# Patient Record
Sex: Female | Born: 1935
Health system: Southern US, Community
[De-identification: ages and names within clinical notes are randomized; demographics above are authoritative.]

## PROBLEM LIST (undated history)

## (undated) DIAGNOSIS — I10 Essential (primary) hypertension: Secondary | ICD-10-CM

## (undated) DIAGNOSIS — J45909 Unspecified asthma, uncomplicated: Secondary | ICD-10-CM

## (undated) DIAGNOSIS — E669 Obesity, unspecified: Secondary | ICD-10-CM

## (undated) DIAGNOSIS — E039 Hypothyroidism, unspecified: Secondary | ICD-10-CM

## (undated) DIAGNOSIS — I2699 Other pulmonary embolism without acute cor pulmonale: Secondary | ICD-10-CM

## (undated) DIAGNOSIS — M419 Scoliosis, unspecified: Secondary | ICD-10-CM

## (undated) DIAGNOSIS — G4733 Obstructive sleep apnea (adult) (pediatric): Secondary | ICD-10-CM

## (undated) DIAGNOSIS — F411 Generalized anxiety disorder: Secondary | ICD-10-CM

## (undated) DIAGNOSIS — E119 Type 2 diabetes mellitus without complications: Secondary | ICD-10-CM

## (undated) DIAGNOSIS — I509 Heart failure, unspecified: Secondary | ICD-10-CM

## (undated) DIAGNOSIS — Z9989 Dependence on other enabling machines and devices: Secondary | ICD-10-CM

## (undated) DIAGNOSIS — F3289 Other specified depressive episodes: Secondary | ICD-10-CM

## (undated) DIAGNOSIS — J9691 Respiratory failure, unspecified with hypoxia: Secondary | ICD-10-CM

## (undated) DIAGNOSIS — E7800 Pure hypercholesterolemia, unspecified: Secondary | ICD-10-CM

## (undated) DIAGNOSIS — J189 Pneumonia, unspecified organism: Secondary | ICD-10-CM

## (undated) DIAGNOSIS — F329 Major depressive disorder, single episode, unspecified: Secondary | ICD-10-CM

## (undated) DIAGNOSIS — R55 Syncope and collapse: Secondary | ICD-10-CM

## (undated) HISTORY — DX: Respiratory failure, unspecified with hypoxia: J96.91

## (undated) HISTORY — DX: Unspecified asthma, uncomplicated: J45.909

## (undated) HISTORY — PX: KIDNEY STONE SURGERY: SHX686

## (undated) HISTORY — DX: Other specified depressive episodes: F32.89

## (undated) HISTORY — DX: Syncope and collapse: R55

## (undated) HISTORY — DX: Hypothyroidism, unspecified: E03.9

## (undated) HISTORY — DX: Dependence on other enabling machines and devices: Z99.89

## (undated) HISTORY — DX: Generalized anxiety disorder: F41.1

## (undated) HISTORY — DX: Pure hypercholesterolemia, unspecified: E78.00

## (undated) HISTORY — PX: JOINT REPLACEMENT: SHX530

## (undated) HISTORY — DX: Scoliosis, unspecified: M41.9

## (undated) HISTORY — PX: THYROID SURGERY: SHX805

## (undated) HISTORY — DX: Obesity, unspecified: E66.9

## (undated) HISTORY — DX: Major depressive disorder, single episode, unspecified: F32.9

## (undated) HISTORY — DX: Obstructive sleep apnea (adult) (pediatric): G47.33

## (undated) HISTORY — DX: Type 2 diabetes mellitus without complications: E11.9

## (undated) HISTORY — PX: EYE SURGERY: SHX253

## (undated) HISTORY — DX: Essential (primary) hypertension: I10

## (undated) HISTORY — PX: OTHER SURGICAL HISTORY: SHX169

## (undated) HISTORY — DX: Pneumonia, unspecified organism: J18.9

---

## 1964-09-07 HISTORY — PX: THYROID SURGERY: SHX805

## 1999-06-24 ENCOUNTER — Encounter: Admission: RE | Admit: 1999-06-24 | Discharge: 1999-09-22 | Payer: Self-pay | Admitting: Endocrinology

## 2003-09-08 HISTORY — PX: EYE SURGERY: SHX253

## 2004-01-16 ENCOUNTER — Encounter: Admission: RE | Admit: 2004-01-16 | Discharge: 2004-01-16 | Payer: Self-pay | Admitting: *Deleted

## 2005-04-08 ENCOUNTER — Encounter: Admission: RE | Admit: 2005-04-08 | Discharge: 2005-07-07 | Payer: Self-pay | Admitting: Emergency Medicine

## 2005-07-22 ENCOUNTER — Encounter: Admission: RE | Admit: 2005-07-22 | Discharge: 2005-10-20 | Payer: Self-pay | Admitting: Physician Assistant

## 2005-11-10 ENCOUNTER — Encounter: Admission: RE | Admit: 2005-11-10 | Discharge: 2006-02-08 | Payer: Self-pay | Admitting: Physician Assistant

## 2006-05-20 ENCOUNTER — Ambulatory Visit: Payer: Self-pay | Admitting: Family Medicine

## 2006-09-06 ENCOUNTER — Ambulatory Visit: Payer: Self-pay | Admitting: Gastroenterology

## 2006-09-15 ENCOUNTER — Encounter: Admission: RE | Admit: 2006-09-15 | Discharge: 2006-09-15 | Payer: Self-pay | Admitting: Emergency Medicine

## 2006-10-08 ENCOUNTER — Ambulatory Visit: Payer: Self-pay | Admitting: Gastroenterology

## 2006-11-05 LAB — CONVERTED CEMR LAB: Pap Smear: NORMAL

## 2007-03-28 ENCOUNTER — Encounter: Admission: RE | Admit: 2007-03-28 | Discharge: 2007-03-28 | Payer: Self-pay | Admitting: Emergency Medicine

## 2007-06-30 ENCOUNTER — Ambulatory Visit: Payer: Self-pay | Admitting: Family Medicine

## 2007-07-12 ENCOUNTER — Ambulatory Visit: Payer: Self-pay | Admitting: Family Medicine

## 2007-07-15 ENCOUNTER — Encounter: Payer: Self-pay | Admitting: Family Medicine

## 2007-07-19 ENCOUNTER — Ambulatory Visit: Payer: Self-pay | Admitting: Family Medicine

## 2007-07-26 ENCOUNTER — Ambulatory Visit: Payer: Self-pay | Admitting: Family Medicine

## 2007-08-02 ENCOUNTER — Ambulatory Visit: Payer: Self-pay | Admitting: Family Medicine

## 2007-08-23 ENCOUNTER — Ambulatory Visit: Payer: Self-pay | Admitting: Family Medicine

## 2007-09-06 ENCOUNTER — Ambulatory Visit: Payer: Self-pay | Admitting: Family Medicine

## 2007-09-20 ENCOUNTER — Ambulatory Visit: Payer: Self-pay | Admitting: Family Medicine

## 2007-10-07 LAB — HM COLONOSCOPY: HM Colonoscopy: NORMAL

## 2007-10-13 ENCOUNTER — Ambulatory Visit: Payer: Self-pay | Admitting: Family Medicine

## 2007-10-18 ENCOUNTER — Ambulatory Visit: Payer: Self-pay | Admitting: Family Medicine

## 2007-11-01 ENCOUNTER — Ambulatory Visit: Payer: Self-pay | Admitting: Family Medicine

## 2008-04-10 ENCOUNTER — Ambulatory Visit: Payer: Self-pay | Admitting: Family Medicine

## 2008-04-10 ENCOUNTER — Encounter: Payer: Self-pay | Admitting: Pulmonary Disease

## 2008-04-10 ENCOUNTER — Encounter (INDEPENDENT_AMBULATORY_CARE_PROVIDER_SITE_OTHER): Payer: Self-pay | Admitting: *Deleted

## 2008-04-11 ENCOUNTER — Encounter (INDEPENDENT_AMBULATORY_CARE_PROVIDER_SITE_OTHER): Payer: Self-pay | Admitting: *Deleted

## 2008-04-20 ENCOUNTER — Encounter (INDEPENDENT_AMBULATORY_CARE_PROVIDER_SITE_OTHER): Payer: Self-pay | Admitting: *Deleted

## 2008-04-22 ENCOUNTER — Encounter (INDEPENDENT_AMBULATORY_CARE_PROVIDER_SITE_OTHER): Payer: Self-pay | Admitting: *Deleted

## 2008-09-07 DIAGNOSIS — J9691 Respiratory failure, unspecified with hypoxia: Secondary | ICD-10-CM

## 2008-09-07 HISTORY — DX: Respiratory failure, unspecified with hypoxia: J96.91

## 2008-09-12 ENCOUNTER — Encounter (INDEPENDENT_AMBULATORY_CARE_PROVIDER_SITE_OTHER): Payer: Self-pay | Admitting: *Deleted

## 2008-09-27 ENCOUNTER — Ambulatory Visit: Payer: Self-pay | Admitting: Internal Medicine

## 2008-09-27 ENCOUNTER — Ambulatory Visit: Payer: Self-pay | Admitting: *Deleted

## 2008-09-27 ENCOUNTER — Ambulatory Visit: Payer: Self-pay | Admitting: Diagnostic Radiology

## 2008-09-27 ENCOUNTER — Encounter: Payer: Self-pay | Admitting: Emergency Medicine

## 2008-09-27 ENCOUNTER — Inpatient Hospital Stay (HOSPITAL_COMMUNITY): Admission: AD | Admit: 2008-09-27 | Discharge: 2008-10-05 | Payer: Self-pay | Admitting: Internal Medicine

## 2008-09-27 ENCOUNTER — Ambulatory Visit: Payer: Self-pay | Admitting: Cardiology

## 2008-09-27 DIAGNOSIS — R55 Syncope and collapse: Secondary | ICD-10-CM | POA: Insufficient documentation

## 2008-09-27 DIAGNOSIS — E039 Hypothyroidism, unspecified: Secondary | ICD-10-CM | POA: Insufficient documentation

## 2008-09-27 DIAGNOSIS — J309 Allergic rhinitis, unspecified: Secondary | ICD-10-CM | POA: Insufficient documentation

## 2008-09-27 DIAGNOSIS — F329 Major depressive disorder, single episode, unspecified: Secondary | ICD-10-CM

## 2008-09-27 DIAGNOSIS — R0989 Other specified symptoms and signs involving the circulatory and respiratory systems: Secondary | ICD-10-CM | POA: Insufficient documentation

## 2008-09-27 DIAGNOSIS — I10 Essential (primary) hypertension: Secondary | ICD-10-CM | POA: Insufficient documentation

## 2008-09-27 DIAGNOSIS — E7800 Pure hypercholesterolemia, unspecified: Secondary | ICD-10-CM | POA: Insufficient documentation

## 2008-09-27 DIAGNOSIS — J45909 Unspecified asthma, uncomplicated: Secondary | ICD-10-CM | POA: Insufficient documentation

## 2008-09-27 DIAGNOSIS — F339 Major depressive disorder, recurrent, unspecified: Secondary | ICD-10-CM | POA: Insufficient documentation

## 2008-09-27 DIAGNOSIS — R0609 Other forms of dyspnea: Secondary | ICD-10-CM

## 2008-09-27 DIAGNOSIS — F410 Panic disorder [episodic paroxysmal anxiety] without agoraphobia: Secondary | ICD-10-CM | POA: Insufficient documentation

## 2008-09-27 DIAGNOSIS — R0602 Shortness of breath: Secondary | ICD-10-CM | POA: Insufficient documentation

## 2008-09-27 DIAGNOSIS — E669 Obesity, unspecified: Secondary | ICD-10-CM | POA: Insufficient documentation

## 2008-09-28 ENCOUNTER — Ambulatory Visit: Payer: Self-pay | Admitting: Vascular Surgery

## 2008-09-28 ENCOUNTER — Encounter: Payer: Self-pay | Admitting: Internal Medicine

## 2008-10-05 ENCOUNTER — Encounter: Payer: Self-pay | Admitting: Internal Medicine

## 2008-10-11 ENCOUNTER — Ambulatory Visit: Payer: Self-pay | Admitting: Internal Medicine

## 2008-10-11 DIAGNOSIS — I2699 Other pulmonary embolism without acute cor pulmonale: Secondary | ICD-10-CM | POA: Insufficient documentation

## 2008-10-12 ENCOUNTER — Telehealth: Payer: Self-pay | Admitting: Internal Medicine

## 2008-10-15 ENCOUNTER — Encounter (INDEPENDENT_AMBULATORY_CARE_PROVIDER_SITE_OTHER): Payer: Self-pay | Admitting: *Deleted

## 2008-10-17 ENCOUNTER — Telehealth (INDEPENDENT_AMBULATORY_CARE_PROVIDER_SITE_OTHER): Payer: Self-pay | Admitting: *Deleted

## 2008-10-24 ENCOUNTER — Encounter (INDEPENDENT_AMBULATORY_CARE_PROVIDER_SITE_OTHER): Payer: Self-pay | Admitting: *Deleted

## 2008-10-26 ENCOUNTER — Ambulatory Visit: Payer: Self-pay | Admitting: *Deleted

## 2008-10-26 DIAGNOSIS — G4730 Sleep apnea, unspecified: Secondary | ICD-10-CM | POA: Insufficient documentation

## 2008-10-30 ENCOUNTER — Telehealth (INDEPENDENT_AMBULATORY_CARE_PROVIDER_SITE_OTHER): Payer: Self-pay | Admitting: *Deleted

## 2008-10-30 ENCOUNTER — Ambulatory Visit (HOSPITAL_BASED_OUTPATIENT_CLINIC_OR_DEPARTMENT_OTHER): Admission: RE | Admit: 2008-10-30 | Discharge: 2008-10-30 | Payer: Self-pay | Admitting: Internal Medicine

## 2008-10-30 ENCOUNTER — Ambulatory Visit: Payer: Self-pay | Admitting: Diagnostic Radiology

## 2008-11-01 ENCOUNTER — Telehealth (INDEPENDENT_AMBULATORY_CARE_PROVIDER_SITE_OTHER): Payer: Self-pay | Admitting: *Deleted

## 2008-11-08 ENCOUNTER — Ambulatory Visit: Payer: Self-pay | Admitting: *Deleted

## 2008-11-08 DIAGNOSIS — R609 Edema, unspecified: Secondary | ICD-10-CM | POA: Insufficient documentation

## 2008-11-08 LAB — CONVERTED CEMR LAB
BUN: 21 mg/dL (ref 6–23)
CO2: 32 meq/L (ref 19–32)
Calcium: 9.3 mg/dL (ref 8.4–10.5)
Chloride: 104 meq/L (ref 96–112)
Creatinine, Ser: 0.9 mg/dL (ref 0.4–1.2)
GFR calc Af Amer: 79 mL/min
GFR calc non Af Amer: 66 mL/min
Glucose, Bld: 99 mg/dL (ref 70–99)
Potassium: 4.1 meq/L (ref 3.5–5.1)
Sodium: 141 meq/L (ref 135–145)
TSH: 3.84 microintl units/mL (ref 0.35–5.50)

## 2008-11-15 ENCOUNTER — Telehealth (INDEPENDENT_AMBULATORY_CARE_PROVIDER_SITE_OTHER): Payer: Self-pay | Admitting: *Deleted

## 2008-11-15 ENCOUNTER — Ambulatory Visit: Payer: Self-pay | Admitting: Critical Care Medicine

## 2008-11-21 ENCOUNTER — Encounter (INDEPENDENT_AMBULATORY_CARE_PROVIDER_SITE_OTHER): Payer: Self-pay | Admitting: *Deleted

## 2008-11-23 ENCOUNTER — Telehealth (INDEPENDENT_AMBULATORY_CARE_PROVIDER_SITE_OTHER): Payer: Self-pay | Admitting: *Deleted

## 2008-11-23 DIAGNOSIS — R0902 Hypoxemia: Secondary | ICD-10-CM | POA: Insufficient documentation

## 2008-12-02 ENCOUNTER — Encounter: Payer: Self-pay | Admitting: Critical Care Medicine

## 2008-12-13 ENCOUNTER — Ambulatory Visit: Payer: Self-pay | Admitting: *Deleted

## 2008-12-13 DIAGNOSIS — F411 Generalized anxiety disorder: Secondary | ICD-10-CM | POA: Insufficient documentation

## 2008-12-17 ENCOUNTER — Ambulatory Visit: Payer: Self-pay | Admitting: Pulmonary Disease

## 2008-12-21 ENCOUNTER — Encounter: Payer: Self-pay | Admitting: Pulmonary Disease

## 2009-01-01 ENCOUNTER — Telehealth: Payer: Self-pay | Admitting: Critical Care Medicine

## 2009-01-07 ENCOUNTER — Ambulatory Visit: Payer: Self-pay | Admitting: Internal Medicine

## 2009-01-07 LAB — CONVERTED CEMR LAB
ALT: 25 units/L (ref 0–35)
AST: 31 units/L (ref 0–37)
Albumin: 3.9 g/dL (ref 3.5–5.2)
Alkaline Phosphatase: 57 units/L (ref 39–117)
BUN: 19 mg/dL (ref 6–23)
Bilirubin, Direct: 0 mg/dL (ref 0.0–0.3)
CO2: 29 meq/L (ref 19–32)
Calcium: 9.2 mg/dL (ref 8.4–10.5)
Chloride: 106 meq/L (ref 96–112)
Cholesterol: 351 mg/dL — ABNORMAL HIGH (ref 0–200)
Creatinine, Ser: 1 mg/dL (ref 0.4–1.2)
Creatinine,U: 66.8 mg/dL
Direct LDL: 201.1 mg/dL
GFR calc non Af Amer: 58.02 mL/min (ref >60)
Glucose, Bld: 158 mg/dL — ABNORMAL HIGH (ref 70–99)
HDL: 41.4 mg/dL (ref >39.00)
Hgb A1c MFr Bld: 7 % — ABNORMAL HIGH (ref 4.6–6.5)
Microalb Creat Ratio: 16.5 mg/g (ref 0.0–30.0)
Microalb, Ur: 1.1 mg/dL (ref 0.0–1.9)
Potassium: 4.3 meq/L (ref 3.5–5.1)
Sodium: 141 meq/L (ref 135–145)
TSH: 2.46 microintl units/mL (ref 0.35–5.50)
Total Bilirubin: 0.9 mg/dL (ref 0.3–1.2)
Total CHOL/HDL Ratio: 8
Total Protein: 7.8 g/dL (ref 6.0–8.3)
Triglycerides: 487 mg/dL — ABNORMAL HIGH (ref 0.0–149.0)
VLDL: 97.4 mg/dL — ABNORMAL HIGH (ref 0.0–40.0)
Vit D, 1,25-Dihydroxy: 20 — ABNORMAL LOW (ref 30–89)

## 2009-01-10 ENCOUNTER — Telehealth: Payer: Self-pay | Admitting: Internal Medicine

## 2009-01-17 ENCOUNTER — Telehealth: Payer: Self-pay | Admitting: Internal Medicine

## 2009-01-22 ENCOUNTER — Telehealth: Payer: Self-pay | Admitting: Internal Medicine

## 2009-01-28 ENCOUNTER — Encounter: Payer: Self-pay | Admitting: Internal Medicine

## 2009-01-30 ENCOUNTER — Encounter: Payer: Self-pay | Admitting: Pulmonary Disease

## 2009-02-05 ENCOUNTER — Ambulatory Visit: Payer: Self-pay | Admitting: Critical Care Medicine

## 2009-02-12 ENCOUNTER — Encounter: Payer: Self-pay | Admitting: Internal Medicine

## 2009-02-14 ENCOUNTER — Ambulatory Visit: Payer: Self-pay | Admitting: Internal Medicine

## 2009-02-14 LAB — CONVERTED CEMR LAB
BUN: 31 mg/dL — ABNORMAL HIGH (ref 6–23)
CO2: 30 meq/L (ref 19–32)
Calcium: 9 mg/dL (ref 8.4–10.5)
Chloride: 102 meq/L (ref 96–112)
Creatinine, Ser: 1.2 mg/dL (ref 0.4–1.2)
GFR calc non Af Amer: 47 mL/min (ref >60)
Glucose, Bld: 127 mg/dL — ABNORMAL HIGH (ref 70–99)
Potassium: 4 meq/L (ref 3.5–5.1)
Sodium: 140 meq/L (ref 135–145)

## 2009-02-18 ENCOUNTER — Ambulatory Visit: Payer: Self-pay | Admitting: Internal Medicine

## 2009-02-19 ENCOUNTER — Encounter: Payer: Self-pay | Admitting: Internal Medicine

## 2009-03-04 ENCOUNTER — Telehealth: Payer: Self-pay | Admitting: Internal Medicine

## 2009-03-05 ENCOUNTER — Ambulatory Visit: Payer: Self-pay | Admitting: Pulmonary Disease

## 2009-03-06 ENCOUNTER — Ambulatory Visit: Payer: Self-pay | Admitting: Hematology & Oncology

## 2009-03-08 ENCOUNTER — Encounter: Payer: Self-pay | Admitting: Critical Care Medicine

## 2009-03-12 ENCOUNTER — Encounter: Payer: Self-pay | Admitting: Critical Care Medicine

## 2009-03-18 ENCOUNTER — Telehealth (INDEPENDENT_AMBULATORY_CARE_PROVIDER_SITE_OTHER): Payer: Self-pay | Admitting: *Deleted

## 2009-03-22 ENCOUNTER — Ambulatory Visit (HOSPITAL_BASED_OUTPATIENT_CLINIC_OR_DEPARTMENT_OTHER): Admission: RE | Admit: 2009-03-22 | Discharge: 2009-03-22 | Payer: Self-pay | Admitting: Hematology & Oncology

## 2009-03-22 ENCOUNTER — Ambulatory Visit: Payer: Self-pay | Admitting: Family Medicine

## 2009-03-22 ENCOUNTER — Ambulatory Visit: Payer: Self-pay | Admitting: Diagnostic Radiology

## 2009-03-23 ENCOUNTER — Ambulatory Visit: Payer: Self-pay | Admitting: Hematology

## 2009-03-29 LAB — PROTIME-INR
INR: 2.9 (ref 2.00–3.50)
Protime: 34.8 Seconds — ABNORMAL HIGH (ref 10.6–13.4)

## 2009-04-05 LAB — PROTIME-INR

## 2009-04-05 LAB — PROTHROMBIN TIME
INR: 4.7 — ABNORMAL HIGH (ref 0.0–1.5)
Prothrombin Time: 48.3 seconds — ABNORMAL HIGH (ref 11.6–15.2)

## 2009-04-08 LAB — PROTIME-INR
INR: 2.5 (ref 2.00–3.50)
Protime: 30 Seconds — ABNORMAL HIGH (ref 10.6–13.4)

## 2009-04-09 ENCOUNTER — Ambulatory Visit: Payer: Self-pay | Admitting: Critical Care Medicine

## 2009-04-10 ENCOUNTER — Telehealth: Payer: Self-pay | Admitting: Internal Medicine

## 2009-04-15 LAB — PROTIME-INR
INR: 2.7 (ref 2.00–3.50)
Protime: 32.4 Seconds — ABNORMAL HIGH (ref 10.6–13.4)

## 2009-04-19 ENCOUNTER — Ambulatory Visit: Payer: Self-pay | Admitting: Hematology & Oncology

## 2009-04-22 LAB — PROTIME-INR (CHCC SATELLITE)
INR: 3.2 (ref 2.0–3.5)
Protime: 38.4 Seconds — ABNORMAL HIGH (ref 10.6–13.4)

## 2009-04-29 ENCOUNTER — Ambulatory Visit: Payer: Self-pay | Admitting: Hematology

## 2009-04-29 LAB — PROTIME-INR
INR: 3.7 — ABNORMAL HIGH (ref 2.00–3.50)
Protime: 44.4 Seconds — ABNORMAL HIGH (ref 10.6–13.4)

## 2009-05-06 ENCOUNTER — Encounter: Payer: Self-pay | Admitting: Critical Care Medicine

## 2009-05-06 LAB — CBC WITH DIFFERENTIAL (CANCER CENTER ONLY)
BASO#: 0 10*3/uL (ref 0.0–0.2)
BASO%: 0.5 % (ref 0.0–2.0)
EOS%: 4.4 % (ref 0.0–7.0)
Eosinophils Absolute: 0.2 10*3/uL (ref 0.0–0.5)
HCT: 33.3 % — ABNORMAL LOW (ref 34.8–46.6)
HGB: 11.3 g/dL — ABNORMAL LOW (ref 11.6–15.9)
LYMPH#: 1.3 10*3/uL (ref 0.9–3.3)
LYMPH%: 23.6 % (ref 14.0–48.0)
MCH: 30.7 pg (ref 26.0–34.0)
MCHC: 34 g/dL (ref 32.0–36.0)
MCV: 90 fL (ref 81–101)
MONO#: 0.4 10*3/uL (ref 0.1–0.9)
MONO%: 6.6 % (ref 0.0–13.0)
NEUT#: 3.5 10*3/uL (ref 1.5–6.5)
NEUT%: 64.9 % (ref 39.6–80.0)
Platelets: 186 10*3/uL (ref 145–400)
RBC: 3.69 10*6/uL — ABNORMAL LOW (ref 3.70–5.32)
RDW: 15.3 % — ABNORMAL HIGH (ref 10.5–14.6)
WBC: 5.4 10*3/uL (ref 3.9–10.0)

## 2009-05-06 LAB — PROTIME-INR (CHCC SATELLITE)
INR: 2.9 (ref 2.0–3.5)
Protime: 34.8 Seconds — ABNORMAL HIGH (ref 10.6–13.4)

## 2009-05-07 ENCOUNTER — Telehealth: Payer: Self-pay | Admitting: Internal Medicine

## 2009-05-20 ENCOUNTER — Ambulatory Visit: Payer: Self-pay | Admitting: Hematology & Oncology

## 2009-05-20 ENCOUNTER — Ambulatory Visit: Payer: Self-pay | Admitting: Internal Medicine

## 2009-05-20 LAB — PROTIME-INR (CHCC SATELLITE)
INR: 2.8 (ref 2.0–3.5)
Protime: 33.6 Seconds — ABNORMAL HIGH (ref 10.6–13.4)

## 2009-05-21 ENCOUNTER — Ambulatory Visit: Payer: Self-pay | Admitting: Internal Medicine

## 2009-05-21 LAB — CONVERTED CEMR LAB

## 2009-05-24 ENCOUNTER — Encounter: Payer: Self-pay | Admitting: Internal Medicine

## 2009-05-27 ENCOUNTER — Encounter: Payer: Self-pay | Admitting: Internal Medicine

## 2009-05-28 ENCOUNTER — Encounter: Payer: Self-pay | Admitting: Internal Medicine

## 2009-05-31 ENCOUNTER — Ambulatory Visit: Payer: Self-pay | Admitting: Pulmonary Disease

## 2009-06-03 LAB — PROTIME-INR (CHCC SATELLITE)
INR: 3.2 (ref 2.0–3.5)
Protime: 38.4 Seconds — ABNORMAL HIGH (ref 10.6–13.4)

## 2009-06-04 ENCOUNTER — Ambulatory Visit: Payer: Self-pay | Admitting: Critical Care Medicine

## 2009-06-11 ENCOUNTER — Telehealth: Payer: Self-pay | Admitting: Internal Medicine

## 2009-06-13 ENCOUNTER — Ambulatory Visit: Payer: Self-pay | Admitting: Internal Medicine

## 2009-06-13 DIAGNOSIS — IMO0001 Reserved for inherently not codable concepts without codable children: Secondary | ICD-10-CM | POA: Insufficient documentation

## 2009-06-13 DIAGNOSIS — M549 Dorsalgia, unspecified: Secondary | ICD-10-CM | POA: Insufficient documentation

## 2009-06-13 LAB — CONVERTED CEMR LAB: Total CK: 75 units/L (ref 7–177)

## 2009-06-14 ENCOUNTER — Telehealth: Payer: Self-pay | Admitting: Internal Medicine

## 2009-06-17 ENCOUNTER — Encounter: Payer: Self-pay | Admitting: Critical Care Medicine

## 2009-06-17 LAB — PROTIME-INR (CHCC SATELLITE)
INR: 3.2 (ref 2.0–3.5)
Protime: 38.4 Seconds — ABNORMAL HIGH (ref 10.6–13.4)

## 2009-06-17 LAB — CBC WITH DIFFERENTIAL (CANCER CENTER ONLY)
BASO#: 0 10*3/uL (ref 0.0–0.2)
BASO%: 0.6 % (ref 0.0–2.0)
EOS%: 4.4 % (ref 0.0–7.0)
Eosinophils Absolute: 0.2 10*3/uL (ref 0.0–0.5)
HCT: 35.9 % (ref 34.8–46.6)
HGB: 12.1 g/dL (ref 11.6–15.9)
LYMPH#: 1.4 10*3/uL (ref 0.9–3.3)
LYMPH%: 25.1 % (ref 14.0–48.0)
MCH: 30.4 pg (ref 26.0–34.0)
MCHC: 33.6 g/dL (ref 32.0–36.0)
MCV: 91 fL (ref 81–101)
MONO#: 0.5 10*3/uL (ref 0.1–0.9)
MONO%: 8.4 % (ref 0.0–13.0)
NEUT#: 3.3 10*3/uL (ref 1.5–6.5)
NEUT%: 61.5 % (ref 39.6–80.0)
Platelets: 181 10*3/uL (ref 145–400)
RBC: 3.96 10*6/uL (ref 3.70–5.32)
RDW: 15 % — ABNORMAL HIGH (ref 10.5–14.6)
WBC: 5.4 10*3/uL (ref 3.9–10.0)

## 2009-07-03 ENCOUNTER — Ambulatory Visit: Payer: Self-pay | Admitting: Hematology & Oncology

## 2009-07-03 ENCOUNTER — Encounter: Payer: Self-pay | Admitting: Internal Medicine

## 2009-07-15 ENCOUNTER — Encounter: Payer: Self-pay | Admitting: Internal Medicine

## 2009-07-15 LAB — PROTIME-INR (CHCC SATELLITE)
INR: 2.8 (ref 2.0–3.5)
Protime: 33.6 Seconds — ABNORMAL HIGH (ref 10.6–13.4)

## 2009-08-07 LAB — HM DIABETES EYE EXAM: HM Diabetic Eye Exam: NORMAL

## 2009-08-09 ENCOUNTER — Ambulatory Visit: Payer: Self-pay | Admitting: Hematology & Oncology

## 2009-08-12 ENCOUNTER — Encounter: Payer: Self-pay | Admitting: Critical Care Medicine

## 2009-08-12 LAB — PROTIME-INR (CHCC SATELLITE)
INR: 3.2 (ref 2.0–3.5)
Protime: 38.4 Seconds — ABNORMAL HIGH (ref 10.6–13.4)

## 2009-08-16 ENCOUNTER — Ambulatory Visit: Payer: Self-pay | Admitting: Internal Medicine

## 2009-08-16 LAB — CONVERTED CEMR LAB
ALT: 25 units/L (ref 0–35)
AST: 32 units/L (ref 0–37)
Albumin: 3.9 g/dL (ref 3.5–5.2)
Alkaline Phosphatase: 55 units/L (ref 39–117)
BUN: 22 mg/dL (ref 6–23)
Bilirubin, Direct: 0.1 mg/dL (ref 0.0–0.3)
CO2: 28 meq/L (ref 19–32)
Calcium: 9 mg/dL (ref 8.4–10.5)
Chloride: 104 meq/L (ref 96–112)
Cholesterol: 148 mg/dL (ref 0–200)
Creatinine, Ser: 1 mg/dL (ref 0.4–1.2)
GFR calc non Af Amer: 57.92 mL/min (ref >60)
Glucose, Bld: 148 mg/dL — ABNORMAL HIGH (ref 70–99)
HDL: 44.8 mg/dL (ref >39.00)
Hgb A1c MFr Bld: 6.8 % — ABNORMAL HIGH (ref 4.6–6.5)
LDL Cholesterol: 65 mg/dL (ref 0–99)
Potassium: 4.1 meq/L (ref 3.5–5.1)
Sodium: 140 meq/L (ref 135–145)
Total Bilirubin: 0.8 mg/dL (ref 0.3–1.2)
Total CHOL/HDL Ratio: 3
Total Protein: 7.3 g/dL (ref 6.0–8.3)
Triglycerides: 190 mg/dL — ABNORMAL HIGH (ref 0.0–149.0)
VLDL: 38 mg/dL (ref 0.0–40.0)

## 2009-08-19 ENCOUNTER — Ambulatory Visit: Payer: Self-pay | Admitting: Internal Medicine

## 2009-08-19 LAB — HM DIABETES FOOT EXAM

## 2009-08-20 ENCOUNTER — Encounter: Admission: RE | Admit: 2009-08-20 | Discharge: 2009-08-20 | Payer: Self-pay | Admitting: Hematology & Oncology

## 2009-09-13 ENCOUNTER — Ambulatory Visit: Payer: Self-pay | Admitting: Hematology & Oncology

## 2009-09-20 ENCOUNTER — Encounter: Payer: Self-pay | Admitting: Internal Medicine

## 2009-09-20 LAB — CBC WITH DIFFERENTIAL (CANCER CENTER ONLY)
BASO#: 0 10e3/uL (ref 0.0–0.2)
BASO%: 0.6 % (ref 0.0–2.0)
EOS%: 4 % (ref 0.0–7.0)
Eosinophils Absolute: 0.2 10e3/uL (ref 0.0–0.5)
HCT: 36 % (ref 34.8–46.6)
HGB: 12.1 g/dL (ref 11.6–15.9)
LYMPH#: 1.3 10e3/uL (ref 0.9–3.3)
LYMPH%: 24.1 % (ref 14.0–48.0)
MCH: 30 pg (ref 26.0–34.0)
MCHC: 33.7 g/dL (ref 32.0–36.0)
MCV: 89 fL (ref 81–101)
MONO#: 0.4 10e3/uL (ref 0.1–0.9)
MONO%: 7.3 % (ref 0.0–13.0)
NEUT#: 3.4 10e3/uL (ref 1.5–6.5)
NEUT%: 64 % (ref 39.6–80.0)
Platelets: 190 10e3/uL (ref 145–400)
RBC: 4.03 10e6/uL (ref 3.70–5.32)
RDW: 14.9 % — ABNORMAL HIGH (ref 10.5–14.6)
WBC: 5.3 10e3/uL (ref 3.9–10.0)

## 2009-09-20 LAB — PROTIME-INR (CHCC SATELLITE)
INR: 1.8 — ABNORMAL LOW (ref 2.0–3.5)
Protime: 21.6 s — ABNORMAL HIGH (ref 10.6–13.4)

## 2009-09-23 ENCOUNTER — Ambulatory Visit: Payer: Self-pay | Admitting: Internal Medicine

## 2009-09-23 DIAGNOSIS — J069 Acute upper respiratory infection, unspecified: Secondary | ICD-10-CM | POA: Insufficient documentation

## 2009-09-27 ENCOUNTER — Telehealth: Payer: Self-pay | Admitting: Internal Medicine

## 2009-09-30 ENCOUNTER — Ambulatory Visit (HOSPITAL_BASED_OUTPATIENT_CLINIC_OR_DEPARTMENT_OTHER): Admission: RE | Admit: 2009-09-30 | Discharge: 2009-09-30 | Payer: Self-pay | Admitting: Internal Medicine

## 2009-09-30 ENCOUNTER — Ambulatory Visit: Payer: Self-pay | Admitting: Internal Medicine

## 2009-09-30 ENCOUNTER — Ambulatory Visit: Payer: Self-pay | Admitting: Radiology

## 2009-09-30 DIAGNOSIS — R05 Cough: Secondary | ICD-10-CM

## 2009-09-30 DIAGNOSIS — R059 Cough, unspecified: Secondary | ICD-10-CM | POA: Insufficient documentation

## 2009-09-30 LAB — CBC WITH DIFFERENTIAL (CANCER CENTER ONLY)
BASO#: 0 10*3/uL (ref 0.0–0.2)
BASO%: 0.5 % (ref 0.0–2.0)
EOS%: 4.3 % (ref 0.0–7.0)
Eosinophils Absolute: 0.3 10*3/uL (ref 0.0–0.5)
HCT: 34.6 % — ABNORMAL LOW (ref 34.8–46.6)
HGB: 11.6 g/dL (ref 11.6–15.9)
LYMPH#: 1.7 10*3/uL (ref 0.9–3.3)
LYMPH%: 21.6 % (ref 14.0–48.0)
MCH: 30.2 pg (ref 26.0–34.0)
MCHC: 33.6 g/dL (ref 32.0–36.0)
MCV: 90 fL (ref 81–101)
MONO#: 0.5 10*3/uL (ref 0.1–0.9)
MONO%: 7 % (ref 0.0–13.0)
NEUT#: 5.1 10*3/uL (ref 1.5–6.5)
NEUT%: 66.6 % (ref 39.6–80.0)
Platelets: 222 10*3/uL (ref 145–400)
RBC: 3.85 10*6/uL (ref 3.70–5.32)
RDW: 15.1 % — ABNORMAL HIGH (ref 10.5–14.6)
WBC: 7.7 10*3/uL (ref 3.9–10.0)

## 2009-09-30 LAB — PROTIME-INR (CHCC SATELLITE)
INR: 1.9 — ABNORMAL LOW (ref 2.0–3.5)
Protime: 22.8 Seconds — ABNORMAL HIGH (ref 10.6–13.4)

## 2009-10-03 ENCOUNTER — Telehealth: Payer: Self-pay | Admitting: Internal Medicine

## 2009-10-08 LAB — PROTIME-INR (CHCC SATELLITE)
INR: 1.7 — ABNORMAL LOW (ref 2.0–3.5)
Protime: 20.4 Seconds — ABNORMAL HIGH (ref 10.6–13.4)

## 2009-10-21 ENCOUNTER — Ambulatory Visit: Payer: Self-pay | Admitting: Hematology & Oncology

## 2009-10-21 LAB — PROTIME-INR (CHCC SATELLITE)
INR: 2.3 (ref 2.0–3.5)
Protime: 27.6 Seconds — ABNORMAL HIGH (ref 10.6–13.4)

## 2009-10-22 LAB — HM MAMMOGRAPHY: HM Mammogram: NORMAL

## 2009-11-11 ENCOUNTER — Encounter: Payer: Self-pay | Admitting: Internal Medicine

## 2009-11-14 LAB — PROTIME-INR (CHCC SATELLITE)
INR: 2.7 (ref 2.0–3.5)
Protime: 32.4 Seconds — ABNORMAL HIGH (ref 10.6–13.4)

## 2010-01-01 ENCOUNTER — Ambulatory Visit: Payer: Self-pay | Admitting: Hematology & Oncology

## 2010-01-02 ENCOUNTER — Ambulatory Visit: Payer: Self-pay | Admitting: Internal Medicine

## 2010-01-02 LAB — CBC WITH DIFFERENTIAL (CANCER CENTER ONLY)
BASO#: 0 10*3/uL (ref 0.0–0.2)
BASO%: 0.5 % (ref 0.0–2.0)
EOS%: 4.6 % (ref 0.0–7.0)
Eosinophils Absolute: 0.3 10*3/uL (ref 0.0–0.5)
HCT: 33.8 % — ABNORMAL LOW (ref 34.8–46.6)
HGB: 11.6 g/dL (ref 11.6–15.9)
LYMPH#: 1.4 10*3/uL (ref 0.9–3.3)
LYMPH%: 25 % (ref 14.0–48.0)
MCH: 31.1 pg (ref 26.0–34.0)
MCHC: 34.2 g/dL (ref 32.0–36.0)
MCV: 91 fL (ref 81–101)
MONO#: 0.4 10*3/uL (ref 0.1–0.9)
MONO%: 6.9 % (ref 0.0–13.0)
NEUT#: 3.4 10*3/uL (ref 1.5–6.5)
NEUT%: 63 % (ref 39.6–80.0)
Platelets: 197 10*3/uL (ref 145–400)
RBC: 3.72 10*6/uL (ref 3.70–5.32)
RDW: 14.2 % (ref 10.5–14.6)
WBC: 5.4 10*3/uL (ref 3.9–10.0)

## 2010-01-02 LAB — PROTIME-INR (CHCC SATELLITE)
INR: 1.8 — ABNORMAL LOW (ref 2.0–3.5)
Protime: 21.6 Seconds — ABNORMAL HIGH (ref 10.6–13.4)

## 2010-01-03 LAB — VITAMIN D 25 HYDROXY (VIT D DEFICIENCY, FRACTURES): Vit D, 25-Hydroxy: 34 ng/mL (ref 30–89)

## 2010-01-03 LAB — FERRITIN: Ferritin: 53 ng/mL (ref 10–291)

## 2010-01-16 ENCOUNTER — Ambulatory Visit: Payer: Self-pay | Admitting: Internal Medicine

## 2010-01-16 DIAGNOSIS — L259 Unspecified contact dermatitis, unspecified cause: Secondary | ICD-10-CM | POA: Insufficient documentation

## 2010-01-16 LAB — PROTIME-INR (CHCC SATELLITE)
INR: 2.5 (ref 2.0–3.5)
Protime: 30 Seconds — ABNORMAL HIGH (ref 10.6–13.4)

## 2010-02-11 ENCOUNTER — Ambulatory Visit: Payer: Self-pay | Admitting: Hematology & Oncology

## 2010-03-12 ENCOUNTER — Ambulatory Visit: Payer: Self-pay | Admitting: Hematology & Oncology

## 2010-03-13 LAB — PROTIME-INR (CHCC SATELLITE)
INR: 4.1 — ABNORMAL HIGH (ref 2.0–3.5)
Protime: 49.2 Seconds — ABNORMAL HIGH (ref 10.6–13.4)

## 2010-03-24 LAB — PROTIME-INR (CHCC SATELLITE)
INR: 1.3 — ABNORMAL LOW (ref 2.0–3.5)
Protime: 15.6 Seconds — ABNORMAL HIGH (ref 10.6–13.4)

## 2010-03-31 ENCOUNTER — Ambulatory Visit: Payer: Self-pay | Admitting: Internal Medicine

## 2010-03-31 LAB — PROTIME-INR (CHCC SATELLITE)
INR: 1.7 — ABNORMAL LOW (ref 2.0–3.5)
Protime: 20.4 Seconds — ABNORMAL HIGH (ref 10.6–13.4)

## 2010-04-01 ENCOUNTER — Telehealth: Payer: Self-pay | Admitting: Internal Medicine

## 2010-04-18 ENCOUNTER — Ambulatory Visit: Payer: Self-pay | Admitting: Hematology & Oncology

## 2010-04-21 ENCOUNTER — Encounter: Payer: Self-pay | Admitting: Internal Medicine

## 2010-04-21 LAB — PROTIME-INR (CHCC SATELLITE)
INR: 2.1 (ref 2.0–3.5)
Protime: 25.2 Seconds — ABNORMAL HIGH (ref 10.6–13.4)

## 2010-04-21 LAB — CBC WITH DIFFERENTIAL (CANCER CENTER ONLY)
BASO#: 0 10*3/uL (ref 0.0–0.2)
BASO%: 0.4 % (ref 0.0–2.0)
EOS%: 5 % (ref 0.0–7.0)
Eosinophils Absolute: 0.3 10*3/uL (ref 0.0–0.5)
HCT: 35.1 % (ref 34.8–46.6)
HGB: 12.1 g/dL (ref 11.6–15.9)
LYMPH#: 1.4 10*3/uL (ref 0.9–3.3)
LYMPH%: 21.9 % (ref 14.0–48.0)
MCH: 31.1 pg (ref 26.0–34.0)
MCHC: 34.5 g/dL (ref 32.0–36.0)
MCV: 90 fL (ref 81–101)
MONO#: 0.4 10*3/uL (ref 0.1–0.9)
MONO%: 6.8 % (ref 0.0–13.0)
NEUT#: 4.1 10*3/uL (ref 1.5–6.5)
NEUT%: 65.9 % (ref 39.6–80.0)
Platelets: 203 10*3/uL (ref 145–400)
RBC: 3.89 10*6/uL (ref 3.70–5.32)
RDW: 14.2 % (ref 10.5–14.6)
WBC: 6.3 10*3/uL (ref 3.9–10.0)

## 2010-04-21 LAB — COMPREHENSIVE METABOLIC PANEL
ALT: 22 U/L (ref 0–35)
AST: 24 U/L (ref 0–37)
Albumin: 4.4 g/dL (ref 3.5–5.2)
Alkaline Phosphatase: 60 U/L (ref 39–117)
BUN: 38 mg/dL — ABNORMAL HIGH (ref 6–23)
CO2: 26 mEq/L (ref 19–32)
Calcium: 9.8 mg/dL (ref 8.4–10.5)
Chloride: 97 mEq/L (ref 96–112)
Creatinine, Ser: 1.38 mg/dL — ABNORMAL HIGH (ref 0.40–1.20)
Glucose, Bld: 272 mg/dL — ABNORMAL HIGH (ref 70–99)
Potassium: 3.9 mEq/L (ref 3.5–5.3)
Sodium: 139 mEq/L (ref 135–145)
Total Bilirubin: 0.6 mg/dL (ref 0.3–1.2)
Total Protein: 7.1 g/dL (ref 6.0–8.3)

## 2010-04-21 LAB — FERRITIN: Ferritin: 78 ng/mL (ref 10–291)

## 2010-04-23 ENCOUNTER — Encounter: Payer: Self-pay | Admitting: Pulmonary Disease

## 2010-04-24 ENCOUNTER — Encounter: Payer: Self-pay | Admitting: Internal Medicine

## 2010-05-02 ENCOUNTER — Ambulatory Visit: Payer: Self-pay | Admitting: Internal Medicine

## 2010-05-02 LAB — CONVERTED CEMR LAB
BUN: 30 mg/dL — ABNORMAL HIGH (ref 6–23)
CO2: 29 meq/L (ref 19–32)
Calcium: 8.6 mg/dL (ref 8.4–10.5)
Chloride: 99 meq/L (ref 96–112)
Creatinine, Ser: 1.2 mg/dL (ref 0.4–1.2)
Creatinine,U: 118 mg/dL
GFR calc non Af Amer: 45.52 mL/min (ref >60)
Glucose, Bld: 273 mg/dL — ABNORMAL HIGH (ref 70–99)
Hgb A1c MFr Bld: 10 % — ABNORMAL HIGH (ref 4.6–6.5)
Microalb Creat Ratio: 5.8 mg/g (ref 0.0–30.0)
Microalb, Ur: 6.9 mg/dL — ABNORMAL HIGH (ref 0.0–1.9)
Potassium: 4.2 meq/L (ref 3.5–5.1)
Sodium: 138 meq/L (ref 135–145)
TSH: 2.58 microintl units/mL (ref 0.35–5.50)

## 2010-05-09 ENCOUNTER — Ambulatory Visit: Payer: Self-pay | Admitting: Internal Medicine

## 2010-05-20 ENCOUNTER — Telehealth: Payer: Self-pay | Admitting: Internal Medicine

## 2010-05-23 ENCOUNTER — Ambulatory Visit: Payer: Self-pay | Admitting: Hematology & Oncology

## 2010-06-02 LAB — PROTIME-INR (CHCC SATELLITE)
INR: 2.2 (ref 2.0–3.5)
Protime: 26.4 Seconds — ABNORMAL HIGH (ref 10.6–13.4)

## 2010-06-08 ENCOUNTER — Ambulatory Visit: Payer: Self-pay | Admitting: Vascular Surgery

## 2010-06-08 ENCOUNTER — Encounter (INDEPENDENT_AMBULATORY_CARE_PROVIDER_SITE_OTHER): Payer: Self-pay | Admitting: Emergency Medicine

## 2010-06-08 ENCOUNTER — Inpatient Hospital Stay (HOSPITAL_COMMUNITY): Admission: EM | Admit: 2010-06-08 | Discharge: 2010-06-18 | Payer: Self-pay | Admitting: Emergency Medicine

## 2010-06-08 ENCOUNTER — Ambulatory Visit: Payer: Self-pay | Admitting: Internal Medicine

## 2010-06-09 ENCOUNTER — Telehealth: Payer: Self-pay | Admitting: Internal Medicine

## 2010-06-20 ENCOUNTER — Telehealth: Payer: Self-pay | Admitting: Internal Medicine

## 2010-06-23 ENCOUNTER — Ambulatory Visit: Payer: Self-pay | Admitting: Internal Medicine

## 2010-06-23 ENCOUNTER — Ambulatory Visit: Payer: Self-pay | Admitting: Diagnostic Radiology

## 2010-06-23 ENCOUNTER — Ambulatory Visit (HOSPITAL_BASED_OUTPATIENT_CLINIC_OR_DEPARTMENT_OTHER): Admission: RE | Admit: 2010-06-23 | Discharge: 2010-06-23 | Payer: Self-pay | Admitting: Internal Medicine

## 2010-06-23 DIAGNOSIS — M109 Gout, unspecified: Secondary | ICD-10-CM | POA: Insufficient documentation

## 2010-06-23 DIAGNOSIS — J189 Pneumonia, unspecified organism: Secondary | ICD-10-CM | POA: Insufficient documentation

## 2010-06-23 DIAGNOSIS — R5381 Other malaise: Secondary | ICD-10-CM | POA: Insufficient documentation

## 2010-06-23 LAB — CONVERTED CEMR LAB
BUN: 33 mg/dL — ABNORMAL HIGH (ref 6–23)
CO2: 30 meq/L (ref 19–32)
Calcium: 9.5 mg/dL (ref 8.4–10.5)
Chloride: 98 meq/L (ref 96–112)
Creatinine, Ser: 1.22 mg/dL — ABNORMAL HIGH (ref 0.40–1.20)
Glucose, Bld: 213 mg/dL — ABNORMAL HIGH (ref 70–99)
INR: 2.31 — ABNORMAL HIGH (ref <1.50)
Potassium: 4 meq/L (ref 3.5–5.3)
Pro B Natriuretic peptide (BNP): 62.1 pg/mL (ref 0.0–100.0)
Prothrombin Time: 25.5 s — ABNORMAL HIGH (ref 11.6–15.2)
Sodium: 141 meq/L (ref 135–145)
TSH: 2.03 microintl units/mL (ref 0.350–4.500)
Uric Acid, Serum: 9 mg/dL — ABNORMAL HIGH (ref 2.4–7.0)

## 2010-06-24 ENCOUNTER — Telehealth: Payer: Self-pay | Admitting: Internal Medicine

## 2010-06-24 ENCOUNTER — Telehealth (INDEPENDENT_AMBULATORY_CARE_PROVIDER_SITE_OTHER): Payer: Self-pay | Admitting: *Deleted

## 2010-06-25 ENCOUNTER — Encounter: Payer: Self-pay | Admitting: Internal Medicine

## 2010-06-25 ENCOUNTER — Ambulatory Visit: Payer: Self-pay | Admitting: Hematology & Oncology

## 2010-06-26 ENCOUNTER — Encounter: Payer: Self-pay | Admitting: Internal Medicine

## 2010-06-26 LAB — PROTIME-INR (CHCC SATELLITE)
INR: 2.5 (ref 2.0–3.5)
Protime: 30 Seconds — ABNORMAL HIGH (ref 10.6–13.4)

## 2010-06-30 ENCOUNTER — Telehealth: Payer: Self-pay | Admitting: Internal Medicine

## 2010-07-01 ENCOUNTER — Encounter: Payer: Self-pay | Admitting: Pulmonary Disease

## 2010-07-02 ENCOUNTER — Telehealth: Payer: Self-pay | Admitting: Pulmonary Disease

## 2010-07-04 ENCOUNTER — Ambulatory Visit: Payer: Self-pay | Admitting: Pulmonary Disease

## 2010-07-09 ENCOUNTER — Encounter: Payer: Self-pay | Admitting: Pulmonary Disease

## 2010-07-09 ENCOUNTER — Telehealth: Payer: Self-pay | Admitting: Pulmonary Disease

## 2010-07-09 ENCOUNTER — Ambulatory Visit: Payer: Self-pay | Admitting: Internal Medicine

## 2010-07-10 ENCOUNTER — Encounter: Payer: Self-pay | Admitting: Internal Medicine

## 2010-07-16 ENCOUNTER — Encounter: Payer: Self-pay | Admitting: Internal Medicine

## 2010-07-21 LAB — PROTIME-INR (CHCC SATELLITE)
INR: 2.6 (ref 2.0–3.5)
Protime: 31.2 Seconds — ABNORMAL HIGH (ref 10.6–13.4)

## 2010-07-22 ENCOUNTER — Encounter: Payer: Self-pay | Admitting: Pulmonary Disease

## 2010-07-25 ENCOUNTER — Encounter: Payer: Self-pay | Admitting: Internal Medicine

## 2010-07-28 ENCOUNTER — Ambulatory Visit: Payer: Self-pay | Admitting: Pulmonary Disease

## 2010-07-29 ENCOUNTER — Encounter: Payer: Self-pay | Admitting: Internal Medicine

## 2010-08-05 ENCOUNTER — Ambulatory Visit: Payer: Self-pay | Admitting: Internal Medicine

## 2010-08-05 DIAGNOSIS — N184 Chronic kidney disease, stage 4 (severe): Secondary | ICD-10-CM

## 2010-08-05 DIAGNOSIS — E1122 Type 2 diabetes mellitus with diabetic chronic kidney disease: Secondary | ICD-10-CM | POA: Insufficient documentation

## 2010-08-05 DIAGNOSIS — E119 Type 2 diabetes mellitus without complications: Secondary | ICD-10-CM | POA: Insufficient documentation

## 2010-08-05 DIAGNOSIS — Z794 Long term (current) use of insulin: Secondary | ICD-10-CM

## 2010-08-05 LAB — CONVERTED CEMR LAB
BUN: 27 mg/dL — ABNORMAL HIGH (ref 6–23)
Bilirubin Urine: NEGATIVE
Blood in Urine, dipstick: NEGATIVE
CO2: 25 meq/L (ref 19–32)
Calcium: 9.4 mg/dL (ref 8.4–10.5)
Chloride: 93 meq/L — ABNORMAL LOW (ref 96–112)
Creatinine, Ser: 1.22 mg/dL — ABNORMAL HIGH (ref 0.40–1.20)
Glucose, Bld: 388 mg/dL — ABNORMAL HIGH (ref 70–99)
Glucose, Urine, Semiquant: >=1000
Ketones, urine, test strip: NEGATIVE
Nitrite: NEGATIVE
Potassium: 4.1 meq/L (ref 3.5–5.3)
Protein, U semiquant: NEGATIVE
Sodium: 135 meq/L (ref 135–145)
Specific Gravity, Urine: 1.01
Urobilinogen, UA: 0.2
pH: 5

## 2010-08-06 ENCOUNTER — Encounter: Payer: Self-pay | Admitting: Internal Medicine

## 2010-08-14 ENCOUNTER — Telehealth: Payer: Self-pay | Admitting: Pulmonary Disease

## 2010-08-15 ENCOUNTER — Ambulatory Visit: Payer: Self-pay | Admitting: Hematology & Oncology

## 2010-08-18 ENCOUNTER — Encounter: Payer: Self-pay | Admitting: Internal Medicine

## 2010-08-18 LAB — PROTIME-INR (CHCC SATELLITE)
INR: 2.2 (ref 2.0–3.5)
Protime: 26.4 Seconds — ABNORMAL HIGH (ref 10.6–13.4)

## 2010-08-18 LAB — CBC WITH DIFFERENTIAL (CANCER CENTER ONLY)
BASO#: 0 10*3/uL (ref 0.0–0.2)
BASO%: 0.5 % (ref 0.0–2.0)
EOS%: 4.5 % (ref 0.0–7.0)
Eosinophils Absolute: 0.3 10*3/uL (ref 0.0–0.5)
HCT: 34.6 % — ABNORMAL LOW (ref 34.8–46.6)
HGB: 11.4 g/dL — ABNORMAL LOW (ref 11.6–15.9)
LYMPH#: 1.5 10*3/uL (ref 0.9–3.3)
LYMPH%: 25.1 % (ref 14.0–48.0)
MCH: 30.1 pg (ref 26.0–34.0)
MCHC: 33 g/dL (ref 32.0–36.0)
MCV: 91 fL (ref 81–101)
MONO#: 0.4 10*3/uL (ref 0.1–0.9)
MONO%: 7.1 % (ref 0.0–13.0)
NEUT#: 3.8 10*3/uL (ref 1.5–6.5)
NEUT%: 62.8 % (ref 39.6–80.0)
Platelets: 189 10*3/uL (ref 145–400)
RBC: 3.79 10*6/uL (ref 3.70–5.32)
RDW: 14.9 % — ABNORMAL HIGH (ref 10.5–14.6)
WBC: 6.1 10*3/uL (ref 3.9–10.0)

## 2010-08-18 LAB — HEMOGLOBIN A1C
Hgb A1c MFr Bld: 9.4 % — ABNORMAL HIGH (ref <5.7)
Mean Plasma Glucose: 223 mg/dL — ABNORMAL HIGH (ref <117)

## 2010-08-19 LAB — D-DIMER, QUANTITATIVE: D-Dimer, Quant: 0.25 ug/mL-FEU (ref 0.00–0.48)

## 2010-09-16 ENCOUNTER — Other Ambulatory Visit: Payer: Self-pay | Admitting: Internal Medicine

## 2010-09-16 ENCOUNTER — Ambulatory Visit
Admission: RE | Admit: 2010-09-16 | Discharge: 2010-09-16 | Payer: Self-pay | Source: Home / Self Care | Attending: Internal Medicine | Admitting: Internal Medicine

## 2010-09-16 LAB — HEMOGLOBIN A1C: Hgb A1c MFr Bld: 9.9 % — ABNORMAL HIGH (ref 4.6–6.5)

## 2010-09-16 LAB — BASIC METABOLIC PANEL
BUN: 26 mg/dL — ABNORMAL HIGH (ref 6–23)
CO2: 31 mEq/L (ref 19–32)
Calcium: 9.6 mg/dL (ref 8.4–10.5)
Chloride: 101 mEq/L (ref 96–112)
Creatinine, Ser: 1.1 mg/dL (ref 0.4–1.2)
GFR: 53.99 mL/min — ABNORMAL LOW (ref >60.00)
Glucose, Bld: 107 mg/dL — ABNORMAL HIGH (ref 70–99)
Potassium: 4 mEq/L (ref 3.5–5.1)
Sodium: 141 mEq/L (ref 135–145)

## 2010-09-16 LAB — URIC ACID: Uric Acid, Serum: 5.8 mg/dL (ref 2.4–7.0)

## 2010-09-17 ENCOUNTER — Ambulatory Visit
Admission: RE | Admit: 2010-09-17 | Discharge: 2010-09-17 | Payer: Self-pay | Source: Home / Self Care | Attending: Internal Medicine | Admitting: Internal Medicine

## 2010-09-22 ENCOUNTER — Ambulatory Visit
Admission: RE | Admit: 2010-09-22 | Discharge: 2010-09-22 | Payer: Self-pay | Source: Home / Self Care | Attending: Critical Care Medicine | Admitting: Critical Care Medicine

## 2010-09-22 ENCOUNTER — Ambulatory Visit
Admission: RE | Admit: 2010-09-22 | Discharge: 2010-09-22 | Payer: Self-pay | Source: Home / Self Care | Attending: Pulmonary Disease | Admitting: Pulmonary Disease

## 2010-09-22 DIAGNOSIS — J209 Acute bronchitis, unspecified: Secondary | ICD-10-CM | POA: Insufficient documentation

## 2010-09-28 ENCOUNTER — Encounter: Payer: Self-pay | Admitting: Internal Medicine

## 2010-10-05 LAB — CONVERTED CEMR LAB
ALT: 32 units/L
AST: 35 units/L
Albumin: 4.3 g/dL
Alkaline Phosphatase: 70 units/L
BUN: 19 mg/dL
Bilirubin, Direct: 0.1 mg/dL
CO2: 29 meq/L
Calcium: 9.1 mg/dL
Chloride: 102 meq/L
Cholesterol: 173 mg/dL
Creatinine, Ser: 1.06 mg/dL
Free T4: 0.83 ng/dL
GFR calc non Af Amer: 65.57 mL/min
Glucose, Bld: 144 mg/dL
HDL: 49 mg/dL
Hgb A1c MFr Bld: 7.7 %
LDL Cholesterol: 73 mg/dL
Potassium: 4.2 meq/L
Sodium: 144 meq/L
TSH: 2.539 microintl units/mL
Total Bilirubin: 0.4 mg/dL
Total Protein: 7 g/dL
Triglycerides: 253 mg/dL
VLDL: 51 mg/dL

## 2010-10-09 NOTE — Miscellaneous (Signed)
Summary: Care Plan/[facility]  Care Plan/[facility]   Imported By: Edmonia James 07/19/2010 10:01:59  _____________________________________________________________________  External Attachment:    Type:   Image     Comment:   External Document

## 2010-10-09 NOTE — Assessment & Plan Note (Signed)
Summary: 3 month rov-ch   Vital Signs:  Patient profile:   76 year old female Weight:      260.50 pounds BMI:     47.82 Temp:     98.4 degrees F oral Pulse rate:   86 / minute Pulse rhythm:   regular Resp:     20 per minute BP sitting:   152 / 70  (right arm) Cuff size:   large  Vitals Entered By: Jiles Garter CMA (May 21, 2009 2:21 PM)  Primary Care Provider:  Jennings Books DO  CC:  Follow up Disease Management and Type 2 diabetes mellitus follow-up.  History of Present Illness: Follow up disease management  Type 2 Diabetes Mellitus Follow-Up      This is a 76 year old woman who presents for Type 2 diabetes mellitus follow-up.  The patient denies self managed hypoglycemia and hypoglycemia requiring help.  The patient denies the following symptoms: chest pain.  Since the last visit the patient reports fair dietary compliance, not exercising regularly, and monitoring blood glucose.  She was prev seen by Dr. Valetta Close for hyperglycemia.  She could not tolerated glipizide secondary to abd pain.   Higher dose of metformin also caused GI upset.  Htn - stable.  She reports BP at home [id number] to [id number] systolic.  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  Hx of PE - going for weekly coumadin checks with Dr. Marin Olp.  Hyperlipidemia - she is not tolerating lipitor.  Increases musculoskeletal complaints.  She did not have these symptoms when she was on crestor.  Allergies: 1)  ! Sulfa 2)  ! Tetracycline 3)  ! Aspirin 4)  ! * Hormones 5)  ! Advair Diskus (Fluticasone-Salmeterol) 6)  ! Lisinopril 7)  ! * Latex 8)  Glucophage  Past History:  Past Medical History: Current Problems:  HYPOTHYROIDISM (ICD-244.9) HYPERCHOLESTEROLEMIA (ICD-272.0)  HYPERTENSION (ICD-401.9) SYNCOPE AND COLLAPSE (ICD-780.2) DIABETES MELLITUS (ICD-250.00) DEPRESSION (ICD-311)  ASTHMA, CHILDHOOD (ICD-493.00)  - no problem since adulthood Allergic rhinitis obesity  panic  attacks / anxiety Acute hypoxic respiratory failure secondary to multiple bilateral pulmonary embolism 09/2008 Negative hypercoagulable workup 09/2008 hospital stay Severe obstructive sleep apnea   Past Surgical History: thyroid removed   Family History: Family History of Arthritis Family History Diabetes 1st degree relative Family History High cholesterol Family History Hypertension Family History of  CAD Family History of  stroke Family History of  emotional/mental illness daughters-arthritis, allergies mother-asthma and allergies, clotting disorder, arthritis mat grandmother- clotting dx, rheumatism mat uncle-clotting dx  daughter-clotting dx, allergies    Social History: 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 Former Smoker Quit smoking 1991.  Smoked off and on x 20 yrs. Alcohol use-no Drug use-no      Physical Exam  General:  alert and overweight-appearing.   Neck:  no masses.  thick neck circumferenceno carotid bruits.   Lungs:  normal respiratory effort and normal breath sounds.   Heart:  normal rate, regular rhythm, and no gallop.   Abdomen:  soft, non-tender, and normal bowel sounds.   Extremities:  trace left pedal edema and trace right pedal edema.    Diabetes Management Exam:    Foot Exam (with socks and/or shoes not present):       Inspection:          Left foot: normal          Right foot: normal   Impression & Recommendations:  Problem # 1:  DIABETES MELLITUS (ICD-250.00) Glipizide DC'ed secondary to abd discomfort.  High dose of metformin caused GI side effects.  I would like to titrate metformin to 1500 mg / day.    Pt advised to take addt'l metformin before noon meal.  A1c pending.    The following medications were removed from the medication list:    Glipizide Xl 5 Mg Xr24h-tab (Glipizide) .Marland Kitchen... 1 tab by mouth qam with breakfast Her updated medication list for this problem includes:    Actos 45 Mg Tabs  (Pioglitazone hcl) .Marland Kitchen... Take 1/2 tablet by mouth once a day    Janumet 50-500 Mg Tabs (Sitagliptin-metformin hcl) .Marland Kitchen... Take 1 tablet by mouth two times a day    Micardis Hct 40-12.5 Mg Tabs (Telmisartan-hctz) ..... One by mouth once daily    Metformin Hcl 500 Mg Tabs (Metformin hcl) ..... One by mouth with afternoon meal  Problem # 2:  PULMONARY EMBOLISM (ICD-415.19) Followed by Dr. Marin Olp and now on coumadin.   He recommends anticoagulation x 2 yrs.  Her updated medication list for this problem includes:    Coumadin 5 Mg Tabs (Warfarin sodium) .Marland Kitchen... As directed  Problem # 3:  HYPERTENSION (ICD-401.9) Pt reports BP 130's at home.    Monitor for now.  Consider increase Hct portion of micardis.  Her updated medication list for this problem includes:    Micardis Hct 40-12.5 Mg Tabs (Telmisartan-hctz) ..... One by mouth once daily  BP today: 152/70 Prior BP: 124/48 (04/09/2009)  Labs Reviewed: K+: 4.0 (02/14/2009) Creat: : 1.2 (02/14/2009)   Chol: 351 (01/07/2009)   HDL: 41.40 (01/07/2009)   TG: 487.0 (01/07/2009)  Complete Medication List: 1)  Synthroid 100 Mcg Tabs (Levothyroxine sodium) .... Take 1 tablet by mouth once a day 2)  Fexofenadine Hcl 180 Mg Tabs (Fexofenadine hcl) .... Take 1 tablet by mouth once a day 3)  Sertraline Hcl 100 Mg Tabs (Sertraline hcl) .... One by mouth once daily 4)  Actos 45 Mg Tabs (Pioglitazone hcl) .... Take 1/2 tablet by mouth once a day 5)  Janumet 50-500 Mg Tabs (Sitagliptin-metformin hcl) .... Take 1 tablet by mouth two times a day 6)  Micardis Hct 40-12.5 Mg Tabs (Telmisartan-hctz) .... One by mouth once daily 7)  Zantac 150 Maximum Strength 150 Mg Tabs (Ranitidine hcl) .... One tab by mouth once daily as needed 8)  Multivitamins Tabs (Multiple vitamin) .... One by mouth once daily 9)  Vitamin D 2000 Unit Caps (Cholecalciferol) .... Take 1 capsule by mouth once a day 10)  Fish Oil 1000 Mg Caps (Omega-3 fatty acids) .... Take 2  tablet by mouth  two times a day 11)  Crestor 20 Mg Tabs (Rosuvastatin calcium) .... One by mouth qd 12)  Cpap  .Marland Kitchen.. 13 13)  Coumadin 5 Mg Tabs (Warfarin sodium) .... As directed 14)  Metformin Hcl 500 Mg Tabs (Metformin hcl) .... One by mouth with afternoon meal  Patient Instructions: 1)  Please schedule a follow-up appointment in 3 months. 2)  BMP prior to visit, ICD-9: 401.9 3)  Hepatic Panel prior to visit, ICD-9: 272.4 4)  Lipid Panel prior to visit, ICD-9: 272.4 5)  HbgA1C prior to visit, ICD-9:  250.00 6)  Please return for fasting  lab work one (1) week before your next appointment. (Schedule at [facility] location ) Prescriptions: CRESTOR 20 MG TABS (ROSUVASTATIN CALCIUM) one by mouth qd  #30 x 5   Entered and Authorized by:   D. Drema Pry DO  Signed by:   D. Drema Pry DO on 05/21/2009   Method used:   Electronically to        [facility] # [phone]* (retail)       [address]       [place], [place]  [address]       Ph: [phone]       Fax: [phone]   RxID:   [id number] METFORMIN HCL 500 MG TABS (METFORMIN HCL) one by mouth with afternoon meal  #30 x 5   Entered and Authorized by:   D. Drema Pry DO   Signed by:   D. Drema Pry DO on 05/21/2009   Method used:   Electronically to        [facility] [address] # [phone]* (retail)       [address]       [place], [place]  [address]       Ph: [phone]       Fax: [phone]   RxID:   [id number]    Preventive Care Screening  Pap Smear:    Date:  05/21/2009    Results:  Declined to have  Mammogram:    Date:  10/30/2008    Results:  normal   Bone Density:    Date:  08/21/2008    Results:  normal std dev

## 2010-10-09 NOTE — Discharge Summary (Signed)
Summary: Hospital Discharge Summary  NAME:  [name], GERGELY NO.:  [id number]      MEDICAL RECORD NO.:  [id number]          PATIENT TYPE:  INP      LOCATION:  5022                         FACILITY:  [facility]      PHYSICIAN:  Darrick Penna. Hopper, MD,FACP,FCCPDATE OF BIRTH:  1937-06-[birth year]      DATE OF ADMISSION:  09/27/2008   DATE OF DISCHARGE:  10/05/2008                                  DISCHARGE SUMMARY      DISCHARGE DIAGNOSES:   1. Acute hypoxic respiratory failure in setting of bilateral pulmonary       emboli.   2. History of hypertension with low blood pressure throughout       admission.   3. Anxiety and depression.   4. Dyslipidemia.      HISTORY OF PRESENT ILLNESS:  [name] is a 76 year old white female   with multiple medical problems who was at [facility] on day of   admission to establish primary care with Dr. Jadene Pierini.  The patient   complained of shortness of breath beginning on day prior to office visit   associated with some dizziness and worse on exertion.  The patient   denied any chest pain, syncope, or cough.  No fever or chills.  Upon   evaluation in the office, the patient had difficulty completing   sentences secondary to shortness of breath and found to have oxygen   saturation of 68% on room air at which time the patient was sent to the   hospital for further evaluation and treatment.  CT angio of the chest   performed at time of admission revealed multiple bilateral pulmonary   emboli with moderate-to-large clot burdens.  The patient was admitted at   that time for further evaluation and treatment.  Of note, daughter with   history of DVTs, on oral contraceptives and question whether or not the   patient's pulmonary emboli secondary to hypercoagulopathy, also on   herbal progesterone at time of admission.      PAST MEDICAL HISTORY:   1. Type 2 diabetes.   2. Hypertension.   3. Dyslipidemia.   4. Depression.   5.  Hypothyroidism status post thyroidectomy and radiation therapy.   6. Obstructive sleep apnea, on home CPAP.   7. Negative colon cancer screening.   8. Normal mammogram, 2008.      CONSULTATIONS DURING THIS ADMISSION:   1. [facility], Dr. Asencion Noble.   2. Psychiatry, Dr. Felizardo Hoffmann.      COURSE OF HOSPITALIZATION:   1. Acute hypoxic respiratory failure in setting of bilateral PEs.       Again, the patient noted to be hypoxic at time of admission.       Initially, the patient was admitted to Step-Down Unit and placed on       supplemental oxygen as well as IV heparin and Coumadin per pharmacy       protocol.  Again, the patient on herbal progesterone treatment at       time of admission which  she has been instructed to discontinue as       it may have contributed to hypercoagulable state.  In addition,       hypercoagulable panel revealed protein C level slightly elevated at       179, otherwise within normal limits.  At this time, Coumadin and       heparin have been discontinued as the patient has opted for       treatment with Lowella Curb study drug, rivaroxaban managed by [facility].  The patient was maintaining adequate oxygen saturation       on 2 L of nasal cannula at 98-100% while at rest.  However, upon       ambulation on room air, the patient does drop below 90%.       Therefore, we will discharge home with continued oxygen therapy to       be weaned off by primary care physician.   2. History of hypertension with low blood pressure throughout       admission.  The patient with no clinical signs of intravascular       depletion or dehydration throughout admission.  However, blood       pressure has remained quite soft off all antihypertensive       medications.  We will hold spironolactone and Avapro at time of       discharge with close outpatient monitoring of the patient's blood       pressure to determine need for resuming medications.  However,  it       appears that the patient has been overtreated prior to this       admission for her blood pressure.   3. Anxiety and depression.  The patient with history of anxiety and       depression prior to this admission being treated with low-dose       SSRI, however, the patient has expressed great anxiety about going       home as well as about the diagnoses of PEs.  Dr. Rhona Raider was       asked to see the patient in consultation who has recommended       increasing the patient's Zoloft to 75 mg x4 days with increase to       100 mg on October 08, 2008.  Dr. Rhona Raider has also recommended       outpatient psychiatry followup.  The patient at this time plans on       following up with her counselor as well as a psychiatrist       appointment at [facility].  Please see Dr.       Daylene Katayama complete dictation for further details.   4. Dyslipidemia.  The patient on Lovaza and rice yeast prior to       admission.  However, fasting lipid profile obtained during this       admission revealed a total cholesterol of 256, triglycerides of       302, LDL of 163, and HDL of 33.  The patient was started on statin       therapy during this admission to be followed outpatient by primary       care physician.  Note, liver function tests performed during this       admission are within normal limits.      DISCHARGE MEDICATIONS:   1. Synthroid 100 mcg p.o. daily.   2. Lovaza  1 gram p.o. daily.   3. Actos 45 mg p.o. daily.   4. Fish oil 1000 mg p.o. daily.   5. Advair 250/50 one inhalation twice daily.   6. Crestor 10 mg p.o. daily.  Note, this is a new medication.   7. Einstein study drug, rivaroxaban taken as directed.   8. Zoloft 75 mg until October 07, 2008, then increase to 100 mg p.o.       daily.   9. The patient instructed to discontinue Avapro and spironolactone.      PERTINENT DISCHARGE LABORATORY DATA:  White cell count 9.1, platelet   count 217, hemoglobin 12.1, and  hematocrit 36.1.  Sodium 134, potassium   4.5, and creatinine 1.20.  Hemoglobin A1c 7.1.  TSH 2.756.  Total   cholesterol 256, triglycerides 302, HDL 33, and LDL 163.   Hypercoagulable panel reveals functional protein C level of 179,   otherwise within normal limits.      DISPOSITION:  The patient felt medically stable for discharge home at   this time.  The patient has been arranged for a followup with Dr. Drema Pry as her primary care physician is out of the office next week on   Thursday, October 11, 2008 at 9:15 a.m. at which time it can be   determined if the patient's oxygen can be weaned.      TIME SPENT:  Greater than 30 minutes were spent on discharge planning.               Patrici Ranks, NP               Darrick Penna. Linna Darner, MD,FACP,FCCP            LE/MEDQ  D:  10/05/2008  T:  10/06/2008  Job:  [id number]      cc:   Sandy Salaam. Shawna Orleans, DO   Orinda Kenner, MD

## 2010-10-09 NOTE — Assessment & Plan Note (Signed)
Summary: hfu..ok per JR//jrc   Visit Type:  Hospital Follow-up Copy to:  Dr. Asencion Noble Primary Provider/Referring Provider:  D. Drema Pry DO  CC:  c/o decreased energy and SOB with exertion. Pt states finished  PT yesterday. Request to have CPAP and O2 through same supplier.  History of Present Illness: [name] with BL idiopathic PE 1/10 & severe obstructive sleep apnea presents for post hospital FU. On nocturnal O2 & CPAP, now on O2 daytime too. PE occurred while on Pro-gon B (hormone supplement, sub lingual) DIagnostic PSG in 8/09 showed severe obstructive sleep apnea with AHI 85/h, TST 121 mins - no REM sleep. Subsequent titration study showed CPAP requirement of 15 cm with small comfort gel mask Arville Go). However she felt that this pressure was too high & was not very compliant with CPAP. After PE, oximetry showed desaturation to 77% & oxygen 2 L [place] was started  [facility]). Desaturation (without CPAP) could be on the basis of PE or obstructive sleep apnea .  3/10>> autoCPAP 5-15 with 3 L O2 blended in If 90% pressure is indeed as high as 15 then we may have to leave her on automode.  6/29 >> . CPAP is with [facility] ([facility]) & O2 with [facility]. No desaturation with exertion today.  Reviewed ONO >> desatn less than 88% for 35 mins, sustained during ? REM sleep, ct O2  with CPAP during sleep  July 04, 2010 9:35 AM  Last OV 9/10,Download shows  poor compliance on CPAP until aug'11. She wants to switch to [facility] from [facility]  - they supply O2, loaner from [facility] , pr down to 10 cm Hospitalised 10/2-13/11 for pneumnia, hypoxia CXR - improved BL ASD Desatn onexertion  Pt denies any significant sore throat, nasal congestion or excess secretions, fever, chills, sweats, unintended weight loss, pleurtic or exertional chest pain, orthopnea PND, or leg swelling      Preventive Screening-Counseling & Management  Alcohol-Tobacco     Smoking Status: quit     Year Quit: 1991     Pack years: 46yrs x  3/4ppd  Caffeine-Diet-Exercise     Caffeine use/day: 2  Current Medications (verified): 1)  Synthroid 100 Mcg Tabs (Levothyroxine Sodium) .... Take 1 Tablet By Mouth Once A Day 2)  Fexofenadine Hcl 180 Mg Tabs (Fexofenadine Hcl) .... Take 1 Tablet By Mouth Once A Day 3)  Sertraline Hcl 50 Mg Tabs (Sertraline Hcl) .... Take 1 Tablet By Mouth Once A Day 4)  Benicar 20 Mg Tabs (Olmesartan Medoxomil) .... One By Mouth Once Daily 5)  Zantac 150 Maximum Strength 150 Mg Tabs (Ranitidine Hcl) .... One Tab By Mouth Once Daily As Needed 6)  Multivitamins  Tabs (Multiple Vitamin) .... One By Mouth Once Daily 7)  Vitamin D 2000 Unit Caps (Cholecalciferol) .... Take 1 Capsule By Mouth Once A Day 8)  Fish Oil 1000 Mg Caps (Omega-3 Fatty Acids) .... Take 2  Tablet By Mouth Two Times A Day 9)  Crestor 20 Mg Tabs (Rosuvastatin Calcium) .... Take 1 Tablet By Mouth Once A Day 10)  Cpap .Marland Kitchen.. [employer] 11)  Coumadin 5 Mg Tabs (Warfarin Sodium) .... As Directed 12)  Co Q-10 200 Mg Caps (Coenzyme Q10) .... Take 1 Tablet By Mouth Once A Day 13)  Nateglinide 60 Mg Tabs (Nateglinide) .... One Tab At [facility] Before Meals As Needed 14)  Furosemide 20 Mg Tabs (Furosemide) .... 3 Tabs By Mouth Once Daily 15)  Pen Needles 1/2" 29g X 9mm Misc (Insulin  Pen Needle) .... Use Once Daily As Directed 16)  Prednisone 10 Mg Tabs (Prednisone) .... One By Mouth Once Daily 17)  Novolog Mix 70/30 Flexpen 70-30 % Susp (Insulin Aspart Prot & Aspart) .... 30 Units Two Times A Day  Allergies (verified): 1)  ! Sulfa 2)  ! Tetracycline 3)  ! Aspirin 4)  ! * Hormones 5)  ! Advair Diskus (Fluticasone-Salmeterol) 6)  ! Lisinopril 7)  ! * Latex 8)  Glucophage 9)  * Ivp Dye  Past History:  Past Medical History: Last updated: 05/09/2010 Current Problems:  HYPOTHYROIDISM (ICD-244.9)  HYPERCHOLESTEROLEMIA (ICD-272.0)   HYPERTENSION (ICD-401.9)   SYNCOPE AND COLLAPSE (ICD-780.2)     DIABETES MELLITUS  (ICD-250.00) DEPRESSION (ICD-311)  ASTHMA, CHILDHOOD (ICD-493.00)  - no problem since adulthood Allergic rhinitis obesity  panic attacks / anxiety Acute hypoxic respiratory failure secondary to multiple bilateral pulmonary embolism 09/2008 Negative hypercoagulable workup 09/2008 hospital stay Severe obstructive sleep apnea    Social History: Last updated: 05/09/2010 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 Former Smoker Quit smoking 1991.  Smoked off and on x 20 yrs. Alcohol use-no   Drug use-no             Review of Systems       The patient complains of dyspnea on exertion.  The patient denies anorexia, fever, weight loss, weight gain, vision loss, decreased hearing, hoarseness, chest pain, syncope, peripheral edema, prolonged cough, headaches, hemoptysis, abdominal pain, melena, hematochezia, severe indigestion/heartburn, hematuria, muscle weakness, suspicious skin lesions, difficulty walking, depression, unusual weight change, abnormal bleeding, enlarged lymph nodes, and angioedema.    Vital Signs:  Patient profile:   76 year old female Height:      62 inches Weight:      251.6 pounds BMI:     46.18 O2 Sat:      95 % on 2.5 L/min Temp:     98.8 degrees F oral Pulse rate:   70 / minute BP sitting:   118 / 80  (left arm) Cuff size:   regular  Vitals Entered By: Iran Planas CMA (July 04, 2010 9:26 AM)  O2 Flow:  2.5 L/min  Serial Vital Signs/Assessments:  Comments: Ambulatory Pulse Oximetry  Resting; HR__64___    02 Sat_93%RA____  Lap1 (185 feet)   HR__94___   02 Sat__87%RA___ Lap2 (185 feet)   HR_____   02 Sat_____    Lap3 (185 feet)   HR_____   02 Sat_____  ___Test Completed without Difficulty _x__Test Stopped due to: pt desat to 87%RA Iran Planas CMA  July 04, 2010 3:57 PM    By: Iran Planas CMA   CC: c/o decreased energy, SOB with exertion. Pt states finished  PT yesterday. Request to have CPAP and  O2 through same supplier Comments Medications reviewed with patient Verified contact number and pharmacy with patient Iran Planas [facility]  July 04, 2010 9:29 AM    Physical Exam  Additional Exam:  wt 252 July 04, 2010  Gen. Pleasant, well-nourished, in no distress, normal affect ENT - no lesions, no post nasal drip Neck: No JVD, no thyromegaly, no carotid bruits Lungs: no use of accessory muscles, no dullness to percussion, clear without rales or rhonchi  Cardiovascular: Rhythm regular, heart sounds  normal, no murmurs or gallops, 2+ peripheral edema Musculoskeletal: No deformities, no cyanosis or clubbing      CXR  Procedure date:  07/04/2010  Findings:      Comparison: 06/23/2010   Findings:  Patchy airspace disease in the lung bases bilaterally shows mild improvement.  There is residual infiltrate present which may represent pneumonia.  Negative for effusion.  Heart size is enlarged.  No definite heart failure.   IMPRESSION: Partial clearing of bilateral infiltrates.  Impression & Recommendations:  Problem # 1:  PNEUMONIA (ICD-486) Partial clearing of infiltrates - no more abx needed FU CXR in 3-4 wks for resolution ct O2 during exertion & sleep Orders: Est. Patient Level IV (99214) T-2 View CXR (71020TC)  Problem # 2:  SLEEP APNEA (ICD-780.57) decrease cpap to 10 cm hope to achieve better compliance Orders: Est. Patient Level IV [id number]) DME Referral (DME)  Medications Added to Medication List This Visit: 1)  Cpap  .Marland Kitchen.. [employer] 2)  Nateglinide 60 Mg Tabs (Nateglinide) .... One tab at noon before meals as needed 3)  Novolog Mix 70/30 Flexpen 70-30 % Susp (Insulin aspart prot & aspart) .... 30 units two times a day  Patient Instructions: 1)  Copy sent to: dr Shawna Orleans 2)  Please schedule a follow-up appointment in 3 weeks. 3)  A chest x-ray has been recommended.  Your imaging study may require preauthorization.  4)  We will check download  on your laoner machine & make pressure adjustments 5)  Ambulatory satn

## 2010-10-09 NOTE — Letter (Signed)
Summary: [name]   Imported By: Edmonia James 07/31/2009 10:54:02  _____________________________________________________________________  External Attachment:    Type:   Image     Comment:   External Document

## 2010-10-09 NOTE — Progress Notes (Signed)
Summary: Rx refill  Phone Note Refill Request Message from:  Fax from Pharmacy  Refills Requested: Medication #1:  CRESTOR 10 MG TABS Take 1 tablet by mouth once a day Initial call taken by: Marcelino Freestone RMA,  November 15, 2008 1:14 PM  Follow-up for Phone Call        Rx sent electronically to pharamcy. Follow-up by: Marcelino Freestone RMA,  November 15, 2008 1:15 PM      Prescriptions: CRESTOR 10 MG TABS (ROSUVASTATIN CALCIUM) Take 1 tablet by mouth once a day  #30 x 2   Entered by:   Marcelino Freestone RMA   Authorized by:   Orinda Kenner MD   Signed by:   Marcelino Freestone RMA on 11/15/2008   Method used:   Electronically to        [facility] # [phone]* (retail)       [address]       [place],   [address]       Ph: [phone]       Fax: [phone]   RxID:   [id number]

## 2010-10-09 NOTE — Letter (Signed)
Summary: NP/[facility]  NP/[facility]   Imported By: Phillis Knack 04/23/2009 09:15:18  _____________________________________________________________________  External Attachment:    Type:   Image     Comment:   External Document

## 2010-10-09 NOTE — Progress Notes (Signed)
Summary: [place]  Phone Note From Other Clinic Call back at [phone] ext [phone]   Caller: [place] Call For: Dr Shawna Orleans  Summary of Call: patient was hospitalized frm  10/2/-10/12  and was recently seen in the home by [facility]. After in home assessment, Alyse Low would like to know if they could get a verbal order from Dr Shawna Orleans for a bedside Comode Initial call taken by: Jiles Garter CMA,  June 20, 2010 1:58 PM  Follow-up for Phone Call        ok for verbal order for bedside commode Follow-up by: D. Drema Pry DO,  June 20, 2010 3:33 PM  Additional Follow-up for Phone Call Additional follow up Details #1::        call placed to [facility], spoke with Estill Bamberg she was provided verbal order for bedside commode per Dr Shawna Orleans instructions Additional Follow-up by: Jiles Garter CMA,  June 20, 2010 4:38 PM

## 2010-10-09 NOTE — Letter (Signed)
Summary: [facility]  [facility]   Imported By: Bubba Hales 02/05/2009 10:22:55  _____________________________________________________________________  External Attachment:    Type:   Image     Comment:   External Document

## 2010-10-09 NOTE — Letter (Signed)
Summary: [facility]  [facility]   Imported By: Edmonia James 10/18/2008 09:10:57  _____________________________________________________________________  External Attachment:    Type:   Image     Comment:   External Document

## 2010-10-09 NOTE — Assessment & Plan Note (Signed)
Summary: Pulmonary OV   Copy to:  Dr. Asencion Noble Primary Provider/Referring Provider:  Orinda Kenner MD  CC:  Follow-up pulmonary embolism.   Pt states she is having no problems..  History of Present Illness: Pulmonary OV       This is a 76 year old woman who presents for follow-up Pulmonary visit.  The patient presents with shortness of breath, but has no history of fever, fatigue, failure to thrive, weight loss, night sweats, nosebleeds, easy bruising, heartburn, nausea/vomiting, unable to take oral fluids/nourishment, chest pain, cough, shortness of breath with exertion, hemoptysis, joint swelling, leg swelling, peripheral edema, and URI symptoms.  The patient comes in today for follow-up of pulmonary embolism.  Since the last visit, the patient feels their symptoms are improving and do not impair daily activities.  The patient notes good compliance with treatment plan.  Evaluation since the last visit (see reports for full details) include no further diagnostic test.  The patient reports (see A&P and reports for details) no new problems or concerns.    Pt was admitted 1/21- 1/29 for Acute PE.  Pt enrolled into [place] trial and randomized to rivaroxaban. Pt is doing much better.  No pain.  No cough.  No wheeze.  Pt without any bleeding issues. Pt is scheduled for 75month rx program.  Had a rash on advair that went away when advair discontinued.  Notes some edema in feet.   February 05, 2009 4:24 PM last ov 3/10: The pt is doing well The pt had PE 1/10.   The pt was enrolled in [place] trial and enrolled to Rivaroxaban.  The pt is due to come off med soon  end of july 10.   There is no cough or dyspnea. There is no chest pain. Pt denies any significant sore throat, nasal congestion or excess secretions, fever, chills, sweats, unintended weight loss, pleurtic or exertional chest pain, orthopnea PND, or leg swelling Pt denies any increase in rescue therapy over baseline, denies waking  up needing it or having any early am or nocturnal exacerbations of coughing/wheezing/or dyspnea.   Current Medications (verified): 1)  Synthroid 100 Mcg Tabs (Levothyroxine Sodium) .... Take 1 Tablet By Mouth Once A Day 2)  Fexofenadine Hcl 180 Mg Tabs (Fexofenadine Hcl) .... Take 1 Tablet By Mouth Once A Day 3)  Sertraline Hcl 100 Mg Tabs (Sertraline Hcl) .... One By Mouth Once Daily 4)  Actos 45 Mg Tabs (Pioglitazone Hcl) .... Take 1/2 Tablet By Mouth Once A Day 5)  Januvia 100 Mg Tabs (Sitagliptin Phosphate) .... One By Mouth Qd 6)  Rivaroxaban - For Pulmonary Embolism Tx .... Patient Is in A Phase Iii Study With Dr. Asencion Noble 20mg  By Mouth Daily 7)  Micardis Hct 40-12.5 Mg Tabs (Telmisartan-Hctz) .... One By Mouth Once Daily 8)  Zantac 150 Maximum Strength 150 Mg Tabs (Ranitidine Hcl) .... One Tab By Mouth Once Daily As Needed 9)  Multivitamins  Tabs (Multiple Vitamin) .... One By Mouth Once Daily 10)  Vitamin D 50000 Unit Caps (Ergocalciferol) .... One By Mouth Q Wkly 11)  Fish Oil 1000 Mg Caps (Omega-3 Fatty Acids) .Marland Kitchen.. 1 By Mouth Daily  Allergies (verified): 1)  ! Sulfa 2)  ! Tetracycline 3)  ! Aspirin 4)  ! * Hormones 5)  ! Advair Diskus (Fluticasone-Salmeterol) 6)  ! Lisinopril 7)  ! Crestor (Rosuvastatin Calcium) 8)  ! * Latex 9)  Glucophage  Past History:  Past Medical History: Reviewed history from 01/07/2009 and  no changes required. Current Problems:  HYPOTHYROIDISM (ICD-244.9) HYPERCHOLESTEROLEMIA (ICD-272.0) HYPERTENSION (ICD-401.9) SYNCOPE AND COLLAPSE (ICD-780.2) DIABETES MELLITUS (ICD-250.00) DEPRESSION (ICD-311) ASTHMA, CHILDHOOD (ICD-493.00)  - no problem since adulthood Allergic rhinitis obesity panic attacks / anxiety Acute hypoxic respiratory failure secondary to multiple bilateral pulmonary embolism 09/2008 Negative hypercoagulable workup 09/2008 hospital stay sleep apnea   Review of Systems  The patient denies shortness of breath with  activity, shortness of breath at rest, productive cough, non-productive cough, coughing up blood, chest pain, irregular heartbeats, acid heartburn, indigestion, loss of appetite, weight change, abdominal pain, difficulty swallowing, sore throat, tooth/dental problems, headaches, nasal congestion/difficulty breathing through nose, sneezing, itching, ear ache, anxiety, depression, hand/feet swelling, joint stiffness or pain, rash, change in color of mucus, and fever.    Vital Signs:  Patient profile:   76 year old female Height:      63 inches (160.02 cm) Weight:      264.13 pounds (120.06 kg) BMI:     46.96 O2 Sat:      92 % on Room air Temp:     98.6 degrees F (37.00 degrees C) oral Pulse rate:   80 / minute BP sitting:   120 / 70  (left arm) Cuff size:   large  Vitals Entered By: Francesca Jewett CMA (February 05, 2009 4:19 PM)  O2 Flow:  Room air  Physical Exam  Additional Exam:  Gen. Pleasant, well-nourished, in no distress, normal affect ENT - no lesions, no post nasal drip Neck: No JVD, no thyromegaly, no carotid bruits Lungs: no use of accessory muscles, no dullness to percussion, clear without rales or rhonchi  Cardiovascular: Rhythm regular, heart sounds  normal, no murmurs or gallops, no peripheral edema Abdomen: soft and non-tender, no hepatosplenomegaly, BS normal. Musculoskeletal: No deformities, no cyanosis or clubbing Neuro:  alert, non focal     Impression & Recommendations:  Problem # 1:  PULMONARY EMBOLISM (ICD-415.19) Assessment Improved Resolved  pulmonary embolism plan: cont rivaroxaban until 7/10 no other changes  Orders: Est. Patient Level III [id number]  Medications Added to Medication List This Visit: 1)  Fish Oil 1000 Mg Caps (Omega-3 fatty acids) .Marland Kitchen.. 1 by mouth daily  Patient Instructions: 1)  No change in medications 2)  Return two months 3)  Consider water aerobics  Appended Document: Pulmonary OV fax [facility]

## 2010-10-09 NOTE — Progress Notes (Signed)
Summary: HFU/ cpap---ov with RA and also order for new cpap machine  Phone Note Call from Patient Call back at Home Phone [phone]   Caller: Patient Call For: [name]  Summary of Call: pt was just d/c'd from [facility]. wants to know when she is to return for a HFU. also wants order for cpap service (water in cpap). pt is on O2.  Initial call taken by: Cooper Render, CNA,  June 24, 2010 2:27 PM  Follow-up for Phone Call        1) pt needs a HFU with Dr. Elsworth Soho, she states they discussed this while she was in the hospital. Pt has seen PW for pulmonary and RA for sleep so I was not sure who she needed HFU with. Dr. Elsworth Soho was inthe offie at the time of call so I asked him and he said for pt to follow-up with him. Pt scheduled to see him on 07-01-10 at 3:30pm.   2) Pt states that her CPAP stopped working over the weekend and she is requesting an order be sent to [facility] for either a new cpap or for HCS to come out and work on the machine she has. Pt is currently just wearing her oxygen at night and this is causing her to have dryness in her nose and nose bleeds. Please advise if ok to send order for cpap. Thanks!  Orwell Bing CMA  June 24, 2010 4:38 PM   Additional Follow-up for Phone Call Additional follow up Details #1::        order sent Additional Follow-up by: Leanna Sato. Elsworth Soho MD,  June 24, 2010 5:18 PM    Additional Follow-up for Phone Call Additional follow up Details #2::    called and spoke with pt.  pt aware order sent to [facility] to evaluate cpap machine.  Matthew Folks LPN  October 18, 624THL 5:26 PM

## 2010-10-09 NOTE — Progress Notes (Signed)
Summary: Medication Coverage  Phone Note From Pharmacy   Caller: [facility] [phone] Reason for Call: Medication not on formulary Summary of Call: Pharmacist states patient will have to try a trial of Micardis before they will cover the Diovan. Patient will have to pay out of pocket if she is to stay on Diovan Initial call taken by: Jiles Garter CMA,  Jan 17, 2009 12:10 PM  Follow-up for Phone Call        attempted to contact patient at [phone] no answer detailed voice message left infroming patient of medication change Follow-up by: Jiles Garter CMA,  Jan 17, 2009 2:19 PM    New/Updated Medications: MICARDIS HCT 40-12.5 MG TABS (TELMISARTAN-HCTZ) one by mouth once daily   Prescriptions: MICARDIS HCT 40-12.5 MG TABS (TELMISARTAN-HCTZ) one by mouth once daily  #30 x 2   Entered and Authorized by:   D. Drema Pry DO   Signed by:   D. Drema Pry DO on 01/17/2009   Method used:   Electronically to        [facility] [address] # [phone]* (retail)       [address]       [place], [place]  [address]       Ph: [phone]       Fax: [phone]   RxID:   [id number]

## 2010-10-09 NOTE — Assessment & Plan Note (Signed)
Summary: 1 MONTH ROV-CH   Vital Signs:  Patient profile:   76 year old female Height:      63 inches Weight:      257 pounds BMI:     45.69 Temp:     98.1 degrees F oral Pulse rate:   78 / minute Pulse rhythm:   regular Resp:     20 per minute BP sitting:   140 / 80  (left arm) Cuff size:   large  Vitals Entered By: Marcelino Freestone RMA (December 13, 2008 2:02 PM) CC: follow-up visit   History of Present Illness: The patient admitted on September 27, 2008 secondary acute hypoxic respiratory failure.  CT angio of the chest revealed multiple bilateral pulmonary emboli with large clot burden.  Patient was anticoagulated  with heparin and coumadin.  She elected to take part in [place] study.  She is taking rivaroxaban.   She sees Dr. Joya Gaskins for management and is UTD on labs and follow up.  Patient denies abnormal bleeding.    During her hospitalization patient also had exacerbation of anxiety.  She was seen by Dr. Rhona Raider and he recommended increasing the patient's sertraline to 100 mg.  He also recommended outpatient psychiatry follow-up.  She has an initial appointment with psychiatry in 2weeks  BGM in the 100-150 range consistently.    patient was on crestor for hyperlipidemia, but she had chronic, mild myalgia / arthralgia and when she skipped some pills - the symptoms resolved.  she is off the med now.  patient is on allegra for allergies with good control  Patient with a history of  hypothyroidism - was changed from armour thyroid to synthroid in hospital and follow up TSH at last appointment was within normal limits.  Hypertension - patient was hypotensive during her hospitalization. Aldactone was discontinued and avapro was put on hold with the idea that it would be added back in if she required something for BP control. At her last appointment, she had slightly elevated BP and still with her persistent, chronic lower extremity edema.  We tried her on HCTZ - but there was no relief  from the edema.  Will try her back on her aldactone  Patient has an appointment with sleep study specialist on 12/17/08 to adjust c-pap and oxygen use  Current Medications (verified): 1)  Synthroid 100 Mcg Tabs (Levothyroxine Sodium) .... Take 1 Tablet By Mouth Once A Day 2)  Fexofenadine Hcl 180 Mg Tabs (Fexofenadine Hcl) .... Take 1 Tablet By Mouth Once A Day 3)  Sertraline Hcl 100 Mg Tabs (Sertraline Hcl) .... One By Mouth Once Daily 4)  Actos 45 Mg Tabs (Pioglitazone Hcl) .... Take 1/2 Tablet By Mouth Once A Day 5)  Crestor 10 Mg Tabs (Rosuvastatin Calcium) .... Take 1 Tablet By Mouth Once A Day 6)  Januvia 100 Mg Tabs (Sitagliptin Phosphate) .... One By Mouth Once Daily 7)  Rivaroxaban - For Pulmonary Embolism Tx .... Patient Is in A Phase Iii Study With Dr. Asencion Noble 20mg  By Mouth Daily 8)  Hydrochlorothiazide 12.5 Mg Caps (Hydrochlorothiazide) .... One Tab By Mouth Once Daily 9)  Zantac 150 Maximum Strength 150 Mg Tabs (Ranitidine Hcl) .... One Tab By Mouth Once Daily As Needed 10)  Multivitamins  Tabs (Multiple Vitamin) .... One By Mouth Once Daily  Allergies: 1)  ! Glucophage 2)  ! Sulfa 3)  ! Tetracycline 4)  ! Aspirin 5)  ! * Hormones 6)  ! Advair Diskus (Fluticasone-Salmeterol) 7)  !  Lisinopril 8)  ! Crestor (Rosuvastatin Calcium) 9)  ! * Latex  Past History:  Past Medical History:    Reviewed history from 10/26/2008 and no changes required:    Current Problems:     HYPOTHYROIDISM (ICD-244.9)    HYPERCHOLESTEROLEMIA (ICD-272.0)    HYPERTENSION (ICD-401.9)    SYNCOPE AND COLLAPSE (ICD-780.2)    DIABETES MELLITUS (ICD-250.00)    DEPRESSION (ICD-311)    ASTHMA, CHILDHOOD (ICD-493.00)  - no problem since adulthood    Allergic rhinitis    obesity    panic attacks / anxiety    Acute hypoxic respiratory failure secondary to multiple bilateral pulmonary embolism 09/2008    Negative hypercoagulable workup 09/2008 hospital stay    sleep apnea  Past Surgical History:     Reviewed history from 09/27/2008 and no changes required:    thyroid removed  Review of Systems       no nausea / vomiting / diarrhea / fever / chills / chest pain / SOB   Physical Exam  Additional Exam:  General:     Well-developed, well nourished, well hydrated obese female in no acute distress   Head:     Normocephalic and atraumatic without obvious abnormalities.  Eyes:     No corneal or conjunctival inflammation. EOMI. PERRLA. Vision grossly normal. Ears:     External ear shows no significant lesions or deformities.   Hearing is grossly normal. Nose:     External nasal examination shows no deformity or inflammation.  Neck:     supple, full ROM  Lungs:     Normal respiratory effort, chest expands symmetrically with adequate air flow noted. Lungs clear to auscultation with no crackles, rales or wheezes.   Heart:     Normal rate and  rhythm. S1 and S2 normal, without gallop, murmur, click, rub  Extremities:     No cyanosis, or deformity noted, grossly normal full range of motion with all joints.  1+ edema bilaterally Neurologic:     alert & oriented X3,  gait normal.  no deficit in strength noted, no balance problems noted, neuro is grossly intact Skin:     Intact without suspicious lesions or rashes Psych:     Cognition and judgment appear intact. Alert and cooperative with normal attention span and concentration. No apparent delusions, illusions, hallucinations, normally interactive, good eye contact, not anxious or depressed appearing, and not agitated.      Impression & Recommendations:  Problem # 1:  HYPERTENSION (ICD-401.9) D/c HCTZ and start spironolactone.  patient had been on in the past with good control of BP and of her chronic edema.  follow up in one month.  will need full labs at that time. Her updated medication list for this problem includes:    Spironolactone 25 Mg Tabs (Spironolactone) ..... One tab by mouth once daily  Problem # 2:   HYPERCHOLESTEROLEMIA (ICD-272.0) patient has had persistent myalgia / arthralgia.  patient is off the meds.  at follow up appointment next month with Dr. Shawna Orleans - they will determine a new med to try. The following medications were removed from the medication list:    Crestor 10 Mg Tabs (Rosuvastatin calcium) .Marland Kitchen... Take 1 tablet by mouth once a day  Problem # 3:  DIABETES MELLITUS (ICD-250.00) continue current meds.  patient will be due for labs at follow up next month Her updated medication list for this problem includes:    Actos 45 Mg Tabs (Pioglitazone hcl) .Marland Kitchen... Take 1/2 tablet by mouth once  a day    Januvia 100 Mg Tabs (Sitagliptin phosphate) ..... One by mouth once daily  Problem # 4:  PULMONARY EMBOLISM (ICD-415.19) continue with treatment plan per Dr. Joya Gaskins.  Problem # 5:  LEG EDEMA, CHRONIC (ICD-782.3) will restart her spironolactone and follow up in one month.  will need labs at that time. Her updated medication list for this problem includes:    Spironolactone 25 Mg Tabs (Spironolactone) ..... One tab by mouth once daily  Problem # 6:  ANXIETY (ICD-300.00) She has an initial appointment with psychiatry in 2 weeks.  doing well for now.  continue meds Her updated medication list for this problem includes:    Sertraline Hcl 100 Mg Tabs (Sertraline hcl) ..... One by mouth once daily  Problem # 7:  HYPOTHYROIDISM (ICD-244.9) continue meds.   Her updated medication list for this problem includes:    Synthroid 100 Mcg Tabs (Levothyroxine sodium) .Marland Kitchen... Take 1 tablet by mouth once a day  Problem # 8:  ALLERGIC RHINITIS (ICD-477.9) continue meds Her updated medication list for this problem includes:    Fexofenadine Hcl 180 Mg Tabs (Fexofenadine hcl) .Marland Kitchen... Take 1 tablet by mouth once a day  Complete Medication List: 1)  Synthroid 100 Mcg Tabs (Levothyroxine sodium) .... Take 1 tablet by mouth once a day 2)  Fexofenadine Hcl 180 Mg Tabs (Fexofenadine hcl) .... Take 1 tablet by mouth  once a day 3)  Sertraline Hcl 100 Mg Tabs (Sertraline hcl) .... One by mouth once daily 4)  Actos 45 Mg Tabs (Pioglitazone hcl) .... Take 1/2 tablet by mouth once a day 5)  Januvia 100 Mg Tabs (Sitagliptin phosphate) .... One by mouth once daily 6)  Rivaroxaban - For Pulmonary Embolism Tx  .... Patient is in a phase iii study with dr. Asencion Noble 20mg  by mouth daily 7)  Spironolactone 25 Mg Tabs (Spironolactone) .... One tab by mouth once daily 8)  Zantac 150 Maximum Strength 150 Mg Tabs (Ranitidine hcl) .... One tab by mouth once daily as needed 9)  Multivitamins Tabs (Multiple vitamin) .... One by mouth once daily  Patient Instructions: 1)  Please schedule a follow-up appointment in 1 month with MD in the am / fasting.  NOT just a lab appointment - with Dr. Shawna Orleans Prescriptions: SPIRONOLACTONE 25 MG TABS (SPIRONOLACTONE) one tab by mouth once daily  #30 x 1   Entered and Authorized by:   Orinda Kenner MD   Signed by:   Orinda Kenner MD on 12/13/2008   Method used:   Electronically to        [facility] [address] # [phone]* (retail)       [address]       [place], [place]  [address]       Ph: [phone]       Fax: [phone]   [place]:   [phone]

## 2010-10-09 NOTE — Assessment & Plan Note (Signed)
Summary: Pulmonary OV   Copy to:  Dr. Asencion Noble Primary Provider/Referring Provider:  D. Drema Pry DO  CC:  2 mo follow up.  Pt states breathing is doing well overall.Marland Kitchen  History of Present Illness: Pulmonary OV  Pt was admitted 1/21- 1/29 for Acute PE.  Pt enrolled into [place] trial and randomized to rivaroxaban. Pt is doing much better.  No pain.  No cough.  No wheeze.  Pt without any bleeding issues. Pt is scheduled for 72month rx program.  Had a rash on advair that went away when advair discontinued.  Notes some edema in feet.   February 05, 2009 4:24 PM last ov 3/10: The pt is doing well The pt had PE 1/10.   The pt was enrolled in [place] trial and enrolled to Rivaroxaban.  The pt is due to come off med soon  end of july 10.   There is no cough or dyspnea. There is no chest pain. Pt denies any significant sore throat, nasal congestion or excess secretions, fever, chills, sweats, unintended weight loss, pleurtic or exertional chest pain, orthopnea PND, or leg swelling Pt denies any increase in rescue therapy over baseline, denies waking up needing it or having any early am or nocturnal exacerbations of coughing/wheezing/or dyspnea.  April 09, 2009 3:03 PM Pt is now off rivaroxaban.  Received 6 months of therapy.  Was then bridged with arixta and coumadin.  Saw hematology who recommended 37months total anticoagulation.  (rx 6 months more.) Pt has no chest pain or dyspnea.  Had a CT chest recently obtained and was negative for embolii. Dr Marin Olp manages the coumadin.   Pt denies any significant sore throat, nasal congestion or excess secretions, fever, chills, sweats, unintended weight loss, pleurtic or exertional chest pain, orthopnea PND, or leg swelling June 04, 2009 10:57 AM Pt now on comadin for one year from 6/10.  Pt had CT chest two months ago without PE seen.  INR 3.2  but bruises easily. Pt denies any significant sore throat, nasal congestion or excess  secretions, fever, chills, sweats, unintended weight loss, pleurtic or exertional chest pain, orthopnea PND, or leg swelling No dyspnea or cough.  No chest pain.    Current Medications (verified): 1)  Synthroid 100 Mcg Tabs (Levothyroxine Sodium) .... Take 1 Tablet By Mouth Once A Day 2)  Fexofenadine Hcl 180 Mg Tabs (Fexofenadine Hcl) .... Take 1 Tablet By Mouth Once A Day 3)  Sertraline Hcl 100 Mg Tabs (Sertraline Hcl) .... One By Mouth Once Daily 4)  Actos 30 Mg Tabs (Pioglitazone Hcl) .... Take 1 Tablet By Mouth Once A Day 5)  Micardis Hct 40-12.5 Mg Tabs (Telmisartan-Hctz) .... One By Mouth Once Daily 6)  Zantac 150 Maximum Strength 150 Mg Tabs (Ranitidine Hcl) .... One Tab By Mouth Once Daily As Needed 7)  Multivitamins  Tabs (Multiple Vitamin) .... One By Mouth Once Daily 8)  Vitamin D 2000 Unit Caps (Cholecalciferol) .... Take 1 Capsule By Mouth Once A Day 9)  Fish Oil 1000 Mg Caps (Omega-3 Fatty Acids) .... Take 2  Tablet By Mouth Two Times A Day 10)  Lipitor 20 Mg Tabs (Atorvastatin Calcium) .... Take 1 Tablet By Mouth Once A Day 11)  Auto Cpap .... 5-15 12)  Coumadin 5 Mg Tabs (Warfarin Sodium) .... As Directed 13)  Januvia 100 Mg Tabs (Sitagliptin Phosphate) .... Take 1 Tablet By Mouth Once A Day 14)  Co Q-10 200 Mg Caps (Coenzyme Q10) .... Take 1  Tablet By Mouth Once A Day  Allergies (verified): 1)  ! Sulfa 2)  ! Tetracycline 3)  ! Aspirin 4)  ! * Hormones 5)  ! Advair Diskus (Fluticasone-Salmeterol) 6)  ! Lisinopril 7)  ! * Latex 8)  Glucophage 9)  * Ivp Dye  Past History:  Past medical, surgical, family and social histories (including risk factors) reviewed, and no changes noted (except as noted below).  Past Medical History: Reviewed history from 05/21/2009 and no changes required. Current Problems:  HYPOTHYROIDISM (ICD-244.9) HYPERCHOLESTEROLEMIA (ICD-272.0)  HYPERTENSION (ICD-401.9) SYNCOPE AND COLLAPSE (ICD-780.2) DIABETES MELLITUS (ICD-250.00)  DEPRESSION (ICD-311)  ASTHMA, CHILDHOOD (ICD-493.00)  - no problem since adulthood Allergic rhinitis obesity  panic attacks / anxiety Acute hypoxic respiratory failure secondary to multiple bilateral pulmonary embolism 09/2008 Negative hypercoagulable workup 09/2008 hospital stay Severe obstructive sleep apnea   Past Surgical History: Reviewed history from 05/21/2009 and no changes required. thyroid removed    Family History: Reviewed history from 05/21/2009 and no changes required. Family History of Arthritis Family History Diabetes 1st degree relative Family History High cholesterol Family History Hypertension Family History of  CAD Family History of  stroke Family History of  emotional/mental illness daughters-arthritis, allergies mother-asthma and allergies, clotting disorder, arthritis mat grandmother- clotting dx, rheumatism mat uncle-clotting dx  daughter-clotting dx, allergies    Social History: Reviewed history from 05/21/2009 and no changes required. 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 Former Smoker Quit smoking 1991.  Smoked off and on x 20 yrs. Alcohol use-no Drug use-no      Review of Systems  The patient denies shortness of breath with activity, shortness of breath at rest, productive cough, non-productive cough, coughing up blood, chest pain, irregular heartbeats, acid heartburn, indigestion, loss of appetite, weight change, abdominal pain, difficulty swallowing, sore throat, tooth/dental problems, headaches, nasal congestion/difficulty breathing through nose, sneezing, itching, ear ache, anxiety, depression, hand/feet swelling, joint stiffness or pain, rash, change in color of mucus, and fever.    Vital Signs:  Patient profile:   76 year old female Height:      62 inches Weight:      261.13 pounds BMI:     47.93 O2 Sat:      93 % on Room air Temp:     98.1 degrees F oral Pulse rate:   83 / minute BP sitting:    130 / 60  (left arm) Cuff size:   large  Vitals Entered By: Raymondo Band RN (June 04, 2009 10:53 AM)  O2 Flow:  Room air CC: 2 mo follow up.  Pt states breathing is doing well overall. Comments Medications reviewed with patient Raymondo Band RN  June 04, 2009 10:54 AM    Physical Exam  Additional Exam:  Gen. Pleasant, well-nourished, in no distress, normal affect ENT - no lesions, no post nasal drip Neck: No JVD, no thyromegaly, no carotid bruits Lungs: no use of accessory muscles, no dullness to percussion, clear without rales or rhonchi  Cardiovascular: Rhythm regular, heart sounds  normal, no murmurs or gallops, 2+ peripheral edema Musculoskeletal: No deformities, no cyanosis or clubbing      Impression & Recommendations:  Problem # 1:  PULMONARY EMBOLISM (ICD-415.19) Assessment Improved Pulmonary emboii  resolved plan coumadin rx per hematology rov pulmonary as needed  Her updated medication list for this problem includes:    Coumadin 5 Mg Tabs (Warfarin sodium) .Marland Kitchen... As directed  Orders: Est. Patient Level III [id number])  Complete Medication List: 1)  Synthroid 100 Mcg Tabs (Levothyroxine sodium) .... Take 1 tablet by mouth once a day 2)  Fexofenadine Hcl 180 Mg Tabs (Fexofenadine hcl) .... Take 1 tablet by mouth once a day 3)  Sertraline Hcl 100 Mg Tabs (Sertraline hcl) .... One by mouth once daily 4)  Actos 30 Mg Tabs (Pioglitazone hcl) .... Take 1 tablet by mouth once a day 5)  Micardis Hct 40-12.5 Mg Tabs (Telmisartan-hctz) .... One by mouth once daily 6)  Zantac 150 Maximum Strength 150 Mg Tabs (Ranitidine hcl) .... One tab by mouth once daily as needed 7)  Multivitamins Tabs (Multiple vitamin) .... One by mouth once daily 8)  Vitamin D 2000 Unit Caps (Cholecalciferol) .... Take 1 capsule by mouth once a day 9)  Fish Oil 1000 Mg Caps (Omega-3 fatty acids) .... Take 2  tablet by mouth two times a day 10)  Lipitor 20 Mg Tabs (Atorvastatin calcium) ....  Take 1 tablet by mouth once a day 11)  Auto Cpap  .... 5-15 12)  Coumadin 5 Mg Tabs (Warfarin sodium) .... As directed 13)  Januvia 100 Mg Tabs (Sitagliptin phosphate) .... Take 1 tablet by mouth once a day 14)  Co Q-10 200 Mg Caps (Coenzyme q10) .... Take 1 tablet by mouth once a day  Patient Instructions: 1)  No change in medications 2)  Return  as needed 3)  Follow up with Dr Marin Olp

## 2010-10-09 NOTE — Progress Notes (Signed)
Summary: Coumadin management  Phone Note Outgoing Call   Summary of Call: call pt - did she get call from Dr. Marin Olp re:  coumadin instructions Initial call taken by: D. Drema Pry DO,  June 24, 2010 3:09 PM  Follow-up for Phone Call        Spoke to pt, she has not received call from Dr. Antonieta Pert office.  Gilmore Laroche Fergerson CMA Deborra Medina)  June 24, 2010 3:28 PM   Additional Follow-up for Phone Call Additional follow up Details #1::        Pt advised to continue 5 m of coumadin. she has f/u appt this thurs with Dr. Marin Olp Additional Follow-up by: D. Drema Pry DO,  June 24, 2010 5:45 PM

## 2010-10-09 NOTE — Miscellaneous (Signed)
Summary: Lab Order dor August appt  Clinical Lists Changes  Orders: Added new Test order of T-Basic Metabolic Panel ([id number]) - Signed Added new Test order of T- Hemoglobin A1C [id number]) - Signed Added new Test order of T-Urine Microalbumin w/creat. ratio [id number]) - Signed Added new Test order of T-TSH [id number]) - Signed

## 2010-10-09 NOTE — Assessment & Plan Note (Signed)
Summary: 2 WEEK FOLLOW UP/MHFQ   Vital Signs:  Patient profile:   76 year old female Height:      62 inches Weight:      251.13 pounds BMI:     46.10 O2 Sat:      92 % on Room air Temp:     98.3 degrees F oral Pulse rate:   63 / minute Pulse rhythm:   regular Resp:     20 per minute BP sitting:   130 / 54  (right arm) Cuff size:   large  Vitals Entered By: Jiles Garter CMA (July 09, 2010 11:12 AM)  O2 Flow:  Room air CC: 2 Week Follow up  Is Patient Diabetic? Yes Did you bring your meter with you today? No Pain Assessment Patient in pain? no        Primary Care Provider:  Jennings Books DO  CC:  2 Week Follow up .  History of Present Illness:  76 y/o white female for f/u resp status improving.  gout - improved  daughter concerned pt having more anxiety since decline in health  Htn - fatigue and dizziness  Allergies: 1)  ! Sulfa 2)  ! Tetracycline 3)  ! Aspirin 4)  ! * Hormones 5)  ! Advair Diskus (Fluticasone-Salmeterol) 6)  ! Lisinopril 7)  ! 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 Former Smoker Quit smoking 1991.  Smoked off and on x 20 yrs. Alcohol use-no   Drug use-no               Physical Exam  General:  alert, well-developed, and well-nourished.   Lungs:  normal respiratory effort, normal breath sounds, no  crackles, and no wheezes.   Heart:  normal rate, regular rhythm, and no gallop.   Extremities:  1+ left pedal edema and 1+ right pedal edema.     Impression & Recommendations:  Problem # 1:  PNEUMONIA (ICD-486) Assessment Improved continue follow up with pulm  Problem # 2:  GOUT (ICD-274.9) Assessment: Improved  Problem # 3:  ANXIETY (ICD-300.00) Assessment: Deteriorated continue SSRI  Her updated medication list for this problem includes:    Sertraline Hcl 50 Mg Tabs (Sertraline hcl) .Marland Kitchen... Take 1 tablet by mouth once a day  Problem # 4:  HYPERTENSION (ICD-401.9) change benicar to losartan.  The following medications were removed from the medication list:    Benicar 20 Mg Tabs (Olmesartan medoxomil) ..... One by mouth once daily Her updated medication list for this problem includes:    Furosemide 20 Mg Tabs (Furosemide) .Marland Kitchen... 2  tabs by mouth once daily    Losartan Potassium 50 Mg Tabs (Losartan potassium) ..... One by mouth once daily  BP today: 130/54 Prior BP: 118/80 (07/04/2010)  Labs Reviewed: K+: 4.0 (06/23/2010) Creat: : 1.22 (06/23/2010)   Chol: 148 (08/16/2009)   HDL: 44.80 (08/16/2009)   LDL: 65 (08/16/2009)   TG: 190.0 (08/16/2009)  Complete  Medication List: 1)  Synthroid 100 Mcg Tabs (Levothyroxine sodium) .... Take 1 tablet by mouth once a day 2)  Fexofenadine Hcl 180 Mg Tabs (Fexofenadine hcl) .... Take 1 tablet by mouth once a day 3)  Sertraline Hcl 50 Mg Tabs (Sertraline hcl) .... Take 1 tablet by mouth once a day 4)  Zantac 150 Maximum Strength 150 Mg Tabs (Ranitidine hcl) .... One tab by mouth once daily as needed 5)  Multivitamins Tabs (Multiple vitamin) .... One by mouth once daily 6)  Vitamin D 2000 Unit Caps (Cholecalciferol) .... Take 1 capsule by mouth once a day 7)  Fish Oil 1000 Mg Caps (Omega-3 fatty acids) .... Take 2  tablet by mouth two times a day 8)  Crestor 20 Mg Tabs (Rosuvastatin calcium) .... Take 1 tablet by mouth once a day 9)  Cpap   .Marland Kitchen.. [employer] 10)  Coumadin 5 Mg Tabs (Warfarin sodium) .... As directed 11)  Co Q-10 200 Mg Caps (Coenzyme q10) .... Take 1 tablet by mouth once a day 12)  Nateglinide 60 Mg Tabs (Nateglinide) .... One tab at noon before meals as needed 13)  Furosemide 20 Mg Tabs (Furosemide) .... 2  tabs by mouth once daily 14)  Pen Needles 1/2" 29g X 56mm Misc (Insulin pen needle) .... Use once daily as directed 15)  Novolog Mix 70/30 Flexpen 70-30 % Susp (Insulin aspart prot & aspart) .... 30 units two times a day 16)  Losartan Potassium 50 Mg Tabs (Losartan potassium) .... One by mouth once daily  Patient Instructions: 1)  Please schedule a follow-up appointment in 3 months. 2)  BMP prior to visit, ICD-9: 401.9 3)  HbgA1C prior to visit, ICD-9:   250.02 4)  Uric acid level - 274.9 5)  Please return for lab work one (1) week before your next appointment.  Prescriptions: FUROSEMIDE 20 MG TABS (FUROSEMIDE) 2  tabs by mouth once daily  #60 x 3   Entered and Authorized by:   D. Drema Pry DO   Signed by:   D. Drema Pry DO on 07/09/2010   Method used:   Electronically to        [facility] # [phone]* (retail)       [address]       [place], [place]  [address]       Ph: [phone]       Fax: [phone]   RxID:   [id number] LOSARTAN POTASSIUM 50 MG TABS (LOSARTAN POTASSIUM) one by mouth once daily  #30 x 3   Entered and Authorized by:   D. Drema Pry DO   Signed by:   D. Drema Pry DO on 07/09/2010   Method used:   Electronically to        [facility] [address] # [phone]* (retail)       [address]       [place], [place]  [address]       Ph: [phone]       Fax: [phone]   RxID:   [id number]    Orders Added: 1)  Est. Patient Level III [id number]     Current Allergies (reviewed today): ! SULFA ! TETRACYCLINE ! ASPIRIN ! * HORMONES ! ADVAIR DISKUS (FLUTICASONE-SALMETEROL) ! LISINOPRIL ! * LATEX GLUCOPHAGE * IVP DYE

## 2010-10-09 NOTE — Letter (Signed)
Summary: [facility]   Imported By: Edmonia James 12/10/2009 12:53:12  _____________________________________________________________________  External Attachment:    Type:   Image     Comment:   External Document

## 2010-10-09 NOTE — Assessment & Plan Note (Signed)
Summary: rash/mhf   Vital Signs:  Patient profile:   76 year old female Height:      62 inches Weight:      267 pounds BMI:     49.01 O2 Sat:      97 % on Room air Temp:     97.8 degrees F oral Pulse rate:   76 / minute Pulse rhythm:   regular Resp:     22 per minute BP sitting:   132 / 60  (right arm) Cuff size:   large  Vitals Entered By: Jiles Garter CMA (Jan 16, 2010 1:33 PM)  O2 Flow:  Room air CC: Rm 2- Rash Is Patient Diabetic? Yes Comments Rash in upper chest for the past 2 days, topical oinmtent used for severe irritation, some improvement   Primary Care Provider:  Jennings Books DO  CC:  Rm 2- Rash.  History of Present Illness:  Rash      This is a 76 year old woman who presents with Rash.  The patient reports macules, but denies hives, welts, pustules, and blisters.  The rash is located on the chest.  The rash is worse with scratching.  The patient denies the following symptoms: fever, headache, facial swelling, and tongue swelling.  The patient reports a history of recent antibiotic use.  The patient denies history of new clothing and new topical exposure.    Allergies: 1)  ! Sulfa 2)  ! Tetracycline 3)  ! Aspirin 4)  ! * Hormones 5)  ! Advair Diskus (Fluticasone-Salmeterol) 6)  ! Lisinopril 7)  ! * Latex 8)  Glucophage 9)  * Ivp Dye  Past History:  Past Medical History: Current Problems:  HYPOTHYROIDISM (ICD-244.9) HYPERCHOLESTEROLEMIA (ICD-272.0)   HYPERTENSION (ICD-401.9)   SYNCOPE AND COLLAPSE (ICD-780.2)    DIABETES MELLITUS (ICD-250.00) DEPRESSION (ICD-311)  ASTHMA, CHILDHOOD (ICD-493.00)  - no problem since adulthood Allergic rhinitis obesity  panic attacks / anxiety Acute hypoxic respiratory failure secondary to multiple bilateral pulmonary embolism 09/2008 Negative hypercoagulable workup 09/2008 hospital stay Severe obstructive sleep apnea    Past Surgical History: thyroid removed          Family History: Family History of  Arthritis Family History Diabetes 1st degree relative Family History High cholesterol Family History Hypertension Family History of  CAD Family History of  stroke Family History of  emotional/mental illness daughters-arthritis, allergies mother-asthma and allergies, clotting disorder, arthritis mat grandmother- clotting dx, rheumatism mat uncle-clotting dx  daughter-clotting dx, allergies           Social History: 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 Former Smoker Quit smoking 1991.  Smoked off and on x 20 yrs. Alcohol use-no   Drug use-no            Physical Exam  General:  alert and overweight-appearing.   Lungs:  normal respiratory effort and normal breath sounds.   Heart:  normal rate, regular rhythm, and no gallop.   Extremities:  trace left pedal edema and trace right pedal edema.   Skin:  macular rash upper chest,  no rash on back, or arms  minimal redness of lower ext   Impression & Recommendations:  Problem # 1:  DERMATITIS (ICD-692.9)  Pt with rash on chest.  I  doubt rxn from cephalexin.  She is very sensitive to meds that contain sulfa.  DC glimepiride use topical steroid as directed.  Patient advised to call office if symptoms persist or worsen.  Her updated medication list for this problem includes:    Fexofenadine Hcl 180 Mg Tabs (Fexofenadine hcl) .Marland Kitchen... Take 1 tablet by mouth once a day    Betamethasone Valerate 0.1 % Crea (Betamethasone valerate) .Marland Kitchen... Apply two times a day x  1 week  Orders: Prescription Created Electronically [id number])  Complete Medication List: 1)  Synthroid 100 Mcg Tabs (Levothyroxine sodium) .... Take 1 tablet by mouth once a day 2)  Fexofenadine Hcl 180 Mg Tabs (Fexofenadine hcl) .... Take 1 tablet by mouth once a day 3)  Sertraline Hcl 50 Mg Tabs (Sertraline hcl) .... Take 1 tablet by mouth once a day 4)  Actos 15 Mg Tabs (Pioglitazone hcl) .... One by mouth once daily 5)  Micardis Hct  40-12.5 Mg Tabs (Telmisartan-hctz) .... One by mouth once daily 6)  Zantac 150 Maximum Strength 150 Mg Tabs (Ranitidine hcl) .... One tab by mouth once daily as needed 7)  Multivitamins Tabs (Multiple vitamin) .... One by mouth once daily 8)  Vitamin D 2000 Unit Caps (Cholecalciferol) .... Take 1 capsule by mouth once a day 9)  Fish Oil 1000 Mg Caps (Omega-3 fatty acids) .... Take 2  tablet by mouth two times a day 10)  Crestor 20 Mg Tabs (Rosuvastatin calcium) .... Take 1 tablet by mouth once a day 11)  Auto Cpap  .... 5-15 12)  Coumadin 5 Mg Tabs (Warfarin sodium) .... As directed 13)  Januvia 100 Mg Tabs (Sitagliptin phosphate) .... Take 1 tablet by mouth once a day 14)  Co Q-10 200 Mg Caps (Coenzyme q10) .... Take 1 tablet by mouth once a day 15)  Betamethasone Valerate 0.1 % Crea (Betamethasone valerate) .... Apply two times a day x  1 week  Patient Instructions: 1)  Stop glimepiride 2)  Keep your next appointment 3)  Please call our office if your rash gets worse or does not improve Prescriptions: BETAMETHASONE VALERATE 0.1 % CREA (BETAMETHASONE VALERATE) apply two times a day x  1 week  #30 grams x 0   Entered and Authorized by:   D. Drema Pry DO   Signed by:   D. Drema Pry DO on 01/16/2010   Method used:   Electronically to        [facility] # [phone]* (retail)       [address]       [place], [place]  [address]       Ph: [phone]       Fax: [phone]   RxID:   [id number]   Current Allergies (reviewed today): ! SULFA ! TETRACYCLINE ! ASPIRIN ! * HORMONES ! ADVAIR DISKUS (FLUTICASONE-SALMETEROL) ! LISINOPRIL ! * LATEX GLUCOPHAGE * IVP DYE

## 2010-10-09 NOTE — Assessment & Plan Note (Signed)
Summary: hospital follow up/mhf   Vital Signs:  Patient profile:   76 year old female Height:      62 inches Weight:      250.50 pounds BMI:     45.98 O2 Sat:      94 % on 2.5 L/min Temp:     98.1 degrees F oral Pulse rate:   68 / minute Pulse rhythm:   regular Resp:     20 per minute BP sitting:   134 / 60  (right arm) Cuff size:   large  Vitals Entered By: Jiles Garter CMA (June 23, 2010 2:39 PM)  O2 Flow:  2.5 L/min CC: Hosptial follow up  Is Patient Diabetic? Yes Did you bring your meter with you today? No Pain Assessment Patient in pain? no        Primary Care Provider:  Jennings Books DO  CC:  Hosptial follow up .  History of Present Illness: 76 y/o white female for hospital f/u Pt admitted for hypoxemic respiratory failure secondary to community-acquired  pneumonia.  she has history of pulmonary embolism and deep venous thrombosis CT of chest performed during hospitalization - negative for PE  pt having gout flare in right foot causing difficulty ambulating  Allergies: 1)  ! Sulfa 2)  ! Tetracycline 3)  ! Aspirin 4)  ! * Hormones 5)  ! Advair Diskus (Fluticasone-Salmeterol) 6)  ! Lisinopril 7)  ! * Latex 8)  Glucophage 9)  * Ivp Dye  Past History:  Past Medical History: Current Problems:  HYPOTHYROIDISM (ICD-244.9)  HYPERCHOLESTEROLEMIA (ICD-272.0)   HYPERTENSION (ICD-401.9)   SYNCOPE AND COLLAPSE (ICD-780.2)     DIABETES MELLITUS (ICD-250.00) DEPRESSION (ICD-311)   ASTHMA, CHILDHOOD (ICD-493.00)  - no problem since adulthood Allergic rhinitis obesity  panic attacks / anxiety Acute hypoxic respiratory failure secondary to multiple bilateral pulmonary embolism 09/2008 Negative hypercoagulable workup 09/2008 hospital stay Severe obstructive sleep apnea    Past Surgical History: thyroid surgery    Family History: Family History of Arthritis Family History Diabetes 1st degree relative Family History High cholesterol Family History  Hypertension Family History of  CAD Family History of  stroke Family History of  emotional/mental illness daughters-arthritis, allergies mother-asthma and allergies, clotting disorder, arthritis mat grandmother- clotting dx, rheumatism mat uncle-clotting dx  daughter-clotting dx, allergies             Social History: 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 Former Smoker Quit smoking 1991.  Smoked off and on x 20 yrs. Alcohol use-no   Drug use-no              Review of Systems  The patient denies fever, chest pain, and prolonged cough.    Physical Exam  General:  alert and overweight-appearing.   Eyes:  pupils equal, pupils round, and pupils reactive to light.   Ears:  R ear normal and L ear normal.   Mouth:  pharynx pink and moist.   Lungs:  normal respiratory effort, normal breath sounds, no crackles, and no wheezes.   Heart:  normal rate, regular rhythm, and no gallop.   Abdomen:  soft, non-tender, and normal bowel sounds.   Extremities:  1+ left pedal edema and 1+ right pedal edema.   Neurologic:  cranial nerves II-XII intact and gait normal.   Psych:  normally interactive and slightly anxious.     Impression & Recommendations:  Problem # 1:  PNEUMONIA (ICD-486) Assessment Improved arrange f/u CXR.  clinically improved.  I encouraged PT.  Orders: T-2 View CXR, Same Day ([id number].[place]) Misc. Referral (Misc. Ref) Misc. Referral (Misc. Ref)  Problem # 2:  GOUT (ICD-274.9) Assessment: Improved tx minor flare with prednisone  Orders: T-Uric Acid (Blood) [id number])  Problem # 3:  HYPOXEMIA (ICD-799.02) I suspect hypoxia from atx. rule out CHF  Orders: T-BNP  (B Natriuretic Peptide) [id number]) Misc. Referral (Misc. Ref)  Problem # 4:  HYPERTENSION (ICD-401.9) Assessment: Unchanged  Her updated medication list for this problem includes:    Benicar 20 Mg Tabs (Olmesartan medoxomil) ..... One by mouth once daily     Furosemide 20 Mg Tabs (Furosemide) .Marland KitchenMarland KitchenMarland KitchenMarland Kitchen 3 tabs by mouth once daily  Orders: T-Basic Metabolic Panel ([id number])  BP today: 134/60 Prior BP: 140/60 (05/09/2010)  Labs Reviewed: K+: 4.2 (05/02/2010) Creat: : 1.2 (05/02/2010)   Chol: 148 (08/16/2009)   HDL: 44.80 (08/16/2009)   LDL: 65 (08/16/2009)   TG: 190.0 (08/16/2009)  Problem # 5:  PULMONARY EMBOLISM (ICD-415.19)  Her updated medication list for this problem includes:    Coumadin 5 Mg Tabs (Warfarin sodium) .Marland Kitchen... As directed  Orders: T-Protime, Auto [id number])  Problem # 6:  DIABETES MELLITUS (ICD-250.00) pt did not want to use  victoza.  oral agents DC ed during hospitalization.  cont 70/30  The following medications were removed from the medication list:    Actos 15 Mg Tabs (Pioglitazone hcl) .Marland Kitchen... Take 1 tablet by mouth once a day    Januvia 100 Mg Tabs (Sitagliptin phosphate) .Marland Kitchen... Take 1 tablet by mouth once a day Her updated medication list for this problem includes:    Benicar 20 Mg Tabs (Olmesartan medoxomil) ..... One by mouth once daily    Nateglinide 60 Mg Tabs (Nateglinide) ..... One tab at noon before meals as needed    Novolog Mix 70/30 Flexpen 70-30 % Susp (Insulin aspart prot & aspart) .Marland KitchenMarland KitchenMarland KitchenMarland Kitchen 30 units two times a day  Labs Reviewed: Creat: 1.2 (05/02/2010)     Last Eye Exam: normal (08/07/2009) Reviewed HgBA1c results: 10.0 (05/02/2010)  6.8 (08/16/2009)  Complete Medication List: 1)  Synthroid 100 Mcg Tabs (Levothyroxine sodium) .... Take 1 tablet by mouth once a day 2)  Fexofenadine Hcl 180 Mg Tabs (Fexofenadine hcl) .... Take 1 tablet by mouth once a day 3)  Sertraline Hcl 50 Mg Tabs (Sertraline hcl) .... Take 1 tablet by mouth once a day 4)  Benicar 20 Mg Tabs (Olmesartan medoxomil) .... One by mouth once daily 5)  Zantac 150 Maximum Strength 150 Mg Tabs (Ranitidine hcl) .... One tab by mouth once daily as needed 6)  Multivitamins Tabs (Multiple vitamin) .... One by mouth once daily 7)  Vitamin  D 2000 Unit Caps (Cholecalciferol) .... Take 1 capsule by mouth once a day 8)  Fish Oil 1000 Mg Caps (Omega-3 fatty acids) .... Take 2  tablet by mouth two times a day 9)  Crestor 20 Mg Tabs (Rosuvastatin calcium) .... Take 1 tablet by mouth once a day 10)  Cpap  .Marland Kitchen.. 10 [employer] 11)  Coumadin 5 Mg Tabs (Warfarin sodium) .... As directed 12)  Co Q-10 200 Mg Caps (Coenzyme q10) .... Take 1 tablet by mouth once a day 13)  Nateglinide 60 Mg Tabs (Nateglinide) .... One tab at noon before meals as needed 14)  Furosemide 20 Mg Tabs (Furosemide) .... 3 tabs by mouth once daily 15)  Pen Needles 1/2" 29g X 38mm Misc (Insulin pen needle) .... Use once daily as directed 16)  Prednisone  10 Mg Tabs (Prednisone) .... One by mouth once daily 17)  Novolog Mix 70/30 Flexpen 70-30 % Susp (Insulin aspart prot & aspart) .... 30 units two times a day  Other Orders: T-TSH [id number])  Patient Instructions: 1)  Please schedule a follow-up appointment in 2 weeks. Prescriptions: PREDNISONE 10 MG TABS (PREDNISONE) one by mouth once daily  #7 x 0   Entered and Authorized by:   D. Drema Pry DO   Signed by:   D. Drema Pry DO on 06/23/2010   Method used:   Electronically to        [facility] [address] # [phone]* (retail)       [address]       [place], [place]  [address]       Ph: [phone]       Fax: [phone]   RxID:   [id number]    Orders Added: 1)  T-Basic Metabolic Panel 0000000 2)  T-Uric Acid (Blood) [84550-23180] 3)  T-Protime, Auto [id number] 4)  T-TSH [id number] 5)  T-BNP  (B Natriuretic Peptide) [83880-55185] 6)  T-2 View CXR, Same Day [71020.5TC] 7)  Misc. Referral [Misc. Ref] 8)  Misc. Referral [Misc. Ref] 9)  Est. Patient Level IV [id number]

## 2010-10-09 NOTE — Assessment & Plan Note (Signed)
Summary: 3 month follow up /mhf   Vital Signs:  Patient profile:   76 year old female Height:      62 inches Weight:      255.25 pounds BMI:     46.85 O2 Sat:      95 % on Room air Temp:     97.8 degrees F oral Pulse rate:   69 / minute Resp:     20 per minute BP sitting:   130 / 70  (right arm) Cuff size:   large  Vitals Entered By: Jiles Garter CMA (September 17, 2010 11:21 AM)  O2 Flow:  Room air CC: 3 month follow up  Is Patient Diabetic? Yes Pain Assessment Patient in pain? no      Comments c/o sinus problem,low blood sugar  125 high 301, average  125-239, patients states today is the last day for her coumadin, increase in anxiety due to fear of infection and getting sick   Primary Care Provider:  D. Drema Pry DO  CC:  3 month follow up .  History of Present Illness: sinus congestion x 2 days no sore throat no discolored mucus no fever  pt having anxiety about getting sick/pna    Preventive Screening-Counseling & Management  Alcohol-Tobacco     Smoking Status: quit  Allergies: 1)  ! Sulfa 2)  ! Tetracycline 3)  ! Aspirin 4)  ! * Hormones 5)  ! Advair Diskus (Fluticasone-Salmeterol) 6)  ! Lisinopril 7)  ! * Latex 8)  Glucophage 9)  * Ivp Dye  Past History:  Past Medical History: Current Problems:  HYPOTHYROIDISM (ICD-244.9)  HYPERCHOLESTEROLEMIA (ICD-272.0)   HYPERTENSION (ICD-401.9)   SYNCOPE AND COLLAPSE (ICD-780.2)       DIABETES MELLITUS (ICD-250.00)  DEPRESSION (ICD-311)   ASTHMA, CHILDHOOD (ICD-493.00)  - no problem since adulthood Allergic rhinitis obesity  panic attacks / anxiety Acute hypoxic respiratory failure secondary to multiple bilateral pulmonary embolism 09/2008 Negative hypercoagulable workup 09/2008 hospital stay Severe obstructive sleep apnea    Past Surgical History: thyroid surgery     Family History: Family History of Arthritis Family History Diabetes 1st degree relative Family History High  cholesterol Family History Hypertension Family History of  CAD Family History of  stroke Family History of  emotional/mental illness daughters-arthritis, allergies mother-asthma and allergies, clotting disorder, arthritis mat grandmother- clotting dx, rheumatism mat uncle-clotting dx  daughter-clotting dx, allergies               Social History: 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 Former Smoker Quit smoking 1991.  Smoked off and on x 20 yrs. Alcohol use-no   Drug use-no                 Physical Exam  General:  alert and overweight-appearing.   Ears:  R ear normal and L ear normal.   Nose:  mucosal erythema and mucosal edema.   Mouth:  pharyngeal erythema.   Lungs:  normal respiratory effort, normal breath sounds, no crackles, and no wheezes.   Heart:  normal rate, regular rhythm, and no gallop.   Extremities:  trace left pedal edema and trace right pedal edema.     Impression & Recommendations:  Problem # 1:  URI (ICD-465.9)  Her updated medication list for this problem includes:    Fexofenadine Hcl 180 Mg Tabs (Fexofenadine hcl) .Marland Kitchen... Take 1 tablet by mouth once a day  Instructed on symptomatic treatment. Call if symptoms persist or worsen.  pt very anxious about getting pna again symptomatic tx for now we discussed red flag symptoms pt to start ceftin if symptoms progress as well as return OV  Problem # 2:  ANXIETY (ICD-300.00) Assessment: Deteriorated increase sertraline to 100 mg  Her updated medication list for this problem includes:    Sertraline Hcl 100 Mg Tabs (Sertraline hcl) ..... One by mouth once daily  Complete Medication List: 1)  Synthroid 100 Mcg Tabs (Levothyroxine sodium) .... Take 1 tablet by mouth once a day 2)  Fexofenadine Hcl 180 Mg Tabs (Fexofenadine hcl) .... Take 1 tablet by mouth once a day 3)  Sertraline Hcl 100 Mg Tabs (Sertraline hcl) .... One by mouth once daily 4)  Zantac 150 Maximum  Strength 150 Mg Tabs (Ranitidine hcl) .... One tab by mouth once daily as needed 5)  Multivitamins Tabs (Multiple vitamin) .... One by mouth once daily 6)  Vitamin D 2000 Unit Caps (Cholecalciferol) .... Take 1 capsule by mouth once a day 7)  Fish Oil 1000 Mg Caps (Omega-3 fatty acids) .... Take 2  tablet by mouth two times a day 8)  Crestor 20 Mg Tabs (Rosuvastatin calcium) .... Take 1 tablet by mouth once a day 9)  Cpap  .Marland Kitchen.. 10 ahc 10)  Coumadin 5 Mg Tabs (Warfarin sodium) .... As directed 11)  Co Q-10 200 Mg Caps (Coenzyme q10) .... Take 1 tablet by mouth once a day 12)  Nateglinide 60 Mg Tabs (Nateglinide) .... One tab at noon before meals as needed 13)  Furosemide 20 Mg Tabs (Furosemide) .... 2  tabs by mouth once daily 14)  Pen Needles 1/2" 29g X 38mm Misc (Insulin pen needle) .... Use once daily as directed 15)  Losartan Potassium 50 Mg Tabs (Losartan potassium) .... One by mouth once daily 16)  Humalog Mix 75/25 75-25 % Susp (Insulin lispro prot & lispro) .... Inject 60 units subcutaneously two times a day with meals as directed 17)  Ipratropium Bromide 0.03 % Soln (Ipratropium bromide) .... 2 sprays each nostril three times a day as needed 18)  Cefuroxime Axetil 500 Mg Tabs (Cefuroxime axetil) .... One by mouth bid  Patient Instructions: 1)  Call our office if your symptoms do not  improve or gets worse. Prescriptions: SERTRALINE HCL 100 MG TABS (SERTRALINE HCL) one by mouth once daily  #90 x 1   Entered and Authorized by:   D. Drema Pry DO   Signed by:   D. Drema Pry DO on 09/17/2010   Method used:   Electronically to        [facility] [address] # [phone]* (retail)       [address]       [place], [place]  [address]       Ph: [phone]       Fax: [phone]   RxID:   [id number] CEFUROXIME AXETIL 500 MG TABS (CEFUROXIME AXETIL) one by mouth bid  #14 x 0   Entered and Authorized by:   D. Drema Pry DO   Signed by:   D. Drema Pry DO on 09/17/2010   Method used:    Print then Give to Patient   RxID:   [id number] IPRATROPIUM BROMIDE 0.03 % SOLN (IPRATROPIUM BROMIDE) 2 sprays each nostril three times a day as needed  #1 bottle x 1   Entered and Authorized by:   D. Drema Pry DO   Signed by:   D. Drema Pry DO on 09/17/2010   Method used:   Electronically  to        [facility] [address] # [phone]* (retail)       [address]       [place], [place]  [address]       Ph: [phone]       Fax: [phone]   RxID:   [id number]    Orders Added: 1)  Est. Patient Level III [id number]    Current Allergies (reviewed today): ! SULFA ! TETRACYCLINE ! ASPIRIN ! * HORMONES ! ADVAIR DISKUS (FLUTICASONE-SALMETEROL) ! LISINOPRIL ! * LATEX GLUCOPHAGE * IVP DYE

## 2010-10-09 NOTE — Medication Information (Signed)
Summary: Order for Stationary Commode/[facility]  Order for Stationary Commode/[facility]   Imported By: Laural Benes 08/05/2010 10:28:15  _____________________________________________________________________  External Attachment:    Type:   Image     Comment:   External Document

## 2010-10-09 NOTE — Progress Notes (Signed)
Summary: CONTINUE TREATMENT  Phone Note From Other Clinic   Caller: DR Asencion Noble ASSISTANT Summary of Call: PT HAS 30 DAYS LEFT OF THE ANTICOAGULATION STUDY AND THEY WANTED TO KNOW IF YOU WANTED PT TO ROLL INTO COUMADIN. CALL BACK [phone] PAGER#[phone] Initial call taken by: D. Drema Pry DO,  March 04, 2009 1:11 PM  Follow-up for Phone Call        yes, I would like pt to start taking coumadin.   Please contact pt to arrange referral to coumadin clinic.   I also suggest consultation with Dr Marin Olp Follow-up by: D. Drema Pry DO,  March 04, 2009 1:11 PM  Additional Follow-up for Phone Call Additional follow up Details #1::        Referral to Dr Marin Olp Additional Follow-up by: Jiles Garter,  March 08, 2009 10:04 AM

## 2010-10-09 NOTE — Assessment & Plan Note (Signed)
Summary: 2 WEEK ROV-CH   Vital Signs:  Patient Profile:   76 Years Old Female Height:     63 inches Weight:      256 pounds BMI:     45.51 O2 treatment:    Room Air Temp:     98.2 degrees F oral Pulse rate:   62 / minute Pulse rhythm:   regular Resp:     20 per minute BP sitting:   140 / 68  (left arm) Cuff size:   large  Vitals Entered By: Marcelino Freestone RMA (October 26, 2008 1:08 PM)                 Visit Type:  follow up PCP:  Orinda Kenner MD  Chief Complaint:  follow up.  History of Present Illness: Patient is feeling much better.  Patient is here with her daughter today.   The patient admitted on September 27, 2008 secondary acute hypoxic respiratory failure.  CT angio of the chest revealed multiple bilateral pulmonary emboli with large clot burden.  Patient was anticoagulated  with heparin and coumadin.  She elected to take part in [place] study.  She is taking rivaroxaban.   Patient was discharged with oxygen due to hypoxia with ambulation, but is only using prn at this point.  Patient denies shortness of breath today.  Patient denies abnormal bleeding.  She has routine labs with Dr. Joya Gaskins.  During her hospitalization patient also had exacerbation of anxiety.  She was seen by Dr. Rhona Raider and he recommended increasing the patient's sertraline to 100 mg.  He also recommended outpatient psychiatry follow-up.  However, that has not happened yet because they lost the name / phone # of the psychiatrist that was recommended for them to call.  They are asking for a recommendation today.  Diabetes mellitus-blood sugars were stable. Dr. Shawna Orleans changed her meds at her last appt on 10/11/08 due to lower extremity edema.  He decreased her actos to 1/2 of 45mg  tab and added Januvia 100.   Her A1c noted to be 7.1 during hospital stay and home readings in the 100-150 range consistently since medication changed.    Dr. Shawna Orleans also ordered CT scan of the abdomen/pelvis at the 10/11/08  appointment to rule out malignancy as a cause for the multiple pulmonary emboli - that is still pending.  patient is on crestor for hyperlipidemia.  patient is on allegra for allergies with good control  Patient with a history of  hypothyroidism - was changed from armour thyroid to synthroid in hospital  Hypertension - patient was hypotensive during her hospitalization. Aldactone were discontinued and avapro was put on hold with the idea that it would be added back in if she required something for BP control - at this point she has not required it.      Updated Prior Medication List: SYNTHROID 100 MCG TABS (LEVOTHYROXINE SODIUM) Take 1 tablet by mouth once a day FEXOFENADINE HCL 180 MG TABS (FEXOFENADINE HCL) Take 1 tablet by mouth once a day SERTRALINE HCL 100 MG TABS (SERTRALINE HCL) one by mouth once daily ACTOS 45 MG TABS (PIOGLITAZONE HCL) Take 1/2 tablet by mouth once a day CRESTOR 10 MG TABS (ROSUVASTATIN CALCIUM) Take 1 tablet by mouth once a day JANUVIA 100 MG TABS (SITAGLIPTIN PHOSPHATE) one by mouth once daily * RIVAROXABAN - FOR PULMONARY EMBOLISM [place] patient is in a Phase III study with Dr. Asencion Noble AVAPRO 150 MG TABS (IRBESARTAN) one by mouth once daily (on hold)  Current Allergies: ! GLUCOPHAGE ! SULFA ! TETRACYCLINE ! ASPIRIN ! * HORMONES ! ADVAIR DISKUS (FLUTICASONE-SALMETEROL) ! * LATEX  Past Medical History:    Current Problems:     HYPOTHYROIDISM (ICD-244.9)    HYPERCHOLESTEROLEMIA (ICD-272.0)    HYPERTENSION (ICD-401.9)    SYNCOPE AND COLLAPSE (ICD-780.2)    DIABETES MELLITUS (ICD-250.00)    DEPRESSION (ICD-311)    ASTHMA, CHILDHOOD (ICD-493.00)  - no problem since adulthood    Allergic rhinitis    obesity    panic attacks / anxiety    Acute hypoxic respiratory failure secondary to multiple bilateral pulmonary embolism 09/2008    Negative hypercoagulable workup 09/2008 hospital stay    sleep apnea  Past Surgical History:    Reviewed history from  09/27/2008 and no changes required:       thyroid removed     Review of Systems       no nausea / vomiting / diarrhea / fever / chills / chest pain / SOB    Physical Exam  General:     Well-developed, well nourished, well hydrated obese female in no acute distress   Head:     Normocephalic and atraumatic without obvious abnormalities.  Eyes:     No corneal or conjunctival inflammation. EOMI. PERRLA. Vision grossly normal. Ears:     External ear shows no significant lesions or deformities.   Hearing is grossly normal. Nose:     External nasal examination shows no deformity or inflammation.  Neck:     supple, full ROM  Lungs:     Normal respiratory effort, chest expands symmetrically with adequate air flow noted. Lungs clear to auscultation with no crackles, rales or wheezes.   Heart:     Normal rate and  rhythm. S1 and S2 normal, without gallop, murmur, click, rub  Extremities:     No cyanosis, or deformity noted, grossly normal full range of motion with all joints.  1+ edema bilaterally - improved per patient Neurologic:     alert & oriented X3,  gait normal.  no deficit in strength noted, no balance problems noted, neuro is grossly intact Skin:     Intact without suspicious lesions or rashes Psych:     Cognition and judgment appear intact. Alert and cooperative with normal attention span and concentration. No apparent delusions, illusions, hallucinations, normally interactive, good eye contact, not anxious or depressed appearing, and not agitated.       Impression & Recommendations:  Problem # 1:  PULMONARY EMBOLISM (ICD-415.19) new patient with a recent hospital stay for multiple pulmonary embolism, also with history of sleep apnea and pulmonary dysfunction.  Was seen by pulmonary in the hospital and needs outpatient follow up.  patient said appointment was never made.  patient is on a trial medication being managed by Dr. Joya Gaskins.  will arrange follow up with him.   Currently doing very well. Orders: Pulmonary Referral (Pulmonary)   Problem # 2:  DIABETES MELLITUS (ICD-250.00) current management working well.  Her updated medication list for this problem includes:    Actos 45 Mg Tabs (Pioglitazone hcl) .Marland Kitchen... Take 1/2 tablet by mouth once a day    Januvia 100 Mg Tabs (Sitagliptin phosphate) ..... One by mouth once daily    Avapro 150 Mg Tabs (Irbesartan) ..... One by mouth once daily (on hold)   Problem # 3:  HYPERTENSION (ICD-401.9) Patient is not on meds at this time due to hypotensive episode while in the hospital.  however, the patient's BP today  is borderline.  she is follow up with me in 2 weeks.  check BP at home - call if outside parameters.  Her updated medication list for this problem includes:    Avapro 150 Mg Tabs (Irbesartan) ..... One by mouth once daily - ON HOLD FOR NOW, BUT MAY NEED TO ADD BACK   Problem # 4:  PANIC ATTACK (ICD-300.01) patient was seen by inpatient psychiatrist for depression and panic - she was supposed to follow up with a outpatient psychiatrist, but the patient could not find the phone number after discharge.  will give referral Her updated medication list for this problem includes:    Sertraline Hcl 100 Mg Tabs (Sertraline hcl) ..... One by mouth once daily  Orders: Psychiatric Referral (Psych)   Problem # 5:  HYPERCHOLESTEROLEMIA (ICD-272.0) continue current medications. Her updated medication list for this problem includes:    Crestor 10 Mg Tabs (Rosuvastatin calcium) .Marland Kitchen... Take 1 tablet by mouth once a day   Problem # 6:  HYPOTHYROIDISM (ICD-244.9) Patient was changed from armour thyroid to synthroid in hospital.  will be due for labs at follow up in 2 weeks  Her updated medication list for this problem includes:    Synthroid 100 Mcg Tabs (Levothyroxine sodium) .Marland Kitchen... Take 1 tablet by mouth once a day   Problem # 7:  ALLERGIC RHINITIS (ICD-477.9) Continue with current meds Her updated medication  list for this problem includes:    Fexofenadine Hcl 180 Mg Tabs (Fexofenadine hcl) .Marland Kitchen... Take 1 tablet by mouth once a day   Complete Medication List: 1)  Synthroid 100 Mcg Tabs (Levothyroxine sodium) .... Take 1 tablet by mouth once a day 2)  Fexofenadine Hcl 180 Mg Tabs (Fexofenadine hcl) .... Take 1 tablet by mouth once a day 3)  Sertraline Hcl 100 Mg Tabs (Sertraline hcl) .... One by mouth once daily 4)  Actos 45 Mg Tabs (Pioglitazone hcl) .... Take 1/2 tablet by mouth once a day 5)  Crestor 10 Mg Tabs (Rosuvastatin calcium) .... Take 1 tablet by mouth once a day 6)  Januvia 100 Mg Tabs (Sitagliptin phosphate) .... One by mouth once daily 7)  Rivaroxaban - For Pulmonary Embolism Tx  .... Patient is in a phase iii study with dr. Asencion Noble 8)  Avapro 150 Mg Tabs (Irbesartan) .... One by mouth once daily (on hold)   Patient Instructions: 1)  Please schedule a follow-up appointment in 2 weeks - please give me a 40 minute appointment with this patient

## 2010-10-09 NOTE — Letter (Signed)
Summary: [facility]   Imported By: Edmonia James 10/11/2009 12:22:19  _____________________________________________________________________  External Attachment:    Type:   Image     Comment:   External Document

## 2010-10-09 NOTE — Letter (Signed)
Summary: [place]   Imported By: Edmonia James 10/31/2008 12:26:40  _____________________________________________________________________  External Attachment:    Type:   Image     Comment:   External Document

## 2010-10-09 NOTE — Assessment & Plan Note (Signed)
Summary: muscle spasm in legs/mhf   Vital Signs:  Patient profile:   76 year old female Weight:      254.50 pounds BMI:     46.72 Temp:     98.1 degrees F oral Pulse rate:   80 / minute Pulse rhythm:   regular Resp:     18 per minute BP sitting:   122 / 60  (left arm) Cuff size:   large  Vitals Entered By: Jiles Garter CMA (June 13, 2009 11:03 AM)  Primary Care Provider:  Jennings Books DO  CC:  Muslce Cramps.  History of Present Illness:  76 y/o c/o right sided back pain. She reports hitting her right side against her steering wheel several weeks ago resulting in significant pain.  She was seen at urgent care.   Since that time, pt reports intermittent spasms along right upper back and right lower back.  She also co cramps down right leg.  She wondered if symptoms were related to cholesterol medication.  She has not take any statin x 3-4 days.  No change in symptoms.   No exertional leg pain.  Allergies: 1)  ! Sulfa 2)  ! Tetracycline 3)  ! Aspirin 4)  ! * Hormones 5)  ! Advair Diskus (Fluticasone-Salmeterol) 6)  ! Lisinopril 7)  ! * Latex 8)  Glucophage 9)  * Ivp Dye  Past History:  Past Medical History: Current Problems:  HYPOTHYROIDISM (ICD-244.9) HYPERCHOLESTEROLEMIA (ICD-272.0)  HYPERTENSION (ICD-401.9) SYNCOPE AND COLLAPSE (ICD-780.2)  DIABETES MELLITUS (ICD-250.00) DEPRESSION (ICD-311)  ASTHMA, CHILDHOOD (ICD-493.00)  - no problem since adulthood Allergic rhinitis obesity  panic attacks / anxiety Acute hypoxic respiratory failure secondary to multiple bilateral pulmonary embolism 09/2008 Negative hypercoagulable workup 09/2008 hospital stay Severe obstructive sleep apnea   Past Surgical History: thyroid removed    Family History: Family History of Arthritis Family History Diabetes 1st degree relative Family History High cholesterol Family History Hypertension Family History of  CAD Family History of  stroke Family History of  emotional/mental  illness daughters-arthritis, allergies mother-asthma and allergies, clotting disorder, arthritis mat grandmother- clotting dx, rheumatism mat uncle-clotting dx  daughter-clotting dx, allergies     Social History: 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 Former Smoker Quit smoking 1991.  Smoked off and on x 20 yrs. Alcohol use-no Drug use-no        Physical Exam  General:  alert and overweight-appearing.   Head:  normocephalic and atraumatic.   Chest Wall:  small resolving bruise - right flank/ribs.  no rib tenderness. Lungs:  normal respiratory effort and normal breath sounds.   Heart:  normal rate, regular rhythm, and no gallop.   Abdomen:  soft, non-tender, and normal bowel sounds.     Impression & Recommendations:  Problem # 1:  BACK PAIN (ICD-724.5) Pt with right sided back / flank pain after hitting herself against steering wheel getting out of her car.   I suspect muscle bruise/strain.   Use muscle relaxer as needed.  Patient advised to call office if symptoms persist or worsen.  Her updated medication list for this problem includes:    Cyclobenzaprine Hcl 5 Mg Tabs (Cyclobenzaprine hcl) ..... One by mouth at bedtime as needed for muscle spasms  Problem # 2:  MYALGIA (ICD-729.1) I doubt back pain related to statin.  Check CPK.  Her updated medication list for this problem includes:    Cyclobenzaprine Hcl 5 Mg Tabs (Cyclobenzaprine hcl) ..... One by mouth  at bedtime as needed for muscle spasms  Orders: T-CK Total [id number])  Complete Medication List: 1)  Synthroid 100 Mcg Tabs (Levothyroxine sodium) .... Take 1 tablet by mouth once a day 2)  Fexofenadine Hcl 180 Mg Tabs (Fexofenadine hcl) .... Take 1 tablet by mouth once a day 3)  Sertraline Hcl 100 Mg Tabs (Sertraline hcl) .... One by mouth once daily 4)  Actos 30 Mg Tabs (Pioglitazone hcl) .... Take 1 tablet by mouth once a day 5)  Micardis Hct 40-12.5 Mg Tabs  (Telmisartan-hctz) .... One by mouth once daily 6)  Zantac 150 Maximum Strength 150 Mg Tabs (Ranitidine hcl) .... One tab by mouth once daily as needed 7)  Multivitamins Tabs (Multiple vitamin) .... One by mouth once daily 8)  Vitamin D 2000 Unit Caps (Cholecalciferol) .... Take 1 capsule by mouth once a day 9)  Fish Oil 1000 Mg Caps (Omega-3 fatty acids) .... Take 2  tablet by mouth two times a day 10)  Lipitor 20 Mg Tabs (Atorvastatin calcium) .... Take 1 tablet by mouth once a day 11)  Auto Cpap  .... 5-15 12)  Coumadin 5 Mg Tabs (Warfarin sodium) .... As directed 13)  Januvia 100 Mg Tabs (Sitagliptin phosphate) .... Take 1 tablet by mouth once a day 14)  Co Q-10 200 Mg Caps (Coenzyme q10) .... Take 1 tablet by mouth once a day 15)  Cyclobenzaprine Hcl 5 Mg Tabs (Cyclobenzaprine hcl) .... One by mouth at bedtime as needed for muscle spasms  Other Orders: Influenza Vaccine MCR [id number]) Admin 1st Vaccine [id number])  Patient Instructions: 1)  Work with physical therapy for back pain.   2)  Call our office if your symptoms do not  improve or gets worse. Prescriptions: CYCLOBENZAPRINE HCL 5 MG TABS (CYCLOBENZAPRINE HCL) one by mouth at bedtime as needed for muscle spasms  #15 x 0   Entered and Authorized by:   D. Drema Pry DO   Signed by:   D. Drema Pry DO on 06/13/2009   Method used:   Electronically to        [facility] # [phone]* (retail)       [address]       [place], [place]  [address]       Ph: [phone]       Fax: [phone]   RxID:   [id number]   Current Allergies (reviewed today): ! SULFA ! TETRACYCLINE ! ASPIRIN ! * HORMONES ! ADVAIR DISKUS (FLUTICASONE-SALMETEROL) ! LISINOPRIL ! * LATEX GLUCOPHAGE * IVP DYE   Immunizations Administered:  Influenza Vaccine # 1:    Vaccine Type: Fluvax MCR    Site: left deltoid    Mfr: GlaxoSmithKline    Dose: 0.5 ml    Route: IM    Given by: Jiles Garter CMA    Exp. Date: 03/06/2010    Lot #: [id number]     VIS given: 04/16/2009  Flu Vaccine Consent Questions:    Do you have a history of severe allergic reactions to this vaccine? no    Any prior history of allergic reactions to egg and/or gelatin? no    Do you have a sensitivity to the preservative Thimersol? no    Do you have a past history of Guillan-Barre Syndrome? no    Do you currently have an acute febrile illness? no    Have you ever had a severe reaction to latex? no    Vaccine information given and explained to patient? yes    Are  you currently pregnant? no

## 2010-10-09 NOTE — Letter (Signed)
Summary: Statement of Medical Necessity/ [place] [facility] Necess[facility]/ [place] By: Rise Patience 07/07/2010 09:04:20  _____________________________________________________________________  External Attachment:    Type:   Image     Comment:   External Document

## 2010-10-09 NOTE — Assessment & Plan Note (Signed)
Summary: 3 month follow up/mhf   Vital Signs:  Patient profile:   76 year old female Height:      62 inches Weight:      259.50 pounds BMI:     47.63 O2 Sat:      96 % on Room air Temp:     97.9 degrees F Pulse rate:   76 / minute Pulse rhythm:   regular Resp:     16 per minute BP sitting:   140 / 70  (right arm) Cuff size:   large  Vitals Entered By: Jiles Garter CMA (March 31, 2010 10:18 AM)  O2 Flow:  Room air CC: Rm 3- 3 Month Follow up disease manaagement, Type 2 diabetes mellitus follow-up Is Patient Diabetic? Yes Did you bring your meter with you today? No Pain Assessment Patient in pain? no      Comments low blood sugar 188 high 250 +, unresolved cellulitis in lower legs, discuss alternatives for Coumadin   Primary Care Provider:  DDrema Pry DO  CC:  Rm 3- 3 Month Follow up disease manaagement and Type 2 diabetes mellitus follow-up.  History of Present Illness:  Hypertension Follow-Up      This is a 76 year old woman who presents for Hypertension follow-up.  The patient reports edema, but denies lightheadedness.  The patient denies the following associated symptoms: chest pain.  Compliance with medications (by patient report) has been near 100%.    Type 2 Diabetes Mellitus Follow-Up      The patient is also here for Type 2 diabetes mellitus follow-up.  The patient denies blurred vision.  The patient denies the following symptoms: chest pain.  Since the last visit the patient reports good dietary compliance, compliance with medications, not exercising regularly, and monitoring blood glucose.    Allergies: 1)  ! Sulfa 2)  ! Tetracycline 3)  ! Aspirin 4)  ! * Hormones 5)  ! Advair Diskus (Fluticasone-Salmeterol) 6)  ! Lisinopril 7)  ! * Latex 8)  Glucophage 9)  * Ivp Dye  Past History:  Past Medical History: Current Problems:  HYPOTHYROIDISM (ICD-244.9)  HYPERCHOLESTEROLEMIA (ICD-272.0)   HYPERTENSION (ICD-401.9)   SYNCOPE AND COLLAPSE (ICD-780.2)     DIABETES MELLITUS (ICD-250.00) DEPRESSION (ICD-311)  ASTHMA, CHILDHOOD (ICD-493.00)  - no problem since adulthood Allergic rhinitis obesity  panic attacks / anxiety Acute hypoxic respiratory failure secondary to multiple bilateral pulmonary embolism 09/2008 Negative hypercoagulable workup 09/2008 hospital stay Severe obstructive sleep apnea    Past Surgical History: thyroid surgery  Family History: Family History of Arthritis Family History Diabetes 1st degree relative Family History High cholesterol Family History Hypertension Family History of  CAD Family History of  stroke Family History of  emotional/mental illness daughters-arthritis, allergies mother-asthma and allergies, clotting disorder, arthritis mat grandmother- clotting dx, rheumatism mat uncle-clotting dx  daughter-clotting dx, allergies            Physical Exam  General:  alert, well-developed, and well-nourished.   Lungs:  normal respiratory effort and normal breath sounds.   Heart:  normal rate, regular rhythm, and no gallop.   Extremities:  1+ left pedal edema and 1+ right pedal edema.   Neurologic:  cranial nerves II-XII intact and gait normal.   Skin:  mild  redness of lower ext Psych:  normally interactive and good eye contact.     Impression & Recommendations:  Problem # 1:  LEG EDEMA, CHRONIC (ICD-782.3) pt has chronic LE venous stasis dermatitis.  add lasix  Her updated medication list for this problem includes:    Micardis Hct 40-12.5 Mg Tabs (Telmisartan-hctz) ..... One by mouth once daily    Furosemide 20 Mg Tabs (Furosemide) ..... One by mouth qam  Problem # 2:  DIABETES MELLITUS (ICD-250.00) she is followed by Dr. Suzette Battiest but reports elevated blood sugars.  consider DC actos due to edema.  add nateglinide before meals.  whe discussed risk of hypoglycemia  Her updated medication list for this problem includes:    Actos 15 Mg Tabs (Pioglitazone hcl) ..... One by mouth once daily     Micardis Hct 40-12.5 Mg Tabs (Telmisartan-hctz) ..... One by mouth once daily    Januvia 100 Mg Tabs (Sitagliptin phosphate) .Marland Kitchen... Take 1 tablet by mouth once a day    Nateglinide 60 Mg Tabs (Nateglinide) ..... One by mouth three times a day before meals  Labs Reviewed: Creat: 1.0 (08/16/2009)     Last Eye Exam: normal (08/07/2009) Reviewed HgBA1c results: 6.8 (08/16/2009)  7.7 (05/20/2009)  Complete Medication List: 1)  Synthroid 100 Mcg Tabs (Levothyroxine sodium) .... Take 1 tablet by mouth once a day 2)  Fexofenadine Hcl 180 Mg Tabs (Fexofenadine hcl) .... Take 1 tablet by mouth once a day 3)  Sertraline Hcl 50 Mg Tabs (Sertraline hcl) .... Take 1 tablet by mouth once a day 4)  Actos 15 Mg Tabs (Pioglitazone hcl) .... One by mouth once daily 5)  Micardis Hct 40-12.5 Mg Tabs (Telmisartan-hctz) .... One by mouth once daily 6)  Zantac 150 Maximum Strength 150 Mg Tabs (Ranitidine hcl) .... One tab by mouth once daily as needed 7)  Multivitamins Tabs (Multiple vitamin) .... One by mouth once daily 8)  Vitamin D 2000 Unit Caps (Cholecalciferol) .... Take 1 capsule by mouth once a day 9)  Fish Oil 1000 Mg Caps (Omega-3 fatty acids) .... Take 2  tablet by mouth two times a day 10)  Crestor 20 Mg Tabs (Rosuvastatin calcium) .... Take 1 tablet by mouth once a day 11)  Auto Cpap  .... 5-15 12)  Coumadin 5 Mg Tabs (Warfarin sodium) .... As directed 13)  Januvia 100 Mg Tabs (Sitagliptin phosphate) .... Take 1 tablet by mouth once a day 14)  Co Q-10 200 Mg Caps (Coenzyme q10) .... Take 1 tablet by mouth once a day 15)  Nateglinide 60 Mg Tabs (Nateglinide) .... One by mouth three times a day before meals 16)  Furosemide 20 Mg Tabs (Furosemide) .... One by mouth qam  Patient Instructions: 1)  Please schedule a follow-up appointment in 1 month. 2)  BMP prior to visit, ICD-9: 401.9 3)  HbgA1C prior to visit, ICD-9: 250.00 4)  Urine Microalbumin prior to visit, ICD-9: 250.00 5)  TSH:  272.4 6)   Please return for lab work one (1) week before your next appointment.  7)  The highlighted prescriptions were electronically sent to your pharmacy Prescriptions: FUROSEMIDE 20 MG TABS (FUROSEMIDE) one by mouth qam  #30 x 1   Entered and Authorized by:   D. Drema Pry DO   Signed by:   D. Drema Pry DO on 03/31/2010   Method used:   Electronically to        [facility] # [phone]* (retail)       [address]       [place], [place]  [address]       Ph: [phone]       Fax: [phone]   RxID:   [id number] NATEGLINIDE 60 MG TABS (  NATEGLINIDE) one by mouth three times a day before meals  #90 x 1   Entered and Authorized by:   D. Drema Pry DO   Signed by:   D. Drema Pry DO on 03/31/2010   Method used:   Electronically to        [facility] # [phone]* (retail)       [address]       [place], [place]  [address]       Ph: [phone]       Fax: [phone]   [place]:   [phone]   Current Allergies (reviewed today): ! SULFA ! TETRACYCLINE ! ASPIRIN ! * HORMONES ! ADVAIR DISKUS (FLUTICASONE-SALMETEROL) ! LISINOPRIL ! * LATEX GLUCOPHAGE * IVP DYE   Preventive Care Screening  Mammogram:    Date:  10/22/2009    Results:  normal

## 2010-10-09 NOTE — Letter (Signed)
Summary: Office Progress Note / [facility]  Office Progress Note / [facility]   Imported By: Rise Patience 07/22/2009 14:16:12  _____________________________________________________________________  External Attachment:    Type:   Image     Comment:   External Document

## 2010-10-09 NOTE — Assessment & Plan Note (Signed)
Summary: 3 week return/mhh   Visit Type:  Follow-up Copy to:  Dr. Asencion Noble Primary Provider/Referring Provider:  D. Drema Pry DO  CC:  3 week follow up with CXR. No new complaints. Pt wants to discuss Fish oil therapy.  History of Present Illness: 74/F with BL idiopathic PE 1/10 & severe obstructive sleep apnea presents for post hospital FU. On nocturnal O2 & CPAP, now on O2 daytime too. PE occurred while on Pro-gon B (hormone supplement, sub lingual) DIagnostic PSG in 8/09 showed severe obstructive sleep apnea with AHI 85/h, TST 121 mins - no REM sleep. Subsequent titration study showed CPAP requirement of 15 cm with small comfort gel mask Arville Go). However she felt that this pressure was too high & was not very compliant with CPAP. After PE, oximetry showed desaturation to 77% & oxygen 2 L [place] was started  [facility]). Desaturation (without CPAP) could be on the basis of PE or obstructive sleep apnea .  3/10>> autoCPAP 5-15 with 3 L O2 blended in 03/05/09 >> Reviewed ONO >> desatn less than 88% for 35 mins, sustained during ? REM sleep, ct O2  with CPAP during sleep  July 04, 2010 9:35 AM  Last OV 9/10,Download shows  poor compliance on CPAP until aug'11. She wants to switch to [facility] from [facility]  - they supply O2, loaner from [facility] , pr down to 10 cm Hospitalised 10/2-13/11 for pneumnia, hypoxia, CXR - improved BL ASD Desatn on exertion  July 28, 2010 12:23 PM  Continues to desatn on exertion, feeling back to baseline, no cough/ wheeze, questions need for fish oil Pt denies any significant sore throat, nasal congestion or excess secretions, fever, chills, sweats, unintended weight loss, pleurtic or exertional chest pain, orthopnea PND, or leg swelling   Preventive Screening-Counseling & Management  Alcohol-Tobacco     Smoking Status: quit     Year Quit: 1991     Pack years: 76yrs x 3/4ppd     Passive Smoke Exposure: yes  Current Medications (verified): 1)  Synthroid 100 Mcg  Tabs (Levothyroxine Sodium) .... Take 1 Tablet By Mouth Once A Day 2)  Fexofenadine Hcl 180 Mg Tabs (Fexofenadine Hcl) .... Take 1 Tablet By Mouth Once A Day 3)  Sertraline Hcl 50 Mg Tabs (Sertraline Hcl) .... Take 1 Tablet By Mouth Once A Day 4)  Zantac 150 Maximum Strength 150 Mg Tabs (Ranitidine Hcl) .... One Tab By Mouth Once Daily As Needed 5)  Multivitamins  Tabs (Multiple Vitamin) .... One By Mouth Once Daily 6)  Vitamin D 2000 Unit Caps (Cholecalciferol) .... Take 1 Capsule By Mouth Once A Day 7)  Fish Oil 1000 Mg Caps (Omega-3 Fatty Acids) .... Take 2  Tablet By Mouth Two Times A Day 8)  Crestor 20 Mg Tabs (Rosuvastatin Calcium) .... Take 1 Tablet By Mouth Once A Day 9)  Cpap .Marland Kitchen.. 10 Ahc 10)  Coumadin 5 Mg Tabs (Warfarin Sodium) .... As Directed 11)  Co Q-10 200 Mg Caps (Coenzyme Q10) .... Take 1 Tablet By Mouth Once A Day 12)  Nateglinide 60 Mg Tabs (Nateglinide) .... One Tab At [facility] Before Meals As Needed 13)  Furosemide 20 Mg Tabs (Furosemide) .... 2  Tabs By Mouth Once Daily 14)  Pen Needles 1/2" 29g X 56mm Misc (Insulin Pen Needle) .... Use Once Daily As Directed 15)  Novolog Mix 70/30 Flexpen 70-30 % Susp (Insulin Aspart Prot & Aspart) .... 30 Units Two Times A Day 16)  Losartan Potassium 50 Mg Tabs (  Losartan Potassium) .... One By Mouth Once Daily  Allergies (verified): 1)  ! Sulfa 2)  ! Tetracycline 3)  ! Aspirin 4)  ! * Hormones 5)  ! Advair Diskus (Fluticasone-Salmeterol) 6)  ! Lisinopril 7)  ! * Latex 8)  Glucophage 9)  * Ivp Dye  Past History:  Past Medical History: Last updated: 07/09/2010 Current Problems:  HYPOTHYROIDISM (ICD-244.9)  HYPERCHOLESTEROLEMIA (ICD-272.0)   HYPERTENSION (ICD-401.9)   SYNCOPE AND COLLAPSE (ICD-780.2)      DIABETES MELLITUS (ICD-250.00) DEPRESSION (ICD-311)   ASTHMA, CHILDHOOD (ICD-493.00)  - no problem since adulthood Allergic rhinitis obesity  panic attacks / anxiety Acute hypoxic respiratory failure secondary to multiple  bilateral pulmonary embolism 09/2008 Negative hypercoagulable workup 09/2008 hospital stay Severe obstructive sleep apnea    Social History: Last updated: 07/09/2010 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 Former Smoker Quit smoking 1991.  Smoked off and on x 20 yrs. Alcohol use-no   Drug use-no               Review of Systems       The patient complains of dyspnea on exertion.  The patient denies anorexia, fever, weight loss, weight gain, vision loss, decreased hearing, hoarseness, chest pain, syncope, peripheral edema, prolonged cough, headaches, hemoptysis, abdominal pain, melena, hematochezia, severe indigestion/heartburn, hematuria, muscle weakness, suspicious skin lesions, transient blindness, difficulty walking, depression, unusual weight change, abnormal bleeding, enlarged lymph nodes, and angioedema.    Vital Signs:  Patient profile:   76 year old female Height:      62 inches Weight:      255 pounds BMI:     46.81 O2 Sat:      95 % on Room air Temp:     98.1 degrees F oral Pulse rate:   60 / minute BP sitting:   126 / 72  (left arm) Cuff size:   large  Vitals Entered By: Iran Planas CMA (July 28, 2010 11:38 AM)  O2 Flow:  Room air  Serial Vital Signs/Assessments:  Comments: Ambulatory Pulse Oximetry  Resting; HR__57___    02 Sat__93%RA___  Lap1 (185 feet)   HR_____   02 Sat_____ Lap2 (185 feet)   HR_____   02 Sat_____    Lap3 (185 feet)   HR_____   02 Sat_____  ___Test Completed without Difficulty _x__Test Stopped due to: dizziness and desat to 86%RA  P-90 after walking approx 5ft. Pt recovered to 95% P-70 at end of walk. Iran Planas CMA  July 28, 2010 12:37 PM    By: Iran Planas CMA   CC: 3 week follow up with CXR. No new complaints. Pt wants to discuss Fish oil therapy Comments Medications reviewed with patient Verified contact number and pharmacy with patient Iran Planas [facility]  July 28, 2010 11:38 AM    Physical Exam  Additional Exam:  wt 252 July 04, 2010  Gen. Pleasant, well-nourished, in no distress, normal affect ENT - no lesions, no post nasal drip Neck: No JVD, no thyromegaly, no carotid bruits Lungs: no use of accessory muscles, no dullness to percussion, clear without rales or rhonchi  Cardiovascular: Rhythm regular, heart sounds  normal, no murmurs or gallops, 2+ peripheral edema Musculoskeletal: No deformities, no cyanosis or clubbing      CXR  Procedure date:  07/28/2010  Findings:      IMPRESSION:   1.  Persistent bibasilar airspace disease without marked change since the most recent plain films. 2.  Cardiomegaly.  Impression & Recommendations:  Problem # 1:  PNEUMONIA (ICD-486) Assessment Improved  Nearl resolved FU  CXR in 3-4 monohs  Orders: Est. Patient Level III [id number])  Problem # 2:  SLEEP APNEA (ICD-780.57)  review download Compliance encouraged, wt loss emphasized, asked to avoid meds with sedative side effects, cautioned against driving when sleepy.    Orders: Est. Patient Level III [id number])  Problem # 3:  HYPOXEMIA (ICD-799.02)  Continues to require O2 on exerion Reassess in  3 months Maybe related to h/o PE & acute worsening due to pneumonia  Orders: Est. Patient Level III [id number])  Medications Added to Medication List This Visit: 1)  Cpap  .Marland Kitchen.. 10 ahc  Patient Instructions: 1)  Copy sent to: Dr Shawna Orleans 2)  Odette Horns shows resolved pneumonia 3)  Stay on CPAP - we will check download to make sure settings ok 4)  OK to come off oxygen if satn above 88% on walking -STAY on O2  5)  Please schedule a follow-up appointment in 4 months.    Appended Document: 3 week return/mhh download 11/7-15/11 > 10 cm adequate, leak +, I am glad she is using her CPAP more, have her check mask fit to make sure no leak  Appended Document: 3 week return/mhh ATC was advised pt will return around 1pm. Will call back later.   Appended  Document: 3 week return/mhh lmomtcb x 1.  Appended Document: 3 week return/mhh pt aware.

## 2010-10-09 NOTE — Progress Notes (Signed)
Summary: Unable to tolerate medication  Phone Note Call from Patient Call back at Home Phone [phone]   Caller: Patient Summary of Call: patient called stating she is having trouble tolerating medication and would like to know if she could change the medication  Follow-up for Phone Call        attempted to return call to patient at [phone] to clarify message, no answer voice message left to call. Follow-up by: Jiles Garter CMA,  Jan 22, 2009 4:32 PM  Additional Follow-up for Phone Call Additional follow up Details #1::        Patient states she can not tolerate the Jaumet  she has had an increase in nausea and diarrhea. She states she has taken Zantac and has gotten some relief from the nausea. She  went back to the reguar Januvia once a day.  Additional Follow-up by: Jiles Garter CMA,  Jan 24, 2009 1:22 PM    Additional Follow-up for Phone Call Additional follow up Details #2::    rx changed to januvia 100.   Follow-up by: D. Drema Pry DO,  Jan 24, 2009 4:53 PM  Additional Follow-up for Phone Call Additional follow up Details #3:: Details for Additional Follow-up Action Taken: patient advised per Dr Shawna Orleans instructions Additional Follow-up by: Jiles Garter CMA,  Jan 24, 2009 5:02 PM  New/Updated Medications: JANUVIA 100 MG TABS (SITAGLIPTIN PHOSPHATE) one by mouth qd   Prescriptions: JANUVIA 100 MG TABS (SITAGLIPTIN PHOSPHATE) one by mouth qd  #30 x 3   Entered and Authorized by:   D. Drema Pry DO   Signed by:   D. Drema Pry DO on 01/24/2009   Method used:   Electronically to        [facility] # [phone]* (retail)       [address]       [place], [place]  [address]       Ph: [phone]       Fax: [phone]   RxID:   [id number]

## 2010-10-09 NOTE — Letter (Signed)
Summary: [name]   Imported By: Phillis Knack 06/05/2009 08:39:20  _____________________________________________________________________  External Attachment:    Type:   Image     Comment:   External Document

## 2010-10-09 NOTE — Progress Notes (Signed)
Summary: [facility]  Phone Note Call from Patient Call back at Home Phone [phone]   Caller: Patient Call For: alva Summary of Call: Pt no-showed on her appt yesterday, states she just got out of the hospital, wants an appt asap, pls advise. Initial call taken by: Netta Neat,  July 02, 2010 9:45 AM  Follow-up for Phone Call        pt scheduled to see RA on 07-04-10 at 9:15. appt ok per JR. Pt awareJennifer Harvest Dark CMA  July 02, 2010 10:11 AM

## 2010-10-09 NOTE — Letter (Signed)
Summary: [facility]   Imported By: Edmonia James 05/16/2010 10:48:44  _____________________________________________________________________  External Attachment:    Type:   Image     Comment:   External Document

## 2010-10-09 NOTE — Progress Notes (Signed)
Summary: Medication Status  Phone Note Call from Patient Call back at Home Phone [phone]   Caller: Patient Call For: D. Drema Pry DO Summary of Call: patient called and left voice message stating she was given a new rx for Lasix at her office visit, whe she went to pick up the medication, the pharmacist informed her that she was already taking Micardis/HCT and she should check with the physician to make sure she it to take the current rx provided. Her message states that she will not take the Micardis/HCT until she hears back from Dr Shawna Orleans. Initial call taken by: Jiles Garter CMA,  April 01, 2010 10:36 AM  Follow-up for Phone Call        it is ok to take both micardis/hct and lasix.    Follow-up by: D. Drema Pry DO,  April 01, 2010 12:20 PM  Additional Follow-up for Phone Call Additional follow up Details #1::        call returned to patient at [phone], no answer. A detailed voice message was left informing patient per Dr Shawna Orleans instructions. Message was left for patient to call if any questions Additional Follow-up by: Jiles Garter CMA,  April 01, 2010 1:25 PM

## 2010-10-09 NOTE — Assessment & Plan Note (Signed)
Summary: F/u for after in hospital, Dr. Redmond Pulling pt.   Vital Signs:  Patient Profile:   76 Years Old Female Height:     63 inches Weight:      254 pounds BMI:     45.16 O2 treatment:    Room Air Temp:     97.9 degrees F oral Pulse rate:   64 / minute Pulse rhythm:   regular Resp:     18 per minute BP sitting:   140 / 64  (left arm) Cuff size:   large  Vitals Entered By: Marcelino Freestone RMA (October 11, 2008 2:22 PM)                 PCP:  Orinda Kenner MD  Chief Complaint:  follow up from hospital.  History of Present Illness: 76 year old white female for hospital follow-up.  The patient admitted on September 27, 2008 secondary acute hypoxic respiratory failure.  CT angio of the chest revealed multiple bilateral pulmonary emboli with large clot burden.  Patient was anticoagulated  with heparin and coumadin.  She elected to take part in [place] study.  She is taking rivaroxaban.  Patient's daughter has history of DVT and there was concern for hypercoagulable disorder.  Hypercoagulable panel was essentially normal with the exception of slightly elevated protein C level.  Patient was discharged with oxygen due to hypoxia with ambulation.  Patient denies shortness of breath.  Patient denies abnormal bleeding.  She has routine labs with Dr. Joya Gaskins.  During her hospitalization patient also had exacerbation of anxiety.  She was seen by Dr. Rhona Raider and he recommended increasing the patient's sertraline to 100 mg.  He also recommended outpatient psychiatry follow-up.  Diabetes mellitus-blood sugars were stable.  Her A1c noted to be 7.1.  Lipid panel showed cholesterol 256, triglycerides 302, HDL 33, LDL of 163.  Hypertension - patient was hypotensive during her hospitalization. Avapro and aldactone were discontinued.  She denies dizziness.  She denies chest pain.    Current Allergies (reviewed today): ! GLUCOPHAGE ! SULFA ! TETRACYCLINE ! ASPIRIN ! * HORMONES ! 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  The patient denies fever, chest pain, abdominal pain, severe indigestion/heartburn, difficulty walking, and abnormal bleeding.         All other systems negative.    Physical Exam  General:     alert and overweight-appearing.   Head:     normocephalic and atraumatic.   Eyes:     pupils equal, pupils round, and pupils reactive to light.   Mouth:     pharynx pink and moist.   Neck:     supple and no masses.   Lungs:  normal respiratory effort and normal breath sounds.   Heart:     normal rate, regular rhythm, and no gallop.   Abdomen:     soft and non-tender.   Extremities:     1+ left pedal edema and 1+ right pedal edema.   Neurologic:     cranial nerves II-XII intact and gait normal.   Skin:     diffuse maculopapular rash over back chest abdomen and upper legs. Psych:     normally interactive, good eye contact, and slightly anxious.      Impression & Recommendations:  Problem # 1:  PULMONARY EMBOLISM (ICD-64.107) 76 year old recently admitted for acute hypoxic respiratory failure secondary to bilateral pulmonary emboli.  Patient is anticoagulated with direct thrombin inhibitor.  Maintain anticoagulation x 1 year.  Obtain CT of abd and pelvis to rule out malignancy.  Patient with  mild hypoxia with exertion.  I advised continuing oxygen as needed for 2 additional weeks.  At next visit with Dr. Redmond Pulling we can reassess need for ongoing oxygen therapy.  Orders: Radiology Referral (Radiology)   Problem # 2:  PANIC ATTACK (ICD-300.01) Sertraline increased to 100 mg  during hospitalization by Dr. Rhona Raider.  Patient advised to follow-up with psychiatry. Her updated medication list for this problem includes:    Sertraline Hcl 100 Mg Tabs (Sertraline hcl) ..... One by mouth once daily   Problem # 3:  DERMATITIS (ICD-692.9) Patient developed diffuse rash shortly after discharge from hospital.  I suspect drug rash.  It is unclear whether dermatitis was triggered by advair versus rivaroxaban.   Her rash is fading.   If rash worsens, we may need to discontinue rivaroxaban.  Patient advised to contact our office if rash worsens.  Her updated medication list for this problem includes:    Fexofenadine Hcl 180 Mg Tabs (Fexofenadine hcl) .Marland Kitchen... Take 1 tablet by mouth once a day   Problem # 4:  DIABETES MELLITUS (ICD-250.00) A1c during hospitalization was 7.1.  Patient has lower extremity edema which may be partly attributable to  Actos.  I suggested follow up with Dr. Redmond Pulling.  Consider decreasing Actos dose and adding Januvia.  Patient has not tolerated metformin in the past. Her updated medication list for this problem includes:    Avapro 150 Mg Tabs (Irbesartan) .Marland Kitchen... 1/2 tablet at night    Actos 45 Mg Tabs (Pioglitazone hcl) .Marland Kitchen... Take 1 tablet by mouth once a day]   Problem # 5:  HYPERCHOLESTEROLEMIA (ICD-272.0) Lipid panel from hospital reviewed.   LDL is suboptimal.   Unclear if pt has been taking her crestor.   Consider increase Crestor dose to 20 mg.   Crestor may also have some effect in reducing risk of DVT.  Her updated medication list for this problem includes:    Crestor 10 Mg Tabs (Rosuvastatin calcium) .Marland Kitchen... Take 1 tablet by mouth once a day   Problem # 6:   HYPERTENSION (ICD-401.9) BP with manual cuff 130/60.  Continue to observe off BP meds.  Pt hypotensive during hopitalization.   Restart ARB if BP rises. Her updated medication list for this problem includes:    Avapro 150 Mg Tabs (Irbesartan) .Marland Kitchen... 1/2 tablet at night  BP today: 140/64 Prior BP: 122/60 (09/27/2008)   Complete Medication List: 1)  Synthroid 100 Mcg Tabs (Levothyroxine sodium) .... Take 1 tablet by mouth once a day 2)  Fexofenadine Hcl 180 Mg Tabs (Fexofenadine hcl) .... Take 1 tablet by mouth once a day 3)  Sertraline Hcl 100  Mg Tabs (Sertraline hcl) .... One by mouth once daily 4)  Avapro 150 Mg Tabs (Irbesartan) .... 1/2 tablet at night 5)  Actos 45 Mg Tabs (Pioglitazone hcl) .... Take 1 tablet by mouth once a day] 6)  Armour Thyroid 60 Mg Tabs (Thyroid) .... One tab by mouth once daily 7)  Crestor 10 Mg Tabs (Rosuvastatin calcium) .... Take 1 tablet by mouth once a day   Patient Instructions: 1)  Please schedule a follow-up appointment in 2 weeks Dr. Redmond Pulling.  Appended Document: F/u for after in hospital, Dr. Redmond Pulling pt. Called pt - actos decreased to 1/2 45 mg considering lower ext edema (fluid retention).   Pt higher risk of developing CHF given recent issues with bilateral pulm embolism.   Start Januvia 100 mg by mouth q am.   Also arrange CT of abd and pelvis 2 wks from now.

## 2010-10-09 NOTE — Assessment & Plan Note (Signed)
Summary: CHECK FOR INFECTION AND THYROID PER  DR Chalmers Cater /[facility]   Vital Signs:  Patient profile:   76 year old female Height:      62 inches Weight:      251.75 pounds BMI:     46.21 O2 Sat:      94 % on Room air Temp:     97.8 degrees F oral Pulse rate:   88 / minute Resp:     22 per minute BP sitting:   132 / 60  (right arm) Cuff size:   large  Vitals Entered By: Jiles Garter CMA (August 05, 2010 3:28 PM)  O2 Flow:  Room air CC: follow-up visit per Dr Chalmers Cater Is Patient Diabetic? Yes Pain Assessment Patient in pain? no      Comments discuss Insulin usage, elevated blood sugar- per Dr Chalmers Cater check for underlying infection due to insulin resistance and elevated blood sugar, request blood work and thyroid level- blood sugar 495 fasting, and after insulin blood 401   Primary Care Provider:  Jennings Books DO  CC:  follow-up visit per Dr Chalmers Cater.  History of Present Illness: 76 y/o white female with complex med hx for f/u pt has been struggling with worsening hyperglycemia her endocrinologist concerned pt may have occult infection triggering hyperglycemia  pt occ drinking cranberry juice she denies pulmonary symptoms no fever or chills  Preventive Screening-Counseling & Management  Alcohol-Tobacco     Smoking Status: quit  Allergies: 1)  ! Sulfa 2)  ! Tetracycline 3)  ! Aspirin 4)  ! * Hormones 5)  ! Advair Diskus (Fluticasone-Salmeterol) 6)  ! Lisinopril 7)  ! * Latex 8)  Glucophage 9)  * Ivp Dye  Past History:  Past Medical History: Current Problems:  HYPOTHYROIDISM (ICD-244.9)  HYPERCHOLESTEROLEMIA (ICD-272.0)   HYPERTENSION (ICD-401.9)   SYNCOPE AND COLLAPSE (ICD-780.2)      DIABETES MELLITUS (ICD-250.00)  DEPRESSION (ICD-311)   ASTHMA, CHILDHOOD (ICD-493.00)  - no problem since adulthood Allergic rhinitis obesity  panic attacks / anxiety Acute hypoxic respiratory failure secondary to multiple bilateral pulmonary embolism 09/2008 Negative  hypercoagulable workup 09/2008 hospital stay Severe obstructive sleep apnea    Family History: Family History of Arthritis Family History Diabetes 1st degree relative Family History High cholesterol Family History Hypertension Family History of  CAD Family History of  stroke Family History of  emotional/mental illness daughters-arthritis, allergies mother-asthma and allergies, clotting disorder, arthritis mat grandmother- clotting dx, rheumatism mat uncle-clotting dx  daughter-clotting dx, allergies              Social History: 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 Former Smoker Quit smoking 1991.  Smoked off and on x 20 yrs. Alcohol use-no   Drug use-no                Physical Exam  General:  alert and overweight-appearing.   Eyes:  pupils equal, pupils round, and pupils reactive to light.   Mouth:  good dentition and pharynx pink and moist.   Neck:  No deformities, masses, or tenderness noted. Lungs:  normal respiratory effort, normal breath sounds, and no wheezes.   Heart:  normal rate, regular rhythm, and no gallop.   Abdomen:  soft, non-tender, and normal bowel sounds.   Extremities:  trace left pedal edema and trace right pedal edema.     Impression & Recommendations:  Problem # 1:  DIABETES MELLITUS, TYPE II, UNCONTROLLED (ICD-250.02)  Endocrinologist worried pt may have  underlying infection triggering hyperglycemia normal physical exam check labs, u/a avoid fruit juices question poor absorption of insulin.   change injection site  The following medications were removed from the medication list:    Novolog Mix 70/30 Flexpen 70-30 % Susp (Insulin aspart prot & aspart) .Marland KitchenMarland KitchenMarland KitchenMarland Kitchen 30 units two times a day Her updated medication list for this problem includes:    Nateglinide 60 Mg Tabs (Nateglinide) ..... One tab at noon before meals as needed    Losartan Potassium 50 Mg Tabs (Losartan potassium) ..... One by mouth once daily     Humalog Mix 75/25 75-25 % Susp (Insulin lispro prot & lispro) ..... Inject 60 units subcutaneously two times a day with meals as directed  Orders: T-Basic Metabolic Panel ([id number])  Problem # 2:  SHORTNESS OF BREATH (ICD-786.05) Assessment: Unchanged  Complete Medication List: 1)  Synthroid 100 Mcg Tabs (Levothyroxine sodium) .... Take 1 tablet by mouth once a day 2)  Fexofenadine Hcl 180 Mg Tabs (Fexofenadine hcl) .... Take 1 tablet by mouth once a day 3)  Sertraline Hcl 50 Mg Tabs (Sertraline hcl) .... Take 1 tablet by mouth once a day 4)  Zantac 150 Maximum Strength 150 Mg Tabs (Ranitidine hcl) .... One tab by mouth once daily as needed 5)  Multivitamins Tabs (Multiple vitamin) .... One by mouth once daily 6)  Vitamin D 2000 Unit Caps (Cholecalciferol) .... Take 1 capsule by mouth once a day 7)  Fish Oil 1000 Mg Caps (Omega-3 fatty acids) .... Take 2  tablet by mouth two times a day 8)  Crestor 20 Mg Tabs (Rosuvastatin calcium) .... Take 1 tablet by mouth once a day 9)  Cpap  .Marland Kitchen.. 10 ahc 10)  Coumadin 5 Mg Tabs (Warfarin sodium) .... As directed 11)  Co Q-10 200 Mg Caps (Coenzyme q10) .... Take 1 tablet by mouth once a day 12)  Nateglinide 60 Mg Tabs (Nateglinide) .... One tab at noon before meals as needed 13)  Furosemide 20 Mg Tabs (Furosemide) .... 2  tabs by mouth once daily 14)  Pen Needles 1/2" 29g X 2mm Misc (Insulin pen needle) .... Use once daily as directed 15)  Losartan Potassium 50 Mg Tabs (Losartan potassium) .... One by mouth once daily 16)  Humalog Mix 75/25 75-25 % Susp (Insulin lispro prot & lispro) .... Inject 60 units subcutaneously two times a day with meals as directed  Other Orders: T-CBC w/Diff [id number]) T-Sed Rate (Automated) [phone]) T-T4, Free [phone]) T-TSH [phone]) T-Culture, Urine [id number]) Specimen Handling (99000) UA Dipstick w/o Micro (manual) [id number])  Patient Instructions: 1)  Please follow up with Dr. Chalmers Cater re:    elevated blood sugars 2)  You can try administering insulin in your arms or legs  3)  Use longer insulin pen needles   Orders Added: 1)  T-Basic Metabolic Panel 0000000 2)  T-CBC w/Diff [id number] 3)  T-Sed Rate (Automated) [id number] 4)  T-T4, Free [id number] 5)  T-TSH [id number] 6)  T-Culture, Urine [id number] 7)  Specimen Handling [99000] 8)  UA Dipstick w/o Micro (manual) [81002] 9)  Est. Patient Level III [id number]     Laboratory Results   Urine Tests    Routine Urinalysis   Color: yellow Appearance: Cloudy Glucose: >=1000   (Normal Range: Negative) Bilirubin: negative   (Normal Range: Negative) Ketone: negative   (Normal Range: Negative) Spec. Gravity: 1.010   (Normal Range: 1.003-1.035) Blood: negative   (Normal Range: Negative) pH: 5.0   (Normal Range: 5.0-8.0) Protein: negative   (Normal  Range: Negative) Urobilinogen: 0.2   (Normal Range: 0-1) Nitrite: negative   (Normal Range: Negative) Leukocyte Esterace: moderate   (Normal Range: Negative)

## 2010-10-09 NOTE — Assessment & Plan Note (Signed)
Summary: Acute NP office visit - URI   Copy to:  Dr. Asencion Noble Primary Provider/Referring Provider:  Jennings Books DO  CC:  DOE, prod cough with green/brown mucus, sinus pressure/congestion, and PND x5days - was given abx by PCP at onset of symptoms.  History of Present Illness: 74/F with BL idiopathic PE 1/10 & severe obstructive sleep apnea presents for post hospital FU. On nocturnal O2 & CPAP, now on O2 daytime too. PE occurred while on Pro-gon B (hormone supplement, sub lingual) DIagnostic PSG in 8/09 showed severe obstructive sleep apnea with AHI 85/h, TST 121 mins - no REM sleep. Subsequent titration study showed CPAP requirement of 15 cm with small comfort gel mask Arville Go). However she felt that this pressure was too high & was not very compliant with CPAP. After PE, oximetry showed desaturation to 77% & oxygen 2 L [place] was started  [facility]). Desaturation (without CPAP) could be on the basis of PE or obstructive sleep apnea .  3/10>> autoCPAP 5-15 with 3 L O2 blended in 03/05/09 >> Reviewed ONO >> desatn less than 88% for 35 mins, sustained during ? REM sleep, ct O2  with CPAP during sleep  July 04, 2010 9:35 AM  Last OV 9/10,Download shows  poor compliance on CPAP until aug'11. She wants to switch to [facility] from [facility]  - they supply O2, loaner from [facility] , pr down to 10 cm Hospitalised 10/2-13/11 for pneumnia, hypoxia, CXR - improved BL ASD Desatn on exertion  July 28, 2010 12:23 PM  Continues to desatn on exertion, feeling back to baseline, no cough/ wheeze, questions need for fish oil Pt denies any significant sore throat, nasal congestion or excess secretions, fever, chills, sweats, unintended weight loss, pleurtic or exertional chest pain, orthopnea PND, or leg swelling  September 22, 2010 --Presents for an acute office visit. Complains of DOE, prod cough with green/brown mucus, sinus pressure/congestion, PND x5days - was given abx by PCP at onset of symptoms-started on ceftin  has 2 days left. She is some better but not improved as she would like and still has coughwith green mucus. Denies chest pain, orthopnea, hemoptysis, fever, n/v/d, edema, headache     Medications Prior to Update: 1)  Synthroid 100 Mcg Tabs (Levothyroxine Sodium) .... Take 1 Tablet By Mouth Once A Day 2)  Fexofenadine Hcl 180 Mg Tabs (Fexofenadine Hcl) .... Take 1 Tablet By Mouth Once A Day 3)  Sertraline Hcl 100 Mg Tabs (Sertraline Hcl) .... One By Mouth Once Daily 4)  Zantac 150 Maximum Strength 150 Mg Tabs (Ranitidine Hcl) .... One Tab By Mouth Once Daily As Needed 5)  Multivitamins  Tabs (Multiple Vitamin) .... One By Mouth Once Daily 6)  Vitamin D 2000 Unit Caps (Cholecalciferol) .... Take 1 Capsule By Mouth Once A Day 7)  Fish Oil 1000 Mg Caps (Omega-3 Fatty Acids) .... Take 2  Tablet By Mouth Two Times A Day 8)  Crestor 20 Mg Tabs (Rosuvastatin Calcium) .... Take 1 Tablet By Mouth Once A Day 9)  Cpap .Marland Kitchen.. 10 Ahc 10)  Coumadin 5 Mg Tabs (Warfarin Sodium) .... As Directed 11)  Co Q-10 200 Mg Caps (Coenzyme Q10) .... Take 1 Tablet By Mouth Once A Day 12)  Nateglinide 60 Mg Tabs (Nateglinide) .... One Tab At [facility] Before Meals As Needed 13)  Furosemide 20 Mg Tabs (Furosemide) .... 2  Tabs By Mouth Once Daily 14)  Pen Needles 1/2" 29g X 11mm Misc (Insulin Pen Needle) .... Use Once Daily  As Directed 15)  Losartan Potassium 50 Mg Tabs (Losartan Potassium) .... One By Mouth Once Daily 16)  Humalog Mix 75/25 75-25 % Susp (Insulin Lispro Prot & Lispro) .... Inject 60 Units Subcutaneously Two Times A Day With Meals As Directed 17)  Ipratropium Bromide 0.03 % Soln (Ipratropium Bromide) .... 2 Sprays Each Nostril Three Times A Day As Needed 18)  Cefuroxime Axetil 500 Mg Tabs (Cefuroxime Axetil) .... One By Mouth Bid  Allergies (verified): 1)  ! Sulfa 2)  ! Tetracycline 3)  ! Aspirin 4)  ! * Hormones 5)  ! Advair Diskus (Fluticasone-Salmeterol) 6)  ! Lisinopril 7)  ! * Latex 8)  Glucophage 9)   * Ivp Dye  Past History:  Past Medical History: Last updated: 09/17/2010 Current Problems:  HYPOTHYROIDISM (ICD-244.9)  HYPERCHOLESTEROLEMIA (ICD-272.0)   HYPERTENSION (ICD-401.9)   SYNCOPE AND COLLAPSE (ICD-780.2)       DIABETES MELLITUS (ICD-250.00)  DEPRESSION (ICD-311)   ASTHMA, CHILDHOOD (ICD-493.00)  - no problem since adulthood Allergic rhinitis obesity  panic attacks / anxiety Acute hypoxic respiratory failure secondary to multiple bilateral pulmonary embolism 09/2008 Negative hypercoagulable workup 09/2008 hospital stay Severe obstructive sleep apnea    Past Surgical History: Last updated: 09/17/2010 thyroid surgery     Family History: Last updated: 09/17/2010 Family History of Arthritis Family History Diabetes 1st degree relative Family History High cholesterol Family History Hypertension Family History of  CAD Family History of  stroke Family History of  emotional/mental illness daughters-arthritis, allergies mother-asthma and allergies, clotting disorder, arthritis mat grandmother- clotting dx, rheumatism mat uncle-clotting dx  daughter-clotting dx, allergies               Social History: Last updated: 09/17/2010 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 Former Smoker Quit smoking 1991.  Smoked off and on x 20 yrs. Alcohol use-no   Drug use-no                 Risk Factors: Smoking Status: quit (09/17/2010) Passive Smoke Exposure: yes (07/28/2010)  Review of Systems      See HPI  Vital Signs:  Patient profile:   76 year old female Height:      62 inches Weight:      253.50 pounds BMI:     46.53 O2 Sat:      93 % on Room air Temp:     97.6 degrees F oral Pulse rate:   68 / minute BP sitting:   144 / 46  (right arm) Cuff size:   large  Vitals Entered By: Parke Poisson CNA/MA (September 22, 2010 3:55 PM)  O2 Flow:  Room air CC: DOE, prod cough with green/brown mucus, sinus pressure/congestion, PND x5days -  was given abx by PCP at onset of symptoms Is Patient Diabetic? Yes Comments Medications reviewed with patient Daytime contact number verified with patient. Parke Poisson CNA/MA  September 22, 2010 3:54 PM    Physical Exam  Additional Exam:  wt 252 July 04, 2010 >>253 09/22/10  Gen. Pleasant, well-nourished, in no distress, normal affect ENT - max tenderness, red drainage.  Neck: No JVD, no thyromegaly, no carotid bruits Lungs: coarse BS w/ no wheezing  Cardiovascular: Rhythm regular, heart sounds  normal, no murmurs or gallops, 1+ peripheral edema Musculoskeletal: No deformities, no cyanosis or clubbing      Impression & Recommendations:  Problem # 1:  BRONCHITIS, ACUTE (ICD-466.0)  slow to resolve flare w/ sinusitis  xray pending.  Plan:  albuterol neb  Extend Ceftin 500mg   two times a day for additional 5 days Mucinex DM two times a day as needed cough/congestion  Increase fluids and rest.  Saline nasal rinses two times a day as needed  Afrin 2 puffs at bedtime for 5 days Hold fish oil x 2 weeks then restart- but place in freezer  Please contact office for sooner follow up if symptoms do not improve or worsen  follow up Dr. Elsworth Soho in2-3 weeks and as needed  Her updated medication list for this problem includes:    Cefuroxime Axetil 500 Mg Tabs (Cefuroxime axetil) ..... One by mouth bid    Ceftin 500 Mg Tabs (Cefuroxime axetil) .Marland Kitchen... 1 by mouth two times a day  Orders: T-2 View CXR (71020TC) Albuterol Sulfate Sol 1mg  unit dose [id number]) Nebulizer Tx [id number]) Est. Patient Level IV [id number])  Medications Added to Medication List This Visit: 1)  Ceftin 500 Mg Tabs (Cefuroxime axetil) .Marland Kitchen.. 1 by mouth two times a day  Patient Instructions: 1)  Extend Ceftin 500mg   two times a day for additional 5 days 2)  Mucinex DM two times a day as needed cough/congestion  3)  Increase fluids and rest.  4)  Saline nasal rinses two times a day as needed  5)  Afrin 2 puffs at bedtime for 5  days 6)  Hold fish oil x 2 weeks then restart- but place in freezer  7)  Please contact office for sooner follow up if symptoms do not improve or worsen  8)  follow up Dr. Elsworth Soho in2-3 weeks and as needed  Prescriptions: CEFTIN 500 MG TABS (CEFUROXIME AXETIL) 1 by mouth two times a day  #10 x 0   Entered and Authorized by:   Rexene Edison NP   Signed by:   Abriel Hattery NP on 09/22/2010   Method used:   Electronically to        [facility] # [phone]* (retail)       [address]       [place], [place]  [address]       Ph: [phone]       Fax: [phone]   RxID:   [id number]    Medication Administration  Medication # 1:    Medication: Albuterol Sulfate Sol 1mg  unit dose    Diagnosis: BRONCHITIS, ACUTE (ICD-466.0)    Dose: 1 VIAL    Route: inhaled    Exp Date: 09/2011    Lot #: [id number]    Mfr: Nephron    Patient tolerated medication without complications    Given by: Raymondo Band RN (September 22, 2010 4:34 PM)  Orders Added: 1)  T-2 View CXR [71020TC] 2)  Albuterol Sulfate Sol 1mg  unit dose [J7613] 3)  Nebulizer Tx [94640] 4)  Est. Patient Level IV [id number]

## 2010-10-09 NOTE — Assessment & Plan Note (Signed)
Summary: Bronchitis  [name]   Vital Signs:  Patient profile:   76 year old female Weight:      257.50 pounds BMI:     47.27 O2 Sat:      98 % on Room air Temp:     98.0 degrees F oral Pulse rate:   77 / minute Pulse rhythm:   regular Resp:     20 per minute BP sitting:   136 / 80  (right arm) Cuff size:   large  Vitals Entered By: Jiles Garter CMA (September 23, 2009 1:43 PM)  O2 Flow:  Room air  Primary Care Provider:  D. Drema Pry DO  CC:  URI symptoms.  History of Present Illness:  URI Symptoms      This is a 76 year old woman who presents with URI symptoms.  The patient reports clear nasal discharge and productive cough.  her symptoms started with irritated throat.  cough seems to be settling in her chest.  no fever but feels feverish.  no sob.  onset of symptoms - 2 days she denies sinus pain or pressure     Allergies: 1)  ! Sulfa 2)  ! Tetracycline 3)  ! Aspirin 4)  ! * Hormones 5)  ! Advair Diskus (Fluticasone-Salmeterol) 6)  ! Lisinopril 7)  ! * Latex 8)  Glucophage 9)  * Ivp Dye  Past History:  Past Medical History: Current Problems:  HYPOTHYROIDISM (ICD-244.9) HYPERCHOLESTEROLEMIA (ICD-272.0)  HYPERTENSION (ICD-401.9)   SYNCOPE AND COLLAPSE (ICD-780.2)   DIABETES MELLITUS (ICD-250.00) DEPRESSION (ICD-311)  ASTHMA, CHILDHOOD (ICD-493.00)  - no problem since adulthood Allergic rhinitis obesity  panic attacks / anxiety Acute hypoxic respiratory failure secondary to multiple bilateral pulmonary embolism 09/2008 Negative hypercoagulable workup 09/2008 hospital stay Severe obstructive sleep apnea   Past Surgical History: thyroid removed       Family History: Family History of Arthritis Family History Diabetes 1st degree relative Family History High cholesterol Family History Hypertension Family History of  CAD Family History of  stroke Family History of  emotional/mental illness daughters-arthritis, allergies mother-asthma and allergies,  clotting disorder, arthritis mat grandmother- clotting dx, rheumatism mat uncle-clotting dx  daughter-clotting dx, allergies        Social History: 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 Former Smoker Quit smoking 1991.  Smoked off and on x 20 yrs. Alcohol use-no   Drug use-no         Physical Exam  General:  alert and overweight-appearing.   Mouth:  pharyngeal erythema and postnasal drip.   Lungs:  normal respiratory effort, normal breath sounds, and no wheezes.   Heart:  normal rate, regular rhythm, and no gallop.   Extremities:  No lower extremity edema    Impression & Recommendations:  Problem # 1:  URI (ICD-465.9) I suspect symptoms from viral URI.  conservative tx for now .  gargle with warm salt water.  red flag symptoms reviewed.  rx for ceftin to have on hand provided.  pt advised to notify Dr. Marin Olp if she starts abx while on coumadin.  Patient advised to call office if symptoms persist or worsen.  Her updated medication list for this problem includes:    Fexofenadine Hcl 180 Mg Tabs (Fexofenadine hcl) .Marland Kitchen... Take 1 tablet by mouth once a day  Instructed on symptomatic treatment. Call if symptoms persist or worsen.   Complete Medication List: 1)  Synthroid 100 Mcg Tabs (Levothyroxine sodium) .... Take 1 tablet by mouth  once a day 2)  Fexofenadine Hcl 180 Mg Tabs (Fexofenadine hcl) .... Take 1 tablet by mouth once a day 3)  Sertraline Hcl 100 Mg Tabs (Sertraline hcl) .... One by mouth once daily 4)  Actos 30 Mg Tabs (Pioglitazone hcl) .... Take 1 tablet by mouth once a day 5)  Micardis Hct 40-12.5 Mg Tabs (Telmisartan-hctz) .... One by mouth once daily 6)  Zantac 150 Maximum Strength 150 Mg Tabs (Ranitidine hcl) .... One tab by mouth once daily as needed 7)  Multivitamins Tabs (Multiple vitamin) .... One by mouth once daily 8)  Vitamin D 2000 Unit Caps (Cholecalciferol) .... Take 1 capsule by mouth once a day 9)  Fish Oil 1000  Mg Caps (Omega-3 fatty acids) .... Take 2  tablet by mouth two times a day 10)  Crestor 20 Mg Tabs (Rosuvastatin calcium) .... Take 1 tablet by mouth once a day 11)  Auto Cpap  .... 5-15 12)  Coumadin 5 Mg Tabs (Warfarin sodium) .... As directed 13)  Januvia 100 Mg Tabs (Sitagliptin phosphate) .... Take 1 tablet by mouth once a day 14)  Co Q-10 200 Mg Caps (Coenzyme q10) .... Take 1 tablet by mouth once a day 15)  Glimepiride 2 Mg Tabs (Glimepiride) .... Take 1/2  tablet by mouth once a day 16)  Cefuroxime Axetil 500 Mg Tabs (Cefuroxime axetil) .... One by mouth two times a day  Patient Instructions: 1)  If you use antibiotic, please notify Dr. Marin Olp to have your INR checked within 1 week.  2)  Call our office if your symptoms do not  improve or gets worse. Prescriptions: CEFUROXIME AXETIL 500 MG TABS (CEFUROXIME AXETIL) one by mouth two times a day  #14 x 0   Entered and Authorized by:   D. Drema Pry DO   Signed by:   D. Drema Pry DO on 09/23/2009   Method used:   Print then Give to Patient   RxID:   [id number]   Current Allergies (reviewed today): ! SULFA ! TETRACYCLINE ! ASPIRIN ! * HORMONES ! ADVAIR DISKUS (FLUTICASONE-SALMETEROL) ! LISINOPRIL ! * LATEX GLUCOPHAGE * IVP DYE

## 2010-10-09 NOTE — Progress Notes (Signed)
Summary: Sertraline  Phone Note Refill Request Message from:  Fax from Pharmacy on May 07, 2009 1:09 PM  Refills Requested: Medication #1:  SERTRALINE HCL 100 MG TABS one by mouth once daily   Dosage confirmed as above?Dosage Confirmed   Brand Name Necessary? No   Supply Requested: 1 month   Last Refilled: 03/31/2009  Method Requested: Electronic Next Appointment Scheduled: 05/21/2009 Initial call taken by: Jiles Garter CMA,  May 07, 2009 1:09 PM    Prescriptions: SERTRALINE HCL 100 MG TABS (SERTRALINE HCL) one by mouth once daily  #30 x 0   Entered by:   Jiles Garter CMA   Authorized by:   D. Drema Pry DO   Signed by:   Jiles Garter CMA on 05/07/2009   Method used:   Electronically to        [facility] [address] # [phone]* (retail)       [address]       [place], [place]  [address]       Ph: [phone]       Fax: [phone]   [place]:   [phone]

## 2010-10-09 NOTE — Assessment & Plan Note (Signed)
Summary: slc per PW/LC   Visit Type:  Initial Consult Copy to:  Dr. Asencion Noble Primary Provider/Referring Provider:  Orinda Kenner MD  CC:  OSA.  History of Present Illness: [name] with BL PE 1/10 referred for evaluation of obstructive sleep apnea. Epworth Sleepiness Score 8/24. DIagnostic PSG in 8/09 showed severe obstructive sleep apnea with AHI 85/h, TST 121 mins - no REM sleep. Subsequent titration study showed CPAP requirement of 15 cm with small comfort gel mask Arville Go). However she felt that this pressure was too high & was not very compliant with CPAP. After PE, oximetry showed desaturation to 77% & oxygen 2 L [place] was started  [facility]).  Bedtime is 10-12MN, sleep latency variable, 2-3 awakenings, wakes up at 8A feeling tired, no headaches. She has gained 30 lbs in the last 2 yrs.     What time do you typically go to bed?(between what hours): 10 to 12 pm  How long does it take you to fall asleep? varies from 15 min to 2 hours  How many times during the night do you wake up? 1 to 2  What time do you get out of bed to start your day? 8 to 9 am  Do you drive or operate heavy machinery in your occupation? no  How much has your weight changed (up or down) over the past two years? (in pounds): gained 30 to 40 lbs  Have you ever had a sleep study before?  If yes,when and where: yes at [facility]. on 04-10-08  Do you currently use CPAP ? If so , at what pressure? 15 "feels to strong"  Do you wear oxygen at any time? If yes, how many liters per minute? 2 liters, at night only Current Medications (verified): 1)  Synthroid 100 Mcg Tabs (Levothyroxine Sodium) .... Take 1 Tablet By Mouth Once A Day 2)  Fexofenadine Hcl 180 Mg Tabs (Fexofenadine Hcl) .... Take 1 Tablet By Mouth Once A Day 3)  Sertraline Hcl 100 Mg Tabs (Sertraline Hcl) .... One By Mouth Once Daily 4)  Actos 45 Mg Tabs (Pioglitazone Hcl) .... Take 1/2 Tablet By Mouth Once A Day 5)  Januvia 100 Mg Tabs  (Sitagliptin Phosphate) .... One By Mouth Once Daily 6)  Rivaroxaban - For Pulmonary Embolism Tx .... Patient Is in A Phase Iii Study With Dr. Asencion Noble 20mg  By Mouth Daily 7)  Spironolactone 25 Mg Tabs (Spironolactone) .... One Tab By Mouth Once Daily 8)  Zantac 150 Maximum Strength 150 Mg Tabs (Ranitidine Hcl) .... One Tab By Mouth Once Daily As Needed 9)  Multivitamins  Tabs (Multiple Vitamin) .... One By Mouth Once Daily  Allergies (verified): 1)  ! Glucophage 2)  ! Sulfa 3)  ! Tetracycline 4)  ! Aspirin 5)  ! * Hormones 6)  ! Advair Diskus (Fluticasone-Salmeterol) 7)  ! Lisinopril 8)  ! Crestor (Rosuvastatin Calcium) 9)  ! * Latex  Past History:  Past Medical History:    Current Problems:     HYPOTHYROIDISM (ICD-244.9)    HYPERCHOLESTEROLEMIA (ICD-272.0)    HYPERTENSION (ICD-401.9)    SYNCOPE AND COLLAPSE (ICD-780.2)    DIABETES MELLITUS (ICD-250.00)    DEPRESSION (ICD-311)    ASTHMA, CHILDHOOD (ICD-493.00)  - no problem since adulthood    Allergic rhinitis    obesity    panic attacks / anxiety    Acute hypoxic respiratory failure secondary to multiple bilateral pulmonary embolism 09/2008    Negative hypercoagulable workup 09/2008 hospital stay  sleep apnea     (10/26/2008)  Family History:    Family History of Arthritis    Family History Diabetes 1st degree relative    Family History High cholesterol    Family History Hypertension    Family History of  CAD    Family History of  stroke    Family History of  emotional/mental illness    daughters-arthritis, allergies    mother-asthma and allergies, clotting disorder, arthritis    mat grandmother- clotting dx, rheumatism    mat uncle-clotting dx    daughter-clotting dx, allergies (11/15/2008)  Social History:    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    Former Smoker Quit smoking 1991.  Smoked off and on x 20 yrs.    Alcohol use-no    Drug use-no      (11/15/2008)  Risk  Factors:    Smoking Status: quit > 6 months (11/15/2008)    Packs/Day: N/A    Cigars/wk: N/A    Pipe Use/wk: N/A    Cans of tobacco/wk: N/A    Passive Smoke Exposure: yes (11/08/2008)  Review of Systems       The patient complains of shortness of breath with activity, chest pain, headaches, nasal congestion/difficulty breathing through nose, anxiety, depression, hand/feet swelling, and joint stiffness or pain.  The patient denies shortness of breath at rest, productive cough, non-productive cough, coughing up blood, irregular heartbeats, acid heartburn, indigestion, loss of appetite, weight change, abdominal pain, difficulty swallowing, sore throat, tooth/dental problems, sneezing, itching, ear ache, rash, change in color of mucus, and fever.    Vital Signs:  Patient profile:   76 year old female Weight:      262 pounds BMI:     46.58 Temp:     98.5 degrees F oral Pulse rate:   73 / minute BP sitting:   110 / 58  (left arm)  Vitals Entered By: Tilden Dome (December 17, 2008 2:18 PM)  O2 Sat on room air at rest %:  96  Physical Exam  Additional Exam:  Gen. Pleasant, well-nourished, in no distress, normal affect ENT - no lesions, no post nasal drip Neck: No JVD, no thyromegaly, no carotid bruits Lungs: no use of accessory muscles, no dullness to percussion, clear without rales or rhonchi  Cardiovascular: Rhythm regular, heart sounds  normal, no murmurs or gallops, no peripheral edema Abdomen: soft and non-tender, no hepatosplenomegaly, BS normal. Musculoskeletal: No deformities, no cyanosis or clubbing Neuro:  alert, non focal     Impression & Recommendations:  Problem # 1:  SLEEP APNEA (ICD-780.57) Assessment New -severe based on PSG in 8/09. She is unable to tolerate higher pressure. autoCPAP 5-15 with 3 L O2 blended in,check download & oximetry data in 4 weeks  If 90% pressure is inded as high as 15 then we may have to leave her on automode.  Problem # 2:  HYPOXEMIA  (ICD-799.02) Desaturation (without CPAP) could be on the basis of PE or obstructive sleep apnea . Suggest we repeat oximetry on CPAP/ RA - if desaturations persist, this can be attributed to PE & she will need to continue O2.  Other Orders: Est. Patient Level IV [id number]) DME Referral (DME) DME Referral (DME)  Patient Instructions: 1)  Please schedule a follow-up appointment in 2 months. 2)  we will get you an autoCPAP machine & blend oxygen into this

## 2010-10-09 NOTE — Procedures (Signed)
Summary: Oximetry Report/[facility]  Oximetry Report/[facility]   Imported By: Edmonia James 12/03/2008 11:02:02  _____________________________________________________________________  External Attachment:    Type:   Image     Comment:   External Document

## 2010-10-09 NOTE — Letter (Signed)
Summary: [place]   Imported By: Edmonia James 03/13/2010 12:13:49  _____________________________________________________________________  External Attachment:    Type:   Image     Comment:   External Document

## 2010-10-09 NOTE — Procedures (Signed)
Summary: O2 Sats/[facility]  O2 Sats/[facility]   Imported By: Edmonia James 07/14/2010 12:22:09  _____________________________________________________________________  External Attachment:    Type:   Image     Comment:   External Document

## 2010-10-09 NOTE — Assessment & Plan Note (Signed)
Summary: 1 month follow up/mhf rsch per pt/dt HELEN LEFT MESSAGE LEFT ...   Vital Signs:  Patient profile:   76 year old female Height:      62 inches Weight:      254.75 pounds BMI:     46.76 O2 Sat:      95 % on Room air Temp:     98.3 degrees F oral Pulse rate:   76 / minute Pulse rhythm:   regular Resp:     18 per minute BP sitting:   140 / 60  (left arm) Cuff size:   large  Vitals Entered By: Jiles Garter CMA (May 09, 2010 3:38 PM)  O2 Flow:  Room air CC: 1 Month Follow up  Is Patient Diabetic? Yes Did you bring your meter with you today? No Pain Assessment Patient in pain? no      Comments stop checking blood sugars, this morning 354, increase in appetite   Primary Care Provider:  Jennings Books DO  CC:  1 Month Follow up .  History of Present Illness: 76 y/o white female for DM II f/u dietary compliance is sporadic ate Lasagna 2 nights ago - sugars were >300 she has difficulty controlling her appetite   Preventive Screening-Counseling & Management  Alcohol-Tobacco     Smoking Status: quit  Allergies: 1)  ! Sulfa 2)  ! Tetracycline 3)  ! Aspirin 4)  ! * Hormones 5)  ! Advair Diskus (Fluticasone-Salmeterol) 6)  ! Lisinopril 7)  ! * Latex 8)  Glucophage 9)  * Ivp Dye  Past History:  Past Medical History: Current Problems:  HYPOTHYROIDISM (ICD-244.9)  HYPERCHOLESTEROLEMIA (ICD-272.0)   HYPERTENSION (ICD-401.9)   SYNCOPE AND COLLAPSE (ICD-780.2)     DIABETES MELLITUS (ICD-250.00) DEPRESSION (ICD-311)  ASTHMA, CHILDHOOD (ICD-493.00)  - no problem since adulthood Allergic rhinitis obesity  panic attacks / anxiety Acute hypoxic respiratory failure secondary to multiple bilateral pulmonary embolism 09/2008 Negative hypercoagulable workup 09/2008 hospital stay Severe obstructive sleep apnea    Past Surgical History: thyroid surgery   Social History: 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 Former  Smoker Quit smoking 1991.  Smoked off and on x 20 yrs. Alcohol use-no   Drug use-no             Physical Exam  General:  alert and overweight-appearing.   Lungs:  normal respiratory effort and normal breath sounds.   Heart:  normal rate, regular rhythm, and no gallop.   Extremities:  trace left pedal edema and trace right pedal edema.     Impression & Recommendations:  Problem # 1:  DIABETES MELLITUS (ICD-250.00) Assessment Deteriorated poor control.  she has difficulty controlling her appetite.  trial of victoza  The following medications were removed from the medication list:    Actos 15 Mg Tabs (Pioglitazone hcl) ..... One by mouth once daily    Januvia 100 Mg Tabs (Sitagliptin phosphate) .Marland Kitchen... Take 1 tablet by mouth once a day Her updated medication list for this problem includes:    Micardis Hct 40-12.5 Mg Tabs (Telmisartan-hctz) ..... One by mouth once daily    Nateglinide 60 Mg Tabs (Nateglinide) ..... One by mouth three times a day before meals    Actos 15 Mg Tabs (Pioglitazone hcl) .Marland Kitchen... Take 1 tablet by mouth once a day    Januvia 100 Mg Tabs (Sitagliptin phosphate) .Marland Kitchen... Take 1 tablet by mouth once a day  Complete Medication List: 1)  Synthroid  100 Mcg Tabs (Levothyroxine sodium) .... Take 1 tablet by mouth once a day 2)  Fexofenadine Hcl 180 Mg Tabs (Fexofenadine hcl) .... Take 1 tablet by mouth once a day 3)  Sertraline Hcl 50 Mg Tabs (Sertraline hcl) .... Take 1 tablet by mouth once a day 4)  Micardis Hct 40-12.5 Mg Tabs (Telmisartan-hctz) .... One by mouth once daily 5)  Zantac 150 Maximum Strength 150 Mg Tabs (Ranitidine hcl) .... One tab by mouth once daily as needed 6)  Multivitamins Tabs (Multiple vitamin) .... One by mouth once daily 7)  Vitamin D 2000 Unit Caps (Cholecalciferol) .... Take 1 capsule by mouth once a day 8)  Fish Oil 1000 Mg Caps (Omega-3 fatty acids) .... Take 2  tablet by mouth two times a day 9)  Crestor 20 Mg Tabs (Rosuvastatin calcium) ....  Take 1 tablet by mouth once a day 10)  Auto Cpap  .... 5-15 11)  Coumadin 5 Mg Tabs (Warfarin sodium) .... As directed 12)  Co Q-10 200 Mg Caps (Coenzyme q10) .... Take 1 tablet by mouth once a day 13)  Nateglinide 60 Mg Tabs (Nateglinide) .... One by mouth three times a day before meals 14)  Furosemide 20 Mg Tabs (Furosemide) .... One by mouth qam 15)  Pen Needles 1/2" 29g X 64mm Misc (Insulin pen needle) .... Use once daily as directed 16)  Actos 15 Mg Tabs (Pioglitazone hcl) .... Take 1 tablet by mouth once a day 17)  Januvia 100 Mg Tabs (Sitagliptin phosphate) .... Take 1 tablet by mouth once a day  Patient Instructions: 1)  Please schedule a follow-up appointment in 1 month. 2)  Stop Actos 3)  Stop Januvia Prescriptions: PEN NEEDLES 1/2" 29G X 12MM MISC (INSULIN PEN NEEDLE) use once daily as directed  #100 x 1   Entered and Authorized by:   D. Drema Pry DO   Signed by:   D. Drema Pry DO on 05/09/2010   Method used:   Electronically to        [facility] # [phone]* (retail)       [address]       [place], [place]  [address]       Ph: [phone]       Fax: [phone]   RxID:   [id number] 18 MG/3ML SOLN (LIRAGLUTIDE) use 1.2 mg Arbutus once daily  #1 x 3   Entered and Authorized by:   D. Drema Pry DO   Signed by:   D. Drema Pry DO on 05/09/2010   Method used:   Electronically to        [facility] # [phone]* (retail)       [address]       [place], [place]  [address]       Ph: [phone]       Fax: [phone]   RxID:   [id number]   Current Allergies (reviewed today): ! SULFA ! TETRACYCLINE ! ASPIRIN ! * HORMONES ! ADVAIR DISKUS (FLUTICASONE-SALMETEROL) ! LISINOPRIL ! * LATEX GLUCOPHAGE * IVP DYE

## 2010-10-09 NOTE — Progress Notes (Signed)
Summary: cpap mask  Phone Note Call from Patient Call back at Home Phone [phone]   Caller: Patient Call For: Shreya Lacasse Reason for Call: Talk to Nurse Summary of Call: Returning [place] R.'s call. Initial call taken by: Mateo Flow,  August 14, 2010 11:42 AM  Follow-up for Phone Call        Pt aware of RA recs for download and she states that the mask is old and she has a hard time adjusting it to prevent the leaks. She is going to call [facility] to see if she can get another full face mask and if she has any problems will call us back. Iran Planas CMA  August 15, 2010 9:02 AM

## 2010-10-09 NOTE — Assessment & Plan Note (Signed)
Summary: 2 months/ mbw   Visit Type:  Follow-up Copy to:  Dr. Asencion Noble Primary Provider/Referring Provider:  D. Drema Pry DO  CC:  Pt here for 3 month follow up. Pt states uses CPAP machine between 4 to 7 hours when she can.  History of Present Illness: [name] with BL PE 1/10 presents for FU of severe obstructive sleep apnea.  DIagnostic PSG in 8/09 showed severe obstructive sleep apnea with AHI 85/h, TST 121 mins - no REM sleep. Subsequent titration study showed CPAP requirement of 15 cm with small comfort gel mask Arville Go). However she felt that this pressure was too high & was not very compliant with CPAP. After PE, oximetry showed desaturation to 77% & oxygen 2 L [place] was started  [facility]).  Bedtime is 10-12MN, sleep latency variable, 2-3 awakenings, wakes up at 8A feeling tired, no headaches. She has gained 30 lbs in the last 2 yrs.  3/10>> autoCPAP 5-15 with 3 L O2 blended in,check download & oximetry data in 4 weeks  If 90% pressure is indeed as high as 15 then we may have to leave her on automode. Desaturation (without CPAP) could be on the basis of PE or obstructive sleep apnea .  6/29 >> Good results with CPAP, feels refreshed in am, no excessive daytime somnolence, mask OK, pressure ok , loves auto- mode. Have been unable to obtain download or oximetry. CPAP is with Gentiva (HCS) & O2 with [facility]. No desaturation with exertion today. PE occurred while on Pro-gon B (hormone supplement, sub lingual)   Current Medications (verified): 1)  Synthroid 100 Mcg Tabs (Levothyroxine Sodium) .... Take 1 Tablet By Mouth Once A Day 2)  Fexofenadine Hcl 180 Mg Tabs (Fexofenadine Hcl) .... Take 1 Tablet By Mouth Once A Day 3)  Sertraline Hcl 100 Mg Tabs (Sertraline Hcl) .... One By Mouth Once Daily 4)  Actos 45 Mg Tabs (Pioglitazone Hcl) .... Take 1/2 Tablet By Mouth Once A Day 5)  Janumet 50-500 Mg Tabs (Sitagliptin-Metformin Hcl) .... Take 1 Tablet By Mouth Two Times A Day 6)  Rivaroxaban -  For Pulmonary Embolism Tx .... Patient Is in A Phase Iii Study With Dr. Asencion Noble 20mg  By Mouth Daily 7)  Micardis Hct 40-12.5 Mg Tabs (Telmisartan-Hctz) .... One By Mouth Once Daily 8)  Zantac 150 Maximum Strength 150 Mg Tabs (Ranitidine Hcl) .... One Tab By Mouth Once Daily As Needed 9)  Multivitamins  Tabs (Multiple Vitamin) .... One By Mouth Once Daily 10)  Vitamin D 50000 Unit Caps (Ergocalciferol) .... One By Mouth Q Wkly 11)  Fish Oil 1000 Mg Caps (Omega-3 Fatty Acids) .... Take 2  Tablet By Mouth Two Times A Day 12)  Lipitor 40 Mg Tabs (Atorvastatin Calcium) .... One By Mouth Qd 13)  Cpap .Marland Kitchen.. 13  Allergies (verified): 1)  ! Sulfa 2)  ! Tetracycline 3)  ! Aspirin 4)  ! * Hormones 5)  ! Advair Diskus (Fluticasone-Salmeterol) 6)  ! Lisinopril 7)  ! Crestor (Rosuvastatin Calcium) 8)  ! 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 Former Smoker Quit smoking 1991.  Smoked off and on x 20 yrs. Alcohol use-no Drug use-no     Review of Systems       The patient complains of dyspnea on exertion.  The patient denies anorexia, fever, weight loss, weight gain, vision loss, decreased hearing, hoarseness, chest pain, syncope, peripheral edema, prolonged cough, headaches, hemoptysis, abdominal pain, melena, hematochezia, severe indigestion/heartburn, hematuria,  incontinence, genital sores, muscle weakness, suspicious skin lesions, transient blindness, difficulty walking, depression, unusual weight change, abnormal bleeding, enlarged lymph nodes, and angioedema.    Vital Signs:  Patient profile:   76 year old female Height:      63 inches Weight:      248.38 pounds O2 Sat:      96 % on Room air Temp:     98.2 degrees F oral Pulse rate:   66 / minute BP sitting:   128 / 62  (right arm) Cuff size:   thigh  Vitals Entered By: Iran Planas CMA (March 05, 2009 10:40 AM)  O2 Flow:  Room air  Serial Vital Signs/Assessments:  Comments: Ambulatory Pulse Oximetry  Resting; HR__65___    02 Sat__95___  Lap1 (185 feet)   HR__91___   02 Sat__94___ Lap2 (185 feet)   HR__97___   02 Sat__92___    Lap3 (185 feet)   HR__97___   02 Sat__94___  _x__Test Completed without Difficulty ___Test Stopped due to:  Iran Planas CMA  March 05, 2009 11:39 AM    By: Iran Planas CMA   CC: Pt here for 3 month follow up. Pt states uses CPAP machine between 4 to 7 hours when she can Comments Medications reviewed with patient Iran Planas CMA  March 05, 2009 10:47 AM    Physical Exam  Additional Exam:  Gen. Pleasant, well-nourished, in no distress, normal affect ENT - no lesions, no post nasal drip Neck: No JVD, no thyromegaly, no carotid bruits Lungs: no use of accessory muscles, no dullness to percussion, clear without rales or rhonchi  Cardiovascular: Rhythm regular, heart sounds  normal, no murmurs or gallops, 2+ peripheral edema Abdomen: soft and non-tender, no hepatosplenomegaly, BS normal. Musculoskeletal: No deformities, no cyanosis or clubbing Neuro:  alert, non focal     Impression & Recommendations:  Problem # 1:  SLEEP APNEA (ICD-780.57) ct auto CPAP 5-15 cm. Check download, for residual events - if higher pressure reqd , will leave on auto mode - otherwise change to fixed level CPAP. Orders: Est. Patient Level III [id number])  DME Referral (DME)  Problem # 2:  PULMONARY EMBOLISM (ICD-415.19) Check noc oximetry. If no desaturations, dc O2. Suggest we repeat oximetry on CPAP/ RA - if desaturations persist, this can be attributed to PE  Orders: Est. Patient Level III [id number]) DME Referral (DME)  Medications Added to Medication List This Visit: 1)  Fish Oil 1000 Mg Caps (Omega-3 fatty acids) .... Take 2  tablet by mouth two times a day 2)  Cpap  .Marland Kitchen.. 13  Patient Instructions: 1)  Please schedule a follow-up appointment in 3 months. 2)  Ambulatory saturation 3)  I will make changes t your CPAP & oxygen after checking download 4)  Please meet our co-ordinator before you leave today 5)  Dr Joya Gaskins to decide about duration of blood thinner  Appended Document: 2 months/ mbw Reviewed ONO >> desatn less than 88% for 35 mins, sustained during ? REM sleep, ct O2  with CPAP during sleep  Appended  Document: 2 months/ mbw Called pt's home # and same was busy, called back and was fax machine. JWR  Appended Document: 2 months/ mbw pt informed. pt stated a red light is on O2 machine and not working properly. I advised pt to call company to either get fixed or replaced. Pt stated she has done same and was advised to wait and see it she will still need to use same, pt will call company again. JWR

## 2010-10-09 NOTE — Progress Notes (Signed)
Summary: Phone note  Phone Note Call from Patient   Caller: Patient Summary of Call: Patient says she is broke out all over her body with whelps after having a CT. Dr. Joya Gaskins wrote her a prescription for hydroxyzine HCL 25mg  but it makes her sleepy.  Patient wants to know if there is something else she can do or take for the whelps? Patient can be reached at [phone]. Initial call taken by: Marcelino Freestone RMA,  November 01, 2008 1:42 PM  Follow-up for Phone Call        spoke to patient.  Unclear if reaction was from IV contrast - or patient says she drank barium and has had trouble in the past with perservaitives / additives?    no shortness of breath, no lip/tongue/throat swelling.  she can try benadryl - may make her just as sleepy - cannot use both.  can try over the counter Aveeno Oatmeal bath.  reviewed other natural treatment.    Reviewed "red flag" symptoms that would require immediate medical attention.  Patient to call if symptoms increase / change / persist or any concerns.   Follow-up by: Orinda Kenner MD,  November 01, 2008 4:58 PM  Additional Follow-up for Phone Call Additional follow up Details #1::        Rash is nearly gone.  Doing much better.   Additional Follow-up by: Orinda Kenner MD,  November 02, 2008 4:56 PM

## 2010-10-09 NOTE — Progress Notes (Signed)
Summary: Physician statement form   Patient requesting physician to answer questions from Insurance claim. Instructed the patient to fax form to [employer]'s attention at [phone] for completion. Please allow 7-10 business after request has been received. Miss Vanness will be faxing sometime day after she goes to her office. Rollene Fare Flowers  March 18, 2009 12:50 PM

## 2010-10-09 NOTE — Miscellaneous (Signed)
Summary: Overnight Sleep Oximetry  Clinical Lists Changes  Observations: Added new observation of SLEEP STUDY: Low Oxygen Sat:  Oximetry overnight on           RA       =    77%  (11/20/2008 2:50)      Sleep Study  Procedure date:  11/20/2008  Findings:      Low Oxygen Sat:  Oximetry overnight on           RA       =    77%

## 2010-10-09 NOTE — Letter (Signed)
Summary: CMN for Oxygen/[facility]  CMN for Oxygen/[facility]   Imported By: Edmonia James 01/22/2010 09:09:25  _____________________________________________________________________  External Attachment:    Type:   Image     Comment:   External Document

## 2010-10-09 NOTE — Letter (Signed)
Summary: [place]   Imported By: Laural Benes 08/05/2010 11:00:34  _____________________________________________________________________  External Attachment:    Type:   Image     Comment:   External Document

## 2010-10-09 NOTE — Letter (Signed)
Summary: CMN for [facility]  CMN for [facility]   Imported By: Edmonia James 07/25/2010 11:29:07  _____________________________________________________________________  External Attachment:    Type:   Image     Comment:   External Document

## 2010-10-09 NOTE — Letter (Signed)
Summary: [facility]  [facility]   Imported By: Phillis Knack 09/13/2009 09:12:17  _____________________________________________________________________  External Attachment:    Type:   Image     Comment:   External Document

## 2010-10-09 NOTE — Assessment & Plan Note (Signed)
Summary: new to be est., S.O.B., thyroid   Vital Signs:  Patient Profile:   76 Years Old Female Height:     63 inches Weight:      240 pounds BMI:     42.67 O2 Sat:      69 % O2 treatment:    Oxygen Temp:     97.6 degrees F oral Pulse rate:   94 / minute Pulse rhythm:   regular BP sitting:   122 / 60  (right arm) Cuff size:   large  Vitals Entered By: Marcelino Freestone RMA (September 27, 2008 11:06 AM)                 Serial Vital Signs/Assessments:                                PEF    PreRx  PostRx Time      O2 Sat  O2 Type     L/min  L/min  L/min   By           78  %   4 L/min                           Orinda Kenner MD           86  %   6 L/min                           Orinda Kenner MD   Visit Type:  New patient - acute PCP:  Orinda Kenner MD  Chief Complaint:  Shortness of breath.  History of Present Illness: New patient with acute onset of shortness of breath and cough which started yesterday and has been progressive with associated weakness.  She denies chest pain.  Symptoms are worse with exertion.  Patient arrived and appeared in respiratory distress.  Pulse Ox was obtained which showed 69% on room air.  no history of oxygen use at home or current diagnosis of lung disease per patient and family - daughter and grandson present.  She has a past history of Asthma as a child - but no problems as an adult.    Updated Prior Medication List: SYNTHROID 100 MCG TABS (LEVOTHYROXINE SODIUM) Take 1 tablet by mouth once a day FEXOFENADINE HCL 180 MG TABS (FEXOFENADINE HCL) Take 1 tablet by mouth once a day SERTRALINE HCL 50 MG TABS (SERTRALINE HCL) Take 1 tablet by mouth once a day AVAPRO 150 MG TABS (IRBESARTAN) 1/2 tablet at night ACTOS 45 MG TABS (PIOGLITAZONE HCL) Take 1 tablet by mouth once a day] ARMOUR THYROID 60 MG TABS (THYROID) one tab by mouth once daily  Current Allergies: ! GLUCOPHAGE ! SULFA ! TETRACYCLINE ! ASPIRIN ! * LATEX  Past Medical  History:    Current Problems:     HYPOTHYROIDISM (ICD-244.9)    HYPERCHOLESTEROLEMIA (ICD-272.0)    HYPERTENSION (ICD-401.9)    SYNCOPE AND COLLAPSE (ICD-780.2)    DIABETES MELLITUS (ICD-250.00)    DEPRESSION (ICD-311)    ASTHMA, CHILDHOOD (ICD-493.00)  - no problem since adulthood    Allergic rhinitis    obesity    panic attacks  Past Surgical History:    thyroid removed   Family History:    Family History of Arthritis    Family History Diabetes 1st degree relative    Family History High cholesterol  Family History Hypertension    Family History of  CAD    Family History of  stroke    Family History of  emotional/mental illness  Social History:    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 unable to do full ROS because patient was in significant respiratory distress and was transported downstairs to the ER ASAP   Physical Exam  General:     Patient is in obvious respiratory distress, able to talk - but O2 desats significantly when she does, she is pale and weak appearing.  patient in a wheelchair, but daughter states she is not usually in a wheelchair - just feeling very weak Head:     normocephalic, atraumatic, and no abnormalities observed.   Eyes:     conjunctiva without injection or icterus Ears:     no external deformity noted Nose:     no external deformity noted Neck:     supple and full ROM.   Lungs:     decreased air movement bilaterally, but no wheezes / rhonchi or rales noted Heart:     Normal rate and regular rhythm. S1 and S2 normal without gallop, murmur, click, rub or  other extra sounds. Extremities:     no edema Skin:     pale, no suspicious lesions or rashes noted Psych:     alert, oriented and interactive, good eye contact    Impression & Recommendations:  Problem # 1:  RESPIRATORY DISTRESS (ICD-786.09) Patient was transported in a wheelchair to the ER accompanied by Dr. Redmond Pulling and Marcelino Freestone, RMA.  The ER team met [place] and took over her care.  I gave report to the MD on duty  Update:  ER MD called to let me know she had multiple pulmonary emboli and elevated troponin.  She is currently stable and being admitted.  Problem # 2:  SHORTNESS OF BREATH (ICD-786.05) Patient was transported in a wheelchair to the ER accompanied by Dr. Redmond Pulling and Marcelino Freestone, RMA.  The ER team met [place] and took over her care.  I gave report to the MD on duty  Problem # 3:  HYPERTENSION (ICD-401.9) BP today in good control Her updated medication list for this problem includes:    Avapro 150 Mg Tabs (Irbesartan) .Marland Kitchen... 1/2 tablet at night   Problem # 4:  DIABETES MELLITUS (ICD-250.00) Unknown status at this point. Her updated medication list for this problem includes:    Avapro 150 Mg Tabs (Irbesartan) .Marland Kitchen... 1/2 tablet at night    Actos 45 Mg Tabs (Pioglitazone hcl) .Marland Kitchen... Take 1 tablet by mouth once a day]   Problem # 5:  HYPERCHOLESTEROLEMIA (ICD-272.0) patient not on medication.  will need to review with patient at next appointment.  Problem # 6:  HYPOTHYROIDISM (ICD-244.9) patient appears to be on 2 different medications, will need to clarify at next appointment. Her updated medication list for this problem includes:  Synthroid 100 Mcg Tabs (Levothyroxine sodium) .Marland Kitchen... Take 1 tablet by mouth once a day    Armour Thyroid 60 Mg Tabs (Thyroid) ..... One tab by mouth once daily   Problem # 7:  DEPRESSION (ICD-311) patient on medication.  will need to review at follow up appointment. Her updated medication list for this problem includes:    Sertraline Hcl 50  Mg Tabs (Sertraline hcl) .Marland Kitchen... Take 1 tablet by mouth once a day   Problem # 8:  ALLERGIC RHINITIS (ICD-477.9) patient on medication.  will need to review at follow up appointment. Her updated medication list for this problem includes:    Fexofenadine Hcl 180 Mg Tabs (Fexofenadine hcl) .Marland Kitchen... Take 1 tablet by mouth once a day   Complete Medication List: 1)  Synthroid 100 Mcg Tabs (Levothyroxine sodium) .... Take 1 tablet by mouth once a day 2)  Fexofenadine Hcl 180 Mg Tabs (Fexofenadine hcl) .... Take 1 tablet by mouth once a day 3)  Sertraline Hcl 50 Mg Tabs (Sertraline hcl) .... Take 1 tablet by mouth once a day 4)  Avapro 150 Mg Tabs (Irbesartan) .... 1/2 tablet at night 5)  Actos 45 Mg Tabs (Pioglitazone hcl) .... Take 1 tablet by mouth once a day] 6)  Armour Thyroid 60 Mg Tabs (Thyroid) .... One tab by mouth once daily

## 2010-10-09 NOTE — Progress Notes (Signed)
  Phone Note Other Incoming   Summary of Call: Pt requests referral to endo for DM II mgt.   I suggest Dr. Suzette Battiest.  See orders Initial call taken by: D. Drema Pry DO,  April 10, 2009 4:29 PM  Follow-up for Phone Call        Appt  Dr Chalmers Cater   Sept 20th  Pt notified   Follow-up by: Jiles Garter,  April 23, 2009 9:45 AM

## 2010-10-09 NOTE — Progress Notes (Signed)
Summary: Status Update  Phone Note Call from Patient Call back at Home Phone [phone]   Caller: Patient Summary of Call: patient called and left voice message stating Dr Shawna Orleans asked her to give him a call when she started the antibiotics. Her message states that she is not any better, her symptoms have worsened since her office visit,so she has started the antibiotics today. Initial call taken by: Jiles Garter CMA,  September 27, 2009 2:38 PM  Follow-up for Phone Call        noted Follow-up by: D. Drema Pry DO,  September 27, 2009 4:38 PM

## 2010-10-09 NOTE — Progress Notes (Signed)
Summary: Victoza & Nausea  Phone Note Call from Patient Call back at Home Phone [phone]   Caller: Patient Call For: D. Drema Pry DO Summary of Call: patient called and left voice message stating she believes the Victoza is making her sick. She states for the past 3 days she has had nausea, and after reading the  insert she believes that her thyroid maybe compromised. Her message states that she does not not feel comfortable to continue with the medication, but she will take the medication if Dr Shawna Orleans feels that she should. Please advise Initial call taken by: Jiles Garter CMA,  May 20, 2010 9:05 AM  Follow-up for Phone Call        [facility] to stop medication she should then resume prev rx for Januvia and Actos.  please call in [place] and actos (prev dose) Follow-up by: D. Drema Pry DO,  May 20, 2010 9:20 AM  Additional Follow-up for Phone Call Additional follow up Details #1::        call placed to patient at [phone], patient has been advised per Dr Shawna Orleans instructions. She states she still has a 90 day supply for the Januvia and the Actos, and did not need a rx at present Additional Follow-up by: Jiles Garter CMA,  May 21, 2010 8:58 AM    New/Updated Medications: ACTOS 15 MG TABS (PIOGLITAZONE HCL) Take 1 tablet by mouth once a day JANUVIA 100 MG TABS (SITAGLIPTIN PHOSPHATE) Take 1 tablet by mouth once a day

## 2010-10-09 NOTE — Miscellaneous (Signed)
Summary: Care Plan/[facility]  Care Plan/[facility]   Imported By: Edmonia James 08/14/2010 16:17:25  _____________________________________________________________________  External Attachment:    Type:   Image     Comment:   External Document

## 2010-10-09 NOTE — Miscellaneous (Signed)
Summary: ORDER Oximetry/IDS  ORDER Oximetry/IDS   Imported By: Bubba Hales 12/28/2008 09:47:23  _____________________________________________________________________  External Attachment:    Type:   Image     Comment:   External Document

## 2010-10-09 NOTE — Progress Notes (Signed)
Summary: massage  Phone Note Call from Patient Call back at Home Phone [phone]   Caller: Patient Call For: Jyquan Kenley Reason for Call: Talk to Nurse Summary of Call: Has had Multiple bloodclots in her lungs, was told no massages(back in January)  Has it been long enough that she could have a massage now.  She has been having muscle spasms from rear to knee.  She wants to know if she can have a massage.  pt is also in one of [employer]'s study programs. Initial call taken by: Zigmund Gottron,  January 01, 2009 10:16 AM  Follow-up for Phone Call        Please advise if OK for pt to have a massage for muscle spasms. Follow-up by: Freddrick March RN,  January 01, 2009 10:21 AM  Additional Follow-up for Phone Call Additional follow up Details #1::        ok for massage now  Additional Follow-up by: Elsie Stain MD,  January 01, 2009 1:48 PM    Additional Follow-up for Phone Call Additional follow up Details #2::    Called spoke with pt advised per PW OK now for massage.  Pt needs written note authorizing massage.  Massage therapist is also pt's physical therapist.  Durward Fortes will fax copy of phone note stating OK per PW to [phone].  Phone note faxed. Follow-up by: Freddrick March RN,  January 01, 2009 1:52 PM

## 2010-10-09 NOTE — Progress Notes (Signed)
  Phone Note Outgoing Call   Summary of Call: called pt - decrease Actos to 1/2 of 45 mg.  Start Januvia 100 mg .  Wait until week of 10/22/08 for CT of abd and Pelvis Initial call taken by: D. Drema Pry DO,  October 12, 2008 8:43 AM    New/Updated Medications: ACTOS 45 MG TABS (PIOGLITAZONE HCL) Take 1/2 tablet by mouth once a day JANUVIA 100 MG TABS (SITAGLIPTIN PHOSPHATE) one by mouth once daily   Prescriptions: JANUVIA 100 MG TABS (SITAGLIPTIN PHOSPHATE) one by mouth once daily  #30 x 2   Entered and Authorized by:   D. Drema Pry DO   Signed by:   D. Drema Pry DO on 10/12/2008   Method used:   Electronically to        [facility] # [phone]* (retail)       [address]       [place], [place]  [address]       Ph: [phone]       Fax: [phone]   RxID:   [id number]

## 2010-10-09 NOTE — Assessment & Plan Note (Signed)
Summary: Pulmonary OV   Copy to:  Dr. Asencion Noble Primary Provider/Referring Provider:  D. Drema Pry DO  CC:  2 mo. f/u.  Pt states breathing is doing well overall. Pt states she took last dose of research med July 16 and currently on coumadin.[name]  History of Present Illness: History of Present Illness: Pulmonary OV       This is a 76 year old woman who presents for follow-up Pulmonary visit.  The patient presents with shortness of breath, but has no history of fever, fatigue, failure to thrive, weight loss, night sweats, nosebleeds, easy bruising, heartburn, nausea/vomiting, unable to take oral fluids/nourishment, chest pain, cough, shortness of breath with exertion, hemoptysis, joint swelling, leg swelling, peripheral edema, and URI symptoms.  The patient comes in today for follow-up of pulmonary embolism.  Since the last visit, the patient feels their symptoms are improving and do not impair daily activities.  The patient notes good compliance with treatment plan.  Evaluation since the last visit (see reports for full details) include no further diagnostic test.  The patient reports (see A&P and reports for details) no new problems or concerns.    Pt was admitted 1/21- 1/29 for Acute PE.  Pt enrolled into [place] trial and randomized to rivaroxaban. Pt is doing much better.  No pain.  No cough.  No wheeze.  Pt without any bleeding issues. Pt is scheduled for 49month rx program.  Had a rash on advair that went away when advair discontinued.  Notes some edema in feet.   February 05, 2009 4:24 PM last ov 3/10: The pt is doing well The pt had PE 1/10.   The pt was enrolled in [place] trial and enrolled to Rivaroxaban.  The pt is due to come off med soon  end of july 10.   There is no cough or dyspnea. There is no chest pain. Pt denies any significant sore throat, nasal congestion or excess secretions, fever, chills, sweats, unintended weight loss, pleurtic or exertional chest pain, orthopnea  PND, or leg swelling Pt denies any increase in rescue therapy over baseline, denies waking up needing it or having any early am or nocturnal exacerbations of coughing/wheezing/or dyspnea.  April 09, 2009 3:03 PM Pt is now off rivaroxaban.  Received 6 months of therapy.  Was then bridged with arixta and coumadin.  Saw hematology who recommended 20months total anticoagulation.  (rx 6 months more.) Pt has no chest pain or dyspnea.  Had a CT chest recently obtained and was negative for embolii. Dr Marin Olp manages the coumadin.   Pt denies any significant sore throat, nasal congestion or excess secretions, fever, chills, sweats, unintended weight loss, pleurtic or exertional chest pain, orthopnea PND, or leg swelling   Preventive Screening-Counseling & Management  Alcohol-Tobacco     Smoking Status: quit > 6 months  Current Medications (verified): 1)  Synthroid 100 Mcg Tabs (Levothyroxine Sodium) .... Take 1 Tablet By Mouth Once A Day 2)  Fexofenadine Hcl 180 Mg Tabs (Fexofenadine Hcl) .... Take 1 Tablet By Mouth Once A Day 3)  Sertraline Hcl 100 Mg Tabs (Sertraline Hcl) .... One By Mouth Once Daily 4)  Actos 45 Mg Tabs (Pioglitazone Hcl) .... Take 1/2 Tablet By Mouth Once A Day 5)  Metformin Hcl 850 Mg Tabs (Metformin Hcl) .[name].. 1 Tab By Mouth Two Times A Day 6)  Micardis Hct 40-12.5 Mg Tabs (Telmisartan-Hctz) .... One By Mouth Once Daily 7)  Zantac 150 Maximum Strength 150 Mg Tabs (  Ranitidine Hcl) .... One Tab By Mouth Once Daily As Needed 8)  Multivitamins  Tabs (Multiple Vitamin) .... One By Mouth Once Daily 9)  Vitamin D 50000 Unit Caps (Ergocalciferol) .... One By Mouth Q Wkly 10)  Fish Oil 1000 Mg Caps (Omega-3 Fatty Acids) .... Take 2  Tablet By Mouth Two Times A Day 11)  Lipitor 40 Mg Tabs (Atorvastatin Calcium) .... One By Mouth Qd 12)  Cpap .[name].. 13 13)  Glipizide Xl 5 Mg Xr24h-Tab (Glipizide) .[name].. 1 Tab By Mouth Qam With Breakfast 14)  Coumadin 5 Mg Tabs (Warfarin Sodium) .... As  Directed  Allergies (verified): 1)  ! Sulfa 2)  ! Tetracycline 3)  ! Aspirin 4)  ! * Hormones 5)  ! Advair Diskus (Fluticasone-Salmeterol) 6)  ! Lisinopril 7)  ! Crestor (Rosuvastatin Calcium) 8)  ! * Latex 9)  Glucophage  Past History:  Past medical, surgical, family and social histories (including risk factors) reviewed, and no changes noted (except as noted below).  Past Medical History: Reviewed history from 02/18/2009 and no changes required. Current Problems:  HYPOTHYROIDISM (ICD-244.9) HYPERCHOLESTEROLEMIA (ICD-272.0) HYPERTENSION (ICD-401.9) SYNCOPE AND COLLAPSE (ICD-780.2) DIABETES MELLITUS (ICD-250.00) DEPRESSION (ICD-311)  ASTHMA, CHILDHOOD (ICD-493.00)  - no problem since adulthood Allergic rhinitis obesity  panic attacks / anxiety Acute hypoxic respiratory failure secondary to multiple bilateral pulmonary embolism 09/2008 Negative hypercoagulable workup 09/2008 hospital stay Severe obstructive sleep apnea   Past Surgical History: Reviewed history from 09/27/2008 and no changes required. thyroid removed  Family History: Reviewed history from 02/18/2009 and no changes required. Family History of Arthritis Family History Diabetes 1st degree relative Family History High cholesterol Family History Hypertension Family History of  CAD Family History of  stroke Family History of  emotional/mental illness daughters-arthritis, allergies mother-asthma and allergies, clotting disorder, arthritis mat grandmother- clotting dx, rheumatism mat uncle-clotting dx  daughter-clotting dx, allergies   Social History: Reviewed history from 03/05/2009 and no changes required. 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 Former Smoker Quit smoking 1991.  Smoked off and on x 20 yrs. Alcohol use-no Drug use-no     Review of Systems  The patient denies shortness of breath with activity, shortness of breath at rest, productive cough,  non-productive cough, coughing up blood, chest pain, irregular heartbeats, acid heartburn, indigestion, loss of appetite, weight change, abdominal pain, difficulty swallowing, sore throat, tooth/dental problems, headaches, nasal congestion/difficulty breathing through nose, sneezing, itching, ear ache, anxiety, depression, hand/feet swelling, joint stiffness or pain, rash, change in color of mucus, and fever.    Vital Signs:  Patient profile:   76 year old female Height:      62 inches Weight:      264.25 pounds BMI:     48.51 O2 Sat:      94 % on Room air Temp:     98.4 degrees F oral Pulse rate:   66 / minute BP sitting:   124 / 48  (right arm) Cuff size:   large  Vitals Entered By: Raymondo Band RN (April 09, 2009 2:41 PM)  O2 Flow:  Room air  Physical Exam  Additional Exam:  Gen. Pleasant, well-nourished, in no distress, normal affect ENT - no lesions, no post nasal drip Neck: No JVD, no thyromegaly, no carotid bruits Lungs: no use of accessory muscles, no dullness to percussion, clear without rales or rhonchi  Cardiovascular: Rhythm regular, heart sounds  normal, no murmurs or gallops, 2+ peripheral edema Abdomen: soft and non-tender, no  hepatosplenomegaly, BS normal. Musculoskeletal: No deformities, no cyanosis or clubbing Neuro:  alert, non focal     Impression & Recommendations:  Problem # 1:  PULMONARY EMBOLISM (ICD-415.19) Assessment Improved Resolved pulmonary embollii,  successfully completed 6 months of Rivaroxaban in the [place] trial.  Now on coumadin for 6 more months per hematology plan per dr Marin Olp pulm f/u prn Her updated medication list for this problem includes:    Coumadin 5 Mg Tabs (Warfarin sodium) .[name]... As directed  Orders: Est. Patient Level III [id number])  Medications Added to Medication List This Visit: 1)  Coumadin 5 Mg Tabs (Warfarin sodium) .... As directed  Complete Medication List: 1)  Synthroid 100 Mcg Tabs (Levothyroxine sodium)  .... Take 1 tablet by mouth once a day 2)  Fexofenadine Hcl 180 Mg Tabs (Fexofenadine hcl) .... Take 1 tablet by mouth once a day 3)  Sertraline Hcl 100 Mg Tabs (Sertraline hcl) .... One by mouth once daily 4)  Actos 45 Mg Tabs (Pioglitazone hcl) .... Take 1/2 tablet by mouth once a day 5)  Metformin Hcl 850 Mg Tabs (Metformin hcl) .[name].. 1 tab by mouth two times a day 6)  Micardis Hct 40-12.5 Mg Tabs (Telmisartan-hctz) .... One by mouth once daily 7)  Zantac 150 Maximum Strength 150 Mg Tabs (Ranitidine hcl) .... One tab by mouth once daily as needed 8)  Multivitamins Tabs (Multiple vitamin) .... One by mouth once daily 9)  Vitamin D 50000 Unit Caps (Ergocalciferol) .... One by mouth q wkly 10)  Fish Oil 1000 Mg Caps (Omega-3 fatty acids) .... Take 2  tablet by mouth two times a day 11)  Lipitor 40 Mg Tabs (Atorvastatin calcium) .... One by mouth qd 12)  Cpap  .[name].. 13 13)  Glipizide Xl 5 Mg Xr24h-tab (Glipizide) .[name].. 1 tab by mouth qam with breakfast 14)  Coumadin 5 Mg Tabs (Warfarin sodium) .... As directed  Patient Instructions: 1)  No change in medications 2)  I will look into an endocrine referral 3)  Return 4 months

## 2010-10-09 NOTE — Assessment & Plan Note (Signed)
Summary: 3 months/apc   Copy to:  Dr. Asencion Noble Primary Provider/Referring Provider:  D. Drema Pry DO  CC:  Pt here for follow up. Pt states she is using CPAP every night with oxygen. Pt c/o some mask discomfort.  History of Present Illness: [name] with BL idiopathic PE 1/10 presents for FU of severe obstructive sleep apnea. PE occurred while on Pro-gon B (hormone supplement, sub lingual) DIagnostic PSG in 8/09 showed severe obstructive sleep apnea with AHI 85/h, TST 121 mins - no REM sleep. Subsequent titration study showed CPAP requirement of 15 cm with small comfort gel mask Arville Go). However she felt that this pressure was too high & was not very compliant with CPAP. After PE, oximetry showed desaturation to 77% & oxygen 2 L [place] was started  [facility]). Desaturation (without CPAP) could be on the basis of PE or obstructive sleep apnea .  3/10>> autoCPAP 5-15 with 3 L O2 blended in If 90% pressure is indeed as high as 15 then we may have to leave her on automode.  6/29 >> . CPAP is with Gentiva (HCS) & O2 with [facility]. No desaturation with exertion today.  Reviewed ONO >> desatn less than 88% for 35 mins, sustained during ? REM sleep, ct O2  with CPAP during sleepSeptember 24, 2010 11:19 AM  Switched to coumadin after Heme input.. Bruising on her back. Tolerating autoCPAP well.  feels refreshed in am, no excessive daytime somnolence, mask OK, pressure ok , loves auto- mode. Continues to desaturate with exertion.  Current Medications (verified): 1)  Synthroid 100 Mcg Tabs (Levothyroxine Sodium) .... Take 1 Tablet By Mouth Once A Day 2)  Fexofenadine Hcl 180 Mg Tabs (Fexofenadine Hcl) .... Take 1 Tablet By Mouth Once A Day 3)  Sertraline Hcl 100 Mg Tabs (Sertraline Hcl) .... One By Mouth Once Daily 4)  Actos 30 Mg Tabs (Pioglitazone Hcl) .... Take 1 Tablet By Mouth Once A Day 5)  Micardis Hct 40-12.5 Mg Tabs (Telmisartan-Hctz) .... One By Mouth Once Daily 6)  Zantac 150 Maximum Strength 150 Mg  Tabs (Ranitidine Hcl) .... One Tab By Mouth Once Daily As Needed 7)  Multivitamins  Tabs (Multiple Vitamin) .... One By Mouth Once Daily 8)  Vitamin D 2000 Unit Caps (Cholecalciferol) .... Take 1 Capsule By Mouth Once A Day 9)  Fish Oil 1000 Mg Caps (Omega-3 Fatty Acids) .... Take 2  Tablet By Mouth Two Times A Day 10)  Lipitor 20 Mg Tabs (Atorvastatin Calcium) .... Take 1 Tablet By Mouth Once A Day 11)  Cpap .Marland Kitchen.. 13 12)  Coumadin 5 Mg Tabs (Warfarin Sodium) .... As Directed 13)  Januvia 100 Mg Tabs (Sitagliptin Phosphate) .... Take 1 Tablet By Mouth Once A Day 14)  Co Q-10 200 Mg Caps (Coenzyme Q10) .... Take 1 Tablet By Mouth Once A Day  Allergies (verified): 1)  ! Sulfa 2)  ! Tetracycline 3)  ! Aspirin 4)  ! * Hormones 5)  ! Advair Diskus (Fluticasone-Salmeterol) 6)  ! Lisinopril 7)  ! * Latex 8)  Glucophage  Past History:  Past Medical History: Last updated: 05/21/2009 Current Problems:  HYPOTHYROIDISM (ICD-244.9) HYPERCHOLESTEROLEMIA (ICD-272.0)  HYPERTENSION (ICD-401.9) SYNCOPE AND COLLAPSE (ICD-780.2) DIABETES MELLITUS (ICD-250.00) DEPRESSION (ICD-311)  ASTHMA, CHILDHOOD (ICD-493.00)  - no problem since adulthood Allergic rhinitis obesity  panic attacks / anxiety Acute hypoxic respiratory failure secondary to multiple bilateral pulmonary embolism 09/2008 Negative hypercoagulable workup 09/2008 hospital stay Severe obstructive sleep apnea   Social History: Last updated: 05/21/2009 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 Former Smoker Quit smoking 1991.  Smoked off and on x 20 yrs. Alcohol use-no Drug use-no      Review of Systems       The patient complains of dyspnea on exertion.  The patient denies anorexia, fever, weight loss, weight gain, vision loss, decreased hearing, hoarseness, chest pain, syncope, peripheral edema, prolonged cough, headaches, hemoptysis, abdominal pain, melena, hematochezia, severe  indigestion/heartburn, hematuria, muscle weakness, suspicious skin lesions, difficulty walking, depression, unusual weight change, abnormal bleeding, and enlarged lymph nodes.    Vital Signs:  Patient profile:   76 year old female Height:      62 inches Weight:      259.25 pounds O2 Sat:      94 % on Room air Temp:     98.4 degrees F oral Pulse rate:   58 / minute BP sitting:   108 / 58  (left arm) Cuff size:   large  Vitals Entered By: Iran Planas CMA (May 31, 2009 11:06 AM)  O2 Flow:  Room air CC: Pt here for follow up. Pt states she is using CPAP every night with oxygen. Pt c/o some mask discomfort Comments Medications reviewed with patient Iran Planas CMA  May 31, 2009 11:07 AM   Ambulatory Pulse Oximetry  Resting; HR_60____    02 Sat__96%ra___  Lap1 (185 feet)   HR__90___   02 Sat__88%ra___ Lap2 (185 feet)   HR_____   02 Sat_____    Lap3 (185 feet)   HR_____   02 Sat_____  ___Test Completed without Difficulty _x__Test Stopped due to:   pt o2 sat dec to 88%ra at end of 1st lap.   Raymondo Band RN  May 31, 2009 11:39 AM    Physical Exam  Additional Exam:  Gen. Pleasant, well-nourished, in no distress, normal affect ENT - no lesions, no post nasal drip Neck: No JVD, no thyromegaly, no carotid bruits Lungs: no use of accessory muscles, no dullness to percussion, clear without rales or rhonchi  Cardiovascular: Rhythm regular, heart sounds  normal, no murmurs or gallops, 2+ peripheral edema Musculoskeletal: No deformities, no cyanosis or clubbing      Impression & Recommendations:  Problem # 1:  SLEEP APNEA (ICD-780.57) Ct autoCPAP 5-15 with 3 L O2 blended. Recheck downlaod & oximetry, but feel it maybe best to leave her on auto mode. Orders: Est. Patient Level III [id number]) DME Referral (DME)  Problem # 2:  PULMONARY EMBOLISM (ICD-415.19) coumadin x ? another year. Hypercoagulable w/u was neg. Her updated medication list for this  problem includes:    Coumadin 5 Mg Tabs (Warfarin sodium) .Marland Kitchen... As directed  Medications Added to Medication List This Visit: 1)  Actos 30 Mg Tabs (Pioglitazone hcl) .... Take 1 tablet by mouth once a day 2)  Lipitor 20 Mg Tabs (Atorvastatin calcium) .... Take 1 tablet by mouth once a day 3)  Auto Cpap  .... 5-15 4)  Januvia 100 Mg Tabs (Sitagliptin phosphate) .... Take 1 tablet by mouth once a day 5)  Co Q-10 200 Mg Caps (Coenzyme q10) .... Take 1 tablet by mouth once a day  Patient Instructions: 1)  Stay on auto CPAP & oxygen. 2)  Please schedule a follow-up appointment in 6 months. 3)  Call us a month beofre & we will check your oxygen levels at noght again (OFF OXYGEN, ON CPAP)

## 2010-10-09 NOTE — Miscellaneous (Signed)
Summary: Orders Update//cxr charge for 07/28/10  Clinical Lists Changes  Orders: Added new Test order of T-2 View CXR ([id number]) - Signed

## 2010-10-09 NOTE — Progress Notes (Signed)
Summary: Need new machine  Phone Note Outgoing Call Call back at [facility] Phone [phone]   Summary of Call: Pt c/o having a loaner cpap machine with [facility] while her machine is being sent off for repair, however, for what ever reason her machine is not going to be fixed and they are going to return her broken machine to her a retrieve their machine from her. Pt was told by [facility] that they are waiting for RA to place new machine in her home. Will forward to RA for review. Please advise. Thanks. Iran Planas CMA  July 09, 2010 9:19 AM   Follow-up for Phone Call        She wants to switch to [facility]. (has O2 through them already, cpap was through [facility]) Can you have [facility] look intot he matter . If she really needs a new machine , OK with me .Have ordered Follow-up by: Leanna Sato. Elsworth Soho MD,  July 09, 2010 10:45 AM  Additional Follow-up for Phone Call Additional follow up Details #1::        LMOAM for [place] with [facility] to return my call. Rhonda Cobb  July 09, 2010 11:34 AM [facility] has been provided Sleep Studies and order to provide pt with a cpap auto  Range 10-15 and download to Dr. Elsworth Soho in 4 wks. Rhonda Cobb  July 10, 2010 2:10 PM  Additional Follow-up by: Leanna Sato. Elsworth Soho MD,  July 10, 2010 3:09 PM

## 2010-10-09 NOTE — Letter (Signed)
Summary: [facility]   Imported By: Edmonia James 07/23/2010 09:27:22  _____________________________________________________________________  External Attachment:    Type:   Image     Comment:   External Document

## 2010-10-09 NOTE — Consult Note (Signed)
Summary: [facility]   Imported By: Edmonia James 06/21/2009 08:39:45  _____________________________________________________________________  External Attachment:    Type:   Image     Comment:   External Document

## 2010-10-09 NOTE — Assessment & Plan Note (Signed)
Summary: FASTING ROV-DR WILSON//CH   Vital Signs:  Patient profile:   76 year old female Weight:      255.50 pounds BMI:     45.42 Temp:     98.0 degrees F oral Pulse rate:   80 / minute Pulse rhythm:   regular Resp:     18 per minute BP sitting:   140 / 70  (right arm) Cuff size:   large  Vitals Entered By: Jiles Garter CMA (Jan 07, 2009 9:13 AM)  Primary Care Provider:  Orinda Kenner MD  CC:  Follow up disease management and Type 2 diabetes mellitus follow-up.  History of Present Illness:  Type 2 Diabetes Mellitus Follow-Up      This is a 76 year old woman who presents for Type 2 diabetes mellitus follow-up.  The patient denies self managed hypoglycemia and weight gain.  The patient denies the following symptoms: chest pain.  Since the last visit the patient reports compliance with medications and monitoring blood glucose.  Her AM blood sugars have been trending higher. She reports less physical activity since diagnosis of pulmonary embolism.  Htn - stable  Hyperlipidemia - Crestor prev discontinued secondary to muscle aches.  Allergies: 1)  ! Sulfa 2)  ! Tetracycline 3)  ! Aspirin 4)  ! * Hormones 5)  ! Advair Diskus (Fluticasone-Salmeterol) 6)  ! Lisinopril 7)  ! Crestor (Rosuvastatin Calcium) 8)  ! * Latex 9)  Glucophage  Past History:  Past Medical History:    Current Problems:     HYPOTHYROIDISM (ICD-244.9)    HYPERCHOLESTEROLEMIA (ICD-272.0)    HYPERTENSION (ICD-401.9)    SYNCOPE AND COLLAPSE (ICD-780.2)    DIABETES MELLITUS (ICD-250.00)    DEPRESSION (ICD-311)    ASTHMA, CHILDHOOD (ICD-493.00)  - no problem since adulthood    Allergic rhinitis    obesity    panic attacks / anxiety    Acute hypoxic respiratory failure secondary to multiple bilateral pulmonary embolism 09/2008    Negative hypercoagulable workup 09/2008 hospital stay    sleep apnea   Family History:    Family History of Arthritis    Family History Diabetes 1st degree relative  Family History High cholesterol    Family History Hypertension    Family History of  CAD    Family History of  stroke    Family History of  emotional/mental illness    daughters-arthritis, allergies    mother-asthma and allergies, clotting disorder, arthritis    mat grandmother- clotting dx, rheumatism    mat uncle-clotting dx     daughter-clotting dx, allergies  Social History:    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    Former Smoker Quit smoking 1991.  Smoked off and on x 20 yrs.    Alcohol use-no    Drug use-no    Physical Exam  General:  alert and overweight-appearing.   Neck:  supple and no masses.  no carotid bruits.   Lungs:  normal respiratory effort and normal breath sounds.   Heart:  normal rate, regular rhythm, and no gallop.   Abdomen:  soft and non-tender.   Extremities:  trace left pedal edema and trace right pedal edema.    Diabetes Management Exam:    Foot Exam (with socks and/or shoes not present):       Inspection:          Left foot: normal          Right foot: normal  Impression & Recommendations:  Problem # 1:  DIABETES MELLITUS (ICD-250.00) Add Januvia.  Samples of Janumet provided.  I urged dietary compliance.    Her updated medication list for this problem includes:    Actos 45 Mg Tabs (Pioglitazone hcl) .Marland Kitchen... Take 1/2 tablet by mouth once a day    Janumet 50-500 Mg Tabs (Sitagliptin-metformin hcl) ..... One by mouth bid    Diovan Hct 160-12.5 Mg Tabs (Valsartan-hydrochlorothiazide) ..... One by mouth qd  Orders: TLB-BMP (Basic Metabolic Panel-BMET) (99991111) TLB-A1C / Hgb A1C (Glycohemoglobin) (83036-A1C) TLB-Microalbumin/Creat Ratio, Urine (82043-MALB)  Labs Reviewed: Creat: 0.9 (11/08/2008)     Problem # 2:  PULMONARY EMBOLISM (ICD-415.19) Pt has been on anticoagulation since Feb, 2010.   She is still afraid to exert herself but her BP and resp status has returned to baseline.   Pt advised to resume  her normal activities.    Problem # 3:  HYPERCHOLESTEROLEMIA (ICD-272.0) Pt previously on Crestor but stopped due to musle aches.    We discussed importance of lowering lipids given multiple CV risk factors.    Repeat FLP.   Check vit d  level.  Consider re try pt on Crestor or Lipitor  Orders: TLB-Lipid Panel (80061-LIPID) TLB-Hepatic/Liver Function Pnl (80076-HEPATIC) TLB-TSH (Thyroid Stimulating Hormone) (84443-TSH)  Complete Medication List: 1)  Synthroid 100 Mcg Tabs (Levothyroxine sodium) .... Take 1 tablet by mouth once a day 2)  Fexofenadine Hcl 180 Mg Tabs (Fexofenadine hcl) .... Take 1 tablet by mouth once a day 3)  Sertraline Hcl 100 Mg Tabs (Sertraline hcl) .... One by mouth once daily 4)  Actos 45 Mg Tabs (Pioglitazone hcl) .... Take 1/2 tablet by mouth once a day 5)  Janumet 50-500 Mg Tabs (Sitagliptin-metformin hcl) .... One by mouth bid 6)  Rivaroxaban - For Pulmonary Embolism Tx  .... Patient is in a phase iii study with dr. Asencion Noble 20mg  by mouth daily 7)  Diovan Hct 160-12.5 Mg Tabs (Valsartan-hydrochlorothiazide) .... One by mouth qd 8)  Zantac 150 Maximum Strength 150 Mg Tabs (Ranitidine hcl) .... One tab by mouth once daily as needed 9)  Multivitamins Tabs (Multiple vitamin) .... One by mouth once daily 10)  Vitamin D 50000 Unit Caps (Ergocalciferol) .... One by mouth q wkly  Other Orders: T-Vitamin D (25-Hydroxy) [id number])  Patient Instructions: 1)  Please schedule a follow-up appointment in 6 weeks. 2)  BMP prior to visit, ICD-9: 401.9 3)  Please return for lab work one (1) week before your next appointment.  4)  Call our office if you experience dizziness. Prescriptions: JANUMET 50-500 MG TABS (SITAGLIPTIN-METFORMIN HCL) one by mouth bid  #60 x 3   Entered and Authorized by:   D. Drema Pry DO   Signed by:   D. Drema Pry DO on 01/07/2009   Method used:   Electronically to        [facility] # [phone]* (retail)       [address]        [place], [place]  [address]       Ph: [phone]       Fax: [phone]   RxID:   [id number] DIOVAN HCT 160-12.5 MG TABS (VALSARTAN-HYDROCHLOROTHIAZIDE) one by mouth qd  #30 x 2   Entered and Authorized by:   D. Drema Pry DO   Signed by:   D. Drema Pry DO on 01/07/2009   Method used:   Electronically to        [facility] [address] # [phone]* (retail)  [place], [place]  [address]       Ph: [phone]       Fax: [phone]   RxID:   [id number]     Preventive Care Screening  Last Flu Shot:    Date:  07/23/2008    Results:  given   Last Pneumovax:    Date:  01/30/2008    Results:  given   Last Tetanus Booster:    Date:  01/25/2006    Results:  Tdap

## 2010-10-09 NOTE — Progress Notes (Signed)
Summary: Medication Status  Phone Note Call from Patient Call back at Home Phone [phone]   Caller: Patient Call For: D. Drema Pry DO Summary of Call: patient called and left voice message stating she was out of the mucinex and did not have a rx for the nasal spray. She wanted to know if Dr Shawna Orleans wanted her to continue with taking the medications.  call was returned to patient at [phone], for clarification of voice message. Female individual stated patient was in therapy. Message was left for patient to return call regarding phone message Initial call taken by: Jiles Garter CMA,  June 30, 2010 11:54 AM  Follow-up for Phone Call        patient states that she would like to know if she is to continue with the flonase nasal spray. She states she was given the rx while she was in the hospital and the Mucinex she is finished with. She states she is still having some nasal congestion Follow-up by: Jiles Garter CMA,  June 30, 2010 4:52 PM  Additional Follow-up for Phone Call Additional follow up Details #1::        no, I do not recommend she cont flonase.  if she is still having nasal congestion, I suggest she use OTC saline nose spray Additional Follow-up by: D. Drema Pry DO,  June 30, 2010 5:11 PM    Additional Follow-up for Phone Call Additional follow up Details #2::    call returned to patient, she has been advised per Dr Shawna Orleans instructions Follow-up by: Jiles Garter CMA,  July 01, 2010 8:11 AM

## 2010-10-09 NOTE — Letter (Signed)
Summary: [place]   Imported By: Edmonia James 09/18/2010 11:45:16  _____________________________________________________________________  External Attachment:    Type:   Image     Comment:   External Document

## 2010-10-09 NOTE — Letter (Signed)
Summary: [facility]  [facility]   Imported By: Edmonia James 06/03/2009 09:07:56  _____________________________________________________________________  External Attachment:    Type:   Image     Comment:   External Document

## 2010-10-09 NOTE — Letter (Signed)
   [facility] [place], [place]  [address]  [place] Phone: [phone]      May 24, 2009   TRANAE EICKMEYER [address] [place], [place] [address]  RE:  LAB RESULTS  Dear  Ms. Lacombe,  The following is an interpretation of your most recent lab tests.  Please take note of any instructions provided or changes to medications that have resulted from your lab work.  ELECTROLYTES:  Good - no changes needed  KIDNEY FUNCTION TESTS:  Good - no changes needed  LIVER FUNCTION TESTS:  Good - no changes needed  LIPID PANEL:  Stable - no changes needed, Fair - review at your next visit Triglyceride: 253   Cholesterol: 173   LDL: 73   HDL: 49   Chol/HDL%:  8  THYROID STUDIES:  Thyroid studies normal TSH: 2.539     DIABETIC STUDIES:  Fair - schedule a follow-up appointment Blood Glucose: 144   HgbA1C: 7.7   Microalbumin/Creatinine Ratio: 16.5      I will further discuss your lab results at your next follow up appointment.     Sincerely Yours,    Dr. Drema Pry

## 2010-10-09 NOTE — Progress Notes (Signed)
Summary: Phone note  Phone Note From Pharmacy   Caller: Lowanda [address] Dr. # [phone]* Details for Reason: needs prior authorization Summary of Call: Please call pharmacy and let them know when you sent her authorization in for pt. to get a refill on Januvia. Pharmacy said they had faxed 3 request last week. Thank you, Anderson Malta Initial call taken by: Otho Ket,  October 17, 2008 10:33 AM  Follow-up for Phone Call        Patient has been approved for Januvia x 3 years per [facility]. Informed pharmacy and pharmacy will contact the patient to let her know. Follow-up by: Marcelino Freestone RMA,  October 18, 2008 11:33 AM

## 2010-10-09 NOTE — Assessment & Plan Note (Signed)
Summary: was seen last week, but feels worse- jr   Vital Signs:  Patient profile:   76 year old female Weight:      259 pounds BMI:     47.54 O2 Sat:      95 % on Room air Temp:     98.0 degrees F oral Pulse rate:   58 / minute Pulse rhythm:   regular Resp:     18 per minute BP sitting:   140 / 66  (right arm) Cuff size:   large  Vitals Entered By: Jiles Garter CMA (September 30, 2009 1:24 PM)  O2 Flow:  Room air  Primary Care Provider:  D. Drema Pry DO  CC:  One Week Follow up.  History of Present Illness: 76 y/o white female c/o unresolved chest congestion, productive cough-greenish brown, tempertaure 99.1 about 2 hours ago . She had taken tylenol earlier in the day with some relief.  no resting SOB but dyspnea with exertion  Allergies: 1)  ! Sulfa 2)  ! Tetracycline 3)  ! Aspirin 4)  ! * Hormones 5)  ! Advair Diskus (Fluticasone-Salmeterol) 6)  ! Lisinopril 7)  ! * Latex 8)  Glucophage 9)  * Ivp Dye  Past History:  Past Medical History: Current Problems:  HYPOTHYROIDISM (ICD-244.9) HYPERCHOLESTEROLEMIA (ICD-272.0)   HYPERTENSION (ICD-401.9)   SYNCOPE AND COLLAPSE (ICD-780.2)   DIABETES MELLITUS (ICD-250.00) DEPRESSION (ICD-311)  ASTHMA, CHILDHOOD (ICD-493.00)  - no problem since adulthood Allergic rhinitis obesity  panic attacks / anxiety Acute hypoxic respiratory failure secondary to multiple bilateral pulmonary embolism 09/2008 Negative hypercoagulable workup 09/2008 hospital stay Severe obstructive sleep apnea   Past Surgical History: thyroid removed        Family History: Family History of Arthritis Family History Diabetes 1st degree relative Family History High cholesterol Family History Hypertension Family History of  CAD Family History of  stroke Family History of  emotional/mental illness daughters-arthritis, allergies mother-asthma and allergies, clotting disorder, arthritis mat grandmother- clotting dx, rheumatism mat uncle-clotting  dx  daughter-clotting dx, allergies         Social History: 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 Former Smoker Quit smoking 1991.  Smoked off and on x 20 yrs. Alcohol use-no   Drug use-no          Physical Exam  General:  alert and overweight-appearing.   Lungs:  normal respiratory effort.  slightly coarse breath sounds bilaterally Heart:  normal rate, regular rhythm, and no gallop.     Impression & Recommendations:  Problem # 1:  COUGH (Z2878448.2) Pt with persistent cough - bronchitis.  use azithro to cover atypicals .  Patient advised to call office if symptoms persist or worsen.  Orders: T-2 View CXR, Same Day ([id number])  Complete Medication List: 1)  Synthroid 100 Mcg Tabs (Levothyroxine sodium) .... Take 1 tablet by mouth once a day 2)  Fexofenadine Hcl 180 Mg Tabs (Fexofenadine hcl) .... Take 1 tablet by mouth once a day 3)  Sertraline Hcl 100 Mg Tabs (Sertraline hcl) .... One by mouth once daily 4)  Actos 30 Mg Tabs (Pioglitazone hcl) .... Take 1 tablet by mouth once a day 5)  Micardis Hct 40-12.5 Mg Tabs (Telmisartan-hctz) .... One by mouth once daily 6)  Zantac 150 Maximum Strength 150 Mg Tabs (Ranitidine hcl) .... One tab by mouth once daily as needed 7)  Multivitamins Tabs (Multiple vitamin) .... One by mouth once daily 8)  Vitamin D 2000 Unit Caps (  Cholecalciferol) .... Take 1 capsule by mouth once a day 9)  Fish Oil 1000 Mg Caps (Omega-3 fatty acids) .... Take 2  tablet by mouth two times a day 10)  Crestor 20 Mg Tabs (Rosuvastatin calcium) .... Take 1 tablet by mouth once a day 11)  Auto Cpap  .... 5-15 12)  Coumadin 5 Mg Tabs (Warfarin sodium) .... As directed 13)  Januvia 100 Mg Tabs (Sitagliptin phosphate) .... Take 1 tablet by mouth once a day 14)  Co Q-10 200 Mg Caps (Coenzyme q10) .... Take 1 tablet by mouth once a day 15)  Glimepiride 2 Mg Tabs (Glimepiride) .... Take 1/2  tablet by mouth once a day 16)   Cefuroxime Axetil 500 Mg Tabs (Cefuroxime axetil) .... One by mouth two times a day 17)  Azithromycin 250 Mg Tabs (Azithromycin) .... 2 tabs on day one, then one by mouth once daily x 4 days 18)  Dulera 100-5 Mcg/act Aero (Mometasone furo-formoterol fum) .... 2 puffs two times a day 19)  Benzonatate 100 Mg Caps (Benzonatate) .... One by mouth three times a day as needed  Patient Instructions: 1)  Call our office if your symptoms do not  improve or gets worse. Prescriptions: AZITHROMYCIN 250 MG TABS (AZITHROMYCIN) 2 tabs on day one, then one by mouth once daily x 4 days  #6 x 0   Entered and Authorized by:   D. Drema Pry DO   Signed by:   D. Drema Pry DO on 09/30/2009   Method used:   Electronically to        [facility] [address] # [phone]* (retail)       [address]       [place], [place]  [address]       Ph: [phone]       Fax: [phone]   RxID:   [id number]   Current Allergies (reviewed today): ! SULFA ! TETRACYCLINE ! ASPIRIN ! * HORMONES ! ADVAIR DISKUS (FLUTICASONE-SALMETEROL) ! LISINOPRIL ! * LATEX GLUCOPHAGE * IVP DYE

## 2010-10-09 NOTE — Assessment & Plan Note (Signed)
Summary: cellulitist/dt   Vital Signs:  Patient profile:   76 year old female Height:      62 inches Weight:      264 pounds BMI:     48.46 Temp:     98.4 degrees F oral Pulse rate:   62 / minute BP sitting:   154 / 70  (left arm) Cuff size:   large  Vitals Entered By: Sherlean Foot CMA (January 02, 2010 10:34 AM) CC: Cellulitis B lower legs. Finishing ABX tonight but legs are no better.   Primary Care Provider:  Jennings Books DO  CC:  Cellulitis B lower legs. Finishing ABX tonight but legs are no better.Marland Kitchen  History of Present Illness: 76 y/o white female with hx of morbid obesity, PE c/o LE redness. her symptoms started while on vacation Dr Marin Olp called in keflex redness better but not completely resolved. no fever or chills  Allergies: 1)  ! Sulfa 2)  ! Tetracycline 3)  ! Aspirin 4)  ! * Hormones 5)  ! Advair Diskus (Fluticasone-Salmeterol) 6)  ! Lisinopril 7)  ! * Latex 8)  Glucophage 9)  * Ivp Dye  Past History:  Past Medical History: Current Problems:  HYPOTHYROIDISM (ICD-244.9) HYPERCHOLESTEROLEMIA (ICD-272.0)   HYPERTENSION (ICD-401.9)   SYNCOPE AND COLLAPSE (ICD-780.2)    DIABETES MELLITUS (ICD-250.00) DEPRESSION (ICD-311)  ASTHMA, CHILDHOOD (ICD-493.00)  - no problem since adulthood Allergic rhinitis obesity  panic attacks / anxiety Acute hypoxic respiratory failure secondary to multiple bilateral pulmonary embolism 09/2008 Negative hypercoagulable workup 09/2008 hospital stay Severe obstructive sleep apnea   Past Surgical History: thyroid removed         Family History: Family History of Arthritis Family History Diabetes 1st degree relative Family History High cholesterol Family History Hypertension Family History of  CAD Family History of  stroke Family History of  emotional/mental illness daughters-arthritis, allergies mother-asthma and allergies, clotting disorder, arthritis mat grandmother- clotting dx, rheumatism mat uncle-clotting  dx  daughter-clotting dx, allergies          Social History: 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 Former Smoker Quit smoking 1991.  Smoked off and on x 20 yrs. Alcohol use-no   Drug use-no           Physical Exam  General:  morbidly obese, pleasant, NAD Lungs:  normal respiratory effort and normal breath sounds.   Heart:  normal rate, regular rhythm, and no gallop.   Extremities:  trace left pedal edema and trace right pedal edema.   Skin:  mild redness of both lower ext,  no streaking   Impression & Recommendations:  Problem # 1:  CELLULITIS, LEGS (ICD-682.6) Assessment Improved pt with mild LE cellulitis vs chronic venous stasis.  complete 10 days course of abx.  Patient advised to call office if symptoms persist or worsen.  The following medications were removed from the medication list:    Cefuroxime Axetil 500 Mg Tabs (Cefuroxime axetil) ..... One by mouth two times a day    Azithromycin 250 Mg Tabs (Azithromycin) .Marland Kitchen... 2 tabs on day one, then one by mouth once daily x 4 days Her updated medication list for this problem includes:    Cephalexin 500 Mg Caps (Cephalexin) .Marland Kitchen... 2 caps two times a day  Complete Medication List: 1)  Synthroid 100 Mcg Tabs (Levothyroxine sodium) .... Take 1 tablet by mouth once a day 2)  Fexofenadine Hcl 180 Mg Tabs (Fexofenadine hcl) .... Take 1 tablet by mouth  once a day 3)  Sertraline Hcl 100 Mg Tabs (Sertraline hcl) .... One by mouth once daily 4)  Actos 15 Mg Tabs (Pioglitazone hcl) .... One by mouth once daily 5)  Micardis Hct 40-12.5 Mg Tabs (Telmisartan-hctz) .... One by mouth once daily 6)  Zantac 150 Maximum Strength 150 Mg Tabs (Ranitidine hcl) .... One tab by mouth once daily as needed 7)  Multivitamins Tabs (Multiple vitamin) .... One by mouth once daily 8)  Vitamin D 2000 Unit Caps (Cholecalciferol) .... Take 1 capsule by mouth once a day 9)  Fish Oil 1000 Mg Caps (Omega-3 fatty acids) ....  Take 2  tablet by mouth two times a day 10)  Crestor 20 Mg Tabs (Rosuvastatin calcium) .... Take 1 tablet by mouth once a day 11)  Auto Cpap  .... 5-15 12)  Coumadin 5 Mg Tabs (Warfarin sodium) .... As directed 13)  Januvia 100 Mg Tabs (Sitagliptin phosphate) .... Take 1 tablet by mouth once a day 14)  Co Q-10 200 Mg Caps (Coenzyme q10) .... Take 1 tablet by mouth once a day 15)  Glimepiride 2 Mg Tabs (Glimepiride) .... Take 1/2  tablet by mouth once a day 16)  Dulera 100-5 Mcg/act Aero (Mometasone furo-formoterol fum) .... 2 puffs two times a day 17)  Benzonatate 100 Mg Caps (Benzonatate) .... One by mouth three times a day as needed 18)  Cephalexin 500 Mg Caps (Cephalexin) .... 2 caps two times a day  Patient Instructions: 1)  Use lamisil cream OTC twice daily x 1 week as directed (start two weeks from now). 2)  Call our office if your cellulitis does not  improve or gets worse. 3)  Follow up with Dr. Suzette Battiest re:  diabetes and wt gain  4)  discuss use of Victoza with Dr. Suzette Battiest Prescriptions: CEPHALEXIN 500 MG CAPS (CEPHALEXIN) 2 caps two times a day  #20 x 0   Entered and Authorized by:   D. Drema Pry DO   Signed by:   D. Drema Pry DO on 01/02/2010   Method used:   Electronically to        [facility] # [phone]* (retail)       [address]       [place], [place]  [address]       Ph: [phone]       Fax: [phone]   RxID:   [id number]

## 2010-10-09 NOTE — Progress Notes (Signed)
Summary: patient in hospital   Phone Note Call from Patient   Caller: Patient Call For: yoo  Complaint: Urinary/GYN Problems Summary of Call: Patient called to let you know she is in the hospital ([facility]) with pneumonia  Initial call taken by: Shanon Payor,  June 09, 2010 8:01 AM  Follow-up for Phone Call        noted Follow-up by: D. Drema Pry DO,  June 09, 2010 12:02 PM

## 2010-10-09 NOTE — Progress Notes (Signed)
Summary: Cough  Phone Note Call from Patient Call back at Home Phone [phone]   Caller: Daughter-Lisa Job Summary of Call: patients daughter [name] called and left voice message stating paitient still has cough. The delsym and mucinex does not seem to be working. The couhg is keeping the patient awake. [name] would like to know if there is a stronger cough medication that could be sent into the pharmacy for the patient. Initial call taken by: Jiles Garter CMA,  October 03, 2009 12:07 PM  Follow-up for Phone Call        try tessalon perles -  see rx Follow-up by: D. Drema Pry DO,  October 03, 2009 1:10 PM  Additional Follow-up for Phone Call Additional follow up Details #1::        patient advised per Dr Shawna Orleans instructions, informed rx sent to pharmacy Additional Follow-up by: Jiles Garter CMA,  October 03, 2009 2:12 PM    New/Updated Medications: BENZONATATE 100 MG CAPS (BENZONATATE) one by mouth three times a day as needed Prescriptions: BENZONATATE 100 MG CAPS (BENZONATATE) one by mouth three times a day as needed  #21 x 0   Entered and Authorized by:   D. Drema Pry DO   Signed by:   D. Drema Pry DO on 10/03/2009   Method used:   Electronically to        [facility] # [phone]* (retail)       [address] [place] [address]       [place], [place]  [address]       Ph: [phone]       Fax: [phone]   [place]:   [phone]

## 2010-10-09 NOTE — Progress Notes (Signed)
Summary: Muscle Spasms  Phone Note Call from Patient Call back at Home Phone [phone]   Caller: Patient Summary of Call: patient called and left voice message stating she is have bad muscle spasms going down th back of her legs and she is not sure of the cause. She would like to know if she could get a rx for this, or could this be a side effect from one her medications that she is currently taking Initial call taken by: Jiles Garter CMA,  June 11, 2009 2:45 PM  Follow-up for Phone Call        I suggest office visit Follow-up by: D. Drema Pry DO,  June 11, 2009 2:50 PM  Additional Follow-up for Phone Call Additional follow up Details #1::        appt made Shanon Payor  June 11, 2009 3:24 PM

## 2010-10-09 NOTE — Miscellaneous (Signed)
Summary: Lab Results  Clinical Lists Changes  Observations: Added new observation of TSH: 2.539 microintl units/mL (05/20/2009 9:47) Added new observation of T4, FREE: 0.83 ng/dL (05/20/2009 9:47) Added new observation of HGBA1C: 7.7 % (05/20/2009 9:47) Added new observation of CALCIUM: 9.1 mg/dL (05/20/2009 9:47) Added new observation of ALBUMIN: 4.3 g/dL (05/20/2009 9:47) Added new observation of PROTEIN, TOT: 7.0 g/dL (05/20/2009 9:47) Added new observation of SGPT (ALT): 32 units/L (05/20/2009 9:47) Added new observation of SGOT (AST): 35 units/L (05/20/2009 9:47) Added new observation of ALK PHOS: 70 units/L (05/20/2009 9:47) Added new observation of BILI DIRECT: 0.10 mg/dL (05/20/2009 9:47) Added new observation of BILI TOTAL: 0.4 mg/dL (05/20/2009 9:47) Added new observation of GFR: 65.57 mL/min (05/20/2009 9:47) Added new observation of CREATININE: 1.06 mg/dL (05/20/2009 9:47) Added new observation of BUN: 19 mg/dL (05/20/2009 9:47) Added new observation of BG RANDOM: 144 mg/dL (05/20/2009 9:47) Added new observation of CO2 PLSM/SER: 29 meq/L (05/20/2009 9:47) Added new observation of CL SERUM: 102 meq/L (05/20/2009 9:47) Added new observation of K SERUM: 4.2 meq/L (05/20/2009 9:47) Added new observation of NA: 144 meq/L (05/20/2009 9:47) Added new observation of VLDL: 51 mg/dL (05/20/2009 9:47) Added new observation of LDL: 73 mg/dL (05/20/2009 9:47) Added new observation of HDL: 49 mg/dL (05/20/2009 9:47) Added new observation of TRIGLYC TOT: 253 mg/dL (05/20/2009 9:47) Added new observation of CHOLESTEROL: 173 mg/dL (05/20/2009 9:47)

## 2010-10-09 NOTE — Letter (Signed)
Summary: [facility]   Imported By: Edmonia James 07/31/2009 10:54:59  _____________________________________________________________________  External Attachment:    Type:   Image     Comment:   External Document

## 2010-10-09 NOTE — Assessment & Plan Note (Signed)
Summary: 3 MONTH FOLLOW UP/MHF   Vital Signs:  Patient profile:   76 year old female Weight:      254.25 pounds BMI:     46.67 O2 Sat:      97 % on Room air Temp:     97.9 degrees F oral Pulse rate:   93 / minute Pulse rhythm:   regular Resp:     16 per minute BP sitting:   128 / 66  (right arm) Cuff size:   large  Vitals Entered By: Jiles Garter CMA (August 19, 2009 2:38 PM)  O2 Flow:  Room air  Primary Care Provider:  D. Drema Pry DO  CC:  3 Month Follow up and Type 2 diabetes mellitus follow-up.  History of Present Illness: 3 Month Follow up disease management  Type 2 Diabetes Mellitus Follow-Up      This is a 76 year old woman who presents for Type 2 diabetes mellitus follow-up.  The patient reports self managed hypoglycemia, but denies hypoglycemia requiring help, weight loss, and weight gain.  The patient denies the following symptoms: chest pain.  Since the last visit the patient reports good dietary compliance, compliance with medications, and monitoring blood glucose.  Pt started on glimepiride since last OV.  low blood sugar 86 high 178 avg 135  interval hx:  seen by Dr. Marin Olp.  She has lower ext cellulitis - treated with keflex  Preventive Screening-Counseling & Management  Alcohol-Tobacco     Smoking Status: quit  Allergies: 1)  ! Sulfa 2)  ! Tetracycline 3)  ! Aspirin 4)  ! * Hormones 5)  ! Advair Diskus (Fluticasone-Salmeterol) 6)  ! Lisinopril 7)  ! * Latex 8)  Glucophage 9)  * Ivp Dye  Past History:  Past Medical History: Current Problems:  HYPOTHYROIDISM (ICD-244.9) HYPERCHOLESTEROLEMIA (ICD-272.0)  HYPERTENSION (ICD-401.9)  SYNCOPE AND COLLAPSE (ICD-780.2)  DIABETES MELLITUS (ICD-250.00) DEPRESSION (ICD-311)  ASTHMA, CHILDHOOD (ICD-493.00)  - no problem since adulthood Allergic rhinitis obesity  panic attacks / anxiety Acute hypoxic respiratory failure secondary to multiple bilateral pulmonary embolism 09/2008 Negative  hypercoagulable workup 09/2008 hospital stay Severe obstructive sleep apnea   Past Surgical History: thyroid removed     Family History: Family History of Arthritis Family History Diabetes 1st degree relative Family History High cholesterol Family History Hypertension Family History of  CAD Family History of  stroke Family History of  emotional/mental illness daughters-arthritis, allergies mother-asthma and allergies, clotting disorder, arthritis mat grandmother- clotting dx, rheumatism mat uncle-clotting dx  daughter-clotting dx, allergies      Social History: 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 Former Smoker Quit smoking 1991.  Smoked off and on x 20 yrs. Alcohol use-no Drug use-no       Smoking Status:  quit  Physical Exam  General:  alert and overweight-appearing.   Mouth:  pharynx pink and moist.   Lungs:  normal respiratory effort and normal breath sounds.   Heart:  normal rate, regular rhythm, and no gallop.   Extremities:  trace left pedal edema and trace right pedal edema.    Diabetes Management Exam:    Foot Exam (with socks and/or shoes not present):       Inspection:          Left foot: normal          Right foot: normal    Eye Exam:       Eye Exam done elsewhere  Date: 08/07/2009          Results: normal          Done by: Dr Juanito Doom   Impression & Recommendations:  Problem # 1:  DIABETES MELLITUS (ICD-250.00) Assessment Improved Pt having mild hypoglycemia.   Pt advised to decrease glimepiride dose.  If persistent hypoglycemia - DC glimepiride.  Her updated medication list for this problem includes:    Actos 30 Mg Tabs (Pioglitazone hcl) .Marland Kitchen... Take 1 tablet by mouth once a day    Micardis Hct 40-12.5 Mg Tabs (Telmisartan-hctz) ..... One by mouth once daily    Januvia 100 Mg Tabs (Sitagliptin phosphate) .Marland Kitchen... Take 1 tablet by mouth once a day    Glimepiride 2 Mg Tabs (Glimepiride) .Marland Kitchen...  Take 1/2  tablet by mouth once a day  Labs Reviewed: Creat: 1.0 (08/16/2009)     Last Eye Exam: normal (08/07/2009) Reviewed HgBA1c results: 6.8 (08/16/2009)  7.7 (05/20/2009)  Problem # 2:  HYPERTENSION (ICD-401.9) stable.  Maintain current medication regimen.  Her updated medication list for this problem includes:    Micardis Hct 40-12.5 Mg Tabs (Telmisartan-hctz) ..... One by mouth once daily  BP today: 128/66 Prior BP: 122/60 (06/13/2009)  Labs Reviewed: K+: 4.1 (08/16/2009) Creat: : 1.0 (08/16/2009)   Chol: 148 (08/16/2009)   HDL: 44.80 (08/16/2009)   LDL: 65 (08/16/2009)   TG: 190.0 (08/16/2009)  Complete Medication List: 1)  Synthroid 100 Mcg Tabs (Levothyroxine sodium) .... Take 1 tablet by mouth once a day 2)  Fexofenadine Hcl 180 Mg Tabs (Fexofenadine hcl) .... Take 1 tablet by mouth once a day 3)  Sertraline Hcl 100 Mg Tabs (Sertraline hcl) .... One by mouth once daily 4)  Actos 30 Mg Tabs (Pioglitazone hcl) .... Take 1 tablet by mouth once a day 5)  Micardis Hct 40-12.5 Mg Tabs (Telmisartan-hctz) .... One by mouth once daily 6)  Zantac 150 Maximum Strength 150 Mg Tabs (Ranitidine hcl) .... One tab by mouth once daily as needed 7)  Multivitamins Tabs (Multiple vitamin) .... One by mouth once daily 8)  Vitamin D 2000 Unit Caps (Cholecalciferol) .... Take 1 capsule by mouth once a day 9)  Fish Oil 1000 Mg Caps (Omega-3 fatty acids) .... Take 2  tablet by mouth two times a day 10)  Crestor 20 Mg Tabs (Rosuvastatin calcium) .... Take 1 tablet by mouth once a day 11)  Auto Cpap  .... 5-15 12)  Coumadin 5 Mg Tabs (Warfarin sodium) .... As directed 13)  Januvia 100 Mg Tabs (Sitagliptin phosphate) .... Take 1 tablet by mouth once a day 14)  Co Q-10 200 Mg Caps (Coenzyme q10) .... Take 1 tablet by mouth once a day 15)  Glimepiride 2 Mg Tabs (Glimepiride) .... Take 1/2  tablet by mouth once a day  Patient Instructions: 1)  Please schedule a follow-up appointment in 3 months.   Current Allergies (reviewed today): ! SULFA ! TETRACYCLINE ! ASPIRIN ! * HORMONES ! ADVAIR DISKUS (FLUTICASONE-SALMETEROL) ! LISINOPRIL ! * LATEX GLUCOPHAGE * IVP DYE

## 2010-10-09 NOTE — Assessment & Plan Note (Signed)
Summary: hyperglycemia   Vital Signs:  Patient profile:   76 year old female Weight:      259 pounds BMI:     46.05 Temp:     98.0 degrees F oral Pulse rate:   100 / minute Pulse rhythm:   regular Resp:     22 per minute BP sitting:   128 / 80  (right arm) Cuff size:   large  Vitals Entered By: Jiles Garter CMA (March 22, 2009 3:16 PM)  Primary Care Provider:  Jennings Books DO  CC:  Elevated Blood sugar.  History of Present Illness: 76 yo obese WF with T2DM presents for 'high sugars'.  She was on Prednisone pre- CT angio the last 2 days and her AM fastings were 250 and 300 the last 2 days.  Prior to this, she noticed her sugars to be 175 to 200 AM fasting while adhering to her diabetic diet, walking and taking Janumet BID and 1/2 Actos at bedtime.  She is taking an injection for PE (research medication).  She denies blurry vision, paresthesias, CP or recent infection.  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  her last A1C was 7 < 3 mos ago.  Allergies: 1)  ! Sulfa 2)  ! Tetracycline 3)  ! Aspirin 4)  ! * Hormones 5)  ! Advair Diskus (Fluticasone-Salmeterol) 6)  ! Lisinopril 7)  ! Crestor (Rosuvastatin Calcium) 8)  ! * Latex 9)  Glucophage  Past History:  Past Medical History: Reviewed history from 02/18/2009 and no changes required. Current Problems:  HYPOTHYROIDISM (ICD-244.9) HYPERCHOLESTEROLEMIA (ICD-272.0) HYPERTENSION (ICD-401.9) SYNCOPE AND COLLAPSE (ICD-780.2) DIABETES MELLITUS (ICD-250.00) DEPRESSION (ICD-311)  ASTHMA, CHILDHOOD (ICD-493.00)  - no problem since adulthood Allergic rhinitis obesity  panic attacks / anxiety Acute hypoxic respiratory failure secondary to multiple bilateral pulmonary embolism 09/2008 Negative hypercoagulable workup 09/2008 hospital stay Severe obstructive sleep apnea   Social History: Reviewed history from 03/05/2009 and no changes required. 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 Former Smoker Quit smoking 1991.  Smoked off and on x 20 yrs. Alcohol use-no Drug use-no     Review of Systems      See HPI  Physical Exam  General:  obese WF in NAD.  here with her daughter Eyes:  conjunctiva clear Nose:  no nasal discharge.   Mouth:  pharynx pink and moist.   Neck:  no masses.  thick neck circumference Lungs:  normal respiratory effort and normal breath sounds.   Heart:  normal rate, regular rhythm, and no gallop.   Extremities:  trace pedal edema bilat Skin:  color normal.   Psych:  good eye contact, not anxious appearing, and not depressed appearing.     Impression & Recommendations:  Problem # 1:  DIABETES MELLITUS (ICD-250.00) With new onset hyperglycemia, acutely due to use of Prednisone.  Off of her Janumet the next 2 days due to use of IV contrast.  Start on Novolog SSI - given to pt w/ instructions on how to self inject.  She is going to continue to check sugars AM fasting and add 2 hr PPs 2 x a day.  Continue Actos 1/2 tab at night.  Stop Janumet and replace with just metformin at 850 mg two times a day instead of 500 mg two times a day.  Add Glipizide 5 mg with breakfast.  Call if any lows < 70.  Continue diabetic diet, regular exercise.  Work on  stress reduction given added 'work stress' she has placed upon herself.  RTC in 3 wks to review sugars with Dr Shawna Orleans.  Her SSI should only be for short term use.  She will be due for an A1C next  visit.   Her updated medication list for this problem includes:    Actos 45 Mg Tabs (Pioglitazone hcl) .Marland Kitchen... Take 1/2 tablet by mouth once a day    Metformin Hcl 850 Mg Tabs (Metformin hcl) .Marland Kitchen... 1 tab by mouth two times a day    Micardis Hct 40-12.5 Mg Tabs (Telmisartan-hctz) ..... One by mouth once daily    Glipizide Xl 5 Mg Xr24h-tab (Glipizide) .Marland Kitchen... 1 tab by mouth qam with breakfast    Novolog Flexpen 100 Unit/ml Soln (Insulin aspart) ..... Inject as directed  Complete Medication List: 1)  Synthroid 100 Mcg  Tabs (Levothyroxine sodium) .... Take 1 tablet by mouth once a day 2)  Fexofenadine Hcl 180 Mg Tabs (Fexofenadine hcl) .... Take 1 tablet by mouth once a day 3)  Sertraline Hcl 100 Mg Tabs (Sertraline hcl) .... One by mouth once daily 4)  Actos 45 Mg Tabs (Pioglitazone hcl) .... Take 1/2 tablet by mouth once a day 5)  Metformin Hcl 850 Mg Tabs (Metformin hcl) .Marland Kitchen.. 1 tab by mouth two times a day 6)  Micardis Hct 40-12.5 Mg Tabs (Telmisartan-hctz) .... One by mouth once daily 7)  Zantac 150 Maximum Strength 150 Mg Tabs (Ranitidine hcl) .... One tab by mouth once daily as needed 8)  Multivitamins Tabs (Multiple vitamin) .... One by mouth once daily 9)  Vitamin D 50000 Unit Caps (Ergocalciferol) .... One by mouth q wkly 10)  Fish Oil 1000 Mg Caps (Omega-3 fatty acids) .... Take 2  tablet by mouth two times a day 11)  Lipitor 40 Mg Tabs (Atorvastatin calcium) .... One by mouth qd 12)  Cpap  .Marland Kitchen.. 13 13)  Glipizide Xl 5 Mg Xr24h-tab (Glipizide) .Marland Kitchen.. 1 tab by mouth qam with breakfast 14)  Novolog Flexpen 100 Unit/ml Soln (Insulin aspart) .... Inject as directed 15)  Bd Pen Needle Ultrafine 29g X 12.49mm Misc (Insulin pen needle) .... Use as directed  Patient Instructions: 1)  SLIDING SCALE INSULIN WITH NOVOLOG FLEXPEN 2)  BG 150-199: 1 unit  3)  BG 200-249: 2 units  4)  BG 250-299: 3 units  5)  BG 300-349: 4 units  6)  BG Over 350: 5 units  7)  >400 CALL MD 8)  New Regimen: Metformin 850 mg two times a day (start on Sunday), Glipizide XL 5 mg with Breakfast everyday and 1/2 tab of Actos every night.   9)  Return in 3 wks for A1C/ OV. Prescriptions: BD PEN NEEDLE ULTRAFINE 29G X 12.7MM MISC (INSULIN PEN NEEDLE) use as directed  #100 x 1   Entered and Authorized by:   Darean Rote DO   Signed by:   Zina Pitzer DO on 03/22/2009   Method used:   Electronically to        [facility] [address] # [id number]* (retail)       [address]       [place], [place]  [address]       Ph: [phone]       Fax:  [phone]   RxID:   [id number] NOVOLOG FLEXPEN 100 UNIT/ML SOLN (INSULIN ASPART) inject as directed  #1 box x 1   Entered and Authorized by:   Loyal Gambler DO   Signed by:   Santiago Glad  Mccabe Gloria DO on 03/22/2009   Method used:   Electronically to        [facility] # [phone]* (retail)       [address]       [place], [place]  [address]       Ph: [phone]       Fax: [phone]   RxID:   [id number] GLIPIZIDE XL 5 MG XR24H-TAB (GLIPIZIDE) 1 tab by mouth qAM with breakfast  #30 x 2   Entered and Authorized by:   Loyal Gambler DO   Signed by:   Loyal Gambler DO on 03/22/2009   Method used:   Electronically to        [facility] # [phone]* (retail)       [address]       [place], [place]  [address]       Ph: [phone]       Fax: [phone]   RxID:   [id number] METFORMIN HCL 850 MG TABS (METFORMIN HCL) 1 tab by mouth two times a day  #60 x 2   Entered and Authorized by:   Loyal Gambler DO   Signed by:   Loyal Gambler DO on 03/22/2009   Method used:   Electronically to        [facility] # [phone]* (retail)       [address]       [place], [place]  [address]       Ph: [phone]       Fax: [phone]   [place]:   [phone]    Patient Consent for Use and Disclosure of Protected Health Information Patient: [name] [facility]   Completed by: Jiles Garter CMA Date Completed: 03/22/2009   Current Allergies (reviewed today): ! SULFA ! TETRACYCLINE ! ASPIRIN ! * HORMONES ! ADVAIR DISKUS (FLUTICASONE-SALMETEROL) ! LISINOPRIL ! CRESTOR (ROSUVASTATIN CALCIUM) ! * LATEX GLUCOPHAGE

## 2010-10-10 NOTE — Medication Information (Signed)
Summary: [facility]  [facility]   Imported By: Edmonia James 02/26/2009 09:02:49  _____________________________________________________________________  External Attachment:    Type:   Image     Comment:   External Document

## 2010-10-14 ENCOUNTER — Encounter: Payer: Self-pay | Admitting: Pulmonary Disease

## 2010-10-14 ENCOUNTER — Ambulatory Visit (INDEPENDENT_AMBULATORY_CARE_PROVIDER_SITE_OTHER): Payer: Medicare Other | Admitting: Pulmonary Disease

## 2010-10-14 DIAGNOSIS — G4730 Sleep apnea, unspecified: Secondary | ICD-10-CM

## 2010-10-22 ENCOUNTER — Other Ambulatory Visit: Payer: Self-pay | Admitting: Hematology & Oncology

## 2010-10-22 ENCOUNTER — Encounter (HOSPITAL_BASED_OUTPATIENT_CLINIC_OR_DEPARTMENT_OTHER): Payer: Medicare Other | Admitting: Hematology & Oncology

## 2010-10-22 ENCOUNTER — Encounter: Payer: Self-pay | Admitting: Pulmonary Disease

## 2010-10-22 ENCOUNTER — Encounter: Payer: Medicare Other | Admitting: Hematology & Oncology

## 2010-10-22 ENCOUNTER — Encounter: Payer: Self-pay | Admitting: Internal Medicine

## 2010-10-22 DIAGNOSIS — I2699 Other pulmonary embolism without acute cor pulmonale: Secondary | ICD-10-CM

## 2010-10-22 DIAGNOSIS — Z7901 Long term (current) use of anticoagulants: Secondary | ICD-10-CM

## 2010-10-22 DIAGNOSIS — L989 Disorder of the skin and subcutaneous tissue, unspecified: Secondary | ICD-10-CM

## 2010-10-22 LAB — CBC WITH DIFFERENTIAL (CANCER CENTER ONLY)
BASO#: 0 10*3/uL (ref 0.0–0.2)
BASO%: 0.4 % (ref 0.0–2.0)
EOS%: 5.3 % (ref 0.0–7.0)
Eosinophils Absolute: 0.3 10*3/uL (ref 0.0–0.5)
HCT: 36.1 % (ref 34.8–46.6)
HGB: 12.2 g/dL (ref 11.6–15.9)
LYMPH#: 1.7 10*3/uL (ref 0.9–3.3)
LYMPH%: 28.1 % (ref 14.0–48.0)
MCH: 30 pg (ref 26.0–34.0)
MCHC: 33.7 g/dL (ref 32.0–36.0)
MCV: 89 fL (ref 81–101)
MONO#: 0.5 10*3/uL (ref 0.1–0.9)
MONO%: 8.3 % (ref 0.0–13.0)
NEUT#: 3.5 10*3/uL (ref 1.5–6.5)
NEUT%: 57.9 % (ref 39.6–80.0)
Platelets: 186 10*3/uL (ref 145–400)
RBC: 4.05 10*6/uL (ref 3.70–5.32)
RDW: 15.2 % — ABNORMAL HIGH (ref 10.5–14.6)
WBC: 6.1 10*3/uL (ref 3.9–10.0)

## 2010-10-23 LAB — D-DIMER, QUANTITATIVE: D-Dimer, Quant: 0.37 ug/mL-FEU (ref 0.00–0.48)

## 2010-10-23 NOTE — Assessment & Plan Note (Addendum)
Summary: 2 TO 3 WEEK RETURN   Copy to:  Dr. Asencion Noble Primary Provider/Referring Provider:  Jennings Books DO  CC:  3 week follow up, seen by T. Parrett for URI , finished antibiotics and nasal spray, no c/o denies sob, and cough .  History of Present Illness: 74/F with BL idiopathic PE 1/10  for FU of severe obstructive sleep apnea & hypoxia On nocturnal O2 & CPAP 10 cm, now on O2 daytime too. PE occurred while on Pro-gon B (hormone supplement, sub lingual) DIagnostic PSG in 8/09 showed severe obstructive sleep apnea with AHI 85/h, TST 121 mins - no REM sleep. Subsequent titration study showed CPAP requirement of 15 cm with small comfort gel mask Arville Go). However she felt that this pressure was too high & was not very compliant with CPAP. After PE, oximetry showed desaturation to 77% & oxygen 2 L [place] was started  [facility]). Desaturation (without CPAP) could be on the basis of PE or obstructive sleep apnea .  3/10>> autoCPAP 5-15 with 3 L O2 blended in 03/05/09 >> Reviewed ONO >> desatn less than 88% for 35 mins, sustained during ? REM sleep, ct O2  with CPAP during sleep Hospitalised 10/2-13/11 for pneumnia, hypoxia, CXR - improved BL ASD nov '11 >>Desatn on exertion download 11/7-15/11 > 10 cm adequate, leak +  October 14, 2010 12:10 PM  Off coumadin, Antibiotics x 2 weeks in Jan for sinus congestion - resolved Planning trip to [facility] in [place] for high schol reunion , ? need to take O2, Desatn to 91% on walking, good compliance with CPAP. uses o2 on recumbent bike & during sleep.  Denies chest pain, orthopnea, hemoptysis, fever, n/v/d, edema, headache        Preventive Screening-Counseling & Management  Alcohol-Tobacco     Smoking Status: quit     Year Quit: 1991     Pack years: 50yrs x 3/4ppd     Passive Smoke Exposure: yes  Caffeine-Diet-Exercise     Caffeine use/day: 2     Does Patient Exercise: yes     Type of exercise: walking     Times/week: 3  Current Medications  (verified): 1)  Synthroid 100 Mcg Tabs (Levothyroxine Sodium) .... Take 1 Tablet By Mouth Once A Day 2)  Fexofenadine Hcl 180 Mg Tabs (Fexofenadine Hcl) .... Take 1 Tablet By Mouth Once A Day 3)  Sertraline Hcl 100 Mg Tabs (Sertraline Hcl) .... One By Mouth Once Daily 4)  Zantac 150 Maximum Strength 150 Mg Tabs (Ranitidine Hcl) .... One Tab By Mouth Once Daily As Needed 5)  Multivitamins  Tabs (Multiple Vitamin) .... One By Mouth Once Daily 6)  Vitamin D 2000 Unit Caps (Cholecalciferol) .... Take 1 Capsule By Mouth Once A Day 7)  Crestor 20 Mg Tabs (Rosuvastatin Calcium) .... Take 1 Tablet By Mouth Once A Day 8)  Cpap .Marland Kitchen.. 10 Ahc 9)  Co Q-10 200 Mg Caps (Coenzyme Q10) .... Take 1 Tablet By Mouth Once A Day 10)  Nateglinide 60 Mg Tabs (Nateglinide) .... One Tab At [facility] Before Meals As Needed 11)  Furosemide 20 Mg Tabs (Furosemide) .... 2  Tabs By Mouth Once Daily 12)  Pen Needles 1/2" 29g X 77mm Misc (Insulin Pen Needle) .... Use Once Daily As Directed 13)  Losartan Potassium 50 Mg Tabs (Losartan Potassium) .... One By Mouth Once Daily 14)  Humalog Mix 75/25 75-25 % Susp (Insulin Lispro Prot & Lispro) .... Inject 60 Units Subcutaneously Two Times A Day  With Meals As Directed  Allergies (verified): 1)  ! Sulfa 2)  ! Tetracycline 3)  ! Aspirin 4)  ! * Hormones 5)  ! Advair Diskus (Fluticasone-Salmeterol) 6)  ! Lisinopril 7)  ! * Latex 8)  Glucophage 9)  * Ivp Dye  Past History:  Past Medical History: Last updated: 09/17/2010 Current Problems:  HYPOTHYROIDISM (ICD-244.9)  HYPERCHOLESTEROLEMIA (ICD-272.0)   HYPERTENSION (ICD-401.9)   SYNCOPE AND COLLAPSE (ICD-780.2)       DIABETES MELLITUS (ICD-250.00)  DEPRESSION (ICD-311)   ASTHMA, CHILDHOOD (ICD-493.00)  - no problem since adulthood Allergic rhinitis obesity  panic attacks / anxiety Acute hypoxic respiratory failure secondary to multiple bilateral pulmonary embolism 09/2008 Negative hypercoagulable workup 09/2008 hospital  stay Severe obstructive sleep apnea    Social History: Last updated: 09/17/2010 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 Former Smoker Quit smoking 1991.  Smoked off and on x 20 yrs. Alcohol use-no   Drug use-no                 Review of Systems       The patient complains of dyspnea on exertion.  The patient denies anorexia, fever, weight loss, weight gain, vision loss, decreased hearing, hoarseness, chest pain, syncope, peripheral edema, prolonged cough, headaches, hemoptysis, abdominal pain, melena, hematochezia, severe indigestion/heartburn, hematuria, muscle weakness, suspicious skin lesions, difficulty walking, depression, unusual weight change, abnormal bleeding, enlarged lymph nodes, and angioedema.    Vital Signs:  Patient profile:   76 year old female Height:      62 inches Weight:      259 pounds BMI:     47.54 O2 Sat:      93 % on Room air Temp:     99 degrees F oral Pulse rate:   66 / minute BP sitting:   120 / 64  (left arm) Cuff size:   large  Vitals Entered By: Stefani Dama RCP, LPN (February  7, [id number] 11:42 AM)  O2 Flow:  Room air  Serial Vital Signs/Assessments:  Comments: Ambulatory Pulse Oximetry  Resting; HR__67___    02 Sat__94%RA___  Lap1 (185 feet)   HR__91___   02 Sat__90%RA___ Lap2 (185 feet)   HR__90___   02 Sat__89%RA___    Lap3 (185 feet)   HR_____   02 Sat_____  ___Test Completed without Difficulty _x__Test Stopped due to: pt's legs became tired pt O2-92%RA, P-81 at end of test. Iran Planas CMA  October 14, 2010 12:40 PM    By: Iran Planas CMA   CC: 3 week follow up, seen by T. Parrett for URI , finished antibiotics and nasal spray, no c/o denies sob, cough  Is Patient Diabetic? Yes Comments Medications reviewed with patient Stefani Dama RCP, LPN  February  7, [id number] 11:58 AM    Physical Exam  Additional Exam:  wt 252 July 04, 2010 >>253 09/22/10  Gen. Pleasant, well-nourished,  in no distress, normal affect ENT - max tenderness, red drainage.  Neck: No JVD, no thyromegaly, no carotid bruits Lungs: coarse BS w/ no wheezing  Cardiovascular: Rhythm regular, heart sounds  normal, no murmurs or gallops, 1+ peripheral edema Musculoskeletal: No deformities, no cyanosis or clubbing      Impression & Recommendations:  Problem # 1:  HYPOXEMIA (ICD-799.02) Likely on the basis of old PE OK to come off during daytime Chk nocturnal O2 on CPAP, to assess need.  Problem # 2:  SLEEP APNEA (ICD-780.57) Compliance encouraged, wt loss emphasized, asked to  avoid meds with sedative side effects, cautioned against driving when sleepy.  Orders: Est. Patient Level III (99213) Pulse Oximetry, Ambulatory [id number]) DME Referral (DME)  Problem # 3:  PULMONARY EMBOLISM (ICD-415.19) Observe off coumadin The following medications were removed from the medication list:    Coumadin 5 Mg Tabs (Warfarin sodium) .Marland Kitchen... As directed  Patient Instructions: 1)  Copy sent to: Dr Shawna Orleans 2)  Please schedule a follow-up appointment in 4 months. 3)  OK to stay off Oxygen daytime. 4)  Continue to use it at night with CPAP - we will check your oxygen level on CPAP 5)  Ask [facility] for options when you go to [place]: 2 TO 3 WEEK RETURN reviewed ONO 10/22/10 >> OK to come off O2 at night, ct to use CPAP - she is improving  Appended Document: 2 TO 3 WEEK RETURN Called, spoke with pt. She was informed of above statement per RA and she verbalized understanding of this.

## 2010-11-04 NOTE — Procedures (Signed)
Summary: Oxim[facility]  [facility]   Imported By: Bubba Hales 10/30/2010 07:47:12  _____________________________________________________________________  External Attachment:    Type:   Image     Comment:   External Document

## 2010-11-06 ENCOUNTER — Ambulatory Visit (INDEPENDENT_AMBULATORY_CARE_PROVIDER_SITE_OTHER): Payer: Medicare Other | Admitting: Internal Medicine

## 2010-11-06 ENCOUNTER — Encounter: Payer: Self-pay | Admitting: Internal Medicine

## 2010-11-06 DIAGNOSIS — J069 Acute upper respiratory infection, unspecified: Secondary | ICD-10-CM

## 2010-11-06 LAB — CONVERTED CEMR LAB

## 2010-11-10 ENCOUNTER — Telehealth: Payer: Self-pay | Admitting: Internal Medicine

## 2010-11-12 ENCOUNTER — Telehealth: Payer: Self-pay | Admitting: Internal Medicine

## 2010-11-18 NOTE — Progress Notes (Signed)
Summary: Blood Presssure Medicaton  Phone Note Call from Patient Call back at Home Phone [phone]   Caller: Patient Call For: D. Drema Pry DO Summary of Call: patient called and left voice message stating she spoke with the pharmacist regarding her cough , and was advised by the pharmacist the Losartan could be the cause of her symptoms, because it has potassium in it. She states that pharmacist advised for her to change the class of medication and see if that would help with patient symptoms. Patient states she in the past remembers having problems with a blood pressure medication that had potassium in it, but did not remember the name. Initial call taken by: Jiles Garter CMA,  November 12, 2010 10:07 AM  Follow-up for Phone Call        call placed to patient at [phone], she was informed per Dr Shawna Orleans to continue with medication as prescribed, and her symptoms may not have anything to do with blood pressure medication. Patient has verbalized understanding.  Follow-up by: Jiles Garter CMA,  November 12, 2010 3:39 PM

## 2010-11-18 NOTE — Progress Notes (Signed)
Summary: Productive Cough  Phone Note Call from Patient Call back at Home Phone [phone]   Caller: Patient Call For: D. Drema Pry DO Summary of Call: patient called and left voice message stating she was seen Friday and was not sure of the color of the Sputum  that was coming. She states that over the weekend she has green sputum  and would like to know if Dr Shawna Orleans would prescribe a antibiotic for her. Initial call taken by: Jiles Garter CMA,  November 10, 2010 10:20 AM  Follow-up for Phone Call        see rx for cefuroxime if symptoms gets worse,  pt needs OV Follow-up by: D. Drema Pry DO,  November 10, 2010 1:29 PM  Additional Follow-up for Phone Call Additional follow up Details #1::        call placed to patient at [phone], no answer. A detailed voice message was left informing patient per Dr Shawna Orleans instructions Additional Follow-up by: Jiles Garter CMA,  November 10, 2010 1:35 PM    New/Updated Medications: CEFUROXIME AXETIL 500 MG TABS (CEFUROXIME AXETIL) one by mouth two times a day Prescriptions: CEFUROXIME AXETIL 500 MG TABS (CEFUROXIME AXETIL) one by mouth two times a day  #20 x 0   Entered and Authorized by:   D. Drema Pry DO   Signed by:   D. Drema Pry DO on 11/10/2010   Method used:   Electronically to        [facility] # [phone]* (retail)       [address]       [place], [place]  [address]       Ph: [phone]       Fax: [phone]   [place]:   [phone]

## 2010-11-19 ENCOUNTER — Ambulatory Visit: Payer: Self-pay | Admitting: Internal Medicine

## 2010-11-20 LAB — BASIC METABOLIC PANEL
BUN: 13 mg/dL (ref 6–23)
BUN: 14 mg/dL (ref 6–23)
BUN: 14 mg/dL (ref 6–23)
BUN: 15 mg/dL (ref 6–23)
BUN: 19 mg/dL (ref 6–23)
BUN: 20 mg/dL (ref 6–23)
BUN: 22 mg/dL (ref 6–23)
BUN: 25 mg/dL — ABNORMAL HIGH (ref 6–23)
BUN: 26 mg/dL — ABNORMAL HIGH (ref 6–23)
BUN: 31 mg/dL — ABNORMAL HIGH (ref 6–23)
CO2: 25 mEq/L (ref 19–32)
CO2: 28 mEq/L (ref 19–32)
CO2: 28 mEq/L (ref 19–32)
CO2: 30 mEq/L (ref 19–32)
CO2: 32 mEq/L (ref 19–32)
CO2: 34 mEq/L — ABNORMAL HIGH (ref 19–32)
CO2: 34 mEq/L — ABNORMAL HIGH (ref 19–32)
CO2: 34 mEq/L — ABNORMAL HIGH (ref 19–32)
CO2: 36 mEq/L — ABNORMAL HIGH (ref 19–32)
CO2: 37 mEq/L — ABNORMAL HIGH (ref 19–32)
Calcium: 7.8 mg/dL — ABNORMAL LOW (ref 8.4–10.5)
Calcium: 7.9 mg/dL — ABNORMAL LOW (ref 8.4–10.5)
Calcium: 8.2 mg/dL — ABNORMAL LOW (ref 8.4–10.5)
Calcium: 8.3 mg/dL — ABNORMAL LOW (ref 8.4–10.5)
Calcium: 8.5 mg/dL (ref 8.4–10.5)
Calcium: 8.6 mg/dL (ref 8.4–10.5)
Calcium: 8.7 mg/dL (ref 8.4–10.5)
Calcium: 8.8 mg/dL (ref 8.4–10.5)
Calcium: 8.8 mg/dL (ref 8.4–10.5)
Calcium: 9.1 mg/dL (ref 8.4–10.5)
Chloride: 103 mEq/L (ref 96–112)
Chloride: 103 mEq/L (ref 96–112)
Chloride: 105 mEq/L (ref 96–112)
Chloride: 91 mEq/L — ABNORMAL LOW (ref 96–112)
Chloride: 93 mEq/L — ABNORMAL LOW (ref 96–112)
Chloride: 94 mEq/L — ABNORMAL LOW (ref 96–112)
Chloride: 94 mEq/L — ABNORMAL LOW (ref 96–112)
Chloride: 95 mEq/L — ABNORMAL LOW (ref 96–112)
Chloride: 99 mEq/L (ref 96–112)
Chloride: 99 mEq/L (ref 96–112)
Creatinine, Ser: 0.96 mg/dL (ref 0.4–1.2)
Creatinine, Ser: 1.02 mg/dL (ref 0.4–1.2)
Creatinine, Ser: 1.09 mg/dL (ref 0.4–1.2)
Creatinine, Ser: 1.1 mg/dL (ref 0.4–1.2)
Creatinine, Ser: 1.11 mg/dL (ref 0.4–1.2)
Creatinine, Ser: 1.15 mg/dL (ref 0.4–1.2)
Creatinine, Ser: 1.18 mg/dL (ref 0.4–1.2)
Creatinine, Ser: 1.23 mg/dL — ABNORMAL HIGH (ref 0.4–1.2)
Creatinine, Ser: 1.25 mg/dL — ABNORMAL HIGH (ref 0.4–1.2)
Creatinine, Ser: 1.38 mg/dL — ABNORMAL HIGH (ref 0.4–1.2)
GFR calc Af Amer: 45 mL/min — ABNORMAL LOW (ref >60)
GFR calc Af Amer: 51 mL/min — ABNORMAL LOW (ref >60)
GFR calc Af Amer: 52 mL/min — ABNORMAL LOW (ref >60)
GFR calc Af Amer: 54 mL/min — ABNORMAL LOW (ref >60)
GFR calc Af Amer: 56 mL/min — ABNORMAL LOW (ref >60)
GFR calc Af Amer: 58 mL/min — ABNORMAL LOW (ref >60)
GFR calc Af Amer: 59 mL/min — ABNORMAL LOW (ref >60)
GFR calc Af Amer: 60 mL/min — ABNORMAL LOW (ref >60)
GFR calc Af Amer: >60 mL/min (ref >60)
GFR calc Af Amer: >60 mL/min (ref >60)
GFR calc non Af Amer: 38 mL/min — ABNORMAL LOW (ref >60)
GFR calc non Af Amer: 42 mL/min — ABNORMAL LOW (ref >60)
GFR calc non Af Amer: 43 mL/min — ABNORMAL LOW (ref >60)
GFR calc non Af Amer: 45 mL/min — ABNORMAL LOW (ref >60)
GFR calc non Af Amer: 46 mL/min — ABNORMAL LOW (ref >60)
GFR calc non Af Amer: 48 mL/min — ABNORMAL LOW (ref >60)
GFR calc non Af Amer: 49 mL/min — ABNORMAL LOW (ref >60)
GFR calc non Af Amer: 49 mL/min — ABNORMAL LOW (ref >60)
GFR calc non Af Amer: 53 mL/min — ABNORMAL LOW (ref >60)
GFR calc non Af Amer: 57 mL/min — ABNORMAL LOW (ref >60)
Glucose, Bld: 115 mg/dL — ABNORMAL HIGH (ref 70–99)
Glucose, Bld: 135 mg/dL — ABNORMAL HIGH (ref 70–99)
Glucose, Bld: 157 mg/dL — ABNORMAL HIGH (ref 70–99)
Glucose, Bld: 169 mg/dL — ABNORMAL HIGH (ref 70–99)
Glucose, Bld: 169 mg/dL — ABNORMAL HIGH (ref 70–99)
Glucose, Bld: 177 mg/dL — ABNORMAL HIGH (ref 70–99)
Glucose, Bld: 178 mg/dL — ABNORMAL HIGH (ref 70–99)
Glucose, Bld: 186 mg/dL — ABNORMAL HIGH (ref 70–99)
Glucose, Bld: 192 mg/dL — ABNORMAL HIGH (ref 70–99)
Glucose, Bld: 301 mg/dL — ABNORMAL HIGH (ref 70–99)
Potassium: 3.3 mEq/L — ABNORMAL LOW (ref 3.5–5.1)
Potassium: 3.3 mEq/L — ABNORMAL LOW (ref 3.5–5.1)
Potassium: 3.3 mEq/L — ABNORMAL LOW (ref 3.5–5.1)
Potassium: 3.5 mEq/L (ref 3.5–5.1)
Potassium: 3.5 mEq/L (ref 3.5–5.1)
Potassium: 3.5 mEq/L (ref 3.5–5.1)
Potassium: 3.5 mEq/L (ref 3.5–5.1)
Potassium: 3.6 mEq/L (ref 3.5–5.1)
Potassium: 3.8 mEq/L (ref 3.5–5.1)
Potassium: 3.8 mEq/L (ref 3.5–5.1)
Sodium: 130 mEq/L — ABNORMAL LOW (ref 135–145)
Sodium: 138 mEq/L (ref 135–145)
Sodium: 138 mEq/L (ref 135–145)
Sodium: 139 mEq/L (ref 135–145)
Sodium: 139 mEq/L (ref 135–145)
Sodium: 139 mEq/L (ref 135–145)
Sodium: 139 mEq/L (ref 135–145)
Sodium: 140 mEq/L (ref 135–145)
Sodium: 140 mEq/L (ref 135–145)
Sodium: 140 mEq/L (ref 135–145)

## 2010-11-20 LAB — TSH: TSH: 1.703 u[IU]/mL (ref 0.350–4.500)

## 2010-11-20 LAB — GLUCOSE, CAPILLARY
Glucose-Capillary: 115 mg/dL — ABNORMAL HIGH (ref 70–99)
Glucose-Capillary: 116 mg/dL — ABNORMAL HIGH (ref 70–99)
Glucose-Capillary: 117 mg/dL — ABNORMAL HIGH (ref 70–99)
Glucose-Capillary: 127 mg/dL — ABNORMAL HIGH (ref 70–99)
Glucose-Capillary: 127 mg/dL — ABNORMAL HIGH (ref 70–99)
Glucose-Capillary: 138 mg/dL — ABNORMAL HIGH (ref 70–99)
Glucose-Capillary: 143 mg/dL — ABNORMAL HIGH (ref 70–99)
Glucose-Capillary: 146 mg/dL — ABNORMAL HIGH (ref 70–99)
Glucose-Capillary: 146 mg/dL — ABNORMAL HIGH (ref 70–99)
Glucose-Capillary: 148 mg/dL — ABNORMAL HIGH (ref 70–99)
Glucose-Capillary: 148 mg/dL — ABNORMAL HIGH (ref 70–99)
Glucose-Capillary: 158 mg/dL — ABNORMAL HIGH (ref 70–99)
Glucose-Capillary: 163 mg/dL — ABNORMAL HIGH (ref 70–99)
Glucose-Capillary: 166 mg/dL — ABNORMAL HIGH (ref 70–99)
Glucose-Capillary: 173 mg/dL — ABNORMAL HIGH (ref 70–99)
Glucose-Capillary: 174 mg/dL — ABNORMAL HIGH (ref 70–99)
Glucose-Capillary: 175 mg/dL — ABNORMAL HIGH (ref 70–99)
Glucose-Capillary: 178 mg/dL — ABNORMAL HIGH (ref 70–99)
Glucose-Capillary: 178 mg/dL — ABNORMAL HIGH (ref 70–99)
Glucose-Capillary: 182 mg/dL — ABNORMAL HIGH (ref 70–99)
Glucose-Capillary: 184 mg/dL — ABNORMAL HIGH (ref 70–99)
Glucose-Capillary: 184 mg/dL — ABNORMAL HIGH (ref 70–99)
Glucose-Capillary: 184 mg/dL — ABNORMAL HIGH (ref 70–99)
Glucose-Capillary: 184 mg/dL — ABNORMAL HIGH (ref 70–99)
Glucose-Capillary: 188 mg/dL — ABNORMAL HIGH (ref 70–99)
Glucose-Capillary: 191 mg/dL — ABNORMAL HIGH (ref 70–99)
Glucose-Capillary: 192 mg/dL — ABNORMAL HIGH (ref 70–99)
Glucose-Capillary: 199 mg/dL — ABNORMAL HIGH (ref 70–99)
Glucose-Capillary: 200 mg/dL — ABNORMAL HIGH (ref 70–99)
Glucose-Capillary: 205 mg/dL — ABNORMAL HIGH (ref 70–99)
Glucose-Capillary: 208 mg/dL — ABNORMAL HIGH (ref 70–99)
Glucose-Capillary: 211 mg/dL — ABNORMAL HIGH (ref 70–99)
Glucose-Capillary: 214 mg/dL — ABNORMAL HIGH (ref 70–99)
Glucose-Capillary: 228 mg/dL — ABNORMAL HIGH (ref 70–99)
Glucose-Capillary: 230 mg/dL — ABNORMAL HIGH (ref 70–99)
Glucose-Capillary: 232 mg/dL — ABNORMAL HIGH (ref 70–99)
Glucose-Capillary: 232 mg/dL — ABNORMAL HIGH (ref 70–99)
Glucose-Capillary: 242 mg/dL — ABNORMAL HIGH (ref 70–99)
Glucose-Capillary: 306 mg/dL — ABNORMAL HIGH (ref 70–99)
Glucose-Capillary: 327 mg/dL — ABNORMAL HIGH (ref 70–99)
Glucose-Capillary: 336 mg/dL — ABNORMAL HIGH (ref 70–99)
Glucose-Capillary: 399 mg/dL — ABNORMAL HIGH (ref 70–99)

## 2010-11-20 LAB — LEGIONELLA ANTIGEN, URINE: Legionella Antigen, Urine: NEGATIVE

## 2010-11-20 LAB — CBC
HCT: 26.6 % — ABNORMAL LOW (ref 36.0–46.0)
HCT: 26.9 % — ABNORMAL LOW (ref 36.0–46.0)
HCT: 27.9 % — ABNORMAL LOW (ref 36.0–46.0)
HCT: 28 % — ABNORMAL LOW (ref 36.0–46.0)
HCT: 28 % — ABNORMAL LOW (ref 36.0–46.0)
HCT: 28.3 % — ABNORMAL LOW (ref 36.0–46.0)
HCT: 28.5 % — ABNORMAL LOW (ref 36.0–46.0)
HCT: 28.8 % — ABNORMAL LOW (ref 36.0–46.0)
HCT: 29.3 % — ABNORMAL LOW (ref 36.0–46.0)
HCT: 29.3 % — ABNORMAL LOW (ref 36.0–46.0)
HCT: 29.8 % — ABNORMAL LOW (ref 36.0–46.0)
HCT: 31.3 % — ABNORMAL LOW (ref 36.0–46.0)
HCT: 31.7 % — ABNORMAL LOW (ref 36.0–46.0)
HCT: 31.8 % — ABNORMAL LOW (ref 36.0–46.0)
Hemoglobin: 10.2 g/dL — ABNORMAL LOW (ref 12.0–15.0)
Hemoglobin: 10.3 g/dL — ABNORMAL LOW (ref 12.0–15.0)
Hemoglobin: 10.5 g/dL — ABNORMAL LOW (ref 12.0–15.0)
Hemoglobin: 8.6 g/dL — ABNORMAL LOW (ref 12.0–15.0)
Hemoglobin: 8.6 g/dL — ABNORMAL LOW (ref 12.0–15.0)
Hemoglobin: 8.9 g/dL — ABNORMAL LOW (ref 12.0–15.0)
Hemoglobin: 9 g/dL — ABNORMAL LOW (ref 12.0–15.0)
Hemoglobin: 9 g/dL — ABNORMAL LOW (ref 12.0–15.0)
Hemoglobin: 9.1 g/dL — ABNORMAL LOW (ref 12.0–15.0)
Hemoglobin: 9.3 g/dL — ABNORMAL LOW (ref 12.0–15.0)
Hemoglobin: 9.3 g/dL — ABNORMAL LOW (ref 12.0–15.0)
Hemoglobin: 9.4 g/dL — ABNORMAL LOW (ref 12.0–15.0)
Hemoglobin: 9.6 g/dL — ABNORMAL LOW (ref 12.0–15.0)
Hemoglobin: 9.7 g/dL — ABNORMAL LOW (ref 12.0–15.0)
MCH: 29.2 pg (ref 26.0–34.0)
MCH: 29.4 pg (ref 26.0–34.0)
MCH: 29.5 pg (ref 26.0–34.0)
MCH: 29.7 pg (ref 26.0–34.0)
MCH: 29.7 pg (ref 26.0–34.0)
MCH: 29.8 pg (ref 26.0–34.0)
MCH: 29.8 pg (ref 26.0–34.0)
MCH: 30 pg (ref 26.0–34.0)
MCH: 30 pg (ref 26.0–34.0)
MCH: 30 pg (ref 26.0–34.0)
MCH: 30.2 pg (ref 26.0–34.0)
MCH: 30.2 pg (ref 26.0–34.0)
MCH: 30.3 pg (ref 26.0–34.0)
MCH: 30.3 pg (ref 26.0–34.0)
MCHC: 31.8 g/dL (ref 30.0–36.0)
MCHC: 31.8 g/dL (ref 30.0–36.0)
MCHC: 32 g/dL (ref 30.0–36.0)
MCHC: 32.1 g/dL (ref 30.0–36.0)
MCHC: 32.1 g/dL (ref 30.0–36.0)
MCHC: 32.2 g/dL (ref 30.0–36.0)
MCHC: 32.3 g/dL (ref 30.0–36.0)
MCHC: 32.3 g/dL (ref 30.0–36.0)
MCHC: 32.5 g/dL (ref 30.0–36.0)
MCHC: 32.6 g/dL (ref 30.0–36.0)
MCHC: 32.6 g/dL (ref 30.0–36.0)
MCHC: 32.6 g/dL (ref 30.0–36.0)
MCHC: 33 g/dL (ref 30.0–36.0)
MCHC: 33.1 g/dL (ref 30.0–36.0)
MCV: 91.1 fL (ref 78.0–100.0)
MCV: 91.1 fL (ref 78.0–100.0)
MCV: 91.2 fL (ref 78.0–100.0)
MCV: 91.4 fL (ref 78.0–100.0)
MCV: 91.6 fL (ref 78.0–100.0)
MCV: 92.1 fL (ref 78.0–100.0)
MCV: 92.3 fL (ref 78.0–100.0)
MCV: 92.4 fL (ref 78.0–100.0)
MCV: 92.4 fL (ref 78.0–100.0)
MCV: 92.9 fL (ref 78.0–100.0)
MCV: 93 fL (ref 78.0–100.0)
MCV: 93.3 fL (ref 78.0–100.0)
MCV: 93.5 fL (ref 78.0–100.0)
MCV: 93.7 fL (ref 78.0–100.0)
Platelets: 176 10*3/uL (ref 150–400)
Platelets: 192 10*3/uL (ref 150–400)
Platelets: 209 10*3/uL (ref 150–400)
Platelets: 210 10*3/uL (ref 150–400)
Platelets: 214 10*3/uL (ref 150–400)
Platelets: 216 10*3/uL (ref 150–400)
Platelets: 220 10*3/uL (ref 150–400)
Platelets: 222 10*3/uL (ref 150–400)
Platelets: 242 10*3/uL (ref 150–400)
Platelets: 243 10*3/uL (ref 150–400)
Platelets: 251 10*3/uL (ref 150–400)
Platelets: 252 10*3/uL (ref 150–400)
Platelets: 256 10*3/uL (ref 150–400)
Platelets: 263 10*3/uL (ref 150–400)
RBC: 2.92 MIL/uL — ABNORMAL LOW (ref 3.87–5.11)
RBC: 2.95 MIL/uL — ABNORMAL LOW (ref 3.87–5.11)
RBC: 3 MIL/uL — ABNORMAL LOW (ref 3.87–5.11)
RBC: 3.02 MIL/uL — ABNORMAL LOW (ref 3.87–5.11)
RBC: 3.03 MIL/uL — ABNORMAL LOW (ref 3.87–5.11)
RBC: 3.03 MIL/uL — ABNORMAL LOW (ref 3.87–5.11)
RBC: 3.08 MIL/uL — ABNORMAL LOW (ref 3.87–5.11)
RBC: 3.13 MIL/uL — ABNORMAL LOW (ref 3.87–5.11)
RBC: 3.15 MIL/uL — ABNORMAL LOW (ref 3.87–5.11)
RBC: 3.2 MIL/uL — ABNORMAL LOW (ref 3.87–5.11)
RBC: 3.23 MIL/uL — ABNORMAL LOW (ref 3.87–5.11)
RBC: 3.37 MIL/uL — ABNORMAL LOW (ref 3.87–5.11)
RBC: 3.43 MIL/uL — ABNORMAL LOW (ref 3.87–5.11)
RBC: 3.48 MIL/uL — ABNORMAL LOW (ref 3.87–5.11)
RDW: 17.1 % — ABNORMAL HIGH (ref 11.5–15.5)
RDW: 17.2 % — ABNORMAL HIGH (ref 11.5–15.5)
RDW: 17.3 % — ABNORMAL HIGH (ref 11.5–15.5)
RDW: 17.4 % — ABNORMAL HIGH (ref 11.5–15.5)
RDW: 17.4 % — ABNORMAL HIGH (ref 11.5–15.5)
RDW: 17.4 % — ABNORMAL HIGH (ref 11.5–15.5)
RDW: 17.4 % — ABNORMAL HIGH (ref 11.5–15.5)
RDW: 17.5 % — ABNORMAL HIGH (ref 11.5–15.5)
RDW: 17.5 % — ABNORMAL HIGH (ref 11.5–15.5)
RDW: 17.5 % — ABNORMAL HIGH (ref 11.5–15.5)
RDW: 17.6 % — ABNORMAL HIGH (ref 11.5–15.5)
RDW: 17.6 % — ABNORMAL HIGH (ref 11.5–15.5)
RDW: 17.7 % — ABNORMAL HIGH (ref 11.5–15.5)
RDW: 17.7 % — ABNORMAL HIGH (ref 11.5–15.5)
WBC: 10.1 10*3/uL (ref 4.0–10.5)
WBC: 7.2 10*3/uL (ref 4.0–10.5)
WBC: 7.2 10*3/uL (ref 4.0–10.5)
WBC: 7.3 10*3/uL (ref 4.0–10.5)
WBC: 7.6 10*3/uL (ref 4.0–10.5)
WBC: 7.7 10*3/uL (ref 4.0–10.5)
WBC: 7.9 10*3/uL (ref 4.0–10.5)
WBC: 7.9 10*3/uL (ref 4.0–10.5)
WBC: 8 10*3/uL (ref 4.0–10.5)
WBC: 8.8 10*3/uL (ref 4.0–10.5)
WBC: 9.1 10*3/uL (ref 4.0–10.5)
WBC: 9.1 10*3/uL (ref 4.0–10.5)
WBC: 9.3 10*3/uL (ref 4.0–10.5)
WBC: 9.5 10*3/uL (ref 4.0–10.5)

## 2010-11-20 LAB — TYPE AND SCREEN
ABO/RH(D): A NEG
Antibody Screen: NEGATIVE

## 2010-11-20 LAB — URINALYSIS, ROUTINE W REFLEX MICROSCOPIC
Bilirubin Urine: NEGATIVE
Glucose, UA: 500 mg/dL — AB
Ketones, ur: NEGATIVE mg/dL
Nitrite: POSITIVE — AB
Protein, ur: 100 mg/dL — AB
Specific Gravity, Urine: 1.024 (ref 1.005–1.030)
Urobilinogen, UA: 1 mg/dL (ref 0.0–1.0)
pH: 5.5 (ref 5.0–8.0)

## 2010-11-20 LAB — BRAIN NATRIURETIC PEPTIDE
Pro B Natriuretic peptide (BNP): 182 pg/mL — ABNORMAL HIGH (ref 0.0–100.0)
Pro B Natriuretic peptide (BNP): 82 pg/mL (ref 0.0–100.0)

## 2010-11-20 LAB — CULTURE, BLOOD (ROUTINE X 2)
Culture  Setup Time: 201110022052
Culture  Setup Time: 201110022052
Culture: NO GROWTH
Culture: NO GROWTH

## 2010-11-20 LAB — COMPREHENSIVE METABOLIC PANEL
ALT: 19 U/L (ref 0–35)
AST: 18 U/L (ref 0–37)
Albumin: 2.5 g/dL — ABNORMAL LOW (ref 3.5–5.2)
Alkaline Phosphatase: 57 U/L (ref 39–117)
BUN: 23 mg/dL (ref 6–23)
CO2: 27 mEq/L (ref 19–32)
Calcium: 7.8 mg/dL — ABNORMAL LOW (ref 8.4–10.5)
Chloride: 100 mEq/L (ref 96–112)
Creatinine, Ser: 1.27 mg/dL — ABNORMAL HIGH (ref 0.4–1.2)
GFR calc Af Amer: 50 mL/min — ABNORMAL LOW (ref >60)
GFR calc non Af Amer: 41 mL/min — ABNORMAL LOW (ref >60)
Glucose, Bld: 455 mg/dL — ABNORMAL HIGH (ref 70–99)
Potassium: 4.1 mEq/L (ref 3.5–5.1)
Sodium: 136 mEq/L (ref 135–145)
Total Bilirubin: 0.5 mg/dL (ref 0.3–1.2)
Total Protein: 6.7 g/dL (ref 6.0–8.3)

## 2010-11-20 LAB — PROTIME-INR
INR: 1.66 — ABNORMAL HIGH (ref 0.00–1.49)
INR: 1.69 — ABNORMAL HIGH (ref 0.00–1.49)
INR: 1.77 — ABNORMAL HIGH (ref 0.00–1.49)
INR: 1.83 — ABNORMAL HIGH (ref 0.00–1.49)
INR: 1.84 — ABNORMAL HIGH (ref 0.00–1.49)
INR: 2.06 — ABNORMAL HIGH (ref 0.00–1.49)
INR: 2.2 — ABNORMAL HIGH (ref 0.00–1.49)
INR: 2.49 — ABNORMAL HIGH (ref 0.00–1.49)
INR: 2.86 — ABNORMAL HIGH (ref 0.00–1.49)
INR: 2.94 — ABNORMAL HIGH (ref 0.00–1.49)
INR: 3.46 — ABNORMAL HIGH (ref 0.00–1.49)
Prothrombin Time: 19.8 seconds — ABNORMAL HIGH (ref 11.6–15.2)
Prothrombin Time: 20.1 seconds — ABNORMAL HIGH (ref 11.6–15.2)
Prothrombin Time: 20.8 seconds — ABNORMAL HIGH (ref 11.6–15.2)
Prothrombin Time: 21.3 seconds — ABNORMAL HIGH (ref 11.6–15.2)
Prothrombin Time: 21.4 seconds — ABNORMAL HIGH (ref 11.6–15.2)
Prothrombin Time: 23.4 seconds — ABNORMAL HIGH (ref 11.6–15.2)
Prothrombin Time: 24.6 seconds — ABNORMAL HIGH (ref 11.6–15.2)
Prothrombin Time: 27 seconds — ABNORMAL HIGH (ref 11.6–15.2)
Prothrombin Time: 30.1 seconds — ABNORMAL HIGH (ref 11.6–15.2)
Prothrombin Time: 30.7 seconds — ABNORMAL HIGH (ref 11.6–15.2)
Prothrombin Time: 34.8 seconds — ABNORMAL HIGH (ref 11.6–15.2)

## 2010-11-20 LAB — DIFFERENTIAL
Basophils Absolute: 0 10*3/uL (ref 0.0–0.1)
Basophils Relative: 0 % (ref 0–1)
Eosinophils Absolute: 0 10*3/uL (ref 0.0–0.7)
Eosinophils Relative: 0 % (ref 0–5)
Lymphocytes Relative: 7 % — ABNORMAL LOW (ref 12–46)
Lymphs Abs: 0.6 10*3/uL — ABNORMAL LOW (ref 0.7–4.0)
Monocytes Absolute: 0.5 10*3/uL (ref 0.1–1.0)
Monocytes Relative: 6 % (ref 3–12)
Neutro Abs: 8 10*3/uL — ABNORMAL HIGH (ref 1.7–7.7)
Neutrophils Relative %: 88 % — ABNORMAL HIGH (ref 43–77)

## 2010-11-20 LAB — HEPATIC FUNCTION PANEL
ALT: 18 U/L (ref 0–35)
AST: 21 U/L (ref 0–37)
Albumin: 2.9 g/dL — ABNORMAL LOW (ref 3.5–5.2)
Alkaline Phosphatase: 54 U/L (ref 39–117)
Bilirubin, Direct: 0.3 mg/dL (ref 0.0–0.3)
Indirect Bilirubin: 0.6 mg/dL (ref 0.3–0.9)
Total Bilirubin: 0.9 mg/dL (ref 0.3–1.2)
Total Protein: 7 g/dL (ref 6.0–8.3)

## 2010-11-20 LAB — URINE CULTURE
Colony Count: >=100000
Culture  Setup Time: 201110022055

## 2010-11-20 LAB — LACTIC ACID, PLASMA
Lactic Acid, Venous: 0.9 mmol/L (ref 0.5–2.2)
Lactic Acid, Venous: 2.5 mmol/L — ABNORMAL HIGH (ref 0.5–2.2)

## 2010-11-20 LAB — STREP PNEUMONIAE URINARY ANTIGEN: Strep Pneumo Urinary Antigen: NEGATIVE

## 2010-11-20 LAB — HEMOGLOBIN A1C
Hgb A1c MFr Bld: 9.7 % — ABNORMAL HIGH (ref <5.7)
Mean Plasma Glucose: 232 mg/dL — ABNORMAL HIGH (ref <117)

## 2010-11-20 LAB — URINE MICROSCOPIC-ADD ON

## 2010-11-20 LAB — INFLUENZA PANEL BY PCR (TYPE A & B)
H1N1 flu by pcr: NOT DETECTED
Influenza A By PCR: NEGATIVE
Influenza B By PCR: NEGATIVE

## 2010-11-20 LAB — SEDIMENTATION RATE
Sed Rate: 138 mm/hr — ABNORMAL HIGH (ref 0–22)
Sed Rate: 138 mm/hr — ABNORMAL HIGH (ref 0–22)

## 2010-11-20 LAB — ABO/RH: ABO/RH(D): A NEG

## 2010-11-20 LAB — MRSA PCR SCREENING: MRSA by PCR: NEGATIVE

## 2010-11-20 LAB — PROCALCITONIN
Procalcitonin: 0.46 ng/mL
Procalcitonin: 0.53 ng/mL

## 2010-11-20 LAB — APTT: aPTT: 52 seconds — ABNORMAL HIGH (ref 24–37)

## 2010-11-20 LAB — IRON AND TIBC
Iron: 33 ug/dL — ABNORMAL LOW (ref 42–135)
Saturation Ratios: 13 % — ABNORMAL LOW (ref 20–55)
TIBC: 253 ug/dL (ref 250–470)
UIBC: 220 ug/dL

## 2010-11-20 LAB — HEMOCCULT GUIAC POC 1CARD (OFFICE): Fecal Occult Bld: POSITIVE

## 2010-11-25 NOTE — Assessment & Plan Note (Signed)
Summary: sick/ss   Vital Signs:  Patient profile:   76 year old female Height:      62 inches Weight:      252.25 pounds BMI:     46.30 O2 Sat:      97 % on Room air Temp:     97.8 degrees F oral Pulse rate:   78 / minute Resp:     22 per minute BP sitting:   152 / 70  (right arm) Cuff size:   large  Vitals Entered By: Jiles Garter CMA (November 06, 2010 2:22 PM)  O2 Flow:  Room air CC: Throat irritation Is Patient Diabetic? Yes Pain Assessment Patient in pain? no      Comments c/o throat irritation, congestion, onset Tuesday, productive cough unknown color, onset this am, mucinex dm with some help, sinus headache     Last PAP Result Declined    Primary Care Provider:  Jennings Books DO  CC:  Throat irritation.  History of Present Illness:  76 y/o female c/o scratchy throat tried some mucinex DM non productive cough does not feel SOB onset 2 days  Dr. Elsworth Soho took her off oxygen   Preventive Screening-Counseling & Management  Alcohol-Tobacco     Smoking Status: quit  Allergies: 1)  ! Sulfa 2)  ! Tetracycline 3)  ! Aspirin 4)  ! * Hormones 5)  ! Advair Diskus (Fluticasone-Salmeterol) 6)  ! Lisinopril 7)  ! * Latex 8)  Glucophage 9)  * Ivp Dye  Physical Exam  General:  alert and overweight-appearing.   Ears:  R ear normal and L ear normal.   Mouth:  pharyngeal erythema.   Neck:  No deformities, masses, or tenderness noted. Lungs:  normal respiratory effort, normal breath sounds, no crackles, and no wheezes.   Heart:  normal rate, regular rhythm, and no gallop.     Impression & Recommendations:  Problem # 1:  URI (ICD-465.9)  Her updated medication list for this problem includes:    Fexofenadine Hcl 180 Mg Tabs (Fexofenadine hcl) .Marland Kitchen... Take 1 tablet by mouth once a day  Instructed on symptomatic treatment. Call if symptoms persist or worsen.   Complete Medication List: 1)  Synthroid 100 Mcg Tabs (Levothyroxine sodium) .... Take 1 tablet by  mouth once a day 2)  Fexofenadine Hcl 180 Mg Tabs (Fexofenadine hcl) .... Take 1 tablet by mouth once a day 3)  Sertraline Hcl 100 Mg Tabs (Sertraline hcl) .... One by mouth once daily 4)  Zantac 150 Maximum Strength 150 Mg Tabs (Ranitidine hcl) .... One tab by mouth once daily as needed 5)  Multivitamins Tabs (Multiple vitamin) .... One by mouth once daily 6)  Vitamin D 2000 Unit Caps (Cholecalciferol) .... Take 1 capsule by mouth once a day 7)  Crestor 20 Mg Tabs (Rosuvastatin calcium) .... Take 1 tablet by mouth once a day 8)  Cpap  .Marland Kitchen.. 10 ahc 9)  Co Q-10 200 Mg Caps (Coenzyme q10) .... Take 1 tablet by mouth once a day 10)  Nateglinide 60 Mg Tabs (Nateglinide) .... One tab at noon before meals as needed 11)  Furosemide 20 Mg Tabs (Furosemide) .... 2  tabs by mouth once daily 12)  Pen Needles 1/2" 29g X 54mm Misc (Insulin pen needle) .... Use once daily as directed 13)  Losartan Potassium 50 Mg Tabs (Losartan potassium) .... One by mouth once daily 14)  Humalog Mix 75/25 75-25 % Susp (Insulin lispro prot & lispro) .... Inject 60  units subcutaneously  every morning and 70 untis every evening 15)  Fluticasone Propionate 50 Mcg/act Susp (Fluticasone propionate) .... 2 sprays each nostril qd 16)  Cefuroxime Axetil 500 Mg Tabs (Cefuroxime axetil) .... One by mouth two times a day  Patient Instructions: 1)  Call our office if your symptoms do not  improve or gets worse. Prescriptions: FLUTICASONE PROPIONATE 50 MCG/ACT SUSP (FLUTICASONE PROPIONATE) 2 sprays each nostril qd  #1 x 3   Entered and Authorized by:   D. Drema Pry DO   Signed by:   D. Drema Pry DO on 11/06/2010   Method used:   Electronically to        [facility] [address] # [phone]* (retail)       [address]       [place], [place]  [address]       Ph: [phone]       Fax: [phone]   RxID:   [id number]    Orders Added: 1)  Est. Patient Level III [id number]     Preventive Care Screening  Pap Smear:    Date:   11/06/2010    Results:  Declined    Current Allergies (reviewed today): ! SULFA ! TETRACYCLINE ! ASPIRIN ! * HORMONES ! ADVAIR DISKUS (FLUTICASONE-SALMETEROL) ! LISINOPRIL ! * LATEX GLUCOPHAGE * IVP DYE

## 2010-11-25 NOTE — Letter (Signed)
Summary: [facility]   Imported By: Laural Benes 11/17/2010 11:25:49  _____________________________________________________________________  External Attachment:    Type:   Image     Comment:   External Document

## 2010-12-22 LAB — GLUCOSE, CAPILLARY
Glucose-Capillary: 128 mg/dL — ABNORMAL HIGH (ref 70–99)
Glucose-Capillary: 133 mg/dL — ABNORMAL HIGH (ref 70–99)
Glucose-Capillary: 135 mg/dL — ABNORMAL HIGH (ref 70–99)
Glucose-Capillary: 138 mg/dL — ABNORMAL HIGH (ref 70–99)
Glucose-Capillary: 146 mg/dL — ABNORMAL HIGH (ref 70–99)
Glucose-Capillary: 146 mg/dL — ABNORMAL HIGH (ref 70–99)
Glucose-Capillary: 148 mg/dL — ABNORMAL HIGH (ref 70–99)
Glucose-Capillary: 148 mg/dL — ABNORMAL HIGH (ref 70–99)
Glucose-Capillary: 149 mg/dL — ABNORMAL HIGH (ref 70–99)
Glucose-Capillary: 151 mg/dL — ABNORMAL HIGH (ref 70–99)
Glucose-Capillary: 154 mg/dL — ABNORMAL HIGH (ref 70–99)
Glucose-Capillary: 157 mg/dL — ABNORMAL HIGH (ref 70–99)
Glucose-Capillary: 160 mg/dL — ABNORMAL HIGH (ref 70–99)
Glucose-Capillary: 162 mg/dL — ABNORMAL HIGH (ref 70–99)
Glucose-Capillary: 162 mg/dL — ABNORMAL HIGH (ref 70–99)
Glucose-Capillary: 162 mg/dL — ABNORMAL HIGH (ref 70–99)
Glucose-Capillary: 163 mg/dL — ABNORMAL HIGH (ref 70–99)
Glucose-Capillary: 163 mg/dL — ABNORMAL HIGH (ref 70–99)
Glucose-Capillary: 163 mg/dL — ABNORMAL HIGH (ref 70–99)
Glucose-Capillary: 165 mg/dL — ABNORMAL HIGH (ref 70–99)
Glucose-Capillary: 165 mg/dL — ABNORMAL HIGH (ref 70–99)
Glucose-Capillary: 166 mg/dL — ABNORMAL HIGH (ref 70–99)
Glucose-Capillary: 168 mg/dL — ABNORMAL HIGH (ref 70–99)
Glucose-Capillary: 168 mg/dL — ABNORMAL HIGH (ref 70–99)
Glucose-Capillary: 169 mg/dL — ABNORMAL HIGH (ref 70–99)
Glucose-Capillary: 170 mg/dL — ABNORMAL HIGH (ref 70–99)
Glucose-Capillary: 174 mg/dL — ABNORMAL HIGH (ref 70–99)
Glucose-Capillary: 179 mg/dL — ABNORMAL HIGH (ref 70–99)
Glucose-Capillary: 180 mg/dL — ABNORMAL HIGH (ref 70–99)
Glucose-Capillary: 186 mg/dL — ABNORMAL HIGH (ref 70–99)
Glucose-Capillary: 188 mg/dL — ABNORMAL HIGH (ref 70–99)
Glucose-Capillary: 198 mg/dL — ABNORMAL HIGH (ref 70–99)
Glucose-Capillary: 200 mg/dL — ABNORMAL HIGH (ref 70–99)

## 2010-12-22 LAB — POCT CARDIAC MARKERS
CKMB, poc: 5.4 ng/mL (ref 1.0–8.0)
Myoglobin, poc: 89.7 ng/mL (ref 12–200)
Troponin i, poc: 0.31 ng/mL (ref 0.00–0.09)

## 2010-12-22 LAB — LUPUS ANTICOAGULANT PANEL
DRVVT: 50.5 secs — ABNORMAL HIGH (ref 36.1–47.0)
Lupus Anticoagulant: NOT DETECTED
PTT Lupus Anticoagulant: 161.7 secs — ABNORMAL HIGH (ref 36.3–48.8)
PTTLA 4:1 Mix: 122 secs — ABNORMAL HIGH (ref 36.3–48.8)
PTTLA Confirmation: 7 secs (ref <8.0)
dRVVT Incubated 1:1 Mix: 42.2 secs (ref 36.1–47.0)

## 2010-12-22 LAB — CBC
HCT: 36.1 % (ref 36.0–46.0)
HCT: 43.1 % (ref 36.0–46.0)
Hemoglobin: 12.1 g/dL (ref 12.0–15.0)
Hemoglobin: 14.1 g/dL (ref 12.0–15.0)
MCHC: 32.8 g/dL (ref 30.0–36.0)
MCHC: 33.5 g/dL (ref 30.0–36.0)
MCV: 94.7 fL (ref 78.0–100.0)
MCV: 95.1 fL (ref 78.0–100.0)
Platelets: 217 10*3/uL (ref 150–400)
Platelets: 271 10*3/uL (ref 150–400)
RBC: 3.81 MIL/uL — ABNORMAL LOW (ref 3.87–5.11)
RBC: 4.53 MIL/uL (ref 3.87–5.11)
RDW: 15.4 % (ref 11.5–15.5)
RDW: 16.4 % — ABNORMAL HIGH (ref 11.5–15.5)
WBC: 11.7 10*3/uL — ABNORMAL HIGH (ref 4.0–10.5)
WBC: 9.1 10*3/uL (ref 4.0–10.5)

## 2010-12-22 LAB — HEPATIC FUNCTION PANEL
ALT: 18 U/L (ref 0–35)
AST: 18 U/L (ref 0–37)
Albumin: 3.1 g/dL — ABNORMAL LOW (ref 3.5–5.2)
Alkaline Phosphatase: 62 U/L (ref 39–117)
Bilirubin, Direct: 0.2 mg/dL (ref 0.0–0.3)
Indirect Bilirubin: 0.4 mg/dL (ref 0.3–0.9)
Total Bilirubin: 0.6 mg/dL (ref 0.3–1.2)
Total Protein: 7.1 g/dL (ref 6.0–8.3)

## 2010-12-22 LAB — BASIC METABOLIC PANEL
BUN: 24 mg/dL — ABNORMAL HIGH (ref 6–23)
BUN: 28 mg/dL — ABNORMAL HIGH (ref 6–23)
CO2: 24 mEq/L (ref 19–32)
CO2: 27 mEq/L (ref 19–32)
Calcium: 8.7 mg/dL (ref 8.4–10.5)
Calcium: 9.8 mg/dL (ref 8.4–10.5)
Chloride: 98 mEq/L (ref 96–112)
Chloride: 99 mEq/L (ref 96–112)
Creatinine, Ser: 1.2 mg/dL (ref 0.4–1.2)
Creatinine, Ser: 1.2 mg/dL (ref 0.4–1.2)
GFR calc Af Amer: 54 mL/min — ABNORMAL LOW (ref >60)
GFR calc Af Amer: 54 mL/min — ABNORMAL LOW (ref >60)
GFR calc non Af Amer: 44 mL/min — ABNORMAL LOW (ref >60)
GFR calc non Af Amer: 44 mL/min — ABNORMAL LOW (ref >60)
Glucose, Bld: 182 mg/dL — ABNORMAL HIGH (ref 70–99)
Glucose, Bld: 260 mg/dL — ABNORMAL HIGH (ref 70–99)
Potassium: 4.5 mEq/L (ref 3.5–5.1)
Potassium: 4.9 mEq/L (ref 3.5–5.1)
Sodium: 134 mEq/L — ABNORMAL LOW (ref 135–145)
Sodium: 137 mEq/L (ref 135–145)

## 2010-12-22 LAB — CARDIOLIPIN ANTIBODIES, IGG, IGM, IGA
Anticardiolipin IgA: 8 [APL'U] — ABNORMAL LOW (ref <13)
Anticardiolipin IgG: <7 [GPL'U] — ABNORMAL LOW (ref <11)
Anticardiolipin IgM: 7 [MPL'U] — ABNORMAL LOW (ref <10)

## 2010-12-22 LAB — CARDIAC PANEL(CRET KIN+CKTOT+MB+TROPI)
CK, MB: 5.1 ng/mL — ABNORMAL HIGH (ref 0.3–4.0)
CK, MB: 5.7 ng/mL — ABNORMAL HIGH (ref 0.3–4.0)
Relative Index: INVALID (ref 0.0–2.5)
Relative Index: INVALID (ref 0.0–2.5)
Total CK: 81 U/L (ref 7–177)
Total CK: 86 U/L (ref 7–177)
Troponin I: 0.46 ng/mL — ABNORMAL HIGH (ref 0.00–0.06)
Troponin I: 0.68 ng/mL (ref 0.00–0.06)

## 2010-12-22 LAB — FACTOR 5 LEIDEN

## 2010-12-22 LAB — LIPID PANEL
Cholesterol: 256 mg/dL — ABNORMAL HIGH (ref 0–200)
HDL: 33 mg/dL — ABNORMAL LOW (ref >39)
LDL Cholesterol: 163 mg/dL — ABNORMAL HIGH (ref 0–99)
Total CHOL/HDL Ratio: 7.8 RATIO
Triglycerides: 302 mg/dL — ABNORMAL HIGH (ref <150)
VLDL: 60 mg/dL — ABNORMAL HIGH (ref 0–40)

## 2010-12-22 LAB — POCT B-TYPE NATRIURETIC PEPTIDE (BNP): B Natriuretic Peptide, POC: 934 pg/mL — ABNORMAL HIGH (ref 0–100)

## 2010-12-22 LAB — BETA-2-GLYCOPROTEIN I ABS, IGG/M/A
Beta-2 Glyco I IgG: <4 U/mL (ref <20)
Beta-2-Glycoprotein I IgA: <4 U/mL (ref <10)
Beta-2-Glycoprotein I IgM: <4 U/mL (ref <10)

## 2010-12-22 LAB — DIFFERENTIAL
Basophils Absolute: 0.1 10*3/uL (ref 0.0–0.1)
Basophils Relative: 1 % (ref 0–1)
Eosinophils Absolute: 0 10*3/uL (ref 0.0–0.7)
Eosinophils Relative: 0 % (ref 0–5)
Lymphocytes Relative: 16 % (ref 12–46)
Lymphs Abs: 1.9 10*3/uL (ref 0.7–4.0)
Monocytes Absolute: 0.6 10*3/uL (ref 0.1–1.0)
Monocytes Relative: 6 % (ref 3–12)
Neutro Abs: 9.1 10*3/uL — ABNORMAL HIGH (ref 1.7–7.7)
Neutrophils Relative %: 77 % (ref 43–77)

## 2010-12-22 LAB — PROTHROMBIN GENE MUTATION

## 2010-12-22 LAB — HEPARIN LEVEL (UNFRACTIONATED)
Heparin Unfractionated: 0.28 IU/mL — ABNORMAL LOW (ref 0.30–0.70)
Heparin Unfractionated: 0.94 IU/mL — ABNORMAL HIGH (ref 0.30–0.70)

## 2010-12-22 LAB — PROTIME-INR
INR: 1.1 (ref 0.00–1.49)
INR: 1.3 (ref 0.00–1.49)
INR: 1.9 — ABNORMAL HIGH (ref 0.00–1.49)
INR: 3.1 — ABNORMAL HIGH (ref 0.00–1.49)
Prothrombin Time: 14.9 seconds (ref 11.6–15.2)
Prothrombin Time: 16.4 seconds — ABNORMAL HIGH (ref 11.6–15.2)
Prothrombin Time: 22.7 seconds — ABNORMAL HIGH (ref 11.6–15.2)
Prothrombin Time: 34.4 seconds — ABNORMAL HIGH (ref 11.6–15.2)

## 2010-12-22 LAB — APTT: aPTT: >200 seconds (ref 24–37)

## 2010-12-22 LAB — PROTEIN S, TOTAL: Protein S Ag, Total: 136 % (ref 70–140)

## 2010-12-22 LAB — ANTITHROMBIN III: AntiThromb III Func: 108 % (ref 76–126)

## 2010-12-22 LAB — PROTEIN C ACTIVITY: Protein C Activity: 179 % — ABNORMAL HIGH (ref 75–133)

## 2010-12-22 LAB — PROTEIN S ACTIVITY: Protein S Activity: 109 % (ref 69–129)

## 2010-12-22 LAB — HEMOGLOBIN A1C
Hgb A1c MFr Bld: 7.1 % — ABNORMAL HIGH (ref 4.6–6.1)
Mean Plasma Glucose: 157 mg/dL

## 2010-12-22 LAB — D-DIMER, QUANTITATIVE: D-Dimer, Quant: 6 ug/mL-FEU — ABNORMAL HIGH (ref 0.00–0.48)

## 2010-12-22 LAB — PROTEIN C, TOTAL: Protein C, Total: 108 % (ref 70–140)

## 2010-12-22 LAB — HOMOCYSTEINE: Homocysteine: 8.4 umol/L (ref 4.0–15.4)

## 2010-12-22 LAB — ALT: ALT: 24 U/L (ref 0–35)

## 2010-12-22 LAB — TSH: TSH: 2.756 u[IU]/mL (ref 0.350–4.500)

## 2010-12-24 ENCOUNTER — Telehealth (INDEPENDENT_AMBULATORY_CARE_PROVIDER_SITE_OTHER): Payer: Medicare Other | Admitting: Internal Medicine

## 2010-12-24 DIAGNOSIS — R609 Edema, unspecified: Secondary | ICD-10-CM

## 2010-12-24 MED ORDER — FUROSEMIDE 20 MG PO TABS: 20.0000 mg | ORAL_TABLET | Freq: Two times a day (BID) | ORAL | Status: DC

## 2010-12-24 NOTE — Telephone Encounter (Signed)
Refill- furosemide 20mg  tablets. Take 2 tablets by mouth every day. Qty 60. Last fill 3.3.12

## 2010-12-24 NOTE — Telephone Encounter (Signed)
Rx refill sent to pharmacy, patient advised

## 2011-01-05 ENCOUNTER — Telehealth (INDEPENDENT_AMBULATORY_CARE_PROVIDER_SITE_OTHER): Payer: Medicare Other | Admitting: Internal Medicine

## 2011-01-05 DIAGNOSIS — IMO0001 Reserved for inherently not codable concepts without codable children: Secondary | ICD-10-CM

## 2011-01-05 DIAGNOSIS — E7800 Pure hypercholesterolemia, unspecified: Secondary | ICD-10-CM

## 2011-01-05 MED ORDER — ROSUVASTATIN CALCIUM 20 MG PO TABS: 20.0000 mg | ORAL_TABLET | Freq: Every day | ORAL | Status: DC

## 2011-01-05 MED ORDER — INSULIN PEN NEEDLE 31G X 5 MM MISC: Status: DC

## 2011-01-05 NOTE — Telephone Encounter (Signed)
Refills sent to pharmacy. 

## 2011-01-05 NOTE — Telephone Encounter (Signed)
Refill- crestor 20mg  tablets. Take 1 tablet by mouth daily. Qty 30. Last fill 4.26.12  Refill- b-d u/fn pen ndl mini 31g(purple). Use once daily as directed. Qty 100. Last fill 4.26.12

## 2011-01-13 ENCOUNTER — Encounter: Payer: Self-pay | Admitting: Internal Medicine

## 2011-01-20 NOTE — Discharge Summary (Signed)
[name], [name] NO.:  [id number]   MEDICAL RECORD NO.:  [id number]          PATIENT TYPE:  INP   LOCATION:  5022                         FACILITY:  [facility]   PHYSICIAN:  Darrick Penna. Hopper, MD,FACP,FCCPDATE OF BIRTH:  09-22[birth year]1936   DATE OF ADMISSION:  09/27/2008  DATE OF DISCHARGE:  10/05/2008                               DISCHARGE SUMMARY   DISCHARGE DIAGNOSES:  1. Acute hypoxic respiratory failure in setting of bilateral pulmonary      emboli.  2. History of hypertension with low blood pressure throughout      admission.  3. Anxiety and depression.  4. Dyslipidemia.   HISTORY OF PRESENT ILLNESS:  [name] is a 76 year old white female  with multiple medical problems who was at [facility] on day of  admission to establish primary care with Dr. Jadene Pierini.  The patient  complained of shortness of breath beginning on day prior to office visit  associated with some dizziness and worsening  on exertion.  The patient  denied any chest pain, syncope, or cough.  No fever or chills.  Upon  evaluation in the office, the patient had difficulty completing  sentences secondary to shortness of breath and found to have oxygen  saturation of 68% on room air at which time the patient was sent to the  hospital for further evaluation and treatment.  CT angio of the chest  performed at time of admission revealed multiple bilateral pulmonary  emboli with moderate-to-large clot burdens.  The patient was admitted  for definitive treatment.  Of note, daughter with history of DVTs, on  oral contraceptives. The  question has been raised whether the patient's  pulmonary emboli  may be secondary to hypercoagulopathy. She is on  herbal progesterone at time of admission.   PAST MEDICAL HISTORY:  1. Type 2 diabetes.  2. Hypertension.  3. Dyslipidemia.  4. Depression.  5. Hypothyroidism status post thyroidectomy and radiation therapy.  6. Obstructive sleep apnea, on  home CPAP.  7. Negative colon cancer screening.  8. Normal mammogram, 2008.   CONSULTATIONS DURING THIS ADMISSION:  1. [facility], Dr. Asencion Noble.  2. Psychiatry, Dr. Felizardo Hoffmann.   COURSE OF HOSPITALIZATION:  1. Acute hypoxic respiratory failure in setting of bilateral PEs.      Again, the patient noted to be hypoxic at time of admission.      Initially, the patient was admitted to Step-Down Unit and placed on      supplemental oxygen as well as IV heparin and Coumadin per pharmacy      protocol.  Again, the patient on herbal progesterone treatment at      time of admission which she has been instructed to discontinue as      it may have contributed to hypercoagulable state.  In addition,      hypercoagulable panel revealed protein C level slightly elevated at      179, otherwise within normal limits.  At this time, Coumadin and      heparin have been discontinued as the patient has opted for  treatment with Lowella Curb study drug, rivaroxaban managed by [facility].  The patient was maintaining adequate oxygen saturation      on 2 L of nasal cannula at 98-100% while at rest.  However, upon      ambulation on room air, the patient does drop below 90%.      Therefore, we will discharge home with continued oxygen therapy to      be weaned  by primary care physician.  2. History of hypertension with low blood pressure throughout      admission.  The patient with no clinical signs of intravascular      depletion or dehydration throughout admission.  However, blood      pressure has remained well controlled off all antihypertensive      medications.  We will hold spironolactone and Avapro at time of      discharge with close outpatient monitoring of the patient's blood      pressure to determine need for resuming medications.  3. Anxiety and depression.  The patient with history of anxiety and      depression prior to this admission being treated with  low-dose      SSRI, however, the patient has expressed great anxiety about going      home as well as about the diagnoses of PEs.  Dr. Rhona Raider was      asked to see the patient in consultation who has recommended      increasing the patient's Zoloft to 75 mg x4 days with increase to      100 mg on October 08, 2008.  Dr. Rhona Raider has also recommended      outpatient psychiatry followup.  The patient at this time plans on      following up with her counselor as well as a psychiatrist      appointment at [facility].  Please see Dr.      Daylene Katayama complete dictation for further details.  4. Dyslipidemia.  The patient on Lovaza and rice yeast prior to      admission.  However, fasting lipid profile obtained during this      admission revealed a total cholesterol of 256, triglycerides of      302, LDL of 163, and HDL of 33.  The patient was started on statin      therapy during this admission to be followed outpatient by primary      care physician.  Note, liver function tests performed during this      admission are within normal limits.   DISCHARGE MEDICATIONS:  1. Synthroid 100 mcg p.o. daily.  2. Lovaza 1 gram p.o. daily.  3. Actos 45 mg p.o. daily.  4. Fish oil 1000 mg p.o. daily.  5. Advair 250/50 one inhalation twice daily.  6. Crestor 10 mg p.o. daily.  Note, this is a new medication.  7. Einstein study drug, rivaroxaban taken as directed.  8. Zoloft 75 mg until October 07, 2008, then increase to 100 mg p.o.      daily.  9. The patient instructed to discontinue Avapro and spironolactone.   PERTINENT DISCHARGE LABORATORY DATA:  White cell count 9.1, platelet  count 217, hemoglobin 12.1, and hematocrit 36.1.  Sodium 134, potassium  4.5, and creatinine 1.20.  Hemoglobin A1c 7.1.  TSH 2.756.  Total  cholesterol 256, triglycerides 302, HDL 33, and LDL 163.  Hypercoagulable panel reveals functional protein C level of 179,  otherwise within  normal limits.    DISPOSITION:  The patient felt medically stable for discharge home at  this time.  The patient is to see  Dr. Drema Pry  Thursday, October 11, 2008 at 9:15 a.m. at which time it can be determined if the patient's  oxygen can be weaned.   TIME SPENT:  Greater than 30 minutes were spent on discharge planning.      Patrici Ranks, NP      Darrick Penna. Linna Darner, MD,FACP,FCCP  Electronically Signed    LE/MEDQ  D:  10/05/2008  T:  10/06/2008  Job:  [id number]   cc:   Sandy Salaam. Shawna Orleans, DO  Orinda Kenner, MD

## 2011-01-20 NOTE — Consult Note (Signed)
NAMESOSHA, EMSLEY NO.:  [id number]   MEDICAL RECORD NO.:  [id number]          PATIENT TYPE:  INP   LOCATION:  5022                         FACILITY:  [facility]   PHYSICIAN:  Felizardo Hoffmann, M.D.  DATE OF BIRTH:  12/18/1934   DATE OF CONSULTATION:  10/04/2008  DATE OF DISCHARGE:                                 CONSULTATION   INPATIENT CONSULTATION/FOLLOWUP:   HISTORY OF PRESENT ILLNESS:  Ms. Fei does have improved energy today.  She does continue with periods of sudden feeling on edge and thoughts of  dread.  However, she does mention that last night she was able to  curtail the progression of those symptoms through reassuring herself  that she was cardiovascularly stable.   She has intact orientation, memory function.  She is not having any  hallucinations or delusions.  She remained socially appropriate and  cooperative.   She has constructive future goals and interests.   REVIEW OF SYSTEMS:  GASTROINTESTINAL:  No loose stools or nausea  associated with increase Zoloft which is now at 75 mg daily.   PHYSICAL EXAMINATION:  VITAL SIGNS:  Temperature 97.6, pulse 67,  respiratory rate 18, blood pressure 113/45, oxygen saturation 97% on 2  liters.   MENTAL STATUS EXAM:  Ms. Townshend is alert.  Her eye contact is good.  Her  affect is slightly anxious.  Mood is slightly anxious.  She is oriented  to all spheres.  Her memory function is intact to immediate recent and  remote.  Her speech involves normal rate and prosody without dysarthria.  Thought process is logical, coherent, goal-directed.  No looseness of  associations.  Thought content:  No thoughts of harming herself or  others.  No delusions or hallucinations.  Her insight is intact.  Her  judgment is intact.   ASSESSMENT:  AXIS I:  (293.84)  Anxiety disorder not otherwise specified  (idiopathic and general medical factors).  Panic disorder without agoraphobia.   Ms. Cossairt is not at risk to harm  herself or others.  She agrees to call  emergency services immediately for any thoughts of harming herself or  other psychiatric emergency symptoms.   The undersigned provided education on deep breathing and progressive  muscle relaxation and recommended that she have three sessions a day.   The undersigned provided additional education.   RECOMMENDATIONS:  In 4 days would increase the Zoloft as tolerated to  100 mg p.o. q.a.m. for antianxiety.   PRELIMINARY DISCHARGE PLANNING:  Would have the social worker set Ms.  Piker up with an outpatient psychiatric follow-up during the first week  of discharge as well as outpatient psychotherapy involving cognitive  behavioral therapy, deep breathing and progressive muscle relaxation.   Outpatient psychiatric clinics can be found at one of the hospitals:  [facility] or [facility].   While increasing the Zoloft would be cautious about gastrointestinal  side effects such as nausea or loose stools .      Felizardo Hoffmann, M.D.  Electronically Signed     JW/MEDQ  D:  10/04/2008  T:  10/04/2008  Job:  [id number]

## 2011-01-20 NOTE — Consult Note (Signed)
[name], [name] NO.:  [id number]   MEDICAL RECORD NO.:  [id number]          PATIENT TYPE:  INP   LOCATION:  [id number]                         FACILITY:  [facility]   PHYSICIAN:  Felizardo Hoffmann, M.D.  DATE OF BIRTH:  1934/12/23   DATE OF CONSULTATION:  10/02/2008  DATE OF DISCHARGE:                                 CONSULTATION   REQUESTING PHYSICIAN:  Valerie A. Asa Lente, M.D.   REASON FOR CONSULTATION:  Anxiety.   HISTORY OF PRESENT ILLNESS:  [name] is a 76 year old female admitted  to the [facility] on the [facility] 27, 2008, due to a pulmonary  embolism.   [name] describes experiencing 5-10 minutes episodes of extreme  feeling on edge, muscle tension and a sense that she is going to past  along with the fear, dread and doom.  She does have ongoing excessive  worry.   She describes constructive future interests and hope.  She is not having  any thoughts of harming herself or others.  She has no hallucinations or  delusions.  Her orientation and memory function are intact.   She is cooperative and socially appropriate with the staff.   Her anxiety symptoms have continued without relief with her current  Zoloft dosage of 50 mg daily   [name] cannot identify any environments that are associated with the  onset of a panic attack.   PAST PSYCHIATRIC HISTORY:  [name] has had some symptoms of grief in  the past.  She experienced the death of her husband approximately 10  months ago after he experienced a chronic, debilitating illness.   She was crying at that time and was having anxiety, and her primary care  physician started her on Zoloft 50 mg per day.  The dosage has not  changed in over 8 months.   [name] has no history of other psych __________ She has undergone  regular counseling with a psychologist in town.  However, the panic has  not been brought up with the counselor yet.  Without revealing any  information to the patient's  daughter, though the patient's daughter did  share a history of panic attacks going back to when the daughter was  very young, confirming the patient's history.   FAMILY PSYCHIATRIC HISTORY:  [name] has an aunt who suffered from  depression and was treated with ECT.   SOCIAL HISTORY:  [name] is widowed.  She has been living by herself.  She has 2 daughters and a number of grandchildren.  Both daughters have  contact with [name] frequently. 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  One franchise is in  [place]; the other is in [place].  [name] is contemplating  selling those and retiring.  She is college educated and majored in  voice.  She also plays piano.  She does not use alcohol or illegal  drugs.  She has active religious faith.   PAST MEDICAL HISTORY:  1. History of hyperthyroidism with thyroid ablation and Synthroid      maintenance.  2. Pulmonary embolism.  3. Hypertension.  4. Diabetes.   SURGICAL HISTORY:  Thyroidectomy.   MEDICATIONS:  MAR is reviewed.  [name] is on Synthroid 100 mcg per  day along with Zoloft 50 mg daily.   In review of the past medical record, the emergency physician report  does not have Zoloft listed.  However, [name] confirms that has been  in place for approximately 10 months.   She has allergies to SULFA, ASPIRIN, TETRACYCLINE, GLUCOPHAGE and LATEX.   LABORATORY DATA:  Hepatic function panel:  SGOT 18, SGPT 18, albumin  3.1.  PT is at 3.1 post pulmonary embolism.  Hemoglobin A1c 7.1.  Sodium  134, BUN 24, creatinine 1.2, glucose 182.  TSH was normal at 2.756.   REVIEW OF SYSTEMS:  Constitutional, head, eyes, ears, nose, throat,  mouth neurologic, psychiatric, cardiovascular, respiratory,  gastrointestinal, genitourinary, skin, musculoskeletal, hematologic,  lymphatic, endocrine, metabolic all unremarkable.   EXAMINATION:  VITAL SIGNS:  Temperature 99.3, pulse 72,  respiratory rate  16, blood pressure 112/64, O2 saturation on 3 liters 96%.  GENERAL APPEARANCE:  [name] is an elderly female appearing  approximately  __________younger than her chronologic age sitting up in  her hospital chair with her nasal cannula in place.  She has no abnormal  involuntary movements.   MENTAL STATUS EXAMINATION:  [name] is alert.  Her eye contact is  good.  Her affect is mildly anxious.  Mood is mildly anxious.  Her  attention span is normal.  Concentration is within normal limits.  She  is oriented completely to all spheres.  Her memory is intact to  immediate, recent and remote.  Her fund of knowledge and intelligence  are within normal limits.  Her speech involves normal rate and prosody  without dysarthria.  Thought process is logical, coherent, goal-  directed.  No looseness of associations.  Thought content:  No thoughts  of harming herself or others.  No delusions or hallucinations.   Her insight is intact.  Judgment is intact.   ASSESSMENT:  AXIS I:  293.84 anxiety disorder not otherwise specified  (idiopathic history and some acute general medical factors).  Panic disorder without agoraphobia.  AXIS II:  Deferred.  AXIS III:  See past medical history.  AXIS IV:  Grief, general medical, occupational.  AXIS V:  82.   [name] is not at risk to harm herself or others.  She agrees to call  emergency services immediately for any psychiatric emergency symptoms.   The undersigned provided ego-supportive psychotherapy and education.   The indications, alternatives and adverse effects of Zoloft were  discussed for anti-anxiety including anti-panic.  [name] understands  the above information and would like to proceed with a Zoloft increase.   The undersigned also discussed proceeding with an outpatient course of  cognitive behavioral therapy combined with deep breathing and  progressive muscle relaxation.   RECOMMENDATIONS:  1. Would  increase Zoloft 75 mg p.o. daily with caution about side      effects of nausea or loose stools.  2. Would then increase to 100 mg daily and 1 week as tolerated.  3. The undersigned has asked the social worker to set up a psychiatric      outpatient follow-up along with a counseling follow-up.  The      undersigned also advised the patient to sign a release of      information between her mental practitioners to allow them to  coordinate treatment of her anxiety.   Outpatient psychiatric clinics are available at [facility],  [facility] and [facility].   DISCUSSION:  When utilizing Zoloft to treat anxiety disorders, the  duration to efficacy is usually as much as 16 weeks and may require as  much as 75-100% of the maximum daily dosage (decreased in the elderly).   Cognitive behavioral therapy combined with deep breathing and  progressive muscle relaxation can be extremely effective for anxiety.      Felizardo Hoffmann, M.D.  Electronically Signed     JW/MEDQ  D:  10/02/2008  T:  10/02/2008  Job:  [id number]

## 2011-01-23 NOTE — Assessment & Plan Note (Signed)
[facility]                         GASTROENTEROLOGY OFFICE NOTE   MALIKIA, BERGS                      MRN:          [id number]  DATE:09/06/2006                            DOB:          01-29-1935    REFERRING PHYSICIAN:  Lina Sayre. Everlene Farrier, M.D.   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 Everlene Farrier for asymptomatic rectal bleeding.   HISTORY OF PRESENT ILLNESS:  [name] had an episode of bright red  blood per rectum several weeks ago that lasted one day. This was fresh  heme and she did notice it with wiping. She had a similar episode  approximately 2 years ago. She saw Dr. Everlene Farrier, apparently had a negative  examination and had Hemoccults checked which apparently were guaiac  negative. Laboratory data was unremarkable and she was treated with  Anusol-HC suppositories and remains asymptomatic. Hemoglobin was 13.0  with an MCV of 93.2.   The patient had some mild constipation and has noticed rectal bleeding  with straining. She has never had barium studies or colonoscopy exam.  She denies abdominal pain and any upper GI or hepatobiliary complaints.  Recent liver function tests were normal. She follows a regular diet and  denies any unwanted weight loss.   PAST MEDICAL HISTORY:  The patient has well controlled essential  hypertension, adult onset diabetes, hypercholesterolemia, and chronic  anxiety and depression. She also suffers from sleep apnea. She has had  cataract surgery in both eyes.   MEDICATIONS:  1. Actos 45 mg a day.  2. Fexofenadine 180 mg a day.  3. Avapro 75 mg a day.  4. Synthroid 0.1 mg a day.  5. Sertraline 50 mg a day.  6. Crestor 10 mg a day.  7. Echinacea 125 mg a day.  8. CoQ-10 100 mg a day.  9. Multivitamins daily.  10.She uses p.r.n. Advil.   She in the past has had reactions to ASPIRIN, TETRACYCLINE, SULFA and  PENICILLIN.   FAMILY HISTORY:  Noncontributory.   SOCIAL HISTORY:  She is married and lives with her husband. She has a  grad school education. She does not smoke or abuse ethanol.   REVIEW OF SYSTEMS:  Noncontributory except for some nonspecific  arthralgias. She does give a vague history of some unknown cardiac  arrhythmia problems. She does suffer from rather marked hyperlipidemia  on recent laboratory data with her cholesterol 324 mg percent. Review of  systems otherwise negative.   PHYSICAL EXAMINATION:  GENERAL:  She is a very pleasant, white female  appearing her stated age in no acute distress. I cannot appreciate  stigmata of chronic liver disease.  VITAL SIGNS:  She is 5 feet 3 inches tall and weighs 247 pounds. Blood  pressure is 138/78, pulse was 80 and regular.  NECK:  Could not appreciate thyromegaly or lymphadenopathy.  CHEST:  Clear to percussion and auscultation.  HEART:  There was no murmur, gallop or rub noted. She appeared to be in  a regular rhythm.  ABDOMEN:  Showed exogenous obesity but there was no definite  organomegaly,  masses or tenderness.  EXTREMITIES:  Peripheral extremities were unremarkable without edema or  phlebitis.  NEUROLOGIC:  Mental status was clear.  RECTAL:  Deferred.   ASSESSMENT:  1. [name] has had some asymptomatic rectal bleeding with      associated constipation and probably has internal hemorrhoids. We      need to exclude colonic polyposis and she certainly needs a      colonoscopy exam because of her age and her symptomatology.  2. Well controlled adult onset diabetes with obesity.  3. Marked hypercholesterolemia.  4. Chronic thyroid dysfunction.  5. Chronic anxiety and depression.  6. History of sleep apnea.  7. History of cataract surgery.   RECOMMENDATIONS:  Outpatient colonoscopy at her convenience. The risks  and benefits of this procedure have been explained in detail and will  proceed as planned. She is to continue her other medications as per Dr.  Everlene Farrier.      Loralee Pacas. Sharlett Iles, MD, Quentin Ore, [place]  Electronically Signed    DRP/MedQ  DD: 09/06/2006  DT: 09/06/2006  Job #: [id number]   cc:   Richardson Landry A. Everlene Farrier, M.D.

## 2011-01-28 ENCOUNTER — Other Ambulatory Visit: Payer: Self-pay | Admitting: Hematology & Oncology

## 2011-01-28 ENCOUNTER — Encounter (HOSPITAL_BASED_OUTPATIENT_CLINIC_OR_DEPARTMENT_OTHER): Payer: Medicare Other | Admitting: Hematology & Oncology

## 2011-01-28 DIAGNOSIS — D649 Anemia, unspecified: Secondary | ICD-10-CM

## 2011-01-28 DIAGNOSIS — I2699 Other pulmonary embolism without acute cor pulmonale: Secondary | ICD-10-CM

## 2011-01-28 LAB — CBC WITH DIFFERENTIAL (CANCER CENTER ONLY)
BASO#: 0 10*3/uL (ref 0.0–0.2)
BASO%: 0.1 % (ref 0.0–2.0)
EOS%: 4.2 % (ref 0.0–7.0)
Eosinophils Absolute: 0.3 10*3/uL (ref 0.0–0.5)
HCT: 33.9 % — ABNORMAL LOW (ref 34.8–46.6)
HGB: 11.3 g/dL — ABNORMAL LOW (ref 11.6–15.9)
LYMPH#: 1.7 10*3/uL (ref 0.9–3.3)
LYMPH%: 24.3 % (ref 14.0–48.0)
MCH: 30.5 pg (ref 26.0–34.0)
MCHC: 33.3 g/dL (ref 32.0–36.0)
MCV: 91 fL (ref 81–101)
MONO#: 0.5 10*3/uL (ref 0.1–0.9)
MONO%: 7.2 % (ref 0.0–13.0)
NEUT#: 4.5 10*3/uL (ref 1.5–6.5)
NEUT%: 64.2 % (ref 39.6–80.0)
Platelets: 144 10*3/uL — ABNORMAL LOW (ref 145–400)
RBC: 3.71 10*6/uL (ref 3.70–5.32)
RDW: 16.9 % — ABNORMAL HIGH (ref 11.1–15.7)
WBC: 7 10*3/uL (ref 3.9–10.0)

## 2011-01-28 LAB — CHCC SATELLITE - SMEAR

## 2011-01-29 LAB — IRON AND TIBC
%SAT: 19 % — ABNORMAL LOW (ref 20–55)
Iron: 68 ug/dL (ref 42–145)
TIBC: 363 ug/dL (ref 250–470)
UIBC: 295 ug/dL

## 2011-01-29 LAB — D-DIMER, QUANTITATIVE: D-Dimer, Quant: 0.52 ug/mL-FEU — ABNORMAL HIGH (ref 0.00–0.48)

## 2011-01-29 LAB — RETICULOCYTES (CHCC)
ABS Retic: 73.3 10*3/uL (ref 19.0–186.0)
RBC.: 3.86 MIL/uL — ABNORMAL LOW (ref 3.87–5.11)
Retic Ct Pct: 1.9 % (ref 0.4–3.1)

## 2011-01-29 LAB — FERRITIN: Ferritin: 93 ng/mL (ref 10–291)

## 2011-01-30 ENCOUNTER — Telehealth (INDEPENDENT_AMBULATORY_CARE_PROVIDER_SITE_OTHER): Payer: Medicare Other | Admitting: Internal Medicine

## 2011-01-30 DIAGNOSIS — R609 Edema, unspecified: Secondary | ICD-10-CM

## 2011-01-30 MED ORDER — FUROSEMIDE 20 MG PO TABS: 20.0000 mg | ORAL_TABLET | Freq: Two times a day (BID) | ORAL | Status: DC

## 2011-01-30 NOTE — Telephone Encounter (Signed)
Rx refill sent to pharmacy. 

## 2011-01-30 NOTE — Telephone Encounter (Signed)
Pt states that she is out of furosemide. She states that she did call pharmacy but they told her to call us.

## 2011-03-06 ENCOUNTER — Telehealth: Payer: Self-pay | Admitting: *Deleted

## 2011-03-06 MED ORDER — SCOPOLAMINE 1 MG/3DAYS TD PT72: MEDICATED_PATCH | TRANSDERMAL | Status: DC

## 2011-03-06 NOTE — Telephone Encounter (Signed)
She can have Scopolamine patch 1.5mg /72hour place behind ear, Disp #5, warn her that it could cause a drop in BP. So if she feels light headed she should stop it.

## 2011-03-06 NOTE — Telephone Encounter (Signed)
Call placed to patient at [phone], she was informed per Dr Charlett Blake instruction and has verbalized understanding

## 2011-03-06 NOTE — Telephone Encounter (Signed)
Patient called and left voice message [place] be leaving on July 5th for an [place] cruise. She would like to know if she could get a rx for nausea patches that she could use behind her ears while she is on her cruise. If approved she is requesting the rx called to the [facility] at [phone].

## 2011-04-07 ENCOUNTER — Telehealth: Payer: Self-pay | Admitting: Internal Medicine

## 2011-04-07 MED ORDER — SERTRALINE HCL 100 MG PO TABS: 100.0000 mg | ORAL_TABLET | Freq: Every day | ORAL | Status: DC

## 2011-04-07 NOTE — Telephone Encounter (Signed)
Refill on zoloft 100 mg she has been without meds for 4 days,.  Please call in today to [facility]  [address]

## 2011-04-07 NOTE — Telephone Encounter (Signed)
Rx refill sent to pharmacy. 

## 2011-05-05 ENCOUNTER — Other Ambulatory Visit: Payer: Self-pay | Admitting: *Deleted

## 2011-05-05 NOTE — Telephone Encounter (Signed)
Voice message left from [place] requesting refill on patients behalf for Diovan.

## 2011-05-06 MED ORDER — LOSARTAN POTASSIUM 50 MG PO TABS: 50.0000 mg | ORAL_TABLET | Freq: Every day | ORAL | Status: DC

## 2011-05-06 NOTE — Telephone Encounter (Signed)
Call placed to [facility]  [phone], spoke with Ezelin, she stated the request was for Losartan and not Diovan. She was informed Rx would be sent electonically.

## 2011-05-18 ENCOUNTER — Ambulatory Visit: Payer: Medicare Other | Admitting: Internal Medicine

## 2011-06-01 ENCOUNTER — Ambulatory Visit: Payer: Medicare Other | Admitting: Internal Medicine

## 2011-06-05 ENCOUNTER — Ambulatory Visit: Payer: Medicare Other | Admitting: Internal Medicine

## 2011-06-09 ENCOUNTER — Encounter: Payer: Self-pay | Admitting: Internal Medicine

## 2011-06-09 ENCOUNTER — Ambulatory Visit: Payer: Medicare Other | Admitting: Internal Medicine

## 2011-06-09 ENCOUNTER — Ambulatory Visit (INDEPENDENT_AMBULATORY_CARE_PROVIDER_SITE_OTHER): Payer: Medicare Other | Admitting: Internal Medicine

## 2011-06-09 VITALS — BP 136/64 | HR 64 | Temp 98.6°F | Ht 63.0 in | Wt 257.0 lb

## 2011-06-09 DIAGNOSIS — Z23 Encounter for immunization: Secondary | ICD-10-CM

## 2011-06-09 DIAGNOSIS — L259 Unspecified contact dermatitis, unspecified cause: Secondary | ICD-10-CM

## 2011-06-09 DIAGNOSIS — R609 Edema, unspecified: Secondary | ICD-10-CM

## 2011-06-09 DIAGNOSIS — I10 Essential (primary) hypertension: Secondary | ICD-10-CM

## 2011-06-09 DIAGNOSIS — IMO0001 Reserved for inherently not codable concepts without codable children: Secondary | ICD-10-CM

## 2011-06-09 DIAGNOSIS — E7800 Pure hypercholesterolemia, unspecified: Secondary | ICD-10-CM

## 2011-06-09 DIAGNOSIS — Z2911 Encounter for prophylactic immunotherapy for respiratory syncytial virus (RSV): Secondary | ICD-10-CM

## 2011-06-09 MED ORDER — FUROSEMIDE 20 MG PO TABS: 20.0000 mg | ORAL_TABLET | Freq: Two times a day (BID) | ORAL | Status: DC

## 2011-06-09 NOTE — Assessment & Plan Note (Addendum)
She stopped taking Crestor several months ago due to low back pain. The patient attributed her symptoms to statin. I suggest she restart at 10 mg.   Monitor for muscle symptoms Goal LDL less than 70 Repeat FLP

## 2011-06-09 NOTE — Patient Instructions (Signed)
Please complete the following lab tests within the next 1-2 days: BMET, A1c, microalb /cr ratio - 250.02 LFTs, lipid panel - 272.4 TSH - 244.9

## 2011-06-09 NOTE — Assessment & Plan Note (Signed)
Stable.  Continue current medication regimen.  Losartan 50 mg daily. Furosemide 20 mg twice a day Lab Results  Component Value Date   CREATININE 1.1 09/16/2010

## 2011-06-09 NOTE — Assessment & Plan Note (Signed)
Blood sugars are suboptimally controlled.  She is followed by endo (Dr. Suzette Battiest)  She take 60 units of 70/30 in the morning and 75 units in the evening.  Patient reports her blood sugars were much higher than normal 2 months ago after receiving steroid injection to left knee. Monitor A1c, check microalbumin / cr  ratio eye exam is up to date  foot exam is normal  Patient up-to-date with influenza vaccine

## 2011-06-09 NOTE — Progress Notes (Signed)
Subjective:    Patient ID: [name], female    DOB: 19-Jun-1935, 76 y.o.   MRN: [id number]  HPI  76 year old white female with history of type 2 diabetes, hypertension and history of PE for routine followup. Overall-patient has been doing fairly well. It has been several months since she has seen her endocrinologist. She reports her blood sugars were exacerbated by steroid injection to left knee 2 months ago. Most recently her morning blood sugar has been 141.  She also reports having significant dental work within the last one month. Anxiety regarding dental work resulted in episodic elevated blood pressure reading of 190/80. Her blood pressure has since returned to normal.  Stasis dermatitis-patient was seen by dermatologist. Some improvement with use of compression stockings  Review of Systems Negative for chest pain or shortness of breath,  intermittent dizziness after prolonged sitting (especially when she takes furosemide twice a day)    Past Medical History  Diagnosis Date  . Unspecified hypothyroidism   . Pure hypercholesterolemia   . Unspecified essential hypertension   . Syncope and collapse   . Type II or unspecified type diabetes mellitus without mention of complication, not stated as uncontrolled   . Depressive disorder, not elsewhere classified   . Extrinsic asthma, unspecified     no problem since adulthood  . Obesity   . Anxiety state, unspecified     panic attacks  . OSA on CPAP     severe  . Allergic rhinitis   . Respiratory failure with hypoxia 09/2008    acute, secondary to multiple bilateral pulmonary embolism , negative hypercoagulable workup 09/2008 hospital stay    History   Social History  . Marital Status: Widowed    Spouse Name: N/A    Number of Children: 2  . Years of Education: N/A   Occupational History  . 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 Smoking status: Former Smoker -- 20 years   Quit date: 09/07/1989  . Smokeless tobacco: Not on file  . Alcohol Use: No  . Drug Use: No  . Sexually Active:    Other Topics Concern  . Not on file   Social History Narrative  . No narrative on file    Past Surgical History  Procedure Date  . Thyroid surgery     Family History  Problem Relation Age of Onset  . Arthritis Other   . Diabetes Other   . Hyperlipidemia Other   . Hypertension Other   . Coronary artery disease Other   . Stroke Other   . Allergies Mother   . Clotting disorder Mother   . Osteoarthritis Mother   . Asthma Mother   . Osteoarthritis Daughter   . Rheum arthritis Maternal Grandmother   . Clotting disorder Maternal Grandmother   . Clotting disorder Maternal Uncle   . Clotting disorder Daughter   . Allergies Daughter     Allergies  Allergen Reactions  . Advair Hfa   . Aspirin   . Latex   . Lisinopril     REACTION: cough  . Metformin     REACTION: nausea  . Sulfonamide Derivatives   . Tetracycline     Current Outpatient Prescriptions on File Prior to Visit  Medication Sig Dispense Refill  . Cholecalciferol (VITAMIN D) 2000 UNITS CAPS Take 1 capsule by mouth daily.        . Coenzyme Q10 200 MG capsule Take 200  mg by mouth daily.        . fexofenadine (ALLEGRA) 180 MG tablet Take 180 mg by mouth daily.        . fluticasone (FLONASE) 50 MCG/ACT nasal spray 2 sprays each nostril once daily       . Insulin Lispro Prot & Lispro (HUMALOG MIX 75/25 Anna) Inject 60 units subcutaneously every morning and 70 units every evening       . Insulin Pen Needle (B-D UF III MINI PEN NEEDLES) 31G X 5 MM MISC Use daily for insulin injection  100 each  2  . levothyroxine (SYNTHROID, LEVOTHROID) 100 MCG tablet Take 100 mcg by mouth daily.        Marland Kitchen losartan (COZAAR) 50 MG tablet Take 1 tablet (50 mg total) by mouth daily.  30 tablet  3  . Multiple Vitamin (MULTIVITAMIN) tablet Take 1 tablet by mouth daily.        . nateglinide (STARLIX) 60 MG tablet One tab at  noon before meals as needed       . ranitidine (ZANTAC) 150 MG capsule Take 150 mg by mouth 2 (two) times daily as needed.        . sertraline (ZOLOFT) 100 MG tablet Take 1 tablet (100 mg total) by mouth daily.  30 tablet  6  . rosuvastatin (CRESTOR) 20 MG tablet Take 1 tablet (20 mg total) by mouth at bedtime.  30 tablet  5    BP 136/64  Pulse 64  Temp(Src) 98.6 F (37 C) (Oral)  Ht 5\' 3"  (1.6 m)  Wt 257 lb (116.574 kg)  BMI 45.53 kg/m2     Objective:   Physical Exam   Constitutional: pleasant, obese Head: Normocephalic and atraumatic.  Right Ear: External ear normal.  Left Ear: External ear normal.  Mouth/Throat: Oropharynx is clear and moist.  Eyes: Conjunctivae are normal. Pupils are equal, round, and reactive to light.  Neck: Normal range of motion. Neck supple. No thyromegaly present. No carotid bruit Cardiovascular: Normal rate, regular rhythm and normal heart sounds.  Exam reveals no gallop and no friction rub.   No murmur heard. Pulmonary/Chest: Effort normal and breath sounds normal.  No wheezes. No rales.  Abdominal: Soft. Bowel sounds are normal. No mass. There is no tenderness.  Neurological: Alert. No cranial nerve deficit.  Skin: Skin is warm and dry.  Psychiatric: Normal mood and affect. Behavior is normal.         Assessment & Plan:

## 2011-06-09 NOTE — Progress Notes (Signed)
Addended by: Townsend Roger D on: 06/09/2011 05:36 PM   Modules accepted: Orders

## 2011-06-09 NOTE — Assessment & Plan Note (Signed)
The patient has chronic stasis dermatitis of her lower extremities. Some improvement with the use of impression stockings.

## 2011-06-10 ENCOUNTER — Other Ambulatory Visit (INDEPENDENT_AMBULATORY_CARE_PROVIDER_SITE_OTHER): Payer: Medicare Other

## 2011-06-10 ENCOUNTER — Other Ambulatory Visit: Payer: Medicare Other

## 2011-06-10 DIAGNOSIS — IMO0001 Reserved for inherently not codable concepts without codable children: Secondary | ICD-10-CM

## 2011-06-10 DIAGNOSIS — E039 Hypothyroidism, unspecified: Secondary | ICD-10-CM

## 2011-06-10 DIAGNOSIS — E785 Hyperlipidemia, unspecified: Secondary | ICD-10-CM

## 2011-06-10 LAB — HEPATIC FUNCTION PANEL
ALT: 37 U/L — ABNORMAL HIGH (ref 0–35)
AST: 38 U/L — ABNORMAL HIGH (ref 0–37)
Albumin: 4 g/dL (ref 3.5–5.2)
Alkaline Phosphatase: 55 U/L (ref 39–117)
Bilirubin, Direct: 0 mg/dL (ref 0.0–0.3)
Total Bilirubin: 0.8 mg/dL (ref 0.3–1.2)
Total Protein: 7.2 g/dL (ref 6.0–8.3)

## 2011-06-10 LAB — BASIC METABOLIC PANEL
BUN: 29 mg/dL — ABNORMAL HIGH (ref 6–23)
CO2: 29 mEq/L (ref 19–32)
Calcium: 9.2 mg/dL (ref 8.4–10.5)
Chloride: 100 mEq/L (ref 96–112)
Creatinine, Ser: 1.2 mg/dL (ref 0.4–1.2)
GFR: 46.25 mL/min — ABNORMAL LOW (ref >60.00)
Glucose, Bld: 189 mg/dL — ABNORMAL HIGH (ref 70–99)
Potassium: 3.7 mEq/L (ref 3.5–5.1)
Sodium: 140 mEq/L (ref 135–145)

## 2011-06-10 LAB — MICROALBUMIN / CREATININE URINE RATIO
Creatinine,U: 198.9 mg/dL
Microalb Creat Ratio: 1 mg/g (ref 0.0–30.0)
Microalb, Ur: 1.9 mg/dL (ref 0.0–1.9)

## 2011-06-10 LAB — LIPID PANEL
Cholesterol: 353 mg/dL — ABNORMAL HIGH (ref 0–200)
HDL: 47.2 mg/dL (ref >39.00)
Total CHOL/HDL Ratio: 7
Triglycerides: 858 mg/dL — ABNORMAL HIGH (ref 0.0–149.0)
VLDL: 171.6 mg/dL — ABNORMAL HIGH (ref 0.0–40.0)

## 2011-06-10 LAB — LDL CHOLESTEROL, DIRECT: Direct LDL: 135.3 mg/dL

## 2011-06-10 LAB — TSH: TSH: 1.82 u[IU]/mL (ref 0.35–5.50)

## 2011-06-12 LAB — HEMOGLOBIN A1C: Hgb A1c MFr Bld: 8.7 % — ABNORMAL HIGH (ref 4.6–6.5)

## 2011-06-15 ENCOUNTER — Other Ambulatory Visit: Payer: Self-pay | Admitting: Internal Medicine

## 2011-09-03 ENCOUNTER — Telehealth: Payer: Self-pay | Admitting: Internal Medicine

## 2011-09-03 NOTE — Telephone Encounter (Signed)
Pt called and said that she has scratchy sore throat. Pt is afraid she will get an inf in lungs if she can't get an abx or ov with pcp. Pt is req call from nurse.

## 2011-09-03 NOTE — Telephone Encounter (Signed)
No appts available.  Pt wants to know if you will call her in something

## 2011-09-04 ENCOUNTER — Ambulatory Visit (INDEPENDENT_AMBULATORY_CARE_PROVIDER_SITE_OTHER): Payer: Medicare Other | Admitting: Internal Medicine

## 2011-09-04 ENCOUNTER — Encounter: Payer: Self-pay | Admitting: Internal Medicine

## 2011-09-04 VITALS — BP 134/66 | Temp 98.5°F | Wt 253.0 lb

## 2011-09-04 DIAGNOSIS — Z01810 Encounter for preprocedural cardiovascular examination: Secondary | ICD-10-CM | POA: Insufficient documentation

## 2011-09-04 DIAGNOSIS — J069 Acute upper respiratory infection, unspecified: Secondary | ICD-10-CM

## 2011-09-04 DIAGNOSIS — I10 Essential (primary) hypertension: Secondary | ICD-10-CM

## 2011-09-04 NOTE — Assessment & Plan Note (Signed)
Patient scheduled to have left total knee replacement.  Refer to cardiology for preop testing.

## 2011-09-04 NOTE — Patient Instructions (Signed)
Use salt water gargles for sore throat and continue using mucinex DM Please call our office if your symptoms do not improve or gets worse.

## 2011-09-04 NOTE — Progress Notes (Signed)
Subjective:    Patient ID: [name], female    DOB: 10-Mar-1935, 76 y.o.   MRN: [id number]  URI  This is a new problem. The current episode started in the past 7 days. The problem has been unchanged. There has been no fever. Associated symptoms include congestion, coughing and a sore throat. She has tried nothing for the symptoms.   Patient also reports she'll be having left knee replacement in late January of 2013. This surgery is to be completed at a teaching facility in [place].  After her surgery, she plans to reside at rehab facility short term.  She request form be completed.   Review of Systems  HENT: Positive for congestion and sore throat.   Respiratory: Positive for cough.    Past Medical History  Diagnosis Date  . Unspecified hypothyroidism   . Pure hypercholesterolemia   . Unspecified essential hypertension   . Syncope and collapse   . Type II or unspecified type diabetes mellitus without mention of complication, not stated as uncontrolled   . Depressive disorder, not elsewhere classified   . Extrinsic asthma, unspecified     no problem since adulthood  . Obesity   . Anxiety state, unspecified     panic attacks  . OSA on CPAP     severe  . Allergic rhinitis   . Respiratory failure with hypoxia 09/2008    acute, secondary to multiple bilateral pulmonary embolism , negative hypercoagulable workup 09/2008 hospital stay    History   Social History  . Marital Status: Widowed    Spouse Name: N/A    Number of Children: 2  . Years of Education: N/A   Occupational History  . 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 Smoking status: Former Smoker -- 20 years    Quit date: 09/07/1989  . Smokeless tobacco: Not on file  . Alcohol Use: No  . Drug Use: No  . Sexually Active:    Other Topics Concern  . Not on file   Social History Narrative  . No narrative on file    Past Surgical History  Procedure Date  .  Thyroid surgery     Family History  Problem Relation Age of Onset  . Arthritis Other   . Diabetes Other   . Hyperlipidemia Other   . Hypertension Other   . Coronary artery disease Other   . Stroke Other   . Allergies Mother   . Clotting disorder Mother   . Osteoarthritis Mother   . Asthma Mother   . Osteoarthritis Daughter   . Rheum arthritis Maternal Grandmother   . Clotting disorder Maternal Grandmother   . Clotting disorder Maternal Uncle   . Clotting disorder Daughter   . Allergies Daughter     Allergies  Allergen Reactions  . Advair Hfa   . Aspirin   . Latex   . Lisinopril     REACTION: cough  . Metformin     REACTION: nausea  . Sulfonamide Derivatives   . Tetracycline     Current Outpatient Prescriptions on File Prior to Visit  Medication Sig Dispense Refill  . Cholecalciferol (VITAMIN D) 2000 UNITS CAPS Take 1 capsule by mouth daily.        . Coenzyme Q10 200 MG capsule Take 200 mg by mouth daily.        . fexofenadine (ALLEGRA) 180 MG tablet Take 180 mg by mouth daily.        Marland Kitchen  fluticasone (FLONASE) 50 MCG/ACT nasal spray 2 sprays each nostril once daily       . furosemide (LASIX) 20 MG tablet Take 1 tablet (20 mg total) by mouth 2 (two) times daily.  60 tablet  5  . HUMULIN 70/30 (70-30) 100 UNIT/ML injection 70 units in the morning and 70 units in the evening      . Insulin Pen Needle (B-D UF III MINI PEN NEEDLES) 31G X 5 MM MISC Use daily for insulin injection  100 each  2  . levothyroxine (SYNTHROID, LEVOTHROID) 100 MCG tablet Take 100 mcg by mouth daily.        Marland Kitchen losartan (COZAAR) 50 MG tablet Take 1 tablet (50 mg total) by mouth daily.  30 tablet  3  . Multiple Vitamin (MULTIVITAMIN) tablet Take 1 tablet by mouth daily.        . ranitidine (ZANTAC) 150 MG capsule Take 150 mg by mouth 2 (two) times daily as needed.        . rosuvastatin (CRESTOR) 20 MG tablet Take 0.5 tablets (10 mg total) by mouth at bedtime.  30 tablet  5  . sertraline (ZOLOFT) 100 MG  tablet Take 1 tablet (100 mg total) by mouth daily.  30 tablet  6    BP 134/66  Temp(Src) 98.5 F (36.9 C) (Oral)  Wt 253 lb (114.76 kg)     Objective:   Physical Exam  Constitutional: She appears well-developed and well-nourished.  HENT:  Head: Normocephalic and atraumatic.  Right Ear: External ear normal.  Left Ear: External ear normal.  Cardiovascular: Normal rate, regular rhythm and normal heart sounds.   Pulmonary/Chest: Breath sounds normal. No respiratory distress. She has no wheezes. She has no rales.  Skin: Skin is warm and dry.  Psychiatric: She has a normal mood and affect. Her behavior is normal.          Assessment & Plan:

## 2011-09-04 NOTE — Telephone Encounter (Signed)
Ok to add as 4:15 pt today

## 2011-09-04 NOTE — Telephone Encounter (Signed)
Pt scheduled  

## 2011-09-04 NOTE — Assessment & Plan Note (Signed)
76 year old female with symptoms of viral URI. Discussed symptomatic treatment.  Patient advised to call office if symptoms persist or worsen.

## 2011-09-08 HISTORY — PX: OTHER SURGICAL HISTORY: SHX169

## 2011-09-19 ENCOUNTER — Other Ambulatory Visit: Payer: Self-pay | Admitting: Internal Medicine

## 2011-09-21 NOTE — Telephone Encounter (Signed)
One refill sent to pharmacy. Office visit is needed for future refills.

## 2011-09-25 ENCOUNTER — Ambulatory Visit (INDEPENDENT_AMBULATORY_CARE_PROVIDER_SITE_OTHER): Payer: Medicare Other | Admitting: Internal Medicine

## 2011-09-25 ENCOUNTER — Encounter: Payer: Self-pay | Admitting: Internal Medicine

## 2011-09-25 DIAGNOSIS — E039 Hypothyroidism, unspecified: Secondary | ICD-10-CM

## 2011-09-25 DIAGNOSIS — R609 Edema, unspecified: Secondary | ICD-10-CM

## 2011-09-25 DIAGNOSIS — Z01810 Encounter for preprocedural cardiovascular examination: Secondary | ICD-10-CM

## 2011-09-25 DIAGNOSIS — IMO0001 Reserved for inherently not codable concepts without codable children: Secondary | ICD-10-CM

## 2011-09-25 DIAGNOSIS — E7800 Pure hypercholesterolemia, unspecified: Secondary | ICD-10-CM

## 2011-09-25 DIAGNOSIS — E785 Hyperlipidemia, unspecified: Secondary | ICD-10-CM

## 2011-09-25 DIAGNOSIS — I10 Essential (primary) hypertension: Secondary | ICD-10-CM

## 2011-09-25 LAB — HEPATIC FUNCTION PANEL
ALT: 25 U/L (ref 0–35)
AST: 29 U/L (ref 0–37)
Albumin: 4.1 g/dL (ref 3.5–5.2)
Alkaline Phosphatase: 50 U/L (ref 39–117)
Bilirubin, Direct: 0 mg/dL (ref 0.0–0.3)
Total Bilirubin: 0.2 mg/dL — ABNORMAL LOW (ref 0.3–1.2)
Total Protein: 7.2 g/dL (ref 6.0–8.3)

## 2011-09-25 LAB — BASIC METABOLIC PANEL
BUN: 22 mg/dL (ref 6–23)
CO2: 26 mEq/L (ref 19–32)
Calcium: 8.7 mg/dL (ref 8.4–10.5)
Chloride: 100 mEq/L (ref 96–112)
Creatinine, Ser: 1 mg/dL (ref 0.4–1.2)
GFR: 55.65 mL/min — ABNORMAL LOW (ref >60.00)
Glucose, Bld: 136 mg/dL — ABNORMAL HIGH (ref 70–99)
Potassium: 3.8 mEq/L (ref 3.5–5.1)
Sodium: 140 mEq/L (ref 135–145)

## 2011-09-25 LAB — LDL CHOLESTEROL, DIRECT: Direct LDL: 108.2 mg/dL

## 2011-09-25 LAB — CBC WITH DIFFERENTIAL/PLATELET
Basophils Absolute: 0 10*3/uL (ref 0.0–0.1)
Basophils Relative: 0.5 % (ref 0.0–3.0)
Eosinophils Absolute: 0.1 10*3/uL (ref 0.0–0.7)
Eosinophils Relative: 2.7 % (ref 0.0–5.0)
HCT: 33.9 % — ABNORMAL LOW (ref 36.0–46.0)
Hemoglobin: 11.6 g/dL — ABNORMAL LOW (ref 12.0–15.0)
Lymphocytes Relative: 24.6 % (ref 12.0–46.0)
Lymphs Abs: 1.4 10*3/uL (ref 0.7–4.0)
MCHC: 34.1 g/dL (ref 30.0–36.0)
MCV: 92.1 fl (ref 78.0–100.0)
Monocytes Absolute: 0.4 10*3/uL (ref 0.1–1.0)
Monocytes Relative: 7.9 % (ref 3.0–12.0)
Neutro Abs: 3.5 10*3/uL (ref 1.4–7.7)
Neutrophils Relative %: 64.3 % (ref 43.0–77.0)
Platelets: 179 10*3/uL (ref 150.0–400.0)
RBC: 3.68 Mil/uL — ABNORMAL LOW (ref 3.87–5.11)
RDW: 17.2 % — ABNORMAL HIGH (ref 11.5–14.6)
WBC: 5.5 10*3/uL (ref 4.5–10.5)

## 2011-09-25 LAB — LIPID PANEL
Cholesterol: 290 mg/dL — ABNORMAL HIGH (ref 0–200)
HDL: 43.4 mg/dL (ref >39.00)
Total CHOL/HDL Ratio: 7
Triglycerides: 591 mg/dL — ABNORMAL HIGH (ref 0.0–149.0)
VLDL: 118.2 mg/dL — ABNORMAL HIGH (ref 0.0–40.0)

## 2011-09-25 LAB — MICROALBUMIN / CREATININE URINE RATIO
Creatinine,U: 191.9 mg/dL
Microalb Creat Ratio: 3.3 mg/g (ref 0.0–30.0)
Microalb, Ur: 6.3 mg/dL — ABNORMAL HIGH (ref 0.0–1.9)

## 2011-09-25 LAB — HEMOGLOBIN A1C: Hgb A1c MFr Bld: 7.3 % — ABNORMAL HIGH (ref 4.6–6.5)

## 2011-09-25 LAB — TSH: TSH: 1.46 u[IU]/mL (ref 0.35–5.50)

## 2011-09-25 NOTE — Assessment & Plan Note (Signed)
The patient did not have enough time to schedule cardiology preop testing including stroke. Patient reports she will arrange cardiology evaluation and she arrives in [place].  We discussed the importance of DVT prophylaxis perioperatively and after knee replacement especially considering her history of bilateral pulmonary embolism.

## 2011-09-25 NOTE — Progress Notes (Signed)
Subjective:    Patient ID: [name], female    DOB: 19-Oct-1934, 77 y.o.   MRN: [id number]  HPI  77 year old white female with history of type 2 diabetes, hypertension and history of pulmonary embolism for followup. Patient is planning to leave tomorrow for [place] to have her knee surgery. She did not have enough time to arrange preop cardiac evaluation. She plans to have cardiology consult when she arrives in [place].                  Diabetes mellitus type 2-her blood sugars have been better controlled recently. Her usual readings are between 110 and 120 in the morning. Occasionally she has a readings in the 150s.  Htn -stable   Review of Systems  Constitutional: Negative for activity change, appetite change and unexpected weight change.  Eyes: Negative for visual disturbance.  Respiratory: Negative for cough Cardiovascular: Negative for chest pain.   Past Medical History  Diagnosis Date  . Unspecified hypothyroidism   . Pure hypercholesterolemia   . Unspecified essential hypertension   . Syncope and collapse   . Type II or unspecified type diabetes mellitus without mention of complication, not stated as uncontrolled   . Depressive disorder, not elsewhere classified   . Extrinsic asthma, unspecified     no problem since adulthood  . Obesity   . Anxiety state, unspecified     panic attacks  . OSA on CPAP     severe  . Allergic rhinitis   . Respiratory failure with hypoxia 09/2008    acute, secondary to multiple bilateral pulmonary embolism , negative hypercoagulable workup 09/2008 hospital stay    History   Social History  . Marital Status: Widowed    Spouse Name: N/A    Number of Children: 2  . Years of Education: N/A   Occupational History  . 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 Smoking status: Former Smoker -- 20 years    Quit date: 09/07/1989  . Smokeless tobacco: Not on file  . Alcohol Use: No  . Drug  Use: No  . Sexually Active:    Other Topics Concern  . Not on file   Social History Narrative  . No narrative on file    Past Surgical History  Procedure Date  . Thyroid surgery     Family History  Problem Relation Age of Onset  . Arthritis Other   . Diabetes Other   . Hyperlipidemia Other   . Hypertension Other   . Coronary artery disease Other   . Stroke Other   . Allergies Mother   . Clotting disorder Mother   . Osteoarthritis Mother   . Asthma Mother   . Osteoarthritis Daughter   . Rheum arthritis Maternal Grandmother   . Clotting disorder Maternal Grandmother   . Clotting disorder Maternal Uncle   . Clotting disorder Daughter   . Allergies Daughter     Allergies  Allergen Reactions  . Advair Hfa   . Aspirin   . Latex   . Lisinopril     REACTION: cough  . Metformin     REACTION: nausea  . Sulfonamide Derivatives   . Tetracycline     Current Outpatient Prescriptions on File Prior to Visit  Medication Sig Dispense Refill  . Cholecalciferol (VITAMIN D) 2000 UNITS CAPS Take 1 capsule by mouth daily.        . Coenzyme Q10 200 MG  capsule Take 200 mg by mouth daily.        . fexofenadine (ALLEGRA) 180 MG tablet Take 180 mg by mouth daily.        . fluticasone (FLONASE) 50 MCG/ACT nasal spray 2 sprays each nostril once daily       . furosemide (LASIX) 20 MG tablet Take 1 tablet (20 mg total) by mouth 2 (two) times daily.  60 tablet  5  . HUMULIN 70/30 (70-30) 100 UNIT/ML injection 70 units in the morning and 70 units in the evening      . Insulin Pen Needle (B-D UF III MINI PEN NEEDLES) 31G X 5 MM MISC Use daily for insulin injection  100 each  2  . levothyroxine (SYNTHROID, LEVOTHROID) 100 MCG tablet Take 100 mcg by mouth daily.        Marland Kitchen losartan (COZAAR) 50 MG tablet Take 1 tablet (50 mg total) by mouth daily. Office visit is needed for future refills  30 tablet  0  . Multiple Vitamin (MULTIVITAMIN) tablet Take 1 tablet by mouth daily.        . ranitidine  (ZANTAC) 150 MG capsule Take 150 mg by mouth 2 (two) times daily as needed.        . sertraline (ZOLOFT) 100 MG tablet Take 1 tablet (100 mg total) by mouth daily.  30 tablet  6    BP 140/70  Pulse 63  Temp(Src) 98.1 F (36.7 C) (Oral)  Wt 254 lb (115.214 kg)       Objective:   Physical Exam  Constitutional: She appears well-developed and well-nourished.  Cardiovascular: Normal rate, regular rhythm and normal heart sounds.   No murmur heard. Pulmonary/Chest: Effort normal and breath sounds normal. She has no wheezes. She has no rales.  Musculoskeletal:       Bilateral +1 lower extremity edema  Skin: Skin is warm and dry.  Psychiatric: She has a normal mood and affect. Her behavior is normal.          Assessment & Plan:

## 2011-09-25 NOTE — Assessment & Plan Note (Signed)
Blood sugar control improve.  Monitor A1c.  Insulin dose adjustments as per endocrinologist - Dr. Suzette Battiest

## 2011-09-25 NOTE — Assessment & Plan Note (Signed)
Stable.  Patient has bilateral +1 lower ext edema.  Patient advised to increase her lasix to 40 mg in AM.  She was advised to have her electrolytes and serum Cr checked within 1 week.

## 2011-09-28 ENCOUNTER — Encounter: Payer: Self-pay | Admitting: Internal Medicine

## 2011-09-29 ENCOUNTER — Ambulatory Visit: Payer: Medicare Other | Admitting: Cardiology

## 2011-10-01 ENCOUNTER — Telehealth: Payer: Self-pay | Admitting: Family Medicine

## 2011-10-01 NOTE — Telephone Encounter (Signed)
Patient wants a copy of her latest bloodwork faxed to her. Please call pt back. Thanks. Fax # [phone]

## 2011-10-05 NOTE — Telephone Encounter (Signed)
Labs faxed

## 2011-10-07 ENCOUNTER — Encounter: Payer: Self-pay | Admitting: Internal Medicine

## 2011-11-03 ENCOUNTER — Telehealth: Payer: Self-pay | Admitting: Internal Medicine

## 2011-11-03 MED ORDER — LOSARTAN POTASSIUM 50 MG PO TABS: 50.0000 mg | ORAL_TABLET | Freq: Every day | ORAL | Status: DC

## 2011-11-03 NOTE — Telephone Encounter (Signed)
Pt is in [place] and is req refill of losartan (COZAAR) 50 MG tablet to [facility] in [place], [place] Phone # [phone]. Pt has taken her last pill. Pls call in today.

## 2011-11-03 NOTE — Telephone Encounter (Signed)
rx sent in electronically 

## 2011-11-17 ENCOUNTER — Encounter: Payer: Self-pay | Admitting: Cardiology

## 2011-12-03 ENCOUNTER — Other Ambulatory Visit: Payer: Self-pay | Admitting: Internal Medicine

## 2011-12-07 ENCOUNTER — Other Ambulatory Visit: Payer: Self-pay | Admitting: *Deleted

## 2011-12-07 MED ORDER — LOSARTAN POTASSIUM 50 MG PO TABS: 50.0000 mg | ORAL_TABLET | Freq: Every day | ORAL | Status: DC

## 2012-03-14 ENCOUNTER — Telehealth: Payer: Self-pay | Admitting: *Deleted

## 2012-03-14 NOTE — Telephone Encounter (Signed)
Patient called to talk to Dr. Niel Hummer about Coumadin that orthopedist put her on after her Total knee replacement on 4/13.  She wants to come here to be monitored for this.  Patient hasnt seen Dr. Marin Olp in a year.  Talked with Dr. Arelia Sneddon about this.  From his standpoint he doesn't need to see patient anymore and stated that if orthopedist put her on coumadin they are the ones who should be monitoring the Coumadin and INR.  Relayed this information to patient She will contact orthopedist office

## 2012-03-24 ENCOUNTER — Telehealth: Payer: Self-pay | Admitting: Hematology & Oncology

## 2012-03-24 NOTE — Telephone Encounter (Signed)
Patient called and spoke with nurse.  Per nurse to schedule pt for lab and md appt.  Pt scheduled appt for 03/25/12

## 2012-03-25 ENCOUNTER — Other Ambulatory Visit: Payer: Medicare Other | Admitting: Lab

## 2012-03-25 ENCOUNTER — Ambulatory Visit: Payer: Medicare Other | Admitting: Hematology & Oncology

## 2012-03-28 ENCOUNTER — Other Ambulatory Visit: Payer: Medicare Other | Admitting: Lab

## 2012-03-28 ENCOUNTER — Other Ambulatory Visit: Payer: Self-pay | Admitting: Hematology & Oncology

## 2012-03-28 ENCOUNTER — Ambulatory Visit (HOSPITAL_BASED_OUTPATIENT_CLINIC_OR_DEPARTMENT_OTHER)
Admission: RE | Admit: 2012-03-28 | Discharge: 2012-03-28 | Disposition: A | Discharge status: Home / Self Care | Payer: Medicare Other | Source: Ambulatory Visit | Attending: Hematology & Oncology | Admitting: Hematology & Oncology

## 2012-03-28 ENCOUNTER — Ambulatory Visit (HOSPITAL_BASED_OUTPATIENT_CLINIC_OR_DEPARTMENT_OTHER): Payer: Medicare Other | Admitting: Hematology & Oncology

## 2012-03-28 VITALS — BP 127/64 | HR 84 | Temp 97.5°F | Ht 63.0 in | Wt 245.0 lb

## 2012-03-28 DIAGNOSIS — Z9889 Other specified postprocedural states: Secondary | ICD-10-CM

## 2012-03-28 DIAGNOSIS — I2699 Other pulmonary embolism without acute cor pulmonale: Secondary | ICD-10-CM

## 2012-03-28 DIAGNOSIS — Z86718 Personal history of other venous thrombosis and embolism: Secondary | ICD-10-CM

## 2012-03-28 DIAGNOSIS — M7989 Other specified soft tissue disorders: Secondary | ICD-10-CM | POA: Insufficient documentation

## 2012-03-28 NOTE — Progress Notes (Signed)
This office note has been dictated.

## 2012-03-28 NOTE — Progress Notes (Signed)
CC:   Jacelyn Pi, M.D. Sandy Salaam. Shawna Orleans, DO  DIAGNOSES: 1. History of idiopathic pulmonary embolism. 2. Recent right knee surgery.  CURRENT THERAPY:  The patient on therapeutic Coumadin postop via orthopedic surgery at [facility] [place].  INTERIM HISTORY:  [name] comes in for a visit.  We last saw her back in May of 2012.  At that point in time, I let her go from the clinic as she had completed 2 years of Coumadin and was doing well.  She now comes back worried about being on Coumadin again.  She had a left knee surgery done at the [facility] back in April.  She says that the orthopedic surgeon wanted her on Coumadin for 3 months.  I was not sure exactly what was going on with her.  As such, I just wanted to make sure she got in so I could see her.  She actually looks quite good.  She is not hurting outside of that associated with the knee surgery itself.  She is doing some physical therapy.  She is doing Coumadin 4.5 mg a day.  She has had no bleeding.  There has been some sinus tenderness.  PHYSICAL EXAMINATION:  General:  This is a well-developed, well- nourished white female in no obvious distress.  Vital signs:  Show a temperature of 97.5, pulse 84, respiratory rate 20, blood pressure 127/64.  Weight is 245.  Head and neck:  Shows a normocephalic, atraumatic skull.  There are no ocular or oral lesions.  There are no palpable cervical or supraclavicular lymph nodes.  Lungs:  Clear bilaterally.  Cardiac:  Regular rate and rhythm with a normal S1, S2. There are no murmurs, rubs or bruits.  Abdomen:  Soft with good bowel sounds.  There is no palpable abdominal mass.  No palpable hepatosplenomegaly.  Extremities:  Shows swelling of the left leg.  She has a well-healed surgical scar on her left knee.  There are some stasis dermatitis changes in the left lower leg.  I cannot palpate any obvious venous cord.  LABS:  Were not done this visit.  Doppler of  her left leg is being done.  IMPRESSION:  [name] is a 77 year old white female with a history of an idiopathic pulmonary embolism.  She completed 2 years of Coumadin in January 2012.  I do want to get a Doppler of her left leg.  The leg seems a little bit more swollen than it should this far out from surgery.  I want to make sure there is not a DVT.  I think this is reasonable given Ms. Eunice history.  We did the Doppler today.  I do not think I need to see Ms. Nash back unless she has a DVT.    ______________________________ Volanda Napoleon, M.D. PRE/MEDQ  D:  03/28/2012  T:  03/28/2012  Job:  [id number]

## 2012-06-07 DIAGNOSIS — R000 Tachycardia, unspecified: Secondary | ICD-10-CM | POA: Insufficient documentation

## 2012-07-19 ENCOUNTER — Other Ambulatory Visit: Payer: Self-pay | Admitting: *Deleted

## 2012-07-19 DIAGNOSIS — R609 Edema, unspecified: Secondary | ICD-10-CM

## 2012-07-19 MED ORDER — FUROSEMIDE 40 MG PO TABS: 40.0000 mg | ORAL_TABLET | Freq: Every day | ORAL | Status: DC

## 2012-07-20 ENCOUNTER — Other Ambulatory Visit: Payer: Self-pay | Admitting: *Deleted

## 2012-11-28 ENCOUNTER — Telehealth: Payer: Self-pay | Admitting: Pulmonary Disease

## 2012-11-28 NOTE — Telephone Encounter (Signed)
I have form from [facility], pt has been seen by Dr Elsworth Soho since 10/14/10.  [facility] has been informed of this and pt will be contacted will need office visit. [name]

## 2012-12-29 ENCOUNTER — Encounter: Payer: Self-pay | Admitting: Pulmonary Disease

## 2012-12-29 ENCOUNTER — Ambulatory Visit (INDEPENDENT_AMBULATORY_CARE_PROVIDER_SITE_OTHER): Payer: Medicare Other | Admitting: Pulmonary Disease

## 2012-12-29 VITALS — BP 142/58 | HR 64 | Temp 97.5°F | Ht 62.0 in | Wt 258.6 lb

## 2012-12-29 DIAGNOSIS — G4730 Sleep apnea, unspecified: Secondary | ICD-10-CM

## 2012-12-29 NOTE — Assessment & Plan Note (Addendum)
CPAP supplies will be renewed There does not appear to be need for nocturnal or daytime O2  Weight loss encouraged, compliance with goal of at least 4-6 hrs every night is the expectation. Advised against medications with sedative side effects Cautioned against driving when sleepy - understanding that sleepiness will vary on a day to day basis

## 2012-12-29 NOTE — Patient Instructions (Addendum)
CPAP supplies will be renewed  

## 2012-12-29 NOTE — Progress Notes (Signed)
  Subjective:    Patient ID: [name], female    DOB: 06/20/1935, 78 y.o.   MRN: [id number]  HPI 77/F with BL idiopathic PE 1/10 for FU of severe obstructive sleep apnea & hypoxia  On CPAP 10 cm PE occurred while on Pro-gon B (hormone supplement, sub lingual)  DIagnostic PSG in 8/09 showed severe obstructive sleep apnea with AHI 85/h, TST 121 mins - no REM sleep. Subsequent titration study showed CPAP requirement of 15 cm with small comfort gel mask Arville Go). However she felt that this pressure was too high & was not very compliant with CPAP.  After PE, oximetry showed desaturation to 77% & oxygen 2 L [place] was started [facility]). Desaturation (without CPAP) could be on the basis of PE or obstructive sleep apnea .  3/10>> autoCPAP 5-15 with 3 L O2 blended in  03/05/09 >> Reviewed ONO >> desatn less than 88% for 35 mins, sustained during ? REM sleep, ct O2 with CPAP during sleep  Hospitalised 10/2-13/11 for pneumonia, hypoxia, CXR - improved BL ASD  download 11/7-15/11 > 10 cm adequate, leak +   reviewed ONO 10/22/10 >> OK to come off O2 at night, ct to use CPAP   12/29/2012  last seen 2012.She has moved to [place], underwent lt knee replacement with good results  Pt states she  needs a new mask for her CPAP. Pt has not used CPAP x 4 month d/t needing supplies. She remains off O2 & stan have stayed good on her own checks   Review of Systems neg for any significant sore throat, dysphagia, itching, sneezing, nasal congestion or excess/ purulent secretions, fever, chills, sweats, unintended wt loss, pleuritic or exertional cp, hempoptysis, orthopnea pnd or change in chronic leg swelling. Also denies presyncope, palpitations, heartburn, abdominal pain, nausea, vomiting, diarrhea or change in bowel or urinary habits, dysuria,hematuria, rash, arthralgias, visual complaints, headache, numbness weakness or ataxia.     Objective:   Physical Exam  Gen. Pleasant, obese, in no distress ENT - no  lesions, no post nasal drip Neck: No JVD, no thyromegaly, no carotid bruits Lungs: no use of accessory muscles, no dullness to percussion, decreased without rales or rhonchi  Cardiovascular: Rhythm regular, heart sounds  normal, no murmurs or gallops, no peripheral edema Musculoskeletal: No deformities, no cyanosis or clubbing , no tremors        Assessment & Plan:

## 2013-03-16 ENCOUNTER — Other Ambulatory Visit: Payer: Self-pay

## 2013-09-22 DIAGNOSIS — E039 Hypothyroidism, unspecified: Secondary | ICD-10-CM | POA: Diagnosis not present

## 2013-09-22 DIAGNOSIS — E119 Type 2 diabetes mellitus without complications: Secondary | ICD-10-CM | POA: Diagnosis not present

## 2013-09-22 DIAGNOSIS — E785 Hyperlipidemia, unspecified: Secondary | ICD-10-CM | POA: Diagnosis not present

## 2013-09-22 DIAGNOSIS — I10 Essential (primary) hypertension: Secondary | ICD-10-CM | POA: Diagnosis not present

## 2013-10-22 DIAGNOSIS — R059 Cough, unspecified: Secondary | ICD-10-CM | POA: Diagnosis not present

## 2013-10-22 DIAGNOSIS — R0602 Shortness of breath: Secondary | ICD-10-CM | POA: Diagnosis not present

## 2013-10-22 DIAGNOSIS — R05 Cough: Secondary | ICD-10-CM | POA: Diagnosis not present

## 2013-10-22 DIAGNOSIS — I10 Essential (primary) hypertension: Secondary | ICD-10-CM | POA: Diagnosis not present

## 2013-10-22 DIAGNOSIS — E119 Type 2 diabetes mellitus without complications: Secondary | ICD-10-CM | POA: Diagnosis not present

## 2013-10-22 DIAGNOSIS — Z87891 Personal history of nicotine dependence: Secondary | ICD-10-CM | POA: Diagnosis not present

## 2013-10-22 DIAGNOSIS — J069 Acute upper respiratory infection, unspecified: Secondary | ICD-10-CM | POA: Diagnosis not present

## 2013-10-22 DIAGNOSIS — E785 Hyperlipidemia, unspecified: Secondary | ICD-10-CM | POA: Diagnosis not present

## 2013-10-25 DIAGNOSIS — J019 Acute sinusitis, unspecified: Secondary | ICD-10-CM | POA: Diagnosis not present

## 2013-10-25 DIAGNOSIS — R059 Cough, unspecified: Secondary | ICD-10-CM | POA: Diagnosis not present

## 2013-10-25 DIAGNOSIS — R05 Cough: Secondary | ICD-10-CM | POA: Diagnosis not present

## 2014-05-08 DIAGNOSIS — M5430 Sciatica, unspecified side: Secondary | ICD-10-CM | POA: Diagnosis not present

## 2014-05-10 DIAGNOSIS — IMO0002 Reserved for concepts with insufficient information to code with codable children: Secondary | ICD-10-CM | POA: Diagnosis not present

## 2014-05-10 DIAGNOSIS — M999 Biomechanical lesion, unspecified: Secondary | ICD-10-CM | POA: Diagnosis not present

## 2014-05-17 DIAGNOSIS — IMO0002 Reserved for concepts with insufficient information to code with codable children: Secondary | ICD-10-CM | POA: Diagnosis not present

## 2014-05-17 DIAGNOSIS — M999 Biomechanical lesion, unspecified: Secondary | ICD-10-CM | POA: Diagnosis not present

## 2014-05-18 DIAGNOSIS — IMO0002 Reserved for concepts with insufficient information to code with codable children: Secondary | ICD-10-CM | POA: Diagnosis not present

## 2014-05-18 DIAGNOSIS — M999 Biomechanical lesion, unspecified: Secondary | ICD-10-CM | POA: Diagnosis not present

## 2014-05-22 DIAGNOSIS — M545 Low back pain, unspecified: Secondary | ICD-10-CM | POA: Diagnosis not present

## 2014-05-22 DIAGNOSIS — M542 Cervicalgia: Secondary | ICD-10-CM | POA: Diagnosis not present

## 2014-05-22 DIAGNOSIS — M999 Biomechanical lesion, unspecified: Secondary | ICD-10-CM | POA: Diagnosis not present

## 2014-05-22 DIAGNOSIS — M9981 Other biomechanical lesions of cervical region: Secondary | ICD-10-CM | POA: Diagnosis not present

## 2014-05-23 DIAGNOSIS — M9981 Other biomechanical lesions of cervical region: Secondary | ICD-10-CM | POA: Diagnosis not present

## 2014-05-23 DIAGNOSIS — M999 Biomechanical lesion, unspecified: Secondary | ICD-10-CM | POA: Diagnosis not present

## 2014-05-23 DIAGNOSIS — M545 Low back pain, unspecified: Secondary | ICD-10-CM | POA: Diagnosis not present

## 2014-05-23 DIAGNOSIS — M542 Cervicalgia: Secondary | ICD-10-CM | POA: Diagnosis not present

## 2014-05-24 DIAGNOSIS — M542 Cervicalgia: Secondary | ICD-10-CM | POA: Diagnosis not present

## 2014-05-24 DIAGNOSIS — I10 Essential (primary) hypertension: Secondary | ICD-10-CM | POA: Diagnosis not present

## 2014-05-24 DIAGNOSIS — M545 Low back pain, unspecified: Secondary | ICD-10-CM | POA: Diagnosis not present

## 2014-05-24 DIAGNOSIS — E039 Hypothyroidism, unspecified: Secondary | ICD-10-CM | POA: Diagnosis not present

## 2014-05-24 DIAGNOSIS — IMO0001 Reserved for inherently not codable concepts without codable children: Secondary | ICD-10-CM | POA: Diagnosis not present

## 2014-05-24 DIAGNOSIS — N390 Urinary tract infection, site not specified: Secondary | ICD-10-CM | POA: Diagnosis not present

## 2014-05-24 DIAGNOSIS — M999 Biomechanical lesion, unspecified: Secondary | ICD-10-CM | POA: Diagnosis not present

## 2014-05-24 DIAGNOSIS — E7800 Pure hypercholesterolemia, unspecified: Secondary | ICD-10-CM | POA: Diagnosis not present

## 2014-05-24 DIAGNOSIS — M9981 Other biomechanical lesions of cervical region: Secondary | ICD-10-CM | POA: Diagnosis not present

## 2014-05-25 DIAGNOSIS — M542 Cervicalgia: Secondary | ICD-10-CM | POA: Diagnosis not present

## 2014-05-25 DIAGNOSIS — M999 Biomechanical lesion, unspecified: Secondary | ICD-10-CM | POA: Diagnosis not present

## 2014-05-25 DIAGNOSIS — M545 Low back pain, unspecified: Secondary | ICD-10-CM | POA: Diagnosis not present

## 2014-05-25 DIAGNOSIS — M9981 Other biomechanical lesions of cervical region: Secondary | ICD-10-CM | POA: Diagnosis not present

## 2014-05-26 DIAGNOSIS — M9981 Other biomechanical lesions of cervical region: Secondary | ICD-10-CM | POA: Diagnosis not present

## 2014-05-26 DIAGNOSIS — M999 Biomechanical lesion, unspecified: Secondary | ICD-10-CM | POA: Diagnosis not present

## 2014-05-26 DIAGNOSIS — M545 Low back pain, unspecified: Secondary | ICD-10-CM | POA: Diagnosis not present

## 2014-05-26 DIAGNOSIS — M542 Cervicalgia: Secondary | ICD-10-CM | POA: Diagnosis not present

## 2014-05-29 DIAGNOSIS — IMO0002 Reserved for concepts with insufficient information to code with codable children: Secondary | ICD-10-CM | POA: Diagnosis not present

## 2014-05-29 DIAGNOSIS — M999 Biomechanical lesion, unspecified: Secondary | ICD-10-CM | POA: Diagnosis not present

## 2014-05-30 DIAGNOSIS — IMO0002 Reserved for concepts with insufficient information to code with codable children: Secondary | ICD-10-CM | POA: Diagnosis not present

## 2014-05-30 DIAGNOSIS — M999 Biomechanical lesion, unspecified: Secondary | ICD-10-CM | POA: Diagnosis not present

## 2014-06-07 DIAGNOSIS — Z221 Carrier of other intestinal infectious diseases: Secondary | ICD-10-CM | POA: Insufficient documentation

## 2014-06-26 DIAGNOSIS — Z6841 Body Mass Index (BMI) 40.0 and over, adult: Secondary | ICD-10-CM | POA: Diagnosis not present

## 2014-06-26 DIAGNOSIS — E1165 Type 2 diabetes mellitus with hyperglycemia: Secondary | ICD-10-CM | POA: Diagnosis present

## 2014-06-26 DIAGNOSIS — Z96652 Presence of left artificial knee joint: Secondary | ICD-10-CM | POA: Diagnosis present

## 2014-06-26 DIAGNOSIS — Z79899 Other long term (current) drug therapy: Secondary | ICD-10-CM | POA: Diagnosis not present

## 2014-06-26 DIAGNOSIS — N136 Pyonephrosis: Secondary | ICD-10-CM | POA: Diagnosis not present

## 2014-06-26 DIAGNOSIS — N179 Acute kidney failure, unspecified: Secondary | ICD-10-CM | POA: Diagnosis present

## 2014-06-26 DIAGNOSIS — Z87891 Personal history of nicotine dependence: Secondary | ICD-10-CM | POA: Diagnosis not present

## 2014-06-26 DIAGNOSIS — R509 Fever, unspecified: Secondary | ICD-10-CM | POA: Diagnosis not present

## 2014-06-26 DIAGNOSIS — M25561 Pain in right knee: Secondary | ICD-10-CM | POA: Diagnosis not present

## 2014-06-26 DIAGNOSIS — E785 Hyperlipidemia, unspecified: Secondary | ICD-10-CM | POA: Diagnosis present

## 2014-06-26 DIAGNOSIS — I214 Non-ST elevation (NSTEMI) myocardial infarction: Secondary | ICD-10-CM | POA: Diagnosis not present

## 2014-06-26 DIAGNOSIS — Z794 Long term (current) use of insulin: Secondary | ICD-10-CM | POA: Diagnosis not present

## 2014-06-26 DIAGNOSIS — Z86711 Personal history of pulmonary embolism: Secondary | ICD-10-CM | POA: Diagnosis not present

## 2014-06-26 DIAGNOSIS — R079 Chest pain, unspecified: Secondary | ICD-10-CM | POA: Diagnosis not present

## 2014-06-26 DIAGNOSIS — Z825 Family history of asthma and other chronic lower respiratory diseases: Secondary | ICD-10-CM | POA: Diagnosis not present

## 2014-06-26 DIAGNOSIS — J438 Other emphysema: Secondary | ICD-10-CM | POA: Diagnosis not present

## 2014-06-26 DIAGNOSIS — R110 Nausea: Secondary | ICD-10-CM | POA: Diagnosis not present

## 2014-06-26 DIAGNOSIS — N390 Urinary tract infection, site not specified: Secondary | ICD-10-CM | POA: Diagnosis not present

## 2014-06-26 DIAGNOSIS — R6520 Severe sepsis without septic shock: Secondary | ICD-10-CM | POA: Diagnosis not present

## 2014-06-26 DIAGNOSIS — N1330 Unspecified hydronephrosis: Secondary | ICD-10-CM | POA: Diagnosis not present

## 2014-06-26 DIAGNOSIS — R109 Unspecified abdominal pain: Secondary | ICD-10-CM | POA: Diagnosis not present

## 2014-06-26 DIAGNOSIS — E039 Hypothyroidism, unspecified: Secondary | ICD-10-CM | POA: Diagnosis present

## 2014-06-26 DIAGNOSIS — Z823 Family history of stroke: Secondary | ICD-10-CM | POA: Diagnosis not present

## 2014-06-26 DIAGNOSIS — Z888 Allergy status to other drugs, medicaments and biological substances status: Secondary | ICD-10-CM | POA: Diagnosis not present

## 2014-06-26 DIAGNOSIS — N201 Calculus of ureter: Secondary | ICD-10-CM | POA: Diagnosis not present

## 2014-06-26 DIAGNOSIS — E118 Type 2 diabetes mellitus with unspecified complications: Secondary | ICD-10-CM | POA: Diagnosis not present

## 2014-06-26 DIAGNOSIS — N200 Calculus of kidney: Secondary | ICD-10-CM | POA: Diagnosis not present

## 2014-06-26 DIAGNOSIS — A419 Sepsis, unspecified organism: Secondary | ICD-10-CM | POA: Diagnosis not present

## 2014-06-26 DIAGNOSIS — Z886 Allergy status to analgesic agent status: Secondary | ICD-10-CM | POA: Diagnosis not present

## 2014-06-26 DIAGNOSIS — R197 Diarrhea, unspecified: Secondary | ICD-10-CM | POA: Diagnosis not present

## 2014-06-26 DIAGNOSIS — R9431 Abnormal electrocardiogram [ECG] [EKG]: Secondary | ICD-10-CM | POA: Diagnosis not present

## 2014-06-26 DIAGNOSIS — A047 Enterocolitis due to Clostridium difficile: Secondary | ICD-10-CM | POA: Diagnosis not present

## 2014-06-26 DIAGNOSIS — I10 Essential (primary) hypertension: Secondary | ICD-10-CM | POA: Diagnosis not present

## 2014-06-26 DIAGNOSIS — R7989 Other specified abnormal findings of blood chemistry: Secondary | ICD-10-CM | POA: Diagnosis not present

## 2014-06-26 DIAGNOSIS — K219 Gastro-esophageal reflux disease without esophagitis: Secondary | ICD-10-CM | POA: Diagnosis present

## 2014-06-26 DIAGNOSIS — G4733 Obstructive sleep apnea (adult) (pediatric): Secondary | ICD-10-CM | POA: Diagnosis present

## 2014-06-26 DIAGNOSIS — N134 Hydroureter: Secondary | ICD-10-CM | POA: Diagnosis not present

## 2014-06-26 DIAGNOSIS — N111 Chronic obstructive pyelonephritis: Secondary | ICD-10-CM | POA: Diagnosis present

## 2014-06-26 DIAGNOSIS — N10 Acute tubulo-interstitial nephritis: Secondary | ICD-10-CM | POA: Diagnosis not present

## 2014-07-05 DIAGNOSIS — R278 Other lack of coordination: Secondary | ICD-10-CM | POA: Diagnosis not present

## 2014-07-05 DIAGNOSIS — R5381 Other malaise: Secondary | ICD-10-CM | POA: Diagnosis not present

## 2014-07-05 DIAGNOSIS — A419 Sepsis, unspecified organism: Secondary | ICD-10-CM | POA: Diagnosis not present

## 2014-07-05 DIAGNOSIS — M6281 Muscle weakness (generalized): Secondary | ICD-10-CM | POA: Diagnosis not present

## 2014-07-06 DIAGNOSIS — I10 Essential (primary) hypertension: Secondary | ICD-10-CM | POA: Diagnosis not present

## 2014-07-06 DIAGNOSIS — L304 Erythema intertrigo: Secondary | ICD-10-CM | POA: Diagnosis not present

## 2014-07-06 DIAGNOSIS — M6281 Muscle weakness (generalized): Secondary | ICD-10-CM | POA: Diagnosis not present

## 2014-07-06 DIAGNOSIS — R609 Edema, unspecified: Secondary | ICD-10-CM | POA: Diagnosis not present

## 2014-07-06 DIAGNOSIS — E119 Type 2 diabetes mellitus without complications: Secondary | ICD-10-CM | POA: Diagnosis not present

## 2014-07-06 DIAGNOSIS — R262 Difficulty in walking, not elsewhere classified: Secondary | ICD-10-CM | POA: Diagnosis not present

## 2014-07-06 DIAGNOSIS — E1121 Type 2 diabetes mellitus with diabetic nephropathy: Secondary | ICD-10-CM | POA: Diagnosis not present

## 2014-07-06 DIAGNOSIS — E1140 Type 2 diabetes mellitus with diabetic neuropathy, unspecified: Secondary | ICD-10-CM | POA: Diagnosis not present

## 2014-07-06 DIAGNOSIS — A047 Enterocolitis due to Clostridium difficile: Secondary | ICD-10-CM | POA: Diagnosis not present

## 2014-07-06 DIAGNOSIS — G4730 Sleep apnea, unspecified: Secondary | ICD-10-CM | POA: Diagnosis not present

## 2014-07-06 DIAGNOSIS — R001 Bradycardia, unspecified: Secondary | ICD-10-CM | POA: Diagnosis not present

## 2014-07-06 DIAGNOSIS — N12 Tubulo-interstitial nephritis, not specified as acute or chronic: Secondary | ICD-10-CM | POA: Diagnosis not present

## 2014-07-06 DIAGNOSIS — N200 Calculus of kidney: Secondary | ICD-10-CM | POA: Diagnosis not present

## 2014-07-06 DIAGNOSIS — R278 Other lack of coordination: Secondary | ICD-10-CM | POA: Diagnosis not present

## 2014-07-06 DIAGNOSIS — A419 Sepsis, unspecified organism: Secondary | ICD-10-CM | POA: Diagnosis not present

## 2014-07-06 DIAGNOSIS — E785 Hyperlipidemia, unspecified: Secondary | ICD-10-CM | POA: Diagnosis not present

## 2014-07-06 DIAGNOSIS — N10 Acute tubulo-interstitial nephritis: Secondary | ICD-10-CM | POA: Diagnosis not present

## 2014-07-13 DIAGNOSIS — R609 Edema, unspecified: Secondary | ICD-10-CM | POA: Diagnosis not present

## 2014-07-13 DIAGNOSIS — R001 Bradycardia, unspecified: Secondary | ICD-10-CM | POA: Diagnosis not present

## 2014-07-13 DIAGNOSIS — E119 Type 2 diabetes mellitus without complications: Secondary | ICD-10-CM | POA: Diagnosis not present

## 2014-07-13 DIAGNOSIS — I10 Essential (primary) hypertension: Secondary | ICD-10-CM | POA: Diagnosis not present

## 2014-07-23 DIAGNOSIS — N200 Calculus of kidney: Secondary | ICD-10-CM | POA: Diagnosis not present

## 2014-07-26 DIAGNOSIS — L304 Erythema intertrigo: Secondary | ICD-10-CM | POA: Diagnosis not present

## 2014-07-26 DIAGNOSIS — E1121 Type 2 diabetes mellitus with diabetic nephropathy: Secondary | ICD-10-CM | POA: Diagnosis not present

## 2014-07-26 DIAGNOSIS — R001 Bradycardia, unspecified: Secondary | ICD-10-CM | POA: Diagnosis not present

## 2014-07-26 DIAGNOSIS — E785 Hyperlipidemia, unspecified: Secondary | ICD-10-CM | POA: Diagnosis not present

## 2014-08-07 DIAGNOSIS — N200 Calculus of kidney: Secondary | ICD-10-CM | POA: Diagnosis not present

## 2014-08-07 DIAGNOSIS — N201 Calculus of ureter: Secondary | ICD-10-CM | POA: Diagnosis not present

## 2014-08-07 DIAGNOSIS — N134 Hydroureter: Secondary | ICD-10-CM | POA: Diagnosis not present

## 2014-08-07 DIAGNOSIS — N390 Urinary tract infection, site not specified: Secondary | ICD-10-CM | POA: Diagnosis not present

## 2014-08-07 DIAGNOSIS — N12 Tubulo-interstitial nephritis, not specified as acute or chronic: Secondary | ICD-10-CM | POA: Diagnosis not present

## 2014-08-07 DIAGNOSIS — N1330 Unspecified hydronephrosis: Secondary | ICD-10-CM | POA: Diagnosis not present

## 2014-08-15 DIAGNOSIS — Y929 Unspecified place or not applicable: Secondary | ICD-10-CM | POA: Diagnosis not present

## 2014-08-15 DIAGNOSIS — W1830XA Fall on same level, unspecified, initial encounter: Secondary | ICD-10-CM | POA: Diagnosis not present

## 2014-08-15 DIAGNOSIS — S0990XA Unspecified injury of head, initial encounter: Secondary | ICD-10-CM | POA: Diagnosis not present

## 2014-08-15 DIAGNOSIS — S0003XA Contusion of scalp, initial encounter: Secondary | ICD-10-CM | POA: Diagnosis not present

## 2014-08-27 DIAGNOSIS — N200 Calculus of kidney: Secondary | ICD-10-CM | POA: Diagnosis not present

## 2014-08-29 DIAGNOSIS — M25561 Pain in right knee: Secondary | ICD-10-CM | POA: Diagnosis not present

## 2014-08-29 DIAGNOSIS — R262 Difficulty in walking, not elsewhere classified: Secondary | ICD-10-CM | POA: Diagnosis not present

## 2014-08-29 DIAGNOSIS — N200 Calculus of kidney: Secondary | ICD-10-CM | POA: Diagnosis not present

## 2014-08-29 DIAGNOSIS — E1165 Type 2 diabetes mellitus with hyperglycemia: Secondary | ICD-10-CM | POA: Diagnosis not present

## 2014-08-29 DIAGNOSIS — I509 Heart failure, unspecified: Secondary | ICD-10-CM | POA: Diagnosis not present

## 2014-08-29 DIAGNOSIS — I222 Subsequent non-ST elevation (NSTEMI) myocardial infarction: Secondary | ICD-10-CM | POA: Diagnosis not present

## 2014-08-29 DIAGNOSIS — E119 Type 2 diabetes mellitus without complications: Secondary | ICD-10-CM | POA: Diagnosis not present

## 2014-08-29 DIAGNOSIS — M6281 Muscle weakness (generalized): Secondary | ICD-10-CM | POA: Diagnosis not present

## 2014-09-03 DIAGNOSIS — I509 Heart failure, unspecified: Secondary | ICD-10-CM | POA: Diagnosis not present

## 2014-09-03 DIAGNOSIS — M6281 Muscle weakness (generalized): Secondary | ICD-10-CM | POA: Diagnosis not present

## 2014-09-03 DIAGNOSIS — E119 Type 2 diabetes mellitus without complications: Secondary | ICD-10-CM | POA: Diagnosis not present

## 2014-09-03 DIAGNOSIS — M25561 Pain in right knee: Secondary | ICD-10-CM | POA: Diagnosis not present

## 2014-09-03 DIAGNOSIS — R262 Difficulty in walking, not elsewhere classified: Secondary | ICD-10-CM | POA: Diagnosis not present

## 2014-09-05 DIAGNOSIS — M6281 Muscle weakness (generalized): Secondary | ICD-10-CM | POA: Diagnosis not present

## 2014-09-05 DIAGNOSIS — E119 Type 2 diabetes mellitus without complications: Secondary | ICD-10-CM | POA: Diagnosis not present

## 2014-09-05 DIAGNOSIS — R262 Difficulty in walking, not elsewhere classified: Secondary | ICD-10-CM | POA: Diagnosis not present

## 2014-09-05 DIAGNOSIS — M25561 Pain in right knee: Secondary | ICD-10-CM | POA: Diagnosis not present

## 2014-09-05 DIAGNOSIS — I509 Heart failure, unspecified: Secondary | ICD-10-CM | POA: Diagnosis not present

## 2014-09-10 DIAGNOSIS — E119 Type 2 diabetes mellitus without complications: Secondary | ICD-10-CM | POA: Diagnosis not present

## 2014-09-10 DIAGNOSIS — M6281 Muscle weakness (generalized): Secondary | ICD-10-CM | POA: Diagnosis not present

## 2014-09-10 DIAGNOSIS — I509 Heart failure, unspecified: Secondary | ICD-10-CM | POA: Diagnosis not present

## 2014-09-10 DIAGNOSIS — R262 Difficulty in walking, not elsewhere classified: Secondary | ICD-10-CM | POA: Diagnosis not present

## 2014-09-10 DIAGNOSIS — M25561 Pain in right knee: Secondary | ICD-10-CM | POA: Diagnosis not present

## 2014-09-11 DIAGNOSIS — E1165 Type 2 diabetes mellitus with hyperglycemia: Secondary | ICD-10-CM | POA: Diagnosis not present

## 2014-09-11 DIAGNOSIS — I10 Essential (primary) hypertension: Secondary | ICD-10-CM | POA: Diagnosis not present

## 2014-09-11 DIAGNOSIS — E785 Hyperlipidemia, unspecified: Secondary | ICD-10-CM | POA: Diagnosis not present

## 2014-09-11 DIAGNOSIS — E038 Other specified hypothyroidism: Secondary | ICD-10-CM | POA: Diagnosis not present

## 2014-09-14 DIAGNOSIS — E119 Type 2 diabetes mellitus without complications: Secondary | ICD-10-CM | POA: Diagnosis not present

## 2014-09-14 DIAGNOSIS — M25561 Pain in right knee: Secondary | ICD-10-CM | POA: Diagnosis not present

## 2014-09-14 DIAGNOSIS — I509 Heart failure, unspecified: Secondary | ICD-10-CM | POA: Diagnosis not present

## 2014-09-14 DIAGNOSIS — M6281 Muscle weakness (generalized): Secondary | ICD-10-CM | POA: Diagnosis not present

## 2014-09-14 DIAGNOSIS — R262 Difficulty in walking, not elsewhere classified: Secondary | ICD-10-CM | POA: Diagnosis not present

## 2014-09-17 DIAGNOSIS — M1712 Unilateral primary osteoarthritis, left knee: Secondary | ICD-10-CM | POA: Insufficient documentation

## 2014-09-18 DIAGNOSIS — M6281 Muscle weakness (generalized): Secondary | ICD-10-CM | POA: Diagnosis not present

## 2014-09-18 DIAGNOSIS — I509 Heart failure, unspecified: Secondary | ICD-10-CM | POA: Diagnosis not present

## 2014-09-18 DIAGNOSIS — R262 Difficulty in walking, not elsewhere classified: Secondary | ICD-10-CM | POA: Diagnosis not present

## 2014-09-18 DIAGNOSIS — E119 Type 2 diabetes mellitus without complications: Secondary | ICD-10-CM | POA: Diagnosis not present

## 2014-09-18 DIAGNOSIS — M25561 Pain in right knee: Secondary | ICD-10-CM | POA: Diagnosis not present

## 2014-09-19 DIAGNOSIS — N159 Renal tubulo-interstitial disease, unspecified: Secondary | ICD-10-CM | POA: Diagnosis not present

## 2014-09-19 DIAGNOSIS — I509 Heart failure, unspecified: Secondary | ICD-10-CM | POA: Diagnosis not present

## 2014-09-19 DIAGNOSIS — Z96659 Presence of unspecified artificial knee joint: Secondary | ICD-10-CM | POA: Diagnosis not present

## 2014-09-19 DIAGNOSIS — Z466 Encounter for fitting and adjustment of urinary device: Secondary | ICD-10-CM | POA: Diagnosis not present

## 2014-09-19 DIAGNOSIS — M25511 Pain in right shoulder: Secondary | ICD-10-CM | POA: Diagnosis not present

## 2014-09-19 DIAGNOSIS — Z794 Long term (current) use of insulin: Secondary | ICD-10-CM | POA: Diagnosis not present

## 2014-09-19 DIAGNOSIS — E785 Hyperlipidemia, unspecified: Secondary | ICD-10-CM | POA: Diagnosis not present

## 2014-09-19 DIAGNOSIS — N179 Acute kidney failure, unspecified: Secondary | ICD-10-CM | POA: Diagnosis not present

## 2014-09-19 DIAGNOSIS — I10 Essential (primary) hypertension: Secondary | ICD-10-CM | POA: Diagnosis not present

## 2014-09-19 DIAGNOSIS — E1165 Type 2 diabetes mellitus with hyperglycemia: Secondary | ICD-10-CM | POA: Diagnosis not present

## 2014-09-19 DIAGNOSIS — Z886 Allergy status to analgesic agent status: Secondary | ICD-10-CM | POA: Diagnosis not present

## 2014-09-19 DIAGNOSIS — N200 Calculus of kidney: Secondary | ICD-10-CM | POA: Diagnosis not present

## 2014-09-19 DIAGNOSIS — G4730 Sleep apnea, unspecified: Secondary | ICD-10-CM | POA: Diagnosis not present

## 2014-09-19 DIAGNOSIS — F419 Anxiety disorder, unspecified: Secondary | ICD-10-CM | POA: Diagnosis not present

## 2014-09-19 DIAGNOSIS — E039 Hypothyroidism, unspecified: Secondary | ICD-10-CM | POA: Diagnosis not present

## 2014-09-19 DIAGNOSIS — N136 Pyonephrosis: Secondary | ICD-10-CM | POA: Diagnosis not present

## 2014-09-19 DIAGNOSIS — N2889 Other specified disorders of kidney and ureter: Secondary | ICD-10-CM | POA: Diagnosis not present

## 2014-09-19 DIAGNOSIS — N390 Urinary tract infection, site not specified: Secondary | ICD-10-CM | POA: Diagnosis not present

## 2014-09-19 DIAGNOSIS — I313 Pericardial effusion (noninflammatory): Secondary | ICD-10-CM | POA: Diagnosis not present

## 2014-09-19 DIAGNOSIS — I252 Old myocardial infarction: Secondary | ICD-10-CM | POA: Diagnosis not present

## 2014-09-19 DIAGNOSIS — Z87891 Personal history of nicotine dependence: Secondary | ICD-10-CM | POA: Diagnosis not present

## 2014-09-19 DIAGNOSIS — Z6841 Body Mass Index (BMI) 40.0 and over, adult: Secondary | ICD-10-CM | POA: Diagnosis not present

## 2014-09-19 DIAGNOSIS — K219 Gastro-esophageal reflux disease without esophagitis: Secondary | ICD-10-CM | POA: Diagnosis not present

## 2014-09-19 DIAGNOSIS — R9431 Abnormal electrocardiogram [ECG] [EKG]: Secondary | ICD-10-CM | POA: Diagnosis not present

## 2014-09-20 DIAGNOSIS — N200 Calculus of kidney: Secondary | ICD-10-CM | POA: Diagnosis not present

## 2014-09-20 DIAGNOSIS — E785 Hyperlipidemia, unspecified: Secondary | ICD-10-CM | POA: Diagnosis not present

## 2014-09-20 DIAGNOSIS — N136 Pyonephrosis: Secondary | ICD-10-CM | POA: Diagnosis not present

## 2014-09-20 DIAGNOSIS — N390 Urinary tract infection, site not specified: Secondary | ICD-10-CM | POA: Diagnosis not present

## 2014-09-20 DIAGNOSIS — Z466 Encounter for fitting and adjustment of urinary device: Secondary | ICD-10-CM | POA: Diagnosis not present

## 2014-09-20 DIAGNOSIS — I509 Heart failure, unspecified: Secondary | ICD-10-CM | POA: Diagnosis not present

## 2014-09-20 DIAGNOSIS — I10 Essential (primary) hypertension: Secondary | ICD-10-CM | POA: Diagnosis not present

## 2014-09-21 DIAGNOSIS — I10 Essential (primary) hypertension: Secondary | ICD-10-CM | POA: Diagnosis not present

## 2014-09-21 DIAGNOSIS — E785 Hyperlipidemia, unspecified: Secondary | ICD-10-CM | POA: Diagnosis not present

## 2014-09-21 DIAGNOSIS — N390 Urinary tract infection, site not specified: Secondary | ICD-10-CM | POA: Diagnosis not present

## 2014-09-21 DIAGNOSIS — N136 Pyonephrosis: Secondary | ICD-10-CM | POA: Diagnosis not present

## 2014-09-21 DIAGNOSIS — N1330 Unspecified hydronephrosis: Secondary | ICD-10-CM | POA: Diagnosis not present

## 2014-09-21 DIAGNOSIS — Z466 Encounter for fitting and adjustment of urinary device: Secondary | ICD-10-CM | POA: Diagnosis not present

## 2014-09-21 DIAGNOSIS — I509 Heart failure, unspecified: Secondary | ICD-10-CM | POA: Diagnosis not present

## 2014-09-21 DIAGNOSIS — N2889 Other specified disorders of kidney and ureter: Secondary | ICD-10-CM | POA: Diagnosis not present

## 2014-09-21 DIAGNOSIS — N200 Calculus of kidney: Secondary | ICD-10-CM | POA: Diagnosis not present

## 2014-09-22 DIAGNOSIS — N136 Pyonephrosis: Secondary | ICD-10-CM | POA: Diagnosis not present

## 2014-09-22 DIAGNOSIS — I10 Essential (primary) hypertension: Secondary | ICD-10-CM | POA: Diagnosis not present

## 2014-09-22 DIAGNOSIS — N2889 Other specified disorders of kidney and ureter: Secondary | ICD-10-CM | POA: Diagnosis not present

## 2014-09-22 DIAGNOSIS — Z466 Encounter for fitting and adjustment of urinary device: Secondary | ICD-10-CM | POA: Diagnosis not present

## 2014-09-22 DIAGNOSIS — N1330 Unspecified hydronephrosis: Secondary | ICD-10-CM | POA: Diagnosis not present

## 2014-09-22 DIAGNOSIS — N200 Calculus of kidney: Secondary | ICD-10-CM | POA: Diagnosis not present

## 2014-09-22 DIAGNOSIS — I509 Heart failure, unspecified: Secondary | ICD-10-CM | POA: Diagnosis not present

## 2014-09-22 DIAGNOSIS — E785 Hyperlipidemia, unspecified: Secondary | ICD-10-CM | POA: Diagnosis not present

## 2014-09-22 DIAGNOSIS — N390 Urinary tract infection, site not specified: Secondary | ICD-10-CM | POA: Diagnosis not present

## 2014-09-23 DIAGNOSIS — I10 Essential (primary) hypertension: Secondary | ICD-10-CM | POA: Diagnosis not present

## 2014-09-23 DIAGNOSIS — N200 Calculus of kidney: Secondary | ICD-10-CM | POA: Diagnosis not present

## 2014-09-23 DIAGNOSIS — N390 Urinary tract infection, site not specified: Secondary | ICD-10-CM | POA: Diagnosis not present

## 2014-09-23 DIAGNOSIS — E785 Hyperlipidemia, unspecified: Secondary | ICD-10-CM | POA: Diagnosis not present

## 2014-09-23 DIAGNOSIS — I509 Heart failure, unspecified: Secondary | ICD-10-CM | POA: Diagnosis not present

## 2014-09-23 DIAGNOSIS — B379 Candidiasis, unspecified: Secondary | ICD-10-CM | POA: Diagnosis not present

## 2014-09-23 DIAGNOSIS — Z466 Encounter for fitting and adjustment of urinary device: Secondary | ICD-10-CM | POA: Diagnosis not present

## 2014-09-23 DIAGNOSIS — N136 Pyonephrosis: Secondary | ICD-10-CM | POA: Diagnosis not present

## 2014-09-26 ENCOUNTER — Encounter: Payer: Self-pay | Admitting: Gastroenterology

## 2014-09-28 DIAGNOSIS — N200 Calculus of kidney: Secondary | ICD-10-CM | POA: Diagnosis not present

## 2014-09-28 DIAGNOSIS — E1165 Type 2 diabetes mellitus with hyperglycemia: Secondary | ICD-10-CM | POA: Diagnosis not present

## 2014-10-03 DIAGNOSIS — Z96652 Presence of left artificial knee joint: Secondary | ICD-10-CM | POA: Diagnosis not present

## 2014-10-03 DIAGNOSIS — M1712 Unilateral primary osteoarthritis, left knee: Secondary | ICD-10-CM | POA: Diagnosis not present

## 2014-10-09 DIAGNOSIS — L253 Unspecified contact dermatitis due to other chemical products: Secondary | ICD-10-CM | POA: Insufficient documentation

## 2014-10-09 DIAGNOSIS — L989 Disorder of the skin and subcutaneous tissue, unspecified: Secondary | ICD-10-CM | POA: Diagnosis not present

## 2014-10-09 DIAGNOSIS — J3489 Other specified disorders of nose and nasal sinuses: Secondary | ICD-10-CM | POA: Diagnosis not present

## 2014-10-09 DIAGNOSIS — R040 Epistaxis: Secondary | ICD-10-CM | POA: Diagnosis not present

## 2014-10-11 DIAGNOSIS — R5381 Other malaise: Secondary | ICD-10-CM | POA: Insufficient documentation

## 2014-10-12 DIAGNOSIS — I509 Heart failure, unspecified: Secondary | ICD-10-CM | POA: Diagnosis not present

## 2014-10-12 DIAGNOSIS — I10 Essential (primary) hypertension: Secondary | ICD-10-CM | POA: Diagnosis not present

## 2014-10-12 DIAGNOSIS — I214 Non-ST elevation (NSTEMI) myocardial infarction: Secondary | ICD-10-CM | POA: Diagnosis not present

## 2014-10-12 DIAGNOSIS — G4733 Obstructive sleep apnea (adult) (pediatric): Secondary | ICD-10-CM | POA: Diagnosis not present

## 2014-10-12 DIAGNOSIS — Z794 Long term (current) use of insulin: Secondary | ICD-10-CM | POA: Diagnosis not present

## 2014-10-12 DIAGNOSIS — F419 Anxiety disorder, unspecified: Secondary | ICD-10-CM | POA: Diagnosis not present

## 2014-10-12 DIAGNOSIS — Z6841 Body Mass Index (BMI) 40.0 and over, adult: Secondary | ICD-10-CM | POA: Diagnosis not present

## 2014-10-12 DIAGNOSIS — I4891 Unspecified atrial fibrillation: Secondary | ICD-10-CM | POA: Diagnosis not present

## 2014-10-12 DIAGNOSIS — E669 Obesity, unspecified: Secondary | ICD-10-CM | POA: Diagnosis not present

## 2014-10-12 DIAGNOSIS — K219 Gastro-esophageal reflux disease without esophagitis: Secondary | ICD-10-CM | POA: Diagnosis not present

## 2014-10-12 DIAGNOSIS — N189 Chronic kidney disease, unspecified: Secondary | ICD-10-CM | POA: Diagnosis not present

## 2014-10-12 DIAGNOSIS — N139 Obstructive and reflux uropathy, unspecified: Secondary | ICD-10-CM | POA: Diagnosis not present

## 2014-10-12 DIAGNOSIS — E1165 Type 2 diabetes mellitus with hyperglycemia: Secondary | ICD-10-CM | POA: Diagnosis not present

## 2014-10-12 DIAGNOSIS — E039 Hypothyroidism, unspecified: Secondary | ICD-10-CM | POA: Diagnosis not present

## 2014-10-12 DIAGNOSIS — N200 Calculus of kidney: Secondary | ICD-10-CM | POA: Diagnosis not present

## 2014-10-12 DIAGNOSIS — N209 Urinary calculus, unspecified: Secondary | ICD-10-CM | POA: Diagnosis not present

## 2014-10-12 DIAGNOSIS — E785 Hyperlipidemia, unspecified: Secondary | ICD-10-CM | POA: Diagnosis not present

## 2014-10-12 DIAGNOSIS — Z87891 Personal history of nicotine dependence: Secondary | ICD-10-CM | POA: Diagnosis not present

## 2014-10-12 DIAGNOSIS — E119 Type 2 diabetes mellitus without complications: Secondary | ICD-10-CM | POA: Diagnosis not present

## 2014-10-13 DIAGNOSIS — I10 Essential (primary) hypertension: Secondary | ICD-10-CM | POA: Diagnosis not present

## 2014-10-13 DIAGNOSIS — N200 Calculus of kidney: Secondary | ICD-10-CM | POA: Diagnosis not present

## 2014-10-13 DIAGNOSIS — E785 Hyperlipidemia, unspecified: Secondary | ICD-10-CM | POA: Diagnosis not present

## 2014-10-13 DIAGNOSIS — G4733 Obstructive sleep apnea (adult) (pediatric): Secondary | ICD-10-CM | POA: Diagnosis not present

## 2014-10-13 DIAGNOSIS — F419 Anxiety disorder, unspecified: Secondary | ICD-10-CM | POA: Diagnosis not present

## 2014-10-13 DIAGNOSIS — N139 Obstructive and reflux uropathy, unspecified: Secondary | ICD-10-CM | POA: Diagnosis not present

## 2014-10-13 DIAGNOSIS — E119 Type 2 diabetes mellitus without complications: Secondary | ICD-10-CM | POA: Diagnosis not present

## 2014-10-13 DIAGNOSIS — N132 Hydronephrosis with renal and ureteral calculous obstruction: Secondary | ICD-10-CM | POA: Diagnosis not present

## 2014-10-14 DIAGNOSIS — N390 Urinary tract infection, site not specified: Secondary | ICD-10-CM | POA: Diagnosis not present

## 2014-10-14 DIAGNOSIS — E785 Hyperlipidemia, unspecified: Secondary | ICD-10-CM | POA: Diagnosis not present

## 2014-10-14 DIAGNOSIS — N200 Calculus of kidney: Secondary | ICD-10-CM | POA: Diagnosis not present

## 2014-10-14 DIAGNOSIS — F419 Anxiety disorder, unspecified: Secondary | ICD-10-CM | POA: Diagnosis not present

## 2014-10-14 DIAGNOSIS — I10 Essential (primary) hypertension: Secondary | ICD-10-CM | POA: Diagnosis not present

## 2014-10-14 DIAGNOSIS — G4733 Obstructive sleep apnea (adult) (pediatric): Secondary | ICD-10-CM | POA: Diagnosis not present

## 2014-10-14 DIAGNOSIS — R600 Localized edema: Secondary | ICD-10-CM | POA: Diagnosis not present

## 2014-10-14 DIAGNOSIS — E119 Type 2 diabetes mellitus without complications: Secondary | ICD-10-CM | POA: Diagnosis not present

## 2014-10-14 DIAGNOSIS — N139 Obstructive and reflux uropathy, unspecified: Secondary | ICD-10-CM | POA: Diagnosis not present

## 2014-10-14 DIAGNOSIS — N132 Hydronephrosis with renal and ureteral calculous obstruction: Secondary | ICD-10-CM | POA: Diagnosis not present

## 2014-10-15 DIAGNOSIS — N200 Calculus of kidney: Secondary | ICD-10-CM | POA: Diagnosis not present

## 2014-10-15 DIAGNOSIS — R489 Unspecified symbolic dysfunctions: Secondary | ICD-10-CM | POA: Diagnosis not present

## 2014-10-15 DIAGNOSIS — F419 Anxiety disorder, unspecified: Secondary | ICD-10-CM | POA: Diagnosis not present

## 2014-10-15 DIAGNOSIS — R600 Localized edema: Secondary | ICD-10-CM | POA: Diagnosis not present

## 2014-10-15 DIAGNOSIS — E119 Type 2 diabetes mellitus without complications: Secondary | ICD-10-CM | POA: Diagnosis not present

## 2014-10-15 DIAGNOSIS — G4733 Obstructive sleep apnea (adult) (pediatric): Secondary | ICD-10-CM | POA: Diagnosis not present

## 2014-10-15 DIAGNOSIS — R278 Other lack of coordination: Secondary | ICD-10-CM | POA: Diagnosis not present

## 2014-10-15 DIAGNOSIS — N132 Hydronephrosis with renal and ureteral calculous obstruction: Secondary | ICD-10-CM | POA: Diagnosis not present

## 2014-10-15 DIAGNOSIS — N139 Obstructive and reflux uropathy, unspecified: Secondary | ICD-10-CM | POA: Diagnosis not present

## 2014-10-15 DIAGNOSIS — I10 Essential (primary) hypertension: Secondary | ICD-10-CM | POA: Diagnosis not present

## 2014-10-15 DIAGNOSIS — M6281 Muscle weakness (generalized): Secondary | ICD-10-CM | POA: Diagnosis not present

## 2014-10-15 DIAGNOSIS — N390 Urinary tract infection, site not specified: Secondary | ICD-10-CM | POA: Diagnosis not present

## 2014-10-15 DIAGNOSIS — R2681 Unsteadiness on feet: Secondary | ICD-10-CM | POA: Diagnosis not present

## 2014-10-15 DIAGNOSIS — Z960 Presence of urogenital implants: Secondary | ICD-10-CM | POA: Diagnosis not present

## 2014-10-15 DIAGNOSIS — E785 Hyperlipidemia, unspecified: Secondary | ICD-10-CM | POA: Diagnosis not present

## 2014-10-15 DIAGNOSIS — A419 Sepsis, unspecified organism: Secondary | ICD-10-CM | POA: Diagnosis not present

## 2014-10-16 DIAGNOSIS — R278 Other lack of coordination: Secondary | ICD-10-CM | POA: Diagnosis not present

## 2014-10-16 DIAGNOSIS — Z960 Presence of urogenital implants: Secondary | ICD-10-CM | POA: Diagnosis not present

## 2014-10-16 DIAGNOSIS — N200 Calculus of kidney: Secondary | ICD-10-CM | POA: Diagnosis not present

## 2014-10-16 DIAGNOSIS — M6281 Muscle weakness (generalized): Secondary | ICD-10-CM | POA: Diagnosis not present

## 2014-10-16 DIAGNOSIS — N390 Urinary tract infection, site not specified: Secondary | ICD-10-CM | POA: Diagnosis not present

## 2014-10-16 DIAGNOSIS — I10 Essential (primary) hypertension: Secondary | ICD-10-CM | POA: Diagnosis not present

## 2014-10-16 DIAGNOSIS — R2681 Unsteadiness on feet: Secondary | ICD-10-CM | POA: Diagnosis not present

## 2014-10-16 DIAGNOSIS — R489 Unspecified symbolic dysfunctions: Secondary | ICD-10-CM | POA: Diagnosis not present

## 2014-10-16 DIAGNOSIS — A419 Sepsis, unspecified organism: Secondary | ICD-10-CM | POA: Diagnosis not present

## 2014-10-17 DIAGNOSIS — R2681 Unsteadiness on feet: Secondary | ICD-10-CM | POA: Diagnosis not present

## 2014-10-17 DIAGNOSIS — R278 Other lack of coordination: Secondary | ICD-10-CM | POA: Diagnosis not present

## 2014-10-17 DIAGNOSIS — R489 Unspecified symbolic dysfunctions: Secondary | ICD-10-CM | POA: Diagnosis not present

## 2014-10-17 DIAGNOSIS — Z960 Presence of urogenital implants: Secondary | ICD-10-CM | POA: Diagnosis not present

## 2014-10-17 DIAGNOSIS — M6281 Muscle weakness (generalized): Secondary | ICD-10-CM | POA: Diagnosis not present

## 2014-10-17 DIAGNOSIS — A419 Sepsis, unspecified organism: Secondary | ICD-10-CM | POA: Diagnosis not present

## 2014-10-19 DIAGNOSIS — M6281 Muscle weakness (generalized): Secondary | ICD-10-CM | POA: Diagnosis not present

## 2014-10-19 DIAGNOSIS — Z960 Presence of urogenital implants: Secondary | ICD-10-CM | POA: Diagnosis not present

## 2014-10-19 DIAGNOSIS — A419 Sepsis, unspecified organism: Secondary | ICD-10-CM | POA: Diagnosis not present

## 2014-10-19 DIAGNOSIS — R278 Other lack of coordination: Secondary | ICD-10-CM | POA: Diagnosis not present

## 2014-10-19 DIAGNOSIS — R489 Unspecified symbolic dysfunctions: Secondary | ICD-10-CM | POA: Diagnosis not present

## 2014-10-19 DIAGNOSIS — R2681 Unsteadiness on feet: Secondary | ICD-10-CM | POA: Diagnosis not present

## 2014-10-19 DIAGNOSIS — N3289 Other specified disorders of bladder: Secondary | ICD-10-CM | POA: Diagnosis not present

## 2014-10-22 DIAGNOSIS — R2681 Unsteadiness on feet: Secondary | ICD-10-CM | POA: Diagnosis not present

## 2014-10-22 DIAGNOSIS — A419 Sepsis, unspecified organism: Secondary | ICD-10-CM | POA: Diagnosis not present

## 2014-10-22 DIAGNOSIS — M6281 Muscle weakness (generalized): Secondary | ICD-10-CM | POA: Diagnosis not present

## 2014-10-22 DIAGNOSIS — R278 Other lack of coordination: Secondary | ICD-10-CM | POA: Diagnosis not present

## 2014-10-22 DIAGNOSIS — Z960 Presence of urogenital implants: Secondary | ICD-10-CM | POA: Diagnosis not present

## 2014-10-22 DIAGNOSIS — R489 Unspecified symbolic dysfunctions: Secondary | ICD-10-CM | POA: Diagnosis not present

## 2014-10-23 DIAGNOSIS — R2681 Unsteadiness on feet: Secondary | ICD-10-CM | POA: Diagnosis not present

## 2014-10-23 DIAGNOSIS — Z960 Presence of urogenital implants: Secondary | ICD-10-CM | POA: Diagnosis not present

## 2014-10-23 DIAGNOSIS — R278 Other lack of coordination: Secondary | ICD-10-CM | POA: Diagnosis not present

## 2014-10-23 DIAGNOSIS — A419 Sepsis, unspecified organism: Secondary | ICD-10-CM | POA: Diagnosis not present

## 2014-10-23 DIAGNOSIS — R489 Unspecified symbolic dysfunctions: Secondary | ICD-10-CM | POA: Diagnosis not present

## 2014-10-23 DIAGNOSIS — M6281 Muscle weakness (generalized): Secondary | ICD-10-CM | POA: Diagnosis not present

## 2014-10-24 DIAGNOSIS — R2681 Unsteadiness on feet: Secondary | ICD-10-CM | POA: Diagnosis not present

## 2014-10-24 DIAGNOSIS — M6281 Muscle weakness (generalized): Secondary | ICD-10-CM | POA: Diagnosis not present

## 2014-10-24 DIAGNOSIS — R489 Unspecified symbolic dysfunctions: Secondary | ICD-10-CM | POA: Diagnosis not present

## 2014-10-24 DIAGNOSIS — R278 Other lack of coordination: Secondary | ICD-10-CM | POA: Diagnosis not present

## 2014-10-24 DIAGNOSIS — Z960 Presence of urogenital implants: Secondary | ICD-10-CM | POA: Diagnosis not present

## 2014-10-24 DIAGNOSIS — A419 Sepsis, unspecified organism: Secondary | ICD-10-CM | POA: Diagnosis not present

## 2014-10-25 DIAGNOSIS — Z960 Presence of urogenital implants: Secondary | ICD-10-CM | POA: Diagnosis not present

## 2014-10-25 DIAGNOSIS — R278 Other lack of coordination: Secondary | ICD-10-CM | POA: Diagnosis not present

## 2014-10-25 DIAGNOSIS — M6281 Muscle weakness (generalized): Secondary | ICD-10-CM | POA: Diagnosis not present

## 2014-10-25 DIAGNOSIS — R2681 Unsteadiness on feet: Secondary | ICD-10-CM | POA: Diagnosis not present

## 2014-10-25 DIAGNOSIS — R489 Unspecified symbolic dysfunctions: Secondary | ICD-10-CM | POA: Diagnosis not present

## 2014-10-25 DIAGNOSIS — A419 Sepsis, unspecified organism: Secondary | ICD-10-CM | POA: Diagnosis not present

## 2014-10-26 DIAGNOSIS — R278 Other lack of coordination: Secondary | ICD-10-CM | POA: Diagnosis not present

## 2014-10-26 DIAGNOSIS — Z960 Presence of urogenital implants: Secondary | ICD-10-CM | POA: Diagnosis not present

## 2014-10-26 DIAGNOSIS — A419 Sepsis, unspecified organism: Secondary | ICD-10-CM | POA: Diagnosis not present

## 2014-10-26 DIAGNOSIS — R489 Unspecified symbolic dysfunctions: Secondary | ICD-10-CM | POA: Diagnosis not present

## 2014-10-26 DIAGNOSIS — M6281 Muscle weakness (generalized): Secondary | ICD-10-CM | POA: Diagnosis not present

## 2014-10-26 DIAGNOSIS — R2681 Unsteadiness on feet: Secondary | ICD-10-CM | POA: Diagnosis not present

## 2014-10-29 DIAGNOSIS — A419 Sepsis, unspecified organism: Secondary | ICD-10-CM | POA: Diagnosis not present

## 2014-10-29 DIAGNOSIS — Z960 Presence of urogenital implants: Secondary | ICD-10-CM | POA: Diagnosis not present

## 2014-10-29 DIAGNOSIS — R2681 Unsteadiness on feet: Secondary | ICD-10-CM | POA: Diagnosis not present

## 2014-10-29 DIAGNOSIS — N200 Calculus of kidney: Secondary | ICD-10-CM | POA: Diagnosis not present

## 2014-10-29 DIAGNOSIS — R278 Other lack of coordination: Secondary | ICD-10-CM | POA: Diagnosis not present

## 2014-10-29 DIAGNOSIS — R489 Unspecified symbolic dysfunctions: Secondary | ICD-10-CM | POA: Diagnosis not present

## 2014-10-29 DIAGNOSIS — M6281 Muscle weakness (generalized): Secondary | ICD-10-CM | POA: Diagnosis not present

## 2014-10-30 DIAGNOSIS — R489 Unspecified symbolic dysfunctions: Secondary | ICD-10-CM | POA: Diagnosis not present

## 2014-10-30 DIAGNOSIS — A419 Sepsis, unspecified organism: Secondary | ICD-10-CM | POA: Diagnosis not present

## 2014-10-30 DIAGNOSIS — R278 Other lack of coordination: Secondary | ICD-10-CM | POA: Diagnosis not present

## 2014-10-30 DIAGNOSIS — Z960 Presence of urogenital implants: Secondary | ICD-10-CM | POA: Diagnosis not present

## 2014-10-30 DIAGNOSIS — R2681 Unsteadiness on feet: Secondary | ICD-10-CM | POA: Diagnosis not present

## 2014-10-30 DIAGNOSIS — M6281 Muscle weakness (generalized): Secondary | ICD-10-CM | POA: Diagnosis not present

## 2014-10-31 DIAGNOSIS — R278 Other lack of coordination: Secondary | ICD-10-CM | POA: Diagnosis not present

## 2014-10-31 DIAGNOSIS — A419 Sepsis, unspecified organism: Secondary | ICD-10-CM | POA: Diagnosis not present

## 2014-10-31 DIAGNOSIS — R2681 Unsteadiness on feet: Secondary | ICD-10-CM | POA: Diagnosis not present

## 2014-10-31 DIAGNOSIS — R489 Unspecified symbolic dysfunctions: Secondary | ICD-10-CM | POA: Diagnosis not present

## 2014-10-31 DIAGNOSIS — M6281 Muscle weakness (generalized): Secondary | ICD-10-CM | POA: Diagnosis not present

## 2014-10-31 DIAGNOSIS — Z960 Presence of urogenital implants: Secondary | ICD-10-CM | POA: Diagnosis not present

## 2014-11-01 DIAGNOSIS — M6281 Muscle weakness (generalized): Secondary | ICD-10-CM | POA: Diagnosis not present

## 2014-11-01 DIAGNOSIS — Z960 Presence of urogenital implants: Secondary | ICD-10-CM | POA: Diagnosis not present

## 2014-11-01 DIAGNOSIS — R2681 Unsteadiness on feet: Secondary | ICD-10-CM | POA: Diagnosis not present

## 2014-11-01 DIAGNOSIS — R489 Unspecified symbolic dysfunctions: Secondary | ICD-10-CM | POA: Diagnosis not present

## 2014-11-01 DIAGNOSIS — R278 Other lack of coordination: Secondary | ICD-10-CM | POA: Diagnosis not present

## 2014-11-01 DIAGNOSIS — A419 Sepsis, unspecified organism: Secondary | ICD-10-CM | POA: Diagnosis not present

## 2014-11-02 DIAGNOSIS — M6281 Muscle weakness (generalized): Secondary | ICD-10-CM | POA: Diagnosis not present

## 2014-11-02 DIAGNOSIS — A419 Sepsis, unspecified organism: Secondary | ICD-10-CM | POA: Diagnosis not present

## 2014-11-02 DIAGNOSIS — R2681 Unsteadiness on feet: Secondary | ICD-10-CM | POA: Diagnosis not present

## 2014-11-02 DIAGNOSIS — R278 Other lack of coordination: Secondary | ICD-10-CM | POA: Diagnosis not present

## 2014-11-02 DIAGNOSIS — R489 Unspecified symbolic dysfunctions: Secondary | ICD-10-CM | POA: Diagnosis not present

## 2014-11-02 DIAGNOSIS — Z960 Presence of urogenital implants: Secondary | ICD-10-CM | POA: Diagnosis not present

## 2014-11-06 DIAGNOSIS — A419 Sepsis, unspecified organism: Secondary | ICD-10-CM | POA: Diagnosis not present

## 2014-11-06 DIAGNOSIS — R489 Unspecified symbolic dysfunctions: Secondary | ICD-10-CM | POA: Diagnosis not present

## 2014-11-06 DIAGNOSIS — M6281 Muscle weakness (generalized): Secondary | ICD-10-CM | POA: Diagnosis not present

## 2014-11-06 DIAGNOSIS — Z960 Presence of urogenital implants: Secondary | ICD-10-CM | POA: Diagnosis not present

## 2014-11-06 DIAGNOSIS — R278 Other lack of coordination: Secondary | ICD-10-CM | POA: Diagnosis not present

## 2014-11-07 DIAGNOSIS — Z960 Presence of urogenital implants: Secondary | ICD-10-CM | POA: Diagnosis not present

## 2014-11-07 DIAGNOSIS — M6281 Muscle weakness (generalized): Secondary | ICD-10-CM | POA: Diagnosis not present

## 2014-11-07 DIAGNOSIS — R489 Unspecified symbolic dysfunctions: Secondary | ICD-10-CM | POA: Diagnosis not present

## 2014-11-07 DIAGNOSIS — A419 Sepsis, unspecified organism: Secondary | ICD-10-CM | POA: Diagnosis not present

## 2014-11-07 DIAGNOSIS — R278 Other lack of coordination: Secondary | ICD-10-CM | POA: Diagnosis not present

## 2014-11-09 DIAGNOSIS — M6281 Muscle weakness (generalized): Secondary | ICD-10-CM | POA: Diagnosis not present

## 2014-11-09 DIAGNOSIS — A419 Sepsis, unspecified organism: Secondary | ICD-10-CM | POA: Diagnosis not present

## 2014-11-09 DIAGNOSIS — R489 Unspecified symbolic dysfunctions: Secondary | ICD-10-CM | POA: Diagnosis not present

## 2014-11-09 DIAGNOSIS — R278 Other lack of coordination: Secondary | ICD-10-CM | POA: Diagnosis not present

## 2014-11-09 DIAGNOSIS — Z960 Presence of urogenital implants: Secondary | ICD-10-CM | POA: Diagnosis not present

## 2014-11-12 DIAGNOSIS — R278 Other lack of coordination: Secondary | ICD-10-CM | POA: Diagnosis not present

## 2014-11-12 DIAGNOSIS — R489 Unspecified symbolic dysfunctions: Secondary | ICD-10-CM | POA: Diagnosis not present

## 2014-11-12 DIAGNOSIS — M6281 Muscle weakness (generalized): Secondary | ICD-10-CM | POA: Diagnosis not present

## 2014-11-12 DIAGNOSIS — A419 Sepsis, unspecified organism: Secondary | ICD-10-CM | POA: Diagnosis not present

## 2014-11-12 DIAGNOSIS — Z960 Presence of urogenital implants: Secondary | ICD-10-CM | POA: Diagnosis not present

## 2014-11-13 DIAGNOSIS — A419 Sepsis, unspecified organism: Secondary | ICD-10-CM | POA: Diagnosis not present

## 2014-11-13 DIAGNOSIS — Z960 Presence of urogenital implants: Secondary | ICD-10-CM | POA: Diagnosis not present

## 2014-11-13 DIAGNOSIS — M6281 Muscle weakness (generalized): Secondary | ICD-10-CM | POA: Diagnosis not present

## 2014-11-13 DIAGNOSIS — R278 Other lack of coordination: Secondary | ICD-10-CM | POA: Diagnosis not present

## 2014-11-13 DIAGNOSIS — R489 Unspecified symbolic dysfunctions: Secondary | ICD-10-CM | POA: Diagnosis not present

## 2014-11-14 DIAGNOSIS — R489 Unspecified symbolic dysfunctions: Secondary | ICD-10-CM | POA: Diagnosis not present

## 2014-11-14 DIAGNOSIS — M6281 Muscle weakness (generalized): Secondary | ICD-10-CM | POA: Diagnosis not present

## 2014-11-14 DIAGNOSIS — A419 Sepsis, unspecified organism: Secondary | ICD-10-CM | POA: Diagnosis not present

## 2014-11-14 DIAGNOSIS — Z960 Presence of urogenital implants: Secondary | ICD-10-CM | POA: Diagnosis not present

## 2014-11-14 DIAGNOSIS — R278 Other lack of coordination: Secondary | ICD-10-CM | POA: Diagnosis not present

## 2014-11-15 DIAGNOSIS — M6281 Muscle weakness (generalized): Secondary | ICD-10-CM | POA: Diagnosis not present

## 2014-11-15 DIAGNOSIS — Z960 Presence of urogenital implants: Secondary | ICD-10-CM | POA: Diagnosis not present

## 2014-11-15 DIAGNOSIS — A419 Sepsis, unspecified organism: Secondary | ICD-10-CM | POA: Diagnosis not present

## 2014-11-15 DIAGNOSIS — R278 Other lack of coordination: Secondary | ICD-10-CM | POA: Diagnosis not present

## 2014-11-15 DIAGNOSIS — R489 Unspecified symbolic dysfunctions: Secondary | ICD-10-CM | POA: Diagnosis not present

## 2014-11-20 DIAGNOSIS — A419 Sepsis, unspecified organism: Secondary | ICD-10-CM | POA: Diagnosis not present

## 2014-11-20 DIAGNOSIS — R489 Unspecified symbolic dysfunctions: Secondary | ICD-10-CM | POA: Diagnosis not present

## 2014-11-20 DIAGNOSIS — M6281 Muscle weakness (generalized): Secondary | ICD-10-CM | POA: Diagnosis not present

## 2014-11-20 DIAGNOSIS — R278 Other lack of coordination: Secondary | ICD-10-CM | POA: Diagnosis not present

## 2014-11-20 DIAGNOSIS — Z960 Presence of urogenital implants: Secondary | ICD-10-CM | POA: Diagnosis not present

## 2014-11-21 DIAGNOSIS — R489 Unspecified symbolic dysfunctions: Secondary | ICD-10-CM | POA: Diagnosis not present

## 2014-11-21 DIAGNOSIS — A419 Sepsis, unspecified organism: Secondary | ICD-10-CM | POA: Diagnosis not present

## 2014-11-21 DIAGNOSIS — M6281 Muscle weakness (generalized): Secondary | ICD-10-CM | POA: Diagnosis not present

## 2014-11-21 DIAGNOSIS — Z960 Presence of urogenital implants: Secondary | ICD-10-CM | POA: Diagnosis not present

## 2014-11-21 DIAGNOSIS — R278 Other lack of coordination: Secondary | ICD-10-CM | POA: Diagnosis not present

## 2014-11-22 DIAGNOSIS — Z960 Presence of urogenital implants: Secondary | ICD-10-CM | POA: Diagnosis not present

## 2014-11-22 DIAGNOSIS — A419 Sepsis, unspecified organism: Secondary | ICD-10-CM | POA: Diagnosis not present

## 2014-11-22 DIAGNOSIS — M6281 Muscle weakness (generalized): Secondary | ICD-10-CM | POA: Diagnosis not present

## 2014-11-22 DIAGNOSIS — R278 Other lack of coordination: Secondary | ICD-10-CM | POA: Diagnosis not present

## 2014-11-22 DIAGNOSIS — R489 Unspecified symbolic dysfunctions: Secondary | ICD-10-CM | POA: Diagnosis not present

## 2014-11-23 DIAGNOSIS — A419 Sepsis, unspecified organism: Secondary | ICD-10-CM | POA: Diagnosis not present

## 2014-11-23 DIAGNOSIS — Z960 Presence of urogenital implants: Secondary | ICD-10-CM | POA: Diagnosis not present

## 2014-11-23 DIAGNOSIS — R489 Unspecified symbolic dysfunctions: Secondary | ICD-10-CM | POA: Diagnosis not present

## 2014-11-23 DIAGNOSIS — R278 Other lack of coordination: Secondary | ICD-10-CM | POA: Diagnosis not present

## 2014-11-23 DIAGNOSIS — M6281 Muscle weakness (generalized): Secondary | ICD-10-CM | POA: Diagnosis not present

## 2014-11-26 DIAGNOSIS — L89311 Pressure ulcer of right buttock, stage 1: Secondary | ICD-10-CM | POA: Diagnosis not present

## 2014-11-26 DIAGNOSIS — M6281 Muscle weakness (generalized): Secondary | ICD-10-CM | POA: Diagnosis not present

## 2014-11-26 DIAGNOSIS — R278 Other lack of coordination: Secondary | ICD-10-CM | POA: Diagnosis not present

## 2014-11-26 DIAGNOSIS — R489 Unspecified symbolic dysfunctions: Secondary | ICD-10-CM | POA: Diagnosis not present

## 2014-11-26 DIAGNOSIS — A419 Sepsis, unspecified organism: Secondary | ICD-10-CM | POA: Diagnosis not present

## 2014-11-26 DIAGNOSIS — L89321 Pressure ulcer of left buttock, stage 1: Secondary | ICD-10-CM | POA: Diagnosis not present

## 2014-11-26 DIAGNOSIS — Z960 Presence of urogenital implants: Secondary | ICD-10-CM | POA: Diagnosis not present

## 2014-11-27 DIAGNOSIS — R489 Unspecified symbolic dysfunctions: Secondary | ICD-10-CM | POA: Diagnosis not present

## 2014-11-27 DIAGNOSIS — A419 Sepsis, unspecified organism: Secondary | ICD-10-CM | POA: Diagnosis not present

## 2014-11-27 DIAGNOSIS — R278 Other lack of coordination: Secondary | ICD-10-CM | POA: Diagnosis not present

## 2014-11-27 DIAGNOSIS — M6281 Muscle weakness (generalized): Secondary | ICD-10-CM | POA: Diagnosis not present

## 2014-11-27 DIAGNOSIS — Z960 Presence of urogenital implants: Secondary | ICD-10-CM | POA: Diagnosis not present

## 2014-11-29 DIAGNOSIS — Z960 Presence of urogenital implants: Secondary | ICD-10-CM | POA: Diagnosis not present

## 2014-11-29 DIAGNOSIS — R489 Unspecified symbolic dysfunctions: Secondary | ICD-10-CM | POA: Diagnosis not present

## 2014-11-29 DIAGNOSIS — A419 Sepsis, unspecified organism: Secondary | ICD-10-CM | POA: Diagnosis not present

## 2014-11-29 DIAGNOSIS — M6281 Muscle weakness (generalized): Secondary | ICD-10-CM | POA: Diagnosis not present

## 2014-11-29 DIAGNOSIS — R278 Other lack of coordination: Secondary | ICD-10-CM | POA: Diagnosis not present

## 2014-11-30 DIAGNOSIS — R489 Unspecified symbolic dysfunctions: Secondary | ICD-10-CM | POA: Diagnosis not present

## 2014-11-30 DIAGNOSIS — R278 Other lack of coordination: Secondary | ICD-10-CM | POA: Diagnosis not present

## 2014-11-30 DIAGNOSIS — Z960 Presence of urogenital implants: Secondary | ICD-10-CM | POA: Diagnosis not present

## 2014-11-30 DIAGNOSIS — A419 Sepsis, unspecified organism: Secondary | ICD-10-CM | POA: Diagnosis not present

## 2014-11-30 DIAGNOSIS — M6281 Muscle weakness (generalized): Secondary | ICD-10-CM | POA: Diagnosis not present

## 2014-12-02 DIAGNOSIS — N200 Calculus of kidney: Secondary | ICD-10-CM | POA: Diagnosis not present

## 2014-12-04 DIAGNOSIS — R489 Unspecified symbolic dysfunctions: Secondary | ICD-10-CM | POA: Diagnosis not present

## 2014-12-04 DIAGNOSIS — R278 Other lack of coordination: Secondary | ICD-10-CM | POA: Diagnosis not present

## 2014-12-04 DIAGNOSIS — M6281 Muscle weakness (generalized): Secondary | ICD-10-CM | POA: Diagnosis not present

## 2014-12-04 DIAGNOSIS — A419 Sepsis, unspecified organism: Secondary | ICD-10-CM | POA: Diagnosis not present

## 2014-12-04 DIAGNOSIS — Z960 Presence of urogenital implants: Secondary | ICD-10-CM | POA: Diagnosis not present

## 2014-12-05 DIAGNOSIS — M6281 Muscle weakness (generalized): Secondary | ICD-10-CM | POA: Diagnosis not present

## 2014-12-05 DIAGNOSIS — R278 Other lack of coordination: Secondary | ICD-10-CM | POA: Diagnosis not present

## 2014-12-05 DIAGNOSIS — Z960 Presence of urogenital implants: Secondary | ICD-10-CM | POA: Diagnosis not present

## 2014-12-05 DIAGNOSIS — R489 Unspecified symbolic dysfunctions: Secondary | ICD-10-CM | POA: Diagnosis not present

## 2014-12-05 DIAGNOSIS — A419 Sepsis, unspecified organism: Secondary | ICD-10-CM | POA: Diagnosis not present

## 2014-12-10 DIAGNOSIS — M6281 Muscle weakness (generalized): Secondary | ICD-10-CM | POA: Diagnosis not present

## 2014-12-10 DIAGNOSIS — R2681 Unsteadiness on feet: Secondary | ICD-10-CM | POA: Diagnosis not present

## 2014-12-10 DIAGNOSIS — Z960 Presence of urogenital implants: Secondary | ICD-10-CM | POA: Diagnosis not present

## 2014-12-11 DIAGNOSIS — N135 Crossing vessel and stricture of ureter without hydronephrosis: Secondary | ICD-10-CM | POA: Diagnosis not present

## 2014-12-11 DIAGNOSIS — N281 Cyst of kidney, acquired: Secondary | ICD-10-CM | POA: Diagnosis not present

## 2014-12-13 DIAGNOSIS — R2681 Unsteadiness on feet: Secondary | ICD-10-CM | POA: Diagnosis not present

## 2014-12-13 DIAGNOSIS — Z960 Presence of urogenital implants: Secondary | ICD-10-CM | POA: Diagnosis not present

## 2014-12-13 DIAGNOSIS — M6281 Muscle weakness (generalized): Secondary | ICD-10-CM | POA: Diagnosis not present

## 2014-12-20 DIAGNOSIS — M6281 Muscle weakness (generalized): Secondary | ICD-10-CM | POA: Diagnosis not present

## 2014-12-20 DIAGNOSIS — R2681 Unsteadiness on feet: Secondary | ICD-10-CM | POA: Diagnosis not present

## 2014-12-20 DIAGNOSIS — Z960 Presence of urogenital implants: Secondary | ICD-10-CM | POA: Diagnosis not present

## 2014-12-25 DIAGNOSIS — N200 Calculus of kidney: Secondary | ICD-10-CM | POA: Diagnosis not present

## 2014-12-25 DIAGNOSIS — R399 Unspecified symptoms and signs involving the genitourinary system: Secondary | ICD-10-CM | POA: Diagnosis not present

## 2014-12-25 DIAGNOSIS — Z87440 Personal history of urinary (tract) infections: Secondary | ICD-10-CM | POA: Diagnosis not present

## 2014-12-25 DIAGNOSIS — Z1635 Resistance to multiple antimicrobial drugs: Secondary | ICD-10-CM | POA: Diagnosis not present

## 2015-01-22 DIAGNOSIS — R399 Unspecified symptoms and signs involving the genitourinary system: Secondary | ICD-10-CM | POA: Diagnosis not present

## 2015-01-22 DIAGNOSIS — N200 Calculus of kidney: Secondary | ICD-10-CM | POA: Diagnosis not present

## 2015-01-22 DIAGNOSIS — Z87440 Personal history of urinary (tract) infections: Secondary | ICD-10-CM | POA: Diagnosis not present

## 2015-01-22 DIAGNOSIS — Z1635 Resistance to multiple antimicrobial drugs: Secondary | ICD-10-CM | POA: Diagnosis not present

## 2015-03-29 DIAGNOSIS — E8770 Fluid overload, unspecified: Secondary | ICD-10-CM | POA: Diagnosis present

## 2015-03-29 DIAGNOSIS — N132 Hydronephrosis with renal and ureteral calculous obstruction: Secondary | ICD-10-CM | POA: Diagnosis not present

## 2015-03-29 DIAGNOSIS — Z833 Family history of diabetes mellitus: Secondary | ICD-10-CM | POA: Diagnosis not present

## 2015-03-29 DIAGNOSIS — R112 Nausea with vomiting, unspecified: Secondary | ICD-10-CM | POA: Diagnosis not present

## 2015-03-29 DIAGNOSIS — N390 Urinary tract infection, site not specified: Secondary | ICD-10-CM | POA: Diagnosis not present

## 2015-03-29 DIAGNOSIS — N202 Calculus of kidney with calculus of ureter: Secondary | ICD-10-CM | POA: Diagnosis not present

## 2015-03-29 DIAGNOSIS — E1165 Type 2 diabetes mellitus with hyperglycemia: Secondary | ICD-10-CM | POA: Diagnosis not present

## 2015-03-29 DIAGNOSIS — N201 Calculus of ureter: Secondary | ICD-10-CM | POA: Diagnosis not present

## 2015-03-29 DIAGNOSIS — N200 Calculus of kidney: Secondary | ICD-10-CM | POA: Diagnosis not present

## 2015-03-29 DIAGNOSIS — Z955 Presence of coronary angioplasty implant and graft: Secondary | ICD-10-CM | POA: Diagnosis not present

## 2015-03-29 DIAGNOSIS — Z6841 Body Mass Index (BMI) 40.0 and over, adult: Secondary | ICD-10-CM | POA: Diagnosis not present

## 2015-03-29 DIAGNOSIS — E039 Hypothyroidism, unspecified: Secondary | ICD-10-CM | POA: Diagnosis present

## 2015-03-29 DIAGNOSIS — I878 Other specified disorders of veins: Secondary | ICD-10-CM | POA: Diagnosis present

## 2015-03-29 DIAGNOSIS — G92 Toxic encephalopathy: Secondary | ICD-10-CM | POA: Diagnosis not present

## 2015-03-29 DIAGNOSIS — R109 Unspecified abdominal pain: Secondary | ICD-10-CM | POA: Diagnosis not present

## 2015-03-29 DIAGNOSIS — E875 Hyperkalemia: Secondary | ICD-10-CM | POA: Diagnosis present

## 2015-03-29 DIAGNOSIS — D649 Anemia, unspecified: Secondary | ICD-10-CM | POA: Diagnosis present

## 2015-03-29 DIAGNOSIS — G629 Polyneuropathy, unspecified: Secondary | ICD-10-CM | POA: Diagnosis present

## 2015-03-29 DIAGNOSIS — N209 Urinary calculus, unspecified: Secondary | ICD-10-CM | POA: Diagnosis not present

## 2015-03-29 DIAGNOSIS — Z87891 Personal history of nicotine dependence: Secondary | ICD-10-CM | POA: Diagnosis not present

## 2015-03-29 DIAGNOSIS — R1084 Generalized abdominal pain: Secondary | ICD-10-CM | POA: Diagnosis not present

## 2015-03-29 DIAGNOSIS — Z8249 Family history of ischemic heart disease and other diseases of the circulatory system: Secondary | ICD-10-CM | POA: Diagnosis not present

## 2015-03-29 DIAGNOSIS — I129 Hypertensive chronic kidney disease with stage 1 through stage 4 chronic kidney disease, or unspecified chronic kidney disease: Secondary | ICD-10-CM | POA: Diagnosis present

## 2015-03-29 DIAGNOSIS — N179 Acute kidney failure, unspecified: Secondary | ICD-10-CM | POA: Diagnosis not present

## 2015-03-30 DIAGNOSIS — N200 Calculus of kidney: Secondary | ICD-10-CM | POA: Diagnosis not present

## 2015-04-03 ENCOUNTER — Encounter: Payer: Self-pay | Admitting: Adult Health

## 2015-04-03 ENCOUNTER — Ambulatory Visit (INDEPENDENT_AMBULATORY_CARE_PROVIDER_SITE_OTHER): Payer: Medicare Other | Admitting: Adult Health

## 2015-04-03 VITALS — BP 148/68 | Temp 98.0°F | Ht 62.0 in | Wt 248.8 lb

## 2015-04-03 DIAGNOSIS — Z09 Encounter for follow-up examination after completed treatment for conditions other than malignant neoplasm: Secondary | ICD-10-CM | POA: Diagnosis not present

## 2015-04-03 DIAGNOSIS — N201 Calculus of ureter: Secondary | ICD-10-CM | POA: Diagnosis not present

## 2015-04-03 DIAGNOSIS — N179 Acute kidney failure, unspecified: Secondary | ICD-10-CM | POA: Diagnosis not present

## 2015-04-03 LAB — CBC WITH DIFFERENTIAL/PLATELET
Basophils Absolute: 0 10*3/uL (ref 0.0–0.1)
Basophils Relative: 0.3 % (ref 0.0–3.0)
Eosinophils Absolute: 0.1 10*3/uL (ref 0.0–0.7)
Eosinophils Relative: 2.4 % (ref 0.0–5.0)
HCT: 29.1 % — ABNORMAL LOW (ref 36.0–46.0)
Hemoglobin: 9.7 g/dL — ABNORMAL LOW (ref 12.0–15.0)
Lymphocytes Relative: 18.9 % (ref 12.0–46.0)
Lymphs Abs: 1.1 10*3/uL (ref 0.7–4.0)
MCHC: 33.4 g/dL (ref 30.0–36.0)
MCV: 90.6 fl (ref 78.0–100.0)
Monocytes Absolute: 0.6 10*3/uL (ref 0.1–1.0)
Monocytes Relative: 10.6 % (ref 3.0–12.0)
Neutro Abs: 4 10*3/uL (ref 1.4–7.7)
Neutrophils Relative %: 67.8 % (ref 43.0–77.0)
Platelets: 186 10*3/uL (ref 150.0–400.0)
RBC: 3.21 Mil/uL — ABNORMAL LOW (ref 3.87–5.11)
RDW: 17.9 % — ABNORMAL HIGH (ref 11.5–15.5)
WBC: 5.8 10*3/uL (ref 4.0–10.5)

## 2015-04-03 LAB — COMPREHENSIVE METABOLIC PANEL
ALT: 11 U/L (ref 0–35)
AST: 9 U/L (ref 0–37)
Albumin: 3.4 g/dL — ABNORMAL LOW (ref 3.5–5.2)
Alkaline Phosphatase: 76 U/L (ref 39–117)
BUN: 38 mg/dL — ABNORMAL HIGH (ref 6–23)
CO2: 29 mEq/L (ref 19–32)
Calcium: 9.7 mg/dL (ref 8.4–10.5)
Chloride: 104 mEq/L (ref 96–112)
Creatinine, Ser: 1.55 mg/dL — ABNORMAL HIGH (ref 0.40–1.20)
GFR: 34.4 mL/min — ABNORMAL LOW (ref >60.00)
Glucose, Bld: 209 mg/dL — ABNORMAL HIGH (ref 70–99)
Potassium: 4.5 mEq/L (ref 3.5–5.1)
Sodium: 139 mEq/L (ref 135–145)
Total Bilirubin: 0.5 mg/dL (ref 0.2–1.2)
Total Protein: 7.2 g/dL (ref 6.0–8.3)

## 2015-04-03 NOTE — Patient Instructions (Signed)
It was great meeting you today!   I will follow up with you regarding your labs.   Please finish the antibiotics   Make sure you are drinking a lot of water!  Follow up with me in a month to establish care and please feel free to contact me if you need anything.

## 2015-04-03 NOTE — Progress Notes (Addendum)
Subjective:    Patient ID: [name], female    DOB: Jun 13, 1935, 80 y.o.   MRN: [id number]  HPI  80 year old female who is new to me, presents to the office today for hospital follow-up. She was seen at [facility] [place], [place]) on 03/29/2015 for extreme left flank pain with associated nausea and vomiting.   She was admitted with left proximal ureteral calculus (31mm), with extravasation of urine from the left kidney UTI, and acute renal failure. CT also showed multiple other stones in left kidney between 3 and 5 mm Her hospital stay was three days. She had cystoscope and  stent placed in left ureter on 03/30/2015 and was sent home with prescription for Cirpo 250mg  BID for 7 days. Per her family labs at outside hospital showed a initial creatinine of 3 after fluid hydration her creatinine was 1.8 upon discharge from hospital. She does have a prior history of kidney stones.   Continues to fatigued and "worn down", but endorses feeling better since leaving the hospital.  Denies any fevers,nausea, vomiting or UTI type symptoms.  Her appetite is improving but she is not drinking a lot of fluids.  She is going to [facility] for evaluation on Monday August 1     Review of Systems  Constitutional: Positive for activity change and fatigue. Negative for fever, diaphoresis and appetite change.  Respiratory: Positive for shortness of breath. Negative for apnea, cough, choking, chest tightness, wheezing and stridor.   Cardiovascular: Positive for leg swelling. Negative for chest pain and palpitations.  Genitourinary: Positive for urgency, frequency and difficulty urinating.  Musculoskeletal: Positive for myalgias, arthralgias and gait problem.  Skin: Negative.   Neurological: Negative.   All other systems reviewed and are negative.  Past Medical History  Diagnosis Date  . Unspecified hypothyroidism   . Pure hypercholesterolemia   . Unspecified essential hypertension   .  Syncope and collapse   . Type II or unspecified type diabetes mellitus without mention of complication, not stated as uncontrolled   . Depressive disorder, not elsewhere classified   . Extrinsic asthma, unspecified     no problem since adulthood  . Obesity   . Anxiety state, unspecified     panic attacks  . OSA on CPAP     severe  . Allergic rhinitis   . Respiratory failure with hypoxia 09/2008    acute, secondary to multiple bilateral pulmonary embolism , negative hypercoagulable workup 09/2008 hospital stay    History   Social History  . Marital Status: Widowed    Spouse Name: N/A  . Number of Children: 2  . Years of Education: N/A   Occupational History  . 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 Smoking status: Former Smoker -- 20 years    Quit date: 09/07/1989  . Smokeless tobacco: Not on file  . Alcohol Use: No  . Drug Use: No  . Sexual Activity: Not on file   Other Topics Concern  . Not on file   Social History Narrative    Past Surgical History  Procedure Laterality Date  . Thyroid surgery    . Knee replaced      Family History  Problem Relation Age of Onset  . Arthritis Other   . Diabetes Other   . Hyperlipidemia Other   . Hypertension Other   . Coronary artery disease Other   . Stroke Other   .  Allergies Mother   . Clotting disorder Mother   . Osteoarthritis Mother   . Asthma Mother   . Osteoarthritis Daughter   . Rheum arthritis Maternal Grandmother   . Clotting disorder Maternal Grandmother   . Clotting disorder Maternal Uncle   . Clotting disorder Daughter   . Allergies Daughter     Allergies  Allergen Reactions  . Aspirin   . Contrast Media [Iodinated Diagnostic Agents]   . Flagyl [Metronidazole]   . Fluticasone-Salmeterol   . Latex   . Lisinopril     REACTION: cough  . Metformin     REACTION: nausea  . Sulfonamide Derivatives   . Tetracycline     Current Outpatient Prescriptions on  File Prior to Visit  Medication Sig Dispense Refill  . Coenzyme Q10 200 MG capsule Take 200 mg by mouth daily.      . fexofenadine (ALLEGRA) 180 MG tablet Take 180 mg by mouth daily.      Marland Kitchen HUMULIN 70/30 (70-30) 100 UNIT/ML injection 65 units in the morning and 30 units in the evening    . Insulin Pen Needle (B-D UF III MINI PEN NEEDLES) 31G X 5 MM MISC Use daily for insulin injection 100 each 2  . losartan (COZAAR) 50 MG tablet Take 1 tablet (50 mg total) by mouth daily. Office visit is needed for future refills 30 tablet 3  . Multiple Vitamin (MULTIVITAMIN) tablet Take 1 tablet by mouth daily.      . ranitidine (ZANTAC) 150 MG capsule Take 150 mg by mouth 2 (two) times daily as needed.      . Cholecalciferol (VITAMIN D) 2000 UNITS CAPS Take 1 capsule by mouth daily.      . traMADol (ULTRAM) 50 MG tablet PRN     No current facility-administered medications on file prior to visit.    BP 148/68 mmHg  Temp(Src) 98 F (36.7 C) (Oral)  Ht 5\' 2"  (1.575 m)  Wt 248 lb 12.8 oz (112.855 kg)  BMI 45.49 kg/m2       Objective:   Physical Exam  Constitutional: She is oriented to person, place, and time. She appears well-developed and well-nourished. No distress.  Eyes: Right eye exhibits no discharge. Left eye exhibits no discharge.  Cardiovascular: Normal rate, regular rhythm, normal heart sounds and intact distal pulses.  Exam reveals no gallop and no friction rub.   No murmur heard. Pulmonary/Chest: Breath sounds normal. No respiratory distress. She has no wheezes. She has no rales. She exhibits no tenderness.  Musculoskeletal: She exhibits edema (+2 pitting edema to bilateral lower extremities). She exhibits no tenderness.  Has slow steady gait with walker.   Neurological: She is alert and oriented to person, place, and time.  Skin: Skin is warm and dry. No rash noted. She is not diaphoretic. No erythema. No pallor.  Psychiatric: She has a normal mood and affect. Her behavior is normal.  Judgment and thought content normal.  Nursing note and vitals reviewed.      Assessment & Plan:  1. Hospital discharge follow-up - CBC with Differential/Platelet - CMP - Finish course of abx for UTI - Follow up with labs - She is to follow up in one month for establish care visit - Stay well hydrated. - Continue to increase activity level 2. Acute renal failure, unspecified acute renal failure type - CBC with Differential/Platelet - CMP - Force fluids to stay well hydrated - Will follow up on labs.

## 2015-04-03 NOTE — Progress Notes (Signed)
Pre visit review using our clinic review tool, if applicable. No additional management support is needed unless otherwise documented below in the visit note. 

## 2015-04-08 DIAGNOSIS — N201 Calculus of ureter: Secondary | ICD-10-CM | POA: Diagnosis not present

## 2015-04-08 DIAGNOSIS — E119 Type 2 diabetes mellitus without complications: Secondary | ICD-10-CM | POA: Diagnosis not present

## 2015-04-08 DIAGNOSIS — N200 Calculus of kidney: Secondary | ICD-10-CM | POA: Diagnosis not present

## 2015-04-08 DIAGNOSIS — E039 Hypothyroidism, unspecified: Secondary | ICD-10-CM | POA: Diagnosis not present

## 2015-04-15 DIAGNOSIS — I872 Venous insufficiency (chronic) (peripheral): Secondary | ICD-10-CM | POA: Diagnosis not present

## 2015-04-15 DIAGNOSIS — I10 Essential (primary) hypertension: Secondary | ICD-10-CM | POA: Diagnosis not present

## 2015-04-15 DIAGNOSIS — G4733 Obstructive sleep apnea (adult) (pediatric): Secondary | ICD-10-CM | POA: Diagnosis not present

## 2015-04-15 DIAGNOSIS — E119 Type 2 diabetes mellitus without complications: Secondary | ICD-10-CM | POA: Diagnosis not present

## 2015-04-15 DIAGNOSIS — Z794 Long term (current) use of insulin: Secondary | ICD-10-CM | POA: Diagnosis not present

## 2015-04-15 DIAGNOSIS — N201 Calculus of ureter: Secondary | ICD-10-CM | POA: Diagnosis not present

## 2015-04-15 DIAGNOSIS — N200 Calculus of kidney: Secondary | ICD-10-CM | POA: Diagnosis not present

## 2015-04-15 DIAGNOSIS — E890 Postprocedural hypothyroidism: Secondary | ICD-10-CM | POA: Diagnosis not present

## 2015-04-19 ENCOUNTER — Telehealth: Payer: Self-pay | Admitting: Family Medicine

## 2015-04-19 MED ORDER — INSULIN NPH ISOPHANE & REGULAR (70-30) 100 UNIT/ML ~~LOC~~ SUSP: SUBCUTANEOUS | Status: DC

## 2015-04-19 NOTE — Telephone Encounter (Signed)
Changed pt's appt form 4 week follow up to est care visit.  Called and spoke with pt and pt confirmed that she does take Humulin 65 units every morning and 30 units every evening. Rx sent to pharmacy.

## 2015-04-19 NOTE — Telephone Encounter (Signed)
Refill request for Humulin 70/30 and send to [facility].  ( inject 65 units every morning & 30 units every evening )

## 2015-04-22 ENCOUNTER — Other Ambulatory Visit: Payer: Self-pay | Admitting: Adult Health

## 2015-04-23 NOTE — Telephone Encounter (Signed)
Called and spoke with pt and pt states she has continously been taking the Losartan 25 mg qd.  Pt is requesting a 90 day supply.  Rx sent to the pharmacy.

## 2015-04-26 ENCOUNTER — Telehealth: Payer: Self-pay

## 2015-04-26 NOTE — Telephone Encounter (Signed)
Free from [facility] called to state he went out to see pt and pt stated she will have surgery on her kidney on Tuesday.  Per Free if he evaluates pt now he will have to re-evaluate her after surgery.  Free would like to wait until pt has had surgery and returned home before starting PT.  Ok per [place] to wait to start PT after surgery.  Free is aware.

## 2015-04-29 DIAGNOSIS — L299 Pruritus, unspecified: Secondary | ICD-10-CM | POA: Diagnosis present

## 2015-04-29 DIAGNOSIS — E1165 Type 2 diabetes mellitus with hyperglycemia: Secondary | ICD-10-CM | POA: Diagnosis present

## 2015-04-29 DIAGNOSIS — N200 Calculus of kidney: Secondary | ICD-10-CM | POA: Diagnosis not present

## 2015-04-29 DIAGNOSIS — Z794 Long term (current) use of insulin: Secondary | ICD-10-CM | POA: Diagnosis not present

## 2015-04-29 DIAGNOSIS — Z87891 Personal history of nicotine dependence: Secondary | ICD-10-CM | POA: Diagnosis not present

## 2015-04-29 DIAGNOSIS — G4733 Obstructive sleep apnea (adult) (pediatric): Secondary | ICD-10-CM | POA: Diagnosis present

## 2015-04-29 DIAGNOSIS — E890 Postprocedural hypothyroidism: Secondary | ICD-10-CM | POA: Diagnosis not present

## 2015-04-29 DIAGNOSIS — N390 Urinary tract infection, site not specified: Secondary | ICD-10-CM | POA: Diagnosis present

## 2015-04-29 DIAGNOSIS — I872 Venous insufficiency (chronic) (peripheral): Secondary | ICD-10-CM | POA: Diagnosis not present

## 2015-04-29 DIAGNOSIS — K219 Gastro-esophageal reflux disease without esophagitis: Secondary | ICD-10-CM | POA: Diagnosis not present

## 2015-04-29 DIAGNOSIS — N189 Chronic kidney disease, unspecified: Secondary | ICD-10-CM | POA: Diagnosis present

## 2015-04-29 DIAGNOSIS — N201 Calculus of ureter: Secondary | ICD-10-CM | POA: Diagnosis not present

## 2015-04-29 DIAGNOSIS — E1122 Type 2 diabetes mellitus with diabetic chronic kidney disease: Secondary | ICD-10-CM | POA: Diagnosis present

## 2015-04-29 DIAGNOSIS — N202 Calculus of kidney with calculus of ureter: Secondary | ICD-10-CM | POA: Diagnosis not present

## 2015-04-29 DIAGNOSIS — I129 Hypertensive chronic kidney disease with stage 1 through stage 4 chronic kidney disease, or unspecified chronic kidney disease: Secondary | ICD-10-CM | POA: Diagnosis present

## 2015-04-30 DIAGNOSIS — G4733 Obstructive sleep apnea (adult) (pediatric): Secondary | ICD-10-CM | POA: Diagnosis not present

## 2015-04-30 DIAGNOSIS — N390 Urinary tract infection, site not specified: Secondary | ICD-10-CM | POA: Diagnosis not present

## 2015-04-30 DIAGNOSIS — I129 Hypertensive chronic kidney disease with stage 1 through stage 4 chronic kidney disease, or unspecified chronic kidney disease: Secondary | ICD-10-CM | POA: Diagnosis not present

## 2015-04-30 DIAGNOSIS — N202 Calculus of kidney with calculus of ureter: Secondary | ICD-10-CM | POA: Diagnosis not present

## 2015-04-30 DIAGNOSIS — N200 Calculus of kidney: Secondary | ICD-10-CM | POA: Diagnosis not present

## 2015-04-30 DIAGNOSIS — E1165 Type 2 diabetes mellitus with hyperglycemia: Secondary | ICD-10-CM | POA: Diagnosis not present

## 2015-04-30 DIAGNOSIS — N201 Calculus of ureter: Secondary | ICD-10-CM | POA: Diagnosis not present

## 2015-04-30 DIAGNOSIS — E1122 Type 2 diabetes mellitus with diabetic chronic kidney disease: Secondary | ICD-10-CM | POA: Diagnosis not present

## 2015-05-02 DIAGNOSIS — Z87442 Personal history of urinary calculi: Secondary | ICD-10-CM | POA: Diagnosis not present

## 2015-05-02 DIAGNOSIS — Z48816 Encounter for surgical aftercare following surgery on the genitourinary system: Secondary | ICD-10-CM | POA: Diagnosis not present

## 2015-05-03 ENCOUNTER — Ambulatory Visit: Payer: Medicare Other | Admitting: Adult Health

## 2015-05-08 ENCOUNTER — Ambulatory Visit: Payer: Medicare Other | Admitting: Adult Health

## 2015-05-08 ENCOUNTER — Encounter: Payer: Self-pay | Admitting: Adult Health

## 2015-05-08 ENCOUNTER — Ambulatory Visit (INDEPENDENT_AMBULATORY_CARE_PROVIDER_SITE_OTHER): Payer: Medicare Other | Admitting: Adult Health

## 2015-05-08 ENCOUNTER — Telehealth: Payer: Self-pay

## 2015-05-08 VITALS — BP 142/50 | HR 54 | Temp 98.7°F | Resp 18 | Ht 62.0 in | Wt 247.8 lb

## 2015-05-08 DIAGNOSIS — Z7189 Other specified counseling: Secondary | ICD-10-CM

## 2015-05-08 DIAGNOSIS — E1165 Type 2 diabetes mellitus with hyperglycemia: Secondary | ICD-10-CM

## 2015-05-08 DIAGNOSIS — IMO0002 Reserved for concepts with insufficient information to code with codable children: Secondary | ICD-10-CM

## 2015-05-08 DIAGNOSIS — I10 Essential (primary) hypertension: Secondary | ICD-10-CM

## 2015-05-08 DIAGNOSIS — Z23 Encounter for immunization: Secondary | ICD-10-CM | POA: Diagnosis not present

## 2015-05-08 DIAGNOSIS — Z7689 Persons encountering health services in other specified circumstances: Secondary | ICD-10-CM

## 2015-05-08 MED ORDER — TIZANIDINE HCL 4 MG PO TABS: 4.0000 mg | ORAL_TABLET | Freq: Four times a day (QID) | ORAL | Status: DC | PRN

## 2015-05-08 NOTE — Progress Notes (Signed)
HPI:  [name] is here to establish care. She is a pleasant 80 year old female with significant PMH. She has a past medical history of Unspecified hypothyroidism; Pure hypercholesterolemia; Unspecified essential hypertension; Syncope and collapse; Type II or unspecified type diabetes mellitus without mention of complication, not stated as uncontrolled; Depressive disorder, not elsewhere classified; Extrinsic asthma, unspecified; Obesity; Anxiety state, unspecified; OSA on CPAP; Allergic rhinitis; and Respiratory failure with hypoxia (09/2008). she is with her daughter during this visit.   Last PCP and physical: January 2013 Immunizations:UTD Diet:Tries to eat healthy.  Exercise: Does not exercise Colonoscopy: 2009 Dexa: Has had one, does not remember when Mammogram:2011 Eye: Diabetic Eye exam 2012 - has an appointment for exam.    Has the following chronic problems that require follow up and concerns today:   HTN She takes losartan 25mg  and feels like this is working to control her blood pressure.    Diabetes - She uses NPH 70/30, 65 units in the morning and 30 units at night. Her fasting blood sugars have been 155. She endorses that she feels hypoglycemic when she gets below 120.   Kidney Stones - She has had multiple hospitalizations due to her kidney stones. She was seen at [facility] on 04/30/2015 for Lithotripsy of left kidney. She endorses that she pulled her stent last night and is doing well. She continues to feel tired from this procedure.   ROS negative for unless reported above: fevers, chills,feeling poorly, unintentional weight loss, hearing or vision loss, chest pain, palpitations, leg claudication, struggling to breath,Not feeling congested in the chest, no orthopenia, no cough,no wheezing, normal appetite, no soft tissue swelling, no hemoptysis, melena, hematochezia, hematuria, falls, loc, si, or thoughts of self harm.    Past Medical History  Diagnosis Date  .  Unspecified hypothyroidism   . Pure hypercholesterolemia   . Unspecified essential hypertension   . Syncope and collapse   . Type II or unspecified type diabetes mellitus without mention of complication, not stated as uncontrolled   . Depressive disorder, not elsewhere classified   . Extrinsic asthma, unspecified     no problem since adulthood  . Obesity   . Anxiety state, unspecified     panic attacks  . OSA on CPAP     severe  . Allergic rhinitis   . Respiratory failure with hypoxia 09/2008    acute, secondary to multiple bilateral pulmonary embolism , negative hypercoagulable workup 09/2008 hospital stay    Past Surgical History  Procedure Laterality Date  . Thyroid surgery    . Knee replaced      Family History  Problem Relation Age of Onset  . Arthritis Other   . Diabetes Other   . Hyperlipidemia Other   . Hypertension Other   . Coronary artery disease Other   . Stroke Other   . Allergies Mother   . Clotting disorder Mother   . Osteoarthritis Mother   . Asthma Mother   . Osteoarthritis Daughter   . Rheum arthritis Maternal Grandmother   . Clotting disorder Maternal Grandmother   . Clotting disorder Maternal Uncle   . Clotting disorder Daughter   . Allergies Daughter     Social History   Social History  . Marital Status: Widowed    Spouse Name: N/A  . Number of Children: 2  . Years of Education: N/A   Occupational History  . OWNER     Self employed- runs Advertising copywriter   Social History Main  Topics  . Smoking status: Former Smoker -- 20 years    Quit date: 09/07/1989  . Smokeless tobacco: Not on file  . Alcohol Use: No  . Drug Use: No  . Sexual Activity: Not on file   Other Topics Concern  . Not on file   Social History Narrative     Current outpatient prescriptions:  .  Cholecalciferol (VITAMIN D) 2000 UNITS CAPS, Take 1 capsule by mouth daily.  , Disp: , Rfl:  .  Coenzyme Q10 200 MG capsule, Take 200 mg by mouth daily.  , Disp: , Rfl:   .  fexofenadine (ALLEGRA) 180 MG tablet, Take 180 mg by mouth daily.  , Disp: , Rfl:  .  insulin NPH-regular Human (HUMULIN 70/30) (70-30) 100 UNIT/ML injection, 65 units in the morning and 30 units in the evening, Disp: 10 mL, Rfl: 5 .  Insulin Pen Needle (B-D UF III MINI PEN NEEDLES) 31G X 5 MM MISC, Use daily for insulin injection, Disp: 100 each, Rfl: 2 .  levothyroxine (SYNTHROID, LEVOTHROID) 112 MCG tablet, TK 1 T PO D, Disp: , Rfl: 2 .  losartan (COZAAR) 25 MG tablet, TAKE 1 TABLET BY MOUTH EVERY DAY, Disp: 90 tablet, Rfl: 1 .  Multiple Vitamin (MULTIVITAMIN) tablet, Take 1 tablet by mouth daily.  , Disp: , Rfl:  .  oxybutynin (DITROPAN) 5 MG tablet, Take 5 mg by mouth 3 (three) times daily., Disp: , Rfl:  .  oxyCODONE (OXY IR/ROXICODONE) 5 MG immediate release tablet, Take 5 mg by mouth every 4 (four) hours as needed for severe pain., Disp: , Rfl:  .  potassium citrate (UROCIT-K) 10 MEQ (1080 MG) SR tablet, TK 1 T PO TID WITH MEALS, Disp: , Rfl: 3 .  ranitidine (ZANTAC) 150 MG capsule, Take 150 mg by mouth 2 (two) times daily as needed.  , Disp: , Rfl:  .  sertraline (ZOLOFT) 50 MG tablet, Take 50 mg by mouth daily., Disp: , Rfl:  .  tamsulosin (FLOMAX) 0.4 MG CAPS capsule, Take 0.4 mg by mouth daily., Disp: , Rfl: 3 .  traMADol (ULTRAM) 50 MG tablet, PRN, Disp: , Rfl:   EXAM:  There were no vitals filed for this visit.  There is no weight on file to calculate BMI.  GENERAL: vitals reviewed and listed above, alert, oriented, appears well hydrated and in no acute distress. She looks tired and wornout but is in good spirits.  HEENT: atraumatic, conjunttiva clear, no obvious abnormalities on inspection of external nose and ears.   NECK: Neck is soft and supple without masses, no adenopathy or thyromegaly, trachea midline, no JVD. Normal range of motion.   LUNGS: clear to auscultation bilaterally, no wheezes, rales or rhonchi, good air movement  CV: Regular rate and rhythm, normal  S1/S2, no audible murmurs, gallops, or rubs. No carotid bruit and trace peripheral edema  MS: moves all extremities without noticeable abnormality. No edema noted. She walks with a walker  Abd: soft/nontender/nondistended/normal bowel sounds   Skin: warm and dry, no rash   Extremities: No clubbing, cyanosis, or edema. Capillary refill is WNL. Pulses intact bilaterally in upper and lower extremities.   Neuro: CN II-XII intact, sensation and reflexes normal throughout, 5/5 muscle strength in bilateral upper and lower extremities. Normal finger to nose. Normal rapid alternating movements.   PSYCH: pleasant and cooperative, no obvious depression or anxiety  ASSESSMENT AND PLAN:  1. Encounter to establish care - Follow up in one month for CPE - Follow  up sooner if needed - Start exercising and eating healthy   2. Essential hypertension - Appears controlled. Monitor at home and bring log  3. Diabetes mellitus type 2, uncontrolled - Will check A1c next visit. Keep log of blood sugars and bring to next visit.   4. Need for prophylactic vaccination and inoculation against influenza - Flu vaccine HIGH DOSE PF (Fluzone High dose)    -We reviewed the PMH, PSH, FH, SH, Meds and Allergies. -We provided refills for any medications we will prescribe as needed. -We addressed current concerns per orders and patient instructions. -We have asked for records for pertinent exams, studies, vaccines and notes from previous providers. -We have advised patient to follow up per instructions below.   -Patient advised to return or notify a provider immediately if symptoms worsen or persist or new concerns arise.  There are no Patient Instructions on file for this visit.   [employer]

## 2015-05-08 NOTE — Patient Instructions (Signed)
It was great seeing you again!  I have sent in a prescription to the pharmacy for Zanaflex, this is a muslce relaxer. Take one tonight to see how it makes you feel.   Continue to ice and use heat.   Follow up in one month for complete physical exam.

## 2015-05-08 NOTE — Telephone Encounter (Signed)
Patient had an appointment 05/08/15 at 2:00pm with [facility] to establish..  When she did not show, I called her on both phone numbers and left a detailed message on her cell.

## 2015-05-10 DIAGNOSIS — Z87442 Personal history of urinary calculi: Secondary | ICD-10-CM | POA: Diagnosis not present

## 2015-05-10 DIAGNOSIS — Z48816 Encounter for surgical aftercare following surgery on the genitourinary system: Secondary | ICD-10-CM | POA: Diagnosis not present

## 2015-05-13 DIAGNOSIS — Z87442 Personal history of urinary calculi: Secondary | ICD-10-CM | POA: Diagnosis not present

## 2015-05-13 DIAGNOSIS — Z48816 Encounter for surgical aftercare following surgery on the genitourinary system: Secondary | ICD-10-CM | POA: Diagnosis not present

## 2015-05-28 DIAGNOSIS — Z48816 Encounter for surgical aftercare following surgery on the genitourinary system: Secondary | ICD-10-CM | POA: Diagnosis not present

## 2015-05-28 DIAGNOSIS — Z87442 Personal history of urinary calculi: Secondary | ICD-10-CM | POA: Diagnosis not present

## 2015-05-31 DIAGNOSIS — Z87442 Personal history of urinary calculi: Secondary | ICD-10-CM | POA: Diagnosis not present

## 2015-05-31 DIAGNOSIS — Z48816 Encounter for surgical aftercare following surgery on the genitourinary system: Secondary | ICD-10-CM | POA: Diagnosis not present

## 2015-06-06 ENCOUNTER — Telehealth: Payer: Self-pay | Admitting: Adult Health

## 2015-06-06 NOTE — Telephone Encounter (Signed)
Ok with me 

## 2015-06-06 NOTE — Telephone Encounter (Signed)
Would like to extend physical therapy for once a week for the next 2 wks For gait training

## 2015-06-07 NOTE — Telephone Encounter (Signed)
Called and spoke with Teddi and he is aware.

## 2015-06-11 ENCOUNTER — Encounter: Payer: Self-pay | Admitting: Adult Health

## 2015-06-11 ENCOUNTER — Ambulatory Visit (INDEPENDENT_AMBULATORY_CARE_PROVIDER_SITE_OTHER): Payer: Medicare Other | Admitting: Adult Health

## 2015-06-11 ENCOUNTER — Ambulatory Visit: Payer: Medicare Other | Admitting: Adult Health

## 2015-06-11 VITALS — BP 120/68 | Temp 98.1°F | Ht 62.0 in | Wt 245.4 lb

## 2015-06-11 DIAGNOSIS — R269 Unspecified abnormalities of gait and mobility: Secondary | ICD-10-CM | POA: Diagnosis not present

## 2015-06-11 DIAGNOSIS — I10 Essential (primary) hypertension: Secondary | ICD-10-CM | POA: Diagnosis not present

## 2015-06-11 DIAGNOSIS — E1165 Type 2 diabetes mellitus with hyperglycemia: Secondary | ICD-10-CM

## 2015-06-11 DIAGNOSIS — E7800 Pure hypercholesterolemia, unspecified: Secondary | ICD-10-CM

## 2015-06-11 DIAGNOSIS — Z0000 Encounter for general adult medical examination without abnormal findings: Secondary | ICD-10-CM

## 2015-06-11 DIAGNOSIS — H9190 Unspecified hearing loss, unspecified ear: Secondary | ICD-10-CM

## 2015-06-11 DIAGNOSIS — IMO0001 Reserved for inherently not codable concepts without codable children: Secondary | ICD-10-CM

## 2015-06-11 LAB — POCT URINALYSIS DIPSTICK
Bilirubin, UA: NEGATIVE
Glucose, UA: NEGATIVE
Ketones, UA: NEGATIVE
Nitrite, UA: POSITIVE
Spec Grav, UA: 1.025
Urobilinogen, UA: 0.2
pH, UA: 5.5

## 2015-06-11 LAB — CBC WITH DIFFERENTIAL/PLATELET
Basophils Absolute: 0 10*3/uL (ref 0.0–0.1)
Basophils Relative: 0.6 % (ref 0.0–3.0)
Eosinophils Absolute: 0.2 10*3/uL (ref 0.0–0.7)
Eosinophils Relative: 3.8 % (ref 0.0–5.0)
HCT: 32.6 % — ABNORMAL LOW (ref 36.0–46.0)
Hemoglobin: 11 g/dL — ABNORMAL LOW (ref 12.0–15.0)
Lymphocytes Relative: 21.3 % (ref 12.0–46.0)
Lymphs Abs: 1.1 10*3/uL (ref 0.7–4.0)
MCHC: 33.7 g/dL (ref 30.0–36.0)
MCV: 87.9 fl (ref 78.0–100.0)
Monocytes Absolute: 0.3 10*3/uL (ref 0.1–1.0)
Monocytes Relative: 5.8 % (ref 3.0–12.0)
Neutro Abs: 3.6 10*3/uL (ref 1.4–7.7)
Neutrophils Relative %: 68.5 % (ref 43.0–77.0)
Platelets: 100 10*3/uL — ABNORMAL LOW (ref 150.0–400.0)
RBC: 3.71 Mil/uL — ABNORMAL LOW (ref 3.87–5.11)
RDW: 18.4 % — ABNORMAL HIGH (ref 11.5–15.5)
WBC: 5.2 10*3/uL (ref 4.0–10.5)

## 2015-06-11 LAB — BASIC METABOLIC PANEL
BUN: 26 mg/dL — ABNORMAL HIGH (ref 6–23)
CO2: 29 mEq/L (ref 19–32)
Calcium: 9.8 mg/dL (ref 8.4–10.5)
Chloride: 98 mEq/L (ref 96–112)
Creatinine, Ser: 1.41 mg/dL — ABNORMAL HIGH (ref 0.40–1.20)
GFR: 38.35 mL/min — ABNORMAL LOW (ref >60.00)
Glucose, Bld: 204 mg/dL — ABNORMAL HIGH (ref 70–99)
Potassium: 4.2 mEq/L (ref 3.5–5.1)
Sodium: 139 mEq/L (ref 135–145)

## 2015-06-11 LAB — HEPATIC FUNCTION PANEL
ALT: 14 U/L (ref 0–35)
AST: 18 U/L (ref 0–37)
Albumin: 4 g/dL (ref 3.5–5.2)
Alkaline Phosphatase: 59 U/L (ref 39–117)
Bilirubin, Direct: 0.1 mg/dL (ref 0.0–0.3)
Total Bilirubin: 0.6 mg/dL (ref 0.2–1.2)
Total Protein: 7.4 g/dL (ref 6.0–8.3)

## 2015-06-11 LAB — LIPID PANEL
Cholesterol: 310 mg/dL — ABNORMAL HIGH (ref 0–200)
HDL: 37.9 mg/dL — ABNORMAL LOW (ref >39.00)
Total CHOL/HDL Ratio: 8
Triglycerides: 1026 mg/dL — ABNORMAL HIGH (ref 0.0–149.0)

## 2015-06-11 LAB — TSH: TSH: 2.19 u[IU]/mL (ref 0.35–4.50)

## 2015-06-11 LAB — LDL CHOLESTEROL, DIRECT: Direct LDL: 79 mg/dL

## 2015-06-11 LAB — HEMOGLOBIN A1C: Hgb A1c MFr Bld: 6.9 % — ABNORMAL HIGH (ref 4.6–6.5)

## 2015-06-11 NOTE — Progress Notes (Signed)
Subjective:  Patient presents today for their annual wellness visit.    Preventive Screening-Counseling & Management  Smoking Status: Former Smoker  Second Engineer, manufacturing Smoking status: No smokers in home  Risk Factors Regular exercise: Yoga and stationary bike two times a week.  Diet: Eats healthy Fall Risk: Yes, walks with walker  Cardiac risk factors:  advanced age (older than 71 for men, 77 for women)  Hyperlipidemia Diabetes.  Family History: Hyperlipidemia, hypertension  Depression Screen None. PHQ2 0   Activities of Daily Living Independent ADLs and IADLs  Hearing Difficulties: Patient endorses that she thinks that she is becoming hard of hearing.   Cognitive Testing No reported trouble.   Normal 3 word recall  List the Names of Other Physician/Practitioners you currently use: 1.Endocrynologist - Dr. Chalmers Cater 2. Optho - Dr. Kathrin Penner  She is scheduling her eye appointment, bone density and mammogram.  Has been in the ER three times throughout the last year for kidney stones.   She does have advanced directives in place.    Immunization History  Administered Date(s) Administered  . Influenza Split 06/09/2011  . Influenza Whole 07/23/2008, 06/13/2009, 06/07/2012  . Influenza, High Dose Seasonal PF 05/08/2015  . Pneumococcal Polysaccharide-23 01/30/2008  . Pneumococcal-Unspecified 09/07/2013  . Td 01/25/2006  . Zoster 06/09/2011   Required Immunizations needed today:  UTD  Screening tests- up to date Health Maintenance Due  Topic Date Due  . DEXA SCAN  08/04/2002  . PNA vac Low Risk Adult (2 of 2 - PCV13) 01/29/2009  . OPHTHALMOLOGY EXAM  10/22/2011  . HEMOGLOBIN A1C  03/24/2012  . FOOT EXAM  06/08/2012  . URINE MICROALBUMIN  09/24/2012    ROS- No pertinent positives discovered in course of AWV  The following were reviewed and entered/updated in epic: Past Medical History  Diagnosis Date  . Unspecified hypothyroidism   . Pure  hypercholesterolemia   . Unspecified essential hypertension   . Syncope and collapse   . Type II or unspecified type diabetes mellitus without mention of complication, not stated as uncontrolled   . Depressive disorder, not elsewhere classified   . Extrinsic asthma, unspecified     no problem since adulthood  . Obesity   . Anxiety state, unspecified     panic attacks  . OSA on CPAP     severe  . Allergic rhinitis   . Respiratory failure with hypoxia ([place]) 09/2008    acute, secondary to multiple bilateral pulmonary embolism , negative hypercoagulable workup 09/2008 hospital stay   Patient Active Problem List   Diagnosis Date Noted  . Diabetes mellitus type 2, uncontrolled ([place]) 08/05/2010  . GOUT 06/23/2010  . DERMATITIS 01/16/2010  . BACK PAIN 06/13/2009  . MYALGIA 06/13/2009  . ANXIETY 12/13/2008  . LEG EDEMA, CHRONIC 11/08/2008  . SLEEP APNEA 10/26/2008  . HYPOTHYROIDISM 09/27/2008  . HYPERCHOLESTEROLEMIA 09/27/2008  . OBESITY 09/27/2008  . DEPRESSION 09/27/2008  . HYPERTENSION 09/27/2008  . ALLERGIC RHINITIS 09/27/2008  . ASTHMA, CHILDHOOD 09/27/2008   Past Surgical History  Procedure Laterality Date  . Thyroid surgery    . Knee replaced    . Eye surgery    . Joint replacement      Family History  Problem Relation Age of Onset  . Arthritis Other   . Diabetes Other   . Hyperlipidemia Other   . Hypertension Other   . Coronary artery disease Other   . Stroke Other   . Allergies Mother   . Clotting disorder Mother   .  Osteoarthritis Mother   . Asthma Mother   . Osteoarthritis Daughter   . Rheum arthritis Maternal Grandmother   . Clotting disorder Maternal Grandmother   . Clotting disorder Maternal Uncle   . Clotting disorder Daughter   . Allergies Daughter     Medications- reviewed and updated Current Outpatient Prescriptions  Medication Sig Dispense Refill  . Coenzyme Q10 200 MG capsule Take 200 mg by mouth daily.      . fexofenadine (ALLEGRA) 180 MG  tablet Take 180 mg by mouth daily.      . insulin NPH-regular Human (HUMULIN 70/30) (70-30) 100 UNIT/ML injection 65 units in the morning and 30 units in the evening 10 mL 5  . Insulin Pen Needle (B-D UF III MINI PEN NEEDLES) 31G X 5 MM MISC Use daily for insulin injection 100 each 2  . levothyroxine (SYNTHROID, LEVOTHROID) 112 MCG tablet TK 1 T PO D  2  . losartan (COZAAR) 25 MG tablet TAKE 1 TABLET BY MOUTH EVERY DAY 90 tablet 1  . Multiple Vitamin (MULTIVITAMIN) tablet Take 1 tablet by mouth daily.      Marland Kitchen oxyCODONE (OXY IR/ROXICODONE) 5 MG immediate release tablet Take 5 mg by mouth every 4 (four) hours as needed for severe pain.    . potassium citrate (UROCIT-K) 10 MEQ (1080 MG) SR tablet TK 1 T PO TID WITH MEALS  3  . ranitidine (ZANTAC) 150 MG capsule Take 150 mg by mouth 2 (two) times daily as needed.      . sertraline (ZOLOFT) 50 MG tablet Take 50 mg by mouth daily.    . tamsulosin (FLOMAX) 0.4 MG CAPS capsule Take 0.4 mg by mouth daily.  3  . oxybutynin (DITROPAN) 5 MG tablet Take 5 mg by mouth 3 (three) times daily.    Marland Kitchen tiZANidine (ZANAFLEX) 4 MG tablet Take 1 tablet (4 mg total) by mouth every 6 (six) hours as needed for muscle spasms. (Patient not taking: Reported on 06/11/2015) 30 tablet 0   No current facility-administered medications for this visit.    Allergies-reviewed and updated Allergies  Allergen Reactions  . Aspirin   . Contrast Media [Iodinated Diagnostic Agents]   . Flagyl [Metronidazole]   . Fluticasone-Salmeterol   . Lactose Intolerance (Gi)   . Latex   . Lisinopril     REACTION: cough  . Metformin     REACTION: nausea  . Sulfonamide Derivatives   . Tetracycline     Social History   Social History  . Marital Status: Widowed    Spouse Name: N/A  . Number of Children: 2  . Years of Education: N/A   Occupational History  . 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 Smoking status: Former Smoker -- 20 years      Quit date: 09/07/1989  . Smokeless tobacco: None  . Alcohol Use: No  . Drug Use: No  . Sexual Activity: Not Asked   Other Topics Concern  . None   Social History Narrative    Objective: BP 120/68 mmHg  Temp(Src) 98.1 F (36.7 C) (Oral)  Ht 5\' 2"  (1.575 m)  Wt 245 lb 6.4 oz (111.313 kg)  BMI 44.87 kg/m2 GENERAL: vitals reviewed and listed above, alert, oriented, appears well hydrated and in no acute distress. She appears more upbeat than in previous visits.   HEENT: atraumatic, conjunttiva clear, no obvious abnormalities on inspection of external nose and ears  NECK: Neck  is soft and supple without masses, no adenopathy or thyromegaly, trachea midline, no JVD. Normal range of motion.   LUNGS: clear to auscultation bilaterally, no wheezes, rales or rhonchi, good air movement  CV: Regular rate and rhythm, normal S1/S2, no audible murmurs, gallops, or rubs. No carotid bruit and no peripheral edema.   MS: moves all extremities without noticeable abnormality. No edema noted  Skin: warm and dry, no rash .   Extremities: No clubbing, cyanosis, or edema. Capillary refill is WNL. Pulses intact bilaterally in upper and lower extremities.   Neuro: CN II-XII intact, sensation and reflexes normal throughout, 5/5 muscle strength in bilateral upper and lower extremities. Normal finger to nose. Normal rapid alternating movements. Normal romberg. No pronator drift.   PSYCH: pleasant and cooperative, no obvious depression or anxiety   Assessment/Plan:  1. Essential hypertension - No change in medication - Needs to lose weight - Basic metabolic panel - Hemoglobin A1c - Hepatic function panel - Lipid panel - TSH - POCT urinalysis dipstick - EKG 12-Lead  2. HYPERCHOLESTEROLEMIA - Basic metabolic panel - CBC with Differential/Platelet - Lipid panel - Consider adding a statin medication  3. Uncontrolled type 2 diabetes mellitus without complication, without long-term current  use of insulin (HCC) - Basic metabolic panel - Hemoglobin A1c - Hepatic function panel - Lipid panel - TSH - POCT urinalysis dipstick - Consider sending back to Endorcrinology 4. Abnormality of gait - Ambulatory referral to Physical Therapy - Continue to use walker 5. Hearing decreased, unspecified laterality - Ambulatory referral to Audiology  6. Medicare annual wellness visit, subsequent - Continue to exercise and increase the amount of days she exercises - Eat a healthy diet.   Return precautions advised.   These are the goals we discussed: Goals    . Decrease the likelihood of falling    . Exercise 150 minutes per week (moderate activity)       This is a list of the screening recommended for you and due dates:  Health Maintenance  Topic Date Due  . DEXA scan (bone density measurement)  08/04/2002  . Eye exam for diabetics  10/22/2011  . Complete foot exam   06/08/2012  . Urine Protein Check  09/24/2012  . Pneumonia vaccines (2 of 2 - PCV13) 09/07/2014  . Hemoglobin A1C  12/10/2015  . Tetanus Vaccine  01/26/2016  . Flu Shot  04/07/2016  . Shingles Vaccine  Completed

## 2015-06-11 NOTE — Patient Instructions (Signed)
It was great seeing you again today!  I will follow up with you regarding labs and at that time we can decide about Endocrinology.   Follow up with me in 6 months or sooner if needed.   Someone will call you about PT and the hearing exam.

## 2015-06-12 ENCOUNTER — Telehealth: Payer: Self-pay | Admitting: Adult Health

## 2015-06-12 ENCOUNTER — Other Ambulatory Visit: Payer: Self-pay | Admitting: Adult Health

## 2015-06-12 MED ORDER — FENOFIBRATE 54 MG PO TABS: 54.0000 mg | ORAL_TABLET | Freq: Every day | ORAL | Status: DC

## 2015-06-12 MED ORDER — ATORVASTATIN CALCIUM 20 MG PO TABS: 20.0000 mg | ORAL_TABLET | Freq: Every day | ORAL | Status: DC

## 2015-06-12 NOTE — Telephone Encounter (Signed)
Spoke to patient and notified her of her labs. We will start Lipitor 20 mg and Fenofibrate 54 mg, recheck lipids. Stressed importance of diet and exercise.

## 2015-06-15 DIAGNOSIS — Z87442 Personal history of urinary calculi: Secondary | ICD-10-CM | POA: Diagnosis not present

## 2015-06-15 DIAGNOSIS — Z48816 Encounter for surgical aftercare following surgery on the genitourinary system: Secondary | ICD-10-CM | POA: Diagnosis not present

## 2015-06-25 DIAGNOSIS — N200 Calculus of kidney: Secondary | ICD-10-CM | POA: Diagnosis not present

## 2015-07-01 ENCOUNTER — Other Ambulatory Visit: Payer: Self-pay | Admitting: Adult Health

## 2015-07-10 DIAGNOSIS — N200 Calculus of kidney: Secondary | ICD-10-CM | POA: Diagnosis not present

## 2015-07-10 DIAGNOSIS — Z87891 Personal history of nicotine dependence: Secondary | ICD-10-CM | POA: Diagnosis not present

## 2015-07-10 DIAGNOSIS — Z09 Encounter for follow-up examination after completed treatment for conditions other than malignant neoplasm: Secondary | ICD-10-CM | POA: Diagnosis not present

## 2015-07-10 DIAGNOSIS — R82991 Hypocitraturia: Secondary | ICD-10-CM | POA: Insufficient documentation

## 2015-07-10 DIAGNOSIS — M109 Gout, unspecified: Secondary | ICD-10-CM | POA: Diagnosis not present

## 2015-07-10 DIAGNOSIS — Z87442 Personal history of urinary calculi: Secondary | ICD-10-CM | POA: Diagnosis not present

## 2015-07-10 DIAGNOSIS — N281 Cyst of kidney, acquired: Secondary | ICD-10-CM | POA: Diagnosis not present

## 2015-07-10 DIAGNOSIS — R34 Anuria and oliguria: Secondary | ICD-10-CM | POA: Insufficient documentation

## 2015-07-10 DIAGNOSIS — Z79899 Other long term (current) drug therapy: Secondary | ICD-10-CM | POA: Diagnosis not present

## 2015-08-07 ENCOUNTER — Ambulatory Visit: Payer: Medicare Other | Attending: Adult Health

## 2015-08-07 DIAGNOSIS — R531 Weakness: Secondary | ICD-10-CM | POA: Diagnosis present

## 2015-08-07 DIAGNOSIS — R269 Unspecified abnormalities of gait and mobility: Secondary | ICD-10-CM | POA: Insufficient documentation

## 2015-08-07 DIAGNOSIS — Z7409 Other reduced mobility: Secondary | ICD-10-CM | POA: Diagnosis not present

## 2015-08-07 DIAGNOSIS — R6889 Other general symptoms and signs: Secondary | ICD-10-CM

## 2015-08-07 NOTE — Patient Instructions (Signed)
KNEE: Extension, Long Arc Quad (Weight)  Place weight around leg. Raise leg until knee is straight. Hold _5__ seconds. Use ___ lb weight. _10__ reps per set (each leg), 4-5__ sets per day, __7_ days per week  Copyright  VHI. All rights reserved.    Reverse Fly / Shoulder Retraction   Extend both arms in front of body at shoulder height, palms down, holding band. Move arms out to sides, squeeze shoulder blades together. Repeat _10__ times. Do _4-5__ sessions per day.   Copyright  VHI. All rights reserved.     Knee Raise   Lift knee and then lower it. Repeat with other knee. Repeat _10__ times each leg. Do _4-5___ sessions per day.  http://gt2.exer.us/445   Copyright  VHI. All rights reserved.  Toe Up   Gently rise up on toes and back on heels. Repeat _20___ times. Do 4-5____ sessions per day.  [facility] [address], [address] [place], [place] [address] Phone # [phone] Fax [phone]  

## 2015-08-07 NOTE — Therapy (Signed)
[facility] [address], [place], [place], [address] Phone: [phone]   Fax:  [phone]  Physical Therapy Evaluation  Patient Details  Name: [name] MRN: [id number] Date of Birth: 09-13-1934 Referring Provider: Nani Skillern, NP  Encounter Date: 08/07/2015      PT End of Session - 08/07/15 1624    Visit Number 1   Number of Visits 10   Date for PT Re-Evaluation 10/02/15   PT Start Time [id number]   PT Stop Time 1620   PT Time Calculation (min) 45 min   Activity Tolerance Patient tolerated treatment well   Behavior During Therapy [facility] for tasks assessed/performed      Past Medical History  Diagnosis Date  . Unspecified hypothyroidism   . Pure hypercholesterolemia   . Unspecified essential hypertension   . Syncope and collapse   . Type II or unspecified type diabetes mellitus without mention of complication, not stated as uncontrolled   . Depressive disorder, not elsewhere classified   . Extrinsic asthma, unspecified     no problem since adulthood  . Obesity   . Anxiety state, unspecified     panic attacks  . OSA on CPAP     severe  . Allergic rhinitis   . Respiratory failure with hypoxia ([place]) 09/2008    acute, secondary to multiple bilateral pulmonary embolism , negative hypercoagulable workup 09/2008 hospital stay  . Scoliosis     Past Surgical History  Procedure Laterality Date  . Thyroid surgery    . Knee replaced    . Eye surgery    . Joint replacement      There were no vitals filed for this visit.  Visit Diagnosis:  Abnormality of gait - Plan: PT plan of care cert/re-cert  Decreased strength, endurance, and mobility - Plan: PT plan of care cert/re-cert      Subjective Assessment - 08/07/15 1546    Subjective Pt presents to PT with gait abnormality.  Pt was using a wheelchair due to weakness from surgeries.  Pt had 6 surgeries on her kidneys 06/2014-04/2015 due to kidney stones.   Pt has weaned from wheelchair to a walker for most distances.  Pt is using a cane today.     Pertinent History 6 surgeries on kidneys 06/2014-04/2015.  Significant weakness as result.     Limitations Standing;Walking   How long can you stand comfortably? limited by shortness of breath   How long can you walk comfortably? limited by shortness of breath   Patient Stated Goals improve balance, wean from device, improve safety in the community   Currently in Pain? No/denies            [facility] PT Assessment - 08/07/15 0001    Assessment   Medical Diagnosis abnormality of gait   Referring Provider Nani Skillern, NP   Onset Date/Surgical Date 07/06/14   Next MD Visit 12/2014   Prior Therapy no outpatient   Precautions   Precautions Fall   Restrictions   Weight Bearing Restrictions No   Balance Screen   Has the patient fallen in the past 6 months No   Has the patient had a decrease in activity level because of a fear of falling?  No   Is the patient reluctant to leave their home because of a fear of falling?  No   Home Environment   Living Environment Private residence   Living Arrangements Alone   Type of [place]  Access Ramped entrance   Home Layout One level   [place] - 2 wheels;Cane - single point;Shower seat;Wheelchair - manual   Prior Function   Level of Independence Independent   Vocation Retired   Charity fundraiser Status Within Functional Limits for tasks assessed   Posture/Postural Control   Posture/Postural Control Postural limitations   Postural Limitations Rounded Shoulders;Forward head   ROM / Strength   AROM / PROM / Strength AROM;Strength   AROM   Overall AROM  Within functional limits for tasks performed   Strength   Overall Strength Within functional limits for tasks performed   Overall Strength Comments Bil. LE strength is 4+/5 to 5/5 throughout.  UE strength 4+/5   Transfers   Transfers Sit to Stand;Stand to Sit   Sit to  Stand With upper extremity assist;With armrests;6: Modified independent (Device/Increase time)   Ambulation/Gait   Ambulation/Gait Yes   Ambulation/Gait Assistance 6: Modified independent (Device/Increase time)   Ambulation Distance (Feet) 75 Feet   Assistive device Straight cane   Gait Pattern Step-through pattern;Decreased dorsiflexion - right;Decreased dorsiflexion - left;Right circumduction;Left circumduction   Balance   Balance Assessed Yes   Standardized Balance Assessment   Standardized Balance Assessment Timed Up and Go Test;Five Times Sit to Stand   Five times sit to stand comments  13 seconds   Timed Up and Go Test   TUG Normal TUG   Normal TUG (seconds) 15   TUG Comments use of cane                           PT Education - 08/07/15 1612    Education provided Yes   Education Details HEP: seated strength   Person(s) Educated Patient   Methods Explanation;Demonstration;Handout   Comprehension Verbalized understanding;Returned demonstration          PT Short Term Goals - 08/07/15 1630    PT SHORT TERM GOAL #1   Title be independent in initial HEP   Time 4   Period Weeks   Status New   PT SHORT TERM GOAL #2   Title walk for 5-8 minutes without fatigue   Time 4   Period Weeks   Status New           PT Long Term Goals - 08/07/15 1631    PT LONG TERM GOAL #1   Title be independent in advanced HEP   Time 8   Period Weeks   Status New   PT LONG TERM GOAL #2   Title perform  TUG in < or = to 12 seconds to reduce falls risk   Time 8   Period Weeks   Status New   PT LONG TERM GOAL #3   Title walk for 10-15 minutes without fatigue   Time 8   Period Weeks   Status New   PT LONG TERM GOAL #4   Title perform 5x sit to stand in < or = to 12 seconds   Time 8   Period Weeks   Status New   PT LONG TERM GOAL #5   Title improve gait and safety to wean from cane for home distances   Time 8   Period Weeks               Plan -  08/07/15 1626    Clinical Impression Statement Pt presents to PT with complaints of LE weakness, endurance deficits and balance deficits that  began 06/2014.  Pt has  6 surgeries in a 10 month period and lost a significant amount of endurance and strength.  Pt was using a wheelchair until recently and now uses a walker and cane for ambulation.  Pt is limited in the community to < 5 minutes of walking.  TUG is 15 seconds and 5x sit to stand is 13 seconds .  Pt will benefit from skilled PT for strength, endurance and balance training to improve stability/safety and independence in the community   Pt will benefit from skilled therapeutic intervention in order to improve on the following deficits Abnormal gait;Decreased strength;Decreased mobility;Decreased balance;Decreased activity tolerance;Decreased endurance   Rehab Potential Good   PT Frequency 2x / week   PT Duration 8 weeks   PT Treatment/Interventions ADLs/Self Care Home Management;Cryotherapy;Moist Heat;Therapeutic exercise;Therapeutic activities;Functional mobility training;Manual techniques;Patient/family education;Neuromuscular re-education;Passive range of motion;Stair training;Gait training   PT Next Visit Plan LE strength, endurance, balance training   Consulted and Agree with Plan of Care Patient          G-Codes - August 26, 2015 1625    Functional Assessment Tool Used TUG: 15 seconds, 5x sit to stand 13 seconds   Functional Limitation Mobility: Walking and moving around   Mobility: Walking and Moving Around Current Status [phone]) At least 40 percent but less than 60 percent impaired, limited or restricted   Mobility: Walking and Moving Around Goal Status [phone]) At least 40 percent but less than 60 percent impaired, limited or restricted       Problem List Patient Active Problem List   Diagnosis Date Noted  . Diabetes type 2, uncontrolled ([place]) 08/05/2010  . GOUT 06/23/2010  . DERMATITIS 01/16/2010  . BACK PAIN 06/13/2009  .  MYALGIA 06/13/2009  . ANXIETY 12/13/2008  . LEG EDEMA, CHRONIC 11/08/2008  . SLEEP APNEA 10/26/2008  . HYPOTHYROIDISM 09/27/2008  . HYPERCHOLESTEROLEMIA 09/27/2008  . OBESITY 09/27/2008  . DEPRESSION 09/27/2008  . Essential hypertension 09/27/2008  . ALLERGIC RHINITIS 09/27/2008  . ASTHMA, CHILDHOOD 09/27/2008    TAKACS,KELLY, PT 08/26/2015, 4:35 PM  [facility] [address] [place], [place], [address] Phone: [phone]   Fax:  [phone]  Name: [name] MRN: [id number] Date of Birth: 1935/03/24

## 2015-08-09 ENCOUNTER — Ambulatory Visit: Payer: Medicare Other | Attending: Adult Health | Admitting: Physical Therapy

## 2015-08-09 ENCOUNTER — Encounter: Payer: Self-pay | Admitting: Physical Therapy

## 2015-08-09 DIAGNOSIS — R6889 Other general symptoms and signs: Secondary | ICD-10-CM

## 2015-08-09 DIAGNOSIS — R269 Unspecified abnormalities of gait and mobility: Secondary | ICD-10-CM | POA: Diagnosis not present

## 2015-08-09 DIAGNOSIS — R531 Weakness: Secondary | ICD-10-CM

## 2015-08-09 DIAGNOSIS — Z7409 Other reduced mobility: Secondary | ICD-10-CM | POA: Insufficient documentation

## 2015-08-09 NOTE — Therapy (Signed)
[facility] [address] [place], [place], [address] Phone: [phone]   Fax:  [phone]  Physical Therapy Treatment  Patient Details  Name: [name] MRN: [id number] Date of Birth: [birth year] Referring Provider: Nani Skillern, NP  Encounter Date: 08/09/2015      PT End of Session - 08/09/15 1028    Visit Number 2   Number of Visits 10   Date for PT Re-Evaluation 10/02/15   PT Start Time 1025   PT Stop Time 1100   PT Time Calculation (min) 35 min   Activity Tolerance Patient tolerated treatment well   Behavior During Therapy [facility] for tasks assessed/performed      Past Medical History  Diagnosis Date  . Unspecified hypothyroidism   . Pure hypercholesterolemia   . Unspecified essential hypertension   . Syncope and collapse   . Type II or unspecified type diabetes mellitus without mention of complication, not stated as uncontrolled   . Depressive disorder, not elsewhere classified   . Extrinsic asthma, unspecified     no problem since adulthood  . Obesity   . Anxiety state, unspecified     panic attacks  . OSA on CPAP     severe  . Allergic rhinitis   . Respiratory failure with hypoxia ([place]) 09/2008    acute, secondary to multiple bilateral pulmonary embolism , negative hypercoagulable workup 09/2008 hospital stay  . Scoliosis     Past Surgical History  Procedure Laterality Date  . Thyroid surgery    . Knee replaced    . Eye surgery    . Joint replacement      There were no vitals filed for this visit.  Visit Diagnosis:  Abnormality of gait  Decreased strength, endurance, and mobility      Subjective Assessment - 08/09/15 1028    Subjective I'm just a little SOB this morning. No pain, reports the muscles around her diaphram spasm when she gets up.    Multiple Pain Sites No                         [facility] Adult PT Treatment/Exercise - 08/09/15 0001    Ambulation/Gait    Ambulation Distance (Feet) --  2 min without rest break using cane   Knee/Hip Exercises: Aerobic   Nustep L1 x 6 min   Knee/Hip Exercises: Standing   Rebounder wt shifting 1 min 3 ways   Used 4 inch box to assit getting on, HHA needed   Knee/Hip Exercises: Seated   Long Arc Quad Strengthening;Both;2 sets;10 reps   Ball Squeeze 10x 3 sec hold with VC to sit erect.   Sit to Sand 10 reps;with UE support                  PT Short Term Goals - 08/07/15 1630    PT SHORT TERM GOAL #1   Title be independent in initial HEP   Time 4   Period Weeks   Status New   PT SHORT TERM GOAL #2   Title walk for 5-8 minutes without fatigue   Time 4   Period Weeks   Status New           PT Long Term Goals - 08/07/15 1631    PT LONG TERM GOAL #1   Title be independent in advanced HEP   Time 8   Period Weeks   Status New   PT LONG TERM  GOAL #2   Title perform  TUG in < or = to 12 seconds to reduce falls risk   Time 8   Period Weeks   Status New   PT LONG TERM GOAL #3   Title walk for 10-15 minutes without fatigue   Time 8   Period Weeks   Status New   PT LONG TERM GOAL #4   Title perform 5x sit to stand in < or = to 12 seconds   Time 8   Period Weeks   Status New   PT LONG TERM GOAL #5   Title improve gait and safety to wean from cane for home distances   Time 8   Period Weeks               Plan - 08/09/15 1038    Clinical Impression Statement pt here for first full treatment. She was slightly late, 10 min. She did have some SOB especially with walking  2 min in th eclinic. She is compliant with her HEP.    Pt will benefit from skilled therapeutic intervention in order to improve on the following deficits Abnormal gait;Decreased strength;Decreased mobility;Decreased balance;Decreased activity tolerance;Decreased endurance   Rehab Potential Good   PT Frequency 2x / week   PT Duration 8 weeks   PT Treatment/Interventions ADLs/Self Care Home  Management;Cryotherapy;Moist Heat;Therapeutic exercise;Therapeutic activities;Functional mobility training;Manual techniques;Patient/family education;Neuromuscular re-education;Passive range of motion;Stair training;Gait training   PT Next Visit Plan LE strength, endurance, balance training   Consulted and Agree with Plan of Care Patient        Problem List Patient Active Problem List   Diagnosis Date Noted  . Diabetes type 2, uncontrolled ([place]) 08/05/2010  . GOUT 06/23/2010  . DERMATITIS 01/16/2010  . BACK PAIN 06/13/2009  . MYALGIA 06/13/2009  . ANXIETY 12/13/2008  . LEG EDEMA, CHRONIC 11/08/2008  . SLEEP APNEA 10/26/2008  . HYPOTHYROIDISM 09/27/2008  . HYPERCHOLESTEROLEMIA 09/27/2008  . OBESITY 09/27/2008  . DEPRESSION 09/27/2008  . Essential hypertension 09/27/2008  . ALLERGIC RHINITIS 09/27/2008  . ASTHMA, CHILDHOOD 09/27/2008    [name], PTA 08/09/2015, 11:04 AM  [facility] [address] [place], [place], [address] Phone: [phone]   Fax:  [phone]  Name: [name] MRN: [id number] Date of Birth: November 27, 1934

## 2015-08-12 ENCOUNTER — Ambulatory Visit: Payer: Medicare Other | Admitting: Physical Therapy

## 2015-08-12 ENCOUNTER — Encounter: Payer: Self-pay | Admitting: Physical Therapy

## 2015-08-12 DIAGNOSIS — R269 Unspecified abnormalities of gait and mobility: Secondary | ICD-10-CM

## 2015-08-12 DIAGNOSIS — Z7409 Other reduced mobility: Secondary | ICD-10-CM

## 2015-08-12 DIAGNOSIS — R6889 Other general symptoms and signs: Secondary | ICD-10-CM

## 2015-08-12 DIAGNOSIS — R531 Weakness: Secondary | ICD-10-CM

## 2015-08-12 NOTE — Therapy (Signed)
[facility] [address] [place], [place], [address] Phone: [phone]   Fax:  [phone]  Physical Therapy Treatment  Patient Details  Name: [name] MRN: [id number] Date of Birth: 1935/04/10 Referring Provider: Nani Skillern, NP  Encounter Date: 08/12/2015      PT End of Session - 08/12/15 1115    Visit Number 3   Number of Visits 10   Date for PT Re-Evaluation 10/02/15   PT Start Time 1113  13 min late   PT Stop Time 1145   PT Time Calculation (min) 32 min   Activity Tolerance Patient tolerated treatment well   Behavior During Therapy [facility] for tasks assessed/performed      Past Medical History  Diagnosis Date  . Unspecified hypothyroidism   . Pure hypercholesterolemia   . Unspecified essential hypertension   . Syncope and collapse   . Type II or unspecified type diabetes mellitus without mention of complication, not stated as uncontrolled   . Depressive disorder, not elsewhere classified   . Extrinsic asthma, unspecified     no problem since adulthood  . Obesity   . Anxiety state, unspecified     panic attacks  . OSA on CPAP     severe  . Allergic rhinitis   . Respiratory failure with hypoxia ([place]) 09/2008    acute, secondary to multiple bilateral pulmonary embolism , negative hypercoagulable workup 09/2008 hospital stay  . Scoliosis     Past Surgical History  Procedure Laterality Date  . Thyroid surgery    . Knee replaced    . Eye surgery    . Joint replacement      There were no vitals filed for this visit.  Visit Diagnosis:  Abnormality of gait  Decreased strength, endurance, and mobility      Subjective Assessment - 08/12/15 1116    Subjective Felt grewat after last treatment. Running late today so pt presents SOB.   Currently in Pain? No/denies   Multiple Pain Sites No                         [facility] Adult PT Treatment/Exercise - 08/12/15 0001    Knee/Hip Exercises: Aerobic   Nustep L1 x 7 min   Knee/Hip Exercises: Standing   Hip ADduction --  5x bil    Knee/Hip Exercises: Seated   Long Arc Quad Strengthening;Both;2 sets;10 reps   Long Arc Quad Weight 2 lbs.   Ball Squeeze 2 x10 3 sec hold   Abduction/Adduction  --  Red band hip abd 2x 10    Sit to Sand 10 reps;without UE support                  PT Short Term Goals - 08/12/15 1124    PT SHORT TERM GOAL #1   Title be independent in initial HEP   Time 4   Period Weeks   Status Achieved   PT SHORT TERM GOAL #2   Title walk for 5-8 minutes without fatigue   Time 4   Period Weeks   Status On-going  2-3 min           PT Long Term Goals - 08/07/15 1631    PT LONG TERM GOAL #1   Title be independent in advanced HEP   Time 8   Period Weeks   Status New   PT LONG TERM GOAL #2   Title perform  TUG in <  or = to 12 seconds to reduce falls risk   Time 8   Period Weeks   Status New   PT LONG TERM GOAL #3   Title walk for 10-15 minutes without fatigue   Time 8   Period Weeks   Status New   PT LONG TERM GOAL #4   Title perform 5x sit to stand in < or = to 12 seconds   Time 8   Period Weeks   Status New   PT LONG TERM GOAL #5   Title improve gait and safety to wean from cane for home distances   Time 8   Period Weeks               Plan - 08/12/15 1125    Clinical Impression Statement Pt reports no extra fatigue from her first workout. She struggles with SOB when wqalking long distances. very compliant with her exercises. Diaphram spasms have abolished since doing her posture exercises and sidebend stretching.    Pt will benefit from skilled therapeutic intervention in order to improve on the following deficits Abnormal gait;Decreased strength;Decreased mobility;Decreased balance;Decreased activity tolerance;Decreased endurance   Rehab Potential Good   PT Frequency 2x / week   PT Duration 8 weeks   PT Next Visit Plan LE strength, endurance,  balance training   Consulted and Agree with Plan of Care Patient        Problem List Patient Active Problem List   Diagnosis Date Noted  . Diabetes type 2, uncontrolled ([place]) 08/05/2010  . GOUT 06/23/2010  . DERMATITIS 01/16/2010  . BACK PAIN 06/13/2009  . MYALGIA 06/13/2009  . ANXIETY 12/13/2008  . LEG EDEMA, CHRONIC 11/08/2008  . SLEEP APNEA 10/26/2008  . HYPOTHYROIDISM 09/27/2008  . HYPERCHOLESTEROLEMIA 09/27/2008  . OBESITY 09/27/2008  . DEPRESSION 09/27/2008  . Essential hypertension 09/27/2008  . ALLERGIC RHINITIS 09/27/2008  . ASTHMA, CHILDHOOD 09/27/2008    COCHRAN,JENNIFER, PTA 08/12/2015, 11:38 AM  [facility] [address] [place], [place], [address] Phone: [phone]   Fax:  [phone]  Name: TOYA DALRYMPLE MRN: [id number] Date of Birth: Feb 15, 1935

## 2015-08-14 ENCOUNTER — Encounter: Payer: Self-pay | Admitting: Physical Therapy

## 2015-08-14 ENCOUNTER — Ambulatory Visit: Payer: Medicare Other | Admitting: Physical Therapy

## 2015-08-14 DIAGNOSIS — R6889 Other general symptoms and signs: Secondary | ICD-10-CM

## 2015-08-14 DIAGNOSIS — R269 Unspecified abnormalities of gait and mobility: Secondary | ICD-10-CM | POA: Diagnosis not present

## 2015-08-14 DIAGNOSIS — R531 Weakness: Secondary | ICD-10-CM

## 2015-08-14 DIAGNOSIS — Z7409 Other reduced mobility: Secondary | ICD-10-CM

## 2015-08-14 NOTE — Patient Instructions (Signed)
ABDUCTION: Standing (Active)    Stand, feet flat. Lift right leg out to side. No weights yet.  Complete _1__ sets of _10__ repetitions. Perform __2_ sessions per day.   EXTENSION: Standing (Active)    Stand, both feet flat. Tap one toe behind you keeping the knee straight.  Complete _1__ sets of _10__ repetitions. Perform _2__ sessions per day.  http://gtsc.exer.us/77   Copyright  VHI. All rights reserved.    http://gtsc.exer.us/111   Copyright  VHI. All rights reserved.

## 2015-08-14 NOTE — Therapy (Addendum)
[facility] [address], [place], [place], [address] Phone: [phone]   Fax:  [phone]  Physical Therapy Treatment  Patient Details  Name: [name] MRN: [id number] Date of Birth: 05-15-1935 Referring Provider: Nani Skillern, NP  Encounter Date: 08/14/2015      PT End of Session - 08/14/15 1530    Visit Number 4   Number of Visits 10   Date for PT Re-Evaluation 10/02/15   PT Start Time 0045   PT Stop Time 1610   PT Time Calculation (min) 40 min   Activity Tolerance Patient tolerated treatment well   Behavior During Therapy [facility] for tasks assessed/performed      Past Medical History  Diagnosis Date  . Unspecified hypothyroidism   . Pure hypercholesterolemia   . Unspecified essential hypertension   . Syncope and collapse   . Type II or unspecified type diabetes mellitus without mention of complication, not stated as uncontrolled   . Depressive disorder, not elsewhere classified   . Extrinsic asthma, unspecified     no problem since adulthood  . Obesity   . Anxiety state, unspecified     panic attacks  . OSA on CPAP     severe  . Allergic rhinitis   . Respiratory failure with hypoxia ([place]) 09/2008    acute, secondary to multiple bilateral pulmonary embolism , negative hypercoagulable workup 09/2008 hospital stay  . Scoliosis     Past Surgical History  Procedure Laterality Date  . Thyroid surgery    . Knee replaced    . Eye surgery    . Joint replacement      There were no vitals filed for this visit.  Visit Diagnosis:  Abnormality of gait  Decreased strength, endurance, and mobility      Subjective Assessment - 08/14/15 1532    Subjective Still feeling good after treatment. Feels her walking is better: steadier, faster, and more endurance.   Currently in Pain? No/denies                         [facility] Adult PT Treatment/Exercise - 08/14/15 0001    Ambulation/Gait    Ambulation Distance (Feet) --  5 min with cane, SBA, Knee gave slightly right at the end   Knee/Hip Exercises: Standing   Hip Abduction Stengthening;Both;10 reps   Hip Extension Stengthening;Both;10 reps   Forward Step Up Left;1 set;5 reps;Hand Hold: 2  at stairs, could not do RT   Rebounder weight shifting 1 min 3 ways   Knee/Hip Exercises: Seated   Long Arc Quad Strengthening;Both;1 set;10 reps   Long Arc Quad Weight 3 lbs.   Ball Squeeze 2 x10    Sit to Sand 10 reps;without UE support  VC on RTLE alignment.                PT Education - 08/14/15 1533    Education provided Yes   Education Details HEP advancement: Standing hip abduction and extension   Person(s) Educated Patient   Methods Explanation;Demonstration;Tactile cues;Verbal cues;Handout   Comprehension Returned demonstration;Verbalized understanding          PT Short Term Goals - 08/14/15 1609    PT SHORT TERM GOAL #2   Title walk for 5-8 minutes without fatigue   Time 4   Period Weeks   Status Partially Met  Right at 5 min but this is challenging  PT Long Term Goals - 08/07/15 1631    PT LONG TERM GOAL #1   Title be independent in advanced HEP   Time 8   Period Weeks   Status New   PT LONG TERM GOAL #2   Title perform  TUG in < or = to 12 seconds to reduce falls risk   Time 8   Period Weeks   Status New   PT LONG TERM GOAL #3   Title walk for 10-15 minutes without fatigue   Time 8   Period Weeks   Status New   PT LONG TERM GOAL #4   Title perform 5x sit to stand in < or = to 12 seconds   Time 8   Period Weeks   Status New   PT LONG TERM GOAL #5   Title improve gait and safety to wean from cane for home distances   Time 8   Period Weeks               Plan - 09/13/2015 1600    Clinical Impression Statement Added to HEP today, standing exercises for hip strength. pt able to go up step with LT leg only. Pt was limited by strength and could not do on the right. She  feels she is walking faster and has more endurance. pt cna walk 5 min wihtout sitting at this time.    Pt will benefit from skilled therapeutic intervention in order to improve on the following deficits Abnormal gait;Decreased strength;Decreased mobility;Decreased balance;Decreased activity tolerance;Decreased endurance   Rehab Potential Good   PT Frequency 2x / week   PT Duration 8 weeks   PT Treatment/Interventions ADLs/Self Care Home Management;Cryotherapy;Moist Heat;Therapeutic exercise;Therapeutic activities;Functional mobility training;Manual techniques;Patient/family education;Neuromuscular re-education;Passive range of motion;Stair training;Gait training   PT Next Visit Plan LE strength, endurance, balance training   Consulted and Agree with Plan of Care Patient        Problem List Patient Active Problem List   Diagnosis Date Noted  . Diabetes type 2, uncontrolled ([place]) 08/05/2010  . GOUT 06/23/2010  . DERMATITIS 01/16/2010  . BACK PAIN 06/13/2009  . MYALGIA 06/13/2009  . ANXIETY 12/13/2008  . LEG EDEMA, CHRONIC 11/08/2008  . SLEEP APNEA 10/26/2008  . HYPOTHYROIDISM 09/27/2008  . HYPERCHOLESTEROLEMIA 09/27/2008  . OBESITY 09/27/2008  . DEPRESSION 09/27/2008  . Essential hypertension 09/27/2008  . ALLERGIC RHINITIS 09/27/2008  . ASTHMA, CHILDHOOD 09/27/2008    Jiro Kiester, PTA 09-13-15, 4:11 PM  G-codes:  Mobility Category CK D/C  status CK Goal Status PHYSICAL THERAPY DISCHARGE SUMMARY  Visits from Start of Care: 4  Current functional level related to goals / functional outcomes: Pt didn't return to PT after her 4th visit.  Current status isn't known.     Remaining deficits: Unknown   Education / Equipment: HEP Plan: Patient agrees to discharge.  Patient goals were not met. Patient is being discharged due to not returning since the last visit.  ?????   Sigurd Sos, PT 11/04/2015 3:06 PM   [facility] [address] [place], [place], [address] Phone: [phone]   Fax:  [phone]  Name: [name] MRN: [id number] Date of Birth: 1935/07/05

## 2015-08-17 DIAGNOSIS — J069 Acute upper respiratory infection, unspecified: Secondary | ICD-10-CM | POA: Diagnosis not present

## 2015-08-19 ENCOUNTER — Encounter: Payer: Medicare Other | Admitting: Physical Therapy

## 2015-08-24 ENCOUNTER — Emergency Department (HOSPITAL_COMMUNITY): Payer: Medicare Other

## 2015-08-24 ENCOUNTER — Encounter (HOSPITAL_COMMUNITY): Payer: Self-pay | Admitting: *Deleted

## 2015-08-24 ENCOUNTER — Inpatient Hospital Stay (HOSPITAL_COMMUNITY)
Admission: EM | Admit: 2015-08-24 | Discharge: 2015-08-29 | DRG: 291 | Disposition: A | Discharge status: Home Health | Payer: Medicare Other | Attending: Family Medicine | Admitting: Family Medicine

## 2015-08-24 DIAGNOSIS — Z79899 Other long term (current) drug therapy: Secondary | ICD-10-CM | POA: Diagnosis not present

## 2015-08-24 DIAGNOSIS — Z794 Long term (current) use of insulin: Secondary | ICD-10-CM | POA: Diagnosis not present

## 2015-08-24 DIAGNOSIS — Z833 Family history of diabetes mellitus: Secondary | ICD-10-CM

## 2015-08-24 DIAGNOSIS — E1165 Type 2 diabetes mellitus with hyperglycemia: Secondary | ICD-10-CM | POA: Diagnosis present

## 2015-08-24 DIAGNOSIS — I5031 Acute diastolic (congestive) heart failure: Secondary | ICD-10-CM | POA: Diagnosis present

## 2015-08-24 DIAGNOSIS — J440 Chronic obstructive pulmonary disease with acute lower respiratory infection: Secondary | ICD-10-CM | POA: Diagnosis present

## 2015-08-24 DIAGNOSIS — R0902 Hypoxemia: Secondary | ICD-10-CM | POA: Diagnosis present

## 2015-08-24 DIAGNOSIS — E7800 Pure hypercholesterolemia, unspecified: Secondary | ICD-10-CM | POA: Diagnosis present

## 2015-08-24 DIAGNOSIS — Z8249 Family history of ischemic heart disease and other diseases of the circulatory system: Secondary | ICD-10-CM | POA: Diagnosis not present

## 2015-08-24 DIAGNOSIS — E1122 Type 2 diabetes mellitus with diabetic chronic kidney disease: Secondary | ICD-10-CM | POA: Diagnosis not present

## 2015-08-24 DIAGNOSIS — Z86711 Personal history of pulmonary embolism: Secondary | ICD-10-CM | POA: Diagnosis not present

## 2015-08-24 DIAGNOSIS — G4730 Sleep apnea, unspecified: Secondary | ICD-10-CM | POA: Diagnosis present

## 2015-08-24 DIAGNOSIS — F419 Anxiety disorder, unspecified: Secondary | ICD-10-CM | POA: Diagnosis present

## 2015-08-24 DIAGNOSIS — J9621 Acute and chronic respiratory failure with hypoxia: Secondary | ICD-10-CM | POA: Diagnosis present

## 2015-08-24 DIAGNOSIS — R0600 Dyspnea, unspecified: Secondary | ICD-10-CM | POA: Diagnosis not present

## 2015-08-24 DIAGNOSIS — F411 Generalized anxiety disorder: Secondary | ICD-10-CM | POA: Diagnosis present

## 2015-08-24 DIAGNOSIS — I130 Hypertensive heart and chronic kidney disease with heart failure and stage 1 through stage 4 chronic kidney disease, or unspecified chronic kidney disease: Principal | ICD-10-CM | POA: Diagnosis present

## 2015-08-24 DIAGNOSIS — Z825 Family history of asthma and other chronic lower respiratory diseases: Secondary | ICD-10-CM | POA: Diagnosis not present

## 2015-08-24 DIAGNOSIS — Z87442 Personal history of urinary calculi: Secondary | ICD-10-CM

## 2015-08-24 DIAGNOSIS — J9601 Acute respiratory failure with hypoxia: Secondary | ICD-10-CM | POA: Diagnosis not present

## 2015-08-24 DIAGNOSIS — G4733 Obstructive sleep apnea (adult) (pediatric): Secondary | ICD-10-CM | POA: Diagnosis present

## 2015-08-24 DIAGNOSIS — N184 Chronic kidney disease, stage 4 (severe): Secondary | ICD-10-CM

## 2015-08-24 DIAGNOSIS — R0602 Shortness of breath: Secondary | ICD-10-CM | POA: Diagnosis not present

## 2015-08-24 DIAGNOSIS — J189 Pneumonia, unspecified organism: Secondary | ICD-10-CM | POA: Diagnosis present

## 2015-08-24 DIAGNOSIS — J9690 Respiratory failure, unspecified, unspecified whether with hypoxia or hypercapnia: Secondary | ICD-10-CM | POA: Diagnosis present

## 2015-08-24 DIAGNOSIS — I10 Essential (primary) hypertension: Secondary | ICD-10-CM | POA: Diagnosis not present

## 2015-08-24 DIAGNOSIS — I2699 Other pulmonary embolism without acute cor pulmonale: Secondary | ICD-10-CM | POA: Diagnosis not present

## 2015-08-24 DIAGNOSIS — J40 Bronchitis, not specified as acute or chronic: Secondary | ICD-10-CM | POA: Diagnosis present

## 2015-08-24 DIAGNOSIS — N183 Chronic kidney disease, stage 3 (moderate): Secondary | ICD-10-CM | POA: Diagnosis present

## 2015-08-24 DIAGNOSIS — T380X5A Adverse effect of glucocorticoids and synthetic analogues, initial encounter: Secondary | ICD-10-CM | POA: Diagnosis present

## 2015-08-24 DIAGNOSIS — Z6841 Body Mass Index (BMI) 40.0 and over, adult: Secondary | ICD-10-CM | POA: Diagnosis not present

## 2015-08-24 DIAGNOSIS — N179 Acute kidney failure, unspecified: Secondary | ICD-10-CM

## 2015-08-24 DIAGNOSIS — E039 Hypothyroidism, unspecified: Secondary | ICD-10-CM | POA: Diagnosis present

## 2015-08-24 DIAGNOSIS — E119 Type 2 diabetes mellitus without complications: Secondary | ICD-10-CM | POA: Diagnosis present

## 2015-08-24 DIAGNOSIS — Z87891 Personal history of nicotine dependence: Secondary | ICD-10-CM | POA: Diagnosis not present

## 2015-08-24 DIAGNOSIS — R05 Cough: Secondary | ICD-10-CM | POA: Diagnosis not present

## 2015-08-24 HISTORY — DX: Respiratory failure, unspecified, unspecified whether with hypoxia or hypercapnia: J96.90

## 2015-08-24 LAB — I-STAT CHEM 8, ED
BUN: 35 mg/dL — ABNORMAL HIGH (ref 6–20)
Calcium, Ion: 1.14 mmol/L (ref 1.13–1.30)
Chloride: 101 mmol/L (ref 101–111)
Creatinine, Ser: 1.7 mg/dL — ABNORMAL HIGH (ref 0.44–1.00)
Glucose, Bld: 231 mg/dL — ABNORMAL HIGH (ref 65–99)
HCT: 33 % — ABNORMAL LOW (ref 36.0–46.0)
Hemoglobin: 11.2 g/dL — ABNORMAL LOW (ref 12.0–15.0)
Potassium: 3.9 mmol/L (ref 3.5–5.1)
Sodium: 139 mmol/L (ref 135–145)
TCO2: 28 mmol/L (ref 0–100)

## 2015-08-24 LAB — CBC
HCT: 32.3 % — ABNORMAL LOW (ref 36.0–46.0)
Hemoglobin: 10.3 g/dL — ABNORMAL LOW (ref 12.0–15.0)
MCH: 29.9 pg (ref 26.0–34.0)
MCHC: 31.9 g/dL (ref 30.0–36.0)
MCV: 93.9 fL (ref 78.0–100.0)
Platelets: 116 10*3/uL — ABNORMAL LOW (ref 150–400)
RBC: 3.44 MIL/uL — ABNORMAL LOW (ref 3.87–5.11)
RDW: 17.6 % — ABNORMAL HIGH (ref 11.5–15.5)
WBC: 5.8 10*3/uL (ref 4.0–10.5)

## 2015-08-24 LAB — CBC WITH DIFFERENTIAL/PLATELET
Basophils Absolute: 0 10*3/uL (ref 0.0–0.1)
Basophils Relative: 0 %
Eosinophils Absolute: 0.2 10*3/uL (ref 0.0–0.7)
Eosinophils Relative: 4 %
HCT: 33.4 % — ABNORMAL LOW (ref 36.0–46.0)
Hemoglobin: 10.6 g/dL — ABNORMAL LOW (ref 12.0–15.0)
Lymphocytes Relative: 19 %
Lymphs Abs: 1 10*3/uL (ref 0.7–4.0)
MCH: 29.2 pg (ref 26.0–34.0)
MCHC: 31.7 g/dL (ref 30.0–36.0)
MCV: 92 fL (ref 78.0–100.0)
Monocytes Absolute: 0.3 10*3/uL (ref 0.1–1.0)
Monocytes Relative: 5 %
Neutro Abs: 3.9 10*3/uL (ref 1.7–7.7)
Neutrophils Relative %: 72 %
Platelets: 105 10*3/uL — ABNORMAL LOW (ref 150–400)
RBC: 3.63 MIL/uL — ABNORMAL LOW (ref 3.87–5.11)
RDW: 17.5 % — ABNORMAL HIGH (ref 11.5–15.5)
WBC: 5.3 10*3/uL (ref 4.0–10.5)

## 2015-08-24 LAB — HEPATIC FUNCTION PANEL
ALT: 18 U/L (ref 14–54)
AST: 28 U/L (ref 15–41)
Albumin: 4 g/dL (ref 3.5–5.0)
Alkaline Phosphatase: 39 U/L (ref 38–126)
Bilirubin, Direct: <0.1 mg/dL — ABNORMAL LOW (ref 0.1–0.5)
Total Bilirubin: 0.6 mg/dL (ref 0.3–1.2)
Total Protein: 7.9 g/dL (ref 6.5–8.1)

## 2015-08-24 LAB — I-STAT TROPONIN, ED: Troponin i, poc: 0.02 ng/mL (ref 0.00–0.08)

## 2015-08-24 LAB — BRAIN NATRIURETIC PEPTIDE: B Natriuretic Peptide: 60.9 pg/mL (ref 0.0–100.0)

## 2015-08-24 LAB — PROCALCITONIN: Procalcitonin: <0.1 ng/mL

## 2015-08-24 LAB — I-STAT CG4 LACTIC ACID, ED
Lactic Acid, Venous: 1.58 mmol/L (ref 0.5–2.0)
Lactic Acid, Venous: 1.42 mmol/L (ref 0.5–2.0)

## 2015-08-24 LAB — GLUCOSE, CAPILLARY: Glucose-Capillary: 435 mg/dL — ABNORMAL HIGH (ref 65–99)

## 2015-08-24 MED ORDER — INSULIN ASPART PROT & ASPART (70-30 MIX) 100 UNIT/ML ~~LOC~~ SUSP
60.0000 [IU] | Freq: Two times a day (BID) | SUBCUTANEOUS | Status: DC
  Administered 2015-08-25: 60 [IU] via SUBCUTANEOUS
  Filled 2015-08-24: qty 10

## 2015-08-24 MED ORDER — DEXTROSE 5 % IV SOLN
1.0000 g | INTRAVENOUS | Status: DC
  Administered 2015-08-25 – 2015-08-26 (×2): 1 g via INTRAVENOUS
  Filled 2015-08-24 (×4): qty 10

## 2015-08-24 MED ORDER — ATORVASTATIN CALCIUM 10 MG PO TABS
20.0000 mg | ORAL_TABLET | Freq: Every day | ORAL | Status: DC
  Administered 2015-08-24 – 2015-08-29 (×6): 20 mg via ORAL
  Filled 2015-08-24 (×7): qty 2

## 2015-08-24 MED ORDER — IPRATROPIUM-ALBUTEROL 0.5-2.5 (3) MG/3ML IN SOLN
3.0000 mL | Freq: Once | RESPIRATORY_TRACT | Status: AC
  Administered 2015-08-24: 3 mL via RESPIRATORY_TRACT
  Filled 2015-08-24: qty 3

## 2015-08-24 MED ORDER — ALBUTEROL SULFATE (2.5 MG/3ML) 0.083% IN NEBU
2.5000 mg | INHALATION_SOLUTION | RESPIRATORY_TRACT | Status: DC | PRN
  Administered 2015-08-24: 2.5 mg via RESPIRATORY_TRACT
  Filled 2015-08-24: qty 3

## 2015-08-24 MED ORDER — METHYLPREDNISOLONE SODIUM SUCC 125 MG IJ SOLR
60.0000 mg | Freq: Two times a day (BID) | INTRAMUSCULAR | Status: DC
  Administered 2015-08-25: 60 mg via INTRAVENOUS
  Filled 2015-08-24: qty 2

## 2015-08-24 MED ORDER — ALBUTEROL SULFATE (2.5 MG/3ML) 0.083% IN NEBU
2.5000 mg | INHALATION_SOLUTION | Freq: Once | RESPIRATORY_TRACT | Status: AC
  Administered 2015-08-24: 2.5 mg via RESPIRATORY_TRACT
  Filled 2015-08-24: qty 3

## 2015-08-24 MED ORDER — SERTRALINE HCL 50 MG PO TABS
50.0000 mg | ORAL_TABLET | Freq: Every day | ORAL | Status: DC
  Administered 2015-08-24 – 2015-08-29 (×6): 50 mg via ORAL
  Filled 2015-08-24 (×6): qty 1

## 2015-08-24 MED ORDER — OXYBUTYNIN CHLORIDE 5 MG PO TABS
5.0000 mg | ORAL_TABLET | Freq: Every day | ORAL | Status: DC
  Administered 2015-08-24 – 2015-08-29 (×6): 5 mg via ORAL
  Filled 2015-08-24 (×6): qty 1

## 2015-08-24 MED ORDER — ENOXAPARIN SODIUM 30 MG/0.3ML ~~LOC~~ SOLN
30.0000 mg | SUBCUTANEOUS | Status: DC
  Administered 2015-08-24 – 2015-08-28 (×5): 30 mg via SUBCUTANEOUS
  Filled 2015-08-24 (×5): qty 0.3

## 2015-08-24 MED ORDER — TIZANIDINE HCL 4 MG PO TABS
4.0000 mg | ORAL_TABLET | Freq: Four times a day (QID) | ORAL | Status: DC | PRN
  Filled 2015-08-24: qty 1

## 2015-08-24 MED ORDER — IOHEXOL 350 MG/ML SOLN
100.0000 mL | Freq: Once | INTRAVENOUS | Status: AC | PRN
  Administered 2015-08-24: 80 mL via INTRAVENOUS

## 2015-08-24 MED ORDER — INSULIN ASPART 100 UNIT/ML ~~LOC~~ SOLN
0.0000 [IU] | Freq: Every day | SUBCUTANEOUS | Status: DC
  Administered 2015-08-24 – 2015-08-26 (×3): 5 [IU] via SUBCUTANEOUS
  Administered 2015-08-27: 3 [IU] via SUBCUTANEOUS
  Administered 2015-08-28: 2 [IU] via SUBCUTANEOUS

## 2015-08-24 MED ORDER — METHYLPREDNISOLONE SODIUM SUCC 125 MG IJ SOLR
125.0000 mg | Freq: Once | INTRAMUSCULAR | Status: AC
  Administered 2015-08-24: 125 mg via INTRAVENOUS
  Filled 2015-08-24: qty 2

## 2015-08-24 MED ORDER — DEXTROSE 5 % IV SOLN
500.0000 mg | INTRAVENOUS | Status: DC
  Administered 2015-08-25 – 2015-08-26 (×2): 500 mg via INTRAVENOUS
  Filled 2015-08-24 (×3): qty 500

## 2015-08-24 MED ORDER — HYDROCORTISONE NA SUCCINATE PF 100 MG IJ SOLR
200.0000 mg | Freq: Once | INTRAMUSCULAR | Status: AC
  Administered 2015-08-24: 200 mg via INTRAVENOUS
  Filled 2015-08-24: qty 4

## 2015-08-24 MED ORDER — DIPHENHYDRAMINE HCL 50 MG/ML IJ SOLN
50.0000 mg | Freq: Once | INTRAMUSCULAR | Status: AC
  Administered 2015-08-24: 50 mg via INTRAVENOUS
  Filled 2015-08-24: qty 1

## 2015-08-24 MED ORDER — TAMSULOSIN HCL 0.4 MG PO CAPS
0.4000 mg | ORAL_CAPSULE | Freq: Every day | ORAL | Status: DC
  Administered 2015-08-24 – 2015-08-29 (×6): 0.4 mg via ORAL
  Filled 2015-08-24 (×6): qty 1

## 2015-08-24 MED ORDER — LEVOTHYROXINE SODIUM 112 MCG PO TABS
112.0000 ug | ORAL_TABLET | Freq: Every day | ORAL | Status: DC
  Administered 2015-08-25 – 2015-08-29 (×5): 112 ug via ORAL
  Filled 2015-08-24 (×5): qty 1

## 2015-08-24 MED ORDER — FENOFIBRATE 54 MG PO TABS
54.0000 mg | ORAL_TABLET | Freq: Every day | ORAL | Status: DC
  Administered 2015-08-24 – 2015-08-29 (×6): 54 mg via ORAL
  Filled 2015-08-24 (×6): qty 1

## 2015-08-24 MED ORDER — DEXTROMETHORPHAN POLISTIREX ER 30 MG/5ML PO SUER
30.0000 mg | Freq: Two times a day (BID) | ORAL | Status: DC | PRN
  Administered 2015-08-25 – 2015-08-27 (×3): 30 mg via ORAL
  Filled 2015-08-24 (×4): qty 5

## 2015-08-24 MED ORDER — OXYCODONE HCL 5 MG PO TABS: 5.0000 mg | ORAL_TABLET | ORAL | Status: DC | PRN

## 2015-08-24 MED ORDER — INSULIN ASPART 100 UNIT/ML ~~LOC~~ SOLN
0.0000 [IU] | Freq: Three times a day (TID) | SUBCUTANEOUS | Status: DC
  Administered 2015-08-25 (×3): 15 [IU] via SUBCUTANEOUS
  Administered 2015-08-26: 8 [IU] via SUBCUTANEOUS
  Administered 2015-08-26: 11 [IU] via SUBCUTANEOUS
  Administered 2015-08-26: 8 [IU] via SUBCUTANEOUS
  Administered 2015-08-27: 5 [IU] via SUBCUTANEOUS
  Administered 2015-08-27: 15 [IU] via SUBCUTANEOUS
  Administered 2015-08-27: 11 [IU] via SUBCUTANEOUS
  Administered 2015-08-28: 5 [IU] via SUBCUTANEOUS

## 2015-08-24 MED ORDER — IPRATROPIUM BROMIDE 0.02 % IN SOLN: 0.5000 mg | Freq: Four times a day (QID) | RESPIRATORY_TRACT | Status: DC

## 2015-08-24 MED ORDER — LEVOFLOXACIN IN D5W 500 MG/100ML IV SOLN
500.0000 mg | Freq: Once | INTRAVENOUS | Status: AC
  Administered 2015-08-24: 500 mg via INTRAVENOUS
  Filled 2015-08-24: qty 100

## 2015-08-24 MED ORDER — FAMOTIDINE 20 MG PO TABS
10.0000 mg | ORAL_TABLET | Freq: Every day | ORAL | Status: DC
  Administered 2015-08-24 – 2015-08-29 (×6): 10 mg via ORAL
  Filled 2015-08-24 (×6): qty 1

## 2015-08-24 MED ORDER — FUROSEMIDE 10 MG/ML IJ SOLN
40.0000 mg | Freq: Once | INTRAMUSCULAR | Status: AC
  Administered 2015-08-24: 40 mg via INTRAVENOUS
  Filled 2015-08-24: qty 4

## 2015-08-24 MED ORDER — DM-GUAIFENESIN ER 30-600 MG PO TB12
1.0000 | ORAL_TABLET | Freq: Two times a day (BID) | ORAL | Status: DC | PRN
  Administered 2015-08-26 – 2015-08-29 (×4): 1 via ORAL
  Filled 2015-08-24 (×5): qty 1

## 2015-08-24 NOTE — ED Notes (Signed)
Schlossman at bedside. 

## 2015-08-24 NOTE — ED Notes (Signed)
Pt drowsy at present time related to benadryl administration to prevent reaction to contrast; pt resting; alert and oriented x4; respiratory assessment unchanged.

## 2015-08-24 NOTE — ED Provider Notes (Signed)
CSN: [id number]     Arrival date & time 08/24/15  1249 History   First MD Initiated Contact with Patient 08/24/15 1252     Chief Complaint  Patient presents with  . Shortness of Breath     (Consider location/radiation/quality/duration/timing/severity/associated sxs/prior Treatment) Patient is a 80 y.o. female presenting with shortness of breath. The history is provided by the patient (Patient complains of cough shortness of breath mild wheezing. Patient states that she's been on antibiotics per day not getting better. She states that she had bilateral PE of years ago and she felt similar to this).  Shortness of Breath Severity:  Moderate Onset quality:  Sudden Timing:  Constant Progression:  Waxing and waning Chronicity:  New Context: not activity   Relieved by:  Nothing Associated symptoms: wheezing   Associated symptoms: no abdominal pain, no chest pain, no cough, no headaches and no rash     Past Medical History  Diagnosis Date  . Unspecified hypothyroidism   . Pure hypercholesterolemia   . Unspecified essential hypertension   . Syncope and collapse   . Type II or unspecified type diabetes mellitus without mention of complication, not stated as uncontrolled   . Depressive disorder, not elsewhere classified   . Extrinsic asthma, unspecified     no problem since adulthood  . Obesity   . Anxiety state, unspecified     panic attacks  . OSA on CPAP     severe  . Allergic rhinitis   . Respiratory failure with hypoxia ([place]) 09/2008    acute, secondary to multiple bilateral pulmonary embolism , negative hypercoagulable workup 09/2008 hospital stay  . Scoliosis    Past Surgical History  Procedure Laterality Date  . Thyroid surgery    . Knee replaced    . Eye surgery    . Joint replacement     Family History  Problem Relation Age of Onset  . Arthritis Other   . Diabetes Other   . Hyperlipidemia Other   . Hypertension Other   . Coronary artery disease Other   . Stroke  Other   . Allergies Mother   . Clotting disorder Mother   . Osteoarthritis Mother   . Asthma Mother   . Osteoarthritis Daughter   . Rheum arthritis Maternal Grandmother   . Clotting disorder Maternal Grandmother   . Clotting disorder Maternal Uncle   . Clotting disorder Daughter   . Allergies Daughter    Social History  Substance Use Topics  . Smoking status: Former Smoker -- 20 years    Quit date: 09/07/1989  . Smokeless tobacco: None  . Alcohol Use: No   OB History    No data available     Review of Systems  Constitutional: Negative for appetite change and fatigue.  HENT: Negative for congestion, ear discharge and sinus pressure.   Eyes: Negative for discharge.  Respiratory: Positive for shortness of breath and wheezing. Negative for cough.   Cardiovascular: Negative for chest pain.  Gastrointestinal: Negative for abdominal pain and diarrhea.  Genitourinary: Negative for frequency and hematuria.  Musculoskeletal: Negative for back pain.  Skin: Negative for rash.  Neurological: Negative for seizures and headaches.  Psychiatric/Behavioral: Negative for hallucinations.      Allergies  Aspirin; Contrast media; Flagyl; Fluticasone-salmeterol; Lactose intolerance (gi); Latex; Lisinopril; Metformin; Sulfonamide derivatives; and Tetracycline  Home Medications   Prior to Admission medications   Medication Sig Start Date End Date Taking? Authorizing Provider  atorvastatin (LIPITOR) 20 MG tablet Take 1 tablet (20  mg total) by mouth daily. 06/12/15  Yes Dorothyann Peng, NP  Coenzyme Q10 200 MG capsule Take 200 mg by mouth daily.     Yes Historical Provider, MD  dextromethorphan (DELSYM) 30 MG/5ML liquid Take 30 mg by mouth 2 (two) times daily as needed for cough.   Yes Historical Provider, MD  dextromethorphan-guaiFENesin (MUCINEX DM) 30-600 MG 12hr tablet Take 1 tablet by mouth 2 (two) times daily as needed for cough.   Yes Historical Provider, MD  fenofibrate 54 MG tablet Take  1 tablet (54 mg total) by mouth daily. 06/12/15  Yes Dorothyann Peng, NP  fexofenadine (ALLEGRA) 180 MG tablet Take 180 mg by mouth daily.     Yes Historical Provider, MD  HUMULIN 70/30 (70-30) 100 UNIT/ML injection INJECT 65 UNITS UNDER THE SKIN AS DIRECTED EVERY MORNING AND 30 UNITS EVERY EVENING Patient taking differently: INJECT 70 UNITS UNDER THE SKIN AS DIRECTED EVERY MORNING AND 60 UNITS EVERY EVENING 07/01/15  Yes Dorothyann Peng, NP  levofloxacin (LEVAQUIN) 500 MG tablet Take 500 mg by mouth daily. 08/17/15  Yes Historical Provider, MD  levothyroxine (SYNTHROID, LEVOTHROID) 112 MCG tablet TK 1 T PO D 03/21/15  Yes Historical Provider, MD  losartan (COZAAR) 25 MG tablet TAKE 1 TABLET BY MOUTH EVERY DAY 04/23/15  Yes Dorothyann Peng, NP  Multiple Vitamin (MULTIVITAMIN) tablet Take 1 tablet by mouth daily.     Yes Historical Provider, MD  oxybutynin (DITROPAN) 5 MG tablet Take 5 mg by mouth daily.    Yes Historical Provider, MD  oxyCODONE (OXY IR/ROXICODONE) 5 MG immediate release tablet Take 5 mg by mouth every 4 (four) hours as needed for severe pain.   Yes Historical Provider, MD  potassium citrate (UROCIT-K) 10 MEQ (1080 MG) SR tablet 10 meq by mouth twice daily 12/26/14  Yes Historical Provider, MD  Probiotic Product (PROBIOTIC PO) Take 1 capsule by mouth daily.   Yes Historical Provider, MD  ranitidine (ZANTAC) 150 MG capsule Take 150 mg by mouth 2 (two) times daily as needed for heartburn.    Yes Historical Provider, MD  sertraline (ZOLOFT) 50 MG tablet Take 50 mg by mouth daily.   Yes Historical Provider, MD  tamsulosin (FLOMAX) 0.4 MG CAPS capsule Take 0.4 mg by mouth daily. 01/22/15  Yes Historical Provider, MD  tiZANidine (ZANAFLEX) 4 MG tablet Take 1 tablet (4 mg total) by mouth every 6 (six) hours as needed for muscle spasms. 05/08/15  Yes Dorothyann Peng, NP  Insulin Pen Needle (B-D UF III MINI PEN NEEDLES) 31G X 5 MM MISC Use daily for insulin injection 01/05/11   Doe-Hyun R Yoo, DO   BP  154/66 mmHg  Pulse 68  Temp(Src) 98.5 F (36.9 C) (Oral)  Resp 20  SpO2 93% Physical Exam  Constitutional: She is oriented to person, place, and time. She appears well-developed.  HENT:  Head: Normocephalic.  Eyes: Conjunctivae and EOM are normal. No scleral icterus.  Neck: Neck supple. No thyromegaly present.  Cardiovascular: Normal rate and regular rhythm.  Exam reveals no gallop and no friction rub.   No murmur heard. Pulmonary/Chest: No stridor. She has wheezes. She has no rales. She exhibits no tenderness.  Abdominal: She exhibits no distension. There is no tenderness. There is no rebound.  Musculoskeletal: Normal range of motion. She exhibits no edema.  Lymphadenopathy:    She has no cervical adenopathy.  Neurological: She is oriented to person, place, and time. She exhibits normal muscle tone. Coordination normal.  Skin: No rash noted.  No erythema.  Psychiatric: She has a normal mood and affect. Her behavior is normal.    ED Course  Procedures (including critical care time) Labs Review Labs Reviewed  CBC WITH DIFFERENTIAL/PLATELET - Abnormal; Notable for the following:    RBC 3.63 (*)    Hemoglobin 10.6 (*)    HCT 33.4 (*)    RDW 17.5 (*)    Platelets 105 (*)    All other components within normal limits  HEPATIC FUNCTION PANEL - Abnormal; Notable for the following:    Bilirubin, Direct <0.1 (*)    All other components within normal limits  I-STAT CHEM 8, ED - Abnormal; Notable for the following:    BUN 35 (*)    Creatinine, Ser 1.70 (*)    Glucose, Bld 231 (*)    Hemoglobin 11.2 (*)    HCT 33.0 (*)    All other components within normal limits  BRAIN NATRIURETIC PEPTIDE  I-STAT TROPOININ, ED  I-STAT CG4 LACTIC ACID, ED  I-STAT CG4 LACTIC ACID, ED    Imaging Review Dg Chest Port 1 View  08/24/2015  CLINICAL DATA:  Patient with shortness of breath. Possible pulmonary embolism. EXAM: PORTABLE CHEST 1 VIEW COMPARISON:  Chest radiograph 09/22/2010. FINDINGS:  Patient is rotated. Monitoring leads overlie the patient. Stable cardiomegaly. Pulmonary vascular redistribution and bilateral interstitial pulmonary opacities. No aggressive or acute appearing osseous lesions. IMPRESSION: Cardiomegaly. Pulmonary redistribution and interstitial opacities may represent pulmonary edema. Atypical infectious process is not excluded. Electronically Signed   By: Lovey Newcomer M.D.   On: 08/24/2015 13:57   I have personally reviewed and evaluated these images and lab results as part of my medical decision-making.   EKG Interpretation   Date/Time:  Saturday August 24 2015 13:05:33 EST Ventricular Rate:  70 PR Interval:  147 QRS Duration: 102 QT Interval:  432 QTC Calculation: 466 R Axis:   -19 Text Interpretation:  Sinus rhythm Borderline left axis deviation Low  voltage, precordial leads Abnormal R-wave progression, late transition  Baseline wander in lead(s) II Confirmed by Mylia Pondexter  MD, [place] [id number]) on  08/24/2015 2:53:12 PM      MDM   Final diagnoses:  None    Chest x-ray shows possible pneumonia. Patient will get CT scan to rule out PE she will be admitted for hypoxia    Milton Ferguson, MD 08/24/15 [id number]

## 2015-08-24 NOTE — ED Notes (Signed)
Pt reports feeling sick flo about a week, was told she got a bacterial infection from unclean CPAP, she sts tonight she woke up feeling very short of breath, went to urgent care and was sent here for possible PE

## 2015-08-24 NOTE — ED Notes (Signed)
DG at bedside; will administer medications with DG completion.

## 2015-08-24 NOTE — H&P (Signed)
PCP:  Dorothyann Peng, NP  The chronology Dr. Soyla Murphy  Referring provider  [place]   Chief Complaint:  Cough, shortness of breath  HPI: [name] is a 80 y.o. female   has a past medical history of Unspecified hypothyroidism; Pure hypercholesterolemia; Unspecified essential hypertension; Syncope and collapse; Type II or unspecified type diabetes mellitus without mention of complication, not stated as uncontrolled; Depressive disorder, not elsewhere classified; Extrinsic asthma, unspecified; Obesity; Anxiety state, unspecified; OSA on CPAP; Allergic rhinitis; Respiratory failure with hypoxia ([place]) (09/2008); and Scoliosis.   Presented with URI type of symptoms for past 1 week, patient apparently has been seen for this in the past and was told that she may have an infection from contaminated C Pap machine. He has been treated for this with Levaquin to 1 week but has not been getting better. She continued to have cough. Today this morning she became very short of breath, wheezing and was concerned called her family and presented to urgent care from where was sent to emergency department for evaluation of possible PE given prior history prior history. In emergency department CT angiogram showed no pulmonary embolism there is some groundglass and nodular opacity. Bronchovascular distribution could be possible chronic bronchitis no lobar pneumonia.  Patient required up to 3 L of oxygen. Hypoxic on room air down to 84% His x-ray was worrisome for cardiomegaly could not ruled out some pulmonary edema congestion Patient has no history of CHF denies any lower extremity swelling but does have abdominal swelling. Patient is been sleeping up sitting in a chair for this 3 years but describes anxiety  when laying flat   Hospitalist was called for admission for bronchitis versus reactive airway disease with acute respiratory failure with hypoxia   Review of Systems:    Pertinent positives include:  shortness of breath at rest, dyspnea on exertion,  non-productive cough,  Wheezing, excess mucus productive cough Constitutional:  No weight loss, night sweats, Fevers, chills, fatigue, weight loss  HEENT:  No headaches, Difficulty swallowing,Tooth/dental problems,Sore throat,  No sneezing, itching, ear ache, nasal congestion, post nasal drip,  Cardio-vascular:  No chest pain, Orthopnea, PND, anasarca, dizziness, palpitations.no Bilateral lower extremity swelling  GI:  No heartburn, indigestion, abdominal pain, nausea, vomiting, diarrhea, change in bowel habits, loss of appetite, melena, blood in stool, hematemesis Resp:  No coughing up of blood.No change in color of mucus.No  Skin:  no rash or lesions. No jaundice GU:  no dysuria, change in color of urine, no urgency or frequency. No straining to urinate.  No flank pain.  Musculoskeletal:  No joint pain or no joint swelling. No decreased range of motion. No back pain.  Psych:  No change in mood or affect. No depression or anxiety. No memory loss.  Neuro: no localizing neurological complaints, no tingling, no weakness, no double vision, no gait abnormality, no slurred speech, no confusion  Otherwise ROS are negative except for above, 10 systems were reviewed  Past Medical History: Past Medical History  Diagnosis Date  . Unspecified hypothyroidism   . Pure hypercholesterolemia   . Unspecified essential hypertension   . Syncope and collapse   . Type II or unspecified type diabetes mellitus without mention of complication, not stated as uncontrolled   . Depressive disorder, not elsewhere classified   . Extrinsic asthma, unspecified     no problem since adulthood  . Obesity   . Anxiety state, unspecified     panic attacks  . OSA on CPAP  severe  . Allergic rhinitis   . Respiratory failure with hypoxia ([place]) 09/2008    acute, secondary to multiple bilateral pulmonary embolism , negative hypercoagulable workup 09/2008 hospital  stay  . Scoliosis    Past Surgical History  Procedure Laterality Date  . Thyroid surgery    . Knee replaced    . Eye surgery    . Joint replacement       Medications: Prior to Admission medications   Medication Sig Start Date End Date Taking? Authorizing Provider  atorvastatin (LIPITOR) 20 MG tablet Take 1 tablet (20 mg total) by mouth daily. 06/12/15  Yes Dorothyann Peng, NP  Coenzyme Q10 200 MG capsule Take 200 mg by mouth daily.     Yes Historical Provider, MD  dextromethorphan (DELSYM) 30 MG/5ML liquid Take 30 mg by mouth 2 (two) times daily as needed for cough.   Yes Historical Provider, MD  dextromethorphan-guaiFENesin (MUCINEX DM) 30-600 MG 12hr tablet Take 1 tablet by mouth 2 (two) times daily as needed for cough.   Yes Historical Provider, MD  fenofibrate 54 MG tablet Take 1 tablet (54 mg total) by mouth daily. 06/12/15  Yes Dorothyann Peng, NP  fexofenadine (ALLEGRA) 180 MG tablet Take 180 mg by mouth daily.     Yes Historical Provider, MD  HUMULIN 70/30 (70-30) 100 UNIT/ML injection INJECT 65 UNITS UNDER THE SKIN AS DIRECTED EVERY MORNING AND 30 UNITS EVERY EVENING Patient taking differently: INJECT 70 UNITS UNDER THE SKIN AS DIRECTED EVERY MORNING AND 60 UNITS EVERY EVENING 07/01/15  Yes Dorothyann Peng, NP  levofloxacin (LEVAQUIN) 500 MG tablet Take 500 mg by mouth daily. 08/17/15  Yes Historical Provider, MD  levothyroxine (SYNTHROID, LEVOTHROID) 112 MCG tablet TK 1 T PO D 03/21/15  Yes Historical Provider, MD  losartan (COZAAR) 25 MG tablet TAKE 1 TABLET BY MOUTH EVERY DAY 04/23/15  Yes Dorothyann Peng, NP  Multiple Vitamin (MULTIVITAMIN) tablet Take 1 tablet by mouth daily.     Yes Historical Provider, MD  oxybutynin (DITROPAN) 5 MG tablet Take 5 mg by mouth daily.    Yes Historical Provider, MD  oxyCODONE (OXY IR/ROXICODONE) 5 MG immediate release tablet Take 5 mg by mouth every 4 (four) hours as needed for severe pain.   Yes Historical Provider, MD  potassium citrate (UROCIT-K) 10  MEQ (1080 MG) SR tablet 10 meq by mouth twice daily 12/26/14  Yes Historical Provider, MD  Probiotic Product (PROBIOTIC PO) Take 1 capsule by mouth daily.   Yes Historical Provider, MD  ranitidine (ZANTAC) 150 MG capsule Take 150 mg by mouth 2 (two) times daily as needed for heartburn.    Yes Historical Provider, MD  sertraline (ZOLOFT) 50 MG tablet Take 50 mg by mouth daily.   Yes Historical Provider, MD  tamsulosin (FLOMAX) 0.4 MG CAPS capsule Take 0.4 mg by mouth daily. 01/22/15  Yes Historical Provider, MD  tiZANidine (ZANAFLEX) 4 MG tablet Take 1 tablet (4 mg total) by mouth every 6 (six) hours as needed for muscle spasms. 05/08/15  Yes Dorothyann Peng, NP  Insulin Pen Needle (B-D UF III MINI PEN NEEDLES) 31G X 5 MM MISC Use daily for insulin injection 01/05/11   Doe-Hyun Kyra Searles, DO    Allergies:   Allergies  Allergen Reactions  . Aspirin   . Contrast Media [Iodinated Diagnostic Agents]   . Flagyl [Metronidazole]   . Fluticasone-Salmeterol   . Lactose Intolerance (Gi)   . Latex   . Lisinopril     REACTION: cough  .  Metformin     REACTION: nausea  . Sulfonamide Derivatives   . Tetracycline     Social History:  Ambulatory  walker or cane Lives at home alone,     reports that she quit smoking about 25 years ago. She does not have any smokeless tobacco history on file. She reports that she does not drink alcohol or use illicit drugs.    Family History: family history includes Allergies in her daughter and mother; Arthritis in her other; Asthma in her mother; Clotting disorder in her daughter, maternal grandmother, maternal uncle, and mother; Coronary artery disease in her other; Diabetes in her other; Hyperlipidemia in her other; Hypertension in her other; Osteoarthritis in her daughter and mother; Rheum arthritis in her maternal grandmother; Stroke in her other.    Physical Exam: Patient Vitals for the past 24 hrs:  BP Temp Temp src Pulse Resp SpO2  08/24/15 1900 175/69 mmHg - -  66 20 96 %  08/24/15 1855 - - - - - 96 %  08/24/15 1845 (!) 148/48 mmHg - - 63 22 93 %  08/24/15 1830 183/61 mmHg - - 60 20 94 %  08/24/15 1815 180/74 mmHg - - 60 20 93 %  08/24/15 1803 196/74 mmHg - - 63 19 94 %  08/24/15 1745 166/66 mmHg - - 68 21 96 %  08/24/15 1731 - 97.8 F (36.6 C) Oral - - -  08/24/15 1730 188/58 mmHg - - 61 22 93 %  08/24/15 1645 - - - (!) 56 21 95 %  08/24/15 1615 148/96 mmHg - - 64 21 93 %  08/24/15 1601 172/59 mmHg 98.9 F (37.2 C) Oral (!) 56 20 94 %  08/24/15 1600 172/59 mmHg - - (!) 55 19 93 %  08/24/15 1545 174/64 mmHg - - - 23 -  08/24/15 1500 (!) 169/51 mmHg - - 64 19 93 %  08/24/15 1445 167/56 mmHg - - 65 20 94 %  08/24/15 1430 154/66 mmHg - - 68 20 93 %  08/24/15 1415 (!) 128/41 mmHg - - 70 19 96 %  08/24/15 1400 (!) 138/41 mmHg - - 67 21 95 %  08/24/15 1345 (!) 128/49 mmHg - - 64 12 100 %  08/24/15 1337 - - - - - 98 %  08/24/15 1308 - - - - - 96 %  08/24/15 1307 172/100 mmHg 98.5 F (36.9 C) Oral 68 17 (!) 84 %    1. General: Actively wheezing increased work of breathing 2. Psychological: Alert and Oriented 3. Head/ENT:   Moist  Mucous Membranes                          Head Non traumatic, neck supple                          Normal  Dentition 4. SKIN: normal   Skin turgor,  Skin clean Dry and intact no rash 5. Heart: Regular rate and rhythm no Murmur, Rub or gallop 6. Lungs: some occasional wheezes and crackles   7. Abdomen: Soft, non-tender, slightly distended 8. Lower extremities: no clubbing, cyanosis, or edema 9. Neurologically Grossly intact, moving all 4 extremities equally 10. MSK: Normal range of motion  body mass index is unknown because there is no weight on file.   Labs on Admission:   Results for orders placed or performed during the hospital encounter of 08/24/15 (from the past 24  hour(s))  CBC with Differential/Platelet     Status: Abnormal   Collection Time: 08/24/15  1:16 PM  Result Value Ref Range   WBC 5.3 4.0 -  10.5 K/uL   RBC 3.63 (L) 3.87 - 5.11 MIL/uL   Hemoglobin 10.6 (L) 12.0 - 15.0 g/dL   HCT 33.4 (L) 36.0 - 46.0 %   MCV 92.0 78.0 - 100.0 fL   MCH 29.2 26.0 - 34.0 pg   MCHC 31.7 30.0 - 36.0 g/dL   RDW 17.5 (H) 11.5 - 15.5 %   Platelets 105 (L) 150 - 400 K/uL   Neutrophils Relative % 72 %   Neutro Abs 3.9 1.7 - 7.7 K/uL   Lymphocytes Relative 19 %   Lymphs Abs 1.0 0.7 - 4.0 K/uL   Monocytes Relative 5 %   Monocytes Absolute 0.3 0.1 - 1.0 K/uL   Eosinophils Relative 4 %   Eosinophils Absolute 0.2 0.0 - 0.7 K/uL   Basophils Relative 0 %   Basophils Absolute 0.0 0.0 - 0.1 K/uL  Hepatic function panel     Status: Abnormal   Collection Time: 08/24/15  1:16 PM  Result Value Ref Range   Total Protein 7.9 6.5 - 8.1 g/dL   Albumin 4.0 3.5 - 5.0 g/dL   AST 28 15 - 41 U/L   ALT 18 14 - 54 U/L   Alkaline Phosphatase 39 38 - 126 U/L   Total Bilirubin 0.6 0.3 - 1.2 mg/dL   Bilirubin, Direct <0.1 (L) 0.1 - 0.5 mg/dL   Indirect Bilirubin NOT CALCULATED 0.3 - 0.9 mg/dL  Brain natriuretic peptide     Status: None   Collection Time: 08/24/15  1:16 PM  Result Value Ref Range   B Natriuretic Peptide 60.9 0.0 - 100.0 pg/mL  I-stat troponin, ED     Status: None   Collection Time: 08/24/15  1:24 PM  Result Value Ref Range   Troponin i, poc 0.02 0.00 - 0.08 ng/mL   Comment 3          I-stat chem 8, ed     Status: Abnormal   Collection Time: 08/24/15  1:26 PM  Result Value Ref Range   Sodium 139 135 - 145 mmol/L   Potassium 3.9 3.5 - 5.1 mmol/L   Chloride 101 101 - 111 mmol/L   BUN 35 (H) 6 - 20 mg/dL   Creatinine, Ser 1.70 (H) 0.44 - 1.00 mg/dL   Glucose, Bld 231 (H) 65 - 99 mg/dL   Calcium, Ion 1.14 1.13 - 1.30 mmol/L   TCO2 28 0 - 100 mmol/L   Hemoglobin 11.2 (L) 12.0 - 15.0 g/dL   HCT 33.0 (L) 36.0 - 46.0 %  I-Stat CG4 Lactic Acid, ED     Status: None   Collection Time: 08/24/15  1:27 PM  Result Value Ref Range   Lactic Acid, Venous 1.58 0.5 - 2.0 mmol/L  I-Stat CG4 Lactic Acid, ED      Status: None   Collection Time: 08/24/15  4:07 PM  Result Value Ref Range   Lactic Acid, Venous 1.42 0.5 - 2.0 mmol/L    UA not obtained  Lab Results  Component Value Date   HGBA1C 6.9* 06/11/2015    CrCl cannot be calculated (Unknown ideal weight.).  BNP (last 3 results) No results for input(s): PROBNP in the last 8760 hours.  Other results:  I have pearsonaly reviewed this: ECG REPORT  Rate:70  Rhythm: NSR ST&T Change: no ischemic changes QTC 466  There were no vitals filed for this visit.   Cultures:    Component Value Date/Time   SDES URINE, RANDOM 06/11/2010 1141   SPECREQUEST NONE 06/11/2010 1141   CULT NO GROWTH 5 DAYS 06/08/2010 1540   REPTSTATUS 06/12/2010 FINAL 06/11/2010 1141     Radiological Exams on Admission: Ct Angio Chest Pe W/cm &/or Wo Cm  08/24/2015  CLINICAL DATA:  80 year old female with a history of potential pulmonary embolism. Shortness of breath. EXAM: CT ANGIOGRAPHY CHEST WITH CONTRAST TECHNIQUE: Multidetector CT imaging of the chest was performed using the standard protocol during bolus administration of intravenous contrast. Multiplanar CT image reconstructions and MIPs were obtained to evaluate the vascular anatomy. CONTRAST:  69mL OMNIPAQUE IOHEXOL 350 MG/ML SOLN COMPARISON:  Multiple prior chest x-ray, comparison CT chest 06/08/2010, 03/22/2009 FINDINGS: Chest: Unremarkable appearance of the chest superficial soft tissues. No axillary or supraclavicular adenopathy. Heterogeneous soft tissue nodule with well-defined borders within the right tracheoesophageal groove is unchanged in size over the course of 2 prior CT studies. Greatest diameter measures 2.5 cm. Several mediastinal lymph nodes, none of which are enlarged by CT size criteria or have suspicious features. Unremarkable appearance of the esophagus. Unremarkable course caliber and contour of the thoracic aorta without dissection flap, aneurysm, periaortic fluid. Minimal calcifications  of the aortic arch and of the proximal branch vessels. Branch vessels remain patent. No central, lobar, or segmental filling defects. Evaluation of the subsegmental branches limited by timing of the contrast bolus and motion artifact. Heart size within normal limits without pericardial fluid/ thickening. No confluent airspace disease. Bronchi wall thickening, particularly of the lower lobes. Pattern of mixed ground-glass and nodular opacities in a peribronchovascular distribution, predominantly of the lower lobes. Nodular opacity in the dependent aspects of the lower lobes. No pleural effusion. Upper abdomen: Unremarkable appearance of the visualized upper abdomen. Musculoskeletal: No displaced fracture. Degenerative changes of the visualized thoracic spine vacuum disc phenomenon at the T9-T10 level. Review of the MIP images confirms the above findings. IMPRESSION: Study is negative for pulmonary emboli. Mild bronchial wall thickening with associated pattern of ground-glass and nodular opacity in a peribronchovascular distribution of the lower lobes, potentially representing acute or chronic bronchitis. Correlation with lab values and patient history may be useful. No evidence of lobar pneumonia. Atherosclerosis. Unchanged size of right-sided tracheal esophageal soft tissue nodule, similar dating to the CT of 03/22/2009. Signed, Dulcy Fanny. Earleen Newport, DO [facility] Electronically Signed   By: Corrie Mckusick D.O.   On: 08/24/2015 17:16   Dg Chest Port 1 View  08/24/2015  CLINICAL DATA:  Patient with shortness of breath. Possible pulmonary embolism. EXAM: PORTABLE CHEST 1 VIEW COMPARISON:  Chest radiograph 09/22/2010. FINDINGS: Patient is rotated. Monitoring leads overlie the patient. Stable cardiomegaly. Pulmonary vascular redistribution and bilateral interstitial pulmonary opacities. No aggressive or acute appearing osseous lesions. IMPRESSION: Cardiomegaly.  Pulmonary redistribution and interstitial opacities may represent pulmonary edema. Atypical infectious process is not excluded. Electronically Signed   By: Lovey Newcomer M.D.   On: 08/24/2015 13:57    Chart has been reviewed  Family   at  Bedside  plan of care was discussed with  Daughter [name] [phone]  Assessment/Plan 80 yo W hx of sleep apnea, Hypertension presents with bronchitis-type of symptoms for the past 2 weeks with sudden onset of worsening respiratory symptoms resulting in acute respiratory failure with hypoxia  Present on Admission:  . Acute respiratory failure with hypoxia ([facility]CC) - patient currently requiring 3 L  etiology not quite clear no clear-cut findings of lobar pneumonia noted. Atypical pneumonia is a possibility. Versus pulmonary edema. Versus reactive airway disease given patient's history of tobacco abuse in the past. Patient has extensive wheezing which was helped by nebulizer treatment in the emergency department. Suggestive of reactive airway component. Will administer steroids and when necessary no butyl is scheduled Atrovent. We'll cover for atypical pneumonia and obtain Legionella antibodies. Given possibility of fluid overload we'll give 1 dose of Lasix and see for his diuresis with improvement. Will obtain echogram . Hypoxia treated oxygen workup for possible causes  . Sleep apnea - ordered CPap  . Essential hypertension for tonight hold losartan given some renal insufficiency  . Diabetes type 2, uncontrolled ([place]) order sliding scale monitor blood sugars while on steroids  . Bronchitis treated for reactive airway disease with steroids and antibiotics.   chronic kidney disease- creatinine at baseline continue to monitor  Prophylaxis:  Lovenox   CODE STATUS:  FULL CODE   as per patient    Disposition: To home once workup is complete and patient is stable  Other plan as per orders.  I have spent a total of 55 min on this  admission  Jasmon Mattice 08/24/2015, 7:11 PM  [facility]  Pager [phone]   after 2 AM please page floor coverage PA If 7AM-7PM, please contact the day team taking care of the patient  [email or web address]  Password [email or web address]

## 2015-08-24 NOTE — ED Notes (Signed)
Pt transported to CT; this RN at bedside now; restarting levaquin.

## 2015-08-24 NOTE — ED Notes (Signed)
Pt request meal tray; Schlossman made aware and verbalizes okay to order pt meal tray; Bevelyn Buckles orders/ordered meal tray at present time.

## 2015-08-24 NOTE — ED Notes (Signed)
Bed: [id number] Expected date:  Expected time:  Means of arrival:  Comments: Hold for [facility]

## 2015-08-25 ENCOUNTER — Inpatient Hospital Stay (HOSPITAL_COMMUNITY): Payer: Medicare Other

## 2015-08-25 DIAGNOSIS — R0600 Dyspnea, unspecified: Secondary | ICD-10-CM

## 2015-08-25 LAB — COMPREHENSIVE METABOLIC PANEL
ALT: 19 U/L (ref 14–54)
AST: 27 U/L (ref 15–41)
Albumin: 4.2 g/dL (ref 3.5–5.0)
Alkaline Phosphatase: 42 U/L (ref 38–126)
Anion gap: 12 (ref 5–15)
BUN: 43 mg/dL — ABNORMAL HIGH (ref 6–20)
CO2: 28 mmol/L (ref 22–32)
Calcium: 9.4 mg/dL (ref 8.9–10.3)
Chloride: 98 mmol/L — ABNORMAL LOW (ref 101–111)
Creatinine, Ser: 2.15 mg/dL — ABNORMAL HIGH (ref 0.44–1.00)
GFR calc Af Amer: 24 mL/min — ABNORMAL LOW (ref >60)
GFR calc non Af Amer: 21 mL/min — ABNORMAL LOW (ref >60)
Glucose, Bld: 505 mg/dL — ABNORMAL HIGH (ref 65–99)
Potassium: 4.5 mmol/L (ref 3.5–5.1)
Sodium: 138 mmol/L (ref 135–145)
Total Bilirubin: 0.6 mg/dL (ref 0.3–1.2)
Total Protein: 8.2 g/dL — ABNORMAL HIGH (ref 6.5–8.1)

## 2015-08-25 LAB — CBC WITH DIFFERENTIAL/PLATELET
Basophils Absolute: 0 10*3/uL (ref 0.0–0.1)
Basophils Relative: 0 %
Eosinophils Absolute: 0 10*3/uL (ref 0.0–0.7)
Eosinophils Relative: 0 %
HCT: 34.8 % — ABNORMAL LOW (ref 36.0–46.0)
Hemoglobin: 11 g/dL — ABNORMAL LOW (ref 12.0–15.0)
Lymphocytes Relative: 9 %
Lymphs Abs: 0.7 10*3/uL (ref 0.7–4.0)
MCH: 29.6 pg (ref 26.0–34.0)
MCHC: 31.6 g/dL (ref 30.0–36.0)
MCV: 93.8 fL (ref 78.0–100.0)
Monocytes Absolute: 0.1 10*3/uL (ref 0.1–1.0)
Monocytes Relative: 2 %
Neutro Abs: 7.1 10*3/uL (ref 1.7–7.7)
Neutrophils Relative %: 89 %
Platelets: 143 10*3/uL — ABNORMAL LOW (ref 150–400)
RBC: 3.71 MIL/uL — ABNORMAL LOW (ref 3.87–5.11)
RDW: 17.7 % — ABNORMAL HIGH (ref 11.5–15.5)
WBC: 7.9 10*3/uL (ref 4.0–10.5)

## 2015-08-25 LAB — URINALYSIS, ROUTINE W REFLEX MICROSCOPIC
Bilirubin Urine: NEGATIVE
Glucose, UA: >1000 mg/dL — AB
Ketones, ur: NEGATIVE mg/dL
Leukocytes, UA: NEGATIVE
Nitrite: NEGATIVE
Protein, ur: 100 mg/dL — AB
Specific Gravity, Urine: 1.029 (ref 1.005–1.030)
pH: 5 (ref 5.0–8.0)

## 2015-08-25 LAB — GLUCOSE, CAPILLARY
Glucose-Capillary: 485 mg/dL — ABNORMAL HIGH (ref 65–99)
Glucose-Capillary: 475 mg/dL — ABNORMAL HIGH (ref 65–99)
Glucose-Capillary: 373 mg/dL — ABNORMAL HIGH (ref 65–99)
Glucose-Capillary: 416 mg/dL — ABNORMAL HIGH (ref 65–99)

## 2015-08-25 LAB — CREATININE, SERUM
Creatinine, Ser: 1.93 mg/dL — ABNORMAL HIGH (ref 0.44–1.00)
GFR calc Af Amer: 28 mL/min — ABNORMAL LOW (ref >60)
GFR calc non Af Amer: 24 mL/min — ABNORMAL LOW (ref >60)

## 2015-08-25 LAB — STREP PNEUMONIAE URINARY ANTIGEN: Strep Pneumo Urinary Antigen: NEGATIVE

## 2015-08-25 LAB — INFLUENZA PANEL BY PCR (TYPE A & B)
H1N1 flu by pcr: NOT DETECTED
Influenza A By PCR: NEGATIVE
Influenza B By PCR: NEGATIVE

## 2015-08-25 LAB — TROPONIN I
Troponin I: 0.03 ng/mL (ref <0.031)
Troponin I: 0.03 ng/mL (ref <0.031)
Troponin I: 0.03 ng/mL (ref <0.031)

## 2015-08-25 LAB — URINE MICROSCOPIC-ADD ON

## 2015-08-25 LAB — MAGNESIUM: Magnesium: 2.2 mg/dL (ref 1.7–2.4)

## 2015-08-25 LAB — PHOSPHORUS: Phosphorus: 4.5 mg/dL (ref 2.5–4.6)

## 2015-08-25 LAB — TSH: TSH: 0.715 u[IU]/mL (ref 0.350–4.500)

## 2015-08-25 MED ORDER — INSULIN ASPART PROT & ASPART (70-30 MIX) 100 UNIT/ML ~~LOC~~ SUSP
80.0000 [IU] | Freq: Two times a day (BID) | SUBCUTANEOUS | Status: DC
  Administered 2015-08-25 – 2015-08-27 (×4): 80 [IU] via SUBCUTANEOUS
  Filled 2015-08-25: qty 10

## 2015-08-25 MED ORDER — INSULIN ASPART PROT & ASPART (70-30 MIX) 100 UNIT/ML ~~LOC~~ SUSP
70.0000 [IU] | Freq: Two times a day (BID) | SUBCUTANEOUS | Status: DC
  Filled 2015-08-25: qty 10

## 2015-08-25 MED ORDER — METHYLPREDNISOLONE SODIUM SUCC 40 MG IJ SOLR
40.0000 mg | Freq: Two times a day (BID) | INTRAMUSCULAR | Status: DC
  Administered 2015-08-25 – 2015-08-27 (×4): 40 mg via INTRAVENOUS
  Filled 2015-08-25 (×4): qty 1

## 2015-08-25 MED ORDER — IPRATROPIUM-ALBUTEROL 0.5-2.5 (3) MG/3ML IN SOLN
3.0000 mL | Freq: Three times a day (TID) | RESPIRATORY_TRACT | Status: DC
  Administered 2015-08-25 – 2015-08-29 (×12): 3 mL via RESPIRATORY_TRACT
  Filled 2015-08-25 (×14): qty 3

## 2015-08-25 MED ORDER — INSULIN ASPART 100 UNIT/ML ~~LOC~~ SOLN
6.0000 [IU] | Freq: Once | SUBCUTANEOUS | Status: AC
  Administered 2015-08-25: 6 [IU] via SUBCUTANEOUS

## 2015-08-25 NOTE — Progress Notes (Signed)
Pt ambulated in hall, SPO2 sats between 92% without oxygen. Will continue to monitor.

## 2015-08-25 NOTE — Progress Notes (Signed)
0800 CBG 485, MD on unit and made aware. Ordered to give 15 units per SSI orders and 60 units of 70/30 per Doug Sou Callie Fielding RN

## 2015-08-25 NOTE — Progress Notes (Signed)
Pt unsure if she wants to use CPAP tonight.  Pt had RT setup machine and stated she will call if she decides to wear.  RT to monitor and assess as needed.

## 2015-08-25 NOTE — Progress Notes (Signed)
Echocardiogram 2D Echocardiogram has been performed.  [name] 08/25/2015, 1:01 PM

## 2015-08-25 NOTE — Progress Notes (Signed)
[facility] PROGRESS NOTE  KERRILYNN CURATOLA [id number] DOB: [birth year] DOA: 08/24/2015 PCP: Dorothyann Peng, NP  Assessment/Plan:  . Acute respiratory failure with hypoxia (HCC) -clinically suspect pneumonia with reactive airway disease, unable to r/o CHF component -continue IV levaquin, duonebs , IV solumedrol -hold lasix -FU ECHO -repeat CXR tomorrow -FLu PCR negative  .OSA -CPap QHS  . Essential hypertension  -hold Losartan  . Diabetes type 2, uncontrolled  -due to steroids -increase Insulin 70/30 to 80unit BID, SSI  . AKI on CKD 3 -baseline craetinine around 1.5 -hold ARB, lasix held -monitor closely for now, Bmet in am    Morbid Obesity  Prophylaxis: Lovenox   Code Status: Full Code Family Communication:none at bedside Disposition Plan: home in ?2days   HPI/Subjective: Breathing better  Objective: Filed Vitals:   08/24/15 2020 08/25/15 0500  BP: 174/57 147/38  Pulse: 63 83  Temp: 97.3 F (36.3 C) 98 F (36.7 C)  Resp: 20 20    Intake/Output Summary (Last 24 hours) at 08/25/15 1018 Last data filed at 08/25/15 [id number]  Gross per 24 hour  Intake      0 ml  Output   1700 ml  Net  -1700 ml   Filed Weights   08/24/15 2020  Weight: 107.4 kg (236 lb 12.4 oz)    Exam:   General:  AAOx3, no distress, obese  Cardiovascular: S1S2/RRR  Respiratory: diffuse B/L ronchi  Abdomen: soft, NT, BS present  Musculoskeletal:no edema c/c   Data Reviewed: Basic Metabolic Panel:  Recent Labs Lab 08/24/15 1326 08/24/15 2302 08/25/15 0505  NA 139  --  138  K 3.9  --  4.5  CL 101  --  98*  CO2  --   --  28  GLUCOSE 231*  --  505*  BUN 35*  --  43*  CREATININE 1.70* 1.93* 2.15*  CALCIUM  --   --  9.4  MG  --   --  2.2  PHOS  --   --  4.5   Liver Function Tests:  Recent Labs Lab 08/24/15 1316 08/25/15 0505  AST 28 27  ALT 18 19  ALKPHOS 39 42  BILITOT 0.6 0.6  PROT 7.9 8.2*  ALBUMIN 4.0 4.2   No results for input(s): LIPASE,  AMYLASE in the last 168 hours. No results for input(s): AMMONIA in the last 168 hours. CBC:  Recent Labs Lab 08/24/15 1316 08/24/15 1326 08/24/15 2302 08/25/15 0505  WBC 5.3  --  5.8 7.9  NEUTROABS 3.9  --   --  7.1  HGB 10.6* 11.2* 10.3* 11.0*  HCT 33.4* 33.0* 32.3* 34.8*  MCV 92.0  --  93.9 93.8  PLT 105*  --  116* 143*   Cardiac Enzymes:  Recent Labs Lab 08/24/15 2302 08/25/15 0505  TROPONINI 0.03 0.03   BNP (last 3 results)  Recent Labs  08/24/15 1316  BNP 60.9    ProBNP (last 3 results) No results for input(s): PROBNP in the last 8760 hours.  CBG:  Recent Labs Lab 08/24/15 2028 08/25/15 0744  GLUCAP 435* 485*    No results found for this or any previous visit (from the past 240 hour(s)).   Studies: Ct Angio Chest Pe W/cm &/or Wo Cm  08/24/2015  CLINICAL DATA:  80 year old female with a history of potential pulmonary embolism. Shortness of breath. EXAM: CT ANGIOGRAPHY CHEST WITH CONTRAST TECHNIQUE: Multidetector CT imaging of the chest was performed using the standard protocol during bolus administration of intravenous contrast. Multiplanar  CT image reconstructions and MIPs were obtained to evaluate the vascular anatomy. CONTRAST:  87mL OMNIPAQUE IOHEXOL 350 MG/ML SOLN COMPARISON:  Multiple prior chest x-ray, comparison CT chest 06/08/2010, 03/22/2009 FINDINGS: Chest: Unremarkable appearance of the chest superficial soft tissues. No axillary or supraclavicular adenopathy. Heterogeneous soft tissue nodule with well-defined borders within the right tracheoesophageal groove is unchanged in size over the course of 2 prior CT studies. Greatest diameter measures 2.5 cm. Several mediastinal lymph nodes, none of which are enlarged by CT size criteria or have suspicious features. Unremarkable appearance of the esophagus. Unremarkable course caliber and contour of the thoracic aorta without dissection flap, aneurysm, periaortic fluid. Minimal calcifications of the aortic  arch and of the proximal branch vessels. Branch vessels remain patent. No central, lobar, or segmental filling defects. Evaluation of the subsegmental branches limited by timing of the contrast bolus and motion artifact. Heart size within normal limits without pericardial fluid/ thickening. No confluent airspace disease. Bronchi wall thickening, particularly of the lower lobes. Pattern of mixed ground-glass and nodular opacities in a peribronchovascular distribution, predominantly of the lower lobes. Nodular opacity in the dependent aspects of the lower lobes. No pleural effusion. Upper abdomen: Unremarkable appearance of the visualized upper abdomen. Musculoskeletal: No displaced fracture. Degenerative changes of the visualized thoracic spine vacuum disc phenomenon at the T9-T10 level. Review of the MIP images confirms the above findings. IMPRESSION: Study is negative for pulmonary emboli. Mild bronchial wall thickening with associated pattern of ground-glass and nodular opacity in a peribronchovascular distribution of the lower lobes, potentially representing acute or chronic bronchitis. Correlation with lab values and patient history may be useful. No evidence of lobar pneumonia. Atherosclerosis. Unchanged size of right-sided tracheal esophageal soft tissue nodule, similar dating to the CT of 03/22/2009. Signed, Dulcy Fanny. Earleen Newport, DO [facility] Rad[facility] Electronically Signed   By: Corrie Mckusick D.O.   On: 08/24/2015 17:16   Dg Chest Port 1 View  08/24/2015  CLINICAL DATA:  Patient with shortness of breath. Possible pulmonary embolism. EXAM: PORTABLE CHEST 1 VIEW COMPARISON:  Chest radiograph 09/22/2010. FINDINGS: Patient is rotated. Monitoring leads overlie the patient. Stable cardiomegaly. Pulmonary vascular redistribution and bilateral interstitial pulmonary opacities. No aggressive or acute appearing osseous lesions. IMPRESSION: Cardiomegaly. Pulmonary  redistribution and interstitial opacities may represent pulmonary edema. Atypical infectious process is not excluded. Electronically Signed   By: Lovey Newcomer M.D.   On: 08/24/2015 13:57    Scheduled Meds: . atorvastatin  20 mg Oral Daily  . azithromycin  500 mg Intravenous Q24H  . cefTRIAXone (ROCEPHIN)  IV  1 g Intravenous Q24H  . enoxaparin (LOVENOX) injection  30 mg Subcutaneous Q24H  . famotidine  10 mg Oral Daily  . fenofibrate  54 mg Oral Daily  . insulin aspart  0-15 Units Subcutaneous TID WC  . insulin aspart  0-5 Units Subcutaneous QHS  . insulin aspart protamine- aspart  70 Units Subcutaneous BID WC  . ipratropium-albuterol  3 mL Nebulization TID  . levothyroxine  112 mcg Oral QAC breakfast  . methylPREDNISolone (SOLU-MEDROL) injection  40 mg Intravenous Q12H  . oxybutynin  5 mg Oral Daily  . sertraline  50 mg Oral Daily  . tamsulosin  0.4 mg Oral Daily   Continuous Infusions:  Antibiotics Given (last 72 hours)    None      Active Problems:   Diabetes type 2, uncontrolled (HCC)   Essential hypertension   Sleep apnea   Hypoxia   Bronchitis  Acute respiratory failure with hypoxia ([place])   Chronic kidney disease    Time spent: 22min    Olia Hinderliter  [facility] Pager [phone]. If 7PM-7AM, please contact night-coverage at [email or web address], password [facility] 08/25/2015, 10:18 AM  LOS: 1 day

## 2015-08-25 NOTE — Progress Notes (Signed)
CPAP is by patient bedside and ready for use.  Patient said she would ask RN to call Respiratory if and when she is ready to wear.

## 2015-08-26 ENCOUNTER — Inpatient Hospital Stay (HOSPITAL_COMMUNITY): Payer: Medicare Other

## 2015-08-26 ENCOUNTER — Ambulatory Visit: Payer: Medicare Other | Admitting: Physical Therapy

## 2015-08-26 ENCOUNTER — Other Ambulatory Visit: Payer: Self-pay | Admitting: Adult Health

## 2015-08-26 LAB — GLUCOSE, CAPILLARY
Glucose-Capillary: 345 mg/dL — ABNORMAL HIGH (ref 65–99)
Glucose-Capillary: 285 mg/dL — ABNORMAL HIGH (ref 65–99)
Glucose-Capillary: 258 mg/dL — ABNORMAL HIGH (ref 65–99)
Glucose-Capillary: 367 mg/dL — ABNORMAL HIGH (ref 65–99)

## 2015-08-26 LAB — CBC
HCT: 31.6 % — ABNORMAL LOW (ref 36.0–46.0)
Hemoglobin: 10.1 g/dL — ABNORMAL LOW (ref 12.0–15.0)
MCH: 29.4 pg (ref 26.0–34.0)
MCHC: 32 g/dL (ref 30.0–36.0)
MCV: 92.1 fL (ref 78.0–100.0)
Platelets: 135 10*3/uL — ABNORMAL LOW (ref 150–400)
RBC: 3.43 MIL/uL — ABNORMAL LOW (ref 3.87–5.11)
RDW: 17.4 % — ABNORMAL HIGH (ref 11.5–15.5)
WBC: 8.5 10*3/uL (ref 4.0–10.5)

## 2015-08-26 LAB — BASIC METABOLIC PANEL
Anion gap: 11 (ref 5–15)
BUN: 57 mg/dL — ABNORMAL HIGH (ref 6–20)
CO2: 28 mmol/L (ref 22–32)
Calcium: 9.3 mg/dL (ref 8.9–10.3)
Chloride: 96 mmol/L — ABNORMAL LOW (ref 101–111)
Creatinine, Ser: 2.23 mg/dL — ABNORMAL HIGH (ref 0.44–1.00)
GFR calc Af Amer: 23 mL/min — ABNORMAL LOW (ref >60)
GFR calc non Af Amer: 20 mL/min — ABNORMAL LOW (ref >60)
Glucose, Bld: 338 mg/dL — ABNORMAL HIGH (ref 65–99)
Potassium: 4.6 mmol/L (ref 3.5–5.1)
Sodium: 135 mmol/L (ref 135–145)

## 2015-08-26 LAB — LEGIONELLA ANTIGEN, URINE

## 2015-08-26 LAB — HEMOGLOBIN A1C
Hgb A1c MFr Bld: 8.4 % — ABNORMAL HIGH (ref 4.8–5.6)
Mean Plasma Glucose: 194 mg/dL

## 2015-08-26 LAB — PROCALCITONIN: Procalcitonin: <0.1 ng/mL

## 2015-08-26 LAB — LEGIONELLA PNEUMOPHILA TOTAL AB: Legionella Pneumo Total Ab: <0.91 OD ratio (ref 0.00–0.90)

## 2015-08-26 LAB — BRAIN NATRIURETIC PEPTIDE: B Natriuretic Peptide: 151.4 pg/mL — ABNORMAL HIGH (ref 0.0–100.0)

## 2015-08-26 MED ORDER — FUROSEMIDE 10 MG/ML IJ SOLN
40.0000 mg | Freq: Two times a day (BID) | INTRAMUSCULAR | Status: DC
  Administered 2015-08-26 – 2015-08-28 (×4): 40 mg via INTRAVENOUS
  Filled 2015-08-26 (×4): qty 4

## 2015-08-26 MED ORDER — FUROSEMIDE 10 MG/ML IJ SOLN
40.0000 mg | Freq: Once | INTRAMUSCULAR | Status: AC
  Administered 2015-08-26: 40 mg via INTRAVENOUS
  Filled 2015-08-26: qty 4

## 2015-08-26 NOTE — Progress Notes (Signed)
Spoke with patient regarding CPAP. Patient will have nurse call when ready to go on.

## 2015-08-26 NOTE — Progress Notes (Addendum)
[facility] PROGRESS NOTE  [name] [id number] DOB: Jun 01, 1935 DOA: 08/24/2015 PCP: Dorothyann Peng, NP  Assessment/Plan:  . Acute respiratory failure with hypoxia (HCC) -clinically suspect pneumonia with reactive airway disease, also CHF component possible -continue IV levaquin, duonebs , IV solumedrol -CXr with interstitial edema too,  BNP only 151 but obesity could affect this too, IV lasix x1 today -ECHO with normal EFwall motion and grade 2DD -FLu PCR negative  .OSA -CPap QHS  . Essential hypertension  -hold Losartan  . Diabetes type 2, uncontrolled  -due to steroids -increased Insulin 70/30 to 80unit BID, SSI -improving  . AKI on CKD 3 -baseline craetinine around 1.5-1.9 in last year -hold ARB, lasix x1 today -creatinine bumped, will check Renal US, h/o multiple calculi and stents, last at [facility] 8/16    Morbid Obesity  Prophylaxis: Lovenox   Code Status: Full Code Family Communication:none at bedside Disposition Plan: home in ?1-2days   HPI/Subjective: Breathing better  Objective: Filed Vitals:   08/26/15 0501 08/26/15 1412  BP: 133/62 147/55  Pulse: 68 66  Temp: 97.7 F (36.5 C) 98 F (36.7 C)  Resp: 18 20    Intake/Output Summary (Last 24 hours) at 08/26/15 1417 Last data filed at 08/26/15 1300  Gross per 24 hour  Intake   1260 ml  Output   1850 ml  Net   -590 ml   Filed Weights   08/24/15 2020  Weight: 107.4 kg (236 lb 12.4 oz)    Exam:   General:  AAOx3, no distress, obese  Cardiovascular: S1S2/RRR  Respiratory: diffuse B/L ronchi  Abdomen: soft, NT, BS present  Musculoskeletal 1plus  edema c/c   Data Reviewed: Basic Metabolic Panel:  Recent Labs Lab 08/24/15 1326 08/24/15 2302 08/25/15 0505 08/26/15 0444  NA 139  --  138 135  K 3.9  --  4.5 4.6  CL 101  --  98* 96*  CO2  --   --  28 28  GLUCOSE 231*  --  505* 338*  BUN 35*  --  43* 57*  CREATININE 1.70* 1.93* 2.15* 2.23*  CALCIUM  --   --  9.4 9.3   MG  --   --  2.2  --   PHOS  --   --  4.5  --    Liver Function Tests:  Recent Labs Lab 08/24/15 1316 08/25/15 0505  AST 28 27  ALT 18 19  ALKPHOS 39 42  BILITOT 0.6 0.6  PROT 7.9 8.2*  ALBUMIN 4.0 4.2   No results for input(s): LIPASE, AMYLASE in the last 168 hours. No results for input(s): AMMONIA in the last 168 hours. CBC:  Recent Labs Lab 08/24/15 1316 08/24/15 1326 08/24/15 2302 08/25/15 0505 08/26/15 0444  WBC 5.3  --  5.8 7.9 8.5  NEUTROABS 3.9  --   --  7.1  --   HGB 10.6* 11.2* 10.3* 11.0* 10.1*  HCT 33.4* 33.0* 32.3* 34.8* 31.6*  MCV 92.0  --  93.9 93.8 92.1  PLT 105*  --  116* 143* 135*   Cardiac Enzymes:  Recent Labs Lab 08/24/15 2302 08/25/15 0505 08/25/15 1011  TROPONINI 0.03 0.03 0.03   BNP (last 3 results)  Recent Labs  08/24/15 1316 08/26/15 0444  BNP 60.9 151.4*    ProBNP (last 3 results) No results for input(s): PROBNP in the last 8760 hours.  CBG:  Recent Labs Lab 08/25/15 1121 08/25/15 1643 08/25/15 2053 08/26/15 0735 08/26/15 1200  GLUCAP 475* 373* 416* 345*  285*    Recent Results (from the past 240 hour(s))  Urine culture     Status: None (Preliminary result)   Collection Time: 08/24/15 11:53 PM  Result Value Ref Range Status   Specimen Description URINE, RANDOM  Final   Special Requests NONE  Final   Culture   Final    80,000 COLONIES/ml STAPHYLOCOCCUS AUREUS Performed at [facility]    Report Status PENDING  Incomplete     Studies: Dg Chest 2 View  08/26/2015  CLINICAL DATA:  Cough. EXAM: CHEST  2 VIEW COMPARISON:  08/24/2015 . FINDINGS: Cardiomegaly with pulmonary vascular prominence and bilateral interstitial prominence noted consistent with congestive heart failure. Interim improvement from prior exam. No pleural effusion or pneumothorax. No acute bony abnormality. IMPRESSION: Congestive heart failure with bilateral pulmonary interstitial prominence consistent with congestive heart failure.  Interim improvement from 08/24/2015 . Electronically Signed   By: Marcello Moores  Register   On: 08/26/2015 08:17   Ct Angio Chest Pe W/cm &/or Wo Cm  08/24/2015  CLINICAL DATA:  80 year old female with a history of potential pulmonary embolism. Shortness of breath. EXAM: CT ANGIOGRAPHY CHEST WITH CONTRAST TECHNIQUE: Multidetector CT imaging of the chest was performed using the standard protocol during bolus administration of intravenous contrast. Multiplanar CT image reconstructions and MIPs were obtained to evaluate the vascular anatomy. CONTRAST:  73mL OMNIPAQUE IOHEXOL 350 MG/ML SOLN COMPARISON:  Multiple prior chest x-ray, comparison CT chest 06/08/2010, 03/22/2009 FINDINGS: Chest: Unremarkable appearance of the chest superficial soft tissues. No axillary or supraclavicular adenopathy. Heterogeneous soft tissue nodule with well-defined borders within the right tracheoesophageal groove is unchanged in size over the course of 2 prior CT studies. Greatest diameter measures 2.5 cm. Several mediastinal lymph nodes, none of which are enlarged by CT size criteria or have suspicious features. Unremarkable appearance of the esophagus. Unremarkable course caliber and contour of the thoracic aorta without dissection flap, aneurysm, periaortic fluid. Minimal calcifications of the aortic arch and of the proximal branch vessels. Branch vessels remain patent. No central, lobar, or segmental filling defects. Evaluation of the subsegmental branches limited by timing of the contrast bolus and motion artifact. Heart size within normal limits without pericardial fluid/ thickening. No confluent airspace disease. Bronchi wall thickening, particularly of the lower lobes. Pattern of mixed ground-glass and nodular opacities in a peribronchovascular distribution, predominantly of the lower lobes. Nodular opacity in the dependent aspects of the lower lobes. No pleural effusion. Upper abdomen: Unremarkable appearance of the visualized upper  abdomen. Musculoskeletal: No displaced fracture. Degenerative changes of the visualized thoracic spine vacuum disc phenomenon at the T9-T10 level. Review of the MIP images confirms the above findings. IMPRESSION: Study is negative for pulmonary emboli. Mild bronchial wall thickening with associated pattern of ground-glass and nodular opacity in a peribronchovascular distribution of the lower lobes, potentially representing acute or chronic bronchitis. Correlation with lab values and patient history may be useful. No evidence of lobar pneumonia. Atherosclerosis. Unchanged size of right-sided tracheal esophageal soft tissue nodule, similar dating to the CT of 03/22/2009. Signed, Dulcy Fanny. Earleen Newport, DO [facility] Radiology Electronically Signed   By: Corrie Mckusick D.O.   On: 08/24/2015 17:16    Scheduled Meds: . atorvastatin  20 mg Oral Daily  . azithromycin  500 mg Intravenous Q24H  . cefTRIAXone (ROCEPHIN)  IV  1 g Intravenous Q24H  . enoxaparin (LOVENOX) injection  30 mg Subcutaneous Q24H  . famotidine  10 mg Oral Daily  . fenofibrate  54  mg Oral Daily  . insulin aspart  0-15 Units Subcutaneous TID WC  . insulin aspart  0-5 Units Subcutaneous QHS  . insulin aspart protamine- aspart  80 Units Subcutaneous BID WC  . ipratropium-albuterol  3 mL Nebulization TID  . levothyroxine  112 mcg Oral QAC breakfast  . methylPREDNISolone (SOLU-MEDROL) injection  40 mg Intravenous Q12H  . oxybutynin  5 mg Oral Daily  . sertraline  50 mg Oral Daily  . tamsulosin  0.4 mg Oral Daily   Continuous Infusions:  Antibiotics Given (last 72 hours)    Date/Time Action Medication Dose Rate   08/25/15 1540 Given   azithromycin (ZITHROMAX) 500 mg in dextrose 5 % 250 mL IVPB 500 mg 250 mL/hr   08/25/15 1657 Given   cefTRIAXone (ROCEPHIN) 1 g in dextrose 5 % 50 mL IVPB 1 g 100 mL/hr      Active Problems:   Diabetes type 2, uncontrolled (HCC)   Essential hypertension    Sleep apnea   Hypoxia   Bronchitis   Acute respiratory failure with hypoxia ([place])   Chronic kidney disease    Time spent: 71min    Garvey Westcott  [facility] Pager [phone]. If 7PM-7AM, please contact night-coverage at [email or web address], password [facility] 08/26/2015, 2:17 PM  LOS: 2 days

## 2015-08-27 LAB — CBC
HCT: 32 % — ABNORMAL LOW (ref 36.0–46.0)
Hemoglobin: 10.6 g/dL — ABNORMAL LOW (ref 12.0–15.0)
MCH: 29.8 pg (ref 26.0–34.0)
MCHC: 33.1 g/dL (ref 30.0–36.0)
MCV: 89.9 fL (ref 78.0–100.0)
Platelets: 155 10*3/uL (ref 150–400)
RBC: 3.56 MIL/uL — ABNORMAL LOW (ref 3.87–5.11)
RDW: 17.1 % — ABNORMAL HIGH (ref 11.5–15.5)
WBC: 9.1 10*3/uL (ref 4.0–10.5)

## 2015-08-27 LAB — GLUCOSE, CAPILLARY
Glucose-Capillary: 352 mg/dL — ABNORMAL HIGH (ref 65–99)
Glucose-Capillary: 344 mg/dL — ABNORMAL HIGH (ref 65–99)
Glucose-Capillary: 239 mg/dL — ABNORMAL HIGH (ref 65–99)
Glucose-Capillary: 284 mg/dL — ABNORMAL HIGH (ref 65–99)

## 2015-08-27 LAB — BASIC METABOLIC PANEL
Anion gap: 13 (ref 5–15)
BUN: 60 mg/dL — ABNORMAL HIGH (ref 6–20)
CO2: 30 mmol/L (ref 22–32)
Calcium: 9.8 mg/dL (ref 8.9–10.3)
Chloride: 93 mmol/L — ABNORMAL LOW (ref 101–111)
Creatinine, Ser: 2.21 mg/dL — ABNORMAL HIGH (ref 0.44–1.00)
GFR calc Af Amer: 23 mL/min — ABNORMAL LOW (ref >60)
GFR calc non Af Amer: 20 mL/min — ABNORMAL LOW (ref >60)
Glucose, Bld: 304 mg/dL — ABNORMAL HIGH (ref 65–99)
Potassium: 4.3 mmol/L (ref 3.5–5.1)
Sodium: 136 mmol/L (ref 135–145)

## 2015-08-27 MED ORDER — PREDNISONE 20 MG PO TABS
40.0000 mg | ORAL_TABLET | Freq: Every day | ORAL | Status: AC
  Administered 2015-08-27: 40 mg via ORAL
  Filled 2015-08-27: qty 2

## 2015-08-27 MED ORDER — INSULIN ASPART PROT & ASPART (70-30 MIX) 100 UNIT/ML ~~LOC~~ SUSP
85.0000 [IU] | Freq: Two times a day (BID) | SUBCUTANEOUS | Status: DC
  Administered 2015-08-27 – 2015-08-29 (×4): 85 [IU] via SUBCUTANEOUS

## 2015-08-27 MED ORDER — LEVOFLOXACIN 250 MG PO TABS
250.0000 mg | ORAL_TABLET | Freq: Every day | ORAL | Status: AC
  Administered 2015-08-27 – 2015-08-28 (×2): 250 mg via ORAL
  Filled 2015-08-27 (×2): qty 1

## 2015-08-27 MED ORDER — PREDNISONE 20 MG PO TABS
20.0000 mg | ORAL_TABLET | Freq: Every day | ORAL | Status: AC
  Administered 2015-08-28: 20 mg via ORAL
  Filled 2015-08-27: qty 1

## 2015-08-27 NOTE — Care Management Important Message (Signed)
Important Message  Patient Details  Name: LATEEFAH NAPOLETANO MRN: [id number] Date of Birth: 27-Feb-1935   Medicare Important Message Given:  Yes    Camillo Flaming 08/27/2015, 11:17 AMImportant Message  Patient Details  Name: JULEE TUY MRN: [id number] Date of Birth: Jul 30, 1935   Medicare Important Message Given:  Yes    Camillo Flaming 08/27/2015, 11:17 AM

## 2015-08-27 NOTE — Progress Notes (Signed)
[facility] PROGRESS NOTE  [name] [id number] DOB: [birth year] DOA: 08/24/2015 PCP: Dorothyann Peng, NP   [name] is a 80 y.o. female history of Obesity, DM2, CKD3, Nephrolithiasis, h/o PE, OSA on CPAP Presented with URI type of symptoms for past 1 week, she was Levaquin to 1 week prior to admission., presented to ER due to worsening, cough, congestion, shortness of breath, wheezing. In emergency department CT angiogram showed no pulmonary embolism there is some groundglass and nodular opacity. Initially concern for Bronchitis/pneumonia with reactive airway disease, now clinical picture more c/w CHF, improving with diuresis.  Assessment/Plan:  . Acute respiratory failure with hypoxia (HCC) -Initially clinically suspected bronchitis/ pneumonia with reactive airway disease, now i think this is primarily CHF. -change to PO levaquin for 31more days, cut down solumedrol and quick taper off steroids -FLu PCR negative -improving, see below, wean O2  Acute Diastolic CHF -CXr with interstitial edema on repeat CXR 12/19 -BNP only 151 but could be falsely low in Obesity, continue IV lasix today, could change to PO tomorrow -ECHO with normal EF and wall motion and grade 2DD -negative 2.8L  .OSA -CPap QHS  . Essential hypertension  -held Losartan due to renal insufficiency  . Diabetes type 2, uncontrolled  -due to steroids -increased Insulin 70/30 to 80unit BID, SSI -Hbaic was 8.4  . AKI on CKD 3 -baseline craetinine around 1.5-1.9 in last year -hold ARB, lasix x1 today -creatinine slightly higher than baseline in setting of CHF/diuresis -Renal US, without hydronephrosis, h/o multiple calculi and stents, last at [facility] 8/16    Morbid Obesity  Ambulate, Pt/OT  Prophylaxis: Lovenox   Code Status: Full Code Family Communication:none at bedside Disposition Plan: home in ?1-2days   HPI/Subjective: Breathing better  Objective: Filed Vitals:   08/26/15 2054  08/27/15 0444  BP:  146/59  Pulse: 64 71  Temp:  97.4 F (36.3 C)  Resp: 16 16    Intake/Output Summary (Last 24 hours) at 08/27/15 0951 Last data filed at 08/26/15 2300  Gross per 24 hour  Intake    540 ml  Output   1500 ml  Net   -960 ml   Filed Weights   08/24/15 2020  Weight: 107.4 kg (236 lb 12.4 oz)    Exam:   General:  AAOx3, no distress, obese  Cardiovascular: S1S2/RRR  Respiratory: diffuse B/L ronchi  Abdomen: soft, NT, BS present  Musculoskeletal 1plus  edema c/c   Data Reviewed: Basic Metabolic Panel:  Recent Labs Lab 08/24/15 1326 08/24/15 2302 08/25/15 0505 08/26/15 0444 08/27/15 0444  NA 139  --  138 135 136  K 3.9  --  4.5 4.6 4.3  CL 101  --  98* 96* 93*  CO2  --   --  28 28 30   GLUCOSE 231*  --  505* 338* 304*  BUN 35*  --  43* 57* 60*  CREATININE 1.70* 1.93* 2.15* 2.23* 2.21*  CALCIUM  --   --  9.4 9.3 9.8  MG  --   --  2.2  --   --   PHOS  --   --  4.5  --   --    Liver Function Tests:  Recent Labs Lab 08/24/15 1316 08/25/15 0505  AST 28 27  ALT 18 19  ALKPHOS 39 42  BILITOT 0.6 0.6  PROT 7.9 8.2*  ALBUMIN 4.0 4.2   No results for input(s): LIPASE, AMYLASE in the last 168 hours. No results for input(s): AMMONIA in  the last 168 hours. CBC:  Recent Labs Lab 08/24/15 1316 08/24/15 1326 08/24/15 2302 08/25/15 0505 08/26/15 0444 08/27/15 0444  WBC 5.3  --  5.8 7.9 8.5 9.1  NEUTROABS 3.9  --   --  7.1  --   --   HGB 10.6* 11.2* 10.3* 11.0* 10.1* 10.6*  HCT 33.4* 33.0* 32.3* 34.8* 31.6* 32.0*  MCV 92.0  --  93.9 93.8 92.1 89.9  PLT 105*  --  116* 143* 135* 155   Cardiac Enzymes:  Recent Labs Lab 08/24/15 2302 08/25/15 0505 08/25/15 1011  TROPONINI 0.03 0.03 0.03   BNP (last 3 results)  Recent Labs  08/24/15 1316 08/26/15 0444  BNP 60.9 151.4*    ProBNP (last 3 results) No results for input(s): PROBNP in the last 8760 hours.  CBG:  Recent Labs Lab 08/26/15 0735 08/26/15 1200 08/26/15 1622  08/26/15 2035 08/27/15 0724  GLUCAP 345* 285* 258* 367* 352*    Recent Results (from the past 240 hour(s))  Urine culture     Status: None (Preliminary result)   Collection Time: 08/24/15 11:53 PM  Result Value Ref Range Status   Specimen Description URINE, RANDOM  Final   Special Requests NONE  Final   Culture   Final    80,000 COLONIES/ml METHICILLIN RESISTANT STAPHYLOCOCCUS AUREUS Performed at [facility]    Report Status PENDING  Incomplete   Organism ID, Bacteria METHICILLIN RESISTANT STAPHYLOCOCCUS AUREUS  Final      Susceptibility   Methicillin resistant staphylococcus aureus - MIC*    CIPROFLOXACIN >=8 RESISTANT Resistant     GENTAMICIN <=0.5 SENSITIVE Sensitive     NITROFURANTOIN 32 SENSITIVE Sensitive     OXACILLIN >=4 RESISTANT Resistant     TETRACYCLINE <=1 SENSITIVE Sensitive     VANCOMYCIN <=0.5 SENSITIVE Sensitive     TRIMETH/SULFA <=10 SENSITIVE Sensitive     CLINDAMYCIN <=0.25 RESISTANT Resistant     RIFAMPIN <=0.5 SENSITIVE Sensitive     Inducible Clindamycin POSITIVE Resistant     * 80,000 COLONIES/ml METHICILLIN RESISTANT STAPHYLOCOCCUS AUREUS     Studies: Dg Chest 2 View  08/26/2015  CLINICAL DATA:  Cough. EXAM: CHEST  2 VIEW COMPARISON:  08/24/2015 . FINDINGS: Cardiomegaly with pulmonary vascular prominence and bilateral interstitial prominence noted consistent with congestive heart failure. Interim improvement from prior exam. No pleural effusion or pneumothorax. No acute bony abnormality. IMPRESSION: Congestive heart failure with bilateral pulmonary interstitial prominence consistent with congestive heart failure. Interim improvement from 08/24/2015 . Electronically Signed   By: Marcello Moores  Register   On: 08/26/2015 08:17   US Renal  08/26/2015  CLINICAL DATA:  80 year old female with acute renal injury. Initial encounter. EXAM: RENAL / URINARY TRACT ULTRASOUND COMPLETE COMPARISON:  CT Abdomen and Pelvis 10/30/2008 FINDINGS: Right Kidney: Length:  10.8 cm. Renal cortical volume loss appears congruent with age. Echogenicity within normal limits. No mass or hydronephrosis visualized. Left Kidney: Length: 11.4 cm. Cortical volume loss appears greater than that on the right side (image 15). Questionable nonobstructing midpole nephrolithiasis on image 19. Small simple appearing exophytic lower pole cyst (image 21). Cortical echogenicity within normal limits. No hydronephrosis or left renal mass. Bladder: Appears normal for degree of bladder distention. Other findings: Hepatic steatosis (image 3). IMPRESSION: 1. No hydronephrosis or acute renal findings. Negative urinary bladder. 2. Left greater than right renal cortical volume loss. Questionable nonobstructing left renal midpole calculus. 3. Hepatic steatosis. Electronically Signed   By: Genevie Ann M.D.   On: 08/26/2015 15:23  Scheduled Meds: . atorvastatin  20 mg Oral Daily  . azithromycin  500 mg Intravenous Q24H  . cefTRIAXone (ROCEPHIN)  IV  1 g Intravenous Q24H  . enoxaparin (LOVENOX) injection  30 mg Subcutaneous Q24H  . famotidine  10 mg Oral Daily  . fenofibrate  54 mg Oral Daily  . furosemide  40 mg Intravenous Q12H  . insulin aspart  0-15 Units Subcutaneous TID WC  . insulin aspart  0-5 Units Subcutaneous QHS  . insulin aspart protamine- aspart  80 Units Subcutaneous BID WC  . ipratropium-albuterol  3 mL Nebulization TID  . levothyroxine  112 mcg Oral QAC breakfast  . oxybutynin  5 mg Oral Daily  . [START ON 08/28/2015] predniSONE  20 mg Oral Q breakfast  . predniSONE  40 mg Oral Q breakfast  . sertraline  50 mg Oral Daily  . tamsulosin  0.4 mg Oral Daily   Continuous Infusions:  Antibiotics Given (last 72 hours)    Date/Time Action Medication Dose Rate   08/25/15 1540 Given   azithromycin (ZITHROMAX) 500 mg in dextrose 5 % 250 mL IVPB 500 mg 250 mL/hr   08/25/15 1657 Given   cefTRIAXone (ROCEPHIN) 1 g in dextrose 5 % 50 mL IVPB 1 g 100 mL/hr   08/26/15 1625 Given    cefTRIAXone (ROCEPHIN) 1 g in dextrose 5 % 50 mL IVPB 1 g 100 mL/hr   08/26/15 1716 Given   azithromycin (ZITHROMAX) 500 mg in dextrose 5 % 250 mL IVPB 500 mg 250 mL/hr      Active Problems:   Diabetes type 2, uncontrolled ([place])   Essential hypertension   Sleep apnea   Hypoxia   Bronchitis   Acute respiratory failure with hypoxia ([place])   Chronic kidney disease    Time spent: 109min    Baltasar Twilley  [facility] Pager [phone]. If 7PM-7AM, please contact night-coverage at [email or web address], password [facility] 08/27/2015, 9:51 AM  LOS: 3 days

## 2015-08-27 NOTE — Progress Notes (Addendum)
Pharmacy - Renal abx adjust  Assessment: 80 yoF on IV Rocephin for possible PNA/bronchitis.  500 mg PO daily ordered today.  CrCl 24 ml/min  Plan: Adjust Levaquin to 250 mg PO daily for CrCl 20-50 ml/min  Reuel Boom, PharmD, BCPS Pager: [phone] 08/27/2015, 10:13 AM

## 2015-08-28 ENCOUNTER — Ambulatory Visit: Payer: Medicare Other | Admitting: Physical Therapy

## 2015-08-28 LAB — CBC
HCT: 34.1 % — ABNORMAL LOW (ref 36.0–46.0)
Hemoglobin: 11.2 g/dL — ABNORMAL LOW (ref 12.0–15.0)
MCH: 29.6 pg (ref 26.0–34.0)
MCHC: 32.8 g/dL (ref 30.0–36.0)
MCV: 90.2 fL (ref 78.0–100.0)
Platelets: 178 10*3/uL (ref 150–400)
RBC: 3.78 MIL/uL — ABNORMAL LOW (ref 3.87–5.11)
RDW: 17.1 % — ABNORMAL HIGH (ref 11.5–15.5)
WBC: 9.2 10*3/uL (ref 4.0–10.5)

## 2015-08-28 LAB — GLUCOSE, CAPILLARY
Glucose-Capillary: 117 mg/dL — ABNORMAL HIGH (ref 65–99)
Glucose-Capillary: 95 mg/dL (ref 65–99)
Glucose-Capillary: 203 mg/dL — ABNORMAL HIGH (ref 65–99)
Glucose-Capillary: 169 mg/dL — ABNORMAL HIGH (ref 65–99)

## 2015-08-28 LAB — BASIC METABOLIC PANEL
Anion gap: 14 (ref 5–15)
BUN: 74 mg/dL — ABNORMAL HIGH (ref 6–20)
CO2: 34 mmol/L — ABNORMAL HIGH (ref 22–32)
Calcium: 9.8 mg/dL (ref 8.9–10.3)
Chloride: 91 mmol/L — ABNORMAL LOW (ref 101–111)
Creatinine, Ser: 2.29 mg/dL — ABNORMAL HIGH (ref 0.44–1.00)
GFR calc Af Amer: 22 mL/min — ABNORMAL LOW (ref >60)
GFR calc non Af Amer: 19 mL/min — ABNORMAL LOW (ref >60)
Glucose, Bld: 134 mg/dL — ABNORMAL HIGH (ref 65–99)
Potassium: 3.6 mmol/L (ref 3.5–5.1)
Sodium: 139 mmol/L (ref 135–145)

## 2015-08-28 MED ORDER — FUROSEMIDE 40 MG PO TABS
60.0000 mg | ORAL_TABLET | Freq: Every day | ORAL | Status: DC
  Administered 2015-08-28 – 2015-08-29 (×2): 60 mg via ORAL
  Filled 2015-08-28 (×2): qty 1

## 2015-08-28 MED ORDER — ACETAMINOPHEN 500 MG PO TABS
500.0000 mg | ORAL_TABLET | Freq: Four times a day (QID) | ORAL | Status: DC | PRN
  Administered 2015-08-28 – 2015-08-29 (×2): 500 mg via ORAL
  Filled 2015-08-28 (×2): qty 1

## 2015-08-28 NOTE — Progress Notes (Signed)
[facility] PROGRESS NOTE  [name] [id number] DOB: 1935-01-23 DOA: 08/24/2015 PCP: Dorothyann Peng, NP   80 y.o ?  Obesity,  DM2,  CKD3,  Nephrolithiasis,  h/o PE,  OSA on CPAP Presented with URI type of symptoms for past 1 week, she was Levaquin to 1 week prior to admission., presented to ER due to worsening, cough, congestion, shortness of breath, wheezing. In emergency department CT angiogram showed no pulmonary embolism there is some groundglass and nodular opacity. Initially concern for Bronchitis/pneumonia with reactive airway disease, now clinical picture more c/w CHF, improving with diuresis.  Assessment/Plan:  . Acute respiratory failure with hypoxia (HCC) -Initially ? bronchitis/ pneumonia with reactive airway disease,-->CHF. -change to PO levaquin for 32more days, cut down solumedrol and quick taper off steroids -FLu PCR negative -improving, see below, wean O2  Acute Diastolic CHF -CXr with interstitial edema on repeat CXR 12/19 -BNP only 151 but could be falsely low in Obesity, changed IV lasix to PO lasix 60 daily -negative 6.09 -ECHO with normal EF and wall motion and grade 2DD  .OSA -CPap QHS  . Essential hypertension  -held Losartan due to renal insufficiency  . Diabetes type 2, uncontrolled  -due to steroids -increased Insulin 70/30 to 80unit BID, SSI CBG's 95-134 -Hbaic was 8.4  . AKI on CKD 3 -baseline craetinine around 1.5-1.9 in last year -hold ARB, lasix x1 today -creatinine slightly higher than baseline in setting of CHF/diuresis -Renal US, without hydronephrosis, h/o multiple calculi and stents, last at [facility] 8/16    Morbid Obesity  Ambulate, Pt/OT  Prophylaxis: Lovenox   Code Status: Full Code Family Communication:called and LM with daughter 08/28/15 Disposition Plan: home in 24 hr   HPI/Subjective:  Breathing better Feels much better primarily with decrease in fluid in legs. Wants to walk around Is ambulatory at  home No cp  Objective: Filed Vitals:   08/28/15 0211 08/28/15 0411  BP: 146/73 148/55  Pulse: 68 53  Temp: 97.8 F (36.6 C) 97.7 F (36.5 C)  Resp: 18 18    Intake/Output Summary (Last 24 hours) at 08/28/15 1336 Last data filed at 08/28/15 1045  Gross per 24 hour  Intake      0 ml  Output   2400 ml  Net  -2400 ml   Filed Weights   08/24/15 2020  Weight: 107.4 kg (236 lb 12.4 oz)    Exam:   General:  AAOx3, no distress, obese  Cardiovascular: S1S2/RRR  Respiratory: clinically clear no added sound  Abdomen: soft, NT, BS present  Musculoskeletal trace edema c/c   Data Reviewed: Basic Metabolic Panel:  Recent Labs Lab 08/24/15 1326 08/24/15 2302 08/25/15 0505 08/26/15 0444 08/27/15 0444 08/28/15 0443  NA 139  --  138 135 136 139  K 3.9  --  4.5 4.6 4.3 3.6  CL 101  --  98* 96* 93* 91*  CO2  --   --  28 28 30  34*  GLUCOSE 231*  --  505* 338* 304* 134*  BUN 35*  --  43* 57* 60* 74*  CREATININE 1.70* 1.93* 2.15* 2.23* 2.21* 2.29*  CALCIUM  --   --  9.4 9.3 9.8 9.8  MG  --   --  2.2  --   --   --   PHOS  --   --  4.5  --   --   --    Liver Function Tests:  Recent Labs Lab 08/24/15 1316 08/25/15 0505  AST 28 27  ALT 18 19  ALKPHOS 39 42  BILITOT 0.6 0.6  PROT 7.9 8.2*  ALBUMIN 4.0 4.2   No results for input(s): LIPASE, AMYLASE in the last 168 hours. No results for input(s): AMMONIA in the last 168 hours. CBC:  Recent Labs Lab 08/24/15 1316  08/24/15 2302 08/25/15 0505 08/26/15 0444 08/27/15 0444 08/28/15 0443  WBC 5.3  --  5.8 7.9 8.5 9.1 9.2  NEUTROABS 3.9  --   --  7.1  --   --   --   HGB 10.6*  < > 10.3* 11.0* 10.1* 10.6* 11.2*  HCT 33.4*  < > 32.3* 34.8* 31.6* 32.0* 34.1*  MCV 92.0  --  93.9 93.8 92.1 89.9 90.2  PLT 105*  --  116* 143* 135* 155 178  < > = values in this interval not displayed. Cardiac Enzymes:  Recent Labs Lab 08/24/15 2302 08/25/15 0505 08/25/15 1011  TROPONINI 0.03 0.03 0.03   BNP (last 3  results)  Recent Labs  08/24/15 1316 08/26/15 0444  BNP 60.9 151.4*    ProBNP (last 3 results) No results for input(s): PROBNP in the last 8760 hours.  CBG:  Recent Labs Lab 08/27/15 1148 08/27/15 1635 08/27/15 2115 08/28/15 0732 08/28/15 1146  GLUCAP 344* 239* 284* 117* 95    Recent Results (from the past 240 hour(s))  Urine culture     Status: None (Preliminary result)   Collection Time: 08/24/15 11:53 PM  Result Value Ref Range Status   Specimen Description URINE, RANDOM  Final   Special Requests NONE  Final   Culture   Final    80,000 COLONIES/ml METHICILLIN RESISTANT STAPHYLOCOCCUS AUREUS 40,000 COLONIES/ml GRAM POSITIVE COCCI Performed at [facility]    Report Status PENDING  Incomplete   Organism ID, Bacteria METHICILLIN RESISTANT STAPHYLOCOCCUS AUREUS  Final      Susceptibility   Methicillin resistant staphylococcus aureus - MIC*    CIPROFLOXACIN >=8 RESISTANT Resistant     GENTAMICIN <=0.5 SENSITIVE Sensitive     NITROFURANTOIN 32 SENSITIVE Sensitive     OXACILLIN >=4 RESISTANT Resistant     TETRACYCLINE <=1 SENSITIVE Sensitive     VANCOMYCIN <=0.5 SENSITIVE Sensitive     TRIMETH/SULFA <=10 SENSITIVE Sensitive     CLINDAMYCIN <=0.25 RESISTANT Resistant     RIFAMPIN <=0.5 SENSITIVE Sensitive     Inducible Clindamycin POSITIVE Resistant     * 80,000 COLONIES/ml METHICILLIN RESISTANT STAPHYLOCOCCUS AUREUS     Studies: US Renal  08/26/2015  CLINICAL DATA:  80 year old female with acute renal injury. Initial encounter. EXAM: RENAL / URINARY TRACT ULTRASOUND COMPLETE COMPARISON:  CT Abdomen and Pelvis 10/30/2008 FINDINGS: Right Kidney: Length: 10.8 cm. Renal cortical volume loss appears congruent with age. Echogenicity within normal limits. No mass or hydronephrosis visualized. Left Kidney: Length: 11.4 cm. Cortical volume loss appears greater than that on the right side (image 15). Questionable nonobstructing midpole nephrolithiasis on image 19.  Small simple appearing exophytic lower pole cyst (image 21). Cortical echogenicity within normal limits. No hydronephrosis or left renal mass. Bladder: Appears normal for degree of bladder distention. Other findings: Hepatic steatosis (image 3). IMPRESSION: 1. No hydronephrosis or acute renal findings. Negative urinary bladder. 2. Left greater than right renal cortical volume loss. Questionable nonobstructing left renal midpole calculus. 3. Hepatic steatosis. Electronically Signed   By: Genevie Ann M.D.   On: 08/26/2015 15:23    Scheduled Meds: . atorvastatin  20 mg Oral Daily  . enoxaparin (LOVENOX) injection  30 mg Subcutaneous Q24H  .  famotidine  10 mg Oral Daily  . fenofibrate  54 mg Oral Daily  . furosemide  60 mg Oral Daily  . insulin aspart  0-15 Units Subcutaneous TID WC  . insulin aspart  0-5 Units Subcutaneous QHS  . insulin aspart protamine- aspart  85 Units Subcutaneous BID WC  . ipratropium-albuterol  3 mL Nebulization TID  . levothyroxine  112 mcg Oral QAC breakfast  . oxybutynin  5 mg Oral Daily  . sertraline  50 mg Oral Daily  . tamsulosin  0.4 mg Oral Daily   Continuous Infusions:  Antibiotics Given (last 72 hours)    Date/Time Action Medication Dose Rate   08/25/15 1540 Given   azithromycin (ZITHROMAX) 500 mg in dextrose 5 % 250 mL IVPB 500 mg 250 mL/hr   08/25/15 1657 Given   cefTRIAXone (ROCEPHIN) 1 g in dextrose 5 % 50 mL IVPB 1 g 100 mL/hr   08/26/15 1625 Given   cefTRIAXone (ROCEPHIN) 1 g in dextrose 5 % 50 mL IVPB 1 g 100 mL/hr   08/26/15 1716 Given   azithromycin (ZITHROMAX) 500 mg in dextrose 5 % 250 mL IVPB 500 mg 250 mL/hr   08/27/15 1219 Given   levofloxacin (LEVAQUIN) tablet 250 mg 250 mg    08/28/15 1034 Given   levofloxacin (LEVAQUIN) tablet 250 mg 250 mg       Active Problems:   Diabetes type 2, uncontrolled ([place])   Essential hypertension   Sleep apnea   Hypoxia   Bronchitis   Acute respiratory failure with hypoxia (HCC)   Chronic kidney  disease    Time spent: 23min  Verneita Griffes, MD [facility] (P) [phone]

## 2015-08-28 NOTE — Evaluation (Signed)
Occupational Therapy Evaluation Patient Details Name: DARRYN BROMBERG MRN: [id number] DOB: 08-31-1935 Today's Date: 08/28/2015    History of Present Illness 80 y.o. female with history of Obesity, DM2, CKD3, Nephrolithiasis, h/o PE, OSA on CPAP and admitted for acute respiratory failure with hypoxia and CHF   Clinical Impression   Pt admitted with acute respiratory failure. Pt currently with functional limitations due to the deficits listed below (see OT Problem List).  Pt will benefit from skilled OT to increase their safety and independence with ADL and functional mobility for ADL to facilitate discharge to venue listed below.     Follow Up Recommendations  Home health OT    Equipment Recommendations  None recommended by OT;3 in 1 bedside comode       Precautions / Restrictions Precautions Precautions: None Precaution Comments: monitor sats      Mobility Bed Mobility               General bed mobility comments: pt up in recliner  Transfers Overall transfer level: Needs assistance Equipment used: Rolling walker (2 wheeled) Transfers: Sit to/from Stand Sit to Stand: Min guard;Supervision                   ADL Overall ADL's : Needs assistance/impaired     Grooming: Set up;Sitting   Upper Body Bathing: Set up;Sitting   Lower Body Bathing: Moderate assistance;Sit to/from stand   Upper Body Dressing : Set up;Sitting   Lower Body Dressing: Moderate assistance;Sit to/from stand   Toilet Transfer: Minimal assistance;Cueing for sequencing;Cueing for safety;RW   Toileting- Clothing Manipulation and Hygiene: Minimal assistance;Sit to/from stand         General ADL Comments: pt will benefit from further energy conservation strategies as well as safety with ADL activity               Pertinent Vitals/Pain Pain Assessment: No/denies pain        Extremity/Trunk Assessment Upper Extremity Assessment Upper Extremity Assessment: Generalized  weakness   Lower Extremity Assessment Lower Extremity Assessment: Generalized weakness       Communication Communication Communication: No difficulties   Cognition Arousal/Alertness: Awake/alert Behavior During Therapy: WFL for tasks assessed/performed Overall Cognitive Status: Within Functional Limits for tasks assessed                                Home Living Family/patient expects to be discharged to:: Private residence Living Arrangements: Alone Available Help at Discharge: Personal care attendant (at night) Type of Home: House       Home Layout: One level     Bathroom Shower/Tub: Occupational psychologist: Standard     Home Equipment: Environmental consultant - 2 wheels;Cane - single point          Prior Functioning/Environment Level of Independence: Independent with assistive device(s)             OT Diagnosis: Generalized weakness   OT Problem List: Decreased strength;Decreased activity tolerance   OT Treatment/Interventions: Self-care/ADL training;DME and/or AE instruction;Patient/family education;Energy conservation    OT Goals(Current goals can be found in the care plan section) Acute Rehab OT Goals Patient Stated Goal: get back to OP Time For Goal Achievement: 09/11/15  OT Frequency: Min 2X/week              End of Session Equipment Utilized During Treatment: Surveyor, mining Communication: Mobility status  Activity Tolerance: Patient tolerated treatment  well Patient left: in chair;with call bell/phone within reach   Time: [id number] OT Time Calculation (min): 14 min Charges:  OT General Charges $OT Visit: 1 Procedure OT Evaluation $Initial OT Evaluation Tier I: 1 Procedure G-Codes:    Betsy Pries Aug 30, 2015, 1:43 PM

## 2015-08-28 NOTE — Evaluation (Signed)
Physical Therapy Evaluation Patient Details Name: [name] MRN: [id number] DOB: 05-16-1935 Today's Date: 08/28/2015   History of Present Illness  80 y.o. female with history of Obesity, DM2, CKD3, Nephrolithiasis, h/o PE, OSA on CPAP and admitted for acute respiratory failure with hypoxia and CHF  Clinical Impression  Pt admitted with above diagnosis. Pt currently with functional limitations due to the deficits listed below (see PT Problem List).  Pt will benefit from skilled PT to increase their independence and safety with mobility to allow discharge to the venue listed below.   Pt reports she always uses RW or SPC since her TKA due to feeling unsteady.  Pt states she has been going to Outpatient PT and feels as though she has been improving.  Recommend pt return outpatient PT.  Pt did require supplemental oxygen during gait.  SATURATION QUALIFICATIONS: (This note is used to comply with regulatory documentation for home oxygen)  Patient Saturations on Room Air at Rest = 89%  Patient Saturations on Room Air while Ambulating = 85%  Patient Saturations on 2 Liters of oxygen while Ambulating = 97%   Pt back on 1L O2 upon returning to room after ambulation = 93%  Please briefly explain why patient needs home oxygen: to maintain saturations above 88% during physical activity such as ambulation.    Follow Up Recommendations Outpatient PT (has been going to outpatient PT at [facility])    Equipment Recommendations  None recommended by PT    Recommendations for Other Services       Precautions / Restrictions Precautions Precautions: None Precaution Comments: monitor sats      Mobility  Bed Mobility               General bed mobility comments: pt up in recliner  Transfers Overall transfer level: Needs assistance Equipment used: Rolling walker (2 wheeled) Transfers: Sit to/from Stand Sit to Stand: Min guard;Supervision             Ambulation/Gait Ambulation/Gait assistance: Min guard Ambulation Distance (Feet): 200 Feet Assistive device: Rolling walker (2 wheeled) Gait Pattern/deviations: Step-through pattern;Decreased stride length     General Gait Details: pt required supplemental oxygen during gait due to SPO2 85% on room air, encouraged 2 standing rest breaks due to fatigue  Stairs            Wheelchair Mobility    Modified Rankin (Stroke Patients Only)       Balance                                             Pertinent Vitals/Pain Pain Assessment: No/denies pain    Home Living Family/patient expects to be discharged to:: Private residence Living Arrangements: Alone Available Help at Discharge: Personal care attendant (at night) Type of Home: House       Home Layout: One level Home Equipment: Environmental consultant - 2 wheels;Cane - single point      Prior Function Level of Independence: Independent with assistive device(s)               Hand Dominance        Extremity/Trunk Assessment               Lower Extremity Assessment: Generalized weakness         Communication   Communication: No difficulties  Cognition Arousal/Alertness: Awake/alert Behavior During Therapy: WFL for tasks  assessed/performed Overall Cognitive Status: Within Functional Limits for tasks assessed                      General Comments      Exercises        Assessment/Plan    PT Assessment Patient needs continued PT services  PT Diagnosis Difficulty walking   PT Problem List Decreased strength;Decreased activity tolerance;Decreased mobility;Cardiopulmonary status limiting activity  PT Treatment Interventions DME instruction;Gait training;Patient/family education;Functional mobility training;Therapeutic activities;Therapeutic exercise   PT Goals (Current goals can be found in the Care Plan section) Acute Rehab PT Goals PT Goal Formulation: With patient Time For  Goal Achievement: 09/04/15 Potential to Achieve Goals: Good    Frequency Min 3X/week   Barriers to discharge        Co-evaluation               End of Session Equipment Utilized During Treatment: Oxygen Activity Tolerance: Patient tolerated treatment well Patient left: in chair;with call bell/phone within reach Nurse Communication: Mobility status         Time: 1035-1056 PT Time Calculation (min) (ACUTE ONLY): 21 min   Charges:   PT Evaluation $Initial PT Evaluation Tier I: 1 Procedure     PT G Codes:        Mehmet Scally,KATHrine E 08/28/2015, 12:08 PM Carmelia Bake, PT, DPT 08/28/2015 Pager: [phone]

## 2015-08-28 NOTE — Progress Notes (Signed)
Weaned patient from oxygen.  O2 sat on RA 97%.

## 2015-08-28 NOTE — Progress Notes (Signed)
Nutrition Education Note  RD consulted for nutrition education regarding new onset CHF.  RD provided "Low Sodium Nutrition Therapy" handout from the [employer]. Reviewed patient's dietary recall. Provided examples on ways to decrease sodium intake in diet. Discouraged intake of processed foods and use of salt shaker. Encouraged fresh fruits and vegetables as well as whole grain sources of carbohydrates to maximize fiber intake.   RD discussed why it is important for patient to adhere to diet recommendations, and emphasized the role of fluids, foods to avoid, and importance of weighing self daily. Teach back method used.  Pt states that she has had ongoing issues with kidney stones due to uric acid and has a dietitian who visits her at home to assist with meals related to this issue. She states she also has an individual who cooks meals for her from scratch as she has been advised in the past not to consume processed foods. She eats poultry but the majority of her meals consist of fruits and vegetables. Pt's daughter provides her with Mrs. Dash but the pt does sometimes use sea salt to season her foods. Talked with pt about the need to limit salt intake when possible and to be sparing when seasoning.   Expect good compliance.  Body mass index is 43.3 kg/(m^2). Pt meets criteria for morbid obesity based on current BMI.  Current diet order is Carb Modified, Low Sodium, patient is consuming approximately 50-100% of meals at this time. Labs and medications reviewed. No further nutrition interventions warranted at this time. RD contact information provided. If additional nutrition issues arise, please re-consult RD.      Jarome Matin, RD, LDN Inpatient Clinical Dietitian Pager # [phone] After hours/weekend pager # [phone]

## 2015-08-28 NOTE — Progress Notes (Signed)
While working with PT, patient oxygen saturation on RA dropped to 85% while ambulating in the hall.  Oxygen was applied at 1 L/M [place] and O2 sat increased to 93% per PT.  Will continue to monitor oxygen saturation.

## 2015-08-29 LAB — COMPREHENSIVE METABOLIC PANEL
ALT: 25 U/L (ref 14–54)
AST: 37 U/L (ref 15–41)
Albumin: 4.2 g/dL (ref 3.5–5.0)
Alkaline Phosphatase: 36 U/L — ABNORMAL LOW (ref 38–126)
Anion gap: 14 (ref 5–15)
BUN: 87 mg/dL — ABNORMAL HIGH (ref 6–20)
CO2: 33 mmol/L — ABNORMAL HIGH (ref 22–32)
Calcium: 9.4 mg/dL (ref 8.9–10.3)
Chloride: 94 mmol/L — ABNORMAL LOW (ref 101–111)
Creatinine, Ser: 2.87 mg/dL — ABNORMAL HIGH (ref 0.44–1.00)
GFR calc Af Amer: 17 mL/min — ABNORMAL LOW (ref >60)
GFR calc non Af Amer: 15 mL/min — ABNORMAL LOW (ref >60)
Glucose, Bld: 101 mg/dL — ABNORMAL HIGH (ref 65–99)
Potassium: 3.1 mmol/L — ABNORMAL LOW (ref 3.5–5.1)
Sodium: 141 mmol/L (ref 135–145)
Total Bilirubin: 0.6 mg/dL (ref 0.3–1.2)
Total Protein: 7.7 g/dL (ref 6.5–8.1)

## 2015-08-29 LAB — URINE CULTURE: Culture: 80000

## 2015-08-29 LAB — GLUCOSE, CAPILLARY
Glucose-Capillary: 80 mg/dL (ref 65–99)
Glucose-Capillary: 89 mg/dL (ref 65–99)

## 2015-08-29 MED ORDER — POTASSIUM CHLORIDE CRYS ER 20 MEQ PO TBCR
40.0000 meq | EXTENDED_RELEASE_TABLET | Freq: Every day | ORAL | Status: DC
  Administered 2015-08-29: 40 meq via ORAL
  Filled 2015-08-29: qty 2

## 2015-08-29 MED ORDER — FUROSEMIDE 20 MG PO TABS: 40.0000 mg | ORAL_TABLET | Freq: Every day | ORAL | Status: DC

## 2015-08-29 MED ORDER — POTASSIUM CHLORIDE CRYS ER 20 MEQ PO TBCR: 40.0000 meq | EXTENDED_RELEASE_TABLET | Freq: Every day | ORAL | Status: DC

## 2015-08-29 NOTE — Progress Notes (Signed)
Occupational Therapy Treatment Patient Details Name: [name] MRN: [id number] DOB: 11-25-1934 Today's Date: 08/29/2015    History of present illness 80 y.o. female with history of Obesity, DM2, CKD3, Nephrolithiasis, h/o PE, OSA on CPAP and admitted for acute respiratory failure with hypoxia and CHF   OT comments  Pt states family will A over holidays and help her establish A via her long term care policy she intends on calling  Follow Up Recommendations  Home health OT    Equipment Recommendations  None recommended by OT;3 in 1 bedside comode    Recommendations for Other Services      Precautions / Restrictions Precautions Precautions: None Precaution Comments: monitor sats Restrictions Weight Bearing Restrictions: No       Mobility Bed Mobility           pt in chair         Transfers    s- min guard with VC for hand placement                       ADL Overall ADL's : Needs assistance/impaired                         Toilet Transfer: Supervision/safety;Ambulation   Toileting- Clothing Manipulation and Hygiene: Supervision/safety;Sit to/from stand       Functional mobility during ADLs: Min guard General ADL Comments: OT provided education regarding energy conservation strategie. Hand out provided. Pts plans to call her long term care policy to start helping her.  Pt overall S- min A with ADL activity and will benefit from [facility] to ensure safety and I at home                Cognition   Behavior During Therapy: [facility] for tasks assessed/performed Overall Cognitive Status: Within Functional Limits for tasks assessed                               General Comments  sats 97 with sitting and 96 with standing and sit to stand. RN notified    Pertinent Vitals/ Pain       Pain Assessment: No/denies pain     Prior Functioning/Environment              Frequency Min 2X/week     Progress Toward Goals  OT  Goals(current goals can now be found in the care plan section)  Progress towards OT goals: Progressing toward goals     Plan         End of Session Equipment Utilized During Treatment: Rolling walker   Activity Tolerance Patient tolerated treatment well   Patient Left in chair;with call bell/phone within reach   Nurse Communication Mobility status        Time: [id number] OT Time Calculation (min): 17 min  Charges: OT General Charges $OT Visit: 1 Procedure OT Treatments $Self Care/Home Management : 8-22 mins  [name] D 08/29/2015, 11:40 AM

## 2015-08-29 NOTE — Progress Notes (Signed)
Reviewed discharge information with patient and caregiver. Answered all questions. Patient/caregiver able to teach back medications and reasons to contact MD/911. Patient verbalizes importance of PCP follow up appointment. Patient able to teach back heart failure info and verbalizes to call physician for appropriate weight gain  Barbee Shropshire. Brigitte Pulse, RN

## 2015-08-29 NOTE — Discharge Summary (Addendum)
Physician Discharge Summary  [name] [id number] DOB: 1934/11/27 DOA: 08/24/2015  PCP: Dorothyann Peng, NP  Admit date: 08/24/2015 Discharge date: 08/29/2015  Time spent: 40 minutes  Recommendations for Outpatient Follow-up:  1. Need Bmet in 1 week 2. Lasix 40 mg daily and potassium rx on d/c-please monitor 3. Will d/c with Home health care as well as oxygen-oxygen needs and qualifications to be reconsidered by PCP 4. Will get RN to come out and assess how patient is managing  Discharge Diagnoses:  Active Problems:   Diabetes type 2, uncontrolled ([place])   Essential hypertension   Sleep apnea   Hypoxia   Bronchitis   Acute respiratory failure with hypoxia ([facility])   Chronic kidney disease   Discharge Condition: fair  Diet recommendation: heart healthy low salt fluid restrict to 1500 cc  Filed Weights   08/24/15 2020  Weight: 107.4 kg (236 lb 12.4 oz)    History of present illness:  80 y.o ? Obesity,  DM2,  CKD3,  Nephrolithiasis, h/o PE,  OSA on CPAP Presented with URI type of symptoms for past 1 week, she was Levaquin to 1 week prior to admission., presented to ER due to worsening, cough, congestion, shortness of breath, wheezing. In emergency department CT angiogram showed no pulmonary embolism there is some groundglass and nodular opacity. Initially concern for Bronchitis/pneumonia with reactive airway disease, now clinical picture more c/w CHF, improving with diuresis.  Hospital Course:   Acute respiratory failure with hypoxia ([place]) -Initially ? bronchitis/ pneumonia with reactive airway disease,-->CHF. -change to PO levaquin and completed 5 days empiric therapy cut down solumedrol and quick taper off steroids -FLu PCR negative -improving, see below, wean O2  Acute Diastolic CHF -CXr with interstitial edema on repeat CXR 12/19 -BNP only 151 but could be falsely low in Obesity, changed IV lasix to PO lasix 60 daily-->40 mg on d/c home -negative 8  liters by time of discharge -d/c weight was not recorded but admission weight was 23 6 -ECHO with normal EF and wall motion and grade 2DD  .OSA -CPap QHS  . Essential hypertension  -held Losartan due to renal insufficiency  . Diabetes type 2, uncontrolled  -due to steroids -increased Insulin 70/30 to 80unit BID, SSI CBG's 95-134 -Hbaic was 8.4  . AKI on CKD 3 -baseline craetinine around 1.5-1.9 in last year -hold ARB, lasix x1 today -creatinine slightly higher than baseline in setting of CHF/diuresis -Renal US, without hydronephrosis, h/o multiple calculi and stents, last at [facility] 8/16   Morbid Obesity  Asymptomatic bacteriuria Multiple different colonies grew on UC Should be covered by Levaquin Rx Asymptomatic and wouldn't consider further Rx unless symptomatic  Procedures:  Echo EF 60-65% with grade 2 DD  Consultations:  none  Discharge Exam: Filed Vitals:   08/28/15 2109 08/29/15 0517  BP: 136/47 128/57  Pulse: 72 68  Temp: 97.7 F (36.5 C) 97.9 F (36.6 C)  Resp: 18 18    General: alert pleasant oriented in nad Cardiovascular: s1 s 2 no m/r/g Respiratory: clear no added sound  Discharge Instructions   Discharge Instructions    Diet - low sodium heart healthy    Complete by:  As directed      Discharge instructions    Complete by:  As directed   Your shortness of breath was probably due to previously undiagnosed Heart failure-Your echocardiogram confirmed this.  The treatment of heart failure is fluid restriction and lasix a medicine to decrease your fluid levels This unfortunately needs  close monitoring and your kidney function has declined and so we will have to watch your kidney function closely as an outpatient Please get labs on follow up in 1 week YOu have completed antibiotics for a presumed Pneumonia and do not require any further Rx You will notice we have stppped some of your blood prssure medicaitons like [place]-Please do not take this  as this could affect your kidneys     Increase activity slowly    Complete by:  As directed           Current Discharge Medication List    START taking these medications   Details  furosemide (LASIX) 20 MG tablet Take 2 tablets (40 mg total) by mouth daily. Qty: 90 tablet, Refills: 0    potassium chloride SA (K-DUR,KLOR-CON) 20 MEQ tablet Take 2 tablets (40 mEq total) by mouth daily. Qty: 60 tablet, Refills: 0      CONTINUE these medications which have NOT CHANGED   Details  atorvastatin (LIPITOR) 20 MG tablet Take 1 tablet (20 mg total) by mouth daily. Qty: 90 tablet, Refills: 3    Coenzyme Q10 200 MG capsule Take 200 mg by mouth daily.      dextromethorphan (DELSYM) 30 MG/5ML liquid Take 30 mg by mouth 2 (two) times daily as needed for cough.    fenofibrate 54 MG tablet Take 1 tablet (54 mg total) by mouth daily. Qty: 30 tablet, Refills: 6    fexofenadine (ALLEGRA) 180 MG tablet Take 180 mg by mouth daily.      levofloxacin (LEVAQUIN) 500 MG tablet Take 500 mg by mouth daily. Refills: 0    levothyroxine (SYNTHROID, LEVOTHROID) 112 MCG tablet TK 1 T PO D Refills: 2    Multiple Vitamin (MULTIVITAMIN) tablet Take 1 tablet by mouth daily.      oxybutynin (DITROPAN) 5 MG tablet Take 5 mg by mouth daily.     oxyCODONE (OXY IR/ROXICODONE) 5 MG immediate release tablet Take 5 mg by mouth every 4 (four) hours as needed for severe pain.    Probiotic Product (PROBIOTIC PO) Take 1 capsule by mouth daily.    ranitidine (ZANTAC) 150 MG capsule Take 150 mg by mouth 2 (two) times daily as needed for heartburn.     sertraline (ZOLOFT) 50 MG tablet Take 50 mg by mouth daily.    tamsulosin (FLOMAX) 0.4 MG CAPS capsule Take 0.4 mg by mouth daily. Refills: 3    tiZANidine (ZANAFLEX) 4 MG tablet Take 1 tablet (4 mg total) by mouth every 6 (six) hours as needed for muscle spasms. Qty: 30 tablet, Refills: 0    HUMULIN 70/30 (70-30) 100 UNIT/ML injection INJECT 65 UNITS UNDER THE  SKIN AS DIRECTED EVERY MORNING AND 30 UNITS EVERY EVENING Qty: 30 mL, Refills: 3    Insulin Pen Needle (B-D UF III MINI PEN NEEDLES) 31G X 5 MM MISC Use daily for insulin injection Qty: 100 each, Refills: 2   Associated Diagnoses: Type II or unspecified type diabetes mellitus without mention of complication, uncontrolled      STOP taking these medications     dextromethorphan-guaiFENesin (MUCINEX DM) 30-600 MG 12hr tablet      losartan (COZAAR) 25 MG tablet      potassium citrate (UROCIT-K) 10 MEQ (1080 MG) SR tablet        Allergies  Allergen Reactions  . Aspirin   . Contrast Media [Iodinated Diagnostic Agents]   . Flagyl [Metronidazole]   . Fluticasone-Salmeterol   . Lactose Intolerance (Gi)   .  Latex   . Lisinopril     REACTION: cough  . Metformin     REACTION: nausea  . Pineapple Swelling    Throat swells and blisters on tongue and roof of mouth per patient  . Sulfonamide Derivatives   . Tetracycline       The results of significant diagnostics from this hospitalization (including imaging, microbiology, ancillary and laboratory) are listed below for reference.    Significant Diagnostic Studies: Dg Chest 2 View  08/26/2015  CLINICAL DATA:  Cough. EXAM: CHEST  2 VIEW COMPARISON:  08/24/2015 . FINDINGS: Cardiomegaly with pulmonary vascular prominence and bilateral interstitial prominence noted consistent with congestive heart failure. Interim improvement from prior exam. No pleural effusion or pneumothorax. No acute bony abnormality. IMPRESSION: Congestive heart failure with bilateral pulmonary interstitial prominence consistent with congestive heart failure. Interim improvement from 08/24/2015 . Electronically Signed   By: Marcello Moores  Register   On: 08/26/2015 08:17   Ct Angio Chest Pe W/cm &/or Wo Cm  08/24/2015  CLINICAL DATA:  80 year old female with a history of potential pulmonary embolism. Shortness of breath. EXAM: CT ANGIOGRAPHY CHEST WITH CONTRAST TECHNIQUE:  Multidetector CT imaging of the chest was performed using the standard protocol during bolus administration of intravenous contrast. Multiplanar CT image reconstructions and MIPs were obtained to evaluate the vascular anatomy. CONTRAST:  8mL OMNIPAQUE IOHEXOL 350 MG/ML SOLN COMPARISON:  Multiple prior chest x-ray, comparison CT chest 06/08/2010, 03/22/2009 FINDINGS: Chest: Unremarkable appearance of the chest superficial soft tissues. No axillary or supraclavicular adenopathy. Heterogeneous soft tissue nodule with well-defined borders within the right tracheoesophageal groove is unchanged in size over the course of 2 prior CT studies. Greatest diameter measures 2.5 cm. Several mediastinal lymph nodes, none of which are enlarged by CT size criteria or have suspicious features. Unremarkable appearance of the esophagus. Unremarkable course caliber and contour of the thoracic aorta without dissection flap, aneurysm, periaortic fluid. Minimal calcifications of the aortic arch and of the proximal branch vessels. Branch vessels remain patent. No central, lobar, or segmental filling defects. Evaluation of the subsegmental branches limited by timing of the contrast bolus and motion artifact. Heart size within normal limits without pericardial fluid/ thickening. No confluent airspace disease. Bronchi wall thickening, particularly of the lower lobes. Pattern of mixed ground-glass and nodular opacities in a peribronchovascular distribution, predominantly of the lower lobes. Nodular opacity in the dependent aspects of the lower lobes. No pleural effusion. Upper abdomen: Unremarkable appearance of the visualized upper abdomen. Musculoskeletal: No displaced fracture. Degenerative changes of the visualized thoracic spine vacuum disc phenomenon at the T9-T10 level. Review of the MIP images confirms the above findings. IMPRESSION: Study is negative for pulmonary emboli. Mild bronchial wall thickening with associated pattern of  ground-glass and nodular opacity in a peribronchovascular distribution of the lower lobes, potentially representing acute or chronic bronchitis. Correlation with lab values and patient history may be useful. No evidence of lobar pneumonia. Atherosclerosis. Unchanged size of right-sided tracheal esophageal soft tissue nodule, similar dating to the CT of 03/22/2009. Signed, Dulcy Fanny. Earleen Newport, DO [facility] Electronically Signed   By: Corrie Mckusick D.O.   On: 08/24/2015 17:16   US Renal  08/26/2015  CLINICAL DATA:  80 year old female with acute renal injury. Initial encounter. EXAM: RENAL / URINARY TRACT ULTRASOUND COMPLETE COMPARISON:  CT Abdomen and Pelvis 10/30/2008 FINDINGS: Right Kidney: Length: 10.8 cm. Renal cortical volume loss appears congruent with age. Echogenicity within normal limits. No mass or hydronephrosis visualized. Left Kidney:  Length: 11.4 cm. Cortical volume loss appears greater than that on the right side (image 15). Questionable nonobstructing midpole nephrolithiasis on image 19. Small simple appearing exophytic lower pole cyst (image 21). Cortical echogenicity within normal limits. No hydronephrosis or left renal mass. Bladder: Appears normal for degree of bladder distention. Other findings: Hepatic steatosis (image 3). IMPRESSION: 1. No hydronephrosis or acute renal findings. Negative urinary bladder. 2. Left greater than right renal cortical volume loss. Questionable nonobstructing left renal midpole calculus. 3. Hepatic steatosis. Electronically Signed   By: Genevie Ann M.D.   On: 08/26/2015 15:23   Dg Chest Port 1 View  08/24/2015  CLINICAL DATA:  Patient with shortness of breath. Possible pulmonary embolism. EXAM: PORTABLE CHEST 1 VIEW COMPARISON:  Chest radiograph 09/22/2010. FINDINGS: Patient is rotated. Monitoring leads overlie the patient. Stable cardiomegaly. Pulmonary vascular redistribution and bilateral interstitial  pulmonary opacities. No aggressive or acute appearing osseous lesions. IMPRESSION: Cardiomegaly. Pulmonary redistribution and interstitial opacities may represent pulmonary edema. Atypical infectious process is not excluded. Electronically Signed   By: Lovey Newcomer M.D.   On: 08/24/2015 13:57    Microbiology: Recent Results (from the past 240 hour(s))  Urine culture     Status: None (Preliminary result)   Collection Time: 08/24/15 11:53 PM  Result Value Ref Range Status   Specimen Description URINE, RANDOM  Final   Special Requests NONE  Final   Culture   Final    80,000 COLONIES/ml METHICILLIN RESISTANT STAPHYLOCOCCUS AUREUS 40,000 COLONIES/ml ENTEROCOCCUS SPECIES Performed at [facility]    Report Status PENDING  Incomplete   Organism ID, Bacteria METHICILLIN RESISTANT STAPHYLOCOCCUS AUREUS  Final      Susceptibility   Methicillin resistant staphylococcus aureus - MIC*    CIPROFLOXACIN >=8 RESISTANT Resistant     GENTAMICIN <=0.5 SENSITIVE Sensitive     NITROFURANTOIN 32 SENSITIVE Sensitive     OXACILLIN >=4 RESISTANT Resistant     TETRACYCLINE <=1 SENSITIVE Sensitive     VANCOMYCIN <=0.5 SENSITIVE Sensitive     TRIMETH/SULFA <=10 SENSITIVE Sensitive     CLINDAMYCIN <=0.25 RESISTANT Resistant     RIFAMPIN <=0.5 SENSITIVE Sensitive     Inducible Clindamycin POSITIVE Resistant     * 80,000 COLONIES/ml METHICILLIN RESISTANT STAPHYLOCOCCUS AUREUS     Labs: Basic Metabolic Panel:  Recent Labs Lab 08/25/15 0505 08/26/15 0444 08/27/15 0444 08/28/15 0443 08/29/15 0430  NA 138 135 136 139 141  K 4.5 4.6 4.3 3.6 3.1*  CL 98* 96* 93* 91* 94*  CO2 28 28 30  34* 33*  GLUCOSE 505* 338* 304* 134* 101*  BUN 43* 57* 60* 74* 87*  CREATININE 2.15* 2.23* 2.21* 2.29* 2.87*  CALCIUM 9.4 9.3 9.8 9.8 9.4  MG 2.2  --   --   --   --   PHOS 4.5  --   --   --   --    Liver Function Tests:  Recent Labs Lab 08/24/15 1316 08/25/15 0505 08/29/15 0430  AST 28 27 37  ALT 18 19 25    ALKPHOS 39 42 36*  BILITOT 0.6 0.6 0.6  PROT 7.9 8.2* 7.7  ALBUMIN 4.0 4.2 4.2   No results for input(s): LIPASE, AMYLASE in the last 168 hours. No results for input(s): AMMONIA in the last 168 hours. CBC:  Recent Labs Lab 08/24/15 1316  08/24/15 2302 08/25/15 0505 08/26/15 0444 08/27/15 0444 08/28/15 0443  WBC 5.3  --  5.8 7.9 8.5 9.1 9.2  NEUTROABS 3.9  --   --  7.1  --   --   --   HGB 10.6*  < > 10.3* 11.0* 10.1* 10.6* 11.2*  HCT 33.4*  < > 32.3* 34.8* 31.6* 32.0* 34.1*  MCV 92.0  --  93.9 93.8 92.1 89.9 90.2  PLT 105*  --  116* 143* 135* 155 178  < > = values in this interval not displayed. Cardiac Enzymes:  Recent Labs Lab 08/24/15 2302 08/25/15 0505 08/25/15 1011  TROPONINI 0.03 0.03 0.03   BNP: BNP (last 3 results)  Recent Labs  08/24/15 1316 08/26/15 0444  BNP 60.9 151.4*    ProBNP (last 3 results) No results for input(s): PROBNP in the last 8760 hours.  CBG:  Recent Labs Lab 08/27/15 2115 08/28/15 0732 08/28/15 1146 08/28/15 1637 08/28/15 2102  GLUCAP 284* 117* 95 203* 169*       Signed:  Nita Sells MD  FACP  [facility] 08/29/2015, 8:12 AM

## 2015-08-29 NOTE — Progress Notes (Signed)
Spoke with pt concerning HH. Pt selected [facility], referral given to in house rep.  

## 2015-08-29 NOTE — Plan of Care (Signed)
Problem: Activity: Goal: Risk for activity intolerance will decrease Outcome: Progressing Encouraged patient to take resting periods during ambulation to prevent SOB exacerbation and risks of falls or injuries.

## 2015-09-03 DIAGNOSIS — M419 Scoliosis, unspecified: Secondary | ICD-10-CM | POA: Diagnosis not present

## 2015-09-03 DIAGNOSIS — Z86711 Personal history of pulmonary embolism: Secondary | ICD-10-CM | POA: Diagnosis not present

## 2015-09-03 DIAGNOSIS — I5030 Unspecified diastolic (congestive) heart failure: Secondary | ICD-10-CM | POA: Diagnosis not present

## 2015-09-03 DIAGNOSIS — F411 Generalized anxiety disorder: Secondary | ICD-10-CM | POA: Diagnosis not present

## 2015-09-03 DIAGNOSIS — E039 Hypothyroidism, unspecified: Secondary | ICD-10-CM | POA: Diagnosis not present

## 2015-09-03 DIAGNOSIS — G4733 Obstructive sleep apnea (adult) (pediatric): Secondary | ICD-10-CM | POA: Diagnosis not present

## 2015-09-03 DIAGNOSIS — E7800 Pure hypercholesterolemia, unspecified: Secondary | ICD-10-CM | POA: Diagnosis not present

## 2015-09-03 DIAGNOSIS — J45909 Unspecified asthma, uncomplicated: Secondary | ICD-10-CM | POA: Diagnosis not present

## 2015-09-03 DIAGNOSIS — F329 Major depressive disorder, single episode, unspecified: Secondary | ICD-10-CM | POA: Diagnosis not present

## 2015-09-03 DIAGNOSIS — Z9981 Dependence on supplemental oxygen: Secondary | ICD-10-CM | POA: Diagnosis not present

## 2015-09-03 DIAGNOSIS — N183 Chronic kidney disease, stage 3 (moderate): Secondary | ICD-10-CM | POA: Diagnosis not present

## 2015-09-03 DIAGNOSIS — I130 Hypertensive heart and chronic kidney disease with heart failure and stage 1 through stage 4 chronic kidney disease, or unspecified chronic kidney disease: Secondary | ICD-10-CM | POA: Diagnosis not present

## 2015-09-03 DIAGNOSIS — E1122 Type 2 diabetes mellitus with diabetic chronic kidney disease: Secondary | ICD-10-CM | POA: Diagnosis not present

## 2015-09-04 ENCOUNTER — Telehealth: Payer: Self-pay | Admitting: Adult Health

## 2015-09-04 ENCOUNTER — Encounter: Payer: Self-pay | Admitting: Adult Health

## 2015-09-04 ENCOUNTER — Ambulatory Visit (INDEPENDENT_AMBULATORY_CARE_PROVIDER_SITE_OTHER): Payer: Medicare Other | Admitting: Adult Health

## 2015-09-04 ENCOUNTER — Ambulatory Visit: Payer: Medicare Other | Admitting: Adult Health

## 2015-09-04 VITALS — BP 124/74 | HR 84 | Temp 98.4°F | Ht 62.0 in | Wt 233.6 lb

## 2015-09-04 DIAGNOSIS — I5030 Unspecified diastolic (congestive) heart failure: Secondary | ICD-10-CM

## 2015-09-04 DIAGNOSIS — N183 Chronic kidney disease, stage 3 (moderate): Secondary | ICD-10-CM | POA: Diagnosis not present

## 2015-09-04 DIAGNOSIS — N184 Chronic kidney disease, stage 4 (severe): Secondary | ICD-10-CM | POA: Insufficient documentation

## 2015-09-04 DIAGNOSIS — B370 Candidal stomatitis: Secondary | ICD-10-CM | POA: Diagnosis not present

## 2015-09-04 DIAGNOSIS — E1122 Type 2 diabetes mellitus with diabetic chronic kidney disease: Secondary | ICD-10-CM | POA: Diagnosis not present

## 2015-09-04 DIAGNOSIS — G4733 Obstructive sleep apnea (adult) (pediatric): Secondary | ICD-10-CM | POA: Diagnosis not present

## 2015-09-04 DIAGNOSIS — I130 Hypertensive heart and chronic kidney disease with heart failure and stage 1 through stage 4 chronic kidney disease, or unspecified chronic kidney disease: Secondary | ICD-10-CM | POA: Diagnosis not present

## 2015-09-04 DIAGNOSIS — Z09 Encounter for follow-up examination after completed treatment for conditions other than malignant neoplasm: Secondary | ICD-10-CM

## 2015-09-04 DIAGNOSIS — J45909 Unspecified asthma, uncomplicated: Secondary | ICD-10-CM | POA: Diagnosis not present

## 2015-09-04 LAB — CBC WITH DIFFERENTIAL/PLATELET
Basophils Absolute: 0 10*3/uL (ref 0.0–0.1)
Basophils Relative: 0.2 % (ref 0.0–3.0)
Eosinophils Absolute: 0.2 10*3/uL (ref 0.0–0.7)
Eosinophils Relative: 3.4 % (ref 0.0–5.0)
HCT: 36.4 % (ref 36.0–46.0)
Hemoglobin: 12.1 g/dL (ref 12.0–15.0)
Lymphocytes Relative: 15.6 % (ref 12.0–46.0)
Lymphs Abs: 1.1 10*3/uL (ref 0.7–4.0)
MCHC: 33.2 g/dL (ref 30.0–36.0)
MCV: 89 fl (ref 78.0–100.0)
Monocytes Absolute: 0.5 10*3/uL (ref 0.1–1.0)
Monocytes Relative: 6.4 % (ref 3.0–12.0)
Neutro Abs: 5.3 10*3/uL (ref 1.4–7.7)
Neutrophils Relative %: 74.4 % (ref 43.0–77.0)
Platelets: 176 10*3/uL (ref 150.0–400.0)
RBC: 4.09 Mil/uL (ref 3.87–5.11)
RDW: 17.3 % — ABNORMAL HIGH (ref 11.5–15.5)
WBC: 7.1 10*3/uL (ref 4.0–10.5)

## 2015-09-04 LAB — COMPREHENSIVE METABOLIC PANEL
ALT: 14 U/L (ref 0–35)
AST: 16 U/L (ref 0–37)
Albumin: 4.2 g/dL (ref 3.5–5.2)
Alkaline Phosphatase: 35 U/L — ABNORMAL LOW (ref 39–117)
BUN: 52 mg/dL — ABNORMAL HIGH (ref 6–23)
CO2: 30 mEq/L (ref 19–32)
Calcium: 9.6 mg/dL (ref 8.4–10.5)
Chloride: 95 mEq/L — ABNORMAL LOW (ref 96–112)
Creatinine, Ser: 2.13 mg/dL — ABNORMAL HIGH (ref 0.40–1.20)
GFR: 23.81 mL/min — ABNORMAL LOW (ref >60.00)
Glucose, Bld: 263 mg/dL — ABNORMAL HIGH (ref 70–99)
Potassium: 3.9 mEq/L (ref 3.5–5.1)
Sodium: 136 mEq/L (ref 135–145)
Total Bilirubin: 0.5 mg/dL (ref 0.2–1.2)
Total Protein: 7.6 g/dL (ref 6.0–8.3)

## 2015-09-04 MED ORDER — NYSTATIN 100000 UNIT/ML MT SUSP: 5.0000 mL | Freq: Four times a day (QID) | OROMUCOSAL | Status: DC

## 2015-09-04 NOTE — Telephone Encounter (Signed)
Informed patient of her labs. Her GFR has increased from 17 to 24. I advised to shoot for 63 grams of protein per day until she is seen by Nephrology. She has understanding of these directions

## 2015-09-04 NOTE — Patient Instructions (Signed)
It was great seeing you again and I am happy you are feeling better!  Someone will call you to schedule your appointment with cardiology as well as Nephrology.   I will follow up with you regarding your blood work.   I have sent in a prescription for Nystatin for the thrush in your mouth. Use this 3-4 times a day until resolved.

## 2015-09-04 NOTE — Progress Notes (Signed)
Pre visit review using our clinic review tool, if applicable. No additional management support is needed unless otherwise documented below in the visit note. 

## 2015-09-04 NOTE — Progress Notes (Signed)
Subjective:    Patient ID: [name], female    DOB: 30-Jul-1935, 80 y.o.   MRN: [id number]  HPI  80 year old female who presents to the office for hospital follow up. She was seen in the ER on 08/24/2015 and discharged from the hospital on 08/29/2015. She originally presented with shortness of breath and mild wheezing while on abx therapy. Chest xray in the ER showed :  IMPRESSION: Cardiomegaly. Pulmonary redistribution and interstitial opacities may represent pulmonary edema. Atypical infectious process is not Excluded.  CT showed: IMPRESSION: Study is negative for pulmonary emboli.  Mild bronchial wall thickening with associated pattern of ground-glass and nodular opacity in a peribronchovascular distribution of the lower lobes, potentially representing acute or chronic bronchitis. Correlation with lab values and patient history may be useful. No evidence of lobar pneumonia.  Atherosclerosis.  Unchanged size of right-sided tracheal esophageal soft tissue nodule, similar dating to the CT of 03/22/2009.  Upon admission to the hospital her clinical picture changed more to CHF then bronchitis/pnemonia. She was discharged home on 40 mg PO lasix. Echo with normal EF and wall motion. Grade 2 diastolic dysfunction. She was placed on 2 Liters oxygen in the hospital and was sent home with oxygen. She has not had to use it yet.   Also concern for AKI on CKD 3 Ren[place] US showed no hydronephrosis. She does have a history of multiple calculi and stents. She was last evaluated at [facility] on 8/16.  Her baseline creatine was around 1.5-1.9 during the last year.  - Losartan was stopped due to CKD   Today in the office she reports feeling " much better". Her family who are with her at this appointment confirm this. She is no longer feeling short of breath and has only trace swelling in her bilateral lower extremities ( is wearing compression socks). [name] endorses getting up  throughout the day and walking with her walker. Family endorse that they do not think she is getting up as much as she should. She is going to participate in home PT/OT.   She needs a referral to Cardiology and would like to establish with a local nephrologist so that she does not have to travel to [place].  She is currently on a reduced salt diet and fluid restriction of 3 liters. Her current weight is  Wt Readings from Last 3 Encounters:  09/04/15 233 lb 9.6 oz (105.96 kg)  08/29/15 231 lb 11.2 oz (105.098 kg)  06/11/15 245 lb 6.4 oz (111.313 kg)   She is complaining of " white spots on my tongue", likely thrush from abx use and prednisone.    Review of Systems  HENT: Negative.   Respiratory: Negative.   Cardiovascular: Positive for leg swelling.  Gastrointestinal: Negative.   Genitourinary: Negative.   Musculoskeletal: Positive for gait problem.  Skin: Negative.   Neurological: Negative.   Hematological: Negative.   All other systems reviewed and are negative.  Past Medical History  Diagnosis Date  . Unspecified hypothyroidism   . Pure hypercholesterolemia   . Unspecified essential hypertension   . Syncope and collapse   . Type II or unspecified type diabetes mellitus without mention of complication, not stated as uncontrolled   . Depressive disorder, not elsewhere classified   . Extrinsic asthma, unspecified     no problem since adulthood  . Obesity   . Anxiety state, unspecified     panic attacks  . OSA on CPAP     severe  .  Allergic rhinitis   . Respiratory failure with hypoxia ([place]) 09/2008    acute, secondary to multiple bilateral pulmonary embolism , negative hypercoagulable workup 09/2008 hospital stay  . Scoliosis     Social History   Social History  . Marital Status: Widowed    Spouse Name: N/A  . Number of Children: 2  . Years of Education: N/A   Occupational History  . OWNER     Self employed- runs Advertising copywriter   Social History Main Topics   . Smoking status: Former Smoker -- 20 years    Quit date: 09/07/1989  . Smokeless tobacco: Not on file  . Alcohol Use: No  . Drug Use: No  . Sexual Activity: Not on file   Other Topics Concern  . Not on file   Social History Narrative    Past Surgical History  Procedure Laterality Date  . Thyroid surgery    . Knee replaced    . Eye surgery    . Joint replacement      Family History  Problem Relation Age of Onset  . Arthritis Other   . Diabetes Other   . Hyperlipidemia Other   . Hypertension Other   . Coronary artery disease Other   . Stroke Other   . Allergies Mother   . Clotting disorder Mother   . Osteoarthritis Mother   . Asthma Mother   . Osteoarthritis Daughter   . Rheum arthritis Maternal Grandmother   . Clotting disorder Maternal Grandmother   . Clotting disorder Maternal Uncle   . Clotting disorder Daughter   . Allergies Daughter     Allergies  Allergen Reactions  . Aspirin   . Contrast Media [Iodinated Diagnostic Agents]   . Flagyl [Metronidazole]   . Fluticasone-Salmeterol   . Lactose Intolerance (Gi)   . Latex   . Lisinopril     REACTION: cough  . Metformin     REACTION: nausea  . Pineapple Swelling    Throat swells and blisters on tongue and roof of mouth per patient  . Sulfonamide Derivatives   . Tetracycline     Current Outpatient Prescriptions on File Prior to Visit  Medication Sig Dispense Refill  . atorvastatin (LIPITOR) 20 MG tablet Take 1 tablet (20 mg total) by mouth daily. 90 tablet 3  . Coenzyme Q10 200 MG capsule Take 200 mg by mouth daily.      Marland Kitchen dextromethorphan (DELSYM) 30 MG/5ML liquid Take 30 mg by mouth 2 (two) times daily as needed for cough.    . fenofibrate 54 MG tablet Take 1 tablet (54 mg total) by mouth daily. 30 tablet 6  . fexofenadine (ALLEGRA) 180 MG tablet Take 180 mg by mouth daily.      . furosemide (LASIX) 20 MG tablet Take 2 tablets (40 mg total) by mouth daily. 90 tablet 0  . HUMULIN 70/30 (70-30) 100  UNIT/ML injection INJECT 65 UNITS UNDER THE SKIN AS DIRECTED EVERY MORNING AND 30 UNITS EVERY EVENING 30 mL 3  . Insulin Pen Needle (B-D UF III MINI PEN NEEDLES) 31G X 5 MM MISC Use daily for insulin injection 100 each 2  . levothyroxine (SYNTHROID, LEVOTHROID) 112 MCG tablet TK 1 T PO D  2  . Multiple Vitamin (MULTIVITAMIN) tablet Take 1 tablet by mouth daily.      Marland Kitchen oxybutynin (DITROPAN) 5 MG tablet Take 5 mg by mouth daily.     Marland Kitchen oxyCODONE (OXY IR/ROXICODONE) 5 MG immediate release tablet Take 5 mg  by mouth every 4 (four) hours as needed for severe pain.    . potassium chloride SA (K-DUR,KLOR-CON) 20 MEQ tablet Take 2 tablets (40 mEq total) by mouth daily. 60 tablet 0  . Probiotic Product (PROBIOTIC PO) Take 1 capsule by mouth daily.    . ranitidine (ZANTAC) 150 MG capsule Take 150 mg by mouth 2 (two) times daily as needed for heartburn.     . sertraline (ZOLOFT) 50 MG tablet Take 50 mg by mouth daily.    . tamsulosin (FLOMAX) 0.4 MG CAPS capsule Take 0.4 mg by mouth daily.  3  . tiZANidine (ZANAFLEX) 4 MG tablet Take 1 tablet (4 mg total) by mouth every 6 (six) hours as needed for muscle spasms. 30 tablet 0   No current facility-administered medications on file prior to visit.    BP 124/74 mmHg  Pulse 84  Temp(Src) 98.4 F (36.9 C) (Oral)  Ht 5\' 2"  (1.575 m)  Wt 233 lb 9.6 oz (105.96 kg)  BMI 42.72 kg/m2  SpO2 94%       Objective:   Physical Exam  Constitutional: She is oriented to person, place, and time. She appears well-developed and well-nourished. No distress.  obese  Neck: Normal range of motion. Neck supple.  Cardiovascular: Normal rate, regular rhythm, normal heart sounds and intact distal pulses.  Exam reveals no gallop and no friction rub.   No murmur heard. Pulmonary/Chest: Effort normal and breath sounds normal. No respiratory distress. She has no rales. She exhibits no tenderness.  Musculoskeletal: Normal range of motion. She exhibits edema (trace in lower  extremities). She exhibits no tenderness.  Lymphadenopathy:    She has no cervical adenopathy.  Neurological: She is alert and oriented to person, place, and time.  Skin: Skin is warm and dry. No rash noted. She is not diaphoretic. No erythema. No pallor.  Psychiatric: She has a normal mood and affect. Her behavior is normal. Thought content normal.  Nursing note and vitals reviewed.     Assessment & Plan:  1. Hospital discharge follow-up - Ambulatory referral to Cardiology - Ambulatory referral to Nephrology - CMP - CBC with Differential/Platelet - 2533ml fluid restriction until seen by Cardiology - Stressed the importance of her moving as much as she can.  - Monitor weight and inform us if she gains more than 5 pounds.   2. Diastolic congestive heart failure, unspecified congestive heart failure chronicity ([place]) - Ambulatory referral to Cardiology - CMP - CBC with Differential/Platelet  3. Thrush - nystatin (MYCOSTATIN) 100000 UNIT/ML suspension; Take 5 mLs (500,000 Units total) by mouth 4 (four) times daily.  Dispense: 60 mL; Refill: 0 - Biotene for dry mouth  4. Chronic kidney disease (CKD), stage IV (severe) ([place]) - Ambulatory referral to Nephrology - CMP - low protein diet. Ideally 0.6 grams per kg

## 2015-09-05 DIAGNOSIS — J45909 Unspecified asthma, uncomplicated: Secondary | ICD-10-CM | POA: Diagnosis not present

## 2015-09-05 DIAGNOSIS — G4733 Obstructive sleep apnea (adult) (pediatric): Secondary | ICD-10-CM | POA: Diagnosis not present

## 2015-09-05 DIAGNOSIS — N183 Chronic kidney disease, stage 3 (moderate): Secondary | ICD-10-CM | POA: Diagnosis not present

## 2015-09-05 DIAGNOSIS — E1122 Type 2 diabetes mellitus with diabetic chronic kidney disease: Secondary | ICD-10-CM | POA: Diagnosis not present

## 2015-09-05 DIAGNOSIS — I130 Hypertensive heart and chronic kidney disease with heart failure and stage 1 through stage 4 chronic kidney disease, or unspecified chronic kidney disease: Secondary | ICD-10-CM | POA: Diagnosis not present

## 2015-09-05 DIAGNOSIS — I5030 Unspecified diastolic (congestive) heart failure: Secondary | ICD-10-CM | POA: Diagnosis not present

## 2015-09-06 DIAGNOSIS — I130 Hypertensive heart and chronic kidney disease with heart failure and stage 1 through stage 4 chronic kidney disease, or unspecified chronic kidney disease: Secondary | ICD-10-CM | POA: Diagnosis not present

## 2015-09-06 DIAGNOSIS — I5030 Unspecified diastolic (congestive) heart failure: Secondary | ICD-10-CM | POA: Diagnosis not present

## 2015-09-06 DIAGNOSIS — G4733 Obstructive sleep apnea (adult) (pediatric): Secondary | ICD-10-CM | POA: Diagnosis not present

## 2015-09-06 DIAGNOSIS — N183 Chronic kidney disease, stage 3 (moderate): Secondary | ICD-10-CM | POA: Diagnosis not present

## 2015-09-06 DIAGNOSIS — E1122 Type 2 diabetes mellitus with diabetic chronic kidney disease: Secondary | ICD-10-CM | POA: Diagnosis not present

## 2015-09-06 DIAGNOSIS — J45909 Unspecified asthma, uncomplicated: Secondary | ICD-10-CM | POA: Diagnosis not present

## 2015-09-10 DIAGNOSIS — I5030 Unspecified diastolic (congestive) heart failure: Secondary | ICD-10-CM | POA: Diagnosis not present

## 2015-09-10 DIAGNOSIS — G4733 Obstructive sleep apnea (adult) (pediatric): Secondary | ICD-10-CM | POA: Diagnosis not present

## 2015-09-10 DIAGNOSIS — J45909 Unspecified asthma, uncomplicated: Secondary | ICD-10-CM | POA: Diagnosis not present

## 2015-09-10 DIAGNOSIS — N183 Chronic kidney disease, stage 3 (moderate): Secondary | ICD-10-CM | POA: Diagnosis not present

## 2015-09-10 DIAGNOSIS — I130 Hypertensive heart and chronic kidney disease with heart failure and stage 1 through stage 4 chronic kidney disease, or unspecified chronic kidney disease: Secondary | ICD-10-CM | POA: Diagnosis not present

## 2015-09-10 DIAGNOSIS — E1122 Type 2 diabetes mellitus with diabetic chronic kidney disease: Secondary | ICD-10-CM | POA: Diagnosis not present

## 2015-09-11 ENCOUNTER — Encounter: Payer: Medicare Other | Admitting: Physical Therapy

## 2015-09-11 ENCOUNTER — Other Ambulatory Visit (INDEPENDENT_AMBULATORY_CARE_PROVIDER_SITE_OTHER): Payer: Medicare Other

## 2015-09-11 DIAGNOSIS — E119 Type 2 diabetes mellitus without complications: Secondary | ICD-10-CM

## 2015-09-11 DIAGNOSIS — I519 Heart disease, unspecified: Secondary | ICD-10-CM

## 2015-09-11 DIAGNOSIS — E039 Hypothyroidism, unspecified: Secondary | ICD-10-CM | POA: Diagnosis not present

## 2015-09-11 DIAGNOSIS — E785 Hyperlipidemia, unspecified: Secondary | ICD-10-CM | POA: Diagnosis not present

## 2015-09-11 DIAGNOSIS — D649 Anemia, unspecified: Secondary | ICD-10-CM | POA: Diagnosis not present

## 2015-09-11 DIAGNOSIS — Z0000 Encounter for general adult medical examination without abnormal findings: Secondary | ICD-10-CM

## 2015-09-11 LAB — LDL CHOLESTEROL, DIRECT: Direct LDL: 90 mg/dL

## 2015-09-11 LAB — HEPATIC FUNCTION PANEL
ALT: 14 U/L (ref 0–35)
AST: 15 U/L (ref 0–37)
Albumin: 4.3 g/dL (ref 3.5–5.2)
Alkaline Phosphatase: 36 U/L — ABNORMAL LOW (ref 39–117)
Bilirubin, Direct: 0.1 mg/dL (ref 0.0–0.3)
Total Bilirubin: 0.6 mg/dL (ref 0.2–1.2)
Total Protein: 7.1 g/dL (ref 6.0–8.3)

## 2015-09-11 LAB — POCT URINALYSIS DIPSTICK
Bilirubin, UA: NEGATIVE
Blood, UA: NEGATIVE
Glucose, UA: NEGATIVE
Ketones, UA: NEGATIVE
Nitrite, UA: NEGATIVE
Spec Grav, UA: 1.02
Urobilinogen, UA: 0.2
pH, UA: 5

## 2015-09-11 LAB — CBC WITH DIFFERENTIAL/PLATELET
Basophils Absolute: 0 10*3/uL (ref 0.0–0.1)
Basophils Relative: 0.6 % (ref 0.0–3.0)
Eosinophils Absolute: 0.2 10*3/uL (ref 0.0–0.7)
Eosinophils Relative: 3.2 % (ref 0.0–5.0)
HCT: 34.3 % — ABNORMAL LOW (ref 36.0–46.0)
Hemoglobin: 11.3 g/dL — ABNORMAL LOW (ref 12.0–15.0)
Lymphocytes Relative: 19.3 % (ref 12.0–46.0)
Lymphs Abs: 0.9 10*3/uL (ref 0.7–4.0)
MCHC: 33 g/dL (ref 30.0–36.0)
MCV: 89.6 fl (ref 78.0–100.0)
Monocytes Absolute: 0.3 10*3/uL (ref 0.1–1.0)
Monocytes Relative: 6.8 % (ref 3.0–12.0)
Neutro Abs: 3.3 10*3/uL (ref 1.4–7.7)
Neutrophils Relative %: 70.1 % (ref 43.0–77.0)
Platelets: 140 10*3/uL — ABNORMAL LOW (ref 150.0–400.0)
RBC: 3.83 Mil/uL — ABNORMAL LOW (ref 3.87–5.11)
RDW: 18.2 % — ABNORMAL HIGH (ref 11.5–15.5)
WBC: 4.8 10*3/uL (ref 4.0–10.5)

## 2015-09-11 LAB — BASIC METABOLIC PANEL
BUN: 57 mg/dL — ABNORMAL HIGH (ref 6–23)
CO2: 28 mEq/L (ref 19–32)
Calcium: 10.4 mg/dL (ref 8.4–10.5)
Chloride: 98 mEq/L (ref 96–112)
Creatinine, Ser: 2.15 mg/dL — ABNORMAL HIGH (ref 0.40–1.20)
GFR: 23.55 mL/min — ABNORMAL LOW (ref >60.00)
Glucose, Bld: 284 mg/dL — ABNORMAL HIGH (ref 70–99)
Potassium: 5 mEq/L (ref 3.5–5.1)
Sodium: 137 mEq/L (ref 135–145)

## 2015-09-11 LAB — MICROALBUMIN / CREATININE URINE RATIO
Creatinine,U: 128.3 mg/dL
Microalb Creat Ratio: 13.5 mg/g (ref 0.0–30.0)
Microalb, Ur: 17.3 mg/dL — ABNORMAL HIGH (ref 0.0–1.9)

## 2015-09-11 LAB — TSH: TSH: 0.87 u[IU]/mL (ref 0.35–4.50)

## 2015-09-11 LAB — HEMOGLOBIN A1C: Hgb A1c MFr Bld: 8.3 % — ABNORMAL HIGH (ref 4.6–6.5)

## 2015-09-11 LAB — LIPID PANEL
Cholesterol: 223 mg/dL — ABNORMAL HIGH (ref 0–200)
HDL: 33.8 mg/dL — ABNORMAL LOW (ref >39.00)
Total CHOL/HDL Ratio: 7
Triglycerides: 516 mg/dL — ABNORMAL HIGH (ref 0.0–149.0)

## 2015-09-12 DIAGNOSIS — N183 Chronic kidney disease, stage 3 (moderate): Secondary | ICD-10-CM | POA: Diagnosis not present

## 2015-09-12 DIAGNOSIS — G4733 Obstructive sleep apnea (adult) (pediatric): Secondary | ICD-10-CM | POA: Diagnosis not present

## 2015-09-12 DIAGNOSIS — E1122 Type 2 diabetes mellitus with diabetic chronic kidney disease: Secondary | ICD-10-CM | POA: Diagnosis not present

## 2015-09-12 DIAGNOSIS — I5030 Unspecified diastolic (congestive) heart failure: Secondary | ICD-10-CM | POA: Diagnosis not present

## 2015-09-12 DIAGNOSIS — I130 Hypertensive heart and chronic kidney disease with heart failure and stage 1 through stage 4 chronic kidney disease, or unspecified chronic kidney disease: Secondary | ICD-10-CM | POA: Diagnosis not present

## 2015-09-12 DIAGNOSIS — J45909 Unspecified asthma, uncomplicated: Secondary | ICD-10-CM | POA: Diagnosis not present

## 2015-09-13 ENCOUNTER — Encounter: Payer: Medicare Other | Admitting: Physical Therapy

## 2015-09-14 DIAGNOSIS — E1122 Type 2 diabetes mellitus with diabetic chronic kidney disease: Secondary | ICD-10-CM | POA: Diagnosis not present

## 2015-09-14 DIAGNOSIS — I130 Hypertensive heart and chronic kidney disease with heart failure and stage 1 through stage 4 chronic kidney disease, or unspecified chronic kidney disease: Secondary | ICD-10-CM | POA: Diagnosis not present

## 2015-09-14 DIAGNOSIS — N183 Chronic kidney disease, stage 3 (moderate): Secondary | ICD-10-CM | POA: Diagnosis not present

## 2015-09-14 DIAGNOSIS — G4733 Obstructive sleep apnea (adult) (pediatric): Secondary | ICD-10-CM | POA: Diagnosis not present

## 2015-09-14 DIAGNOSIS — I5030 Unspecified diastolic (congestive) heart failure: Secondary | ICD-10-CM | POA: Diagnosis not present

## 2015-09-14 DIAGNOSIS — J45909 Unspecified asthma, uncomplicated: Secondary | ICD-10-CM | POA: Diagnosis not present

## 2015-09-16 ENCOUNTER — Encounter: Payer: Medicare Other | Admitting: Physical Therapy

## 2015-09-16 DIAGNOSIS — N183 Chronic kidney disease, stage 3 (moderate): Secondary | ICD-10-CM | POA: Diagnosis not present

## 2015-09-16 DIAGNOSIS — N179 Acute kidney failure, unspecified: Secondary | ICD-10-CM | POA: Diagnosis not present

## 2015-09-16 DIAGNOSIS — N200 Calculus of kidney: Secondary | ICD-10-CM | POA: Diagnosis not present

## 2015-09-16 DIAGNOSIS — I10 Essential (primary) hypertension: Secondary | ICD-10-CM | POA: Diagnosis not present

## 2015-09-17 ENCOUNTER — Telehealth: Payer: Self-pay | Admitting: Adult Health

## 2015-09-17 ENCOUNTER — Other Ambulatory Visit: Payer: Self-pay | Admitting: Adult Health

## 2015-09-17 DIAGNOSIS — E1122 Type 2 diabetes mellitus with diabetic chronic kidney disease: Secondary | ICD-10-CM | POA: Diagnosis not present

## 2015-09-17 DIAGNOSIS — I5030 Unspecified diastolic (congestive) heart failure: Secondary | ICD-10-CM | POA: Diagnosis not present

## 2015-09-17 DIAGNOSIS — J45909 Unspecified asthma, uncomplicated: Secondary | ICD-10-CM | POA: Diagnosis not present

## 2015-09-17 DIAGNOSIS — G4733 Obstructive sleep apnea (adult) (pediatric): Secondary | ICD-10-CM | POA: Diagnosis not present

## 2015-09-17 DIAGNOSIS — N183 Chronic kidney disease, stage 3 (moderate): Secondary | ICD-10-CM | POA: Diagnosis not present

## 2015-09-17 DIAGNOSIS — I130 Hypertensive heart and chronic kidney disease with heart failure and stage 1 through stage 4 chronic kidney disease, or unspecified chronic kidney disease: Secondary | ICD-10-CM | POA: Diagnosis not present

## 2015-09-17 MED ORDER — ATORVASTATIN CALCIUM 40 MG PO TABS: 40.0000 mg | ORAL_TABLET | Freq: Every day | ORAL | Status: DC

## 2015-09-17 NOTE — Telephone Encounter (Signed)
Spoke to patient on the phone and informed her of her labs ( I am unsure why she came in and had a full set of labs done when her next appointment is not until April 2017). Her A1c continues to be elevated and 8.4 and we will go up on her night time insulin from 30 - 35 units. She is going to let me know how she responds to this in one week, at which point we will consider switching to lantus at night.   Her cholesterol and triglycerides continues to be elevated (although they have reduced significnatly). She has no side effects of Lipitor, will go up on Lipitor from 20 mg to 40 mg.   On a positive note, she endorses that she is feeling " much better" and continues to work with PT at home, which she is enjoying.

## 2015-09-18 ENCOUNTER — Encounter: Payer: Medicare Other | Admitting: Physical Therapy

## 2015-09-18 DIAGNOSIS — N183 Chronic kidney disease, stage 3 (moderate): Secondary | ICD-10-CM | POA: Diagnosis not present

## 2015-09-18 DIAGNOSIS — E1122 Type 2 diabetes mellitus with diabetic chronic kidney disease: Secondary | ICD-10-CM | POA: Diagnosis not present

## 2015-09-18 DIAGNOSIS — J45909 Unspecified asthma, uncomplicated: Secondary | ICD-10-CM | POA: Diagnosis not present

## 2015-09-18 DIAGNOSIS — G4733 Obstructive sleep apnea (adult) (pediatric): Secondary | ICD-10-CM | POA: Diagnosis not present

## 2015-09-18 DIAGNOSIS — I5030 Unspecified diastolic (congestive) heart failure: Secondary | ICD-10-CM | POA: Diagnosis not present

## 2015-09-18 DIAGNOSIS — I130 Hypertensive heart and chronic kidney disease with heart failure and stage 1 through stage 4 chronic kidney disease, or unspecified chronic kidney disease: Secondary | ICD-10-CM | POA: Diagnosis not present

## 2015-09-19 DIAGNOSIS — I130 Hypertensive heart and chronic kidney disease with heart failure and stage 1 through stage 4 chronic kidney disease, or unspecified chronic kidney disease: Secondary | ICD-10-CM | POA: Diagnosis not present

## 2015-09-19 DIAGNOSIS — I5030 Unspecified diastolic (congestive) heart failure: Secondary | ICD-10-CM | POA: Diagnosis not present

## 2015-09-19 DIAGNOSIS — N183 Chronic kidney disease, stage 3 (moderate): Secondary | ICD-10-CM | POA: Diagnosis not present

## 2015-09-19 DIAGNOSIS — E1122 Type 2 diabetes mellitus with diabetic chronic kidney disease: Secondary | ICD-10-CM | POA: Diagnosis not present

## 2015-09-19 DIAGNOSIS — G4733 Obstructive sleep apnea (adult) (pediatric): Secondary | ICD-10-CM | POA: Diagnosis not present

## 2015-09-19 DIAGNOSIS — J45909 Unspecified asthma, uncomplicated: Secondary | ICD-10-CM | POA: Diagnosis not present

## 2015-09-20 DIAGNOSIS — N183 Chronic kidney disease, stage 3 (moderate): Secondary | ICD-10-CM | POA: Diagnosis not present

## 2015-09-20 DIAGNOSIS — I130 Hypertensive heart and chronic kidney disease with heart failure and stage 1 through stage 4 chronic kidney disease, or unspecified chronic kidney disease: Secondary | ICD-10-CM | POA: Diagnosis not present

## 2015-09-20 DIAGNOSIS — I5030 Unspecified diastolic (congestive) heart failure: Secondary | ICD-10-CM | POA: Diagnosis not present

## 2015-09-20 DIAGNOSIS — G4733 Obstructive sleep apnea (adult) (pediatric): Secondary | ICD-10-CM | POA: Diagnosis not present

## 2015-09-20 DIAGNOSIS — J45909 Unspecified asthma, uncomplicated: Secondary | ICD-10-CM | POA: Diagnosis not present

## 2015-09-20 DIAGNOSIS — E1122 Type 2 diabetes mellitus with diabetic chronic kidney disease: Secondary | ICD-10-CM | POA: Diagnosis not present

## 2015-09-21 DIAGNOSIS — I5030 Unspecified diastolic (congestive) heart failure: Secondary | ICD-10-CM | POA: Diagnosis not present

## 2015-09-21 DIAGNOSIS — E1122 Type 2 diabetes mellitus with diabetic chronic kidney disease: Secondary | ICD-10-CM | POA: Diagnosis not present

## 2015-09-21 DIAGNOSIS — J45909 Unspecified asthma, uncomplicated: Secondary | ICD-10-CM | POA: Diagnosis not present

## 2015-09-21 DIAGNOSIS — I130 Hypertensive heart and chronic kidney disease with heart failure and stage 1 through stage 4 chronic kidney disease, or unspecified chronic kidney disease: Secondary | ICD-10-CM | POA: Diagnosis not present

## 2015-09-21 DIAGNOSIS — G4733 Obstructive sleep apnea (adult) (pediatric): Secondary | ICD-10-CM | POA: Diagnosis not present

## 2015-09-21 DIAGNOSIS — N183 Chronic kidney disease, stage 3 (moderate): Secondary | ICD-10-CM | POA: Diagnosis not present

## 2015-09-23 ENCOUNTER — Other Ambulatory Visit: Payer: Self-pay | Admitting: Adult Health

## 2015-09-23 ENCOUNTER — Encounter: Payer: Medicare Other | Admitting: Physical Therapy

## 2015-09-23 DIAGNOSIS — N200 Calculus of kidney: Secondary | ICD-10-CM | POA: Diagnosis not present

## 2015-09-23 DIAGNOSIS — N183 Chronic kidney disease, stage 3 (moderate): Secondary | ICD-10-CM | POA: Diagnosis not present

## 2015-09-24 ENCOUNTER — Other Ambulatory Visit: Payer: Self-pay | Admitting: *Deleted

## 2015-09-24 DIAGNOSIS — J45909 Unspecified asthma, uncomplicated: Secondary | ICD-10-CM | POA: Diagnosis not present

## 2015-09-24 DIAGNOSIS — G4733 Obstructive sleep apnea (adult) (pediatric): Secondary | ICD-10-CM | POA: Diagnosis not present

## 2015-09-24 DIAGNOSIS — E1122 Type 2 diabetes mellitus with diabetic chronic kidney disease: Secondary | ICD-10-CM | POA: Diagnosis not present

## 2015-09-24 DIAGNOSIS — I5030 Unspecified diastolic (congestive) heart failure: Secondary | ICD-10-CM | POA: Diagnosis not present

## 2015-09-24 DIAGNOSIS — I130 Hypertensive heart and chronic kidney disease with heart failure and stage 1 through stage 4 chronic kidney disease, or unspecified chronic kidney disease: Secondary | ICD-10-CM | POA: Diagnosis not present

## 2015-09-24 DIAGNOSIS — N183 Chronic kidney disease, stage 3 (moderate): Secondary | ICD-10-CM | POA: Diagnosis not present

## 2015-09-25 ENCOUNTER — Encounter: Payer: Medicare Other | Admitting: Physical Therapy

## 2015-09-25 DIAGNOSIS — I130 Hypertensive heart and chronic kidney disease with heart failure and stage 1 through stage 4 chronic kidney disease, or unspecified chronic kidney disease: Secondary | ICD-10-CM | POA: Diagnosis not present

## 2015-09-25 DIAGNOSIS — N183 Chronic kidney disease, stage 3 (moderate): Secondary | ICD-10-CM | POA: Diagnosis not present

## 2015-09-25 DIAGNOSIS — I5030 Unspecified diastolic (congestive) heart failure: Secondary | ICD-10-CM | POA: Diagnosis not present

## 2015-09-25 DIAGNOSIS — J45909 Unspecified asthma, uncomplicated: Secondary | ICD-10-CM | POA: Diagnosis not present

## 2015-09-25 DIAGNOSIS — E1122 Type 2 diabetes mellitus with diabetic chronic kidney disease: Secondary | ICD-10-CM | POA: Diagnosis not present

## 2015-09-25 DIAGNOSIS — G4733 Obstructive sleep apnea (adult) (pediatric): Secondary | ICD-10-CM | POA: Diagnosis not present

## 2015-09-26 ENCOUNTER — Ambulatory Visit (INDEPENDENT_AMBULATORY_CARE_PROVIDER_SITE_OTHER): Payer: Medicare Other | Admitting: Cardiology

## 2015-09-26 ENCOUNTER — Encounter: Payer: Self-pay | Admitting: Cardiology

## 2015-09-26 VITALS — BP 130/78 | HR 78 | Ht 62.0 in | Wt 236.8 lb

## 2015-09-26 DIAGNOSIS — E785 Hyperlipidemia, unspecified: Secondary | ICD-10-CM

## 2015-09-26 DIAGNOSIS — I5032 Chronic diastolic (congestive) heart failure: Secondary | ICD-10-CM | POA: Diagnosis not present

## 2015-09-26 DIAGNOSIS — N184 Chronic kidney disease, stage 4 (severe): Secondary | ICD-10-CM | POA: Diagnosis not present

## 2015-09-26 DIAGNOSIS — I2584 Coronary atherosclerosis due to calcified coronary lesion: Secondary | ICD-10-CM

## 2015-09-26 DIAGNOSIS — I2510 Atherosclerotic heart disease of native coronary artery without angina pectoris: Secondary | ICD-10-CM | POA: Diagnosis not present

## 2015-09-26 DIAGNOSIS — I700 Atherosclerosis of aorta: Secondary | ICD-10-CM

## 2015-09-26 DIAGNOSIS — E669 Obesity, unspecified: Secondary | ICD-10-CM

## 2015-09-26 NOTE — Progress Notes (Signed)
Cardiology Office Note    Date:  09/26/2015   ID:  STEPHANE CRISTEA, DOB [birth year], MRN [id number]  PCP:  Dorothyann Peng, NP  Cardiologist:   [name], MD   No chief complaint on file.   History of Present Illness:  [name] is a 81 y.o. female here for evaluation of diastolic heart failure. She was admitted with dyspnea in December 2016 and concern for PE, CT scan was negative. Creatinine increased from 1.72 2.9. Last October creatinine was 1.4. She was successfully diuresed.  Initially, her symptoms were thought to be questionable bronchitis/pneumonia with reactive airways disease and she completed a course of Levaquin antibiotics and Solu-Medrol taper. Flu was negative. Her BNP was only 151 and she was given empiric Lasix IV 60 mg a day and was -8 L by the time of discharge. She ended up being released with 40 mg of Lasix at home. Her echocardiogram was normal ejection fraction with grade 2 diastolic dysfunction.  Physical history of multiple renal calculi and stents.  Thankfully she is no longer feeling short of breath, minimal swelling bilaterally. Uses walker for ambulation. She is watching her fluids and salt intake.  She told me that in October 2015 she had a "septic heart attack ". This was likely demand ischemia.Nuclear stress test was performed at that time and was unremarkable. Sepsis was in the setting of renal stones.    Past Medical History  Diagnosis Date  . Unspecified hypothyroidism   . Pure hypercholesterolemia   . Unspecified essential hypertension   . Syncope and collapse   . Type II or unspecified type diabetes mellitus without mention of complication, not stated as uncontrolled   . Depressive disorder, not elsewhere classified   . Extrinsic asthma, unspecified     no problem since adulthood  . Obesity   . Anxiety state, unspecified     panic attacks  . OSA on CPAP     severe  . Allergic rhinitis   . Respiratory failure with hypoxia ([place])  09/2008    acute, secondary to multiple bilateral pulmonary embolism , negative hypercoagulable workup 09/2008 hospital stay  . Scoliosis     Past Surgical History  Procedure Laterality Date  . Thyroid surgery    . Knee replaced    . Eye surgery    . Joint replacement      Outpatient Prescriptions Prior to Visit  Medication Sig Dispense Refill  . atorvastatin (LIPITOR) 40 MG tablet Take 1 tablet (40 mg total) by mouth daily. 90 tablet 3  . Coenzyme Q10 200 MG capsule Take 200 mg by mouth daily.      Marland Kitchen dextromethorphan (DELSYM) 30 MG/5ML liquid Take 30 mg by mouth 2 (two) times daily as needed for cough.    . fenofibrate 54 MG tablet Take 1 tablet (54 mg total) by mouth daily. 30 tablet 6  . fexofenadine (ALLEGRA) 180 MG tablet Take 180 mg by mouth daily.      . furosemide (LASIX) 20 MG tablet Take 2 tablets (40 mg total) by mouth daily. 90 tablet 0  . HUMULIN 70/30 (70-30) 100 UNIT/ML injection INJECT 65 UNITS UNDER THE SKIN AS DIRECTED EVERY MORNING AND 30 UNITS EVERY EVENING 30 mL 3  . Insulin Pen Needle (B-D UF III MINI PEN NEEDLES) 31G X 5 MM MISC Use daily for insulin injection 100 each 2  . levothyroxine (SYNTHROID, LEVOTHROID) 112 MCG tablet TK 1 T PO D  2  . Multiple Vitamin (MULTIVITAMIN)  tablet Take 1 tablet by mouth daily.      Marland Kitchen nystatin (MYCOSTATIN) 100000 UNIT/ML suspension Take 5 mLs (500,000 Units total) by mouth 4 (four) times daily. 60 mL 0  . oxybutynin (DITROPAN) 5 MG tablet Take 5 mg by mouth daily.     Marland Kitchen oxyCODONE (OXY IR/ROXICODONE) 5 MG immediate release tablet Take 5 mg by mouth every 4 (four) hours as needed for severe pain.    . potassium chloride SA (K-DUR,KLOR-CON) 20 MEQ tablet TK 2 TS PO DAILY  0  . Probiotic Product (PROBIOTIC PO) Take 1 capsule by mouth daily.    . ranitidine (ZANTAC) 150 MG capsule Take 150 mg by mouth 2 (two) times daily as needed for heartburn.     . sertraline (ZOLOFT) 50 MG tablet Take 50 mg by mouth daily.    . tamsulosin (FLOMAX)  0.4 MG CAPS capsule Take 0.4 mg by mouth daily.  3  . tiZANidine (ZANAFLEX) 4 MG tablet Take 1 tablet (4 mg total) by mouth every 6 (six) hours as needed for muscle spasms. 30 tablet 0   No facility-administered medications prior to visit.     Allergies:   Aspirin; Contrast media; Flagyl; Fluticasone-salmeterol; Lactose intolerance (gi); Latex; Lisinopril; Metformin; Pineapple; Sulfonamide derivatives; and Tetracycline   Social History   Social History  . Marital Status: Widowed    Spouse Name: N/A  . Number of Children: 2  . Years of Education: N/A   Occupational History  . 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 Smoking status: Former Smoker -- 20 years    Quit date: 09/07/1989  . Smokeless tobacco: None  . Alcohol Use: No  . Drug Use: No  . Sexual Activity: Not Asked   Other Topics Concern  . None   Social History Narrative     Family History:  The patient's family history includes Allergies in her daughter and mother; Arthritis in her other; Asthma in her mother; Clotting disorder in her daughter, maternal grandmother, maternal uncle, and mother; Coronary artery disease in her other; Diabetes in her other; Hyperlipidemia in her other; Hypertension in her other; Osteoarthritis in her daughter and mother; Rheum arthritis in her maternal grandmother; Stroke in her other.   ROS:   Please see the history of present illness.    ROS  No sicca be, no bleeding, no orthopnea, no PND All other systems reviewed and are negative.   PHYSICAL EXAM:   VS:  BP 130/78 mmHg  Pulse 78  Ht 5\' 2"  (1.575 m)  Wt 236 lb 12.8 oz (107.412 kg)  BMI 43.30 kg/m2  SpO2 98%   GEN: Well nourished, well developed, in no acute distressambulatory walker HEENT: normal Neck: no JVD, carotid bruits, or masses Cardiac: RRR; no murmurs, rubs, or gallops,no edema  Respiratory:  clear to auscultation bilaterally, normal work of breathing GI: soft, nontender,  nondistended, + BSObese MS: no deformity or atrophy Skin: warm and dry, no rash Neuro:  Alert and Oriented x 3, Strength and sensation are intact Psych: euthymic mood, full affect  Wt Readings from Last 3 Encounters:  09/26/15 236 lb 12.8 oz (107.412 kg)  09/04/15 233 lb 9.6 oz (105.96 kg)  08/29/15 231 lb 11.2 oz (105.098 kg)      Studies/Labs Reviewed:   EKG:  08/24/15-sinus rhythm, 70, left axis deviation, poor R-wave progression personally viewed  Recent Labs: 08/25/2015: Magnesium 2.2 08/26/2015: B Natriuretic Peptide 151.4* 09/11/2015:  ALT 14; BUN 57*; Creatinine, Ser 2.15*; Hemoglobin 11.3*; Platelets 140.0*; Potassium 5.0; Sodium 137; TSH 0.87   Lipid Panel    Component Value Date/Time   CHOL 223* 09/11/2015 1012   TRIG * 09/11/2015 1012    516.0 Triglyceride is over 400; calculations on Lipids are invalid.   HDL 33.80* 09/11/2015 1012   CHOLHDL 7 09/11/2015 1012   VLDL 118.2* 09/25/2011 1511   LDLCALC 65 08/16/2009 0902   LDLDIRECT 90.0 09/11/2015 1012    Additional studies/ records that were reviewed today include:   08/24/15-CT Angio chest PE-minimal coronary artery calcification of the ostial LAD. Aortic atherosclerosis noted.  Nuclear stress test was performed in October 2016 in [place]. Low risk    ASSESSMENT:    1. Chronic diastolic heart failure ([place])   2. Chronic kidney disease (CKD), stage IV (severe) ([facility])   3. Obesity   4. Hyperlipidemia      PLAN:  In order of problems listed above:  1. Currently well compensated. Doing well. Continue with mobilization, fluid restriction Encourage 2 L, salt restriction encourage 2 g as well as antihypertensives and Lasix therapy. 2. We discussed fluid restriction how it is important for her to maintain her current body weight and perhaps even strive for a lower body weight, 231 post Hospital. 3. Decreasing carbohydrates, weight loss is very important. This may help her overall with her diabetes,  conditioning, diastolic heart failure. 4. History of PE-stable, CT scan normal. 5. History of nephrolithiasis-it will be a given take between fluid restriction, Lasix etc. 6. Aortic atherosclerosis-continue with statin, secondary prevention 7. Nephrology per [facility].     Medication Adjustments/Labs and Tests Ordered: Current medicines are reviewed at length with the patient today.  Concerns regarding medicines are outlined above.  Medication changes, Labs and Tests ordered today are listed in the Patient Instructions below. There are no Patient Instructions on file for this visit.     Bobby Rumpf, MD  09/26/2015 2:07 PM    [facility] [address], [place], [place]  [address] Phone: [phone]; Fax: [phone]

## 2015-09-26 NOTE — Patient Instructions (Signed)
Medication Instructions:  The current medical regimen is effective;  continue present plan and medications.  Please weigh daily and keep a diary of weighs.  If elevated above 2-3 lbs over night pls call and let us know. Keep feet and legs elevated as much as possible during the day. Limit sodium intake to less than 2,000 mg a day.  Follow-Up: Follow up in 4 months with Dr Marlou Porch.  If you need a refill on your cardiac medications before your next appointment, please call your pharmacy.  Thank you for choosing [facility]!!

## 2015-09-27 DIAGNOSIS — E1122 Type 2 diabetes mellitus with diabetic chronic kidney disease: Secondary | ICD-10-CM | POA: Diagnosis not present

## 2015-09-27 DIAGNOSIS — I130 Hypertensive heart and chronic kidney disease with heart failure and stage 1 through stage 4 chronic kidney disease, or unspecified chronic kidney disease: Secondary | ICD-10-CM | POA: Diagnosis not present

## 2015-09-27 DIAGNOSIS — I5030 Unspecified diastolic (congestive) heart failure: Secondary | ICD-10-CM | POA: Diagnosis not present

## 2015-09-27 DIAGNOSIS — N183 Chronic kidney disease, stage 3 (moderate): Secondary | ICD-10-CM | POA: Diagnosis not present

## 2015-09-27 DIAGNOSIS — J45909 Unspecified asthma, uncomplicated: Secondary | ICD-10-CM | POA: Diagnosis not present

## 2015-09-27 DIAGNOSIS — G4733 Obstructive sleep apnea (adult) (pediatric): Secondary | ICD-10-CM | POA: Diagnosis not present

## 2015-09-30 ENCOUNTER — Encounter: Payer: Medicare Other | Admitting: Physical Therapy

## 2015-10-01 ENCOUNTER — Telehealth: Payer: Self-pay | Admitting: Adult Health

## 2015-10-01 DIAGNOSIS — N183 Chronic kidney disease, stage 3 unspecified: Secondary | ICD-10-CM

## 2015-10-01 DIAGNOSIS — G4733 Obstructive sleep apnea (adult) (pediatric): Secondary | ICD-10-CM | POA: Diagnosis not present

## 2015-10-01 DIAGNOSIS — Z794 Long term (current) use of insulin: Principal | ICD-10-CM

## 2015-10-01 DIAGNOSIS — I130 Hypertensive heart and chronic kidney disease with heart failure and stage 1 through stage 4 chronic kidney disease, or unspecified chronic kidney disease: Secondary | ICD-10-CM | POA: Diagnosis not present

## 2015-10-01 DIAGNOSIS — J45909 Unspecified asthma, uncomplicated: Secondary | ICD-10-CM | POA: Diagnosis not present

## 2015-10-01 DIAGNOSIS — E1165 Type 2 diabetes mellitus with hyperglycemia: Principal | ICD-10-CM

## 2015-10-01 DIAGNOSIS — E1122 Type 2 diabetes mellitus with diabetic chronic kidney disease: Secondary | ICD-10-CM

## 2015-10-01 DIAGNOSIS — IMO0002 Reserved for concepts with insufficient information to code with codable children: Secondary | ICD-10-CM

## 2015-10-01 DIAGNOSIS — I5030 Unspecified diastolic (congestive) heart failure: Secondary | ICD-10-CM | POA: Diagnosis not present

## 2015-10-01 NOTE — Telephone Encounter (Signed)
Pt recently seen on 09/04/2015 for hospital f/u. Pending appt on 12/10/2015 Okay to refer for her uncontrolled diabetes?

## 2015-10-01 NOTE — Telephone Encounter (Signed)
Pt said her diabetes has been all over the place and is asking for a referral to see a endocrinology. She said she has seen Dr Chalmers Cater at [facility]

## 2015-10-02 ENCOUNTER — Encounter: Payer: Medicare Other | Admitting: Physical Therapy

## 2015-10-02 NOTE — Telephone Encounter (Signed)
Refer entered to be scheduled.

## 2015-10-02 NOTE — Telephone Encounter (Signed)
That is fine 

## 2015-10-03 DIAGNOSIS — I5030 Unspecified diastolic (congestive) heart failure: Secondary | ICD-10-CM | POA: Diagnosis not present

## 2015-10-03 DIAGNOSIS — E1122 Type 2 diabetes mellitus with diabetic chronic kidney disease: Secondary | ICD-10-CM | POA: Diagnosis not present

## 2015-10-03 DIAGNOSIS — G4733 Obstructive sleep apnea (adult) (pediatric): Secondary | ICD-10-CM | POA: Diagnosis not present

## 2015-10-03 DIAGNOSIS — J45909 Unspecified asthma, uncomplicated: Secondary | ICD-10-CM | POA: Diagnosis not present

## 2015-10-03 DIAGNOSIS — I130 Hypertensive heart and chronic kidney disease with heart failure and stage 1 through stage 4 chronic kidney disease, or unspecified chronic kidney disease: Secondary | ICD-10-CM | POA: Diagnosis not present

## 2015-10-03 DIAGNOSIS — N183 Chronic kidney disease, stage 3 (moderate): Secondary | ICD-10-CM | POA: Diagnosis not present

## 2015-10-04 ENCOUNTER — Telehealth: Payer: Self-pay | Admitting: Adult Health

## 2015-10-04 NOTE — Telephone Encounter (Signed)
pls advise

## 2015-10-04 NOTE — Telephone Encounter (Signed)
Pt's sugar has been running high, (in the 200-250) . In the hospital she was on a sliding scale and it ws great then, But now that she is home  Nurse Owens Shark with [facility] states pt is not eating right, and not managing very well. Pt would like to switch to [facility] .  She sees dr Chalmers Cater in March,  but wants to stay in the [facility] network. Ms Owens Shark states pt speaks very highly of [facility] Rumps and has assured her [facility] Rumps will take care of her!  If you needs to call the nurse Gerald Stabs brown, her number is above, or She  States pt is otherwise doing great, no O2, getting around good. But will not listen when it comes to advise on food. But she would like if we would call pt and hopefully get her into [facility].

## 2015-10-07 ENCOUNTER — Telehealth: Payer: Self-pay | Admitting: Adult Health

## 2015-10-07 DIAGNOSIS — J45909 Unspecified asthma, uncomplicated: Secondary | ICD-10-CM | POA: Diagnosis not present

## 2015-10-07 DIAGNOSIS — E1122 Type 2 diabetes mellitus with diabetic chronic kidney disease: Secondary | ICD-10-CM | POA: Diagnosis not present

## 2015-10-07 DIAGNOSIS — G4733 Obstructive sleep apnea (adult) (pediatric): Secondary | ICD-10-CM | POA: Diagnosis not present

## 2015-10-07 DIAGNOSIS — I130 Hypertensive heart and chronic kidney disease with heart failure and stage 1 through stage 4 chronic kidney disease, or unspecified chronic kidney disease: Secondary | ICD-10-CM | POA: Diagnosis not present

## 2015-10-07 DIAGNOSIS — I5030 Unspecified diastolic (congestive) heart failure: Secondary | ICD-10-CM | POA: Diagnosis not present

## 2015-10-07 DIAGNOSIS — N183 Chronic kidney disease, stage 3 (moderate): Secondary | ICD-10-CM | POA: Diagnosis not present

## 2015-10-07 NOTE — Telephone Encounter (Signed)
Ok for PT 

## 2015-10-07 NOTE — Telephone Encounter (Signed)
[name] needs verbal order for PT for twice a wk for 4 wks. to work on patient  balance

## 2015-10-07 NOTE — Telephone Encounter (Signed)
Left a detailed message for Leroy Kennedy PT.

## 2015-10-08 NOTE — Telephone Encounter (Signed)
Spoke to [place] on the phone about her blood sugars and her wanting to see Endocrinology. Her last A1c was 8.3. She reports that she is eating a healthy diet... Her home health nurse does not share the same sentiment. Saily reports blood sugars in the 200-250 range. She is currently taking Humalog 70/30 - 65 units in the morning and 35 at night.   I advised her to go up 5 units in the morning and 5 units at night and I will check in with her later in the week.

## 2015-10-09 DIAGNOSIS — J45909 Unspecified asthma, uncomplicated: Secondary | ICD-10-CM | POA: Diagnosis not present

## 2015-10-09 DIAGNOSIS — N183 Chronic kidney disease, stage 3 (moderate): Secondary | ICD-10-CM | POA: Diagnosis not present

## 2015-10-09 DIAGNOSIS — G4733 Obstructive sleep apnea (adult) (pediatric): Secondary | ICD-10-CM | POA: Diagnosis not present

## 2015-10-09 DIAGNOSIS — E1122 Type 2 diabetes mellitus with diabetic chronic kidney disease: Secondary | ICD-10-CM | POA: Diagnosis not present

## 2015-10-09 DIAGNOSIS — I5030 Unspecified diastolic (congestive) heart failure: Secondary | ICD-10-CM | POA: Diagnosis not present

## 2015-10-09 DIAGNOSIS — I130 Hypertensive heart and chronic kidney disease with heart failure and stage 1 through stage 4 chronic kidney disease, or unspecified chronic kidney disease: Secondary | ICD-10-CM | POA: Diagnosis not present

## 2015-10-10 ENCOUNTER — Other Ambulatory Visit: Payer: Self-pay | Admitting: Adult Health

## 2015-10-11 ENCOUNTER — Telehealth: Payer: Self-pay | Admitting: Adult Health

## 2015-10-11 NOTE — Telephone Encounter (Signed)
Spoke to [name] on the phone and she informed me that her blood sugars have dropped from 280's to 200 range. She has gone up to 65 units on her day insulin but has not gone up on her night time insulin.

## 2015-10-14 ENCOUNTER — Encounter (HOSPITAL_COMMUNITY): Payer: Self-pay | Admitting: *Deleted

## 2015-10-14 ENCOUNTER — Emergency Department (HOSPITAL_COMMUNITY): Payer: Medicare Other

## 2015-10-14 ENCOUNTER — Emergency Department (HOSPITAL_COMMUNITY)
Admission: EM | Admit: 2015-10-14 | Discharge: 2015-10-15 | Disposition: A | Discharge status: Home / Self Care | Payer: Medicare Other | Attending: Emergency Medicine | Admitting: Emergency Medicine

## 2015-10-14 DIAGNOSIS — I509 Heart failure, unspecified: Secondary | ICD-10-CM | POA: Insufficient documentation

## 2015-10-14 DIAGNOSIS — Y9389 Activity, other specified: Secondary | ICD-10-CM | POA: Diagnosis not present

## 2015-10-14 DIAGNOSIS — N3001 Acute cystitis with hematuria: Secondary | ICD-10-CM | POA: Diagnosis not present

## 2015-10-14 DIAGNOSIS — E669 Obesity, unspecified: Secondary | ICD-10-CM | POA: Insufficient documentation

## 2015-10-14 DIAGNOSIS — S40021A Contusion of right upper arm, initial encounter: Secondary | ICD-10-CM | POA: Insufficient documentation

## 2015-10-14 DIAGNOSIS — E1122 Type 2 diabetes mellitus with diabetic chronic kidney disease: Secondary | ICD-10-CM | POA: Diagnosis not present

## 2015-10-14 DIAGNOSIS — S299XXA Unspecified injury of thorax, initial encounter: Secondary | ICD-10-CM | POA: Diagnosis not present

## 2015-10-14 DIAGNOSIS — E119 Type 2 diabetes mellitus without complications: Secondary | ICD-10-CM | POA: Diagnosis not present

## 2015-10-14 DIAGNOSIS — R404 Transient alteration of awareness: Secondary | ICD-10-CM | POA: Diagnosis not present

## 2015-10-14 DIAGNOSIS — F419 Anxiety disorder, unspecified: Secondary | ICD-10-CM | POA: Diagnosis not present

## 2015-10-14 DIAGNOSIS — I10 Essential (primary) hypertension: Secondary | ICD-10-CM | POA: Insufficient documentation

## 2015-10-14 DIAGNOSIS — Z9981 Dependence on supplemental oxygen: Secondary | ICD-10-CM | POA: Diagnosis not present

## 2015-10-14 DIAGNOSIS — Z794 Long term (current) use of insulin: Secondary | ICD-10-CM | POA: Insufficient documentation

## 2015-10-14 DIAGNOSIS — Z91040 Latex allergy status: Secondary | ICD-10-CM | POA: Insufficient documentation

## 2015-10-14 DIAGNOSIS — R42 Dizziness and giddiness: Secondary | ICD-10-CM | POA: Diagnosis not present

## 2015-10-14 DIAGNOSIS — J45909 Unspecified asthma, uncomplicated: Secondary | ICD-10-CM | POA: Diagnosis not present

## 2015-10-14 DIAGNOSIS — Z87891 Personal history of nicotine dependence: Secondary | ICD-10-CM | POA: Insufficient documentation

## 2015-10-14 DIAGNOSIS — Y9289 Other specified places as the place of occurrence of the external cause: Secondary | ICD-10-CM | POA: Insufficient documentation

## 2015-10-14 DIAGNOSIS — M419 Scoliosis, unspecified: Secondary | ICD-10-CM | POA: Insufficient documentation

## 2015-10-14 DIAGNOSIS — E7800 Pure hypercholesterolemia, unspecified: Secondary | ICD-10-CM | POA: Diagnosis not present

## 2015-10-14 DIAGNOSIS — N183 Chronic kidney disease, stage 3 (moderate): Secondary | ICD-10-CM | POA: Diagnosis not present

## 2015-10-14 DIAGNOSIS — Z79899 Other long term (current) drug therapy: Secondary | ICD-10-CM | POA: Diagnosis not present

## 2015-10-14 DIAGNOSIS — Y998 Other external cause status: Secondary | ICD-10-CM | POA: Diagnosis not present

## 2015-10-14 DIAGNOSIS — I5030 Unspecified diastolic (congestive) heart failure: Secondary | ICD-10-CM | POA: Diagnosis not present

## 2015-10-14 DIAGNOSIS — W1839XA Other fall on same level, initial encounter: Secondary | ICD-10-CM | POA: Diagnosis not present

## 2015-10-14 DIAGNOSIS — R55 Syncope and collapse: Secondary | ICD-10-CM | POA: Diagnosis present

## 2015-10-14 DIAGNOSIS — G4733 Obstructive sleep apnea (adult) (pediatric): Secondary | ICD-10-CM | POA: Insufficient documentation

## 2015-10-14 DIAGNOSIS — I130 Hypertensive heart and chronic kidney disease with heart failure and stage 1 through stage 4 chronic kidney disease, or unspecified chronic kidney disease: Secondary | ICD-10-CM | POA: Diagnosis not present

## 2015-10-14 DIAGNOSIS — M79641 Pain in right hand: Secondary | ICD-10-CM | POA: Diagnosis not present

## 2015-10-14 HISTORY — DX: Heart failure, unspecified: I50.9

## 2015-10-14 LAB — URINE MICROSCOPIC-ADD ON: RBC / HPF: NONE SEEN RBC/hpf (ref 0–5)

## 2015-10-14 LAB — URINALYSIS, ROUTINE W REFLEX MICROSCOPIC
Bilirubin Urine: NEGATIVE
Bilirubin Urine: NEGATIVE
Glucose, UA: NEGATIVE mg/dL
Glucose, UA: NEGATIVE mg/dL
Hgb urine dipstick: NEGATIVE
Ketones, ur: NEGATIVE mg/dL
Ketones, ur: NEGATIVE mg/dL
Nitrite: NEGATIVE
Nitrite: POSITIVE — AB
Protein, ur: NEGATIVE mg/dL
Protein, ur: NEGATIVE mg/dL
Specific Gravity, Urine: 1.007 (ref 1.005–1.030)
Specific Gravity, Urine: 1.01 (ref 1.005–1.030)
pH: 6.5 (ref 5.0–8.0)
pH: 5.5 (ref 5.0–8.0)

## 2015-10-14 LAB — BASIC METABOLIC PANEL
Anion gap: 16 — ABNORMAL HIGH (ref 5–15)
BUN: 52 mg/dL — ABNORMAL HIGH (ref 6–20)
CO2: 27 mmol/L (ref 22–32)
Calcium: 9.9 mg/dL (ref 8.9–10.3)
Chloride: 92 mmol/L — ABNORMAL LOW (ref 101–111)
Creatinine, Ser: 2.34 mg/dL — ABNORMAL HIGH (ref 0.44–1.00)
GFR calc Af Amer: 22 mL/min — ABNORMAL LOW (ref >60)
GFR calc non Af Amer: 19 mL/min — ABNORMAL LOW (ref >60)
Glucose, Bld: 174 mg/dL — ABNORMAL HIGH (ref 65–99)
Potassium: 4.3 mmol/L (ref 3.5–5.1)
Sodium: 135 mmol/L (ref 135–145)

## 2015-10-14 LAB — CBC
HCT: 33.9 % — ABNORMAL LOW (ref 36.0–46.0)
Hemoglobin: 11.2 g/dL — ABNORMAL LOW (ref 12.0–15.0)
MCH: 29.6 pg (ref 26.0–34.0)
MCHC: 33 g/dL (ref 30.0–36.0)
MCV: 89.4 fL (ref 78.0–100.0)
Platelets: 144 10*3/uL — ABNORMAL LOW (ref 150–400)
RBC: 3.79 MIL/uL — ABNORMAL LOW (ref 3.87–5.11)
RDW: 17.2 % — ABNORMAL HIGH (ref 11.5–15.5)
WBC: 5.6 10*3/uL (ref 4.0–10.5)

## 2015-10-14 LAB — I-STAT TROPONIN, ED: Troponin i, poc: 0.02 ng/mL (ref 0.00–0.08)

## 2015-10-14 MED ORDER — FOSFOMYCIN TROMETHAMINE 3 G PO PACK: PACK | ORAL | Status: DC

## 2015-10-14 MED ORDER — FOSFOMYCIN TROMETHAMINE 3 G PO PACK
3.0000 g | PACK | Freq: Once | ORAL | Status: AC
  Administered 2015-10-15: 3 g via ORAL
  Filled 2015-10-14: qty 3

## 2015-10-14 NOTE — Discharge Instructions (Signed)
Follow up with your urologist.   Urinary Tract Infection Urinary tract infections (UTIs) can develop anywhere along your urinary tract. Your urinary tract is your body's drainage system for removing wastes and extra water. Your urinary tract includes two kidneys, two ureters, a bladder, and a urethra. Your kidneys are a pair of bean-shaped organs. Each kidney is about the size of your fist. They are located below your ribs, one on each side of your spine. CAUSES Infections are caused by microbes, which are microscopic organisms, including fungi, viruses, and bacteria. These organisms are so small that they can only be seen through a microscope. Bacteria are the microbes that most commonly cause UTIs. SYMPTOMS  Symptoms of UTIs may vary by age and gender of the patient and by the location of the infection. Symptoms in young women typically include a frequent and intense urge to urinate and a painful, burning feeling in the bladder or urethra during urination. Older women and men are more likely to be tired, shaky, and weak and have muscle aches and abdominal pain. A fever may mean the infection is in your kidneys. Other symptoms of a kidney infection include pain in your back or sides below the ribs, nausea, and vomiting. DIAGNOSIS To diagnose a UTI, your caregiver will ask you about your symptoms. Your caregiver will also ask you to provide a urine sample. The urine sample will be tested for bacteria and white blood cells. White blood cells are made by your body to help fight infection. TREATMENT  Typically, UTIs can be treated with medication. Because most UTIs are caused by a bacterial infection, they usually can be treated with the use of antibiotics. The choice of antibiotic and length of treatment depend on your symptoms and the type of bacteria causing your infection. HOME CARE INSTRUCTIONS  If you were prescribed antibiotics, take them exactly as your caregiver instructs you. Finish the  medication even if you feel better after you have only taken some of the medication.  Drink enough water and fluids to keep your urine clear or pale yellow.  Avoid caffeine, tea, and carbonated beverages. They tend to irritate your bladder.  Empty your bladder often. Avoid holding urine for long periods of time.  Empty your bladder before and after sexual intercourse.  After a bowel movement, women should cleanse from front to back. Use each tissue only once. SEEK MEDICAL CARE IF:   You have back pain.  You develop a fever.  Your symptoms do not begin to resolve within 3 days. SEEK IMMEDIATE MEDICAL CARE IF:   You have severe back pain or lower abdominal pain.  You develop chills.  You have nausea or vomiting.  You have continued burning or discomfort with urination. MAKE SURE YOU:   Understand these instructions.  Will watch your condition.  Will get help right away if you are not doing well or get worse.   This information is not intended to replace advice given to you by your health care provider. Make sure you discuss any questions you have with your health care provider.   Document Released: 06/03/2005 Document Revised: 05/15/2015 Document Reviewed: 10/02/2011 Elsevier Interactive Patient Education [employer].

## 2015-10-14 NOTE — ED Notes (Signed)
Pt was walking with her walker and became dizzy while opening the fridge door.  She tried to sit on her walker the breaks weren't off, so she fell on R arm.  Denies loc.  Pt states that last Tues she experienced a similar episode of weakness.  EMS EKG irregular.  CBG 176.  BP 0000000 systolic.

## 2015-10-14 NOTE — ED Provider Notes (Signed)
CSN: [id number]     Arrival date & time 10/14/15  1755 History   First MD Initiated Contact with Patient 10/14/15 1800     Chief Complaint  Patient presents with  . Near Syncope   HPI   [name] is a 81 y.o. female PMH significant for hyperlipidemia, hypertension, diabetes type 2, extensive kidney surgeries (7) CHF presenting with a few month history of lightheadedness. She became lightheaded today when she was opening the fridge door. She had a mechanical fall onto her right arm after trying to sit down on her walker but the brakes were off. She states her dizzy episodes are worsened by position changes (sitting to standing), non-vertiginous, last a few minutes, and occurred after she was told to stop taking Losartan. She denies fevers, chills, hematochezia, CP, abdominal pain, SOB, LOC, cough, head injury, pain elsewhere, palpitations, changes in bowel/bladder habits or urinary symptoms.   Past Medical History  Diagnosis Date  . Pure hypercholesterolemia   . Unspecified essential hypertension   . Syncope and collapse   . Type II or unspecified type diabetes mellitus without mention of complication, not stated as uncontrolled   . Depressive disorder, not elsewhere classified   . Extrinsic asthma, unspecified     no problem since adulthood  . Obesity   . Anxiety state, unspecified     panic attacks  . OSA on CPAP     severe  . Allergic rhinitis   . Respiratory failure with hypoxia ([place]) 09/2008    acute, secondary to multiple bilateral pulmonary embolism , negative hypercoagulable workup 09/2008 hospital stay  . Scoliosis   . CHF (congestive heart failure) ([place])   . Unspecified hypothyroidism     hypo   Past Surgical History  Procedure Laterality Date  . Thyroid surgery    . Knee replaced    . Eye surgery    . Joint replacement     Family History  Problem Relation Age of Onset  . Arthritis Other   . Diabetes Other   . Hyperlipidemia Other   . Hypertension Other   .  Coronary artery disease Other   . Stroke Other   . Allergies Mother   . Clotting disorder Mother   . Osteoarthritis Mother   . Asthma Mother   . Osteoarthritis Daughter   . Rheum arthritis Maternal Grandmother   . Clotting disorder Maternal Grandmother   . Clotting disorder Maternal Uncle   . Clotting disorder Daughter   . Allergies Daughter    Social History  Substance Use Topics  . Smoking status: Former Smoker -- 20 years    Quit date: 09/07/1989  . Smokeless tobacco: None  . Alcohol Use: No   OB History    No data available     Review of Systems  Ten systems are reviewed and are negative for acute change except as noted in the HPI  Allergies  Aspirin; Contrast media; Flagyl; Fluticasone-salmeterol; Lactose intolerance (gi); Latex; Lisinopril; Metformin; Pineapple; Sulfonamide derivatives; and Tetracycline  Home Medications   Prior to Admission medications   Medication Sig Start Date End Date Taking? Authorizing Provider  atorvastatin (LIPITOR) 40 MG tablet Take 1 tablet (40 mg total) by mouth daily. 09/17/15   Dorothyann Peng, NP  Coenzyme Q10 200 MG capsule Take 200 mg by mouth daily.      Historical Provider, MD  dextromethorphan (DELSYM) 30 MG/5ML liquid Take 30 mg by mouth 2 (two) times daily as needed for cough.    Historical  Provider, MD  fenofibrate 54 MG tablet Take 1 tablet (54 mg total) by mouth daily. 06/12/15   Dorothyann Peng, NP  fexofenadine (ALLEGRA) 180 MG tablet Take 180 mg by mouth daily.      Historical Provider, MD  furosemide (LASIX) 20 MG tablet Take 2 tablets (40 mg total) by mouth daily. 08/29/15   Nita Sells, MD  HUMULIN 70/30 (70-30) 100 UNIT/ML injection INJECT 65 UNITS UNDER THE SKIN AS DIRECTED EVERY MORNING AND 30 UNITS EVERY EVENING 08/26/15   Dorothyann Peng, NP  Insulin Pen Needle (B-D UF III MINI PEN NEEDLES) 31G X 5 MM MISC Use daily for insulin injection 01/05/11   Doe-Hyun R Shawna Orleans, DO  levothyroxine (SYNTHROID, LEVOTHROID) 112 MCG  tablet take one by mouth daily 03/21/15   Historical Provider, MD  losartan (COZAAR) 25 MG tablet TAKE 1 TABLET BY MOUTH EVERY DAY 10/10/15   Dorothyann Peng, NP  Multiple Vitamin (MULTIVITAMIN) tablet Take 1 tablet by mouth daily.      Historical Provider, MD  nystatin (MYCOSTATIN) 100000 UNIT/ML suspension Take 5 mLs (500,000 Units total) by mouth 4 (four) times daily. 09/04/15   Dorothyann Peng, NP  oxybutynin (DITROPAN) 5 MG tablet Take 5 mg by mouth daily.     Historical Provider, MD  oxyCODONE (OXY IR/ROXICODONE) 5 MG immediate release tablet Take 5 mg by mouth every 4 (four) hours as needed for severe pain.    Historical Provider, MD  potassium chloride SA (K-DUR,KLOR-CON) 20 MEQ tablet Takes 3 tablets by mouth daily 08/29/15   Historical Provider, MD  Probiotic Product (PROBIOTIC PO) Take 1 capsule by mouth daily.    Historical Provider, MD  ranitidine (ZANTAC) 150 MG capsule Take 150 mg by mouth 2 (two) times daily as needed for heartburn.     Historical Provider, MD  sertraline (ZOLOFT) 50 MG tablet Take 50 mg by mouth daily.    Historical Provider, MD  tamsulosin (FLOMAX) 0.4 MG CAPS capsule Take 0.4 mg by mouth daily. 01/22/15   Historical Provider, MD  tiZANidine (ZANAFLEX) 4 MG tablet Take 1 tablet (4 mg total) by mouth every 6 (six) hours as needed for muscle spasms. 05/08/15   Dorothyann Peng, NP   Ht 5\' 2"  (1.575 m)  Wt 106.595 kg  BMI 42.97 kg/m2 Physical Exam  Constitutional: She is oriented to person, place, and time. She appears well-developed and well-nourished. No distress.  Obese, chronically ill-appearing.   HENT:  Head: Normocephalic and atraumatic.  Right Ear: External ear normal.  Left Ear: External ear normal.  Nose: Nose normal.  Mouth/Throat: Oropharynx is clear and moist. No oropharyngeal exudate.  Eyes: Conjunctivae are normal. Pupils are equal, round, and reactive to light. Right eye exhibits no discharge. Left eye exhibits no discharge. No scleral icterus.  Neck:  Normal range of motion. Neck supple. No tracheal deviation present.  Cardiovascular: Normal rate, regular rhythm, normal heart sounds and intact distal pulses.  Exam reveals no gallop and no friction rub.   No murmur heard. Pulmonary/Chest: Effort normal and breath sounds normal. No respiratory distress. She has no wheezes. She has no rales. She exhibits no tenderness.  Abdominal: Soft. Bowel sounds are normal. She exhibits no distension and no mass. There is no tenderness. There is no rebound and no guarding.  No CVA tenderness  Musculoskeletal: Normal range of motion. She exhibits edema. She exhibits no tenderness.  1+ edema BL LE. Neurovascularly intact.  Right arm without tenderness, deformity, ecchymosis.   Lymphadenopathy:    She has  no cervical adenopathy.  Neurological: She is alert and oriented to person, place, and time. No cranial nerve deficit. Coordination normal.  Skin: Skin is warm and dry. No rash noted. She is not diaphoretic. No erythema.  Psychiatric: She has a normal mood and affect. Her behavior is normal.  Nursing note and vitals reviewed.   ED Course  Procedures  Labs Review Labs Reviewed  CBC - Abnormal; Notable for the following:    RBC 3.79 (*)    Hemoglobin 11.2 (*)    HCT 33.9 (*)    RDW 17.2 (*)    Platelets 144 (*)    All other components within normal limits  BASIC METABOLIC PANEL  URINALYSIS, ROUTINE W REFLEX MICROSCOPIC (NOT AT [facility])  Randolm Idol, ED  CBG MONITORING, ED   Imaging Review Dg Chest 2 View  10/14/2015  CLINICAL DATA:  Lightheadedness.  Fall. EXAM: CHEST  2 VIEW COMPARISON:  08/26/2015 FINDINGS: Mild cardiomegaly. No confluent airspace opacities, effusions or edema. No acute bony abnormality. IMPRESSION: Cardiomegaly.  No active disease. Electronically Signed   By: Rolm Baptise M.D.   On: 10/14/2015 19:38   I have personally reviewed and evaluated these images and lab results as part of my medical decision-making.   EKG  Interpretation   Date/Time:  Monday October 14 2015 18:05:49 EST Ventricular Rate:  75 PR Interval:  185 QRS Duration: 116 QT Interval:  436 QTC Calculation: 487 R Axis:   74 Text Interpretation:  Sinus rhythm Nonspecific intraventricular conduction  delay Minimal ST elevation, anterior leads No significant change since  last tracing Confirmed by LITTLE MD, RACHEL [id number]) on 10/14/2015 8:47:02 PM      MDM   Final diagnoses:  Acute cystitis with hematuria   Patient non-toxic appearing and VSS. Neuro exam unremarkable. Will perform broad workup for dizziness for cardiac vs infectious. No imaging indicated for right arm.   BMP for hyperglycemia of 174, SCr 2.34 (baseline).  CBC, CXR, EKG, troponin unremarkable for acute change.  Performed first UA with possible contamination, and as patient has had extensive kidney surgeries and UTIs, will cath for another UA and to send for culture. Nitrite positive UTI on second UA.    Medications  fosfomycin (MONUROL) packet 3 g (3 g Oral Given 10/15/15 0002)   Discussed with pharmacy how to treat her UTI, as she is allergic to sulfa, and last urine culture was only susceptible to bactrim.  Patient requesting to leave and stated she was ready for discharge at 4 pm. She states "I was told by my family doctor not to come to [facility] and I did anyway. I am ready to go home."  She has the capacity to make these decisions, so will treat for UTI.  Patient may be safely discharged home with  Discharge Medication List as of 10/14/2015 11:57 PM    START taking these medications   Details  fosfomycin (MONUROL) 3 g PACK Take 1 packet on 10/17/15 then second packet on 10/20/15, Print       Discussed reasons for return. Patient to follow-up with primary care provider within one week. Patient in understanding and agreement with the plan.   [place], PA-C 10/16/15 [place], MD 10/19/15 [phone]

## 2015-10-15 ENCOUNTER — Telehealth: Payer: Self-pay | Admitting: Adult Health

## 2015-10-15 DIAGNOSIS — N3001 Acute cystitis with hematuria: Secondary | ICD-10-CM | POA: Diagnosis not present

## 2015-10-15 NOTE — Telephone Encounter (Signed)
Martyn Ehrich w/[facility] would like for the PCP to know that the patient was dizzy and fell yesterday evening.  EMS took her to [facility] and she was treated for an UTI.

## 2015-10-17 LAB — URINE CULTURE: Culture: >=100000

## 2015-10-18 ENCOUNTER — Telehealth: Payer: Self-pay | Admitting: Adult Health

## 2015-10-18 DIAGNOSIS — I5030 Unspecified diastolic (congestive) heart failure: Secondary | ICD-10-CM | POA: Diagnosis not present

## 2015-10-18 DIAGNOSIS — E1122 Type 2 diabetes mellitus with diabetic chronic kidney disease: Secondary | ICD-10-CM | POA: Diagnosis not present

## 2015-10-18 DIAGNOSIS — I130 Hypertensive heart and chronic kidney disease with heart failure and stage 1 through stage 4 chronic kidney disease, or unspecified chronic kidney disease: Secondary | ICD-10-CM | POA: Diagnosis not present

## 2015-10-18 DIAGNOSIS — N183 Chronic kidney disease, stage 3 (moderate): Secondary | ICD-10-CM | POA: Diagnosis not present

## 2015-10-18 DIAGNOSIS — J45909 Unspecified asthma, uncomplicated: Secondary | ICD-10-CM | POA: Diagnosis not present

## 2015-10-18 DIAGNOSIS — G4733 Obstructive sleep apnea (adult) (pediatric): Secondary | ICD-10-CM | POA: Diagnosis not present

## 2015-10-18 NOTE — Telephone Encounter (Signed)
That is fine 

## 2015-10-18 NOTE — Telephone Encounter (Signed)
Pt has been scheduled.  °

## 2015-10-18 NOTE — Telephone Encounter (Signed)
lmom for pt to call back

## 2015-10-18 NOTE — Telephone Encounter (Signed)
Post ED Visit - Positive Culture Follow-up  Culture report reviewed by antimicrobial stewardship pharmacist:  [x]  Elenor Quinones, Pharm.D. []  Heide Guile, Pharm.D., BCPS []  Parks Neptune, Pharm.D. []  Alycia Rossetti, Pharm.D., BCPS []  John Day, [place].D., BCPS, AAHIVP []  Legrand Como, Pharm.D., BCPS, AAHIVP []  Milus Glazier, Pharm.D. []  Stephens November, [place].D.  Positive urine culture E. coli Treated with fosfomycin, organism sensitive to the same and no further patient follow-up is required at this time.  [name] 10/18/2015, 9:04 AM

## 2015-10-18 NOTE — Telephone Encounter (Signed)
Please schedule pt for Hospital f/u for UTI/Fall

## 2015-10-18 NOTE — Telephone Encounter (Signed)
Pt has a pending appt on 12/10/2014 for chronic follow-up. I am going to have [place] reschedule this pt sooner for ER follow-up discuss fall/uti. This is an FYI to you if you can sent it back once you have read. Thanks.

## 2015-10-18 NOTE — Telephone Encounter (Signed)
FYI Pt had fall on Monday and went to er dx with uti. Pt had another fall yesterday. Pt has a little abrasion on her knee

## 2015-10-21 DIAGNOSIS — J45909 Unspecified asthma, uncomplicated: Secondary | ICD-10-CM | POA: Diagnosis not present

## 2015-10-21 DIAGNOSIS — I5030 Unspecified diastolic (congestive) heart failure: Secondary | ICD-10-CM | POA: Diagnosis not present

## 2015-10-21 DIAGNOSIS — G4733 Obstructive sleep apnea (adult) (pediatric): Secondary | ICD-10-CM | POA: Diagnosis not present

## 2015-10-21 DIAGNOSIS — E1122 Type 2 diabetes mellitus with diabetic chronic kidney disease: Secondary | ICD-10-CM | POA: Diagnosis not present

## 2015-10-21 DIAGNOSIS — N183 Chronic kidney disease, stage 3 (moderate): Secondary | ICD-10-CM | POA: Diagnosis not present

## 2015-10-21 DIAGNOSIS — I130 Hypertensive heart and chronic kidney disease with heart failure and stage 1 through stage 4 chronic kidney disease, or unspecified chronic kidney disease: Secondary | ICD-10-CM | POA: Diagnosis not present

## 2015-10-21 NOTE — Telephone Encounter (Signed)
Pending 10/25/2015.

## 2015-10-22 ENCOUNTER — Telehealth: Payer: Self-pay | Admitting: Adult Health

## 2015-10-22 MED ORDER — OXYBUTYNIN CHLORIDE 5 MG PO TABS: 5.0000 mg | ORAL_TABLET | Freq: Three times a day (TID) | ORAL | Status: DC

## 2015-10-22 MED ORDER — FUROSEMIDE 20 MG PO TABS: 40.0000 mg | ORAL_TABLET | Freq: Every day | ORAL | Status: DC

## 2015-10-22 NOTE — Telephone Encounter (Signed)
Rx sent to pharmacy   

## 2015-10-22 NOTE — Telephone Encounter (Signed)
Needs a verbal authorization on meds due to being a new patient at the pharmacy.  [place]  Furosemide   [employer]

## 2015-10-23 ENCOUNTER — Telehealth: Payer: Self-pay | Admitting: Adult Health

## 2015-10-23 DIAGNOSIS — I130 Hypertensive heart and chronic kidney disease with heart failure and stage 1 through stage 4 chronic kidney disease, or unspecified chronic kidney disease: Secondary | ICD-10-CM | POA: Diagnosis not present

## 2015-10-23 DIAGNOSIS — I5030 Unspecified diastolic (congestive) heart failure: Secondary | ICD-10-CM | POA: Diagnosis not present

## 2015-10-23 DIAGNOSIS — G4733 Obstructive sleep apnea (adult) (pediatric): Secondary | ICD-10-CM | POA: Diagnosis not present

## 2015-10-23 DIAGNOSIS — J45909 Unspecified asthma, uncomplicated: Secondary | ICD-10-CM | POA: Diagnosis not present

## 2015-10-23 DIAGNOSIS — N183 Chronic kidney disease, stage 3 (moderate): Secondary | ICD-10-CM | POA: Diagnosis not present

## 2015-10-23 DIAGNOSIS — E1122 Type 2 diabetes mellitus with diabetic chronic kidney disease: Secondary | ICD-10-CM | POA: Diagnosis not present

## 2015-10-23 NOTE — Telephone Encounter (Signed)
Chris brown from [facility] would like to get pt recertified for 60 days due to falls. Verbal order is ok

## 2015-10-23 NOTE — Telephone Encounter (Signed)
Left a message for return call.  

## 2015-10-23 NOTE — Telephone Encounter (Signed)
Called and spoke with Gerald Stabs and she is aware.  She is concerned about pt's heart rate; at times it drops to 45 and would like [place] know.

## 2015-10-23 NOTE — Telephone Encounter (Signed)
That is fine 

## 2015-10-25 ENCOUNTER — Ambulatory Visit (INDEPENDENT_AMBULATORY_CARE_PROVIDER_SITE_OTHER): Payer: Medicare Other | Admitting: Adult Health

## 2015-10-25 ENCOUNTER — Encounter: Payer: Self-pay | Admitting: Adult Health

## 2015-10-25 VITALS — BP 110/74 | HR 65 | Temp 98.1°F

## 2015-10-25 DIAGNOSIS — I2510 Atherosclerotic heart disease of native coronary artery without angina pectoris: Secondary | ICD-10-CM | POA: Diagnosis not present

## 2015-10-25 DIAGNOSIS — IMO0002 Reserved for concepts with insufficient information to code with codable children: Secondary | ICD-10-CM

## 2015-10-25 DIAGNOSIS — N183 Chronic kidney disease, stage 3 unspecified: Secondary | ICD-10-CM

## 2015-10-25 DIAGNOSIS — Z794 Long term (current) use of insulin: Secondary | ICD-10-CM

## 2015-10-25 DIAGNOSIS — Z09 Encounter for follow-up examination after completed treatment for conditions other than malignant neoplasm: Secondary | ICD-10-CM | POA: Diagnosis not present

## 2015-10-25 DIAGNOSIS — R42 Dizziness and giddiness: Secondary | ICD-10-CM

## 2015-10-25 DIAGNOSIS — E1122 Type 2 diabetes mellitus with diabetic chronic kidney disease: Secondary | ICD-10-CM

## 2015-10-25 DIAGNOSIS — E1165 Type 2 diabetes mellitus with hyperglycemia: Secondary | ICD-10-CM | POA: Diagnosis not present

## 2015-10-25 DIAGNOSIS — I2584 Coronary atherosclerosis due to calcified coronary lesion: Secondary | ICD-10-CM | POA: Diagnosis not present

## 2015-10-25 DIAGNOSIS — R8299 Other abnormal findings in urine: Secondary | ICD-10-CM | POA: Diagnosis not present

## 2015-10-25 DIAGNOSIS — R8290 Unspecified abnormal findings in urine: Secondary | ICD-10-CM | POA: Diagnosis not present

## 2015-10-25 DIAGNOSIS — N3281 Overactive bladder: Secondary | ICD-10-CM

## 2015-10-25 LAB — CBC WITH DIFFERENTIAL/PLATELET
Basophils Absolute: 0 10*3/uL (ref 0.0–0.1)
Basophils Relative: 0.4 % (ref 0.0–3.0)
Eosinophils Absolute: 0.1 10*3/uL (ref 0.0–0.7)
Eosinophils Relative: 2.7 % (ref 0.0–5.0)
HCT: 33.2 % — ABNORMAL LOW (ref 36.0–46.0)
Hemoglobin: 11.3 g/dL — ABNORMAL LOW (ref 12.0–15.0)
Lymphocytes Relative: 18.7 % (ref 12.0–46.0)
Lymphs Abs: 1 10*3/uL (ref 0.7–4.0)
MCHC: 34.1 g/dL (ref 30.0–36.0)
MCV: 89.2 fl (ref 78.0–100.0)
Monocytes Absolute: 0.3 10*3/uL (ref 0.1–1.0)
Monocytes Relative: 6.4 % (ref 3.0–12.0)
Neutro Abs: 3.7 10*3/uL (ref 1.4–7.7)
Neutrophils Relative %: 71.8 % (ref 43.0–77.0)
Platelets: 160 10*3/uL (ref 150.0–400.0)
RBC: 3.72 Mil/uL — ABNORMAL LOW (ref 3.87–5.11)
RDW: 18 % — ABNORMAL HIGH (ref 11.5–15.5)
WBC: 5.1 10*3/uL (ref 4.0–10.5)

## 2015-10-25 LAB — POCT URINALYSIS DIPSTICK
Bilirubin, UA: NEGATIVE
Blood, UA: NEGATIVE
Glucose, UA: NEGATIVE
Ketones, UA: NEGATIVE
Nitrite, UA: NEGATIVE
Protein, UA: NEGATIVE
Spec Grav, UA: 1.01
Urobilinogen, UA: 0.2
pH, UA: 5.5

## 2015-10-25 LAB — BASIC METABOLIC PANEL
BUN: 56 mg/dL — ABNORMAL HIGH (ref 6–23)
CO2: 31 mEq/L (ref 19–32)
Calcium: 10.4 mg/dL (ref 8.4–10.5)
Chloride: 98 mEq/L (ref 96–112)
Creatinine, Ser: 2.09 mg/dL — ABNORMAL HIGH (ref 0.40–1.20)
GFR: 24.33 mL/min — ABNORMAL LOW (ref >60.00)
Glucose, Bld: 227 mg/dL — ABNORMAL HIGH (ref 70–99)
Potassium: 4.2 mEq/L (ref 3.5–5.1)
Sodium: 138 mEq/L (ref 135–145)

## 2015-10-25 MED ORDER — MIRABEGRON ER 50 MG PO TB24: 50.0000 mg | ORAL_TABLET | Freq: Every day | ORAL | Status: DC

## 2015-10-25 NOTE — Patient Instructions (Addendum)
It was great seeing you again.   As discussed  1. Discontinue Oxybutyin and start Mybetriq  2. Get up slowly  3. Have PT call us on Monday so we can give them verbal orders for more PT 4. Add a little more salt to your diet.  5. Stay well hydrated.    We will talk this week and see how everything is going.

## 2015-10-25 NOTE — Progress Notes (Signed)
Subjective:    Patient ID: [name], female    DOB: 07-24-1935, 81 y.o.   MRN: [id number]  HPI  81 year old female with pmh including kidney infections requiring surgery, CHF, type 2 diabetes,  who presents to the office today for ER follow up. She was seen in the ER on 10/14/2015 S/p fall while in the ER she was found to have a urinary tract infection and was started on fosfomycin, malleolus medication was too hard on her stomach and her nephrologist started her on Cipro, which she has since finished. Maysie endorses fallen 3 times in the last month each time due to what she perceives as dizziness. She endorses that she is using her walker whenever she is up moving around and all of a sudden she'll become dizzy she places herself on the ground but is unable to get up without assistance. She denies any type of syncopal episode and has not experienced any LOC. Further she denies any injuries from her falls  Denies taking prescribed Cozaar 25 mg. She has upped her Humulin 70/30 to 50 units in the evening and 65 units in the morning. He reports that her blood sugars are starting to become more well controlled currently they fluctuate in the 140s to 180 range.   Today in the office she had a dizzy spell which required her to be wheeled back into the exam room in a wheelchair. A few minutes she was no longer dizzy. At this time her blood pressure is 90/58  Review of Systems  Constitutional: Negative.   Respiratory: Negative.   Cardiovascular: Negative.   Gastrointestinal: Negative.   Skin: Negative.   Neurological: Positive for dizziness. Negative for weakness, light-headedness and headaches.  All other systems reviewed and are negative.  Past Medical History  Diagnosis Date  . Pure hypercholesterolemia   . Unspecified essential hypertension   . Syncope and collapse   . Type II or unspecified type diabetes mellitus without mention of complication, not stated as uncontrolled   .  Depressive disorder, not elsewhere classified   . Extrinsic asthma, unspecified     no problem since adulthood  . Obesity   . Anxiety state, unspecified     panic attacks  . OSA on CPAP     severe  . Allergic rhinitis   . Respiratory failure with hypoxia ([place]) 09/2008    acute, secondary to multiple bilateral pulmonary embolism , negative hypercoagulable workup 09/2008 hospital stay  . Scoliosis   . CHF (congestive heart failure) ([place])   . Unspecified hypothyroidism     hypo    Social History   Social History  . Marital Status: Widowed    Spouse Name: N/A  . Number of Children: 2  . Years of Education: N/A   Occupational History  . 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 Smoking status: Former Smoker -- 20 years    Quit date: 09/07/1989  . Smokeless tobacco: Not on file  . Alcohol Use: No  . Drug Use: No  . Sexual Activity: Not on file   Other Topics Concern  . Not on file   Social History Narrative    Past Surgical History  Procedure Laterality Date  . Thyroid surgery    . Knee replaced    . Eye surgery    . Joint replacement      Family History  Problem Relation Age of Onset  .  Arthritis Other   . Diabetes Other   . Hyperlipidemia Other   . Hypertension Other   . Coronary artery disease Other   . Stroke Other   . Allergies Mother   . Clotting disorder Mother   . Osteoarthritis Mother   . Asthma Mother   . Osteoarthritis Daughter   . Rheum arthritis Maternal Grandmother   . Clotting disorder Maternal Grandmother   . Clotting disorder Maternal Uncle   . Clotting disorder Daughter   . Allergies Daughter     Allergies  Allergen Reactions  . Metformin Diarrhea  . Pineapple Swelling    Throat swells and blisters on tongue and roof of mouth per patient  . Tetracycline Hives  . Aspirin Itching and Rash  . Contrast Media [Iodinated Diagnostic Agents] Itching       . Flagyl [Metronidazole] Other (See  Comments)    Unknown   . Fluticasone-Salmeterol Itching and Rash  . Lactose Intolerance (Gi) Other (See Comments)    gas  . Latex Itching and Rash  . Lisinopril Cough  . Sulfonamide Derivatives Itching and Rash    Current Outpatient Prescriptions on File Prior to Visit  Medication Sig Dispense Refill  . atorvastatin (LIPITOR) 40 MG tablet Take 1 tablet (40 mg total) by mouth daily. 90 tablet 3  . Coenzyme Q10 200 MG capsule Take 200 mg by mouth daily.      . fenofibrate 54 MG tablet Take 1 tablet (54 mg total) by mouth daily. 30 tablet 6  . fexofenadine (ALLEGRA) 180 MG tablet Take 180 mg by mouth daily.      . furosemide (LASIX) 20 MG tablet Take 2 tablets (40 mg total) by mouth daily. 90 tablet 0  . HUMULIN 70/30 (70-30) 100 UNIT/ML injection INJECT 65 UNITS UNDER THE SKIN AS DIRECTED EVERY MORNING AND 30 UNITS EVERY EVENING (Patient taking differently: INJECT 70 UNITS UNDER THE SKIN AS DIRECTED EVERY MORNING AND 65 UNITS EVERY EVENING) 30 mL 3  . Insulin Pen Needle (B-D UF III MINI PEN NEEDLES) 31G X 5 MM MISC Use daily for insulin injection 100 each 2  . levothyroxine (SYNTHROID, LEVOTHROID) 112 MCG tablet take one tablet by mouth daily  2  . Multiple Vitamin (MULTIVITAMIN) tablet Take 1 tablet by mouth daily.      . potassium citrate (UROCIT-K) 10 MEQ (1080 MG) SR tablet Take 10 mEq by mouth 3 (three) times daily with meals.    . Probiotic Product (PROBIOTIC PO) Take 1 capsule by mouth daily.    . sertraline (ZOLOFT) 50 MG tablet Take 50 mg by mouth daily.    . tamsulosin (FLOMAX) 0.4 MG CAPS capsule Take 0.4 mg by mouth daily.  3  . tiZANidine (ZANAFLEX) 4 MG tablet Take 1 tablet (4 mg total) by mouth every 6 (six) hours as needed for muscle spasms. (Patient not taking: Reported on 10/25/2015) 30 tablet 0   No current facility-administered medications on file prior to visit.    BP 90/58 mmHg  Pulse 65  Temp(Src) 98.1 F (36.7 C) (Oral)  SpO2 95%       Objective:    Physical Exam  Constitutional: She is oriented to person, place, and time. She appears well-developed and well-nourished. No distress.  HENT:  Head: Normocephalic and atraumatic.  Right Ear: External ear normal.  Left Ear: External ear normal.  Nose: Nose normal.  Mouth/Throat: Oropharynx is clear and moist. No oropharyngeal exudate.  Eyes: Conjunctivae and EOM are normal. Pupils are equal, round,  and reactive to light. Right eye exhibits no discharge. Left eye exhibits no discharge. No scleral icterus.  Cardiovascular: Normal rate, regular rhythm, normal heart sounds and intact distal pulses.  Exam reveals no gallop and no friction rub.   No murmur heard. Pulmonary/Chest: Effort normal and breath sounds normal. No respiratory distress. She has no wheezes. She has no rales. She exhibits no tenderness.  Neurological: She is alert and oriented to person, place, and time.  She was able to stand from a seated position in wheelchair upon standing she did not become dizzy  Skin: Skin is warm and dry. No rash noted. She is not diaphoretic. No erythema. No pallor.  Psychiatric: She has a normal mood and affect. Her behavior is normal. Judgment and thought content normal.  Nursing note and vitals reviewed.      Assessment & Plan:  1. Dizziness - She is no longer taking any antihypertensive medications besides Lasix. She was continuing to take oxybutynin 3 times a day. I will just continue this in favor of myrbetriq milligrams daily, due to cholinergic effect of oxybutynin. -May be due to hypovolemic state, endorsed 2 L fluid intake daily - Basic metabolic panel - CBC with Differential/Platelet - POCT urinalysis dipstick -Follow-up with in 6 days to see how she is doing -He is currently getting physical therapy at home, this comes to an and in one week. I'm going to recommend that she continue with physical therapy for gait training  2. Abnormal urine - POCT urinalysis dipstick - Urine  culture  3. Hospital discharge follow-up - Basic metabolic panel  4. Uncontrolled type 2 diabetes mellitus with stage 3 chronic kidney disease, with long-term current use of insulin (HCC) -Continue with current insulin dosing. I would be happy with blood sugars in the 140 on a consistent basis. Afraid that dropping her too much currently will increase her dizziness subsequently making her more prone to falls. - Basic metabolic panel  5. Overactive bladder -Discontinue oxybutynin - mirabegron ER (MYRBETRIQ) 50 MG TB24 tablet; Take 1 tablet (50 mg total) by mouth daily.  Dispense: 30 tablet; Refill: 3

## 2015-10-27 LAB — URINE CULTURE: Colony Count: 70000

## 2015-10-28 DIAGNOSIS — I5030 Unspecified diastolic (congestive) heart failure: Secondary | ICD-10-CM | POA: Diagnosis not present

## 2015-10-28 DIAGNOSIS — G4733 Obstructive sleep apnea (adult) (pediatric): Secondary | ICD-10-CM | POA: Diagnosis not present

## 2015-10-28 DIAGNOSIS — E1122 Type 2 diabetes mellitus with diabetic chronic kidney disease: Secondary | ICD-10-CM | POA: Diagnosis not present

## 2015-10-28 DIAGNOSIS — N183 Chronic kidney disease, stage 3 (moderate): Secondary | ICD-10-CM | POA: Diagnosis not present

## 2015-10-28 DIAGNOSIS — I130 Hypertensive heart and chronic kidney disease with heart failure and stage 1 through stage 4 chronic kidney disease, or unspecified chronic kidney disease: Secondary | ICD-10-CM | POA: Diagnosis not present

## 2015-10-28 DIAGNOSIS — J45909 Unspecified asthma, uncomplicated: Secondary | ICD-10-CM | POA: Diagnosis not present

## 2015-10-30 DIAGNOSIS — I130 Hypertensive heart and chronic kidney disease with heart failure and stage 1 through stage 4 chronic kidney disease, or unspecified chronic kidney disease: Secondary | ICD-10-CM | POA: Diagnosis not present

## 2015-10-30 DIAGNOSIS — N183 Chronic kidney disease, stage 3 (moderate): Secondary | ICD-10-CM | POA: Diagnosis not present

## 2015-10-30 DIAGNOSIS — I5030 Unspecified diastolic (congestive) heart failure: Secondary | ICD-10-CM | POA: Diagnosis not present

## 2015-10-30 DIAGNOSIS — E1122 Type 2 diabetes mellitus with diabetic chronic kidney disease: Secondary | ICD-10-CM | POA: Diagnosis not present

## 2015-10-30 DIAGNOSIS — J45909 Unspecified asthma, uncomplicated: Secondary | ICD-10-CM | POA: Diagnosis not present

## 2015-10-30 DIAGNOSIS — G4733 Obstructive sleep apnea (adult) (pediatric): Secondary | ICD-10-CM | POA: Diagnosis not present

## 2015-11-02 DIAGNOSIS — N183 Chronic kidney disease, stage 3 (moderate): Secondary | ICD-10-CM | POA: Diagnosis not present

## 2015-11-02 DIAGNOSIS — Z86711 Personal history of pulmonary embolism: Secondary | ICD-10-CM | POA: Diagnosis not present

## 2015-11-02 DIAGNOSIS — E7800 Pure hypercholesterolemia, unspecified: Secondary | ICD-10-CM | POA: Diagnosis not present

## 2015-11-02 DIAGNOSIS — Z9981 Dependence on supplemental oxygen: Secondary | ICD-10-CM | POA: Diagnosis not present

## 2015-11-02 DIAGNOSIS — E1122 Type 2 diabetes mellitus with diabetic chronic kidney disease: Secondary | ICD-10-CM | POA: Diagnosis not present

## 2015-11-02 DIAGNOSIS — J45909 Unspecified asthma, uncomplicated: Secondary | ICD-10-CM | POA: Diagnosis not present

## 2015-11-02 DIAGNOSIS — F411 Generalized anxiety disorder: Secondary | ICD-10-CM | POA: Diagnosis not present

## 2015-11-02 DIAGNOSIS — M419 Scoliosis, unspecified: Secondary | ICD-10-CM | POA: Diagnosis not present

## 2015-11-02 DIAGNOSIS — E039 Hypothyroidism, unspecified: Secondary | ICD-10-CM | POA: Diagnosis not present

## 2015-11-02 DIAGNOSIS — I130 Hypertensive heart and chronic kidney disease with heart failure and stage 1 through stage 4 chronic kidney disease, or unspecified chronic kidney disease: Secondary | ICD-10-CM | POA: Diagnosis not present

## 2015-11-02 DIAGNOSIS — N390 Urinary tract infection, site not specified: Secondary | ICD-10-CM | POA: Diagnosis not present

## 2015-11-02 DIAGNOSIS — G4733 Obstructive sleep apnea (adult) (pediatric): Secondary | ICD-10-CM | POA: Diagnosis not present

## 2015-11-02 DIAGNOSIS — I5030 Unspecified diastolic (congestive) heart failure: Secondary | ICD-10-CM | POA: Diagnosis not present

## 2015-11-02 DIAGNOSIS — F329 Major depressive disorder, single episode, unspecified: Secondary | ICD-10-CM | POA: Diagnosis not present

## 2015-11-04 DIAGNOSIS — N183 Chronic kidney disease, stage 3 (moderate): Secondary | ICD-10-CM | POA: Diagnosis not present

## 2015-11-04 DIAGNOSIS — I5030 Unspecified diastolic (congestive) heart failure: Secondary | ICD-10-CM | POA: Diagnosis not present

## 2015-11-04 DIAGNOSIS — J45909 Unspecified asthma, uncomplicated: Secondary | ICD-10-CM | POA: Diagnosis not present

## 2015-11-04 DIAGNOSIS — N390 Urinary tract infection, site not specified: Secondary | ICD-10-CM | POA: Diagnosis not present

## 2015-11-04 DIAGNOSIS — E1122 Type 2 diabetes mellitus with diabetic chronic kidney disease: Secondary | ICD-10-CM | POA: Diagnosis not present

## 2015-11-04 DIAGNOSIS — I130 Hypertensive heart and chronic kidney disease with heart failure and stage 1 through stage 4 chronic kidney disease, or unspecified chronic kidney disease: Secondary | ICD-10-CM | POA: Diagnosis not present

## 2015-11-05 ENCOUNTER — Other Ambulatory Visit: Payer: Self-pay

## 2015-11-05 MED ORDER — INSULIN NPH ISOPHANE & REGULAR (70-30) 100 UNIT/ML ~~LOC~~ SUSP: SUBCUTANEOUS | Status: DC

## 2015-11-06 DIAGNOSIS — B961 Klebsiella pneumoniae [K. pneumoniae] as the cause of diseases classified elsewhere: Secondary | ICD-10-CM | POA: Diagnosis not present

## 2015-11-06 DIAGNOSIS — Z0000 Encounter for general adult medical examination without abnormal findings: Secondary | ICD-10-CM | POA: Diagnosis not present

## 2015-11-06 DIAGNOSIS — J45909 Unspecified asthma, uncomplicated: Secondary | ICD-10-CM | POA: Diagnosis not present

## 2015-11-06 DIAGNOSIS — E1122 Type 2 diabetes mellitus with diabetic chronic kidney disease: Secondary | ICD-10-CM | POA: Diagnosis not present

## 2015-11-06 DIAGNOSIS — I5030 Unspecified diastolic (congestive) heart failure: Secondary | ICD-10-CM | POA: Diagnosis not present

## 2015-11-06 DIAGNOSIS — I130 Hypertensive heart and chronic kidney disease with heart failure and stage 1 through stage 4 chronic kidney disease, or unspecified chronic kidney disease: Secondary | ICD-10-CM | POA: Diagnosis not present

## 2015-11-06 DIAGNOSIS — R350 Frequency of micturition: Secondary | ICD-10-CM | POA: Diagnosis not present

## 2015-11-06 DIAGNOSIS — N183 Chronic kidney disease, stage 3 (moderate): Secondary | ICD-10-CM | POA: Diagnosis not present

## 2015-11-06 DIAGNOSIS — N209 Urinary calculus, unspecified: Secondary | ICD-10-CM | POA: Diagnosis not present

## 2015-11-06 DIAGNOSIS — R8271 Bacteriuria: Secondary | ICD-10-CM | POA: Diagnosis not present

## 2015-11-06 DIAGNOSIS — N390 Urinary tract infection, site not specified: Secondary | ICD-10-CM | POA: Diagnosis not present

## 2015-11-07 ENCOUNTER — Telehealth: Payer: Self-pay | Admitting: Adult Health

## 2015-11-07 NOTE — Telephone Encounter (Signed)
Ok for therapy

## 2015-11-07 NOTE — Telephone Encounter (Signed)
Ok for PT 

## 2015-11-07 NOTE — Telephone Encounter (Signed)
[name] needs verbal order for twice a wk for 8 wks for physical therapy for this patient

## 2015-11-07 NOTE — Telephone Encounter (Signed)
Called and left a detailed message for Frankie Lucuas on vm for PT.

## 2015-11-11 DIAGNOSIS — Z961 Presence of intraocular lens: Secondary | ICD-10-CM | POA: Diagnosis not present

## 2015-11-11 DIAGNOSIS — E119 Type 2 diabetes mellitus without complications: Secondary | ICD-10-CM | POA: Diagnosis not present

## 2015-11-11 DIAGNOSIS — N390 Urinary tract infection, site not specified: Secondary | ICD-10-CM | POA: Diagnosis not present

## 2015-11-11 DIAGNOSIS — Z0100 Encounter for examination of eyes and vision without abnormal findings: Secondary | ICD-10-CM | POA: Diagnosis not present

## 2015-11-11 DIAGNOSIS — I5030 Unspecified diastolic (congestive) heart failure: Secondary | ICD-10-CM | POA: Diagnosis not present

## 2015-11-11 DIAGNOSIS — E1122 Type 2 diabetes mellitus with diabetic chronic kidney disease: Secondary | ICD-10-CM | POA: Diagnosis not present

## 2015-11-11 DIAGNOSIS — I130 Hypertensive heart and chronic kidney disease with heart failure and stage 1 through stage 4 chronic kidney disease, or unspecified chronic kidney disease: Secondary | ICD-10-CM | POA: Diagnosis not present

## 2015-11-11 DIAGNOSIS — J45909 Unspecified asthma, uncomplicated: Secondary | ICD-10-CM | POA: Diagnosis not present

## 2015-11-11 DIAGNOSIS — H33102 Unspecified retinoschisis, left eye: Secondary | ICD-10-CM | POA: Diagnosis not present

## 2015-11-11 DIAGNOSIS — N183 Chronic kidney disease, stage 3 (moderate): Secondary | ICD-10-CM | POA: Diagnosis not present

## 2015-11-11 LAB — HM DIABETES EYE EXAM

## 2015-11-13 DIAGNOSIS — E1122 Type 2 diabetes mellitus with diabetic chronic kidney disease: Secondary | ICD-10-CM | POA: Diagnosis not present

## 2015-11-13 DIAGNOSIS — I130 Hypertensive heart and chronic kidney disease with heart failure and stage 1 through stage 4 chronic kidney disease, or unspecified chronic kidney disease: Secondary | ICD-10-CM | POA: Diagnosis not present

## 2015-11-13 DIAGNOSIS — N390 Urinary tract infection, site not specified: Secondary | ICD-10-CM | POA: Diagnosis not present

## 2015-11-13 DIAGNOSIS — N183 Chronic kidney disease, stage 3 (moderate): Secondary | ICD-10-CM | POA: Diagnosis not present

## 2015-11-13 DIAGNOSIS — J45909 Unspecified asthma, uncomplicated: Secondary | ICD-10-CM | POA: Diagnosis not present

## 2015-11-13 DIAGNOSIS — I5030 Unspecified diastolic (congestive) heart failure: Secondary | ICD-10-CM | POA: Diagnosis not present

## 2015-11-18 ENCOUNTER — Other Ambulatory Visit: Payer: Self-pay | Admitting: Adult Health

## 2015-11-19 DIAGNOSIS — N200 Calculus of kidney: Secondary | ICD-10-CM | POA: Diagnosis not present

## 2015-11-22 DIAGNOSIS — I5030 Unspecified diastolic (congestive) heart failure: Secondary | ICD-10-CM | POA: Diagnosis not present

## 2015-11-22 DIAGNOSIS — E1122 Type 2 diabetes mellitus with diabetic chronic kidney disease: Secondary | ICD-10-CM | POA: Diagnosis not present

## 2015-11-22 DIAGNOSIS — N390 Urinary tract infection, site not specified: Secondary | ICD-10-CM | POA: Diagnosis not present

## 2015-11-22 DIAGNOSIS — N183 Chronic kidney disease, stage 3 (moderate): Secondary | ICD-10-CM | POA: Diagnosis not present

## 2015-11-22 DIAGNOSIS — J45909 Unspecified asthma, uncomplicated: Secondary | ICD-10-CM | POA: Diagnosis not present

## 2015-11-22 DIAGNOSIS — I130 Hypertensive heart and chronic kidney disease with heart failure and stage 1 through stage 4 chronic kidney disease, or unspecified chronic kidney disease: Secondary | ICD-10-CM | POA: Diagnosis not present

## 2015-11-25 DIAGNOSIS — I5030 Unspecified diastolic (congestive) heart failure: Secondary | ICD-10-CM | POA: Diagnosis not present

## 2015-11-25 DIAGNOSIS — J45909 Unspecified asthma, uncomplicated: Secondary | ICD-10-CM | POA: Diagnosis not present

## 2015-11-25 DIAGNOSIS — N183 Chronic kidney disease, stage 3 (moderate): Secondary | ICD-10-CM | POA: Diagnosis not present

## 2015-11-25 DIAGNOSIS — N390 Urinary tract infection, site not specified: Secondary | ICD-10-CM | POA: Diagnosis not present

## 2015-11-25 DIAGNOSIS — I130 Hypertensive heart and chronic kidney disease with heart failure and stage 1 through stage 4 chronic kidney disease, or unspecified chronic kidney disease: Secondary | ICD-10-CM | POA: Diagnosis not present

## 2015-11-25 DIAGNOSIS — E1122 Type 2 diabetes mellitus with diabetic chronic kidney disease: Secondary | ICD-10-CM | POA: Diagnosis not present

## 2015-11-27 DIAGNOSIS — N183 Chronic kidney disease, stage 3 (moderate): Secondary | ICD-10-CM | POA: Diagnosis not present

## 2015-11-27 DIAGNOSIS — E039 Hypothyroidism, unspecified: Secondary | ICD-10-CM | POA: Diagnosis not present

## 2015-11-27 DIAGNOSIS — I10 Essential (primary) hypertension: Secondary | ICD-10-CM | POA: Diagnosis not present

## 2015-11-27 DIAGNOSIS — E1122 Type 2 diabetes mellitus with diabetic chronic kidney disease: Secondary | ICD-10-CM | POA: Diagnosis not present

## 2015-11-27 DIAGNOSIS — I130 Hypertensive heart and chronic kidney disease with heart failure and stage 1 through stage 4 chronic kidney disease, or unspecified chronic kidney disease: Secondary | ICD-10-CM | POA: Diagnosis not present

## 2015-11-27 DIAGNOSIS — J45909 Unspecified asthma, uncomplicated: Secondary | ICD-10-CM | POA: Diagnosis not present

## 2015-11-27 DIAGNOSIS — I5030 Unspecified diastolic (congestive) heart failure: Secondary | ICD-10-CM | POA: Diagnosis not present

## 2015-11-27 DIAGNOSIS — N390 Urinary tract infection, site not specified: Secondary | ICD-10-CM | POA: Diagnosis not present

## 2015-11-27 DIAGNOSIS — E1165 Type 2 diabetes mellitus with hyperglycemia: Secondary | ICD-10-CM | POA: Diagnosis not present

## 2015-11-27 DIAGNOSIS — E7800 Pure hypercholesterolemia, unspecified: Secondary | ICD-10-CM | POA: Diagnosis not present

## 2015-11-29 DIAGNOSIS — E1122 Type 2 diabetes mellitus with diabetic chronic kidney disease: Secondary | ICD-10-CM | POA: Diagnosis not present

## 2015-11-29 DIAGNOSIS — J45909 Unspecified asthma, uncomplicated: Secondary | ICD-10-CM | POA: Diagnosis not present

## 2015-11-29 DIAGNOSIS — I130 Hypertensive heart and chronic kidney disease with heart failure and stage 1 through stage 4 chronic kidney disease, or unspecified chronic kidney disease: Secondary | ICD-10-CM | POA: Diagnosis not present

## 2015-11-29 DIAGNOSIS — I5030 Unspecified diastolic (congestive) heart failure: Secondary | ICD-10-CM | POA: Diagnosis not present

## 2015-11-29 DIAGNOSIS — N390 Urinary tract infection, site not specified: Secondary | ICD-10-CM | POA: Diagnosis not present

## 2015-11-29 DIAGNOSIS — N183 Chronic kidney disease, stage 3 (moderate): Secondary | ICD-10-CM | POA: Diagnosis not present

## 2015-12-02 DIAGNOSIS — I5030 Unspecified diastolic (congestive) heart failure: Secondary | ICD-10-CM | POA: Diagnosis not present

## 2015-12-02 DIAGNOSIS — J45909 Unspecified asthma, uncomplicated: Secondary | ICD-10-CM | POA: Diagnosis not present

## 2015-12-02 DIAGNOSIS — N183 Chronic kidney disease, stage 3 (moderate): Secondary | ICD-10-CM | POA: Diagnosis not present

## 2015-12-02 DIAGNOSIS — I130 Hypertensive heart and chronic kidney disease with heart failure and stage 1 through stage 4 chronic kidney disease, or unspecified chronic kidney disease: Secondary | ICD-10-CM | POA: Diagnosis not present

## 2015-12-02 DIAGNOSIS — E1122 Type 2 diabetes mellitus with diabetic chronic kidney disease: Secondary | ICD-10-CM | POA: Diagnosis not present

## 2015-12-02 DIAGNOSIS — N390 Urinary tract infection, site not specified: Secondary | ICD-10-CM | POA: Diagnosis not present

## 2015-12-06 DIAGNOSIS — J45909 Unspecified asthma, uncomplicated: Secondary | ICD-10-CM | POA: Diagnosis not present

## 2015-12-06 DIAGNOSIS — N390 Urinary tract infection, site not specified: Secondary | ICD-10-CM | POA: Diagnosis not present

## 2015-12-06 DIAGNOSIS — I130 Hypertensive heart and chronic kidney disease with heart failure and stage 1 through stage 4 chronic kidney disease, or unspecified chronic kidney disease: Secondary | ICD-10-CM | POA: Diagnosis not present

## 2015-12-06 DIAGNOSIS — I5030 Unspecified diastolic (congestive) heart failure: Secondary | ICD-10-CM | POA: Diagnosis not present

## 2015-12-06 DIAGNOSIS — N183 Chronic kidney disease, stage 3 (moderate): Secondary | ICD-10-CM | POA: Diagnosis not present

## 2015-12-06 DIAGNOSIS — E1122 Type 2 diabetes mellitus with diabetic chronic kidney disease: Secondary | ICD-10-CM | POA: Diagnosis not present

## 2015-12-09 DIAGNOSIS — J45909 Unspecified asthma, uncomplicated: Secondary | ICD-10-CM | POA: Diagnosis not present

## 2015-12-09 DIAGNOSIS — I130 Hypertensive heart and chronic kidney disease with heart failure and stage 1 through stage 4 chronic kidney disease, or unspecified chronic kidney disease: Secondary | ICD-10-CM | POA: Diagnosis not present

## 2015-12-09 DIAGNOSIS — I5030 Unspecified diastolic (congestive) heart failure: Secondary | ICD-10-CM | POA: Diagnosis not present

## 2015-12-09 DIAGNOSIS — N183 Chronic kidney disease, stage 3 (moderate): Secondary | ICD-10-CM | POA: Diagnosis not present

## 2015-12-09 DIAGNOSIS — N390 Urinary tract infection, site not specified: Secondary | ICD-10-CM | POA: Diagnosis not present

## 2015-12-09 DIAGNOSIS — E1122 Type 2 diabetes mellitus with diabetic chronic kidney disease: Secondary | ICD-10-CM | POA: Diagnosis not present

## 2015-12-10 ENCOUNTER — Ambulatory Visit (INDEPENDENT_AMBULATORY_CARE_PROVIDER_SITE_OTHER): Payer: Medicare Other | Admitting: Adult Health

## 2015-12-10 ENCOUNTER — Encounter: Payer: Self-pay | Admitting: Adult Health

## 2015-12-10 VITALS — BP 136/52 | HR 58 | Temp 97.4°F | Wt 238.6 lb

## 2015-12-10 DIAGNOSIS — Z794 Long term (current) use of insulin: Secondary | ICD-10-CM

## 2015-12-10 DIAGNOSIS — E1122 Type 2 diabetes mellitus with diabetic chronic kidney disease: Secondary | ICD-10-CM | POA: Diagnosis not present

## 2015-12-10 DIAGNOSIS — N183 Chronic kidney disease, stage 3 unspecified: Secondary | ICD-10-CM

## 2015-12-10 DIAGNOSIS — I2510 Atherosclerotic heart disease of native coronary artery without angina pectoris: Secondary | ICD-10-CM

## 2015-12-10 DIAGNOSIS — IMO0002 Reserved for concepts with insufficient information to code with codable children: Secondary | ICD-10-CM

## 2015-12-10 DIAGNOSIS — I2584 Coronary atherosclerosis due to calcified coronary lesion: Secondary | ICD-10-CM | POA: Diagnosis not present

## 2015-12-10 DIAGNOSIS — E1165 Type 2 diabetes mellitus with hyperglycemia: Secondary | ICD-10-CM

## 2015-12-10 LAB — POCT GLYCOSYLATED HEMOGLOBIN (HGB A1C): Hemoglobin A1C: 7.4

## 2015-12-10 MED ORDER — GLIPIZIDE 5 MG PO TABS: 5.0000 mg | ORAL_TABLET | Freq: Two times a day (BID) | ORAL | Status: DC

## 2015-12-10 NOTE — Progress Notes (Signed)
Subjective:    Patient ID: [name], female    DOB: 11/29/1934, 81 y.o.   MRN: [id number]  HPI  81  Year old female who presents to the office today for diabetes follow up. She reports that " I am doing really well." She reports that she is using her pool and is is using a stationary bike. She is eating much healthier. Her blood sugars have come down and she is monitoring her blood sugars at home. Per her journal they are between 130-200. This is an improvement in the past. She is taking 70 units of Humilin 70/30 in the morning and 65 units at night.   She feels healthier and her attitude has improved. She states " I am enjoying my life again"   Review of Systems  Constitutional: Negative.   HENT: Negative.   Eyes: Negative.   Respiratory: Negative.   Cardiovascular: Negative.   Gastrointestinal: Negative.   Endocrine: Negative.   Genitourinary: Negative.   Musculoskeletal: Negative.   Allergic/Immunologic: Negative.   Neurological: Negative.   Hematological: Negative.   Psychiatric/Behavioral: Negative.    Past Medical History  Diagnosis Date  . Pure hypercholesterolemia   . Unspecified essential hypertension   . Syncope and collapse   . Type II or unspecified type diabetes mellitus without mention of complication, not stated as uncontrolled   . Depressive disorder, not elsewhere classified   . Extrinsic asthma, unspecified     no problem since adulthood  . Obesity   . Anxiety state, unspecified     panic attacks  . OSA on CPAP     severe  . Allergic rhinitis   . Respiratory failure with hypoxia ([place]) 09/2008    acute, secondary to multiple bilateral pulmonary embolism , negative hypercoagulable workup 09/2008 hospital stay  . Scoliosis   . CHF (congestive heart failure) ([place])   . Unspecified hypothyroidism     hypo    Social History   Social History  . Marital Status: Widowed    Spouse Name: N/A  . Number of Children: 2  . Years of Education: N/A    Occupational History  . 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 Smoking status: Former Smoker -- 20 years    Quit date: 09/07/1989  . Smokeless tobacco: Not on file  . Alcohol Use: No  . Drug Use: No  . Sexual Activity: Not on file   Other Topics Concern  . Not on file   Social History Narrative    Past Surgical History  Procedure Laterality Date  . Thyroid surgery    . Knee replaced    . Eye surgery    . Joint replacement      Family History  Problem Relation Age of Onset  . Arthritis Other   . Diabetes Other   . Hyperlipidemia Other   . Hypertension Other   . Coronary artery disease Other   . Stroke Other   . Allergies Mother   . Clotting disorder Mother   . Osteoarthritis Mother   . Asthma Mother   . Osteoarthritis Daughter   . Rheum arthritis Maternal Grandmother   . Clotting disorder Maternal Grandmother   . Clotting disorder Maternal Uncle   . Clotting disorder Daughter   . Allergies Daughter     Allergies  Allergen Reactions  . Metformin Diarrhea  . Pineapple Swelling    Throat swells and blisters on tongue and  roof of mouth per patient  . Tetracycline Hives  . Aspirin Itching and Rash  . Contrast Media [Iodinated Diagnostic Agents] Itching       . Flagyl [Metronidazole] Other (See Comments)    Unknown   . Fluticasone-Salmeterol Itching and Rash  . Lactose Intolerance (Gi) Other (See Comments)    gas  . Latex Itching and Rash  . Lisinopril Cough  . Sulfonamide Derivatives Itching and Rash    Current Outpatient Prescriptions on File Prior to Visit  Medication Sig Dispense Refill  . atorvastatin (LIPITOR) 40 MG tablet Take 1 tablet (40 mg total) by mouth daily. 90 tablet 3  . Coenzyme Q10 200 MG capsule Take 200 mg by mouth daily.      . fexofenadine (ALLEGRA) 180 MG tablet Take 180 mg by mouth daily.      . furosemide (LASIX) 20 MG tablet TAKE 2 TABLETS BY MOUTH EVERY DAY 180 tablet 1  .  insulin NPH-regular Human (HUMULIN 70/30) (70-30) 100 UNIT/ML injection Inject 70 units under the skin as directed every morning, and 65 units every evening 30 mL 3  . Insulin Pen Needle (B-D UF III MINI PEN NEEDLES) 31G X 5 MM MISC Use daily for insulin injection 100 each 2  . levothyroxine (SYNTHROID, LEVOTHROID) 112 MCG tablet take one tablet by mouth daily  2  . mirabegron ER (MYRBETRIQ) 50 MG TB24 tablet Take 1 tablet (50 mg total) by mouth daily. 30 tablet 3  . Multiple Vitamin (MULTIVITAMIN) tablet Take 1 tablet by mouth daily.      . potassium citrate (UROCIT-K) 10 MEQ (1080 MG) SR tablet Take 10 mEq by mouth 3 (three) times daily with meals.    . Probiotic Product (PROBIOTIC PO) Take 1 capsule by mouth daily.    . sertraline (ZOLOFT) 50 MG tablet Take 50 mg by mouth daily.    . tamsulosin (FLOMAX) 0.4 MG CAPS capsule Take 0.4 mg by mouth daily.  3  . fenofibrate 54 MG tablet Take 1 tablet (54 mg total) by mouth daily. (Patient not taking: Reported on 12/10/2015) 30 tablet 6   No current facility-administered medications on file prior to visit.    BP 136/52 mmHg  Pulse 58  Temp(Src) 97.4 F (36.3 C) (Oral)  Wt 238 lb 9.6 oz (108.228 kg)  SpO2 95%       Objective:   Physical Exam  Constitutional: She is oriented to person, place, and time. She appears well-developed and well-nourished. No distress.  Cardiovascular: Normal rate, regular rhythm, normal heart sounds and intact distal pulses.  Exam reveals no gallop and no friction rub.   No murmur heard. Pulmonary/Chest: Effort normal and breath sounds normal. No respiratory distress. She has no wheezes. She has no rales. She exhibits no tenderness.  Neurological: She is alert and oriented to person, place, and time.  Skin: Skin is warm and dry. No rash noted. She is not diaphoretic. No erythema. No pallor.  Psychiatric: She has a normal mood and affect. Her behavior is normal. Thought content normal.  Nursing note and vitals  reviewed.     Assessment & Plan:  1. Uncontrolled type 2 diabetes mellitus with stage 3 chronic kidney disease, with long-term current use of insulin (HCC) - Continue to monitor glucose at home. Titrate insulin as needed - glipiZIDE (GLUCOTROL) 5 MG tablet; Take 1 tablet (5 mg total) by mouth 2 (two) times daily before a meal.  Dispense: 60 tablet; Refill: 3 - A1c from 8.3 to 7.4 -  Amb Referral to Nutrition and Diabetic E - Follow up in 3 months or sooner if needed - Continue to work on diet and exercise.   Dorothyann Peng, NP

## 2015-12-10 NOTE — Progress Notes (Signed)
Pre visit review using our clinic review tool, if applicable. No additional management support is needed unless otherwise documented below in the visit note. 

## 2015-12-10 NOTE — Patient Instructions (Addendum)
It was great seeing you again!  I am so happy that your healthy is good!  Your A1c has dropped from 8.3 to 7.4  I have sent in a prescription for Glipizide 5mg , take this twice a day   Monitor your blood sugars at home and titrate insulin as needed.   Follow up with me in 3 months.

## 2015-12-11 DIAGNOSIS — N183 Chronic kidney disease, stage 3 (moderate): Secondary | ICD-10-CM | POA: Diagnosis not present

## 2015-12-12 DIAGNOSIS — N200 Calculus of kidney: Secondary | ICD-10-CM | POA: Diagnosis not present

## 2015-12-12 DIAGNOSIS — N183 Chronic kidney disease, stage 3 (moderate): Secondary | ICD-10-CM | POA: Diagnosis not present

## 2015-12-12 DIAGNOSIS — I10 Essential (primary) hypertension: Secondary | ICD-10-CM | POA: Diagnosis not present

## 2015-12-13 DIAGNOSIS — I130 Hypertensive heart and chronic kidney disease with heart failure and stage 1 through stage 4 chronic kidney disease, or unspecified chronic kidney disease: Secondary | ICD-10-CM | POA: Diagnosis not present

## 2015-12-13 DIAGNOSIS — N183 Chronic kidney disease, stage 3 (moderate): Secondary | ICD-10-CM | POA: Diagnosis not present

## 2015-12-13 DIAGNOSIS — I5030 Unspecified diastolic (congestive) heart failure: Secondary | ICD-10-CM | POA: Diagnosis not present

## 2015-12-13 DIAGNOSIS — E1122 Type 2 diabetes mellitus with diabetic chronic kidney disease: Secondary | ICD-10-CM | POA: Diagnosis not present

## 2015-12-13 DIAGNOSIS — J45909 Unspecified asthma, uncomplicated: Secondary | ICD-10-CM | POA: Diagnosis not present

## 2015-12-13 DIAGNOSIS — N390 Urinary tract infection, site not specified: Secondary | ICD-10-CM | POA: Diagnosis not present

## 2015-12-16 DIAGNOSIS — J45909 Unspecified asthma, uncomplicated: Secondary | ICD-10-CM | POA: Diagnosis not present

## 2015-12-16 DIAGNOSIS — M47814 Spondylosis without myelopathy or radiculopathy, thoracic region: Secondary | ICD-10-CM | POA: Diagnosis not present

## 2015-12-16 DIAGNOSIS — M546 Pain in thoracic spine: Secondary | ICD-10-CM | POA: Diagnosis not present

## 2015-12-16 DIAGNOSIS — I130 Hypertensive heart and chronic kidney disease with heart failure and stage 1 through stage 4 chronic kidney disease, or unspecified chronic kidney disease: Secondary | ICD-10-CM | POA: Diagnosis not present

## 2015-12-16 DIAGNOSIS — M5134 Other intervertebral disc degeneration, thoracic region: Secondary | ICD-10-CM | POA: Diagnosis not present

## 2015-12-16 DIAGNOSIS — E1122 Type 2 diabetes mellitus with diabetic chronic kidney disease: Secondary | ICD-10-CM | POA: Diagnosis not present

## 2015-12-16 DIAGNOSIS — N183 Chronic kidney disease, stage 3 (moderate): Secondary | ICD-10-CM | POA: Diagnosis not present

## 2015-12-16 DIAGNOSIS — M9902 Segmental and somatic dysfunction of thoracic region: Secondary | ICD-10-CM | POA: Diagnosis not present

## 2015-12-16 DIAGNOSIS — N390 Urinary tract infection, site not specified: Secondary | ICD-10-CM | POA: Diagnosis not present

## 2015-12-16 DIAGNOSIS — I5030 Unspecified diastolic (congestive) heart failure: Secondary | ICD-10-CM | POA: Diagnosis not present

## 2015-12-26 DIAGNOSIS — I5030 Unspecified diastolic (congestive) heart failure: Secondary | ICD-10-CM | POA: Diagnosis not present

## 2015-12-26 DIAGNOSIS — M9902 Segmental and somatic dysfunction of thoracic region: Secondary | ICD-10-CM | POA: Diagnosis not present

## 2015-12-26 DIAGNOSIS — N390 Urinary tract infection, site not specified: Secondary | ICD-10-CM | POA: Diagnosis not present

## 2015-12-26 DIAGNOSIS — N183 Chronic kidney disease, stage 3 (moderate): Secondary | ICD-10-CM | POA: Diagnosis not present

## 2015-12-26 DIAGNOSIS — I130 Hypertensive heart and chronic kidney disease with heart failure and stage 1 through stage 4 chronic kidney disease, or unspecified chronic kidney disease: Secondary | ICD-10-CM | POA: Diagnosis not present

## 2015-12-26 DIAGNOSIS — E1122 Type 2 diabetes mellitus with diabetic chronic kidney disease: Secondary | ICD-10-CM | POA: Diagnosis not present

## 2015-12-26 DIAGNOSIS — J45909 Unspecified asthma, uncomplicated: Secondary | ICD-10-CM | POA: Diagnosis not present

## 2015-12-26 DIAGNOSIS — M546 Pain in thoracic spine: Secondary | ICD-10-CM | POA: Diagnosis not present

## 2015-12-26 DIAGNOSIS — M47814 Spondylosis without myelopathy or radiculopathy, thoracic region: Secondary | ICD-10-CM | POA: Diagnosis not present

## 2015-12-26 DIAGNOSIS — M5134 Other intervertebral disc degeneration, thoracic region: Secondary | ICD-10-CM | POA: Diagnosis not present

## 2015-12-30 DIAGNOSIS — E1122 Type 2 diabetes mellitus with diabetic chronic kidney disease: Secondary | ICD-10-CM | POA: Diagnosis not present

## 2015-12-30 DIAGNOSIS — J45909 Unspecified asthma, uncomplicated: Secondary | ICD-10-CM | POA: Diagnosis not present

## 2015-12-30 DIAGNOSIS — N390 Urinary tract infection, site not specified: Secondary | ICD-10-CM | POA: Diagnosis not present

## 2015-12-30 DIAGNOSIS — I5030 Unspecified diastolic (congestive) heart failure: Secondary | ICD-10-CM | POA: Diagnosis not present

## 2015-12-30 DIAGNOSIS — I130 Hypertensive heart and chronic kidney disease with heart failure and stage 1 through stage 4 chronic kidney disease, or unspecified chronic kidney disease: Secondary | ICD-10-CM | POA: Diagnosis not present

## 2015-12-30 DIAGNOSIS — N183 Chronic kidney disease, stage 3 (moderate): Secondary | ICD-10-CM | POA: Diagnosis not present

## 2015-12-31 ENCOUNTER — Other Ambulatory Visit: Payer: Self-pay | Admitting: Adult Health

## 2015-12-31 DIAGNOSIS — M47814 Spondylosis without myelopathy or radiculopathy, thoracic region: Secondary | ICD-10-CM | POA: Diagnosis not present

## 2015-12-31 DIAGNOSIS — M9902 Segmental and somatic dysfunction of thoracic region: Secondary | ICD-10-CM | POA: Diagnosis not present

## 2015-12-31 DIAGNOSIS — M5134 Other intervertebral disc degeneration, thoracic region: Secondary | ICD-10-CM | POA: Diagnosis not present

## 2015-12-31 DIAGNOSIS — M546 Pain in thoracic spine: Secondary | ICD-10-CM | POA: Diagnosis not present

## 2016-01-02 DIAGNOSIS — M9902 Segmental and somatic dysfunction of thoracic region: Secondary | ICD-10-CM | POA: Diagnosis not present

## 2016-01-02 DIAGNOSIS — M546 Pain in thoracic spine: Secondary | ICD-10-CM | POA: Diagnosis not present

## 2016-01-02 DIAGNOSIS — M5134 Other intervertebral disc degeneration, thoracic region: Secondary | ICD-10-CM | POA: Diagnosis not present

## 2016-01-02 DIAGNOSIS — M47814 Spondylosis without myelopathy or radiculopathy, thoracic region: Secondary | ICD-10-CM | POA: Diagnosis not present

## 2016-01-06 ENCOUNTER — Other Ambulatory Visit: Payer: Self-pay | Admitting: Adult Health

## 2016-01-06 MED ORDER — FUROSEMIDE 20 MG PO TABS: 40.0000 mg | ORAL_TABLET | Freq: Every day | ORAL | Status: DC

## 2016-01-06 NOTE — Telephone Encounter (Signed)
[facility] pharmacy states they never received pt's rx furosemide (LASIX) 20 MG tablet  on 11/18/2015 and pt is down to her last tab.

## 2016-01-06 NOTE — Telephone Encounter (Signed)
Rx phoned to the pharmacy.

## 2016-01-09 DIAGNOSIS — M5134 Other intervertebral disc degeneration, thoracic region: Secondary | ICD-10-CM | POA: Diagnosis not present

## 2016-01-09 DIAGNOSIS — E1165 Type 2 diabetes mellitus with hyperglycemia: Secondary | ICD-10-CM | POA: Diagnosis not present

## 2016-01-09 DIAGNOSIS — M546 Pain in thoracic spine: Secondary | ICD-10-CM | POA: Diagnosis not present

## 2016-01-09 DIAGNOSIS — M9902 Segmental and somatic dysfunction of thoracic region: Secondary | ICD-10-CM | POA: Diagnosis not present

## 2016-01-09 DIAGNOSIS — E7800 Pure hypercholesterolemia, unspecified: Secondary | ICD-10-CM | POA: Diagnosis not present

## 2016-01-09 DIAGNOSIS — M47814 Spondylosis without myelopathy or radiculopathy, thoracic region: Secondary | ICD-10-CM | POA: Diagnosis not present

## 2016-01-09 DIAGNOSIS — E039 Hypothyroidism, unspecified: Secondary | ICD-10-CM | POA: Diagnosis not present

## 2016-01-13 DIAGNOSIS — E039 Hypothyroidism, unspecified: Secondary | ICD-10-CM | POA: Diagnosis not present

## 2016-01-13 DIAGNOSIS — E7800 Pure hypercholesterolemia, unspecified: Secondary | ICD-10-CM | POA: Diagnosis not present

## 2016-01-13 DIAGNOSIS — E1165 Type 2 diabetes mellitus with hyperglycemia: Secondary | ICD-10-CM | POA: Diagnosis not present

## 2016-01-13 DIAGNOSIS — I10 Essential (primary) hypertension: Secondary | ICD-10-CM | POA: Diagnosis not present

## 2016-01-14 DIAGNOSIS — M47814 Spondylosis without myelopathy or radiculopathy, thoracic region: Secondary | ICD-10-CM | POA: Diagnosis not present

## 2016-01-14 DIAGNOSIS — M546 Pain in thoracic spine: Secondary | ICD-10-CM | POA: Diagnosis not present

## 2016-01-14 DIAGNOSIS — M9902 Segmental and somatic dysfunction of thoracic region: Secondary | ICD-10-CM | POA: Diagnosis not present

## 2016-01-14 DIAGNOSIS — M5134 Other intervertebral disc degeneration, thoracic region: Secondary | ICD-10-CM | POA: Diagnosis not present

## 2016-01-15 DIAGNOSIS — N200 Calculus of kidney: Secondary | ICD-10-CM | POA: Diagnosis not present

## 2016-01-15 DIAGNOSIS — R82994 Hypercalciuria: Secondary | ICD-10-CM | POA: Insufficient documentation

## 2016-01-15 DIAGNOSIS — R82992 Hyperoxaluria: Secondary | ICD-10-CM | POA: Insufficient documentation

## 2016-01-16 ENCOUNTER — Ambulatory Visit: Payer: Medicare Other | Admitting: *Deleted

## 2016-01-21 DIAGNOSIS — E1165 Type 2 diabetes mellitus with hyperglycemia: Secondary | ICD-10-CM | POA: Diagnosis not present

## 2016-01-23 DIAGNOSIS — M5134 Other intervertebral disc degeneration, thoracic region: Secondary | ICD-10-CM | POA: Diagnosis not present

## 2016-01-23 DIAGNOSIS — M9902 Segmental and somatic dysfunction of thoracic region: Secondary | ICD-10-CM | POA: Diagnosis not present

## 2016-01-23 DIAGNOSIS — M546 Pain in thoracic spine: Secondary | ICD-10-CM | POA: Diagnosis not present

## 2016-01-23 DIAGNOSIS — M47814 Spondylosis without myelopathy or radiculopathy, thoracic region: Secondary | ICD-10-CM | POA: Diagnosis not present

## 2016-01-24 ENCOUNTER — Encounter: Payer: Medicare Other | Attending: Adult Health | Admitting: Dietician

## 2016-01-24 ENCOUNTER — Encounter: Payer: Self-pay | Admitting: Dietician

## 2016-01-24 VITALS — Ht 62.0 in | Wt 239.0 lb

## 2016-01-24 DIAGNOSIS — Z794 Long term (current) use of insulin: Secondary | ICD-10-CM | POA: Insufficient documentation

## 2016-01-24 DIAGNOSIS — E1122 Type 2 diabetes mellitus with diabetic chronic kidney disease: Secondary | ICD-10-CM | POA: Insufficient documentation

## 2016-01-24 DIAGNOSIS — E118 Type 2 diabetes mellitus with unspecified complications: Secondary | ICD-10-CM

## 2016-01-24 NOTE — Patient Instructions (Signed)
Continue to be active.  Aim for 30 minutes most days. Continue the low sodium diet. Continue to read the label for sodium, fat and carbohydrates. Calorie [employer] what you drink.  1 cup of the diet juice = 1 carbohydrate serving.  This adds calories as well.Plan:  Aim for 2-3 Carb Choices per meal (30-45 grams) +/- 1 either way  Aim for 0-1 Carbs per snack if hungry  Include protein in moderation with your meals and snacks Consider reading food labels for Total Carbohydrate and Fat Grams of foods Consider checking BG at alternate times per day as directed by MD  Consider taking medication as directed by MD

## 2016-01-24 NOTE — Progress Notes (Signed)
Medical Nutrition Therapy:  Appt start time: 1030 end time:  1200.   Assessment:  Primary concerns today: Patient is here today with her caregiver.  She would like to learn how to control her weight and her blood sugar.  She states that she goes between highs and lows.  She follows a strict low sodium diet (<2000 mg per day).  She drinks 3 L fluid per day. She also follows a mostly vegetarian diet for kidney stone prevention. The referral was for Type 2 diabetes with CKD stage 3 on long term insulin. She checks her blood sugar before breakfast and before dinner.  This am it was 286 and yesterday 182.  Her Hemoglobin A1C was 7.4% 12/10/15 decreased from 8.3% 09/11/15.  Last night it was 131.   She reports wearing a continuous glucose monitor and having high blood sugars of 300's each afternoon.  Insulin added mid day and the blood sugars are now in the low hundreds.  Generally any symptoms of low blood sugar are prior to dinner when dinner is delayed. Hx includes hyperlipidemia, HTN, CHF, asthma, hypothyroidism and surgery 8 times for kidney stones in 2016.  Weight hx: 132 lbs about 20 years  She stated that she started Actose and within a year had gained to about 232 lbs and has increased since. 239 lbs is today's weight  Patient lives alone and has a caregiver. 24/7.  The main caregiver here today works form [employer] and helps her with shopping, cooking, and helps her to read labels closely.  Preferred Learning Style:   No preference indicated   Learning Readiness:   Ready  Change in progress  MEDICATIONS: see list to include Humalin 70/30 (70 units with breakfast, 15 units at noon, and 75 units after dinner.)   DIETARY INTAKE:  Usual eating pattern includes 3 meals and 0-1 snacks per day.  Avoided foods include milk as she is lactose intolerant, pinapple due to allergy, dried apricots due to increase of asthma symptoms and possible allergy, added salt.    24-hr recall:  B ( AM): 3 eggs  whites, 1 slice american cheese, 2 slices Ezekiel Bread with butter, papaya or apple OR morningstar griller and 2 slices Ezekiel Bread, mayo, provolone cheese, onions and Papaya or apple Snk ( AM): none  L ( PM): apple, 2 rice cakes with sun butter, sugar free drink Snk ( PM): none D ( PM): quiche , green salad with dressing, 1/4 muffin (from [employer]) OR 1/2 ear corn on the cob, broccoli/caulifower/ carrot stir fried, Low sodium macaroni and cheese and meatless meatballs Snk ( PM): occasional rice cake with sun butter Beverages: green tea, flavored water diet cranberry juice, diet cran/pom juice, diet grape juice diet gingerale, sprite zero, water  Usual physical activity: yoga twice per week, recumbent bike 15 minutes 4 times per week, 30-45 minutes in the pool twice per week.    Estimated energy needs: 1500 calories 170 g carbohydrates 94 g protein 50 g fat  Progress Towards Goal(s):  In progress.   Nutritional Diagnosis:  NB-1.1 Food and nutrition-related knowledge deficit As related to balance of carbohydrate, protein, and fat.  As evidenced by patient report.    Intervention:  Nutrition counseling/education related to balance of carbohydrates and counting carbs by food portion.  Discussed the amount of carbs in the diet juice that she is drinking.  Discussed importance of continued exercise and a low sodium diet.  Discussed recommendations for the kidney stones based on the type  of stone.   Continue to be active.  Aim for 30 minutes most days. Continue the low sodium diet. Continue to read the label for sodium, fat and carbohydrates. Calorie [employer] what you drink.  1 cup of the diet juice = 1 carbohydrate serving.  This adds calories as well.Plan:  Aim for 2-3 Carb Choices per meal (30-45 grams) +/- 1 either way  Aim for 0-1 Carbs per snack if hungry  Include protein in moderation with your meals and snacks Consider reading food labels for Total Carbohydrate and Fat  Grams of foods Consider checking BG at alternate times per day as directed by MD  Consider taking medication as directed by MD  Teaching Method Utilized:  Visual Auditory Hands on  Handouts given during visit include:  Nutrition Therapy for Kidney Stone prevention from AND  Tips for diet for Kidney stone prevention from AND  Meal plan card  My plate  Label reading  Snack list  Barriers to learning/adherence to lifestyle change: none  Demonstrated degree of understanding via:  Teach Back   Monitoring/Evaluation:  Dietary intake, exercise, label reading, and body weight in 3 month(s).

## 2016-01-28 ENCOUNTER — Encounter: Payer: Self-pay | Admitting: Cardiology

## 2016-01-30 ENCOUNTER — Encounter: Payer: Self-pay | Admitting: Cardiology

## 2016-01-30 ENCOUNTER — Ambulatory Visit (INDEPENDENT_AMBULATORY_CARE_PROVIDER_SITE_OTHER): Payer: Medicare Other | Admitting: Cardiology

## 2016-01-30 VITALS — BP 178/72 | HR 74 | Ht 62.0 in | Wt 239.0 lb

## 2016-01-30 DIAGNOSIS — E669 Obesity, unspecified: Secondary | ICD-10-CM | POA: Diagnosis not present

## 2016-01-30 DIAGNOSIS — I5032 Chronic diastolic (congestive) heart failure: Secondary | ICD-10-CM

## 2016-01-30 DIAGNOSIS — M47814 Spondylosis without myelopathy or radiculopathy, thoracic region: Secondary | ICD-10-CM | POA: Diagnosis not present

## 2016-01-30 DIAGNOSIS — M5134 Other intervertebral disc degeneration, thoracic region: Secondary | ICD-10-CM | POA: Diagnosis not present

## 2016-01-30 DIAGNOSIS — M546 Pain in thoracic spine: Secondary | ICD-10-CM | POA: Diagnosis not present

## 2016-01-30 DIAGNOSIS — I2584 Coronary atherosclerosis due to calcified coronary lesion: Secondary | ICD-10-CM

## 2016-01-30 DIAGNOSIS — N184 Chronic kidney disease, stage 4 (severe): Secondary | ICD-10-CM | POA: Diagnosis not present

## 2016-01-30 DIAGNOSIS — I2510 Atherosclerotic heart disease of native coronary artery without angina pectoris: Secondary | ICD-10-CM

## 2016-01-30 DIAGNOSIS — I700 Atherosclerosis of aorta: Secondary | ICD-10-CM | POA: Diagnosis not present

## 2016-01-30 DIAGNOSIS — M9902 Segmental and somatic dysfunction of thoracic region: Secondary | ICD-10-CM | POA: Diagnosis not present

## 2016-01-30 NOTE — Progress Notes (Signed)
Cardiology Office Note    Date:  01/30/2016   ID:  [name] Aug 26, 1935, MRN [id number]  PCP:  Dorothyann Peng, NP  Cardiologist:   [name], MD   No chief complaint on file.   History of Present Illness:  [name] is a 81 y.o. female here for evaluation of diastolic heart failure. She was admitted with dyspnea in December 2016 and concern for PE, CT scan was negative. Creatinine increased from 1.72 2.9. Last October creatinine was 1.4. She was successfully diuresed.  Initially, her symptoms were thought to be questionable bronchitis/pneumonia with reactive airways disease and she completed a course of Levaquin antibiotics and Solu-Medrol taper. Flu was negative. Her BNP was only 151 and she was given empiric Lasix IV 60 mg a day and was -8 L by the time of discharge. She ended up being released with 40 mg of Lasix at home. Her echocardiogram was normal ejection fraction with grade 2 diastolic dysfunction.  Physical history of multiple renal calculi and stents.  Thankfully she is no longer feeling short of breath, minimal swelling bilaterally. Uses walker for ambulation. She is watching her fluids and salt intake.  She told me that in October 2015 she had a "septic heart attack ". This was likely demand ischemia.Nuclear stress test was performed at that time and was unremarkable. Sepsis was in the setting of renal stones.    Past Medical History  Diagnosis Date  . Pure hypercholesterolemia   . Unspecified essential hypertension   . Syncope and collapse   . Type II or unspecified type diabetes mellitus without mention of complication, not stated as uncontrolled   . Depressive disorder, not elsewhere classified   . Extrinsic asthma, unspecified     no problem since adulthood  . Obesity   . Anxiety state, unspecified     panic attacks  . OSA on CPAP     severe  . Allergic rhinitis   . Respiratory failure with hypoxia ([place]) 09/2008    acute, secondary to  multiple bilateral pulmonary embolism , negative hypercoagulable workup 09/2008 hospital stay  . Scoliosis   . CHF (congestive heart failure) ([place])   . Unspecified hypothyroidism     hypo    Past Surgical History  Procedure Laterality Date  . Thyroid surgery    . Knee replaced    . Eye surgery    . Joint replacement      Outpatient Prescriptions Prior to Visit  Medication Sig Dispense Refill  . atorvastatin (LIPITOR) 40 MG tablet Take 1 tablet (40 mg total) by mouth daily. 90 tablet 3  . bisacodyl (DULCOLAX) 5 MG EC tablet Take 5 mg by mouth daily as needed for moderate constipation.    . Coenzyme Q10 200 MG capsule Take 200 mg by mouth daily.      . fexofenadine (ALLEGRA) 180 MG tablet Take 180 mg by mouth daily.      . furosemide (LASIX) 20 MG tablet Take 2 tablets (40 mg total) by mouth daily. 180 tablet 1  . insulin NPH-regular Human (HUMULIN 70/30) (70-30) 100 UNIT/ML injection Inject 70 units under the skin as directed every morning, and 65 units every evening (Patient taking differently: Inject 70 units under the skin as directed every morning, and 65 units every evening, 15 units at noon.) 30 mL 3  . Insulin Pen Needle (B-D UF III MINI PEN NEEDLES) 31G X 5 MM MISC Use daily for insulin injection 100 each 2  .  levothyroxine (SYNTHROID, LEVOTHROID) 112 MCG tablet take one tablet by mouth daily  2  . Multiple Vitamin (MULTIVITAMIN) tablet Take 1 tablet by mouth daily.      . potassium citrate (UROCIT-K) 10 MEQ (1080 MG) SR tablet Take 10 mEq by mouth 3 (three) times daily with meals.    . Probiotic Product (PROBIOTIC PO) Take 1 capsule by mouth daily.    . sertraline (ZOLOFT) 50 MG tablet Take 50 mg by mouth daily.    . fenofibrate 54 MG tablet Take 1 tablet (54 mg total) by mouth daily. 30 tablet 6  . glipiZIDE (GLUCOTROL) 5 MG tablet Take 1 tablet (5 mg total) by mouth 2 (two) times daily before a meal. 60 tablet 3  . mirabegron ER (MYRBETRIQ) 50 MG TB24 tablet Take 1 tablet (50  mg total) by mouth daily. 30 tablet 3  . tamsulosin (FLOMAX) 0.4 MG CAPS capsule Take 0.4 mg by mouth daily. Reported on 01/24/2016  3   No facility-administered medications prior to visit.     Allergies:   Metformin; Pineapple; Tetracycline; Aspirin; Contrast media; Flagyl; Fluticasone-salmeterol; Lactose intolerance (gi); Latex; Lisinopril; and Sulfonamide derivatives   Social History   Social History  . Marital Status: Widowed    Spouse Name: N/A  . Number of Children: 2  . Years of Education: N/A   Occupational History  . 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 Smoking status: Former Smoker -- 20 years    Quit date: 09/07/1989  . Smokeless tobacco: None  . Alcohol Use: No  . Drug Use: No  . Sexual Activity: Not Asked   Other Topics Concern  . None   Social History Narrative     Family History:  The patient's family history includes Allergies in her daughter and mother; Arthritis in her other; Asthma in her mother; Clotting disorder in her daughter, maternal grandmother, maternal uncle, and mother; Coronary artery disease in her other; Diabetes in her other; Hyperlipidemia in her other; Hypertension in her other; Osteoarthritis in her daughter and mother; Rheum arthritis in her maternal grandmother; Stroke in her other.   ROS:   Please see the history of present illness.    ROS  No sicca be, no bleeding, no orthopnea, no PND All other systems reviewed and are negative.   PHYSICAL EXAM:   VS:  BP 178/72 mmHg  Pulse 74  Ht 5\' 2"  (1.575 m)  Wt 239 lb (108.41 kg)  BMI 43.70 kg/m2   GEN: Well nourished, well developed, in no acute distressambulatory walker HEENT: normal Neck: no JVD, carotid bruits, or masses Cardiac: RRR; no murmurs, rubs, or gallops,no edema  Respiratory:  clear to auscultation bilaterally, normal work of breathing GI: soft, nontender, nondistended, + BSObese MS: no deformity or atrophy Skin: warm and  dry, no rash Neuro:  Alert and Oriented x 3, Strength and sensation are intact Psych: euthymic mood, full affect  Wt Readings from Last 3 Encounters:  01/30/16 239 lb (108.41 kg)  01/24/16 239 lb (108.41 kg)  12/10/15 238 lb 9.6 oz (108.228 kg)      Studies/Labs Reviewed:   EKG:  08/24/15-sinus rhythm, 70, left axis deviation, poor R-wave progression personally viewed  Recent Labs: 08/25/2015: Magnesium 2.2 08/26/2015: B Natriuretic Peptide 151.4* 09/11/2015: ALT 14; TSH 0.87 10/25/2015: BUN 56*; Creatinine, Ser 2.09*; Hemoglobin 11.3*; Platelets 160.0; Potassium 4.2; Sodium 138   Lipid Panel    Component Value Date/Time  CHOL 223* 09/11/2015 1012   TRIG * 09/11/2015 1012    516.0 Triglyceride is over 400; calculations on Lipids are invalid.   HDL 33.80* 09/11/2015 1012   CHOLHDL 7 09/11/2015 1012   VLDL 118.2* 09/25/2011 1511   LDLCALC 65 08/16/2009 0902   LDLDIRECT 90.0 09/11/2015 1012    Additional studies/ records that were reviewed today include:   08/24/15-CT Angio chest PE-minimal coronary artery calcification of the ostial LAD. Aortic atherosclerosis noted.  Nuclear stress test was performed in October 2016 in [place]. Low risk    ASSESSMENT:    1. Chronic diastolic heart failure (HCC)   2. Obesity   3. Aortic atherosclerosis ([place])   4. Chronic kidney disease (CKD), stage IV (severe) (HCC)      PLAN:  In order of problems listed above:  1. Chronic diastolic heart failure-mostly related to obesity. Overall doing well. Currently well compensated. Continue with mobilization, fluid restriction Encourage 2 L, salt restriction encourage 2 g as well as antihypertensives and Lasix therapy. She weighs herself daily. She is taking yoga. 2. Morbid obesity-We discussed fluid restriction how it is important for her to maintain her current body weight and perhaps even strive for a lower body weight 3. Decreasing carbohydrates, weight loss is very important. This may  help her overall with her diabetes, conditioning, diastolic heart failure. 4. History of PE-stable, CT scan normal. 5. History of nephrolithiasis-it will be a given take between fluid restriction, Lasix etc. 6. Aortic atherosclerosis-continue with statin, secondary prevention 7. Nephrology per [facility]. 8. We will see her back on as-needed basis. Main issue is weight loss, diabetes control. No changes made in clinic.     Medication Adjustments/Labs and Tests Ordered: Current medicines are reviewed at length with the patient today.  Concerns regarding medicines are outlined above.  Medication changes, Labs and Tests ordered today are listed in the Patient Instructions below. Patient Instructions  Medication Instructions:  The current medical regimen is effective;  continue present plan and medications.  Follow-Up: Follow up as needed.  If you need a refill on your cardiac medications before your next appointment, please call your pharmacy.  Thank you for choosing [facility]!!            Signed, [name], MD  01/30/2016 3:04 PM    [facility] [place], [place], [place]  [address] Phone: [phone]; Fax: [phone]

## 2016-01-30 NOTE — Patient Instructions (Signed)
Medication Instructions:  The current medical regimen is effective;  continue present plan and medications.  Follow-Up: Follow up as needed.  If you need a refill on your cardiac medications before your next appointment, please call your pharmacy.  Thank you for choosing [facility]!!     

## 2016-02-04 DIAGNOSIS — L820 Inflamed seborrheic keratosis: Secondary | ICD-10-CM | POA: Diagnosis not present

## 2016-02-04 DIAGNOSIS — L821 Other seborrheic keratosis: Secondary | ICD-10-CM | POA: Diagnosis not present

## 2016-02-05 ENCOUNTER — Other Ambulatory Visit: Payer: Self-pay | Admitting: Adult Health

## 2016-02-07 ENCOUNTER — Other Ambulatory Visit: Payer: Self-pay | Admitting: Adult Health

## 2016-02-07 NOTE — Telephone Encounter (Signed)
Ok to refill for one year  

## 2016-02-07 NOTE — Telephone Encounter (Signed)
I do not see this medication on patient's current medication list. Ok to refill?

## 2016-02-15 ENCOUNTER — Other Ambulatory Visit: Payer: Self-pay | Admitting: Adult Health

## 2016-03-03 DIAGNOSIS — M9903 Segmental and somatic dysfunction of lumbar region: Secondary | ICD-10-CM | POA: Diagnosis not present

## 2016-03-03 DIAGNOSIS — S338XXA Sprain of other parts of lumbar spine and pelvis, initial encounter: Secondary | ICD-10-CM | POA: Diagnosis not present

## 2016-03-03 DIAGNOSIS — M5134 Other intervertebral disc degeneration, thoracic region: Secondary | ICD-10-CM | POA: Diagnosis not present

## 2016-03-03 DIAGNOSIS — M9902 Segmental and somatic dysfunction of thoracic region: Secondary | ICD-10-CM | POA: Diagnosis not present

## 2016-03-12 DIAGNOSIS — M9902 Segmental and somatic dysfunction of thoracic region: Secondary | ICD-10-CM | POA: Diagnosis not present

## 2016-03-12 DIAGNOSIS — M9903 Segmental and somatic dysfunction of lumbar region: Secondary | ICD-10-CM | POA: Diagnosis not present

## 2016-03-12 DIAGNOSIS — M5134 Other intervertebral disc degeneration, thoracic region: Secondary | ICD-10-CM | POA: Diagnosis not present

## 2016-03-12 DIAGNOSIS — S338XXA Sprain of other parts of lumbar spine and pelvis, initial encounter: Secondary | ICD-10-CM | POA: Diagnosis not present

## 2016-03-17 ENCOUNTER — Ambulatory Visit (INDEPENDENT_AMBULATORY_CARE_PROVIDER_SITE_OTHER): Payer: Medicare Other | Admitting: Adult Health

## 2016-03-17 ENCOUNTER — Encounter: Payer: Self-pay | Admitting: Adult Health

## 2016-03-17 VITALS — BP 148/76 | Temp 98.1°F | Wt 239.0 lb

## 2016-03-17 DIAGNOSIS — E1122 Type 2 diabetes mellitus with diabetic chronic kidney disease: Secondary | ICD-10-CM | POA: Diagnosis not present

## 2016-03-17 DIAGNOSIS — N183 Chronic kidney disease, stage 3 unspecified: Secondary | ICD-10-CM

## 2016-03-17 DIAGNOSIS — Z794 Long term (current) use of insulin: Secondary | ICD-10-CM | POA: Diagnosis not present

## 2016-03-17 DIAGNOSIS — I2584 Coronary atherosclerosis due to calcified coronary lesion: Secondary | ICD-10-CM | POA: Diagnosis not present

## 2016-03-17 DIAGNOSIS — I2510 Atherosclerotic heart disease of native coronary artery without angina pectoris: Secondary | ICD-10-CM

## 2016-03-17 DIAGNOSIS — E1165 Type 2 diabetes mellitus with hyperglycemia: Secondary | ICD-10-CM | POA: Diagnosis not present

## 2016-03-17 DIAGNOSIS — IMO0002 Reserved for concepts with insufficient information to code with codable children: Secondary | ICD-10-CM

## 2016-03-17 LAB — POCT GLYCOSYLATED HEMOGLOBIN (HGB A1C): Hemoglobin A1C: 7.6

## 2016-03-17 MED ORDER — FENOFIBRATE 54 MG PO TABS
ORAL_TABLET | ORAL | Status: DC
Start: 2016-03-17 — End: 2016-12-16

## 2016-03-17 MED ORDER — GLIPIZIDE 5 MG PO TABS: 5.0000 mg | ORAL_TABLET | Freq: Two times a day (BID) | ORAL | Status: DC

## 2016-03-17 NOTE — Patient Instructions (Signed)
As always, it was great seeing you!  Your A1c is up just a little bit to 7.6.   I have sent in a prescriptions for Glipizide and Fenofibrate.   Follow up with me in 6 months for your physical

## 2016-03-17 NOTE — Progress Notes (Signed)
Subjective:    Patient ID: [name], female    DOB: [birth year], 81 y.o.   MRN: [id number]  HPI  81 year old female who presents to the office today for three month follow regarding diabetes. At that time she had started to use her pool, stationary bike and was eating healthier. She was noticing that her blood sugars were trending down. Her A1c went from 8.3 to 7.4  She reports that she continues to workout in the pool and use the stationary bike. She is eating healthy and has [place] She plans to go back there for the rest of the summer and into the fall.   She reports that her blood sugars continue to be in the 130-200.   Wt Readings from Last 3 Encounters:  03/17/16 239 lb (108.41 kg)  01/30/16 239 lb (108.41 kg)  01/24/16 239 lb (108.41 kg)    Review of Systems  Constitutional: Negative.   Respiratory: Negative.   Cardiovascular: Negative.   Genitourinary: Negative.   Musculoskeletal: Negative.   Neurological: Negative.   All other systems reviewed and are negative.  Past Medical History  Diagnosis Date  . Pure hypercholesterolemia   . Unspecified essential hypertension   . Syncope and collapse   . Type II or unspecified type diabetes mellitus without mention of complication, not stated as uncontrolled   . Depressive disorder, not elsewhere classified   . Extrinsic asthma, unspecified     no problem since adulthood  . Obesity   . Anxiety state, unspecified     panic attacks  . OSA on CPAP     severe  . Allergic rhinitis   . Respiratory failure with hypoxia ([place]) 09/2008    acute, secondary to multiple bilateral pulmonary embolism , negative hypercoagulable workup 09/2008 hospital stay  . Scoliosis   . CHF (congestive heart failure) ([place])   . Unspecified hypothyroidism     hypo    Social History   Social History  . Marital Status: Widowed    Spouse Name: N/A  . Number of Children: 2  . Years of  Education: N/A   Occupational History  . 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 Smoking status: Former Smoker -- 20 years    Quit date: 09/07/1989  . Smokeless tobacco: Not on file  . Alcohol Use: No  . Drug Use: No  . Sexual Activity: Not on file   Other Topics Concern  . Not on file   Social History Narrative    Past Surgical History  Procedure Laterality Date  . Thyroid surgery    . Knee replaced    . Eye surgery    . Joint replacement      Family History  Problem Relation Age of Onset  . Arthritis Other   . Diabetes Other   . Hyperlipidemia Other   . Hypertension Other   . Coronary artery disease Other   . Stroke Other   . Allergies Mother   . Clotting disorder Mother   . Osteoarthritis Mother   . Asthma Mother   . Osteoarthritis Daughter   . Rheum arthritis Maternal Grandmother   . Clotting disorder Maternal Grandmother   . Clotting disorder Maternal Uncle   . Clotting disorder Daughter   . Allergies Daughter     Allergies  Allergen Reactions  . Metformin Diarrhea  . Pineapple  Swelling    Throat swells and blisters on tongue and roof of mouth per patient  . Tetracycline Hives  . Aspirin Itching and Rash  . Contrast Media [Iodinated Diagnostic Agents] Itching       . Flagyl [Metronidazole] Other (See Comments)    Unknown   . Fluticasone-Salmeterol Itching and Rash  . Lactose Intolerance (Gi) Other (See Comments)    gas  . Latex Itching and Rash  . Lisinopril Cough  . Sulfonamide Derivatives Itching and Rash    Current Outpatient Prescriptions on File Prior to Visit  Medication Sig Dispense Refill  . atorvastatin (LIPITOR) 40 MG tablet Take 1 tablet (40 mg total) by mouth daily. 90 tablet 3  . bisacodyl (DULCOLAX) 5 MG EC tablet Take 5 mg by mouth daily as needed for moderate constipation.    . Coenzyme Q10 200 MG capsule Take 200 mg by mouth daily.      Marland Kitchen docusate sodium (COLACE) 100 MG capsule  Take 1 capsule by mouth daily.    . fenofibrate 54 MG tablet TAKE 1 TABLET(54 MG) BY MOUTH DAILY 90 tablet 3  . fexofenadine (ALLEGRA) 180 MG tablet Take 180 mg by mouth daily.      . furosemide (LASIX) 20 MG tablet Take 2 tablets (40 mg total) by mouth daily. 180 tablet 1  . HUMULIN 70/30 (70-30) 100 UNIT/ML injection INJECT 70 UNITS UNDER THE SKIN AS DIRECTED EVERY MORNING, AND 65 UNITS EVERY EVENING 30 mL 0  . Insulin Pen Needle (B-D UF III MINI PEN NEEDLES) 31G X 5 MM MISC Use daily for insulin injection 100 each 2  . levothyroxine (SYNTHROID, LEVOTHROID) 112 MCG tablet take one tablet by mouth daily  2  . losartan (COZAAR) 25 MG tablet TAKE 1 TABLET BY MOUTH EVERY DAY 90 tablet 0  . Multiple Vitamin (MULTIVITAMIN) tablet Take 1 tablet by mouth daily.      . potassium citrate (UROCIT-K) 10 MEQ (1080 MG) SR tablet Take 10 mEq by mouth 3 (three) times daily with meals.    . Probiotic Product (PROBIOTIC PO) Take 1 capsule by mouth daily.    . sertraline (ZOLOFT) 50 MG tablet Take 50 mg by mouth daily.     No current facility-administered medications on file prior to visit.    BP 154/60 mmHg  Temp(Src) 98.1 F (36.7 C) (Oral)  Wt 239 lb (108.41 kg)       Objective:   Physical Exam  Constitutional: She is oriented to person, place, and time. She appears well-developed and well-nourished. No distress.  Cardiovascular: Normal rate, regular rhythm, normal heart sounds and intact distal pulses.  Exam reveals no gallop and no friction rub.   No murmur heard. Pulmonary/Chest: Effort normal and breath sounds normal. No respiratory distress. She has no wheezes. She has no rales. She exhibits no tenderness.  Musculoskeletal: Normal range of motion.  Neurological: She is alert and oriented to person, place, and time.  Skin: Skin is warm and dry. No rash noted. She is not diaphoretic. No erythema. No pallor.  Psychiatric: She has a normal mood and affect. Her behavior is normal. Judgment and  thought content normal.  Nursing note and vitals reviewed.      Assessment & Plan:  1. Uncontrolled type 2 diabetes mellitus with stage 3 chronic kidney disease, with long-term current use of insulin (HCC) - POC HgB A1c - 7.6.  - A1c has increased slightly.  - Will add Glipizide 5 mg BID to regimen  -  Continue with current dose of insulin.  - Follow up in 6 months or sooner if needed  Dorothyann Peng, NP

## 2016-04-24 ENCOUNTER — Ambulatory Visit: Payer: Medicare Other | Admitting: Dietician

## 2016-05-26 DIAGNOSIS — N200 Calculus of kidney: Secondary | ICD-10-CM | POA: Diagnosis not present

## 2016-05-27 DIAGNOSIS — I509 Heart failure, unspecified: Secondary | ICD-10-CM | POA: Diagnosis not present

## 2016-05-27 DIAGNOSIS — R05 Cough: Secondary | ICD-10-CM | POA: Diagnosis not present

## 2016-05-28 ENCOUNTER — Ambulatory Visit (INDEPENDENT_AMBULATORY_CARE_PROVIDER_SITE_OTHER): Payer: Medicare Other | Admitting: Adult Health

## 2016-05-28 ENCOUNTER — Encounter: Payer: Self-pay | Admitting: Adult Health

## 2016-05-28 VITALS — BP 138/68 | Temp 98.2°F | Ht 62.0 in | Wt 241.3 lb

## 2016-05-28 DIAGNOSIS — I2584 Coronary atherosclerosis due to calcified coronary lesion: Secondary | ICD-10-CM

## 2016-05-28 DIAGNOSIS — I2510 Atherosclerotic heart disease of native coronary artery without angina pectoris: Secondary | ICD-10-CM

## 2016-05-28 DIAGNOSIS — J0110 Acute frontal sinusitis, unspecified: Secondary | ICD-10-CM | POA: Diagnosis not present

## 2016-05-28 MED ORDER — AMOXICILLIN-POT CLAVULANATE 875-125 MG PO TABS: 1.0000 | ORAL_TABLET | Freq: Two times a day (BID) | ORAL | 0 refills | Status: DC

## 2016-05-28 NOTE — Patient Instructions (Signed)
It was great seeing you today!  Your exam is more consistent with sinusitis. I have sent in a prescription for Augmentin. Take as directed   General Recommendations:    Please drink plenty of fluids.  Get plenty of rest   Sleep in humidified air  Use saline nasal sprays  Netti pot   OTC Medications:  Decongestants - helps relieve congestion   Flonase (generic fluticasone) or Nasacort (generic triamcinolone) - please make sure to use the "cross-over" technique at a 45 degree angle towards the opposite eye as opposed to straight up the nasal passageway.   Sudafed (generic pseudoephedrine - Note this is the one that is available behind the pharmacy counter); Products with phenylephrine (-PE) may also be used but is often not as effective as pseudoephedrine.   If you have HIGH BLOOD PRESSURE - Coricidin HBP; AVOID any product that is -D as this contains pseudoephedrine which may increase your blood pressure.  Afrin (oxymetazoline) every 6-8 hours for up to 3 days.   Allergies - helps relieve runny nose, itchy eyes and sneezing   Claritin (generic loratidine), Allegra (fexofenidine), or Zyrtec (generic cyrterizine) for runny nose. These medications should not cause drowsiness.  Note - Benadryl (generic diphenhydramine) may be used however may cause drowsiness  Cough -   Delsym or Robitussin (generic dextromethorphan)  Expectorants - helps loosen mucus to ease removal   Mucinex (generic guaifenesin) as directed on the package.  Headaches / General Aches   Tylenol (generic acetaminophen) - DO NOT EXCEED 3 grams (3,000 mg) in a 24 hour time period  Advil/Motrin (generic ibuprofen)   Sore Throat -   Salt water gargle   Chloraseptic (generic benzocaine) spray or lozenges / Sucrets (generic dyclonine)    Sinusitis Sinusitis is redness, soreness, and inflammation of the paranasal sinuses. Paranasal sinuses are air pockets within the bones of your face (beneath the  eyes, the middle of the forehead, or above the eyes). In healthy paranasal sinuses, mucus is able to drain out, and air is able to circulate through them by way of your nose. However, when your paranasal sinuses are inflamed, mucus and air can become trapped. This can allow bacteria and other germs to grow and cause infection. Sinusitis can develop quickly and last only a short time (acute) or continue over a long period (chronic). Sinusitis that lasts for more than 12 weeks is considered chronic.  CAUSES  Causes of sinusitis include:  Allergies.  Structural abnormalities, such as displacement of the cartilage that separates your nostrils (deviated septum), which can decrease the air flow through your nose and sinuses and affect sinus drainage.  Functional abnormalities, such as when the small hairs (cilia) that line your sinuses and help remove mucus do not work properly or are not present. SIGNS AND SYMPTOMS  Symptoms of acute and chronic sinusitis are the same. The primary symptoms are pain and pressure around the affected sinuses. Other symptoms include:  Upper toothache.  Earache.  Headache.  Bad breath.  Decreased sense of smell and taste.  A cough, which worsens when you are lying flat.  Fatigue.  Fever.  Thick drainage from your nose, which often is green and may contain pus (purulent).  Swelling and warmth over the affected sinuses. DIAGNOSIS  Your health care provider will perform a physical exam. During the exam, your health care provider may:  Look in your nose for signs of abnormal growths in your nostrils (nasal polyps).  Tap over the affected sinus to check  for signs of infection.  View the inside of your sinuses (endoscopy) using an imaging device that has a light attached (endoscope). If your health care provider suspects that you have chronic sinusitis, one or more of the following tests may be recommended:  Allergy tests.  Nasal culture. A sample of  mucus is taken from your nose, sent to a lab, and screened for bacteria.  Nasal cytology. A sample of mucus is taken from your nose and examined by your health care provider to determine if your sinusitis is related to an allergy. TREATMENT  Most cases of acute sinusitis are related to a viral infection and will resolve on their own within 10 days. Sometimes medicines are prescribed to help relieve symptoms (pain medicine, decongestants, nasal steroid sprays, or saline sprays).  However, for sinusitis related to a bacterial infection, your health care provider will prescribe antibiotic medicines. These are medicines that will help kill the bacteria causing the infection.  Rarely, sinusitis is caused by a fungal infection. In theses cases, your health care provider will prescribe antifungal medicine. For some cases of chronic sinusitis, surgery is needed. Generally, these are cases in which sinusitis recurs more than 3 times per year, despite other treatments. HOME CARE INSTRUCTIONS   Drink plenty of water. Water helps thin the mucus so your sinuses can drain more easily.  Use a humidifier.  Inhale steam 3 to 4 times a day (for example, sit in the bathroom with the shower running).  Apply a warm, moist washcloth to your face 3 to 4 times a day, or as directed by your health care provider.  Use saline nasal sprays to help moisten and clean your sinuses.  Take medicines only as directed by your health care provider.  If you were prescribed either an antibiotic or antifungal medicine, finish it all even if you start to feel better. SEEK IMMEDIATE MEDICAL CARE IF:  You have increasing pain or severe headaches.  You have nausea, vomiting, or drowsiness.  You have swelling around your face.  You have vision problems.  You have a stiff neck.  You have difficulty breathing. MAKE SURE YOU:   Understand these instructions.  Will watch your condition.  Will get help right away if you  are not doing well or get worse. Document Released: 08/24/2005 Document Revised: 01/08/2014 Document Reviewed: 09/08/2011 [facility] Patient Information 2015 [place], [place]. This information is not intended to replace advice given to you by your health care provider. Make sure you discuss any questions you have with your health care provider.

## 2016-05-28 NOTE — Progress Notes (Signed)
Subjective:    Patient ID: [name], female    DOB: September 28, 1934, 81 y.o.   MRN: [id number]  HPI  81 year old female who presents to the office today after being seen at Urgent Care yesterday. She reports that 4 days before presenting to Urgent Care her symptoms started with a productive cough, sore throat, sinus pain and pressure and rhinorrhea. She continues with these symptoms today   She denies any fevers   She reports that last night was the worst that she experienced. She reports copious amounts of mucus and shortness of breath when she layed down.   At urgent care chest xray was negative for acute infective process but did show cardiomegaly with onset of mild congestive heart failure. She has been taking her Lasix but does report eating more sodium over the week. She denies any increased edema in her lower extremities.   She was asked to follow up with PCP and Cardiology. She has an appointment with Cardiology in 5 days.   Review of Systems  Constitutional: Positive for activity change and fatigue. Negative for appetite change.  HENT: Positive for congestion, ear pain, postnasal drip, rhinorrhea, sinus pressure and sore throat. Negative for tinnitus.   Eyes: Negative.   Respiratory: Positive for cough and shortness of breath. Negative for chest tightness and wheezing.   Cardiovascular: Negative.  Negative for chest pain and leg swelling.  Gastrointestinal: Negative.   Neurological: Positive for headaches.  All other systems reviewed and are negative.  Past Medical History:  Diagnosis Date  . Allergic rhinitis   . Anxiety state, unspecified    panic attacks  . CHF (congestive heart failure) ([place])   . Depressive disorder, not elsewhere classified   . Extrinsic asthma, unspecified    no problem since adulthood  . Obesity   . OSA on CPAP    severe  . Pure hypercholesterolemia   . Respiratory failure with hypoxia ([place]) 09/2008   acute, secondary to multiple bilateral  pulmonary embolism , negative hypercoagulable workup 09/2008 hospital stay  . Scoliosis   . Syncope and collapse   . Type II or unspecified type diabetes mellitus without mention of complication, not stated as uncontrolled   . Unspecified essential hypertension   . Unspecified hypothyroidism    hypo    Social History   Social History  . Marital status: Widowed    Spouse name: N/A  . Number of children: 2  . Years of education: N/A   Occupational History  . 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 Smoking status: Former Smoker    Years: 20.00    Quit date: 09/07/1989  . Smokeless tobacco: Not on file  . Alcohol use No  . Drug use: No  . Sexual activity: Not on file   Other Topics Concern  . Not on file   Social History Narrative  . No narrative on file    Past Surgical History:  Procedure Laterality Date  . EYE SURGERY    . JOINT REPLACEMENT    . knee replaced    . THYROID SURGERY      Family History  Problem Relation Age of Onset  . Arthritis Other   . Diabetes Other   . Hyperlipidemia Other   . Hypertension Other   . Coronary artery disease Other   . Stroke Other   . Allergies Mother   . Clotting disorder Mother   .  Osteoarthritis Mother   . Asthma Mother   . Osteoarthritis Daughter   . Rheum arthritis Maternal Grandmother   . Clotting disorder Maternal Grandmother   . Clotting disorder Maternal Uncle   . Clotting disorder Daughter   . Allergies Daughter     Allergies  Allergen Reactions  . Metformin Diarrhea  . Pineapple Swelling    Throat swells and blisters on tongue and roof of mouth per patient  . Tetracycline Hives  . Aspirin Itching and Rash  . Contrast Media [Iodinated Diagnostic Agents] Itching       . Flagyl [Metronidazole] Other (See Comments)    Unknown   . Fluticasone-Salmeterol Itching and Rash  . Lactose Intolerance (Gi) Other (See Comments)    gas  . Latex Itching and Rash    . Lisinopril Cough  . Sulfonamide Derivatives Itching and Rash    Current Outpatient Prescriptions on File Prior to Visit  Medication Sig Dispense Refill  . atorvastatin (LIPITOR) 40 MG tablet Take 1 tablet (40 mg total) by mouth daily. 90 tablet 3  . bisacodyl (DULCOLAX) 5 MG EC tablet Take 5 mg by mouth daily as needed for moderate constipation.    . Coenzyme Q10 200 MG capsule Take 200 mg by mouth daily.      Marland Kitchen docusate sodium (COLACE) 100 MG capsule Take 1 capsule by mouth daily.    . fenofibrate 54 MG tablet TAKE 1 TABLET(54 MG) BY MOUTH DAILY 90 tablet 3  . fexofenadine (ALLEGRA) 180 MG tablet Take 180 mg by mouth daily.      . furosemide (LASIX) 20 MG tablet Take 2 tablets (40 mg total) by mouth daily. 180 tablet 1  . glipiZIDE (GLUCOTROL) 5 MG tablet Take 1 tablet (5 mg total) by mouth 2 (two) times daily before a meal. 60 tablet 3  . HUMULIN 70/30 (70-30) 100 UNIT/ML injection INJECT 70 UNITS UNDER THE SKIN AS DIRECTED EVERY MORNING, AND 65 UNITS EVERY EVENING 30 mL 0  . Insulin Pen Needle (B-D UF III MINI PEN NEEDLES) 31G X 5 MM MISC Use daily for insulin injection 100 each 2  . levothyroxine (SYNTHROID, LEVOTHROID) 112 MCG tablet take one tablet by mouth daily  2  . losartan (COZAAR) 25 MG tablet TAKE 1 TABLET BY MOUTH EVERY DAY 90 tablet 0  . Multiple Vitamin (MULTIVITAMIN) tablet Take 1 tablet by mouth daily.      . potassium citrate (UROCIT-K) 10 MEQ (1080 MG) SR tablet Take 10 mEq by mouth 3 (three) times daily with meals.    . Probiotic Product (PROBIOTIC PO) Take 1 capsule by mouth daily.    . sertraline (ZOLOFT) 50 MG tablet Take 50 mg by mouth daily.     No current facility-administered medications on file prior to visit.     BP 138/68   Temp 98.2 F (36.8 C) (Oral)   Ht 5\' 2"  (1.575 m)   Wt 241 lb 4.8 oz (109.5 kg)   BMI 44.13 kg/m       Objective:   Physical Exam  Constitutional: She is oriented to person, place, and time. She appears well-developed and  well-nourished. No distress.  HENT:  Head: Normocephalic and atraumatic.  Right Ear: Hearing, tympanic membrane, external ear and ear canal normal.  Left Ear: Tympanic membrane and external ear normal. Decreased hearing is noted.  Nose: Mucosal edema and rhinorrhea present. Right sinus exhibits frontal sinus tenderness. Right sinus exhibits no maxillary sinus tenderness. Left sinus exhibits frontal sinus tenderness. Left sinus  exhibits no maxillary sinus tenderness.  Mouth/Throat: Uvula is midline and mucous membranes are normal. Oropharyngeal exudate present. No posterior oropharyngeal edema, posterior oropharyngeal erythema or tonsillar abscesses.  Eyes: Conjunctivae and EOM are normal. Pupils are equal, round, and reactive to light. Right eye exhibits no discharge. Left eye exhibits no discharge.  Cardiovascular: Normal rate, regular rhythm, normal heart sounds and intact distal pulses.  Exam reveals no gallop and no friction rub.   No murmur heard. Pulmonary/Chest: Effort normal and breath sounds normal. No respiratory distress. She has no wheezes. She has no rales. She exhibits no tenderness.  Musculoskeletal: Normal range of motion. She exhibits edema (trace bilateral). She exhibits no tenderness or deformity.  Neurological: She is alert and oriented to person, place, and time. She has normal reflexes. She displays normal reflexes. No cranial nerve deficit. She exhibits normal muscle tone. Coordination normal.  Skin: Skin is warm and dry. No rash noted. She is not diaphoretic. No erythema. No pallor.  Psychiatric: She has a normal mood and affect. Her behavior is normal. Judgment and thought content normal.  Nursing note and vitals reviewed.     Assessment & Plan:  1. Acute frontal sinusitis, recurrence not specified - Her exam is more consistent with sinusitis. Will do abx therapy - amoxicillin-clavulanate (AUGMENTIN) 875-125 MG tablet; Take 1 tablet by mouth 2 (two) times daily.   Dispense: 14 tablet; Refill: 0 - Advised conservative measures measures - Follow up as needed  Dorothyann Peng, NP

## 2016-06-01 DIAGNOSIS — N183 Chronic kidney disease, stage 3 (moderate): Secondary | ICD-10-CM | POA: Diagnosis not present

## 2016-06-02 ENCOUNTER — Encounter: Payer: Self-pay | Admitting: Cardiology

## 2016-06-02 NOTE — Progress Notes (Signed)
Cardiology Office Note   Date:  06/03/2016   ID:  [name], [name] 1935-01-19, MRN [id number]  PCP:  Dorothyann Peng, NP  Cardiologist:  Dr. Marlou Porch    Chief Complaint  Patient presents with  . Coronary artery calcification  . Aortic atherosclerosis ([place])  . Chronic diastolic heart failure (HCC)      History of Present Illness: [name] is a 81 y.o. female who presents for diastolic HF.  Now with SOB and cough, with CXR with cardiomegaly and CHF.   Her echocardiogram was normal ejection fraction with grade 2 diastolic dysfunction.  She told Dr. Marlou Porch that in October 2015 she had a "septic heart attack ". This was likely demand ischemia.Nuclear stress test was performed at that time and was unremarkable. Sepsis was in the setting of renal stones.  Today she presents with worry of CHF.  She has been diagnosed with sinusitis and is now on Augmentin. Sh is very tired on this medication but only has 2 more pills to take.  Her CXR was without PNA or acute HF.  She has had some SOB.  But overall she feel better.  No edema to trace only.  Since her episode of diastolic HF she watches her salt intake closely.  No chest pain. She has some nasal congestion and I suggested OTC Flonase or Nasocort.  Her sp02 at home runs 94-99% .  She is followed by Dr. Joelyn Oms with [facility].   BP was elevated today but was normal on last FP visit and at home is stable.   Past Medical History:  Diagnosis Date  . Allergic rhinitis   . Anxiety state, unspecified    panic attacks  . CHF (congestive heart failure) ([place])   . Depressive disorder, not elsewhere classified   . Extrinsic asthma, unspecified    no problem since adulthood  . Obesity   . OSA on CPAP    severe  . Pure hypercholesterolemia   . Respiratory failure with hypoxia ([place]) 09/2008   acute, secondary to multiple bilateral pulmonary embolism , negative hypercoagulable workup 09/2008 hospital stay  . Scoliosis   . Syncope and  collapse   . Type II or unspecified type diabetes mellitus without mention of complication, not stated as uncontrolled   . Unspecified essential hypertension   . Unspecified hypothyroidism    hypo    Past Surgical History:  Procedure Laterality Date  . EYE SURGERY    . JOINT REPLACEMENT    . knee replaced    . THYROID SURGERY       Current Outpatient Prescriptions  Medication Sig Dispense Refill  . amoxicillin-clavulanate (AUGMENTIN) 875-125 MG tablet Take 1 tablet by mouth 2 (two) times daily. 14 tablet 0  . atorvastatin (LIPITOR) 40 MG tablet Take 1 tablet (40 mg total) by mouth daily. 90 tablet 3  . bisacodyl (DULCOLAX) 5 MG EC tablet Take 5 mg by mouth daily as needed for moderate constipation.    . Coenzyme Q10 200 MG capsule Take 200 mg by mouth daily.      Marland Kitchen docusate sodium (COLACE) 100 MG capsule Take 1 capsule by mouth daily.    . fenofibrate 54 MG tablet TAKE 1 TABLET(54 MG) BY MOUTH DAILY 90 tablet 3  . fexofenadine (ALLEGRA) 180 MG tablet Take 180 mg by mouth daily.      . furosemide (LASIX) 20 MG tablet Take 2 tablets (40 mg total) by mouth daily. 180 tablet 1  . glipiZIDE (GLUCOTROL)  5 MG tablet Take 1 tablet (5 mg total) by mouth 2 (two) times daily before a meal. 60 tablet 3  . HUMULIN 70/30 (70-30) 100 UNIT/ML injection INJECT 70 UNITS UNDER THE SKIN AS DIRECTED EVERY MORNING, AND 65 UNITS EVERY EVENING 30 mL 0  . Insulin Pen Needle (B-D UF III MINI PEN NEEDLES) 31G X 5 MM MISC Use daily for insulin injection 100 each 2  . levothyroxine (SYNTHROID, LEVOTHROID) 112 MCG tablet take one tablet by mouth daily  2  . losartan (COZAAR) 25 MG tablet TAKE 1 TABLET BY MOUTH EVERY DAY 90 tablet 0  . Multiple Vitamin (MULTIVITAMIN) tablet Take 1 tablet by mouth daily.      . potassium citrate (UROCIT-K) 10 MEQ (1080 MG) SR tablet Take 10 mEq by mouth 3 (three) times daily with meals.    . Probiotic Product (PROBIOTIC PO) Take 1 capsule by mouth daily.    . sertraline (ZOLOFT)  50 MG tablet Take 50 mg by mouth daily.    . tamsulosin (FLOMAX) 0.4 MG CAPS capsule Take 0.4 mg by mouth daily.     No current facility-administered medications for this visit.     Allergies:   Metformin; Pineapple; Tetracycline; Aspirin; Contrast media [iodinated diagnostic agents]; Flagyl [metronidazole]; Fluticasone-salmeterol; Lactose intolerance (gi); Latex; Lisinopril; and Sulfonamide derivatives    Social History:  The patient  reports that she quit smoking about 26 years ago. She quit after 20.00 years of use. She has never used smokeless tobacco. She reports that she does not drink alcohol or use drugs.   Family History:  The patient's family history includes Allergies in her daughter and mother; Arthritis in her other; Asthma in her mother; Clotting disorder in her daughter, maternal grandmother, maternal uncle, and mother; Coronary artery disease in her other; Diabetes in her other; Hyperlipidemia in her other; Hypertension in her other; Osteoarthritis in her daughter and mother; Rheum arthritis in her maternal grandmother; Stroke in her other.    ROS:  General:+ colds or fevers, some weight loss Skin:no rashes or ulcers HEENT:no blurred vision, no congestion CV:see HPI PUL:see HPI GI:no diarrhea constipation or melena, no indigestion GU:no hematuria, no dysuria MS:no joint pain, no claudication Neuro:no syncope, no lightheadedness Endo:+ diabetes fairly well controlled, no thyroid disease  Wt Readings from Last 3 Encounters:  06/03/16 237 lb 12.8 oz (107.9 kg)  05/28/16 241 lb 4.8 oz (109.5 kg)  03/17/16 239 lb (108.4 kg)     PHYSICAL EXAM: VS:  BP (!) 144/50   Pulse 66   Ht 5\' 2"  (1.575 m)   Wt 237 lb 12.8 oz (107.9 kg)   BMI 43.49 kg/m  , BMI Body mass index is 43.49 kg/m. General:Pleasant affect, NAD Skin:Warm and dry, brisk capillary refill HEENT:normocephalic, sclera clear, mucus membranes moist Neck:supple, no JVD, no bruits  Heart:S1S2 RRR without  murmur, gallup, rub or click Lungs:clear without rales, rhonchi, or wheezes XLK:[id number], non tender, + BS, do not palpate liver spleen or masses Ext:no lower ext edema, , 2+ radial pulses Neuro:alert and oriented, MAE, follows commands, + facial symmetry    EKG:  EKG is ordered today. The ekg ordered today demonstrates SR no acute changes similar to previous and QTc is shorter than it was.   Recent Labs: 08/25/2015: Magnesium 2.2 08/26/2015: B Natriuretic Peptide 151.4 09/11/2015: ALT 14; TSH 0.87 10/25/2015: BUN 56; Creatinine, Ser 2.09; Hemoglobin 11.3; Platelets 160.0; Potassium 4.2; Sodium 138    Lipid Panel    Component Value Date/Time  CHOL 223 (H) 09/11/2015 1012   TRIG (H) 09/11/2015 1012    516.0 Triglyceride is over 400; calculations on Lipids are invalid.   HDL 33.80 (L) 09/11/2015 1012   CHOLHDL 7 09/11/2015 1012   VLDL 118.2 (H) 09/25/2011 1511   LDLCALC 65 08/16/2009 0902   LDLDIRECT 90.0 09/11/2015 1012       Other studies Reviewed: Additional studies/ records that were reviewed today include: . ECHO 08/25/15 Study Conclusions  - Left ventricle: The cavity size was normal. There was mild   concentric hypertrophy. Systolic function was normal. The   estimated ejection fraction was in the range of 60% to 65%. Wall   motion was normal; there were no regional wall motion   abnormalities. Features are consistent with a pseudonormal left   ventricular filling pattern, with concomitant abnormal relaxation   and increased filling pressure (grade 2 diastolic dysfunction).   Doppler parameters are consistent with elevated ventricular   end-diastolic filling pressure. - Aortic valve: Trileaflet; normal thickness leaflets. There was no   regurgitation. - Aortic root: The aortic root was normal in size. - Ascending aorta: The ascending aorta was normal in size. - Mitral valve: Structurally normal valve. Valve area by pressure   half-time: 1.76 cm^2. Valve area  by continuity equation (using   LVOT flow): 2.48 cm^2. - Left atrium: The atrium was moderately dilated. - Right ventricle: The cavity size was normal. Wall thickness was   normal. Systolic function was normal. - Right atrium: The atrium was normal in size. - Tricuspid valve: There was no regurgitation. - Pulmonary arteries: Systolic pressure was within the normal   range. - Inferior vena cava: The vessel was normal in size. - Pericardium, extracardiac: A mild pericardial effusion was   identified posterior to the heart. Features were not consistent   with tamponade physiology.  ASSESSMENT AND PLAN:    1. Chronic diastolic heart failure (HCC) currently stable  2. Obesity   3. Aortic atherosclerosis ([place])   4. Chronic kidney disease (CKD), stage IV (severe) (HCC)    Will not increase diuretics today.  She will follow up with Dr. Marlou Porch in 6 months or call if problems in the meantime.   Current medicines are reviewed with the patient today.  The patient Has no concerns regarding medicines.  The following changes have been made:  See above Labs/ tests ordered today include:see above  Disposition:   FU:  see above  Signed, Cecilie Kicks, NP  06/03/2016 5:33 PM    [facility] [place], [place], [address] [place], [place] Phone: [phone]; Fax: [phone]

## 2016-06-03 ENCOUNTER — Ambulatory Visit (INDEPENDENT_AMBULATORY_CARE_PROVIDER_SITE_OTHER): Payer: Medicare Other | Admitting: Cardiology

## 2016-06-03 ENCOUNTER — Encounter: Payer: Self-pay | Admitting: Cardiology

## 2016-06-03 ENCOUNTER — Encounter (INDEPENDENT_AMBULATORY_CARE_PROVIDER_SITE_OTHER): Payer: Self-pay

## 2016-06-03 VITALS — BP 144/50 | HR 66 | Ht 62.0 in | Wt 237.8 lb

## 2016-06-03 DIAGNOSIS — I5032 Chronic diastolic (congestive) heart failure: Secondary | ICD-10-CM

## 2016-06-03 DIAGNOSIS — E669 Obesity, unspecified: Secondary | ICD-10-CM | POA: Diagnosis not present

## 2016-06-03 DIAGNOSIS — I700 Atherosclerosis of aorta: Secondary | ICD-10-CM

## 2016-06-03 DIAGNOSIS — N184 Chronic kidney disease, stage 4 (severe): Secondary | ICD-10-CM

## 2016-06-03 DIAGNOSIS — I2510 Atherosclerotic heart disease of native coronary artery without angina pectoris: Secondary | ICD-10-CM | POA: Diagnosis not present

## 2016-06-03 DIAGNOSIS — I2584 Coronary atherosclerosis due to calcified coronary lesion: Secondary | ICD-10-CM

## 2016-06-03 NOTE — Patient Instructions (Addendum)
Medication Instructions:   Your physician recommends that you continue on your current medications as directed. Please refer to the Current Medication list given to you today.    If you need a refill on your cardiac medications before your next appointment, please call your pharmacy.  Labwork: NONE ORDER TODAY    Testing/Procedures: NONE ORDER TODAY    Follow-Up: Your physician wants you to follow-up in:  IN  6  MONTHS WITH DR  SKAINS You will receive a reminder letter in the mail two months in advance. If you don't receive a letter, please call our office to schedule the follow-up appointment.      Any Other Special Instructions Will Be Listed Below (If Applicable).                                                                                                                                                   

## 2016-06-15 DIAGNOSIS — N183 Chronic kidney disease, stage 3 (moderate): Secondary | ICD-10-CM | POA: Diagnosis not present

## 2016-06-15 DIAGNOSIS — I10 Essential (primary) hypertension: Secondary | ICD-10-CM | POA: Diagnosis not present

## 2016-06-15 DIAGNOSIS — Z23 Encounter for immunization: Secondary | ICD-10-CM | POA: Diagnosis not present

## 2016-06-15 DIAGNOSIS — N200 Calculus of kidney: Secondary | ICD-10-CM | POA: Diagnosis not present

## 2016-07-17 DIAGNOSIS — N209 Urinary calculus, unspecified: Secondary | ICD-10-CM | POA: Diagnosis not present

## 2016-07-17 DIAGNOSIS — R32 Unspecified urinary incontinence: Secondary | ICD-10-CM | POA: Diagnosis not present

## 2016-07-17 DIAGNOSIS — R8271 Bacteriuria: Secondary | ICD-10-CM | POA: Diagnosis not present

## 2016-08-20 ENCOUNTER — Other Ambulatory Visit: Payer: Self-pay | Admitting: Adult Health

## 2016-08-20 DIAGNOSIS — E1122 Type 2 diabetes mellitus with diabetic chronic kidney disease: Secondary | ICD-10-CM

## 2016-08-20 DIAGNOSIS — Z794 Long term (current) use of insulin: Principal | ICD-10-CM

## 2016-08-20 DIAGNOSIS — N183 Chronic kidney disease, stage 3 unspecified: Secondary | ICD-10-CM

## 2016-08-20 DIAGNOSIS — E1165 Type 2 diabetes mellitus with hyperglycemia: Principal | ICD-10-CM

## 2016-08-20 DIAGNOSIS — IMO0002 Reserved for concepts with insufficient information to code with codable children: Secondary | ICD-10-CM

## 2016-08-20 NOTE — Telephone Encounter (Signed)
Ok to refill for one year  

## 2016-08-20 NOTE — Telephone Encounter (Signed)
OK to refill

## 2016-08-31 ENCOUNTER — Other Ambulatory Visit: Payer: Self-pay | Admitting: Adult Health

## 2016-09-18 ENCOUNTER — Encounter (INDEPENDENT_AMBULATORY_CARE_PROVIDER_SITE_OTHER): Payer: Medicare Other | Admitting: Adult Health

## 2016-09-18 DIAGNOSIS — E7800 Pure hypercholesterolemia, unspecified: Secondary | ICD-10-CM | POA: Diagnosis not present

## 2016-09-18 DIAGNOSIS — E1165 Type 2 diabetes mellitus with hyperglycemia: Secondary | ICD-10-CM | POA: Diagnosis not present

## 2016-09-18 DIAGNOSIS — Z0289 Encounter for other administrative examinations: Secondary | ICD-10-CM

## 2016-09-18 DIAGNOSIS — E039 Hypothyroidism, unspecified: Secondary | ICD-10-CM | POA: Diagnosis not present

## 2016-11-09 DIAGNOSIS — N200 Calculus of kidney: Secondary | ICD-10-CM | POA: Diagnosis not present

## 2016-11-30 DIAGNOSIS — N200 Calculus of kidney: Secondary | ICD-10-CM | POA: Diagnosis not present

## 2016-12-08 ENCOUNTER — Other Ambulatory Visit: Payer: Self-pay | Admitting: Adult Health

## 2016-12-08 NOTE — Telephone Encounter (Signed)
Is this ok to refill?  

## 2016-12-08 NOTE — Telephone Encounter (Signed)
Ok to refill for 6 months 

## 2016-12-15 ENCOUNTER — Ambulatory Visit (INDEPENDENT_AMBULATORY_CARE_PROVIDER_SITE_OTHER): Payer: Medicare Other | Admitting: Adult Health

## 2016-12-15 ENCOUNTER — Encounter: Payer: Self-pay | Admitting: Adult Health

## 2016-12-15 VITALS — BP 146/62 | Temp 98.4°F | Resp 18 | Ht 61.5 in | Wt 242.0 lb

## 2016-12-15 DIAGNOSIS — I10 Essential (primary) hypertension: Secondary | ICD-10-CM

## 2016-12-15 DIAGNOSIS — E7800 Pure hypercholesterolemia, unspecified: Secondary | ICD-10-CM | POA: Diagnosis not present

## 2016-12-15 DIAGNOSIS — Z760 Encounter for issue of repeat prescription: Secondary | ICD-10-CM

## 2016-12-15 DIAGNOSIS — Z794 Long term (current) use of insulin: Secondary | ICD-10-CM | POA: Diagnosis not present

## 2016-12-15 DIAGNOSIS — N183 Chronic kidney disease, stage 3 unspecified: Secondary | ICD-10-CM

## 2016-12-15 DIAGNOSIS — E039 Hypothyroidism, unspecified: Secondary | ICD-10-CM

## 2016-12-15 DIAGNOSIS — IMO0002 Reserved for concepts with insufficient information to code with codable children: Secondary | ICD-10-CM

## 2016-12-15 DIAGNOSIS — F411 Generalized anxiety disorder: Secondary | ICD-10-CM | POA: Diagnosis not present

## 2016-12-15 DIAGNOSIS — N184 Chronic kidney disease, stage 4 (severe): Secondary | ICD-10-CM | POA: Diagnosis not present

## 2016-12-15 DIAGNOSIS — Z23 Encounter for immunization: Secondary | ICD-10-CM

## 2016-12-15 DIAGNOSIS — E1165 Type 2 diabetes mellitus with hyperglycemia: Secondary | ICD-10-CM

## 2016-12-15 DIAGNOSIS — E1122 Type 2 diabetes mellitus with diabetic chronic kidney disease: Secondary | ICD-10-CM

## 2016-12-15 LAB — HEPATIC FUNCTION PANEL
ALT: 12 U/L (ref 0–35)
AST: 14 U/L (ref 0–37)
Albumin: 4.4 g/dL (ref 3.5–5.2)
Alkaline Phosphatase: 54 U/L (ref 39–117)
Bilirubin, Direct: 0.1 mg/dL (ref 0.0–0.3)
Total Bilirubin: 0.6 mg/dL (ref 0.2–1.2)
Total Protein: 7.2 g/dL (ref 6.0–8.3)

## 2016-12-15 LAB — POC URINALSYSI DIPSTICK (AUTOMATED)
Bilirubin, UA: NEGATIVE
Blood, UA: NEGATIVE
Glucose, UA: NEGATIVE
Ketones, UA: NEGATIVE
Nitrite, UA: NEGATIVE
Spec Grav, UA: 1.03 (ref 1.030–1.035)
Urobilinogen, UA: 0.2 (ref ?–2.0)
pH, UA: 6 (ref 5.0–8.0)

## 2016-12-15 LAB — CBC WITH DIFFERENTIAL/PLATELET
Basophils Absolute: 0 10*3/uL (ref 0.0–0.1)
Basophils Relative: 0.4 % (ref 0.0–3.0)
Eosinophils Absolute: 0.2 10*3/uL (ref 0.0–0.7)
Eosinophils Relative: 3.9 % (ref 0.0–5.0)
HCT: 34 % — ABNORMAL LOW (ref 36.0–46.0)
Hemoglobin: 11.7 g/dL — ABNORMAL LOW (ref 12.0–15.0)
Lymphocytes Relative: 22.4 % (ref 12.0–46.0)
Lymphs Abs: 1.3 10*3/uL (ref 0.7–4.0)
MCHC: 34.4 g/dL (ref 30.0–36.0)
MCV: 91.5 fl (ref 78.0–100.0)
Monocytes Absolute: 0.5 10*3/uL (ref 0.1–1.0)
Monocytes Relative: 7.9 % (ref 3.0–12.0)
Neutro Abs: 3.8 10*3/uL (ref 1.4–7.7)
Neutrophils Relative %: 65.4 % (ref 43.0–77.0)
Platelets: 70 10*3/uL — ABNORMAL LOW (ref 150.0–400.0)
RBC: 3.71 Mil/uL — ABNORMAL LOW (ref 3.87–5.11)
RDW: 18.2 % — ABNORMAL HIGH (ref 11.5–15.5)
WBC: 5.8 10*3/uL (ref 4.0–10.5)

## 2016-12-15 LAB — TSH: TSH: 1.82 u[IU]/mL (ref 0.35–4.50)

## 2016-12-15 LAB — LIPID PANEL
Cholesterol: 166 mg/dL (ref 0–200)
HDL: 30.5 mg/dL — ABNORMAL LOW (ref >39.00)
Total CHOL/HDL Ratio: 5
Triglycerides: 513 mg/dL — ABNORMAL HIGH (ref 0.0–149.0)

## 2016-12-15 LAB — BASIC METABOLIC PANEL
BUN: 44 mg/dL — ABNORMAL HIGH (ref 6–23)
CO2: 28 mEq/L (ref 19–32)
Calcium: 10 mg/dL (ref 8.4–10.5)
Chloride: 101 mEq/L (ref 96–112)
Creatinine, Ser: 2.24 mg/dL — ABNORMAL HIGH (ref 0.40–1.20)
GFR: 22.39 mL/min — ABNORMAL LOW (ref >60.00)
Glucose, Bld: 221 mg/dL — ABNORMAL HIGH (ref 70–99)
Potassium: 4.1 mEq/L (ref 3.5–5.1)
Sodium: 141 mEq/L (ref 135–145)

## 2016-12-15 LAB — LDL CHOLESTEROL, DIRECT: Direct LDL: 54 mg/dL

## 2016-12-15 LAB — HEMOGLOBIN A1C: Hgb A1c MFr Bld: 7 % — ABNORMAL HIGH (ref 4.6–6.5)

## 2016-12-15 MED ORDER — LEVOTHYROXINE SODIUM 112 MCG PO TABS: 112.0000 ug | ORAL_TABLET | Freq: Every day | ORAL | 3 refills | Status: DC

## 2016-12-15 MED ORDER — SERTRALINE HCL 50 MG PO TABS: 50.0000 mg | ORAL_TABLET | Freq: Every day | ORAL | 1 refills | Status: DC

## 2016-12-15 MED ORDER — POTASSIUM CITRATE ER 10 MEQ (1080 MG) PO TBCR: 10.0000 meq | EXTENDED_RELEASE_TABLET | Freq: Three times a day (TID) | ORAL | 3 refills | Status: AC

## 2016-12-15 MED ORDER — GLIPIZIDE 5 MG PO TABS: 5.0000 mg | ORAL_TABLET | Freq: Two times a day (BID) | ORAL | 3 refills | Status: DC

## 2016-12-15 MED ORDER — INSULIN NPH ISOPHANE & REGULAR (70-30) 100 UNIT/ML ~~LOC~~ SUSP: SUBCUTANEOUS | 11 refills | Status: DC

## 2016-12-15 MED ORDER — LOSARTAN POTASSIUM 25 MG PO TABS: 25.0000 mg | ORAL_TABLET | Freq: Every day | ORAL | 3 refills | Status: DC

## 2016-12-15 MED ORDER — ATORVASTATIN CALCIUM 40 MG PO TABS: 40.0000 mg | ORAL_TABLET | Freq: Every day | ORAL | 3 refills | Status: DC

## 2016-12-15 MED ORDER — INSULIN PEN NEEDLE 31G X 5 MM MISC: 2 refills | Status: DC

## 2016-12-15 MED ORDER — FUROSEMIDE 20 MG PO TABS: 40.0000 mg | ORAL_TABLET | Freq: Every day | ORAL | 3 refills | Status: DC

## 2016-12-15 NOTE — Progress Notes (Signed)
Subjective:    Patient ID: [name], female    DOB: March 21, 1935, 82 y.o.   MRN: [id number]  HPI  Patient presents for yearly follow up exam .  She is a pleasant 82 year old female who  has a past medical history of Allergic rhinitis; Anxiety state, unspecified; CHF (congestive heart failure) ([place]); Depressive disorder, not elsewhere classified; Extrinsic asthma, unspecified; Obesity; OSA on CPAP; Pure hypercholesterolemia; Respiratory failure with hypoxia ([place]) (09/2008); Scoliosis; Syncope and collapse; Type II or unspecified type diabetes mellitus without mention of complication, not stated as uncontrolled; Unspecified essential hypertension; and Unspecified hypothyroidism.  All immunizations and health maintenance protocols were reviewed with the patient and needed orders were placed.  Appropriate screening laboratory values were ordered for the patient including screening of hyperlipidemia, renal function and hepatic function.  Medication reconciliation,  past medical history, social history, problem list and allergies were reviewed in detail with the patient  Goals were established with regard to weight loss, exercise, and  diet in compliance with medications. She has been doing Yoga. She will be back in the pool once the weather gets warmer.   End of life planning was discussed. She has an advanced directive and living will.   She is up to date on her colonoscopy. She has an upcoming appointment with her eye doctor.   She takes fenofibrate 54 mg And Lipitor 40 mg for hyperlipidemia   She also takes Humulin 7030, glipizide 5 mg for her diabetes control  Hypothyroidism is controlled with Synthroid and 112 g  - Blood pressure is controlled with Cozaar 25 mg as well as Lasix 40 mg    Review of Systems  Constitutional: Negative.   HENT: Negative.   Eyes: Negative.   Respiratory: Negative.   Cardiovascular: Positive for leg swelling.  Gastrointestinal: Negative.     Endocrine: Negative.   Genitourinary: Negative.   Musculoskeletal: Positive for arthralgias and gait problem.  Skin: Negative.   Allergic/Immunologic: Negative.   Hematological: Negative.   Psychiatric/Behavioral: The patient is nervous/anxious.   All other systems reviewed and are negative.  Past Medical History:  Diagnosis Date  . Allergic rhinitis   . Anxiety state, unspecified    panic attacks  . CHF (congestive heart failure) ([place])   . Depressive disorder, not elsewhere classified   . Extrinsic asthma, unspecified    no problem since adulthood  . Obesity   . OSA on CPAP    severe  . Pure hypercholesterolemia   . Respiratory failure with hypoxia ([place]) 09/2008   acute, secondary to multiple bilateral pulmonary embolism , negative hypercoagulable workup 09/2008 hospital stay  . Scoliosis   . Syncope and collapse   . Type II or unspecified type diabetes mellitus without mention of complication, not stated as uncontrolled   . Unspecified essential hypertension   . Unspecified hypothyroidism    hypo    Social History   Social History  . Marital status: Widowed    Spouse name: N/A  . Number of children: 2  . Years of education: N/A   Occupational History  . 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 Smoking status: Former Smoker    Years: 20.00    Quit date: 09/07/1989  . Smokeless tobacco: Never Used  . Alcohol use No  . Drug use: No  . Sexual activity: Not on file   Other Topics Concern  . Not on file  Social History Narrative  . No narrative on file    Past Surgical History:  Procedure Laterality Date  . EYE SURGERY    . JOINT REPLACEMENT    . knee replaced    . THYROID SURGERY      Family History  Problem Relation Age of Onset  . Allergies Mother   . Clotting disorder Mother   . Osteoarthritis Mother   . Asthma Mother   . Arthritis Other   . Diabetes Other   . Hyperlipidemia Other   .  Hypertension Other   . Coronary artery disease Other   . Stroke Other   . Osteoarthritis Daughter   . Rheum arthritis Maternal Grandmother   . Clotting disorder Maternal Grandmother   . Clotting disorder Maternal Uncle   . Clotting disorder Daughter   . Allergies Daughter     Allergies  Allergen Reactions  . Metformin Diarrhea  . Pineapple Swelling    Throat swells and blisters on tongue and roof of mouth per patient  . Tetracycline Hives  . Aspirin Itching and Rash  . Contrast Media [Iodinated Diagnostic Agents] Itching       . Flagyl [Metronidazole] Other (See Comments)    Unknown   . Fluticasone-Salmeterol Itching and Rash  . Lactose Intolerance ([place]) Other (See Comments)    gas  . Latex Itching and Rash  . Lisinopril Cough  . Sulfonamide Derivatives Itching and Rash    Current Outpatient Prescriptions on File Prior to Visit  Medication Sig Dispense Refill  . atorvastatin (LIPITOR) 40 MG tablet Take 1 tablet (40 mg total) by mouth daily. 90 tablet 3  . bisacodyl (DULCOLAX) 5 MG EC tablet Take 5 mg by mouth daily as needed for moderate constipation.    . Coenzyme Q10 200 MG capsule Take 200 mg by mouth daily.      Marland Kitchen docusate sodium (COLACE) 100 MG capsule Take 1 capsule by mouth daily.    . fenofibrate 54 MG tablet TAKE 1 TABLET(54 MG) BY MOUTH DAILY 90 tablet 3  . fexofenadine (ALLEGRA) 180 MG tablet Take 180 mg by mouth daily.      . furosemide (LASIX) 20 MG tablet TAKE 2 TABLETS BY MOUTH EVERY DAY 180 tablet 0  . glipiZIDE (GLUCOTROL) 5 MG tablet Take 1 tablet (5 mg total) by mouth 2 (two) times daily before a meal. 60 tablet 3  . HUMULIN 70/30 (70-30) 100 UNIT/ML injection INJECT 70 UNITS UNDER THE SKIN AS DIRECTED EVERY MORNING, AND 65 UNITS EVERY EVENING 30 mL 0  . Insulin Pen Needle (B-D UF III MINI PEN NEEDLES) 31G X 5 MM MISC Use daily for insulin injection 100 each 2  . levothyroxine (SYNTHROID, LEVOTHROID) 112 MCG tablet take one tablet by mouth daily  2  .  Multiple Vitamin (MULTIVITAMIN) tablet Take 1 tablet by mouth daily.      . potassium citrate (UROCIT-K) 10 MEQ (1080 MG) SR tablet Take 10 mEq by mouth 3 (three) times daily with meals.    . Probiotic Product (PROBIOTIC PO) Take 1 capsule by mouth daily.    . sertraline (ZOLOFT) 50 MG tablet Take 50 mg by mouth daily.    Marland Kitchen amoxicillin-clavulanate (AUGMENTIN) 875-125 MG tablet Take 1 tablet by mouth 2 (two) times daily. (Patient not taking: Reported on 12/15/2016) 14 tablet 0  . losartan (COZAAR) 25 MG tablet TAKE 1 TABLET BY MOUTH EVERY DAY (Patient not taking: Reported on 12/15/2016) 90 tablet 0   No current facility-administered medications on  file prior to visit.     BP (!) 146/62   Temp 98.4 F (36.9 C)   Resp 18   Ht 5' 1.5" (1.562 m)   Wt 242 lb (109.8 kg)   BMI 44.99 kg/m       Objective:   Physical Exam  Constitutional: She is oriented to person, place, and time. She appears well-developed and well-nourished. No distress.  HENT:  Head: Normocephalic and atraumatic.  Right Ear: External ear normal.  Left Ear: External ear normal.  Nose: Nose normal.  Mouth/Throat: Oropharynx is clear and moist. No oropharyngeal exudate.  Eyes: Conjunctivae and EOM are normal. Pupils are equal, round, and reactive to light. Right eye exhibits no discharge. Left eye exhibits no discharge. No scleral icterus.  Neck: Normal range of motion. Neck supple. No JVD present. Carotid bruit is not present. No tracheal deviation present. No thyroid mass and no thyromegaly present.  Cardiovascular: Normal rate, regular rhythm, normal heart sounds and intact distal pulses.  Exam reveals no gallop and no friction rub.   No murmur heard. Pulmonary/Chest: Effort normal and breath sounds normal. No stridor. No respiratory distress. She has no wheezes. She has no rales. She exhibits no tenderness.  Abdominal: Soft. Bowel sounds are normal. She exhibits no distension and no mass. There is no tenderness. There is  no rebound and no guarding.  Genitourinary:  Genitourinary Comments: deferred  Musculoskeletal: Normal range of motion. She exhibits edema (Nonpitting edema noted to bilateral lower extremities).  Walks with a rolling walker, she has steady gait  Lymphadenopathy:    She has no cervical adenopathy.  Neurological: She is alert and oriented to person, place, and time. She displays normal reflexes. No cranial nerve deficit. She exhibits normal muscle tone. Coordination normal.  Skin: Skin is warm and dry. No rash noted. She is not diaphoretic. No erythema. No pallor.  Psychiatric: She has a normal mood and affect. Her behavior is normal. Judgment and thought content normal.  Nursing note and vitals reviewed.     Assessment & Plan:  1. Essential hypertension - Basic metabolic panel - CBC with Differential/Platelet - Hemoglobin A1c - Hepatic function panel - Lipid panel - POCT Urinalysis Dipstick (Automated) - TSH - Consider increasing Cozaar 2. Uncontrolled type 2 diabetes mellitus with stage 3 chronic kidney disease, with long-term current use of insulin ([facility]) - Basic metabolic panel - CBC with Differential/Platelet - Hemoglobin A1c - Hepatic function panel - Lipid panel - POCT Urinalysis Dipstick (Automated) - TSH - Consider increasing glipizide or adding additional agent 3. Hypothyroidism, unspecified type  - Basic metabolic panel - CBC with Differential/Platelet - Hemoglobin A1c - Hepatic function panel - Lipid panel - POCT Urinalysis Dipstick (Automated) - TSH - Consider increasing Synthroid 4. HYPERCHOLESTEROLEMIA - Basic metabolic panel - CBC with Differential/Platelet - Hemoglobin A1c - Hepatic function panel - Lipid panel - POCT Urinalysis Dipstick (Automated) - TSH - Consider increasing fenofibrate or Lipitor 5. Anxiety state - Continue with Zoloft  6. Need for Streptococcus pneumoniae vaccination  - Pneumococcal conjugate vaccine 13-valent IM  7. Type 2  diabetes mellitus with stage 4 chronic kidney disease, with long-term current use of insulin ([place]) - Encouraged diabetic diet as well as frequent exercise - Insulin Pen Needle (B-D UF III MINI PEN NEEDLES) 31G X 5 MM MISC; Use daily for insulin injection  Dispense: 100 each; Refill: 2 - Follow-up in 3 months 8. Medication refill  - ranitidine (ZANTAC) 150 MG tablet; Take 150 mg by mouth  daily. - potassium citrate (UROCIT-K) 10 MEQ (1080 MG) SR tablet; Take 1 tablet (10 mEq total) by mouth 3 (three) times daily with meals.  Dispense: 270 tablet; Refill: 3 - losartan (COZAAR) 25 MG tablet; Take 1 tablet (25 mg total) by mouth daily.  Dispense: 90 tablet; Refill: 3 - atorvastatin (LIPITOR) 40 MG tablet; Take 1 tablet (40 mg total) by mouth daily.  Dispense: 90 tablet; Refill: 3 - insulin NPH-regular Human (HUMULIN 70/30) (70-30) 100 UNIT/ML injection; INJECT 70 UNITS UNDER THE SKIN AS DIRECTED EVERY MORNING, AND 65 UNITS EVERY EVENING  Dispense: 30 mL; Refill: 11 - furosemide (LASIX) 20 MG tablet; Take 2 tablets (40 mg total) by mouth daily.  Dispense: 180 tablet; Refill: 3 - Insulin Pen Needle (B-D UF III MINI PEN NEEDLES) 31G X 5 MM MISC; Use daily for insulin injection  Dispense: 100 each; Refill: 2 - sertraline (ZOLOFT) 50 MG tablet; Take 1 tablet (50 mg total) by mouth daily.  Dispense: 90 tablet; Refill: 1 - levothyroxine (SYNTHROID, LEVOTHROID) 112 MCG tablet; Take 1 tablet (112 mcg total) by mouth daily.  Dispense: 90 tablet; Refill: 3 - glipiZIDE (GLUCOTROL) 5 MG tablet; Take 1 tablet (5 mg total) by mouth 2 (two) times daily before a meal.  Dispense: 180 tablet; Refill: 3  Dorothyann Peng, NP

## 2016-12-15 NOTE — Progress Notes (Signed)
Pre visit review using our clinic review tool, if applicable. No additional management support is needed unless otherwise documented below in the visit note. 

## 2016-12-15 NOTE — Patient Instructions (Signed)
It was great seeing you today!  I will follow up with you regarding your labs   Please follow up with me in 6 months or sooner if needed

## 2016-12-16 ENCOUNTER — Other Ambulatory Visit: Payer: Self-pay | Admitting: Adult Health

## 2016-12-17 ENCOUNTER — Other Ambulatory Visit: Payer: Self-pay

## 2016-12-17 DIAGNOSIS — Z760 Encounter for issue of repeat prescription: Secondary | ICD-10-CM

## 2016-12-17 MED ORDER — ATORVASTATIN CALCIUM 80 MG PO TABS: 80.0000 mg | ORAL_TABLET | Freq: Every day | ORAL | 3 refills | Status: DC

## 2016-12-18 ENCOUNTER — Other Ambulatory Visit: Payer: Self-pay | Admitting: Adult Health

## 2016-12-18 DIAGNOSIS — Z760 Encounter for issue of repeat prescription: Secondary | ICD-10-CM

## 2016-12-26 ENCOUNTER — Other Ambulatory Visit: Payer: Self-pay | Admitting: Adult Health

## 2017-01-11 DIAGNOSIS — I10 Essential (primary) hypertension: Secondary | ICD-10-CM | POA: Diagnosis not present

## 2017-01-11 DIAGNOSIS — N183 Chronic kidney disease, stage 3 (moderate): Secondary | ICD-10-CM | POA: Diagnosis not present

## 2017-01-11 DIAGNOSIS — N200 Calculus of kidney: Secondary | ICD-10-CM | POA: Diagnosis not present

## 2017-01-11 DIAGNOSIS — I499 Cardiac arrhythmia, unspecified: Secondary | ICD-10-CM | POA: Diagnosis not present

## 2017-01-12 ENCOUNTER — Ambulatory Visit (INDEPENDENT_AMBULATORY_CARE_PROVIDER_SITE_OTHER): Payer: Medicare Other | Admitting: Cardiology

## 2017-01-12 ENCOUNTER — Encounter: Payer: Self-pay | Admitting: Cardiology

## 2017-01-12 VITALS — BP 144/60 | HR 84 | Ht 61.5 in | Wt 246.0 lb

## 2017-01-12 DIAGNOSIS — Z79899 Other long term (current) drug therapy: Secondary | ICD-10-CM

## 2017-01-12 DIAGNOSIS — I10 Essential (primary) hypertension: Secondary | ICD-10-CM | POA: Diagnosis not present

## 2017-01-12 DIAGNOSIS — I480 Paroxysmal atrial fibrillation: Secondary | ICD-10-CM

## 2017-01-12 NOTE — Patient Instructions (Signed)
Medication Instructions:  The current medical regimen is effective;  continue present plan and medications.  Labwork: Please have blood work today (CBC)  Testing/Procedures: Your physician has requested that you have an echocardiogram. Echocardiography is a painless test that uses sound waves to create images of your heart. It provides your doctor with information about the size and shape of your heart and how well your heart's chambers and valves are working. This procedure takes approximately one hour. There are no restrictions for this procedure.  Follow-Up: Further follow up will be scheduled at a later date based on the results of the above testing.  If you need a refill on your cardiac medications before your next appointment, please call your pharmacy.  Thank you for choosing [facility]!!

## 2017-01-12 NOTE — Progress Notes (Addendum)
Cardiology Office Note    Date:  01/25/2017   ID:  [name] [birth year], MRN [id number]  PCP:  Dorothyann Peng, NP  Cardiologist:   [name], MD   Chief Complaint  Patient presents with  . irregular heartbeat    History of Present Illness:  [name] is a 82 y.o. female here for evaluation of diastolic heart failure. She was admitted with dyspnea in December 2016 and concern for PE, CT scan was negative. Creatinine increased from 1.72 2.9. Last October creatinine was 1.4. She was successfully diuresed.  Initially, her symptoms were thought to be questionable bronchitis/pneumonia with reactive airways disease and she completed a course of Levaquin antibiotics and Solu-Medrol taper. Flu was negative. Her BNP was only 151 and she was given empiric Lasix IV 60 mg a day and was -8 L by the time of discharge. She ended up being released with 40 mg of Lasix at home. Her echocardiogram was normal ejection fraction with grade 2 diastolic dysfunction.  Physical history of multiple renal calculi and stents.  Thankfully she is no longer feeling short of breath, minimal swelling bilaterally. Uses walker for ambulation. She is watching her fluids and salt intake.  She told me that in October 2015 she had a "septic heart attack ". This was likely demand ischemia.Nuclear stress test was performed at that time and was unremarkable. Sepsis was in the setting of renal stones.  01/12/17  [name] with nephro saw her and noted good renal function. Noted racing and irreg heart at urology 12/08/16. Thinks it started with Mybetric. She thinks her thyroid may be acting up. TSH was lower. T3 problem. New onset AFIB. She denies any shortness of breath, chest pain, syncope, orthopnea. She states that every 12 years her thyroid acts up.   Past Medical History:  Diagnosis Date  . Allergic rhinitis   . Anxiety state, unspecified    panic attacks  . CHF (congestive heart failure)  ([place])   . Depressive disorder, not elsewhere classified   . Extrinsic asthma, unspecified    no problem since adulthood  . Obesity   . OSA on CPAP    severe  . Pure hypercholesterolemia   . Respiratory failure with hypoxia ([place]) 09/2008   acute, secondary to multiple bilateral pulmonary embolism , negative hypercoagulable workup 09/2008 hospital stay  . Scoliosis   . Syncope and collapse   . Type II or unspecified type diabetes mellitus without mention of complication, not stated as uncontrolled   . Unspecified essential hypertension   . Unspecified hypothyroidism    hypo    Past Surgical History:  Procedure Laterality Date  . EYE SURGERY    . JOINT REPLACEMENT    . knee replaced    . THYROID SURGERY      Outpatient Medications Prior to Visit  Medication Sig Dispense Refill  . atorvastatin (LIPITOR) 80 MG tablet Take 1 tablet (80 mg total) by mouth daily. 90 tablet 3  . Coenzyme Q10 200 MG capsule Take 200 mg by mouth daily.      Marland Kitchen docusate sodium (COLACE) 100 MG capsule Take 1 capsule by mouth daily.    . fexofenadine (ALLEGRA) 180 MG tablet Take 180 mg by mouth daily.      . furosemide (LASIX) 20 MG tablet Take 2 tablets (40 mg total) by mouth daily. 180 tablet 3  . glipiZIDE (GLUCOTROL) 5 MG tablet Take 1 tablet (5 mg total) by mouth 2 (two)  times daily before a meal. 180 tablet 3  . insulin NPH-regular Human (HUMULIN 70/30) (70-30) 100 UNIT/ML injection INJECT 70 UNITS UNDER THE SKIN AS DIRECTED EVERY MORNING, AND 65 UNITS EVERY EVENING 30 mL 11  . Insulin Pen Needle (B-D UF III MINI PEN NEEDLES) 31G X 5 MM MISC Use daily for insulin injection 100 each 2  . levothyroxine (SYNTHROID, LEVOTHROID) 112 MCG tablet Take 1 tablet (112 mcg total) by mouth daily. 90 tablet 3  . mirabegron ER (MYRBETRIQ) 50 MG TB24 tablet Take 50 mg by mouth daily.    . Multiple Vitamin (MULTIVITAMIN) tablet Take 1 tablet by mouth daily.      . potassium citrate (UROCIT-K) 10 MEQ (1080 MG) SR tablet  Take 1 tablet (10 mEq total) by mouth 3 (three) times daily with meals. 270 tablet 3  . Probiotic Product (PROBIOTIC PO) Take 1 capsule by mouth daily.    . sertraline (ZOLOFT) 50 MG tablet Take 1 tablet (50 mg total) by mouth daily. 90 tablet 1  . amoxicillin-clavulanate (AUGMENTIN) 875-125 MG tablet Take 1 tablet by mouth 2 (two) times daily. (Patient not taking: Reported on 12/15/2016) 14 tablet 0  . atorvastatin (LIPITOR) 40 MG tablet TAKE 1 TABLET BY MOUTH DAILY 30 tablet 2  . bisacodyl (DULCOLAX) 5 MG EC tablet Take 5 mg by mouth daily as needed for moderate constipation.    Marland Kitchen losartan (COZAAR) 25 MG tablet Take 1 tablet (25 mg total) by mouth daily. 90 tablet 3  . ranitidine (ZANTAC) 150 MG tablet Take 150 mg by mouth daily.     No facility-administered medications prior to visit.      Allergies:   Metformin; Pineapple; Tetracycline; Aspirin; Contrast media [iodinated diagnostic agents]; Flagyl [metronidazole]; Fluticasone-salmeterol; Lactose intolerance (gi); Latex; Lisinopril; and Sulfonamide derivatives   Social History   Social History  . Marital status: Widowed    Spouse name: N/A  . Number of children: 2  . Years of education: N/A   Occupational History  . 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 Smoking status: Former Smoker    Years: 20.00    Quit date: 09/07/1989  . Smokeless tobacco: Never Used  . Alcohol use No  . Drug use: No  . Sexual activity: Not Asked   Other Topics Concern  . None   Social History Narrative  . None     Family History:  The patient's family history includes Allergies in her daughter and mother; Arthritis in her other; Asthma in her mother; Clotting disorder in her daughter, maternal grandmother, maternal uncle, and mother; Coronary artery disease in her other; Diabetes in her other; Hyperlipidemia in her other; Hypertension in her other; Osteoarthritis in her daughter and mother; Rheum  arthritis in her maternal grandmother; Stroke in her other.   ROS:   Please see the history of present illness.    ROS  No syncope no bleeding, no orthopnea, no PND Unless above all other review of systems negative   PHYSICAL EXAM:   VS:  BP (!) 144/60   Pulse 84   Ht 5' 1.5" (1.562 m)   Wt 246 lb (111.6 kg)   BMI 45.73 kg/m    GEN: Well nourished, well developed, in no acute distress ambulatory walker HEENT: normal  Neck: no JVD, carotid bruits, or masses Cardiac: RRR; no murmurs, rubs, or gallops,no edema  Respiratory:  clear to auscultation bilaterally, normal work of breathing GI:  soft, nontender, nondistended, + BSObese MS: no deformity or atrophy  Skin: warm and dry, no rash Neuro:  Alert and Oriented x 3, Strength and sensation are intact Psych: euthymic mood, full affect  Wt Readings from Last 3 Encounters:  01/18/17 248 lb 6.4 oz (112.7 kg)  01/12/17 246 lb (111.6 kg)  12/15/16 242 lb (109.8 kg)      Studies/Labs Reviewed:   EKG:  01/12/17-atrial flutter/fibrillation with variable conduction heart rate 84 bpm, left axis deviation, baseline artifact. 08/24/15-sinus rhythm, 70, left axis deviation, poor R-wave progression personally viewed  Recent Labs: 12/15/2016: TSH 1.82 01/18/2017: ALT 18; BUN 29.2; Creatinine 1.7; HGB 11.2; Platelets 63 Platelet count consistent in citrate; Potassium 4.6; Sodium 140   Lipid Panel    Component Value Date/Time   CHOL 166 12/15/2016 0853   TRIG (H) 12/15/2016 0853    513.0 Triglyceride is over 400; calculations on Lipids are invalid.   HDL 30.50 (L) 12/15/2016 0853   CHOLHDL 5 12/15/2016 0853   VLDL 118.2 (H) 09/25/2011 1511   LDLCALC 65 08/16/2009 0902   LDLDIRECT 54.0 12/15/2016 0853    Additional studies/ records that were reviewed today include:   08/24/15-CT Angio chest PE-minimal coronary artery calcification of the ostial LAD. Aortic atherosclerosis noted.  Nuclear stress test was performed in October 2016 in  [place]. Low risk  Echocardiogram 08/25/15:  - Left ventricle: The cavity size was normal. There was mild   concentric hypertrophy. Systolic function was normal. The   estimated ejection fraction was in the range of 60% to 65%. Wall   motion was normal; there were no regional wall motion   abnormalities. Features are consistent with a pseudonormal left   ventricular filling pattern, with concomitant abnormal relaxation   and increased filling pressure (grade 2 diastolic dysfunction).   Doppler parameters are consistent with elevated ventricular   end-diastolic filling pressure. - Aortic valve: Trileaflet; normal thickness leaflets. There was no   regurgitation. - Aortic root: The aortic root was normal in size. - Ascending aorta: The ascending aorta was normal in size. - Mitral valve: Structurally normal valve. Valve area by pressure   half-time: 1.76 cm^2. Valve area by continuity equation (using   LVOT flow): 2.48 cm^2. - Left atrium: The atrium was moderately dilated. - Right ventricle: The cavity size was normal. Wall thickness was   normal. Systolic function was normal. - Right atrium: The atrium was normal in size. - Tricuspid valve: There was no regurgitation. - Pulmonary arteries: Systolic pressure was within the normal   range. - Inferior vena cava: The vessel was normal in size. - Pericardium, extracardiac: A mild pericardial effusion was   identified posterior to the heart. Features were not consistent   with tamponade physiology.    ASSESSMENT:    1. Paroxysmal atrial fibrillation (HCC)   2. Essential hypertension   3. Long-term use of high-risk medication   4. Morbid obesity ([place])      PLAN:  In order of problems listed above:  Atrial fibrillation/flutter  - Noted on ECG 01/12/17 with associated palpitations.  - She thinks it may correlate with the Mybetriq  - She believes that she has only been in atrial fibrillation approximately 3 weeks. We discussed  the risk factors including age, obesity.  - I will check an echocardiogram.  - Her platelet count at last check in April was 70,000. Before this it was in normal range, 162. I will recheck CBC before starting anticoagulation.  - If her platelet  count is normal, start anticoagulation and schedule cardioversion 3 weeks after administration. We will try to give her an opportunity at sinus rhythm.  - I did express to her that this may be challenging given her risk factors.  - Overall she is well rate controlled currently.  Thrombocytopenia  - Recheck platelets. She has seen Dr. Katheran Awe in the past after her PE  - I think utilization of Eliquis 5 mg twice a day would be useful. Stroke prevention key.  Chronic diastolic heart failure  - Mostly related to obesity. Echocardiogram as above. Exercise. 1-1.5 L fluid restriction as well as salt restriction.  Morbid obesity  - Weight loss very important.  History of PE  - Last CT scan reassuring.  Chronic kidney disease stage IV  - Dr. Pearson Grippe. It has stabilized.  Diabetes with hypertension  - Per primary team.  We will schedule follow-up after lab work.   Medication Adjustments/Labs and Tests Ordered: Current medicines are reviewed at length with the patient today.  Concerns regarding medicines are outlined above.  Medication changes, Labs and Tests ordered today are listed in the Patient Instructions below. Patient Instructions  Medication Instructions:  The current medical regimen is effective;  continue present plan and medications.  Labwork: Please have blood work today (CBC)  Testing/Procedures: Your physician has requested that you have an echocardiogram. Echocardiography is a painless test that uses sound waves to create images of your heart. It provides your doctor with information about the size and shape of your heart and how well your heart's chambers and valves are working. This procedure takes approximately one hour.  There are no restrictions for this procedure.  Follow-Up: Further follow up will be scheduled at a later date based on the results of the above testing.  If you need a refill on your cardiac medications before your next appointment, please call your pharmacy.  Thank you for choosing [facility]!!          Signed, [name], MD  01/25/2017 5:05 PM    [address], [place], [place]  [address] Phone: [phone]; Fax: [phone]

## 2017-01-13 LAB — CBC
Hematocrit: 34.3 % (ref 34.0–46.6)
Hemoglobin: 11.4 g/dL (ref 11.1–15.9)
MCH: 31.1 pg (ref 26.6–33.0)
MCHC: 33.2 g/dL (ref 31.5–35.7)
MCV: 94 fL (ref 79–97)
Platelets: 93 10*3/uL — CL (ref 150–379)
RBC: 3.67 x10E6/uL — ABNORMAL LOW (ref 3.77–5.28)
RDW: 17.9 % — ABNORMAL HIGH (ref 12.3–15.4)
WBC: 5.9 10*3/uL (ref 3.4–10.8)

## 2017-01-14 ENCOUNTER — Other Ambulatory Visit: Payer: Self-pay | Admitting: *Deleted

## 2017-01-14 DIAGNOSIS — D696 Thrombocytopenia, unspecified: Secondary | ICD-10-CM

## 2017-01-18 ENCOUNTER — Other Ambulatory Visit: Payer: Self-pay | Admitting: Family

## 2017-01-18 ENCOUNTER — Other Ambulatory Visit (HOSPITAL_BASED_OUTPATIENT_CLINIC_OR_DEPARTMENT_OTHER): Payer: Medicare Other

## 2017-01-18 ENCOUNTER — Ambulatory Visit (HOSPITAL_BASED_OUTPATIENT_CLINIC_OR_DEPARTMENT_OTHER): Payer: Medicare Other | Admitting: Family

## 2017-01-18 ENCOUNTER — Ambulatory Visit: Payer: Medicare Other

## 2017-01-18 ENCOUNTER — Other Ambulatory Visit: Payer: Self-pay

## 2017-01-18 VITALS — BP 145/75 | HR 128 | Temp 98.1°F | Resp 20 | Wt 248.4 lb

## 2017-01-18 DIAGNOSIS — Z86718 Personal history of other venous thrombosis and embolism: Secondary | ICD-10-CM | POA: Diagnosis not present

## 2017-01-18 DIAGNOSIS — Z86711 Personal history of pulmonary embolism: Secondary | ICD-10-CM

## 2017-01-18 DIAGNOSIS — K760 Fatty (change of) liver, not elsewhere classified: Secondary | ICD-10-CM

## 2017-01-18 DIAGNOSIS — D696 Thrombocytopenia, unspecified: Secondary | ICD-10-CM | POA: Diagnosis not present

## 2017-01-18 DIAGNOSIS — I4891 Unspecified atrial fibrillation: Secondary | ICD-10-CM | POA: Diagnosis not present

## 2017-01-18 LAB — COMPREHENSIVE METABOLIC PANEL
ALT: 18 U/L (ref 0–55)
AST: 14 U/L (ref 5–34)
Albumin: 4 g/dL (ref 3.5–5.0)
Alkaline Phosphatase: 63 U/L (ref 40–150)
Anion Gap: 12 mEq/L — ABNORMAL HIGH (ref 3–11)
BUN: 29.2 mg/dL — ABNORMAL HIGH (ref 7.0–26.0)
CO2: 25 mEq/L (ref 22–29)
Calcium: 9.9 mg/dL (ref 8.4–10.4)
Chloride: 103 mEq/L (ref 98–109)
Creatinine: 1.7 mg/dL — ABNORMAL HIGH (ref 0.6–1.1)
EGFR: 28 mL/min/{1.73_m2} — ABNORMAL LOW (ref >90)
Glucose: 296 mg/dl — ABNORMAL HIGH (ref 70–140)
Potassium: 4.6 mEq/L (ref 3.5–5.1)
Sodium: 140 mEq/L (ref 136–145)
Total Bilirubin: 0.7 mg/dL (ref 0.20–1.20)
Total Protein: 7.4 g/dL (ref 6.4–8.3)

## 2017-01-18 LAB — CBC WITH DIFFERENTIAL (CANCER CENTER ONLY)
BASO#: 0 10*3/uL (ref 0.0–0.2)
BASO%: 0.2 % (ref 0.0–2.0)
EOS%: 3.7 % (ref 0.0–7.0)
Eosinophils Absolute: 0.2 10*3/uL (ref 0.0–0.5)
HCT: 33.5 % — ABNORMAL LOW (ref 34.8–46.6)
HGB: 11.2 g/dL — ABNORMAL LOW (ref 11.6–15.9)
LYMPH#: 1.1 10*3/uL (ref 0.9–3.3)
LYMPH%: 20.5 % (ref 14.0–48.0)
MCH: 32.1 pg (ref 26.0–34.0)
MCHC: 33.4 g/dL (ref 32.0–36.0)
MCV: 96 fL (ref 81–101)
MONO#: 0.3 10*3/uL (ref 0.1–0.9)
MONO%: 6.4 % (ref 0.0–13.0)
NEUT#: 3.5 10*3/uL (ref 1.5–6.5)
NEUT%: 69.2 % (ref 39.6–80.0)
Platelets: 63 10*3/uL — ABNORMAL LOW (ref 145–400)
RBC: 3.49 10*6/uL — ABNORMAL LOW (ref 3.70–5.32)
RDW: 17.8 % — ABNORMAL HIGH (ref 11.1–15.7)
WBC: 5.1 10*3/uL (ref 3.9–10.0)

## 2017-01-18 LAB — CHCC SATELLITE - SMEAR

## 2017-01-18 MED ORDER — APIXABAN 5 MG PO TABS: 5.0000 mg | ORAL_TABLET | Freq: Two times a day (BID) | ORAL | 3 refills | Status: DC

## 2017-01-18 NOTE — Progress Notes (Addendum)
Hematology/Oncology Consultation   Name: [name]      MRN: [id number]    Location: Room/bed info not found  Date: 01/18/2017 Time:1:17 PM   REFERRING PHYSICIAN: [name], MD  REASON FOR CONSULT: Thrombocytopenia   DIAGNOSIS:  1. History of PE 2. Thrombocytopenia  HISTORY OF PRESENT ILLNESS: [name] is a very pleasant 82 yo caucasian female with history of PE in 2010 treated with 2 full years of Coumadin. She has had no recurrent thrombus. She mentions that she is allergic to aspirin (tongue swelling).  She was previously seen by Dr. Marin [name] in 2016 for history of thrombus and is back now to re-establish care.   She states that she recently developed atrial fib possibly medication induced (Myrbetriq). She stopped this medication 3 days ago. EKG today shows patient in controlled atrial fib. She has occasional palpitations. No c/o SOB at this time.  Her cardiologist is wanting to start her on anticoagulation for thrombus prevention but concerned with her thrombocytopenia. Platelet count is 63 today. Hgb is stable at 11.2 and WBC count 5.1.  They are also considering cardioversion. She is scheduled to have an ECHO next week on the 21st.  Family history of thrombus includes grandmother, mother and maternal uncle. Her mother had an ischemic stroke. The patient is quite concerned about having a stroke while driving or some other activity and causing harm to someone else.  TSH in April of this year was therapeutic. She takes Synthroid daily.  No episodes of bleeding, bruising or petechiae. No lymphadenopathy f ound on exam.  Renal US in 2016 showed hepatic steatosis.  She states that she suffered a stress MI while hospitalized with urosepsis and CKD stage 3 (renal calculi and stent placement). She is followed by nephrology every 6 months.  She has had no issue with frequent infections. No fever, chills, n/v, cough,r ash, dizziness, SOB, chest pain, abdominal pain or changes in bowel or  bladder habits.  No numbness or tingling in her extremities. She has chronic puffiness in both feet and ankles that waxes and wanes. She watches her sodium intake and takes lasix daily.  She has maintained a good appetite and is staying well hydrated. Her weight is stable.  She is diabetic and states that her blood sugars have been fairly well controlled. Her last Hgb A1c was 7.0.  She is typically quite active doing yoga, stationary bicycle and swimming. She uses a cane or walker when ambulating for support. She denies any recent falls, no syncopal episodes.  She is a former smoker at 1/2 ppd and quit 26 years ago. She does not drink alcoholic beverages.  She is retired. 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   ROS: All other 10 point review of systems is negative.   PAST MEDICAL HISTORY:   Past Medical History:  Diagnosis Date  . Allergic rhinitis   . Anxiety state, unspecified    panic attacks  . CHF (congestive heart failure) ([place])   . Depressive disorder, not elsewhere classified   . Extrinsic asthma, unspecified    no problem since adulthood  . Obesity   . OSA on CPAP    severe  . Pure hypercholesterolemia   . Respiratory failure with hypoxia ([place]) 09/2008   acute, secondary to multiple bilateral pulmonary embolism , negative hypercoagulable workup 09/2008 hospital stay  . Scoliosis   . Syncope and collapse   . Type II or unspecified type diabetes mellitus without mention of complication, not  stated as uncontrolled   . Unspecified essential hypertension   . Unspecified hypothyroidism    hypo    ALLERGIES: Allergies  Allergen Reactions  . Metformin Diarrhea  . Pineapple Swelling    Throat swells and blisters on tongue and roof of mouth per patient  . Tetracycline Hives  . Aspirin Itching and Rash  . Contrast Media [Iodinated Diagnostic Agents] Itching       . Flagyl [Metronidazole] Other (See Comments)    Unknown   . Fluticasone-Salmeterol Itching and Rash  .  Lactose Intolerance (Gi) Other (See Comments)    gas  . Latex Itching and Rash  . Lisinopril Cough  . Sulfonamide Derivatives Itching and Rash      MEDICATIONS:  Current Outpatient Prescriptions on File Prior to Visit  Medication Sig Dispense Refill  . atorvastatin (LIPITOR) 80 MG tablet Take 1 tablet (80 mg total) by mouth daily. 90 tablet 3  . BIOTIN PO Take 1 tablet by mouth daily.    . Cholecalciferol (VITAMIN D3) 2000 units TABS Take 2,000 Units by mouth daily.    . Coenzyme Q10 200 MG capsule Take 200 mg by mouth daily.      . Cyanocobalamin (VITAMIN B 12 PO) Take 1 tablet by mouth daily.    Marland Kitchen docusate sodium (COLACE) 100 MG capsule Take 1 capsule by mouth daily.    . fexofenadine (ALLEGRA) 180 MG tablet Take 180 mg by mouth daily.      . furosemide (LASIX) 20 MG tablet Take 2 tablets (40 mg total) by mouth daily. 180 tablet 3  . glipiZIDE (GLUCOTROL) 5 MG tablet Take 1 tablet (5 mg total) by mouth 2 (two) times daily before a meal. 180 tablet 3  . insulin NPH-regular Human (HUMULIN 70/30) (70-30) 100 UNIT/ML injection INJECT 70 UNITS UNDER THE SKIN AS DIRECTED EVERY MORNING, AND 65 UNITS EVERY EVENING 30 mL 11  . Insulin Pen Needle (B-D UF III MINI PEN NEEDLES) 31G X 5 MM MISC Use daily for insulin injection 100 each 2  . levothyroxine (SYNTHROID, LEVOTHROID) 112 MCG tablet Take 1 tablet (112 mcg total) by mouth daily. 90 tablet 3  . mirabegron ER (MYRBETRIQ) 50 MG TB24 tablet Take 50 mg by mouth daily.    . Multiple Vitamin (MULTIVITAMIN) tablet Take 1 tablet by mouth daily.      . potassium citrate (UROCIT-K) 10 MEQ (1080 MG) SR tablet Take 1 tablet (10 mEq total) by mouth 3 (three) times daily with meals. 270 tablet 3  . potassium citrate (UROCIT-K) 10 MEQ (1080 MG) SR tablet Take 20 mEq by mouth 2 (two) times daily.    . Probiotic Product (PROBIOTIC PO) Take 1 capsule by mouth daily.    . ranitidine (ZANTAC) 150 MG tablet Take 150 mg by mouth daily as needed for heartburn.    .  sertraline (ZOLOFT) 50 MG tablet Take 1 tablet (50 mg total) by mouth daily. 90 tablet 1  . vitamin C (ASCORBIC ACID) 250 MG tablet Take 250 mg by mouth daily.     No current facility-administered medications on file prior to visit.      PAST SURGICAL HISTORY Past Surgical History:  Procedure Laterality Date  . EYE SURGERY    . JOINT REPLACEMENT    . knee replaced    . THYROID SURGERY      FAMILY HISTORY: Family History  Problem Relation Age of Onset  . Allergies Mother   . Clotting disorder Mother   . Osteoarthritis Mother   .  Asthma Mother   . Arthritis Other   . Diabetes Other   . Hyperlipidemia Other   . Hypertension Other   . Coronary artery disease Other   . Stroke Other   . Osteoarthritis Daughter   . Rheum arthritis Maternal Grandmother   . Clotting disorder Maternal Grandmother   . Clotting disorder Maternal Uncle   . Clotting disorder Daughter   . Allergies Daughter     SOCIAL HISTORY:  reports that she quit smoking about 27 years ago. She quit after 20.00 years of use. She has never used smokeless tobacco. She reports that she does not drink alcohol or use drugs.  PERFORMANCE STATUS: The patient's performance status is 1 - Symptomatic but completely ambulatory  PHYSICAL EXAM: Most Recent Vital Signs: Blood pressure (!) 145/75, pulse (!) 128, temperature 98.1 F (36.7 C), temperature source Oral, resp. rate 20, weight 248 lb 6.4 oz (112.7 kg), SpO2 94 %. BP (!) 145/75 (BP Location: Left Arm, Patient Position: Sitting)   Pulse (!) 128   Temp 98.1 F (36.7 C) (Oral)   Resp 20   Wt 248 lb 6.4 oz (112.7 kg)   SpO2 94%   BMI 46.17 kg/m   General Appearance:    Alert, cooperative, no distress, appears stated age  Head:    Normocephalic, without obvious abnormality, atraumatic  Eyes:    PERRL, conjunctiva/corneas clear, EOM's intact, fundi    benign, both eyes        Throat:   Lips, mucosa, and tongue normal; teeth and gums normal  Neck:   Supple,  symmetrical, trachea midline, no adenopathy;    thyroid:  no enlargement/tenderness/nodules; no carotid   bruit or JVD  Back:     Symmetric, no curvature, ROM normal, no CVA tenderness  Lungs:     Clear to auscultation bilaterally, respirations unlabored  Chest Wall:    No tenderness or deformity   Heart:    Irregular rate and rhythm, atrial fib, controlled, no murmur, rub or gallop     Abdomen:     Soft, non-tender, bowel sounds active all four quadrants,    no masses, no organomegaly        Extremities:   Extremities normal, atraumatic, no cyanosis or edema  Pulses:   2+ and symmetric all extremities  Skin:   Skin color, texture, turgor normal, no rashes or lesions  Lymph nodes:   Cervical, supraclavicular, and axillary nodes normal  Neurologic:   CNII-XII intact, normal strength, sensation and reflexes    throughout    LABORATORY DATA:  Results for orders placed or performed in visit on 01/18/17 (from the past 48 hour(s))  CBC w/Diff     Status: Abnormal   Collection Time: 01/18/17 11:05 AM  Result Value Ref Range   WBC 5.1 3.9 - 10.0 10e3/uL   RBC 3.49 (L) 3.70 - 5.32 10e6/uL   HGB 11.2 (L) 11.6 - 15.9 g/dL   HCT 33.5 (L) 34.8 - 46.6 %   MCV 96 81 - 101 fL   MCH 32.1 26.0 - 34.0 pg   MCHC 33.4 32.0 - 36.0 g/dL   RDW 17.8 (H) 11.1 - 15.7 %   Platelets 63 Platelet count consistent in citrate (L) 145 - 400 10e3/uL   NEUT# 3.5 1.5 - 6.5 10e3/uL   LYMPH# 1.1 0.9 - 3.3 10e3/uL   MONO# 0.3 0.1 - 0.9 10e3/uL   Eosinophils Absolute 0.2 0.0 - 0.5 10e3/uL   BASO# 0.0 0.0 - 0.2 10e3/uL  NEUT% 69.2 39.6 - 80.0 %   LYMPH% 20.5 14.0 - 48.0 %   MONO% 6.4 0.0 - 13.0 %   EOS% 3.7 0.0 - 7.0 %   BASO% 0.2 0.0 - 2.0 %  Smear     Status: None   Collection Time: 01/18/17 11:05 AM  Result Value Ref Range   Smear Result Smear Available       RADIOGRAPHY: No results found.     PATHOLOGY: None   ASSESSMENT/PLAN: Ms. Pilant is a very pleasant 82 yo caucasian female with history of PE  in 2010 treated with 2 full years of Coumadin and has not been on anticoagulation or aspirin (allergy) since. She recently developed atrial fib possibly due to a medication. She currently has thrombocytopenia possibly due in part to hepatic steatosis. Cardiology would like clearance from hematology to start her on an anticoagulant.  Platelet count is 63 today with Hgb of 11.2. She has had no bleeding and has been on Coumadin without issue in the past.  We will get an US of the abdomen to reassess both her spleen and liver.  From our standpoint the patient is ok to start treatment with Eliquis which we have ordered today. We will plan to check lab work weekly for now with MD follow-up in 1 month.  Hopefully, once she has been cardioverted she will return to NSR and lifelong anticoagulation will not be required.   All questions were answered. The patient will contact our office with any other questions or concerns. We can certainly see her much sooner if necessary.  She was discussed with and also seen by Dr. Marin [name] and he is in agreement with the aforementioned.   [facility]    Addendum:  I saw and examined Ms. Faso with Judson Roch. It was nice to see her again.  I have to believe that her thrombocytopenia is related to hepatic steatosis and possibly splenomegaly. She has diabetes.  She had an EKG in the office. She is still in atrial fibrillation.  I do believe that it would be okay for her to go on to [facility]. I would think that if the atrial fibrillation is from her medications, then the age fibrillation should resolve once the medication is stopped. She was on Myrbetriq. She's been off his now for about 5 days.  I have to say that her platelet count has dropped quite a bit since last week. I'm not sure exactly what might be going on that is also "at play".  I think that her risk of stroke is greater than her risk of bleeding. As such, I think that ELIQUIS would be reasonable.  We  have to follow her platelet count weekly.   We spent about 40 minutes with her today.  [name], MD

## 2017-01-19 ENCOUNTER — Ambulatory Visit (HOSPITAL_BASED_OUTPATIENT_CLINIC_OR_DEPARTMENT_OTHER)
Admission: RE | Admit: 2017-01-19 | Discharge: 2017-01-19 | Disposition: A | Discharge status: Home / Self Care | Payer: Medicare Other | Source: Ambulatory Visit | Attending: Family | Admitting: Family

## 2017-01-19 DIAGNOSIS — N281 Cyst of kidney, acquired: Secondary | ICD-10-CM | POA: Diagnosis not present

## 2017-01-19 DIAGNOSIS — R161 Splenomegaly, not elsewhere classified: Secondary | ICD-10-CM | POA: Diagnosis not present

## 2017-01-19 DIAGNOSIS — K760 Fatty (change of) liver, not elsewhere classified: Secondary | ICD-10-CM | POA: Diagnosis not present

## 2017-01-19 DIAGNOSIS — K8020 Calculus of gallbladder without cholecystitis without obstruction: Secondary | ICD-10-CM | POA: Insufficient documentation

## 2017-01-20 LAB — CARDIOLIPIN ANTIBODIES, IGG, IGM, IGA
Anticardiolipin Ab,IgA,Qn: <9 APL U/mL (ref 0–11)
Anticardiolipin Ab,IgG,Qn: <9 GPL U/mL (ref 0–14)
Anticardiolipin Ab,IgM,Qn: 35 MPL U/mL — ABNORMAL HIGH (ref 0–12)

## 2017-01-22 ENCOUNTER — Other Ambulatory Visit: Payer: Self-pay | Admitting: Family

## 2017-01-25 ENCOUNTER — Other Ambulatory Visit: Payer: Self-pay | Admitting: Family

## 2017-01-25 ENCOUNTER — Other Ambulatory Visit: Payer: Self-pay

## 2017-01-25 ENCOUNTER — Ambulatory Visit (HOSPITAL_COMMUNITY): Payer: Medicare Other | Attending: Cardiology

## 2017-01-25 DIAGNOSIS — I10 Essential (primary) hypertension: Secondary | ICD-10-CM | POA: Diagnosis not present

## 2017-01-25 DIAGNOSIS — I480 Paroxysmal atrial fibrillation: Secondary | ICD-10-CM | POA: Diagnosis not present

## 2017-01-25 DIAGNOSIS — D693 Immune thrombocytopenic purpura: Secondary | ICD-10-CM | POA: Insufficient documentation

## 2017-01-25 DIAGNOSIS — I340 Nonrheumatic mitral (valve) insufficiency: Secondary | ICD-10-CM | POA: Diagnosis not present

## 2017-01-25 DIAGNOSIS — D696 Thrombocytopenia, unspecified: Secondary | ICD-10-CM

## 2017-01-26 ENCOUNTER — Other Ambulatory Visit (HOSPITAL_BASED_OUTPATIENT_CLINIC_OR_DEPARTMENT_OTHER): Payer: Medicare Other

## 2017-01-26 ENCOUNTER — Other Ambulatory Visit: Payer: Self-pay | Admitting: Adult Health

## 2017-01-26 ENCOUNTER — Telehealth: Payer: Self-pay | Admitting: Cardiology

## 2017-01-26 DIAGNOSIS — Z86711 Personal history of pulmonary embolism: Secondary | ICD-10-CM | POA: Diagnosis not present

## 2017-01-26 DIAGNOSIS — I4891 Unspecified atrial fibrillation: Secondary | ICD-10-CM

## 2017-01-26 DIAGNOSIS — K760 Fatty (change of) liver, not elsewhere classified: Secondary | ICD-10-CM

## 2017-01-26 DIAGNOSIS — D696 Thrombocytopenia, unspecified: Secondary | ICD-10-CM | POA: Diagnosis present

## 2017-01-26 DIAGNOSIS — Z760 Encounter for issue of repeat prescription: Secondary | ICD-10-CM

## 2017-01-26 LAB — CBC WITH DIFFERENTIAL (CANCER CENTER ONLY)
BASO#: 0 10*3/uL (ref 0.0–0.2)
BASO%: 0.3 % (ref 0.0–2.0)
EOS%: 3.3 % (ref 0.0–7.0)
Eosinophils Absolute: 0.2 10*3/uL (ref 0.0–0.5)
HCT: 34.1 % — ABNORMAL LOW (ref 34.8–46.6)
HGB: 11.4 g/dL — ABNORMAL LOW (ref 11.6–15.9)
LYMPH#: 1.1 10*3/uL (ref 0.9–3.3)
LYMPH%: 19.6 % (ref 14.0–48.0)
MCH: 32 pg (ref 26.0–34.0)
MCHC: 33.4 g/dL (ref 32.0–36.0)
MCV: 96 fL (ref 81–101)
MONO#: 0.3 10*3/uL (ref 0.1–0.9)
MONO%: 5.2 % (ref 0.0–13.0)
NEUT#: 4.1 10*3/uL (ref 1.5–6.5)
NEUT%: 71.6 % (ref 39.6–80.0)
Platelets: 63 10*3/uL — ABNORMAL LOW (ref 145–400)
RBC: 3.56 10*6/uL — ABNORMAL LOW (ref 3.70–5.32)
RDW: 18.2 % — ABNORMAL HIGH (ref 11.1–15.7)
WBC: 5.8 10*3/uL (ref 3.9–10.0)

## 2017-01-26 NOTE — Telephone Encounter (Signed)
Called patient's daughter and patient back. Discussed echocardiogram results and possible cardioversion in the future, after take eliquis for several weeks. Patient is concerned about her irregular heart rate going from 130 to 50. Informed patient that she might be in and out of A. FIB, but without monitoring her heart continuously, it is hard to tell. Patient stated she did have some chest discomfort last night and took zantac, which helped. Informed patient if chest discomfort comes back without relief from zantac give our office a call. Patient is very anxious about her echo results and this new dx of A. FIB. Made patient an appointment with Dr. Marlou Porch within a month to discuss echo and cardioversion. Informed patient that her message would be sent to Dr. Marlou Porch for further advisement.

## 2017-01-26 NOTE — Telephone Encounter (Signed)
Please follow up with her to once again discuss. Please set up cardioversion 3 weeks after starting Eliquis. Thanks  [name], MD

## 2017-01-26 NOTE — Telephone Encounter (Signed)
Haynes Kerns ( Daughter) is calling to find out the results of the Echo that was done on 01/25/17 . Mrs. Schwegel did not understand when the Nurse called her on last night . Please call   Thanks

## 2017-01-27 LAB — COMPREHENSIVE METABOLIC PANEL (CC13)
ALT: 14 IU/L (ref 0–32)
AST (SGOT): 17 IU/L (ref 0–40)
Albumin, Serum: 4.1 g/dL (ref 3.5–4.8)
Albumin/Globulin Ratio: 1.3 (ref 1.2–2.2)
Alkaline Phosphatase, S: 58 IU/L (ref 39–117)
BUN/Creatinine Ratio: 19 (ref 12–28)
BUN: 30 mg/dL — ABNORMAL HIGH (ref 8–27)
Bilirubin Total: 0.6 mg/dL (ref 0.0–1.2)
Calcium, Ser: 10.1 mg/dL (ref 8.7–10.3)
Carbon Dioxide, Total: 21 mmol/L (ref 18–29)
Chloride, Ser: 97 mmol/L (ref 96–106)
Creatinine, Ser: 1.54 mg/dL — ABNORMAL HIGH (ref 0.57–1.00)
GFR calc Af Amer: 37 mL/min/{1.73_m2} — ABNORMAL LOW (ref >59)
GFR calc non Af Amer: 32 mL/min/{1.73_m2} — ABNORMAL LOW (ref >59)
Globulin, Total: 3.2 g/dL (ref 1.5–4.5)
Glucose: 290 mg/dL — ABNORMAL HIGH (ref 65–99)
Potassium, Ser: 4.7 mmol/L (ref 3.5–5.2)
Sodium: 138 mmol/L (ref 134–144)
Total Protein: 7.3 g/dL (ref 6.0–8.5)

## 2017-01-29 NOTE — Telephone Encounter (Signed)
Spoke with pt who states all of her questions have been answered.  She wants to wait on scheduling the cardioversion until after her company leaves sometime around the middle of June.  She is scheduled to see Dr Marlou Porch 6/26 and scheduling will occur then per her request. She is aware to not miss any doses of Eliquis.

## 2017-02-02 ENCOUNTER — Other Ambulatory Visit (HOSPITAL_BASED_OUTPATIENT_CLINIC_OR_DEPARTMENT_OTHER): Payer: Medicare Other

## 2017-02-02 DIAGNOSIS — Z86711 Personal history of pulmonary embolism: Secondary | ICD-10-CM | POA: Diagnosis not present

## 2017-02-02 DIAGNOSIS — I4891 Unspecified atrial fibrillation: Secondary | ICD-10-CM | POA: Diagnosis not present

## 2017-02-02 DIAGNOSIS — K760 Fatty (change of) liver, not elsewhere classified: Secondary | ICD-10-CM | POA: Diagnosis not present

## 2017-02-02 DIAGNOSIS — D696 Thrombocytopenia, unspecified: Secondary | ICD-10-CM | POA: Diagnosis present

## 2017-02-02 LAB — COMPREHENSIVE METABOLIC PANEL (CC13)
ALT: 13 IU/L (ref 0–32)
AST (SGOT): 13 IU/L (ref 0–40)
Albumin, Serum: 4.2 g/dL (ref 3.5–4.8)
Albumin/Globulin Ratio: 1.5 (ref 1.2–2.2)
Alkaline Phosphatase, S: 65 IU/L (ref 39–117)
BUN/Creatinine Ratio: 22 (ref 12–28)
BUN: 38 mg/dL — ABNORMAL HIGH (ref 8–27)
Bilirubin Total: 0.7 mg/dL (ref 0.0–1.2)
Calcium, Ser: 10 mg/dL (ref 8.7–10.3)
Carbon Dioxide, Total: 31 mmol/L — ABNORMAL HIGH (ref 18–29)
Chloride, Ser: 100 mmol/L (ref 96–106)
Creatinine, Ser: 1.73 mg/dL — ABNORMAL HIGH (ref 0.57–1.00)
GFR calc Af Amer: 32 mL/min/{1.73_m2} — ABNORMAL LOW (ref >59)
GFR calc non Af Amer: 28 mL/min/{1.73_m2} — ABNORMAL LOW (ref >59)
Globulin, Total: 2.8 g/dL (ref 1.5–4.5)
Glucose: 268 mg/dL — ABNORMAL HIGH (ref 65–99)
Potassium, Ser: 4.8 mmol/L (ref 3.5–5.2)
Sodium: 135 mmol/L (ref 134–144)
Total Protein: 7 g/dL (ref 6.0–8.5)

## 2017-02-02 LAB — CBC WITH DIFFERENTIAL (CANCER CENTER ONLY)
BASO#: 0 10*3/uL (ref 0.0–0.2)
BASO%: 0.4 % (ref 0.0–2.0)
EOS%: 3.5 % (ref 0.0–7.0)
Eosinophils Absolute: 0.2 10*3/uL (ref 0.0–0.5)
HCT: 30.2 % — ABNORMAL LOW (ref 34.8–46.6)
HGB: 10.2 g/dL — ABNORMAL LOW (ref 11.6–15.9)
LYMPH#: 1.1 10*3/uL (ref 0.9–3.3)
LYMPH%: 19.4 % (ref 14.0–48.0)
MCH: 32.4 pg (ref 26.0–34.0)
MCHC: 33.8 g/dL (ref 32.0–36.0)
MCV: 96 fL (ref 81–101)
MONO#: 0.3 10*3/uL (ref 0.1–0.9)
MONO%: 6.2 % (ref 0.0–13.0)
NEUT#: 3.9 10*3/uL (ref 1.5–6.5)
NEUT%: 70.5 % (ref 39.6–80.0)
Platelets: 42 10*3/uL — ABNORMAL LOW (ref 145–400)
RBC: 3.15 10*6/uL — ABNORMAL LOW (ref 3.70–5.32)
RDW: 18.4 % — ABNORMAL HIGH (ref 11.1–15.7)
WBC: 5.5 10*3/uL (ref 3.9–10.0)

## 2017-02-09 ENCOUNTER — Encounter: Payer: Self-pay | Admitting: Cardiology

## 2017-02-09 ENCOUNTER — Other Ambulatory Visit (HOSPITAL_BASED_OUTPATIENT_CLINIC_OR_DEPARTMENT_OTHER): Payer: Medicare Other

## 2017-02-09 DIAGNOSIS — D696 Thrombocytopenia, unspecified: Secondary | ICD-10-CM | POA: Diagnosis present

## 2017-02-09 DIAGNOSIS — Z86711 Personal history of pulmonary embolism: Secondary | ICD-10-CM | POA: Diagnosis not present

## 2017-02-09 DIAGNOSIS — K760 Fatty (change of) liver, not elsewhere classified: Secondary | ICD-10-CM | POA: Diagnosis not present

## 2017-02-09 DIAGNOSIS — I4891 Unspecified atrial fibrillation: Secondary | ICD-10-CM

## 2017-02-09 LAB — CBC WITH DIFFERENTIAL (CANCER CENTER ONLY)
BASO#: 0 10*3/uL (ref 0.0–0.2)
BASO%: 0.2 % (ref 0.0–2.0)
EOS%: 2.8 % (ref 0.0–7.0)
Eosinophils Absolute: 0.2 10*3/uL (ref 0.0–0.5)
HCT: 25.7 % — ABNORMAL LOW (ref 34.8–46.6)
HGB: 8.5 g/dL — ABNORMAL LOW (ref 11.6–15.9)
LYMPH#: 1.2 10*3/uL (ref 0.9–3.3)
LYMPH%: 19.1 % (ref 14.0–48.0)
MCH: 32.6 pg (ref 26.0–34.0)
MCHC: 33.1 g/dL (ref 32.0–36.0)
MCV: 99 fL (ref 81–101)
MONO#: 0.5 10*3/uL (ref 0.1–0.9)
MONO%: 7.4 % (ref 0.0–13.0)
NEUT#: 4.6 10*3/uL (ref 1.5–6.5)
NEUT%: 70.5 % (ref 39.6–80.0)
Platelets: 53 10*3/uL — ABNORMAL LOW (ref 145–400)
RBC: 2.61 10*6/uL — ABNORMAL LOW (ref 3.70–5.32)
RDW: 19 % — ABNORMAL HIGH (ref 11.1–15.7)
WBC: 6.5 10*3/uL (ref 3.9–10.0)

## 2017-02-09 LAB — COMPREHENSIVE METABOLIC PANEL (CC13)
ALT: 13 IU/L (ref 0–32)
AST (SGOT): 13 IU/L (ref 0–40)
Albumin, Serum: 4.1 g/dL (ref 3.5–4.8)
Albumin/Globulin Ratio: 1.5 (ref 1.2–2.2)
Alkaline Phosphatase, S: 63 IU/L (ref 39–117)
BUN/Creatinine Ratio: 19 (ref 12–28)
BUN: 32 mg/dL — ABNORMAL HIGH (ref 8–27)
Bilirubin Total: 0.6 mg/dL (ref 0.0–1.2)
Calcium, Ser: 8.5 mg/dL — ABNORMAL LOW (ref 8.7–10.3)
Carbon Dioxide, Total: 27 mmol/L (ref 18–29)
Chloride, Ser: 101 mmol/L (ref 96–106)
Creatinine, Ser: 1.72 mg/dL — ABNORMAL HIGH (ref 0.57–1.00)
GFR calc Af Amer: 32 mL/min/{1.73_m2} — ABNORMAL LOW (ref >59)
GFR calc non Af Amer: 28 mL/min/{1.73_m2} — ABNORMAL LOW (ref >59)
Globulin, Total: 2.7 g/dL (ref 1.5–4.5)
Glucose: 282 mg/dL — ABNORMAL HIGH (ref 65–99)
Potassium, Ser: 4.4 mmol/L (ref 3.5–5.2)
Sodium: 138 mmol/L (ref 134–144)
Total Protein: 6.8 g/dL (ref 6.0–8.5)

## 2017-02-10 ENCOUNTER — Telehealth: Payer: Self-pay | Admitting: *Deleted

## 2017-02-10 NOTE — Telephone Encounter (Addendum)
-----   Message from Volanda Napoleon, MD sent at 02/10/2017  6:38 AM EDT ----- Call - your blood sugars are terrible. If this is not fixed, your platelets will not be an issue as complications from diabetes will be a much bigger issue!!! Routed recent labs to patient primary care provide. [employer].

## 2017-02-11 ENCOUNTER — Emergency Department (HOSPITAL_COMMUNITY): Payer: Medicare Other

## 2017-02-11 ENCOUNTER — Telehealth: Payer: Self-pay | Admitting: *Deleted

## 2017-02-11 ENCOUNTER — Encounter (HOSPITAL_COMMUNITY): Payer: Self-pay | Admitting: Emergency Medicine

## 2017-02-11 ENCOUNTER — Emergency Department (HOSPITAL_COMMUNITY)
Admission: EM | Admit: 2017-02-11 | Discharge: 2017-02-11 | Disposition: A | Discharge status: Home / Self Care | Payer: Medicare Other | Attending: Emergency Medicine | Admitting: Emergency Medicine

## 2017-02-11 DIAGNOSIS — Z7902 Long term (current) use of antithrombotics/antiplatelets: Secondary | ICD-10-CM | POA: Diagnosis not present

## 2017-02-11 DIAGNOSIS — E039 Hypothyroidism, unspecified: Secondary | ICD-10-CM | POA: Insufficient documentation

## 2017-02-11 DIAGNOSIS — Z87891 Personal history of nicotine dependence: Secondary | ICD-10-CM | POA: Insufficient documentation

## 2017-02-11 DIAGNOSIS — E1122 Type 2 diabetes mellitus with diabetic chronic kidney disease: Secondary | ICD-10-CM | POA: Insufficient documentation

## 2017-02-11 DIAGNOSIS — N184 Chronic kidney disease, stage 4 (severe): Secondary | ICD-10-CM | POA: Insufficient documentation

## 2017-02-11 DIAGNOSIS — Z794 Long term (current) use of insulin: Secondary | ICD-10-CM | POA: Diagnosis not present

## 2017-02-11 DIAGNOSIS — Z91040 Latex allergy status: Secondary | ICD-10-CM | POA: Insufficient documentation

## 2017-02-11 DIAGNOSIS — I509 Heart failure, unspecified: Secondary | ICD-10-CM | POA: Diagnosis not present

## 2017-02-11 DIAGNOSIS — M7989 Other specified soft tissue disorders: Secondary | ICD-10-CM | POA: Diagnosis not present

## 2017-02-11 DIAGNOSIS — I517 Cardiomegaly: Secondary | ICD-10-CM | POA: Diagnosis not present

## 2017-02-11 DIAGNOSIS — M25571 Pain in right ankle and joints of right foot: Secondary | ICD-10-CM

## 2017-02-11 DIAGNOSIS — R9431 Abnormal electrocardiogram [ECG] [EKG]: Secondary | ICD-10-CM | POA: Diagnosis not present

## 2017-02-11 DIAGNOSIS — Z79899 Other long term (current) drug therapy: Secondary | ICD-10-CM | POA: Insufficient documentation

## 2017-02-11 DIAGNOSIS — J45909 Unspecified asthma, uncomplicated: Secondary | ICD-10-CM | POA: Insufficient documentation

## 2017-02-11 DIAGNOSIS — I130 Hypertensive heart and chronic kidney disease with heart failure and stage 1 through stage 4 chronic kidney disease, or unspecified chronic kidney disease: Secondary | ICD-10-CM | POA: Insufficient documentation

## 2017-02-11 MED ORDER — OXYCODONE-ACETAMINOPHEN 5-325 MG PO TABS
1.0000 | ORAL_TABLET | Freq: Once | ORAL | Status: AC
  Administered 2017-02-11: 1 via ORAL
  Filled 2017-02-11: qty 1

## 2017-02-11 MED ORDER — OXYCODONE-ACETAMINOPHEN 5-325 MG PO TABS: 1.0000 | ORAL_TABLET | ORAL | 0 refills | Status: DC | PRN

## 2017-02-11 NOTE — Telephone Encounter (Signed)
Patient calling the office c/o multiple symptoms she feels are related to the Eliquis.  She has severe swelling to both feet. She states they both have severe pain in them making ambulation difficult. She's also experiencing ankle pain. She has taken tylenol with minimal decrease in pain.   Reviewed symptoms with Dr Marin Olp and pharmacist and these side effects can not be found with Eliquis. Dr Marin Olp states the patient can stop the Eliquis - ONLY AFTER SPEAKING TO HER CARDIOLOGIST - if she believes it's the cause of the symptoms.   1200 - attempted to call patient back to discuss recommendations. Message left.  1500 - made second attempt to call patient. Message left.  1600 - was going to make third attempt, however chart was reviewed and patient is currently in the ED for work up for her ankle pain. Notified Dr Marin Olp.

## 2017-02-11 NOTE — ED Triage Notes (Signed)
Patient here with complaints of bilateral leg swelling. Hx of CHF. Currently new taking Eliquis, "i think that may be causing my symptoms". Pain 8/10 bilateral legs.

## 2017-02-15 ENCOUNTER — Ambulatory Visit: Payer: Medicare Other | Admitting: Hematology & Oncology

## 2017-02-15 ENCOUNTER — Other Ambulatory Visit: Payer: Medicare Other

## 2017-02-16 NOTE — Progress Notes (Deleted)
Subjective:   [name] is a 82 y.o. female who presents for an Initial Medicare Annual Wellness Visit.  The Patient was informed that the wellness visit is to identify future health risk and educate and initiate measures that can reduce risk for increased disease through the lifespan.   Describes health as fair, good or great?  Review of Systems    No ROS.  Medicare Wellness Visit. Additional risk factors are reflected in the social history.     Sleep patterns: {SX; SLEEP PATTERNS:18802::"feels rested on waking","does not get up to void","gets up *** times nightly to void","sleeps *** hours nightly"}.   Home Safety/Smoke Alarms: Feels safe in home. Smoke alarms in place.  Living environment; residence and Firearm Safety: {Rehab home environment / accessibility:30080::"no firearms","firearms stored safely"}. Seat Belt Safety/Bike Helmet: Wears seat belt.   Counseling:   Eye Exam-  Dental-  Female:   Pap- Aged out       Mammo- Aged out Dexa scan- Not on file.        CCS- last 10/08/06. No recall due to age.      Objective:    There were no vitals filed for this visit. There is no height or weight on file to calculate BMI.   Current Medications (verified) Outpatient Encounter Prescriptions as of 02/17/2017  Medication Sig  . apixaban (ELIQUIS) 5 MG TABS tablet Take 1 tablet (5 mg total) by mouth 2 (two) times daily.  Marland Kitchen atorvastatin (LIPITOR) 80 MG tablet Take 1 tablet (80 mg total) by mouth daily.  Marland Kitchen BIOTIN PO Take 1 tablet by mouth daily.  . Cholecalciferol (VITAMIN D3) 2000 units TABS Take 2,000 Units by mouth daily.  . Coenzyme Q10 200 MG capsule Take 200 mg by mouth daily.    . Cyanocobalamin (VITAMIN B 12 PO) Take 1 tablet by mouth daily.  Marland Kitchen docusate sodium (COLACE) 100 MG capsule Take 1 capsule by mouth daily.  . fexofenadine (ALLEGRA) 180 MG tablet Take 180 mg by mouth daily.    . furosemide (LASIX) 20 MG tablet Take 2 tablets (40 mg total) by mouth daily.    Marland Kitchen glipiZIDE (GLUCOTROL) 5 MG tablet Take 1 tablet (5 mg total) by mouth 2 (two) times daily before a meal.  . insulin NPH-regular Human (HUMULIN 70/30) (70-30) 100 UNIT/ML injection INJECT 70 UNITS UNDER THE SKIN AS DIRECTED EVERY MORNING, AND 65 UNITS EVERY EVENING  . Insulin Pen Needle (B-D UF III MINI PEN NEEDLES) 31G X 5 MM MISC Use daily for insulin injection  . levothyroxine (SYNTHROID, LEVOTHROID) 112 MCG tablet Take 1 tablet (112 mcg total) by mouth daily.  . mirabegron ER (MYRBETRIQ) 50 MG TB24 tablet Take 50 mg by mouth daily.  . Multiple Vitamin (MULTIVITAMIN) tablet Take 1 tablet by mouth daily.    Marland Kitchen oxyCODONE-acetaminophen (PERCOCET/ROXICET) 5-325 MG tablet Take 1 tablet by mouth every 4 (four) hours as needed for severe pain.  . potassium citrate (UROCIT-K) 10 MEQ (1080 MG) SR tablet Take 1 tablet (10 mEq total) by mouth 3 (three) times daily with meals.  . potassium citrate (UROCIT-K) 10 MEQ (1080 MG) SR tablet Take 20 mEq by mouth 2 (two) times daily.  . Probiotic Product (PROBIOTIC PO) Take 1 capsule by mouth daily.  . ranitidine (ZANTAC) 150 MG tablet Take 150 mg by mouth daily as needed for heartburn.  . sertraline (ZOLOFT) 50 MG tablet Take 1 tablet (50 mg total) by mouth daily.  . vitamin C (ASCORBIC ACID) 250 MG tablet  Take 250 mg by mouth daily.   No facility-administered encounter medications on file as of 02/17/2017.     Allergies (verified) Metformin; Pineapple; Tetracycline; Aspirin; Contrast media [iodinated diagnostic agents]; Flagyl [metronidazole]; Fluticasone-salmeterol; Lactose intolerance (gi); Latex; Lisinopril; and Sulfonamide derivatives   History: Past Medical History:  Diagnosis Date  . Allergic rhinitis   . Anxiety state, unspecified    panic attacks  . CHF (congestive heart failure) ([place])   . Depressive disorder, not elsewhere classified   . Extrinsic asthma, unspecified    no problem since adulthood  . Obesity   . OSA on CPAP    severe  .  Pure hypercholesterolemia   . Respiratory failure with hypoxia ([place]) 09/2008   acute, secondary to multiple bilateral pulmonary embolism , negative hypercoagulable workup 09/2008 hospital stay  . Scoliosis   . Syncope and collapse   . Type II or unspecified type diabetes mellitus without mention of complication, not stated as uncontrolled   . Unspecified essential hypertension   . Unspecified hypothyroidism    hypo   Past Surgical History:  Procedure Laterality Date  . EYE SURGERY    . JOINT REPLACEMENT    . knee replaced    . THYROID SURGERY     Family History  Problem Relation Age of Onset  . Allergies Mother   . Clotting disorder Mother   . Osteoarthritis Mother   . Asthma Mother   . Arthritis Other   . Diabetes Other   . Hyperlipidemia Other   . Hypertension Other   . Coronary artery disease Other   . Stroke Other   . Osteoarthritis Daughter   . Rheum arthritis Maternal Grandmother   . Clotting disorder Maternal Grandmother   . Clotting disorder Maternal Uncle   . Clotting disorder Daughter   . Allergies Daughter    Social History   Occupational History  . 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 Smoking status: Former Smoker    Years: 20.00    Quit date: 09/07/1989  . Smokeless tobacco: Never Used  . Alcohol use No  . Drug use: No  . Sexual activity: Not on file    Tobacco Counseling Counseling given: Not Answered   Activities of Daily Living No flowsheet data found.  Immunizations and Health Maintenance Immunization History  Administered Date(s) Administered  . Influenza Split 06/09/2011  . Influenza Whole 07/23/2008, 06/13/2009, 06/07/2012  . Influenza, High Dose Seasonal PF 05/08/2015  . Pneumococcal Conjugate-13 12/15/2016  . Pneumococcal Polysaccharide-23 01/30/2008  . Pneumococcal-Unspecified 09/07/2013  . Td 01/25/2006  . Zoster 06/09/2011   Health Maintenance Due  Topic Date Due  . DEXA  SCAN  08/04/2002  . TETANUS/TDAP  01/26/2016  . URINE MICROALBUMIN  09/10/2016  . OPHTHALMOLOGY EXAM  11/10/2016    Patient Care Team: Dorothyann Peng, NP as PCP - General (Family Medicine)  Indicate any recent Medical Services you may have received from other than [facility] providers in the past year (date may be approximate).     Assessment:   This is a routine wellness examination for [name]. Physical assessment deferred to PCP.  Hearing/Vision screen No exam data present  Dietary issues and exercise activities discussed:    Diet (meal preparation, eat out, water intake, caffeinated beverages, dairy products, fruits and vegetables): {Desc; diets:16563} Breakfast: Lunch:  Dinner:      Goals    . Decrease the likelihood of falling    . Exercise  150 minutes per week (moderate activity)      Depression Screen PHQ 2/9 Scores 01/24/2016 04/03/2015  PHQ - 2 Score 1 5  PHQ- 9 Score - 15    Fall Risk Fall Risk  01/24/2016 05/08/2015 04/03/2015  Falls in the past year? Yes No No  Number falls in past yr: 2 or more - -  Injury with Fall? No - -  Risk Factor Category  High Fall Risk - -  Follow up Falls prevention discussed - -    Cognitive Function:        Screening Tests Health Maintenance  Topic Date Due  . DEXA SCAN  08/04/2002  . TETANUS/TDAP  01/26/2016  . URINE MICROALBUMIN  09/10/2016  . OPHTHALMOLOGY EXAM  11/10/2016  . INFLUENZA VACCINE  04/07/2017  . HEMOGLOBIN A1C  06/16/2017  . FOOT EXAM  12/15/2017  . PNA vac Low Risk Adult  Completed      Plan:   Follow-up w/ PCP as scheduled.  I have personally reviewed and noted the following in the patient's chart:   . Medical and social history . Use of alcohol, tobacco or illicit drugs  . Current medications and supplements . Functional ability and status . Nutritional status . Physical activity . Advanced directives . List of other physicians . Vitals . Screenings to include cognitive, depression, and  falls . Referrals and appointments  In addition, I have reviewed and discussed with patient certain preventive protocols, quality metrics, and best practice recommendations. A written personalized care plan for preventive services as well as general preventive health recommendations were provided to patient.     Dorrene German, RN   02/16/2017

## 2017-02-17 ENCOUNTER — Encounter: Payer: Self-pay | Admitting: Adult Health

## 2017-02-17 ENCOUNTER — Ambulatory Visit: Payer: Medicare Other

## 2017-02-17 ENCOUNTER — Ambulatory Visit (INDEPENDENT_AMBULATORY_CARE_PROVIDER_SITE_OTHER): Payer: Medicare Other | Admitting: Adult Health

## 2017-02-17 VITALS — BP 142/64 | Temp 98.5°F

## 2017-02-17 DIAGNOSIS — M109 Gout, unspecified: Secondary | ICD-10-CM

## 2017-02-17 DIAGNOSIS — E1122 Type 2 diabetes mellitus with diabetic chronic kidney disease: Secondary | ICD-10-CM | POA: Diagnosis not present

## 2017-02-17 DIAGNOSIS — F339 Major depressive disorder, recurrent, unspecified: Secondary | ICD-10-CM | POA: Diagnosis not present

## 2017-02-17 DIAGNOSIS — Z794 Long term (current) use of insulin: Secondary | ICD-10-CM | POA: Diagnosis not present

## 2017-02-17 DIAGNOSIS — E1165 Type 2 diabetes mellitus with hyperglycemia: Secondary | ICD-10-CM

## 2017-02-17 DIAGNOSIS — IMO0002 Reserved for concepts with insufficient information to code with codable children: Secondary | ICD-10-CM

## 2017-02-17 DIAGNOSIS — N183 Chronic kidney disease, stage 3 unspecified: Secondary | ICD-10-CM

## 2017-02-17 LAB — CBC WITH DIFFERENTIAL/PLATELET
Basophils Absolute: 0 10*3/uL (ref 0.0–0.1)
Basophils Relative: 0.3 % (ref 0.0–3.0)
Eosinophils Absolute: 0.2 10*3/uL (ref 0.0–0.7)
Eosinophils Relative: 3.5 % (ref 0.0–5.0)
HCT: 25 % — ABNORMAL LOW (ref 36.0–46.0)
Hemoglobin: 8.5 g/dL — ABNORMAL LOW (ref 12.0–15.0)
Lymphocytes Relative: 13.3 % (ref 12.0–46.0)
Lymphs Abs: 0.7 10*3/uL (ref 0.7–4.0)
MCHC: 33.8 g/dL (ref 30.0–36.0)
MCV: 92.9 fl (ref 78.0–100.0)
Monocytes Absolute: 0.4 10*3/uL (ref 0.1–1.0)
Monocytes Relative: 6.9 % (ref 3.0–12.0)
Neutro Abs: 4.2 10*3/uL (ref 1.4–7.7)
Neutrophils Relative %: 76 % (ref 43.0–77.0)
Platelets: 122 10*3/uL — ABNORMAL LOW (ref 150.0–400.0)
RBC: 2.7 Mil/uL — ABNORMAL LOW (ref 3.87–5.11)
RDW: 19.4 % — ABNORMAL HIGH (ref 11.5–15.5)
WBC: 5.5 10*3/uL (ref 4.0–10.5)

## 2017-02-17 LAB — BASIC METABOLIC PANEL
BUN: 36 mg/dL — ABNORMAL HIGH (ref 6–23)
CO2: 28 mEq/L (ref 19–32)
Calcium: 7 mg/dL — ABNORMAL LOW (ref 8.4–10.5)
Chloride: 101 mEq/L (ref 96–112)
Creatinine, Ser: 1.69 mg/dL — ABNORMAL HIGH (ref 0.40–1.20)
GFR: 30.98 mL/min — ABNORMAL LOW (ref >60.00)
Glucose, Bld: 279 mg/dL — ABNORMAL HIGH (ref 70–99)
Potassium: 3.6 mEq/L (ref 3.5–5.1)
Sodium: 140 mEq/L (ref 135–145)

## 2017-02-17 LAB — URIC ACID: Uric Acid, Serum: 12.4 mg/dL — ABNORMAL HIGH (ref 2.4–7.0)

## 2017-02-17 LAB — POCT GLYCOSYLATED HEMOGLOBIN (HGB A1C): Hemoglobin A1C: 7

## 2017-02-17 MED ORDER — COLCHICINE 0.6 MG PO TABS: ORAL_TABLET | ORAL | 0 refills | Status: DC

## 2017-02-17 NOTE — Progress Notes (Signed)
Subjective:    Patient ID: [name], female    DOB: Feb 21, 1935, 82 y.o.   MRN: [id number]  HPI  82 year old female who presents to the office today for acute gout pain. She was seen in the ER 02/11/2017 and was told that she has gout, was prescribed Oxycodone and sent home. Her ER note is not in the computer, but it shows that she was there.  She has not been taking the oxycodone because she does not want to be taking narcotics.   She continues to complain of pain in bilateral great toes and right medial heel. Reports that pain is worse with weight bearing. She endorses redness and warmth.   She is also complaining of 3 days of diarrhea. She believes that she picked up a GI bug when she was at the ER. Multiple other family members have had nausea, vomiting and diarrhea. She reports that this morning was the first morning where she did not have any diarrhea.   I received a staff message from the nurse at [place] office to inform me that Tona's blood sugars have been elevated. Upon reviewing recent labs her blood sugars have been in the high 200's. Her daughter who is with Thayer Headings at this appointment reports that Blossom is not active and is not taking her diabetes medication as she should be doing. Her daughter feels as though she is more depressed than normally. When speaking with Shawanna she does feel more depressed but this is due to not feeling well as of recent.Lashaunda reports that she is taking all of her medications as directed   Review of Systems  Constitutional: Negative.   HENT: Negative.   Eyes: Negative.   Respiratory: Negative.   Cardiovascular: Negative.   Gastrointestinal: Positive for diarrhea (resolved), nausea (resolved) and vomiting (resolved).  Endocrine: Negative.   Genitourinary: Negative.   Musculoskeletal: Positive for arthralgias, gait problem and joint swelling.  Skin: Positive for color change.  Allergic/Immunologic: Negative.   Hematological:  Negative.   Psychiatric/Behavioral: Negative.   All other systems reviewed and are negative.      Objective:   Physical Exam  Constitutional: She is oriented to person, place, and time. She appears well-developed and well-nourished.  Cardiovascular: Normal rate, regular rhythm, normal heart sounds and intact distal pulses.  Exam reveals no gallop and no friction rub.   No murmur heard. Pulmonary/Chest: Effort normal and breath sounds normal. No respiratory distress. She has no wheezes. She has no rales. She exhibits no tenderness.  Musculoskeletal: Normal range of motion. She exhibits edema and tenderness. She exhibits no deformity.  Neurological: She is alert and oriented to person, place, and time.  Skin: Skin is warm and dry. No rash noted. She is not diaphoretic. No erythema. No pallor.  Redness, swelling and warmth noted to bilateral great toes. Mild redness and warmth noted to medial right heel  Psychiatric: She has a normal mood and affect. Her behavior is normal. Judgment and thought content normal.  Nursing note and vitals reviewed.      Assessment & Plan:  1. Acute gout of ankle, unspecified cause, unspecified laterality - colchicine 0.6 MG tablet; Take one pill, two times daily until resolved  Dispense: 20 tablet; Refill: 0 - Uric Acid - CBC with Differential/Platelet  2. Uncontrolled type 2 diabetes mellitus with stage 3 chronic kidney disease, with long-term current use of insulin (HCC) - POC HgB A1c 7.0. This does not correspond to her elevated blood sugars. Elevation  may be do in part due to recent illness. I want her to monitor her glucose TID and titrate insulin as needed.  - Follow up in 3 months  - Basic Metabolic Panel - CBC with Differential/Platelet  3. Depression, recurrent ([place]) - I am going to have her increase Zoloft to 100mg  daily.  - sertraline (ZOLOFT) 100 MG tablet; ; Refill: 0  Dorothyann Peng, NP

## 2017-02-22 ENCOUNTER — Ambulatory Visit: Payer: Medicare Other | Admitting: Family

## 2017-02-22 ENCOUNTER — Other Ambulatory Visit: Payer: Medicare Other

## 2017-02-25 ENCOUNTER — Telehealth: Payer: Self-pay | Admitting: Cardiology

## 2017-02-25 ENCOUNTER — Other Ambulatory Visit (HOSPITAL_BASED_OUTPATIENT_CLINIC_OR_DEPARTMENT_OTHER): Payer: Medicare Other

## 2017-02-25 ENCOUNTER — Ambulatory Visit (HOSPITAL_BASED_OUTPATIENT_CLINIC_OR_DEPARTMENT_OTHER): Payer: Medicare Other | Admitting: Family

## 2017-02-25 VITALS — BP 140/45 | HR 78 | Temp 98.3°F | Resp 16 | Wt 238.0 lb

## 2017-02-25 DIAGNOSIS — D696 Thrombocytopenia, unspecified: Secondary | ICD-10-CM

## 2017-02-25 DIAGNOSIS — I4891 Unspecified atrial fibrillation: Secondary | ICD-10-CM

## 2017-02-25 DIAGNOSIS — Z86711 Personal history of pulmonary embolism: Secondary | ICD-10-CM | POA: Diagnosis not present

## 2017-02-25 DIAGNOSIS — K760 Fatty (change of) liver, not elsewhere classified: Secondary | ICD-10-CM

## 2017-02-25 LAB — CBC WITH DIFFERENTIAL (CANCER CENTER ONLY)
BASO#: 0 10*3/uL (ref 0.0–0.2)
BASO%: 0.2 % (ref 0.0–2.0)
EOS%: 3.6 % (ref 0.0–7.0)
Eosinophils Absolute: 0.2 10*3/uL (ref 0.0–0.5)
HCT: 27.7 % — ABNORMAL LOW (ref 34.8–46.6)
HGB: 8.8 g/dL — ABNORMAL LOW (ref 11.6–15.9)
LYMPH#: 1.1 10*3/uL (ref 0.9–3.3)
LYMPH%: 18.2 % (ref 14.0–48.0)
MCH: 30.4 pg (ref 26.0–34.0)
MCHC: 31.8 g/dL — ABNORMAL LOW (ref 32.0–36.0)
MCV: 96 fL (ref 81–101)
MONO#: 0.4 10*3/uL (ref 0.1–0.9)
MONO%: 7.6 % (ref 0.0–13.0)
NEUT#: 4.1 10*3/uL (ref 1.5–6.5)
NEUT%: 70.4 % (ref 39.6–80.0)
Platelets: 109 10*3/uL — ABNORMAL LOW (ref 145–400)
RBC: 2.89 10*6/uL — ABNORMAL LOW (ref 3.70–5.32)
RDW: 18 % — ABNORMAL HIGH (ref 11.1–15.7)
WBC: 5.8 10*3/uL (ref 3.9–10.0)

## 2017-02-25 LAB — COMPREHENSIVE METABOLIC PANEL
ALT: 13 U/L (ref 0–55)
AST: 16 U/L (ref 5–34)
Albumin: 3.7 g/dL (ref 3.5–5.0)
Alkaline Phosphatase: 65 U/L (ref 40–150)
Anion Gap: 15 mEq/L — ABNORMAL HIGH (ref 3–11)
BUN: 20.1 mg/dL (ref 7.0–26.0)
CO2: 26 mEq/L (ref 22–29)
Calcium: 8.7 mg/dL (ref 8.4–10.4)
Chloride: 102 mEq/L (ref 98–109)
Creatinine: 1.8 mg/dL — ABNORMAL HIGH (ref 0.6–1.1)
EGFR: 27 mL/min/{1.73_m2} — ABNORMAL LOW (ref >90)
Glucose: 184 mg/dl — ABNORMAL HIGH (ref 70–140)
Potassium: 3.8 mEq/L (ref 3.5–5.1)
Sodium: 142 mEq/L (ref 136–145)
Total Bilirubin: 0.76 mg/dL (ref 0.20–1.20)
Total Protein: 7.6 g/dL (ref 6.4–8.3)

## 2017-02-25 NOTE — Telephone Encounter (Signed)
Patient calling to r/s appt as Dr. Marlou Porch will not be in office. I did not see any openings until October. I offered an appt with APP, Patient refused and states that this is a very important appt..wanted to send message to you, thanks.

## 2017-02-25 NOTE — Progress Notes (Signed)
Hematology and Oncology Follow Up Visit  [name] [id number] 06/07/1935 82 y.o. 02/25/2017   Principle Diagnosis:  Thrombocytopenia History of PE  Current Therapy:   Eliquis 5 mg PO BID    Interim History:  [name] is here today for follow-up. She is getting over a stomach bug with n/v/d. She was on colchicine for gout in her right foot and developed worsened diarrhea. She stopped the colchicine and plans to follow-up with her PCP.  She states that with all the diarrhea her stools were green and black. Hgb has stayed stable at 8.8. She is doing well on Eliquis. She has nor had a cardioversion. She has a regular controlled rate and rhythm on auscultation at this time. She has seen no bright red or maroon blood in her stools. No bruising or petechiae. Platelet count is 109.  She also had her Zoloft increased to 100 mg PO daily last month. She states that she is feeling quite sluggish and also plans to discuss an adjustment in her dosage with her PCP.  No fever, chills, cough, rash, dizziness, SOB, chest pain, palpitations, abdominal pain or changes in bladder habits.  She has no appetite since being sick. She is staying hydrated drinking 3L of fluids a day. Her weight is down 10 lbs since her last visit.  No numbness or tingling in her extremities at this time.   ECOG Performance Status: 1 - Symptomatic but completely ambulatory  Medications:  Allergies as of 02/25/2017      Reactions   Metformin Diarrhea   Pineapple Swelling   Throat swells and blisters on tongue and roof of mouth per patient   Tetracycline Hives   Aspirin Itching, Rash   Contrast Media [iodinated Diagnostic Agents] Itching      Flagyl [metronidazole] Other (See Comments)   Unknown   Fluticasone-salmeterol Itching, Rash   Lactose Intolerance (gi) Other (See Comments)   gas   Latex Itching, Rash   Lisinopril Cough   Sulfonamide Derivatives Itching, Rash      Medication List       Accurate as of  02/25/17 10:18 AM. Always use your most recent med list.          apixaban 5 MG Tabs tablet Commonly known as:  ELIQUIS Take 1 tablet (5 mg total) by mouth 2 (two) times daily.   atorvastatin 80 MG tablet Commonly known as:  LIPITOR Take 1 tablet (80 mg total) by mouth daily.   BIOTIN PO Take 1 tablet by mouth daily.   Coenzyme Q10 200 MG capsule Take 200 mg by mouth daily.   colchicine 0.6 MG tablet Take one pill, two times daily until resolved   docusate sodium 100 MG capsule Commonly known as:  COLACE Take 1 capsule by mouth daily.   fexofenadine 180 MG tablet Commonly known as:  ALLEGRA Take 180 mg by mouth daily.   furosemide 20 MG tablet Commonly known as:  LASIX Take 2 tablets (40 mg total) by mouth daily.   glipiZIDE 5 MG tablet Commonly known as:  GLUCOTROL Take 1 tablet (5 mg total) by mouth 2 (two) times daily before a meal.   insulin NPH-regular Human (70-30) 100 UNIT/ML injection Commonly known as:  HUMULIN 70/30 INJECT 70 UNITS UNDER THE SKIN AS DIRECTED EVERY MORNING, AND 65 UNITS EVERY EVENING   Insulin Pen Needle 31G X 5 MM Misc Commonly known as:  B-D UF III MINI PEN NEEDLES Use daily for insulin injection   INSULIN SYRINGE  1CC/30GX5/16" 30G X 5/16" 1 ML Misc USE BID UTD   levothyroxine 112 MCG tablet Commonly known as:  SYNTHROID, LEVOTHROID Take 1 tablet (112 mcg total) by mouth daily.   multivitamin tablet Take 1 tablet by mouth daily.   MYRBETRIQ 50 MG Tb24 tablet Generic drug:  mirabegron ER Take 50 mg by mouth daily.   oxyCODONE-acetaminophen 5-325 MG tablet Commonly known as:  PERCOCET/ROXICET Take 1 tablet by mouth every 4 (four) hours as needed for severe pain.   potassium citrate 10 MEQ (1080 MG) SR tablet Commonly known as:  UROCIT-K Take 20 mEq by mouth 2 (two) times daily.   potassium citrate 10 MEQ (1080 MG) SR tablet Commonly known as:  UROCIT-K Take 1 tablet (10 mEq total) by mouth 3 (three) times daily with  meals.   PROBIOTIC PO Take 1 capsule by mouth daily.   ranitidine 150 MG tablet Commonly known as:  ZANTAC Take 150 mg by mouth daily as needed for heartburn.   sertraline 50 MG tablet Commonly known as:  ZOLOFT Take 1 tablet (50 mg total) by mouth daily.   sertraline 100 MG tablet Commonly known as:  ZOLOFT   VITAMIN B 12 PO Take 1 tablet by mouth daily.   vitamin C 250 MG tablet Commonly known as:  ASCORBIC ACID Take 250 mg by mouth daily.   Vitamin D3 2000 units Tabs Take 2,000 Units by mouth daily.       Allergies:  Allergies  Allergen Reactions  . Metformin Diarrhea  . Pineapple Swelling    Throat swells and blisters on tongue and roof of mouth per patient  . Tetracycline Hives  . Aspirin Itching and Rash  . Contrast Media [Iodinated Diagnostic Agents] Itching       . Flagyl [Metronidazole] Other (See Comments)    Unknown   . Fluticasone-Salmeterol Itching and Rash  . Lactose Intolerance (Gi) Other (See Comments)    gas  . Latex Itching and Rash  . Lisinopril Cough  . Sulfonamide Derivatives Itching and Rash    Past Medical History, Surgical history, Social history, and Family History were reviewed and updated.  Review of Systems: All other 10 point review of systems is negative.   Physical Exam:  vitals were not taken for this visit.  Wt Readings from Last 3 Encounters:  01/18/17 248 lb 6.4 oz (112.7 kg)  01/12/17 246 lb (111.6 kg)  12/15/16 242 lb (109.8 kg)    Ocular: Sclerae unicteric, pupils equal, round and reactive to light Ear-nose-throat: Oropharynx clear, dentition fair Lymphatic: No cervical, supraclavicular or axillary adenopathy Lungs no rales or rhonchi, good excursion bilaterally Heart controlled afib, no murmur appreciated Abd soft, nontender, positive bowel sounds, no liver or spleen tip palpated, no fluid wave MSK no focal spinal tenderness, no joint edema Neuro: non-focal, well-oriented, appropriate affect Breasts:  Deferred   Lab Results  Component Value Date   WBC 5.5 02/17/2017   HGB 8.5 Repeated and verified X2. (L) 02/17/2017   HCT 25.0 (L) 02/17/2017   MCV 92.9 02/17/2017   PLT 122.0 (L) 02/17/2017   Lab Results  Component Value Date   FERRITIN 93 01/28/2011   IRON 68 01/28/2011   TIBC 363 01/28/2011   UIBC 295 01/28/2011   IRONPCTSAT 19 (L) 01/28/2011   Lab Results  Component Value Date   RETICCTPCT 1.9 01/28/2011   RBC 2.70 (L) 02/17/2017   RETICCTABS 73.3 01/28/2011   No results found for: KPAFRELGTCHN, LAMBDASER, KAPLAMBRATIO No results found for: IGGSERUM,  IGA, IGMSERUM No results found for: Odetta Pink, SPEI   Chemistry      Component Value Date/Time   NA 140 02/17/2017 1055   NA 138 02/09/2017 1305   NA 140 01/18/2017 1105   K 3.6 02/17/2017 1055   K 4.4 02/09/2017 1305   K 4.6 01/18/2017 1105   CL 101 02/17/2017 1055   CL 101 02/09/2017 1305   CO2 28 02/17/2017 1055   CO2 27 02/09/2017 1305   CO2 25 01/18/2017 1105   BUN 36 (H) 02/17/2017 1055   BUN 32 (H) 02/09/2017 1305   BUN 29.2 (H) 01/18/2017 1105   CREATININE 1.69 (H) 02/17/2017 1055   CREATININE 1.72 (H) 02/09/2017 1305   CREATININE 1.7 (H) 01/18/2017 1105      Component Value Date/Time   CALCIUM 7.0 (L) 02/17/2017 1055   CALCIUM 8.5 (L) 02/09/2017 1305   CALCIUM 9.9 01/18/2017 1105   ALKPHOS 63 02/09/2017 1305   ALKPHOS 63 01/18/2017 1105   AST 13 02/09/2017 1305   AST 14 01/18/2017 1105   ALT 13 02/09/2017 1305   ALT 18 01/18/2017 1105   BILITOT 0.6 02/09/2017 1305   BILITOT 0.70 01/18/2017 1105      Impression and Plan: Ms. Guillen is a very pleasant 82 yo caucasian female with history of PE and thrombocytopenia. Her platelet count is stable at 109 and Hgb is 8.8. She is currently getting over a stomach virus and is still a little fatigued.  She is tolerating Eliquis nicely and will continue on her same regimen for now.  We will plan to  see her back in 2 months for repeat lab work and follow-up.  She promises to contact our office with any questions or concerns. We can certainly see her sooner if need be.   Eliezer Bottom, NP 6/21/201810:18 AM

## 2017-02-26 NOTE — Telephone Encounter (Signed)
Will forward to Dr. Marlou Porch to review- you are out of the office until mid July.

## 2017-02-26 NOTE — Telephone Encounter (Signed)
New message     Pt would like a call back to know if she needs the cardioversion ? Or is everything ok?  She would like squeezed in on Dr Marlou Porch next available day... No openings until October and refuses APP appointment.

## 2017-02-26 NOTE — ED Provider Notes (Signed)
[facility] DEPT Provider Note   CSN: [id number] Arrival date & time: 02/11/17  1143     History   Chief Complaint Chief Complaint  Patient presents with  . Leg Swelling    HPI [name] is a 82 y.o. female.  HPI Patient presents emergency department with increasing lower extremity swelling without significant shortness of breath.  She does have a history of congestive heart failure.  Denies numbness or tingling of her lower extremities.  No history DVT.  Her symptoms are mild to moderate in severity.  She does report some dietary indiscretion.  No other complaints at this time.  Symptoms are mild in severity   Past Medical History:  Diagnosis Date  . Allergic rhinitis   . Anxiety state, unspecified    panic attacks  . CHF (congestive heart failure) ([place])   . Depressive disorder, not elsewhere classified   . Extrinsic asthma, unspecified    no problem since adulthood  . Obesity   . OSA on CPAP    severe  . Pure hypercholesterolemia   . Respiratory failure with hypoxia ([place]) 09/2008   acute, secondary to multiple bilateral pulmonary embolism , negative hypercoagulable workup 09/2008 hospital stay  . Scoliosis   . Syncope and collapse   . Type II or unspecified type diabetes mellitus without mention of complication, not stated as uncontrolled   . Unspecified essential hypertension   . Unspecified hypothyroidism    hypo    Patient Active Problem List   Diagnosis Date Noted  . Thrombocytopenia ([place]) 01/25/2017  . Chronic kidney disease (CKD) stage G4/A1, severely decreased glomerular filtration rate (GFR) between 15-29 mL/min/1.73 square meter and albuminuria creatinine ratio less than 30 mg/g (HCC) 09/04/2015  . Chronic kidney disease (CKD), stage IV (severe) ([place]) 09/04/2015  . Hypoxia 08/24/2015  . Bronchitis 08/24/2015  . Acute respiratory failure with hypoxia ([place]) 08/24/2015  . Chronic kidney disease 08/24/2015  . Diabetes type 2, uncontrolled ([place])  08/05/2010  . GOUT 06/23/2010  . DERMATITIS 01/16/2010  . BACK PAIN 06/13/2009  . MYALGIA 06/13/2009  . Anxiety state 12/13/2008  . LEG EDEMA, CHRONIC 11/08/2008  . Sleep apnea 10/26/2008  . Hypothyroidism 09/27/2008  . HYPERCHOLESTEROLEMIA 09/27/2008  . OBESITY 09/27/2008  . DEPRESSION 09/27/2008  . Essential hypertension 09/27/2008  . ALLERGIC RHINITIS 09/27/2008  . ASTHMA, CHILDHOOD 09/27/2008    Past Surgical History:  Procedure Laterality Date  . EYE SURGERY    . JOINT REPLACEMENT    . knee replaced    . THYROID SURGERY      OB History    No data available       Home Medications    Prior to Admission medications   Medication Sig Start Date End Date Taking? Authorizing Provider  apixaban (ELIQUIS) 5 MG TABS tablet Take 1 tablet (5 mg total) by mouth 2 (two) times daily. 01/18/17  Yes [place], Holli Humbles, NP  atorvastatin (LIPITOR) 80 MG tablet Take 1 tablet (80 mg total) by mouth daily. 12/17/16  Yes Nafziger, Tommi Rumps, NP  BIOTIN PO Take 1 tablet by mouth daily.   Yes [provider]  Cholecalciferol (VITAMIN D3) 2000 units TABS Take 2,000 Units by mouth daily.   Yes [provider]  Coenzyme Q10 200 MG capsule Take 200 mg by mouth daily.     Yes [provider]  Cyanocobalamin (VITAMIN B 12 PO) Take 1 tablet by mouth daily.   Yes [provider]  docusate sodium (COLACE) 100 MG capsule Take  1 capsule by mouth daily. 04/30/15  Yes [provider]  fexofenadine (ALLEGRA) 180 MG tablet Take 180 mg by mouth daily.     Yes [provider]  furosemide (LASIX) 20 MG tablet Take 2 tablets (40 mg total) by mouth daily. 12/15/16  Yes Nafziger, Tommi Rumps, NP  glipiZIDE (GLUCOTROL) 5 MG tablet Take 1 tablet (5 mg total) by mouth 2 (two) times daily before a meal. 12/15/16 03/15/17 Yes Nafziger, Tommi Rumps, NP  insulin NPH-regular Human (HUMULIN 70/30) (70-30) 100 UNIT/ML injection INJECT 70 UNITS UNDER THE SKIN AS DIRECTED EVERY MORNING, AND 65  UNITS EVERY EVENING 12/15/16  Yes Nafziger, Tommi Rumps, NP  levothyroxine (SYNTHROID, LEVOTHROID) 112 MCG tablet Take 1 tablet (112 mcg total) by mouth daily. 12/15/16  Yes Nafziger, Tommi Rumps, NP  mirabegron ER (MYRBETRIQ) 50 MG TB24 tablet Take 50 mg by mouth daily. 11/30/16  Yes [provider]  Multiple Vitamin (MULTIVITAMIN) tablet Take 1 tablet by mouth daily.     Yes [provider]  potassium citrate (UROCIT-K) 10 MEQ (1080 MG) SR tablet Take 1 tablet (10 mEq total) by mouth 3 (three) times daily with meals. 12/15/16 03/15/17 Yes Nafziger, Tommi Rumps, NP  potassium citrate (UROCIT-K) 10 MEQ (1080 MG) SR tablet Take 20 mEq by mouth 2 (two) times daily.   Yes [provider]  Probiotic Product (PROBIOTIC PO) Take 1 capsule by mouth daily.   Yes [provider]  ranitidine (ZANTAC) 150 MG tablet Take 150 mg by mouth daily as needed for heartburn.   Yes [provider]  sertraline (ZOLOFT) 50 MG tablet Take 1 tablet (50 mg total) by mouth daily. 12/15/16  Yes Nafziger, Tommi Rumps, NP  vitamin C (ASCORBIC ACID) 250 MG tablet Take 250 mg by mouth daily.   Yes [provider]  colchicine 0.6 MG tablet Take one pill, two times daily until resolved 02/17/17   Nafziger, Tommi Rumps, NP  Insulin Pen Needle (B-D UF III MINI PEN NEEDLES) 31G X 5 MM MISC Use daily for insulin injection 12/15/16   Nafziger, Tommi Rumps, NP  Insulin Syringe-Needle U-100 (INSULIN SYRINGE 1CC/30GX5/16") 30G X 5/16" 1 ML MISC USE BID UTD 01/27/17   [provider]  oxyCODONE-acetaminophen (PERCOCET/ROXICET) 5-325 MG tablet Take 1 tablet by mouth every 4 (four) hours as needed for severe pain. 02/11/17   Jola Schmidt, MD  sertraline (ZOLOFT) 100 MG tablet  01/27/17   [provider]    Family History Family History  Problem Relation Age of Onset  . Allergies Mother   . Clotting disorder Mother   . Osteoarthritis Mother   . Asthma Mother   . Arthritis Other   . Diabetes Other   . Hyperlipidemia  Other   . Hypertension Other   . Coronary artery disease Other   . Stroke Other   . Osteoarthritis Daughter   . Rheum arthritis Maternal Grandmother   . Clotting disorder Maternal Grandmother   . Clotting disorder Maternal Uncle   . Clotting disorder Daughter   . Allergies Daughter     Social History Social History  Substance Use Topics  . Smoking status: Former Smoker    Years: 20.00    Quit date: 09/07/1989  . Smokeless tobacco: Never Used  . Alcohol use No     Allergies   Metformin; Pineapple; Tetracycline; Aspirin; Contrast media [iodinated diagnostic agents]; Flagyl [metronidazole]; Fluticasone-salmeterol; Lactose intolerance (gi); Latex; Lisinopril; and Sulfonamide derivatives   Review of Systems Review of Systems  All other systems reviewed and are negative.  Physical Exam Updated Vital Signs BP (!) 138/51   Pulse 62   Temp 98.3 F (36.8 C) (Oral)   Resp 17   SpO2 90%   Physical Exam  Constitutional: She is oriented to person, place, and time. She appears well-developed and well-nourished.  HENT:  Head: Normocephalic.  Eyes: EOM are normal.  Neck: Normal range of motion.  Cardiovascular: Normal rate.   Pulmonary/Chest: Effort normal and breath sounds normal.  Abdominal: She exhibits no distension.  Musculoskeletal:  1-2+ pitting edema bilaterally.  Normal PT and DP pulses bilaterally.  Neurological: She is alert and oriented to person, place, and time.  Psychiatric: She has a normal mood and affect.  Nursing note and vitals reviewed.    ED Treatments / Results  Labs (all labs ordered are listed, but only abnormal results are displayed) Labs Reviewed - No data to display  EKG  EKG Interpretation  Date/Time:  Thursday February 11 2017 14:45:41 EDT Ventricular Rate:  70 PR Interval:    QRS Duration: 109 QT Interval:  487 QTC Calculation: 526 R Axis:   64 Text Interpretation:  Sinus rhythm Low voltage, precordial leads Prolonged QT interval no  longer in atrial fib Confirmed by Jola Schmidt [phone]) on 02/11/2017 3:30:20 PM       Radiology No results found.  Procedures Procedures (including critical care time)  Medications Ordered in ED Medications  oxyCODONE-acetaminophen (PERCOCET/ROXICET) 5-325 MG per tablet 1 tablet (1 tablet Oral Given 02/11/17 1411)     Initial Impression / Assessment and Plan / ED Course  I have reviewed the triage vital signs and the nursing notes.  Pertinent labs & imaging results that were available during my care of the patient were reviewed by me and considered in my medical decision making (see chart for details).     Likely venous insufficiency.  Recommended compression stockings, salt restriction, healthy diet, elevation.  Primary care follow-up.  Final Clinical Impressions(s) / ED Diagnoses   Final diagnoses:  Acute right ankle pain  Swelling of lower extremity    New Prescriptions Discharge Medication List as of 02/11/2017  3:34 PM    START taking these medications   Details  oxyCODONE-acetaminophen (PERCOCET/ROXICET) 5-325 MG tablet Take 1 tablet by mouth every 4 (four) hours as needed for severe pain., Starting Thu 02/11/2017, Print         Jola Schmidt, MD 02/26/17 [phone]

## 2017-03-01 ENCOUNTER — Ambulatory Visit: Payer: Medicare Other | Admitting: Cardiology

## 2017-03-02 ENCOUNTER — Other Ambulatory Visit: Payer: Self-pay | Admitting: Adult Health

## 2017-03-03 ENCOUNTER — Ambulatory Visit (INDEPENDENT_AMBULATORY_CARE_PROVIDER_SITE_OTHER): Payer: Medicare Other | Admitting: Adult Health

## 2017-03-03 ENCOUNTER — Encounter: Payer: Self-pay | Admitting: Adult Health

## 2017-03-03 VITALS — BP 152/60 | Temp 97.6°F | Ht 61.5 in | Wt 240.1 lb

## 2017-03-03 DIAGNOSIS — F339 Major depressive disorder, recurrent, unspecified: Secondary | ICD-10-CM

## 2017-03-03 DIAGNOSIS — R2681 Unsteadiness on feet: Secondary | ICD-10-CM | POA: Diagnosis not present

## 2017-03-03 NOTE — Progress Notes (Signed)
Subjective:    Patient ID: [name], female    DOB: 14-Aug-1935, 82 y.o.   MRN: [id number]  HPI  82 year old female who  has a past medical history of Allergic rhinitis; Anxiety state, unspecified; CHF (congestive heart failure) ([place]); Depressive disorder, not elsewhere classified; Extrinsic asthma, unspecified; Obesity; OSA on CPAP; Pure hypercholesterolemia; Respiratory failure with hypoxia ([place]) (09/2008); Scoliosis; Syncope and collapse; Type II or unspecified type diabetes mellitus without mention of complication, not stated as uncontrolled; Unspecified essential hypertension; and Unspecified hypothyroidism.  She presents to the office for two week follow up regarding depression. During her last visit she was feeling more depressed than usual. Zoloft was increased to 100mg . She has been taking as directed but reports that it makes her feel " sluggish and fatigued". She would like to go back down to 50mg .   She would also like to see physical therapy for gait training as she feels unsteady with her rolling walker.    Review of Systems See HPI   Past Medical History:  Diagnosis Date  . Allergic rhinitis   . Anxiety state, unspecified    panic attacks  . CHF (congestive heart failure) ([place])   . Depressive disorder, not elsewhere classified   . Extrinsic asthma, unspecified    no problem since adulthood  . Obesity   . OSA on CPAP    severe  . Pure hypercholesterolemia   . Respiratory failure with hypoxia ([place]) 09/2008   acute, secondary to multiple bilateral pulmonary embolism , negative hypercoagulable workup 09/2008 hospital stay  . Scoliosis   . Syncope and collapse   . Type II or unspecified type diabetes mellitus without mention of complication, not stated as uncontrolled   . Unspecified essential hypertension   . Unspecified hypothyroidism    hypo    Social History   Social History  . Marital status: Widowed    Spouse name: N/A  . Number of children: 2  .  Years of education: N/A   Occupational History  . 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 Smoking status: Former Smoker    Years: 20.00    Quit date: 09/07/1989  . Smokeless tobacco: Never Used  . Alcohol use No  . Drug use: No  . Sexual activity: Not on file   Other Topics Concern  . Not on file   Social History Narrative  . No narrative on file    Past Surgical History:  Procedure Laterality Date  . EYE SURGERY    . JOINT REPLACEMENT    . knee replaced    . THYROID SURGERY      Family History  Problem Relation Age of Onset  . Allergies Mother   . Clotting disorder Mother   . Osteoarthritis Mother   . Asthma Mother   . Arthritis Other   . Diabetes Other   . Hyperlipidemia Other   . Hypertension Other   . Coronary artery disease Other   . Stroke Other   . Osteoarthritis Daughter   . Rheum arthritis Maternal Grandmother   . Clotting disorder Maternal Grandmother   . Clotting disorder Maternal Uncle   . Clotting disorder Daughter   . Allergies Daughter     Allergies  Allergen Reactions  . Metformin Diarrhea  . Pineapple Swelling    Throat swells and blisters on tongue and roof of mouth per patient  . Tetracycline Hives  . Aspirin  Itching and Rash  . Contrast Media [Iodinated Diagnostic Agents] Itching       . Flagyl [Metronidazole] Other (See Comments)    Unknown   . Fluticasone-Salmeterol Itching and Rash  . Lactose Intolerance (Gi) Other (See Comments)    gas  . Latex Itching and Rash  . Lisinopril Cough  . Sulfonamide Derivatives Itching and Rash    Current Outpatient Prescriptions on File Prior to Visit  Medication Sig Dispense Refill  . apixaban (ELIQUIS) 5 MG TABS tablet Take 1 tablet (5 mg total) by mouth 2 (two) times daily. 60 tablet 3  . atorvastatin (LIPITOR) 80 MG tablet Take 1 tablet (80 mg total) by mouth daily. 90 tablet 3  . BIOTIN PO Take 1 tablet by mouth daily.    .  Cholecalciferol (VITAMIN D3) 2000 units TABS Take 2,000 Units by mouth daily.    . Coenzyme Q10 200 MG capsule Take 200 mg by mouth daily.      . colchicine 0.6 MG tablet Take one pill, two times daily until resolved 20 tablet 0  . Cyanocobalamin (VITAMIN B 12 PO) Take 1 tablet by mouth daily.    Marland Kitchen docusate sodium (COLACE) 100 MG capsule Take 1 capsule by mouth daily.    . fexofenadine (ALLEGRA) 180 MG tablet Take 180 mg by mouth daily.      . furosemide (LASIX) 20 MG tablet Take 2 tablets (40 mg total) by mouth daily. 180 tablet 3  . glipiZIDE (GLUCOTROL) 5 MG tablet Take 1 tablet (5 mg total) by mouth 2 (two) times daily before a meal. 180 tablet 3  . insulin NPH-regular Human (HUMULIN 70/30) (70-30) 100 UNIT/ML injection INJECT 70 UNITS UNDER THE SKIN AS DIRECTED EVERY MORNING, AND 65 UNITS EVERY EVENING 30 mL 11  . Insulin Pen Needle (B-D UF III MINI PEN NEEDLES) 31G X 5 MM MISC Use daily for insulin injection 100 each 2  . Insulin Syringe-Needle U-100 (INSULIN SYRINGE 1CC/30GX5/16") 30G X 5/16" 1 ML MISC USE BID UTD  11  . levothyroxine (SYNTHROID, LEVOTHROID) 112 MCG tablet Take 1 tablet (112 mcg total) by mouth daily. 90 tablet 3  . mirabegron ER (MYRBETRIQ) 50 MG TB24 tablet Take 50 mg by mouth daily.    . Multiple Vitamin (MULTIVITAMIN) tablet Take 1 tablet by mouth daily.      Marland Kitchen oxyCODONE-acetaminophen (PERCOCET/ROXICET) 5-325 MG tablet Take 1 tablet by mouth every 4 (four) hours as needed for severe pain. 12 tablet 0  . potassium citrate (UROCIT-K) 10 MEQ (1080 MG) SR tablet Take 1 tablet (10 mEq total) by mouth 3 (three) times daily with meals. 270 tablet 3  . potassium citrate (UROCIT-K) 10 MEQ (1080 MG) SR tablet Take 20 mEq by mouth 2 (two) times daily.    . Probiotic Product (PROBIOTIC PO) Take 1 capsule by mouth daily.    . ranitidine (ZANTAC) 150 MG tablet Take 150 mg by mouth daily as needed for heartburn.    . sertraline (ZOLOFT) 50 MG tablet Take 1 tablet (50 mg total) by  mouth daily. 90 tablet 1  . vitamin C (ASCORBIC ACID) 250 MG tablet Take 250 mg by mouth daily.     No current facility-administered medications on file prior to visit.     BP (!) 152/60 (BP Location: Left Arm, Patient Position: Sitting, Cuff Size: Normal)   Temp 97.6 F (36.4 C) (Oral)   Ht 5' 1.5" (1.562 m)   Wt 240 lb 1.6 oz (108.9 kg)   BMI  44.63 kg/m       Objective:   Physical Exam  Constitutional: She is oriented to person, place, and time. She appears well-developed and well-nourished. No distress.  Cardiovascular: Normal rate, regular rhythm, normal heart sounds and intact distal pulses.  Exam reveals no gallop and no friction rub.   No murmur heard. Pulmonary/Chest: Effort normal and breath sounds normal. No respiratory distress. She has no wheezes. She has no rales. She exhibits no tenderness.  Neurological: She is alert and oriented to person, place, and time.  Skin: Skin is warm and dry. No rash noted. She is not diaphoretic. No erythema. No pallor.  Psychiatric: She has a normal mood and affect. Her behavior is normal. Judgment and thought content normal.  Nursing note and vitals reviewed.     Assessment & Plan:  1. Depression, recurrent (HCC) - Go back to 50 mg daily.  - I would like her to join silver sneakers so that she will start getting more exercise and will be able to meet people around her age. I think this would be good for depression symptoms   2. Gait instability  - Ambulatory referral to Physical Therapy   Dorothyann Peng, NP

## 2017-03-04 NOTE — Telephone Encounter (Signed)
Reviewed notes. Plan is to have cardioversion 3 weeks after starting Eliquis. Go ahead and schedule cardioversion.  [name], MD

## 2017-03-04 NOTE — Telephone Encounter (Signed)
Last couple of EKGs (in June) have shown NSR.  Her highest HR has been 77 bpm.  She has had medication changes within the last month and she believes it was possibly the cause of her At Fib.   She would like to know when to f/u with Dr Marlou Porch.  Aware I will forward this information to Dr Marlou Porch and call her back to schedule her.

## 2017-03-05 NOTE — Telephone Encounter (Signed)
Excellent news. No need for cardioversion if back in NSR. Will see her back in follow up.  [name], MD

## 2017-03-08 NOTE — Telephone Encounter (Signed)
Pt is aware that she is back in NSR, and she does not need to have cardioversion. A F?u appointment was made for pt on October 2 nd at 11:20 AM. Pt is aware to call the office if  needed prior appointment date.

## 2017-03-16 ENCOUNTER — Other Ambulatory Visit: Payer: Self-pay | Admitting: Adult Health

## 2017-03-16 DIAGNOSIS — Z760 Encounter for issue of repeat prescription: Secondary | ICD-10-CM

## 2017-03-16 NOTE — Progress Notes (Signed)
Pre visit review using our clinic review tool, if applicable. No additional management support is needed unless otherwise documented below in the visit note. 

## 2017-03-16 NOTE — Progress Notes (Signed)
Subjective:   [name] is a 82 y.o. female who presents for an Initial Medicare Annual Wellness Visit.  Pt currently on 2nd week of physical therapy and is very pleased with her progress.   Review of Systems    No ROS.  Medicare Wellness Visit. Additional risk factors are reflected in the social history.  Cardiac Risk Factors include: advanced age (>96men, >3 women);diabetes mellitus;hypertension   Sleep patterns:  Home Safety/Smoke Alarms: Feels safe in home. Smoke alarms in place. Walks with a walker or cane. Living environment; residence and Firearm Safety: Lives alone. Pt states she has assistance all day and at night.  Seat Belt Safety/Bike Helmet: Wears seat belt.   Counseling:   Dental- Every 6 months.   Female:   Pap-      Aged out. Mammo-   Order placed. Dexa scan-       No record. Order placed. CCS-  10/07/2007 Normal. Aged out.     Objective:    Today's Vitals   03/23/17 1710  BP: (!) 136/58  Pulse: 78  Resp: 16  SpO2: 98%  Weight: 240 lb 11.2 oz (109.2 kg)  Height: 5\' 2"  (1.575 m)   Body mass index is 44.02 kg/m.   Current Medications (verified) Outpatient Encounter Prescriptions as of 03/23/2017  Medication Sig  . apixaban (ELIQUIS) 5 MG TABS tablet Take 1 tablet (5 mg total) by mouth 2 (two) times daily.  Marland Kitchen atorvastatin (LIPITOR) 80 MG tablet Take 1 tablet (80 mg total) by mouth daily.  Marland Kitchen BIOTIN PO Take 1 tablet by mouth daily.  . Cholecalciferol (VITAMIN D3) 2000 units TABS Take 2,000 Units by mouth daily.  . Coenzyme Q10 200 MG capsule Take 200 mg by mouth daily.    . colchicine 0.6 MG tablet Take one pill, two times daily until resolved  . Cyanocobalamin (VITAMIN B 12 PO) Take 1 tablet by mouth daily.  Marland Kitchen docusate sodium (COLACE) 100 MG capsule Take 1 capsule by mouth daily.  . fexofenadine (ALLEGRA) 180 MG tablet Take 180 mg by mouth daily.    . furosemide (LASIX) 20 MG tablet TAKE 2 TABLETS BY MOUTH DAILY  . glipiZIDE (GLUCOTROL) 5 MG  tablet Take 1 tablet (5 mg total) by mouth 2 (two) times daily before a meal.  . insulin NPH-regular Human (HUMULIN 70/30) (70-30) 100 UNIT/ML injection INJECT 70 UNITS UNDER THE SKIN AS DIRECTED EVERY MORNING, AND 65 UNITS EVERY EVENING  . Insulin Pen Needle (B-D UF III MINI PEN NEEDLES) 31G X 5 MM MISC Use daily for insulin injection  . Insulin Syringe-Needle U-100 (INSULIN SYRINGE 1CC/30GX5/16") 30G X 5/16" 1 ML MISC USE BID UTD  . levothyroxine (SYNTHROID, LEVOTHROID) 112 MCG tablet Take 1 tablet (112 mcg total) by mouth daily.  . mirabegron ER (MYRBETRIQ) 50 MG TB24 tablet Take 50 mg by mouth daily.  . Multiple Vitamin (MULTIVITAMIN) tablet Take 1 tablet by mouth daily.    Marland Kitchen oxyCODONE-acetaminophen (PERCOCET/ROXICET) 5-325 MG tablet Take 1 tablet by mouth every 4 (four) hours as needed for severe pain.  . potassium citrate (UROCIT-K) 10 MEQ (1080 MG) SR tablet Take 20 mEq by mouth 2 (two) times daily.  . Probiotic Product (PROBIOTIC PO) Take 1 capsule by mouth daily.  . ranitidine (ZANTAC) 150 MG tablet Take 150 mg by mouth daily as needed for heartburn.  . sertraline (ZOLOFT) 50 MG tablet Take 1 tablet (50 mg total) by mouth daily.  . vitamin C (ASCORBIC ACID) 250 MG tablet Take  250 mg by mouth daily.  . [DISCONTINUED] furosemide (LASIX) 20 MG tablet Take 2 tablets (40 mg total) by mouth daily.   No facility-administered encounter medications on file as of 03/23/2017.     Allergies (verified) Metformin; Pineapple; Tetracycline; Aspirin; Contrast media [iodinated diagnostic agents]; Flagyl [metronidazole]; Fluticasone-salmeterol; Lactose intolerance (gi); Latex; Lisinopril; and Sulfonamide derivatives   History: Past Medical History:  Diagnosis Date  . Allergic rhinitis   . Anxiety state, unspecified    panic attacks  . CHF (congestive heart failure) ([place])   . Depressive disorder, not elsewhere classified   . Extrinsic asthma, unspecified    no problem since adulthood  . Obesity     . OSA on CPAP    severe  . Pure hypercholesterolemia   . Respiratory failure with hypoxia ([place]) 09/2008   acute, secondary to multiple bilateral pulmonary embolism , negative hypercoagulable workup 09/2008 hospital stay  . Scoliosis   . Syncope and collapse   . Type II or unspecified type diabetes mellitus without mention of complication, not stated as uncontrolled   . Unspecified essential hypertension   . Unspecified hypothyroidism    hypo   Past Surgical History:  Procedure Laterality Date  . EYE SURGERY    . JOINT REPLACEMENT    . knee replaced    . THYROID SURGERY     Family History  Problem Relation Age of Onset  . Allergies Mother   . Clotting disorder Mother   . Osteoarthritis Mother   . Asthma Mother   . Arthritis Other   . Diabetes Other   . Hyperlipidemia Other   . Hypertension Other   . Coronary artery disease Other   . Stroke Other   . Osteoarthritis Daughter   . Rheum arthritis Maternal Grandmother   . Clotting disorder Maternal Grandmother   . Clotting disorder Maternal Uncle   . Clotting disorder Daughter   . Allergies Daughter    Social History   Occupational History  . 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 Smoking status: Former Smoker    Years: 20.00    Quit date: 09/07/1989  . Smokeless tobacco: Never Used  . Alcohol use No  . Drug use: No  . Sexual activity: Not on file    Tobacco Counseling Counseling given: Not Answered   Activities of Daily Living In your present state of health, do you have any difficulty performing the following activities: 03/23/2017  Hearing? N  Vision? N  Difficulty concentrating or making decisions? N  Walking or climbing stairs? Y  Dressing or bathing? N  Doing errands, shopping? N  Preparing Food and eating ? N  Using the Toilet? N  In the past six months, have you accidently leaked urine? Y  Do you have problems with loss of bowel control? N  Managing  your Medications? N  Managing your Finances? N  Housekeeping or managing your Housekeeping? N  Some recent data might be hidden    Immunizations and Health Maintenance Immunization History  Administered Date(s) Administered  . Influenza Split 06/09/2011  . Influenza Whole 07/23/2008, 06/13/2009, 06/07/2012  . Influenza, High Dose Seasonal PF 05/08/2015  . Pneumococcal Conjugate-13 12/15/2016  . Pneumococcal Polysaccharide-23 01/30/2008  . Pneumococcal-Unspecified 09/07/2013  . Td 01/25/2006  . Zoster 06/09/2011   Health Maintenance Due  Topic Date Due  . DEXA SCAN  08/04/2002  . TETANUS/TDAP  01/26/2016  . URINE MICROALBUMIN  09/10/2016  .  OPHTHALMOLOGY EXAM  11/10/2016    Patient Care Team: Dorothyann Peng, NP as PCP - General (Family Medicine)  Indicate any recent Medical Services you may have received from other than [facility] providers in the past year (date may be approximate).     Assessment:   This is a routine wellness examination for Drue. Physical assessment deferred to PCP.   Hearing/Vision screen Hearing Screening Comments: Went to [facility], no hearing aids suggested.  Dietary issues and exercise activities discussed: Current Exercise Habits: Home exercise routine, Type of exercise: Other - see comments (Physical Therapy), Time (Minutes): 30, Frequency (Times/Week): 7, Weekly Exercise (Minutes/Week): 210, Intensity: Mild   Diet (meal preparation, eat out, water intake, caffeinated beverages, dairy products, fruits and vegetables): 2-3 meals/day. Pt states she drinks 10-12 glasses water/day.  Breakfast: Eggs and bacon or grits and protein drink. Lunch: Salad or 1/2 sandwich with chips. Dinner: Meat and vegetable with starch.    I discussed avoiding processed foods and trying to eat 4-6 small meals per day as opposed to 2-3 large meals.   Goals    . Decrease the likelihood of falling    . Exercise 150 minutes per week (moderate activity)      Depression  Screen PHQ 2/9 Scores 03/23/2017 01/24/2016 04/03/2015  PHQ - 2 Score 1 1 5   PHQ- 9 Score - - 15    Fall Risk Fall Risk  03/23/2017 01/24/2016 05/08/2015 04/03/2015  Falls in the past year? Yes Yes No No  Number falls in past yr: 1 2 or more - -  Injury with Fall? No No - -  Risk Factor Category  - High Fall Risk - -  Risk for fall due to : Impaired balance/gait;Impaired mobility - - -  Follow up Falls prevention discussed;Education provided Falls prevention discussed - -    Cognitive Function:   Ad8 score reviewed for issues:  Issues making decisions:no  Less interest in hobbies / activities:no  Repeats questions, stories (family complaining):no  Trouble using ordinary gadgets (microwave, computer, phone):no  Forgets the month or year: no  Mismanaging finances: no  Remembering appts:no  Daily problems with thinking and/or memory:no Ad8 score is=0      Screening Tests Health Maintenance  Topic Date Due  . DEXA SCAN  08/04/2002  . TETANUS/TDAP  01/26/2016  . URINE MICROALBUMIN  09/10/2016  . OPHTHALMOLOGY EXAM  11/10/2016  . INFLUENZA VACCINE  04/07/2017  . HEMOGLOBIN A1C  08/19/2017  . FOOT EXAM  12/15/2017  . PNA vac Low Risk Adult  Completed      Plan:    Bring a copy of your advance directives to your next office visit.  Order placed for Dexa and Mammogram today.  Order placed for diabetic nutrition class today.  Pt instructed to continue to get her hearing checked per insurance allowance.   Pt instructed to go to [facility] for socialization.  Pt instructed to continue physical therapy per their recommendation.  I have personally reviewed and noted the following in the patient's chart:   . Medical and social history . Use of alcohol, tobacco or illicit drugs  . Current medications and supplements . Functional ability and status . Nutritional status . Physical activity . Advanced directives . List of other physicians . Vitals . Screenings to  include cognitive, depression, and falls . Referrals and appointments  In addition, I have reviewed and discussed with patient certain preventive protocols, quality metrics, and best practice recommendations. A written personalized care plan for  preventive services as well as general preventive health recommendations were provided to patient.     Ree Edman, RN   03/23/2017

## 2017-03-17 ENCOUNTER — Encounter: Payer: Self-pay | Admitting: Rehabilitative and Restorative Service Providers"

## 2017-03-17 ENCOUNTER — Ambulatory Visit: Payer: Medicare Other | Attending: Adult Health | Admitting: Rehabilitative and Restorative Service Providers"

## 2017-03-17 DIAGNOSIS — R2681 Unsteadiness on feet: Secondary | ICD-10-CM | POA: Insufficient documentation

## 2017-03-17 DIAGNOSIS — Z7409 Other reduced mobility: Secondary | ICD-10-CM | POA: Insufficient documentation

## 2017-03-17 DIAGNOSIS — R269 Unspecified abnormalities of gait and mobility: Secondary | ICD-10-CM | POA: Diagnosis not present

## 2017-03-17 DIAGNOSIS — R531 Weakness: Secondary | ICD-10-CM | POA: Diagnosis not present

## 2017-03-17 DIAGNOSIS — R6889 Other general symptoms and signs: Secondary | ICD-10-CM

## 2017-03-17 NOTE — Therapy (Signed)
[facility] [address], [place], [place], [address] Phone: [phone]   Fax:  [phone]  Physical Therapy Evaluation  Patient Details  Name: [name] MRN: [id number] Date of Birth: 06-22-1935 Referring Provider: Dorothyann Peng  Encounter Date: 03/17/2017      PT End of Session - 03/17/17 1326    Visit Number 1   Number of Visits 16   Date for PT Re-Evaluation 05/12/17   PT Start Time 4268   PT Stop Time 1201   PT Time Calculation (min) 56 min   Activity Tolerance Patient tolerated treatment well;No increased pain   Behavior During Therapy WFL for tasks assessed/performed      Past Medical History:  Diagnosis Date  . Allergic rhinitis   . Anxiety state, unspecified    panic attacks  . CHF (congestive heart failure) ([facility] [place])   . Depressive disorder, not elsewhere classified   . Extrinsic asthma, unspecified    no problem since adulthood  . Obesity   . OSA on CPAP    severe  . Pure hypercholesterolemia   . Respiratory failure with hypoxia ([place]) 09/2008   acute, secondary to multiple bilateral pulmonary embolism , negative hypercoagulable workup 09/2008 hospital stay  . Scoliosis   . Syncope and collapse   . Type II or unspecified type diabetes mellitus without mention of complication, not stated as uncontrolled   . Unspecified essential hypertension   . Unspecified hypothyroidism    hypo    Past Surgical History:  Procedure Laterality Date  . EYE SURGERY    . JOINT REPLACEMENT    . knee replaced    . THYROID SURGERY      There were no vitals filed for this visit.       Subjective Assessment - 03/17/17 1122    Subjective I am here to get stronger. I had gout and was down and out for a month and now I can't walk or stand long. i was walking with a cane before.    Pertinent History L TKR 2013; Pt reports gout 1 month ago and then bout of stomach virus which made pt only weaker. Has a  caregiver who is present for almost 24 hours due to pt fear of being alone since her spouse died and that she may get sick in the middle of the night. Had HHPT 1 year ago and was able to amb with cane up until gout flareup 1 month ago.    Limitations Standing;Walking   How long can you sit comfortably? no problem   How long can you stand comfortably? 1-2 minutes   How long can you walk comfortably? < 30 sec   Diagnostic tests n/a   Patient Stated Goals to get back on my cane; to get stronger   Currently in Pain? Other (Comment)  pt reports occasional R toe pain    Pain Score 0-No pain            [facility] PT Assessment - 03/17/17 0001      Assessment   Medical Diagnosis gait instability   Referring Provider Nafziger, Tommi Rumps   Onset Date/Surgical Date 02/05/17   Hand Dominance Right   Next MD Visit pt unsure   Prior Therapy HHPT x 1 year ago     Precautions   Precautions None   Precaution Comments lightheadedness with change in position; decreased endurance, SOB     Restrictions   Weight Bearing Restrictions No  Balance Screen   Has the patient fallen in the past 6 months No     [place] residence   Additional Comments has caregivers present almost 24 hours; has pool in home     Prior Function   Level of Independence Requires assistive device for independence;Needs assistance with ADLs;Needs assistance with transfers     Cognition   Overall Cognitive Status Within Functional Limits for tasks assessed     Observation/Other Assessments   Observations obesity, decreased activity tolerance even supine     Coordination   Gross Motor Movements are Fluid and Coordinated Yes   Fine Motor Movements are Fluid and Coordinated Yes     Squat   Comments difficult and must have sturdy surface     Step Up   Comments needs CGA for safety     Step Down   Comments needs CGA for safety     Sit to Stand   Comments SBA but needs CGA for safety  at home     Posture/Postural Control   Posture Comments obese, pt is aware of curving posture and is noted to try to self-correct without PT A in standing     ROM / Strength   AROM / PROM / Strength AROM;Strength     AROM   Overall AROM Comments bil knee/hip AROM WFL     Strength   Overall Strength Comments R LE strength 4/5; L 4+/5; however functional weakness noted; lumbar/core strength poor     Bed Mobility   Bed Mobility --  min A; pt sleeps in recliner     Transfers   Five time sit to stand comments  pt need rest break after 3 STS     Ambulation/Gait   Ambulation/Gait Yes   Ambulation/Gait Assistance 5: Supervision   Ambulation Distance (Feet) 31 Feet   Assistive device 4-wheeled walker   Gait Pattern --  L foot ER   Gait velocity slow   Gait Comments pt amb 29 sec prior to rest break     Balance   Balance Assessed Yes     Standardized Balance Assessment   Standardized Balance Assessment Berg Balance Test  21/56     Berg Balance Test   [place] comment: 21/56            Objective measurements completed on examination: See above findings.                  PT Education - 03/17/17 1220    Education provided Yes   Education Details HEP: quad sets, heel slides, LAQ, seated hip abdct with knee flexion and knees extended 1x/day x 10 reps each;discused with pt using pool in home and performing water walking forward, backward and sideways with caregiver.    Person(s) Educated Patient   Methods Explanation;Demonstration   Comprehension Verbalized understanding;Returned demonstration          PT Short Term Goals - 03/17/17 1227      PT SHORT TERM GOAL #1   Title be independent in initial HEP   Baseline issued at eval   Time 4   Period Weeks   Status New     PT SHORT TERM GOAL #2   Title walk for 4-5 min without fatigue to assist with community ambulation with rollator   Baseline 29 sec    Time 4   Period Weeks   Status New     PT SHORT  TERM GOAL #3  Title stand x 6 min to assist with improved safe mobility   Baseline 32 sec   Time 4   Period Weeks   Status New     PT SHORT TERM GOAL #4   Title Perform 10 sit to stands without rest break to assist with safe transfers at home   Baseline 3   Time 4   Period Weeks   Status New           PT Long Term Goals - 03/17/17 1229      PT LONG TERM GOAL #1   Title be independent in advanced HEP   Baseline not issued   Time 8   Period Weeks   Status New     PT LONG TERM GOAL #2   Title Improve Berg to >/= 40/56 for decreased falls risk   Baseline 21/56   Time 8   Period Weeks   Status New     PT LONG TERM GOAL #3   Title Pt will be able to perform 8/12 on tinetti   Baseline 3/12   Time 8   Period Weeks   Status New     PT LONG TERM GOAL #4   Title Demo improved bil LE strength to 5/5 to assist with transfers across all venues   Baseline R 4+ and L 4+/5   Time 8   Period Weeks   Status New     PT LONG TERM GOAL #5   Title Improve lumbar/core strength to good - to assist with longer duration ambulation in community   Baseline poor   Time 8   Period Weeks   Status New     Additional Long Term Goals   Additional Long Term Goals Yes     PT LONG TERM GOAL #6   Title Pt will be able to amb x 8-10 min with rollator MI without rest break to assist with community ambulation   Baseline 29 sec   Time 8   Period Weeks   Status New                Plan - 03/17/17 1221    Clinical Impression Statement Pt presents to PT with poor endurance, decreased balance, decreased activity tolerance, poor lumbar/core/LE strengthening. Pt would benefit from PT for lumbar/core/LE strengthening, balance training, activity tolerance activities, and GT to increase function and decrease falls risk. Pt can amb x 29 sec only prior to rest break request with rollator.    History and Personal Factors relevant to plan of care: obesity, CHF   Clinical Presentation Evolving    Clinical Presentation due to: pt recent bout of gout, decreased activity tolerance and weakness as a result, syncope   Clinical Decision Making Low   Rehab Potential Good   Clinical Impairments Affecting Rehab Potential watch for syncope   PT Frequency 2x / week   PT Duration 8 weeks   PT Treatment/Interventions Functional mobility training;Stair training;Gait training;DME Instruction;Therapeutic activities;Therapeutic exercise;Balance training;Neuromuscular re-education;Patient/family education;Energy conservation   PT Next Visit Plan review HEP; perform supine therex, do 6 MWT   PT Home Exercise Plan see pt education section   Consulted and Agree with Plan of Care Patient      Patient will benefit from skilled therapeutic intervention in order to improve the following deficits and impairments:  Abnormal gait, Decreased activity tolerance, Decreased balance, Decreased mobility, Decreased endurance, Decreased strength, Difficulty walking, Obesity  Visit Diagnosis: Abnormality of gait - Plan: PT plan of care cert/re-cert  Decreased strength, endurance, and mobility - Plan: PT plan of care cert/re-cert  Unsteadiness on feet - Plan: PT plan of care cert/re-cert      G-Codes - 37/10/62 1327    Functional Assessment Tool Used (Outpatient Only) Merrilee Jansky 21/56   Functional Limitation Mobility: Walking and moving around   Mobility: Walking and Moving Around Current Status (I9485) At least 60 percent but less than 80 percent impaired, limited or restricted   Mobility: Walking and Moving Around Goal Status (I6270) At least 40 percent but less than 60 percent impaired, limited or restricted       Problem List Patient Active Problem List   Diagnosis Date Noted  . Thrombocytopenia ([place]) 01/25/2017  . Chronic kidney disease (CKD) stage G4/A1, severely decreased glomerular filtration rate (GFR) between 15-29 mL/min/1.73 square meter and albuminuria creatinine ratio less than 30 mg/g ([facility])  09/04/2015  . Chronic kidney disease (CKD), stage IV (severe) ([place]) 09/04/2015  . Hypoxia 08/24/2015  . Bronchitis 08/24/2015  . Acute respiratory failure with hypoxia ([place]) 08/24/2015  . Chronic kidney disease 08/24/2015  . Diabetes type 2, uncontrolled ([place]) 08/05/2010  . GOUT 06/23/2010  . DERMATITIS 01/16/2010  . BACK PAIN 06/13/2009  . MYALGIA 06/13/2009  . Anxiety state 12/13/2008  . LEG EDEMA, CHRONIC 11/08/2008  . Sleep apnea 10/26/2008  . Hypothyroidism 09/27/2008  . HYPERCHOLESTEROLEMIA 09/27/2008  . OBESITY 09/27/2008  . DEPRESSION 09/27/2008  . Essential hypertension 09/27/2008  . ALLERGIC RHINITIS 09/27/2008  . ASTHMA, CHILDHOOD 09/27/2008    Myra Rude, PT 03/17/2017, 1:30 PM  [facility] [address], [place], [place], [address] Phone: [phone]   Fax:  [phone]  Name: [name] MRN: [id number] Date of Birth: 10-03-1934

## 2017-03-19 ENCOUNTER — Encounter: Payer: Self-pay | Admitting: Physical Therapy

## 2017-03-19 ENCOUNTER — Ambulatory Visit: Payer: Medicare Other | Admitting: Physical Therapy

## 2017-03-19 DIAGNOSIS — Z7409 Other reduced mobility: Secondary | ICD-10-CM

## 2017-03-19 DIAGNOSIS — R269 Unspecified abnormalities of gait and mobility: Secondary | ICD-10-CM | POA: Diagnosis not present

## 2017-03-19 DIAGNOSIS — R531 Weakness: Secondary | ICD-10-CM

## 2017-03-19 DIAGNOSIS — R2681 Unsteadiness on feet: Secondary | ICD-10-CM | POA: Diagnosis not present

## 2017-03-19 DIAGNOSIS — R6889 Other general symptoms and signs: Secondary | ICD-10-CM

## 2017-03-19 NOTE — Therapy (Signed)
[facility] [address] [place], [place], [address] Phone: [phone]   Fax:  [phone]  Physical Therapy Treatment  Patient Details  Name: [name] MRN: [id number] Date of Birth: July 29, 1935 Referring Provider: Dorothyann Peng  Encounter Date: 03/19/2017      PT End of Session - 03/19/17 1022    Visit Number 2   Number of Visits 16   Date for PT Re-Evaluation 05/12/17   PT Start Time 1022   PT Stop Time 1100   PT Time Calculation (min) 38 min   Activity Tolerance Patient tolerated treatment well;No increased pain   Behavior During Therapy WFL for tasks assessed/performed      Past Medical History:  Diagnosis Date  . Allergic rhinitis   . Anxiety state, unspecified    panic attacks  . CHF (congestive heart failure) ([place])   . Depressive disorder, not elsewhere classified   . Extrinsic asthma, unspecified    no problem since adulthood  . Obesity   . OSA on CPAP    severe  . Pure hypercholesterolemia   . Respiratory failure with hypoxia ([place]) 09/2008   acute, secondary to multiple bilateral pulmonary embolism , negative hypercoagulable workup 09/2008 hospital stay  . Scoliosis   . Syncope and collapse   . Type II or unspecified type diabetes mellitus without mention of complication, not stated as uncontrolled   . Unspecified essential hypertension   . Unspecified hypothyroidism    hypo    Past Surgical History:  Procedure Laterality Date  . EYE SURGERY    . JOINT REPLACEMENT    . knee replaced    . THYROID SURGERY      There were no vitals filed for this visit.      Subjective Assessment - 03/19/17 1059    Subjective I did the exercises.  It was challenging and I could feel it.  I like to come here and exercise.   Pertinent History L TKR 2013; Pt reports gout 1 month ago and then bout of stomach virus which made pt only weaker. Has a caregiver who is present for almost 24 hours due to  pt fear of being alone since her spouse died and that she may get sick in the middle of the night. Had HHPT 1 year ago and was able to amb with cane up until gout flareup 1 month ago.    Limitations Standing;Walking   Patient Stated Goals to get back on my cane; to get stronger   Currently in Pain? No/denies            [facility] PT Assessment - 03/19/17 0001      6 minute walk test results    Aerobic Endurance Distance Walked 315  feet   Endurance additional comments needed standing break at 1 min, sitting break at 3 min (2 min seated break)                     [facility] Adult PT Treatment/Exercise - 03/19/17 0001      Therapeutic Activites    Therapeutic Activities ADL's   ADL's walking - 6 min walk test     Exercises   Exercises Knee/Hip     Knee/Hip Exercises: Aerobic   Nustep L1 3 min  seat 6     Knee/Hip Exercises: Seated   Long Arc Quad Strengthening;Right;Left;10 reps  1.5#   Marching Strengthening;Both;10 reps  1.5#     Knee/Hip Exercises: Supine  Heel Slides Strengthening;Both;10 reps   Hip Adduction Isometric Strengthening;Both;10 reps  5 sec hold   Bridges Strengthening;Both;10 reps  mini bridge                  PT Short Term Goals - 03/17/17 1227      PT SHORT TERM GOAL #1   Title be independent in initial HEP   Baseline issued at eval   Time 4   Period Weeks   Status New     PT SHORT TERM GOAL #2   Title walk for 4-5 min without fatigue to assist with community ambulation with rollator   Baseline 29 sec    Time 4   Period Weeks   Status New     PT SHORT TERM GOAL #3   Title stand x 6 min to assist with improved safe mobility   Baseline 32 sec   Time 4   Period Weeks   Status New     PT SHORT TERM GOAL #4   Title Perform 10 sit to stands without rest break to assist with safe transfers at home   Baseline 3   Time 4   Period Weeks   Status New           PT Long Term Goals - 03/17/17 1229      PT LONG TERM  GOAL #1   Title be independent in advanced HEP   Baseline not issued   Time 8   Period Weeks   Status New     PT LONG TERM GOAL #2   Title Improve Berg to >/= 40/56 for decreased falls risk   Baseline 21/56   Time 8   Period Weeks   Status New     PT LONG TERM GOAL #3   Title Pt will be able to perform 8/12 on tinetti   Baseline 3/12   Time 8   Period Weeks   Status New     PT LONG TERM GOAL #4   Title Demo improved bil LE strength to 5/5 to assist with transfers across all venues   Baseline R 4+ and L 4+/5   Time 8   Period Weeks   Status New     PT LONG TERM GOAL #5   Title Improve lumbar/core strength to good - to assist with longer duration ambulation in community   Baseline poor   Time 8   Period Weeks   Status New     Additional Long Term Goals   Additional Long Term Goals Yes     PT LONG TERM GOAL #6   Title Pt will be able to amb x 8-10 min with rollator MI without rest break to assist with community ambulation   Baseline 29 sec   Time 8   Period Weeks   Status New               Plan - 03/19/17 1022    Clinical Impression Statement No goals met today due to first treatment since eval.  Patient was able to walk 1 min without stopping today.  She has low endurance and needs frequent breaks to catch her breath.  PT monitored throughout.  She was able to progress strength and endurance exercises today.   Clinical Impairments Affecting Rehab Potential watch for syncope   PT Treatment/Interventions Functional mobility training;Stair training;Gait training;DME Instruction;Therapeutic activities;Therapeutic exercise;Balance training;Neuromuscular re-education;Patient/family education;Energy conservation   PT Next Visit Plan LE strength, posture, standing and walking endurance, nu-step  Consulted and Agree with Plan of Care Patient      Patient will benefit from skilled therapeutic intervention in order to improve the following deficits and impairments:   Abnormal gait, Decreased activity tolerance, Decreased balance, Decreased mobility, Decreased endurance, Decreased strength, Difficulty walking, Obesity  Visit Diagnosis: Abnormality of gait  Decreased strength, endurance, and mobility  Unsteadiness on feet     Problem List Patient Active Problem List   Diagnosis Date Noted  . Thrombocytopenia ([place]) 01/25/2017  . Chronic kidney disease (CKD) stage G4/A1, severely decreased glomerular filtration rate (GFR) between 15-29 mL/min/1.73 square meter and albuminuria creatinine ratio less than 30 mg/g ([facility]) 09/04/2015  . Chronic kidney disease (CKD), stage IV (severe) ([place]) 09/04/2015  . Hypoxia 08/24/2015  . Bronchitis 08/24/2015  . Acute respiratory failure with hypoxia ([place]) 08/24/2015  . Chronic kidney disease 08/24/2015  . Diabetes type 2, uncontrolled ([place]) 08/05/2010  . GOUT 06/23/2010  . DERMATITIS 01/16/2010  . BACK PAIN 06/13/2009  . MYALGIA 06/13/2009  . Anxiety state 12/13/2008  . LEG EDEMA, CHRONIC 11/08/2008  . Sleep apnea 10/26/2008  . Hypothyroidism 09/27/2008  . HYPERCHOLESTEROLEMIA 09/27/2008  . OBESITY 09/27/2008  . DEPRESSION 09/27/2008  . Essential hypertension 09/27/2008  . ALLERGIC RHINITIS 09/27/2008  . ASTHMA, CHILDHOOD 09/27/2008    Zannie Cove, PT 03/19/2017, 11:16 AM  [facility] [address], [place], [place], [address] Phone: [phone]   Fax:  [phone]  Name: [name] MRN: [id number] Date of Birth: 1934-09-18

## 2017-03-22 ENCOUNTER — Ambulatory Visit: Payer: Medicare Other | Admitting: Physical Therapy

## 2017-03-22 DIAGNOSIS — R269 Unspecified abnormalities of gait and mobility: Secondary | ICD-10-CM

## 2017-03-22 DIAGNOSIS — R6889 Other general symptoms and signs: Secondary | ICD-10-CM

## 2017-03-22 DIAGNOSIS — R2681 Unsteadiness on feet: Secondary | ICD-10-CM

## 2017-03-22 DIAGNOSIS — Z7409 Other reduced mobility: Secondary | ICD-10-CM

## 2017-03-22 DIAGNOSIS — R531 Weakness: Secondary | ICD-10-CM | POA: Diagnosis not present

## 2017-03-22 NOTE — Therapy (Signed)
[facility] [address], [place], [place], [address] Phone: [phone]   Fax:  [phone]  Physical Therapy Treatment  Patient Details  Name: [name] MRN: [id number] Date of Birth: 1934-12-28 Referring Provider: Dorothyann Peng  Encounter Date: 03/22/2017      PT End of Session - 03/22/17 0943    Visit Number 3   Number of Visits 16   Date for PT Re-Evaluation 05/12/17   PT Start Time 0932   PT Stop Time 1010   PT Time Calculation (min) 38 min   Activity Tolerance Patient tolerated treatment well;No increased pain   Behavior During Therapy WFL for tasks assessed/performed      Past Medical History:  Diagnosis Date  . Allergic rhinitis   . Anxiety state, unspecified    panic attacks  . CHF (congestive heart failure) ([place])   . Depressive disorder, not elsewhere classified   . Extrinsic asthma, unspecified    no problem since adulthood  . Obesity   . OSA on CPAP    severe  . Pure hypercholesterolemia   . Respiratory failure with hypoxia ([place]) 09/2008   acute, secondary to multiple bilateral pulmonary embolism , negative hypercoagulable workup 09/2008 hospital stay  . Scoliosis   . Syncope and collapse   . Type II or unspecified type diabetes mellitus without mention of complication, not stated as uncontrolled   . Unspecified essential hypertension   . Unspecified hypothyroidism    hypo    Past Surgical History:  Procedure Laterality Date  . EYE SURGERY    . JOINT REPLACEMENT    . knee replaced    . THYROID SURGERY      There were no vitals filed for this visit.      Subjective Assessment - 03/22/17 0934    Subjective I did my exercises all weekend and I feel like I am getting stronger.  I went out to lunch yesterday and it was the first time I have been out since June.  I was so tired after that.     Pertinent History L TKR 2013; Pt reports gout 1 month ago and then bout of stomach  virus which made pt only weaker. Has a caregiver who is present for almost 24 hours due to pt fear of being alone since her spouse died and that she may get sick in the middle of the night. Had HHPT 1 year ago and was able to amb with cane up until gout flareup 1 month ago.    Limitations Standing;Walking   How long can you sit comfortably? no problem   How long can you stand comfortably? 1-2 minutes   How long can you walk comfortably? < 30 sec   Patient Stated Goals to get back on my cane; to get stronger   Currently in Pain? No/denies                         [facility] Adult PT Treatment/Exercise - 03/22/17 0001      Neuro Re-ed    Neuro Re-ed Details  weight shifting heel/toes; tapping alternating leg on step, hip abduction - 10x each  sitting breaks needed due to fatigue     Knee/Hip Exercises: Aerobic   Nustep L1 6 min  seat 6     Knee/Hip Exercises: Seated   Long Arc Quad Strengthening;Right;Left;10 reps;2 sets  2#   Marching Strengthening;Both;10 reps  2#   Hamstring Curl Strengthening;Right;Left;15  reps  red band     Knee/Hip Exercises: Supine   Heel Slides Strengthening;Both;10 reps   Hip Adduction Isometric Strengthening;Both;10 reps  5 sec hold   Bridges Strengthening;Both;10 reps  mini bridge   Other Supine Knee/Hip Exercises hip abduction red band - 15x each side                  PT Short Term Goals - 03/22/17 0937      PT SHORT TERM GOAL #1   Title be independent in initial HEP   Time 4   Period Weeks   Status Achieved     PT SHORT TERM GOAL #2   Title walk for 4-5 min without fatigue to assist with community ambulation with rollator   Time 4   Period Weeks   Status On-going     PT SHORT TERM GOAL #3   Title stand x 6 min to assist with improved safe mobility   Time 4   Period Weeks   Status On-going           PT Long Term Goals - 03/22/17 0955      PT LONG TERM GOAL #2   Title Improve Berg to >/= 40/56 for  decreased falls risk   Time 8   Period Weeks   Status On-going     PT LONG TERM GOAL #3   Title Pt will be able to perform 8/12 on tinetti   Time 8   Status On-going     PT LONG TERM GOAL #4   Title Demo improved bil LE strength to 5/5 to assist with transfers across all venues   Time 8   Period Weeks   Status On-going     PT LONG TERM GOAL #5   Title Improve lumbar/core strength to good - to assist with longer duration ambulation in community   Time 8   Period Weeks   Status On-going     PT LONG TERM GOAL #6   Title Pt will be able to amb x 8-10 min with rollator MI without rest break to assist with community ambulation   Time 8   Period Weeks   Status On-going               Plan - 03/22/17 0943    Clinical Impression Statement Patient met short term goal of doing HEP.  She demonstrates increased strength and endurance with ablility to advance exercises.  Pt will benefit from skilled PT for improved endurance and strength to return to PLOF.   Rehab Potential Good   Clinical Impairments Affecting Rehab Potential watch for syncope   PT Treatment/Interventions Functional mobility training;Stair training;Gait training;DME Instruction;Therapeutic activities;Therapeutic exercise;Balance training;Neuromuscular re-education;Patient/family education;Energy conservation   PT Next Visit Plan LE strength, posture, standing and walking endurance, nu-step   Consulted and Agree with Plan of Care Patient      Patient will benefit from skilled therapeutic intervention in order to improve the following deficits and impairments:  Abnormal gait, Decreased activity tolerance, Decreased balance, Decreased mobility, Decreased endurance, Decreased strength, Difficulty walking, Obesity  Visit Diagnosis: Abnormality of gait  Decreased strength, endurance, and mobility  Unsteadiness on feet     Problem List Patient Active Problem List   Diagnosis Date Noted  . Thrombocytopenia  ([place]) 01/25/2017  . Chronic kidney disease (CKD) stage G4/A1, severely decreased glomerular filtration rate (GFR) between 15-29 mL/min/1.73 square meter and albuminuria creatinine ratio less than 30 mg/g ([facility]) 09/04/2015  . Chronic kidney disease (  CKD), stage IV (severe) ([place]) 09/04/2015  . Hypoxia 08/24/2015  . Bronchitis 08/24/2015  . Acute respiratory failure with hypoxia ([place]) 08/24/2015  . Chronic kidney disease 08/24/2015  . Diabetes type 2, uncontrolled ([place]) 08/05/2010  . GOUT 06/23/2010  . DERMATITIS 01/16/2010  . BACK PAIN 06/13/2009  . MYALGIA 06/13/2009  . Anxiety state 12/13/2008  . LEG EDEMA, CHRONIC 11/08/2008  . Sleep apnea 10/26/2008  . Hypothyroidism 09/27/2008  . HYPERCHOLESTEROLEMIA 09/27/2008  . OBESITY 09/27/2008  . DEPRESSION 09/27/2008  . Essential hypertension 09/27/2008  . ALLERGIC RHINITIS 09/27/2008  . ASTHMA, CHILDHOOD 09/27/2008    Zannie Cove, PT 03/22/2017, 10:10 AM  [facility] [address], [place], [place], [address] Phone: [phone]   Fax:  [phone]  Name: [name] MRN: [id number] Date of Birth: 1935/01/08

## 2017-03-23 ENCOUNTER — Ambulatory Visit (INDEPENDENT_AMBULATORY_CARE_PROVIDER_SITE_OTHER): Payer: Medicare Other

## 2017-03-23 VITALS — BP 136/58 | HR 78 | Resp 16 | Ht 62.0 in | Wt 240.7 lb

## 2017-03-23 DIAGNOSIS — Z780 Asymptomatic menopausal state: Secondary | ICD-10-CM

## 2017-03-23 DIAGNOSIS — E118 Type 2 diabetes mellitus with unspecified complications: Secondary | ICD-10-CM | POA: Diagnosis not present

## 2017-03-23 DIAGNOSIS — Z0000 Encounter for general adult medical examination without abnormal findings: Secondary | ICD-10-CM

## 2017-03-23 DIAGNOSIS — E1165 Type 2 diabetes mellitus with hyperglycemia: Secondary | ICD-10-CM

## 2017-03-23 DIAGNOSIS — Z1231 Encounter for screening mammogram for malignant neoplasm of breast: Secondary | ICD-10-CM

## 2017-03-23 DIAGNOSIS — Z1239 Encounter for other screening for malignant neoplasm of breast: Secondary | ICD-10-CM

## 2017-03-23 NOTE — Progress Notes (Signed)
I have reviewed and agree with this plan  

## 2017-03-23 NOTE — Patient Instructions (Signed)
Schedule bone density and mammogram. Call [facility] [phone] to find out what activities they offer. Attend Diabetic Nutrition Class, ask them regarding cost when they call. Continue Physical Therapy per recommendation from Physical therapy. Diabetes Mellitus and Food It is important for you to manage your blood sugar (glucose) level. Your blood glucose level can be greatly affected by what you eat. Eating healthier foods in the appropriate amounts throughout the day at about the same time each day will help you control your blood glucose level. It can also help slow or prevent worsening of your diabetes mellitus. Healthy eating may even help you improve the level of your blood pressure and reach or maintain a healthy weight. General recommendations for healthful eating and cooking habits include:  Eating meals and snacks regularly. Avoid going long periods of time without eating to lose weight.  Eating a diet that consists mainly of plant-based foods, such as fruits, vegetables, nuts, legumes, and whole grains.  Using low-heat cooking methods, such as baking, instead of high-heat cooking methods, such as deep frying.  Work with your dietitian to make sure you understand how to use the Nutrition Facts information on food labels. How can food affect me? Carbohydrates Carbohydrates affect your blood glucose level more than any other type of food. Your dietitian will help you determine how many carbohydrates to eat at each meal and teach you how to count carbohydrates. Counting carbohydrates is important to keep your blood glucose at a healthy level, especially if you are using insulin or taking certain medicines for diabetes mellitus. Alcohol Alcohol can cause sudden decreases in blood glucose (hypoglycemia), especially if you use insulin or take certain medicines for diabetes mellitus. Hypoglycemia can be a life-threatening condition. Symptoms of hypoglycemia (sleepiness, dizziness, and  disorientation) are similar to symptoms of having too much alcohol. If your health care provider has given you approval to drink alcohol, do so in moderation and use the following guidelines:  Women should not have more than one drink per day, and men should not have more than two drinks per day. One drink is equal to: ? 12 oz of beer. ? 5 oz of wine. ? 1 oz of hard liquor.  Do not drink on an empty stomach.  Keep yourself hydrated. Have water, diet soda, or unsweetened iced tea.  Regular soda, juice, and other mixers might contain a lot of carbohydrates and should be counted.  What foods are not recommended? As you make food choices, it is important to remember that all foods are not the same. Some foods have fewer nutrients per serving than other foods, even though they might have the same number of calories or carbohydrates. It is difficult to get your body what it needs when you eat foods with fewer nutrients. Examples of foods that you should avoid that are high in calories and carbohydrates but low in nutrients include:  Trans fats (most processed foods list trans fats on the Nutrition Facts label).  Regular soda.  Juice.  Candy.  Sweets, such as cake, pie, doughnuts, and cookies.  Fried foods.  What foods can I eat? Eat nutrient-rich foods, which will nourish your body and keep you healthy. The food you should eat also will depend on several factors, including:  The calories you need.  The medicines you take.  Your weight.  Your blood glucose level.  Your blood pressure level.  Your cholesterol level.  You should eat a variety of foods, including:  Protein. ? Lean cuts  of meat. ? Proteins low in saturated fats, such as fish, egg whites, and beans. Avoid processed meats.  Fruits and vegetables. ? Fruits and vegetables that may help control blood glucose levels, such as apples, mangoes, and yams.  Dairy products. ? Choose fat-free or low-fat dairy products,  such as milk, yogurt, and cheese.  Grains, bread, pasta, and rice. ? Choose whole grain products, such as multigrain bread, whole oats, and brown rice. These foods may help control blood pressure.  Fats. ? Foods containing healthful fats, such as nuts, avocado, olive oil, canola oil, and fish.  Does everyone with diabetes mellitus have the same meal plan? Because every person with diabetes mellitus is different, there is not one meal plan that works for everyone. It is very important that you meet with a dietitian who will help you create a meal plan that is just right for you. This information is not intended to replace advice given to you by your health care provider. Make sure you discuss any questions you have with your health care provider. Document Released: 05/21/2005 Document Revised: 01/30/2016 Document Reviewed: 07/21/2013 Elsevier Interactive Patient Education  2017 [employer].

## 2017-03-24 ENCOUNTER — Ambulatory Visit: Payer: Medicare Other

## 2017-03-24 DIAGNOSIS — R269 Unspecified abnormalities of gait and mobility: Secondary | ICD-10-CM

## 2017-03-24 DIAGNOSIS — Z7409 Other reduced mobility: Secondary | ICD-10-CM | POA: Diagnosis not present

## 2017-03-24 DIAGNOSIS — R531 Weakness: Secondary | ICD-10-CM

## 2017-03-24 DIAGNOSIS — R6889 Other general symptoms and signs: Secondary | ICD-10-CM

## 2017-03-24 DIAGNOSIS — R2681 Unsteadiness on feet: Secondary | ICD-10-CM

## 2017-03-24 NOTE — Therapy (Signed)
[facility] [address] [place], [place], [address] Phone: [phone]   Fax:  [phone]  Physical Therapy Treatment  Patient Details  Name: [name] MRN: [id number] Date of Birth: [birth year] Referring Provider: Dorothyann Peng  Encounter Date: 03/24/2017      PT End of Session - 03/24/17 0853    Visit Number 4   Number of Visits 16   Date for PT Re-Evaluation 05/12/17   PT Start Time 7673  pt. arrived late    PT Stop Time 0929   PT Time Calculation (min) 39 min   Activity Tolerance Patient tolerated treatment well;No increased pain   Behavior During Therapy WFL for tasks assessed/performed      Past Medical History:  Diagnosis Date  . Allergic rhinitis   . Anxiety state, unspecified    panic attacks  . CHF (congestive heart failure) ([place])   . Depressive disorder, not elsewhere classified   . Extrinsic asthma, unspecified    no problem since adulthood  . Obesity   . OSA on CPAP    severe  . Pure hypercholesterolemia   . Respiratory failure with hypoxia ([place]) 09/2008   acute, secondary to multiple bilateral pulmonary embolism , negative hypercoagulable workup 09/2008 hospital stay  . Scoliosis   . Syncope and collapse   . Type II or unspecified type diabetes mellitus without mention of complication, not stated as uncontrolled   . Unspecified essential hypertension   . Unspecified hypothyroidism    hypo    Past Surgical History:  Procedure Laterality Date  . EYE SURGERY    . JOINT REPLACEMENT    . knee replaced    . THYROID SURGERY      There were no vitals filed for this visit.      Subjective Assessment - 03/24/17 0853    Subjective Pt. doing well today.     Limitations House hold activities   Patient Stated Goals to get back on my cane; to get stronger   Currently in Pain? No/denies   Pain Score 0-No pain   Multiple Pain Sites No                          [facility] Adult PT Treatment/Exercise - 03/24/17 0859      Self-Care   Self-Care Other Self-Care Comments   Other Self-Care Comments  discussion with pt. regarding benefit of buying adjustable ankle weights for home use with HEP      Knee/Hip Exercises: Standing   Hip Abduction Right;Left;Stengthening;Knee straight;10 reps   Hip Extension Right;Left;Stengthening;10 reps;Knee straight   Other Standing Knee Exercises --   Other Standing Knee Exercises Standing forward lateral toe-clears to 8" step x 10 each way at counter     Knee/Hip Exercises: Seated   Long Arc Quad Strengthening;Right;Left;1 set;15 reps   Long Arc Quad Weight 2 lbs.   Long [employer] Limitations with adduction ball squeeze    Marching Strengthening;15 reps;Left;Right   Hamstring Curl Strengthening;Right;Left;10 reps;2 sets   Sit to Sand 10 reps;without UE support                  PT Short Term Goals - 03/22/17 4193      PT SHORT TERM GOAL #1   Title be independent in initial HEP   Time 4   Period Weeks   Status Achieved     PT SHORT TERM GOAL #2   Title walk for  4-5 min without fatigue to assist with community ambulation with rollator   Time 4   Period Weeks   Status On-going     PT SHORT TERM GOAL #3   Title stand x 6 min to assist with improved safe mobility   Time 4   Period Weeks   Status On-going           PT Long Term Goals - 03/22/17 0955      PT LONG TERM GOAL #2   Title Improve Berg to >/= 40/56 for decreased falls risk   Time 8   Period Weeks   Status On-going     PT LONG TERM GOAL #3   Title Pt will be able to perform 8/12 on tinetti   Time 8   Status On-going     PT LONG TERM GOAL #4   Title Demo improved bil LE strength to 5/5 to assist with transfers across all venues   Time 8   Period Weeks   Status On-going     PT LONG TERM GOAL #5   Title Improve lumbar/core strength to good - to assist with longer duration ambulation in community   Time 8   Period Weeks    Status On-going     PT LONG TERM GOAL #6   Title Pt will be able to amb x 8-10 min with rollator MI without rest break to assist with community ambulation   Time 8   Period Weeks   Status On-going               Plan - 03/24/17 2774    Clinical Impression Statement Pt. doing well today.  Tolerated all standing LE strengthening and balance training well today.  Periodic seated rest breaks due to LE fatigue.  May be ready to initiate short trials of [facility] training in coming visits.     PT Treatment/Interventions Functional mobility training;Stair training;Gait training;DME Instruction;Therapeutic activities;Therapeutic exercise;Balance training;Neuromuscular re-education;Patient/family education;Energy conservation   PT Next Visit Plan LE strength, posture, standing and walking endurance, nu-step      Patient will benefit from skilled therapeutic intervention in order to improve the following deficits and impairments:  Abnormal gait, Decreased activity tolerance, Decreased balance, Decreased mobility, Decreased endurance, Decreased strength, Difficulty walking, Obesity  Visit Diagnosis: Abnormality of gait  Decreased strength, endurance, and mobility  Unsteadiness on feet     Problem List Patient Active Problem List   Diagnosis Date Noted  . Thrombocytopenia ([place]) 01/25/2017  . Chronic kidney disease (CKD) stage G4/A1, severely decreased glomerular filtration rate (GFR) between 15-29 mL/min/1.73 square meter and albuminuria creatinine ratio less than 30 mg/g (HCC) 09/04/2015  . Chronic kidney disease (CKD), stage IV (severe) ([place]) 09/04/2015  . Hypoxia 08/24/2015  . Bronchitis 08/24/2015  . Acute respiratory failure with hypoxia ([place]) 08/24/2015  . Chronic kidney disease 08/24/2015  . Diabetes type 2, uncontrolled ([place]) 08/05/2010  . GOUT 06/23/2010  . DERMATITIS 01/16/2010  . BACK PAIN 06/13/2009  . MYALGIA 06/13/2009  . Anxiety state 12/13/2008  . LEG EDEMA, CHRONIC  11/08/2008  . Sleep apnea 10/26/2008  . Hypothyroidism 09/27/2008  . HYPERCHOLESTEROLEMIA 09/27/2008  . OBESITY 09/27/2008  . DEPRESSION 09/27/2008  . Essential hypertension 09/27/2008  . ALLERGIC RHINITIS 09/27/2008  . ASTHMA, CHILDHOOD 09/27/2008    Bess Harvest, PTA 03/24/17 11:36 AM  [facility] [address] [place], [place], [address] Phone: [phone]   Fax:  [phone]  Name: LOUETTA HOLLINGSHEAD MRN:  [id number] Date of Birth: 1934/12/03

## 2017-03-25 DIAGNOSIS — M5134 Other intervertebral disc degeneration, thoracic region: Secondary | ICD-10-CM | POA: Diagnosis not present

## 2017-03-25 DIAGNOSIS — M546 Pain in thoracic spine: Secondary | ICD-10-CM | POA: Diagnosis not present

## 2017-03-25 DIAGNOSIS — M47814 Spondylosis without myelopathy or radiculopathy, thoracic region: Secondary | ICD-10-CM | POA: Diagnosis not present

## 2017-03-25 DIAGNOSIS — M9902 Segmental and somatic dysfunction of thoracic region: Secondary | ICD-10-CM | POA: Diagnosis not present

## 2017-04-01 DIAGNOSIS — M9902 Segmental and somatic dysfunction of thoracic region: Secondary | ICD-10-CM | POA: Diagnosis not present

## 2017-04-01 DIAGNOSIS — M47814 Spondylosis without myelopathy or radiculopathy, thoracic region: Secondary | ICD-10-CM | POA: Diagnosis not present

## 2017-04-01 DIAGNOSIS — M5134 Other intervertebral disc degeneration, thoracic region: Secondary | ICD-10-CM | POA: Diagnosis not present

## 2017-04-01 DIAGNOSIS — M546 Pain in thoracic spine: Secondary | ICD-10-CM | POA: Diagnosis not present

## 2017-04-06 DIAGNOSIS — M47814 Spondylosis without myelopathy or radiculopathy, thoracic region: Secondary | ICD-10-CM | POA: Diagnosis not present

## 2017-04-06 DIAGNOSIS — M9902 Segmental and somatic dysfunction of thoracic region: Secondary | ICD-10-CM | POA: Diagnosis not present

## 2017-04-06 DIAGNOSIS — M5134 Other intervertebral disc degeneration, thoracic region: Secondary | ICD-10-CM | POA: Diagnosis not present

## 2017-04-06 DIAGNOSIS — M546 Pain in thoracic spine: Secondary | ICD-10-CM | POA: Diagnosis not present

## 2017-04-08 DIAGNOSIS — M47814 Spondylosis without myelopathy or radiculopathy, thoracic region: Secondary | ICD-10-CM | POA: Diagnosis not present

## 2017-04-08 DIAGNOSIS — M5134 Other intervertebral disc degeneration, thoracic region: Secondary | ICD-10-CM | POA: Diagnosis not present

## 2017-04-08 DIAGNOSIS — M546 Pain in thoracic spine: Secondary | ICD-10-CM | POA: Diagnosis not present

## 2017-04-08 DIAGNOSIS — M9902 Segmental and somatic dysfunction of thoracic region: Secondary | ICD-10-CM | POA: Diagnosis not present

## 2017-04-09 ENCOUNTER — Telehealth: Payer: Self-pay | Admitting: Adult Health

## 2017-04-09 ENCOUNTER — Ambulatory Visit: Payer: Medicare Other | Attending: Adult Health | Admitting: Physical Therapy

## 2017-04-09 ENCOUNTER — Encounter: Payer: Self-pay | Admitting: Adult Health

## 2017-04-09 ENCOUNTER — Ambulatory Visit (INDEPENDENT_AMBULATORY_CARE_PROVIDER_SITE_OTHER): Payer: Medicare Other | Admitting: Adult Health

## 2017-04-09 VITALS — BP 124/54 | Temp 98.7°F | Wt 246.0 lb

## 2017-04-09 DIAGNOSIS — N183 Chronic kidney disease, stage 3 unspecified: Secondary | ICD-10-CM

## 2017-04-09 DIAGNOSIS — R531 Weakness: Secondary | ICD-10-CM | POA: Insufficient documentation

## 2017-04-09 DIAGNOSIS — R269 Unspecified abnormalities of gait and mobility: Secondary | ICD-10-CM

## 2017-04-09 DIAGNOSIS — Z7409 Other reduced mobility: Secondary | ICD-10-CM | POA: Diagnosis not present

## 2017-04-09 DIAGNOSIS — Z794 Long term (current) use of insulin: Secondary | ICD-10-CM

## 2017-04-09 DIAGNOSIS — R6889 Other general symptoms and signs: Secondary | ICD-10-CM

## 2017-04-09 DIAGNOSIS — R2681 Unsteadiness on feet: Secondary | ICD-10-CM | POA: Diagnosis not present

## 2017-04-09 DIAGNOSIS — E1165 Type 2 diabetes mellitus with hyperglycemia: Secondary | ICD-10-CM | POA: Diagnosis not present

## 2017-04-09 DIAGNOSIS — IMO0002 Reserved for concepts with insufficient information to code with codable children: Secondary | ICD-10-CM

## 2017-04-09 DIAGNOSIS — E1122 Type 2 diabetes mellitus with diabetic chronic kidney disease: Secondary | ICD-10-CM

## 2017-04-09 LAB — GLUCOSE, POCT (MANUAL RESULT ENTRY): POC Glucose: 350 mg/dl — AB (ref 70–99)

## 2017-04-09 NOTE — Telephone Encounter (Signed)
Pt coming in today

## 2017-04-09 NOTE — Therapy (Signed)
[facility] [address], [place], [place], [address] Phone: [phone]   Fax:  [phone]  Physical Therapy Treatment  Patient Details  Name: [name] MRN: [id number] Date of Birth: Jan 18, 1935 Referring Provider: Dorothyann Peng  Encounter Date: 04/09/2017      PT End of Session - 04/09/17 1143    Visit Number 5   Number of Visits 16   Date for PT Re-Evaluation 05/12/17   PT Start Time 1120  arrived 20 min late   PT Stop Time 1144   PT Time Calculation (min) 24 min   Activity Tolerance Patient tolerated treatment well   Behavior During Therapy [facility] for tasks assessed/performed      Past Medical History:  Diagnosis Date  . Allergic rhinitis   . Anxiety state, unspecified    panic attacks  . CHF (congestive heart failure) ([place])   . Depressive disorder, not elsewhere classified   . Extrinsic asthma, unspecified    no problem since adulthood  . Obesity   . OSA on CPAP    severe  . Pure hypercholesterolemia   . Respiratory failure with hypoxia ([place]) 09/2008   acute, secondary to multiple bilateral pulmonary embolism , negative hypercoagulable workup 09/2008 hospital stay  . Scoliosis   . Syncope and collapse   . Type II or unspecified type diabetes mellitus without mention of complication, not stated as uncontrolled   . Unspecified essential hypertension   . Unspecified hypothyroidism    hypo    Past Surgical History:  Procedure Laterality Date  . EYE SURGERY    . JOINT REPLACEMENT    . knee replaced    . THYROID SURGERY      There were no vitals filed for this visit.      Subjective Assessment - 04/09/17 1121    Subjective Pt reports she was sick the other day.  She was late due to having wrong appointment time written down.   Pertinent History L TKR 2013; Pt reports gout 1 month ago and then bout of stomach virus which made pt only weaker. Has a caregiver who is present for almost 24  hours due to pt fear of being alone since her spouse died and that she may get sick in the middle of the night. Had HHPT 1 year ago and was able to amb with cane up until gout flareup 1 month ago.    Patient Stated Goals to get back on my cane; to get stronger   Currently in Pain? No/denies                         [facility] Adult PT Treatment/Exercise - 04/09/17 0001      Knee/Hip Exercises: Standing   Hip Flexion Stengthening;Right;Left;10 reps;Knee bent  2 lb   Hip Abduction Right;Left;Stengthening;Knee straight;10 reps  2 lb   Hip Extension Right;Left;Stengthening;10 reps;Knee straight  2 lb     Knee/Hip Exercises: Seated   Long Arc Quad Strengthening;Right;Left;15 reps;2 sets   Long Arc Quad Weight 2 lbs.   Marching Strengthening;Left;Right;10 reps  2 lb   Hamstring Curl Strengthening;Right;Left;10 reps;2 sets   Sit to Sand 10 reps;without UE support                  PT Short Term Goals - 04/09/17 1145      PT SHORT TERM GOAL #2   Title walk for 4-5 min without fatigue to assist with community ambulation  with rollator   Time 4   Period Weeks   Status On-going     PT SHORT TERM GOAL #3   Title stand x 6 min to assist with improved safe mobility   Time 4   Period Weeks   Status On-going     PT SHORT TERM GOAL #4   Title Perform 10 sit to stands without rest break to assist with safe transfers at home   Baseline 5   Time 4   Period Weeks   Status On-going           PT Long Term Goals - 03/22/17 0955      PT LONG TERM GOAL #2   Title Improve Berg to >/= 40/56 for decreased falls risk   Time 8   Period Weeks   Status On-going     PT LONG TERM GOAL #3   Title Pt will be able to perform 8/12 on tinetti   Time 8   Status On-going     PT LONG TERM GOAL #4   Title Demo improved bil LE strength to 5/5 to assist with transfers across all venues   Time 8   Period Weeks   Status On-going     PT LONG TERM GOAL #5   Title Improve  lumbar/core strength to good - to assist with longer duration ambulation in community   Time 8   Period Weeks   Status On-going     PT LONG TERM GOAL #6   Title Pt will be able to amb x 8-10 min with rollator MI without rest break to assist with community ambulation   Time 8   Period Weeks   Status On-going               Plan - 04/09/17 1146    Clinical Impression Statement Pt needed breaks.  Pt had high blood sugar (234) when she left the house.  She was shaky and fatigued quickly.  Pt able to perform sit to stand 5x without a break.  pt continues to need skilled PT for increased endurance.   Clinical Impairments Affecting Rehab Potential watch for syncope   PT Treatment/Interventions Functional mobility training;Stair training;Gait training;DME Instruction;Therapeutic activities;Therapeutic exercise;Balance training;Neuromuscular re-education;Patient/family education;Energy conservation   PT Next Visit Plan LE strength, sit to stand endurance, standing and walking endurance, nu-step   Consulted and Agree with Plan of Care Patient      Patient will benefit from skilled therapeutic intervention in order to improve the following deficits and impairments:  Abnormal gait, Decreased activity tolerance, Decreased balance, Decreased mobility, Decreased endurance, Decreased strength, Difficulty walking, Obesity  Visit Diagnosis: Abnormality of gait  Decreased strength, endurance, and mobility  Unsteadiness on feet     Problem List Patient Active Problem List   Diagnosis Date Noted  . Thrombocytopenia ([place]) 01/25/2017  . Chronic kidney disease (CKD) stage G4/A1, severely decreased glomerular filtration rate (GFR) between 15-29 mL/min/1.73 square meter and albuminuria creatinine ratio less than 30 mg/g (HCC) 09/04/2015  . Chronic kidney disease (CKD), stage IV (severe) ([place]) 09/04/2015  . Hypoxia 08/24/2015  . Bronchitis 08/24/2015  . Acute respiratory failure with hypoxia  ([place]) 08/24/2015  . Chronic kidney disease 08/24/2015  . Diabetes type 2, uncontrolled ([place]) 08/05/2010  . GOUT 06/23/2010  . DERMATITIS 01/16/2010  . BACK PAIN 06/13/2009  . MYALGIA 06/13/2009  . Anxiety state 12/13/2008  . LEG EDEMA, CHRONIC 11/08/2008  . Sleep apnea 10/26/2008  . Hypothyroidism 09/27/2008  . HYPERCHOLESTEROLEMIA 09/27/2008  .  OBESITY 09/27/2008  . DEPRESSION 09/27/2008  . Essential hypertension 09/27/2008  . ALLERGIC RHINITIS 09/27/2008  . ASTHMA, CHILDHOOD 09/27/2008    [name], PT 04/09/2017, 11:49 AM  [facility] [address] [place], [place], [address] Phone: [phone]   Fax:  [phone]  Name: [name] MRN: [id number] Date of Birth: 07/17/1935

## 2017-04-09 NOTE — Telephone Encounter (Signed)
Patient Name: [name] DOB: 12-Nov-1934 Initial Comment Caller states she is having problems with her Mallory Shirk trying to get in touch with an endocrinologist but cannot get her calls returned. Wanting to try to get in with the endocrinologist. BS keeps going high, reached 511 the other day, was 269 this morning so it is coming down Nurse Assessment Nurse: Richardson Landry, RN, Aldona Bar Date/Time (Eastern Time): 04/09/2017 2:07:13 PM Confirm and document reason for call. If symptomatic, describe symptoms. ---Caller states this morning it was 268, before bed it was 191. Pt states she only feels sleepy and may be because her hemoglobin is not right and is taking blood thinner. Caller does not know current blood sugar and cannot get to glucometer. Pt denies rapid breathing or other sx besides feeling sleepy. Pt takes insulin and oral medications, pt began taking glipizide along with 70-30 humulin, takes 70u in the morning with glipizide, 15u at noon, 65u at night and glipizide. Endocrinologist in [place] was managing her diabetes medication. Called endocrinologist on Tuesday, nurse called her back, told her she would speak to dr and call her back. Pt has tried calling her back since but has been unable to reach them. Does the patient have any new or worsening symptoms? ---Yes Will a triage be completed? ---Yes Related visit to physician within the last 2 weeks? ---No Does the PT have any chronic conditions? (i.e. diabetes, asthma, etc.) ---Yes List chronic conditions. ---diabetes, hx stage 3 kidney disease, Is this a behavioral health or substance abuse call? ---No Guidelines Guideline Title Affirmed Question Affirmed Notes Diabetes - High Blood Sugar Caller has URGENT medication or insulin pump question and triager unable to answer question Final Disposition User Call PCP Now Richardson Landry, RN, Samantha Comments Caller states Dr Chalmers Cater is her current endocrinologist. RN gave report to [facility]  in office, RN warm transferred pt to [facility] and she scheduled appt for pt at 4pm today. Referrals REFERRED TO PCP OFFICE

## 2017-04-09 NOTE — Progress Notes (Signed)
Subjective:    Patient ID: [name], female    DOB: Feb 11, 1935, 82 y.o.   MRN: [id number]  HPI  82 year old female who  has a past medical history of Allergic rhinitis; Anxiety state, unspecified; CHF (congestive heart failure) ([place]); Depressive disorder, not elsewhere classified; Extrinsic asthma, unspecified; Obesity; OSA on CPAP; Pure hypercholesterolemia; Respiratory failure with hypoxia ([place]) (09/2008); Scoliosis; Syncope and collapse; Type II or unspecified type diabetes mellitus without mention of complication, not stated as uncontrolled; Unspecified essential hypertension; and Unspecified hypothyroidism.  She presents to the office today for the complaint of increasing blood sugars. She is currently maintained on glipizide 5 mg BID and Humalin 70/30 Inject 70 units under the skin in the morning, 15 units at noon and 65 units every evening.  She reports that over the last few weeks her blood sugars have been increasing. She had blood sugar readings 500+ earlier this week. She has not had any blood sugars below 250 this week.   She reports taking her medications as directed as well as trying to eat a diabetic diet.   Today in the office her blood sugar is 350  Review of Systems See HPI   Past Medical History:  Diagnosis Date  . Allergic rhinitis   . Anxiety state, unspecified    panic attacks  . CHF (congestive heart failure) ([place])   . Depressive disorder, not elsewhere classified   . Extrinsic asthma, unspecified    no problem since adulthood  . Obesity   . OSA on CPAP    severe  . Pure hypercholesterolemia   . Respiratory failure with hypoxia ([place]) 09/2008   acute, secondary to multiple bilateral pulmonary embolism , negative hypercoagulable workup 09/2008 hospital stay  . Scoliosis   . Syncope and collapse   . Type II or unspecified type diabetes mellitus without mention of complication, not stated as uncontrolled   . Unspecified essential hypertension   .  Unspecified hypothyroidism    hypo    Social History   Social History  . Marital status: Widowed    Spouse name: N/A  . Number of children: 2  . Years of education: N/A   Occupational History  . 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 Smoking status: Former Smoker    Years: 20.00    Quit date: 09/07/1989  . Smokeless tobacco: Never Used  . Alcohol use No  . Drug use: No  . Sexual activity: Not on file   Other Topics Concern  . Not on file   Social History Narrative  . No narrative on file    Past Surgical History:  Procedure Laterality Date  . EYE SURGERY    . JOINT REPLACEMENT    . knee replaced    . THYROID SURGERY      Family History  Problem Relation Age of Onset  . Allergies Mother   . Clotting disorder Mother   . Osteoarthritis Mother   . Asthma Mother   . Arthritis Other   . Diabetes Other   . Hyperlipidemia Other   . Hypertension Other   . Coronary artery disease Other   . Stroke Other   . Osteoarthritis Daughter   . Rheum arthritis Maternal Grandmother   . Clotting disorder Maternal Grandmother   . Clotting disorder Maternal Uncle   . Clotting disorder Daughter   . Allergies Daughter     Allergies  Allergen Reactions  .  Metformin Diarrhea  . Pineapple Swelling    Throat swells and blisters on tongue and roof of mouth per patient  . Tetracycline Hives  . Aspirin Itching and Rash  . Contrast Media [Iodinated Diagnostic Agents] Itching       . Flagyl [Metronidazole] Other (See Comments)    Unknown   . Fluticasone-Salmeterol Itching and Rash  . Lactose Intolerance (Gi) Other (See Comments)    gas  . Latex Itching and Rash  . Lisinopril Cough  . Sulfonamide Derivatives Itching and Rash    Current Outpatient Prescriptions on File Prior to Visit  Medication Sig Dispense Refill  . apixaban (ELIQUIS) 5 MG TABS tablet Take 1 tablet (5 mg total) by mouth 2 (two) times daily. 60 tablet 3  .  atorvastatin (LIPITOR) 80 MG tablet Take 1 tablet (80 mg total) by mouth daily. (Patient taking differently: Take 40 mg by mouth daily. ) 90 tablet 3  . BIOTIN PO Take 1 tablet by mouth daily.    . Cholecalciferol (VITAMIN D3) 2000 units TABS Take 2,000 Units by mouth daily.    . Coenzyme Q10 200 MG capsule Take 200 mg by mouth daily.      Marland Kitchen docusate sodium (COLACE) 100 MG capsule Take 1 capsule by mouth daily.    . fexofenadine (ALLEGRA) 180 MG tablet Take 180 mg by mouth daily.      . furosemide (LASIX) 20 MG tablet TAKE 2 TABLETS BY MOUTH DAILY 180 tablet 2  . Insulin Pen Needle (B-D UF III MINI PEN NEEDLES) 31G X 5 MM MISC Use daily for insulin injection 100 each 2  . Insulin Syringe-Needle U-100 (INSULIN SYRINGE 1CC/30GX5/16") 30G X 5/16" 1 ML MISC USE BID UTD  11  . levothyroxine (SYNTHROID, LEVOTHROID) 112 MCG tablet Take 1 tablet (112 mcg total) by mouth daily. 90 tablet 3  . Multiple Vitamin (MULTIVITAMIN) tablet Take 1 tablet by mouth daily.      . potassium citrate (UROCIT-K) 10 MEQ (1080 MG) SR tablet Take 20 mEq by mouth 2 (two) times daily.    . Probiotic Product (PROBIOTIC PO) Take 1 capsule by mouth daily.    . ranitidine (ZANTAC) 150 MG tablet Take 150 mg by mouth daily as needed for heartburn.    . sertraline (ZOLOFT) 50 MG tablet Take 1 tablet (50 mg total) by mouth daily. 90 tablet 1  . vitamin C (ASCORBIC ACID) 250 MG tablet Take 250 mg by mouth daily.    Marland Kitchen glipiZIDE (GLUCOTROL) 5 MG tablet Take 1 tablet (5 mg total) by mouth 2 (two) times daily before a meal. 180 tablet 3   No current facility-administered medications on file prior to visit.     BP (!) 124/54   Temp 98.7 F (37.1 C) (Oral)   Wt 246 lb (111.6 kg)   BMI 44.99 kg/m       Objective:   Physical Exam  Constitutional: She is oriented to person, place, and time. She appears well-developed and well-nourished. No distress.  Cardiovascular: Normal rate, regular rhythm, normal heart sounds and intact distal  pulses.  Exam reveals no gallop and no friction rub.   No murmur heard. Pulmonary/Chest: Effort normal and breath sounds normal. No respiratory distress. She has no wheezes. She has no rales. She exhibits no tenderness.  Neurological: She is alert and oriented to person, place, and time.  Skin: Skin is warm and dry. No rash noted. She is not diaphoretic. No erythema. No pallor.  Psychiatric: She has a  normal mood and affect. Her behavior is normal. Judgment and thought content normal.  Nursing note and vitals reviewed.     Assessment & Plan:  1. Uncontrolled type 2 diabetes mellitus with stage 3 chronic kidney disease, with long-term current use of insulin (HCC) - I am going to have her increase Glipizide to 10 mg BID. She reports having plenty of medication at home to do this.  - Send me blood sugar readings next week via mychart  - Please go to the ER if BS continues to be above 400 over the weekend.  - POC Glucose ([facility])- 350 - Ambulatory referral to Endocrinology  Dorothyann Peng, NP

## 2017-04-13 ENCOUNTER — Telehealth: Payer: Self-pay | Admitting: Endocrinology

## 2017-04-13 DIAGNOSIS — M9902 Segmental and somatic dysfunction of thoracic region: Secondary | ICD-10-CM | POA: Diagnosis not present

## 2017-04-13 DIAGNOSIS — M5134 Other intervertebral disc degeneration, thoracic region: Secondary | ICD-10-CM | POA: Diagnosis not present

## 2017-04-13 DIAGNOSIS — M47814 Spondylosis without myelopathy or radiculopathy, thoracic region: Secondary | ICD-10-CM | POA: Diagnosis not present

## 2017-04-13 DIAGNOSIS — M546 Pain in thoracic spine: Secondary | ICD-10-CM | POA: Diagnosis not present

## 2017-04-13 NOTE — Telephone Encounter (Signed)
Patient called in reference to needing a NP appointment. Please call patient and advise. OK to leave message.

## 2017-04-14 ENCOUNTER — Ambulatory Visit: Payer: Medicare Other

## 2017-04-14 DIAGNOSIS — R2681 Unsteadiness on feet: Secondary | ICD-10-CM

## 2017-04-14 DIAGNOSIS — Z7409 Other reduced mobility: Secondary | ICD-10-CM | POA: Diagnosis not present

## 2017-04-14 DIAGNOSIS — R531 Weakness: Secondary | ICD-10-CM

## 2017-04-14 DIAGNOSIS — R6889 Other general symptoms and signs: Secondary | ICD-10-CM

## 2017-04-14 DIAGNOSIS — R269 Unspecified abnormalities of gait and mobility: Secondary | ICD-10-CM

## 2017-04-14 NOTE — Therapy (Addendum)
[facility] [address], [place], [place], [address] Phone: [phone]   Fax:  [phone]  Physical Therapy Treatment  Patient Details  Name: [name] MRN: [id number] Date of Birth: 1935/08/22 Referring Provider: Dorothyann Peng  Encounter Date: 04/14/2017      PT End of Session - 04/14/17 1115    Visit Number 6   Number of Visits 16   Date for PT Re-Evaluation 05/12/17   PT Start Time 6962   PT Stop Time 1140   PT Time Calculation (min) 38 min   Activity Tolerance Patient tolerated treatment well   Behavior During Therapy [facility] for tasks assessed/performed      Past Medical History:  Diagnosis Date  . Allergic rhinitis   . Anxiety state, unspecified    panic attacks  . CHF (congestive heart failure) ([place])   . Depressive disorder, not elsewhere classified   . Extrinsic asthma, unspecified    no problem since adulthood  . Obesity   . OSA on CPAP    severe  . Pure hypercholesterolemia   . Respiratory failure with hypoxia ([place]) 09/2008   acute, secondary to multiple bilateral pulmonary embolism , negative hypercoagulable workup 09/2008 hospital stay  . Scoliosis   . Syncope and collapse   . Type II or unspecified type diabetes mellitus without mention of complication, not stated as uncontrolled   . Unspecified essential hypertension   . Unspecified hypothyroidism    hypo    Past Surgical History:  Procedure Laterality Date  . EYE SURGERY    . JOINT REPLACEMENT    . knee replaced    . THYROID SURGERY      There were no vitals filed for this visit.      Subjective Assessment - 04/14/17 1105    Subjective Pt. doing well today.  Notes blood sugar ~ 175 today.   Patient Stated Goals to get back on my cane; to get stronger   Currently in Pain? No/denies   Pain Score 0-No pain   Multiple Pain Sites No                         [facility] Adult PT Treatment/Exercise - 04/14/17 1122       Knee/Hip Exercises: Aerobic   Nustep L2, 12 min     Knee/Hip Exercises: Standing   Heel Raises Both;15 reps   Hip Flexion Right;Left;15 reps;Stengthening;Knee bent   Hip Flexion Limitations 2#; at counter    Hip Abduction Right;Left;15 reps;Stengthening;Knee straight  2#    Abduction Limitations 2#; at counter    Hip Extension Right;Left;15 reps;Stengthening;Knee straight  2#    Extension Limitations 2#; at counter    Other Standing Knee Exercises Toe clears to 8" step x 15 reps each side; 1 UE support on RW                  PT Short Term Goals - 04/09/17 1145      PT SHORT TERM GOAL #2   Title walk for 4-5 min without fatigue to assist with community ambulation with rollator   Time 4   Period Weeks   Status On-going     PT SHORT TERM GOAL #3   Title stand x 6 min to assist with improved safe mobility   Time 4   Period Weeks   Status On-going     PT SHORT TERM GOAL #4   Title Perform 10 sit to  stands without rest break to assist with safe transfers at home   Baseline 5   Time 4   Period Weeks   Status On-going           PT Long Term Goals - 03/22/17 0955      PT LONG TERM GOAL #2   Title Improve Berg to >/= 40/56 for decreased falls risk   Time 8   Period Weeks   Status On-going     PT LONG TERM GOAL #3   Title Pt will be able to perform 8/12 on tinetti   Time 8   Status On-going     PT LONG TERM GOAL #4   Title Demo improved bil LE strength to 5/5 to assist with transfers across all venues   Time 8   Period Weeks   Status On-going     PT LONG TERM GOAL #5   Title Improve lumbar/core strength to good - to assist with longer duration ambulation in community   Time 8   Period Weeks   Status On-going     PT LONG TERM GOAL #6   Title Pt will be able to amb x 8-10 min with rollator MI without rest break to assist with community ambulation   Time 8   Period Weeks   Status On-going               Plan - 04/14/17 1116     Clinical Impression Statement Pt. noting blood sugar ~ 175 today shen she left the house.  Frequent rest breaks required today due to shortness of breath with therex.  Pt. able to tolerate mild progression in LE strengthening well today and reports she feels she is "getting stronger".     PT Treatment/Interventions Functional mobility training;Stair training;Gait training;DME Instruction;Therapeutic activities;Therapeutic exercise;Balance training;Neuromuscular re-education;Patient/family education;Energy conservation   PT Next Visit Plan LE strength, sit to stand endurance, standing and walking endurance, nu-step      Patient will benefit from skilled therapeutic intervention in order to improve the following deficits and impairments:  Abnormal gait, Decreased activity tolerance, Decreased balance, Decreased mobility, Decreased endurance, Decreased strength, Difficulty walking, Obesity  Visit Diagnosis: Abnormality of gait  Decreased strength, endurance, and mobility  Unsteadiness on feet     Problem List Patient Active Problem List   Diagnosis Date Noted  . Thrombocytopenia ([place]) 01/25/2017  . Chronic kidney disease (CKD) stage G4/A1, severely decreased glomerular filtration rate (GFR) between 15-29 mL/min/1.73 square meter and albuminuria creatinine ratio less than 30 mg/g ([facility]) 09/04/2015  . Chronic kidney disease (CKD), stage IV (severe) ([place]) 09/04/2015  . Hypoxia 08/24/2015  . Bronchitis 08/24/2015  . Acute respiratory failure with hypoxia ([place]) 08/24/2015  . Chronic kidney disease 08/24/2015  . Diabetes type 2, uncontrolled ([place]) 08/05/2010  . GOUT 06/23/2010  . DERMATITIS 01/16/2010  . BACK PAIN 06/13/2009  . MYALGIA 06/13/2009  . Anxiety state 12/13/2008  . LEG EDEMA, CHRONIC 11/08/2008  . Sleep apnea 10/26/2008  . Hypothyroidism 09/27/2008  . HYPERCHOLESTEROLEMIA 09/27/2008  . OBESITY 09/27/2008  . DEPRESSION 09/27/2008  . Essential hypertension 09/27/2008  .  ALLERGIC RHINITIS 09/27/2008  . ASTHMA, CHILDHOOD 09/27/2008    Bess Harvest, PTA 04/14/17 11:56 AM PHYSICAL THERAPY DISCHARGE SUMMARY  Visits from Start of Care: 6  Current functional level related to goals / functional outcomes: Pt didn't return after visit on 04/14/17.  See above for most current status.     Remaining deficits: See above.   Education / Equipment: HEP  Plan: Patient agrees to discharge.  Patient goals were partially met. Patient is being discharged due to not returning since the last visit.  ?????        Sigurd Sos, PT 12/13/17 11:29 AM  [facility] [address] [place], [place], [address] Phone: [phone]   Fax:  [phone]  Name: [name] MRN: [id number] Date of Birth: 1934/09/23

## 2017-04-15 DIAGNOSIS — M546 Pain in thoracic spine: Secondary | ICD-10-CM | POA: Diagnosis not present

## 2017-04-15 DIAGNOSIS — M9902 Segmental and somatic dysfunction of thoracic region: Secondary | ICD-10-CM | POA: Diagnosis not present

## 2017-04-15 DIAGNOSIS — M47814 Spondylosis without myelopathy or radiculopathy, thoracic region: Secondary | ICD-10-CM | POA: Diagnosis not present

## 2017-04-15 DIAGNOSIS — M5134 Other intervertebral disc degeneration, thoracic region: Secondary | ICD-10-CM | POA: Diagnosis not present

## 2017-04-16 ENCOUNTER — Ambulatory Visit: Payer: Medicare Other | Admitting: Physical Therapy

## 2017-04-17 ENCOUNTER — Encounter: Payer: Self-pay | Admitting: Adult Health

## 2017-04-20 ENCOUNTER — Ambulatory Visit (HOSPITAL_BASED_OUTPATIENT_CLINIC_OR_DEPARTMENT_OTHER): Payer: Medicare Other | Admitting: Family

## 2017-04-20 ENCOUNTER — Other Ambulatory Visit: Payer: Self-pay | Admitting: Family

## 2017-04-20 ENCOUNTER — Ambulatory Visit (HOSPITAL_COMMUNITY)
Admission: RE | Admit: 2017-04-20 | Discharge: 2017-04-20 | Disposition: A | Discharge status: Home / Self Care | Payer: Medicare Other | Source: Ambulatory Visit | Attending: Hematology & Oncology | Admitting: Hematology & Oncology

## 2017-04-20 ENCOUNTER — Other Ambulatory Visit (HOSPITAL_BASED_OUTPATIENT_CLINIC_OR_DEPARTMENT_OTHER): Payer: Medicare Other

## 2017-04-20 ENCOUNTER — Telehealth: Payer: Self-pay | Admitting: *Deleted

## 2017-04-20 ENCOUNTER — Other Ambulatory Visit: Payer: Self-pay | Admitting: *Deleted

## 2017-04-20 VITALS — BP 171/45 | HR 85 | Temp 98.8°F | Resp 18 | Wt 244.0 lb

## 2017-04-20 DIAGNOSIS — D696 Thrombocytopenia, unspecified: Secondary | ICD-10-CM

## 2017-04-20 DIAGNOSIS — R0602 Shortness of breath: Secondary | ICD-10-CM

## 2017-04-20 DIAGNOSIS — Z86718 Personal history of other venous thrombosis and embolism: Secondary | ICD-10-CM

## 2017-04-20 DIAGNOSIS — Z86711 Personal history of pulmonary embolism: Secondary | ICD-10-CM | POA: Diagnosis not present

## 2017-04-20 DIAGNOSIS — R5383 Other fatigue: Secondary | ICD-10-CM | POA: Diagnosis not present

## 2017-04-20 DIAGNOSIS — D649 Anemia, unspecified: Secondary | ICD-10-CM | POA: Insufficient documentation

## 2017-04-20 LAB — CMP (CANCER CENTER ONLY)
ALT(SGPT): 16 U/L (ref 10–47)
AST: 23 U/L (ref 11–38)
Albumin: 3.4 g/dL (ref 3.3–5.5)
Alkaline Phosphatase: 66 U/L (ref 26–84)
BUN, Bld: 35 mg/dL — ABNORMAL HIGH (ref 7–22)
CO2: 29 mEq/L (ref 18–33)
Calcium: 8.8 mg/dL (ref 8.0–10.3)
Chloride: 102 mEq/L (ref 98–108)
Creat: 2.1 mg/dl — ABNORMAL HIGH (ref 0.6–1.2)
Glucose, Bld: 318 mg/dL — ABNORMAL HIGH (ref 73–118)
Potassium: 4.6 mEq/L (ref 3.3–4.7)
Sodium: 144 mEq/L (ref 128–145)
Total Bilirubin: 0.9 mg/dl (ref 0.20–1.60)
Total Protein: 7.6 g/dL (ref 6.4–8.1)

## 2017-04-20 LAB — CBC WITH DIFFERENTIAL (CANCER CENTER ONLY)
BASO#: 0 10*3/uL (ref 0.0–0.2)
BASO%: 0.3 % (ref 0.0–2.0)
EOS%: 2.2 % (ref 0.0–7.0)
Eosinophils Absolute: 0.2 10*3/uL (ref 0.0–0.5)
HCT: 22 % — ABNORMAL LOW (ref 34.8–46.6)
HGB: 6.6 g/dL — CL (ref 11.6–15.9)
LYMPH#: 1.3 10*3/uL (ref 0.9–3.3)
LYMPH%: 17.2 % (ref 14.0–48.0)
MCH: 29.5 pg (ref 26.0–34.0)
MCHC: 30 g/dL — ABNORMAL LOW (ref 32.0–36.0)
MCV: 98 fL (ref 81–101)
MONO#: 0.5 10*3/uL (ref 0.1–0.9)
MONO%: 7 % (ref 0.0–13.0)
NEUT#: 5.6 10*3/uL (ref 1.5–6.5)
NEUT%: 73.3 % (ref 39.6–80.0)
Platelets: 42 10*3/uL — ABNORMAL LOW (ref 145–400)
RBC: 2.24 10*6/uL — ABNORMAL LOW (ref 3.70–5.32)
RDW: 19.9 % — ABNORMAL HIGH (ref 11.1–15.7)
WBC: 7.6 10*3/uL (ref 3.9–10.0)

## 2017-04-20 LAB — CHCC SATELLITE - SMEAR

## 2017-04-20 LAB — ABO/RH: ABO/RH(D): A NEG

## 2017-04-20 LAB — PREPARE RBC (CROSSMATCH)

## 2017-04-20 NOTE — Progress Notes (Addendum)
Hematology and Oncology Follow Up Visit  [name] [id number] 1935/02/15 82 y.o. 04/20/2017   Principle Diagnosis:  Thrombocytopenia History of PE  Current Therapy:   Eliquis 5 mg PO BID - stopped today 04/20/2017   Interim History:  [name] is here today for an unscheduled visit for fatigue, weakness and SOB with any exertion. Hgb is 6.6 with a platelet count of 42.  She is still on Eliquis 5 mg PO BID. Rectal exam was performed with Teodora as chaperone. Stool was positive for blood. She was instructed to stop her Eliquis today and verbalized understanding. After talking with her she states that her heart rate was normal. EKG in June showed normal sinus rhythm. Heart rate today 86 with normal rhythm on auscultation.  BUN and Creatinine are mildly elevated.  No fever, chills, n/v, cough, rash, dizziness, chest pain, palpitations, abdominal pain or changes in bowel or bladder habits.  No c/o neuropathy at this time. She has some mild puffiness in her feet and ankle that comes and goes.  She has poorly controlled diabetes on insulin. She states that she has an appointment with a new endocrinologist coming up soon.  Her appetite is down but she is staying well hydrated. Her weight is stable.   ECOG Performance Status: 2 - Symptomatic, <50% confined to bed  Medications:  Allergies as of 04/20/2017      Reactions   Metformin Diarrhea   Pineapple Swelling   Throat swells and blisters on tongue and roof of mouth per patient   Tetracycline Hives   Aspirin Itching, Rash   Contrast Media [iodinated Diagnostic Agents] Itching      Flagyl [metronidazole] Other (See Comments)   Unknown   Fluticasone-salmeterol Itching, Rash   Lactose Intolerance (gi) Other (See Comments)   gas   Latex Itching, Rash   Lisinopril Cough   Sulfonamide Derivatives Itching, Rash      Medication List       Accurate as of 04/20/17  2:09 PM. Always use your most recent med list.            apixaban 5 MG Tabs tablet Commonly known as:  ELIQUIS Take 1 tablet (5 mg total) by mouth 2 (two) times daily.   atorvastatin 80 MG tablet Commonly known as:  LIPITOR Take 1 tablet (80 mg total) by mouth daily.   BIOTIN PO Take 1 tablet by mouth daily.   Coenzyme Q10 200 MG capsule Take 200 mg by mouth daily.   docusate sodium 100 MG capsule Commonly known as:  COLACE Take 1 capsule by mouth daily.   fexofenadine 180 MG tablet Commonly known as:  ALLEGRA Take 180 mg by mouth daily.   furosemide 20 MG tablet Commonly known as:  LASIX TAKE 2 TABLETS BY MOUTH DAILY   glipiZIDE 5 MG tablet Commonly known as:  GLUCOTROL Take 1 tablet (5 mg total) by mouth 2 (two) times daily before a meal.   HUMULIN 70/30 (70-30) 100 UNIT/ML injection Generic drug:  insulin NPH-regular Human Inject 70 units under the skin in the morning, 15 units at noon and 65 units every evening.   Insulin Pen Needle 31G X 5 MM Misc Commonly known as:  B-D UF III MINI PEN NEEDLES Use daily for insulin injection   INSULIN SYRINGE 1CC/30GX5/16" 30G X 5/16" 1 ML Misc USE BID UTD   levothyroxine 112 MCG tablet Commonly known as:  SYNTHROID, LEVOTHROID Take 1 tablet (112 mcg total) by mouth daily.   multivitamin  tablet Take 1 tablet by mouth daily.   potassium citrate 10 MEQ (1080 MG) SR tablet Commonly known as:  UROCIT-K Take 20 mEq by mouth 2 (two) times daily.   potassium citrate 10 MEQ (1080 MG) SR tablet Commonly known as:  UROCIT-K Take by mouth. Take 3 tablets oral daily   PROBIOTIC PO Take 1 capsule by mouth daily.   ranitidine 150 MG tablet Commonly known as:  ZANTAC Take 150 mg by mouth daily as needed for heartburn.   sertraline 50 MG tablet Commonly known as:  ZOLOFT Take 1 tablet (50 mg total) by mouth daily.   vitamin C 250 MG tablet Commonly known as:  ASCORBIC ACID Take 250 mg by mouth daily.   Vitamin D3 2000 units Tabs Take 2,000 Units by mouth daily.        Allergies:  Allergies  Allergen Reactions  . Metformin Diarrhea  . Pineapple Swelling    Throat swells and blisters on tongue and roof of mouth per patient  . Tetracycline Hives  . Aspirin Itching and Rash  . Contrast Media [Iodinated Diagnostic Agents] Itching       . Flagyl [Metronidazole] Other (See Comments)    Unknown   . Fluticasone-Salmeterol Itching and Rash  . Lactose Intolerance (Gi) Other (See Comments)    gas  . Latex Itching and Rash  . Lisinopril Cough  . Sulfonamide Derivatives Itching and Rash    Past Medical History, Surgical history, Social history, and Family History were reviewed and updated.  Review of Systems: All other 10 point review of systems is negative.   Physical Exam:  vitals were not taken for this visit.  Wt Readings from Last 3 Encounters:  04/09/17 246 lb (111.6 kg)  03/23/17 240 lb 11.2 oz (109.2 kg)  03/03/17 240 lb 1.6 oz (108.9 kg)    Ocular: Sclerae unicteric, pupils equal, round and reactive to light Ear-nose-throat: Oropharynx clear, dentition fair Lymphatic: No cervical, supraclavicular or axillary adenopathy Lungs no rales or rhonchi, good excursion bilaterally Heart regular rate and rhythm, no murmur appreciated Abd soft, nontender, positive bowel sounds, no liver or spleen tip palpated on exam, no fluid wave  MSK no focal spinal tenderness, no joint edema Neuro: non-focal, well-oriented, appropriate affect Breasts: Deferred   Lab Results  Component Value Date   WBC 7.6 04/20/2017   HGB 6.6 (LL) 04/20/2017   HCT 22.0 (L) 04/20/2017   MCV 98 04/20/2017   PLT 42 Platelet count consistent in citrate (L) 04/20/2017   Lab Results  Component Value Date   FERRITIN 93 01/28/2011   IRON 68 01/28/2011   TIBC 363 01/28/2011   UIBC 295 01/28/2011   IRONPCTSAT 19 (L) 01/28/2011   Lab Results  Component Value Date   RETICCTPCT 1.9 01/28/2011   RBC 2.24 (L) 04/20/2017   RETICCTABS 73.3 01/28/2011   No results found  for: KPAFRELGTCHN, LAMBDASER, KAPLAMBRATIO No results found for: IGGSERUM, IGA, IGMSERUM No results found for: Ronnald Ramp, A1GS, A2GS, Violet Baldy, MSPIKE, SPEI   Chemistry      Component Value Date/Time   NA 144 04/20/2017 1323   NA 142 02/25/2017 1013   K 4.6 04/20/2017 1323   K 3.8 02/25/2017 1013   CL 102 04/20/2017 1323   CO2 29 04/20/2017 1323   CO2 26 02/25/2017 1013   BUN 35 (H) 04/20/2017 1323   BUN 20.1 02/25/2017 1013   CREATININE 2.1 (H) 04/20/2017 1323   CREATININE 1.8 (H) 02/25/2017 1013  Component Value Date/Time   CALCIUM 8.8 04/20/2017 1323   CALCIUM 8.7 02/25/2017 1013   ALKPHOS 66 04/20/2017 1323   ALKPHOS 65 02/25/2017 1013   AST 23 04/20/2017 1323   AST 16 02/25/2017 1013   ALT 16 04/20/2017 1323   ALT 13 02/25/2017 1013   BILITOT 0.90 04/20/2017 1323   BILITOT 0.76 02/25/2017 1013      Impression and Plan: Ms. Apel is a very pleasant 82 yo caucasian female with history of PE and thrombocytopenia. She has been taking Eliquis for history of PE and also because of medication induced atrial fib. She came in today with c/o fatigue, weakness and SOB with exertion. Hgb is down to 6.6 with platelet count of 42. Stool was also positive for blood.  EKG in June showed normal sinus rhythm. She will stop the Eliquis today.  She will come back in tomorrow for 2 units of blood.  We will plan to recheck her labs on Friday and see her back again in 4 weeks.  All questions were answered and she is in agreement with the plan. She will contact our office with any questions or concerns. We can certainly see her sooner if need be.   Eliezer Bottom, NP 8/14/20182:09 PM

## 2017-04-20 NOTE — Telephone Encounter (Signed)
Critical Value Hgb 6.6 Laverna Peace NP notified. No orders at this time.

## 2017-04-21 ENCOUNTER — Other Ambulatory Visit: Payer: Self-pay | Admitting: Family

## 2017-04-21 ENCOUNTER — Ambulatory Visit (HOSPITAL_BASED_OUTPATIENT_CLINIC_OR_DEPARTMENT_OTHER): Payer: Medicare Other

## 2017-04-21 VITALS — BP 147/51 | HR 68 | Temp 98.3°F | Resp 17

## 2017-04-21 DIAGNOSIS — D649 Anemia, unspecified: Secondary | ICD-10-CM

## 2017-04-21 DIAGNOSIS — D696 Thrombocytopenia, unspecified: Secondary | ICD-10-CM | POA: Diagnosis present

## 2017-04-21 MED ORDER — SODIUM CHLORIDE 0.9 % IV SOLN
250.0000 mL | Freq: Once | INTRAVENOUS | Status: AC
  Administered 2017-04-21: 250 mL via INTRAVENOUS

## 2017-04-21 MED ORDER — ACETAMINOPHEN 325 MG PO TABS
650.0000 mg | ORAL_TABLET | Freq: Once | ORAL | Status: AC
  Administered 2017-04-21: 650 mg via ORAL

## 2017-04-21 MED ORDER — ACETAMINOPHEN 325 MG PO TABS
ORAL_TABLET | ORAL | Status: AC
  Filled 2017-04-21: qty 2

## 2017-04-21 MED ORDER — DIPHENHYDRAMINE HCL 25 MG PO CAPS
25.0000 mg | ORAL_CAPSULE | Freq: Once | ORAL | Status: AC
  Administered 2017-04-21: 25 mg via ORAL

## 2017-04-21 MED ORDER — DIPHENHYDRAMINE HCL 25 MG PO CAPS
ORAL_CAPSULE | ORAL | Status: AC
  Filled 2017-04-21: qty 1

## 2017-04-21 MED ORDER — FUROSEMIDE 10 MG/ML IJ SOLN: 20.0000 mg | Freq: Once | INTRAMUSCULAR | Status: DC

## 2017-04-21 MED ORDER — ROMIPLOSTIM 250 MCG ~~LOC~~ SOLR
1.0000 ug/kg | Freq: Once | SUBCUTANEOUS | Status: AC
  Administered 2017-04-21: 110 ug via SUBCUTANEOUS
  Filled 2017-04-21: qty 0.22

## 2017-04-21 NOTE — Patient Instructions (Addendum)
Blood Transfusion, Adult, Care After This sheet gives you information about how to care for yourself after your procedure. Your health care provider may also give you more specific instructions. If you have problems or questions, contact your health care provider. What can I expect after the procedure? After your procedure, it is common to have:  Bruising and soreness where the IV tube was inserted.  Headache.  Follow these instructions at home:  Take over-the-counter and prescription medicines only as told by your health care provider.  Return to your normal activities as told by your health care provider.  Follow instructions from your health care provider about how to take care of your IV insertion site. Make sure you: ? Wash your hands with soap and water before you change your bandage (dressing). If soap and water are not available, use hand sanitizer. ? Change your dressing as told by your health care provider.  Check your IV insertion site every day for signs of infection. Check for: ? More redness, swelling, or pain. ? More fluid or blood. ? Warmth. ? Pus or a bad smell. Contact a health care provider if:  You have more redness, swelling, or pain around the IV insertion site.  You have more fluid or blood coming from the IV insertion site.  Your IV insertion site feels warm to the touch.  You have pus or a bad smell coming from the IV insertion site.  Your urine turns pink, red, or brown.  You feel weak after doing your normal activities. Get help right away if:  You have signs of a serious allergic or immune system reaction, including: ? Itchiness. ? Hives. ? Trouble breathing. ? Anxiety. ? Chest or lower back pain. ? Fever, flushing, and chills. ? Rapid pulse. ? Rash. ? Diarrhea. ? Vomiting. ? Dark urine. ? Serious headache. ? Dizziness. ? Stiff neck. ? Yellow coloration of the face or the white parts of the eyes (jaundice). This information is not  intended to replace advice given to you by your health care provider. Make sure you discuss any questions you have with your health care provider. Document Released: 09/14/2014 Document Revised: 04/22/2016 Document Reviewed: 03/09/2016 Elsevier Interactive Patient Education  2018 [employer].   Romiplostim injection What is this medicine? ROMIPLOSTIM (roe mi PLOE stim) helps your body make more platelets. This medicine is used to treat low platelets caused by chronic idiopathic thrombocytopenic purpura (ITP). This medicine may be used for other purposes; ask your health care provider or pharmacist if you have questions. COMMON BRAND NAME(S): Nplate What should I tell my health care provider before I take this medicine? They need to know if you have any of these conditions: -cancer or myelodysplastic syndrome -low blood counts, like low white cell, platelet, or red cell counts -take medicines that treat or prevent blood clots -an unusual or allergic reaction to romiplostim, mannitol, other medicines, foods, dyes, or preservatives -pregnant or trying to get pregnant -breast-feeding How should I use this medicine? This medicine is for injection under the skin. It is given by a health care professional in a hospital or clinic setting. A special MedGuide will be given to you before your injection. Read this information carefully each time. Talk to your pediatrician regarding the use of this medicine in children. Special care may be needed. Overdosage: If you think you have taken too much of this medicine contact a poison control center or emergency room at once. NOTE: This medicine is only for you. Do  not share this medicine with others. What if I miss a dose? It is important not to miss your dose. Call your doctor or health care professional if you are unable to keep an appointment. What may interact with this medicine? Interactions are not expected. This list may not describe all possible  interactions. Give your health care provider a list of all the medicines, herbs, non-prescription drugs, or dietary supplements you use. Also tell them if you smoke, drink alcohol, or use illegal drugs. Some items may interact with your medicine. What should I watch for while using this medicine? Your condition will be monitored carefully while you are receiving this medicine. Visit your prescriber or health care professional for regular checks on your progress and for the needed blood tests. It is important to keep all appointments. What side effects may I notice from receiving this medicine? Side effects that you should report to your doctor or health care professional as soon as possible: -allergic reactions like skin rash, itching or hives, swelling of the face, lips, or tongue -shortness of breath, chest pain, swelling in a leg -unusual bleeding or bruising Side effects that usually do not require medical attention (report to your doctor or health care professional if they continue or are bothersome): -dizziness -headache -muscle aches -pain in arms and legs -stomach pain -trouble sleeping This list may not describe all possible side effects. Call your doctor for medical advice about side effects. You may report side effects to FDA at 1-800-FDA-1088. Where should I keep my medicine? This drug is given in a hospital or clinic and will not be stored at home. NOTE: This sheet is a summary. It may not cover all possible information. If you have questions about this medicine, talk to your doctor, pharmacist, or health care provider.  2018 Elsevier/Gold Standard (2008-04-23 15:13:04)

## 2017-04-21 NOTE — Progress Notes (Signed)
Patient advised with MD approval to not take her 40mg  Lasix the next 2 mornings. She is coming here in 48 hours for labs and VS check.

## 2017-04-22 ENCOUNTER — Encounter: Payer: Self-pay | Admitting: Hematology & Oncology

## 2017-04-22 LAB — BPAM RBC
Blood Product Expiration Date: 201809052359
Blood Product Expiration Date: 201809052359
ISSUE DATE / TIME: 201808150928
ISSUE DATE / TIME: 201808150928
Unit Type and Rh: 600
Unit Type and Rh: 600

## 2017-04-22 LAB — TYPE AND SCREEN
ABO/RH(D): A NEG
Antibody Screen: NEGATIVE
Unit division: 0
Unit division: 0

## 2017-04-23 ENCOUNTER — Ambulatory Visit (HOSPITAL_BASED_OUTPATIENT_CLINIC_OR_DEPARTMENT_OTHER): Payer: Medicare Other

## 2017-04-23 ENCOUNTER — Other Ambulatory Visit: Payer: Self-pay | Admitting: Family

## 2017-04-23 ENCOUNTER — Other Ambulatory Visit: Payer: Self-pay | Admitting: Adult Health

## 2017-04-23 ENCOUNTER — Other Ambulatory Visit (HOSPITAL_BASED_OUTPATIENT_CLINIC_OR_DEPARTMENT_OTHER): Payer: Medicare Other

## 2017-04-23 ENCOUNTER — Ambulatory Visit: Payer: Medicare Other | Admitting: Physical Therapy

## 2017-04-23 VITALS — BP 143/39 | HR 59 | Resp 19

## 2017-04-23 DIAGNOSIS — N184 Chronic kidney disease, stage 4 (severe): Secondary | ICD-10-CM

## 2017-04-23 DIAGNOSIS — K760 Fatty (change of) liver, not elsewhere classified: Secondary | ICD-10-CM | POA: Diagnosis not present

## 2017-04-23 DIAGNOSIS — D649 Anemia, unspecified: Secondary | ICD-10-CM | POA: Diagnosis present

## 2017-04-23 DIAGNOSIS — K922 Gastrointestinal hemorrhage, unspecified: Secondary | ICD-10-CM

## 2017-04-23 DIAGNOSIS — D500 Iron deficiency anemia secondary to blood loss (chronic): Secondary | ICD-10-CM

## 2017-04-23 DIAGNOSIS — I4891 Unspecified atrial fibrillation: Secondary | ICD-10-CM | POA: Diagnosis not present

## 2017-04-23 DIAGNOSIS — D696 Thrombocytopenia, unspecified: Secondary | ICD-10-CM

## 2017-04-23 DIAGNOSIS — Z86711 Personal history of pulmonary embolism: Secondary | ICD-10-CM

## 2017-04-23 LAB — CBC WITH DIFFERENTIAL (CANCER CENTER ONLY)
BASO#: 0 10*3/uL (ref 0.0–0.2)
BASO%: 0.3 % (ref 0.0–2.0)
EOS%: 4.1 % (ref 0.0–7.0)
Eosinophils Absolute: 0.3 10*3/uL (ref 0.0–0.5)
HCT: 27.4 % — ABNORMAL LOW (ref 34.8–46.6)
HGB: 8.5 g/dL — ABNORMAL LOW (ref 11.6–15.9)
LYMPH#: 1.2 10*3/uL (ref 0.9–3.3)
LYMPH%: 19.6 % (ref 14.0–48.0)
MCH: 28.8 pg (ref 26.0–34.0)
MCHC: 31 g/dL — ABNORMAL LOW (ref 32.0–36.0)
MCV: 93 fL (ref 81–101)
MONO#: 0.5 10*3/uL (ref 0.1–0.9)
MONO%: 7.3 % (ref 0.0–13.0)
NEUT#: 4.4 10*3/uL (ref 1.5–6.5)
NEUT%: 68.7 % (ref 39.6–80.0)
Platelets: 58 10*3/uL — ABNORMAL LOW (ref 145–400)
RBC: 2.95 10*6/uL — ABNORMAL LOW (ref 3.70–5.32)
RDW: 20.5 % — ABNORMAL HIGH (ref 11.1–15.7)
WBC: 6.3 10*3/uL (ref 3.9–10.0)

## 2017-04-23 LAB — COMPREHENSIVE METABOLIC PANEL (CC13)
ALT: 12 IU/L (ref 0–32)
AST (SGOT): 14 IU/L (ref 0–40)
Albumin, Serum: 4 g/dL (ref 3.5–4.8)
Albumin/Globulin Ratio: 1.4 (ref 1.2–2.2)
Alkaline Phosphatase, S: 57 IU/L (ref 39–117)
BUN/Creatinine Ratio: 16 (ref 12–28)
BUN: 27 mg/dL (ref 8–27)
Bilirubin Total: 0.4 mg/dL (ref 0.0–1.2)
Calcium, Ser: 8.8 mg/dL (ref 8.7–10.3)
Carbon Dioxide, Total: 25 mmol/L (ref 20–29)
Chloride, Ser: 106 mmol/L (ref 96–106)
Creatinine, Ser: 1.67 mg/dL — ABNORMAL HIGH (ref 0.57–1.00)
GFR calc Af Amer: 33 mL/min/{1.73_m2} — ABNORMAL LOW (ref >59)
GFR calc non Af Amer: 29 mL/min/{1.73_m2} — ABNORMAL LOW (ref >59)
Globulin, Total: 2.8 g/dL (ref 1.5–4.5)
Glucose: 325 mg/dL — ABNORMAL HIGH (ref 65–99)
Potassium, Ser: 5.6 mmol/L — ABNORMAL HIGH (ref 3.5–5.2)
Sodium: 139 mmol/L (ref 134–144)
Total Protein: 6.8 g/dL (ref 6.0–8.5)

## 2017-04-23 MED ORDER — SODIUM CHLORIDE 0.9 % IV SOLN
Freq: Once | INTRAVENOUS | Status: AC
  Administered 2017-04-23: 16:00:00 via INTRAVENOUS

## 2017-04-23 MED ORDER — SODIUM CHLORIDE 0.9 % IV SOLN
510.0000 mg | Freq: Once | INTRAVENOUS | Status: AC
  Administered 2017-04-23: 510 mg via INTRAVENOUS
  Filled 2017-04-23: qty 17

## 2017-04-23 NOTE — Telephone Encounter (Signed)
Left a message for a return call.   Last filled for 1 year on 12/15/16 to [facility].  Need to see if pt wishes to change to [facility].

## 2017-04-23 NOTE — Progress Notes (Signed)
Referral to GI placed and Lab routed to Nephrologist Dr. Alfonso Ellis per her request.

## 2017-04-23 NOTE — Progress Notes (Signed)
[name] is still quite symptomatic with fatigue and SOB. Her CBC is improved but with her having had blood in her stool we will give her IV iron today. Hopefully, this will also help.

## 2017-04-23 NOTE — Patient Instructions (Signed)

## 2017-04-23 NOTE — Progress Notes (Signed)
OK to resume Lasix po today or tomorrow per sarah NP (she is aware of BP today). Patient reports feeling "full" after not taking it for 3 days so she agreed to resume and call her prescribing MD per Judson Roch NP.

## 2017-04-27 ENCOUNTER — Other Ambulatory Visit: Payer: Self-pay | Admitting: Adult Health

## 2017-04-27 DIAGNOSIS — E2839 Other primary ovarian failure: Secondary | ICD-10-CM

## 2017-04-27 DIAGNOSIS — Z1231 Encounter for screening mammogram for malignant neoplasm of breast: Secondary | ICD-10-CM

## 2017-04-28 ENCOUNTER — Encounter: Payer: Self-pay | Admitting: Internal Medicine

## 2017-04-28 ENCOUNTER — Ambulatory Visit (INDEPENDENT_AMBULATORY_CARE_PROVIDER_SITE_OTHER): Payer: Medicare Other | Admitting: Internal Medicine

## 2017-04-28 VITALS — BP 142/68 | HR 82 | Ht 61.0 in | Wt 245.0 lb

## 2017-04-28 DIAGNOSIS — Z794 Long term (current) use of insulin: Secondary | ICD-10-CM

## 2017-04-28 DIAGNOSIS — E1122 Type 2 diabetes mellitus with diabetic chronic kidney disease: Secondary | ICD-10-CM | POA: Diagnosis not present

## 2017-04-28 DIAGNOSIS — N184 Chronic kidney disease, stage 4 (severe): Secondary | ICD-10-CM | POA: Diagnosis not present

## 2017-04-28 NOTE — Telephone Encounter (Signed)
Sent to the pharmacy by e-scribe. 

## 2017-04-28 NOTE — Patient Instructions (Addendum)
Please continue: - Glipizide 10 mg 2x a day but move it 15-30 min BEFORE MEALS - Humulin 70-15-65 units but move it 15-30 min BEFORE MEALS  Please return in 1.5 months with your sugar log.   PATIENT INSTRUCTIONS FOR TYPE 2 DIABETES:  DIET AND EXERCISE Diet and exercise is an important part of diabetic treatment.  We recommended aerobic exercise in the form of brisk walking (working between 40-60% of maximal aerobic capacity, similar to brisk walking) for 150 minutes per week (such as 30 minutes five days per week) along with 3 times per week performing 'resistance' training (using various gauge rubber tubes with handles) 5-10 exercises involving the major muscle groups (upper body, lower body and core) performing 10-15 repetitions (or near fatigue) each exercise. Start at half the above goal but build slowly to reach the above goals. If limited by weight, joint pain, or disability, we recommend daily walking in a swimming pool with water up to waist to reduce pressure from joints while allow for adequate exercise.    BLOOD GLUCOSES Monitoring your blood glucoses is important for continued management of your diabetes. Please check your blood glucoses 2-4 times a day: fasting, before meals and at bedtime (you can rotate these measurements - e.g. one day check before the 3 meals, the next day check before 2 of the meals and before bedtime, etc.).   HYPOGLYCEMIA (low blood sugar) Hypoglycemia is usually a reaction to not eating, exercising, or taking too much insulin/ other diabetes drugs.  Symptoms include tremors, sweating, hunger, confusion, headache, etc. Treat IMMEDIATELY with 15 grams of Carbs: . 4 glucose tablets .  cup regular juice/soda . 2 tablespoons raisins . 4 teaspoons sugar . 1 tablespoon honey Recheck blood glucose in 15 mins and repeat above if still symptomatic/blood glucose <100.  RECOMMENDATIONS TO REDUCE YOUR RISK OF DIABETIC COMPLICATIONS: * Take your prescribed  MEDICATION(S) * Follow a DIABETIC diet: Complex carbs, fiber rich foods, (monounsaturated and polyunsaturated) fats * AVOID saturated/trans fats, high fat foods, >2,300 mg salt per day. * EXERCISE at least 5 times a week for 30 minutes or preferably daily.  * DO NOT SMOKE OR DRINK more than 1 drink a day. * Check your FEET every day. Do not wear tightfitting shoes. Contact us if you develop an ulcer * See your EYE doctor once a year or more if needed * Get a FLU shot once a year * Get a PNEUMONIA vaccine once before and once after age 42 years  GOALS:  * Your Hemoglobin A1c of <7%  * fasting sugars need to be <130 * after meals sugars need to be <180 (2h after you start eating) * Your Systolic BP should be 952 or lower  * Your Diastolic BP should be 80 or lower  * Your HDL (Good Cholesterol) should be 40 or higher  * Your LDL (Bad Cholesterol) should be 100 or lower. * Your Triglycerides should be 150 or lower  * Your Urine microalbumin (kidney function) should be <30 * Your Body Mass Index should be 25 or lower    Please consider the following ways to cut down carbs and fat and increase fiber and micronutrients in your diet: - substitute whole grain for white bread or pasta - substitute brown rice for white rice - substitute 90-calorie flat bread pieces for slices of bread when possible - substitute sweet potatoes or yams for white potatoes - substitute humus for margarine - substitute tofu for cheese when possible - substitute  almond or rice milk for regular milk (would not drink soy milk daily due to concern for soy estrogen influence on breast cancer risk) - substitute dark chocolate for other sweets when possible - substitute water - can add lemon or orange slices for taste - for diet sodas (artificial sweeteners will trick your body that you can eat sweets without getting calories and will lead you to overeating and weight gain in the long run) - do not skip breakfast or other  meals (this will slow down the metabolism and will result in more weight gain over time)  - can try smoothies made from fruit and almond/rice milk in am instead of regular breakfast - can also try old-fashioned (not instant) oatmeal made with almond/rice milk in am - order the dressing on the side when eating salad at a restaurant (pour less than half of the dressing on the salad) - eat as little meat as possible - can try juicing, but should not forget that juicing will get rid of the fiber, so would alternate with eating raw veg./fruits or drinking smoothies - use as little oil as possible, even when using olive oil - can dress a salad with a mix of balsamic vinegar and lemon juice, for e.g. - use agave nectar, stevia sugar, or regular sugar rather than artificial sweateners - steam or broil/roast veggies  - snack on veggies/fruit/nuts (unsalted, preferably) when possible, rather than processed foods - reduce or eliminate aspartame in diet (it is in diet sodas, chewing gum, etc) Read the labels!  Try to read Dr. Janene Harvey book: "Program for Reversing Diabetes" and Rip Esselstyn: "The Engine 2 Diet" for other ideas for healthy eating.

## 2017-04-28 NOTE — Progress Notes (Signed)
Patient ID: [name], female   DOB: [birth year], 82 y.o.   MRN: [id number]   HPI: [name] is a 82 y.o.-year-old female, referred by her PCP, [name], Sara Rumps, NP, for management of DM2, dx in late 1990s, insulin-dependent since 2012, uncontrolled, with complications (CKD stage 4, CHF). She prev. Saw Dr. Chalmers [name].  Last hemoglobin A1c was: Lab Results  Component Value Date   HGBA1C 7.0 02/17/2017   HGBA1C 7.0 (H) 12/15/2016   HGBA1C 7.6 03/17/2016   Pt is on a regimen of: - Glipizide 10 mg bid AFTER meals (increased recently) - Humulin 70/30 70-15 (forgets)-65 units AFTER meals   Pt checks her sugars 2x a day and they are: - am: 160-226, 267, 359 - 2h after b'fast: n/c - before lunch: n/c - 2h after lunch: n/c - before dinner: 170-267 - 2h after dinner: n/c - bedtime: n/c - nighttime: n/c No lows. Lowest sugar was 60; she has hypoglycemia awareness at 100.  Highest sugar was 500s x 1 lately.  Glucometer: True Metrix air  Pt's meals are: - Breakfast: grits, bacon, green tea + stevia - Lunch: 1/2 sandwich + salad + chips and drink - snack: fruit - Dinner:meat + veggies + occas. starch - Snacks: 2 - usually salty  + yoga and PT 2x a week  - + CKD, last BUN/creatinine:  Lab Results  Component Value Date   BUN 27 04/23/2017   BUN 35 (H) 04/20/2017   CREATININE 1.67 (H) 04/23/2017   CREATININE 2.1 (H) 04/20/2017   Lab Results  Component Value Date   GFRNONAA 29 (L) 04/23/2017   GFRNONAA 28 (L) 02/09/2017   GFRNONAA 28 (L) 02/02/2017   GFRNONAA 32 (L) 01/26/2017   GFRNONAA 19 (L) 10/14/2015   GFRNONAA 15 (L) 08/29/2015   GFRNONAA 19 (L) 08/28/2015   GFRNONAA 20 (L) 08/27/2015   GFRNONAA 20 (L) 08/26/2015   GFRNONAA 21 (L) 08/25/2015  He has a h/o uric acid kidney stones and also had surgeries. - last set of lipids: Lab Results  Component Value Date   CHOL 166 12/15/2016   HDL 30.50 (L) 12/15/2016   LDLCALC 65 08/16/2009   LDLDIRECT 54.0 12/15/2016   TRIG (H) 12/15/2016    513.0 Triglyceride is over 400; calculations on Lipids are invalid.   CHOLHDL 5 12/15/2016  On Atorvastatin, Fenofibrate..  - last eye exam was in 11/2015. No DR. She has this scheduled 05/31/2017. - + numbness and tingling in her toes.  Pt has FH of DM in PGM, mother after age 67.  She has urinary incontinence >> started Myrbetriq >> developed A fib >> started Eloquis >> had bleeding >> received a blood transfusion recently.  She has hypothyroidism - on LT4 112 mcg daily.  Recent TSH normal: Lab Results  Component Value Date   TSH 1.82 12/15/2016    ROS: Constitutional: no weight gain/loss, + fatigue, no subjective hyperthermia/hypothermia, + poor sleep, + nocturia Eyes: no blurry vision, no xerophthalmia ENT: no sore throat, no nodules palpated in throat, no dysphagia/odynophagia, no hoarseness Cardiovascular: no CP/+ SOB/+ palpitations/+ leg swelling Respiratory: no cough/+ SOB Gastrointestinal: no N/V/D/C Musculoskeletal: no muscle/joint aches Skin: no rashes, + hair loss Neurological: no tremors/numbness/tingling/dizziness Psychiatric: + depression/+ anxiety  Past Medical History:  Diagnosis Date  . Allergic rhinitis   . Anxiety state, unspecified    panic attacks  . CHF (congestive heart failure) ([place])   . Depressive disorder, not elsewhere classified   . Extrinsic asthma, unspecified  no problem since adulthood  . Obesity   . OSA on CPAP    severe  . Pure hypercholesterolemia   . Respiratory failure with hypoxia ([place]) 09/2008   acute, secondary to multiple bilateral pulmonary embolism , negative hypercoagulable workup 09/2008 hospital stay  . Scoliosis   . Syncope and collapse   . Type II or unspecified type diabetes mellitus without mention of complication, not stated as uncontrolled   . Unspecified essential hypertension   . Unspecified hypothyroidism    hypo   Past Surgical History:  Procedure Laterality Date  . EYE SURGERY    .  JOINT REPLACEMENT    . knee replaced    . THYROID SURGERY     Social History   Social History  . Marital status: Widowed    Spouse name: N/A  . Number of children: 2   Occupational History  . 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 Smoking status: Former Smoker    Years: 20.00    Quit date: 09/07/1989  . Smokeless tobacco: Never Used  . Alcohol use No  . Drug use: No   Current Outpatient Prescriptions on File Prior to Visit  Medication Sig Dispense Refill  . atorvastatin (LIPITOR) 80 MG tablet Take 1 tablet (80 mg total) by mouth daily. (Patient taking differently: Take 40 mg by mouth daily. ) 90 tablet 3  . BIOTIN PO Take 1 tablet by mouth daily.    . Cholecalciferol (VITAMIN D3) 2000 units TABS Take 2,000 Units by mouth daily.    . Coenzyme Q10 200 MG capsule Take 200 mg by mouth daily.      Marland Kitchen docusate sodium (COLACE) 100 MG capsule Take 1 capsule by mouth daily.    . fenofibrate 54 MG tablet TAKE 1 TABLET(54 MG) BY MOUTH DAILY 90 tablet 0  . fexofenadine (ALLEGRA) 180 MG tablet Take 180 mg by mouth daily.      . furosemide (LASIX) 20 MG tablet TAKE 2 TABLETS BY MOUTH DAILY 180 tablet 2  . insulin NPH-regular Human (HUMULIN 70/30) (70-30) 100 UNIT/ML injection Inject 70 units under the skin in the morning, 15 units at noon and 65 units every evening.    . Insulin Pen Needle (B-D UF III MINI PEN NEEDLES) 31G X 5 MM MISC Use daily for insulin injection 100 each 2  . Insulin Syringe-Needle U-100 (INSULIN SYRINGE 1CC/30GX5/16") 30G X 5/16" 1 ML MISC USE BID UTD  11  . levothyroxine (SYNTHROID, LEVOTHROID) 112 MCG tablet Take 1 tablet (112 mcg total) by mouth daily. 90 tablet 3  . Multiple Vitamin (MULTIVITAMIN) tablet Take 1 tablet by mouth daily.      . potassium citrate (UROCIT-K) 10 MEQ (1080 MG) SR tablet TAKE 1 TABLET BY MOUTH THREE TIMES DAILY WITH MEALS 90 tablet 0  . Probiotic Product (PROBIOTIC PO) Take 1 capsule by mouth  daily.    . ranitidine (ZANTAC) 150 MG tablet Take 150 mg by mouth daily as needed for heartburn.    . sertraline (ZOLOFT) 50 MG tablet Take 1 tablet (50 mg total) by mouth daily. 90 tablet 1  . vitamin C (ASCORBIC ACID) 250 MG tablet Take 250 mg by mouth daily.    Marland Kitchen glipiZIDE (GLUCOTROL) 5 MG tablet Take 1 tablet (5 mg total) by mouth 2 (two) times daily before a meal. (Patient taking differently: Take 10 mg by mouth 2 (two) times daily before a meal. Take 2  tablets 10 mg total two times daily - per patient.) 180 tablet 3   No current facility-administered medications on file prior to visit.    Allergies  Allergen Reactions  . Metformin Diarrhea  . Pineapple Swelling    Throat swells and blisters on tongue and roof of mouth per patient  . Tetracycline Hives  . Aspirin Itching and Rash  . Contrast Media [Iodinated Diagnostic Agents] Itching       . Flagyl [Metronidazole] Other (See Comments)    Unknown   . Fluticasone-Salmeterol Itching and Rash  . Lactose Intolerance (Gi) Other (See Comments)    gas  . Latex Itching and Rash  . Lisinopril Cough  . Sulfonamide Derivatives Itching and Rash   Family History  Problem Relation Age of Onset  . Allergies Mother   . Clotting disorder Mother   . Osteoarthritis Mother   . Asthma Mother   . Arthritis Other   . Diabetes Other   . Hyperlipidemia Other   . Hypertension Other   . Coronary artery disease Other   . Stroke Other   . Osteoarthritis Daughter   . Rheum arthritis Maternal Grandmother   . Clotting disorder Maternal Grandmother   . Clotting disorder Maternal Uncle   . Clotting disorder Daughter   . Allergies Daughter    PE: BP (!) 142/68 (BP Location: Left Arm, Patient Position: Sitting)   Pulse 82   Ht 5\' 1"  (1.549 m)   Wt 245 lb (111.1 kg)   SpO2 90%   BMI 46.29 kg/m  Wt Readings from Last 3 Encounters:  04/28/17 245 lb (111.1 kg)  04/20/17 244 lb (110.7 kg)  04/09/17 246 lb (111.6 kg)   Constitutional: obese,  in NAD Eyes: PERRLA, EOMI, no exophthalmos ENT: moist mucous membranes, no thyromegaly, no cervical lymphadenopathy Cardiovascular: RRR, No MRG Respiratory: CTA B Gastrointestinal: abdomen soft, NT, ND, BS+ Musculoskeletal: no deformities, strength intact in all 4 Skin: moist, warm, no rashes Neurological: no tremor with outstretched hands, DTR normal in all 4  ASSESSMENT: 1. DM2, insulin-dependent, uncontrolled, with complications - CKD stage 4 - CHF  PLAN:  1. Patient with long-standing, uncontrolled diabetes, on oral antidiabetic medication + premixed insulin regimen, which became insufficient. She is taking these after meals, which is conducive to large fluctuations in her sugars >> discussed to move both Glipizide and the premixed insulin 30 min before meals.  - her sugars are high throughout the day and we discussed how to change her diet to help with this >> she is interested in a plant-based diet >> I gave her references and also discussed about specific examples of healthier meals - I also advised her not to miss the lunchtime dose of insulin as her sugars before dinner are high - we will not make further changes for now, but I will have her back with her sugar log in 1.5 mo to reevaluate >> we can try to add a GLP1 R agonist then - I suggested to:  Patient Instructions  Please continue: - Glipizide 10 mg 2x a day but move it 15-30 min BEFORE MEALS - Humulin 70-15-65 units but move it 15-30 min BEFORE MEALS  Please return in 1.5 months with your sugar log.   - Strongly advised her to start checking sugars at different times of the day - check 2 times a day, rotating checks - given sugar log and advised how to fill it and to bring it at next appt  - given foot care handout  and explained the principles  - given instructions for hypoglycemia management "15-15 rule"  - advised for yearly eye exams  - Return to clinic in 1.5 mo with sugar log   Philemon Kingdom, MD PhD [facility]  Endocrinology

## 2017-04-29 ENCOUNTER — Other Ambulatory Visit: Payer: Medicare Other

## 2017-04-29 ENCOUNTER — Other Ambulatory Visit: Payer: Self-pay | Admitting: Gastroenterology

## 2017-04-29 ENCOUNTER — Ambulatory Visit: Payer: Medicare Other | Admitting: Family

## 2017-04-29 DIAGNOSIS — E1122 Type 2 diabetes mellitus with diabetic chronic kidney disease: Secondary | ICD-10-CM | POA: Diagnosis not present

## 2017-04-29 DIAGNOSIS — Z8679 Personal history of other diseases of the circulatory system: Secondary | ICD-10-CM | POA: Diagnosis not present

## 2017-04-29 DIAGNOSIS — K921 Melena: Secondary | ICD-10-CM | POA: Diagnosis not present

## 2017-04-29 DIAGNOSIS — Z794 Long term (current) use of insulin: Secondary | ICD-10-CM | POA: Diagnosis not present

## 2017-04-29 DIAGNOSIS — M545 Low back pain: Secondary | ICD-10-CM | POA: Diagnosis not present

## 2017-04-29 DIAGNOSIS — Z9989 Dependence on other enabling machines and devices: Secondary | ICD-10-CM | POA: Diagnosis not present

## 2017-04-29 DIAGNOSIS — G4733 Obstructive sleep apnea (adult) (pediatric): Secondary | ICD-10-CM | POA: Diagnosis not present

## 2017-04-29 DIAGNOSIS — Z6841 Body Mass Index (BMI) 40.0 and over, adult: Secondary | ICD-10-CM | POA: Diagnosis not present

## 2017-04-29 DIAGNOSIS — Z8709 Personal history of other diseases of the respiratory system: Secondary | ICD-10-CM | POA: Diagnosis not present

## 2017-04-29 DIAGNOSIS — G8929 Other chronic pain: Secondary | ICD-10-CM | POA: Diagnosis not present

## 2017-05-04 DIAGNOSIS — M546 Pain in thoracic spine: Secondary | ICD-10-CM | POA: Diagnosis not present

## 2017-05-04 DIAGNOSIS — M47814 Spondylosis without myelopathy or radiculopathy, thoracic region: Secondary | ICD-10-CM | POA: Diagnosis not present

## 2017-05-04 DIAGNOSIS — M9902 Segmental and somatic dysfunction of thoracic region: Secondary | ICD-10-CM | POA: Diagnosis not present

## 2017-05-04 DIAGNOSIS — M5134 Other intervertebral disc degeneration, thoracic region: Secondary | ICD-10-CM | POA: Diagnosis not present

## 2017-05-05 ENCOUNTER — Ambulatory Visit: Payer: Medicare Other | Admitting: Registered"

## 2017-05-06 ENCOUNTER — Other Ambulatory Visit: Payer: Self-pay | Admitting: Adult Health

## 2017-05-06 DIAGNOSIS — M9902 Segmental and somatic dysfunction of thoracic region: Secondary | ICD-10-CM | POA: Diagnosis not present

## 2017-05-06 DIAGNOSIS — M47814 Spondylosis without myelopathy or radiculopathy, thoracic region: Secondary | ICD-10-CM | POA: Diagnosis not present

## 2017-05-06 DIAGNOSIS — M5134 Other intervertebral disc degeneration, thoracic region: Secondary | ICD-10-CM | POA: Diagnosis not present

## 2017-05-06 DIAGNOSIS — M546 Pain in thoracic spine: Secondary | ICD-10-CM | POA: Diagnosis not present

## 2017-05-06 DIAGNOSIS — Z760 Encounter for issue of repeat prescription: Secondary | ICD-10-CM

## 2017-05-06 NOTE — Telephone Encounter (Signed)
Filled on 03/23/17 for 9 months.  Message sent to the pharmacy to check file.

## 2017-05-11 ENCOUNTER — Ambulatory Visit
Admission: RE | Admit: 2017-05-11 | Discharge: 2017-05-11 | Disposition: A | Discharge status: Home / Self Care | Payer: Medicare Other | Source: Ambulatory Visit | Attending: Adult Health | Admitting: Adult Health

## 2017-05-11 DIAGNOSIS — Z1231 Encounter for screening mammogram for malignant neoplasm of breast: Secondary | ICD-10-CM | POA: Diagnosis not present

## 2017-05-11 DIAGNOSIS — Z780 Asymptomatic menopausal state: Secondary | ICD-10-CM | POA: Diagnosis not present

## 2017-05-11 DIAGNOSIS — M85852 Other specified disorders of bone density and structure, left thigh: Secondary | ICD-10-CM | POA: Diagnosis not present

## 2017-05-11 DIAGNOSIS — E2839 Other primary ovarian failure: Secondary | ICD-10-CM

## 2017-05-11 LAB — HM MAMMOGRAPHY

## 2017-05-14 ENCOUNTER — Encounter: Payer: Self-pay | Admitting: Family Medicine

## 2017-05-18 ENCOUNTER — Ambulatory Visit (HOSPITAL_BASED_OUTPATIENT_CLINIC_OR_DEPARTMENT_OTHER): Payer: Medicare Other | Admitting: Family

## 2017-05-18 ENCOUNTER — Other Ambulatory Visit (HOSPITAL_BASED_OUTPATIENT_CLINIC_OR_DEPARTMENT_OTHER): Payer: Medicare Other

## 2017-05-18 VITALS — BP 144/49 | HR 74 | Temp 98.4°F | Resp 20 | Wt 246.0 lb

## 2017-05-18 DIAGNOSIS — D500 Iron deficiency anemia secondary to blood loss (chronic): Secondary | ICD-10-CM

## 2017-05-18 DIAGNOSIS — D649 Anemia, unspecified: Secondary | ICD-10-CM | POA: Insufficient documentation

## 2017-05-18 DIAGNOSIS — D696 Thrombocytopenia, unspecified: Secondary | ICD-10-CM

## 2017-05-18 DIAGNOSIS — Z86711 Personal history of pulmonary embolism: Secondary | ICD-10-CM

## 2017-05-18 DIAGNOSIS — K922 Gastrointestinal hemorrhage, unspecified: Secondary | ICD-10-CM | POA: Diagnosis not present

## 2017-05-18 LAB — CMP (CANCER CENTER ONLY)
ALT(SGPT): 22 U/L (ref 10–47)
AST: 28 U/L (ref 11–38)
Albumin: 3.7 g/dL (ref 3.3–5.5)
Alkaline Phosphatase: 53 U/L (ref 26–84)
BUN, Bld: 25 mg/dL — ABNORMAL HIGH (ref 7–22)
CO2: 31 mEq/L (ref 18–33)
Calcium: 9.6 mg/dL (ref 8.0–10.3)
Chloride: 98 mEq/L (ref 98–108)
Creat: 2.1 mg/dl — ABNORMAL HIGH (ref 0.6–1.2)
Glucose, Bld: 281 mg/dL — ABNORMAL HIGH (ref 73–118)
Potassium: 4.7 mEq/L (ref 3.3–4.7)
Sodium: 143 mEq/L (ref 128–145)
Total Bilirubin: 0.8 mg/dl (ref 0.20–1.60)
Total Protein: 7.7 g/dL (ref 6.4–8.1)

## 2017-05-18 LAB — IRON AND TIBC
%SAT: 23 % (ref 21–57)
Iron: 91 ug/dL (ref 41–142)
TIBC: 398 ug/dL (ref 236–444)
UIBC: 307 ug/dL (ref 120–384)

## 2017-05-18 LAB — CBC WITH DIFFERENTIAL (CANCER CENTER ONLY)
BASO#: 0 10*3/uL (ref 0.0–0.2)
BASO%: 0.4 % (ref 0.0–2.0)
EOS%: 3.1 % (ref 0.0–7.0)
Eosinophils Absolute: 0.2 10*3/uL (ref 0.0–0.5)
HCT: 30.2 % — ABNORMAL LOW (ref 34.8–46.6)
HGB: 9.9 g/dL — ABNORMAL LOW (ref 11.6–15.9)
LYMPH#: 1.1 10*3/uL (ref 0.9–3.3)
LYMPH%: 22.8 % (ref 14.0–48.0)
MCH: 32 pg (ref 26.0–34.0)
MCHC: 32.8 g/dL (ref 32.0–36.0)
MCV: 98 fL (ref 81–101)
MONO#: 0.3 10*3/uL (ref 0.1–0.9)
MONO%: 7 % (ref 0.0–13.0)
NEUT#: 3.2 10*3/uL (ref 1.5–6.5)
NEUT%: 66.7 % (ref 39.6–80.0)
Platelets: 39 10*3/uL — ABNORMAL LOW (ref 145–400)
RBC: 3.09 10*6/uL — ABNORMAL LOW (ref 3.70–5.32)
RDW: 22 % — ABNORMAL HIGH (ref 11.1–15.7)
WBC: 4.8 10*3/uL (ref 3.9–10.0)

## 2017-05-18 LAB — FERRITIN: Ferritin: 81 ng/ml (ref 9–269)

## 2017-05-18 NOTE — Progress Notes (Signed)
Hematology and Oncology Follow Up Visit  [name] 02-01-1935 82 y.o. 05/18/2017   Principle Diagnosis:  Thrombocytopenia History of PE  Current Therapy:   Eliquis 5 mg PO BID - stopped today 04/20/2017   Interim History:  [name] is here today for follow-up. She had a nice response to the 2 units of blood and iron infusion. Her Hgb is now up to 9.9 with an MCV of 98. Iron saturation is 23% and ferritin 81. She still has some occasional fatigue.  Her platelet count is still down at 39. We are now looking at possibly doing a bone marrow biopsy to try and determine a cause.  She is scheduled to have both and endoscopy and colonoscopy with Dr. Oletta Lamas on October 4th to further evaluate for cause of blood loss.  She follows up with cardiology on October 2nd.  No fever, chills, n/v, cough, rash, dizziness, SOB, chest pain, palpitations, abdominal pain or changes in bowel or bladder habits.  She has "puffiness" in her feet and ankles that comes and goes. This improves when she props up her feet. She denies tenderness, numbness or tingling in her extremities. No c/o pain at this time.  Her appetite has improved and she is staying well hydrated. She is a vegetarian and her diet is being followed closely by her new endocrinologist for the next 6 weeks. Her weight is stable.   ECOG Performance Status: 1 - Symptomatic but completely ambulatory  Medications:  Allergies as of 05/18/2017      Reactions   Metformin Diarrhea   Pineapple Swelling   Throat swells and blisters on tongue and roof of mouth per patient   Tetracycline Hives   Aspirin Itching, Rash   Contrast Media [iodinated Diagnostic Agents] Itching      Flagyl [metronidazole] Other (See Comments)   Unknown   Fluticasone-salmeterol Itching, Rash   Lactose Intolerance (gi) Other (See Comments)   gas   Latex Itching, Rash   Lisinopril Cough   Sulfonamide Derivatives Itching, Rash      Medication List         Accurate as of 05/18/17 11:30 AM. Always use your most recent med list.          atorvastatin 80 MG tablet Commonly known as:  LIPITOR Take 1 tablet (80 mg total) by mouth daily.   BIOTIN PO Take 1 tablet by mouth daily.   Coenzyme Q10 200 MG capsule Take 200 mg by mouth daily.   docusate sodium 100 MG capsule Commonly known as:  COLACE Take 1 capsule by mouth daily.   fenofibrate 54 MG tablet TAKE 1 TABLET(54 MG) BY MOUTH DAILY   fexofenadine 180 MG tablet Commonly known as:  ALLEGRA Take 180 mg by mouth daily.   furosemide 20 MG tablet Commonly known as:  LASIX TAKE 2 TABLETS BY MOUTH DAILY   glipiZIDE 5 MG tablet Commonly known as:  GLUCOTROL Take 1 tablet (5 mg total) by mouth 2 (two) times daily before a meal.   HUMULIN 70/30 (70-30) 100 UNIT/ML injection Generic drug:  insulin NPH-regular Human Inject 70 units under the skin in the morning, 15 units at noon and 65 units every evening.   Insulin Pen Needle 31G X 5 MM Misc Commonly known as:  B-D UF III MINI PEN NEEDLES Use daily for insulin injection   INSULIN SYRINGE 1CC/30GX5/16" 30G X 5/16" 1 ML Misc USE BID UTD   levothyroxine 112 MCG tablet Commonly known as:  SYNTHROID, LEVOTHROID  Take 1 tablet (112 mcg total) by mouth daily.   multivitamin tablet Take 1 tablet by mouth daily.   potassium citrate 10 MEQ (1080 MG) SR tablet Commonly known as:  UROCIT-K TAKE 1 TABLET BY MOUTH THREE TIMES DAILY WITH MEALS   PROBIOTIC PO Take 1 capsule by mouth daily.   ranitidine 150 MG tablet Commonly known as:  ZANTAC Take 150 mg by mouth daily as needed for heartburn.   sertraline 50 MG tablet Commonly known as:  ZOLOFT Take 1 tablet (50 mg total) by mouth daily.   vitamin C 250 MG tablet Commonly known as:  ASCORBIC ACID Take 250 mg by mouth daily.   Vitamin D3 2000 units Tabs Take 2,000 Units by mouth daily.       Allergies:  Allergies  Allergen Reactions  . Metformin Diarrhea  . Pineapple  Swelling    Throat swells and blisters on tongue and roof of mouth per patient  . Tetracycline Hives  . Aspirin Itching and Rash  . Contrast Media [Iodinated Diagnostic Agents] Itching       . Flagyl [Metronidazole] Other (See Comments)    Unknown   . Fluticasone-Salmeterol Itching and Rash  . Lactose Intolerance (Gi) Other (See Comments)    gas  . Latex Itching and Rash  . Lisinopril Cough  . Sulfonamide Derivatives Itching and Rash    Past Medical History, Surgical history, Social history, and Family History were reviewed and updated.  Review of Systems: All other 10 point review of systems is negative.   Physical Exam:  vitals were not taken for this visit.  Wt Readings from Last 3 Encounters:  04/28/17 245 lb (111.1 kg)  04/20/17 244 lb (110.7 kg)  04/09/17 246 lb (111.6 kg)    Ocular: Sclerae unicteric, pupils equal, round and reactive to light Ear-nose-throat: Oropharynx clear, dentition fair Lymphatic: No cervical, supraclavicular or axillary adenopathy Lungs no rales or rhonchi, good excursion bilaterally Heart regular rate and rhythm, no murmur appreciated Abd soft, nontender, positive bowel sounds, no liver or spleen tip palpated on exam, no fluid wave  MSK no focal spinal tenderness, no joint edema Neuro: non-focal, well-oriented, appropriate affect Breasts: Deferred   Lab Results  Component Value Date   WBC 4.8 05/18/2017   HGB 9.9 (L) 05/18/2017   HCT 30.2 (L) 05/18/2017   MCV 98 05/18/2017   PLT 39 Platelet count consistent in citrate (L) 05/18/2017   Lab Results  Component Value Date   FERRITIN 93 01/28/2011   IRON 68 01/28/2011   TIBC 363 01/28/2011   UIBC 295 01/28/2011   IRONPCTSAT 19 (L) 01/28/2011   Lab Results  Component Value Date   RETICCTPCT 1.9 01/28/2011   RBC 3.09 (L) 05/18/2017   RETICCTABS 73.3 01/28/2011   No results found for: KPAFRELGTCHN, LAMBDASER, KAPLAMBRATIO No results found for: IGGSERUM, IGA, IGMSERUM No results  found for: Ronnald Ramp, A1GS, A2GS, Tillman Sers, SPEI   Chemistry      Component Value Date/Time   NA 139 04/23/2017 1336   NA 144 04/20/2017 1323   NA 142 02/25/2017 1013   K 5.6 (H) 04/23/2017 1336   K 4.6 04/20/2017 1323   K 3.8 02/25/2017 1013   CL 106 04/23/2017 1336   CL 102 04/20/2017 1323   CO2 25 04/23/2017 1336   CO2 29 04/20/2017 1323   CO2 26 02/25/2017 1013   BUN 27 04/23/2017 1336   BUN 35 (H) 04/20/2017 1323   BUN 20.1 02/25/2017 1013  CREATININE 1.67 (H) 04/23/2017 1336   CREATININE 2.1 (H) 04/20/2017 1323   CREATININE 1.8 (H) 02/25/2017 1013      Component Value Date/Time   CALCIUM 8.8 04/23/2017 1336   CALCIUM 8.8 04/20/2017 1323   CALCIUM 8.7 02/25/2017 1013   ALKPHOS 57 04/23/2017 1336   ALKPHOS 66 04/20/2017 1323   ALKPHOS 65 02/25/2017 1013   AST 14 04/23/2017 1336   AST 23 04/20/2017 1323   AST 16 02/25/2017 1013   ALT 12 04/23/2017 1336   ALT 16 04/20/2017 1323   ALT 13 02/25/2017 1013   BILITOT 0.4 04/23/2017 1336   BILITOT 0.90 04/20/2017 1323   BILITOT 0.76 02/25/2017 1013      Impression and Plan: [name] is a very pleasant 82 yo caucasian female with history of PE, thrombocytopenia and anemia secondary to GI blood loss.  She has had no other episodes of bleeding and has responded nicely to the IV iron and blood she received 3 weeks ago.  Hgb is now up to 9.9 with an MCV of 98. Her iron studies have also improved.  She will be having an endoscopy and colonoscopy on October 4th.  We will see her back for follow-up and labs in 2 weeks. Per Dr. Marin Olp, if her platelet count has not improved at that time we will move forward with a bone marrow biopsy.  She promises to contact our office with any questions or concerns. We can certainly see her sooner if need be.   Eliezer Bottom, NP 9/11/201811:30 AM

## 2017-05-19 LAB — RETICULOCYTES: Reticulocyte Count: 5.6 % — ABNORMAL HIGH (ref 0.6–2.6)

## 2017-05-26 DIAGNOSIS — N200 Calculus of kidney: Secondary | ICD-10-CM | POA: Diagnosis not present

## 2017-05-28 ENCOUNTER — Encounter: Payer: Self-pay | Admitting: Adult Health

## 2017-05-31 DIAGNOSIS — E113293 Type 2 diabetes mellitus with mild nonproliferative diabetic retinopathy without macular edema, bilateral: Secondary | ICD-10-CM | POA: Diagnosis not present

## 2017-05-31 DIAGNOSIS — M9902 Segmental and somatic dysfunction of thoracic region: Secondary | ICD-10-CM | POA: Diagnosis not present

## 2017-05-31 DIAGNOSIS — M47814 Spondylosis without myelopathy or radiculopathy, thoracic region: Secondary | ICD-10-CM | POA: Diagnosis not present

## 2017-05-31 DIAGNOSIS — H5201 Hypermetropia, right eye: Secondary | ICD-10-CM | POA: Diagnosis not present

## 2017-05-31 DIAGNOSIS — M546 Pain in thoracic spine: Secondary | ICD-10-CM | POA: Diagnosis not present

## 2017-05-31 DIAGNOSIS — H33192 Other retinoschisis and retinal cysts, left eye: Secondary | ICD-10-CM | POA: Diagnosis not present

## 2017-05-31 DIAGNOSIS — M5134 Other intervertebral disc degeneration, thoracic region: Secondary | ICD-10-CM | POA: Diagnosis not present

## 2017-05-31 DIAGNOSIS — Z961 Presence of intraocular lens: Secondary | ICD-10-CM | POA: Diagnosis not present

## 2017-05-31 LAB — HM DIABETES EYE EXAM

## 2017-06-01 ENCOUNTER — Ambulatory Visit (HOSPITAL_BASED_OUTPATIENT_CLINIC_OR_DEPARTMENT_OTHER): Payer: Medicare Other | Admitting: Family

## 2017-06-01 ENCOUNTER — Ambulatory Visit (HOSPITAL_BASED_OUTPATIENT_CLINIC_OR_DEPARTMENT_OTHER): Payer: Medicare Other

## 2017-06-01 ENCOUNTER — Other Ambulatory Visit (HOSPITAL_BASED_OUTPATIENT_CLINIC_OR_DEPARTMENT_OTHER): Payer: Medicare Other

## 2017-06-01 VITALS — BP 151/68 | HR 86 | Temp 97.7°F | Resp 20 | Wt 246.0 lb

## 2017-06-01 DIAGNOSIS — D500 Iron deficiency anemia secondary to blood loss (chronic): Secondary | ICD-10-CM | POA: Diagnosis not present

## 2017-06-01 DIAGNOSIS — Z86711 Personal history of pulmonary embolism: Secondary | ICD-10-CM | POA: Diagnosis not present

## 2017-06-01 DIAGNOSIS — E119 Type 2 diabetes mellitus without complications: Secondary | ICD-10-CM | POA: Diagnosis not present

## 2017-06-01 DIAGNOSIS — K760 Fatty (change of) liver, not elsewhere classified: Secondary | ICD-10-CM | POA: Diagnosis not present

## 2017-06-01 DIAGNOSIS — D696 Thrombocytopenia, unspecified: Secondary | ICD-10-CM

## 2017-06-01 DIAGNOSIS — K922 Gastrointestinal hemorrhage, unspecified: Secondary | ICD-10-CM

## 2017-06-01 DIAGNOSIS — N184 Chronic kidney disease, stage 4 (severe): Secondary | ICD-10-CM

## 2017-06-01 DIAGNOSIS — D649 Anemia, unspecified: Secondary | ICD-10-CM

## 2017-06-01 LAB — CMP (CANCER CENTER ONLY)
ALT(SGPT): 29 U/L (ref 10–47)
AST: 31 U/L (ref 11–38)
Albumin: 3.6 g/dL (ref 3.3–5.5)
Alkaline Phosphatase: 47 U/L (ref 26–84)
BUN, Bld: 39 mg/dL — ABNORMAL HIGH (ref 7–22)
CO2: 30 mEq/L (ref 18–33)
Calcium: 9.7 mg/dL (ref 8.0–10.3)
Chloride: 97 mEq/L — ABNORMAL LOW (ref 98–108)
Creat: 2.4 mg/dl — ABNORMAL HIGH (ref 0.6–1.2)
Glucose, Bld: 264 mg/dL — ABNORMAL HIGH (ref 73–118)
Potassium: 4.7 mEq/L (ref 3.3–4.7)
Sodium: 141 mEq/L (ref 128–145)
Total Bilirubin: 1 mg/dl (ref 0.20–1.60)
Total Protein: 7.4 g/dL (ref 6.4–8.1)

## 2017-06-01 LAB — IRON AND TIBC
%SAT: 19 % — ABNORMAL LOW (ref 21–57)
Iron: 83 ug/dL (ref 41–142)
TIBC: 443 ug/dL (ref 236–444)
UIBC: 360 ug/dL (ref 120–384)

## 2017-06-01 LAB — CBC WITH DIFFERENTIAL (CANCER CENTER ONLY)
BASO#: 0 10*3/uL (ref 0.0–0.2)
BASO%: 0.4 % (ref 0.0–2.0)
EOS%: 3.6 % (ref 0.0–7.0)
Eosinophils Absolute: 0.2 10*3/uL (ref 0.0–0.5)
HCT: 30.9 % — ABNORMAL LOW (ref 34.8–46.6)
HGB: 10.3 g/dL — ABNORMAL LOW (ref 11.6–15.9)
LYMPH#: 1.4 10*3/uL (ref 0.9–3.3)
LYMPH%: 25.5 % (ref 14.0–48.0)
MCH: 32.1 pg (ref 26.0–34.0)
MCHC: 33.3 g/dL (ref 32.0–36.0)
MCV: 96 fL (ref 81–101)
MONO#: 0.3 10*3/uL (ref 0.1–0.9)
MONO%: 6.4 % (ref 0.0–13.0)
NEUT#: 3.4 10*3/uL (ref 1.5–6.5)
NEUT%: 64.1 % (ref 39.6–80.0)
Platelets: 29 10*3/uL — ABNORMAL LOW (ref 145–400)
RBC: 3.21 10*6/uL — ABNORMAL LOW (ref 3.70–5.32)
RDW: 20.6 % — ABNORMAL HIGH (ref 11.1–15.7)
WBC: 5.3 10*3/uL (ref 3.9–10.0)

## 2017-06-01 LAB — FERRITIN: Ferritin: 55 ng/ml (ref 9–269)

## 2017-06-01 LAB — CHCC SATELLITE - SMEAR

## 2017-06-01 MED ORDER — ROMIPLOSTIM 250 MCG ~~LOC~~ SOLR
1.0000 ug/kg | Freq: Once | SUBCUTANEOUS | Status: AC
  Administered 2017-06-01: 110 ug via SUBCUTANEOUS
  Filled 2017-06-01: qty 0.22

## 2017-06-01 NOTE — Patient Instructions (Signed)
Romiplostim injection What is this medicine? ROMIPLOSTIM (roe mi PLOE stim) helps your body make more platelets. This medicine is used to treat low platelets caused by chronic idiopathic thrombocytopenic purpura (ITP). This medicine may be used for other purposes; ask your health care provider or pharmacist if you have questions. COMMON BRAND NAME(S): Nplate What should I tell my health care provider before I take this medicine? They need to know if you have any of these conditions: -cancer or myelodysplastic syndrome -low blood counts, like low white cell, platelet, or red cell counts -take medicines that treat or prevent blood clots -an unusual or allergic reaction to romiplostim, mannitol, other medicines, foods, dyes, or preservatives -pregnant or trying to get pregnant -breast-feeding How should I use this medicine? This medicine is for injection under the skin. It is given by a health care professional in a hospital or clinic setting. A special MedGuide will be given to you before your injection. Read this information carefully each time. Talk to your pediatrician regarding the use of this medicine in children. Special care may be needed. Overdosage: If you think you have taken too much of this medicine contact a poison control center or emergency room at once. NOTE: This medicine is only for you. Do not share this medicine with others. What if I miss a dose? It is important not to miss your dose. Call your doctor or health care professional if you are unable to keep an appointment. What may interact with this medicine? Interactions are not expected. This list may not describe all possible interactions. Give your health care provider a list of all the medicines, herbs, non-prescription drugs, or dietary supplements you use. Also tell them if you smoke, drink alcohol, or use illegal drugs. Some items may interact with your medicine. What should I watch for while using this  medicine? Your condition will be monitored carefully while you are receiving this medicine. Visit your prescriber or health care professional for regular checks on your progress and for the needed blood tests. It is important to keep all appointments. What side effects may I notice from receiving this medicine? Side effects that you should report to your doctor or health care professional as soon as possible: -allergic reactions like skin rash, itching or hives, swelling of the face, lips, or tongue -shortness of breath, chest pain, swelling in a leg -unusual bleeding or bruising Side effects that usually do not require medical attention (report to your doctor or health care professional if they continue or are bothersome): -dizziness -headache -muscle aches -pain in arms and legs -stomach pain -trouble sleeping This list may not describe all possible side effects. Call your doctor for medical advice about side effects. You may report side effects to FDA at 1-800-FDA-1088. Where should I keep my medicine? This drug is given in a hospital or clinic and will not be stored at home. NOTE: This sheet is a summary. It may not cover all possible information. If you have questions about this medicine, talk to your doctor, pharmacist, or health care provider.  2018 Elsevier/Gold Standard (2008-04-23 15:13:04)  

## 2017-06-01 NOTE — Progress Notes (Signed)
Hematology and Oncology Follow Up Visit  [name] [id number] November 12, 1934 82 y.o. 06/01/2017   Principle Diagnosis:  Thrombocytopenia History of PE  Current Therapy:   Nplate as indicated    Interim History:  [name] is here today for follow-up. She is doing well and has no new complaints at this time. She states that her energy is much improved. Her Hgb is now 10.3 with an MCV of 96.  Her platelet count is down to 29. Thankfully, she has had no episodes of bleeding, no petechiae and no bruising in excess.  She has poorly controlled diabetes and she is seeing a new endocrinologist to help better manage this.  No fever, chills, n/v, cough, rash, dizziness, SOB, chest pain, palpitations, abdominal pain or changes in bowel or bladder habits.  The puffiness in her feet and ankles has significantly improved. She has not noticed and numbness or tingling in her extremities. No c/o pain at this time.  She has a good appetite and is staying well hydrated. Her weight is stable.   ECOG Performance Status: 1 - Symptomatic but completely ambulatory  Medications:  Allergies as of 06/01/2017      Reactions   Metformin Diarrhea   Pineapple Swelling   Throat swells and blisters on tongue and roof of mouth per patient   Tetracycline Hives   Aspirin Itching, Rash   Contrast Media [iodinated Diagnostic Agents] Itching      Flagyl [metronidazole] Other (See Comments)   Unknown   Fluticasone-salmeterol Itching, Rash   Lactose Intolerance (gi) Other (See Comments)   gas   Latex Itching, Rash   Lisinopril Cough   Sulfonamide Derivatives Itching, Rash      Medication List       Accurate as of 06/01/17 11:51 AM. Always use your most recent med list.          atorvastatin 80 MG tablet Commonly known as:  LIPITOR Take 1 tablet (80 mg total) by mouth daily.   BIOTIN PO Take 1 tablet by mouth daily.   Coenzyme Q10 200 MG capsule Take 200 mg by mouth daily.   docusate sodium 100  MG capsule Commonly known as:  COLACE Take 1 capsule by mouth daily.   fenofibrate 54 MG tablet TAKE 1 TABLET(54 MG) BY MOUTH DAILY   fexofenadine 180 MG tablet Commonly known as:  ALLEGRA Take 180 mg by mouth daily.   furosemide 20 MG tablet Commonly known as:  LASIX TAKE 2 TABLETS BY MOUTH DAILY   glipiZIDE 5 MG tablet Commonly known as:  GLUCOTROL Take 1 tablet (5 mg total) by mouth 2 (two) times daily before a meal.   HUMULIN 70/30 (70-30) 100 UNIT/ML injection Generic drug:  insulin NPH-regular Human Inject 70 units under the skin in the morning, 15 units at noon and 65 units every evening.   Insulin Pen Needle 31G X 5 MM Misc Commonly known as:  B-D UF III MINI PEN NEEDLES Use daily for insulin injection   INSULIN SYRINGE 1CC/30GX5/16" 30G X 5/16" 1 ML Misc USE BID UTD   levothyroxine 112 MCG tablet Commonly known as:  SYNTHROID, LEVOTHROID Take 1 tablet (112 mcg total) by mouth daily.   multivitamin tablet Take 1 tablet by mouth daily.   potassium citrate 10 MEQ (1080 MG) SR tablet Commonly known as:  UROCIT-K TAKE 1 TABLET BY MOUTH THREE TIMES DAILY WITH MEALS   PROBIOTIC PO Take 1 capsule by mouth daily.   ranitidine 150 MG tablet Commonly known  as:  ZANTAC Take 150 mg by mouth daily as needed for heartburn.   sertraline 50 MG tablet Commonly known as:  ZOLOFT Take 1 tablet (50 mg total) by mouth daily.   vitamin C 250 MG tablet Commonly known as:  ASCORBIC ACID Take 250 mg by mouth daily.   Vitamin D3 2000 units Tabs Take 2,000 Units by mouth daily.       Allergies:  Allergies  Allergen Reactions  . Metformin Diarrhea  . Pineapple Swelling    Throat swells and blisters on tongue and roof of mouth per patient  . Tetracycline Hives  . Aspirin Itching and Rash  . Contrast Media [Iodinated Diagnostic Agents] Itching       . Flagyl [Metronidazole] Other (See Comments)    Unknown   . Fluticasone-Salmeterol Itching and Rash  . Lactose  Intolerance (Gi) Other (See Comments)    gas  . Latex Itching and Rash  . Lisinopril Cough  . Sulfonamide Derivatives Itching and Rash    Past Medical History, Surgical history, Social history, and Family History were reviewed and updated.  Review of Systems: All other 10 point review of systems is negative.   Physical Exam:  vitals were not taken for this visit.  Wt Readings from Last 3 Encounters:  05/18/17 246 lb (111.6 kg)  04/28/17 245 lb (111.1 kg)  04/20/17 244 lb (110.7 kg)    Ocular: Sclerae unicteric, pupils equal, round and reactive to light Ear-nose-throat: Oropharynx clear, dentition fair Lymphatic: No cervical, supraclavicular or axillary adenopathy Lungs no rales or rhonchi, good excursion bilaterally Heart regular rate and rhythm, no murmur appreciated Abd soft, nontender, positive bowel sounds, no liver or spleen tip palpated on exam, no fluid wave MSK no focal spinal tenderness, no joint edema Neuro: non-focal, well-oriented, appropriate affect Breasts: Deferred   Lab Results  Component Value Date   WBC 5.3 06/01/2017   HGB 10.3 (L) 06/01/2017   HCT 30.9 (L) 06/01/2017   MCV 96 06/01/2017   PLT 29 Platelet count consistent in citrate (L) 06/01/2017   Lab Results  Component Value Date   FERRITIN 81 05/18/2017   IRON 91 05/18/2017   TIBC 398 05/18/2017   UIBC 307 05/18/2017   IRONPCTSAT 23 05/18/2017   Lab Results  Component Value Date   RETICCTPCT 1.9 01/28/2011   RBC 3.21 (L) 06/01/2017   RETICCTABS 73.3 01/28/2011   No results found for: KPAFRELGTCHN, LAMBDASER, KAPLAMBRATIO No results found for: IGGSERUM, IGA, IGMSERUM No results found for: Odetta Pink, SPEI   Chemistry      Component Value Date/Time   NA 141 06/01/2017 1120   NA 142 02/25/2017 1013   K 4.7 06/01/2017 1120   K 3.8 02/25/2017 1013   CL 97 (L) 06/01/2017 1120   CO2 30 06/01/2017 1120   CO2 26 02/25/2017 1013    BUN 39 (H) 06/01/2017 1120   BUN 20.1 02/25/2017 1013   CREATININE 2.4 (H) 06/01/2017 1120   CREATININE 1.8 (H) 02/25/2017 1013      Component Value Date/Time   CALCIUM 9.7 06/01/2017 1120   CALCIUM 8.7 02/25/2017 1013   ALKPHOS 47 06/01/2017 1120   ALKPHOS 65 02/25/2017 1013   AST 31 06/01/2017 1120   AST 16 02/25/2017 1013   ALT 29 06/01/2017 1120   ALT 13 02/25/2017 1013   BILITOT 1.00 06/01/2017 1120   BILITOT 0.76 02/25/2017 1013      Impression and Plan: Ms. Arbaugh is a  very pleasant 82 yo caucasian female with history of PE, anemia secondary to GI blood loss on Eliquis and thrombocytopenia. Hgb is stable at 10.3 with an MCV of 96. She has not noted any bleeding since her last visit and will be having and endoscopy and colonoscopy next week.  She also has an appointment with endocrinology next week for management of her diabetes.  She will received IV iron later this week for iron saturation if 19%.  Platelet count today is 29 so we will proceed with Nplate today and have her come back weekly for lab and injection for the next 4 weeks. We will see her back again for follow-up in 4 weeks.  We will also get a CT scan to better evaluate her liver for any changes.  She will contact our office with any questions or concerns and will go to the ED in the event of an emergency. We can certainly see her sooner if need be.   Eliezer Bottom, NP 9/25/201811:51 AM

## 2017-06-02 ENCOUNTER — Telehealth: Payer: Self-pay | Admitting: Internal Medicine

## 2017-06-02 ENCOUNTER — Encounter: Payer: Self-pay | Admitting: Adult Health

## 2017-06-02 ENCOUNTER — Ambulatory Visit (INDEPENDENT_AMBULATORY_CARE_PROVIDER_SITE_OTHER): Payer: Medicare Other | Admitting: Adult Health

## 2017-06-02 VITALS — BP 146/66 | Temp 98.2°F | Ht 61.0 in | Wt 245.0 lb

## 2017-06-02 DIAGNOSIS — Z794 Long term (current) use of insulin: Secondary | ICD-10-CM

## 2017-06-02 DIAGNOSIS — E1122 Type 2 diabetes mellitus with diabetic chronic kidney disease: Secondary | ICD-10-CM | POA: Diagnosis not present

## 2017-06-02 DIAGNOSIS — N184 Chronic kidney disease, stage 4 (severe): Secondary | ICD-10-CM | POA: Diagnosis not present

## 2017-06-02 LAB — RETICULOCYTES: Reticulocyte Count: 5.8 % — ABNORMAL HIGH (ref 0.6–2.6)

## 2017-06-02 LAB — POCT GLYCOSYLATED HEMOGLOBIN (HGB A1C): Hemoglobin A1C: 6.2

## 2017-06-02 NOTE — Progress Notes (Signed)
Subjective:    Patient ID: [name], female    DOB: February 11, 1935, 82 y.o.   MRN: [id number]  HPI  82 year old female who  has a past medical history of Allergic rhinitis; Anxiety state, unspecified; CHF (congestive heart failure) ([place]); Depressive disorder, not elsewhere classified; Extrinsic asthma, unspecified; Obesity; OSA on CPAP; Pure hypercholesterolemia; Respiratory failure with hypoxia ([place]) (09/2008); Scoliosis; Syncope and collapse; Type II or unspecified type diabetes mellitus without mention of complication, not stated as uncontrolled; Unspecified essential hypertension; and Unspecified hypothyroidism.   She presents to the office today for three month follow up regarding diabetes. Her last A1c was 7.0 in June 2018.   She was seen in August for increasing glucose readings over a few week period prior to the visit. She reported blood sugar readings at home in excess of 500. At this time her blood sugar in the office was 350. I had her increase glipizide to 10 mg. She was referred to Endocrinology, which she has followed up with Dr. Cruzita Lederer and has a follow up appointment with her on October 4th.   Her blood sugar log shows blood sugar readings between 120-300's. She has been doing a plant based diet as recommended by Dr. Cruzita Lederer.   Unfortunately her platelets are low ( 29) and she is been receiving injections by [facility]  Review of Systems See HPI   Past Medical History:  Diagnosis Date  . Allergic rhinitis   . Anxiety state, unspecified    panic attacks  . CHF (congestive heart failure) ([place])   . Depressive disorder, not elsewhere classified   . Extrinsic asthma, unspecified    no problem since adulthood  . Obesity   . OSA on CPAP    severe  . Pure hypercholesterolemia   . Respiratory failure with hypoxia ([place]) 09/2008   acute, secondary to multiple bilateral pulmonary embolism , negative hypercoagulable workup 09/2008 hospital stay  . Scoliosis   . Syncope and  collapse   . Type II or unspecified type diabetes mellitus without mention of complication, not stated as uncontrolled   . Unspecified essential hypertension   . Unspecified hypothyroidism    hypo    Social History   Social History  . Marital status: Widowed    Spouse name: N/A  . Number of children: 2  . Years of education: N/A   Occupational History  . 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 Smoking status: Former Smoker    Years: 20.00    Quit date: 09/07/1989  . Smokeless tobacco: Never Used  . Alcohol use No  . Drug use: No  . Sexual activity: Not on file   Other Topics Concern  . Not on file   Social History Narrative  . No narrative on file    Past Surgical History:  Procedure Laterality Date  . EYE SURGERY    . JOINT REPLACEMENT    . knee replaced    . THYROID SURGERY      Family History  Problem Relation Age of Onset  . Allergies Mother   . Clotting disorder Mother   . Osteoarthritis Mother   . Asthma Mother   . Arthritis Other   . Diabetes Other   . Hyperlipidemia Other   . Hypertension Other   . Coronary artery disease Other   . Stroke Other   . Osteoarthritis Daughter   . Rheum arthritis Maternal Grandmother   .  Clotting disorder Maternal Grandmother   . Clotting disorder Maternal Uncle   . Clotting disorder Daughter   . Allergies Daughter   . Breast cancer Neg Hx     Allergies  Allergen Reactions  . Metformin Diarrhea  . Pineapple Swelling    Throat swells and blisters on tongue and roof of mouth per patient  . Tetracycline Hives  . Aspirin Itching and Rash  . Contrast Media [Iodinated Diagnostic Agents] Itching       . Flagyl [Metronidazole] Other (See Comments)    Unknown   . Fluticasone-Salmeterol Itching and Rash  . Lactose Intolerance (Gi) Other (See Comments)    gas  . Latex Itching and Rash  . Lisinopril Cough  . Sulfonamide Derivatives Itching and Rash    Current  Outpatient Prescriptions on File Prior to Visit  Medication Sig Dispense Refill  . atorvastatin (LIPITOR) 80 MG tablet Take 1 tablet (80 mg total) by mouth daily. (Patient taking differently: Take 40 mg by mouth daily. ) 90 tablet 3  . BIOTIN PO Take 1 tablet by mouth daily.    . Cholecalciferol (VITAMIN D3) 2000 units TABS Take 2,000 Units by mouth daily.    . Coenzyme Q10 200 MG capsule Take 200 mg by mouth daily.      Marland Kitchen docusate sodium (COLACE) 100 MG capsule Take 1 capsule by mouth daily.    . fenofibrate 54 MG tablet TAKE 1 TABLET(54 MG) BY MOUTH DAILY 90 tablet 0  . fexofenadine (ALLEGRA) 180 MG tablet Take 180 mg by mouth daily.      . furosemide (LASIX) 20 MG tablet TAKE 2 TABLETS BY MOUTH DAILY 180 tablet 2  . insulin NPH-regular Human (HUMULIN 70/30) (70-30) 100 UNIT/ML injection Inject 70 units under the skin in the morning, 15 units at noon and 65 units every evening.    . Insulin Pen Needle (B-D UF III MINI PEN NEEDLES) 31G X 5 MM MISC Use daily for insulin injection 100 each 2  . Insulin Syringe-Needle U-100 (INSULIN SYRINGE 1CC/30GX5/16") 30G X 5/16" 1 ML MISC USE BID UTD  11  . levothyroxine (SYNTHROID, LEVOTHROID) 112 MCG tablet Take 1 tablet (112 mcg total) by mouth daily. 90 tablet 3  . Multiple Vitamin (MULTIVITAMIN) tablet Take 1 tablet by mouth daily.      . potassium citrate (UROCIT-K) 10 MEQ (1080 MG) SR tablet TAKE 1 TABLET BY MOUTH THREE TIMES DAILY WITH MEALS (Patient taking differently: TAKE 2 TABLETS TWICE DAILY) 90 tablet 0  . Probiotic Product (PROBIOTIC PO) Take 1 capsule by mouth daily.    . ranitidine (ZANTAC) 150 MG tablet Take 150 mg by mouth daily as needed for heartburn.    . sertraline (ZOLOFT) 50 MG tablet Take 1 tablet (50 mg total) by mouth daily. 90 tablet 1  . vitamin C (ASCORBIC ACID) 250 MG tablet Take 250 mg by mouth daily.    Marland Kitchen glipiZIDE (GLUCOTROL) 5 MG tablet Take 1 tablet (5 mg total) by mouth 2 (two) times daily before a meal. (Patient taking  differently: Take 10 mg by mouth 2 (two) times daily before a meal. Take 2 tablets 10 mg total two times daily - per patient.) 180 tablet 3   No current facility-administered medications on file prior to visit.     BP (!) 146/66 (BP Location: Left Arm)   Temp 98.2 F (36.8 C) (Oral)   Ht 5\' 1"  (1.549 m)   Wt 245 lb (111.1 kg)   BMI 46.29 kg/m  Objective:   Physical Exam  Constitutional: She is oriented to person, place, and time. She appears well-developed and well-nourished. No distress.  Cardiovascular: Normal rate, regular rhythm, normal heart sounds and intact distal pulses.  Exam reveals no gallop and no friction rub.   No murmur heard. Pulmonary/Chest: Effort normal and breath sounds normal. No respiratory distress. She has no wheezes. She has no rales. She exhibits no tenderness.  Neurological: She is alert and oriented to person, place, and time.  Skin: She is not diaphoretic.  Psychiatric: She has a normal mood and affect. Her behavior is normal. Thought content normal.  Nursing note and vitals reviewed.     Assessment & Plan:  1. Type 2 diabetes mellitus with stage 4 chronic kidney disease, with long-term current use of insulin (HCC) - POCT A1C- 6.2, improved from 7.0  - I did not know she had been seen by Dr. Cruzita Lederer prior to POC A1c.  - Advised to follow up with Dr. Cruzita Lederer for further testing of A1c - I think she may need to get a new blood sugar monitor at home as her readings do not correspond to her A1c. She is in agreement to this   Dorothyann Peng, NP

## 2017-06-02 NOTE — Telephone Encounter (Signed)
[name] called in reference to being told she needs to get in with Dr. Cruzita Lederer ASAP due to diabetic spots in eyes and platelets being down in the 20's. PCP highly suggest something be done with [name]'s insulin. Please call [name] and advise. [name] has an appointment scheduled for 10/4 but would like to get in sooner if possible.

## 2017-06-03 ENCOUNTER — Telehealth: Payer: Self-pay

## 2017-06-03 ENCOUNTER — Ambulatory Visit (HOSPITAL_BASED_OUTPATIENT_CLINIC_OR_DEPARTMENT_OTHER): Payer: Medicare Other

## 2017-06-03 VITALS — BP 125/52 | HR 60 | Temp 98.3°F | Resp 18

## 2017-06-03 DIAGNOSIS — D509 Iron deficiency anemia, unspecified: Secondary | ICD-10-CM | POA: Diagnosis present

## 2017-06-03 DIAGNOSIS — D696 Thrombocytopenia, unspecified: Secondary | ICD-10-CM

## 2017-06-03 MED ORDER — SODIUM CHLORIDE 0.9% FLUSH
10.0000 mL | INTRAVENOUS | Status: DC | PRN
  Filled 2017-06-03: qty 10

## 2017-06-03 MED ORDER — SODIUM CHLORIDE 0.9 % IV SOLN
Freq: Once | INTRAVENOUS | Status: AC
  Administered 2017-06-03: 14:00:00 via INTRAVENOUS

## 2017-06-03 MED ORDER — SODIUM CHLORIDE 0.9 % IV SOLN
510.0000 mg | Freq: Once | INTRAVENOUS | Status: AC
  Administered 2017-06-03: 510 mg via INTRAVENOUS
  Filled 2017-06-03: qty 17

## 2017-06-03 MED ORDER — SODIUM CHLORIDE 0.9% FLUSH
3.0000 mL | Freq: Once | INTRAVENOUS | Status: DC | PRN
  Filled 2017-06-03: qty 10

## 2017-06-03 NOTE — Telephone Encounter (Signed)
None of the 2 problems are related to her insulin. I just saw the pt 1 mo ago and we made changes in her regimen. No need to see her sooner than 1 week from now, but please let me know about any lows or sugars >200 consistently.

## 2017-06-03 NOTE — Telephone Encounter (Signed)
Called and notified patient of MD note, patient will wait until next appointment next week.

## 2017-06-03 NOTE — Telephone Encounter (Signed)
Please advise on how to proceed?? Thank you!

## 2017-06-03 NOTE — Patient Instructions (Signed)

## 2017-06-04 DIAGNOSIS — M47814 Spondylosis without myelopathy or radiculopathy, thoracic region: Secondary | ICD-10-CM | POA: Diagnosis not present

## 2017-06-04 DIAGNOSIS — M546 Pain in thoracic spine: Secondary | ICD-10-CM | POA: Diagnosis not present

## 2017-06-04 DIAGNOSIS — M9902 Segmental and somatic dysfunction of thoracic region: Secondary | ICD-10-CM | POA: Diagnosis not present

## 2017-06-04 DIAGNOSIS — M5134 Other intervertebral disc degeneration, thoracic region: Secondary | ICD-10-CM | POA: Diagnosis not present

## 2017-06-06 ENCOUNTER — Other Ambulatory Visit: Payer: Self-pay | Admitting: Family

## 2017-06-07 DIAGNOSIS — M5134 Other intervertebral disc degeneration, thoracic region: Secondary | ICD-10-CM | POA: Diagnosis not present

## 2017-06-07 DIAGNOSIS — M9902 Segmental and somatic dysfunction of thoracic region: Secondary | ICD-10-CM | POA: Diagnosis not present

## 2017-06-07 DIAGNOSIS — M47814 Spondylosis without myelopathy or radiculopathy, thoracic region: Secondary | ICD-10-CM | POA: Diagnosis not present

## 2017-06-07 DIAGNOSIS — M546 Pain in thoracic spine: Secondary | ICD-10-CM | POA: Diagnosis not present

## 2017-06-08 ENCOUNTER — Telehealth: Payer: Self-pay | Admitting: Internal Medicine

## 2017-06-08 ENCOUNTER — Encounter: Payer: Self-pay | Admitting: Cardiology

## 2017-06-08 ENCOUNTER — Ambulatory Visit (INDEPENDENT_AMBULATORY_CARE_PROVIDER_SITE_OTHER): Payer: Medicare Other | Admitting: Cardiology

## 2017-06-08 VITALS — BP 160/64 | HR 84 | Ht 61.0 in | Wt 248.0 lb

## 2017-06-08 DIAGNOSIS — E7800 Pure hypercholesterolemia, unspecified: Secondary | ICD-10-CM | POA: Diagnosis not present

## 2017-06-08 DIAGNOSIS — I5032 Chronic diastolic (congestive) heart failure: Secondary | ICD-10-CM

## 2017-06-08 DIAGNOSIS — Z760 Encounter for issue of repeat prescription: Secondary | ICD-10-CM | POA: Diagnosis not present

## 2017-06-08 DIAGNOSIS — I480 Paroxysmal atrial fibrillation: Secondary | ICD-10-CM | POA: Diagnosis not present

## 2017-06-08 MED ORDER — FUROSEMIDE 20 MG PO TABS: 20.0000 mg | ORAL_TABLET | Freq: Every day | ORAL | 2 refills | Status: DC | PRN

## 2017-06-08 NOTE — Telephone Encounter (Signed)
Noted, will be on the look out.

## 2017-06-08 NOTE — Progress Notes (Signed)
Cardiology Office Note    Date:  06/08/2017   ID:  Sara, [name] 10-Nov-1934, MRN [id number]  PCP:  Dorothyann Peng, NP  Cardiologist:   [name], MD   No chief complaint on file.   History of Present Illness:  [name] is a 82 y.o. female here for Follow-up of paroxysmal atrial fibrillation, anticoagulation and diastolic heart failure.   She had melena, GI bleed and needed a blood transfusion and was seen last by Dr. Oletta Lamas of gastroenterology on 04/29/17. She had seen Dr. Lutricia Feil in the past because of prompt cytopenia. Hemoglobin was 6.6. He planned on proceeding with EGD and colonoscopy, she is getting this done in a few days. Getting iron infusions.  No further bleeding after stopping the Eliquis. She also had concomitant thrombocytopenia as well and her platelet counts have decreased down to 29,000.  Thankfully, she has been maintaining sinus rhythm since stopping the Mybetric.  She has had an issue with mild thrombocytopenia in the past. She has a prior history of pulmonary embolism as well.  Past Medical History:  Diagnosis Date  . Allergic rhinitis   . Anxiety state, unspecified    panic attacks  . CHF (congestive heart failure) ([place])   . Depressive disorder, not elsewhere classified   . Extrinsic asthma, unspecified    no problem since adulthood  . Obesity   . OSA on CPAP    severe  . Pure hypercholesterolemia   . Respiratory failure with hypoxia ([place]) 09/2008   acute, secondary to multiple bilateral pulmonary embolism , negative hypercoagulable workup 09/2008 hospital stay  . Scoliosis   . Syncope and collapse   . Type II or unspecified type diabetes mellitus without mention of complication, not stated as uncontrolled   . Unspecified essential hypertension   . Unspecified hypothyroidism    hypo    Past Surgical History:  Procedure Laterality Date  . EYE SURGERY    . JOINT REPLACEMENT    . knee replaced    . THYROID SURGERY       Outpatient Medications Prior to Visit  Medication Sig Dispense Refill  . BIOTIN PO Take 1 tablet by mouth daily.    . Calcium Carbonate-Vit D-Min (CALCIUM 1200 PO) Take 1 tablet by mouth daily.    . Cholecalciferol (VITAMIN D3) 2000 units TABS Take 2,000 Units by mouth daily.    . Coenzyme Q10 200 MG capsule Take 200 mg by mouth daily.      Marland Kitchen docusate sodium (COLACE) 100 MG capsule Take 1 capsule by mouth daily.    . fenofibrate 54 MG tablet TAKE 1 TABLET(54 MG) BY MOUTH DAILY 90 tablet 0  . fexofenadine (ALLEGRA) 180 MG tablet Take 180 mg by mouth daily.      . insulin NPH-regular Human (HUMULIN 70/30) (70-30) 100 UNIT/ML injection Inject 70 units under the skin in the morning, 15 units at noon and 65 units every evening.    . Insulin Pen Needle (B-D UF III MINI PEN NEEDLES) 31G X 5 MM MISC Use daily for insulin injection 100 each 2  . Insulin Syringe-Needle U-100 (INSULIN SYRINGE 1CC/30GX5/16") 30G X 5/16" 1 ML MISC USE BID UTD  11  . levothyroxine (SYNTHROID, LEVOTHROID) 112 MCG tablet Take 1 tablet (112 mcg total) by mouth daily. 90 tablet 3  . Multiple Vitamin (MULTIVITAMIN) tablet Take 1 tablet by mouth daily.      . Probiotic Product (PROBIOTIC PO) Take 1 capsule by mouth daily.    Marland Kitchen  ranitidine (ZANTAC) 150 MG tablet Take 150 mg by mouth daily as needed for heartburn.    . sertraline (ZOLOFT) 50 MG tablet Take 1 tablet (50 mg total) by mouth daily. 90 tablet 1  . vitamin C (ASCORBIC ACID) 250 MG tablet Take 250 mg by mouth daily.    . furosemide (LASIX) 20 MG tablet TAKE 2 TABLETS BY MOUTH DAILY 180 tablet 2  . potassium citrate (UROCIT-K) 10 MEQ (1080 MG) SR tablet TAKE 1 TABLET BY MOUTH THREE TIMES DAILY WITH MEALS (Patient taking differently: TAKE 2 TABLETS TWICE DAILY) 90 tablet 0  . atorvastatin (LIPITOR) 80 MG tablet Take 1 tablet (80 mg total) by mouth daily. (Patient taking differently: Take 40 mg by mouth daily. ) 90 tablet 3  . glipiZIDE (GLUCOTROL) 5 MG tablet Take 1  tablet (5 mg total) by mouth 2 (two) times daily before a meal. (Patient taking differently: Take 10 mg by mouth 2 (two) times daily before a meal. Take 2 tablets 10 mg total two times daily - per patient.) 180 tablet 3   No facility-administered medications prior to visit.      Allergies:   Metformin; Pineapple; Tetracycline; Aspirin; Contrast media [iodinated diagnostic agents]; Flagyl [metronidazole]; Fluticasone-salmeterol; Lactose intolerance (gi); Latex; Lisinopril; and Sulfonamide derivatives   Social History   Social History  . Marital status: Widowed    Spouse name: N/A  . Number of children: 2  . Years of education: N/A   Occupational History  . 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 Smoking status: Former Smoker    Years: 20.00    Quit date: 09/07/1989  . Smokeless tobacco: Never Used  . Alcohol use No  . Drug use: No  . Sexual activity: Not Asked   Other Topics Concern  . None   Social History Narrative  . None     Family History:  The patient's family history includes Allergies in her daughter and mother; Arthritis in her other; Asthma in her mother; Clotting disorder in her daughter, maternal grandmother, maternal uncle, and mother; Coronary artery disease in her other; Diabetes in her other; Hyperlipidemia in her other; Hypertension in her other; Osteoarthritis in her daughter and mother; Rheum arthritis in her maternal grandmother; Stroke in her other.   ROS:   Please see the history of present illness.    ROS  No syncope no bleeding, no orthopnea, no PND Unless above all other review of systems negative   PHYSICAL EXAM:   VS:  BP (!) 160/64   Pulse 84   Ht 5\' 1"  (1.549 m)   Wt 248 lb (112.5 kg)   LMP  (LMP Unknown)   BMI 46.86 kg/m    GEN: Well nourished, well developed, in no acute distress obese HEENT: normal  Neck: no JVD, carotid bruits, or masses Cardiac: RRR; no murmurs, rubs, or gallops,no  edema  Respiratory:  clear to auscultation bilaterally, normal work of breathing GI: soft, nontender, nondistended, + BS MS: no deformity or atrophy  Skin: warm and dry, no rash, no bruising noted Neuro:  Alert and Oriented x 3, Strength and sensation are intact Psych: euthymic mood, full affect   Wt Readings from Last 3 Encounters:  06/08/17 248 lb (112.5 kg)  06/02/17 245 lb (111.1 kg)  06/01/17 246 lb (111.6 kg)      Studies/Labs Reviewed:   EKG:  01/12/17-atrial flutter/fibrillation with variable conduction heart rate 84 bpm, left  axis deviation, baseline artifact. 08/24/15-sinus rhythm, 70, left axis deviation, poor R-wave progression personally viewed. EKG on 02/11/17 showed normal sinus rhythm.  Recent Labs: 12/15/2016: TSH 1.82 06/01/2017: ALT(SGPT) 29; BUN, Bld 39; Creat 2.4; HGB 10.3; Platelets 29 Platelet count consistent in citrate; Potassium 4.7; Sodium 141   Lipid Panel    Component Value Date/Time   CHOL 166 12/15/2016 0853   TRIG (H) 12/15/2016 0853    513.0 Triglyceride is over 400; calculations on Lipids are invalid.   HDL 30.50 (L) 12/15/2016 0853   CHOLHDL 5 12/15/2016 0853   VLDL 118.2 (H) 09/25/2011 1511   LDLCALC 65 08/16/2009 0902   LDLDIRECT 54.0 12/15/2016 0853    Additional studies/ records that were reviewed today include:   08/24/15-CT Angio chest PE-minimal coronary artery calcification of the ostial LAD. Aortic atherosclerosis noted.  Nuclear stress test was performed in October 2016 in [place]. Low risk  Echocardiogram 08/25/15:  - Left ventricle: The cavity size was normal. There was mild   concentric hypertrophy. Systolic function was normal. The   estimated ejection fraction was in the range of 60% to 65%. Wall   motion was normal; there were no regional wall motion   abnormalities. Features are consistent with a pseudonormal left   ventricular filling pattern, with concomitant abnormal relaxation   and increased filling pressure (grade  2 diastolic dysfunction).   Doppler parameters are consistent with elevated ventricular   end-diastolic filling pressure. - Aortic valve: Trileaflet; normal thickness leaflets. There was no   regurgitation. - Aortic root: The aortic root was normal in size. - Ascending aorta: The ascending aorta was normal in size. - Mitral valve: Structurally normal valve. Valve area by pressure   half-time: 1.76 cm^2. Valve area by continuity equation (using   LVOT flow): 2.48 cm^2. - Left atrium: The atrium was moderately dilated. - Right ventricle: The cavity size was normal. Wall thickness was   normal. Systolic function was normal. - Right atrium: The atrium was normal in size. - Tricuspid valve: There was no regurgitation. - Pulmonary arteries: Systolic pressure was within the normal   range. - Inferior vena cava: The vessel was normal in size. - Pericardium, extracardiac: A mild pericardial effusion was   identified posterior to the heart. Features were not consistent   with tamponade physiology.    ASSESSMENT:    1. Paroxysmal atrial fibrillation (HCC)   2. Medication refill   3. Chronic diastolic heart failure ([place])   4. Morbid obesity ([place])   5. Pure hypercholesterolemia      PLAN:  In order of problems listed above:  Atrial fibrillation/flutter  - Noted on ECG 01/12/17 with associated palpitations.  - It seemed to correlate with Mybetriq  - Thankfully, repeat ECG shows normal rhythm in June 2018. She ended up having gastrointestinal bleeding with hemoglobin of 6.6 in the setting of thrombocytopenia as well and Eliquis use. Eliquis has been stopped. Appreciate Dr. Oletta Lamas with GI evaluation. She is undergoing endoscopy soon. We will hold off on anticoagulation at this time especially given her thrombocytopenia and return to normal sinus rhythm. If she does return to atrial flutter or fibrillation, she is at increased risk for stroke.  Thrombocytopenia  - She has seen Dr. Katheran Awe  who is working on this. Last platelet count was 29,000. She is also getting iron infusions.   Chronic diastolic heart failure  - Mostly related to obesity. Echocardiogram as above. Exercise. 1-1.5 L fluid restriction as well as salt restriction. She would  like to take her Lasix on an as-needed basis because of her urinary incontinence. She will watch her weights closely.  Morbid obesity  - Weight loss very important. She is on a plant-based diet as prescribed by her endocrinologist.  History of PE  - Last CT scan reassuring.  Chronic kidney disease stage IV  - Dr. Pearson Grippe.   Diabetes with hypertension  - Per primary team.  Since where making no changes to her medication, we will see her back in one year. If atrial fibrillation does return, we will need to determine the risks versus benefits of anticoagulation given her hematologic situation and prior GI bleed. Await results of endoscopies.   Medication Adjustments/Labs and Tests Ordered: Current medicines are reviewed at length with the patient today.  Concerns regarding medicines are outlined above.  Medication changes, Labs and Tests ordered today are listed in the Patient Instructions below. Patient Instructions  Medication Instructions:  Please take Furosemide 20 mg a day as needed for weight gain of 2 to 3 lbs overnight. Continue all other medications as listed.  Please limit your fluid intake to 1.5 liters a day or 1,500 ml. Please weigh daily and keep a written log of these.  Follow-Up: Follow up in 1 year with Dr. Marlou Porch.  You will receive a letter in the mail 2 months before you are due.  Please call us when you receive this letter to schedule your follow up appointment.  If you need a refill on your cardiac medications before your next appointment, please call your pharmacy.  Thank you for choosing [facility]!!          Signed, [name], MD  06/08/2017 12:12 PM    [address], [place], [place]  [address] Phone: [phone]; Fax: [phone]

## 2017-06-08 NOTE — Telephone Encounter (Signed)
[employer] [facility] [employer] called to check the status of the diabetic supplies for the patient. After confirming the fax number, their department had the incorrect fax number. I provided the correct fax. Endo office on stand by for fax.

## 2017-06-08 NOTE — Patient Instructions (Signed)
Medication Instructions:  Please take Furosemide 20 mg a day as needed for weight gain of 2 to 3 lbs overnight. Continue all other medications as listed.  Please limit your fluid intake to 1.5 liters a day or 1,500 ml. Please weigh daily and keep a written log of these.  Follow-Up: Follow up in 1 year with Dr. Marlou Porch.  You will receive a letter in the mail 2 months before you are due.  Please call us when you receive this letter to schedule your follow up appointment.  If you need a refill on your cardiac medications before your next appointment, please call your pharmacy.  Thank you for choosing [facility]!!

## 2017-06-09 ENCOUNTER — Other Ambulatory Visit (HOSPITAL_BASED_OUTPATIENT_CLINIC_OR_DEPARTMENT_OTHER): Payer: Medicare Other

## 2017-06-09 ENCOUNTER — Ambulatory Visit (HOSPITAL_BASED_OUTPATIENT_CLINIC_OR_DEPARTMENT_OTHER): Payer: Medicare Other

## 2017-06-09 VITALS — BP 160/36 | HR 58 | Temp 98.7°F | Resp 20

## 2017-06-09 DIAGNOSIS — N184 Chronic kidney disease, stage 4 (severe): Secondary | ICD-10-CM | POA: Diagnosis not present

## 2017-06-09 DIAGNOSIS — D696 Thrombocytopenia, unspecified: Secondary | ICD-10-CM

## 2017-06-09 DIAGNOSIS — K760 Fatty (change of) liver, not elsewhere classified: Secondary | ICD-10-CM

## 2017-06-09 DIAGNOSIS — D500 Iron deficiency anemia secondary to blood loss (chronic): Secondary | ICD-10-CM

## 2017-06-09 LAB — CMP (CANCER CENTER ONLY)
ALT(SGPT): 28 U/L (ref 10–47)
AST: 27 U/L (ref 11–38)
Albumin: 3.6 g/dL (ref 3.3–5.5)
Alkaline Phosphatase: 44 U/L (ref 26–84)
BUN, Bld: 30 mg/dL — ABNORMAL HIGH (ref 7–22)
CO2: 29 mEq/L (ref 18–33)
Calcium: 10 mg/dL (ref 8.0–10.3)
Chloride: 103 mEq/L (ref 98–108)
Creat: 1.9 mg/dl — ABNORMAL HIGH (ref 0.6–1.2)
Glucose, Bld: 290 mg/dL — ABNORMAL HIGH (ref 73–118)
Potassium: 4.8 mEq/L — ABNORMAL HIGH (ref 3.3–4.7)
Sodium: 146 mEq/L — ABNORMAL HIGH (ref 128–145)
Total Bilirubin: 0.8 mg/dl (ref 0.20–1.60)
Total Protein: 7.1 g/dL (ref 6.4–8.1)

## 2017-06-09 LAB — CBC WITH DIFFERENTIAL (CANCER CENTER ONLY)
BASO#: 0 10*3/uL (ref 0.0–0.2)
BASO%: 0.5 % (ref 0.0–2.0)
EOS%: 3.6 % (ref 0.0–7.0)
Eosinophils Absolute: 0.2 10*3/uL (ref 0.0–0.5)
HCT: 30 % — ABNORMAL LOW (ref 34.8–46.6)
HGB: 9.8 g/dL — ABNORMAL LOW (ref 11.6–15.9)
LYMPH#: 1 10*3/uL (ref 0.9–3.3)
LYMPH%: 22.8 % (ref 14.0–48.0)
MCH: 32.6 pg (ref 26.0–34.0)
MCHC: 32.7 g/dL (ref 32.0–36.0)
MCV: 100 fL (ref 81–101)
MONO#: 0.4 10*3/uL (ref 0.1–0.9)
MONO%: 9.1 % (ref 0.0–13.0)
NEUT#: 2.8 10*3/uL (ref 1.5–6.5)
NEUT%: 64 % (ref 39.6–80.0)
Platelets: 34 10*3/uL — ABNORMAL LOW (ref 145–400)
RBC: 3.01 10*6/uL — ABNORMAL LOW (ref 3.70–5.32)
RDW: 20.9 % — ABNORMAL HIGH (ref 11.1–15.7)
WBC: 4.4 10*3/uL (ref 3.9–10.0)

## 2017-06-09 MED ORDER — ROMIPLOSTIM 250 MCG ~~LOC~~ SOLR
2.0000 ug/kg | Freq: Once | SUBCUTANEOUS | Status: AC
  Administered 2017-06-09: 225 ug via SUBCUTANEOUS
  Filled 2017-06-09: qty 0.45

## 2017-06-09 NOTE — Patient Instructions (Signed)
Romiplostim injection What is this medicine? ROMIPLOSTIM (roe mi PLOE stim) helps your body make more platelets. This medicine is used to treat low platelets caused by chronic idiopathic thrombocytopenic purpura (ITP). This medicine may be used for other purposes; ask your health care provider or pharmacist if you have questions. COMMON BRAND NAME(S): Nplate What should I tell my health care provider before I take this medicine? They need to know if you have any of these conditions: -cancer or myelodysplastic syndrome -low blood counts, like low white cell, platelet, or red cell counts -take medicines that treat or prevent blood clots -an unusual or allergic reaction to romiplostim, mannitol, other medicines, foods, dyes, or preservatives -pregnant or trying to get pregnant -breast-feeding How should I use this medicine? This medicine is for injection under the skin. It is given by a health care professional in a hospital or clinic setting. A special MedGuide will be given to you before your injection. Read this information carefully each time. Talk to your pediatrician regarding the use of this medicine in children. Special care may be needed. Overdosage: If you think you have taken too much of this medicine contact a poison control center or emergency room at once. NOTE: This medicine is only for you. Do not share this medicine with others. What if I miss a dose? It is important not to miss your dose. Call your doctor or health care professional if you are unable to keep an appointment. What may interact with this medicine? Interactions are not expected. This list may not describe all possible interactions. Give your health care provider a list of all the medicines, herbs, non-prescription drugs, or dietary supplements you use. Also tell them if you smoke, drink alcohol, or use illegal drugs. Some items may interact with your medicine. What should I watch for while using this  medicine? Your condition will be monitored carefully while you are receiving this medicine. Visit your prescriber or health care professional for regular checks on your progress and for the needed blood tests. It is important to keep all appointments. What side effects may I notice from receiving this medicine? Side effects that you should report to your doctor or health care professional as soon as possible: -allergic reactions like skin rash, itching or hives, swelling of the face, lips, or tongue -shortness of breath, chest pain, swelling in a leg -unusual bleeding or bruising Side effects that usually do not require medical attention (report to your doctor or health care professional if they continue or are bothersome): -dizziness -headache -muscle aches -pain in arms and legs -stomach pain -trouble sleeping This list may not describe all possible side effects. Call your doctor for medical advice about side effects. You may report side effects to FDA at 1-800-FDA-1088. Where should I keep my medicine? This drug is given in a hospital or clinic and will not be stored at home. NOTE: This sheet is a summary. It may not cover all possible information. If you have questions about this medicine, talk to your doctor, pharmacist, or health care provider.  2018 Elsevier/Gold Standard (2008-04-23 15:13:04)  

## 2017-06-10 ENCOUNTER — Encounter: Payer: Self-pay | Admitting: Internal Medicine

## 2017-06-10 ENCOUNTER — Encounter (HOSPITAL_COMMUNITY): Admission: RE | Payer: Self-pay | Source: Ambulatory Visit

## 2017-06-10 ENCOUNTER — Encounter: Payer: Self-pay | Admitting: *Deleted

## 2017-06-10 ENCOUNTER — Telehealth: Payer: Self-pay | Admitting: *Deleted

## 2017-06-10 ENCOUNTER — Ambulatory Visit (INDEPENDENT_AMBULATORY_CARE_PROVIDER_SITE_OTHER): Payer: Medicare Other | Admitting: Internal Medicine

## 2017-06-10 ENCOUNTER — Other Ambulatory Visit: Payer: Self-pay | Admitting: Family

## 2017-06-10 ENCOUNTER — Ambulatory Visit (HOSPITAL_COMMUNITY): Admission: RE | Admit: 2017-06-10 | Payer: Medicare Other | Source: Ambulatory Visit | Admitting: Gastroenterology

## 2017-06-10 VITALS — BP 138/72 | HR 60 | Wt 249.0 lb

## 2017-06-10 DIAGNOSIS — E1122 Type 2 diabetes mellitus with diabetic chronic kidney disease: Secondary | ICD-10-CM | POA: Diagnosis not present

## 2017-06-10 DIAGNOSIS — Z6841 Body Mass Index (BMI) 40.0 and over, adult: Secondary | ICD-10-CM

## 2017-06-10 DIAGNOSIS — Z91041 Radiographic dye allergy status: Secondary | ICD-10-CM

## 2017-06-10 DIAGNOSIS — N184 Chronic kidney disease, stage 4 (severe): Secondary | ICD-10-CM | POA: Diagnosis not present

## 2017-06-10 DIAGNOSIS — Z794 Long term (current) use of insulin: Secondary | ICD-10-CM

## 2017-06-10 SURGERY — COLONOSCOPY WITH PROPOFOL
Anesthesia: Monitor Anesthesia Care

## 2017-06-10 MED ORDER — PREDNISONE 50 MG PO TABS: ORAL_TABLET | ORAL | 0 refills | Status: DC

## 2017-06-10 MED ORDER — DIPHENHYDRAMINE HCL 50 MG PO TABS: 50.0000 mg | ORAL_TABLET | Freq: Once | ORAL | 0 refills | Status: DC

## 2017-06-10 NOTE — Progress Notes (Addendum)
Patient ID: [name], female   DOB: Dec 02, 1934, 82 y.o.   MRN: [id number]   HPI: [name] is a 82 y.o.-year-old femalele, returning for f/u for DM2, dx in late 1990s, insulin-dependent since 2012, uncontrolled, with complications (CKD stage 4, CHF, + DR). She prev. Saw Dr. Chalmers Cater. Last visit with me 1.5 mo ago. She is here with her daughter who offers part of the hx especially Re; Pt's PMH and her sugars.  She had severe anemia and Tc-penia dx'ed >> will have w/u by GI. Now on iron infusion.   She is now eating a plant-based diet. She loves it!  Last hemoglobin A1c was: Lab Results  Component Value Date   HGBA1C 6.2 06/02/2017   HGBA1C 7.0 02/17/2017   HGBA1C 7.0 (H) 12/15/2016   Pt was on a regimen of: - Glipizide 10 mg bid AFTER meals (increased recently) - Humulin 70/30 70-15 (forgets)-65 units AFTER meals   At last visit, we moved these before meals: - Glipizide 10 mg 2x a day before meals - Humulin 70/30: 70-(15)-65 units before meals. May forget the lunchtime insulin 2/7 days.  Pt checks her sugars 2x a day: - am: 160-226, 267, 359 >> 141-206, 253 with the infusion - 2h after b'fast: n/c >> 290 - before lunch: n/c >> 171-188, 219 - 2h after lunch: n/c - before dinner: 170-267 >> 131-208, 368 (infusion) - 2h after dinner: n/c - bedtime: n/c >> 369 - nighttime: n/c Lowest sugar was 60 >> 131; she has hypoglycemia awareness at 100.  Highest sugar was 500s x 1 lately >> 368.  Glucometer: True Metrix air  Pt's meals are: - Breakfast: grits, bacon, green tea + stevia - Lunch: 1/2 sandwich + salad + chips and drink - snack: fruit - Dinner:meat + veggies + occas. starch - Snacks: 2 - usually salty  + yoga and PT 2x a week  - she has CKD, last BUN/creatinine:  Lab Results  Component Value Date   BUN 30 (H) 06/09/2017   BUN 39 (H) 06/01/2017   CREATININE 1.9 (H) 06/09/2017   CREATININE 2.4 (H) 06/01/2017   Lab Results  Component Value Date   GFRNONAA 29  (L) 04/23/2017   GFRNONAA 28 (L) 02/09/2017   GFRNONAA 28 (L) 02/02/2017   GFRNONAA 32 (L) 01/26/2017   GFRNONAA 19 (L) 10/14/2015   GFRNONAA 15 (L) 08/29/2015   GFRNONAA 19 (L) 08/28/2015   GFRNONAA 20 (L) 08/27/2015   GFRNONAA 20 (L) 08/26/2015   GFRNONAA 21 (L) 08/25/2015  He has a h/o uric acid kidney stones. - + HL. last set of lipids: Lab Results  Component Value Date   CHOL 166 12/15/2016   HDL 30.50 (L) 12/15/2016   LDLCALC 65 08/16/2009   LDLDIRECT 54.0 12/15/2016   TRIG (H) 12/15/2016    513.0 Triglyceride is over 400; calculations on Lipids are invalid.   CHOLHDL 5 12/15/2016  On Lipitor, fenofibrate - last eye exam was in 05/2017 >> + DR.  - she has numbness and tingling in her toes.  She has urinary incontinence >> started Myrbetriq >> developed A fib >> started Eloquis >> had bleeding >> received a blood transfusion recently.  She has hypothyroidism - on LT4 112 mcg daily.  Recent TSH normal: Lab Results  Component Value Date   TSH 1.82 12/15/2016    ROS: Constitutional: + weight gain, + fatigue, no subjective hyperthermia, no subjective hypothermia Eyes: no blurry vision, no xerophthalmia ENT: no sore throat, no nodules  palpated in throat, no dysphagia, no odynophagia, no hoarseness Cardiovascular: no CP/+ occ. SOB/no palpitations/no leg swelling Respiratory: no cough/+ occ. SOB/no wheezing Gastrointestinal: no N/no V/no D/no C/no acid reflux Musculoskeletal: no muscle aches/no joint aches Skin: no rashes, + hair loss Neurological: no tremors/+ numbness/+ tingling/no dizziness  I reviewed pt's medications, allergies, PMH, social hx, family hx, and changes were documented in the history of present illness. Otherwise, unchanged from my initial visit note.  Past Medical History:  Diagnosis Date  . Allergic rhinitis   . Anxiety state, unspecified    panic attacks  . CHF (congestive heart failure) ([place])   . Depressive disorder, not elsewhere classified    . Extrinsic asthma, unspecified    no problem since adulthood  . Obesity   . OSA on CPAP    severe  . Pure hypercholesterolemia   . Respiratory failure with hypoxia ([place]) 09/2008   acute, secondary to multiple bilateral pulmonary embolism , negative hypercoagulable workup 09/2008 hospital stay  . Scoliosis   . Syncope and collapse   . Type II or unspecified type diabetes mellitus without mention of complication, not stated as uncontrolled   . Unspecified essential hypertension   . Unspecified hypothyroidism    hypo   Past Surgical History:  Procedure Laterality Date  . EYE SURGERY    . JOINT REPLACEMENT    . knee replaced    . THYROID SURGERY     Social History   Social History  . Marital status: Widowed    Spouse name: N/A  . Number of children: 2   Occupational History  . 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 Smoking status: Former Smoker    Years: 20.00    Quit date: 09/07/1989  . Smokeless tobacco: Never Used  . Alcohol use No  . Drug use: No   Current Outpatient Prescriptions on File Prior to Visit  Medication Sig Dispense Refill  . atorvastatin (LIPITOR) 40 MG tablet Take 40 mg by mouth daily.    Marland Kitchen BIOTIN PO Take 1 tablet by mouth daily.    . Calcium Carbonate-Vit D-Min (CALCIUM 1200 PO) Take 1 tablet by mouth daily.    . Cholecalciferol (VITAMIN D3) 2000 units TABS Take 2,000 Units by mouth daily.    . Coenzyme Q10 200 MG capsule Take 200 mg by mouth daily.      Marland Kitchen docusate sodium (COLACE) 100 MG capsule Take 1 capsule by mouth daily.    . fenofibrate 54 MG tablet TAKE 1 TABLET(54 MG) BY MOUTH DAILY 90 tablet 0  . fexofenadine (ALLEGRA) 180 MG tablet Take 180 mg by mouth daily.      . furosemide (LASIX) 20 MG tablet Take 1 tablet (20 mg total) by mouth daily as needed. 180 tablet 2  . glipiZIDE (GLUCOTROL) 10 MG tablet Take 10 mg by mouth 2 (two) times daily before a meal.    . insulin NPH-regular Human  (HUMULIN 70/30) (70-30) 100 UNIT/ML injection Inject 70 units under the skin in the morning, 15 units at noon and 65 units every evening.    . Insulin Pen Needle (B-D UF III MINI PEN NEEDLES) 31G X 5 MM MISC Use daily for insulin injection 100 each 2  . Insulin Syringe-Needle U-100 (INSULIN SYRINGE 1CC/30GX5/16") 30G X 5/16" 1 ML MISC USE BID UTD  11  . levothyroxine (SYNTHROID, LEVOTHROID) 112 MCG tablet Take 1 tablet (112 mcg total) by mouth daily.  90 tablet 3  . Multiple Vitamin (MULTIVITAMIN) tablet Take 1 tablet by mouth daily.      . potassium chloride SA (K-DUR,KLOR-CON) 20 MEQ tablet Take 20 mEq by mouth 2 (two) times daily.    . Probiotic Product (PROBIOTIC PO) Take 1 capsule by mouth daily.    . ranitidine (ZANTAC) 150 MG tablet Take 150 mg by mouth daily as needed for heartburn.    . sertraline (ZOLOFT) 50 MG tablet Take 1 tablet (50 mg total) by mouth daily. 90 tablet 1  . vitamin C (ASCORBIC ACID) 250 MG tablet Take 250 mg by mouth daily.     No current facility-administered medications on file prior to visit.    Allergies  Allergen Reactions  . Metformin Diarrhea  . Pineapple Swelling    Throat swells and blisters on tongue and roof of mouth per patient  . Tetracycline Hives  . Aspirin Itching and Rash  . Contrast Media [Iodinated Diagnostic Agents] Itching       . Flagyl [Metronidazole] Other (See Comments)    Unknown   . Fluticasone-Salmeterol Itching and Rash  . Lactose Intolerance (Gi) Other (See Comments)    gas  . Latex Itching and Rash  . Lisinopril Cough  . Sulfonamide Derivatives Itching and Rash   Family History  Problem Relation Age of Onset  . Allergies Mother   . Clotting disorder Mother   . Osteoarthritis Mother   . Asthma Mother   . Arthritis Other   . Diabetes Other   . Hyperlipidemia Other   . Hypertension Other   . Coronary artery disease Other   . Stroke Other   . Osteoarthritis Daughter   . Rheum arthritis Maternal Grandmother   .  Clotting disorder Maternal Grandmother   . Clotting disorder Maternal Uncle   . Clotting disorder Daughter   . Allergies Daughter   . Breast cancer Neg Hx    PE: BP 138/72 (BP Location: Left Arm, Patient Position: Sitting)   Pulse 60   Wt 249 lb (112.9 kg)   LMP  (LMP Unknown)   SpO2 93%   BMI 47.05 kg/m   Wt Readings from Last 3 Encounters:  06/10/17 249 lb (112.9 kg)  06/08/17 248 lb (112.5 kg)  06/02/17 245 lb (111.1 kg)   Constitutional: overweight, in NAD Eyes: PERRLA, EOMI, no exophthalmos ENT: moist mucous membranes, no thyromegaly, no cervical lymphadenopathy Cardiovascular: RRR, + 1/6 SEM, no RG Respiratory: CTA B Gastrointestinal: abdomen soft, NT, ND, BS+ Musculoskeletal: no deformities, strength intact in all 4 Skin: moist, warm, no rashes Neurological: no tremor with outstretched hands, DTR normal in all 4  ASSESSMENT: 1. DM2, insulin-dependent, uncontrolled, with complications - CKD stage 4 - CHF - + DR  2. Obesity class 3  PLAN:  1. Patient with long-standing, Uncontrolled diabetes, on oral antidiabetic medication and premixed insulin regimen, with improved sugars since last visit, although, they are now quite at goal. Her most recent HbA1c has improved, also, being at 6.2%, however, this was drawn after she had iron infusions so I suspect it is not very accurate. Therefore, today we will check a fructosamine level. - Sugars throughout the day have improved, but they are still slightly high especially in the morning. Therefore, I advised her to increase her Humulin 70/30 insulin dose before dinner. I also advised her to try not to miss the midday dose, which is now missed 2 out of 7 days.  - I congratulated her about her choice to go to  a plant-based diet, which I feel will help her achieve better control and allow [place] to decrease her insulin doses and hopefully stopping glipizide in the near future - insulin is expensive >> recommended to get it from [facility] -  I suggested to:  Patient Instructions  Please continue: - Glipizide 10 mg 2x a day before meals  Please increase Humulin - 70 units before b'fast - 15 units before lunch - 70-75 units before dinner.  Please return in 3 months with your sugar log.   - continue checking sugars at different times of the day - check 3x a day, rotating checks - advised for yearly eye exams >> she is UTD - new dx of DR - Return to clinic in 3 mo with sugar log   2. Obesity class 3 - congratulated her Re: change in diet >> advised her to continue - no wt loss since last visit, but she did not take Lasix today >> advised to take it as she gets home  Office Visit on 06/10/2017  Component Date Value Ref Range Status  . Fructosamine 06/10/2017 299* 190 - 270 umol/L Final   HbA1c calculated from fructosamine is 6.7%, higher than measured, but still at goal.  Philemon Kingdom, MD PhD [facility] Endocrinology

## 2017-06-10 NOTE — Progress Notes (Signed)
Spoke with patient, pre-meds for scan called in to patient's pharmacy

## 2017-06-10 NOTE — Telephone Encounter (Signed)
Called patient, left message to call desk rn.

## 2017-06-10 NOTE — Patient Instructions (Addendum)
Please continue: - Glipizide 10 mg 2x a day before meals  Please increase Humulin - 70 units before b'fast - 15 units before lunch - 70-75 units before dinner.  Please return in 3 months with your sugar log.

## 2017-06-11 LAB — FRUCTOSAMINE: Fructosamine: 299 umol/L — ABNORMAL HIGH (ref 190–270)

## 2017-06-14 ENCOUNTER — Encounter: Payer: Self-pay | Admitting: Family

## 2017-06-15 ENCOUNTER — Ambulatory Visit (HOSPITAL_BASED_OUTPATIENT_CLINIC_OR_DEPARTMENT_OTHER)
Admission: RE | Admit: 2017-06-15 | Discharge: 2017-06-15 | Disposition: A | Discharge status: Home / Self Care | Payer: Medicare Other | Source: Ambulatory Visit | Attending: Family | Admitting: Family

## 2017-06-15 DIAGNOSIS — I700 Atherosclerosis of aorta: Secondary | ICD-10-CM | POA: Insufficient documentation

## 2017-06-15 DIAGNOSIS — K760 Fatty (change of) liver, not elsewhere classified: Secondary | ICD-10-CM | POA: Diagnosis not present

## 2017-06-15 DIAGNOSIS — R162 Hepatomegaly with splenomegaly, not elsewhere classified: Secondary | ICD-10-CM | POA: Insufficient documentation

## 2017-06-15 DIAGNOSIS — D696 Thrombocytopenia, unspecified: Secondary | ICD-10-CM | POA: Diagnosis not present

## 2017-06-15 DIAGNOSIS — D500 Iron deficiency anemia secondary to blood loss (chronic): Secondary | ICD-10-CM | POA: Diagnosis not present

## 2017-06-15 DIAGNOSIS — K8020 Calculus of gallbladder without cholecystitis without obstruction: Secondary | ICD-10-CM | POA: Diagnosis not present

## 2017-06-16 ENCOUNTER — Ambulatory Visit (HOSPITAL_BASED_OUTPATIENT_CLINIC_OR_DEPARTMENT_OTHER): Payer: Medicare Other

## 2017-06-16 ENCOUNTER — Other Ambulatory Visit: Payer: Self-pay | Admitting: Family

## 2017-06-16 ENCOUNTER — Other Ambulatory Visit (HOSPITAL_BASED_OUTPATIENT_CLINIC_OR_DEPARTMENT_OTHER): Payer: Medicare Other

## 2017-06-16 VITALS — BP 140/42 | HR 59 | Temp 98.3°F | Resp 20

## 2017-06-16 DIAGNOSIS — D696 Thrombocytopenia, unspecified: Secondary | ICD-10-CM

## 2017-06-16 DIAGNOSIS — K760 Fatty (change of) liver, not elsewhere classified: Secondary | ICD-10-CM

## 2017-06-16 DIAGNOSIS — N184 Chronic kidney disease, stage 4 (severe): Secondary | ICD-10-CM | POA: Diagnosis not present

## 2017-06-16 DIAGNOSIS — D500 Iron deficiency anemia secondary to blood loss (chronic): Secondary | ICD-10-CM

## 2017-06-16 LAB — CBC WITH DIFFERENTIAL (CANCER CENTER ONLY)
BASO#: 0 10*3/uL (ref 0.0–0.2)
BASO%: 0.3 % (ref 0.0–2.0)
EOS%: 3.8 % (ref 0.0–7.0)
Eosinophils Absolute: 0.2 10*3/uL (ref 0.0–0.5)
HCT: 31.6 % — ABNORMAL LOW (ref 34.8–46.6)
HGB: 10.5 g/dL — ABNORMAL LOW (ref 11.6–15.9)
LYMPH#: 1.3 10*3/uL (ref 0.9–3.3)
LYMPH%: 22 % (ref 14.0–48.0)
MCH: 32.8 pg (ref 26.0–34.0)
MCHC: 33.2 g/dL (ref 32.0–36.0)
MCV: 99 fL (ref 81–101)
MONO#: 0.5 10*3/uL (ref 0.1–0.9)
MONO%: 8.4 % (ref 0.0–13.0)
NEUT#: 3.8 10*3/uL (ref 1.5–6.5)
NEUT%: 65.5 % (ref 39.6–80.0)
Platelets: 42 10*3/uL — ABNORMAL LOW (ref 145–400)
RBC: 3.2 10*6/uL — ABNORMAL LOW (ref 3.70–5.32)
RDW: 20.1 % — ABNORMAL HIGH (ref 11.1–15.7)
WBC: 5.7 10*3/uL (ref 3.9–10.0)

## 2017-06-16 LAB — CMP (CANCER CENTER ONLY)
ALT(SGPT): 23 U/L (ref 10–47)
AST: 29 U/L (ref 11–38)
Albumin: 3.7 g/dL (ref 3.3–5.5)
Alkaline Phosphatase: 44 U/L (ref 26–84)
BUN, Bld: 27 mg/dL — ABNORMAL HIGH (ref 7–22)
CO2: 31 mEq/L (ref 18–33)
Calcium: 9.4 mg/dL (ref 8.0–10.3)
Chloride: 100 mEq/L (ref 98–108)
Creat: 1.7 mg/dl — ABNORMAL HIGH (ref 0.6–1.2)
Glucose, Bld: 347 mg/dL — ABNORMAL HIGH (ref 73–118)
Potassium: 4.7 mEq/L (ref 3.3–4.7)
Sodium: 144 mEq/L (ref 128–145)
Total Bilirubin: 0.9 mg/dl (ref 0.20–1.60)
Total Protein: 7.4 g/dL (ref 6.4–8.1)

## 2017-06-16 MED ORDER — ROMIPLOSTIM INJECTION 500 MCG
345.0000 ug | Freq: Once | SUBCUTANEOUS | Status: AC
  Administered 2017-06-16: 345 ug via SUBCUTANEOUS
  Filled 2017-06-16: qty 0.69

## 2017-06-16 NOTE — Patient Instructions (Signed)
Romiplostim injection What is this medicine? ROMIPLOSTIM (roe mi PLOE stim) helps your body make more platelets. This medicine is used to treat low platelets caused by chronic idiopathic thrombocytopenic purpura (ITP). This medicine may be used for other purposes; ask your health care provider or pharmacist if you have questions. COMMON BRAND NAME(S): Nplate What should I tell my health care provider before I take this medicine? They need to know if you have any of these conditions: -cancer or myelodysplastic syndrome -low blood counts, like low white cell, platelet, or red cell counts -take medicines that treat or prevent blood clots -an unusual or allergic reaction to romiplostim, mannitol, other medicines, foods, dyes, or preservatives -pregnant or trying to get pregnant -breast-feeding How should I use this medicine? This medicine is for injection under the skin. It is given by a health care professional in a hospital or clinic setting. A special MedGuide will be given to you before your injection. Read this information carefully each time. Talk to your pediatrician regarding the use of this medicine in children. Special care may be needed. Overdosage: If you think you have taken too much of this medicine contact a poison control center or emergency room at once. NOTE: This medicine is only for you. Do not share this medicine with others. What if I miss a dose? It is important not to miss your dose. Call your doctor or health care professional if you are unable to keep an appointment. What may interact with this medicine? Interactions are not expected. This list may not describe all possible interactions. Give your health care provider a list of all the medicines, herbs, non-prescription drugs, or dietary supplements you use. Also tell them if you smoke, drink alcohol, or use illegal drugs. Some items may interact with your medicine. What should I watch for while using this  medicine? Your condition will be monitored carefully while you are receiving this medicine. Visit your prescriber or health care professional for regular checks on your progress and for the needed blood tests. It is important to keep all appointments. What side effects may I notice from receiving this medicine? Side effects that you should report to your doctor or health care professional as soon as possible: -allergic reactions like skin rash, itching or hives, swelling of the face, lips, or tongue -shortness of breath, chest pain, swelling in a leg -unusual bleeding or bruising Side effects that usually do not require medical attention (report to your doctor or health care professional if they continue or are bothersome): -dizziness -headache -muscle aches -pain in arms and legs -stomach pain -trouble sleeping This list may not describe all possible side effects. Call your doctor for medical advice about side effects. You may report side effects to FDA at 1-800-FDA-1088. Where should I keep my medicine? This drug is given in a hospital or clinic and will not be stored at home. NOTE: This sheet is a summary. It may not cover all possible information. If you have questions about this medicine, talk to your doctor, pharmacist, or health care provider.  2018 Elsevier/Gold Standard (2008-04-23 15:13:04)  

## 2017-06-16 NOTE — Progress Notes (Signed)
I went over her CT scan results in detail with both the patient and her daughter. After speaking with Dr. Marin Olp we will schedule a bone marrow biopsy to further evaluate for cause of persistent thrombocytopenia and anemia. All questions were answered and they are in agreement with the plan.

## 2017-06-21 DIAGNOSIS — R161 Splenomegaly, not elsewhere classified: Secondary | ICD-10-CM | POA: Diagnosis not present

## 2017-06-21 DIAGNOSIS — N200 Calculus of kidney: Secondary | ICD-10-CM | POA: Diagnosis not present

## 2017-06-23 ENCOUNTER — Other Ambulatory Visit (HOSPITAL_BASED_OUTPATIENT_CLINIC_OR_DEPARTMENT_OTHER): Payer: Medicare Other

## 2017-06-23 ENCOUNTER — Ambulatory Visit (HOSPITAL_BASED_OUTPATIENT_CLINIC_OR_DEPARTMENT_OTHER): Payer: Medicare Other

## 2017-06-23 VITALS — BP 188/44 | HR 66 | Temp 97.9°F | Resp 20

## 2017-06-23 DIAGNOSIS — N184 Chronic kidney disease, stage 4 (severe): Secondary | ICD-10-CM | POA: Diagnosis not present

## 2017-06-23 DIAGNOSIS — D696 Thrombocytopenia, unspecified: Secondary | ICD-10-CM

## 2017-06-23 DIAGNOSIS — D500 Iron deficiency anemia secondary to blood loss (chronic): Secondary | ICD-10-CM

## 2017-06-23 DIAGNOSIS — K760 Fatty (change of) liver, not elsewhere classified: Secondary | ICD-10-CM

## 2017-06-23 LAB — CMP (CANCER CENTER ONLY)
ALT(SGPT): 28 U/L (ref 10–47)
AST: 29 U/L (ref 11–38)
Albumin: 3.6 g/dL (ref 3.3–5.5)
Alkaline Phosphatase: 40 U/L (ref 26–84)
BUN, Bld: 25 mg/dL — ABNORMAL HIGH (ref 7–22)
CO2: 30 mEq/L (ref 18–33)
Calcium: 9.6 mg/dL (ref 8.0–10.3)
Chloride: 101 mEq/L (ref 98–108)
Creat: 1.9 mg/dl — ABNORMAL HIGH (ref 0.6–1.2)
Glucose, Bld: 269 mg/dL — ABNORMAL HIGH (ref 73–118)
Potassium: 4.9 mEq/L — ABNORMAL HIGH (ref 3.3–4.7)
Sodium: 146 mEq/L — ABNORMAL HIGH (ref 128–145)
Total Bilirubin: 0.8 mg/dl (ref 0.20–1.60)
Total Protein: 7.1 g/dL (ref 6.4–8.1)

## 2017-06-23 LAB — CBC WITH DIFFERENTIAL (CANCER CENTER ONLY)
BASO#: 0 10*3/uL (ref 0.0–0.2)
BASO%: 0.4 % (ref 0.0–2.0)
EOS%: 4 % (ref 0.0–7.0)
Eosinophils Absolute: 0.2 10*3/uL (ref 0.0–0.5)
HCT: 29.8 % — ABNORMAL LOW (ref 34.8–46.6)
HGB: 10 g/dL — ABNORMAL LOW (ref 11.6–15.9)
LYMPH#: 1 10*3/uL (ref 0.9–3.3)
LYMPH%: 21.5 % (ref 14.0–48.0)
MCH: 33.3 pg (ref 26.0–34.0)
MCHC: 33.6 g/dL (ref 32.0–36.0)
MCV: 99 fL (ref 81–101)
MONO#: 0.4 10*3/uL (ref 0.1–0.9)
MONO%: 9 % (ref 0.0–13.0)
NEUT#: 3 10*3/uL (ref 1.5–6.5)
NEUT%: 65.1 % (ref 39.6–80.0)
Platelets: 102 10*3/uL — ABNORMAL LOW (ref 145–400)
RBC: 3 10*6/uL — ABNORMAL LOW (ref 3.70–5.32)
RDW: 19.3 % — ABNORMAL HIGH (ref 11.1–15.7)
WBC: 4.6 10*3/uL (ref 3.9–10.0)

## 2017-06-23 MED ORDER — ROMIPLOSTIM INJECTION 500 MCG
345.0000 ug | Freq: Once | SUBCUTANEOUS | Status: AC
  Administered 2017-06-23: 345 ug via SUBCUTANEOUS
  Filled 2017-06-23: qty 0.69

## 2017-06-23 NOTE — Patient Instructions (Signed)
Romiplostim injection What is this medicine? ROMIPLOSTIM (roe mi PLOE stim) helps your body make more platelets. This medicine is used to treat low platelets caused by chronic idiopathic thrombocytopenic purpura (ITP). This medicine may be used for other purposes; ask your health care provider or pharmacist if you have questions. COMMON BRAND NAME(S): Nplate What should I tell my health care provider before I take this medicine? They need to know if you have any of these conditions: -cancer or myelodysplastic syndrome -low blood counts, like low white cell, platelet, or red cell counts -take medicines that treat or prevent blood clots -an unusual or allergic reaction to romiplostim, mannitol, other medicines, foods, dyes, or preservatives -pregnant or trying to get pregnant -breast-feeding How should I use this medicine? This medicine is for injection under the skin. It is given by a health care professional in a hospital or clinic setting. A special MedGuide will be given to you before your injection. Read this information carefully each time. Talk to your pediatrician regarding the use of this medicine in children. Special care may be needed. Overdosage: If you think you have taken too much of this medicine contact a poison control center or emergency room at once. NOTE: This medicine is only for you. Do not share this medicine with others. What if I miss a dose? It is important not to miss your dose. Call your doctor or health care professional if you are unable to keep an appointment. What may interact with this medicine? Interactions are not expected. This list may not describe all possible interactions. Give your health care provider a list of all the medicines, herbs, non-prescription drugs, or dietary supplements you use. Also tell them if you smoke, drink alcohol, or use illegal drugs. Some items may interact with your medicine. What should I watch for while using this  medicine? Your condition will be monitored carefully while you are receiving this medicine. Visit your prescriber or health care professional for regular checks on your progress and for the needed blood tests. It is important to keep all appointments. What side effects may I notice from receiving this medicine? Side effects that you should report to your doctor or health care professional as soon as possible: -allergic reactions like skin rash, itching or hives, swelling of the face, lips, or tongue -shortness of breath, chest pain, swelling in a leg -unusual bleeding or bruising Side effects that usually do not require medical attention (report to your doctor or health care professional if they continue or are bothersome): -dizziness -headache -muscle aches -pain in arms and legs -stomach pain -trouble sleeping This list may not describe all possible side effects. Call your doctor for medical advice about side effects. You may report side effects to FDA at 1-800-FDA-1088. Where should I keep my medicine? This drug is given in a hospital or clinic and will not be stored at home. NOTE: This sheet is a summary. It may not cover all possible information. If you have questions about this medicine, talk to your doctor, pharmacist, or health care provider.  2018 Elsevier/Gold Standard (2008-04-23 15:13:04)  

## 2017-06-25 ENCOUNTER — Other Ambulatory Visit: Payer: Self-pay | Admitting: Radiology

## 2017-06-25 ENCOUNTER — Other Ambulatory Visit: Payer: Self-pay | Admitting: Student

## 2017-06-28 ENCOUNTER — Ambulatory Visit (HOSPITAL_COMMUNITY)
Admission: RE | Admit: 2017-06-28 | Discharge: 2017-06-28 | Disposition: A | Discharge status: Home / Self Care | Payer: Medicare Other | Source: Ambulatory Visit | Attending: Family | Admitting: Family

## 2017-06-28 ENCOUNTER — Encounter (HOSPITAL_COMMUNITY): Payer: Self-pay

## 2017-06-28 DIAGNOSIS — D696 Thrombocytopenia, unspecified: Secondary | ICD-10-CM | POA: Insufficient documentation

## 2017-06-28 DIAGNOSIS — D7589 Other specified diseases of blood and blood-forming organs: Secondary | ICD-10-CM | POA: Diagnosis not present

## 2017-06-28 DIAGNOSIS — E119 Type 2 diabetes mellitus without complications: Secondary | ICD-10-CM | POA: Insufficient documentation

## 2017-06-28 DIAGNOSIS — D500 Iron deficiency anemia secondary to blood loss (chronic): Secondary | ICD-10-CM | POA: Insufficient documentation

## 2017-06-28 DIAGNOSIS — D649 Anemia, unspecified: Secondary | ICD-10-CM | POA: Diagnosis not present

## 2017-06-28 LAB — CBC WITH DIFFERENTIAL/PLATELET
Basophils Absolute: 0 K/uL (ref 0.0–0.1)
Basophils Relative: 0 %
Eosinophils Absolute: 0.1 K/uL (ref 0.0–0.7)
Eosinophils Relative: 3 %
HCT: 30.9 % — ABNORMAL LOW (ref 36.0–46.0)
Hemoglobin: 10.1 g/dL — ABNORMAL LOW (ref 12.0–15.0)
Lymphocytes Relative: 23 %
Lymphs Abs: 1.2 K/uL (ref 0.7–4.0)
MCH: 31.9 pg (ref 26.0–34.0)
MCHC: 32.7 g/dL (ref 30.0–36.0)
MCV: 97.5 fL (ref 78.0–100.0)
Monocytes Absolute: 0.4 K/uL (ref 0.1–1.0)
Monocytes Relative: 8 %
Neutro Abs: 3.4 K/uL (ref 1.7–7.7)
Neutrophils Relative %: 66 %
Platelets: 84 K/uL — ABNORMAL LOW (ref 150–400)
RBC: 3.17 MIL/uL — ABNORMAL LOW (ref 3.87–5.11)
RDW: 18.7 % — ABNORMAL HIGH (ref 11.5–15.5)
WBC: 5.2 K/uL (ref 4.0–10.5)

## 2017-06-28 LAB — BASIC METABOLIC PANEL WITH GFR
Anion gap: 12 (ref 5–15)
BUN: 26 mg/dL — ABNORMAL HIGH (ref 6–20)
CO2: 28 mmol/L (ref 22–32)
Calcium: 9.2 mg/dL (ref 8.9–10.3)
Chloride: 102 mmol/L (ref 101–111)
Creatinine, Ser: 2.09 mg/dL — ABNORMAL HIGH (ref 0.44–1.00)
GFR calc Af Amer: 25 mL/min — ABNORMAL LOW
GFR calc non Af Amer: 21 mL/min — ABNORMAL LOW
Glucose, Bld: 184 mg/dL — ABNORMAL HIGH (ref 65–99)
Potassium: 3.9 mmol/L (ref 3.5–5.1)
Sodium: 142 mmol/L (ref 135–145)

## 2017-06-28 LAB — PROTIME-INR
INR: 1.03
Prothrombin Time: 13.4 s (ref 11.4–15.2)

## 2017-06-28 LAB — GLUCOSE, CAPILLARY: Glucose-Capillary: 155 mg/dL — ABNORMAL HIGH (ref 65–99)

## 2017-06-28 MED ORDER — NALOXONE HCL 0.4 MG/ML IJ SOLN
INTRAMUSCULAR | Status: AC
  Filled 2017-06-28: qty 1

## 2017-06-28 MED ORDER — FENTANYL CITRATE (PF) 100 MCG/2ML IJ SOLN
INTRAMUSCULAR | Status: AC
  Filled 2017-06-28: qty 4

## 2017-06-28 MED ORDER — FLUMAZENIL 0.5 MG/5ML IV SOLN
INTRAVENOUS | Status: AC
  Filled 2017-06-28: qty 5

## 2017-06-28 MED ORDER — SODIUM CHLORIDE 0.9 % IV SOLN
INTRAVENOUS | Status: DC
  Administered 2017-06-28: 08:00:00 via INTRAVENOUS

## 2017-06-28 MED ORDER — FENTANYL CITRATE (PF) 100 MCG/2ML IJ SOLN
INTRAMUSCULAR | Status: AC | PRN
  Administered 2017-06-28: 25 ug via INTRAVENOUS
  Administered 2017-06-28: 50 ug via INTRAVENOUS

## 2017-06-28 MED ORDER — MIDAZOLAM HCL 2 MG/2ML IJ SOLN
INTRAMUSCULAR | Status: AC
  Filled 2017-06-28: qty 4

## 2017-06-28 MED ORDER — MIDAZOLAM HCL 2 MG/2ML IJ SOLN
INTRAMUSCULAR | Status: AC | PRN
  Administered 2017-06-28: 0.5 mg via INTRAVENOUS
  Administered 2017-06-28: 1 mg via INTRAVENOUS

## 2017-06-28 NOTE — Consult Note (Signed)
Chief Complaint: Patient was seen in consultation today for CT guided bone marrow biopsy  Referring Physician(s): [place],Sarah M/Ennever.P  Supervising Physician: Sandi Mariscal  Patient Status: [facility] - Out-pt  History of Present Illness: [name] is a 82 y.o. female with prior history of PE and now with persistent anemia/thrombocytopenia as well as hepatosplenomegaly. She presents today for CT-guided bone marrow biopsy for further evaluation. Additional history as below.  Past Medical History:  Diagnosis Date  . Allergic rhinitis   . Anxiety state, unspecified    panic attacks  . CHF (congestive heart failure) ([place])   . Depressive disorder, not elsewhere classified   . Extrinsic asthma, unspecified    no problem since adulthood  . Obesity   . OSA on CPAP    severe  . Pure hypercholesterolemia   . Respiratory failure with hypoxia ([place]) 09/2008   acute, secondary to multiple bilateral pulmonary embolism , negative hypercoagulable workup 09/2008 hospital stay  . Scoliosis   . Syncope and collapse   . Type II or unspecified type diabetes mellitus without mention of complication, not stated as uncontrolled   . Unspecified essential hypertension   . Unspecified hypothyroidism    hypo    Past Surgical History:  Procedure Laterality Date  . EYE SURGERY    . JOINT REPLACEMENT    . knee replaced    . THYROID SURGERY      Allergies: Metformin; Pineapple; Tetracycline; Aspirin; Contrast media [iodinated diagnostic agents]; Flagyl [metronidazole]; Fluticasone-salmeterol; Lactose intolerance (gi); Latex; Lisinopril; and Sulfonamide derivatives  Medications: Prior to Admission medications   Medication Sig Start Date End Date Taking? Authorizing Provider  BIOTIN PO Take 1 tablet by mouth daily.   Yes [provider]  Calcium Carbonate-Vit D-Min (CALCIUM 1200 PO) Take 1 tablet by mouth daily.   Yes [provider]  Cholecalciferol (VITAMIN D3) 2000  units TABS Take 2,000 Units by mouth daily.   Yes [provider]  docusate sodium (COLACE) 100 MG capsule Take 1 capsule by mouth daily. 04/30/15  Yes [provider]  fexofenadine (ALLEGRA) 180 MG tablet Take 180 mg by mouth daily.     Yes [provider]  furosemide (LASIX) 20 MG tablet Take 1 tablet (20 mg total) by mouth daily as needed. 06/08/17  Yes Jerline Pain, MD  glipiZIDE (GLUCOTROL) 10 MG tablet Take 10 mg by mouth 2 (two) times daily before a meal.   Yes [provider]  insulin NPH-regular Human (HUMULIN 70/30) (70-30) 100 UNIT/ML injection Inject 70 units under the skin in the morning, 15 units at noon and 65 units every evening. 03/13/15  Yes [provider]  levothyroxine (SYNTHROID, LEVOTHROID) 112 MCG tablet Take 1 tablet (112 mcg total) by mouth daily. 12/15/16  Yes Nafziger, Tommi Rumps, NP  Multiple Vitamin (MULTIVITAMIN) tablet Take 1 tablet by mouth daily.     Yes [provider]  potassium chloride SA (K-DUR,KLOR-CON) 20 MEQ tablet Take 20 mEq by mouth 2 (two) times daily.   Yes [provider]  Probiotic Product (PROBIOTIC PO) Take 1 capsule by mouth daily.   Yes [provider]  ranitidine (ZANTAC) 150 MG tablet Take 150 mg by mouth daily as needed for heartburn.   Yes [provider]  sertraline (ZOLOFT) 50 MG tablet Take 1 tablet (50 mg total) by mouth daily. 12/15/16  Yes Nafziger, Tommi Rumps, NP  atorvastatin (LIPITOR) 40 MG tablet Take 40 mg by mouth daily.    [provider]  Coenzyme Q10 200 MG capsule Take 200 mg by mouth daily.      [provider]  diphenhydrAMINE (BENADRYL) 50 MG tablet Take 1 tablet (50 mg total) by mouth once. 1 hour before scan. 06/10/17 06/10/17  [place], Holli Humbles, NP  fenofibrate 54 MG tablet TAKE 1 TABLET(54 MG) BY MOUTH DAILY 04/28/17   Nafziger, Tommi Rumps, NP  Insulin Pen Needle (B-D UF III MINI PEN NEEDLES) 31G X 5 MM MISC Use daily for insulin injection 12/15/16    Nafziger, Tommi Rumps, NP  Insulin Syringe-Needle U-100 (INSULIN SYRINGE 1CC/30GX5/16") 30G X 5/16" 1 ML MISC USE BID UTD 01/27/17   [provider]  predniSONE (DELTASONE) 50 MG tablet Take one tablet 13 hours prior to, 7 hours prior to and 1 hour prior to CT scan. 06/10/17   [place], Holli Humbles, NP  vitamin C (ASCORBIC ACID) 250 MG tablet Take 250 mg by mouth daily.    [provider]     Family History  Problem Relation Age of Onset  . Allergies Mother   . Clotting disorder Mother   . Osteoarthritis Mother   . Asthma Mother   . Arthritis Other   . Diabetes Other   . Hyperlipidemia Other   . Hypertension Other   . Coronary artery disease Other   . Stroke Other   . Osteoarthritis Daughter   . Rheum arthritis Maternal Grandmother   . Clotting disorder Maternal Grandmother   . Clotting disorder Maternal Uncle   . Clotting disorder Daughter   . Allergies Daughter   . Breast cancer Neg Hx     Social History   Social History  . Marital status: Widowed    Spouse name: N/A  . Number of children: 2  . Years of education: N/A   Occupational History  . 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 Smoking status: Former Smoker    Years: 20.00    Quit date: 09/07/1989  . Smokeless tobacco: Never Used  . Alcohol use No  . Drug use: No  . Sexual activity: Not Asked   Other Topics Concern  . None   Social History Narrative  . None      Review of Systems  denies fever, headache, chest pain, dyspnea, cough, abdominal/back pain, nausea, vomiting or bleeding Vital Signs: BP (!) 162/69 (BP Location: Left Arm)   Pulse 80   Temp 97.9 F (36.6 C) (Oral)   Resp 18   Ht '5\' 2"'$  (1.575 m)   Wt 245 lb (111.1 kg)   LMP  (LMP Unknown)   SpO2 95%   BMI 44.81 kg/m   Physical Exam awake, alert. Chest clear to auscultation bilaterally. Heart with normal rate, occasional ectopy noted. Abdomen obese, soft, positive bowel sounds,  nontender. Lower extremitIes with edema and changes of venous stasis bilaterally  Imaging: Ct Abdomen Wo Contrast  Result Date: 06/15/2017 CLINICAL DATA:  Fatty liver. Possible cirrhosis. Thrombocytopenia. Evaluate liver and spleen. Elevated GFR. EXAM: CT ABDOMEN WITHOUT CONTRAST TECHNIQUE: Multidetector CT imaging of the abdomen was performed following the standard protocol without IV contrast. COMPARISON:  10/30/2008 and the abdominal ultrasound of 01/19/2017. FINDINGS: Mild degradation secondary patient body habitus and lack of IV contrast. Lower chest: Mild cardiomegaly, without pericardial or pleural effusion. Hepatobiliary: Mild hepatomegaly 18.3 cm craniocaudal. Mild hepatic steatosis. This is heterogeneous. Left hepatic lobe calcification is likely related to old granulomatous disease. Suspect a cyst or more focal steatosis in  the posterior aspect of segment 4 of the liver. Multiple small gallstones without acute cholecystitis or biliary duct dilatation. Pancreas: Fatty atrophy involving the pancreatic body and head. No duct dilatation. Spleen: splenomegaly.  14.4 x 13.1 x 9.5 cm (volume = 940 cm^3). Adrenals/Urinary Tract: Normal adrenal glands. Mild renal cortical thinning bilaterally. Interpolar left renal cyst of approximately 2.5 cm. No renal calculi or hydronephrosis. Stomach/Bowel: Normal stomach, without wall thickening. Normal abdominal bowel loops. Vascular/Lymphatic: Aortic and branch vessel atherosclerosis. No specific evidence of portal venous hypertension. No retroperitoneal or retrocrural adenopathy. Other: No ascites. Musculoskeletal: Thoracolumbar spondylosis with convex left spinal curvature. IMPRESSION: 1. Hepatosplenomegaly. 2. Mild heterogeneous hepatic steatosis.  No evidence of cirrhosis. 3. Cholelithiasis. 4.  Aortic Atherosclerosis (ICD10-I70.0). Electronically Signed   By: Abigail Miyamoto M.D.   On: 06/15/2017 15:34    Labs:  CBC:  Recent Labs  06/09/17 1120  06/16/17 1147 06/23/17 1142 06/28/17 0734  WBC 4.4 5.7 4.6 5.2  HGB 9.8* 10.5* 10.0* 10.1*  HCT 30.0* 31.6* 29.8* 30.9*  PLT 34 Platelet count consistent in citrate* 42 Platelet count consistent in citrate* 102 Platelet count consistent in citrate* PENDING    COAGS:  Recent Labs  06/28/17 0734  INR 1.03    BMP:  Recent Labs  01/26/17 1303 02/02/17 1306 02/09/17 1305  04/23/17 1336  06/01/17 1120 06/09/17 1120 06/16/17 1147 06/23/17 1142  NA 138 135 138  < > 139  < > 141 146* 144 146*  K 4.7 4.8 4.4  < > 5.6*  < > 4.7 4.8* 4.7 4.9*  CL 97 100 101  < > 106  < > 97* 103 100 101  CO2 21 31* 27  < > 25  < > '30 29 31 30  '$ GLUCOSE 290* 268* 282*  < > 325*  < > 264* 290* 347* 269*  BUN 30* 38* 32*  < > 27  < > 39* 30* 27* 25*  CALCIUM 10.1 10.0 8.5*  < > 8.8  < > 9.7 10.0 9.4 9.6  CREATININE 1.54* 1.73* 1.72*  < > 1.67*  < > 2.4* 1.9* 1.7* 1.9*  GFRNONAA 32* 28* 28*  --  29*  --   --   --   --   --   GFRAA 37* 32* 32*  --  33*  --   --   --   --   --   < > = values in this interval not displayed.  LIVER FUNCTION TESTS:  Recent Labs  06/01/17 1120 06/09/17 1120 06/16/17 1147 06/23/17 1142  BILITOT 1.00 0.80 0.90 0.80  AST '31 27 29 29  '$ ALT '29 28 23 28  '$ ALKPHOS 47 44 44 40  PROT 7.4 7.1 7.4 7.1  ALBUMIN 3.6 3.6 3.7 3.6    TUMOR MARKERS: No results for input(s): AFPTM, CEA, CA199, CHROMGRNA in the last 8760 hours.  Assessment and Plan: 82 y.o. female with prior history of PE and now with persistent anemia/thrombocytopenia as well as hepatosplenomegaly. She presents today for CT-guided bone marrow biopsy for further evaluation.Risks and benefits discussed with the patient/daughter including, but not limited to bleeding, infection, damage to adjacent structures or low yield requiring additional tests.All of the patient's questions were answered, patient is agreeable to proceed.Consent signed and in chart.     Thank you for this interesting consult.  I greatly  enjoyed meeting [name] and look forward to participating in their care.  A copy of this report was sent to the  requesting provider on this date.  Electronically Signed: D. Rowe Robert, PA-C 06/28/2017, 8:37 AM   I spent a total of 25 minutes   in face to face in clinical consultation, greater than 50% of which was counseling/coordinating care for CT-guided bone marrow biopsy

## 2017-06-28 NOTE — Sedation Documentation (Signed)
Patient denies pain and is resting comfortably.  

## 2017-06-28 NOTE — Procedures (Signed)
Pre-procedure Diagnosis: Anemia and thrombocytopenia Post-procedure Diagnosis: Same  Technically successful CT guided bone marrow aspiration and biopsy of left iliac crest.   Complications: None Immediate  EBL: None  SignedSandi Mariscal Pager: [phone] 06/28/2017, 10:08 AM

## 2017-06-28 NOTE — Discharge Instructions (Signed)
Moderate Conscious Sedation, Adult, Care After °These instructions provide you with information about caring for yourself after your procedure. Your health care provider may also give you more specific instructions. Your treatment has been planned according to current medical practices, but problems sometimes occur. Call your health care provider if you have any problems or questions after your procedure. °What can I expect after the procedure? °After your procedure, it is common: °· To feel sleepy for several hours. °· To feel clumsy and have poor balance for several hours. °· To have poor judgment for several hours. °· To vomit if you eat too soon. ° °Follow these instructions at home: °For at least 24 hours after the procedure: ° °· Do not: °? Participate in activities where you could fall or become injured. °? Drive. °? Use heavy machinery. °? Drink alcohol. °? Take sleeping pills or medicines that cause drowsiness. °? Make important decisions or sign legal documents. °? Take care of children on your own. °· Rest. °Eating and drinking °· Follow the diet recommended by your health care provider. °· If you vomit: °? Drink water, juice, or soup when you can drink without vomiting. °? Make sure you have little or no nausea before eating solid foods. °General instructions °· Have a responsible adult stay with you until you are awake and alert. °· Take over-the-counter and prescription medicines only as told by your health care provider. °· If you smoke, do not smoke without supervision. °· Keep all follow-up visits as told by your health care provider. This is important. °Contact a health care provider if: °· You keep feeling nauseous or you keep vomiting. °· You feel light-headed. °· You develop a rash. °· You have a fever. °Get help right away if: °· You have trouble breathing. °This information is not intended to replace advice given to you by your health care provider. Make sure you discuss any questions you have  with your health care provider. °Document Released: 06/14/2013 Document Revised: 01/27/2016 Document Reviewed: 12/14/2015 °Elsevier Interactive Patient Education © 2018 [employer] Inc[employer] °Bone Marrow Aspiration and Bone Marrow Biopsy, Adult, Care After °This sheet gives you information about how to care for yourself after your procedure. Your health care provider may also give you more specific instructions. If you have problems or questions, contact your health care provider. °What can I expect after the procedure? °After the procedure, it is common to have: °· Mild pain and tenderness. °· Swelling. °· Bruising. ° °Follow these instructions at home: °· Take over-the-counter or prescription medicines only as told by your health care provider. °· Do not take baths, swim, or use a hot tub until your health care provider approves. Ask if you can take a shower or have a sponge bath. °· Follow instructions from your health care provider about how to take care of the puncture site. Make sure you: °? Wash your hands with soap and water before you change your bandage (dressing). If soap and water are not available, use hand sanitizer. °? Change your dressing as told by your health care provider. °· Check your puncture site every day for signs of infection. Check for: °? More redness, swelling, or pain. °? More fluid or blood. °? Warmth. °? Pus or a bad smell. °· Return to your normal activities as told by your health care provider. Ask your health care provider what activities are safe for you. °· Do not drive for 24 hours if you were given a medicine to help you relax (sedative). °·   Keep all follow-up visits as told by your health care provider. This is important. °Contact a health care provider if: °· You have more redness, swelling, or pain around the puncture site. °· You have more fluid or blood coming from the puncture site. °· Your puncture site feels warm to the touch. °· You have pus or a bad smell coming from the  puncture site. °· You have a fever. °· Your pain is not controlled with medicine. °This information is not intended to replace advice given to you by your health care provider. Make sure you discuss any questions you have with your health care provider. °Document Released: 03/13/2005 Document Revised: 03/13/2016 Document Reviewed: 02/05/2016 °Elsevier Interactive Patient Education © 2018 Elsevier Inc. ° °

## 2017-06-30 ENCOUNTER — Other Ambulatory Visit (HOSPITAL_BASED_OUTPATIENT_CLINIC_OR_DEPARTMENT_OTHER): Payer: Medicare Other

## 2017-06-30 ENCOUNTER — Ambulatory Visit (HOSPITAL_BASED_OUTPATIENT_CLINIC_OR_DEPARTMENT_OTHER): Payer: Medicare Other

## 2017-06-30 ENCOUNTER — Ambulatory Visit (HOSPITAL_BASED_OUTPATIENT_CLINIC_OR_DEPARTMENT_OTHER): Payer: Medicare Other | Admitting: Family

## 2017-06-30 VITALS — BP 177/51 | HR 81 | Temp 98.1°F | Resp 18 | Wt 246.4 lb

## 2017-06-30 DIAGNOSIS — D500 Iron deficiency anemia secondary to blood loss (chronic): Secondary | ICD-10-CM

## 2017-06-30 DIAGNOSIS — K760 Fatty (change of) liver, not elsewhere classified: Secondary | ICD-10-CM | POA: Diagnosis not present

## 2017-06-30 DIAGNOSIS — D696 Thrombocytopenia, unspecified: Secondary | ICD-10-CM

## 2017-06-30 DIAGNOSIS — Z86711 Personal history of pulmonary embolism: Secondary | ICD-10-CM | POA: Diagnosis not present

## 2017-06-30 DIAGNOSIS — N184 Chronic kidney disease, stage 4 (severe): Secondary | ICD-10-CM

## 2017-06-30 LAB — IRON AND TIBC
%SAT: 20 % — ABNORMAL LOW (ref 21–57)
Iron: 78 ug/dL (ref 41–142)
TIBC: 394 ug/dL (ref 236–444)
UIBC: 316 ug/dL (ref 120–384)

## 2017-06-30 LAB — CBC WITH DIFFERENTIAL (CANCER CENTER ONLY)
BASO#: 0 10*3/uL (ref 0.0–0.2)
BASO%: 0.2 % (ref 0.0–2.0)
EOS%: 3.6 % (ref 0.0–7.0)
Eosinophils Absolute: 0.2 10*3/uL (ref 0.0–0.5)
HCT: 32.8 % — ABNORMAL LOW (ref 34.8–46.6)
HGB: 11.1 g/dL — ABNORMAL LOW (ref 11.6–15.9)
LYMPH#: 1.3 10*3/uL (ref 0.9–3.3)
LYMPH%: 21.5 % (ref 14.0–48.0)
MCH: 33.3 pg (ref 26.0–34.0)
MCHC: 33.8 g/dL (ref 32.0–36.0)
MCV: 99 fL (ref 81–101)
MONO#: 0.4 10*3/uL (ref 0.1–0.9)
MONO%: 6.9 % (ref 0.0–13.0)
NEUT#: 4.1 10*3/uL (ref 1.5–6.5)
NEUT%: 67.8 % (ref 39.6–80.0)
Platelets: 166 10*3/uL (ref 145–400)
RBC: 3.33 10*6/uL — ABNORMAL LOW (ref 3.70–5.32)
RDW: 18.5 % — ABNORMAL HIGH (ref 11.1–15.7)
WBC: 6.1 10*3/uL (ref 3.9–10.0)

## 2017-06-30 LAB — CMP (CANCER CENTER ONLY)
ALT(SGPT): 23 U/L (ref 10–47)
AST: 31 U/L (ref 11–38)
Albumin: 3.8 g/dL (ref 3.3–5.5)
Alkaline Phosphatase: 45 U/L (ref 26–84)
BUN, Bld: 27 mg/dL — ABNORMAL HIGH (ref 7–22)
CO2: 32 mEq/L (ref 18–33)
Calcium: 9.4 mg/dL (ref 8.0–10.3)
Chloride: 99 mEq/L (ref 98–108)
Creat: 2.1 mg/dl — ABNORMAL HIGH (ref 0.6–1.2)
Glucose, Bld: 256 mg/dL — ABNORMAL HIGH (ref 73–118)
Potassium: 4 mEq/L (ref 3.3–4.7)
Sodium: 146 mEq/L — ABNORMAL HIGH (ref 128–145)
Total Bilirubin: 0.9 mg/dl (ref 0.20–1.60)
Total Protein: 7.6 g/dL (ref 6.4–8.1)

## 2017-06-30 LAB — FERRITIN: Ferritin: 122 ng/ml (ref 9–269)

## 2017-06-30 MED ORDER — ROMIPLOSTIM INJECTION 500 MCG
3.0000 ug/kg | Freq: Once | SUBCUTANEOUS | Status: AC
  Administered 2017-06-30: 335 ug via SUBCUTANEOUS
  Filled 2017-06-30: qty 0.67

## 2017-06-30 NOTE — Patient Instructions (Signed)
Romiplostim injection What is this medicine? ROMIPLOSTIM (roe mi PLOE stim) helps your body make more platelets. This medicine is used to treat low platelets caused by chronic idiopathic thrombocytopenic purpura (ITP). This medicine may be used for other purposes; ask your health care provider or pharmacist if you have questions. COMMON BRAND NAME(S): Nplate What should I tell my health care provider before I take this medicine? They need to know if you have any of these conditions: -cancer or myelodysplastic syndrome -low blood counts, like low white cell, platelet, or red cell counts -take medicines that treat or prevent blood clots -an unusual or allergic reaction to romiplostim, mannitol, other medicines, foods, dyes, or preservatives -pregnant or trying to get pregnant -breast-feeding How should I use this medicine? This medicine is for injection under the skin. It is given by a health care professional in a hospital or clinic setting. A special MedGuide will be given to you before your injection. Read this information carefully each time. Talk to your pediatrician regarding the use of this medicine in children. Special care may be needed. Overdosage: If you think you have taken too much of this medicine contact a poison control center or emergency room at once. NOTE: This medicine is only for you. Do not share this medicine with others. What if I miss a dose? It is important not to miss your dose. Call your doctor or health care professional if you are unable to keep an appointment. What may interact with this medicine? Interactions are not expected. This list may not describe all possible interactions. Give your health care provider a list of all the medicines, herbs, non-prescription drugs, or dietary supplements you use. Also tell them if you smoke, drink alcohol, or use illegal drugs. Some items may interact with your medicine. What should I watch for while using this  medicine? Your condition will be monitored carefully while you are receiving this medicine. Visit your prescriber or health care professional for regular checks on your progress and for the needed blood tests. It is important to keep all appointments. What side effects may I notice from receiving this medicine? Side effects that you should report to your doctor or health care professional as soon as possible: -allergic reactions like skin rash, itching or hives, swelling of the face, lips, or tongue -shortness of breath, chest pain, swelling in a leg -unusual bleeding or bruising Side effects that usually do not require medical attention (report to your doctor or health care professional if they continue or are bothersome): -dizziness -headache -muscle aches -pain in arms and legs -stomach pain -trouble sleeping This list may not describe all possible side effects. Call your doctor for medical advice about side effects. You may report side effects to FDA at 1-800-FDA-1088. Where should I keep my medicine? This drug is given in a hospital or clinic and will not be stored at home. NOTE: This sheet is a summary. It may not cover all possible information. If you have questions about this medicine, talk to your doctor, pharmacist, or health care provider.  2018 Elsevier/Gold Standard (2008-04-23 15:13:04)  

## 2017-06-30 NOTE — Progress Notes (Signed)
Hematology and Oncology Follow Up Visit  [name] [id number] Mar 14, 1935 82 y.o. 06/30/2017   Principle Diagnosis:  Thrombocytopenia History of PE  Current Therapy:   Nplate as indicated    Interim History:  [name] is here today with her daughter for follow-up. She states that she is feeling better and denies fatigue at this time.  She had a bone marrow biopsy earlier this week and pathology showed hypercellular bone marrow for her age with erythroid hyperplasia. Cytology is pending.  No episodes of bleeding, bruising or petechiae. Platelet count today is now 166. Hgb is up to 11.1.  No fever, chills, n/v, cough, rash, dizziness, SOB, chest pain, palpitations, abdominal pain or changes in bowel or bladder habits.  No numbness or tingling in her extremities. The puffiness in her feet and ankles comes and goes. This improves with elevating her feet.  She has maintained a good appetite and is staying well hydrated. Her weight is stable.   ECOG Performance Status: 1 - Symptomatic but completely ambulatory  Medications:  Allergies as of 06/30/2017      Reactions   Metformin Diarrhea   Pineapple Swelling   Throat swells and blisters on tongue and roof of mouth per patient   Tetracycline Hives   Aspirin Itching, Rash   Contrast Media [iodinated Diagnostic Agents] Itching      Flagyl [metronidazole] Other (See Comments)   Unknown   Fluticasone-salmeterol Itching, Rash   Lactose Intolerance (gi) Other (See Comments)   gas   Latex Itching, Rash   Lisinopril Cough   Sulfonamide Derivatives Itching, Rash      Medication List       Accurate as of 06/30/17 10:15 PM. Always use your most recent med list.          atorvastatin 40 MG tablet Commonly known as:  LIPITOR Take 40 mg by mouth daily.   BIOTIN PO Take 1 tablet by mouth daily.   CALCIUM 1200 PO Take 1 tablet by mouth daily.   Coenzyme Q10 200 MG capsule Take 200 mg by mouth daily.   docusate sodium  100 MG capsule Commonly known as:  COLACE Take 1 capsule by mouth daily.   fenofibrate 54 MG tablet TAKE 1 TABLET(54 MG) BY MOUTH DAILY   fexofenadine 180 MG tablet Commonly known as:  ALLEGRA Take 180 mg by mouth daily.   furosemide 20 MG tablet Commonly known as:  LASIX Take 1 tablet (20 mg total) by mouth daily as needed.   glipiZIDE 10 MG tablet Commonly known as:  GLUCOTROL Take 10 mg by mouth 2 (two) times daily before a meal.   HUMULIN 70/30 (70-30) 100 UNIT/ML injection Generic drug:  insulin NPH-regular Human Inject 70 units under the skin in the morning, 15 units at noon and 65 units every evening.   Insulin Pen Needle 31G X 5 MM Misc Commonly known as:  B-D UF III MINI PEN NEEDLES Use daily for insulin injection   INSULIN SYRINGE 1CC/30GX5/16" 30G X 5/16" 1 ML Misc USE BID UTD   levothyroxine 112 MCG tablet Commonly known as:  SYNTHROID, LEVOTHROID Take 1 tablet (112 mcg total) by mouth daily.   multivitamin tablet Take 1 tablet by mouth daily.   potassium chloride SA 20 MEQ tablet Commonly known as:  K-DUR,KLOR-CON Take 20 mEq by mouth 2 (two) times daily.   PROBIOTIC PO Take 1 capsule by mouth daily.   ranitidine 150 MG tablet Commonly known as:  ZANTAC Take 150 mg by  mouth daily as needed for heartburn.   sertraline 50 MG tablet Commonly known as:  ZOLOFT Take 1 tablet (50 mg total) by mouth daily.   vitamin C 250 MG tablet Commonly known as:  ASCORBIC ACID Take 250 mg by mouth daily.   Vitamin D3 2000 units Tabs Take 2,000 Units by mouth daily.       Allergies:  Allergies  Allergen Reactions  . Metformin Diarrhea  . Pineapple Swelling    Throat swells and blisters on tongue and roof of mouth per patient  . Tetracycline Hives  . Aspirin Itching and Rash  . Contrast Media [Iodinated Diagnostic Agents] Itching       . Flagyl [Metronidazole] Other (See Comments)    Unknown   . Fluticasone-Salmeterol Itching and Rash  . Lactose  Intolerance (Gi) Other (See Comments)    gas  . Latex Itching and Rash  . Lisinopril Cough  . Sulfonamide Derivatives Itching and Rash    Past Medical History, Surgical history, Social history, and Family History were reviewed and updated.  Review of Systems: All other 10 point review of systems is negative.   Physical Exam:  weight is 246 lb 6.4 oz (111.8 kg). Her oral temperature is 98.1 F (36.7 C). Her blood pressure is 177/51 (abnormal) and her pulse is 81. Her respiration is 18 and oxygen saturation is 95%.   Wt Readings from Last 3 Encounters:  06/30/17 246 lb 6.4 oz (111.8 kg)  06/28/17 245 lb (111.1 kg)  06/10/17 249 lb (112.9 kg)    Ocular: Sclerae unicteric, pupils equal, round and reactive to light Ear-nose-throat: Oropharynx clear, dentition fair Lymphatic: No cervical, supraclavicular or axillary adenopathy Lungs no rales or rhonchi, good excursion bilaterally Heart regular rate and rhythm, no murmur appreciated Abd soft, nontender, positive bowel sounds, no liver or spleen tip palpated on exam, no fluid wave MSK no focal spinal tenderness, no joint edema Neuro: non-focal, well-oriented, appropriate affect Breasts: Deferred   Lab Results  Component Value Date   WBC 6.1 06/30/2017   HGB 11.1 (L) 06/30/2017   HCT 32.8 (L) 06/30/2017   MCV 99 06/30/2017   PLT 166 Platelet count consistent in citrate 06/30/2017   Lab Results  Component Value Date   FERRITIN 122 06/30/2017   IRON 78 06/30/2017   TIBC 394 06/30/2017   UIBC 316 06/30/2017   IRONPCTSAT 20 (L) 06/30/2017   Lab Results  Component Value Date   RETICCTPCT 1.9 01/28/2011   RBC 3.33 (L) 06/30/2017   RETICCTABS 73.3 01/28/2011   No results found for: KPAFRELGTCHN, LAMBDASER, KAPLAMBRATIO No results found for: IGGSERUM, IGA, IGMSERUM No results found for: Kathrynn Ducking, MSPIKE, SPEI   Chemistry      Component Value Date/Time   NA 146 (H) 06/30/2017  1109   NA 142 02/25/2017 1013   K 4.0 06/30/2017 1109   K 3.8 02/25/2017 1013   CL 99 06/30/2017 1109   CO2 32 06/30/2017 1109   CO2 26 02/25/2017 1013   BUN 27 (H) 06/30/2017 1109   BUN 20.1 02/25/2017 1013   CREATININE 2.1 (H) 06/30/2017 1109   CREATININE 1.8 (H) 02/25/2017 1013      Component Value Date/Time   CALCIUM 9.4 06/30/2017 1109   CALCIUM 8.7 02/25/2017 1013   ALKPHOS 45 06/30/2017 1109   ALKPHOS 65 02/25/2017 1013   AST 31 06/30/2017 1109   AST 16 02/25/2017 1013   ALT 23 06/30/2017 1109   ALT 13 02/25/2017  1013   BILITOT 0.90 06/30/2017 1109   BILITOT 0.76 02/25/2017 1013      Impression and Plan: [name] is a very pleasant 82 yo caucasian female with history of PE as well as anemia secondary to GI blood loss while an anticoagulation. She has also developed thrombocytopenia. She has responded nicely to Nplate and platelet count today is now 166.   We will proceed with her injection today as planned. She will continue to come in weekly for lab and injection if needed.  Cytology report from [facility] is pending.  We will see what her iron studies show and bring her back in later this week for an infusion if needed.  We will plan to see her back again in another month for follow-up.  Both she and her daughter know to contact our office with any questions or concerns. We can certainly see her sooner if need be.   Eliezer Bottom, NP 10/24/201810:15 PM

## 2017-07-01 LAB — RETICULOCYTES: Reticulocyte Count: 4.1 % — ABNORMAL HIGH (ref 0.6–2.6)

## 2017-07-06 ENCOUNTER — Other Ambulatory Visit: Payer: Self-pay | Admitting: *Deleted

## 2017-07-06 DIAGNOSIS — D696 Thrombocytopenia, unspecified: Secondary | ICD-10-CM

## 2017-07-07 ENCOUNTER — Other Ambulatory Visit (HOSPITAL_BASED_OUTPATIENT_CLINIC_OR_DEPARTMENT_OTHER): Payer: Medicare Other

## 2017-07-07 ENCOUNTER — Ambulatory Visit: Payer: Medicare Other

## 2017-07-07 ENCOUNTER — Ambulatory Visit (HOSPITAL_BASED_OUTPATIENT_CLINIC_OR_DEPARTMENT_OTHER): Payer: Medicare Other

## 2017-07-07 VITALS — BP 142/60 | HR 81 | Temp 98.7°F | Resp 18

## 2017-07-07 DIAGNOSIS — D696 Thrombocytopenia, unspecified: Secondary | ICD-10-CM

## 2017-07-07 DIAGNOSIS — K922 Gastrointestinal hemorrhage, unspecified: Secondary | ICD-10-CM | POA: Diagnosis not present

## 2017-07-07 DIAGNOSIS — D509 Iron deficiency anemia, unspecified: Secondary | ICD-10-CM | POA: Diagnosis not present

## 2017-07-07 DIAGNOSIS — N184 Chronic kidney disease, stage 4 (severe): Secondary | ICD-10-CM

## 2017-07-07 LAB — CBC WITH DIFFERENTIAL (CANCER CENTER ONLY)
BASO#: 0 10*3/uL (ref 0.0–0.2)
BASO%: 0.3 % (ref 0.0–2.0)
EOS%: 3.6 % (ref 0.0–7.0)
Eosinophils Absolute: 0.2 10*3/uL (ref 0.0–0.5)
HCT: 33.3 % — ABNORMAL LOW (ref 34.8–46.6)
HGB: 11 g/dL — ABNORMAL LOW (ref 11.6–15.9)
LYMPH#: 1.3 10*3/uL (ref 0.9–3.3)
LYMPH%: 22.7 % (ref 14.0–48.0)
MCH: 32.7 pg (ref 26.0–34.0)
MCHC: 33 g/dL (ref 32.0–36.0)
MCV: 99 fL (ref 81–101)
MONO#: 0.5 10*3/uL (ref 0.1–0.9)
MONO%: 8.1 % (ref 0.0–13.0)
NEUT#: 3.8 10*3/uL (ref 1.5–6.5)
NEUT%: 65.3 % (ref 39.6–80.0)
Platelets: 76 10*3/uL — ABNORMAL LOW (ref 145–400)
RBC: 3.36 10*6/uL — ABNORMAL LOW (ref 3.70–5.32)
RDW: 17.8 % — ABNORMAL HIGH (ref 11.1–15.7)
WBC: 5.8 10*3/uL (ref 3.9–10.0)

## 2017-07-07 LAB — CMP (CANCER CENTER ONLY)
ALT(SGPT): 26 U/L (ref 10–47)
AST: 32 U/L (ref 11–38)
Albumin: 3.8 g/dL (ref 3.3–5.5)
Alkaline Phosphatase: 45 U/L (ref 26–84)
BUN, Bld: 36 mg/dL — ABNORMAL HIGH (ref 7–22)
CO2: 31 mEq/L (ref 18–33)
Calcium: 9.9 mg/dL (ref 8.0–10.3)
Chloride: 99 mEq/L (ref 98–108)
Creat: 2.4 mg/dl — ABNORMAL HIGH (ref 0.6–1.2)
Glucose, Bld: 269 mg/dL — ABNORMAL HIGH (ref 73–118)
Potassium: 4.7 mEq/L (ref 3.3–4.7)
Sodium: 149 mEq/L — ABNORMAL HIGH (ref 128–145)
Total Bilirubin: 0.8 mg/dl (ref 0.20–1.60)
Total Protein: 7.4 g/dL (ref 6.4–8.1)

## 2017-07-07 MED ORDER — SODIUM CHLORIDE 0.9 % IV SOLN
510.0000 mg | Freq: Once | INTRAVENOUS | Status: AC
  Administered 2017-07-07: 510 mg via INTRAVENOUS
  Filled 2017-07-07: qty 17

## 2017-07-07 MED ORDER — ROMIPLOSTIM INJECTION 500 MCG
3.0000 ug/kg | Freq: Once | SUBCUTANEOUS | Status: AC
  Administered 2017-07-07: 335 ug via SUBCUTANEOUS
  Filled 2017-07-07: qty 0.67

## 2017-07-07 NOTE — Patient Instructions (Addendum)
Romiplostim injection (NPLATE) What is this medicine? ROMIPLOSTIM (roe mi PLOE stim) helps your body make more platelets. This medicine is used to treat low platelets caused by chronic idiopathic thrombocytopenic purpura (ITP). This medicine may be used for other purposes; ask your health care provider or pharmacist if you have questions. COMMON BRAND NAME(S): Nplate What should I tell my health care provider before I take this medicine? They need to know if you have any of these conditions: -cancer or myelodysplastic syndrome -low blood counts, like low white cell, platelet, or red cell counts -take medicines that treat or prevent blood clots -an unusual or allergic reaction to romiplostim, mannitol, other medicines, foods, dyes, or preservatives -pregnant or trying to get pregnant -breast-feeding How should I use this medicine? This medicine is for injection under the skin. It is given by a health care professional in a hospital or clinic setting. A special MedGuide will be given to you before your injection. Read this information carefully each time. Talk to your pediatrician regarding the use of this medicine in children. Special care may be needed. Overdosage: If you think you have taken too much of this medicine contact a poison control center or emergency room at once. NOTE: This medicine is only for you. Do not share this medicine with others. What if I miss a dose? It is important not to miss your dose. Call your doctor or health care professional if you are unable to keep an appointment. What may interact with this medicine? Interactions are not expected. This list may not describe all possible interactions. Give your health care provider a list of all the medicines, herbs, non-prescription drugs, or dietary supplements you use. Also tell them if you smoke, drink alcohol, or use illegal drugs. Some items may interact with your medicine. What should I watch for while using this  medicine? Your condition will be monitored carefully while you are receiving this medicine. Visit your prescriber or health care professional for regular checks on your progress and for the needed blood tests. It is important to keep all appointments. What side effects may I notice from receiving this medicine? Side effects that you should report to your doctor or health care professional as soon as possible: -allergic reactions like skin rash, itching or hives, swelling of the face, lips, or tongue -shortness of breath, chest pain, swelling in a leg -unusual bleeding or bruising Side effects that usually do not require medical attention (report to your doctor or health care professional if they continue or are bothersome): -dizziness -headache -muscle aches -pain in arms and legs -stomach pain -trouble sleeping This list may not describe all possible side effects. Call your doctor for medical advice about side effects. You may report side effects to FDA at 1-800-FDA-1088. Where should I keep my medicine? This drug is given in a hospital or clinic and will not be stored at home. NOTE: This sheet is a summary. It may not cover all possible information. If you have questions about this medicine, talk to your doctor, pharmacist, or health care provider.  2018 Elsevier/Gold Standard (2008-04-23 15:13:04)   Ferumoxytol injection (Feraheme) What is this medicine? FERUMOXYTOL is an iron complex. Iron is used to make healthy red blood cells, which carry oxygen and nutrients throughout the body. This medicine is used to treat iron deficiency anemia in people with chronic kidney disease. This medicine may be used for other purposes; ask your health care provider or pharmacist if you have questions. COMMON BRAND NAME(S): Feraheme  What should I tell my health care provider before I take this medicine? They need to know if you have any of these conditions: -anemia not caused by low iron  levels -high levels of iron in the blood -magnetic resonance imaging (MRI) test scheduled -an unusual or allergic reaction to iron, other medicines, foods, dyes, or preservatives -pregnant or trying to get pregnant -breast-feeding How should I use this medicine? This medicine is for injection into a vein. It is given by a health care professional in a hospital or clinic setting. Talk to your pediatrician regarding the use of this medicine in children. Special care may be needed. Overdosage: If you think you have taken too much of this medicine contact a poison control center or emergency room at once. NOTE: This medicine is only for you. Do not share this medicine with others. What if I miss a dose? It is important not to miss your dose. Call your doctor or health care professional if you are unable to keep an appointment. What may interact with this medicine? This medicine may interact with the following medications: -other iron products This list may not describe all possible interactions. Give your health care provider a list of all the medicines, herbs, non-prescription drugs, or dietary supplements you use. Also tell them if you smoke, drink alcohol, or use illegal drugs. Some items may interact with your medicine. What should I watch for while using this medicine? Visit your doctor or healthcare professional regularly. Tell your doctor or healthcare professional if your symptoms do not start to get better or if they get worse. You may need blood work done while you are taking this medicine. You may need to follow a special diet. Talk to your doctor. Foods that contain iron include: whole grains/cereals, dried fruits, beans, or peas, leafy green vegetables, and organ meats (liver, kidney). What side effects may I notice from receiving this medicine? Side effects that you should report to your doctor or health care professional as soon as possible: -allergic reactions like skin rash,  itching or hives, swelling of the face, lips, or tongue -breathing problems -changes in blood pressure -feeling faint or lightheaded, falls -fever or chills -flushing, sweating, or hot feelings -swelling of the ankles or feet Side effects that usually do not require medical attention (report to your doctor or health care professional if they continue or are bothersome): -diarrhea -headache -nausea, vomiting -stomach pain This list may not describe all possible side effects. Call your doctor for medical advice about side effects. You may report side effects to FDA at 1-800-FDA-1088. Where should I keep my medicine? This drug is given in a hospital or clinic and will not be stored at home. NOTE: This sheet is a summary. It may not cover all possible information. If you have questions about this medicine, talk to your doctor, pharmacist, or health care provider.  2018 Elsevier/Gold Standard (2015-09-26 12:41:49)

## 2017-07-12 ENCOUNTER — Encounter: Payer: Self-pay | Admitting: Hematology & Oncology

## 2017-07-13 ENCOUNTER — Encounter: Payer: Self-pay | Admitting: Hematology & Oncology

## 2017-07-13 ENCOUNTER — Other Ambulatory Visit: Payer: Self-pay | Admitting: *Deleted

## 2017-07-13 DIAGNOSIS — D696 Thrombocytopenia, unspecified: Secondary | ICD-10-CM

## 2017-07-14 ENCOUNTER — Other Ambulatory Visit: Payer: Self-pay

## 2017-07-14 ENCOUNTER — Ambulatory Visit (HOSPITAL_BASED_OUTPATIENT_CLINIC_OR_DEPARTMENT_OTHER): Payer: Medicare Other

## 2017-07-14 ENCOUNTER — Other Ambulatory Visit (HOSPITAL_BASED_OUTPATIENT_CLINIC_OR_DEPARTMENT_OTHER): Payer: Medicare Other

## 2017-07-14 VITALS — BP 160/49 | HR 61 | Temp 97.9°F | Resp 18

## 2017-07-14 DIAGNOSIS — D696 Thrombocytopenia, unspecified: Secondary | ICD-10-CM

## 2017-07-14 DIAGNOSIS — N184 Chronic kidney disease, stage 4 (severe): Secondary | ICD-10-CM

## 2017-07-14 LAB — CBC WITH DIFFERENTIAL (CANCER CENTER ONLY)
BASO#: 0 10*3/uL (ref 0.0–0.2)
BASO%: 0.4 % (ref 0.0–2.0)
EOS%: 3.9 % (ref 0.0–7.0)
Eosinophils Absolute: 0.2 10*3/uL (ref 0.0–0.5)
HCT: 32.4 % — ABNORMAL LOW (ref 34.8–46.6)
HGB: 10.8 g/dL — ABNORMAL LOW (ref 11.6–15.9)
LYMPH#: 1.1 10*3/uL (ref 0.9–3.3)
LYMPH%: 22.2 % (ref 14.0–48.0)
MCH: 32.8 pg (ref 26.0–34.0)
MCHC: 33.3 g/dL (ref 32.0–36.0)
MCV: 99 fL (ref 81–101)
MONO#: 0.4 10*3/uL (ref 0.1–0.9)
MONO%: 7.6 % (ref 0.0–13.0)
NEUT#: 3.4 10*3/uL (ref 1.5–6.5)
NEUT%: 65.9 % (ref 39.6–80.0)
Platelets: 96 10*3/uL — ABNORMAL LOW (ref 145–400)
RBC: 3.29 10*6/uL — ABNORMAL LOW (ref 3.70–5.32)
RDW: 18.2 % — ABNORMAL HIGH (ref 11.1–15.7)
WBC: 5.1 10*3/uL (ref 3.9–10.0)

## 2017-07-14 MED ORDER — ROMIPLOSTIM INJECTION 500 MCG
335.0000 ug | Freq: Once | SUBCUTANEOUS | Status: AC
  Administered 2017-07-14: 335 ug via SUBCUTANEOUS
  Filled 2017-07-14: qty 0.67

## 2017-07-14 NOTE — Patient Instructions (Signed)
Romiplostim injection (NPLATE) What is this medicine? ROMIPLOSTIM (roe mi PLOE stim) helps your body make more platelets. This medicine is used to treat low platelets caused by chronic idiopathic thrombocytopenic purpura (ITP). This medicine may be used for other purposes; ask your health care provider or pharmacist if you have questions. COMMON BRAND NAME(S): Nplate What should I tell my health care provider before I take this medicine? They need to know if you have any of these conditions: -cancer or myelodysplastic syndrome -low blood counts, like low white cell, platelet, or red cell counts -take medicines that treat or prevent blood clots -an unusual or allergic reaction to romiplostim, mannitol, other medicines, foods, dyes, or preservatives -pregnant or trying to get pregnant -breast-feeding How should I use this medicine? This medicine is for injection under the skin. It is given by a health care professional in a hospital or clinic setting. A special MedGuide will be given to you before your injection. Read this information carefully each time. Talk to your pediatrician regarding the use of this medicine in children. Special care may be needed. Overdosage: If you think you have taken too much of this medicine contact a poison control center or emergency room at once. NOTE: This medicine is only for you. Do not share this medicine with others. What if I miss a dose? It is important not to miss your dose. Call your doctor or health care professional if you are unable to keep an appointment. What may interact with this medicine? Interactions are not expected. This list may not describe all possible interactions. Give your health care provider a list of all the medicines, herbs, non-prescription drugs, or dietary supplements you use. Also tell them if you smoke, drink alcohol, or use illegal drugs. Some items may interact with your medicine. What should I watch for while using this  medicine? Your condition will be monitored carefully while you are receiving this medicine. Visit your prescriber or health care professional for regular checks on your progress and for the needed blood tests. It is important to keep all appointments. What side effects may I notice from receiving this medicine? Side effects that you should report to your doctor or health care professional as soon as possible: -allergic reactions like skin rash, itching or hives, swelling of the face, lips, or tongue -shortness of breath, chest pain, swelling in a leg -unusual bleeding or bruising Side effects that usually do not require medical attention (report to your doctor or health care professional if they continue or are bothersome): -dizziness -headache -muscle aches -pain in arms and legs -stomach pain -trouble sleeping This list may not describe all possible side effects. Call your doctor for medical advice about side effects. You may report side effects to FDA at 1-800-FDA-1088. Where should I keep my medicine? This drug is given in a hospital or clinic and will not be stored at home. NOTE: This sheet is a summary. It may not cover all possible information. If you have questions about this medicine, talk to your doctor, pharmacist, or health care provider.  2018 Elsevier/Gold Standard (2008-04-23 15:13:04)  

## 2017-07-20 ENCOUNTER — Encounter (HOSPITAL_COMMUNITY): Payer: Self-pay

## 2017-07-20 ENCOUNTER — Other Ambulatory Visit: Payer: Self-pay | Admitting: *Deleted

## 2017-07-20 DIAGNOSIS — D696 Thrombocytopenia, unspecified: Secondary | ICD-10-CM

## 2017-07-21 ENCOUNTER — Other Ambulatory Visit (HOSPITAL_BASED_OUTPATIENT_CLINIC_OR_DEPARTMENT_OTHER): Payer: Medicare Other

## 2017-07-21 ENCOUNTER — Ambulatory Visit (HOSPITAL_BASED_OUTPATIENT_CLINIC_OR_DEPARTMENT_OTHER): Payer: Medicare Other

## 2017-07-21 VITALS — BP 172/52 | HR 67 | Temp 97.6°F | Resp 20

## 2017-07-21 DIAGNOSIS — D696 Thrombocytopenia, unspecified: Secondary | ICD-10-CM

## 2017-07-21 DIAGNOSIS — N184 Chronic kidney disease, stage 4 (severe): Secondary | ICD-10-CM | POA: Diagnosis not present

## 2017-07-21 LAB — CBC WITH DIFFERENTIAL (CANCER CENTER ONLY)
BASO#: 0 10*3/uL (ref 0.0–0.2)
BASO%: 0.2 % (ref 0.0–2.0)
EOS%: 3.7 % (ref 0.0–7.0)
Eosinophils Absolute: 0.2 10*3/uL (ref 0.0–0.5)
HCT: 31.5 % — ABNORMAL LOW (ref 34.8–46.6)
HGB: 10.6 g/dL — ABNORMAL LOW (ref 11.6–15.9)
LYMPH#: 1.2 10*3/uL (ref 0.9–3.3)
LYMPH%: 24 % (ref 14.0–48.0)
MCH: 33.3 pg (ref 26.0–34.0)
MCHC: 33.7 g/dL (ref 32.0–36.0)
MCV: 99 fL (ref 81–101)
MONO#: 0.4 10*3/uL (ref 0.1–0.9)
MONO%: 7.6 % (ref 0.0–13.0)
NEUT#: 3.2 10*3/uL (ref 1.5–6.5)
NEUT%: 64.5 % (ref 39.6–80.0)
Platelets: 122 10*3/uL — ABNORMAL LOW (ref 145–400)
RBC: 3.18 10*6/uL — ABNORMAL LOW (ref 3.70–5.32)
RDW: 18.2 % — ABNORMAL HIGH (ref 11.1–15.7)
WBC: 4.9 10*3/uL (ref 3.9–10.0)

## 2017-07-21 LAB — CHROMOSOME ANALYSIS, BONE MARROW

## 2017-07-21 LAB — TISSUE HYBRIDIZATION (BONE MARROW)-NCBH

## 2017-07-21 MED ORDER — ROMIPLOSTIM INJECTION 500 MCG
335.0000 ug | Freq: Once | SUBCUTANEOUS | Status: AC
  Administered 2017-07-21: 335 ug via SUBCUTANEOUS
  Filled 2017-07-21: qty 0.67

## 2017-07-21 NOTE — Patient Instructions (Signed)
Romiplostim injection What is this medicine? ROMIPLOSTIM (roe mi PLOE stim) helps your body make more platelets. This medicine is used to treat low platelets caused by chronic idiopathic thrombocytopenic purpura (ITP). This medicine may be used for other purposes; ask your health care provider or pharmacist if you have questions. COMMON BRAND NAME(S): Nplate What should I tell my health care provider before I take this medicine? They need to know if you have any of these conditions: -cancer or myelodysplastic syndrome -low blood counts, like low white cell, platelet, or red cell counts -take medicines that treat or prevent blood clots -an unusual or allergic reaction to romiplostim, mannitol, other medicines, foods, dyes, or preservatives -pregnant or trying to get pregnant -breast-feeding How should I use this medicine? This medicine is for injection under the skin. It is given by a health care professional in a hospital or clinic setting. A special MedGuide will be given to you before your injection. Read this information carefully each time. Talk to your pediatrician regarding the use of this medicine in children. Special care may be needed. Overdosage: If you think you have taken too much of this medicine contact a poison control center or emergency room at once. NOTE: This medicine is only for you. Do not share this medicine with others. What if I miss a dose? It is important not to miss your dose. Call your doctor or health care professional if you are unable to keep an appointment. What may interact with this medicine? Interactions are not expected. This list may not describe all possible interactions. Give your health care provider a list of all the medicines, herbs, non-prescription drugs, or dietary supplements you use. Also tell them if you smoke, drink alcohol, or use illegal drugs. Some items may interact with your medicine. What should I watch for while using this  medicine? Your condition will be monitored carefully while you are receiving this medicine. Visit your prescriber or health care professional for regular checks on your progress and for the needed blood tests. It is important to keep all appointments. What side effects may I notice from receiving this medicine? Side effects that you should report to your doctor or health care professional as soon as possible: -allergic reactions like skin rash, itching or hives, swelling of the face, lips, or tongue -shortness of breath, chest pain, swelling in a leg -unusual bleeding or bruising Side effects that usually do not require medical attention (report to your doctor or health care professional if they continue or are bothersome): -dizziness -headache -muscle aches -pain in arms and legs -stomach pain -trouble sleeping This list may not describe all possible side effects. Call your doctor for medical advice about side effects. You may report side effects to FDA at 1-800-FDA-1088. Where should I keep my medicine? This drug is given in a hospital or clinic and will not be stored at home. NOTE: This sheet is a summary. It may not cover all possible information. If you have questions about this medicine, talk to your doctor, pharmacist, or health care provider.  2018 Elsevier/Gold Standard (2008-04-23 15:13:04)  

## 2017-07-23 ENCOUNTER — Telehealth: Payer: Self-pay | Admitting: Adult Health

## 2017-07-23 DIAGNOSIS — Z760 Encounter for issue of repeat prescription: Secondary | ICD-10-CM

## 2017-07-23 NOTE — Telephone Encounter (Signed)
Copied from [place] [id number]. Topic: Inquiry >> Jul 23, 2017 11:42 AM Oliver Pila B wrote: Reason for CRM: PT called to get a Rx for her sertraline refilled b/c the pharmacy denied reflling it when she was there, contact pt if needed

## 2017-07-23 NOTE — Telephone Encounter (Signed)
Left voicemail for pt to call office back to confirm medication refill would be sent to the correct pharmacy.

## 2017-07-26 DIAGNOSIS — M47814 Spondylosis without myelopathy or radiculopathy, thoracic region: Secondary | ICD-10-CM | POA: Diagnosis not present

## 2017-07-26 DIAGNOSIS — M546 Pain in thoracic spine: Secondary | ICD-10-CM | POA: Diagnosis not present

## 2017-07-26 DIAGNOSIS — M9902 Segmental and somatic dysfunction of thoracic region: Secondary | ICD-10-CM | POA: Diagnosis not present

## 2017-07-26 DIAGNOSIS — M5134 Other intervertebral disc degeneration, thoracic region: Secondary | ICD-10-CM | POA: Diagnosis not present

## 2017-07-26 MED ORDER — SERTRALINE HCL 50 MG PO TABS: 50.0000 mg | ORAL_TABLET | Freq: Every day | ORAL | 1 refills | Status: DC

## 2017-07-26 NOTE — Telephone Encounter (Signed)
Pt pharm [facility] and pt is out of sertraline

## 2017-07-26 NOTE — Telephone Encounter (Signed)
Pt called and made aware medication as refilled.

## 2017-07-26 NOTE — Addendum Note (Signed)
Addended by: Dimple Nanas on: 07/26/2017 11:29 AM   Modules accepted: Orders

## 2017-07-27 DIAGNOSIS — M9902 Segmental and somatic dysfunction of thoracic region: Secondary | ICD-10-CM | POA: Diagnosis not present

## 2017-07-27 DIAGNOSIS — M5134 Other intervertebral disc degeneration, thoracic region: Secondary | ICD-10-CM | POA: Diagnosis not present

## 2017-07-27 DIAGNOSIS — M47814 Spondylosis without myelopathy or radiculopathy, thoracic region: Secondary | ICD-10-CM | POA: Diagnosis not present

## 2017-07-27 DIAGNOSIS — M546 Pain in thoracic spine: Secondary | ICD-10-CM | POA: Diagnosis not present

## 2017-07-28 ENCOUNTER — Other Ambulatory Visit (HOSPITAL_BASED_OUTPATIENT_CLINIC_OR_DEPARTMENT_OTHER): Payer: Medicare Other

## 2017-07-28 ENCOUNTER — Ambulatory Visit (HOSPITAL_BASED_OUTPATIENT_CLINIC_OR_DEPARTMENT_OTHER): Payer: Medicare Other | Admitting: Hematology & Oncology

## 2017-07-28 ENCOUNTER — Ambulatory Visit (HOSPITAL_BASED_OUTPATIENT_CLINIC_OR_DEPARTMENT_OTHER): Payer: Medicare Other

## 2017-07-28 ENCOUNTER — Other Ambulatory Visit: Payer: Self-pay

## 2017-07-28 VITALS — BP 185/56 | HR 68 | Temp 97.9°F | Resp 20 | Wt 247.5 lb

## 2017-07-28 VITALS — BP 150/52 | HR 75

## 2017-07-28 DIAGNOSIS — D696 Thrombocytopenia, unspecified: Secondary | ICD-10-CM

## 2017-07-28 DIAGNOSIS — Z86711 Personal history of pulmonary embolism: Secondary | ICD-10-CM

## 2017-07-28 DIAGNOSIS — D500 Iron deficiency anemia secondary to blood loss (chronic): Secondary | ICD-10-CM

## 2017-07-28 DIAGNOSIS — N184 Chronic kidney disease, stage 4 (severe): Secondary | ICD-10-CM | POA: Diagnosis not present

## 2017-07-28 DIAGNOSIS — E119 Type 2 diabetes mellitus without complications: Secondary | ICD-10-CM | POA: Diagnosis not present

## 2017-07-28 DIAGNOSIS — E1122 Type 2 diabetes mellitus with diabetic chronic kidney disease: Secondary | ICD-10-CM

## 2017-07-28 DIAGNOSIS — Z794 Long term (current) use of insulin: Secondary | ICD-10-CM | POA: Diagnosis not present

## 2017-07-28 DIAGNOSIS — D509 Iron deficiency anemia, unspecified: Secondary | ICD-10-CM | POA: Diagnosis not present

## 2017-07-28 LAB — CBC WITH DIFFERENTIAL (CANCER CENTER ONLY)
BASO#: 0 10*3/uL (ref 0.0–0.2)
BASO%: 0.4 % (ref 0.0–2.0)
EOS%: 4.3 % (ref 0.0–7.0)
Eosinophils Absolute: 0.2 10*3/uL (ref 0.0–0.5)
HCT: 34 % — ABNORMAL LOW (ref 34.8–46.6)
HGB: 11.4 g/dL — ABNORMAL LOW (ref 11.6–15.9)
LYMPH#: 1.3 10*3/uL (ref 0.9–3.3)
LYMPH%: 23.5 % (ref 14.0–48.0)
MCH: 32.9 pg (ref 26.0–34.0)
MCHC: 33.5 g/dL (ref 32.0–36.0)
MCV: 98 fL (ref 81–101)
MONO#: 0.4 10*3/uL (ref 0.1–0.9)
MONO%: 6.5 % (ref 0.0–13.0)
NEUT#: 3.5 10*3/uL (ref 1.5–6.5)
NEUT%: 65.3 % (ref 39.6–80.0)
Platelets: 82 10*3/uL — ABNORMAL LOW (ref 145–400)
RBC: 3.47 10*6/uL — ABNORMAL LOW (ref 3.70–5.32)
RDW: 17.7 % — ABNORMAL HIGH (ref 11.1–15.7)
WBC: 5.4 10*3/uL (ref 3.9–10.0)

## 2017-07-28 LAB — CMP (CANCER CENTER ONLY)
ALT(SGPT): 32 U/L (ref 10–47)
AST: 28 U/L (ref 11–38)
Albumin: 4.1 g/dL (ref 3.3–5.5)
Alkaline Phosphatase: 41 U/L (ref 26–84)
BUN, Bld: 32 mg/dL — ABNORMAL HIGH (ref 7–22)
CO2: 31 mEq/L (ref 18–33)
Calcium: 9.7 mg/dL (ref 8.0–10.3)
Chloride: 98 mEq/L (ref 98–108)
Creat: 2.3 mg/dl — ABNORMAL HIGH (ref 0.6–1.2)
Glucose, Bld: 250 mg/dL — ABNORMAL HIGH (ref 73–118)
Potassium: 4.4 mEq/L (ref 3.3–4.7)
Sodium: 147 mEq/L — ABNORMAL HIGH (ref 128–145)
Total Bilirubin: 1 mg/dl (ref 0.20–1.60)
Total Protein: 7.8 g/dL (ref 6.4–8.1)

## 2017-07-28 MED ORDER — ROMIPLOSTIM INJECTION 500 MCG
3.0000 ug/kg | Freq: Once | SUBCUTANEOUS | Status: AC
Start: 2017-07-28 — End: 2017-07-28
  Administered 2017-07-28: 335 ug via SUBCUTANEOUS
  Filled 2017-07-28: qty 0.67

## 2017-07-28 NOTE — Patient Instructions (Signed)
Romiplostim injection What is this medicine? ROMIPLOSTIM (roe mi PLOE stim) helps your body make more platelets. This medicine is used to treat low platelets caused by chronic idiopathic thrombocytopenic purpura (ITP). This medicine may be used for other purposes; ask your health care provider or pharmacist if you have questions. COMMON BRAND NAME(S): Nplate What should I tell my health care provider before I take this medicine? They need to know if you have any of these conditions: -cancer or myelodysplastic syndrome -low blood counts, like low white cell, platelet, or red cell counts -take medicines that treat or prevent blood clots -an unusual or allergic reaction to romiplostim, mannitol, other medicines, foods, dyes, or preservatives -pregnant or trying to get pregnant -breast-feeding How should I use this medicine? This medicine is for injection under the skin. It is given by a health care professional in a hospital or clinic setting. A special MedGuide will be given to you before your injection. Read this information carefully each time. Talk to your pediatrician regarding the use of this medicine in children. Special care may be needed. Overdosage: If you think you have taken too much of this medicine contact a poison control center or emergency room at once. NOTE: This medicine is only for you. Do not share this medicine with others. What if I miss a dose? It is important not to miss your dose. Call your doctor or health care professional if you are unable to keep an appointment. What may interact with this medicine? Interactions are not expected. This list may not describe all possible interactions. Give your health care provider a list of all the medicines, herbs, non-prescription drugs, or dietary supplements you use. Also tell them if you smoke, drink alcohol, or use illegal drugs. Some items may interact with your medicine. What should I watch for while using this  medicine? Your condition will be monitored carefully while you are receiving this medicine. Visit your prescriber or health care professional for regular checks on your progress and for the needed blood tests. It is important to keep all appointments. What side effects may I notice from receiving this medicine? Side effects that you should report to your doctor or health care professional as soon as possible: -allergic reactions like skin rash, itching or hives, swelling of the face, lips, or tongue -shortness of breath, chest pain, swelling in a leg -unusual bleeding or bruising Side effects that usually do not require medical attention (report to your doctor or health care professional if they continue or are bothersome): -dizziness -headache -muscle aches -pain in arms and legs -stomach pain -trouble sleeping This list may not describe all possible side effects. Call your doctor for medical advice about side effects. You may report side effects to FDA at 1-800-FDA-1088. Where should I keep my medicine? This drug is given in a hospital or clinic and will not be stored at home. NOTE: This sheet is a summary. It may not cover all possible information. If you have questions about this medicine, talk to your doctor, pharmacist, or health care provider.  2018 Elsevier/Gold Standard (2008-04-23 15:13:04)  

## 2017-07-28 NOTE — Progress Notes (Signed)
Hematology and Oncology Follow Up Visit  HOLIDAY MCMENAMIN [id number] 12-14-1934 82 y.o. 07/28/2017   Principle Diagnosis:  Thrombocytopenia History of PE Iron deficiency anemia  Current Therapy:   Nplate as indicated  IV Iron as indicated - dose given on 07/07/2017   Interim History:  Ms. Birge is here today with her daughter for follow-up.   We did get a bone marrow biopsy on her.  This was done on October 22.  The bone marrow report ([id number]) showed a hypercellular marrow with erythroid and megakaryocytic hyperplasia.  The cytogenetics analysis and FISH studies were normal.  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  She has been getting Nplate.  This is helped.  This goes along with her having an immune-based thrombocytopenia.  She also has had iron deficiency.  I suspect that she probably has some bleeding from the GI tract.  We have given her IV iron.  She got IV iron a couple weeks ago.  This made her feel better.  She has had marked problems with diabetes.  Her blood sugars, whenever we see her, are typically in the 200-300 range.  I told her that this is going to determine her prognosis for more than her abnormal blood counts would.  She tries to watch what she eats.  She said that she is doing all that she can think of to try to help with her diabetes.  She currently is on a vegetarian type diet.  Overall, I will see her performance status is ECOG 1-2   Medications:  Allergies as of 07/28/2017      Reactions   Metformin Diarrhea   Pineapple Swelling   Throat swells and blisters on tongue and roof of mouth per patient   Tetracycline Hives   Eliquis [apixaban] Other (See Comments)   Internal Bleeding   Aspirin Itching, Rash   Contrast Media [iodinated Diagnostic Agents] Itching      Flagyl [metronidazole] Other (See Comments)   Unknown   Fluticasone-salmeterol Itching, Rash   Lactose Intolerance (gi) Other (See  Comments)   gas   Latex Itching, Rash   Lisinopril Cough   Sulfonamide Derivatives Itching, Rash      Medication List        Accurate as of 07/28/17  2:39 PM. Always use your most recent med list.          atorvastatin 40 MG tablet Commonly known as:  LIPITOR Take 40 mg by mouth daily.   BIOTIN PO Take 1 tablet by mouth daily.   CALCIUM 1200 PO Take 1 tablet by mouth daily.   Coenzyme Q10 200 MG capsule Take 200 mg by mouth daily.   docusate sodium 100 MG capsule Commonly known as:  COLACE Take 1 capsule by mouth daily.   fenofibrate 54 MG tablet TAKE 1 TABLET(54 MG) BY MOUTH DAILY   fexofenadine 180 MG tablet Commonly known as:  ALLEGRA Take 180 mg by mouth daily.   furosemide 20 MG tablet Commonly known as:  LASIX Take 1 tablet (20 mg total) by mouth daily as needed.   glipiZIDE 10 MG tablet Commonly known as:  GLUCOTROL Take 10 mg by mouth 2 (two) times daily before a meal.   HUMULIN 70/30 (70-30) 100 UNIT/ML injection Generic drug:  insulin NPH-regular Human Inject 70 units under the skin in the morning, 15 units at noon and 65 units every evening.   Insulin Pen Needle 31G X 5 MM Misc Commonly known as:  B-D UF III MINI PEN NEEDLES Use daily for insulin injection   INSULIN SYRINGE 1CC/30GX5/16" 30G X 5/16" 1 ML Misc USE BID UTD   levothyroxine 112 MCG tablet Commonly known as:  SYNTHROID, LEVOTHROID Take 1 tablet (112 mcg total) by mouth daily.   multivitamin tablet Take 1 tablet by mouth daily.   potassium chloride SA 20 MEQ tablet Commonly known as:  K-DUR,KLOR-CON Take 20 mEq by mouth 2 (two) times daily.   PROBIOTIC PO Take 1 capsule by mouth daily.   ranitidine 150 MG tablet Commonly known as:  ZANTAC Take 150 mg by mouth daily as needed for heartburn.   sertraline 50 MG tablet Commonly known as:  ZOLOFT Take 1 tablet (50 mg total) daily by mouth.   vitamin C 250 MG tablet Commonly known as:  ASCORBIC ACID Take 250 mg by mouth  daily.   Vitamin D3 2000 units Tabs Take 2,000 Units by mouth daily.       Allergies:  Allergies  Allergen Reactions  . Metformin Diarrhea  . Pineapple Swelling    Throat swells and blisters on tongue and roof of mouth per patient  . Tetracycline Hives  . Eliquis [Apixaban] Other (See Comments)    Internal Bleeding   . Aspirin Itching and Rash  . Contrast Media [Iodinated Diagnostic Agents] Itching       . Flagyl [Metronidazole] Other (See Comments)    Unknown   . Fluticasone-Salmeterol Itching and Rash  . Lactose Intolerance (Gi) Other (See Comments)    gas  . Latex Itching and Rash  . Lisinopril Cough  . Sulfonamide Derivatives Itching and Rash    Past Medical History, Surgical history, Social history, and Family History were reviewed and updated.  Review of Systems: As stated in the interim history Physical Exam:  weight is 247 lb 8 oz (112.3 kg). Her oral temperature is 97.9 F (36.6 C). Her blood pressure is 185/56 (abnormal) and her pulse is 68. Her respiration is 20 and oxygen saturation is 94%.   Wt Readings from Last 3 Encounters:  07/28/17 247 lb 8 oz (112.3 kg)  06/30/17 246 lb 6.4 oz (111.8 kg)  06/28/17 245 lb (111.1 kg)    Obese white female in no obvious distress.  Head neck exam shows no ocular or oral lesions.  There are no palpable cervical or supraclavicular lymph nodes.  Lungs are clear bilaterally.  Cardiac exam regular rate and rhythm with no murmurs, rubs or bruits.  Abdomen is soft.  She is obese.  She has good bowel sounds.  There is no fluid wave.  There is no palpable liver or spleen tip.  Back exam shows no tenderness over the spine, ribs or hips.  Extremities shows stasis dermatitis in her lower legs.  She has 2+ edema in her lower legs.  Neurological exam is nonfocal.  Lab Results  Component Value Date   WBC 5.4 07/28/2017   HGB 11.4 (L) 07/28/2017   HCT 34.0 (L) 07/28/2017   MCV 98 07/28/2017   PLT 82 Platelet count consistent in  citrate (L) 07/28/2017   Lab Results  Component Value Date   FERRITIN 122 06/30/2017   IRON 78 06/30/2017   TIBC 394 06/30/2017   UIBC 316 06/30/2017   IRONPCTSAT 20 (L) 06/30/2017   Lab Results  Component Value Date   RETICCTPCT 1.9 01/28/2011   RBC 3.47 (L) 07/28/2017   RETICCTABS 73.3 01/28/2011   No results found for: KPAFRELGTCHN, LAMBDASER, KAPLAMBRATIO No results found for:  IGGSERUM, IGA, IGMSERUM No results found for: Kathrynn Ducking, MSPIKE, SPEI   Chemistry      Component Value Date/Time   NA 147 (H) 07/28/2017 1347   NA 142 02/25/2017 1013   K 4.4 07/28/2017 1347   K 3.8 02/25/2017 1013   CL 98 07/28/2017 1347   CO2 31 07/28/2017 1347   CO2 26 02/25/2017 1013   BUN 32 (H) 07/28/2017 1347   BUN 20.1 02/25/2017 1013   CREATININE 2.3 (H) 07/28/2017 1347   CREATININE 1.8 (H) 02/25/2017 1013      Component Value Date/Time   CALCIUM 9.7 07/28/2017 1347   CALCIUM 8.7 02/25/2017 1013   ALKPHOS 41 07/28/2017 1347   ALKPHOS 65 02/25/2017 1013   AST 28 07/28/2017 1347   AST 16 02/25/2017 1013   ALT 32 07/28/2017 1347   ALT 13 02/25/2017 1013   BILITOT 1.00 07/28/2017 1347   BILITOT 0.76 02/25/2017 1013      Impression and Plan: Ms. Duling is a very pleasant 82 year old white female.  She has multiple medical problems.  She has multiple hematologic issues.  The bone marrow biopsy was done and I am glad that it was done as I think it showed that there was no obvious hematologic malignancy (i.e. myelodysplasia) causing the thrombocytopenia.  I think this is immune based.  We will continue her on the endplate.  We could certainly try her on IVIG or Rituxan.  I would like to avoid steroids because of her diabetes.  I would be fearful that with steroids, her diabetes will get out of control she will end up in the hospital.  We will have to watch her blood counts closely.  We will have to monitor her iron levels.  I spent a  good 35 minutes with she and her daughter.  I must say it is always a nice time talking with them.    Volanda Napoleon, MD 11/21/20182:39 PM

## 2017-07-29 LAB — RETICULOCYTES: Reticulocyte Count: 3.6 % — ABNORMAL HIGH (ref 0.6–2.6)

## 2017-07-30 LAB — IRON AND TIBC
%SAT: 26 % (ref 21–57)
Iron: 101 ug/dL (ref 41–142)
TIBC: 393 ug/dL (ref 236–444)
UIBC: 291 ug/dL (ref 120–384)

## 2017-07-30 LAB — FERRITIN: Ferritin: 222 ng/ml (ref 9–269)

## 2017-08-03 ENCOUNTER — Other Ambulatory Visit: Payer: Self-pay | Admitting: Adult Health

## 2017-08-03 DIAGNOSIS — M9902 Segmental and somatic dysfunction of thoracic region: Secondary | ICD-10-CM | POA: Diagnosis not present

## 2017-08-03 DIAGNOSIS — M47814 Spondylosis without myelopathy or radiculopathy, thoracic region: Secondary | ICD-10-CM | POA: Diagnosis not present

## 2017-08-03 DIAGNOSIS — M5134 Other intervertebral disc degeneration, thoracic region: Secondary | ICD-10-CM | POA: Diagnosis not present

## 2017-08-03 DIAGNOSIS — M546 Pain in thoracic spine: Secondary | ICD-10-CM | POA: Diagnosis not present

## 2017-08-04 ENCOUNTER — Ambulatory Visit (HOSPITAL_BASED_OUTPATIENT_CLINIC_OR_DEPARTMENT_OTHER): Payer: Medicare Other

## 2017-08-04 ENCOUNTER — Other Ambulatory Visit (HOSPITAL_BASED_OUTPATIENT_CLINIC_OR_DEPARTMENT_OTHER): Payer: Medicare Other

## 2017-08-04 VITALS — BP 177/55 | HR 81 | Temp 97.4°F | Resp 16

## 2017-08-04 DIAGNOSIS — N184 Chronic kidney disease, stage 4 (severe): Secondary | ICD-10-CM

## 2017-08-04 DIAGNOSIS — D696 Thrombocytopenia, unspecified: Secondary | ICD-10-CM | POA: Diagnosis present

## 2017-08-04 LAB — CBC WITH DIFFERENTIAL (CANCER CENTER ONLY)
BASO#: 0 10*3/uL (ref 0.0–0.2)
BASO%: 0.4 % (ref 0.0–2.0)
EOS%: 3.9 % (ref 0.0–7.0)
Eosinophils Absolute: 0.2 10*3/uL (ref 0.0–0.5)
HCT: 33.1 % — ABNORMAL LOW (ref 34.8–46.6)
HGB: 11.2 g/dL — ABNORMAL LOW (ref 11.6–15.9)
LYMPH#: 1.4 10*3/uL (ref 0.9–3.3)
LYMPH%: 24.7 % (ref 14.0–48.0)
MCH: 33.2 pg (ref 26.0–34.0)
MCHC: 33.8 g/dL (ref 32.0–36.0)
MCV: 98 fL (ref 81–101)
MONO#: 0.4 10*3/uL (ref 0.1–0.9)
MONO%: 7.7 % (ref 0.0–13.0)
NEUT#: 3.6 10*3/uL (ref 1.5–6.5)
NEUT%: 63.3 % (ref 39.6–80.0)
Platelets: 42 10*3/uL — ABNORMAL LOW (ref 145–400)
RBC: 3.37 10*6/uL — ABNORMAL LOW (ref 3.70–5.32)
RDW: 17.6 % — ABNORMAL HIGH (ref 11.1–15.7)
WBC: 5.7 10*3/uL (ref 3.9–10.0)

## 2017-08-04 MED ORDER — ROMIPLOSTIM INJECTION 500 MCG
4.0000 ug/kg | Freq: Once | SUBCUTANEOUS | Status: AC
  Administered 2017-08-04: 450 ug via SUBCUTANEOUS
  Filled 2017-08-04: qty 0.9

## 2017-08-04 NOTE — Patient Instructions (Signed)
Romiplostim injection What is this medicine? ROMIPLOSTIM (roe mi PLOE stim) helps your body make more platelets. This medicine is used to treat low platelets caused by chronic idiopathic thrombocytopenic purpura (ITP). This medicine may be used for other purposes; ask your health care provider or pharmacist if you have questions. COMMON BRAND NAME(S): Nplate What should I tell my health care provider before I take this medicine? They need to know if you have any of these conditions: -cancer or myelodysplastic syndrome -low blood counts, like low white cell, platelet, or red cell counts -take medicines that treat or prevent blood clots -an unusual or allergic reaction to romiplostim, mannitol, other medicines, foods, dyes, or preservatives -pregnant or trying to get pregnant -breast-feeding How should I use this medicine? This medicine is for injection under the skin. It is given by a health care professional in a hospital or clinic setting. A special MedGuide will be given to you before your injection. Read this information carefully each time. Talk to your pediatrician regarding the use of this medicine in children. Special care may be needed. Overdosage: If you think you have taken too much of this medicine contact a poison control center or emergency room at once. NOTE: This medicine is only for you. Do not share this medicine with others. What if I miss a dose? It is important not to miss your dose. Call your doctor or health care professional if you are unable to keep an appointment. What may interact with this medicine? Interactions are not expected. This list may not describe all possible interactions. Give your health care provider a list of all the medicines, herbs, non-prescription drugs, or dietary supplements you use. Also tell them if you smoke, drink alcohol, or use illegal drugs. Some items may interact with your medicine. What should I watch for while using this  medicine? Your condition will be monitored carefully while you are receiving this medicine. Visit your prescriber or health care professional for regular checks on your progress and for the needed blood tests. It is important to keep all appointments. What side effects may I notice from receiving this medicine? Side effects that you should report to your doctor or health care professional as soon as possible: -allergic reactions like skin rash, itching or hives, swelling of the face, lips, or tongue -shortness of breath, chest pain, swelling in a leg -unusual bleeding or bruising Side effects that usually do not require medical attention (report to your doctor or health care professional if they continue or are bothersome): -dizziness -headache -muscle aches -pain in arms and legs -stomach pain -trouble sleeping This list may not describe all possible side effects. Call your doctor for medical advice about side effects. You may report side effects to FDA at 1-800-FDA-1088. Where should I keep my medicine? This drug is given in a hospital or clinic and will not be stored at home. NOTE: This sheet is a summary. It may not cover all possible information. If you have questions about this medicine, talk to your doctor, pharmacist, or health care provider.  2018 Elsevier/Gold Standard (2008-04-23 15:13:04)  

## 2017-08-04 NOTE — Telephone Encounter (Signed)
Sent to the pharmacy by e-scribe for 6 months.  Last lipid done 12/2016.

## 2017-08-10 DIAGNOSIS — M5134 Other intervertebral disc degeneration, thoracic region: Secondary | ICD-10-CM | POA: Diagnosis not present

## 2017-08-10 DIAGNOSIS — M546 Pain in thoracic spine: Secondary | ICD-10-CM | POA: Diagnosis not present

## 2017-08-10 DIAGNOSIS — M9902 Segmental and somatic dysfunction of thoracic region: Secondary | ICD-10-CM | POA: Diagnosis not present

## 2017-08-10 DIAGNOSIS — M47814 Spondylosis without myelopathy or radiculopathy, thoracic region: Secondary | ICD-10-CM | POA: Diagnosis not present

## 2017-08-11 ENCOUNTER — Other Ambulatory Visit (HOSPITAL_BASED_OUTPATIENT_CLINIC_OR_DEPARTMENT_OTHER): Payer: Medicare Other

## 2017-08-11 ENCOUNTER — Other Ambulatory Visit: Payer: Self-pay

## 2017-08-11 ENCOUNTER — Ambulatory Visit (HOSPITAL_BASED_OUTPATIENT_CLINIC_OR_DEPARTMENT_OTHER): Payer: Medicare Other

## 2017-08-11 VITALS — BP 161/52 | HR 65 | Temp 98.2°F | Resp 18

## 2017-08-11 DIAGNOSIS — D696 Thrombocytopenia, unspecified: Secondary | ICD-10-CM

## 2017-08-11 DIAGNOSIS — N184 Chronic kidney disease, stage 4 (severe): Secondary | ICD-10-CM

## 2017-08-11 LAB — CBC WITH DIFFERENTIAL (CANCER CENTER ONLY)
BASO#: 0 10*3/uL (ref 0.0–0.2)
BASO%: 0.4 % (ref 0.0–2.0)
EOS%: 4.2 % (ref 0.0–7.0)
Eosinophils Absolute: 0.2 10*3/uL (ref 0.0–0.5)
HCT: 32.8 % — ABNORMAL LOW (ref 34.8–46.6)
HGB: 11 g/dL — ABNORMAL LOW (ref 11.6–15.9)
LYMPH#: 1.3 10*3/uL (ref 0.9–3.3)
LYMPH%: 23.7 % (ref 14.0–48.0)
MCH: 33.2 pg (ref 26.0–34.0)
MCHC: 33.5 g/dL (ref 32.0–36.0)
MCV: 99 fL (ref 81–101)
MONO#: 0.4 10*3/uL (ref 0.1–0.9)
MONO%: 7.1 % (ref 0.0–13.0)
NEUT#: 3.7 10*3/uL (ref 1.5–6.5)
NEUT%: 64.6 % (ref 39.6–80.0)
Platelets: 86 10*3/uL — ABNORMAL LOW (ref 145–400)
RBC: 3.31 10*6/uL — ABNORMAL LOW (ref 3.70–5.32)
RDW: 17.9 % — ABNORMAL HIGH (ref 11.1–15.7)
WBC: 5.7 10*3/uL (ref 3.9–10.0)

## 2017-08-11 MED ORDER — ROMIPLOSTIM INJECTION 500 MCG
4.0000 ug/kg | Freq: Once | SUBCUTANEOUS | Status: AC
  Administered 2017-08-11: 450 ug via SUBCUTANEOUS
  Filled 2017-08-11: qty 0.9

## 2017-08-11 NOTE — Patient Instructions (Signed)
Romiplostim injection What is this medicine? ROMIPLOSTIM (roe mi PLOE stim) helps your body make more platelets. This medicine is used to treat low platelets caused by chronic idiopathic thrombocytopenic purpura (ITP). This medicine may be used for other purposes; ask your health care provider or pharmacist if you have questions. COMMON BRAND NAME(S): Nplate What should I tell my health care provider before I take this medicine? They need to know if you have any of these conditions: -cancer or myelodysplastic syndrome -low blood counts, like low white cell, platelet, or red cell counts -take medicines that treat or prevent blood clots -an unusual or allergic reaction to romiplostim, mannitol, other medicines, foods, dyes, or preservatives -pregnant or trying to get pregnant -breast-feeding How should I use this medicine? This medicine is for injection under the skin. It is given by a health care professional in a hospital or clinic setting. A special MedGuide will be given to you before your injection. Read this information carefully each time. Talk to your pediatrician regarding the use of this medicine in children. Special care may be needed. Overdosage: If you think you have taken too much of this medicine contact a poison control center or emergency room at once. NOTE: This medicine is only for you. Do not share this medicine with others. What if I miss a dose? It is important not to miss your dose. Call your doctor or health care professional if you are unable to keep an appointment. What may interact with this medicine? Interactions are not expected. This list may not describe all possible interactions. Give your health care provider a list of all the medicines, herbs, non-prescription drugs, or dietary supplements you use. Also tell them if you smoke, drink alcohol, or use illegal drugs. Some items may interact with your medicine. What should I watch for while using this  medicine? Your condition will be monitored carefully while you are receiving this medicine. Visit your prescriber or health care professional for regular checks on your progress and for the needed blood tests. It is important to keep all appointments. What side effects may I notice from receiving this medicine? Side effects that you should report to your doctor or health care professional as soon as possible: -allergic reactions like skin rash, itching or hives, swelling of the face, lips, or tongue -shortness of breath, chest pain, swelling in a leg -unusual bleeding or bruising Side effects that usually do not require medical attention (report to your doctor or health care professional if they continue or are bothersome): -dizziness -headache -muscle aches -pain in arms and legs -stomach pain -trouble sleeping This list may not describe all possible side effects. Call your doctor for medical advice about side effects. You may report side effects to FDA at 1-800-FDA-1088. Where should I keep my medicine? This drug is given in a hospital or clinic and will not be stored at home. NOTE: This sheet is a summary. It may not cover all possible information. If you have questions about this medicine, talk to your doctor, pharmacist, or health care provider.  2018 Elsevier/Gold Standard (2008-04-23 15:13:04)  

## 2017-08-18 ENCOUNTER — Other Ambulatory Visit: Payer: Medicare Other

## 2017-08-18 ENCOUNTER — Ambulatory Visit: Payer: Medicare Other

## 2017-08-18 ENCOUNTER — Ambulatory Visit: Payer: Medicare Other | Admitting: Hematology & Oncology

## 2017-08-19 ENCOUNTER — Other Ambulatory Visit: Payer: Self-pay

## 2017-08-19 ENCOUNTER — Ambulatory Visit (HOSPITAL_BASED_OUTPATIENT_CLINIC_OR_DEPARTMENT_OTHER): Payer: Medicare Other

## 2017-08-19 ENCOUNTER — Ambulatory Visit (HOSPITAL_BASED_OUTPATIENT_CLINIC_OR_DEPARTMENT_OTHER): Payer: Medicare Other | Admitting: Family

## 2017-08-19 ENCOUNTER — Other Ambulatory Visit (HOSPITAL_BASED_OUTPATIENT_CLINIC_OR_DEPARTMENT_OTHER): Payer: Medicare Other

## 2017-08-19 VITALS — BP 154/42 | HR 75 | Temp 98.2°F | Resp 18 | Wt 246.0 lb

## 2017-08-19 DIAGNOSIS — D696 Thrombocytopenia, unspecified: Secondary | ICD-10-CM

## 2017-08-19 DIAGNOSIS — D500 Iron deficiency anemia secondary to blood loss (chronic): Secondary | ICD-10-CM | POA: Diagnosis not present

## 2017-08-19 DIAGNOSIS — D693 Immune thrombocytopenic purpura: Secondary | ICD-10-CM | POA: Diagnosis not present

## 2017-08-19 DIAGNOSIS — Z794 Long term (current) use of insulin: Principal | ICD-10-CM

## 2017-08-19 DIAGNOSIS — E1122 Type 2 diabetes mellitus with diabetic chronic kidney disease: Secondary | ICD-10-CM | POA: Diagnosis not present

## 2017-08-19 DIAGNOSIS — N184 Chronic kidney disease, stage 4 (severe): Secondary | ICD-10-CM | POA: Diagnosis not present

## 2017-08-19 LAB — CBC WITH DIFFERENTIAL (CANCER CENTER ONLY)
BASO#: 0 10*3/uL (ref 0.0–0.2)
BASO%: 0.3 % (ref 0.0–2.0)
EOS%: 3.6 % (ref 0.0–7.0)
Eosinophils Absolute: 0.2 10*3/uL (ref 0.0–0.5)
HCT: 33.7 % — ABNORMAL LOW (ref 34.8–46.6)
HGB: 11.3 g/dL — ABNORMAL LOW (ref 11.6–15.9)
LYMPH#: 1.4 10*3/uL (ref 0.9–3.3)
LYMPH%: 23.7 % (ref 14.0–48.0)
MCH: 33.1 pg (ref 26.0–34.0)
MCHC: 33.5 g/dL (ref 32.0–36.0)
MCV: 99 fL (ref 81–101)
MONO#: 0.4 10*3/uL (ref 0.1–0.9)
MONO%: 6.9 % (ref 0.0–13.0)
NEUT#: 3.8 10*3/uL (ref 1.5–6.5)
NEUT%: 65.5 % (ref 39.6–80.0)
Platelets: 69 10*3/uL — ABNORMAL LOW (ref 145–400)
RBC: 3.41 10*6/uL — ABNORMAL LOW (ref 3.70–5.32)
RDW: 17.3 % — ABNORMAL HIGH (ref 11.1–15.7)
WBC: 5.8 10*3/uL (ref 3.9–10.0)

## 2017-08-19 LAB — CMP (CANCER CENTER ONLY)
ALT(SGPT): 37 U/L (ref 10–47)
AST: 36 U/L (ref 11–38)
Albumin: 4 g/dL (ref 3.3–5.5)
Alkaline Phosphatase: 46 U/L (ref 26–84)
BUN, Bld: 31 mg/dL — ABNORMAL HIGH (ref 7–22)
CO2: 33 mEq/L (ref 18–33)
Calcium: 9.6 mg/dL (ref 8.0–10.3)
Chloride: 98 mEq/L (ref 98–108)
Creat: 2.2 mg/dl — ABNORMAL HIGH (ref 0.6–1.2)
Glucose, Bld: 203 mg/dL — ABNORMAL HIGH (ref 73–118)
Potassium: 4.5 mEq/L (ref 3.3–4.7)
Sodium: 146 mEq/L — ABNORMAL HIGH (ref 128–145)
Total Bilirubin: 1 mg/dl (ref 0.20–1.60)
Total Protein: 7.7 g/dL (ref 6.4–8.1)

## 2017-08-19 LAB — LACTATE DEHYDROGENASE: LDH: 228 U/L (ref 125–245)

## 2017-08-19 MED ORDER — ROMIPLOSTIM INJECTION 500 MCG
450.0000 ug | Freq: Once | SUBCUTANEOUS | Status: AC
  Administered 2017-08-19: 450 ug via SUBCUTANEOUS
  Filled 2017-08-19: qty 0.9

## 2017-08-19 NOTE — Progress Notes (Signed)
Hematology and Oncology Follow Up Visit  [name] [id number] 04-25-1935 82 y.o. 08/19/2017   Principle Diagnosis:  Immune based thrombocytopenia History of PE Iron deficiency anemia  Current Therapy:   Nplate as indicated  IV Iron as indicated - dose given on 07/07/2017   Interim History:  [name] is here today with her daughter for follow-up. She is doing well and has no complaints at this time.  Platelet count is 69 today. Hgb is 11.3. She has had no episodes of bleeding or petechiae. She does bruise easily but not in excess.  She is exercising on her stationary bike 3 times a day. She also would like to start swimming again.  No fever, chills, n/v, cough, rash, dizziness, SOB, chest pain, palpitations, abdominal pain or changes in bowel or bladder habits.  She has had no swelling, tenderness, numbness or tingling in her extremeties. No c/o pain.  She has had a good appetite and is staying well hydrated. Her weight is stable.   ECOG Performance Status: 0 - Asymptomatic  Medications:  Allergies as of 08/19/2017      Reactions   Apixaban Other (See Comments)   Internal Bleeding Patient states internal bleeding.   Aspirin Itching, Rash, Hives, Swelling   Swelling of her tongue   Mirabegron Other (See Comments)   Patient experienced A-Fib   Metformin Diarrhea, Nausea Only   Pineapple Swelling   Throat swells and blisters on tongue and roof of mouth per patient   Tetracycline Hives   Fluticasone-salmeterol Itching, Rash   Iodinated Diagnostic Agents Itching, Rash      Lactose Intolerance (gi) Other (See Comments)   gas   Latex Itching, Rash, Other (See Comments)   Pt. States latex pulls her skin off.    Lisinopril Cough   Metronidazole Other (See Comments)   Unknown   Sulfa Antibiotics Rash   Sulfonamide Derivatives Itching, Rash      Medication List        Accurate as of 08/19/17  2:33 PM. Always use your most recent med list.            atorvastatin 40 MG tablet Commonly known as:  LIPITOR Take 40 mg by mouth daily.   BIOTIN PO Take 1 tablet by mouth daily.   CALCIUM 1200 PO Take 1 tablet by mouth daily.   Coenzyme Q10 200 MG capsule Take 200 mg by mouth daily.   docusate sodium 100 MG capsule Commonly known as:  COLACE Take 1 capsule by mouth daily.   fenofibrate 54 MG tablet TAKE 1 TABLET(54 MG) BY MOUTH DAILY   fexofenadine 180 MG tablet Commonly known as:  ALLEGRA Take 180 mg by mouth daily.   furosemide 20 MG tablet Commonly known as:  LASIX Take 1 tablet (20 mg total) by mouth daily as needed.   glipiZIDE 10 MG tablet Commonly known as:  GLUCOTROL Take 10 mg by mouth 2 (two) times daily before a meal.   HUMULIN 70/30 (70-30) 100 UNIT/ML injection Generic drug:  insulin NPH-regular Human Inject 70 units under the skin in the morning, 15 units at noon and 65 units every evening.   Insulin Pen Needle 31G X 5 MM Misc Commonly known as:  B-D UF III MINI PEN NEEDLES Use daily for insulin injection   INSULIN SYRINGE 1CC/30GX5/16" 30G X 5/16" 1 ML Misc USE BID UTD   levothyroxine 112 MCG tablet Commonly known as:  SYNTHROID, LEVOTHROID Take 1 tablet (112 mcg total) by mouth daily.  multivitamin tablet Take 1 tablet by mouth daily.   potassium chloride SA 20 MEQ tablet Commonly known as:  K-DUR,KLOR-CON Take 20 mEq by mouth 2 (two) times daily.   PROBIOTIC PO Take 1 capsule by mouth daily.   ranitidine 150 MG tablet Commonly known as:  ZANTAC Take 150 mg by mouth daily as needed for heartburn.   sertraline 50 MG tablet Commonly known as:  ZOLOFT Take 1 tablet (50 mg total) daily by mouth.   vitamin C 250 MG tablet Commonly known as:  ASCORBIC ACID Take 250 mg by mouth daily.   Vitamin D3 2000 units Tabs Take 2,000 Units by mouth daily.       Allergies:  Allergies  Allergen Reactions  . Apixaban Other (See Comments)    Internal Bleeding  Patient states internal  bleeding.  . Aspirin Itching, Rash, Hives and Swelling    Swelling of her tongue  . Mirabegron Other (See Comments)    Patient experienced A-Fib  . Metformin Diarrhea and Nausea Only  . Pineapple Swelling    Throat swells and blisters on tongue and roof of mouth per patient  . Tetracycline Hives  . Fluticasone-Salmeterol Itching and Rash  . Iodinated Diagnostic Agents Itching and Rash       . Lactose Intolerance (Gi) Other (See Comments)    gas  . Latex Itching, Rash and Other (See Comments)    Pt. States latex pulls her skin off.   . Lisinopril Cough  . Metronidazole Other (See Comments)    Unknown   . Sulfa Antibiotics Rash  . Sulfonamide Derivatives Itching and Rash    Past Medical History, Surgical history, Social history, and Family History were reviewed and updated.  Review of Systems: All other 10 point review of systems is negative.   Physical Exam:  weight is 246 lb (111.6 kg). Her oral temperature is 98.2 F (36.8 C). Her blood pressure is 154/42 (abnormal) and her pulse is 75. Her respiration is 18 and oxygen saturation is 97%.   Wt Readings from Last 3 Encounters:  08/19/17 246 lb (111.6 kg)  07/28/17 247 lb 8 oz (112.3 kg)  06/30/17 246 lb 6.4 oz (111.8 kg)    Ocular: Sclerae unicteric, pupils equal, round and reactive to light Ear-nose-throat: Oropharynx clear, dentition fair Lymphatic: No cervical, supraclavicular or axillary adenopathy Lungs no rales or rhonchi, good excursion bilaterally Heart regular rate and rhythm, no murmur appreciated Abd soft, nontender, positive bowel sounds, no liver or spleen tip palpated on exam, no fluid wave  MSK no focal spinal tenderness, no joint edema Neuro: non-focal, well-oriented, appropriate affect Breasts: Deferred  Lab Results  Component Value Date   WBC 5.8 08/19/2017   HGB 11.3 (L) 08/19/2017   HCT 33.7 (L) 08/19/2017   MCV 99 08/19/2017   PLT 69 (L) 08/19/2017   Lab Results  Component Value Date    FERRITIN 222 07/28/2017   IRON 101 07/28/2017   TIBC 393 07/28/2017   UIBC 291 07/28/2017   IRONPCTSAT 26 07/28/2017   Lab Results  Component Value Date   RETICCTPCT 1.9 01/28/2011   RBC 3.41 (L) 08/19/2017   RETICCTABS 73.3 01/28/2011   No results found for: KPAFRELGTCHN, LAMBDASER, KAPLAMBRATIO No results found for: IGGSERUM, IGA, IGMSERUM No results found for: TOTALPROTELP, ALBUMINELP, A1GS, A2GS, BETS, BETA2SER, GAMS, MSPIKE, SPEI   Chemistry      Component Value Date/Time   NA 146 (H) 08/19/2017 1400   NA 142 02/25/2017 1013   K 4.5  08/19/2017 1400   K 3.8 02/25/2017 1013   CL 98 08/19/2017 1400   CO2 33 08/19/2017 1400   CO2 26 02/25/2017 1013   BUN 31 (H) 08/19/2017 1400   BUN 20.1 02/25/2017 1013   CREATININE 2.2 (H) 08/19/2017 1400   CREATININE 1.8 (H) 02/25/2017 1013      Component Value Date/Time   CALCIUM 9.6 08/19/2017 1400   CALCIUM 8.7 02/25/2017 1013   ALKPHOS 46 08/19/2017 1400   ALKPHOS 65 02/25/2017 1013   AST 36 08/19/2017 1400   AST 16 02/25/2017 1013   ALT 37 08/19/2017 1400   ALT 13 02/25/2017 1013   BILITOT 1.00 08/19/2017 1400   BILITOT 0.76 02/25/2017 1013      Impression and Plan: Ms. Mcneeley is a very pleasant 82 yo caucasian female with immune based thrombocytopenia. Platelet count today is 69. She is asymptomatic at this time. I spoke with Dr. Marin Olp and we will continue the same regimen with Nplate.  We will continue to see her weekly for lab and injection for now with follow-up in 3 weeks.  She will contact our office with any questions or concerns. We can certainly see her sooner if need be.   Laverna Peace, NP 12/13/20182:33 PM

## 2017-08-20 ENCOUNTER — Telehealth: Payer: Self-pay | Admitting: *Deleted

## 2017-08-20 LAB — HEMOGLOBIN A1C
Est. average glucose Bld gHb Est-mCnc: 157 mg/dL
Hemoglobin A1c: 7.1 % — ABNORMAL HIGH (ref 4.8–5.6)

## 2017-08-20 NOTE — Telephone Encounter (Signed)
Patient experienced nose bleed for approx five minutes this morning. It has stopped. She is notifying the office due to her platelets being low and receiving nplate.   While reviewing event, patient admits to not wearing her CPAP last night and not having her humidified air. Her caregiver felt like it was related to dry heat in the house.   Notified Laverna Peace NP of event. She states if patient experiences nose bleed again of greater than 10 minutes to call the office or on-call service she understands.

## 2017-08-24 DIAGNOSIS — M546 Pain in thoracic spine: Secondary | ICD-10-CM | POA: Diagnosis not present

## 2017-08-24 DIAGNOSIS — M5134 Other intervertebral disc degeneration, thoracic region: Secondary | ICD-10-CM | POA: Diagnosis not present

## 2017-08-24 DIAGNOSIS — M47814 Spondylosis without myelopathy or radiculopathy, thoracic region: Secondary | ICD-10-CM | POA: Diagnosis not present

## 2017-08-24 DIAGNOSIS — M9902 Segmental and somatic dysfunction of thoracic region: Secondary | ICD-10-CM | POA: Diagnosis not present

## 2017-08-25 ENCOUNTER — Other Ambulatory Visit (HOSPITAL_BASED_OUTPATIENT_CLINIC_OR_DEPARTMENT_OTHER): Payer: Medicare Other

## 2017-08-25 ENCOUNTER — Ambulatory Visit (HOSPITAL_BASED_OUTPATIENT_CLINIC_OR_DEPARTMENT_OTHER): Payer: Medicare Other

## 2017-08-25 VITALS — BP 164/56 | HR 68 | Temp 98.3°F | Resp 18

## 2017-08-25 DIAGNOSIS — D693 Immune thrombocytopenic purpura: Secondary | ICD-10-CM | POA: Diagnosis present

## 2017-08-25 DIAGNOSIS — D696 Thrombocytopenia, unspecified: Secondary | ICD-10-CM

## 2017-08-25 LAB — CBC WITH DIFFERENTIAL (CANCER CENTER ONLY)
BASO#: 0 10*3/uL (ref 0.0–0.2)
BASO%: 0.4 % (ref 0.0–2.0)
EOS%: 3.9 % (ref 0.0–7.0)
Eosinophils Absolute: 0.2 10*3/uL (ref 0.0–0.5)
HCT: 32 % — ABNORMAL LOW (ref 34.8–46.6)
HGB: 10.6 g/dL — ABNORMAL LOW (ref 11.6–15.9)
LYMPH#: 1.2 10*3/uL (ref 0.9–3.3)
LYMPH%: 23 % (ref 14.0–48.0)
MCH: 32.8 pg (ref 26.0–34.0)
MCHC: 33.1 g/dL (ref 32.0–36.0)
MCV: 99 fL (ref 81–101)
MONO#: 0.4 10*3/uL (ref 0.1–0.9)
MONO%: 8.3 % (ref 0.0–13.0)
NEUT#: 3.3 10*3/uL (ref 1.5–6.5)
NEUT%: 64.4 % (ref 39.6–80.0)
Platelets: 64 10*3/uL — ABNORMAL LOW (ref 145–400)
RBC: 3.23 10*6/uL — ABNORMAL LOW (ref 3.70–5.32)
RDW: 17.2 % — ABNORMAL HIGH (ref 11.1–15.7)
WBC: 5.2 10*3/uL (ref 3.9–10.0)

## 2017-08-25 MED ORDER — ROMIPLOSTIM INJECTION 500 MCG
450.0000 ug | Freq: Once | SUBCUTANEOUS | Status: AC
  Administered 2017-08-25: 450 ug via SUBCUTANEOUS
  Filled 2017-08-25: qty 0.9

## 2017-08-25 NOTE — Patient Instructions (Signed)
Romiplostim injection What is this medicine? ROMIPLOSTIM (roe mi PLOE stim) helps your body make more platelets. This medicine is used to treat low platelets caused by chronic idiopathic thrombocytopenic purpura (ITP). This medicine may be used for other purposes; ask your health care provider or pharmacist if you have questions. COMMON BRAND NAME(S): Nplate What should I tell my health care provider before I take this medicine? They need to know if you have any of these conditions: -cancer or myelodysplastic syndrome -low blood counts, like low white cell, platelet, or red cell counts -take medicines that treat or prevent blood clots -an unusual or allergic reaction to romiplostim, mannitol, other medicines, foods, dyes, or preservatives -pregnant or trying to get pregnant -breast-feeding How should I use this medicine? This medicine is for injection under the skin. It is given by a health care professional in a hospital or clinic setting. A special MedGuide will be given to you before your injection. Read this information carefully each time. Talk to your pediatrician regarding the use of this medicine in children. Special care may be needed. Overdosage: If you think you have taken too much of this medicine contact a poison control center or emergency room at once. NOTE: This medicine is only for you. Do not share this medicine with others. What if I miss a dose? It is important not to miss your dose. Call your doctor or health care professional if you are unable to keep an appointment. What may interact with this medicine? Interactions are not expected. This list may not describe all possible interactions. Give your health care provider a list of all the medicines, herbs, non-prescription drugs, or dietary supplements you use. Also tell them if you smoke, drink alcohol, or use illegal drugs. Some items may interact with your medicine. What should I watch for while using this  medicine? Your condition will be monitored carefully while you are receiving this medicine. Visit your prescriber or health care professional for regular checks on your progress and for the needed blood tests. It is important to keep all appointments. What side effects may I notice from receiving this medicine? Side effects that you should report to your doctor or health care professional as soon as possible: -allergic reactions like skin rash, itching or hives, swelling of the face, lips, or tongue -shortness of breath, chest pain, swelling in a leg -unusual bleeding or bruising Side effects that usually do not require medical attention (report to your doctor or health care professional if they continue or are bothersome): -dizziness -headache -muscle aches -pain in arms and legs -stomach pain -trouble sleeping This list may not describe all possible side effects. Call your doctor for medical advice about side effects. You may report side effects to FDA at 1-800-FDA-1088. Where should I keep my medicine? This drug is given in a hospital or clinic and will not be stored at home. NOTE: This sheet is a summary. It may not cover all possible information. If you have questions about this medicine, talk to your doctor, pharmacist, or health care provider.  2018 Elsevier/Gold Standard (2008-04-23 15:13:04)  

## 2017-09-01 ENCOUNTER — Other Ambulatory Visit: Payer: Medicare Other

## 2017-09-01 ENCOUNTER — Ambulatory Visit: Payer: Medicare Other

## 2017-09-03 ENCOUNTER — Ambulatory Visit (HOSPITAL_BASED_OUTPATIENT_CLINIC_OR_DEPARTMENT_OTHER): Payer: Medicare Other

## 2017-09-03 ENCOUNTER — Other Ambulatory Visit (HOSPITAL_BASED_OUTPATIENT_CLINIC_OR_DEPARTMENT_OTHER): Payer: Medicare Other

## 2017-09-03 ENCOUNTER — Other Ambulatory Visit: Payer: Self-pay

## 2017-09-03 VITALS — BP 150/72 | HR 73 | Temp 97.7°F | Resp 18 | Wt 252.8 lb

## 2017-09-03 DIAGNOSIS — D693 Immune thrombocytopenic purpura: Secondary | ICD-10-CM

## 2017-09-03 DIAGNOSIS — D696 Thrombocytopenia, unspecified: Secondary | ICD-10-CM

## 2017-09-03 LAB — CBC WITH DIFFERENTIAL (CANCER CENTER ONLY)
BASO#: 0 10*3/uL (ref 0.0–0.2)
BASO%: 0.4 % (ref 0.0–2.0)
EOS%: 4.7 % (ref 0.0–7.0)
Eosinophils Absolute: 0.3 10*3/uL (ref 0.0–0.5)
HCT: 31.9 % — ABNORMAL LOW (ref 34.8–46.6)
HGB: 10.6 g/dL — ABNORMAL LOW (ref 11.6–15.9)
LYMPH#: 1.2 10*3/uL (ref 0.9–3.3)
LYMPH%: 22.3 % (ref 14.0–48.0)
MCH: 32.8 pg (ref 26.0–34.0)
MCHC: 33.2 g/dL (ref 32.0–36.0)
MCV: 99 fL (ref 81–101)
MONO#: 0.5 10*3/uL (ref 0.1–0.9)
MONO%: 8.5 % (ref 0.0–13.0)
NEUT#: 3.4 10*3/uL (ref 1.5–6.5)
NEUT%: 64.1 % (ref 39.6–80.0)
Platelets: 97 10*3/uL — ABNORMAL LOW (ref 145–400)
RBC: 3.23 10*6/uL — ABNORMAL LOW (ref 3.70–5.32)
RDW: 17.3 % — ABNORMAL HIGH (ref 11.1–15.7)
WBC: 5.3 10*3/uL (ref 3.9–10.0)

## 2017-09-03 MED ORDER — ROMIPLOSTIM INJECTION 500 MCG
450.0000 ug | Freq: Once | SUBCUTANEOUS | Status: AC
  Administered 2017-09-03: 450 ug via SUBCUTANEOUS
  Filled 2017-09-03: qty 0.9

## 2017-09-03 NOTE — Patient Instructions (Signed)
Romiplostim injection (NPLATE) What is this medicine? ROMIPLOSTIM (roe mi PLOE stim) helps your body make more platelets. This medicine is used to treat low platelets caused by chronic idiopathic thrombocytopenic purpura (ITP). This medicine may be used for other purposes; ask your health care provider or pharmacist if you have questions. COMMON BRAND NAME(S): Nplate What should I tell my health care provider before I take this medicine? They need to know if you have any of these conditions: -cancer or myelodysplastic syndrome -low blood counts, like low white cell, platelet, or red cell counts -take medicines that treat or prevent blood clots -an unusual or allergic reaction to romiplostim, mannitol, other medicines, foods, dyes, or preservatives -pregnant or trying to get pregnant -breast-feeding How should I use this medicine? This medicine is for injection under the skin. It is given by a health care professional in a hospital or clinic setting. A special MedGuide will be given to you before your injection. Read this information carefully each time. Talk to your pediatrician regarding the use of this medicine in children. Special care may be needed. Overdosage: If you think you have taken too much of this medicine contact a poison control center or emergency room at once. NOTE: This medicine is only for you. Do not share this medicine with others. What if I miss a dose? It is important not to miss your dose. Call your doctor or health care professional if you are unable to keep an appointment. What may interact with this medicine? Interactions are not expected. This list may not describe all possible interactions. Give your health care provider a list of all the medicines, herbs, non-prescription drugs, or dietary supplements you use. Also tell them if you smoke, drink alcohol, or use illegal drugs. Some items may interact with your medicine. What should I watch for while using this  medicine? Your condition will be monitored carefully while you are receiving this medicine. Visit your prescriber or health care professional for regular checks on your progress and for the needed blood tests. It is important to keep all appointments. What side effects may I notice from receiving this medicine? Side effects that you should report to your doctor or health care professional as soon as possible: -allergic reactions like skin rash, itching or hives, swelling of the face, lips, or tongue -shortness of breath, chest pain, swelling in a leg -unusual bleeding or bruising Side effects that usually do not require medical attention (report to your doctor or health care professional if they continue or are bothersome): -dizziness -headache -muscle aches -pain in arms and legs -stomach pain -trouble sleeping This list may not describe all possible side effects. Call your doctor for medical advice about side effects. You may report side effects to FDA at 1-800-FDA-1088. Where should I keep my medicine? This drug is given in a hospital or clinic and will not be stored at home. NOTE: This sheet is a summary. It may not cover all possible information. If you have questions about this medicine, talk to your doctor, pharmacist, or health care provider.  2018 Elsevier/Gold Standard (2008-04-23 15:13:04)  

## 2017-09-06 ENCOUNTER — Telehealth: Payer: Self-pay | Admitting: *Deleted

## 2017-09-06 NOTE — Telephone Encounter (Signed)
Received a message on the RN triage line from Saturday morning. Patient believes she has the Flu.  Returned the call this morning. Patient is still feeling poorly. She denies fever. She has congestion in her chest and sinuses. Productive cough with clear mucous.   She is treating symptoms with tylenol and mucinex.  Reviewed symptoms with [facility]. Patient is fine to treat at home. She does not need to be seen. Reviewed with patient that in the future, she would contact her primary care, or seek assessment at an Urgent Care for this type of illness. She understood.  Patient has an already scheduled appointment on Wednesday for Nplate. We'll reassess then.

## 2017-09-08 ENCOUNTER — Ambulatory Visit (HOSPITAL_BASED_OUTPATIENT_CLINIC_OR_DEPARTMENT_OTHER): Payer: Medicare Other

## 2017-09-08 ENCOUNTER — Other Ambulatory Visit (HOSPITAL_BASED_OUTPATIENT_CLINIC_OR_DEPARTMENT_OTHER): Payer: Medicare Other

## 2017-09-08 ENCOUNTER — Encounter: Payer: Self-pay | Admitting: Family

## 2017-09-08 ENCOUNTER — Ambulatory Visit (HOSPITAL_BASED_OUTPATIENT_CLINIC_OR_DEPARTMENT_OTHER): Payer: Medicare Other | Admitting: Family

## 2017-09-08 ENCOUNTER — Other Ambulatory Visit: Payer: Self-pay

## 2017-09-08 VITALS — BP 186/49 | HR 69 | Temp 98.7°F | Resp 20 | Wt 248.4 lb

## 2017-09-08 DIAGNOSIS — D696 Thrombocytopenia, unspecified: Secondary | ICD-10-CM

## 2017-09-08 DIAGNOSIS — Z86711 Personal history of pulmonary embolism: Secondary | ICD-10-CM | POA: Diagnosis not present

## 2017-09-08 DIAGNOSIS — D693 Immune thrombocytopenic purpura: Secondary | ICD-10-CM

## 2017-09-08 DIAGNOSIS — D509 Iron deficiency anemia, unspecified: Secondary | ICD-10-CM

## 2017-09-08 LAB — CBC WITH DIFFERENTIAL (CANCER CENTER ONLY)
BASO#: 0 10*3/uL (ref 0.0–0.2)
BASO%: 0.4 % (ref 0.0–2.0)
EOS%: 5.3 % (ref 0.0–7.0)
Eosinophils Absolute: 0.3 10*3/uL (ref 0.0–0.5)
HCT: 33.2 % — ABNORMAL LOW (ref 34.8–46.6)
HGB: 11 g/dL — ABNORMAL LOW (ref 11.6–15.9)
LYMPH#: 1.2 10*3/uL (ref 0.9–3.3)
LYMPH%: 24.2 % (ref 14.0–48.0)
MCH: 32.6 pg (ref 26.0–34.0)
MCHC: 33.1 g/dL (ref 32.0–36.0)
MCV: 99 fL (ref 81–101)
MONO#: 0.4 10*3/uL (ref 0.1–0.9)
MONO%: 7 % (ref 0.0–13.0)
NEUT#: 3.2 10*3/uL (ref 1.5–6.5)
NEUT%: 63.1 % (ref 39.6–80.0)
Platelets: 75 10*3/uL — ABNORMAL LOW (ref 145–400)
RBC: 3.37 10*6/uL — ABNORMAL LOW (ref 3.70–5.32)
RDW: 17.1 % — ABNORMAL HIGH (ref 11.1–15.7)
WBC: 5.1 10*3/uL (ref 3.9–10.0)

## 2017-09-08 LAB — CMP (CANCER CENTER ONLY)
ALT(SGPT): 26 U/L (ref 10–47)
AST: 31 U/L (ref 11–38)
Albumin: 3.8 g/dL (ref 3.3–5.5)
Alkaline Phosphatase: 45 U/L (ref 26–84)
BUN, Bld: 34 mg/dL — ABNORMAL HIGH (ref 7–22)
CO2: 32 mEq/L (ref 18–33)
Calcium: 9.3 mg/dL (ref 8.0–10.3)
Chloride: 98 mEq/L (ref 98–108)
Creat: 1.9 mg/dl — ABNORMAL HIGH (ref 0.6–1.2)
Glucose, Bld: 251 mg/dL — ABNORMAL HIGH (ref 73–118)
Potassium: 4.5 mEq/L (ref 3.3–4.7)
Sodium: 145 mEq/L (ref 128–145)
Total Bilirubin: 0.9 mg/dl (ref 0.20–1.60)
Total Protein: 7.9 g/dL (ref 6.4–8.1)

## 2017-09-08 MED ORDER — ROMIPLOSTIM INJECTION 500 MCG
4.0000 ug/kg | Freq: Once | SUBCUTANEOUS | Status: AC
  Administered 2017-09-08: 450 ug via SUBCUTANEOUS
  Filled 2017-09-08: qty 0.9

## 2017-09-08 NOTE — Progress Notes (Signed)
Hematology and Oncology Follow Up Visit  GLENDI MOHIUDDIN [id number] 1935-01-22 83 y.o. 09/08/2017   Principle Diagnosis:  Immune based thrombocytopenia History of PE Iron deficiency anemia  Current Therapy:   Nplate as indicated IV Iron as indicated - dose given on 07/07/2017   Interim History:  Ms. Radu is here today for follow-up. She is getting a cold and has a productive cough with yellow/green sputum. She has wheezing in both the left and upper lobes. No c/o SOB, no fever. O2 sat 97% on room air. She has a home O2 monitor and is checking her sat regularly. We discussed doing a chest x-ray today but she states she feels ok enough to hold off for now.  She is taking Mucinex, Tylenol cold and sinus and Delsym cough syrup at home.  She has had no chills, n/v, rash, dizziness, chest pain, palpitations, abdominal pain or changes in bowel or bladder habits.  Platelet count is 75. She has had no episodes of bleeding. NO bruising or petechiae.  No lymphadenopathy noted at this time.   No swelling, tenderness, numbness or tingling in her extremities. No c/o pain.  No falls or syncopal episodes.  She has maintained a good appetite and is staying well hydrated. Her weight is stable.   ECOG Performance Status: 1 - Symptomatic but completely ambulatory  Medications:  Allergies as of 09/08/2017      Reactions   Apixaban Other (See Comments)   Internal Bleeding Patient states internal bleeding.   Aspirin Itching, Rash, Hives, Swelling   Swelling of her tongue   Mirabegron Other (See Comments)   Patient experienced A-Fib   Metformin Diarrhea, Nausea Only   Pineapple Swelling   Throat swells and blisters on tongue and roof of mouth per patient   Tetracycline Hives   Fluticasone-salmeterol Itching, Rash   Iodinated Diagnostic Agents Itching, Rash      Lactose Intolerance (gi) Other (See Comments)   gas   Latex Itching, Rash, Other (See Comments)   Pt. States latex pulls her skin  off.    Lisinopril Cough   Metronidazole Other (See Comments)   Unknown   Sulfa Antibiotics Rash   Sulfonamide Derivatives Itching, Rash      Medication List        Accurate as of 09/08/17  2:12 PM. Always use your most recent med list.          atorvastatin 40 MG tablet Commonly known as:  LIPITOR Take 40 mg by mouth daily.   BIOTIN PO Take 1 tablet by mouth daily.   CALCIUM 1200 PO Take 1 tablet by mouth daily.   Coenzyme Q10 200 MG capsule Take 200 mg by mouth daily.   docusate sodium 100 MG capsule Commonly known as:  COLACE Take 1 capsule by mouth daily.   fenofibrate 54 MG tablet TAKE 1 TABLET(54 MG) BY MOUTH DAILY   fexofenadine 180 MG tablet Commonly known as:  ALLEGRA Take 180 mg by mouth daily.   furosemide 20 MG tablet Commonly known as:  LASIX Take 1 tablet (20 mg total) by mouth daily as needed.   glipiZIDE 10 MG tablet Commonly known as:  GLUCOTROL Take 10 mg by mouth 2 (two) times daily before a meal.   HUMULIN 70/30 (70-30) 100 UNIT/ML injection Generic drug:  insulin NPH-regular Human Inject 70 units under the skin in the morning, 15 units at noon and 65 units every evening.   Insulin Pen Needle 31G X 5 MM Misc Commonly known  as:  B-D UF III MINI PEN NEEDLES Use daily for insulin injection   INSULIN SYRINGE 1CC/30GX5/16" 30G X 5/16" 1 ML Misc USE BID UTD   levothyroxine 112 MCG tablet Commonly known as:  SYNTHROID, LEVOTHROID Take 1 tablet (112 mcg total) by mouth daily.   multivitamin tablet Take 1 tablet by mouth daily.   potassium chloride SA 20 MEQ tablet Commonly known as:  K-DUR,KLOR-CON Take 20 mEq by mouth 2 (two) times daily.   PROBIOTIC PO Take 1 capsule by mouth daily.   ranitidine 150 MG tablet Commonly known as:  ZANTAC Take 150 mg by mouth daily as needed for heartburn.   sertraline 50 MG tablet Commonly known as:  ZOLOFT Take 1 tablet (50 mg total) daily by mouth.   vitamin C 250 MG tablet Commonly known  as:  ASCORBIC ACID Take 250 mg by mouth daily.   Vitamin D3 2000 units Tabs Take 2,000 Units by mouth daily.       Allergies:  Allergies  Allergen Reactions  . Apixaban Other (See Comments)    Internal Bleeding  Patient states internal bleeding.  . Aspirin Itching, Rash, Hives and Swelling    Swelling of her tongue  . Mirabegron Other (See Comments)    Patient experienced A-Fib  . Metformin Diarrhea and Nausea Only  . Pineapple Swelling    Throat swells and blisters on tongue and roof of mouth per patient  . Tetracycline Hives  . Fluticasone-Salmeterol Itching and Rash  . Iodinated Diagnostic Agents Itching and Rash       . Lactose Intolerance (Gi) Other (See Comments)    gas  . Latex Itching, Rash and Other (See Comments)    Pt. States latex pulls her skin off.   . Lisinopril Cough  . Metronidazole Other (See Comments)    Unknown   . Sulfa Antibiotics Rash  . Sulfonamide Derivatives Itching and Rash    Past Medical History, Surgical history, Social history, and Family History were reviewed and updated.  Review of Systems: All other 10 point review of systems is negative.   Physical Exam:  weight is 248 lb 6.4 oz (112.7 kg). Her oral temperature is 98.7 F (37.1 C). Her blood pressure is 186/49 (abnormal) and her pulse is 69. Her respiration is 20 and oxygen saturation is 97%.   Wt Readings from Last 3 Encounters:  09/08/17 248 lb 6.4 oz (112.7 kg)  09/03/17 252 lb 12 oz (114.6 kg)  08/19/17 246 lb (111.6 kg)    Ocular: Sclerae unicteric, pupils equal, round and reactive to light Ear-nose-throat: Oropharynx clear, dentition fair Lymphatic: No cervical, supraclavicular or axillary adenopathy Lungs no rales or rhonchi, good excursion bilaterally Heart regular rate and rhythm, no murmur appreciated Abd soft, nontender, positive bowel sounds, no liver or spleen tip palpated on exam, no fluid wave  MSK no focal spinal tenderness, no joint edema Neuro:  non-focal, well-oriented, appropriate affect Breasts: Deferred   Lab Results  Component Value Date   WBC 5.1 09/08/2017   HGB 11.0 (L) 09/08/2017   HCT 33.2 (L) 09/08/2017   MCV 99 09/08/2017   PLT 75 (L) 09/08/2017   Lab Results  Component Value Date   FERRITIN 222 07/28/2017   IRON 101 07/28/2017   TIBC 393 07/28/2017   UIBC 291 07/28/2017   IRONPCTSAT 26 07/28/2017   Lab Results  Component Value Date   RETICCTPCT 1.9 01/28/2011   RBC 3.37 (L) 09/08/2017   RETICCTABS 73.3 01/28/2011   No results  found for: KPAFRELGTCHN, LAMBDASER, KAPLAMBRATIO No results found for: IGGSERUM, IGA, IGMSERUM No results found for: Odetta Pink, SPEI   Chemistry      Component Value Date/Time   NA 145 09/08/2017 1317   NA 142 02/25/2017 1013   K 4.5 09/08/2017 1317   K 3.8 02/25/2017 1013   CL 98 09/08/2017 1317   CO2 32 09/08/2017 1317   CO2 26 02/25/2017 1013   BUN 34 (H) 09/08/2017 1317   BUN 20.1 02/25/2017 1013   CREATININE 1.9 (H) 09/08/2017 1317   CREATININE 1.8 (H) 02/25/2017 1013      Component Value Date/Time   CALCIUM 9.3 09/08/2017 1317   CALCIUM 8.7 02/25/2017 1013   ALKPHOS 45 09/08/2017 1317   ALKPHOS 65 02/25/2017 1013   AST 31 09/08/2017 1317   AST 16 02/25/2017 1013   ALT 26 09/08/2017 1317   ALT 13 02/25/2017 1013   BILITOT 0.90 09/08/2017 1317   BILITOT 0.76 02/25/2017 1013      Impression and Plan: Ms. Mitschke is a very pleasant 83 yo caucasian female with immune based thrombocytopenia. She is doing well on Nplate and platelet count today is stable at 75. Hgb is 11.0 with MCV of 99 and WBC count is 5.1.  She is getting over a cold with productive cough taking OTC cough syrup and sinus medication. We will continue to see her weekly for lab and Nplate with follow-up in 1 month.  She will contact our office with any questions or concerns. We can certainly see her sooner if need be.   Laverna Peace,  NP 1/2/20192:12 PM

## 2017-09-10 ENCOUNTER — Other Ambulatory Visit: Payer: Self-pay | Admitting: Adult Health

## 2017-09-14 ENCOUNTER — Telehealth: Payer: Self-pay | Admitting: Adult Health

## 2017-09-14 ENCOUNTER — Other Ambulatory Visit: Payer: Self-pay | Admitting: Internal Medicine

## 2017-09-14 MED ORDER — INSULIN NPH ISOPHANE & REGULAR (70-30) 100 UNIT/ML ~~LOC~~ SUSP: SUBCUTANEOUS | 3 refills | Status: DC

## 2017-09-14 NOTE — Telephone Encounter (Signed)
Copied from [id number] [id number] [id number]. Topic: Quick Communication - Rx Refill/Question >> Sep 14, 2017 11:19 AM [name] wrote: Has the patient contacted their pharmacy? yes  Pt is needing  insulin NPH-regular Human (HUMULIN 70/30) (70-30) 100 UNIT/ML injection [[id number]], pharmacy is waiting for refill approval since they sent it on the 4th last week, contact pharmacy if needed

## 2017-09-14 NOTE — Telephone Encounter (Signed)
Copied from [id number]. Topic: Quick Communication - Rx Refill/Question >> Sep 14, 2017  2:02 PM Arletha Grippe wrote: Has the patient contacted their pharmacy? Yes.     (Agent: If no, request that the patient contact the pharmacy for the refill.)   Preferred Pharmacy (with phone number or street name): [facility] from pharmacy called - they need refill on insulin NPH-regular Human (HUMULIN 70/30) (70-30) 100 UNIT/ML injection . Pt dosage has changed and pt is out of medication. Per Tommi Rumps NP, pt is taking 70units in am, 15 units at 12, and 75 units in pm.  Cb for pharmacy is # [phone]   Agent: Please be advised that RX refills may take up to 3 business days. We ask that you follow-up with your pharmacy.

## 2017-09-14 NOTE — Telephone Encounter (Signed)
OK I sent it.

## 2017-09-14 NOTE — Telephone Encounter (Signed)
Pt requesting a refill on insulin NPH-regular Human (HUMULIN 70/30) (70/30) 100 unit/ml. With a change in the dosages.

## 2017-09-15 ENCOUNTER — Inpatient Hospital Stay: Payer: Medicare Other

## 2017-09-15 ENCOUNTER — Emergency Department (HOSPITAL_BASED_OUTPATIENT_CLINIC_OR_DEPARTMENT_OTHER)
Admission: EM | Admit: 2017-09-15 | Discharge: 2017-09-15 | Disposition: A | Discharge status: Home / Self Care | Payer: Medicare Other | Attending: Emergency Medicine | Admitting: Emergency Medicine

## 2017-09-15 ENCOUNTER — Emergency Department (HOSPITAL_BASED_OUTPATIENT_CLINIC_OR_DEPARTMENT_OTHER): Payer: Medicare Other

## 2017-09-15 ENCOUNTER — Other Ambulatory Visit: Payer: Self-pay

## 2017-09-15 ENCOUNTER — Encounter (HOSPITAL_BASED_OUTPATIENT_CLINIC_OR_DEPARTMENT_OTHER): Payer: Self-pay | Admitting: *Deleted

## 2017-09-15 ENCOUNTER — Inpatient Hospital Stay: Payer: Medicare Other | Attending: Hematology & Oncology

## 2017-09-15 DIAGNOSIS — Z79899 Other long term (current) drug therapy: Secondary | ICD-10-CM | POA: Insufficient documentation

## 2017-09-15 DIAGNOSIS — Z86711 Personal history of pulmonary embolism: Secondary | ICD-10-CM | POA: Diagnosis not present

## 2017-09-15 DIAGNOSIS — I129 Hypertensive chronic kidney disease with stage 1 through stage 4 chronic kidney disease, or unspecified chronic kidney disease: Secondary | ICD-10-CM | POA: Diagnosis not present

## 2017-09-15 DIAGNOSIS — D693 Immune thrombocytopenic purpura: Secondary | ICD-10-CM | POA: Diagnosis not present

## 2017-09-15 DIAGNOSIS — Z91040 Latex allergy status: Secondary | ICD-10-CM | POA: Diagnosis not present

## 2017-09-15 DIAGNOSIS — I509 Heart failure, unspecified: Secondary | ICD-10-CM | POA: Diagnosis not present

## 2017-09-15 DIAGNOSIS — J209 Acute bronchitis, unspecified: Secondary | ICD-10-CM | POA: Diagnosis not present

## 2017-09-15 DIAGNOSIS — Z794 Long term (current) use of insulin: Secondary | ICD-10-CM | POA: Insufficient documentation

## 2017-09-15 DIAGNOSIS — E1122 Type 2 diabetes mellitus with diabetic chronic kidney disease: Secondary | ICD-10-CM | POA: Insufficient documentation

## 2017-09-15 DIAGNOSIS — I130 Hypertensive heart and chronic kidney disease with heart failure and stage 1 through stage 4 chronic kidney disease, or unspecified chronic kidney disease: Secondary | ICD-10-CM | POA: Insufficient documentation

## 2017-09-15 DIAGNOSIS — Z96659 Presence of unspecified artificial knee joint: Secondary | ICD-10-CM | POA: Diagnosis not present

## 2017-09-15 DIAGNOSIS — R42 Dizziness and giddiness: Secondary | ICD-10-CM | POA: Diagnosis present

## 2017-09-15 DIAGNOSIS — N189 Chronic kidney disease, unspecified: Secondary | ICD-10-CM | POA: Diagnosis not present

## 2017-09-15 DIAGNOSIS — R0602 Shortness of breath: Secondary | ICD-10-CM | POA: Diagnosis not present

## 2017-09-15 DIAGNOSIS — Z87891 Personal history of nicotine dependence: Secondary | ICD-10-CM | POA: Diagnosis not present

## 2017-09-15 DIAGNOSIS — D696 Thrombocytopenia, unspecified: Secondary | ICD-10-CM

## 2017-09-15 DIAGNOSIS — R05 Cough: Secondary | ICD-10-CM | POA: Diagnosis not present

## 2017-09-15 LAB — URINALYSIS, ROUTINE W REFLEX MICROSCOPIC
Bilirubin Urine: NEGATIVE
Glucose, UA: NEGATIVE mg/dL
Hgb urine dipstick: NEGATIVE
Ketones, ur: NEGATIVE mg/dL
Nitrite: NEGATIVE
Protein, ur: NEGATIVE mg/dL
Specific Gravity, Urine: 1.01 (ref 1.005–1.030)
pH: 7 (ref 5.0–8.0)

## 2017-09-15 LAB — BASIC METABOLIC PANEL
Anion gap: 11 (ref 5–15)
BUN: 45 mg/dL — ABNORMAL HIGH (ref 6–20)
CO2: 29 mmol/L (ref 22–32)
Calcium: 8.7 mg/dL — ABNORMAL LOW (ref 8.9–10.3)
Chloride: 99 mmol/L — ABNORMAL LOW (ref 101–111)
Creatinine, Ser: 2.42 mg/dL — ABNORMAL HIGH (ref 0.44–1.00)
GFR calc Af Amer: 21 mL/min — ABNORMAL LOW (ref >60)
GFR calc non Af Amer: 18 mL/min — ABNORMAL LOW (ref >60)
Glucose, Bld: 201 mg/dL — ABNORMAL HIGH (ref 65–99)
Potassium: 4.2 mmol/L (ref 3.5–5.1)
Sodium: 139 mmol/L (ref 135–145)

## 2017-09-15 LAB — CBC
HCT: 33.3 % — ABNORMAL LOW (ref 36.0–46.0)
Hemoglobin: 10.7 g/dL — ABNORMAL LOW (ref 12.0–15.0)
MCH: 31.9 pg (ref 26.0–34.0)
MCHC: 32.1 g/dL (ref 30.0–36.0)
MCV: 99.4 fL (ref 78.0–100.0)
Platelets: 99 10*3/uL — ABNORMAL LOW (ref 150–400)
RBC: 3.35 MIL/uL — ABNORMAL LOW (ref 3.87–5.11)
RDW: 17.6 % — ABNORMAL HIGH (ref 11.5–15.5)
WBC: 5.3 10*3/uL (ref 4.0–10.5)

## 2017-09-15 LAB — CBC WITH DIFFERENTIAL (CANCER CENTER ONLY)
Abs Granulocyte: 4.2 10*3/uL (ref 1.5–6.5)
Basophils Absolute: 0 10*3/uL (ref 0.0–0.1)
Basophils Relative: 0 %
Eosinophils Absolute: 0.2 10*3/uL (ref 0.0–0.5)
Eosinophils Relative: 4 %
HCT: 34.7 % — ABNORMAL LOW (ref 34.8–46.6)
Hemoglobin: 11.3 g/dL — ABNORMAL LOW (ref 11.6–15.9)
Lymphocytes Relative: 24 %
Lymphs Abs: 1.5 10*3/uL (ref 0.9–3.3)
MCH: 32.8 pg (ref 26.0–34.0)
MCHC: 32.6 g/dL (ref 32.0–36.0)
MCV: 100.9 fL (ref 81.0–101.0)
Monocytes Absolute: 0.5 10*3/uL (ref 0.1–0.9)
Monocytes Relative: 7 %
Neutro Abs: 4.2 10*3/uL (ref 1.5–6.5)
Neutrophils Relative %: 65 %
Platelet Count: 103 10*3/uL — ABNORMAL LOW (ref 145–400)
RBC: 3.44 MIL/uL — ABNORMAL LOW (ref 3.70–5.32)
RDW: 17.5 % — ABNORMAL HIGH (ref 11.1–15.7)
WBC Count: 6.5 10*3/uL (ref 4.0–10.3)

## 2017-09-15 LAB — URINALYSIS, MICROSCOPIC (REFLEX)

## 2017-09-15 MED ORDER — SODIUM CHLORIDE 0.9 % IV BOLUS (SEPSIS)
1000.0000 mL | Freq: Once | INTRAVENOUS | Status: AC
Start: 2017-09-15 — End: 2017-09-15
  Administered 2017-09-15: 1000 mL via INTRAVENOUS

## 2017-09-15 MED ORDER — ALBUTEROL SULFATE HFA 108 (90 BASE) MCG/ACT IN AERS: 1.0000 | INHALATION_SPRAY | Freq: Four times a day (QID) | RESPIRATORY_TRACT | 0 refills | Status: DC | PRN

## 2017-09-15 MED ORDER — AZITHROMYCIN 250 MG PO TABS: 250.0000 mg | ORAL_TABLET | Freq: Every day | ORAL | 0 refills | Status: DC

## 2017-09-15 MED ORDER — ROMIPLOSTIM 250 MCG ~~LOC~~ SOLR: 1.0000 ug/kg | Freq: Once | SUBCUTANEOUS | Status: DC

## 2017-09-15 MED ORDER — ALBUTEROL SULFATE (2.5 MG/3ML) 0.083% IN NEBU
2.5000 mg | INHALATION_SOLUTION | Freq: Once | RESPIRATORY_TRACT | Status: AC
  Administered 2017-09-15: 2.5 mg via RESPIRATORY_TRACT
  Filled 2017-09-15: qty 3

## 2017-09-15 NOTE — ED Notes (Signed)
Pt ambulatory using her walker with no difficulties; pts o2 sat 90% while laying and increased to 92-93 while up walking; pt denies SOB or any other concerns

## 2017-09-15 NOTE — ED Notes (Signed)
Pt verbalizes understanding of d/c instructions and denies any further needs at this time. 

## 2017-09-15 NOTE — ED Notes (Signed)
Patient ambulated to RR, SpO2 84%, + DOE placed on 2 l/m O2. SpO2 increased to 100% quickly. HR 52, RR 18

## 2017-09-15 NOTE — ED Provider Notes (Signed)
[place] EMERGENCY DEPARTMENT Provider Note   CSN: [id number] Arrival date & time: 09/15/17  1310     History   Chief Complaint Chief Complaint  Patient presents with  . Dizziness    HPI [name] is a 83 y.o. female.  HPI  83 year old female with a history of CHF, type 2 diabetes and prior history of PE presents with cough.  She has a history of thrombocytopenia and was due to get a dose of platelets today as typical.  She was found to be dizzy when getting up to walk to go get this infusion.  She states she felt off balance when it was happening.  She has had a cough for about 3 weeks and has seen Dr. Marin Olp for this.  She has not had any fevers, chest pain, or rhinorrhea.  She noticed some shortness of breath today only when walking to and from the bathroom.  While ambulating, she was noted to have O2 sats of 84% on room air.  She does not typically wear oxygen.  The cough occasionally has some tan sputum.  Past Medical History:  Diagnosis Date  . Allergic rhinitis   . Anxiety state, unspecified    panic attacks  . CHF (congestive heart failure) ([place])   . Depressive disorder, not elsewhere classified   . Extrinsic asthma, unspecified    no problem since adulthood  . Obesity   . OSA on CPAP    severe  . Pure hypercholesterolemia   . Respiratory failure with hypoxia ([place]) 09/2008   acute, secondary to multiple bilateral pulmonary embolism , negative hypercoagulable workup 09/2008 hospital stay  . Scoliosis   . Syncope and collapse   . Type II or unspecified type diabetes mellitus without mention of complication, not stated as uncontrolled   . Unspecified essential hypertension   . Unspecified hypothyroidism    hypo    Patient Active Problem List   Diagnosis Date Noted  . IDA (iron deficiency anemia) 05/18/2017  . Thrombocytopenia ([place]) 01/25/2017  . Chronic kidney disease (CKD) stage G4/A1, severely decreased glomerular filtration rate (GFR) between  15-29 mL/min/1.73 square meter and albuminuria creatinine ratio less than 30 mg/g ([facility]) 09/04/2015  . Chronic kidney disease (CKD), stage IV (severe) ([place]) 09/04/2015  . Hypoxia 08/24/2015  . Bronchitis 08/24/2015  . Acute respiratory failure with hypoxia ([place]) 08/24/2015  . Type 2 diabetes mellitus with stage 4 chronic kidney disease, with long-term current use of insulin ([place]) 08/05/2010  . GOUT 06/23/2010  . DERMATITIS 01/16/2010  . BACK PAIN 06/13/2009  . MYALGIA 06/13/2009  . Anxiety state 12/13/2008  . LEG EDEMA, CHRONIC 11/08/2008  . Sleep apnea 10/26/2008  . Hypothyroidism 09/27/2008  . HYPERCHOLESTEROLEMIA 09/27/2008  . OBESITY 09/27/2008  . DEPRESSION 09/27/2008  . Essential hypertension 09/27/2008  . ALLERGIC RHINITIS 09/27/2008  . ASTHMA, CHILDHOOD 09/27/2008    Past Surgical History:  Procedure Laterality Date  . EYE SURGERY    . JOINT REPLACEMENT    . knee replaced    . THYROID SURGERY      OB History    No data available       Home Medications    Prior to Admission medications   Medication Sig Start Date End Date Taking? Authorizing Provider  albuterol (PROVENTIL HFA;VENTOLIN HFA) 108 (90 Base) MCG/ACT inhaler Inhale 1-2 puffs into the lungs every 6 (six) hours as needed for wheezing or shortness of breath. 09/15/17   Sherwood Gambler, MD  atorvastatin (LIPITOR) 40 MG  tablet Take 40 mg by mouth daily.    [provider]  azithromycin (ZITHROMAX) 250 MG tablet Take 1 tablet (250 mg total) by mouth daily. Take first 2 tablets together, then 1 every day until finished. 09/15/17   Sherwood Gambler, MD  BIOTIN PO Take 1 tablet by mouth daily.    [provider]  Calcium Carbonate-Vit D-Min (CALCIUM 1200 PO) Take 1 tablet by mouth daily.    [provider]  Cholecalciferol (VITAMIN D3) 2000 units TABS Take 2,000 Units by mouth daily.    [provider]  Coenzyme Q10 200 MG capsule Take 200 mg by mouth daily.      [provider]  docusate sodium (COLACE) 100 MG capsule Take 1 capsule by mouth daily. 04/30/15   [provider]  fenofibrate 54 MG tablet TAKE 1 TABLET(54 MG) BY MOUTH DAILY 08/04/17   Nafziger, Tommi Rumps, NP  fexofenadine (ALLEGRA) 180 MG tablet Take 180 mg by mouth daily.      [provider]  furosemide (LASIX) 20 MG tablet Take 1 tablet (20 mg total) by mouth daily as needed. 06/08/17   Jerline Pain, MD  glipiZIDE (GLUCOTROL) 10 MG tablet Take 10 mg by mouth 2 (two) times daily before a meal.    [provider]  insulin NPH-regular Human (HUMULIN 70/30) (70-30) 100 UNIT/ML injection Inject 70 units under the skin in the morning, 15 units at noon and 75 units every evening. 09/14/17   Philemon Kingdom, MD  Insulin Pen Needle (B-D UF III MINI PEN NEEDLES) 31G X 5 MM MISC Use daily for insulin injection 12/15/16   Nafziger, Tommi Rumps, NP  Insulin Syringe-Needle U-100 (INSULIN SYRINGE 1CC/30GX5/16") 30G X 5/16" 1 ML MISC USE BID UTD 01/27/17   [provider]  levothyroxine (SYNTHROID, LEVOTHROID) 112 MCG tablet Take 1 tablet (112 mcg total) by mouth daily. 12/15/16   Nafziger, Tommi Rumps, NP  Multiple Vitamin (MULTIVITAMIN) tablet Take 1 tablet by mouth daily.      [provider]  potassium chloride SA (K-DUR,KLOR-CON) 20 MEQ tablet Take 20 mEq by mouth 2 (two) times daily.    [provider]  Probiotic Product (PROBIOTIC PO) Take 1 capsule by mouth daily.    [provider]  ranitidine (ZANTAC) 150 MG tablet Take 150 mg by mouth daily as needed for heartburn.    [provider]  sertraline (ZOLOFT) 50 MG tablet Take 1 tablet (50 mg total) daily by mouth. 07/26/17   Nafziger, Tommi Rumps, NP  vitamin C (ASCORBIC ACID) 250 MG tablet Take 250 mg by mouth daily.    [provider]    Family History Family History  Problem Relation Age of Onset  . Allergies Mother   . Clotting disorder Mother   . Osteoarthritis Mother   . Asthma Mother     . Arthritis Other   . Diabetes Other   . Hyperlipidemia Other   . Hypertension Other   . Coronary artery disease Other   . Stroke Other   . Osteoarthritis Daughter   . Rheum arthritis Maternal Grandmother   . Clotting disorder Maternal Grandmother   . Clotting disorder Maternal Uncle   . Clotting disorder Daughter   . Allergies Daughter   . Breast cancer Neg Hx     Social History Social History   Tobacco Use  . Smoking status: Former Smoker    Years: 20.00    Last attempt to quit: 09/07/1989    Years since quitting: 28.0  .  Smokeless tobacco: Never Used  Substance Use Topics  . Alcohol use: No  . Drug use: No     Allergies   Apixaban; Aspirin; Mirabegron; Metformin; Pineapple; Tetracycline; Fluticasone-salmeterol; Iodinated diagnostic agents; Lactose intolerance (gi); Latex; Lisinopril; Metronidazole; Sulfa antibiotics; and Sulfonamide derivatives   Review of Systems Review of Systems  Constitutional: Negative for fever.  Respiratory: Positive for cough and shortness of breath.   Cardiovascular: Positive for leg swelling (trace, ankles). Negative for chest pain.  Gastrointestinal: Negative for vomiting.  Neurological: Positive for dizziness. Negative for syncope, weakness, numbness and headaches.  All other systems reviewed and are negative.    Physical Exam Updated Vital Signs BP (!) 104/51   Pulse 69   Temp 98.9 F (37.2 C) (Oral)   Resp (!) 21   Ht 5\' 1"  (1.549 m)   Wt 112.5 kg (248 lb)   LMP  (LMP Unknown)   SpO2 92% Comment: pt ambulating   BMI 46.86 kg/m   Physical Exam  Constitutional: She is oriented to person, place, and time. She appears well-developed and well-nourished. No distress.  HENT:  Head: Normocephalic and atraumatic.  Right Ear: External ear normal.  Left Ear: External ear normal.  Nose: Nose normal.  Eyes: EOM are normal. Pupils are equal, round, and reactive to light. Right eye exhibits no discharge. Left eye exhibits no  discharge.  Neck: Neck supple.  Cardiovascular: Regular rhythm and normal heart sounds. Bradycardia present.  Pulmonary/Chest: Effort normal. She has wheezes (slight, expiratory, diffuse).  Abdominal: Soft. There is no tenderness.  Neurological: She is alert and oriented to person, place, and time.  CN 3-12 grossly intact. 5/5 strength in all 4 extremities. Grossly normal sensation. Normal finger to nose.   Skin: Skin is warm and dry. She is not diaphoretic.  Nursing note and vitals reviewed.    ED Treatments / Results  Labs (all labs ordered are listed, but only abnormal results are displayed) Labs Reviewed  BASIC METABOLIC PANEL - Abnormal; Notable for the following components:      Result Value   Chloride 99 (*)    Glucose, Bld 201 (*)    BUN 45 (*)    Creatinine, Ser 2.42 (*)    Calcium 8.7 (*)    GFR calc non Af Amer 18 (*)    GFR calc Af Amer 21 (*)    All other components within normal limits  CBC - Abnormal; Notable for the following components:   RBC 3.35 (*)    Hemoglobin 10.7 (*)    HCT 33.3 (*)    RDW 17.6 (*)    Platelets 99 (*)    All other components within normal limits  URINALYSIS, ROUTINE W REFLEX MICROSCOPIC - Abnormal; Notable for the following components:   APPearance HAZY (*)    Leukocytes, UA LARGE (*)    All other components within normal limits  URINALYSIS, MICROSCOPIC (REFLEX) - Abnormal; Notable for the following components:   Bacteria, UA MANY (*)    Squamous Epithelial / LPF 0-5 (*)    All other components within normal limits    EKG  EKG Interpretation  Date/Time:  Wednesday September 15 2017 13:46:09 EST Ventricular Rate:  49 PR Interval:    QRS Duration: 136 QT Interval:  588 QTC Calculation: 537 R Axis:   42 Text Interpretation:  Sinus or ectopic atrial bradycardia Nonspecific intraventricular conduction delay Consider anterior infarct Abnormal T, consider ischemia, lateral leads Since last tracing rate slower Confirmed by  [place], Sam [phone])  on 09/15/2017 1:50:52 PM       Radiology Dg Chest 2 View  Result Date: 09/15/2017 CLINICAL DATA:  Cough for 3 weeks EXAM: CHEST  2 VIEW COMPARISON:  February 11, 2017 FINDINGS: There is a questionable nodular opacity in the right lower lobe on the frontal view measuring 2.8 x 2.2 cm. Elsewhere lungs are clear. Heart is upper normal in size with pulmonary vascularity within normal limits. There is aortic atherosclerosis. No adenopathy. No bone lesions. IMPRESSION: Questionable nodular opacity right lower lobe on frontal view measuring 2.8 x 2.2 cm. This finding is felt to warrant noncontrast enhanced chest CT to further assess. No edema or consolidation. Stable cardiac silhouette. There is aortic atherosclerosis. Aortic Atherosclerosis (ICD10-I70.0). Electronically Signed   By: Lowella Grip III M.D.   On: 09/15/2017 13:42   Ct Chest Wo Contrast  Result Date: 09/15/2017 CLINICAL DATA:  Upper respiratory symptoms for 3 weeks. Persistent cough. Hypertension. Diabetes. CHF. Possible right-sided pulmonary nodule on plain film. EXAM: CT CHEST WITHOUT CONTRAST TECHNIQUE: Multidetector CT imaging of the chest was performed following the standard protocol without IV contrast. COMPARISON:  Chest radiograph of earlier today. Chest CT 08/24/2015. FINDINGS: Cardiovascular: Aortic and branch vessel atherosclerosis. Mild cardiomegaly with lipomatous hypertrophy of the interatrial septum. No pericardial effusion. Pulmonary artery enlargement, outflow tract 3.3 cm. Mediastinum/Nodes: Similar right high periesophageal soft tissue nodule, including at 2.1 cm on image 13/series 2. No mediastinal or definite hilar adenopathy, given limitations of unenhanced CT. Lungs/Pleura: No pleural fluid. Bilateral pulmonary nodules. A right apical 4 mm nodule on image 35/series 3 is is similar. A 9 mm right middle lobe pulmonary nodule, including image 94/series 3, is present on the prior exam, including on image  57/series 11 (08/24/2015 CT). Felt to be similar. Four mm right lower lobe pulmonary nodule on image 95/series 3 is not readily apparent on the prior but in the region of atelectasis on that study. Subsegmental atelectasis dependently in both lower lobes. Mild centrilobular emphysema. No correlate for the plain film abnormality of earlier today. Upper Abdomen: Normal imaged portions of the liver, spleen, stomach, pancreas, left adrenal gland. Mild right adrenal nodularity is similar. Small stones within the gallbladder neck are incompletely imaged. Abdominal aortic atherosclerosis. Musculoskeletal: Thoracolumbar spondylosis with S-shaped thoracic spine curvature. IMPRESSION: 1.  No acute process in the chest. 2. No correlate for the plain film abnormality. No dominant right lung mass. 3. Bilateral pulmonary nodules are primarily similar. A right lower lobe 4 mm nodule is not apparent on the prior but may have been obscured by atelectasis. Non-contrast chest CT can be considered in 12 months, given risk factors for primary bronchogenic carcinoma. This recommendation follows the consensus statement: Guidelines for Management of Incidental Pulmonary Nodules Detected on CT Images: From the Fleischner Society 2017; Radiology 2017; 284:228-243. 4.  Emphysema (ICD10-J43.9). 5. Pulmonary artery enlargement suggests pulmonary arterial hypertension. 6.  Aortic Atherosclerosis (ICD10-I70.0). 7. Cholelithiasis. Electronically Signed   By: Abigail Miyamoto M.D.   On: 09/15/2017 15:47    Procedures Procedures (including critical care time)  Medications Ordered in ED Medications  sodium chloride 0.9 % bolus 1,000 mL (0 mLs Intravenous Stopped 09/15/17 1701)  albuterol (PROVENTIL) (2.5 MG/3ML) 0.083% nebulizer solution 2.5 mg (2.5 mg Nebulization Given 09/15/17 1543)     Initial Impression / Assessment and Plan / ED Course  I have reviewed the triage vital signs and the nursing notes.  Pertinent labs & imaging results that  were available during my care of the patient  were reviewed by me and considered in my medical decision making (see chart for details).     I think the patient's dizziness was related to mild hypoxia.  She has been ambulating to the bathroom without difficulty.  She was hypoxic one time when doing so but after an albuterol treatment she was able to ambulate without dyspnea, dizziness, or hypoxia.  She has mild bump in her creatinine from her chronic kidney disease.  While her most recent creatinine was 1.9 she has had several in the recent past that are in the 2.4 range which is where she is today.  I offered admission but she declines.  I think she is well enough for discharge.  Given the prolonged cough and shortness of breath, will treat as atypical pneumonia with azithromycin.  CT does not show any concerning new findings and it is unclear what the chest x-ray finding was.  She will be discharged home but advised of strict return precautions and close follow-up with PCP.  Final Clinical Impressions(s) / ED Diagnoses   Final diagnoses:  Acute bronchitis, unspecified organism  Chronic kidney disease, unspecified CKD stage    ED Discharge Orders        Ordered    azithromycin (ZITHROMAX) 250 MG tablet  Daily     09/15/17 1746    albuterol (PROVENTIL HFA;VENTOLIN HFA) 108 (90 Base) MCG/ACT inhaler  Every 6 hours PRN     09/15/17 1746       Sherwood Gambler, MD 09/16/17 [phone]

## 2017-09-15 NOTE — ED Triage Notes (Signed)
Pt sent here from PMD office for eval ? Pneumonia , URI symptoms x 3 weeks today c/o dizziness

## 2017-09-21 ENCOUNTER — Telehealth: Payer: Self-pay | Admitting: Adult Health

## 2017-09-21 NOTE — Telephone Encounter (Signed)
Please advise 

## 2017-09-21 NOTE — Telephone Encounter (Signed)
I am ok with her switching.   She can either follow up with me or wait for Dr. Lorelei Pont to see her, which ever she prefers

## 2017-09-21 NOTE — Telephone Encounter (Signed)
If she wants to change to me that is ok,  However I am a couple of months out I think for a new patient appt, so I would have her follow-up with Tommi Rumps for the time being Thank you! [place]

## 2017-09-21 NOTE — Telephone Encounter (Signed)
Spoke with pt and let her know that she needed to follow ED with her current PCP and scheduled her an appointment with Dr. Lorelei Pont 01/10/18 at 1pm.

## 2017-09-21 NOTE — Telephone Encounter (Signed)
Copied from [place]. Topic: Quick Communication - See Telephone Encounter >> Sep 21, 2017  1:01 PM Antonieta Iba C wrote: CRM for notification. See Telephone encounter for: pt would like to switch providers. pt's daughter called in on pt's behalf. Pt would like to switch providers from [facility] to [facility].     ALSO, pt was seen at the ED for bronchitis and was advised to follow up with PCP. Daughter would like to be advised on which provider follow up should be with?    CB: [phone]   09/21/17.

## 2017-09-21 NOTE — Telephone Encounter (Signed)
Per Dr. Lorelei Pont okay to switch to her- however needs ED follow-up with current PCP as it may take several weeks to get in with her.

## 2017-09-22 ENCOUNTER — Inpatient Hospital Stay: Payer: Medicare Other

## 2017-09-22 VITALS — BP 181/44 | HR 61 | Temp 98.8°F | Resp 20

## 2017-09-22 DIAGNOSIS — D696 Thrombocytopenia, unspecified: Secondary | ICD-10-CM

## 2017-09-22 DIAGNOSIS — D693 Immune thrombocytopenic purpura: Secondary | ICD-10-CM | POA: Diagnosis not present

## 2017-09-22 LAB — CBC WITH DIFFERENTIAL (CANCER CENTER ONLY)
Basophils Absolute: 0 10*3/uL (ref 0.0–0.1)
Basophils Relative: 1 %
Eosinophils Absolute: 0.2 10*3/uL (ref 0.0–0.5)
Eosinophils Relative: 4 %
HCT: 33.4 % — ABNORMAL LOW (ref 34.8–46.6)
Hemoglobin: 10.8 g/dL — ABNORMAL LOW (ref 11.6–15.9)
Lymphocytes Relative: 21 %
Lymphs Abs: 1.2 10*3/uL (ref 0.9–3.3)
MCH: 32.7 pg (ref 26.0–34.0)
MCHC: 32.3 g/dL (ref 32.0–36.0)
MCV: 101.2 fL — ABNORMAL HIGH (ref 81.0–101.0)
Monocytes Absolute: 0.4 10*3/uL (ref 0.1–0.9)
Monocytes Relative: 7 %
Neutro Abs: 4 10*3/uL (ref 1.5–6.5)
Neutrophils Relative %: 67 %
Platelet Count: 53 10*3/uL — ABNORMAL LOW (ref 145–400)
RBC: 3.3 MIL/uL — ABNORMAL LOW (ref 3.70–5.32)
RDW: 17.7 % — ABNORMAL HIGH (ref 11.1–15.7)
WBC Count: 5.9 10*3/uL (ref 3.9–10.3)

## 2017-09-22 MED ORDER — ROMIPLOSTIM INJECTION 500 MCG
450.0000 ug | Freq: Once | SUBCUTANEOUS | Status: AC
  Administered 2017-09-22: 450 ug via SUBCUTANEOUS
  Filled 2017-09-22: qty 0.9

## 2017-09-22 NOTE — Patient Instructions (Signed)
Romiplostim injection (NPLATE) What is this medicine? ROMIPLOSTIM (roe mi PLOE stim) helps your body make more platelets. This medicine is used to treat low platelets caused by chronic idiopathic thrombocytopenic purpura (ITP). This medicine may be used for other purposes; ask your health care provider or pharmacist if you have questions. COMMON BRAND NAME(S): Nplate What should I tell my health care provider before I take this medicine? They need to know if you have any of these conditions: -cancer or myelodysplastic syndrome -low blood counts, like low white cell, platelet, or red cell counts -take medicines that treat or prevent blood clots -an unusual or allergic reaction to romiplostim, mannitol, other medicines, foods, dyes, or preservatives -pregnant or trying to get pregnant -breast-feeding How should I use this medicine? This medicine is for injection under the skin. It is given by a health care professional in a hospital or clinic setting. A special MedGuide will be given to you before your injection. Read this information carefully each time. Talk to your pediatrician regarding the use of this medicine in children. Special care may be needed. Overdosage: If you think you have taken too much of this medicine contact a poison control center or emergency room at once. NOTE: This medicine is only for you. Do not share this medicine with others. What if I miss a dose? It is important not to miss your dose. Call your doctor or health care professional if you are unable to keep an appointment. What may interact with this medicine? Interactions are not expected. This list may not describe all possible interactions. Give your health care provider a list of all the medicines, herbs, non-prescription drugs, or dietary supplements you use. Also tell them if you smoke, drink alcohol, or use illegal drugs. Some items may interact with your medicine. What should I watch for while using this  medicine? Your condition will be monitored carefully while you are receiving this medicine. Visit your prescriber or health care professional for regular checks on your progress and for the needed blood tests. It is important to keep all appointments. What side effects may I notice from receiving this medicine? Side effects that you should report to your doctor or health care professional as soon as possible: -allergic reactions like skin rash, itching or hives, swelling of the face, lips, or tongue -shortness of breath, chest pain, swelling in a leg -unusual bleeding or bruising Side effects that usually do not require medical attention (report to your doctor or health care professional if they continue or are bothersome): -dizziness -headache -muscle aches -pain in arms and legs -stomach pain -trouble sleeping This list may not describe all possible side effects. Call your doctor for medical advice about side effects. You may report side effects to FDA at 1-800-FDA-1088. Where should I keep my medicine? This drug is given in a hospital or clinic and will not be stored at home. NOTE: This sheet is a summary. It may not cover all possible information. If you have questions about this medicine, talk to your doctor, pharmacist, or health care provider.  2018 Elsevier/Gold Standard (2008-04-23 15:13:04)  

## 2017-09-28 DIAGNOSIS — N200 Calculus of kidney: Secondary | ICD-10-CM | POA: Diagnosis not present

## 2017-09-28 DIAGNOSIS — I129 Hypertensive chronic kidney disease with stage 1 through stage 4 chronic kidney disease, or unspecified chronic kidney disease: Secondary | ICD-10-CM | POA: Diagnosis not present

## 2017-09-28 DIAGNOSIS — N183 Chronic kidney disease, stage 3 (moderate): Secondary | ICD-10-CM | POA: Diagnosis not present

## 2017-09-29 ENCOUNTER — Inpatient Hospital Stay: Payer: Medicare Other

## 2017-09-29 ENCOUNTER — Other Ambulatory Visit: Payer: Self-pay

## 2017-09-29 VITALS — BP 158/49 | HR 75 | Temp 97.6°F | Resp 20 | Wt 250.1 lb

## 2017-09-29 DIAGNOSIS — D696 Thrombocytopenia, unspecified: Secondary | ICD-10-CM

## 2017-09-29 DIAGNOSIS — D693 Immune thrombocytopenic purpura: Secondary | ICD-10-CM | POA: Diagnosis not present

## 2017-09-29 LAB — CBC WITH DIFFERENTIAL (CANCER CENTER ONLY)
Basophils Absolute: 0 10*3/uL (ref 0.0–0.1)
Basophils Relative: 0 %
Eosinophils Absolute: 0.3 10*3/uL (ref 0.0–0.5)
Eosinophils Relative: 4 %
HCT: 32.8 % — ABNORMAL LOW (ref 34.8–46.6)
Hemoglobin: 10.9 g/dL — ABNORMAL LOW (ref 11.6–15.9)
Lymphocytes Relative: 23 %
Lymphs Abs: 1.4 10*3/uL (ref 0.9–3.3)
MCH: 33.1 pg (ref 26.0–34.0)
MCHC: 33.2 g/dL (ref 32.0–36.0)
MCV: 99.7 fL (ref 81.0–101.0)
Monocytes Absolute: 0.5 10*3/uL (ref 0.1–0.9)
Monocytes Relative: 8 %
Neutro Abs: 3.7 10*3/uL (ref 1.5–6.5)
Neutrophils Relative %: 65 %
Platelet Count: 105 10*3/uL — ABNORMAL LOW (ref 145–400)
RBC: 3.29 MIL/uL — ABNORMAL LOW (ref 3.70–5.32)
RDW: 18 % — ABNORMAL HIGH (ref 11.1–15.7)
WBC Count: 5.8 10*3/uL (ref 3.9–10.3)

## 2017-09-29 MED ORDER — ROMIPLOSTIM INJECTION 500 MCG
450.0000 ug | Freq: Once | SUBCUTANEOUS | Status: AC
  Administered 2017-09-29: 450 ug via SUBCUTANEOUS
  Filled 2017-09-29: qty 0.9

## 2017-09-29 NOTE — Patient Instructions (Signed)
Romiplostim injection What is this medicine? ROMIPLOSTIM (roe mi PLOE stim) helps your body make more platelets. This medicine is used to treat low platelets caused by chronic idiopathic thrombocytopenic purpura (ITP). This medicine may be used for other purposes; ask your health care provider or pharmacist if you have questions. COMMON BRAND NAME(S): Nplate What should I tell my health care provider before I take this medicine? They need to know if you have any of these conditions: -cancer or myelodysplastic syndrome -low blood counts, like low white cell, platelet, or red cell counts -take medicines that treat or prevent blood clots -an unusual or allergic reaction to romiplostim, mannitol, other medicines, foods, dyes, or preservatives -pregnant or trying to get pregnant -breast-feeding How should I use this medicine? This medicine is for injection under the skin. It is given by a health care professional in a hospital or clinic setting. A special MedGuide will be given to you before your injection. Read this information carefully each time. Talk to your pediatrician regarding the use of this medicine in children. Special care may be needed. Overdosage: If you think you have taken too much of this medicine contact a poison control center or emergency room at once. NOTE: This medicine is only for you. Do not share this medicine with others. What if I miss a dose? It is important not to miss your dose. Call your doctor or health care professional if you are unable to keep an appointment. What may interact with this medicine? Interactions are not expected. This list may not describe all possible interactions. Give your health care provider a list of all the medicines, herbs, non-prescription drugs, or dietary supplements you use. Also tell them if you smoke, drink alcohol, or use illegal drugs. Some items may interact with your medicine. What should I watch for while using this  medicine? Your condition will be monitored carefully while you are receiving this medicine. Visit your prescriber or health care professional for regular checks on your progress and for the needed blood tests. It is important to keep all appointments. What side effects may I notice from receiving this medicine? Side effects that you should report to your doctor or health care professional as soon as possible: -allergic reactions like skin rash, itching or hives, swelling of the face, lips, or tongue -shortness of breath, chest pain, swelling in a leg -unusual bleeding or bruising Side effects that usually do not require medical attention (report to your doctor or health care professional if they continue or are bothersome): -dizziness -headache -muscle aches -pain in arms and legs -stomach pain -trouble sleeping This list may not describe all possible side effects. Call your doctor for medical advice about side effects. You may report side effects to FDA at 1-800-FDA-1088. Where should I keep my medicine? This drug is given in a hospital or clinic and will not be stored at home. NOTE: This sheet is a summary. It may not cover all possible information. If you have questions about this medicine, talk to your doctor, pharmacist, or health care provider.  2018 Elsevier/Gold Standard (2008-04-23 15:13:04)  

## 2017-10-06 ENCOUNTER — Ambulatory Visit: Payer: Medicare Other | Admitting: Family

## 2017-10-06 ENCOUNTER — Other Ambulatory Visit: Payer: Medicare Other

## 2017-10-06 ENCOUNTER — Ambulatory Visit: Payer: Medicare Other

## 2017-10-08 ENCOUNTER — Inpatient Hospital Stay (HOSPITAL_BASED_OUTPATIENT_CLINIC_OR_DEPARTMENT_OTHER): Payer: Medicare Other | Admitting: Family

## 2017-10-08 ENCOUNTER — Other Ambulatory Visit: Payer: Self-pay

## 2017-10-08 ENCOUNTER — Inpatient Hospital Stay: Payer: Medicare Other | Attending: Hematology & Oncology

## 2017-10-08 ENCOUNTER — Encounter: Payer: Self-pay | Admitting: Family

## 2017-10-08 ENCOUNTER — Inpatient Hospital Stay: Payer: Medicare Other

## 2017-10-08 VITALS — BP 141/52 | HR 66 | Temp 98.0°F | Wt 249.1 lb

## 2017-10-08 DIAGNOSIS — D696 Thrombocytopenia, unspecified: Secondary | ICD-10-CM

## 2017-10-08 DIAGNOSIS — D509 Iron deficiency anemia, unspecified: Secondary | ICD-10-CM | POA: Diagnosis not present

## 2017-10-08 DIAGNOSIS — D693 Immune thrombocytopenic purpura: Secondary | ICD-10-CM

## 2017-10-08 LAB — CMP (CANCER CENTER ONLY)
ALT: 19 U/L (ref 0–55)
AST: 25 U/L (ref 5–34)
Albumin: 4 g/dL (ref 3.5–5.0)
Alkaline Phosphatase: 42 U/L (ref 40–150)
Anion gap: 14 — ABNORMAL HIGH (ref 3–11)
BUN: 38 mg/dL — ABNORMAL HIGH (ref 7–26)
CO2: 30 mmol/L — ABNORMAL HIGH (ref 22–29)
Calcium: 9.5 mg/dL (ref 8.4–10.4)
Chloride: 98 mmol/L (ref 98–109)
Creatinine: 2.5 mg/dL — ABNORMAL HIGH (ref 0.60–1.10)
GFR, Est AFR Am: 20 mL/min — ABNORMAL LOW (ref >60)
GFR, Estimated: 17 mL/min — ABNORMAL LOW (ref >60)
Glucose, Bld: 326 mg/dL — ABNORMAL HIGH (ref 70–140)
Potassium: 4.8 mmol/L (ref 3.5–5.1)
Sodium: 142 mmol/L (ref 136–145)
Total Bilirubin: 0.9 mg/dL (ref 0.2–1.2)
Total Protein: 7.7 g/dL (ref 6.4–8.3)

## 2017-10-08 LAB — CBC WITH DIFFERENTIAL (CANCER CENTER ONLY)
Basophils Absolute: 0 10*3/uL (ref 0.0–0.1)
Basophils Relative: 0 %
Eosinophils Absolute: 0.3 10*3/uL (ref 0.0–0.5)
Eosinophils Relative: 6 %
HCT: 32.5 % — ABNORMAL LOW (ref 34.8–46.6)
Hemoglobin: 10.8 g/dL — ABNORMAL LOW (ref 11.6–15.9)
Lymphocytes Relative: 24 %
Lymphs Abs: 1.3 10*3/uL (ref 0.9–3.3)
MCH: 33.1 pg (ref 26.0–34.0)
MCHC: 33.2 g/dL (ref 32.0–36.0)
MCV: 99.7 fL (ref 81.0–101.0)
Monocytes Absolute: 0.4 10*3/uL (ref 0.1–0.9)
Monocytes Relative: 8 %
Neutro Abs: 3.2 10*3/uL (ref 1.5–6.5)
Neutrophils Relative %: 62 %
Platelet Count: 115 10*3/uL — ABNORMAL LOW (ref 145–400)
RBC: 3.26 MIL/uL — ABNORMAL LOW (ref 3.70–5.32)
RDW: 17.7 % — ABNORMAL HIGH (ref 11.1–15.7)
WBC Count: 5.2 10*3/uL (ref 3.9–10.0)

## 2017-10-08 MED ORDER — ROMIPLOSTIM INJECTION 500 MCG
450.0000 ug | Freq: Once | SUBCUTANEOUS | Status: AC
  Administered 2017-10-08: 450 ug via SUBCUTANEOUS
  Filled 2017-10-08: qty 0.9

## 2017-10-08 NOTE — Progress Notes (Signed)
Hematology and Oncology Follow Up Visit  [name] [id number] Aug 03, 1935 83 y.o. 10/08/2017   Principle Diagnosis:  Immune based thrombocytopenia History of PE Iron deficiency anemia  Current Therapy:   Nplate as indicated IV Iron as indicated - dose given on 07/07/2017   Interim History:  [name] is here today with her daughter for follow-up. She is doing well and has no complaints at this time. Her platelet count is 115, Hgb 10.8 and WBC count 5.2.  She has had no bleeding. She bruises easily but not in excess.  No lymphadenopathy found on exam.  No fever, chills, n/v, cough, rash, dizziness, SOB, chest pain, palpitations, abdominal pain or changes in bowel or bladder habits.  No swelling, tenderness, numbness or tingling in her extremities. No c/o pain.  She uses her rolling walker when ambulating for support. No falls or syncopal episodes.  She has a good appetite and is staying well hydrated. Her weight is stable.   ECOG Performance Status: 1 - Symptomatic but completely ambulatory  Medications:  Allergies as of 10/08/2017      Reactions   Apixaban Other (See Comments)   Internal Bleeding Patient states internal bleeding.   Aspirin Itching, Rash, Hives, Swelling   Swelling of her tongue   Mirabegron Other (See Comments)   Patient experienced A-Fib   Metformin Diarrhea, Nausea Only   Pineapple Swelling   Throat swells and blisters on tongue and roof of mouth per patient   Tetracycline Hives   Fluticasone-salmeterol Itching, Rash   Iodinated Diagnostic Agents Itching, Rash      Lactose Intolerance (gi) Other (See Comments)   gas   Latex Itching, Rash, Other (See Comments)   Pt. States latex pulls her skin off.    Lisinopril Cough   Metronidazole Other (See Comments)   Unknown   Sulfa Antibiotics Rash   Sulfonamide Derivatives Itching, Rash      Medication List        Accurate as of 10/08/17 11:27 AM. Always use your most recent med list.            albuterol 108 (90 Base) MCG/ACT inhaler Commonly known as:  PROVENTIL HFA;VENTOLIN HFA Inhale 1-2 puffs into the lungs every 6 (six) hours as needed for wheezing or shortness of breath.   atorvastatin 40 MG tablet Commonly known as:  LIPITOR Take 40 mg by mouth daily.   azithromycin 250 MG tablet Commonly known as:  ZITHROMAX Take 1 tablet (250 mg total) by mouth daily. Take first 2 tablets together, then 1 every day until finished.   BIOTIN PO Take 1 tablet by mouth daily.   CALCIUM 1200 PO Take 1 tablet by mouth daily.   Coenzyme Q10 200 MG capsule Take 200 mg by mouth daily.   docusate sodium 100 MG capsule Commonly known as:  COLACE Take 1 capsule by mouth daily.   fenofibrate 54 MG tablet TAKE 1 TABLET(54 MG) BY MOUTH DAILY   fexofenadine 180 MG tablet Commonly known as:  ALLEGRA Take 180 mg by mouth daily.   furosemide 20 MG tablet Commonly known as:  LASIX Take 1 tablet (20 mg total) by mouth daily as needed.   glipiZIDE 10 MG tablet Commonly known as:  GLUCOTROL Take 10 mg by mouth 2 (two) times daily before a meal.   insulin NPH-regular Human (70-30) 100 UNIT/ML injection Commonly known as:  HUMULIN 70/30 Inject 70 units under the skin in the morning, 15 units at noon and 75 units every  evening.   Insulin Pen Needle 31G X 5 MM Misc Commonly known as:  B-D UF III MINI PEN NEEDLES Use daily for insulin injection   INSULIN SYRINGE 1CC/30GX5/16" 30G X 5/16" 1 ML Misc USE BID UTD   levothyroxine 112 MCG tablet Commonly known as:  SYNTHROID, LEVOTHROID Take 1 tablet (112 mcg total) by mouth daily.   multivitamin tablet Take 1 tablet by mouth daily.   potassium chloride SA 20 MEQ tablet Commonly known as:  K-DUR,KLOR-CON Take 20 mEq by mouth 2 (two) times daily.   PROBIOTIC PO Take 1 capsule by mouth daily.   ranitidine 150 MG tablet Commonly known as:  ZANTAC Take 150 mg by mouth daily as needed for heartburn.   sertraline 50 MG  tablet Commonly known as:  ZOLOFT Take 1 tablet (50 mg total) daily by mouth.   vitamin C 250 MG tablet Commonly known as:  ASCORBIC ACID Take 250 mg by mouth daily.   Vitamin D3 2000 units Tabs Take 2,000 Units by mouth daily.       Allergies:  Allergies  Allergen Reactions  . Apixaban Other (See Comments)    Internal Bleeding  Patient states internal bleeding.  . Aspirin Itching, Rash, Hives and Swelling    Swelling of her tongue  . Mirabegron Other (See Comments)    Patient experienced A-Fib  . Metformin Diarrhea and Nausea Only  . Pineapple Swelling    Throat swells and blisters on tongue and roof of mouth per patient  . Tetracycline Hives  . Fluticasone-Salmeterol Itching and Rash  . Iodinated Diagnostic Agents Itching and Rash       . Lactose Intolerance (Gi) Other (See Comments)    gas  . Latex Itching, Rash and Other (See Comments)    Pt. States latex pulls her skin off.   . Lisinopril Cough  . Metronidazole Other (See Comments)    Unknown   . Sulfa Antibiotics Rash  . Sulfonamide Derivatives Itching and Rash    Past Medical History, Surgical history, Social history, and Family History were reviewed and updated.  Review of Systems: All other 10 point review of systems is negative.   Physical Exam:  vitals were not taken for this visit.   Wt Readings from Last 3 Encounters:  09/29/17 250 lb 1.9 oz (113.5 kg)  09/15/17 248 lb (112.5 kg)  09/08/17 248 lb 6.4 oz (112.7 kg)    Ocular: Sclerae unicteric, pupils equal, round and reactive to light Ear-nose-throat: Oropharynx clear, dentition fair Lymphatic: No cervical, supraclavicular or axillary adenopathy Lungs no rales or rhonchi, good excursion bilaterally Heart regular rate and rhythm, no murmur appreciated Abd soft, nontender, positive bowel sounds, no liver or spleen tip palpated on exam, no fluid wave  MSK no focal spinal tenderness, no joint edema Neuro: non-focal, well-oriented, appropriate  affect Breasts: Deferred   Lab Results  Component Value Date   WBC 5.8 09/29/2017   HGB 10.7 (L) 09/15/2017   HCT 32.8 (L) 09/29/2017   MCV 99.7 09/29/2017   PLT 105 (L) 09/29/2017   Lab Results  Component Value Date   FERRITIN 222 07/28/2017   IRON 101 07/28/2017   TIBC 393 07/28/2017   UIBC 291 07/28/2017   IRONPCTSAT 26 07/28/2017   Lab Results  Component Value Date   RETICCTPCT 1.9 01/28/2011   RBC 3.29 (L) 09/29/2017   RETICCTABS 73.3 01/28/2011   No results found for: KPAFRELGTCHN, LAMBDASER, KAPLAMBRATIO No results found for: IGGSERUM, IGA, IGMSERUM No results found for:  Odetta Pink, SPEI   Chemistry      Component Value Date/Time   NA 139 09/15/2017 1401   NA 145 09/08/2017 1317   NA 142 02/25/2017 1013   K 4.2 09/15/2017 1401   K 4.5 09/08/2017 1317   K 3.8 02/25/2017 1013   CL 99 (L) 09/15/2017 1401   CL 98 09/08/2017 1317   CO2 29 09/15/2017 1401   CO2 32 09/08/2017 1317   CO2 26 02/25/2017 1013   BUN 45 (H) 09/15/2017 1401   BUN 34 (H) 09/08/2017 1317   BUN 20.1 02/25/2017 1013   CREATININE 2.42 (H) 09/15/2017 1401   CREATININE 1.9 (H) 09/08/2017 1317   CREATININE 1.8 (H) 02/25/2017 1013      Component Value Date/Time   CALCIUM 8.7 (L) 09/15/2017 1401   CALCIUM 9.3 09/08/2017 1317   CALCIUM 8.7 02/25/2017 1013   ALKPHOS 45 09/08/2017 1317   ALKPHOS 65 02/25/2017 1013   AST 31 09/08/2017 1317   AST 16 02/25/2017 1013   ALT 26 09/08/2017 1317   ALT 13 02/25/2017 1013   BILITOT 0.90 09/08/2017 1317   BILITOT 0.76 02/25/2017 1013      Impression and Plan: [name] is a very pleasant 83 yo caucasian female with immune based thrombocytopenia. She continues to do well on Nplate and her platelet count is now up to 115.  We will give her Nplate today as planned.  Will now check her labs every 2 weeks and give Nplate as indicated. We will see her back in another 6 weeks for follow-up.  She  will contact our office with any questions or concerns. We can certainly see her sooner if need be.   Laverna Peace, NP 2/1/201911:27 AM

## 2017-10-08 NOTE — Patient Instructions (Signed)
Romiplostim injection What is this medicine? ROMIPLOSTIM (roe mi PLOE stim) helps your body make more platelets. This medicine is used to treat low platelets caused by chronic idiopathic thrombocytopenic purpura (ITP). This medicine may be used for other purposes; ask your health care provider or pharmacist if you have questions. COMMON BRAND NAME(S): Nplate What should I tell my health care provider before I take this medicine? They need to know if you have any of these conditions: -cancer or myelodysplastic syndrome -low blood counts, like low white cell, platelet, or red cell counts -take medicines that treat or prevent blood clots -an unusual or allergic reaction to romiplostim, mannitol, other medicines, foods, dyes, or preservatives -pregnant or trying to get pregnant -breast-feeding How should I use this medicine? This medicine is for injection under the skin. It is given by a health care professional in a hospital or clinic setting. A special MedGuide will be given to you before your injection. Read this information carefully each time. Talk to your pediatrician regarding the use of this medicine in children. Special care may be needed. Overdosage: If you think you have taken too much of this medicine contact a poison control center or emergency room at once. NOTE: This medicine is only for you. Do not share this medicine with others. What if I miss a dose? It is important not to miss your dose. Call your doctor or health care professional if you are unable to keep an appointment. What may interact with this medicine? Interactions are not expected. This list may not describe all possible interactions. Give your health care provider a list of all the medicines, herbs, non-prescription drugs, or dietary supplements you use. Also tell them if you smoke, drink alcohol, or use illegal drugs. Some items may interact with your medicine. What should I watch for while using this  medicine? Your condition will be monitored carefully while you are receiving this medicine. Visit your prescriber or health care professional for regular checks on your progress and for the needed blood tests. It is important to keep all appointments. What side effects may I notice from receiving this medicine? Side effects that you should report to your doctor or health care professional as soon as possible: -allergic reactions like skin rash, itching or hives, swelling of the face, lips, or tongue -shortness of breath, chest pain, swelling in a leg -unusual bleeding or bruising Side effects that usually do not require medical attention (report to your doctor or health care professional if they continue or are bothersome): -dizziness -headache -muscle aches -pain in arms and legs -stomach pain -trouble sleeping This list may not describe all possible side effects. Call your doctor for medical advice about side effects. You may report side effects to FDA at 1-800-FDA-1088. Where should I keep my medicine? This drug is given in a hospital or clinic and will not be stored at home. NOTE: This sheet is a summary. It may not cover all possible information. If you have questions about this medicine, talk to your doctor, pharmacist, or health care provider.  2018 Elsevier/Gold Standard (2008-04-23 15:13:04)  

## 2017-10-12 ENCOUNTER — Other Ambulatory Visit: Payer: Self-pay | Admitting: Adult Health

## 2017-10-12 NOTE — Telephone Encounter (Signed)
Last filled by Dr. [name] but sent electronically as "fill later."

## 2017-10-13 ENCOUNTER — Other Ambulatory Visit: Payer: Self-pay | Admitting: Cardiology

## 2017-10-13 DIAGNOSIS — Z760 Encounter for issue of repeat prescription: Secondary | ICD-10-CM

## 2017-10-20 NOTE — Telephone Encounter (Signed)
Pt requesting refill for Furosemide.  Last seen by you 10/18.  Last BUN 38 Crea 2.50 on 10/08/2017.  Do you want to refill?

## 2017-10-21 ENCOUNTER — Ambulatory Visit: Payer: Medicare Other

## 2017-10-21 ENCOUNTER — Other Ambulatory Visit: Payer: Medicare Other

## 2017-10-22 ENCOUNTER — Other Ambulatory Visit: Payer: Self-pay

## 2017-10-22 ENCOUNTER — Ambulatory Visit: Payer: Medicare Other

## 2017-10-22 ENCOUNTER — Other Ambulatory Visit: Payer: Medicare Other

## 2017-10-22 ENCOUNTER — Inpatient Hospital Stay: Payer: Medicare Other

## 2017-10-22 ENCOUNTER — Inpatient Hospital Stay: Payer: Medicare Other | Admitting: *Deleted

## 2017-10-22 VITALS — BP 152/47 | HR 62 | Temp 99.3°F | Resp 20

## 2017-10-22 DIAGNOSIS — D509 Iron deficiency anemia, unspecified: Secondary | ICD-10-CM | POA: Diagnosis not present

## 2017-10-22 DIAGNOSIS — D696 Thrombocytopenia, unspecified: Secondary | ICD-10-CM

## 2017-10-22 DIAGNOSIS — D693 Immune thrombocytopenic purpura: Secondary | ICD-10-CM | POA: Diagnosis not present

## 2017-10-22 LAB — CBC WITH DIFFERENTIAL (CANCER CENTER ONLY)
Basophils Absolute: 0 10*3/uL (ref 0.0–0.1)
Basophils Relative: 0 %
Eosinophils Absolute: 0.2 10*3/uL (ref 0.0–0.5)
Eosinophils Relative: 4 %
HCT: 32.1 % — ABNORMAL LOW (ref 34.8–46.6)
Hemoglobin: 10.8 g/dL — ABNORMAL LOW (ref 11.6–15.9)
Lymphocytes Relative: 23 %
Lymphs Abs: 1.2 10*3/uL (ref 0.9–3.3)
MCH: 33.1 pg (ref 26.0–34.0)
MCHC: 33.6 g/dL (ref 32.0–36.0)
MCV: 98.5 fL (ref 81.0–101.0)
Monocytes Absolute: 0.4 10*3/uL (ref 0.1–0.9)
Monocytes Relative: 8 %
Neutro Abs: 3.3 10*3/uL (ref 1.5–6.5)
Neutrophils Relative %: 65 %
Platelet Count: 86 10*3/uL — ABNORMAL LOW (ref 145–400)
RBC: 3.26 MIL/uL — ABNORMAL LOW (ref 3.70–5.32)
RDW: 17.3 % — ABNORMAL HIGH (ref 11.1–15.7)
WBC Count: 5.1 10*3/uL (ref 3.9–10.0)

## 2017-10-22 MED ORDER — ROMIPLOSTIM INJECTION 500 MCG
450.0000 ug | Freq: Once | SUBCUTANEOUS | Status: AC
  Administered 2017-10-22: 450 ug via SUBCUTANEOUS
  Filled 2017-10-22: qty 0.9

## 2017-10-22 NOTE — Patient Instructions (Signed)
Romiplostim injection What is this medicine? ROMIPLOSTIM (roe mi PLOE stim) helps your body make more platelets. This medicine is used to treat low platelets caused by chronic idiopathic thrombocytopenic purpura (ITP). This medicine may be used for other purposes; ask your health care provider or pharmacist if you have questions. COMMON BRAND NAME(S): Nplate What should I tell my health care provider before I take this medicine? They need to know if you have any of these conditions: -cancer or myelodysplastic syndrome -low blood counts, like low white cell, platelet, or red cell counts -take medicines that treat or prevent blood clots -an unusual or allergic reaction to romiplostim, mannitol, other medicines, foods, dyes, or preservatives -pregnant or trying to get pregnant -breast-feeding How should I use this medicine? This medicine is for injection under the skin. It is given by a health care professional in a hospital or clinic setting. A special MedGuide will be given to you before your injection. Read this information carefully each time. Talk to your pediatrician regarding the use of this medicine in children. Special care may be needed. Overdosage: If you think you have taken too much of this medicine contact a poison control center or emergency room at once. NOTE: This medicine is only for you. Do not share this medicine with others. What if I miss a dose? It is important not to miss your dose. Call your doctor or health care professional if you are unable to keep an appointment. What may interact with this medicine? Interactions are not expected. This list may not describe all possible interactions. Give your health care provider a list of all the medicines, herbs, non-prescription drugs, or dietary supplements you use. Also tell them if you smoke, drink alcohol, or use illegal drugs. Some items may interact with your medicine. What should I watch for while using this  medicine? Your condition will be monitored carefully while you are receiving this medicine. Visit your prescriber or health care professional for regular checks on your progress and for the needed blood tests. It is important to keep all appointments. What side effects may I notice from receiving this medicine? Side effects that you should report to your doctor or health care professional as soon as possible: -allergic reactions like skin rash, itching or hives, swelling of the face, lips, or tongue -shortness of breath, chest pain, swelling in a leg -unusual bleeding or bruising Side effects that usually do not require medical attention (report to your doctor or health care professional if they continue or are bothersome): -dizziness -headache -muscle aches -pain in arms and legs -stomach pain -trouble sleeping This list may not describe all possible side effects. Call your doctor for medical advice about side effects. You may report side effects to FDA at 1-800-FDA-1088. Where should I keep my medicine? This drug is given in a hospital or clinic and will not be stored at home. NOTE: This sheet is a summary. It may not cover all possible information. If you have questions about this medicine, talk to your doctor, pharmacist, or health care provider.  2018 Elsevier/Gold Standard (2008-04-23 15:13:04)  

## 2017-10-25 NOTE — Progress Notes (Signed)
Patient received nplate today per MD parameters

## 2017-11-04 NOTE — Telephone Encounter (Signed)
OK to refill. Take as needed, PRN. [name], MD

## 2017-11-05 ENCOUNTER — Inpatient Hospital Stay: Payer: Medicare Other | Attending: Hematology & Oncology

## 2017-11-05 ENCOUNTER — Other Ambulatory Visit: Payer: Self-pay

## 2017-11-05 ENCOUNTER — Inpatient Hospital Stay: Payer: Medicare Other

## 2017-11-05 VITALS — BP 174/54 | HR 67 | Temp 97.9°F | Resp 18

## 2017-11-05 DIAGNOSIS — E119 Type 2 diabetes mellitus without complications: Secondary | ICD-10-CM | POA: Insufficient documentation

## 2017-11-05 DIAGNOSIS — D509 Iron deficiency anemia, unspecified: Secondary | ICD-10-CM | POA: Insufficient documentation

## 2017-11-05 DIAGNOSIS — D696 Thrombocytopenia, unspecified: Secondary | ICD-10-CM

## 2017-11-05 DIAGNOSIS — Z794 Long term (current) use of insulin: Secondary | ICD-10-CM | POA: Diagnosis not present

## 2017-11-05 DIAGNOSIS — D693 Immune thrombocytopenic purpura: Secondary | ICD-10-CM | POA: Diagnosis not present

## 2017-11-05 DIAGNOSIS — Z86711 Personal history of pulmonary embolism: Secondary | ICD-10-CM | POA: Insufficient documentation

## 2017-11-05 DIAGNOSIS — G609 Hereditary and idiopathic neuropathy, unspecified: Secondary | ICD-10-CM | POA: Insufficient documentation

## 2017-11-05 LAB — CBC WITH DIFFERENTIAL (CANCER CENTER ONLY)
Basophils Absolute: 0 10*3/uL (ref 0.0–0.1)
Basophils Relative: 0 %
Eosinophils Absolute: 0.2 10*3/uL (ref 0.0–0.5)
Eosinophils Relative: 3 %
HCT: 34.2 % — ABNORMAL LOW (ref 34.8–46.6)
Hemoglobin: 11.4 g/dL — ABNORMAL LOW (ref 11.6–15.9)
Lymphocytes Relative: 22 %
Lymphs Abs: 1.4 10*3/uL (ref 0.9–3.3)
MCH: 32.7 pg (ref 26.0–34.0)
MCHC: 33.3 g/dL (ref 32.0–36.0)
MCV: 98 fL (ref 81.0–101.0)
Monocytes Absolute: 0.4 10*3/uL (ref 0.1–0.9)
Monocytes Relative: 7 %
Neutro Abs: 4.3 10*3/uL (ref 1.5–6.5)
Neutrophils Relative %: 68 %
Platelet Count: 159 10*3/uL (ref 145–400)
RBC: 3.49 MIL/uL — ABNORMAL LOW (ref 3.70–5.32)
RDW: 17.2 % — ABNORMAL HIGH (ref 11.1–15.7)
WBC Count: 6.4 10*3/uL (ref 3.9–10.0)

## 2017-11-05 MED ORDER — ROMIPLOSTIM INJECTION 500 MCG
450.0000 ug | Freq: Once | SUBCUTANEOUS | Status: AC
  Administered 2017-11-05: 450 ug via SUBCUTANEOUS
  Filled 2017-11-05: qty 0.9

## 2017-11-05 NOTE — Patient Instructions (Signed)
Romiplostim injection What is this medicine? ROMIPLOSTIM (roe mi PLOE stim) helps your body make more platelets. This medicine is used to treat low platelets caused by chronic idiopathic thrombocytopenic purpura (ITP). This medicine may be used for other purposes; ask your health care provider or pharmacist if you have questions. COMMON BRAND NAME(S): Nplate What should I tell my health care provider before I take this medicine? They need to know if you have any of these conditions: -cancer or myelodysplastic syndrome -low blood counts, like low white cell, platelet, or red cell counts -take medicines that treat or prevent blood clots -an unusual or allergic reaction to romiplostim, mannitol, other medicines, foods, dyes, or preservatives -pregnant or trying to get pregnant -breast-feeding How should I use this medicine? This medicine is for injection under the skin. It is given by a health care professional in a hospital or clinic setting. A special MedGuide will be given to you before your injection. Read this information carefully each time. Talk to your pediatrician regarding the use of this medicine in children. Special care may be needed. Overdosage: If you think you have taken too much of this medicine contact a poison control center or emergency room at once. NOTE: This medicine is only for you. Do not share this medicine with others. What if I miss a dose? It is important not to miss your dose. Call your doctor or health care professional if you are unable to keep an appointment. What may interact with this medicine? Interactions are not expected. This list may not describe all possible interactions. Give your health care provider a list of all the medicines, herbs, non-prescription drugs, or dietary supplements you use. Also tell them if you smoke, drink alcohol, or use illegal drugs. Some items may interact with your medicine. What should I watch for while using this  medicine? Your condition will be monitored carefully while you are receiving this medicine. Visit your prescriber or health care professional for regular checks on your progress and for the needed blood tests. It is important to keep all appointments. What side effects may I notice from receiving this medicine? Side effects that you should report to your doctor or health care professional as soon as possible: -allergic reactions like skin rash, itching or hives, swelling of the face, lips, or tongue -shortness of breath, chest pain, swelling in a leg -unusual bleeding or bruising Side effects that usually do not require medical attention (report to your doctor or health care professional if they continue or are bothersome): -dizziness -headache -muscle aches -pain in arms and legs -stomach pain -trouble sleeping This list may not describe all possible side effects. Call your doctor for medical advice about side effects. You may report side effects to FDA at 1-800-FDA-1088. Where should I keep my medicine? This drug is given in a hospital or clinic and will not be stored at home. NOTE: This sheet is a summary. It may not cover all possible information. If you have questions about this medicine, talk to your doctor, pharmacist, or health care provider.  2018 Elsevier/Gold Standard (2008-04-23 15:13:04)  

## 2017-11-13 ENCOUNTER — Other Ambulatory Visit: Payer: Self-pay | Admitting: Adult Health

## 2017-11-18 ENCOUNTER — Ambulatory Visit (INDEPENDENT_AMBULATORY_CARE_PROVIDER_SITE_OTHER): Payer: Medicare Other | Admitting: Internal Medicine

## 2017-11-18 ENCOUNTER — Encounter: Payer: Self-pay | Admitting: Internal Medicine

## 2017-11-18 VITALS — BP 156/62 | HR 88 | Ht 61.0 in | Wt 251.8 lb

## 2017-11-18 DIAGNOSIS — Z6841 Body Mass Index (BMI) 40.0 and over, adult: Secondary | ICD-10-CM | POA: Diagnosis not present

## 2017-11-18 DIAGNOSIS — E1122 Type 2 diabetes mellitus with diabetic chronic kidney disease: Secondary | ICD-10-CM

## 2017-11-18 DIAGNOSIS — Z794 Long term (current) use of insulin: Secondary | ICD-10-CM

## 2017-11-18 DIAGNOSIS — N184 Chronic kidney disease, stage 4 (severe): Secondary | ICD-10-CM

## 2017-11-18 LAB — POCT GLYCOSYLATED HEMOGLOBIN (HGB A1C): Hemoglobin A1C: 8.2

## 2017-11-18 MED ORDER — INSULIN NPH (HUMAN) (ISOPHANE) 100 UNIT/ML ~~LOC~~ SUSP: 50.0000 [IU] | Freq: Two times a day (BID) | SUBCUTANEOUS | 11 refills | Status: DC

## 2017-11-18 MED ORDER — INSULIN REGULAR HUMAN 100 UNIT/ML IJ SOLN: 10.0000 [IU] | Freq: Three times a day (TID) | INTRAMUSCULAR | 11 refills | Status: DC

## 2017-11-18 NOTE — Patient Instructions (Addendum)
Please continue: - Glipizide 10 mg 2x a day before meals  Please stop the 70/30 insulin and start the following: Insulin Before breakfast Before lunch Before dinner  Regular 25 10  25   NPH 50  50  Please inject the insulin 30 min before meals.  Please let me know if the sugars are consistently <90 or >200.  Please return in 1.5 months with your sugar log.

## 2017-11-18 NOTE — Progress Notes (Signed)
Patient ID: [name], female   DOB: August 06, 1935, 83 y.o.   MRN: [id number]   HPI: [name] is a 83 y.o.-year-old female, returning for f/u for DM2, dx in late 1990s, insulin-dependent since 2012, uncontrolled, with complications (CKD stage 4, CHF, + DR). She prev. Saw Dr. Chalmers [name]. Last visit with me 3 mo ago.  She is here with her daughter who offers part of the history especially regarding patient's past medical history and her sugars.  She is still on a mostly  plant-based diet but sugars higher than at last visit.  She has anemia and thrombocytopenia and is on Platelet infusions.  Last hemoglobin A1c was: 08/19/2017: HbA1c calculated from fructosamine is 6.7% Lab Results  Component Value Date   HGBA1C 7.1 (H) 08/19/2017   HGBA1C 6.2 06/02/2017   HGBA1C 7.0 02/17/2017   Pt was on a regimen of: - Glipizide 10 mg bid AFTER meals (increased recently) - Humulin 70/30 70-15 (forgets)-65 units AFTER meals   At last visit, we moved these before meals: - Glipizide 10 mg 2x a day before meals - Humulin 70/30: - 70 units after b'fast - 15 units after lunch - 70-75 units after dinner.  Pt checks her sugars twice a day: - am: 160-226, 267, 359 >> 141-206, 253 with the infusion >> 158-252, 283 - 2h after b'fast: n/c >> 290 >> n/c - before lunch: n/c >> 171-188, 219 >> n/c - 2h after lunch: n/c - before dinner: 170-267 >> 131-208, 368 (infusion) >> 174-250, 284 - 2h after dinner: n/c - bedtime: n/c >> 369 - nighttime: n/c Lowest sugar was 60 >> 131 >> 131; she has hypoglycemia awareness at 100.  Highest sugar was 500s x 1 lately >> 368 >> 369.  Glucometer: True Metrix air  Pt's meals are: - Breakfast: grits, bacon, green tea + stevia - Lunch: 1/2 sandwich + salad + chips and drink - snack: fruit - Dinner:meat + veggies + occas. starch - Snacks: 2 - usually salty  She does yoga and physical therapy twice a week.  - + CKD, last BUN/creatinine:  Lab Results  Component  Value Date   BUN 38 (H) 10/08/2017   BUN 45 (H) 09/15/2017   CREATININE 2.50 (H) 10/08/2017   CREATININE 2.42 (H) 09/15/2017   Lab Results  Component Value Date   GFRNONAA 17 (L) 10/08/2017   GFRNONAA 18 (L) 09/15/2017   GFRNONAA 21 (L) 06/28/2017   GFRNONAA 29 (L) 04/23/2017   GFRNONAA 28 (L) 02/09/2017   GFRNONAA 28 (L) 02/02/2017   GFRNONAA 32 (L) 01/26/2017   GFRNONAA 19 (L) 10/14/2015   GFRNONAA 15 (L) 08/29/2015   GFRNONAA 19 (L) 08/28/2015  He has a h/o uric acid kidney stones.  - + HL. last set of lipids: Lab Results  Component Value Date   CHOL 166 12/15/2016   HDL 30.50 (L) 12/15/2016   LDLCALC 65 08/16/2009   LDLDIRECT 54.0 12/15/2016   TRIG (H) 12/15/2016    513.0 Triglyceride is over 400; calculations on Lipids are invalid.   CHOLHDL 5 12/15/2016  On Lipitor, fenofibrate. - last eye exam was in 05/2017: + DR -+ Numbness and tingling in her toes.  She has urinary incontinence >> started Myrbetriq >> developed A fib >> started Eloquis >> had bleeding >> received blood transfusion.  She also has a history of hypothyroidism and takes 112 mcg daily of levothyroxine  Most recent TSH was normal: Lab Results  Component Value Date   TSH 1.82  12/15/2016    ROS: Constitutional: no weight gain/no weight loss, + fatigue, no subjective hyperthermia, no subjective hypothermia, + nocturia Eyes: no blurry vision, no xerophthalmia ENT: no sore throat, no nodules palpated in throat, no dysphagia, no odynophagia, no hoarseness Cardiovascular: no CP/+ SOB/no palpitations/no leg swelling Respiratory: no cough/+ SOB/no wheezing Gastrointestinal: no N/no V/no D/no C/no acid reflux Musculoskeletal: + muscle aches/+ joint aches Skin: no rashes, + hair loss Neurological: no tremors/+ numbness/+ tingling/no dizziness  I reviewed pt's medications, allergies, PMH, social hx, family hx, and changes were documented in the history of present illness. Otherwise, unchanged from my  initial visit note.  Past Medical History:  Diagnosis Date  . Allergic rhinitis   . Anxiety state, unspecified    panic attacks  . CHF (congestive heart failure) ([place])   . Depressive disorder, not elsewhere classified   . Extrinsic asthma, unspecified    no problem since adulthood  . Obesity   . OSA on CPAP    severe  . Pure hypercholesterolemia   . Respiratory failure with hypoxia ([place]) 09/2008   acute, secondary to multiple bilateral pulmonary embolism , negative hypercoagulable workup 09/2008 hospital stay  . Scoliosis   . Syncope and collapse   . Type II or unspecified type diabetes mellitus without mention of complication, not stated as uncontrolled   . Unspecified essential hypertension   . Unspecified hypothyroidism    hypo   Past Surgical History:  Procedure Laterality Date  . EYE SURGERY    . JOINT REPLACEMENT    . knee replaced    . THYROID SURGERY     Social History   Social History  . Marital status: Widowed    Spouse name: N/A  . Number of children: 2   Occupational History  . 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 Smoking status: Former Smoker    Years: 20.00    Quit date: 09/07/1989  . Smokeless tobacco: Never Used  . Alcohol use No  . Drug use: No   Current Outpatient Medications on File Prior to Visit  Medication Sig Dispense Refill  . albuterol (PROVENTIL HFA;VENTOLIN HFA) 108 (90 Base) MCG/ACT inhaler Inhale 1-2 puffs into the lungs every 6 (six) hours as needed for wheezing or shortness of breath. 1 Inhaler 0  . atorvastatin (LIPITOR) 40 MG tablet Take 40 mg by mouth daily.    Marland Kitchen azithromycin (ZITHROMAX) 250 MG tablet Take 1 tablet (250 mg total) by mouth daily. Take first 2 tablets together, then 1 every day until finished. 6 tablet 0  . BIOTIN PO Take 1 tablet by mouth daily.    . Calcium Carbonate-Vit D-Min (CALCIUM 1200 PO) Take 1 tablet by mouth daily.    . Cholecalciferol (VITAMIN D3) 2000  units TABS Take 2,000 Units by mouth daily.    . Coenzyme Q10 200 MG capsule Take 200 mg by mouth daily.      Marland Kitchen docusate sodium (COLACE) 100 MG capsule Take 1 capsule by mouth daily.    . fenofibrate 54 MG tablet TAKE 1 TABLET(54 MG) BY MOUTH DAILY 90 tablet 1  . fexofenadine (ALLEGRA) 180 MG tablet Take 180 mg by mouth daily.      . furosemide (LASIX) 20 MG tablet Take 2 tablets (40 mg total) by mouth daily as needed. 90 tablet 1  . furosemide (LASIX) 20 MG tablet TAKE 2 TABLETS BY MOUTH EVERY DAY 180 tablet 2  .  glipiZIDE (GLUCOTROL) 10 MG tablet Take 10 mg by mouth 2 (two) times daily before a meal.    . insulin NPH-regular Human (HUMULIN 70/30) (70-30) 100 UNIT/ML injection Inject 70 units under the skin in the morning, 15 units at noon and 75 units every evening. 30 mL 3  . Insulin Pen Needle (B-D UF III MINI PEN NEEDLES) 31G X 5 MM MISC Use daily for insulin injection 100 each 2  . Insulin Syringe-Needle U-100 (INSULIN SYRINGE 1CC/30GX5/16") 30G X 5/16" 1 ML MISC USE BID UTD  11  . levothyroxine (SYNTHROID, LEVOTHROID) 112 MCG tablet Take 1 tablet (112 mcg total) by mouth daily. 90 tablet 3  . Multiple Vitamin (MULTIVITAMIN) tablet Take 1 tablet by mouth daily.      . potassium chloride SA (K-DUR,KLOR-CON) 20 MEQ tablet Take 20 mEq by mouth 2 (two) times daily.    . Probiotic Product (PROBIOTIC PO) Take 1 capsule by mouth daily.    . ranitidine (ZANTAC) 150 MG tablet Take 150 mg by mouth daily as needed for heartburn.    . sertraline (ZOLOFT) 50 MG tablet Take 1 tablet (50 mg total) daily by mouth. 90 tablet 1  . vitamin C (ASCORBIC ACID) 250 MG tablet Take 250 mg by mouth daily.     No current facility-administered medications on file prior to visit.    Allergies  Allergen Reactions  . Apixaban Other (See Comments)    Internal Bleeding  Patient states internal bleeding.  . Aspirin Itching, Rash, Hives and Swelling    Swelling of her tongue  . Mirabegron Other (See Comments)     Patient experienced A-Fib  . Metformin Diarrhea and Nausea Only  . Pineapple Swelling    Throat swells and blisters on tongue and roof of mouth per patient  . Tetracycline Hives  . Fluticasone-Salmeterol Itching and Rash  . Iodinated Diagnostic Agents Itching and Rash       . Lactose Intolerance (Gi) Other (See Comments)    gas  . Latex Itching, Rash and Other (See Comments)    Pt. States latex pulls her skin off.   . Lisinopril Cough  . Metronidazole Other (See Comments)    Unknown   . Sulfa Antibiotics Rash  . Sulfonamide Derivatives Itching and Rash   Family History  Problem Relation Age of Onset  . Allergies Mother   . Clotting disorder Mother   . Osteoarthritis Mother   . Asthma Mother   . Arthritis Other   . Diabetes Other   . Hyperlipidemia Other   . Hypertension Other   . Coronary artery disease Other   . Stroke Other   . Osteoarthritis Daughter   . Rheum arthritis Maternal Grandmother   . Clotting disorder Maternal Grandmother   . Clotting disorder Maternal Uncle   . Clotting disorder Daughter   . Allergies Daughter   . Breast cancer Neg Hx    PE: BP (!) 156/76 (BP Location: Right Arm, Patient Position: Sitting, Cuff Size: Large)   Pulse 88   Ht 5\' 1"  (1.549 m)   Wt 251 lb 12.8 oz (114.2 kg)   LMP  (LMP Unknown)   SpO2 96%   BMI 47.58 kg/m  Wt Readings from Last 3 Encounters:  11/18/17 251 lb 12.8 oz (114.2 kg)  10/08/17 249 lb 1.9 oz (113 kg)  09/29/17 250 lb 1.9 oz (113.5 kg)   Constitutional: overweight, in NAD, uses a walker Eyes: PERRLA, EOMI, no exophthalmos ENT: moist mucous membranes, no thyromegaly, no cervical  lymphadenopathy Cardiovascular: RRR, + 1/6 SEM, no RG Respiratory: CTA B Gastrointestinal: abdomen soft, NT, ND, BS+ Musculoskeletal: no deformities, strength intact in all 4 Skin: moist, warm, no rashes Neurological: no tremor with outstretched hands, DTR normal in all 4  ASSESSMENT: 1. DM2, insulin-dependent, uncontrolled,  with complications - CKD stage 4 - CHF - + DR  2. Obesity class 3  PLAN:  1. Patient with long standing, uncontrolled DM2, on premixed insulin regimen and also Glipizide, with improved sugars at last visit, but still above goal in am >> we increased the dose before dinner. She was missing the mid-day insulin dose most of the time >> discussed to try to not forget it >> she is doing better with this - at this visit, sugars are higher >> we will change to a basal-bolus insulin regimen >> discussed about and given written instructions about how to mix the insulin, also, not to keep the insulin in use in the fridge, to inject in stomach rather than arms as she is doing now. Also, again advised her to move the insulin before meals as she continues to take it after meals.  - insulin is expensive >> again recommended to get it from [facility] - I suggested to:  Patient Instructions   Please continue: - Glipizide 10 mg 2x a day before meals  Please stop the 70/30 insulin and start the following: Insulin Before breakfast Before lunch Before dinner  Regular 25 10  25   NPH 50  50  Please inject the insulin 30 min before meals.  Please let me know if the sugars are consistently <90 or >200.  Please return in 1.5 months with your sugar log.    - today, HbA1c is 8.2% - she is still getting iron infusions so her HbA1c levels are not very accurate >> will check a fructosamine level also. - continue checking sugars at different times of the day - check 3x a day, rotating checks - advised for yearly eye exams >> she is UTD - Return to clinic in 3 mo with sugar log    2. Obesity class 3 - weight not decreased despite the mostly plant-based diet - discussed the need to stay more active >> will try to go to Silver sneakers   Philemon Kingdom, MD PhD [facility]

## 2017-11-19 ENCOUNTER — Other Ambulatory Visit: Payer: Self-pay

## 2017-11-19 ENCOUNTER — Inpatient Hospital Stay: Payer: Medicare Other

## 2017-11-19 ENCOUNTER — Inpatient Hospital Stay (HOSPITAL_BASED_OUTPATIENT_CLINIC_OR_DEPARTMENT_OTHER): Payer: Medicare Other | Admitting: Family

## 2017-11-19 VITALS — BP 140/60 | HR 58 | Temp 98.4°F | Resp 19 | Wt 251.0 lb

## 2017-11-19 DIAGNOSIS — D696 Thrombocytopenia, unspecified: Secondary | ICD-10-CM

## 2017-11-19 DIAGNOSIS — E119 Type 2 diabetes mellitus without complications: Secondary | ICD-10-CM | POA: Diagnosis not present

## 2017-11-19 DIAGNOSIS — Z86711 Personal history of pulmonary embolism: Secondary | ICD-10-CM | POA: Diagnosis not present

## 2017-11-19 DIAGNOSIS — D509 Iron deficiency anemia, unspecified: Secondary | ICD-10-CM | POA: Diagnosis not present

## 2017-11-19 DIAGNOSIS — G609 Hereditary and idiopathic neuropathy, unspecified: Secondary | ICD-10-CM | POA: Diagnosis not present

## 2017-11-19 DIAGNOSIS — D693 Immune thrombocytopenic purpura: Secondary | ICD-10-CM | POA: Diagnosis not present

## 2017-11-19 DIAGNOSIS — Z794 Long term (current) use of insulin: Secondary | ICD-10-CM | POA: Diagnosis not present

## 2017-11-19 LAB — CMP (CANCER CENTER ONLY)
ALT: 24 U/L (ref 10–47)
AST: 25 U/L (ref 11–38)
Albumin: 3.8 g/dL (ref 3.5–5.0)
Alkaline Phosphatase: 48 U/L (ref 26–84)
Anion gap: 15 (ref 5–15)
BUN: 41 mg/dL — ABNORMAL HIGH (ref 7–22)
CO2: 31 mmol/L (ref 18–33)
Calcium: 9.7 mg/dL (ref 8.0–10.3)
Chloride: 97 mmol/L — ABNORMAL LOW (ref 98–108)
Creatinine: 2.3 mg/dL — ABNORMAL HIGH (ref 0.60–1.20)
Glucose, Bld: 303 mg/dL — ABNORMAL HIGH (ref 73–118)
Potassium: 4.4 mmol/L (ref 3.3–4.7)
Sodium: 143 mmol/L (ref 128–145)
Total Bilirubin: 1 mg/dL (ref 0.2–1.6)
Total Protein: 7.4 g/dL (ref 6.4–8.1)

## 2017-11-19 LAB — CBC WITH DIFFERENTIAL (CANCER CENTER ONLY)
Basophils Absolute: 0 10*3/uL (ref 0.0–0.1)
Basophils Relative: 0 %
Eosinophils Absolute: 0.2 10*3/uL (ref 0.0–0.5)
Eosinophils Relative: 3 %
HCT: 32.2 % — ABNORMAL LOW (ref 34.8–46.6)
Hemoglobin: 10.7 g/dL — ABNORMAL LOW (ref 11.6–15.9)
Lymphocytes Relative: 22 %
Lymphs Abs: 1.4 10*3/uL (ref 0.9–3.3)
MCH: 32.6 pg (ref 26.0–34.0)
MCHC: 33.2 g/dL (ref 32.0–36.0)
MCV: 98.2 fL (ref 81.0–101.0)
Monocytes Absolute: 0.5 10*3/uL (ref 0.1–0.9)
Monocytes Relative: 8 %
Neutro Abs: 4.1 10*3/uL (ref 1.5–6.5)
Neutrophils Relative %: 67 %
Platelet Count: 130 10*3/uL — ABNORMAL LOW (ref 145–400)
RBC: 3.28 MIL/uL — ABNORMAL LOW (ref 3.70–5.32)
RDW: 17.3 % — ABNORMAL HIGH (ref 11.1–15.7)
WBC Count: 6.1 10*3/uL (ref 3.9–10.0)

## 2017-11-19 MED ORDER — ROMIPLOSTIM INJECTION 500 MCG
3.9500 ug/kg | Freq: Once | SUBCUTANEOUS | Status: AC
  Administered 2017-11-19: 450 ug via SUBCUTANEOUS
  Filled 2017-11-19: qty 0.9

## 2017-11-19 NOTE — Progress Notes (Signed)
Hematology and Oncology Follow Up Visit  [name] [id number] [birth year] 83 y.o. 11/19/2017   Principle Diagnosis:  Immune based thrombocytopenia History of PE Iron deficiency anemia  Current Therapy:   Nplate as indicated IV Iron as indicated - dose given on 07/07/2017   Interim History:  [name] is here today with her daughter for follow-up. She is doing well and platelet count today is 130.  She has had no episodes of bleeding, no bruising or petechiae.  Her blood sugars have been very poorly controlled. She has started a new insulin regimen yesterday that will hopefully finally be able to regulate this. She is still eating a plant based diet and staying well hydrated. Her weight is stable.  No lymphadenopathy noted on exam. No fever, chills, n/v, cough, rash, chest pain, palpitations, abdominal pain or changes in bowel or bladder habits.  She has SOB with over exertion and will take breaks to rest as needed.  She has had a couple episodes of dizziness since starting a new medication for high BP.  She is wearing her compression stockings which help with the puffiness in her lower extremities. The neuropathy in her feet is unchanged. She uses her rolling walker when ambulating for support.  She denies any falls or syncopal episodes.   ECOG Performance Status: 1 - Symptomatic but completely ambulatory  Medications:  Allergies as of 11/19/2017      Reactions   Apixaban Other (See Comments)   Internal Bleeding Patient states internal bleeding.   Aspirin Itching, Rash, Hives, Swelling   Swelling of her tongue   Mirabegron Other (See Comments)   Patient experienced A-Fib   Metformin Diarrhea, Nausea Only   Pineapple Swelling   Throat swells and blisters on tongue and roof of mouth per patient   Tetracycline Hives   Fluticasone-salmeterol Itching, Rash   Iodinated Diagnostic Agents Itching, Rash      Lactose Intolerance (gi) Other (See Comments)   gas   Latex  Itching, Rash, Other (See Comments)   Pt. States latex pulls her skin off.    Lisinopril Cough   Metronidazole Other (See Comments)   Unknown   Sulfa Antibiotics Rash   Sulfonamide Derivatives Itching, Rash      Medication List        Accurate as of 11/19/17  1:32 PM. Always use your most recent med list.          albuterol 108 (90 Base) MCG/ACT inhaler Commonly known as:  PROVENTIL HFA;VENTOLIN HFA Inhale 1-2 puffs into the lungs every 6 (six) hours as needed for wheezing or shortness of breath.   atorvastatin 40 MG tablet Commonly known as:  LIPITOR Take 40 mg by mouth daily.   azithromycin 250 MG tablet Commonly known as:  ZITHROMAX Take 1 tablet (250 mg total) by mouth daily. Take first 2 tablets together, then 1 every day until finished.   BIOTIN PO Take 1 tablet by mouth daily.   CALCIUM 1200 PO Take 1 tablet by mouth daily.   Coenzyme Q10 200 MG capsule Take 200 mg by mouth daily.   docusate sodium 100 MG capsule Commonly known as:  COLACE Take 1 capsule by mouth daily.   fenofibrate 54 MG tablet TAKE 1 TABLET(54 MG) BY MOUTH DAILY   fexofenadine 180 MG tablet Commonly known as:  ALLEGRA Take 180 mg by mouth daily.   furosemide 20 MG tablet Commonly known as:  LASIX Take 2 tablets (40 mg total) by mouth daily as needed.  glipiZIDE 10 MG tablet Commonly known as:  GLUCOTROL Take 10 mg by mouth 2 (two) times daily before a meal.   insulin NPH Human 100 UNIT/ML injection Commonly known as:  NOVOLIN N RELION Inject 0.5 mLs (50 Units total) into the skin 2 (two) times daily before a meal.   Insulin Pen Needle 31G X 5 MM Misc Commonly known as:  B-D UF III MINI PEN NEEDLES Use daily for insulin injection   insulin regular 100 units/mL injection Commonly known as:  NOVOLIN R RELION Inject 0.1-0.25 mLs (10-25 Units total) into the skin 3 (three) times daily before meals.   INSULIN SYRINGE 1CC/30GX5/16" 30G X 5/16" 1 ML Misc USE BID UTD     levothyroxine 112 MCG tablet Commonly known as:  SYNTHROID, LEVOTHROID Take 1 tablet (112 mcg total) by mouth daily.   multivitamin tablet Take 1 tablet by mouth daily.   potassium chloride SA 20 MEQ tablet Commonly known as:  K-DUR,KLOR-CON Take 20 mEq by mouth 2 (two) times daily.   PROBIOTIC PO Take 1 capsule by mouth daily.   ranitidine 150 MG tablet Commonly known as:  ZANTAC Take 150 mg by mouth daily as needed for heartburn.   sertraline 50 MG tablet Commonly known as:  ZOLOFT Take 1 tablet (50 mg total) daily by mouth.   vitamin C 250 MG tablet Commonly known as:  ASCORBIC ACID Take 250 mg by mouth daily.   Vitamin D3 2000 units Tabs Take 2,000 Units by mouth daily.       Allergies:  Allergies  Allergen Reactions  . Apixaban Other (See Comments)    Internal Bleeding  Patient states internal bleeding.  . Aspirin Itching, Rash, Hives and Swelling    Swelling of her tongue  . Mirabegron Other (See Comments)    Patient experienced A-Fib  . Metformin Diarrhea and Nausea Only  . Pineapple Swelling    Throat swells and blisters on tongue and roof of mouth per patient  . Tetracycline Hives  . Fluticasone-Salmeterol Itching and Rash  . Iodinated Diagnostic Agents Itching and Rash       . Lactose Intolerance (Gi) Other (See Comments)    gas  . Latex Itching, Rash and Other (See Comments)    Pt. States latex pulls her skin off.   . Lisinopril Cough  . Metronidazole Other (See Comments)    Unknown   . Sulfa Antibiotics Rash  . Sulfonamide Derivatives Itching and Rash    Past Medical History, Surgical history, Social history, and Family History were reviewed and updated.  Review of Systems: All other 10 point review of systems is negative.   Physical Exam:  vitals were not taken for this visit.   Wt Readings from Last 3 Encounters:  11/18/17 251 lb 12.8 oz (114.2 kg)  10/08/17 249 lb 1.9 oz (113 kg)  09/29/17 250 lb 1.9 oz (113.5 kg)     Ocular: Sclerae unicteric, pupils equal, round and reactive to light Ear-nose-throat: Oropharynx clear, dentition fair Lymphatic: No cervical, supraclavicular or axillary adenopathy Lungs no rales or rhonchi, good excursion bilaterally Heart regular rate and rhythm, no murmur appreciated Abd soft, nontender, positive bowel sounds, no liver or spleen tip palpated on exam, no fluid wave  MSK no focal spinal tenderness, no joint edema Neuro: non-focal, well-oriented, appropriate affect Breasts: Deferred   Lab Results  Component Value Date   WBC 6.1 11/19/2017   HGB 10.7 (L) 09/15/2017   HCT 32.2 (L) 11/19/2017   MCV 98.2 11/19/2017  PLT 130 (L) 11/19/2017   Lab Results  Component Value Date   FERRITIN 222 07/28/2017   IRON 101 07/28/2017   TIBC 393 07/28/2017   UIBC 291 07/28/2017   IRONPCTSAT 26 07/28/2017   Lab Results  Component Value Date   RETICCTPCT 1.9 01/28/2011   RBC 3.28 (L) 11/19/2017   RETICCTABS 73.3 01/28/2011   No results found for: KPAFRELGTCHN, LAMBDASER, KAPLAMBRATIO No results found for: Kandis Cocking, IGMSERUM No results found for: Odetta Pink, SPEI   Chemistry      Component Value Date/Time   NA 142 10/08/2017 1112   NA 145 09/08/2017 1317   NA 142 02/25/2017 1013   K 4.8 10/08/2017 1112   K 4.5 09/08/2017 1317   K 3.8 02/25/2017 1013   CL 98 10/08/2017 1112   CL 98 09/08/2017 1317   CO2 30 (H) 10/08/2017 1112   CO2 32 09/08/2017 1317   CO2 26 02/25/2017 1013   BUN 38 (H) 10/08/2017 1112   BUN 34 (H) 09/08/2017 1317   BUN 20.1 02/25/2017 1013   CREATININE 2.50 (H) 10/08/2017 1112   CREATININE 1.9 (H) 09/08/2017 1317   CREATININE 1.8 (H) 02/25/2017 1013      Component Value Date/Time   CALCIUM 9.5 10/08/2017 1112   CALCIUM 9.3 09/08/2017 1317   CALCIUM 8.7 02/25/2017 1013   ALKPHOS 42 10/08/2017 1112   ALKPHOS 45 09/08/2017 1317   ALKPHOS 65 02/25/2017 1013   AST 25 10/08/2017  1112   AST 16 02/25/2017 1013   ALT 19 10/08/2017 1112   ALT 26 09/08/2017 1317   ALT 13 02/25/2017 1013   BILITOT 0.9 10/08/2017 1112   BILITOT 0.76 02/25/2017 1013       Impression and Plan: [name] is a very pleasant 83 yo caucasian fe,ale with immune based thrombocytopenia. Her platelet count is stable at 130. She has tolerated Nplate nicely.  She will get her injection today as planned.  Her biggest issue at this time seems to be diabetes which her endocrinologist is working hard to get under control. Hopefully this new insulin regimen with help.  We will see her every 3 weeks for lab and Nplate and follow-up in 6 weeks.  She will contact our office with any questions or concerns. We can certainly see her sooner if need be.   Laverna Peace, NP 3/15/20191:32 PM

## 2017-11-21 ENCOUNTER — Other Ambulatory Visit: Payer: Self-pay | Admitting: Adult Health

## 2017-11-21 DIAGNOSIS — Z760 Encounter for issue of repeat prescription: Secondary | ICD-10-CM

## 2017-11-23 IMAGING — US US RENAL
1 series · 14 of 25 positions shown · non-contrast
Comparison: CT Abdomen and Pelvis 10/30/2008

CLINICAL DATA: 80-year-old female with acute renal injury. Initial
encounter.

EXAM:
RENAL / URINARY TRACT ULTRASOUND COMPLETE

[Series 1: us renal · 0.24mm/px · 14 of 36 slices shown]
[im 1/36]
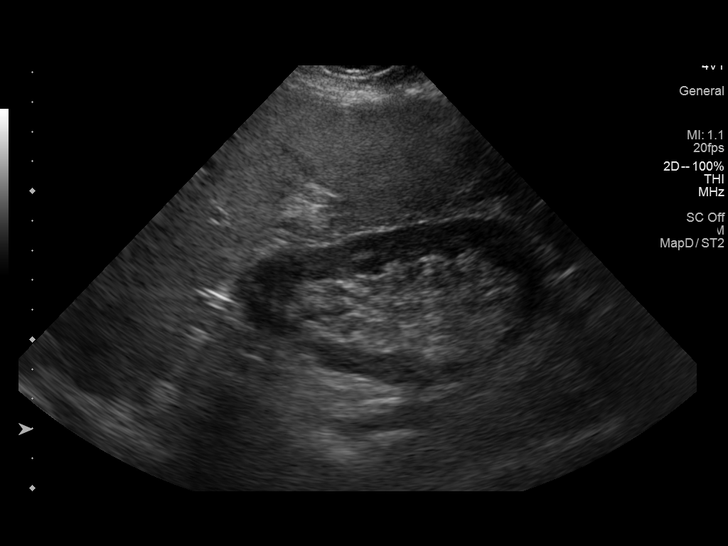
[im 3/36]
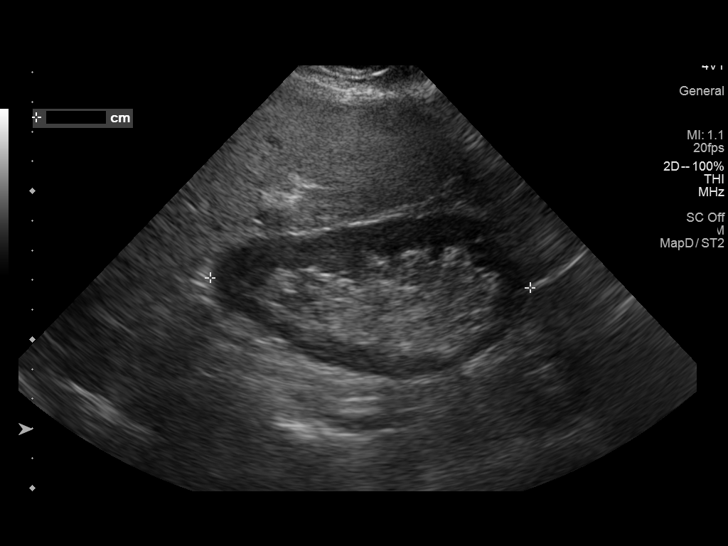
[im 6/36]
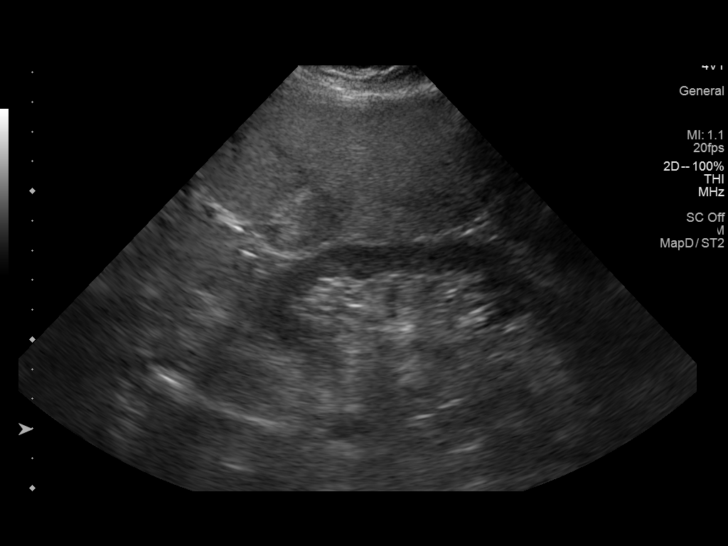
[im 9/36]
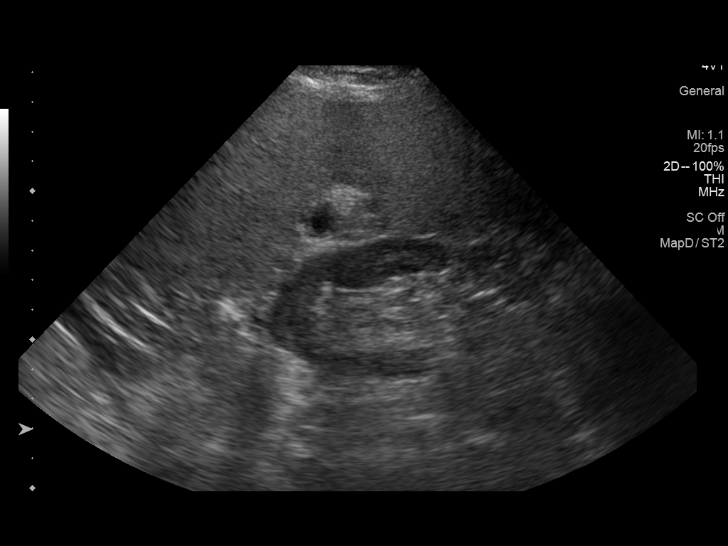
[im 12/36]
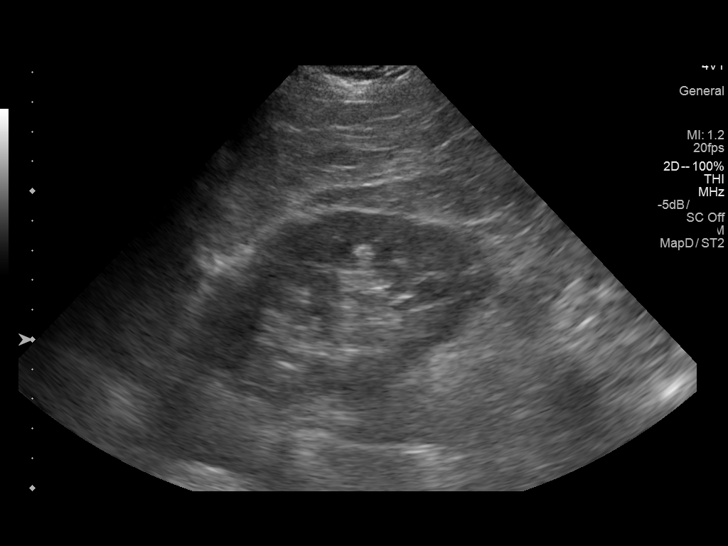
[im 14/36]
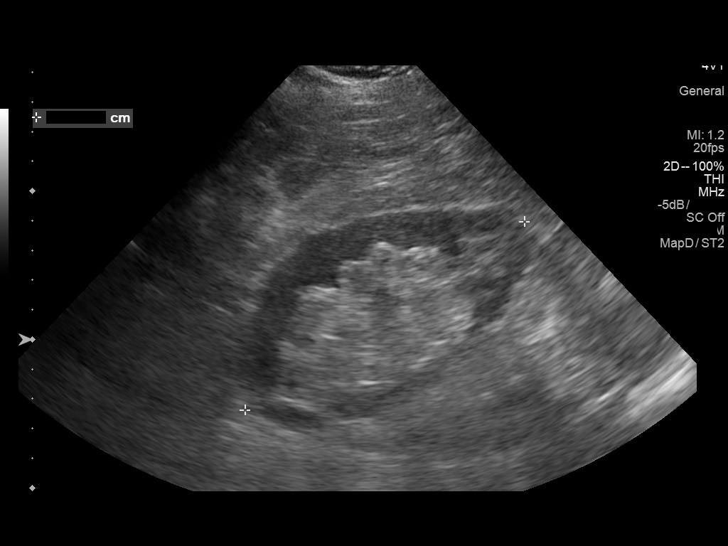
[im 17/36]
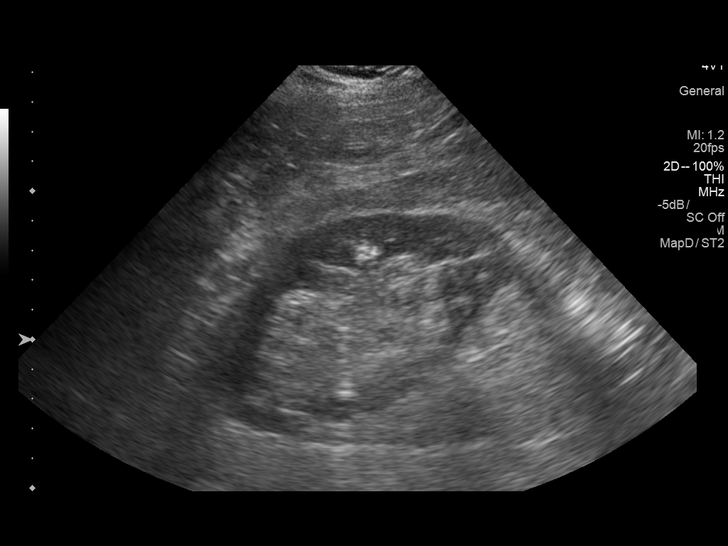
[im 19/36]
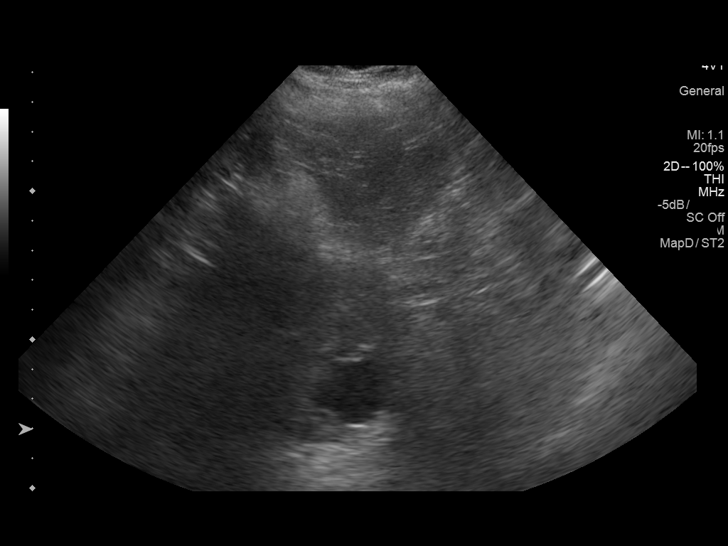
[im 22/36]
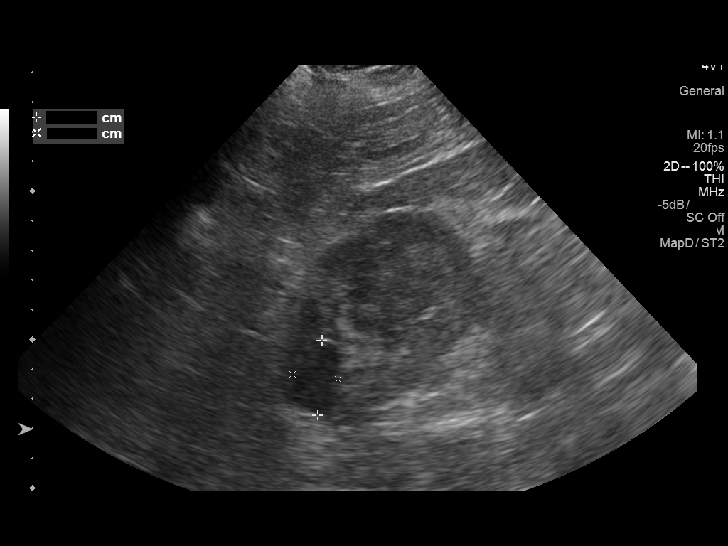
[im 24/36]
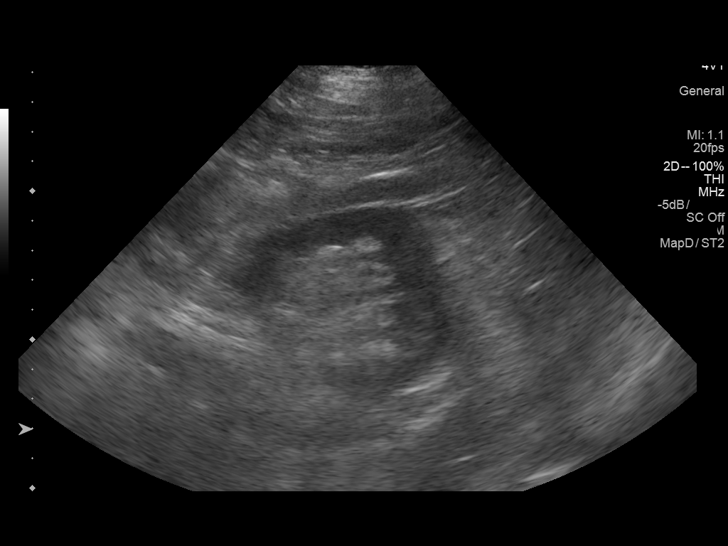
[im 27/36]
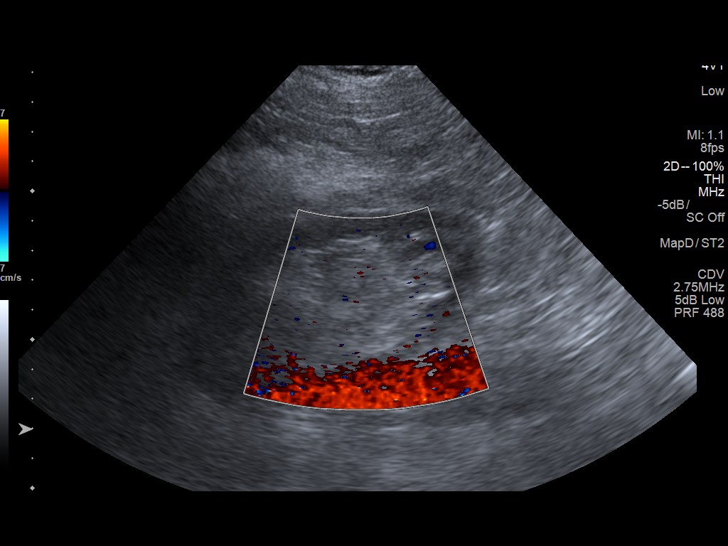
[im 30/36]
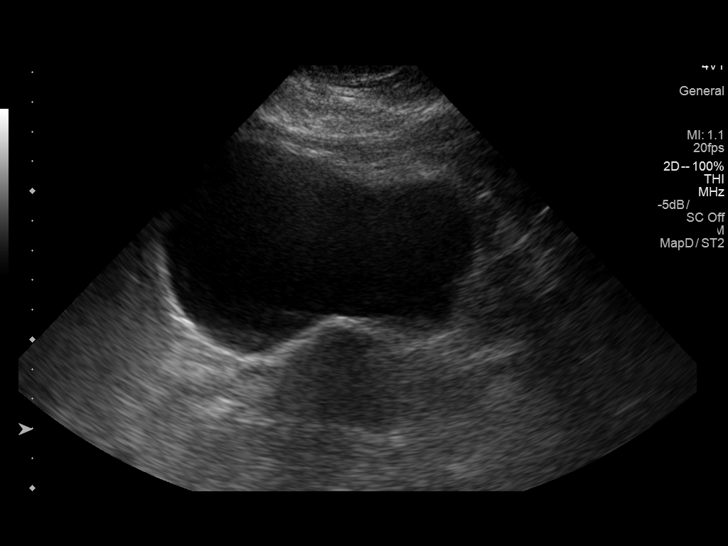
[im 33/36]
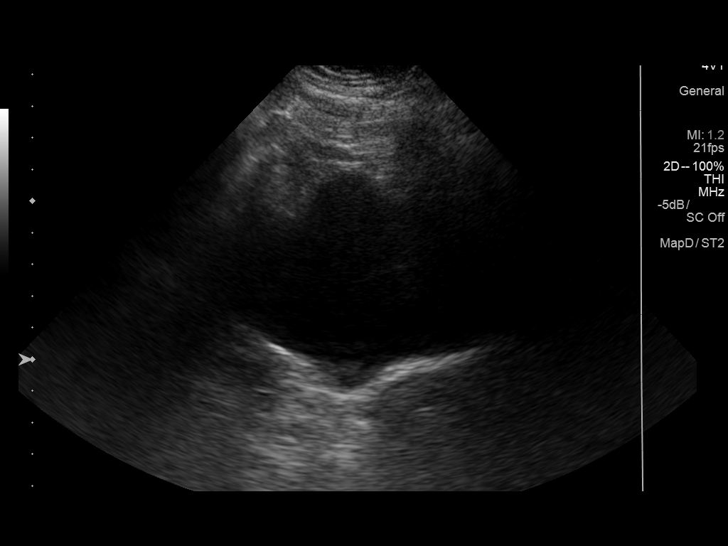
[im 36/36]
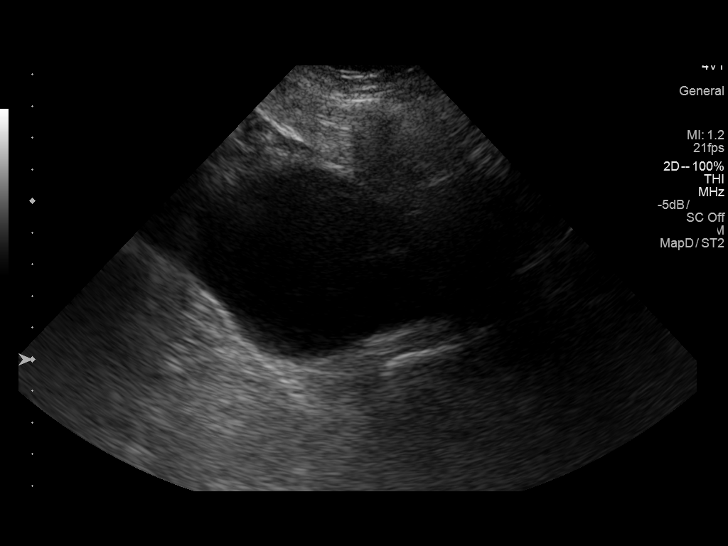

[14 of 25 positions shown; findings below may reference images not displayed]

FINDINGS: Right Kidney:

Length: 10.8 cm. Renal cortical volume loss appears congruent with
age. Echogenicity within normal limits. No mass or hydronephrosis
visualized.

Left Kidney:

Length: 11.4 cm. Cortical volume loss appears greater than that on
the right side (image 15). Questionable nonobstructing midpole
nephrolithiasis on image 19. Small simple appearing exophytic lower
pole cyst (image 21).. Cortical echogenicity within normal limits.
No hydronephrosis or left renal mass.

Bladder:

Appears normal for degree of bladder distention.

Other findings: Hepatic steatosis (image 3).
IMPRESSION: 1. No hydronephrosis or acute renal findings. Negative urinary
bladder.
2. Left greater than right renal cortical volume loss. Questionable
nonobstructing left renal midpole calculus.
3. Hepatic steatosis.

## 2017-11-24 ENCOUNTER — Other Ambulatory Visit: Payer: Self-pay | Admitting: Adult Health

## 2017-11-24 DIAGNOSIS — Z760 Encounter for issue of repeat prescription: Secondary | ICD-10-CM

## 2017-11-24 NOTE — Telephone Encounter (Signed)
Sent to the pharmacy by e-scribe for 60 days.  Pt has upcoming annual visit on 12/31/17.

## 2017-12-10 ENCOUNTER — Inpatient Hospital Stay: Payer: Medicare Other

## 2017-12-13 ENCOUNTER — Inpatient Hospital Stay: Payer: Medicare Other

## 2017-12-13 ENCOUNTER — Other Ambulatory Visit: Payer: Self-pay | Admitting: Adult Health

## 2017-12-13 ENCOUNTER — Inpatient Hospital Stay: Payer: Medicare Other | Attending: Hematology & Oncology

## 2017-12-13 DIAGNOSIS — E119 Type 2 diabetes mellitus without complications: Secondary | ICD-10-CM | POA: Diagnosis not present

## 2017-12-13 DIAGNOSIS — D696 Thrombocytopenia, unspecified: Secondary | ICD-10-CM

## 2017-12-13 DIAGNOSIS — Z86711 Personal history of pulmonary embolism: Secondary | ICD-10-CM | POA: Diagnosis not present

## 2017-12-13 DIAGNOSIS — G629 Polyneuropathy, unspecified: Secondary | ICD-10-CM | POA: Diagnosis not present

## 2017-12-13 DIAGNOSIS — R0602 Shortness of breath: Secondary | ICD-10-CM | POA: Insufficient documentation

## 2017-12-13 DIAGNOSIS — D693 Immune thrombocytopenic purpura: Secondary | ICD-10-CM | POA: Insufficient documentation

## 2017-12-13 DIAGNOSIS — D509 Iron deficiency anemia, unspecified: Secondary | ICD-10-CM | POA: Diagnosis not present

## 2017-12-13 DIAGNOSIS — Z79899 Other long term (current) drug therapy: Secondary | ICD-10-CM | POA: Diagnosis not present

## 2017-12-13 LAB — CBC WITH DIFFERENTIAL (CANCER CENTER ONLY)
Basophils Absolute: 0 10*3/uL (ref 0.0–0.1)
Basophils Relative: 0 %
Eosinophils Absolute: 0.3 10*3/uL (ref 0.0–0.5)
Eosinophils Relative: 4 %
HCT: 32.8 % — ABNORMAL LOW (ref 34.8–46.6)
Hemoglobin: 10.9 g/dL — ABNORMAL LOW (ref 11.6–15.9)
Lymphocytes Relative: 25 %
Lymphs Abs: 1.5 10*3/uL (ref 0.9–3.3)
MCH: 32.2 pg (ref 26.0–34.0)
MCHC: 33.2 g/dL (ref 32.0–36.0)
MCV: 97 fL (ref 81.0–101.0)
Monocytes Absolute: 0.4 10*3/uL (ref 0.1–0.9)
Monocytes Relative: 8 %
Neutro Abs: 3.7 10*3/uL (ref 1.5–6.5)
Neutrophils Relative %: 63 %
Platelet Count: 66 10*3/uL — ABNORMAL LOW (ref 145–400)
RBC: 3.38 MIL/uL — ABNORMAL LOW (ref 3.70–5.32)
RDW: 17.8 % — ABNORMAL HIGH (ref 11.1–15.7)
WBC Count: 5.9 10*3/uL (ref 3.9–10.0)

## 2017-12-13 MED ORDER — ROMIPLOSTIM INJECTION 500 MCG
450.0000 ug | Freq: Once | SUBCUTANEOUS | Status: AC
  Administered 2017-12-13: 450 ug via SUBCUTANEOUS
  Filled 2017-12-13: qty 0.9

## 2017-12-13 NOTE — Patient Instructions (Signed)
Romiplostim injection What is this medicine? ROMIPLOSTIM (roe mi PLOE stim) helps your body make more platelets. This medicine is used to treat low platelets caused by chronic idiopathic thrombocytopenic purpura (ITP). This medicine may be used for other purposes; ask your health care provider or pharmacist if you have questions. COMMON BRAND NAME(S): Nplate What should I tell my health care provider before I take this medicine? They need to know if you have any of these conditions: -cancer or myelodysplastic syndrome -low blood counts, like low white cell, platelet, or red cell counts -take medicines that treat or prevent blood clots -an unusual or allergic reaction to romiplostim, mannitol, other medicines, foods, dyes, or preservatives -pregnant or trying to get pregnant -breast-feeding How should I use this medicine? This medicine is for injection under the skin. It is given by a health care professional in a hospital or clinic setting. A special MedGuide will be given to you before your injection. Read this information carefully each time. Talk to your pediatrician regarding the use of this medicine in children. Special care may be needed. Overdosage: If you think you have taken too much of this medicine contact a poison control center or emergency room at once. NOTE: This medicine is only for you. Do not share this medicine with others. What if I miss a dose? It is important not to miss your dose. Call your doctor or health care professional if you are unable to keep an appointment. What may interact with this medicine? Interactions are not expected. This list may not describe all possible interactions. Give your health care provider a list of all the medicines, herbs, non-prescription drugs, or dietary supplements you use. Also tell them if you smoke, drink alcohol, or use illegal drugs. Some items may interact with your medicine. What should I watch for while using this  medicine? Your condition will be monitored carefully while you are receiving this medicine. Visit your prescriber or health care professional for regular checks on your progress and for the needed blood tests. It is important to keep all appointments. What side effects may I notice from receiving this medicine? Side effects that you should report to your doctor or health care professional as soon as possible: -allergic reactions like skin rash, itching or hives, swelling of the face, lips, or tongue -shortness of breath, chest pain, swelling in a leg -unusual bleeding or bruising Side effects that usually do not require medical attention (report to your doctor or health care professional if they continue or are bothersome): -dizziness -headache -muscle aches -pain in arms and legs -stomach pain -trouble sleeping This list may not describe all possible side effects. Call your doctor for medical advice about side effects. You may report side effects to FDA at 1-800-FDA-1088. Where should I keep my medicine? This drug is given in a hospital or clinic and will not be stored at home. NOTE: This sheet is a summary. It may not cover all possible information. If you have questions about this medicine, talk to your doctor, pharmacist, or health care provider.  2018 Elsevier/Gold Standard (2008-04-23 15:13:04)  

## 2017-12-20 ENCOUNTER — Inpatient Hospital Stay: Payer: Medicare Other

## 2017-12-21 ENCOUNTER — Inpatient Hospital Stay: Payer: Medicare Other

## 2017-12-21 VITALS — BP 165/56 | HR 63 | Temp 98.1°F | Resp 22

## 2017-12-21 DIAGNOSIS — D693 Immune thrombocytopenic purpura: Secondary | ICD-10-CM | POA: Diagnosis not present

## 2017-12-21 DIAGNOSIS — D509 Iron deficiency anemia, unspecified: Secondary | ICD-10-CM | POA: Diagnosis not present

## 2017-12-21 DIAGNOSIS — D696 Thrombocytopenia, unspecified: Secondary | ICD-10-CM

## 2017-12-21 DIAGNOSIS — Z86711 Personal history of pulmonary embolism: Secondary | ICD-10-CM | POA: Diagnosis not present

## 2017-12-21 DIAGNOSIS — G629 Polyneuropathy, unspecified: Secondary | ICD-10-CM | POA: Diagnosis not present

## 2017-12-21 DIAGNOSIS — R0602 Shortness of breath: Secondary | ICD-10-CM | POA: Diagnosis not present

## 2017-12-21 DIAGNOSIS — E119 Type 2 diabetes mellitus without complications: Secondary | ICD-10-CM | POA: Diagnosis not present

## 2017-12-21 LAB — CMP (CANCER CENTER ONLY)
ALT: 28 U/L (ref 10–47)
AST: 28 U/L (ref 11–38)
Albumin: 3.9 g/dL (ref 3.5–5.0)
Alkaline Phosphatase: 46 U/L (ref 26–84)
Anion gap: 12 (ref 5–15)
BUN: 42 mg/dL — ABNORMAL HIGH (ref 7–22)
CO2: 31 mmol/L (ref 18–33)
Calcium: 9.4 mg/dL (ref 8.0–10.3)
Chloride: 98 mmol/L (ref 98–108)
Creatinine: 2.3 mg/dL — ABNORMAL HIGH (ref 0.60–1.20)
Glucose, Bld: 337 mg/dL — ABNORMAL HIGH (ref 73–118)
Potassium: 4.6 mmol/L (ref 3.3–4.7)
Sodium: 141 mmol/L (ref 128–145)
Total Bilirubin: 0.9 mg/dL (ref 0.2–1.6)
Total Protein: 7.4 g/dL (ref 6.4–8.1)

## 2017-12-21 LAB — CBC WITH DIFFERENTIAL (CANCER CENTER ONLY)
Basophils Absolute: 0 10*3/uL (ref 0.0–0.1)
Basophils Relative: 0 %
Eosinophils Absolute: 0.2 10*3/uL (ref 0.0–0.5)
Eosinophils Relative: 4 %
HCT: 32.4 % — ABNORMAL LOW (ref 34.8–46.6)
Hemoglobin: 10.9 g/dL — ABNORMAL LOW (ref 11.6–15.9)
Lymphocytes Relative: 23 %
Lymphs Abs: 1.5 10*3/uL (ref 0.9–3.3)
MCH: 32.6 pg (ref 26.0–34.0)
MCHC: 33.6 g/dL (ref 32.0–36.0)
MCV: 97 fL (ref 81.0–101.0)
Monocytes Absolute: 0.4 10*3/uL (ref 0.1–0.9)
Monocytes Relative: 7 %
Neutro Abs: 4.2 10*3/uL (ref 1.5–6.5)
Neutrophils Relative %: 66 %
Platelet Count: 163 10*3/uL (ref 145–400)
RBC: 3.34 MIL/uL — ABNORMAL LOW (ref 3.70–5.32)
RDW: 18 % — ABNORMAL HIGH (ref 11.1–15.7)
WBC Count: 6.3 10*3/uL (ref 3.9–10.0)

## 2017-12-21 MED ORDER — ROMIPLOSTIM INJECTION 500 MCG
450.0000 ug | Freq: Once | SUBCUTANEOUS | Status: AC
Start: 2017-12-21 — End: 2017-12-21
  Administered 2017-12-21: 450 ug via SUBCUTANEOUS
  Filled 2017-12-21: qty 0.9

## 2017-12-22 ENCOUNTER — Telehealth: Payer: Self-pay | Admitting: Internal Medicine

## 2017-12-22 NOTE — Telephone Encounter (Signed)
Patient has been trying to get medication-Glipizide-she has been out of medication for 2 weeks-Pharmacy has faxed form over to [place] 8 times with no response. [facility] [address]. Dr. Carlisle Cater used to prescribe the above medication but now Dr. Cruzita Lederer prescribes it.

## 2017-12-23 MED ORDER — GLIPIZIDE 10 MG PO TABS: 10.0000 mg | ORAL_TABLET | Freq: Two times a day (BID) | ORAL | 1 refills | Status: DC

## 2017-12-23 NOTE — Telephone Encounter (Signed)
Spoke to patient. Informed Rx sent. Pt says Rx was being sent to PCP office. Not sure why it was not forwarded or why the pharmacy did not submit request to our office.

## 2017-12-31 ENCOUNTER — Other Ambulatory Visit: Payer: Self-pay

## 2017-12-31 ENCOUNTER — Other Ambulatory Visit: Payer: Self-pay | Admitting: Family

## 2017-12-31 ENCOUNTER — Encounter: Payer: Self-pay | Admitting: Family

## 2017-12-31 ENCOUNTER — Inpatient Hospital Stay (HOSPITAL_BASED_OUTPATIENT_CLINIC_OR_DEPARTMENT_OTHER): Payer: Medicare Other | Admitting: Family

## 2017-12-31 ENCOUNTER — Inpatient Hospital Stay: Payer: Medicare Other

## 2017-12-31 ENCOUNTER — Ambulatory Visit: Payer: BLUE CROSS/BLUE SHIELD | Admitting: Adult Health

## 2017-12-31 VITALS — BP 160/47 | HR 69 | Temp 98.0°F | Resp 20 | Wt 255.0 lb

## 2017-12-31 DIAGNOSIS — D693 Immune thrombocytopenic purpura: Secondary | ICD-10-CM

## 2017-12-31 DIAGNOSIS — Z86711 Personal history of pulmonary embolism: Secondary | ICD-10-CM | POA: Diagnosis not present

## 2017-12-31 DIAGNOSIS — D696 Thrombocytopenia, unspecified: Secondary | ICD-10-CM

## 2017-12-31 DIAGNOSIS — D500 Iron deficiency anemia secondary to blood loss (chronic): Secondary | ICD-10-CM

## 2017-12-31 DIAGNOSIS — E119 Type 2 diabetes mellitus without complications: Secondary | ICD-10-CM

## 2017-12-31 DIAGNOSIS — D509 Iron deficiency anemia, unspecified: Secondary | ICD-10-CM | POA: Diagnosis not present

## 2017-12-31 DIAGNOSIS — G629 Polyneuropathy, unspecified: Secondary | ICD-10-CM

## 2017-12-31 DIAGNOSIS — R0602 Shortness of breath: Secondary | ICD-10-CM

## 2017-12-31 LAB — CMP (CANCER CENTER ONLY)
ALT: 23 U/L (ref 0–55)
AST: 25 U/L (ref 5–34)
Albumin: 3.9 g/dL (ref 3.5–5.0)
Alkaline Phosphatase: 47 U/L (ref 40–150)
Anion gap: 12 — ABNORMAL HIGH (ref 3–11)
BUN: 35 mg/dL — ABNORMAL HIGH (ref 7–26)
CO2: 29 mmol/L (ref 22–29)
Calcium: 9.7 mg/dL (ref 8.4–10.4)
Chloride: 99 mmol/L (ref 98–109)
Creatinine: 2.1 mg/dL — ABNORMAL HIGH (ref 0.60–1.10)
GFR, Est AFR Am: 24 mL/min — ABNORMAL LOW (ref >60)
GFR, Estimated: 21 mL/min — ABNORMAL LOW (ref >60)
Glucose, Bld: 260 mg/dL — ABNORMAL HIGH (ref 70–140)
Potassium: 4.4 mmol/L (ref 3.5–5.1)
Sodium: 140 mmol/L (ref 136–145)
Total Bilirubin: 0.6 mg/dL (ref 0.2–1.2)
Total Protein: 7.7 g/dL (ref 6.4–8.3)

## 2017-12-31 LAB — CBC WITH DIFFERENTIAL (CANCER CENTER ONLY)
Basophils Absolute: 0 10*3/uL (ref 0.0–0.1)
Basophils Relative: 0 %
Eosinophils Absolute: 0.2 10*3/uL (ref 0.0–0.5)
Eosinophils Relative: 4 %
HCT: 32.5 % — ABNORMAL LOW (ref 34.8–46.6)
Hemoglobin: 10.7 g/dL — ABNORMAL LOW (ref 11.6–15.9)
Lymphocytes Relative: 19 %
Lymphs Abs: 1.1 10*3/uL (ref 0.9–3.3)
MCH: 32.3 pg (ref 26.0–34.0)
MCHC: 32.9 g/dL (ref 32.0–36.0)
MCV: 98.2 fL (ref 81.0–101.0)
Monocytes Absolute: 0.4 10*3/uL (ref 0.1–0.9)
Monocytes Relative: 8 %
Neutro Abs: 3.9 10*3/uL (ref 1.5–6.5)
Neutrophils Relative %: 69 %
Platelet Count: 135 10*3/uL — ABNORMAL LOW (ref 145–400)
RBC: 3.31 MIL/uL — ABNORMAL LOW (ref 3.70–5.32)
RDW: 18.2 % — ABNORMAL HIGH (ref 11.1–15.7)
WBC Count: 5.6 10*3/uL (ref 3.9–10.0)

## 2017-12-31 MED ORDER — ROMIPLOSTIM INJECTION 500 MCG
450.0000 ug | Freq: Once | SUBCUTANEOUS | Status: AC
  Administered 2017-12-31: 450 ug via SUBCUTANEOUS
  Filled 2017-12-31: qty 0.9

## 2017-12-31 NOTE — Patient Instructions (Signed)
Romiplostim injection What is this medicine? ROMIPLOSTIM (roe mi PLOE stim) helps your body make more platelets. This medicine is used to treat low platelets caused by chronic idiopathic thrombocytopenic purpura (ITP). This medicine may be used for other purposes; ask your health care provider or pharmacist if you have questions. COMMON BRAND NAME(S): Nplate What should I tell my health care provider before I take this medicine? They need to know if you have any of these conditions: -cancer or myelodysplastic syndrome -low blood counts, like low white cell, platelet, or red cell counts -take medicines that treat or prevent blood clots -an unusual or allergic reaction to romiplostim, mannitol, other medicines, foods, dyes, or preservatives -pregnant or trying to get pregnant -breast-feeding How should I use this medicine? This medicine is for injection under the skin. It is given by a health care professional in a hospital or clinic setting. A special MedGuide will be given to you before your injection. Read this information carefully each time. Talk to your pediatrician regarding the use of this medicine in children. Special care may be needed. Overdosage: If you think you have taken too much of this medicine contact a poison control center or emergency room at once. NOTE: This medicine is only for you. Do not share this medicine with others. What if I miss a dose? It is important not to miss your dose. Call your doctor or health care professional if you are unable to keep an appointment. What may interact with this medicine? Interactions are not expected. This list may not describe all possible interactions. Give your health care provider a list of all the medicines, herbs, non-prescription drugs, or dietary supplements you use. Also tell them if you smoke, drink alcohol, or use illegal drugs. Some items may interact with your medicine. What should I watch for while using this  medicine? Your condition will be monitored carefully while you are receiving this medicine. Visit your prescriber or health care professional for regular checks on your progress and for the needed blood tests. It is important to keep all appointments. What side effects may I notice from receiving this medicine? Side effects that you should report to your doctor or health care professional as soon as possible: -allergic reactions like skin rash, itching or hives, swelling of the face, lips, or tongue -shortness of breath, chest pain, swelling in a leg -unusual bleeding or bruising Side effects that usually do not require medical attention (report to your doctor or health care professional if they continue or are bothersome): -dizziness -headache -muscle aches -pain in arms and legs -stomach pain -trouble sleeping This list may not describe all possible side effects. Call your doctor for medical advice about side effects. You may report side effects to FDA at 1-800-FDA-1088. Where should I keep my medicine? This drug is given in a hospital or clinic and will not be stored at home. NOTE: This sheet is a summary. It may not cover all possible information. If you have questions about this medicine, talk to your doctor, pharmacist, or health care provider.  2018 Elsevier/Gold Standard (2008-04-23 15:13:04)  

## 2017-12-31 NOTE — Progress Notes (Signed)
Hematology and Oncology Follow Up Visit  DOVE GRESHAM [id number] 1934-12-15 83 y.o. 12/31/2017   Principle Diagnosis:  Immune based thrombocytopenia History of PE Iron deficiency anemia  Current Therapy:   Nplate as indicated IV Iron as indicated - dose given on 07/07/2017   Interim History:  Ms. Mortell is here today for follow-up. She is feeling quite fatigued. She has been at the beach and there was not a recliner for her to sleep in so she had to sit up in a chair. She plans to rest when she gets home today.  She has SOB with over exertion and is concerned with her weight gain. He blood sugars are poorly controlled. She has a new patient appointment with [place] Dr. Edilia Bo next week and plans to discuss these issues with her at that time.  No fever, chills, n/v, cough, rash, dizziness, SOB, chest pain, palpitations, abdominal pain or changes in bowel or bladder habits.  She has swelling and redness in her lower extremities she states due to venostasis. She plans to ask for a referral to vascular. Pedal pulses are +2.  Neuropathy in her feet is unchanged. No recent falls, no syncope.  She has a good appetite and is staying well hydrated. Her weight is stable.   ECOG Performance Status: 1 - Symptomatic but completely ambulatory  Medications:  Allergies as of 12/31/2017      Reactions   Apixaban Other (See Comments)   Internal Bleeding Patient states internal bleeding.   Aspirin Itching, Rash, Hives, Swelling   Swelling of her tongue   Mirabegron Other (See Comments)   Patient experienced A-Fib   Metformin Diarrhea, Nausea Only   Pineapple Swelling   Throat swells and blisters on tongue and roof of mouth per patient   Tetracycline Hives   Fluticasone-salmeterol Itching, Rash   Iodinated Diagnostic Agents Itching, Rash      Lactose Intolerance (gi) Other (See Comments)   gas   Latex Itching, Rash, Other (See Comments)   Pt. States latex pulls her skin off.    Lisinopril Cough   Metronidazole Other (See Comments)   Unknown   Sulfa Antibiotics Rash   Sulfonamide Derivatives Itching, Rash      Medication List        Accurate as of 12/31/17 12:15 PM. Always use your most recent med list.          amlodipine-atorvastatin 2.5-10 MG tablet Commonly known as:  CADUET Take 1 tablet by mouth daily. 1 tab @@ bedtime.   atorvastatin 40 MG tablet Commonly known as:  LIPITOR Take 40 mg by mouth daily.   BIOTIN PO Take 1 tablet by mouth daily.   CALCIUM 1200 PO Take 1 tablet by mouth daily.   Coenzyme Q10 200 MG capsule Take 200 mg by mouth daily.   docusate sodium 100 MG capsule Commonly known as:  COLACE Take 1 capsule by mouth daily.   fenofibrate 54 MG tablet TAKE 1 TABLET(54 MG) BY MOUTH DAILY   fexofenadine 180 MG tablet Commonly known as:  ALLEGRA Take 180 mg by mouth daily.   furosemide 20 MG tablet Commonly known as:  LASIX Take 2 tablets (40 mg total) by mouth daily as needed.   glipiZIDE 10 MG tablet Commonly known as:  GLUCOTROL Take 1 tablet (10 mg total) by mouth 2 (two) times daily before a meal.   insulin NPH Human 100 UNIT/ML injection Commonly known as:  NOVOLIN N RELION Inject 0.5 mLs (50 Units total) into the  skin 2 (two) times daily before a meal.   Insulin Pen Needle 31G X 5 MM Misc Commonly known as:  B-D UF III MINI PEN NEEDLES Use daily for insulin injection   insulin regular 100 units/mL injection Commonly known as:  NOVOLIN R RELION Inject 0.1-0.25 mLs (10-25 Units total) into the skin 3 (three) times daily before meals.   INSULIN SYRINGE 1CC/30GX5/16" 30G X 5/16" 1 ML Misc USE BID UTD   levothyroxine 112 MCG tablet Commonly known as:  SYNTHROID, LEVOTHROID TAKE 1 TABLET BY MOUTH DAILY   multivitamin tablet Take 1 tablet by mouth daily.   potassium chloride SA 20 MEQ tablet Commonly known as:  K-DUR,KLOR-CON Take 20 mEq by mouth 2 (two) times daily.   potassium citrate 10 MEQ (1080  MG) SR tablet Commonly known as:  UROCIT-K TK 2 TABLETS PO BID WITH MEALS   PROBIOTIC PO Take 1 capsule by mouth daily.   ranitidine 150 MG tablet Commonly known as:  ZANTAC Take 150 mg by mouth daily as needed for heartburn.   sertraline 50 MG tablet Commonly known as:  ZOLOFT Take 1 tablet (50 mg total) daily by mouth.   vitamin C 250 MG tablet Commonly known as:  ASCORBIC ACID Take 250 mg by mouth daily.   Vitamin D3 2000 units Tabs Take 2,000 Units by mouth daily.       Allergies:  Allergies  Allergen Reactions  . Apixaban Other (See Comments)    Internal Bleeding  Patient states internal bleeding.  . Aspirin Itching, Rash, Hives and Swelling    Swelling of her tongue  . Mirabegron Other (See Comments)    Patient experienced A-Fib  . Metformin Diarrhea and Nausea Only  . Pineapple Swelling    Throat swells and blisters on tongue and roof of mouth per patient  . Tetracycline Hives  . Fluticasone-Salmeterol Itching and Rash  . Iodinated Diagnostic Agents Itching and Rash       . Lactose Intolerance (Gi) Other (See Comments)    gas  . Latex Itching, Rash and Other (See Comments)    Pt. States latex pulls her skin off.   . Lisinopril Cough  . Metronidazole Other (See Comments)    Unknown   . Sulfa Antibiotics Rash  . Sulfonamide Derivatives Itching and Rash    Past Medical History, Surgical history, Social history, and Family History were reviewed and updated.  Review of Systems: All other 10 point review of systems is negative.   Physical Exam:  weight is 255 lb (115.7 kg). Her oral temperature is 98 F (36.7 C). Her blood pressure is 160/47 (abnormal) and her pulse is 69. Her respiration is 20 and oxygen saturation is 95%.   Wt Readings from Last 3 Encounters:  12/31/17 255 lb (115.7 kg)  11/19/17 251 lb (113.9 kg)  11/18/17 251 lb 12.8 oz (114.2 kg)    Ocular: Sclerae unicteric, pupils equal, round and reactive to light Ear-nose-throat:  Oropharynx clear, dentition fair Lymphatic: No cervical, supraclavicular or axillary adenopathy Lungs no rales or rhonchi, good excursion bilaterally Heart regular rate and rhythm, no murmur appreciated Abd soft, nontender, positive bowel sounds, no liver or spleen tip palpated on exam, no fluid wave  MSK no focal spinal tenderness, no joint edema Neuro: non-focal, well-oriented, appropriate affect Breasts: Deferred   Lab Results  Component Value Date   WBC 5.6 12/31/2017   HGB 10.7 (L) 12/31/2017   HCT 32.5 (L) 12/31/2017   MCV 98.2 12/31/2017   PLT  135 (L) 12/31/2017   Lab Results  Component Value Date   FERRITIN 222 07/28/2017   IRON 101 07/28/2017   TIBC 393 07/28/2017   UIBC 291 07/28/2017   IRONPCTSAT 26 07/28/2017   Lab Results  Component Value Date   RETICCTPCT 1.9 01/28/2011   RBC 3.31 (L) 12/31/2017   RETICCTABS 73.3 01/28/2011   No results found for: KPAFRELGTCHN, LAMBDASER, KAPLAMBRATIO No results found for: IGGSERUM, IGA, IGMSERUM No results found for: Odetta Pink, SPEI   Chemistry      Component Value Date/Time   NA 141 12/21/2017 1315   NA 145 09/08/2017 1317   NA 142 02/25/2017 1013   K 4.6 12/21/2017 1315   K 4.5 09/08/2017 1317   K 3.8 02/25/2017 1013   CL 98 12/21/2017 1315   CL 98 09/08/2017 1317   CO2 31 12/21/2017 1315   CO2 32 09/08/2017 1317   CO2 26 02/25/2017 1013   BUN 42 (H) 12/21/2017 1315   BUN 34 (H) 09/08/2017 1317   BUN 20.1 02/25/2017 1013   CREATININE 2.30 (H) 12/21/2017 1315   CREATININE 1.9 (H) 09/08/2017 1317   CREATININE 1.8 (H) 02/25/2017 1013      Component Value Date/Time   CALCIUM 9.4 12/21/2017 1315   CALCIUM 9.3 09/08/2017 1317   CALCIUM 8.7 02/25/2017 1013   ALKPHOS 46 12/21/2017 1315   ALKPHOS 45 09/08/2017 1317   ALKPHOS 65 02/25/2017 1013   AST 28 12/21/2017 1315   AST 16 02/25/2017 1013   ALT 28 12/21/2017 1315   ALT 26 09/08/2017 1317   ALT 13  02/25/2017 1013   BILITOT 0.9 12/21/2017 1315   BILITOT 0.76 02/25/2017 1013      Impression and Plan: Ms. Kalman is a very pleasant 83 yo caucasian female with immune based thrombocytopenia. Her platelet count today is 135. We will proceed with Nplate injection today.  We will continue to check her labs and give Nplate if needed every 3 weeks.  We will see her back for follow-up in 6 weeks.  She will contact our office with any questions or concerns. We can certainly see her sooner if needed.    Laverna Peace, NP 4/26/201912:15 PM

## 2018-01-04 DIAGNOSIS — J208 Acute bronchitis due to other specified organisms: Secondary | ICD-10-CM | POA: Diagnosis not present

## 2018-01-07 ENCOUNTER — Ambulatory Visit: Payer: Medicare Other | Admitting: Adult Health

## 2018-01-07 ENCOUNTER — Other Ambulatory Visit: Payer: Self-pay | Admitting: *Deleted

## 2018-01-07 ENCOUNTER — Telehealth: Payer: Self-pay | Admitting: *Deleted

## 2018-01-07 DIAGNOSIS — I130 Hypertensive heart and chronic kidney disease with heart failure and stage 1 through stage 4 chronic kidney disease, or unspecified chronic kidney disease: Secondary | ICD-10-CM | POA: Diagnosis not present

## 2018-01-07 DIAGNOSIS — I5033 Acute on chronic diastolic (congestive) heart failure: Secondary | ICD-10-CM | POA: Diagnosis not present

## 2018-01-07 DIAGNOSIS — R0902 Hypoxemia: Secondary | ICD-10-CM | POA: Diagnosis not present

## 2018-01-07 DIAGNOSIS — R0600 Dyspnea, unspecified: Secondary | ICD-10-CM | POA: Diagnosis not present

## 2018-01-07 DIAGNOSIS — D500 Iron deficiency anemia secondary to blood loss (chronic): Secondary | ICD-10-CM

## 2018-01-07 DIAGNOSIS — I509 Heart failure, unspecified: Secondary | ICD-10-CM | POA: Diagnosis not present

## 2018-01-07 DIAGNOSIS — G9341 Metabolic encephalopathy: Secondary | ICD-10-CM | POA: Diagnosis not present

## 2018-01-07 DIAGNOSIS — J189 Pneumonia, unspecified organism: Secondary | ICD-10-CM | POA: Diagnosis not present

## 2018-01-07 DIAGNOSIS — R079 Chest pain, unspecified: Secondary | ICD-10-CM | POA: Diagnosis not present

## 2018-01-07 DIAGNOSIS — N179 Acute kidney failure, unspecified: Secondary | ICD-10-CM | POA: Diagnosis not present

## 2018-01-07 DIAGNOSIS — I248 Other forms of acute ischemic heart disease: Secondary | ICD-10-CM | POA: Diagnosis not present

## 2018-01-07 DIAGNOSIS — I110 Hypertensive heart disease with heart failure: Secondary | ICD-10-CM | POA: Diagnosis not present

## 2018-01-07 DIAGNOSIS — I10 Essential (primary) hypertension: Secondary | ICD-10-CM | POA: Diagnosis not present

## 2018-01-07 NOTE — Telephone Encounter (Signed)
Patient's daughter is calling the office with concerns about her mother. She states she's very lethargic and wants to sleep all the time. She also states that she is coughing and appears sick. She thought that we recently checked iron levels and wanted to know what they were.  Reviewed chart and we have no recent iron studies. Reviewed patient symptoms and it's felt like they may be representative of something other than iron deficiency as patient also has CHF and uncontrolled diabetes. The daughter will take the patient to her PCP for evaluation.  Orders for iron added to her future scheduled appointment, however daughter was also informed that she could make a lab only appointment prior to, if they desired to have the iron levels checked ahead of time. She understood.

## 2018-01-08 DIAGNOSIS — N390 Urinary tract infection, site not specified: Secondary | ICD-10-CM | POA: Diagnosis present

## 2018-01-08 DIAGNOSIS — E1122 Type 2 diabetes mellitus with diabetic chronic kidney disease: Secondary | ICD-10-CM | POA: Diagnosis not present

## 2018-01-08 DIAGNOSIS — G4733 Obstructive sleep apnea (adult) (pediatric): Secondary | ICD-10-CM | POA: Diagnosis present

## 2018-01-08 DIAGNOSIS — Z882 Allergy status to sulfonamides status: Secondary | ICD-10-CM | POA: Diagnosis not present

## 2018-01-08 DIAGNOSIS — K219 Gastro-esophageal reflux disease without esophagitis: Secondary | ICD-10-CM | POA: Diagnosis present

## 2018-01-08 DIAGNOSIS — R079 Chest pain, unspecified: Secondary | ICD-10-CM | POA: Diagnosis not present

## 2018-01-08 DIAGNOSIS — N179 Acute kidney failure, unspecified: Secondary | ICD-10-CM | POA: Diagnosis not present

## 2018-01-08 DIAGNOSIS — Z950 Presence of cardiac pacemaker: Secondary | ICD-10-CM | POA: Diagnosis not present

## 2018-01-08 DIAGNOSIS — B961 Klebsiella pneumoniae [K. pneumoniae] as the cause of diseases classified elsewhere: Secondary | ICD-10-CM | POA: Diagnosis present

## 2018-01-08 DIAGNOSIS — G9341 Metabolic encephalopathy: Secondary | ICD-10-CM | POA: Diagnosis present

## 2018-01-08 DIAGNOSIS — R6511 Systemic inflammatory response syndrome (SIRS) of non-infectious origin with acute organ dysfunction: Secondary | ICD-10-CM | POA: Diagnosis not present

## 2018-01-08 DIAGNOSIS — R7989 Other specified abnormal findings of blood chemistry: Secondary | ICD-10-CM | POA: Diagnosis not present

## 2018-01-08 DIAGNOSIS — J189 Pneumonia, unspecified organism: Secondary | ICD-10-CM | POA: Diagnosis present

## 2018-01-08 DIAGNOSIS — Z6841 Body Mass Index (BMI) 40.0 and over, adult: Secondary | ICD-10-CM | POA: Diagnosis not present

## 2018-01-08 DIAGNOSIS — E039 Hypothyroidism, unspecified: Secondary | ICD-10-CM | POA: Diagnosis not present

## 2018-01-08 DIAGNOSIS — I5033 Acute on chronic diastolic (congestive) heart failure: Secondary | ICD-10-CM | POA: Diagnosis not present

## 2018-01-08 DIAGNOSIS — E1165 Type 2 diabetes mellitus with hyperglycemia: Secondary | ICD-10-CM | POA: Diagnosis not present

## 2018-01-08 DIAGNOSIS — I2510 Atherosclerotic heart disease of native coronary artery without angina pectoris: Secondary | ICD-10-CM | POA: Diagnosis present

## 2018-01-08 DIAGNOSIS — N281 Cyst of kidney, acquired: Secondary | ICD-10-CM | POA: Diagnosis not present

## 2018-01-08 DIAGNOSIS — R2681 Unsteadiness on feet: Secondary | ICD-10-CM | POA: Diagnosis not present

## 2018-01-08 DIAGNOSIS — I110 Hypertensive heart disease with heart failure: Secondary | ICD-10-CM | POA: Diagnosis not present

## 2018-01-08 DIAGNOSIS — I248 Other forms of acute ischemic heart disease: Secondary | ICD-10-CM | POA: Diagnosis present

## 2018-01-08 DIAGNOSIS — R9431 Abnormal electrocardiogram [ECG] [EKG]: Secondary | ICD-10-CM | POA: Diagnosis not present

## 2018-01-08 DIAGNOSIS — R0902 Hypoxemia: Secondary | ICD-10-CM | POA: Diagnosis not present

## 2018-01-08 DIAGNOSIS — M6281 Muscle weakness (generalized): Secondary | ICD-10-CM | POA: Diagnosis not present

## 2018-01-08 DIAGNOSIS — E785 Hyperlipidemia, unspecified: Secondary | ICD-10-CM | POA: Diagnosis not present

## 2018-01-08 DIAGNOSIS — I10 Essential (primary) hypertension: Secondary | ICD-10-CM | POA: Diagnosis not present

## 2018-01-08 DIAGNOSIS — T83511A Infection and inflammatory reaction due to indwelling urethral catheter, initial encounter: Secondary | ICD-10-CM | POA: Diagnosis present

## 2018-01-08 DIAGNOSIS — I4581 Long QT syndrome: Secondary | ICD-10-CM | POA: Diagnosis not present

## 2018-01-08 DIAGNOSIS — I130 Hypertensive heart and chronic kidney disease with heart failure and stage 1 through stage 4 chronic kidney disease, or unspecified chronic kidney disease: Secondary | ICD-10-CM | POA: Diagnosis not present

## 2018-01-08 DIAGNOSIS — R05 Cough: Secondary | ICD-10-CM | POA: Diagnosis not present

## 2018-01-08 DIAGNOSIS — I878 Other specified disorders of veins: Secondary | ICD-10-CM | POA: Diagnosis present

## 2018-01-08 DIAGNOSIS — R6510 Systemic inflammatory response syndrome (SIRS) of non-infectious origin without acute organ dysfunction: Secondary | ICD-10-CM | POA: Diagnosis not present

## 2018-01-08 DIAGNOSIS — N183 Chronic kidney disease, stage 3 (moderate): Secondary | ICD-10-CM | POA: Diagnosis not present

## 2018-01-08 DIAGNOSIS — I509 Heart failure, unspecified: Secondary | ICD-10-CM | POA: Diagnosis not present

## 2018-01-08 DIAGNOSIS — I4891 Unspecified atrial fibrillation: Secondary | ICD-10-CM | POA: Diagnosis not present

## 2018-01-08 DIAGNOSIS — Z794 Long term (current) use of insulin: Secondary | ICD-10-CM | POA: Diagnosis not present

## 2018-01-08 DIAGNOSIS — R278 Other lack of coordination: Secondary | ICD-10-CM | POA: Diagnosis not present

## 2018-01-09 NOTE — Progress Notes (Deleted)
[facility] [address] [place], [place] [address] [phone]  Date:  01/10/2018   Name:  [name]   DOB:  1935-05-21   MRN:  [id number]  PCP:  Dorothyann Peng, NP    Chief Complaint: No chief complaint on file.   History of Present Illness:  [name] is a 83 y.o. very pleasant female patient who presents with the following:  Here today as a new patient to establish care History of CKD4, DM,HTN, hyperlipidemia, OSA, immune mediated thrombocytopenia managed by heme/[facility]  Hem/onc: [facility] Endocrinology: [facility]  Lab Results  Component Value Date   HGBA1C 8.2 11/18/2017     Patient Active Problem List   Diagnosis Date Noted  . IDA (iron deficiency anemia) 05/18/2017  . Thrombocytopenia ([place]) 01/25/2017  . Chronic kidney disease (CKD) stage G4/A1, severely decreased glomerular filtration rate (GFR) between 15-29 mL/min/1.73 square meter and albuminuria creatinine ratio less than 30 mg/g ([facility]) 09/04/2015  . Chronic kidney disease (CKD), stage IV (severe) ([place]) 09/04/2015  . Acute respiratory failure with hypoxia ([place]) 08/24/2015  . Type 2 diabetes mellitus with stage 4 chronic kidney disease, with long-term current use of insulin ([place]) 08/05/2010  . GOUT 06/23/2010  . DERMATITIS 01/16/2010  . BACK PAIN 06/13/2009  . MYALGIA 06/13/2009  . Anxiety state 12/13/2008  . LEG EDEMA, CHRONIC 11/08/2008  . Sleep apnea 10/26/2008  . Hypothyroidism 09/27/2008  . HYPERCHOLESTEROLEMIA 09/27/2008  . OBESITY 09/27/2008  . DEPRESSION 09/27/2008  . Essential hypertension 09/27/2008  . ALLERGIC RHINITIS 09/27/2008  . ASTHMA, CHILDHOOD 09/27/2008    Past Medical History:  Diagnosis Date  . Allergic rhinitis   . Anxiety state, unspecified    panic attacks  . CHF (congestive heart failure) ([place])   . Depressive disorder, not elsewhere classified   . Extrinsic asthma,  unspecified    no problem since adulthood  . Obesity   . OSA on CPAP    severe  . Pure hypercholesterolemia   . Respiratory failure with hypoxia ([place]) 09/2008   acute, secondary to multiple bilateral pulmonary embolism , negative hypercoagulable workup 09/2008 hospital stay  . Scoliosis   . Syncope and collapse   . Type II or unspecified type diabetes mellitus without mention of complication, not stated as uncontrolled   . Unspecified essential hypertension   . Unspecified hypothyroidism    hypo    Past Surgical History:  Procedure Laterality Date  . EYE SURGERY    . JOINT REPLACEMENT    . knee replaced    . THYROID SURGERY      Social History   Tobacco Use  . Smoking status: Former Smoker    Years: 20.00    Last attempt to quit: 09/07/1989    Years since quitting: 28.3  . Smokeless tobacco: Never Used  Substance Use Topics  . Alcohol use: No  . Drug use: No    Family History  Problem Relation Age of Onset  . Allergies Mother   . Clotting disorder Mother   . Osteoarthritis Mother   . Asthma Mother   . Arthritis Other   . Diabetes Other   . Hyperlipidemia Other   . Hypertension Other   . Coronary artery disease Other   . Stroke Other   . Osteoarthritis Daughter   . Rheum arthritis Maternal Grandmother   . Clotting disorder Maternal Grandmother   . Clotting disorder Maternal Uncle   .  Clotting disorder Daughter   . Allergies Daughter   . Breast cancer Neg Hx     Allergies  Allergen Reactions  . Apixaban Other (See Comments)    Internal Bleeding  Patient states internal bleeding.  . Aspirin Itching, Rash, Hives and Swelling    Swelling of her tongue  . Mirabegron Other (See Comments)    Patient experienced A-Fib  . Metformin Diarrhea and Nausea Only  . Pineapple Swelling    Throat swells and blisters on tongue and roof of mouth per patient  . Tetracycline Hives  . Fluticasone-Salmeterol Itching and Rash  . Iodinated Diagnostic Agents Itching and Rash        . Lactose Intolerance (Gi) Other (See Comments)    gas  . Latex Itching, Rash and Other (See Comments)    Pt. States latex pulls her skin off.   . Lisinopril Cough  . Metronidazole Other (See Comments)    Unknown   . Sulfa Antibiotics Rash  . Sulfonamide Derivatives Itching and Rash    Medication list has been reviewed and updated.  Current Outpatient Medications on File Prior to Visit  Medication Sig Dispense Refill  . amlodipine-atorvastatin (CADUET) 2.5-10 MG tablet Take 1 tablet by mouth daily. 1 tab @@ bedtime.    Marland Kitchen atorvastatin (LIPITOR) 40 MG tablet Take 40 mg by mouth daily.    Marland Kitchen BIOTIN PO Take 1 tablet by mouth daily.    . Calcium Carbonate-Vit D-Min (CALCIUM 1200 PO) Take 1 tablet by mouth daily.    . Cholecalciferol (VITAMIN D3) 2000 units TABS Take 2,000 Units by mouth daily.    . Coenzyme Q10 200 MG capsule Take 200 mg by mouth daily.      Marland Kitchen docusate sodium (COLACE) 100 MG capsule Take 1 capsule by mouth daily.    . fenofibrate 54 MG tablet TAKE 1 TABLET(54 MG) BY MOUTH DAILY 90 tablet 1  . fexofenadine (ALLEGRA) 180 MG tablet Take 180 mg by mouth daily.      . furosemide (LASIX) 20 MG tablet Take 2 tablets (40 mg total) by mouth daily as needed. 90 tablet 1  . glipiZIDE (GLUCOTROL) 10 MG tablet Take 1 tablet (10 mg total) by mouth 2 (two) times daily before a meal. 180 tablet 1  . insulin NPH Human (NOVOLIN N RELION) 100 UNIT/ML injection Inject 0.5 mLs (50 Units total) into the skin 2 (two) times daily before a meal. 30 mL 11  . Insulin Pen Needle (B-D UF III MINI PEN NEEDLES) 31G X 5 MM MISC Use daily for insulin injection 100 each 2  . insulin regular (NOVOLIN R RELION) 100 units/mL injection Inject 0.1-0.25 mLs (10-25 Units total) into the skin 3 (three) times daily before meals. 30 mL 11  . Insulin Syringe-Needle U-100 (INSULIN SYRINGE 1CC/30GX5/16") 30G X 5/16" 1 ML MISC USE BID UTD  11  . levothyroxine (SYNTHROID, LEVOTHROID) 112 MCG tablet TAKE 1 TABLET  BY MOUTH DAILY 90 tablet 0  . Multiple Vitamin (MULTIVITAMIN) tablet Take 1 tablet by mouth daily.      . potassium chloride SA (K-DUR,KLOR-CON) 20 MEQ tablet Take 20 mEq by mouth 2 (two) times daily.    . potassium citrate (UROCIT-K) 10 MEQ (1080 MG) SR tablet TK 2 TABLETS PO BID WITH MEALS  4  . Probiotic Product (PROBIOTIC PO) Take 1 capsule by mouth daily.    . ranitidine (ZANTAC) 150 MG tablet Take 150 mg by mouth daily as needed for heartburn.    . sertraline (  ZOLOFT) 50 MG tablet Take 1 tablet (50 mg total) daily by mouth. 90 tablet 1  . vitamin C (ASCORBIC ACID) 250 MG tablet Take 250 mg by mouth daily.     No current facility-administered medications on file prior to visit.     Review of Systems:  As per HPI- otherwise negative.   Physical Examination: There were no vitals filed for this visit. There were no vitals filed for this visit. There is no height or weight on file to calculate BMI. Ideal Body Weight:    GEN: WDWN, NAD, Non-toxic, A & O x 3 HEENT: Atraumatic, Normocephalic. Neck supple. No masses, No LAD. Ears and Nose: No external deformity. CV: RRR, No M/G/R. No JVD. No thrill. No extra heart sounds. PULM: CTA B, no wheezes, crackles, rhonchi. No retractions. No resp. distress. No accessory muscle use. ABD: S, NT, ND, +BS. No rebound. No HSM. EXTR: No c/c/e NEURO Normal gait.  PSYCH: Normally interactive. Conversant. Not depressed or anxious appearing.  Calm demeanor.    Assessment and Plan: ***  Signed Lamar Blinks, MD

## 2018-01-10 ENCOUNTER — Telehealth: Payer: Self-pay | Admitting: Adult Health

## 2018-01-10 ENCOUNTER — Ambulatory Visit: Payer: Medicare Other | Admitting: Family Medicine

## 2018-01-10 NOTE — Telephone Encounter (Signed)
Copied from [place]. Topic: Appointment Scheduling - Scheduling Inquiry for Clinic >> Jan 10, 2018  9:44 AM Rutherford Nail, NT wrote: Reason for CRM: Patient's daughter, [name], calling and states that the patient will not be making her New patient appointment with dr Lorelei Pont today due to being hospitalized Friday 01/07/18. Looked at rescheduling new Patient appointment, but the earliest appointment is 05/12/18. Daughter states that is too far out. Wanted to know if she could be seen sooner. CB#: [phone]

## 2018-01-11 NOTE — Telephone Encounter (Signed)
Yes, it can be sooner.  Schedule at their convenience please

## 2018-01-13 DIAGNOSIS — I2510 Atherosclerotic heart disease of native coronary artery without angina pectoris: Secondary | ICD-10-CM | POA: Insufficient documentation

## 2018-01-13 DIAGNOSIS — B3731 Acute candidiasis of vulva and vagina: Secondary | ICD-10-CM | POA: Insufficient documentation

## 2018-01-13 DIAGNOSIS — N183 Chronic kidney disease, stage 3 (moderate): Secondary | ICD-10-CM | POA: Diagnosis not present

## 2018-01-13 DIAGNOSIS — R2681 Unsteadiness on feet: Secondary | ICD-10-CM | POA: Insufficient documentation

## 2018-01-13 DIAGNOSIS — I5033 Acute on chronic diastolic (congestive) heart failure: Secondary | ICD-10-CM | POA: Diagnosis not present

## 2018-01-13 DIAGNOSIS — R6511 Systemic inflammatory response syndrome (SIRS) of non-infectious origin with acute organ dysfunction: Secondary | ICD-10-CM | POA: Diagnosis not present

## 2018-01-13 DIAGNOSIS — E890 Postprocedural hypothyroidism: Secondary | ICD-10-CM | POA: Insufficient documentation

## 2018-01-13 DIAGNOSIS — N390 Urinary tract infection, site not specified: Secondary | ICD-10-CM | POA: Insufficient documentation

## 2018-01-13 DIAGNOSIS — R278 Other lack of coordination: Secondary | ICD-10-CM | POA: Diagnosis not present

## 2018-01-13 DIAGNOSIS — Z794 Long term (current) use of insulin: Secondary | ICD-10-CM | POA: Diagnosis not present

## 2018-01-13 DIAGNOSIS — I130 Hypertensive heart and chronic kidney disease with heart failure and stage 1 through stage 4 chronic kidney disease, or unspecified chronic kidney disease: Secondary | ICD-10-CM | POA: Insufficient documentation

## 2018-01-13 DIAGNOSIS — D693 Immune thrombocytopenic purpura: Secondary | ICD-10-CM | POA: Diagnosis not present

## 2018-01-13 DIAGNOSIS — I110 Hypertensive heart disease with heart failure: Secondary | ICD-10-CM | POA: Diagnosis not present

## 2018-01-13 DIAGNOSIS — E1165 Type 2 diabetes mellitus with hyperglycemia: Secondary | ICD-10-CM | POA: Diagnosis not present

## 2018-01-13 DIAGNOSIS — E1122 Type 2 diabetes mellitus with diabetic chronic kidney disease: Secondary | ICD-10-CM | POA: Diagnosis not present

## 2018-01-13 DIAGNOSIS — D509 Iron deficiency anemia, unspecified: Secondary | ICD-10-CM | POA: Diagnosis not present

## 2018-01-13 DIAGNOSIS — I509 Heart failure, unspecified: Secondary | ICD-10-CM | POA: Diagnosis not present

## 2018-01-13 DIAGNOSIS — M6281 Muscle weakness (generalized): Secondary | ICD-10-CM | POA: Insufficient documentation

## 2018-01-13 DIAGNOSIS — N184 Chronic kidney disease, stage 4 (severe): Secondary | ICD-10-CM | POA: Diagnosis not present

## 2018-01-13 DIAGNOSIS — T781XXA Other adverse food reactions, not elsewhere classified, initial encounter: Secondary | ICD-10-CM | POA: Insufficient documentation

## 2018-01-13 DIAGNOSIS — J328 Other chronic sinusitis: Secondary | ICD-10-CM | POA: Insufficient documentation

## 2018-01-13 DIAGNOSIS — Z6841 Body Mass Index (BMI) 40.0 and over, adult: Secondary | ICD-10-CM | POA: Diagnosis not present

## 2018-01-13 DIAGNOSIS — J189 Pneumonia, unspecified organism: Secondary | ICD-10-CM | POA: Diagnosis not present

## 2018-01-13 DIAGNOSIS — K029 Dental caries, unspecified: Secondary | ICD-10-CM | POA: Insufficient documentation

## 2018-01-13 DIAGNOSIS — R0902 Hypoxemia: Secondary | ICD-10-CM | POA: Diagnosis not present

## 2018-01-17 ENCOUNTER — Ambulatory Visit (INDEPENDENT_AMBULATORY_CARE_PROVIDER_SITE_OTHER): Payer: Medicare Other | Admitting: Internal Medicine

## 2018-01-17 ENCOUNTER — Encounter: Payer: Self-pay | Admitting: Internal Medicine

## 2018-01-17 VITALS — BP 134/78 | HR 114 | Ht 61.0 in | Wt 249.8 lb

## 2018-01-17 DIAGNOSIS — Z6841 Body Mass Index (BMI) 40.0 and over, adult: Secondary | ICD-10-CM

## 2018-01-17 DIAGNOSIS — N184 Chronic kidney disease, stage 4 (severe): Secondary | ICD-10-CM | POA: Diagnosis not present

## 2018-01-17 DIAGNOSIS — Z794 Long term (current) use of insulin: Secondary | ICD-10-CM | POA: Diagnosis not present

## 2018-01-17 DIAGNOSIS — E1122 Type 2 diabetes mellitus with diabetic chronic kidney disease: Secondary | ICD-10-CM

## 2018-01-17 NOTE — Progress Notes (Signed)
Patient ID: [name], female   DOB: 09-12-1934, 83 y.o.   MRN: [id number]   HPI: [name] is a 83 y.o.-year-old female, returning for f/u for DM2, dx in late 1990s, insulin-dependent since 2012, uncontrolled, with complications (CKD stage 4, CHF, + DR). She prev. Saw Dr. Chalmers Cater. Last visit with me 2 months ago.  She is here with her daughter who offers part of the history especially regarding patient's most recent past medical history and her sugars.  She was recently dx'ed with CHF and a mild AMI >> now in rehab: [facility] >> sugars higher.  Last hemoglobin A1c was: Lab Results  Component Value Date   HGBA1C 8.2 11/18/2017   HGBA1C 7.1 (H) 08/19/2017   HGBA1C 6.2 06/02/2017  08/19/2017: HbA1c calculated from fructosamine is 6.7%  She was on : - Glipizide 10 mg 2x a day before meals - Humulin 70/30: - 70 units after b'fast - 15 units after lunch - 70-75 units after dinner.  At last visit we changed to: - Glipizide 10 mg 2x a day before meals  Insulin Before breakfast Before lunch Before dinner  Regular  25  10   25   NPH  50   50  Please inject the insulin 30 min before meals.  Now, in the facility (!!!): - Glipizide 5 mg before b'fast - Lantus 10 units at night - NovoLog target 200, ISF 25  Pt checks her sugars 2-4X a day - am:  141-206, 253 >> 158-252, 283 >> 295-427 - 2h after b'fast: n/c >> 290 >> n/c - before lunch:  171-188, 219 >> n/c >> 312-390 - 2h after lunch: n/c - before dinner: 131-208, 368 >> 174-250, 284 >> 312-559 - 2h after dinner: n/c - bedtime: n/c >> 369 >> 335-438 - nighttime: n/c Lowest sugar was 60 >> 131 >> 131 >> 295; she has hypoglycemia awareness at 100. Highest sugar was 500s x 1 lately >> 368 >> 369 >> 559.  Glucometer: True Metrix air  Pt's meals are: - Breakfast: grits, bacon, green tea + stevia >> eggs + bacon + toast/grits + fruit - Lunch: 1/2 sandwich + salad + chips and drink >> 1/2 sandwich - snack: fruit -  Dinner:meat + veggies + occas. Starch >> meat + veggies - Snacks: 2 - usually salty  - + CKD, last BUN/creatinine:  Lab Results  Component Value Date   BUN 35 (H) 12/31/2017   BUN 42 (H) 12/21/2017   CREATININE 2.10 (H) 12/31/2017   CREATININE 2.30 (H) 12/21/2017   Lab Results  Component Value Date   GFRNONAA 21 (L) 12/31/2017   GFRNONAA 17 (L) 10/08/2017   GFRNONAA 18 (L) 09/15/2017   GFRNONAA 21 (L) 06/28/2017   GFRNONAA 29 (L) 04/23/2017   GFRNONAA 28 (L) 02/09/2017   GFRNONAA 28 (L) 02/02/2017   GFRNONAA 32 (L) 01/26/2017   GFRNONAA 19 (L) 10/14/2015   GFRNONAA 15 (L) 08/29/2015  He has a h/o uric acid kidney stones.  - + HL. last set of lipids: Lab Results  Component Value Date   CHOL 166 12/15/2016   HDL 30.50 (L) 12/15/2016   LDLCALC 65 08/16/2009   LDLDIRECT 54.0 12/15/2016   TRIG (H) 12/15/2016    513.0 Triglyceride is over 400; calculations on Lipids are invalid.   CHOLHDL 5 12/15/2016  On Lipitor, fenofibrate - last eye exam was in 05/2017: + DR -+ Numbness and tingling in her toes.  She has urinary incontinence >> started Myrbetriq >>  developed A fib >> started Eloquis >> had bleeding >> received blood transfusion.  She also has a history of hypothyroidism, and is on levothyroxine:  Most recent TSH was normal Lab Results  Component Value Date   TSH 1.82 12/15/2016   She has anemia and Thrombocytopenia and is occasionally getting platelet infusions.   ROS: Constitutional: no weight gain/no weight loss, no fatigue, no subjective hyperthermia, no subjective hypothermia Eyes: no blurry vision, no xerophthalmia ENT: no sore throat, no nodules palpated in throat, no dysphagia, no odynophagia, no hoarseness Cardiovascular: no CP/no SOB/no palpitations/+ leg swelling Respiratory: no cough/no SOB/no wheezing Gastrointestinal: no N/no V/no D/no C/no acid reflux Musculoskeletal: no muscle aches/no joint aches Skin: no rashes, no hair loss Neurological: no  tremors/+ numbness/+ tingling/no dizziness  I reviewed pt's medications, allergies, PMH, social hx, family hx, and changes were documented in the history of present illness. Otherwise, unchanged from my initial visit note.  Past Medical History:  Diagnosis Date  . Allergic rhinitis   . Anxiety state, unspecified    panic attacks  . CHF (congestive heart failure) ([place])   . Depressive disorder, not elsewhere classified   . Extrinsic asthma, unspecified    no problem since adulthood  . Obesity   . OSA on CPAP    severe  . Pure hypercholesterolemia   . Respiratory failure with hypoxia ([place]) 09/2008   acute, secondary to multiple bilateral pulmonary embolism , negative hypercoagulable workup 09/2008 hospital stay  . Scoliosis   . Syncope and collapse   . Type II or unspecified type diabetes mellitus without mention of complication, not stated as uncontrolled   . Unspecified essential hypertension   . Unspecified hypothyroidism    hypo   Past Surgical History:  Procedure Laterality Date  . EYE SURGERY    . JOINT REPLACEMENT    . knee replaced    . THYROID SURGERY     Social History   Social History  . Marital status: Widowed    Spouse name: N/A  . Number of children: 2   Occupational History  . 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 Smoking status: Former Smoker    Years: 20.00    Quit date: 09/07/1989  . Smokeless tobacco: Never Used  . Alcohol use No  . Drug use: No   Current Outpatient Medications on File Prior to Visit  Medication Sig Dispense Refill  . amlodipine-atorvastatin (CADUET) 2.5-10 MG tablet Take 1 tablet by mouth daily. 1 tab @@ bedtime.    Marland Kitchen atorvastatin (LIPITOR) 40 MG tablet Take 40 mg by mouth daily.    Marland Kitchen BIOTIN PO Take 1 tablet by mouth daily.    . Calcium Carbonate-Vit D-Min (CALCIUM 1200 PO) Take 1 tablet by mouth daily.    . Cholecalciferol (VITAMIN D3) 2000 units TABS Take 2,000 Units by mouth  daily.    . Coenzyme Q10 200 MG capsule Take 200 mg by mouth daily.      Marland Kitchen docusate sodium (COLACE) 100 MG capsule Take 1 capsule by mouth daily.    . fenofibrate 54 MG tablet TAKE 1 TABLET(54 MG) BY MOUTH DAILY 90 tablet 1  . fexofenadine (ALLEGRA) 180 MG tablet Take 180 mg by mouth daily.      . furosemide (LASIX) 20 MG tablet Take 2 tablets (40 mg total) by mouth daily as needed. 90 tablet 1  . glipiZIDE (GLUCOTROL) 10 MG tablet Take 1 tablet (10  mg total) by mouth 2 (two) times daily before a meal. 180 tablet 1  . insulin NPH Human (NOVOLIN N RELION) 100 UNIT/ML injection Inject 0.5 mLs (50 Units total) into the skin 2 (two) times daily before a meal. 30 mL 11  . Insulin Pen Needle (B-D UF III MINI PEN NEEDLES) 31G X 5 MM MISC Use daily for insulin injection 100 each 2  . insulin regular (NOVOLIN R RELION) 100 units/mL injection Inject 0.1-0.25 mLs (10-25 Units total) into the skin 3 (three) times daily before meals. 30 mL 11  . Insulin Syringe-Needle U-100 (INSULIN SYRINGE 1CC/30GX5/16") 30G X 5/16" 1 ML MISC USE BID UTD  11  . levothyroxine (SYNTHROID, LEVOTHROID) 112 MCG tablet TAKE 1 TABLET BY MOUTH DAILY 90 tablet 0  . Multiple Vitamin (MULTIVITAMIN) tablet Take 1 tablet by mouth daily.      . potassium chloride SA (K-DUR,KLOR-CON) 20 MEQ tablet Take 20 mEq by mouth 2 (two) times daily.    . potassium citrate (UROCIT-K) 10 MEQ (1080 MG) SR tablet TK 2 TABLETS PO BID WITH MEALS  4  . Probiotic Product (PROBIOTIC PO) Take 1 capsule by mouth daily.    . ranitidine (ZANTAC) 150 MG tablet Take 150 mg by mouth daily as needed for heartburn.    . sertraline (ZOLOFT) 50 MG tablet Take 1 tablet (50 mg total) daily by mouth. 90 tablet 1  . vitamin C (ASCORBIC ACID) 250 MG tablet Take 250 mg by mouth daily.     No current facility-administered medications on file prior to visit.    Allergies  Allergen Reactions  . Apixaban Other (See Comments)    Internal Bleeding  Patient states internal  bleeding.  . Aspirin Itching, Rash, Hives and Swelling    Swelling of her tongue  . Mirabegron Other (See Comments)    Patient experienced A-Fib  . Metformin Diarrhea and Nausea Only  . Pineapple Swelling    Throat swells and blisters on tongue and roof of mouth per patient  . Tetracycline Hives  . Fluticasone-Salmeterol Itching and Rash  . Iodinated Diagnostic Agents Itching and Rash       . Lactose Intolerance (Gi) Other (See Comments)    gas  . Latex Itching, Rash and Other (See Comments)    Pt. States latex pulls her skin off.   . Lisinopril Cough  . Metronidazole Other (See Comments)    Unknown   . Sulfa Antibiotics Rash  . Sulfonamide Derivatives Itching and Rash   Family History  Problem Relation Age of Onset  . Allergies Mother   . Clotting disorder Mother   . Osteoarthritis Mother   . Asthma Mother   . Arthritis Other   . Diabetes Other   . Hyperlipidemia Other   . Hypertension Other   . Coronary artery disease Other   . Stroke Other   . Osteoarthritis Daughter   . Rheum arthritis Maternal Grandmother   . Clotting disorder Maternal Grandmother   . Clotting disorder Maternal Uncle   . Clotting disorder Daughter   . Allergies Daughter   . Breast cancer Neg Hx    PE: BP 134/78   Pulse (!) 114   Ht 5\' 1"  (1.549 m)   Wt 249 lb 12.8 oz (113.3 kg)   LMP  (LMP Unknown)   SpO2 90%   BMI 47.20 kg/m  Wt Readings from Last 3 Encounters:  01/17/18 249 lb 12.8 oz (113.3 kg)  12/31/17 255 lb (115.7 kg)  11/19/17 251 lb (  113.9 kg)   Constitutional: overweight, in NAD, + uses a walker Eyes: PERRLA, EOMI, no exophthalmos ENT: moist mucous membranes, no thyromegaly, no cervical lymphadenopathy Cardiovascular: tachycardia RR, No RG, +1/6 SEM, + bilateral leg swelling, pitting Respiratory: CTA B Gastrointestinal: abdomen soft, NT, ND, BS+ Musculoskeletal: no deformities, strength intact in all 4 Skin: moist, warm, no rashes Neurological: no tremor with  outstretched hands, DTR normal in all 4  ASSESSMENT: 1. DM2, insulin-dependent, uncontrolled, with complications - CKD stage 4 - CHF - + DR  2. Obesity class 3  PLAN:  1. Patient with long-standing, uncontrolled, type 2 diabetes, previously on premixed insulin regimen and glipizide, with higher sugars at last visit.  I advised her to change from the premixed insulin to a basal-bolus insulin regimen.  I gave her written instructions about how to mixed insulin and also home many units to take before breakfast lunch and dinner.  I recommended that she get insulin from [facility]. The analog insulins were expensive for her.  She did start this in the sugar started to improve.  However, since then, she developed a UTI, CHF exacerbation, a mild MI (demand ischemia), and she is now in a rehab facility.  While in the facility she is given less than 10% of the amount of insulin that she was getting before being hospitalized.  Therefore, her sugars are from 200-500s. - We discussed about increasing the insulin doses, but per her and her daughter's preference, will stay with Lantus and NovoLog for now - I will advise the facility to send me sugars in 2 weeks - I suggested to:  Patient Instructions  Please increase: - Glipizide to 10 mg 2x a day before meals - Lantus 40 units at bedtime - Novolog  20 units before breakfast 12 units before lunch 16 units before dinner  Please send me the sugar log in 2 weeks.  Please return in 3 months with your sugar log.   - Reviewed previous HbA1c from 11/2017, which was higher, at 8.2%.  We will not recheck it today - continue checking sugars at different times of the day - check 3x a day, rotating checks - advised for yearly eye exams >> she is UTD - Return to clinic in 3 mo with sugar log     2. Obesity class 3 -She is not following a plant-based diet anymore.  They are asking for advice about what to eat and we discussed at length about what her meals need to  be consistent of.  I strongly advised her to cut out fatty foods to reduce her insulin resistance to have a chance to improve her sugars  and also her weight.  Philemon Kingdom, MD PhD [facility] Endocrinology

## 2018-01-17 NOTE — Patient Instructions (Addendum)
Please increase: - Glipizide to 10 mg 2x a day before meals - Lantus 40 units at bedtime - Novolog  20 units before breakfast 12 units before lunch 16 units before dinner  Please send me the sugar log in 2 weeks.  Please return in 3 months with your sugar log.

## 2018-01-18 DIAGNOSIS — N390 Urinary tract infection, site not specified: Secondary | ICD-10-CM | POA: Diagnosis not present

## 2018-01-18 DIAGNOSIS — I509 Heart failure, unspecified: Secondary | ICD-10-CM | POA: Diagnosis not present

## 2018-01-18 DIAGNOSIS — J189 Pneumonia, unspecified organism: Secondary | ICD-10-CM | POA: Diagnosis not present

## 2018-01-18 DIAGNOSIS — M6281 Muscle weakness (generalized): Secondary | ICD-10-CM | POA: Diagnosis not present

## 2018-01-18 DIAGNOSIS — I2510 Atherosclerotic heart disease of native coronary artery without angina pectoris: Secondary | ICD-10-CM | POA: Diagnosis not present

## 2018-01-21 ENCOUNTER — Telehealth: Payer: Self-pay | Admitting: *Deleted

## 2018-01-21 ENCOUNTER — Inpatient Hospital Stay: Payer: No Typology Code available for payment source | Attending: Hematology & Oncology

## 2018-01-21 ENCOUNTER — Inpatient Hospital Stay: Payer: No Typology Code available for payment source

## 2018-01-21 ENCOUNTER — Telehealth: Payer: Self-pay | Admitting: Family

## 2018-01-21 ENCOUNTER — Other Ambulatory Visit: Payer: Self-pay | Admitting: Family

## 2018-01-21 VITALS — BP 128/46 | HR 52 | Temp 97.7°F | Resp 16

## 2018-01-21 DIAGNOSIS — D696 Thrombocytopenia, unspecified: Secondary | ICD-10-CM

## 2018-01-21 DIAGNOSIS — D509 Iron deficiency anemia, unspecified: Secondary | ICD-10-CM | POA: Diagnosis not present

## 2018-01-21 DIAGNOSIS — D693 Immune thrombocytopenic purpura: Secondary | ICD-10-CM | POA: Diagnosis not present

## 2018-01-21 DIAGNOSIS — D500 Iron deficiency anemia secondary to blood loss (chronic): Secondary | ICD-10-CM

## 2018-01-21 LAB — CBC WITH DIFFERENTIAL (CANCER CENTER ONLY)
Basophils Absolute: 0 10*3/uL (ref 0.0–0.1)
Basophils Relative: 0 %
Eosinophils Absolute: 0.1 10*3/uL (ref 0.0–0.5)
Eosinophils Relative: 3 %
HCT: 30.7 % — ABNORMAL LOW (ref 34.8–46.6)
Hemoglobin: 10.1 g/dL — ABNORMAL LOW (ref 11.6–15.9)
Lymphocytes Relative: 16 %
Lymphs Abs: 0.9 10*3/uL (ref 0.9–3.3)
MCH: 31.9 pg (ref 26.0–34.0)
MCHC: 32.9 g/dL (ref 32.0–36.0)
MCV: 96.8 fL (ref 81.0–101.0)
Monocytes Absolute: 0.4 10*3/uL (ref 0.1–0.9)
Monocytes Relative: 7 %
Neutro Abs: 4.2 10*3/uL (ref 1.5–6.5)
Neutrophils Relative %: 74 %
Platelet Count: 195 10*3/uL (ref 145–400)
RBC: 3.17 MIL/uL — ABNORMAL LOW (ref 3.70–5.32)
RDW: 18 % — ABNORMAL HIGH (ref 11.1–15.7)
WBC Count: 5.7 10*3/uL (ref 3.9–10.0)

## 2018-01-21 LAB — CMP (CANCER CENTER ONLY)
ALT: 20 U/L (ref 0–55)
AST: 24 U/L (ref 5–34)
Albumin: 4.1 g/dL (ref 3.5–5.0)
Alkaline Phosphatase: 41 U/L (ref 40–150)
Anion gap: 12 — ABNORMAL HIGH (ref 3–11)
BUN: 57 mg/dL — ABNORMAL HIGH (ref 7–26)
CO2: 30 mmol/L — ABNORMAL HIGH (ref 22–29)
Calcium: 9.7 mg/dL (ref 8.4–10.4)
Chloride: 95 mmol/L — ABNORMAL LOW (ref 98–109)
Creatinine: 3.03 mg/dL (ref 0.60–1.10)
GFR, Est AFR Am: 16 mL/min — ABNORMAL LOW (ref >60)
GFR, Estimated: 14 mL/min — ABNORMAL LOW (ref >60)
Glucose, Bld: 287 mg/dL — ABNORMAL HIGH (ref 70–140)
Potassium: 4.3 mmol/L (ref 3.5–5.1)
Sodium: 137 mmol/L (ref 136–145)
Total Bilirubin: 0.9 mg/dL (ref 0.2–1.2)
Total Protein: 7.7 g/dL (ref 6.4–8.3)

## 2018-01-21 MED ORDER — ROMIPLOSTIM INJECTION 500 MCG
3.9500 ug/kg | Freq: Once | SUBCUTANEOUS | Status: AC
  Administered 2018-01-21: 450 ug via SUBCUTANEOUS
  Filled 2018-01-21: qty 0.9

## 2018-01-21 NOTE — Patient Instructions (Signed)
Romiplostim injection What is this medicine? ROMIPLOSTIM (roe mi PLOE stim) helps your body make more platelets. This medicine is used to treat low platelets caused by chronic idiopathic thrombocytopenic purpura (ITP). This medicine may be used for other purposes; ask your health care provider or pharmacist if you have questions. COMMON BRAND NAME(S): Nplate What should I tell my health care provider before I take this medicine? They need to know if you have any of these conditions: -cancer or myelodysplastic syndrome -low blood counts, like low white cell, platelet, or red cell counts -take medicines that treat or prevent blood clots -an unusual or allergic reaction to romiplostim, mannitol, other medicines, foods, dyes, or preservatives -pregnant or trying to get pregnant -breast-feeding How should I use this medicine? This medicine is for injection under the skin. It is given by a health care professional in a hospital or clinic setting. A special MedGuide will be given to you before your injection. Read this information carefully each time. Talk to your pediatrician regarding the use of this medicine in children. Special care may be needed. Overdosage: If you think you have taken too much of this medicine contact a poison control center or emergency room at once. NOTE: This medicine is only for you. Do not share this medicine with others. What if I miss a dose? It is important not to miss your dose. Call your doctor or health care professional if you are unable to keep an appointment. What may interact with this medicine? Interactions are not expected. This list may not describe all possible interactions. Give your health care provider a list of all the medicines, herbs, non-prescription drugs, or dietary supplements you use. Also tell them if you smoke, drink alcohol, or use illegal drugs. Some items may interact with your medicine. What should I watch for while using this  medicine? Your condition will be monitored carefully while you are receiving this medicine. Visit your prescriber or health care professional for regular checks on your progress and for the needed blood tests. It is important to keep all appointments. What side effects may I notice from receiving this medicine? Side effects that you should report to your doctor or health care professional as soon as possible: -allergic reactions like skin rash, itching or hives, swelling of the face, lips, or tongue -shortness of breath, chest pain, swelling in a leg -unusual bleeding or bruising Side effects that usually do not require medical attention (report to your doctor or health care professional if they continue or are bothersome): -dizziness -headache -muscle aches -pain in arms and legs -stomach pain -trouble sleeping This list may not describe all possible side effects. Call your doctor for medical advice about side effects. You may report side effects to FDA at 1-800-FDA-1088. Where should I keep my medicine? This drug is given in a hospital or clinic and will not be stored at home. NOTE: This sheet is a summary. It may not cover all possible information. If you have questions about this medicine, talk to your doctor, pharmacist, or health care provider.  2018 Elsevier/Gold Standard (2008-04-23 15:13:04)  

## 2018-01-21 NOTE — Telephone Encounter (Signed)
Critical Value Creatinine 3.03 Laverna Peace NP notified. No orders at this time

## 2018-01-21 NOTE — Telephone Encounter (Signed)
I spoke with [name] daughter and let her know about her CMP results with high BUN/Creatinine and low chloride. [name] has been in the hospital with exacerbation of CHF and had a stress MI. She is now on diuretics and they are working on regulating her blood glucose. She would like the results faxed to [facility] at [facility] (where she is currently receiving rehab). I have done that per [name] request and also forwarded her labs to her PCP Dorothyann Peng, NP.

## 2018-01-24 LAB — IRON AND TIBC
Iron: 146 ug/dL — ABNORMAL HIGH (ref 41–142)
Saturation Ratios: 37 % (ref 21–57)
TIBC: 400 ug/dL (ref 236–444)
UIBC: 254 ug/dL

## 2018-01-24 LAB — FERRITIN: Ferritin: 67 ng/mL (ref 9–269)

## 2018-01-25 DIAGNOSIS — I130 Hypertensive heart and chronic kidney disease with heart failure and stage 1 through stage 4 chronic kidney disease, or unspecified chronic kidney disease: Secondary | ICD-10-CM | POA: Diagnosis not present

## 2018-01-25 DIAGNOSIS — N183 Chronic kidney disease, stage 3 (moderate): Secondary | ICD-10-CM | POA: Diagnosis not present

## 2018-01-25 DIAGNOSIS — D638 Anemia in other chronic diseases classified elsewhere: Secondary | ICD-10-CM | POA: Diagnosis not present

## 2018-01-25 DIAGNOSIS — E1122 Type 2 diabetes mellitus with diabetic chronic kidney disease: Secondary | ICD-10-CM | POA: Diagnosis not present

## 2018-01-28 DIAGNOSIS — E1165 Type 2 diabetes mellitus with hyperglycemia: Secondary | ICD-10-CM | POA: Diagnosis not present

## 2018-01-28 DIAGNOSIS — I130 Hypertensive heart and chronic kidney disease with heart failure and stage 1 through stage 4 chronic kidney disease, or unspecified chronic kidney disease: Secondary | ICD-10-CM | POA: Diagnosis not present

## 2018-02-01 DIAGNOSIS — E1122 Type 2 diabetes mellitus with diabetic chronic kidney disease: Secondary | ICD-10-CM | POA: Diagnosis not present

## 2018-02-01 DIAGNOSIS — I5033 Acute on chronic diastolic (congestive) heart failure: Secondary | ICD-10-CM | POA: Diagnosis not present

## 2018-02-01 DIAGNOSIS — N183 Chronic kidney disease, stage 3 (moderate): Secondary | ICD-10-CM | POA: Diagnosis not present

## 2018-02-01 DIAGNOSIS — I130 Hypertensive heart and chronic kidney disease with heart failure and stage 1 through stage 4 chronic kidney disease, or unspecified chronic kidney disease: Secondary | ICD-10-CM | POA: Diagnosis not present

## 2018-02-01 DIAGNOSIS — I4891 Unspecified atrial fibrillation: Secondary | ICD-10-CM | POA: Diagnosis not present

## 2018-02-04 ENCOUNTER — Telehealth: Payer: Self-pay

## 2018-02-04 NOTE — Telephone Encounter (Signed)
Please ask them to increase to 60 units.

## 2018-02-04 NOTE — Telephone Encounter (Signed)
[facility] is aware and order faxed

## 2018-02-04 NOTE — Telephone Encounter (Signed)
[facility], Phone [phone], called because pt was taking 45 units daily of Lantus. Please advise if you wanted the dose raised 25 untis or if it is a different number.

## 2018-02-04 NOTE — Telephone Encounter (Signed)
Called pt after receiving CBG log from facility. Dr. Cruzita Lederer wants her Lantus dose up 'd to 25 units. Sent request to the facility at [phone]. Pt has been made aware of change as well and that we need updated CBG logs in 2 weeks.

## 2018-02-08 DIAGNOSIS — E1122 Type 2 diabetes mellitus with diabetic chronic kidney disease: Secondary | ICD-10-CM | POA: Diagnosis not present

## 2018-02-11 ENCOUNTER — Inpatient Hospital Stay: Payer: Medicare Other

## 2018-02-11 ENCOUNTER — Inpatient Hospital Stay: Payer: Medicare Other | Attending: Hematology & Oncology | Admitting: Family

## 2018-02-11 ENCOUNTER — Other Ambulatory Visit: Payer: Self-pay

## 2018-02-11 VITALS — BP 124/64 | HR 83 | Temp 98.4°F | Resp 18 | Wt 244.0 lb

## 2018-02-11 DIAGNOSIS — D696 Thrombocytopenia, unspecified: Secondary | ICD-10-CM

## 2018-02-11 DIAGNOSIS — Z86711 Personal history of pulmonary embolism: Secondary | ICD-10-CM | POA: Insufficient documentation

## 2018-02-11 DIAGNOSIS — D509 Iron deficiency anemia, unspecified: Secondary | ICD-10-CM

## 2018-02-11 DIAGNOSIS — D500 Iron deficiency anemia secondary to blood loss (chronic): Secondary | ICD-10-CM

## 2018-02-11 DIAGNOSIS — D693 Immune thrombocytopenic purpura: Secondary | ICD-10-CM | POA: Insufficient documentation

## 2018-02-11 LAB — CBC WITH DIFFERENTIAL (CANCER CENTER ONLY)
Basophils Absolute: 0 10*3/uL (ref 0.0–0.1)
Basophils Relative: 0 %
Eosinophils Absolute: 0.2 10*3/uL (ref 0.0–0.5)
Eosinophils Relative: 4 %
HCT: 33.3 % — ABNORMAL LOW (ref 34.8–46.6)
Hemoglobin: 11.1 g/dL — ABNORMAL LOW (ref 11.6–15.9)
Lymphocytes Relative: 21 %
Lymphs Abs: 1.2 10*3/uL (ref 0.9–3.3)
MCH: 32.1 pg (ref 26.0–34.0)
MCHC: 33.3 g/dL (ref 32.0–36.0)
MCV: 96.2 fL (ref 81.0–101.0)
Monocytes Absolute: 0.5 10*3/uL (ref 0.1–0.9)
Monocytes Relative: 8 %
Neutro Abs: 3.9 10*3/uL (ref 1.5–6.5)
Neutrophils Relative %: 67 %
Platelet Count: 192 10*3/uL (ref 145–400)
RBC: 3.46 MIL/uL — ABNORMAL LOW (ref 3.70–5.32)
RDW: 18.7 % — ABNORMAL HIGH (ref 11.1–15.7)
WBC Count: 5.7 10*3/uL (ref 3.9–10.0)

## 2018-02-11 LAB — CMP (CANCER CENTER ONLY)
ALT: 19 U/L (ref 10–47)
AST: 25 U/L (ref 11–38)
Albumin: 4.1 g/dL (ref 3.5–5.0)
Alkaline Phosphatase: 46 U/L (ref 26–84)
Anion gap: 11 (ref 5–15)
BUN: 43 mg/dL — ABNORMAL HIGH (ref 7–22)
CO2: 31 mmol/L (ref 18–33)
Calcium: 9.8 mg/dL (ref 8.0–10.3)
Chloride: 99 mmol/L (ref 98–108)
Creatinine: 2.9 mg/dL — ABNORMAL HIGH (ref 0.60–1.20)
Glucose, Bld: 201 mg/dL — ABNORMAL HIGH (ref 73–118)
Potassium: 4.2 mmol/L (ref 3.3–4.7)
Sodium: 141 mmol/L (ref 128–145)
Total Bilirubin: 0.9 mg/dL (ref 0.2–1.6)
Total Protein: 8 g/dL (ref 6.4–8.1)

## 2018-02-11 MED ORDER — ROMIPLOSTIM INJECTION 500 MCG
450.0000 ug | Freq: Once | SUBCUTANEOUS | Status: AC
Start: 2018-02-11 — End: 2018-02-11
  Administered 2018-02-11: 450 ug via SUBCUTANEOUS
  Filled 2018-02-11: qty 0.9

## 2018-02-11 NOTE — Progress Notes (Signed)
Hematology and Oncology Follow Up Visit  [name] [id number] July 31, 1935 83 y.o. 02/11/2018   Principle Diagnosis:  Immune based thrombocytopenia History of PE Iron deficiency anemia  Current Therapy:   Nplate as indicated IV Iron as indicated - dose given on 07/07/2017   Interim History:  [name] is here today for follow-up. She was hospitalized last month with exacerbation of CHF and a urinary tract infection treated with diuretics, gentle IV fluids and antibiotics. She was then discharged to [facility] [place] facility. She loves it there and has decided to move into an apartment there which should be ready for her in the next few weeks. She states that she feels that best she has felt in a long time.  She feels that her blood sugars are now better controlled. Her weight is down 11 lbs since we last saw her.  She has a good appetite and is still eating healthy. She is hydrating well.  No fever, chills, n/v, cough, rash, dizziness, SOB, chest pain, palpitations, abdominal pain or changes in bowel or bladder habits.  No lymphadenopathy noted on exam. No episodes of bleeding, no bruising or petechiae. Platelet count is stable at 192.  The swelling in her lower extremities seems to be improved. Pedal pulses +2.   ECOG Performance Status: 1 - Symptomatic but completely ambulatory  Medications:  Allergies as of 02/11/2018      Reactions   Apixaban Other (See Comments)   Internal Bleeding Patient states internal bleeding.   Aspirin Itching, Rash, Hives, Swelling   Swelling of her tongue   Mirabegron Other (See Comments)   Patient experienced A-Fib   Metformin Diarrhea, Nausea Only   Pineapple Swelling   Throat swells and blisters on tongue and roof of mouth per patient   Tetracycline Hives   Fluticasone-salmeterol Itching, Rash   Iodinated Diagnostic Agents Itching, Rash      Lactose Intolerance (gi) Other (See Comments)   gas   Latex  Itching, Rash, Other (See Comments)   Pt. States latex pulls her skin off.    Lisinopril Cough   Metronidazole Other (See Comments)   Unknown   Sulfa Antibiotics Rash   Sulfonamide Derivatives Itching, Rash      Medication List        Accurate as of 02/11/18 12:11 PM. Always use your most recent med list.          amlodipine-atorvastatin 2.5-10 MG tablet Commonly known as:  CADUET Take 1 tablet by mouth daily. 1 tab @@ bedtime.   atorvastatin 40 MG tablet Commonly known as:  LIPITOR Take 40 mg by mouth daily.   BIOTIN PO Take 1 tablet by mouth daily.   CALCIUM 1200 PO Take 1 tablet by mouth daily.   Coenzyme Q10 200 MG capsule Take 200 mg by mouth daily.   docusate sodium 100 MG capsule Commonly known as:  COLACE Take 1 capsule by mouth daily.   fenofibrate 54 MG tablet TAKE 1 TABLET(54 MG) BY MOUTH DAILY   fexofenadine 180 MG tablet Commonly known as:  ALLEGRA Take 180 mg by mouth daily.   furosemide 20 MG tablet Commonly known as:  LASIX Take 2 tablets (40 mg total) by mouth daily as needed.   glipiZIDE 10 MG tablet Commonly known as:  GLUCOTROL Take 1 tablet (10 mg total) by mouth 2 (two) times daily before a meal.   insulin NPH Human 100 UNIT/ML injection Commonly known as:  NOVOLIN N RELION Inject  0.5 mLs (50 Units total) into the skin 2 (two) times daily before a meal.   Insulin Pen Needle 31G X 5 MM Misc Commonly known as:  B-D UF III MINI PEN NEEDLES Use daily for insulin injection   insulin regular 100 units/mL injection Commonly known as:  NOVOLIN R RELION Inject 0.1-0.25 mLs (10-25 Units total) into the skin 3 (three) times daily before meals.   INSULIN SYRINGE 1CC/30GX5/16" 30G X 5/16" 1 ML Misc USE BID UTD   levothyroxine 112 MCG tablet Commonly known as:  SYNTHROID, LEVOTHROID TAKE 1 TABLET BY MOUTH DAILY   multivitamin tablet Take 1 tablet by mouth daily.   potassium chloride SA 20 MEQ tablet Commonly known as:   K-DUR,KLOR-CON Take 20 mEq by mouth 2 (two) times daily.   potassium citrate 10 MEQ (1080 MG) SR tablet Commonly known as:  UROCIT-K TK 2 TABLETS PO BID WITH MEALS   PROBIOTIC PO Take 1 capsule by mouth daily.   ranitidine 150 MG tablet Commonly known as:  ZANTAC Take 150 mg by mouth daily as needed for heartburn.   sertraline 50 MG tablet Commonly known as:  ZOLOFT Take 1 tablet (50 mg total) daily by mouth.   vitamin C 250 MG tablet Commonly known as:  ASCORBIC ACID Take 250 mg by mouth daily.   Vitamin D3 2000 units Tabs Take 2,000 Units by mouth daily.       Allergies:  Allergies  Allergen Reactions  . Apixaban Other (See Comments)    Internal Bleeding  Patient states internal bleeding.  . Aspirin Itching, Rash, Hives and Swelling    Swelling of her tongue  . Mirabegron Other (See Comments)    Patient experienced A-Fib  . Metformin Diarrhea and Nausea Only  . Pineapple Swelling    Throat swells and blisters on tongue and roof of mouth per patient  . Tetracycline Hives  . Fluticasone-Salmeterol Itching and Rash  . Iodinated Diagnostic Agents Itching and Rash       . Lactose Intolerance (Gi) Other (See Comments)    gas  . Latex Itching, Rash and Other (See Comments)    Pt. States latex pulls her skin off.   . Lisinopril Cough  . Metronidazole Other (See Comments)    Unknown   . Sulfa Antibiotics Rash  . Sulfonamide Derivatives Itching and Rash    Past Medical History, Surgical history, Social history, and Family History were reviewed and updated.  Review of Systems: All other 10 point review of systems is negative.   Physical Exam:  vitals were not taken for this visit.   Wt Readings from Last 3 Encounters:  01/17/18 249 lb 12.8 oz (113.3 kg)  12/31/17 255 lb (115.7 kg)  11/19/17 251 lb (113.9 kg)    Ocular: Sclerae unicteric, pupils equal, round and reactive to light Ear-nose-throat: Oropharynx clear, dentition fair Lymphatic: No cervical,  supraclavicular or axillary adenopathy Lungs no rales or rhonchi, good excursion bilaterally Heart regular rate and rhythm, no murmur appreciated Abd soft, nontender, positive bowel sounds, no liver or spleen tip palpated on exam, no fluid wave  MSK no focal spinal tenderness, no joint edema Neuro: non-focal, well-oriented, appropriate affect Breasts: Deferred   Lab Results  Component Value Date   WBC 5.7 01/21/2018   HGB 10.1 (L) 01/21/2018   HCT 30.7 (L) 01/21/2018   MCV 96.8 01/21/2018   PLT 195 01/21/2018   Lab Results  Component Value Date   FERRITIN 67 01/21/2018   IRON 146 (H)  01/21/2018   TIBC 400 01/21/2018   UIBC 254 01/21/2018   IRONPCTSAT 37 01/21/2018   Lab Results  Component Value Date   RETICCTPCT 1.9 01/28/2011   RBC 3.17 (L) 01/21/2018   RETICCTABS 73.3 01/28/2011   No results found for: KPAFRELGTCHN, LAMBDASER, KAPLAMBRATIO No results found for: Kandis Cocking, IGMSERUM No results found for: Odetta Pink, SPEI   Chemistry      Component Value Date/Time   NA 137 01/21/2018 1130   NA 145 09/08/2017 1317   NA 142 02/25/2017 1013   K 4.3 01/21/2018 1130   K 4.5 09/08/2017 1317   K 3.8 02/25/2017 1013   CL 95 (L) 01/21/2018 1130   CL 98 09/08/2017 1317   CO2 30 (H) 01/21/2018 1130   CO2 32 09/08/2017 1317   CO2 26 02/25/2017 1013   BUN 57 (H) 01/21/2018 1130   BUN 34 (H) 09/08/2017 1317   BUN 20.1 02/25/2017 1013   CREATININE 3.03 (HH) 01/21/2018 1130   CREATININE 1.9 (H) 09/08/2017 1317   CREATININE 1.8 (H) 02/25/2017 1013      Component Value Date/Time   CALCIUM 9.7 01/21/2018 1130   CALCIUM 9.3 09/08/2017 1317   CALCIUM 8.7 02/25/2017 1013   ALKPHOS 41 01/21/2018 1130   ALKPHOS 45 09/08/2017 1317   ALKPHOS 65 02/25/2017 1013   AST 24 01/21/2018 1130   AST 16 02/25/2017 1013   ALT 20 01/21/2018 1130   ALT 26 09/08/2017 1317   ALT 13 02/25/2017 1013   BILITOT 0.9 01/21/2018 1130    BILITOT 0.76 02/25/2017 1013      Impression and Plan: MS. Lazarus is a very pleasant 83 yo caucasian female with immune based thrombocytopenia. She has responded nicely to Nplate and platelet count is now 192.  She received her Nplate today as planned.  We will plan to see her monthly now for lab and injection with follow-up in 2 months.  They will contact our office with any questions or concerns. We can certainly see her sooner if need be.   Laverna Peace, NP 6/7/201912:11 PM

## 2018-02-15 DIAGNOSIS — Z0189 Encounter for other specified special examinations: Secondary | ICD-10-CM | POA: Diagnosis not present

## 2018-02-16 ENCOUNTER — Encounter: Payer: Self-pay | Admitting: Pulmonary Disease

## 2018-02-16 NOTE — Telephone Encounter (Signed)
Follow up call made regarding patient. Per daughter patient was admitted to care facility [facility] where he will be staying and will be followed by provider at that facility. Marland Kitchen

## 2018-02-21 DIAGNOSIS — G4733 Obstructive sleep apnea (adult) (pediatric): Secondary | ICD-10-CM | POA: Diagnosis not present

## 2018-02-25 ENCOUNTER — Ambulatory Visit: Payer: Medicare Other

## 2018-02-25 ENCOUNTER — Other Ambulatory Visit: Payer: Medicare Other

## 2018-02-28 ENCOUNTER — Telehealth: Payer: Self-pay | Admitting: Pulmonary Disease

## 2018-02-28 ENCOUNTER — Telehealth: Payer: Self-pay

## 2018-02-28 DIAGNOSIS — N183 Chronic kidney disease, stage 3 (moderate): Secondary | ICD-10-CM | POA: Diagnosis not present

## 2018-02-28 DIAGNOSIS — I5033 Acute on chronic diastolic (congestive) heart failure: Secondary | ICD-10-CM | POA: Diagnosis not present

## 2018-02-28 NOTE — Telephone Encounter (Signed)
I have faxed over new orders to [facility] that Dr. Loanne Drilling advised in Dr. Arman Filter absence.

## 2018-02-28 NOTE — Telephone Encounter (Signed)
Please take: Lantus 50 units at bedtime - Novolog  25 units before breakfast 15 units before lunch 20 units before dinner Please call or message [place] next week, to tell us how the blood sugar is doing

## 2018-02-28 NOTE — Telephone Encounter (Signed)
Left detailed message for her to call back.  Last office visit was in 2014 and she is to see Dr Elsworth Soho in 04/2018.  There was no recent sleep study in Epic.  Wanted to see if she wanted to schedule  appointment with one of our NP's, so she can be seen before August.

## 2018-02-28 NOTE — Telephone Encounter (Signed)
Melissa from [facility] calling in about patients blood sugar reading which she faxed over last friday her readings for the weekend are: Friday- 323 am             301 midday             282 pm             320 hs Sat- 281 am         290 pm         324 hs Sun- 305 am          316 mid          355 pm          307 hs  Mon- 333 am  Please fax any new orders to [phone] Please advise in Dr. Ena Dawley absence

## 2018-03-01 NOTE — Telephone Encounter (Signed)
Pt returned call. Pt sched a follow up apt with [facility] for 6/26.

## 2018-03-01 NOTE — Progress Notes (Signed)
@Patient  ID: [name], female    DOB: 09/10/1934, 83 y.o.   MRN: [id number]  Chief Complaint  Patient presents with  . Follow-up    Needs new Cpap machine.     Referring provider: No ref. provider found  HPI:   Recent [facility] Encounters:   HPI 77/F with BL idiopathic PE 1/10 for FU of severe obstructive sleep apnea & hypoxia  On CPAP 10 cm PE occurred while on Pro-gon B (hormone supplement, sub lingual)  DIagnostic PSG in 8/09 showed severe obstructive sleep apnea with AHI 85/h, TST 121 mins - no REM sleep. Subsequent titration study showed CPAP requirement of 15 cm with small comfort gel mask Arville Go). However she felt that this pressure was too high & was not very compliant with CPAP.  After PE, oximetry showed desaturation to 77% & oxygen 2 L  was started [facility]). Desaturation (without CPAP) could be on the basis of PE or obstructive sleep apnea .  3/10>> autoCPAP 5-15 with 3 L O2 blended in  03/05/09 >> Reviewed ONO >> desatn less than 88% for 35 mins, sustained during ? REM sleep, ct O2 with CPAP during sleep  reviewed ONO 10/22/10 >> OK to come off O2 at night, ct to use CPAP   Tests:  DIagnostic PSG in 8/09 showed severe obstructive sleep apnea with AHI 85/h, TST 121 mins - no REM sleep. Subsequent titration study showed CPAP requirement of 15 cm with small comfort gel mask  02/16/2018-sleep study report- AHI 26 >>> Results to be scanned into our system >>> [facility]  Imaging:   Cardiac:   Labs:   Micro:   Chart Review:    03/02/18 OV  Pleasant 83 year old patient seen in office today for follow-up for obstructive sleep apnea and CPAP use.  Patient reporting that her CPAP is over 26 years old and also has residues of potentially black mold in the tubing.  Patient is attempted multiple ways to try to clean it and is not removing the buildup of debris.  Patient presents today requesting new CPAP.  Of note patient does bring new sleep study  report this shows an AHI of 26 which is a significant change from her study she had performed in 2019 showing an AHI of 85.  CPAP compliance report over the last 90 days shows 21 to 90 days used.  Only 10 of those days being greater than 4 hours.  Average usage 3 hours and 51 minutes.  AHI 4.6.  Patient reports that she has not wanted to use it recently and she fears that the mold will get her sick.  Patient reports that she is currently in [facility] facility, but planning on moving to the assisted living setting there soon.  Patient reports that she is doing well healthwise otherwise.  Patient knows she needs to follow-up with Dr. Elsworth Soho.  Patient scheduled with Dr. Elsworth Soho on 04/11/2018.  Allergies  Allergen Reactions  . Apixaban Other (See Comments)    Internal Bleeding  Patient states internal bleeding.  . Aspirin Itching, Rash, Hives and Swelling    Swelling of her tongue  . Mirabegron Other (See Comments)    Patient experienced A-Fib  . Metformin Diarrhea and Nausea Only  . Pineapple Swelling    Throat swells and blisters on tongue and roof of mouth per patient  . Tetracycline Hives  . Fluticasone-Salmeterol Itching and Rash  . Iodinated Diagnostic Agents Itching and Rash       .  Lactose Intolerance (Gi) Other (See Comments)    gas  . Latex Itching, Rash and Other (See Comments)    Pt. States latex pulls her skin off.   . Lisinopril Cough  . Metronidazole Other (See Comments)    Unknown   . Sulfa Antibiotics Rash  . Sulfonamide Derivatives Itching and Rash    Immunization History  Administered Date(s) Administered  . Influenza Split 06/09/2011  . Influenza Whole 07/23/2008, 06/13/2009, 06/07/2012  . Influenza, High Dose Seasonal PF 05/08/2015  . Pneumococcal Conjugate-13 12/15/2016  . Pneumococcal Polysaccharide-23 01/30/2008  . Pneumococcal-Unspecified 09/07/2013  . Td 01/25/2006  . Zoster 06/09/2011    Past Medical History:  Diagnosis Date  . Allergic rhinitis     . Anxiety state, unspecified    panic attacks  . CHF (congestive heart failure) ([place])   . Depressive disorder, not elsewhere classified   . Extrinsic asthma, unspecified    no problem since adulthood  . Obesity   . OSA on CPAP    severe  . Pure hypercholesterolemia   . Respiratory failure with hypoxia ([place]) 09/2008   acute, secondary to multiple bilateral pulmonary embolism , negative hypercoagulable workup 09/2008 hospital stay  . Scoliosis   . Syncope and collapse   . Type II or unspecified type diabetes mellitus without mention of complication, not stated as uncontrolled   . Unspecified essential hypertension   . Unspecified hypothyroidism    hypo    Tobacco History: Social History   Tobacco Use  Smoking Status Former Smoker  . Years: 20.00  . Last attempt to quit: 09/07/1989  . Years since quitting: 28.5  Smokeless Tobacco Never Used   Counseling given: Not Answered Continue not smoking.   Outpatient Encounter Medications as of 03/02/2018  Medication Sig  . amlodipine-atorvastatin (CADUET) 2.5-10 MG tablet Take 1 tablet by mouth daily. 1 tab @@ bedtime.  Marland Kitchen atorvastatin (LIPITOR) 40 MG tablet Take 40 mg by mouth daily.  Marland Kitchen BIOTIN PO Take 1 tablet by mouth daily.  . Calcium Carbonate-Vit D-Min (CALCIUM 1200 PO) Take 1 tablet by mouth daily.  . Cholecalciferol (VITAMIN D3) 2000 units TABS Take 2,000 Units by mouth daily.  . Coenzyme Q10 200 MG capsule Take 200 mg by mouth daily.    Marland Kitchen docusate sodium (COLACE) 100 MG capsule Take 1 capsule by mouth daily.  . fenofibrate 54 MG tablet TAKE 1 TABLET(54 MG) BY MOUTH DAILY  . fexofenadine (ALLEGRA) 180 MG tablet Take 180 mg by mouth daily.    . furosemide (LASIX) 20 MG tablet Take 2 tablets (40 mg total) by mouth daily as needed.  Marland Kitchen glipiZIDE (GLUCOTROL) 10 MG tablet Take 1 tablet (10 mg total) by mouth 2 (two) times daily before a meal.  . glipiZIDE (GLUCOTROL) 5 MG tablet Take 2 tablets (10 mg total) by mouth 2 (two) times daily  before a meal.  . insulin NPH Human (NOVOLIN N RELION) 100 UNIT/ML injection Inject 0.5 mLs (50 Units total) into the skin 2 (two) times daily before a meal.  . Insulin Pen Needle (B-D UF III MINI PEN NEEDLES) 31G X 5 MM MISC Use daily for insulin injection  . insulin regular (NOVOLIN R RELION) 100 units/mL injection Inject 0.1-0.25 mLs (10-25 Units total) into the skin 3 (three) times daily before meals.  . Insulin Syringe-Needle U-100 (INSULIN SYRINGE 1CC/30GX5/16") 30G X 5/16" 1 ML MISC USE BID UTD  . levothyroxine (SYNTHROID, LEVOTHROID) 112 MCG tablet TAKE 1 TABLET BY MOUTH DAILY  . Multiple Vitamin (  MULTIVITAMIN) tablet Take 1 tablet by mouth daily.    . potassium chloride SA (K-DUR,KLOR-CON) 20 MEQ tablet Take 20 mEq by mouth 2 (two) times daily.  . potassium citrate (UROCIT-K) 10 MEQ (1080 MG) SR tablet TK 2 TABLETS PO BID WITH MEALS  . Probiotic Product (PROBIOTIC PO) Take 1 capsule by mouth daily.  . ranitidine (ZANTAC) 150 MG tablet Take 150 mg by mouth daily as needed for heartburn.  . sertraline (ZOLOFT) 50 MG tablet Take 1 tablet (50 mg total) daily by mouth.  . vitamin C (ASCORBIC ACID) 250 MG tablet Take 250 mg by mouth daily.   No facility-administered encounter medications on file as of 03/02/2018.      Review of Systems  Constitutional: +fatigue, tired at times  No  weight loss, night sweats,  fevers, chills HEENT:   No headaches,  Difficulty swallowing,  Tooth/dental problems, or  Sore throat, No sneezing, itching, ear ache, nasal congestion, post nasal drip  CV: No chest pain,  orthopnea, PND, swelling in lower extremities, anasarca, dizziness, palpitations, syncope  GI: No heartburn, indigestion, abdominal pain, nausea, vomiting, diarrhea, change in bowel habits, loss of appetite, bloody stools Resp: No shortness of breath with exertion or at rest.  No excess mucus, no productive cough,  No non-productive cough,  No coughing up of blood.  No change in color of mucus.  No  wheezing.  No chest wall deformity Skin: no rash, lesions, no skin changes. GU: no dysuria, change in color of urine, no urgency or frequency.  No flank pain, no hematuria  MS:  No joint pain or swelling.  No decreased range of motion.  No back pain. Psych:  No change in mood or affect. No depression or anxiety.  No memory loss.   Physical Exam  BP 118/72   Pulse 83   Ht 5\' 1"  (1.549 m)   Wt 246 lb 12.8 oz (111.9 kg)   LMP  (LMP Unknown)   SpO2 97%   BMI 46.63 kg/m   Wt Readings from Last 3 Encounters:  03/02/18 246 lb 12.8 oz (111.9 kg)  02/11/18 244 lb (110.7 kg)  01/17/18 249 lb 12.8 oz (113.3 kg)     GEN: A/Ox3; pleasant , NAD, well nourished, on RA    HEENT:  [place]/AT,  EACs-clear, TMs-wnl, NOSE-clear, THROAT-clear, mallampati II,  no lesions, no postnasal drip or exudate noted.   NECK:  Supple w/ fair ROM; no JVD;  no lymphadenopathy.    RESP:  Clear  P & A; w/o, wheezes/ rales/ or rhonchi. no accessory muscle use, no dullness to percussion  CARD:  RRR, no m/r/g, no peripheral edema, pulses intact, no cyanosis or clubbing.  GI:   Soft & nt; nml bowel sounds; no organomegaly or masses detected.   Musco: Warm bil, no deformities or joint swelling noted.   Neuro: alert, no focal deficits noted.    Skin: Warm, no lesions or rashes    Lab Results:  CBC    Component Value Date/Time   WBC 5.7 02/11/2018 1156   WBC 5.3 09/15/2017 1401   RBC 3.46 (L) 02/11/2018 1156   HGB 11.1 (L) 02/11/2018 1156   HGB 11.0 (L) 09/08/2017 1317   HCT 33.3 (L) 02/11/2018 1156   HCT 33.2 (L) 09/08/2017 1317   PLT 192 02/11/2018 1156   PLT 75 (L) 09/08/2017 1317   PLT 93 (LL) 01/12/2017 1450   MCV 96.2 02/11/2018 1156   MCV 99 09/08/2017 1317   MCH 32.1  02/11/2018 1156   MCHC 33.3 02/11/2018 1156   RDW 18.7 (H) 02/11/2018 1156   RDW 17.1 (H) 09/08/2017 1317   LYMPHSABS 1.2 02/11/2018 1156   LYMPHSABS 1.2 09/08/2017 1317   MONOABS 0.5 02/11/2018 1156   EOSABS 0.2 02/11/2018  1156   EOSABS 0.3 09/08/2017 1317   BASOSABS 0.0 02/11/2018 1156   BASOSABS 0.0 09/08/2017 1317    BMET    Component Value Date/Time   NA 141 02/11/2018 1156   NA 145 09/08/2017 1317   NA 142 02/25/2017 1013   K 4.2 02/11/2018 1156   K 4.5 09/08/2017 1317   K 3.8 02/25/2017 1013   CL 99 02/11/2018 1156   CL 98 09/08/2017 1317   CO2 31 02/11/2018 1156   CO2 32 09/08/2017 1317   CO2 26 02/25/2017 1013   GLUCOSE 201 (H) 02/11/2018 1156   GLUCOSE 251 (H) 09/08/2017 1317   BUN 43 (H) 02/11/2018 1156   BUN 34 (H) 09/08/2017 1317   BUN 20.1 02/25/2017 1013   CREATININE 2.90 (H) 02/11/2018 1156   CREATININE 1.9 (H) 09/08/2017 1317   CREATININE 1.8 (H) 02/25/2017 1013   CALCIUM 9.8 02/11/2018 1156   CALCIUM 9.3 09/08/2017 1317   CALCIUM 8.7 02/25/2017 1013   GFRNONAA 14 (L) 01/21/2018 1130   GFRAA 16 (L) 01/21/2018 1130    BNP    Component Value Date/Time   BNP 151.4 (H) 08/26/2015 0444    ProBNP    Component Value Date/Time   PROBNP 62.1 06/23/2010 2011    Imaging: No results found.   Assessment & Plan:   Pleasant patient seen in office today.  Patient has not been seen office since 2014.  Patient to continue with follow-up on 04/11/2018 to see Dr. Elsworth Soho.  Will reorder CPAP at this time the patient can resume compliance due to suspected mold in her current CPAP.  [facility]  Will do 1 month download to check compliance.  Sleep apnea Placed new order for CPAP today >>> DME: [facility]  >>>we will do 1 month download to check compliance  >>> Please let us know if you are having any other issues or concerns with your CPAP  Keep your follow-up appointment with Dr. Elsworth Soho on 04/11/2018   . Keep up the hard work using your device.  . Do not drive or operate heavy machinery if tired or drowsy.  . Please notify the supply company and office if you are unable to use your device regularly due to missing supplies or machine being broken.    . Work on maintaining a healthy weight and following your recommended nutrition plan  . Maintain proper daily exercise and movement  . Maintaining proper use of your device can also help improve management of other chronic illnesses such as: Blood pressure, blood sugars, and weight management.   CPAP Cleaning:  Clean weekly, with Dawn soap, and bottle brush.  Set up to air dry.   OBESITY Continue to work towards healthy weight     Lauraine Rinne, NP 03/02/2018

## 2018-03-01 NOTE — Telephone Encounter (Signed)
Ok noted  

## 2018-03-02 ENCOUNTER — Encounter: Payer: Self-pay | Admitting: Pulmonary Disease

## 2018-03-02 ENCOUNTER — Ambulatory Visit (INDEPENDENT_AMBULATORY_CARE_PROVIDER_SITE_OTHER): Payer: Medicare Other | Admitting: Pulmonary Disease

## 2018-03-02 VITALS — BP 118/72 | HR 83 | Ht 61.0 in | Wt 246.8 lb

## 2018-03-02 DIAGNOSIS — G4730 Sleep apnea, unspecified: Secondary | ICD-10-CM | POA: Diagnosis not present

## 2018-03-02 NOTE — Patient Instructions (Signed)
Placed new order for CPAP today >>> D[facility]  >>>we will do 1 month download to check compliance  >>> Please let us know if you are having any other issues or concerns with your CPAP  Keep your follow-up appointment with Dr. Elsworth Soho on 04/11/2018   . Keep up the hard work using your device.  . Do not drive or operate heavy machinery if tired or drowsy.  . Please notify the supply company and office if you are unable to use your device regularly due to missing supplies or machine being broken.  . Work on maintaining a healthy weight and following your recommended nutrition plan  . Maintain proper daily exercise and movement  . Maintaining proper use of your device can also help improve management of other chronic illnesses such as: Blood pressure, blood sugars, and weight management.   CPAP Cleaning:  Clean weekly, with Dawn soap, and bottle brush.  Set up to air dry.   Please contact the office if your symptoms worsen or you have concerns that you are not improving.   Thank you for choosing [facility] for your healthcare, and for allowing [place] to partner with you on your healthcare journey. I am thankful to be able to provide care to you today.   Wyn Quaker FNP-C

## 2018-03-02 NOTE — Assessment & Plan Note (Signed)
Continue to work towards healthy weight 

## 2018-03-02 NOTE — Assessment & Plan Note (Signed)
Placed new order for CPAP today >>> D[facility]  >>>we will do 1 month download to check compliance  >>> Please let us know if you are having any other issues or concerns with your CPAP  Keep your follow-up appointment with Dr. Elsworth Soho on 04/11/2018   . Keep up the hard work using your device.  . Do not drive or operate heavy machinery if tired or drowsy.  . Please notify the supply company and office if you are unable to use your device regularly due to missing supplies or machine being broken.  . Work on maintaining a healthy weight and following your recommended nutrition plan  . Maintain proper daily exercise and movement  . Maintaining proper use of your device can also help improve management of other chronic illnesses such as: Blood pressure, blood sugars, and weight management.   CPAP Cleaning:  Clean weekly, with Dawn soap, and bottle brush.  Set up to air dry.

## 2018-03-04 ENCOUNTER — Telehealth: Payer: Self-pay

## 2018-03-04 NOTE — Telephone Encounter (Signed)
Please advise on below   I do not have her logs.

## 2018-03-04 NOTE — Telephone Encounter (Signed)
I got the log in my mailbox, just now got to it. They sent me the table log, which I cannot really interpret, but the sugars are all high. Let's do this: Patient Instructions  Continue: - Glipizide 10 mg 2x a day before meals  Increase: - Lantus 40 >> 50 units at bedtime - Novolog  20 >> 24 units before breakfast 12 >> 16 units before lunch 16  >> 20 units before dinner  Please send me the sugar log in 1 week.

## 2018-03-04 NOTE — Telephone Encounter (Signed)
Faxed

## 2018-03-04 NOTE — Telephone Encounter (Signed)
Melissa with [employer] is calling in regards to patients blood sugars, She stated that she faxed them over yesterday and patient is still having high sugars today  15 mins before her meal this AM sugar was 372 Mid day was 388   Phone - [phone]

## 2018-03-05 DIAGNOSIS — N183 Chronic kidney disease, stage 3 (moderate): Secondary | ICD-10-CM | POA: Diagnosis not present

## 2018-03-05 DIAGNOSIS — I130 Hypertensive heart and chronic kidney disease with heart failure and stage 1 through stage 4 chronic kidney disease, or unspecified chronic kidney disease: Secondary | ICD-10-CM | POA: Diagnosis not present

## 2018-03-05 DIAGNOSIS — I5033 Acute on chronic diastolic (congestive) heart failure: Secondary | ICD-10-CM | POA: Diagnosis not present

## 2018-03-11 ENCOUNTER — Inpatient Hospital Stay: Payer: Medicare Other | Attending: Hematology & Oncology

## 2018-03-11 ENCOUNTER — Other Ambulatory Visit: Payer: Self-pay

## 2018-03-11 ENCOUNTER — Inpatient Hospital Stay: Payer: Medicare Other

## 2018-03-11 VITALS — BP 120/57 | HR 96 | Temp 98.3°F | Resp 18

## 2018-03-11 DIAGNOSIS — D696 Thrombocytopenia, unspecified: Secondary | ICD-10-CM

## 2018-03-11 DIAGNOSIS — D693 Immune thrombocytopenic purpura: Secondary | ICD-10-CM | POA: Diagnosis not present

## 2018-03-11 DIAGNOSIS — D500 Iron deficiency anemia secondary to blood loss (chronic): Secondary | ICD-10-CM

## 2018-03-11 LAB — CBC WITH DIFFERENTIAL (CANCER CENTER ONLY)
Basophils Absolute: 0 10*3/uL (ref 0.0–0.1)
Basophils Relative: 0 %
Eosinophils Absolute: 0.2 10*3/uL (ref 0.0–0.5)
Eosinophils Relative: 3 %
HCT: 31.9 % — ABNORMAL LOW (ref 34.8–46.6)
Hemoglobin: 10.5 g/dL — ABNORMAL LOW (ref 11.6–15.9)
Lymphocytes Relative: 21 %
Lymphs Abs: 1.1 10*3/uL (ref 0.9–3.3)
MCH: 31.6 pg (ref 26.0–34.0)
MCHC: 32.9 g/dL (ref 32.0–36.0)
MCV: 96.1 fL (ref 81.0–101.0)
Monocytes Absolute: 0.4 10*3/uL (ref 0.1–0.9)
Monocytes Relative: 8 %
Neutro Abs: 3.6 10*3/uL (ref 1.5–6.5)
Neutrophils Relative %: 68 %
Platelet Count: 95 10*3/uL — ABNORMAL LOW (ref 145–400)
RBC: 3.32 MIL/uL — ABNORMAL LOW (ref 3.70–5.32)
RDW: 18.5 % — ABNORMAL HIGH (ref 11.1–15.7)
WBC Count: 5.2 10*3/uL (ref 3.9–10.0)

## 2018-03-11 LAB — CMP (CANCER CENTER ONLY)
ALT: 25 U/L (ref 10–47)
AST: 27 U/L (ref 11–38)
Albumin: 3.9 g/dL (ref 3.5–5.0)
Alkaline Phosphatase: 41 U/L (ref 26–84)
Anion gap: 15 (ref 5–15)
BUN: 41 mg/dL — ABNORMAL HIGH (ref 7–22)
CO2: 30 mmol/L (ref 18–33)
Calcium: 9.5 mg/dL (ref 8.0–10.3)
Chloride: 97 mmol/L — ABNORMAL LOW (ref 98–108)
Creatinine: 2.7 mg/dL — ABNORMAL HIGH (ref 0.60–1.20)
Glucose, Bld: 236 mg/dL — ABNORMAL HIGH (ref 73–118)
Potassium: 4.7 mmol/L (ref 3.3–4.7)
Sodium: 142 mmol/L (ref 128–145)
Total Bilirubin: 0.8 mg/dL (ref 0.2–1.6)
Total Protein: 7.8 g/dL (ref 6.4–8.1)

## 2018-03-11 MED ORDER — ROMIPLOSTIM INJECTION 500 MCG
4.0000 ug/kg | Freq: Once | SUBCUTANEOUS | Status: AC
  Administered 2018-03-11: 450 ug via SUBCUTANEOUS
  Filled 2018-03-11: qty 0.9

## 2018-03-11 NOTE — Patient Instructions (Signed)
Romiplostim injection (NPLATE) What is this medicine? ROMIPLOSTIM (roe mi PLOE stim) helps your body make more platelets. This medicine is used to treat low platelets caused by chronic idiopathic thrombocytopenic purpura (ITP). This medicine may be used for other purposes; ask your health care provider or pharmacist if you have questions. COMMON BRAND NAME(S): Nplate What should I tell my health care provider before I take this medicine? They need to know if you have any of these conditions: -cancer or myelodysplastic syndrome -low blood counts, like low white cell, platelet, or red cell counts -take medicines that treat or prevent blood clots -an unusual or allergic reaction to romiplostim, mannitol, other medicines, foods, dyes, or preservatives -pregnant or trying to get pregnant -breast-feeding How should I use this medicine? This medicine is for injection under the skin. It is given by a health care professional in a hospital or clinic setting. A special MedGuide will be given to you before your injection. Read this information carefully each time. Talk to your pediatrician regarding the use of this medicine in children. Special care may be needed. Overdosage: If you think you have taken too much of this medicine contact a poison control center or emergency room at once. NOTE: This medicine is only for you. Do not share this medicine with others. What if I miss a dose? It is important not to miss your dose. Call your doctor or health care professional if you are unable to keep an appointment. What may interact with this medicine? Interactions are not expected. This list may not describe all possible interactions. Give your health care provider a list of all the medicines, herbs, non-prescription drugs, or dietary supplements you use. Also tell them if you smoke, drink alcohol, or use illegal drugs. Some items may interact with your medicine. What should I watch for while using this  medicine? Your condition will be monitored carefully while you are receiving this medicine. Visit your prescriber or health care professional for regular checks on your progress and for the needed blood tests. It is important to keep all appointments. What side effects may I notice from receiving this medicine? Side effects that you should report to your doctor or health care professional as soon as possible: -allergic reactions like skin rash, itching or hives, swelling of the face, lips, or tongue -shortness of breath, chest pain, swelling in a leg -unusual bleeding or bruising Side effects that usually do not require medical attention (report to your doctor or health care professional if they continue or are bothersome): -dizziness -headache -muscle aches -pain in arms and legs -stomach pain -trouble sleeping This list may not describe all possible side effects. Call your doctor for medical advice about side effects. You may report side effects to FDA at 1-800-FDA-1088. Where should I keep my medicine? This drug is given in a hospital or clinic and will not be stored at home. NOTE: This sheet is a summary. It may not cover all possible information. If you have questions about this medicine, talk to your doctor, pharmacist, or health care provider.  2018 Elsevier/Gold Standard (2008-04-23 15:13:04)

## 2018-03-15 DIAGNOSIS — E875 Hyperkalemia: Secondary | ICD-10-CM | POA: Diagnosis not present

## 2018-03-15 DIAGNOSIS — R531 Weakness: Secondary | ICD-10-CM | POA: Diagnosis not present

## 2018-03-18 ENCOUNTER — Telehealth: Payer: Self-pay | Admitting: Internal Medicine

## 2018-03-18 NOTE — Telephone Encounter (Signed)
Claudette Laws from [facility] called re: Patient's blood sugars are running high and would a sooner Office Visit than 9/12. Please call Merleen Nicely at ph# [phone] to advise (phone dropped)

## 2018-03-18 NOTE — Telephone Encounter (Signed)
10 AM on July 16?

## 2018-03-18 NOTE — Telephone Encounter (Signed)
Appt moved, they stated they would call back if they had any issues

## 2018-03-18 NOTE — Telephone Encounter (Signed)
Please advise on below  

## 2018-03-22 ENCOUNTER — Ambulatory Visit (INDEPENDENT_AMBULATORY_CARE_PROVIDER_SITE_OTHER): Payer: Medicare Other | Admitting: Internal Medicine

## 2018-03-22 ENCOUNTER — Encounter: Payer: Self-pay | Admitting: Internal Medicine

## 2018-03-22 VITALS — BP 140/80 | HR 101 | Ht 61.0 in | Wt 251.2 lb

## 2018-03-22 DIAGNOSIS — E7800 Pure hypercholesterolemia, unspecified: Secondary | ICD-10-CM

## 2018-03-22 DIAGNOSIS — Z794 Long term (current) use of insulin: Secondary | ICD-10-CM

## 2018-03-22 DIAGNOSIS — Z6841 Body Mass Index (BMI) 40.0 and over, adult: Secondary | ICD-10-CM

## 2018-03-22 DIAGNOSIS — E039 Hypothyroidism, unspecified: Secondary | ICD-10-CM

## 2018-03-22 DIAGNOSIS — N184 Chronic kidney disease, stage 4 (severe): Secondary | ICD-10-CM

## 2018-03-22 DIAGNOSIS — E1122 Type 2 diabetes mellitus with diabetic chronic kidney disease: Secondary | ICD-10-CM

## 2018-03-22 LAB — POCT GLYCOSYLATED HEMOGLOBIN (HGB A1C): Hemoglobin A1C: 8.3 % — AB (ref 4.0–5.6)

## 2018-03-22 MED ORDER — INSULIN ASPART 100 UNIT/ML ~~LOC~~ SOLN: 25.0000 [IU] | Freq: Three times a day (TID) | SUBCUTANEOUS | 3 refills | Status: DC

## 2018-03-22 MED ORDER — INSULIN GLARGINE 100 UNIT/ML ~~LOC~~ SOLN: 70.0000 [IU] | Freq: Every day | SUBCUTANEOUS | 3 refills | Status: DC

## 2018-03-22 NOTE — Addendum Note (Signed)
Addended by: Drucilla Schmidt on: 03/22/2018 04:02 PM   Modules accepted: Orders

## 2018-03-22 NOTE — Patient Instructions (Addendum)
Please continue: - Glipizide 10 mg 2x a day before meals  Please increase: - Lantus to 70 units at bedtime - Novolog  30 units before breakfast 25 units before lunch 30 units before dinner  Please send me the sugar log in 2 weeks.  Stop Biotin.  Please continue Levothyroxine 112 mcg daily.  Take the thyroid hormone every day, with water, at least 30 minutes before breakfast, separated by at least 4 hours from: - acid reflux medications - calcium - iron - multivitamins  Please return in 3 months with your sugar log.

## 2018-03-22 NOTE — Progress Notes (Signed)
Patient ID: [name], female   DOB: [birth year], 83 y.o.   MRN: [id number]   HPI: [name] is a 83 y.o.-year-old female, returning for f/u for DM2, dx in late 1990s, insulin-dependent since 2012, uncontrolled, with complications (CKD stage 4, CHF, + DR). She prev. Saw Dr. Chalmers Cater. Last visit with me 2 months ago.  She is here with her daughter who offers part of the history especially regarding patient's insulin doses, past medical history, and her sugars.  Before last visit, she was diagnosed with CHF and had a mild MI.  She was in rehab: [facility].  Sugars were much higher. Since then, the facility faxed me her sugars and they continue to stay very high despite significantly increasing her insulin doses.  Last hemoglobin A1c was: Lab Results  Component Value Date   HGBA1C 8.2 11/18/2017   HGBA1C 7.1 (H) 08/19/2017   HGBA1C 6.2 06/02/2017  08/19/2017: HbA1c calculated from fructosamine is 6.7%  She was on: - Glipizide 10 mg 2x a day before meals  Insulin Before breakfast Before lunch Before dinner  Regular  25  10   25   NPH  50   50  Please inject the insulin 30 min before meals.  At last visit, she was on -in the facility (!!!): - Glipizide 5 mg before b'fast - Lantus 10 units at night - NovoLog target 200, ISF 25  At this visit, she is on: - Glipizide 10 mg 2x a day before meals - Lantus 50 units at bedtime - Novolog  24 units before breakfast 16 units before lunch 20 units before dinner  Pt checks her sugars 2-4X a day: - am:  141-206, 253 >> 158-252, 283 >> 295-427 >> 99, 262-343 - 2h after b'fast: n/c >> 290 >> n/c - before lunch:  171-188, 219 >> n/c >> 312-390 >> 269-362 - 2h after lunch: n/c - before dinner:  174-250, 284 >> 312-559 >> 207-364, 431 - 2h after dinner: n/c - bedtime: n/c >> 369 >> 335-438 >> 207-349, 391 - nighttime: n/c Lowest sugar was 295 >> 99 x1; she has hypoglycemia awareness at 100. Highest sugar was 559 >>  431.  Glucometer: True Metrix air  Pt's meals are: - Breakfast: grits, bacon, green tea + stevia >> eggs + bacon + toast/grits + fruit - Lunch: 1/2 sandwich + salad + chips and drink >> 1/2 sandwich - snack: fruit - Dinner:meat + veggies + occas. Starch >> meat + veggies - Snacks: 2 - usually salty  -Stage IV CKD, last BUN/creatinine:  03/15/2018: 47/2.34, GFR 19, Glu 283 Lab Results  Component Value Date   BUN 41 (H) 03/11/2018   BUN 43 (H) 02/11/2018   CREATININE 2.70 (H) 03/11/2018   CREATININE 2.90 (H) 02/11/2018   Lab Results  Component Value Date   GFRNONAA 14 (L) 01/21/2018   GFRNONAA 21 (L) 12/31/2017   GFRNONAA 17 (L) 10/08/2017   GFRNONAA 18 (L) 09/15/2017   GFRNONAA 21 (L) 06/28/2017   GFRNONAA 29 (L) 04/23/2017   GFRNONAA 28 (L) 02/09/2017   GFRNONAA 28 (L) 02/02/2017   GFRNONAA 32 (L) 01/26/2017   GFRNONAA 19 (L) 10/14/2015  He has a h/o uric acid kidney stones.  -+ HL. last set of lipids: Lab Results  Component Value Date   CHOL 166 12/15/2016   HDL 30.50 (L) 12/15/2016   LDLCALC 65 08/16/2009   LDLDIRECT 54.0 12/15/2016   TRIG (H) 12/15/2016    513.0 Triglyceride is over 400; calculations  on Lipids are invalid.   CHOLHDL 5 12/15/2016  On Lipitor 40, fenofibrate 54. - last eye exam was in 05/2017: + DR -+ Numbness and tingling in her toes  She has urinary incontinence >> started Myrbetriq >> developed A fib >> started Eloquis >> had bleeding >> received blood transfusion.  She has a history of hypothyroidism, on levothyroxine 112 mcg:  Most recent TSH was normal last year: Lab Results  Component Value Date   TSH 1.82 12/15/2016   On BIotin 10,000 mcg daily.  She is given levothyroxine at the start of breakfast.  She has anemia and Thrombocytopenia and is occasionally getting platelet infusions.   ROS: Constitutional: no weight gain/no weight loss, no fatigue, no subjective hyperthermia, no subjective hypothermia Eyes: no blurry vision, no  xerophthalmia ENT: no sore throat, no nodules palpated in throat, no dysphagia, no odynophagia, no hoarseness Cardiovascular: no CP/no SOB/no palpitations/no leg swelling Respiratory: no cough/no SOB/no wheezing Gastrointestinal: no N/no V/no D/no C/no acid reflux Musculoskeletal: no muscle aches/no joint aches Skin: no rashes, no hair loss Neurological: no tremors/+ numbness/+ tingling/no dizziness  I reviewed pt's medications, allergies, PMH, social hx, family hx, and changes were documented in the history of present illness. Otherwise, unchanged from my initial visit note.  Past Medical History:  Diagnosis Date  . Allergic rhinitis   . Anxiety state, unspecified    panic attacks  . CHF (congestive heart failure) ([place])   . Depressive disorder, not elsewhere classified   . Extrinsic asthma, unspecified    no problem since adulthood  . Obesity   . OSA on CPAP    severe  . Pure hypercholesterolemia   . Respiratory failure with hypoxia ([place]) 09/2008   acute, secondary to multiple bilateral pulmonary embolism , negative hypercoagulable workup 09/2008 hospital stay  . Scoliosis   . Syncope and collapse   . Type II or unspecified type diabetes mellitus without mention of complication, not stated as uncontrolled   . Unspecified essential hypertension   . Unspecified hypothyroidism    hypo   Past Surgical History:  Procedure Laterality Date  . EYE SURGERY    . JOINT REPLACEMENT    . knee replaced    . THYROID SURGERY     Social History   Social History  . Marital status: Widowed    Spouse name: N/A  . Number of children: 2   Occupational History  . 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 Smoking status: Former Smoker    Years: 20.00    Quit date: 09/07/1989  . Smokeless tobacco: Never Used  . Alcohol use No  . Drug use: No   Current Outpatient Medications on File Prior to Visit  Medication Sig Dispense Refill  .  amlodipine-atorvastatin (CADUET) 2.5-10 MG tablet Take 1 tablet by mouth daily. 1 tab @@ bedtime.    Marland Kitchen atorvastatin (LIPITOR) 40 MG tablet Take 40 mg by mouth daily.    Marland Kitchen BIOTIN PO Take 1 tablet by mouth daily.    . Calcium Carbonate-Vit D-Min (CALCIUM 1200 PO) Take 1 tablet by mouth daily.    . Cholecalciferol (VITAMIN D3) 2000 units TABS Take 2,000 Units by mouth daily.    . Coenzyme Q10 200 MG capsule Take 200 mg by mouth daily.      Marland Kitchen docusate sodium (COLACE) 100 MG capsule Take 1 capsule by mouth daily.    . fenofibrate 54 MG tablet TAKE 1  TABLET(54 MG) BY MOUTH DAILY 90 tablet 1  . fexofenadine (ALLEGRA) 180 MG tablet Take 180 mg by mouth daily.      . furosemide (LASIX) 20 MG tablet Take 2 tablets (40 mg total) by mouth daily as needed. 90 tablet 1  . glipiZIDE (GLUCOTROL) 10 MG tablet Take 1 tablet (10 mg total) by mouth 2 (two) times daily before a meal. 180 tablet 1  . glipiZIDE (GLUCOTROL) 5 MG tablet Take 2 tablets (10 mg total) by mouth 2 (two) times daily before a meal. 360 tablet 0  . insulin NPH Human (NOVOLIN N RELION) 100 UNIT/ML injection Inject 0.5 mLs (50 Units total) into the skin 2 (two) times daily before a meal. 30 mL 11  . Insulin Pen Needle (B-D UF III MINI PEN NEEDLES) 31G X 5 MM MISC Use daily for insulin injection 100 each 2  . insulin regular (NOVOLIN R RELION) 100 units/mL injection Inject 0.1-0.25 mLs (10-25 Units total) into the skin 3 (three) times daily before meals. 30 mL 11  . Insulin Syringe-Needle U-100 (INSULIN SYRINGE 1CC/30GX5/16") 30G X 5/16" 1 ML MISC USE BID UTD  11  . levothyroxine (SYNTHROID, LEVOTHROID) 112 MCG tablet TAKE 1 TABLET BY MOUTH DAILY 90 tablet 0  . Multiple Vitamin (MULTIVITAMIN) tablet Take 1 tablet by mouth daily.      . potassium chloride SA (K-DUR,KLOR-CON) 20 MEQ tablet Take 20 mEq by mouth 2 (two) times daily.    . potassium citrate (UROCIT-K) 10 MEQ (1080 MG) SR tablet TK 2 TABLETS PO BID WITH MEALS  4  . Probiotic Product  (PROBIOTIC PO) Take 1 capsule by mouth daily.    . ranitidine (ZANTAC) 150 MG tablet Take 150 mg by mouth daily as needed for heartburn.    . sertraline (ZOLOFT) 50 MG tablet Take 1 tablet (50 mg total) daily by mouth. 90 tablet 1  . vitamin C (ASCORBIC ACID) 250 MG tablet Take 250 mg by mouth daily.     No current facility-administered medications on file prior to visit.    Allergies  Allergen Reactions  . Apixaban Other (See Comments)    Internal Bleeding  Patient states internal bleeding.  . Aspirin Itching, Rash, Hives and Swelling    Swelling of her tongue  . Mirabegron Other (See Comments)    Patient experienced A-Fib  . Metformin Diarrhea and Nausea Only  . Pineapple Swelling    Throat swells and blisters on tongue and roof of mouth per patient  . Tetracycline Hives  . Fluticasone-Salmeterol Itching and Rash  . Iodinated Diagnostic Agents Itching and Rash       . Lactose Intolerance (Gi) Other (See Comments)    gas  . Latex Itching, Rash and Other (See Comments)    Pt. States latex pulls her skin off.   . Lisinopril Cough  . Metronidazole Other (See Comments)    Unknown   . Sulfa Antibiotics Rash  . Sulfonamide Derivatives Itching and Rash   Family History  Problem Relation Age of Onset  . Allergies Mother   . Clotting disorder Mother   . Osteoarthritis Mother   . Asthma Mother   . Arthritis Other   . Diabetes Other   . Hyperlipidemia Other   . Hypertension Other   . Coronary artery disease Other   . Stroke Other   . Osteoarthritis Daughter   . Rheum arthritis Maternal Grandmother   . Clotting disorder Maternal Grandmother   . Clotting disorder Maternal Uncle   .  Clotting disorder Daughter   . Allergies Daughter   . Breast cancer Neg Hx    PE: BP 140/80   Pulse (!) 101   Ht 5\' 1"  (1.549 m)   Wt 251 lb 3.2 oz (113.9 kg)   LMP  (LMP Unknown)   SpO2 95%   BMI 47.46 kg/m  Wt Readings from Last 3 Encounters:  03/22/18 251 lb 3.2 oz (113.9 kg)   03/02/18 246 lb 12.8 oz (111.9 kg)  02/11/18 244 lb (110.7 kg)   Constitutional: overweight, in NAD, uses a walker Eyes: PERRLA, EOMI, no exophthalmos ENT: moist mucous membranes, no thyromegaly, no cervical lymphadenopathy Cardiovascular: Tachycardia,  RR, No RG, +1/6 SEM, + bilateral leg swelling, pitting Respiratory: CTA B Gastrointestinal: abdomen soft, NT, ND, BS+ Musculoskeletal: no deformities, strength intact in all 4 Skin: moist, warm, no rashes Neurological: no tremor with outstretched hands, DTR normal in all 4   ASSESSMENT: 1. DM2, insulin-dependent, uncontrolled, with complications - CKD stage 4 - CHF - + DR  2. Obesity class 3  PLAN:  1. Patient with long-standing, uncontrolled, type 2 diabetes, previously on premixed insulin regimen and glipizide, with much higher sugars at last visit while in [facility].  Since then we increased her insulin doses and also her glipizide significantly, without improvement in her sugars. - At this visit, sugars are uniformly high >> we discussed about increasing doses of Lantus and Humalog.  Also, I gave her a list of suppliers for the Freestyle libre CGM as she mentions that different fingerstick checks give her different readings, sometimes the difference is as high as 100 mg/dL.  I think she would benefit from the CGM.  I explained how this works. - I will advise the facility to send me sugars in 2 weeks - I suggested to:  Patient Instructions  Please continue: - Glipizide 10 mg 2x a day before meals  Please increase: - Lantus to 70 units at bedtime - Novolog  30 units before breakfast 25 units before lunch 30 units before dinner  Please send me the sugar log in 2 weeks.  Stop Biotin.  Please continue Levothyroxine 112 mcg daily.  Take the thyroid hormone every day, with water, at least 30 minutes before breakfast, separated by at least 4 hours from: - acid reflux medications - calcium - iron -  multivitamins  Please return in 3 months with your sugar log.  - today, HbA1c is 8.3% (slightly higher) - continue checking sugars at different times of the day - check 3x a day, rotating checks - advised for yearly eye exams >> she is UTD - Return to clinic in 3 mo with sugar log     2. Obesity class 3 - She was previously on a plant-based diet.  We discussed at length in the past about how to continue this and also to cut out fatty foods to reduce her insulin resistance. - At this visit, her weight is slightly higher than before  3. HL - Reviewed latest lipid panel from 12/2016: LDL at goal, but triglycerides very high. Lab Results  Component Value Date   CHOL 166 12/15/2016   HDL 30.50 (L) 12/15/2016   LDLCALC 65 08/16/2009   LDLDIRECT 54.0 12/15/2016   TRIG (H) 12/15/2016    513.0 Triglyceride is over 400; calculations on Lipids are invalid.   CHOLHDL 5 12/15/2016  - Continues the statin without side effects. - She is due for another lipid panel - at next visit  4.  Hypothyroidism  - continues on levothyroxine 100 mcg daily - Last TSH was normal, but this was last year  - since she is on biotin, we cannot check her level today >> advised her to stop biotin.  She is not sure why this was started in the first place. - I also advised h the facility to start giving her levothyroxine 30 minutes before breakfast, rather than at the start of breakfast.  This is paramount for good control of her thyroid disease. -We will check TFTs at next visit.  Philemon Kingdom, MD PhD [facility]

## 2018-03-24 ENCOUNTER — Ambulatory Visit: Payer: Medicare Other

## 2018-03-24 DIAGNOSIS — R040 Epistaxis: Secondary | ICD-10-CM

## 2018-03-24 DIAGNOSIS — L0390 Cellulitis, unspecified: Secondary | ICD-10-CM | POA: Insufficient documentation

## 2018-03-24 DIAGNOSIS — D693 Immune thrombocytopenic purpura: Secondary | ICD-10-CM | POA: Insufficient documentation

## 2018-03-24 DIAGNOSIS — R0782 Intercostal pain: Secondary | ICD-10-CM | POA: Insufficient documentation

## 2018-03-24 DIAGNOSIS — R197 Diarrhea, unspecified: Secondary | ICD-10-CM | POA: Insufficient documentation

## 2018-03-24 DIAGNOSIS — R112 Nausea with vomiting, unspecified: Secondary | ICD-10-CM | POA: Insufficient documentation

## 2018-03-24 DIAGNOSIS — S80829A Blister (nonthermal), unspecified lower leg, initial encounter: Secondary | ICD-10-CM | POA: Insufficient documentation

## 2018-03-24 DIAGNOSIS — F330 Major depressive disorder, recurrent, mild: Secondary | ICD-10-CM | POA: Insufficient documentation

## 2018-03-24 HISTORY — DX: Epistaxis: R04.0

## 2018-03-25 ENCOUNTER — Ambulatory Visit: Payer: Medicare Other

## 2018-03-29 NOTE — Progress Notes (Deleted)
Subjective:   [name] is a 83 y.o. female who presents for Medicare Annual (Subsequent) preventive examination.  Reports health as  Diet  Exercise  Health Maintenance Due  Topic Date Due  . TETANUS/TDAP  01/26/2016  . URINE MICROALBUMIN  09/10/2016  . FOOT EXAM  12/15/2017   Dexa 05/2017  -1.6       Objective:     Vitals: LMP  (LMP Unknown)   There is no height or weight on file to calculate BMI.  Advanced Directives 03/11/2018 12/31/2017 11/19/2017 11/05/2017 10/22/2017 10/08/2017 09/29/2017  Does Patient Have a Medical Advance Directive? Yes Yes Yes Yes Yes Yes Yes  Type of Paramedic of [place];Living will [place];Living will - [place];Living will [place];Living will Living will;Healthcare Power of [place];Living will  Does patient want to make changes to medical advance directive? No - Patient declined - - - - No - Patient declined -  Copy of [place] in Chart? No - copy requested - - - - No - copy requested -  Would patient like information on creating a medical advance directive? No - Patient declined - - - - - -    Tobacco Social History   Tobacco Use  Smoking Status Former Smoker  . Years: 20.00  . Last attempt to quit: 09/07/1989  . Years since quitting: 28.5  Smokeless Tobacco Never Used     Counseling given: Not Answered   Clinical Intake:     Past Medical History:  Diagnosis Date  . Allergic rhinitis   . Anxiety state, unspecified    panic attacks  . CHF (congestive heart failure) ([place])   . Depressive disorder, not elsewhere classified   . Extrinsic asthma, unspecified    no problem since adulthood  . Obesity   . OSA on CPAP    severe  . Pure hypercholesterolemia   . Respiratory failure with hypoxia ([place]) 09/2008   acute, secondary to multiple bilateral pulmonary embolism , negative hypercoagulable workup  09/2008 hospital stay  . Scoliosis   . Syncope and collapse   . Type II or unspecified type diabetes mellitus without mention of complication, not stated as uncontrolled   . Unspecified essential hypertension   . Unspecified hypothyroidism    hypo   Past Surgical History:  Procedure Laterality Date  . EYE SURGERY    . JOINT REPLACEMENT    . knee replaced    . THYROID SURGERY     Family History  Problem Relation Age of Onset  . Allergies Mother   . Clotting disorder Mother   . Osteoarthritis Mother   . Asthma Mother   . Arthritis Other   . Diabetes Other   . Hyperlipidemia Other   . Hypertension Other   . Coronary artery disease Other   . Stroke Other   . Osteoarthritis Daughter   . Rheum arthritis Maternal Grandmother   . Clotting disorder Maternal Grandmother   . Clotting disorder Maternal Uncle   . Clotting disorder Daughter   . Allergies Daughter   . Breast cancer Neg Hx    Social History   Socioeconomic History  . Marital status: Widowed    Spouse name: Not on file  . Number of children: 2  . Years of education: Not on file  . Highest education level: Not on file  Occupational History  . 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 Financial resource strain: Not on file  . Food insecurity:    Worry: Not on file    Inability: Not on file  . Transportation needs:    Medical: Not on file    Non-medical: Not on file  Tobacco Use  . Smoking status: Former Smoker    Years: 20.00    Last attempt to quit: 09/07/1989    Years since quitting: 28.5  . Smokeless tobacco: Never Used  Substance and Sexual Activity  . Alcohol use: No  . Drug use: No  . Sexual activity: Not on file  Lifestyle  . Physical activity:    Days per week: Not on file    Minutes per session: Not on file  . Stress: Not on file  Relationships  . Social connections:    Talks on phone: Not on file    Gets together: Not on  file    Attends religious service: Not on file    Active member of club or organization: Not on file    Attends meetings of clubs or organizations: Not on file    Relationship status: Not on file  Other Topics Concern  . Not on file  Social History Narrative  . Not on file    Outpatient Encounter Medications as of 03/30/2018  Medication Sig  . amlodipine-atorvastatin (CADUET) 2.5-10 MG tablet Take 1 tablet by mouth daily. 1 tab @@ bedtime.  Marland Kitchen atorvastatin (LIPITOR) 40 MG tablet Take 40 mg by mouth daily.  . Calcium Carbonate-Vit D-Min (CALCIUM 1200 PO) Take 1 tablet by mouth daily.  . Cholecalciferol (VITAMIN D3) 2000 units TABS Take 2,000 Units by mouth daily.  . Coenzyme Q10 200 MG capsule Take 200 mg by mouth daily.    Marland Kitchen docusate sodium (COLACE) 100 MG capsule Take 1 capsule by mouth daily.  . fenofibrate 54 MG tablet TAKE 1 TABLET(54 MG) BY MOUTH DAILY  . fexofenadine (ALLEGRA) 180 MG tablet Take 180 mg by mouth daily.    . furosemide (LASIX) 20 MG tablet Take 2 tablets (40 mg total) by mouth daily as needed.  Marland Kitchen glipiZIDE (GLUCOTROL) 10 MG tablet Take 1 tablet (10 mg total) by mouth 2 (two) times daily before a meal.  . insulin aspart (NOVOLOG) 100 UNIT/ML injection Inject 25-30 Units into the skin 3 (three) times daily before meals.  . insulin glargine (LANTUS) 100 UNIT/ML injection Inject 0.7 mLs (70 Units total) into the skin daily.  . Insulin Pen Needle (B-D UF III MINI PEN NEEDLES) 31G X 5 MM MISC Use daily for insulin injection  . Insulin Syringe-Needle U-100 (INSULIN SYRINGE 1CC/30GX5/16") 30G X 5/16" 1 ML MISC USE BID UTD  . levothyroxine (SYNTHROID, LEVOTHROID) 112 MCG tablet TAKE 1 TABLET BY MOUTH DAILY  . Multiple Vitamin (MULTIVITAMIN) tablet Take 1 tablet by mouth daily.    . potassium chloride SA (K-DUR,KLOR-CON) 20 MEQ tablet Take 20 mEq by mouth 2 (two) times daily.  . potassium citrate (UROCIT-K) 10 MEQ (1080 MG) SR tablet TK 2 TABLETS PO BID WITH MEALS  . Probiotic  Product (PROBIOTIC PO) Take 1 capsule by mouth daily.  . ranitidine (ZANTAC) 150 MG tablet Take 150 mg by mouth daily as needed for heartburn.  . sertraline (ZOLOFT) 50 MG tablet Take 1 tablet (50 mg total) daily by mouth.  . vitamin C (ASCORBIC ACID) 250 MG tablet Take 250 mg by mouth daily.   No facility-administered encounter medications on file  as of 03/30/2018.     Activities of Daily Living In your present state of health, do you have any difficulty performing the following activities: 06/28/2017  Hearing? N  Vision? N  Difficulty concentrating or making decisions? N  Walking or climbing stairs? Y  Dressing or bathing? Y  Some recent data might be hidden    No care team member to display    Assessment:   This is a routine wellness examination for Serai.  Exercise Activities and Dietary recommendations    Goals    . Decrease the likelihood of falling    . Exercise 150 minutes per week (moderate activity)       Fall Risk Fall Risk  06/16/2017 06/09/2017 06/09/2017 03/23/2017 01/24/2016  Falls in the past year? No No - Yes Yes  Number falls in past yr: - - - 1 2 or more  Injury with Fall? - - - No No  Risk Factor Category  - - - - High Fall Risk  Risk for fall due to : Impaired balance/gait;Impaired mobility Impaired mobility Other (Comment) Impaired balance/gait;Impaired mobility -  Risk for fall due to: Comment - - Instructed to use walker at all times. - -  Follow up - - - Falls prevention discussed;Education provided Falls prevention discussed     Depression Screen PHQ 2/9 Scores 03/23/2017 01/24/2016 04/03/2015  PHQ - 2 Score 1 1 5   PHQ- 9 Score - - 15     Cognitive Function   Ad8 score reviewed for issues:  Issues making decisions:  Less interest in hobbies / activities:  Repeats questions, stories (family complaining):  Trouble using ordinary gadgets (microwave, computer, phone):  Forgets the month or year:   Mismanaging finances:   Remembering  appts:  Daily problems with thinking and/or memory: Ad8 score is=          Immunization History  Administered Date(s) Administered  . Influenza Split 06/09/2011  . Influenza Whole 07/23/2008, 06/13/2009, 06/07/2012  . Influenza, High Dose Seasonal PF 05/08/2015  . Pneumococcal Conjugate-13 12/15/2016  . Pneumococcal Polysaccharide-23 01/30/2008  . Pneumococcal-Unspecified 09/07/2013  . Td 01/25/2006  . Zoster 06/09/2011    Qualifies for Shingles Vaccine?***  Screening Tests Health Maintenance  Topic Date Due  . TETANUS/TDAP  01/26/2016  . URINE MICROALBUMIN  09/10/2016  . FOOT EXAM  12/15/2017  . INFLUENZA VACCINE  06/24/2019 (Originally 04/07/2018)  . OPHTHALMOLOGY EXAM  05/31/2018  . HEMOGLOBIN A1C  09/22/2018  . DEXA SCAN  Completed  . PNA vac Low Risk Adult  Completed         Plan:      PCP Notes ***  Health Maintenance ***  Abnormal Screens  ***  Referrals  ***  Patient concerns; ***  Nurse Concerns; ***  Next PCP apt ***      I have personally reviewed and noted the following in the patient's chart:   . Medical and social history . Use of alcohol, tobacco or illicit drugs  . Current medications and supplements . Functional ability and status . Nutritional status . Physical activity . Advanced directives . List of other physicians . Hospitalizations, surgeries, and ER visits in previous 12 months . Vitals . Screenings to include cognitive, depression, and falls . Referrals and appointments  In addition, I have reviewed and discussed with patient certain preventive protocols, quality metrics, and best practice recommendations. A written personalized care plan for preventive services as well as general preventive health recommendations were provided to patient.  Wynetta Fines, RN  03/29/2018

## 2018-03-30 ENCOUNTER — Ambulatory Visit: Payer: Medicare Other

## 2018-04-04 DIAGNOSIS — I5033 Acute on chronic diastolic (congestive) heart failure: Secondary | ICD-10-CM | POA: Diagnosis not present

## 2018-04-04 DIAGNOSIS — E1122 Type 2 diabetes mellitus with diabetic chronic kidney disease: Secondary | ICD-10-CM | POA: Diagnosis not present

## 2018-04-08 ENCOUNTER — Other Ambulatory Visit: Payer: Self-pay

## 2018-04-08 ENCOUNTER — Encounter: Payer: Self-pay | Admitting: Family

## 2018-04-08 ENCOUNTER — Inpatient Hospital Stay: Payer: Medicare Other

## 2018-04-08 ENCOUNTER — Inpatient Hospital Stay (HOSPITAL_BASED_OUTPATIENT_CLINIC_OR_DEPARTMENT_OTHER): Payer: Medicare Other | Admitting: Family

## 2018-04-08 ENCOUNTER — Inpatient Hospital Stay: Payer: Medicare Other | Attending: Hematology & Oncology

## 2018-04-08 VITALS — BP 121/57 | HR 122 | Temp 98.7°F | Resp 18 | Wt 251.0 lb

## 2018-04-08 DIAGNOSIS — D696 Thrombocytopenia, unspecified: Secondary | ICD-10-CM

## 2018-04-08 DIAGNOSIS — D693 Immune thrombocytopenic purpura: Secondary | ICD-10-CM | POA: Diagnosis not present

## 2018-04-08 DIAGNOSIS — D500 Iron deficiency anemia secondary to blood loss (chronic): Secondary | ICD-10-CM

## 2018-04-08 DIAGNOSIS — Z86711 Personal history of pulmonary embolism: Secondary | ICD-10-CM

## 2018-04-08 DIAGNOSIS — D509 Iron deficiency anemia, unspecified: Secondary | ICD-10-CM | POA: Insufficient documentation

## 2018-04-08 LAB — CBC WITH DIFFERENTIAL (CANCER CENTER ONLY)
Basophils Absolute: 0 10*3/uL (ref 0.0–0.1)
Basophils Relative: 0 %
Eosinophils Absolute: 0.2 10*3/uL (ref 0.0–0.5)
Eosinophils Relative: 3 %
HCT: 31.1 % — ABNORMAL LOW (ref 34.8–46.6)
Hemoglobin: 10.1 g/dL — ABNORMAL LOW (ref 11.6–15.9)
Lymphocytes Relative: 23 %
Lymphs Abs: 1.3 10*3/uL (ref 0.9–3.3)
MCH: 31.7 pg (ref 26.0–34.0)
MCHC: 32.5 g/dL (ref 32.0–36.0)
MCV: 97.5 fL (ref 81.0–101.0)
Monocytes Absolute: 0.5 10*3/uL (ref 0.1–0.9)
Monocytes Relative: 8 %
Neutro Abs: 3.6 10*3/uL (ref 1.5–6.5)
Neutrophils Relative %: 66 %
Platelet Count: 55 10*3/uL — ABNORMAL LOW (ref 145–400)
RBC: 3.19 MIL/uL — ABNORMAL LOW (ref 3.70–5.32)
RDW: 18.6 % — ABNORMAL HIGH (ref 11.1–15.7)
WBC Count: 5.5 10*3/uL (ref 3.9–10.0)

## 2018-04-08 LAB — CMP (CANCER CENTER ONLY)
ALT: 17 U/L (ref 0–44)
AST: 22 U/L (ref 15–41)
Albumin: 4.3 g/dL (ref 3.5–5.0)
Alkaline Phosphatase: 36 U/L — ABNORMAL LOW (ref 38–126)
Anion gap: 15 (ref 5–15)
BUN: 37 mg/dL — ABNORMAL HIGH (ref 8–23)
CO2: 29 mmol/L (ref 22–32)
Calcium: 10.2 mg/dL (ref 8.9–10.3)
Chloride: 98 mmol/L (ref 98–111)
Creatinine: 2.33 mg/dL — ABNORMAL HIGH (ref 0.44–1.00)
GFR, Est AFR Am: 22 mL/min — ABNORMAL LOW (ref >60)
GFR, Estimated: 19 mL/min — ABNORMAL LOW (ref >60)
Glucose, Bld: 200 mg/dL — ABNORMAL HIGH (ref 70–99)
Potassium: 4.8 mmol/L (ref 3.5–5.1)
Sodium: 142 mmol/L (ref 135–145)
Total Bilirubin: 0.7 mg/dL (ref 0.3–1.2)
Total Protein: 7.9 g/dL (ref 6.5–8.1)

## 2018-04-08 MED ORDER — ROMIPLOSTIM INJECTION 500 MCG
450.0000 ug | Freq: Once | SUBCUTANEOUS | Status: AC
  Administered 2018-04-08: 450 ug via SUBCUTANEOUS
  Filled 2018-04-08: qty 0.9

## 2018-04-08 NOTE — Progress Notes (Signed)
Hematology and Oncology Follow Up Visit  [name] [id number] 1934-11-02 83 y.o. 04/08/2018   Principle Diagnosis:  Immune based thrombocytopenia History of PE Iron deficiency anemia  Current Therapy:   Nplate as indicated IV Iron as indicated - dose given on 07/07/2017   Interim History:  [name] is here today for follow-up. She is doing well and has no complaints. She is in a new apartment and loves it. Her platelet count is 55 today, Hgb stable at 10.1, MCV 97.  She has had no episodes of bleeding but does bruise easily.  She states that her home health helper has set her up now to manage her blood sugars at home and record them. She enjoys having some independence again. She states that her blood glucose is much more controlled now.  She has maintained a good appetite and is staying well hydrated. Her weight is stable.  No fever, chills, n/v, cough, rash, dizziness, SOB, chest pain, palpitations, abdominal pain or changes in bowel or bladder habits.  The swelling in her feet and ankles waxes and wanes. She wears her compression stockings daily for support. She uses her rolling walker with seat when ambulating. No falls or syncopal episodes to report.     ECOG Performance Status: 1 - Symptomatic but completely ambulatory  Medications:  Allergies as of 04/08/2018      Reactions   Apixaban Other (See Comments)   Internal Bleeding Patient states internal bleeding.   Aspirin Itching, Rash, Hives, Swelling   Swelling of her tongue   Mirabegron Other (See Comments)   Patient experienced A-Fib   Metformin Diarrhea, Nausea Only   Pineapple Swelling   Throat swells and blisters on tongue and roof of mouth per patient   Tetracycline Hives   Fluticasone-salmeterol Itching, Rash   Iodinated Diagnostic Agents Itching, Rash      Lactose Intolerance (gi) Other (See Comments)   gas   Latex Itching, Rash, Other (See Comments)   Pt. States latex pulls her skin off.    Lisinopril Cough   Metronidazole Other (See Comments)   Unknown   Sulfa Antibiotics Rash   Sulfonamide Derivatives Itching, Rash      Medication List        Accurate as of 04/08/18 12:05 PM. Always use your most recent med list.          amlodipine-atorvastatin 2.5-10 MG tablet Commonly known as:  CADUET Take 1 tablet by mouth daily. 1 tab @@ bedtime.   atorvastatin 40 MG tablet Commonly known as:  LIPITOR Take 40 mg by mouth daily.   CALCIUM 1200 PO Take 1 tablet by mouth daily.   Coenzyme Q10 200 MG capsule Take 200 mg by mouth daily.   docusate sodium 100 MG capsule Commonly known as:  COLACE Take 1 capsule by mouth daily.   fenofibrate 54 MG tablet TAKE 1 TABLET(54 MG) BY MOUTH DAILY   fexofenadine 180 MG tablet Commonly known as:  ALLEGRA Take 180 mg by mouth daily.   furosemide 20 MG tablet Commonly known as:  LASIX Take 2 tablets (40 mg total) by mouth daily as needed.   glipiZIDE 10 MG tablet Commonly known as:  GLUCOTROL Take 1 tablet (10 mg total) by mouth 2 (two) times daily before a meal.   insulin aspart 100 UNIT/ML injection Commonly known as:  novoLOG Inject 25-30 Units into the skin 3 (three) times daily before meals.   insulin glargine 100 UNIT/ML injection Commonly known as:  LANTUS Inject  0.7 mLs (70 Units total) into the skin daily.   Insulin Pen Needle 31G X 5 MM Misc Commonly known as:  B-D UF III MINI PEN NEEDLES Use daily for insulin injection   INSULIN SYRINGE 1CC/30GX5/16" 30G X 5/16" 1 ML Misc USE BID UTD   levothyroxine 112 MCG tablet Commonly known as:  SYNTHROID, LEVOTHROID TAKE 1 TABLET BY MOUTH DAILY   multivitamin tablet Take 1 tablet by mouth daily.   potassium chloride SA 20 MEQ tablet Commonly known as:  K-DUR,KLOR-CON Take 20 mEq by mouth 2 (two) times daily.   potassium citrate 10 MEQ (1080 MG) SR tablet Commonly known as:  UROCIT-K TK 2 TABLETS PO BID WITH MEALS   PROBIOTIC PO Take 1 capsule by mouth  daily.   ranitidine 150 MG tablet Commonly known as:  ZANTAC Take 150 mg by mouth daily as needed for heartburn.   sertraline 50 MG tablet Commonly known as:  ZOLOFT Take 1 tablet (50 mg total) daily by mouth.   vitamin C 250 MG tablet Commonly known as:  ASCORBIC ACID Take 250 mg by mouth daily.   Vitamin D3 2000 units Tabs Take 2,000 Units by mouth daily.       Allergies:  Allergies  Allergen Reactions  . Apixaban Other (See Comments)    Internal Bleeding  Patient states internal bleeding.  . Aspirin Itching, Rash, Hives and Swelling    Swelling of her tongue  . Mirabegron Other (See Comments)    Patient experienced A-Fib  . Metformin Diarrhea and Nausea Only  . Pineapple Swelling    Throat swells and blisters on tongue and roof of mouth per patient  . Tetracycline Hives  . Fluticasone-Salmeterol Itching and Rash  . Iodinated Diagnostic Agents Itching and Rash       . Lactose Intolerance (Gi) Other (See Comments)    gas  . Latex Itching, Rash and Other (See Comments)    Pt. States latex pulls her skin off.   . Lisinopril Cough  . Metronidazole Other (See Comments)    Unknown   . Sulfa Antibiotics Rash  . Sulfonamide Derivatives Itching and Rash    Past Medical History, Surgical history, Social history, and Family History were reviewed and updated.  Review of Systems: All other 10 point review of systems is negative.   Physical Exam:  vitals were not taken for this visit.   Wt Readings from Last 3 Encounters:  03/22/18 251 lb 3.2 oz (113.9 kg)  03/02/18 246 lb 12.8 oz (111.9 kg)  02/11/18 244 lb (110.7 kg)    Ocular: Sclerae unicteric, pupils equal, round and reactive to light Ear-nose-throat: Oropharynx clear, dentition fair Lymphatic: No cervical, supraclavicular or axillary adenopathy Lungs no rales or rhonchi, good excursion bilaterally Heart regular rate and rhythm, no murmur appreciated Abd soft, nontender, positive bowel sounds, no liver  or spleen tip palpated on exam, no fluid wave  MSK no focal spinal tenderness, no joint edema Neuro: non-focal, well-oriented, appropriate affect Breasts: Deferred   Lab Results  Component Value Date   WBC 5.5 04/08/2018   HGB 10.1 (L) 04/08/2018   HCT 31.1 (L) 04/08/2018   MCV 97.5 04/08/2018   PLT 55 (L) 04/08/2018   Lab Results  Component Value Date   FERRITIN 67 01/21/2018   IRON 146 (H) 01/21/2018   TIBC 400 01/21/2018   UIBC 254 01/21/2018   IRONPCTSAT 37 01/21/2018   Lab Results  Component Value Date   RETICCTPCT 1.9 01/28/2011   RBC  3.19 (L) 04/08/2018   RETICCTABS 73.3 01/28/2011   No results found for: KPAFRELGTCHN, LAMBDASER, KAPLAMBRATIO No results found for: Kandis Cocking, IGMSERUM No results found for: Odetta Pink, SPEI   Chemistry      Component Value Date/Time   NA 142 03/11/2018 1149   NA 145 09/08/2017 1317   NA 142 02/25/2017 1013   K 4.7 03/11/2018 1149   K 4.5 09/08/2017 1317   K 3.8 02/25/2017 1013   CL 97 (L) 03/11/2018 1149   CL 98 09/08/2017 1317   CO2 30 03/11/2018 1149   CO2 32 09/08/2017 1317   CO2 26 02/25/2017 1013   BUN 41 (H) 03/11/2018 1149   BUN 34 (H) 09/08/2017 1317   BUN 20.1 02/25/2017 1013   CREATININE 2.70 (H) 03/11/2018 1149   CREATININE 1.9 (H) 09/08/2017 1317   CREATININE 1.8 (H) 02/25/2017 1013      Component Value Date/Time   CALCIUM 9.5 03/11/2018 1149   CALCIUM 9.3 09/08/2017 1317   CALCIUM 8.7 02/25/2017 1013   ALKPHOS 41 03/11/2018 1149   ALKPHOS 45 09/08/2017 1317   ALKPHOS 65 02/25/2017 1013   AST 27 03/11/2018 1149   AST 16 02/25/2017 1013   ALT 25 03/11/2018 1149   ALT 26 09/08/2017 1317   ALT 13 02/25/2017 1013   BILITOT 0.8 03/11/2018 1149   BILITOT 0.76 02/25/2017 1013      Impression and Plan: Ms. Albright is a very pleasant 83 yo caucasian female with immune based thrombocytopenia. She is doing well. Platelet count today is 55. She has  noted some mild bruising.  We will proceed with Nplate today as planned.  She will come back for lab and injection in 3 weeks with follow-up in 6 weeks.  She will contact our office with any questions or concerns. We can certainly see her sooner if need be.   Laverna Peace, NP 8/2/201912:05 PM

## 2018-04-11 ENCOUNTER — Ambulatory Visit (INDEPENDENT_AMBULATORY_CARE_PROVIDER_SITE_OTHER): Payer: Medicare Other | Admitting: Pulmonary Disease

## 2018-04-11 ENCOUNTER — Encounter: Payer: Self-pay | Admitting: Pulmonary Disease

## 2018-04-11 DIAGNOSIS — G4733 Obstructive sleep apnea (adult) (pediatric): Secondary | ICD-10-CM

## 2018-04-11 NOTE — Patient Instructions (Signed)
You are on auto CPAP settings with average pressure of 9 cm and this seems to be working well. Try to be more consistent with using her machine at least 4 to 6 hours every night

## 2018-04-11 NOTE — Progress Notes (Signed)
   Subjective:    Patient ID: [name], female    DOB: July 26, 1935, 83 y.o.   MRN: [id number]  HPI  77/F with BL idiopathic PE 09/2008 for FU of severe obstructive sleep apnea  She now resides at [facility].  She was seen in 02/2018 and a new CPAP machine was prescribed.  She really likes this, this is quiet and she has been able to use this with a full facemask. CPAP is really helped improve her daytime somnolence and fatigue. Bedtime is between 10 PM and midnight, sleep latency is minimal, reports 1-2 nocturnal awakenings and is out of bed by 7:30 AM. CPAP download was reviewed which shows auto settings 5 to 10 cm with average pressure of 9 cm, mild leak and spotty usage about 4 hours every night on average. She states that because they wake her up to give medications very early in the morning and she does not put her machine back on because of being forgetful   Significant tests/ events reviewed  NPSG in 8/09 showed severe obstructive sleep apnea with AHI 85/h, TST 121 mins - no REM sleep. Subsequent titration study showed CPAP requirement of 15 cm with small comfort gel mask  02/16/2018-HST- AHI 26   Review of Systems neg for any significant sore throat, dysphagia, itching, sneezing, nasal congestion or excess/ purulent secretions, fever, chills, sweats, unintended wt loss, pleuritic or exertional cp, hempoptysis, orthopnea pnd or change in chronic leg swelling. Also denies presyncope, palpitations, heartburn, abdominal pain, nausea, vomiting, diarrhea or change in bowel or urinary habits, dysuria,hematuria, rash, arthralgias, visual complaints, headache, numbness weakness or ataxia.     Objective:   Physical Exam   Gen. Pleasant, elderly,obese, in no distress ENT - no lesions, no post nasal drip Neck: No JVD, no thyromegaly, no carotid bruits Lungs: no use of accessory muscles, no dullness to percussion, decreased without rales or rhonchi  Cardiovascular: Rhythm  regular, heart sounds  normal, no murmurs or gallops, no peripheral edema Musculoskeletal: No deformities, no cyanosis or clubbing , no tremors        Assessment & Plan:

## 2018-04-11 NOTE — Assessment & Plan Note (Signed)
on auto CPAP settings with average pressure of 9 cm and this seems to be working well. Try to be more consistent with using her machine at least 4 to 6 hours every night CPAP supplies will be renewed for a year  Weight loss encouraged, compliance with goal of at least 4-6 hrs every night is the expectation. Advised against medications with sedative side effects Cautioned against driving when sleepy - understanding that sleepiness will vary on a day to day basis

## 2018-04-13 DIAGNOSIS — R2681 Unsteadiness on feet: Secondary | ICD-10-CM | POA: Diagnosis not present

## 2018-04-13 DIAGNOSIS — I130 Hypertensive heart and chronic kidney disease with heart failure and stage 1 through stage 4 chronic kidney disease, or unspecified chronic kidney disease: Secondary | ICD-10-CM | POA: Diagnosis not present

## 2018-04-13 DIAGNOSIS — I5033 Acute on chronic diastolic (congestive) heart failure: Secondary | ICD-10-CM | POA: Diagnosis not present

## 2018-04-14 DIAGNOSIS — N183 Chronic kidney disease, stage 3 (moderate): Secondary | ICD-10-CM | POA: Diagnosis not present

## 2018-04-14 DIAGNOSIS — I5033 Acute on chronic diastolic (congestive) heart failure: Secondary | ICD-10-CM | POA: Diagnosis not present

## 2018-04-14 DIAGNOSIS — E039 Hypothyroidism, unspecified: Secondary | ICD-10-CM | POA: Diagnosis not present

## 2018-04-14 DIAGNOSIS — I2510 Atherosclerotic heart disease of native coronary artery without angina pectoris: Secondary | ICD-10-CM | POA: Diagnosis not present

## 2018-04-14 DIAGNOSIS — E1165 Type 2 diabetes mellitus with hyperglycemia: Secondary | ICD-10-CM | POA: Diagnosis not present

## 2018-04-14 DIAGNOSIS — I130 Hypertensive heart and chronic kidney disease with heart failure and stage 1 through stage 4 chronic kidney disease, or unspecified chronic kidney disease: Secondary | ICD-10-CM | POA: Diagnosis not present

## 2018-04-14 DIAGNOSIS — I509 Heart failure, unspecified: Secondary | ICD-10-CM | POA: Diagnosis not present

## 2018-04-14 DIAGNOSIS — R2681 Unsteadiness on feet: Secondary | ICD-10-CM | POA: Diagnosis not present

## 2018-04-18 DIAGNOSIS — I5033 Acute on chronic diastolic (congestive) heart failure: Secondary | ICD-10-CM | POA: Diagnosis not present

## 2018-04-18 DIAGNOSIS — R2681 Unsteadiness on feet: Secondary | ICD-10-CM | POA: Diagnosis not present

## 2018-04-18 DIAGNOSIS — I130 Hypertensive heart and chronic kidney disease with heart failure and stage 1 through stage 4 chronic kidney disease, or unspecified chronic kidney disease: Secondary | ICD-10-CM | POA: Diagnosis not present

## 2018-04-19 DIAGNOSIS — F411 Generalized anxiety disorder: Secondary | ICD-10-CM | POA: Diagnosis not present

## 2018-04-20 DIAGNOSIS — E1122 Type 2 diabetes mellitus with diabetic chronic kidney disease: Secondary | ICD-10-CM | POA: Diagnosis not present

## 2018-04-20 DIAGNOSIS — I5033 Acute on chronic diastolic (congestive) heart failure: Secondary | ICD-10-CM | POA: Diagnosis not present

## 2018-04-20 DIAGNOSIS — R2681 Unsteadiness on feet: Secondary | ICD-10-CM | POA: Diagnosis not present

## 2018-04-20 DIAGNOSIS — I130 Hypertensive heart and chronic kidney disease with heart failure and stage 1 through stage 4 chronic kidney disease, or unspecified chronic kidney disease: Secondary | ICD-10-CM | POA: Diagnosis not present

## 2018-04-21 DIAGNOSIS — I130 Hypertensive heart and chronic kidney disease with heart failure and stage 1 through stage 4 chronic kidney disease, or unspecified chronic kidney disease: Secondary | ICD-10-CM | POA: Diagnosis not present

## 2018-04-21 DIAGNOSIS — R2681 Unsteadiness on feet: Secondary | ICD-10-CM | POA: Diagnosis not present

## 2018-04-21 DIAGNOSIS — I5033 Acute on chronic diastolic (congestive) heart failure: Secondary | ICD-10-CM | POA: Diagnosis not present

## 2018-04-25 DIAGNOSIS — I130 Hypertensive heart and chronic kidney disease with heart failure and stage 1 through stage 4 chronic kidney disease, or unspecified chronic kidney disease: Secondary | ICD-10-CM | POA: Diagnosis not present

## 2018-04-25 DIAGNOSIS — R2681 Unsteadiness on feet: Secondary | ICD-10-CM | POA: Diagnosis not present

## 2018-04-25 DIAGNOSIS — I5033 Acute on chronic diastolic (congestive) heart failure: Secondary | ICD-10-CM | POA: Diagnosis not present

## 2018-04-27 DIAGNOSIS — I130 Hypertensive heart and chronic kidney disease with heart failure and stage 1 through stage 4 chronic kidney disease, or unspecified chronic kidney disease: Secondary | ICD-10-CM | POA: Diagnosis not present

## 2018-04-27 DIAGNOSIS — R2681 Unsteadiness on feet: Secondary | ICD-10-CM | POA: Diagnosis not present

## 2018-04-27 DIAGNOSIS — I5033 Acute on chronic diastolic (congestive) heart failure: Secondary | ICD-10-CM | POA: Diagnosis not present

## 2018-04-28 DIAGNOSIS — R2681 Unsteadiness on feet: Secondary | ICD-10-CM | POA: Diagnosis not present

## 2018-04-28 DIAGNOSIS — I5033 Acute on chronic diastolic (congestive) heart failure: Secondary | ICD-10-CM | POA: Diagnosis not present

## 2018-04-28 DIAGNOSIS — I130 Hypertensive heart and chronic kidney disease with heart failure and stage 1 through stage 4 chronic kidney disease, or unspecified chronic kidney disease: Secondary | ICD-10-CM | POA: Diagnosis not present

## 2018-04-29 ENCOUNTER — Other Ambulatory Visit: Payer: Self-pay | Admitting: Family

## 2018-04-29 ENCOUNTER — Inpatient Hospital Stay: Payer: Medicare Other

## 2018-04-29 VITALS — BP 125/70 | HR 123 | Temp 97.7°F | Resp 18

## 2018-04-29 DIAGNOSIS — D693 Immune thrombocytopenic purpura: Secondary | ICD-10-CM | POA: Diagnosis not present

## 2018-04-29 DIAGNOSIS — D509 Iron deficiency anemia, unspecified: Secondary | ICD-10-CM | POA: Diagnosis not present

## 2018-04-29 DIAGNOSIS — D696 Thrombocytopenia, unspecified: Secondary | ICD-10-CM

## 2018-04-29 DIAGNOSIS — D500 Iron deficiency anemia secondary to blood loss (chronic): Secondary | ICD-10-CM

## 2018-04-29 DIAGNOSIS — Z86711 Personal history of pulmonary embolism: Secondary | ICD-10-CM | POA: Diagnosis not present

## 2018-04-29 LAB — CMP (CANCER CENTER ONLY)
ALT: 15 U/L (ref 0–44)
AST: 22 U/L (ref 15–41)
Albumin: 4.1 g/dL (ref 3.5–5.0)
Alkaline Phosphatase: 39 U/L (ref 38–126)
Anion gap: 13 (ref 5–15)
BUN: 47 mg/dL — ABNORMAL HIGH (ref 8–23)
CO2: 31 mmol/L (ref 22–32)
Calcium: 10.2 mg/dL (ref 8.9–10.3)
Chloride: 100 mmol/L (ref 98–111)
Creatinine: 2.4 mg/dL — ABNORMAL HIGH (ref 0.44–1.00)
GFR, Est AFR Am: 21 mL/min — ABNORMAL LOW (ref >60)
GFR, Estimated: 18 mL/min — ABNORMAL LOW (ref >60)
Glucose, Bld: 206 mg/dL — ABNORMAL HIGH (ref 70–99)
Potassium: 4.6 mmol/L (ref 3.5–5.1)
Sodium: 144 mmol/L (ref 135–145)
Total Bilirubin: 0.5 mg/dL (ref 0.3–1.2)
Total Protein: 7.8 g/dL (ref 6.5–8.1)

## 2018-04-29 LAB — CBC WITH DIFFERENTIAL (CANCER CENTER ONLY)
Basophils Absolute: 0 10*3/uL (ref 0.0–0.1)
Basophils Relative: 0 %
Eosinophils Absolute: 0.2 10*3/uL (ref 0.0–0.5)
Eosinophils Relative: 4 %
HCT: 31.7 % — ABNORMAL LOW (ref 34.8–46.6)
Hemoglobin: 10.2 g/dL — ABNORMAL LOW (ref 11.6–15.9)
Lymphocytes Relative: 23 %
Lymphs Abs: 1.2 10*3/uL (ref 0.9–3.3)
MCH: 31.3 pg (ref 26.0–34.0)
MCHC: 32.2 g/dL (ref 32.0–36.0)
MCV: 97.2 fL (ref 81.0–101.0)
Monocytes Absolute: 0.5 10*3/uL (ref 0.1–0.9)
Monocytes Relative: 8 %
Neutro Abs: 3.5 10*3/uL (ref 1.5–6.5)
Neutrophils Relative %: 65 %
Platelet Count: 28 10*3/uL — ABNORMAL LOW (ref 145–400)
RBC: 3.26 MIL/uL — ABNORMAL LOW (ref 3.70–5.32)
RDW: 18.6 % — ABNORMAL HIGH (ref 11.1–15.7)
WBC Count: 5.4 10*3/uL (ref 3.9–10.0)

## 2018-04-29 MED ORDER — ROMIPLOSTIM INJECTION 500 MCG
570.0000 ug | Freq: Once | SUBCUTANEOUS | Status: AC
Start: 2018-04-29 — End: 2018-04-29
  Administered 2018-04-29: 570 ug via SUBCUTANEOUS
  Filled 2018-04-29: qty 1

## 2018-04-29 NOTE — Patient Instructions (Signed)
Romiplostim injection What is this medicine? ROMIPLOSTIM (roe mi PLOE stim) helps your body make more platelets. This medicine is used to treat low platelets caused by chronic idiopathic thrombocytopenic purpura (ITP). This medicine may be used for other purposes; ask your health care provider or pharmacist if you have questions. COMMON BRAND NAME(S): Nplate What should I tell my health care provider before I take this medicine? They need to know if you have any of these conditions: -cancer or myelodysplastic syndrome -low blood counts, like low white cell, platelet, or red cell counts -take medicines that treat or prevent blood clots -an unusual or allergic reaction to romiplostim, mannitol, other medicines, foods, dyes, or preservatives -pregnant or trying to get pregnant -breast-feeding How should I use this medicine? This medicine is for injection under the skin. It is given by a health care professional in a hospital or clinic setting. A special MedGuide will be given to you before your injection. Read this information carefully each time. Talk to your pediatrician regarding the use of this medicine in children. Special care may be needed. Overdosage: If you think you have taken too much of this medicine contact a poison control center or emergency room at once. NOTE: This medicine is only for you. Do not share this medicine with others. What if I miss a dose? It is important not to miss your dose. Call your doctor or health care professional if you are unable to keep an appointment. What may interact with this medicine? Interactions are not expected. This list may not describe all possible interactions. Give your health care provider a list of all the medicines, herbs, non-prescription drugs, or dietary supplements you use. Also tell them if you smoke, drink alcohol, or use illegal drugs. Some items may interact with your medicine. What should I watch for while using this  medicine? Your condition will be monitored carefully while you are receiving this medicine. Visit your prescriber or health care professional for regular checks on your progress and for the needed blood tests. It is important to keep all appointments. What side effects may I notice from receiving this medicine? Side effects that you should report to your doctor or health care professional as soon as possible: -allergic reactions like skin rash, itching or hives, swelling of the face, lips, or tongue -shortness of breath, chest pain, swelling in a leg -unusual bleeding or bruising Side effects that usually do not require medical attention (report to your doctor or health care professional if they continue or are bothersome): -dizziness -headache -muscle aches -pain in arms and legs -stomach pain -trouble sleeping This list may not describe all possible side effects. Call your doctor for medical advice about side effects. You may report side effects to FDA at 1-800-FDA-1088. Where should I keep my medicine? This drug is given in a hospital or clinic and will not be stored at home. NOTE: This sheet is a summary. It may not cover all possible information. If you have questions about this medicine, talk to your doctor, pharmacist, or health care provider.  2018 Elsevier/Gold Standard (2008-04-23 15:13:04)  

## 2018-05-02 DIAGNOSIS — R2681 Unsteadiness on feet: Secondary | ICD-10-CM | POA: Diagnosis not present

## 2018-05-02 DIAGNOSIS — I5033 Acute on chronic diastolic (congestive) heart failure: Secondary | ICD-10-CM | POA: Diagnosis not present

## 2018-05-02 DIAGNOSIS — I130 Hypertensive heart and chronic kidney disease with heart failure and stage 1 through stage 4 chronic kidney disease, or unspecified chronic kidney disease: Secondary | ICD-10-CM | POA: Diagnosis not present

## 2018-05-04 DIAGNOSIS — I5033 Acute on chronic diastolic (congestive) heart failure: Secondary | ICD-10-CM | POA: Diagnosis not present

## 2018-05-04 DIAGNOSIS — I130 Hypertensive heart and chronic kidney disease with heart failure and stage 1 through stage 4 chronic kidney disease, or unspecified chronic kidney disease: Secondary | ICD-10-CM | POA: Diagnosis not present

## 2018-05-04 DIAGNOSIS — R2681 Unsteadiness on feet: Secondary | ICD-10-CM | POA: Diagnosis not present

## 2018-05-05 DIAGNOSIS — R2681 Unsteadiness on feet: Secondary | ICD-10-CM | POA: Diagnosis not present

## 2018-05-05 DIAGNOSIS — I5033 Acute on chronic diastolic (congestive) heart failure: Secondary | ICD-10-CM | POA: Diagnosis not present

## 2018-05-05 DIAGNOSIS — I130 Hypertensive heart and chronic kidney disease with heart failure and stage 1 through stage 4 chronic kidney disease, or unspecified chronic kidney disease: Secondary | ICD-10-CM | POA: Diagnosis not present

## 2018-05-06 ENCOUNTER — Inpatient Hospital Stay: Payer: Medicare Other

## 2018-05-06 VITALS — BP 140/64 | HR 104 | Temp 98.0°F | Resp 20

## 2018-05-06 DIAGNOSIS — D509 Iron deficiency anemia, unspecified: Secondary | ICD-10-CM | POA: Diagnosis not present

## 2018-05-06 DIAGNOSIS — D500 Iron deficiency anemia secondary to blood loss (chronic): Secondary | ICD-10-CM

## 2018-05-06 DIAGNOSIS — D693 Immune thrombocytopenic purpura: Secondary | ICD-10-CM | POA: Diagnosis not present

## 2018-05-06 DIAGNOSIS — D696 Thrombocytopenia, unspecified: Secondary | ICD-10-CM

## 2018-05-06 DIAGNOSIS — Z86711 Personal history of pulmonary embolism: Secondary | ICD-10-CM | POA: Diagnosis not present

## 2018-05-06 LAB — CMP (CANCER CENTER ONLY)
ALT: 24 U/L (ref 10–47)
AST: 30 U/L (ref 11–38)
Albumin: 3.8 g/dL (ref 3.5–5.0)
Alkaline Phosphatase: 35 U/L (ref 26–84)
Anion gap: 10 (ref 5–15)
BUN: 34 mg/dL — ABNORMAL HIGH (ref 7–22)
CO2: 32 mmol/L (ref 18–33)
Calcium: 9.3 mg/dL (ref 8.0–10.3)
Chloride: 100 mmol/L (ref 98–108)
Creatinine: 2 mg/dL — ABNORMAL HIGH (ref 0.60–1.20)
Glucose, Bld: 235 mg/dL — ABNORMAL HIGH (ref 73–118)
Potassium: 4.7 mmol/L (ref 3.3–4.7)
Sodium: 142 mmol/L (ref 128–145)
Total Bilirubin: 0.8 mg/dL (ref 0.2–1.6)
Total Protein: 7.5 g/dL (ref 6.4–8.1)

## 2018-05-06 LAB — CBC WITH DIFFERENTIAL (CANCER CENTER ONLY)
Basophils Absolute: 0 10*3/uL (ref 0.0–0.1)
Basophils Relative: 0 %
Eosinophils Absolute: 0.2 10*3/uL (ref 0.0–0.5)
Eosinophils Relative: 4 %
HCT: 32 % — ABNORMAL LOW (ref 34.8–46.6)
Hemoglobin: 10.3 g/dL — ABNORMAL LOW (ref 11.6–15.9)
Lymphocytes Relative: 20 %
Lymphs Abs: 1.2 10*3/uL (ref 0.9–3.3)
MCH: 31.4 pg (ref 26.0–34.0)
MCHC: 32.2 g/dL (ref 32.0–36.0)
MCV: 97.6 fL (ref 81.0–101.0)
Monocytes Absolute: 0.4 10*3/uL (ref 0.1–0.9)
Monocytes Relative: 7 %
Neutro Abs: 4 10*3/uL (ref 1.5–6.5)
Neutrophils Relative %: 69 %
Platelet Count: 72 10*3/uL — ABNORMAL LOW (ref 145–400)
RBC: 3.28 MIL/uL — ABNORMAL LOW (ref 3.70–5.32)
RDW: 19 % — ABNORMAL HIGH (ref 11.1–15.7)
WBC Count: 5.8 10*3/uL (ref 3.9–10.0)

## 2018-05-06 MED ORDER — ROMIPLOSTIM INJECTION 500 MCG
570.0000 ug | Freq: Once | SUBCUTANEOUS | Status: AC
  Administered 2018-05-06: 570 ug via SUBCUTANEOUS
  Filled 2018-05-06: qty 1

## 2018-05-06 NOTE — Patient Instructions (Signed)
Romiplostim injection What is this medicine? ROMIPLOSTIM (roe mi PLOE stim) helps your body make more platelets. This medicine is used to treat low platelets caused by chronic idiopathic thrombocytopenic purpura (ITP). This medicine may be used for other purposes; ask your health care provider or pharmacist if you have questions. COMMON BRAND NAME(S): Nplate What should I tell my health care provider before I take this medicine? They need to know if you have any of these conditions: -cancer or myelodysplastic syndrome -low blood counts, like low white cell, platelet, or red cell counts -take medicines that treat or prevent blood clots -an unusual or allergic reaction to romiplostim, mannitol, other medicines, foods, dyes, or preservatives -pregnant or trying to get pregnant -breast-feeding How should I use this medicine? This medicine is for injection under the skin. It is given by a health care professional in a hospital or clinic setting. A special MedGuide will be given to you before your injection. Read this information carefully each time. Talk to your pediatrician regarding the use of this medicine in children. Special care may be needed. Overdosage: If you think you have taken too much of this medicine contact a poison control center or emergency room at once. NOTE: This medicine is only for you. Do not share this medicine with others. What if I miss a dose? It is important not to miss your dose. Call your doctor or health care professional if you are unable to keep an appointment. What may interact with this medicine? Interactions are not expected. This list may not describe all possible interactions. Give your health care provider a list of all the medicines, herbs, non-prescription drugs, or dietary supplements you use. Also tell them if you smoke, drink alcohol, or use illegal drugs. Some items may interact with your medicine. What should I watch for while using this  medicine? Your condition will be monitored carefully while you are receiving this medicine. Visit your prescriber or health care professional for regular checks on your progress and for the needed blood tests. It is important to keep all appointments. What side effects may I notice from receiving this medicine? Side effects that you should report to your doctor or health care professional as soon as possible: -allergic reactions like skin rash, itching or hives, swelling of the face, lips, or tongue -shortness of breath, chest pain, swelling in a leg -unusual bleeding or bruising Side effects that usually do not require medical attention (report to your doctor or health care professional if they continue or are bothersome): -dizziness -headache -muscle aches -pain in arms and legs -stomach pain -trouble sleeping This list may not describe all possible side effects. Call your doctor for medical advice about side effects. You may report side effects to FDA at 1-800-FDA-1088. Where should I keep my medicine? This drug is given in a hospital or clinic and will not be stored at home. NOTE: This sheet is a summary. It may not cover all possible information. If you have questions about this medicine, talk to your doctor, pharmacist, or health care provider.  2018 Elsevier/Gold Standard (2008-04-23 15:13:04)  

## 2018-05-08 DIAGNOSIS — N200 Calculus of kidney: Secondary | ICD-10-CM | POA: Diagnosis not present

## 2018-05-09 DIAGNOSIS — R2681 Unsteadiness on feet: Secondary | ICD-10-CM | POA: Diagnosis not present

## 2018-05-09 DIAGNOSIS — I130 Hypertensive heart and chronic kidney disease with heart failure and stage 1 through stage 4 chronic kidney disease, or unspecified chronic kidney disease: Secondary | ICD-10-CM | POA: Diagnosis not present

## 2018-05-09 DIAGNOSIS — I5033 Acute on chronic diastolic (congestive) heart failure: Secondary | ICD-10-CM | POA: Diagnosis not present

## 2018-05-11 DIAGNOSIS — R2681 Unsteadiness on feet: Secondary | ICD-10-CM | POA: Diagnosis not present

## 2018-05-11 DIAGNOSIS — I5033 Acute on chronic diastolic (congestive) heart failure: Secondary | ICD-10-CM | POA: Diagnosis not present

## 2018-05-11 DIAGNOSIS — I130 Hypertensive heart and chronic kidney disease with heart failure and stage 1 through stage 4 chronic kidney disease, or unspecified chronic kidney disease: Secondary | ICD-10-CM | POA: Diagnosis not present

## 2018-05-12 DIAGNOSIS — I5033 Acute on chronic diastolic (congestive) heart failure: Secondary | ICD-10-CM | POA: Diagnosis not present

## 2018-05-12 DIAGNOSIS — I130 Hypertensive heart and chronic kidney disease with heart failure and stage 1 through stage 4 chronic kidney disease, or unspecified chronic kidney disease: Secondary | ICD-10-CM | POA: Diagnosis not present

## 2018-05-13 ENCOUNTER — Inpatient Hospital Stay: Payer: Medicare Other

## 2018-05-13 ENCOUNTER — Inpatient Hospital Stay: Payer: Medicare Other | Attending: Hematology & Oncology

## 2018-05-13 VITALS — BP 139/69 | HR 112 | Temp 97.9°F | Resp 20

## 2018-05-13 DIAGNOSIS — D693 Immune thrombocytopenic purpura: Secondary | ICD-10-CM | POA: Insufficient documentation

## 2018-05-13 DIAGNOSIS — D509 Iron deficiency anemia, unspecified: Secondary | ICD-10-CM | POA: Diagnosis not present

## 2018-05-13 DIAGNOSIS — G629 Polyneuropathy, unspecified: Secondary | ICD-10-CM | POA: Diagnosis not present

## 2018-05-13 DIAGNOSIS — D500 Iron deficiency anemia secondary to blood loss (chronic): Secondary | ICD-10-CM

## 2018-05-13 DIAGNOSIS — Z86711 Personal history of pulmonary embolism: Secondary | ICD-10-CM | POA: Diagnosis not present

## 2018-05-13 DIAGNOSIS — R2681 Unsteadiness on feet: Secondary | ICD-10-CM | POA: Diagnosis not present

## 2018-05-13 DIAGNOSIS — I5033 Acute on chronic diastolic (congestive) heart failure: Secondary | ICD-10-CM | POA: Diagnosis not present

## 2018-05-13 DIAGNOSIS — Z79899 Other long term (current) drug therapy: Secondary | ICD-10-CM | POA: Insufficient documentation

## 2018-05-13 DIAGNOSIS — I130 Hypertensive heart and chronic kidney disease with heart failure and stage 1 through stage 4 chronic kidney disease, or unspecified chronic kidney disease: Secondary | ICD-10-CM | POA: Diagnosis not present

## 2018-05-13 DIAGNOSIS — Z794 Long term (current) use of insulin: Secondary | ICD-10-CM | POA: Insufficient documentation

## 2018-05-13 DIAGNOSIS — D696 Thrombocytopenia, unspecified: Secondary | ICD-10-CM

## 2018-05-13 LAB — CBC WITH DIFFERENTIAL (CANCER CENTER ONLY)
Basophils Absolute: 0 10*3/uL (ref 0.0–0.1)
Basophils Relative: 1 %
Eosinophils Absolute: 0.2 10*3/uL (ref 0.0–0.5)
Eosinophils Relative: 3 %
HCT: 31.6 % — ABNORMAL LOW (ref 34.8–46.6)
Hemoglobin: 10.2 g/dL — ABNORMAL LOW (ref 11.6–15.9)
Lymphocytes Relative: 21 %
Lymphs Abs: 1.1 10*3/uL (ref 0.9–3.3)
MCH: 31.4 pg (ref 26.0–34.0)
MCHC: 32.3 g/dL (ref 32.0–36.0)
MCV: 97.2 fL (ref 81.0–101.0)
Monocytes Absolute: 0.4 10*3/uL (ref 0.1–0.9)
Monocytes Relative: 7 %
Neutro Abs: 3.9 10*3/uL (ref 1.5–6.5)
Neutrophils Relative %: 68 %
Platelet Count: 97 10*3/uL — ABNORMAL LOW (ref 145–400)
RBC: 3.25 MIL/uL — ABNORMAL LOW (ref 3.70–5.32)
RDW: 18.7 % — ABNORMAL HIGH (ref 11.1–15.7)
WBC Count: 5.5 10*3/uL (ref 3.9–10.0)

## 2018-05-13 LAB — CMP (CANCER CENTER ONLY)
ALT: 27 U/L (ref 10–47)
AST: 31 U/L (ref 11–38)
Albumin: 3.8 g/dL (ref 3.5–5.0)
Alkaline Phosphatase: 40 U/L (ref 26–84)
Anion gap: 7 (ref 5–15)
BUN: 34 mg/dL — ABNORMAL HIGH (ref 7–22)
CO2: 30 mmol/L (ref 18–33)
Calcium: 9.7 mg/dL (ref 8.0–10.3)
Chloride: 106 mmol/L (ref 98–108)
Creatinine: 2.2 mg/dL — ABNORMAL HIGH (ref 0.60–1.20)
Glucose, Bld: 224 mg/dL — ABNORMAL HIGH (ref 73–118)
Potassium: 4.4 mmol/L (ref 3.3–4.7)
Sodium: 143 mmol/L (ref 128–145)
Total Bilirubin: 0.8 mg/dL (ref 0.2–1.6)
Total Protein: 7.3 g/dL (ref 6.4–8.1)

## 2018-05-13 MED ORDER — ROMIPLOSTIM INJECTION 500 MCG
570.0000 ug | Freq: Once | SUBCUTANEOUS | Status: AC
  Administered 2018-05-13: 570 ug via SUBCUTANEOUS
  Filled 2018-05-13: qty 0.14

## 2018-05-13 NOTE — Patient Instructions (Signed)
Romiplostim injection What is this medicine? ROMIPLOSTIM (roe mi PLOE stim) helps your body make more platelets. This medicine is used to treat low platelets caused by chronic idiopathic thrombocytopenic purpura (ITP). This medicine may be used for other purposes; ask your health care provider or pharmacist if you have questions. COMMON BRAND NAME(S): Nplate What should I tell my health care provider before I take this medicine? They need to know if you have any of these conditions: -cancer or myelodysplastic syndrome -low blood counts, like low white cell, platelet, or red cell counts -take medicines that treat or prevent blood clots -an unusual or allergic reaction to romiplostim, mannitol, other medicines, foods, dyes, or preservatives -pregnant or trying to get pregnant -breast-feeding How should I use this medicine? This medicine is for injection under the skin. It is given by a health care professional in a hospital or clinic setting. A special MedGuide will be given to you before your injection. Read this information carefully each time. Talk to your pediatrician regarding the use of this medicine in children. Special care may be needed. Overdosage: If you think you have taken too much of this medicine contact a poison control center or emergency room at once. NOTE: This medicine is only for you. Do not share this medicine with others. What if I miss a dose? It is important not to miss your dose. Call your doctor or health care professional if you are unable to keep an appointment. What may interact with this medicine? Interactions are not expected. This list may not describe all possible interactions. Give your health care provider a list of all the medicines, herbs, non-prescription drugs, or dietary supplements you use. Also tell them if you smoke, drink alcohol, or use illegal drugs. Some items may interact with your medicine. What should I watch for while using this  medicine? Your condition will be monitored carefully while you are receiving this medicine. Visit your prescriber or health care professional for regular checks on your progress and for the needed blood tests. It is important to keep all appointments. What side effects may I notice from receiving this medicine? Side effects that you should report to your doctor or health care professional as soon as possible: -allergic reactions like skin rash, itching or hives, swelling of the face, lips, or tongue -shortness of breath, chest pain, swelling in a leg -unusual bleeding or bruising Side effects that usually do not require medical attention (report to your doctor or health care professional if they continue or are bothersome): -dizziness -headache -muscle aches -pain in arms and legs -stomach pain -trouble sleeping This list may not describe all possible side effects. Call your doctor for medical advice about side effects. You may report side effects to FDA at 1-800-FDA-1088. Where should I keep my medicine? This drug is given in a hospital or clinic and will not be stored at home. NOTE: This sheet is a summary. It may not cover all possible information. If you have questions about this medicine, talk to your doctor, pharmacist, or health care provider.  2018 Elsevier/Gold Standard (2008-04-23 15:13:04)  

## 2018-05-16 DIAGNOSIS — I5033 Acute on chronic diastolic (congestive) heart failure: Secondary | ICD-10-CM | POA: Diagnosis not present

## 2018-05-16 DIAGNOSIS — I130 Hypertensive heart and chronic kidney disease with heart failure and stage 1 through stage 4 chronic kidney disease, or unspecified chronic kidney disease: Secondary | ICD-10-CM | POA: Diagnosis not present

## 2018-05-16 DIAGNOSIS — R2681 Unsteadiness on feet: Secondary | ICD-10-CM | POA: Diagnosis not present

## 2018-05-18 DIAGNOSIS — I130 Hypertensive heart and chronic kidney disease with heart failure and stage 1 through stage 4 chronic kidney disease, or unspecified chronic kidney disease: Secondary | ICD-10-CM | POA: Diagnosis not present

## 2018-05-18 DIAGNOSIS — I5033 Acute on chronic diastolic (congestive) heart failure: Secondary | ICD-10-CM | POA: Diagnosis not present

## 2018-05-18 DIAGNOSIS — R2681 Unsteadiness on feet: Secondary | ICD-10-CM | POA: Diagnosis not present

## 2018-05-19 ENCOUNTER — Ambulatory Visit (INDEPENDENT_AMBULATORY_CARE_PROVIDER_SITE_OTHER): Payer: Medicare Other | Admitting: Internal Medicine

## 2018-05-19 ENCOUNTER — Encounter

## 2018-05-19 ENCOUNTER — Encounter: Payer: Self-pay | Admitting: Internal Medicine

## 2018-05-19 VITALS — BP 118/74 | HR 124 | Ht 61.0 in | Wt 252.4 lb

## 2018-05-19 DIAGNOSIS — E1122 Type 2 diabetes mellitus with diabetic chronic kidney disease: Secondary | ICD-10-CM | POA: Diagnosis not present

## 2018-05-19 DIAGNOSIS — I130 Hypertensive heart and chronic kidney disease with heart failure and stage 1 through stage 4 chronic kidney disease, or unspecified chronic kidney disease: Secondary | ICD-10-CM | POA: Diagnosis not present

## 2018-05-19 DIAGNOSIS — R000 Tachycardia, unspecified: Secondary | ICD-10-CM | POA: Diagnosis not present

## 2018-05-19 DIAGNOSIS — N184 Chronic kidney disease, stage 4 (severe): Secondary | ICD-10-CM

## 2018-05-19 DIAGNOSIS — Z23 Encounter for immunization: Secondary | ICD-10-CM | POA: Diagnosis not present

## 2018-05-19 DIAGNOSIS — Z6841 Body Mass Index (BMI) 40.0 and over, adult: Secondary | ICD-10-CM | POA: Diagnosis not present

## 2018-05-19 DIAGNOSIS — Z794 Long term (current) use of insulin: Secondary | ICD-10-CM | POA: Diagnosis not present

## 2018-05-19 DIAGNOSIS — E890 Postprocedural hypothyroidism: Secondary | ICD-10-CM | POA: Diagnosis not present

## 2018-05-19 DIAGNOSIS — R2681 Unsteadiness on feet: Secondary | ICD-10-CM | POA: Diagnosis not present

## 2018-05-19 DIAGNOSIS — E7800 Pure hypercholesterolemia, unspecified: Secondary | ICD-10-CM

## 2018-05-19 DIAGNOSIS — I5033 Acute on chronic diastolic (congestive) heart failure: Secondary | ICD-10-CM | POA: Diagnosis not present

## 2018-05-19 LAB — LIPID PANEL
Cholesterol: 131 mg/dL (ref 0–200)
HDL: 26.7 mg/dL — ABNORMAL LOW (ref >39.00)
Total CHOL/HDL Ratio: 5
Triglycerides: 656 mg/dL — ABNORMAL HIGH (ref 0.0–149.0)

## 2018-05-19 LAB — POCT GLYCOSYLATED HEMOGLOBIN (HGB A1C): Hemoglobin A1C: 7.4 % — AB (ref 4.0–5.6)

## 2018-05-19 LAB — LDL CHOLESTEROL, DIRECT: Direct LDL: 41 mg/dL

## 2018-05-19 LAB — T4, FREE: Free T4: 0.73 ng/dL (ref 0.60–1.60)

## 2018-05-19 LAB — TSH: TSH: 0.92 u[IU]/mL (ref 0.35–4.50)

## 2018-05-19 MED ORDER — INSULIN ASPART 100 UNIT/ML ~~LOC~~ SOLN: 30.0000 [IU] | Freq: Three times a day (TID) | SUBCUTANEOUS | 3 refills | Status: DC

## 2018-05-19 MED ORDER — INSULIN GLARGINE 100 UNIT/ML ~~LOC~~ SOLN: 80.0000 [IU] | Freq: Every day | SUBCUTANEOUS | 3 refills | Status: DC

## 2018-05-19 NOTE — Progress Notes (Signed)
Patient ID: [name], female   DOB: 1934/10/15, 83 y.o.   MRN: [id number]   HPI: [name] is a 83 y.o.-year-old female, returning for f/u for DM2, dx in late 1990s, insulin-dependent since 2012, uncontrolled, with complications (CKD stage 4, CHF, + DR) and hypothyroidism. She prev. Saw Dr. Chalmers [name]. Last visit with me 2 months ago.  She is here with her daughter who offers part of the history especially regarding patient's insulin doses, blood sugars, past medical history.  She had a mild MI earlier this year and CHF.  She continues to reside in [facility] facility. She started exercise.  She is on an ABx before dental work.  DM2:  Last hemoglobin A1c was: Lab Results  Component Value Date   HGBA1C 8.3 (A) 03/22/2018   HGBA1C 8.2 11/18/2017   HGBA1C 7.1 (H) 08/19/2017  08/19/2017: HbA1c calculated from fructosamine is 6.7%  She was on: - Glipizide 10 mg 2x a day before meals  Insulin Before breakfast Before lunch Before dinner  Regular  25  10   25   NPH  50   50  Please inject the insulin 30 min before meals.  At last visit, she was on -in the facility (!!!): - Glipizide 5 mg before b'fast - Lantus 10 units at night - NovoLog target 200, ISF 25  Currently on: - Glipizide 10 mg 2x a day before meals - Lantus 70 units at bedtime - Humalog  30 units before breakfast 25 units before lunch 30 units before dinner   Pt checks her sugars 4 times a day: - am:  295-427 >> 99, 262-343 >> 192-264 - 2h after b'fast: n/c >> 290 >> n/c - before lunch: 312-390 >> 269-362 >> 187-232, 286 - 2h after lunch: n/c - before dinner:  312-559 >> 207-364, 431 >> 144-273 - 2h after dinner: n/c >> 261 - bedtime: 335-438 >> 207-349, 391 >> 182-296 - nighttime: n/c Lowest sugar was 295 >> 99 x1 >> 144 ; she has hypoglycemia awareness at 100. Highest sugar was 559 >> 431 >> 296.  Glucometer: True Metrix air  Pt's meals are: - Breakfast: grits, bacon, green tea + stevia >> eggs  + bacon + toast/grits + fruit - Lunch: 1/2 sandwich + salad + chips and drink >> 1/2 sandwich - snack: fruit - Dinner:meat + veggies + occas. Starch >> meat + veggies - Snacks: 2 - usually salty  -She has stage IV CKD, last BUN/creatinine:  Lab Results  Component Value Date   BUN 34 (H) 05/13/2018   BUN 34 (H) 05/06/2018   CREATININE 2.20 (H) 05/13/2018   CREATININE 2.00 (H) 05/06/2018  03/15/2018: 47/2.34, GFR 19, Glu 283  Reviewed GFR: Lab Results  Component Value Date   GFRNONAA 18 (L) 04/29/2018   GFRNONAA 19 (L) 04/08/2018   GFRNONAA 14 (L) 01/21/2018   GFRNONAA 21 (L) 12/31/2017   GFRNONAA 17 (L) 10/08/2017   GFRNONAA 18 (L) 09/15/2017   GFRNONAA 21 (L) 06/28/2017   GFRNONAA 29 (L) 04/23/2017   GFRNONAA 28 (L) 02/09/2017   GFRNONAA 28 (L) 02/02/2017  He has a h/o uric acid kidney stones.  -+ HL. last set of lipids: Lab Results  Component Value Date   CHOL 166 12/15/2016   HDL 30.50 (L) 12/15/2016   LDLCALC 65 08/16/2009   LDLDIRECT 54.0 12/15/2016   TRIG (H) 12/15/2016    513.0 Triglyceride is over 400; calculations on Lipids are invalid.   CHOLHDL 5 12/15/2016  On Lipitor  40, fenofibrate 54 - last eye exam was in 05/2017: + DR -+ Numbness and tingling in her toes  She has urinary incontinence >> started Myrbetriq >> developed A fib >> started Eloquis >> had bleeding >> received blood transfusion.  She also has postsurgical (1960s) and postablative (1977 and 1999) hypothyroidism and is on levothyroxine 112 mcg daily: - in am - fasting - Approximately 2 hours from b'fast <<at last visit, she was given levothyroxine at the start of breakfast! - no Ca, Fe, MVI, PPIs.  She is given Zantac with breakfast. - We stopped biotin at last visit  Last TSH was normal in 2018: Lab Results  Component Value Date   TSH 1.82 12/15/2016   She has anemia and Thrombocytopenia and is occasionally getting platelet infusions.   ROS: Constitutional: no weight gain/no weight  loss, no fatigue, no subjective hyperthermia, no subjective hypothermia Eyes: no blurry vision, no xerophthalmia ENT: no sore throat, no nodules palpated in throat, no dysphagia, no odynophagia, no hoarseness Cardiovascular: no CP/+ exertional SOB/+ palpitations/no leg swelling Respiratory: no cough/+ exertional SOB/no wheezing Gastrointestinal: no N/no V/no D/no C/no acid reflux Musculoskeletal: no muscle aches/no joint aches Skin: no rashes, no hair loss Neurological: no tremors/+ numbness/+ tingling/no dizziness  I reviewed pt's medications, allergies, PMH, social hx, family hx, and changes were documented in the history of present illness. Otherwise, unchanged from my initial visit note.  Past Medical History:  Diagnosis Date  . Allergic rhinitis   . Anxiety state, unspecified    panic attacks  . CHF (congestive heart failure) ([place])   . Depressive disorder, not elsewhere classified   . Extrinsic asthma, unspecified    no problem since adulthood  . Obesity   . OSA on CPAP    severe  . Pure hypercholesterolemia   . Respiratory failure with hypoxia ([place]) 09/2008   acute, secondary to multiple bilateral pulmonary embolism , negative hypercoagulable workup 09/2008 hospital stay  . Scoliosis   . Syncope and collapse   . Type II or unspecified type diabetes mellitus without mention of complication, not stated as uncontrolled   . Unspecified essential hypertension   . Unspecified hypothyroidism    hypo   Past Surgical History:  Procedure Laterality Date  . EYE SURGERY    . JOINT REPLACEMENT    . knee replaced    . THYROID SURGERY     Social History   Social History  . Marital status: Widowed    Spouse name: N/A  . Number of children: 2   Occupational History  . 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 Smoking status: Former Smoker    Years: 20.00    Quit date: 09/07/1989  . Smokeless tobacco: Never Used  . Alcohol use No   . Drug use: No   Current Outpatient Medications on File Prior to Visit  Medication Sig Dispense Refill  . amlodipine-atorvastatin (CADUET) 2.5-10 MG tablet Take 1 tablet by mouth daily. 1 tab @@ bedtime.    Marland Kitchen atorvastatin (LIPITOR) 40 MG tablet Take 40 mg by mouth daily.    . Calcium Carbonate-Vit D-Min (CALCIUM 1200 PO) Take 1 tablet by mouth daily.    . Cholecalciferol (VITAMIN D3) 2000 units TABS Take 2,000 Units by mouth daily.    . Coenzyme Q10 200 MG capsule Take 200 mg by mouth daily.      Marland Kitchen docusate sodium (COLACE) 100 MG capsule Take 1 capsule by  mouth daily.    . fenofibrate 54 MG tablet TAKE 1 TABLET(54 MG) BY MOUTH DAILY 90 tablet 1  . fexofenadine (ALLEGRA) 180 MG tablet Take 180 mg by mouth daily.      . furosemide (LASIX) 20 MG tablet Take 2 tablets (40 mg total) by mouth daily as needed. 90 tablet 1  . glipiZIDE (GLUCOTROL) 10 MG tablet Take 1 tablet (10 mg total) by mouth 2 (two) times daily before a meal. 180 tablet 1  . insulin aspart (NOVOLOG) 100 UNIT/ML injection Inject 25-30 Units into the skin 3 (three) times daily before meals. 3 vial 3  . insulin glargine (LANTUS) 100 UNIT/ML injection Inject 0.7 mLs (70 Units total) into the skin daily. 30 mL 3  . Insulin Pen Needle (B-D UF III MINI PEN NEEDLES) 31G X 5 MM MISC Use daily for insulin injection 100 each 2  . Insulin Syringe-Needle U-100 (INSULIN SYRINGE 1CC/30GX5/16") 30G X 5/16" 1 ML MISC USE BID UTD  11  . levothyroxine (SYNTHROID, LEVOTHROID) 112 MCG tablet TAKE 1 TABLET BY MOUTH DAILY 90 tablet 0  . Multiple Vitamin (MULTIVITAMIN) tablet Take 1 tablet by mouth daily.      . potassium chloride SA (K-DUR,KLOR-CON) 20 MEQ tablet Take 20 mEq by mouth 2 (two) times daily.    . potassium citrate (UROCIT-K) 10 MEQ (1080 MG) SR tablet TK 2 TABLETS PO BID WITH MEALS  4  . Probiotic Product (PROBIOTIC PO) Take 1 capsule by mouth daily.    . ranitidine (ZANTAC) 150 MG tablet Take 150 mg by mouth daily as needed for  heartburn.    . sertraline (ZOLOFT) 50 MG tablet Take 1 tablet (50 mg total) daily by mouth. 90 tablet 1  . vitamin C (ASCORBIC ACID) 250 MG tablet Take 250 mg by mouth daily.     No current facility-administered medications on file prior to visit.    Allergies  Allergen Reactions  . Apixaban Other (See Comments)    Internal Bleeding  Patient states internal bleeding.  . Aspirin Itching, Rash, Hives and Swelling    Swelling of her tongue  . Mirabegron Other (See Comments)    Patient experienced A-Fib  . Metformin Diarrhea and Nausea Only  . Pineapple Swelling    Throat swells and blisters on tongue and roof of mouth per patient  . Tetracycline Hives  . Fluticasone-Salmeterol Itching and Rash  . Iodinated Diagnostic Agents Itching and Rash       . Lactose Intolerance (Gi) Other (See Comments)    gas  . Latex Itching, Rash and Other (See Comments)    Pt. States latex pulls her skin off.   . Lisinopril Cough  . Metronidazole Other (See Comments)    Unknown   . Sulfa Antibiotics Rash  . Sulfonamide Derivatives Itching and Rash   Family History  Problem Relation Age of Onset  . Allergies Mother   . Clotting disorder Mother   . Osteoarthritis Mother   . Asthma Mother   . Arthritis Other   . Diabetes Other   . Hyperlipidemia Other   . Hypertension Other   . Coronary artery disease Other   . Stroke Other   . Osteoarthritis Daughter   . Rheum arthritis Maternal Grandmother   . Clotting disorder Maternal Grandmother   . Clotting disorder Maternal Uncle   . Clotting disorder Daughter   . Allergies Daughter   . Breast cancer Neg Hx    PE: BP 118/74   Pulse (!) 124  Ht 5\' 1"  (1.549 m)   Wt 252 lb 6.4 oz (114.5 kg)   LMP  (LMP Unknown)   SpO2 95%   BMI 47.69 kg/m  Wt Readings from Last 3 Encounters:  05/19/18 252 lb 6.4 oz (114.5 kg)  04/11/18 252 lb (114.3 kg)  04/08/18 251 lb (113.9 kg)   Constitutional: overweight, in NAD, uses a walker Eyes: PERRLA, EOMI,  no exophthalmos ENT: moist mucous membranes, no thyromegaly, no cervical lymphadenopathy Cardiovascular: Tachycardia, RR, No RG, +1/6 SEM, + bilateral leg swelling, pitting Respiratory: CTA B Gastrointestinal: abdomen soft, NT, ND, BS+ Musculoskeletal: no deformities, strength intact in all 4 Skin: moist, warm, no rashes Neurological: no tremor with outstretched hands, DTR normal in all 4  ASSESSMENT: 1. DM2, insulin-dependent, uncontrolled, with complications - CKD stage 4 - CHF - + DR  2. Obesity class 3  3.  Hypothyroidism  4. Hyperlipidemia  5.  Tachycardia  PLAN:  1. Patient with long-standing, uncontrolled, type 2 diabetes, insulin-dependent, previously on a premixed insulin regimen and glipizide, with much higher sugars while in [facility] facility before last visit.  Since then, we increase her insulin doses and also her glipizide.  At last visit, sugars were uniformly high and we increase her Lantus and NovoLog even further.  I also recommended a freestyle libre CGM. - At this visit, sugars have improved compared to last visit.  Even though it it is not 3 months since last check, we decided to check an HbA1c and this returned definitely improved, at 7.4%. - However, since sugars are still above target, and mostly in the 200s, we will increase her Lantus and Humalog at this visit - We will continue the glipizide. - I suggested to:  Patient Instructions  Please continue - Glipizide 10 mg 2x a day before meals  Please increase: - Lantus to 80 units at bedtime - Humalog  35 units before breakfast 30 units before lunch 35 units before dinner  Please continue Levothyroxine 112 mcg daily.  Take the thyroid hormone every day, with water, at least 30 minutes before breakfast, separated by at least 4 hours from: - acid reflux medications - calcium - iron - multivitamins  Please return in 3 months with your sugar log.  - continue checking sugars at different times  of the day - check 3-4x a day, rotating checks - advised for yearly eye exams >> she is UTD - Return to clinic in 3 mo with sugar log     2. Obesity class 3 - She was previously on a plant-based diet.  However, she did not continue with this and started to gain weight.  Since last visit, though, weight is stable  3. HL - Reviewed latest lipid panel from 12/2016: LDL at goal, triglycerides high Lab Results  Component Value Date   CHOL 166 12/15/2016   HDL 30.50 (L) 12/15/2016   LDLCALC 65 08/16/2009   LDLDIRECT 54.0 12/15/2016   TRIG (H) 12/15/2016    513.0 Triglyceride is over 400; calculations on Lipids are invalid.   CHOLHDL 5 12/15/2016  - Continues the statin without side effects. - We will check a new lipid panel now.  She is not fasting though.  4.  Hypothyroidism  - latest thyroid labs reviewed with pt >> normal  - she continues on LT4 112 mcg daily - pt feels good on this dose. - we discussed about taking the thyroid hormone every day, with water, >30 minutes before breakfast, separated by >4 hours from  acid reflux medications, calcium, iron, multivitamins. Pt. is taking it correctly, except she gets Zantac 2 hours after breakfast.  We stopped biotin at last visit. - will check thyroid tests today: TSH and fT4 - If labs are abnormal, she will need to return for repeat TFTs in 1.5 months  5.  Tachycardia - Pulse appears regular.  She is not symptomatic. - Advised her to call her cardiologist and let them know about this.  Today, pulse is 124 the beginning of the visit.  Component     Latest Ref Rng & Units 05/19/2018  Cholesterol     0 - 200 mg/dL 131  Triglycerides     0.0 - 149.0 mg/dL 656.0 Triglyceride is over 400; calculations on Lipids are invalid. (H)  HDL Cholesterol     >39.00 mg/dL 26.70 (L)  Total CHOL/HDL Ratio      5  TSH     0.35 - 4.50 uIU/mL 0.92  Direct LDL     mg/dL 41.0  T4,Free(Direct)     0.60 - 1.60 ng/dL 0.73   Thyroid tests are  normal. Triglycerides are still very high.  Will suggest to start fish oil or flaxseed oil  1000 milligrams twice a day.  Philemon Kingdom, MD PhD [facility] Endocrinology

## 2018-05-19 NOTE — Patient Instructions (Addendum)
Please continue - Glipizide 10 mg 2x a day before meals  Please increase: - Lantus to 80 units at bedtime - Humalog  35 units before breakfast 30 units before lunch 35 units before dinner  Please continue Levothyroxine 112 mcg daily.  Take the thyroid hormone every day, with water, at least 30 minutes before breakfast, separated by at least 4 hours from: - acid reflux medications - calcium - iron - multivitamins  Please return in 3 months with your sugar log.

## 2018-05-20 ENCOUNTER — Inpatient Hospital Stay (HOSPITAL_BASED_OUTPATIENT_CLINIC_OR_DEPARTMENT_OTHER): Payer: Medicare Other | Admitting: Family

## 2018-05-20 ENCOUNTER — Encounter: Payer: Self-pay | Admitting: Family

## 2018-05-20 ENCOUNTER — Other Ambulatory Visit: Payer: Self-pay

## 2018-05-20 ENCOUNTER — Inpatient Hospital Stay: Payer: Medicare Other

## 2018-05-20 ENCOUNTER — Telehealth: Payer: Self-pay | Admitting: Internal Medicine

## 2018-05-20 VITALS — BP 117/58 | HR 123 | Temp 98.6°F | Resp 19 | Wt 252.0 lb

## 2018-05-20 DIAGNOSIS — D509 Iron deficiency anemia, unspecified: Secondary | ICD-10-CM | POA: Diagnosis not present

## 2018-05-20 DIAGNOSIS — Z79899 Other long term (current) drug therapy: Secondary | ICD-10-CM

## 2018-05-20 DIAGNOSIS — D696 Thrombocytopenia, unspecified: Secondary | ICD-10-CM

## 2018-05-20 DIAGNOSIS — G629 Polyneuropathy, unspecified: Secondary | ICD-10-CM

## 2018-05-20 DIAGNOSIS — Z86711 Personal history of pulmonary embolism: Secondary | ICD-10-CM

## 2018-05-20 DIAGNOSIS — D693 Immune thrombocytopenic purpura: Secondary | ICD-10-CM | POA: Diagnosis not present

## 2018-05-20 DIAGNOSIS — Z794 Long term (current) use of insulin: Secondary | ICD-10-CM

## 2018-05-20 DIAGNOSIS — D500 Iron deficiency anemia secondary to blood loss (chronic): Secondary | ICD-10-CM

## 2018-05-20 DIAGNOSIS — R000 Tachycardia, unspecified: Secondary | ICD-10-CM

## 2018-05-20 LAB — IRON AND TIBC
Iron: 79 ug/dL (ref 41–142)
Saturation Ratios: 18 % — ABNORMAL LOW (ref 21–57)
TIBC: 438 ug/dL (ref 236–444)
UIBC: 359 ug/dL

## 2018-05-20 LAB — CBC WITH DIFFERENTIAL (CANCER CENTER ONLY)
Basophils Absolute: 0 10*3/uL (ref 0.0–0.1)
Basophils Relative: 0 %
Eosinophils Absolute: 0.2 10*3/uL (ref 0.0–0.5)
Eosinophils Relative: 3 %
HCT: 32.2 % — ABNORMAL LOW (ref 34.8–46.6)
Hemoglobin: 10.4 g/dL — ABNORMAL LOW (ref 11.6–15.9)
Lymphocytes Relative: 19 %
Lymphs Abs: 1.1 10*3/uL (ref 0.9–3.3)
MCH: 31.1 pg (ref 26.0–34.0)
MCHC: 32.3 g/dL (ref 32.0–36.0)
MCV: 96.4 fL (ref 81.0–101.0)
Monocytes Absolute: 0.4 10*3/uL (ref 0.1–0.9)
Monocytes Relative: 7 %
Neutro Abs: 3.9 10*3/uL (ref 1.5–6.5)
Neutrophils Relative %: 71 %
Platelet Count: 121 10*3/uL — ABNORMAL LOW (ref 145–400)
RBC: 3.34 MIL/uL — ABNORMAL LOW (ref 3.70–5.32)
RDW: 18.7 % — ABNORMAL HIGH (ref 11.1–15.7)
WBC Count: 5.6 10*3/uL (ref 3.9–10.0)

## 2018-05-20 LAB — CMP (CANCER CENTER ONLY)
ALT: 14 U/L (ref 0–44)
AST: 23 U/L (ref 15–41)
Albumin: 4.1 g/dL (ref 3.5–5.0)
Alkaline Phosphatase: 39 U/L (ref 38–126)
Anion gap: 12 (ref 5–15)
BUN: 35 mg/dL — ABNORMAL HIGH (ref 8–23)
CO2: 29 mmol/L (ref 22–32)
Calcium: 9.7 mg/dL (ref 8.9–10.3)
Chloride: 102 mmol/L (ref 98–111)
Creatinine: 2.29 mg/dL — ABNORMAL HIGH (ref 0.44–1.00)
GFR, Est AFR Am: 22 mL/min — ABNORMAL LOW (ref >60)
GFR, Estimated: 19 mL/min — ABNORMAL LOW (ref >60)
Glucose, Bld: 273 mg/dL — ABNORMAL HIGH (ref 70–99)
Potassium: 4.9 mmol/L (ref 3.5–5.1)
Sodium: 143 mmol/L (ref 135–145)
Total Bilirubin: 0.6 mg/dL (ref 0.3–1.2)
Total Protein: 7.8 g/dL (ref 6.5–8.1)

## 2018-05-20 LAB — FERRITIN: Ferritin: 42 ng/mL (ref 11–307)

## 2018-05-20 MED ORDER — ROMIPLOSTIM INJECTION 500 MCG
570.0000 ug | Freq: Once | SUBCUTANEOUS | Status: AC
  Administered 2018-05-20: 570 ug via SUBCUTANEOUS
  Filled 2018-05-20: qty 1.14

## 2018-05-20 NOTE — Patient Instructions (Signed)
Romiplostim injection What is this medicine? ROMIPLOSTIM (roe mi PLOE stim) helps your body make more platelets. This medicine is used to treat low platelets caused by chronic idiopathic thrombocytopenic purpura (ITP). This medicine may be used for other purposes; ask your health care provider or pharmacist if you have questions. COMMON BRAND NAME(S): Nplate What should I tell my health care provider before I take this medicine? They need to know if you have any of these conditions: -cancer or myelodysplastic syndrome -low blood counts, like low white cell, platelet, or red cell counts -take medicines that treat or prevent blood clots -an unusual or allergic reaction to romiplostim, mannitol, other medicines, foods, dyes, or preservatives -pregnant or trying to get pregnant -breast-feeding How should I use this medicine? This medicine is for injection under the skin. It is given by a health care professional in a hospital or clinic setting. A special MedGuide will be given to you before your injection. Read this information carefully each time. Talk to your pediatrician regarding the use of this medicine in children. Special care may be needed. Overdosage: If you think you have taken too much of this medicine contact a poison control center or emergency room at once. NOTE: This medicine is only for you. Do not share this medicine with others. What if I miss a dose? It is important not to miss your dose. Call your doctor or health care professional if you are unable to keep an appointment. What may interact with this medicine? Interactions are not expected. This list may not describe all possible interactions. Give your health care provider a list of all the medicines, herbs, non-prescription drugs, or dietary supplements you use. Also tell them if you smoke, drink alcohol, or use illegal drugs. Some items may interact with your medicine. What should I watch for while using this  medicine? Your condition will be monitored carefully while you are receiving this medicine. Visit your prescriber or health care professional for regular checks on your progress and for the needed blood tests. It is important to keep all appointments. What side effects may I notice from receiving this medicine? Side effects that you should report to your doctor or health care professional as soon as possible: -allergic reactions like skin rash, itching or hives, swelling of the face, lips, or tongue -shortness of breath, chest pain, swelling in a leg -unusual bleeding or bruising Side effects that usually do not require medical attention (report to your doctor or health care professional if they continue or are bothersome): -dizziness -headache -muscle aches -pain in arms and legs -stomach pain -trouble sleeping This list may not describe all possible side effects. Call your doctor for medical advice about side effects. You may report side effects to FDA at 1-800-FDA-1088. Where should I keep my medicine? This drug is given in a hospital or clinic and will not be stored at home. NOTE: This sheet is a summary. It may not cover all possible information. If you have questions about this medicine, talk to your doctor, pharmacist, or health care provider.  2018 Elsevier/Gold Standard (2008-04-23 15:13:04)  

## 2018-05-20 NOTE — Telephone Encounter (Signed)
Patient states she is returning call to [place].  Courtney not available at time of call.  Please return call to patient at [phone].

## 2018-05-20 NOTE — Progress Notes (Signed)
Hematology and Oncology Follow Up Visit  [name] [id number] [birth year] 83 y.o. 05/20/2018   Principle Diagnosis:  Immune based thrombocytopenia History of PE Iron deficiency anemia  Current Therapy:   Nplate as indicated IV Iron as indicated - dose given on 07/07/2017   Interim History:  [name] is here today for follow-up and Nplate. Her platelet count today is 121.  No episodes of bleeding. She states that her easy bruising seems to be improved.  Her heart rate is elevated at 123, EKG showed sinus tach at 118. She is not symptomatic at this time. She plans to contact her cardiologist for follow-up.  She is in PT and doing well. She will have episodes of SOB with over exertion and takes a break to rest when needed.  She saw her endocrinologist yesterday and TSH was stable. She is monitoring her blood sugars  She sees her nephrologist next week for follow-up.   No fever, chills, n/v, cough, rash, dizziness, chest pain, palpitations, abdominal pain or changes in bowel or bladder habits.  The puffiness in her feet and ankles are stable. The neuropathy in the two smallest toes on her right foot is unchanged. No c/o pain.  She has a good appetite and is staying well hydrated. Her weight is stable.   ECOG Performance Status: 1 - Symptomatic but completely ambulatory  Medications:  Allergies as of 05/20/2018      Reactions   Apixaban Other (See Comments)   Internal Bleeding Patient states internal bleeding.   Aspirin Itching, Rash, Hives, Swelling   Swelling of her tongue   Mirabegron Other (See Comments)   Patient experienced A-Fib   Metformin Diarrhea, Nausea Only   Pineapple Swelling   Throat swells and blisters on tongue and roof of mouth per patient   Tetracycline Hives   Fluticasone-salmeterol Itching, Rash   Iodinated Diagnostic Agents Itching, Rash      Lactose Intolerance (gi) Other (See Comments)   gas   Latex Itching, Rash, Other (See Comments)   Pt.  States latex pulls her skin off.    Lisinopril Cough   Metronidazole Other (See Comments)   Unknown   Sulfa Antibiotics Rash   Sulfonamide Derivatives Itching, Rash      Medication List        Accurate as of 05/20/18  1:26 PM. Always use your most recent med list.          amlodipine-atorvastatin 2.5-10 MG tablet Commonly known as:  CADUET Take 1 tablet by mouth daily. 1 tab @@ bedtime.   atorvastatin 40 MG tablet Commonly known as:  LIPITOR Take 40 mg by mouth daily.   CALCIUM 1200 PO Take 1 tablet by mouth daily.   Coenzyme Q10 200 MG capsule Take 200 mg by mouth daily.   docusate sodium 100 MG capsule Commonly known as:  COLACE Take 1 capsule by mouth daily.   fenofibrate 54 MG tablet TAKE 1 TABLET(54 MG) BY MOUTH DAILY   fexofenadine 180 MG tablet Commonly known as:  ALLEGRA Take 180 mg by mouth daily.   furosemide 20 MG tablet Commonly known as:  LASIX Take 2 tablets (40 mg total) by mouth daily as needed.   glipiZIDE 10 MG tablet Commonly known as:  GLUCOTROL Take 1 tablet (10 mg total) by mouth 2 (two) times daily before a meal.   insulin aspart 100 UNIT/ML injection Commonly known as:  novoLOG Inject 30-35 Units into the skin 3 (three) times daily before meals.  insulin glargine 100 UNIT/ML injection Commonly known as:  LANTUS Inject 0.8 mLs (80 Units total) into the skin daily.   Insulin Pen Needle 31G X 5 MM Misc Use daily for insulin injection   INSULIN SYRINGE 1CC/30GX5/16" 30G X 5/16" 1 ML Misc USE BID UTD   levothyroxine 112 MCG tablet Commonly known as:  SYNTHROID, LEVOTHROID TAKE 1 TABLET BY MOUTH DAILY   multivitamin tablet Take 1 tablet by mouth daily.   potassium chloride SA 20 MEQ tablet Commonly known as:  K-DUR,KLOR-CON Take 20 mEq by mouth 2 (two) times daily.   potassium citrate 10 MEQ (1080 MG) SR tablet Commonly known as:  UROCIT-K TK 2 TABLETS PO BID WITH MEALS   PROBIOTIC PO Take 1 capsule by mouth daily.     ranitidine 150 MG tablet Commonly known as:  ZANTAC Take 150 mg by mouth daily as needed for heartburn.   sertraline 50 MG tablet Commonly known as:  ZOLOFT Take 1 tablet (50 mg total) daily by mouth.   vitamin C 250 MG tablet Commonly known as:  ASCORBIC ACID Take 250 mg by mouth daily.   Vitamin D3 2000 units Tabs Take 2,000 Units by mouth daily.       Allergies:  Allergies  Allergen Reactions  . Apixaban Other (See Comments)    Internal Bleeding  Patient states internal bleeding.  . Aspirin Itching, Rash, Hives and Swelling    Swelling of her tongue  . Mirabegron Other (See Comments)    Patient experienced A-Fib  . Metformin Diarrhea and Nausea Only  . Pineapple Swelling    Throat swells and blisters on tongue and roof of mouth per patient  . Tetracycline Hives  . Fluticasone-Salmeterol Itching and Rash  . Iodinated Diagnostic Agents Itching and Rash       . Lactose Intolerance (Gi) Other (See Comments)    gas  . Latex Itching, Rash and Other (See Comments)    Pt. States latex pulls her skin off.   . Lisinopril Cough  . Metronidazole Other (See Comments)    Unknown   . Sulfa Antibiotics Rash  . Sulfonamide Derivatives Itching and Rash    Past Medical History, Surgical history, Social history, and Family History were reviewed and updated.  Review of Systems: All other 10 point review of systems is negative.   Physical Exam:  vitals were not taken for this visit.   Wt Readings from Last 3 Encounters:  05/19/18 252 lb 6.4 oz (114.5 kg)  04/11/18 252 lb (114.3 kg)  04/08/18 251 lb (113.9 kg)    Ocular: Sclerae unicteric, pupils equal, round and reactive to light Ear-nose-throat: Oropharynx clear, dentition fair Lymphatic: No cervical, supraclavicular or axillary adenopathy Lungs no rales or rhonchi, good excursion bilaterally Heart regular rate and rhythm, no murmur appreciated Abd soft, nontender, positive bowel sounds, no liver or spleen tip  palpated on exam, no fluid wave  MSK no focal spinal tenderness, no joint edema Neuro: non-focal, well-oriented, appropriate affect Breasts: Deferred   Lab Results  Component Value Date   WBC 5.6 05/20/2018   HGB 10.4 (L) 05/20/2018   HCT 32.2 (L) 05/20/2018   MCV 96.4 05/20/2018   PLT 121 (L) 05/20/2018   Lab Results  Component Value Date   FERRITIN 67 01/21/2018   IRON 146 (H) 01/21/2018   TIBC 400 01/21/2018   UIBC 254 01/21/2018   IRONPCTSAT 37 01/21/2018   Lab Results  Component Value Date   RETICCTPCT 1.9 01/28/2011   RBC  3.34 (L) 05/20/2018   RETICCTABS 73.3 01/28/2011   No results found for: KPAFRELGTCHN, LAMBDASER, KAPLAMBRATIO No results found for: Kandis Cocking, IGMSERUM No results found for: Odetta Pink, SPEI   Chemistry      Component Value Date/Time   NA 143 05/13/2018 1407   NA 145 09/08/2017 1317   NA 142 02/25/2017 1013   K 4.4 05/13/2018 1407   K 4.5 09/08/2017 1317   K 3.8 02/25/2017 1013   CL 106 05/13/2018 1407   CL 98 09/08/2017 1317   CO2 30 05/13/2018 1407   CO2 32 09/08/2017 1317   CO2 26 02/25/2017 1013   BUN 34 (H) 05/13/2018 1407   BUN 34 (H) 09/08/2017 1317   BUN 20.1 02/25/2017 1013   CREATININE 2.20 (H) 05/13/2018 1407   CREATININE 1.9 (H) 09/08/2017 1317   CREATININE 1.8 (H) 02/25/2017 1013      Component Value Date/Time   CALCIUM 9.7 05/13/2018 1407   CALCIUM 9.3 09/08/2017 1317   CALCIUM 8.7 02/25/2017 1013   ALKPHOS 40 05/13/2018 1407   ALKPHOS 45 09/08/2017 1317   ALKPHOS 65 02/25/2017 1013   AST 31 05/13/2018 1407   AST 16 02/25/2017 1013   ALT 27 05/13/2018 1407   ALT 26 09/08/2017 1317   ALT 13 02/25/2017 1013   BILITOT 0.8 05/13/2018 1407   BILITOT 0.76 02/25/2017 1013      Impression and Plan: [name] is a very pleasant 83 yo caucasian female with immune based thrombocytopenia. She continues to do well and her platelet count is now up to 121.  She was  given Nplate today.  She states that she will follow-up with cardiology regarding her sinus tach.  We will plan to see her back every 3 weeks for lab and injection and follow-up in 6 weeks.  She will contact our office with any questions or concerns. We can certainly see her sooner if need be.   Laverna Peace, NP 9/13/20191:26 PM

## 2018-05-23 ENCOUNTER — Telehealth: Payer: Self-pay | Admitting: Internal Medicine

## 2018-05-23 ENCOUNTER — Telehealth: Payer: Self-pay | Admitting: Cardiology

## 2018-05-23 NOTE — Telephone Encounter (Signed)
[facility] Asst [facility] ph# [phone] called re: Patient said that someone from our office told patient that she needs to start taking Flax Oil. [facility] needs Order for medication, dosage. Please fax order to # [phone]

## 2018-05-23 NOTE — Telephone Encounter (Signed)
Called patient and made her an appointment tomorrow with Dr. Marlou Porch.

## 2018-05-23 NOTE — Telephone Encounter (Signed)
Order faxed to [facility]

## 2018-05-23 NOTE — Telephone Encounter (Signed)
Hematologist did EKG at office on 05/20/18, HR then 123. Today HR 108.  Patient lives at [place] at [facility]. Patient stated they are checking her vitals signs regularly. Patient is concerned about her HR being elevated and stated last time this happened, she was started on eliquis and had a GI bleed, eliquis was stopped. At this time patient is asymptomatic. Encouraged patient to reduce her caffeine intake, that this could help.  Informed patient that a message would be sent to Dr. Marlou Porch for further advisement. Patient has an appointment on 06/09/18 with Dr. Marlou Porch.

## 2018-05-23 NOTE — Telephone Encounter (Signed)
Lets see if we can get her in tomorrow  Thanks [name], MD

## 2018-05-23 NOTE — Telephone Encounter (Signed)
New Message:     Patient is requesting a call back concerning her increase heart rate.

## 2018-05-24 ENCOUNTER — Telehealth: Payer: Self-pay

## 2018-05-24 ENCOUNTER — Encounter: Payer: Self-pay | Admitting: Cardiology

## 2018-05-24 ENCOUNTER — Ambulatory Visit (INDEPENDENT_AMBULATORY_CARE_PROVIDER_SITE_OTHER): Payer: Medicare Other | Admitting: Cardiology

## 2018-05-24 VITALS — BP 128/72 | HR 123 | Ht 61.0 in | Wt 251.8 lb

## 2018-05-24 DIAGNOSIS — I5032 Chronic diastolic (congestive) heart failure: Secondary | ICD-10-CM

## 2018-05-24 DIAGNOSIS — I480 Paroxysmal atrial fibrillation: Secondary | ICD-10-CM

## 2018-05-24 DIAGNOSIS — I10 Essential (primary) hypertension: Secondary | ICD-10-CM

## 2018-05-24 MED ORDER — DILTIAZEM HCL ER 120 MG PO CP24: 120.0000 mg | ORAL_CAPSULE | Freq: Every day | ORAL | 3 refills | Status: DC

## 2018-05-24 NOTE — Progress Notes (Signed)
Cardiology Office Note    Date:  05/24/2018   ID:  [name], DOB September 13, 1934, MRN [id number]  PCP:  Patient, No Pcp Per  Cardiologist:   [name], MD   No chief complaint on file.   History of Present Illness:  [name] is a 83 y.o. female here for Follow-up of paroxysmal atrial fibrillation, anticoagulation and diastolic heart failure.   She had melena, GI bleed and needed a blood transfusion and was seen last by Dr. Oletta Lamas of gastroenterology on 04/29/17. She had seen Dr. Lutricia Feil in the past because of prompt cytopenia. Hemoglobin was 6.6. Had EGD and colonoscopy. Getting iron infusions.  No further bleeding after stopping the Eliquis. She also had concomitant thrombocytopenia as well and her platelet counts have decreased down to 29,000.  She has had an issue with mild thrombocytopenia in the past. She has a prior history of pulmonary embolism as well.  05/24/18 - CC: follow up of AFIB. Today in AFlutter 2:1 or A tach. 123 there and here. Doing PT. Running out of breath.  Has not been able to take anticoagulation because of previous severe bleeding.  She also has had bradycardia 49 bpm on prior EKG.  Tachybradycardia syndrome.  Challenging situation.  Denies any syncope, chest pain, recurrent bleeding, orthopnea, PND.  Past Medical History:  Diagnosis Date  . Allergic rhinitis   . Anxiety state, unspecified    panic attacks  . CHF (congestive heart failure) ([place])   . Depressive disorder, not elsewhere classified   . Extrinsic asthma, unspecified    no problem since adulthood  . Obesity   . OSA on CPAP    severe  . Pure hypercholesterolemia   . Respiratory failure with hypoxia ([place]) 09/2008   acute, secondary to multiple bilateral pulmonary embolism , negative hypercoagulable workup 09/2008 hospital stay  . Scoliosis   . Syncope and collapse   . Type II or unspecified type diabetes mellitus without mention of complication, not stated as uncontrolled   .  Unspecified essential hypertension   . Unspecified hypothyroidism    hypo    Past Surgical History:  Procedure Laterality Date  . EYE SURGERY    . JOINT REPLACEMENT    . knee replaced    . THYROID SURGERY      Outpatient Medications Prior to Visit  Medication Sig Dispense Refill  . acetaminophen (TYLENOL) 325 MG tablet Take 650 mg by mouth every 6 (six) hours as needed.    Marland Kitchen atorvastatin (LIPITOR) 40 MG tablet Take 40 mg by mouth daily.    . Calcium Carbonate-Vit D-Min (CALCIUM 1200 PO) Take 1 tablet by mouth daily.    . cephALEXin (KEFLEX) 500 MG capsule Take 500 mg by mouth 4 (four) times daily.    . Cholecalciferol (VITAMIN D3) 2000 units TABS Take 2,000 Units by mouth daily.    . Coenzyme Q10 200 MG capsule Take 200 mg by mouth daily.      . diphenhydrAMINE (BANOPHEN) 25 MG tablet Take 25 mg by mouth daily.    Marland Kitchen docusate sodium (COLACE) 100 MG capsule Take 1 capsule by mouth daily.    . fenofibrate 54 MG tablet TAKE 1 TABLET(54 MG) BY MOUTH DAILY 90 tablet 1  . fexofenadine (ALLEGRA) 180 MG tablet Take 180 mg by mouth daily.      . furosemide (LASIX) 20 MG tablet Take 2 tablets (40 mg total) by mouth daily as needed. 90 tablet 1  . glipiZIDE (GLUCOTROL) 10 MG  tablet Take 1 tablet (10 mg total) by mouth 2 (two) times daily before a meal. 180 tablet 1  . insulin glargine (LANTUS) 100 UNIT/ML injection Inject 0.8 mLs (80 Units total) into the skin daily. 30 mL 3  . Insulin Lispro (HUMALOG [place]) Inject 30-35 Units into the skin.    . Insulin Pen Needle (B-D UF III MINI PEN NEEDLES) 31G X 5 MM MISC Use daily for insulin injection 100 each 2  . Insulin Syringe-Needle U-100 (INSULIN SYRINGE 1CC/30GX5/16") 30G X 5/16" 1 ML MISC USE BID UTD  11  . levothyroxine (SYNTHROID, LEVOTHROID) 112 MCG tablet TAKE 1 TABLET BY MOUTH DAILY 90 tablet 0  . Multiple Vitamin (MULTIVITAMIN) tablet Take 1 tablet by mouth daily.      . potassium citrate (UROCIT-K) 10 MEQ (1080 MG) SR tablet TK 2 TABLETS PO  BID WITH MEALS  4  . Probiotic Product (PROBIOTIC PO) Take 1 capsule by mouth daily.    . ranitidine (ZANTAC) 150 MG tablet Take 150 mg by mouth daily as needed for heartburn.    . sertraline (ZOLOFT) 50 MG tablet Take 1 tablet (50 mg total) daily by mouth. (Patient taking differently: Take 100 mg by mouth daily. ) 90 tablet 1  . vitamin C (ASCORBIC ACID) 250 MG tablet Take 250 mg by mouth daily.    Marland Kitchen amlodipine-atorvastatin (CADUET) 2.5-10 MG tablet Take 1 tablet by mouth daily. 1 tab @@ bedtime.    . potassium chloride SA (K-DUR,KLOR-CON) 20 MEQ tablet Take 20 mEq by mouth 2 (two) times daily.     No facility-administered medications prior to visit.      Allergies:   Apixaban; Aspirin; Mirabegron; Metformin; Pineapple; Tetracycline; Fluticasone-salmeterol; Iodinated diagnostic agents; Lactose intolerance (gi); Latex; Lisinopril; Metronidazole; Sulfa antibiotics; and Sulfonamide derivatives   Social History   Socioeconomic History  . Marital status: Widowed    Spouse name: Not on file  . Number of children: 2  . Years of education: Not on file  . Highest education level: Not on file  Occupational History  . Occupation: OWNER    Employer: [employer]: Self employed- runs Advertising copywriter  Social Needs  . Financial resource strain: Not on file  . Food insecurity:    Worry: Not on file    Inability: Not on file  . Transportation needs:    Medical: Not on file    Non-medical: Not on file  Tobacco Use  . Smoking status: Former Smoker    Years: 20.00    Last attempt to quit: 09/07/1989    Years since quitting: 28.7  . Smokeless tobacco: Never Used  Substance and Sexual Activity  . Alcohol use: No  . Drug use: No  . Sexual activity: Not on file  Lifestyle  . Physical activity:    Days per week: Not on file    Minutes per session: Not on file  . Stress: Not on file  Relationships  . Social connections:    Talks on phone: Not on file    Gets together: Not on file     Attends religious service: Not on file    Active member of club or organization: Not on file    Attends meetings of clubs or organizations: Not on file    Relationship status: Not on file  Other Topics Concern  . Not on file  Social History Narrative  . Not on file     Family History:  The patient's family history includes  Allergies in her daughter and mother; Arthritis in her other; Asthma in her mother; Clotting disorder in her daughter, maternal grandmother, maternal uncle, and mother; Coronary artery disease in her other; Diabetes in her other; Hyperlipidemia in her other; Hypertension in her other; Osteoarthritis in her daughter and mother; Rheum arthritis in her maternal grandmother; Stroke in her other.   ROS:   Please see the history of present illness.    Review of Systems  All other systems reviewed and are negative.    PHYSICAL EXAM:   VS:  BP 128/72   Pulse (!) 123   Ht 5\' 1"  (1.549 m)   Wt 251 lb 12.8 oz (114.2 kg)   LMP  (LMP Unknown)   SpO2 97%   BMI 47.58 kg/m    GEN: Well nourished, well developed, in no acute distress obese HEENT: normal  Neck: no JVD, carotid bruits, or masses Cardiac: reg tachy; no murmurs, rubs, or gallops,no edema  Respiratory:  clear to auscultation bilaterally, normal work of breathing GI: soft, nontender, nondistended, + BS MS: no deformity or atrophy  Skin: warm and dry, no rash, no bruising noted Neuro:  Alert and Oriented x 3, Strength and sensation are intact Psych: euthymic mood, full affect   Wt Readings from Last 3 Encounters:  05/24/18 251 lb 12.8 oz (114.2 kg)  05/20/18 252 lb (114.3 kg)  05/19/18 252 lb 6.4 oz (114.5 kg)      Studies/Labs Reviewed:   EKG: 05/24/2018-questionable atrial flutter with 2 1 conduction versus atrial tachycardia heart rate 123 bpm, regular, poor R wave progression.  EKG from 09/2017- bradycardia 49 bpm sinus personally interpreted and reviewed-01/12/17-atrial flutter/fibrillation with variable  conduction heart rate 84 bpm, left axis deviation, baseline artifact. 08/24/15-sinus rhythm, 70, left axis deviation, poor R-wave progression personally viewed. EKG on 02/11/17 showed normal sinus rhythm.  Recent Labs: 05/19/2018: TSH 0.92 05/20/2018: ALT 14; BUN 35; Creatinine 2.29; Hemoglobin 10.4; Platelet Count 121; Potassium 4.9; Sodium 143   Lipid Panel    Component Value Date/Time   CHOL 131 05/19/2018 1140   TRIG (H) 05/19/2018 1140    656.0 Triglyceride is over 400; calculations on Lipids are invalid.   HDL 26.70 (L) 05/19/2018 1140   CHOLHDL 5 05/19/2018 1140   VLDL 118.2 (H) 09/25/2011 1511   LDLCALC 65 08/16/2009 0902   LDLDIRECT 41.0 05/19/2018 1140    Additional studies/ records that were reviewed today include:   08/24/15-CT Angio chest PE-minimal coronary artery calcification of the ostial LAD. Aortic atherosclerosis noted.  Nuclear stress test was performed in October 2016 in [place]. Low risk  Echocardiogram 01/25/17:  - Left ventricle: The cavity size was normal. There was mild   concentric hypertrophy. Systolic function was vigorous. The   estimated ejection fraction was in the range of 65% to 70%. Wall   motion was normal; there were no regional wall motion   abnormalities. Features are consistent with a pseudonormal left   ventricular filling pattern, with concomitant abnormal relaxation   and increased filling pressure (grade 2 diastolic dysfunction).   Doppler parameters are consistent with elevated ventricular   end-diastolic filling pressure. - Aortic valve: Trileaflet; normal thickness leaflets. There was no   regurgitation. - Aortic root: The aortic root was normal in size. - Mitral valve: Calcified annulus. Mildly thickened leaflets .   There was mild regurgitation. - Left atrium: The atrium was moderately dilated. - Right ventricle: The cavity size was normal. Wall thickness was   normal. Systolic function was  normal. - Right atrium: The atrium  was normal in size. - Tricuspid valve: There was no regurgitation. - Pulmonary arteries: Systolic pressure was mildly increased. PA   peak pressure: 35 mm Hg (S). - Inferior vena cava: The vessel was normal in size. - Pericardium, extracardiac: There was no pericardial effusion   ASSESSMENT:    1. Paroxysmal atrial fibrillation (HCC)   2. Essential hypertension   3. Morbid obesity ([place])   4. Chronic diastolic heart failure (HCC)      PLAN:  In order of problems listed above:  Atrial fibrillation/flutter paroxysmal  -Today she appears to be in atrial flutter 2-1 conduction 123 bpm versus atrial tachycardia.  She has had sinus bradycardia as well 49 bpm in the past.  She has had clearly demonstrated coarse atrial fibrillation with variable response in the past as well.  When utilizing Eliquis in the past she had severe bleeding, GI evaluation by Dr. Oletta Lamas.  After stopping the medication this resolved.  My concern is that she has tachybradycardia syndrome, recurrent atrial fibrillation/flutter that is challenging control.  I will send her to EP for further advice evaluation and management.  I have not restarted her anticoagulation because of the severe life-threatening bleeding she had had previously.  I will give her diltiazem 120 mg CD once a day and stop her amlodipine 2.5 mg.   Thrombocytopenia  - She has seen Dr. Katheran Awe who is working on this. Last platelet count was 29,000. She is also getting iron infusions.  At last check her platelets were 97   Chronic diastolic heart failure  - Mostly related to obesity. Echocardiogram as above. Exercise. 1-1.5 L fluid restriction as well as salt restriction. She would like to take her Lasix on an as-needed basis because of her urinary incontinence. She will watch her weights closely.  This is also being exacerbated by her paroxysmal tachycardia.  Morbid obesity  - Weight loss very important. She is on a plant-based diet as prescribed by her  endocrinologist.  No changes.  History of PE  - Last CT scan reassuring.  Chronic kidney disease stage IV  - Dr. Pearson Grippe.   Diabetes with hypertension  - Per primary team.  Continue with current care   Medication Adjustments/Labs and Tests Ordered: Current medicines are reviewed at length with the patient today.  Concerns regarding medicines are outlined above.  Medication changes, Labs and Tests ordered today are listed in the Patient Instructions below. Patient Instructions  Medication Instructions:  Please start Diltiazem 120 mg once daily. Continue all other medications as listed.  Follow-Up: Follow up with electrophysiology for further evaluation and treatment of Atrial Fib .  If you need a refill on your cardiac medications before your next appointment, please call your pharmacy.  Thank you for choosing [facility]!!          Signed, [name], MD  05/24/2018 10:59 AM    [facility] [place], [place], [place]  [address] Phone: [phone]; Fax: [phone]

## 2018-05-24 NOTE — Telephone Encounter (Signed)
Forms from [facility] filled out and faxed.

## 2018-05-24 NOTE — Patient Instructions (Signed)
Medication Instructions:  Please start Diltiazem 120 mg once daily. Continue all other medications as listed.  Follow-Up: Follow up with electrophysiology for further evaluation and treatment of Atrial Fib .  If you need a refill on your cardiac medications before your next appointment, please call your pharmacy.  Thank you for choosing [facility]!!

## 2018-05-26 DIAGNOSIS — I130 Hypertensive heart and chronic kidney disease with heart failure and stage 1 through stage 4 chronic kidney disease, or unspecified chronic kidney disease: Secondary | ICD-10-CM | POA: Diagnosis not present

## 2018-05-26 DIAGNOSIS — I5033 Acute on chronic diastolic (congestive) heart failure: Secondary | ICD-10-CM | POA: Diagnosis not present

## 2018-05-27 DIAGNOSIS — I5033 Acute on chronic diastolic (congestive) heart failure: Secondary | ICD-10-CM | POA: Diagnosis not present

## 2018-05-27 DIAGNOSIS — I130 Hypertensive heart and chronic kidney disease with heart failure and stage 1 through stage 4 chronic kidney disease, or unspecified chronic kidney disease: Secondary | ICD-10-CM | POA: Diagnosis not present

## 2018-05-27 DIAGNOSIS — R2681 Unsteadiness on feet: Secondary | ICD-10-CM | POA: Diagnosis not present

## 2018-05-30 DIAGNOSIS — I130 Hypertensive heart and chronic kidney disease with heart failure and stage 1 through stage 4 chronic kidney disease, or unspecified chronic kidney disease: Secondary | ICD-10-CM | POA: Diagnosis not present

## 2018-05-30 DIAGNOSIS — R2681 Unsteadiness on feet: Secondary | ICD-10-CM | POA: Diagnosis not present

## 2018-05-30 DIAGNOSIS — I5033 Acute on chronic diastolic (congestive) heart failure: Secondary | ICD-10-CM | POA: Diagnosis not present

## 2018-05-31 DIAGNOSIS — I5033 Acute on chronic diastolic (congestive) heart failure: Secondary | ICD-10-CM | POA: Diagnosis not present

## 2018-05-31 DIAGNOSIS — R2681 Unsteadiness on feet: Secondary | ICD-10-CM | POA: Diagnosis not present

## 2018-05-31 DIAGNOSIS — N200 Calculus of kidney: Secondary | ICD-10-CM | POA: Diagnosis not present

## 2018-05-31 DIAGNOSIS — I129 Hypertensive chronic kidney disease with stage 1 through stage 4 chronic kidney disease, or unspecified chronic kidney disease: Secondary | ICD-10-CM | POA: Diagnosis not present

## 2018-05-31 DIAGNOSIS — I130 Hypertensive heart and chronic kidney disease with heart failure and stage 1 through stage 4 chronic kidney disease, or unspecified chronic kidney disease: Secondary | ICD-10-CM | POA: Diagnosis not present

## 2018-05-31 DIAGNOSIS — N183 Chronic kidney disease, stage 3 (moderate): Secondary | ICD-10-CM | POA: Diagnosis not present

## 2018-06-06 ENCOUNTER — Encounter: Payer: Self-pay | Admitting: Internal Medicine

## 2018-06-06 ENCOUNTER — Ambulatory Visit (INDEPENDENT_AMBULATORY_CARE_PROVIDER_SITE_OTHER): Payer: Medicare Other | Admitting: Internal Medicine

## 2018-06-06 VITALS — BP 118/62 | HR 108 | Ht 61.0 in | Wt 252.8 lb

## 2018-06-06 DIAGNOSIS — D696 Thrombocytopenia, unspecified: Secondary | ICD-10-CM

## 2018-06-06 DIAGNOSIS — I480 Paroxysmal atrial fibrillation: Secondary | ICD-10-CM | POA: Diagnosis not present

## 2018-06-06 DIAGNOSIS — I483 Typical atrial flutter: Secondary | ICD-10-CM | POA: Diagnosis not present

## 2018-06-06 MED ORDER — DILTIAZEM HCL ER COATED BEADS 180 MG PO CP24: 180.0000 mg | ORAL_CAPSULE | Freq: Every day | ORAL | 3 refills | Status: DC

## 2018-06-06 NOTE — Progress Notes (Signed)
Electrophysiology Office Note   Date:  06/06/2018   ID:  [name], [name] [birth year], MRN [id number]  PCP:  Javier Glazier, MD  Cardiologist:  Dr Marlou Porch Primary Electrophysiologist: Thompson Grayer, MD    CC: afib   History of Present Illness: [name] is a 83 y.o. female who presents today for electrophysiology evaluation.   She is referred by Dr Marlou Porch for EP consultation regarding afib.  She has a h/o prior afib as well as prior PTE.  She was in [place] PTE study with Dr Joya Gaskins years ago.  She reports having palpitations (likely afib) for "years".  She was paced on eliquis.  She had prior issues with bleeding and thrombocytopenia and has been followed by Dr Marin Olp.  She has not taken anticoagulation in several years.   She presented to Dr Marlou Porch 05/24/18 and was noted to be in SVT (either Aflutter vs atach).  She was mostly unaware.  She has noticed slight worsening of fatigue, but is not very active at baseline.   She has complicating issues of CRF (baseline creatinine 2-2.3), thrombocytopenia (platelet range 28-121k), anemia (baseline hb 10.2), and obesity (250 lbs).  Today, she denies symptoms of palpitations, chest pain, shortness of breath, orthopnea, PND, lower extremity edema, claudication, dizziness, presyncope, syncope, bleeding, or neurologic sequela. The patient is tolerating medications without difficulties and is otherwise without complaint today.    Past Medical History:  Diagnosis Date  . Allergic rhinitis   . Anxiety state, unspecified    panic attacks  . CHF (congestive heart failure) ([place])   . Depressive disorder, not elsewhere classified   . Extrinsic asthma, unspecified    no problem since adulthood  . Obesity   . OSA on CPAP    severe  . Pure hypercholesterolemia   . Respiratory failure with hypoxia ([place]) 09/2008   acute, secondary to multiple bilateral pulmonary embolism , negative hypercoagulable workup 09/2008 hospital stay  . Scoliosis   .  Syncope and collapse   . Type II or unspecified type diabetes mellitus without mention of complication, not stated as uncontrolled   . Unspecified essential hypertension   . Unspecified hypothyroidism    hypo   Past Surgical History:  Procedure Laterality Date  . EYE SURGERY    . JOINT REPLACEMENT    . knee replaced    . THYROID SURGERY       Current Outpatient Medications  Medication Sig Dispense Refill  . acetaminophen (TYLENOL) 325 MG tablet Take 650 mg by mouth every 6 (six) hours as needed.    Marland Kitchen atorvastatin (LIPITOR) 40 MG tablet Take 40 mg by mouth daily.    . Calcium Carbonate-Vit D-Min (CALCIUM 1200 PO) Take 1 tablet by mouth daily.    . cephALEXin (KEFLEX) 500 MG capsule Take 500 mg by mouth 4 (four) times daily.    . Cholecalciferol (VITAMIN D3) 2000 units TABS Take 2,000 Units by mouth daily.    . Coenzyme Q10 200 MG capsule Take 200 mg by mouth daily.      Marland Kitchen diltiazem (DILACOR XR) 120 MG 24 hr capsule Take 1 capsule (120 mg total) by mouth daily. 90 capsule 3  . diphenhydrAMINE (BANOPHEN) 25 MG tablet Take 25 mg by mouth daily.    Marland Kitchen docusate sodium (COLACE) 100 MG capsule Take 1 capsule by mouth daily.    . fenofibrate 54 MG tablet TAKE 1 TABLET(54 MG) BY MOUTH DAILY 90 tablet 1  . fexofenadine (ALLEGRA) 180 MG tablet Take  180 mg by mouth daily.      . furosemide (LASIX) 20 MG tablet Take 2 tablets (40 mg total) by mouth daily as needed. 90 tablet 1  . glipiZIDE (GLUCOTROL) 10 MG tablet Take 1 tablet (10 mg total) by mouth 2 (two) times daily before a meal. 180 tablet 1  . insulin glargine (LANTUS) 100 UNIT/ML injection Inject 0.8 mLs (80 Units total) into the skin daily. 30 mL 3  . Insulin Lispro (HUMALOG [place]) Inject 30-35 Units into the skin.    . Insulin Pen Needle (B-D UF III MINI PEN NEEDLES) 31G X 5 MM MISC Use daily for insulin injection 100 each 2  . Insulin Syringe-Needle U-100 (INSULIN SYRINGE 1CC/30GX5/16") 30G X 5/16" 1 ML MISC USE BID UTD  11  .  levothyroxine (SYNTHROID, LEVOTHROID) 112 MCG tablet TAKE 1 TABLET BY MOUTH DAILY 90 tablet 0  . Multiple Vitamin (MULTIVITAMIN) tablet Take 1 tablet by mouth daily.      . potassium citrate (UROCIT-K) 10 MEQ (1080 MG) SR tablet TK 2 TABLETS PO BID WITH MEALS  4  . Probiotic Product (PROBIOTIC PO) Take 1 capsule by mouth daily.    . ranitidine (ZANTAC) 150 MG tablet Take 150 mg by mouth daily as needed for heartburn.    . sertraline (ZOLOFT) 100 MG tablet Take 1 tablet by mouth daily.  4  . vitamin C (ASCORBIC ACID) 250 MG tablet Take 250 mg by mouth daily.     No current facility-administered medications for this visit.     Allergies:   Apixaban; Aspirin; Mirabegron; Metformin; Pineapple; Tetracycline; Fluticasone-salmeterol; Iodinated diagnostic agents; Lactose intolerance (gi); Latex; Lisinopril; Metronidazole; Sulfa antibiotics; and Sulfonamide derivatives   Social History:  The patient  reports that she quit smoking about 28 years ago. She quit after 20.00 years of use. She has never used smokeless tobacco. She reports that she does not drink alcohol or use drugs.   Family History:  The patient's  family history includes Allergies in her daughter and mother; Arthritis in her other; Asthma in her mother; Clotting disorder in her daughter, maternal grandmother, maternal uncle, and mother; Coronary artery disease in her other; Diabetes in her other; Hyperlipidemia in her other; Hypertension in her other; Osteoarthritis in her daughter and mother; Rheum arthritis in her maternal grandmother; Stroke in her other.    ROS:  Please see the history of present illness.   All other systems are personally reviewed and negative.    PHYSICAL EXAM: VS:  BP 118/62   Pulse (!) 108   Ht 5\' 1"  (1.549 m)   Wt 252 lb 12.8 oz (114.7 kg)   LMP  (LMP Unknown)   SpO2 97%   BMI 47.77 kg/m  , BMI Body mass index is 47.77 kg/m. GEN: overweight in no acute distress  HEENT: normal  Neck: no JVD, carotid  bruits, or masses Cardiac: iRRR; no murmurs, rubs, or gallops, + dependant edema , support hose in place Respiratory:  clear to auscultation bilaterally, normal work of breathing GI: soft, nontender, nondistended, + BS MS: no deformity or atrophy  Skin: warm and dry  Neuro:  Strength and sensation are intact Psych: euthymic mood, full affect  EKG:  EKG is ordered today. The ekg ordered today is personally reviewed and shows atrial flutter (likely typical) with V rate 110 bpm   Recent Labs: 05/19/2018: TSH 0.92 05/20/2018: ALT 14; BUN 35; Creatinine 2.29; Hemoglobin 10.4; Platelet Count 121; Potassium 4.9; Sodium 143  personally reviewed  Lipid Panel     Component Value Date/Time   CHOL 131 05/19/2018 1140   TRIG (H) 05/19/2018 1140    656.0 Triglyceride is over 400; calculations on Lipids are invalid.   HDL 26.70 (L) 05/19/2018 1140   CHOLHDL 5 05/19/2018 1140   VLDL 118.2 (H) 09/25/2011 1511   LDLCALC 65 08/16/2009 0902   LDLDIRECT 41.0 05/19/2018 1140   personally reviewed   Wt Readings from Last 3 Encounters:  06/06/18 252 lb 12.8 oz (114.7 kg)  05/24/18 251 lb 12.8 oz (114.2 kg)  05/20/18 252 lb (114.3 kg)      Other studies personally reviewed: Additional studies/ records that were reviewed today include: Dr Marlou Porch notes, recent ekgs,   Review of the above records today demonstrates: echo 01/25/17 reevals EF 65%, mild MR, LA 16mm   ASSESSMENT AND PLAN:  1.  Paroxysmal atrial fibrillation/ persistent atrial flutter The patient has atrial arrhythmias.  She has complicating issues of CRF (baseline creatinine 2-2.3), thrombocytopenia (platelet range 28-121k), anemia (baseline hb 10.2), and obesity (250 lbs).   She is reluctant to consider anticoagulation at this time.  Her chads2vasc score is at least 5.  She also has prior PTEs.  She is felt to not be a candidate for anticoagulation.  Our only treatment option for her atrial arrhythmias at this time is rate control.   She could follow-up with Dr Marin Olp to reconsider oral anticoagulation, but is reluctant to do so at this time.  She is not a candidate for ablation.  She would also be a poor watchman candidate. I will increase diltiazem CD to 180mg  daily today. No other changes.  2. HTN Stable No change required today  3. OSA Compliance with CPAP encouraged  4. Morbid obesity Body mass index is 47.77 kg/m. Lifestyle modification encouraged  Follow-up:  AF clinic in 4 weeks for further rate control.  Follow-up with Dr Marlou Porch thereafter. I will see as needed.  Current medicines are reviewed at length with the patient today.   The patient does not have concerns regarding her medicines.  The following changes were made today:  none   Signed, Thompson Grayer, MD  06/06/2018 11:44 AM     [facility] [address] [place] [address] [phone] (office) [phone] (fax)

## 2018-06-06 NOTE — Patient Instructions (Addendum)
Medication Instructions:  Your physician has recommended you make the following change in your medication:   1.   Stop taking diltiazem CD 120 mg one capsule daily  2.  Start taking diltiazem CD 180 mg one capsule by mouth daily  Labwork: None ordered.  Testing/Procedures: None ordered.  Follow-Up: Your physician wants you to follow-up in: 4 weeks with Roderic Palau at the [facility] clinic.  Any Other Special Instructions Will Be Listed Below (If Applicable).  If you need a refill on your cardiac medications before your next appointment, please call your pharmacy.

## 2018-06-09 ENCOUNTER — Ambulatory Visit: Payer: Medicare Other | Admitting: Cardiology

## 2018-06-09 DIAGNOSIS — I130 Hypertensive heart and chronic kidney disease with heart failure and stage 1 through stage 4 chronic kidney disease, or unspecified chronic kidney disease: Secondary | ICD-10-CM | POA: Diagnosis not present

## 2018-06-09 DIAGNOSIS — I5033 Acute on chronic diastolic (congestive) heart failure: Secondary | ICD-10-CM | POA: Diagnosis not present

## 2018-06-10 ENCOUNTER — Inpatient Hospital Stay: Payer: Medicare Other | Attending: Hematology & Oncology

## 2018-06-10 ENCOUNTER — Other Ambulatory Visit: Payer: Self-pay

## 2018-06-10 ENCOUNTER — Inpatient Hospital Stay: Payer: Medicare Other

## 2018-06-10 ENCOUNTER — Ambulatory Visit: Payer: Medicare Other

## 2018-06-10 VITALS — BP 111/54 | HR 106 | Temp 97.8°F | Resp 18

## 2018-06-10 DIAGNOSIS — D500 Iron deficiency anemia secondary to blood loss (chronic): Secondary | ICD-10-CM

## 2018-06-10 DIAGNOSIS — D509 Iron deficiency anemia, unspecified: Secondary | ICD-10-CM | POA: Diagnosis not present

## 2018-06-10 DIAGNOSIS — Z86711 Personal history of pulmonary embolism: Secondary | ICD-10-CM | POA: Diagnosis not present

## 2018-06-10 DIAGNOSIS — Z79899 Other long term (current) drug therapy: Secondary | ICD-10-CM | POA: Diagnosis not present

## 2018-06-10 DIAGNOSIS — I4891 Unspecified atrial fibrillation: Secondary | ICD-10-CM | POA: Diagnosis not present

## 2018-06-10 DIAGNOSIS — D693 Immune thrombocytopenic purpura: Secondary | ICD-10-CM | POA: Insufficient documentation

## 2018-06-10 DIAGNOSIS — Z794 Long term (current) use of insulin: Secondary | ICD-10-CM | POA: Diagnosis not present

## 2018-06-10 DIAGNOSIS — D696 Thrombocytopenia, unspecified: Secondary | ICD-10-CM

## 2018-06-10 LAB — CBC WITH DIFFERENTIAL (CANCER CENTER ONLY)
Basophils Absolute: 0 10*3/uL (ref 0.0–0.1)
Basophils Relative: 0 %
Eosinophils Absolute: 0.2 10*3/uL (ref 0.0–0.5)
Eosinophils Relative: 3 %
HCT: 31.9 % — ABNORMAL LOW (ref 34.8–46.6)
Hemoglobin: 10.3 g/dL — ABNORMAL LOW (ref 11.6–15.9)
Lymphocytes Relative: 21 %
Lymphs Abs: 1.2 10*3/uL (ref 0.9–3.3)
MCH: 31.2 pg (ref 26.0–34.0)
MCHC: 32.3 g/dL (ref 32.0–36.0)
MCV: 96.7 fL (ref 81.0–101.0)
Monocytes Absolute: 0.5 10*3/uL (ref 0.1–0.9)
Monocytes Relative: 8 %
Neutro Abs: 4.1 10*3/uL (ref 1.5–6.5)
Neutrophils Relative %: 68 %
Platelet Count: 73 10*3/uL — ABNORMAL LOW (ref 145–400)
RBC: 3.3 MIL/uL — ABNORMAL LOW (ref 3.70–5.32)
RDW: 18.7 % — ABNORMAL HIGH (ref 11.1–15.7)
WBC Count: 6 10*3/uL (ref 3.9–10.0)

## 2018-06-10 LAB — CMP (CANCER CENTER ONLY)
ALT: 15 U/L (ref 0–44)
AST: 21 U/L (ref 15–41)
Albumin: 4 g/dL (ref 3.5–5.0)
Alkaline Phosphatase: 37 U/L — ABNORMAL LOW (ref 38–126)
Anion gap: 12 (ref 5–15)
BUN: 38 mg/dL — ABNORMAL HIGH (ref 8–23)
CO2: 29 mmol/L (ref 22–32)
Calcium: 9.4 mg/dL (ref 8.9–10.3)
Chloride: 103 mmol/L (ref 98–111)
Creatinine: 2.2 mg/dL — ABNORMAL HIGH (ref 0.44–1.00)
GFR, Est AFR Am: 23 mL/min — ABNORMAL LOW (ref >60)
GFR, Estimated: 20 mL/min — ABNORMAL LOW (ref >60)
Glucose, Bld: 196 mg/dL — ABNORMAL HIGH (ref 70–99)
Potassium: 4.8 mmol/L (ref 3.5–5.1)
Sodium: 144 mmol/L (ref 135–145)
Total Bilirubin: 0.6 mg/dL (ref 0.3–1.2)
Total Protein: 7.8 g/dL (ref 6.5–8.1)

## 2018-06-10 MED ORDER — ROMIPLOSTIM INJECTION 500 MCG
570.0000 ug | Freq: Once | SUBCUTANEOUS | Status: AC
  Administered 2018-06-10: 570 ug via SUBCUTANEOUS
  Filled 2018-06-10: qty 1.14

## 2018-06-10 MED ORDER — SODIUM CHLORIDE 0.9 % IV SOLN
510.0000 mg | Freq: Once | INTRAVENOUS | Status: AC
  Administered 2018-06-10: 510 mg via INTRAVENOUS
  Filled 2018-06-10: qty 17

## 2018-06-10 MED ORDER — SODIUM CHLORIDE 0.9 % IV SOLN
Freq: Once | INTRAVENOUS | Status: AC
  Administered 2018-06-10: 14:00:00 via INTRAVENOUS
  Filled 2018-06-10: qty 250

## 2018-06-10 NOTE — Patient Instructions (Signed)
Ferumoxytol injection (Feraheme) What is this medicine? FERUMOXYTOL is an iron complex. Iron is used to make healthy red blood cells, which carry oxygen and nutrients throughout the body. This medicine is used to treat iron deficiency anemia in people with chronic kidney disease. This medicine may be used for other purposes; ask your health care provider or pharmacist if you have questions. COMMON BRAND NAME(S): Feraheme What should I tell my health care provider before I take this medicine? They need to know if you have any of these conditions: -anemia not caused by low iron levels -high levels of iron in the blood -magnetic resonance imaging (MRI) test scheduled -an unusual or allergic reaction to iron, other medicines, foods, dyes, or preservatives -pregnant or trying to get pregnant -breast-feeding How should I use this medicine? This medicine is for injection into a vein. It is given by a health care professional in a hospital or clinic setting. Talk to your pediatrician regarding the use of this medicine in children. Special care may be needed. Overdosage: If you think you have taken too much of this medicine contact a poison control center or emergency room at once. NOTE: This medicine is only for you. Do not share this medicine with others. What if I miss a dose? It is important not to miss your dose. Call your doctor or health care professional if you are unable to keep an appointment. What may interact with this medicine? This medicine may interact with the following medications: -other iron products This list may not describe all possible interactions. Give your health care provider a list of all the medicines, herbs, non-prescription drugs, or dietary supplements you use. Also tell them if you smoke, drink alcohol, or use illegal drugs. Some items may interact with your medicine. What should I watch for while using this medicine? Visit your doctor or healthcare professional  regularly. Tell your doctor or healthcare professional if your symptoms do not start to get better or if they get worse. You may need blood work done while you are taking this medicine. You may need to follow a special diet. Talk to your doctor. Foods that contain iron include: whole grains/cereals, dried fruits, beans, or peas, leafy green vegetables, and organ meats (liver, kidney). What side effects may I notice from receiving this medicine? Side effects that you should report to your doctor or health care professional as soon as possible: -allergic reactions like skin rash, itching or hives, swelling of the face, lips, or tongue -breathing problems -changes in blood pressure -feeling faint or lightheaded, falls -fever or chills -flushing, sweating, or hot feelings -swelling of the ankles or feet Side effects that usually do not require medical attention (report to your doctor or health care professional if they continue or are bothersome): -diarrhea -headache -nausea, vomiting -stomach pain This list may not describe all possible side effects. Call your doctor for medical advice about side effects. You may report side effects to FDA at 1-800-FDA-1088. Where should I keep my medicine? This drug is given in a hospital or clinic and will not be stored at home. NOTE: This sheet is a summary. It may not cover all possible information. If you have questions about this medicine, talk to your doctor, pharmacist, or health care provider.  2018 Elsevier/Gold Standard (2015-09-26 12:41:49)   Romiplostim injection (Nplate) What is this medicine? ROMIPLOSTIM ([place] mi PLOE stim) helps your body make more platelets. This medicine is used to treat low platelets caused by chronic idiopathic thrombocytopenic purpura (ITP).  This medicine may be used for other purposes; ask your health care provider or pharmacist if you have questions. COMMON BRAND NAME(S): Nplate What should I tell my health care  provider before I take this medicine? They need to know if you have any of these conditions: -cancer or myelodysplastic syndrome -low blood counts, like low white cell, platelet, or red cell counts -take medicines that treat or prevent blood clots -an unusual or allergic reaction to romiplostim, mannitol, other medicines, foods, dyes, or preservatives -pregnant or trying to get pregnant -breast-feeding How should I use this medicine? This medicine is for injection under the skin. It is given by a health care professional in a hospital or clinic setting. A special MedGuide will be given to you before your injection. Read this information carefully each time. Talk to your pediatrician regarding the use of this medicine in children. Special care may be needed. Overdosage: If you think you have taken too much of this medicine contact a poison control center or emergency room at once. NOTE: This medicine is only for you. Do not share this medicine with others. What if I miss a dose? It is important not to miss your dose. Call your doctor or health care professional if you are unable to keep an appointment. What may interact with this medicine? Interactions are not expected. This list may not describe all possible interactions. Give your health care provider a list of all the medicines, herbs, non-prescription drugs, or dietary supplements you use. Also tell them if you smoke, drink alcohol, or use illegal drugs. Some items may interact with your medicine. What should I watch for while using this medicine? Your condition will be monitored carefully while you are receiving this medicine. Visit your prescriber or health care professional for regular checks on your progress and for the needed blood tests. It is important to keep all appointments. What side effects may I notice from receiving this medicine? Side effects that you should report to your doctor or health care professional as soon as  possible: -allergic reactions like skin rash, itching or hives, swelling of the face, lips, or tongue -shortness of breath, chest pain, swelling in a leg -unusual bleeding or bruising Side effects that usually do not require medical attention (report to your doctor or health care professional if they continue or are bothersome): -dizziness -headache -muscle aches -pain in arms and legs -stomach pain -trouble sleeping This list may not describe all possible side effects. Call your doctor for medical advice about side effects. You may report side effects to FDA at 1-800-FDA-1088. Where should I keep my medicine? This drug is given in a hospital or clinic and will not be stored at home. NOTE: This sheet is a summary. It may not cover all possible information. If you have questions about this medicine, talk to your doctor, pharmacist, or health care provider.  2018 Elsevier/Gold Standard (2008-04-23 15:13:04)

## 2018-06-20 DIAGNOSIS — N200 Calculus of kidney: Secondary | ICD-10-CM | POA: Diagnosis not present

## 2018-06-23 DIAGNOSIS — I130 Hypertensive heart and chronic kidney disease with heart failure and stage 1 through stage 4 chronic kidney disease, or unspecified chronic kidney disease: Secondary | ICD-10-CM | POA: Diagnosis not present

## 2018-06-23 DIAGNOSIS — I5033 Acute on chronic diastolic (congestive) heart failure: Secondary | ICD-10-CM | POA: Diagnosis not present

## 2018-07-01 ENCOUNTER — Inpatient Hospital Stay: Payer: Medicare Other

## 2018-07-01 ENCOUNTER — Inpatient Hospital Stay (HOSPITAL_BASED_OUTPATIENT_CLINIC_OR_DEPARTMENT_OTHER): Payer: Medicare Other | Admitting: Family

## 2018-07-01 ENCOUNTER — Other Ambulatory Visit: Payer: Self-pay

## 2018-07-01 VITALS — BP 145/57 | HR 83 | Temp 97.9°F | Resp 19 | Wt 253.8 lb

## 2018-07-01 DIAGNOSIS — D509 Iron deficiency anemia, unspecified: Secondary | ICD-10-CM

## 2018-07-01 DIAGNOSIS — I4891 Unspecified atrial fibrillation: Secondary | ICD-10-CM

## 2018-07-01 DIAGNOSIS — D693 Immune thrombocytopenic purpura: Secondary | ICD-10-CM | POA: Diagnosis not present

## 2018-07-01 DIAGNOSIS — Z79899 Other long term (current) drug therapy: Secondary | ICD-10-CM

## 2018-07-01 DIAGNOSIS — Z794 Long term (current) use of insulin: Secondary | ICD-10-CM | POA: Diagnosis not present

## 2018-07-01 DIAGNOSIS — D696 Thrombocytopenia, unspecified: Secondary | ICD-10-CM

## 2018-07-01 DIAGNOSIS — Z86711 Personal history of pulmonary embolism: Secondary | ICD-10-CM | POA: Diagnosis not present

## 2018-07-01 DIAGNOSIS — D500 Iron deficiency anemia secondary to blood loss (chronic): Secondary | ICD-10-CM

## 2018-07-01 LAB — CBC WITH DIFFERENTIAL (CANCER CENTER ONLY)
Abs Immature Granulocytes: 0.04 10*3/uL (ref 0.00–0.07)
Basophils Absolute: 0 10*3/uL (ref 0.0–0.1)
Basophils Relative: 0 %
Eosinophils Absolute: 0.2 10*3/uL (ref 0.0–0.5)
Eosinophils Relative: 3 %
HCT: 34.5 % — ABNORMAL LOW (ref 36.0–46.0)
Hemoglobin: 11 g/dL — ABNORMAL LOW (ref 12.0–15.0)
Immature Granulocytes: 1 %
Lymphocytes Relative: 18 %
Lymphs Abs: 1.2 10*3/uL (ref 0.7–4.0)
MCH: 30.3 pg (ref 26.0–34.0)
MCHC: 31.9 g/dL (ref 30.0–36.0)
MCV: 95 fL (ref 80.0–100.0)
Monocytes Absolute: 0.5 10*3/uL (ref 0.1–1.0)
Monocytes Relative: 8 %
Neutro Abs: 4.6 10*3/uL (ref 1.7–7.7)
Neutrophils Relative %: 70 %
Platelet Count: 101 10*3/uL — ABNORMAL LOW (ref 150–400)
RBC: 3.63 MIL/uL — ABNORMAL LOW (ref 3.87–5.11)
RDW: 18.6 % — ABNORMAL HIGH (ref 11.5–15.5)
WBC Count: 6.5 10*3/uL (ref 4.0–10.5)
nRBC: 0 % (ref 0.0–0.2)

## 2018-07-01 LAB — CMP (CANCER CENTER ONLY)
ALT: 20 U/L (ref 0–44)
AST: 22 U/L (ref 15–41)
Albumin: 4.2 g/dL (ref 3.5–5.0)
Alkaline Phosphatase: 38 U/L (ref 38–126)
Anion gap: 14 (ref 5–15)
BUN: 37 mg/dL — ABNORMAL HIGH (ref 8–23)
CO2: 29 mmol/L (ref 22–32)
Calcium: 9.9 mg/dL (ref 8.9–10.3)
Chloride: 101 mmol/L (ref 98–111)
Creatinine: 2.16 mg/dL — ABNORMAL HIGH (ref 0.44–1.00)
GFR, Est AFR Am: 24 mL/min — ABNORMAL LOW (ref >60)
GFR, Estimated: 20 mL/min — ABNORMAL LOW (ref >60)
Glucose, Bld: 180 mg/dL — ABNORMAL HIGH (ref 70–99)
Potassium: 4.5 mmol/L (ref 3.5–5.1)
Sodium: 144 mmol/L (ref 135–145)
Total Bilirubin: 0.6 mg/dL (ref 0.3–1.2)
Total Protein: 8 g/dL (ref 6.5–8.1)

## 2018-07-01 MED ORDER — ROMIPLOSTIM INJECTION 500 MCG
570.0000 ug | Freq: Once | SUBCUTANEOUS | Status: AC
  Administered 2018-07-01: 570 ug via SUBCUTANEOUS
  Filled 2018-07-01: qty 1.14

## 2018-07-01 NOTE — Patient Instructions (Signed)
Romiplostim injection What is this medicine? ROMIPLOSTIM (roe mi PLOE stim) helps your body make more platelets. This medicine is used to treat low platelets caused by chronic idiopathic thrombocytopenic purpura (ITP). This medicine may be used for other purposes; ask your health care provider or pharmacist if you have questions. COMMON BRAND NAME(S): Nplate What should I tell my health care provider before I take this medicine? They need to know if you have any of these conditions: -cancer or myelodysplastic syndrome -low blood counts, like low white cell, platelet, or red cell counts -take medicines that treat or prevent blood clots -an unusual or allergic reaction to romiplostim, mannitol, other medicines, foods, dyes, or preservatives -pregnant or trying to get pregnant -breast-feeding How should I use this medicine? This medicine is for injection under the skin. It is given by a health care professional in a hospital or clinic setting. A special MedGuide will be given to you before your injection. Read this information carefully each time. Talk to your pediatrician regarding the use of this medicine in children. Special care may be needed. Overdosage: If you think you have taken too much of this medicine contact a poison control center or emergency room at once. NOTE: This medicine is only for you. Do not share this medicine with others. What if I miss a dose? It is important not to miss your dose. Call your doctor or health care professional if you are unable to keep an appointment. What may interact with this medicine? Interactions are not expected. This list may not describe all possible interactions. Give your health care provider a list of all the medicines, herbs, non-prescription drugs, or dietary supplements you use. Also tell them if you smoke, drink alcohol, or use illegal drugs. Some items may interact with your medicine. What should I watch for while using this  medicine? Your condition will be monitored carefully while you are receiving this medicine. Visit your prescriber or health care professional for regular checks on your progress and for the needed blood tests. It is important to keep all appointments. What side effects may I notice from receiving this medicine? Side effects that you should report to your doctor or health care professional as soon as possible: -allergic reactions like skin rash, itching or hives, swelling of the face, lips, or tongue -shortness of breath, chest pain, swelling in a leg -unusual bleeding or bruising Side effects that usually do not require medical attention (report to your doctor or health care professional if they continue or are bothersome): -dizziness -headache -muscle aches -pain in arms and legs -stomach pain -trouble sleeping This list may not describe all possible side effects. Call your doctor for medical advice about side effects. You may report side effects to FDA at 1-800-FDA-1088. Where should I keep my medicine? This drug is given in a hospital or clinic and will not be stored at home. NOTE: This sheet is a summary. It may not cover all possible information. If you have questions about this medicine, talk to your doctor, pharmacist, or health care provider.  2018 Elsevier/Gold Standard (2008-04-23 15:13:04)  

## 2018-07-01 NOTE — Progress Notes (Signed)
Hematology and Oncology Follow Up Visit  [name] [id number] [birth year] 83 y.o. 07/01/2018   Principle Diagnosis:  Immune based thrombocytopenia History of PE Iron deficiency anemia Atrial fib  Current Therapy:   Nplate as indicated IV Iron as indicated - dose given on 07/07/2017   Interim History: [name] is here today with her daughter for follow-up. Her platelet count is stable at 103.  She states that she recently went back into afib and is asymptomatic with this so far. She is on cardizem and states that she is not a candidate for ablation. She continues to be followed closely by cardiology.  She is still off of anticoagulation.  No episodes of bleeding to report. No bruising or petechiae.  She has cellulitis of the right lower extremity and states that her APP in the assisted living facility has treated with an antibiotic and is monitoring.  The swelling in her lower extremities appears to be improved. She is taking lasix as prescribed.  No fever, chills, n/v, cough, rash, dizziness, SOB, chest pain, palpitations, abdominal pain or changes in bowel or bladder habits.  No numbness or tingling in her extremities at this time. She will sometimes have numbness and tingling in 2 toes of her right foot. This improves when her home aid rubs her feet and legs.  No lymphadenopathy noted on exam.  Her blood sugars are fairly controlled. She has a good appetite and is staying well hydrated. Her weight is stable.   ECOG Performance Status: 1 - Symptomatic but completely ambulatory  Medications:  Allergies as of 07/01/2018      Reactions   Apixaban Other (See Comments)   Internal Bleeding Patient states internal bleeding.   Aspirin Itching, Rash, Hives, Swelling   Swelling of her tongue   Mirabegron Other (See Comments)   Patient experienced A-Fib   Metformin Diarrhea, Nausea Only   Pineapple Swelling   Throat swells and blisters on tongue and roof of mouth per patient    Tetracycline Hives   Fluticasone-salmeterol Itching, Rash   Iodinated Diagnostic Agents Itching, Rash      Lactose Intolerance (gi) Other (See Comments)   gas   Latex Itching, Rash, Other (See Comments)   Pt. States latex pulls her skin off.    Lisinopril Cough   Metronidazole Other (See Comments)   Unknown   Sulfa Antibiotics Rash   Sulfonamide Derivatives Itching, Rash      Medication List        Accurate as of 07/01/18  1:58 PM. Always use your most recent med list.          acetaminophen 325 MG tablet Commonly known as:  TYLENOL Take 650 mg by mouth every 6 (six) hours as needed.   atorvastatin 40 MG tablet Commonly known as:  LIPITOR Take 40 mg by mouth daily.   BANOPHEN 25 MG tablet Generic drug:  diphenhydrAMINE Take 25 mg by mouth daily.   CALCIUM 1200 PO Take 1 tablet by mouth daily.   cephALEXin 500 MG capsule Commonly known as:  KEFLEX Take 500 mg by mouth 4 (four) times daily.   Coenzyme Q10 200 MG capsule Take 200 mg by mouth daily.   diltiazem 180 MG 24 hr capsule Commonly known as:  CARDIZEM CD Take 1 capsule (180 mg total) by mouth daily.   docusate sodium 100 MG capsule Commonly known as:  COLACE Take 1 capsule by mouth daily.   fenofibrate 54 MG tablet TAKE 1 TABLET(54 MG) BY MOUTH  DAILY   fexofenadine 180 MG tablet Commonly known as:  ALLEGRA Take 180 mg by mouth daily.   furosemide 20 MG tablet Commonly known as:  LASIX Take 2 tablets (40 mg total) by mouth daily as needed.   glipiZIDE 10 MG tablet Commonly known as:  GLUCOTROL Take 1 tablet (10 mg total) by mouth 2 (two) times daily before a meal.   HUMALOG [place] Inject 30-35 Units into the skin.   insulin glargine 100 UNIT/ML injection Commonly known as:  LANTUS Inject 0.8 mLs (80 Units total) into the skin daily.   Insulin Pen Needle 31G X 5 MM Misc Use daily for insulin injection   INSULIN SYRINGE 1CC/30GX5/16" 30G X 5/16" 1 ML Misc USE BID UTD   levothyroxine  112 MCG tablet Commonly known as:  SYNTHROID, LEVOTHROID TAKE 1 TABLET BY MOUTH DAILY   multivitamin tablet Take 1 tablet by mouth daily.   potassium citrate 10 MEQ (1080 MG) SR tablet Commonly known as:  UROCIT-K TK 2 TABLETS PO BID WITH MEALS   PROBIOTIC PO Take 1 capsule by mouth daily.   ranitidine 150 MG tablet Commonly known as:  ZANTAC Take 150 mg by mouth daily as needed for heartburn.   sertraline 100 MG tablet Commonly known as:  ZOLOFT Take 1 tablet by mouth daily.   vitamin C 250 MG tablet Commonly known as:  ASCORBIC ACID Take 250 mg by mouth daily.   Vitamin D3 2000 units Tabs Take 2,000 Units by mouth daily.       Allergies:  Allergies  Allergen Reactions  . Apixaban Other (See Comments)    Internal Bleeding  Patient states internal bleeding.  . Aspirin Itching, Rash, Hives and Swelling    Swelling of her tongue  . Mirabegron Other (See Comments)    Patient experienced A-Fib  . Metformin Diarrhea and Nausea Only  . Pineapple Swelling    Throat swells and blisters on tongue and roof of mouth per patient  . Tetracycline Hives  . Fluticasone-Salmeterol Itching and Rash  . Iodinated Diagnostic Agents Itching and Rash       . Lactose Intolerance (Gi) Other (See Comments)    gas  . Latex Itching, Rash and Other (See Comments)    Pt. States latex pulls her skin off.   . Lisinopril Cough  . Metronidazole Other (See Comments)    Unknown   . Sulfa Antibiotics Rash  . Sulfonamide Derivatives Itching and Rash    Past Medical History, Surgical history, Social history, and Family History were reviewed and updated.  Review of Systems: All other 10 point review of systems is negative.   Physical Exam:  weight is 253 lb 12.8 oz (115.1 kg). Her oral temperature is 97.9 F (36.6 C). Her blood pressure is 145/57 (abnormal) and her pulse is 83. Her respiration is 19 and oxygen saturation is 96%.   Wt Readings from Last 3 Encounters:  07/01/18 253 lb  12.8 oz (115.1 kg)  06/06/18 252 lb 12.8 oz (114.7 kg)  05/24/18 251 lb 12.8 oz (114.2 kg)    Ocular: Sclerae unicteric, pupils equal, round and reactive to light Ear-nose-throat: Oropharynx clear, dentition fair Lymphatic: No cervical, supraclavicular or axillary adenopathy Lungs no rales or rhonchi, good excursion bilaterally Heart atrial fib, controlled, no murmur appreciated Abd soft, nontender, positive bowel sounds, no liver or spleen tip palpated on exam, no fluid wave  MSK no focal spinal tenderness, no joint edema Neuro: non-focal, well-oriented, appropriate affect Breasts: Deferred  Lab Results  Component Value Date   WBC 6.5 07/01/2018   HGB 11.0 (L) 07/01/2018   HCT 34.5 (L) 07/01/2018   MCV 95.0 07/01/2018   PLT 101 (L) 07/01/2018   Lab Results  Component Value Date   FERRITIN 42 05/20/2018   IRON 79 05/20/2018   TIBC 438 05/20/2018   UIBC 359 05/20/2018   IRONPCTSAT 18 (L) 05/20/2018   Lab Results  Component Value Date   RETICCTPCT 1.9 01/28/2011   RBC 3.63 (L) 07/01/2018   RETICCTABS 73.3 01/28/2011   No results found for: KPAFRELGTCHN, LAMBDASER, KAPLAMBRATIO No results found for: Kandis Cocking, IGMSERUM No results found for: Odetta Pink, SPEI   Chemistry      Component Value Date/Time   NA 144 06/10/2018 1318   NA 145 09/08/2017 1317   NA 142 02/25/2017 1013   K 4.8 06/10/2018 1318   K 4.5 09/08/2017 1317   K 3.8 02/25/2017 1013   CL 103 06/10/2018 1318   CL 98 09/08/2017 1317   CO2 29 06/10/2018 1318   CO2 32 09/08/2017 1317   CO2 26 02/25/2017 1013   BUN 38 (H) 06/10/2018 1318   BUN 34 (H) 09/08/2017 1317   BUN 20.1 02/25/2017 1013   CREATININE 2.20 (H) 06/10/2018 1318   CREATININE 1.9 (H) 09/08/2017 1317   CREATININE 1.8 (H) 02/25/2017 1013      Component Value Date/Time   CALCIUM 9.4 06/10/2018 1318   CALCIUM 9.3 09/08/2017 1317   CALCIUM 8.7 02/25/2017 1013   ALKPHOS 37 (L)  06/10/2018 1318   ALKPHOS 45 09/08/2017 1317   ALKPHOS 65 02/25/2017 1013   AST 21 06/10/2018 1318   AST 16 02/25/2017 1013   ALT 15 06/10/2018 1318   ALT 26 09/08/2017 1317   ALT 13 02/25/2017 1013   BILITOT 0.6 06/10/2018 1318   BILITOT 0.76 02/25/2017 1013       Impression and Plan: [name] is a very pleasant 83 yo caucasian female with immune based thrombocytopenia. He platelet count is stable at 103.  She received Nplate today. We will plan to see her back in another 6 weeks for follow-up and in 3 weeks for lab check and injection if needed.  She will contact our office with any questions or concerns. We can certainly see her sooner if need be.   Laverna Peace, NP 10/25/20191:58 PM

## 2018-07-04 ENCOUNTER — Ambulatory Visit (HOSPITAL_COMMUNITY)
Admission: RE | Admit: 2018-07-04 | Discharge: 2018-07-04 | Disposition: A | Discharge status: Home / Self Care | Payer: Medicare Other | Source: Ambulatory Visit | Attending: Nurse Practitioner | Admitting: Nurse Practitioner

## 2018-07-04 ENCOUNTER — Other Ambulatory Visit: Payer: Self-pay

## 2018-07-04 ENCOUNTER — Encounter (HOSPITAL_COMMUNITY): Payer: Self-pay | Admitting: Nurse Practitioner

## 2018-07-04 VITALS — BP 142/64 | HR 83 | Ht 61.0 in | Wt 255.8 lb

## 2018-07-04 DIAGNOSIS — E7800 Pure hypercholesterolemia, unspecified: Secondary | ICD-10-CM | POA: Diagnosis not present

## 2018-07-04 DIAGNOSIS — Z881 Allergy status to other antibiotic agents status: Secondary | ICD-10-CM | POA: Insufficient documentation

## 2018-07-04 DIAGNOSIS — Z91018 Allergy to other foods: Secondary | ICD-10-CM | POA: Diagnosis not present

## 2018-07-04 DIAGNOSIS — I509 Heart failure, unspecified: Secondary | ICD-10-CM | POA: Diagnosis not present

## 2018-07-04 DIAGNOSIS — Z79899 Other long term (current) drug therapy: Secondary | ICD-10-CM | POA: Insufficient documentation

## 2018-07-04 DIAGNOSIS — Z87891 Personal history of nicotine dependence: Secondary | ICD-10-CM | POA: Diagnosis not present

## 2018-07-04 DIAGNOSIS — Z9889 Other specified postprocedural states: Secondary | ICD-10-CM | POA: Diagnosis not present

## 2018-07-04 DIAGNOSIS — I4892 Unspecified atrial flutter: Secondary | ICD-10-CM | POA: Insufficient documentation

## 2018-07-04 DIAGNOSIS — E039 Hypothyroidism, unspecified: Secondary | ICD-10-CM | POA: Insufficient documentation

## 2018-07-04 DIAGNOSIS — F329 Major depressive disorder, single episode, unspecified: Secondary | ICD-10-CM | POA: Insufficient documentation

## 2018-07-04 DIAGNOSIS — Z886 Allergy status to analgesic agent status: Secondary | ICD-10-CM | POA: Insufficient documentation

## 2018-07-04 DIAGNOSIS — Z8349 Family history of other endocrine, nutritional and metabolic diseases: Secondary | ICD-10-CM | POA: Insufficient documentation

## 2018-07-04 DIAGNOSIS — E119 Type 2 diabetes mellitus without complications: Secondary | ICD-10-CM | POA: Insufficient documentation

## 2018-07-04 DIAGNOSIS — I480 Paroxysmal atrial fibrillation: Secondary | ICD-10-CM | POA: Insufficient documentation

## 2018-07-04 DIAGNOSIS — I110 Hypertensive heart disease with heart failure: Secondary | ICD-10-CM | POA: Insufficient documentation

## 2018-07-04 DIAGNOSIS — Z91040 Latex allergy status: Secondary | ICD-10-CM | POA: Insufficient documentation

## 2018-07-04 DIAGNOSIS — Z79890 Hormone replacement therapy (postmenopausal): Secondary | ICD-10-CM | POA: Insufficient documentation

## 2018-07-04 DIAGNOSIS — G4733 Obstructive sleep apnea (adult) (pediatric): Secondary | ICD-10-CM | POA: Diagnosis not present

## 2018-07-04 DIAGNOSIS — Z6841 Body Mass Index (BMI) 40.0 and over, adult: Secondary | ICD-10-CM | POA: Diagnosis not present

## 2018-07-04 DIAGNOSIS — E739 Lactose intolerance, unspecified: Secondary | ICD-10-CM | POA: Diagnosis not present

## 2018-07-04 DIAGNOSIS — Z882 Allergy status to sulfonamides status: Secondary | ICD-10-CM | POA: Insufficient documentation

## 2018-07-04 DIAGNOSIS — Z8249 Family history of ischemic heart disease and other diseases of the circulatory system: Secondary | ICD-10-CM | POA: Insufficient documentation

## 2018-07-04 DIAGNOSIS — Z794 Long term (current) use of insulin: Secondary | ICD-10-CM | POA: Diagnosis not present

## 2018-07-04 DIAGNOSIS — E669 Obesity, unspecified: Secondary | ICD-10-CM | POA: Diagnosis not present

## 2018-07-04 DIAGNOSIS — Z888 Allergy status to other drugs, medicaments and biological substances status: Secondary | ICD-10-CM | POA: Diagnosis not present

## 2018-07-04 LAB — IRON AND TIBC
Iron: 97 ug/dL (ref 41–142)
Saturation Ratios: 25 % (ref 21–57)
TIBC: 388 ug/dL (ref 236–444)
UIBC: 291 ug/dL

## 2018-07-04 LAB — FERRITIN: Ferritin: 204 ng/mL (ref 11–307)

## 2018-07-04 NOTE — Progress Notes (Signed)
Primary Care Physician: Javier Glazier, MD Referring Physician: Dr. Rayann Heman Cardiologist: Dr. Christie Beckers [name] is a 83 y.o. female with a h/o prior afib as well as prior PTE. She was in [place] PTE study with Dr Joya Gaskins years ago. She reports having palpitations (likely afib) for "years". She was placed on eliquis. She had prior issues with bleeding and thrombocytopenia and has been followed by Dr Marin Olp. She has not taken anticoagulation in several years.  She presented to Dr Marlou Porch 05/24/18 and was noted to be in SVT (either Aflutter vs atach). She was mostly unaware. She has noticed slight worsening of fatigue, but is not very active at baseline.  She has complicating issues of CRF (baseline creatinine 2-2.3), thrombocytopenia (platelet range 28-121k), anemia (baseline hb 10.2), and obesity (250 lbs).   He felt that rate control was her goal and increased her diltiazem to 180 mg qd. She is now being followed in the afib clinic and is nicely rate controlled today in the 80's. She feels improved.  Today, she denies symptoms of palpitations, chest pain, shortness of breath, orthopnea, PND, lower extremity edema, dizziness, presyncope, syncope, or neurologic sequela. The patient is tolerating medications without difficulties and is otherwise without complaint today.   Past Medical History:  Diagnosis Date  . Allergic rhinitis   . Anxiety state, unspecified    panic attacks  . CHF (congestive heart failure) ([place])   . Depressive disorder, not elsewhere classified   . Extrinsic asthma, unspecified    no problem since adulthood  . Obesity   . OSA on CPAP    severe  . Pure hypercholesterolemia   . Respiratory failure with hypoxia ([place]) 09/2008   acute, secondary to multiple bilateral pulmonary embolism , negative hypercoagulable workup 09/2008 hospital stay  . Scoliosis   . Syncope and collapse   . Type II or unspecified type diabetes mellitus without mention of complication, not  stated as uncontrolled   . Unspecified essential hypertension   . Unspecified hypothyroidism    hypo   Past Surgical History:  Procedure Laterality Date  . EYE SURGERY    . JOINT REPLACEMENT    . knee replaced    . THYROID SURGERY      Current Outpatient Medications  Medication Sig Dispense Refill  . acetaminophen (TYLENOL) 325 MG tablet Take 650 mg by mouth every 6 (six) hours as needed.    Marland Kitchen atorvastatin (LIPITOR) 40 MG tablet Take 40 mg by mouth daily.    . bisacodyl (DULCOLAX) 5 MG EC tablet Take 5 mg by mouth daily as needed for moderate constipation. As needed every three days    . Calcium Carbonate-Vit D-Min (CALCIUM 1200 PO) Take 1 tablet by mouth daily.    . Cholecalciferol (VITAMIN D3) 2000 units TABS Take 2,000 Units by mouth daily.    Marland Kitchen diltiazem (CARDIZEM CD) 180 MG 24 hr capsule Take 1 capsule (180 mg total) by mouth daily. 90 capsule 3  . diphenhydrAMINE (BANOPHEN) 25 MG tablet Take 25 mg by mouth daily.    Marland Kitchen docusate sodium (COLACE) 100 MG capsule Take 1 capsule by mouth daily.    . fenofibrate 54 MG tablet TAKE 1 TABLET(54 MG) BY MOUTH DAILY 90 tablet 1  . fexofenadine (ALLEGRA) 180 MG tablet Take 180 mg by mouth daily.      . Flaxseed, Linseed, (FLAXSEED OIL) 1000 MG CAPS Take by mouth 2 (two) times daily.    . furosemide (LASIX) 20 MG tablet Take  2 tablets (40 mg total) by mouth daily as needed. (Patient taking differently: Take 40 mg by mouth daily as needed. Three times weekly) 90 tablet 1  . glipiZIDE (GLUCOTROL) 10 MG tablet Take 1 tablet (10 mg total) by mouth 2 (two) times daily before a meal. 180 tablet 1  . insulin aspart (NOVOLOG) 100 UNIT/ML injection Inject into the skin.    Marland Kitchen insulin glargine (LANTUS) 100 UNIT/ML injection Inject 0.8 mLs (80 Units total) into the skin daily. 30 mL 3  . Insulin Lispro (HUMALOG [place]) Inject 30-35 Units into the skin.    . Insulin Pen Needle (B-D UF III MINI PEN NEEDLES) 31G X 5 MM MISC Use daily for insulin injection 100 each  2  . Insulin Syringe-Needle U-100 (INSULIN SYRINGE 1CC/30GX5/16") 30G X 5/16" 1 ML MISC USE BID UTD  11  . levothyroxine (SYNTHROID, LEVOTHROID) 112 MCG tablet TAKE 1 TABLET BY MOUTH DAILY 90 tablet 0  . Multiple Vitamin (MULTIVITAMIN) tablet Take 1 tablet by mouth daily.      . potassium citrate (UROCIT-K) 10 MEQ (1080 MG) SR tablet TK 2 TABLETS PO BID WITH MEALS  4  . Probiotic Product (PROBIOTIC PO) Take 1 capsule by mouth daily.    . ranitidine (ZANTAC) 150 MG tablet Take 150 mg by mouth daily as needed for heartburn.    . sertraline (ZOLOFT) 100 MG tablet Take 1 tablet by mouth daily.  4  . vitamin C (ASCORBIC ACID) 250 MG tablet Take 250 mg by mouth daily.     No current facility-administered medications for this encounter.     Allergies  Allergen Reactions  . Apixaban Other (See Comments)    Internal Bleeding  Patient states internal bleeding.  . Aspirin Itching, Rash, Hives and Swelling    Swelling of her tongue  . Mirabegron Other (See Comments)    Patient experienced A-Fib  . Metformin Diarrhea and Nausea Only  . Pineapple Swelling    Throat swells and blisters on tongue and roof of mouth per patient  . Tetracycline Hives  . Fluticasone-Salmeterol Itching and Rash  . Iodinated Diagnostic Agents Itching and Rash       . Lactose Intolerance (Gi) Other (See Comments)    gas  . Latex Itching, Rash and Other (See Comments)    Pt. States latex pulls her skin off.   . Lisinopril Cough  . Metronidazole Other (See Comments)    Unknown   . Sulfa Antibiotics Rash  . Sulfonamide Derivatives Itching and Rash    Social History   Socioeconomic History  . Marital status: Widowed    Spouse name: Not on file  . Number of children: 2  . Years of education: Not on file  . Highest education level: Not on file  Occupational History  . 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 Financial resource strain: Not  on file  . Food insecurity:    Worry: Not on file    Inability: Not on file  . Transportation needs:    Medical: Not on file    Non-medical: Not on file  Tobacco Use  . Smoking status: Former Smoker    Years: 20.00    Last attempt to quit: 09/07/1989    Years since quitting: 28.8  . Smokeless tobacco: Never Used  Substance and Sexual Activity  . Alcohol use: No  . Drug use: No  . Sexual activity: Not  on file  Lifestyle  . Physical activity:    Days per week: Not on file    Minutes per session: Not on file  . Stress: Not on file  Relationships  . Social connections:    Talks on phone: Not on file    Gets together: Not on file    Attends religious service: Not on file    Active member of club or organization: Not on file    Attends meetings of clubs or organizations: Not on file    Relationship status: Not on file  . Intimate partner violence:    Fear of current or ex partner: Not on file    Emotionally abused: Not on file    Physically abused: Not on file    Forced sexual activity: Not on file  Other Topics Concern  . Not on file  Social History Narrative  . Not on file    Family History  Problem Relation Age of Onset  . Allergies Mother   . Clotting disorder Mother   . Osteoarthritis Mother   . Asthma Mother   . Arthritis Other   . Diabetes Other   . Hyperlipidemia Other   . Hypertension Other   . Coronary artery disease Other   . Stroke Other   . Osteoarthritis Daughter   . Rheum arthritis Maternal Grandmother   . Clotting disorder Maternal Grandmother   . Clotting disorder Maternal Uncle   . Clotting disorder Daughter   . Allergies Daughter   . Breast cancer Neg Hx     ROS- All systems are reviewed and negative except as per the HPI above  Physical Exam: Vitals:   07/04/18 1139  BP: (!) 142/64  Pulse: 83  SpO2: 95%  Weight: 116 kg  Height: 5\' 1"  (1.549 m)   Wt Readings from Last 3 Encounters:  07/04/18 116 kg  07/01/18 115.1 kg  06/06/18  114.7 kg    Labs: Lab Results  Component Value Date   NA 144 07/01/2018   K 4.5 07/01/2018   CL 101 07/01/2018   CO2 29 07/01/2018   GLUCOSE 180 (H) 07/01/2018   BUN 37 (H) 07/01/2018   CREATININE 2.16 (H) 07/01/2018   CALCIUM 9.9 07/01/2018   PHOS 4.5 08/25/2015   MG 2.2 08/25/2015   Lab Results  Component Value Date   INR 1.03 06/28/2017   Lab Results  Component Value Date   CHOL 131 05/19/2018   HDL 26.70 (L) 05/19/2018   LDLCALC 65 08/16/2009   TRIG (H) 05/19/2018    656.0 Triglyceride is over 400; calculations on Lipids are invalid.     GEN- The patient is well appearing, alert and oriented x 3 today.   Head- normocephalic, atraumatic Eyes-  Sclera clear, conjunctiva pink Ears- hearing intact Oropharynx- clear Neck- supple, no JVP Lymph- no cervical lymphadenopathy Lungs- Clear to ausculation bilaterally, normal work of breathing Heart- irregular rate and rhythm, no murmurs, rubs or gallops, PMI not laterally displaced GI- soft, NT, ND, + BS Extremities- no clubbing, cyanosis, or edema MS- no significant deformity or atrophy Skin- no rash or lesion Psych- euthymic mood, full affect Neuro- strength and sensation are intact  EKG-atrial flutter at 83 bpm with variable block    Assessment and Plan: 1.Paroxysmal afib/persisistent atrial flutter Her CHA2DS2VASc score is at least  5 She is not felt to be a good candidate for anticoagualtion Dr.Allred felt her best treatment option at this time is  rate control She is not a candidate  for ablation  2. Htn Stable  3.OSA Use of cpap encouraged  F/u with Dr. Marlou Porch in 3 months  Geroge Baseman. Muslima Toppins, [facility] [address] [place], [place] [address] [phone]

## 2018-07-07 DIAGNOSIS — I5033 Acute on chronic diastolic (congestive) heart failure: Secondary | ICD-10-CM | POA: Diagnosis not present

## 2018-07-07 DIAGNOSIS — I130 Hypertensive heart and chronic kidney disease with heart failure and stage 1 through stage 4 chronic kidney disease, or unspecified chronic kidney disease: Secondary | ICD-10-CM | POA: Diagnosis not present

## 2018-07-11 ENCOUNTER — Encounter: Payer: Self-pay | Admitting: Internal Medicine

## 2018-07-11 DIAGNOSIS — H5201 Hypermetropia, right eye: Secondary | ICD-10-CM | POA: Diagnosis not present

## 2018-07-11 DIAGNOSIS — E113293 Type 2 diabetes mellitus with mild nonproliferative diabetic retinopathy without macular edema, bilateral: Secondary | ICD-10-CM | POA: Diagnosis not present

## 2018-07-11 DIAGNOSIS — H33102 Unspecified retinoschisis, left eye: Secondary | ICD-10-CM | POA: Diagnosis not present

## 2018-07-11 DIAGNOSIS — Z961 Presence of intraocular lens: Secondary | ICD-10-CM | POA: Diagnosis not present

## 2018-07-11 LAB — HM DIABETES EYE EXAM

## 2018-07-22 ENCOUNTER — Inpatient Hospital Stay: Payer: Medicare Other

## 2018-07-22 ENCOUNTER — Inpatient Hospital Stay: Payer: Medicare Other | Attending: Hematology & Oncology

## 2018-07-22 VITALS — HR 88 | Temp 97.4°F | Resp 20

## 2018-07-22 DIAGNOSIS — D696 Thrombocytopenia, unspecified: Secondary | ICD-10-CM

## 2018-07-22 DIAGNOSIS — D500 Iron deficiency anemia secondary to blood loss (chronic): Secondary | ICD-10-CM

## 2018-07-22 DIAGNOSIS — D693 Immune thrombocytopenic purpura: Secondary | ICD-10-CM | POA: Diagnosis not present

## 2018-07-22 LAB — CBC WITH DIFFERENTIAL (CANCER CENTER ONLY)
Abs Immature Granulocytes: 0.03 10*3/uL (ref 0.00–0.07)
Basophils Absolute: 0 10*3/uL (ref 0.0–0.1)
Basophils Relative: 1 %
Eosinophils Absolute: 0.2 10*3/uL (ref 0.0–0.5)
Eosinophils Relative: 3 %
HCT: 35.2 % — ABNORMAL LOW (ref 36.0–46.0)
Hemoglobin: 11.6 g/dL — ABNORMAL LOW (ref 12.0–15.0)
Immature Granulocytes: 0 %
Lymphocytes Relative: 21 %
Lymphs Abs: 1.5 10*3/uL (ref 0.7–4.0)
MCH: 31.5 pg (ref 26.0–34.0)
MCHC: 33 g/dL (ref 30.0–36.0)
MCV: 95.7 fL (ref 80.0–100.0)
Monocytes Absolute: 0.5 10*3/uL (ref 0.1–1.0)
Monocytes Relative: 8 %
Neutro Abs: 4.7 10*3/uL (ref 1.7–7.7)
Neutrophils Relative %: 67 %
Platelet Count: 106 10*3/uL — ABNORMAL LOW (ref 150–400)
RBC: 3.68 MIL/uL — ABNORMAL LOW (ref 3.87–5.11)
RDW: 18.9 % — ABNORMAL HIGH (ref 11.5–15.5)
WBC Count: 7 10*3/uL (ref 4.0–10.5)
nRBC: 0 % (ref 0.0–0.2)

## 2018-07-22 LAB — CMP (CANCER CENTER ONLY)
ALT: 21 U/L (ref 0–44)
AST: 23 U/L (ref 15–41)
Albumin: 4.2 g/dL (ref 3.5–5.0)
Alkaline Phosphatase: 44 U/L (ref 38–126)
Anion gap: 15 (ref 5–15)
BUN: 38 mg/dL — ABNORMAL HIGH (ref 8–23)
CO2: 30 mmol/L (ref 22–32)
Calcium: 10.1 mg/dL (ref 8.9–10.3)
Chloride: 99 mmol/L (ref 98–111)
Creatinine: 2.45 mg/dL — ABNORMAL HIGH (ref 0.44–1.00)
GFR, Est AFR Am: 20 mL/min — ABNORMAL LOW (ref >60)
GFR, Estimated: 18 mL/min — ABNORMAL LOW (ref >60)
Glucose, Bld: 227 mg/dL — ABNORMAL HIGH (ref 70–99)
Potassium: 5 mmol/L (ref 3.5–5.1)
Sodium: 144 mmol/L (ref 135–145)
Total Bilirubin: 0.8 mg/dL (ref 0.3–1.2)
Total Protein: 8.5 g/dL — ABNORMAL HIGH (ref 6.5–8.1)

## 2018-07-22 MED ORDER — ROMIPLOSTIM INJECTION 500 MCG
570.0000 ug | Freq: Once | SUBCUTANEOUS | Status: AC
  Administered 2018-07-22: 570 ug via SUBCUTANEOUS
  Filled 2018-07-22: qty 1.14

## 2018-07-22 NOTE — Patient Instructions (Signed)
Romiplostim injection What is this medicine? ROMIPLOSTIM (roe mi PLOE stim) helps your body make more platelets. This medicine is used to treat low platelets caused by chronic idiopathic thrombocytopenic purpura (ITP). This medicine may be used for other purposes; ask your health care provider or pharmacist if you have questions. COMMON BRAND NAME(S): Nplate What should I tell my health care provider before I take this medicine? They need to know if you have any of these conditions: -cancer or myelodysplastic syndrome -low blood counts, like low white cell, platelet, or red cell counts -take medicines that treat or prevent blood clots -an unusual or allergic reaction to romiplostim, mannitol, other medicines, foods, dyes, or preservatives -pregnant or trying to get pregnant -breast-feeding How should I use this medicine? This medicine is for injection under the skin. It is given by a health care professional in a hospital or clinic setting. A special MedGuide will be given to you before your injection. Read this information carefully each time. Talk to your pediatrician regarding the use of this medicine in children. Special care may be needed. Overdosage: If you think you have taken too much of this medicine contact a poison control center or emergency room at once. NOTE: This medicine is only for you. Do not share this medicine with others. What if I miss a dose? It is important not to miss your dose. Call your doctor or health care professional if you are unable to keep an appointment. What may interact with this medicine? Interactions are not expected. This list may not describe all possible interactions. Give your health care provider a list of all the medicines, herbs, non-prescription drugs, or dietary supplements you use. Also tell them if you smoke, drink alcohol, or use illegal drugs. Some items may interact with your medicine. What should I watch for while using this  medicine? Your condition will be monitored carefully while you are receiving this medicine. Visit your prescriber or health care professional for regular checks on your progress and for the needed blood tests. It is important to keep all appointments. What side effects may I notice from receiving this medicine? Side effects that you should report to your doctor or health care professional as soon as possible: -allergic reactions like skin rash, itching or hives, swelling of the face, lips, or tongue -shortness of breath, chest pain, swelling in a leg -unusual bleeding or bruising Side effects that usually do not require medical attention (report to your doctor or health care professional if they continue or are bothersome): -dizziness -headache -muscle aches -pain in arms and legs -stomach pain -trouble sleeping This list may not describe all possible side effects. Call your doctor for medical advice about side effects. You may report side effects to FDA at 1-800-FDA-1088. Where should I keep my medicine? This drug is given in a hospital or clinic and will not be stored at home. NOTE: This sheet is a summary. It may not cover all possible information. If you have questions about this medicine, talk to your doctor, pharmacist, or health care provider.  2018 Elsevier/Gold Standard (2008-04-23 15:13:04)  

## 2018-07-28 DIAGNOSIS — I872 Venous insufficiency (chronic) (peripheral): Secondary | ICD-10-CM | POA: Diagnosis not present

## 2018-07-28 DIAGNOSIS — L0390 Cellulitis, unspecified: Secondary | ICD-10-CM | POA: Diagnosis not present

## 2018-08-02 DIAGNOSIS — E1122 Type 2 diabetes mellitus with diabetic chronic kidney disease: Secondary | ICD-10-CM | POA: Diagnosis not present

## 2018-08-08 ENCOUNTER — Telehealth: Payer: Self-pay | Admitting: Family

## 2018-08-08 NOTE — Telephone Encounter (Signed)
LMVM with my name/number for patient to call me back in order to r/s her appointments that I had cancelled for 12/6, due to Judson Roch C being out of the office for [place].

## 2018-08-10 DIAGNOSIS — I130 Hypertensive heart and chronic kidney disease with heart failure and stage 1 through stage 4 chronic kidney disease, or unspecified chronic kidney disease: Secondary | ICD-10-CM | POA: Diagnosis not present

## 2018-08-11 ENCOUNTER — Telehealth: Payer: Self-pay | Admitting: Hematology & Oncology

## 2018-08-11 ENCOUNTER — Ambulatory Visit: Payer: Medicare Other | Admitting: Cardiology

## 2018-08-11 NOTE — Telephone Encounter (Signed)
Appointments rs/ from 12/6 due to provider out of office for [place]/ patient ok with date/time

## 2018-08-12 ENCOUNTER — Other Ambulatory Visit: Payer: Medicare Other

## 2018-08-12 ENCOUNTER — Ambulatory Visit: Payer: Medicare Other

## 2018-08-12 ENCOUNTER — Ambulatory Visit: Payer: Medicare Other | Admitting: Family

## 2018-08-15 ENCOUNTER — Other Ambulatory Visit: Payer: Self-pay

## 2018-08-15 ENCOUNTER — Other Ambulatory Visit: Payer: Self-pay | Admitting: Family

## 2018-08-15 ENCOUNTER — Inpatient Hospital Stay: Payer: Medicare Other | Attending: Hematology & Oncology

## 2018-08-15 ENCOUNTER — Inpatient Hospital Stay (HOSPITAL_BASED_OUTPATIENT_CLINIC_OR_DEPARTMENT_OTHER): Payer: Medicare Other | Admitting: Family

## 2018-08-15 ENCOUNTER — Encounter: Payer: Self-pay | Admitting: Cardiology

## 2018-08-15 ENCOUNTER — Encounter: Payer: Self-pay | Admitting: Family

## 2018-08-15 ENCOUNTER — Telehealth: Payer: Self-pay

## 2018-08-15 ENCOUNTER — Inpatient Hospital Stay: Payer: Medicare Other

## 2018-08-15 VITALS — BP 116/64 | HR 102 | Temp 98.1°F | Resp 19 | Ht 61.0 in | Wt 253.5 lb

## 2018-08-15 DIAGNOSIS — D693 Immune thrombocytopenic purpura: Secondary | ICD-10-CM | POA: Insufficient documentation

## 2018-08-15 DIAGNOSIS — D500 Iron deficiency anemia secondary to blood loss (chronic): Secondary | ICD-10-CM

## 2018-08-15 DIAGNOSIS — Z79899 Other long term (current) drug therapy: Secondary | ICD-10-CM | POA: Diagnosis not present

## 2018-08-15 DIAGNOSIS — Z86711 Personal history of pulmonary embolism: Secondary | ICD-10-CM | POA: Diagnosis not present

## 2018-08-15 DIAGNOSIS — D696 Thrombocytopenia, unspecified: Secondary | ICD-10-CM

## 2018-08-15 DIAGNOSIS — Z794 Long term (current) use of insulin: Secondary | ICD-10-CM | POA: Insufficient documentation

## 2018-08-15 DIAGNOSIS — D509 Iron deficiency anemia, unspecified: Secondary | ICD-10-CM

## 2018-08-15 DIAGNOSIS — I4891 Unspecified atrial fibrillation: Secondary | ICD-10-CM | POA: Insufficient documentation

## 2018-08-15 LAB — CBC WITH DIFFERENTIAL (CANCER CENTER ONLY)
Abs Immature Granulocytes: 0.09 10*3/uL — ABNORMAL HIGH (ref 0.00–0.07)
Basophils Absolute: 0 10*3/uL (ref 0.0–0.1)
Basophils Relative: 0 %
Eosinophils Absolute: 0.2 10*3/uL (ref 0.0–0.5)
Eosinophils Relative: 3 %
HCT: 32.7 % — ABNORMAL LOW (ref 36.0–46.0)
Hemoglobin: 10.3 g/dL — ABNORMAL LOW (ref 12.0–15.0)
Immature Granulocytes: 1 %
Lymphocytes Relative: 18 %
Lymphs Abs: 1.3 10*3/uL (ref 0.7–4.0)
MCH: 30.5 pg (ref 26.0–34.0)
MCHC: 31.5 g/dL (ref 30.0–36.0)
MCV: 96.7 fL (ref 80.0–100.0)
Monocytes Absolute: 0.5 10*3/uL (ref 0.1–1.0)
Monocytes Relative: 7 %
Neutro Abs: 5.2 10*3/uL (ref 1.7–7.7)
Neutrophils Relative %: 71 %
Platelet Count: 18 10*3/uL — ABNORMAL LOW (ref 150–400)
RBC: 3.38 MIL/uL — ABNORMAL LOW (ref 3.87–5.11)
RDW: 18.4 % — ABNORMAL HIGH (ref 11.5–15.5)
WBC Count: 7.3 10*3/uL (ref 4.0–10.5)
nRBC: 0 % (ref 0.0–0.2)

## 2018-08-15 LAB — CMP (CANCER CENTER ONLY)
ALT: 13 U/L (ref 0–44)
AST: 17 U/L (ref 15–41)
Albumin: 4.3 g/dL (ref 3.5–5.0)
Alkaline Phosphatase: 39 U/L (ref 38–126)
Anion gap: 9 (ref 5–15)
BUN: 42 mg/dL — ABNORMAL HIGH (ref 8–23)
CO2: 31 mmol/L (ref 22–32)
Calcium: 9.9 mg/dL (ref 8.9–10.3)
Chloride: 99 mmol/L (ref 98–111)
Creatinine: 2.23 mg/dL — ABNORMAL HIGH (ref 0.44–1.00)
GFR, Est AFR Am: 23 mL/min — ABNORMAL LOW (ref >60)
GFR, Estimated: 20 mL/min — ABNORMAL LOW (ref >60)
Glucose, Bld: 205 mg/dL — ABNORMAL HIGH (ref 70–99)
Potassium: 4.6 mmol/L (ref 3.5–5.1)
Sodium: 139 mmol/L (ref 135–145)
Total Bilirubin: 0.6 mg/dL (ref 0.3–1.2)
Total Protein: 7.2 g/dL (ref 6.5–8.1)

## 2018-08-15 MED ORDER — ROMIPLOSTIM INJECTION 500 MCG
6.0000 ug/kg | Freq: Once | SUBCUTANEOUS | Status: AC
  Administered 2018-08-15: 690 ug via SUBCUTANEOUS
  Filled 2018-08-15: qty 0.5

## 2018-08-15 NOTE — Telephone Encounter (Signed)
Appointments scheduled avs/calendar printed per 12/9 los °

## 2018-08-15 NOTE — Patient Instructions (Signed)
Romiplostim injection What is this medicine? ROMIPLOSTIM (roe mi PLOE stim) helps your body make more platelets. This medicine is used to treat low platelets caused by chronic idiopathic thrombocytopenic purpura (ITP). This medicine may be used for other purposes; ask your health care provider or pharmacist if you have questions. COMMON BRAND NAME(S): Nplate What should I tell my health care provider before I take this medicine? They need to know if you have any of these conditions: -cancer or myelodysplastic syndrome -low blood counts, like low white cell, platelet, or red cell counts -take medicines that treat or prevent blood clots -an unusual or allergic reaction to romiplostim, mannitol, other medicines, foods, dyes, or preservatives -pregnant or trying to get pregnant -breast-feeding How should I use this medicine? This medicine is for injection under the skin. It is given by a health care professional in a hospital or clinic setting. A special MedGuide will be given to you before your injection. Read this information carefully each time. Talk to your pediatrician regarding the use of this medicine in children. Special care may be needed. Overdosage: If you think you have taken too much of this medicine contact a poison control center or emergency room at once. NOTE: This medicine is only for you. Do not share this medicine with others. What if I miss a dose? It is important not to miss your dose. Call your doctor or health care professional if you are unable to keep an appointment. What may interact with this medicine? Interactions are not expected. This list may not describe all possible interactions. Give your health care provider a list of all the medicines, herbs, non-prescription drugs, or dietary supplements you use. Also tell them if you smoke, drink alcohol, or use illegal drugs. Some items may interact with your medicine. What should I watch for while using this  medicine? Your condition will be monitored carefully while you are receiving this medicine. Visit your prescriber or health care professional for regular checks on your progress and for the needed blood tests. It is important to keep all appointments. What side effects may I notice from receiving this medicine? Side effects that you should report to your doctor or health care professional as soon as possible: -allergic reactions like skin rash, itching or hives, swelling of the face, lips, or tongue -shortness of breath, chest pain, swelling in a leg -unusual bleeding or bruising Side effects that usually do not require medical attention (report to your doctor or health care professional if they continue or are bothersome): -dizziness -headache -muscle aches -pain in arms and legs -stomach pain -trouble sleeping This list may not describe all possible side effects. Call your doctor for medical advice about side effects. You may report side effects to FDA at 1-800-FDA-1088. Where should I keep my medicine? This drug is given in a hospital or clinic and will not be stored at home. NOTE: This sheet is a summary. It may not cover all possible information. If you have questions about this medicine, talk to your doctor, pharmacist, or health care provider.  2018 Elsevier/Gold Standard (2008-04-23 15:13:04)  

## 2018-08-15 NOTE — Progress Notes (Signed)
Hematology and Oncology Follow Up Visit  [name] [id number] 01/15/1935 83 y.o. 08/15/2018   Principle Diagnosis:  Immune based thrombocytopenia History of PE Iron deficiency anemia Atrial fib  Current Therapy:   Nplate as indicated IV Iron as indicated - dose given on 07/07/2017   Interim History:  [name] is here today for follow-up and Nplate. She was recently put on an oral antibiotic as well as an antibiotic ointment for vasculitis of both legs. Her platelet count has dropped down to 18 (previously 106), Hgb is 10.3, WBC count is 7.3.  She has had some bruising across the abdomen where she administers her insulin as well as some bleeding around her gums at times when brushing.  No other episodes of bleeding noted. No petechiae.  She has had no fever, chills, n/v, cough, rash, dizziness, SOB, chest pain, palpitations, abdominal pain or changes in bowel or bladder habits.  The swelling and redness in her legs have improved. No numbness or tingling I her extremities at this time.  No lymphadenopathy noted on exam.  She has maintained a good appetite and is staying well hydrated. Her weight is stable.   ECOG Performance Status: 1 - Symptomatic but completely ambulatory  Medications:  Allergies as of 08/15/2018      Reactions   Apixaban Other (See Comments)   Internal Bleeding Patient states internal bleeding.   Aspirin Itching, Rash, Hives, Swelling   Swelling of her tongue   Mirabegron Other (See Comments)   Patient experienced A-Fib   Metformin Diarrhea, Nausea Only   Pineapple Swelling   Throat swells and blisters on tongue and roof of mouth per patient   Tetracycline Hives   Fluticasone-salmeterol Itching, Rash   Iodinated Diagnostic Agents Itching, Rash      Lactose Intolerance (gi) Other (See Comments)   gas   Latex Itching, Rash, Other (See Comments)   Pt. States latex pulls her skin off.    Lisinopril Cough   Metronidazole Other (See Comments)   Unknown   Sulfa Antibiotics Rash   Sulfonamide Derivatives Itching, Rash      Medication List        Accurate as of 08/15/18  3:40 PM. Always use your most recent med list.          acetaminophen 325 MG tablet Commonly known as:  TYLENOL Take 650 mg by mouth every 6 (six) hours as needed.   atorvastatin 40 MG tablet Commonly known as:  LIPITOR Take 40 mg by mouth daily.   BANOPHEN 25 MG tablet Generic drug:  diphenhydrAMINE Take 25 mg by mouth daily.   bisacodyl 5 MG EC tablet Commonly known as:  DULCOLAX Take 5 mg by mouth daily as needed for moderate constipation. As needed every three days   CALCIUM 1200 PO Take 1 tablet by mouth daily.   diltiazem 180 MG 24 hr capsule Commonly known as:  CARDIZEM CD Take 1 capsule (180 mg total) by mouth daily.   docusate sodium 100 MG capsule Commonly known as:  COLACE Take 1 capsule by mouth daily.   fenofibrate 54 MG tablet TAKE 1 TABLET(54 MG) BY MOUTH DAILY   fexofenadine 180 MG tablet Commonly known as:  ALLEGRA Take 180 mg by mouth daily.   Flaxseed Oil 1000 MG Caps Take by mouth 2 (two) times daily.   furosemide 20 MG tablet Commonly known as:  LASIX Take 2 tablets (40 mg total) by mouth daily as needed.   glipiZIDE 10 MG tablet Commonly  known as:  GLUCOTROL Take 1 tablet (10 mg total) by mouth 2 (two) times daily before a meal.   HUMALOG [place] Inject 30-35 Units into the skin.   insulin aspart 100 UNIT/ML injection Commonly known as:  novoLOG Inject into the skin.   insulin glargine 100 UNIT/ML injection Commonly known as:  LANTUS Inject 0.8 mLs (80 Units total) into the skin daily.   Insulin Pen Needle 31G X 5 MM Misc Use daily for insulin injection   INSULIN SYRINGE 1CC/30GX5/16" 30G X 5/16" 1 ML Misc USE BID UTD   levothyroxine 112 MCG tablet Commonly known as:  SYNTHROID, LEVOTHROID TAKE 1 TABLET BY MOUTH DAILY   multivitamin tablet Take 1 tablet by mouth daily.   potassium citrate 10 MEQ  (1080 MG) SR tablet Commonly known as:  UROCIT-K TK 2 TABLETS PO BID WITH MEALS   PROBIOTIC PO Take 1 capsule by mouth daily.   ranitidine 150 MG tablet Commonly known as:  ZANTAC Take 150 mg by mouth daily as needed for heartburn.   sertraline 100 MG tablet Commonly known as:  ZOLOFT Take 1 tablet by mouth daily.   vitamin C 250 MG tablet Commonly known as:  ASCORBIC ACID Take 250 mg by mouth daily.   Vitamin D3 50 MCG (2000 UT) Tabs Take 2,000 Units by mouth daily.       Allergies:  Allergies  Allergen Reactions  . Apixaban Other (See Comments)    Internal Bleeding  Patient states internal bleeding.  . Aspirin Itching, Rash, Hives and Swelling    Swelling of her tongue  . Mirabegron Other (See Comments)    Patient experienced A-Fib  . Metformin Diarrhea and Nausea Only  . Pineapple Swelling    Throat swells and blisters on tongue and roof of mouth per patient  . Tetracycline Hives  . Fluticasone-Salmeterol Itching and Rash  . Iodinated Diagnostic Agents Itching and Rash       . Lactose Intolerance (Gi) Other (See Comments)    gas  . Latex Itching, Rash and Other (See Comments)    Pt. States latex pulls her skin off.   . Lisinopril Cough  . Metronidazole Other (See Comments)    Unknown   . Sulfa Antibiotics Rash  . Sulfonamide Derivatives Itching and Rash    Past Medical History, Surgical history, Social history, and Family History were reviewed and updated.  Review of Systems: All other 10 point review of systems is negative.   Physical Exam:  height is 5\' 1"  (1.549 m) and weight is 253 lb 8 oz (115 kg). Her oral temperature is 98.1 F (36.7 C). Her blood pressure is 116/64 and her pulse is 102 (abnormal). Her respiration is 19 and oxygen saturation is 96%.   Wt Readings from Last 3 Encounters:  08/15/18 253 lb 8 oz (115 kg)  07/04/18 255 lb 12.8 oz (116 kg)  07/01/18 253 lb 12.8 oz (115.1 kg)    Ocular: Sclerae unicteric, pupils equal, round  and reactive to light Ear-nose-throat: Oropharynx clear, dentition fair Lymphatic: No cervical, supraclavicular or axillary adenopathy Lungs no rales or rhonchi, good excursion bilaterally Heart regular rate and rhythm, no murmur appreciated Abd soft, nontender, positive bowel sounds, no liver or spleen tip palpated on exam, no fluid wave MSK no focal spinal tenderness, no joint edema Neuro: non-focal, well-oriented, appropriate affect Breasts: Deferred    Lab Results  Component Value Date   WBC 7.3 08/15/2018   HGB 10.3 (L) 08/15/2018   HCT 32.7 (  L) 08/15/2018   MCV 96.7 08/15/2018   PLT 18 (L) 08/15/2018   Lab Results  Component Value Date   FERRITIN 204 07/01/2018   IRON 97 07/01/2018   TIBC 388 07/01/2018   UIBC 291 07/01/2018   IRONPCTSAT 25 07/01/2018   Lab Results  Component Value Date   RETICCTPCT 1.9 01/28/2011   RBC 3.38 (L) 08/15/2018   RETICCTABS 73.3 01/28/2011   No results found for: KPAFRELGTCHN, LAMBDASER, KAPLAMBRATIO No results found for: Kandis Cocking, IGMSERUM No results found for: Odetta Pink, SPEI   Chemistry      Component Value Date/Time   NA 139 08/15/2018 1422   NA 145 09/08/2017 1317   NA 142 02/25/2017 1013   K 4.6 08/15/2018 1422   K 4.5 09/08/2017 1317   K 3.8 02/25/2017 1013   CL 99 08/15/2018 1422   CL 98 09/08/2017 1317   CO2 31 08/15/2018 1422   CO2 32 09/08/2017 1317   CO2 26 02/25/2017 1013   BUN 42 (H) 08/15/2018 1422   BUN 34 (H) 09/08/2017 1317   BUN 20.1 02/25/2017 1013   CREATININE 2.23 (H) 08/15/2018 1422   CREATININE 1.9 (H) 09/08/2017 1317   CREATININE 1.8 (H) 02/25/2017 1013      Component Value Date/Time   CALCIUM 9.9 08/15/2018 1422   CALCIUM 9.3 09/08/2017 1317   CALCIUM 8.7 02/25/2017 1013   ALKPHOS 39 08/15/2018 1422   ALKPHOS 45 09/08/2017 1317   ALKPHOS 65 02/25/2017 1013   AST 17 08/15/2018 1422   AST 16 02/25/2017 1013   ALT 13 08/15/2018 1422     ALT 26 09/08/2017 1317   ALT 13 02/25/2017 1013   BILITOT 0.6 08/15/2018 1422   BILITOT 0.76 02/25/2017 1013       Impression and Plan: [name] is a very pleasant 83 yo caucasian female with immune based thrombocytopenia. Her platelet count has dropped to 18 after being on an antibiotic for vasculitis of both lower extremities.  She was given Nplate today and we will see her back in another 2 weeks for lab and injection.  We will plan see her back for follow-up in another 6 weeks but this is subject to change if platelet count is still low in 2 weeks.  She will contact our office with any questions or concerns and go to the ED in the event of an emergency. We can certainly see her sooner if need be.   Laverna Peace, NP 12/9/20193:40 PM

## 2018-08-16 LAB — IRON AND TIBC
Iron: 89 ug/dL (ref 41–142)
Saturation Ratios: 22 % (ref 21–57)
TIBC: 410 ug/dL (ref 236–444)
UIBC: 321 ug/dL (ref 120–384)

## 2018-08-16 LAB — FERRITIN: Ferritin: 182 ng/mL (ref 11–307)

## 2018-08-17 DIAGNOSIS — I10 Essential (primary) hypertension: Secondary | ICD-10-CM | POA: Diagnosis not present

## 2018-08-26 ENCOUNTER — Inpatient Hospital Stay: Payer: Medicare Other

## 2018-08-26 VITALS — BP 123/61 | HR 90 | Temp 97.6°F | Resp 18

## 2018-08-26 DIAGNOSIS — D693 Immune thrombocytopenic purpura: Secondary | ICD-10-CM

## 2018-08-26 DIAGNOSIS — I4891 Unspecified atrial fibrillation: Secondary | ICD-10-CM | POA: Diagnosis not present

## 2018-08-26 DIAGNOSIS — D509 Iron deficiency anemia, unspecified: Secondary | ICD-10-CM | POA: Diagnosis not present

## 2018-08-26 DIAGNOSIS — Z86711 Personal history of pulmonary embolism: Secondary | ICD-10-CM | POA: Diagnosis not present

## 2018-08-26 DIAGNOSIS — Z79899 Other long term (current) drug therapy: Secondary | ICD-10-CM | POA: Diagnosis not present

## 2018-08-26 DIAGNOSIS — Z794 Long term (current) use of insulin: Secondary | ICD-10-CM | POA: Diagnosis not present

## 2018-08-26 DIAGNOSIS — D696 Thrombocytopenia, unspecified: Secondary | ICD-10-CM

## 2018-08-26 LAB — CBC WITH DIFFERENTIAL (CANCER CENTER ONLY)
Abs Immature Granulocytes: 0.05 10*3/uL (ref 0.00–0.07)
Basophils Absolute: 0 10*3/uL (ref 0.0–0.1)
Basophils Relative: 0 %
Eosinophils Absolute: 0.2 10*3/uL (ref 0.0–0.5)
Eosinophils Relative: 3 %
HCT: 33.3 % — ABNORMAL LOW (ref 36.0–46.0)
Hemoglobin: 10.4 g/dL — ABNORMAL LOW (ref 12.0–15.0)
Immature Granulocytes: 1 %
Lymphocytes Relative: 18 %
Lymphs Abs: 1.2 10*3/uL (ref 0.7–4.0)
MCH: 30.2 pg (ref 26.0–34.0)
MCHC: 31.2 g/dL (ref 30.0–36.0)
MCV: 96.8 fL (ref 80.0–100.0)
Monocytes Absolute: 0.5 10*3/uL (ref 0.1–1.0)
Monocytes Relative: 7 %
Neutro Abs: 4.7 10*3/uL (ref 1.7–7.7)
Neutrophils Relative %: 71 %
Platelet Count: 27 10*3/uL — ABNORMAL LOW (ref 150–400)
RBC: 3.44 MIL/uL — ABNORMAL LOW (ref 3.87–5.11)
RDW: 19.2 % — ABNORMAL HIGH (ref 11.5–15.5)
WBC Count: 6.6 10*3/uL (ref 4.0–10.5)
nRBC: 0 % (ref 0.0–0.2)

## 2018-08-26 LAB — CMP (CANCER CENTER ONLY)
ALT: 13 U/L (ref 0–44)
AST: 19 U/L (ref 15–41)
Albumin: 4.5 g/dL (ref 3.5–5.0)
Alkaline Phosphatase: 37 U/L — ABNORMAL LOW (ref 38–126)
Anion gap: 8 (ref 5–15)
BUN: 44 mg/dL — ABNORMAL HIGH (ref 8–23)
CO2: 34 mmol/L — ABNORMAL HIGH (ref 22–32)
Calcium: 9.8 mg/dL (ref 8.9–10.3)
Chloride: 100 mmol/L (ref 98–111)
Creatinine: 2.25 mg/dL — ABNORMAL HIGH (ref 0.44–1.00)
GFR, Est AFR Am: 23 mL/min — ABNORMAL LOW (ref >60)
GFR, Estimated: 20 mL/min — ABNORMAL LOW (ref >60)
Glucose, Bld: 190 mg/dL — ABNORMAL HIGH (ref 70–99)
Potassium: 4.5 mmol/L (ref 3.5–5.1)
Sodium: 142 mmol/L (ref 135–145)
Total Bilirubin: 0.7 mg/dL (ref 0.3–1.2)
Total Protein: 7.2 g/dL (ref 6.5–8.1)

## 2018-08-26 MED ORDER — ROMIPLOSTIM INJECTION 500 MCG
7.0000 ug/kg | Freq: Once | SUBCUTANEOUS | Status: AC
  Administered 2018-08-26: 805 ug via SUBCUTANEOUS
  Filled 2018-08-26: qty 1.61

## 2018-08-29 ENCOUNTER — Ambulatory Visit: Payer: Medicare Other

## 2018-08-29 ENCOUNTER — Other Ambulatory Visit: Payer: Medicare Other

## 2018-09-02 ENCOUNTER — Encounter: Payer: Self-pay | Admitting: Cardiology

## 2018-09-05 DIAGNOSIS — I10 Essential (primary) hypertension: Secondary | ICD-10-CM | POA: Diagnosis not present

## 2018-09-08 ENCOUNTER — Ambulatory Visit (INDEPENDENT_AMBULATORY_CARE_PROVIDER_SITE_OTHER): Payer: Medicare Other | Admitting: Internal Medicine

## 2018-09-08 ENCOUNTER — Encounter: Payer: Self-pay | Admitting: Internal Medicine

## 2018-09-08 ENCOUNTER — Telehealth: Payer: Self-pay

## 2018-09-08 VITALS — BP 130/68 | HR 102 | Ht 61.0 in | Wt 256.0 lb

## 2018-09-08 DIAGNOSIS — Z6841 Body Mass Index (BMI) 40.0 and over, adult: Secondary | ICD-10-CM

## 2018-09-08 DIAGNOSIS — E7800 Pure hypercholesterolemia, unspecified: Secondary | ICD-10-CM | POA: Diagnosis not present

## 2018-09-08 DIAGNOSIS — Z794 Long term (current) use of insulin: Secondary | ICD-10-CM | POA: Diagnosis not present

## 2018-09-08 DIAGNOSIS — E1122 Type 2 diabetes mellitus with diabetic chronic kidney disease: Secondary | ICD-10-CM

## 2018-09-08 DIAGNOSIS — N184 Chronic kidney disease, stage 4 (severe): Secondary | ICD-10-CM

## 2018-09-08 DIAGNOSIS — E890 Postprocedural hypothyroidism: Secondary | ICD-10-CM | POA: Diagnosis not present

## 2018-09-08 LAB — POCT GLYCOSYLATED HEMOGLOBIN (HGB A1C): Hemoglobin A1C: 7.3 % — AB (ref 4.0–5.6)

## 2018-09-08 MED ORDER — DULAGLUTIDE 1.5 MG/0.5ML ~~LOC~~ SOAJ: 1.5000 mg | SUBCUTANEOUS | 11 refills | Status: DC

## 2018-09-08 NOTE — Telephone Encounter (Signed)
PA initiated for Trulicity QZE:[id number]

## 2018-09-08 NOTE — Progress Notes (Signed)
Patient ID: [name], female   DOB: 27-Apr-1935, 84 y.o.   MRN: [id number]   HPI: [name] is a 84 y.o.-year-old female, returning for f/u for DM2, dx in late 1990s, insulin-dependent since 2012, uncontrolled, with complications (CKD stage 4, CHF, + DR) and hypothyroidism. She prev. Saw Dr. Chalmers Cater. Last visit with me 3.5 months ago.  She is here with her daughter who offers part of the history especially regarding patient's insulin doses, blood sugars, past medical history.  She still resides in [facility].  She has immune Tc-penia and anemia.  DM2:  Last hemoglobin A1c was: Lab Results  Component Value Date   HGBA1C 7.4 (A) 05/19/2018   HGBA1C 8.3 (A) 03/22/2018   HGBA1C 8.2 11/18/2017  08/19/2017: HbA1c calculated from fructosamine is 6.7%  She was on: - Glipizide 10 mg 2x a day before meals  Insulin Before breakfast Before lunch Before dinner  Regular  25  10   25   NPH  50   50  Please inject the insulin 30 min before meals.  At last visit, she was on -in the facility (!!!): - Glipizide 5 mg before b'fast - Lantus 10 units at night - NovoLog target 200, ISF 25  Currently on: - Glipizide 10 mg 2x a day before meals - Lantus 82  >> 82 units at bedtime - Humalog 30 >> 35 units before breakfast 25 >> 30 units before lunch 30 >> 35 units before dinner   Pt checks her sugars > or = 4x a day: - am:  295-427 >> 99, 262-343 >> 192-264 >> 158-252, 268 - 2h after b'fast: n/c >> 290 >> n/c - before lunch: 269-362 >> 187-232, 286 >> 157-233, 279, 298 - 2h after lunch: n/c >> 164, 228 - before dinner: 207-364, 431 >> 144-273 >> 149-238, 260 - 2h after dinner: n/c >> 261 >> n/c - bedtime:207-349, 391 >> 182-296 >> 144-244, 277, 310 - nighttime: n/c Lowest sugar was 295 >> 99 x1 >> 144 >> 143; she has hypoglycemia awareness at 100. Highest sugar was 559 >> 431 >> 296 >> 310.  Glucometer: True Metrix air >> Freestyle Libre CGM  Pt's meals are: -  Breakfast: grits, bacon, green tea + stevia >> eggs + bacon + toast/grits + fruit - Lunch: 1/2 sandwich + salad + chips and drink >> 1/2 sandwich - snack: fruit - Dinner:meat + veggies + occas. Starch >> meat + veggies - Snacks: 2 - usually salty  -+ stage 4 CKD, last BUN/creatinine:  Lab Results  Component Value Date   BUN 44 (H) 08/26/2018   BUN 42 (H) 08/15/2018   CREATININE 2.25 (H) 08/26/2018   CREATININE 2.23 (H) 08/15/2018  03/15/2018: 47/2.34, GFR 19, Glu 283  GFR is low: Lab Results  Component Value Date   GFRNONAA 20 (L) 08/26/2018   GFRNONAA 20 (L) 08/15/2018   GFRNONAA 18 (L) 07/22/2018   GFRNONAA 20 (L) 07/01/2018   GFRNONAA 20 (L) 06/10/2018   GFRNONAA 19 (L) 05/20/2018   GFRNONAA 18 (L) 04/29/2018   GFRNONAA 19 (L) 04/08/2018   GFRNONAA 14 (L) 01/21/2018   GFRNONAA 21 (L) 12/31/2017  He has a h/o uric acid kidney stones.  -+ HLL. last set of lipids: Lab Results  Component Value Date   CHOL 131 05/19/2018   HDL 26.70 (L) 05/19/2018   LDLCALC 65 08/16/2009   LDLDIRECT 41.0 05/19/2018   TRIG (H) 05/19/2018    656.0 Triglyceride is over 400; calculations on  Lipids are invalid.   CHOLHDL 5 05/19/2018  On Lipitor 40, Fenofibrate 45, flax seed oil 1000 mg bid. - last eye exam was in 05/2018: + DR - improved -+ Numbness and tingling in her toes  She has a h/o urinary incontinence >> started Myrbetriq >> developed A fib >> started Eloquis >> had bleeding >> received blood transfusion.  Postsurgical (1960s) and postablative (1977 and 1999) hypothyroidism   Pt is on levothyroxine 112 mcg daily, taken: - in am - fasting - at least 30 min from b'fast - no Ca, Fe, MVI, PPIs - we moved Zantac later in the day at last OV - not on Biotin anymore  Last TSH was normal: Lab Results  Component Value Date   TSH 0.92 05/19/2018   She had a mild MI at the beginning of 2019 and also has CHF.   ROS: Constitutional: no weight gain/no weight loss, no fatigue, no  subjective hyperthermia, no subjective hypothermia Eyes: no blurry vision, no xerophthalmia ENT: no sore throat, no nodules palpated in neck, no dysphagia, no odynophagia, no hoarseness Cardiovascular: no CP/no SOB/no palpitations/no leg swelling Respiratory: no cough/no SOB/no wheezing Gastrointestinal: no N/no V/no D/no C/no acid reflux Musculoskeletal: no muscle aches/no joint aches Skin: no rashes, no hair loss Neurological: no tremors/+ numbness/+ tingling/no dizziness  I reviewed pt's medications, allergies, PMH, social hx, family hx, and changes were documented in the history of present illness. Otherwise, unchanged from my initial visit note.  Past Medical History:  Diagnosis Date  . Allergic rhinitis   . Anxiety state, unspecified    panic attacks  . CHF (congestive heart failure) ([place])   . Depressive disorder, not elsewhere classified   . Extrinsic asthma, unspecified    no problem since adulthood  . Obesity   . OSA on CPAP    severe  . Pure hypercholesterolemia   . Respiratory failure with hypoxia ([place]) 09/2008   acute, secondary to multiple bilateral pulmonary embolism , negative hypercoagulable workup 09/2008 hospital stay  . Scoliosis   . Syncope and collapse   . Type II or unspecified type diabetes mellitus without mention of complication, not stated as uncontrolled   . Unspecified essential hypertension   . Unspecified hypothyroidism    hypo   Past Surgical History:  Procedure Laterality Date  . EYE SURGERY    . JOINT REPLACEMENT    . knee replaced    . THYROID SURGERY     Social History   Social History  . Marital status: Widowed    Spouse name: N/A  . Number of children: 2   Occupational History  . 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 Smoking status: Former Smoker    Years: 20.00    Quit date: 09/07/1989  . Smokeless tobacco: Never Used  . Alcohol use No  . Drug use: No   Current Outpatient  Medications on File Prior to Visit  Medication Sig Dispense Refill  . acetaminophen (TYLENOL) 325 MG tablet Take 650 mg by mouth every 6 (six) hours as needed.    Marland Kitchen atorvastatin (LIPITOR) 40 MG tablet Take 40 mg by mouth daily.    . bisacodyl (DULCOLAX) 5 MG EC tablet Take 5 mg by mouth daily as needed for moderate constipation. As needed every three days    . Calcium Carbonate-Vit D-Min (CALCIUM 1200 PO) Take 1 tablet by mouth daily.    . Cholecalciferol (VITAMIN D3) 2000 units  TABS Take 2,000 Units by mouth daily.    Marland Kitchen diltiazem (CARDIZEM CD) 180 MG 24 hr capsule Take 1 capsule (180 mg total) by mouth daily. 90 capsule 3  . diphenhydrAMINE (BANOPHEN) 25 MG tablet Take 25 mg by mouth daily.    Marland Kitchen docusate sodium (COLACE) 100 MG capsule Take 1 capsule by mouth daily.    . fenofibrate 54 MG tablet TAKE 1 TABLET(54 MG) BY MOUTH DAILY 90 tablet 1  . fexofenadine (ALLEGRA) 180 MG tablet Take 180 mg by mouth daily.      . Flaxseed, Linseed, (FLAXSEED OIL) 1000 MG CAPS Take by mouth 2 (two) times daily.    . furosemide (LASIX) 20 MG tablet Take 2 tablets (40 mg total) by mouth daily as needed. (Patient taking differently: Take 40 mg by mouth daily as needed. Three times weekly) 90 tablet 1  . glipiZIDE (GLUCOTROL) 10 MG tablet Take 1 tablet (10 mg total) by mouth 2 (two) times daily before a meal. 180 tablet 1  . insulin aspart (NOVOLOG) 100 UNIT/ML injection Inject into the skin.    Marland Kitchen insulin glargine (LANTUS) 100 UNIT/ML injection Inject 0.8 mLs (80 Units total) into the skin daily. 30 mL 3  . Insulin Lispro (HUMALOG [place]) Inject 30-35 Units into the skin.    . Insulin Pen Needle (B-D UF III MINI PEN NEEDLES) 31G X 5 MM MISC Use daily for insulin injection 100 each 2  . Insulin Syringe-Needle U-100 (INSULIN SYRINGE 1CC/30GX5/16") 30G X 5/16" 1 ML MISC USE BID UTD  11  . levothyroxine (SYNTHROID, LEVOTHROID) 112 MCG tablet TAKE 1 TABLET BY MOUTH DAILY 90 tablet 0  . Multiple Vitamin (MULTIVITAMIN)  tablet Take 1 tablet by mouth daily.      . potassium citrate (UROCIT-K) 10 MEQ (1080 MG) SR tablet TK 2 TABLETS PO BID WITH MEALS  4  . Probiotic Product (PROBIOTIC PO) Take 1 capsule by mouth daily.    . ranitidine (ZANTAC) 150 MG tablet Take 150 mg by mouth daily as needed for heartburn.    . sertraline (ZOLOFT) 100 MG tablet Take 1 tablet by mouth daily.  4  . vitamin C (ASCORBIC ACID) 250 MG tablet Take 250 mg by mouth daily.     No current facility-administered medications on file prior to visit.    Allergies  Allergen Reactions  . Apixaban Other (See Comments)    Internal Bleeding  Patient states internal bleeding.  . Aspirin Itching, Rash, Hives and Swelling    Swelling of her tongue  . Mirabegron Other (See Comments)    Patient experienced A-Fib  . Metformin Diarrhea and Nausea Only  . Pineapple Swelling    Throat swells and blisters on tongue and roof of mouth per patient  . Tetracycline Hives  . Fluticasone-Salmeterol Itching and Rash  . Iodinated Diagnostic Agents Itching and Rash       . Lactose Intolerance (Gi) Other (See Comments)    gas  . Latex Itching, Rash and Other (See Comments)    Pt. States latex pulls her skin off.   . Lisinopril Cough  . Metronidazole Other (See Comments)    Unknown   . Sulfa Antibiotics Rash  . Sulfonamide Derivatives Itching and Rash   Family History  Problem Relation Age of Onset  . Allergies Mother   . Clotting disorder Mother   . Osteoarthritis Mother   . Asthma Mother   . Arthritis Other   . Diabetes Other   . Hyperlipidemia Other   .  Hypertension Other   . Coronary artery disease Other   . Stroke Other   . Osteoarthritis Daughter   . Rheum arthritis Maternal Grandmother   . Clotting disorder Maternal Grandmother   . Clotting disorder Maternal Uncle   . Clotting disorder Daughter   . Allergies Daughter   . Breast cancer Neg Hx    PE: BP 130/68   Pulse (!) 102   Ht 5\' 1"  (1.549 m)   Wt 256 lb (116.1 kg)    LMP  (LMP Unknown)   SpO2 92%   BMI 48.37 kg/m  Wt Readings from Last 3 Encounters:  09/08/18 256 lb (116.1 kg)  08/15/18 253 lb 8 oz (115 kg)  07/04/18 255 lb 12.8 oz (116 kg)   Constitutional: overweight, in NAD, walks with a walker Eyes: PERRLA, EOMI, no exophthalmos ENT: moist mucous membranes, no thyromegaly, no cervical lymphadenopathy Cardiovascular: tachycardia, RR, No RG, +1/6 SEM, + B LE pitting edema Respiratory: CTA B Gastrointestinal: abdomen soft, NT, ND, BS+ Musculoskeletal: no deformities, strength intact in all 4 Skin: moist, warm, no rashes Neurological: no tremor with outstretched hands, DTR normal in all 4  ASSESSMENT: 1. DM2, insulin-dependent, uncontrolled, with complications - CKD stage 4 - CHF - + DR  2. Obesity class 3  3.  Hypothyroidism  4. Hyperlipidemia  PLAN:  1. Patient with longstanding, uncontrolled, type 2 diabetes, insulin-dependent, previously on premixed insulin and glipizide, with much higher sugars while in [facility] facility, but improved after switching to Lantus and NovoLog.  HbA1c was better, at 7.4%.  We increased the doses at last visit.  She also started on a freestyle libre CGM which she feels helped. -At this visit, sugars are still high, may be slightly improved from last visit. -We discussed about adding a GLP-1 receptor agonist to help with mealtime but also with fasting blood sugars.  I suggested Trulicity, however, if this is not covered, we should try Ozempic.  She agrees to try this.  I gave her a sample of Trulicity for 2 pens of 0.75 mg and she should use these once a week for 2 weeks.  Afterwards, I sent a prescription for 1.5 mg to her pharmacy.  If a GLP-1 receptor agonist is not affordable, I advised her to add another dose of Lantus in the morning, at 20 units.  We will continue the current dose of Humalog and glipizide. - I suggested to:  Patient Instructions  Please continue: - Glipizide 10 mg 2x a day before  meals - Lantus 80 units at bedtime - Humalog  35 units before breakfast 30 units before lunch 35 units before dinner  Please start Trulicity 3.82 mg weekly x 2 weeks, then increase to 1.5 mg weekly. Take this on Sunday morning, before b'fast.  If this is not affordable, may need to add 20 units of Lantus in am.  Please continue Levothyroxine 112 mcg daily.  Take the thyroid hormone every day, with water, at least 30 minutes before breakfast, separated by at least 4 hours from: - acid reflux medications - calcium - iron - multivitamins  Please return in 3 months with your sugar log.  - today, HbA1c is 7.3% (slightly lower) - continue checking sugars at different times of the day - check 4x a day, rotating checks - advised for yearly eye exams >> she is UTD - Return to clinic in 3 mo with sugar log      2. Obesity class 3 - prev. On a plant-based  diet   3. HL - Reviewed latest lipid panel deom 05/2018: TG very high, LDL at goal, HDL low Lab Results  Component Value Date   CHOL 131 05/19/2018   HDL 26.70 (L) 05/19/2018   LDLCALC 65 08/16/2009   LDLDIRECT 41.0 05/19/2018   TRIG (H) 05/19/2018    656.0 Triglyceride is over 400; calculations on Lipids are invalid.   CHOLHDL 5 05/19/2018  - Continues the statin without side effects. We also added omega 3 FAs at last visit >> 1000 mg bid.  4.  Hypothyroidism  - latest thyroid labs reviewed with pt >> normal 05/2018 - she continues on LT4 112 mcg daily - pt feels good on this dose. - we discussed about taking the thyroid hormone every day, with water, >30 minutes before breakfast, separated by >4 hours from acid reflux medications, calcium, iron, multivitamins. Pt. is taking it correctly. At last visit, she was taking Zantac 2h after b'fast >> we moved this >4h later.  Philemon Kingdom, MD PhD [facility] Endocrinology

## 2018-09-08 NOTE — Patient Instructions (Addendum)
Please continue: - Glipizide 10 mg 2x a day before meals - Lantus 80 units at bedtime - Humalog  35 units before breakfast 30 units before lunch 35 units before dinner  Please start Trulicity 9.96 mg weekly x 2 weeks, then increase to 1.5 mg weekly. Take this on Sunday morning, before b'fast.  If this is not affordable, may need to add 20 units of Lantus in am.  Please continue Levothyroxine 112 mcg daily.  Take the thyroid hormone every day, with water, at least 30 minutes before breakfast, separated by at least 4 hours from: - acid reflux medications - calcium - iron - multivitamins  Please return in 3 months with your sugar log.

## 2018-09-09 ENCOUNTER — Telehealth: Payer: Self-pay | Admitting: Internal Medicine

## 2018-09-09 MED ORDER — DULAGLUTIDE 1.5 MG/0.5ML ~~LOC~~ SOAJ: 1.5000 mg | SUBCUTANEOUS | 2 refills | Status: DC

## 2018-09-09 NOTE — Telephone Encounter (Signed)
Nakia from [facility] calling regarding prior auth  Step therapy approved---question regarding amount prescribed  Return a call to [phone] reference patient member number [id number]

## 2018-09-15 ENCOUNTER — Ambulatory Visit: Payer: Medicare Other | Admitting: Cardiology

## 2018-09-20 DIAGNOSIS — S80829A Blister (nonthermal), unspecified lower leg, initial encounter: Secondary | ICD-10-CM | POA: Diagnosis not present

## 2018-09-23 DIAGNOSIS — D1801 Hemangioma of skin and subcutaneous tissue: Secondary | ICD-10-CM | POA: Diagnosis not present

## 2018-09-23 DIAGNOSIS — L821 Other seborrheic keratosis: Secondary | ICD-10-CM | POA: Diagnosis not present

## 2018-09-23 DIAGNOSIS — L570 Actinic keratosis: Secondary | ICD-10-CM | POA: Diagnosis not present

## 2018-09-23 DIAGNOSIS — L859 Epidermal thickening, unspecified: Secondary | ICD-10-CM | POA: Diagnosis not present

## 2018-09-23 DIAGNOSIS — L814 Other melanin hyperpigmentation: Secondary | ICD-10-CM | POA: Diagnosis not present

## 2018-09-26 ENCOUNTER — Inpatient Hospital Stay: Payer: Medicare Other

## 2018-09-26 ENCOUNTER — Inpatient Hospital Stay: Payer: Medicare Other | Admitting: Hematology & Oncology

## 2018-09-28 ENCOUNTER — Inpatient Hospital Stay: Payer: Medicare Other | Attending: Hematology & Oncology

## 2018-09-28 ENCOUNTER — Inpatient Hospital Stay: Payer: Medicare Other

## 2018-09-28 ENCOUNTER — Encounter: Payer: Self-pay | Admitting: Hematology & Oncology

## 2018-09-28 ENCOUNTER — Other Ambulatory Visit: Payer: Self-pay

## 2018-09-28 ENCOUNTER — Inpatient Hospital Stay (HOSPITAL_BASED_OUTPATIENT_CLINIC_OR_DEPARTMENT_OTHER): Payer: Medicare Other | Admitting: Hematology & Oncology

## 2018-09-28 VITALS — BP 127/75 | HR 119 | Temp 98.5°F | Resp 19 | Wt 254.0 lb

## 2018-09-28 DIAGNOSIS — I4891 Unspecified atrial fibrillation: Secondary | ICD-10-CM | POA: Insufficient documentation

## 2018-09-28 DIAGNOSIS — D696 Thrombocytopenia, unspecified: Secondary | ICD-10-CM

## 2018-09-28 DIAGNOSIS — Z79899 Other long term (current) drug therapy: Secondary | ICD-10-CM | POA: Insufficient documentation

## 2018-09-28 DIAGNOSIS — Z86711 Personal history of pulmonary embolism: Secondary | ICD-10-CM | POA: Insufficient documentation

## 2018-09-28 DIAGNOSIS — E1165 Type 2 diabetes mellitus with hyperglycemia: Secondary | ICD-10-CM | POA: Diagnosis not present

## 2018-09-28 DIAGNOSIS — D693 Immune thrombocytopenic purpura: Secondary | ICD-10-CM

## 2018-09-28 DIAGNOSIS — Z794 Long term (current) use of insulin: Secondary | ICD-10-CM | POA: Insufficient documentation

## 2018-09-28 DIAGNOSIS — D509 Iron deficiency anemia, unspecified: Secondary | ICD-10-CM | POA: Diagnosis not present

## 2018-09-28 DIAGNOSIS — D500 Iron deficiency anemia secondary to blood loss (chronic): Secondary | ICD-10-CM

## 2018-09-28 LAB — CBC WITH DIFFERENTIAL (CANCER CENTER ONLY)
Abs Immature Granulocytes: 0.06 10*3/uL (ref 0.00–0.07)
Basophils Absolute: 0 10*3/uL (ref 0.0–0.1)
Basophils Relative: 0 %
Eosinophils Absolute: 0.2 10*3/uL (ref 0.0–0.5)
Eosinophils Relative: 3 %
HCT: 35.1 % — ABNORMAL LOW (ref 36.0–46.0)
Hemoglobin: 11.2 g/dL — ABNORMAL LOW (ref 12.0–15.0)
Immature Granulocytes: 1 %
Lymphocytes Relative: 18 %
Lymphs Abs: 1.4 10*3/uL (ref 0.7–4.0)
MCH: 30.2 pg (ref 26.0–34.0)
MCHC: 31.9 g/dL (ref 30.0–36.0)
MCV: 94.6 fL (ref 80.0–100.0)
Monocytes Absolute: 0.5 10*3/uL (ref 0.1–1.0)
Monocytes Relative: 6 %
Neutro Abs: 5.3 10*3/uL (ref 1.7–7.7)
Neutrophils Relative %: 72 %
Platelet Count: 38 10*3/uL — ABNORMAL LOW (ref 150–400)
RBC: 3.71 MIL/uL — ABNORMAL LOW (ref 3.87–5.11)
RDW: 19 % — ABNORMAL HIGH (ref 11.5–15.5)
WBC Count: 7.4 10*3/uL (ref 4.0–10.5)
nRBC: 0 % (ref 0.0–0.2)

## 2018-09-28 LAB — CMP (CANCER CENTER ONLY)
ALT: 13 U/L (ref 0–44)
AST: 16 U/L (ref 15–41)
Albumin: 4.8 g/dL (ref 3.5–5.0)
Alkaline Phosphatase: 36 U/L — ABNORMAL LOW (ref 38–126)
Anion gap: 10 (ref 5–15)
BUN: 46 mg/dL — ABNORMAL HIGH (ref 8–23)
CO2: 35 mmol/L — ABNORMAL HIGH (ref 22–32)
Calcium: 10.7 mg/dL — ABNORMAL HIGH (ref 8.9–10.3)
Chloride: 98 mmol/L (ref 98–111)
Creatinine: 2.45 mg/dL — ABNORMAL HIGH (ref 0.44–1.00)
GFR, Est AFR Am: 21 mL/min — ABNORMAL LOW (ref >60)
GFR, Estimated: 18 mL/min — ABNORMAL LOW (ref >60)
Glucose, Bld: 235 mg/dL — ABNORMAL HIGH (ref 70–99)
Potassium: 5 mmol/L (ref 3.5–5.1)
Sodium: 143 mmol/L (ref 135–145)
Total Bilirubin: 0.6 mg/dL (ref 0.3–1.2)
Total Protein: 7.5 g/dL (ref 6.5–8.1)

## 2018-09-28 MED ORDER — ROMIPLOSTIM INJECTION 500 MCG
7.0000 ug/kg | Freq: Once | SUBCUTANEOUS | Status: AC
  Administered 2018-09-28: 805 ug via SUBCUTANEOUS
  Filled 2018-09-28: qty 1.11

## 2018-09-28 NOTE — Progress Notes (Signed)
Give same NPlate dose today of 257 mcg per Dr. Marin Olp.

## 2018-09-28 NOTE — Patient Instructions (Signed)
Romiplostim injection What is this medicine? ROMIPLOSTIM (roe mi PLOE stim) helps your body make more platelets. This medicine is used to treat low platelets caused by chronic idiopathic thrombocytopenic purpura (ITP). This medicine may be used for other purposes; ask your health care provider or pharmacist if you have questions. COMMON BRAND NAME(S): Nplate What should I tell my health care provider before I take this medicine? They need to know if you have any of these conditions: -cancer or myelodysplastic syndrome -low blood counts, like low white cell, platelet, or red cell counts -take medicines that treat or prevent blood clots -an unusual or allergic reaction to romiplostim, mannitol, other medicines, foods, dyes, or preservatives -pregnant or trying to get pregnant -breast-feeding How should I use this medicine? This medicine is for injection under the skin. It is given by a health care professional in a hospital or clinic setting. A special MedGuide will be given to you before your injection. Read this information carefully each time. Talk to your pediatrician regarding the use of this medicine in children. Special care may be needed. Overdosage: If you think you have taken too much of this medicine contact a poison control center or emergency room at once. NOTE: This medicine is only for you. Do not share this medicine with others. What if I miss a dose? It is important not to miss your dose. Call your doctor or health care professional if you are unable to keep an appointment. What may interact with this medicine? Interactions are not expected. This list may not describe all possible interactions. Give your health care provider a list of all the medicines, herbs, non-prescription drugs, or dietary supplements you use. Also tell them if you smoke, drink alcohol, or use illegal drugs. Some items may interact with your medicine. What should I watch for while using this  medicine? Your condition will be monitored carefully while you are receiving this medicine. Visit your prescriber or health care professional for regular checks on your progress and for the needed blood tests. It is important to keep all appointments. What side effects may I notice from receiving this medicine? Side effects that you should report to your doctor or health care professional as soon as possible: -allergic reactions like skin rash, itching or hives, swelling of the face, lips, or tongue -shortness of breath, chest pain, swelling in a leg -unusual bleeding or bruising Side effects that usually do not require medical attention (report to your doctor or health care professional if they continue or are bothersome): -dizziness -headache -muscle aches -pain in arms and legs -stomach pain -trouble sleeping This list may not describe all possible side effects. Call your doctor for medical advice about side effects. You may report side effects to FDA at 1-800-FDA-1088. Where should I keep my medicine? This drug is given in a hospital or clinic and will not be stored at home. NOTE: This sheet is a summary. It may not cover all possible information. If you have questions about this medicine, talk to your doctor, pharmacist, or health care provider.  2018 Elsevier/Gold Standard (2008-04-23 15:13:04)  

## 2018-09-28 NOTE — Progress Notes (Signed)
Hematology and Oncology Follow Up Visit  SEBASTIANA WUEST [id number] 10-Aug-1935 84 y.o. 09/28/2018   Principle Diagnosis:  Immune based thrombocytopenia History of PE Iron deficiency anemia Atrial fib  Current Therapy:   Nplate q week for platelet count > 80K IV Iron as indicated - dose given on 07/07/2017   Interim History:  Ms. Steinhaus is here today for follow-up and Nplate.  She had a decent holiday season.  She did not go anywhere.  She actually is in a nursing home right now.  She has had no spontaneous bleeding.  She says that when she has a blood work done recently, there was a lot of bleeding.  She really needs to come in weekly.  I will make sure she comes in weekly so that we can get her platelet count higher.  She still needs to work on her diabetes.  She apparently was just put on Trulicity.  Hopefully, this will help.  Hopefully, she will get her blood sugars down.  If not, I fear that she will run into permanent problems with hyperglycemia.  She has had no fever.  There has been no cough.  There has been no change in bowel or bladder habits.  There is been no problems with leg swelling.  She has had no rashes.  Overall, her performance status is ECOG 2.    Medications:  Allergies as of 09/28/2018      Reactions   Apixaban Other (See Comments)   Internal Bleeding Patient states internal bleeding.   Aspirin Itching, Rash, Hives, Swelling   Swelling of her tongue   Mirabegron Other (See Comments)   Patient experienced A-Fib   Metformin Diarrhea, Nausea Only   Pineapple Swelling   Throat swells and blisters on tongue and roof of mouth per patient   Tetracycline Hives   Fluticasone-salmeterol Itching, Rash   Iodinated Diagnostic Agents Itching, Rash      Lactose Intolerance (gi) Other (See Comments)   gas   Latex Itching, Rash, Other (See Comments)   Pt. States latex pulls her skin off.    Lisinopril Cough   Metronidazole Other (See Comments)   Unknown   Sulfa Antibiotics Rash   Sulfonamide Derivatives Itching, Rash      Medication List       Accurate as of September 28, 2018 11:40 AM. Always use your most recent med list.        acetaminophen 325 MG tablet Commonly known as:  TYLENOL Take 650 mg by mouth every 6 (six) hours as needed.   atorvastatin 40 MG tablet Commonly known as:  LIPITOR Take 40 mg by mouth daily.   BANOPHEN 25 MG tablet Generic drug:  diphenhydrAMINE Take 25 mg by mouth daily.   bisacodyl 5 MG EC tablet Commonly known as:  DULCOLAX Take 5 mg by mouth daily as needed for moderate constipation. As needed every three days   CALCIUM 1200 PO Take 1 tablet by mouth daily.   diltiazem 180 MG 24 hr capsule Commonly known as:  CARDIZEM CD Take 1 capsule (180 mg total) by mouth daily.   docusate sodium 100 MG capsule Commonly known as:  COLACE Take 1 capsule by mouth daily.   Dulaglutide 1.5 MG/0.5ML Sopn Commonly known as:  TRULICITY Inject 1.5 mg into the skin once a week.   fenofibrate 54 MG tablet TAKE 1 TABLET(54 MG) BY MOUTH DAILY   fexofenadine 180 MG tablet Commonly known as:  ALLEGRA Take 180 mg by mouth daily.  Flaxseed Oil 1000 MG Caps Take by mouth 2 (two) times daily.   furosemide 20 MG tablet Commonly known as:  LASIX Take 2 tablets (40 mg total) by mouth daily as needed.   glipiZIDE 10 MG tablet Commonly known as:  GLUCOTROL Take 1 tablet (10 mg total) by mouth 2 (two) times daily before a meal.   HUMALOG [place] Inject 30-35 Units into the skin.   insulin aspart 100 UNIT/ML injection Commonly known as:  novoLOG Inject into the skin.   insulin glargine 100 UNIT/ML injection Commonly known as:  LANTUS Inject 0.8 mLs (80 Units total) into the skin daily.   Insulin Pen Needle 31G X 5 MM Misc Commonly known as:  B-D UF III MINI PEN NEEDLES Use daily for insulin injection   INSULIN SYRINGE 1CC/30GX5/16" 30G X 5/16" 1 ML Misc USE BID UTD   levothyroxine 112 MCG  tablet Commonly known as:  SYNTHROID, LEVOTHROID TAKE 1 TABLET BY MOUTH DAILY   multivitamin tablet Take 1 tablet by mouth daily.   potassium citrate 10 MEQ (1080 MG) SR tablet Commonly known as:  UROCIT-K TK 2 TABLETS PO BID WITH MEALS   PROBIOTIC PO Take 1 capsule by mouth daily.   ranitidine 150 MG tablet Commonly known as:  ZANTAC Take 150 mg by mouth daily as needed for heartburn.   sertraline 100 MG tablet Commonly known as:  ZOLOFT Take 1 tablet by mouth daily.   vitamin C 250 MG tablet Commonly known as:  ASCORBIC ACID Take 250 mg by mouth daily.   Vitamin D3 50 MCG (2000 UT) Tabs Take 2,000 Units by mouth daily.       Allergies:  Allergies  Allergen Reactions  . Apixaban Other (See Comments)    Internal Bleeding  Patient states internal bleeding.  . Aspirin Itching, Rash, Hives and Swelling    Swelling of her tongue  . Mirabegron Other (See Comments)    Patient experienced A-Fib  . Metformin Diarrhea and Nausea Only  . Pineapple Swelling    Throat swells and blisters on tongue and roof of mouth per patient  . Tetracycline Hives  . Fluticasone-Salmeterol Itching and Rash  . Iodinated Diagnostic Agents Itching and Rash       . Lactose Intolerance (Gi) Other (See Comments)    gas  . Latex Itching, Rash and Other (See Comments)    Pt. States latex pulls her skin off.   . Lisinopril Cough  . Metronidazole Other (See Comments)    Unknown   . Sulfa Antibiotics Rash  . Sulfonamide Derivatives Itching and Rash    Past Medical History, Surgical history, Social history, and Family History were reviewed and updated.  Review of Systems: Review of Systems  Constitutional: Positive for malaise/fatigue.  HENT: Negative.   Eyes: Negative.   Respiratory: Negative.   Cardiovascular: Positive for palpitations.  Gastrointestinal: Positive for heartburn and nausea.  Genitourinary: Negative.   Musculoskeletal: Positive for joint pain and myalgias.  Skin:  Negative.   Neurological: Positive for tingling.  Endo/Heme/Allergies: Bruises/bleeds easily.  Psychiatric/Behavioral: Negative.      Physical Exam:  weight is 254 lb (115.2 kg). Her oral temperature is 98.5 F (36.9 C). Her blood pressure is 127/75 and her pulse is 119 (abnormal). Her respiration is 19 and oxygen saturation is 93%.   Wt Readings from Last 3 Encounters:  09/28/18 254 lb (115.2 kg)  09/08/18 256 lb (116.1 kg)  08/15/18 253 lb 8 oz (115 kg)    Physical Exam  Vitals signs reviewed.  HENT:     Head: Normocephalic and atraumatic.  Eyes:     Pupils: Pupils are equal, round, and reactive to light.  Neck:     Musculoskeletal: Normal range of motion.  Cardiovascular:     Rate and Rhythm: Normal rate and regular rhythm.     Heart sounds: Normal heart sounds.     Comments: Cardiac exam shows an irregular rate and rhythm consistent with atrial fibrillation.  The rate is fairly well controlled.  She has no murmurs, rubs or bruits. Pulmonary:     Effort: Pulmonary effort is normal.     Breath sounds: Normal breath sounds.  Abdominal:     General: Bowel sounds are normal.     Palpations: Abdomen is soft.  Musculoskeletal: Normal range of motion.        General: No tenderness or deformity.  Lymphadenopathy:     Cervical: No cervical adenopathy.  Skin:    General: Skin is warm and dry.     Findings: No erythema or rash.  Neurological:     Mental Status: She is alert and oriented to person, place, and time.  Psychiatric:        Behavior: Behavior normal.        Thought Content: Thought content normal.        Judgment: Judgment normal.       Lab Results  Component Value Date   WBC 7.4 09/28/2018   HGB 11.2 (L) 09/28/2018   HCT 35.1 (L) 09/28/2018   MCV 94.6 09/28/2018   PLT 38 (L) 09/28/2018   Lab Results  Component Value Date   FERRITIN 182 08/15/2018   IRON 89 08/15/2018   TIBC 410 08/15/2018   UIBC 321 08/15/2018   IRONPCTSAT 22 08/15/2018   Lab  Results  Component Value Date   RETICCTPCT 1.9 01/28/2011   RBC 3.71 (L) 09/28/2018   RETICCTABS 73.3 01/28/2011   No results found for: KPAFRELGTCHN, LAMBDASER, KAPLAMBRATIO No results found for: IGGSERUM, IGA, IGMSERUM No results found for: Odetta Pink, SPEI   Chemistry      Component Value Date/Time   NA 143 09/28/2018 1041   NA 145 09/08/2017 1317   NA 142 02/25/2017 1013   K 5.0 09/28/2018 1041   K 4.5 09/08/2017 1317   K 3.8 02/25/2017 1013   CL 98 09/28/2018 1041   CL 98 09/08/2017 1317   CO2 35 (H) 09/28/2018 1041   CO2 32 09/08/2017 1317   CO2 26 02/25/2017 1013   BUN 46 (H) 09/28/2018 1041   BUN 34 (H) 09/08/2017 1317   BUN 20.1 02/25/2017 1013   CREATININE 2.45 (H) 09/28/2018 1041   CREATININE 1.9 (H) 09/08/2017 1317   CREATININE 1.8 (H) 02/25/2017 1013      Component Value Date/Time   CALCIUM 10.7 (H) 09/28/2018 1041   CALCIUM 9.3 09/08/2017 1317   CALCIUM 8.7 02/25/2017 1013   ALKPHOS 36 (L) 09/28/2018 1041   ALKPHOS 45 09/08/2017 1317   ALKPHOS 65 02/25/2017 1013   AST 16 09/28/2018 1041   AST 16 02/25/2017 1013   ALT 13 09/28/2018 1041   ALT 26 09/08/2017 1317   ALT 13 02/25/2017 1013   BILITOT 0.6 09/28/2018 1041   BILITOT 0.76 02/25/2017 1013       Impression and Plan: Ms. Flynn is a very pleasant 84 yo caucasian female with immune based thrombocytopenia.   We will give her Nplate today.  Again, we  really have to give her Nplate weekly for right now.  I really would like to see her platelet count above 80,000.  I think we get her platelet count above 80,000, she might be a candidate for anticoagulation therapy for the atrial fibrillation.  I will see her back myself in 4 weeks.     Volanda Napoleon, MD 1/22/202011:40 AM

## 2018-09-29 LAB — IRON AND TIBC
Iron: 84 ug/dL (ref 41–142)
Saturation Ratios: 20 % — ABNORMAL LOW (ref 21–57)
TIBC: 425 ug/dL (ref 236–444)
UIBC: 341 ug/dL (ref 120–384)

## 2018-09-29 LAB — FERRITIN: Ferritin: 89 ng/mL (ref 11–307)

## 2018-09-30 ENCOUNTER — Ambulatory Visit: Payer: Medicare Other | Admitting: Cardiology

## 2018-10-03 ENCOUNTER — Telehealth: Payer: Self-pay | Admitting: Family

## 2018-10-03 NOTE — Telephone Encounter (Signed)
Appointment for Iron infusion has been added to her schedule/ I LMVM for patient with this information per 1/27 sch msg

## 2018-10-04 ENCOUNTER — Other Ambulatory Visit: Payer: Self-pay

## 2018-10-04 ENCOUNTER — Inpatient Hospital Stay: Payer: Medicare Other

## 2018-10-04 VITALS — BP 140/56 | HR 98 | Temp 98.5°F | Resp 20

## 2018-10-04 DIAGNOSIS — E1165 Type 2 diabetes mellitus with hyperglycemia: Secondary | ICD-10-CM | POA: Diagnosis not present

## 2018-10-04 DIAGNOSIS — I4891 Unspecified atrial fibrillation: Secondary | ICD-10-CM | POA: Diagnosis not present

## 2018-10-04 DIAGNOSIS — Z86711 Personal history of pulmonary embolism: Secondary | ICD-10-CM | POA: Diagnosis not present

## 2018-10-04 DIAGNOSIS — D693 Immune thrombocytopenic purpura: Secondary | ICD-10-CM

## 2018-10-04 DIAGNOSIS — D696 Thrombocytopenia, unspecified: Secondary | ICD-10-CM

## 2018-10-04 DIAGNOSIS — Z794 Long term (current) use of insulin: Secondary | ICD-10-CM | POA: Diagnosis not present

## 2018-10-04 DIAGNOSIS — D509 Iron deficiency anemia, unspecified: Secondary | ICD-10-CM | POA: Diagnosis not present

## 2018-10-04 LAB — CMP (CANCER CENTER ONLY)
ALT: 13 U/L (ref 0–44)
AST: 16 U/L (ref 15–41)
Albumin: 4.7 g/dL (ref 3.5–5.0)
Alkaline Phosphatase: 33 U/L — ABNORMAL LOW (ref 38–126)
Anion gap: 11 (ref 5–15)
BUN: 48 mg/dL — ABNORMAL HIGH (ref 8–23)
CO2: 35 mmol/L — ABNORMAL HIGH (ref 22–32)
Calcium: 10.7 mg/dL — ABNORMAL HIGH (ref 8.9–10.3)
Chloride: 95 mmol/L — ABNORMAL LOW (ref 98–111)
Creatinine: 2.27 mg/dL — ABNORMAL HIGH (ref 0.44–1.00)
GFR, Est AFR Am: 23 mL/min — ABNORMAL LOW (ref >60)
GFR, Estimated: 20 mL/min — ABNORMAL LOW (ref >60)
Glucose, Bld: 269 mg/dL — ABNORMAL HIGH (ref 70–99)
Potassium: 4.1 mmol/L (ref 3.5–5.1)
Sodium: 141 mmol/L (ref 135–145)
Total Bilirubin: 0.7 mg/dL (ref 0.3–1.2)
Total Protein: 7.6 g/dL (ref 6.5–8.1)

## 2018-10-04 LAB — CBC WITH DIFFERENTIAL (CANCER CENTER ONLY)
Abs Immature Granulocytes: 0.09 10*3/uL — ABNORMAL HIGH (ref 0.00–0.07)
Basophils Absolute: 0 10*3/uL (ref 0.0–0.1)
Basophils Relative: 0 %
Eosinophils Absolute: 0.2 10*3/uL (ref 0.0–0.5)
Eosinophils Relative: 3 %
HCT: 35.9 % — ABNORMAL LOW (ref 36.0–46.0)
Hemoglobin: 11.6 g/dL — ABNORMAL LOW (ref 12.0–15.0)
Immature Granulocytes: 1 %
Lymphocytes Relative: 19 %
Lymphs Abs: 1.8 10*3/uL (ref 0.7–4.0)
MCH: 30.4 pg (ref 26.0–34.0)
MCHC: 32.3 g/dL (ref 30.0–36.0)
MCV: 94 fL (ref 80.0–100.0)
Monocytes Absolute: 0.6 10*3/uL (ref 0.1–1.0)
Monocytes Relative: 7 %
Neutro Abs: 6.3 10*3/uL (ref 1.7–7.7)
Neutrophils Relative %: 70 %
Platelet Count: 115 10*3/uL — ABNORMAL LOW (ref 150–400)
RBC: 3.82 MIL/uL — ABNORMAL LOW (ref 3.87–5.11)
RDW: 18.9 % — ABNORMAL HIGH (ref 11.5–15.5)
WBC Count: 9.1 10*3/uL (ref 4.0–10.5)
nRBC: 0 % (ref 0.0–0.2)

## 2018-10-04 MED ORDER — SODIUM CHLORIDE 0.9 % IV SOLN
Freq: Once | INTRAVENOUS | Status: AC
  Administered 2018-10-04: 13:00:00 via INTRAVENOUS
  Filled 2018-10-04: qty 250

## 2018-10-04 MED ORDER — SODIUM CHLORIDE 0.9 % IV SOLN
510.0000 mg | Freq: Once | INTRAVENOUS | Status: AC
  Administered 2018-10-04: 510 mg via INTRAVENOUS
  Filled 2018-10-04: qty 17

## 2018-10-04 NOTE — Progress Notes (Signed)
No nplate today per Dr. Marin Olp.

## 2018-10-04 NOTE — Patient Instructions (Signed)
fFerumoxytol injection What is this medicine? FERUMOXYTOL is an iron complex. Iron is used to make healthy red blood cells, which carry oxygen and nutrients throughout the body. This medicine is used to treat iron deficiency anemia. This medicine may be used for other purposes; ask your health care provider or pharmacist if you have questions. COMMON BRAND NAME(S): Feraheme What should I tell my health care provider before I take this medicine? They need to know if you have any of these conditions: -anemia not caused by low iron levels -high levels of iron in the blood -magnetic resonance imaging (MRI) test scheduled -an unusual or allergic reaction to iron, other medicines, foods, dyes, or preservatives -pregnant or trying to get pregnant -breast-feeding How should I use this medicine? This medicine is for injection into a vein. It is given by a health care professional in a hospital or clinic setting. Talk to your pediatrician regarding the use of this medicine in children. Special care may be needed. Overdosage: If you think you have taken too much of this medicine contact a poison control center or emergency room at once. NOTE: This medicine is only for you. Do not share this medicine with others. What if I miss a dose? It is important not to miss your dose. Call your doctor or health care professional if you are unable to keep an appointment. What may interact with this medicine? This medicine may interact with the following medications: -other iron products This list may not describe all possible interactions. Give your health care provider a list of all the medicines, herbs, non-prescription drugs, or dietary supplements you use. Also tell them if you smoke, drink alcohol, or use illegal drugs. Some items may interact with your medicine. What should I watch for while using this medicine? Visit your doctor or healthcare professional regularly. Tell your doctor or healthcare  professional if your symptoms do not start to get better or if they get worse. You may need blood work done while you are taking this medicine. You may need to follow a special diet. Talk to your doctor. Foods that contain iron include: whole grains/cereals, dried fruits, beans, or peas, leafy green vegetables, and organ meats (liver, kidney). What side effects may I notice from receiving this medicine? Side effects that you should report to your doctor or health care professional as soon as possible: -allergic reactions like skin rash, itching or hives, swelling of the face, lips, or tongue -breathing problems -changes in blood pressure -feeling faint or lightheaded, falls -fever or chills -flushing, sweating, or hot feelings -swelling of the ankles or feet Side effects that usually do not require medical attention (report to your doctor or health care professional if they continue or are bothersome): -diarrhea -headache -nausea, vomiting -stomach pain This list may not describe all possible side effects. Call your doctor for medical advice about side effects. You may report side effects to FDA at 1-800-FDA-1088. Where should I keep my medicine? This drug is given in a hospital or clinic and will not be stored at home. NOTE: This sheet is a summary. It may not cover all possible information. If you have questions about this medicine, talk to your doctor, pharmacist, or health care provider.  2019 Elsevier/Gold Standard (2016-10-12 20:21:10)

## 2018-10-05 ENCOUNTER — Ambulatory Visit: Payer: Medicare Other

## 2018-10-05 ENCOUNTER — Other Ambulatory Visit: Payer: Medicare Other

## 2018-10-06 DIAGNOSIS — I4891 Unspecified atrial fibrillation: Secondary | ICD-10-CM | POA: Diagnosis not present

## 2018-10-06 DIAGNOSIS — E1165 Type 2 diabetes mellitus with hyperglycemia: Secondary | ICD-10-CM | POA: Diagnosis not present

## 2018-10-06 DIAGNOSIS — I5033 Acute on chronic diastolic (congestive) heart failure: Secondary | ICD-10-CM | POA: Diagnosis not present

## 2018-10-12 ENCOUNTER — Inpatient Hospital Stay: Payer: Medicare Other | Attending: Hematology & Oncology

## 2018-10-12 ENCOUNTER — Inpatient Hospital Stay: Payer: Medicare Other

## 2018-10-12 VITALS — BP 154/75 | HR 87 | Temp 98.2°F | Resp 18

## 2018-10-12 DIAGNOSIS — Z86711 Personal history of pulmonary embolism: Secondary | ICD-10-CM | POA: Diagnosis not present

## 2018-10-12 DIAGNOSIS — D509 Iron deficiency anemia, unspecified: Secondary | ICD-10-CM | POA: Insufficient documentation

## 2018-10-12 DIAGNOSIS — G629 Polyneuropathy, unspecified: Secondary | ICD-10-CM | POA: Diagnosis not present

## 2018-10-12 DIAGNOSIS — D696 Thrombocytopenia, unspecified: Secondary | ICD-10-CM

## 2018-10-12 DIAGNOSIS — I4891 Unspecified atrial fibrillation: Secondary | ICD-10-CM | POA: Diagnosis not present

## 2018-10-12 DIAGNOSIS — D693 Immune thrombocytopenic purpura: Secondary | ICD-10-CM | POA: Insufficient documentation

## 2018-10-12 LAB — CBC WITH DIFFERENTIAL (CANCER CENTER ONLY)
Abs Immature Granulocytes: 0.05 10*3/uL (ref 0.00–0.07)
Basophils Absolute: 0 10*3/uL (ref 0.0–0.1)
Basophils Relative: 0 %
Eosinophils Absolute: 0.2 10*3/uL (ref 0.0–0.5)
Eosinophils Relative: 3 %
HCT: 34.5 % — ABNORMAL LOW (ref 36.0–46.0)
Hemoglobin: 11.2 g/dL — ABNORMAL LOW (ref 12.0–15.0)
Immature Granulocytes: 1 %
Lymphocytes Relative: 20 %
Lymphs Abs: 1.3 10*3/uL (ref 0.7–4.0)
MCH: 30.9 pg (ref 26.0–34.0)
MCHC: 32.5 g/dL (ref 30.0–36.0)
MCV: 95.3 fL (ref 80.0–100.0)
Monocytes Absolute: 0.4 10*3/uL (ref 0.1–1.0)
Monocytes Relative: 7 %
Neutro Abs: 4.5 10*3/uL (ref 1.7–7.7)
Neutrophils Relative %: 69 %
Platelet Count: 78 10*3/uL — ABNORMAL LOW (ref 150–400)
RBC: 3.62 MIL/uL — ABNORMAL LOW (ref 3.87–5.11)
RDW: 19.5 % — ABNORMAL HIGH (ref 11.5–15.5)
WBC Count: 6.4 10*3/uL (ref 4.0–10.5)
nRBC: 0 % (ref 0.0–0.2)

## 2018-10-12 LAB — CMP (CANCER CENTER ONLY)
ALT: 14 U/L (ref 0–44)
AST: 16 U/L (ref 15–41)
Albumin: 4.6 g/dL (ref 3.5–5.0)
Alkaline Phosphatase: 32 U/L — ABNORMAL LOW (ref 38–126)
Anion gap: 8 (ref 5–15)
BUN: 43 mg/dL — ABNORMAL HIGH (ref 8–23)
CO2: 34 mmol/L — ABNORMAL HIGH (ref 22–32)
Calcium: 10.2 mg/dL (ref 8.9–10.3)
Chloride: 99 mmol/L (ref 98–111)
Creatinine: 2.31 mg/dL — ABNORMAL HIGH (ref 0.44–1.00)
GFR, Est AFR Am: 22 mL/min — ABNORMAL LOW (ref >60)
GFR, Estimated: 19 mL/min — ABNORMAL LOW (ref >60)
Glucose, Bld: 196 mg/dL — ABNORMAL HIGH (ref 70–99)
Potassium: 4.8 mmol/L (ref 3.5–5.1)
Sodium: 141 mmol/L (ref 135–145)
Total Bilirubin: 0.6 mg/dL (ref 0.3–1.2)
Total Protein: 7.3 g/dL (ref 6.5–8.1)

## 2018-10-12 MED ORDER — ROMIPLOSTIM INJECTION 500 MCG
805.0000 ug | Freq: Once | SUBCUTANEOUS | Status: AC
  Administered 2018-10-12: 805 ug via SUBCUTANEOUS
  Filled 2018-10-12: qty 1

## 2018-10-12 NOTE — Patient Instructions (Signed)
Romiplostim injection What is this medicine? ROMIPLOSTIM (roe mi PLOE stim) helps your body make more platelets. This medicine is used to treat low platelets caused by chronic idiopathic thrombocytopenic purpura (ITP). This medicine may be used for other purposes; ask your health care provider or pharmacist if you have questions. COMMON BRAND NAME(S): Nplate What should I tell my health care provider before I take this medicine? They need to know if you have any of these conditions: -bleeding disorders -bone marrow problem, like blood cancer or myelodysplastic syndrome -history of blood clots -liver disease -surgery to remove your spleen -an unusual or allergic reaction to romiplostim, mannitol, other medicines, foods, dyes, or preservatives -pregnant or trying to get pregnant -breast-feeding How should I use this medicine? This medicine is for injection under the skin. It is given by a health care professional in a hospital or clinic setting. A special MedGuide will be given to you before your injection. Read this information carefully each time. Talk to your pediatrician regarding the use of this medicine in children. While this drug may be prescribed for children as young as 1 year for selected conditions, precautions do apply. Overdosage: If you think you have taken too much of this medicine contact a poison control center or emergency room at once. NOTE: This medicine is only for you. Do not share this medicine with others. What if I miss a dose? It is important not to miss your dose. Call your doctor or health care professional if you are unable to keep an appointment. What may interact with this medicine? Interactions are not expected. This list may not describe all possible interactions. Give your health care provider a list of all the medicines, herbs, non-prescription drugs, or dietary supplements you use. Also tell them if you smoke, drink alcohol, or use illegal drugs. Some items  may interact with your medicine. What should I watch for while using this medicine? Your condition will be monitored carefully while you are receiving this medicine. Visit your prescriber or health care professional for regular checks on your progress and for the needed blood tests. It is important to keep all appointments. What side effects may I notice from receiving this medicine? Side effects that you should report to your doctor or health care professional as soon as possible: -allergic reactions like skin rash, itching or hives, swelling of the face, lips, or tongue -signs and symptoms of bleeding such as bloody or black, tarry stools; red or dark brown urine; spitting up blood or brown material that looks like coffee grounds; red spots on the skin; unusual bruising or bleeding from the eyes, gums, or nose -signs and symptoms of a blood clot such as chest pain; shortness of breath; pain, swelling, or warmth in the leg -signs and symptoms of a stroke like changes in vision; confusion; trouble speaking or understanding; severe headaches; sudden numbness or weakness of the face, arm or leg; trouble walking; dizziness; loss of balance or coordination Side effects that usually do not require medical attention (report to your doctor or health care professional if they continue or are bothersome): -headache -pain in arms and legs -pain in mouth -stomach pain This list may not describe all possible side effects. Call your doctor for medical advice about side effects. You may report side effects to FDA at 1-800-FDA-1088. Where should I keep my medicine? This drug is given in a hospital or clinic and will not be stored at home. NOTE: This sheet is a summary. It may not   cover all possible information. If you have questions about this medicine, talk to your doctor, pharmacist, or health care provider.  2019 Elsevier/Gold Standard (2017-08-23 11:10:55)  

## 2018-10-19 ENCOUNTER — Inpatient Hospital Stay: Payer: Medicare Other

## 2018-10-19 VITALS — BP 142/66 | HR 97 | Temp 97.4°F | Resp 20

## 2018-10-19 DIAGNOSIS — D509 Iron deficiency anemia, unspecified: Secondary | ICD-10-CM | POA: Diagnosis not present

## 2018-10-19 DIAGNOSIS — I4891 Unspecified atrial fibrillation: Secondary | ICD-10-CM | POA: Diagnosis not present

## 2018-10-19 DIAGNOSIS — Z86711 Personal history of pulmonary embolism: Secondary | ICD-10-CM | POA: Diagnosis not present

## 2018-10-19 DIAGNOSIS — D693 Immune thrombocytopenic purpura: Secondary | ICD-10-CM | POA: Diagnosis not present

## 2018-10-19 DIAGNOSIS — D696 Thrombocytopenia, unspecified: Secondary | ICD-10-CM

## 2018-10-19 DIAGNOSIS — G629 Polyneuropathy, unspecified: Secondary | ICD-10-CM | POA: Diagnosis not present

## 2018-10-19 LAB — CMP (CANCER CENTER ONLY)
ALT: 15 U/L (ref 0–44)
AST: 19 U/L (ref 15–41)
Albumin: 4.5 g/dL (ref 3.5–5.0)
Alkaline Phosphatase: 29 U/L — ABNORMAL LOW (ref 38–126)
Anion gap: 10 (ref 5–15)
BUN: 36 mg/dL — ABNORMAL HIGH (ref 8–23)
CO2: 33 mmol/L — ABNORMAL HIGH (ref 22–32)
Calcium: 10.1 mg/dL (ref 8.9–10.3)
Chloride: 99 mmol/L (ref 98–111)
Creatinine: 2.26 mg/dL — ABNORMAL HIGH (ref 0.44–1.00)
GFR, Est AFR Am: 23 mL/min — ABNORMAL LOW (ref >60)
GFR, Estimated: 20 mL/min — ABNORMAL LOW (ref >60)
Glucose, Bld: 202 mg/dL — ABNORMAL HIGH (ref 70–99)
Potassium: 4.4 mmol/L (ref 3.5–5.1)
Sodium: 142 mmol/L (ref 135–145)
Total Bilirubin: 0.6 mg/dL (ref 0.3–1.2)
Total Protein: 7.2 g/dL (ref 6.5–8.1)

## 2018-10-19 LAB — CBC WITH DIFFERENTIAL (CANCER CENTER ONLY)
Abs Immature Granulocytes: 0.04 10*3/uL (ref 0.00–0.07)
Basophils Absolute: 0 10*3/uL (ref 0.0–0.1)
Basophils Relative: 0 %
Eosinophils Absolute: 0.2 10*3/uL (ref 0.0–0.5)
Eosinophils Relative: 3 %
HCT: 35.9 % — ABNORMAL LOW (ref 36.0–46.0)
Hemoglobin: 11.6 g/dL — ABNORMAL LOW (ref 12.0–15.0)
Immature Granulocytes: 1 %
Lymphocytes Relative: 18 %
Lymphs Abs: 1 10*3/uL (ref 0.7–4.0)
MCH: 30.7 pg (ref 26.0–34.0)
MCHC: 32.3 g/dL (ref 30.0–36.0)
MCV: 95 fL (ref 80.0–100.0)
Monocytes Absolute: 0.4 10*3/uL (ref 0.1–1.0)
Monocytes Relative: 7 %
Neutro Abs: 3.9 10*3/uL (ref 1.7–7.7)
Neutrophils Relative %: 71 %
Platelet Count: 42 10*3/uL — ABNORMAL LOW (ref 150–400)
RBC: 3.78 MIL/uL — ABNORMAL LOW (ref 3.87–5.11)
RDW: 18.8 % — ABNORMAL HIGH (ref 11.5–15.5)
WBC Count: 5.5 10*3/uL (ref 4.0–10.5)
nRBC: 0 % (ref 0.0–0.2)

## 2018-10-19 MED ORDER — ROMIPLOSTIM INJECTION 500 MCG
805.0000 ug | Freq: Once | SUBCUTANEOUS | Status: AC
  Administered 2018-10-19: 805 ug via SUBCUTANEOUS
  Filled 2018-10-19: qty 1.61

## 2018-10-19 NOTE — Patient Instructions (Signed)
Romiplostim injection What is this medicine? ROMIPLOSTIM (roe mi PLOE stim) helps your body make more platelets. This medicine is used to treat low platelets caused by chronic idiopathic thrombocytopenic purpura (ITP). This medicine may be used for other purposes; ask your health care provider or pharmacist if you have questions. COMMON BRAND NAME(S): Nplate What should I tell my health care provider before I take this medicine? They need to know if you have any of these conditions: -bleeding disorders -bone marrow problem, like blood cancer or myelodysplastic syndrome -history of blood clots -liver disease -surgery to remove your spleen -an unusual or allergic reaction to romiplostim, mannitol, other medicines, foods, dyes, or preservatives -pregnant or trying to get pregnant -breast-feeding How should I use this medicine? This medicine is for injection under the skin. It is given by a health care professional in a hospital or clinic setting. A special MedGuide will be given to you before your injection. Read this information carefully each time. Talk to your pediatrician regarding the use of this medicine in children. While this drug may be prescribed for children as young as 1 year for selected conditions, precautions do apply. Overdosage: If you think you have taken too much of this medicine contact a poison control center or emergency room at once. NOTE: This medicine is only for you. Do not share this medicine with others. What if I miss a dose? It is important not to miss your dose. Call your doctor or health care professional if you are unable to keep an appointment. What may interact with this medicine? Interactions are not expected. This list may not describe all possible interactions. Give your health care provider a list of all the medicines, herbs, non-prescription drugs, or dietary supplements you use. Also tell them if you smoke, drink alcohol, or use illegal drugs. Some items  may interact with your medicine. What should I watch for while using this medicine? Your condition will be monitored carefully while you are receiving this medicine. Visit your prescriber or health care professional for regular checks on your progress and for the needed blood tests. It is important to keep all appointments. What side effects may I notice from receiving this medicine? Side effects that you should report to your doctor or health care professional as soon as possible: -allergic reactions like skin rash, itching or hives, swelling of the face, lips, or tongue -signs and symptoms of bleeding such as bloody or black, tarry stools; red or dark brown urine; spitting up blood or brown material that looks like coffee grounds; red spots on the skin; unusual bruising or bleeding from the eyes, gums, or nose -signs and symptoms of a blood clot such as chest pain; shortness of breath; pain, swelling, or warmth in the leg -signs and symptoms of a stroke like changes in vision; confusion; trouble speaking or understanding; severe headaches; sudden numbness or weakness of the face, arm or leg; trouble walking; dizziness; loss of balance or coordination Side effects that usually do not require medical attention (report to your doctor or health care professional if they continue or are bothersome): -headache -pain in arms and legs -pain in mouth -stomach pain This list may not describe all possible side effects. Call your doctor for medical advice about side effects. You may report side effects to FDA at 1-800-FDA-1088. Where should I keep my medicine? This drug is given in a hospital or clinic and will not be stored at home. NOTE: This sheet is a summary. It may not   cover all possible information. If you have questions about this medicine, talk to your doctor, pharmacist, or health care provider.  2019 Elsevier/Gold Standard (2017-08-23 11:10:55)  

## 2018-10-26 ENCOUNTER — Inpatient Hospital Stay: Payer: Medicare Other

## 2018-10-26 ENCOUNTER — Inpatient Hospital Stay (HOSPITAL_BASED_OUTPATIENT_CLINIC_OR_DEPARTMENT_OTHER): Payer: Medicare Other | Admitting: Family

## 2018-10-26 ENCOUNTER — Encounter: Payer: Self-pay | Admitting: Family

## 2018-10-26 DIAGNOSIS — G629 Polyneuropathy, unspecified: Secondary | ICD-10-CM | POA: Diagnosis not present

## 2018-10-26 DIAGNOSIS — D696 Thrombocytopenia, unspecified: Secondary | ICD-10-CM

## 2018-10-26 DIAGNOSIS — I4891 Unspecified atrial fibrillation: Secondary | ICD-10-CM | POA: Diagnosis not present

## 2018-10-26 DIAGNOSIS — D509 Iron deficiency anemia, unspecified: Secondary | ICD-10-CM | POA: Diagnosis not present

## 2018-10-26 DIAGNOSIS — Z86711 Personal history of pulmonary embolism: Secondary | ICD-10-CM

## 2018-10-26 DIAGNOSIS — D693 Immune thrombocytopenic purpura: Secondary | ICD-10-CM

## 2018-10-26 LAB — CBC WITH DIFFERENTIAL (CANCER CENTER ONLY)
Abs Immature Granulocytes: 0.09 10*3/uL — ABNORMAL HIGH (ref 0.00–0.07)
Basophils Absolute: 0 10*3/uL (ref 0.0–0.1)
Basophils Relative: 0 %
Eosinophils Absolute: 0.2 10*3/uL (ref 0.0–0.5)
Eosinophils Relative: 2 %
HCT: 35.9 % — ABNORMAL LOW (ref 36.0–46.0)
Hemoglobin: 11.8 g/dL — ABNORMAL LOW (ref 12.0–15.0)
Immature Granulocytes: 1 %
Lymphocytes Relative: 17 %
Lymphs Abs: 1.4 10*3/uL (ref 0.7–4.0)
MCH: 30.7 pg (ref 26.0–34.0)
MCHC: 32.9 g/dL (ref 30.0–36.0)
MCV: 93.5 fL (ref 80.0–100.0)
Monocytes Absolute: 0.6 10*3/uL (ref 0.1–1.0)
Monocytes Relative: 7 %
Neutro Abs: 5.8 10*3/uL (ref 1.7–7.7)
Neutrophils Relative %: 73 %
Platelet Count: 114 10*3/uL — ABNORMAL LOW (ref 150–400)
RBC: 3.84 MIL/uL — ABNORMAL LOW (ref 3.87–5.11)
RDW: 19.1 % — ABNORMAL HIGH (ref 11.5–15.5)
WBC Count: 8.1 10*3/uL (ref 4.0–10.5)
nRBC: 0 % (ref 0.0–0.2)

## 2018-10-26 LAB — CMP (CANCER CENTER ONLY)
ALT: 15 U/L (ref 0–44)
AST: 19 U/L (ref 15–41)
Albumin: 4.6 g/dL (ref 3.5–5.0)
Alkaline Phosphatase: 32 U/L — ABNORMAL LOW (ref 38–126)
Anion gap: 10 (ref 5–15)
BUN: 46 mg/dL — ABNORMAL HIGH (ref 8–23)
CO2: 33 mmol/L — ABNORMAL HIGH (ref 22–32)
Calcium: 10.4 mg/dL — ABNORMAL HIGH (ref 8.9–10.3)
Chloride: 97 mmol/L — ABNORMAL LOW (ref 98–111)
Creatinine: 2.51 mg/dL — ABNORMAL HIGH (ref 0.44–1.00)
GFR, Est AFR Am: 20 mL/min — ABNORMAL LOW (ref >60)
GFR, Estimated: 17 mL/min — ABNORMAL LOW (ref >60)
Glucose, Bld: 229 mg/dL — ABNORMAL HIGH (ref 70–99)
Potassium: 4.6 mmol/L (ref 3.5–5.1)
Sodium: 140 mmol/L (ref 135–145)
Total Bilirubin: 0.6 mg/dL (ref 0.3–1.2)
Total Protein: 7.3 g/dL (ref 6.5–8.1)

## 2018-10-26 LAB — RETICULOCYTES
Immature Retic Fract: 23.3 % — ABNORMAL HIGH (ref 2.3–15.9)
RBC.: 3.84 MIL/uL — ABNORMAL LOW (ref 3.87–5.11)
Retic Count, Absolute: 131.7 10*3/uL (ref 19.0–186.0)
Retic Ct Pct: 3.4 % — ABNORMAL HIGH (ref 0.4–3.1)

## 2018-10-26 LAB — PLATELET BY CITRATE

## 2018-10-26 LAB — SAVE SMEAR(SSMR), FOR PROVIDER SLIDE REVIEW

## 2018-10-26 MED ORDER — ROMIPLOSTIM INJECTION 500 MCG
805.0000 ug | Freq: Once | SUBCUTANEOUS | Status: AC
  Administered 2018-10-26: 805 ug via SUBCUTANEOUS
  Filled 2018-10-26: qty 1

## 2018-10-26 NOTE — Progress Notes (Addendum)
Hematology and Oncology Follow Up Visit  [name] [id number] August 20, 1935 84 y.o. 10/26/2018   Principle Diagnosis:  Immune based thrombocytopenia History of PE Iron deficiency anemia Atrial fib  Current Therapy:   Nplate q week for platelet count > 80K IV Iron as indicated - dose given on 07/07/2017   Interim History:  [name] is here today for follow-up. She is doing well but has some occasional fatigue.  No episodes of bleeding. She does bruise easily at times but not in excess.  She has been eating a lot of papaya and platelet count is up to 114.   She has been followed by Dr. Arlyce Dice for atrial fib. She sees him again on 12/01/2018. She is not currently on anticoagulation due to her low platelet counts.  She has some SOB with over exertion. She has history of CHF.  The neuropathy in her toes is unchanged.  She does states that she has problems with balance which she attribute to the cardizem.  No falls or syncopal episodes to report.  She has some puffiness in her feet and ankles. No pitting edema at this time.  No lymphadenopathy noted on exam.  She has maintained a good appetite and is staying well hydrated. Her weight is stable.  He blood sugars are still uncontrolled.   ECOG Performance Status: 1 - Symptomatic but completely ambulatory  Medications:  Allergies as of 10/26/2018      Reactions   Apixaban Other (See Comments)   Internal Bleeding Patient states internal bleeding.   Aspirin Itching, Rash, Hives, Swelling   Swelling of her tongue   Mirabegron Other (See Comments)   Patient experienced A-Fib   Metformin Diarrhea, Nausea Only   Pineapple Swelling   Throat swells and blisters on tongue and roof of mouth per patient   Tetracycline Hives   Fluticasone-salmeterol Itching, Rash   Iodinated Diagnostic Agents Itching, Rash      Lactose Intolerance (gi) Other (See Comments)   gas   Latex Itching, Rash, Other (See Comments)   Pt. States latex pulls  her skin off.    Lisinopril Cough   Metronidazole Other (See Comments)   Unknown   Sulfa Antibiotics Rash   Sulfonamide Derivatives Itching, Rash      Medication List       Accurate as of October 26, 2018  2:39 PM. Always use your most recent med list.        acetaminophen 325 MG tablet Commonly known as:  TYLENOL Take 650 mg by mouth every 6 (six) hours as needed.   atorvastatin 40 MG tablet Commonly known as:  LIPITOR Take 40 mg by mouth daily.   BANOPHEN 25 MG tablet Generic drug:  diphenhydrAMINE Take 25 mg by mouth daily.   bisacodyl 5 MG EC tablet Commonly known as:  DULCOLAX Take 5 mg by mouth daily as needed for moderate constipation. As needed every three days   CALCIUM 1200 PO Take 1 tablet by mouth daily.   diltiazem 180 MG 24 hr capsule Commonly known as:  CARDIZEM CD Take 1 capsule (180 mg total) by mouth daily.   docusate sodium 100 MG capsule Commonly known as:  COLACE Take 1 capsule by mouth daily.   Dulaglutide 1.5 MG/0.5ML Sopn Commonly known as:  TRULICITY Inject 1.5 mg into the skin once a week.   fenofibrate 54 MG tablet TAKE 1 TABLET(54 MG) BY MOUTH DAILY   fexofenadine 180 MG tablet Commonly known as:  ALLEGRA Take 180 mg  by mouth daily.   Flaxseed Oil 1000 MG Caps Take by mouth 2 (two) times daily.   furosemide 20 MG tablet Commonly known as:  LASIX Take 2 tablets (40 mg total) by mouth daily as needed.   glipiZIDE 10 MG tablet Commonly known as:  GLUCOTROL Take 1 tablet (10 mg total) by mouth 2 (two) times daily before a meal.   HUMALOG [place] Inject 30-35 Units into the skin.   insulin aspart 100 UNIT/ML injection Commonly known as:  novoLOG Inject into the skin.   insulin glargine 100 UNIT/ML injection Commonly known as:  LANTUS Inject 0.8 mLs (80 Units total) into the skin daily.   Insulin Pen Needle 31G X 5 MM Misc Commonly known as:  B-D UF III MINI PEN NEEDLES Use daily for insulin injection   INSULIN SYRINGE  1CC/30GX5/16" 30G X 5/16" 1 ML Misc USE BID UTD   levothyroxine 112 MCG tablet Commonly known as:  SYNTHROID, LEVOTHROID TAKE 1 TABLET BY MOUTH DAILY   multivitamin tablet Take 1 tablet by mouth daily.   potassium citrate 10 MEQ (1080 MG) SR tablet Commonly known as:  UROCIT-K TK 2 TABLETS PO BID WITH MEALS   PROBIOTIC PO Take 1 capsule by mouth daily.   ranitidine 150 MG tablet Commonly known as:  ZANTAC Take 150 mg by mouth daily as needed for heartburn.   sertraline 100 MG tablet Commonly known as:  ZOLOFT Take 1 tablet by mouth daily.   vitamin C 250 MG tablet Commonly known as:  ASCORBIC ACID Take 250 mg by mouth daily.   Vitamin D3 50 MCG (2000 UT) Tabs Take 2,000 Units by mouth daily.       Allergies:  Allergies  Allergen Reactions  . Apixaban Other (See Comments)    Internal Bleeding  Patient states internal bleeding.  . Aspirin Itching, Rash, Hives and Swelling    Swelling of her tongue  . Mirabegron Other (See Comments)    Patient experienced A-Fib  . Metformin Diarrhea and Nausea Only  . Pineapple Swelling    Throat swells and blisters on tongue and roof of mouth per patient  . Tetracycline Hives  . Fluticasone-Salmeterol Itching and Rash  . Iodinated Diagnostic Agents Itching and Rash       . Lactose Intolerance (Gi) Other (See Comments)    gas  . Latex Itching, Rash and Other (See Comments)    Pt. States latex pulls her skin off.   . Lisinopril Cough  . Metronidazole Other (See Comments)    Unknown   . Sulfa Antibiotics Rash  . Sulfonamide Derivatives Itching and Rash    Past Medical History, Surgical history, Social history, and Family History were reviewed and updated.  Review of Systems: All other 10 point review of systems is negative.   Physical Exam:  vitals were not taken for this visit.   Wt Readings from Last 3 Encounters:  09/28/18 254 lb (115.2 kg)  09/08/18 256 lb (116.1 kg)  08/15/18 253 lb 8 oz (115 kg)     Ocular: Sclerae unicteric, pupils equal, round and reactive to light Ear-nose-throat: Oropharynx clear, dentition fair Lymphatic: No cervical, supraclavicular or axillary adenopathy Lungs no rales or rhonchi, good excursion bilaterally Heart regular rate and rhythm, no murmur appreciated Abd soft, nontender, positive bowel sounds, no liver or spleen tip palpated on exam, no fluid wave  MSK no focal spinal tenderness, no joint edema Neuro: non-focal, well-oriented, appropriate affect Breasts: Deferred   Lab Results  Component Value  Date   WBC 8.1 10/26/2018   HGB 11.8 (L) 10/26/2018   HCT 35.9 (L) 10/26/2018   MCV 93.5 10/26/2018   PLT 114 (L) 10/26/2018   Lab Results  Component Value Date   FERRITIN 89 09/28/2018   IRON 84 09/28/2018   TIBC 425 09/28/2018   UIBC 341 09/28/2018   IRONPCTSAT 20 (L) 09/28/2018   Lab Results  Component Value Date   RETICCTPCT 3.4 (H) 10/26/2018   RBC 3.84 (L) 10/26/2018   RBC 3.84 (L) 10/26/2018   RETICCTABS 73.3 01/28/2011   No results found for: KPAFRELGTCHN, LAMBDASER, KAPLAMBRATIO No results found for: IGGSERUM, IGA, IGMSERUM No results found for: Odetta Pink, SPEI   Chemistry      Component Value Date/Time   NA 140 10/26/2018 1317   NA 145 09/08/2017 1317   NA 142 02/25/2017 1013   K 4.6 10/26/2018 1317   K 4.5 09/08/2017 1317   K 3.8 02/25/2017 1013   CL 97 (L) 10/26/2018 1317   CL 98 09/08/2017 1317   CO2 33 (H) 10/26/2018 1317   CO2 32 09/08/2017 1317   CO2 26 02/25/2017 1013   BUN 46 (H) 10/26/2018 1317   BUN 34 (H) 09/08/2017 1317   BUN 20.1 02/25/2017 1013   CREATININE 2.51 (H) 10/26/2018 1317   CREATININE 1.9 (H) 09/08/2017 1317   CREATININE 1.8 (H) 02/25/2017 1013      Component Value Date/Time   CALCIUM 10.4 (H) 10/26/2018 1317   CALCIUM 9.3 09/08/2017 1317   CALCIUM 8.7 02/25/2017 1013   ALKPHOS 32 (L) 10/26/2018 1317   ALKPHOS 45 09/08/2017 1317    ALKPHOS 65 02/25/2017 1013   AST 19 10/26/2018 1317   AST 16 02/25/2017 1013   ALT 15 10/26/2018 1317   ALT 26 09/08/2017 1317   ALT 13 02/25/2017 1013   BILITOT 0.6 10/26/2018 1317   BILITOT 0.76 02/25/2017 1013       Impression and Plan: Ms. Owensby is a very pleasant 84 yo caucasian female with immune based thrombocytopenia.  She has started eating papaya daily along with her weekly Nplate.  Her platelet count is up from 42 to 114.  From our standpoint, as long as her platelet count remains above 80 she is ok to restart anticoagulation per Dr. Marin Olp.  We will proceed with her injection today as planned and then see her back in another 3 weeks.  She will continue to eat papaya regularly and contact our office with any questions or concerns. We can certainly see her sooner if need be.   Laverna Peace, NP 2/19/20202:39 PM

## 2018-10-26 NOTE — Patient Instructions (Addendum)
Romiplostim injection What is this medicine? ROMIPLOSTIM (roe mi PLOE stim) helps your body make more platelets. This medicine is used to treat low platelets caused by chronic idiopathic thrombocytopenic purpura (ITP). This medicine may be used for other purposes; ask your health care provider or pharmacist if you have questions. COMMON BRAND NAME(S): Nplate What should I tell my health care provider before I take this medicine? They need to know if you have any of these conditions: -bleeding disorders -bone marrow problem, like blood cancer or myelodysplastic syndrome -history of blood clots -liver disease -surgery to remove your spleen -an unusual or allergic reaction to romiplostim, mannitol, other medicines, foods, dyes, or preservatives -pregnant or trying to get pregnant -breast-feeding How should I use this medicine? This medicine is for injection under the skin. It is given by a health care professional in a hospital or clinic setting. A special MedGuide will be given to you before your injection. Read this information carefully each time. Talk to your pediatrician regarding the use of this medicine in children. While this drug may be prescribed for children as young as 1 year for selected conditions, precautions do apply. Overdosage: If you think you have taken too much of this medicine contact a poison control center or emergency room at once. NOTE: This medicine is only for you. Do not share this medicine with others. What if I miss a dose? It is important not to miss your dose. Call your doctor or health care professional if you are unable to keep an appointment. What may interact with this medicine? Interactions are not expected. This list may not describe all possible interactions. Give your health care provider a list of all the medicines, herbs, non-prescription drugs, or dietary supplements you use. Also tell them if you smoke, drink alcohol, or use illegal drugs. Some items  may interact with your medicine. What should I watch for while using this medicine? Your condition will be monitored carefully while you are receiving this medicine. Visit your prescriber or health care professional for regular checks on your progress and for the needed blood tests. It is important to keep all appointments. What side effects may I notice from receiving this medicine? Side effects that you should report to your doctor or health care professional as soon as possible: -allergic reactions like skin rash, itching or hives, swelling of the face, lips, or tongue -signs and symptoms of bleeding such as bloody or black, tarry stools; red or dark brown urine; spitting up blood or brown material that looks like coffee grounds; red spots on the skin; unusual bruising or bleeding from the eyes, gums, or nose -signs and symptoms of a blood clot such as chest pain; shortness of breath; pain, swelling, or warmth in the leg -signs and symptoms of a stroke like changes in vision; confusion; trouble speaking or understanding; severe headaches; sudden numbness or weakness of the face, arm or leg; trouble walking; dizziness; loss of balance or coordination Side effects that usually do not require medical attention (report to your doctor or health care professional if they continue or are bothersome): -headache -pain in arms and legs -pain in mouth -stomach pain This list may not describe all possible side effects. Call your doctor for medical advice about side effects. You may report side effects to FDA at 1-800-FDA-1088. Where should I keep my medicine? This drug is given in a hospital or clinic and will not be stored at home. NOTE: This sheet is a summary. It may not  cover all possible information. If you have questions about this medicine, talk to your doctor, pharmacist, or health care provider.  2019 Elsevier/Gold Standard (2017-08-23 11:10:55) Romiplostim injection What is this  medicine? ROMIPLOSTIM (roe mi PLOE stim) helps your body make more platelets. This medicine is used to treat low platelets caused by chronic idiopathic thrombocytopenic purpura (ITP). This medicine may be used for other purposes; ask your health care provider or pharmacist if you have questions. COMMON BRAND NAME(S): Nplate What should I tell my health care provider before I take this medicine? They need to know if you have any of these conditions: -bleeding disorders -bone marrow problem, like blood cancer or myelodysplastic syndrome -history of blood clots -liver disease -surgery to remove your spleen -an unusual or allergic reaction to romiplostim, mannitol, other medicines, foods, dyes, or preservatives -pregnant or trying to get pregnant -breast-feeding How should I use this medicine? This medicine is for injection under the skin. It is given by a health care professional in a hospital or clinic setting. A special MedGuide will be given to you before your injection. Read this information carefully each time. Talk to your pediatrician regarding the use of this medicine in children. While this drug may be prescribed for children as young as 1 year for selected conditions, precautions do apply. Overdosage: If you think you have taken too much of this medicine contact a poison control center or emergency room at once. NOTE: This medicine is only for you. Do not share this medicine with others. What if I miss a dose? It is important not to miss your dose. Call your doctor or health care professional if you are unable to keep an appointment. What may interact with this medicine? Interactions are not expected. This list may not describe all possible interactions. Give your health care provider a list of all the medicines, herbs, non-prescription drugs, or dietary supplements you use. Also tell them if you smoke, drink alcohol, or use illegal drugs. Some items may interact with your  medicine. What should I watch for while using this medicine? Your condition will be monitored carefully while you are receiving this medicine. Visit your prescriber or health care professional for regular checks on your progress and for the needed blood tests. It is important to keep all appointments. What side effects may I notice from receiving this medicine? Side effects that you should report to your doctor or health care professional as soon as possible: -allergic reactions like skin rash, itching or hives, swelling of the face, lips, or tongue -signs and symptoms of bleeding such as bloody or black, tarry stools; red or dark brown urine; spitting up blood or brown material that looks like coffee grounds; red spots on the skin; unusual bruising or bleeding from the eyes, gums, or nose -signs and symptoms of a blood clot such as chest pain; shortness of breath; pain, swelling, or warmth in the leg -signs and symptoms of a stroke like changes in vision; confusion; trouble speaking or understanding; severe headaches; sudden numbness or weakness of the face, arm or leg; trouble walking; dizziness; loss of balance or coordination Side effects that usually do not require medical attention (report to your doctor or health care professional if they continue or are bothersome): -headache -pain in arms and legs -pain in mouth -stomach pain This list may not describe all possible side effects. Call your doctor for medical advice about side effects. You may report side effects to FDA at 1-800-FDA-1088. Where should I keep  my medicine? This drug is given in a hospital or clinic and will not be stored at home. NOTE: This sheet is a summary. It may not cover all possible information. If you have questions about this medicine, talk to your doctor, pharmacist, or health care provider.  2019 Elsevier/Gold Standard (2017-08-23 11:10:55)

## 2018-10-27 LAB — IRON AND TIBC
Iron: 82 ug/dL (ref 41–142)
Saturation Ratios: 21 % (ref 21–57)
TIBC: 399 ug/dL (ref 236–444)
UIBC: 317 ug/dL (ref 120–384)

## 2018-10-27 LAB — FERRITIN: Ferritin: 281 ng/mL (ref 11–307)

## 2018-11-14 DIAGNOSIS — I8312 Varicose veins of left lower extremity with inflammation: Secondary | ICD-10-CM | POA: Diagnosis not present

## 2018-11-16 DIAGNOSIS — I2510 Atherosclerotic heart disease of native coronary artery without angina pectoris: Secondary | ICD-10-CM | POA: Diagnosis not present

## 2018-11-16 DIAGNOSIS — E1122 Type 2 diabetes mellitus with diabetic chronic kidney disease: Secondary | ICD-10-CM | POA: Diagnosis not present

## 2018-11-16 DIAGNOSIS — Z86711 Personal history of pulmonary embolism: Secondary | ICD-10-CM | POA: Diagnosis not present

## 2018-11-16 DIAGNOSIS — N184 Chronic kidney disease, stage 4 (severe): Secondary | ICD-10-CM | POA: Diagnosis not present

## 2018-11-16 DIAGNOSIS — I83222 Varicose veins of left lower extremity with both ulcer of calf and inflammation: Secondary | ICD-10-CM | POA: Diagnosis not present

## 2018-11-16 DIAGNOSIS — E039 Hypothyroidism, unspecified: Secondary | ICD-10-CM | POA: Diagnosis not present

## 2018-11-16 DIAGNOSIS — L97221 Non-pressure chronic ulcer of left calf limited to breakdown of skin: Secondary | ICD-10-CM | POA: Diagnosis not present

## 2018-11-16 DIAGNOSIS — I872 Venous insufficiency (chronic) (peripheral): Secondary | ICD-10-CM | POA: Diagnosis not present

## 2018-11-16 DIAGNOSIS — I110 Hypertensive heart disease with heart failure: Secondary | ICD-10-CM | POA: Diagnosis not present

## 2018-11-16 DIAGNOSIS — I4891 Unspecified atrial fibrillation: Secondary | ICD-10-CM | POA: Diagnosis not present

## 2018-11-16 DIAGNOSIS — K219 Gastro-esophageal reflux disease without esophagitis: Secondary | ICD-10-CM | POA: Diagnosis not present

## 2018-11-16 DIAGNOSIS — I130 Hypertensive heart and chronic kidney disease with heart failure and stage 1 through stage 4 chronic kidney disease, or unspecified chronic kidney disease: Secondary | ICD-10-CM | POA: Diagnosis not present

## 2018-11-16 DIAGNOSIS — I739 Peripheral vascular disease, unspecified: Secondary | ICD-10-CM | POA: Diagnosis not present

## 2018-11-17 ENCOUNTER — Inpatient Hospital Stay: Payer: Medicare Other

## 2018-11-17 ENCOUNTER — Inpatient Hospital Stay (HOSPITAL_BASED_OUTPATIENT_CLINIC_OR_DEPARTMENT_OTHER): Payer: Medicare Other | Admitting: Hematology

## 2018-11-17 ENCOUNTER — Other Ambulatory Visit: Payer: Self-pay

## 2018-11-17 ENCOUNTER — Encounter: Payer: Self-pay | Admitting: Hematology

## 2018-11-17 ENCOUNTER — Inpatient Hospital Stay: Payer: Medicare Other | Attending: Hematology & Oncology

## 2018-11-17 ENCOUNTER — Telehealth: Payer: Self-pay | Admitting: Hematology

## 2018-11-17 VITALS — BP 139/67 | HR 89 | Temp 98.0°F | Resp 19 | Ht 61.0 in | Wt 256.0 lb

## 2018-11-17 DIAGNOSIS — D509 Iron deficiency anemia, unspecified: Secondary | ICD-10-CM | POA: Diagnosis not present

## 2018-11-17 DIAGNOSIS — Z79899 Other long term (current) drug therapy: Secondary | ICD-10-CM | POA: Insufficient documentation

## 2018-11-17 DIAGNOSIS — E119 Type 2 diabetes mellitus without complications: Secondary | ICD-10-CM | POA: Insufficient documentation

## 2018-11-17 DIAGNOSIS — D693 Immune thrombocytopenic purpura: Secondary | ICD-10-CM

## 2018-11-17 DIAGNOSIS — D696 Thrombocytopenia, unspecified: Secondary | ICD-10-CM

## 2018-11-17 DIAGNOSIS — D649 Anemia, unspecified: Secondary | ICD-10-CM | POA: Diagnosis not present

## 2018-11-17 DIAGNOSIS — I4891 Unspecified atrial fibrillation: Secondary | ICD-10-CM | POA: Diagnosis not present

## 2018-11-17 DIAGNOSIS — R609 Edema, unspecified: Secondary | ICD-10-CM | POA: Insufficient documentation

## 2018-11-17 DIAGNOSIS — M549 Dorsalgia, unspecified: Secondary | ICD-10-CM

## 2018-11-17 LAB — CBC WITH DIFFERENTIAL (CANCER CENTER ONLY)
Abs Immature Granulocytes: 0.05 10*3/uL (ref 0.00–0.07)
Basophils Absolute: 0 10*3/uL (ref 0.0–0.1)
Basophils Relative: 0 %
Eosinophils Absolute: 0.2 10*3/uL (ref 0.0–0.5)
Eosinophils Relative: 2 %
HCT: 36.3 % (ref 36.0–46.0)
Hemoglobin: 11.8 g/dL — ABNORMAL LOW (ref 12.0–15.0)
Immature Granulocytes: 1 %
Lymphocytes Relative: 17 %
Lymphs Abs: 1.5 10*3/uL (ref 0.7–4.0)
MCH: 31.1 pg (ref 26.0–34.0)
MCHC: 32.5 g/dL (ref 30.0–36.0)
MCV: 95.5 fL (ref 80.0–100.0)
Monocytes Absolute: 0.6 10*3/uL (ref 0.1–1.0)
Monocytes Relative: 7 %
Neutro Abs: 6.4 10*3/uL (ref 1.7–7.7)
Neutrophils Relative %: 73 %
Platelet Count: 39 10*3/uL — ABNORMAL LOW (ref 150–400)
RBC: 3.8 MIL/uL — ABNORMAL LOW (ref 3.87–5.11)
RDW: 18.8 % — ABNORMAL HIGH (ref 11.5–15.5)
WBC Count: 8.6 10*3/uL (ref 4.0–10.5)
nRBC: 0 % (ref 0.0–0.2)

## 2018-11-17 LAB — CMP (CANCER CENTER ONLY)
ALT: 15 U/L (ref 0–44)
AST: 18 U/L (ref 15–41)
Albumin: 4.8 g/dL (ref 3.5–5.0)
Alkaline Phosphatase: 35 U/L — ABNORMAL LOW (ref 38–126)
Anion gap: 11 (ref 5–15)
BUN: 42 mg/dL — ABNORMAL HIGH (ref 8–23)
CO2: 34 mmol/L — ABNORMAL HIGH (ref 22–32)
Calcium: 10.5 mg/dL — ABNORMAL HIGH (ref 8.9–10.3)
Chloride: 97 mmol/L — ABNORMAL LOW (ref 98–111)
Creatinine: 2.3 mg/dL — ABNORMAL HIGH (ref 0.44–1.00)
GFR, Est AFR Am: 22 mL/min — ABNORMAL LOW (ref >60)
GFR, Estimated: 19 mL/min — ABNORMAL LOW (ref >60)
Glucose, Bld: 197 mg/dL — ABNORMAL HIGH (ref 70–99)
Potassium: 4.5 mmol/L (ref 3.5–5.1)
Sodium: 142 mmol/L (ref 135–145)
Total Bilirubin: 0.6 mg/dL (ref 0.3–1.2)
Total Protein: 7.6 g/dL (ref 6.5–8.1)

## 2018-11-17 LAB — PLATELET BY CITRATE

## 2018-11-17 MED ORDER — ROMIPLOSTIM INJECTION 500 MCG
7.0000 ug/kg | Freq: Once | SUBCUTANEOUS | Status: AC
  Administered 2018-11-17: 815 ug via SUBCUTANEOUS
  Filled 2018-11-17: qty 1.63

## 2018-11-17 NOTE — Patient Instructions (Signed)
Romiplostim injection What is this medicine? ROMIPLOSTIM (roe mi PLOE stim) helps your body make more platelets. This medicine is used to treat low platelets caused by chronic idiopathic thrombocytopenic purpura (ITP). This medicine may be used for other purposes; ask your health care provider or pharmacist if you have questions. COMMON BRAND NAME(S): Nplate What should I tell my health care provider before I take this medicine? They need to know if you have any of these conditions: -bleeding disorders -bone marrow problem, like blood cancer or myelodysplastic syndrome -history of blood clots -liver disease -surgery to remove your spleen -an unusual or allergic reaction to romiplostim, mannitol, other medicines, foods, dyes, or preservatives -pregnant or trying to get pregnant -breast-feeding How should I use this medicine? This medicine is for injection under the skin. It is given by a health care professional in a hospital or clinic setting. A special MedGuide will be given to you before your injection. Read this information carefully each time. Talk to your pediatrician regarding the use of this medicine in children. While this drug may be prescribed for children as young as 1 year for selected conditions, precautions do apply. Overdosage: If you think you have taken too much of this medicine contact a poison control center or emergency room at once. NOTE: This medicine is only for you. Do not share this medicine with others. What if I miss a dose? It is important not to miss your dose. Call your doctor or health care professional if you are unable to keep an appointment. What may interact with this medicine? Interactions are not expected. This list may not describe all possible interactions. Give your health care provider a list of all the medicines, herbs, non-prescription drugs, or dietary supplements you use. Also tell them if you smoke, drink alcohol, or use illegal drugs. Some items  may interact with your medicine. What should I watch for while using this medicine? Your condition will be monitored carefully while you are receiving this medicine. Visit your prescriber or health care professional for regular checks on your progress and for the needed blood tests. It is important to keep all appointments. What side effects may I notice from receiving this medicine? Side effects that you should report to your doctor or health care professional as soon as possible: -allergic reactions like skin rash, itching or hives, swelling of the face, lips, or tongue -signs and symptoms of bleeding such as bloody or black, tarry stools; red or dark brown urine; spitting up blood or brown material that looks like coffee grounds; red spots on the skin; unusual bruising or bleeding from the eyes, gums, or nose -signs and symptoms of a blood clot such as chest pain; shortness of breath; pain, swelling, or warmth in the leg -signs and symptoms of a stroke like changes in vision; confusion; trouble speaking or understanding; severe headaches; sudden numbness or weakness of the face, arm or leg; trouble walking; dizziness; loss of balance or coordination Side effects that usually do not require medical attention (report to your doctor or health care professional if they continue or are bothersome): -headache -pain in arms and legs -pain in mouth -stomach pain This list may not describe all possible side effects. Call your doctor for medical advice about side effects. You may report side effects to FDA at 1-800-FDA-1088. Where should I keep my medicine? This drug is given in a hospital or clinic and will not be stored at home. NOTE: This sheet is a summary. It may not   cover all possible information. If you have questions about this medicine, talk to your doctor, pharmacist, or health care provider.  2019 Elsevier/Gold Standard (2017-08-23 11:10:55)  

## 2018-11-17 NOTE — Telephone Encounter (Signed)
Appointments scheduled avs/calendar printed per 3/12 los

## 2018-11-17 NOTE — Progress Notes (Signed)
[place] OFFICE PROGRESS NOTE  Patient Care Team: Javier Glazier, MD as PCP - General (Internal Medicine)  HEME/ONC OVERVIEW: 1. Chronic ITP -06/2017: bone marrow biopsy showed hypercellular marrow for age with erythroid and megakaryocytic hyperplasia; 48,XX[20]  2. History of PTE, not on anticoagulation due to fluctuating platelet count  3. History of iron deficiency anemia   CURRENT THERAPY:   Nplate q week for platelet count > 80Ksince 04/2017  IV Iron as indicated - dose given on 07/07/2017  PERTINENT NON-HEM/ONC PROBLEMS: 1. Atrial fibrillation  ASSESSMENT & PLAN:   Chronic ITP -Patient has long-standing ITP and has been on N-plate since 16/1096 -Plts 39k today, significantly worse from one month ago (114k) -Clinically, patient denies any symptoms of bleeding -Given the relatively precipitous drop in plt count, we will go ahead and administer N-plate  -We will see the patient in 1 week to monitor plt count and determine if she will require further injection -I counseled the patient on any concerning symptoms, including unexplained epistaxis, hemoptysis, hematemesis, hematochezia, melena, or hematuria, for which she should contact the clinic promptly  Normocytic anemia -Likely multifactorial, including anemia of chronic disease due to renal dysfunction -Hgb 11.8 today, stable -Patient denies any symptoms of bleeding -IV iron transfusion last in 06/2017 -We will monitor for now  Back soreness -New; likely secondary to muscle strain -No concerning findings on exam  -I recommended supportive care, including PRN Tylenol and heat packs -If her symptoms worsen, I encouraged her to contact her PCP for further evaluation  No orders of the defined types were placed in this encounter.  All questions were answered. The patient knows to call the clinic with any problems, questions or concerns. No barriers to learning was detected.  Return in 1 week for  labs, clinic appt with NP [facility] and N-plate injection.  Tish Men, MD 11/17/2018 12:16 PM  CHIEF COMPLAINT: "My back is sore "  INTERVAL HISTORY: Ms. Mccaughey returns to clinic for follow-up of chronic ITP.  The patient reports that she was seen in the wound care clinic for wrapping of her lower extremities due to chronic swelling, and was instructed to keep her legs elevated.  She slept in the recliner last night, and awoke up this morning with soreness in the left back, exacerbated by position change, and modestly improved with Tylenol.  She denies any new focal neurologic symptoms.  She had her diabetic monitor placed on her left arm this morning, which caused some bleeding and made her concerned about her platelet count being low again.  She otherwise denies any other symptoms of abnormal bleeding.  SUMMARY OF ONCOLOGIC HISTORY:  No history exists.    REVIEW OF SYSTEMS:   Constitutional: ( - ) fevers, ( - )  chills , ( - ) night sweats Eyes: ( - ) blurriness of vision, ( - ) double vision, ( - ) watery eyes Ears, nose, mouth, throat, and face: ( - ) mucositis, ( - ) sore throat Respiratory: ( - ) cough, ( - ) dyspnea, ( - ) wheezes Cardiovascular: ( - ) palpitation, ( - ) chest discomfort, ( + ) lower extremity swelling Gastrointestinal:  ( - ) nausea, ( - ) heartburn, ( - ) change in bowel habits Skin: ( - ) abnormal skin rashes Lymphatics: ( - ) new lymphadenopathy, ( - ) easy bruising Neurological: ( - ) numbness, ( - ) tingling, ( - ) new weaknesses Behavioral/Psych: ( - ) mood change, ( - )  new changes  All other systems were reviewed with the patient and are negative.  I have reviewed the past medical history, past surgical history, social history and family history with the patient and they are unchanged from previous note.  ALLERGIES:  is allergic to apixaban; aspirin; mirabegron; metformin; pineapple; tetracycline; fluticasone-salmeterol; iodinated diagnostic agents;  lactose intolerance (gi); latex; lisinopril; metronidazole; sulfa antibiotics; and sulfonamide derivatives.  MEDICATIONS:  Current Outpatient Medications  Medication Sig Dispense Refill  . acetaminophen (TYLENOL) 325 MG tablet Take 650 mg by mouth every 6 (six) hours as needed.    Marland Kitchen atorvastatin (LIPITOR) 40 MG tablet Take 40 mg by mouth daily.    . bisacodyl (DULCOLAX) 5 MG EC tablet Take 5 mg by mouth daily as needed for moderate constipation. As needed every three days    . Calcium Carbonate-Vit D-Min (CALCIUM 1200 PO) Take 1 tablet by mouth daily.    . Cholecalciferol (VITAMIN D3) 2000 units TABS Take 2,000 Units by mouth daily.    . diphenhydrAMINE (BANOPHEN) 25 MG tablet Take 25 mg by mouth daily.    Marland Kitchen docusate sodium (COLACE) 100 MG capsule Take 1 capsule by mouth daily.    . Dulaglutide (TRULICITY) 1.5 KD/3.2IZ SOPN Inject 1.5 mg into the skin once a week. 4 pen 2  . fenofibrate 54 MG tablet TAKE 1 TABLET(54 MG) BY MOUTH DAILY 90 tablet 1  . fexofenadine (ALLEGRA) 180 MG tablet Take 180 mg by mouth daily.      . Flaxseed, Linseed, (FLAXSEED OIL) 1000 MG CAPS Take by mouth 2 (two) times daily.    . furosemide (LASIX) 20 MG tablet Take 2 tablets (40 mg total) by mouth daily as needed. (Patient taking differently: Take 40 mg by mouth daily as needed. Three times weekly) 90 tablet 1  . glipiZIDE (GLUCOTROL) 10 MG tablet Take 1 tablet (10 mg total) by mouth 2 (two) times daily before a meal. 180 tablet 1  . insulin aspart (NOVOLOG) 100 UNIT/ML injection Inject into the skin.    Marland Kitchen insulin glargine (LANTUS) 100 UNIT/ML injection Inject 0.8 mLs (80 Units total) into the skin daily. 30 mL 3  . Insulin Lispro (HUMALOG [place]) Inject 30-35 Units into the skin.    . Insulin Pen Needle (B-D UF III MINI PEN NEEDLES) 31G X 5 MM MISC Use daily for insulin injection 100 each 2  . Insulin Syringe-Needle U-100 (INSULIN SYRINGE 1CC/30GX5/16") 30G X 5/16" 1 ML MISC USE BID UTD  11  . levothyroxine (SYNTHROID,  LEVOTHROID) 112 MCG tablet TAKE 1 TABLET BY MOUTH DAILY 90 tablet 0  . Multiple Vitamin (MULTIVITAMIN) tablet Take 1 tablet by mouth daily.      . potassium citrate (UROCIT-K) 10 MEQ (1080 MG) SR tablet TK 2 TABLETS PO BID WITH MEALS  4  . Probiotic Product (PROBIOTIC PO) Take 1 capsule by mouth daily.    . ranitidine (ZANTAC) 150 MG tablet Take 150 mg by mouth daily as needed for heartburn.    . sertraline (ZOLOFT) 100 MG tablet Take 1 tablet by mouth daily.  4  . vitamin C (ASCORBIC ACID) 250 MG tablet Take 250 mg by mouth daily.    Marland Kitchen diltiazem (CARDIZEM CD) 180 MG 24 hr capsule Take 1 capsule (180 mg total) by mouth daily. 90 capsule 3   No current facility-administered medications for this visit.    Facility-Administered Medications Ordered in Other Visits  Medication Dose Route Frequency Provider Last Rate Last Dose  . romiPLOStim (NPLATE) injection 815  mcg  7 mcg/kg Subcutaneous Once Tish Men, MD        PHYSICAL EXAMINATION: ECOG PERFORMANCE STATUS: 2 - Symptomatic, <50% confined to bed  Today's Vitals   11/17/18 1148  BP: 139/67  Pulse: 89  Resp: 19  Temp: 98 F (36.7 C)  TempSrc: Oral  SpO2: 90%  Weight: 256 lb (116.1 kg)  Height: '5\' 1"'$  (1.549 m)  PainSc: 0-No pain   Body mass index is 48.37 kg/m.  Filed Weights   11/17/18 1148  Weight: 256 lb (116.1 kg)    GENERAL: alert, no distress and comfortable, obese SKIN: skin color, texture, turgor are normal, no rashes or significant lesions EYES: conjunctiva are pink and non-injected, sclera clear OROPHARYNX: no exudate, no erythema; lips, buccal mucosa, and tongue normal  NECK: supple, non-tender LYMPH:  no palpable lymphadenopathy in the cervical LUNGS: clear to auscultation with normal breathing effort HEART: regular rate & rhythm and no murmurs and 2+ bilateral lower extremity edema ABDOMEN: soft, non-tender, non-distended, normal bowel sounds Musculoskeletal: no cyanosis of digits and no clubbing  PSYCH: alert  & oriented x 3, fluent speech NEURO: no focal motor/sensory deficits  LABORATORY DATA:  I have reviewed the data as listed    Component Value Date/Time   NA 142 11/17/2018 1111   NA 145 09/08/2017 1317   NA 142 02/25/2017 1013   K 4.5 11/17/2018 1111   K 4.5 09/08/2017 1317   K 3.8 02/25/2017 1013   CL 97 (L) 11/17/2018 1111   CL 98 09/08/2017 1317   CO2 34 (H) 11/17/2018 1111   CO2 32 09/08/2017 1317   CO2 26 02/25/2017 1013   GLUCOSE 197 (H) 11/17/2018 1111   GLUCOSE 251 (H) 09/08/2017 1317   BUN 42 (H) 11/17/2018 1111   BUN 34 (H) 09/08/2017 1317   BUN 20.1 02/25/2017 1013   CREATININE 2.30 (H) 11/17/2018 1111   CREATININE 1.9 (H) 09/08/2017 1317   CREATININE 1.8 (H) 02/25/2017 1013   CALCIUM 10.5 (H) 11/17/2018 1111   CALCIUM 9.3 09/08/2017 1317   CALCIUM 8.7 02/25/2017 1013   PROT 7.6 11/17/2018 1111   PROT 7.9 09/08/2017 1317   PROT 7.6 02/25/2017 1013   ALBUMIN 4.8 11/17/2018 1111   ALBUMIN 3.8 09/08/2017 1317   ALBUMIN 4.0 04/23/2017 1336   ALBUMIN 3.7 02/25/2017 1013   AST 18 11/17/2018 1111   AST 16 02/25/2017 1013   ALT 15 11/17/2018 1111   ALT 26 09/08/2017 1317   ALT 13 02/25/2017 1013   ALKPHOS 35 (L) 11/17/2018 1111   ALKPHOS 45 09/08/2017 1317   ALKPHOS 65 02/25/2017 1013   BILITOT 0.6 11/17/2018 1111   BILITOT 0.76 02/25/2017 1013   GFRNONAA 19 (L) 11/17/2018 1111   GFRAA 22 (L) 11/17/2018 1111    No results found for: SPEP, UPEP  Lab Results  Component Value Date   WBC 8.6 11/17/2018   NEUTROABS 6.4 11/17/2018   HGB 11.8 (L) 11/17/2018   HCT 36.3 11/17/2018   MCV 95.5 11/17/2018   PLT 39 (L) 11/17/2018      Chemistry      Component Value Date/Time   NA 142 11/17/2018 1111   NA 145 09/08/2017 1317   NA 142 02/25/2017 1013   K 4.5 11/17/2018 1111   K 4.5 09/08/2017 1317   K 3.8 02/25/2017 1013   CL 97 (L) 11/17/2018 1111   CL 98 09/08/2017 1317   CO2 34 (H) 11/17/2018 1111   CO2 32 09/08/2017 1317  CO2 26 02/25/2017 1013    BUN 42 (H) 11/17/2018 1111   BUN 34 (H) 09/08/2017 1317   BUN 20.1 02/25/2017 1013   CREATININE 2.30 (H) 11/17/2018 1111   CREATININE 1.9 (H) 09/08/2017 1317   CREATININE 1.8 (H) 02/25/2017 1013      Component Value Date/Time   CALCIUM 10.5 (H) 11/17/2018 1111   CALCIUM 9.3 09/08/2017 1317   CALCIUM 8.7 02/25/2017 1013   ALKPHOS 35 (L) 11/17/2018 1111   ALKPHOS 45 09/08/2017 1317   ALKPHOS 65 02/25/2017 1013   AST 18 11/17/2018 1111   AST 16 02/25/2017 1013   ALT 15 11/17/2018 1111   ALT 26 09/08/2017 1317   ALT 13 02/25/2017 1013   BILITOT 0.6 11/17/2018 1111   BILITOT 0.76 02/25/2017 1013

## 2018-11-21 DIAGNOSIS — R0781 Pleurodynia: Secondary | ICD-10-CM | POA: Diagnosis not present

## 2018-11-23 ENCOUNTER — Telehealth: Payer: Self-pay | Admitting: *Deleted

## 2018-11-23 DIAGNOSIS — I872 Venous insufficiency (chronic) (peripheral): Secondary | ICD-10-CM | POA: Diagnosis not present

## 2018-11-23 NOTE — Telephone Encounter (Signed)
Call received from patient stating that she is on quarantine at [facility] and is allowed out of the facility for medical emergencies only and would like to know what to do about her upcoming appts on 11/25/18.  Per S. [place] NP, [place] for pt to have CBC drawn at facility and faxed to this office.  Call placed back to patient to inform her of orders per S. [place] NP.  Pt.'s facility nurse in pt.'s room at time of call and orders given to [facility].  Order for CBC faxed to [phone].

## 2018-11-24 ENCOUNTER — Ambulatory Visit: Payer: Medicare Other

## 2018-11-24 ENCOUNTER — Other Ambulatory Visit: Payer: Medicare Other

## 2018-11-24 ENCOUNTER — Ambulatory Visit: Payer: Medicare Other | Admitting: Family

## 2018-11-24 ENCOUNTER — Telehealth: Payer: Self-pay | Admitting: Cardiology

## 2018-11-24 ENCOUNTER — Telehealth: Payer: Self-pay | Admitting: Family

## 2018-11-24 NOTE — Telephone Encounter (Signed)
Primary Cardiologist: [facility]  Patient contacted.  History reviewed.  No symptoms to suggest unstable cardiac conditions.  Based on discussion, with current pandemic situation, we will be postponing this appt.  If symptoms change pt is aware to contact the office.  Pt preferred to go ahead and reschedule appt.  Scheduled her to be seen in May with the understanding this is subject to change again depending on where we are with the virus. Pt verbalized understanding.

## 2018-11-24 NOTE — Telephone Encounter (Signed)
Called and pre-screened patient/ NO symptoms only Asthma issues

## 2018-11-25 ENCOUNTER — Inpatient Hospital Stay: Payer: Medicare Other

## 2018-11-25 ENCOUNTER — Inpatient Hospital Stay: Payer: Medicare Other | Admitting: Family

## 2018-11-25 ENCOUNTER — Telehealth: Payer: Self-pay | Admitting: *Deleted

## 2018-11-25 ENCOUNTER — Other Ambulatory Visit: Payer: Self-pay

## 2018-11-25 ENCOUNTER — Other Ambulatory Visit: Payer: Self-pay | Admitting: *Deleted

## 2018-11-25 VITALS — BP 130/82 | HR 85 | Temp 98.2°F | Resp 18

## 2018-11-25 DIAGNOSIS — D693 Immune thrombocytopenic purpura: Secondary | ICD-10-CM

## 2018-11-25 DIAGNOSIS — R609 Edema, unspecified: Secondary | ICD-10-CM | POA: Diagnosis not present

## 2018-11-25 DIAGNOSIS — D649 Anemia, unspecified: Secondary | ICD-10-CM | POA: Diagnosis not present

## 2018-11-25 DIAGNOSIS — Z79899 Other long term (current) drug therapy: Secondary | ICD-10-CM | POA: Diagnosis not present

## 2018-11-25 DIAGNOSIS — D638 Anemia in other chronic diseases classified elsewhere: Secondary | ICD-10-CM | POA: Diagnosis not present

## 2018-11-25 DIAGNOSIS — D696 Thrombocytopenia, unspecified: Secondary | ICD-10-CM

## 2018-11-25 DIAGNOSIS — E119 Type 2 diabetes mellitus without complications: Secondary | ICD-10-CM | POA: Diagnosis not present

## 2018-11-25 MED ORDER — SODIUM CHLORIDE 0.9 % IV SOLN
510.0000 mg | Freq: Once | INTRAVENOUS | Status: DC
  Filled 2018-11-25: qty 17

## 2018-11-25 MED ORDER — ROMIPLOSTIM INJECTION 500 MCG
7.0000 ug/kg | Freq: Once | SUBCUTANEOUS | Status: AC
  Administered 2018-11-25: 815 ug via SUBCUTANEOUS
  Filled 2018-11-25: qty 1

## 2018-11-25 NOTE — Patient Instructions (Signed)
Romiplostim injection What is this medicine? ROMIPLOSTIM (roe mi PLOE stim) helps your body make more platelets. This medicine is used to treat low platelets caused by chronic idiopathic thrombocytopenic purpura (ITP). This medicine may be used for other purposes; ask your health care provider or pharmacist if you have questions. COMMON BRAND NAME(S): Nplate What should I tell my health care provider before I take this medicine? They need to know if you have any of these conditions: -bleeding disorders -bone marrow problem, like blood cancer or myelodysplastic syndrome -history of blood clots -liver disease -surgery to remove your spleen -an unusual or allergic reaction to romiplostim, mannitol, other medicines, foods, dyes, or preservatives -pregnant or trying to get pregnant -breast-feeding How should I use this medicine? This medicine is for injection under the skin. It is given by a health care professional in a hospital or clinic setting. A special MedGuide will be given to you before your injection. Read this information carefully each time. Talk to your pediatrician regarding the use of this medicine in children. While this drug may be prescribed for children as young as 1 year for selected conditions, precautions do apply. Overdosage: If you think you have taken too much of this medicine contact a poison control center or emergency room at once. NOTE: This medicine is only for you. Do not share this medicine with others. What if I miss a dose? It is important not to miss your dose. Call your doctor or health care professional if you are unable to keep an appointment. What may interact with this medicine? Interactions are not expected. This list may not describe all possible interactions. Give your health care provider a list of all the medicines, herbs, non-prescription drugs, or dietary supplements you use. Also tell them if you smoke, drink alcohol, or use illegal drugs. Some items  may interact with your medicine. What should I watch for while using this medicine? Your condition will be monitored carefully while you are receiving this medicine. Visit your prescriber or health care professional for regular checks on your progress and for the needed blood tests. It is important to keep all appointments. What side effects may I notice from receiving this medicine? Side effects that you should report to your doctor or health care professional as soon as possible: -allergic reactions like skin rash, itching or hives, swelling of the face, lips, or tongue -signs and symptoms of bleeding such as bloody or black, tarry stools; red or dark brown urine; spitting up blood or brown material that looks like coffee grounds; red spots on the skin; unusual bruising or bleeding from the eyes, gums, or nose -signs and symptoms of a blood clot such as chest pain; shortness of breath; pain, swelling, or warmth in the leg -signs and symptoms of a stroke like changes in vision; confusion; trouble speaking or understanding; severe headaches; sudden numbness or weakness of the face, arm or leg; trouble walking; dizziness; loss of balance or coordination Side effects that usually do not require medical attention (report to your doctor or health care professional if they continue or are bothersome): -headache -pain in arms and legs -pain in mouth -stomach pain This list may not describe all possible side effects. Call your doctor for medical advice about side effects. You may report side effects to FDA at 1-800-FDA-1088. Where should I keep my medicine? This drug is given in a hospital or clinic and will not be stored at home. NOTE: This sheet is a summary. It may not   cover all possible information. If you have questions about this medicine, talk to your doctor, pharmacist, or health care provider.  2019 Elsevier/Gold Standard (2017-08-23 11:10:55)  

## 2018-11-25 NOTE — Telephone Encounter (Signed)
Message left for pt to call office back to make appt to come in today for nplate d/t platelet count of 48.

## 2018-11-25 NOTE — Progress Notes (Signed)
Patient has labs from the lab at [facility]. Pltc = 48

## 2018-11-25 NOTE — Telephone Encounter (Signed)
Scheduler spoke with patient to confirm appt times for today.

## 2018-11-28 ENCOUNTER — Telehealth: Payer: Self-pay | Admitting: Family

## 2018-11-28 NOTE — Telephone Encounter (Signed)
sw pt to confirm inj appt 3/27 and lab/follow up/inj 4/2 at 1 pm per 3/22 sch msg

## 2018-11-29 DIAGNOSIS — R609 Edema, unspecified: Secondary | ICD-10-CM | POA: Diagnosis not present

## 2018-11-29 DIAGNOSIS — I878 Other specified disorders of veins: Secondary | ICD-10-CM | POA: Diagnosis not present

## 2018-11-30 DIAGNOSIS — E1122 Type 2 diabetes mellitus with diabetic chronic kidney disease: Secondary | ICD-10-CM | POA: Diagnosis not present

## 2018-12-01 ENCOUNTER — Ambulatory Visit: Payer: Medicare Other | Admitting: Cardiology

## 2018-12-02 ENCOUNTER — Inpatient Hospital Stay: Payer: Medicare Other

## 2018-12-02 ENCOUNTER — Other Ambulatory Visit: Payer: Self-pay | Admitting: Family

## 2018-12-02 ENCOUNTER — Other Ambulatory Visit: Payer: Self-pay

## 2018-12-02 ENCOUNTER — Other Ambulatory Visit: Payer: Self-pay | Admitting: *Deleted

## 2018-12-02 DIAGNOSIS — D693 Immune thrombocytopenic purpura: Secondary | ICD-10-CM

## 2018-12-02 DIAGNOSIS — E119 Type 2 diabetes mellitus without complications: Secondary | ICD-10-CM | POA: Diagnosis not present

## 2018-12-02 DIAGNOSIS — R609 Edema, unspecified: Secondary | ICD-10-CM | POA: Diagnosis not present

## 2018-12-02 DIAGNOSIS — Z79899 Other long term (current) drug therapy: Secondary | ICD-10-CM | POA: Diagnosis not present

## 2018-12-02 DIAGNOSIS — D649 Anemia, unspecified: Secondary | ICD-10-CM | POA: Diagnosis not present

## 2018-12-02 MED ORDER — ROMIPLOSTIM INJECTION 500 MCG
815.0000 ug | Freq: Once | SUBCUTANEOUS | Status: AC
  Administered 2018-12-02: 815 ug via SUBCUTANEOUS
  Filled 2018-12-02: qty 0.63

## 2018-12-02 NOTE — Patient Instructions (Signed)
Romiplostim injection What is this medicine? ROMIPLOSTIM (roe mi PLOE stim) helps your body make more platelets. This medicine is used to treat low platelets caused by chronic idiopathic thrombocytopenic purpura (ITP). This medicine may be used for other purposes; ask your health care provider or pharmacist if you have questions. COMMON BRAND NAME(S): Nplate What should I tell my health care provider before I take this medicine? They need to know if you have any of these conditions: -bleeding disorders -bone marrow problem, like blood cancer or myelodysplastic syndrome -history of blood clots -liver disease -surgery to remove your spleen -an unusual or allergic reaction to romiplostim, mannitol, other medicines, foods, dyes, or preservatives -pregnant or trying to get pregnant -breast-feeding How should I use this medicine? This medicine is for injection under the skin. It is given by a health care professional in a hospital or clinic setting. A special MedGuide will be given to you before your injection. Read this information carefully each time. Talk to your pediatrician regarding the use of this medicine in children. While this drug may be prescribed for children as young as 1 year for selected conditions, precautions do apply. Overdosage: If you think you have taken too much of this medicine contact a poison control center or emergency room at once. NOTE: This medicine is only for you. Do not share this medicine with others. What if I miss a dose? It is important not to miss your dose. Call your doctor or health care professional if you are unable to keep an appointment. What may interact with this medicine? Interactions are not expected. This list may not describe all possible interactions. Give your health care provider a list of all the medicines, herbs, non-prescription drugs, or dietary supplements you use. Also tell them if you smoke, drink alcohol, or use illegal drugs. Some items  may interact with your medicine. What should I watch for while using this medicine? Your condition will be monitored carefully while you are receiving this medicine. Visit your prescriber or health care professional for regular checks on your progress and for the needed blood tests. It is important to keep all appointments. What side effects may I notice from receiving this medicine? Side effects that you should report to your doctor or health care professional as soon as possible: -allergic reactions like skin rash, itching or hives, swelling of the face, lips, or tongue -signs and symptoms of bleeding such as bloody or black, tarry stools; red or dark brown urine; spitting up blood or brown material that looks like coffee grounds; red spots on the skin; unusual bruising or bleeding from the eyes, gums, or nose -signs and symptoms of a blood clot such as chest pain; shortness of breath; pain, swelling, or warmth in the leg -signs and symptoms of a stroke like changes in vision; confusion; trouble speaking or understanding; severe headaches; sudden numbness or weakness of the face, arm or leg; trouble walking; dizziness; loss of balance or coordination Side effects that usually do not require medical attention (report to your doctor or health care professional if they continue or are bothersome): -headache -pain in arms and legs -pain in mouth -stomach pain This list may not describe all possible side effects. Call your doctor for medical advice about side effects. You may report side effects to FDA at 1-800-FDA-1088. Where should I keep my medicine? This drug is given in a hospital or clinic and will not be stored at home. NOTE: This sheet is a summary. It may not   cover all possible information. If you have questions about this medicine, talk to your doctor, pharmacist, or health care provider.  2019 Elsevier/Gold Standard (2017-08-23 11:10:55)  

## 2018-12-05 DIAGNOSIS — I872 Venous insufficiency (chronic) (peripheral): Secondary | ICD-10-CM | POA: Diagnosis not present

## 2018-12-08 ENCOUNTER — Inpatient Hospital Stay: Payer: Medicare Other | Admitting: Family

## 2018-12-08 ENCOUNTER — Inpatient Hospital Stay: Payer: Medicare Other | Attending: Hematology & Oncology

## 2018-12-08 ENCOUNTER — Inpatient Hospital Stay: Payer: Medicare Other

## 2018-12-08 ENCOUNTER — Other Ambulatory Visit: Payer: Self-pay

## 2018-12-08 DIAGNOSIS — I4891 Unspecified atrial fibrillation: Secondary | ICD-10-CM | POA: Diagnosis not present

## 2018-12-08 DIAGNOSIS — D693 Immune thrombocytopenic purpura: Secondary | ICD-10-CM | POA: Diagnosis not present

## 2018-12-08 DIAGNOSIS — D696 Thrombocytopenia, unspecified: Secondary | ICD-10-CM

## 2018-12-08 DIAGNOSIS — Z79899 Other long term (current) drug therapy: Secondary | ICD-10-CM | POA: Insufficient documentation

## 2018-12-08 DIAGNOSIS — Z86711 Personal history of pulmonary embolism: Secondary | ICD-10-CM | POA: Insufficient documentation

## 2018-12-08 DIAGNOSIS — D509 Iron deficiency anemia, unspecified: Secondary | ICD-10-CM | POA: Insufficient documentation

## 2018-12-08 LAB — CBC WITH DIFFERENTIAL (CANCER CENTER ONLY)
Abs Immature Granulocytes: 0.08 10*3/uL — ABNORMAL HIGH (ref 0.00–0.07)
Basophils Absolute: 0 10*3/uL (ref 0.0–0.1)
Basophils Relative: 0 %
Eosinophils Absolute: 0.2 10*3/uL (ref 0.0–0.5)
Eosinophils Relative: 2 %
HCT: 35.2 % — ABNORMAL LOW (ref 36.0–46.0)
Hemoglobin: 11.6 g/dL — ABNORMAL LOW (ref 12.0–15.0)
Immature Granulocytes: 1 %
Lymphocytes Relative: 16 %
Lymphs Abs: 1.2 10*3/uL (ref 0.7–4.0)
MCH: 31.5 pg (ref 26.0–34.0)
MCHC: 33 g/dL (ref 30.0–36.0)
MCV: 95.7 fL (ref 80.0–100.0)
Monocytes Absolute: 0.6 10*3/uL (ref 0.1–1.0)
Monocytes Relative: 9 %
Neutro Abs: 5.3 10*3/uL (ref 1.7–7.7)
Neutrophils Relative %: 72 %
Platelet Count: 104 10*3/uL — ABNORMAL LOW (ref 150–400)
RBC: 3.68 MIL/uL — ABNORMAL LOW (ref 3.87–5.11)
RDW: 18.4 % — ABNORMAL HIGH (ref 11.5–15.5)
WBC Count: 7.5 10*3/uL (ref 4.0–10.5)
nRBC: 0 % (ref 0.0–0.2)

## 2018-12-08 LAB — CMP (CANCER CENTER ONLY)
ALT: 15 U/L (ref 0–44)
AST: 19 U/L (ref 15–41)
Albumin: 4.6 g/dL (ref 3.5–5.0)
Alkaline Phosphatase: 32 U/L — ABNORMAL LOW (ref 38–126)
Anion gap: 13 (ref 5–15)
BUN: 45 mg/dL — ABNORMAL HIGH (ref 8–23)
CO2: 32 mmol/L (ref 22–32)
Calcium: 10 mg/dL (ref 8.9–10.3)
Chloride: 96 mmol/L — ABNORMAL LOW (ref 98–111)
Creatinine: 2.71 mg/dL — ABNORMAL HIGH (ref 0.44–1.00)
GFR, Est AFR Am: 18 mL/min — ABNORMAL LOW (ref >60)
GFR, Estimated: 16 mL/min — ABNORMAL LOW (ref >60)
Glucose, Bld: 215 mg/dL — ABNORMAL HIGH (ref 70–99)
Potassium: 4.5 mmol/L (ref 3.5–5.1)
Sodium: 141 mmol/L (ref 135–145)
Total Bilirubin: 0.7 mg/dL (ref 0.3–1.2)
Total Protein: 7.6 g/dL (ref 6.5–8.1)

## 2018-12-08 NOTE — Progress Notes (Signed)
Per Dr. Marin Olp, no N-Plate is needed today. Platelet count is 104. Patient verbalized understanding.

## 2018-12-09 ENCOUNTER — Telehealth: Payer: Self-pay

## 2018-12-09 ENCOUNTER — Ambulatory Visit: Payer: Medicare Other | Admitting: Internal Medicine

## 2018-12-09 DIAGNOSIS — I130 Hypertensive heart and chronic kidney disease with heart failure and stage 1 through stage 4 chronic kidney disease, or unspecified chronic kidney disease: Secondary | ICD-10-CM | POA: Diagnosis not present

## 2018-12-09 DIAGNOSIS — I872 Venous insufficiency (chronic) (peripheral): Secondary | ICD-10-CM | POA: Diagnosis not present

## 2018-12-09 DIAGNOSIS — E1122 Type 2 diabetes mellitus with diabetic chronic kidney disease: Secondary | ICD-10-CM | POA: Diagnosis not present

## 2018-12-09 NOTE — Telephone Encounter (Signed)
Received VM from [place] with [facility] [employer] reporting pt's NPlate has been approved from 12/06/2018 - 12/06/2019. dph

## 2018-12-12 ENCOUNTER — Telehealth: Payer: Self-pay | Admitting: Internal Medicine

## 2018-12-12 ENCOUNTER — Encounter: Payer: Self-pay | Admitting: Internal Medicine

## 2018-12-12 ENCOUNTER — Ambulatory Visit (INDEPENDENT_AMBULATORY_CARE_PROVIDER_SITE_OTHER): Payer: Medicare Other | Admitting: Internal Medicine

## 2018-12-12 DIAGNOSIS — E1122 Type 2 diabetes mellitus with diabetic chronic kidney disease: Secondary | ICD-10-CM

## 2018-12-12 DIAGNOSIS — N184 Chronic kidney disease, stage 4 (severe): Secondary | ICD-10-CM

## 2018-12-12 DIAGNOSIS — E890 Postprocedural hypothyroidism: Secondary | ICD-10-CM | POA: Diagnosis not present

## 2018-12-12 DIAGNOSIS — E7800 Pure hypercholesterolemia, unspecified: Secondary | ICD-10-CM | POA: Diagnosis not present

## 2018-12-12 DIAGNOSIS — Z794 Long term (current) use of insulin: Secondary | ICD-10-CM | POA: Diagnosis not present

## 2018-12-12 NOTE — Telephone Encounter (Signed)
[facility] is requesting the new medication orders to be faxed to them from her visit today   [place]- [phone]

## 2018-12-12 NOTE — Patient Instructions (Addendum)
Please continue: - Glipizide 10 mg in am, but stop the afternoon dose - Trulicity 1.5 mg weekly - Lantus 80 units at bedtime   Please decrease: - Humalog  35 units before breakfast 30 units before lunch 25 units before dinner   Please continue Levothyroxine 112 mcg daily.  Take the thyroid hormone every day, with water, at least 30 minutes before breakfast, separated by at least 4 hours from: - acid reflux medications - calcium - iron - multivitamins  Please return in 3 months with your sugar log.

## 2018-12-12 NOTE — Progress Notes (Signed)
Patient ID: [name], female   DOB: Jan 13, 1935, 84 y.o.   MRN: [id number]   Patient location: Home My location: Office  Referring Provider: Javier Glazier, MD  I connected with the patient on 12/12/18 at  1:25 PM EDT by a video enabled telemedicine application and verified that I am speaking with the correct person.   I discussed the limitations of evaluation and management by telemedicine and the availability of in person appointments. The patient expressed understanding and agreed to proceed.   Details of the encounter are shown below.  HPI: [name] is a 84 y.o.-year-old female, returning for f/u for DM2, dx in late 1990s, insulin-dependent since 2012, uncontrolled, with complications (CKD stage 4, CHF, + DR) and hypothyroidism. She prev. Saw Dr. Chalmers Cater. Last visit with me 3 mo ago.  She still resides in in [facility] facility.  She has a skin ulcer - sees wound care.  DM2:  Last hemoglobin A1c was: 12/11/2018: HbA1c 6.9% Lab Results  Component Value Date   HGBA1C 7.3 (A) 09/08/2018   HGBA1C 7.4 (A) 05/19/2018   HGBA1C 8.3 (A) 03/22/2018  08/19/2017: HbA1c calculated from fructosamine is 6.7%  She is on: - Glipizide 10 mg 2x a day before meals - Trulicity 1.5 mg weekly - started 09/2018 - Lantus 80 units at bedtime - Humalog: 35 units before breakfast 30 units before lunch 35 units before dinner  Pt checks her sugars 4x a day: - am: 99, 262-343 >> 192-264 >> 158-252, 268 >> 150-190, 203 - 2h after b'fast: n/c >> 290 >> n/c - before lunch:187-232, 286 >> 157-233, 279, 298 >> <150 - 2h after lunch: n/c >> 164, 228 >> n/c - before dinner: 144-273 >> 149-238, 260 >> 112-125 - 2h after dinner: n/c >> 261 >> n/c - bedtime: 182-296 >> 144-244, 277, 310 >> 77-120 - nighttime: n/c Lowest sugar was 295 >> 99 x1 >> 144 >> 143 >> 77; she has hypoglycemia awareness at 100. Highest sugar was 559 >> 431 >> 296 >> 310 >> 203.  Glucometer: True Metrix air >>  Freestyle Libre CGM  Pt's meals are: - Breakfast: grits, bacon, green tea + stevia >> eggs + bacon + toast/grits + fruit - Lunch: 1/2 sandwich + salad + chips and drink >> 1/2 sandwich - snack: fruit - Dinner:meat + veggies + occas. Starch >> meat + veggies - Snacks: 2 - usually salty  -+ stage 4 CKD, last BUN/creatinine:  Lab Results  Component Value Date   BUN 45 (H) 12/08/2018   BUN 42 (H) 11/17/2018   CREATININE 2.71 (H) 12/08/2018   CREATININE 2.30 (H) 11/17/2018  03/15/2018: 47/2.34, GFR 19, Glu 283  + low GFR: Lab Results  Component Value Date   GFRNONAA 16 (L) 12/08/2018   GFRNONAA 19 (L) 11/17/2018   GFRNONAA 17 (L) 10/26/2018   GFRNONAA 20 (L) 10/19/2018   GFRNONAA 19 (L) 10/12/2018   GFRNONAA 20 (L) 10/04/2018   GFRNONAA 18 (L) 09/28/2018   GFRNONAA 20 (L) 08/26/2018   GFRNONAA 20 (L) 08/15/2018   GFRNONAA 18 (L) 07/22/2018  He has a h/o uric acid kidney stones.  - +HL; last set of lipids: Lab Results  Component Value Date   CHOL 131 05/19/2018   HDL 26.70 (L) 05/19/2018   LDLCALC 65 08/16/2009   LDLDIRECT 41.0 05/19/2018   TRIG (H) 05/19/2018    656.0 Triglyceride is over 400; calculations on Lipids are invalid.   CHOLHDL 5 05/19/2018  On Lipitor  40, Fenofibrate 45, flax seed oil 1000 mg bid. - last eye exam was in 05/2018: + DR - improved -+  Numbness and tingling in her toes  She has a h/o urinary incontinence >> started Myrbetriq >> developed A fib >> started Eloquis >> had bleeding >> received blood transfusion.  Postsurgical (1960s) and postablative (1977 and 1999) hypothyroidism   Pt is on levothyroxine 112 mcg daily, taken: - in am - fasting - at least 30 min from b'fast - no Ca, Fe, MVI, PPIs - Zantac later in the day - not on Biotin  Last TSH was normal: Lab Results  Component Value Date   TSH 0.92 05/19/2018   She had a mild MI at the beginning of 2019 and also CHF.   She has immune Tc-penia and anemia.  ROS: Constitutional:  no weight gain/no weight loss, no fatigue, no subjective hyperthermia, no subjective hypothermia Eyes: no blurry vision, no xerophthalmia ENT: no sore throat, no nodules palpated in neck, no dysphagia, no odynophagia, no hoarseness Cardiovascular: no CP/no SOB/no palpitations/no leg swelling Respiratory: no cough/no SOB/no wheezing Gastrointestinal: no N/no V/no D/no C/no acid reflux Musculoskeletal: no muscle aches/no joint aches Skin: no rashes, no hair loss Neurological: no tremors/+ numbness/+ tingling/no dizziness  I reviewed pt's medications, allergies, PMH, social hx, family hx, and changes were documented in the history of present illness. Otherwise, unchanged from my initial visit note.  Past Medical History:  Diagnosis Date  . Allergic rhinitis   . Anxiety state, unspecified    panic attacks  . CHF (congestive heart failure) ([place])   . Depressive disorder, not elsewhere classified   . Extrinsic asthma, unspecified    no problem since adulthood  . Obesity   . OSA on CPAP    severe  . Pure hypercholesterolemia   . Respiratory failure with hypoxia ([place]) 09/2008   acute, secondary to multiple bilateral pulmonary embolism , negative hypercoagulable workup 09/2008 hospital stay  . Scoliosis   . Syncope and collapse   . Type II or unspecified type diabetes mellitus without mention of complication, not stated as uncontrolled   . Unspecified essential hypertension   . Unspecified hypothyroidism    hypo   Past Surgical History:  Procedure Laterality Date  . EYE SURGERY    . JOINT REPLACEMENT    . knee replaced    . THYROID SURGERY     Social History   Social History  . Marital status: Widowed    Spouse name: N/A  . Number of children: 2   Occupational History  . 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 Smoking status: Former Smoker    Years: 20.00    Quit date: 09/07/1989  . Smokeless tobacco: Never Used  . Alcohol  use No  . Drug use: No   Current Outpatient Medications on File Prior to Visit  Medication Sig Dispense Refill  . acetaminophen (TYLENOL) 325 MG tablet Take 650 mg by mouth every 6 (six) hours as needed.    Marland Kitchen atorvastatin (LIPITOR) 40 MG tablet Take 40 mg by mouth daily.    . bisacodyl (DULCOLAX) 5 MG EC tablet Take 5 mg by mouth daily as needed for moderate constipation. As needed every three days    . Calcium Carbonate-Vit D-Min (CALCIUM 1200 PO) Take 1 tablet by mouth daily.    . Cholecalciferol (VITAMIN D3) 2000 units TABS Take 2,000 Units by mouth daily.    Marland Kitchen  diltiazem (CARDIZEM CD) 180 MG 24 hr capsule Take 1 capsule (180 mg total) by mouth daily. 90 capsule 3  . diphenhydrAMINE (BANOPHEN) 25 MG tablet Take 25 mg by mouth daily.    Marland Kitchen docusate sodium (COLACE) 100 MG capsule Take 1 capsule by mouth daily.    . Dulaglutide (TRULICITY) 1.5 EX/5.2WU SOPN Inject 1.5 mg into the skin once a week. 4 pen 2  . fenofibrate 54 MG tablet TAKE 1 TABLET(54 MG) BY MOUTH DAILY 90 tablet 1  . fexofenadine (ALLEGRA) 180 MG tablet Take 180 mg by mouth daily.      . Flaxseed, Linseed, (FLAXSEED OIL) 1000 MG CAPS Take by mouth 2 (two) times daily.    . furosemide (LASIX) 20 MG tablet Take 2 tablets (40 mg total) by mouth daily as needed. (Patient taking differently: Take 40 mg by mouth daily as needed. Three times weekly) 90 tablet 1  . glipiZIDE (GLUCOTROL) 10 MG tablet Take 1 tablet (10 mg total) by mouth 2 (two) times daily before a meal. 180 tablet 1  . insulin aspart (NOVOLOG) 100 UNIT/ML injection Inject into the skin.    Marland Kitchen insulin glargine (LANTUS) 100 UNIT/ML injection Inject 0.8 mLs (80 Units total) into the skin daily. 30 mL 3  . Insulin Lispro (HUMALOG [place]) Inject 30-35 Units into the skin.    . Insulin Pen Needle (B-D UF III MINI PEN NEEDLES) 31G X 5 MM MISC Use daily for insulin injection 100 each 2  . Insulin Syringe-Needle U-100 (INSULIN SYRINGE 1CC/30GX5/16") 30G X 5/16" 1 ML MISC USE BID UTD   11  . levothyroxine (SYNTHROID, LEVOTHROID) 112 MCG tablet TAKE 1 TABLET BY MOUTH DAILY 90 tablet 0  . Multiple Vitamin (MULTIVITAMIN) tablet Take 1 tablet by mouth daily.      . potassium citrate (UROCIT-K) 10 MEQ (1080 MG) SR tablet TK 2 TABLETS PO BID WITH MEALS  4  . Probiotic Product (PROBIOTIC PO) Take 1 capsule by mouth daily.    . ranitidine (ZANTAC) 150 MG tablet Take 150 mg by mouth daily as needed for heartburn.    . sertraline (ZOLOFT) 100 MG tablet Take 1 tablet by mouth daily.  4  . vitamin C (ASCORBIC ACID) 250 MG tablet Take 250 mg by mouth daily.     No current facility-administered medications on file prior to visit.    Allergies  Allergen Reactions  . Apixaban Other (See Comments)    Internal Bleeding  . Aspirin Itching, Rash, Hives and Swelling    Swelling of her tongue  . Mirabegron Other (See Comments)    Patient experienced A-Fib  . Metformin Diarrhea and Nausea Only  . Pineapple Swelling    Throat swells and blisters on tongue and roof of mouth per patient  . Tetracycline Hives  . Fluticasone-Salmeterol Itching and Rash  . Iodinated Diagnostic Agents Itching and Rash       . Lactose Intolerance (Gi) Other (See Comments)    gas  . Latex Itching, Rash and Other (See Comments)    Pt. States latex pulls her skin off.   . Lisinopril Cough  . Metronidazole Other (See Comments)    Unknown   . Sulfa Antibiotics Rash  . Sulfonamide Derivatives Itching and Rash   Family History  Problem Relation Age of Onset  . Allergies Mother   . Clotting disorder Mother   . Osteoarthritis Mother   . Asthma Mother   . Arthritis Other   . Diabetes Other   . Hyperlipidemia  Other   . Hypertension Other   . Coronary artery disease Other   . Stroke Other   . Osteoarthritis Daughter   . Rheum arthritis Maternal Grandmother   . Clotting disorder Maternal Grandmother   . Clotting disorder Maternal Uncle   . Clotting disorder Daughter   . Allergies Daughter   . Breast  cancer Neg Hx    PE: LMP  (LMP Unknown)  Wt Readings from Last 3 Encounters:  11/17/18 256 lb (116.1 kg)  09/28/18 254 lb (115.2 kg)  09/08/18 256 lb (116.1 kg)   Constitutional:  in NAD  The physical exam was not performed (virtual visit).  ASSESSMENT: 1. DM2, insulin-dependent, uncontrolled, with complications - CKD stage 4 - CHF - + DR  2. Obesity class 3  3.  Hypothyroidism   4. Hyperlipidemia  PLAN:  1. Patient with longstanding, uncontrolled, type 2 diabetes, insulin-dependent, previously on premixed insulin and glipizide, now on the basal-bolus insulin regimen, and also glipizide and added GLP-1 receptor agonist at last visit as sugars were still high at that time, only slightly improved from before.  She also continues the freestyle libre CGM, which she feels helped. - at this visit, her sugars are much better. They are still high in am, although improved and they decrease as the day goes by but may drop her sugars in the 70s at bedtime >> will stop the pm Glipizide (she is given this dose at 4 pm, not quite before dinner). To avoid nighttime lows we will also reduce the dinnertime Humalog. - I suggested to:  Patient Instructions  Please continue: - Glipizide 10 mg in am, but stop the afternoon dose - Trulicity 1.5 mg weekly - Lantus 80 units at bedtime   Please decrease: - Humalog  35 units before breakfast 30 units before lunch 25 units before dinner  Please continue Levothyroxine 112 mcg daily.  Take the thyroid hormone every day, with water, at least 30 minutes before breakfast, separated by at least 4 hours from: - acid reflux medications - calcium - iron - multivitamins  Please return in 3 months with your sugar log.  - will check her HbA1c when she returns to the clinic - continue checking sugars at different times of the day - check 4x a day, rotating checks - advised for yearly eye exams >> she is UTD - Return to clinic in 3 mo with sugar  log      2. Obesity class 3 - we started Trulicity at last visit, which should also help with wt loss  - no wt loss yet reportedly  3. HL - Reviewed latest lipid panel from 05/2018: TG high, LDL at goal Lab Results  Component Value Date   CHOL 131 05/19/2018   HDL 26.70 (L) 05/19/2018   LDLCALC 65 08/16/2009   LDLDIRECT 41.0 05/19/2018   TRIG (H) 05/19/2018    656.0 Triglyceride is over 400; calculations on Lipids are invalid.   CHOLHDL 5 05/19/2018  - Continues the statin and omega 3 FAs without side effects.  4.  Hypothyroidism  - latest thyroid labs reviewed with pt >> normal 05/2018 - she continues on LT4 112 mcg daily - pt feels good on this dose. - we discussed about taking the thyroid hormone every day, with water, >30 minutes before breakfast, separated by >4 hours from acid reflux medications, calcium, iron, multivitamins. Pt. is taking it correctly now. - will recheck her TFTs a next OV  Philemon Kingdom, MD PhD [facility] Endocr[facility]

## 2018-12-13 ENCOUNTER — Telehealth: Payer: Self-pay | Admitting: Internal Medicine

## 2018-12-13 ENCOUNTER — Telehealth: Payer: Self-pay

## 2018-12-13 ENCOUNTER — Encounter: Payer: Self-pay | Admitting: Internal Medicine

## 2018-12-13 DIAGNOSIS — I872 Venous insufficiency (chronic) (peripheral): Secondary | ICD-10-CM | POA: Diagnosis not present

## 2018-12-13 NOTE — Telephone Encounter (Signed)
Received VM from pt questioning if she can have labs drawn at [facility] again this week to avoid having to come into our office for Nplate.   Pam with [facility] has been working to see if Nplate could be administered at [facility] so pt could avoid our office during COVID-19. Called Pam for update.  Per Jeannene Patella, Nplate was approved for administration at [facility]: [facility] but pt's copay was going to be greater than $7000 per shot. Obviously patient cannot afford this weekly.  At this time, patient to continue having labs drawn at her home facility to then be reviewed by our providers to determine the need for injection. This information left on pt's personalized VM. dph

## 2018-12-13 NOTE — Telephone Encounter (Signed)
Patient called stating that her orders need to be changed at her living facility for change in insulin dosing.

## 2018-12-13 NOTE — Telephone Encounter (Signed)
I am one person working on Dr. Cruzita Lederer. I can only do so much at one time and trying my best.

## 2018-12-13 NOTE — Telephone Encounter (Signed)
See other calls and email. I am working as fast as I can with my current workload.

## 2018-12-13 NOTE — Telephone Encounter (Signed)
[facility] is calling to check the status of these orders being faxed

## 2018-12-15 DIAGNOSIS — I130 Hypertensive heart and chronic kidney disease with heart failure and stage 1 through stage 4 chronic kidney disease, or unspecified chronic kidney disease: Secondary | ICD-10-CM | POA: Diagnosis not present

## 2018-12-16 ENCOUNTER — Other Ambulatory Visit: Payer: Self-pay

## 2018-12-16 ENCOUNTER — Inpatient Hospital Stay: Payer: Medicare Other

## 2018-12-16 DIAGNOSIS — D693 Immune thrombocytopenic purpura: Secondary | ICD-10-CM | POA: Diagnosis not present

## 2018-12-16 DIAGNOSIS — Z79899 Other long term (current) drug therapy: Secondary | ICD-10-CM | POA: Diagnosis not present

## 2018-12-16 DIAGNOSIS — I4891 Unspecified atrial fibrillation: Secondary | ICD-10-CM | POA: Diagnosis not present

## 2018-12-16 DIAGNOSIS — Z86711 Personal history of pulmonary embolism: Secondary | ICD-10-CM | POA: Diagnosis not present

## 2018-12-16 DIAGNOSIS — D509 Iron deficiency anemia, unspecified: Secondary | ICD-10-CM | POA: Diagnosis not present

## 2018-12-16 MED ORDER — ROMIPLOSTIM INJECTION 500 MCG
815.0000 ug | Freq: Once | SUBCUTANEOUS | Status: AC
  Administered 2018-12-16: 815 ug via SUBCUTANEOUS
  Filled 2018-12-16: qty 1.63

## 2018-12-16 NOTE — Patient Instructions (Signed)
Romiplostim injection What is this medicine? ROMIPLOSTIM (roe mi PLOE stim) helps your body make more platelets. This medicine is used to treat low platelets caused by chronic idiopathic thrombocytopenic purpura (ITP). This medicine may be used for other purposes; ask your health care provider or pharmacist if you have questions. COMMON BRAND NAME(S): Nplate What should I tell my health care provider before I take this medicine? They need to know if you have any of these conditions: -bleeding disorders -bone marrow problem, like blood cancer or myelodysplastic syndrome -history of blood clots -liver disease -surgery to remove your spleen -an unusual or allergic reaction to romiplostim, mannitol, other medicines, foods, dyes, or preservatives -pregnant or trying to get pregnant -breast-feeding How should I use this medicine? This medicine is for injection under the skin. It is given by a health care professional in a hospital or clinic setting. A special MedGuide will be given to you before your injection. Read this information carefully each time. Talk to your pediatrician regarding the use of this medicine in children. While this drug may be prescribed for children as young as 1 year for selected conditions, precautions do apply. Overdosage: If you think you have taken too much of this medicine contact a poison control center or emergency room at once. NOTE: This medicine is only for you. Do not share this medicine with others. What if I miss a dose? It is important not to miss your dose. Call your doctor or health care professional if you are unable to keep an appointment. What may interact with this medicine? Interactions are not expected. This list may not describe all possible interactions. Give your health care provider a list of all the medicines, herbs, non-prescription drugs, or dietary supplements you use. Also tell them if you smoke, drink alcohol, or use illegal drugs. Some items  may interact with your medicine. What should I watch for while using this medicine? Your condition will be monitored carefully while you are receiving this medicine. Visit your prescriber or health care professional for regular checks on your progress and for the needed blood tests. It is important to keep all appointments. What side effects may I notice from receiving this medicine? Side effects that you should report to your doctor or health care professional as soon as possible: -allergic reactions like skin rash, itching or hives, swelling of the face, lips, or tongue -signs and symptoms of bleeding such as bloody or black, tarry stools; red or dark brown urine; spitting up blood or brown material that looks like coffee grounds; red spots on the skin; unusual bruising or bleeding from the eyes, gums, or nose -signs and symptoms of a blood clot such as chest pain; shortness of breath; pain, swelling, or warmth in the leg -signs and symptoms of a stroke like changes in vision; confusion; trouble speaking or understanding; severe headaches; sudden numbness or weakness of the face, arm or leg; trouble walking; dizziness; loss of balance or coordination Side effects that usually do not require medical attention (report to your doctor or health care professional if they continue or are bothersome): -headache -pain in arms and legs -pain in mouth -stomach pain This list may not describe all possible side effects. Call your doctor for medical advice about side effects. You may report side effects to FDA at 1-800-FDA-1088. Where should I keep my medicine? This drug is given in a hospital or clinic and will not be stored at home. NOTE: This sheet is a summary. It may not   cover all possible information. If you have questions about this medicine, talk to your doctor, pharmacist, or health care provider.  2019 Elsevier/Gold Standard (2017-08-23 11:10:55)  

## 2018-12-16 NOTE — Progress Notes (Signed)
Patient arrived to clinic for Nplate injection, inadvertently received Udenyca.   Per Physician, proceed with administering NPlate as originally ordered.  Nplate injection given.

## 2018-12-16 NOTE — Progress Notes (Signed)
Labs were drawn at [facility] where the patient is a resident.  12/15/18   PLTC = 38.

## 2018-12-19 ENCOUNTER — Telehealth: Payer: Self-pay | Admitting: *Deleted

## 2018-12-19 NOTE — Telephone Encounter (Signed)
Call to [facility], [facility] for pt labs CBC and CMET to be drawn at facility weekly for pt's Injections on Fridays. Results will be faxed to Dr. Lavona Mound office on Thursdays.

## 2018-12-21 DIAGNOSIS — N183 Chronic kidney disease, stage 3 (moderate): Secondary | ICD-10-CM | POA: Diagnosis not present

## 2018-12-22 ENCOUNTER — Encounter: Payer: Self-pay | Admitting: *Deleted

## 2018-12-22 NOTE — Progress Notes (Signed)
Lab results received via fax from [facility] platelet count from 11/20/18-38.  Pharmacy notified.

## 2018-12-23 ENCOUNTER — Other Ambulatory Visit: Payer: Self-pay

## 2018-12-23 ENCOUNTER — Inpatient Hospital Stay (HOSPITAL_BASED_OUTPATIENT_CLINIC_OR_DEPARTMENT_OTHER): Payer: Medicare Other | Admitting: Hematology & Oncology

## 2018-12-23 ENCOUNTER — Inpatient Hospital Stay: Payer: Medicare Other

## 2018-12-23 VITALS — BP 118/50 | HR 122 | Temp 98.4°F

## 2018-12-23 DIAGNOSIS — Z86711 Personal history of pulmonary embolism: Secondary | ICD-10-CM

## 2018-12-23 DIAGNOSIS — D693 Immune thrombocytopenic purpura: Secondary | ICD-10-CM

## 2018-12-23 DIAGNOSIS — D509 Iron deficiency anemia, unspecified: Secondary | ICD-10-CM

## 2018-12-23 DIAGNOSIS — K5900 Constipation, unspecified: Secondary | ICD-10-CM | POA: Diagnosis not present

## 2018-12-23 DIAGNOSIS — I4891 Unspecified atrial fibrillation: Secondary | ICD-10-CM

## 2018-12-23 DIAGNOSIS — M7918 Myalgia, other site: Secondary | ICD-10-CM | POA: Diagnosis not present

## 2018-12-23 DIAGNOSIS — Z79899 Other long term (current) drug therapy: Secondary | ICD-10-CM | POA: Diagnosis not present

## 2018-12-23 DIAGNOSIS — R1084 Generalized abdominal pain: Secondary | ICD-10-CM | POA: Diagnosis not present

## 2018-12-23 MED ORDER — ROMIPLOSTIM INJECTION 500 MCG
930.0000 ug | Freq: Once | SUBCUTANEOUS | Status: AC
  Administered 2018-12-23: 930 ug via SUBCUTANEOUS
  Filled 2018-12-23: qty 1.86

## 2018-12-23 NOTE — Progress Notes (Signed)
Hematology and Oncology Follow Up Visit  [name] [id number] [birth year] 84 y.o. 12/23/2018   Principle Diagnosis:  Immune based thrombocytopenia History of PE Iron deficiency anemia Atrial fib  Current Therapy:   Nplate q week for platelet count > 80K IV Iron as indicated - dose given on 07/07/2017   Interim History:  [name] is here today for follow-up and Nplate.  She was does respond to the Nplate.  Her platelet count today is 30,000.  She is holding her own.  She is still out the nursing center.  Her blood sugars are still on the high side.  She says that her hemoglobin A1c was 6.7.  I do not think she has any surgery planned which is always encouraging.  She has had no bleeding.  She has had no fever.  She has had no nausea or vomiting.  There is been no headache.  Patient has had cellulitis in her legs.  She is seeing the wound clinic for this.  This seems to be getting a little bit better from what she says.  She has had no problems with diarrhea.  There is been no mouth sores.  Overall, her performance status is ECOG 2.  Medications:  Allergies as of 12/23/2018      Reactions   Apixaban Other (See Comments)   Internal Bleeding   Aspirin Itching, Rash, Hives, Swelling   Swelling of her tongue   Mirabegron Other (See Comments)   Patient experienced A-Fib   Metformin Diarrhea, Nausea Only   Pineapple Swelling   Throat swells and blisters on tongue and roof of mouth per patient   Tetracycline Hives   Fluticasone-salmeterol Itching, Rash   Iodinated Diagnostic Agents Itching, Rash      Lactose Intolerance (gi) Other (See Comments)   gas   Latex Itching, Rash, Other (See Comments)   Pt. States latex pulls her skin off.    Lisinopril Cough   Metronidazole Other (See Comments)   Unknown   Sulfa Antibiotics Rash   Sulfonamide Derivatives Itching, Rash      Medication List       Accurate as of December 23, 2018  4:22 PM. Always use your most  recent med list.        acetaminophen 325 MG tablet Commonly known as:  TYLENOL Take 650 mg by mouth every 6 (six) hours as needed.   atorvastatin 40 MG tablet Commonly known as:  LIPITOR Take 40 mg by mouth daily.   Banophen 25 MG tablet Generic drug:  diphenhydrAMINE Take 25 mg by mouth daily.   bisacodyl 5 MG EC tablet Commonly known as:  DULCOLAX Take 5 mg by mouth daily as needed for moderate constipation. As needed every three days   CALCIUM 1200 PO Take 1 tablet by mouth daily.   diltiazem 180 MG 24 hr capsule Commonly known as:  CARDIZEM CD Take 1 capsule (180 mg total) by mouth daily.   docusate sodium 100 MG capsule Commonly known as:  COLACE Take 1 capsule by mouth daily.   Dulaglutide 1.5 MG/0.5ML Sopn Commonly known as:  Trulicity Inject 1.5 mg into the skin once a week.   fenofibrate 54 MG tablet TAKE 1 TABLET(54 MG) BY MOUTH DAILY   fexofenadine 180 MG tablet Commonly known as:  ALLEGRA Take 180 mg by mouth daily.   Flaxseed Oil 1000 MG Caps Take by mouth 2 (two) times daily.   furosemide 20 MG tablet Commonly known as:  LASIX Take 2 tablets (  40 mg total) by mouth daily as needed.   glipiZIDE 10 MG tablet Commonly known as:  GLUCOTROL Take 1 tablet (10 mg total) by mouth 2 (two) times daily before a meal.   HUMALOG [place] Inject 30-35 Units into the skin.   insulin aspart 100 UNIT/ML injection Commonly known as:  novoLOG Inject into the skin.   insulin glargine 100 UNIT/ML injection Commonly known as:  Lantus Inject 0.8 mLs (80 Units total) into the skin daily.   Insulin Pen Needle 31G X 5 MM Misc Commonly known as:  B-D UF III MINI PEN NEEDLES Use daily for insulin injection   INSULIN SYRINGE 1CC/30GX5/16" 30G X 5/16" 1 ML Misc USE BID UTD   levothyroxine 112 MCG tablet Commonly known as:  SYNTHROID TAKE 1 TABLET BY MOUTH DAILY   multivitamin tablet Take 1 tablet by mouth daily.   potassium citrate 10 MEQ (1080 MG) SR tablet  Commonly known as:  UROCIT-K TK 2 TABLETS PO BID WITH MEALS   PROBIOTIC PO Take 1 capsule by mouth daily.   ranitidine 150 MG tablet Commonly known as:  ZANTAC Take 150 mg by mouth daily as needed for heartburn.   sertraline 100 MG tablet Commonly known as:  ZOLOFT Take 1 tablet by mouth daily.   vitamin C 250 MG tablet Commonly known as:  ASCORBIC ACID Take 250 mg by mouth daily.   Vitamin D3 50 MCG (2000 UT) Tabs Take 2,000 Units by mouth daily.       Allergies:  Allergies  Allergen Reactions  . Apixaban Other (See Comments)    Internal Bleeding  . Aspirin Itching, Rash, Hives and Swelling    Swelling of her tongue  . Mirabegron Other (See Comments)    Patient experienced A-Fib  . Metformin Diarrhea and Nausea Only  . Pineapple Swelling    Throat swells and blisters on tongue and roof of mouth per patient  . Tetracycline Hives  . Fluticasone-Salmeterol Itching and Rash  . Iodinated Diagnostic Agents Itching and Rash       . Lactose Intolerance (Gi) Other (See Comments)    gas  . Latex Itching, Rash and Other (See Comments)    Pt. States latex pulls her skin off.   . Lisinopril Cough  . Metronidazole Other (See Comments)    Unknown   . Sulfa Antibiotics Rash  . Sulfonamide Derivatives Itching and Rash    Past Medical History, Surgical history, Social history, and Family History were reviewed and updated.  Review of Systems: Review of Systems  Constitutional: Positive for malaise/fatigue.  HENT: Negative.   Eyes: Negative.   Respiratory: Negative.   Cardiovascular: Positive for palpitations.  Gastrointestinal: Positive for heartburn and nausea.  Genitourinary: Negative.   Musculoskeletal: Positive for joint pain and myalgias.  Skin: Negative.   Neurological: Positive for tingling.  Endo/Heme/Allergies: Bruises/bleeds easily.  Psychiatric/Behavioral: Negative.      Physical Exam:  vitals were not taken for this visit.   Wt Readings from Last  3 Encounters:  11/17/18 256 lb (116.1 kg)  09/28/18 254 lb (115.2 kg)  09/08/18 256 lb (116.1 kg)    Physical Exam Vitals signs reviewed.  HENT:     Head: Normocephalic and atraumatic.  Eyes:     Pupils: Pupils are equal, round, and reactive to light.  Neck:     Musculoskeletal: Normal range of motion.  Cardiovascular:     Rate and Rhythm: Normal rate and regular rhythm.     Heart sounds: Normal heart  sounds.     Comments: Cardiac exam shows an irregular rate and rhythm consistent with atrial fibrillation.  The rate is fairly well controlled.  She has no murmurs, rubs or bruits. Pulmonary:     Effort: Pulmonary effort is normal.     Breath sounds: Normal breath sounds.  Abdominal:     General: Bowel sounds are normal.     Palpations: Abdomen is soft.  Musculoskeletal: Normal range of motion.        General: No tenderness or deformity.  Lymphadenopathy:     Cervical: No cervical adenopathy.  Skin:    General: Skin is warm and dry.     Findings: No erythema or rash.  Neurological:     Mental Status: She is alert and oriented to person, place, and time.  Psychiatric:        Behavior: Behavior normal.        Thought Content: Thought content normal.        Judgment: Judgment normal.       Lab Results  Component Value Date   WBC 7.5 12/08/2018   HGB 11.6 (L) 12/08/2018   HCT 35.2 (L) 12/08/2018   MCV 95.7 12/08/2018   PLT 104 (L) 12/08/2018   Lab Results  Component Value Date   FERRITIN 281 10/26/2018   IRON 82 10/26/2018   TIBC 399 10/26/2018   UIBC 317 10/26/2018   IRONPCTSAT 21 10/26/2018   Lab Results  Component Value Date   RETICCTPCT 3.4 (H) 10/26/2018   RBC 3.68 (L) 12/08/2018   RETICCTABS 73.3 01/28/2011   No results found for: KPAFRELGTCHN, LAMBDASER, KAPLAMBRATIO No results found for: IGGSERUM, IGA, IGMSERUM No results found for: Odetta Pink, SPEI   Chemistry      Component Value Date/Time    NA 141 12/08/2018 1301   NA 145 09/08/2017 1317   NA 142 02/25/2017 1013   K 4.5 12/08/2018 1301   K 4.5 09/08/2017 1317   K 3.8 02/25/2017 1013   CL 96 (L) 12/08/2018 1301   CL 98 09/08/2017 1317   CO2 32 12/08/2018 1301   CO2 32 09/08/2017 1317   CO2 26 02/25/2017 1013   BUN 45 (H) 12/08/2018 1301   BUN 34 (H) 09/08/2017 1317   BUN 20.1 02/25/2017 1013   CREATININE 2.71 (H) 12/08/2018 1301   CREATININE 1.9 (H) 09/08/2017 1317   CREATININE 1.8 (H) 02/25/2017 1013      Component Value Date/Time   CALCIUM 10.0 12/08/2018 1301   CALCIUM 9.3 09/08/2017 1317   CALCIUM 8.7 02/25/2017 1013   ALKPHOS 32 (L) 12/08/2018 1301   ALKPHOS 45 09/08/2017 1317   ALKPHOS 65 02/25/2017 1013   AST 19 12/08/2018 1301   AST 16 02/25/2017 1013   ALT 15 12/08/2018 1301   ALT 26 09/08/2017 1317   ALT 13 02/25/2017 1013   BILITOT 0.7 12/08/2018 1301   BILITOT 0.76 02/25/2017 1013       Impression and Plan: Ms. Riggenbach is a very pleasant 84 yo caucasian female with immune based thrombocytopenia.   We will give her Nplate today.  Again, we really have to give her Nplate weekly for right now.  I really would like to see her platelet count above 80,000.   I will see her back myself in 3 weeks.     Volanda Napoleon, MD 4/17/20204:22 PM

## 2018-12-23 NOTE — Patient Instructions (Signed)
Romiplostim injection (NPLATE) What is this medicine? ROMIPLOSTIM (roe mi PLOE stim) helps your body make more platelets. This medicine is used to treat low platelets caused by chronic idiopathic thrombocytopenic purpura (ITP). This medicine may be used for other purposes; ask your health care provider or pharmacist if you have questions. COMMON BRAND NAME(S): Nplate What should I tell my health care provider before I take this medicine? They need to know if you have any of these conditions: -bleeding disorders -bone marrow problem, like blood cancer or myelodysplastic syndrome -history of blood clots -liver disease -surgery to remove your spleen -an unusual or allergic reaction to romiplostim, mannitol, other medicines, foods, dyes, or preservatives -pregnant or trying to get pregnant -breast-feeding How should I use this medicine? This medicine is for injection under the skin. It is given by a health care professional in a hospital or clinic setting. A special MedGuide will be given to you before your injection. Read this information carefully each time. Talk to your pediatrician regarding the use of this medicine in children. While this drug may be prescribed for children as young as 1 year for selected conditions, precautions do apply. Overdosage: If you think you have taken too much of this medicine contact a poison control center or emergency room at once. NOTE: This medicine is only for you. Do not share this medicine with others. What if I miss a dose? It is important not to miss your dose. Call your doctor or health care professional if you are unable to keep an appointment. What may interact with this medicine? Interactions are not expected. This list may not describe all possible interactions. Give your health care provider a list of all the medicines, herbs, non-prescription drugs, or dietary supplements you use. Also tell them if you smoke, drink alcohol, or use illegal drugs.  Some items may interact with your medicine. What should I watch for while using this medicine? Your condition will be monitored carefully while you are receiving this medicine. Visit your prescriber or health care professional for regular checks on your progress and for the needed blood tests. It is important to keep all appointments. What side effects may I notice from receiving this medicine? Side effects that you should report to your doctor or health care professional as soon as possible: -allergic reactions like skin rash, itching or hives, swelling of the face, lips, or tongue -signs and symptoms of bleeding such as bloody or black, tarry stools; red or dark brown urine; spitting up blood or brown material that looks like coffee grounds; red spots on the skin; unusual bruising or bleeding from the eyes, gums, or nose -signs and symptoms of a blood clot such as chest pain; shortness of breath; pain, swelling, or warmth in the leg -signs and symptoms of a stroke like changes in vision; confusion; trouble speaking or understanding; severe headaches; sudden numbness or weakness of the face, arm or leg; trouble walking; dizziness; loss of balance or coordination Side effects that usually do not require medical attention (report to your doctor or health care professional if they continue or are bothersome): -headache -pain in arms and legs -pain in mouth -stomach pain This list may not describe all possible side effects. Call your doctor for medical advice about side effects. You may report side effects to FDA at 1-800-FDA-1088. Where should I keep my medicine? This drug is given in a hospital or clinic and will not be stored at home. NOTE: This sheet is a summary. It may  not cover all possible information. If you have questions about this medicine, talk to your doctor, pharmacist, or health care provider.  2019 Elsevier/Gold Standard (2017-08-23 11:10:55)

## 2018-12-26 ENCOUNTER — Telehealth: Payer: Self-pay | Admitting: Hematology & Oncology

## 2018-12-26 NOTE — Telephone Encounter (Signed)
sw pt to confirm 4/24 lab/inj appt per 4/17 LOS. Pt will get print out of future appts on Friday

## 2018-12-28 DIAGNOSIS — N183 Chronic kidney disease, stage 3 (moderate): Secondary | ICD-10-CM | POA: Diagnosis not present

## 2018-12-30 ENCOUNTER — Other Ambulatory Visit: Payer: Medicare Other

## 2018-12-30 ENCOUNTER — Inpatient Hospital Stay: Payer: Medicare Other

## 2018-12-30 ENCOUNTER — Other Ambulatory Visit: Payer: Self-pay

## 2018-12-30 DIAGNOSIS — D693 Immune thrombocytopenic purpura: Secondary | ICD-10-CM

## 2018-12-30 DIAGNOSIS — D509 Iron deficiency anemia, unspecified: Secondary | ICD-10-CM | POA: Diagnosis not present

## 2018-12-30 DIAGNOSIS — Z86711 Personal history of pulmonary embolism: Secondary | ICD-10-CM | POA: Diagnosis not present

## 2018-12-30 DIAGNOSIS — I4891 Unspecified atrial fibrillation: Secondary | ICD-10-CM | POA: Diagnosis not present

## 2018-12-30 DIAGNOSIS — Z79899 Other long term (current) drug therapy: Secondary | ICD-10-CM | POA: Diagnosis not present

## 2018-12-30 MED ORDER — ROMIPLOSTIM INJECTION 500 MCG
930.0000 ug | Freq: Once | SUBCUTANEOUS | Status: AC
  Administered 2018-12-30: 930 ug via SUBCUTANEOUS
  Filled 2018-12-30: qty 1

## 2018-12-30 NOTE — Patient Instructions (Signed)
Romiplostim injection What is this medicine? ROMIPLOSTIM (roe mi PLOE stim) helps your body make more platelets. This medicine is used to treat low platelets caused by chronic idiopathic thrombocytopenic purpura (ITP). This medicine may be used for other purposes; ask your health care provider or pharmacist if you have questions. COMMON BRAND NAME(S): Nplate What should I tell my health care provider before I take this medicine? They need to know if you have any of these conditions: -bleeding disorders -bone marrow problem, like blood cancer or myelodysplastic syndrome -history of blood clots -liver disease -surgery to remove your spleen -an unusual or allergic reaction to romiplostim, mannitol, other medicines, foods, dyes, or preservatives -pregnant or trying to get pregnant -breast-feeding How should I use this medicine? This medicine is for injection under the skin. It is given by a health care professional in a hospital or clinic setting. A special MedGuide will be given to you before your injection. Read this information carefully each time. Talk to your pediatrician regarding the use of this medicine in children. While this drug may be prescribed for children as young as 1 year for selected conditions, precautions do apply. Overdosage: If you think you have taken too much of this medicine contact a poison control center or emergency room at once. NOTE: This medicine is only for you. Do not share this medicine with others. What if I miss a dose? It is important not to miss your dose. Call your doctor or health care professional if you are unable to keep an appointment. What may interact with this medicine? Interactions are not expected. This list may not describe all possible interactions. Give your health care provider a list of all the medicines, herbs, non-prescription drugs, or dietary supplements you use. Also tell them if you smoke, drink alcohol, or use illegal drugs. Some items  may interact with your medicine. What should I watch for while using this medicine? Your condition will be monitored carefully while you are receiving this medicine. Visit your prescriber or health care professional for regular checks on your progress and for the needed blood tests. It is important to keep all appointments. What side effects may I notice from receiving this medicine? Side effects that you should report to your doctor or health care professional as soon as possible: -allergic reactions like skin rash, itching or hives, swelling of the face, lips, or tongue -signs and symptoms of bleeding such as bloody or black, tarry stools; red or dark brown urine; spitting up blood or brown material that looks like coffee grounds; red spots on the skin; unusual bruising or bleeding from the eyes, gums, or nose -signs and symptoms of a blood clot such as chest pain; shortness of breath; pain, swelling, or warmth in the leg -signs and symptoms of a stroke like changes in vision; confusion; trouble speaking or understanding; severe headaches; sudden numbness or weakness of the face, arm or leg; trouble walking; dizziness; loss of balance or coordination Side effects that usually do not require medical attention (report to your doctor or health care professional if they continue or are bothersome): -headache -pain in arms and legs -pain in mouth -stomach pain This list may not describe all possible side effects. Call your doctor for medical advice about side effects. You may report side effects to FDA at 1-800-FDA-1088. Where should I keep my medicine? This drug is given in a hospital or clinic and will not be stored at home. NOTE: This sheet is a summary. It may not   cover all possible information. If you have questions about this medicine, talk to your doctor, pharmacist, or health care provider.  2019 Elsevier/Gold Standard (2017-08-23 11:10:55)  

## 2018-12-30 NOTE — Progress Notes (Signed)
Patient didn't want to come into the office today to get Nplate injection due to the Coronavirus. This nurse went out to the car and gave the injection. She lives at [facility] and they brought her today.

## 2019-01-04 ENCOUNTER — Telehealth: Payer: Self-pay

## 2019-01-04 DIAGNOSIS — R112 Nausea with vomiting, unspecified: Secondary | ICD-10-CM | POA: Diagnosis not present

## 2019-01-04 DIAGNOSIS — R197 Diarrhea, unspecified: Secondary | ICD-10-CM | POA: Diagnosis not present

## 2019-01-04 DIAGNOSIS — E119 Type 2 diabetes mellitus without complications: Secondary | ICD-10-CM | POA: Diagnosis not present

## 2019-01-04 DIAGNOSIS — N183 Chronic kidney disease, stage 3 (moderate): Secondary | ICD-10-CM | POA: Diagnosis not present

## 2019-01-04 NOTE — Telephone Encounter (Signed)
Received fax results of pt's CBC, CMET. Platelet count 227. Per Dr Marin Olp, no need for NPlate this week. Labs to be scanned into Epic.   Notified patient by phone who verbalizes understanding and appreciation. Pt verbalizes and repeats back that 01/06/2019 appts will be canceled and she will follow up as scheduled on 01/13/2019. dph

## 2019-01-05 DIAGNOSIS — E1165 Type 2 diabetes mellitus with hyperglycemia: Secondary | ICD-10-CM | POA: Diagnosis not present

## 2019-01-05 DIAGNOSIS — R197 Diarrhea, unspecified: Secondary | ICD-10-CM | POA: Diagnosis not present

## 2019-01-05 DIAGNOSIS — R112 Nausea with vomiting, unspecified: Secondary | ICD-10-CM | POA: Diagnosis not present

## 2019-01-05 DIAGNOSIS — E1122 Type 2 diabetes mellitus with diabetic chronic kidney disease: Secondary | ICD-10-CM | POA: Diagnosis not present

## 2019-01-06 ENCOUNTER — Other Ambulatory Visit: Payer: Medicare Other

## 2019-01-06 ENCOUNTER — Ambulatory Visit: Payer: Medicare Other

## 2019-01-09 DIAGNOSIS — Z794 Long term (current) use of insulin: Secondary | ICD-10-CM | POA: Diagnosis not present

## 2019-01-09 DIAGNOSIS — E1122 Type 2 diabetes mellitus with diabetic chronic kidney disease: Secondary | ICD-10-CM | POA: Diagnosis not present

## 2019-01-10 ENCOUNTER — Telehealth: Payer: Self-pay | Admitting: Internal Medicine

## 2019-01-10 DIAGNOSIS — I872 Venous insufficiency (chronic) (peripheral): Secondary | ICD-10-CM | POA: Diagnosis not present

## 2019-01-10 DIAGNOSIS — N183 Chronic kidney disease, stage 3 (moderate): Secondary | ICD-10-CM | POA: Diagnosis not present

## 2019-01-10 DIAGNOSIS — Z794 Long term (current) use of insulin: Secondary | ICD-10-CM | POA: Diagnosis not present

## 2019-01-10 DIAGNOSIS — E119 Type 2 diabetes mellitus without complications: Secondary | ICD-10-CM | POA: Diagnosis not present

## 2019-01-10 NOTE — Telephone Encounter (Signed)
I thought we already did this... Melissa, Can you fax them the latest AVS at fax [phone].

## 2019-01-10 NOTE — Telephone Encounter (Signed)
[name] triage line called in regards to [name] calling from [facility], who paged there department instead of our office.  Stated that she said the patient needed to be off of glipiZIDE (GLUCOTROL) 10 MG tablet due to low blood sugars. Unable to transfer call due to already disconnecting.  Please Advise, Thanks

## 2019-01-11 NOTE — Telephone Encounter (Signed)
After hours fax received stating low blood sugars and would like to stop Glipizde completely.  I called the number on the message to find out what her numbers have been running and if she has been taking just 10 MG daily and not the 20 she had been on.  LM.

## 2019-01-12 ENCOUNTER — Telehealth: Payer: Self-pay | Admitting: Hematology & Oncology

## 2019-01-12 NOTE — Telephone Encounter (Signed)
Called and spoke with patient regarding appointments for 5/8 being r/s until 5/14/ Date/time ok with patient per 5/7 staff message

## 2019-01-13 ENCOUNTER — Inpatient Hospital Stay: Payer: Medicare Other

## 2019-01-13 ENCOUNTER — Inpatient Hospital Stay: Payer: Medicare Other | Admitting: Hematology & Oncology

## 2019-01-17 ENCOUNTER — Ambulatory Visit (INDEPENDENT_AMBULATORY_CARE_PROVIDER_SITE_OTHER): Payer: Medicare Other | Admitting: Cardiology

## 2019-01-17 ENCOUNTER — Encounter: Payer: Self-pay | Admitting: Cardiology

## 2019-01-17 ENCOUNTER — Other Ambulatory Visit: Payer: Self-pay

## 2019-01-17 VITALS — BP 120/50 | HR 84 | Ht 61.0 in | Wt 252.0 lb

## 2019-01-17 DIAGNOSIS — I480 Paroxysmal atrial fibrillation: Secondary | ICD-10-CM | POA: Diagnosis not present

## 2019-01-17 DIAGNOSIS — I483 Typical atrial flutter: Secondary | ICD-10-CM

## 2019-01-17 NOTE — Progress Notes (Signed)
Cardiology Office Note:    Date:  01/17/2019   ID:  [name], DOB February 08, 1935, MRN [id number]  PCP:  Javier Glazier, MD  Cardiologist:  [name], MD  Electrophysiologist:  None   Referring MD: Javier Glazier, MD   No chief complaint on file. Atrial fibrillation follow-up  History of Present Illness:    [name] is a 84 y.o. female with atrial fibrillation palpitations for years placed on Eliquis with thrombocytopenia follow-up by hematology.  She has been off of anticoagulation for several years.  Back on 05/24/2018 she was noted to be in SVT either atrial flutter versus atrial tachycardia, fairly unaware slightly fatigued.  Rate control was her goal and her diltiazem was increased to 180 mg once a day.  Atrial fibrillation clinic with Roderic Palau, NP has been seeing her and she has been very well rate controlled in the 80s.  [employer]. Stress lifted.   Platlets are better. Legs feel better.   Past Medical History:  Diagnosis Date  . Allergic rhinitis   . Anxiety state, unspecified    panic attacks  . CHF (congestive heart failure) ([place])   . Depressive disorder, not elsewhere classified   . Extrinsic asthma, unspecified    no problem since adulthood  . Obesity   . OSA on CPAP    severe  . Pure hypercholesterolemia   . Respiratory failure with hypoxia ([place]) 09/2008   acute, secondary to multiple bilateral pulmonary embolism , negative hypercoagulable workup 09/2008 hospital stay  . Scoliosis   . Syncope and collapse   . Type II or unspecified type diabetes mellitus without mention of complication, not stated as uncontrolled   . Unspecified essential hypertension   . Unspecified hypothyroidism    hypo    Past Surgical History:  Procedure Laterality Date  . EYE SURGERY    . JOINT REPLACEMENT    . knee replaced    . THYROID SURGERY      Current Medications: Current Meds  Medication Sig  . acetaminophen (TYLENOL) 325 MG tablet Take  650 mg by mouth every 6 (six) hours as needed.  Marland Kitchen atorvastatin (LIPITOR) 40 MG tablet Take 40 mg by mouth daily.  . bisacodyl (DULCOLAX) 5 MG EC tablet Take 5 mg by mouth daily as needed for moderate constipation. As needed every three days  . Calcium Carbonate-Vit D-Min (CALCIUM 1200 PO) Take 1 tablet by mouth daily.  . Cholecalciferol (VITAMIN D3) 2000 units TABS Take 2,000 Units by mouth daily.  Marland Kitchen diltiazem (CARDIZEM CD) 180 MG 24 hr capsule Take 1 capsule (180 mg total) by mouth daily.  . diphenhydrAMINE (BANOPHEN) 25 MG tablet Take 25 mg by mouth daily.  Marland Kitchen docusate sodium (COLACE) 100 MG capsule Take 1 capsule by mouth daily.  . Dulaglutide (TRULICITY) 1.5 GU/4.4IH SOPN Inject 1.5 mg into the skin once a week.  . fenofibrate 54 MG tablet TAKE 1 TABLET(54 MG) BY MOUTH DAILY  . fexofenadine (ALLEGRA) 180 MG tablet Take 180 mg by mouth daily.    . Flaxseed, Linseed, (FLAXSEED OIL) 1000 MG CAPS Take by mouth 2 (two) times daily.  . furosemide (LASIX) 20 MG tablet Take 2 tablets (40 mg total) by mouth daily as needed.  Marland Kitchen glipiZIDE (GLUCOTROL) 10 MG tablet Take 1 tablet (10 mg total) by mouth 2 (two) times daily before a meal.  . insulin aspart (NOVOLOG) 100 UNIT/ML injection Inject into the skin.  Marland Kitchen insulin glargine (LANTUS) 100 UNIT/ML injection Inject  0.8 mLs (80 Units total) into the skin daily.  . Insulin Lispro (HUMALOG [place]) Inject 30-35 Units into the skin.  . Insulin Pen Needle (B-D UF III MINI PEN NEEDLES) 31G X 5 MM MISC Use daily for insulin injection  . Insulin Syringe-Needle U-100 (INSULIN SYRINGE 1CC/30GX5/16") 30G X 5/16" 1 ML MISC USE BID UTD  . levothyroxine (SYNTHROID, LEVOTHROID) 112 MCG tablet TAKE 1 TABLET BY MOUTH DAILY  . Multiple Vitamin (MULTIVITAMIN) tablet Take 1 tablet by mouth daily.    . potassium citrate (UROCIT-K) 10 MEQ (1080 MG) SR tablet TK 2 TABLETS PO BID WITH MEALS  . Probiotic Product (PROBIOTIC PO) Take 1 capsule by mouth daily.  . ranitidine (ZANTAC) 150  MG tablet Take 150 mg by mouth daily as needed for heartburn.  . sertraline (ZOLOFT) 100 MG tablet Take 1 tablet by mouth daily.  . vitamin C (ASCORBIC ACID) 250 MG tablet Take 250 mg by mouth daily.     Allergies:   Apixaban; Aspirin; Mirabegron; Metformin; Pineapple; Tetracycline; Fluticasone-salmeterol; Iodinated diagnostic agents; Lactose intolerance (gi); Latex; Lisinopril; Metronidazole; Sulfa antibiotics; and Sulfonamide derivatives   Social History   Socioeconomic History  . Marital status: Widowed    Spouse name: Not on file  . Number of children: 2  . Years of education: Not on file  . Highest education level: Not on file  Occupational History  . Occupation: OWNER    Employer: [employer]: Self employed- runs Advertising copywriter  Social Needs  . Financial resource strain: Not on file  . Food insecurity:    Worry: Not on file    Inability: Not on file  . Transportation needs:    Medical: Not on file    Non-medical: Not on file  Tobacco Use  . Smoking status: Former Smoker    Years: 20.00    Last attempt to quit: 09/07/1989    Years since quitting: 29.3  . Smokeless tobacco: Never Used  Substance and Sexual Activity  . Alcohol use: No  . Drug use: No  . Sexual activity: Not Currently  Lifestyle  . Physical activity:    Days per week: Not on file    Minutes per session: Not on file  . Stress: Not on file  Relationships  . Social connections:    Talks on phone: Not on file    Gets together: Not on file    Attends religious service: Not on file    Active member of club or organization: Not on file    Attends meetings of clubs or organizations: Not on file    Relationship status: Not on file  Other Topics Concern  . Not on file  Social History Narrative  . Not on file     Family History: The patient's family history includes Allergies in her daughter and mother; Arthritis in an other family member; Asthma in her mother; Clotting disorder in her  daughter, maternal grandmother, maternal uncle, and mother; Coronary artery disease in an other family member; Diabetes in an other family member; Hyperlipidemia in an other family member; Hypertension in an other family member; Osteoarthritis in her daughter and mother; Rheum arthritis in her maternal grandmother; Stroke in an other family member. There is no history of Breast cancer.  ROS:   Please see the history of present illness.    No fevers, chills, no orthopnea. No CP, no palpitations.  All other systems reviewed and are negative.  EKGs/Labs/Other Studies Reviewed:    The  following studies were reviewed today: Echocardiogram 01/25/2017: - Left ventricle: The cavity size was normal. There was mild   concentric hypertrophy. Systolic function was vigorous. The   estimated ejection fraction was in the range of 65% to 70%. Wall   motion was normal; there were no regional wall motion   abnormalities. Features are consistent with a pseudonormal left   ventricular filling pattern, with concomitant abnormal relaxation   and increased filling pressure (grade 2 diastolic dysfunction).   Doppler parameters are consistent with elevated ventricular   end-diastolic filling pressure. - Aortic valve: Trileaflet; normal thickness leaflets. There was no   regurgitation. - Aortic root: The aortic root was normal in size. - Mitral valve: Calcified annulus. Mildly thickened leaflets .   There was mild regurgitation. - Left atrium: The atrium was moderately dilated. - Right ventricle: The cavity size was normal. Wall thickness was   normal. Systolic function was normal. - Right atrium: The atrium was normal in size. - Tricuspid valve: There was no regurgitation. - Pulmonary arteries: Systolic pressure was mildly increased. PA   peak pressure: 35 mm Hg (S). - Inferior vena cava: The vessel was normal in size. - Pericardium, extracardiac: There was no pericardial effusion.  EKG: None today.   07/04/2018 Recent Labs: 05/19/2018: TSH 0.92 12/08/2018: ALT 15; BUN 45; Creatinine 2.71; Hemoglobin 11.6; Platelet Count 104; Potassium 4.5; Sodium 141  Recent Lipid Panel    Component Value Date/Time   CHOL 131 05/19/2018 1140   TRIG (H) 05/19/2018 1140    656.0 Triglyceride is over 400; calculations on Lipids are invalid.   HDL 26.70 (L) 05/19/2018 1140   CHOLHDL 5 05/19/2018 1140   VLDL 118.2 (H) 09/25/2011 1511   LDLCALC 65 08/16/2009 0902   LDLDIRECT 41.0 05/19/2018 1140    Physical Exam:    VS:  BP (!) 120/50   Pulse 84   Ht 5\' 1"  (1.549 m)   Wt 252 lb (114.3 kg)   LMP  (LMP Unknown)   SpO2 93%   BMI 47.61 kg/m     Wt Readings from Last 3 Encounters:  01/17/19 252 lb (114.3 kg)  11/17/18 256 lb (116.1 kg)  09/28/18 254 lb (115.2 kg)     GEN:  Well nourished, well developed in no acute distress HEENT: Normal NECK: No JVD; No carotid bruits LYMPHATICS: No lymphadenopathy CARDIAC: RRR, no murmurs, rubs, gallops RESPIRATORY:  Clear to auscultation without rales, wheezing or rhonchi  ABDOMEN: Soft, non-tender, non-distended, obese MUSCULOSKELETAL:  No edema; No deformity  SKIN: Warm and dry NEUROLOGIC:  Alert and oriented x 3 PSYCHIATRIC:  Normal affect   ASSESSMENT:    1. Paroxysmal atrial fibrillation (HCC)   2. Typical atrial flutter ([place])   3. Morbid obesity ([place])    PLAN:    In order of problems listed above:  Paroxysmal atrial fibrillation/flutter -CHADSVASc-5, not candidate for anticoagulation, rate control.  Not a candidate for ablation. - Hemoglobin 11.6, hemoglobin A1c 7.3, creatinine 2.7 on 12/08/2018  Chronic kidney disease stage III/IV - Avoid NSAIDs.  Per primary team.  Numbers are stable.  Essential hypertension -Medications reviewed stable.  Obstructive sleep apnea - Continue with CPAP encouragement.  She is doing great with this.  She cannot even hear her machine.  Thrombocytopenia  - 36 to 340, much improved.  Her Link Snuffer is that  since she has less stress at [facility], all of her numbers have gotten better.   Medication Adjustments/Labs and Tests Ordered: Current medicines are reviewed at  length with the patient today.  Concerns regarding medicines are outlined above.  No orders of the defined types were placed in this encounter.  No orders of the defined types were placed in this encounter.   Patient Instructions  Medication Instructions:  The current medical regimen is effective;  continue present plan and medications.  If you need a refill on your cardiac medications before your next appointment, please call your pharmacy.   Follow-Up: At [facility], you and your health needs are our priority.  As part of our continuing mission to provide you with exceptional heart care, we have created designated Provider Care Teams.  These Care Teams include your primary Cardiologist (physician) and Advanced Practice Providers (APPs -  Physician Assistants and Nurse Practitioners) who all work together to provide you with the care you need, when you need it. You will need a follow up appointment in 6 months with Cecilie Kicks, NP and 1 year with Dr Marlou Porch.  Please call our office 2 months in advance to schedule this appointment.  You may see [name], MD or one of the following Advanced Practice Providers on your designated Care Team:   Truitt Merle, NP Cecilie Kicks, NP . Kathyrn Drown, NP  Thank you for choosing [facility]!!         Signed, [name], MD  01/17/2019 3:41 PM    [place]

## 2019-01-17 NOTE — Patient Instructions (Signed)
Medication Instructions:  The current medical regimen is effective;  continue present plan and medications.  If you need a refill on your cardiac medications before your next appointment, please call your pharmacy.   Follow-Up: At [facility], you and your health needs are our priority.  As part of our continuing mission to provide you with exceptional heart care, we have created designated Provider Care Teams.  These Care Teams include your primary Cardiologist (physician) and Advanced Practice Providers (APPs -  Physician Assistants and Nurse Practitioners) who all work together to provide you with the care you need, when you need it. You will need a follow up appointment in 6 months with Laura Ingold, NP and 1 year with Dr Skains.  Please call our office 2 months in advance to schedule this appointment.  You may see Mark Skains, MD or one of the following Advanced Practice Providers on your designated Care Team:   Lori Gerhardt, NP Laura Ingold, NP . Jill McDaniel, NP  Thank you for choosing [facility]!!     

## 2019-01-18 DIAGNOSIS — N183 Chronic kidney disease, stage 3 (moderate): Secondary | ICD-10-CM | POA: Diagnosis not present

## 2019-01-19 ENCOUNTER — Inpatient Hospital Stay: Payer: Medicare Other

## 2019-01-19 ENCOUNTER — Encounter: Payer: Self-pay | Admitting: Family

## 2019-01-19 ENCOUNTER — Other Ambulatory Visit: Payer: Self-pay

## 2019-01-19 ENCOUNTER — Telehealth: Payer: Self-pay | Admitting: *Deleted

## 2019-01-19 ENCOUNTER — Telehealth: Payer: Self-pay | Admitting: Family

## 2019-01-19 ENCOUNTER — Inpatient Hospital Stay: Payer: Medicare Other | Attending: Hematology & Oncology | Admitting: Family

## 2019-01-19 VITALS — BP 151/59 | HR 117 | Resp 21 | Ht 61.0 in | Wt 259.4 lb

## 2019-01-19 DIAGNOSIS — D693 Immune thrombocytopenic purpura: Secondary | ICD-10-CM | POA: Insufficient documentation

## 2019-01-19 DIAGNOSIS — D509 Iron deficiency anemia, unspecified: Secondary | ICD-10-CM | POA: Insufficient documentation

## 2019-01-19 DIAGNOSIS — Z86711 Personal history of pulmonary embolism: Secondary | ICD-10-CM | POA: Diagnosis not present

## 2019-01-19 DIAGNOSIS — G629 Polyneuropathy, unspecified: Secondary | ICD-10-CM | POA: Diagnosis not present

## 2019-01-19 DIAGNOSIS — I4891 Unspecified atrial fibrillation: Secondary | ICD-10-CM | POA: Insufficient documentation

## 2019-01-19 MED ORDER — SODIUM CHLORIDE 0.9% FLUSH
3.0000 mL | Freq: Once | INTRAVENOUS | Status: DC | PRN
  Filled 2019-01-19: qty 10

## 2019-01-19 MED ORDER — SODIUM CHLORIDE 0.9 % IV SOLN: 510.0000 mg | Freq: Once | INTRAVENOUS | Status: DC

## 2019-01-19 MED ORDER — ROMIPLOSTIM INJECTION 500 MCG
8.0000 ug/kg | Freq: Once | SUBCUTANEOUS | Status: AC
  Administered 2019-01-19: 915 ug via SUBCUTANEOUS
  Filled 2019-01-19: qty 1.83

## 2019-01-19 MED ORDER — SODIUM CHLORIDE 0.9% FLUSH
10.0000 mL | INTRAVENOUS | Status: DC | PRN
  Filled 2019-01-19: qty 10

## 2019-01-19 NOTE — Patient Instructions (Signed)
Romiplostim injection What is this medicine? ROMIPLOSTIM (roe mi PLOE stim) helps your body make more platelets. This medicine is used to treat low platelets caused by chronic idiopathic thrombocytopenic purpura (ITP). This medicine may be used for other purposes; ask your health care provider or pharmacist if you have questions. COMMON BRAND NAME(S): Nplate What should I tell my health care provider before I take this medicine? They need to know if you have any of these conditions: -bleeding disorders -bone marrow problem, like blood cancer or myelodysplastic syndrome -history of blood clots -liver disease -surgery to remove your spleen -an unusual or allergic reaction to romiplostim, mannitol, other medicines, foods, dyes, or preservatives -pregnant or trying to get pregnant -breast-feeding How should I use this medicine? This medicine is for injection under the skin. It is given by a health care professional in a hospital or clinic setting. A special MedGuide will be given to you before your injection. Read this information carefully each time. Talk to your pediatrician regarding the use of this medicine in children. While this drug may be prescribed for children as young as 1 year for selected conditions, precautions do apply. Overdosage: If you think you have taken too much of this medicine contact a poison control center or emergency room at once. NOTE: This medicine is only for you. Do not share this medicine with others. What if I miss a dose? It is important not to miss your dose. Call your doctor or health care professional if you are unable to keep an appointment. What may interact with this medicine? Interactions are not expected. This list may not describe all possible interactions. Give your health care provider a list of all the medicines, herbs, non-prescription drugs, or dietary supplements you use. Also tell them if you smoke, drink alcohol, or use illegal drugs. Some items  may interact with your medicine. What should I watch for while using this medicine? Your condition will be monitored carefully while you are receiving this medicine. Visit your prescriber or health care professional for regular checks on your progress and for the needed blood tests. It is important to keep all appointments. What side effects may I notice from receiving this medicine? Side effects that you should report to your doctor or health care professional as soon as possible: -allergic reactions like skin rash, itching or hives, swelling of the face, lips, or tongue -signs and symptoms of bleeding such as bloody or black, tarry stools; red or dark brown urine; spitting up blood or brown material that looks like coffee grounds; red spots on the skin; unusual bruising or bleeding from the eyes, gums, or nose -signs and symptoms of a blood clot such as chest pain; shortness of breath; pain, swelling, or warmth in the leg -signs and symptoms of a stroke like changes in vision; confusion; trouble speaking or understanding; severe headaches; sudden numbness or weakness of the face, arm or leg; trouble walking; dizziness; loss of balance or coordination Side effects that usually do not require medical attention (report to your doctor or health care professional if they continue or are bothersome): -headache -pain in arms and legs -pain in mouth -stomach pain This list may not describe all possible side effects. Call your doctor for medical advice about side effects. You may report side effects to FDA at 1-800-FDA-1088. Where should I keep my medicine? This drug is given in a hospital or clinic and will not be stored at home. NOTE: This sheet is a summary. It may not   cover all possible information. If you have questions about this medicine, talk to your doctor, pharmacist, or health care provider.  2019 Elsevier/Gold Standard (2017-08-23 11:10:55)  

## 2019-01-19 NOTE — Telephone Encounter (Signed)
Appointments scheduled letter/calendar mailed per 5/14 los

## 2019-01-19 NOTE — Progress Notes (Signed)
Hematology and Oncology Follow Up Visit  [name] [id number] 06/03/1935 84 y.o. 01/19/2019   Principle Diagnosis:  Immune based thrombocytopenia History of PE Iron deficiency anemia Atrial fib  Current Therapy:   Nplate q week for platelet count > 80K IV Iron as indicated - dose given on 07/07/2017   Interim History:  [name] is here today for follow-up and Nplate injection. She states that she is feeling much better and has noticed an improvement in her energy.  Her platelet count is 104. She has not noted any episodes of bleeding but does bruise a little. No petechial rash.  She states that her cardiologist stopped her anticoagulant. She has atrial fib and will occasionally have mild palpitations.  No fever, chills, n/v, cough, rash, dizziness, SOB, chest pain, abdominal pain or changes in bowel or bladder habits.  Her abdominal pain, n/v and diarrhea have resolved.  She has mild neuropathy in her feet that is unchanged.  She is ambulating nicely with her rolling walker for support.  No falls or syncopal episodes to report.  No swelling or tenderness in her extremities.  No lymphadenopathy noted on exam.  She has maintained a good appetite and is staying well hydrated. Her weight is stable.   ECOG Performance Status: 1 - Symptomatic but completely ambulatory  Medications:  Allergies as of 01/19/2019      Reactions   Apixaban Other (See Comments)   Internal Bleeding   Aspirin Itching, Rash, Hives, Swelling   Swelling of her tongue   Mirabegron Other (See Comments)   Patient experienced A-Fib   Metformin Diarrhea, Nausea Only   Pineapple Swelling   Throat swells and blisters on tongue and roof of mouth per patient   Tetracycline Hives   Fluticasone-salmeterol Itching, Rash   Iodinated Diagnostic Agents Itching, Rash      Lactose Intolerance (gi) Other (See Comments)   gas   Latex Itching, Rash, Other (See Comments)   Pt. States latex pulls her skin off.     Lisinopril Cough   Metronidazole Other (See Comments)   Unknown   Sulfa Antibiotics Rash   Sulfonamide Derivatives Itching, Rash      Medication List       Accurate as of Jan 19, 2019 10:31 AM. If you have any questions, ask your nurse or doctor.        acetaminophen 325 MG tablet Commonly known as:  TYLENOL Take 650 mg by mouth every 6 (six) hours as needed.   atorvastatin 40 MG tablet Commonly known as:  LIPITOR Take 40 mg by mouth daily.   Banophen 25 MG tablet Generic drug:  diphenhydrAMINE Take 25 mg by mouth daily.   bisacodyl 5 MG EC tablet Commonly known as:  DULCOLAX Take 5 mg by mouth daily as needed for moderate constipation. As needed every three days   CALCIUM 1200 PO Take 1 tablet by mouth daily.   diltiazem 180 MG 24 hr capsule Commonly known as:  CARDIZEM CD Take 1 capsule (180 mg total) by mouth daily.   docusate sodium 100 MG capsule Commonly known as:  COLACE Take 1 capsule by mouth daily.   Dulaglutide 1.5 MG/0.5ML Sopn Commonly known as:  Trulicity Inject 1.5 mg into the skin once a week.   fenofibrate 54 MG tablet TAKE 1 TABLET(54 MG) BY MOUTH DAILY   fexofenadine 180 MG tablet Commonly known as:  ALLEGRA Take 180 mg by mouth daily.   Flaxseed Oil 1000 MG Caps Take by mouth  2 (two) times daily.   furosemide 20 MG tablet Commonly known as:  LASIX Take 2 tablets (40 mg total) by mouth daily as needed.   glipiZIDE 10 MG tablet Commonly known as:  GLUCOTROL Take 1 tablet (10 mg total) by mouth 2 (two) times daily before a meal.   HUMALOG [place] Inject 30-35 Units into the skin.   insulin aspart 100 UNIT/ML injection Commonly known as:  novoLOG Inject into the skin.   insulin glargine 100 UNIT/ML injection Commonly known as:  Lantus Inject 0.8 mLs (80 Units total) into the skin daily.   Insulin Pen Needle 31G X 5 MM Misc Commonly known as:  B-D UF III MINI PEN NEEDLES Use daily for insulin injection   INSULIN SYRINGE  1CC/30GX5/16" 30G X 5/16" 1 ML Misc USE BID UTD   levothyroxine 112 MCG tablet Commonly known as:  SYNTHROID TAKE 1 TABLET BY MOUTH DAILY   multivitamin tablet Take 1 tablet by mouth daily.   potassium citrate 10 MEQ (1080 MG) SR tablet Commonly known as:  UROCIT-K TK 2 TABLETS PO BID WITH MEALS   PROBIOTIC PO Take 1 capsule by mouth daily.   ranitidine 150 MG tablet Commonly known as:  ZANTAC Take 150 mg by mouth daily as needed for heartburn.   sertraline 100 MG tablet Commonly known as:  ZOLOFT Take 1 tablet by mouth daily.   vitamin C 250 MG tablet Commonly known as:  ASCORBIC ACID Take 250 mg by mouth daily.   Vitamin D3 50 MCG (2000 UT) Tabs Take 2,000 Units by mouth daily.       Allergies:  Allergies  Allergen Reactions  . Apixaban Other (See Comments)    Internal Bleeding  . Aspirin Itching, Rash, Hives and Swelling    Swelling of her tongue  . Mirabegron Other (See Comments)    Patient experienced A-Fib  . Metformin Diarrhea and Nausea Only  . Pineapple Swelling    Throat swells and blisters on tongue and roof of mouth per patient  . Tetracycline Hives  . Fluticasone-Salmeterol Itching and Rash  . Iodinated Diagnostic Agents Itching and Rash       . Lactose Intolerance (Gi) Other (See Comments)    gas  . Latex Itching, Rash and Other (See Comments)    Pt. States latex pulls her skin off.   . Lisinopril Cough  . Metronidazole Other (See Comments)    Unknown   . Sulfa Antibiotics Rash  . Sulfonamide Derivatives Itching and Rash    Past Medical History, Surgical history, Social history, and Family History were reviewed and updated.  Review of Systems: All other 10 point review of systems is negative.   Physical Exam:  vitals were not taken for this visit.   Wt Readings from Last 3 Encounters:  01/17/19 252 lb (114.3 kg)  11/17/18 256 lb (116.1 kg)  09/28/18 254 lb (115.2 kg)    Ocular: Sclerae unicteric, pupils equal, round and  reactive to light Ear-nose-throat: Oropharynx clear, dentition fair Lymphatic: No cervical or supraclavicular adenopathy Lungs no rales or rhonchi, good excursion bilaterally Heart regular rate and rhythm, no murmur appreciated Abd soft, nontender, positive bowel sounds, no liver or spleen tip palpated on exam, no fluid wave MSK no focal spinal tenderness, no joint edema Neuro: non-focal, well-oriented, appropriate affect Breasts: Deferred   Lab Results  Component Value Date   WBC 7.5 12/08/2018   HGB 11.6 (L) 12/08/2018   HCT 35.2 (L) 12/08/2018   MCV 95.7 12/08/2018  PLT 104 (L) 12/08/2018   Lab Results  Component Value Date   FERRITIN 281 10/26/2018   IRON 82 10/26/2018   TIBC 399 10/26/2018   UIBC 317 10/26/2018   IRONPCTSAT 21 10/26/2018   Lab Results  Component Value Date   RETICCTPCT 3.4 (H) 10/26/2018   RBC 3.68 (L) 12/08/2018   RETICCTABS 73.3 01/28/2011   No results found for: KPAFRELGTCHN, LAMBDASER, KAPLAMBRATIO No results found for: IGGSERUM, IGA, IGMSERUM No results found for: Kathrynn Ducking, MSPIKE, SPEI   Chemistry      Component Value Date/Time   NA 141 12/08/2018 1301   NA 145 09/08/2017 1317   NA 142 02/25/2017 1013   K 4.5 12/08/2018 1301   K 4.5 09/08/2017 1317   K 3.8 02/25/2017 1013   CL 96 (L) 12/08/2018 1301   CL 98 09/08/2017 1317   CO2 32 12/08/2018 1301   CO2 32 09/08/2017 1317   CO2 26 02/25/2017 1013   BUN 45 (H) 12/08/2018 1301   BUN 34 (H) 09/08/2017 1317   BUN 20.1 02/25/2017 1013   CREATININE 2.71 (H) 12/08/2018 1301   CREATININE 1.9 (H) 09/08/2017 1317   CREATININE 1.8 (H) 02/25/2017 1013      Component Value Date/Time   CALCIUM 10.0 12/08/2018 1301   CALCIUM 9.3 09/08/2017 1317   CALCIUM 8.7 02/25/2017 1013   ALKPHOS 32 (L) 12/08/2018 1301   ALKPHOS 45 09/08/2017 1317   ALKPHOS 65 02/25/2017 1013   AST 19 12/08/2018 1301   AST 16 02/25/2017 1013   ALT 15 12/08/2018 1301    ALT 26 09/08/2017 1317   ALT 13 02/25/2017 1013   BILITOT 0.7 12/08/2018 1301   BILITOT 0.76 02/25/2017 1013       Impression and Plan: [name] is a very pleasant 84 yo caucasian female with immune based thrombocytopenia.  She received Nplate today as planned.  We will plan to have her labs checked every 2 weeks at [facility] and Nplate injection every [redacted] weeks along with follow-up in 4 weeks.  She will contact our office with any questions or concerns. We can certainly see her sooner if need be.   Laverna Peace, NP 5/14/202010:31 AM

## 2019-01-19 NOTE — Telephone Encounter (Signed)
CBC and CMET results received via fax from [facility].  Results given to Dr. Marin Olp.  Lab appt canceled for today.  Pt to receive N-plate today per order of Dr. Marin Olp.  Results to be scanned.

## 2019-01-20 DIAGNOSIS — R609 Edema, unspecified: Secondary | ICD-10-CM | POA: Diagnosis not present

## 2019-01-20 DIAGNOSIS — I872 Venous insufficiency (chronic) (peripheral): Secondary | ICD-10-CM | POA: Diagnosis not present

## 2019-01-31 DIAGNOSIS — N183 Chronic kidney disease, stage 3 (moderate): Secondary | ICD-10-CM | POA: Diagnosis not present

## 2019-01-31 DIAGNOSIS — I872 Venous insufficiency (chronic) (peripheral): Secondary | ICD-10-CM | POA: Diagnosis not present

## 2019-02-02 ENCOUNTER — Inpatient Hospital Stay: Payer: Medicare Other

## 2019-02-03 DIAGNOSIS — I872 Venous insufficiency (chronic) (peripheral): Secondary | ICD-10-CM | POA: Diagnosis not present

## 2019-02-06 DIAGNOSIS — N200 Calculus of kidney: Secondary | ICD-10-CM | POA: Diagnosis not present

## 2019-02-06 DIAGNOSIS — I129 Hypertensive chronic kidney disease with stage 1 through stage 4 chronic kidney disease, or unspecified chronic kidney disease: Secondary | ICD-10-CM | POA: Diagnosis not present

## 2019-02-06 DIAGNOSIS — N183 Chronic kidney disease, stage 3 (moderate): Secondary | ICD-10-CM | POA: Diagnosis not present

## 2019-02-15 DIAGNOSIS — N183 Chronic kidney disease, stage 3 (moderate): Secondary | ICD-10-CM | POA: Diagnosis not present

## 2019-02-16 ENCOUNTER — Inpatient Hospital Stay: Payer: Medicare Other

## 2019-02-16 ENCOUNTER — Encounter: Payer: Self-pay | Admitting: Family

## 2019-02-16 ENCOUNTER — Other Ambulatory Visit: Payer: Self-pay

## 2019-02-16 ENCOUNTER — Inpatient Hospital Stay: Payer: Medicare Other | Attending: Hematology & Oncology | Admitting: Family

## 2019-02-16 VITALS — BP 153/58 | HR 82 | Resp 20 | Ht 61.0 in | Wt 253.1 lb

## 2019-02-16 DIAGNOSIS — Z86711 Personal history of pulmonary embolism: Secondary | ICD-10-CM | POA: Diagnosis not present

## 2019-02-16 DIAGNOSIS — D509 Iron deficiency anemia, unspecified: Secondary | ICD-10-CM | POA: Diagnosis not present

## 2019-02-16 DIAGNOSIS — D693 Immune thrombocytopenic purpura: Secondary | ICD-10-CM | POA: Insufficient documentation

## 2019-02-16 DIAGNOSIS — I4891 Unspecified atrial fibrillation: Secondary | ICD-10-CM

## 2019-02-16 MED ORDER — ROMIPLOSTIM INJECTION 500 MCG
8.0000 ug/kg | Freq: Once | SUBCUTANEOUS | Status: AC
  Administered 2019-02-16: 920 ug via SUBCUTANEOUS
  Filled 2019-02-16: qty 1.84

## 2019-02-16 NOTE — Progress Notes (Signed)
Hematology and Oncology Follow Up Visit  [name] [id number] [birth year] 84 y.o. 02/16/2019   Principle Diagnosis:  Immune based thrombocytopenia History of PE Iron deficiency anemia Atrial fib  Current Therapy:   Nplate q week for platelet count >80K IV Iron as indicated    Interim History:  [name] is here today for follow-up. She is doing well but does have some fatigue. Platelet count at this time is 65.  Hgb is stable at 9.6 and MCV 91.  She has had no issues with bleeding, no bruising or petechiae.  She has a wound care NP that comes several times a week to wrap her legs which she notes is helping.  No fever, chills, n/v, cough, rash, dizziness, chest pain, palpitations, abdominal pain or changes in bowel or bladder habits.  The neuropathy in her feet is stable and unchanged.  She denies any falls or syncopal episodes.  She is using her rolling walker when ambulating for added support.  No lymphadenopathy noted on exam.  She has maintained a good appetite and is staying well hydrated. Her weight is stable.   ECOG Performance Status: 1 - Symptomatic but completely ambulatory  Medications:  Allergies as of 02/16/2019      Reactions   Apixaban Other (See Comments)   Internal Bleeding   Aspirin Itching, Rash, Hives, Swelling   Swelling of her tongue   Mirabegron Other (See Comments)   Patient experienced A-Fib   Metformin Diarrhea, Nausea Only   Pineapple Swelling   Throat swells and blisters on tongue and roof of mouth per patient   Tetracycline Hives   Fluticasone-salmeterol Itching, Rash   Iodinated Diagnostic Agents Itching, Rash      Lactose Intolerance (gi) Other (See Comments)   gas   Latex Itching, Rash, Other (See Comments)   Pt. States latex pulls her skin off.    Lisinopril Cough   Metronidazole Other (See Comments)   Unknown   Sulfa Antibiotics Rash   Sulfonamide Derivatives Itching, Rash      Medication List       Accurate as of  February 16, 2019  1:50 PM. If you have any questions, ask your nurse or doctor.        STOP taking these medications   insulin glargine 100 UNIT/ML injection Commonly known as: Lantus Stopped by: Laverna Peace, NP     TAKE these medications   acetaminophen 325 MG tablet Commonly known as: TYLENOL Take 650 mg by mouth every 6 (six) hours as needed.   atorvastatin 40 MG tablet Commonly known as: LIPITOR Take 40 mg by mouth daily.   Banophen 25 MG tablet Generic drug: diphenhydrAMINE Take 25 mg by mouth daily.   bisacodyl 5 MG EC tablet Commonly known as: DULCOLAX Take 5 mg by mouth daily as needed for moderate constipation. As needed every three days   CALCIUM 1200 PO Take 1 tablet by mouth daily.   diltiazem 180 MG 24 hr capsule Commonly known as: CARDIZEM CD Take 1 capsule (180 mg total) by mouth daily.   docusate sodium 100 MG capsule Commonly known as: COLACE Take 1 capsule by mouth daily.   Dulaglutide 1.5 MG/0.5ML Sopn Commonly known as: Trulicity Inject 1.5 mg into the skin once a week.   fenofibrate 54 MG tablet TAKE 1 TABLET(54 MG) BY MOUTH DAILY   fexofenadine 180 MG tablet Commonly known as: ALLEGRA Take 180 mg by mouth daily.   Flaxseed Oil 1000 MG Caps Take by mouth 2 (  two) times daily.   furosemide 40 MG tablet Commonly known as: LASIX What changed: Another medication with the same name was removed. Continue taking this medication, and follow the directions you see here. Changed by: Laverna Peace, NP   glipiZIDE 10 MG tablet Commonly known as: GLUCOTROL Take 1 tablet (10 mg total) by mouth 2 (two) times daily before a meal.   HumaLOG 100 UNIT/ML injection Generic drug: insulin lispro What changed: Another medication with the same name was removed. Continue taking this medication, and follow the directions you see here. Changed by: Laverna Peace, NP   insulin aspart 100 UNIT/ML injection Commonly known as: novoLOG Inject into the  skin.   Insulin Pen Needle 31G X 5 MM Misc Commonly known as: B-D UF III MINI PEN NEEDLES Use daily for insulin injection   INSULIN SYRINGE 1CC/30GX5/16" 30G X 5/16" 1 ML Misc USE BID UTD   levothyroxine 112 MCG tablet Commonly known as: SYNTHROID TAKE 1 TABLET BY MOUTH DAILY   multivitamin tablet Take 1 tablet by mouth daily.   omeprazole 20 MG capsule Commonly known as: PRILOSEC   potassium citrate 10 MEQ (1080 MG) SR tablet Commonly known as: UROCIT-K TK 2 TABLETS PO BID WITH MEALS   PROBIOTIC PO Take 1 capsule by mouth daily.   ranitidine 150 MG tablet Commonly known as: ZANTAC What changed: Another medication with the same name was removed. Continue taking this medication, and follow the directions you see here. Changed by: Laverna Peace, NP   sertraline 100 MG tablet Commonly known as: ZOLOFT Take 1 tablet by mouth daily.   triamcinolone cream 0.1 % Commonly known as: KENALOG   vitamin C 250 MG tablet Commonly known as: ASCORBIC ACID Take 250 mg by mouth daily.   Vitamin D3 50 MCG (2000 UT) Tabs Take 2,000 Units by mouth daily.       Allergies:  Allergies  Allergen Reactions  . Apixaban Other (See Comments)    Internal Bleeding  . Aspirin Itching, Rash, Hives and Swelling    Swelling of her tongue  . Mirabegron Other (See Comments)    Patient experienced A-Fib  . Metformin Diarrhea and Nausea Only  . Pineapple Swelling    Throat swells and blisters on tongue and roof of mouth per patient  . Tetracycline Hives  . Fluticasone-Salmeterol Itching and Rash  . Iodinated Diagnostic Agents Itching and Rash       . Lactose Intolerance (Gi) Other (See Comments)    gas  . Latex Itching, Rash and Other (See Comments)    Pt. States latex pulls her skin off.   . Lisinopril Cough  . Metronidazole Other (See Comments)    Unknown   . Sulfa Antibiotics Rash  . Sulfonamide Derivatives Itching and Rash    Past Medical History, Surgical history, Social  history, and Family History were reviewed and updated.  Review of Systems: All other 10 point review of systems is negative.   Physical Exam:  height is 5\' 1"  (1.549 m) and weight is 253 lb 1.9 oz (114.8 kg). Her blood pressure is 153/58 (abnormal) and her pulse is 82. Her respiration is 20 and oxygen saturation is 96%.   Wt Readings from Last 3 Encounters:  02/16/19 253 lb 1.9 oz (114.8 kg)  01/19/19 259 lb 6.4 oz (117.7 kg)  01/17/19 252 lb (114.3 kg)    Ocular: Sclerae unicteric, pupils equal, round and reactive to light Ear-nose-throat: Oropharynx clear, dentition fair Lymphatic: No cervical or supraclavicular adenopathy Lungs  no rales or rhonchi, good excursion bilaterally Heart regular rate and rhythm, no murmur appreciated Abd soft, nontender, positive bowel sounds, no liver or spleen tip palpated on exam, no fluid wave  MSK no focal spinal tenderness, no joint edema Neuro: non-focal, well-oriented, appropriate affect Breasts: Deferred   Lab Results  Component Value Date   WBC 7.5 12/08/2018   HGB 11.6 (L) 12/08/2018   HCT 35.2 (L) 12/08/2018   MCV 95.7 12/08/2018   PLT 104 (L) 12/08/2018   Lab Results  Component Value Date   FERRITIN 281 10/26/2018   IRON 82 10/26/2018   TIBC 399 10/26/2018   UIBC 317 10/26/2018   IRONPCTSAT 21 10/26/2018   Lab Results  Component Value Date   RETICCTPCT 3.4 (H) 10/26/2018   RBC 3.68 (L) 12/08/2018   RETICCTABS 73.3 01/28/2011   No results found for: KPAFRELGTCHN, LAMBDASER, KAPLAMBRATIO No results found for: IGGSERUM, IGA, IGMSERUM No results found for: Kathrynn Ducking, MSPIKE, SPEI   Chemistry      Component Value Date/Time   NA 141 12/08/2018 1301   NA 145 09/08/2017 1317   NA 142 02/25/2017 1013   K 4.5 12/08/2018 1301   K 4.5 09/08/2017 1317   K 3.8 02/25/2017 1013   CL 96 (L) 12/08/2018 1301   CL 98 09/08/2017 1317   CO2 32 12/08/2018 1301   CO2 32 09/08/2017 1317    CO2 26 02/25/2017 1013   BUN 45 (H) 12/08/2018 1301   BUN 34 (H) 09/08/2017 1317   BUN 20.1 02/25/2017 1013   CREATININE 2.71 (H) 12/08/2018 1301   CREATININE 1.9 (H) 09/08/2017 1317   CREATININE 1.8 (H) 02/25/2017 1013      Component Value Date/Time   CALCIUM 10.0 12/08/2018 1301   CALCIUM 9.3 09/08/2017 1317   CALCIUM 8.7 02/25/2017 1013   ALKPHOS 32 (L) 12/08/2018 1301   ALKPHOS 45 09/08/2017 1317   ALKPHOS 65 02/25/2017 1013   AST 19 12/08/2018 1301   AST 16 02/25/2017 1013   ALT 15 12/08/2018 1301   ALT 26 09/08/2017 1317   ALT 13 02/25/2017 1013   BILITOT 0.7 12/08/2018 1301   BILITOT 0.76 02/25/2017 1013       Impression and Plan: Ms. Frie is a very pleasant 84 yo caucasian female with immune based thrombocytopenia.  She received Nplate today for platelet count of 65.  We will continue to have labs checked every 2 weeks at [facility] and Nplate injection. We will see her back in 4 weeks.  She will contact our office with any questions or concerns. We can certainly see her sooner if need be.    Laverna Peace, NP 6/11/20201:50 PM

## 2019-02-21 ENCOUNTER — Other Ambulatory Visit: Payer: Self-pay

## 2019-02-21 ENCOUNTER — Telehealth: Payer: Self-pay | Admitting: Hematology & Oncology

## 2019-02-21 DIAGNOSIS — I889 Nonspecific lymphadenitis, unspecified: Secondary | ICD-10-CM | POA: Diagnosis not present

## 2019-02-21 NOTE — Telephone Encounter (Signed)
Called regarding appointments added letter/calendar mailed per 6/11 los

## 2019-02-22 ENCOUNTER — Encounter: Payer: Self-pay | Admitting: Internal Medicine

## 2019-02-22 ENCOUNTER — Ambulatory Visit (INDEPENDENT_AMBULATORY_CARE_PROVIDER_SITE_OTHER): Payer: Medicare Other | Admitting: Internal Medicine

## 2019-02-22 ENCOUNTER — Other Ambulatory Visit: Payer: Self-pay

## 2019-02-22 VITALS — BP 126/80 | HR 100 | Ht 61.0 in | Wt 254.0 lb

## 2019-02-22 DIAGNOSIS — E1122 Type 2 diabetes mellitus with diabetic chronic kidney disease: Secondary | ICD-10-CM

## 2019-02-22 DIAGNOSIS — E7800 Pure hypercholesterolemia, unspecified: Secondary | ICD-10-CM

## 2019-02-22 DIAGNOSIS — N184 Chronic kidney disease, stage 4 (severe): Secondary | ICD-10-CM | POA: Diagnosis not present

## 2019-02-22 DIAGNOSIS — E890 Postprocedural hypothyroidism: Secondary | ICD-10-CM | POA: Diagnosis not present

## 2019-02-22 DIAGNOSIS — I889 Nonspecific lymphadenitis, unspecified: Secondary | ICD-10-CM | POA: Diagnosis not present

## 2019-02-22 DIAGNOSIS — Z794 Long term (current) use of insulin: Secondary | ICD-10-CM

## 2019-02-22 LAB — TSH: TSH: 1.64 u[IU]/mL (ref 0.35–4.50)

## 2019-02-22 LAB — POCT GLYCOSYLATED HEMOGLOBIN (HGB A1C): Hemoglobin A1C: 6.3 % — AB (ref 4.0–5.6)

## 2019-02-22 LAB — T4, FREE: Free T4: 0.65 ng/dL (ref 0.60–1.60)

## 2019-02-22 MED ORDER — INSULIN GLARGINE 100 UNIT/ML ~~LOC~~ SOLN: 40.0000 [IU] | Freq: Every day | SUBCUTANEOUS | 3 refills | Status: DC

## 2019-02-22 NOTE — Progress Notes (Signed)
Patient ID: [name], female   DOB: 1934-11-20, 84 y.o.   MRN: [id number]   HPI: [name] is a 84 y.o.-year-old female, returning for f/u for DM2, dx in late 1990s, insulin-dependent since 2012, uncontrolled, with complications (CKD stage 4, CHF, + DR) and hypothyroidism. She prev. Saw Dr. Chalmers Cater. Last visit with me 2.5 mo ago.  She is here with offers part of the history especially related to her history of medication doses.  At this visit, she complains of swelling in her neck and points towards her submental area.  No problems swallowing, no choking, no neck pressure.  She wants to make sure that this is not caused by her thyroid.  She still resides in [place].  She loves it there.   DM2:  Last hemoglobin A1c was: 12/11/2018: HbA1c 6.9% Lab Results  Component Value Date   HGBA1C 7.3 (A) 09/08/2018   HGBA1C 7.4 (A) 05/19/2018   HGBA1C 8.3 (A) 03/22/2018  08/19/2017: HbA1c calculated from fructosamine is 6.7%  She is on: >> stopped since last OV - Trulicity 1.5 mg weekly - started 09/2018 - Lantus 80 units at bedtime - Humalog: 35 units before breakfast 30 units before lunch 35 >> 25 units before dinner  Pt checks her sugars 4x a day with her Freestyle Libre CGM:  Freestyle libre CGM parameters: - Average: 177 - % active CGM time:  92% of the time - Glucose variability 16.4% (target < or = to 36%) - time in range:  - very low (<54): 0% - low (54-69): 0% - normal range (70-180): 52% - high sugars (181-250): 47% - very high sugars (>250): 1%  Glucose pattern:   Lowest sugar was 295 >> ...77 >> 88; she has hypoglycemia awareness at 100. Highest sugar was 559 >> .Marland Kitchen.203 >> 258.  Glucometer: True Metrix air >> Freestyle Libre CGM  Pt's meals are: - Breakfast: grits, bacon, green tea + stevia >> eggs + bacon + toast/grits + fruit - Lunch: 1/2 sandwich + salad + chips and drink >> 1/2 sandwich - snack: fruit - Dinner:meat + veggies + occas.  Starch >> meat + veggies - Snacks: 2   -+ Stage IV CKD, last BUN/creatinine:  Lab Results  Component Value Date   BUN 45 (H) 12/08/2018   BUN 42 (H) 11/17/2018   CREATININE 2.71 (H) 12/08/2018   CREATININE 2.30 (H) 11/17/2018  03/15/2018: 47/2.34, GFR 19, Glu 283  + Low GFR: Lab Results  Component Value Date   GFRNONAA 16 (L) 12/08/2018   GFRNONAA 19 (L) 11/17/2018   GFRNONAA 17 (L) 10/26/2018   GFRNONAA 20 (L) 10/19/2018   GFRNONAA 19 (L) 10/12/2018   GFRNONAA 20 (L) 10/04/2018   GFRNONAA 18 (L) 09/28/2018   GFRNONAA 20 (L) 08/26/2018   GFRNONAA 20 (L) 08/15/2018   GFRNONAA 18 (L) 07/22/2018  He has a h/o uric acid kidney stones.  -+ HL; last set of lipids: Lab Results  Component Value Date   CHOL 131 05/19/2018   HDL 26.70 (L) 05/19/2018   LDLCALC 65 08/16/2009   LDLDIRECT 41.0 05/19/2018   TRIG (H) 05/19/2018    656.0 Triglyceride is over 400; calculations on Lipids are invalid.   CHOLHDL 5 05/19/2018  On Lipitor 40, fenofibrate 45, flaxseed oil 1000 mg twice a day - last eye exam was in 05/2018: + DR-improved -+  Numbness and tingling in her toes  She had a skin ulcer-managed by wound care.  She has a h/o urinary  incontinence >> started Myrbetriq >> developed A fib >> started Eliquis >> had bleeding >> received blood transfusion.  Postsurgical (1960s) and postablative (1977 and 1999) hypothyroidism   Pt is on levothyroxine 112 mcg daily, taken: - in am - fasting - at least 30 min from b'fast - no Ca, Fe, MVI, PPIs - + Zantac later in the day - not on Biotin  Last TSH was normal: Lab Results  Component Value Date   TSH 0.92 05/19/2018   She had a mild MI at the beginning of 2019 and also CHF.   She has immune Tc-penia and anemia.  ROS: Constitutional: no weight gain/+ weight loss, no fatigue, no subjective hyperthermia, no subjective hypothermia Eyes: no blurry vision, no xerophthalmia ENT: no sore throat, no nodules palpated in neck, no  dysphagia, no odynophagia, no hoarseness Cardiovascular: no CP/no SOB/no palpitations/no leg swelling Respiratory: no cough/no SOB/no wheezing Gastrointestinal: no N/no V/no D/no C/no acid reflux Musculoskeletal: no muscle aches/no joint aches Skin: no rashes, no hair loss Neurological: no tremors/+ numbness/+ tingling/no dizziness  I reviewed pt's medications, allergies, PMH, social hx, family hx, and changes were documented in the history of present illness. Otherwise, unchanged from my initial visit note.  Past Medical History:  Diagnosis Date  . Allergic rhinitis   . Anxiety state, unspecified    panic attacks  . CHF (congestive heart failure) ([place])   . Depressive disorder, not elsewhere classified   . Extrinsic asthma, unspecified    no problem since adulthood  . Obesity   . OSA on CPAP    severe  . Pure hypercholesterolemia   . Respiratory failure with hypoxia ([place]) 09/2008   acute, secondary to multiple bilateral pulmonary embolism , negative hypercoagulable workup 09/2008 hospital stay  . Scoliosis   . Syncope and collapse   . Type II or unspecified type diabetes mellitus without mention of complication, not stated as uncontrolled   . Unspecified essential hypertension   . Unspecified hypothyroidism    hypo   Past Surgical History:  Procedure Laterality Date  . EYE SURGERY    . JOINT REPLACEMENT    . knee replaced    . THYROID SURGERY     Social History   Social History  . Marital status: Widowed    Spouse name: N/A  . Number of children: 2   Occupational History  . 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 Smoking status: Former Smoker    Years: 20.00    Quit date: 09/07/1989  . Smokeless tobacco: Never Used  . Alcohol use No  . Drug use: No   Current Outpatient Medications on File Prior to Visit  Medication Sig Dispense Refill  . acetaminophen (TYLENOL) 325 MG tablet Take 650 mg by mouth every 6 (six)  hours as needed.    Marland Kitchen atorvastatin (LIPITOR) 40 MG tablet Take 40 mg by mouth daily.    . bisacodyl (DULCOLAX) 5 MG EC tablet Take 5 mg by mouth daily as needed for moderate constipation. As needed every three days    . Calcium Carbonate-Vit D-Min (CALCIUM 1200 PO) Take 1 tablet by mouth daily.    . Cholecalciferol (VITAMIN D3) 2000 units TABS Take 2,000 Units by mouth daily.    Marland Kitchen diltiazem (CARDIZEM CD) 180 MG 24 hr capsule Take 1 capsule (180 mg total) by mouth daily. 90 capsule 3  . diphenhydrAMINE (BANOPHEN) 25 MG tablet Take 25 mg by mouth  daily.    . docusate sodium (COLACE) 100 MG capsule Take 1 capsule by mouth daily.    . Dulaglutide (TRULICITY) 1.5 BC/4.8GQ SOPN Inject 1.5 mg into the skin once a week. 4 pen 2  . fenofibrate 54 MG tablet TAKE 1 TABLET(54 MG) BY MOUTH DAILY 90 tablet 1  . fexofenadine (ALLEGRA) 180 MG tablet Take 180 mg by mouth daily.      . Flaxseed, Linseed, (FLAXSEED OIL) 1000 MG CAPS Take by mouth 2 (two) times daily.    . furosemide (LASIX) 40 MG tablet     . glipiZIDE (GLUCOTROL) 10 MG tablet Take 1 tablet (10 mg total) by mouth 2 (two) times daily before a meal. 180 tablet 1  . HUMALOG 100 UNIT/ML injection     . insulin aspart (NOVOLOG) 100 UNIT/ML injection Inject into the skin.    . Insulin Pen Needle (B-D UF III MINI PEN NEEDLES) 31G X 5 MM MISC Use daily for insulin injection 100 each 2  . Insulin Syringe-Needle U-100 (INSULIN SYRINGE 1CC/30GX5/16") 30G X 5/16" 1 ML MISC USE BID UTD  11  . levothyroxine (SYNTHROID, LEVOTHROID) 112 MCG tablet TAKE 1 TABLET BY MOUTH DAILY 90 tablet 0  . Multiple Vitamin (MULTIVITAMIN) tablet Take 1 tablet by mouth daily.      Marland Kitchen omeprazole (PRILOSEC) 20 MG capsule     . potassium citrate (UROCIT-K) 10 MEQ (1080 MG) SR tablet TK 2 TABLETS PO BID WITH MEALS  4  . Probiotic Product (PROBIOTIC PO) Take 1 capsule by mouth daily.    . ranitidine (ZANTAC) 150 MG tablet     . sertraline (ZOLOFT) 100 MG tablet Take 1 tablet by mouth  daily.  4  . triamcinolone cream (KENALOG) 0.1 %     . vitamin C (ASCORBIC ACID) 250 MG tablet Take 250 mg by mouth daily.     No current facility-administered medications on file prior to visit.    Allergies  Allergen Reactions  . Apixaban Other (See Comments)    Internal Bleeding  . Aspirin Itching, Rash, Hives and Swelling    Swelling of her tongue  . Mirabegron Other (See Comments)    Patient experienced A-Fib  . Metformin Diarrhea and Nausea Only  . Pineapple Swelling    Throat swells and blisters on tongue and roof of mouth per patient  . Tetracycline Hives  . Fluticasone-Salmeterol Itching and Rash  . Iodinated Diagnostic Agents Itching and Rash       . Lactose Intolerance (Gi) Other (See Comments)    gas  . Latex Itching, Rash and Other (See Comments)    Pt. States latex pulls her skin off.   . Lisinopril Cough  . Metronidazole Other (See Comments)    Unknown   . Sulfa Antibiotics Rash  . Sulfonamide Derivatives Itching and Rash   Family History  Problem Relation Age of Onset  . Allergies Mother   . Clotting disorder Mother   . Osteoarthritis Mother   . Asthma Mother   . Arthritis Other   . Diabetes Other   . Hyperlipidemia Other   . Hypertension Other   . Coronary artery disease Other   . Stroke Other   . Osteoarthritis Daughter   . Rheum arthritis Maternal Grandmother   . Clotting disorder Maternal Grandmother   . Clotting disorder Maternal Uncle   . Clotting disorder Daughter   . Allergies Daughter   . Breast cancer Neg Hx    PE: BP 126/80   Pulse 100  Ht 5\' 1"  (1.549 m)   Wt 254 lb (115.2 kg)   LMP  (LMP Unknown)   SpO2 94%   BMI 47.99 kg/m  Wt Readings from Last 3 Encounters:  02/22/19 254 lb (115.2 kg)  02/16/19 253 lb 1.9 oz (114.8 kg)  01/19/19 259 lb 6.4 oz (117.7 kg)   Constitutional: overweight, in NAD, walks with a walker Eyes: PERRLA, EOMI, no exophthalmos ENT: moist mucous membranes, no thyromegaly, no cervical  lymphadenopathy Cardiovascular: tachycardia, irregularly irregular rhythm, No RG, +1/6 SEM, + B LE pitting edema Respiratory: CTA B Gastrointestinal: abdomen soft, NT, ND, BS+ Musculoskeletal: no deformities, strength intact in all 4 Skin: moist, warm, no rashes Neurological: no tremor with outstretched hands, DTR normal in all 4  ASSESSMENT: 1. DM2, insulin-dependent, uncontrolled, with complications - CKD stage 4 - CHF - + DR  2. Hypothyroidism  3. Hyperlipidemia  4.  Neck swelling  PLAN:  1. Patient with longstanding, uncontrolled, type 2 diabetes, insulin-dependent, previously on premixed insulin in his diet, now on a basal-bolus insulin regimen and also glipizide and GLP-1 receptor agonist.  She also continues on the freestyle libre CGM, which she feels helped.  At last visit, sugars were better but still high in the morning and decreasing throughout the day with sugars in the 70s at that time.  We stopped the p.m. dose of glipizide, which was given earlier, around 4 PM, not quite before dinner.  We also reduced her dinnertime Humalog to avoid hypoglycemia overnight. -At this visit, reviewing her freestyle libre CGM downloads, her sugars are fluctuating, but in a more narrow range compared to the past.  She tells me that at the facility she is not given the Humalog insulin before a meal if her sugars are lower than 120s.  We discussed that it is necessary for her to receive the entire dose, however, if her sugars are lower than 90, I gave her new instructions for the facility including when to decrease the dose to 50% and went to actually hold Humalog with meals.  Reviewing her CGM tracings, she does not have low blood sugars, and in fact her average is 177 with a small standard deviation so I do not feel that the decrease in insulin dose is necessary for now.  I did suggest to split the Lantus into doses for improved absorption at her insulin sites and better CBG profiles. -Her glucose  management indicator (estimated HbA1c from the CBG profile) is 7.5%, however, the directly measured HbA1c is 6.3%, which I feel is not very accurate for her. - I suggested to:  Patient Instructions  Please split: - Lantus 40 units 2x a day: before b'fast and at bedtime  Continue: - Trulicity 1.5 mg weekly - Humalog  35 units before breakfast 30 units before lunch 25 units before dinner If sugars >90, give the full dose for a regular meal If sugars 60-90, give  17 units before breakfast 15 units before lunch 12 units before dinner If sugars <60, hold Humalog with the meal  Please continue Levothyroxine 112 mcg daily.  Take the thyroid hormone every day, with water, at least 30 minutes before breakfast, separated by at least 4 hours from: - acid reflux medications - calcium - iron - multivitamins  Please stop at the lab.  Please return in 3 months with your sugar log.  - continue checking sugars at different times of the day - check 4x a day, rotating checks - advised for yearly eye exams >>  she is UTD - Return to clinic in 3 mo with sugar log       2. HL - Reviewed latest lipid panel from 05/2018: Triglycerides very high, LDL at goal, HDL low Lab Results  Component Value Date   CHOL 131 05/19/2018   HDL 26.70 (L) 05/19/2018   LDLCALC 65 08/16/2009   LDLDIRECT 41.0 05/19/2018   TRIG (H) 05/19/2018    656.0 Triglyceride is over 400; calculations on Lipids are invalid.   CHOLHDL 5 05/19/2018  - Continues statin, fenofibrate, the omega-3 fatty acids without side effects  3.  Hypothyroidism  - latest thyroid labs reviewed with pt >> normal 05/2018 - she continues on LT4 112 mcg daily - pt feels good on this dose. - we discussed about taking the thyroid hormone every day, with water, >30 minutes before breakfast, separated by >4 hours from acid reflux medications, calcium, iron, multivitamins. Pt. is taking it correctly. - will check thyroid tests today: TSH and fT4 -  If labs are abnormal, she will need to return for repeat TFTs in 1.5 months  4. Neck swelling -She does appear to have swelling in the submental area and this may be due to enlarged lymph nodes.  She did have a right ear inflammation/pressure which has resolved and the swelling started afterwards. -She is seen the facility Dr. and she will get antibiotics. -She may need imaging if swelling does not decrease. -For now, I have reassured her that the swelling is not related to the thyroid.  - time spent with the patient and her daughter: 40 minutes, of which >50% was spent in obtaining information about her symptoms, reviewing her CGM trial doses, previous labs, evaluations, and treatments, counseling her about her conditions (please see the discussed topics above), and developing a plan to further investigate and treat them; she and her daughter had a number of questions which I addressed.  Philemon Kingdom, MD PhD [facility]

## 2019-02-22 NOTE — Patient Instructions (Addendum)
Please split: - Lantus 40 units 2x a day: before b'fast and at bedtime  Continue: - Trulicity 1.5 mg weekly - Humalog  35 units before breakfast 30 units before lunch 25 units before dinner If sugars >90, give the full dose for a regular meal If sugars 60-90, give  17 units before breakfast 15 units before lunch 12 units before dinner If sugars <60, hold Humalog with the meal  Please continue Levothyroxine 112 mcg daily.  Take the thyroid hormone every day, with water, at least 30 minutes before breakfast, separated by at least 4 hours from: - acid reflux medications - calcium - iron - multivitamins  Please stop at the lab.  Please return in 3 months with your sugar log.

## 2019-02-23 DIAGNOSIS — I889 Nonspecific lymphadenitis, unspecified: Secondary | ICD-10-CM | POA: Diagnosis not present

## 2019-03-01 DIAGNOSIS — N183 Chronic kidney disease, stage 3 (moderate): Secondary | ICD-10-CM | POA: Diagnosis not present

## 2019-03-03 ENCOUNTER — Inpatient Hospital Stay: Payer: Medicare Other

## 2019-03-03 ENCOUNTER — Other Ambulatory Visit: Payer: Self-pay

## 2019-03-03 DIAGNOSIS — D693 Immune thrombocytopenic purpura: Secondary | ICD-10-CM | POA: Diagnosis not present

## 2019-03-03 DIAGNOSIS — I4891 Unspecified atrial fibrillation: Secondary | ICD-10-CM | POA: Diagnosis not present

## 2019-03-03 DIAGNOSIS — D509 Iron deficiency anemia, unspecified: Secondary | ICD-10-CM | POA: Diagnosis not present

## 2019-03-03 DIAGNOSIS — Z86711 Personal history of pulmonary embolism: Secondary | ICD-10-CM | POA: Diagnosis not present

## 2019-03-03 MED ORDER — ROMIPLOSTIM INJECTION 500 MCG
8.0000 ug/kg | Freq: Once | SUBCUTANEOUS | Status: AC
  Administered 2019-03-03: 920 ug via SUBCUTANEOUS
  Filled 2019-03-03: qty 1

## 2019-03-03 NOTE — Patient Instructions (Signed)
Romiplostim injection What is this medicine? ROMIPLOSTIM (roe mi PLOE stim) helps your body make more platelets. This medicine is used to treat low platelets caused by chronic idiopathic thrombocytopenic purpura (ITP). This medicine may be used for other purposes; ask your health care provider or pharmacist if you have questions. COMMON BRAND NAME(S): Nplate What should I tell my health care provider before I take this medicine? They need to know if you have any of these conditions: -bleeding disorders -bone marrow problem, like blood cancer or myelodysplastic syndrome -history of blood clots -liver disease -surgery to remove your spleen -an unusual or allergic reaction to romiplostim, mannitol, other medicines, foods, dyes, or preservatives -pregnant or trying to get pregnant -breast-feeding How should I use this medicine? This medicine is for injection under the skin. It is given by a health care professional in a hospital or clinic setting. A special MedGuide will be given to you before your injection. Read this information carefully each time. Talk to your pediatrician regarding the use of this medicine in children. While this drug may be prescribed for children as young as 1 year for selected conditions, precautions do apply. Overdosage: If you think you have taken too much of this medicine contact a poison control center or emergency room at once. NOTE: This medicine is only for you. Do not share this medicine with others. What if I miss a dose? It is important not to miss your dose. Call your doctor or health care professional if you are unable to keep an appointment. What may interact with this medicine? Interactions are not expected. This list may not describe all possible interactions. Give your health care provider a list of all the medicines, herbs, non-prescription drugs, or dietary supplements you use. Also tell them if you smoke, drink alcohol, or use illegal drugs. Some items  may interact with your medicine. What should I watch for while using this medicine? Your condition will be monitored carefully while you are receiving this medicine. Visit your prescriber or health care professional for regular checks on your progress and for the needed blood tests. It is important to keep all appointments. What side effects may I notice from receiving this medicine? Side effects that you should report to your doctor or health care professional as soon as possible: -allergic reactions like skin rash, itching or hives, swelling of the face, lips, or tongue -signs and symptoms of bleeding such as bloody or black, tarry stools; red or dark brown urine; spitting up blood or brown material that looks like coffee grounds; red spots on the skin; unusual bruising or bleeding from the eyes, gums, or nose -signs and symptoms of a blood clot such as chest pain; shortness of breath; pain, swelling, or warmth in the leg -signs and symptoms of a stroke like changes in vision; confusion; trouble speaking or understanding; severe headaches; sudden numbness or weakness of the face, arm or leg; trouble walking; dizziness; loss of balance or coordination Side effects that usually do not require medical attention (report to your doctor or health care professional if they continue or are bothersome): -headache -pain in arms and legs -pain in mouth -stomach pain This list may not describe all possible side effects. Call your doctor for medical advice about side effects. You may report side effects to FDA at 1-800-FDA-1088. Where should I keep my medicine? This drug is given in a hospital or clinic and will not be stored at home. NOTE: This sheet is a summary. It may not   cover all possible information. If you have questions about this medicine, talk to your doctor, pharmacist, or health care provider.  2019 Elsevier/Gold Standard (2017-08-23 11:10:55)  

## 2019-03-03 NOTE — Progress Notes (Signed)
PLTC = 149. Will give NPlate today per Dr. Marin Olp.

## 2019-03-16 ENCOUNTER — Encounter: Payer: Self-pay | Admitting: Hematology & Oncology

## 2019-03-16 ENCOUNTER — Inpatient Hospital Stay: Payer: Medicare Other

## 2019-03-16 ENCOUNTER — Inpatient Hospital Stay (HOSPITAL_BASED_OUTPATIENT_CLINIC_OR_DEPARTMENT_OTHER): Payer: Medicare Other | Admitting: Hematology & Oncology

## 2019-03-16 ENCOUNTER — Other Ambulatory Visit: Payer: Self-pay

## 2019-03-16 ENCOUNTER — Inpatient Hospital Stay: Payer: Medicare Other | Attending: Hematology & Oncology

## 2019-03-16 VITALS — BP 146/73 | HR 120 | Temp 98.6°F | Resp 19 | Wt 254.0 lb

## 2019-03-16 DIAGNOSIS — Z79899 Other long term (current) drug therapy: Secondary | ICD-10-CM | POA: Diagnosis not present

## 2019-03-16 DIAGNOSIS — D693 Immune thrombocytopenic purpura: Secondary | ICD-10-CM

## 2019-03-16 DIAGNOSIS — D509 Iron deficiency anemia, unspecified: Secondary | ICD-10-CM

## 2019-03-16 DIAGNOSIS — I4891 Unspecified atrial fibrillation: Secondary | ICD-10-CM

## 2019-03-16 DIAGNOSIS — Z86711 Personal history of pulmonary embolism: Secondary | ICD-10-CM | POA: Diagnosis not present

## 2019-03-16 DIAGNOSIS — Z7901 Long term (current) use of anticoagulants: Secondary | ICD-10-CM | POA: Insufficient documentation

## 2019-03-16 LAB — CMP (CANCER CENTER ONLY)
ALT: 19 U/L (ref 0–44)
AST: 24 U/L (ref 15–41)
Albumin: 4.4 g/dL (ref 3.5–5.0)
Alkaline Phosphatase: 43 U/L (ref 38–126)
Anion gap: 10 (ref 5–15)
BUN: 31 mg/dL — ABNORMAL HIGH (ref 8–23)
CO2: 32 mmol/L (ref 22–32)
Calcium: 8.9 mg/dL (ref 8.9–10.3)
Chloride: 100 mmol/L (ref 98–111)
Creatinine: 2.14 mg/dL — ABNORMAL HIGH (ref 0.44–1.00)
GFR, Est AFR Am: 24 mL/min — ABNORMAL LOW (ref >60)
GFR, Estimated: 21 mL/min — ABNORMAL LOW (ref >60)
Glucose, Bld: 180 mg/dL — ABNORMAL HIGH (ref 70–99)
Potassium: 4.2 mmol/L (ref 3.5–5.1)
Sodium: 142 mmol/L (ref 135–145)
Total Bilirubin: 0.6 mg/dL (ref 0.3–1.2)
Total Protein: 7.2 g/dL (ref 6.5–8.1)

## 2019-03-16 LAB — SAVE SMEAR(SSMR), FOR PROVIDER SLIDE REVIEW

## 2019-03-16 LAB — CBC WITH DIFFERENTIAL (CANCER CENTER ONLY)
Abs Immature Granulocytes: 0.05 10*3/uL (ref 0.00–0.07)
Basophils Absolute: 0 10*3/uL (ref 0.0–0.1)
Basophils Relative: 0 %
Eosinophils Absolute: 0.2 10*3/uL (ref 0.0–0.5)
Eosinophils Relative: 2 %
HCT: 32.7 % — ABNORMAL LOW (ref 36.0–46.0)
Hemoglobin: 10.4 g/dL — ABNORMAL LOW (ref 12.0–15.0)
Immature Granulocytes: 1 %
Lymphocytes Relative: 14 %
Lymphs Abs: 1 10*3/uL (ref 0.7–4.0)
MCH: 30 pg (ref 26.0–34.0)
MCHC: 31.8 g/dL (ref 30.0–36.0)
MCV: 94.2 fL (ref 80.0–100.0)
Monocytes Absolute: 0.5 10*3/uL (ref 0.1–1.0)
Monocytes Relative: 7 %
Neutro Abs: 5.8 10*3/uL (ref 1.7–7.7)
Neutrophils Relative %: 76 %
Platelet Count: 183 10*3/uL (ref 150–400)
RBC: 3.47 MIL/uL — ABNORMAL LOW (ref 3.87–5.11)
RDW: 18.4 % — ABNORMAL HIGH (ref 11.5–15.5)
WBC Count: 7.6 10*3/uL (ref 4.0–10.5)
nRBC: 0 % (ref 0.0–0.2)

## 2019-03-16 LAB — PLATELET BY CITRATE

## 2019-03-16 MED ORDER — ROMIPLOSTIM INJECTION 500 MCG
8.0000 ug/kg | Freq: Once | SUBCUTANEOUS | Status: AC
  Administered 2019-03-16: 920 ug via SUBCUTANEOUS
  Filled 2019-03-16: qty 1.84

## 2019-03-16 NOTE — Progress Notes (Signed)
Hematology and Oncology Follow Up Visit  [name] [id number] March 26, 1935 84 y.o. 03/16/2019   Principle Diagnosis:  Immune based thrombocytopenia History of PE Iron deficiency anemia Atrial fib  Current Therapy:   Nplate q week for platelet count < 100K IV Iron as indicated    Interim History:  [name] is here today for follow-up.  Her platelet count is doing very well.  If we can just keep her on schedule, I know that we can keep her platelet count up.  Her problem I think is the rapid atrial fibrillation.  Today, her heart rate was over 120.  She clearly was in atrial fibrillation.  I feel bad that she is at a significant risk for stroke.  She is not on any type of anticoagulation.  In the past, because of her thrombocytopenia, cardiology was worried about bleeding with anticoagulation.  I think that with weekly Nplate, we can keep her platelet count up so that she can be on anticoagulation and maybe even try to be cardioverted.  She does feel quite fatigued when she does anything.  Again I am sure this is secondary to the atrial fibrillation.  She has had no obvious bleeding.  She is at assisted living.  There is still in lockdown.  She has had good appetite.  She has had no fever.  She has had no leg swelling.  Overall, her performance status is ECOG 1.  Medications:  Allergies as of 03/16/2019      Reactions   Apixaban Other (See Comments)   Internal Bleeding   Aspirin Itching, Rash, Hives, Swelling   Swelling of her tongue   Mirabegron Other (See Comments)   Patient experienced A-Fib   Metformin Diarrhea, Nausea Only   Pineapple Swelling   Throat swells and blisters on tongue and roof of mouth per patient   Tetracycline Hives   Fluticasone-salmeterol Itching, Rash   Iodinated Diagnostic Agents Itching, Rash      Lactose Intolerance (gi) Other (See Comments)   gas   Latex Itching, Rash, Other (See Comments)   Pt. States latex pulls her skin off.    Lisinopril Cough   Metronidazole Other (See Comments)   Unknown   Sulfa Antibiotics Rash   Sulfonamide Derivatives Itching, Rash      Medication List       Accurate as of March 16, 2019  1:03 PM. If you have any questions, ask your nurse or doctor.        acetaminophen 325 MG tablet Commonly known as: TYLENOL Take 650 mg by mouth every 6 (six) hours as needed.   atorvastatin 40 MG tablet Commonly known as: LIPITOR Take 40 mg by mouth daily.   Banophen 25 MG tablet Generic drug: diphenhydrAMINE Take 25 mg by mouth daily.   bisacodyl 5 MG EC tablet Commonly known as: DULCOLAX Take 5 mg by mouth daily as needed for moderate constipation. As needed every three days   CALCIUM 1200 PO Take 1 tablet by mouth daily.   diltiazem 180 MG 24 hr capsule Commonly known as: CARDIZEM CD Take 1 capsule (180 mg total) by mouth daily.   docusate sodium 100 MG capsule Commonly known as: COLACE Take 1 capsule by mouth daily.   Dulaglutide 1.5 MG/0.5ML Sopn Commonly known as: Trulicity Inject 1.5 mg into the skin once a week.   fenofibrate 54 MG tablet TAKE 1 TABLET(54 MG) BY MOUTH DAILY   fexofenadine 180 MG tablet Commonly known as: ALLEGRA Take 180 mg  by mouth daily.   Flaxseed Oil 1000 MG Caps Take by mouth 2 (two) times daily.   furosemide 40 MG tablet Commonly known as: LASIX   glipiZIDE 10 MG tablet Commonly known as: GLUCOTROL Take 1 tablet (10 mg total) by mouth 2 (two) times daily before a meal.   HumaLOG 100 UNIT/ML injection Generic drug: insulin lispro   insulin glargine 100 UNIT/ML injection Commonly known as: Lantus Inject 0.4 mLs (40 Units total) into the skin daily.   Insulin Pen Needle 31G X 5 MM Misc Commonly known as: B-D UF III MINI PEN NEEDLES Use daily for insulin injection   INSULIN SYRINGE 1CC/30GX5/16" 30G X 5/16" 1 ML Misc USE BID UTD   levothyroxine 112 MCG tablet Commonly known as: SYNTHROID TAKE 1 TABLET BY MOUTH DAILY    multivitamin tablet Take 1 tablet by mouth daily.   omeprazole 20 MG capsule Commonly known as: PRILOSEC   potassium citrate 10 MEQ (1080 MG) SR tablet Commonly known as: UROCIT-K TK 2 TABLETS PO BID WITH MEALS   PROBIOTIC PO Take 1 capsule by mouth daily.   ranitidine 150 MG tablet Commonly known as: ZANTAC   sertraline 100 MG tablet Commonly known as: ZOLOFT Take 1 tablet by mouth daily.   triamcinolone cream 0.1 % Commonly known as: KENALOG   vitamin C 250 MG tablet Commonly known as: ASCORBIC ACID Take 250 mg by mouth daily.   Vitamin D3 50 MCG (2000 UT) Tabs Take 2,000 Units by mouth daily.       Allergies:  Allergies  Allergen Reactions  . Apixaban Other (See Comments)    Internal Bleeding  . Aspirin Itching, Rash, Hives and Swelling    Swelling of her tongue  . Mirabegron Other (See Comments)    Patient experienced A-Fib  . Metformin Diarrhea and Nausea Only  . Pineapple Swelling    Throat swells and blisters on tongue and roof of mouth per patient  . Tetracycline Hives  . Fluticasone-Salmeterol Itching and Rash  . Iodinated Diagnostic Agents Itching and Rash       . Lactose Intolerance (Gi) Other (See Comments)    gas  . Latex Itching, Rash and Other (See Comments)    Pt. States latex pulls her skin off.   . Lisinopril Cough  . Metronidazole Other (See Comments)    Unknown   . Sulfa Antibiotics Rash  . Sulfonamide Derivatives Itching and Rash    Past Medical History, Surgical history, Social history, and Family History were reviewed and updated.  Review of Systems: Review of Systems  Constitutional: Negative.   HENT: Negative.   Eyes: Negative.   Respiratory: Positive for shortness of breath.   Cardiovascular: Positive for palpitations.  Gastrointestinal: Negative.   Genitourinary: Negative.   Musculoskeletal: Negative.   Skin: Negative.   Neurological: Positive for dizziness.  Endo/Heme/Allergies: Negative.    Psychiatric/Behavioral: Negative.       Physical Exam:  weight is 254 lb (115.2 kg). Her oral temperature is 98.6 F (37 C). Her blood pressure is 146/73 (abnormal) and her pulse is 120 (abnormal). Her respiration is 19 and oxygen saturation is 93%.   Wt Readings from Last 3 Encounters:  03/16/19 254 lb (115.2 kg)  02/22/19 254 lb (115.2 kg)  02/16/19 253 lb 1.9 oz (114.8 kg)    Physical Exam Vitals signs reviewed.  HENT:     Head: Normocephalic and atraumatic.  Eyes:     Pupils: Pupils are equal, round, and reactive to light.  Neck:     Musculoskeletal: Normal range of motion.  Cardiovascular:     Rate and Rhythm: Normal rate and regular rhythm.     Heart sounds: Normal heart sounds.     Comments: Heart rate is rapid and irregular.  This is consistent with atrial fibrillation.  I hear no murmurs. Pulmonary:     Effort: Pulmonary effort is normal.     Breath sounds: Normal breath sounds.     Comments: Lungs sound relatively clear bilaterally.  She may have some occasional wheezing. Abdominal:     General: Bowel sounds are normal.     Palpations: Abdomen is soft.  Musculoskeletal: Normal range of motion.        General: No tenderness or deformity.  Lymphadenopathy:     Cervical: No cervical adenopathy.  Skin:    General: Skin is warm and dry.     Findings: No erythema or rash.  Neurological:     Mental Status: She is alert and oriented to person, place, and time.  Psychiatric:        Behavior: Behavior normal.        Thought Content: Thought content normal.        Judgment: Judgment normal.      Lab Results  Component Value Date   WBC 7.6 03/16/2019   HGB 10.4 (L) 03/16/2019   HCT 32.7 (L) 03/16/2019   MCV 94.2 03/16/2019   PLT 183 03/16/2019   Lab Results  Component Value Date   FERRITIN 281 10/26/2018   IRON 82 10/26/2018   TIBC 399 10/26/2018   UIBC 317 10/26/2018   IRONPCTSAT 21 10/26/2018   Lab Results  Component Value Date   RETICCTPCT 3.4  (H) 10/26/2018   RBC 3.47 (L) 03/16/2019   RETICCTABS 73.3 01/28/2011   No results found for: KPAFRELGTCHN, LAMBDASER, KAPLAMBRATIO No results found for: IGGSERUM, IGA, IGMSERUM No results found for: Odetta Pink, SPEI   Chemistry      Component Value Date/Time   NA 142 03/16/2019 1107   NA 145 09/08/2017 1317   NA 142 02/25/2017 1013   K 4.2 03/16/2019 1107   K 4.5 09/08/2017 1317   K 3.8 02/25/2017 1013   CL 100 03/16/2019 1107   CL 98 09/08/2017 1317   CO2 32 03/16/2019 1107   CO2 32 09/08/2017 1317   CO2 26 02/25/2017 1013   BUN 31 (H) 03/16/2019 1107   BUN 34 (H) 09/08/2017 1317   BUN 20.1 02/25/2017 1013   CREATININE 2.14 (H) 03/16/2019 1107   CREATININE 1.9 (H) 09/08/2017 1317   CREATININE 1.8 (H) 02/25/2017 1013      Component Value Date/Time   CALCIUM 8.9 03/16/2019 1107   CALCIUM 9.3 09/08/2017 1317   CALCIUM 8.7 02/25/2017 1013   ALKPHOS 43 03/16/2019 1107   ALKPHOS 45 09/08/2017 1317   ALKPHOS 65 02/25/2017 1013   AST 24 03/16/2019 1107   AST 16 02/25/2017 1013   ALT 19 03/16/2019 1107   ALT 26 09/08/2017 1317   ALT 13 02/25/2017 1013   BILITOT 0.6 03/16/2019 1107   BILITOT 0.76 02/25/2017 1013       Impression and Plan: [name] is a very pleasant 84 yo caucasian female with immune based thrombocytopenia.   Again, she is responding to Nplate.  I think we does have to be aggressive with the endplate and make sure she gets this weekly.  I will talk to her cardiologist.  I will  see what they think about getting her on anticoagulation and maybe try to cardiovert her since her atrial fibrillation is more of a problem right now.  We will have her come back weekly for her Nplate.  We will check her CBC.  Again, I think we can keep her platelet count up so that she can be anticoagulated if that is what cardiology feels can be done.  Volanda Napoleon, MD 7/9/20201:03 PM

## 2019-03-16 NOTE — Patient Instructions (Signed)
Romiplostim injection What is this medicine? ROMIPLOSTIM (roe mi PLOE stim) helps your body make more platelets. This medicine is used to treat low platelets caused by chronic idiopathic thrombocytopenic purpura (ITP). This medicine may be used for other purposes; ask your health care provider or pharmacist if you have questions. COMMON BRAND NAME(S): Nplate What should I tell my health care provider before I take this medicine? They need to know if you have any of these conditions:  bleeding disorders  bone marrow problem, like blood cancer or myelodysplastic syndrome  history of blood clots  liver disease  surgery to remove your spleen  an unusual or allergic reaction to romiplostim, mannitol, other medicines, foods, dyes, or preservatives  pregnant or trying to get pregnant  breast-feeding How should I use this medicine? This medicine is for injection under the skin. It is given by a health care professional in a hospital or clinic setting. A special MedGuide will be given to you before your injection. Read this information carefully each time. Talk to your pediatrician regarding the use of this medicine in children. While this drug may be prescribed for children as young as 1 year for selected conditions, precautions do apply. Overdosage: If you think you have taken too much of this medicine contact a poison control center or emergency room at once. NOTE: This medicine is only for you. Do not share this medicine with others. What if I miss a dose? It is important not to miss your dose. Call your doctor or health care professional if you are unable to keep an appointment. What may interact with this medicine? Interactions are not expected. This list may not describe all possible interactions. Give your health care provider a list of all the medicines, herbs, non-prescription drugs, or dietary supplements you use. Also tell them if you smoke, drink alcohol, or use illegal drugs.  Some items may interact with your medicine. What should I watch for while using this medicine? Your condition will be monitored carefully while you are receiving this medicine. Visit your prescriber or health care professional for regular checks on your progress and for the needed blood tests. It is important to keep all appointments. What side effects may I notice from receiving this medicine? Side effects that you should report to your doctor or health care professional as soon as possible:  allergic reactions like skin rash, itching or hives, swelling of the face, lips, or tongue  signs and symptoms of bleeding such as bloody or black, tarry stools; red or dark brown urine; spitting up blood or brown material that looks like coffee grounds; red spots on the skin; unusual bruising or bleeding from the eyes, gums, or nose  signs and symptoms of a blood clot such as chest pain; shortness of breath; pain, swelling, or warmth in the leg  signs and symptoms of a stroke like changes in vision; confusion; trouble speaking or understanding; severe headaches; sudden numbness or weakness of the face, arm or leg; trouble walking; dizziness; loss of balance or coordination Side effects that usually do not require medical attention (report to your doctor or health care professional if they continue or are bothersome):  headache  pain in arms and legs  pain in mouth  stomach pain This list may not describe all possible side effects. Call your doctor for medical advice about side effects. You may report side effects to FDA at 1-800-FDA-1088. Where should I keep my medicine? This drug is given in a hospital or clinic   and will not be stored at home. NOTE: This sheet is a summary. It may not cover all possible information. If you have questions about this medicine, talk to your doctor, pharmacist, or health care provider.  2020 [employer]/Gold Standard (2017-08-23 11:10:55)  

## 2019-03-23 ENCOUNTER — Inpatient Hospital Stay: Payer: Medicare Other

## 2019-03-23 ENCOUNTER — Other Ambulatory Visit: Payer: Self-pay

## 2019-03-23 VITALS — BP 168/55 | HR 86 | Temp 97.4°F | Resp 20

## 2019-03-23 DIAGNOSIS — Z7901 Long term (current) use of anticoagulants: Secondary | ICD-10-CM | POA: Diagnosis not present

## 2019-03-23 DIAGNOSIS — D693 Immune thrombocytopenic purpura: Secondary | ICD-10-CM

## 2019-03-23 DIAGNOSIS — I4891 Unspecified atrial fibrillation: Secondary | ICD-10-CM | POA: Diagnosis not present

## 2019-03-23 DIAGNOSIS — Z86711 Personal history of pulmonary embolism: Secondary | ICD-10-CM | POA: Diagnosis not present

## 2019-03-23 DIAGNOSIS — Z79899 Other long term (current) drug therapy: Secondary | ICD-10-CM | POA: Diagnosis not present

## 2019-03-23 DIAGNOSIS — D509 Iron deficiency anemia, unspecified: Secondary | ICD-10-CM | POA: Diagnosis not present

## 2019-03-23 LAB — CBC WITH DIFFERENTIAL (CANCER CENTER ONLY)
Abs Immature Granulocytes: 0.07 10*3/uL (ref 0.00–0.07)
Basophils Absolute: 0 10*3/uL (ref 0.0–0.1)
Basophils Relative: 0 %
Eosinophils Absolute: 0.2 10*3/uL (ref 0.0–0.5)
Eosinophils Relative: 3 %
HCT: 34.2 % — ABNORMAL LOW (ref 36.0–46.0)
Hemoglobin: 10.9 g/dL — ABNORMAL LOW (ref 12.0–15.0)
Immature Granulocytes: 1 %
Lymphocytes Relative: 17 %
Lymphs Abs: 1.1 10*3/uL (ref 0.7–4.0)
MCH: 29.5 pg (ref 26.0–34.0)
MCHC: 31.9 g/dL (ref 30.0–36.0)
MCV: 92.4 fL (ref 80.0–100.0)
Monocytes Absolute: 0.4 10*3/uL (ref 0.1–1.0)
Monocytes Relative: 6 %
Neutro Abs: 4.7 10*3/uL (ref 1.7–7.7)
Neutrophils Relative %: 73 %
Platelet Count: 166 10*3/uL (ref 150–400)
RBC: 3.7 MIL/uL — ABNORMAL LOW (ref 3.87–5.11)
RDW: 18.3 % — ABNORMAL HIGH (ref 11.5–15.5)
WBC Count: 6.5 10*3/uL (ref 4.0–10.5)
nRBC: 0.3 % — ABNORMAL HIGH (ref 0.0–0.2)

## 2019-03-23 LAB — CMP (CANCER CENTER ONLY)
ALT: 17 U/L (ref 0–44)
AST: 23 U/L (ref 15–41)
Albumin: 4.5 g/dL (ref 3.5–5.0)
Alkaline Phosphatase: 51 U/L (ref 38–126)
Anion gap: 12 (ref 5–15)
BUN: 32 mg/dL — ABNORMAL HIGH (ref 8–23)
CO2: 32 mmol/L (ref 22–32)
Calcium: 9.3 mg/dL (ref 8.9–10.3)
Chloride: 98 mmol/L (ref 98–111)
Creatinine: 2.17 mg/dL — ABNORMAL HIGH (ref 0.44–1.00)
GFR, Est AFR Am: 24 mL/min — ABNORMAL LOW (ref >60)
GFR, Estimated: 21 mL/min — ABNORMAL LOW (ref >60)
Glucose, Bld: 141 mg/dL — ABNORMAL HIGH (ref 70–99)
Potassium: 4.2 mmol/L (ref 3.5–5.1)
Sodium: 142 mmol/L (ref 135–145)
Total Bilirubin: 0.6 mg/dL (ref 0.3–1.2)
Total Protein: 7.4 g/dL (ref 6.5–8.1)

## 2019-03-23 MED ORDER — ROMIPLOSTIM INJECTION 500 MCG
920.0000 ug | Freq: Once | SUBCUTANEOUS | Status: AC
  Administered 2019-03-23: 920 ug via SUBCUTANEOUS
  Filled 2019-03-23: qty 1.84

## 2019-03-23 NOTE — Progress Notes (Signed)
3:21 PM Platelet count 166, to give Nplate per Dr. Marin Olp.

## 2019-03-23 NOTE — Patient Instructions (Signed)
Romiplostim injection What is this medicine? ROMIPLOSTIM (roe mi PLOE stim) helps your body make more platelets. This medicine is used to treat low platelets caused by chronic idiopathic thrombocytopenic purpura (ITP). This medicine may be used for other purposes; ask your health care provider or pharmacist if you have questions. COMMON BRAND NAME(S): Nplate What should I tell my health care provider before I take this medicine? They need to know if you have any of these conditions:  bleeding disorders  bone marrow problem, like blood cancer or myelodysplastic syndrome  history of blood clots  liver disease  surgery to remove your spleen  an unusual or allergic reaction to romiplostim, mannitol, other medicines, foods, dyes, or preservatives  pregnant or trying to get pregnant  breast-feeding How should I use this medicine? This medicine is for injection under the skin. It is given by a health care professional in a hospital or clinic setting. A special MedGuide will be given to you before your injection. Read this information carefully each time. Talk to your pediatrician regarding the use of this medicine in children. While this drug may be prescribed for children as young as 1 year for selected conditions, precautions do apply. Overdosage: If you think you have taken too much of this medicine contact a poison control center or emergency room at once. NOTE: This medicine is only for you. Do not share this medicine with others. What if I miss a dose? It is important not to miss your dose. Call your doctor or health care professional if you are unable to keep an appointment. What may interact with this medicine? Interactions are not expected. This list may not describe all possible interactions. Give your health care provider a list of all the medicines, herbs, non-prescription drugs, or dietary supplements you use. Also tell them if you smoke, drink alcohol, or use illegal drugs.  Some items may interact with your medicine. What should I watch for while using this medicine? Your condition will be monitored carefully while you are receiving this medicine. Visit your prescriber or health care professional for regular checks on your progress and for the needed blood tests. It is important to keep all appointments. What side effects may I notice from receiving this medicine? Side effects that you should report to your doctor or health care professional as soon as possible:  allergic reactions like skin rash, itching or hives, swelling of the face, lips, or tongue  signs and symptoms of bleeding such as bloody or black, tarry stools; red or dark brown urine; spitting up blood or brown material that looks like coffee grounds; red spots on the skin; unusual bruising or bleeding from the eyes, gums, or nose  signs and symptoms of a blood clot such as chest pain; shortness of breath; pain, swelling, or warmth in the leg  signs and symptoms of a stroke like changes in vision; confusion; trouble speaking or understanding; severe headaches; sudden numbness or weakness of the face, arm or leg; trouble walking; dizziness; loss of balance or coordination Side effects that usually do not require medical attention (report to your doctor or health care professional if they continue or are bothersome):  headache  pain in arms and legs  pain in mouth  stomach pain This list may not describe all possible side effects. Call your doctor for medical advice about side effects. You may report side effects to FDA at 1-800-FDA-1088. Where should I keep my medicine? This drug is given in a hospital or clinic   and will not be stored at home. NOTE: This sheet is a summary. It may not cover all possible information. If you have questions about this medicine, talk to your doctor, pharmacist, or health care provider.  2020 [employer]/Gold Standard (2017-08-23 11:10:55)  

## 2019-03-28 ENCOUNTER — Ambulatory Visit: Payer: Medicare Other | Admitting: Internal Medicine

## 2019-03-30 ENCOUNTER — Inpatient Hospital Stay: Payer: Medicare Other

## 2019-03-30 ENCOUNTER — Other Ambulatory Visit: Payer: Self-pay

## 2019-03-30 ENCOUNTER — Telehealth: Payer: Self-pay

## 2019-03-30 DIAGNOSIS — D509 Iron deficiency anemia, unspecified: Secondary | ICD-10-CM | POA: Diagnosis not present

## 2019-03-30 DIAGNOSIS — Z7901 Long term (current) use of anticoagulants: Secondary | ICD-10-CM | POA: Diagnosis not present

## 2019-03-30 DIAGNOSIS — Z86711 Personal history of pulmonary embolism: Secondary | ICD-10-CM | POA: Diagnosis not present

## 2019-03-30 DIAGNOSIS — D693 Immune thrombocytopenic purpura: Secondary | ICD-10-CM

## 2019-03-30 DIAGNOSIS — I4891 Unspecified atrial fibrillation: Secondary | ICD-10-CM | POA: Diagnosis not present

## 2019-03-30 DIAGNOSIS — Z79899 Other long term (current) drug therapy: Secondary | ICD-10-CM | POA: Diagnosis not present

## 2019-03-30 LAB — CBC WITH DIFFERENTIAL (CANCER CENTER ONLY)
Abs Immature Granulocytes: 0.08 10*3/uL — ABNORMAL HIGH (ref 0.00–0.07)
Basophils Absolute: 0 10*3/uL (ref 0.0–0.1)
Basophils Relative: 0 %
Eosinophils Absolute: 0.2 10*3/uL (ref 0.0–0.5)
Eosinophils Relative: 2 %
HCT: 33.7 % — ABNORMAL LOW (ref 36.0–46.0)
Hemoglobin: 10.7 g/dL — ABNORMAL LOW (ref 12.0–15.0)
Immature Granulocytes: 1 %
Lymphocytes Relative: 14 %
Lymphs Abs: 1.2 10*3/uL (ref 0.7–4.0)
MCH: 29.8 pg (ref 26.0–34.0)
MCHC: 31.8 g/dL (ref 30.0–36.0)
MCV: 93.9 fL (ref 80.0–100.0)
Monocytes Absolute: 0.6 10*3/uL (ref 0.1–1.0)
Monocytes Relative: 7 %
Neutro Abs: 6.5 10*3/uL (ref 1.7–7.7)
Neutrophils Relative %: 76 %
Platelet Count: 284 10*3/uL (ref 150–400)
RBC: 3.59 MIL/uL — ABNORMAL LOW (ref 3.87–5.11)
RDW: 18.6 % — ABNORMAL HIGH (ref 11.5–15.5)
WBC Count: 8.6 10*3/uL (ref 4.0–10.5)
nRBC: 0 % (ref 0.0–0.2)

## 2019-03-30 LAB — CMP (CANCER CENTER ONLY)
ALT: 14 U/L (ref 0–44)
AST: 20 U/L (ref 15–41)
Albumin: 4.4 g/dL (ref 3.5–5.0)
Alkaline Phosphatase: 44 U/L (ref 38–126)
Anion gap: 11 (ref 5–15)
BUN: 31 mg/dL — ABNORMAL HIGH (ref 8–23)
CO2: 33 mmol/L — ABNORMAL HIGH (ref 22–32)
Calcium: 9.2 mg/dL (ref 8.9–10.3)
Chloride: 99 mmol/L (ref 98–111)
Creatinine: 2.21 mg/dL — ABNORMAL HIGH (ref 0.44–1.00)
GFR, Est AFR Am: 23 mL/min — ABNORMAL LOW (ref >60)
GFR, Estimated: 20 mL/min — ABNORMAL LOW (ref >60)
Glucose, Bld: 143 mg/dL — ABNORMAL HIGH (ref 70–99)
Potassium: 4.3 mmol/L (ref 3.5–5.1)
Sodium: 143 mmol/L (ref 135–145)
Total Bilirubin: 0.5 mg/dL (ref 0.3–1.2)
Total Protein: 7.2 g/dL (ref 6.5–8.1)

## 2019-03-30 NOTE — Telephone Encounter (Signed)
No Nplate today per Dr Marin Olp d/t platelets 284. Pt notified in lobby and verbalizes understanding. dph

## 2019-04-06 ENCOUNTER — Other Ambulatory Visit: Payer: Self-pay

## 2019-04-06 ENCOUNTER — Inpatient Hospital Stay: Payer: Medicare Other

## 2019-04-06 DIAGNOSIS — D509 Iron deficiency anemia, unspecified: Secondary | ICD-10-CM | POA: Diagnosis not present

## 2019-04-06 DIAGNOSIS — D693 Immune thrombocytopenic purpura: Secondary | ICD-10-CM

## 2019-04-06 DIAGNOSIS — Z86711 Personal history of pulmonary embolism: Secondary | ICD-10-CM | POA: Diagnosis not present

## 2019-04-06 DIAGNOSIS — I4891 Unspecified atrial fibrillation: Secondary | ICD-10-CM | POA: Diagnosis not present

## 2019-04-06 DIAGNOSIS — Z79899 Other long term (current) drug therapy: Secondary | ICD-10-CM | POA: Diagnosis not present

## 2019-04-06 DIAGNOSIS — Z7901 Long term (current) use of anticoagulants: Secondary | ICD-10-CM | POA: Diagnosis not present

## 2019-04-06 LAB — CMP (CANCER CENTER ONLY)
ALT: 12 U/L (ref 0–44)
AST: 17 U/L (ref 15–41)
Albumin: 4.2 g/dL (ref 3.5–5.0)
Alkaline Phosphatase: 46 U/L (ref 38–126)
Anion gap: 10 (ref 5–15)
BUN: 31 mg/dL — ABNORMAL HIGH (ref 8–23)
CO2: 32 mmol/L (ref 22–32)
Calcium: 9.5 mg/dL (ref 8.9–10.3)
Chloride: 100 mmol/L (ref 98–111)
Creatinine: 2.14 mg/dL — ABNORMAL HIGH (ref 0.44–1.00)
GFR, Est AFR Am: 24 mL/min — ABNORMAL LOW (ref >60)
GFR, Estimated: 21 mL/min — ABNORMAL LOW (ref >60)
Glucose, Bld: 152 mg/dL — ABNORMAL HIGH (ref 70–99)
Potassium: 4.4 mmol/L (ref 3.5–5.1)
Sodium: 142 mmol/L (ref 135–145)
Total Bilirubin: 0.5 mg/dL (ref 0.3–1.2)
Total Protein: 7.2 g/dL (ref 6.5–8.1)

## 2019-04-06 LAB — CBC WITH DIFFERENTIAL (CANCER CENTER ONLY)
Abs Immature Granulocytes: 0.05 10*3/uL (ref 0.00–0.07)
Basophils Absolute: 0 10*3/uL (ref 0.0–0.1)
Basophils Relative: 0 %
Eosinophils Absolute: 0.2 10*3/uL (ref 0.0–0.5)
Eosinophils Relative: 2 %
HCT: 33.8 % — ABNORMAL LOW (ref 36.0–46.0)
Hemoglobin: 10.8 g/dL — ABNORMAL LOW (ref 12.0–15.0)
Immature Granulocytes: 1 %
Lymphocytes Relative: 17 %
Lymphs Abs: 1.1 10*3/uL (ref 0.7–4.0)
MCH: 30 pg (ref 26.0–34.0)
MCHC: 32 g/dL (ref 30.0–36.0)
MCV: 93.9 fL (ref 80.0–100.0)
Monocytes Absolute: 0.4 10*3/uL (ref 0.1–1.0)
Monocytes Relative: 6 %
Neutro Abs: 5.1 10*3/uL (ref 1.7–7.7)
Neutrophils Relative %: 74 %
Platelet Count: 232 10*3/uL (ref 150–400)
RBC: 3.6 MIL/uL — ABNORMAL LOW (ref 3.87–5.11)
RDW: 18.6 % — ABNORMAL HIGH (ref 11.5–15.5)
WBC Count: 6.8 10*3/uL (ref 4.0–10.5)
nRBC: 0 % (ref 0.0–0.2)

## 2019-04-06 NOTE — Progress Notes (Signed)
Per dr Marin Olp hold n-plate today, d/t platelet count of 232,000. Return 1 week for lab and possible injection

## 2019-04-12 ENCOUNTER — Encounter: Payer: Self-pay | Admitting: Pulmonary Disease

## 2019-04-12 ENCOUNTER — Ambulatory Visit (INDEPENDENT_AMBULATORY_CARE_PROVIDER_SITE_OTHER): Payer: Medicare Other | Admitting: Pulmonary Disease

## 2019-04-12 ENCOUNTER — Other Ambulatory Visit: Payer: Self-pay

## 2019-04-12 DIAGNOSIS — G4733 Obstructive sleep apnea (adult) (pediatric): Secondary | ICD-10-CM | POA: Diagnosis not present

## 2019-04-12 NOTE — Patient Instructions (Addendum)
Try to start using CPAP everyday  Speak with Assisted living staff about helping with adherence   2 month compliance check needed    We will contact your DME company about your issues with your mask replacement and seals   [facility] [facility] >>>Contact number for them is Dimas Chyle [phone] or [phone] ext [phone]   I recommend that you contact the [employer] device company regarding your questions and concerns regarding the so clean device that you are using at home   We recommend that you continue using your CPAP daily >>>Keep up the hard work using your device >>> Goal should be wearing this for the entire night that you are sleeping, at least 4 to 6 hours  Remember:  . Do not drive or operate heavy machinery if tired or drowsy.  . Please notify the supply company and office if you are unable to use your device regularly due to missing supplies or machine being broken.  . Work on maintaining a healthy weight and following your recommended nutrition plan  . Maintain proper daily exercise and movement  . Maintaining proper use of your device can also help improve management of other chronic illnesses such as: Blood pressure, blood sugars, and weight management.   BiPAP/ CPAP Cleaning:  >>>Clean weekly, with Dawn soap, and bottle brush.  Set up to air dry.   Return in about 6 months (around 10/13/2019), or if symptoms worsen or fail to improve, for Follow up with Dr. Elsworth Soho.  Coronavirus (COVID-19) Are you at risk?  Are you at risk for the Coronavirus (COVID-19)?  To be considered HIGH RISK for Coronavirus (COVID-19), you have to meet the following criteria:  . Traveled to [place], [place], [place], [place] or [place]; or in the [place] to [place], [place], [place], or [place]; and have fever, cough, and shortness of breath within the last 2 weeks of travel OR . Been in close contact with a person diagnosed with COVID-19 within the last 2 weeks  and have fever, cough, and shortness of breath . IF YOU DO NOT MEET THESE CRITERIA, YOU ARE CONSIDERED LOW RISK FOR COVID-19.  What to do if you are HIGH RISK for COVID-19?  Marland Kitchen If you are having a medical emergency, call 911. . Seek medical care right away. Before you go to a doctor's office, urgent care or emergency department, call ahead and tell them about your recent travel, contact with someone diagnosed with COVID-19, and your symptoms. You should receive instructions from your physician's office regarding next steps of care.  . When you arrive at healthcare provider, tell the healthcare staff immediately you have returned from visiting [place], [place], [place], [place] or [place]; or traveled in the [place] to [place], [place], [place], or [place]; in the last two weeks or you have been in close contact with a person diagnosed with COVID-19 in the last 2 weeks.   . Tell the health care staff about your symptoms: fever, cough and shortness of breath. . After you have been seen by a medical provider, you will be either: o Tested for (COVID-19) and discharged home on quarantine except to seek medical care if symptoms worsen, and asked to  - Stay home and avoid contact with others until you get your results (4-5 days)  - Avoid travel on public transportation if possible (such as bus, train, or airplane) or o Sent to the Emergency Department by EMS  for evaluation, COVID-19 testing, and possible admission depending on your condition and test results.  What to do if you are LOW RISK for COVID-19?  Reduce your risk of any infection by using the same precautions used for avoiding the common cold or flu:  Marland Kitchen Wash your hands often with soap and warm water for at least 20 seconds.  If soap and water are not readily available, use an alcohol-based hand sanitizer with at least 60% alcohol.  . If coughing or sneezing, cover your mouth and nose by coughing or sneezing into the elbow areas of  your shirt or coat, into a tissue or into your sleeve (not your hands). . Avoid shaking hands with others and consider head nods or verbal greetings only. . Avoid touching your eyes, nose, or mouth with unwashed hands.  . Avoid close contact with people who are sick. . Avoid places or events with large numbers of people in one location, like concerts or sporting events. . Carefully consider travel plans you have or are making. . If you are planning any travel outside or inside the [place], visit the CDC's Travelers' Health webpage for the latest health notices. . If you have some symptoms but not all symptoms, continue to monitor at home and seek medical attention if your symptoms worsen. . If you are having a medical emergency, call 911.   [place] / e-Visit: [email or web address]         [facility]: [facility]: [phone]                   [facility]: [phone]           It is flu season:   >>> Best ways to protect herself from the flu: Receive the yearly flu vaccine, practice good hand hygiene washing with soap and also using hand sanitizer when available, eat a nutritious meals, get adequate rest, hydrate appropriately   Please contact the office if your symptoms worsen or you have concerns that you are not improving.   Thank you for choosing [facility] for your healthcare, and for allowing [place] to partner with you on your healthcare journey. I am thankful to be able to provide care to you today.   Wyn Quaker FNP-C    Living With Sleep Apnea Sleep apnea is a condition in which breathing pauses or becomes shallow during sleep. Sleep apnea is most commonly caused by a collapsed or blocked airway. People with sleep apnea snore loudly and have times when they gasp and stop breathing for 10 seconds or more during  sleep. This happens over and over during the night. This disrupts your sleep and keeps your body from getting the rest that it needs, which can cause tiredness and lack of energy (fatigue) during the day. The breaks in breathing also interrupt the deep sleep that you need to feel rested. Even if you do not completely wake up from the gaps in breathing, your sleep may not be restful. You may also have a headache in the morning and low energy during the day, and you may feel anxious or depressed. How can sleep apnea affect me? Sleep apnea increases your chances of extreme tiredness during the day (daytime fatigue). It can also increase your risk for health conditions, such as:  Heart attack.  Stroke.  Diabetes.  Heart failure.  Irregular heartbeat.  High blood pressure. If you have  daytime fatigue as a result of sleep apnea, you may be more likely to:  Perform poorly at school or work.  Fall asleep while driving.  Have difficulty with attention.  Develop depression or anxiety.  Become severely overweight (obese).  Have sexual dysfunction. What actions can I take to manage sleep apnea? Sleep apnea treatment   If you were given a device to open your airway while you sleep, use it only as told by your health care provider. You may be given: ? An oral appliance. This is a custom-made mouthpiece that shifts your lower jaw forward. ? A continuous positive airway pressure (CPAP) device. This device blows air through a mask when you breathe out (exhale). ? A nasal expiratory positive airway pressure (EPAP) device. This device has valves that you put into each nostril. ? A bi-level positive airway pressure (BPAP) device. This device blows air through a mask when you breathe in (inhale) and breathe out (exhale).  You may need surgery if other treatments do not work for you. Sleep habits  Go to sleep and wake up at the same time every day. This helps set your internal clock (circadian  rhythm) for sleeping. ? If you stay up later than usual, such as on weekends, try to get up in the morning within 2 hours of your normal wake time.  Try to get at least 7-9 hours of sleep each night.  Stop computer, tablet, and mobile phone use a few hours before bedtime.  Do not take long naps during the day. If you nap, limit it to 30 minutes.  Have a relaxing bedtime routine. Reading or listening to music may relax you and help you sleep.  Use your bedroom only for sleep. ? Keep your television and computer out of your bedroom. ? Keep your bedroom cool, dark, and quiet. ? Use a supportive mattress and pillows.  Follow your health care provider's instructions for other changes to sleep habits. Nutrition  Do not eat heavy meals in the evening.  Do not have caffeine in the later part of the day. The effects of caffeine can last for more than 5 hours.  Follow your health care provider's or dietitian's instructions for any diet changes. Lifestyle      Do not drink alcohol before bedtime. Alcohol can cause you to fall asleep at first, but then it can cause you to wake up in the middle of the night and have trouble getting back to sleep.  Do not use any products that contain nicotine or tobacco, such as cigarettes and e-cigarettes. If you need help quitting, ask your health care provider. Medicines  Take over-the-counter and prescription medicines only as told by your health care provider.  Do not use over-the-counter sleep medicine. You can become dependent on this medicine, and it can make sleep apnea worse.  Do not use medicines, such as sedatives and narcotics, unless told by your health care provider. Activity  Exercise on most days, but avoid exercising in the evening. Exercising near bedtime can interfere with sleeping.  If possible, spend time outside every day. Natural light helps regulate your circadian rhythm. General information  Lose weight if you need to, and  maintain a healthy weight.  Keep all follow-up visits as told by your health care provider. This is important.  If you are having surgery, make sure to tell your health care provider that you have sleep apnea. You may need to bring your device with you. Where to find more information  Learn more about sleep apnea and daytime fatigue from:  American Sleep Association: sleepassociation.[place]: sleepfoundation.org  National Heart, Lung, and Blood Institute: https://www.hartman-hill.biz/ Summary  Sleep apnea can cause daytime fatigue and other serious health conditions.  Both sleep apnea and daytime fatigue can be bad for your health and well-being.  You may need to wear a device while sleeping to help keep your airway open.  If you are having surgery, make sure to tell your health care provider that you have sleep apnea. You may need to bring your device with you.  Making changes to sleep habits, diet, lifestyle, and activity can help you manage sleep apnea. This information is not intended to replace advice given to you by your health care provider. Make sure you discuss any questions you have with your health care provider. Document Released: 11/18/2017 Document Revised: 12/16/2018 Document Reviewed: 11/18/2017 Elsevier Patient Education  [place].

## 2019-04-12 NOTE — Addendum Note (Signed)
Addended by: Hildred Alamin I on: 04/12/2019 10:52 AM   Modules accepted: Orders

## 2019-04-12 NOTE — Progress Notes (Signed)
Virtual Visit via Telephone Note  I connected with [name] on 04/12/19 at 11:15 AM EDT by telephone and verified that I am speaking with the correct person using two identifiers.  Location: Patient: Home Provider: [facility] - [address], [place], [place], [place] [address]   I discussed the limitations, risks, security and privacy concerns of performing an evaluation and management service by telephone and the availability of in person appointments. I also discussed with the patient that there may be a patient responsible charge related to this service. The patient expressed understanding and agreed to proceed.  Patient consented to consult via telephone: Yes People present and their role in pt care: Pt     History of Present Illness: 84 year old female former smoker followed in our office for severe obstructive sleep apnea  Past medical history: Hypothyroidism, gout, obesity, hypertension, depression, anxiety, leg edema, chronic kidney disease stage IV Smoking history: Former smoker, quit 1991 Maintenance: None  Patient of Dr. Elsworth Soho  Chief complaint: OSA follow up with CPAP    84 year old female followed in our office for moderate obstructive sleep apnea based off the 2019 sleep study report with an AHI of 26.  Patient completing a tele-visit with our office today to complete her yearly follow-up required for insurance as well as DME companies.  Patient has poor compliance to CPAP.  See CPAP compliance report listed below:  03/11/2019-04/09/2019-CPAP compliance report-15 our last 30 days used, 5 of those days greater than 4 hours, average usage 3 hours and 17 minutes, APAP settings 5-10, 95th percentile 6.7, AHI 2.8  Patient likes new CPAP.  She admits that sometimes when she is sleeping with it she wakes up and it is on the floor.  She thinks that she takes this off at night while she is sleeping and not awake.  Sometimes she falls asleep prior to putting the  CPAP on.  She lives now in assisted living facility and is wondering if anyone there could help her with adhering to CPAP use daily.  Patient reports clinical improvement when she wears her CPAP.  Overall she likes the fit of the mask.  She does currently have questions regarding the DME company [employer] that she is using as they are sending equipment but she does not know how to apply that to her mask.  She is wondering if the mask needs to be replaced as it has not been replaced in over a year.  Observations/Objective:  Diagnostic PSG in 8/09 showed severe obstructive sleep apnea with AHI 85/h, TST 121 mins - no REM sleep. Subsequent titration study showed CPAP requirement of 15 cm with small comfort gel mask  02/16/2018-sleep study report- AHI 26 >>> Results to be scanned into our system >>> [facility]  Assessment and Plan:  Sleep apnea Plan: Continue CPAP use at home Wear CPAP nightly or if you are taking naps Talk with the assisted living facility staff to see if they can check on you in the evenings to make sure that your CPAP is applied and that you are using it >>> If staff contact our office requesting an order for this to be done okay to send order We will check a 43-month compliance to your adherence We will place an order for [employer] your DME company to contact you regarding your supplies as well as your mask seal Follow-up in 6 months with an in person office visit with Dr. Elsworth Soho May need to consider earlier office  visit based off of 46-month compliance check   Follow Up Instructions:  Return in about 6 months (around 10/13/2019), or if symptoms worsen or fail to improve, for Follow up with Dr. Elsworth Soho.   I discussed the assessment and treatment plan with the patient. The patient was provided an opportunity to ask questions and all were answered. The patient agreed with the plan and demonstrated an understanding of the instructions.   The patient was advised to call back or seek  an in-person evaluation if the symptoms worsen or if the condition fails to improve as anticipated.  I provided 23 minutes of non-face-to-face time during this encounter.   Lauraine Rinne, NP

## 2019-04-12 NOTE — Assessment & Plan Note (Signed)
Plan: Continue CPAP use at home Wear CPAP nightly or if you are taking naps Talk with the assisted living facility staff to see if they can check on you in the evenings to make sure that your CPAP is applied and that you are using it >>> If staff contact our office requesting an order for this to be done okay to send order We will check a 61-month compliance to your adherence We will place an order for [employer] your DME company to contact you regarding your supplies as well as your mask seal Follow-up in 6 months with an in person office visit with Dr. Elsworth Soho May need to consider earlier office visit based off of 16-month compliance check

## 2019-04-13 ENCOUNTER — Inpatient Hospital Stay: Payer: Medicare Other | Attending: Hematology & Oncology

## 2019-04-13 ENCOUNTER — Inpatient Hospital Stay (HOSPITAL_BASED_OUTPATIENT_CLINIC_OR_DEPARTMENT_OTHER): Payer: Medicare Other | Admitting: Hematology & Oncology

## 2019-04-13 ENCOUNTER — Inpatient Hospital Stay: Payer: Medicare Other

## 2019-04-13 ENCOUNTER — Encounter: Payer: Self-pay | Admitting: Hematology & Oncology

## 2019-04-13 ENCOUNTER — Telehealth: Payer: Self-pay | Admitting: Cardiology

## 2019-04-13 ENCOUNTER — Other Ambulatory Visit: Payer: Self-pay

## 2019-04-13 VITALS — BP 135/77 | HR 113 | Temp 97.7°F | Resp 20

## 2019-04-13 DIAGNOSIS — D509 Iron deficiency anemia, unspecified: Secondary | ICD-10-CM | POA: Insufficient documentation

## 2019-04-13 DIAGNOSIS — Z7901 Long term (current) use of anticoagulants: Secondary | ICD-10-CM | POA: Diagnosis not present

## 2019-04-13 DIAGNOSIS — Z86711 Personal history of pulmonary embolism: Secondary | ICD-10-CM | POA: Insufficient documentation

## 2019-04-13 DIAGNOSIS — N184 Chronic kidney disease, stage 4 (severe): Secondary | ICD-10-CM | POA: Diagnosis not present

## 2019-04-13 DIAGNOSIS — D693 Immune thrombocytopenic purpura: Secondary | ICD-10-CM

## 2019-04-13 DIAGNOSIS — I4891 Unspecified atrial fibrillation: Secondary | ICD-10-CM | POA: Diagnosis not present

## 2019-04-13 LAB — CBC WITH DIFFERENTIAL (CANCER CENTER ONLY)
Abs Immature Granulocytes: 0.02 10*3/uL (ref 0.00–0.07)
Basophils Absolute: 0 10*3/uL (ref 0.0–0.1)
Basophils Relative: 1 %
Eosinophils Absolute: 0.1 10*3/uL (ref 0.0–0.5)
Eosinophils Relative: 2 %
HCT: 32.6 % — ABNORMAL LOW (ref 36.0–46.0)
Hemoglobin: 10.4 g/dL — ABNORMAL LOW (ref 12.0–15.0)
Immature Granulocytes: 0 %
Lymphocytes Relative: 17 %
Lymphs Abs: 1 10*3/uL (ref 0.7–4.0)
MCH: 29.5 pg (ref 26.0–34.0)
MCHC: 31.9 g/dL (ref 30.0–36.0)
MCV: 92.6 fL (ref 80.0–100.0)
Monocytes Absolute: 0.4 10*3/uL (ref 0.1–1.0)
Monocytes Relative: 7 %
Neutro Abs: 4.4 10*3/uL (ref 1.7–7.7)
Neutrophils Relative %: 73 %
Platelet Count: 88 10*3/uL — ABNORMAL LOW (ref 150–400)
RBC: 3.52 MIL/uL — ABNORMAL LOW (ref 3.87–5.11)
RDW: 18.6 % — ABNORMAL HIGH (ref 11.5–15.5)
WBC Count: 6 10*3/uL (ref 4.0–10.5)
nRBC: 0 % (ref 0.0–0.2)

## 2019-04-13 LAB — CMP (CANCER CENTER ONLY)
ALT: 14 U/L (ref 0–44)
AST: 17 U/L (ref 15–41)
Albumin: 4.2 g/dL (ref 3.5–5.0)
Alkaline Phosphatase: 42 U/L (ref 38–126)
Anion gap: 10 (ref 5–15)
BUN: 31 mg/dL — ABNORMAL HIGH (ref 8–23)
CO2: 33 mmol/L — ABNORMAL HIGH (ref 22–32)
Calcium: 9.1 mg/dL (ref 8.9–10.3)
Chloride: 97 mmol/L — ABNORMAL LOW (ref 98–111)
Creatinine: 2.22 mg/dL — ABNORMAL HIGH (ref 0.44–1.00)
GFR, Est AFR Am: 23 mL/min — ABNORMAL LOW (ref >60)
GFR, Estimated: 20 mL/min — ABNORMAL LOW (ref >60)
Glucose, Bld: 194 mg/dL — ABNORMAL HIGH (ref 70–99)
Potassium: 4.9 mmol/L (ref 3.5–5.1)
Sodium: 140 mmol/L (ref 135–145)
Total Bilirubin: 0.6 mg/dL (ref 0.3–1.2)
Total Protein: 7 g/dL (ref 6.5–8.1)

## 2019-04-13 LAB — PLATELET BY CITRATE

## 2019-04-13 MED ORDER — ROMIPLOSTIM INJECTION 500 MCG
8.0000 ug/kg | Freq: Once | SUBCUTANEOUS | Status: AC
  Administered 2019-04-13: 920 ug via SUBCUTANEOUS
  Filled 2019-04-13: qty 1.84

## 2019-04-13 NOTE — Patient Instructions (Signed)
Romiplostim injection What is this medicine? ROMIPLOSTIM (roe mi PLOE stim) helps your body make more platelets. This medicine is used to treat low platelets caused by chronic idiopathic thrombocytopenic purpura (ITP). This medicine may be used for other purposes; ask your health care provider or pharmacist if you have questions. COMMON BRAND NAME(S): Nplate What should I tell my health care provider before I take this medicine? They need to know if you have any of these conditions:  bleeding disorders  bone marrow problem, like blood cancer or myelodysplastic syndrome  history of blood clots  liver disease  surgery to remove your spleen  an unusual or allergic reaction to romiplostim, mannitol, other medicines, foods, dyes, or preservatives  pregnant or trying to get pregnant  breast-feeding How should I use this medicine? This medicine is for injection under the skin. It is given by a health care professional in a hospital or clinic setting. A special MedGuide will be given to you before your injection. Read this information carefully each time. Talk to your pediatrician regarding the use of this medicine in children. While this drug may be prescribed for children as young as 1 year for selected conditions, precautions do apply. Overdosage: If you think you have taken too much of this medicine contact a poison control center or emergency room at once. NOTE: This medicine is only for you. Do not share this medicine with others. What if I miss a dose? It is important not to miss your dose. Call your doctor or health care professional if you are unable to keep an appointment. What may interact with this medicine? Interactions are not expected. This list may not describe all possible interactions. Give your health care provider a list of all the medicines, herbs, non-prescription drugs, or dietary supplements you use. Also tell them if you smoke, drink alcohol, or use illegal drugs.  Some items may interact with your medicine. What should I watch for while using this medicine? Your condition will be monitored carefully while you are receiving this medicine. Visit your prescriber or health care professional for regular checks on your progress and for the needed blood tests. It is important to keep all appointments. What side effects may I notice from receiving this medicine? Side effects that you should report to your doctor or health care professional as soon as possible:  allergic reactions like skin rash, itching or hives, swelling of the face, lips, or tongue  signs and symptoms of bleeding such as bloody or black, tarry stools; red or dark brown urine; spitting up blood or brown material that looks like coffee grounds; red spots on the skin; unusual bruising or bleeding from the eyes, gums, or nose  signs and symptoms of a blood clot such as chest pain; shortness of breath; pain, swelling, or warmth in the leg  signs and symptoms of a stroke like changes in vision; confusion; trouble speaking or understanding; severe headaches; sudden numbness or weakness of the face, arm or leg; trouble walking; dizziness; loss of balance or coordination Side effects that usually do not require medical attention (report to your doctor or health care professional if they continue or are bothersome):  headache  pain in arms and legs  pain in mouth  stomach pain This list may not describe all possible side effects. Call your doctor for medical advice about side effects. You may report side effects to FDA at 1-800-FDA-1088. Where should I keep my medicine? This drug is given in a hospital or clinic   and will not be stored at home. NOTE: This sheet is a summary. It may not cover all possible information. If you have questions about this medicine, talk to your doctor, pharmacist, or health care provider.  2020 [employer]/Gold Standard (2017-08-23 11:10:55)  

## 2019-04-13 NOTE — Progress Notes (Signed)
Hematology and Oncology Follow Up Visit  BOLUWATIFE MUTCHLER [id number] [birth year] 84 y.o. 04/13/2019   Principle Diagnosis:  Immune based thrombocytopenia History of PE Iron deficiency anemia Atrial fib  Current Therapy:   Nplate q week for platelet count < 100K IV Iron as indicated    Interim History:  Ms. Lepak is here today for follow-up.  So far, she is done growing well with the Nplate.  Her platelet count has been in the 200,000 range.  She has not had Nplate for 3 weeks.  Her platelet count is down to 88,000.  We will get it back up.  I really think that she could go on anticoagulation for her atrial fibrillation.  I think that she is at significant risk for cerebrovascular disease with the atrial fibrillation off anticoagulation.  I know that cardiology is worried about her thrombocytopenia.  We will help take care of this.  I will speak to her cardiologist regarding this.  Otherwise she is doing okay.  Her blood sugars are under better control.  There is been no issues with fever.  She has had no cough.  She has had no change in bowel or bladder habits.  She is had some leg swelling.  This is been chronic.  Overall, her performance status is ECOG 1.  Medications:  Allergies as of 04/13/2019      Reactions   Apixaban Other (See Comments)   Internal Bleeding   Aspirin Itching, Rash, Hives, Swelling   Swelling of her tongue   Mirabegron Other (See Comments)   Patient experienced A-Fib   Metformin Diarrhea, Nausea Only   Pineapple Swelling   Throat swells and blisters on tongue and roof of mouth per patient   Tetracycline Hives   Fluticasone-salmeterol Itching, Rash   Iodinated Diagnostic Agents Itching, Rash      Lactose Intolerance (gi) Other (See Comments)   gas   Latex Itching, Rash, Other (See Comments)   Pt. States latex pulls her skin off.    Lisinopril Cough   Metronidazole Other (See Comments)   Unknown   Sulfa Antibiotics Rash   Sulfonamide  Derivatives Itching, Rash      Medication List       Accurate as of April 13, 2019  3:34 PM. If you have any questions, ask your nurse or doctor.        acetaminophen 325 MG tablet Commonly known as: TYLENOL Take 650 mg by mouth every 6 (six) hours as needed.   atorvastatin 40 MG tablet Commonly known as: LIPITOR Take 40 mg by mouth daily.   Banophen 25 MG tablet Generic drug: diphenhydrAMINE Take 25 mg by mouth daily.   bisacodyl 5 MG EC tablet Commonly known as: DULCOLAX Take 5 mg by mouth daily as needed for moderate constipation. As needed every three days   CALCIUM 1200 PO Take 1 tablet by mouth daily.   diltiazem 180 MG 24 hr capsule Commonly known as: DILACOR XR Take 180 mg by mouth daily.   diltiazem 180 MG 24 hr capsule Commonly known as: CARDIZEM CD Take 1 capsule (180 mg total) by mouth daily.   docusate sodium 100 MG capsule Commonly known as: COLACE Take 1 capsule by mouth daily.   Dulaglutide 1.5 MG/0.5ML Sopn Commonly known as: Trulicity Inject 1.5 mg into the skin once a week.   fenofibrate 54 MG tablet TAKE 1 TABLET(54 MG) BY MOUTH DAILY   fexofenadine 180 MG tablet Commonly known as: ALLEGRA Take 180 mg by mouth  daily.   Flaxseed Oil 1000 MG Caps Take by mouth 2 (two) times daily.   furosemide 40 MG tablet Commonly known as: LASIX 40 mg daily. Takes 20 some days and others take 40   HumaLOG 100 UNIT/ML injection Generic drug: insulin lispro Inject 35 Units into the skin 3 (three) times daily with meals. 04/13/2019 On Sliding Scale.   insulin glargine 100 UNIT/ML injection Commonly known as: Lantus Inject 0.4 mLs (40 Units total) into the skin daily. What changed: when to take this   Insulin Pen Needle 31G X 5 MM Misc Commonly known as: B-D UF III MINI PEN NEEDLES Use daily for insulin injection   INSULIN SYRINGE 1CC/30GX5/16" 30G X 5/16" 1 ML Misc USE BID UTD   levothyroxine 112 MCG tablet Commonly known as: SYNTHROID TAKE  1 TABLET BY MOUTH DAILY   multivitamin tablet Take 1 tablet by mouth daily.   omeprazole 20 MG capsule Commonly known as: PRILOSEC   ondansetron 4 MG tablet Commonly known as: ZOFRAN Take 4 mg by mouth every 8 (eight) hours as needed for nausea or vomiting.   potassium citrate 10 MEQ (1080 MG) SR tablet Commonly known as: UROCIT-K TK 2 TABLETS PO BID WITH MEALS   PROBIOTIC PO Take 1 capsule by mouth daily.   ranitidine 150 MG tablet Commonly known as: ZANTAC   sertraline 100 MG tablet Commonly known as: ZOLOFT Take 1 tablet by mouth daily.   triamcinolone cream 0.1 % Commonly known as: KENALOG   vitamin C 250 MG tablet Commonly known as: ASCORBIC ACID Take 250 mg by mouth daily.   Vitamin D3 50 MCG (2000 UT) Tabs Take 2,000 Units by mouth daily.       Allergies:  Allergies  Allergen Reactions  . Apixaban Other (See Comments)    Internal Bleeding  . Aspirin Itching, Rash, Hives and Swelling    Swelling of her tongue  . Mirabegron Other (See Comments)    Patient experienced A-Fib  . Metformin Diarrhea and Nausea Only  . Pineapple Swelling    Throat swells and blisters on tongue and roof of mouth per patient  . Tetracycline Hives  . Fluticasone-Salmeterol Itching and Rash  . Iodinated Diagnostic Agents Itching and Rash       . Lactose Intolerance (Gi) Other (See Comments)    gas  . Latex Itching, Rash and Other (See Comments)    Pt. States latex pulls her skin off.   . Lisinopril Cough  . Metronidazole Other (See Comments)    Unknown   . Sulfa Antibiotics Rash  . Sulfonamide Derivatives Itching and Rash    Past Medical History, Surgical history, Social history, and Family History were reviewed and updated.  Review of Systems: Review of Systems  Constitutional: Negative.   HENT: Negative.   Eyes: Negative.   Respiratory: Positive for shortness of breath.   Cardiovascular: Positive for palpitations.  Gastrointestinal: Negative.   Genitourinary:  Negative.   Musculoskeletal: Negative.   Skin: Negative.   Neurological: Positive for dizziness.  Endo/Heme/Allergies: Negative.   Psychiatric/Behavioral: Negative.       Physical Exam:  weight is 253 lb 6.4 oz (114.9 kg). Her temporal temperature is 97.7 F (36.5 C). Her blood pressure is 135/77 and her pulse is 113 (abnormal). Her respiration is 20 and oxygen saturation is 93%.   Wt Readings from Last 3 Encounters:  04/13/19 253 lb 6.4 oz (114.9 kg)  03/16/19 254 lb (115.2 kg)  02/22/19 254 lb (115.2 kg)  Physical Exam Vitals signs reviewed.  HENT:     Head: Normocephalic and atraumatic.  Eyes:     Pupils: Pupils are equal, round, and reactive to light.  Neck:     Musculoskeletal: Normal range of motion.  Cardiovascular:     Rate and Rhythm: Normal rate and regular rhythm.     Heart sounds: Normal heart sounds.     Comments: Heart rate is rapid and irregular.  This is consistent with atrial fibrillation.  I hear no murmurs. Pulmonary:     Effort: Pulmonary effort is normal.     Breath sounds: Normal breath sounds.     Comments: Lungs sound relatively clear bilaterally.  She may have some occasional wheezing. Abdominal:     General: Bowel sounds are normal.     Palpations: Abdomen is soft.  Musculoskeletal: Normal range of motion.        General: No tenderness or deformity.  Lymphadenopathy:     Cervical: No cervical adenopathy.  Skin:    General: Skin is warm and dry.     Findings: No erythema or rash.  Neurological:     Mental Status: She is alert and oriented to person, place, and time.  Psychiatric:        Behavior: Behavior normal.        Thought Content: Thought content normal.        Judgment: Judgment normal.      Lab Results  Component Value Date   WBC 6.0 04/13/2019   HGB 10.4 (L) 04/13/2019   HCT 32.6 (L) 04/13/2019   MCV 92.6 04/13/2019   PLT 88 (L) 04/13/2019   Lab Results  Component Value Date   FERRITIN 281 10/26/2018   IRON 82  10/26/2018   TIBC 399 10/26/2018   UIBC 317 10/26/2018   IRONPCTSAT 21 10/26/2018   Lab Results  Component Value Date   RETICCTPCT 3.4 (H) 10/26/2018   RBC 3.52 (L) 04/13/2019   RETICCTABS 73.3 01/28/2011   No results found for: KPAFRELGTCHN, LAMBDASER, KAPLAMBRATIO No results found for: IGGSERUM, IGA, IGMSERUM No results found for: Odetta Pink, SPEI   Chemistry      Component Value Date/Time   NA 140 04/13/2019 1405   NA 145 09/08/2017 1317   NA 142 02/25/2017 1013   K 4.9 04/13/2019 1405   K 4.5 09/08/2017 1317   K 3.8 02/25/2017 1013   CL 97 (L) 04/13/2019 1405   CL 98 09/08/2017 1317   CO2 33 (H) 04/13/2019 1405   CO2 32 09/08/2017 1317   CO2 26 02/25/2017 1013   BUN 31 (H) 04/13/2019 1405   BUN 34 (H) 09/08/2017 1317   BUN 20.1 02/25/2017 1013   CREATININE 2.22 (H) 04/13/2019 1405   CREATININE 1.9 (H) 09/08/2017 1317   CREATININE 1.8 (H) 02/25/2017 1013      Component Value Date/Time   CALCIUM 9.1 04/13/2019 1405   CALCIUM 9.3 09/08/2017 1317   CALCIUM 8.7 02/25/2017 1013   ALKPHOS 42 04/13/2019 1405   ALKPHOS 45 09/08/2017 1317   ALKPHOS 65 02/25/2017 1013   AST 17 04/13/2019 1405   AST 16 02/25/2017 1013   ALT 14 04/13/2019 1405   ALT 26 09/08/2017 1317   ALT 13 02/25/2017 1013   BILITOT 0.6 04/13/2019 1405   BILITOT 0.76 02/25/2017 1013       Impression and Plan: Ms. Sharples is a very pleasant 84 yo caucasian female with immune based thrombocytopenia.   Again,  she is responding to Nplate.  I think we does have to be aggressive with the endplate and make sure she gets this weekly.  I will talk to her cardiologist.  I will see what they think about getting her on anticoagulation and maybe trying to cardiovert her since her atrial fibrillation is more of a problem right now.  We will have her come back weekly for her Nplate.  We will check her CBC.  Again, I think we can keep her platelet count up so  that she can be anticoagulated if that is what cardiology feels can be done.   I believe that the risk of her having a cerebrovascular event is higher than her risk of bleeding from thrombocytopenia.  As such, I believe that anticoagulation would be safe for her.  We will plan to get her back to see [place] in another 3 weeks or so.  I spent about 30 minutes with her today.  We had to talk about the choices we have regarding anticoagulation.  She is willing to take anticoagulation to help decrease her risk of stroke.  Volanda Napoleon, MD 8/6/20203:34 PM

## 2019-04-13 NOTE — Telephone Encounter (Signed)
New Message      [name] is calling from Dr Antonieta Pert office and would like for Dr Marlou Porch to call Dr Marin Olp back  Please call

## 2019-04-17 ENCOUNTER — Telehealth: Payer: Self-pay | Admitting: Cardiology

## 2019-04-17 DIAGNOSIS — B351 Tinea unguium: Secondary | ICD-10-CM | POA: Diagnosis not present

## 2019-04-17 DIAGNOSIS — M79674 Pain in right toe(s): Secondary | ICD-10-CM | POA: Diagnosis not present

## 2019-04-17 DIAGNOSIS — E119 Type 2 diabetes mellitus without complications: Secondary | ICD-10-CM | POA: Diagnosis not present

## 2019-04-17 DIAGNOSIS — M79675 Pain in left toe(s): Secondary | ICD-10-CM | POA: Diagnosis not present

## 2019-04-17 NOTE — Telephone Encounter (Signed)
lpmtcb 8/10

## 2019-04-17 NOTE — Telephone Encounter (Signed)
Spoke to Dr. Katheran Awe. He supports DOAC for her. Her PLT's under treatment are stable.   Please start Eliquis 2.5mg  PO BID (dose adjusted for Creat and age) In one month check CBC  Thanks  [name], MD

## 2019-04-18 MED ORDER — APIXABAN 2.5 MG PO TABS: 2.5000 mg | ORAL_TABLET | Freq: Two times a day (BID) | ORAL | 11 refills | Status: DC

## 2019-04-18 NOTE — Telephone Encounter (Signed)
Reviewed plan of care with patient who verbalized agreement She states she sees Dr. Marin Olp weekly and will have her lab work checked by him Patient requested that I contact the nursing station at [facility] ([place] [address] is her building) to give them this information I left message for Gerald Stabs, Chief Executive Officer, at [facility] to call me back so that I can give her this information I answered questions to her satisfaction and she thanked me for the call

## 2019-04-18 NOTE — Telephone Encounter (Signed)
Left messages on home and cell requesting that patient call back in regards to a medication

## 2019-04-19 NOTE — Telephone Encounter (Signed)
Order for Eliquis 2.5 mg BID faxed to [facility] with confirmation received.

## 2019-04-19 NOTE — Telephone Encounter (Signed)
Spoke with Cecile Sheerer, LPN at [facility] and she requests that I fax order to them at [phone]. Direct phone number is [phone]

## 2019-04-20 ENCOUNTER — Inpatient Hospital Stay: Payer: Medicare Other

## 2019-04-20 ENCOUNTER — Other Ambulatory Visit: Payer: Self-pay

## 2019-04-20 VITALS — BP 138/58 | HR 116 | Temp 97.1°F | Resp 20

## 2019-04-20 DIAGNOSIS — Z7901 Long term (current) use of anticoagulants: Secondary | ICD-10-CM | POA: Diagnosis not present

## 2019-04-20 DIAGNOSIS — D693 Immune thrombocytopenic purpura: Secondary | ICD-10-CM

## 2019-04-20 DIAGNOSIS — Z86711 Personal history of pulmonary embolism: Secondary | ICD-10-CM | POA: Diagnosis not present

## 2019-04-20 DIAGNOSIS — I4891 Unspecified atrial fibrillation: Secondary | ICD-10-CM | POA: Diagnosis not present

## 2019-04-20 DIAGNOSIS — D509 Iron deficiency anemia, unspecified: Secondary | ICD-10-CM | POA: Diagnosis not present

## 2019-04-20 LAB — CBC WITH DIFFERENTIAL (CANCER CENTER ONLY)
Abs Immature Granulocytes: 0.05 10*3/uL (ref 0.00–0.07)
Basophils Absolute: 0 10*3/uL (ref 0.0–0.1)
Basophils Relative: 0 %
Eosinophils Absolute: 0.1 10*3/uL (ref 0.0–0.5)
Eosinophils Relative: 2 %
HCT: 33.2 % — ABNORMAL LOW (ref 36.0–46.0)
Hemoglobin: 10.6 g/dL — ABNORMAL LOW (ref 12.0–15.0)
Immature Granulocytes: 1 %
Lymphocytes Relative: 17 %
Lymphs Abs: 1.1 10*3/uL (ref 0.7–4.0)
MCH: 29.4 pg (ref 26.0–34.0)
MCHC: 31.9 g/dL (ref 30.0–36.0)
MCV: 92.2 fL (ref 80.0–100.0)
Monocytes Absolute: 0.4 10*3/uL (ref 0.1–1.0)
Monocytes Relative: 6 %
Neutro Abs: 4.8 10*3/uL (ref 1.7–7.7)
Neutrophils Relative %: 74 %
Platelet Count: 130 10*3/uL — ABNORMAL LOW (ref 150–400)
RBC: 3.6 MIL/uL — ABNORMAL LOW (ref 3.87–5.11)
RDW: 19 % — ABNORMAL HIGH (ref 11.5–15.5)
WBC Count: 6.5 10*3/uL (ref 4.0–10.5)
nRBC: 0 % (ref 0.0–0.2)

## 2019-04-20 LAB — CMP (CANCER CENTER ONLY)
ALT: 13 U/L (ref 0–44)
AST: 20 U/L (ref 15–41)
Albumin: 4.2 g/dL (ref 3.5–5.0)
Alkaline Phosphatase: 45 U/L (ref 38–126)
Anion gap: 10 (ref 5–15)
BUN: 34 mg/dL — ABNORMAL HIGH (ref 8–23)
CO2: 33 mmol/L — ABNORMAL HIGH (ref 22–32)
Calcium: 9.5 mg/dL (ref 8.9–10.3)
Chloride: 100 mmol/L (ref 98–111)
Creatinine: 2.45 mg/dL — ABNORMAL HIGH (ref 0.44–1.00)
GFR, Est AFR Am: 21 mL/min — ABNORMAL LOW (ref >60)
GFR, Estimated: 18 mL/min — ABNORMAL LOW (ref >60)
Glucose, Bld: 116 mg/dL — ABNORMAL HIGH (ref 70–99)
Potassium: 4.3 mmol/L (ref 3.5–5.1)
Sodium: 143 mmol/L (ref 135–145)
Total Bilirubin: 0.6 mg/dL (ref 0.3–1.2)
Total Protein: 7.1 g/dL (ref 6.5–8.1)

## 2019-04-20 MED ORDER — ROMIPLOSTIM INJECTION 500 MCG
920.0000 ug | Freq: Once | SUBCUTANEOUS | Status: AC
  Administered 2019-04-20: 920 ug via SUBCUTANEOUS
  Filled 2019-04-20: qty 0.84

## 2019-04-22 DIAGNOSIS — I480 Paroxysmal atrial fibrillation: Secondary | ICD-10-CM | POA: Diagnosis not present

## 2019-04-22 DIAGNOSIS — D693 Immune thrombocytopenic purpura: Secondary | ICD-10-CM | POA: Diagnosis not present

## 2019-04-22 DIAGNOSIS — E1122 Type 2 diabetes mellitus with diabetic chronic kidney disease: Secondary | ICD-10-CM | POA: Diagnosis not present

## 2019-04-22 DIAGNOSIS — N184 Chronic kidney disease, stage 4 (severe): Secondary | ICD-10-CM | POA: Diagnosis not present

## 2019-04-22 DIAGNOSIS — Z794 Long term (current) use of insulin: Secondary | ICD-10-CM | POA: Diagnosis not present

## 2019-04-22 DIAGNOSIS — G4733 Obstructive sleep apnea (adult) (pediatric): Secondary | ICD-10-CM | POA: Diagnosis not present

## 2019-04-27 ENCOUNTER — Inpatient Hospital Stay: Payer: Medicare Other

## 2019-04-27 ENCOUNTER — Other Ambulatory Visit: Payer: Self-pay

## 2019-04-27 VITALS — BP 162/55 | HR 84 | Temp 97.1°F

## 2019-04-27 DIAGNOSIS — Z86711 Personal history of pulmonary embolism: Secondary | ICD-10-CM | POA: Diagnosis not present

## 2019-04-27 DIAGNOSIS — D693 Immune thrombocytopenic purpura: Secondary | ICD-10-CM | POA: Diagnosis not present

## 2019-04-27 DIAGNOSIS — Z7901 Long term (current) use of anticoagulants: Secondary | ICD-10-CM | POA: Diagnosis not present

## 2019-04-27 DIAGNOSIS — I4891 Unspecified atrial fibrillation: Secondary | ICD-10-CM | POA: Diagnosis not present

## 2019-04-27 DIAGNOSIS — D509 Iron deficiency anemia, unspecified: Secondary | ICD-10-CM | POA: Diagnosis not present

## 2019-04-27 LAB — CBC WITH DIFFERENTIAL (CANCER CENTER ONLY)
Abs Immature Granulocytes: 0.08 10*3/uL — ABNORMAL HIGH (ref 0.00–0.07)
Basophils Absolute: 0 10*3/uL (ref 0.0–0.1)
Basophils Relative: 0 %
Eosinophils Absolute: 0.2 10*3/uL (ref 0.0–0.5)
Eosinophils Relative: 2 %
HCT: 32.1 % — ABNORMAL LOW (ref 36.0–46.0)
Hemoglobin: 10.2 g/dL — ABNORMAL LOW (ref 12.0–15.0)
Immature Granulocytes: 1 %
Lymphocytes Relative: 14 %
Lymphs Abs: 1 10*3/uL (ref 0.7–4.0)
MCH: 29.7 pg (ref 26.0–34.0)
MCHC: 31.8 g/dL (ref 30.0–36.0)
MCV: 93.3 fL (ref 80.0–100.0)
Monocytes Absolute: 0.5 10*3/uL (ref 0.1–1.0)
Monocytes Relative: 8 %
Neutro Abs: 5.2 10*3/uL (ref 1.7–7.7)
Neutrophils Relative %: 75 %
Platelet Count: 242 10*3/uL (ref 150–400)
RBC: 3.44 MIL/uL — ABNORMAL LOW (ref 3.87–5.11)
RDW: 19.4 % — ABNORMAL HIGH (ref 11.5–15.5)
WBC Count: 7 10*3/uL (ref 4.0–10.5)
nRBC: 0 % (ref 0.0–0.2)

## 2019-04-27 LAB — CMP (CANCER CENTER ONLY)
ALT: 13 U/L (ref 0–44)
AST: 18 U/L (ref 15–41)
Albumin: 4.1 g/dL (ref 3.5–5.0)
Alkaline Phosphatase: 41 U/L (ref 38–126)
Anion gap: 9 (ref 5–15)
BUN: 33 mg/dL — ABNORMAL HIGH (ref 8–23)
CO2: 32 mmol/L (ref 22–32)
Calcium: 9.2 mg/dL (ref 8.9–10.3)
Chloride: 100 mmol/L (ref 98–111)
Creatinine: 2.41 mg/dL — ABNORMAL HIGH (ref 0.44–1.00)
GFR, Est AFR Am: 21 mL/min — ABNORMAL LOW (ref >60)
GFR, Estimated: 18 mL/min — ABNORMAL LOW (ref >60)
Glucose, Bld: 151 mg/dL — ABNORMAL HIGH (ref 70–99)
Potassium: 4.7 mmol/L (ref 3.5–5.1)
Sodium: 141 mmol/L (ref 135–145)
Total Bilirubin: 0.5 mg/dL (ref 0.3–1.2)
Total Protein: 6.8 g/dL (ref 6.5–8.1)

## 2019-04-27 MED ORDER — ROMIPLOSTIM INJECTION 500 MCG
400.0000 ug | Freq: Once | SUBCUTANEOUS | Status: AC
  Administered 2019-04-27: 16:00:00 400 ug via SUBCUTANEOUS
  Filled 2019-04-27: qty 0.8

## 2019-04-27 NOTE — Patient Instructions (Signed)
Romiplostim injection What is this medicine? ROMIPLOSTIM (roe mi PLOE stim) helps your body make more platelets. This medicine is used to treat low platelets caused by chronic idiopathic thrombocytopenic purpura (ITP). This medicine may be used for other purposes; ask your health care provider or pharmacist if you have questions. COMMON BRAND NAME(S): Nplate What should I tell my health care provider before I take this medicine? They need to know if you have any of these conditions:  bleeding disorders  bone marrow problem, like blood cancer or myelodysplastic syndrome  history of blood clots  liver disease  surgery to remove your spleen  an unusual or allergic reaction to romiplostim, mannitol, other medicines, foods, dyes, or preservatives  pregnant or trying to get pregnant  breast-feeding How should I use this medicine? This medicine is for injection under the skin. It is given by a health care professional in a hospital or clinic setting. A special MedGuide will be given to you before your injection. Read this information carefully each time. Talk to your pediatrician regarding the use of this medicine in children. While this drug may be prescribed for children as young as 1 year for selected conditions, precautions do apply. Overdosage: If you think you have taken too much of this medicine contact a poison control center or emergency room at once. NOTE: This medicine is only for you. Do not share this medicine with others. What if I miss a dose? It is important not to miss your dose. Call your doctor or health care professional if you are unable to keep an appointment. What may interact with this medicine? Interactions are not expected. This list may not describe all possible interactions. Give your health care provider a list of all the medicines, herbs, non-prescription drugs, or dietary supplements you use. Also tell them if you smoke, drink alcohol, or use illegal drugs.  Some items may interact with your medicine. What should I watch for while using this medicine? Your condition will be monitored carefully while you are receiving this medicine. Visit your prescriber or health care professional for regular checks on your progress and for the needed blood tests. It is important to keep all appointments. What side effects may I notice from receiving this medicine? Side effects that you should report to your doctor or health care professional as soon as possible:  allergic reactions like skin rash, itching or hives, swelling of the face, lips, or tongue  signs and symptoms of bleeding such as bloody or black, tarry stools; red or dark brown urine; spitting up blood or brown material that looks like coffee grounds; red spots on the skin; unusual bruising or bleeding from the eyes, gums, or nose  signs and symptoms of a blood clot such as chest pain; shortness of breath; pain, swelling, or warmth in the leg  signs and symptoms of a stroke like changes in vision; confusion; trouble speaking or understanding; severe headaches; sudden numbness or weakness of the face, arm or leg; trouble walking; dizziness; loss of balance or coordination Side effects that usually do not require medical attention (report to your doctor or health care professional if they continue or are bothersome):  headache  pain in arms and legs  pain in mouth  stomach pain This list may not describe all possible side effects. Call your doctor for medical advice about side effects. You may report side effects to FDA at 1-800-FDA-1088. Where should I keep my medicine? This drug is given in a hospital or clinic   and will not be stored at home. NOTE: This sheet is a summary. It may not cover all possible information. If you have questions about this medicine, talk to your doctor, pharmacist, or health care provider.  2020 [employer]/Gold Standard (2017-08-23 11:10:55)  

## 2019-04-27 NOTE — Progress Notes (Signed)
PLTC = 242 today. Give NPlate 400 mcg (3.5 mcg/kg) today per Dr. Marin Olp.

## 2019-05-02 ENCOUNTER — Telehealth: Payer: Self-pay

## 2019-05-02 NOTE — Telephone Encounter (Signed)
The box is from [place] and it is addressed to the patient at her Assisted living residence. It is unclear as to how and why it ended up in our clinic.  There is not any documentation in her chart regarding this. The box contains one new unopened Libre reader device and 3 boxes of sensors. Two of the three expired in March of this year and the remaining box of sensors expire next month.  I called the patient to inquire about this and she informed me that she has been having issues getting her sensor to stick and problems with the reader. Patient called [facility] and they let her know they would send her a new reader and sensors, and to have it sent to her living facility. Patient does not know why it ended up here, but she will have her daughter stop by and pick up the reader and one box that is not expired.

## 2019-05-02 NOTE — Telephone Encounter (Signed)
-----   Message from [facility] sent at 05/02/2019 12:37 PM EDT ----- Regarding: RE: [employer] supplies What is the status of this request? ----- Message ----- From: Jayme Cloud, LPN Sent: 04/24/5908  10:52 AM EDT To: Ocie Doyne, RN, [facility], # Subject: [employer] supplies                                 [place], a few months ago, I gave you a box of freestyle [employer] supplies that belonged to a patient that were delivered here. When the box was opened, the address to a nursing facility with the pt's name on it, and I gave them to you and you said you would get it taken care of. I figured you knew why they may have been delivered here, rather than directly to the patient.  Today, I noticed that the box of libre supplies were sitting at the back of the office opposite the fax machine. Can you tell me the status of the patient getting these supplies? I do not see any documentation in her chart regarding this.  Thanks, [employer]

## 2019-05-04 ENCOUNTER — Inpatient Hospital Stay: Payer: Medicare Other

## 2019-05-04 ENCOUNTER — Other Ambulatory Visit: Payer: Self-pay

## 2019-05-04 ENCOUNTER — Encounter: Payer: Self-pay | Admitting: Hematology & Oncology

## 2019-05-04 ENCOUNTER — Inpatient Hospital Stay (HOSPITAL_BASED_OUTPATIENT_CLINIC_OR_DEPARTMENT_OTHER): Payer: Medicare Other | Admitting: Hematology & Oncology

## 2019-05-04 VITALS — BP 130/58 | HR 89 | Temp 97.3°F | Resp 18 | Ht 60.0 in | Wt 255.1 lb

## 2019-05-04 DIAGNOSIS — N184 Chronic kidney disease, stage 4 (severe): Secondary | ICD-10-CM | POA: Diagnosis not present

## 2019-05-04 DIAGNOSIS — D693 Immune thrombocytopenic purpura: Secondary | ICD-10-CM

## 2019-05-04 DIAGNOSIS — D509 Iron deficiency anemia, unspecified: Secondary | ICD-10-CM | POA: Diagnosis not present

## 2019-05-04 DIAGNOSIS — I4891 Unspecified atrial fibrillation: Secondary | ICD-10-CM | POA: Diagnosis not present

## 2019-05-04 DIAGNOSIS — Z7901 Long term (current) use of anticoagulants: Secondary | ICD-10-CM | POA: Diagnosis not present

## 2019-05-04 DIAGNOSIS — Z86711 Personal history of pulmonary embolism: Secondary | ICD-10-CM | POA: Diagnosis not present

## 2019-05-04 LAB — CBC WITH DIFFERENTIAL (CANCER CENTER ONLY)
Abs Immature Granulocytes: 0.07 10*3/uL (ref 0.00–0.07)
Basophils Absolute: 0 10*3/uL (ref 0.0–0.1)
Basophils Relative: 0 %
Eosinophils Absolute: 0.1 10*3/uL (ref 0.0–0.5)
Eosinophils Relative: 2 %
HCT: 30.9 % — ABNORMAL LOW (ref 36.0–46.0)
Hemoglobin: 9.6 g/dL — ABNORMAL LOW (ref 12.0–15.0)
Immature Granulocytes: 1 %
Lymphocytes Relative: 21 %
Lymphs Abs: 1.1 10*3/uL (ref 0.7–4.0)
MCH: 29.4 pg (ref 26.0–34.0)
MCHC: 31.1 g/dL (ref 30.0–36.0)
MCV: 94.5 fL (ref 80.0–100.0)
Monocytes Absolute: 0.5 10*3/uL (ref 0.1–1.0)
Monocytes Relative: 9 %
Neutro Abs: 3.3 10*3/uL (ref 1.7–7.7)
Neutrophils Relative %: 67 %
Platelet Count: 201 10*3/uL (ref 150–400)
RBC: 3.27 MIL/uL — ABNORMAL LOW (ref 3.87–5.11)
RDW: 19.1 % — ABNORMAL HIGH (ref 11.5–15.5)
WBC Count: 5 10*3/uL (ref 4.0–10.5)
nRBC: 0 % (ref 0.0–0.2)

## 2019-05-04 LAB — RETICULOCYTES
Immature Retic Fract: 23.4 % — ABNORMAL HIGH (ref 2.3–15.9)
RBC.: 3.3 MIL/uL — ABNORMAL LOW (ref 3.87–5.11)
Retic Count, Absolute: 100.3 10*3/uL (ref 19.0–186.0)
Retic Ct Pct: 3 % (ref 0.4–3.1)

## 2019-05-04 LAB — CMP (CANCER CENTER ONLY)
ALT: 12 U/L (ref 0–44)
AST: 19 U/L (ref 15–41)
Albumin: 4.2 g/dL (ref 3.5–5.0)
Alkaline Phosphatase: 43 U/L (ref 38–126)
Anion gap: 8 (ref 5–15)
BUN: 31 mg/dL — ABNORMAL HIGH (ref 8–23)
CO2: 33 mmol/L — ABNORMAL HIGH (ref 22–32)
Calcium: 8.8 mg/dL — ABNORMAL LOW (ref 8.9–10.3)
Chloride: 102 mmol/L (ref 98–111)
Creatinine: 2.46 mg/dL — ABNORMAL HIGH (ref 0.44–1.00)
GFR, Est AFR Am: 21 mL/min — ABNORMAL LOW (ref >60)
GFR, Estimated: 18 mL/min — ABNORMAL LOW (ref >60)
Glucose, Bld: 78 mg/dL (ref 70–99)
Potassium: 4.4 mmol/L (ref 3.5–5.1)
Sodium: 143 mmol/L (ref 135–145)
Total Bilirubin: 0.4 mg/dL (ref 0.3–1.2)
Total Protein: 7.2 g/dL (ref 6.5–8.1)

## 2019-05-04 MED ORDER — ROMIPLOSTIM INJECTION 500 MCG
400.0000 ug | Freq: Once | SUBCUTANEOUS | Status: AC
  Administered 2019-05-04: 16:00:00 400 ug via SUBCUTANEOUS
  Filled 2019-05-04: qty 0.8

## 2019-05-04 NOTE — Progress Notes (Signed)
Hematology and Oncology Follow Up Visit  [name] [id number] Jan 21, 1935 84 y.o. 05/04/2019   Principle Diagnosis:  Immune based thrombocytopenia History of PE Iron deficiency anemia Atrial fib  Current Therapy:   Nplate q week for platelet count < 100K IV Iron as indicated  Eliquis 2.5 mg po BID   Interim History:  [name] is here today for follow-up.  So far, she is done well with the Nplate.  She has had no issues with respect to bleeding.  She is now on Eliquis  for the atrial fibrillation.  I think this is definitely a good idea for her.  I think this will definitely help with her minimizing the risk of stroke.  I do think that we should continue her on the Nplate.  Her platelet count today is quite good.  However, I do think that we need to keep maintaining the platelet count.  She is little bit more anemic.  I suspect she probably has renal insufficiency.  I think the renal insufficiency is probably leading to decreased erythropoietin level.  There is been no problems with her diabetes.  Blood sugar today is actually only 78.  We probably need give her a little something to eat today.  She has had no problems with her bowels or bladder.  She is had some leg swelling.  This is been chronic.  Her legs are wrapped.  Overall, her performance status is ECOG 1.  Medications:  Allergies as of 05/04/2019      Reactions   Apixaban Other (See Comments)   Internal Bleeding   Aspirin Itching, Rash, Hives, Swelling   Swelling of her tongue   Mirabegron Other (See Comments)   Patient experienced A-Fib   Metformin Diarrhea, Nausea Only   Pineapple Swelling   Throat swells and blisters on tongue and roof of mouth per patient   Tetracycline Hives   Fluticasone-salmeterol Itching, Rash   Iodinated Diagnostic Agents Itching, Rash      Lactose Intolerance (gi) Other (See Comments)   gas   Latex Itching, Rash, Other (See Comments)   Pt. States latex pulls her skin off.     Lisinopril Cough   Metronidazole Other (See Comments)   Unknown   Sulfa Antibiotics Rash   Sulfonamide Derivatives Itching, Rash      Medication List       Accurate as of May 04, 2019  3:18 PM. If you have any questions, ask your nurse or doctor.        acetaminophen 325 MG tablet Commonly known as: TYLENOL Take 650 mg by mouth every 6 (six) hours as needed.   apixaban 2.5 MG Tabs tablet Commonly known as: ELIQUIS Take 1 tablet (2.5 mg total) by mouth 2 (two) times daily.   atorvastatin 40 MG tablet Commonly known as: LIPITOR Take 40 mg by mouth daily.   Banophen 25 MG tablet Generic drug: diphenhydrAMINE Take 25 mg by mouth daily.   bisacodyl 5 MG EC tablet Commonly known as: DULCOLAX Take 5 mg by mouth daily as needed for moderate constipation. As needed every three days   CALCIUM 1200 PO Take 1 tablet by mouth daily.   diltiazem 180 MG 24 hr capsule Commonly known as: DILACOR XR Take 180 mg by mouth daily.   diltiazem 180 MG 24 hr capsule Commonly known as: CARDIZEM CD Take 1 capsule (180 mg total) by mouth daily.   docusate sodium 100 MG capsule Commonly known as: COLACE Take 1 capsule by mouth  daily.   Dulaglutide 1.5 MG/0.5ML Sopn Commonly known as: Trulicity Inject 1.5 mg into the skin once a week.   fenofibrate 54 MG tablet TAKE 1 TABLET(54 MG) BY MOUTH DAILY   fexofenadine 180 MG tablet Commonly known as: ALLEGRA Take 180 mg by mouth daily.   Flaxseed Oil 1000 MG Caps Take by mouth 2 (two) times daily.   furosemide 40 MG tablet Commonly known as: LASIX 40 mg daily. Takes 20 some days and others take 40   HumaLOG 100 UNIT/ML injection Generic drug: insulin lispro Inject 35 Units into the skin 3 (three) times daily with meals. 04/13/2019 On Sliding Scale.   insulin glargine 100 UNIT/ML injection Commonly known as: Lantus Inject 0.4 mLs (40 Units total) into the skin daily. What changed: when to take this   Insulin Pen Needle 31G X  5 MM Misc Commonly known as: B-D UF III MINI PEN NEEDLES Use daily for insulin injection   INSULIN SYRINGE 1CC/30GX5/16" 30G X 5/16" 1 ML Misc USE BID UTD   levothyroxine 112 MCG tablet Commonly known as: SYNTHROID TAKE 1 TABLET BY MOUTH DAILY   multivitamin tablet Take 1 tablet by mouth daily.   omeprazole 20 MG capsule Commonly known as: PRILOSEC   ondansetron 4 MG tablet Commonly known as: ZOFRAN Take 4 mg by mouth every 8 (eight) hours as needed for nausea or vomiting.   potassium citrate 10 MEQ (1080 MG) SR tablet Commonly known as: UROCIT-K TK 2 TABLETS PO BID WITH MEALS   PROBIOTIC PO Take 1 capsule by mouth daily.   ranitidine 150 MG tablet Commonly known as: ZANTAC   sertraline 100 MG tablet Commonly known as: ZOLOFT Take 1 tablet by mouth daily.   triamcinolone cream 0.1 % Commonly known as: KENALOG   vitamin C 250 MG tablet Commonly known as: ASCORBIC ACID Take 250 mg by mouth daily.   Vitamin D3 50 MCG (2000 UT) Tabs Take 2,000 Units by mouth daily.       Allergies:  Allergies  Allergen Reactions  . Apixaban Other (See Comments)    Internal Bleeding  . Aspirin Itching, Rash, Hives and Swelling    Swelling of her tongue  . Mirabegron Other (See Comments)    Patient experienced A-Fib  . Metformin Diarrhea and Nausea Only  . Pineapple Swelling    Throat swells and blisters on tongue and roof of mouth per patient  . Tetracycline Hives  . Fluticasone-Salmeterol Itching and Rash  . Iodinated Diagnostic Agents Itching and Rash       . Lactose Intolerance (Gi) Other (See Comments)    gas  . Latex Itching, Rash and Other (See Comments)    Pt. States latex pulls her skin off.   . Lisinopril Cough  . Metronidazole Other (See Comments)    Unknown   . Sulfa Antibiotics Rash  . Sulfonamide Derivatives Itching and Rash    Past Medical History, Surgical history, Social history, and Family History were reviewed and updated.  Review of Systems:  Review of Systems  Constitutional: Negative.   HENT: Negative.   Eyes: Negative.   Respiratory: Positive for shortness of breath.   Cardiovascular: Positive for palpitations.  Gastrointestinal: Negative.   Genitourinary: Negative.   Musculoskeletal: Negative.   Skin: Negative.   Neurological: Positive for dizziness.  Endo/Heme/Allergies: Negative.   Psychiatric/Behavioral: Negative.       Physical Exam:  height is 5' (1.524 m) and weight is 255 lb 1.9 oz (115.7 kg). Her temporal temperature is  97.3 F (36.3 C) (abnormal). Her blood pressure is 130/58 (abnormal) and her pulse is 89. Her respiration is 18 and oxygen saturation is 95%.   Wt Readings from Last 3 Encounters:  05/04/19 255 lb 1.9 oz (115.7 kg)  03/16/19 254 lb (115.2 kg)  02/22/19 254 lb (115.2 kg)    Physical Exam Vitals signs reviewed.  HENT:     Head: Normocephalic and atraumatic.  Eyes:     Pupils: Pupils are equal, round, and reactive to light.  Neck:     Musculoskeletal: Normal range of motion.  Cardiovascular:     Rate and Rhythm: Normal rate and regular rhythm.     Heart sounds: Normal heart sounds.     Comments: Heart rate is rapid and irregular.  This is consistent with atrial fibrillation.  I hear no murmurs. Pulmonary:     Effort: Pulmonary effort is normal.     Breath sounds: Normal breath sounds.     Comments: Lungs sound relatively clear bilaterally.  She may have some occasional wheezing. Abdominal:     General: Bowel sounds are normal.     Palpations: Abdomen is soft.  Musculoskeletal: Normal range of motion.        General: No tenderness or deformity.  Lymphadenopathy:     Cervical: No cervical adenopathy.  Skin:    General: Skin is warm and dry.     Findings: No erythema or rash.  Neurological:     Mental Status: She is alert and oriented to person, place, and time.  Psychiatric:        Behavior: Behavior normal.        Thought Content: Thought content normal.        Judgment:  Judgment normal.      Lab Results  Component Value Date   WBC 5.0 05/04/2019   HGB 9.6 (L) 05/04/2019   HCT 30.9 (L) 05/04/2019   MCV 94.5 05/04/2019   PLT 201 05/04/2019   Lab Results  Component Value Date   FERRITIN 281 10/26/2018   IRON 82 10/26/2018   TIBC 399 10/26/2018   UIBC 317 10/26/2018   IRONPCTSAT 21 10/26/2018   Lab Results  Component Value Date   RETICCTPCT 3.0 05/04/2019   RBC 3.30 (L) 05/04/2019   RETICCTABS 73.3 01/28/2011   No results found for: KPAFRELGTCHN, LAMBDASER, KAPLAMBRATIO No results found for: IGGSERUM, IGA, IGMSERUM No results found for: Odetta Pink, SPEI   Chemistry      Component Value Date/Time   NA 143 05/04/2019 1432   NA 145 09/08/2017 1317   NA 142 02/25/2017 1013   K 4.4 05/04/2019 1432   K 4.5 09/08/2017 1317   K 3.8 02/25/2017 1013   CL 102 05/04/2019 1432   CL 98 09/08/2017 1317   CO2 33 (H) 05/04/2019 1432   CO2 32 09/08/2017 1317   CO2 26 02/25/2017 1013   BUN 31 (H) 05/04/2019 1432   BUN 34 (H) 09/08/2017 1317   BUN 20.1 02/25/2017 1013   CREATININE 2.46 (H) 05/04/2019 1432   CREATININE 1.9 (H) 09/08/2017 1317   CREATININE 1.8 (H) 02/25/2017 1013      Component Value Date/Time   CALCIUM 8.8 (L) 05/04/2019 1432   CALCIUM 9.3 09/08/2017 1317   CALCIUM 8.7 02/25/2017 1013   ALKPHOS 43 05/04/2019 1432   ALKPHOS 45 09/08/2017 1317   ALKPHOS 65 02/25/2017 1013   AST 19 05/04/2019 1432   AST 16 02/25/2017 1013  ALT 12 05/04/2019 1432   ALT 26 09/08/2017 1317   ALT 13 02/25/2017 1013   BILITOT 0.4 05/04/2019 1432   BILITOT 0.76 02/25/2017 1013       Impression and Plan: Ms. Mcgrory is a very pleasant 84 yo caucasian female with immune based thrombocytopenia.   Again, she is responding to Nplate.  We will give her a dose of Nplate today.  This will maintain her platelet count.  We do have to watch her anemia.  I do think that her renal insufficiency is  probably the source of her anemia.  We will have to see what her erythropoietin level is.  We will continue to have her come in weekly.  Maybe, we can be a little more liberal with having to hold the Nplate.  I will see her back in another 4 weeks.    Volanda Napoleon, MD 8/27/20203:18 PM

## 2019-05-04 NOTE — Patient Instructions (Signed)
Romiplostim injection What is this medicine? ROMIPLOSTIM (roe mi PLOE stim) helps your body make more platelets. This medicine is used to treat low platelets caused by chronic idiopathic thrombocytopenic purpura (ITP). This medicine may be used for other purposes; ask your health care provider or pharmacist if you have questions. COMMON BRAND NAME(S): Nplate What should I tell my health care provider before I take this medicine? They need to know if you have any of these conditions:  bleeding disorders  bone marrow problem, like blood cancer or myelodysplastic syndrome  history of blood clots  liver disease  surgery to remove your spleen  an unusual or allergic reaction to romiplostim, mannitol, other medicines, foods, dyes, or preservatives  pregnant or trying to get pregnant  breast-feeding How should I use this medicine? This medicine is for injection under the skin. It is given by a health care professional in a hospital or clinic setting. A special MedGuide will be given to you before your injection. Read this information carefully each time. Talk to your pediatrician regarding the use of this medicine in children. While this drug may be prescribed for children as young as 1 year for selected conditions, precautions do apply. Overdosage: If you think you have taken too much of this medicine contact a poison control center or emergency room at once. NOTE: This medicine is only for you. Do not share this medicine with others. What if I miss a dose? It is important not to miss your dose. Call your doctor or health care professional if you are unable to keep an appointment. What may interact with this medicine? Interactions are not expected. This list may not describe all possible interactions. Give your health care provider a list of all the medicines, herbs, non-prescription drugs, or dietary supplements you use. Also tell them if you smoke, drink alcohol, or use illegal drugs.  Some items may interact with your medicine. What should I watch for while using this medicine? Your condition will be monitored carefully while you are receiving this medicine. Visit your prescriber or health care professional for regular checks on your progress and for the needed blood tests. It is important to keep all appointments. What side effects may I notice from receiving this medicine? Side effects that you should report to your doctor or health care professional as soon as possible:  allergic reactions like skin rash, itching or hives, swelling of the face, lips, or tongue  signs and symptoms of bleeding such as bloody or black, tarry stools; red or dark brown urine; spitting up blood or brown material that looks like coffee grounds; red spots on the skin; unusual bruising or bleeding from the eyes, gums, or nose  signs and symptoms of a blood clot such as chest pain; shortness of breath; pain, swelling, or warmth in the leg  signs and symptoms of a stroke like changes in vision; confusion; trouble speaking or understanding; severe headaches; sudden numbness or weakness of the face, arm or leg; trouble walking; dizziness; loss of balance or coordination Side effects that usually do not require medical attention (report to your doctor or health care professional if they continue or are bothersome):  headache  pain in arms and legs  pain in mouth  stomach pain This list may not describe all possible side effects. Call your doctor for medical advice about side effects. You may report side effects to FDA at 1-800-FDA-1088. Where should I keep my medicine? This drug is given in a hospital or clinic   and will not be stored at home. NOTE: This sheet is a summary. It may not cover all possible information. If you have questions about this medicine, talk to your doctor, pharmacist, or health care provider.  2020 [employer]/Gold Standard (2017-08-23 11:10:55)  

## 2019-05-05 LAB — IRON AND TIBC
Iron: 65 ug/dL (ref 41–142)
Saturation Ratios: 17 % — ABNORMAL LOW (ref 21–57)
TIBC: 371 ug/dL (ref 236–444)
UIBC: 307 ug/dL (ref 120–384)

## 2019-05-05 LAB — FERRITIN: Ferritin: 47 ng/mL (ref 11–307)

## 2019-05-11 ENCOUNTER — Inpatient Hospital Stay: Payer: Medicare Other

## 2019-05-11 ENCOUNTER — Other Ambulatory Visit: Payer: Self-pay

## 2019-05-11 ENCOUNTER — Inpatient Hospital Stay: Payer: Medicare Other | Attending: Hematology & Oncology

## 2019-05-11 VITALS — BP 162/63 | HR 82 | Temp 97.1°F | Resp 20

## 2019-05-11 DIAGNOSIS — D693 Immune thrombocytopenic purpura: Secondary | ICD-10-CM

## 2019-05-11 DIAGNOSIS — N184 Chronic kidney disease, stage 4 (severe): Secondary | ICD-10-CM | POA: Diagnosis not present

## 2019-05-11 DIAGNOSIS — D696 Thrombocytopenia, unspecified: Secondary | ICD-10-CM

## 2019-05-11 LAB — CBC WITH DIFFERENTIAL (CANCER CENTER ONLY)
Abs Immature Granulocytes: 0.08 10*3/uL — ABNORMAL HIGH (ref 0.00–0.07)
Basophils Absolute: 0 10*3/uL (ref 0.0–0.1)
Basophils Relative: 0 %
Eosinophils Absolute: 0.1 10*3/uL (ref 0.0–0.5)
Eosinophils Relative: 2 %
HCT: 32.8 % — ABNORMAL LOW (ref 36.0–46.0)
Hemoglobin: 10.4 g/dL — ABNORMAL LOW (ref 12.0–15.0)
Immature Granulocytes: 1 %
Lymphocytes Relative: 15 %
Lymphs Abs: 1 10*3/uL (ref 0.7–4.0)
MCH: 29.8 pg (ref 26.0–34.0)
MCHC: 31.7 g/dL (ref 30.0–36.0)
MCV: 94 fL (ref 80.0–100.0)
Monocytes Absolute: 0.6 10*3/uL (ref 0.1–1.0)
Monocytes Relative: 8 %
Neutro Abs: 5 10*3/uL (ref 1.7–7.7)
Neutrophils Relative %: 74 %
Platelet Count: 141 10*3/uL — ABNORMAL LOW (ref 150–400)
RBC: 3.49 MIL/uL — ABNORMAL LOW (ref 3.87–5.11)
RDW: 19.1 % — ABNORMAL HIGH (ref 11.5–15.5)
WBC Count: 6.8 10*3/uL (ref 4.0–10.5)
nRBC: 0 % (ref 0.0–0.2)

## 2019-05-11 LAB — CMP (CANCER CENTER ONLY)
ALT: 11 U/L (ref 0–44)
AST: 18 U/L (ref 15–41)
Albumin: 4.4 g/dL (ref 3.5–5.0)
Alkaline Phosphatase: 42 U/L (ref 38–126)
Anion gap: 11 (ref 5–15)
BUN: 33 mg/dL — ABNORMAL HIGH (ref 8–23)
CO2: 32 mmol/L (ref 22–32)
Calcium: 9.7 mg/dL (ref 8.9–10.3)
Chloride: 99 mmol/L (ref 98–111)
Creatinine: 2.51 mg/dL — ABNORMAL HIGH (ref 0.44–1.00)
GFR, Est AFR Am: 20 mL/min — ABNORMAL LOW (ref >60)
GFR, Estimated: 17 mL/min — ABNORMAL LOW (ref >60)
Glucose, Bld: 189 mg/dL — ABNORMAL HIGH (ref 70–99)
Potassium: 4.3 mmol/L (ref 3.5–5.1)
Sodium: 142 mmol/L (ref 135–145)
Total Bilirubin: 0.6 mg/dL (ref 0.3–1.2)
Total Protein: 7.5 g/dL (ref 6.5–8.1)

## 2019-05-11 MED ORDER — ROMIPLOSTIM INJECTION 500 MCG
400.0000 ug | Freq: Once | SUBCUTANEOUS | Status: AC
  Administered 2019-05-11: 400 ug via SUBCUTANEOUS
  Filled 2019-05-11: qty 0.8

## 2019-05-11 NOTE — Patient Instructions (Signed)
Romiplostim injection What is this medicine? ROMIPLOSTIM (roe mi PLOE stim) helps your body make more platelets. This medicine is used to treat low platelets caused by chronic idiopathic thrombocytopenic purpura (ITP). This medicine may be used for other purposes; ask your health care provider or pharmacist if you have questions. COMMON BRAND NAME(S): Nplate What should I tell my health care provider before I take this medicine? They need to know if you have any of these conditions:  bleeding disorders  bone marrow problem, like blood cancer or myelodysplastic syndrome  history of blood clots  liver disease  surgery to remove your spleen  an unusual or allergic reaction to romiplostim, mannitol, other medicines, foods, dyes, or preservatives  pregnant or trying to get pregnant  breast-feeding How should I use this medicine? This medicine is for injection under the skin. It is given by a health care professional in a hospital or clinic setting. A special MedGuide will be given to you before your injection. Read this information carefully each time. Talk to your pediatrician regarding the use of this medicine in children. While this drug may be prescribed for children as young as 1 year for selected conditions, precautions do apply. Overdosage: If you think you have taken too much of this medicine contact a poison control center or emergency room at once. NOTE: This medicine is only for you. Do not share this medicine with others. What if I miss a dose? It is important not to miss your dose. Call your doctor or health care professional if you are unable to keep an appointment. What may interact with this medicine? Interactions are not expected. This list may not describe all possible interactions. Give your health care provider a list of all the medicines, herbs, non-prescription drugs, or dietary supplements you use. Also tell them if you smoke, drink alcohol, or use illegal drugs.  Some items may interact with your medicine. What should I watch for while using this medicine? Your condition will be monitored carefully while you are receiving this medicine. Visit your prescriber or health care professional for regular checks on your progress and for the needed blood tests. It is important to keep all appointments. What side effects may I notice from receiving this medicine? Side effects that you should report to your doctor or health care professional as soon as possible:  allergic reactions like skin rash, itching or hives, swelling of the face, lips, or tongue  signs and symptoms of bleeding such as bloody or black, tarry stools; red or dark brown urine; spitting up blood or brown material that looks like coffee grounds; red spots on the skin; unusual bruising or bleeding from the eyes, gums, or nose  signs and symptoms of a blood clot such as chest pain; shortness of breath; pain, swelling, or warmth in the leg  signs and symptoms of a stroke like changes in vision; confusion; trouble speaking or understanding; severe headaches; sudden numbness or weakness of the face, arm or leg; trouble walking; dizziness; loss of balance or coordination Side effects that usually do not require medical attention (report to your doctor or health care professional if they continue or are bothersome):  headache  pain in arms and legs  pain in mouth  stomach pain This list may not describe all possible side effects. Call your doctor for medical advice about side effects. You may report side effects to FDA at 1-800-FDA-1088. Where should I keep my medicine? This drug is given in a hospital or clinic   and will not be stored at home. NOTE: This sheet is a summary. It may not cover all possible information. If you have questions about this medicine, talk to your doctor, pharmacist, or health care provider.  2020 [employer]/Gold Standard (2017-08-23 11:10:55)  

## 2019-05-18 ENCOUNTER — Inpatient Hospital Stay: Payer: Medicare Other

## 2019-05-18 ENCOUNTER — Other Ambulatory Visit: Payer: Self-pay

## 2019-05-18 VITALS — BP 149/53 | HR 73 | Temp 97.5°F | Resp 19

## 2019-05-18 DIAGNOSIS — N184 Chronic kidney disease, stage 4 (severe): Secondary | ICD-10-CM | POA: Diagnosis not present

## 2019-05-18 DIAGNOSIS — D693 Immune thrombocytopenic purpura: Secondary | ICD-10-CM | POA: Diagnosis not present

## 2019-05-18 DIAGNOSIS — D696 Thrombocytopenia, unspecified: Secondary | ICD-10-CM

## 2019-05-18 LAB — CBC WITH DIFFERENTIAL (CANCER CENTER ONLY)
Abs Immature Granulocytes: 0.06 10*3/uL (ref 0.00–0.07)
Basophils Absolute: 0 10*3/uL (ref 0.0–0.1)
Basophils Relative: 0 %
Eosinophils Absolute: 0.1 10*3/uL (ref 0.0–0.5)
Eosinophils Relative: 2 %
HCT: 31.6 % — ABNORMAL LOW (ref 36.0–46.0)
Hemoglobin: 10 g/dL — ABNORMAL LOW (ref 12.0–15.0)
Immature Granulocytes: 1 %
Lymphocytes Relative: 16 %
Lymphs Abs: 1.1 10*3/uL (ref 0.7–4.0)
MCH: 29.8 pg (ref 26.0–34.0)
MCHC: 31.6 g/dL (ref 30.0–36.0)
MCV: 94 fL (ref 80.0–100.0)
Monocytes Absolute: 0.5 10*3/uL (ref 0.1–1.0)
Monocytes Relative: 7 %
Neutro Abs: 5 10*3/uL (ref 1.7–7.7)
Neutrophils Relative %: 74 %
Platelet Count: 138 10*3/uL — ABNORMAL LOW (ref 150–400)
RBC: 3.36 MIL/uL — ABNORMAL LOW (ref 3.87–5.11)
RDW: 19.2 % — ABNORMAL HIGH (ref 11.5–15.5)
WBC Count: 6.7 10*3/uL (ref 4.0–10.5)
nRBC: 0 % (ref 0.0–0.2)

## 2019-05-18 MED ORDER — ROMIPLOSTIM INJECTION 500 MCG
400.0000 ug | Freq: Once | SUBCUTANEOUS | Status: AC
  Administered 2019-05-18: 400 ug via SUBCUTANEOUS
  Filled 2019-05-18: qty 0.8

## 2019-05-25 ENCOUNTER — Inpatient Hospital Stay: Payer: Medicare Other

## 2019-05-25 ENCOUNTER — Other Ambulatory Visit: Payer: Self-pay

## 2019-05-25 DIAGNOSIS — N184 Chronic kidney disease, stage 4 (severe): Secondary | ICD-10-CM | POA: Diagnosis not present

## 2019-05-25 DIAGNOSIS — D693 Immune thrombocytopenic purpura: Secondary | ICD-10-CM | POA: Diagnosis not present

## 2019-05-25 LAB — CBC WITH DIFFERENTIAL (CANCER CENTER ONLY)
Abs Immature Granulocytes: 0.08 10*3/uL — ABNORMAL HIGH (ref 0.00–0.07)
Basophils Absolute: 0 10*3/uL (ref 0.0–0.1)
Basophils Relative: 0 %
Eosinophils Absolute: 0.1 10*3/uL (ref 0.0–0.5)
Eosinophils Relative: 2 %
HCT: 31.1 % — ABNORMAL LOW (ref 36.0–46.0)
Hemoglobin: 9.9 g/dL — ABNORMAL LOW (ref 12.0–15.0)
Immature Granulocytes: 1 %
Lymphocytes Relative: 18 %
Lymphs Abs: 1.1 10*3/uL (ref 0.7–4.0)
MCH: 30.1 pg (ref 26.0–34.0)
MCHC: 31.8 g/dL (ref 30.0–36.0)
MCV: 94.5 fL (ref 80.0–100.0)
Monocytes Absolute: 0.4 10*3/uL (ref 0.1–1.0)
Monocytes Relative: 7 %
Neutro Abs: 4.4 10*3/uL (ref 1.7–7.7)
Neutrophils Relative %: 72 %
Platelet Count: 168 10*3/uL (ref 150–400)
RBC: 3.29 MIL/uL — ABNORMAL LOW (ref 3.87–5.11)
RDW: 19.5 % — ABNORMAL HIGH (ref 11.5–15.5)
WBC Count: 6 10*3/uL (ref 4.0–10.5)
nRBC: 0.3 % — ABNORMAL HIGH (ref 0.0–0.2)

## 2019-05-25 LAB — CMP (CANCER CENTER ONLY)
ALT: 12 U/L (ref 0–44)
AST: 18 U/L (ref 15–41)
Albumin: 4.2 g/dL (ref 3.5–5.0)
Alkaline Phosphatase: 38 U/L (ref 38–126)
Anion gap: 11 (ref 5–15)
BUN: 34 mg/dL — ABNORMAL HIGH (ref 8–23)
CO2: 31 mmol/L (ref 22–32)
Calcium: 9.7 mg/dL (ref 8.9–10.3)
Chloride: 99 mmol/L (ref 98–111)
Creatinine: 2.56 mg/dL — ABNORMAL HIGH (ref 0.44–1.00)
GFR, Est AFR Am: 20 mL/min — ABNORMAL LOW (ref >60)
GFR, Estimated: 17 mL/min — ABNORMAL LOW (ref >60)
Glucose, Bld: 206 mg/dL — ABNORMAL HIGH (ref 70–99)
Potassium: 4.4 mmol/L (ref 3.5–5.1)
Sodium: 141 mmol/L (ref 135–145)
Total Bilirubin: 0.7 mg/dL (ref 0.3–1.2)
Total Protein: 7.2 g/dL (ref 6.5–8.1)

## 2019-05-25 LAB — RETICULOCYTES
Immature Retic Fract: 22.7 % — ABNORMAL HIGH (ref 2.3–15.9)
RBC.: 3.33 MIL/uL — ABNORMAL LOW (ref 3.87–5.11)
Retic Count, Absolute: 115.9 10*3/uL (ref 19.0–186.0)
Retic Ct Pct: 3.5 % — ABNORMAL HIGH (ref 0.4–3.1)

## 2019-05-25 MED ORDER — ROMIPLOSTIM INJECTION 500 MCG
400.0000 ug | Freq: Once | SUBCUTANEOUS | Status: AC
  Administered 2019-05-25: 400 ug via SUBCUTANEOUS
  Filled 2019-05-25: qty 0.8

## 2019-05-25 NOTE — Patient Instructions (Signed)
Romiplostim injection What is this medicine? ROMIPLOSTIM (roe mi PLOE stim) helps your body make more platelets. This medicine is used to treat low platelets caused by chronic idiopathic thrombocytopenic purpura (ITP). This medicine may be used for other purposes; ask your health care provider or pharmacist if you have questions. COMMON BRAND NAME(S): Nplate What should I tell my health care provider before I take this medicine? They need to know if you have any of these conditions:  bleeding disorders  bone marrow problem, like blood cancer or myelodysplastic syndrome  history of blood clots  liver disease  surgery to remove your spleen  an unusual or allergic reaction to romiplostim, mannitol, other medicines, foods, dyes, or preservatives  pregnant or trying to get pregnant  breast-feeding How should I use this medicine? This medicine is for injection under the skin. It is given by a health care professional in a hospital or clinic setting. A special MedGuide will be given to you before your injection. Read this information carefully each time. Talk to your pediatrician regarding the use of this medicine in children. While this drug may be prescribed for children as young as 1 year for selected conditions, precautions do apply. Overdosage: If you think you have taken too much of this medicine contact a poison control center or emergency room at once. NOTE: This medicine is only for you. Do not share this medicine with others. What if I miss a dose? It is important not to miss your dose. Call your doctor or health care professional if you are unable to keep an appointment. What may interact with this medicine? Interactions are not expected. This list may not describe all possible interactions. Give your health care provider a list of all the medicines, herbs, non-prescription drugs, or dietary supplements you use. Also tell them if you smoke, drink alcohol, or use illegal drugs.  Some items may interact with your medicine. What should I watch for while using this medicine? Your condition will be monitored carefully while you are receiving this medicine. Visit your prescriber or health care professional for regular checks on your progress and for the needed blood tests. It is important to keep all appointments. What side effects may I notice from receiving this medicine? Side effects that you should report to your doctor or health care professional as soon as possible:  allergic reactions like skin rash, itching or hives, swelling of the face, lips, or tongue  signs and symptoms of bleeding such as bloody or black, tarry stools; red or dark brown urine; spitting up blood or brown material that looks like coffee grounds; red spots on the skin; unusual bruising or bleeding from the eyes, gums, or nose  signs and symptoms of a blood clot such as chest pain; shortness of breath; pain, swelling, or warmth in the leg  signs and symptoms of a stroke like changes in vision; confusion; trouble speaking or understanding; severe headaches; sudden numbness or weakness of the face, arm or leg; trouble walking; dizziness; loss of balance or coordination Side effects that usually do not require medical attention (report to your doctor or health care professional if they continue or are bothersome):  headache  pain in arms and legs  pain in mouth  stomach pain This list may not describe all possible side effects. Call your doctor for medical advice about side effects. You may report side effects to FDA at 1-800-FDA-1088. Where should I keep my medicine? This drug is given in a hospital or clinic   and will not be stored at home. NOTE: This sheet is a summary. It may not cover all possible information. If you have questions about this medicine, talk to your doctor, pharmacist, or health care provider.  2020 [employer]/Gold Standard (2017-08-23 11:10:55)  

## 2019-05-26 LAB — IRON AND TIBC
Iron: 67 ug/dL (ref 41–142)
Saturation Ratios: 17 % — ABNORMAL LOW (ref 21–57)
TIBC: 394 ug/dL (ref 236–444)
UIBC: 326 ug/dL (ref 120–384)

## 2019-05-26 LAB — FERRITIN: Ferritin: 91 ng/mL (ref 11–307)

## 2019-05-26 LAB — ERYTHROPOIETIN: Erythropoietin: 97.7 m[IU]/mL — ABNORMAL HIGH (ref 2.6–18.5)

## 2019-05-29 DIAGNOSIS — I5033 Acute on chronic diastolic (congestive) heart failure: Secondary | ICD-10-CM | POA: Diagnosis not present

## 2019-05-29 DIAGNOSIS — I130 Hypertensive heart and chronic kidney disease with heart failure and stage 1 through stage 4 chronic kidney disease, or unspecified chronic kidney disease: Secondary | ICD-10-CM | POA: Diagnosis not present

## 2019-05-29 DIAGNOSIS — R2681 Unsteadiness on feet: Secondary | ICD-10-CM | POA: Diagnosis not present

## 2019-05-31 DIAGNOSIS — R2681 Unsteadiness on feet: Secondary | ICD-10-CM | POA: Diagnosis not present

## 2019-05-31 DIAGNOSIS — I130 Hypertensive heart and chronic kidney disease with heart failure and stage 1 through stage 4 chronic kidney disease, or unspecified chronic kidney disease: Secondary | ICD-10-CM | POA: Diagnosis not present

## 2019-05-31 DIAGNOSIS — I5033 Acute on chronic diastolic (congestive) heart failure: Secondary | ICD-10-CM | POA: Diagnosis not present

## 2019-06-01 ENCOUNTER — Other Ambulatory Visit: Payer: Self-pay | Admitting: *Deleted

## 2019-06-01 ENCOUNTER — Inpatient Hospital Stay: Payer: Medicare Other

## 2019-06-01 ENCOUNTER — Other Ambulatory Visit: Payer: Self-pay

## 2019-06-01 ENCOUNTER — Encounter: Payer: Self-pay | Admitting: Family

## 2019-06-01 ENCOUNTER — Inpatient Hospital Stay (HOSPITAL_BASED_OUTPATIENT_CLINIC_OR_DEPARTMENT_OTHER): Payer: Medicare Other | Admitting: Family

## 2019-06-01 VITALS — BP 147/43 | HR 80 | Temp 97.1°F | Resp 18 | Ht 60.0 in | Wt 253.1 lb

## 2019-06-01 DIAGNOSIS — N184 Chronic kidney disease, stage 4 (severe): Secondary | ICD-10-CM | POA: Diagnosis not present

## 2019-06-01 DIAGNOSIS — D693 Immune thrombocytopenic purpura: Secondary | ICD-10-CM | POA: Diagnosis not present

## 2019-06-01 LAB — CBC WITH DIFFERENTIAL (CANCER CENTER ONLY)
Abs Immature Granulocytes: 0.04 10*3/uL (ref 0.00–0.07)
Basophils Absolute: 0 10*3/uL (ref 0.0–0.1)
Basophils Relative: 0 %
Eosinophils Absolute: 0.1 10*3/uL (ref 0.0–0.5)
Eosinophils Relative: 2 %
HCT: 32.3 % — ABNORMAL LOW (ref 36.0–46.0)
Hemoglobin: 10.4 g/dL — ABNORMAL LOW (ref 12.0–15.0)
Immature Granulocytes: 1 %
Lymphocytes Relative: 17 %
Lymphs Abs: 1.1 10*3/uL (ref 0.7–4.0)
MCH: 30.1 pg (ref 26.0–34.0)
MCHC: 32.2 g/dL (ref 30.0–36.0)
MCV: 93.4 fL (ref 80.0–100.0)
Monocytes Absolute: 0.5 10*3/uL (ref 0.1–1.0)
Monocytes Relative: 8 %
Neutro Abs: 4.5 10*3/uL (ref 1.7–7.7)
Neutrophils Relative %: 72 %
Platelet Count: 169 10*3/uL (ref 150–400)
RBC: 3.46 MIL/uL — ABNORMAL LOW (ref 3.87–5.11)
RDW: 19.4 % — ABNORMAL HIGH (ref 11.5–15.5)
WBC Count: 6.3 10*3/uL (ref 4.0–10.5)
nRBC: 0 % (ref 0.0–0.2)

## 2019-06-01 LAB — COMPREHENSIVE METABOLIC PANEL
ALT: 14 U/L (ref 0–44)
AST: 19 U/L (ref 15–41)
Albumin: 4.4 g/dL (ref 3.5–5.0)
Alkaline Phosphatase: 44 U/L (ref 38–126)
Anion gap: 9 (ref 5–15)
BUN: 30 mg/dL — ABNORMAL HIGH (ref 8–23)
CO2: 32 mmol/L (ref 22–32)
Calcium: 9.8 mg/dL (ref 8.9–10.3)
Chloride: 100 mmol/L (ref 98–111)
Creatinine, Ser: 2.47 mg/dL — ABNORMAL HIGH (ref 0.44–1.00)
GFR calc Af Amer: 21 mL/min — ABNORMAL LOW (ref >60)
GFR calc non Af Amer: 18 mL/min — ABNORMAL LOW (ref >60)
Glucose, Bld: 163 mg/dL — ABNORMAL HIGH (ref 70–99)
Potassium: 4.4 mmol/L (ref 3.5–5.1)
Sodium: 141 mmol/L (ref 135–145)
Total Bilirubin: 0.6 mg/dL (ref 0.3–1.2)
Total Protein: 7.8 g/dL (ref 6.5–8.1)

## 2019-06-01 MED ORDER — ROMIPLOSTIM INJECTION 500 MCG
400.0000 ug | Freq: Once | SUBCUTANEOUS | Status: AC
  Administered 2019-06-01: 15:00:00 400 ug via SUBCUTANEOUS
  Filled 2019-06-01: qty 0.8

## 2019-06-01 NOTE — Patient Instructions (Signed)
Romiplostim injection What is this medicine? ROMIPLOSTIM (roe mi PLOE stim) helps your body make more platelets. This medicine is used to treat low platelets caused by chronic idiopathic thrombocytopenic purpura (ITP). This medicine may be used for other purposes; ask your health care provider or pharmacist if you have questions. COMMON BRAND NAME(S): Nplate What should I tell my health care provider before I take this medicine? They need to know if you have any of these conditions:  bleeding disorders  bone marrow problem, like blood cancer or myelodysplastic syndrome  history of blood clots  liver disease  surgery to remove your spleen  an unusual or allergic reaction to romiplostim, mannitol, other medicines, foods, dyes, or preservatives  pregnant or trying to get pregnant  breast-feeding How should I use this medicine? This medicine is for injection under the skin. It is given by a health care professional in a hospital or clinic setting. A special MedGuide will be given to you before your injection. Read this information carefully each time. Talk to your pediatrician regarding the use of this medicine in children. While this drug may be prescribed for children as young as 1 year for selected conditions, precautions do apply. Overdosage: If you think you have taken too much of this medicine contact a poison control center or emergency room at once. NOTE: This medicine is only for you. Do not share this medicine with others. What if I miss a dose? It is important not to miss your dose. Call your doctor or health care professional if you are unable to keep an appointment. What may interact with this medicine? Interactions are not expected. This list may not describe all possible interactions. Give your health care provider a list of all the medicines, herbs, non-prescription drugs, or dietary supplements you use. Also tell them if you smoke, drink alcohol, or use illegal drugs.  Some items may interact with your medicine. What should I watch for while using this medicine? Your condition will be monitored carefully while you are receiving this medicine. Visit your prescriber or health care professional for regular checks on your progress and for the needed blood tests. It is important to keep all appointments. What side effects may I notice from receiving this medicine? Side effects that you should report to your doctor or health care professional as soon as possible:  allergic reactions like skin rash, itching or hives, swelling of the face, lips, or tongue  signs and symptoms of bleeding such as bloody or black, tarry stools; red or dark brown urine; spitting up blood or brown material that looks like coffee grounds; red spots on the skin; unusual bruising or bleeding from the eyes, gums, or nose  signs and symptoms of a blood clot such as chest pain; shortness of breath; pain, swelling, or warmth in the leg  signs and symptoms of a stroke like changes in vision; confusion; trouble speaking or understanding; severe headaches; sudden numbness or weakness of the face, arm or leg; trouble walking; dizziness; loss of balance or coordination Side effects that usually do not require medical attention (report to your doctor or health care professional if they continue or are bothersome):  headache  pain in arms and legs  pain in mouth  stomach pain This list may not describe all possible side effects. Call your doctor for medical advice about side effects. You may report side effects to FDA at 1-800-FDA-1088. Where should I keep my medicine? This drug is given in a hospital or clinic   and will not be stored at home. NOTE: This sheet is a summary. It may not cover all possible information. If you have questions about this medicine, talk to your doctor, pharmacist, or health care provider.  2020 [employer]/Gold Standard (2017-08-23 11:10:55)  

## 2019-06-01 NOTE — Progress Notes (Signed)
Hematology and Oncology Follow Up Visit  [name] [id number] 11-19-1934 84 y.o. 06/01/2019   Principle Diagnosis:  Immune based thrombocytopenia History of PE Iron deficiency anemia Atrial fib  Current Therapy:   Nplate q week for platelet count < 100K IV Iron as indicated  Eliquis 2.5 mg po BID   Interim History:  [name] is here today for follow-up and Nplate. She is doing well and has no complaints at this time.  Platelet count is stable at 169.  She bruises easily but has not noted any episodes of bleeding. No petechiae.  She is waiting to hear from Dr. Marlou Porch office regarding having a cardioversion for atrial fib. She has been on Eliquis for about a month now in preparation.  No fever, chills n/v, cough, rash, dizziness, SOB, abdominal pain or changes in bowel or bladder habits.  She has requested PT with her PCP for balance and conditioning. She states that she thinks it will be 2 days a week to start.  She has chronic swelling in her lower extremities secondary to venous insufficiency. She will sometimes wrap them to help reduce the fluid retention. She is taking Lasix daily as well.  The neuropathy in her feet is stable and unchanged.  No falls or syncopal episodes. She uses her rolling walker with seat when ambulating for added support.  She states that her blood sugars have been much better on Trulicity and Insulin and her last Hgb A1c was 6.2. She has a follow-up appointment with Endo on 06/27/19.  She is eating well and staying hydrated. Her weight is stable.   ECOG Performance Status: 1 - Symptomatic but completely ambulatory  Medications:  Allergies as of 06/01/2019      Reactions   Apixaban Other (See Comments)   Internal Bleeding   Aspirin Itching, Rash, Hives, Swelling   Swelling of her tongue   Mirabegron Other (See Comments)   Patient experienced A-Fib   Metformin Diarrhea, Nausea Only   Pineapple Swelling   Throat swells and blisters on tongue  and roof of mouth per patient   Tetracycline Hives   Fluticasone-salmeterol Itching, Rash   Iodinated Diagnostic Agents Itching, Rash      Lactose Intolerance (gi) Other (See Comments)   gas   Latex Itching, Rash, Other (See Comments)   Pt. States latex pulls her skin off.    Lisinopril Cough   Metronidazole Other (See Comments)   Unknown   Sulfa Antibiotics Rash   Sulfonamide Derivatives Itching, Rash      Medication List       Accurate as of June 01, 2019  3:16 PM. If you have any questions, ask your nurse or doctor.        STOP taking these medications   diltiazem 180 MG 24 hr capsule Commonly known as: CARDIZEM CD Stopped by: Laverna Peace, NP     TAKE these medications   acetaminophen 325 MG tablet Commonly known as: TYLENOL Take 650 mg by mouth every 6 (six) hours as needed.   apixaban 2.5 MG Tabs tablet Commonly known as: ELIQUIS Take 1 tablet (2.5 mg total) by mouth 2 (two) times daily.   atorvastatin 40 MG tablet Commonly known as: LIPITOR Take 40 mg by mouth daily.   Banophen 25 MG tablet Generic drug: diphenhydrAMINE Take 25 mg by mouth daily.   bisacodyl 5 MG EC tablet Commonly known as: DULCOLAX Take 5 mg by mouth daily as needed for moderate constipation. As needed every three days  CALCIUM 1200 PO Take 1 tablet by mouth daily.   diltiazem 180 MG 24 hr capsule Commonly known as: DILACOR XR Take 180 mg by mouth daily.   docusate sodium 100 MG capsule Commonly known as: COLACE Take 1 capsule by mouth daily.   Dulaglutide 1.5 MG/0.5ML Sopn Commonly known as: Trulicity Inject 1.5 mg into the skin once a week.   fenofibrate 54 MG tablet TAKE 1 TABLET(54 MG) BY MOUTH DAILY   fexofenadine 180 MG tablet Commonly known as: ALLEGRA Take 180 mg by mouth daily.   Flaxseed Oil 1000 MG Caps Take by mouth 2 (two) times daily.   furosemide 40 MG tablet Commonly known as: LASIX 40 mg daily. Takes 20 some days and others take 40    HumaLOG 100 UNIT/ML injection Generic drug: insulin lispro Inject 35 Units into the skin 3 (three) times daily with meals. 04/13/2019 On Sliding Scale.   insulin glargine 100 UNIT/ML injection Commonly known as: Lantus Inject 0.4 mLs (40 Units total) into the skin daily. What changed: when to take this   Insulin Pen Needle 31G X 5 MM Misc Commonly known as: B-D UF III MINI PEN NEEDLES Use daily for insulin injection   INSULIN SYRINGE 1CC/30GX5/16" 30G X 5/16" 1 ML Misc USE BID UTD   levothyroxine 112 MCG tablet Commonly known as: SYNTHROID TAKE 1 TABLET BY MOUTH DAILY   multivitamin tablet Take 1 tablet by mouth daily.   omeprazole 20 MG capsule Commonly known as: PRILOSEC   ondansetron 4 MG tablet Commonly known as: ZOFRAN Take 4 mg by mouth every 8 (eight) hours as needed for nausea or vomiting.   potassium citrate 10 MEQ (1080 MG) SR tablet Commonly known as: UROCIT-K TK 2 TABLETS PO BID WITH MEALS   PROBIOTIC PO Take 1 capsule by mouth daily.   ranitidine 150 MG tablet Commonly known as: ZANTAC   sertraline 100 MG tablet Commonly known as: ZOLOFT Take 1 tablet by mouth daily.   triamcinolone cream 0.1 % Commonly known as: KENALOG   vitamin C 250 MG tablet Commonly known as: ASCORBIC ACID Take 250 mg by mouth daily.   Vitamin D3 50 MCG (2000 UT) Tabs Take 2,000 Units by mouth daily.       Allergies:  Allergies  Allergen Reactions  . Apixaban Other (See Comments)    Internal Bleeding  . Aspirin Itching, Rash, Hives and Swelling    Swelling of her tongue  . Mirabegron Other (See Comments)    Patient experienced A-Fib  . Metformin Diarrhea and Nausea Only  . Pineapple Swelling    Throat swells and blisters on tongue and roof of mouth per patient  . Tetracycline Hives  . Fluticasone-Salmeterol Itching and Rash  . Iodinated Diagnostic Agents Itching and Rash       . Lactose Intolerance (Gi) Other (See Comments)    gas  . Latex Itching, Rash  and Other (See Comments)    Pt. States latex pulls her skin off.   . Lisinopril Cough  . Metronidazole Other (See Comments)    Unknown   . Sulfa Antibiotics Rash  . Sulfonamide Derivatives Itching and Rash    Past Medical History, Surgical history, Social history, and Family History were reviewed and updated.  Review of Systems: All other 10 point review of systems is negative.   Physical Exam:  height is 5' (1.524 m) and weight is 253 lb 1.9 oz (114.8 kg). Her temporal temperature is 97.1 F (36.2 C) (abnormal). Her blood  pressure is 147/43 (abnormal) and her pulse is 80. Her respiration is 18 and oxygen saturation is 93%.   Wt Readings from Last 3 Encounters:  06/01/19 253 lb 1.9 oz (114.8 kg)  05/04/19 255 lb 1.9 oz (115.7 kg)  03/16/19 254 lb (115.2 kg)    Ocular: Sclerae unicteric, pupils equal, round and reactive to light Ear-nose-throat: Oropharynx clear, dentition fair Lymphatic: No cervical or supraclavicular adenopathy Lungs no rales or rhonchi, good excursion bilaterally Heart regular rate and rhythm, no murmur appreciated Abd soft, nontender, positive bowel sounds, no liver or spleen tip palpated on exam, no fluid wave  MSK no focal spinal tenderness, no joint edema Neuro: non-focal, well-oriented, appropriate affect Breasts: Deferred   Lab Results  Component Value Date   WBC 6.3 06/01/2019   HGB 10.4 (L) 06/01/2019   HCT 32.3 (L) 06/01/2019   MCV 93.4 06/01/2019   PLT 169 06/01/2019   Lab Results  Component Value Date   FERRITIN 91 05/25/2019   IRON 67 05/25/2019   TIBC 394 05/25/2019   UIBC 326 05/25/2019   IRONPCTSAT 17 (L) 05/25/2019   Lab Results  Component Value Date   RETICCTPCT 3.5 (H) 05/25/2019   RBC 3.46 (L) 06/01/2019   RETICCTABS 73.3 01/28/2011   No results found for: KPAFRELGTCHN, LAMBDASER, KAPLAMBRATIO No results found for: IGGSERUM, IGA, IGMSERUM No results found for: Odetta Pink, SPEI   Chemistry      Component Value Date/Time   NA 141 06/01/2019 1411   NA 145 09/08/2017 1317   NA 142 02/25/2017 1013   K 4.4 06/01/2019 1411   K 4.5 09/08/2017 1317   K 3.8 02/25/2017 1013   CL 100 06/01/2019 1411   CL 98 09/08/2017 1317   CO2 32 06/01/2019 1411   CO2 32 09/08/2017 1317   CO2 26 02/25/2017 1013   BUN 30 (H) 06/01/2019 1411   BUN 34 (H) 09/08/2017 1317   BUN 20.1 02/25/2017 1013   CREATININE 2.47 (H) 06/01/2019 1411   CREATININE 2.56 (H) 05/25/2019 1401   CREATININE 1.9 (H) 09/08/2017 1317   CREATININE 1.8 (H) 02/25/2017 1013      Component Value Date/Time   CALCIUM 9.8 06/01/2019 1411   CALCIUM 9.3 09/08/2017 1317   CALCIUM 8.7 02/25/2017 1013   ALKPHOS 44 06/01/2019 1411   ALKPHOS 45 09/08/2017 1317   ALKPHOS 65 02/25/2017 1013   AST 19 06/01/2019 1411   AST 18 05/25/2019 1401   AST 16 02/25/2017 1013   ALT 14 06/01/2019 1411   ALT 12 05/25/2019 1401   ALT 26 09/08/2017 1317   ALT 13 02/25/2017 1013   BILITOT 0.6 06/01/2019 1411   BILITOT 0.7 05/25/2019 1401   BILITOT 0.76 02/25/2017 1013       Impression and Plan: Ms. Fahey is a very pleasant 84 yo caucasian female with immune based thrombocytopenia. She received Nplate today as planned.  We will continue to check labs and give injections weekly for now and see her back in another 6 weeks for follow-up.  Per Dr. Marin Olp she is ok to move forward with Cardioversion. We will let Dr. Marlou Porch know.  She will contact our office with any questions or concerns. We can certainly see her sooner if needed.   Laverna Peace, NP 9/24/20203:16 PM

## 2019-06-01 NOTE — Progress Notes (Signed)
Patient's pltc has been consistently between 50-200 at the current dose of 400 mcg.  Leave dose at 400 mcg for now per Dr. Marin Olp.

## 2019-06-02 ENCOUNTER — Telehealth: Payer: Self-pay | Admitting: Family

## 2019-06-02 DIAGNOSIS — I130 Hypertensive heart and chronic kidney disease with heart failure and stage 1 through stage 4 chronic kidney disease, or unspecified chronic kidney disease: Secondary | ICD-10-CM | POA: Diagnosis not present

## 2019-06-02 DIAGNOSIS — I5033 Acute on chronic diastolic (congestive) heart failure: Secondary | ICD-10-CM | POA: Diagnosis not present

## 2019-06-02 DIAGNOSIS — R2681 Unsteadiness on feet: Secondary | ICD-10-CM | POA: Diagnosis not present

## 2019-06-02 NOTE — Telephone Encounter (Signed)
Spoke with patient to confirm weekly appts per 9/24 LOS

## 2019-06-05 DIAGNOSIS — I5033 Acute on chronic diastolic (congestive) heart failure: Secondary | ICD-10-CM | POA: Diagnosis not present

## 2019-06-05 DIAGNOSIS — R2681 Unsteadiness on feet: Secondary | ICD-10-CM | POA: Diagnosis not present

## 2019-06-05 DIAGNOSIS — I130 Hypertensive heart and chronic kidney disease with heart failure and stage 1 through stage 4 chronic kidney disease, or unspecified chronic kidney disease: Secondary | ICD-10-CM | POA: Diagnosis not present

## 2019-06-06 ENCOUNTER — Ambulatory Visit (HOSPITAL_COMMUNITY)
Admission: RE | Admit: 2019-06-06 | Discharge: 2019-06-06 | Disposition: A | Discharge status: Home / Self Care | Payer: Medicare Other | Source: Ambulatory Visit | Attending: Nurse Practitioner | Admitting: Nurse Practitioner

## 2019-06-06 ENCOUNTER — Other Ambulatory Visit: Payer: Self-pay

## 2019-06-06 ENCOUNTER — Encounter (HOSPITAL_COMMUNITY): Payer: Self-pay | Admitting: Nurse Practitioner

## 2019-06-06 VITALS — BP 146/72 | HR 78 | Ht 60.0 in | Wt 253.2 lb

## 2019-06-06 DIAGNOSIS — E7800 Pure hypercholesterolemia, unspecified: Secondary | ICD-10-CM | POA: Diagnosis not present

## 2019-06-06 DIAGNOSIS — I110 Hypertensive heart disease with heart failure: Secondary | ICD-10-CM | POA: Insufficient documentation

## 2019-06-06 DIAGNOSIS — Z91018 Allergy to other foods: Secondary | ICD-10-CM | POA: Diagnosis not present

## 2019-06-06 DIAGNOSIS — Z794 Long term (current) use of insulin: Secondary | ICD-10-CM | POA: Diagnosis not present

## 2019-06-06 DIAGNOSIS — R9431 Abnormal electrocardiogram [ECG] [EKG]: Secondary | ICD-10-CM | POA: Insufficient documentation

## 2019-06-06 DIAGNOSIS — E119 Type 2 diabetes mellitus without complications: Secondary | ICD-10-CM | POA: Insufficient documentation

## 2019-06-06 DIAGNOSIS — Z8261 Family history of arthritis: Secondary | ICD-10-CM | POA: Insufficient documentation

## 2019-06-06 DIAGNOSIS — G4733 Obstructive sleep apnea (adult) (pediatric): Secondary | ICD-10-CM | POA: Diagnosis not present

## 2019-06-06 DIAGNOSIS — M419 Scoliosis, unspecified: Secondary | ICD-10-CM | POA: Diagnosis not present

## 2019-06-06 DIAGNOSIS — Z881 Allergy status to other antibiotic agents status: Secondary | ICD-10-CM | POA: Insufficient documentation

## 2019-06-06 DIAGNOSIS — Z6841 Body Mass Index (BMI) 40.0 and over, adult: Secondary | ICD-10-CM | POA: Diagnosis not present

## 2019-06-06 DIAGNOSIS — Z888 Allergy status to other drugs, medicaments and biological substances status: Secondary | ICD-10-CM | POA: Diagnosis not present

## 2019-06-06 DIAGNOSIS — Z8349 Family history of other endocrine, nutritional and metabolic diseases: Secondary | ICD-10-CM | POA: Insufficient documentation

## 2019-06-06 DIAGNOSIS — Z880 Allergy status to penicillin: Secondary | ICD-10-CM | POA: Insufficient documentation

## 2019-06-06 DIAGNOSIS — I445 Left posterior fascicular block: Secondary | ICD-10-CM | POA: Insufficient documentation

## 2019-06-06 DIAGNOSIS — I480 Paroxysmal atrial fibrillation: Secondary | ICD-10-CM | POA: Insufficient documentation

## 2019-06-06 DIAGNOSIS — Z882 Allergy status to sulfonamides status: Secondary | ICD-10-CM | POA: Insufficient documentation

## 2019-06-06 DIAGNOSIS — Z8249 Family history of ischemic heart disease and other diseases of the circulatory system: Secondary | ICD-10-CM | POA: Insufficient documentation

## 2019-06-06 DIAGNOSIS — I4892 Unspecified atrial flutter: Secondary | ICD-10-CM | POA: Diagnosis not present

## 2019-06-06 DIAGNOSIS — Z79899 Other long term (current) drug therapy: Secondary | ICD-10-CM | POA: Insufficient documentation

## 2019-06-06 DIAGNOSIS — E669 Obesity, unspecified: Secondary | ICD-10-CM | POA: Diagnosis not present

## 2019-06-06 DIAGNOSIS — I4819 Other persistent atrial fibrillation: Secondary | ICD-10-CM | POA: Diagnosis not present

## 2019-06-06 DIAGNOSIS — E739 Lactose intolerance, unspecified: Secondary | ICD-10-CM | POA: Diagnosis not present

## 2019-06-06 DIAGNOSIS — F329 Major depressive disorder, single episode, unspecified: Secondary | ICD-10-CM | POA: Diagnosis not present

## 2019-06-06 DIAGNOSIS — I509 Heart failure, unspecified: Secondary | ICD-10-CM | POA: Diagnosis not present

## 2019-06-06 DIAGNOSIS — E039 Hypothyroidism, unspecified: Secondary | ICD-10-CM | POA: Diagnosis not present

## 2019-06-06 DIAGNOSIS — Z87891 Personal history of nicotine dependence: Secondary | ICD-10-CM | POA: Insufficient documentation

## 2019-06-06 DIAGNOSIS — Z91040 Latex allergy status: Secondary | ICD-10-CM | POA: Diagnosis not present

## 2019-06-06 DIAGNOSIS — Z7901 Long term (current) use of anticoagulants: Secondary | ICD-10-CM | POA: Diagnosis not present

## 2019-06-06 DIAGNOSIS — Z91041 Radiographic dye allergy status: Secondary | ICD-10-CM | POA: Insufficient documentation

## 2019-06-06 DIAGNOSIS — Z79890 Hormone replacement therapy (postmenopausal): Secondary | ICD-10-CM | POA: Diagnosis not present

## 2019-06-06 DIAGNOSIS — J45909 Unspecified asthma, uncomplicated: Secondary | ICD-10-CM | POA: Insufficient documentation

## 2019-06-06 DIAGNOSIS — Z833 Family history of diabetes mellitus: Secondary | ICD-10-CM | POA: Insufficient documentation

## 2019-06-06 DIAGNOSIS — F410 Panic disorder [episodic paroxysmal anxiety] without agoraphobia: Secondary | ICD-10-CM | POA: Diagnosis not present

## 2019-06-06 DIAGNOSIS — Z823 Family history of stroke: Secondary | ICD-10-CM | POA: Insufficient documentation

## 2019-06-06 NOTE — Progress Notes (Signed)
Primary Care Physician: Javier Glazier, MD Referring Physician: Dr. Rayann Heman Cardiologist: Dr. Marlou Porch Hematology: Dr. Cyndi Lennert [name] is a 84 y.o. female with a h/o prior paroxysmal afib as well as prior PTE. She was in [facility] PTE study with Dr Joya Gaskins years ago. She reports having palpitations (likely afib) for "years". She was placed on eliquis. She had  issues with bleeding and thrombocytopenia and has been followed by Dr Marin Olp. She has not taken anticoagulation in several years because of this..  She presented to Dr Marlou Porch 05/24/18 and was noted to be in SVT (either Aflutter vs atach). She was mostly unaware. She has noticed slight worsening of fatigue, but is not very active at baseline.  She has complicating issues of CRF (baseline creatinine 2-2.3), thrombocytopenia (platelet range 28-121k in past), anemia (baseline hb 10 ), and obesity (250 lbs).   Dr. Marlou Porch felt that rate control was her goal and increased her diltiazem to 180 mg qd. She was seen f/u in the afib clinic and was  nicely rate controlled today in the 80's. She felt improved.  Pt is now being seen in the afib clinic 06/06/19. Dr. Jonette Eva has been able to get pt 's platelets at a good stable  level and she is now back on eliquis 2.5 mg bid x 6 weeks after being off for over a year.Marland Kitchen He discussed with her and Dr. Marlou Porch  trying a cardioversion.  Dr Marlou Porch asked me to arrange and she is now here to discuss pursing this.   She states no missed doses of eliquis since starting drug. No bleeding issues. Plt's stable, last lab 169 last week. She is here with her daughter today. She lives in a retirement community.   Today, she denies symptoms of palpitations, chest pain, shortness of breath, orthopnea, PND, lower extremity edema, dizziness, presyncope, syncope, or neurologic sequela. The patient is tolerating medications without difficulties and is otherwise without complaint today.   Past Medical History:  Diagnosis Date   . Allergic rhinitis   . Anxiety state, unspecified    panic attacks  . CHF (congestive heart failure) ([place])   . Depressive disorder, not elsewhere classified   . Extrinsic asthma, unspecified    no problem since adulthood  . Obesity   . OSA on CPAP    severe  . Pure hypercholesterolemia   . Respiratory failure with hypoxia ([place]) 09/2008   acute, secondary to multiple bilateral pulmonary embolism , negative hypercoagulable workup 09/2008 hospital stay  . Scoliosis   . Syncope and collapse   . Type II or unspecified type diabetes mellitus without mention of complication, not stated as uncontrolled   . Unspecified essential hypertension   . Unspecified hypothyroidism    hypo   Past Surgical History:  Procedure Laterality Date  . EYE SURGERY    . JOINT REPLACEMENT    . knee replaced    . THYROID SURGERY      Current Outpatient Medications  Medication Sig Dispense Refill  . acetaminophen (TYLENOL) 325 MG tablet Take 650 mg by mouth every 6 (six) hours as needed.    Marland Kitchen apixaban (ELIQUIS) 2.5 MG TABS tablet Take 1 tablet (2.5 mg total) by mouth 2 (two) times daily. 60 tablet 11  . atorvastatin (LIPITOR) 40 MG tablet Take 40 mg by mouth daily.    . bisacodyl (DULCOLAX) 5 MG EC tablet Take 5 mg by mouth daily as needed for moderate constipation. As needed every three days    .  Calcium Carbonate-Vit D-Min (CALCIUM 1200 PO) Take 1 tablet by mouth daily.    . Cholecalciferol (VITAMIN D3) 2000 units TABS Take 2,000 Units by mouth daily.    Marland Kitchen diltiazem (DILACOR XR) 180 MG 24 hr capsule Take 180 mg by mouth daily.    . diphenhydrAMINE (BANOPHEN) 25 MG tablet Take 25 mg by mouth every 8 (eight) hours as needed for allergies.     Marland Kitchen docusate sodium (COLACE) 100 MG capsule Take 1 capsule by mouth daily.    . Dulaglutide (TRULICITY) 1.5 SN/0.5LZ SOPN Inject 1.5 mg into the skin once a week. 4 pen 2  . fenofibrate 54 MG tablet TAKE 1 TABLET(54 MG) BY MOUTH DAILY 90 tablet 1  . fexofenadine  (ALLEGRA) 180 MG tablet Take 180 mg by mouth daily.      . Flaxseed, Linseed, (FLAXSEED OIL) 1000 MG CAPS Take by mouth 2 (two) times daily.    . furosemide (LASIX) 40 MG tablet 40 mg daily. Takes 20 some days and others take 40    . HUMALOG 100 UNIT/ML injection Inject 35 Units into the skin 3 (three) times daily with meals. 35am 30 NOON 12-35 PM 04/13/2019 On Sliding Scale.    . insulin glargine (LANTUS) 100 UNIT/ML injection Inject 0.4 mLs (40 Units total) into the skin daily. (Patient taking differently: Inject 40 Units into the skin 2 (two) times daily. ) 30 mL 3  . levothyroxine (SYNTHROID, LEVOTHROID) 112 MCG tablet TAKE 1 TABLET BY MOUTH DAILY 90 tablet 0  . Multiple Vitamin (MULTIVITAMIN) tablet Take 1 tablet by mouth daily.      Marland Kitchen omeprazole (PRILOSEC) 20 MG capsule     . potassium citrate (UROCIT-K) 10 MEQ (1080 MG) SR tablet TK 2 TABLETS PO BID WITH MEALS  4  . Probiotic Product (PROBIOTIC PO) Take 1 capsule by mouth daily.    . sertraline (ZOLOFT) 100 MG tablet Take 1 tablet by mouth daily.  4  . vitamin C (ASCORBIC ACID) 250 MG tablet Take 250 mg by mouth daily.    . Insulin Pen Needle (B-D UF III MINI PEN NEEDLES) 31G X 5 MM MISC Use daily for insulin injection 100 each 2  . Insulin Syringe-Needle U-100 (INSULIN SYRINGE 1CC/30GX5/16") 30G X 5/16" 1 ML MISC USE BID UTD  11  . ondansetron (ZOFRAN) 4 MG tablet Take 4 mg by mouth every 8 (eight) hours as needed for nausea or vomiting.    . triamcinolone cream (KENALOG) 0.1 %      No current facility-administered medications for this encounter.     Allergies  Allergen Reactions  . Apixaban Other (See Comments)    Internal Bleeding  . Aspirin Itching, Rash, Hives and Swelling    Swelling of her tongue  . Mirabegron Other (See Comments)    Patient experienced A-Fib  . Metformin Diarrhea and Nausea Only  . Pineapple Swelling    Throat swells and blisters on tongue and roof of mouth per patient  . Tetracycline Hives  .  Fluticasone-Salmeterol Itching and Rash  . Iodinated Diagnostic Agents Itching and Rash       . Lactose Intolerance (Gi) Other (See Comments)    gas  . Latex Itching, Rash and Other (See Comments)    Pt. States latex pulls her skin off.   . Lisinopril Cough  . Metronidazole Other (See Comments)    Unknown   . Sulfa Antibiotics Rash  . Sulfonamide Derivatives Itching and Rash    Social History   Socioeconomic  History  . Marital status: Widowed    Spouse name: Not on file  . Number of children: 2  . Years of education: Not on file  . Highest education level: Not on file  Occupational History  . Occupation: OWNER    Employer: [employer]: Self employed- runs Advertising copywriter  Social Needs  . Financial resource strain: Not on file  . Food insecurity    Worry: Not on file    Inability: Not on file  . Transportation needs    Medical: Not on file    Non-medical: Not on file  Tobacco Use  . Smoking status: Former Smoker    Packs/day: 0.25    Years: 20.00    Pack years: 5.00    Types: Cigarettes    Quit date: 09/07/1989    Years since quitting: 29.7  . Smokeless tobacco: Never Used  Substance and Sexual Activity  . Alcohol use: No  . Drug use: No  . Sexual activity: Not Currently  Lifestyle  . Physical activity    Days per week: Not on file    Minutes per session: Not on file  . Stress: Not on file  Relationships  . Social Herbalist on phone: Not on file    Gets together: Not on file    Attends religious service: Not on file    Active member of club or organization: Not on file    Attends meetings of clubs or organizations: Not on file    Relationship status: Not on file  . Intimate partner violence    Fear of current or ex partner: Not on file    Emotionally abused: Not on file    Physically abused: Not on file    Forced sexual activity: Not on file  Other Topics Concern  . Not on file  Social History Narrative  . Not on file     Family History  Problem Relation Age of Onset  . Allergies Mother   . Clotting disorder Mother   . Osteoarthritis Mother   . Asthma Mother   . Arthritis Other   . Diabetes Other   . Hyperlipidemia Other   . Hypertension Other   . Coronary artery disease Other   . Stroke Other   . Osteoarthritis Daughter   . Rheum arthritis Maternal Grandmother   . Clotting disorder Maternal Grandmother   . Clotting disorder Maternal Uncle   . Clotting disorder Daughter   . Allergies Daughter   . Breast cancer Neg Hx     ROS- All systems are reviewed and negative except as per the HPI above  Physical Exam: Vitals:   06/06/19 1418  BP: (!) 146/72  Pulse: 78  Weight: 114.9 kg  Height: 5' (1.524 m)   Wt Readings from Last 3 Encounters:  06/06/19 114.9 kg  06/01/19 114.8 kg  05/04/19 115.7 kg    Labs: Lab Results  Component Value Date   NA 141 06/01/2019   K 4.4 06/01/2019   CL 100 06/01/2019   CO2 32 06/01/2019   GLUCOSE 163 (H) 06/01/2019   BUN 30 (H) 06/01/2019   CREATININE 2.47 (H) 06/01/2019   CALCIUM 9.8 06/01/2019   PHOS 4.5 08/25/2015   MG 2.2 08/25/2015   Lab Results  Component Value Date   INR 1.03 06/28/2017   Lab Results  Component Value Date   CHOL 131 05/19/2018   HDL 26.70 (L) 05/19/2018   LDLCALC 65 08/16/2009  TRIG (H) 05/19/2018    656.0 Triglyceride is over 400; calculations on Lipids are invalid.     GEN- The patient is well appearing, alert and oriented x 3 today.   Head- normocephalic, atraumatic Eyes-  Sclera clear, conjunctiva pink Ears- hearing intact Oropharynx- clear Neck- supple, no JVP Lymph- no cervical lymphadenopathy Lungs- Clear to ausculation bilaterally, normal work of breathing Heart- irregular rate and rhythm, no murmurs, rubs or gallops, PMI not laterally displaced GI- soft, NT, ND, + BS Extremities- no clubbing, cyanosis, or edema MS- no significant deformity or atrophy Skin- no rash or lesion Psych- euthymic mood, full  affect Neuro- strength and sensation are intact  EKG-afib/flutter at 78bpm with variable block    Assessment and Plan: 1.Paroxysmal afib/persisistent atrial flutter She has been in persisitent afib for over one year, rate controlled  She is not a candidate for ablation  2. Her CHA2DS2VASc score is at least  5 She is now on anticoagulation x 6 weeks, no missed does Plts staying stable No bleeding issues so far   3. Htn Stable  3.OSA Using cpap   The patient is here for cardioversion to be scheduled I have my concerns that it may not be successful to restore/maintain SR She has been in persistent afib for over one yearand that sometimes can be difficult to restore and maintain without antiarrythmic's  Discussed this thoroughly with daughter and patient [facility] feels that her stroke risk will be lower in SR The anticoagulation is mitigating the stroke risk The cardioversion to restore SR is more so to see if it improves her quality of life I feel that she has several co morbidities that restoring SR may not significantly change  how she feels overall But we will schedule and see if SR can be restored  She is unsure of the date until she checks on a few things  but she would like for Dr. Marlou Porch to perform on his next cardioversion day, 10/19 Reminded not to miss any doses of anticoagulation  She will call back and we will get on schedule  Butch Penny C. Trenton Passow, [facility] [address] [place], [place] [address] [phone]

## 2019-06-06 NOTE — H&P (View-Only) (Signed)
Primary Care Physician: Javier Glazier, MD Referring Physician: Dr. Rayann Heman Cardiologist: Dr. Marlou Porch Hematology: Dr. Cyndi Lennert [name] is a 84 y.o. female with a h/o prior paroxysmal afib as well as prior PTE. She was in [place] PTE study with Dr Joya Gaskins years ago. She reports having palpitations (likely afib) for "years". She was placed on eliquis. She had  issues with bleeding and thrombocytopenia and has been followed by Dr Marin Olp. She has not taken anticoagulation in several years because of this..  She presented to Dr Marlou Porch 05/24/18 and was noted to be in SVT (either Aflutter vs atach). She was mostly unaware. She has noticed slight worsening of fatigue, but is not very active at baseline.  She has complicating issues of CRF (baseline creatinine 2-2.3), thrombocytopenia (platelet range 28-121k in past), anemia (baseline hb 10 ), and obesity (250 lbs).   Dr. Marlou Porch felt that rate control was her goal and increased her diltiazem to 180 mg qd. She was seen f/u in the afib clinic and was  nicely rate controlled today in the 80's. She felt improved.  Pt is now being seen in the afib clinic 06/06/19. Dr. Jonette Eva has been able to get pt 's platelets at a good stable  level and she is now back on eliquis 2.5 mg bid x 6 weeks after being off for over a year.Marland Kitchen He discussed with her and Dr. Marlou Porch  trying a cardioversion.  Dr Marlou Porch asked me to arrange and she is now here to discuss pursing this.   She states no missed doses of eliquis since starting drug. No bleeding issues. Plt's stable, last lab 169 last week. She is here with her daughter today. She lives in a retirement community.   Today, she denies symptoms of palpitations, chest pain, shortness of breath, orthopnea, PND, lower extremity edema, dizziness, presyncope, syncope, or neurologic sequela. The patient is tolerating medications without difficulties and is otherwise without complaint today.   Past Medical History:  Diagnosis Date   . Allergic rhinitis   . Anxiety state, unspecified    panic attacks  . CHF (congestive heart failure) ([place])   . Depressive disorder, not elsewhere classified   . Extrinsic asthma, unspecified    no problem since adulthood  . Obesity   . OSA on CPAP    severe  . Pure hypercholesterolemia   . Respiratory failure with hypoxia ([place]) 09/2008   acute, secondary to multiple bilateral pulmonary embolism , negative hypercoagulable workup 09/2008 hospital stay  . Scoliosis   . Syncope and collapse   . Type II or unspecified type diabetes mellitus without mention of complication, not stated as uncontrolled   . Unspecified essential hypertension   . Unspecified hypothyroidism    hypo   Past Surgical History:  Procedure Laterality Date  . EYE SURGERY    . JOINT REPLACEMENT    . knee replaced    . THYROID SURGERY      Current Outpatient Medications  Medication Sig Dispense Refill  . acetaminophen (TYLENOL) 325 MG tablet Take 650 mg by mouth every 6 (six) hours as needed.    Marland Kitchen apixaban (ELIQUIS) 2.5 MG TABS tablet Take 1 tablet (2.5 mg total) by mouth 2 (two) times daily. 60 tablet 11  . atorvastatin (LIPITOR) 40 MG tablet Take 40 mg by mouth daily.    . bisacodyl (DULCOLAX) 5 MG EC tablet Take 5 mg by mouth daily as needed for moderate constipation. As needed every three days    .  Calcium Carbonate-Vit D-Min (CALCIUM 1200 PO) Take 1 tablet by mouth daily.    . Cholecalciferol (VITAMIN D3) 2000 units TABS Take 2,000 Units by mouth daily.    Marland Kitchen diltiazem (DILACOR XR) 180 MG 24 hr capsule Take 180 mg by mouth daily.    . diphenhydrAMINE (BANOPHEN) 25 MG tablet Take 25 mg by mouth every 8 (eight) hours as needed for allergies.     Marland Kitchen docusate sodium (COLACE) 100 MG capsule Take 1 capsule by mouth daily.    . Dulaglutide (TRULICITY) 1.5 EX/5.2WU SOPN Inject 1.5 mg into the skin once a week. 4 pen 2  . fenofibrate 54 MG tablet TAKE 1 TABLET(54 MG) BY MOUTH DAILY 90 tablet 1  . fexofenadine  (ALLEGRA) 180 MG tablet Take 180 mg by mouth daily.      . Flaxseed, Linseed, (FLAXSEED OIL) 1000 MG CAPS Take by mouth 2 (two) times daily.    . furosemide (LASIX) 40 MG tablet 40 mg daily. Takes 20 some days and others take 40    . HUMALOG 100 UNIT/ML injection Inject 35 Units into the skin 3 (three) times daily with meals. 35am 30 NOON 12-35 PM 04/13/2019 On Sliding Scale.    . insulin glargine (LANTUS) 100 UNIT/ML injection Inject 0.4 mLs (40 Units total) into the skin daily. (Patient taking differently: Inject 40 Units into the skin 2 (two) times daily. ) 30 mL 3  . levothyroxine (SYNTHROID, LEVOTHROID) 112 MCG tablet TAKE 1 TABLET BY MOUTH DAILY 90 tablet 0  . Multiple Vitamin (MULTIVITAMIN) tablet Take 1 tablet by mouth daily.      Marland Kitchen omeprazole (PRILOSEC) 20 MG capsule     . potassium citrate (UROCIT-K) 10 MEQ (1080 MG) SR tablet TK 2 TABLETS PO BID WITH MEALS  4  . Probiotic Product (PROBIOTIC PO) Take 1 capsule by mouth daily.    . sertraline (ZOLOFT) 100 MG tablet Take 1 tablet by mouth daily.  4  . vitamin C (ASCORBIC ACID) 250 MG tablet Take 250 mg by mouth daily.    . Insulin Pen Needle (B-D UF III MINI PEN NEEDLES) 31G X 5 MM MISC Use daily for insulin injection 100 each 2  . Insulin Syringe-Needle U-100 (INSULIN SYRINGE 1CC/30GX5/16") 30G X 5/16" 1 ML MISC USE BID UTD  11  . ondansetron (ZOFRAN) 4 MG tablet Take 4 mg by mouth every 8 (eight) hours as needed for nausea or vomiting.    . triamcinolone cream (KENALOG) 0.1 %      No current facility-administered medications for this encounter.     Allergies  Allergen Reactions  . Apixaban Other (See Comments)    Internal Bleeding  . Aspirin Itching, Rash, Hives and Swelling    Swelling of her tongue  . Mirabegron Other (See Comments)    Patient experienced A-Fib  . Metformin Diarrhea and Nausea Only  . Pineapple Swelling    Throat swells and blisters on tongue and roof of mouth per patient  . Tetracycline Hives  .  Fluticasone-Salmeterol Itching and Rash  . Iodinated Diagnostic Agents Itching and Rash       . Lactose Intolerance (Gi) Other (See Comments)    gas  . Latex Itching, Rash and Other (See Comments)    Pt. States latex pulls her skin off.   . Lisinopril Cough  . Metronidazole Other (See Comments)    Unknown   . Sulfa Antibiotics Rash  . Sulfonamide Derivatives Itching and Rash    Social History   Socioeconomic  History  . Marital status: Widowed    Spouse name: Not on file  . Number of children: 2  . Years of education: Not on file  . Highest education level: Not on file  Occupational History  . 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 Financial resource strain: Not on file  . Food insecurity    Worry: Not on file    Inability: Not on file  . Transportation needs    Medical: Not on file    Non-medical: Not on file  Tobacco Use  . Smoking status: Former Smoker    Packs/day: 0.25    Years: 20.00    Pack years: 5.00    Types: Cigarettes    Quit date: 09/07/1989    Years since quitting: 29.7  . Smokeless tobacco: Never Used  Substance and Sexual Activity  . Alcohol use: No  . Drug use: No  . Sexual activity: Not Currently  Lifestyle  . Physical activity    Days per week: Not on file    Minutes per session: Not on file  . Stress: Not on file  Relationships  . Social Herbalist on phone: Not on file    Gets together: Not on file    Attends religious service: Not on file    Active member of club or organization: Not on file    Attends meetings of clubs or organizations: Not on file    Relationship status: Not on file  . Intimate partner violence    Fear of current or ex partner: Not on file    Emotionally abused: Not on file    Physically abused: Not on file    Forced sexual activity: Not on file  Other Topics Concern  . Not on file  Social History Narrative  . Not on file     Family History  Problem Relation Age of Onset  . Allergies Mother   . Clotting disorder Mother   . Osteoarthritis Mother   . Asthma Mother   . Arthritis Other   . Diabetes Other   . Hyperlipidemia Other   . Hypertension Other   . Coronary artery disease Other   . Stroke Other   . Osteoarthritis Daughter   . Rheum arthritis Maternal Grandmother   . Clotting disorder Maternal Grandmother   . Clotting disorder Maternal Uncle   . Clotting disorder Daughter   . Allergies Daughter   . Breast cancer Neg Hx     ROS- All systems are reviewed and negative except as per the HPI above  Physical Exam: Vitals:   06/06/19 1418  BP: (!) 146/72  Pulse: 78  Weight: 114.9 kg  Height: 5' (1.524 m)   Wt Readings from Last 3 Encounters:  06/06/19 114.9 kg  06/01/19 114.8 kg  05/04/19 115.7 kg    Labs: Lab Results  Component Value Date   NA 141 06/01/2019   K 4.4 06/01/2019   CL 100 06/01/2019   CO2 32 06/01/2019   GLUCOSE 163 (H) 06/01/2019   BUN 30 (H) 06/01/2019   CREATININE 2.47 (H) 06/01/2019   CALCIUM 9.8 06/01/2019   PHOS 4.5 08/25/2015   MG 2.2 08/25/2015   Lab Results  Component Value Date   INR 1.03 06/28/2017   Lab Results  Component Value Date   CHOL 131 05/19/2018   HDL 26.70 (L) 05/19/2018   LDLCALC 65 08/16/2009  TRIG (H) 05/19/2018    656.0 Triglyceride is over 400; calculations on Lipids are invalid.     GEN- The patient is well appearing, alert and oriented x 3 today.   Head- normocephalic, atraumatic Eyes-  Sclera clear, conjunctiva pink Ears- hearing intact Oropharynx- clear Neck- supple, no JVP Lymph- no cervical lymphadenopathy Lungs- Clear to ausculation bilaterally, normal work of breathing Heart- irregular rate and rhythm, no murmurs, rubs or gallops, PMI not laterally displaced GI- soft, NT, ND, + BS Extremities- no clubbing, cyanosis, or edema MS- no significant deformity or atrophy Skin- no rash or lesion Psych- euthymic mood, full  affect Neuro- strength and sensation are intact  EKG-afib/flutter at 78bpm with variable block    Assessment and Plan: 1.Paroxysmal afib/persisistent atrial flutter She has been in persisitent afib for over one year, rate controlled  She is not a candidate for ablation  2. Her CHA2DS2VASc score is at least  5 She is now on anticoagulation x 6 weeks, no missed does Plts staying stable No bleeding issues so far   3. Htn Stable  3.OSA Using cpap   The patient is here for cardioversion to be scheduled I have my concerns that it may not be successful to restore/maintain SR She has been in persistent afib for over one yearand that sometimes can be difficult to restore and maintain without antiarrythmic's  Discussed this thoroughly with daughter and patient [facility] feels that her stroke risk will be lower in SR The anticoagulation is mitigating the stroke risk The cardioversion to restore SR is more so to see if it improves her quality of life I feel that she has several co morbidities that restoring SR may not significantly change  how she feels overall But we will schedule and see if SR can be restored  She is unsure of the date until she checks on a few things  but she would like for Dr. Marlou Porch to perform on his next cardioversion day, 10/19 Reminded not to miss any doses of anticoagulation  She will call back and we will get on schedule  Butch Penny C. Aura Bibby, [facility] [address] [place], [place] [address] [phone]

## 2019-06-07 ENCOUNTER — Encounter (HOSPITAL_COMMUNITY): Payer: Self-pay | Admitting: Nurse Practitioner

## 2019-06-07 DIAGNOSIS — I5033 Acute on chronic diastolic (congestive) heart failure: Secondary | ICD-10-CM | POA: Diagnosis not present

## 2019-06-07 DIAGNOSIS — I130 Hypertensive heart and chronic kidney disease with heart failure and stage 1 through stage 4 chronic kidney disease, or unspecified chronic kidney disease: Secondary | ICD-10-CM | POA: Diagnosis not present

## 2019-06-07 DIAGNOSIS — R2681 Unsteadiness on feet: Secondary | ICD-10-CM | POA: Diagnosis not present

## 2019-06-08 ENCOUNTER — Inpatient Hospital Stay: Payer: Medicare Other | Attending: Hematology & Oncology

## 2019-06-08 ENCOUNTER — Inpatient Hospital Stay: Payer: Medicare Other

## 2019-06-08 ENCOUNTER — Other Ambulatory Visit: Payer: Self-pay

## 2019-06-08 VITALS — BP 151/38 | HR 88 | Temp 97.4°F | Resp 17 | Wt 253.1 lb

## 2019-06-08 DIAGNOSIS — D693 Immune thrombocytopenic purpura: Secondary | ICD-10-CM | POA: Diagnosis not present

## 2019-06-08 LAB — CBC WITH DIFFERENTIAL (CANCER CENTER ONLY)
Abs Immature Granulocytes: 0.07 10*3/uL (ref 0.00–0.07)
Basophils Absolute: 0 10*3/uL (ref 0.0–0.1)
Basophils Relative: 0 %
Eosinophils Absolute: 0.2 10*3/uL (ref 0.0–0.5)
Eosinophils Relative: 2 %
HCT: 32.4 % — ABNORMAL LOW (ref 36.0–46.0)
Hemoglobin: 10.3 g/dL — ABNORMAL LOW (ref 12.0–15.0)
Immature Granulocytes: 1 %
Lymphocytes Relative: 16 %
Lymphs Abs: 1.2 10*3/uL (ref 0.7–4.0)
MCH: 29.8 pg (ref 26.0–34.0)
MCHC: 31.8 g/dL (ref 30.0–36.0)
MCV: 93.6 fL (ref 80.0–100.0)
Monocytes Absolute: 0.6 10*3/uL (ref 0.1–1.0)
Monocytes Relative: 8 %
Neutro Abs: 5.3 10*3/uL (ref 1.7–7.7)
Neutrophils Relative %: 73 %
Platelet Count: 183 10*3/uL (ref 150–400)
RBC: 3.46 MIL/uL — ABNORMAL LOW (ref 3.87–5.11)
RDW: 19.2 % — ABNORMAL HIGH (ref 11.5–15.5)
WBC Count: 7.3 10*3/uL (ref 4.0–10.5)
nRBC: 0 % (ref 0.0–0.2)

## 2019-06-08 MED ORDER — ROMIPLOSTIM INJECTION 500 MCG
7.0000 ug/kg | Freq: Once | SUBCUTANEOUS | Status: DC
  Filled 2019-06-08: qty 1.61

## 2019-06-08 MED ORDER — ROMIPLOSTIM INJECTION 500 MCG
400.0000 ug | Freq: Once | SUBCUTANEOUS | Status: AC
  Administered 2019-06-08: 400 ug via SUBCUTANEOUS
  Filled 2019-06-08: qty 0.8

## 2019-06-08 NOTE — Patient Instructions (Signed)
Romiplostim injection What is this medicine? ROMIPLOSTIM (roe mi PLOE stim) helps your body make more platelets. This medicine is used to treat low platelets caused by chronic idiopathic thrombocytopenic purpura (ITP). This medicine may be used for other purposes; ask your health care provider or pharmacist if you have questions. COMMON BRAND NAME(S): Nplate What should I tell my health care provider before I take this medicine? They need to know if you have any of these conditions:  bleeding disorders  bone marrow problem, like blood cancer or myelodysplastic syndrome  history of blood clots  liver disease  surgery to remove your spleen  an unusual or allergic reaction to romiplostim, mannitol, other medicines, foods, dyes, or preservatives  pregnant or trying to get pregnant  breast-feeding How should I use this medicine? This medicine is for injection under the skin. It is given by a health care professional in a hospital or clinic setting. A special MedGuide will be given to you before your injection. Read this information carefully each time. Talk to your pediatrician regarding the use of this medicine in children. While this drug may be prescribed for children as young as 1 year for selected conditions, precautions do apply. Overdosage: If you think you have taken too much of this medicine contact a poison control center or emergency room at once. NOTE: This medicine is only for you. Do not share this medicine with others. What if I miss a dose? It is important not to miss your dose. Call your doctor or health care professional if you are unable to keep an appointment. What may interact with this medicine? Interactions are not expected. This list may not describe all possible interactions. Give your health care provider a list of all the medicines, herbs, non-prescription drugs, or dietary supplements you use. Also tell them if you smoke, drink alcohol, or use illegal drugs.  Some items may interact with your medicine. What should I watch for while using this medicine? Your condition will be monitored carefully while you are receiving this medicine. Visit your prescriber or health care professional for regular checks on your progress and for the needed blood tests. It is important to keep all appointments. What side effects may I notice from receiving this medicine? Side effects that you should report to your doctor or health care professional as soon as possible:  allergic reactions like skin rash, itching or hives, swelling of the face, lips, or tongue  signs and symptoms of bleeding such as bloody or black, tarry stools; red or dark brown urine; spitting up blood or brown material that looks like coffee grounds; red spots on the skin; unusual bruising or bleeding from the eyes, gums, or nose  signs and symptoms of a blood clot such as chest pain; shortness of breath; pain, swelling, or warmth in the leg  signs and symptoms of a stroke like changes in vision; confusion; trouble speaking or understanding; severe headaches; sudden numbness or weakness of the face, arm or leg; trouble walking; dizziness; loss of balance or coordination Side effects that usually do not require medical attention (report to your doctor or health care professional if they continue or are bothersome):  headache  pain in arms and legs  pain in mouth  stomach pain This list may not describe all possible side effects. Call your doctor for medical advice about side effects. You may report side effects to FDA at 1-800-FDA-1088. Where should I keep my medicine? This drug is given in a hospital or clinic   and will not be stored at home. NOTE: This sheet is a summary. It may not cover all possible information. If you have questions about this medicine, talk to your doctor, pharmacist, or health care provider.  2020 [employer]/Gold Standard (2017-08-23 11:10:55)  

## 2019-06-09 ENCOUNTER — Encounter (HOSPITAL_COMMUNITY): Payer: Self-pay

## 2019-06-09 ENCOUNTER — Other Ambulatory Visit (HOSPITAL_COMMUNITY): Payer: Self-pay | Admitting: *Deleted

## 2019-06-09 DIAGNOSIS — R2681 Unsteadiness on feet: Secondary | ICD-10-CM | POA: Diagnosis not present

## 2019-06-09 DIAGNOSIS — I130 Hypertensive heart and chronic kidney disease with heart failure and stage 1 through stage 4 chronic kidney disease, or unspecified chronic kidney disease: Secondary | ICD-10-CM | POA: Diagnosis not present

## 2019-06-09 DIAGNOSIS — I5033 Acute on chronic diastolic (congestive) heart failure: Secondary | ICD-10-CM | POA: Diagnosis not present

## 2019-06-12 DIAGNOSIS — I130 Hypertensive heart and chronic kidney disease with heart failure and stage 1 through stage 4 chronic kidney disease, or unspecified chronic kidney disease: Secondary | ICD-10-CM | POA: Diagnosis not present

## 2019-06-12 DIAGNOSIS — R2681 Unsteadiness on feet: Secondary | ICD-10-CM | POA: Diagnosis not present

## 2019-06-12 DIAGNOSIS — I5033 Acute on chronic diastolic (congestive) heart failure: Secondary | ICD-10-CM | POA: Diagnosis not present

## 2019-06-14 DIAGNOSIS — I130 Hypertensive heart and chronic kidney disease with heart failure and stage 1 through stage 4 chronic kidney disease, or unspecified chronic kidney disease: Secondary | ICD-10-CM | POA: Diagnosis not present

## 2019-06-14 DIAGNOSIS — I5033 Acute on chronic diastolic (congestive) heart failure: Secondary | ICD-10-CM | POA: Diagnosis not present

## 2019-06-14 DIAGNOSIS — R2681 Unsteadiness on feet: Secondary | ICD-10-CM | POA: Diagnosis not present

## 2019-06-15 ENCOUNTER — Inpatient Hospital Stay: Payer: Medicare Other

## 2019-06-15 ENCOUNTER — Other Ambulatory Visit: Payer: Self-pay

## 2019-06-15 VITALS — BP 144/61 | HR 88 | Temp 96.9°F | Resp 16

## 2019-06-15 DIAGNOSIS — D696 Thrombocytopenia, unspecified: Secondary | ICD-10-CM

## 2019-06-15 DIAGNOSIS — D693 Immune thrombocytopenic purpura: Secondary | ICD-10-CM

## 2019-06-15 LAB — CBC WITH DIFFERENTIAL (CANCER CENTER ONLY)
Abs Immature Granulocytes: 0.04 10*3/uL (ref 0.00–0.07)
Basophils Absolute: 0 10*3/uL (ref 0.0–0.1)
Basophils Relative: 0 %
Eosinophils Absolute: 0.2 10*3/uL (ref 0.0–0.5)
Eosinophils Relative: 2 %
HCT: 32.4 % — ABNORMAL LOW (ref 36.0–46.0)
Hemoglobin: 10.2 g/dL — ABNORMAL LOW (ref 12.0–15.0)
Immature Granulocytes: 1 %
Lymphocytes Relative: 17 %
Lymphs Abs: 1.1 10*3/uL (ref 0.7–4.0)
MCH: 29.8 pg (ref 26.0–34.0)
MCHC: 31.5 g/dL (ref 30.0–36.0)
MCV: 94.7 fL (ref 80.0–100.0)
Monocytes Absolute: 0.5 10*3/uL (ref 0.1–1.0)
Monocytes Relative: 7 %
Neutro Abs: 4.9 10*3/uL (ref 1.7–7.7)
Neutrophils Relative %: 73 %
Platelet Count: 178 10*3/uL (ref 150–400)
RBC: 3.42 MIL/uL — ABNORMAL LOW (ref 3.87–5.11)
RDW: 19 % — ABNORMAL HIGH (ref 11.5–15.5)
WBC Count: 6.7 10*3/uL (ref 4.0–10.5)
nRBC: 0 % (ref 0.0–0.2)

## 2019-06-15 MED ORDER — ROMIPLOSTIM INJECTION 500 MCG
400.0000 ug | Freq: Once | SUBCUTANEOUS | Status: AC
  Administered 2019-06-15: 400 ug via SUBCUTANEOUS
  Filled 2019-06-15: qty 0.8

## 2019-06-15 NOTE — Patient Instructions (Signed)
Romiplostim injection What is this medicine? ROMIPLOSTIM (roe mi PLOE stim) helps your body make more platelets. This medicine is used to treat low platelets caused by chronic idiopathic thrombocytopenic purpura (ITP). This medicine may be used for other purposes; ask your health care provider or pharmacist if you have questions. COMMON BRAND NAME(S): Nplate What should I tell my health care provider before I take this medicine? They need to know if you have any of these conditions:  bleeding disorders  bone marrow problem, like blood cancer or myelodysplastic syndrome  history of blood clots  liver disease  surgery to remove your spleen  an unusual or allergic reaction to romiplostim, mannitol, other medicines, foods, dyes, or preservatives  pregnant or trying to get pregnant  breast-feeding How should I use this medicine? This medicine is for injection under the skin. It is given by a health care professional in a hospital or clinic setting. A special MedGuide will be given to you before your injection. Read this information carefully each time. Talk to your pediatrician regarding the use of this medicine in children. While this drug may be prescribed for children as young as 1 year for selected conditions, precautions do apply. Overdosage: If you think you have taken too much of this medicine contact a poison control center or emergency room at once. NOTE: This medicine is only for you. Do not share this medicine with others. What if I miss a dose? It is important not to miss your dose. Call your doctor or health care professional if you are unable to keep an appointment. What may interact with this medicine? Interactions are not expected. This list may not describe all possible interactions. Give your health care provider a list of all the medicines, herbs, non-prescription drugs, or dietary supplements you use. Also tell them if you smoke, drink alcohol, or use illegal drugs.  Some items may interact with your medicine. What should I watch for while using this medicine? Your condition will be monitored carefully while you are receiving this medicine. Visit your prescriber or health care professional for regular checks on your progress and for the needed blood tests. It is important to keep all appointments. What side effects may I notice from receiving this medicine? Side effects that you should report to your doctor or health care professional as soon as possible:  allergic reactions like skin rash, itching or hives, swelling of the face, lips, or tongue  signs and symptoms of bleeding such as bloody or black, tarry stools; red or dark brown urine; spitting up blood or brown material that looks like coffee grounds; red spots on the skin; unusual bruising or bleeding from the eyes, gums, or nose  signs and symptoms of a blood clot such as chest pain; shortness of breath; pain, swelling, or warmth in the leg  signs and symptoms of a stroke like changes in vision; confusion; trouble speaking or understanding; severe headaches; sudden numbness or weakness of the face, arm or leg; trouble walking; dizziness; loss of balance or coordination Side effects that usually do not require medical attention (report to your doctor or health care professional if they continue or are bothersome):  headache  pain in arms and legs  pain in mouth  stomach pain This list may not describe all possible side effects. Call your doctor for medical advice about side effects. You may report side effects to FDA at 1-800-FDA-1088. Where should I keep my medicine? This drug is given in a hospital or clinic   and will not be stored at home. NOTE: This sheet is a summary. It may not cover all possible information. If you have questions about this medicine, talk to your doctor, pharmacist, or health care provider.  2020 [employer]/Gold Standard (2017-08-23 11:10:55)  

## 2019-06-16 DIAGNOSIS — I5033 Acute on chronic diastolic (congestive) heart failure: Secondary | ICD-10-CM | POA: Diagnosis not present

## 2019-06-16 DIAGNOSIS — I130 Hypertensive heart and chronic kidney disease with heart failure and stage 1 through stage 4 chronic kidney disease, or unspecified chronic kidney disease: Secondary | ICD-10-CM | POA: Diagnosis not present

## 2019-06-16 DIAGNOSIS — R2681 Unsteadiness on feet: Secondary | ICD-10-CM | POA: Diagnosis not present

## 2019-06-19 DIAGNOSIS — R2681 Unsteadiness on feet: Secondary | ICD-10-CM | POA: Diagnosis not present

## 2019-06-19 DIAGNOSIS — I130 Hypertensive heart and chronic kidney disease with heart failure and stage 1 through stage 4 chronic kidney disease, or unspecified chronic kidney disease: Secondary | ICD-10-CM | POA: Diagnosis not present

## 2019-06-19 DIAGNOSIS — I5033 Acute on chronic diastolic (congestive) heart failure: Secondary | ICD-10-CM | POA: Diagnosis not present

## 2019-06-21 DIAGNOSIS — I5033 Acute on chronic diastolic (congestive) heart failure: Secondary | ICD-10-CM | POA: Diagnosis not present

## 2019-06-21 DIAGNOSIS — R2681 Unsteadiness on feet: Secondary | ICD-10-CM | POA: Diagnosis not present

## 2019-06-21 DIAGNOSIS — I130 Hypertensive heart and chronic kidney disease with heart failure and stage 1 through stage 4 chronic kidney disease, or unspecified chronic kidney disease: Secondary | ICD-10-CM | POA: Diagnosis not present

## 2019-06-22 ENCOUNTER — Inpatient Hospital Stay: Payer: Medicare Other

## 2019-06-22 ENCOUNTER — Other Ambulatory Visit: Payer: Self-pay

## 2019-06-22 ENCOUNTER — Other Ambulatory Visit (HOSPITAL_COMMUNITY)
Admission: RE | Admit: 2019-06-22 | Discharge: 2019-06-22 | Disposition: A | Discharge status: Home / Self Care | Payer: Medicare Other | Source: Ambulatory Visit | Attending: Cardiology | Admitting: Cardiology

## 2019-06-22 VITALS — BP 140/64 | HR 84 | Temp 97.1°F | Resp 18

## 2019-06-22 DIAGNOSIS — D693 Immune thrombocytopenic purpura: Secondary | ICD-10-CM

## 2019-06-22 DIAGNOSIS — Z01812 Encounter for preprocedural laboratory examination: Secondary | ICD-10-CM | POA: Insufficient documentation

## 2019-06-22 DIAGNOSIS — Z20828 Contact with and (suspected) exposure to other viral communicable diseases: Secondary | ICD-10-CM | POA: Diagnosis not present

## 2019-06-22 LAB — CBC WITH DIFFERENTIAL (CANCER CENTER ONLY)
Abs Immature Granulocytes: 0.06 10*3/uL (ref 0.00–0.07)
Basophils Absolute: 0 10*3/uL (ref 0.0–0.1)
Basophils Relative: 0 %
Eosinophils Absolute: 0.1 10*3/uL (ref 0.0–0.5)
Eosinophils Relative: 2 %
HCT: 30.8 % — ABNORMAL LOW (ref 36.0–46.0)
Hemoglobin: 9.7 g/dL — ABNORMAL LOW (ref 12.0–15.0)
Immature Granulocytes: 1 %
Lymphocytes Relative: 13 %
Lymphs Abs: 0.7 10*3/uL (ref 0.7–4.0)
MCH: 29.8 pg (ref 26.0–34.0)
MCHC: 31.5 g/dL (ref 30.0–36.0)
MCV: 94.8 fL (ref 80.0–100.0)
Monocytes Absolute: 0.4 10*3/uL (ref 0.1–1.0)
Monocytes Relative: 7 %
Neutro Abs: 4 10*3/uL (ref 1.7–7.7)
Neutrophils Relative %: 77 %
Platelet Count: 184 10*3/uL (ref 150–400)
RBC: 3.25 MIL/uL — ABNORMAL LOW (ref 3.87–5.11)
RDW: 19.3 % — ABNORMAL HIGH (ref 11.5–15.5)
WBC Count: 5.3 10*3/uL (ref 4.0–10.5)
nRBC: 0 % (ref 0.0–0.2)

## 2019-06-22 MED ORDER — ROMIPLOSTIM INJECTION 500 MCG
400.0000 ug | Freq: Once | SUBCUTANEOUS | Status: AC
  Administered 2019-06-22: 400 ug via SUBCUTANEOUS
  Filled 2019-06-22: qty 0.8

## 2019-06-22 NOTE — Patient Instructions (Signed)
Romiplostim injection What is this medicine? ROMIPLOSTIM (roe mi PLOE stim) helps your body make more platelets. This medicine is used to treat low platelets caused by chronic idiopathic thrombocytopenic purpura (ITP). This medicine may be used for other purposes; ask your health care provider or pharmacist if you have questions. COMMON BRAND NAME(S): Nplate What should I tell my health care provider before I take this medicine? They need to know if you have any of these conditions:  bleeding disorders  bone marrow problem, like blood cancer or myelodysplastic syndrome  history of blood clots  liver disease  surgery to remove your spleen  an unusual or allergic reaction to romiplostim, mannitol, other medicines, foods, dyes, or preservatives  pregnant or trying to get pregnant  breast-feeding How should I use this medicine? This medicine is for injection under the skin. It is given by a health care professional in a hospital or clinic setting. A special MedGuide will be given to you before your injection. Read this information carefully each time. Talk to your pediatrician regarding the use of this medicine in children. While this drug may be prescribed for children as young as 1 year for selected conditions, precautions do apply. Overdosage: If you think you have taken too much of this medicine contact a poison control center or emergency room at once. NOTE: This medicine is only for you. Do not share this medicine with others. What if I miss a dose? It is important not to miss your dose. Call your doctor or health care professional if you are unable to keep an appointment. What may interact with this medicine? Interactions are not expected. This list may not describe all possible interactions. Give your health care provider a list of all the medicines, herbs, non-prescription drugs, or dietary supplements you use. Also tell them if you smoke, drink alcohol, or use illegal drugs.  Some items may interact with your medicine. What should I watch for while using this medicine? Your condition will be monitored carefully while you are receiving this medicine. Visit your prescriber or health care professional for regular checks on your progress and for the needed blood tests. It is important to keep all appointments. What side effects may I notice from receiving this medicine? Side effects that you should report to your doctor or health care professional as soon as possible:  allergic reactions like skin rash, itching or hives, swelling of the face, lips, or tongue  signs and symptoms of bleeding such as bloody or black, tarry stools; red or dark brown urine; spitting up blood or brown material that looks like coffee grounds; red spots on the skin; unusual bruising or bleeding from the eyes, gums, or nose  signs and symptoms of a blood clot such as chest pain; shortness of breath; pain, swelling, or warmth in the leg  signs and symptoms of a stroke like changes in vision; confusion; trouble speaking or understanding; severe headaches; sudden numbness or weakness of the face, arm or leg; trouble walking; dizziness; loss of balance or coordination Side effects that usually do not require medical attention (report to your doctor or health care professional if they continue or are bothersome):  headache  pain in arms and legs  pain in mouth  stomach pain This list may not describe all possible side effects. Call your doctor for medical advice about side effects. You may report side effects to FDA at 1-800-FDA-1088. Where should I keep my medicine? This drug is given in a hospital or clinic   and will not be stored at home. NOTE: This sheet is a summary. It may not cover all possible information. If you have questions about this medicine, talk to your doctor, pharmacist, or health care provider.  2020 [employer]/Gold Standard (2017-08-23 11:10:55)  

## 2019-06-23 DIAGNOSIS — R2681 Unsteadiness on feet: Secondary | ICD-10-CM | POA: Diagnosis not present

## 2019-06-23 DIAGNOSIS — I130 Hypertensive heart and chronic kidney disease with heart failure and stage 1 through stage 4 chronic kidney disease, or unspecified chronic kidney disease: Secondary | ICD-10-CM | POA: Diagnosis not present

## 2019-06-23 DIAGNOSIS — I5033 Acute on chronic diastolic (congestive) heart failure: Secondary | ICD-10-CM | POA: Diagnosis not present

## 2019-06-24 LAB — NOVEL CORONAVIRUS, NAA (HOSP ORDER, SEND-OUT TO REF LAB; TAT 18-24 HRS): SARS-CoV-2, NAA: NOT DETECTED

## 2019-06-26 ENCOUNTER — Encounter (HOSPITAL_COMMUNITY)
Admission: RE | Disposition: A | Discharge status: Home / Self Care | Payer: Self-pay | Source: Home / Self Care | Attending: Cardiology

## 2019-06-26 ENCOUNTER — Other Ambulatory Visit: Payer: Self-pay

## 2019-06-26 ENCOUNTER — Ambulatory Visit (HOSPITAL_COMMUNITY): Payer: Medicare Other | Admitting: Anesthesiology

## 2019-06-26 ENCOUNTER — Encounter (HOSPITAL_COMMUNITY): Payer: Self-pay

## 2019-06-26 ENCOUNTER — Ambulatory Visit (HOSPITAL_COMMUNITY)
Admission: RE | Admit: 2019-06-26 | Discharge: 2019-06-26 | Disposition: A | Discharge status: Home / Self Care | Payer: Medicare Other | Attending: Cardiology | Admitting: Cardiology

## 2019-06-26 DIAGNOSIS — I484 Atypical atrial flutter: Secondary | ICD-10-CM

## 2019-06-26 DIAGNOSIS — Z79899 Other long term (current) drug therapy: Secondary | ICD-10-CM | POA: Insufficient documentation

## 2019-06-26 DIAGNOSIS — Z6841 Body Mass Index (BMI) 40.0 and over, adult: Secondary | ICD-10-CM | POA: Insufficient documentation

## 2019-06-26 DIAGNOSIS — Z91040 Latex allergy status: Secondary | ICD-10-CM | POA: Diagnosis not present

## 2019-06-26 DIAGNOSIS — E039 Hypothyroidism, unspecified: Secondary | ICD-10-CM | POA: Insufficient documentation

## 2019-06-26 DIAGNOSIS — Z833 Family history of diabetes mellitus: Secondary | ICD-10-CM | POA: Insufficient documentation

## 2019-06-26 DIAGNOSIS — Z96659 Presence of unspecified artificial knee joint: Secondary | ICD-10-CM | POA: Insufficient documentation

## 2019-06-26 DIAGNOSIS — G4733 Obstructive sleep apnea (adult) (pediatric): Secondary | ICD-10-CM | POA: Insufficient documentation

## 2019-06-26 DIAGNOSIS — Z794 Long term (current) use of insulin: Secondary | ICD-10-CM | POA: Insufficient documentation

## 2019-06-26 DIAGNOSIS — I509 Heart failure, unspecified: Secondary | ICD-10-CM | POA: Insufficient documentation

## 2019-06-26 DIAGNOSIS — E1122 Type 2 diabetes mellitus with diabetic chronic kidney disease: Secondary | ICD-10-CM | POA: Diagnosis not present

## 2019-06-26 DIAGNOSIS — Z87891 Personal history of nicotine dependence: Secondary | ICD-10-CM | POA: Insufficient documentation

## 2019-06-26 DIAGNOSIS — Z882 Allergy status to sulfonamides status: Secondary | ICD-10-CM | POA: Diagnosis not present

## 2019-06-26 DIAGNOSIS — J45909 Unspecified asthma, uncomplicated: Secondary | ICD-10-CM | POA: Insufficient documentation

## 2019-06-26 DIAGNOSIS — I4892 Unspecified atrial flutter: Secondary | ICD-10-CM | POA: Insufficient documentation

## 2019-06-26 DIAGNOSIS — Z7901 Long term (current) use of anticoagulants: Secondary | ICD-10-CM | POA: Diagnosis not present

## 2019-06-26 DIAGNOSIS — E119 Type 2 diabetes mellitus without complications: Secondary | ICD-10-CM | POA: Insufficient documentation

## 2019-06-26 DIAGNOSIS — E7800 Pure hypercholesterolemia, unspecified: Secondary | ICD-10-CM | POA: Insufficient documentation

## 2019-06-26 DIAGNOSIS — M419 Scoliosis, unspecified: Secondary | ICD-10-CM | POA: Diagnosis not present

## 2019-06-26 DIAGNOSIS — I110 Hypertensive heart disease with heart failure: Secondary | ICD-10-CM | POA: Diagnosis not present

## 2019-06-26 DIAGNOSIS — I480 Paroxysmal atrial fibrillation: Secondary | ICD-10-CM | POA: Diagnosis not present

## 2019-06-26 DIAGNOSIS — Z886 Allergy status to analgesic agent status: Secondary | ICD-10-CM | POA: Diagnosis not present

## 2019-06-26 DIAGNOSIS — Z888 Allergy status to other drugs, medicaments and biological substances status: Secondary | ICD-10-CM | POA: Diagnosis not present

## 2019-06-26 DIAGNOSIS — Z91041 Radiographic dye allergy status: Secondary | ICD-10-CM | POA: Insufficient documentation

## 2019-06-26 DIAGNOSIS — Z823 Family history of stroke: Secondary | ICD-10-CM | POA: Insufficient documentation

## 2019-06-26 DIAGNOSIS — I4891 Unspecified atrial fibrillation: Secondary | ICD-10-CM | POA: Diagnosis not present

## 2019-06-26 DIAGNOSIS — I471 Supraventricular tachycardia: Secondary | ICD-10-CM | POA: Diagnosis not present

## 2019-06-26 DIAGNOSIS — N184 Chronic kidney disease, stage 4 (severe): Secondary | ICD-10-CM | POA: Diagnosis not present

## 2019-06-26 DIAGNOSIS — Z8261 Family history of arthritis: Secondary | ICD-10-CM | POA: Insufficient documentation

## 2019-06-26 DIAGNOSIS — E669 Obesity, unspecified: Secondary | ICD-10-CM | POA: Diagnosis not present

## 2019-06-26 DIAGNOSIS — Z79890 Hormone replacement therapy (postmenopausal): Secondary | ICD-10-CM | POA: Diagnosis not present

## 2019-06-26 HISTORY — PX: CARDIOVERSION: SHX1299

## 2019-06-26 SURGERY — CARDIOVERSION
Anesthesia: General

## 2019-06-26 MED ORDER — PROPOFOL 10 MG/ML IV BOLUS
INTRAVENOUS | Status: DC | PRN
  Administered 2019-06-26: 50 mg via INTRAVENOUS

## 2019-06-26 MED ORDER — SODIUM CHLORIDE 0.9 % IV SOLN
INTRAVENOUS | Status: DC | PRN
  Administered 2019-06-26: 12:00:00 via INTRAVENOUS

## 2019-06-26 MED ORDER — LIDOCAINE 2% (20 MG/ML) 5 ML SYRINGE
INTRAMUSCULAR | Status: DC | PRN
  Administered 2019-06-26: 60 mg via INTRAVENOUS

## 2019-06-26 NOTE — Transfer of Care (Signed)
Immediate Anesthesia Transfer of Care Note  Patient: [name]  Procedure(s) Performed: CARDIOVERSION (N/A )  Patient Location: PACU and Endoscopy Unit  Anesthesia Type:General  Level of Consciousness: patient cooperative and responds to stimulation  Airway & Oxygen Therapy: Patient Spontanous Breathing and Patient connected to nasal cannula oxygen  Post-op Assessment: Report given to RN and Post -op Vital signs reviewed and stable  Post vital signs: Reviewed and stable  Last Vitals:  Vitals Value Taken Time  BP    Temp    Pulse    Resp    SpO2      Last Pain:  Vitals:   06/26/19 1147  TempSrc: Temporal  PainSc: 0-No pain         Complications: No apparent anesthesia complications

## 2019-06-26 NOTE — CV Procedure (Signed)
    Electrical Cardioversion Procedure Note [name] [id number] Nov 10, 1934  Procedure: Electrical Cardioversion Indications:  Atrial Tachycardia  Time Out: Verified patient identification, verified procedure,medications/allergies/relevent history reviewed, required imaging and test results available.  Performed  Procedure Details  The patient was NPO after midnight. Anesthesia was administered at the beside  by Dr. Lanetta Inch with 60mg  of propofol.  Cardioversion was performed with synchronized biphasic defibrillation via AP pads with 120 joules.  1 attempt(s) were performed.  The patient converted to normal sinus rhythm. The patient tolerated the procedure well   IMPRESSION:  Successful cardioversion of atrial fibrillation/tachycardia.  HR 60  Sara Furbish, MD     [name] 06/26/2019, 12:44 PM

## 2019-06-26 NOTE — Interval H&P Note (Signed)
History and Physical Interval Note:  06/26/2019 11:56 AM  [name]  has presented today for surgery, with the diagnosis of a-fib.  The various methods of treatment have been discussed with the patient and family. After consideration of risks, benefits and other options for treatment, the patient has consented to  Procedure(s): CARDIOVERSION (N/A) as a surgical intervention.  The patient's history has been reviewed, patient examined, no change in status, stable for surgery.  I have reviewed the patient's chart and labs.  Questions were answered to the patient's satisfaction.     [employer]

## 2019-06-26 NOTE — Anesthesia Preprocedure Evaluation (Addendum)
Anesthesia Evaluation  Patient identified by MRN, date of birth, ID band Patient awake    Reviewed: Allergy & Precautions, NPO status , Patient's Chart, lab work & pertinent test results  Airway Mallampati: II  TM Distance: >3 FB Neck ROM: Full    Dental no notable dental hx. (+) Teeth Intact, Dental Advisory Given   Pulmonary asthma , sleep apnea , former smoker, PE (2010)   Pulmonary exam normal breath sounds clear to auscultation       Cardiovascular hypertension, +CHF  Normal cardiovascular exam+ dysrhythmias Atrial Fibrillation  Rhythm:Irregular Rate:Normal  HLD  TTE 2018 EF 65-70%, G2DD, mild MR   Neuro/Psych PSYCHIATRIC DISORDERS Anxiety Depression negative neurological ROS     GI/Hepatic negative GI ROS, Neg liver ROS,   Endo/Other  diabetes, Insulin DependentHypothyroidism Morbid obesity  Renal/GU negative Renal ROS  negative genitourinary   Musculoskeletal negative musculoskeletal ROS (+)   Abdominal   Peds  Hematology  (+) Blood dyscrasia (on eliquis, Hgb 9.5), anemia ,   Anesthesia Other Findings   Reproductive/Obstetrics                            Anesthesia Physical Anesthesia Plan  ASA: III  Anesthesia Plan: General   Post-op Pain Management:    Induction: Intravenous  PONV Risk Score and Plan: 3 and Propofol infusion and Treatment may vary due to age or medical condition  Airway Management Planned: Natural Airway and Mask  Additional Equipment:   Intra-op Plan:   Post-operative Plan:   Informed Consent: I have reviewed the patients History and Physical, chart, labs and discussed the procedure including the risks, benefits and alternatives for the proposed anesthesia with the patient or authorized representative who has indicated his/her understanding and acceptance.     Dental advisory given  Plan Discussed with: CRNA  Anesthesia Plan Comments:          Anesthesia Quick Evaluation

## 2019-06-26 NOTE — Discharge Instructions (Signed)

## 2019-06-27 ENCOUNTER — Ambulatory Visit: Payer: Medicare Other | Admitting: Internal Medicine

## 2019-06-27 DIAGNOSIS — N184 Chronic kidney disease, stage 4 (severe): Secondary | ICD-10-CM | POA: Diagnosis not present

## 2019-06-27 DIAGNOSIS — R0602 Shortness of breath: Secondary | ICD-10-CM | POA: Diagnosis not present

## 2019-06-27 DIAGNOSIS — I5033 Acute on chronic diastolic (congestive) heart failure: Secondary | ICD-10-CM | POA: Diagnosis not present

## 2019-06-27 DIAGNOSIS — I480 Paroxysmal atrial fibrillation: Secondary | ICD-10-CM | POA: Diagnosis not present

## 2019-06-27 LAB — POCT I-STAT, CHEM 8
BUN: 53 mg/dL — ABNORMAL HIGH (ref 8–23)
Calcium, Ion: 0.99 mmol/L — ABNORMAL LOW (ref 1.15–1.40)
Chloride: 99 mmol/L (ref 98–111)
Creatinine, Ser: 2.6 mg/dL — ABNORMAL HIGH (ref 0.44–1.00)
Glucose, Bld: 254 mg/dL — ABNORMAL HIGH (ref 70–99)
HCT: 28 % — ABNORMAL LOW (ref 36.0–46.0)
Hemoglobin: 9.5 g/dL — ABNORMAL LOW (ref 12.0–15.0)
Potassium: 4.8 mmol/L (ref 3.5–5.1)
Sodium: 138 mmol/L (ref 135–145)
TCO2: 33 mmol/L — ABNORMAL HIGH (ref 22–32)

## 2019-06-27 NOTE — Anesthesia Postprocedure Evaluation (Signed)
Anesthesia Post Note  Patient: [name]  Procedure(s) Performed: CARDIOVERSION (N/A )     Patient location during evaluation: Endoscopy Anesthesia Type: General Level of consciousness: awake and alert Pain management: pain level controlled Vital Signs Assessment: post-procedure vital signs reviewed and stable Respiratory status: spontaneous breathing, nonlabored ventilation, respiratory function stable and patient connected to nasal cannula oxygen Cardiovascular status: blood pressure returned to baseline and stable Postop Assessment: no apparent nausea or vomiting Anesthetic complications: no    Last Vitals:  Vitals:   06/26/19 1325 06/26/19 1335  BP: (!) 112/31 (!) 115/36  Pulse: (!) 59 (!) 59  Resp: 12 16  Temp:    SpO2: 90% 93%    Last Pain:  Vitals:   06/26/19 1246  TempSrc: Temporal  PainSc: 0-No pain                 Chelsey L Woodrum

## 2019-06-28 ENCOUNTER — Encounter (HOSPITAL_COMMUNITY): Payer: Self-pay | Admitting: Cardiology

## 2019-06-28 DIAGNOSIS — R0902 Hypoxemia: Secondary | ICD-10-CM | POA: Diagnosis not present

## 2019-06-28 DIAGNOSIS — R0989 Other specified symptoms and signs involving the circulatory and respiratory systems: Secondary | ICD-10-CM | POA: Diagnosis not present

## 2019-06-28 DIAGNOSIS — R0602 Shortness of breath: Secondary | ICD-10-CM | POA: Diagnosis not present

## 2019-06-29 ENCOUNTER — Other Ambulatory Visit: Payer: Medicare Other

## 2019-06-29 ENCOUNTER — Ambulatory Visit: Payer: Medicare Other

## 2019-06-30 ENCOUNTER — Inpatient Hospital Stay: Payer: Medicare Other

## 2019-06-30 ENCOUNTER — Other Ambulatory Visit: Payer: Self-pay

## 2019-06-30 VITALS — BP 147/62 | HR 57 | Temp 96.9°F | Resp 17

## 2019-06-30 DIAGNOSIS — J40 Bronchitis, not specified as acute or chronic: Secondary | ICD-10-CM | POA: Diagnosis not present

## 2019-06-30 DIAGNOSIS — R0902 Hypoxemia: Secondary | ICD-10-CM | POA: Diagnosis not present

## 2019-06-30 DIAGNOSIS — D693 Immune thrombocytopenic purpura: Secondary | ICD-10-CM

## 2019-06-30 DIAGNOSIS — R0989 Other specified symptoms and signs involving the circulatory and respiratory systems: Secondary | ICD-10-CM | POA: Diagnosis not present

## 2019-06-30 DIAGNOSIS — D696 Thrombocytopenia, unspecified: Secondary | ICD-10-CM

## 2019-06-30 LAB — CBC WITH DIFFERENTIAL (CANCER CENTER ONLY)
Abs Immature Granulocytes: 0.07 10*3/uL (ref 0.00–0.07)
Basophils Absolute: 0 10*3/uL (ref 0.0–0.1)
Basophils Relative: 0 %
Eosinophils Absolute: 0.1 10*3/uL (ref 0.0–0.5)
Eosinophils Relative: 2 %
HCT: 29.9 % — ABNORMAL LOW (ref 36.0–46.0)
Hemoglobin: 9.3 g/dL — ABNORMAL LOW (ref 12.0–15.0)
Immature Granulocytes: 1 %
Lymphocytes Relative: 10 %
Lymphs Abs: 0.6 10*3/uL — ABNORMAL LOW (ref 0.7–4.0)
MCH: 29.3 pg (ref 26.0–34.0)
MCHC: 31.1 g/dL (ref 30.0–36.0)
MCV: 94.3 fL (ref 80.0–100.0)
Monocytes Absolute: 0.4 10*3/uL (ref 0.1–1.0)
Monocytes Relative: 6 %
Neutro Abs: 5.2 10*3/uL (ref 1.7–7.7)
Neutrophils Relative %: 81 %
Platelet Count: 155 10*3/uL (ref 150–400)
RBC: 3.17 MIL/uL — ABNORMAL LOW (ref 3.87–5.11)
RDW: 18.7 % — ABNORMAL HIGH (ref 11.5–15.5)
WBC Count: 6.4 10*3/uL (ref 4.0–10.5)
nRBC: 0 % (ref 0.0–0.2)

## 2019-06-30 MED ORDER — ROMIPLOSTIM INJECTION 500 MCG
400.0000 ug | Freq: Once | SUBCUTANEOUS | Status: AC
  Administered 2019-06-30: 400 ug via SUBCUTANEOUS
  Filled 2019-06-30: qty 0.8

## 2019-07-03 ENCOUNTER — Other Ambulatory Visit: Payer: Self-pay

## 2019-07-03 ENCOUNTER — Telehealth: Payer: Self-pay | Admitting: Cardiology

## 2019-07-03 ENCOUNTER — Inpatient Hospital Stay (HOSPITAL_COMMUNITY)
Admission: EM | Admit: 2019-07-03 | Discharge: 2019-07-18 | DRG: 189 | Disposition: A | Discharge status: Skilled Nursing Facility | Payer: Medicare Other | Source: Skilled Nursing Facility | Attending: Student in an Organized Health Care Education/Training Program | Admitting: Student in an Organized Health Care Education/Training Program

## 2019-07-03 ENCOUNTER — Telehealth: Payer: Self-pay | Admitting: Internal Medicine

## 2019-07-03 ENCOUNTER — Telehealth: Payer: Self-pay | Admitting: *Deleted

## 2019-07-03 ENCOUNTER — Emergency Department (HOSPITAL_COMMUNITY): Payer: Medicare Other

## 2019-07-03 DIAGNOSIS — J80 Acute respiratory distress syndrome: Secondary | ICD-10-CM

## 2019-07-03 DIAGNOSIS — G4733 Obstructive sleep apnea (adult) (pediatric): Secondary | ICD-10-CM | POA: Diagnosis present

## 2019-07-03 DIAGNOSIS — R5383 Other fatigue: Secondary | ICD-10-CM | POA: Diagnosis not present

## 2019-07-03 DIAGNOSIS — N390 Urinary tract infection, site not specified: Secondary | ICD-10-CM | POA: Diagnosis present

## 2019-07-03 DIAGNOSIS — E1165 Type 2 diabetes mellitus with hyperglycemia: Secondary | ICD-10-CM | POA: Diagnosis present

## 2019-07-03 DIAGNOSIS — D631 Anemia in chronic kidney disease: Secondary | ICD-10-CM | POA: Diagnosis present

## 2019-07-03 DIAGNOSIS — J810 Acute pulmonary edema: Secondary | ICD-10-CM

## 2019-07-03 DIAGNOSIS — B961 Klebsiella pneumoniae [K. pneumoniae] as the cause of diseases classified elsewhere: Secondary | ICD-10-CM | POA: Diagnosis present

## 2019-07-03 DIAGNOSIS — I959 Hypotension, unspecified: Secondary | ICD-10-CM | POA: Diagnosis not present

## 2019-07-03 DIAGNOSIS — I480 Paroxysmal atrial fibrillation: Secondary | ICD-10-CM | POA: Diagnosis not present

## 2019-07-03 DIAGNOSIS — R0602 Shortness of breath: Secondary | ICD-10-CM | POA: Diagnosis not present

## 2019-07-03 DIAGNOSIS — F411 Generalized anxiety disorder: Secondary | ICD-10-CM | POA: Diagnosis present

## 2019-07-03 DIAGNOSIS — Z91041 Radiographic dye allergy status: Secondary | ICD-10-CM

## 2019-07-03 DIAGNOSIS — E039 Hypothyroidism, unspecified: Secondary | ICD-10-CM | POA: Diagnosis present

## 2019-07-03 DIAGNOSIS — Z825 Family history of asthma and other chronic lower respiratory diseases: Secondary | ICD-10-CM

## 2019-07-03 DIAGNOSIS — Z882 Allergy status to sulfonamides status: Secondary | ICD-10-CM

## 2019-07-03 DIAGNOSIS — Z9989 Dependence on other enabling machines and devices: Secondary | ICD-10-CM | POA: Diagnosis not present

## 2019-07-03 DIAGNOSIS — Z6841 Body Mass Index (BMI) 40.0 and over, adult: Secondary | ICD-10-CM

## 2019-07-03 DIAGNOSIS — N184 Chronic kidney disease, stage 4 (severe): Secondary | ICD-10-CM | POA: Diagnosis not present

## 2019-07-03 DIAGNOSIS — F329 Major depressive disorder, single episode, unspecified: Secondary | ICD-10-CM | POA: Diagnosis present

## 2019-07-03 DIAGNOSIS — R262 Difficulty in walking, not elsewhere classified: Secondary | ICD-10-CM | POA: Diagnosis not present

## 2019-07-03 DIAGNOSIS — E875 Hyperkalemia: Secondary | ICD-10-CM | POA: Diagnosis not present

## 2019-07-03 DIAGNOSIS — I10 Essential (primary) hypertension: Secondary | ICD-10-CM | POA: Diagnosis not present

## 2019-07-03 DIAGNOSIS — Z9981 Dependence on supplemental oxygen: Secondary | ICD-10-CM

## 2019-07-03 DIAGNOSIS — E611 Iron deficiency: Secondary | ICD-10-CM | POA: Diagnosis present

## 2019-07-03 DIAGNOSIS — Z794 Long term (current) use of insulin: Secondary | ICD-10-CM

## 2019-07-03 DIAGNOSIS — R7989 Other specified abnormal findings of blood chemistry: Secondary | ICD-10-CM | POA: Diagnosis not present

## 2019-07-03 DIAGNOSIS — Z66 Do not resuscitate: Secondary | ICD-10-CM | POA: Diagnosis not present

## 2019-07-03 DIAGNOSIS — D649 Anemia, unspecified: Secondary | ICD-10-CM

## 2019-07-03 DIAGNOSIS — R942 Abnormal results of pulmonary function studies: Secondary | ICD-10-CM | POA: Diagnosis not present

## 2019-07-03 DIAGNOSIS — E785 Hyperlipidemia, unspecified: Secondary | ICD-10-CM | POA: Diagnosis present

## 2019-07-03 DIAGNOSIS — Z888 Allergy status to other drugs, medicaments and biological substances status: Secondary | ICD-10-CM

## 2019-07-03 DIAGNOSIS — I484 Atypical atrial flutter: Secondary | ICD-10-CM | POA: Diagnosis not present

## 2019-07-03 DIAGNOSIS — I252 Old myocardial infarction: Secondary | ICD-10-CM

## 2019-07-03 DIAGNOSIS — I878 Other specified disorders of veins: Secondary | ICD-10-CM | POA: Diagnosis present

## 2019-07-03 DIAGNOSIS — Z79890 Hormone replacement therapy (postmenopausal): Secondary | ICD-10-CM

## 2019-07-03 DIAGNOSIS — J9602 Acute respiratory failure with hypercapnia: Secondary | ICD-10-CM | POA: Diagnosis not present

## 2019-07-03 DIAGNOSIS — J90 Pleural effusion, not elsewhere classified: Secondary | ICD-10-CM | POA: Diagnosis not present

## 2019-07-03 DIAGNOSIS — I5032 Chronic diastolic (congestive) heart failure: Secondary | ICD-10-CM | POA: Diagnosis not present

## 2019-07-03 DIAGNOSIS — Z7901 Long term (current) use of anticoagulants: Secondary | ICD-10-CM

## 2019-07-03 DIAGNOSIS — Z832 Family history of diseases of the blood and blood-forming organs and certain disorders involving the immune mechanism: Secondary | ICD-10-CM

## 2019-07-03 DIAGNOSIS — R0902 Hypoxemia: Secondary | ICD-10-CM | POA: Diagnosis not present

## 2019-07-03 DIAGNOSIS — I161 Hypertensive emergency: Secondary | ICD-10-CM | POA: Diagnosis present

## 2019-07-03 DIAGNOSIS — R1312 Dysphagia, oropharyngeal phase: Secondary | ICD-10-CM | POA: Diagnosis not present

## 2019-07-03 DIAGNOSIS — J9611 Chronic respiratory failure with hypoxia: Secondary | ICD-10-CM

## 2019-07-03 DIAGNOSIS — D693 Immune thrombocytopenic purpura: Secondary | ICD-10-CM | POA: Diagnosis not present

## 2019-07-03 DIAGNOSIS — E872 Acidosis: Secondary | ICD-10-CM | POA: Diagnosis not present

## 2019-07-03 DIAGNOSIS — J984 Other disorders of lung: Secondary | ICD-10-CM | POA: Diagnosis present

## 2019-07-03 DIAGNOSIS — Z881 Allergy status to other antibiotic agents status: Secondary | ICD-10-CM

## 2019-07-03 DIAGNOSIS — R739 Hyperglycemia, unspecified: Secondary | ICD-10-CM | POA: Diagnosis not present

## 2019-07-03 DIAGNOSIS — E1122 Type 2 diabetes mellitus with diabetic chronic kidney disease: Secondary | ICD-10-CM | POA: Diagnosis present

## 2019-07-03 DIAGNOSIS — Z1611 Resistance to penicillins: Secondary | ICD-10-CM | POA: Diagnosis present

## 2019-07-03 DIAGNOSIS — I5033 Acute on chronic diastolic (congestive) heart failure: Secondary | ICD-10-CM | POA: Diagnosis present

## 2019-07-03 DIAGNOSIS — R9431 Abnormal electrocardiogram [ECG] [EKG]: Secondary | ICD-10-CM | POA: Diagnosis not present

## 2019-07-03 DIAGNOSIS — A419 Sepsis, unspecified organism: Secondary | ICD-10-CM | POA: Diagnosis not present

## 2019-07-03 DIAGNOSIS — Z20828 Contact with and (suspected) exposure to other viral communicable diseases: Secondary | ICD-10-CM | POA: Diagnosis present

## 2019-07-03 DIAGNOSIS — J9622 Acute and chronic respiratory failure with hypercapnia: Secondary | ICD-10-CM | POA: Diagnosis present

## 2019-07-03 DIAGNOSIS — I130 Hypertensive heart and chronic kidney disease with heart failure and stage 1 through stage 4 chronic kidney disease, or unspecified chronic kidney disease: Secondary | ICD-10-CM | POA: Diagnosis not present

## 2019-07-03 DIAGNOSIS — E1151 Type 2 diabetes mellitus with diabetic peripheral angiopathy without gangrene: Secondary | ICD-10-CM | POA: Diagnosis not present

## 2019-07-03 DIAGNOSIS — J9601 Acute respiratory failure with hypoxia: Principal | ICD-10-CM

## 2019-07-03 DIAGNOSIS — Z7401 Bed confinement status: Secondary | ICD-10-CM | POA: Diagnosis not present

## 2019-07-03 DIAGNOSIS — J9621 Acute and chronic respiratory failure with hypoxia: Principal | ICD-10-CM | POA: Diagnosis present

## 2019-07-03 DIAGNOSIS — R630 Anorexia: Secondary | ICD-10-CM | POA: Diagnosis not present

## 2019-07-03 DIAGNOSIS — E119 Type 2 diabetes mellitus without complications: Secondary | ICD-10-CM | POA: Diagnosis not present

## 2019-07-03 DIAGNOSIS — D6959 Other secondary thrombocytopenia: Secondary | ICD-10-CM | POA: Diagnosis present

## 2019-07-03 DIAGNOSIS — Z8261 Family history of arthritis: Secondary | ICD-10-CM

## 2019-07-03 DIAGNOSIS — R778 Other specified abnormalities of plasma proteins: Secondary | ICD-10-CM | POA: Diagnosis not present

## 2019-07-03 DIAGNOSIS — E870 Hyperosmolality and hypernatremia: Secondary | ICD-10-CM | POA: Diagnosis not present

## 2019-07-03 DIAGNOSIS — Z886 Allergy status to analgesic agent status: Secondary | ICD-10-CM

## 2019-07-03 DIAGNOSIS — I4891 Unspecified atrial fibrillation: Secondary | ICD-10-CM | POA: Diagnosis not present

## 2019-07-03 DIAGNOSIS — I509 Heart failure, unspecified: Secondary | ICD-10-CM | POA: Diagnosis not present

## 2019-07-03 DIAGNOSIS — Z8249 Family history of ischemic heart disease and other diseases of the circulatory system: Secondary | ICD-10-CM

## 2019-07-03 DIAGNOSIS — I361 Nonrheumatic tricuspid (valve) insufficiency: Secondary | ICD-10-CM | POA: Diagnosis not present

## 2019-07-03 DIAGNOSIS — Y95 Nosocomial condition: Secondary | ICD-10-CM | POA: Diagnosis present

## 2019-07-03 DIAGNOSIS — E669 Obesity, unspecified: Secondary | ICD-10-CM | POA: Diagnosis present

## 2019-07-03 DIAGNOSIS — D696 Thrombocytopenia, unspecified: Secondary | ICD-10-CM | POA: Diagnosis not present

## 2019-07-03 DIAGNOSIS — L8990 Pressure ulcer of unspecified site, unspecified stage: Secondary | ICD-10-CM | POA: Diagnosis present

## 2019-07-03 DIAGNOSIS — M6281 Muscle weakness (generalized): Secondary | ICD-10-CM | POA: Diagnosis not present

## 2019-07-03 DIAGNOSIS — J189 Pneumonia, unspecified organism: Secondary | ICD-10-CM | POA: Diagnosis present

## 2019-07-03 DIAGNOSIS — I2510 Atherosclerotic heart disease of native coronary artery without angina pectoris: Secondary | ICD-10-CM | POA: Diagnosis not present

## 2019-07-03 DIAGNOSIS — I5030 Unspecified diastolic (congestive) heart failure: Secondary | ICD-10-CM | POA: Diagnosis not present

## 2019-07-03 DIAGNOSIS — Z87891 Personal history of nicotine dependence: Secondary | ICD-10-CM

## 2019-07-03 DIAGNOSIS — J9600 Acute respiratory failure, unspecified whether with hypoxia or hypercapnia: Secondary | ICD-10-CM | POA: Diagnosis not present

## 2019-07-03 DIAGNOSIS — R358 Other polyuria: Secondary | ICD-10-CM | POA: Diagnosis not present

## 2019-07-03 DIAGNOSIS — D509 Iron deficiency anemia, unspecified: Secondary | ICD-10-CM | POA: Diagnosis not present

## 2019-07-03 DIAGNOSIS — M2550 Pain in unspecified joint: Secondary | ICD-10-CM | POA: Diagnosis not present

## 2019-07-03 DIAGNOSIS — Z79899 Other long term (current) drug therapy: Secondary | ICD-10-CM | POA: Diagnosis not present

## 2019-07-03 DIAGNOSIS — I482 Chronic atrial fibrillation, unspecified: Secondary | ICD-10-CM | POA: Diagnosis not present

## 2019-07-03 DIAGNOSIS — N189 Chronic kidney disease, unspecified: Secondary | ICD-10-CM | POA: Diagnosis present

## 2019-07-03 DIAGNOSIS — Z91040 Latex allergy status: Secondary | ICD-10-CM

## 2019-07-03 DIAGNOSIS — R1310 Dysphagia, unspecified: Secondary | ICD-10-CM | POA: Diagnosis not present

## 2019-07-03 HISTORY — DX: Chronic respiratory failure with hypoxia: J96.11

## 2019-07-03 LAB — POCT I-STAT 7, (LYTES, BLD GAS, ICA,H+H)
Acid-Base Excess: 2 mmol/L (ref 0.0–2.0)
Bicarbonate: 30.2 mmol/L — ABNORMAL HIGH (ref 20.0–28.0)
Calcium, Ion: 1.27 mmol/L (ref 1.15–1.40)
HCT: 33 % — ABNORMAL LOW (ref 36.0–46.0)
Hemoglobin: 11.2 g/dL — ABNORMAL LOW (ref 12.0–15.0)
O2 Saturation: 97 %
Patient temperature: 98.2
Potassium: 3.8 mmol/L (ref 3.5–5.1)
Sodium: 140 mmol/L (ref 135–145)
TCO2: 32 mmol/L (ref 22–32)
pCO2 arterial: 62.1 mmHg — ABNORMAL HIGH (ref 32.0–48.0)
pH, Arterial: 7.293 — ABNORMAL LOW (ref 7.350–7.450)
pO2, Arterial: 101 mmHg (ref 83.0–108.0)

## 2019-07-03 LAB — CBC WITH DIFFERENTIAL/PLATELET
Abs Immature Granulocytes: 0.17 10*3/uL — ABNORMAL HIGH (ref 0.00–0.07)
Basophils Absolute: 0.1 10*3/uL (ref 0.0–0.1)
Basophils Relative: 0 %
Eosinophils Absolute: 0.2 10*3/uL (ref 0.0–0.5)
Eosinophils Relative: 2 %
HCT: 32.8 % — ABNORMAL LOW (ref 36.0–46.0)
Hemoglobin: 10.1 g/dL — ABNORMAL LOW (ref 12.0–15.0)
Immature Granulocytes: 1 %
Lymphocytes Relative: 17 %
Lymphs Abs: 2.1 10*3/uL (ref 0.7–4.0)
MCH: 28.9 pg (ref 26.0–34.0)
MCHC: 30.8 g/dL (ref 30.0–36.0)
MCV: 94 fL (ref 80.0–100.0)
Monocytes Absolute: 0.8 10*3/uL (ref 0.1–1.0)
Monocytes Relative: 6 %
Neutro Abs: 9.3 10*3/uL — ABNORMAL HIGH (ref 1.7–7.7)
Neutrophils Relative %: 74 %
Platelets: 64 10*3/uL — ABNORMAL LOW (ref 150–400)
RBC: 3.49 MIL/uL — ABNORMAL LOW (ref 3.87–5.11)
RDW: 18.3 % — ABNORMAL HIGH (ref 11.5–15.5)
WBC: 12.6 10*3/uL — ABNORMAL HIGH (ref 4.0–10.5)
nRBC: 0 % (ref 0.0–0.2)

## 2019-07-03 LAB — COMPREHENSIVE METABOLIC PANEL
ALT: 30 U/L (ref 0–44)
AST: 33 U/L (ref 15–41)
Albumin: 3.8 g/dL (ref 3.5–5.0)
Alkaline Phosphatase: 67 U/L (ref 38–126)
Anion gap: 13 (ref 5–15)
BUN: 39 mg/dL — ABNORMAL HIGH (ref 8–23)
CO2: 30 mmol/L (ref 22–32)
Calcium: 9.3 mg/dL (ref 8.9–10.3)
Chloride: 99 mmol/L (ref 98–111)
Creatinine, Ser: 2.59 mg/dL — ABNORMAL HIGH (ref 0.44–1.00)
GFR calc Af Amer: 19 mL/min — ABNORMAL LOW (ref >60)
GFR calc non Af Amer: 17 mL/min — ABNORMAL LOW (ref >60)
Glucose, Bld: 207 mg/dL — ABNORMAL HIGH (ref 70–99)
Potassium: 3.8 mmol/L (ref 3.5–5.1)
Sodium: 142 mmol/L (ref 135–145)
Total Bilirubin: 0.8 mg/dL (ref 0.3–1.2)
Total Protein: 8.1 g/dL (ref 6.5–8.1)

## 2019-07-03 LAB — URINALYSIS, ROUTINE W REFLEX MICROSCOPIC
Bilirubin Urine: NEGATIVE
Glucose, UA: NEGATIVE mg/dL
Ketones, ur: NEGATIVE mg/dL
Nitrite: NEGATIVE
Protein, ur: 30 mg/dL — AB
Specific Gravity, Urine: 1.015 (ref 1.005–1.030)
pH: 7.5 (ref 5.0–8.0)

## 2019-07-03 LAB — I-STAT CHEM 8, ED
BUN: 39 mg/dL — ABNORMAL HIGH (ref 8–23)
Calcium, Ion: 1.17 mmol/L (ref 1.15–1.40)
Chloride: 98 mmol/L (ref 98–111)
Creatinine, Ser: 2.6 mg/dL — ABNORMAL HIGH (ref 0.44–1.00)
Glucose, Bld: 209 mg/dL — ABNORMAL HIGH (ref 70–99)
HCT: 32 % — ABNORMAL LOW (ref 36.0–46.0)
Hemoglobin: 10.9 g/dL — ABNORMAL LOW (ref 12.0–15.0)
Potassium: 3.8 mmol/L (ref 3.5–5.1)
Sodium: 140 mmol/L (ref 135–145)
TCO2: 30 mmol/L (ref 22–32)

## 2019-07-03 LAB — URINALYSIS, MICROSCOPIC (REFLEX): RBC / HPF: >50 RBC/hpf (ref 0–5)

## 2019-07-03 LAB — SARS CORONAVIRUS 2 BY RT PCR (HOSPITAL ORDER, PERFORMED IN ~~LOC~~ HOSPITAL LAB): SARS Coronavirus 2: NEGATIVE

## 2019-07-03 LAB — PROTIME-INR
INR: 1.4 — ABNORMAL HIGH (ref 0.8–1.2)
Prothrombin Time: 16.9 seconds — ABNORMAL HIGH (ref 11.4–15.2)

## 2019-07-03 LAB — MAGNESIUM: Magnesium: 2.1 mg/dL (ref 1.7–2.4)

## 2019-07-03 LAB — APTT: aPTT: 40 seconds — ABNORMAL HIGH (ref 24–36)

## 2019-07-03 LAB — LACTIC ACID, PLASMA
Lactic Acid, Venous: 1.9 mmol/L (ref 0.5–1.9)
Lactic Acid, Venous: 1.3 mmol/L (ref 0.5–1.9)

## 2019-07-03 LAB — CBG MONITORING, ED: Glucose-Capillary: 248 mg/dL — ABNORMAL HIGH (ref 70–99)

## 2019-07-03 LAB — BRAIN NATRIURETIC PEPTIDE: B Natriuretic Peptide: 285.2 pg/mL — ABNORMAL HIGH (ref 0.0–100.0)

## 2019-07-03 LAB — TROPONIN I (HIGH SENSITIVITY)
Troponin I (High Sensitivity): 14 ng/L (ref <18)
Troponin I (High Sensitivity): 132 ng/L (ref <18)

## 2019-07-03 MED ORDER — ADULT MULTIVITAMIN W/MINERALS CH
1.0000 | ORAL_TABLET | Freq: Every day | ORAL | Status: DC
  Administered 2019-07-06 – 2019-07-18 (×13): 1 via ORAL
  Filled 2019-07-03 (×13): qty 1

## 2019-07-03 MED ORDER — FUROSEMIDE 10 MG/ML IJ SOLN
40.0000 mg | Freq: Every day | INTRAMUSCULAR | Status: DC
  Administered 2019-07-04 – 2019-07-05 (×2): 40 mg via INTRAVENOUS
  Filled 2019-07-03 (×2): qty 4

## 2019-07-03 MED ORDER — ALBUTEROL SULFATE HFA 108 (90 BASE) MCG/ACT IN AERS
4.0000 | INHALATION_SPRAY | Freq: Once | RESPIRATORY_TRACT | Status: DC
  Filled 2019-07-03: qty 6.7

## 2019-07-03 MED ORDER — VITAMIN D 25 MCG (1000 UNIT) PO TABS
2000.0000 [IU] | ORAL_TABLET | Freq: Every day | ORAL | Status: DC
  Administered 2019-07-06 – 2019-07-18 (×13): 2000 [IU] via ORAL
  Filled 2019-07-03 (×13): qty 2

## 2019-07-03 MED ORDER — ATORVASTATIN CALCIUM 40 MG PO TABS
40.0000 mg | ORAL_TABLET | Freq: Every day | ORAL | Status: DC
  Administered 2019-07-05 – 2019-07-17 (×11): 40 mg via ORAL
  Filled 2019-07-03 (×12): qty 1

## 2019-07-03 MED ORDER — NITROGLYCERIN 0.4 MG SL SUBL
0.4000 mg | SUBLINGUAL_TABLET | SUBLINGUAL | Status: AC | PRN
  Administered 2019-07-03: 0.4 mg via SUBLINGUAL
  Filled 2019-07-03: qty 1

## 2019-07-03 MED ORDER — VITAMIN D 25 MCG (1000 UNIT) PO TABS: 2000.0000 [IU] | ORAL_TABLET | Freq: Every day | ORAL | Status: DC

## 2019-07-03 MED ORDER — VANCOMYCIN HCL IN DEXTROSE 1-5 GM/200ML-% IV SOLN: 1000.0000 mg | Freq: Once | INTRAVENOUS | Status: DC

## 2019-07-03 MED ORDER — NITROGLYCERIN IN D5W 200-5 MCG/ML-% IV SOLN
0.0000 ug/min | INTRAVENOUS | Status: DC
  Administered 2019-07-03: 5 ug/min via INTRAVENOUS
  Filled 2019-07-03: qty 250

## 2019-07-03 MED ORDER — PANTOPRAZOLE SODIUM 40 MG PO TBEC: 40.0000 mg | DELAYED_RELEASE_TABLET | Freq: Every day | ORAL | Status: DC

## 2019-07-03 MED ORDER — LORATADINE 10 MG PO TABS: 10.0000 mg | ORAL_TABLET | Freq: Every day | ORAL | Status: DC

## 2019-07-03 MED ORDER — VANCOMYCIN HCL 10 G IV SOLR
2500.0000 mg | Freq: Once | INTRAVENOUS | Status: AC
  Administered 2019-07-03: 2500 mg via INTRAVENOUS
  Filled 2019-07-03: qty 2500

## 2019-07-03 MED ORDER — ACETAMINOPHEN 650 MG RE SUPP: 650.0000 mg | Freq: Four times a day (QID) | RECTAL | Status: DC | PRN

## 2019-07-03 MED ORDER — FUROSEMIDE 10 MG/ML IJ SOLN
40.0000 mg | Freq: Once | INTRAMUSCULAR | Status: AC
  Administered 2019-07-03: 40 mg via INTRAVENOUS
  Filled 2019-07-03: qty 4

## 2019-07-03 MED ORDER — FENOFIBRATE 54 MG PO TABS
54.0000 mg | ORAL_TABLET | Freq: Every day | ORAL | Status: DC
  Administered 2019-07-06 – 2019-07-18 (×13): 54 mg via ORAL
  Filled 2019-07-03 (×15): qty 1

## 2019-07-03 MED ORDER — INSULIN ASPART 100 UNIT/ML ~~LOC~~ SOLN
0.0000 [IU] | Freq: Three times a day (TID) | SUBCUTANEOUS | Status: DC
  Administered 2019-07-05 – 2019-07-06 (×4): 7 [IU] via SUBCUTANEOUS
  Administered 2019-07-06 (×2): 11 [IU] via SUBCUTANEOUS
  Administered 2019-07-07: 13:00:00 15 [IU] via SUBCUTANEOUS
  Administered 2019-07-07: 7 [IU] via SUBCUTANEOUS
  Administered 2019-07-07: 07:00:00 11 [IU] via SUBCUTANEOUS
  Administered 2019-07-08: 15 [IU] via SUBCUTANEOUS
  Administered 2019-07-08: 20 [IU] via SUBCUTANEOUS
  Administered 2019-07-08: 11 [IU] via SUBCUTANEOUS
  Administered 2019-07-09: 7 [IU] via SUBCUTANEOUS
  Administered 2019-07-09: 11 [IU] via SUBCUTANEOUS
  Administered 2019-07-09: 4 [IU] via SUBCUTANEOUS
  Administered 2019-07-10: 08:00:00 11 [IU] via SUBCUTANEOUS
  Administered 2019-07-10 (×2): 7 [IU] via SUBCUTANEOUS
  Administered 2019-07-11: 4 [IU] via SUBCUTANEOUS
  Administered 2019-07-11 (×2): 7 [IU] via SUBCUTANEOUS
  Administered 2019-07-12: 20 [IU] via SUBCUTANEOUS
  Administered 2019-07-12 (×2): 7 [IU] via SUBCUTANEOUS
  Administered 2019-07-13: 11 [IU] via SUBCUTANEOUS
  Administered 2019-07-13: 20 [IU] via SUBCUTANEOUS

## 2019-07-03 MED ORDER — ACETAMINOPHEN 325 MG PO TABS
650.0000 mg | ORAL_TABLET | Freq: Four times a day (QID) | ORAL | Status: DC | PRN
  Administered 2019-07-12: 650 mg via ORAL
  Filled 2019-07-03 (×2): qty 2

## 2019-07-03 MED ORDER — VITAMIN C 500 MG PO TABS
250.0000 mg | ORAL_TABLET | Freq: Every day | ORAL | Status: DC
  Administered 2019-07-06 – 2019-07-18 (×13): 250 mg via ORAL
  Filled 2019-07-03 (×13): qty 1

## 2019-07-03 MED ORDER — IPRATROPIUM-ALBUTEROL 0.5-2.5 (3) MG/3ML IN SOLN
3.0000 mL | RESPIRATORY_TRACT | Status: DC
  Administered 2019-07-03 – 2019-07-06 (×15): 3 mL via RESPIRATORY_TRACT
  Filled 2019-07-03 (×15): qty 3

## 2019-07-03 MED ORDER — SODIUM CHLORIDE 0.9 % IV SOLN
2.0000 g | Freq: Once | INTRAVENOUS | Status: AC
  Administered 2019-07-03: 2 g via INTRAVENOUS
  Filled 2019-07-03: qty 2

## 2019-07-03 MED ORDER — INSULIN GLARGINE 100 UNIT/ML ~~LOC~~ SOLN
40.0000 [IU] | Freq: Every day | SUBCUTANEOUS | Status: DC
  Administered 2019-07-04 – 2019-07-06 (×2): 40 [IU] via SUBCUTANEOUS
  Filled 2019-07-03 (×4): qty 0.4

## 2019-07-03 MED ORDER — SODIUM CHLORIDE 0.9% FLUSH
3.0000 mL | Freq: Two times a day (BID) | INTRAVENOUS | Status: DC
  Administered 2019-07-03 – 2019-07-18 (×27): 3 mL via INTRAVENOUS

## 2019-07-03 MED ORDER — LEVOTHYROXINE SODIUM 112 MCG PO TABS
112.0000 ug | ORAL_TABLET | Freq: Every day | ORAL | Status: DC
  Administered 2019-07-04 – 2019-07-18 (×14): 112 ug via ORAL
  Filled 2019-07-03 (×14): qty 1

## 2019-07-03 MED ORDER — SODIUM CHLORIDE 0.9 % IV SOLN
2.0000 g | INTRAVENOUS | Status: DC
  Administered 2019-07-04 – 2019-07-09 (×6): 2 g via INTRAVENOUS
  Filled 2019-07-03 (×8): qty 2

## 2019-07-03 MED ORDER — SERTRALINE HCL 100 MG PO TABS: 100.0000 mg | ORAL_TABLET | Freq: Every evening | ORAL | Status: DC

## 2019-07-03 MED ORDER — DILTIAZEM HCL ER COATED BEADS 180 MG PO CP24
180.0000 mg | ORAL_CAPSULE | Freq: Every evening | ORAL | Status: DC
  Administered 2019-07-06 – 2019-07-18 (×13): 180 mg via ORAL
  Filled 2019-07-03 (×15): qty 1

## 2019-07-03 MED ORDER — INSULIN ASPART 100 UNIT/ML ~~LOC~~ SOLN
0.0000 [IU] | Freq: Every day | SUBCUTANEOUS | Status: DC
  Administered 2019-07-03 – 2019-07-04 (×2): 2 [IU] via SUBCUTANEOUS
  Administered 2019-07-05 – 2019-07-06 (×2): 3 [IU] via SUBCUTANEOUS
  Administered 2019-07-08: 2 [IU] via SUBCUTANEOUS
  Administered 2019-07-09: 5 [IU] via SUBCUTANEOUS
  Administered 2019-07-10 – 2019-07-11 (×2): 3 [IU] via SUBCUTANEOUS
  Administered 2019-07-12: 5 [IU] via SUBCUTANEOUS
  Administered 2019-07-13 – 2019-07-15 (×3): 3 [IU] via SUBCUTANEOUS
  Administered 2019-07-16: 5 [IU] via SUBCUTANEOUS

## 2019-07-03 MED ORDER — VANCOMYCIN HCL 10 G IV SOLR
1500.0000 mg | INTRAVENOUS | Status: DC
  Administered 2019-07-05: 1500 mg via INTRAVENOUS
  Filled 2019-07-03 (×3): qty 1500

## 2019-07-03 NOTE — ED Notes (Signed)
Placed NRB Sats now 97%

## 2019-07-03 NOTE — ED Triage Notes (Signed)
Pt arrives EMS from [place] with c/o [name] worsening over last 2 days. Pt had ablation 1 week ago and has been turning o2 off to go to bathroom. Pt arrives Diaphoretic and o2 at [place], [place]

## 2019-07-03 NOTE — ED Notes (Signed)
4 Gold tops tubes sent to main lab.

## 2019-07-03 NOTE — ED Provider Notes (Signed)
[facility] EMERGENCY DEPARTMENT Provider Note   CSN: [id number] Arrival date & time: 07/03/19  1711     History   Chief Complaint Chief Complaint  Patient presents with  . Shortness of Breath    HPI [name] is a 84 y.o. female.     [name] is a 84 y.o. female with a history of CHF, respiratory failure, type 2 diabetes, hypertension, hyperlipidemia, A. fib, who presents to the emergency department via EMS for evaluation of shortness of breath.  Patient had a cardioversion a week ago for A. fib and since then has had worsening shortness of breath.  Wears oxygen at home but today felt like she was having difficulty breathing even with this.  Also reports worsening lower extremity swelling.  She has not had any fevers or cough.  She reports constant shortness of breath that is worse with activity, but denies any associated chest pain.  No abdominal pain, nausea, vomiting or diarrhea.  Reports she has been taking all of her medications regularly.  Patient is a resident at [facility].  Patient's daughter is at the bedside.  Patient arrives with DNR paperwork.     Past Medical History:  Diagnosis Date  . Allergic rhinitis   . Anxiety state, unspecified    panic attacks  . CHF (congestive heart failure) ([place])   . Depressive disorder, not elsewhere classified   . Extrinsic asthma, unspecified    no problem since adulthood  . Obesity   . OSA on CPAP    severe  . Pure hypercholesterolemia   . Respiratory failure with hypoxia ([place]) 09/2008   acute, secondary to multiple bilateral pulmonary embolism , negative hypercoagulable workup 09/2008 hospital stay  . Scoliosis   . Syncope and collapse   . Type II or unspecified type diabetes mellitus without mention of complication, not stated as uncontrolled   . Unspecified essential hypertension   . Unspecified hypothyroidism    hypo    Patient Active Problem List   Diagnosis Date Noted  . Atypical  atrial flutter ([place])   . Normocytic anemia 05/18/2017  . Chronic ITP (idiopathic thrombocytopenia) (HCC) 01/25/2017  . Chronic kidney disease (CKD) stage G4/A1, severely decreased glomerular filtration rate (GFR) between 15-29 mL/min/1.73 square meter and albuminuria creatinine ratio less than 30 mg/g (H[facility]) 09/04/2015  . Chronic kidney disease (CKD), stage IV (severe) ([place]) 09/04/2015  . GOUT 06/23/2010  . DERMATITIS 01/16/2010  . BACK PAIN 06/13/2009  . MYALGIA 06/13/2009  . Anxiety state 12/13/2008  . LEG EDEMA, CHRONIC 11/08/2008  . Sleep apnea 10/26/2008  . Hypothyroidism 09/27/2008  . HYPERCHOLESTEROLEMIA 09/27/2008  . OBESITY 09/27/2008  . DEPRESSION 09/27/2008  . Essential hypertension 09/27/2008  . ALLERGIC RHINITIS 09/27/2008  . ASTHMA, CHILDHOOD 09/27/2008    Past Surgical History:  Procedure Laterality Date  . CARDIOVERSION N/A 06/26/2019   Procedure: CARDIOVERSION;  Surgeon: Jerline Pain, MD;  Location: [facility] ENDOSCOPY;  Service: Cardiovascular;  Laterality: N/A;  . EYE SURGERY    . JOINT REPLACEMENT    . knee replaced    . THYROID SURGERY       OB History   No obstetric history on file.      Home Medications    Prior to Admission medications   Medication Sig Start Date End Date Taking? Authorizing Provider  acetaminophen (TYLENOL) 325 MG tablet Take 650 mg by mouth every 6 (six) hours as needed for moderate pain, fever or headache.  [provider]  amoxicillin (AMOXIL) 500 MG capsule Take 1,000 mg by mouth See admin instructions. Take 1000 mg 1 hour prior to dental appointment then 1000 mg after dental appointment    [provider]  apixaban (ELIQUIS) 2.5 MG TABS tablet Take 1 tablet (2.5 mg total) by mouth 2 (two) times daily. 04/18/19   Jerline Pain, MD  atorvastatin (LIPITOR) 40 MG tablet Take 40 mg by mouth at bedtime.     [provider]  bisacodyl (DULCOLAX) 5 MG EC tablet Take 10 mg by mouth See admin instructions. Take  10 mg every 3 days as needed for constipation    [provider]  Cholecalciferol (VITAMIN D3) 2000 units TABS Take 2,000 Units by mouth daily.    [provider]  diltiazem (DILACOR XR) 180 MG 24 hr capsule Take 180 mg by mouth every evening.     [provider]  diphenhydrAMINE (BANOPHEN) 25 MG tablet Take 25 mg by mouth daily as needed ("other adverse food reactions").     [provider]  docusate sodium (COLACE) 100 MG capsule Take 100 mg by mouth daily.  04/30/15   [provider]  Dulaglutide (TRULICITY) 1.5 ZO/1.0RU SOPN Inject 1.5 mg into the skin once a week. Patient taking differently: Inject 1.5 mg into the skin every Sunday.  09/09/18   Philemon Kingdom, MD  fenofibrate 54 MG tablet TAKE 1 TABLET(54 MG) BY MOUTH DAILY Patient taking differently: Take 54 mg by mouth daily.  08/04/17   Nafziger, Tommi Rumps, NP  fexofenadine (ALLEGRA) 180 MG tablet Take 180 mg by mouth at bedtime.     [provider]  Flaxseed, Linseed, (FLAXSEED OIL) 1000 MG CAPS Take 1,000 mg by mouth 2 (two) times daily.     [provider]  furosemide (LASIX) 20 MG tablet Take 20 mg by mouth 4 (four) times a week. Take Sat, Sun, Tue, and Thurs    [provider]  furosemide (LASIX) 40 MG tablet Take 40 mg by mouth every Monday, Wednesday, and Friday.  02/10/19   [provider]  HUMALOG 100 UNIT/ML injection Inject 25-35 Units into the skin See admin instructions. Inject 35 units in the morning, 30 units midday, and 25 units at 1630. If sugars > 90 give full dose, if sugars 60-90 give 1/2 dose (17,15,12), if sugars <60 hold 02/10/19   [provider]  insulin glargine (LANTUS) 100 UNIT/ML injection Inject 0.4 mLs (40 Units total) into the skin daily. Patient taking differently: Inject 40 Units into the skin 2 (two) times daily.  02/22/19   Philemon Kingdom, MD  Insulin Pen Needle (B-D UF III MINI PEN NEEDLES) 31G X 5 MM MISC Use daily for  insulin injection 12/15/16   Nafziger, Tommi Rumps, NP  Insulin Syringe-Needle U-100 (INSULIN SYRINGE 1CC/30GX5/16") 30G X 5/16" 1 ML MISC USE BID UTD 01/27/17   [provider]  levothyroxine (SYNTHROID, LEVOTHROID) 112 MCG tablet TAKE 1 TABLET BY MOUTH DAILY Patient taking differently: Take 112 mcg by mouth daily before breakfast.  11/24/17   Dorothyann Peng, NP  Multiple Vitamin (MULTIVITAMIN) tablet Take 1 tablet by mouth daily.      [provider]  omeprazole (PRILOSEC) 20 MG capsule Take 20 mg by mouth daily.  01/25/19   [provider]  ondansetron (ZOFRAN) 4 MG tablet Take 4 mg by mouth every 8 (eight) hours as needed for nausea or vomiting.    [provider]  Phenylephrine-APAP-guaiFENesin (TYLENOL SINUS SEVERE) 5-325-200 MG  TABS Take 2 tablets by mouth every 4 (four) hours as needed (sinus headaches).    [provider]  polyethylene glycol (MIRALAX / GLYCOLAX) 17 g packet Take 17 g by mouth daily as needed for moderate constipation.    [provider]  potassium citrate (UROCIT-K) 10 MEQ (1080 MG) SR tablet Take 20 mEq by mouth 2 (two) times daily.  11/26/17   [provider]  sertraline (ZOLOFT) 100 MG tablet Take 100 mg by mouth every evening.  05/24/18   [provider]  vitamin C (ASCORBIC ACID) 250 MG tablet Take 250 mg by mouth daily.    [provider]    Family History Family History  Problem Relation Age of Onset  . Allergies Mother   . Clotting disorder Mother   . Osteoarthritis Mother   . Asthma Mother   . Arthritis Other   . Diabetes Other   . Hyperlipidemia Other   . Hypertension Other   . Coronary artery disease Other   . Stroke Other   . Osteoarthritis Daughter   . Rheum arthritis Maternal Grandmother   . Clotting disorder Maternal Grandmother   . Clotting disorder Maternal Uncle   . Clotting disorder Daughter   . Allergies Daughter   . Breast cancer Neg Hx     Social History Social  History   Tobacco Use  . Smoking status: Former Smoker    Packs/day: 0.25    Years: 20.00    Pack years: 5.00    Types: Cigarettes    Quit date: 09/07/1989    Years since quitting: 29.8  . Smokeless tobacco: Never Used  Substance Use Topics  . Alcohol use: No  . Drug use: No     Allergies   Apixaban, Aspirin, Mirabegron, Metformin, Pineapple, Tetracycline, Fluticasone-salmeterol, Iodinated diagnostic agents, Lactose intolerance (gi), Latex, Lisinopril, Metronidazole, Sulfa antibiotics, and Sulfonamide derivatives   Review of Systems Review of Systems  Constitutional: Negative for chills and fever.  HENT: Negative.   Respiratory: Positive for cough and shortness of breath.   Cardiovascular: Positive for leg swelling. Negative for chest pain and palpitations.  Gastrointestinal: Negative for abdominal pain, nausea and vomiting.  Genitourinary: Negative for dysuria.  Musculoskeletal: Negative for arthralgias and myalgias.  Skin: Negative for color change and rash.  Neurological: Negative for syncope, weakness, light-headedness and numbness.  All other systems reviewed and are negative.    Physical Exam Updated Vital Signs BP (!) 205/94 (BP Location: Left Wrist)   Pulse 87   Temp 98.3 F (36.8 C) (Oral)   Resp (!) 32   Ht 5\' 1"  (1.549 m)   Wt 113.4 kg   LMP  (LMP Unknown)   SpO2 100% Comment: on non-rebreather  BMI 47.24 kg/m   Physical Exam Constitutional:      General: She is in acute distress.     Appearance: She is well-developed. She is obese. She is ill-appearing. She is not diaphoretic.     Comments: On arrival patient is in acute respiratory distress on 4 L nasal cannula, critically ill-appearing  HENT:     Head: Normocephalic and atraumatic.     Mouth/Throat:     Pharynx: Oropharynx is clear.  Neck:     Musculoskeletal: Neck supple.  Cardiovascular:     Rate and Rhythm: Normal rate. Rhythm irregular.     Pulses: Normal pulses.     Heart sounds: Normal  heart sounds. No murmur. No friction rub. No gallop.   Pulmonary:  Effort: Tachypnea and respiratory distress present.     Breath sounds: Rales present.     Comments: Patient is in acute respiratory distress with O2 sats in the 70s on 4 L nasal cannula tachypneic with significantly increased respiratory effort.  O2 saturations improved to 100% on nonrebreather. Rales throughout bilateral lung fields with decreased air movement, no wheezing Chest:     Chest wall: No tenderness.  Abdominal:     General: Bowel sounds are normal.     Palpations: Abdomen is soft. There is no hepatomegaly or mass.     Tenderness: There is no abdominal tenderness. There is no guarding.  Musculoskeletal:     Right lower leg: Edema present.     Left lower leg: Edema present.     Comments: Bilateral lower extremities with edema, but warm and well perfused.  Chronic appearing skin changes to the lower legs.  Skin:    General: Skin is warm and dry.  Neurological:     Mental Status: She is alert and oriented to person, place, and time.  Psychiatric:        Mood and Affect: Mood is anxious.        Behavior: Behavior normal.      ED Treatments / Results  Labs (all labs ordered are listed, but only abnormal results are displayed) Labs Reviewed  COMPREHENSIVE METABOLIC PANEL - Abnormal; Notable for the following components:      Result Value   Glucose, Bld 207 (*)    BUN 39 (*)    Creatinine, Ser 2.59 (*)    GFR calc non Af Amer 17 (*)    GFR calc Af Amer 19 (*)    All other components within normal limits  CBC WITH DIFFERENTIAL/PLATELET - Abnormal; Notable for the following components:   WBC 12.6 (*)    RBC 3.49 (*)    Hemoglobin 10.1 (*)    HCT 32.8 (*)    RDW 18.3 (*)    Platelets 64 (*)    Neutro Abs 9.3 (*)    Abs Immature Granulocytes 0.17 (*)    All other components within normal limits  BRAIN NATRIURETIC PEPTIDE - Abnormal; Notable for the following components:   B Natriuretic Peptide 285.2  (*)    All other components within normal limits  APTT - Abnormal; Notable for the following components:   aPTT 40 (*)    All other components within normal limits  PROTIME-INR - Abnormal; Notable for the following components:   Prothrombin Time 16.9 (*)    INR 1.4 (*)    All other components within normal limits  URINALYSIS, ROUTINE W REFLEX MICROSCOPIC - Abnormal; Notable for the following components:   Color, Urine AMBER (*)    APPearance CLOUDY (*)    Hgb urine dipstick LARGE (*)    Protein, ur 30 (*)    Leukocytes,Ua SMALL (*)    All other components within normal limits  URINALYSIS, MICROSCOPIC (REFLEX) - Abnormal; Notable for the following components:   Bacteria, UA MANY (*)    All other components within normal limits  I-STAT CHEM 8, ED - Abnormal; Notable for the following components:   BUN 39 (*)    Creatinine, Ser 2.60 (*)    Glucose, Bld 209 (*)    Hemoglobin 10.9 (*)    HCT 32.0 (*)    All other components within normal limits  POCT I-STAT 7, (LYTES, BLD GAS, ICA,H+H) - Abnormal; Notable for the following components:   pH, Arterial 7.293 (*)  pCO2 arterial 62.1 (*)    Bicarbonate 30.2 (*)    HCT 33.0 (*)    Hemoglobin 11.2 (*)    All other components within normal limits  SARS CORONAVIRUS 2 BY RT PCR (HOSPITAL ORDER, [facility])  CULTURE, BLOOD (ROUTINE X 2)  CULTURE, BLOOD (ROUTINE X 2)  URINE CULTURE  LACTIC ACID, PLASMA  LACTIC ACID, PLASMA  BLOOD GAS, ARTERIAL  TROPONIN I (HIGH SENSITIVITY)  TROPONIN I (HIGH SENSITIVITY)    EKG EKG Interpretation  Date/Time:  Monday July 03 2019 17:28:32 EDT Ventricular Rate:  82 PR Interval:    QRS Duration: 122 QT Interval:  423 QTC Calculation: 495 R Axis:   23 Text Interpretation: Atrial fibrillation Nonspecific intraventricular conduction delay Consider inferior infarct Probable anteroseptal infarct, old Nonspecific T abnormalities, lateral leads Borderline ST elevation,  lateral leads Baseline wander in lead(s) V3 When compared to prior, more wandering baseline. No STEMI Confirmed by Antony Blackbird [phone]) on 07/03/2019 8:36:32 PM   Radiology Dg Chest Port 1 View  Result Date: 07/03/2019 CLINICAL DATA:  Shortness of breath EXAM: PORTABLE CHEST 1 VIEW COMPARISON:  09/15/2017 FINDINGS: Grossly stable cardiomediastinal contours. Calcific aortic knob. There is pulmonary vascular congestion with diffuse airspace opacities bilaterally. No large pleural fluid collection. Patient positioning partially obscures the lung apices. No pneumothorax identified within these limitations. IMPRESSION: Diffuse bilateral airspace opacities which may reflect pulmonary alveolar edema versus multifocal pneumonia. Electronically Signed   By: Davina Poke M.D.   On: 07/03/2019 17:33    Procedures .Critical Care Performed by: Jacqlyn Larsen, PA-C Authorized by: Jacqlyn Larsen, PA-C   Critical care provider statement:    Critical care time (minutes):  45   Critical care was time spent personally by me on the following activities:  Discussions with consultants, evaluation of patient's response to treatment, examination of patient, ordering and performing treatments and interventions, ordering and review of laboratory studies, ordering and review of radiographic studies, pulse oximetry, re-evaluation of patient's condition, obtaining history from patient or surrogate and review of old charts   (including critical care time)  Medications Ordered in ED Medications  nitroGLYCERIN 50 mg in dextrose 5 % 250 mL (0.2 mg/mL) infusion (0 mcg/min Intravenous Stopped 07/03/19 1959)  albuterol (VENTOLIN HFA) 108 (90 Base) MCG/ACT inhaler 4 puff (4 puffs Inhalation Not Given 07/03/19 1829)  ipratropium-albuterol (DUONEB) 0.5-2.5 (3) MG/3ML nebulizer solution 3 mL (3 mLs Nebulization Given 07/03/19 1938)  vancomycin (VANCOCIN) 2,500 mg in sodium chloride 0.9 % 500 mL IVPB (has no administration  in time range)  ceFEPIme (MAXIPIME) 2 g in sodium chloride 0.9 % 100 mL IVPB (has no administration in time range)  vancomycin (VANCOCIN) 1,500 mg in sodium chloride 0.9 % 500 mL IVPB (has no administration in time range)  nitroGLYCERIN (NITROSTAT) SL tablet 0.4 mg (0.4 mg Sublingual Given 07/03/19 1733)  furosemide (LASIX) injection 40 mg (40 mg Intravenous Given 07/03/19 1827)  ceFEPIme (MAXIPIME) 2 g in sodium chloride 0.9 % 100 mL IVPB (2 g Intravenous New Bag/Given 07/03/19 2006)     Initial Impression / Assessment and Plan / ED Course  I have reviewed the triage vital signs and the nursing notes.  Pertinent labs & imaging results that were available during my care of the patient were reviewed by me and considered in my medical decision making (see chart for details).  84 yo F presents via EMS is acute respiratory distress, satting in the 70s on arrival on 4L [place], tachypneic  with significantly increased work of breathing, critically ill on arrival. Patient placed on non-rebreather with improvement in O2 sats to 100%, but patient remains tachypneic.  She has rales present in bilateral lung fields and also has swollen lower extremities.  Patient noted to be hypertensive at 205/94 on arrival.  In transit with EMS patient did not have the same level of respiratory distress and I am concerned for flash pulmonary edema, likely in the setting of hypertensive urgency.  Patient with known heart failure history.  She has not had fevers or cough, had negative Covid test last week before cardioversion.  Denies associated chest pain.  Work-up initiated including rapid Covid test, basic labs, troponin, BNP, lactic acid, blood cultures, and portable chest x-ray.  EKG shows rate controlled A. fib without ischemic changes.    In the setting of hypertension with sudden onset respiratory distress concern for flash pulmonary edema chest x-ray shows likely pulmonary edema, will give sublingual nitro and start patient  on nitro drip to help reduce blood pressure and prevent worsening of pulmonary edema.  Chest x-ray read as bilateral airspace opacities concerning for pulmonary edema versus multifocal pneumonia, given concern for potential pneumonia will also cover with antibiotics.  Called to bedside by nursing staff patient with increased work of breathing on nonrebreather, becoming obtunded, I am very concerned that the patient will tire out and she is at extremely high risk for respiratory arrest and is DNR/DNI, I feel that it is the best interest of the patient to start the patient on BiPAP prior to return of negative Covid test as patient is not a candidate for intubation.  I discussed this with the respiratory therapist who was in agreement.  Patient started on BiPAP and given DuoNeb with significant improvement in work of breathing.  Vitals continue to stabilize.  Blood pressure improving on nitro drip.  Initial troponin is not elevated at 14.  Blood gas shows a pH of 7.29 with some CO2 retention, question if patient could have component of COPD although she is not wheezing on exam.  I-STAT Chem-8 shows creatinine of 2.6 which appears to be at patient's baseline, no significant electrolyte derangements.  Patient with mild leukocytosis of 12.6, stable hemoglobin.  BNP mildly elevated at 285.2.  On reevaluation patient has significantly improved on BiPAP she has resting, but alert and able to answer questions.  Blood pressure has improved to systolic of 557D, nitro drip stopped, patient has been given Lasix.  Will consult for medical admission.  Case discussed with internal medicine teaching service who will see and admit the patient.  Patient discussed with Dr. Sherry Ruffing, who saw patient as well and agrees with plan.   Final Clinical Impressions(s) / ED Diagnoses   Final diagnoses:  Acute respiratory failure with hypoxia [facility])  Hypertensive emergency  Acute pulmonary edema [facility])    ED Discharge Orders     None       Jacqlyn Larsen, [place] 07/04/19 0032    Tegeler, Gwenyth Allegra, MD 07/04/19 [phone]

## 2019-07-03 NOTE — H&P (Addendum)
Date: 07/03/2019               Patient Name:  [name] MRN: [id number]  DOB: [birth year] Age / Sex: 84 y.o., female   PCP: Javier Glazier, MD         Medical Service: Internal Medicine Teaching Service         Attending Physician: Dr. Aldine Contes, MD    First Contact: Charleen Kirks, MD, Albertine Grates Pager: IB [phone])  Second Contact: Myrtie Hawk, MD, [place] Pager: EM [phone])       After Hours (After 5p/  First Contact Pager: [phone]  weekends / holidays): Second Contact Pager: [phone]   Chief Complaint: shortness of breath   History of Present Illness: 84 y.o. female w/ PMHx of HTN, DMII, A. Fib, CKDIV, CHF, OSA and hypothyroidism presenting with one week history of dyspnea with increased oxygen requirement. History obtained by patient, daughter and chart review. Patient had a recent  admission on 10/19 for cardioversion of atypical atrial flutter without any complications. Since her cardioversion, the patient has been on 2L O2 at her nursing facility. Patient was given Lasix and albuterol with symptomatic relief but continued to require supplemental oxygen. Patient's dyspnea has been progressively worsening over the past few days. Per daughter, patient called her around 3:30pm with complaints of sudden onset shortness of breath. She was subsequently brought in via EMS.  She denies any fevers/chills, generalized fatigue, productive cough, headache, dizziness/lightheadedness, chest pain, abdominal pain, nausea/vomiting, diarrhea, or urinary symptoms. No sick contacts.   Of note, patient was evaluated for continuous oxygen requirement of 2L at her facility on 10/20 and 10/23. Initially thought it was secondary to her missing her dose of Lasix which was given. However, patient continued to be hypoxic. On 10/23, NP noted unilateral crackles but presumed it was secondary to viral bronchitis as CXR  was negative for infiltrates. Encouraged adequate hydration with goal of weaning  off O2 today.  ED course:  Patient presented to ED via EMS from [facility] with shortness of breath. She was diaphoretic with increasing O2 requirement and respiratory distress, requiring BiPAP. CXR concerning for pulmonary edema vs pneumonia. Patient given duonebs, albuterol, vancomycin, cefepime and lasix. She was also noted to be hypertensive on arrival and was started on nitro gtt with subsequent normalization of BP. Patient admitted to internal service for further evaluation and management.    Meds:  Current Meds  Medication Sig   acetaminophen (TYLENOL) 325 MG tablet Take 650 mg by mouth every 6 (six) hours as needed for moderate pain or fever (for fever or pain/discomfort).    amoxicillin (AMOXIL) 500 MG capsule Take 1,000-2,000 mg by mouth See admin instructions. Take 1,000 mg by mouth one hour before dental appointments and 1,000 mg afterwards AS DIRECTED   apixaban (ELIQUIS) 2.5 MG TABS tablet Take 1 tablet (2.5 mg total) by mouth 2 (two) times daily.   atorvastatin (LIPITOR) 40 MG tablet Take 40 mg by mouth at bedtime.    bisacodyl (DULCOLAX) 5 MG EC tablet Take 10 mg by mouth every 3 (three) days as needed (for constipation).    Cholecalciferol (VITAMIN D3) 2000 units TABS Take 2,000 Units by mouth daily.   Continuous Blood Gluc Sensor (FREESTYLE LIBRE 14 DAY SENSOR) MISC Inject 1 patch into the skin every 14 (fourteen) days.   diltiazem (DILACOR XR) 180 MG 24 hr capsule Take 180 mg by mouth every evening.    diphenhydrAMINE (BANOPHEN) 25 MG tablet Take 25 mg  by mouth as needed ("for adverse food reactions").    docusate sodium (COLACE) 100 MG capsule Take 100 mg by mouth daily.    Dulaglutide (TRULICITY) 1.5 UM/3.5TI SOPN Inject 1.5 mg into the skin once a week. (Patient taking differently: Inject 1.5 mg into the skin every Sunday. )   fenofibrate 54 MG tablet TAKE 1 TABLET(54 MG) BY MOUTH DAILY (Patient taking differently: Take 54 mg by mouth daily. )   fexofenadine  (ALLEGRA) 180 MG tablet Take 180 mg by mouth at bedtime.    Flaxseed, Linseed, (FLAXSEED OIL) 1000 MG CAPS Take 1,000 mg by mouth 2 (two) times daily.    furosemide (LASIX) 20 MG tablet Take 20 mg by mouth See admin instructions. Take 20 mg by mouth in the morning on Sun/Tues/Thurs/Sat   furosemide (LASIX) 40 MG tablet Take 40 mg by mouth See admin instructions. Take 40 mg by mouth in the morning on Mon/Wed/Fri   HUMALOG 100 UNIT/ML injection Inject 25-35 Units into the skin See admin instructions. "Inject 35 units into the skin before breakfast, 30 units before lunch, and 25 units before supper, per sliding scale: BGL >90 = give the full dose for a regular meal, 60-90 = 17 units before breakfast, 15 units before lunch, and 12 units before supper; hold if <60"   insulin glargine (LANTUS) 100 UNIT/ML injection Inject 0.4 mLs (40 Units total) into the skin daily. (Patient taking differently: Inject 40 Units into the skin 2 (two) times daily. )   levothyroxine (SYNTHROID, LEVOTHROID) 112 MCG tablet TAKE 1 TABLET BY MOUTH DAILY (Patient taking differently: Take 112 mcg by mouth daily before breakfast. )   Multiple Vitamin (MULTIVITAMIN) tablet Take 1 tablet by mouth daily.     omeprazole (PRILOSEC) 20 MG capsule Take 20 mg by mouth daily.    ondansetron (ZOFRAN) 4 MG tablet Take 4 mg by mouth every 8 (eight) hours as needed for nausea or vomiting.   oxymetazoline (AFRIN) 0.05 % nasal spray Place 2 sprays into both nostrils every 12 (twelve) hours as needed (for nose bleeds).   Phenylephrine-APAP-guaiFENesin (TYLENOL SINUS SEVERE) 5-325-200 MG TABS Take 2 tablets by mouth every 4 (four) hours as needed (sinus headaches).   polyethylene glycol (MIRALAX / GLYCOLAX) 17 g packet Take 17 g by mouth daily as needed (for constipation).    potassium citrate (UROCIT-K) 10 MEQ (1080 MG) SR tablet Take 10 mEq by mouth 2 (two) times daily.   sertraline (ZOLOFT) 100 MG tablet Take 100 mg by mouth every  evening.    vitamin C (ASCORBIC ACID) 250 MG tablet Take 250 mg by mouth daily.    Allergies: Allergies as of 07/03/2019 - Review Complete 07/03/2019  Allergen Reaction Noted   Adhesive [tape] Other (See Comments) 07/03/2019   Apixaban Other (See Comments) 06/21/2017   Aspirin Itching, Rash, Hives, and Swelling 04/15/2015   Mirabegron Other (See Comments) 06/21/2017   Pineapple Anaphylaxis and Swelling 08/28/2015   Metformin Diarrhea and Nausea Only 01/07/2009   Tetracycline Hives 04/08/2015   Fluticasone-salmeterol Itching and Rash 10/26/2008   Iodinated diagnostic agents Itching and Rash 04/03/2015   Lactose intolerance (gi) Other (See Comments) 06/11/2015   Latex Itching, Rash, and Other (See Comments) 04/08/2015   Lisinopril Cough 04/08/2015   Metronidazole Other (See Comments) 04/03/2015   Sulfa antibiotics Rash 04/08/2015   Sulfonamide derivatives Itching and Rash 11/17/2018   Past Medical History:  Past Medical History:  Diagnosis Date   Allergic rhinitis    Anxiety state, unspecified  panic attacks   CHF (congestive heart failure) (HCC)    Depressive disorder, not elsewhere classified    Extrinsic asthma, unspecified    no problem since adulthood   Obesity    OSA on CPAP    severe   Pure hypercholesterolemia    Respiratory failure with hypoxia ([place]) 09/2008   acute, secondary to multiple bilateral pulmonary embolism , negative hypercoagulable workup 09/2008 hospital stay   Scoliosis    Syncope and collapse    Type II or unspecified type diabetes mellitus without mention of complication, not stated as uncontrolled    Unspecified essential hypertension    Unspecified hypothyroidism    hypo    Family History:  Family History  Problem Relation Age of Onset   Allergies Mother    Clotting disorder Mother    Osteoarthritis Mother    Asthma Mother    Arthritis Other    Diabetes Other    Hyperlipidemia Other    Hypertension  Other    Coronary artery disease Other    Stroke Other    Osteoarthritis Daughter    Rheum arthritis Maternal Grandmother    Clotting disorder Maternal Grandmother    Clotting disorder Maternal Uncle    Clotting disorder Daughter    Allergies Daughter    Breast cancer Neg Hx      Social History:  Social History   Tobacco Use   Smoking status: Former Smoker    Packs/day: 0.25    Years: 20.00    Pack years: 5.00    Types: Cigarettes    Quit date: 09/07/1989    Years since quitting: 29.8   Smokeless tobacco: Never Used  Substance Use Topics   Alcohol use: No   Drug use: No   Patient is currently residing at [facility].   Review of Systems: A complete ROS was negative except as per HPI.   Physical Exam: Blood pressure 128/61, pulse 65, temperature 98.3 F (36.8 C), temperature source Oral, resp. rate (!) 21, height 5\' 1"  (1.549 m), weight 113.4 kg, SpO2 98 %. Physical Exam Vitals signs and nursing note reviewed.  Constitutional:      General: She is not in acute distress.    Appearance: She is obese. She is ill-appearing. She is not toxic-appearing or diaphoretic.  HENT:     Head: Normocephalic and atraumatic.  Eyes:     Extraocular Movements: Extraocular movements intact.  Neck:     Musculoskeletal: Normal range of motion and neck supple.     Vascular: JVD present.     Comments: JVD present to earlobe with patient in upright position Cardiovascular:     Rate and Rhythm: Normal rate. Rhythm irregular.     Pulses: Normal pulses.     Heart sounds: Normal heart sounds. No murmur. No friction rub. No gallop.   Pulmonary:     Effort: Pulmonary effort is normal. No accessory muscle usage.     Breath sounds: Examination of the right-upper field reveals rales. Examination of the left-upper field reveals rales. Examination of the right-middle field reveals rales. Examination of the left-middle field reveals rales. Examination of the right-lower field  reveals rales. Examination of the left-lower field reveals rales. Rales present.  Chest:     Chest wall: No tenderness or crepitus.  Abdominal:     General: Bowel sounds are normal.     Palpations: Abdomen is soft.     Tenderness: There is no abdominal tenderness. There is no guarding or rebound.  Musculoskeletal:  Normal range of motion.     Right lower leg: She exhibits no tenderness.     Left lower leg: She exhibits no tenderness.     Comments: RLE: 2+ pitting edema to the thigh LLE: 2+ pitting edema to the knee   Skin:    General: Skin is warm and dry.     Capillary Refill: Capillary refill takes less than 2 seconds.     Comments: Chronic venous stasis   Neurological:     General: No focal deficit present.     Mental Status: She is alert and oriented to person, place, and time.    CBC    Component Value Date/Time   WBC 12.6 (H) 07/03/2019 1720   RBC 3.49 (L) 07/03/2019 1720   HGB 10.9 (L) 07/03/2019 1735   HGB 11.2 (L) 07/03/2019 1735   HGB 9.3 (L) 06/30/2019 1400   HGB 11.0 (L) 09/08/2017 1317   HCT 32.0 (L) 07/03/2019 1735   HCT 33.0 (L) 07/03/2019 1735   HCT 33.2 (L) 09/08/2017 1317   PLT 64 (L) 07/03/2019 1720   PLT 155 06/30/2019 1400   PLT 75 (L) 09/08/2017 1317   PLT 93 (LL) 01/12/2017 1450   MCV 94.0 07/03/2019 1720   MCV 99 09/08/2017 1317   MCH 28.9 07/03/2019 1720   MCHC 30.8 07/03/2019 1720   RDW 18.3 (H) 07/03/2019 1720   RDW 17.1 (H) 09/08/2017 1317   LYMPHSABS 2.1 07/03/2019 1720   LYMPHSABS 1.2 09/08/2017 1317   MONOABS 0.8 07/03/2019 1720   EOSABS 0.2 07/03/2019 1720   EOSABS 0.3 09/08/2017 1317   BASOSABS 0.1 07/03/2019 1720   BASOSABS 0.0 09/08/2017 1317   CMP     Component Value Date/Time   NA 140 07/03/2019 1735   NA 140 07/03/2019 1735   NA 145 09/08/2017 1317   NA 142 02/25/2017 1013   K 3.8 07/03/2019 1735   K 3.8 07/03/2019 1735   K 4.5 09/08/2017 1317   K 3.8 02/25/2017 1013   CL 98 07/03/2019 1735   CL 98 09/08/2017 1317    CO2 30 07/03/2019 1720   CO2 32 09/08/2017 1317   CO2 26 02/25/2017 1013   GLUCOSE 209 (H) 07/03/2019 1735   GLUCOSE 251 (H) 09/08/2017 1317   BUN 39 (H) 07/03/2019 1735   BUN 34 (H) 09/08/2017 1317   BUN 20.1 02/25/2017 1013   CREATININE 2.60 (H) 07/03/2019 1735   CREATININE 2.56 (H) 05/25/2019 1401   CREATININE 1.9 (H) 09/08/2017 1317   CREATININE 1.8 (H) 02/25/2017 1013   CALCIUM 9.3 07/03/2019 1720   CALCIUM 9.3 09/08/2017 1317   CALCIUM 8.7 02/25/2017 1013   PROT 8.1 07/03/2019 1720   PROT 7.9 09/08/2017 1317   PROT 7.6 02/25/2017 1013   ALBUMIN 3.8 07/03/2019 1720   ALBUMIN 3.8 09/08/2017 1317   ALBUMIN 4.0 04/23/2017 1336   ALBUMIN 3.7 02/25/2017 1013   AST 33 07/03/2019 1720   AST 18 05/25/2019 1401   AST 16 02/25/2017 1013   ALT 30 07/03/2019 1720   ALT 12 05/25/2019 1401   ALT 26 09/08/2017 1317   ALT 13 02/25/2017 1013   ALKPHOS 67 07/03/2019 1720   ALKPHOS 45 09/08/2017 1317   ALKPHOS 65 02/25/2017 1013   BILITOT 0.8 07/03/2019 1720   BILITOT 0.7 05/25/2019 1401   BILITOT 0.76 02/25/2017 1013   GFRNONAA 17 (L) 07/03/2019 1720   GFRNONAA 17 (L) 05/25/2019 1401   GFRAA 19 (L) 07/03/2019 1720   GFRAA 20 (  L) 05/25/2019 1401   ABG    Component Value Date/Time   PHART 7.293 (L) 07/03/2019 1735   PCO2ART 62.1 (H) 07/03/2019 1735   PO2ART 101.0 07/03/2019 1735   HCO3 30.2 (H) 07/03/2019 1735   TCO2 30 07/03/2019 1735   TCO2 32 07/03/2019 1735   O2SAT 97.0 07/03/2019 1735    EKG: personally reviewed my interpretation is sinus rhythm, HR 64, QTc 513  CXR: personally reviewed my interpretation is bilateral opacification with costophrenic angle blunting. Pulmonary vascular congestion.   Assessment & Plan by Problem:  Patient is a 84yo female with PMHx of HTN, CKD IV, CHF, OSA and A.fib presenting with dyspnea and increasing oxygen requirement for one week duration that has acutely worsened since this afternoon. Patient is on BiPAP and physical examination  consistent with volume overload. CXR concerning for pulmonary edema vs bilateral pneumonia. Initial ABG with respiratory acidosis. Labs with neutrophil predominant leukocytosis. Concerns for CHF exacerbation vs pneumonia.   Acute hypoxic respiratory failure 2/2 CHF exacerbation vs. PNA:  Patient presented with sudden onset dyspnea in setting of increased O2 requirement. She is on BiPAP during examination. Patient does not appear to be in respiratory distress and is otherwise hemodynamically stable. She does appear to be volume overloaded with significant JVD, diffuse pulmonary crackles, and bilateral pitting edema. CXR with pulmonary edema vs bilateral pneumonia Patient has a history of HFpEF (Echo 01/2017 with EF 65-70%). She is on an alternating schedule of 40mg  and 20mg  of Lasix daily and reports compliance. She was noted to be severely hypertensive on admission with SBP>200 and placed on nitro gtt with subsequent improvement.  Given her fluid overload status, it is likely that her symptoms are secondary to CHF exacerbation with flash pulmonary edema as prior CXR on 10/23 at her facility was negative for any infiltrates.  Patient is afebrile and does not complain of a cough or recent sick contacts, less likely that symptoms are due to pneumonia. Labs were significant for neutrophil predominant leukocytosis which could be reactive however, cannot r/o pneumonia at this point.  - Continue BiPAP and wean as tolerated - IV Lasix 40mg  qd - Continue vancomycin and cefepime  - CBC, BMP - Duoneb 54ml 4h - Strict I/O's and daily weights - consider repeating CXR after diuresis   QT Prolongation on EKG:  Patient with recent cardioversion for atypical atrial flutter. Her initial EKG on presentation with QTc 496 and repeat EKG with QTc 513. She is on zoloft and diphenhydramine at home. She also received antibiotics in the ED which could be contributing to the prolongation. Magnesium is wnl.  - Cardiac  monitoring - Diltiazem 180mg  qd - Avoid QT prolonging agents   Troponinemia:  Elevated troponin (14 > 132). Suspect this is secondary to demand ischemia in setting of hypertensive urgency on presentation. Patient is hemodynamically stable. She denies any chest pain at this time. No ischemic changes noted on EKG.  - Continue to monitor   Diabetes mellitus:  Patient with history of DMII with insulin requirement. Patient reports taking Lantus 44U in AM and 44U in PM as well as Humalin SSI.  - CBG monitoring - Lantus 40U + SSI   CKD Stage IV:  Patient with Hx of CKD IV. BUN/Cr 39/2.6 with GFR 17. Appears to be stable.  - Avoid nephrotoxic agents - BMP  Hypertension:  Patient with history of hypertension and initially presented with SBP >200. She was placed on nitro gtt in the ED with subsequent normalization of pressures.  On examination, patient was normotensive.  - Continue diltiazem   Thrombocytopenia:  Plt 64 on admission. Patient has a history of ITP with platelet range 28-121k in the past. She receives weekly N-plate infusions and last one was on 10/23.  She was previously not taking anticoagulation  despite her history of atrial fibrillation due to issues with bleeding and thrombocytopenia. Patient follows in A.fib clinic and per chart review, patient has been taking her Eliquis since August 2020 and reports continued use since her cardioversion. Patient denies easily bruising or bleeding and no overt bleeding noted on exam.  - Continue to monitor  - Holding Eliquis for now   Diet: NPO as patient is on BiPAP Code: DNR VTE Prophylaxis: SCD's  Dispo: Admit patient to Inpatient with expected length of stay greater than 2 midnights.  Signed: Harvie Heck, MD  Internal Medicine, PGY-1  07/03/2019, 9:37 PM  Pager: [phone]

## 2019-07-03 NOTE — Telephone Encounter (Signed)
Talked with [name] - states patient is on continuous oxygen at 2L since day of cardioversion. Pt is not in distress no fever no cough or other symptoms noted. Does endorse 3lbs weight loss. CXR performed over weekend report to be sent. Does hear crackles in right middle lobe of lung. Discussed with Roderic Palau NP - will assess at appointment tomorrow. [place] notified.

## 2019-07-03 NOTE — ED Notes (Signed)
Called main lab, added PT, PTT to blood in llab.

## 2019-07-03 NOTE — ED Notes (Signed)
Respiratory at bedside.

## 2019-07-03 NOTE — Telephone Encounter (Signed)
Patient had heart ablation recently and has been put on supplemental O2.  She states has been eating significantly less and her blood sugars are "tanking". She had a midday blood sugar of 57.  Patient is concerned about consistent low blood sugar and would like advise on how to adjust her medications etc so as not to have continued low blood sugars  Phone number for call back and assistance is [phone]

## 2019-07-03 NOTE — Telephone Encounter (Signed)
Pt advsied she has been on oxygen since cardioversion. I am on 2L oxygen continuously." Reviewed pt concern with Judson Roch, NP discussed with pt the nose bleeds are likely due to the oxygen not being humidified, causing the nasal passages to dry out. Pt agreed, Oxygen is not humidified and causing a lot of dryness.  Encouraged pt to get a humidifier for her room and line nasal passage with neosporin to help keep moisturized. Pt verbalized understanding.

## 2019-07-03 NOTE — Telephone Encounter (Signed)
Elmo Putt at [facility] needs to speak to the nurse because the patient has an appt tomorrow but they have been unable to take her off oxygen since the cardioversion. She would like to discuss concerns with nurse.

## 2019-07-03 NOTE — ED Notes (Signed)
Patient having increased work of breathing while on non-rebreather. Dr. Sherry Ruffing and PA, Marijean Bravo at bedside at this time discussing plan of care with patients daughter.

## 2019-07-03 NOTE — ED Notes (Signed)
Date and time results received: 07/03/19  (use smartphrase ".now" to insert current time)  Test: troponin Critical Value: 132  Name of Provider Notified: Benedetto Goad, PA & Dr. Sherry Ruffing  Orders Received? Or Actions Taken?: awating new orders

## 2019-07-03 NOTE — Progress Notes (Signed)
Pharmacy Antibiotic Note  [name] is a 84 y.o. female admitted on 07/03/2019 with sepsis.  Pharmacy has been consulted for Vancomycin and Cefepime dosing. Patient with CKD, SCr 2.60 (may be patient's baseline) with estimated CrCl ~85mL/min.   Plan: Cefepime 2g IV x1 now. Then Cefepime 2g IV every 24 hours.  Vancomycin 2500mg  IV x1 now. Then Vancomycin 1500mg  IV every 48 hours.  Monitor renal function, clinical status and culture results Check Vancomycin levels as appropriate  Height: 5\' 1"  (154.9 cm) Weight: 250 lb (113.4 kg) IBW/kg (Calculated) : 47.8  Temp (24hrs), Avg:98.3 F (36.8 C), Min:98.3 F (36.8 C), Max:98.3 F (36.8 C)  Recent Labs  Lab 06/30/19 1400 07/03/19 1720 07/03/19 1735  WBC 6.4 12.6*  --   CREATININE  --  2.59* 2.60*  LATICACIDVEN  --  1.9  --     Estimated Creatinine Clearance: 19.8 mL/min (A) (by C-G formula based on SCr of 2.6 mg/dL (H)).    Allergies  Allergen Reactions  . Apixaban Other (See Comments)    Internal Bleeding  . Aspirin Itching, Rash, Hives and Swelling    Swelling of her tongue  . Mirabegron Other (See Comments)    Patient experienced A-Fib  . Metformin Diarrhea and Nausea Only  . Pineapple Swelling    Throat swells and blisters on tongue and roof of mouth per patient  . Tetracycline Hives  . Fluticasone-Salmeterol Itching and Rash  . Iodinated Diagnostic Agents Itching and Rash       . Lactose Intolerance (Gi) Other (See Comments)    gas  . Latex Itching, Rash and Other (See Comments)    Pt. States latex pulls her skin off.   . Lisinopril Cough  . Metronidazole Other (See Comments)    Unknown   . Sulfa Antibiotics Rash  . Sulfonamide Derivatives Itching and Rash    Antimicrobials this admission: Vancomycin 10/26 >> Cefepime 10/26 >>  Dose adjustments this admission:   Microbiology results: 10/26 BCx >> 10/26 UCx >> 10/26 COVID >>  Thank you for allowing pharmacy to be a part of this patient's  care.  Sloan Leiter, PharmD, BCPS, BCCCP Clinical Pharmacist Clinical phone 07/03/2019 until 10:30P [phone] Please refer to [facility] for [place] numbers 07/03/2019 6:36 PM

## 2019-07-04 ENCOUNTER — Inpatient Hospital Stay (HOSPITAL_COMMUNITY): Payer: Medicare Other

## 2019-07-04 ENCOUNTER — Ambulatory Visit (HOSPITAL_COMMUNITY): Payer: Medicare Other | Admitting: Nurse Practitioner

## 2019-07-04 DIAGNOSIS — G4733 Obstructive sleep apnea (adult) (pediatric): Secondary | ICD-10-CM

## 2019-07-04 DIAGNOSIS — R778 Other specified abnormalities of plasma proteins: Secondary | ICD-10-CM

## 2019-07-04 DIAGNOSIS — Z888 Allergy status to other drugs, medicaments and biological substances status: Secondary | ICD-10-CM

## 2019-07-04 DIAGNOSIS — Z91048 Other nonmedicinal substance allergy status: Secondary | ICD-10-CM

## 2019-07-04 DIAGNOSIS — R9431 Abnormal electrocardiogram [ECG] [EKG]: Secondary | ICD-10-CM

## 2019-07-04 DIAGNOSIS — I130 Hypertensive heart and chronic kidney disease with heart failure and stage 1 through stage 4 chronic kidney disease, or unspecified chronic kidney disease: Secondary | ICD-10-CM

## 2019-07-04 DIAGNOSIS — Z794 Long term (current) use of insulin: Secondary | ICD-10-CM

## 2019-07-04 DIAGNOSIS — Z886 Allergy status to analgesic agent status: Secondary | ICD-10-CM

## 2019-07-04 DIAGNOSIS — E039 Hypothyroidism, unspecified: Secondary | ICD-10-CM

## 2019-07-04 DIAGNOSIS — Z91040 Latex allergy status: Secondary | ICD-10-CM

## 2019-07-04 DIAGNOSIS — E1151 Type 2 diabetes mellitus with diabetic peripheral angiopathy without gangrene: Secondary | ICD-10-CM

## 2019-07-04 DIAGNOSIS — Z79899 Other long term (current) drug therapy: Secondary | ICD-10-CM

## 2019-07-04 DIAGNOSIS — Z66 Do not resuscitate: Secondary | ICD-10-CM

## 2019-07-04 DIAGNOSIS — Z881 Allergy status to other antibiotic agents status: Secondary | ICD-10-CM

## 2019-07-04 DIAGNOSIS — I4891 Unspecified atrial fibrillation: Secondary | ICD-10-CM

## 2019-07-04 DIAGNOSIS — Z91041 Radiographic dye allergy status: Secondary | ICD-10-CM

## 2019-07-04 DIAGNOSIS — Z91018 Allergy to other foods: Secondary | ICD-10-CM

## 2019-07-04 DIAGNOSIS — Z87891 Personal history of nicotine dependence: Secondary | ICD-10-CM

## 2019-07-04 LAB — CBC
HCT: 27 % — ABNORMAL LOW (ref 36.0–46.0)
HCT: 28 % — ABNORMAL LOW (ref 36.0–46.0)
Hemoglobin: 8.1 g/dL — ABNORMAL LOW (ref 12.0–15.0)
Hemoglobin: 8.6 g/dL — ABNORMAL LOW (ref 12.0–15.0)
MCH: 29.1 pg (ref 26.0–34.0)
MCH: 29.2 pg (ref 26.0–34.0)
MCHC: 30 g/dL (ref 30.0–36.0)
MCHC: 30.7 g/dL (ref 30.0–36.0)
MCV: 97.1 fL (ref 80.0–100.0)
MCV: 94.9 fL (ref 80.0–100.0)
Platelets: 46 10*3/uL — ABNORMAL LOW (ref 150–400)
Platelets: 54 10*3/uL — ABNORMAL LOW (ref 150–400)
RBC: 2.78 MIL/uL — ABNORMAL LOW (ref 3.87–5.11)
RBC: 2.95 MIL/uL — ABNORMAL LOW (ref 3.87–5.11)
RDW: 18.5 % — ABNORMAL HIGH (ref 11.5–15.5)
RDW: 18.4 % — ABNORMAL HIGH (ref 11.5–15.5)
WBC: 10.8 10*3/uL — ABNORMAL HIGH (ref 4.0–10.5)
WBC: 11.8 10*3/uL — ABNORMAL HIGH (ref 4.0–10.5)
nRBC: 0 % (ref 0.0–0.2)
nRBC: 0 % (ref 0.0–0.2)

## 2019-07-04 LAB — CBG MONITORING, ED
Glucose-Capillary: 235 mg/dL — ABNORMAL HIGH (ref 70–99)
Glucose-Capillary: 223 mg/dL — ABNORMAL HIGH (ref 70–99)
Glucose-Capillary: 230 mg/dL — ABNORMAL HIGH (ref 70–99)
Glucose-Capillary: 204 mg/dL — ABNORMAL HIGH (ref 70–99)

## 2019-07-04 LAB — BASIC METABOLIC PANEL
Anion gap: 11 (ref 5–15)
BUN: 42 mg/dL — ABNORMAL HIGH (ref 8–23)
CO2: 28 mmol/L (ref 22–32)
Calcium: 8.2 mg/dL — ABNORMAL LOW (ref 8.9–10.3)
Chloride: 104 mmol/L (ref 98–111)
Creatinine, Ser: 2.56 mg/dL — ABNORMAL HIGH (ref 0.44–1.00)
GFR calc Af Amer: 20 mL/min — ABNORMAL LOW (ref >60)
GFR calc non Af Amer: 17 mL/min — ABNORMAL LOW (ref >60)
Glucose, Bld: 188 mg/dL — ABNORMAL HIGH (ref 70–99)
Potassium: 4 mmol/L (ref 3.5–5.1)
Sodium: 143 mmol/L (ref 135–145)

## 2019-07-04 LAB — POCT I-STAT 7, (LYTES, BLD GAS, ICA,H+H)
Acid-Base Excess: 8 mmol/L — ABNORMAL HIGH (ref 0.0–2.0)
Bicarbonate: 33.7 mmol/L — ABNORMAL HIGH (ref 20.0–28.0)
Calcium, Ion: 1.15 mmol/L (ref 1.15–1.40)
HCT: 24 % — ABNORMAL LOW (ref 36.0–46.0)
Hemoglobin: 8.2 g/dL — ABNORMAL LOW (ref 12.0–15.0)
O2 Saturation: 95 %
Potassium: 4.4 mmol/L (ref 3.5–5.1)
Sodium: 140 mmol/L (ref 135–145)
TCO2: 35 mmol/L — ABNORMAL HIGH (ref 22–32)
pCO2 arterial: 50.4 mmHg — ABNORMAL HIGH (ref 32.0–48.0)
pH, Arterial: 7.433 (ref 7.350–7.450)
pO2, Arterial: 75 mmHg — ABNORMAL LOW (ref 83.0–108.0)

## 2019-07-04 LAB — PROCALCITONIN: Procalcitonin: 4.65 ng/mL

## 2019-07-04 LAB — TSH: TSH: 0.838 u[IU]/mL (ref 0.350–4.500)

## 2019-07-04 LAB — TROPONIN I (HIGH SENSITIVITY)
Troponin I (High Sensitivity): 316 ng/L (ref <18)
Troponin I (High Sensitivity): 422 ng/L (ref <18)
Troponin I (High Sensitivity): 520 ng/L (ref <18)

## 2019-07-04 LAB — MAGNESIUM: Magnesium: 2 mg/dL (ref 1.7–2.4)

## 2019-07-04 MED ORDER — ROMIPLOSTIM 250 MCG ~~LOC~~ SOLR
400.0000 ug | Freq: Once | SUBCUTANEOUS | Status: AC
  Administered 2019-07-04: 400 ug via SUBCUTANEOUS
  Filled 2019-07-04: qty 0.8

## 2019-07-04 MED ORDER — ONDANSETRON HCL 4 MG/2ML IJ SOLN
4.0000 mg | Freq: Once | INTRAMUSCULAR | Status: AC
  Administered 2019-07-04: 4 mg via INTRAVENOUS
  Filled 2019-07-04: qty 2

## 2019-07-04 MED ORDER — FUROSEMIDE 10 MG/ML IJ SOLN
40.0000 mg | Freq: Once | INTRAMUSCULAR | Status: AC
  Administered 2019-07-04: 40 mg via INTRAVENOUS
  Filled 2019-07-04: qty 4

## 2019-07-04 NOTE — Telephone Encounter (Signed)
Patient is currently admitted with Acute respiratory failure with hypoxia .

## 2019-07-04 NOTE — ED Notes (Signed)
Pt denies pain at this time.  Talkative with daughter in the room. Requests to drink and reviewed NPO status and not removing bipap mask repeatedly.  Pt appears to understand.  NAD, appears restful.

## 2019-07-04 NOTE — ED Notes (Signed)
Patient CBG was 235.

## 2019-07-04 NOTE — Telephone Encounter (Signed)
Per my notes, she is on: - Lantus 40 units 2x a day: before b'fast and at bedtime - Trulicity 1.5 mg weekly - Humalog  35 units before breakfast 30 units before lunch 25 units before dinner If sugars >90, give the full dose for a regular meal If sugars 60-90, give  17 units before breakfast 15 units before lunch 12 units before dinner If sugars <60, hold Humalog with the meal  If the sugars are still low at discharge from the hospital, she may need: - Lantus 30 units 2x a day - Humalog 25-20-15 units before breakfast-lunch-dinner And call us in few days if sugars are still low

## 2019-07-04 NOTE — ED Notes (Signed)
Pt having nausea and flushing sensation.  MD contacted for orders.

## 2019-07-04 NOTE — Progress Notes (Signed)
Subjective:   [name] is currently on bipap during our bedside visit, so she had her daughter help with background information and answered yes/no questions. She endorses continued SOB but denies any pain. Her daughter notes that [name] called her yesterday and said she had sudden difficult breathing.   Objective:  Vital signs in last 24 hours: Vitals:   07/04/19 0500 07/04/19 0600 07/04/19 0700 07/04/19 0800  BP: (!) 137/59 137/62 (!) 147/54 (!) 148/60  Pulse:  61 (!) 59 63  Resp: 17 (!) 30 (!) 26 20  Temp:      TempSrc:      SpO2:  100% 95% 94%  Weight:      Height:       Physical Exam Vitals signs and nursing note reviewed.  Constitutional:      General: She is not in acute distress.    Appearance: She is obese.  Neck:     Vascular: No JVD.  Pulmonary:     Effort: Tachypnea and accessory muscle usage present. No respiratory distress.     Breath sounds: Examination of the right-middle field reveals decreased breath sounds. Examination of the left-middle field reveals decreased breath sounds. Examination of the right-lower field reveals decreased breath sounds. Examination of the left-lower field reveals decreased breath sounds. Decreased breath sounds present. No wheezing, rhonchi or rales.  Musculoskeletal:     Right lower leg: Edema (1+ pitting) present.     Left lower leg: Edema (1+ pitting) present.  Neurological:     Mental Status: She is alert and oriented to person, place, and time.  Psychiatric:        Mood and Affect: Mood is anxious.        Behavior: Behavior normal.    Assessment/Plan:  Active Problems:   Acute hypoxemic respiratory failure [facility])  [name] is a 84 y/o female with PMHx of HTN, CKD IV, CHF, OSA and A.fib presenting with dyspnea and increasing oxygen requirement for one week duration that has acutely worsened on 10/26 in the afternoon. CXR concerning for pulmonary edema vs bilateral pneumonia. Initial ABG with respiratory  acidosis. Labs with neutrophil predominant leukocytosis. Concerns for CHF exacerbation vs pneumonia.   # Acute hypoxic respiratory failure Patient presented with sudden onset dyspnea in setting of increased O2 requirement for 1 week after cardioversion. At her ALF, she was on 2L oxygen without respiratory distress. Patient has a history of HFpEF (Echo 01/2017 with EF 65-70%). She is on an alternating schedule of 40mg  and 20mg  of Lasix daily and reports compliance. Initial examination consistent with CHF exacerbation (significant JVD, diffuse pulmonary crackles, and bilateral pitting edema). CXR with pulmonary edema vs bilateral pneumonia.   After diuresis with 40 mg IV Lasix, JVD and pitting edema improved, but patient still requiring Bipap. Nurse reports approximately 1.1L output since arrival. However, given continued BiPAP need in addition to persistent leukocytosis with neutrophil predominance, there is still concern for underlying pneumonia, as supported by initial chest xray. Will obtain a second xray today to see if any changes since diuresis. Will obtain repeat echo to evaluate current EF and if acutely reduced EF may be contributing. Will also check repeat ABG.   - Continue BiPAP and wean as tolerated - IV Lasix 40mg  qd - Continue Vancomycin and Cefepime  - Duoneb 71ml q4h  - Strict I/O's and daily weights - Repeat CXR pending - TTE - Repeat ABG pending   # QTc Prolongation:  Patient with recent cardioversion for  atypical atrial flutter. Her initial EKG on presentation with QTc 496 and repeat EKG with QTc 513. At this time, continue home Diltiazem and avoid QTc prolonging agents as much as possible.   - Cardiac monitoring - Diltiazem 180mg  qd - Avoid QT prolonging agents   # Troponinemia:  Likely secondary to demand ischemia in setting of hypertensive urgency and respiratory failure on presentation. Patient continues to be hemodynamically stable. No ischemic changes noted on EKG.    - Continue to monitor   # Hypertension:  Patient with history of hypertension and initially presented with SBP >200. She was placed on nitro gtt in the ED and BP responded well. Since then, her BP remains in the average 790-383F systolic. Will continue with current medication regimen and monitor closely.   - Diltiazem 180mg  qd  # Thrombocytopenia # Chronic ITP Platelets were 64 on admission and repeat platelets were 46, then 54. Patient has a history of chronic ITP for which she receives weekly N-plate infusions and last one was on 10/23.  Eliquis since August 2020 and reports continued use since her cardioversion. Dr. Marin Olp, her primary oncologist, has ordered N-plate (Romiplostim) to be administered today.   - CBC tomorrow A.M.  - Holding Eliquis for now - N-plate 400 mcg once today  # Acute Anemia:  Initial hemoglobin on admission was 10.1 and downtrended to 8.1 over 12 hours. Repeat this afternoon was 8.6, so stable. Unknown cause at this time. Will monitor closely.   - CBC tomorrow A.M.   # CKD Stage IV:  Patient with Hx of CKD IV. BUN/Cr 39/2.6 with GFR 17. Stable.   - Avoid nephrotoxic agents - BMP in the A.M.   # Diabetes mellitus:  Patient with history of DMII with insulin requirement. Patient reports taking Lantus 44U in AM and 44U in PM as well as Humalin SSI.   - CBG monitoring - Lantus 40U + SSI     Dispo: Anticipated discharge pending medical improvement.    Dr. Jose Persia Internal Medicine PGY-1  Pager: [phone] 07/04/2019, 8:09 AM

## 2019-07-04 NOTE — ED Notes (Signed)
Pt having increased work of breathing while on BIPAP.  resp rate 26, will hold on all PO meds.  RT in room as well.

## 2019-07-04 NOTE — ED Notes (Signed)
Doctors in room at this time.

## 2019-07-04 NOTE — Progress Notes (Addendum)
HEMATOLOGY-ONCOLOGY PROGRESS NOTE  SUBJECTIVE: [name] presented to the emergency room with shortness of breath.  The patient had a recent admission for cardioversion on 06/26/2019.  Since her cardioversion, she has been on 2 L of oxygen at a skilled nursing facility.  She received Lasix with some relief.  Dyspnea has been progressive.  Chest x-ray in the emergency room showed diffuse bilateral airspace opacities which may reflect pulmonary edema versus multifocal pneumonia.  Initial CBC in the emergency room showed a white blood cell count 12.6, hemoglobin 10.1 and platelet count of 64,000.  Her prior CBC from 06/30/2019 demonstrated a normal white blood cell count, hemoglobin 9.3, and normal platelet count 155,000.  BNP on admission was elevated at 285.2.  The patient has been started on IV antibiotics including cefepime and vancomycin.  She is also receiving IV diuresis with Lasix.  As an outpatient, the patient has been receiving Nplate for her immune-based thrombocytopenia.  The patient was seen in the emergency room today.  Her daughter is at the bedside.  She is currently on a BiPAP and continues to be quite short of breath.  Repeat chest x-ray was just obtained prior to my arrival.  Results are pending.  The patient states that she has had the epistaxis prior to admission but this is now resolved.  Has some blood oozing from her left AC IV site, but now stopped.  The patient's daughter expresses that her breathing has been very poor since her cardioversion.  She has not had any fevers or chills.  Denies chest pain.  Denies nausea vomiting.  She has not noticed any hemoptysis, hematemesis, hematuria, melena, hematochezia.  REVIEW OF SYSTEMS:   A 14 point review of systems was negative except as noted in the HPI.  I have reviewed the past medical history, past surgical history, social history and family history with the patient and they are unchanged from previous note.   PHYSICAL  EXAMINATION:  Vitals:   07/04/19 1200 07/04/19 1300  BP: (!) 150/55 (!) 144/62  Pulse: 63 62  Resp: (!) 29 (!) 23  Temp:    SpO2: 96% 92%   Filed Weights   07/03/19 1722  Weight: 250 lb (113.4 kg)    Intake/Output from previous day: 10/26 0701 - 10/27 0700 In: 100 [IV Piggyback:100] Out: -   GENERAL:alert, appears short of breath SKIN: Scattered ecchymoses.  No petechiae noted. EYES: Sclera anicteric LUNGS: Tachypneic, decreased breath sounds HEART: regular rate & rhythm and no murmurs and 1+ bilateral lower extremity edema ABDOMEN:abdomen soft, non-tender and normal bowel sounds Musculoskeletal:no cyanosis of digits and no clubbing  NEURO: alert & oriented x 3 with fluent speech, no focal motor/sensory deficits  LABORATORY DATA:  I have reviewed the data as listed CMP Latest Ref Rng & Units 07/04/2019 07/03/2019 07/03/2019  Glucose 70 - 99 mg/dL 188(H) - 209(H)  BUN 8 - 23 mg/dL 42(H) - 39(H)  Creatinine 0.44 - 1.00 mg/dL 2.56(H) - 2.60(H)  Sodium 135 - 145 mmol/L 143 140 140  Potassium 3.5 - 5.1 mmol/L 4.0 3.8 3.8  Chloride 98 - 111 mmol/L 104 - 98  CO2 22 - 32 mmol/L 28 - -  Calcium 8.9 - 10.3 mg/dL 8.2(L) - -  Total Protein 6.5 - 8.1 g/dL - - -  Total Bilirubin 0.3 - 1.2 mg/dL - - -  Alkaline Phos 38 - 126 U/L - - -  AST 15 - 41 U/L - - -  ALT 0 - 44 U/L - - -  Lab Results  Component Value Date   WBC 11.8 (H) 07/04/2019   HGB 8.6 (L) 07/04/2019   HCT 28.0 (L) 07/04/2019   MCV 94.9 07/04/2019   PLT 54 (L) 07/04/2019   NEUTROABS 9.3 (H) 07/03/2019    Dg Chest Port 1 View  Result Date: 07/03/2019 CLINICAL DATA:  Shortness of breath EXAM: PORTABLE CHEST 1 VIEW COMPARISON:  09/15/2017 FINDINGS: Grossly stable cardiomediastinal contours. Calcific aortic knob. There is pulmonary vascular congestion with diffuse airspace opacities bilaterally. No large pleural fluid collection. Patient positioning partially obscures the lung apices. No pneumothorax identified  within these limitations. IMPRESSION: Diffuse bilateral airspace opacities which may reflect pulmonary alveolar edema versus multifocal pneumonia. Electronically Signed   By: Davina Poke M.D.   On: 07/03/2019 17:33    ASSESSMENT AND PLAN: 1.  ITP 2.  Normocytic anemia 3.  Acute hypoxic respiratory failure secondary to CHF exacerbation versus pneumonia 4.  Stage IV CKD 5.  Diabetes mellitus  -Platelet count has been dropping likely due to ITP in the setting of acute infection.  Also receiving cefepime which could exacerbate her thrombocytopenia. The patient has been restarted on Nplate.  Monitor CBC daily.  Recommend platelet transfusion for active bleeding. -Agree with holding Eliquis for now.  Risk of bleeding outweighs benefits. -Hemoglobin is drifting down.  Anemia likely multifactorial.  She has required IV iron in the past.  Last ferritin was normal on 05/25/2019.  We will repeat iron studies and reticulocytes in the morning.  Transfuse for hemoglobin less than 8. -Continue management of acute hypoxic respiratory failure per primary team. -Renal function appears consistent with her baseline.  Monitor closely.  Avoid nephrotoxic medications. -Management of diabetes per primary team.    LOS: 1 day   Mikey Bussing, DNP, AGPCNP-BC, AOCNP 07/04/19   ADDENDUM: I saw and examined [name] this morning.  She is well-known to me.  She has ITP.  She has been on Nplate.  She has responded incredibly well to Nplate.  Her platelet count was 155,000 on 06/30/2019.  Now, it is down to about 50,000.  I have to believe that she has some form of infection.  She will had cardioversion on October 19.  By her exam, she is in sinus rhythm.  She does have the chronic renal insufficiency.  She is iron deficient.  I will give her some IV iron.  She got I think a dose of Nplate yesterday.  Her chest CT scan looks horrible.  I am surprised that she is doing as well as she is doing.  She has this  bilateral interstitial process in her lungs.  Hopefully this is edema and diuresis will help with this.  I probably would add another antibiotic to the protocol.  I think that we probably could have her on low-dose Eliquis.  I do not think her platelet count is that low that we need to stop the anticoagulation.  I know this is quite complicated.  She does have a lot of other health issues.  I just have a bad feeling that given her interstitial lung disease on CT scan, that this might take a while to get better and that she certainly may not survive this if things worsen.  I know she will get fantastic care from all the staff that is taking care of her.  She has a very strong faith.  [name], MD  Exodus 15:2

## 2019-07-04 NOTE — ED Notes (Signed)
Hematology in with patient.  Daughter concerned about whether Cardiology has been called.  Hematology to reach out to attending.

## 2019-07-05 ENCOUNTER — Other Ambulatory Visit: Payer: Self-pay

## 2019-07-05 ENCOUNTER — Inpatient Hospital Stay (HOSPITAL_COMMUNITY): Payer: Medicare Other

## 2019-07-05 ENCOUNTER — Encounter (HOSPITAL_COMMUNITY): Payer: Self-pay | Admitting: Cardiology

## 2019-07-05 DIAGNOSIS — N184 Chronic kidney disease, stage 4 (severe): Secondary | ICD-10-CM

## 2019-07-05 DIAGNOSIS — N390 Urinary tract infection, site not specified: Secondary | ICD-10-CM

## 2019-07-05 DIAGNOSIS — D509 Iron deficiency anemia, unspecified: Secondary | ICD-10-CM

## 2019-07-05 DIAGNOSIS — J9601 Acute respiratory failure with hypoxia: Secondary | ICD-10-CM

## 2019-07-05 DIAGNOSIS — R0602 Shortness of breath: Secondary | ICD-10-CM | POA: Diagnosis not present

## 2019-07-05 DIAGNOSIS — J810 Acute pulmonary edema: Secondary | ICD-10-CM

## 2019-07-05 DIAGNOSIS — E1122 Type 2 diabetes mellitus with diabetic chronic kidney disease: Secondary | ICD-10-CM

## 2019-07-05 DIAGNOSIS — I2510 Atherosclerotic heart disease of native coronary artery without angina pectoris: Secondary | ICD-10-CM

## 2019-07-05 DIAGNOSIS — J189 Pneumonia, unspecified organism: Secondary | ICD-10-CM | POA: Diagnosis not present

## 2019-07-05 DIAGNOSIS — I361 Nonrheumatic tricuspid (valve) insufficiency: Secondary | ICD-10-CM

## 2019-07-05 DIAGNOSIS — D693 Immune thrombocytopenic purpura: Secondary | ICD-10-CM

## 2019-07-05 DIAGNOSIS — B961 Klebsiella pneumoniae [K. pneumoniae] as the cause of diseases classified elsewhere: Secondary | ICD-10-CM

## 2019-07-05 DIAGNOSIS — Z9981 Dependence on supplemental oxygen: Secondary | ICD-10-CM

## 2019-07-05 LAB — CBG MONITORING, ED
Glucose-Capillary: 203 mg/dL — ABNORMAL HIGH (ref 70–99)
Glucose-Capillary: 213 mg/dL — ABNORMAL HIGH (ref 70–99)
Glucose-Capillary: 209 mg/dL — ABNORMAL HIGH (ref 70–99)
Glucose-Capillary: 202 mg/dL — ABNORMAL HIGH (ref 70–99)
Glucose-Capillary: 216 mg/dL — ABNORMAL HIGH (ref 70–99)

## 2019-07-05 LAB — RETICULOCYTES
Immature Retic Fract: 24.4 % — ABNORMAL HIGH (ref 2.3–15.9)
RBC.: 2.86 MIL/uL — ABNORMAL LOW (ref 3.87–5.11)
Retic Count, Absolute: 81.2 10*3/uL (ref 19.0–186.0)
Retic Ct Pct: 2.8 % (ref 0.4–3.1)

## 2019-07-05 LAB — URINE CULTURE: Culture: >=100000 — AB

## 2019-07-05 LAB — CBC WITH DIFFERENTIAL/PLATELET
Abs Immature Granulocytes: 0.07 10*3/uL (ref 0.00–0.07)
Basophils Absolute: 0 10*3/uL (ref 0.0–0.1)
Basophils Relative: 0 %
Eosinophils Absolute: 0.1 10*3/uL (ref 0.0–0.5)
Eosinophils Relative: 1 %
HCT: 27.1 % — ABNORMAL LOW (ref 36.0–46.0)
Hemoglobin: 8.4 g/dL — ABNORMAL LOW (ref 12.0–15.0)
Immature Granulocytes: 1 %
Lymphocytes Relative: 8 %
Lymphs Abs: 0.7 10*3/uL (ref 0.7–4.0)
MCH: 29.4 pg (ref 26.0–34.0)
MCHC: 31 g/dL (ref 30.0–36.0)
MCV: 94.8 fL (ref 80.0–100.0)
Monocytes Absolute: 0.8 10*3/uL (ref 0.1–1.0)
Monocytes Relative: 9 %
Neutro Abs: 7.3 10*3/uL (ref 1.7–7.7)
Neutrophils Relative %: 81 %
Platelets: 46 10*3/uL — ABNORMAL LOW (ref 150–400)
RBC: 2.86 MIL/uL — ABNORMAL LOW (ref 3.87–5.11)
RDW: 18.4 % — ABNORMAL HIGH (ref 11.5–15.5)
WBC: 8.9 10*3/uL (ref 4.0–10.5)
nRBC: 0 % (ref 0.0–0.2)

## 2019-07-05 LAB — ECHOCARDIOGRAM COMPLETE
Height: 61 in
Weight: 4000 oz

## 2019-07-05 LAB — BASIC METABOLIC PANEL
Anion gap: 12 (ref 5–15)
BUN: 53 mg/dL — ABNORMAL HIGH (ref 8–23)
CO2: 30 mmol/L (ref 22–32)
Calcium: 8.9 mg/dL (ref 8.9–10.3)
Chloride: 101 mmol/L (ref 98–111)
Creatinine, Ser: 2.68 mg/dL — ABNORMAL HIGH (ref 0.44–1.00)
GFR calc Af Amer: 19 mL/min — ABNORMAL LOW (ref >60)
GFR calc non Af Amer: 16 mL/min — ABNORMAL LOW (ref >60)
Glucose, Bld: 222 mg/dL — ABNORMAL HIGH (ref 70–99)
Potassium: 4.3 mmol/L (ref 3.5–5.1)
Sodium: 143 mmol/L (ref 135–145)

## 2019-07-05 LAB — GLUCOSE, CAPILLARY: Glucose-Capillary: 271 mg/dL — ABNORMAL HIGH (ref 70–99)

## 2019-07-05 LAB — IRON AND TIBC
Iron: 22 ug/dL — ABNORMAL LOW (ref 28–170)
Saturation Ratios: 7 % — ABNORMAL LOW (ref 10.4–31.8)
TIBC: 337 ug/dL (ref 250–450)
UIBC: 315 ug/dL

## 2019-07-05 LAB — TROPONIN I (HIGH SENSITIVITY): Troponin I (High Sensitivity): 232 ng/L (ref <18)

## 2019-07-05 LAB — PROCALCITONIN: Procalcitonin: 4.77 ng/mL

## 2019-07-05 LAB — FERRITIN: Ferritin: 138 ng/mL (ref 11–307)

## 2019-07-05 MED ORDER — DARBEPOETIN ALFA 300 MCG/0.6ML IJ SOSY
300.0000 ug | PREFILLED_SYRINGE | Freq: Once | INTRAMUSCULAR | Status: AC
  Administered 2019-07-05: 300 ug via SUBCUTANEOUS
  Filled 2019-07-05 (×2): qty 0.6

## 2019-07-05 MED ORDER — SODIUM CHLORIDE 0.9 % IV SOLN
100.0000 mg | Freq: Two times a day (BID) | INTRAVENOUS | Status: DC
  Administered 2019-07-05 – 2019-07-08 (×6): 100 mg via INTRAVENOUS
  Filled 2019-07-05 (×8): qty 100

## 2019-07-05 MED ORDER — POTASSIUM CHLORIDE CRYS ER 20 MEQ PO TBCR: 40.0000 meq | EXTENDED_RELEASE_TABLET | Freq: Once | ORAL | Status: DC

## 2019-07-05 MED ORDER — SODIUM CHLORIDE 0.9 % IV SOLN
500.0000 mg | INTRAVENOUS | Status: DC
  Administered 2019-07-05: 500 mg via INTRAVENOUS
  Filled 2019-07-05: qty 500

## 2019-07-05 MED ORDER — HYDROXYZINE HCL 50 MG/ML IM SOLN
25.0000 mg | Freq: Once | INTRAMUSCULAR | Status: AC
  Administered 2019-07-05: 25 mg via INTRAMUSCULAR
  Filled 2019-07-05: qty 0.5

## 2019-07-05 MED ORDER — ALBUMIN HUMAN 25 % IV SOLN
12.5000 g | Freq: Once | INTRAVENOUS | Status: AC
  Administered 2019-07-05: 12.5 g via INTRAVENOUS
  Filled 2019-07-05: qty 50

## 2019-07-05 MED ORDER — APIXABAN 2.5 MG PO TABS
2.5000 mg | ORAL_TABLET | Freq: Two times a day (BID) | ORAL | Status: DC
  Administered 2019-07-05 – 2019-07-06 (×3): 2.5 mg via ORAL
  Filled 2019-07-05 (×5): qty 1

## 2019-07-05 MED ORDER — METHYLPREDNISOLONE SODIUM SUCC 40 MG IJ SOLR
40.0000 mg | Freq: Two times a day (BID) | INTRAMUSCULAR | Status: DC
  Administered 2019-07-05 – 2019-07-08 (×6): 40 mg via INTRAVENOUS
  Filled 2019-07-05 (×7): qty 1

## 2019-07-05 MED ORDER — SODIUM CHLORIDE 0.9 % IV SOLN
510.0000 mg | Freq: Once | INTRAVENOUS | Status: DC
  Filled 2019-07-05: qty 17

## 2019-07-05 MED ORDER — FUROSEMIDE 10 MG/ML IJ SOLN
40.0000 mg | Freq: Four times a day (QID) | INTRAMUSCULAR | Status: DC
  Administered 2019-07-05 – 2019-07-06 (×4): 40 mg via INTRAVENOUS
  Filled 2019-07-05 (×4): qty 4

## 2019-07-05 NOTE — Consult Note (Signed)
NAME:  [name], MRN:  [id number], DOB:  1935-06-14, LOS: 2 ADMISSION DATE:  07/03/2019, CONSULTATION DATE:  10/28 REFERRING MD:  Dr. Charleen Kirks FPTS, CHIEF COMPLAINT:  Prolonged respiratory failure   Brief History   84yo F with extensive past medical history presented SOB with increased oxygen demands, requiring initiation of BIPAP therapy. Patient has been unable to wean from NIV therapy over the last 36hrs, PCCM consulted for further management.   History of present illness   [name] is an 84yo female with extensive past medical history including OSA, chronic hypoxic  respiratory failure, CKD, and diastolic congestive heart failure who presented for local ALF with progressive shortness of breath requiring initiation of BIPAP therapy and has been unable to wean NIV since arrival 36hrs ago.   Patient underwent successful cardioversion 10/19 and was unable to wean from supplemental oxygen at time of discharge. She was discharged back to ALF with supplemental oxygen and has had progressive need for higher levels of oxygen since discharge. Per daughter patient was diagnosed with viral pneumonia at ALF but when dyspnea worsened patient was brought to ED. Daughter reported increased lower extremity edema prior to admission. Denied any fever, chills, weight changes, chest pain, cough, nausea/vomiting, or urinary symptoms.   On arrival patient was seen with increased work of breathing, diaphoretic, with shortness of breath requiring application of BiPAP. CT chest with widespread ground-glass throughout both lungs, small bilateral effusions, cardiomegaly with enlargement of main pulmonary artery. Also noted progressive incline of troponin since arrival.   PCCM consulted for further management.  Past Medical History  Hypertension Type 2 diabetes Syncope Chronic hypoxic respiratory failure OSA on CPAP Obesity  Asthma  Depression Anxiety  Significant Hospital Events   10/26 Admitted   Consults:  PCCM  Procedures:    Significant Diagnostic Tests:  CT Chest 10/27 >> widespread ground-glass throughout both lungs, small bilateral effusions, cardiomegaly with enlargement of main pulmonary artery.  Micro Data:  COVID 10/26 >> neg Urine culture 10/26 >> Klebsilla pna, resistant to ampicillin Blood culture   Antimicrobials:  10/26 Cefepime >> 10/26 Vancomycin >> Doxycycline 10/28 >>  Interim history/subjective:  RN reports no acute events, she is seen lying in bed on BIPAP with no increased work of breathing. Daughter is at beside and updated regarding plan.   Objective   Blood pressure (!) 147/54, pulse 67, temperature 98.3 F (36.8 C), temperature source Oral, resp. rate (!) 21, height 5\' 1"  (1.549 m), weight 113.4 kg, SpO2 95 %.    FiO2 (%):  [50 %-60 %] 55 %   Intake/Output Summary (Last 24 hours) at 07/05/2019 1414 Last data filed at 07/04/2019 2210 Gross per 24 hour  Intake -  Output 1000 ml  Net -1000 ml   Filed Weights   07/03/19 1722  Weight: 113.4 kg    Examination: General: Chronically ill appearing elderly obese female, in NAD  HEENT: BIPAP in place MM pink/dry, PERRL,  Neuro: Alert and oriented, able to follow commands and answer all questions, no focal deficits  CV: s1s2 regular rate and rhythm, no murmur, rubs, or gallops,  PULM:  Diminished breath sounds bilaterally with no added sounds or increased work of breathing GI: soft, bowel sounds active in all 4 quadrants, non-tender, non-distended Extremities: warm/dry, generalized edema  Skin: no rashes or lesions  Resolved Hospital Problem list     Assessment & Plan:   Acute on chronic hypoxic and hypercapnic respiratory failure History of OSA compliant with CPAP -Presented with  progressive decline and increased oxygen demand week prior to admission, initially thought to be viral bronchitis P: Continue supplemental oxygen, attempt to transition to heated high flow if patient will  tolerate  Begin IV steroids  Continue broad spectrum antibiotics with low threshold to discontinue  Aggressively diurese as renal function will allow  IV Albumin  Continue bronchodilators  Strict intake and output Repeat ECHO  Family currently discussion intubation for goals of care   Elevated troponin  Chronic diastolic congestive heart failure History of A-fib  -Presented with a HS troponin of 14 with progressive rise with peak of 520 10/27. Per primary Initially felt related to demand ischemia given respiratory distress. It appears she has remained in sinus rhythm with no sign of tachycardia, hypertensin / hypertensive to produce demand ischemia  P: Given steady incline of troponin with extensive PMHx and no current severe increased work of breath patient would benefit from cardiology consult to rule out NSTEMI  ECHO as above  Currently in sinus rhythm Primary holding eliquis    Rest per primary team   Best practice:  Diet: NPO while on BIPAP Pain/Anxiety/Delirium protocol (if indicated): N/A VAP protocol (if indicated): N/A DVT prophylaxis: SCD GI prophylaxis: PPI Glucose control: SSI Mobility: Per Primary  Code Status: LCM, intubation only  Family Communication: Daughter updated at bedside  Disposition: Per Primary   Labs   CBC: Recent Labs  Lab 06/30/19 1400  07/03/19 1720 07/03/19 1735 07/04/19 0422 07/04/19 1226 07/04/19 1536 07/05/19 0224  WBC 6.4  --  12.6*  --  10.8* 11.8*  --  8.9  NEUTROABS 5.2  --  9.3*  --   --   --   --  7.3  HGB 9.3*   < > 10.1* 11.2*  10.9* 8.1* 8.6* 8.2* 8.4*  HCT 29.9*  --  32.8* 33.0*  32.0* 27.0* 28.0* 24.0* 27.1*  MCV 94.3  --  94.0  --  97.1 94.9  --  94.8  PLT 155  --  64*  --  46* 54*  --  46*   < > = values in this interval not displayed.    Basic Metabolic Panel: Recent Labs  Lab 07/03/19 1720 07/03/19 1735 07/03/19 2219 07/04/19 0422 07/04/19 1536 07/05/19 0224  NA 142 140  140  --  143 140 143  K 3.8 3.8   3.8  --  4.0 4.4 4.3  CL 99 98  --  104  --  101  CO2 30  --   --  28  --  30  GLUCOSE 207* 209*  --  188*  --  222*  BUN 39* 39*  --  42*  --  53*  CREATININE 2.59* 2.60*  --  2.56*  --  2.68*  CALCIUM 9.3  --   --  8.2*  --  8.9  MG  --   --  2.1 2.0  --   --    GFR: Estimated Creatinine Clearance: 19.2 mL/min (A) (by C-G formula based on SCr of 2.68 mg/dL (H)). Recent Labs  Lab 07/03/19 1720 07/03/19 2020 07/04/19 0422 07/04/19 1226 07/04/19 1521 07/05/19 0224  PROCALCITON  --   --   --   --  4.65 4.77  WBC 12.6*  --  10.8* 11.8*  --  8.9  LATICACIDVEN 1.9 1.3  --   --   --   --     Liver Function Tests: Recent Labs  Lab 07/03/19 1720  AST 33  ALT  30  ALKPHOS 67  BILITOT 0.8  PROT 8.1  ALBUMIN 3.8   No results for input(s): LIPASE, AMYLASE in the last 168 hours. No results for input(s): AMMONIA in the last 168 hours.  ABG    Component Value Date/Time   PHART 7.433 07/04/2019 1536   PCO2ART 50.4 (H) 07/04/2019 1536   PO2ART 75.0 (L) 07/04/2019 1536   HCO3 33.7 (H) 07/04/2019 1536   TCO2 35 (H) 07/04/2019 1536   O2SAT 95.0 07/04/2019 1536     Coagulation Profile: Recent Labs  Lab 07/03/19 1831  INR 1.4*    Cardiac Enzymes: No results for input(s): CKTOTAL, CKMB, CKMBINDEX, TROPONINI in the last 168 hours.  HbA1C: Hemoglobin A1C  Date/Time Value Ref Range Status  02/22/2019 11:20 AM 6.3 (A) 4.0 - 5.6 % Final  09/08/2018 10:06 AM 7.3 (A) 4.0 - 5.6 % Final   Hemoglobin A1c  Date/Time Value Ref Range Status  08/19/2017 02:00 PM 7.1 (H) 4.8 - 5.6 % Final    Comment:             Prediabetes: 5.7 - 6.4          Diabetes: >6.4          Glycemic control for adults with diabetes: <7.0    Hgb A1c MFr Bld  Date/Time Value Ref Range Status  12/15/2016 08:53 AM 7.0 (H) 4.6 - 6.5 % Final    Comment:    Glycemic Control Guidelines for People with Diabetes:Non Diabetic:  <6%Goal of Therapy: <7%Additional Action Suggested:  >8%   09/11/2015 10:14 AM 8.3  (H) 4.6 - 6.5 % Final    Comment:    Glycemic Control Guidelines for People with Diabetes:Non Diabetic:  <6%Goal of Therapy: <7%Additional Action Suggested:  >8%     CBG: Recent Labs  Lab 07/04/19 0802 07/04/19 1353 07/04/19 1728 07/04/19 2157 07/05/19 0839  GLUCAP 235* 223* 230* 204* 203*    Review of Systems: Positive in bold   Gen: Denies fever, chills, weight change, fatigue, night sweats HEENT: Denies blurred vision, double vision, hearing loss, tinnitus, sinus congestion, rhinorrhea, sore throat, neck stiffness, dysphagia PULM: Denies shortness of breath, cough, sputum production, hemoptysis, wheezing CV: Denies chest pain, lower extremity edema, orthopnea, paroxysmal nocturnal dyspnea, palpitations GI: Denies abdominal pain, nausea, vomiting, diarrhea, hematochezia, melena, constipation, change in bowel habits GU: Denies dysuria, hematuria, polyuria, oliguria, urethral discharge Endocrine: Denies hot or cold intolerance, polyuria, polyphagia or appetite change Derm: Denies rash, dry skin, scaling or peeling skin change Heme: Denies easy bruising, bleeding, bleeding gums Neuro: Denies headache, numbness, weakness, slurred speech, loss of memory or consciousness   Past Medical History  She,  has a past medical history of Allergic rhinitis, Anxiety state, unspecified, CHF (congestive heart failure) ([place]), Depressive disorder, not elsewhere classified, Extrinsic asthma, unspecified, Obesity, OSA on CPAP, Pure hypercholesterolemia, Respiratory failure with hypoxia ([place]) (09/2008), Scoliosis, Syncope and collapse, Type II or unspecified type diabetes mellitus without mention of complication, not stated as uncontrolled, Unspecified essential hypertension, and Unspecified hypothyroidism.    Surgical History    Past Surgical History:  Procedure Laterality Date  . CARDIOVERSION N/A 06/26/2019   Procedure: CARDIOVERSION;  Surgeon: Jerline Pain, MD;  Location: [facility] ENDOSCOPY;   Service: Cardiovascular;  Laterality: N/A;  . EYE SURGERY    . JOINT REPLACEMENT    . knee replaced    . THYROID SURGERY       Social History   reports that she quit smoking about 29 years ago.  Her smoking use included cigarettes. She has a 5.00 pack-year smoking history. She has never used smokeless tobacco. She reports that she does not drink alcohol or use drugs.    Family History   Her family history includes Allergies in her daughter and mother; Arthritis in an other family member; Asthma in her mother; Clotting disorder in her daughter, maternal grandmother, maternal uncle, and mother; Coronary artery disease in an other family member; Diabetes in an other family member; Hyperlipidemia in an other family member; Hypertension in an other family member; Osteoarthritis in her daughter and mother; Rheum arthritis in her maternal grandmother; Stroke in an other family member. There is no history of Breast cancer.    Allergies Allergies  Allergen Reactions  . Adhesive [Tape] Other (See Comments)    PULLS OFF THE SKIN  . Apixaban Other (See Comments)    Internal Bleeding  . Aspirin Itching, Rash, Hives and Swelling    Swelling of her tongue  . Mirabegron Other (See Comments)    Patient experienced A-Fib  . Pineapple Anaphylaxis and Swelling    Throat swells and blisters on tongue and roof of mouth per patient  . Metformin Diarrhea and Nausea Only  . Tetracycline Hives  . Fluticasone-Salmeterol Itching and Rash  . Iodinated Diagnostic Agents Itching and Rash       . Lactose Intolerance (Gi) Other (See Comments)    Gas   . Latex Itching, Rash and Other (See Comments)    Pulls off the skin and causes welts  . Lisinopril Cough  . Metronidazole Other (See Comments)    Reaction not known  . Sulfa Antibiotics Rash  . Sulfonamide Derivatives Itching and Rash     Home Medications  Prior to Admission medications   Medication Sig Start Date End Date Taking? Authorizing Provider   acetaminophen (TYLENOL) 325 MG tablet Take 650 mg by mouth every 6 (six) hours as needed for moderate pain or fever (for fever or pain/discomfort).    Yes [provider]  amoxicillin (AMOXIL) 500 MG capsule Take 1,000-2,000 mg by mouth See admin instructions. Take 1,000 mg by mouth one hour before dental appointments and 1,000 mg afterwards AS DIRECTED   Yes [provider]  apixaban (ELIQUIS) 2.5 MG TABS tablet Take 1 tablet (2.5 mg total) by mouth 2 (two) times daily. 04/18/19  Yes Jerline Pain, MD  atorvastatin (LIPITOR) 40 MG tablet Take 40 mg by mouth at bedtime.    Yes [provider]  bisacodyl (DULCOLAX) 5 MG EC tablet Take 10 mg by mouth every 3 (three) days as needed (for constipation).    Yes [provider]  Cholecalciferol (VITAMIN D3) 2000 units TABS Take 2,000 Units by mouth daily.   Yes [provider]  Continuous Blood Gluc Sensor (FREESTYLE LIBRE 14 DAY SENSOR) MISC Inject 1 patch into the skin every 14 (fourteen) days.   Yes [provider]  diltiazem (DILACOR XR) 180 MG 24 hr capsule Take 180 mg by mouth every evening.    Yes [provider]  diphenhydrAMINE (BANOPHEN) 25 MG tablet Take 25 mg by mouth as needed ("for adverse food reactions").    Yes [provider]  docusate sodium (COLACE) 100 MG capsule Take 100 mg by mouth daily.  04/30/15  Yes [provider]  Dulaglutide (TRULICITY) 1.5 VZ/8.5YI SOPN Inject 1.5 mg into the skin once a week. Patient taking differently: Inject 1.5 mg into the skin every Sunday.  09/09/18  Yes Philemon Kingdom, MD  fenofibrate 54 MG tablet TAKE 1 TABLET(54 MG) BY MOUTH DAILY Patient taking differently: Take 54 mg by mouth daily.  08/04/17  Yes Nafziger, Tommi Rumps, NP  fexofenadine (ALLEGRA) 180 MG tablet Take 180 mg by mouth at bedtime.    Yes [provider]  Flaxseed, Linseed, (FLAXSEED OIL) 1000 MG CAPS Take 1,000 mg by mouth 2 (two) times daily.    Yes  [provider]  furosemide (LASIX) 20 MG tablet Take 20 mg by mouth See admin instructions. Take 20 mg by mouth in the morning on Sun/Tues/Thurs/Sat   Yes [provider]  furosemide (LASIX) 40 MG tablet Take 40 mg by mouth See admin instructions. Take 40 mg by mouth in the morning on Mon/Wed/Fri 02/10/19  Yes [provider]  HUMALOG 100 UNIT/ML injection Inject 25-35 Units into the skin See admin instructions. "Inject 35 units into the skin before breakfast, 30 units before lunch, and 25 units before supper, per sliding scale: BGL >90 = give the full dose for a regular meal, 60-90 = 17 units before breakfast, 15 units before lunch, and 12 units before supper; hold if <60" 02/10/19  Yes [provider]  insulin glargine (LANTUS) 100 UNIT/ML injection Inject 0.4 mLs (40 Units total) into the skin daily. Patient taking differently: Inject 40 Units into the skin 2 (two) times daily.  02/22/19  Yes Philemon Kingdom, MD  levothyroxine (SYNTHROID, LEVOTHROID) 112 MCG tablet TAKE 1 TABLET BY MOUTH DAILY Patient taking differently: Take 112 mcg by mouth daily before breakfast.  11/24/17  Yes Nafziger, Tommi Rumps, NP  Multiple Vitamin (MULTIVITAMIN) tablet Take 1 tablet by mouth daily.     Yes [provider]  omeprazole (PRILOSEC) 20 MG capsule Take 20 mg by mouth daily.  01/25/19  Yes [provider]  ondansetron (ZOFRAN) 4 MG tablet Take 4 mg by mouth every 8 (eight) hours as needed for nausea or vomiting.   Yes [provider]  oxymetazoline (AFRIN) 0.05 % nasal spray Place 2 sprays into both nostrils every 12 (twelve) hours as needed (for nose bleeds).   Yes [provider]  Phenylephrine-APAP-guaiFENesin (TYLENOL SINUS SEVERE) 5-325-200 MG TABS Take 2 tablets by mouth every 4 (four) hours as needed (sinus headaches).   Yes [provider]  polyethylene glycol (MIRALAX / GLYCOLAX) 17 g packet Take 17 g by mouth daily as needed (for  constipation).    Yes [provider]  potassium citrate (UROCIT-K) 10 MEQ (1080 MG) SR tablet Take 10 mEq by mouth 2 (two) times daily.   Yes [provider]  sertraline (ZOLOFT) 100 MG tablet Take 100 mg by mouth every evening.  05/24/18  Yes [provider]  vitamin C (ASCORBIC ACID) 250 MG tablet Take 250 mg by mouth daily.   Yes [provider]  Insulin Pen Needle (B-D UF III MINI PEN NEEDLES) 31G X 5 MM MISC Use daily for insulin injection 12/15/16   Nafziger, Tommi Rumps, NP  Insulin Syringe-Needle U-100 (INSULIN SYRINGE 1CC/30GX5/16") 30G X 5/16" 1 ML MISC 2 (two) times daily.  01/27/17   [provider]    Signature:    Johnsie Cancel, NP-C [facility] After hours pager: [phone]. 07/05/2019, 3:22 PM

## 2019-07-05 NOTE — ED Notes (Signed)
Checked patients CBG and it was 209. Notified RN

## 2019-07-05 NOTE — ED Notes (Signed)
Pt's CBG result was 202. Informed Gerald Stabs - RN.

## 2019-07-05 NOTE — ED Notes (Signed)
Dry cough only  Not productive

## 2019-07-05 NOTE — ED Notes (Signed)
Waiting for Lucianne Lei to come from pharmacy

## 2019-07-05 NOTE — Progress Notes (Signed)
Placed pt back on bipap due to increased wob and decreased sp02 in the mid 80's.

## 2019-07-05 NOTE — ED Notes (Signed)
Report attempted  rn to call me back

## 2019-07-05 NOTE — ED Notes (Signed)
pts sats staying up while on bi-pap

## 2019-07-05 NOTE — Progress Notes (Signed)
Internal Medicine Attending:   I saw and examined the patient. I reviewed the resident's note and I agree with the resident's findings and plan as documented in the resident's note.  Patient states that she feels a little better this morning but still is BiPAP dependent.  Patient was initially made to the hospital with acute hypoxic respiratory failure.  Etiology of her hypoxia remains uncertain at this time but is probably infectious in nature versus fluid overload secondary to diastolic heart failure.  Patient received 2 doses of IV Lasix 40 mg yesterday.  I's and O's were difficult to calculate given patient is in ED and these were not recorded appropriately but per resident discussion with RN patient was net negative approximately 1.5 L at 5 PM yesterday.  Despite diuresing patient's aspiratory status has not improved.  Patient also noted to have an elevated procalcitonin of 4.5.  I suspect that this may be secondary to an underlying atypical infection versus ARDS.  We will continue with cefepime, vancomycin and doxycycline for now.  PCCM follow-up and recommendations appreciated.  Patient is failed BiPAP wean twice today.  We will continue with BiPAP for now.  Patient be started on IV steroids per pulmonary.  Of note, patient also had rising troponin with last troponin of 520 on October 27.  We will follow-up repeat troponin from today.  I suspect that the rising troponin is secondary to demand ischemia in the setting of hypoxemic respiratory failure.  Patient denies any chest pain and has no acute ischemic changes on EKG.  We will follow-up 2D echo (ordered yesterday but was done today).  It is also possible that the patient has severe volume overload.  However, her physical exam does not bear this out.  She only has trace lower extremity edema and she has only mild JVD elevation.  We will continue to diurese her.  We will follow up cardiology evaluation.  No further work-up at this time.

## 2019-07-05 NOTE — Progress Notes (Signed)
Spoke with patient and daughter at bedside this afternoon. We went over the cardiac vs pulmonary code procedures and what each includes. [name] emphasizes that the most important thing to her is to not be in pain, but she would like to be intubated if needed. In terms of cardiac code, she does not agree to compressions or shock. She deferred the decision regarding ACLS medications to her daughters (one in person, other on the phone), and both agree they would like ACLS medications to be administered. For intubation, [name] is okay with medication required for intubation.   We discussed the current treatment plan includes both diuresing for possible pulmonary edema, as well as antibiotics for pneumonia. Patient's daughters were concerned that she has not begun to significantly improve yet. I explained that given her presentation, improvement takes time. Emphasized that this is a fluid situation and that condition can get better, but can also get worse. We discussed that PCCM and cardiology have been consulted.   All questions and concerns addressed.   During discussion, [name] was on heated HFNC at 15 L and satting between 92-93%. She noted increased SOB when not on Bipap. No increased accessory muscle use noted though. Emphasized the importance of staying on [place] while able. PCCM NP stopped by and did note she will be put back on Bipap this afternoon/night.

## 2019-07-05 NOTE — ED Notes (Signed)
Dr hocherin in to see the pt

## 2019-07-05 NOTE — Consult Note (Addendum)
Cardiology Consultation:   Patient ID: [name] MRN: [id number]; DOB: Jun 09, 1935  Admit date: 07/03/2019 Date of Consult: 07/05/2019  Primary Care Provider: Javier Glazier, MD Primary Cardiologist: Sara Furbish, MD  Primary Electrophysiologist:  None    Patient Profile:   [name] is a 84 y.o. female with a hx of PAF, HL, DM, HTN, hypothyroidism, CHF, ITP  who is being seen today for the evaluation of elevated troponin at the request of Dr. Dareen [name].  History of Present Illness:   [name] is a 84 yo female with PMH noted above.  She is followed by [name] as well as [name] in the A. fib clinic.  She is noted to have history of atrial fibrillation along with palpitations and has been on reduced dose Eliquis 2.5 mg twice daily for several years.  She also has idiopathic thrombocytopenia and is followed by [name] through the cancer center.  Last echocardiogram on 01/2017 showed EF of 65 to 70% with no wall motion abnormality, grade 2 diastolic dysfunction moderately dilated left atrium and PA pressure of 35 mmHg.   Notes indicate that back in September 2019 she was noted to be in SVT versus atrial flutter and was fairly unaware but felt fatigued.  Rate control was determined to be the goal at that time and her diltiazem was increased to 180 mg daily. She was last seen in the office on 01/17/2019 with [name].  At this last office visit she was noted to be rate controlled.  Noted not to be a candidate for an ablation.  Her platelet count was much improved to 340,000.  She was again seen in the A. fib clinic on 06/06/2019.  Stated she had a recent office visit with [name] who had discussed with her and both [name] trying a cardioversion.  She stated she had not missed any doses of her Eliquis, and no bleeding issues.  Platelet counts were noted at 169,000.  Cardioversion was discussed with both the patient and her daughter who was agreeable.  She  presented on 06/26/2019 with successful cardioversion to sinus rhythm heart rate 60.  She was brought to the ED on 10/26 for progressive dyspnea.  Patient noted worsening shortness of breath over the past week prior to admission.  Currently lives at [name] assisted living facility.  States she is very independent uses a walker to get around.  Was recently diagnosed with bronchitis about a week ago at the facility.  Has progressively gotten worse over the past couple days.  Also reports worsening abdominal and lower extremity edema.  In the ED her labs showed stable electrolytes, creatinine 2.5, BNP 285, high-sensitivity troponin 14>> 132>> 316>> 422>> 520, WBC 12.6 platelets 64, pro calcitonin 4.7. COVID negative. Chest x-ray with diffuse bilateral opacities concerning for edema versus multifocal pneumonia.  CT chest showed widespread areas of groundglass breakthrough in both lungs concerning for ARDS.  She was initially placed on BiPAP in the ED. EKG on arrival appears to be rate controlled Afib.  She was given IV her lasix in the ED, with 1L UOP thus far along with broad-spectrum IV antibiotics.  Given her decline in her platelet count she was seen by [name] who recommended that she receive a dose of Nplate.  They felt comfortable continuing her on her low-dose Eliquis at this time.  Given that she has been unable to successfully wean from BiPAP and PCCM was consulted for further management.  Her Lasix has been increased to 40 mg IV every 6 hours, and IV steroids added.  At the time of assessment she is currently on high flow nasal cannula, but does appear to be quite dyspneic.  Of note she denies any chest [name] prior to admission.  Daughter does report she had a medically induced MI about 5 years ago whenever she was septic while living in [name] which was treated.  Heart Pathway Score:     Past Medical History:  Diagnosis Date   Allergic rhinitis    Anxiety state,  unspecified    panic attacks   CHF (congestive heart failure) (HCC)    Depressive disorder, not elsewhere classified    Extrinsic asthma, unspecified    no problem since adulthood   Obesity    OSA on CPAP    severe   Pure hypercholesterolemia    Respiratory failure with hypoxia ([place]) 09/2008   acute, secondary to multiple bilateral pulmonary embolism , negative hypercoagulable workup 09/2008 hospital stay   Scoliosis    Syncope and collapse    Type II or unspecified type diabetes mellitus without mention of complication, not stated as uncontrolled    Unspecified essential hypertension    Unspecified hypothyroidism    hypo    Past Surgical History:  Procedure Laterality Date   CARDIOVERSION N/A 06/26/2019   Procedure: CARDIOVERSION;  Surgeon: Sara Pain, MD;  Location: [facility];  Service: Cardiovascular;  Laterality: N/A;   EYE SURGERY     JOINT REPLACEMENT     knee replaced     THYROID SURGERY       Home Medications:  Prior to Admission medications   Medication Sig Start Date End Date Taking? Authorizing Provider  acetaminophen (TYLENOL) 325 MG tablet Take 650 mg by mouth every 6 (six) hours as needed for moderate [name] or fever (for fever or [name]/discomfort).    Yes [provider]  amoxicillin (AMOXIL) 500 MG capsule Take 1,000-2,000 mg by mouth See admin instructions. Take 1,000 mg by mouth one hour before dental appointments and 1,000 mg afterwards AS DIRECTED   Yes [provider]  apixaban (ELIQUIS) 2.5 MG TABS tablet Take 1 tablet (2.5 mg total) by mouth 2 (two) times daily. 04/18/19  Yes Sara Pain, MD  atorvastatin (LIPITOR) 40 MG tablet Take 40 mg by mouth at bedtime.    Yes [provider]  bisacodyl (DULCOLAX) 5 MG EC tablet Take 10 mg by mouth every 3 (three) days as needed (for constipation).    Yes [provider]  Cholecalciferol (VITAMIN D3) 2000 units TABS Take 2,000 Units by mouth daily.   Yes  [provider]  Continuous Blood Gluc Sensor (FREESTYLE LIBRE 14 DAY SENSOR) MISC Inject 1 patch into the skin every 14 (fourteen) days.   Yes [provider]  diltiazem (DILACOR XR) 180 MG 24 hr capsule Take 180 mg by mouth every evening.    Yes [provider]  diphenhydrAMINE (BANOPHEN) 25 MG tablet Take 25 mg by mouth as needed ("for adverse food reactions").    Yes [provider]  docusate sodium (COLACE) 100 MG capsule Take 100 mg by mouth daily.  04/30/15  Yes [provider]  Dulaglutide (TRULICITY) 1.5 GE/3.6OQ SOPN Inject 1.5 mg into the skin once a week. Patient taking differently: Inject 1.5 mg into the skin every Sunday.  09/09/18  Yes Philemon Kingdom, MD  fenofibrate 54 MG tablet TAKE 1 TABLET(54 MG) BY MOUTH DAILY Patient  taking differently: Take 54 mg by mouth daily.  08/04/17  Yes Nafziger, Tommi Rumps, [name]  fexofenadine (ALLEGRA) 180 MG tablet Take 180 mg by mouth at bedtime.    Yes [provider]  Flaxseed, Linseed, (FLAXSEED OIL) 1000 MG CAPS Take 1,000 mg by mouth 2 (two) times daily.    Yes [provider]  furosemide (LASIX) 20 MG tablet Take 20 mg by mouth See admin instructions. Take 20 mg by mouth in the morning on Sun/Tues/Thurs/Sat   Yes [provider]  furosemide (LASIX) 40 MG tablet Take 40 mg by mouth See admin instructions. Take 40 mg by mouth in the morning on Mon/Wed/Fri 02/10/19  Yes [provider]  HUMALOG 100 UNIT/ML injection Inject 25-35 Units into the skin See admin instructions. "Inject 35 units into the skin before breakfast, 30 units before lunch, and 25 units before supper, per sliding scale: BGL >90 = give the full dose for a regular meal, 60-90 = 17 units before breakfast, 15 units before lunch, and 12 units before supper; hold if <60" 02/10/19  Yes [provider]  insulin glargine (LANTUS) 100 UNIT/ML injection Inject 0.4 mLs (40 Units total) into the skin daily. Patient  taking differently: Inject 40 Units into the skin 2 (two) times daily.  02/22/19  Yes Philemon Kingdom, MD  levothyroxine (SYNTHROID, LEVOTHROID) 112 MCG tablet TAKE 1 TABLET BY MOUTH DAILY Patient taking differently: Take 112 mcg by mouth daily before breakfast.  11/24/17  Yes Nafziger, Tommi Rumps, [name]  Multiple Vitamin (MULTIVITAMIN) tablet Take 1 tablet by mouth daily.     Yes [provider]  omeprazole (PRILOSEC) 20 MG capsule Take 20 mg by mouth daily.  01/25/19  Yes [provider]  ondansetron (ZOFRAN) 4 MG tablet Take 4 mg by mouth every 8 (eight) hours as needed for nausea or vomiting.   Yes [provider]  oxymetazoline (AFRIN) 0.05 % nasal spray Place 2 sprays into both nostrils every 12 (twelve) hours as needed (for nose bleeds).   Yes [provider]  Phenylephrine-APAP-guaiFENesin (TYLENOL SINUS SEVERE) 5-325-200 MG TABS Take 2 tablets by mouth every 4 (four) hours as needed (sinus headaches).   Yes [provider]  polyethylene glycol (MIRALAX / GLYCOLAX) 17 g packet Take 17 g by mouth daily as needed (for constipation).    Yes [provider]  potassium citrate (UROCIT-K) 10 MEQ (1080 MG) SR tablet Take 10 mEq by mouth 2 (two) times daily.   Yes [provider]  sertraline (ZOLOFT) 100 MG tablet Take 100 mg by mouth every evening.  05/24/18  Yes [provider]  vitamin C (ASCORBIC ACID) 250 MG tablet Take 250 mg by mouth daily.   Yes [provider]  Insulin Pen Needle (B-D UF III MINI PEN NEEDLES) 31G X 5 MM MISC Use daily for insulin injection 12/15/16   Nafziger, Tommi Rumps, [name]  Insulin Syringe-Needle U-100 (INSULIN SYRINGE 1CC/30GX5/16") 30G X 5/16" 1 ML MISC 2 (two) times daily.  01/27/17   [provider]    Inpatient Medications: Scheduled Meds:  albuterol  4 puff Inhalation Once   apixaban  2.5 mg Oral BID   atorvastatin  40 mg Oral QHS   cholecalciferol  2,000 Units Oral Daily   diltiazem   180 mg Oral QPM   fenofibrate  54 mg Oral Daily   furosemide  40 mg Intravenous Q6H   insulin aspart  0-20 Units Subcutaneous TID WC   insulin aspart  0-5 Units Subcutaneous QHS  insulin glargine  40 Units Subcutaneous QHS   ipratropium-albuterol  3 mL Nebulization Q4H   levothyroxine  112 mcg Oral Daily   methylPREDNISolone (SOLU-MEDROL) injection  40 mg Intravenous Q12H   multivitamin with minerals  1 tablet Oral Daily   pantoprazole  40 mg Oral Daily   potassium chloride  40 mEq Oral Once   sodium chloride flush  3 mL Intravenous Q12H   vitamin C  250 mg Oral Daily   Continuous Infusions:  albumin human     ceFEPime (MAXIPIME) IV Stopped (07/04/19 2223)   doxycycline (VIBRAMYCIN) IV 100 mg (07/05/19 1119)   vancomycin     PRN Meds: acetaminophen **OR** acetaminophen  Allergies:    Allergies  Allergen Reactions   Adhesive [Tape] Other (See Comments)    PULLS OFF THE SKIN   Apixaban Other (See Comments)    Internal Bleeding   Aspirin Itching, Rash, Hives and Swelling    Swelling of her tongue   Mirabegron Other (See Comments)    Patient experienced A-Fib   Pineapple Anaphylaxis and Swelling    Throat swells and blisters on tongue and roof of mouth per patient   Metformin Diarrhea and Nausea Only   Tetracycline Hives   Fluticasone-Salmeterol Itching and Rash   Iodinated Diagnostic Agents Itching and Rash        Lactose Intolerance (Gi) Other (See Comments)    Gas    Latex Itching, Rash and Other (See Comments)    Pulls off the skin and causes welts   Lisinopril Cough   Metronidazole Other (See Comments)    Reaction not known   Sulfa Antibiotics Rash   Sulfonamide Derivatives Itching and Rash    Social History:   Social History   Socioeconomic History   Marital status: Widowed    Spouse name: Not on file   Number of children: 2   Years of education: Not on file   Highest education level: Not on file  Occupational  History   Occupation: OWNER    Employer: [employer]    Comment: Self employed- runs Product manager strain: Not on file   Food insecurity    Worry: Not on file    Inability: Not on file   Transportation needs    Medical: Not on file    Non-medical: Not on file  Tobacco Use   Smoking status: Former Smoker    Packs/day: 0.25    Years: 20.00    Pack years: 5.00    Types: Cigarettes    Quit date: 09/07/1989    Years since quitting: 29.8   Smokeless tobacco: Never Used  Substance and Sexual Activity   Alcohol use: No   Drug use: No   Sexual activity: Not Currently  Lifestyle   Physical activity    Days per week: Not on file    Minutes per session: Not on file   Stress: Not on file  Relationships   Social connections    Talks on phone: Not on file    Gets together: Not on file    Attends religious service: Not on file    Active member of club or organization: Not on file    Attends meetings of clubs or organizations: Not on file    Relationship status: Not on file   Intimate partner violence    Fear of current or ex partner: Not on file    Emotionally abused: Not on file    Physically  abused: Not on file    Forced sexual activity: Not on file  Other Topics Concern   Not on file  Social History Narrative   Not on file    Family History:    Family History  Problem Relation Age of Onset   Allergies Mother    Clotting disorder Mother    Osteoarthritis Mother    Asthma Mother    Arthritis Other    Diabetes Other    Hyperlipidemia Other    Hypertension Other    Coronary artery disease Other    Stroke Other    Osteoarthritis Daughter    Rheum arthritis Maternal Grandmother    Clotting disorder Maternal Grandmother    Clotting disorder Maternal Uncle    Clotting disorder Daughter    Allergies Daughter    Breast cancer Neg Hx      ROS:  Please see the history of present illness.   All other  ROS reviewed and negative.     Physical Exam/Data:   Vitals:   07/05/19 1045 07/05/19 1200 07/05/19 1300 07/05/19 1315  BP: (!) 160/45 (!) 153/47 (!) 152/47 (!) 147/54  Pulse: 72 69 67 67  Resp: (!) 27 (!) 22 20 (!) 21  Temp:      TempSrc:      SpO2: 92% 93% 96% 95%  Weight:      Height:        Intake/Output Summary (Last 24 hours) at 07/05/2019 1532 Last data filed at 07/04/2019 2210 Gross per 24 hour  Intake --  Output 1000 ml  Net -1000 ml   Last 3 Weights 07/03/2019 06/08/2019 06/06/2019  Weight (lbs) 250 lb 253 lb 1.9 oz 253 lb 3.2 oz  Weight (kg) 113.399 kg 114.814 kg 114.851 kg     Body mass index is 47.24 kg/m.  General:  Ill appearing older WF, dyspneic at rest.  HEENT: normal, [place] in place Lymph: no adenopathy Neck: + JVD Endocrine:  No thryomegaly Vascular: No carotid bruits Cardiac:  normal S1, S2; RRR; no murmur  Lungs:  Diminished anteriorly Abd: soft, nontender, no hepatomegaly  Ext: chronic venous statis  Musculoskeletal:  No deformities, BUE and BLE strength normal and equal Skin: warm and dry  Neuro:  CNs 2-12 intact, no focal abnormalities noted Psych:  Normal affect   EKG:  The EKG was personally reviewed and demonstrates:  Ectopic atrial rhythm  Relevant CV Studies:  TTE: 01/2017   Study Conclusions  - Left ventricle: The cavity size was normal. There was mild   concentric hypertrophy. Systolic function was vigorous. The   estimated ejection fraction was in the range of 65% to 70%. Wall   motion was normal; there were no regional wall motion   abnormalities. Features are consistent with a pseudonormal left   ventricular filling pattern, with concomitant abnormal relaxation   and increased filling pressure (grade 2 diastolic dysfunction).   Doppler parameters are consistent with elevated ventricular   end-diastolic filling pressure. - Aortic valve: Trileaflet; normal thickness leaflets. There was no   regurgitation. - Aortic root: The  aortic root was normal in size. - Mitral valve: Calcified annulus. Mildly thickened leaflets .   There was mild regurgitation. - Left atrium: The atrium was moderately dilated. - Right ventricle: The cavity size was normal. Wall thickness was   normal. Systolic function was normal. - Right atrium: The atrium was normal in size. - Tricuspid valve: There was no regurgitation. - Pulmonary arteries: Systolic pressure was mildly increased. PA  peak pressure: 35 mm Hg (S). - Inferior vena cava: The vessel was normal in size. - Pericardium, extracardiac: There was no pericardial effusion.  Laboratory Data:  High Sensitivity Troponin:   Recent Labs  Lab 07/03/19 1720 07/03/19 2020 07/03/19 2307 07/04/19 0422 07/04/19 1521  TROPONINIHS 14 132* 316* 422* 520*     Chemistry Recent Labs  Lab 07/03/19 1720 07/03/19 1735 07/04/19 0422 07/04/19 1536 07/05/19 0224  NA 142 140   140 143 140 143  K 3.8 3.8   3.8 4.0 4.4 4.3  CL 99 98 104  --  101  CO2 30  --  28  --  30  GLUCOSE 207* 209* 188*  --  222*  BUN 39* 39* 42*  --  53*  CREATININE 2.59* 2.60* 2.56*  --  2.68*  CALCIUM 9.3  --  8.2*  --  8.9  GFRNONAA 17*  --  17*  --  16*  GFRAA 19*  --  20*  --  19*  ANIONGAP 13  --  11  --  12    Recent Labs  Lab 07/03/19 1720  PROT 8.1  ALBUMIN 3.8  AST 33  ALT 30  ALKPHOS 67  BILITOT 0.8   Hematology Recent Labs  Lab 07/04/19 0422 07/04/19 1226 07/04/19 1536 07/05/19 0224  WBC 10.8* 11.8*  --  8.9  RBC 2.78* 2.95*  --  2.86*   2.86*  HGB 8.1* 8.6* 8.2* 8.4*  HCT 27.0* 28.0* 24.0* 27.1*  MCV 97.1 94.9  --  94.8  MCH 29.1 29.2  --  29.4  MCHC 30.0 30.7  --  31.0  RDW 18.5* 18.4*  --  18.4*  PLT 46* 54*  --  46*   BNP Recent Labs  Lab 07/03/19 1720  BNP 285.2*    DDimer No results for input(s): DDIMER in the last 168 hours.   Radiology/Studies:  Ct Chest Wo Contrast  Result Date: 07/04/2019 CLINICAL DATA:  Acute respiratory illness, patient on BiPAP EXAM:  CT CHEST WITHOUT CONTRAST TECHNIQUE: Multidetector CT imaging of the chest was performed following the standard protocol without IV contrast. COMPARISON:  Radiograph 07/04/2019, CT chest 09/15/2017 FINDINGS: Cardiovascular: Mild cardiomegaly with lipomatous hypertrophy of the intra-atrial septum and biatrial enlargement. No pericardial effusion. Coronary artery calcifications are noted. Calcifications noted at the aortic root. Additional calcifications seen throughout the aortic arch and proximal great vessels with a normal caliber aorta and 3 vessel branching pattern. There is central pulmonary artery enlargement. Luminal evaluation is precluded in the absence of contrast media. Major venous structures are unremarkable. Mediastinum/Nodes: Stable appearance of a partially calcified right paraesophageal soft tissue density, 2.1 cm in size (axial 3 image 9). Shotty mediastinal adenopathy is similar to comparison exam without enlarged mediastinal or axillary adenopathy. Hilar lymph nodes are difficult to assess in the absence of contrast media. Lungs/Pleura: Diffuse airways thickening and scattered secretions. Widespread areas of ground-glass are present throughout both lungs. Such findings are superimposed on the more chronic subpleural reticular changes seen on comparison exam. Unable to visualize a right apical nodule seen on comparison due to the underlying parenchymal disease. There is a stable appearance of the 9 mm nodule in the right middle lobe. Scattered subpleural nodules seen on comparison are also difficult to visualize. Few punctate calcifications in both lungs likely reflect calcified granulomata. There are small bilateral effusions, right slightly greater than left. Fluid is also seen tracking along the fissures. Apical emphysematous changes are again noted. Upper Abdomen: Mild  thickening of the left adrenal gland with surrounding hazy stranding, new from prior study. Finding may reflect adrenal  congestion or adjacent process incompletely imaged on this study. Nodular thickening of the right adrenal gland is similar to prior. There is partial fatty replacement of the pancreas. Splenomegaly is noted as well. Musculoskeletal: No acute osseous abnormality or suspicious osseous lesion. Multilevel degenerative changes are present in the imaged portions of the spine. Additional degenerative changes noted in both shoulders. IMPRESSION: 1. Widespread areas of ground-glass throughout both lungs, with associated diffuse airways thickening and scattered secretions, such findings could reflect ARDS, infection including atypical etiologies or severe widespread interstitial and alveolar edema. 2. Small bilateral effusions, right slightly greater than left. 3. Cardiomegaly with lipomatous hypertrophy of the intra-atrial septum and biatrial enlargement. 4. Enlargement of the main pulmonary artery, suggestive of pulmonary arterial hypertension. 5. Mild thickening of the left adrenal gland with surrounding hazy stranding, new from prior study. Finding may reflect adrenal congestion or adjacent process incompletely imaged on this study. 6. Aortic Atherosclerosis (ICD10-I70.0) and Emphysema (ICD10-J43.9). Electronically Signed   By: Lovena Le M.D.   On: 07/04/2019 23:45   Dg Chest Port 1 View  Result Date: 07/04/2019 CLINICAL DATA:  Acute respiratory failure EXAM: PORTABLE CHEST 1 VIEW COMPARISON:  July 03, 2019 FINDINGS: There is mild cardiomegaly. Aortic knob calcifications. There is hazy/fluffy airspace opacities seen throughout both lungs. This appears unchanged since the prior exam. No acute osseous abnormality. IMPRESSION: Bilateral airspace opacities as on the prior exam. This could be due to multifocal pneumonia, ARDS, and/or pulmonary edema. Electronically Signed   By: Prudencio Pair M.D.   On: 07/04/2019 15:41   Dg Chest Port 1 View  Result Date: 07/03/2019 CLINICAL DATA:  Shortness of breath EXAM:  PORTABLE CHEST 1 VIEW COMPARISON:  09/15/2017 FINDINGS: Grossly stable cardiomediastinal contours. Calcific aortic knob. There is pulmonary vascular congestion with diffuse airspace opacities bilaterally. No large pleural fluid collection. Patient positioning partially obscures the lung apices. No pneumothorax identified within these limitations. IMPRESSION: Diffuse bilateral airspace opacities which may reflect pulmonary alveolar edema versus multifocal pneumonia. Electronically Signed   By: Davina Poke M.D.   On: 07/03/2019 17:33    Assessment and Plan:   DAILA ELBERT is a 84 y.o. female with a hx of PAF, HL, DM, HTN, hypothyroidism, CHF, ITP  who is being seen today for the evaluation of elevated troponin at the request of Dr. Dareen [name].  1. Elevated Troponin: suspect demand ischemia in the setting of acute respiratory illness, PNA and CHF. She denies any chest [name] prior to admission. EKG without ischemia. Troponin is elevated but appears to have peaked and trending down. For now would treat medically as she is not a good candidate for invasive work up given her renal dysfunction, anemia and ITP. Has not been on ASA 2/2 to Eliquis. Does not appear that she has been taking oral meds given her need for Bipap. Consider transition to IV heparin during her acute state. -- echo pending, further management based on echo  2. Acute hypoxic respiratory failure: felt to be related to multifocal PNA ( possibly viral) as well as HF. Has been unable to wean from [place], but currently on high flow [place]. Appears tired but tolerating. PCCM consulted and now following.   3. PAF: appears to be in SR vs ectopic atrial rhythm on telemetry. Treated with Diltiazem and Eliquis prior to admission.   4. Chronic diastolic HF: EF noted at 68% back in 2018.  Elevated BNP and CT with edema. Given IV lasix with 1L UOP thus far. IV lasix increased to 40mg  q6hr.  -- echo pending.  5. Thrombocytopenia: Followed by Dr.  Marin [name]. Given Nplate yesterday. Platelet counts remain low 46>>54>>46 today.  6. HL: on statin therapy  7. CKD IV: baseline Cr appears around 2.4-2.5. Will need to follow BMET closely with diuresis.   8. Anemia: Hgb 10.1 on admission with downtrend to 8.9. Given IV iron today. No reported bleeding.   For questions or updates, please contact [place] Please consult [email or web address] for contact info under   Signed, Reino Bellis, [name]  07/05/2019 3:32 PM   History and all data above reviewed.  Patient examined.  I agree with the findings as above. She has SOB since the cardioversion and actually was put O2 about a week ago but had progressive dyspnea.  She presented with acute respiratory distress as above.  She has had no increase in weight.  She has had no edema.  She denies chest [name], neck or arm [name].  She denies a productive cough and does not report fevers.  CT is markedly abnormal as above.    I reviewed the echo images for this visit.  EF is 50 - 55%.   The patient exam reveals COR:RRR  ,  Lungs: Decreased breath sounds with diffuse crackles  ,  Abd: Positive bowel sounds, no rebound no guarding, Ext No edema  .  All available labs, radiology testing, previous records reviewed. Agree with documented assessment and plan.   ELEVATED TROP:  The elevated trop is not suggestive of an acute coronary syndrome.  Likely this is demand ischemia.  Echo with preserved EF.  Not a candidate for an invasive evaluation.  I would not suggest heparin or ASA given the DOAC.   ACUTE RESP FAILURE: Thought to be a primary pulmonary process.  CT reviewed and not typical for edema.  However, I agree with diuresis as her creat allows.    ATRIAL FIB:  Appears to be maintaining NSR.  Continue Eliquis.   QT:  Markedly prolonged. Avoiding QT prolonging meds.  We will follow with repeat EKGs.   CKD:  Stable creat.      Jeneen Rinks Shyheim Tanney  5:15 PM  07/05/2019

## 2019-07-05 NOTE — Progress Notes (Addendum)
Subjective:   [name] reports she is feeling a bit better today after being able to sleep great last night. Her daughter remained at bedside. Daughter notes that [name] seems more alert today than previously. She also states that respiratory therapy did try and wean her by decreasing the FiO2 down, but saturation dropped to the mid 80s, so it was increased back up.   We discussed the imaging findings and that this is likely a pneumonia but we plan to continue IV Lasix to ensure volume status does not exacerbate. We discussed the addition of another antibiotic and they are in agreement with this plan.   Objective:  Vital signs in last 24 hours: Vitals:   07/05/19 0225 07/05/19 0318 07/05/19 0456 07/05/19 0607  BP: (!) 134/45  (!) 137/54 (!) 145/52  Pulse: 72  69 66  Resp: (!) 24  (!) 24 (!) 24  Temp:      TempSrc:      SpO2: 95% 93% 92% 94%  Weight:      Height:       Physical Exam Vitals signs and nursing note reviewed.  Constitutional:      General: She is not in acute distress.    Appearance: She is obese.  Neck:     Vascular: No JVD.  Pulmonary:     Effort: Tachypnea and accessory muscle usage present. No respiratory distress.     Breath sounds: Examination of the right-middle field reveals decreased breath sounds. Examination of the left-middle field reveals decreased breath sounds. Examination of the right-lower field reveals decreased breath sounds. Examination of the left-lower field reveals decreased breath sounds. Decreased breath sounds present. No wheezing, rhonchi or rales.  Abdominal:     General: Bowel sounds are normal.     Palpations: Abdomen is soft.     Tenderness: There is no abdominal tenderness. There is no guarding.  Musculoskeletal:     Right lower leg: No edema.     Left lower leg: No edema.  Skin:    General: Skin is warm and dry.     Comments: Signs of venous stasis in bilateral lower extremities,chronic  Neurological:     Mental Status:  She is alert and oriented to person, place, and time.  Psychiatric:        Mood and Affect: Mood normal. Mood is not anxious.        Behavior: Behavior normal.    Assessment/Plan:  Active Problems:   Acute hypoxemic respiratory failure [facility])  [name] is a 84 y/o female with PMHx of HTN, CKD IV, CHF, OSA and A.fib presenting with dyspnea and increasing oxygen requirement for one week duration that has acutely worsened on 10/26 in the afternoon. CXR concerning for pulmonary edema vs bilateral pneumonia. Initial ABG with respiratory acidosis.   # Acute hypoxic respiratory failure Patient presented with sudden onset dyspnea in setting of increased O2 requirement for 1 week after cardioversion. It could not be determined why the new oxygen requirement, but it was initially thought to be secondary to bronchitis, per chest xray in the outpatient setting. Additionally, patient has a history of HFpEF (Echo 01/2017 with EF 65-70%). She is on an alternating schedule of 40mg  and 20mg  of Lasix daily and reports compliance.   Initial chest xray at the ED (10/26) showed diffuse bilateral airspace opacities, likely edema vs pneumonia. After diuresis, chest xray was repeated did not show significant improvement. For further evaluation, a CT was order last night and  showed widespread ground glass opacities bilaterally with airway thickening and scattered secretions, possibly ARDS vs infection vs edema. Pro-calcitonin was elevated at 4.65 and increased today to 4.77. On the bright side, leukocytosis has resolved. She continues to be hemodynamically stable. Patient has been receiving Vancomycin and Cefepime since admission on 10.16, but Doxycycline was added for atypical coverage. Unfortunately cannot use Azithromycin due to QTc prolongation. We attempted to wean her off Bipap to HFNC, and she satted well in the mid 90s% on 15L but had to be transitioned back to bipap after developing increased work of  breathing. Due to this, PCCM has been consulted.   Overall, more likely multifocal pneumonia rather than HF exacerbation. TTE was ordered for evaluate for new reduced EF but it is still pending.   - PCCM consulted  - Continue BiPAP and wean as tolerated - IV Lasix 40mg  qd - Continue Vancomycin, Cefepime, Doxycycline  - Duoneb 61ml q4h  - Strict I/O's and daily weights  # Troponinemia:  Likely secondary to demand ischemia in setting of hypertensive urgency and respiratory failure on presentation. Patient continues to be hemodynamically stable. After speaking to PCCM, consulted cardiology and ordered repeat troponin. Unfortunately, echo is still pending but should be done in the next hour or two.   - Troponin pending  - TTE pending  - Cardiology consulted   # QTc Prolongation:  Patient with recent cardioversion for atypical atrial flutter. Her initial EKG on presentation with QTc 496. Repeat EKG today with QTc at 595. At this time, continue home Diltiazem and avoid QTc prolonging agents as much as possible.   - Cardiac monitoring - Diltiazem 180mg  qd - Avoid QT prolonging agents   # Hypertension:  Patient with history of hypertension and initially presented with SBP >200. She was placed on nitro gtt in the ED and BP responded well. Since then, her BP remains in the average 161-096E systolic. Will continue with current medication regimen and monitor closely.   - Diltiazem 180mg  qd  # Thrombocytopenia # Chronic ITP Platelets were 64 on admission and remained stable since. Patient has a history of chronic ITP for which she receives weekly N-plate infusions and last one was on 10/23 in the outpatient setting. Dr. Marin Olp, her primary oncologist, has ordered N-plate (Romiplostim) to be administered yesterday. Will monitor closely. Dr. Marin Olp has restarted Elliquis but at 1/2 dose.   - CBC tomorrow A.M.  - Restart Elliquis   # Normocytic Anemia:  Initial hemoglobin on admission was  10.1 and downtrended to 8.9 today. Iron studies show normal ferritin but low iron levels and iron saturation. TIBC is normal but on the higher end. Dr. Marin Olp had initially ordered a iron infusion for today but in light of acute severe infection, will hold for now. Her CKD could also be contributing to anemia.   - CBC tomorrow A.M.   # CKD Stage IV:  Stable.   - Avoid nephrotoxic agents - BMP in the A.M.   # Diabetes mellitus:  Patient with history of DMII with insulin requirement. Her PCP left a note recently with her home regimen, which is notably quite extensive. While admitted and NPO, will continue with half of daily Lantus and SSI.   - CBG monitoring - Lantus 40U + SSI   # Klebsiella UTI Urine cultures drawn on admission are currently growing Klebsiella that is sensitive to everything but Ampicillin. She is not currently complaining of any urinary symptoms but she is on multiple antibiotics for acute respiratory  failure, so it is likely still getting treated. This could reflect a chronic colonization instead of an acute issue, as her last urine cultures were back in 2017.    Dispo: Anticipated discharge pending medical improvement.    Dr. Jose Persia Internal Medicine PGY-1  Pager: [phone] 07/05/2019, 7:23 AM

## 2019-07-05 NOTE — Progress Notes (Signed)
  Echocardiogram 2D Echocardiogram has been performed.  [name] 07/05/2019, 3:29 PM

## 2019-07-05 NOTE — ED Notes (Signed)
Another person is at the bedside I am unable to get to the pt to give meds

## 2019-07-05 NOTE — ED Notes (Signed)
Report given to rn on 3e 

## 2019-07-05 NOTE — ED Notes (Signed)
Admitting doctor at   The bedside daughter at bedside also

## 2019-07-05 NOTE — Progress Notes (Signed)
Pt transported from ED to 3e25 on bipap without incidence

## 2019-07-05 NOTE — ED Notes (Signed)
Pt cleaned and bed cleaned pure wick leaking  Pt more comfortable

## 2019-07-06 ENCOUNTER — Encounter (HOSPITAL_COMMUNITY): Payer: Self-pay

## 2019-07-06 ENCOUNTER — Inpatient Hospital Stay: Payer: Medicare Other

## 2019-07-06 ENCOUNTER — Other Ambulatory Visit: Payer: Self-pay

## 2019-07-06 DIAGNOSIS — D509 Iron deficiency anemia, unspecified: Secondary | ICD-10-CM | POA: Diagnosis not present

## 2019-07-06 DIAGNOSIS — R5383 Other fatigue: Secondary | ICD-10-CM

## 2019-07-06 DIAGNOSIS — D693 Immune thrombocytopenic purpura: Secondary | ICD-10-CM | POA: Diagnosis not present

## 2019-07-06 DIAGNOSIS — J189 Pneumonia, unspecified organism: Secondary | ICD-10-CM | POA: Insufficient documentation

## 2019-07-06 DIAGNOSIS — J9601 Acute respiratory failure with hypoxia: Secondary | ICD-10-CM | POA: Diagnosis not present

## 2019-07-06 DIAGNOSIS — R0602 Shortness of breath: Secondary | ICD-10-CM | POA: Diagnosis not present

## 2019-07-06 LAB — BASIC METABOLIC PANEL
Anion gap: 11 (ref 5–15)
BUN: 61 mg/dL — ABNORMAL HIGH (ref 8–23)
CO2: 30 mmol/L (ref 22–32)
Calcium: 8.9 mg/dL (ref 8.9–10.3)
Chloride: 103 mmol/L (ref 98–111)
Creatinine, Ser: 2.57 mg/dL — ABNORMAL HIGH (ref 0.44–1.00)
GFR calc Af Amer: 20 mL/min — ABNORMAL LOW (ref >60)
GFR calc non Af Amer: 17 mL/min — ABNORMAL LOW (ref >60)
Glucose, Bld: 283 mg/dL — ABNORMAL HIGH (ref 70–99)
Potassium: 4.4 mmol/L (ref 3.5–5.1)
Sodium: 144 mmol/L (ref 135–145)

## 2019-07-06 LAB — CBC
HCT: 26 % — ABNORMAL LOW (ref 36.0–46.0)
Hemoglobin: 8.1 g/dL — ABNORMAL LOW (ref 12.0–15.0)
MCH: 28.8 pg (ref 26.0–34.0)
MCHC: 31.2 g/dL (ref 30.0–36.0)
MCV: 92.5 fL (ref 80.0–100.0)
Platelets: 76 10*3/uL — ABNORMAL LOW (ref 150–400)
RBC: 2.81 MIL/uL — ABNORMAL LOW (ref 3.87–5.11)
RDW: 18 % — ABNORMAL HIGH (ref 11.5–15.5)
WBC: 12 10*3/uL — ABNORMAL HIGH (ref 4.0–10.5)
nRBC: 0 % (ref 0.0–0.2)

## 2019-07-06 LAB — CBC WITH DIFFERENTIAL/PLATELET
Abs Immature Granulocytes: 0.11 10*3/uL — ABNORMAL HIGH (ref 0.00–0.07)
Basophils Absolute: 0 10*3/uL (ref 0.0–0.1)
Basophils Relative: 0 %
Eosinophils Absolute: 0 10*3/uL (ref 0.0–0.5)
Eosinophils Relative: 0 %
HCT: 25.2 % — ABNORMAL LOW (ref 36.0–46.0)
Hemoglobin: 7.6 g/dL — ABNORMAL LOW (ref 12.0–15.0)
Immature Granulocytes: 1 %
Lymphocytes Relative: 3 %
Lymphs Abs: 0.3 10*3/uL — ABNORMAL LOW (ref 0.7–4.0)
MCH: 28.7 pg (ref 26.0–34.0)
MCHC: 30.2 g/dL (ref 30.0–36.0)
MCV: 95.1 fL (ref 80.0–100.0)
Monocytes Absolute: 0.3 10*3/uL (ref 0.1–1.0)
Monocytes Relative: 3 %
Neutro Abs: 9.2 10*3/uL — ABNORMAL HIGH (ref 1.7–7.7)
Neutrophils Relative %: 93 %
Platelets: 56 10*3/uL — ABNORMAL LOW (ref 150–400)
RBC: 2.65 MIL/uL — ABNORMAL LOW (ref 3.87–5.11)
RDW: 18.3 % — ABNORMAL HIGH (ref 11.5–15.5)
WBC: 9.9 10*3/uL (ref 4.0–10.5)
nRBC: 0 % (ref 0.0–0.2)

## 2019-07-06 LAB — MAGNESIUM: Magnesium: 2.3 mg/dL (ref 1.7–2.4)

## 2019-07-06 LAB — PROCALCITONIN: Procalcitonin: 3.05 ng/mL

## 2019-07-06 LAB — MRSA PCR SCREENING: MRSA by PCR: POSITIVE — AB

## 2019-07-06 LAB — GLUCOSE, CAPILLARY
Glucose-Capillary: 266 mg/dL — ABNORMAL HIGH (ref 70–99)
Glucose-Capillary: 244 mg/dL — ABNORMAL HIGH (ref 70–99)
Glucose-Capillary: 270 mg/dL — ABNORMAL HIGH (ref 70–99)
Glucose-Capillary: 274 mg/dL — ABNORMAL HIGH (ref 70–99)

## 2019-07-06 LAB — C-REACTIVE PROTEIN: CRP: 16.1 mg/dL — ABNORMAL HIGH (ref <1.0)

## 2019-07-06 MED ORDER — INFLUENZA VAC A&B SA ADJ QUAD 0.5 ML IM PRSY
0.5000 mL | PREFILLED_SYRINGE | INTRAMUSCULAR | Status: DC
  Filled 2019-07-06: qty 0.5

## 2019-07-06 MED ORDER — IPRATROPIUM-ALBUTEROL 0.5-2.5 (3) MG/3ML IN SOLN
3.0000 mL | Freq: Four times a day (QID) | RESPIRATORY_TRACT | Status: DC
  Administered 2019-07-06 – 2019-07-07 (×4): 3 mL via RESPIRATORY_TRACT
  Filled 2019-07-06 (×4): qty 3

## 2019-07-06 MED ORDER — FUROSEMIDE 10 MG/ML IJ SOLN
40.0000 mg | Freq: Two times a day (BID) | INTRAMUSCULAR | Status: DC
  Administered 2019-07-06: 40 mg via INTRAVENOUS
  Filled 2019-07-06: qty 4

## 2019-07-06 MED ORDER — INSULIN GLARGINE 100 UNIT/ML ~~LOC~~ SOLN
50.0000 [IU] | Freq: Every day | SUBCUTANEOUS | Status: DC
  Administered 2019-07-06: 50 [IU] via SUBCUTANEOUS
  Filled 2019-07-06 (×2): qty 0.5

## 2019-07-06 MED ORDER — MUPIROCIN 2 % EX OINT
1.0000 "application " | TOPICAL_OINTMENT | Freq: Two times a day (BID) | CUTANEOUS | Status: AC
  Administered 2019-07-06 – 2019-07-10 (×9): 1 via NASAL
  Filled 2019-07-06 (×2): qty 22

## 2019-07-06 MED ORDER — ORAL CARE MOUTH RINSE
15.0000 mL | Freq: Two times a day (BID) | OROMUCOSAL | Status: DC
  Administered 2019-07-06 – 2019-07-18 (×22): 15 mL via OROMUCOSAL

## 2019-07-06 MED ORDER — CHLORHEXIDINE GLUCONATE CLOTH 2 % EX PADS
6.0000 | MEDICATED_PAD | Freq: Every day | CUTANEOUS | Status: DC
  Administered 2019-07-08 – 2019-07-18 (×10): 6 via TOPICAL

## 2019-07-06 MED ORDER — CHLORHEXIDINE GLUCONATE CLOTH 2 % EX PADS: 6.0000 | MEDICATED_PAD | Freq: Every day | CUTANEOUS | Status: DC

## 2019-07-06 MED ORDER — PANTOPRAZOLE SODIUM 40 MG IV SOLR
40.0000 mg | INTRAVENOUS | Status: DC
  Administered 2019-07-06 – 2019-07-09 (×4): 40 mg via INTRAVENOUS
  Filled 2019-07-06 (×4): qty 40

## 2019-07-06 NOTE — Progress Notes (Signed)
Inpatient Diabetes Program Recommendations  AACE/ADA: New Consensus Statement on Inpatient Glycemic Control (2015)  Target Ranges:  Prepandial:   less than 140 mg/dL      Peak postprandial:   less than 180 mg/dL (1-2 hours)      Critically ill patients:  140 - 180 mg/dL   Lab Results  Component Value Date   GLUCAP 244 (H) 07/06/2019   HGBA1C 6.3 (A) 02/22/2019    Review of Glycemic Control Results for [name], [name] (MRN [id number]) as of 07/06/2019 13:47  Ref. Range 07/05/2019 18:31 07/05/2019 19:41 07/05/2019 23:36 07/06/2019 05:42 07/06/2019 12:38  Glucose-Capillary Latest Ref Range: 70 - 99 mg/dL 202 (H) 216 (H) 271 (H) 266 (H) 244 (H)   Diabetes history: DM2 Outpatient Diabetes medications: Lantus 40 units bid Current orders for Inpatient glycemic control: Lantus 40 units qd + Novolog correction resistant tid + hs 0-5 units  Inpatient Diabetes Program Recommendations:   Consider increase in Lantus to 50 units qd  Thank you, Nani Gasser. Iyesha Such, RN, MSN, CDE  Diabetes Coordinator Inpatient Glycemic Control Team Team Pager [phone] (8am-5pm) 07/06/2019 1:50 PM

## 2019-07-06 NOTE — Progress Notes (Signed)
Internal Medicine Attending:   I saw and examined the patient. I reviewed the resident's note and I agree with the resident's findings and plan as documented in the resident's note.  Patient states that she feels okay today but complains of persistent shortness of breath.  She remains BiPAP dependent currently.  Patient is initially to the hospital with acute hypoxic respiratory failure of unclear etiology.  CT chest shows bilateral pulmonary infiltrates.  I suspect this is likely secondary to an underlying infection.  We will continue with vancomycin, cefepime and doxycycline for now.  Continue to wean BiPAP as tolerated (currently on 50% FiO2).  PCCM follow-up and recommendations appreciated.  We will continue to attempt to wean patient off BiPAP and have patient on high flow nasal cannula during the day and BiPAP at night.  Patient also noted to have mildly elevated troponins likely secondary to demand ischemia.  2D echo results noted -no wall motion abnormality, grade 1 diastolic dysfunction, EF of 50 to 55%.  Patient decreased to Lasix IV 40 mg twice daily and likely transition oral Lasix tomorrow.  I do not believe that the infiltrates on her CT are secondary to heart failure as she has not responded to aggressive diuresis and has no evidence of volume overload currently.  Patient also noted to have prolonged QT.  Will repeat EKG in the morning.  We will continue to monitor closely.

## 2019-07-06 NOTE — TOC Initial Note (Signed)
Transition of Care [facility]) - Initial/Assessment Note    Patient Details  Name: [name] MRN: [id number] Date of Birth: 09/18/1934  [facility] CM/SW Contact:    Alberteen Sam, [place] Phone Number: [phone] 07/06/2019, 3:12 PM  Clinical Narrative:                  CSW met with patient and daughter [name] at bedside. Confirmed patient is from [facility] and was mostly independent prior to admission. CSW informed [name] that CSW will continue to follow along for patient's discharge planning and update [facility] as needed. CSW informed [name] that if patient does not improve to baseline potential dc plan may be SNF at [facility] before transitioning back to ALF side at [facility] based on PT recommendation, [name] expressed understanding and gave CSW permission to also speak with her sister [name] regarding any updates.   Pending PT/OT recs at this time.   Expected Discharge Plan: Assisted Living Barriers to Discharge: Continued Medical Work up   Patient Goals and CMS Choice Patient states their goals for this hospitalization and ongoing recovery are:: to go back to [facility] CMS Medicare.gov Compare Post Acute Care list provided to:: Patient Choice offered to / list presented to : Patient  Expected Discharge Plan and Services Expected Discharge Plan: Assisted Living       Living arrangements for the past 2 months: Assisted Living Facility([facility])                                      Prior Living Arrangements/Services Living arrangements for the past 2 months: Assisted Living Facility([facility]) Lives with:: Self Patient language and need for interpreter reviewed:: Yes Do you feel safe going back to the place where you live?: Yes      Need for Family Participation in Patient Care: Yes (Comment) Care giver support system in place?: Yes (comment)   Criminal Activity/Legal Involvement Pertinent to Current  Situation/Hospitalization: No - Comment as needed  Activities of Daily Living Home Assistive Devices/Equipment: CPAP ADL Screening (condition at time of admission) Patient's cognitive ability adequate to safely complete daily activities?: Yes Is the patient deaf or have difficulty hearing?: No Does the patient have difficulty seeing, even when wearing glasses/contacts?: No Does the patient have difficulty concentrating, remembering, or making decisions?: No Patient able to express need for assistance with ADLs?: No Does the patient have difficulty dressing or bathing?: Yes Independently performs ADLs?: No Communication: Independent Dressing (OT): Needs assistance Grooming: Needs assistance Feeding: Needs assistance Bathing: Needs assistance Toileting: Needs assistance In/Out Bed: Needs assistance Walks in Home: Needs assistance Does the patient have difficulty walking or climbing stairs?: Yes Weakness of Legs: Both Weakness of Arms/Hands: None  Permission Sought/Granted Permission sought to share information with : Case Manager, Customer service manager, Family Supports Permission granted to share information with : Yes, Verbal Permission Granted  Share Information with NAME: [name]  Permission granted to share info w AGENCY: SNF/ALF  Permission granted to share info w Relationship: daughter  Permission granted to share info w Contact Information: [phone]  Emotional Assessment Appearance:: Appears stated age Attitude/Demeanor/Rapport: Gracious Affect (typically observed): Calm Orientation: : Oriented to Self, Oriented to Place, Oriented to  Time, Oriented to Situation Alcohol / Substance Use: Not Applicable Psych Involvement: No (comment)  Admission diagnosis:  Normocytic anemia [D64.9] Acute pulmonary edema (HCC) [J81.0] Chronic ITP (idiopathic thrombocytopenia) (HCC) [D69.3] Acute respiratory failure  with hypoxia ([place]) [J96.01] Acute respiratory failure  with hypercapnia ([place]) [J96.02] Hypertensive emergency [I16.1] Atypical atrial flutter [facility]) [I48.4] Patient Active Problem List   Diagnosis Date Noted  . HCAP (healthcare-associated pneumonia)   . Acute hypoxemic respiratory failure ([place]) 07/03/2019  . Atypical atrial flutter ([place])   . Normocytic anemia 05/18/2017  . Chronic ITP (idiopathic thrombocytopenia) (HCC) 01/25/2017  . Chronic kidney disease (CKD) stage G4/A1, severely decreased glomerular filtration rate (GFR) between 15-29 mL/min/1.73 square meter and albuminuria creatinine ratio less than 30 mg/g (HCC) 09/04/2015  . Chronic kidney disease (CKD), stage IV (severe) ([place]) 09/04/2015  . GOUT 06/23/2010  . DERMATITIS 01/16/2010  . BACK PAIN 06/13/2009  . MYALGIA 06/13/2009  . Anxiety state 12/13/2008  . LEG EDEMA, CHRONIC 11/08/2008  . Sleep apnea 10/26/2008  . Hypothyroidism 09/27/2008  . HYPERCHOLESTEROLEMIA 09/27/2008  . OBESITY 09/27/2008  . DEPRESSION 09/27/2008  . Essential hypertension 09/27/2008  . ALLERGIC RHINITIS 09/27/2008  . ASTHMA, CHILDHOOD 09/27/2008   PCP:  Javier Glazier, MD Pharmacy:   [facility], [place] - [address] [place] [address] Phone: [phone] Fax: [phone]     Social Determinants of Health (SDOH) Interventions    Readmission Risk Interventions No flowsheet data found.

## 2019-07-06 NOTE — Progress Notes (Signed)
Subjective:   [name] is wearing the Bipap on bedside rounds, and has difficulty speaking with it. She did report her SOB is unchanged. She denies any acute pain or N/V.   We dicussed that prognosis is still guarded in light of increasing oxygen need even on Bipap. Daughter at bedside and another daughter on the phone expressed understanding. [name] was very tired and dozed off.   Objective:  Vital signs in last 24 hours: Vitals:   07/06/19 0321 07/06/19 0635 07/06/19 0700 07/06/19 0757  BP:  (!) 139/45 (!) 140/45   Pulse:  64 64   Resp:  20 (!) 23   Temp:  98.1 F (36.7 C) 98.5 F (36.9 C)   TempSrc:  Oral Oral   SpO2: 95% 98% 98% 96%  Weight:  111.2 kg    Height:  5\' 1"  (1.549 m)     Physical Exam Vitals signs and nursing note reviewed.  Constitutional:      General: She is not in acute distress.    Appearance: She is obese.  Neck:     Vascular: No JVD.  Pulmonary:     Effort: Tachypnea and accessory muscle usage present. No respiratory distress.     Breath sounds: Examination of the right-middle field reveals decreased breath sounds. Examination of the left-middle field reveals decreased breath sounds. Examination of the right-lower field reveals decreased breath sounds. Examination of the left-lower field reveals decreased breath sounds. Decreased breath sounds (Slightly worsened since admission) present. No wheezing, rhonchi or rales.  Abdominal:     General: Bowel sounds are normal.     Palpations: Abdomen is soft.     Tenderness: There is no abdominal tenderness. There is no guarding.  Musculoskeletal:     Right lower leg: No edema.     Left lower leg: No edema.  Skin:    General: Skin is warm and dry.     Comments: Signs of venous stasis in bilateral lower extremities,chronic. More wrinkles today compared to prior exam  Neurological:     Mental Status: She is alert and oriented to person, place, and time.  Psychiatric:        Mood and Affect: Mood  normal. Mood is not anxious.        Behavior: Behavior normal.    Assessment/Plan:  Active Problems:   Acute hypoxemic respiratory failure [facility])  [name] is a 84 y/o female with PMHx of HTN, CKD IV, CHF, OSA and A.fib presenting with dyspnea and increasing oxygen requirement for one week duration that has acutely worsened on 10/26 in the afternoon. CXR concerning for pulmonary edema vs bilateral pneumonia. Initial ABG with respiratory acidosis.   # Acute hypoxic respiratory failure Patient presented with sudden onset dyspnea in setting of increased O2 requirement for 1 week after cardioversion. It could not be determined why the new oxygen requirement, but it was initially thought to be secondary to bronchitis, per chest xray in the outpatient setting. Additionally, patient has a history of HFpEF (Echo 01/2017 with EF 65-70%). She is on an alternating schedule of 40mg  and 20mg  of Lasix daily and reports compliance.   Initial chest xray at the ED (10/26) showed diffuse bilateral airspace opacities, likely edema vs pneumonia. CT on 10/26 showed widespread ground glass opacities bilaterally with airway thickening and scattered secretions, possibly ARDS vs infection vs edema. Procalcitonin has begun to trend downwards, which is a good sign, as it supports coverage of infection with addition of Doxycycline. However,  she is requiring more FiO2 on the BiPAP, and this is very concerning. She is at high risk of intubation at this time. PCCM is on board. Cards feels this is not a cardiac issue but predominantly pulmonary; okay to continue diuresing for today but they will wean off tomorrow.   - PCCM consulted  - Continue BiPAP and wean as tolerated - IV Lasix 40mg  q6h - Continue Vancomycin, Cefepime, Doxycycline  - Duoneb 47ml q4h  - Strict I/O's and daily weights  # Troponinemia: Resolving Likely secondary to demand ischemia in setting of hypertensive urgency and respiratory failure on  presentation. Cardiology consulted and concurred with. No ischemic work up planned.   # QTc Prolongation:  Patient with recent cardioversion for atypical atrial flutter. Her initial EKG on presentation with QTc 496.  At this time, continue home Diltiazem and avoid QTc prolonging agents as much as possible. Cardiology is following with daily EKGs   - Cardiology on board and we appreciate their recommendations  - Daily EKGs - Cardiac monitoring - Diltiazem 180mg  qd - Avoid QT prolonging agents   # Hypertension:  Patient with history of hypertension and initially presented with SBP >200. She was placed on nitro gtt in the ED and BP responded well. Since then, her BP remains in the average 009-381W systolic. No changes to medication at this time.   - Diltiazem 180mg  qd  # Thrombocytopenia # Chronic ITP Platelets were 64 on admission and remained stable since. Patient has a history of chronic ITP for which she receives weekly N-plate infusions and last one was on 10/23 in the outpatient setting. Dr. Marin Olp, her primary oncologist, has ordered N-plate (Romiplostim) to be administered on 10.27.  - Oncology is following and we appreciate their recommendations.  - CBC daily   # Normocytic Anemia:  Initial hemoglobin on admission was 10.1 and downtrended to 8.9 today. Iron studies show normal ferritin but low iron levels and iron saturation. TIBC is normal but on the higher end. Holding IV Feraheme until resolution of acute infection.   - CBC daily  # CKD Stage IV:  Stable.   - Avoid nephrotoxic agents - BMP in the A.M.   # Diabetes mellitus:  Patient with history of DMII with insulin requirement. Her PCP left a note recently with her home regimen, which is notably quite extensive. She may require titration up as her sugars are in the mid 200s consistently. Will increase Lantus to 50 units at bedtime.   - CBG monitoring - Lantus 50U at bedtime - Resistant SSI   # Klebsiella UTI  Urine cultures drawn on admission are currently growing Klebsiella that is sensitive to everything but Ampicillin. She is not currently complaining of any urinary symptoms but she is on multiple antibiotics for acute respiratory failure, so it is likely still getting treated. This could reflect a chronic colonization instead of an acute issue, as her last urine cultures were back in 2017.    Dispo: Anticipated discharge pending clinical improvement.    Dr. Jose Persia Internal Medicine PGY-1  Pager: [phone] 07/06/2019, 9:37 AM

## 2019-07-06 NOTE — Progress Notes (Signed)
Placed BiPAP on standby, placed patient on 12LHFNC, per Dr Bari Mantis request (X2) SATS 90% patient stated that she is breathing fine but anxious about breathing getting worse, I assured patient and the family member if that happens  we will place her back on the BiPAP, RN notified.

## 2019-07-06 NOTE — Progress Notes (Addendum)
HEMATOLOGY-ONCOLOGY PROGRESS NOTE  SUBJECTIVE: Remains very fatigued and short of breath.  She is still on BiPAP.  PCCM is following closely and has her on steroids and empiric antibiotics.  Attempting to wean to high flow nasal cannula during the daytime and BiPAP at bedtime. Has nonproductive cough.  Denies bleeding.  Daughter is at the bedside.  REVIEW OF SYSTEMS:   As noted in the HPI.  I have reviewed the past medical history, past surgical history, social history and family history with the patient and they are unchanged from previous note.   PHYSICAL EXAMINATION:  Vitals:   07/06/19 1335 07/06/19 1345  BP:    Pulse:    Resp:    Temp:  98 F (36.7 C)  SpO2: 91%    Filed Weights   07/03/19 1722 07/06/19 0635  Weight: 250 lb (113.4 kg) 245 lb 3.2 oz (111.2 kg)    Intake/Output from previous day: 10/28 0701 - 10/29 0700 In: 703.3 [IV Piggyback:703.3] Out: 1600 [Urine:1600]  GENERAL:alert, no distress and comfortable SKIN: skin color, texture, turgor are normal, no rashes or significant lesions EYES: normal, Conjunctiva are pink and non-injected, sclera clear OROPHARYNX:no exudate, no erythema and lips, buccal mucosa, and tongue normal  NECK: supple, thyroid normal size, non-tender, without nodularity LYMPH:  no palpable lymphadenopathy in the cervical, axillary or inguinal LUNGS: clear to auscultation and percussion with normal breathing effort HEART: regular rate & rhythm and no murmurs and no lower extremity edema ABDOMEN:abdomen soft, non-tender and normal bowel sounds Musculoskeletal:no cyanosis of digits and no clubbing  NEURO: alert & oriented x 3 with fluent speech, no focal motor/sensory deficits  LABORATORY DATA:  I have reviewed the data as listed CMP Latest Ref Rng & Units 07/06/2019 07/05/2019 07/04/2019  Glucose 70 - 99 mg/dL 283(H) 222(H) -  BUN 8 - 23 mg/dL 61(H) 53(H) -  Creatinine 0.44 - 1.00 mg/dL 2.57(H) 2.68(H) -  Sodium 135 - 145 mmol/L 144 143  140  Potassium 3.5 - 5.1 mmol/L 4.4 4.3 4.4  Chloride 98 - 111 mmol/L 103 101 -  CO2 22 - 32 mmol/L 30 30 -  Calcium 8.9 - 10.3 mg/dL 8.9 8.9 -  Total Protein 6.5 - 8.1 g/dL - - -  Total Bilirubin 0.3 - 1.2 mg/dL - - -  Alkaline Phos 38 - 126 U/L - - -  AST 15 - 41 U/L - - -  ALT 0 - 44 U/L - - -    Lab Results  Component Value Date   WBC 9.9 07/06/2019   HGB 7.6 (L) 07/06/2019   HCT 25.2 (L) 07/06/2019   MCV 95.1 07/06/2019   PLT 56 (L) 07/06/2019   NEUTROABS 9.2 (H) 07/06/2019    Ct Chest Wo Contrast  Result Date: 07/04/2019 CLINICAL DATA:  Acute respiratory illness, patient on BiPAP EXAM: CT CHEST WITHOUT CONTRAST TECHNIQUE: Multidetector CT imaging of the chest was performed following the standard protocol without IV contrast. COMPARISON:  Radiograph 07/04/2019, CT chest 09/15/2017 FINDINGS: Cardiovascular: Mild cardiomegaly with lipomatous hypertrophy of the intra-atrial septum and biatrial enlargement. No pericardial effusion. Coronary artery calcifications are noted. Calcifications noted at the aortic root. Additional calcifications seen throughout the aortic arch and proximal great vessels with a normal caliber aorta and 3 vessel branching pattern. There is central pulmonary artery enlargement. Luminal evaluation is precluded in the absence of contrast media. Major venous structures are unremarkable. Mediastinum/Nodes: Stable appearance of a partially calcified right paraesophageal soft tissue density, 2.1 cm in size (axial  3 image 9). Shotty mediastinal adenopathy is similar to comparison exam without enlarged mediastinal or axillary adenopathy. Hilar lymph nodes are difficult to assess in the absence of contrast media. Lungs/Pleura: Diffuse airways thickening and scattered secretions. Widespread areas of ground-glass are present throughout both lungs. Such findings are superimposed on the more chronic subpleural reticular changes seen on comparison exam. Unable to visualize a right  apical nodule seen on comparison due to the underlying parenchymal disease. There is a stable appearance of the 9 mm nodule in the right middle lobe. Scattered subpleural nodules seen on comparison are also difficult to visualize. Few punctate calcifications in both lungs likely reflect calcified granulomata. There are small bilateral effusions, right slightly greater than left. Fluid is also seen tracking along the fissures. Apical emphysematous changes are again noted. Upper Abdomen: Mild thickening of the left adrenal gland with surrounding hazy stranding, new from prior study. Finding may reflect adrenal congestion or adjacent process incompletely imaged on this study. Nodular thickening of the right adrenal gland is similar to prior. There is partial fatty replacement of the pancreas. Splenomegaly is noted as well. Musculoskeletal: No acute osseous abnormality or suspicious osseous lesion. Multilevel degenerative changes are present in the imaged portions of the spine. Additional degenerative changes noted in both shoulders. IMPRESSION: 1. Widespread areas of ground-glass throughout both lungs, with associated diffuse airways thickening and scattered secretions, such findings could reflect ARDS, infection including atypical etiologies or severe widespread interstitial and alveolar edema. 2. Small bilateral effusions, right slightly greater than left. 3. Cardiomegaly with lipomatous hypertrophy of the intra-atrial septum and biatrial enlargement. 4. Enlargement of the main pulmonary artery, suggestive of pulmonary arterial hypertension. 5. Mild thickening of the left adrenal gland with surrounding hazy stranding, new from prior study. Finding may reflect adrenal congestion or adjacent process incompletely imaged on this study. 6. Aortic Atherosclerosis (ICD10-I70.0) and Emphysema (ICD10-J43.9). Electronically Signed   By: Lovena Le M.D.   On: 07/04/2019 23:45   Dg Chest Port 1 View  Result Date:  07/04/2019 CLINICAL DATA:  Acute respiratory failure EXAM: PORTABLE CHEST 1 VIEW COMPARISON:  July 03, 2019 FINDINGS: There is mild cardiomegaly. Aortic knob calcifications. There is hazy/fluffy airspace opacities seen throughout both lungs. This appears unchanged since the prior exam. No acute osseous abnormality. IMPRESSION: Bilateral airspace opacities as on the prior exam. This could be due to multifocal pneumonia, ARDS, and/or pulmonary edema. Electronically Signed   By: Prudencio Pair M.D.   On: 07/04/2019 15:41   Dg Chest Port 1 View  Result Date: 07/03/2019 CLINICAL DATA:  Shortness of breath EXAM: PORTABLE CHEST 1 VIEW COMPARISON:  09/15/2017 FINDINGS: Grossly stable cardiomediastinal contours. Calcific aortic knob. There is pulmonary vascular congestion with diffuse airspace opacities bilaterally. No large pleural fluid collection. Patient positioning partially obscures the lung apices. No pneumothorax identified within these limitations. IMPRESSION: Diffuse bilateral airspace opacities which may reflect pulmonary alveolar edema versus multifocal pneumonia. Electronically Signed   By: Davina Poke M.D.   On: 07/03/2019 17:33    ASSESSMENT AND PLAN: 1.  ITP 2.  Normocytic anemia 3.  Acute hypoxic respiratory failure secondary to CHF exacerbation versus pneumonia 4.  Stage IV CKD 5.  Diabetes mellitus  -Thrombocytopenia like related to acute infection and underlying ITP.  Platelet count is stable at 56,000 today.  She is receiving Nplate.  She does not currently have any active bleeding.  Recommend continued monitoring.  Transfuse platelets for platelet count less than 10,000 or active bleeding. -Hemoglobin slowly drifting  down as 7.6 today.  IV iron has been recommended, but in light of acute infection, is being held for now.  Anemia also related to CKD.  Recommend PRBC transfusion for hemoglobin less than 7. -Continue management of acute respiratory failure per PCCM and primary  team.   LOS: 3 days   Mikey Bussing, DNP, AGPCNP-BC, AOCNP 07/06/19   ADDENDUM: I agree with the above note.  She has a Klebsiella urinary tract infection.  So far, have not seen any a blood infection.  She is still on the BiPAP.  Hopefully, her pulmonary status will improve.  Is possible she is going need to be transfused.  With her renal failure, I am sure that she has a very low erythropoietin level.  I know that she is getting outstanding care by everybody up on 3 E.  Hopefully, she will be able to pull through this.  [name], MD  [place] 23:25

## 2019-07-06 NOTE — Progress Notes (Signed)
Progress Note  Patient Name: [name] Date of Encounter: 07/06/2019  Primary Cardiologist:   [name], MD   Subjective   Somonlent.  No pain.    Inpatient Medications    Scheduled Meds: . albuterol  4 puff Inhalation Once  . apixaban  2.5 mg Oral BID  . atorvastatin  40 mg Oral QHS  . cholecalciferol  2,000 Units Oral Daily  . diltiazem  180 mg Oral QPM  . fenofibrate  54 mg Oral Daily  . furosemide  40 mg Intravenous Q6H  . [START ON 07/07/2019] influenza vaccine adjuvanted  0.5 mL Intramuscular Tomorrow-1000  . insulin aspart  0-20 Units Subcutaneous TID WC  . insulin aspart  0-5 Units Subcutaneous QHS  . insulin glargine  40 Units Subcutaneous QHS  . ipratropium-albuterol  3 mL Nebulization Q6H  . levothyroxine  112 mcg Oral Daily  . mouth rinse  15 mL Mouth Rinse q12n4p  . methylPREDNISolone (SOLU-MEDROL) injection  40 mg Intravenous Q12H  . multivitamin with minerals  1 tablet Oral Daily  . pantoprazole (PROTONIX) IV  40 mg Intravenous Q24H  . potassium chloride  40 mEq Oral Once  . sodium chloride flush  3 mL Intravenous Q12H  . vitamin C  250 mg Oral Daily   Continuous Infusions: . ceFEPime (MAXIPIME) IV Stopped (07/05/19 2015)  . doxycycline (VIBRAMYCIN) IV 100 mg (07/05/19 2353)  . vancomycin 250 mL/hr at 07/05/19 2332   PRN Meds: acetaminophen **OR** acetaminophen   Vital Signs    Vitals:   07/06/19 0321 07/06/19 0635 07/06/19 0700 07/06/19 0757  BP:  (!) 139/45 (!) 140/45   Pulse:  64 64   Resp:  20 (!) 23   Temp:  98.1 F (36.7 C) 98.5 F (36.9 C)   TempSrc:  Oral Oral   SpO2: 95% 98% 98% 96%  Weight:  111.2 kg    Height:  5\' 1"  (1.549 m)      Intake/Output Summary (Last 24 hours) at 07/06/2019 0819 Last data filed at 07/06/2019 0521 Gross per 24 hour  Intake 703.33 ml  Output 1600 ml  Net -896.67 ml   Filed Weights   07/03/19 1722 07/06/19 0635  Weight: 113.4 kg 111.2 kg    Telemetry    NSR - Personally Reviewed   ECG    NA - Personally Reviewed  Physical Exam   GEN: No acute distress.  On BiPap Neck: No  JVD Cardiac: RRR, no murmurs, rubs, or gallops.  Respiratory:   Decrease BS with diffuse crackles GI: Soft, nontender, non-distended  MS:   Mild thigh edema; No deformity. Neuro:  Nonfocal  Psych: Normal affect   Labs    Chemistry Recent Labs  Lab 07/03/19 1720  07/04/19 0422 07/04/19 1536 07/05/19 0224 07/06/19 0534  NA 142   < > 143 140 143 144  K 3.8   < > 4.0 4.4 4.3 4.4  CL 99   < > 104  --  101 103  CO2 30  --  28  --  30 30  GLUCOSE 207*   < > 188*  --  222* 283*  BUN 39*   < > 42*  --  53* 61*  CREATININE 2.59*   < > 2.56*  --  2.68* 2.57*  CALCIUM 9.3  --  8.2*  --  8.9 8.9  PROT 8.1  --   --   --   --   --   ALBUMIN 3.8  --   --   --   --   --  AST 33  --   --   --   --   --   ALT 30  --   --   --   --   --   ALKPHOS 67  --   --   --   --   --   BILITOT 0.8  --   --   --   --   --   GFRNONAA 17*  --  17*  --  16* 17*  GFRAA 19*  --  20*  --  19* 20*  ANIONGAP 13  --  11  --  12 11   < > = values in this interval not displayed.     Hematology Recent Labs  Lab 07/04/19 1226 07/04/19 1536 07/05/19 0224 07/06/19 0534  WBC 11.8*  --  8.9 9.9  RBC 2.95*  --  2.86*  2.86* 2.65*  HGB 8.6* 8.2* 8.4* 7.6*  HCT 28.0* 24.0* 27.1* 25.2*  MCV 94.9  --  94.8 95.1  MCH 29.2  --  29.4 28.7  MCHC 30.7  --  31.0 30.2  RDW 18.4*  --  18.4* 18.3*  PLT 54*  --  46* 56*    Cardiac EnzymesNo results for input(s): TROPONINI in the last 168 hours. No results for input(s): TROPIPOC in the last 168 hours.   BNP Recent Labs  Lab 07/03/19 1720  BNP 285.2*     DDimer No results for input(s): DDIMER in the last 168 hours.   Radiology    Ct Chest Wo Contrast  Result Date: 07/04/2019 CLINICAL DATA:  Acute respiratory illness, patient on BiPAP EXAM: CT CHEST WITHOUT CONTRAST TECHNIQUE: Multidetector CT imaging of the chest was performed following the standard protocol  without IV contrast. COMPARISON:  Radiograph 07/04/2019, CT chest 09/15/2017 FINDINGS: Cardiovascular: Mild cardiomegaly with lipomatous hypertrophy of the intra-atrial septum and biatrial enlargement. No pericardial effusion. Coronary artery calcifications are noted. Calcifications noted at the aortic root. Additional calcifications seen throughout the aortic arch and proximal great vessels with a normal caliber aorta and 3 vessel branching pattern. There is central pulmonary artery enlargement. Luminal evaluation is precluded in the absence of contrast media. Major venous structures are unremarkable. Mediastinum/Nodes: Stable appearance of a partially calcified right paraesophageal soft tissue density, 2.1 cm in size (axial 3 image 9). Shotty mediastinal adenopathy is similar to comparison exam without enlarged mediastinal or axillary adenopathy. Hilar lymph nodes are difficult to assess in the absence of contrast media. Lungs/Pleura: Diffuse airways thickening and scattered secretions. Widespread areas of ground-glass are present throughout both lungs. Such findings are superimposed on the more chronic subpleural reticular changes seen on comparison exam. Unable to visualize a right apical nodule seen on comparison due to the underlying parenchymal disease. There is a stable appearance of the 9 mm nodule in the right middle lobe. Scattered subpleural nodules seen on comparison are also difficult to visualize. Few punctate calcifications in both lungs likely reflect calcified granulomata. There are small bilateral effusions, right slightly greater than left. Fluid is also seen tracking along the fissures. Apical emphysematous changes are again noted. Upper Abdomen: Mild thickening of the left adrenal gland with surrounding hazy stranding, new from prior study. Finding may reflect adrenal congestion or adjacent process incompletely imaged on this study. Nodular thickening of the right adrenal gland is similar to  prior. There is partial fatty replacement of the pancreas. Splenomegaly is noted as well. Musculoskeletal: No acute osseous abnormality or suspicious osseous lesion. Multilevel degenerative changes are  present in the imaged portions of the spine. Additional degenerative changes noted in both shoulders. IMPRESSION: 1. Widespread areas of ground-glass throughout both lungs, with associated diffuse airways thickening and scattered secretions, such findings could reflect ARDS, infection including atypical etiologies or severe widespread interstitial and alveolar edema. 2. Small bilateral effusions, right slightly greater than left. 3. Cardiomegaly with lipomatous hypertrophy of the intra-atrial septum and biatrial enlargement. 4. Enlargement of the main pulmonary artery, suggestive of pulmonary arterial hypertension. 5. Mild thickening of the left adrenal gland with surrounding hazy stranding, new from prior study. Finding may reflect adrenal congestion or adjacent process incompletely imaged on this study. 6. Aortic Atherosclerosis (ICD10-I70.0) and Emphysema (ICD10-J43.9). Electronically Signed   By: Lovena Le M.D.   On: 07/04/2019 23:45   Dg Chest Port 1 View  Result Date: 07/04/2019 CLINICAL DATA:  Acute respiratory failure EXAM: PORTABLE CHEST 1 VIEW COMPARISON:  July 03, 2019 FINDINGS: There is mild cardiomegaly. Aortic knob calcifications. There is hazy/fluffy airspace opacities seen throughout both lungs. This appears unchanged since the prior exam. No acute osseous abnormality. IMPRESSION: Bilateral airspace opacities as on the prior exam. This could be due to multifocal pneumonia, ARDS, and/or pulmonary edema. Electronically Signed   By: Prudencio Pair M.D.   On: 07/04/2019 15:41    Cardiac Studies   ECHO:  07/06/19  1. Left ventricular ejection fraction, by visual estimation, is 50 to 55%. The left ventricle has normal function. There is no left ventricular hypertrophy. 2. Elevated left  ventricular end-diastolic pressure. 3. Left ventricular diastolic parameters are consistent with Grade I diastolic dysfunction (impaired relaxation). 4. Global right ventricle has normal systolic function.The right ventricular size is normal. No increase in right ventricular wall thickness. 5. Left atrial size was mildly dilated. 6. Right atrial size was normal. 7. Small pericardial effusion. 8. The mitral valve is normal in structure. Trace mitral valve regurgitation. No evidence of mitral stenosis. 9. The tricuspid valve is normal in structure. Tricuspid valve regurgitation is mild. 10. The aortic valve is normal in structure. Aortic valve regurgitation is trivial. No evidence of aortic valve sclerosis or stenosis. 11. There is Mild calcification of the aortic valve. 12. There is Mild thickening of the aortic valve. 13. The pulmonic valve was normal in structure. Pulmonic valve regurgitation is not visualized. 14. Moderately elevated pulmonary artery systolic pressure. 15. The inferior vena cava is dilated in size with <50% respiratory variability, suggesting right atrial pressure of 15 mmHg.  Patient Profile     84 y.o. female with a hx of PAF, HL, DM, HTN, hypothyroidism, CHF, ITP  who is being seen for the evaluation of elevated troponin at the request of Dr. Dareen Piano.  Assessment & Plan    Elevated Troponin:   I suspect demand ischemia.  Low normal EF as above.  No further ischemia work up.    Acute hypoxic respiratory failure: felt to be related to multifocal PNA ( possibly viral) as well as HF. See below.  Continue therapy per CCM and primary team.   PAF:   SR on admission.  She is tolerating Eliquis.      Chronic diastolic HF:   Difficult to assess volume status.  Tolerating IV Lasix.  Probably today only and I will decrease to IV.  Probably convert to PO in the AM  Thrombocytopenia: Followed by Dr. Marin Olp.   HL:    Continue statin.   CKD IV:   Creat is stable.     Anemia:   Treated  with FE.  Plans per primary.  Chronic and no active signs of bleeding.     For questions or updates, please contact [place] Please consult [email or web address] for contact info under Cardiology/STEMI.   Signed, Minus Breeding, MD  07/06/2019, 8:19 AM

## 2019-07-06 NOTE — Progress Notes (Signed)
NAME:  [name], MRN:  [id number], DOB:  Oct 18, 1934, LOS: 3 ADMISSION DATE:  07/03/2019, CONSULTATION DATE:  10/28 REFERRING MD:  Dr. Charleen Kirks FPTS, CHIEF COMPLAINT:  Prolonged respiratory failure   Brief History   84yo F with extensive past medical history presented SOB with increased oxygen demands, requiring initiation of BIPAP therapy. Patient has been unable to wean from NIV therapy over the last 36hrs, PCCM consulted for further management.   History of present illness   [name] is an 84yo female with extensive past medical history including OSA, chronic hypoxic  respiratory failure, CKD, and diastolic congestive heart failure who presented for local ALF with progressive shortness of breath requiring initiation of BIPAP therapy and has been unable to wean NIV since arrival 36hrs ago.   Patient underwent successful cardioversion 10/19 and was unable to wean from supplemental oxygen at time of discharge. She was discharged back to ALF with supplemental oxygen and has had progressive need for higher levels of oxygen since discharge. Per daughter patient was diagnosed with viral pneumonia at ALF but when dyspnea worsened patient was brought to ED. Daughter reported increased lower extremity edema prior to admission. Denied any fever, chills, weight changes, chest pain, cough, nausea/vomiting, or urinary symptoms.   On arrival patient was seen with increased work of breathing, diaphoretic, with shortness of breath requiring application of BiPAP. CT chest with widespread ground-glass throughout both lungs, small bilateral effusions, cardiomegaly with enlargement of main pulmonary artery. Also noted progressive incline of troponin since arrival.   PCCM consulted for further management.  Past Medical History  Hypertension Type 2 diabetes Syncope Chronic hypoxic respiratory failure OSA on CPAP Obesity  Asthma  Depression Anxiety  Significant Hospital Events   10/26 Admitted   Consults:  PCCM  Procedures:    Significant Diagnostic Tests:  CT Chest 10/27 >> widespread ground-glass throughout both lungs, small bilateral effusions, cardiomegaly with enlargement of main pulmonary artery.  Micro Data:  COVID 10/26 >> neg Urine culture 10/26 >> Klebsilla pna, resistant to ampicillin Blood culture   Antimicrobials:  10/26 Cefepime >> 10/26 Vancomycin >> Doxycycline 10/28 >>  Interim history/subjective:  No acute change.  Did not tolerate trial off bipap although daughter says that was r/t the HFNC making her nose too dry/stuffy.    Objective   Blood pressure (!) 142/47, pulse 64, temperature 98.9 F (37.2 C), resp. rate 20, height 5\' 1"  (1.549 m), weight 111.2 kg, SpO2 96 %.    FiO2 (%):  [70 %-80 %] 70 %   Intake/Output Summary (Last 24 hours) at 07/06/2019 1150 Last data filed at 07/06/2019 7867 Gross per 24 hour  Intake 703.33 ml  Output 1600 ml  Net -896.67 ml   Filed Weights   07/03/19 1722 07/06/19 0635  Weight: 113.4 kg 111.2 kg    Examination: General: Chronically ill appearing elderly obese female, in NAD  HEENT: BIPAP in place MM pink/dry, PERRL,  Neuro: drowsy but easily wakes, oriented, able to follow commands and answer all questions, no focal deficits  CV: s1s2 regular rate and rhythm, no murmur, rubs, or gallops,  PULM:  resps even non labored on bipap, diminished throughout, no increased WOB  GI: soft, bowel sounds active in all 4 quadrants, non-tender, non-distended Extremities: warm/dry, generalized edema  Skin: no rashes or lesions  Resolved Hospital Problem list     Assessment & Plan:   Acute on chronic hypoxic and hypercapnic respiratory failure History of OSA compliant with CPAP Suspect multifactorial in  setting ?atypical PNA and decompensated HF +/- ?NSTEMI  P: Continue supplemental oxygen, continue attempts at transitioning off bipap but continue qhs bipap  Empiric IV steroids  Continue broad spectrum  antibiotics with low threshold to discontinue  Aggressively diurese as renal function will allow - cards following  Continue bronchodilators  Strict intake and output Echo pending  Family currently discussion intubation for goals of care - suspect not great candidate for intubation   Elevated troponin  Acute on chronic diastolic congestive heart failure History of A-fib  -Presented with a troponin of 14 with progressive rise with peak of 520 10/27. Cards feels r/t demand ischemia however it appears she has remained in sinus rhythm with no sign of tachycardia, hypertensin / hypertensive to produce demand ischemia  P: Cards following  Echo as above  Holding anticoag for now  Gentle diuresis  Trend renal function   Best practice:  Diet: NPO while on BIPAP Pain/Anxiety/Delirium protocol (if indicated): N/A VAP protocol (if indicated): N/A DVT prophylaxis: SCD GI prophylaxis: PPI Glucose control: SSI Mobility: Per Primary  Code Status: LCM, intubation only  Family Communication: Daughter updated at bedside 10/29 Disposition: Per Primary   Labs   CBC: Recent Labs  Lab 06/30/19 1400 07/03/19 1720  07/04/19 0422 07/04/19 1226 07/04/19 1536 07/05/19 0224 07/06/19 0534  WBC 6.4 12.6*  --  10.8* 11.8*  --  8.9 9.9  NEUTROABS 5.2 9.3*  --   --   --   --  7.3 9.2*  HGB 9.3* 10.1*   < > 8.1* 8.6* 8.2* 8.4* 7.6*  HCT 29.9* 32.8*   < > 27.0* 28.0* 24.0* 27.1* 25.2*  MCV 94.3 94.0  --  97.1 94.9  --  94.8 95.1  PLT 155 64*  --  46* 54*  --  46* 56*   < > = values in this interval not displayed.    Basic Metabolic Panel: Recent Labs  Lab 07/03/19 1720 07/03/19 1735 07/03/19 2219 07/04/19 0422 07/04/19 1536 07/05/19 0224 07/06/19 0534  NA 142 140  140  --  143 140 143 144  K 3.8 3.8  3.8  --  4.0 4.4 4.3 4.4  CL 99 98  --  104  --  101 103  CO2 30  --   --  28  --  30 30  GLUCOSE 207* 209*  --  188*  --  222* 283*  BUN 39* 39*  --  42*  --  53* 61*  CREATININE 2.59*  2.60*  --  2.56*  --  2.68* 2.57*  CALCIUM 9.3  --   --  8.2*  --  8.9 8.9  MG  --   --  2.1 2.0  --   --  2.3   GFR: Estimated Creatinine Clearance: 19.8 mL/min (A) (by C-G formula based on SCr of 2.57 mg/dL (H)). Recent Labs  Lab 07/03/19 1720 07/03/19 2020 07/04/19 0422 07/04/19 1226 07/04/19 1521 07/05/19 0224 07/06/19 0534  PROCALCITON  --   --   --   --  4.65 4.77 3.05  WBC 12.6*  --  10.8* 11.8*  --  8.9 9.9  LATICACIDVEN 1.9 1.3  --   --   --   --   --     Liver Function Tests: Recent Labs  Lab 07/03/19 1720  AST 33  ALT 30  ALKPHOS 67  BILITOT 0.8  PROT 8.1  ALBUMIN 3.8   No results for input(s): LIPASE, AMYLASE in the last 168  hours. No results for input(s): AMMONIA in the last 168 hours.  ABG    Component Value Date/Time   PHART 7.433 07/04/2019 1536   PCO2ART 50.4 (H) 07/04/2019 1536   PO2ART 75.0 (L) 07/04/2019 1536   HCO3 33.7 (H) 07/04/2019 1536   TCO2 35 (H) 07/04/2019 1536   O2SAT 95.0 07/04/2019 1536     Coagulation Profile: Recent Labs  Lab 07/03/19 1831  INR 1.4*    Cardiac Enzymes: No results for input(s): CKTOTAL, CKMB, CKMBINDEX, TROPONINI in the last 168 hours.  HbA1C: Hemoglobin A1C  Date/Time Value Ref Range Status  02/22/2019 11:20 AM 6.3 (A) 4.0 - 5.6 % Final  09/08/2018 10:06 AM 7.3 (A) 4.0 - 5.6 % Final   Hemoglobin A1c  Date/Time Value Ref Range Status  08/19/2017 02:00 PM 7.1 (H) 4.8 - 5.6 % Final    Comment:             Prediabetes: 5.7 - 6.4          Diabetes: >6.4          Glycemic control for adults with diabetes: <7.0    Hgb A1c MFr Bld  Date/Time Value Ref Range Status  12/15/2016 08:53 AM 7.0 (H) 4.6 - 6.5 % Final    Comment:    Glycemic Control Guidelines for People with Diabetes:Non Diabetic:  <6%Goal of Therapy: <7%Additional Action Suggested:  >8%   09/11/2015 10:14 AM 8.3 (H) 4.6 - 6.5 % Final    Comment:    Glycemic Control Guidelines for People with Diabetes:Non Diabetic:  <6%Goal of Therapy:  <7%Additional Action Suggested:  >8%     CBG: Recent Labs  Lab 07/05/19 1202 07/05/19 1831 07/05/19 1941 07/05/19 2336 07/06/19 0542  GLUCAP 209* 202* 216* 271* 266*     Signature:   Nickolas Madrid, NP 07/06/2019  11:50 AM Pager: [phone] or [phone]

## 2019-07-07 DIAGNOSIS — I480 Paroxysmal atrial fibrillation: Secondary | ICD-10-CM | POA: Diagnosis not present

## 2019-07-07 DIAGNOSIS — J9601 Acute respiratory failure with hypoxia: Secondary | ICD-10-CM | POA: Diagnosis not present

## 2019-07-07 DIAGNOSIS — D509 Iron deficiency anemia, unspecified: Secondary | ICD-10-CM | POA: Diagnosis not present

## 2019-07-07 DIAGNOSIS — Z9889 Other specified postprocedural states: Secondary | ICD-10-CM

## 2019-07-07 DIAGNOSIS — R7989 Other specified abnormal findings of blood chemistry: Secondary | ICD-10-CM

## 2019-07-07 DIAGNOSIS — R0602 Shortness of breath: Secondary | ICD-10-CM | POA: Diagnosis not present

## 2019-07-07 DIAGNOSIS — I5032 Chronic diastolic (congestive) heart failure: Secondary | ICD-10-CM

## 2019-07-07 DIAGNOSIS — R5383 Other fatigue: Secondary | ICD-10-CM | POA: Diagnosis not present

## 2019-07-07 DIAGNOSIS — D693 Immune thrombocytopenic purpura: Secondary | ICD-10-CM | POA: Diagnosis not present

## 2019-07-07 LAB — BASIC METABOLIC PANEL
Anion gap: 12 (ref 5–15)
BUN: 81 mg/dL — ABNORMAL HIGH (ref 8–23)
CO2: 30 mmol/L (ref 22–32)
Calcium: 9.4 mg/dL (ref 8.9–10.3)
Chloride: 104 mmol/L (ref 98–111)
Creatinine, Ser: 2.69 mg/dL — ABNORMAL HIGH (ref 0.44–1.00)
GFR calc Af Amer: 18 mL/min — ABNORMAL LOW (ref >60)
GFR calc non Af Amer: 16 mL/min — ABNORMAL LOW (ref >60)
Glucose, Bld: 212 mg/dL — ABNORMAL HIGH (ref 70–99)
Potassium: 4.2 mmol/L (ref 3.5–5.1)
Sodium: 146 mmol/L — ABNORMAL HIGH (ref 135–145)

## 2019-07-07 LAB — CBC WITH DIFFERENTIAL/PLATELET
Abs Immature Granulocytes: 0.15 10*3/uL — ABNORMAL HIGH (ref 0.00–0.07)
Basophils Absolute: 0 10*3/uL (ref 0.0–0.1)
Basophils Relative: 0 %
Eosinophils Absolute: 0 10*3/uL (ref 0.0–0.5)
Eosinophils Relative: 0 %
HCT: 25.2 % — ABNORMAL LOW (ref 36.0–46.0)
Hemoglobin: 7.7 g/dL — ABNORMAL LOW (ref 12.0–15.0)
Immature Granulocytes: 2 %
Lymphocytes Relative: 4 %
Lymphs Abs: 0.4 10*3/uL — ABNORMAL LOW (ref 0.7–4.0)
MCH: 28.7 pg (ref 26.0–34.0)
MCHC: 30.6 g/dL (ref 30.0–36.0)
MCV: 94 fL (ref 80.0–100.0)
Monocytes Absolute: 0.4 10*3/uL (ref 0.1–1.0)
Monocytes Relative: 4 %
Neutro Abs: 8.5 10*3/uL — ABNORMAL HIGH (ref 1.7–7.7)
Neutrophils Relative %: 90 %
Platelets: 87 10*3/uL — ABNORMAL LOW (ref 150–400)
RBC: 2.68 MIL/uL — ABNORMAL LOW (ref 3.87–5.11)
RDW: 18.1 % — ABNORMAL HIGH (ref 11.5–15.5)
WBC: 9.5 10*3/uL (ref 4.0–10.5)
nRBC: 0 % (ref 0.0–0.2)

## 2019-07-07 LAB — GLUCOSE, CAPILLARY
Glucose-Capillary: 254 mg/dL — ABNORMAL HIGH (ref 70–99)
Glucose-Capillary: 311 mg/dL — ABNORMAL HIGH (ref 70–99)
Glucose-Capillary: 225 mg/dL — ABNORMAL HIGH (ref 70–99)
Glucose-Capillary: 150 mg/dL — ABNORMAL HIGH (ref 70–99)

## 2019-07-07 LAB — CBC
HCT: 28.3 % — ABNORMAL LOW (ref 36.0–46.0)
Hemoglobin: 8.8 g/dL — ABNORMAL LOW (ref 12.0–15.0)
MCH: 28.9 pg (ref 26.0–34.0)
MCHC: 31.1 g/dL (ref 30.0–36.0)
MCV: 93.1 fL (ref 80.0–100.0)
Platelets: 99 10*3/uL — ABNORMAL LOW (ref 150–400)
RBC: 3.04 MIL/uL — ABNORMAL LOW (ref 3.87–5.11)
RDW: 18 % — ABNORMAL HIGH (ref 11.5–15.5)
WBC: 10.1 10*3/uL (ref 4.0–10.5)
nRBC: 0 % (ref 0.0–0.2)

## 2019-07-07 LAB — SEDIMENTATION RATE: Sed Rate: 120 mm/hr — ABNORMAL HIGH (ref 0–22)

## 2019-07-07 LAB — PREPARE RBC (CROSSMATCH)

## 2019-07-07 LAB — MAGNESIUM: Magnesium: 2.6 mg/dL — ABNORMAL HIGH (ref 1.7–2.4)

## 2019-07-07 MED ORDER — SODIUM CHLORIDE 0.9% IV SOLUTION
Freq: Once | INTRAVENOUS | Status: AC
  Administered 2019-07-07: 16:00:00 via INTRAVENOUS

## 2019-07-07 MED ORDER — FUROSEMIDE 40 MG PO TABS
40.0000 mg | ORAL_TABLET | Freq: Every day | ORAL | Status: DC
  Administered 2019-07-08: 09:00:00 40 mg via ORAL
  Filled 2019-07-07: qty 1

## 2019-07-07 MED ORDER — APIXABAN 5 MG PO TABS
5.0000 mg | ORAL_TABLET | Freq: Two times a day (BID) | ORAL | Status: DC
  Administered 2019-07-07: 5 mg via ORAL
  Filled 2019-07-07: qty 1

## 2019-07-07 MED ORDER — IPRATROPIUM-ALBUTEROL 0.5-2.5 (3) MG/3ML IN SOLN
3.0000 mL | Freq: Three times a day (TID) | RESPIRATORY_TRACT | Status: DC
  Administered 2019-07-07 – 2019-07-18 (×35): 3 mL via RESPIRATORY_TRACT
  Filled 2019-07-07 (×35): qty 3

## 2019-07-07 MED ORDER — SERTRALINE HCL 100 MG PO TABS
100.0000 mg | ORAL_TABLET | Freq: Every evening | ORAL | Status: DC
  Administered 2019-07-07 – 2019-07-18 (×12): 100 mg via ORAL
  Filled 2019-07-07 (×12): qty 1

## 2019-07-07 MED ORDER — FUROSEMIDE 10 MG/ML IJ SOLN: 40.0000 mg | Freq: Every day | INTRAMUSCULAR | Status: DC

## 2019-07-07 MED ORDER — FUROSEMIDE 10 MG/ML IJ SOLN
40.0000 mg | Freq: Once | INTRAMUSCULAR | Status: AC
  Administered 2019-07-07: 40 mg via INTRAVENOUS
  Filled 2019-07-07: qty 4

## 2019-07-07 MED ORDER — INSULIN GLARGINE 100 UNIT/ML ~~LOC~~ SOLN
60.0000 [IU] | Freq: Every day | SUBCUTANEOUS | Status: DC
  Administered 2019-07-07 – 2019-07-12 (×6): 60 [IU] via SUBCUTANEOUS
  Filled 2019-07-07 (×8): qty 0.6

## 2019-07-07 NOTE — Care Management Important Message (Signed)
Important Message  Patient Details  Name: BERENICE OEHLERT MRN: [id number] Date of Birth: Jan 07, 1935   Medicare Important Message Given:  Yes     Shelda Altes 07/07/2019, 12:55 PM

## 2019-07-07 NOTE — Plan of Care (Signed)

## 2019-07-07 NOTE — Progress Notes (Signed)
Subjective:   [name] is wearing the Bipap on bedside rounds today. She notes improvement in SOB and denies any acute pain at this time. Her daughter is at bedside. Daughter notes one episode of coughing up sputum that was dark red. She had another one later in the morning that daughter was able to show me. She also worries that since [name] spends a lot of time napping and not moving around, that it is negatively effecting her lung status.   We discussed continuation of antibiotics, steroids and slowing down Lasix. We also emphasized again that the Bipap is not a long term option and its important to work on weaning as much as tolerated. All questions and concerns addressed.   Objective:  Vital signs in last 24 hours: Vitals:   07/07/19 0850 07/07/19 1145 07/07/19 1226 07/07/19 1327  BP:  (!) 130/51 (!) 135/49   Pulse: 62 63 (!) 57 (!) 55  Resp:  20 20 20   Temp:  97.7 F (36.5 C)    TempSrc:  Oral    SpO2: 99% 94% 95% 94%  Weight:      Height:       Physical Exam Vitals signs and nursing note reviewed.  Constitutional:      General: She is not in acute distress.    Appearance: She is obese.  Neck:     Vascular: No JVD.  Pulmonary:     Effort: Tachypnea and accessory muscle usage present. No respiratory distress.     Breath sounds: Examination of the right-middle field reveals decreased breath sounds. Examination of the left-middle field reveals decreased breath sounds. Examination of the right-lower field reveals decreased breath sounds. Examination of the left-lower field reveals decreased breath sounds. Decreased breath sounds (Unchanged from prior exam) present. No wheezing, rhonchi or rales.  Abdominal:     General: Bowel sounds are normal.     Palpations: Abdomen is soft.     Tenderness: There is no abdominal tenderness. There is no guarding.  Musculoskeletal:     Right lower leg: No edema.     Left lower leg: No edema.  Skin:    General: Skin is warm and dry.      Comments: Signs of venous stasis in bilateral lower extremities,chronic. Unchanged examination from prior.   Neurological:     Mental Status: She is alert and oriented to person, place, and time.  Psychiatric:        Mood and Affect: Mood normal. Mood is not anxious.        Behavior: Behavior normal.    Assessment/Plan:  Active Problems:   Acute hypoxemic respiratory failure ([place])   HCAP (healthcare-associated pneumonia)  [name] is a 84 y/o female with PMHx of HTN, CKD IV, CHF, OSA and A.fib presenting with sudden onset dyspnea in setting of increased O2 requirement for 1 week after cardioversion. CXR concerning for pulmonary edema vs bilateral pneumonia. Initial ABG with respiratory acidosis.   # Acute hypoxic respiratory failure Patient has a history of HFpEF (Echo 01/2017 with EF 65-70%). She is on an alternating schedule of 40mg  and 20mg  of Lasix daily and reports compliance prior to admission.  Initial chest xray at the ED (10/26) showed diffuse bilateral airspace opacities, likely edema vs pneumonia. CT on 10/26 showed widespread ground glass opacities bilaterally with airway thickening and scattered secretions, possibly ARDS vs infection vs edema.   She is on day 5 of broad spectrum antibiotics: Vancomycin, Cefepime and Doxycycline. Pharmacy recommends  narrowing today and so will discontinue Vancomycin as unlikely to be MRSA. Her respiratory status is largely unchanged; she is still using the Bipap throughout the majority of the day with a FiO2 between 50-70%. She has difficult switching to HFNC due to a mixture of anxiety with gradual increased work of breathing. Developing minimal hemoptysis, which is not completely surprising given the severity of her respiratory status. Informed nurse and daughter to call MD if becomes more in quantity or frequency. Also informed Dr. Marin Olp, who recommend holding Elliquis for now.   - PCCM on board - Continue BiPAP and wean as  tolerated - IV Lasix 40mg  BID - Continue Cefepime and Doxycycline  - Solumedrol 40mg  IV BID per PCCM - Discontinue Vancomycin  - Duoneb 50ml q4h  - Strict I/O's and daily weights  # Normocytic Anemia:  Iron studies show normal ferritin but low iron levels and iron saturation. TIBC is normal but on the higher end. Holding IV Feraheme until resolution of acute infection. Her hemoglobin is downtrending and given that she may be hemoconcentrated with high doses of Lasix, true hemoglobin could be below 7. Due to this, Dr. Marin Olp has ordered 1 unit of pRBCs. Due to concern of fluid overload, will be given with Lasix.   - CBC daily - Transfuse 1 unit of pRBCs. Repeat H&H afterwards per RN.   # QTc Prolongation:  Patient with recent cardioversion for atypical atrial flutter. Her initial EKG on presentation with QTc 496. Subsequent EKG showed worsening to 595 but improved as of last night.   At this time, continue home Diltiazem and avoid QTc prolonging agents as much as possible.  - Cardiology on board and we appreciate their recommendations  - Daily EKGs - Cardiac monitoring - Diltiazem 180mg  qd - Avoid QT prolonging agents   # Hypertension:  Patient with history of hypertension and initially presented with SBP >200. She was placed on nitro gtt in the ED and BP responded well. Since then, her BP remains in the average 235-573U systolic. No changes to medication at this time.   - Diltiazem 180mg  qd  # Thrombocytopenia # Chronic ITP Platelets were 64 on admission and remained stable since. Patient has a history of chronic ITP for which she receives weekly N-plate infusions and last one was on 10/23 in the outpatient setting. Dr. Marin Olp, her primary oncologist, has ordered N-plate (Romiplostim) to be administered on 10.27.  - Oncology is following and we appreciate their recommendations.  - CBC daily   # CKD Stage IV:  Stable.   - Avoid nephrotoxic agents - BMP in the A.M.   #  Diabetes mellitus:  Patient with history of DMII with insulin requirement. Her PCP left a note recently with her home regimen, which is notably quite extensive. She may require titration up as her sugars are in the mid 200s consistently. Will increase Lantus to 60 units at bedtime.   - CBG monitoring - Lantus 60U at bedtime - Resistant SSI   # Klebsiella UTI Urine cultures drawn on admission are currently growing Klebsiella that is sensitive to everything but Ampicillin. She is not currently complaining of any urinary symptoms but she is on multiple antibiotics for acute respiratory failure, so it is likely still getting treated. This could reflect a chronic colonization instead of an acute issue, as her last urine cultures were back in 2017.   # Troponinemia: Resolving Likely secondary to demand ischemia in setting of hypertensive urgency and respiratory failure on presentation. Cardiology  consulted and concurred with. No ischemic work up planned.     Dispo: Anticipated discharge pending clinical improvement.    Dr. Jose Persia Internal Medicine PGY-1  Pager: [phone] 07/07/2019, 1:59 PM

## 2019-07-07 NOTE — Progress Notes (Signed)
Internal Medicine Attending:   I saw and examined the patient. I reviewed the resident's note and I agree with the resident's findings and plan as documented in the resident's note.  Patient states her shortness of breath is slowly improving.  Patient did have one episode of coughing up reddish phlegm.  Patient initially made to the hospital with acute hypoxic respiratory failure.  The etiology of respiratory failure is likely secondary to an underlying infectious etiology.  We will continue cefepime and doxycycline for now and discontinue vancomycin.  Patient still requiring BiPAP during the majority of the day with an FiO2 of 50 to 70%.  She has not been able to tolerate being off BiPAP for more than a couple of hours at a time.  Continue with Solu-Medrol 40 mg twice daily for now.  Patient does have minimal hemoptysis at this time likely secondary to her underlying infection.  We will continue to hold her Eliquis for now.  Pulmonary to follow-up today.  We will follow-up recommendations.  Patient has a history of chronic diastolic heart failure but it is unlikely that her current respiratory status is secondary to heart failure exacerbation.  Patient has been adequately diuresed and we will transition her to oral Lasix in the morning.  Cardiology follow-up and recommendations appreciated.  Patient's QTC is improving now down to 496.  We will continue to monitor.  Avoid QTC prolonging medications for now.  Patient also has a history of ITP and hematology is following her while she is here.  Patient's platelet count continues to improve and is up to 87 today.  Patient also noted to be anemic with hemoglobin of 7.7 which is down from 10.3 on admission.  Hematology ordered 1 unit PRBC to be transfused today.  We will continue to monitor closely.

## 2019-07-07 NOTE — Progress Notes (Signed)
Inpatient Diabetes Program Recommendations  AACE/ADA: New Consensus Statement on Inpatient Glycemic Control   Target Ranges:  Prepandial:   less than 140 mg/dL      Peak postprandial:   less than 180 mg/dL (1-2 hours)      Critically ill patients:  140 - 180 mg/dL   Results for [name], ADELSBERGER (MRN [id number]) as of 07/07/2019 13:27  Ref. Range 07/06/2019 05:42 07/06/2019 12:38 07/06/2019 17:31 07/06/2019 21:58 07/07/2019 06:24 07/07/2019 12:23  Glucose-Capillary Latest Ref Range: 70 - 99 mg/dL 266 (H) 244 (H) 270 (H) 274 (H) 254 (H) 311 (H)   Review of Glycemic Control  Diabetes history: DM2 Outpatient Diabetes medications: Trulicity 1.5 mg Qweek (Sunday), Lantus 40 units BID, Humalog 35 units with breakfast, Humalog 30 units with lunch, Humalog 25 units with supper, BGL >90 = give the full dose for a regular meal, 60-90 = 17 units before breakfast, 15 units before lunch, and 12 units before supper; hold if <60"  Current orders for Inpatient glycemic control: Lantus 50 units QHS, Novolog 0-20 units TID with meals, Novolog 0-5 units QHS; Solumedrol 40 mg Q12H  Inpatient Diabetes Program Recommendations:   Insulin-Basal: If steroids continued, please consider increased to Lantus 60 units QHS.  Insulin-Meal Coverage: If steroids are continued, please consider ordering Novolog  10 units TID with meals for meal coverage if patient eats at least 50% of meals.  Thanks, Barnie Alderman, RN, MSN, CDE Diabetes Coordinator Inpatient Diabetes Program [phone] (Team Pager from 8am to 5pm)

## 2019-07-07 NOTE — Progress Notes (Signed)
Progress Note  Patient Name: [name] Date of Encounter: 07/07/2019  Primary Cardiologist:   [name], MD   Subjective   Somnolent.  No pain  Inpatient Medications    Scheduled Meds: . sodium chloride   Intravenous Once  . apixaban  5 mg Oral BID  . atorvastatin  40 mg Oral QHS  . Chlorhexidine Gluconate Cloth  6 each Topical Daily  . cholecalciferol  2,000 Units Oral Daily  . diltiazem  180 mg Oral QPM  . fenofibrate  54 mg Oral Daily  . furosemide  40 mg Intravenous BID  . influenza vaccine adjuvanted  0.5 mL Intramuscular Tomorrow-1000  . insulin aspart  0-20 Units Subcutaneous TID WC  . insulin aspart  0-5 Units Subcutaneous QHS  . insulin glargine  50 Units Subcutaneous QHS  . ipratropium-albuterol  3 mL Nebulization TID  . levothyroxine  112 mcg Oral Daily  . mouth rinse  15 mL Mouth Rinse q12n4p  . methylPREDNISolone (SOLU-MEDROL) injection  40 mg Intravenous Q12H  . multivitamin with minerals  1 tablet Oral Daily  . mupirocin ointment  1 application Nasal BID  . pantoprazole (PROTONIX) IV  40 mg Intravenous Q24H  . potassium chloride  40 mEq Oral Once  . sertraline  100 mg Oral QPM  . sodium chloride flush  3 mL Intravenous Q12H  . vitamin C  250 mg Oral Daily   Continuous Infusions: . ceFEPime (MAXIPIME) IV 2 g (07/06/19 2031)  . doxycycline (VIBRAMYCIN) IV 100 mg (07/06/19 2251)  . vancomycin 250 mL/hr at 07/05/19 2332   PRN Meds: acetaminophen **OR** acetaminophen   Vital Signs    Vitals:   07/07/19 0614 07/07/19 0800 07/07/19 0837 07/07/19 0850  BP:   (!) 138/47   Pulse:  61 60 62  Resp:  (!) 22 (!) 22   Temp:   97.9 F (36.6 C)   TempSrc:   Oral   SpO2:  95% (!) 88% 99%  Weight: 111.5 kg     Height:        Intake/Output Summary (Last 24 hours) at 07/07/2019 1052 Last data filed at 07/07/2019 0400 Gross per 24 hour  Intake 650.39 ml  Output 1550 ml  Net -899.61 ml   Filed Weights   07/03/19 1722 07/06/19 0635 07/07/19  0614  Weight: 113.4 kg 111.2 kg 111.5 kg    Telemetry    NSR - Personally Reviewed  ECG    NA - Personally Reviewed  Physical Exam   GEN:   Chronically ill appearing Neck: No  JVD Cardiac: RRR, no murmurs, rubs, or gallops.  Respiratory:    Decreased breath sounds with diffuse crackles. \ GI: Soft, nontender, non-distended, normal bowel sounds  MS:  No edema; No deformity. Neuro:   Nonfocal  Psych: Oriented and appropriate    Labs    Chemistry Recent Labs  Lab 07/03/19 1720  07/05/19 0224 07/06/19 0534 07/07/19 0428  NA 142   < > 143 144 146*  K 3.8   < > 4.3 4.4 4.2  CL 99   < > 101 103 104  CO2 30   < > 30 30 30   GLUCOSE 207*   < > 222* 283* 212*  BUN 39*   < > 53* 61* 81*  CREATININE 2.59*   < > 2.68* 2.57* 2.69*  CALCIUM 9.3   < > 8.9 8.9 9.4  PROT 8.1  --   --   --   --  ALBUMIN 3.8  --   --   --   --   AST 33  --   --   --   --   ALT 30  --   --   --   --   ALKPHOS 67  --   --   --   --   BILITOT 0.8  --   --   --   --   GFRNONAA 17*   < > 16* 17* 16*  GFRAA 19*   < > 19* 20* 18*  ANIONGAP 13   < > 12 11 12    < > = values in this interval not displayed.     Hematology Recent Labs  Lab 07/06/19 0534 07/06/19 1555 07/07/19 0428  WBC 9.9 12.0* 9.5  RBC 2.65* 2.81* 2.68*  HGB 7.6* 8.1* 7.7*  HCT 25.2* 26.0* 25.2*  MCV 95.1 92.5 94.0  MCH 28.7 28.8 28.7  MCHC 30.2 31.2 30.6  RDW 18.3* 18.0* 18.1*  PLT 56* 76* 87*    Cardiac EnzymesNo results for input(s): TROPONINI in the last 168 hours. No results for input(s): TROPIPOC in the last 168 hours.   BNP Recent Labs  Lab 07/03/19 1720  BNP 285.2*     DDimer No results for input(s): DDIMER in the last 168 hours.   Radiology    No results found.  Cardiac Studies   ECHO:  07/06/19  1. Left ventricular ejection fraction, by visual estimation, is 50 to 55%. The left ventricle has normal function. There is no left ventricular hypertrophy. 2. Elevated left ventricular end-diastolic  pressure. 3. Left ventricular diastolic parameters are consistent with Grade I diastolic dysfunction (impaired relaxation). 4. Global right ventricle has normal systolic function.The right ventricular size is normal. No increase in right ventricular wall thickness. 5. Left atrial size was mildly dilated. 6. Right atrial size was normal. 7. Small pericardial effusion. 8. The mitral valve is normal in structure. Trace mitral valve regurgitation. No evidence of mitral stenosis. 9. The tricuspid valve is normal in structure. Tricuspid valve regurgitation is mild. 10. The aortic valve is normal in structure. Aortic valve regurgitation is trivial. No evidence of aortic valve sclerosis or stenosis. 11. There is Mild calcification of the aortic valve. 12. There is Mild thickening of the aortic valve. 13. The pulmonic valve was normal in structure. Pulmonic valve regurgitation is not visualized. 14. Moderately elevated pulmonary artery systolic pressure. 15. The inferior vena cava is dilated in size with <50% respiratory variability, suggesting right atrial pressure of 15 mmHg.  Patient Profile     84 y.o. female with a hx of PAF, HL, DM, HTN, hypothyroidism, CHF, ITP  who is being seen for the evaluation of elevated troponin at the request of Dr. Dareen Piano.  Assessment & Plan    Elevated Troponin:   I suspect demand ischemia.  Low normal EF as above.  No further ischemia work up.   Acute hypoxic respiratory failure: felt to be related to multifocal PNA   PAF:   SR on admission.  She is tolerating Eliquis.      Chronic diastolic HF:   Difficult to assess volume status.  Net negative 2.7 liters. Give IV Lasix after transfusion and then switch to PO.   Thrombocytopenia: Followed by Dr. Marin Olp.   To get blood transfusion for progressive anemia.    HL:    Continue statin.   CKD IV:   Up mildly.  Lasix as above.    Anemia:  Transfusion as above watching for volume overload.    For  questions or updates, please contact [place] Please consult [email or web address] for contact info under Cardiology/STEMI.   Signed, Minus Breeding, MD  07/07/2019, 10:52 AM

## 2019-07-07 NOTE — Progress Notes (Signed)
NAME:  [name], MRN:  [id number], DOB:  Jul 27, 1935, LOS: 4 ADMISSION DATE:  07/03/2019, CONSULTATION DATE:  10/28 REFERRING MD:  Dr. Charleen Kirks FPTS, CHIEF COMPLAINT:  Prolonged respiratory failure   Brief History   84yo F with extensive past medical history presented SOB with increased oxygen demands, requiring initiation of BIPAP therapy. Patient has been unable to wean from NIV therapy over 2 days.  PCCM consulted for further management.   History of present illness   [name] is an 84yo female with extensive past medical history including OSA, chronic hypoxic  respiratory failure, CKD, and diastolic congestive heart failure who presented for local ALF with progressive shortness of breath requiring initiation of BIPAP therapy and has been unable to wean NIV since arrival 36hrs ago.   Patient underwent successful cardioversion 10/19 and was unable to wean from supplemental oxygen at time of discharge. She was discharged back to ALF with supplemental oxygen and has had progressive need for higher levels of oxygen since discharge. Per daughter patient was diagnosed with viral pneumonia at ALF but when dyspnea worsened patient was brought to ED. Daughter reported increased lower extremity edema prior to admission. Denied any fever, chills, weight changes, chest pain, cough, nausea/vomiting, or urinary symptoms.   On arrival patient was seen with increased work of breathing, diaphoretic, with shortness of breath requiring application of BiPAP. CT chest with widespread ground-glass throughout both lungs, small bilateral effusions, cardiomegaly with enlargement of main pulmonary artery. Also noted progressive incline of troponin since arrival.   PCCM consulted for further management.  Past Medical History  Hypertension Type 2 diabetes Syncope Chronic hypoxic respiratory failure OSA on CPAP Obesity  Asthma  Depression Anxiety  Significant Hospital Events   10/26 Admitted   Consults:  PCCM  Procedures:    Significant Diagnostic Tests:  CT Chest 10/27 >> widespread ground-glass throughout both lungs, small bilateral effusions, cardiomegaly with enlargement of main pulmonary artery.  Micro Data:  COVID 10/26 >> neg Urine culture 10/26 >> Klebsilla pna, resistant to ampicillin Blood culture   Antimicrobials:  10/26 Cefepime >> 10/26 Vancomycin >> Doxycycline 10/28 >>  Interim history/subjective:  No acute change.   Was able to be off BiPAP for about 3 hours yesterday, has not tried today yet Still desaturates significantly being off BiPAP  Objective   Blood pressure (!) 143/65, pulse 68, temperature 98.1 F (36.7 C), temperature source Oral, resp. rate 20, height 5\' 1"  (1.549 m), weight 111.5 kg, SpO2 (!) 89 %.    FiO2 (%):  [50 %-60 %] 50 %   Intake/Output Summary (Last 24 hours) at 07/07/2019 1528 Last data filed at 07/07/2019 0400 Gross per 24 hour  Intake 650.39 ml  Output 1550 ml  Net -899.61 ml   Filed Weights   07/03/19 1722 07/06/19 0635 07/07/19 0614  Weight: 113.4 kg 111.2 kg 111.5 kg    Examination: General: Obese, chronically ill-appearing HEENT: BiPAP in place Neuro: Awake and alert, following commands  CV: S1-S2 PULM: Decreased breath sounds appreciated, does not appear tachypneic GI: Soft, nontender, bowel sounds appreciated  extremities: Warm and dry Skin: no rashes or lesions  CT scan of the chest reviewed showing multifocal infiltrates  Resolved Hospital Problem list     Assessment & Plan:   Acute on chronic hypoxic and hypercapnic respiratory failure History of obstructive sleep apnea-was compliant with CPAP use at home  Possibilities include atypical pneumonia, decompensated heart failure, interstitial lung disease is also a possibility-acute interstitial pneumonitis She continues  on antibiotics On steroids, Solu-Medrol 40 twice daily She is being transfused today for low hematocrit-this may aid with  desaturations, being able to keep her off BiPAP or high flow oxygen with high FiO2  We can try on high flow during the day Place her back on BiPAP at night as she does use CPAP regularly at home  Continue bronchodilators Monitor I's and O's  Acute on chronic diastolic congestive heart failure History of intrafibrillation Echocardiogram noted Cautious diuresis Trend renal functions  Patient may be taken off BiPAP and placed on high flow oxygen as tolerated during the day Keep on BiPAP at night  Will continue to follow  Sherrilyn Rist, MD [place], PCCM

## 2019-07-07 NOTE — Progress Notes (Signed)
Pharmacy Antibiotic Note  [name] is a 84 y.o. female admitted on 07/03/2019 with acute respiratory failure with concern for bronchitis. Pharmacy has been consulted for vancomycin and cefepime dosing, doxycycline added by CCM. Pt remains on BiPAP, cultures unrevealing, currently day #5 of broad spectrum antibiotics. Recommended to IMTS to consider de-escalating antimicrobials - could consider doxycycline + cefepime for 7 total days (last doses 11/1).  Plan: -Continue vancomycin 1500mg  IV q48h for now -Continue cefepime 2g IV q24h for now -Continue doxycycline 100mg  IV BID for now -As above, would recommend consolidating antibiotics -Will defer vancomycin levels for now - follow Cr    Height: 5\' 1"  (154.9 cm) Weight: 245 lb 14.4 oz (111.5 kg) IBW/kg (Calculated) : 47.8  Temp (24hrs), Avg:98.1 F (36.7 C), Min:97.5 F (36.4 C), Max:98.9 F (37.2 C)  Recent Labs  Lab 07/03/19 1720 07/03/19 1735 07/03/19 2020 07/04/19 0422 07/04/19 1226 07/05/19 0224 07/06/19 0534 07/06/19 1555 07/07/19 0428  WBC 12.6*  --   --  10.8* 11.8* 8.9 9.9 12.0* 9.5  CREATININE 2.59* 2.60*  --  2.56*  --  2.68* 2.57*  --  2.69*  LATICACIDVEN 1.9  --  1.3  --   --   --   --   --   --     Estimated Creatinine Clearance: 19 mL/min (A) (by C-G formula based on SCr of 2.69 mg/dL (H)).    Allergies  Allergen Reactions  . Adhesive [Tape] Other (See Comments)    PULLS OFF THE SKIN  . Apixaban Other (See Comments)    Internal Bleeding  . Aspirin Itching, Rash, Hives and Swelling    Swelling of her tongue  . Mirabegron Other (See Comments)    Patient experienced A-Fib  . Pineapple Anaphylaxis and Swelling    Throat swells and blisters on tongue and roof of mouth per patient  . Metformin Diarrhea and Nausea Only  . Tetracycline Hives  . Fluticasone-Salmeterol Itching and Rash  . Iodinated Diagnostic Agents Itching and Rash       . Lactose Intolerance (Gi) Other (See Comments)    Gas   .  Latex Itching, Rash and Other (See Comments)    Pulls off the skin and causes welts  . Lisinopril Cough  . Metronidazole Other (See Comments)    Reaction not known  . Sulfa Antibiotics Rash  . Sulfonamide Derivatives Itching and Rash    Antimicrobials this admission: Vancomycin 10/26 >> Cefepime 10/26 >> Doxycycline 10/28 >>   Microbiology results: 10/29 MRSA PCR: positive 10/26 BCx: NGTD x4 days 10/26 UCx: Kleb pneumoniae (R: ampicillin) 10/26 COVID: negative   Thank you for allowing pharmacy to be a part of this patient's care.  Arrie Senate, PharmD, BCPS Clinical Pharmacist [phone] Please check AMION for all [place] numbers 07/07/2019

## 2019-07-07 NOTE — Progress Notes (Signed)
[name] is doing okay.  She is relatively stable.  Her daughter says she was off the BiPAP for a few hours yesterday.  Her platelet count is coming up quite nicely.  Today, her platelet count is 87,000.  Her hemoglobin is only 7.7.  I really think she has been had to be transfused.  I really doubt that given her renal insufficiency that she is going to be able to mount a good erythropoietic response.  I know that she is iron deficient.  With this Klebsiella urinary tract infection.  We want to hold off on the iron for right now.  I talked to she and her daughter about the transfusion.  They are in agreement with this.  I will give her 1 unit of blood.  I think that the thrombocytopenia is clearly related to this urinary tract infection.  Its gram-negative rod infection which is very adept at causing thrombocytopenia.  She is on 3 different antibiotics.  Her blood sugars are still a little on the high side.  I think the blood transfusion will help her out quite a bit.  Her vital signs all look relatively good.  Her temperature is 97.8.  Pulse 60.  Blood pressure 123/53.  Her lungs sound pretty good on the left side.  There is decreased on the right side.  Cardiac exam is regular rate and rhythm.  She is on low-dose Eliquis.  With her platelet count going back up, I think we can increase the dose back up to 5 mg p.o. twice daily.  Hopefully, another chest x-ray will be done so we can see how her lungs look with this interstitial process.  Sara Haw, MD  [place] 4:13

## 2019-07-08 DIAGNOSIS — I484 Atypical atrial flutter: Secondary | ICD-10-CM

## 2019-07-08 DIAGNOSIS — J9601 Acute respiratory failure with hypoxia: Secondary | ICD-10-CM | POA: Diagnosis not present

## 2019-07-08 LAB — CBC WITH DIFFERENTIAL/PLATELET
Abs Immature Granulocytes: 0.14 10*3/uL — ABNORMAL HIGH (ref 0.00–0.07)
Basophils Absolute: 0 10*3/uL (ref 0.0–0.1)
Basophils Relative: 0 %
Eosinophils Absolute: 0 10*3/uL (ref 0.0–0.5)
Eosinophils Relative: 0 %
HCT: 29.3 % — ABNORMAL LOW (ref 36.0–46.0)
Hemoglobin: 9 g/dL — ABNORMAL LOW (ref 12.0–15.0)
Immature Granulocytes: 1 %
Lymphocytes Relative: 4 %
Lymphs Abs: 0.4 10*3/uL — ABNORMAL LOW (ref 0.7–4.0)
MCH: 28.8 pg (ref 26.0–34.0)
MCHC: 30.7 g/dL (ref 30.0–36.0)
MCV: 93.9 fL (ref 80.0–100.0)
Monocytes Absolute: 0.2 10*3/uL (ref 0.1–1.0)
Monocytes Relative: 2 %
Neutro Abs: 9 10*3/uL — ABNORMAL HIGH (ref 1.7–7.7)
Neutrophils Relative %: 93 %
Platelets: 104 10*3/uL — ABNORMAL LOW (ref 150–400)
RBC: 3.12 MIL/uL — ABNORMAL LOW (ref 3.87–5.11)
RDW: 18.6 % — ABNORMAL HIGH (ref 11.5–15.5)
WBC: 9.7 10*3/uL (ref 4.0–10.5)
nRBC: 0 % (ref 0.0–0.2)

## 2019-07-08 LAB — CBC
HCT: 29.6 % — ABNORMAL LOW (ref 36.0–46.0)
Hemoglobin: 9 g/dL — ABNORMAL LOW (ref 12.0–15.0)
MCH: 28.6 pg (ref 26.0–34.0)
MCHC: 30.4 g/dL (ref 30.0–36.0)
MCV: 94 fL (ref 80.0–100.0)
Platelets: 116 10*3/uL — ABNORMAL LOW (ref 150–400)
RBC: 3.15 MIL/uL — ABNORMAL LOW (ref 3.87–5.11)
RDW: 18.2 % — ABNORMAL HIGH (ref 11.5–15.5)
WBC: 10.1 10*3/uL (ref 4.0–10.5)
nRBC: 0 % (ref 0.0–0.2)

## 2019-07-08 LAB — CULTURE, BLOOD (ROUTINE X 2)
Culture: NO GROWTH
Culture: NO GROWTH
Special Requests: ADEQUATE
Special Requests: ADEQUATE

## 2019-07-08 LAB — TYPE AND SCREEN
ABO/RH(D): A NEG
Antibody Screen: NEGATIVE
Unit division: 0

## 2019-07-08 LAB — BASIC METABOLIC PANEL
Anion gap: 15 (ref 5–15)
BUN: 102 mg/dL — ABNORMAL HIGH (ref 8–23)
CO2: 27 mmol/L (ref 22–32)
Calcium: 10 mg/dL (ref 8.9–10.3)
Chloride: 105 mmol/L (ref 98–111)
Creatinine, Ser: 2.51 mg/dL — ABNORMAL HIGH (ref 0.44–1.00)
GFR calc Af Amer: 20 mL/min — ABNORMAL LOW (ref >60)
GFR calc non Af Amer: 17 mL/min — ABNORMAL LOW (ref >60)
Glucose, Bld: 326 mg/dL — ABNORMAL HIGH (ref 70–99)
Potassium: 4.7 mmol/L (ref 3.5–5.1)
Sodium: 147 mmol/L — ABNORMAL HIGH (ref 135–145)

## 2019-07-08 LAB — GLUCOSE, CAPILLARY
Glucose-Capillary: 289 mg/dL — ABNORMAL HIGH (ref 70–99)
Glucose-Capillary: 358 mg/dL — ABNORMAL HIGH (ref 70–99)
Glucose-Capillary: 311 mg/dL — ABNORMAL HIGH (ref 70–99)
Glucose-Capillary: 213 mg/dL — ABNORMAL HIGH (ref 70–99)

## 2019-07-08 LAB — BPAM RBC
Blood Product Expiration Date: 202011062359
ISSUE DATE / TIME: 202010301521
Unit Type and Rh: 600

## 2019-07-08 MED ORDER — DEXTROSE 5 % IV SOLN
500.0000 mg | Freq: Two times a day (BID) | INTRAVENOUS | Status: AC
  Administered 2019-07-08 (×2): 500 mg via INTRAVENOUS
  Filled 2019-07-08 (×3): qty 500

## 2019-07-08 MED ORDER — METHYLPREDNISOLONE SODIUM SUCC 40 MG IJ SOLR
40.0000 mg | INTRAMUSCULAR | Status: DC
  Administered 2019-07-09 – 2019-07-11 (×3): 40 mg via INTRAVENOUS
  Filled 2019-07-08 (×3): qty 1

## 2019-07-08 MED ORDER — STERILE WATER FOR INJECTION IJ SOLN: 5.0000 mg | Freq: Two times a day (BID) | RESPIRATORY_TRACT | Status: DC

## 2019-07-08 MED ORDER — SODIUM CHLORIDE (PF) 0.9 % IJ SOLN: 10.0000 mg | Freq: Two times a day (BID) | INTRAMUSCULAR | Status: DC

## 2019-07-08 MED ORDER — HEPARIN SODIUM (PORCINE) 5000 UNIT/ML IJ SOLN
5000.0000 [IU] | Freq: Three times a day (TID) | INTRAMUSCULAR | Status: DC
  Administered 2019-07-08 (×2): 5000 [IU] via SUBCUTANEOUS
  Filled 2019-07-08 (×2): qty 1

## 2019-07-08 MED ORDER — INSULIN GLARGINE 100 UNIT/ML ~~LOC~~ SOLN
20.0000 [IU] | Freq: Every day | SUBCUTANEOUS | Status: DC
  Administered 2019-07-09 – 2019-07-13 (×5): 20 [IU] via SUBCUTANEOUS
  Filled 2019-07-08 (×6): qty 0.2

## 2019-07-08 MED ORDER — RAMELTEON 8 MG PO TABS
8.0000 mg | ORAL_TABLET | Freq: Every day | ORAL | Status: DC
  Administered 2019-07-08 – 2019-07-14 (×7): 8 mg via ORAL
  Filled 2019-07-08 (×11): qty 1

## 2019-07-08 NOTE — Evaluation (Signed)
Physical Therapy Evaluation Patient Details Name: [name] MRN: [id number] DOB: 05-May-1935 Today's Date: 07/08/2019   History of Present Illness  84yo F with extensive past medical history presented SOB with increased oxygen demands, requiring initiation of BIPAP therapy. Patient has been unable to wean from NIV therapy over 2 days.  PCCM consulted for further management.   Clinical Impression  Pt admitted with above diagnosis. Performed bed level eval as pt on NRB on arrival and RT came in to put pt on Bipap as she had called due to being sleepy.  PErformed UE and LE exercises and will progress to OOB at next session.  Pt currently with functional limitations due to the deficits listed below (see PT Problem List). Pt will benefit from skilled PT to increase their independence and safety with mobility to allow discharge to the venue listed below.      Follow Up Recommendations Other (comment)(TBA, possibly [facility] for rehab)    Equipment Recommendations  Other (comment)(TBA)    Recommendations for Other Services       Precautions / Restrictions Precautions Precautions: Fall Restrictions Weight Bearing Restrictions: No      Mobility  Bed Mobility Overal bed mobility: Needs Assistance             General bed mobility comments: declined as pt was just placed from NRB to Bipap as she stated she needed to sleep.   Transfers                    Ambulation/Gait                Stairs            Wheelchair Mobility    Modified Rankin (Stroke Patients Only)       Balance                                             Pertinent Vitals/Pain Pain Assessment: No/denies pain    Home Living Family/patient expects to be discharged to:: Assisted living([facility])   Available Help at Discharge: Available PRN/intermittently(apartment across from nursing station) Type of Home: Apartment       Home Layout: One level Home  Equipment: Walker - 2 wheels;Walker - 4 wheels;Cane - single point;Grab bars - toilet;Grab bars - tub/shower;Shower seat;Hand held shower head(Eva walker) Additional Comments: sleeps in lift chair    Prior Function Level of Independence: Needs assistance   Gait / Transfers Assistance Needed: used rollator on her own  ADL's / Homemaking Assistance Needed: Bahting and dressing with assist from staff        Hand Dominance   Dominant Hand: Right    Extremity/Trunk Assessment   Upper Extremity Assessment Upper Extremity Assessment: Defer to OT evaluation    Lower Extremity Assessment Lower Extremity Assessment: Generalized weakness       Communication   Communication: No difficulties  Cognition Arousal/Alertness: Lethargic Behavior During Therapy: Flat affect Overall Cognitive Status: Within Functional Limits for tasks assessed                                        General Comments General comments (skin integrity, edema, etc.): Pt on Bipap during session.  Pt desat at end of treatment with UE exercises to 88%  therefore quit the exercises at that point. Was >90% wiht LE Exercises but UE exercises dropped sats.     Exercises General Exercises - Upper Extremity Shoulder Flexion: AROM;Both;5 reps;Supine Shoulder Horizontal ADduction: AROM;Both;5 reps;Supine Elbow Flexion: AROM;Both;5 reps;Supine Elbow Extension: AROM;Both;5 reps;Supine General Exercises - Lower Extremity Ankle Circles/Pumps: AROM;Both;10 reps;Supine Quad Sets: AROM;Both;10 reps;Supine Gluteal Sets: AROM;Both;10 reps;Supine Heel Slides: AAROM;Both;10 reps;Supine Hip ABduction/ADduction: AAROM;Both;5 reps;Supine Straight Leg Raises: AROM;Both;10 reps;AAROM;Supine   Assessment/Plan    PT Assessment Patient needs continued PT services  PT Problem List Decreased activity tolerance;Decreased balance;Decreased mobility;Decreased strength;Decreased range of motion;Decreased knowledge of use of  DME;Decreased safety awareness;Decreased knowledge of precautions;Obesity;Cardiopulmonary status limiting activity       PT Treatment Interventions DME instruction;Gait training;Functional mobility training;Therapeutic activities;Therapeutic exercise;Balance training;Patient/family education    PT Goals (Current goals can be found in the Care Plan section)  Acute Rehab PT Goals Patient Stated Goal: to go back to A living PT Goal Formulation: With patient Time For Goal Achievement: 07/22/19 Potential to Achieve Goals: Good    Frequency Min 3X/week   Barriers to discharge        Co-evaluation               AM-PAC PT "6 Clicks" Mobility  Outcome Measure Help needed turning from your back to your side while in a flat bed without using bedrails?: Total Help needed moving from lying on your back to sitting on the side of a flat bed without using bedrails?: Total Help needed moving to and from a bed to a chair (including a wheelchair)?: Total Help needed standing up from a chair using your arms (e.g., wheelchair or bedside chair)?: Total Help needed to walk in hospital room?: Total Help needed climbing 3-5 steps with a railing? : Total 6 Click Score: 6    End of Session Equipment Utilized During Treatment: Gait belt;Oxygen Activity Tolerance: Patient limited by fatigue Patient left: in bed;with call bell/phone within reach;with family/visitor present Nurse Communication: Mobility status;Need for lift equipment PT Visit Diagnosis: Muscle weakness (generalized) (M62.81)    Time: 1351-1410 PT Time Calculation (min) (ACUTE ONLY): 19 min   Charges:   PT Evaluation $PT Eval Moderate Complexity: 1 Mod          [name],PT Acute Rehabilitation Services Pager:  [phone]  Office:  [phone]    [name] 07/08/2019, 4:12 PM

## 2019-07-08 NOTE — Progress Notes (Signed)
Progress Note  Patient Name: [name] Date of Encounter: 07/08/2019  Primary Cardiologist: [name], MD   Subjective   Feeling better, less short of breath but still not at baseline  Inpatient Medications    Scheduled Meds: . atorvastatin  40 mg Oral QHS  . Chlorhexidine Gluconate Cloth  6 each Topical Daily  . cholecalciferol  2,000 Units Oral Daily  . diltiazem  180 mg Oral QPM  . fenofibrate  54 mg Oral Daily  . furosemide  40 mg Oral Daily  . influenza vaccine adjuvanted  0.5 mL Intramuscular Tomorrow-1000  . insulin aspart  0-20 Units Subcutaneous TID WC  . insulin aspart  0-5 Units Subcutaneous QHS  . [START ON 07/09/2019] insulin glargine  20 Units Subcutaneous QAC breakfast  . insulin glargine  60 Units Subcutaneous QHS  . ipratropium-albuterol  3 mL Nebulization TID  . levothyroxine  112 mcg Oral Daily  . mouth rinse  15 mL Mouth Rinse q12n4p  . methylPREDNISolone (SOLU-MEDROL) injection  40 mg Intravenous Q12H  . multivitamin with minerals  1 tablet Oral Daily  . mupirocin ointment  1 application Nasal BID  . pantoprazole (PROTONIX) IV  40 mg Intravenous Q24H  . sertraline  100 mg Oral QPM  . sodium chloride flush  3 mL Intravenous Q12H  . vitamin C  250 mg Oral Daily   Continuous Infusions: . ceFEPime (MAXIPIME) IV 2 g (07/07/19 2138)  . doxycycline (VIBRAMYCIN) IV 100 mg (07/08/19 0928)   PRN Meds: acetaminophen **OR** acetaminophen   Vital Signs    Vitals:   07/08/19 0645 07/08/19 0842 07/08/19 0910 07/08/19 0915  BP: (!) 113/51  (!) 146/50   Pulse: (!) 55  (!) 57   Resp: 18  16   Temp: 97.7 F (36.5 C)  97.8 F (36.6 C)   TempSrc: Oral  Oral   SpO2: 95% 90% 97% 94%  Weight: 112.4 kg     Height:        Intake/Output Summary (Last 24 hours) at 07/08/2019 1143 Last data filed at 07/08/2019 1000 Gross per 24 hour  Intake 362.45 ml  Output 1800 ml  Net -1437.55 ml   Last 3 Weights 07/08/2019 07/07/2019 07/06/2019  Weight (lbs)  247 lb 11.2 oz 245 lb 14.4 oz 245 lb 3.2 oz  Weight (kg) 112.356 kg 111.54 kg 111.222 kg      Telemetry    NSR - Personally Reviewed  ECG    NSR nonspecific ST changes.- Personally Reviewed  Physical Exam   GEN: No acute distress.  Facemask oxygen. Neck: No JVD Cardiac: RRR, no murmurs, rubs, or gallops.  Respiratory:  Mildly increased work of breathing.  Coarse breath sounds bilaterally. GI: Soft, nontender, non-distended  MS: No edema; No deformity. Neuro:  Nonfocal  Psych: Normal affect   Labs    High Sensitivity Troponin:   Recent Labs  Lab 07/03/19 2020 07/03/19 2307 07/04/19 0422 07/04/19 1521 07/05/19 1540  TROPONINIHS 132* 316* 422* 520* 232*      Chemistry Recent Labs  Lab 07/03/19 1720  07/06/19 0534 07/07/19 0428 07/08/19 0538  NA 142   < > 144 146* 147*  K 3.8   < > 4.4 4.2 4.7  CL 99   < > 103 104 105  CO2 30   < > 30 30 27   GLUCOSE 207*   < > 283* 212* 326*  BUN 39*   < > 61* 81* 102*  CREATININE 2.59*   < >  2.57* 2.69* 2.51*  CALCIUM 9.3   < > 8.9 9.4 10.0  PROT 8.1  --   --   --   --   ALBUMIN 3.8  --   --   --   --   AST 33  --   --   --   --   ALT 30  --   --   --   --   ALKPHOS 67  --   --   --   --   BILITOT 0.8  --   --   --   --   GFRNONAA 17*   < > 17* 16* 17*  GFRAA 19*   < > 20* 18* 20*  ANIONGAP 13   < > 11 12 15    < > = values in this interval not displayed.     Hematology Recent Labs  Lab 07/07/19 0428 07/07/19 2117 07/08/19 0538  WBC 9.5 10.1 9.7  RBC 2.68* 3.04* 3.12*  HGB 7.7* 8.8* 9.0*  HCT 25.2* 28.3* 29.3*  MCV 94.0 93.1 93.9  MCH 28.7 28.9 28.8  MCHC 30.6 31.1 30.7  RDW 18.1* 18.0* 18.6*  PLT 87* 99* 104*    BNP Recent Labs  Lab 07/03/19 1720  BNP 285.2*     DDimer No results for input(s): DDIMER in the last 168 hours.   Radiology    No results found.  Cardiac Studies   Echocardiogram 07/05/2019-EF 50 to 55%  Patient Profile     84 y.o. female with paroxysmal atrial fibrillation  diabetes with hypertension and hyperlipidemia, ITP with troponin of 400, acute respiratory failure ARDS-like  Assessment & Plan    Acute hypoxic respiratory failure -Likely multifocal pneumonia -Diuresis is also taken place.  Lasix.  Now p.o. --4.1 L out since admission  Elevated troponin -Agree that troponin elevation 400 in the setting is likely relatable to the hypoxic respiratory failure causing myocardial injury in this setting.,  Demand ischemia.  Paroxysmal atrial flutter -Okay to continue with diltiazem.  No changes made.  Recent cardioversion.  Recommend continued Eliquis.  Severe anemia -Status post transfusion 1 unit  Chronic diastolic heart failure -Agree that it is challenging to assess her fluid status.  Continue with current plan.  Lasix.  Chronic kidney disease stage IV continue to monitor.  Hyperlipidemia -Continue with statin  No further recommendations at this time. [facility] will sign off.   Medication Recommendations:  No cew Other recommendations (labs, testing, etc):  none Follow up as an outpatient:  As sched  For questions or updates, please contact [facility] Please consult [email or web address] for contact info under        Signed, [name], MD  07/08/2019, 11:43 AM

## 2019-07-08 NOTE — Progress Notes (Signed)
NAME:  [name], MRN:  [id number], DOB:  12/13/1934, LOS: 5 ADMISSION DATE:  07/03/2019, CONSULTATION DATE:  10/28 REFERRING MD:  Dr. Charleen Kirks FPTS, CHIEF COMPLAINT:  Prolonged respiratory failure   Brief History   84yo F with extensive past medical history presented SOB with increased oxygen demands, requiring initiation of BIPAP therapy. Patient has been unable to wean from NIV therapy over 2 days.  PCCM consulted for further management.   History of present illness   [name] is an 84yo [name] with extensive past medical history including OSA, chronic hypoxic  respiratory failure, CKD, and diastolic congestive heart failure who presented for local ALF with progressive shortness of breath requiring initiation of BIPAP therapy and has been unable to wean NIV since arrival 36hrs ago.   Patient underwent successful cardioversion 10/19 and was unable to wean from supplemental oxygen at time of discharge. She was discharged back to ALF with supplemental oxygen and has had progressive need for higher levels of oxygen since discharge. Per daughter patient was diagnosed with viral pneumonia at ALF but when dyspnea worsened patient was brought to ED. Daughter reported increased lower extremity edema prior to admission. Denied any fever, chills, weight changes, chest pain, cough, nausea/vomiting, or urinary symptoms.   On arrival patient was seen with increased work of breathing, diaphoretic, with shortness of breath requiring application of BiPAP. CT chest with widespread ground-glass throughout both lungs, small bilateral effusions, cardiomegaly with enlargement of main pulmonary artery. Also noted progressive incline of troponin since arrival.   PCCM consulted for further management.  Past Medical History  Hypertension Type 2 diabetes Syncope Chronic hypoxic respiratory failure OSA on CPAP Obesity  Asthma  Depression Anxiety  Significant Hospital Events   10/26 Admitted   Consults:  PCCM  Procedures:    Significant Diagnostic Tests:  CT Chest 10/27 >> widespread ground-glass throughout both lungs, small bilateral effusions, cardiomegaly with enlargement of main pulmonary artery.  Micro Data:  COVID 10/26 >> neg Urine culture 10/26 >> Klebsilla pna, resistant to ampicillin Blood culture   Antimicrobials:  10/26 Cefepime >> 10/26 Vancomycin >> Doxycycline 10/28 >>  Interim history/subjective:  Off BIPAP for about 4 hours today. Daughter is encouraged by progress. Still sleeping way too much.  Objective   Blood pressure (!) 155/62, pulse 62, temperature 97.6 F (36.4 C), temperature source Oral, resp. rate 20, height 5' 1" (1.549 m), weight 112.4 kg, SpO2 92 %.    FiO2 (%):  [50 %-100 %] 50 %   Intake/Output Summary (Last 24 hours) at 07/08/2019 1435 Last data filed at 07/08/2019 1241 Gross per 24 hour  Intake 362.45 ml  Output 2250 ml  Net -1887.55 ml   Filed Weights   07/06/19 0635 07/07/19 0614 07/08/19 0645  Weight: 111.2 kg 111.5 kg 112.4 kg    Examination: GEN: obese chronically ill woman lying in bed HEENT: BIPAP in place with good seal, 500cc tidal volumes with 7 driving presssure CV: Ext warm, tough to auscultate heart sounds PULM: Poor inspiratory effort, no wheezing GI: Soft, +BS EXT: NO edema NEURO: sleeping PSYCH: sleeping SKIN: Age-related changes  CT: crazy paving pattern, dilated PA  Resolved Hospital Problem list     Assessment & Plan:   # Acute on chronic hypoxic and hypercapnic respiratory failure- strange case.  Looks almost like alveolar hemorrhage on recent CT, was on [facility] and has known chronic thrombocytopenia on therapy.  Has not responded that well to diureiss, running into issues with uremia and hypernatremia.  Does not look like much more fluid to give.  No infectious symptoms.  Pct elevated slightly but no white count.  I think the intrinsic disease in her lungs is improving but remaining issues  including profound deconditioning, sedentary lifestyle, and anxiety are holding her back. Extremely high risk for bronchoscopy. # History of obstructive sleep apnea-was compliant with CPAP use at home # Chronic ITP with recent colony stimulating factor treatment; I don't see any case reports of this causing pneumonitis # DM- steroids not helping # Afib- eliquis has been on hold  - Give her a break from lasix today - Give a couple doses of diuril to help hypernatremia - Drop methylprednisolone to daily - Stop doxycycline and vanc, 7 days empiric cefepime is fine - Recheck ESR, also check ANA, ANCA, RF in AM - BIPAP PRN - She needs to get out of bed, nurses will get her to a chair tomorrow - Awake during day, sleep at night, start ramelteon - High risk for VTE, will do challenge of heparin today, eventually needs rechallenge of full AC - Appreciate continued PT input - Will order CXR for Monday and have pulmonary see her then, call if issues tomorrow  Erskine Emery MD Pulm

## 2019-07-08 NOTE — Progress Notes (Signed)
Offered to Check the Pt bottoms, however the daughter stated that day shift came to check the bottoms and pt refusing to turn at this time. Pt agreed to just put pillow on her sides to  Relieved pressure.  will monitor

## 2019-07-08 NOTE — Progress Notes (Signed)
Patient's daughter called nurse to request to have patient put on BiPAP because pt wanted to go back to sleep.  RTT called and will be seeing pt to place back on BiPAP.

## 2019-07-08 NOTE — Progress Notes (Signed)
Subjective:   [name] endorses improved SOB today compared to yesterday. Denies any acute pain.  Daughter at bedside. All questions and concerns addressed.   Objective:  Vital signs in last 24 hours: Vitals:   07/07/19 1647 07/07/19 2017 07/07/19 2112 07/08/19 0645  BP: (!) 136/50 (!) 142/48  (!) 113/51  Pulse: (!) 59 61 62 (!) 55  Resp: 16 20 (!) 24 18  Temp: 98.4 F (36.9 C) 98.3 F (36.8 C)  97.7 F (36.5 C)  TempSrc: Oral Oral  Oral  SpO2: 94% 96% 94% 95%  Weight:    112.4 kg  Height:       Physical Exam Vitals signs and nursing note reviewed.  Constitutional:      General: She is not in acute distress.    Appearance: She is obese.  Neck:     Vascular: No JVD.  Pulmonary:     Effort: Tachypnea and accessory muscle usage present. No respiratory distress.     Breath sounds: Examination of the right-middle field reveals decreased breath sounds. Examination of the left-middle field reveals decreased breath sounds. Examination of the right-lower field reveals decreased breath sounds. Examination of the left-lower field reveals decreased breath sounds. Decreased breath sounds (improved today) present. No wheezing, rhonchi or rales.  Abdominal:     General: Bowel sounds are normal.     Palpations: Abdomen is soft.     Tenderness: There is no abdominal tenderness. There is no guarding.  Musculoskeletal:     Right lower leg: No edema.     Left lower leg: No edema.  Skin:    General: Skin is warm and dry.     Comments: Signs of venous stasis in bilateral lower extremities,chronic. Unchanged examination from prior.   Neurological:     Mental Status: She is alert and oriented to person, place, and time.  Psychiatric:        Mood and Affect: Mood normal. Mood is not anxious.        Behavior: Behavior normal.    Assessment/Plan:  Active Problems:   Acute hypoxemic respiratory failure ([place])   HCAP (healthcare-associated pneumonia)  [name] is a 84 y/o  female with PMHx of HTN, CKD IV, CHF, OSA and A.fib presenting with sudden onset dyspnea in setting of increased O2 requirement for 1 week after cardioversion. CXR concerning for pulmonary edema vs bilateral pneumonia. Initial ABG with respiratory acidosis.   # Acute hypoxic respiratory failure Patient has a history of HFpEF (Echo 01/2017 with EF 65-70%). She is on an alternating schedule of 40mg  and 20mg  of Lasix daily and reports compliance prior to admission.  Initial chest xray at the ED (10/26) showed diffuse bilateral airspace opacities, likely edema vs pneumonia. CT on 10/26 showed widespread ground glass opacities bilaterally with airway thickening and scattered secretions, possibly ARDS vs infection vs edema.   Unable to switch to HFNC yesterday as respiratory therapy was unavailable. Spoke to nurse later in the morning, patient is reportedly tolerating 12 L HFNC. No further hemoptysis noted today. Cardiology wrote yesterday to plan switching IV Lasix to PO. Patient is adequately diuresed.   - PCCM on board - Continue BiPAP and wean as tolerated - Switch to PO Lasix 40mg  BID - Continue Cefepime and Doxycycline  - Solumedrol 40mg  IV BID per PCCM - Duoneb 36ml q4h  - Strict I/O's and daily weights  # Normocytic Anemia:  Iron studies show normal ferritin but low iron levels and iron saturation. TIBC is  normal but on the higher end. Holding IV Feraheme until resolution of acute infection. Hemoglobin improved today after receiving transfusion on 10/31. No evidence of acute bleed and hemoptysis is very minimal. Continue to monitor.    - CBC daily  # QTc Prolongation:  Patient with recent cardioversion for atypical atrial flutter. Her initial EKG on presentation with QTc 496. Highest during admission so far was 595, but improved. Continue to monitor with intermittent EKGs. One is ordered for today and pending.   At this time, continue home Diltiazem and avoid QTc prolonging agents as much as  possible.  - Cardiology on board and we appreciate their recommendations  - Intermittent EKGs - Cardiac monitoring - Diltiazem 180mg  qd - Avoid QT prolonging agents   # Hypertension:  Patient with history of hypertension and initially presented with SBP >200. She was placed on nitro gtt in the ED and BP responded well. Since then, her BP remains in the average 035-465K systolic but does occasionally decrease further. Steroid treatment is definitely not helping, but still relatively well controlled. No medication changes.   - Diltiazem 180mg  qd  # Thrombocytopenia # Chronic ITP Patient has a history of chronic ITP for which she receives weekly N-plate infusions and last one was on 10/23 in the outpatient setting. Dr. Marin Olp, her primary oncologist, has ordered N-plate (Romiplostim) to be administered on 10/27. Her platelets continue to improve and have reached the triple digits now at 104.   - Oncology is following and we appreciate their recommendations.  - CBC daily   # CKD Stage IV:  Creatinine continues to stay stable and actually improved today despite aggressive diuresis over admission.   - Avoid nephrotoxic agents - BMP in the A.M.   # Diabetes mellitus:  Patient with history of DMII with insulin requirement. Her PCP left a note recently with her home regimen, which is notably quite extensive. She has required persistent increases in her Lantus due to hyperglycemia. Today it is 326 this morning. Unknown how, probably from her drinks as she is NPO with sips only.  Steroids are definitely exacerbating this. Will add Lantus in the morning, 20 units, and continue Lantus 60 at night.   - CBG monitoring - Lantus 60U at bedtime, 20 units before breakfast  - Resistant SSI   # Klebsiella UTI Urine cultures drawn on admission are currently growing Klebsiella that is sensitive to everything but Ampicillin. This could reflect a chronic colonization instead of an acute issue, as her last  urine cultures were back in 2017. Remains asymptomatic during admission. No intervention necessary, as she is already on antibiotics for respiratory failure.   # Troponinemia: Resolving Likely secondary to demand ischemia in setting of hypertensive urgency and respiratory failure on presentation. Cardiology consulted and concurred with. No ischemic work up planned.     Dispo: Anticipated discharge pending clinical improvement.    Dr. Jose Persia Internal Medicine PGY-1  Pager: [phone] 07/08/2019, 7:26 AM

## 2019-07-08 NOTE — Progress Notes (Signed)
Spoke with Judson Roch, SLP regarding swallow evaluation. Family stated she would rather patient continue to rest since she is currently on BiPAP. Judson Roch, SLP requested swallow evaluation to be done tomorrow. Spoke with MD, and she is aware that swallow evaluation will be done tomorrow. Family updated and aware. Daughter at bedside. Patient currently lying in bed with eyes closed, call bell within reach.

## 2019-07-09 LAB — GLUCOSE, CAPILLARY
Glucose-Capillary: 183 mg/dL — ABNORMAL HIGH (ref 70–99)
Glucose-Capillary: 254 mg/dL — ABNORMAL HIGH (ref 70–99)
Glucose-Capillary: 251 mg/dL — ABNORMAL HIGH (ref 70–99)
Glucose-Capillary: 375 mg/dL — ABNORMAL HIGH (ref 70–99)

## 2019-07-09 LAB — CBC WITH DIFFERENTIAL/PLATELET
Abs Immature Granulocytes: 0.11 10*3/uL — ABNORMAL HIGH (ref 0.00–0.07)
Basophils Absolute: 0 10*3/uL (ref 0.0–0.1)
Basophils Relative: 0 %
Eosinophils Absolute: 0.1 10*3/uL (ref 0.0–0.5)
Eosinophils Relative: 1 %
HCT: 30.3 % — ABNORMAL LOW (ref 36.0–46.0)
Hemoglobin: 9.2 g/dL — ABNORMAL LOW (ref 12.0–15.0)
Immature Granulocytes: 1 %
Lymphocytes Relative: 7 %
Lymphs Abs: 0.6 10*3/uL — ABNORMAL LOW (ref 0.7–4.0)
MCH: 28.7 pg (ref 26.0–34.0)
MCHC: 30.4 g/dL (ref 30.0–36.0)
MCV: 94.4 fL (ref 80.0–100.0)
Monocytes Absolute: 0.5 10*3/uL (ref 0.1–1.0)
Monocytes Relative: 6 %
Neutro Abs: 7.5 10*3/uL (ref 1.7–7.7)
Neutrophils Relative %: 85 %
Platelets: 126 10*3/uL — ABNORMAL LOW (ref 150–400)
RBC: 3.21 MIL/uL — ABNORMAL LOW (ref 3.87–5.11)
RDW: 18.3 % — ABNORMAL HIGH (ref 11.5–15.5)
WBC: 8.8 10*3/uL (ref 4.0–10.5)
nRBC: 0 % (ref 0.0–0.2)

## 2019-07-09 LAB — BASIC METABOLIC PANEL
Anion gap: 10 (ref 5–15)
BUN: 105 mg/dL — ABNORMAL HIGH (ref 8–23)
CO2: 33 mmol/L — ABNORMAL HIGH (ref 22–32)
Calcium: 10.2 mg/dL (ref 8.9–10.3)
Chloride: 106 mmol/L (ref 98–111)
Creatinine, Ser: 2.61 mg/dL — ABNORMAL HIGH (ref 0.44–1.00)
GFR calc Af Amer: 19 mL/min — ABNORMAL LOW (ref >60)
GFR calc non Af Amer: 17 mL/min — ABNORMAL LOW (ref >60)
Glucose, Bld: 195 mg/dL — ABNORMAL HIGH (ref 70–99)
Potassium: 4 mmol/L (ref 3.5–5.1)
Sodium: 149 mmol/L — ABNORMAL HIGH (ref 135–145)

## 2019-07-09 LAB — SEDIMENTATION RATE: Sed Rate: 81 mm/hr — ABNORMAL HIGH (ref 0–22)

## 2019-07-09 MED ORDER — PANTOPRAZOLE SODIUM 40 MG PO TBEC
40.0000 mg | DELAYED_RELEASE_TABLET | Freq: Every day | ORAL | Status: DC
  Administered 2019-07-10 – 2019-07-18 (×9): 40 mg via ORAL
  Filled 2019-07-09 (×9): qty 1

## 2019-07-09 MED ORDER — FREE WATER
300.0000 mL | Freq: Three times a day (TID) | Status: DC
  Administered 2019-07-09 – 2019-07-12 (×8): 300 mL via ORAL

## 2019-07-09 MED ORDER — APIXABAN 2.5 MG PO TABS
2.5000 mg | ORAL_TABLET | Freq: Two times a day (BID) | ORAL | Status: DC
  Administered 2019-07-09 – 2019-07-18 (×19): 2.5 mg via ORAL
  Filled 2019-07-09 (×20): qty 1

## 2019-07-09 MED ORDER — SODIUM CHLORIDE 0.9 % IV SOLN
INTRAVENOUS | Status: DC | PRN
  Administered 2019-07-09: 250 mL via INTRAVENOUS

## 2019-07-09 NOTE — Progress Notes (Signed)
     07/09/19 1300  Mobility  Activity Stood at bedside;Repositioned in chair (Bedpan in chair/famly's request w/assistance RN aware)  Hygiene  Hygiene Peri care  Skin Care Incontinent cleanser;Protective barrier;Moisture barrier  Linen Change Gown changed;Bed pad changed   Patient refuses repositioning in chair every hour. Daughter at bedside. Patient is able to move side to side. Donut pillow in place per pt request. Will remove if patient wants it off.

## 2019-07-09 NOTE — Progress Notes (Signed)
Subjective:   [name] Denies assessed at bedside this morning. She denies any acute pain. Her breathing continues to improve and patient's daughter reports that she is not requiring BiPAP as frequently as she initially did. She is currently on 13L via NRB.   Daughter at bedside. All questions and concerns addressed.   Objective:  Vital signs in last 24 hours: Vitals:   07/08/19 2347 07/09/19 0422 07/09/19 0740 07/09/19 0842  BP: (!) 123/40   139/60  Pulse: (!) 58 62  (!) 59  Resp: 20 (!) 24  16  Temp: 97.6 F (36.4 C)   97.6 F (36.4 C)  TempSrc: Oral   Oral  SpO2: 95% 93% 91% 96%  Weight:      Height:       Physical Exam Vitals signs and nursing note reviewed.  Constitutional:      General: She is not in acute distress.    Appearance: She is obese.  Neck:     Vascular: No JVD.  Pulmonary:     Effort: Pulmonary effort is normal. No accessory muscle usage or respiratory distress.     Breath sounds: Examination of the right-lower field reveals decreased breath sounds. Examination of the left-lower field reveals decreased breath sounds. Decreased breath sounds (improved today) present. No wheezing, rhonchi or rales.  Chest:     Chest wall: No tenderness, crepitus or edema.  Abdominal:     General: Bowel sounds are normal.     Palpations: Abdomen is soft.     Tenderness: There is no abdominal tenderness. There is no guarding.  Musculoskeletal:     Right lower leg: No edema.     Left lower leg: No edema.  Skin:    General: Skin is warm and dry.     Capillary Refill: Capillary refill takes less than 2 seconds.     Comments: Signs of venous stasis in bilateral lower extremities,chronic. Unchanged examination from prior.   Neurological:     Mental Status: She is alert and oriented to person, place, and time.  Psychiatric:        Mood and Affect: Mood normal. Mood is not anxious.        Behavior: Behavior normal.    Assessment/Plan:  [name] is a 84 y/o  female with PMHx of HTN, CKD IV, CHF, OSA and A.fib presenting with sudden onset dyspnea in setting of increased O2 requirement for 1 week after cardioversion.   Acute hypoxic respiratory failure Patient has a history of HFpEF (Echo 01/2017 with EF 65-70%). She is on an alternating schedule of 40mg  and 20mg  of Lasix daily and reports compliance prior to admission.  Initial chest xray at the ED (10/26) showed diffuse bilateral airspace opacities, likely edema vs pneumonia. CT on 10/26 showed widespread ground glass opacities bilaterally with airway thickening and scattered secretions, possibly ARDS vs infection vs edema. Per cardiology recommendations, patient is on PO lasix. Per PCCM, patient's solumedrol decreased to 40mg  IV daily and patient to continue cefepime for 7 day course with d/c of vancomycin and doxycycline.  Patient is on 13L via NRB today and is tolerating it well with O2 sats >90%. She was on BiPAP overnight while sleeping.  - PCCM on board - Switch to PO Lasix 40mg  BID - Continue Cefepime Day 6/7 - Solumedrol 40mg  IV qd per PCCM - Duoneb 22ml q4h  - BiPAP prn  - Strict I/O's and daily weights  QTc Prolongation:  Patient with recent cardioversion for atypical  atrial flutter. Her initial EKG on presentation with QTc 496. Highest during admission so far was 595 with most recent being 54ms. Continue to monitor with intermittent EKGs. Cardiology recommendations to continue with diltiazem at this time and recommend continued Eliquis.  - Intermittent EKGs - Cardiac monitoring - Diltiazem 180mg  qd - Avoid QT prolonging agents   Hypertension:  Patient with history of hypertension and initially presented with SBP >200. She was placed on nitro gtt in the ED and BP responded well. Since then, her BP remains in the average 409-735H systolic but does occasionally decrease further. Steroid treatment is definitely not helping, but still relatively well controlled. No medication changes.  -  Diltiazem 180mg  qd  Normocytic Anemia:  Iron studies show normal ferritin but low iron levels and iron saturation. TIBC is normal but on the higher end. Holding IV Feraheme until resolution of acute infection. Hemoglobin improved today after receiving transfusion on 10/31. No evidence of acute bleed and hemoptysis is very minimal. Continue to monitor.   - CBC daily  Thrombocytopenia in setting of chronic ITP Patient has a history of chronic ITP for which she receives weekly N-plate infusions and last one was on 10/23 in the outpatient setting. Dr. Marin Olp, her primary oncologist, has ordered N-plate (Romiplostim) to be administered on 10/27. Her platelets continue to improve with most recent plt count of 126. - CBC daily   CKD Stage IV:  Cr slightly worsened at 2.61 today (2.51 yesterday). Suspect this may be in setting of dehydration given that her Na also increased.  - Free water repletion - Avoid nephrotoxic agents - BMP in the A.M.   Diabetes mellitus:  Patient with history of DMII with insulin requirement. Her PCP left a note recently with her home regimen, which is notably quite extensive. She has required persistent increases in her Lantus due to hyperglycemia. Today it is 326 this morning. Unknown how, probably from her drinks as she is NPO with sips only.  Steroids are definitely exacerbating this. Will add Lantus in the morning, 20 units, and continue Lantus 60 at night.  - CBG monitoring - Lantus 60U at bedtime, 20 units before breakfast  - Resistant SSI   Klebsiella UTI Urine cultures drawn on admission are currently growing Klebsiella that is sensitive to everything but Ampicillin. This could reflect a chronic colonization instead of an acute issue, as her last urine cultures were back in 2017. Remains asymptomatic during admission. No intervention necessary, as she is already on antibiotics for respiratory failure.    Dispo: Anticipated discharge pending clinical improvement.     Dr. Jose Persia Internal Medicine PGY-1  Pager: [phone] 07/09/2019, 12:19 PM

## 2019-07-09 NOTE — Progress Notes (Signed)
Internal Medicine Attending:   I saw and examined the patient. I reviewed the resident's note and I agree with the resident's findings and plan as documented in the resident's note.  Patient states that she feels better this morning and appears to be doing well on her current nonrebreather mask.  Patient was able to come off BiPAP for 4-5 hours yesterday.  Patient is initially made to the hospital with acute hypoxic respiratory failure likely secondary to underlying infection versus ARDS.  Pulmonary follow-up and recommendations appreciated.  Solu-Medrol to be decreased to 40 mg IV daily per PCCM.  Continue with BiPAP as needed.  Will complete 7-day course of antibiotics with cefepime (finished 5-day course of doxycycline as well).  Of note, patient sodium is increasing and is now up to 149.  I suspect this is likely secondary to decreased oral intake as patient has been requiring BiPAP as well as excessive diuresis.  Continue with free water for now.  Hold Lasix.  No further work-up at this time.  Would consider starting the patient on D5W at a low rate if she continues to have poor oral intake.

## 2019-07-09 NOTE — Evaluation (Signed)
Clinical/Bedside Swallow Evaluation Patient Details  Name: [name] MRN: [id number] Date of Birth: 09-26-1934  Today's Date: 07/09/2019 Time: SLP Start Time (ACUTE ONLY): 1000 SLP Stop Time (ACUTE ONLY): 1030 SLP Time Calculation (min) (ACUTE ONLY): 30 min  Past Medical History:  Past Medical History:  Diagnosis Date  . Allergic rhinitis   . Anxiety state, unspecified    panic attacks  . CHF (congestive heart failure) ([place])   . Depressive disorder, not elsewhere classified   . Extrinsic asthma, unspecified    no problem since adulthood  . Obesity   . OSA on CPAP    severe  . Pure hypercholesterolemia   . Respiratory failure with hypoxia ([place]) 09/2008   acute, secondary to multiple bilateral pulmonary embolism , negative hypercoagulable workup 09/2008 hospital stay  . Scoliosis   . Type II or unspecified type diabetes mellitus without mention of complication, not stated as uncontrolled   . Unspecified essential hypertension   . Unspecified hypothyroidism    hypo   Past Surgical History:  Past Surgical History:  Procedure Laterality Date  . CARDIOVERSION N/A 06/26/2019   Procedure: CARDIOVERSION;  Surgeon: Jerline Pain, MD;  Location: [facility] ENDOSCOPY;  Service: Cardiovascular;  Laterality: N/A;  . EYE SURGERY    . JOINT REPLACEMENT    . knee replaced    . THYROID SURGERY     HPI:  [name] is a 84 y/o female with PMHx of HTN, CKD IV, CHF, OSA and A.fib presenting with sudden onset dyspnea in setting of increased O2 requirement for 1 week after cardioversion. CXR concerning for pulmonary edema vs bilateral pneumonia. Initial ABG with respiratory acidosis. MD concerned for alveolar hemorrhage. No infectious symptoms.    Assessment / Plan / Recommendation Clinical Impression  Pt seen on non rebreather, with a multitude of drinks with old swabs around her. SLP and daughter repositioned pt upright into a chair position, SLP brushed her teeth which resulted in brief  de-sat to mid 70s during mask removal. Pt happy to drink with a straw, consistently there was a slight throat clear/cough after sips of water, but not following cold ensure (slightly thicker) or pudding. Though this finding is indicative of possible sensed airway invasion, it is much more likely the result of decreased timing of breathing and swallowing than the cause of respitory impairment given the appearance of mild severity. Favor allowing sips of water, but with increased use of precautions and strategies, particulalry upright positioning and frequent oral care. Will upgrade diet to full liquids to allow ensure, and more filing options (soup, oatmeal etc) as pt refused regular solid trials. Provided education to daughter and pt. Will f/u for need for instrumental testing based on tolerance (pt would not be able to participate at this time given O2 needs).   SLP Visit Diagnosis: Dysphagia, oropharyngeal phase (R13.12)    Aspiration Risk  Moderate aspiration risk    Diet Recommendation Thin liquid   Liquid Administration via: Straw Medication Administration: Whole meds with puree Supervision: Staff to assist with self feeding;Full supervision/cueing for compensatory strategies Compensations: Slow rate;Small sips/bites Postural Changes: Seated upright at 90 degrees;Remain upright for at least 30 minutes after po intake    Other  Recommendations Oral Care Recommendations: Oral care QID   Follow up Recommendations Skilled Nursing facility      Frequency and Duration min 2x/week  2 weeks       Prognosis Prognosis for Safe Diet Advancement: Good      Swallow  Study   General HPI: [name] is a 84 y/o female with PMHx of HTN, CKD IV, CHF, OSA and A.fib presenting with sudden onset dyspnea in setting of increased O2 requirement for 1 week after cardioversion. CXR concerning for pulmonary edema vs bilateral pneumonia. Initial ABG with respiratory acidosis. MD concerned for alveolar  hemorrhage. No infectious symptoms.  Type of Study: Bedside Swallow Evaluation Diet Prior to this Study: Thin liquids Temperature Spikes Noted: No Respiratory Status: Non-rebreather History of Recent Intubation: No Behavior/Cognition: Alert;Cooperative Oral Cavity Assessment: Within Functional Limits Oral Care Completed by SLP: Yes Oral Cavity - Dentition: Adequate natural dentition Vision: Functional for self-feeding Self-Feeding Abilities: Total assist Patient Positioning: Upright in bed Baseline Vocal Quality: Normal Volitional Cough: Strong Volitional Swallow: Able to elicit    Oral/Motor/Sensory Function Overall Oral Motor/Sensory Function: Within functional limits   Ice Chips     Thin Liquid Thin Liquid: Impaired Presentation: Cup;Straw Pharyngeal  Phase Impairments: Cough - Immediate    Nectar Thick Nectar Thick Liquid: Within functional limits Presentation: Straw   Honey Thick Honey Thick Liquid: Not tested   Puree Puree: Within functional limits Presentation: Spoon   Solid     Solid: Not tested(pt refused)     Herbie [place], MA [place] Pager [phone] Office [phone]  Samad Thon, Katherene Ponto 07/09/2019,10:51 AM

## 2019-07-10 ENCOUNTER — Inpatient Hospital Stay (HOSPITAL_COMMUNITY): Payer: Medicare Other

## 2019-07-10 DIAGNOSIS — L8990 Pressure ulcer of unspecified site, unspecified stage: Secondary | ICD-10-CM

## 2019-07-10 DIAGNOSIS — E870 Hyperosmolality and hypernatremia: Secondary | ICD-10-CM

## 2019-07-10 DIAGNOSIS — I5030 Unspecified diastolic (congestive) heart failure: Secondary | ICD-10-CM

## 2019-07-10 DIAGNOSIS — J9621 Acute and chronic respiratory failure with hypoxia: Principal | ICD-10-CM

## 2019-07-10 DIAGNOSIS — Z7901 Long term (current) use of anticoagulants: Secondary | ICD-10-CM

## 2019-07-10 HISTORY — DX: Pressure ulcer of unspecified site, unspecified stage: L89.90

## 2019-07-10 LAB — ANA W/REFLEX IF POSITIVE: Anti Nuclear Antibody (ANA): NEGATIVE

## 2019-07-10 LAB — COMPREHENSIVE METABOLIC PANEL
ALT: 12 U/L (ref 0–44)
AST: 20 U/L (ref 15–41)
Albumin: 3 g/dL — ABNORMAL LOW (ref 3.5–5.0)
Alkaline Phosphatase: 47 U/L (ref 38–126)
Anion gap: 16 — ABNORMAL HIGH (ref 5–15)
BUN: 107 mg/dL — ABNORMAL HIGH (ref 8–23)
CO2: 27 mmol/L (ref 22–32)
Calcium: 10.4 mg/dL — ABNORMAL HIGH (ref 8.9–10.3)
Chloride: 101 mmol/L (ref 98–111)
Creatinine, Ser: 2.49 mg/dL — ABNORMAL HIGH (ref 0.44–1.00)
GFR calc Af Amer: 20 mL/min — ABNORMAL LOW (ref >60)
GFR calc non Af Amer: 18 mL/min — ABNORMAL LOW (ref >60)
Glucose, Bld: 303 mg/dL — ABNORMAL HIGH (ref 70–99)
Potassium: 4.5 mmol/L (ref 3.5–5.1)
Sodium: 144 mmol/L (ref 135–145)
Total Bilirubin: 1.4 mg/dL — ABNORMAL HIGH (ref 0.3–1.2)
Total Protein: 6.7 g/dL (ref 6.5–8.1)

## 2019-07-10 LAB — GLUCOSE, CAPILLARY
Glucose-Capillary: 279 mg/dL — ABNORMAL HIGH (ref 70–99)
Glucose-Capillary: 244 mg/dL — ABNORMAL HIGH (ref 70–99)
Glucose-Capillary: 217 mg/dL — ABNORMAL HIGH (ref 70–99)
Glucose-Capillary: 298 mg/dL — ABNORMAL HIGH (ref 70–99)

## 2019-07-10 LAB — MPO/PR-3 (ANCA) ANTIBODIES
ANCA Proteinase 3: <3.5 U/mL (ref 0.0–3.5)
Myeloperoxidase Abs: <9 U/mL (ref 0.0–9.0)

## 2019-07-10 LAB — CBC
HCT: 30.9 % — ABNORMAL LOW (ref 36.0–46.0)
Hemoglobin: 9.4 g/dL — ABNORMAL LOW (ref 12.0–15.0)
MCH: 28.7 pg (ref 26.0–34.0)
MCHC: 30.4 g/dL (ref 30.0–36.0)
MCV: 94.2 fL (ref 80.0–100.0)
Platelets: 127 10*3/uL — ABNORMAL LOW (ref 150–400)
RBC: 3.28 MIL/uL — ABNORMAL LOW (ref 3.87–5.11)
RDW: 17.6 % — ABNORMAL HIGH (ref 11.5–15.5)
WBC: 8.4 10*3/uL (ref 4.0–10.5)
nRBC: 0 % (ref 0.0–0.2)

## 2019-07-10 NOTE — Progress Notes (Signed)
SLP Cancellation Note  Patient Details Name: [name] MRN: [id number] DOB: 1934-10-16   Cancelled treatment:       Reason Eval/Treat Not Completed: Medical issues which prohibited therapy.  Pt is currently on BiPAP and is not appropriate for po intake at this time.  SLP will f/u as appropriate.     Colin Mulders., M.S., [place] [facility] Office: [phone]  [place] 07/10/2019, 9:29 AM

## 2019-07-10 NOTE — Progress Notes (Signed)
Chaplain engaged in initial visit with [name].  [name] noted that she may be going home today or may not be, but either way she was putting it in God's hands.  She stated that she has been praying about her legs and feet.    Chaplain had to leave to answer another call, but chaplain will continue to follow-up.

## 2019-07-10 NOTE — Progress Notes (Signed)
Physical Therapy Treatment Patient Details Name: [name] MRN: [id number] DOB: 25-May-1935 Today's Date: 07/10/2019    History of Present Illness 84yo F with extensive past medical history presented SOB with increased oxygen demands, requiring initiation of BIPAP therapy. Patient has been unable to wean from NIV therapy over 2 days.  PCCM consulted for further management.     PT Comments    Pt making slow progress. Continues to require high levels of supplemental O2. Feel pt will need SNF at [facility] (is currently at ALF there).    Follow Up Recommendations  SNF(at [place] where she is currently in ALF)     Equipment Recommendations  None recommended by PT    Recommendations for Other Services       Precautions / Restrictions Precautions Precautions: Fall Restrictions Weight Bearing Restrictions: No    Mobility  Bed Mobility               General bed mobility comments: Pt up in recliner  Transfers Overall transfer level: Needs assistance Equipment used: 4-wheeled walker Transfers: Sit to/from Stand Sit to Stand: Min guard;Min assist         General transfer comment: Pt performed x 5. Initial min assist to bring hips up and progressed to min guard. Verbal cues for hand placement.   Ambulation/Gait Ambulation/Gait assistance: Min assist;+2 safety/equipment Gait Distance (Feet): 2 Feet Assistive device: 4-wheeled walker Gait Pattern/deviations: Step-to pattern;Decreased step length - right;Decreased step length - left;Shuffle;Trunk flexed Gait velocity: decr Gait velocity interpretation: <1.31 ft/sec, indicative of household ambulator General Gait Details: Assist for balance and support. Verbal encouragement. Pt on NRB with SpO2 >90%   Stairs             Wheelchair Mobility    Modified Rankin (Stroke Patients Only)       Balance Overall balance assessment: Needs assistance Sitting-balance support: No upper extremity  supported;Feet supported Sitting balance-Leahy Scale: Fair     Standing balance support: Bilateral upper extremity supported Standing balance-Leahy Scale: Poor Standing balance comment: walker and min guard for static standing. Pt stood x 5 for 30-60 sec                            Cognition Arousal/Alertness: Awake/alert Behavior During Therapy: Flat affect Overall Cognitive Status: Within Functional Limits for tasks assessed                                        Exercises      General Comments        Pertinent Vitals/Pain Pain Assessment: No/denies pain    Home Living                      Prior Function            PT Goals (current goals can now be found in the care plan section) Acute Rehab PT Goals Patient Stated Goal: to go back to A living Progress towards PT goals: Progressing toward goals    Frequency    Min 3X/week      PT Plan Discharge plan needs to be updated    Co-evaluation              AM-PAC PT "6 Clicks" Mobility   Outcome Measure  Help needed turning from your back to your side while  in a flat bed without using bedrails?: Total Help needed moving from lying on your back to sitting on the side of a flat bed without using bedrails?: Total Help needed moving to and from a bed to a chair (including a wheelchair)?: A Little Help needed standing up from a chair using your arms (e.g., wheelchair or bedside chair)?: A Little Help needed to walk in hospital room?: A Lot Help needed climbing 3-5 steps with a railing? : Total 6 Click Score: 11    End of Session Equipment Utilized During Treatment: Oxygen Activity Tolerance: Patient limited by fatigue Patient left: with call bell/phone within reach;with family/visitor present;in chair Nurse Communication: Mobility status PT Visit Diagnosis: Muscle weakness (generalized) (M62.81)     Time: 6147-0929 PT Time Calculation (min) (ACUTE ONLY): 27  min  Charges:  $Therapeutic Activity: 8-22 mins                     [place] Pager [phone] Office [place] 07/10/2019, 3:26 PM

## 2019-07-10 NOTE — Progress Notes (Signed)
Subjective:   [name] assessed at bedside this morning. She is on BiPAP 50% this morning and is doing well. Per daughter, patient was able to get out of bed yesterday and sit in the chair for several hours with non-rebreather.   Daughter at bedside. Discussed with her regarding patient's clinical improvement and goals for discharge. All questions and concerns addressed.   Objective:  Vital signs in last 24 hours: Vitals:   07/09/19 2359 07/10/19 0407 07/10/19 0450 07/10/19 0504  BP: (!) 142/56  (!) 145/50   Pulse: (!) 54 (!) 50 (!) 51   Resp: 20 (!) 25 19   Temp: 97.6 F (36.4 C)     TempSrc: Axillary     SpO2: 99% 96% 97%   Weight:    109 kg  Height:       Physical Exam Vitals signs and nursing note reviewed.  Constitutional:      General: She is not in acute distress.    Appearance: She is obese.  Pulmonary:     Effort: Pulmonary effort is normal. No accessory muscle usage or respiratory distress.     Breath sounds: Normal breath sounds. Decreased breath sounds: improved today.  Chest:     Chest wall: No tenderness, crepitus or edema.  Skin:    Comments: Signs of venous stasis in bilateral lower extremities,chronic. Unchanged examination from prior.   Neurological:     General: No focal deficit present.     Mental Status: She is alert and oriented to person, place, and time.    Assessment/Plan:  [name] is a 84 y/o female with PMHx of HTN, CKD IV, CHF, OSA and A.fib presenting with sudden onset dyspnea in setting of increased O2 requirement for 1 week after cardioversion she was found to have acute hypoxic respiratory failure.   Acute hypoxic respiratory failure Patient has a history of HFpEF (Echo 01/2017 with EF 65-70%). She is on an alternating schedule of 40mg  and 20mg  of Lasix daily and reports compliance prior to admission.  Initial chest xray at the ED (10/26) showed diffuse bilateral airspace opacities, likely edema vs pneumonia. CT on 10/26  showed widespread ground glass opacities bilaterally with airway thickening and scattered secretions, possibly ARDS vs infection vs edema. Patient has completed 5 day course of doxycycline and is on last day of cefepime.  She uses BiPAP at night to sleep and was still on BiPAP 50% FiO2 this morning when assessed by the team. On reassessment, patient is on 11L of NRB and is tolerating it well with O2 sats >92%. Goal is to have patient down to 4L [place] for discharge.  - PCCM on board - Continue Cefepime Day 7/7 - Solumedrol 40mg  IV qd per PCCM - Duoneb 71ml q4h  - BiPAP prn  - Strict I/O's and daily weights  QTc Prolongation:  Patient with recent cardioversion for atypical atrial flutter. Her initial EKG on presentation with QTc 496. Highest during admission so far was 595 with most recent being 568ms. Continue to monitor with intermittent EKGs. Cardiology recommendations to continue with diltiazem at this time and recommend continued Eliquis.  - Intermittent EKGs - Cardiac monitoring - Diltiazem 180mg  qd - Avoid QT prolonging agents   Thrombocytopenia in setting of chronic ITP Patient has a history of chronic ITP for which she receives weekly N-plate infusions and last one was on 10/23 in the outpatient setting. Her platelets continue to improve with most recent plt count of 126. Pateitn seen by Dr.  Ennever this morning, appreciate his recs. Suspect her acute thrombocytopenia was secondary to her Klebsiella UTI. We will continue to monitor.  - CBC daily   CKD Stage IV:  Cr slightly improved today at 2.49 after free water repletion and holding Lasix. Hypernatremia also improved.  - Continue free water repletion - Avoid nephrotoxic agents - BMP in the A.M.   Klebsiella UTI Urine cultures drawn on admission are currently growing Klebsiella that is sensitive to everything but Ampicillin. This could reflect a chronic colonization instead of an acute issue, as her last urine cultures were back in  2017. Remains asymptomatic during admission. No intervention necessary, as she is already on antibiotics for respiratory failure.    Dispo: Anticipated discharge pending clinical improvement.    Harvie Heck, MD Internal Medicine PGY-1  Pager: [phone] 07/10/2019, 7:31 AM

## 2019-07-10 NOTE — Discharge Instructions (Addendum)
[name], [name] were admitted for acute hypoxic respiratory failure and were given oxygen support and treated with high dose steroids. On discharge, your steroids will continue to be tapered as follows:   Day 1: 35mg  Day 2: 35mg  Day 3: 35mg  Day 4: 30mg  Day 5:30 mg Day 6: 30mg   Day 7: 30 mg Day 8:  30mg  Day 9: 25 mg  Day 10: 25 mg  Day 11: 25 mg  Day 12: 25 mg  Day 13: 25 mg  Day 14: 20 mg Day 15: 20 mg Day 16: 20 mg Day 17: 20 mg Day 18: 20 mg Day 19: 15 mg Day 20: 15 mg Day 21: 15 mg Day 22: 15 mg Day 23: 15 mg  Day 24: 10 mg Day 25: 10 mg Day 26: 10 mg Day 27: 10 mg Day 28: 10 mg Day 29:  5 mg Day 30:  5 mg Day 31: 5 mg Day 32: 5 mg  Day 33: 5 mg  Continue to use oxygen as required.

## 2019-07-10 NOTE — Evaluation (Signed)
Occupational Therapy Evaluation Patient Details Name: [name] MRN: [id number] DOB: 1934/10/06 Today's Date: 07/10/2019    History of Present Illness 84yo F with extensive past medical history presented SOB with increased oxygen demands, requiring initiation of BIPAP therapy. Patient has been unable to wean from NIV therapy over 2 days.  PCCM consulted for further management.    Clinical Impression   Patient admitted for above and limited by problem list below, including impaired balance, decreased activity tolerance and generalized weakness. PTA reports using rollator for mobility and completing ADls some some assist from assisted living staff.  Patient currently requires min assist for UB ADLs, LB ADLs with max assist, transfers with min assist using rollator.  Patient will benefit from continued OT services while admitted and after dc at SNF level (at [facility]) in order to maximize independence and tolerance for daily ADLs.     Follow Up Recommendations  SNF;Supervision/Assistance - 24 hour    Equipment Recommendations  Other (comment)(TBD at next venue of care)    Recommendations for Other Services       Precautions / Restrictions Precautions Precautions: Fall Restrictions Weight Bearing Restrictions: No      Mobility Bed Mobility               General bed mobility comments: Pt up in recliner  Transfers Overall transfer level: Needs assistance Equipment used: 4-wheeled walker Transfers: Sit to/from Stand Sit to Stand: Min guard;Min assist         General transfer comment: Pt performed x 5. Initial min assist to bring hips up and progressed to min guard. Verbal cues for hand placement.     Balance Overall balance assessment: Needs assistance Sitting-balance support: No upper extremity supported;Feet supported Sitting balance-Leahy Scale: Fair     Standing balance support: Bilateral upper extremity supported;During functional activity Standing  balance-Leahy Scale: Poor Standing balance comment: min guard for safety and balance, reliant on BUE support                           ADL either performed or assessed with clinical judgement   ADL Overall ADL's : Needs assistance/impaired     Grooming: Minimal assistance;Sitting   Upper Body Bathing: Minimal assistance;Sitting   Lower Body Bathing: Maximal assistance;Sit to/from stand   Upper Body Dressing : Minimal assistance;Sitting   Lower Body Dressing: Maximal assistance;Sit to/from stand   Toilet Transfer: Minimal assistance Armed forces technical officer Details (indicate cue type and reason): simulated in room         Functional mobility during ADLs: Minimal assistance;+2 for physical assistance;+2 for safety/equipment(rollator) General ADL Comments: patient limited by weakness, decreased activity tolerance, impaired balance     Vision   Vision Assessment?: No apparent visual deficits     Perception     Praxis      Pertinent Vitals/Pain Pain Assessment: No/denies pain     Hand Dominance Right   Extremity/Trunk Assessment Upper Extremity Assessment Upper Extremity Assessment: Generalized weakness   Lower Extremity Assessment Lower Extremity Assessment: Defer to PT evaluation       Communication Communication Communication: No difficulties   Cognition Arousal/Alertness: Awake/alert Behavior During Therapy: Flat affect Overall Cognitive Status: Within Functional Limits for tasks assessed                                     General Comments  pt on  non-rebreather throughout session, SpO2 maintained     Exercises     Shoulder Instructions      Home Living Family/patient expects to be discharged to:: Assisted living([facility] [facility] ) Living Arrangements: Alone Available Help at Discharge: Available PRN/intermittently(apartment across from the nursing station) Type of Home: Apartment       Home Layout: One level     Bathroom  Shower/Tub: Hospital doctor Toilet: Handicapped height     Home Equipment: Environmental consultant - 2 wheels;Walker - 4 wheels;Cane - single point;Grab bars - toilet;Grab bars - tub/shower;Shower seat;Hand held shower head   Additional Comments: sleeps in lift chair      Prior Functioning/Environment Level of Independence: Needs assistance  Gait / Transfers Assistance Needed: used rollator on her own ADL's / Homemaking Assistance Needed: Bahting and dressing with some  assist from staff            OT Problem List: Decreased strength;Decreased activity tolerance;Impaired balance (sitting and/or standing);Decreased safety awareness;Decreased knowledge of use of DME or AE;Decreased knowledge of precautions;Cardiopulmonary status limiting activity;Impaired UE functional use      OT Treatment/Interventions: Self-care/ADL training;DME and/or AE instruction;Therapeutic exercise;Balance training;Patient/family education;Therapeutic activities    OT Goals(Current goals can be found in the care plan section) Acute Rehab OT Goals Patient Stated Goal: to go back to A living OT Goal Formulation: With patient Time For Goal Achievement: 07/24/19 Potential to Achieve Goals: Good  OT Frequency: Min 2X/week   Barriers to D/C:            Co-evaluation              AM-PAC OT "6 Clicks" Daily Activity     Outcome Measure Help from another person eating meals?: A Little Help from another person taking care of personal grooming?: A Little Help from another person toileting, which includes using toliet, bedpan, or urinal?: A Lot Help from another person bathing (including washing, rinsing, drying)?: A Lot Help from another person to put on and taking off regular upper body clothing?: A Little Help from another person to put on and taking off regular lower body clothing?: A Lot 6 Click Score: 15   End of Session Equipment Utilized During Treatment: Other (comment)(rollator) Nurse  Communication: Mobility status  Activity Tolerance: Patient limited by fatigue Patient left: in chair;with call bell/phone within reach;with chair alarm set  OT Visit Diagnosis: Other abnormalities of gait and mobility (R26.89);Muscle weakness (generalized) (M62.81)                Time: 8338-2505 OT Time Calculation (min): 28 min Charges:  OT General Charges $OT Visit: 1 Visit OT Evaluation $OT Eval Moderate Complexity: [place], OT Acute Rehabilitation Services Pager [phone] Office [phone]   Delight [place] 07/10/2019, 4:28 PM

## 2019-07-10 NOTE — Progress Notes (Signed)
NAME:  [name], MRN:  [id number], DOB:  [birth year], LOS: 7 ADMISSION DATE:  07/03/2019, CONSULTATION DATE:  10/28 REFERRING MD:  Dr. Charleen Kirks FPTS, CHIEF COMPLAINT:  Prolonged respiratory failure   Brief History   84yo F with extensive past medical history presented SOB with increased oxygen demands, requiring initiation of BIPAP therapy. Patient has been unable to wean from NIV therapy over 2 days.  PCCM consulted for further management.   History of present illness   Mrs. Quesenberry is an 84yo female with extensive past medical history including OSA, chronic hypoxic  respiratory failure, CKD, and diastolic congestive heart failure who presented for local ALF with progressive shortness of breath requiring initiation of BIPAP therapy and has been unable to wean NIV since arrival 36hrs ago.   Patient underwent successful cardioversion 10/19 and was unable to wean from supplemental oxygen at time of discharge. She was discharged back to ALF with supplemental oxygen and has had progressive need for higher levels of oxygen since discharge. Per daughter patient was diagnosed with viral pneumonia at ALF but when dyspnea worsened patient was brought to ED. Daughter reported increased lower extremity edema prior to admission. Denied any fever, chills, weight changes, chest pain, cough, nausea/vomiting, or urinary symptoms.   On arrival patient was seen with increased work of breathing, diaphoretic, with shortness of breath requiring application of BiPAP. CT chest with widespread ground-glass throughout both lungs, small bilateral effusions, cardiomegaly with enlargement of main pulmonary artery. Also noted progressive incline of troponin since arrival.   PCCM consulted for further management.  Past Medical History  Hypertension Type 2 diabetes Syncope Chronic hypoxic respiratory failure OSA on CPAP Obesity  Asthma  Depression Anxiety  Significant Hospital Events   10/26 Admitted   Consults:  PCCM  Procedures:    Significant Diagnostic Tests:  CT Chest 10/27 >> widespread ground-glass throughout both lungs, small bilateral effusions, cardiomegaly with enlargement of main pulmonary artery.  Micro Data:  COVID 10/26 >> neg Urine culture 10/26 >> Klebsilla pna, resistant to ampicillin Blood culture   Antimicrobials:  10/26 Cefepime >>11/2 10/26 Vancomycin >>10/30 Doxycycline 10/28 >>10/30  Interim history/subjective:  Continues to require Bipap intermittently.  Currently on NRB.  Tolerated 6812-7517 off Bipap yesterday.   Objective   Blood pressure (!) 137/93, pulse 62, temperature (!) 97.5 F (36.4 C), temperature source Oral, resp. rate 20, height '5\' 1"'$  (1.549 m), weight 109 kg, SpO2 93 %.    FiO2 (%):  [50 %-100 %] 50 %   Intake/Output Summary (Last 24 hours) at 07/10/2019 1325 Last data filed at 07/10/2019 0504 Gross per 24 hour  Intake 616.39 ml  Output 600 ml  Net 16.39 ml   Filed Weights   07/07/19 0614 07/08/19 0645 07/10/19 0504  Weight: 111.5 kg 112.4 kg 109 kg    Examination: GEN: Obese female sitting in chair HEENT: NRB. [place]/AT CV: Warm and well perfused, no edema.  S1S2 PULM: Distant breath sounds. Symmetric expansion, no wheezing GI:  Soft, non tender, non distended, + bowel sounds NEURO: alert and oriented, no focal deficits. PT at bedside  SKIN: Age-related changes    Resolved Hospital Problem list     Assessment & Plan:   Acute on chronic hypoxic and hypercapnic respiratory failure History of obstructive sleep apnea-was compliant with CPAP use at home --CT Chest: Widespread ground glass opacities --Slow improvement throughout the weekend requiring less Bipap than before. Extremely high risk for bronchoscopy. Plan: --Empiric abx: Cefepime x 7 days to be  completed today.  --Steroids: Will need slow tapering. Weaned 11/1.  --Scheduled bronchodilators --ESR: 120  --ANCA/RF: Pending --Creatinine down trending with holding  Diuresis: Net negative -6L. Diuretics held since 10/31 --Concern etiology alveolar hemorrhage, ongoing dark sputum production. Will need to monitor closely and if has respiratory deterioration will need bronch. Autoimmune work up in progress.   Chronic ITP  --recently received colony stimulating factor, no know association with this and pneumonitis.   DM-  --Basal and scheduled SSI  Afib- HTN --Low dose Eliquis restarted --Antihypertensives per primary team  CKD stage IV Klebsiella UTI --No urinary tract infection symptoms  Hypernatremia --FWF  Anxiety --Home zoloft  Paulita Fujita, ACNP [facility]  Pager: [phone]

## 2019-07-10 NOTE — Progress Notes (Signed)
Thankfully, Ms. Curley seems to be improving.  Her platelet count is now 126,000.  This keeps coming up gradually.  I think this is reflective of the improvement of her Klebsiella urinary tract infection.  She is not bleeding.  We will continue her on the low-dose Eliquis.  I am not sure that she is eating all that much.  Her white cell count is 8.8.  Her hemoglobin is 9.2.  This is come up quite nicely.  She was given 1 unit of blood on 07/07/2019.  We will probably give another dose of Nplate tomorrow.  She had a chest x-ray done today.  Results are pending.  Her vital signs are all stable.  Temperature is 97.6.  Pulse 51.  Blood pressure 145/50.  Her cardiac exam is slow but regular.  Her lungs sound pretty clear bilaterally.  She seems to have good air movement bilaterally.  Abdomen is soft.  She has decent bowel sounds.  She is wearing BiPAP but seems to be less and less.  I am just happy that the thrombocytopenia is improving.  Again, I had believe that this is all reflective of this gram-negative rod urinary tract infection that she had.  We will continue to follow along.  I know she is getting fantastic care from all the staff up on [address].  Worthy Keeler, MD  Psalm 100:3

## 2019-07-11 DIAGNOSIS — A419 Sepsis, unspecified organism: Secondary | ICD-10-CM

## 2019-07-11 DIAGNOSIS — R358 Other polyuria: Secondary | ICD-10-CM

## 2019-07-11 DIAGNOSIS — E119 Type 2 diabetes mellitus without complications: Secondary | ICD-10-CM

## 2019-07-11 DIAGNOSIS — D649 Anemia, unspecified: Secondary | ICD-10-CM

## 2019-07-11 LAB — ANCA TITERS
Atypical P-ANCA titer: <1:20 {titer}
C-ANCA: <1:20 {titer}
P-ANCA: <1:20 {titer}

## 2019-07-11 LAB — GLUCOSE, CAPILLARY
Glucose-Capillary: 243 mg/dL — ABNORMAL HIGH (ref 70–99)
Glucose-Capillary: 245 mg/dL — ABNORMAL HIGH (ref 70–99)
Glucose-Capillary: 185 mg/dL — ABNORMAL HIGH (ref 70–99)
Glucose-Capillary: 268 mg/dL — ABNORMAL HIGH (ref 70–99)

## 2019-07-11 LAB — COMPREHENSIVE METABOLIC PANEL
ALT: 12 U/L (ref 0–44)
AST: 22 U/L (ref 15–41)
Albumin: 3 g/dL — ABNORMAL LOW (ref 3.5–5.0)
Alkaline Phosphatase: 41 U/L (ref 38–126)
Anion gap: 10 (ref 5–15)
BUN: 102 mg/dL — ABNORMAL HIGH (ref 8–23)
CO2: 30 mmol/L (ref 22–32)
Calcium: 10.5 mg/dL — ABNORMAL HIGH (ref 8.9–10.3)
Chloride: 104 mmol/L (ref 98–111)
Creatinine, Ser: 2.28 mg/dL — ABNORMAL HIGH (ref 0.44–1.00)
GFR calc Af Amer: 23 mL/min — ABNORMAL LOW (ref >60)
GFR calc non Af Amer: 19 mL/min — ABNORMAL LOW (ref >60)
Glucose, Bld: 288 mg/dL — ABNORMAL HIGH (ref 70–99)
Potassium: 5 mmol/L (ref 3.5–5.1)
Sodium: 144 mmol/L (ref 135–145)
Total Bilirubin: 1 mg/dL (ref 0.3–1.2)
Total Protein: 6 g/dL — ABNORMAL LOW (ref 6.5–8.1)

## 2019-07-11 LAB — CBC
HCT: 29.2 % — ABNORMAL LOW (ref 36.0–46.0)
Hemoglobin: 9.1 g/dL — ABNORMAL LOW (ref 12.0–15.0)
MCH: 29.3 pg (ref 26.0–34.0)
MCHC: 31.2 g/dL (ref 30.0–36.0)
MCV: 93.9 fL (ref 80.0–100.0)
Platelets: 130 10*3/uL — ABNORMAL LOW (ref 150–400)
RBC: 3.11 MIL/uL — ABNORMAL LOW (ref 3.87–5.11)
RDW: 17.3 % — ABNORMAL HIGH (ref 11.5–15.5)
WBC: 8.8 10*3/uL (ref 4.0–10.5)
nRBC: 0 % (ref 0.0–0.2)

## 2019-07-11 LAB — RHEUMATOID FACTOR: Rheumatoid fact SerPl-aCnc: 15.6 IU/mL — ABNORMAL HIGH (ref 0.0–13.9)

## 2019-07-11 MED ORDER — ROMIPLOSTIM 250 MCG ~~LOC~~ SOLR
1.0000 ug/kg | Freq: Once | SUBCUTANEOUS | Status: AC
  Administered 2019-07-11: 110 ug via SUBCUTANEOUS
  Filled 2019-07-11: qty 0.22

## 2019-07-11 MED ORDER — DARBEPOETIN ALFA 300 MCG/0.6ML IJ SOSY
300.0000 ug | PREFILLED_SYRINGE | Freq: Once | INTRAMUSCULAR | Status: AC
  Administered 2019-07-11: 300 ug via SUBCUTANEOUS
  Filled 2019-07-11: qty 0.6

## 2019-07-11 NOTE — Progress Notes (Signed)
NAME:  TEWANA BOHLEN, MRN:  [id number], DOB:  1934/09/26, LOS: 8 ADMISSION DATE:  07/03/2019, CONSULTATION DATE:  10/28 REFERRING MD:  Dr. Charleen Kirks FPTS, CHIEF COMPLAINT:  Prolonged respiratory failure   Brief History   84yo F with extensive past medical history presented SOB with increased oxygen demands, requiring initiation of BIPAP therapy. Patient has been unable to wean from NIV therapy over 2 days.  PCCM consulted for further management.   History of present illness   Mrs. Colaizzi is an 84yo female with extensive past medical history including OSA, chronic hypoxic  respiratory failure, CKD, and diastolic congestive heart failure who presented for local ALF with progressive shortness of breath requiring initiation of BIPAP therapy and has been unable to wean NIV since arrival 36hrs ago.   Patient underwent successful cardioversion 10/19 and was unable to wean from supplemental oxygen at time of discharge. She was discharged back to ALF with supplemental oxygen and has had progressive need for higher levels of oxygen since discharge. Per daughter patient was diagnosed with viral pneumonia at ALF but when dyspnea worsened patient was brought to ED. Daughter reported increased lower extremity edema prior to admission. Denied any fever, chills, weight changes, chest pain, cough, nausea/vomiting, or urinary symptoms.   On arrival patient was seen with increased work of breathing, diaphoretic, with shortness of breath requiring application of BiPAP. CT chest with widespread ground-glass throughout both lungs, small bilateral effusions, cardiomegaly with enlargement of main pulmonary artery. Also noted progressive incline of troponin since arrival.   PCCM consulted for further management.  Past Medical History  Hypertension Type 2 diabetes Syncope Chronic hypoxic respiratory failure OSA on CPAP Obesity  Asthma  Depression Anxiety  Significant Hospital Events   10/26 Admitted   Consults:  PCCM  Procedures:    Significant Diagnostic Tests:  CT Chest 10/27 >> widespread ground-glass throughout both lungs, small bilateral effusions, cardiomegaly with enlargement of main pulmonary artery.  Micro Data:  COVID 10/26 >> neg Urine culture 10/26 >> Klebsilla pna, resistant to ampicillin Blood culture   Antimicrobials:  10/26 Cefepime >>11/2 10/26 Vancomycin >>10/30 Doxycycline 10/28 >>10/30  Interim history/subjective:  Tolerating off bipap for extended periods during the day.  Currently on NRB.  No specific c/o.   Objective   Blood pressure (!) 162/57, pulse (!) 58, temperature (!) 97.4 F (36.3 C), temperature source Oral, resp. rate 16, height 5\' 1"  (1.549 m), weight 108.5 kg, SpO2 97 %.    FiO2 (%):  [50 %-100 %] 50 %   Intake/Output Summary (Last 24 hours) at 07/11/2019 1149 Last data filed at 07/11/2019 0900 Gross per 24 hour  Intake 483 ml  Output 800 ml  Net -317 ml   Filed Weights   07/08/19 0645 07/10/19 0504 07/11/19 0732  Weight: 112.4 kg 109 kg 108.5 kg    Examination: GEN: Obese female NAD  HEENT: NRB. [place]/AT CV: Warm and well perfused, no edema.  S1S2 PULM: resps even non labored on NRB.  Crackles throughout  GI:  Soft, non tender, non distended, + bowel sounds NEURO: alert and oriented, no focal deficits. PT at bedside  SKIN: Age-related changes   Resolved Hospital Problem list     Assessment & Plan:   Acute on chronic hypoxic and hypercapnic respiratory failure History of obstructive sleep apnea-was compliant with CPAP use at home --CT Chest: Widespread ground glass opacities --Slow improvement throughout the weekend requiring less Bipap than before.  Resp status not conducive to bronchoscopy.  Plan: S/p  7 days cefepime  Continue low dose IV steroids and wean slowly as tolerated - last weaned 11/1 Continue scheduled BD  ANA, ANCA neg, RF very mildly elevated  Holding further diuresis for now    Chronic ITP   --recently received colony stimulating factor, no know association with this and pneumonitis.  - Dr. Marin Olp following   DM-  --Basal and scheduled SSI  Afib- HTN --Low dose Eliquis restarted --Antihypertensives per primary team  CKD stage IV Klebsiella UTI --No urinary tract infection symptoms  Hypernatremia --FWF  Anxiety --Home zoloft   Discussed with daughter at bedside 11/3.   Nickolas Madrid, NP 07/11/2019  11:49 AM Pager: [phone] or [phone]

## 2019-07-11 NOTE — Progress Notes (Signed)
RT NOTES: Patient removed from bipap and placed on 100% NRB. Will continue to monitor.

## 2019-07-11 NOTE — Progress Notes (Addendum)
Subjective:   [name] assessed at bedside this morning. She is on NRB this morning and tolerating it well. Per daughter, patient was able to get out of bed yesterday and sit in the chair for several hours with non-rebreather.   Patient on BiPAP overnight. At baseline, she uses CPAP at night. Will try patient on CPAP trial tonight.   Daughter at bedside. Discussed with her regarding patient's clinical improvement and goals for discharge. All questions and concerns addressed.   Objective:  Vital signs in last 24 hours: Vitals:   07/10/19 2058 07/11/19 0134 07/11/19 0400 07/11/19 0637  BP: (!) 143/49 (!) 131/51  (!) 145/67  Pulse: (!) 57 (!) 50  (!) 51  Resp: 20 20 20 20   Temp:      TempSrc:      SpO2: (!) 87% 96% 98% 95%  Weight:      Height:       Physical Exam Vitals signs and nursing note reviewed.  Constitutional:      General: She is not in acute distress.    Appearance: She is obese.  Pulmonary:     Effort: Pulmonary effort is normal. No accessory muscle usage or respiratory distress.     Breath sounds: Normal breath sounds. Decreased breath sounds: improved today.  Chest:     Chest wall: No tenderness, crepitus or edema.  Skin:    Comments: Signs of venous stasis in bilateral lower extremities,chronic. Unchanged examination from prior.   Neurological:     General: No focal deficit present.     Mental Status: She is alert and oriented to person, place, and time.    Assessment/Plan:  [name] is a 84 y/o female with PMHx of HTN, CKD IV, CHF, OSA and A.fib presenting with sudden onset dyspnea in setting of increased O2 requirement for 1 week after cardioversion she was found to have acute hypoxic respiratory failure.   Acute hypoxic respiratory failure Patient has a history of HFpEF (Echo 01/2017 with EF 65-70%). She is on an alternating schedule of 40mg  and 20mg  of Lasix daily and reports compliance prior to admission.  Initial chest xray at the ED  (10/26) showed diffuse bilateral airspace opacities, likely edema vs pneumonia. CT on 10/26 showed widespread ground glass opacities bilaterally with airway thickening and scattered secretions, possibly ARDS vs infection vs edema. Patient has completed doxycycline and cefepime. She was started on steroid taper by PCCM yesterday. Patient tolerated out of bed yesterday on NRB and was able to participate in PT/OT. She uses BiPAP at night to sleep. This morning, patient is on NRB and tolerating it well. Goal is to have patient down to 4L [place] for discharge.  Concerns for diffuse alveolar hemorrhage on CT chest; patient reports minimal dark colored sputum today. However, she is not good candidate for bronchoscopy at this point.  - PCCM on board - Prednisone 40mg  qd with taper - Duoneb 67ml q4h  - Trial of CPAP overnight and BiPAP prn  - Incentive spirometry - Strict I/O's and daily weights  QTc Prolongation:  Patient with recent cardioversion for atypical atrial flutter. Her initial EKG on presentation with QTc 496 with most recent one of 568ms. Cardiology recommendations to continue with diltiazem at this time and recommend continued Eliquis.  Will continue to monitor with serial EKGs.  - Cardiac monitoring - Diltiazem 180mg  qd - Avoid QT prolonging agents   Thrombocytopenia in setting of chronic ITP Patient has a history of chronic ITP for which  she receives weekly N-plate infusions and last one was on 10/23 in the outpatient setting. Her platelets continue to improve with most recent plt count of 130.  Patient will receive another dose of N-plate today per Dr. Marin Olp. Appreciate his recs.  - CBC daily   CKD Stage IV:  Cr continues to improve with free water repletion and holding Lasix. Hypernatremia resolved. - Continue free water repletion - Avoid nephrotoxic agents - BMP daily  Klebsiella UTI Patient has completed course of antibiotics and is currently asymptomatic. No need for repeat UA at  this time.   Dispo: Anticipated discharge pending clinical improvement.    Sara Heck, MD Internal Medicine PGY-1  Pager: [phone] 07/11/2019, 7:13 AM

## 2019-07-11 NOTE — Progress Notes (Signed)
[name] is making slow but steady progress.  Her BUN and creatinine are 102 and 2.28.  I suppose this probably is from diuresis.  Her chest x-ray yesterday looked relatively stable.  She still is on the BiPAP.  They are trying to wean her down but this is been difficult.  Her platelet count is 130,000.  I will give her another dose of Nplate today.  She is back on low-dose Eliquis.  I think this is reasonable.  Her hemoglobin is 9.1.  We will see about a dose of Aranesp also.  She is still on liquids.  I think she had a bowel movement but is not diarrhea.  There is been no problems with fever.  She has been treated for the Klebsiella UTI.  I would recheck her urine.  Her vital signs all look pretty stable.  Her blood pressure is 145/67.  Pulse is 51.  Temperature 98.4.  Her head exam shows no thrush.  There is no adenopathy in the neck.  Lungs show relatively decent breath sounds bilaterally.  Cardiac exam regular rate and rhythm.  Abdomen is soft.  She is mildly obese.  Bowel sounds are slightly decreased.  Hopefully, this whole "sepsis" situation is resolving.  I think everything will look to be dictated by her pulmonary function.  Hopefully her chest x-ray will continue to improve and her oxygen levels will improve so she can come off the BiPAP.  We will continue to work on her platelets.  We will also see if the Aranesp can help with her red cells.  I appreciate all the great care that she is getting from all the staff up on 3 E.   Kerby Nora, MD  Daisy Lazar 3:17

## 2019-07-11 NOTE — Progress Notes (Signed)
Pt placed on BIPAP V60 for the night. Pt requested to go on at this time. Pt respiratory status is stable on the BIPAP at this time. RT will continue to monitor.

## 2019-07-11 NOTE — Progress Notes (Signed)
Pt. Family requesting for pt. To be placed on BPAP. RT made aware. RN at pts. Bedside to administer meds. RN informed by RT that she would return to place pt. On BPAP 1 hr after medication administration to reduce aspiration risk. RN will continue to monitor pt.

## 2019-07-11 NOTE — Progress Notes (Signed)
  Speech Language Pathology Treatment: Dysphagia  Patient Details Name: AYA GEISEL MRN: [id number] DOB: 26-Nov-1934 Today's Date: 07/11/2019 Time: 1049-1100 SLP Time Calculation (min) (ACUTE ONLY): 11 min  Assessment / Plan / Recommendation Clinical Impression  Pt continues with high O2 requirements.  RN verbally approved removal of nonrebreather mask for PO trials.  Pt tolerated thin liquid by straw.  There was a single delayed cough.  With trial of soft solid texture, pt desaturated to 76, but exhibited adequate oral clearance and no overt s/s of pharyngeal dysphagia.  NRB was removed for only a few minutes.  Pt is not appropriate for instrumental testing or advanced solids at this time 2/2 O2 requirements.  Daughter reports very limited intake of full liquid diet.  Consider RD consult to maximize PO nutrition with limited/modified diet. SLP will continue to follow for diet tolerance and possible advancement.   HPI HPI: Ms. Keighley Deckman is a 84 y/o female with PMHx of HTN, CKD IV, CHF, OSA and A.fib presenting with sudden onset dyspnea in setting of increased O2 requirement for 1 week after cardioversion. CXR concerning for pulmonary edema vs bilateral pneumonia. Initial ABG with respiratory acidosis. MD concerned for alveolar hemorrhage. No infectious symptoms.       SLP Plan  Continue with current plan of care       Recommendations  Diet recommendations: Thin liquid(Full liquid diet) Medication Administration: Whole meds with puree Supervision: Staff to assist with self feeding Compensations: Slow rate;Small sips/bites Postural Changes and/or Swallow Maneuvers: Seated upright 90 degrees                General recommendations: (RD Consult) Oral Care Recommendations: Oral care QID Follow up Recommendations: Skilled Nursing facility SLP Visit Diagnosis: Dysphagia, oropharyngeal phase (R13.12) Plan: Continue with current plan of care       [place], [place], [place] Office: [phone] 07/11/2019, 11:07 AM

## 2019-07-12 DIAGNOSIS — D696 Thrombocytopenia, unspecified: Secondary | ICD-10-CM

## 2019-07-12 DIAGNOSIS — Z9989 Dependence on other enabling machines and devices: Secondary | ICD-10-CM

## 2019-07-12 LAB — URINE CULTURE: Culture: NO GROWTH

## 2019-07-12 LAB — BASIC METABOLIC PANEL
Anion gap: 10 (ref 5–15)
BUN: 97 mg/dL — ABNORMAL HIGH (ref 8–23)
CO2: 35 mmol/L — ABNORMAL HIGH (ref 22–32)
Calcium: 11 mg/dL — ABNORMAL HIGH (ref 8.9–10.3)
Chloride: 102 mmol/L (ref 98–111)
Creatinine, Ser: 2.23 mg/dL — ABNORMAL HIGH (ref 0.44–1.00)
GFR calc Af Amer: 23 mL/min — ABNORMAL LOW (ref >60)
GFR calc non Af Amer: 20 mL/min — ABNORMAL LOW (ref >60)
Glucose, Bld: 235 mg/dL — ABNORMAL HIGH (ref 70–99)
Potassium: 4.9 mmol/L (ref 3.5–5.1)
Sodium: 147 mmol/L — ABNORMAL HIGH (ref 135–145)

## 2019-07-12 LAB — CBC
HCT: 30.5 % — ABNORMAL LOW (ref 36.0–46.0)
Hemoglobin: 9.4 g/dL — ABNORMAL LOW (ref 12.0–15.0)
MCH: 29.1 pg (ref 26.0–34.0)
MCHC: 30.8 g/dL (ref 30.0–36.0)
MCV: 94.4 fL (ref 80.0–100.0)
Platelets: 144 10*3/uL — ABNORMAL LOW (ref 150–400)
RBC: 3.23 MIL/uL — ABNORMAL LOW (ref 3.87–5.11)
RDW: 17.6 % — ABNORMAL HIGH (ref 11.5–15.5)
WBC: 11.6 10*3/uL — ABNORMAL HIGH (ref 4.0–10.5)
nRBC: 0 % (ref 0.0–0.2)

## 2019-07-12 LAB — GLUCOSE, CAPILLARY
Glucose-Capillary: 204 mg/dL — ABNORMAL HIGH (ref 70–99)
Glucose-Capillary: 387 mg/dL — ABNORMAL HIGH (ref 70–99)
Glucose-Capillary: 369 mg/dL — ABNORMAL HIGH (ref 70–99)

## 2019-07-12 MED ORDER — ENSURE ENLIVE PO LIQD
237.0000 mL | Freq: Three times a day (TID) | ORAL | Status: DC
  Administered 2019-07-12 – 2019-07-18 (×18): 237 mL via ORAL

## 2019-07-12 MED ORDER — SALINE SPRAY 0.65 % NA SOLN
1.0000 | NASAL | Status: DC | PRN
  Filled 2019-07-12: qty 44

## 2019-07-12 MED ORDER — FREE WATER
350.0000 mL | Freq: Three times a day (TID) | Status: DC
  Administered 2019-07-12 – 2019-07-18 (×17): 350 mL via ORAL

## 2019-07-12 MED ORDER — PRO-STAT SUGAR FREE PO LIQD
30.0000 mL | Freq: Three times a day (TID) | ORAL | Status: DC
  Administered 2019-07-12 – 2019-07-18 (×13): 30 mL via ORAL
  Filled 2019-07-12 (×15): qty 30

## 2019-07-12 MED ORDER — PREDNISONE 20 MG PO TABS
40.0000 mg | ORAL_TABLET | Freq: Every day | ORAL | Status: AC
  Administered 2019-07-12 – 2019-07-16 (×5): 40 mg via ORAL
  Filled 2019-07-12 (×5): qty 2

## 2019-07-12 NOTE — Progress Notes (Signed)
Initial Nutrition Assessment  DOCUMENTATION CODES:   Obesity unspecified  INTERVENTION:  Ensure Enlive TID each supplement provides 350 calories and 20g protein.  Hormel Shake TID each supplement provides 520 calories and 22g protein.   Magic cup TID each supplement provides 290 calories and 9g protein.  Multivitamin with minerals daily.  ProStat TID each supplement provides 100 calories and 15g protein.   NUTRITION DIAGNOSIS:   Inadequate oral intake related to poor appetite as evidenced by per patient/family report   GOAL:   Patient will meet greater than or equal to 90% of their needs   MONITOR:   PO intake, Supplement acceptance, Diet advancement, Labs, Weight trends, Skin, I & O's  REASON FOR ASSESSMENT:   Consult Assessment of nutrition requirement/status  ASSESSMENT:   84 yo female admitted for SOB. PMH of CKD, HTN, CHF, afib, DM type II, HF, hypothyroidism, OSA. Currenty requiring intermittent BiPAP.  Pt with flat affect History obtained from pt and her daughter with pt permission. States that her appetite has been waning for a few months prior to admission due to loss of taste and loss of socialization at her [facility] due to COVID-19 restrictions. States that when she had a good appetite, she would eat breakfast, lunch and dinner and would cook some, but often go to the dining hall at the assisted living facility to get a panini or some other food. Daughter states that even her favorite foods such as cheese no longer sound appetizing to Ms. Sandeen. She has been able to consume about 50% of the Ensure Enlive given to her, and will try supplements provided, but will not finish them because of her poor appetite. Pt states that her UBW is around 250# which is consistent with her wt history. Daughter states that she doesn't think she has lost any weight despite her decrease in appetite She is -4% body weight since admission, but this is likely attributable to diuresis of 6.8  L of fluid. Daughter appreciative of RD interventions, will continue to encourage pt to increase intake.   NUTRITION - FOCUSED PHYSICAL EXAM:    Most Recent Value  Orbital Region  No depletion  Upper Arm Region  No depletion  Thoracic and Lumbar Region  No depletion  Buccal Region  No depletion  Temple Region  No depletion  Clavicle Bone Region  No depletion  Clavicle and Acromion Bone Region  No depletion  Scapular Bone Region  No depletion  Dorsal Hand  Mild depletion  Patellar Region  No depletion  Anterior Thigh Region  No depletion  Posterior Calf Region  No depletion  Edema (RD Assessment)  Mild  Hair  Reviewed  Eyes  Reviewed  Mouth  Reviewed  Skin  Reviewed  Nails  Reviewed       Diet Order:   Diet Order            Diet full liquid Room service appropriate? Yes; Fluid consistency: Thin  Diet effective now              EDUCATION NEEDS:   Education needs have been addressed  Skin:  Skin Assessment: Skin Integrity Issues: Skin Integrity Issues:: Stage I Stage I: buttock  Last BM:  unknown  Height:   Ht Readings from Last 1 Encounters:  07/06/19 5\' 1"  (1.549 m)    Weight:   Wt Readings from Last 1 Encounters:  07/12/19 109.3 kg    Ideal Body Weight:  105 kg  BMI:  Body mass index is  45.53 kg/m.  Estimated Nutritional Needs:   Kcal:  1600-1800 calories  Protein:  80-95 g  Fluid:  1.6-1.8 L    Meda Klinefelter, Dietetic Intern

## 2019-07-12 NOTE — Progress Notes (Addendum)
Subjective:   [name] assessed at bedside this morning. She is on BiPAP at 60% FiO2 overnight. This morning, she was on the BiPAP during assessment and was transitioned to 15L NRB. She was tolerating this well.  We discussed using CPAP tonight with [name], her daughter and the RT in the room. We also discussed trying to get her on HFNC and using the incentive spirometer as this will help with her respiratory status. She will continue to work with PT today.   Daughter at bedside. Discussed with her regarding patient's clinical improvement and goals for discharge. All questions and concerns addressed.   Objective:  Vital signs in last 24 hours: Vitals:   07/12/19 0106 07/12/19 0301 07/12/19 0539 07/12/19 0544  BP: (!) 148/51  (!) 156/54   Pulse: (!) 51 (!) 56 (!) 56   Resp: 20 (!) 21 20   Temp:   (!) 97.5 F (36.4 C)   TempSrc:   Oral   SpO2: 99% 100% 91%   Weight:    109.3 kg  Height:       Physical Exam: Physical Exam Vitals signs and nursing note reviewed.  Constitutional:      Appearance: She is well-developed.  Cardiovascular:     Rate and Rhythm: Normal rate and regular rhythm.     Pulses: Normal pulses.     Heart sounds: Normal heart sounds.  Pulmonary:     Effort: Pulmonary effort is normal. No tachypnea, accessory muscle usage or respiratory distress.     Breath sounds: Normal breath sounds.     Comments: Diffuse crackles improved today.  Skin:    General: Skin is warm and dry.  Neurological:     General: No focal deficit present.     Mental Status: She is alert and oriented to person, place, and time.     Assessment/Plan:  [name] is a 84 y/o female with PMHx of HTN, CKD IV, CHF, OSA and A.fib presenting with sudden onset dyspnea in setting of increased O2 requirement for 1 week after cardioversion she was found to have acute hypoxic respiratory failure.   Acute hypoxic respiratory failure Patient presented with dyspnea and increased  oxygen requirement.  Initial chest xray at the ED (10/26) showed diffuse bilateral airspace opacities. CT on 10/26 showed widespread ground glass opacities bilaterally with airway thickening and scattered secretions, possibly ARDS vs infection vs edema. Patient has completed doxycycline and cefepime. She is currently on a steroid taper. PCCM recommends continuing the steroid taper and weaning oxygen as tolerated. No intervention required at this time. We appreciate their recommendations.  Patient tolerated out of bed yesterday on NRB.Marland Kitchen She uses BiPAP at night to sleep. This morning, patient was transitioned to NRB at bedside and tolerated it well. Goal is to have patient down to 4L [place] for discharge.  - Prednisone 40mg  qd Day 1/5 - Transition to HFNC - Duoneb 52ml q4h  - CPAP overnight and BiPAP prn  - Incentive spirometry - Strict I/O's and daily weights - PT/OT eval   QTc Prolongation:  Patient with recent cardioversion for atypical atrial flutter. Her initial EKG on presentation with QTc 496 with most recent one of 559ms. Per caardiology recommendations, will continue with Diltiazem and Eliquis.  Will continue to monitor with serial EKGs.  - Cardiac monitoring - Diltiazem 180mg  qd - Avoid QT prolonging agents   Thrombocytopenia in setting of chronic ITP Patient has a history of chronic ITP for which she receives weekly N-plate  infusions. She received an infusion of N-plate yesterday. Her platelets continue to improve with most recent plt count of 144.  Appreciate Dr. Antonieta Pert recommendations.  - CBC daily   CKD Stage IV:  Cr continues to improve with free water repletion and holding Lasix. Na 147 today.  - Free water repletion 317ml q8h - Avoid nephrotoxic agents - BMP daily   Dispo: Anticipated discharge pending clinical improvement.    Harvie Heck, MD Internal Medicine PGY-1  Pager: [phone] 07/12/2019, 7:16 AM

## 2019-07-12 NOTE — NC FL2 (Signed)
[place] LEVEL OF CARE SCREENING TOOL     IDENTIFICATION  Patient Name: [name] Birthdate: [birth year] Sex: female Admission Date (Current Location): 07/03/2019  [facility] and [place] Number:  Herbalist and Address:  [facility], [place] [address], [place], [place] [address]      Provider Number: [id number]  Attending Physician Name and Address:  Axel Filler, *  Relative Name and Phone Number:  [name] (daughter) [phone]    Current Level of Care: Hospital Recommended Level of Care: [place] Prior Approval Number:    Date Approved/Denied:   PASRR Number: [id number]  Discharge Plan: SNF    Current Diagnoses: Patient Active Problem List   Diagnosis Date Noted  . Pressure injury of skin 07/10/2019  . HCAP (healthcare-associated pneumonia)   . Acute hypoxemic respiratory failure ([place]) 07/03/2019  . Atypical atrial flutter ([place])   . Normocytic anemia 05/18/2017  . Chronic ITP (idiopathic thrombocytopenia) (HCC) 01/25/2017  . Chronic kidney disease (CKD) stage G4/A1, severely decreased glomerular filtration rate (GFR) between 15-29 mL/min/1.73 square meter and albuminuria creatinine ratio less than 30 mg/g (HCC) 09/04/2015  . Chronic kidney disease (CKD), stage IV (severe) ([place]) 09/04/2015  . Acute respiratory failure with hypoxia ([place]) 08/24/2015  . GOUT 06/23/2010  . DERMATITIS 01/16/2010  . BACK PAIN 06/13/2009  . MYALGIA 06/13/2009  . Anxiety state 12/13/2008  . LEG EDEMA, CHRONIC 11/08/2008  . Sleep apnea 10/26/2008  . Hypothyroidism 09/27/2008  . HYPERCHOLESTEROLEMIA 09/27/2008  . OBESITY 09/27/2008  . DEPRESSION 09/27/2008  . Essential hypertension 09/27/2008  . ALLERGIC RHINITIS 09/27/2008  . ASTHMA, CHILDHOOD 09/27/2008    Orientation RESPIRATION BLADDER Height & Weight     Self, Time, Situation, Place  Other (Comment)(BiPAp 60) Incontinent, External catheter Weight:  240 lb 15.4 oz (109.3 kg) Height:  5\' 1"  (154.9 cm)  BEHAVIORAL SYMPTOMS/MOOD NEUROLOGICAL BOWEL NUTRITION STATUS      Continent Diet(see discharge summary)  AMBULATORY STATUS COMMUNICATION OF NEEDS Skin   Limited Assist Verbally Other (Comment), PU Stage and Appropriate Care(abrasion right knee, cracking right and left leg, ecchymosis on arms, MASD buttocks, pressure injury stage 1 buttocks)                       Personal Care Assistance Level of Assistance  Bathing, Feeding, Dressing, Total care Bathing Assistance: Limited assistance Feeding assistance: Independent Dressing Assistance: Limited assistance Total Care Assistance: Limited assistance   Functional Limitations Info  Sight, Hearing, Speech Sight Info: Adequate Hearing Info: Adequate Speech Info: Adequate    SPECIAL CARE FACTORS FREQUENCY  PT (By licensed PT), OT (By licensed OT)     PT Frequency: min 5x weekly OT Frequency: min 5x weekly            Contractures Contractures Info: Not present    Additional Factors Info  Code Status, Allergies Code Status Info: partial Allergies Info: adhesive (tape), apixaban, aspirin, mirabegron, pineapple, metformin, tetracycline, fluticasone-salmeterol, iodinated diagnostic agents, lactose intolerance, latex, lisinopril, metronidazole, sulfa antibiotics, sulfonamide derivatives           Current Medications (07/12/2019):  This is the current hospital active medication list Current Facility-Administered Medications  Medication Dose Route Frequency Provider Last Rate Last Dose  . 0.9 %  sodium chloride infusion   Intravenous PRN Aldine Contes, MD 10 mL/hr at 07/09/19 2059 250 mL at 07/09/19 2059  . acetaminophen (TYLENOL) tablet 650 mg  650 mg Oral Q6H PRN Santos-Sanchez, Idalys,  MD       Or  . acetaminophen (TYLENOL) suppository 650 mg  650 mg Rectal Q6H PRN Santos-Sanchez, Merlene Morse, MD      . apixaban (ELIQUIS) tablet 2.5 mg  2.5 mg Oral BID Jean Rosenthal, MD   2.5  mg at 07/12/19 0941  . atorvastatin (LIPITOR) tablet 40 mg  40 mg Oral QHS Welford Roche, MD   40 mg at 07/10/19 2343  . Chlorhexidine Gluconate Cloth 2 % PADS 6 each  6 each Topical Daily Aldine Contes, MD   6 each at 07/11/19 0902  . cholecalciferol (VITAMIN D3) tablet 2,000 Units  2,000 Units Oral Daily Priscella Mann, RPH   2,000 Units at 07/12/19 0941  . diltiazem (CARDIZEM CD) 24 hr capsule 180 mg  180 mg Oral QPM Santos-Sanchez, Idalys, MD   180 mg at 07/11/19 1754  . fenofibrate tablet 54 mg  54 mg Oral Daily Welford Roche, MD   54 mg at 07/12/19 0941  . free water 350 mL  350 mL Oral Q8H Aslam, Sadia, MD      . influenza vaccine adjuvanted (FLUAD) injection 0.5 mL  0.5 mL Intramuscular Tomorrow-1000 Narendra, Nischal, MD      . insulin aspart (novoLOG) injection 0-20 Units  0-20 Units Subcutaneous TID WC Welford Roche, MD   7 Units at 07/12/19 0639  . insulin aspart (novoLOG) injection 0-5 Units  0-5 Units Subcutaneous QHS Welford Roche, MD   3 Units at 07/11/19 2126  . insulin glargine (LANTUS) injection 20 Units  20 Units Subcutaneous QAC breakfast Jose Persia, MD   20 Units at 07/12/19 [phone]  . insulin glargine (LANTUS) injection 60 Units  60 Units Subcutaneous QHS Jose Persia, MD   60 Units at 07/11/19 2125  . ipratropium-albuterol (DUONEB) 0.5-2.5 (3) MG/3ML nebulizer solution 3 mL  3 mL Nebulization TID Aldine Contes, MD   3 mL at 07/12/19 0728  . levothyroxine (SYNTHROID) tablet 112 mcg  112 mcg Oral Daily Welford Roche, MD   112 mcg at 07/12/19 0640  . MEDLINE mouth rinse  15 mL Mouth Rinse q12n4p Aldine Contes, MD   15 mL at 07/11/19 1223  . multivitamin with minerals tablet 1 tablet  1 tablet Oral Daily Welford Roche, MD   1 tablet at 07/12/19 0940  . pantoprazole (PROTONIX) EC tablet 40 mg  40 mg Oral Daily Aslam, Loralyn Freshwater, MD   40 mg at 07/12/19 0941  . predniSONE (DELTASONE) tablet 40 mg  40 mg Oral Q  breakfast Aslam, Sadia, MD      . ramelteon (ROZEREM) tablet 8 mg  8 mg Oral QHS [name], MD   8 mg at 07/11/19 2125  . sertraline (ZOLOFT) tablet 100 mg  100 mg Oral QPM Masoudi, Elhamalsadat, MD   100 mg at 07/11/19 1754  . sodium chloride (OCEAN) 0.65 % nasal spray 1 spray  1 spray Each Nare PRN Aslam, Sadia, MD      . sodium chloride flush (NS) 0.9 % injection 3 mL  3 mL Intravenous Q12H Santos-Sanchez, Idalys, MD   3 mL at 07/12/19 1024  . vitamin C (ASCORBIC ACID) tablet 250 mg  250 mg Oral Daily Welford Roche, MD   250 mg at 07/12/19 0940     Discharge Medications: Please see discharge summary for a list of discharge medications.  Relevant Imaging Results:  Relevant Lab Results:   Additional Information SSN: [id number]  Alberteen Sam, LCSW

## 2019-07-12 NOTE — Care Management Important Message (Signed)
Important Message  Patient Details  Name: [name] MRN: [id number] Date of Birth: December 12, 1934   Medicare Important Message Given:  Yes     Shelda Altes 07/12/2019, 1:22 PM

## 2019-07-12 NOTE — TOC Progression Note (Addendum)
Transition of Care [facility]) - Progression Note    Patient Details  Name: [name] MRN: [id number] Date of Birth: [birth year]  Transition of Care [facility]) CM/SW [place], [place] Phone Number: [phone] 07/12/2019, 11:00 AM  Clinical Narrative:     CSW spoke with [facility] admissions regarding SNF rec, they report they are able to accept patient back on SNF side of facility when medically stable to discharge. They requested clinicals, CSW has faxed clinicals to [facility] pending patient's medical readiness to dc to [facility] SNF.   CSW informed resident MD that updated covid test will need to be ordered prior to patient's discharge to SNF,  Expected Discharge Plan: Assisted Living Barriers to Discharge: Continued Medical Work up  Expected Discharge Plan and Services Expected Discharge Plan: Assisted Living       Living arrangements for the past 2 months: Assisted Living Facility([facility])                                       Social Determinants of Health (SDOH) Interventions    Readmission Risk Interventions No flowsheet data found.

## 2019-07-12 NOTE — Progress Notes (Addendum)
Physical Therapy Treatment Patient Details Name: [name] MRN: [id number] DOB: Jan 16, 1935 Today's Date: 07/12/2019    History of Present Illness 84yo F with extensive past medical history presented SOB with increased oxygen demands, requiring initiation of BIPAP therapy. Patient has been unable to wean from NIV therapy over 2 days.  PCCM consulted for further management.     PT Comments    Patient seen for mobility progression. Pt requires min guard/min A for mobility this session. Pt with DOE and on 15L NRB so OOB mobility limited to transfer bed to recliner using RW. Continue to progress as tolerated.    Follow Up Recommendations  SNF(at [facility] where she is currently in ALF)     Equipment Recommendations  None recommended by PT    Recommendations for Other Services       Precautions / Restrictions Precautions Precautions: Fall Restrictions Weight Bearing Restrictions: No    Mobility  Bed Mobility Overal bed mobility: Needs Assistance Bed Mobility: Supine to Sit     Supine to sit: Min assist     General bed mobility comments: pt reaching for therapist for assistance to elevate trunk; cues given for sequencing and use of rail  Transfers Overall transfer level: Needs assistance Equipment used: Rolling walker (2 wheeled) Transfers: Sit to/from Stand Sit to Stand: Min assist         General transfer comment: cues for safe hand placement however pt pulling RW to stand; assistance to stabilize RW   Ambulation/Gait Ambulation/Gait assistance: Min assist   Assistive device: Rolling walker (2 wheeled) Gait Pattern/deviations: Step-to pattern;Decreased step length - right;Decreased step length - left Gait velocity: decr   General Gait Details: assist to steady; pt took a few steps bed to chair; limited by DOE and pt on [employer] Rankin (Stroke Patients Only)       Balance Overall balance  assessment: Needs assistance Sitting-balance support: Feet supported;Bilateral upper extremity supported Sitting balance-Leahy Scale: Poor     Standing balance support: Bilateral upper extremity supported;During functional activity Standing balance-Leahy Scale: Poor                              Cognition Arousal/Alertness: Awake/alert Behavior During Therapy: Flat affect Overall Cognitive Status: Within Functional Limits for tasks assessed                                        Exercises General Exercises - Lower Extremity Ankle Circles/Pumps: AROM;Both;10 reps;Supine Long Arc Quad: AROM;Both;10 reps;Seated Hip Flexion/Marching: AROM;Both;5 reps;Seated Toe Raises: AROM;Both;10 reps;Seated Heel Raises: AROM;Both;10 reps;Seated    General Comments        Pertinent Vitals/Pain Pain Assessment: No/denies pain    Home Living                      Prior Function            PT Goals (current goals can now be found in the care plan section) Acute Rehab PT Goals Patient Stated Goal: to go back to A living Progress towards PT goals: Progressing toward goals    Frequency    Min 3X/week      PT Plan Current plan remains appropriate    Co-evaluation  AM-PAC PT "6 Clicks" Mobility   Outcome Measure  Help needed turning from your back to your side while in a flat bed without using bedrails?: A Lot Help needed moving from lying on your back to sitting on the side of a flat bed without using bedrails?: A Lot Help needed moving to and from a bed to a chair (including a wheelchair)?: A Little Help needed standing up from a chair using your arms (e.g., wheelchair or bedside chair)?: A Little Help needed to walk in hospital room?: A Lot Help needed climbing 3-5 steps with a railing? : Total 6 Click Score: 13    End of Session Equipment Utilized During Treatment: Oxygen(15L NRB) Activity Tolerance: Other  (comment)(limited by DOE) Patient left: with call bell/phone within reach;with family/visitor present;in chair Nurse Communication: Mobility status PT Visit Diagnosis: Muscle weakness (generalized) (M62.81)     Time: 7628-3151 PT Time Calculation (min) (ACUTE ONLY): 21 min  Charges:  $Gait Training: 8-22 mins                     Earney Navy, PTA Acute Rehabilitation Services Pager: [phone] Office: [phone]     Darliss Cheney 07/12/2019, 10:19 AM

## 2019-07-12 NOTE — Progress Notes (Signed)
Ms. Linders is about the same.  Her platelet count is up to 144,000.  She did get a dose of Nplate yesterday.  Her hemoglobin is 9.4.  She did get a dose of Aranesp yesterday.  She still is on the BiPAP.  Hopefully, they will continue to ease up on this.  I am not sure when her next chest x-ray will be done.  She is still not eating all that much.  I think a lot of this is from the diet that she is on.  Her blood sugars are on the high side.  Her BUN and creatinine are 97 and 2.23.  This continues to improve.  Her calcium is little on the higher side.  I do not think this is anything that is malignant related.  Her white cell count is up a little bit.  Some of this might be from the steroid that she is on.  I think the steroids are being slowly tapered off.  We rechecked her urine culture yesterday.  This is still pending.  I think she is out of bed a little bit more each day.  Her blood pressure is on the higher side at 156/54.  Her pulse is 56.  Her temperature is 97.5.  Her lungs sound pretty clear bilaterally.  She has good air movement bilaterally.  Cardiac exam regular rate and rhythm.  I am confident that her platelet count continue to go up.  I think this is all reflective of this infection that she had.  The infection is being treated aggressively.  Hopefully, her pulmonary function will improve.  This certainly will take some time.  I know she is getting fantastic care from everybody up on [address].  [name], MD  Psalm 28:8

## 2019-07-12 NOTE — Progress Notes (Signed)
RT placed pt on CPAP on V60 for the night on 10cmH2O for pt comfort, pt was wanting more pressure than her home setting of 6. Pt respiratory status is stable at this time on CPAP. Daughter at bedside. RT will continue to monitor.

## 2019-07-12 NOTE — Plan of Care (Signed)
  Problem: Clinical Measurements: Goal: Will remain free from infection Outcome: Progressing   Problem: Activity: Goal: Risk for activity intolerance will decrease Outcome: Progressing   Problem: Safety: Goal: Ability to remain free from injury will improve Outcome: Progressing   Problem: Activity: Goal: Capacity to carry out activities will improve Outcome: Progressing   Problem: Clinical Measurements: Goal: Respiratory complications will improve Outcome: Not Progressing

## 2019-07-12 NOTE — Progress Notes (Signed)
Called by RN that patient was desating to 74s and that patient felt tired. RT enter and MD came in behind. Patient was on 15L NRB sats 89%. Patient was placed on BIPAP and per MD to leave on for 1-2 hours and take off. RT will continue to monitor. Vitals stable.

## 2019-07-13 ENCOUNTER — Inpatient Hospital Stay: Payer: Medicare Other

## 2019-07-13 ENCOUNTER — Inpatient Hospital Stay: Payer: Medicare Other | Admitting: Family

## 2019-07-13 LAB — GLUCOSE, CAPILLARY
Glucose-Capillary: 267 mg/dL — ABNORMAL HIGH (ref 70–99)
Glucose-Capillary: 427 mg/dL — ABNORMAL HIGH (ref 70–99)
Glucose-Capillary: 420 mg/dL — ABNORMAL HIGH (ref 70–99)
Glucose-Capillary: 430 mg/dL — ABNORMAL HIGH (ref 70–99)
Glucose-Capillary: 267 mg/dL — ABNORMAL HIGH (ref 70–99)

## 2019-07-13 LAB — CBC
HCT: 32.3 % — ABNORMAL LOW (ref 36.0–46.0)
Hemoglobin: 9.8 g/dL — ABNORMAL LOW (ref 12.0–15.0)
MCH: 28.6 pg (ref 26.0–34.0)
MCHC: 30.3 g/dL (ref 30.0–36.0)
MCV: 94.2 fL (ref 80.0–100.0)
Platelets: 157 10*3/uL (ref 150–400)
RBC: 3.43 MIL/uL — ABNORMAL LOW (ref 3.87–5.11)
RDW: 18.1 % — ABNORMAL HIGH (ref 11.5–15.5)
WBC: 11.8 10*3/uL — ABNORMAL HIGH (ref 4.0–10.5)
nRBC: 0 % (ref 0.0–0.2)

## 2019-07-13 LAB — BASIC METABOLIC PANEL
Anion gap: 11 (ref 5–15)
BUN: 95 mg/dL — ABNORMAL HIGH (ref 8–23)
CO2: 34 mmol/L — ABNORMAL HIGH (ref 22–32)
Calcium: 11.1 mg/dL — ABNORMAL HIGH (ref 8.9–10.3)
Chloride: 102 mmol/L (ref 98–111)
Creatinine, Ser: 2.03 mg/dL — ABNORMAL HIGH (ref 0.44–1.00)
GFR calc Af Amer: 26 mL/min — ABNORMAL LOW (ref >60)
GFR calc non Af Amer: 22 mL/min — ABNORMAL LOW (ref >60)
Glucose, Bld: 258 mg/dL — ABNORMAL HIGH (ref 70–99)
Potassium: 5.1 mmol/L (ref 3.5–5.1)
Sodium: 147 mmol/L — ABNORMAL HIGH (ref 135–145)

## 2019-07-13 MED ORDER — INSULIN ASPART 100 UNIT/ML ~~LOC~~ SOLN
0.0000 [IU] | Freq: Three times a day (TID) | SUBCUTANEOUS | Status: DC
  Administered 2019-07-13: 20 [IU] via SUBCUTANEOUS
  Administered 2019-07-14: 4 [IU] via SUBCUTANEOUS
  Administered 2019-07-14: 20 [IU] via SUBCUTANEOUS
  Administered 2019-07-14: 11 [IU] via SUBCUTANEOUS
  Administered 2019-07-15: 20 [IU] via SUBCUTANEOUS
  Administered 2019-07-15: 7 [IU] via SUBCUTANEOUS
  Administered 2019-07-15: 11 [IU] via SUBCUTANEOUS
  Administered 2019-07-16: 20 [IU] via SUBCUTANEOUS
  Administered 2019-07-16 – 2019-07-17 (×2): 4 [IU] via SUBCUTANEOUS
  Administered 2019-07-17: 3 [IU] via SUBCUTANEOUS
  Administered 2019-07-18: 4 [IU] via SUBCUTANEOUS
  Administered 2019-07-18 (×2): 20 [IU] via SUBCUTANEOUS

## 2019-07-13 MED ORDER — HYDROCERIN EX CREA
TOPICAL_CREAM | Freq: Two times a day (BID) | CUTANEOUS | Status: DC
  Administered 2019-07-13: 1 via TOPICAL
  Administered 2019-07-13 – 2019-07-18 (×9): via TOPICAL
  Filled 2019-07-13: qty 113

## 2019-07-13 MED ORDER — INSULIN ASPART 100 UNIT/ML ~~LOC~~ SOLN: 4.0000 [IU] | Freq: Three times a day (TID) | SUBCUTANEOUS | Status: DC

## 2019-07-13 MED ORDER — INSULIN GLARGINE 100 UNIT/ML ~~LOC~~ SOLN
40.0000 [IU] | Freq: Every day | SUBCUTANEOUS | Status: DC
  Filled 2019-07-13: qty 0.4

## 2019-07-13 MED ORDER — INSULIN GLARGINE 100 UNIT/ML ~~LOC~~ SOLN
40.0000 [IU] | Freq: Every day | SUBCUTANEOUS | Status: DC
  Administered 2019-07-14 – 2019-07-18 (×5): 40 [IU] via SUBCUTANEOUS
  Filled 2019-07-13 (×7): qty 0.4

## 2019-07-13 MED ORDER — INSULIN ASPART 100 UNIT/ML ~~LOC~~ SOLN
5.0000 [IU] | Freq: Three times a day (TID) | SUBCUTANEOUS | Status: DC
  Administered 2019-07-13 – 2019-07-16 (×7): 5 [IU] via SUBCUTANEOUS

## 2019-07-13 MED ORDER — INSULIN GLARGINE 100 UNIT/ML ~~LOC~~ SOLN
60.0000 [IU] | Freq: Every day | SUBCUTANEOUS | Status: DC
  Administered 2019-07-13 – 2019-07-16 (×4): 60 [IU] via SUBCUTANEOUS
  Filled 2019-07-13 (×6): qty 0.6

## 2019-07-13 MED ORDER — INSULIN ASPART 100 UNIT/ML ~~LOC~~ SOLN
10.0000 [IU] | Freq: Once | SUBCUTANEOUS | Status: AC
  Administered 2019-07-13: 10 [IU] via SUBCUTANEOUS

## 2019-07-13 NOTE — Progress Notes (Signed)
Physical Therapy Treatment Patient Details Name: [name] MRN: [id number] DOB: [birth year] Today's Date: 07/13/2019    History of Present Illness 84yo F with extensive past medical history presented SOB with increased oxygen demands, requiring initiation of BIPAP therapy. Patient has been unable to wean from NIV therapy over 2 days.  PCCM consulted for further management.     PT Comments    Patient seen for mobility progression. Pt is OOB in recliner and reports feeling better this am. Pt on 15L HFNC throughout session with SpO2 83-91%. Pt is making gradual progress toward PT goals and tolerated gait distance of 4 ft X 2 trials with seated break due to DOE. Pt requires min A +2 for functional transfer/gait training. Continue to progress as tolerated with anticipated d/c to SNF for further skilled PT services.     Follow Up Recommendations  SNF(at [facility] where she is currently in ALF)     Equipment Recommendations  None recommended by PT    Recommendations for Other Services       Precautions / Restrictions Precautions Precautions: Fall Restrictions Weight Bearing Restrictions: No    Mobility  Bed Mobility               General bed mobility comments: pt OOB in chair upon arrival  Transfers Overall transfer level: Needs assistance Equipment used: Rolling walker (2 wheeled) Transfers: Sit to/from Stand Sit to Stand: Min assist         General transfer comment: cues for safe hand placement; assist to power up and to steady  Ambulation/Gait Ambulation/Gait assistance: Min assist;+2 safety/equipment Gait Distance (Feet): (39ft X 2 trials with seated break) Assistive device: Rolling walker (2 wheeled) Gait Pattern/deviations: Step-to pattern;Decreased step length - right;Decreased step length - left;Wide base of support;Trunk flexed Gait velocity: decr   General Gait Details: assist to steady and manage RW; pt fatigues quickly and with DOE requiring  seated break   Stairs             Wheelchair Mobility    Modified Rankin (Stroke Patients Only)       Balance Overall balance assessment: Needs assistance Sitting-balance support: Feet supported Sitting balance-Leahy Scale: Fair     Standing balance support: Bilateral upper extremity supported;During functional activity Standing balance-Leahy Scale: Poor                              Cognition Arousal/Alertness: Awake/alert Behavior During Therapy: WFL for tasks assessed/performed Overall Cognitive Status: Within Functional Limits for tasks assessed                                        Exercises      General Comments General comments (skin integrity, edema, etc.): pt on 15L HFNC with SpO2 83-91% during session      Pertinent Vitals/Pain Pain Assessment: No/denies pain    Home Living                      Prior Function            PT Goals (current goals can now be found in the care plan section) Acute Rehab PT Goals Patient Stated Goal: to go back to A living Progress towards PT goals: Progressing toward goals    Frequency    Min 3X/week  PT Plan Current plan remains appropriate    Co-evaluation              AM-PAC PT "6 Clicks" Mobility   Outcome Measure  Help needed turning from your back to your side while in a flat bed without using bedrails?: A Lot Help needed moving from lying on your back to sitting on the side of a flat bed without using bedrails?: A Lot Help needed moving to and from a bed to a chair (including a wheelchair)?: A Little Help needed standing up from a chair using your arms (e.g., wheelchair or bedside chair)?: A Little Help needed to walk in hospital room?: A Lot Help needed climbing 3-5 steps with a railing? : Total 6 Click Score: 13    End of Session Equipment Utilized During Treatment: Oxygen;Gait belt(15L HFNC) Activity Tolerance: Other (comment)(limited by  DOE) Patient left: with call bell/phone within reach;with family/visitor present;in chair Nurse Communication: Mobility status PT Visit Diagnosis: Muscle weakness (generalized) (M62.81)     Time: 1040-1107 PT Time Calculation (min) (ACUTE ONLY): 27 min  Charges:  $Gait Training: 23-37 mins                     Earney Navy, PTA Acute Rehabilitation Services Pager: [phone] Office: [phone]     Darliss Cheney 07/13/2019, 11:49 AM

## 2019-07-13 NOTE — Progress Notes (Signed)
Subjective:   [name] assessed at bedside this morning. Patient worked with PT yesterday. She tolerated 15L HFNC throughout the day and CPAP overnight and maintained O2 saturation >90%. This morning, [name] is sitting up in her chair and appears to be more alert.   Daughter at bedside. All questions and concerns addressed.   Objective:  Vital signs in last 24 hours: Vitals:   07/12/19 1945 07/12/19 2144 07/13/19 0015 07/13/19 0433  BP: (!) 158/53  (!) 149/52 (!) 148/61  Pulse: 66 64 (!) 58 (!) 57  Resp: 20 (!) 30 20 20   Temp: 97.7 F (36.5 C)  98.8 F (37.1 C) 97.6 F (36.4 C)  TempSrc: Oral     SpO2: 92% 90% 92% 99%  Weight:    105.9 kg  Height:       Physical Exam: Physical Exam Vitals signs and nursing note reviewed.  Constitutional:      Appearance: She is well-developed.  Cardiovascular:     Rate and Rhythm: Normal rate and regular rhythm.     Pulses: Normal pulses.     Heart sounds: Normal heart sounds.  Pulmonary:     Effort: Pulmonary effort is normal. No tachypnea, accessory muscle usage or respiratory distress.     Comments: Diffuse crackles unchanged from prior exam Skin:    General: Skin is warm and dry.  Neurological:     General: No focal deficit present.     Mental Status: She is alert and oriented to person, place, and time.     Assessment/Plan:  [name] is a 84 y/o female with PMHx of HTN, CKD IV, CHF, OSA and A.fib presenting with sudden onset dyspnea in setting of increased O2 requirement for 1 week after cardioversion she was found to have acute hypoxic respiratory failure. CT chest with widespread ground glass opacities bilaterally with airway thickening and scattered secretions, possibly ARDS vs infection vs edema.   Acute hypoxic respiratory failure [name] is on HFNC 15L today and saturating >88%. She is currently on a steroid taper. She tolerated HFNC yesterday and used CPAP overnight. She is still coughing up small  amounts of dark phlegm. However, there is no indication that she is actively bleeding as it is not bright red blood, her respiratory function remains stable, and hemoglobin is stable. She continues to have diffuse crackles on auscultation that are unchanged from prior exam. Encouraged ICS hourly. Will continue to monitor.  - Prednisone 40mg  qd Day 2/5 - Duoneb 6ml q4h  - CPAP overnight and BiPAP prn  - Incentive spirometry - Strict I/O's and daily weights - PT/OT eval   QTc Prolongation:  Patient with recent cardioversion for atypical atrial flutter. Her initial EKG on presentation with QTc 496 with most recent one of 576ms. Will continue with diltiazem and eliquis and to monitor with serial EKGs.  - Cardiac monitoring - Diltiazem 180mg  qd - Avoid QT prolonging agents   Thrombocytopenia in setting of chronic ITP This has improved with her recent N-plate treatment. Platelet count today 157. Appreciate Dr. Antonieta Pert recommendations.  - CBC daily   CKD Stage IV:  Cr continues to improve with free water repletion and holding Lasix. Na 147 today.  - Continue free water repletion 376ml q8h - Avoid nephrotoxic agents - BMP daily  GOC:  [name] daughter, [name], expressed concerns regarding her mother's prognosis yesterday. She states that her mother likes the BiPAP because she doesn't have to breath on her own. She is concerned  that this means that her mother is giving up. She has been talking to her sister, [name], about goals of care discussion. She also reports fatigue from being here 24 hours every other day and is causing significant distress to her mental and personal health. Offered her support and reassured her that caregiver fatigue is normal. I believe patient and family would benefit from goals of care discussion given patient's significant deconditioning and respiratory status.  - Consider palliative care consult   Dispo: Anticipated discharge pending clinical improvement.    Harvie Heck, MD Internal Medicine PGY-1  Pager: [phone] 07/13/2019, 7:21 AM

## 2019-07-13 NOTE — Progress Notes (Signed)
  Speech Language Pathology Treatment: Dysphagia  Patient Details Name: [name] MRN: [id number] DOB: [birth year] Today's Date: 07/13/2019 Time: 0175-1025 SLP Time Calculation (min) (ACUTE ONLY): 25 min  Assessment / Plan / Recommendation Clinical Impression  Pt seen at bedside to assess diet tolerance and readiness to advance textures. Pt was seated in recliner, and daughter was present during this session. Pt reports poor appetite, and requires significant encouragement to take po. No overt s/s aspiration observed on trials of thin liquid, puree, or solids. Discussed options of diet advancement - recommend regular textures to provide pt with full range of PO choices and encourage adequate intake. A more conservative diet may not include items that pt might be willing to eat, thus perpetuating the cycle of poor intake. Daughter indicates family is always with pt, and will assist with set up and cutting solids to maximize energy conservation.   MD was consulted regarding restrictions needed - will advance to carb modified diet and thin liquids. Safe swallow precautions discussed with daughter and posted at [facility]. SLP will follow acutely to assess tolerance of advanced diet and continue education to maximize safety and minimize aspiration risk.  HPI        SLP Plan  Continue with current plan of care       Recommendations  Diet recommendations: Regular;Thin liquid Liquids provided via: Cup;Straw Medication Administration: Whole meds with puree Supervision: Staff to assist with self feeding;Patient able to self feed;Full supervision/cueing for compensatory strategies Compensations: Slow rate;Small sips/bites Postural Changes and/or Swallow Maneuvers: Seated upright 90 degrees                Oral Care Recommendations: Oral care QID Follow up Recommendations: Skilled Nursing facility SLP Visit Diagnosis: Dysphagia, oropharyngeal phase (R13.12) Plan: Continue with current plan of  care       [place], [facility], [place] Pathologist Office: [phone] Pager: [phone]  Shonna Chock 07/13/2019, 1:38 PM

## 2019-07-13 NOTE — Plan of Care (Signed)
  Problem: Safety: Goal: Ability to remain free from injury will improve Outcome: Progressing   Problem: Skin Integrity: Goal: Risk for impaired skin integrity will decrease Outcome: Progressing   

## 2019-07-14 DIAGNOSIS — J189 Pneumonia, unspecified organism: Secondary | ICD-10-CM | POA: Diagnosis present

## 2019-07-14 DIAGNOSIS — R739 Hyperglycemia, unspecified: Secondary | ICD-10-CM

## 2019-07-14 DIAGNOSIS — R942 Abnormal results of pulmonary function studies: Secondary | ICD-10-CM

## 2019-07-14 DIAGNOSIS — R1310 Dysphagia, unspecified: Secondary | ICD-10-CM

## 2019-07-14 DIAGNOSIS — R630 Anorexia: Secondary | ICD-10-CM

## 2019-07-14 DIAGNOSIS — J984 Other disorders of lung: Secondary | ICD-10-CM

## 2019-07-14 HISTORY — DX: Pneumonia, unspecified organism: J18.9

## 2019-07-14 HISTORY — DX: Other disorders of lung: J98.4

## 2019-07-14 LAB — BASIC METABOLIC PANEL
Anion gap: 9 (ref 5–15)
BUN: 95 mg/dL — ABNORMAL HIGH (ref 8–23)
CO2: 37 mmol/L — ABNORMAL HIGH (ref 22–32)
Calcium: 11.3 mg/dL — ABNORMAL HIGH (ref 8.9–10.3)
Chloride: 100 mmol/L (ref 98–111)
Creatinine, Ser: 1.95 mg/dL — ABNORMAL HIGH (ref 0.44–1.00)
GFR calc Af Amer: 27 mL/min — ABNORMAL LOW (ref >60)
GFR calc non Af Amer: 24 mL/min — ABNORMAL LOW (ref >60)
Glucose, Bld: 162 mg/dL — ABNORMAL HIGH (ref 70–99)
Potassium: 4.4 mmol/L (ref 3.5–5.1)
Sodium: 146 mmol/L — ABNORMAL HIGH (ref 135–145)

## 2019-07-14 LAB — GLUCOSE, CAPILLARY
Glucose-Capillary: 183 mg/dL — ABNORMAL HIGH (ref 70–99)
Glucose-Capillary: 377 mg/dL — ABNORMAL HIGH (ref 70–99)
Glucose-Capillary: 259 mg/dL — ABNORMAL HIGH (ref 70–99)
Glucose-Capillary: 266 mg/dL — ABNORMAL HIGH (ref 70–99)

## 2019-07-14 LAB — CBC
HCT: 33.3 % — ABNORMAL LOW (ref 36.0–46.0)
Hemoglobin: 9.9 g/dL — ABNORMAL LOW (ref 12.0–15.0)
MCH: 28.5 pg (ref 26.0–34.0)
MCHC: 29.7 g/dL — ABNORMAL LOW (ref 30.0–36.0)
MCV: 96 fL (ref 80.0–100.0)
Platelets: 151 10*3/uL (ref 150–400)
RBC: 3.47 MIL/uL — ABNORMAL LOW (ref 3.87–5.11)
RDW: 18 % — ABNORMAL HIGH (ref 11.5–15.5)
WBC: 12 10*3/uL — ABNORMAL HIGH (ref 4.0–10.5)
nRBC: 0 % (ref 0.0–0.2)

## 2019-07-14 NOTE — TOC Progression Note (Signed)
Transition of Care [facility]) - Progression Note    Patient Details  Name: [name] MRN: [id number] Date of Birth: 16-Apr-1935  Transition of [facility] CM/SW [place], [place] Phone Number: 07/14/2019, 2:59 PM  Clinical Narrative:     CSW is following, patient may return to [facility] once she is medically ready.   CSW will continue to follow and assist as needed.   Expected Discharge Plan: Assisted Living Barriers to Discharge: Continued Medical Work up  Expected Discharge Plan and Services Expected Discharge Plan: Assisted Living       Living arrangements for the past 2 months: Assisted Living Facility([facility])                                       Social Determinants of Health (SDOH) Interventions    Readmission Risk Interventions No flowsheet data found.

## 2019-07-14 NOTE — Plan of Care (Signed)
  Problem: Activity: Goal: Risk for activity intolerance will decrease Outcome: Progressing   

## 2019-07-14 NOTE — Progress Notes (Signed)
Subjective:   [name] assessed at bedside this morning. She tolerated 15L HFNC throughout the day yesterday and CPAP overnight and maintained O2 saturation >90%. This morning, [name] is sitting up in her chair and appears to be more alert. Oxygen was weaned down to 10L HFNC and she maintained her O2 saturation 99-100%.   Daughter at bedside. We discussed goals for discharge back to SNF. All questions and concerns addressed.   Objective:  Vital signs in last 24 hours: Vitals:   07/14/19 0457 07/14/19 0732 07/14/19 0850 07/14/19 0926  BP: (!) 140/53  (!) 164/45 (!) 152/59  Pulse: 60 (!) 59 62 61  Resp: 18 20 19    Temp: 98.3 F (36.8 C)  (!) 97.4 F (36.3 C)   TempSrc:   Temporal   SpO2: 100% 98% 99%   Weight:      Height:       Physical Exam: Physical Exam Vitals signs and nursing note reviewed.  Constitutional:      Appearance: She is well-developed.  Cardiovascular:     Rate and Rhythm: Normal rate and regular rhythm.     Pulses: Normal pulses.     Heart sounds: Normal heart sounds.  Pulmonary:     Effort: Pulmonary effort is normal. No tachypnea, accessory muscle usage or respiratory distress.     Comments: Persistent diffuse crackles on exam, continued from prior examination.  Skin:    General: Skin is warm and dry.  Neurological:     General: No focal deficit present.     Mental Status: She is alert and oriented to person, place, and time.     Assessment/Plan:  [name] is a 84 y/o female with PMHx of HTN, CKD IV, CHF, OSA and A.fib presenting with sudden onset dyspnea in setting of increased O2 requirement for 1 week after cardioversion she was found to have acute hypoxic respiratory failure. CT chest with widespread ground glass opacities bilaterally with airway thickening and scattered secretions, possibly ARDS vs infection vs edema.   Acute hypoxic respiratory failure [name] was on 15L HFNC and saturating 100% with good wave form. Her O2  was titrated down to 10L HFNC at bedside and she was able to maintain her O2 sats 99-100%.  She is currently on a steroid taper. She tolerated HFNC yesterday and used CPAP overnight. She continues to have persistent diffuse crackles on auscultation. However, this will take some time to clear up. Encouraged ICS hourly. Her decreased O2 requirement is reassuring; however, will continue to monitor over the weekend.  - Prednisone 40mg  qd Day 3/ - Duoneb 26ml q4h  - CPAP overnight and BiPAP prn  - Continue Incentive spirometry - Strict I/O's and daily weights - PT/OT eval   QTc Prolongation:  Patient with recent cardioversion for atypical atrial flutter. Her initial EKG on presentation with QTc 496 with most recent one of 520ms. Will continue with diltiazem and eliquis and to monitor with serial EKGs.  - Cardiac monitoring - Diltiazem 180mg  qd - Avoid QT prolonging agents   Thrombocytopenia in setting of chronic ITP This has improved with her recent N-plate treatment. Platelet count today 151. She is scheduled for another treatment on either Monday or Tuesday. Appreciate Dr. Antonieta Pert recommendations.  - CBC daily   CKD Stage IV:  Cr continues to improve with free water repletion and holding Lasix. Na 146 today.  - Continue free water repletion 315ml q8h - Avoid nephrotoxic agents - BMP daily  GOC:  [name] daughter, [name], expressed concerns regarding her mother's prognosis on Wednesday and reported that her and her sister were talking about goals of care. On discussion with [name] other daughter, [name], yesterday, it does not seem like she is on the same page. She appears hopeful regarding her mother's prognosis. We discussed the goals for discharge today and that she will likely require oxygen supplementation at her nursing facility. We will monitor throughout the weekend and readdress goals of care on Monday if no significant improvement.  - Continue to monitor  Dispo:  Anticipated discharge pending clinical improvement.   Harvie Heck, MD Internal Medicine PGY-1  Pager: [phone] 07/14/2019, 10:18 AM

## 2019-07-14 NOTE — Progress Notes (Signed)
Overall, it is hard to say if [name] is making much progress.  Her issue still is her lung function.  She still cannot be titrated down all that much.  Does not sound like she is eating all that much.  She has a really hard time eating with the facemask on for her oxygen.  I know she is getting some physical therapy.  She has been seen by speech pathology to help with swallowing.  At least, her blood counts are improving.  Her platelet count today is 151,000.  Her hemoglobin is 9.9.  Her BUN is 95 creatinine 1.95.  I think this is slowly improving.  There is no bleeding.  She is on the Eliquis.  Her urine culture was negative.  I think she is off antibiotics now.  According to her daughter, she is having some issues with sleeping.  She is on Rozerem.  Her blood sugars have been on the higher side.  This morning, her blood sugar is 162.  I am sure a lot of the hyperglycemia is from the steroids that she is getting.  We will continue her on Nplate weekly.  I think she probably gets a dose either Monday or Tuesday.  She may need Aranesp also given her renal insufficiency.  Hopefully, her lung function will improve.  [name], MD  Psalm 30:2

## 2019-07-14 NOTE — Progress Notes (Signed)
Occupational Therapy Treatment Patient Details Name: [name] MRN: [id number] DOB: 1934/09/26 Today's Date: 07/14/2019    History of present illness 84yo F with extensive past medical history presented SOB with increased oxygen demands, requiring initiation of BIPAP therapy. Patient has been unable to wean from NIV therapy over 2 days.  PCCM consulted for further management.    OT comments  Pt progressing towards acute OT goals. Focus of session was building activity tolerance towards ADL goals. Pt received on 10L HFNC. Pt stood 3x from recliner. Slowly walked in place/lateral weight-shifted with rw. O2 sats 87-91. Cues for breathing exercises prior to mobilizing as pt has a tendency to hold her breath during transitional movements. Daughter present throughout session. D/c recommendation remains appropriate.    Follow Up Recommendations  SNF    Equipment Recommendations  Other (comment)(TBD at next venue of care)    Recommendations for Other Services      Precautions / Restrictions Precautions Precautions: Fall Precaution Comments:   Restrictions Weight Bearing Restrictions: No       Mobility Bed Mobility               General bed mobility comments: pt OOB in chair upon arrival. sleeps in recliner at home per daughter  Transfers Overall transfer level: Needs assistance Equipment used: Rolling walker (2 wheeled) Transfers: Sit to/from Stand Sit to Stand: Min assist         General transfer comment: 3x from recliner. min A to steady. cues for hand placement. Tends to hold breath during transfers. Cued for a few breathing exercises prior to standing    Balance Overall balance assessment: Needs assistance Sitting-balance support: Feet supported Sitting balance-Leahy Scale: Fair     Standing balance support: Bilateral upper extremity supported;During functional activity Standing balance-Leahy Scale: Poor Standing balance comment: walker and min guard                            ADL either performed or assessed with clinical judgement   ADL Overall ADL's : Needs assistance/impaired                                     Functional mobility during ADLs: Minimal assistance;Rolling walker;+2 for safety/equipment General ADL Comments: patient limited by weakness, decreased activity tolerance, impaired balance. 3x sit<>stands from recliner, 2x standing and slowly walking in place just under a minute each trial.      Vision       Perception     Praxis      Cognition Arousal/Alertness: Awake/alert Behavior During Therapy: WFL for tasks assessed/performed Overall Cognitive Status: Within Functional Limits for tasks assessed                                          Exercises     Shoulder Instructions       General Comments Pt on 10L HFNC this session with sats 88-91 during session. Needs cues for breathing through nose especially during transfers    Pertinent Vitals/ Pain       Pain Assessment: Faces Faces Pain Scale: Hurts little more Pain Location: generalized, grimacing with movement Pain Descriptors / Indicators: Aching Pain Intervention(s): Monitored during session;Limited activity within patient's tolerance  Home Living  Prior Functioning/Environment              Frequency  Min 2X/week        Progress Toward Goals  OT Goals(current goals can now be found in the care plan section)  Progress towards OT goals: Progressing toward goals  Acute Rehab OT Goals Patient Stated Goal: to go back to A living OT Goal Formulation: With patient Time For Goal Achievement: 07/24/19 Potential to Achieve Goals: Good ADL Goals Pt Will Perform Grooming: with min guard assist;standing Pt Will Perform Lower Body Dressing: with min assist;sit to/from stand;with adaptive equipment Pt Will Transfer to Toilet: with  supervision;ambulating;bedside commode Pt Will Perform Toileting - Clothing Manipulation and hygiene: with min assist;sit to/from stand Additional ADL Goal #1: Pt will demonstrate increased activity tolerance by completing 3 consecutive ADL tasks without rest breaks using energy conservation techniques as needed.  Plan Discharge plan remains appropriate    Co-evaluation                 AM-PAC OT "6 Clicks" Daily Activity     Outcome Measure   Help from another person eating meals?: A Little Help from another person taking care of personal grooming?: A Little Help from another person toileting, which includes using toliet, bedpan, or urinal?: A Lot Help from another person bathing (including washing, rinsing, drying)?: A Lot Help from another person to put on and taking off regular upper body clothing?: A Little Help from another person to put on and taking off regular lower body clothing?: A Lot 6 Click Score: 15    End of Session Equipment Utilized During Treatment: Rolling walker;Oxygen(10L HFNC)  OT Visit Diagnosis: Other abnormalities of gait and mobility (R26.89);Muscle weakness (generalized) (M62.81)   Activity Tolerance Patient limited by fatigue   Patient Left in chair;with call bell/phone within reach;with family/visitor present(daughter staying with her)   Nurse Communication          Time: 1106-1130 OT Time Calculation (min): 24 min  Charges: OT General Charges $OT Visit: 1 Visit OT Treatments $Self Care/Home Management : 23-37 mins  Tyrone Schimke, OT Acute Rehabilitation Services Pager: [phone] Office: [place], [place] 07/14/2019, 1:39 PM

## 2019-07-14 NOTE — Care Management Important Message (Signed)
Important Message  Patient Details  Name: [name] MRN: [id number] Date of Birth: 09-12-1934   Medicare Important Message Given:  Yes     Shelda Altes 07/14/2019, 2:44 PM

## 2019-07-15 DIAGNOSIS — I509 Heart failure, unspecified: Secondary | ICD-10-CM

## 2019-07-15 LAB — CBC
HCT: 30 % — ABNORMAL LOW (ref 36.0–46.0)
Hemoglobin: 9.1 g/dL — ABNORMAL LOW (ref 12.0–15.0)
MCH: 28.7 pg (ref 26.0–34.0)
MCHC: 30.3 g/dL (ref 30.0–36.0)
MCV: 94.6 fL (ref 80.0–100.0)
Platelets: 148 10*3/uL — ABNORMAL LOW (ref 150–400)
RBC: 3.17 MIL/uL — ABNORMAL LOW (ref 3.87–5.11)
RDW: 18.2 % — ABNORMAL HIGH (ref 11.5–15.5)
WBC: 10.3 10*3/uL (ref 4.0–10.5)
nRBC: 0 % (ref 0.0–0.2)

## 2019-07-15 LAB — BASIC METABOLIC PANEL
Anion gap: 10 (ref 5–15)
BUN: 111 mg/dL — ABNORMAL HIGH (ref 8–23)
CO2: 37 mmol/L — ABNORMAL HIGH (ref 22–32)
Calcium: 11.3 mg/dL — ABNORMAL HIGH (ref 8.9–10.3)
Chloride: 97 mmol/L — ABNORMAL LOW (ref 98–111)
Creatinine, Ser: 1.98 mg/dL — ABNORMAL HIGH (ref 0.44–1.00)
GFR calc Af Amer: 27 mL/min — ABNORMAL LOW (ref >60)
GFR calc non Af Amer: 23 mL/min — ABNORMAL LOW (ref >60)
Glucose, Bld: 316 mg/dL — ABNORMAL HIGH (ref 70–99)
Potassium: 5 mmol/L (ref 3.5–5.1)
Sodium: 144 mmol/L (ref 135–145)

## 2019-07-15 LAB — GLUCOSE, CAPILLARY
Glucose-Capillary: 239 mg/dL — ABNORMAL HIGH (ref 70–99)
Glucose-Capillary: 386 mg/dL — ABNORMAL HIGH (ref 70–99)
Glucose-Capillary: 289 mg/dL — ABNORMAL HIGH (ref 70–99)
Glucose-Capillary: 267 mg/dL — ABNORMAL HIGH (ref 70–99)

## 2019-07-15 NOTE — Plan of Care (Signed)
  Problem: Education: Goal: Knowledge of General Education information will improve Description Including pain rating scale, medication(s)/side effects and non-pharmacologic comfort measures Outcome: Progressing   Problem: Health Behavior/Discharge Planning: Goal: Ability to manage health-related needs will improve Outcome: Progressing   

## 2019-07-15 NOTE — Progress Notes (Signed)
   Subjective: HD#12   Overnight: Tolerated CPAP  Today, [name] was examined at bedside with her daughter [name].  She was sitting in the bedside recliner eating breakfast.  She reports that she is doing very well and has noticed improvement to her dyspnea.  Objective:  Vital signs in last 24 hours: Vitals:   07/14/19 2240 07/15/19 0138 07/15/19 0200 07/15/19 0628  BP:  (!) 154/60  (!) 153/56  Pulse: 60  60 (!) 59  Resp:    20  Temp:      TempSrc:      SpO2: 94%  99% 95%  Weight:      Height:       Const: In no apparent distress, lying comfortably in bed, conversational HEENT: Atraumatic, normocephalic Resp: Mild to moderate crackles auscultated posteriorly CV: RRR, no murmurs, gallop, rub  Assessment/Plan:  Principal Problem:   Acute hypoxemic respiratory failure (HCC) Active Problems:   Chronic kidney disease (CKD), stage IV (severe) (HCC)   Chronic ITP (idiopathic thrombocytopenia) (HCC)   Atypical atrial flutter (HCC)   Pressure injury of skin   Pneumonitis  [name] is a 84 y/o female with PMHx of HTN, CKD IV, CHF, OSA and A.fib presenting with sudden onset dyspnea in setting of increased O2 requirement for 1 week after cardioversion she was found to have acute hypoxic respiratory failure. CT chest with widespread ground glass opacities bilaterally with airway thickening and scattered secretions, possibly ARDS vs infection vs edema.   Acute hypoxic respiratory failure She tolerated CPAP overnight without any difficulties.  This morning during our assessment, she was very comfortable sitting on the bedside recliner and eating breakfast.  She was saturating at about 90% on 10 L HFNC - Prednisone 40mg  qd Day 4/5 --> continue slow taper - Duoneb 49ml q4h  - CPAP overnight and BiPAP prn  - Continue Incentive spirometry - Strict I/O's and daily weights - PT/OT eval   Thrombocytopenia in setting of chronic ITP Platelets stable - Appreciate Dr.  Antonieta Pert recommendations.  - CBC daily   CKD Stage IV:  sCr this am 1.9 <-- 146 - Continue free water repletion 349ml q8h - Avoid nephrotoxic agents - BMP daily  Hypernatremia-improved sNa of 144 -Continue free water  GOC:  Seems there is discordance between the 2 daughters regarding definitive plans for goals of care.  We will continue to monitor the situation and involve palliative medicine if needed.  QTc Prolongation:  Most recent QTC of 513 - Cardiac monitoring - Diltiazem 180mg  qd - Avoid QT prolonging agents  Dispo: Anticipated discharge pending clinical improvement.   Sara Rosenthal, MD 07/15/2019, 6:31 AM Pager: [phone] Internal Medicine Teaching Service

## 2019-07-15 NOTE — Plan of Care (Signed)
  Problem: Clinical Measurements: Goal: Ability to maintain clinical measurements within normal limits will improve Outcome: Progressing Goal: Will remain free from infection Outcome: Progressing Goal: Diagnostic test results will improve Outcome: Progressing Goal: Respiratory complications will improve Outcome: Progressing Goal: Cardiovascular complication will be avoided Outcome: Progressing   Problem: Activity: Goal: Risk for activity intolerance will decrease Outcome: Progressing   Problem: Nutrition: Goal: Adequate nutrition will be maintained Outcome: Progressing   Problem: Coping: Goal: Level of anxiety will decrease Outcome: Progressing   Problem: Elimination: Goal: Will not experience complications related to bowel motility Outcome: Progressing Goal: Will not experience complications related to urinary retention Outcome: Progressing   Problem: Pain Managment: Goal: General experience of comfort will improve Outcome: Progressing   Problem: Safety: Goal: Ability to remain free from injury will improve Outcome: Progressing   Problem: Skin Integrity: Goal: Risk for impaired skin integrity will decrease Outcome: Progressing   Problem: Activity: Goal: Capacity to carry out activities will improve Outcome: Progressing   Problem: Cardiac: Goal: Ability to achieve and maintain adequate cardiopulmonary perfusion will improve Outcome: Progressing

## 2019-07-16 LAB — BASIC METABOLIC PANEL
Anion gap: 11 (ref 5–15)
BUN: 114 mg/dL — ABNORMAL HIGH (ref 8–23)
CO2: 37 mmol/L — ABNORMAL HIGH (ref 22–32)
Calcium: 11.6 mg/dL — ABNORMAL HIGH (ref 8.9–10.3)
Chloride: 95 mmol/L — ABNORMAL LOW (ref 98–111)
Creatinine, Ser: 1.96 mg/dL — ABNORMAL HIGH (ref 0.44–1.00)
GFR calc Af Amer: 27 mL/min — ABNORMAL LOW (ref >60)
GFR calc non Af Amer: 23 mL/min — ABNORMAL LOW (ref >60)
Glucose, Bld: 207 mg/dL — ABNORMAL HIGH (ref 70–99)
Potassium: 5.1 mmol/L (ref 3.5–5.1)
Sodium: 143 mmol/L (ref 135–145)

## 2019-07-16 LAB — CBC
HCT: 33.8 % — ABNORMAL LOW (ref 36.0–46.0)
Hemoglobin: 10.2 g/dL — ABNORMAL LOW (ref 12.0–15.0)
MCH: 28.9 pg (ref 26.0–34.0)
MCHC: 30.2 g/dL (ref 30.0–36.0)
MCV: 95.8 fL (ref 80.0–100.0)
Platelets: 180 10*3/uL (ref 150–400)
RBC: 3.53 MIL/uL — ABNORMAL LOW (ref 3.87–5.11)
RDW: 18.3 % — ABNORMAL HIGH (ref 11.5–15.5)
WBC: 14.1 10*3/uL — ABNORMAL HIGH (ref 4.0–10.5)
nRBC: 0.1 % (ref 0.0–0.2)

## 2019-07-16 LAB — GLUCOSE, CAPILLARY
Glucose-Capillary: 162 mg/dL — ABNORMAL HIGH (ref 70–99)
Glucose-Capillary: 177 mg/dL — ABNORMAL HIGH (ref 70–99)
Glucose-Capillary: 354 mg/dL — ABNORMAL HIGH (ref 70–99)
Glucose-Capillary: 499 mg/dL — ABNORMAL HIGH (ref 70–99)
Glucose-Capillary: 379 mg/dL — ABNORMAL HIGH (ref 70–99)

## 2019-07-16 MED ORDER — INSULIN ASPART 100 UNIT/ML ~~LOC~~ SOLN
12.0000 [IU] | Freq: Three times a day (TID) | SUBCUTANEOUS | Status: DC
  Administered 2019-07-17 – 2019-07-18 (×6): 12 [IU] via SUBCUTANEOUS

## 2019-07-16 MED ORDER — INSULIN ASPART 100 UNIT/ML ~~LOC~~ SOLN
18.0000 [IU] | Freq: Once | SUBCUTANEOUS | Status: AC
  Administered 2019-07-16: 18 [IU] via SUBCUTANEOUS

## 2019-07-16 NOTE — Plan of Care (Signed)

## 2019-07-16 NOTE — Progress Notes (Signed)
   Subjective:  [name] was examined at bedside with her daughter, [name], present in the room. She was sitting in the bedside recliner. She reports that she is doing very well and feels better. She is on 9L HFNC and was titrated to 7L HFNC during examination and tolerated it well.   Objective:  Vital signs in last 24 hours: Vitals:   07/15/19 1950 07/15/19 1955 07/16/19 0014 07/16/19 0406  BP: (!) 146/79  100/62 (!) 155/67  Pulse: 68  62 66  Resp: 20  20 20   Temp: 97.8 F (36.6 C)  97.7 F (36.5 C) (!) 97.4 F (36.3 C)  TempSrc: Oral  Oral Oral  SpO2: 91% 90% 92% 100%  Weight:    107.8 kg  Height:       Physical Exam Constitutional:      General: She is not in acute distress.    Appearance: She is obese. She is not ill-appearing.  Cardiovascular:     Rate and Rhythm: Normal rate and regular rhythm.     Pulses: Normal pulses.     Heart sounds: Normal heart sounds.  Pulmonary:     Effort: Pulmonary effort is normal. No accessory muscle usage or respiratory distress.     Breath sounds: Rales present.     Comments: Mild crackles appreciated diffusely, improving  Chest:     Chest wall: No mass, tenderness or crepitus.  Neurological:     Mental Status: She is alert and oriented to person, place, and time.     Assessment/Plan:  Ms. Miquel Lamson is a 84 y/o female with PMHx of HTN, CKD IV, CHF, OSA and A.fib presenting with sudden onset dyspnea in setting of increased O2 requirement for 1 week after cardioversion she was found to have acute hypoxic respiratory failure. CT chest with widespread ground glass opacities bilaterally with airway thickening and scattered secretions, possibly ARDS vs infection vs edema.   Acute hypoxic respiratory failure She tolerated CPAP overnight without any difficulties.  This morning during our assessment, she was very comfortable sitting on the bedside recliner and asking for her orange sherbert. She was saturating at 94% on 9L HFNC and  was able to maintain her sats when titrated down to 7L HFNC. Mild crackles diffusely on examination that are improving.  - Prednisone 40mg  qd Day 5/5, will taper tomorrow - Duoneb 74ml q4h  - CPAP overnight and BiPAP prn  - Continue Incentive spirometry - Strict I/O's and daily weights - Continue to encourage OOB and working with PT/OT  Thrombocytopenia in setting of chronic ITP Platelets stable - Appreciate Dr. Antonieta Pert recommendations.  - CBC daily   CKD Stage IV:  sCr this am 1.9  - Continue free water repletion 367ml q8h - Avoid nephrotoxic agents - BMP daily  Hypernatremia-improved sNa of 143 -Continue free water  GOC:  Seems there is discordance between the 2 daughters regarding definitive plans for goals of care.  We will continue to monitor the situation and involve palliative medicine if needed.  QTc Prolongation:  Most recent QTC of 513 - Cardiac monitoring - Diltiazem 180mg  qd - Avoid QT prolonging agents  Dispo: Anticipated discharge pending clinical improvement.   Harvie Heck, MD  Internal Medicine, PGY-1  07/16/2019, 6:44 AM Pager: [phone]

## 2019-07-16 NOTE — Plan of Care (Signed)
  Problem: Education: Goal: Knowledge of General Education information will improve Description Including pain rating scale, medication(s)/side effects and non-pharmacologic comfort measures Outcome: Progressing   Problem: Health Behavior/Discharge Planning: Goal: Ability to manage health-related needs will improve Outcome: Progressing   

## 2019-07-16 NOTE — Progress Notes (Signed)
Patient blood sugar at 1700 reading is 499. MD notified and insulin was readjusted per blood sugar reading.

## 2019-07-16 NOTE — Progress Notes (Signed)
Patient and family refused ramelteon last night as they believe it is having the opposite effect on patient and is causing her to not sleep well. Patient slept well last night. Tonight, they are also refusing medication.

## 2019-07-16 NOTE — Progress Notes (Signed)
Patient and family member have been requesting multiple drinks and ice for patient. RN explained that she can have something to drink, but she may need to limit her fluid intake D/T fluid overload.

## 2019-07-17 LAB — BASIC METABOLIC PANEL
Anion gap: 9 (ref 5–15)
BUN: 114 mg/dL — ABNORMAL HIGH (ref 8–23)
CO2: 39 mmol/L — ABNORMAL HIGH (ref 22–32)
Calcium: 11 mg/dL — ABNORMAL HIGH (ref 8.9–10.3)
Chloride: 95 mmol/L — ABNORMAL LOW (ref 98–111)
Creatinine, Ser: 2.12 mg/dL — ABNORMAL HIGH (ref 0.44–1.00)
GFR calc Af Amer: 25 mL/min — ABNORMAL LOW (ref >60)
GFR calc non Af Amer: 21 mL/min — ABNORMAL LOW (ref >60)
Glucose, Bld: 117 mg/dL — ABNORMAL HIGH (ref 70–99)
Potassium: 4.9 mmol/L (ref 3.5–5.1)
Sodium: 143 mmol/L (ref 135–145)

## 2019-07-17 LAB — CBC
HCT: 29.3 % — ABNORMAL LOW (ref 36.0–46.0)
Hemoglobin: 8.7 g/dL — ABNORMAL LOW (ref 12.0–15.0)
MCH: 28.2 pg (ref 26.0–34.0)
MCHC: 29.7 g/dL — ABNORMAL LOW (ref 30.0–36.0)
MCV: 94.8 fL (ref 80.0–100.0)
Platelets: 155 10*3/uL (ref 150–400)
RBC: 3.09 MIL/uL — ABNORMAL LOW (ref 3.87–5.11)
RDW: 18.6 % — ABNORMAL HIGH (ref 11.5–15.5)
WBC: 10.3 10*3/uL (ref 4.0–10.5)
nRBC: 0.2 % (ref 0.0–0.2)

## 2019-07-17 LAB — GLUCOSE, CAPILLARY
Glucose-Capillary: 110 mg/dL — ABNORMAL HIGH (ref 70–99)
Glucose-Capillary: 167 mg/dL — ABNORMAL HIGH (ref 70–99)
Glucose-Capillary: 180 mg/dL — ABNORMAL HIGH (ref 70–99)
Glucose-Capillary: 82 mg/dL (ref 70–99)

## 2019-07-17 MED ORDER — PREDNISONE 5 MG PO TABS
35.0000 mg | ORAL_TABLET | Freq: Every day | ORAL | Status: DC
  Administered 2019-07-17 – 2019-07-18 (×2): 35 mg via ORAL
  Filled 2019-07-17 (×2): qty 1

## 2019-07-17 NOTE — Progress Notes (Signed)
SATURATION QUALIFICATIONS: (This note is used to comply with regulatory documentation for home oxygen)  Patient Saturations on Room Air at Rest =NT  Patient Saturations on Room Air while Ambulating = NT  Patient Saturations on 6 Liters of oxygen while Ambulating = 85%  Please briefly explain why patient needs home oxygen: De-sats with gait

## 2019-07-17 NOTE — Progress Notes (Signed)
Visited [name] to complete AD Spiritual Consult. Paperwork delivered to patient and daughter. Will follow up for completion of documents.   Rev. [place].

## 2019-07-17 NOTE — Progress Notes (Signed)
Patient is requesting someone to help her with POA documents (mainly wants the document notarized). RN will place chaplin consult.

## 2019-07-17 NOTE — TOC Progression Note (Signed)
Transition of Care [facility]) - Progression Note    Patient Details  Name: [name] MRN: [id number] Date of Birth: 1935-06-06  Transition of Care [facility]) CM/SW [place], [place] Phone Number: 07/17/2019, 2:57 PM  Clinical Narrative:     Patient medically ready possibly on Tuesday. The patient is currently on 6L of 02. CSW called and left a message for Luellen Pucker at [facility] asking if the could accommodate 6L. CSW is awaiting a return phone call.   CSW will continue to follow and assist with disposition planning.   Expected Discharge Plan: Assisted Living Barriers to Discharge: Continued Medical Work up  Expected Discharge Plan and Services Expected Discharge Plan: Assisted Living       Living arrangements for the past 2 months: Assisted Living Facility([facility])                                       Social Determinants of Health (SDOH) Interventions    Readmission Risk Interventions No flowsheet data found.

## 2019-07-17 NOTE — Progress Notes (Signed)
Physical Therapy Treatment Patient Details Name: [name] MRN: [id number] DOB: 1935/08/14 Today's Date: 07/17/2019    History of Present Illness 84yo F with extensive past medical history presented SOB with increased oxygen demands, requiring initiation of BIPAP therapy. Patient has been unable to wean from NIV therapy over 2 days.  PCCM consulted for further management.     PT Comments    Pt ambulated 4' x 2, once on 6 L/min and once on 8 L/min. She fatigues quickly and needs to sit due to dyspnea.  On 6 L/min she de-sat to 85% and on 8 L/min stayed 89-91%.  Con't to recommend SNF rehab.     Follow Up Recommendations  SNF     Equipment Recommendations  None recommended by PT    Recommendations for Other Services       Precautions / Restrictions Precautions Precautions: Fall Restrictions Weight Bearing Restrictions: No    Mobility  Bed Mobility               General bed mobility comments: pt OOB in chair upon arrival. sleeps in recliner at home per daughter  Transfers Overall transfer level: Needs assistance Equipment used: Rolling walker (2 wheeled) Transfers: Sit to/from Stand Sit to Stand: Min guard         General transfer comment: min/guard to stand today.  Ambulation/Gait Ambulation/Gait assistance: Min assist;+2 safety/equipment Gait Distance (Feet): 4 Feet(x2) Assistive device: Rolling walker (2 wheeled) Gait Pattern/deviations: Step-to pattern;Decreased step length - right;Decreased step length - left;Trunk flexed Gait velocity: decr   General Gait Details: Amb 4' on 6 L/min and dropped to 85% and fluctutated between 87-85% with pursed lip breathing in sitting. Increased to 8 L/min and ambulated 4' again with o2 89-91%.  O2 re-applied at 6 L/min and was 91% for 1-2 minutes and then dropped to 85%.  Nursing in room at time of exit.   Stairs             Wheelchair Mobility    Modified Rankin (Stroke Patients Only)        Balance           Standing balance support: Bilateral upper extremity supported;During functional activity Standing balance-Leahy Scale: Poor Standing balance comment: requires UE support                            Cognition Arousal/Alertness: Awake/alert Behavior During Therapy: WFL for tasks assessed/performed Overall Cognitive Status: Within Functional Limits for tasks assessed                                        Exercises      General Comments        Pertinent Vitals/Pain Faces Pain Scale: Hurts little more Pain Location: generalized, grimacing with movement Pain Descriptors / Indicators: Aching Pain Intervention(s): Limited activity within patient's tolerance;Repositioned;Monitored during session    Home Living                      Prior Function            PT Goals (current goals can now be found in the care plan section) Acute Rehab PT Goals Potential to Achieve Goals: Good Progress towards PT goals: Progressing toward goals    Frequency    Min 3X/week      PT Plan Current  plan remains appropriate    Co-evaluation              AM-PAC PT "6 Clicks" Mobility   Outcome Measure  Help needed turning from your back to your side while in a flat bed without using bedrails?: A Lot Help needed moving from lying on your back to sitting on the side of a flat bed without using bedrails?: A Lot Help needed moving to and from a bed to a chair (including a wheelchair)?: A Little Help needed standing up from a chair using your arms (e.g., wheelchair or bedside chair)?: A Little Help needed to walk in hospital room?: A Lot Help needed climbing 3-5 steps with a railing? : Total 6 Click Score: 13    End of Session Equipment Utilized During Treatment: Gait belt;Oxygen Activity Tolerance: Patient limited by fatigue Patient left: in chair;with call bell/phone within reach;with nursing/sitter in room;with family/visitor  present Nurse Communication: Mobility status PT Visit Diagnosis: Muscle weakness (generalized) (M62.81)     Time: 0110-0349 PT Time Calculation (min) (ACUTE ONLY): 20 min  Charges:  $Gait Training: 8-22 mins                     Dorreen Valiente L. Tamala Julian, [place] Pager [phone] 07/17/2019    Galen Manila 07/17/2019, 12:51 PM

## 2019-07-17 NOTE — Progress Notes (Signed)
   Subjective:  [name] was examined at bedside this morning with her daughter, [name], at bedside. She is sitting in her bedside recliner. She is somnolent during examination but reports that she is feeling better today. She is saturating 91-92% on 6L HFNC. Discussed with patient and daughter about discharge planning back to SNF pending PT evaluation.   Objective:  Vital signs in last 24 hours: Vitals:   07/16/19 2014 07/16/19 2036 07/17/19 0023 07/17/19 0349  BP:  (!) 154/62 (!) 143/52 139/60  Pulse: 68 68 62 60  Resp: 20 18 20 20   Temp:  (!) 97.5 F (36.4 C) 98.5 F (36.9 C) 97.9 F (36.6 C)  TempSrc:  Oral Oral Oral  SpO2: 94% 100% 100% 100%  Weight:    107.7 kg  Height:       Physical Exam Constitutional:      General: She is not in acute distress.    Appearance: She is obese. She is not ill-appearing.  Cardiovascular:     Rate and Rhythm: Normal rate and regular rhythm.     Pulses: Normal pulses.     Heart sounds: Normal heart sounds.  Pulmonary:     Effort: Pulmonary effort is normal. No accessory muscle usage or respiratory distress.     Breath sounds: Normal breath sounds.  Chest:     Chest wall: No mass, tenderness or crepitus.  Neurological:     Mental Status: She is alert and oriented to person, place, and time.     Assessment/Plan:  [name] is a 84 y/o female with PMHx of HTN, CKD IV, CHF, OSA and A.fib presenting with sudden onset dyspnea in setting of increased O2 requirement for 1 week after cardioversion she was found to have acute hypoxic respiratory failure. CT chest with widespread ground glass opacities bilaterally with airway thickening and scattered secretions, possibly ARDS vs infection vs edema. Her clinical status is improved with decreasing oxygen requirements and is continued on the prednisone taper.   Acute hypoxic respiratory failure She tolerated CPAP overnight without any difficulties.  This morning during our assessment, she was  very comfortable sitting on the bedside recliner and asking for her orange sherbert. She was on 6L HFNC yesterday and tolerating well. This morning, she is continued on 6L HFNC and continues to saturate 91-92%. She is somnolent on examination but easily arousable. She is agreeable with working with PT today to monitor oxygen saturation. Discussed transition back to SNF. Will discuss with SW today and possible discharge in the next few days. She will need a COVID test prior to discharge.  - Prednisone 35mg  qd, Day 1/5 - Duoneb 62ml q4h  - CPAP overnight and BiPAP prn  - Continue Incentive spirometry - Strict I/O's and daily weights - Continue to encourage OOB and working with PT/OT  Thrombocytopenia in setting of chronic ITP Platelets stable - Appreciate Dr. Antonieta Pert recommendations.  - CBC daily   CKD Stage IV:  sCr this am 2.1 - Continue free water repletion 360ml q8h - Avoid nephrotoxic agents - BMP daily  QTc Prolongation:  - Cardiac monitoring - Diltiazem 180mg  qd - Avoid QT prolonging agents  Dispo: Anticipated discharge in 1-2 days.   Harvie Heck, MD  Internal Medicine, PGY-1  07/17/2019, 6:07 AM Pager: [phone]

## 2019-07-17 NOTE — Plan of Care (Signed)

## 2019-07-17 NOTE — TOC Progression Note (Signed)
Transition of Care [facility]) - Progression Note    Patient Details  Name: [name] MRN: [id number] Date of Birth: 10/24/1934  [facility]) CM/SW [place], [place] Phone Number: 07/17/2019, 4:51 PM  Clinical Narrative:     CSW heard back from [facility]. They are able to accommodate the patient being on 6L of 02. They will have to order the oxygen concentrator.   CSW will continue to follow and assist with discharge planning.   Expected Discharge Plan: Assisted Living Barriers to Discharge: Continued Medical Work up  Expected Discharge Plan and Services Expected Discharge Plan: Assisted Living       Living arrangements for the past 2 months: Assisted Living Facility([facility])                                       Social Determinants of Health (SDOH) Interventions    Readmission Risk Interventions No flowsheet data found.

## 2019-07-18 DIAGNOSIS — I130 Hypertensive heart and chronic kidney disease with heart failure and stage 1 through stage 4 chronic kidney disease, or unspecified chronic kidney disease: Secondary | ICD-10-CM | POA: Diagnosis not present

## 2019-07-18 DIAGNOSIS — E785 Hyperlipidemia, unspecified: Secondary | ICD-10-CM | POA: Diagnosis present

## 2019-07-18 DIAGNOSIS — J690 Pneumonitis due to inhalation of food and vomit: Secondary | ICD-10-CM | POA: Diagnosis not present

## 2019-07-18 DIAGNOSIS — Z6841 Body Mass Index (BMI) 40.0 and over, adult: Secondary | ICD-10-CM | POA: Diagnosis not present

## 2019-07-18 DIAGNOSIS — I10 Essential (primary) hypertension: Secondary | ICD-10-CM | POA: Diagnosis not present

## 2019-07-18 DIAGNOSIS — I872 Venous insufficiency (chronic) (peripheral): Secondary | ICD-10-CM | POA: Diagnosis not present

## 2019-07-18 DIAGNOSIS — R609 Edema, unspecified: Secondary | ICD-10-CM | POA: Diagnosis not present

## 2019-07-18 DIAGNOSIS — Z66 Do not resuscitate: Secondary | ICD-10-CM | POA: Diagnosis not present

## 2019-07-18 DIAGNOSIS — E1122 Type 2 diabetes mellitus with diabetic chronic kidney disease: Secondary | ICD-10-CM | POA: Diagnosis present

## 2019-07-18 DIAGNOSIS — Z20828 Contact with and (suspected) exposure to other viral communicable diseases: Secondary | ICD-10-CM | POA: Diagnosis not present

## 2019-07-18 DIAGNOSIS — I484 Atypical atrial flutter: Secondary | ICD-10-CM | POA: Diagnosis not present

## 2019-07-18 DIAGNOSIS — I161 Hypertensive emergency: Secondary | ICD-10-CM | POA: Diagnosis not present

## 2019-07-18 DIAGNOSIS — E890 Postprocedural hypothyroidism: Secondary | ICD-10-CM | POA: Diagnosis not present

## 2019-07-18 DIAGNOSIS — E872 Acidosis: Secondary | ICD-10-CM | POA: Diagnosis not present

## 2019-07-18 DIAGNOSIS — J9691 Respiratory failure, unspecified with hypoxia: Secondary | ICD-10-CM | POA: Diagnosis not present

## 2019-07-18 DIAGNOSIS — I959 Hypotension, unspecified: Secondary | ICD-10-CM | POA: Diagnosis not present

## 2019-07-18 DIAGNOSIS — I5030 Unspecified diastolic (congestive) heart failure: Secondary | ICD-10-CM | POA: Diagnosis not present

## 2019-07-18 DIAGNOSIS — D631 Anemia in chronic kidney disease: Secondary | ICD-10-CM | POA: Diagnosis present

## 2019-07-18 DIAGNOSIS — R0689 Other abnormalities of breathing: Secondary | ICD-10-CM | POA: Diagnosis not present

## 2019-07-18 DIAGNOSIS — I480 Paroxysmal atrial fibrillation: Secondary | ICD-10-CM | POA: Diagnosis present

## 2019-07-18 DIAGNOSIS — R262 Difficulty in walking, not elsewhere classified: Secondary | ICD-10-CM | POA: Insufficient documentation

## 2019-07-18 DIAGNOSIS — G4733 Obstructive sleep apnea (adult) (pediatric): Secondary | ICD-10-CM | POA: Diagnosis not present

## 2019-07-18 DIAGNOSIS — N179 Acute kidney failure, unspecified: Secondary | ICD-10-CM | POA: Diagnosis present

## 2019-07-18 DIAGNOSIS — M2550 Pain in unspecified joint: Secondary | ICD-10-CM | POA: Diagnosis not present

## 2019-07-18 DIAGNOSIS — R9431 Abnormal electrocardiogram [ECG] [EKG]: Secondary | ICD-10-CM | POA: Diagnosis not present

## 2019-07-18 DIAGNOSIS — Z882 Allergy status to sulfonamides status: Secondary | ICD-10-CM

## 2019-07-18 DIAGNOSIS — R0600 Dyspnea, unspecified: Secondary | ICD-10-CM | POA: Diagnosis not present

## 2019-07-18 DIAGNOSIS — E875 Hyperkalemia: Secondary | ICD-10-CM | POA: Diagnosis not present

## 2019-07-18 DIAGNOSIS — E7800 Pure hypercholesterolemia, unspecified: Secondary | ICD-10-CM | POA: Diagnosis not present

## 2019-07-18 DIAGNOSIS — J9621 Acute and chronic respiratory failure with hypoxia: Secondary | ICD-10-CM | POA: Diagnosis not present

## 2019-07-18 DIAGNOSIS — Z7189 Other specified counseling: Secondary | ICD-10-CM | POA: Diagnosis not present

## 2019-07-18 DIAGNOSIS — Y95 Nosocomial condition: Secondary | ICD-10-CM | POA: Diagnosis present

## 2019-07-18 DIAGNOSIS — Z91011 Allergy to milk products: Secondary | ICD-10-CM

## 2019-07-18 DIAGNOSIS — J9600 Acute respiratory failure, unspecified whether with hypoxia or hypercapnia: Secondary | ICD-10-CM | POA: Diagnosis not present

## 2019-07-18 DIAGNOSIS — R0902 Hypoxemia: Secondary | ICD-10-CM | POA: Diagnosis not present

## 2019-07-18 DIAGNOSIS — E039 Hypothyroidism, unspecified: Secondary | ICD-10-CM | POA: Diagnosis present

## 2019-07-18 DIAGNOSIS — R000 Tachycardia, unspecified: Secondary | ICD-10-CM | POA: Diagnosis not present

## 2019-07-18 DIAGNOSIS — I482 Chronic atrial fibrillation, unspecified: Secondary | ICD-10-CM | POA: Diagnosis present

## 2019-07-18 DIAGNOSIS — J189 Pneumonia, unspecified organism: Secondary | ICD-10-CM | POA: Diagnosis not present

## 2019-07-18 DIAGNOSIS — K625 Hemorrhage of anus and rectum: Secondary | ICD-10-CM | POA: Diagnosis not present

## 2019-07-18 DIAGNOSIS — R1312 Dysphagia, oropharyngeal phase: Secondary | ICD-10-CM | POA: Insufficient documentation

## 2019-07-18 DIAGNOSIS — E870 Hyperosmolality and hypernatremia: Secondary | ICD-10-CM | POA: Diagnosis not present

## 2019-07-18 DIAGNOSIS — R042 Hemoptysis: Secondary | ICD-10-CM | POA: Diagnosis present

## 2019-07-18 DIAGNOSIS — D693 Immune thrombocytopenic purpura: Secondary | ICD-10-CM | POA: Diagnosis not present

## 2019-07-18 DIAGNOSIS — I129 Hypertensive chronic kidney disease with stage 1 through stage 4 chronic kidney disease, or unspecified chronic kidney disease: Secondary | ICD-10-CM | POA: Diagnosis not present

## 2019-07-18 DIAGNOSIS — I5032 Chronic diastolic (congestive) heart failure: Secondary | ICD-10-CM | POA: Diagnosis not present

## 2019-07-18 DIAGNOSIS — Z7401 Bed confinement status: Secondary | ICD-10-CM | POA: Diagnosis not present

## 2019-07-18 DIAGNOSIS — J9622 Acute and chronic respiratory failure with hypercapnia: Secondary | ICD-10-CM | POA: Diagnosis present

## 2019-07-18 DIAGNOSIS — I248 Other forms of acute ischemic heart disease: Secondary | ICD-10-CM | POA: Diagnosis present

## 2019-07-18 DIAGNOSIS — F330 Major depressive disorder, recurrent, mild: Secondary | ICD-10-CM | POA: Diagnosis not present

## 2019-07-18 DIAGNOSIS — N184 Chronic kidney disease, stage 4 (severe): Secondary | ICD-10-CM | POA: Diagnosis not present

## 2019-07-18 DIAGNOSIS — R739 Hyperglycemia, unspecified: Secondary | ICD-10-CM | POA: Diagnosis not present

## 2019-07-18 DIAGNOSIS — R0602 Shortness of breath: Secondary | ICD-10-CM | POA: Diagnosis not present

## 2019-07-18 DIAGNOSIS — D696 Thrombocytopenia, unspecified: Secondary | ICD-10-CM | POA: Diagnosis not present

## 2019-07-18 DIAGNOSIS — Z9989 Dependence on other enabling machines and devices: Secondary | ICD-10-CM | POA: Diagnosis not present

## 2019-07-18 DIAGNOSIS — Z7952 Long term (current) use of systemic steroids: Secondary | ICD-10-CM | POA: Diagnosis not present

## 2019-07-18 DIAGNOSIS — E871 Hypo-osmolality and hyponatremia: Secondary | ICD-10-CM | POA: Diagnosis present

## 2019-07-18 DIAGNOSIS — J9601 Acute respiratory failure with hypoxia: Secondary | ICD-10-CM | POA: Diagnosis not present

## 2019-07-18 DIAGNOSIS — M6281 Muscle weakness (generalized): Secondary | ICD-10-CM | POA: Diagnosis not present

## 2019-07-18 DIAGNOSIS — N189 Chronic kidney disease, unspecified: Secondary | ICD-10-CM | POA: Diagnosis not present

## 2019-07-18 DIAGNOSIS — Z515 Encounter for palliative care: Secondary | ICD-10-CM | POA: Diagnosis not present

## 2019-07-18 DIAGNOSIS — I5042 Chronic combined systolic (congestive) and diastolic (congestive) heart failure: Secondary | ICD-10-CM | POA: Diagnosis present

## 2019-07-18 DIAGNOSIS — I5033 Acute on chronic diastolic (congestive) heart failure: Secondary | ICD-10-CM | POA: Diagnosis not present

## 2019-07-18 DIAGNOSIS — D649 Anemia, unspecified: Secondary | ICD-10-CM | POA: Diagnosis not present

## 2019-07-18 LAB — BASIC METABOLIC PANEL
Anion gap: 11 (ref 5–15)
BUN: 112 mg/dL — ABNORMAL HIGH (ref 8–23)
CO2: 35 mmol/L — ABNORMAL HIGH (ref 22–32)
Calcium: 10.6 mg/dL — ABNORMAL HIGH (ref 8.9–10.3)
Chloride: 95 mmol/L — ABNORMAL LOW (ref 98–111)
Creatinine, Ser: 1.99 mg/dL — ABNORMAL HIGH (ref 0.44–1.00)
GFR calc Af Amer: 27 mL/min — ABNORMAL LOW (ref >60)
GFR calc non Af Amer: 23 mL/min — ABNORMAL LOW (ref >60)
Glucose, Bld: 165 mg/dL — ABNORMAL HIGH (ref 70–99)
Potassium: 5.6 mmol/L — ABNORMAL HIGH (ref 3.5–5.1)
Sodium: 141 mmol/L (ref 135–145)

## 2019-07-18 LAB — SARS CORONAVIRUS 2 (TAT 6-24 HRS): SARS Coronavirus 2: NEGATIVE

## 2019-07-18 LAB — CBC
HCT: 27.7 % — ABNORMAL LOW (ref 36.0–46.0)
Hemoglobin: 8.5 g/dL — ABNORMAL LOW (ref 12.0–15.0)
MCH: 28.9 pg (ref 26.0–34.0)
MCHC: 30.7 g/dL (ref 30.0–36.0)
MCV: 94.2 fL (ref 80.0–100.0)
Platelets: 143 10*3/uL — ABNORMAL LOW (ref 150–400)
RBC: 2.94 MIL/uL — ABNORMAL LOW (ref 3.87–5.11)
RDW: 18.7 % — ABNORMAL HIGH (ref 11.5–15.5)
WBC: 11.5 10*3/uL — ABNORMAL HIGH (ref 4.0–10.5)
nRBC: 0 % (ref 0.0–0.2)

## 2019-07-18 LAB — GLUCOSE, CAPILLARY
Glucose-Capillary: 159 mg/dL — ABNORMAL HIGH (ref 70–99)
Glucose-Capillary: 366 mg/dL — ABNORMAL HIGH (ref 70–99)
Glucose-Capillary: 327 mg/dL — ABNORMAL HIGH (ref 70–99)

## 2019-07-18 MED ORDER — PREDNISONE 5 MG PO TABS: ORAL_TABLET | ORAL | 0 refills | Status: DC

## 2019-07-18 MED ORDER — DARBEPOETIN ALFA 300 MCG/0.6ML IJ SOSY
300.0000 ug | PREFILLED_SYRINGE | Freq: Once | INTRAMUSCULAR | Status: AC
  Administered 2019-07-18: 300 ug via SUBCUTANEOUS
  Filled 2019-07-18: qty 0.6

## 2019-07-18 MED ORDER — SODIUM ZIRCONIUM CYCLOSILICATE 10 G PO PACK
10.0000 g | PACK | Freq: Once | ORAL | Status: AC
  Administered 2019-07-18: 10 g via ORAL
  Filled 2019-07-18: qty 1

## 2019-07-18 MED ORDER — ROMIPLOSTIM 250 MCG ~~LOC~~ SOLR
1.0000 ug/kg | Freq: Once | SUBCUTANEOUS | Status: AC
  Administered 2019-07-18: 110 ug via SUBCUTANEOUS
  Filled 2019-07-18: qty 0.22

## 2019-07-18 NOTE — TOC Transition Note (Signed)
Transition of Care [facility]) - CM/SW Discharge Note   Patient Details  Name: [name] MRN: [id number] Date of Birth: 09-09-1934  Transition of Care [facility]) CM/SW Contact:  Alberteen Sam, LCSW Phone Number: 07/18/2019, 5:05 PM   Clinical Narrative:     Patient will DC to: [facility] Anticipated DC date: 07/18/2019 Family notified:[name] OX:BDZH  Per MD patient ready for DC to [facility]. RN, patient, patient's family, and facility notified of DC. Discharge Summary sent to facility. RN given number for report    [phone] Room 307. DC packet on chart. Ambulance transport requested for patient.  CSW signing off.  [place], [place]   Final next level of care: Skilled Nursing Facility Barriers to Discharge: No Barriers Identified   Patient Goals and CMS Choice Patient states their goals for this hospitalization and ongoing recovery are:: to go to rehab CMS Medicare.gov Compare Post Acute Care list provided to:: Patient Choice offered to / list presented to : Patient  Discharge Placement PASRR number recieved: 07/12/19            Patient chooses bed at: [facility] Patient to be transferred to facility by: [place] Name of family member notified: [name] Patient and family notified of of transfer: 07/18/19  Discharge Plan and Services                                     Social Determinants of Health (SDOH) Interventions     Readmission Risk Interventions No flowsheet data found.

## 2019-07-18 NOTE — Progress Notes (Signed)
Report given to the RN at [facility], all questions answered.

## 2019-07-18 NOTE — Progress Notes (Signed)
   Subjective:  Ms. Greenhouse was examined at bedside this morning with her daughter, [name], at bedside. She is sitting in her bedside recliner.She is alert and saturating around 90% on 5L [place]. She reports feeling much better. Discussed with patient and daughter about discharge planning back to SNF pending COVID testing. They are agreeable with the plan.   Objective:  Vital signs in last 24 hours: Vitals:   07/18/19 0044 07/18/19 0345 07/18/19 0738 07/18/19 0804  BP: (!) 153/50 (!) 146/46  (!) 156/48  Pulse: (!) 57 (!) 57  62  Resp: 20 20  18   Temp:    98 F (36.7 C)  TempSrc:      SpO2: 100% 98% 90% 92%  Weight:  108.3 kg    Height:       Physical Exam Constitutional:      General: She is not in acute distress.    Appearance: She is obese. She is not ill-appearing.  Cardiovascular:     Rate and Rhythm: Normal rate and regular rhythm.     Pulses: Normal pulses.     Heart sounds: Normal heart sounds.  Pulmonary:     Effort: Pulmonary effort is normal. No accessory muscle usage or respiratory distress.     Breath sounds: Normal breath sounds.  Chest:     Chest wall: No mass, tenderness or crepitus.  Neurological:     Mental Status: She is alert and oriented to person, place, and time.     Assessment/Plan:  Ms. Barbarann Kelly is a 84 y/o female with PMHx of HTN, CKD IV, CHF, OSA and A.fib presenting with sudden onset dyspnea in setting of increased O2 requirement for 1 week after cardioversion she was found to have acute hypoxic respiratory failure. CT chest with widespread ground glass opacities bilaterally with airway thickening and scattered secretions, possibly ARDS vs infection vs edema. Her clinical status is improved with decreasing oxygen requirements and is continued on the prednisone taper.   Acute hypoxic respiratory failure Ms. Ferner continues to saturate >90% on 5L [place] this morning. She used CPAP overnight and tolerated it well (close to her baseline setting). This  morning during our assessment, she was very comfortable sitting on the bedside recliner. Discussed transition back to SNF. She is to continue prednisone taper as outpatient. Patient and daughter at bedside agreeable with the plan. Appreciate SW working with the SNF to get her back home.  - Prednisone 35mg  qd, Day 2/5 - COVID testing to discharge to SNF - Duoneb 25ml q4h  - Continue Incentive spirometry - Strict I/O's and daily weights  Hyperkalemia:  K 5.6 today. Given her history of QTc prolongation, obtained EKG. No significant changes noted on EKG. She was given one dose of lokelma. - BMP  Thrombocytopenia in setting of chronic ITP Platelets stable at 143. Hb 8.5. Patient received one dose of N-plate and Aranesp today, per Dr. Jonette Eva.  - Appreciate Dr. Antonieta Pert recommendations.  - CBC   CKD Stage IV:  sCr this am 1.9 - Continue free water repletion 356ml q8h - Avoid nephrotoxic agents - BMP daily  QTc Prolongation:  - Cardiac monitoring - Diltiazem 180mg  qd - Avoid QT prolonging agents  Dispo: Anticipated discharge in 0-1 days.   Harvie Heck, MD  Internal Medicine, PGY-1  07/18/2019, 11:16 AM Pager: [phone]

## 2019-07-18 NOTE — Progress Notes (Signed)
Overall, it looks like [name] is slowly improving.  Looks like her oxygen requirements are coming down.  Her CBC today shows that her white cell count 11.5.  Hemoglobin 8.5.  Platelet count 143,000.  Her BUN is 112 creatinine 1.99.  She definitely needs another dose of Nplate.  She also needs a dose of Aranesp.  Given her overall status with the oxygen issues and with her renal insufficiency, it would not surprise me if she as needed to be transfused.  No x-ray has been done for a week.  I would think that this would be helpful to see how things are going.  Her appetite is still on the low side.  There is no problems with diarrhea.  She continues on the Eliquis.  I do not think there is been any bleeding.  Again, her red cell count is slowly trending downward.  I would think that Aranesp will help with this.  [name], MD  Psalm 139:14

## 2019-07-18 NOTE — Discharge Summary (Addendum)
Name: [name] MRN: [id number] DOB: 07-Oct-1934 84 y.o. PCP: Javier Glazier, MD  Date of Admission: 07/03/2019  5:11 PM Date of Discharge: 07/18/2019 Attending Physician: Axel Filler, *  Discharge Diagnosis: 1. Acute hypoxic respiratory failure  2. Pneumonitis, unknown etiology 3. Chronic ITP 4. CKD stage 3 5. Chronic normocytic anemia of renal disease 6. Chronic stable atrial fibrillation  Discharge Medications: Allergies as of 07/18/2019       Reactions   Adhesive [tape] Other (See Comments)   PULLS OFF THE SKIN   Apixaban Other (See Comments)   Internal Bleeding   Aspirin Itching, Rash, Hives, Swelling   Swelling of her tongue   Mirabegron Other (See Comments)   Patient experienced A-Fib   Pineapple Anaphylaxis, Swelling   Throat swells and blisters on tongue and roof of mouth per patient   Metformin Diarrhea, Nausea Only   Tetracycline Hives   Fluticasone-salmeterol Itching, Rash   Iodinated Diagnostic Agents Itching, Rash      Lactose Intolerance (gi) Other (See Comments)   Gas   Latex Itching, Rash, Other (See Comments)   Pulls off the skin and causes welts   Lisinopril Cough   Metronidazole Other (See Comments)   Reaction not known   Sulfa Antibiotics Rash   Sulfonamide Derivatives Itching, Rash        Medication List     TAKE these medications    acetaminophen 325 MG tablet Commonly known as: TYLENOL Take 650 mg by mouth every 6 (six) hours as needed for moderate pain or fever (for fever or pain/discomfort).   amoxicillin 500 MG capsule Commonly known as: AMOXIL Take 1,000-2,000 mg by mouth See admin instructions. Take 1,000 mg by mouth one hour before dental appointments and 1,000 mg afterwards AS DIRECTED   apixaban 2.5 MG Tabs tablet Commonly known as: ELIQUIS Take 1 tablet (2.5 mg total) by mouth 2 (two) times daily.   atorvastatin 40 MG tablet Commonly known as: LIPITOR Take 40 mg by mouth at bedtime.   Banophen  25 MG tablet Generic drug: diphenhydrAMINE Take 25 mg by mouth as needed ("for adverse food reactions").   bisacodyl 5 MG EC tablet Commonly known as: DULCOLAX Take 10 mg by mouth every 3 (three) days as needed (for constipation).   diltiazem 180 MG 24 hr capsule Commonly known as: DILACOR XR Take 180 mg by mouth every evening.   docusate sodium 100 MG capsule Commonly known as: COLACE Take 100 mg by mouth daily.   Dulaglutide 1.5 MG/0.5ML Sopn Commonly known as: Trulicity Inject 1.5 mg into the skin once a week. What changed: when to take this   fenofibrate 54 MG tablet TAKE 1 TABLET(54 MG) BY MOUTH DAILY What changed: See the new instructions.   fexofenadine 180 MG tablet Commonly known as: ALLEGRA Take 180 mg by mouth at bedtime.   Flaxseed Oil 1000 MG Caps Take 1,000 mg by mouth 2 (two) times daily.   FreeStyle Libre 14 Day Sensor Misc Inject 1 patch into the skin every 14 (fourteen) days.   furosemide 20 MG tablet Commonly known as: LASIX Take 20 mg by mouth See admin instructions. Take 20 mg by mouth in the morning on Sun/Tues/Thurs/Sat   furosemide 40 MG tablet Commonly known as: LASIX Take 40 mg by mouth See admin instructions. Take 40 mg by mouth in the morning on Mon/Wed/Fri   HumaLOG 100 UNIT/ML injection Generic drug: insulin lispro Inject 25-35 Units into the skin See admin instructions. "Inject 35  units into the skin before breakfast, 30 units before lunch, and 25 units before supper, per sliding scale: BGL >90 = give the full dose for a regular meal, 60-90 = 17 units before breakfast, 15 units before lunch, and 12 units before supper; hold if <60"   insulin glargine 100 UNIT/ML injection Commonly known as: Lantus Inject 0.4 mLs (40 Units total) into the skin daily. What changed: when to take this   Insulin Pen Needle 31G X 5 MM Misc Commonly known as: B-D UF III MINI PEN NEEDLES Use daily for insulin injection   INSULIN SYRINGE 1CC/30GX5/16" 30G  X 5/16" 1 ML Misc 2 (two) times daily.   levothyroxine 112 MCG tablet Commonly known as: SYNTHROID TAKE 1 TABLET BY MOUTH DAILY What changed: when to take this   multivitamin tablet Take 1 tablet by mouth daily.   omeprazole 20 MG capsule Commonly known as: PRILOSEC Take 20 mg by mouth daily.   ondansetron 4 MG tablet Commonly known as: ZOFRAN Take 4 mg by mouth every 8 (eight) hours as needed for nausea or vomiting.   oxymetazoline 0.05 % nasal spray Commonly known as: AFRIN Place 2 sprays into both nostrils every 12 (twelve) hours as needed (for nose bleeds).   polyethylene glycol 17 g packet Commonly known as: MIRALAX / GLYCOLAX Take 17 g by mouth daily as needed (for constipation).   potassium citrate 10 MEQ (1080 MG) SR tablet Commonly known as: UROCIT-K Take 10 mEq by mouth 2 (two) times daily.   predniSONE 5 MG tablet Commonly known as: DELTASONE Take 7 tablets (35 mg total) by mouth daily with breakfast for 3 days, THEN 6 tablets (30 mg total) daily with breakfast for 5 days, THEN 5 tablets (25 mg total) daily with breakfast for 5 days, THEN 4 tablets (20 mg total) daily with breakfast for 5 days, THEN 3 tablets (15 mg total) daily with breakfast for 5 days, THEN 2 tablets (10 mg total) daily with breakfast for 5 days, THEN 1 tablet (5 mg total) daily with breakfast for 5 days. Start taking on: July 19, 2019   sertraline 100 MG tablet Commonly known as: ZOLOFT Take 100 mg by mouth every evening.   Tylenol Sinus Severe 5-325-200 MG Tabs Generic drug: Phenylephrine-APAP-guaiFENesin Take 2 tablets by mouth every 4 (four) hours as needed (sinus headaches).   vitamin C 250 MG tablet Commonly known as: ASCORBIC ACID Take 250 mg by mouth daily.   Vitamin D3 50 MCG (2000 UT) Tabs Take 2,000 Units by mouth daily.        Disposition and follow-up:   [name] was discharged from [facility] in Stable condition.  At the hospital  follow up visit please address:  1.  Acute hypoxic respiratory failure:  Patient presented with acute hypoxic respiratory failure requiring BiPAP support. Suspected that her symptoms were secondary to pneumonitis. She was started on high dose steroids and has subsequently been weaned down. Her oxygen requirement has been weaned and she is now on 4-6L O2 via [place].  - Please continue to provide oxygen support as necessary up to 6L via [place] to maintain saturations of 90-94%  - Please continue prednisone taper as follows:  Day 1: 35mg  Day 2: 35mg  Day 3: 35mg  Day 4: 30mg  Day 5:30 mg Day 6: 30mg   Day 7: 30 mg Day 8:  30mg  Day 9: 25 mg  Day 10: 25 mg  Day 11: 25 mg  Day 12: 25 mg  Day 13: 25 mg  Day 14: 20 mg Day 15: 20 mg Day 16: 20 mg Day 17: 20 mg Day 18: 20 mg Day 19: 15 mg Day 20: 15 mg Day 21: 15 mg Day 22: 15 mg Day 23: 15 mg  Day 24: 10 mg Day 25: 10 mg Day 26: 10 mg Day 27: 10 mg Day 28: 10 mg Day 29:  5 mg Day 30:  5 mg Day 31: 5 mg Day 32: 5 mg  Day 33: 5 mg  Thrombocytopenia: Thrombocytopenia in setting of chronic ITP. Patient has received two doses of N-plate and Aranesp during her hospitalization per Dr. Marin Olp. Please continue to platelets and follow up with Dr. Marin Olp.  - CBC  - Monitoring with Dr. Marin Olp   2.  Labs / imaging needed at time of follow-up: BMP, CBC  3.  Pending labs/ test needing follow-up: none   Follow-up Appointments: Follow-up Information     Javier Glazier, MD. Schedule an appointment as soon as possible for a visit in 1 week(s).   Specialty: Internal Medicine Contact information: [place] [address] [place] Hospital Course by problem list: 1. Acute hypoxic respiratory failure:  Ms. Angell was admitted for progressively worsening dyspnea on exertion over one week duration after recent cardioversion for atrial flutter. She was admitted for sudden worsening of shortness of breath. She was  placed on BiPAP for her hypoxia and increased work of breathing.  CXR demonstrated diffuse bilateral airspace opacities representative of pulmonary edema vs multifocal pneumonia vs ARDS vs. vasculitis. CT Chest significant for bilateral ground glass opacities with associated diffuse airway thickening and secretions suggestive of ARDS. PCCM consulted and patient started on high dose steroids. She subsequently improved during her hospitalization and her oxygen requirement was weaned.  Patient was too unstable for invasive procedures such as bronchoscopy or biopsy during hospitalization. Suspect cause of her respiratory failure to be pneumonitis. She is continued on prednisone taper to be completed as outpatient. She has clinically improved and has tolerated activity with physical therapy with maintaining SpO2 >88%. Patient to be discharged to SNF with continued prednisone taper for one month and oxygen requirement of 4-6L to keep SpO2 >88%.    2. Thrombocytopenia: Thrombocytopenia in setting of chronic ITP. Patient has received two doses of N-plate and Aranesp during her hospitalization per Dr. Marin Olp. Please continue to platelets and follow up with Dr. Marin Olp.   Discharge Vitals:   BP (!) 164/49    Pulse 71    Temp 97.7 F (36.5 C) (Oral)    Resp 18    Ht 5\' 1"  (1.549 m)    Wt 108.3 kg    LMP  (LMP Unknown)    SpO2 90%    BMI 45.12 kg/m   Pertinent Labs, Studies, and Procedures:  CBC Latest Ref Rng & Units 07/18/2019 07/17/2019 07/16/2019  WBC 4.0 - 10.5 K/uL 11.5(H) 10.3 14.1(H)  Hemoglobin 12.0 - 15.0 g/dL 8.5(L) 8.7(L) 10.2(L)  Hematocrit 36.0 - 46.0 % 27.7(L) 29.3(L) 33.8(L)  Platelets 150 - 400 K/uL 143(L) 155 180   BMP Latest Ref Rng & Units 07/18/2019 07/17/2019 07/16/2019  Glucose 70 - 99 mg/dL 165(H) 117(H) 207(H)  BUN 8 - 23 mg/dL 112(H) 114(H) 114(H)  Creatinine 0.44 - 1.00 mg/dL 1.99(H) 2.12(H) 1.96(H)  BUN/Creat Ratio 12 - 28 - - -  Sodium 135 - 145 mmol/L 141 143 143  Potassium  3.5 -  5.1 mmol/L 5.6(H) 4.9 5.1  Chloride 98 - 111 mmol/L 95(L) 95(L) 95(L)  CO2 22 - 32 mmol/L 35(H) 39(H) 37(H)  Calcium 8.9 - 10.3 mg/dL 10.6(H) 11.0(H) 11.6(H)   CXR 07/03/2019:  IMPRESSION: Diffuse bilateral airspace opacities which may reflect pulmonary alveolar edema versus multifocal pneumonia.  CXR 07/04/2019:  IMPRESSION: Bilateral airspace opacities as on the prior exam. This could be due to multifocal pneumonia, ARDS, and/or pulmonary edema.  CT CHEST WO CONTRAST 07/04/2019: IMPRESSION: 1. Widespread areas of ground-glass throughout both lungs, with associated diffuse airways thickening and scattered secretions, such findings could reflect ARDS, infection including atypical etiologies or severe widespread interstitial and alveolar edema. 2. Small bilateral effusions, right slightly greater than left. 3. Cardiomegaly with lipomatous hypertrophy of the intra-atrial septum and biatrial enlargement. 4. Enlargement of the main pulmonary artery, suggestive of pulmonary arterial hypertension. 5. Mild thickening of the left adrenal gland with surrounding hazy stranding, new from prior study. Finding may reflect adrenal congestion or adjacent process incompletely imaged on this study.  CXR 07/10/2019:  IMPRESSION: Stable bilateral airspace process which may be due to edema/ARDS or infection. Suggestion of small stable effusions/basilar atelectasis.  Discharge Instructions: Discharge Instructions     Call MD for:  difficulty breathing, headache or visual disturbances   Complete by: As directed    Call MD for:  extreme fatigue   Complete by: As directed    Call MD for:  temperature >100.4   Complete by: As directed    Diet - low sodium heart healthy   Complete by: As directed    Increase activity slowly   Complete by: As directed        Signed: Harvie Heck, MD  Internal Medicine, PGY-1  07/18/2019, 4:31 PM   Pager: [phone]

## 2019-07-18 NOTE — Progress Notes (Signed)
Patient daughter at bedside.Patient discharged with PTAR .

## 2019-07-18 NOTE — Progress Notes (Signed)
Occupational Therapy Treatment Patient Details Name: [name] MRN: [id number] DOB: Oct 16, 1934 Today's Date: 07/18/2019    History of present illness 84yo F with extensive past medical history presented SOB with increased oxygen demands, requiring initiation of BIPAP therapy. Patient has been unable to wean from NIV therapy over 2 days.  PCCM consulted for further management.    OT comments  Patient seated in recliner upon entry and agreeable to OT.  Noted increased lethargy today, improved with participation in ADLs.  Patient assisted to sink via recliner and engaged in grooming tasks with min assist seated.  Pt declined standing today due to fatigue/lethargy, although was able to reposition in chair with supervision.  On 5L HFNC upon entry with SpO2 noted at 86%; cueing for PLB and increased to 8L for SpO2 to reach 94% then stabilized on 4L at 90-92% during grooming at sink; once relaxed back in recliner SpO2 dropped to 72% and required 8L HFNC to reach back to 90%; upon exiting room pt on 5L at 90% SpO2.  Pt continues to fatigue easily, limited by poor activity tolerance and generalized weakness.  DC plan remains appropriate. Will follow.    Follow Up Recommendations  SNF    Equipment Recommendations  Other (comment)(TBD at next venue of care)    Recommendations for Other Services      Precautions / Restrictions Precautions Precautions: Fall Restrictions Weight Bearing Restrictions: No       Mobility Bed Mobility               General bed mobility comments: OOB in chair upon arrival  Transfers                 General transfer comment: pt declined due to fatigue/lethargy    Balance Overall balance assessment: Needs assistance Sitting-balance support: No upper extremity supported;Feet supported Sitting balance-Leahy Scale: Fair                                     ADL either performed or assessed with clinical judgement   ADL Overall  ADL's : Needs assistance/impaired     Grooming: Minimal assistance;Oral care;Brushing hair;Sitting Grooming Details (indicate cue type and reason): increased time and effort, able to complete oral care with min assist and brushing hair with supervision seated at sink; encouragement to complete without assist and frequent rest breaks required                               General ADL Comments: pt with decreased activity tolerance and endurance; SpO2 monitored throughout session     Vision   Vision Assessment?: No apparent visual deficits   Perception     Praxis      Cognition Arousal/Alertness: Lethargic;Awake/alert Behavior During Therapy: WFL for tasks assessed/performed Overall Cognitive Status: Within Functional Limits for tasks assessed                                 General Comments: pt lethargic initally, improved with activity; slow processing and decreased problem sovling related to lethargy        Exercises     Shoulder Instructions       General Comments patient on 5L HFNC upon entry with SpO2 noted at 86%; cueing for PLB and increased to 8L for SpO2  to reach 94% then stabilized on 4L at 90-92% during grooming at sink; once relaxed back in recliner SpO2 dropped to 72% and required 8L HFNC to reach back to 90%; upon exiting room pt on 5L at 90% SpO2    Pertinent Vitals/ Pain       Pain Assessment: No/denies pain  Home Living                                          Prior Functioning/Environment              Frequency  Min 2X/week        Progress Toward Goals  OT Goals(current goals can now be found in the care plan section)  Progress towards OT goals: Progressing toward goals  Acute Rehab OT Goals Patient Stated Goal: to go back to A living OT Goal Formulation: With patient  Plan Discharge plan remains appropriate;Frequency remains appropriate    Co-evaluation                 AM-PAC OT "6  Clicks" Daily Activity     Outcome Measure   Help from another person eating meals?: A Little Help from another person taking care of personal grooming?: A Little Help from another person toileting, which includes using toliet, bedpan, or urinal?: A Lot Help from another person bathing (including washing, rinsing, drying)?: A Lot Help from another person to put on and taking off regular upper body clothing?: A Little Help from another person to put on and taking off regular lower body clothing?: A Lot 6 Click Score: 15    End of Session Equipment Utilized During Treatment: Oxygen  OT Visit Diagnosis: Other abnormalities of gait and mobility (R26.89);Muscle weakness (generalized) (M62.81)   Activity Tolerance Patient limited by lethargy   Patient Left in chair;with call bell/phone within reach;with family/visitor present   Nurse Communication Mobility status        Time: 1415-1445 OT Time Calculation (min): 30 min  Charges: OT General Charges $OT Visit: 1 Visit OT Treatments $Self Care/Home Management : 23-37 mins  Delight Stare, [place] Pager [phone] Office [phone]    Delight Stare 07/18/2019, 3:43 PM

## 2019-07-18 NOTE — TOC Progression Note (Addendum)
Transition of Care [facility]) - Progression Note    Patient Details  Name: [name] MRN: [id number] Date of Birth: 31-Aug-1935  Transition of Care [facility]) CM/SW [place], [place] Phone Number: [phone] 07/18/2019, 10:23 AM  Clinical Narrative:     CSW spoke to [facility] they report they are prepared to accept patient today once COVID test results come back, CSW paged MD for DC summary and orders if patient is medically stable to dc today.   Expected Discharge Plan: Assisted Living Barriers to Discharge: Continued Medical Work up  Expected Discharge Plan and Services Expected Discharge Plan: Assisted Living       Living arrangements for the past 2 months: Assisted Living Facility([facility])                                       Social Determinants of Health (SDOH) Interventions    Readmission Risk Interventions No flowsheet data found.

## 2019-07-18 NOTE — Progress Notes (Signed)
Followed up [facility]   request for notary on AD.  After speaking with [name] and her daughter [name] for a while was advised [name] AD had been found among her papers at home.  The AD was turned in to the nurse and has been noted on [name] file.  Offered prayer aloud. Then we sang a chorus of "Amen" in celebration of [name] being discharged today or tomorrow.  De Burrs Chaplain Resident

## 2019-07-18 NOTE — Progress Notes (Signed)
  Speech Language Pathology Treatment: Dysphagia  Patient Details Name: [name] MRN: [id number] DOB: 1934/10/29 Today's Date: 07/18/2019 Time: 1125-1140 SLP Time Calculation (min) (ACUTE ONLY): 15 min  Assessment / Plan / Recommendation Clinical Impression  Pt was encountered awake/alert sitting upright in chair with daughter present at bedside.  Pt was consuming Ensure via straw sip upon SLP arrival.  She exhibited eructation following 2/5 trials of Ensure following by a delayed throat clear.  Pt reported that she takes medication to manage reflux at baseline.  Pt was cued to take small, single sips of thin liquid via straw sip (Ensure & water) and no clinical s/sx of aspiration were observed.  Pt was additionally observed with minimal trials of regular solids and she exhibited prolonged but effective mastication.  No oral residue was observed and pt's daughter reported that the pt had been tolerating her diet without difficulty.  Pt's oxygen was around 85% with HFNC upon arrival and it fluctuated between 80%-85% throughout this tx session.  Desaturation did not consistently occur with po intake; therefore unable to determine if it was exclusively related to po intake.  Pt was encouraged to breathe through her nose, which elevated oxygen saturation.  Educated pt and daughter regarding recommendations to take small bites/sips, have a slow rate of intake, and to take a break from po intake if SOB.  Pt verbalized education via teach back.  Recommend continuation of regular solids and thin liquid with full supervision to cue for compensatory strategies.  SLP will continue to f/u for dysphagia treatment per POC.    HPI HPI: Ms. Madine Sarr is a 84 y/o female with PMHx of HTN, CKD IV, CHF, OSA and A.fib presenting with sudden onset dyspnea in setting of increased O2 requirement for 1 week after cardioversion. CXR concerning for pulmonary edema vs bilateral pneumonia. Initial ABG with respiratory  acidosis. MD concerned for alveolar hemorrhage. No infectious symptoms.       SLP Plan  Continue with current plan of care       Recommendations  Diet recommendations: Regular;Thin liquid Liquids provided via: Cup;Straw Medication Administration: Whole meds with puree Supervision: Staff to assist with self feeding;Patient able to self feed;Full supervision/cueing for compensatory strategies Compensations: Slow rate;Small sips/bites;Minimize environmental distractions Postural Changes and/or Swallow Maneuvers: Seated upright 90 degrees;Upright 30-60 min after meal                Oral Care Recommendations: Oral care BID;Staff/trained caregiver to provide oral care Follow up Recommendations: Skilled Nursing facility SLP Visit Diagnosis: Dysphagia, oropharyngeal phase (R13.12) Plan: Continue with current plan of care       Colin Mulders M.S., [place] Office: [phone]                [place] 07/18/2019, 11:46 AM

## 2019-07-19 ENCOUNTER — Other Ambulatory Visit: Payer: Self-pay

## 2019-07-19 ENCOUNTER — Emergency Department (HOSPITAL_COMMUNITY): Payer: Medicare Other

## 2019-07-19 ENCOUNTER — Encounter (HOSPITAL_COMMUNITY): Payer: Self-pay | Admitting: Emergency Medicine

## 2019-07-19 ENCOUNTER — Inpatient Hospital Stay (HOSPITAL_COMMUNITY)
Admission: EM | Admit: 2019-07-19 | Discharge: 2019-08-07 | DRG: 189 | Disposition: A | Discharge status: Skilled Nursing Facility | Payer: Medicare Other | Attending: Internal Medicine | Admitting: Internal Medicine

## 2019-07-19 ENCOUNTER — Telehealth: Payer: Self-pay | Admitting: *Deleted

## 2019-07-19 DIAGNOSIS — Z6841 Body Mass Index (BMI) 40.0 and over, adult: Secondary | ICD-10-CM

## 2019-07-19 DIAGNOSIS — M549 Dorsalgia, unspecified: Secondary | ICD-10-CM | POA: Diagnosis present

## 2019-07-19 DIAGNOSIS — I5032 Chronic diastolic (congestive) heart failure: Secondary | ICD-10-CM | POA: Diagnosis not present

## 2019-07-19 DIAGNOSIS — E890 Postprocedural hypothyroidism: Secondary | ICD-10-CM | POA: Diagnosis not present

## 2019-07-19 DIAGNOSIS — R042 Hemoptysis: Secondary | ICD-10-CM | POA: Diagnosis present

## 2019-07-19 DIAGNOSIS — F330 Major depressive disorder, recurrent, mild: Secondary | ICD-10-CM | POA: Diagnosis not present

## 2019-07-19 DIAGNOSIS — I248 Other forms of acute ischemic heart disease: Secondary | ICD-10-CM | POA: Diagnosis present

## 2019-07-19 DIAGNOSIS — I129 Hypertensive chronic kidney disease with stage 1 through stage 4 chronic kidney disease, or unspecified chronic kidney disease: Secondary | ICD-10-CM | POA: Diagnosis not present

## 2019-07-19 DIAGNOSIS — I272 Pulmonary hypertension, unspecified: Secondary | ICD-10-CM | POA: Diagnosis present

## 2019-07-19 DIAGNOSIS — R0902 Hypoxemia: Secondary | ICD-10-CM | POA: Diagnosis not present

## 2019-07-19 DIAGNOSIS — Z7952 Long term (current) use of systemic steroids: Secondary | ICD-10-CM | POA: Diagnosis not present

## 2019-07-19 DIAGNOSIS — I5042 Chronic combined systolic (congestive) and diastolic (congestive) heart failure: Secondary | ICD-10-CM | POA: Diagnosis present

## 2019-07-19 DIAGNOSIS — Z86711 Personal history of pulmonary embolism: Secondary | ICD-10-CM

## 2019-07-19 DIAGNOSIS — E1165 Type 2 diabetes mellitus with hyperglycemia: Secondary | ICD-10-CM | POA: Diagnosis present

## 2019-07-19 DIAGNOSIS — R5381 Other malaise: Secondary | ICD-10-CM | POA: Diagnosis present

## 2019-07-19 DIAGNOSIS — Z888 Allergy status to other drugs, medicaments and biological substances status: Secondary | ICD-10-CM

## 2019-07-19 DIAGNOSIS — J9601 Acute respiratory failure with hypoxia: Secondary | ICD-10-CM | POA: Diagnosis present

## 2019-07-19 DIAGNOSIS — N189 Chronic kidney disease, unspecified: Secondary | ICD-10-CM

## 2019-07-19 DIAGNOSIS — Z792 Long term (current) use of antibiotics: Secondary | ICD-10-CM

## 2019-07-19 DIAGNOSIS — J45909 Unspecified asthma, uncomplicated: Secondary | ICD-10-CM | POA: Diagnosis present

## 2019-07-19 DIAGNOSIS — J9622 Acute and chronic respiratory failure with hypercapnia: Secondary | ICD-10-CM | POA: Diagnosis present

## 2019-07-19 DIAGNOSIS — N184 Chronic kidney disease, stage 4 (severe): Secondary | ICD-10-CM | POA: Diagnosis present

## 2019-07-19 DIAGNOSIS — I484 Atypical atrial flutter: Secondary | ICD-10-CM | POA: Diagnosis present

## 2019-07-19 DIAGNOSIS — D869 Sarcoidosis, unspecified: Secondary | ICD-10-CM | POA: Diagnosis present

## 2019-07-19 DIAGNOSIS — D631 Anemia in chronic kidney disease: Secondary | ICD-10-CM | POA: Diagnosis present

## 2019-07-19 DIAGNOSIS — D649 Anemia, unspecified: Secondary | ICD-10-CM

## 2019-07-19 DIAGNOSIS — E870 Hyperosmolality and hypernatremia: Secondary | ICD-10-CM | POA: Diagnosis present

## 2019-07-19 DIAGNOSIS — I482 Chronic atrial fibrillation, unspecified: Secondary | ICD-10-CM | POA: Diagnosis present

## 2019-07-19 DIAGNOSIS — R0600 Dyspnea, unspecified: Secondary | ICD-10-CM | POA: Diagnosis not present

## 2019-07-19 DIAGNOSIS — J189 Pneumonia, unspecified organism: Secondary | ICD-10-CM | POA: Diagnosis not present

## 2019-07-19 DIAGNOSIS — T502X5A Adverse effect of carbonic-anhydrase inhibitors, benzothiadiazides and other diuretics, initial encounter: Secondary | ICD-10-CM | POA: Diagnosis not present

## 2019-07-19 DIAGNOSIS — T380X5A Adverse effect of glucocorticoids and synthetic analogues, initial encounter: Secondary | ICD-10-CM | POA: Diagnosis present

## 2019-07-19 DIAGNOSIS — E1122 Type 2 diabetes mellitus with diabetic chronic kidney disease: Secondary | ICD-10-CM | POA: Diagnosis present

## 2019-07-19 DIAGNOSIS — J9621 Acute and chronic respiratory failure with hypoxia: Secondary | ICD-10-CM | POA: Diagnosis not present

## 2019-07-19 DIAGNOSIS — Z7189 Other specified counseling: Secondary | ICD-10-CM | POA: Diagnosis not present

## 2019-07-19 DIAGNOSIS — E7800 Pure hypercholesterolemia, unspecified: Secondary | ICD-10-CM | POA: Diagnosis present

## 2019-07-19 DIAGNOSIS — M419 Scoliosis, unspecified: Secondary | ICD-10-CM | POA: Diagnosis present

## 2019-07-19 DIAGNOSIS — G4730 Sleep apnea, unspecified: Secondary | ICD-10-CM | POA: Diagnosis present

## 2019-07-19 DIAGNOSIS — D693 Immune thrombocytopenic purpura: Secondary | ICD-10-CM | POA: Diagnosis present

## 2019-07-19 DIAGNOSIS — J9600 Acute respiratory failure, unspecified whether with hypoxia or hypercapnia: Secondary | ICD-10-CM | POA: Diagnosis not present

## 2019-07-19 DIAGNOSIS — Y95 Nosocomial condition: Secondary | ICD-10-CM | POA: Diagnosis present

## 2019-07-19 DIAGNOSIS — K625 Hemorrhage of anus and rectum: Secondary | ICD-10-CM | POA: Diagnosis not present

## 2019-07-19 DIAGNOSIS — R000 Tachycardia, unspecified: Secondary | ICD-10-CM | POA: Diagnosis not present

## 2019-07-19 DIAGNOSIS — R32 Unspecified urinary incontinence: Secondary | ICD-10-CM | POA: Diagnosis present

## 2019-07-19 DIAGNOSIS — Z832 Family history of diseases of the blood and blood-forming organs and certain disorders involving the immune mechanism: Secondary | ICD-10-CM

## 2019-07-19 DIAGNOSIS — L89312 Pressure ulcer of right buttock, stage 2: Secondary | ICD-10-CM | POA: Diagnosis present

## 2019-07-19 DIAGNOSIS — Z825 Family history of asthma and other chronic lower respiratory diseases: Secondary | ICD-10-CM

## 2019-07-19 DIAGNOSIS — Z794 Long term (current) use of insulin: Secondary | ICD-10-CM

## 2019-07-19 DIAGNOSIS — Z66 Do not resuscitate: Secondary | ICD-10-CM | POA: Diagnosis present

## 2019-07-19 DIAGNOSIS — Z515 Encounter for palliative care: Secondary | ICD-10-CM | POA: Diagnosis not present

## 2019-07-19 DIAGNOSIS — Z20828 Contact with and (suspected) exposure to other viral communicable diseases: Secondary | ICD-10-CM | POA: Diagnosis present

## 2019-07-19 DIAGNOSIS — J9690 Respiratory failure, unspecified, unspecified whether with hypoxia or hypercapnia: Secondary | ICD-10-CM | POA: Diagnosis present

## 2019-07-19 DIAGNOSIS — R0689 Other abnormalities of breathing: Secondary | ICD-10-CM | POA: Diagnosis not present

## 2019-07-19 DIAGNOSIS — R159 Full incontinence of feces: Secondary | ICD-10-CM | POA: Diagnosis present

## 2019-07-19 DIAGNOSIS — E871 Hypo-osmolality and hyponatremia: Secondary | ICD-10-CM | POA: Diagnosis present

## 2019-07-19 DIAGNOSIS — E785 Hyperlipidemia, unspecified: Secondary | ICD-10-CM | POA: Diagnosis present

## 2019-07-19 DIAGNOSIS — R7989 Other specified abnormal findings of blood chemistry: Secondary | ICD-10-CM

## 2019-07-19 DIAGNOSIS — Z9989 Dependence on other enabling machines and devices: Secondary | ICD-10-CM | POA: Diagnosis not present

## 2019-07-19 DIAGNOSIS — I10 Essential (primary) hypertension: Secondary | ICD-10-CM

## 2019-07-19 DIAGNOSIS — F411 Generalized anxiety disorder: Secondary | ICD-10-CM | POA: Diagnosis present

## 2019-07-19 DIAGNOSIS — I480 Paroxysmal atrial fibrillation: Secondary | ICD-10-CM | POA: Diagnosis present

## 2019-07-19 DIAGNOSIS — R0602 Shortness of breath: Secondary | ICD-10-CM

## 2019-07-19 DIAGNOSIS — Z79890 Hormone replacement therapy (postmenopausal): Secondary | ICD-10-CM

## 2019-07-19 DIAGNOSIS — Z8261 Family history of arthritis: Secondary | ICD-10-CM

## 2019-07-19 DIAGNOSIS — Z79899 Other long term (current) drug therapy: Secondary | ICD-10-CM

## 2019-07-19 DIAGNOSIS — I130 Hypertensive heart and chronic kidney disease with heart failure and stage 1 through stage 4 chronic kidney disease, or unspecified chronic kidney disease: Secondary | ICD-10-CM | POA: Diagnosis present

## 2019-07-19 DIAGNOSIS — Z87891 Personal history of nicotine dependence: Secondary | ICD-10-CM

## 2019-07-19 DIAGNOSIS — E860 Dehydration: Secondary | ICD-10-CM | POA: Diagnosis present

## 2019-07-19 DIAGNOSIS — J690 Pneumonitis due to inhalation of food and vomit: Secondary | ICD-10-CM | POA: Diagnosis not present

## 2019-07-19 DIAGNOSIS — E669 Obesity, unspecified: Secondary | ICD-10-CM | POA: Diagnosis present

## 2019-07-19 DIAGNOSIS — I872 Venous insufficiency (chronic) (peripheral): Secondary | ICD-10-CM | POA: Diagnosis not present

## 2019-07-19 DIAGNOSIS — R609 Edema, unspecified: Secondary | ICD-10-CM | POA: Diagnosis not present

## 2019-07-19 DIAGNOSIS — Z882 Allergy status to sulfonamides status: Secondary | ICD-10-CM

## 2019-07-19 DIAGNOSIS — F329 Major depressive disorder, single episode, unspecified: Secondary | ICD-10-CM | POA: Diagnosis present

## 2019-07-19 DIAGNOSIS — Z7901 Long term (current) use of anticoagulants: Secondary | ICD-10-CM

## 2019-07-19 DIAGNOSIS — N179 Acute kidney failure, unspecified: Secondary | ICD-10-CM | POA: Diagnosis present

## 2019-07-19 DIAGNOSIS — J9691 Respiratory failure, unspecified with hypoxia: Secondary | ICD-10-CM | POA: Diagnosis not present

## 2019-07-19 DIAGNOSIS — E039 Hypothyroidism, unspecified: Secondary | ICD-10-CM | POA: Diagnosis present

## 2019-07-19 DIAGNOSIS — J90 Pleural effusion, not elsewhere classified: Secondary | ICD-10-CM | POA: Diagnosis not present

## 2019-07-19 DIAGNOSIS — R262 Difficulty in walking, not elsewhere classified: Secondary | ICD-10-CM | POA: Diagnosis not present

## 2019-07-19 DIAGNOSIS — M6281 Muscle weakness (generalized): Secondary | ICD-10-CM | POA: Diagnosis not present

## 2019-07-19 DIAGNOSIS — L304 Erythema intertrigo: Secondary | ICD-10-CM | POA: Diagnosis present

## 2019-07-19 DIAGNOSIS — G4733 Obstructive sleep apnea (adult) (pediatric): Secondary | ICD-10-CM | POA: Diagnosis present

## 2019-07-19 DIAGNOSIS — R739 Hyperglycemia, unspecified: Secondary | ICD-10-CM | POA: Diagnosis not present

## 2019-07-19 DIAGNOSIS — F410 Panic disorder [episodic paroxysmal anxiety] without agoraphobia: Secondary | ICD-10-CM | POA: Diagnosis present

## 2019-07-19 LAB — BLOOD GAS, ARTERIAL
Acid-Base Excess: 15.7 mmol/L — ABNORMAL HIGH (ref 0.0–2.0)
Bicarbonate: 41.9 mmol/L — ABNORMAL HIGH (ref 20.0–28.0)
O2 Content: 8 L/min
O2 Saturation: 87.9 %
Patient temperature: 97.5
pCO2 arterial: 60.7 mmHg — ABNORMAL HIGH (ref 32.0–48.0)
pH, Arterial: 7.451 — ABNORMAL HIGH (ref 7.350–7.450)
pO2, Arterial: 53.5 mmHg — ABNORMAL LOW (ref 83.0–108.0)

## 2019-07-19 LAB — CBC WITH DIFFERENTIAL/PLATELET
Abs Immature Granulocytes: 0.51 10*3/uL — ABNORMAL HIGH (ref 0.00–0.07)
Basophils Absolute: 0 10*3/uL (ref 0.0–0.1)
Basophils Relative: 0 %
Eosinophils Absolute: 0 10*3/uL (ref 0.0–0.5)
Eosinophils Relative: 0 %
HCT: 34.3 % — ABNORMAL LOW (ref 36.0–46.0)
Hemoglobin: 10.3 g/dL — ABNORMAL LOW (ref 12.0–15.0)
Immature Granulocytes: 3 %
Lymphocytes Relative: 3 %
Lymphs Abs: 0.5 10*3/uL — ABNORMAL LOW (ref 0.7–4.0)
MCH: 28.8 pg (ref 26.0–34.0)
MCHC: 30 g/dL (ref 30.0–36.0)
MCV: 95.8 fL (ref 80.0–100.0)
Monocytes Absolute: 0.4 10*3/uL (ref 0.1–1.0)
Monocytes Relative: 3 %
Neutro Abs: 14 10*3/uL — ABNORMAL HIGH (ref 1.7–7.7)
Neutrophils Relative %: 91 %
Platelets: 224 10*3/uL (ref 150–400)
RBC: 3.58 MIL/uL — ABNORMAL LOW (ref 3.87–5.11)
RDW: 19.8 % — ABNORMAL HIGH (ref 11.5–15.5)
WBC: 15.4 10*3/uL — ABNORMAL HIGH (ref 4.0–10.5)
nRBC: 0.1 % (ref 0.0–0.2)

## 2019-07-19 LAB — TROPONIN I (HIGH SENSITIVITY): Troponin I (High Sensitivity): 37 ng/L — ABNORMAL HIGH (ref <18)

## 2019-07-19 LAB — BASIC METABOLIC PANEL
Anion gap: 9 (ref 5–15)
BUN: 103 mg/dL — ABNORMAL HIGH (ref 8–23)
CO2: 40 mmol/L — ABNORMAL HIGH (ref 22–32)
Calcium: 10.6 mg/dL — ABNORMAL HIGH (ref 8.9–10.3)
Chloride: 91 mmol/L — ABNORMAL LOW (ref 98–111)
Creatinine, Ser: 2.16 mg/dL — ABNORMAL HIGH (ref 0.44–1.00)
GFR calc Af Amer: 24 mL/min — ABNORMAL LOW (ref >60)
GFR calc non Af Amer: 21 mL/min — ABNORMAL LOW (ref >60)
Glucose, Bld: 185 mg/dL — ABNORMAL HIGH (ref 70–99)
Potassium: 4.8 mmol/L (ref 3.5–5.1)
Sodium: 140 mmol/L (ref 135–145)

## 2019-07-19 LAB — BRAIN NATRIURETIC PEPTIDE: B Natriuretic Peptide: 282.1 pg/mL — ABNORMAL HIGH (ref 0.0–100.0)

## 2019-07-19 LAB — URINALYSIS, ROUTINE W REFLEX MICROSCOPIC
Bilirubin Urine: NEGATIVE
Glucose, UA: NEGATIVE mg/dL
Hgb urine dipstick: NEGATIVE
Ketones, ur: NEGATIVE mg/dL
Nitrite: NEGATIVE
Protein, ur: NEGATIVE mg/dL
Specific Gravity, Urine: 1.01 (ref 1.005–1.030)
pH: 7 (ref 5.0–8.0)

## 2019-07-19 LAB — TYPE AND SCREEN
ABO/RH(D): A NEG
Antibody Screen: NEGATIVE

## 2019-07-19 LAB — PROCALCITONIN: Procalcitonin: <0.1 ng/mL

## 2019-07-19 LAB — GLUCOSE, CAPILLARY: Glucose-Capillary: 275 mg/dL — ABNORMAL HIGH (ref 70–99)

## 2019-07-19 MED ORDER — INSULIN GLARGINE 100 UNIT/ML ~~LOC~~ SOLN
40.0000 [IU] | Freq: Every day | SUBCUTANEOUS | Status: DC
  Administered 2019-07-19 – 2019-07-25 (×7): 40 [IU] via SUBCUTANEOUS
  Filled 2019-07-19 (×8): qty 0.4

## 2019-07-19 MED ORDER — SODIUM CHLORIDE 0.9% FLUSH
3.0000 mL | Freq: Two times a day (BID) | INTRAVENOUS | Status: DC
  Administered 2019-07-19 – 2019-08-07 (×37): 3 mL via INTRAVENOUS

## 2019-07-19 MED ORDER — SERTRALINE HCL 100 MG PO TABS
100.0000 mg | ORAL_TABLET | Freq: Every evening | ORAL | Status: DC
  Administered 2019-07-19 – 2019-08-06 (×19): 100 mg via ORAL
  Filled 2019-07-19 (×19): qty 1

## 2019-07-19 MED ORDER — FUROSEMIDE 20 MG PO TABS
20.0000 mg | ORAL_TABLET | ORAL | Status: DC
  Administered 2019-07-20: 20 mg via ORAL
  Filled 2019-07-19: qty 1

## 2019-07-19 MED ORDER — SODIUM CHLORIDE 0.9 % IV SOLN
500.0000 mg | Freq: Every day | INTRAVENOUS | Status: AC
  Administered 2019-07-20 – 2019-07-23 (×5): 500 mg via INTRAVENOUS
  Filled 2019-07-19 (×5): qty 500

## 2019-07-19 MED ORDER — ONDANSETRON HCL 4 MG PO TABS: 4.0000 mg | ORAL_TABLET | Freq: Four times a day (QID) | ORAL | Status: DC | PRN

## 2019-07-19 MED ORDER — ACETAMINOPHEN 650 MG RE SUPP
650.0000 mg | Freq: Four times a day (QID) | RECTAL | Status: DC | PRN
  Filled 2019-07-19: qty 1

## 2019-07-19 MED ORDER — SODIUM CHLORIDE 0.9 % IV SOLN
2.0000 g | INTRAVENOUS | Status: DC
  Administered 2019-07-19 – 2019-07-22 (×4): 2 g via INTRAVENOUS
  Filled 2019-07-19 (×4): qty 2

## 2019-07-19 MED ORDER — CHLORHEXIDINE GLUCONATE 0.12 % MT SOLN
15.0000 mL | Freq: Two times a day (BID) | OROMUCOSAL | Status: DC
  Administered 2019-07-19 – 2019-08-07 (×38): 15 mL via OROMUCOSAL
  Filled 2019-07-19 (×33): qty 15

## 2019-07-19 MED ORDER — VANCOMYCIN HCL 10 G IV SOLR
2000.0000 mg | Freq: Once | INTRAVENOUS | Status: AC
  Administered 2019-07-19: 2000 mg via INTRAVENOUS
  Filled 2019-07-19: qty 2000

## 2019-07-19 MED ORDER — SODIUM CHLORIDE 0.9 % IV SOLN: INTRAVENOUS | Status: DC

## 2019-07-19 MED ORDER — ALBUTEROL SULFATE (2.5 MG/3ML) 0.083% IN NEBU: 2.5000 mg | INHALATION_SOLUTION | RESPIRATORY_TRACT | Status: DC | PRN

## 2019-07-19 MED ORDER — ORAL CARE MOUTH RINSE
15.0000 mL | Freq: Two times a day (BID) | OROMUCOSAL | Status: DC
  Administered 2019-07-20 – 2019-08-07 (×33): 15 mL via OROMUCOSAL

## 2019-07-19 MED ORDER — LEVOTHYROXINE SODIUM 112 MCG PO TABS
112.0000 ug | ORAL_TABLET | Freq: Every day | ORAL | Status: DC
  Administered 2019-07-20 – 2019-08-07 (×18): 112 ug via ORAL
  Filled 2019-07-19 (×21): qty 1

## 2019-07-19 MED ORDER — ATORVASTATIN CALCIUM 40 MG PO TABS
40.0000 mg | ORAL_TABLET | Freq: Every day | ORAL | Status: DC
  Administered 2019-07-19 – 2019-08-06 (×19): 40 mg via ORAL
  Filled 2019-07-19 (×19): qty 1

## 2019-07-19 MED ORDER — PANTOPRAZOLE SODIUM 40 MG PO TBEC
40.0000 mg | DELAYED_RELEASE_TABLET | Freq: Every day | ORAL | Status: DC
  Administered 2019-07-19 – 2019-07-20 (×2): 40 mg via ORAL
  Filled 2019-07-19 (×2): qty 1

## 2019-07-19 MED ORDER — CHLORHEXIDINE GLUCONATE CLOTH 2 % EX PADS
6.0000 | MEDICATED_PAD | Freq: Every day | CUTANEOUS | Status: DC
  Administered 2019-07-19 – 2019-08-07 (×19): 6 via TOPICAL

## 2019-07-19 MED ORDER — INSULIN ASPART 100 UNIT/ML ~~LOC~~ SOLN
0.0000 [IU] | SUBCUTANEOUS | Status: DC
  Administered 2019-07-19: 5 [IU] via SUBCUTANEOUS
  Administered 2019-07-20: 3 [IU] via SUBCUTANEOUS
  Administered 2019-07-20: 12:00:00 5 [IU] via SUBCUTANEOUS
  Administered 2019-07-20: 3 [IU] via SUBCUTANEOUS
  Administered 2019-07-20: 5 [IU] via SUBCUTANEOUS
  Administered 2019-07-20: 9 [IU] via SUBCUTANEOUS
  Administered 2019-07-21: 7 [IU] via SUBCUTANEOUS
  Administered 2019-07-21: 3 [IU] via SUBCUTANEOUS
  Administered 2019-07-21 (×4): 5 [IU] via SUBCUTANEOUS
  Administered 2019-07-21 – 2019-07-22 (×2): 7 [IU] via SUBCUTANEOUS
  Administered 2019-07-22: 5 [IU] via SUBCUTANEOUS
  Administered 2019-07-22: 3 [IU] via SUBCUTANEOUS

## 2019-07-19 MED ORDER — SODIUM CHLORIDE 0.9 % IV SOLN
250.0000 mL | INTRAVENOUS | Status: DC | PRN
  Administered 2019-08-06: 250 mL via INTRAVENOUS

## 2019-07-19 MED ORDER — ONDANSETRON HCL 4 MG/2ML IJ SOLN: 4.0000 mg | Freq: Four times a day (QID) | INTRAMUSCULAR | Status: DC | PRN

## 2019-07-19 MED ORDER — SODIUM CHLORIDE 0.9% FLUSH
3.0000 mL | INTRAVENOUS | Status: DC | PRN
  Administered 2019-08-03 (×2): 3 mL via INTRAVENOUS
  Filled 2019-07-19 (×2): qty 3

## 2019-07-19 MED ORDER — ACETAMINOPHEN 325 MG PO TABS
650.0000 mg | ORAL_TABLET | Freq: Four times a day (QID) | ORAL | Status: DC | PRN
  Administered 2019-07-24 – 2019-08-02 (×2): 650 mg via ORAL
  Filled 2019-07-19 (×2): qty 2

## 2019-07-19 MED ORDER — HYDROCODONE-ACETAMINOPHEN 5-325 MG PO TABS
1.0000 | ORAL_TABLET | ORAL | Status: DC | PRN
  Administered 2019-07-19 – 2019-07-26 (×3): 1 via ORAL
  Administered 2019-07-27 – 2019-07-28 (×5): 2 via ORAL
  Administered 2019-07-29: 1 via ORAL
  Administered 2019-07-29 – 2019-07-30 (×2): 2 via ORAL
  Administered 2019-07-30 (×2): 1 via ORAL
  Administered 2019-07-31 – 2019-08-03 (×5): 2 via ORAL
  Filled 2019-07-19: qty 1
  Filled 2019-07-19: qty 2
  Filled 2019-07-19: qty 1
  Filled 2019-07-19: qty 2
  Filled 2019-07-19: qty 1
  Filled 2019-07-19 (×3): qty 2
  Filled 2019-07-19: qty 1
  Filled 2019-07-19: qty 2
  Filled 2019-07-19: qty 1
  Filled 2019-07-19 (×4): qty 2
  Filled 2019-07-19: qty 1
  Filled 2019-07-19 (×2): qty 2

## 2019-07-19 MED ORDER — BISACODYL 5 MG PO TBEC: 10.0000 mg | DELAYED_RELEASE_TABLET | ORAL | Status: DC | PRN

## 2019-07-19 MED ORDER — VANCOMYCIN VARIABLE DOSE PER UNSTABLE RENAL FUNCTION (PHARMACIST DOSING): Status: DC

## 2019-07-19 MED ORDER — FUROSEMIDE 40 MG PO TABS: 40.0000 mg | ORAL_TABLET | ORAL | Status: DC

## 2019-07-19 MED ORDER — GLUCERNA SHAKE PO LIQD
237.0000 mL | Freq: Three times a day (TID) | ORAL | Status: DC
  Administered 2019-07-19 – 2019-07-26 (×10): 237 mL via ORAL
  Filled 2019-07-19 (×21): qty 237

## 2019-07-19 MED ORDER — FENOFIBRATE 54 MG PO TABS
54.0000 mg | ORAL_TABLET | Freq: Every day | ORAL | Status: DC
  Administered 2019-07-19 – 2019-08-01 (×13): 54 mg via ORAL
  Filled 2019-07-19 (×14): qty 1

## 2019-07-19 MED ORDER — METHYLPREDNISOLONE SODIUM SUCC 40 MG IJ SOLR
40.0000 mg | Freq: Four times a day (QID) | INTRAMUSCULAR | Status: DC
  Administered 2019-07-20 (×2): 40 mg via INTRAVENOUS
  Filled 2019-07-19 (×2): qty 1

## 2019-07-19 MED ORDER — APIXABAN 2.5 MG PO TABS
2.5000 mg | ORAL_TABLET | Freq: Two times a day (BID) | ORAL | Status: DC
  Administered 2019-07-19 – 2019-07-21 (×4): 2.5 mg via ORAL
  Filled 2019-07-19 (×4): qty 1

## 2019-07-19 MED ORDER — DEXAMETHASONE SODIUM PHOSPHATE 4 MG/ML IJ SOLN
4.0000 mg | Freq: Once | INTRAMUSCULAR | Status: AC
  Administered 2019-07-19: 4 mg via INTRAVENOUS
  Filled 2019-07-19: qty 1

## 2019-07-19 NOTE — H&P (Signed)
[name] WVP:[id number] DOB: [birth year] DOA: 07/19/2019     PCP: Sara Glazier, MD   Outpatient Specialists:   CARDS: * Dr. NEphrology: *  Dr. NEurology *   Dr. Pulmonary *  Dr.  Oncology  Dr. Marin [name] GI* Dr.  Sadie [name], [facility]) Urology Dr. *  Patient arrived to ER on 07/19/19 at 1359  Patient coming from:  From facility [facility]  Chief Complaint:  Chief Complaint  Patient presents with  . Shortness of Breath  . Abnormal Lab    low hgb     HPI: [name] is a 84 y.o. female with medical history significant of chronic ITP, CKD stage3, anemia of chronic disease, A.fib on Eliquis, sleep apnea on CPAP history of CHF HLD history of pulmonary embolism in 2010 diabetes type 2, HTN, hypothyroidism, pressure ulcer    Presented with   worsening shortness of breath SNF was concerned with hemoglobin down to 7.9 Her oxygen requirement went up to 8 L and was satting 80% although at the time of discharge she was comfortable on 4 to 6 L.  She was started on nonrebreather initially and oxygen saturation went up to 97% Even before this she have had mild dyspnea on exertion States prior to admission on October 19 for cardioversion she had a procedure done for her a.fib She needed to be on oxygen after that She continued to be very short of breath Initially she was diagnosed with bronchitis She was able to go back to assisted living But was readmitted on 25th of october  admitted by teaching service for acute hypoxic respiratory failure requiring BiPAP support tested negative for Covid.  Yesterday thought she may have CHF exacerbation given lower extremity swelling diuresis with 40 mg IV Lasix, JVD and pitting edema improved, but patient still requiring Bipap She was treated also for possible underlying pneumonia with vancomycin and cefepime Troponins were elevated into the 200 range thought to be secondary to demand ischemia Should blood pressures was above 200 but that  has improved Regarding her diabetes she was kept on Lantus 40 units and sliding scale    PCCM consulted and patient started on high dose steroids and her oxygen requirement has improved she was weaned down to 4-6 L of O2 and was able to be discharged to SNF yesterday 18 July 2019 On prednisone taper and CPAP with home oxygen  family states she states that it hurts to breath   Infectious risk factors:  Reports shortness of breath,       COVID TEST NEGATIVE   Lab Results  Component Value Date   SARSCOV2NAA NEGATIVE 07/18/2019   [place] NEGATIVE 07/03/2019   [place] NOT DETECTED 06/22/2019     Regarding pertinent Chronic problems:  Chronic ITP been taking care of by Dr. Marin [name] received two doses of N-plate and Aranesp during her hospitalization per Dr. Marin [name]   Hyperlipidemia -   on statins lipitor   HTN on diltiazem and lasix   CHF diastolic  - last echo  54/62/7035   EF  50 to 00%  Grade I diastolic dysfunction (impaired relaxation).   DM 2 -  Lab Results  Component Value Date   HGBA1C 6.3 (A) 02/22/2019   on insulin,     Hypothyroidism:  Lab Results  Component Value Date   TSH 0.838 07/04/2019   on synthroid    Morbid obesity-   BMI Readings from Last 1 Encounters:  07/18/19 45.12 kg/m  OSA -on nocturnal   CPAP      A. Fib -  - CHA2DS2 vas score >3:  current  on anticoagulation with Eliquis,           -  Rate control:  Currently controlled with  Diltiazem      CKD stage III - baseline Cr  2.0 Lab Results  Component Value Date   CREATININE 2.16 (H) 07/19/2019   CREATININE 1.99 (H) 07/18/2019   CREATININE 2.12 (H) 07/17/2019    While in ER:  The following Work up has been ordered so far:  Orders Placed This Encounter  Procedures  . DG Chest 2 View  . CBC with Differential  . BMET  . BNP  . If O2 Sat <94% administer O2 at 2 liters/minute via nasal cannula  . Consult to hospitalist  ALL PATIENTS BEING ADMITTED/HAVING PROCEDURES  NEED COVID-19 SCREENING  . Pulse oximetry, continuous  . EKG 12-Lead  . ED EKG  . ECG  . Type and screen    Following Medications were ordered in ER: Medications  0.9 %  sodium chloride infusion (has no administration in time range)  feeding supplement (GLUCERNA SHAKE) (GLUCERNA SHAKE) liquid 237 mL (has no administration in time range)  dexamethasone (DECADRON) injection 4 mg (4 mg Intravenous Given 07/19/19 1755)        Consult Orders  (From admission, onward)         Start     Ordered   07/19/19 1744  Consult to hospitalist  ALL PATIENTS BEING ADMITTED/HAVING PROCEDURES NEED COVID-19 SCREENING  Once    Comments: ALL PATIENTS BEING ADMITTED/HAVING PROCEDURES NEED COVID-19 SCREENING  Provider:  (Not yet assigned)  Question Answer Comment  Place call to: [facility]   Reason for Consult Admit      07/19/19 1743          ER Provider Called:     pulmonology They Recommend admit to medicine  Will see in AM    Significant initial  Findings: Abnormal Labs Reviewed  CBC WITH DIFFERENTIAL/PLATELET - Abnormal; Notable for the following components:      Result Value   WBC 15.4 (*)    RBC 3.58 (*)    Hemoglobin 10.3 (*)    HCT 34.3 (*)    RDW 19.8 (*)    Neutro Abs 14.0 (*)    Lymphs Abs 0.5 (*)    Abs Immature Granulocytes 0.51 (*)    All other components within normal limits  BASIC METABOLIC PANEL - Abnormal; Notable for the following components:   Chloride 91 (*)    CO2 40 (*)    Glucose, Bld 185 (*)    BUN 103 (*)    Creatinine, Ser 2.16 (*)    Calcium 10.6 (*)    GFR calc non Af Amer 21 (*)    GFR calc Af Amer 24 (*)    All other components within normal limits  BRAIN NATRIURETIC PEPTIDE - Abnormal; Notable for the following components:   B Natriuretic Peptide 282.1 (*)    All other components within normal limits     Otherwise labs showing:    Recent Labs  Lab 07/15/19 0409 07/16/19 0430 07/17/19 0528 07/18/19 0418 07/19/19 1615  NA 144  143 143 141 140  K 5.0 5.1 4.9 5.6* 4.8  CO2 37* 37* 39* 35* 40*  GLUCOSE 316* 207* 117* 165* 185*  BUN 111* 114* 114* 112* 103*  CREATININE 1.98* 1.96* 2.12* 1.99* 2.16*  CALCIUM 11.3*  11.6* 11.0* 10.6* 10.6*    Cr  stable,    Lab Results  Component Value Date   CREATININE 2.16 (H) 07/19/2019   CREATININE 1.99 (H) 07/18/2019   CREATININE 2.12 (H) 07/17/2019    No results for input(s): AST, ALT, ALKPHOS, BILITOT, PROT, ALBUMIN in the last 168 hours. Lab Results  Component Value Date   CALCIUM 10.6 (H) 07/19/2019   PHOS 4.5 08/25/2015      WBC      Component Value Date/Time   WBC 15.4 (H) 07/19/2019 1615   ANC    Component Value Date/Time   NEUTROABS 14.0 (H) 07/19/2019 1615   NEUTROABS 3.2 09/08/2017 1317   ALC No components found for: LYMPHAB    Plt: Lab Results  Component Value Date   PLT 224 07/19/2019    Lactic Acid, Venous    Component Value Date/Time   LATICACIDVEN 1.3 07/03/2019 2020     COVID-19 Labs  No results for input(s): DDIMER, FERRITIN, LDH, CRP in the last 72 hours.  Lab Results  Component Value Date   SARSCOV2NAA NEGATIVE 07/18/2019   SARSCOV2NAA NEGATIVE 07/03/2019   SARSCOV2NAA NOT DETECTED 06/22/2019     Arterial   ABG    Component Value Date/Time   PHART 7.451 (H) 07/19/2019 2015   PCO2ART 60.7 (H) 07/19/2019 2015   PO2ART 53.5 (L) 07/19/2019 2015   HCO3 41.9 (H) 07/19/2019 2015   TCO2 35 (H) 07/04/2019 1536   O2SAT 87.9 07/19/2019 2015    HG/HCT  stable,       Component Value Date/Time   HGB 10.3 (L) 07/19/2019 1615   HGB 9.3 (L) 06/30/2019 1400   HGB 11.0 (L) 09/08/2017 1317   HCT 34.3 (L) 07/19/2019 1615   HCT 33.2 (L) 09/08/2017 1317    Troponin  ordered     ECG: Ordered Personally reviewed by me showing: HR : 63 Rhythm:  NSR,    no evidence of ischemic changes QTC 467   BNP (last 3 results) Recent Labs    07/03/19 1720 07/19/19 1615  BNP 285.2* 282.1*    ProBNP (last 3 results) No results  for input(s): PROBNP in the last 8760 hours.  DM  labs:  HbA1C: Recent Labs    09/08/18 1006 02/22/19 1120  HGBA1C 7.3* 6.3*   CBG (last 3)  Recent Labs    07/18/19 0558 07/18/19 1124 07/18/19 1632  GLUCAP 159* 366* 327*     UA ordered    Ordered    CXR -diffuse bilateral hazy airspace opacities progressed since prior possible early ARDS versus multifocal pneumonia      ED Triage Vitals  Enc Vitals Group     BP 07/19/19 1410 (!) 140/39     Pulse Rate 07/19/19 1410 63     Resp 07/19/19 1410 (!) 26     Temp 07/19/19 1410 (!) 97.5 F (36.4 C)     Temp Source 07/19/19 1410 Oral     SpO2 07/19/19 1410 90 %     Weight --      Height --      Head Circumference --      Peak Flow --      Pain Score 07/19/19 1412 0     Pain Loc --      Pain Edu? --      Excl. in [place]? --   TMAX(24)@       Latest  Blood pressure (!) 164/62, pulse 63, temperature (!) 97.5 F (36.4 C), temperature source  Oral, resp. rate (!) 29, SpO2 90 %.    Hospitalist was called for admission for acute respiratory failure with hypoxia secondary to diffuse pneumonitis   Review of Systems:    Pertinent positives include: shortness of breath at rest. dyspnea on exertion,   Constitutional:  No weight loss, night sweats, Fevers, chills, fatigue, weight loss  HEENT:  No headaches, Difficulty swallowing,Tooth/dental problems,Sore throat,  No sneezing, itching, ear ache, nasal congestion, post nasal drip,  Cardio-vascular:  No chest pain, Orthopnea, PND, anasarca, dizziness, palpitations.no Bilateral lower extremity swelling  GI:  No heartburn, indigestion, abdominal pain, nausea, vomiting, diarrhea, change in bowel habits, loss of appetite, melena, blood in stool, hematemesis Resp:  no  Noo excess mucus, no productive cough, No non-productive cough, No coughing up of blood.No change in color of mucus.No wheezing. Skin:  no rash or lesions. No jaundice GU:  no dysuria, change in color of urine, no  urgency or frequency. No straining to urinate.  No flank pain.  Musculoskeletal:  No joint pain or no joint swelling. No decreased range of motion. No back pain.  Psych:  No change in mood or affect. No depression or anxiety. No memory loss.  Neuro: no localizing neurological complaints, no tingling, no weakness, no double vision, no gait abnormality, no slurred speech, no confusion  All systems reviewed and apart from [place] all are negative  Past Medical History:   Past Medical History:  Diagnosis Date  . Allergic rhinitis   . Anxiety state, unspecified    panic attacks  . CHF (congestive heart failure) ([place])   . Depressive disorder, not elsewhere classified   . Extrinsic asthma, unspecified    no problem since adulthood  . Obesity   . OSA on CPAP    severe  . Pure hypercholesterolemia   . Respiratory failure with hypoxia ([place]) 09/2008   acute, secondary to multiple bilateral pulmonary embolism , negative hypercoagulable workup 09/2008 hospital stay  . Scoliosis   . Type II or unspecified type diabetes mellitus without mention of complication, not stated as uncontrolled   . Unspecified essential hypertension   . Unspecified hypothyroidism    hypo      Past Surgical History:  Procedure Laterality Date  . CARDIOVERSION N/A 06/26/2019   Procedure: CARDIOVERSION;  Surgeon: Jerline Pain, MD;  Location: [facility] ENDOSCOPY;  Service: Cardiovascular;  Laterality: N/A;  . EYE SURGERY    . JOINT REPLACEMENT    . knee replaced    . THYROID SURGERY      Social History:  Ambulatory  Gilford Rile      reports that she quit smoking about 29 years ago. Her smoking use included cigarettes. She has a 5.00 pack-year smoking history. She has never used smokeless tobacco. She reports that she does not drink alcohol or use drugs.   Family History:  Family History  Problem Relation Age of Onset  . Allergies Mother   . Clotting disorder Mother   . Osteoarthritis Mother   . Asthma Mother   .  Arthritis Other   . Diabetes Other   . Hyperlipidemia Other   . Hypertension Other   . Coronary artery disease Other   . Stroke Other   . Osteoarthritis Daughter   . Rheum arthritis Maternal Grandmother   . Clotting disorder Maternal Grandmother   . Clotting disorder Maternal Uncle   . Clotting disorder Daughter   . Allergies Daughter   . Breast cancer Neg Hx     Allergies: Allergies  Allergen Reactions  . Adhesive [Tape] Other (See Comments)    PULLS OFF THE SKIN  . Apixaban Other (See Comments)    Internal Bleeding  . Aspirin Itching, Rash, Hives and Swelling    Swelling of her tongue  . Mirabegron Other (See Comments)    Patient experienced A-Fib  . Pineapple Anaphylaxis and Swelling    Throat swells and blisters on tongue and roof of mouth per patient  . Metformin Diarrhea and Nausea Only  . Tetracycline Hives  . Fluticasone-Salmeterol Itching and Rash  . Iodinated Diagnostic Agents Itching and Rash       . Lactose Intolerance (Gi) Other (See Comments)    Gas   . Latex Itching, Rash and Other (See Comments)    Pulls off the skin and causes welts  . Lisinopril Cough  . Metronidazole Other (See Comments)    Reaction not known  . Sulfa Antibiotics Rash  . Sulfonamide Derivatives Itching and Rash     Prior to Admission medications   Medication Sig Start Date End Date Taking? Authorizing Provider  acetaminophen (TYLENOL) 325 MG tablet Take 650 mg by mouth every 6 (six) hours as needed for moderate pain or fever (for fever or pain/discomfort).    Yes [provider]  amoxicillin (AMOXIL) 500 MG capsule Take 1,000-2,000 mg by mouth See admin instructions. Take 1,000 mg by mouth one hour before dental appointments and 1,000 mg afterwards AS DIRECTED   Yes [provider]  apixaban (ELIQUIS) 2.5 MG TABS tablet Take 1 tablet (2.5 mg total) by mouth 2 (two) times daily. 04/18/19  Yes Jerline Pain, MD  atorvastatin (LIPITOR) 40 MG tablet Take 40 mg by  mouth at bedtime.    Yes [provider]  bisacodyl (DULCOLAX) 5 MG EC tablet Take 10 mg by mouth every 3 (three) days as needed (for constipation).    Yes [provider]  Cholecalciferol (VITAMIN D3) 2000 units TABS Take 2,000 Units by mouth daily.   Yes [provider]  diphenhydrAMINE (BANOPHEN) 25 MG tablet Take 25 mg by mouth as needed ("for adverse food reactions").    Yes [provider]  docusate sodium (COLACE) 100 MG capsule Take 100 mg by mouth daily.  04/30/15  Yes [provider]  fenofibrate 54 MG tablet TAKE 1 TABLET(54 MG) BY MOUTH DAILY Patient taking differently: Take 54 mg by mouth daily.  08/04/17  Yes Nafziger, Tommi Rumps, NP  fexofenadine (ALLEGRA) 180 MG tablet Take 180 mg by mouth at bedtime.    Yes [provider]  Flaxseed, Linseed, (FLAXSEED OIL) 1000 MG CAPS Take 1,000 mg by mouth 2 (two) times daily.    Yes [provider]  furosemide (LASIX) 20 MG tablet Take 20-40 mg by mouth See admin instructions. Take 20 mg by mouth in the morning on Sun/Tues/Thurs/Sat 40mg  on Mon/Wed/Friday   Yes [provider]  HUMALOG 100 UNIT/ML injection Inject 25-35 Units into the skin See admin instructions. "Inject 35 units into the skin before breakfast, 30 units before lunch, and 25 units before supper, per sliding scale: BGL >90 = give the full dose for a regular meal, 60-90 = 17 units before breakfast, 15 units before lunch, and 12 units before supper; hold if <60" 02/10/19  Yes [provider]  levothyroxine (SYNTHROID, LEVOTHROID) 112 MCG tablet TAKE 1 TABLET BY MOUTH DAILY Patient taking differently: Take 112 mcg by mouth daily before breakfast.  11/24/17  Yes Nafziger, Tommi Rumps, NP  Multiple Vitamin (MULTIVITAMIN) tablet  Take 1 tablet by mouth daily.     Yes [provider]  omeprazole (PRILOSEC) 20 MG capsule Take 20 mg by mouth daily.  01/25/19  Yes [provider]  potassium citrate (UROCIT-K) 10  MEQ (1080 MG) SR tablet Take 10 mEq by mouth 2 (two) times daily.   Yes [provider]  predniSONE (DELTASONE) 5 MG tablet Take 7 tablets (35 mg total) by mouth daily with breakfast for 3 days, THEN 6 tablets (30 mg total) daily with breakfast for 5 days, THEN 5 tablets (25 mg total) daily with breakfast for 5 days, THEN 4 tablets (20 mg total) daily with breakfast for 5 days, THEN 3 tablets (15 mg total) daily with breakfast for 5 days, THEN 2 tablets (10 mg total) daily with breakfast for 5 days, THEN 1 tablet (5 mg total) daily with breakfast for 5 days. 07/19/19 08/21/19 Yes Aslam, Loralyn [place], MD  sertraline (ZOLOFT) 100 MG tablet Take 100 mg by mouth every evening.  05/24/18  Yes [provider]  vitamin C (ASCORBIC ACID) 250 MG tablet Take 250 mg by mouth daily.   Yes [provider]  Continuous Blood Gluc Sensor (FREESTYLE LIBRE 14 DAY SENSOR) MISC Inject 1 patch into the skin every 14 (fourteen) days.    [provider]  Dulaglutide (TRULICITY) 1.5 DJ/2.4QA SOPN Inject 1.5 mg into the skin once a week. Patient not taking: Reported on 07/19/2019 09/09/18   Philemon Kingdom, MD  insulin glargine (LANTUS) 100 UNIT/ML injection Inject 0.4 mLs (40 Units total) into the skin daily. Patient not taking: Reported on 07/19/2019 02/22/19   Philemon Kingdom, MD  Insulin Pen Needle (B-D UF III MINI PEN NEEDLES) 31G X 5 MM MISC Use daily for insulin injection 12/15/16   Nafziger, Tommi Rumps, NP  Insulin Syringe-Needle U-100 (INSULIN SYRINGE 1CC/30GX5/16") 30G X 5/16" 1 ML MISC 2 (two) times daily.  01/27/17   [provider]   Physical Exam: Blood pressure (!) 164/62, pulse 63, temperature (!) 97.5 F (36.4 C), temperature source Oral, resp. rate (!) 29, SpO2 90 %. 1. General:  in    Acute distress increased work of breathing   -appearing 2. Psychological: Alert and Oriented 3. Head/ENT:   Moist  Mucous Membranes                          Head Non traumatic, neck supple                            Poor Dentition 4. SKIN:  ecreased Skin turgor,  Skin clean Dry and intact no rash 5. Heart: Regular rate and rhythm no  Murmur, no Rub or gallop 6. Lungs:  no wheezes or crackles   7. Abdomen: Soft,   non-tender, Non distended obese bowel sounds present 8. Lower extremities: no clubbing, cyanosis, no  edema 9. Neurologically Grossly intact, moving all 4 extremities equally   10. MSK: Normal range of motion   All other LABS:     Recent Labs  Lab 07/15/19 0409 07/16/19 0430 07/17/19 0528 07/18/19 0418 07/19/19 1615  WBC 10.3 14.1* 10.3 11.5* 15.4*  NEUTROABS  --   --   --   --  14.0*  HGB 9.1* 10.2* 8.7* 8.5* 10.3*  HCT 30.0* 33.8* 29.3* 27.7* 34.3*  MCV 94.6 95.8 94.8 94.2 95.8  PLT 148* 180 155 143* 224     Recent Labs  Lab 07/15/19  1829 07/16/19 0430 07/17/19 0528 07/18/19 0418 07/19/19 1615  NA 144 143 143 141 140  K 5.0 5.1 4.9 5.6* 4.8  CL 97* 95* 95* 95* 91*  CO2 37* 37* 39* 35* 40*  GLUCOSE 316* 207* 117* 165* 185*  BUN 111* 114* 114* 112* 103*  CREATININE 1.98* 1.96* 2.12* 1.99* 2.16*  CALCIUM 11.3* 11.6* 11.0* 10.6* 10.6*     No results for input(s): AST, ALT, ALKPHOS, BILITOT, PROT, ALBUMIN in the last 168 hours.     Cultures:    Component Value Date/Time   SDES URINE, CATHETERIZED 07/11/2019 1130   SPECREQUEST NONE 07/11/2019 1130   CULT  07/11/2019 1130    NO GROWTH Performed at [facility] [address], [place], [place] [address]    REPTSTATUS 07/12/2019 FINAL 07/11/2019 1130     Radiological Exams on Admission: Dg Chest 2 View  Result Date: 07/19/2019 CLINICAL DATA:  Shortness of breath, hypoxia EXAM: CHEST - 2 VIEW COMPARISON:  07/10/2019 FINDINGS: Cardiomegaly, grossly stable. Calcific aortic knob. There are diffuse bilateral hazy airspace opacities, progressed from prior. Small bilateral pleural effusions. No pneumothorax. IMPRESSION: 1. Diffuse bilateral hazy airspace opacities have progressed since  prior. Findings may represent pulmonary edema/ARDS versus multifocal pneumonia. 2. Small bilateral pleural effusions. Electronically Signed   By: Davina Poke M.D.   On: 07/19/2019 15:50    Chart has been reviewed    Assessment/Plan   84 y.o. female with medical history significant of chronic ITP, CKD stage3, anemia of chronic disease, A.fib on Eliquis, sleep apnea on CPAP history of CHF HLD history of pulmonary embolism in 2010 diabetes type 2, HTN, hypothyroidism, pressure ulcer    Admitted for  acute respiratory failure with hypoxia secondary to diffuse pneumonitis   Present on Admission: . Pneumonitis - unclear etiology PCCM has been consulted discussed patient will take night recommended restarting antibiotics broad-spectrum coverage for tonight restarting steroids Solu-Medrol at 40 every 6 hours agreeing with BiPAP PCCM consult in the morning  . Hypothyroidism - - Check TSH continue home medications at current dose  . HYPERCHOLESTEROLEMIA -chronic stable continue home medications  . OBESITY -would benefit from outpatient nutrition consult  . Essential hypertension chronic stable continue home metoclopramide  . Acute respiratory failure with hypoxia (HCC) -  imaging showing worsening bilateral infiltrates concerning for early ARDS.  Discussed with family patient does not wish to be intubated continue BiPAP as this is helped her in the past.  Worrisome for poor prognosis given progressive severe pulmonary involvement of unclear etiology Covid has been negative repeatedly.   . Chronic kidney disease (CKD) stage G4/A1, severely decreased glomerular filtration rate (GFR) between 15-29 mL/min/1.73 square meter and albuminuria creatinine ratio less than 30 mg/g (HCC) chronic stable avoid nephrotoxic medications  . Chronic ITP (idiopathic thrombocytopenia) (HCC) -chronic currently stable followed by Dr. Marin [name] as an outpatient  . Normocytic anemia: Appears to be at baseline  .  Atypical atrial flutter (HCC) -          - CHA2DS2 vas score >3 : continue current anticoagulation with   Eliquis,    After cardioversion patient remained in sinus rhythm No Longer on diltiazem  . HCAP (healthcare-associated pneumonia) -versus pneumonitis unclear if there is infectious process going on in the past during the past admission antibiotics were used unclear if any improvement.  Discussed with PCCM who at this point recommend restart antibiotics will see in the morning and reevaluate Will obtain sputum cultures  Back pain on physical  exam was able to reproduce by palpation of paraspinous muscles which appear to be spasmed.  Most likely musculoskeletal etiology if it is not improved may need further investigation  Other plan as per orders.  DVT prophylaxis:  eliquis  Code Status:   DNR/DNI  as per  family  I had personally discussed CODE STATUS with patient and family   Family Communication:   Family   at  Bedside  plan of care was discussed  With Daughter,   Disposition Plan:       Back to current facility when stable                                              Would benefit from PT/OT eval prior to DC  Ordered                   Swallow eval - SLP ordered                   Social Work  consulted                   Nutrition    consulted                Consults called PCCM   Admission status:  ED Disposition    ED Disposition Condition Comment   Admit  The patient appears reasonably stabilized for admission considering the current resources, flow, and capabilities available in the ED at this time, and I doubt any other [facility] requiring further screening and/or treatment in the ED prior to admission is  present.        inpatient     Expect 2 midnight stay secondary to severity of patient's current illness including   hemodynamic instability despite optimal treatment (  tachypnea  Hypoxia ) * Severe lab/radiological/exam abnormalities including:     and extensive  comorbidities including:  DM2   CHF    Morbid Obesity   CKD .  Chronic anticoagulation  That are currently affecting medical management.   I expect  patient to be hospitalized for 2 midnights requiring inpatient medical care.  Patient is at high risk for adverse outcome (such as loss of life or disability) if not treated.  Indication for inpatient stay as follows:  Severe change from baseline regarding mental status Hemodynamic instability despite maximal medical therapy,    New or worsening hypoxia  Need for BIPAP     Level of care       SDU tele indefinitely please discontinue once patient no longer qualifies  Precautions:  NONE   No active isolations  PPE: Used by the provider:   P100  eye Goggles,  Gloves       Hanley Rispoli 07/19/2019, 9:34 PM    [facility]     after 2 AM please page floor coverage PA If 7AM-7PM, please contact the day team taking care of the patient using [email or web address]

## 2019-07-19 NOTE — ED Notes (Signed)
Called report to floor  Informed pt family of visiting hours policy on floor  Pt family requests to speak with nurse manager  Informed charge of situation. Informed floor of delay

## 2019-07-19 NOTE — ED Notes (Signed)
Pt. Documented in error see above note in chart. 

## 2019-07-19 NOTE — Telephone Encounter (Signed)
Call received from patient's daughter [name] to inform Dr. Marin Olp that pt was discharged from the hospital yesterday and is being re-admitted today for low blood counts.  Dr. Marin Olp notified.

## 2019-07-19 NOTE — ED Notes (Signed)
[phone] Richardean Sale) daughter

## 2019-07-19 NOTE — Progress Notes (Signed)
Pharmacy Antibiotic Note  [name] is a 84 y.o. female admitted on 07/19/2019 with pneumonia.  Pharmacy has been consulted for cefepime and vancomycin dosing.  Plan: Cefepime 2 Gm IV q24h Zmax 500 mg IV q24h (MD) Vancomycin 2 Gm x1 F/u scr/VR/culutures     Temp (24hrs), Avg:97.5 F (36.4 C), Min:97.5 F (36.4 C), Max:97.5 F (36.4 C)  Recent Labs  Lab 07/15/19 0409 07/16/19 0430 07/17/19 0528 07/18/19 0418 07/19/19 1615  WBC 10.3 14.1* 10.3 11.5* 15.4*  CREATININE 1.98* 1.96* 2.12* 1.99* 2.16*    Estimated Creatinine Clearance: 23.2 mL/min (A) (by C-G formula based on SCr of 2.16 mg/dL (H)).    Allergies  Allergen Reactions  . Adhesive [Tape] Other (See Comments)    PULLS OFF THE SKIN  . Apixaban Other (See Comments)    Internal Bleeding  . Aspirin Itching, Rash, Hives and Swelling    Swelling of her tongue  . Mirabegron Other (See Comments)    Patient experienced A-Fib  . Pineapple Anaphylaxis and Swelling    Throat swells and blisters on tongue and roof of mouth per patient  . Metformin Diarrhea and Nausea Only  . Tetracycline Hives  . Fluticasone-Salmeterol Itching and Rash  . Iodinated Diagnostic Agents Itching and Rash       . Lactose Intolerance (Gi) Other (See Comments)    Gas   . Latex Itching, Rash and Other (See Comments)    Pulls off the skin and causes welts  . Lisinopril Cough  . Metronidazole Other (See Comments)    Reaction not known  . Sulfa Antibiotics Rash  . Sulfonamide Derivatives Itching and Rash    Antimicrobials this admission: 11/11 cefepime >>  11/11 zmax >>  11/11 vancomycin >>  Dose adjustments this admission:   Microbiology results:  BCx:   UCx:    Sputum:    MRSA PCR:   Thank you for allowing pharmacy to be a part of this patient's care.  Dorrene German 07/19/2019 9:39 PM

## 2019-07-19 NOTE — ED Provider Notes (Addendum)
[facility] DEPT Provider Note   CSN: [id number] Arrival date & time: 07/19/19  1359     History   Chief Complaint Chief Complaint  Patient presents with  . Shortness of Breath  . Abnormal Lab    low hgb     HPI [name] is a 84 y.o. female.     84 year old female who just was discharged from hospital yesterday after admission for hypoxic failure secondary to pneumonitis who is a DNR who presents with reported low hemoglobin 7.9.  She denies any active GI bleeding at this time.  Notes her dyspnea is slightly worse.  She is chronically on 6 L of oxygen per nasal cannula.  Denies any chest pain or chest pressure.  No new lower extremity swelling.  EMS called and patient's O2 sats were in 80s and placed on nonrebreather.  Was sent here for further management.     Past Medical History:  Diagnosis Date  . Allergic rhinitis   . Anxiety state, unspecified    panic attacks  . CHF (congestive heart failure) ([place])   . Depressive disorder, not elsewhere classified   . Extrinsic asthma, unspecified    no problem since adulthood  . Obesity   . OSA on CPAP    severe  . Pure hypercholesterolemia   . Respiratory failure with hypoxia ([place]) 09/2008   acute, secondary to multiple bilateral pulmonary embolism , negative hypercoagulable workup 09/2008 hospital stay  . Scoliosis   . Type II or unspecified type diabetes mellitus without mention of complication, not stated as uncontrolled   . Unspecified essential hypertension   . Unspecified hypothyroidism    hypo    Patient Active Problem List   Diagnosis Date Noted  . Pneumonitis 07/14/2019  . Pressure injury of skin 07/10/2019  . HCAP (healthcare-associated pneumonia)   . Acute hypoxemic respiratory failure ([place]) 07/03/2019  . Atypical atrial flutter ([place])   . Normocytic anemia 05/18/2017  . Chronic ITP (idiopathic thrombocytopenia) (H[facility]) 01/25/2017  . Chronic kidney disease (CKD) stage G4/A1,  severely decreased glomerular filtration rate (GFR) between 15-29 mL/min/1.73 square meter and albuminuria creatinine ratio less than 30 mg/g (HCC) 09/04/2015  . Chronic kidney disease (CKD), stage IV (severe) ([place]) 09/04/2015  . Acute respiratory failure with hypoxia ([place]) 08/24/2015  . GOUT 06/23/2010  . DERMATITIS 01/16/2010  . BACK PAIN 06/13/2009  . MYALGIA 06/13/2009  . Anxiety state 12/13/2008  . LEG EDEMA, CHRONIC 11/08/2008  . Sleep apnea 10/26/2008  . Hypothyroidism 09/27/2008  . HYPERCHOLESTEROLEMIA 09/27/2008  . OBESITY 09/27/2008  . DEPRESSION 09/27/2008  . Essential hypertension 09/27/2008  . ALLERGIC RHINITIS 09/27/2008  . ASTHMA, CHILDHOOD 09/27/2008    Past Surgical History:  Procedure Laterality Date  . CARDIOVERSION N/A 06/26/2019   Procedure: CARDIOVERSION;  Surgeon: Jerline Pain, MD;  Location: [facility] ENDOSCOPY;  Service: Cardiovascular;  Laterality: N/A;  . EYE SURGERY    . JOINT REPLACEMENT    . knee replaced    . THYROID SURGERY       OB History   No obstetric history on file.      Home Medications    Prior to Admission medications   Medication Sig Start Date End Date Taking? Authorizing Provider  acetaminophen (TYLENOL) 325 MG tablet Take 650 mg by mouth every 6 (six) hours as needed for moderate pain or fever (for fever or pain/discomfort).     [provider]  amoxicillin (AMOXIL) 500 MG capsule Take 1,000-2,000 mg by mouth See  admin instructions. Take 1,000 mg by mouth one hour before dental appointments and 1,000 mg afterwards AS DIRECTED    [provider]  apixaban (ELIQUIS) 2.5 MG TABS tablet Take 1 tablet (2.5 mg total) by mouth 2 (two) times daily. 04/18/19   Jerline Pain, MD  atorvastatin (LIPITOR) 40 MG tablet Take 40 mg by mouth at bedtime.     [provider]  bisacodyl (DULCOLAX) 5 MG EC tablet Take 10 mg by mouth every 3 (three) days as needed (for constipation).     [provider]  Cholecalciferol  (VITAMIN D3) 2000 units TABS Take 2,000 Units by mouth daily.    [provider]  Continuous Blood Gluc Sensor (FREESTYLE LIBRE 14 DAY SENSOR) MISC Inject 1 patch into the skin every 14 (fourteen) days.    [provider]  diltiazem (DILACOR XR) 180 MG 24 hr capsule Take 180 mg by mouth every evening.     [provider]  diphenhydrAMINE (BANOPHEN) 25 MG tablet Take 25 mg by mouth as needed ("for adverse food reactions").     [provider]  docusate sodium (COLACE) 100 MG capsule Take 100 mg by mouth daily.  04/30/15   [provider]  Dulaglutide (TRULICITY) 1.5 QH/4.7ML SOPN Inject 1.5 mg into the skin once a week. Patient taking differently: Inject 1.5 mg into the skin every Sunday.  09/09/18   Philemon Kingdom, MD  fenofibrate 54 MG tablet TAKE 1 TABLET(54 MG) BY MOUTH DAILY Patient taking differently: Take 54 mg by mouth daily.  08/04/17   Nafziger, Tommi Rumps, NP  fexofenadine (ALLEGRA) 180 MG tablet Take 180 mg by mouth at bedtime.     [provider]  Flaxseed, Linseed, (FLAXSEED OIL) 1000 MG CAPS Take 1,000 mg by mouth 2 (two) times daily.     [provider]  furosemide (LASIX) 20 MG tablet Take 20 mg by mouth See admin instructions. Take 20 mg by mouth in the morning on Sun/Tues/Thurs/Sat    [provider]  furosemide (LASIX) 40 MG tablet Take 40 mg by mouth See admin instructions. Take 40 mg by mouth in the morning on Mon/Wed/Fri 02/10/19   [provider]  HUMALOG 100 UNIT/ML injection Inject 25-35 Units into the skin See admin instructions. "Inject 35 units into the skin before breakfast, 30 units before lunch, and 25 units before supper, per sliding scale: BGL >90 = give the full dose for a regular meal, 60-90 = 17 units before breakfast, 15 units before lunch, and 12 units before supper; hold if <60" 02/10/19   [provider]  insulin glargine (LANTUS) 100 UNIT/ML injection Inject 0.4 mLs (40 Units total)  into the skin daily. Patient taking differently: Inject 40 Units into the skin 2 (two) times daily.  02/22/19   Philemon Kingdom, MD  Insulin Pen Needle (B-D UF III MINI PEN NEEDLES) 31G X 5 MM MISC Use daily for insulin injection 12/15/16   Nafziger, Tommi Rumps, NP  Insulin Syringe-Needle U-100 (INSULIN SYRINGE 1CC/30GX5/16") 30G X 5/16" 1 ML MISC 2 (two) times daily.  01/27/17   [provider]  levothyroxine (SYNTHROID, LEVOTHROID) 112 MCG tablet TAKE 1 TABLET BY MOUTH DAILY Patient taking differently: Take 112 mcg by mouth daily before breakfast.  11/24/17   Dorothyann Peng, NP  Multiple Vitamin (MULTIVITAMIN) tablet Take 1 tablet by mouth daily.      [provider]  omeprazole (PRILOSEC) 20 MG capsule Take 20 mg by mouth daily.  01/25/19   [provider]  ondansetron (ZOFRAN) 4 MG tablet Take 4 mg by mouth every 8 (eight) hours as needed for nausea or vomiting.    [provider]  oxymetazoline (AFRIN) 0.05 % nasal spray Place 2 sprays into both nostrils every 12 (twelve) hours as needed (for nose bleeds).    [provider]  Phenylephrine-APAP-guaiFENesin (TYLENOL SINUS SEVERE) 5-325-200 MG TABS Take 2 tablets by mouth every 4 (four) hours as needed (sinus headaches).    [provider]  polyethylene glycol (MIRALAX / GLYCOLAX) 17 g packet Take 17 g by mouth daily as needed (for constipation).     [provider]  potassium citrate (UROCIT-K) 10 MEQ (1080 MG) SR tablet Take 10 mEq by mouth 2 (two) times daily.    [provider]  predniSONE (DELTASONE) 5 MG tablet Take 7 tablets (35 mg total) by mouth daily with breakfast for 3 days, THEN 6 tablets (30 mg total) daily with breakfast for 5 days, THEN 5 tablets (25 mg total) daily with breakfast for 5 days, THEN 4 tablets (20 mg total) daily with breakfast for 5 days, THEN 3 tablets (15 mg total) daily with breakfast for 5 days, THEN 2 tablets (10 mg total) daily with breakfast for 5  days, THEN 1 tablet (5 mg total) daily with breakfast for 5 days. 07/19/19 08/21/19  Harvie Heck, MD  sertraline (ZOLOFT) 100 MG tablet Take 100 mg by mouth every evening.  05/24/18   [provider]  vitamin C (ASCORBIC ACID) 250 MG tablet Take 250 mg by mouth daily.    [provider]    Family History Family History  Problem Relation Age of Onset  . Allergies Mother   . Clotting disorder Mother   . Osteoarthritis Mother   . Asthma Mother   . Arthritis Other   . Diabetes Other   . Hyperlipidemia Other   . Hypertension Other   . Coronary artery disease Other   . Stroke Other   . Osteoarthritis Daughter   . Rheum arthritis Maternal Grandmother   . Clotting disorder Maternal Grandmother   . Clotting disorder Maternal Uncle   . Clotting disorder Daughter   . Allergies Daughter   . Breast cancer Neg Hx     Social History Social History   Tobacco Use  . Smoking status: Former Smoker    Packs/day: 0.25    Years: 20.00    Pack years: 5.00    Types: Cigarettes    Quit date: 09/07/1989    Years since quitting: 29.8  . Smokeless tobacco: Never Used  Substance Use Topics  . Alcohol use: No  . Drug use: No     Allergies   Adhesive [tape], Apixaban, Aspirin, Mirabegron, Pineapple, Metformin, Tetracycline, Fluticasone-salmeterol, Iodinated diagnostic agents, Lactose intolerance (gi), Latex, Lisinopril, Metronidazole, Sulfa antibiotics, and Sulfonamide derivatives   Review of Systems Review of Systems  All other systems reviewed and are negative.    Physical Exam Updated Vital Signs BP (!) 140/39 (BP Location: Left Arm)   Pulse 63   Temp (!) 97.5 F (36.4 C) (Oral)   Resp (!) 26   LMP  (LMP Unknown)   SpO2 90%   Physical Exam Vitals signs and nursing note reviewed.  Constitutional:      General: She is in acute distress.     Appearance: Normal appearance. She is well-developed. She is not toxic-appearing.  HENT:     Head: Normocephalic and  atraumatic.  Eyes:     General: Lids  are normal.     Conjunctiva/sclera: Conjunctivae normal.     Pupils: Pupils are equal, round, and reactive to light.  Neck:     Musculoskeletal: Normal range of motion and neck supple.     Thyroid: No thyroid mass.     Trachea: No tracheal deviation.  Cardiovascular:     Rate and Rhythm: Normal rate and regular rhythm.     Heart sounds: Normal heart sounds. No murmur. No gallop.   Pulmonary:     Effort: Tachypnea present. No respiratory distress.     Breath sounds: Normal breath sounds. No stridor. No decreased breath sounds, wheezing, rhonchi or rales.  Abdominal:     General: Bowel sounds are normal. There is no distension.     Palpations: Abdomen is soft.     Tenderness: There is no abdominal tenderness. There is no rebound.  Musculoskeletal: Normal range of motion.        General: No tenderness.  Skin:    General: Skin is warm and dry.     Coloration: Skin is pale.     Findings: No abrasion or rash.  Neurological:     Mental Status: She is alert and oriented to person, place, and time.     GCS: GCS eye subscore is 4. GCS verbal subscore is 5. GCS motor subscore is 6.     Cranial Nerves: No cranial nerve deficit.     Sensory: No sensory deficit.  Psychiatric:        Speech: Speech normal.        Behavior: Behavior normal.      ED Treatments / Results  Labs (all labs ordered are listed, but only abnormal results are displayed) Labs Reviewed  CBC WITH DIFFERENTIAL/PLATELET  BASIC METABOLIC PANEL  BRAIN NATRIURETIC PEPTIDE  TYPE AND SCREEN    EKG EKG Interpretation  Date/Time:  Wednesday July 19 2019 14:21:07 EST Ventricular Rate:  63 PR Interval:    QRS Duration: 104 QT Interval:  456 QTC Calculation: 467 R Axis:   80 Text Interpretation: Sinus or ectopic atrial rhythm Atrial premature complex Low voltage, precordial leads No significant change since last tracing Confirmed by Lacretia Leigh ([id number]) on 07/19/2019  3:55:24 PM   Radiology No results found.  Procedures Procedures (including critical care time)  Medications Ordered in ED Medications  0.9 %  sodium chloride infusion (has no administration in time range)     Initial Impression / Assessment and Plan / ED Course  I have reviewed the triage vital signs and the nursing notes.  Pertinent labs & imaging results that were available during my care of the patient were reviewed by me and considered in my medical decision making (see chart for details).        Patient's labs are pending at this time including her Hb.  Chest x-ray shows worsening airway opacities.  Will require admission once labs result.will sign out to Dr. Tomi Bamberger  Final Clinical Impressions(s) / ED Diagnoses   Final diagnoses:  None    ED Discharge Orders    None       Lacretia Leigh, MD 07/19/19 1619    Lacretia Leigh, MD 07/19/19 1620

## 2019-07-19 NOTE — ED Notes (Addendum)
Pt given diet cola and daughter given cup of ice.

## 2019-07-19 NOTE — Progress Notes (Signed)
eLink Physician-Brief Progress Note Patient Name: BRAYDEE SHIMKUS DOB: 02-26-1935 MRN: [id number]   Date of Service  07/19/2019  HPI/Events of Note  Pt recently discharged from the hospital with acute respiratory failure now readmitted with acute on chronic respiratory failure in the context of bilateral lung infiltrates. Pt admitted to hospitalist service but PCCM consulted.  eICU Interventions  New patient evaluation completed.        Kerry Kass Qualyn Oyervides 07/19/2019, 9:51 PM

## 2019-07-19 NOTE — ED Triage Notes (Signed)
[facility] pt from [facility] for SOB and low Hgb 7.9. Pt was on nasal canula 8L when EMS got to scene. Pt O2 still in 80s and was placed on NRB.  BP 154/70, HR 68, R 18, 86% O2 at 8L, 97% NRB.

## 2019-07-19 NOTE — ED Notes (Signed)
Dr Tomi Bamberger at bedside. Gave verbal order for Glucerna.

## 2019-07-19 NOTE — ED Notes (Signed)
ED TO INPATIENT HANDOFF REPORT  ED Nurse Name and Phone #: jon wled   S Name/Age/Gender [name] 84 y.o. female Room/Bed: WA14/WA14  Code Status   Code Status: Prior  Home/SNF/Other Skilled nursing facility Patient oriented to: self and place Is this baseline? Yes   Triage Complete: Triage complete  Chief Complaint shob  Triage Note [facility] pt from [facility] for SOB and low Hgb 7.9. Pt was on nasal canula 8L when EMS got to scene. Pt O2 still in 80s and was placed on NRB.  BP 154/70, HR 68, R 18, 86% O2 at 8L, 97% NRB.    Allergies Allergies  Allergen Reactions  . Adhesive [Tape] Other (See Comments)    PULLS OFF THE SKIN  . Apixaban Other (See Comments)    Internal Bleeding  . Aspirin Itching, Rash, Hives and Swelling    Swelling of her tongue  . Mirabegron Other (See Comments)    Patient experienced A-Fib  . Pineapple Anaphylaxis and Swelling    Throat swells and blisters on tongue and roof of mouth per patient  . Metformin Diarrhea and Nausea Only  . Tetracycline Hives  . Fluticasone-Salmeterol Itching and Rash  . Iodinated Diagnostic Agents Itching and Rash       . Lactose Intolerance (Gi) Other (See Comments)    Gas   . Latex Itching, Rash and Other (See Comments)    Pulls off the skin and causes welts  . Lisinopril Cough  . Metronidazole Other (See Comments)    Reaction not known  . Sulfa Antibiotics Rash  . Sulfonamide Derivatives Itching and Rash    Level of Care/Admitting Diagnosis ED Disposition    ED Disposition Condition [facility]: [place] [[id number]]  Level of Care: Stepdown [14]  Admit to SDU based on following criteria: Respiratory Distress:  Frequent assessment and/or intervention to maintain adequate ventilation/respiration, pulmonary toilet, and respiratory treatment.  Covid Evaluation: Confirmed COVID Negative  Diagnosis: Pneumonitis [601093]  Admitting Physician: Toy Baker [[id number]]  Attending Physician: Toy Baker [[id number]]  Estimated length of stay: 3 - 4 days  Certification:: I certify this patient will need inpatient services for at least 2 midnights  PT Class (Do Not Modify): Inpatient [101]  PT Acc Code (Do Not Modify): Private [1]       B Medical/Surgery History Past Medical History:  Diagnosis Date  . Allergic rhinitis   . Anxiety state, unspecified    panic attacks  . CHF (congestive heart failure) ([place])   . Depressive disorder, not elsewhere classified   . Extrinsic asthma, unspecified    no problem since adulthood  . Obesity   . OSA on CPAP    severe  . Pure hypercholesterolemia   . Respiratory failure with hypoxia ([place]) 09/2008   acute, secondary to multiple bilateral pulmonary embolism , negative hypercoagulable workup 09/2008 hospital stay  . Scoliosis   . Type II or unspecified type diabetes mellitus without mention of complication, not stated as uncontrolled   . Unspecified essential hypertension   . Unspecified hypothyroidism    hypo   Past Surgical History:  Procedure Laterality Date  . CARDIOVERSION N/A 06/26/2019   Procedure: CARDIOVERSION;  Surgeon: Jerline Pain, MD;  Location: [facility] ENDOSCOPY;  Service: Cardiovascular;  Laterality: N/A;  . EYE SURGERY    . JOINT REPLACEMENT    . knee replaced    . THYROID SURGERY       A IV  Location/Drains/Wounds Patient Lines/Drains/Airways Status   Active Line/Drains/Airways    Name:   Placement date:   Placement time:   Site:   Days:   Peripheral IV 07/19/19 Right Forearm   07/19/19    1615    Forearm   less than 1   Pressure Injury 07/08/19 Buttocks Right;Left;Medial;Lower Stage I -  Intact skin with non-blanchable redness of a localized area usually over a bony prominence. Red, nonblanchable,   07/08/19    1000     11          Intake/Output Last 24 hours No intake or output data in the 24 hours ending 07/19/19 2038  Labs/Imaging Results for orders placed  or performed during the hospital encounter of 07/19/19 (from the past 48 hour(s))  CBC with Differential     Status: Abnormal   Collection Time: 07/19/19  4:15 PM  Result Value Ref Range   WBC 15.4 (H) 4.0 - 10.5 K/uL   RBC 3.58 (L) 3.87 - 5.11 MIL/uL   Hemoglobin 10.3 (L) 12.0 - 15.0 g/dL   HCT 34.3 (L) 36.0 - 46.0 %   MCV 95.8 80.0 - 100.0 fL   MCH 28.8 26.0 - 34.0 pg   MCHC 30.0 30.0 - 36.0 g/dL   RDW 19.8 (H) 11.5 - 15.5 %   Platelets 224 150 - 400 K/uL   nRBC 0.1 0.0 - 0.2 %   Neutrophils Relative % 91 %   Neutro Abs 14.0 (H) 1.7 - 7.7 K/uL   Lymphocytes Relative 3 %   Lymphs Abs 0.5 (L) 0.7 - 4.0 K/uL   Monocytes Relative 3 %   Monocytes Absolute 0.4 0.1 - 1.0 K/uL   Eosinophils Relative 0 %   Eosinophils Absolute 0.0 0.0 - 0.5 K/uL   Basophils Relative 0 %   Basophils Absolute 0.0 0.0 - 0.1 K/uL   Immature Granulocytes 3 %   Abs Immature Granulocytes 0.51 (H) 0.00 - 0.07 K/uL    Comment: Performed at [facility], [place] [address], [place], [place] [address]  BMET     Status: Abnormal   Collection Time: 07/19/19  4:15 PM  Result Value Ref Range   Sodium 140 135 - 145 mmol/L   Potassium 4.8 3.5 - 5.1 mmol/L   Chloride 91 (L) 98 - 111 mmol/L   CO2 40 (H) 22 - 32 mmol/L   Glucose, Bld 185 (H) 70 - 99 mg/dL   BUN 103 (H) 8 - 23 mg/dL    Comment: RESULTS CONFIRMED BY MANUAL DILUTION   Creatinine, Ser 2.16 (H) 0.44 - 1.00 mg/dL   Calcium 10.6 (H) 8.9 - 10.3 mg/dL   GFR calc non Af Amer 21 (L) >60 mL/min   GFR calc Af Amer 24 (L) >60 mL/min   Anion gap 9 5 - 15    Comment: Performed at [facility], [place] [address] [place], [place] [address]  Type and screen     Status: None   Collection Time: 07/19/19  4:15 PM  Result Value Ref Range   ABO/RH(D) A NEG    Antibody Screen NEG    Sample Expiration      07/22/2019,2359 Performed at [facility], [place] [address], [place], [place] [address]   BNP     Status: Abnormal    Collection Time: 07/19/19  4:15 PM  Result Value Ref Range   B Natriuretic Peptide 282.1 (H) 0.0 - 100.0 pg/mL    Comment: Performed at [facility]  [place] [address] [place], [place] [address]  Blood gas, arterial     Status: Abnormal   Collection Time: 07/19/19  8:15 PM  Result Value Ref Range   O2 Content 8.0 L/min   pH, Arterial 7.451 (H) 7.350 - 7.450   pCO2 arterial 60.7 (H) 32.0 - 48.0 mmHg   pO2, Arterial 53.5 (L) 83.0 - 108.0 mmHg   Bicarbonate 41.9 (H) 20.0 - 28.0 mmol/L   Acid-Base Excess 15.7 (H) 0.0 - 2.0 mmol/L   O2 Saturation 87.9 %   Patient temperature 97.5     Comment: Performed at [facility], [place] [address] [place], [place] [address]   Dg Chest 2 View  Result Date: 07/19/2019 CLINICAL DATA:  Shortness of breath, hypoxia EXAM: CHEST - 2 VIEW COMPARISON:  07/10/2019 FINDINGS: Cardiomegaly, grossly stable. Calcific aortic knob. There are diffuse bilateral hazy airspace opacities, progressed from prior. Small bilateral pleural effusions. No pneumothorax. IMPRESSION: 1. Diffuse bilateral hazy airspace opacities have progressed since prior. Findings may represent pulmonary edema/ARDS versus multifocal pneumonia. 2. Small bilateral pleural effusions. Electronically Signed   By: Davina Poke M.D.   On: 07/19/2019 15:50    Pending Labs Unresulted Labs (From admission, onward)    Start     Ordered   07/19/19 1904  Urinalysis, Routine w reflex microscopic  Once,   STAT     07/19/19 1903   Signed and Held  Hemoglobin A1c  Once,   R    Comments: To assess prior glycemic control    Signed and Held          Vitals/Pain Today's Vitals   07/19/19 1830 07/19/19 1900 07/19/19 1930 07/19/19 2030  BP: (!) 160/55 (!) 164/56 (!) 152/42 (!) 149/52  Pulse: 61 61 61 63  Resp: (!) 31 (!) 30 (!) 24 (!) 22  Temp:      TempSrc:      SpO2: 91% 93% 92% 90%  PainSc:        Isolation Precautions No active  isolations  Medications Medications  0.9 %  sodium chloride infusion ( Intravenous Hold 07/19/19 1907)  feeding supplement (GLUCERNA SHAKE) (GLUCERNA SHAKE) liquid 237 mL (237 mLs Oral Given 07/19/19 1909)  dexamethasone (DECADRON) injection 4 mg (4 mg Intravenous Given 07/19/19 1755)    Mobility non-ambulatory High fall risk   Focused Assessments    R Recommendations: See Admitting Provider Note  Report given to:   Additional Notes:

## 2019-07-19 NOTE — ED Provider Notes (Addendum)
Patient was seen by Dr. Zenia Resides.  Please see his note.  Labs are notable for persistent elevated BUN and creatinine.  Not significantly changed from previous.  x-ray shows worsening airspace disease.  Plan is admission for further treatment.   Dorie Rank, MD 07/19/19 1745  Case discussed with Dr Roel Cluck for admission.  PCCM consulted, discussed with Dr Pia Mau.  Pt will be seen by pulmonary service tomorrow.   Dorie Rank, MD 07/19/19 [phone]

## 2019-07-19 NOTE — ED Notes (Signed)
Spoke with daughter in room with patient regarding Hgb that was resulted here. Daughter asking about pt's lung problems, "she has never had problems before, so this is new".  Made Daughter aware that would get Dr Tomi Bamberger to come speak with her regarding patient's lung problem/xray results.

## 2019-07-20 ENCOUNTER — Ambulatory Visit: Payer: Medicare Other

## 2019-07-20 ENCOUNTER — Other Ambulatory Visit: Payer: Medicare Other

## 2019-07-20 DIAGNOSIS — Z7952 Long term (current) use of systemic steroids: Secondary | ICD-10-CM

## 2019-07-20 DIAGNOSIS — Z9989 Dependence on other enabling machines and devices: Secondary | ICD-10-CM

## 2019-07-20 DIAGNOSIS — D693 Immune thrombocytopenic purpura: Secondary | ICD-10-CM

## 2019-07-20 DIAGNOSIS — R739 Hyperglycemia, unspecified: Secondary | ICD-10-CM

## 2019-07-20 DIAGNOSIS — J690 Pneumonitis due to inhalation of food and vomit: Secondary | ICD-10-CM

## 2019-07-20 DIAGNOSIS — J189 Pneumonia, unspecified organism: Secondary | ICD-10-CM

## 2019-07-20 DIAGNOSIS — J9601 Acute respiratory failure with hypoxia: Secondary | ICD-10-CM

## 2019-07-20 DIAGNOSIS — J9691 Respiratory failure, unspecified with hypoxia: Secondary | ICD-10-CM

## 2019-07-20 DIAGNOSIS — D649 Anemia, unspecified: Secondary | ICD-10-CM

## 2019-07-20 DIAGNOSIS — N189 Chronic kidney disease, unspecified: Secondary | ICD-10-CM

## 2019-07-20 LAB — CBC
HCT: 27.1 % — ABNORMAL LOW (ref 36.0–46.0)
Hemoglobin: 8.1 g/dL — ABNORMAL LOW (ref 12.0–15.0)
MCH: 29.1 pg (ref 26.0–34.0)
MCHC: 29.9 g/dL — ABNORMAL LOW (ref 30.0–36.0)
MCV: 97.5 fL (ref 80.0–100.0)
Platelets: 156 10*3/uL (ref 150–400)
RBC: 2.78 MIL/uL — ABNORMAL LOW (ref 3.87–5.11)
RDW: 19.4 % — ABNORMAL HIGH (ref 11.5–15.5)
WBC: 11 10*3/uL — ABNORMAL HIGH (ref 4.0–10.5)
nRBC: 0 % (ref 0.0–0.2)

## 2019-07-20 LAB — GLUCOSE, CAPILLARY
Glucose-Capillary: 231 mg/dL — ABNORMAL HIGH (ref 70–99)
Glucose-Capillary: 207 mg/dL — ABNORMAL HIGH (ref 70–99)
Glucose-Capillary: 255 mg/dL — ABNORMAL HIGH (ref 70–99)
Glucose-Capillary: 273 mg/dL — ABNORMAL HIGH (ref 70–99)
Glucose-Capillary: 351 mg/dL — ABNORMAL HIGH (ref 70–99)

## 2019-07-20 LAB — COMPREHENSIVE METABOLIC PANEL
ALT: 18 U/L (ref 0–44)
AST: 19 U/L (ref 15–41)
Albumin: 3.2 g/dL — ABNORMAL LOW (ref 3.5–5.0)
Alkaline Phosphatase: 38 U/L (ref 38–126)
Anion gap: 11 (ref 5–15)
BUN: 102 mg/dL — ABNORMAL HIGH (ref 8–23)
CO2: 36 mmol/L — ABNORMAL HIGH (ref 22–32)
Calcium: 9.1 mg/dL (ref 8.9–10.3)
Chloride: 90 mmol/L — ABNORMAL LOW (ref 98–111)
Creatinine, Ser: 1.94 mg/dL — ABNORMAL HIGH (ref 0.44–1.00)
GFR calc Af Amer: 27 mL/min — ABNORMAL LOW (ref >60)
GFR calc non Af Amer: 24 mL/min — ABNORMAL LOW (ref >60)
Glucose, Bld: 268 mg/dL — ABNORMAL HIGH (ref 70–99)
Potassium: 4.8 mmol/L (ref 3.5–5.1)
Sodium: 137 mmol/L (ref 135–145)
Total Bilirubin: 1 mg/dL (ref 0.3–1.2)
Total Protein: 6 g/dL — ABNORMAL LOW (ref 6.5–8.1)

## 2019-07-20 LAB — SEDIMENTATION RATE: Sed Rate: 30 mm/h — ABNORMAL HIGH (ref 0–22)

## 2019-07-20 LAB — HEMOGLOBIN A1C
Hgb A1c MFr Bld: 7.3 % — ABNORMAL HIGH (ref 4.8–5.6)
Mean Plasma Glucose: 162.81 mg/dL

## 2019-07-20 LAB — TSH: TSH: 0.355 u[IU]/mL (ref 0.350–4.500)

## 2019-07-20 LAB — MAGNESIUM: Magnesium: 2.3 mg/dL (ref 1.7–2.4)

## 2019-07-20 LAB — PHOSPHORUS: Phosphorus: 5.3 mg/dL — ABNORMAL HIGH (ref 2.5–4.6)

## 2019-07-20 LAB — STREP PNEUMONIAE URINARY ANTIGEN: Strep Pneumo Urinary Antigen: NEGATIVE

## 2019-07-20 LAB — MRSA PCR SCREENING: MRSA by PCR: POSITIVE — AB

## 2019-07-20 MED ORDER — MUPIROCIN 2 % EX OINT
1.0000 "application " | TOPICAL_OINTMENT | Freq: Two times a day (BID) | CUTANEOUS | Status: AC
  Administered 2019-07-20 – 2019-07-24 (×10): 1 via NASAL
  Filled 2019-07-20: qty 22

## 2019-07-20 MED ORDER — PANTOPRAZOLE SODIUM 40 MG PO TBEC
40.0000 mg | DELAYED_RELEASE_TABLET | Freq: Two times a day (BID) | ORAL | Status: DC
  Administered 2019-07-20 – 2019-08-01 (×23): 40 mg via ORAL
  Filled 2019-07-20 (×24): qty 1

## 2019-07-20 MED ORDER — ADULT MULTIVITAMIN W/MINERALS CH
1.0000 | ORAL_TABLET | Freq: Every day | ORAL | Status: DC
  Administered 2019-07-21 – 2019-08-01 (×12): 1 via ORAL
  Filled 2019-07-20 (×12): qty 1

## 2019-07-20 MED ORDER — METHYLPREDNISOLONE SODIUM SUCC 125 MG IJ SOLR
125.0000 mg | Freq: Four times a day (QID) | INTRAMUSCULAR | Status: DC
  Administered 2019-07-20 – 2019-07-21 (×3): 125 mg via INTRAVENOUS
  Filled 2019-07-20 (×3): qty 2

## 2019-07-20 MED ORDER — FUROSEMIDE 10 MG/ML IJ SOLN
40.0000 mg | Freq: Every day | INTRAMUSCULAR | Status: DC
  Administered 2019-07-20: 40 mg via INTRAVENOUS
  Filled 2019-07-20: qty 4

## 2019-07-20 MED ORDER — PRO-STAT SUGAR FREE PO LIQD
30.0000 mL | Freq: Two times a day (BID) | ORAL | Status: DC
  Administered 2019-07-20 – 2019-08-07 (×27): 30 mL via ORAL
  Filled 2019-07-20 (×29): qty 30

## 2019-07-20 NOTE — Progress Notes (Signed)
PROGRESS NOTE    [name]  FUX:[id number] DOB: April 10, 1935 DOA: 07/19/2019 PCP: Javier Glazier, MD   Brief Narrative:  [name] is a 84 y.o. female with medical history significant of chronic ITP, CKD stage3, anemia of chronic disease, A.fib on Eliquis, sleep apnea on CPAP history of CHF HLD history of pulmonary embolism in 2010 diabetes type 2, HTN, hypothyroidism, pressure ulcer Presented with   worsening shortness of breath from SNF. Multiple recent admissions in October, most recently she was discharged to SNF 1 day ago from [facility] where she was treated for acute respiratory failure and thought to be due to pneumonitis and was sent home with tapering dose of steroid. She was having increased oxygen requirement, initially managed with nonrebreather.  BiPAP was started this morning due to worsening respiratory status.  Chest x-ray with worsening of bilateral infiltrate, broader differential which includes pneumonitis, bilateral pneumonia, ARDS, pulmonary vascular congestion.  She was started on broad-spectrum antibiotics, Lasix and steroids.  PCCM is on board and does not think that there is much to offer for her.  Subjective: Patient was having worsening shortness of breath this morning.  Assessment & Plan:   Active Problems:   Hypothyroidism   HYPERCHOLESTEROLEMIA   OBESITY   Essential hypertension   Acute respiratory failure with hypoxia (HCC)   Chronic kidney disease (CKD) stage G4/A1, severely decreased glomerular filtration rate (GFR) between 15-29 mL/min/1.73 square meter and albuminuria creatinine ratio less than 30 mg/g (HCC)   Chronic ITP (idiopathic thrombocytopenia) (HCC)   Normocytic anemia   Atypical atrial flutter (HCC)   HCAP (healthcare-associated pneumonia)   Pneumonitis  Acute on chronic hypoxic and hypercapnic respiratory failure. Differential continue to remain broad as there is no definitive diagnosis at this time.   ARDS/pneumonitis/aspiration/pulmonary edema/PE. COVID-19 infection unlikely as negative PCR and no fever. PE unlikely as patient is on DOAC. Aspiration/bilateral pneumonia can be a possibility although patient is afebrile-we will continue broad-spectrum antibiotics with cefepime, vancomycin and azithromycin. BNP was not very impressive for heart failure, not much response to diuresis but we will continue to diurese and closely monitor renal function and her respiratory status to see her response. ESR is not very impressive either but patient was on steroid.  We will continue steroids at this time. Continue BiPAP support as patient does not want to be intubated. Very challenging situation and she might not have a good outcome. -Sputum culture is pending. -PCCM is following-we appreciate their help.  Diastolic heart failure.  Chest with bilateral crackles and 1+ lower extremity edema.  BNP was not in very impressive range. Patient was on home dose of p.o. Lasix. -Discontinue home dose of p.o. Lasix and start her on IV Lasix 40 mg daily. -Continue to diurese and monitor her response with renal function and breathing status.  Chronic ITP.  Patient recently received 2 doses of Nplate and Aranesp during previous hospitalization.  Under care of Dr. Marin Olp. Oncology is following-we appreciate their recommendations. Platelets stable currently.  Atypical atrial flutter.  Currently in sinus rhythm with history of recent cardioversion on 06/26/2019. -Continue Eliquis.  Hypertension.  Blood pressure mildly elevated. Just receiving Lasix at this time-we will monitor.  CKD.  Creatinine close to baseline. -Continue monitoring renal function. -Avoid nephrotoxic.  Type 2 diabetes.  A1c 7.3 on 07/19/2019. -Continue SSI.  Hypothyroidism.  TSH 0.838 on 07/04/2019. -Continue home dose of Synthroid.  OSA.  On nocturnal CPAP.  Objective: Vitals:   07/20/19 1139 07/20/19 1200 07/20/19  1300 07/20/19  1400  BP:  (!) 167/68 (!) 135/44 (!) 162/48  Pulse:  60 (!) 53 60  Resp:  (!) _0 Temp: 97.7 F (36.5 C)     TempSrc: Axillary     SpO2:  100% 96% 97%  Weight:      Height:        Intake/Output Summary (Last 24 hours) at 07/20/2019 1444 Last data filed at 07/20/2019 1200 Gross per 24 hour  Intake 546.51 ml  Output 1600 ml  Net -1053.49 ml   Filed Weights   07/20/19 0013  Weight: 105.7 kg    Examination:  General exam: Chronically ill-appearing elderly lady, mildly distressed due to breathing. Respiratory system: Bilateral crackles with decreased breath sounds, use of accessory muscles. Cardiovascular system: S1 & S2 heard, RRR. No JVD, murmurs, rubs, gallops or clicks. 1+ pedal edema. Gastrointestinal system: Abdomen is nondistended, soft and nontender. No organomegaly or masses felt. Normal bowel sounds heard. Central nervous system: Alert and oriented. No focal neurological deficits. Extremities: Symmetric 5 x 5 power. Skin: No rashes, lesions or ulcers Psychiatry: Judgement and insight appear normal. Mood & affect appropriate.   DVT prophylaxis: Eliquis Code Status: DNR Family Communication: No family at bedside. Disposition Plan: Pending improvement-prognosis appears poor.  Consultants:   PCCM  Oncology.  Procedures:   Antimicrobials:  Cefepime day 2 Vancomycin day 2 Azithromycin day 2  Data Reviewed: I have personally reviewed following labs and imaging studies  CBC: Recent Labs  Lab 07/16/19 0430 07/17/19 0528 07/18/19 0418 07/19/19 1615 07/20/19 0145  WBC 14.1* 10.3 11.5* 15.4* 11.0*  NEUTROABS  --   --   --  14.0*  --   HGB 10.2* 8.7* 8.5* 10.3* 8.1*  HCT 33.8* 29.3* 27.7* 34.3* 27.1*  MCV 95.8 94.8 94.2 95.8 97.5  PLT 180 155 143* 224 027   Basic Metabolic Panel: Recent Labs  Lab 07/16/19 0430 07/17/19 0528 07/18/19 0418 07/19/19 1615 07/20/19 0145  NA 143 143 141 140 137  K 5.1 4.9 5.6* 4.8 4.8  CL 95* 95* 95* 91* 90*   CO2 37* 39* 35* 40* 36*  GLUCOSE 207* 117* 165* 185* 268*  BUN 114* 114* 112* 103* 102*  CREATININE 1.96* 2.12* 1.99* 2.16* 1.94*  CALCIUM 11.6* 11.0* 10.6* 10.6* 9.1  MG  --   --   --   --  2.3  PHOS  --   --   --   --  5.3*   GFR: Estimated Creatinine Clearance: 25.5 mL/min (A) (by C-G formula based on SCr of 1.94 mg/dL (H)). Liver Function Tests: Recent Labs  Lab 07/20/19 0145  AST 19  ALT 18  ALKPHOS 38  BILITOT 1.0  PROT 6.0*  ALBUMIN 3.2*   No results for input(s): LIPASE, AMYLASE in the last 168 hours. No results for input(s): AMMONIA in the last 168 hours. Coagulation Profile: No results for input(s): INR, PROTIME in the last 168 hours. Cardiac Enzymes: No results for input(s): CKTOTAL, CKMB, CKMBINDEX, TROPONINI in the last 168 hours. BNP (last 3 results) No results for input(s): PROBNP in the last 8760 hours. HbA1C: Recent Labs    07/19/19 2131  HGBA1C 7.3*   CBG: Recent Labs  Lab 07/18/19 1632 07/19/19 2312 07/20/19 0344 07/20/19 0800 07/20/19 1130  GLUCAP 327* 275* 231* 207* 255*   Lipid Profile: No results for input(s): CHOL, HDL, LDLCALC, TRIG, CHOLHDL, LDLDIRECT in the last 72 hours. Thyroid Function Tests: Recent Labs    07/20/19 0145  TSH 0.355   Anemia Panel: No results for input(s): VITAMINB12, FOLATE, FERRITIN, TIBC, IRON, RETICCTPCT in the last 72 hours. Sepsis Labs: Recent Labs  Lab 07/19/19 2214  PROCALCITON <0.10    Recent Results (from the past 240 hour(s))  Urine Culture     Status: None   Collection Time: 07/11/19 11:30 AM   Specimen: Urine, Catheterized  Result Value Ref Range Status   Specimen Description URINE, CATHETERIZED  Final   Special Requests NONE  Final   Culture   Final    NO GROWTH Performed at [facility], [address] [place], [place] [address]    Report Status 07/12/2019 FINAL  Final  SARS CORONAVIRUS 2 (TAT 6-24 HRS) Nasopharyngeal Nasopharyngeal Swab     Status: None   Collection Time:  07/18/19 10:11 AM   Specimen: Nasopharyngeal Swab  Result Value Ref Range Status   SARS Coronavirus 2 NEGATIVE NEGATIVE Final    Comment: (NOTE) SARS-CoV-2 target nucleic acids are NOT DETECTED. The SARS-CoV-2 RNA is generally detectable in upper and lower respiratory specimens during the acute phase of infection. Negative results do not preclude SARS-CoV-2 infection, do not rule out co-infections with other pathogens, and should not be used as the sole basis for treatment or other patient management decisions. Negative results must be combined with clinical observations, patient history, and epidemiological information. The expected result is Negative. Fact Sheet for Patients: SugarRoll.be Fact Sheet for Healthcare Providers: https://www.woods-mathews.com/ This test is not yet approved or cleared by the [place] FDA and  has been authorized for detection and/or diagnosis of SARS-CoV-2 by FDA under an Emergency Use Authorization (EUA). This EUA will remain  in effect (meaning this test can be used) for the duration of the COVID-19 declaration under Section 56 4(b)(1) of the Act, 21 U.S.C. section 360bbb-3(b)(1), unless the authorization is terminated or revoked sooner. Performed at [facility], [place] [address] [place], [place] [address]   MRSA PCR Screening     Status: Abnormal   Collection Time: 07/19/19  9:31 PM   Specimen: Nasal Mucosa; Nasopharyngeal  Result Value Ref Range Status   MRSA by PCR POSITIVE (A) NEGATIVE Final    Comment:        The GeneXpert MRSA Assay (FDA approved for NASAL specimens only), is one component of a comprehensive MRSA colonization surveillance program. It is not intended to diagnose MRSA infection nor to guide or monitor treatment for MRSA infections. RESULT CALLED TO, READ BACK BY AND VERIFIED WITH: ROTHERMEL,A @ [id number] ML [id number] BY POTEAT,S Performed at [facility],  [place] [address], [place],  [address]      Radiology Studies: Dg Chest 2 View  Result Date: 07/19/2019 CLINICAL DATA:  Shortness of breath, hypoxia EXAM: CHEST - 2 VIEW COMPARISON:  07/10/2019 FINDINGS: Cardiomegaly, grossly stable. Calcific aortic knob. There are diffuse bilateral hazy airspace opacities, progressed from prior. Small bilateral pleural effusions. No pneumothorax. IMPRESSION: 1. Diffuse bilateral hazy airspace opacities have progressed since prior. Findings may represent pulmonary edema/ARDS versus multifocal pneumonia. 2. Small bilateral pleural effusions. Electronically Signed   By: Davina Poke M.D.   On: 07/19/2019 15:50    Scheduled Meds: . apixaban  2.5 mg Oral BID  . atorvastatin  40 mg Oral QHS  . chlorhexidine  15 mL Mouth Rinse BID  . Chlorhexidine Gluconate Cloth  6 each Topical Daily  . feeding supplement (GLUCERNA SHAKE)  237 mL Oral TID BM  . feeding supplement (PRO-STAT SUGAR  FREE 64)  30 mL Oral BID  . fenofibrate  54 mg Oral Daily  . furosemide  40 mg Intravenous Daily  . insulin aspart  0-9 Units Subcutaneous Q4H  . insulin glargine  40 Units Subcutaneous QHS  . levothyroxine  112 mcg Oral QAC breakfast  . mouth rinse  15 mL Mouth Rinse q12n4p  . methylPREDNISolone (SOLU-MEDROL) injection  125 mg Intravenous Q6H  . [START ON 07/21/2019] multivitamin with minerals  1 tablet Oral Daily  . mupirocin ointment  1 application Nasal BID  . pantoprazole  40 mg Oral BID AC  . sertraline  100 mg Oral QPM  . sodium chloride flush  3 mL Intravenous Q12H  . vancomycin variable dose per unstable renal function (pharmacist dosing)   Does not apply See admin instructions   Continuous Infusions: . sodium chloride 10 mL/hr at 07/20/19 0631  . sodium chloride    . azithromycin Stopped (07/20/19 0106)  . ceFEPime (MAXIPIME) IV Stopped (07/19/19 2327)     LOS: 1 day   Time spent: 50 minutes.  Lorella Nimrod, MD [facility] Pager [phone]   If 7PM-7AM, please contact night-coverage [email or web address] Password [facility] 07/20/2019, 2:44 PM   This record has been created using Dragon voice recognition software. Errors have been sought and corrected,but may not always be located. Such creation errors do not reflect on the standard of care.

## 2019-07-20 NOTE — Consult Note (Signed)
NAME:  [name], MRN:  [id number], DOB:  Aug 24, 1935, LOS: 1 ADMISSION DATE:  07/19/2019, CONSULTATION DATE:  11/12 REFERRING MD:  Roel Cluck, CHIEF COMPLAINT:  Recurrent respiratory failure   Brief History   This 84 y/o female was admitted on 11/11 with recurrent hypoxemic/hypercarbic respiratory failure requiring Bipap support.  History of present illness   This 84 y/o female with extensive PMH as below resides in a SNF and has been experiencing progressively worsening SOB and DOE for several weeks.  She underwent elective cardioversion for chronic AF on 10/19 and was discharged on 2 L O2.   Prior to the cardioversion, she did not require home oxygen.  SOB on 10/20 was treated with albuterol neb, lasix, and increased O2.  Bronchitis was diagnosed on 10/23; her CXR at that time did not show any infiltrates.  Her O2 demands and symptomatology continued to increase and she was admitted on 10/26 with ARF requiring Bipap, treated for non-specific pneumonitis Connective tissue disease markers were negative. Felt possibly post viral pneumonitis. Marland Kitchen  She was treated with high dose steroids and ultimately discharged to SNF on 11/10 on pred taper and O2 at 4-6 L. 11/11 she became increasingly dyspneic with increasing O2 demands, requiring NRB to maintain spO2 > 90%.  She was diuresed with IV lasix which did not improve WOB and she continues to require Bipap.    Past Medical History  CKD Stage 3, Anemia of chronic disease,Atrial fibrillation on Elaquis, s/p cardioversion,OSA on CPAP,HFpEF ,HLD,Type II DM HTN,Hypothyroidism,Chronic ITP,PE 2010  Significant Hospital Events     Consults:  11/12 >> PCCM  Procedures:    Significant Diagnostic Tests:  11/11 PCXR >>  Stable cardiomegaly, diffuse bilateral opacities much progressed from prior CXR 11/2, small bilateral pleural effusions    10/27 CT Chest >> Widespread ground-glass opacities, small bilat pleural effusions Micro Data:  11/10 >>  Sars-CoV2 neg 11/10 >> MRSA PCR pos  Antimicrobials:  11/11 Vanco 2 gm x 1 11/11 Cefepime >> day 2 11/11 Azithromycin >> day 2  Interim history/subjective:  Lethargic on BIPAP  Objective   Blood pressure (Abnormal) 166/49, pulse (Abnormal) 55, temperature 97.8 F (36.6 C), temperature source Oral, resp. rate (Abnormal) 26, height _0  (1.549 m), weight 105.7 kg, SpO2 93 %.        Intake/Output Summary (Last 24 hours) at 07/20/2019 0904 Last data filed at 07/20/2019 0800 Gross per 24 hour  Intake 448.33 ml  Output 700 ml  Net -251.67 ml   Filed Weights   07/20/19 0013  Weight: 105.7 kg    Examination: General: Obese elderly female sitting up in bed, on Bipap. HENT: PERRL.  Exam limited due to Bipap mask. Lungs: Extremely diminished throughout. Cardiovascular: S1, S2, RRR, SB 50's without ectopy.  Abdomen: Obese, soft, nontender.  BS present. Extremities: Distal pulses 2+.  Lower extremities without pitting edema. Neuro: Somnolent, wakes to repeated stimulation, answers questions appropriately. GU: Producing clear yellow urine, external catheter.  Resolved Hospital Problem list     Assessment & Plan:   Recurrent acute on chronic hypoxic respiratory failure Diffuse pulmonary infiltrates of un-clear etiology PAF (cardioverted and in NSR since Oct 2020) CRI (stage III) Mild systolic (EF 50-93%) and grade I diastolic HF Obesity   OSA H/o ITP ( ROMIPLOSTIM & FERAHEME) Chronic anemia  DM  Pulmonary problem list:  Recurrent acute on chronic hypoxemic  respiratory failure in setting of diffuse patchy infiltrates.  Ddx: seemingly pneumonitis ? Drug related??, pulmonary edema, ALI. If  this is a pneumonitis it is not clear that it has been steroid responsive.  -baseline co2 40 correcting to expected bicarb in 60s. Suspect both a mix of chronic hypoxia and also possibly contraction alk PLAN: Sputum culture if able Not candidate for  bronch for BAL or OLBX given  respiratory failure  Continue abx (azith/cefepime and vanc day 2) Cont steroids Cont bipap; she does not want intubation Repeat lasix as able   Not really anything else to offer here. May not survive.    Best practice:  Diet: Carb modified Pain/Anxiety/Delirium protocol (if indicated): N/A VAP protocol (if indicated): N/A DVT prophylaxis: Elaquis GI prophylaxis: PPI Glucose control: Lantus + SSI q4 Mobility: As tolerated Code Status: DNR Family Communication: Per primary Disposition: ICU  Labs   CBC: Recent Labs  Lab 07/16/19 0430 07/17/19 0528 07/18/19 0418 07/19/19 1615 07/20/19 0145  WBC 14.1* 10.3 11.5* 15.4* 11.0*  NEUTROABS  --   --   --  14.0*  --   HGB 10.2* 8.7* 8.5* 10.3* 8.1*  HCT 33.8* 29.3* 27.7* 34.3* 27.1*  MCV 95.8 94.8 94.2 95.8 97.5  PLT 180 155 143* 224 932    Basic Metabolic Panel: Recent Labs  Lab 07/16/19 0430 07/17/19 0528 07/18/19 0418 07/19/19 1615 07/20/19 0145  NA 143 143 141 140 137  K 5.1 4.9 5.6* 4.8 4.8  CL 95* 95* 95* 91* 90*  CO2 37* 39* 35* 40* 36*  GLUCOSE 207* 117* 165* 185* 268*  BUN 114* 114* 112* 103* 102*  CREATININE 1.96* 2.12* 1.99* 2.16* 1.94*  CALCIUM 11.6* 11.0* 10.6* 10.6* 9.1  MG  --   --   --   --  2.3  PHOS  --   --   --   --  5.3*   GFR: Estimated Creatinine Clearance: 25.5 mL/min (A) (by C-G formula based on SCr of 1.94 mg/dL (H)). Recent Labs  Lab 07/17/19 0528 07/18/19 0418 07/19/19 1615 07/19/19 2214 07/20/19 0145  PROCALCITON  --   --   --  <0.10  --   WBC 10.3 11.5* 15.4*  --  11.0*    Liver Function Tests: Recent Labs  Lab 07/20/19 0145  AST 19  ALT 18  ALKPHOS 38  BILITOT 1.0  PROT 6.0*  ALBUMIN 3.2*   No results for input(s): LIPASE, AMYLASE in the last 168 hours. No results for input(s): AMMONIA in the last 168 hours.  ABG    Component Value Date/Time   PHART 7.451 (H) 07/19/2019 2015   PCO2ART 60.7 (H) 07/19/2019 2015   PO2ART 53.5 (L) 07/19/2019 2015   HCO3 41.9 (H)  07/19/2019 2015   TCO2 35 (H) 07/04/2019 1536   O2SAT 87.9 07/19/2019 2015     Coagulation Profile: No results for input(s): INR, PROTIME in the last 168 hours.  Cardiac Enzymes: No results for input(s): CKTOTAL, CKMB, CKMBINDEX, TROPONINI in the last 168 hours.  HbA1C: Hemoglobin A1C  Date/Time Value Ref Range Status  02/22/2019 11:20 AM 6.3 (A) 4.0 - 5.6 % Final  09/08/2018 10:06 AM 7.3 (A) 4.0 - 5.6 % Final   Hemoglobin A1c  Date/Time Value Ref Range Status  08/19/2017 02:00 PM 7.1 (H) 4.8 - 5.6 % Final    Comment:             Prediabetes: 5.7 - 6.4          Diabetes: >6.4          Glycemic control for adults with diabetes: <7.0  Hgb A1c MFr Bld  Date/Time Value Ref Range Status  07/19/2019 09:31 PM 7.3 (H) 4.8 - 5.6 % Final    Comment:    (NOTE) Pre diabetes:          5.7%-6.4% Diabetes:              >6.4% Glycemic control for   <7.0% adults with diabetes   12/15/2016 08:53 AM 7.0 (H) 4.6 - 6.5 % Final    Comment:    Glycemic Control Guidelines for People with Diabetes:Non Diabetic:  <6%Goal of Therapy: <7%Additional Action Suggested:  >8%     CBG: Recent Labs  Lab 07/18/19 0558 07/18/19 1124 07/18/19 1632 07/19/19 2312 07/20/19 0344  GLUCAP 159* 366* 327* 275* 231*    Review of Systems:   Review of Systems  Constitutional: Positive for malaise/fatigue. Negative for chills and fever.  HENT: Negative.   Eyes: Negative.   Respiratory: Positive for cough and shortness of breath. Negative for hemoptysis, sputum production and wheezing.   Cardiovascular: Positive for orthopnea and leg swelling. Negative for chest pain and palpitations.  Gastrointestinal: Negative for abdominal pain, nausea and vomiting.  Genitourinary: Negative.   Musculoskeletal: Negative.   Skin: Negative.   Neurological: Negative.   Endo/Heme/Allergies: Negative.   Psychiatric/Behavioral: Negative.    Past Medical History  She,  has a past medical history of Allergic rhinitis,  Anxiety state, unspecified, CHF (congestive heart failure) ([place]), Depressive disorder, not elsewhere classified, Extrinsic asthma, unspecified, Obesity, OSA on CPAP, Pure hypercholesterolemia, Respiratory failure with hypoxia ([place]) (09/2008), Scoliosis, Type II or unspecified type diabetes mellitus without mention of complication, not stated as uncontrolled, Unspecified essential hypertension, and Unspecified hypothyroidism.   Surgical History    Past Surgical History:  Procedure Laterality Date  . CARDIOVERSION N/A 06/26/2019   Procedure: CARDIOVERSION;  Surgeon: Jerline Pain, MD;  Location: [facility] ENDOSCOPY;  Service: Cardiovascular;  Laterality: N/A;  . EYE SURGERY    . JOINT REPLACEMENT    . knee replaced    . THYROID SURGERY       Social History   reports that she quit smoking about 29 years ago. Her smoking use included cigarettes. She has a 5.00 pack-year smoking history. She has never used smokeless tobacco. She reports that she does not drink alcohol or use drugs.   Family History   Her family history includes Allergies in her daughter and mother; Arthritis in an other family member; Asthma in her mother; Clotting disorder in her daughter, maternal grandmother, maternal uncle, and mother; Coronary artery disease in an other family member; Diabetes in an other family member; Hyperlipidemia in an other family member; Hypertension in an other family member; Osteoarthritis in her daughter and mother; Rheum arthritis in her maternal grandmother; Stroke in an other family member. There is no history of Breast cancer.   Allergies Allergies  Allergen Reactions  . Adhesive [Tape] Other (See Comments)    PULLS OFF THE SKIN  . Apixaban Other (See Comments)    Internal Bleeding  . Aspirin Itching, Rash, Hives and Swelling    Swelling of her tongue  . Mirabegron Other (See Comments)    Patient experienced A-Fib  . Pineapple Anaphylaxis and Swelling    Throat swells and blisters on tongue and  roof of mouth per patient  . Metformin Diarrhea and Nausea Only  . Tetracycline Hives  . Fluticasone-Salmeterol Itching and Rash  . Iodinated Diagnostic Agents Itching and Rash       . Lactose Intolerance (Gi)  Other (See Comments)    Gas   . Latex Itching, Rash and Other (See Comments)    Pulls off the skin and causes welts  . Lisinopril Cough  . Metronidazole Other (See Comments)    Reaction not known  . Sulfa Antibiotics Rash  . Sulfonamide Derivatives Itching and Rash     Home Medications  Prior to Admission medications   Medication Sig Start Date End Date Taking? Authorizing Provider  acetaminophen (TYLENOL) 325 MG tablet Take 650 mg by mouth every 6 (six) hours as needed for moderate pain or fever (for fever or pain/discomfort).    Yes [provider]  amoxicillin (AMOXIL) 500 MG capsule Take 1,000-2,000 mg by mouth See admin instructions. Take 1,000 mg by mouth one hour before dental appointments and 1,000 mg afterwards AS DIRECTED   Yes [provider]  apixaban (ELIQUIS) 2.5 MG TABS tablet Take 1 tablet (2.5 mg total) by mouth 2 (two) times daily. 04/18/19  Yes Jerline Pain, MD  atorvastatin (LIPITOR) 40 MG tablet Take 40 mg by mouth at bedtime.    Yes [provider]  bisacodyl (DULCOLAX) 5 MG EC tablet Take 10 mg by mouth every 3 (three) days as needed (for constipation).    Yes [provider]  Cholecalciferol (VITAMIN D3) 2000 units TABS Take 2,000 Units by mouth daily.   Yes [provider]  diphenhydrAMINE (BANOPHEN) 25 MG tablet Take 25 mg by mouth as needed ("for adverse food reactions").    Yes [provider]  docusate sodium (COLACE) 100 MG capsule Take 100 mg by mouth daily.  04/30/15  Yes [provider]  fenofibrate 54 MG tablet TAKE 1 TABLET(54 MG) BY MOUTH DAILY Patient taking differently: Take 54 mg by mouth daily.  08/04/17  Yes Nafziger, Tommi Rumps, NP  fexofenadine (ALLEGRA) 180 MG tablet Take 180 mg  by mouth at bedtime.    Yes [provider]  Flaxseed, Linseed, (FLAXSEED OIL) 1000 MG CAPS Take 1,000 mg by mouth 2 (two) times daily.    Yes [provider]  furosemide (LASIX) 20 MG tablet Take 20-40 mg by mouth See admin instructions. Take 20 mg by mouth in the morning on Sun/Tues/Thurs/Sat 41m on Mon/Wed/Friday   Yes [provider]  HUMALOG 100 UNIT/ML injection Inject 25-35 Units into the skin See admin instructions. "Inject 35 units into the skin before breakfast, 30 units before lunch, and 25 units before supper, per sliding scale: BGL >90 = give the full dose for a regular meal, 60-90 = 17 units before breakfast, 15 units before lunch, and 12 units before supper; hold if <60" 02/10/19  Yes [provider]  levothyroxine (SYNTHROID, LEVOTHROID) 112 MCG tablet TAKE 1 TABLET BY MOUTH DAILY Patient taking differently: Take 112 mcg by mouth daily before breakfast.  11/24/17  Yes Nafziger, CTommi Rumps NP  Multiple Vitamin (MULTIVITAMIN) tablet Take 1 tablet by mouth daily.     Yes [provider]  omeprazole (PRILOSEC) 20 MG capsule Take 20 mg by mouth daily.  01/25/19  Yes [provider]  potassium citrate (UROCIT-K) 10 MEQ (1080 MG) SR tablet Take 10 mEq by mouth 2 (two) times daily.   Yes [provider]  predniSONE (DELTASONE) 5 MG tablet Take 7 tablets (35 mg total) by mouth daily with breakfast for 3 days, THEN 6 tablets (30 mg total) daily with breakfast for 5 days, THEN 5 tablets (25 mg total) daily with breakfast for 5 days, THEN 4 tablets (20 mg  total) daily with breakfast for 5 days, THEN 3 tablets (15 mg total) daily with breakfast for 5 days, THEN 2 tablets (10 mg total) daily with breakfast for 5 days, THEN 1 tablet (5 mg total) daily with breakfast for 5 days. 07/19/19 08/21/19 Yes Aslam, Loralyn [place], MD  sertraline (ZOLOFT) 100 MG tablet Take 100 mg by mouth every evening.  05/24/18  Yes [provider]  vitamin C (ASCORBIC  ACID) 250 MG tablet Take 250 mg by mouth daily.   Yes [provider]  Continuous Blood Gluc Sensor (FREESTYLE LIBRE 14 DAY SENSOR) MISC Inject 1 patch into the skin every 14 (fourteen) days.    [provider]  Dulaglutide (TRULICITY) 1.5 JK/9.4OC SOPN Inject 1.5 mg into the skin once a week. Patient not taking: Reported on 07/19/2019 09/09/18   Philemon Kingdom, MD  insulin glargine (LANTUS) 100 UNIT/ML injection Inject 0.4 mLs (40 Units total) into the skin daily. Patient not taking: Reported on 07/19/2019 02/22/19   Philemon Kingdom, MD  Insulin Pen Needle (B-D UF III MINI PEN NEEDLES) 31G X 5 MM MISC Use daily for insulin injection 12/15/16   Nafziger, Tommi Rumps, NP  Insulin Syringe-Needle U-100 (INSULIN SYRINGE 1CC/30GX5/16") 30G X 5/16" 1 ML MISC 2 (two) times daily.  01/27/17   [provider]     Critical care time: NA     Erick Colace ACNP-BC [place] Pager # [phone] OR # [phone] if no answer

## 2019-07-20 NOTE — TOC Initial Note (Signed)
Transition of Care [facility]) - Initial/Assessment Note    Patient Details  Name: [name] MRN: [id number] Date of Birth: 1935-07-07  [facility]) CM/SW Contact:    Nila Nephew, LCSW Phone Number: [phone] 07/20/2019, 1:38 PM  Clinical Narrative:    Pt admitted with respiratory failure from [facility]. Resides in the ALF section but [facility] upon DC from [facility] that day.     High Readmission Risk screening completed due to score 29%. [facility] aware of pt's readmission. TOC team will follow to assist with disposition needs.           Expected Discharge Plan: Skilled Nursing Facility Barriers to Discharge: Other (comment)(not stable for discharge)    Expected Discharge Plan and Services Expected Discharge Plan: [place] Acute Care Choice: [place]   Expected Discharge Date: (unknown)                                    Prior Living Arrangements/Services   Lives with:: Facility Resident, Self                   Activities of Daily Living Home Assistive Devices/Equipment: Grab bars around toilet, Grab bars in shower, Hand-held shower hose, Walker (specify type), CPAP(front wheeled walker-[facility] can get necessary equipment for their residents) ADL Screening (condition at time of admission) Patient's cognitive ability adequate to safely complete daily activities?: Yes Is the patient deaf or have difficulty hearing?: No Does the patient have difficulty seeing, even when wearing glasses/contacts?: No Does the patient have difficulty concentrating, remembering, or making decisions?: Yes Patient able to express need for assistance with ADLs?: Yes Does the patient have difficulty dressing or bathing?: Yes Independently performs ADLs?: No Communication: Independent Dressing (OT): Needs assistance Is this a change from baseline?: Pre-admission baseline Grooming: Needs  assistance Is this a change from baseline?: Pre-admission baseline Feeding: Independent Bathing: Needs assistance Is this a change from baseline?: Pre-admission baseline Toileting: Needs assistance Is this a change from baseline?: Pre-admission baseline In/Out Bed: Needs assistance Is this a change from baseline?: Pre-admission baseline Walks in Home: Needs assistance Is this a change from baseline?: Pre-admission baseline Does the patient have difficulty walking or climbing stairs?: Yes(secondary to weakness) Weakness of Legs: Both Weakness of Arms/Hands: None   Admission diagnosis:  Pneumonitis [J18.9] Chronic kidney disease, unspecified CKD stage [N18.9] Patient Active Problem List   Diagnosis Date Noted  . Pneumonitis 07/14/2019  . Pressure injury of skin 07/10/2019  . HCAP (healthcare-associated pneumonia)   . Acute hypoxemic respiratory failure ([place]) 07/03/2019  . Atypical atrial flutter ([place])   . Normocytic anemia 05/18/2017  . Chronic ITP (idiopathic thrombocytopenia) (H[facility]) 01/25/2017  . Chronic kidney disease (CKD) stage G4/A1, severely decreased glomerular filtration rate (GFR) between 15-29 mL/min/1.73 square meter and albuminuria creatinine ratio less than 30 mg/g (H[facility]) 09/04/2015  . Chronic kidney disease (CKD), stage IV (severe) ([place]) 09/04/2015  . Acute respiratory failure with hypoxia ([place]) 08/24/2015  . GOUT 06/23/2010  . DERMATITIS 01/16/2010  . BACK PAIN 06/13/2009  . MYALGIA 06/13/2009  . Anxiety state 12/13/2008  . LEG EDEMA, CHRONIC 11/08/2008  . Sleep apnea 10/26/2008  . Hypothyroidism 09/27/2008  . HYPERCHOLESTEROLEMIA 09/27/2008  . OBESITY 09/27/2008  . DEPRESSION 09/27/2008  . Essential hypertension 09/27/2008  . ALLERGIC RHINITIS 09/27/2008  . ASTHMA, CHILDHOOD 09/27/2008  PCP:  Javier Glazier, MD Pharmacy:   [facility], [place] - [address] [address] Phone: [phone] Fax:  [phone]      Readmission Risk Interventions Readmission Risk Prevention Plan 07/20/2019  Transportation Screening Complete  PCP or Specialist Appt within 3-5 Days Not Complete  Not Complete comments unknown DC date  [place] or [place] Complete  Social Work Consult for [place] Planning/Counseling Not Complete  SW consult not completed comments following for needs  Palliative Care Screening Not Complete  Medication Review (RN Transport planner) Referral to Pharmacy  Some recent data might be hidden

## 2019-07-20 NOTE — Progress Notes (Signed)
Initial Nutrition Assessment  DOCUMENTATION CODES:   Morbid obesity  INTERVENTION:   - Continue Glucerna Shake po TID, each supplement provides 220 kcal and 10 grams of protein  - Pro-stat 30 ml po BID, each supplement provides 100 kcal and 15 grams of protein  - MVI with minerals daily  NUTRITION DIAGNOSIS:   Increased nutrient needs related to chronic illness (CHF) as evidenced by estimated needs.  GOAL:   Patient will meet greater than or equal to 90% of their needs  MONITOR:   PO intake, Supplement acceptance, Labs, Weight trends, Skin, I & O's  REASON FOR ASSESSMENT:   Other (Pressure Injury report)    ASSESSMENT:   84 year old female who presented to the ED on 11/11 with SOB. Pt had just been discharged from the hospital after admission for respiratory failure secondary to pneumonitis. PMH of chronic ITP, CKD stage III, anemia, atrial fibrillation, CHF, HLD, T2DM, HTN.   RD visited pt at bedside. Pt on BiPAP and did not awaken to RD voice or touch. No family at bedside. Unable to obtain diet and weight history at this time. Will re-attempt at follow-up.  Reviewed weight history in chart. Noted pt with progressive weight loss since May 2020 and a more rapid pace of weight loss since October 2020. Pt with a 9.1 kg weight loss since 06/08/19. This is a 7.9% weight loss in less than 2 months which is significant for timeframe.  Pt currently on carb modified diet with Glucerna Shakes ordered TID. RD will add Pro-stat to aid in meeting protein needs and promote wound healing. Will also add daily MVI.  Medications reviewed and include: Glucerna TID, fenofibrate, Lasix, SSI, Lantus 40 units daily, Solu-medrol, Protonix, IV abx IVF: NS @ 10 ml/hr  Labs reviewed: phosphorus 5.3, hemoglobin 8.1 CBG's: 231, 275  NUTRITION - FOCUSED PHYSICAL EXAM:    Most Recent Value  Orbital Region  No depletion  Upper Arm Region  No depletion  Thoracic and Lumbar Region  No depletion   Buccal Region  No depletion  Temple Region  No depletion  Clavicle Bone Region  No depletion  Clavicle and Acromion Bone Region  Mild depletion  Scapular Bone Region  No depletion  Dorsal Hand  Mild depletion  Patellar Region  No depletion  Anterior Thigh Region  No depletion  Posterior Calf Region  Mild depletion  Edema (RD Assessment)  Mild [BLE]  Hair  Reviewed  Eyes  Reviewed  Mouth  Reviewed  Skin  Reviewed  Nails  Reviewed       Diet Order:   Diet Order            Diet Carb Modified Fluid consistency: Thin; Room service appropriate? Yes  Diet effective now              EDUCATION NEEDS:   Not appropriate for education at this time  Skin:  Skin Assessment: Skin Integrity Issues: Skin Integrity Issues: Stage II: right buttocks  Last BM:  07/18/19  Height:   Ht Readings from Last 1 Encounters:  07/20/19 5\' 1"  (1.549 m)    Weight:   Wt Readings from Last 1 Encounters:  07/20/19 105.7 kg    Ideal Body Weight:  47.7 kg  BMI:  Body mass index is 44.03 kg/m.  Estimated Nutritional Needs:   Kcal:  1700-1900  Protein:  90-110 grams  Fluid:  >/= 1.7 L    Gaynell Face, MS, RD, LDN Inpatient Clinical Dietitian Pager: [phone] Weekend/After Hours:  [phone]  

## 2019-07-20 NOTE — Progress Notes (Signed)
PT Cancellation Note  Patient Details Name: [name] MRN: [id number] DOB: 05-31-1935   Cancelled Treatment:    Reason Eval/Treat Not Completed: Medical issues which prohibited therapy  Pt having SOB and on BiPAP.  Pt with recent admission to [facility] and discharged to [facility] 07/18/19.   Raidon Swanner,KATHrine E 07/20/2019, 2:26 PM Carmelia Bake, PT, DPT Acute Rehabilitation Services Office: [phone] Pager: [phone]

## 2019-07-20 NOTE — Progress Notes (Signed)
OT Cancellation Note  Patient Details Name: [name] MRN: [id number] DOB: [birth year]   Cancelled Treatment:    Reason Eval/Treat Not Completed: Other (comment).  Critical care going into pt's room; hold therapy today per nursing  [facility] 07/20/2019, 2:23 PM  Lesle Chris, OTR/L Acute Rehabilitation Services [phone] pager [phone] office 07/20/2019

## 2019-07-20 NOTE — Progress Notes (Signed)
Placed PT on BiPAP (14/7 which allowed for pt to state she is receiving enough air in and able to exhale comfortably), PT is on 65% with sp02 92% (which meets MD order for sp02 goal). PT did not appear to be in respiratory distress before application but has poor sp02 on [place]. PT states she does use cpap at home for osa.

## 2019-07-20 NOTE — Progress Notes (Addendum)
HEMATOLOGY-ONCOLOGY PROGRESS NOTE  SUBJECTIVE: Ms. Schroepfer presented to the ER from [facility] due to anemia and hypoxia. Hemoglobin at the facility was reported to be 7.9. She required up to 8L/min of O2. Was recently discharged on 07/18/2019 and was requiring 4-6 L at the time of discharge. In the ER, her WBC was 15.4, Hemoglobin 10.3 (hemoglobin was 8.5 at the time of discharge on 07/18/2019), and platelets were 224,000. BUN was 103 and creatinine 2.16. Chest x-ray showed diffuse bilateral hazy airspace opacities have progressed since prior x-ray on 07/10/2019 - findings may represent pulmonary edema/ARDS versus multifocal pneumonia, small bilateral pleural effusions. She has ben placed on BiPAP, antibiotics and steroids.  PCCM has been consulted.  When seen today, there is no family at the bedside.  She is able to open her eyes and speak to me briefly.  She appears to be short of breath.  Due to her shortness of breath, I was unable to obtain a thorough review of systems. During her last hospitalization, she required N-Plate and Aranesp.   REVIEW OF SYSTEMS:   Unable to obtain secondary to patient's shortness of breath.  I have reviewed the past medical history, past surgical history, social history and family history with the patient and they are unchanged from previous note.   PHYSICAL EXAMINATION:  Vitals:   07/20/19 0849 07/20/19 0900  BP:  (!) 146/48  Pulse:  (!) 56  Resp:  17  Temp:    SpO2: 93% 96%   Filed Weights   07/20/19 0013  Weight: 233 lb 0.4 oz (105.7 kg)    Intake/Output from previous day: 11/11 0701 - 11/12 0700 In: 448.3 [P.O.:200; IV Piggyback:248.3] Out: 250 [Urine:250]  GENERAL: Resting, awakens to my voice, on BiPAP. SKIN: No bruising or petechiae noted. LUNGS: Diminished breath sounds throughout HEART: regular rate & rhythm and no murmurs ABDOMEN:abdomen soft, non-tender and normal bowel sounds NEURO: Somnolent, awakens to my voice, able to answer  questions briefly  LABORATORY DATA:  I have reviewed the data as listed CMP Latest Ref Rng & Units 07/20/2019 07/19/2019 07/18/2019  Glucose 70 - 99 mg/dL 268(H) 185(H) 165(H)  BUN 8 - 23 mg/dL 102(H) 103(H) 112(H)  Creatinine 0.44 - 1.00 mg/dL 1.94(H) 2.16(H) 1.99(H)  Sodium 135 - 145 mmol/L 137 140 141  Potassium 3.5 - 5.1 mmol/L 4.8 4.8 5.6(H)  Chloride 98 - 111 mmol/L 90(L) 91(L) 95(L)  CO2 22 - 32 mmol/L 36(H) 40(H) 35(H)  Calcium 8.9 - 10.3 mg/dL 9.1 10.6(H) 10.6(H)  Total Protein 6.5 - 8.1 g/dL 6.0(L) - -  Total Bilirubin 0.3 - 1.2 mg/dL 1.0 - -  Alkaline Phos 38 - 126 U/L 38 - -  AST 15 - 41 U/L 19 - -  ALT 0 - 44 U/L 18 - -    Lab Results  Component Value Date   WBC 11.0 (H) 07/20/2019   HGB 8.1 (L) 07/20/2019   HCT 27.1 (L) 07/20/2019   MCV 97.5 07/20/2019   PLT 156 07/20/2019   NEUTROABS 14.0 (H) 07/19/2019    Dg Chest 2 View  Result Date: 07/19/2019 CLINICAL DATA:  Shortness of breath, hypoxia EXAM: CHEST - 2 VIEW COMPARISON:  07/10/2019 FINDINGS: Cardiomegaly, grossly stable. Calcific aortic knob. There are diffuse bilateral hazy airspace opacities, progressed from prior. Small bilateral pleural effusions. No pneumothorax. IMPRESSION: 1. Diffuse bilateral hazy airspace opacities have progressed since prior. Findings may represent pulmonary edema/ARDS versus multifocal pneumonia. 2. Small bilateral pleural effusions. Electronically Signed   By: Hart Carwin  Plundo M.D.   On: 07/19/2019 15:50   Ct Chest Wo Contrast  Result Date: 07/04/2019 CLINICAL DATA:  Acute respiratory illness, patient on BiPAP EXAM: CT CHEST WITHOUT CONTRAST TECHNIQUE: Multidetector CT imaging of the chest was performed following the standard protocol without IV contrast. COMPARISON:  Radiograph 07/04/2019, CT chest 09/15/2017 FINDINGS: Cardiovascular: Mild cardiomegaly with lipomatous hypertrophy of the intra-atrial septum and biatrial enlargement. No pericardial effusion. Coronary artery  calcifications are noted. Calcifications noted at the aortic root. Additional calcifications seen throughout the aortic arch and proximal great vessels with a normal caliber aorta and 3 vessel branching pattern. There is central pulmonary artery enlargement. Luminal evaluation is precluded in the absence of contrast media. Major venous structures are unremarkable. Mediastinum/Nodes: Stable appearance of a partially calcified right paraesophageal soft tissue density, 2.1 cm in size (axial 3 image 9). Shotty mediastinal adenopathy is similar to comparison exam without enlarged mediastinal or axillary adenopathy. Hilar lymph nodes are difficult to assess in the absence of contrast media. Lungs/Pleura: Diffuse airways thickening and scattered secretions. Widespread areas of ground-glass are present throughout both lungs. Such findings are superimposed on the more chronic subpleural reticular changes seen on comparison exam. Unable to visualize a right apical nodule seen on comparison due to the underlying parenchymal disease. There is a stable appearance of the 9 mm nodule in the right middle lobe. Scattered subpleural nodules seen on comparison are also difficult to visualize. Few punctate calcifications in both lungs likely reflect calcified granulomata. There are small bilateral effusions, right slightly greater than left. Fluid is also seen tracking along the fissures. Apical emphysematous changes are again noted. Upper Abdomen: Mild thickening of the left adrenal gland with surrounding hazy stranding, new from prior study. Finding may reflect adrenal congestion or adjacent process incompletely imaged on this study. Nodular thickening of the right adrenal gland is similar to prior. There is partial fatty replacement of the pancreas. Splenomegaly is noted as well. Musculoskeletal: No acute osseous abnormality or suspicious osseous lesion. Multilevel degenerative changes are present in the imaged portions of the  spine. Additional degenerative changes noted in both shoulders. IMPRESSION: 1. Widespread areas of ground-glass throughout both lungs, with associated diffuse airways thickening and scattered secretions, such findings could reflect ARDS, infection including atypical etiologies or severe widespread interstitial and alveolar edema. 2. Small bilateral effusions, right slightly greater than left. 3. Cardiomegaly with lipomatous hypertrophy of the intra-atrial septum and biatrial enlargement. 4. Enlargement of the main pulmonary artery, suggestive of pulmonary arterial hypertension. 5. Mild thickening of the left adrenal gland with surrounding hazy stranding, new from prior study. Finding may reflect adrenal congestion or adjacent process incompletely imaged on this study. 6. Aortic Atherosclerosis (ICD10-I70.0) and Emphysema (ICD10-J43.9). Electronically Signed   By: Lovena Le M.D.   On: 07/04/2019 23:45   Dg Chest Port 1 View  Result Date: 07/10/2019 CLINICAL DATA:  Shortness of breath and weakness, ARDS. EXAM: PORTABLE CHEST 1 VIEW COMPARISON:  07/04/2019 FINDINGS: Lungs are somewhat hypoinflated and demonstrate persistent moderate hazy bilateral airspace opacification with some sparing of the lung apices. This process may be due to edema/ARDS or infection. Likely small amount of associated bilateral pleural fluid/basilar atelectasis. Stable cardiomegaly. Remainder of the exam is unchanged. IMPRESSION: Stable bilateral airspace process which may be due to edema/ARDS or infection. Suggestion of small stable effusions/basilar atelectasis. Stable cardiomegaly. Electronically Signed   By: Marin Olp M.D.   On: 07/10/2019 07:26   Dg Chest Port 1 View  Result Date: 07/04/2019 CLINICAL DATA:  Acute respiratory failure EXAM: PORTABLE CHEST 1 VIEW COMPARISON:  July 03, 2019 FINDINGS: There is mild cardiomegaly. Aortic knob calcifications. There is hazy/fluffy airspace opacities seen throughout both lungs.  This appears unchanged since the prior exam. No acute osseous abnormality. IMPRESSION: Bilateral airspace opacities as on the prior exam. This could be due to multifocal pneumonia, ARDS, and/or pulmonary edema. Electronically Signed   By: Prudencio Pair M.D.   On: 07/04/2019 15:41   Dg Chest Port 1 View  Result Date: 07/03/2019 CLINICAL DATA:  Shortness of breath EXAM: PORTABLE CHEST 1 VIEW COMPARISON:  09/15/2017 FINDINGS: Grossly stable cardiomediastinal contours. Calcific aortic knob. There is pulmonary vascular congestion with diffuse airspace opacities bilaterally. No large pleural fluid collection. Patient positioning partially obscures the lung apices. No pneumothorax identified within these limitations. IMPRESSION: Diffuse bilateral airspace opacities which may reflect pulmonary alveolar edema versus multifocal pneumonia. Electronically Signed   By: Davina Poke M.D.   On: 07/03/2019 17:33    ASSESSMENT AND PLAN: 1.  Recurrent acute on chronic hypoxemia respiratory failure 2.  Pneumonitis 3.  Anemia which is multifactorial and due to CKD and acute illness 4.  Leukocytosis due to steroids and acute infection 5.  Chronic ITP 6.  CKD 7.  History of atrial flutter, status post cardioversion  -Labs from today have been reviewed.  Her platelet count is normal.  She last received Nplate 400 mcg on 54/56/2563. Her hemoglobin is 8.1 today which is consistent with her prior hemoglobin upon hospital discharge. She has been receiving Aranesp 300 mcg weekly.  Last dose was given on 07/18/2019.  It is not currently due. Recommend PRBC transfusion for hemoglobin less than 8.   -Continue treatment for her respiratory failure and pneumonitis per hospitalist and PCCM.   LOS: 1 day   Mikey Bussing, DNP, AGPCNP-BC, AOCNP 07/20/19   ADDENDUM: I agree with the above assessment by Erasmo Downer.  Thankfully, her platelet count has been doing okay.  Her platelet count this morning is 187,000.  Her hemoglobin  is 8.4.  I do not think that she is having any obvious bleeding.  She has been getting Aranesp.  I think her last dose was on 10 November.  Blood sugars are on the high side.  A lot of this is from the steroids that she takes.  I think the real issue is going to be her lungs.  Her chest x-ray does not look any better.  She still is on BiPAP.  I do not think she is a candidate for bronchoscopy.  Hopefully this is not ARDS that is trying to develop.  She actually looks pretty good.  She is alert.  She and I were able to have a good conversation.  I just wish that her lungs would get better.  We will follow along and try to help out with her hematologic issues.  Hopefully, her platelets are not going to be a problem.  Maybe, we can pull back on the Nplate a little bit and not have to go to every week.  I know that she will get fantastic care from all the staff in the ICU.  I appreciate their compassion and there caring for her.  [name], MD  Colossians 3:17

## 2019-07-21 LAB — RENAL FUNCTION PANEL
Albumin: 3.2 g/dL — ABNORMAL LOW (ref 3.5–5.0)
Anion gap: 13 (ref 5–15)
BUN: 105 mg/dL — ABNORMAL HIGH (ref 8–23)
CO2: 37 mmol/L — ABNORMAL HIGH (ref 22–32)
Calcium: 9.7 mg/dL (ref 8.9–10.3)
Chloride: 97 mmol/L — ABNORMAL LOW (ref 98–111)
Creatinine, Ser: 2.16 mg/dL — ABNORMAL HIGH (ref 0.44–1.00)
GFR calc Af Amer: 24 mL/min — ABNORMAL LOW (ref >60)
GFR calc non Af Amer: 21 mL/min — ABNORMAL LOW (ref >60)
Glucose, Bld: 280 mg/dL — ABNORMAL HIGH (ref 70–99)
Phosphorus: 5 mg/dL — ABNORMAL HIGH (ref 2.5–4.6)
Potassium: 4.9 mmol/L (ref 3.5–5.1)
Sodium: 147 mmol/L — ABNORMAL HIGH (ref 135–145)

## 2019-07-21 LAB — GLUCOSE, CAPILLARY
Glucose-Capillary: 250 mg/dL — ABNORMAL HIGH (ref 70–99)
Glucose-Capillary: 284 mg/dL — ABNORMAL HIGH (ref 70–99)
Glucose-Capillary: 286 mg/dL — ABNORMAL HIGH (ref 70–99)
Glucose-Capillary: 290 mg/dL — ABNORMAL HIGH (ref 70–99)
Glucose-Capillary: 291 mg/dL — ABNORMAL HIGH (ref 70–99)
Glucose-Capillary: 324 mg/dL — ABNORMAL HIGH (ref 70–99)
Glucose-Capillary: 324 mg/dL — ABNORMAL HIGH (ref 70–99)

## 2019-07-21 LAB — CBC
HCT: 28 % — ABNORMAL LOW (ref 36.0–46.0)
Hemoglobin: 8.4 g/dL — ABNORMAL LOW (ref 12.0–15.0)
MCH: 29.3 pg (ref 26.0–34.0)
MCHC: 30 g/dL (ref 30.0–36.0)
MCV: 97.6 fL (ref 80.0–100.0)
Platelets: 187 10*3/uL (ref 150–400)
RBC: 2.87 MIL/uL — ABNORMAL LOW (ref 3.87–5.11)
RDW: 19.9 % — ABNORMAL HIGH (ref 11.5–15.5)
WBC: 11.1 10*3/uL — ABNORMAL HIGH (ref 4.0–10.5)
nRBC: 0 % (ref 0.0–0.2)

## 2019-07-21 LAB — VANCOMYCIN, RANDOM: Vancomycin Rm: 14

## 2019-07-21 LAB — LEGIONELLA PNEUMOPHILA SEROGP 1 UR AG: L. pneumophila Serogp 1 Ur Ag: NEGATIVE

## 2019-07-21 MED ORDER — METHYLPREDNISOLONE SODIUM SUCC 125 MG IJ SOLR
125.0000 mg | Freq: Four times a day (QID) | INTRAMUSCULAR | Status: DC
  Administered 2019-07-21 – 2019-07-22 (×4): 125 mg via INTRAVENOUS
  Filled 2019-07-21 (×4): qty 2

## 2019-07-21 MED ORDER — SODIUM CHLORIDE 0.9 % IV SOLN
INTRAVENOUS | Status: AC
  Administered 2019-07-21: 600 mL via INTRAVENOUS

## 2019-07-21 MED ORDER — METHYLPREDNISOLONE SODIUM SUCC 125 MG IJ SOLR: 60.0000 mg | Freq: Two times a day (BID) | INTRAMUSCULAR | Status: DC

## 2019-07-21 MED ORDER — HYDRALAZINE HCL 20 MG/ML IJ SOLN
5.0000 mg | Freq: Once | INTRAMUSCULAR | Status: AC
  Administered 2019-07-21: 5 mg via INTRAVENOUS
  Filled 2019-07-21: qty 1

## 2019-07-21 MED ORDER — VANCOMYCIN HCL IN DEXTROSE 1-5 GM/200ML-% IV SOLN
1000.0000 mg | Freq: Once | INTRAVENOUS | Status: AC
  Administered 2019-07-22: 1000 mg via INTRAVENOUS
  Filled 2019-07-21: qty 200

## 2019-07-21 MED ORDER — AMLODIPINE BESYLATE 5 MG PO TABS
5.0000 mg | ORAL_TABLET | Freq: Every day | ORAL | Status: DC
  Administered 2019-07-21 – 2019-07-24 (×4): 5 mg via ORAL
  Filled 2019-07-21 (×4): qty 1

## 2019-07-21 NOTE — Progress Notes (Signed)
RN states she removed PT from BiPAP and placed on 15 lpm [place].

## 2019-07-21 NOTE — Progress Notes (Signed)
Inpatient Diabetes Program Recommendations  AACE/ADA: New Consensus Statement on Inpatient Glycemic Control (2015)  Target Ranges:  Prepandial:   less than 140 mg/dL      Peak postprandial:   less than 180 mg/dL (1-2 hours)      Critically ill patients:  140 - 180 mg/dL   Lab Results  Component Value Date   GLUCAP 291 (H) 07/21/2019   HGBA1C 7.3 (H) 07/19/2019  Late entry   Review of Glycemic Control  Diabetes history: DM2 Outpatient Diabetes medications: Trulicity 1.5 mg Q weekly on Sunday, Lantus 40 units bid, Humalog 35-30-25 units tid if blood sugar > 90. Meal coverage 17-15-12 units tidwc, Hold if blood sugar < 60 mg/dL Current orders for Inpatient glycemic control: Lantus 40 units QHS, Novolog 0-9 units Q4H. On Solumedrol 125 mg Q6H  Blood sugars 250-351 mg/dL.  Inpatient Diabetes Program Recommendations:     Increase Novolog to 0-20 units Q4H.  Will continue to follow.   Thank you. Lorenda Peck, RD, LDN, CDE Inpatient Diabetes Coordinator [phone]

## 2019-07-21 NOTE — Progress Notes (Signed)
Pharmacy Antibiotic Note  [name] is a 84 y.o. female admitted on 07/19/2019 with pneumonia.  Pharmacy has been consulted for cefepime and vancomycin dosing.  Plan:  Cefepime 2 Gm IV q24h  Zmax 500 mg IV q24h (MD)  Vancomycin 2 Gm x1 given on 11/11, Will obtain vancomycin tonight at 2200 in light of worsening renal function to determine need for another dose. Will hold off from starting a schedule dose due to  worsening renal function   F/u scr/VR/culutures  Height: 5\' 1"  (154.9 cm) Weight: 233 lb 0.4 oz (105.7 kg) IBW/kg (Calculated) : 47.8  Temp (24hrs), Avg:97.3 F (36.3 C), Min:97 F (36.1 C), Max:97.7 F (36.5 C)  Recent Labs  Lab 07/17/19 0528 07/18/19 0418 07/19/19 1615 07/20/19 0145 07/21/19 0243  WBC 10.3 11.5* 15.4* 11.0* 11.1*  CREATININE 2.12* 1.99* 2.16* 1.94* 2.16*    Estimated Creatinine Clearance: 22.9 mL/min (A) (by C-G formula based on SCr of 2.16 mg/dL (H)).    Allergies  Allergen Reactions  . Adhesive [Tape] Other (See Comments)    PULLS OFF THE SKIN  . Apixaban Other (See Comments)    Internal Bleeding  . Aspirin Itching, Rash, Hives and Swelling    Swelling of her tongue  . Mirabegron Other (See Comments)    Patient experienced A-Fib  . Pineapple Anaphylaxis and Swelling    Throat swells and blisters on tongue and roof of mouth per patient  . Metformin Diarrhea and Nausea Only  . Tetracycline Hives  . Fluticasone-Salmeterol Itching and Rash  . Iodinated Diagnostic Agents Itching and Rash       . Lactose Intolerance (Gi) Other (See Comments)    Gas   . Latex Itching, Rash and Other (See Comments)    Pulls off the skin and causes welts  . Lisinopril Cough  . Metronidazole Other (See Comments)    Reaction not known  . Sulfa Antibiotics Rash  . Sulfonamide Derivatives Itching and Rash    Antimicrobials this admission: 11/11 cefepime >>  11/11 zmax >>  11/11 vancomycin >>  Dose adjustments this admission:   Microbiology  results: 11/10 COVID-19: negative  11/11 MRSA PCR: positive 11/11: strep pneumo antigen: negative 11/11: Legionella antigen:   Thank you for allowing pharmacy to be a part of this patient's care.  Royetta Asal, PharmD, BCPS 07/21/2019 10:04 AM

## 2019-07-21 NOTE — Progress Notes (Signed)
PT Cancellation Note  Patient Details Name: [name] MRN: [id number] DOB: 1934-10-17   Cancelled Treatment:    Reason Eval/Treat Not Completed: Medical issues which prohibited therapy Pt just worked with OT and OT reports bed level evaluation.  Pt currently on HFNC and fluctuating saturations.  Pt also with high BP.  Will defer mobility today and check back as schedule permits.   Loretto Belinsky,KATHrine E 07/21/2019, 4:00 PM Carmelia Bake, PT, DPT Acute Rehabilitation Services Office: [phone] Pager: [phone]

## 2019-07-21 NOTE — Evaluation (Signed)
Occupational Therapy Evaluation Patient Details Name: [name] MRN: [id number] DOB: [birth year] Today's Date: 07/21/2019    History of Present Illness Pt is 84 y/o F with PMH: CKD stage 3, Anemia, Afib (on Elaquis), OSA on CPAP. Was d/c'ed to SNF from [facility] (tx'ed for acute respiratory failure) on 07/18/19 (several recent admissions). Pt re-presented to acute setting with increasing oxygen requirement (req'ed BiPaPand found to have worsening bilateral lung infiltrates on CXR, pneumonitis, b/l PNA, ARDS.   Clinical Impression   Pt seen for OT evaluation this date. Prior to hospital admission, pt was at [facility] for one day for rehab before increased O2 req't.  Pt lives in [place] with aides that assist with some aspects of dressing, bathing, and med mgt while pt was able to perform fxl mobility with MOD I with 4WW.  Currently pt demonstrates impairments in general fxl activity tolerance and global strength requiring MAX assist for LB ADL, MIN A for UB ADLs and mobility was deferred on this eval d/t O2 levels (see below for details).  Pt would benefit from skilled OT to address noted impairments and functional limitations (see below for any additional details) in order to maximize safety and independence while minimizing falls risk and caregiver burden.  Upon hospital discharge, recommend pt discharge to SNF.    Follow Up Recommendations  SNF    Equipment Recommendations  Other (comment)(defer to next venue of care.)    Recommendations for Other Services       Precautions / Restrictions Precautions Precautions: Fall Restrictions Weight Bearing Restrictions: No      Mobility Bed Mobility               General bed mobility comments: deferred  Transfers                 General transfer comment: deferred    Balance                                           ADL either performed or assessed with clinical judgement   ADL                                          General ADL Comments: Limited self care able to be observed at time of eval. RN stated pt asked for assistance with self feeding, but on eval, pt able to take drink from a cup of water provided with setup. Pt with generalized weakness. Pt total A to don socks at bed level in prep for attempting sit<>sup that ultimately did not take place d/t caution in regards to VS.     Vision Patient Visual Report: No change from baseline       Perception     Praxis      Pertinent Vitals/Pain Pain Assessment: No/denies pain     Hand Dominance Right   Extremity/Trunk Assessment Upper Extremity Assessment Upper Extremity Assessment: RUE deficits/detail;LUE deficits/detail RUE Deficits / Details: shoulder 3+/5, elbow 4-/5, grips 4-/5 LUE Deficits / Details: shoulder 3+/5, elbow 4-/5, grips 4-/5   Lower Extremity Assessment Lower Extremity Assessment: Defer to PT evaluation       Communication Communication Communication: No difficulties   Cognition Arousal/Alertness: Awake/alert Behavior During Therapy: WFL for tasks assessed/performed Overall Cognitive Status: Within Functional Limits for  tasks assessed                                 General Comments: some lethargy when OT initially presents, but pt gradually becomes more engaged.   General Comments  deferred    Exercises Other Exercises Other Exercises: OT facilitates education with pt and pt's daughter who is present re: importance of engaging to highest attainable level in all ADLs even while hospitalized to maintain strength/tolerance.   Shoulder Instructions      Home Living Family/patient expects to be discharged to:: Assisted living Living Arrangements: Other (Comment) Available Help at Discharge: Available PRN/intermittently Type of Home: Apartment       Home Layout: One level     Bathroom Shower/Tub: Occupational psychologist: Handicapped height     Home Equipment:  Environmental consultant - 2 wheels;Walker - 4 wheels;Cane - single point;Grab bars - toilet;Grab bars - tub/shower;Shower seat;Hand held shower head   Additional Comments: sleeps in lift chair      Prior Functioning/Environment Level of Independence: Needs assistance  Gait / Transfers Assistance Needed: used rollator on her own ADL's / Homemaking Assistance Needed: Aides assissted with med mgt, bathing, and some aspects of dressing.            OT Problem List: Decreased strength;Decreased activity tolerance;Impaired balance (sitting and/or standing);Decreased safety awareness;Decreased knowledge of use of DME or AE;Decreased knowledge of precautions;Cardiopulmonary status limiting activity;Impaired UE functional use      OT Treatment/Interventions: Self-care/ADL training;DME and/or AE instruction;Therapeutic exercise;Balance training;Patient/family education;Therapeutic activities    OT Goals(Current goals can be found in the care plan section) Acute Rehab OT Goals Patient Stated Goal: to go back to A living OT Goal Formulation: With patient Time For Goal Achievement: 08/04/19 Potential to Achieve Goals: Good  OT Frequency: Min 2X/week   Barriers to D/C:            Co-evaluation              AM-PAC OT "6 Clicks" Daily Activity     Outcome Measure Help from another person eating meals?: A Little Help from another person taking care of personal grooming?: A Little Help from another person toileting, which includes using toliet, bedpan, or urinal?: A Lot Help from another person bathing (including washing, rinsing, drying)?: A Lot Help from another person to put on and taking off regular upper body clothing?: A Little Help from another person to put on and taking off regular lower body clothing?: A Lot 6 Click Score: 15   End of Session Equipment Utilized During Treatment: Oxygen Nurse Communication: Mobility status  Activity Tolerance: Treatment limited secondary to medical  complications (Comment) Patient left: in bed;with call bell/phone within reach;with bed alarm set;with family/visitor present(with RT in room)  OT Visit Diagnosis: Muscle weakness (generalized) (M62.81)                Time: 8676-7209 OT Time Calculation (min): 25 min Charges:  OT General Charges $OT Visit: 1 Visit OT Evaluation $OT Eval Moderate Complexity: 1 Mod OT Treatments $Self Care/Home Management : 8-22 mins   Sharren Bridge 07/21/2019, 6:29 PM

## 2019-07-21 NOTE — Consult Note (Signed)
   [facility] CM Inpatient Consult   07/21/2019  [name] 1935-04-03 [id number]    Patient was reviewed for 7 day and 30 day readmissions, with 29% high risk score for unplanned readmissionand hospitalizations, as benefitfromher Medicare/ [employer] A[employer] plan.   Chart review and MD brief narrative dated 07/21/19 state as:    [name] a 84 y.o.femalewith medical history significant of chronic ITP, CKD stage3, anemia of chronic disease, A.fib on Eliquis,sleep apnea on CPAP history of CHF HLD history of pulmonary embolism in 2010 diabetes type 2, HTN,hypothyroidism, pressure ulcer.    Presented withworsening shortness of breathfromSNF. Multiple recent admissions in October, most recentlyshe was discharged to SNF from [facility] where she was treated for acute respiratory failure and thought to be due to pneumonitis and was sent home with tapering dose of steroid.   She was having increased oxygen requirement, initially managed with non-rebreather. BiPAP was started this morning due to worsening respiratory status. Chest x-ray with worsening of bilateral infiltrate, broader differential which includes pneumonitis, bilateral pneumonia, ARDS, pulmonary vascular congestion.     Chart reviewand TOC SW noterevealsthatpatient resides in the [facility](assisted living facility) section, with plan to discharge to the skilled nursing facility (SNF- where she was prior to this readmission).   Ifthere are anydisposition plan changes, please makeareferral to [facility] managementfor follow-upof needsas appropriate.   [facility] post acute RN coordinator will be made aware of patient's disposition to any facility covered by [employer] for post discharge follow up of needs.   For questions and additional information, please contact:  Louretta Tantillo A. Raegyn Renda, BSN, RN-BC [facility] Liaison Cell: [phone]

## 2019-07-21 NOTE — Progress Notes (Signed)
New order for stat solu-medrol due at 0645 ordered by Dr. Marin Olp. Pt given regular dose of solu-medrol scheduled every 6 hours at 0609. Unable to reach Dr. Marin Olp to ask about new dose. Will tell day shift nurse in report.  Ronny Flurry RN

## 2019-07-21 NOTE — Progress Notes (Signed)
PROGRESS NOTE    [name]  BPZ:[id number] DOB: 01-03-1935 DOA: 07/19/2019 PCP: Javier Glazier, MD   Brief Narrative:  [name] a 84 y.o.femalewith medical history significant of chronic ITP, CKD stage3, anemia of chronic disease, A.fib on Eliquis,sleep apnea on CPAP history of CHF HLD history of pulmonary embolism in 2010 diabetes type 2, HTN,hypothyroidism, pressure ulcer Presented withworsening shortness of breath from SNF. Multiple recent admissions in October, most recently she was discharged to SNF 1 day ago from [facility] where she was treated for acute respiratory failure and thought to be due to pneumonitis and was sent home with tapering dose of steroid. She was having increased oxygen requirement, initially managed with nonrebreather.  BiPAP was started this morning due to worsening respiratory status.  Chest x-ray with worsening of bilateral infiltrate, broader differential which includes pneumonitis, bilateral pneumonia, ARDS, pulmonary vascular congestion.  She was started on broad-spectrum antibiotics, Lasix and steroids.  PCCM is on board and does not think that there is much to offer for her.  Subjective: Patient was feeling comfortable when seen this morning.  She was sleeping comfortably on BiPAP however able to woke her up easily and responding appropriately.  Assessment & Plan:   Active Problems:   Hypothyroidism   HYPERCHOLESTEROLEMIA   OBESITY   Essential hypertension   Acute respiratory failure with hypoxia (HCC)   Chronic kidney disease (CKD) stage G4/A1, severely decreased glomerular filtration rate (GFR) between 15-29 mL/min/1.73 square meter and albuminuria creatinine ratio less than 30 mg/g (HCC)   Chronic kidney disease   Chronic ITP (idiopathic thrombocytopenia) (HCC)   Normocytic anemia   Atypical atrial flutter (HCC)   HCAP (healthcare-associated pneumonia)   Pneumonitis  Acute on chronic hypoxic and hypercapnic respiratory  failure. Differential continue to remain broad as there is no definitive diagnosis at this time.  ARDS/pneumonitis/aspiration/pulmonary edema/PE. COVID-19 infection unlikely as negative PCR and no fever. PE unlikely as patient is on DOAC. Aspiration/bilateral pneumonia can be a possibility although patient is afebrile-we will continue broad-spectrum antibiotics with cefepime, vancomycin and azithromycin. BNP was not very impressive for heart failure, not much response to diuresis. ESR is not very impressive either but patient was on steroid.  We will continue steroids at this time. -We will try switching her back to high flow nasal cannula today and see if she can tolerate it. -Hold Lasix for today as renal functions shows dehydration. -Give her some gentle fluids, NS at 75 mL/h for 4 hours. Very challenging situation and she might not have a good outcome. -Sputum culture is pending. -PCCM is following-we appreciate their help.  Diastolic heart failure.  BNP was not in very impressive range. Holding Lasix for today as renal functions worsening. Giving gentle fluids and monitor the response. -Continue strict intake and output.  Chronic ITP.  Patient recently received 2 doses of Nplate and Aranesp during previous hospitalization.  Under care of Dr. Marin Olp. Oncology is following-we appreciate their recommendations. Platelets stable currently. Continue Solu-Medrol 125 mg every 6 hourly.  Atypical atrial flutter.  Currently in sinus rhythm with history of recent cardioversion on 06/26/2019. -Continue Eliquis.  Hypertension.  Blood pressure mildly elevated. --Keep monitoring.  CKD.  Creatinine and BUN worsening, creatinine at 2.16 today We will hold Lasix and give her gentle fluids. -Continue monitoring renal function. -Avoid nephrotoxic.  Type 2 diabetes.  A1c 7.3 on 07/19/2019. -Continue SSI.  Hypothyroidism.  TSH 0.838 on 07/04/2019. -Continue home dose of Synthroid.  OSA.   On  nocturnal CPAP.  Objective: Vitals:   07/21/19 0500 07/21/19 0625 07/21/19 0828 07/21/19 0831  BP: 94/67 (!) 142/110    Pulse: (!) 54 (!) 59 (!) 52   Resp: 18 (!) 26 15   Temp:    (!) 97.5 F (36.4 C)  TempSrc:    Axillary  SpO2: 92% 96% 93%   Weight:      Height:        Intake/Output Summary (Last 24 hours) at 07/21/2019 0916 Last data filed at 07/21/2019 0600 Gross per 24 hour  Intake 546.04 ml  Output 2350 ml  Net -1803.96 ml   Filed Weights   07/20/19 0013  Weight: 105.7 kg    Examination:  General exam: Appears calm and comfortable on BiPAP. Respiratory system: Better air entry with few basal crackles. Respiratory effort normal. Cardiovascular system: S1 & S2 heard, RRR. No JVD, murmurs, rubs, gallops or clicks. Trace pedal edema. Gastrointestinal system: Abdomen is nondistended, soft and nontender. No organomegaly or masses felt. Normal bowel sounds heard. Central nervous system: Alert and oriented. No focal neurological deficits. Extremities: No cyanosis, pulses intact and symmetrical. Skin: No rashes, lesions or ulcers Psychiatry: Judgement and insight appear normal. Mood & affect appropriate.    DVT prophylaxis: Eliquis Code Status: DNR Family Communication: No family at bedside Disposition Plan: Pending improvement.  Consultants:   PCCM  Oncology  Procedures:   Antimicrobials:  Cefepime day 3 Vancomycin day 3 Azithromycin day 3  Data Reviewed: I have personally reviewed following labs and imaging studies  CBC: Recent Labs  Lab 07/17/19 0528 07/18/19 0418 07/19/19 1615 07/20/19 0145 07/21/19 0243  WBC 10.3 11.5* 15.4* 11.0* 11.1*  NEUTROABS  --   --  14.0*  --   --   HGB 8.7* 8.5* 10.3* 8.1* 8.4*  HCT 29.3* 27.7* 34.3* 27.1* 28.0*  MCV 94.8 94.2 95.8 97.5 97.6  PLT 155 143* 224 156 462   Basic Metabolic Panel: Recent Labs  Lab 07/17/19 0528 07/18/19 0418 07/19/19 1615 07/20/19 0145 07/21/19 0243  NA 143 141 140 137 147*   K 4.9 5.6* 4.8 4.8 4.9  CL 95* 95* 91* 90* 97*  CO2 39* 35* 40* 36* 37*  GLUCOSE 117* 165* 185* 268* 280*  BUN 114* 112* 103* 102* 105*  CREATININE 2.12* 1.99* 2.16* 1.94* 2.16*  CALCIUM 11.0* 10.6* 10.6* 9.1 9.7  MG  --   --   --  2.3  --   PHOS  --   --   --  5.3* 5.0*   GFR: Estimated Creatinine Clearance: 22.9 mL/min (A) (by C-G formula based on SCr of 2.16 mg/dL (H)). Liver Function Tests: Recent Labs  Lab 07/20/19 0145 07/21/19 0243  AST 19  --   ALT 18  --   ALKPHOS 38  --   BILITOT 1.0  --   PROT 6.0*  --   ALBUMIN 3.2* 3.2*   No results for input(s): LIPASE, AMYLASE in the last 168 hours. No results for input(s): AMMONIA in the last 168 hours. Coagulation Profile: No results for input(s): INR, PROTIME in the last 168 hours. Cardiac Enzymes: No results for input(s): CKTOTAL, CKMB, CKMBINDEX, TROPONINI in the last 168 hours. BNP (last 3 results) No results for input(s): PROBNP in the last 8760 hours. HbA1C: Recent Labs    07/19/19 2131  HGBA1C 7.3*   CBG: Recent Labs  Lab 07/20/19 1557 07/20/19 2048 07/21/19 0014 07/21/19 0332 07/21/19 0742  GLUCAP 273* 351* 286* 250* 291*   Lipid Profile:  No results for input(s): CHOL, HDL, LDLCALC, TRIG, CHOLHDL, LDLDIRECT in the last 72 hours. Thyroid Function Tests: Recent Labs    07/20/19 0145  TSH 0.355   Anemia Panel: No results for input(s): VITAMINB12, FOLATE, FERRITIN, TIBC, IRON, RETICCTPCT in the last 72 hours. Sepsis Labs: Recent Labs  Lab 07/19/19 2214  PROCALCITON <0.10    Recent Results (from the past 240 hour(s))  Urine Culture     Status: None   Collection Time: 07/11/19 11:30 AM   Specimen: Urine, Catheterized  Result Value Ref Range Status   Specimen Description URINE, CATHETERIZED  Final   Special Requests NONE  Final   Culture   Final    NO GROWTH Performed at [facility], [address] [place], [place] [address]    Report Status 07/12/2019 FINAL  Final  SARS  CORONAVIRUS 2 (TAT 6-24 HRS) Nasopharyngeal Nasopharyngeal Swab     Status: None   Collection Time: 07/18/19 10:11 AM   Specimen: Nasopharyngeal Swab  Result Value Ref Range Status   SARS Coronavirus 2 NEGATIVE NEGATIVE Final    Comment: (NOTE) SARS-CoV-2 target nucleic acids are NOT DETECTED. The SARS-CoV-2 RNA is generally detectable in upper and lower respiratory specimens during the acute phase of infection. Negative results do not preclude SARS-CoV-2 infection, do not rule out co-infections with other pathogens, and should not be used as the sole basis for treatment or other patient management decisions. Negative results must be combined with clinical observations, patient history, and epidemiological information. The expected result is Negative. Fact Sheet for Patients: SugarRoll.be Fact Sheet for Healthcare Providers: https://www.woods-mathews.com/ This test is not yet approved or cleared by the [place] FDA and  has been authorized for detection and/or diagnosis of SARS-CoV-2 by FDA under an Emergency Use Authorization (EUA). This EUA will remain  in effect (meaning this test can be used) for the duration of the COVID-19 declaration under Section 56 4(b)(1) of the Act, 21 U.S.C. section 360bbb-3(b)(1), unless the authorization is terminated or revoked sooner. Performed at [facility], [place] [address] [place], [place] [address]   MRSA PCR Screening     Status: Abnormal   Collection Time: 07/19/19  9:31 PM   Specimen: Nasal Mucosa; Nasopharyngeal  Result Value Ref Range Status   MRSA by PCR POSITIVE (A) NEGATIVE Final    Comment:        The GeneXpert MRSA Assay (FDA approved for NASAL specimens only), is one component of a comprehensive MRSA colonization surveillance program. It is not intended to diagnose MRSA infection nor to guide or monitor treatment for MRSA infections. RESULT CALLED TO, READ BACK BY AND  VERIFIED WITH: ROTHERMEL,A @ [id number] UM [id number] BY POTEAT,S Performed at [facility], [place] [address], [place],  [address]      Radiology Studies: Dg Chest 2 View  Result Date: 07/19/2019 CLINICAL DATA:  Shortness of breath, hypoxia EXAM: CHEST - 2 VIEW COMPARISON:  07/10/2019 FINDINGS: Cardiomegaly, grossly stable. Calcific aortic knob. There are diffuse bilateral hazy airspace opacities, progressed from prior. Small bilateral pleural effusions. No pneumothorax. IMPRESSION: 1. Diffuse bilateral hazy airspace opacities have progressed since prior. Findings may represent pulmonary edema/ARDS versus multifocal pneumonia. 2. Small bilateral pleural effusions. Electronically Signed   By: Davina Poke M.D.   On: 07/19/2019 15:50    Scheduled Meds: . apixaban  2.5 mg Oral BID  . atorvastatin  40 mg Oral QHS  . chlorhexidine  15 mL Mouth Rinse BID  .  Chlorhexidine Gluconate Cloth  6 each Topical Daily  . feeding supplement (GLUCERNA SHAKE)  237 mL Oral TID BM  . feeding supplement (PRO-STAT SUGAR FREE 64)  30 mL Oral BID  . fenofibrate  54 mg Oral Daily  . insulin aspart  0-9 Units Subcutaneous Q4H  . insulin glargine  40 Units Subcutaneous QHS  . levothyroxine  112 mcg Oral QAC breakfast  . mouth rinse  15 mL Mouth Rinse q12n4p  . methylPREDNISolone (SOLU-MEDROL) injection  125 mg Intravenous Q6H  . multivitamin with minerals  1 tablet Oral Daily  . mupirocin ointment  1 application Nasal BID  . pantoprazole  40 mg Oral BID AC  . sertraline  100 mg Oral QPM  . sodium chloride flush  3 mL Intravenous Q12H  . vancomycin variable dose per unstable renal function (pharmacist dosing)   Does not apply See admin instructions   Continuous Infusions: . sodium chloride    . sodium chloride    . azithromycin Stopped (07/20/19 2220)  . ceFEPime (MAXIPIME) IV Stopped (07/20/19 2147)     LOS: 2 days   Time spent: 40 minutes  Lorella Nimrod, MD [facility]  Pager [phone]  If 7PM-7AM, please contact night-coverage [email or web address] Password [email or web address] 07/21/2019, 9:16 AM

## 2019-07-21 NOTE — Progress Notes (Signed)
Notified MD of BP 170s/50s and HR of 57. See MAR for new orders

## 2019-07-21 NOTE — Progress Notes (Addendum)
Pharmacy Antibiotic Note  [name] is a 84 y.o. female admitted on 07/19/2019 with pneumonia.  Pharmacy has been consulted for cefepime and vancomycin dosing.  Plan:  Cefepime 2 Gm IV q24h  Zmax 500 mg IV q24h (MD)  Vancomycin 2 Gm x1 given on 11/11, Will obtain vancomycin tonight at 2200 in light of worsening renal function to determine need for another dose. Will hold off from starting a schedule dose due to  worsening renal function   F/u scr/VR/culutures  VR = 14 (~ 48 hours 2 Gm load)  Give Vancomycin 1 Gm x1 11/14 0400- f/u VR for additional doses  Height: 5\' 1"  (154.9 cm) Weight: 233 lb 0.4 oz (105.7 kg) IBW/kg (Calculated) : 47.8  Temp (24hrs), Avg:97.4 F (36.3 C), Min:97 F (36.1 C), Max:97.9 F (36.6 C)  Recent Labs  Lab 07/17/19 0528 07/18/19 0418 07/19/19 1615 07/20/19 0145 07/21/19 0243 07/21/19 2205  WBC 10.3 11.5* 15.4* 11.0* 11.1*  --   CREATININE 2.12* 1.99* 2.16* 1.94* 2.16*  --   VANCORANDOM  --   --   --   --   --  14    Estimated Creatinine Clearance: 22.9 mL/min (A) (by C-G formula based on SCr of 2.16 mg/dL (H)).    Allergies  Allergen Reactions  . Adhesive [Tape] Other (See Comments)    PULLS OFF THE SKIN  . Apixaban Other (See Comments)    Internal Bleeding  . Aspirin Itching, Rash, Hives and Swelling    Swelling of her tongue  . Mirabegron Other (See Comments)    Patient experienced A-Fib  . Pineapple Anaphylaxis and Swelling    Throat swells and blisters on tongue and roof of mouth per patient  . Metformin Diarrhea and Nausea Only  . Tetracycline Hives  . Fluticasone-Salmeterol Itching and Rash  . Iodinated Diagnostic Agents Itching and Rash       . Lactose Intolerance (Gi) Other (See Comments)    Gas   . Latex Itching, Rash and Other (See Comments)    Pulls off the skin and causes welts  . Lisinopril Cough  . Metronidazole Other (See Comments)    Reaction not known  . Sulfa Antibiotics Rash  . Sulfonamide  Derivatives Itching and Rash    Antimicrobials this admission: 11/11 cefepime >>  11/11 zmax >>  11/11 vancomycin >>  Dose adjustments this admission:   Microbiology results: 11/10 COVID-19: negative  11/11 MRSA PCR: positive 11/11: strep pneumo antigen: negative 11/11: Legionella antigen:   Thank you for allowing pharmacy to be a part of this patient's care.  Dorrene German 07/21/2019 10:54 PM

## 2019-07-21 NOTE — Progress Notes (Addendum)
   NAME:  [name], MRN:  [id number], DOB:  July 29, 1935, LOS: 2 ADMISSION DATE:  07/19/2019, CONSULTATION DATE:  11/12 REFERRING MD:  Roel Cluck, CHIEF COMPLAINT:  Recurrent respiratory failure   Brief History   This 84 y/o female was admitted on 11/11 with recurrent hypoxemic/hypercarbic respiratory failure requiring Bipap support.   Past Medical History  CKD Stage 3, Anemia of chronic disease,Atrial fibrillation on Elaquis, s/p cardioversion,OSA on CPAP,HFpEF ,HLD,Type II DM HTN,Hypothyroidism,Chronic ITP,PE 2010  Significant Hospital Events     Consults:  11/12 >> PCCM  Procedures:    Significant Diagnostic Tests:  11/11 PCXR >>  Stable cardiomegaly, diffuse bilateral opacities much progressed from prior CXR 11/2, small bilateral pleural effusions    10/27 CT Chest >> Widespread ground-glass opacities, small bilat pleural effusions Micro Data:  11/10 >> Sars-CoV2 neg 11/10 >> MRSA PCR pos  Antimicrobials:  11/11 Vanco 2 gm x 1 11/11 Cefepime >> day 2 11/11 Azithromycin >> day 2  Interim history/subjective:  Looks more awake she is comfortable on BiPAP  Objective   Blood pressure (Abnormal) 142/110, pulse (Abnormal) 52, temperature (Abnormal) 97.5 F (36.4 C), temperature source Axillary, resp. rate 15, height 5\' 1"  (1.549 m), weight 105.7 kg, SpO2 93 %.        Intake/Output Summary (Last 24 hours) at 07/21/2019 0949 Last data filed at 07/21/2019 0600 Gross per 24 hour  Intake 546.04 ml  Output 2350 ml  Net -1803.96 ml  sats 94% on 50% Filed Weights   07/20/19 0013  Weight: 105.7 kg    Examination: General 84 year old chronically ill-appearing obese white female she is resting in bed and currently in no acute distress HEENT normocephalic face mask in place no JVD Pulmonary: Diminished throughout, no accessory use appears comfortable on NIPPV Cardiac: Regular rate and rhythm Abdomen: Obese Extremities warm and dry Neuro: Appropriate follows commands  GU clear yellow  Resolved Hospital Problem list     Assessment & Plan:   Recurrent acute on chronic hypoxic respiratory failure Diffuse pulmonary infiltrates of un-clear etiology PAF (cardioverted and in NSR since Oct 2020) CRI (stage III) Mild systolic (EF 56-43%) and grade I diastolic HF Obesity   OSA H/o ITP ( ROMIPLOSTIM & FERAHEME) Chronic anemia  DM  Pulmonary problem list:  Recurrent acute on chronic hypoxemic  respiratory failure in setting of diffuse patchy infiltrates.  Ddx: seemingly pneumonitis ? Drug related??, pulmonary edema, ALI.  Favoring pneumonitis/COP picture -already on max treatment  PLAN: Continue empiric antibiotics, She is now on day #3 azithromycin, cefepime and vancomycin Hold Lasix today as serum creatinine elevated He is on high-dose methylprednisone, 125 mg every 6 hours, would continue this throughout today and start stepping back over the weekend.  She will need a slow taper Wean oxygen BiPAP as needed   Not really anything else to offer here. May not survive.  Critical care time: NA     Erick Colace ACNP-BC [place] Pager # [phone] OR # [phone] if no answer

## 2019-07-22 ENCOUNTER — Inpatient Hospital Stay (HOSPITAL_COMMUNITY): Payer: Medicare Other

## 2019-07-22 LAB — BASIC METABOLIC PANEL
Anion gap: 12 (ref 5–15)
BUN: 93 mg/dL — ABNORMAL HIGH (ref 8–23)
CO2: 37 mmol/L — ABNORMAL HIGH (ref 22–32)
Calcium: 9.8 mg/dL (ref 8.9–10.3)
Chloride: 101 mmol/L (ref 98–111)
Creatinine, Ser: 2.29 mg/dL — ABNORMAL HIGH (ref 0.44–1.00)
GFR calc Af Amer: 22 mL/min — ABNORMAL LOW (ref >60)
GFR calc non Af Amer: 19 mL/min — ABNORMAL LOW (ref >60)
Glucose, Bld: 290 mg/dL — ABNORMAL HIGH (ref 70–99)
Potassium: 4.5 mmol/L (ref 3.5–5.1)
Sodium: 150 mmol/L — ABNORMAL HIGH (ref 135–145)

## 2019-07-22 LAB — GLUCOSE, CAPILLARY
Glucose-Capillary: 250 mg/dL — ABNORMAL HIGH (ref 70–99)
Glucose-Capillary: 230 mg/dL — ABNORMAL HIGH (ref 70–99)
Glucose-Capillary: 304 mg/dL — ABNORMAL HIGH (ref 70–99)
Glucose-Capillary: 362 mg/dL — ABNORMAL HIGH (ref 70–99)
Glucose-Capillary: 265 mg/dL — ABNORMAL HIGH (ref 70–99)
Glucose-Capillary: 251 mg/dL — ABNORMAL HIGH (ref 70–99)

## 2019-07-22 LAB — CBC
HCT: 30 % — ABNORMAL LOW (ref 36.0–46.0)
Hemoglobin: 8.8 g/dL — ABNORMAL LOW (ref 12.0–15.0)
MCH: 29.1 pg (ref 26.0–34.0)
MCHC: 29.3 g/dL — ABNORMAL LOW (ref 30.0–36.0)
MCV: 99.3 fL (ref 80.0–100.0)
Platelets: 214 10*3/uL (ref 150–400)
RBC: 3.02 MIL/uL — ABNORMAL LOW (ref 3.87–5.11)
RDW: 20.1 % — ABNORMAL HIGH (ref 11.5–15.5)
WBC: 14.5 10*3/uL — ABNORMAL HIGH (ref 4.0–10.5)
nRBC: 0.1 % (ref 0.0–0.2)

## 2019-07-22 LAB — PROCALCITONIN: Procalcitonin: <0.1 ng/mL

## 2019-07-22 LAB — BRAIN NATRIURETIC PEPTIDE: B Natriuretic Peptide: 346.4 pg/mL — ABNORMAL HIGH (ref 0.0–100.0)

## 2019-07-22 MED ORDER — INSULIN ASPART 100 UNIT/ML ~~LOC~~ SOLN
0.0000 [IU] | SUBCUTANEOUS | Status: DC
  Administered 2019-07-22: 11 [IU] via SUBCUTANEOUS
  Administered 2019-07-22: 20 [IU] via SUBCUTANEOUS
  Administered 2019-07-22: 11 [IU] via SUBCUTANEOUS
  Administered 2019-07-23 (×2): 7 [IU] via SUBCUTANEOUS
  Administered 2019-07-23 (×2): 11 [IU] via SUBCUTANEOUS
  Administered 2019-07-23: 3 [IU] via SUBCUTANEOUS
  Administered 2019-07-23: 4 [IU] via SUBCUTANEOUS
  Administered 2019-07-24: 11 [IU] via SUBCUTANEOUS
  Administered 2019-07-24: 15 [IU] via SUBCUTANEOUS
  Administered 2019-07-24 – 2019-07-25 (×3): 4 [IU] via SUBCUTANEOUS
  Administered 2019-07-25: 7 [IU] via SUBCUTANEOUS
  Administered 2019-07-25: 4 [IU] via SUBCUTANEOUS
  Administered 2019-07-25: 7 [IU] via SUBCUTANEOUS
  Administered 2019-07-25 – 2019-07-26 (×2): 3 [IU] via SUBCUTANEOUS
  Administered 2019-07-26: 7 [IU] via SUBCUTANEOUS
  Administered 2019-07-26 (×2): 11 [IU] via SUBCUTANEOUS
  Administered 2019-07-27: 15 [IU] via SUBCUTANEOUS
  Administered 2019-07-27: 7 [IU] via SUBCUTANEOUS
  Administered 2019-07-27: 4 [IU] via SUBCUTANEOUS
  Administered 2019-07-27: 20 [IU] via SUBCUTANEOUS
  Administered 2019-07-27: 15 [IU] via SUBCUTANEOUS
  Administered 2019-07-27: 7 [IU] via SUBCUTANEOUS
  Administered 2019-07-28 (×3): 4 [IU] via SUBCUTANEOUS
  Administered 2019-07-28: 7 [IU] via SUBCUTANEOUS
  Administered 2019-07-28: 3 [IU] via SUBCUTANEOUS
  Administered 2019-07-29: 7 [IU] via SUBCUTANEOUS
  Administered 2019-07-29: 4 [IU] via SUBCUTANEOUS
  Administered 2019-07-29: 3 [IU] via SUBCUTANEOUS
  Administered 2019-07-29: 15 [IU] via SUBCUTANEOUS
  Administered 2019-07-29: 4 [IU] via SUBCUTANEOUS
  Administered 2019-07-29: 15 [IU] via SUBCUTANEOUS
  Administered 2019-07-30 (×2): 4 [IU] via SUBCUTANEOUS
  Administered 2019-07-30: 3 [IU] via SUBCUTANEOUS
  Administered 2019-07-30: 4 [IU] via SUBCUTANEOUS
  Administered 2019-07-30: 3 [IU] via SUBCUTANEOUS
  Administered 2019-07-31 (×2): 4 [IU] via SUBCUTANEOUS
  Administered 2019-07-31: 7 [IU] via SUBCUTANEOUS
  Administered 2019-07-31 (×3): 4 [IU] via SUBCUTANEOUS
  Administered 2019-08-01: 11 [IU] via SUBCUTANEOUS
  Administered 2019-08-01: 3 [IU] via SUBCUTANEOUS
  Administered 2019-08-01: 7 [IU] via SUBCUTANEOUS
  Administered 2019-08-02: 4 [IU] via SUBCUTANEOUS
  Administered 2019-08-02: 11 [IU] via SUBCUTANEOUS
  Administered 2019-08-02: 7 [IU] via SUBCUTANEOUS
  Administered 2019-08-02: 4 [IU] via SUBCUTANEOUS
  Administered 2019-08-03 (×2): 3 [IU] via SUBCUTANEOUS
  Administered 2019-08-03: 11 [IU] via SUBCUTANEOUS
  Administered 2019-08-03 – 2019-08-04 (×3): 3 [IU] via SUBCUTANEOUS
  Administered 2019-08-04: 4 [IU] via SUBCUTANEOUS
  Administered 2019-08-04: 11 [IU] via SUBCUTANEOUS
  Administered 2019-08-04: 4 [IU] via SUBCUTANEOUS
  Administered 2019-08-05: 7 [IU] via SUBCUTANEOUS
  Administered 2019-08-05: 11 [IU] via SUBCUTANEOUS

## 2019-07-22 MED ORDER — SODIUM CHLORIDE 0.45 % IV SOLN
INTRAVENOUS | Status: AC
  Administered 2019-07-22 (×2): via INTRAVENOUS

## 2019-07-22 MED ORDER — METHYLPREDNISOLONE SODIUM SUCC 125 MG IJ SOLR
80.0000 mg | Freq: Two times a day (BID) | INTRAMUSCULAR | Status: DC
  Administered 2019-07-22 – 2019-07-28 (×12): 80 mg via INTRAVENOUS
  Filled 2019-07-22 (×12): qty 2

## 2019-07-22 MED ORDER — SODIUM CHLORIDE 0.45 % IV SOLN
INTRAVENOUS | Status: DC
  Administered 2019-07-22: 09:00:00 via INTRAVENOUS

## 2019-07-22 MED ORDER — HYDRALAZINE HCL 20 MG/ML IJ SOLN
5.0000 mg | Freq: Four times a day (QID) | INTRAMUSCULAR | Status: DC | PRN
  Administered 2019-07-22 – 2019-07-24 (×3): 5 mg via INTRAVENOUS
  Filled 2019-07-22 (×3): qty 1

## 2019-07-22 NOTE — Progress Notes (Signed)
NAME:  [name], MRN:  [id number], DOB:  [birth year], LOS: 3 ADMISSION DATE:  07/19/2019, CONSULTATION DATE:  11/12 REFERRING MD:  Roel Cluck, CHIEF COMPLAINT:  Recurrent respiratory failure   Brief History   This 84 y/o female was admitted on 11/11 with recurrent hypoxemic/hypercarbic respiratory failure requiring Bipap support.   Past Medical History  CKD Stage 3, Anemia of chronic disease,Atrial fibrillation on Elaquis, s/p cardioversion,OSA on CPAP,HFpEF ,HLD,Type II DM HTN,Hypothyroidism,Chronic ITP,PE 2010  Significant Hospital Events   Stopped eliquis 11/13 due to concern with hemoptysis report and ? alv hem on  Consults:  11/12 >> PCCM  Procedures:    Significant Diagnostic Tests:   10/27 CT Chest >> Widespread ground-glass opacities, small bilat pleural effusions   Micro Data:  11/10 >> Sars-CoV2 neg 11/10 >> MRSA PCR pos 11/11  Urine strep neg 11/11  Urine legionella neg   Antimicrobials:  11/11 Vanco   11/11 Cefepime   11/11 Azithromycin   Interim history/subjective:  Fist time off bipap this am looks better  Objective   Blood pressure (!) 194/45, pulse 63, temperature (!) 97.5 F (36.4 C), temperature source Axillary, resp. rate (!) 28, height _0  (1.549 m), weight 105.7 kg, SpO2 (!) 87 %.    FiO2 (%):  [50 %-100 %] 50 %   Intake/Output Summary (Last 24 hours) at 07/22/2019 1124 Last data filed at 07/22/2019 1000 Gross per 24 hour  Intake 835.03 ml  Output 1350 ml  Net -514.97 ml  sats 94% on 50% Filed Weights   07/20/19 0013  Weight: 105.7 kg    Examination:  Obese wf nad off bipap at 45 degrees hob No jvd Oropharynx clear,  mucosa nl Neck supple Lungs with minimal insp /exp rhonchi bilaterally RRR no s3 or or sign murmur Abd obese with limited excursion  Extr warm with no edema or clubbing noted Neuro     no apparent motor deficits   Lab Results  Component Value Date   CREATININE 2.29 (H) 07/22/2019   CREATININE 2.16 (H)  07/21/2019   CREATININE 1.94 (H) 07/20/2019   CREATININE 2.56 (H) 05/25/2019   CREATININE 2.51 (H) 05/11/2019   CREATININE 2.46 (H) 05/04/2019   CREATININE 1.9 (H) 09/08/2017   CREATININE 2.2 (H) 08/19/2017   CREATININE 2.3 (H) 07/28/2017   CREATININE 1.8 (H) 02/25/2017   CREATININE 1.7 (H) 01/18/2017       Lab Results  Component Value Date   HGB 8.8 (L) 07/22/2019   HGB 8.4 (L) 07/21/2019   HGB 8.1 (L) 07/20/2019   HGB 9.3 (L) 06/30/2019   HGB 9.7 (L) 06/22/2019   HGB 10.2 (L) 06/15/2019   HGB 11.0 (L) 09/08/2017   HGB 10.6 (L) 09/03/2017   HGB 10.6 (L) 08/25/2017     Resolved Hospital Problem list     Assessment & Plan:   Recurrent acute on chronic hypoxic respiratory failure Diffuse pulmonary infiltrates of un-clear etiology PAF (cardioverted and in NSR since Oct 2020) CRI (stage III) Mild systolic (EF 81-27%) and grade I diastolic HF Obesity   OSA H/o ITP ( ROMIPLOSTIM & FERAHEME) Chronic anemia  DM  Pulmonary problem list:  Recurrent acute on chronic hypoxemic  respiratory failure in setting of diffuse patchy infiltrates.   ddx :  Miscellaneous:Alv microlithiasis, alv proteinosis, asp, bronchiectais    ARDS/ AIP Occupational dz/ HSP Neoplasm Infection w/u in progress Drug  - no record of any of the usual suspects (chemo, amiodarone/nitrofurantoin)  Pulmonary emboli, Protein disorders Edema/Eosinophilic dz Sarcoidosis  Connective tissue dz/ BOOP > esr 120 prior to steroids suports  Hist X / Hemorrhage on eliquis > d/c'd 11/13  Idiopathic    PLAN: Wean 02 as tol  Reduce solumedrol to 80 mg IV q 12  Continue abx for now   Christinia Gully, MD Pulmonary and [place] [phone] After 5:30 PM or weekends, use [facility] [phone]

## 2019-07-22 NOTE — Progress Notes (Signed)
Discussed with MD course of treatment for pt. MD Reesa Chew requested that both daughters be able to come to bedside. Confirmed with AC and Charge RN that having both daughters at bedside would be approved dt poor outlook for patient  [name] paged as per requested by family

## 2019-07-22 NOTE — Progress Notes (Signed)
Notified MD in regards to high BP. Hydralazine PRN ordered and given. Will continue to monitor

## 2019-07-22 NOTE — Progress Notes (Signed)
[name] is about the same this morning.  She looks pretty stable.  Her oxygen saturations are 92%.  I am sure she still desaturates.  Her blood sugars were quite high because of the steroids.  Her platelet count is 214,000.  She is doing quite well with there thrombocytopenia.  Her hemoglobin is 8.8.  She is afebrile.  Her blood pressure is 167/54.  It is hard to say how much she is really eating.  There is no obvious nausea or vomiting.  I doubt that she is having any kind of diarrhea.  Her lungs sound pretty clear bilaterally.  I do not hear any type of wheezes.  She has decent breath sounds bilaterally.  Cardiac exam is regular rate and rhythm.  I know that she is getting fantastic care from all the staff in the ICU.  I know everything is being done to try to help her lungs.  [name], MD  Psalm 56:4

## 2019-07-22 NOTE — Progress Notes (Signed)
Rt took pt off BIPAP and placed on 10 LPM HF [place]. No distress noted at this time.

## 2019-07-22 NOTE — Progress Notes (Signed)
Pt daughter, [name], expressed concerns about her mother's condition. States at home her mother is ambulatory, mostly independent with ADLs, and does not wear oxygen. Daughter feels she is seeing her mother get worse over the course of time. MD paged to request if she could call daughter and discuss course of treatment

## 2019-07-22 NOTE — Progress Notes (Addendum)
Pt was awake and on the BiPAP this time. Her daughters were bedside holding her hands. She wanted prayer again; they were appreciative of follow-up visit. Please page if assistance is needed.  [place], [place]

## 2019-07-22 NOTE — Progress Notes (Signed)
PROGRESS NOTE    [name]  KZS:[id number] DOB: 1934/11/16 DOA: 07/19/2019 PCP: Javier Glazier, MD   Brief Narrative:  [name] a 84 y.o.femalewith medical history significant of chronic ITP, CKD stage3, anemia of chronic disease, A.fib on Eliquis,sleep apnea on CPAP history of CHF HLD history of pulmonary embolism in 2010 diabetes type 2, HTN,hypothyroidism, pressure ulcer Presented withworsening shortness of breathfromSNF. Multiple recent admissions in October, most recentlyshe was discharged to SNF 1 day ago from [facility] where she was treated for acute respiratory failure and thought to be due to pneumonitis and was sent home with tapering dose of steroid. She was having increased oxygen requirement, initially managed with nonrebreather. BiPAP was started later due to worsening respiratory status. Chest x-ray with worsening of bilateral infiltrate, broader differential which includes pneumonitis, bilateral pneumonia, ARDS, pulmonary vascular congestion. She was started on broad-spectrum antibiotics,Lasix and steroids. PCCM is on board and does not think that there is much to offer for her.  Subjective: Patient was not feeling well when seen this morning.  She continued to experience short of breath.  Saturating in high 80s on nonbreather.  She is feeling weak.  Had a long discussion with daughter [name] and with [name] who stays on phone.  They both were concerned that their mom is dying and wanted to be with her at this time.  We get permission for [name] to be with her along with her sister.  They both will discuss among themselves and also wants to involve palliative about future scope of care.  Assessment & Plan:   Active Problems:   Hypothyroidism   HYPERCHOLESTEROLEMIA   OBESITY   Essential hypertension   Acute respiratory failure with hypoxia (HCC)   Chronic kidney disease (CKD) stage G4/A1, severely decreased glomerular filtration rate  (GFR) between 15-29 mL/min/1.73 square meter and albuminuria creatinine ratio less than 30 mg/g (HCC)   Chronic kidney disease   Chronic ITP (idiopathic thrombocytopenia) (HCC)   Normocytic anemia   Atypical atrial flutter (HCC)   HCAP (healthcare-associated pneumonia)   Pneumonitis  Acute on chronic hypoxic and hypercapnic respiratory failure. Differential continue to remain broad as there is no definitive diagnosis at this time. ARDS/pneumonitis/aspiration/pulmonary edema/PE. COVID-19 infection unlikely as negative PCR and no fever. PE unlikely as patient is on DOAC. Aspiration/bilateral pneumonia can be a possibility although patient is afebrile-we will continue broad-spectrum antibiotics with cefepime, vancomycin and azithromycin. BNP was not very impressive for heart failure,not much response to diuresis. ESR is not very impressive either but patient was on steroid.  She had one episode of small amount of hemoptysis yesterday. -Patient was on high-dose steroids which was decreased to Solu-Medrol 80 mg twice daily by PCCM today. -I will check procalcitonin regarding continuation of antibiotics, really do not think that this is infectious. -Repeat chest x-ray today as we were holding diuretics and giving her gentle fluids due to worsening hypernatremia and renal function.  Patient appears dry. -Palliative consult. -Very difficult situation, apparently poor prognosis.  Discussed in detail with both daughters 1 in person brother on phone, they wants to take some time to make further decision regarding her goals of care.  Hypertension.  Blood pressure continued to rise. -Amlodipine was added yesterday. -Hydralazine 5 mg as needed for blood pressure above 180.  AKI with CKD.  Worsening renal function, more consistent with prerenal. -Holding diuretic at this time and giving her some gentle fluids. -Continue monitoring renal function. -Avoid nephrotoxic.  Hypernatremia.  Sodium 150  today  with increasing BUN.  Most likely secondary to poor p.o. intake as patient remained mostly on BiPAP. -Gentle IV hydration. -Monitor electrolytes.  Diastolic heart failure.  BNP on admission was very not very impressive. Clinically appears dry.  Having good urinary output without diuresis with negative balance.  Daily weights are not available. -Try getting daily weights. -Recheck BNP. -Diurese if needed. -Continue strict intake and output.  Chronic ITP.Patient recently received 2 doses of Nplate and Aranesp during previous hospitalization. Under care of Dr. Marin Olp. Oncology is following-we appreciate their recommendations. Platelets stable currently. -Solu-Medrol dose was decreased to 80 mg twice daily today.  Leukocytosis.  Most likely secondary to steroid.  Remained afebrile.  Type 2 diabetes. A1c 7.3 on 07/19/2019. Remains hyperglycemic due to high-dose steroids. -Convert SSI to resistant scale. -Keep monitoring.  Atypical atrial flutter.Currently in sinus rhythm with history of recent cardioversion on 06/26/2019. -Eliquis was discontinued yesterday due to hemoptysis.  Hypothyroidism. TSH 0.838 on 07/04/2019. -Continue home dose of Synthroid.  OSA.On nocturnal CPAP.  Objective: Vitals:   07/22/19 1000 07/22/19 1100 07/22/19 1247 07/22/19 1254  BP: (!) 181/55 (!) 194/45    Pulse: 62 63  70  Resp: 17 (!) 28  (!) 26  Temp:   (!) 97.5 F (36.4 C)   TempSrc:   Oral   SpO2: (!) 89% (!) 87%  (!) 88%  Weight:      Height:        Intake/Output Summary (Last 24 hours) at 07/22/2019 1339 Last data filed at 07/22/2019 1000 Gross per 24 hour  Intake 835.03 ml  Output 1350 ml  Net -514.97 ml   Filed Weights   07/20/19 0013  Weight: 105.7 kg    Examination:  General exam: Chronically ill-appearing, obese lady, appears little anxious. Respiratory system: Decreased breath sound at bases, using some accessory muscles. Cardiovascular system: S1 & S2 heard, RRR.  No JVD, murmurs, rubs, gallops or clicks. No pedal edema. Gastrointestinal system: Abdomen is nondistended, soft and nontender.  Normal bowel sounds heard. Central nervous system: Alert and oriented. No focal neurological deficits. Extremities: Symmetric 5 x 5 power. Psychiatry: Judgement and insight appear normal. Mood & affect appropriate.   DVT prophylaxis: SCDs Code Status: DNR Family Communication: Both daughters were updated one in person and other on phone. Disposition Plan: Pending improvement/goals of care discussion. Prognosis seems poor.  Consultants:   PCCM  Oncology  Procedures:   Antimicrobials:  Cefepime day 4 Vancomycin day 4 Azithromycin day 4 Data Reviewed: I have personally reviewed following labs and imaging studies  CBC: Recent Labs  Lab 07/18/19 0418 07/19/19 1615 07/20/19 0145 07/21/19 0243 07/22/19 0204  WBC 11.5* 15.4* 11.0* 11.1* 14.5*  NEUTROABS  --  14.0*  --   --   --   HGB 8.5* 10.3* 8.1* 8.4* 8.8*  HCT 27.7* 34.3* 27.1* 28.0* 30.0*  MCV 94.2 95.8 97.5 97.6 99.3  PLT 143* 224 156 187 956   Basic Metabolic Panel: Recent Labs  Lab 07/18/19 0418 07/19/19 1615 07/20/19 0145 07/21/19 0243 07/22/19 0204  NA 141 140 137 147* 150*  K 5.6* 4.8 4.8 4.9 4.5  CL 95* 91* 90* 97* 101  CO2 35* 40* 36* 37* 37*  GLUCOSE 165* 185* 268* 280* 290*  BUN 112* 103* 102* 105* 93*  CREATININE 1.99* 2.16* 1.94* 2.16* 2.29*  CALCIUM 10.6* 10.6* 9.1 9.7 9.8  MG  --   --  2.3  --   --   PHOS  --   --  5.3* 5.0*  --    GFR: Estimated Creatinine Clearance: 21.6 mL/min (A) (by C-G formula based on SCr of 2.29 mg/dL (H)). Liver Function Tests: Recent Labs  Lab 07/20/19 0145 07/21/19 0243  AST 19  --   ALT 18  --   ALKPHOS 38  --   BILITOT 1.0  --   PROT 6.0*  --   ALBUMIN 3.2* 3.2*   No results for input(s): LIPASE, AMYLASE in the last 168 hours. No results for input(s): AMMONIA in the last 168 hours. Coagulation Profile: No results for  input(s): INR, PROTIME in the last 168 hours. Cardiac Enzymes: No results for input(s): CKTOTAL, CKMB, CKMBINDEX, TROPONINI in the last 168 hours. BNP (last 3 results) No results for input(s): PROBNP in the last 8760 hours. HbA1C: Recent Labs    07/19/19 2131  HGBA1C 7.3*   CBG: Recent Labs  Lab 07/21/19 1941 07/21/19 2338 07/22/19 0326 07/22/19 0804 07/22/19 1156  GLUCAP 324* 324* 250* 230* 304*   Lipid Profile: No results for input(s): CHOL, HDL, LDLCALC, TRIG, CHOLHDL, LDLDIRECT in the last 72 hours. Thyroid Function Tests: Recent Labs    07/20/19 0145  TSH 0.355   Anemia Panel: No results for input(s): VITAMINB12, FOLATE, FERRITIN, TIBC, IRON, RETICCTPCT in the last 72 hours. Sepsis Labs: Recent Labs  Lab 07/19/19 2214  PROCALCITON <0.10    Recent Results (from the past 240 hour(s))  SARS CORONAVIRUS 2 (TAT 6-24 HRS) Nasopharyngeal Nasopharyngeal Swab     Status: None   Collection Time: 07/18/19 10:11 AM   Specimen: Nasopharyngeal Swab  Result Value Ref Range Status   SARS Coronavirus 2 NEGATIVE NEGATIVE Final    Comment: (NOTE) SARS-CoV-2 target nucleic acids are NOT DETECTED. The SARS-CoV-2 RNA is generally detectable in upper and lower respiratory specimens during the acute phase of infection. Negative results do not preclude SARS-CoV-2 infection, do not rule out co-infections with other pathogens, and should not be used as the sole basis for treatment or other patient management decisions. Negative results must be combined with clinical observations, patient history, and epidemiological information. The expected result is Negative. Fact Sheet for Patients: SugarRoll.be Fact Sheet for Healthcare Providers: https://www.woods-mathews.com/ This test is not yet approved or cleared by the [place] FDA and  has been authorized for detection and/or diagnosis of SARS-CoV-2 by FDA under an Emergency Use  Authorization (EUA). This EUA will remain  in effect (meaning this test can be used) for the duration of the COVID-19 declaration under Section 56 4(b)(1) of the Act, 21 U.S.C. section 360bbb-3(b)(1), unless the authorization is terminated or revoked sooner. Performed at [facility], [place] [address], [place], [place] [address]   MRSA PCR Screening     Status: Abnormal   Collection Time: 07/19/19  9:31 PM   Specimen: Nasal Mucosa; Nasopharyngeal  Result Value Ref Range Status   MRSA by PCR POSITIVE (A) NEGATIVE Final    Comment:        The GeneXpert MRSA Assay (FDA approved for NASAL specimens only), is one component of a comprehensive MRSA colonization surveillance program. It is not intended to diagnose MRSA infection nor to guide or monitor treatment for MRSA infections. RESULT CALLED TO, READ BACK BY AND VERIFIED WITH: ROTHERMEL,A @ 2263 FH [id number] BY POTEAT,S Performed at [facility], [place] [address] [place], [place] [address]      Radiology Studies: No results found.  Scheduled Meds: . amLODipine  5 mg Oral Daily  . atorvastatin  40 mg Oral QHS  . chlorhexidine  15 mL Mouth Rinse BID  . Chlorhexidine Gluconate Cloth  6 each Topical Daily  . feeding supplement (GLUCERNA SHAKE)  237 mL Oral TID BM  . feeding supplement (PRO-STAT SUGAR FREE 64)  30 mL Oral BID  . fenofibrate  54 mg Oral Daily  . insulin aspart  0-9 Units Subcutaneous Q4H  . insulin glargine  40 Units Subcutaneous QHS  . levothyroxine  112 mcg Oral QAC breakfast  . mouth rinse  15 mL Mouth Rinse q12n4p  . methylPREDNISolone (SOLU-MEDROL) injection  80 mg Intravenous Q12H  . multivitamin with minerals  1 tablet Oral Daily  . mupirocin ointment  1 application Nasal BID  . pantoprazole  40 mg Oral BID AC  . sertraline  100 mg Oral QPM  . sodium chloride flush  3 mL Intravenous Q12H  . vancomycin variable dose per unstable renal function (pharmacist dosing)   Does not apply See  admin instructions   Continuous Infusions: . sodium chloride 75 mL/hr at 07/22/19 1208  . sodium chloride    . azithromycin Stopped (07/21/19 2202)  . ceFEPime (MAXIPIME) IV Stopped (07/21/19 2134)     LOS: 3 days   Time spent: 50 minutes.  Lorella Nimrod, MD [facility] Pager [phone]  If 7PM-7AM, please contact night-coverage [email or web address] Password [facility] 07/22/2019, 1:39 PM   This record has been created using Systems analyst. Errors have been sought and corrected,but may not always be located. Such creation errors do not reflect on the standard of care.

## 2019-07-22 NOTE — Progress Notes (Signed)
Pt's daughter, [name], was outside of pt's room when I arrived. She said her sister, [name], was in the parking lot and explained that they were told there is nothing else that could be done for pt but was not sure if pt was aware of that. Pt was awake, alert and talking when we walked into her room. She almost immediately said she wanted prayer and wanted to wait until [name] had arrived. When He[place]o arrived, we made introductions and had prayer bedside of pt. Pt offered prayer at the close of my prayer for her. Following prayer, one of the daughters said, "and get the girls home safely."  They laughed and explained that their father used to close his prayers with those words and said when he died their mother added the request to her prayers. Family was very appreciative of visit and prayer. Please page if additional assistance is needed. [place], [place]   07/22/19 1400  Clinical Encounter Type  Visited With Patient and family together

## 2019-07-23 DIAGNOSIS — Z7189 Other specified counseling: Secondary | ICD-10-CM

## 2019-07-23 DIAGNOSIS — R0602 Shortness of breath: Secondary | ICD-10-CM

## 2019-07-23 DIAGNOSIS — Z515 Encounter for palliative care: Secondary | ICD-10-CM

## 2019-07-23 LAB — BASIC METABOLIC PANEL
Anion gap: 10 (ref 5–15)
BUN: 97 mg/dL — ABNORMAL HIGH (ref 8–23)
CO2: 35 mmol/L — ABNORMAL HIGH (ref 22–32)
Calcium: 9.6 mg/dL (ref 8.9–10.3)
Chloride: 102 mmol/L (ref 98–111)
Creatinine, Ser: 2.02 mg/dL — ABNORMAL HIGH (ref 0.44–1.00)
GFR calc Af Amer: 26 mL/min — ABNORMAL LOW (ref >60)
GFR calc non Af Amer: 23 mL/min — ABNORMAL LOW (ref >60)
Glucose, Bld: 321 mg/dL — ABNORMAL HIGH (ref 70–99)
Potassium: 4.7 mmol/L (ref 3.5–5.1)
Sodium: 147 mmol/L — ABNORMAL HIGH (ref 135–145)

## 2019-07-23 LAB — RENAL FUNCTION PANEL
Albumin: 3.5 g/dL (ref 3.5–5.0)
Anion gap: 9 (ref 5–15)
BUN: 96 mg/dL — ABNORMAL HIGH (ref 8–23)
CO2: 38 mmol/L — ABNORMAL HIGH (ref 22–32)
Calcium: 9.8 mg/dL (ref 8.9–10.3)
Chloride: 105 mmol/L (ref 98–111)
Creatinine, Ser: 1.96 mg/dL — ABNORMAL HIGH (ref 0.44–1.00)
GFR calc Af Amer: 27 mL/min — ABNORMAL LOW (ref >60)
GFR calc non Af Amer: 23 mL/min — ABNORMAL LOW (ref >60)
Glucose, Bld: 203 mg/dL — ABNORMAL HIGH (ref 70–99)
Phosphorus: 4.6 mg/dL (ref 2.5–4.6)
Potassium: 4.4 mmol/L (ref 3.5–5.1)
Sodium: 152 mmol/L — ABNORMAL HIGH (ref 135–145)

## 2019-07-23 LAB — GLUCOSE, CAPILLARY
Glucose-Capillary: 178 mg/dL — ABNORMAL HIGH (ref 70–99)
Glucose-Capillary: 143 mg/dL — ABNORMAL HIGH (ref 70–99)
Glucose-Capillary: 256 mg/dL — ABNORMAL HIGH (ref 70–99)
Glucose-Capillary: 279 mg/dL — ABNORMAL HIGH (ref 70–99)
Glucose-Capillary: 221 mg/dL — ABNORMAL HIGH (ref 70–99)
Glucose-Capillary: 234 mg/dL — ABNORMAL HIGH (ref 70–99)

## 2019-07-23 LAB — CREATININE, SERUM
Creatinine, Ser: 1.81 mg/dL — ABNORMAL HIGH (ref 0.44–1.00)
GFR calc Af Amer: 30 mL/min — ABNORMAL LOW (ref >60)
GFR calc non Af Amer: 26 mL/min — ABNORMAL LOW (ref >60)

## 2019-07-23 LAB — CBC
HCT: 30.9 % — ABNORMAL LOW (ref 36.0–46.0)
Hemoglobin: 8.8 g/dL — ABNORMAL LOW (ref 12.0–15.0)
MCH: 29.1 pg (ref 26.0–34.0)
MCHC: 28.5 g/dL — ABNORMAL LOW (ref 30.0–36.0)
MCV: 102.3 fL — ABNORMAL HIGH (ref 80.0–100.0)
Platelets: 203 10*3/uL (ref 150–400)
RBC: 3.02 MIL/uL — ABNORMAL LOW (ref 3.87–5.11)
RDW: 21.1 % — ABNORMAL HIGH (ref 11.5–15.5)
WBC: 15.2 10*3/uL — ABNORMAL HIGH (ref 4.0–10.5)
nRBC: 0.1 % (ref 0.0–0.2)

## 2019-07-23 LAB — VANCOMYCIN, RANDOM: Vancomycin Rm: 19

## 2019-07-23 LAB — PROCALCITONIN: Procalcitonin: <0.1 ng/mL

## 2019-07-23 MED ORDER — SODIUM CHLORIDE 0.45 % IV SOLN
INTRAVENOUS | Status: DC
  Administered 2019-07-23 – 2019-07-24 (×2): via INTRAVENOUS

## 2019-07-23 MED ORDER — FUROSEMIDE 10 MG/ML IJ SOLN
40.0000 mg | Freq: Once | INTRAMUSCULAR | Status: AC
  Administered 2019-07-23: 10:00:00 40 mg via INTRAVENOUS
  Filled 2019-07-23: qty 4

## 2019-07-23 NOTE — Progress Notes (Signed)
NAME:  [name], MRN:  [id number], DOB:  [birth year], LOS: 4 ADMISSION DATE:  07/19/2019, CONSULTATION DATE:  11/12 REFERRING MD:  Roel Cluck, CHIEF COMPLAINT:  Recurrent respiratory failure   Brief History   This 84 y/o female was admitted on 11/11 with recurrent hypoxemic/hypercarbic respiratory failure requiring Bipap support in setting of diffuse pulmonary infiltrates of unknown origin s/p cardioversion 06/26/19 s cxr prior to procedure but denied sob prior (extremely sedentary however)    Past Medical History  CKD Stage 3, Anemia of chronic disease,Atrial fibrillation on Elaquis, s/p cardioversion,OSA on CPAP,HFpEF ,HLD,Type II DM HTN,Hypothyroidism,Chronic ITP,PE 2010  Significant Hospital Events   Stopped eliquis 11/13 due to concern with hemoptysis report and ? alv hem on cxr/ ct  Consults:  11/12 >> PCCM  Procedures:   Echo 07/05/19 (p onset of sob/ infiltrates: Grade I diastolic dysfunction with Mild LAE, nl RA  Significant Diagnostic Tests:   10/27 CT Chest >> Widespread ground-glass opacities, small bilat pleural effusions   Micro Data:  11/10 >> Sars-CoV2 neg 11/10 >> MRSA PCR pos 11/11  Urine strep neg 11/11  Urine legionella neg   Note PCT  11/15  Neg    Antimicrobials:  11/11 Vanco   11/11 Cefepime   11/11 Azithromycin   Interim history/subjective:  Sleeping on bibap on rounds, appeared comfortable  Objective   Blood pressure (!) 149/60, pulse 69, temperature 98 F (36.7 C), temperature source Axillary, resp. rate (!) 26, height _0  (1.549 m), weight 109 kg, SpO2 (!) 88 %.    FiO2 (%):  [80 %-100 %] 90 %   Intake/Output Summary (Last 24 hours) at 07/23/2019 1131 Last data filed at 07/23/2019 1000 Gross per 24 hour  Intake 1480.94 ml  Output 1050 ml  Net 430.94 ml  sats 94% on 50% Filed Weights   07/20/19 0013 07/23/19 0337  Weight: 105.7 kg 109 kg    Examination: Obese wf nad off bipap at 45 degrees hob Obese chronically ill  appearing wf nad on bipap No jvd Oropharynx not examined on bipap Neck supple Lungs with distant bs, a few crackles  bilaterally RRR no s3 or or sign murmur Abd obese with limited excursion  Extr warm with no edema or clubbing noted Neuro  Sleeping at present  Lab Results  Component Value Date   CREATININE 1.81 (H) 07/23/2019   CREATININE 1.96 (H) 07/23/2019   CREATININE 2.29 (H) 07/22/2019   CREATININE 2.56 (H) 05/25/2019   CREATININE 2.51 (H) 05/11/2019   CREATININE 2.46 (H) 05/04/2019   CREATININE 1.9 (H) 09/08/2017   CREATININE 2.2 (H) 08/19/2017   CREATININE 2.3 (H) 07/28/2017   CREATININE 1.8 (H) 02/25/2017   CREATININE 1.7 (H) 01/18/2017       Lab Results  Component Value Date   HGB 8.8 (L) 07/23/2019   HGB 8.8 (L) 07/22/2019   HGB 8.4 (L) 07/21/2019   HGB 9.3 (L) 06/30/2019   HGB 9.7 (L) 06/22/2019   HGB 10.2 (L) 06/15/2019   HGB 11.0 (L) 09/08/2017   HGB 10.6 (L) 09/03/2017   HGB 10.6 (L) 08/25/2017     Resolved Hospital Problem list     Assessment & Plan:   Recurrent acute on chronic hypoxic respiratory failure Diffuse pulmonary infiltrates of unclear etiology PAF (cardioverted and in NSR since Jun 26 2019) CRI (stage III) Mild systolic (EF 05-69%) and grade I diastolic HF Obesity   OSA H/o ITP ( ROMIPLOSTIM & FERAHEME) Chronic anemia  DM  Pulmonary problem list:  Recurrent acute on chronic hypoxemic  respiratory failure in setting of diffuse patchy infiltrates.   ddx :  Miscellaneous:Alv microlithiasis, alv proteinosis, asp, bronchiectais    ARDS/ AIP Occupational dz/ HSP Neoplasm Infection w/u in progress> Note PCT  11/15  Neg  And all cultures neg > consider d/c abx 11/16 Drug  - no record of any of the usual suspects (chemo, amiodarone/nitrofurantoin)  Pulmonary emboli, Protein disorders Edema - fairly dry and now hypernatremic also  Eosinophilic dz Sarcoidosis Connective tissue dz/ BOOP > esr 120 prior to steroids suports  Hist X /  Hemorrhage on eliquis > d/c'd 11/13  Idiopathic    PLAN: Wean 02/bipap (esp daytime)  as tol  Reduced solumedrol to 80 mg IV q 12 11/14 consider further decrease 11/16   Consider d/c all abx     Christinia Gully, MD Pulmonary and [facility] [phone] After 5:30 PM or weekends, use Beeper [phone]

## 2019-07-23 NOTE — Consult Note (Signed)
Consultation Note Date: 07/23/2019   Patient Name: [name]  DOB: 09/11/1934  MRN: [id number]  Age / Sex: 84 y.o., female  PCP: Javier Glazier, MD Referring Physician: Lorella Nimrod, MD  Reason for Consultation: Establishing goals of care  HPI/Patient Profile: 84 y.o. female    admitted on 07/19/2019   84 year old lady with a past medical history significant for stage III chronic kidney disease, anemia of chronic disease, atrial fibrillation was maintained on oral anticoagulation with Eliquis, status post cardioversion in October 2020, obstructive sleep apnea, dyslipidemia, diabetes, hypertension, hypothyroidism, chronic ITP, history of PE several years ago was admitted to the hospital medicine service with pulmonary specialist following for recurrent hypoxic/hypercarbic respiratory failure, requiring intermittent BiPAP support versus high flow nasal cannula.  Patient's echocardiogram with grade 1 diastolic dysfunction, patient CT scan of the chest with widespread groundglass opacities, small bilateral pleural effusions.   Clinical Assessment and Goals of Care: Patient was initially given broad-spectrum antibiotics.  Remains admitted to stepdown unit at [facility] in [place], [place] with recurrent acute on chronic hypoxic respiratory failure, diffuse pulmonary infiltrates of unclear etiology.  Deemed to have pneumonitis versus bronchiectasis versus some type of interstitial disease pattern.  Palliative consultation for overall goals of care discussions.  I find that [name] is awake alert sitting up in bed.  She is on high flow nasal cannula.  She ate some grits for breakfast.  She responds appropriately.  She believes she is somewhat better.  Daughter [name] present at the bedside, another daughter [name] participated in this initial palliative encounter on the phone.  I  introduced myself and palliative care, define the nature of my visit, explained about goals of care as follows:  Palliative medicine is specialized medical care for people living with serious illness. It focuses on providing relief from the symptoms and stress of a serious illness. The goal is to improve quality of life for both the patient and the family.  Goals of care: Broad aims of medical therapy in relation to the patient's values and preferences. Our aim is to provide medical care aimed at enabling patients to achieve the goals that matter most to them, given the circumstances of their particular medical situation and their constraints.   Goals wishes and values attempted to be explored.  Patient has been living at [facility] facility in the assisted living side for about a year and a half now.  She is largely sedentary.  Using the computer present in the room, we were able to review patient's current medical condition, current imaging, current blood work.  Family had concerns about patient's elevated blood sugars.  Discussed about IV Solu-Medrol being used for her lungs currently.  Additionally, we reviewed about the patient's high oxygen requirements presently.  Family states that the patient did not use supplemental oxygen prior to this hospitalization, did not have any underlying lung conditions that they know of.  They hope before ongoing improvement/recovery so that the patient can transition back to her familiar surroundings.  If that happens, patient will benefit from palliative care following her at [facility].  If the patient has escalating symptom burden or ongoing high oxygen needs or worsening lung imaging, then, at that time, it would be prudent to consider more of a comfort focused pathway.  This could include transfer to residential hospice.  Reviewed briefly with patient and both daughters about comfort care/hospice philosophy of care.  Please note additional discussions  as listed below.  Thank you for the consult.  NEXT OF KIN 2 daughters.  Lives at [facility]  Discussion/SUMMARY OF RECOMMENDATIONS    Agree with DNR.  Goals of care discussions with patient, daughter at bedside and another daughter [name] on the phone:  Plan: Continue current mode of care for now.  If patient with ongoing high O2 needs or with no meaningful chances of recovery, they will consider a more comfort focused mode of care, possibly also consider hospice at [facility] [place] [facility] where the patient resides.   Family looking for guidance into exact etiology for the patient's current pulmonary status, have tried discussing this, to the best of my ability.  PMT to follow.   Thank you for the consult.   Code Status/Advance Care Planning:  DNR    Symptom Management:    continue current mode of care.   Palliative Prophylaxis:   Delirium Protocol   Psycho-social/Spiritual:   Desire for further Chaplaincy support:yes  Additional Recommendations: Caregiving  Support/Resources  Prognosis:   Unable to determine  Discharge Planning: To Be Determined      Primary Diagnoses: Present on Admission: . Pneumonitis . Hypothyroidism . HYPERCHOLESTEROLEMIA . OBESITY . Essential hypertension . Acute respiratory failure with hypoxia ([place]) . Chronic kidney disease (CKD) stage G4/A1, severely decreased glomerular filtration rate (GFR) between 15-29 mL/min/1.73 square meter and albuminuria creatinine ratio less than 30 mg/g (HCC) . Chronic ITP (idiopathic thrombocytopenia) (HCC) . Normocytic anemia . Atypical atrial flutter ([place]) . HCAP (healthcare-associated pneumonia)   I have reviewed the medical record, interviewed the patient and family, and examined the patient. The following aspects are pertinent.  Past Medical History:  Diagnosis Date  . Allergic rhinitis   . Anxiety state, unspecified    panic attacks  . CHF (congestive heart failure) ([place])   . Depressive  disorder, not elsewhere classified   . Extrinsic asthma, unspecified    no problem since adulthood  . Obesity   . OSA on CPAP    severe  . Pure hypercholesterolemia   . Respiratory failure with hypoxia ([place]) 09/2008   acute, secondary to multiple bilateral pulmonary embolism , negative hypercoagulable workup 09/2008 hospital stay  . Scoliosis   . Type II or unspecified type diabetes mellitus without mention of complication, not stated as uncontrolled   . Unspecified essential hypertension   . Unspecified hypothyroidism    hypo   Social History   Socioeconomic History  . Marital status: Widowed    Spouse name: Not on file  . Number of children: 2  . Years of education: Not on file  . Highest education level: Not on file  Occupational History  . Occupation: OWNER    Employer: [employer]: Self employed- runs Advertising copywriter  Social Needs  . Financial resource strain: Not on file  . Food insecurity    Worry: Not on file    Inability: Not on file  . Transportation needs    Medical: Not on file    Non-medical: Not on file  Tobacco  Use  . Smoking status: Former Smoker    Packs/day: 0.25    Years: 20.00    Pack years: 5.00    Types: Cigarettes    Quit date: 09/07/1989    Years since quitting: 29.8  . Smokeless tobacco: Never Used  Substance and Sexual Activity  . Alcohol use: No  . Drug use: No  . Sexual activity: Not Currently  Lifestyle  . Physical activity    Days per week: Not on file    Minutes per session: Not on file  . Stress: Not on file  Relationships  . Social Herbalist on phone: Not on file    Gets together: Not on file    Attends religious service: Not on file    Active member of club or organization: Not on file    Attends meetings of clubs or organizations: Not on file    Relationship status: Not on file  Other Topics Concern  . Not on file  Social History Narrative   Lives at [facility]   Family History  Problem Relation  Age of Onset  . Allergies Mother   . Clotting disorder Mother   . Osteoarthritis Mother   . Asthma Mother   . Arthritis Other   . Diabetes Other   . Hyperlipidemia Other   . Hypertension Other   . Coronary artery disease Other   . Stroke Other   . Osteoarthritis Daughter   . Rheum arthritis Maternal Grandmother   . Clotting disorder Maternal Grandmother   . Clotting disorder Maternal Uncle   . Clotting disorder Daughter   . Allergies Daughter   . Breast cancer Neg Hx    Scheduled Meds: . amLODipine  5 mg Oral Daily  . atorvastatin  40 mg Oral QHS  . chlorhexidine  15 mL Mouth Rinse BID  . Chlorhexidine Gluconate Cloth  6 each Topical Daily  . feeding supplement (GLUCERNA SHAKE)  237 mL Oral TID BM  . feeding supplement (PRO-STAT SUGAR FREE 64)  30 mL Oral BID  . fenofibrate  54 mg Oral Daily  . insulin aspart  0-20 Units Subcutaneous Q4H  . insulin glargine  40 Units Subcutaneous QHS  . levothyroxine  112 mcg Oral QAC breakfast  . mouth rinse  15 mL Mouth Rinse q12n4p  . methylPREDNISolone (SOLU-MEDROL) injection  80 mg Intravenous Q12H  . multivitamin with minerals  1 tablet Oral Daily  . mupirocin ointment  1 application Nasal BID  . pantoprazole  40 mg Oral BID AC  . sertraline  100 mg Oral QPM  . sodium chloride flush  3 mL Intravenous Q12H   Continuous Infusions: . sodium chloride 50 mL/hr at 07/23/19 0607  . sodium chloride    . azithromycin Stopped (07/22/19 2305)   PRN Meds:.sodium chloride, acetaminophen **OR** acetaminophen, albuterol, bisacodyl, hydrALAZINE, HYDROcodone-acetaminophen, ondansetron **OR** ondansetron (ZOFRAN) IV, sodium chloride flush Medications Prior to Admission:  Prior to Admission medications   Medication Sig Start Date End Date Taking? Authorizing Provider  acetaminophen (TYLENOL) 325 MG tablet Take 650 mg by mouth every 6 (six) hours as needed for moderate pain or fever (for fever or pain/discomfort).    Yes [provider]   amoxicillin (AMOXIL) 500 MG capsule Take 1,000-2,000 mg by mouth See admin instructions. Take 1,000 mg by mouth one hour before dental appointments and 1,000 mg afterwards AS DIRECTED   Yes [provider]  apixaban (ELIQUIS) 2.5 MG TABS tablet Take 1 tablet (2.5 mg total)  by mouth 2 (two) times daily. 04/18/19  Yes Jerline Pain, MD  atorvastatin (LIPITOR) 40 MG tablet Take 40 mg by mouth at bedtime.    Yes [provider]  bisacodyl (DULCOLAX) 5 MG EC tablet Take 10 mg by mouth every 3 (three) days as needed (for constipation).    Yes [provider]  Cholecalciferol (VITAMIN D3) 2000 units TABS Take 2,000 Units by mouth daily.   Yes [provider]  diphenhydrAMINE (BANOPHEN) 25 MG tablet Take 25 mg by mouth as needed ("for adverse food reactions").    Yes [provider]  docusate sodium (COLACE) 100 MG capsule Take 100 mg by mouth daily.  04/30/15  Yes [provider]  fenofibrate 54 MG tablet TAKE 1 TABLET(54 MG) BY MOUTH DAILY Patient taking differently: Take 54 mg by mouth daily.  08/04/17  Yes Nafziger, Tommi Rumps, NP  fexofenadine (ALLEGRA) 180 MG tablet Take 180 mg by mouth at bedtime.    Yes [provider]  Flaxseed, Linseed, (FLAXSEED OIL) 1000 MG CAPS Take 1,000 mg by mouth 2 (two) times daily.    Yes [provider]  furosemide (LASIX) 20 MG tablet Take 20-40 mg by mouth See admin instructions. Take 20 mg by mouth in the morning on Sun/Tues/Thurs/Sat 40mg  on Mon/Wed/Friday   Yes [provider]  HUMALOG 100 UNIT/ML injection Inject 25-35 Units into the skin See admin instructions. "Inject 35 units into the skin before breakfast, 30 units before lunch, and 25 units before supper, per sliding scale: BGL >90 = give the full dose for a regular meal, 60-90 = 17 units before breakfast, 15 units before lunch, and 12 units before supper; hold if <60" 02/10/19  Yes [provider]  levothyroxine (SYNTHROID,  LEVOTHROID) 112 MCG tablet TAKE 1 TABLET BY MOUTH DAILY Patient taking differently: Take 112 mcg by mouth daily before breakfast.  11/24/17  Yes Nafziger, Tommi Rumps, NP  Multiple Vitamin (MULTIVITAMIN) tablet Take 1 tablet by mouth daily.     Yes [provider]  omeprazole (PRILOSEC) 20 MG capsule Take 20 mg by mouth daily.  01/25/19  Yes [provider]  potassium citrate (UROCIT-K) 10 MEQ (1080 MG) SR tablet Take 10 mEq by mouth 2 (two) times daily.   Yes [provider]  predniSONE (DELTASONE) 5 MG tablet Take 7 tablets (35 mg total) by mouth daily with breakfast for 3 days, THEN 6 tablets (30 mg total) daily with breakfast for 5 days, THEN 5 tablets (25 mg total) daily with breakfast for 5 days, THEN 4 tablets (20 mg total) daily with breakfast for 5 days, THEN 3 tablets (15 mg total) daily with breakfast for 5 days, THEN 2 tablets (10 mg total) daily with breakfast for 5 days, THEN 1 tablet (5 mg total) daily with breakfast for 5 days. 07/19/19 08/21/19 Yes Aslam, Loralyn Freshwater, MD  sertraline (ZOLOFT) 100 MG tablet Take 100 mg by mouth every evening.  05/24/18  Yes [provider]  vitamin C (ASCORBIC ACID) 250 MG tablet Take 250 mg by mouth daily.   Yes [provider]  Continuous Blood Gluc Sensor (FREESTYLE LIBRE 14 DAY SENSOR) MISC Inject 1 patch into the skin every 14 (fourteen) days.    [provider]  Dulaglutide (TRULICITY) 1.5 CW/2.3JS SOPN Inject 1.5 mg into the skin once a week. Patient not taking: Reported on 07/19/2019 09/09/18   Philemon Kingdom, MD  insulin glargine (LANTUS) 100 UNIT/ML injection Inject 0.4 mLs (40 Units total) into the skin  daily. Patient not taking: Reported on 07/19/2019 02/22/19   Philemon Kingdom, MD  Insulin Pen Needle (B-D UF III MINI PEN NEEDLES) 31G X 5 MM MISC Use daily for insulin injection 12/15/16   Nafziger, Tommi Rumps, NP  Insulin Syringe-Needle U-100 (INSULIN SYRINGE 1CC/30GX5/16") 30G X 5/16" 1 ML MISC 2 (two) times  daily.  01/27/17   [provider]   Allergies  Allergen Reactions  . Adhesive [Tape] Other (See Comments)    PULLS OFF THE SKIN  . Apixaban Other (See Comments)    Internal Bleeding  . Aspirin Itching, Rash, Hives and Swelling    Swelling of her tongue  . Mirabegron Other (See Comments)    Patient experienced A-Fib  . Pineapple Anaphylaxis and Swelling    Throat swells and blisters on tongue and roof of mouth per patient  . Metformin Diarrhea and Nausea Only  . Tetracycline Hives  . Fluticasone-Salmeterol Itching and Rash  . Iodinated Diagnostic Agents Itching and Rash       . Lactose Intolerance (Gi) Other (See Comments)    Gas   . Latex Itching, Rash and Other (See Comments)    Pulls off the skin and causes welts  . Lisinopril Cough  . Metronidazole Other (See Comments)    Reaction not known  . Sulfa Antibiotics Rash  . Sulfonamide Derivatives Itching and Rash   Review of Systems Denies pain  Physical Exam Weak appearing elderly lady sitting up in bed Patient is currently off BiPAP, on 20 L high flow nasal cannula Distant diminished breath sounds S1-S2 Abdomen is obese, nontender Extremities with no edema Awake alert oriented.  Answers all questions appropriately.  Vital Signs: BP (!) 149/60   Pulse 69   Temp 98.7 F (37.1 C)   Resp (!) 26   Ht 5\' 1"  (1.549 m)   Wt 109 kg   LMP  (LMP Unknown)   SpO2 (!) 88%   BMI 45.40 kg/m  Pain Scale: 0-10   Pain Score: Asleep   SpO2: SpO2: (!) 88 % O2 Device:SpO2: (!) 88 % O2 Flow Rate: .O2 Flow Rate (L/min): 20 L/min  IO: Intake/output summary:   Intake/Output Summary (Last 24 hours) at 07/23/2019 1315 Last data filed at 07/23/2019 1000 Gross per 24 hour  Intake 1480.94 ml  Output 1050 ml  Net 430.94 ml    LBM: Last BM Date: 07/18/19 Baseline Weight: Weight: 105.7 kg Most recent weight: Weight: 109 kg     Palliative Assessment/Data:   PPS 40%  Time In:  12 Time Out:  1310 Time Total:   70  Greater than 50%  of this time was spent counseling and coordinating care related to the above assessment and plan.  Signed by: Loistine Chance, MD   Please contact Palliative Medicine Team phone at [phone] for questions and concerns.  For individual provider: See Shea Evans

## 2019-07-23 NOTE — Progress Notes (Signed)
Pt tolerated 20L, 90% FIO2 High flow nasal cannula from 8am-3pm

## 2019-07-23 NOTE — Progress Notes (Signed)
Pt was awake and lying in bed when I arrived. Her daughter [name] was bedside; [name] joined at the end of our visit. Pt wanted prayer and asked for healing and a miracle. Chaplain provided prayer and presence. Please page if additional support is needed.  [place], [place]   07/23/19 1400  Clinical Encounter Type  Visited With Patient and family together

## 2019-07-23 NOTE — Progress Notes (Signed)
PROGRESS NOTE    [name]  TKW:[id number] DOB: 01/14/1935 DOA: 07/19/2019 PCP: Javier Glazier, MD   Brief Narrative:  [name] a 84 y.o.femalewith medical history significant of chronic ITP, CKD stage3, anemia of chronic disease, A.fib on Eliquis,sleep apnea on CPAP history of CHF HLD history of pulmonary embolism in 2010 diabetes type 2, HTN,hypothyroidism, pressure ulcer Presented withworsening shortness of breathfromSNF. Multiple recent admissions in October, most recentlyshe was discharged to SNF 1 day ago from [facility] where she was treated for acute respiratory failure and thought to be due to pneumonitis and was sent home with tapering dose of steroid. She was having increased oxygen requirement, initially managed with nonrebreather. BiPAP was started later due to worsening respiratory status. Chest x-ray with worsening of bilateral infiltrate, broader differential which includes pneumonitis, bilateral pneumonia, ARDS, pulmonary vascular congestion. She was started on broad-spectrum antibiotics,Lasix and steroids. PCCM is on board and does not think that there is much to offer for her.  Subjective: Patient was feeling little better when seen this morning.  She had no new complaints.  Assessment & Plan:   Principal Problem:   Acute respiratory failure with hypoxia (HCC) Active Problems:   Hypothyroidism   HYPERCHOLESTEROLEMIA   OBESITY   Essential hypertension   Chronic kidney disease (CKD) stage G4/A1, severely decreased glomerular filtration rate (GFR) between 15-29 mL/min/1.73 square meter and albuminuria creatinine ratio less than 30 mg/g (HCC)   Chronic kidney disease   Chronic ITP (idiopathic thrombocytopenia) (HCC)   Normocytic anemia   Atypical atrial flutter (HCC)   HCAP (healthcare-associated pneumonia)   Pneumonitis  Acute on chronic hypoxic and hypercapnic respiratory failure. Differential continue to remain broad as there is  no definitive diagnosis at this time. ARDS/pneumonitis/aspiration/pulmonary edema/PE. COVID-19 infection unlikely as negative PCR and no fever. PE unlikely as patient is on DOAC. Aspiration/bilateral pneumonia can be a possibility although patient is afebrile-we will continue broad-spectrum antibiotics with cefepime, vancomycin and azithromycin. BNP was not very impressive for heart failure,not much response to diuresis.ESR is not very impressive either but patient was on steroid.  She had one episode of small amount of hemoptysis yesterday. -Patient was on high-dose steroids which was decreased to Solu-Medrol 80 mg twice daily by PCCM yesterday. -Calcitonin check twice was less than 0.10 which shows discontinuation of antibiotics.  I will discontinue cefepime and vancomycin.  We will continue azithromycin for today as it will complete 5 days course. -Repeat chest x-ray with worsening of bilateral infiltrate.  ARDS/pulmonary edema/atypical infection which is unlikely.  Most likely worsening of her parenchymal lung disease.  Patient appears dry but we will give her 1 dose of Lasix to see if her renal function and hyponatremia tolerates it or if it make any difference to her respiratory status. -Palliative consult.-Will appreciate their help. -Very difficult situation, apparently poor prognosis.  Discussed in detail with both daughters 1 in person, other on phone, they wants to take some time to make further decision regarding her goals of care.  Hypertension.   Blood pressure improving. -Continue amlodipine 5 mg daily with as needed hydralazine.  AKI with CKD.  Slight improvement in renal function. -We will try 1 dose of Lasix 40 mg IV and continue gentle hydration. -Continue monitoring renal function. -Avoid nephrotoxic.  Hypernatremia.  Worsening sodium, 152 today. -Continue gentle hydration with half saline.  Cannot give D5 because of hyperglycemia. -Monitor electrolytes  Diastolic  heart failure.  Repeat BNP slightly more than admission but remains unimpressive. -  Giving her 1 dose of Lasix 40 mg IV today. -Continue with strict ins and outs and daily weights.  Chronic ITP.Patient recently received 2 doses of Nplate and Aranesp during previous hospitalization. Under care of Dr. Marin Olp. Oncology is following-we appreciate their recommendations. Platelets stable currently. -Solu-Medrol dose was decreased to 80 mg twice daily today.  Leukocytosis.  Most likely secondary to steroid.  Remained afebrile.  Type 2 diabetes. A1c 7.3 on 07/19/2019. Remains hyperglycemic due to high-dose steroids. -Convert SSI to resistant scale. -Keep monitoring.  Atypical atrial flutter.Currently in sinus rhythm with history of recent cardioversion on 06/26/2019. -Eliquis was discontinued 11/13 due to hemoptysis.  Hypothyroidism. TSH 0.838 on 07/04/2019. -Continue home dose of Synthroid.  OSA.On nocturnal CPAP at home, currently on BiPAP.  Objective: Vitals:   07/23/19 0700 07/23/19 0800 07/23/19 0833 07/23/19 0845  BP:  (!) 132/40    Pulse:  63 65 64  Resp:  19 (!) 32 (!) 24  Temp: 98 F (36.7 C)     TempSrc: Axillary     SpO2:  100% 95% 91%  Weight:      Height:        Intake/Output Summary (Last 24 hours) at 07/23/2019 0914 Last data filed at 07/23/2019 0647 Gross per 24 hour  Intake 1654.06 ml  Output 1050 ml  Net 604.06 ml   Filed Weights   07/20/19 0013 07/23/19 0337  Weight: 105.7 kg 109 kg    Examination:  General exam: Appears calm and comfortable  Respiratory system: Bilateral scattered crackles, decreased breath sound at bases, respiratory effort normal. Cardiovascular system: S1 & S2 heard, RRR. No JVD, murmurs, rubs, gallops or clicks.  Trace pedal edema. Gastrointestinal system: Abdomen is nondistended, soft and nontender. No organomegaly or masses felt. Normal bowel sounds heard. Central nervous system: Alert and oriented. No focal  neurological deficits. Extremities: Symmetric 5 x 5 power. Skin: Multiple upper extremity ecchymosis. Psychiatry: Judgement and insight appear normal. Mood & affect appropriate.   DVT prophylaxis: SCDs Code Status: DNR Family Communication: Both daughters were updated one in person and other on phone. Disposition Plan: Pending improvement/goals of care discussion. Prognosis seems poor.  Consultants:   PCCM  Oncology  Procedures:   Antimicrobials:  Azithromycin day 5. Stopped vancomycin and cefepime.  Data Reviewed: I have personally reviewed following labs and imaging studies  CBC: Recent Labs  Lab 07/19/19 1615 07/20/19 0145 07/21/19 0243 07/22/19 0204 07/23/19 0242  WBC 15.4* 11.0* 11.1* 14.5* 15.2*  NEUTROABS 14.0*  --   --   --   --   HGB 10.3* 8.1* 8.4* 8.8* 8.8*  HCT 34.3* 27.1* 28.0* 30.0* 30.9*  MCV 95.8 97.5 97.6 99.3 102.3*  PLT 224 156 187 214 408   Basic Metabolic Panel: Recent Labs  Lab 07/19/19 1615 07/20/19 0145 07/21/19 0243 07/22/19 0204 07/23/19 0242  NA 140 137 147* 150* 152*  K 4.8 4.8 4.9 4.5 4.4  CL 91* 90* 97* 101 105  CO2 40* 36* 37* 37* 38*  GLUCOSE 185* 268* 280* 290* 203*  BUN 103* 102* 105* 93* 96*  CREATININE 2.16* 1.94* 2.16* 2.29* 1.96*  1.81*  CALCIUM 10.6* 9.1 9.7 9.8 9.8  MG  --  2.3  --   --   --   PHOS  --  5.3* 5.0*  --  4.6   GFR: Estimated Creatinine Clearance: 27.8 mL/min (A) (by C-G formula based on SCr of 1.81 mg/dL (H)). Liver Function Tests: Recent Labs  Lab 07/20/19 0145 07/21/19 0243  07/23/19 0242  AST 19  --   --   ALT 18  --   --   ALKPHOS 38  --   --   BILITOT 1.0  --   --   PROT 6.0*  --   --   ALBUMIN 3.2* 3.2* 3.5   No results for input(s): LIPASE, AMYLASE in the last 168 hours. No results for input(s): AMMONIA in the last 168 hours. Coagulation Profile: No results for input(s): INR, PROTIME in the last 168 hours. Cardiac Enzymes: No results for input(s): CKTOTAL, CKMB, CKMBINDEX,  TROPONINI in the last 168 hours. BNP (last 3 results) No results for input(s): PROBNP in the last 8760 hours. HbA1C: No results for input(s): HGBA1C in the last 72 hours. CBG: Recent Labs  Lab 07/22/19 1156 07/22/19 1533 07/22/19 1952 07/22/19 2316 07/23/19 0318  GLUCAP 304* 362* 265* 251* 178*   Lipid Profile: No results for input(s): CHOL, HDL, LDLCALC, TRIG, CHOLHDL, LDLDIRECT in the last 72 hours. Thyroid Function Tests: No results for input(s): TSH, T4TOTAL, FREET4, T3FREE, THYROIDAB in the last 72 hours. Anemia Panel: No results for input(s): VITAMINB12, FOLATE, FERRITIN, TIBC, IRON, RETICCTPCT in the last 72 hours. Sepsis Labs: Recent Labs  Lab 07/19/19 2214 07/22/19 1446 07/23/19 0242  PROCALCITON <0.10 <0.10 <0.10    Recent Results (from the past 240 hour(s))  SARS CORONAVIRUS 2 (TAT 6-24 HRS) Nasopharyngeal Nasopharyngeal Swab     Status: None   Collection Time: 07/18/19 10:11 AM   Specimen: Nasopharyngeal Swab  Result Value Ref Range Status   SARS Coronavirus 2 NEGATIVE NEGATIVE Final    Comment: (NOTE) SARS-CoV-2 target nucleic acids are NOT DETECTED. The SARS-CoV-2 RNA is generally detectable in upper and lower respiratory specimens during the acute phase of infection. Negative results do not preclude SARS-CoV-2 infection, do not rule out co-infections with other pathogens, and should not be used as the sole basis for treatment or other patient management decisions. Negative results must be combined with clinical observations, patient history, and epidemiological information. The expected result is Negative. Fact Sheet for Patients: SugarRoll.be Fact Sheet for Healthcare Providers: https://www.woods-mathews.com/ This test is not yet approved or cleared by the [place] FDA and  has been authorized for detection and/or diagnosis of SARS-CoV-2 by FDA under an Emergency Use Authorization (EUA). This EUA will  remain  in effect (meaning this test can be used) for the duration of the COVID-19 declaration under Section 56 4(b)(1) of the Act, 21 U.S.C. section 360bbb-3(b)(1), unless the authorization is terminated or revoked sooner. Performed at [facility], [place] [address] [place], [place] [address]   MRSA PCR Screening     Status: Abnormal   Collection Time: 07/19/19  9:31 PM   Specimen: Nasal Mucosa; Nasopharyngeal  Result Value Ref Range Status   MRSA by PCR POSITIVE (A) NEGATIVE Final    Comment:        The GeneXpert MRSA Assay (FDA approved for NASAL specimens only), is one component of a comprehensive MRSA colonization surveillance program. It is not intended to diagnose MRSA infection nor to guide or monitor treatment for MRSA infections. RESULT CALLED TO, READ BACK BY AND VERIFIED WITH: ROTHERMEL,A @ 5284 XL [id number] BY POTEAT,S Performed at [facility], [place] [address], [place], [place] [address]      Radiology Studies: Dg Chest Port 1 View  Result Date: 07/22/2019 CLINICAL DATA:  Shortness of breath and respiratory failure. EXAM: PORTABLE CHEST 1 VIEW COMPARISON:  07/19/2019 FINDINGS: Stable  cardiac enlargement. Overall degree of diffuse pulmonary edema/ARDS appears slightly worse compared to the prior chest x-ray. No pneumothorax. Probable small bilateral pleural effusions. IMPRESSION: Slight worsening of diffuse pulmonary edema/ARDS since the prior chest x-ray. Probable small bilateral pleural effusions. Electronically Signed   By: Aletta Edouard M.D.   On: 07/22/2019 14:54    Scheduled Meds: . amLODipine  5 mg Oral Daily  . atorvastatin  40 mg Oral QHS  . chlorhexidine  15 mL Mouth Rinse BID  . Chlorhexidine Gluconate Cloth  6 each Topical Daily  . feeding supplement (GLUCERNA SHAKE)  237 mL Oral TID BM  . feeding supplement (PRO-STAT SUGAR FREE 64)  30 mL Oral BID  . fenofibrate  54 mg Oral Daily  . furosemide  40 mg Intravenous Once  .  insulin aspart  0-20 Units Subcutaneous Q4H  . insulin glargine  40 Units Subcutaneous QHS  . levothyroxine  112 mcg Oral QAC breakfast  . mouth rinse  15 mL Mouth Rinse q12n4p  . methylPREDNISolone (SOLU-MEDROL) injection  80 mg Intravenous Q12H  . multivitamin with minerals  1 tablet Oral Daily  . mupirocin ointment  1 application Nasal BID  . pantoprazole  40 mg Oral BID AC  . sertraline  100 mg Oral QPM  . sodium chloride flush  3 mL Intravenous Q12H   Continuous Infusions: . sodium chloride 50 mL/hr at 07/23/19 0607  . sodium chloride    . azithromycin Stopped (07/22/19 2305)     LOS: 4 days   Time spent: 40 minutes  Lorella Nimrod, MD [facility] Pager [phone]  If 7PM-7AM, please contact night-coverage [email or web address] Password [facility] 07/23/2019, 9:14 AM   This record has been created using Systems analyst. Errors have been sought and corrected,but may not always be located. Such creation errors do not reflect on the standard of care.

## 2019-07-24 ENCOUNTER — Inpatient Hospital Stay (HOSPITAL_COMMUNITY): Payer: Medicare Other

## 2019-07-24 DIAGNOSIS — Z515 Encounter for palliative care: Secondary | ICD-10-CM

## 2019-07-24 DIAGNOSIS — R0602 Shortness of breath: Secondary | ICD-10-CM

## 2019-07-24 DIAGNOSIS — Z7189 Other specified counseling: Secondary | ICD-10-CM

## 2019-07-24 LAB — GLUCOSE, CAPILLARY
Glucose-Capillary: 126 mg/dL — ABNORMAL HIGH (ref 70–99)
Glucose-Capillary: 165 mg/dL — ABNORMAL HIGH (ref 70–99)
Glucose-Capillary: 191 mg/dL — ABNORMAL HIGH (ref 70–99)
Glucose-Capillary: 262 mg/dL — ABNORMAL HIGH (ref 70–99)
Glucose-Capillary: 348 mg/dL — ABNORMAL HIGH (ref 70–99)
Glucose-Capillary: 91 mg/dL (ref 70–99)

## 2019-07-24 LAB — CBC
HCT: 28.6 % — ABNORMAL LOW (ref 36.0–46.0)
Hemoglobin: 8.2 g/dL — ABNORMAL LOW (ref 12.0–15.0)
MCH: 29.5 pg (ref 26.0–34.0)
MCHC: 28.7 g/dL — ABNORMAL LOW (ref 30.0–36.0)
MCV: 102.9 fL — ABNORMAL HIGH (ref 80.0–100.0)
Platelets: 146 10*3/uL — ABNORMAL LOW (ref 150–400)
RBC: 2.78 MIL/uL — ABNORMAL LOW (ref 3.87–5.11)
RDW: 21.2 % — ABNORMAL HIGH (ref 11.5–15.5)
WBC: 10 10*3/uL (ref 4.0–10.5)
nRBC: 0 % (ref 0.0–0.2)

## 2019-07-24 LAB — RENAL FUNCTION PANEL
Albumin: 3.2 g/dL — ABNORMAL LOW (ref 3.5–5.0)
Anion gap: 8 (ref 5–15)
BUN: 98 mg/dL — ABNORMAL HIGH (ref 8–23)
CO2: 38 mmol/L — ABNORMAL HIGH (ref 22–32)
Calcium: 9.1 mg/dL (ref 8.9–10.3)
Chloride: 103 mmol/L (ref 98–111)
Creatinine, Ser: 2.03 mg/dL — ABNORMAL HIGH (ref 0.44–1.00)
GFR calc Af Amer: 26 mL/min — ABNORMAL LOW (ref >60)
GFR calc non Af Amer: 22 mL/min — ABNORMAL LOW (ref >60)
Glucose, Bld: 196 mg/dL — ABNORMAL HIGH (ref 70–99)
Phosphorus: 4.8 mg/dL — ABNORMAL HIGH (ref 2.5–4.6)
Potassium: 4.5 mmol/L (ref 3.5–5.1)
Sodium: 149 mmol/L — ABNORMAL HIGH (ref 135–145)

## 2019-07-24 LAB — PROCALCITONIN: Procalcitonin: 0.16 ng/mL

## 2019-07-24 MED ORDER — HYDRALAZINE HCL 20 MG/ML IJ SOLN
10.0000 mg | Freq: Four times a day (QID) | INTRAMUSCULAR | Status: DC | PRN
  Administered 2019-07-24: 10 mg via INTRAVENOUS
  Filled 2019-07-24 (×3): qty 1

## 2019-07-24 MED ORDER — SALINE SPRAY 0.65 % NA SOLN
1.0000 | NASAL | Status: DC | PRN
  Filled 2019-07-24: qty 44

## 2019-07-24 MED ORDER — AMLODIPINE BESYLATE 10 MG PO TABS
10.0000 mg | ORAL_TABLET | Freq: Every day | ORAL | Status: DC
  Administered 2019-07-25 – 2019-08-01 (×8): 10 mg via ORAL
  Filled 2019-07-24 (×8): qty 1

## 2019-07-24 NOTE — Evaluation (Signed)
Physical Therapy Evaluation Patient Details Name: [name] MRN: [id number] DOB: [birth year] Today's Date: 07/24/2019   History of Present Illness  Pt is 84 y/o F with PMH: CKD stage 3, Anemia, Afib (on Elaquis), OSA on CPAP. Was d/c'ed to SNF from [facility] for respiratory failure on 07/18/19 (several recent admissions). Pt re-presented to acute setting with increasing oxygen requirement  on 11/11, required BiPaPand found to have worsening bilateral lung infiltrates on CXR, pneumonitis, b/l PNA, ARDS.  Clinical Impression   Pt presents with global weakness, difficulty performing bed mobility, dyspnea on exertion with increased work of breathing currently on 20L HFNC with 50% FiO2, fair sitting balance, and decreased activity tolerance. Pt to benefit from acute PT to address deficits. Pt required max assist for supine<>sit today, mobility not progressed OOB due to pt work of breathing and high O2 requirement at this time. SpO2 87-99% on 20L HFNC, HR stable with RR up to mid30s. PT recommends SNF level of care post-acutely. PT to progress mobility as tolerated, and will continue to follow acutely.      Follow Up Recommendations SNF    Equipment Recommendations  None recommended by PT    Recommendations for Other Services       Precautions / Restrictions Precautions Precautions: Fall Restrictions Weight Bearing Restrictions: No      Mobility  Bed Mobility Overal bed mobility: Needs Assistance Bed Mobility: Supine to Sit;Sit to Supine     Supine to sit: Mod assist;HOB elevated Sit to supine: Max assist;+2 for safety/equipment;+2 for physical assistance;HOB elevated   General bed mobility comments: Max assist for supine<>sit for trunk elevation and lowering, LE management, scooting to and from EOB with use of bed pad. Pt with good initiation of movement towards EOB, requires max +2 for scooting up in bed upon return to supine. Pt sat EOB ~10 minutes, RR mid30s with increased work of  breathing with sats 87-99% on 20LO2 via HFNC  Transfers Overall transfer level: (NT - pt with increased work of breathing in sitting)                  Ambulation/Gait                Stairs            Wheelchair Mobility    Modified Rankin (Stroke Patients Only)       Balance Overall balance assessment: Needs assistance Sitting-balance support: Bilateral upper extremity supported;Feet unsupported Sitting balance-Leahy Scale: Fair Sitting balance - Comments: able to sit EOB ~10 minutes with min guard for safety                                     Pertinent Vitals/Pain Pain Assessment: Faces Faces Pain Scale: Hurts little more Pain Location: generalized with mobility Pain Descriptors / Indicators: Grimacing;Discomfort Pain Intervention(s): Limited activity within patient's tolerance;Monitored during session;Repositioned    Home Living Family/patient expects to be discharged to:: Skilled nursing facility Living Arrangements: Other (Comment) Available Help at Discharge: Available PRN/intermittently Type of Home: Apartment       Home Layout: One level Home Equipment: Walker - 2 wheels;Walker - 4 wheels;Cane - single point;Grab bars - toilet;Grab bars - tub/shower;Shower seat;Hand held shower head Additional Comments: sleeps in lift chair    Prior Function Level of Independence: Needs assistance   Gait / Transfers Assistance Needed: pt reports walking with RW vs rollator PTA recent admissions.  Pt states she mobilized independently in apt.  ADL's / Homemaking Assistance Needed: Aides assissted with med mgt, bathing, and some aspects of dressing.        Hand Dominance   Dominant Hand: Right    Extremity/Trunk Assessment   Upper Extremity Assessment RUE Deficits / Details: Per OT eval 3 days ago: shoulder 3+/5, elbow 4-/5, grips 4-/5 LUE Deficits / Details: per OT eval 3 days ago: shoulder 3+/5, elbow 4-/5, grips 4-/5    Lower  Extremity Assessment Lower Extremity Assessment: Generalized weakness;RLE deficits/detail;LLE deficits/detail RLE Deficits / Details: MMT, modified in supine, bilaterally: 2+/5 hip flexion/extension (bridging for extension), 3/5 knee flexion/extension, 3/5 PF/DF LLE Deficits / Details: see above    Cervical / Trunk Assessment Cervical / Trunk Assessment: Kyphotic  Communication   Communication: No difficulties  Cognition Arousal/Alertness: Awake/alert Behavior During Therapy: WFL for tasks assessed/performed Overall Cognitive Status: Within Functional Limits for tasks assessed                                 General Comments: Pt A&O x4, requires increased time to respond to questions but responds/acts appropriately 100% of the time.      General Comments      Exercises General Exercises - Lower Extremity Ankle Circles/Pumps: AROM;Both;10 reps;Supine   Assessment/Plan    PT Assessment Patient needs continued PT services  PT Problem List Decreased activity tolerance;Decreased balance;Decreased mobility;Decreased strength;Decreased range of motion;Decreased knowledge of use of DME;Obesity;Cardiopulmonary status limiting activity;Pain       PT Treatment Interventions DME instruction;Gait training;Functional mobility training;Therapeutic activities;Therapeutic exercise;Balance training;Patient/family education    PT Goals (Current goals can be found in the Care Plan section)  Acute Rehab PT Goals Patient Stated Goal: do for myself PT Goal Formulation: With patient Time For Goal Achievement: 08/07/19 Potential to Achieve Goals: Fair    Frequency Min 2X/week   Barriers to discharge        Co-evaluation               AM-PAC PT "6 Clicks" Mobility  Outcome Measure Help needed turning from your back to your side while in a flat bed without using bedrails?: Total Help needed moving from lying on your back to sitting on the side of a flat bed without using  bedrails?: Total Help needed moving to and from a bed to a chair (including a wheelchair)?: Total Help needed standing up from a chair using your arms (e.g., wheelchair or bedside chair)?: Total Help needed to walk in hospital room?: Total Help needed climbing 3-5 steps with a railing? : Total 6 Click Score: 6    End of Session Equipment Utilized During Treatment: Oxygen Activity Tolerance: Patient limited by fatigue Patient left: with call bell/phone within reach;with family/visitor present;in bed;with SCD's reapplied(pt not to mobilize without RN assist, pt aware) Nurse Communication: Mobility status PT Visit Diagnosis: Muscle weakness (generalized) (M62.81);Other abnormalities of gait and mobility (R26.89)    Time: 1011-1040 PT Time Calculation (min) (ACUTE ONLY): 29 min   Charges:   PT Evaluation $PT Eval Low Complexity: 1 Low PT Treatments $Therapeutic Activity: 8-22 mins        Jiyaan Steinhauser E, PT Acute Rehabilitation Services Pager [phone]  Office [phone]   Isaack Preble D Taylar Hartsough 07/24/2019, 11:29 AM

## 2019-07-24 NOTE — Progress Notes (Signed)
Daily Progress Note   Patient Name: [name]       Date: 07/24/2019 DOB: Oct 25, 1934  Age: 84 y.o. MRN#: [id number] Attending Physician: Lorella Nimrod, MD Primary Care Physician: Javier Glazier, MD Admit Date: 07/19/2019  Reason for Consultation/Follow-up: Establishing goals of care  Subjective:  patient is sitting up in bed, still with high O2 requirements, daughter at bedside. Bedside RN also in the room.   Patient complains of alternatively feeling hot/cold. She has high BP systolic right now. At times, she feels flushed when she gets the IV Steroids.   Daughter at bedside is asking to speak with PCCM. See below.   Length of Stay: 5  Current Medications: Scheduled Meds:  . amLODipine  5 mg Oral Daily  . atorvastatin  40 mg Oral QHS  . chlorhexidine  15 mL Mouth Rinse BID  . Chlorhexidine Gluconate Cloth  6 each Topical Daily  . feeding supplement (GLUCERNA SHAKE)  237 mL Oral TID BM  . feeding supplement (PRO-STAT SUGAR FREE 64)  30 mL Oral BID  . fenofibrate  54 mg Oral Daily  . insulin aspart  0-20 Units Subcutaneous Q4H  . insulin glargine  40 Units Subcutaneous QHS  . levothyroxine  112 mcg Oral QAC breakfast  . mouth rinse  15 mL Mouth Rinse q12n4p  . methylPREDNISolone (SOLU-MEDROL) injection  80 mg Intravenous Q12H  . multivitamin with minerals  1 tablet Oral Daily  . mupirocin ointment  1 application Nasal BID  . pantoprazole  40 mg Oral BID AC  . sertraline  100 mg Oral QPM  . sodium chloride flush  3 mL Intravenous Q12H    Continuous Infusions: . sodium chloride 50 mL/hr at 07/24/19 1125  . sodium chloride      PRN Meds: sodium chloride, acetaminophen **OR** acetaminophen, albuterol, bisacodyl, hydrALAZINE, HYDROcodone-acetaminophen, ondansetron  **OR** ondansetron (ZOFRAN) IV, sodium chloride flush  Physical Exam         Weak appearing lady  Sitting up in bed She is on high flow She has dependent edema Abdomen is also distended Awake alert Diminished breath sounds On high flow O2  Vital Signs: BP (!) 180/52   Pulse 61   Temp 97.6 F (36.4 C) (Axillary)   Resp 20   Ht 5\' 1"  (1.549 m)   Wt 106.9  kg   LMP  (LMP Unknown)   SpO2 (!) 89%   BMI 44.53 kg/m  SpO2: SpO2: (!) 89 % O2 Device: O2 Device: High Flow Nasal Cannula O2 Flow Rate: O2 Flow Rate (L/min): 28 L/min  Intake/output summary:   Intake/Output Summary (Last 24 hours) at 07/24/2019 1201 Last data filed at 07/24/2019 0500 Gross per 24 hour  Intake 1171.04 ml  Output 2250 ml  Net -1078.96 ml   LBM: Last BM Date: 07/18/19 Baseline Weight: Weight: 105.7 kg Most recent weight: Weight: 106.9 kg       Palliative Assessment/Data:      Patient Active Problem List   Diagnosis Date Noted  . Pneumonitis 07/14/2019  . Pressure injury of skin 07/10/2019  . HCAP (healthcare-associated pneumonia)   . Acute hypoxemic respiratory failure ([place]) 07/03/2019  . Atypical atrial flutter ([place])   . Normocytic anemia 05/18/2017  . Chronic ITP (idiopathic thrombocytopenia) ([facility]) 01/25/2017  . Chronic kidney disease (CKD) stage G4/A1, severely decreased glomerular filtration rate (GFR) between 15-29 mL/min/1.73 square meter and albuminuria creatinine ratio less than 30 mg/g ([facility]) 09/04/2015  . Chronic kidney disease 09/04/2015  . Acute respiratory failure with hypoxia ([place]) 08/24/2015  . GOUT 06/23/2010  . DERMATITIS 01/16/2010  . BACK PAIN 06/13/2009  . MYALGIA 06/13/2009  . Anxiety state 12/13/2008  . LEG EDEMA, CHRONIC 11/08/2008  . Sleep apnea 10/26/2008  . Hypothyroidism 09/27/2008  . HYPERCHOLESTEROLEMIA 09/27/2008  . OBESITY 09/27/2008  . DEPRESSION 09/27/2008  . Essential hypertension 09/27/2008  . ALLERGIC RHINITIS 09/27/2008  . ASTHMA, CHILDHOOD  09/27/2008    Palliative Care Assessment & Plan   Patient Profile:  84 yo lady who lives at [facility], has 2 daughters, with the following active hospital issues: Recurrent acute on chronic hypoxic respiratory failure Diffuse pulmonary infiltrates of unclear etiology PAF (cardioverted and in NSR since Jun 26 2019) CRI (stage III) Mild systolic (EF 87-68%) and grade I diastolic HF Obesity   OSA H/o ITP ( ROMIPLOSTIM & FERAHEME) Chronic anemia  DM  Assessment:  generalized weakness Shortness of breath Functional decline recently High O2 requirements  Recommendations/Plan:  Goals of care discussions are ongoing with the patient and her 2 daughters. They wish to continue with current mode of care for now, they hope for ongoing workup/further direction of care from PCCM standpoint as to the final etiology of the patient's current condition.   I explained about pneumonitis, patient's generalized weakness and declining functional status to the best of my ability. Discussed frankly but compassionately that, if after a diligent continuation of current course of action, there is no meaningful improvement, then we will have to consider a more comfort-care focused mode of care that may also include seeking out hospice support. Family is aware and palliative will continue to follow.    Code Status:    Code Status Orders  (From admission, onward)         Start     Ordered   07/19/19 2131  Do not attempt resuscitation (DNR)  Continuous    Question Answer Comment  In the event of cardiac or respiratory ARREST Do not call a "code blue"   In the event of cardiac or respiratory ARREST Do not perform Intubation, CPR, defibrillation or ACLS   In the event of cardiac or respiratory ARREST Use medication by any route, position, wound care, and other measures to relive pain and suffering. May use oxygen, suction and manual treatment of airway obstruction as needed for comfort.  07/19/19 2130        Code Status History    Date Active Date Inactive Code Status Order ID Comments User Context   07/05/2019 1951 07/19/2019 0014 Partial Code [id number]  Kathi Ludwig, MD ED   07/03/2019 2358 07/05/2019 1951 Partial Code [id number]  Welford Roche, MD ED   07/03/2019 2112 07/03/2019 2358 DNR [id number]  Welford Roche, MD ED   08/24/2015 2143 08/29/2015 1750 Full Code [id number]  Toy Baker, MD Inpatient   Advance Care Planning Activity    Advance Directive Documentation     Most Recent Value  Type of Advance Directive  Living will, Healthcare Power of Attorney  Pre-existing out of facility DNR order (yellow form or pink MOST form)  Yellow form placed in chart (order not valid for inpatient use)  "MOST" Form in Place?  -       Prognosis:   guarded   Discharge Planning:  To Be Determined  Care plan was discussed with  Patient, RN, daughter, [facility] MD.  Briefly touched base with PCCM colleague Ms Alfredo Martinez NP as well.   Thank you for allowing the Palliative Medicine Team to assist in the care of this patient.   Time In: 11 Time Out: 11.35 Total Time 35 Prolonged Time Billed  no       Greater than 50%  of this time was spent counseling and coordinating care related to the above assessment and plan.  Sara Chance, MD  Please contact Palliative Medicine Team phone at [phone] for questions and concerns.

## 2019-07-24 NOTE — Progress Notes (Signed)
NAME:  [name], MRN:  [id number], DOB:  1935-03-16, LOS: 5 ADMISSION DATE:  07/19/2019, CONSULTATION DATE:  11/12 REFERRING MD:  Roel Cluck, CHIEF COMPLAINT:  Recurrent respiratory failure   Brief History    84 y/o female was admitted on 11/11 with recurrent hypoxemic/hypercarbic respiratory failure requiring Bipap support in setting of diffuse pulmonary infiltrates of unknown origin s/p cardioversion 06/26/19 s cxr prior to procedure but denied sob prior (extremely sedentary however)  She underwent elective cardioversion for chronic AF on 10/19 and was discharged on 2 L O2.  She was admitted 10/26-11/10 for acute hypoxic respiratory failure with bilateral infiltrates, felt to be pneumonitis of unclear etiology.  Serology negative.  Discharged on 6 L oxygen and prednisone taper  Past Medical History  CKD Stage 3, Anemia of chronic disease,Atrial fibrillation on Elaquis, s/p cardioversion,OSA on CPAP,HFpEF ,HLD,Type II DM HTN,Hypothyroidism,Chronic ITP,PE 2010  Significant Hospital Events   Stopped eliquis 11/13 due to concern with hemoptysis report and ? alv hem on cxr/ ct  Consults:  11/12 >> PCCM  Procedures:   Echo 07/05/19 (p onset of sob/ infiltrates: Grade I diastolic dysfunction with Mild LAE, nl RA  Significant Diagnostic Tests:   10/27 CT Chest >> Widespread ground-glass opacities, small bilat pleural effusions   Micro Data:  11/10 >> Sars-CoV2 neg 11/10 >> MRSA PCR pos 11/11  Urine strep neg 11/11  Urine legionella neg   Note PCT  11/15  Neg    Antimicrobials:  11/11 Vanco   11/11 Cefepime   11/11 Azithromycin   Interim history/subjective:   Transition off BiPAP this morning but more hypoxic requiring 30 L high flow and FiO2 80% She feels okay  Objective   Blood pressure (!) 157/43, pulse (!) 59, temperature 97.6 F (36.4 C), temperature source Axillary, resp. rate (!) 21, height _0  (1.549 m), weight 106.9 kg, SpO2 (!) 86 %.    FiO2 (%):  [50  %-90 %] 80 %   Intake/Output Summary (Last 24 hours) at 07/24/2019 0943 Last data filed at 07/24/2019 0500 Gross per 24 hour  Intake 1411.04 ml  Output 2250 ml  Net -838.96 ml  sats 94% on 50% Filed Weights   07/20/19 0013 07/23/19 0337 07/24/19 0500  Weight: 105.7 kg 109 kg 106.9 kg    Examination: Obese elderly, chronically ill-appearing wf nad on high flow, no distress No jvd Oropharynx -dry Neck supple Lungs with distant bs, a few crackles  bilaterally RRR no s3 or or sign murmur Abd obese with limited excursion  Extr warm with no edema or clubbing noted Neuro alert, interactive, recognize me  Chest x-ray personally reviewed which shows mild improvement in right upper lobe aeration, cardiomegaly and vascular congestion with bilateral effusions Assessment & Plan:   Recurrent acute on chronic hypoxic respiratory failure Diffuse pulmonary infiltrates of unclear etiology PAF (cardioverted and in NSR since Jun 26 2019) CRI (stage III) Mild systolic (EF 37-10%) and grade I diastolic HF Obesity   OSA H/o ITP ( ROMIPLOSTIM & FERAHEME) Chronic anemia  DM  Pulmonary problem list:  Recurrent acute on chronic hypoxemic  respiratory failure in setting of diffuse patchy infiltrates.  DD  Infection w/u in progress> Note PCT  11/15  Neg  And all cultures neg  Drug  - no record of any of the usual suspects (chemo, amiodarone/nitrofurantoin)  Serology negative for Connective tissue dz/ BOOP > esr 120 prior to steroids suports   Hemorrhage on eliquis > d/c'd 11/13     PLAN:  Continue Solu-Medrol 80 every 12 Consider d/c all abx   OSA/OHS-maintained on CPAP at home prior to acute illness, continue BiPAP during sleep  Kara Mead MD. FCCP. [facility]  If no response to pager , please call [phone]   07/24/2019

## 2019-07-24 NOTE — Progress Notes (Signed)
PROGRESS NOTE    Sara VIGNA  [name]:[id number] DOB: 1935-01-12 DOA: 07/19/2019 PCP: Javier Glazier, MD   Brief Narrative:  [name] a 84 y.o.femalewith medical history significant of chronic ITP, CKD stage3, anemia of chronic disease, A.fib on Eliquis,sleep apnea on CPAP history of CHF HLD history of pulmonary embolism in 2010 diabetes type 2, HTN,hypothyroidism, pressure ulcer Presented withworsening shortness of breathfromSNF. Multiple recent admissions in October, most recentlyshe was discharged to SNF 1 day ago from [facility] where she was treated for acute respiratory failure and thought to be due to pneumonitis and was sent home with tapering dose of steroid. She was having increased oxygen requirement, initially managed with nonrebreather. BiPAP was started later due to worsening respiratory status. Chest x-ray with worsening of bilateral infiltrate, broader differential which includes pneumonitis, bilateral pneumonia, ARDS, pulmonary vascular congestion. She was started on broad-spectrum antibiotics,Lasix and steroids. PCCM is on board and does not think that there is much to offer for her.  Subjective: Patient was having chills when seen this morning.  She did had some headache earlier and was given first Tylenol then Norco.  Patient appears little anxious.  Both daughters were at bedside and was concerned about deterioration of her condition.  Per daughters when they came in this morning she was sitting on the chair and working with physical therapist and feeling better.  Assessment & Plan:   Principal Problem:   Acute respiratory failure with hypoxia (HCC) Active Problems:   Hypothyroidism   HYPERCHOLESTEROLEMIA   OBESITY   Essential hypertension   Chronic kidney disease (CKD) stage G4/A1, severely decreased glomerular filtration rate (GFR) between 15-29 mL/min/1.73 square meter and albuminuria creatinine ratio less than 30 mg/g (HCC)   Chronic  kidney disease   Chronic ITP (idiopathic thrombocytopenia) (HCC)   Normocytic anemia   Atypical atrial flutter (HCC)   HCAP (healthcare-associated pneumonia)   Pneumonitis   Shortness of breath   Palliative care by specialist   Goals of care, counseling/discussion  Acute on chronic hypoxic and hypercapnic respiratory failure. Differential continue to remain broad as there is no definitive diagnosis at this time. ARDS/pneumonitis/aspiration/pulmonary edema/PE. COVID-19 infection unlikely as negative PCR and no fever. PE unlikely as patient is on DOAC. Aspiration/bilateral pneumonia can be a possibility although patient is afebrile-we will continue broad-spectrum antibiotics with cefepime, vancomycin and azithromycin. BNP was not very impressive for heart failure,not much response to diuresis.ESR is not very impressive either but patient was on steroid.  She had one episode of small amount of hemoptysis yesterday. -Patient was on high-dose steroids which was decreased to Solu-Medrol 80 mg twice daily by PCCM yesterday. -Calcitonin check twice was less than 0.10 which shows discontinuation of antibiotics.  I will discontinue cefepime and vancomycin.  We will continue azithromycin for today as it will complete 5 days course. -Repeat chest x-ray with worsening of bilateral infiltrate.  ARDS/pulmonary edema/atypical infection which is unlikely.  Most likely worsening of her parenchymal lung disease.  Discussed with Dr. Karl Bales from PCCM, according to him it looks like Boop versus post viral pneumonitis, not much to offer except continuation of steroid. He also advised that we will not diurese today as patient appears dry and no antibiotics at this time. -Palliative consult.-Will appreciate their help. -Very difficult situation, apparently poor prognosis.  Discussed in detail with both daughters in person, other on phone, they wants to take some time to make further decision regarding her goals of  care.  Hypothermia.  Nursing staff  unable to obtain peripheral temperature with her complaints of chills.  She was having 96.4 on rectal temperature check. -Obtain blood culture. -Bear hugger. -Keep monitoring.  Hypertension.   Blood pressure continues to rise again today. -Increase amlodipine to 10 mg daily. -Increase the dose of hydralazine to 10 mg as needed per systolic blood pressure above 180.  AKI with CKD.  Slight improvement in renal function. -We will try 1 dose of Lasix 40 mg IV and continue gentle hydration. -Continue monitoring renal function. -Avoid nephrotoxic.  Hypernatremia.  sodium, 149 today. -Continue gentle hydration with half saline.  Cannot give D5 because of hyperglycemia. -Monitor electrolytes  Diastolic heart failure.  Repeat BNP slightly more than admission but remains unimpressive.  Clinically appears dry with weight down today. -Continue with strict ins and outs and daily weights.  Chronic ITP.Patient recently received 2 doses of Nplate and Aranesp during previous hospitalization. Under care of Dr. Marin Olp. Oncology is following-we appreciate their recommendations. Platelets started dropping today, it was 146 instead of 203 yesterday. -Solu-Medrol dose was decreased to 80 mg twice daily today.  Leukocytosis.  Most likely secondary to steroid.    Type 2 diabetes. A1c 7.3 on 07/19/2019. Remains hyperglycemic due to high-dose steroids. -Convert SSI to resistant scale. -Keep monitoring.  Atypical atrial flutter.Currently in sinus rhythm with history of recent cardioversion on 06/26/2019. -Eliquis was discontinued 11/13 due to hemoptysis.  Hypothyroidism. TSH 0.838 on 07/04/2019. -Continue home dose of Synthroid.  OSA.On nocturnal CPAP at home, currently on BiPAP.  Objective: Vitals:   07/24/19 0900 07/24/19 1000 07/24/19 1100 07/24/19 1228  BP: (!) 157/43 140/67 (!) 180/52   Pulse: (!) 59 61 61   Resp: (!) '21 20 20   '$ Temp:    (!)  96.4 F (35.8 C)  TempSrc:    Rectal  SpO2: (!) 86% 97% (!) 89%   Weight:      Height:        Intake/Output Summary (Last 24 hours) at 07/24/2019 1231 Last data filed at 07/24/2019 0500 Gross per 24 hour  Intake 1171.04 ml  Output 2250 ml  Net -1078.96 ml   Filed Weights   07/20/19 0013 07/23/19 0337 07/24/19 0500  Weight: 105.7 kg 109 kg 106.9 kg    Examination:  General exam: Patient appears little anxious and wrapped with blankets as feeling chills. Respiratory system: Bilateral scattered crackles, decreased breath sound at bases, respiratory effort normal. Cardiovascular system: S1 & S2 heard, RRR. No JVD, murmurs, rubs, gallops or clicks.  No pedal edema. Gastrointestinal system: Abdomen is nondistended, soft and nontender. No organomegaly or masses felt. Normal bowel sounds heard. Central nervous system: Alert and oriented. No focal neurological deficits. Extremities: Symmetric 5 x 5 power. Skin: Multiple upper extremity ecchymosis. Psychiatry: Judgement and insight appear normal. Mood & affect appropriate.   DVT prophylaxis: SCDs Code Status: DNR Family Communication: Both daughters were updated in person  Disposition Plan: Pending improvement/goals of care discussion. Prognosis seems poor.  Consultants:   PCCM  Oncology  Procedures:   Antimicrobials:  Azithromycin day 5. Stopped vancomycin and cefepime.  Data Reviewed: I have personally reviewed following labs and imaging studies  CBC: Recent Labs  Lab 07/19/19 1615 07/20/19 0145 07/21/19 0243 07/22/19 0204 07/23/19 0242 07/24/19 0237  WBC 15.4* 11.0* 11.1* 14.5* 15.2* 10.0  NEUTROABS 14.0*  --   --   --   --   --   HGB 10.3* 8.1* 8.4* 8.8* 8.8* 8.2*  HCT 34.3* 27.1* 28.0* 30.0* 30.9* 28.6*  MCV 95.8 97.5 97.6 99.3 102.3* 102.9*  PLT 224 156 187 214 203 381*   Basic Metabolic Panel: Recent Labs  Lab 07/20/19 0145 07/21/19 0243 07/22/19 0204 07/23/19 0242 07/23/19 1421 07/24/19 0237   NA 137 147* 150* 152* 147* 149*  K 4.8 4.9 4.5 4.4 4.7 4.5  CL 90* 97* 101 105 102 103  CO2 36* 37* 37* 38* 35* 38*  GLUCOSE 268* 280* 290* 203* 321* 196*  BUN 102* 105* 93* 96* 97* 98*  CREATININE 1.94* 2.16* 2.29* 1.96*  1.81* 2.02* 2.03*  CALCIUM 9.1 9.7 9.8 9.8 9.6 9.1  MG 2.3  --   --   --   --   --   PHOS 5.3* 5.0*  --  4.6  --  4.8*   GFR: Estimated Creatinine Clearance: 24.5 mL/min (A) (by C-G formula based on SCr of 2.03 mg/dL (H)). Liver Function Tests: Recent Labs  Lab 07/20/19 0145 07/21/19 0243 07/23/19 0242 07/24/19 0237  AST 19  --   --   --   ALT 18  --   --   --   ALKPHOS 38  --   --   --   BILITOT 1.0  --   --   --   PROT 6.0*  --   --   --   ALBUMIN 3.2* 3.2* 3.5 3.2*   No results for input(s): LIPASE, AMYLASE in the last 168 hours. No results for input(s): AMMONIA in the last 168 hours. Coagulation Profile: No results for input(s): INR, PROTIME in the last 168 hours. Cardiac Enzymes: No results for input(s): CKTOTAL, CKMB, CKMBINDEX, TROPONINI in the last 168 hours. BNP (last 3 results) No results for input(s): PROBNP in the last 8760 hours. HbA1C: No results for input(s): HGBA1C in the last 72 hours. CBG: Recent Labs  Lab 07/23/19 1658 07/23/19 1941 07/23/19 2319 07/24/19 0330 07/24/19 0803  GLUCAP 279* 221* 234* 165* 91   Lipid Profile: No results for input(s): CHOL, HDL, LDLCALC, TRIG, CHOLHDL, LDLDIRECT in the last 72 hours. Thyroid Function Tests: No results for input(s): TSH, T4TOTAL, FREET4, T3FREE, THYROIDAB in the last 72 hours. Anemia Panel: No results for input(s): VITAMINB12, FOLATE, FERRITIN, TIBC, IRON, RETICCTPCT in the last 72 hours. Sepsis Labs: Recent Labs  Lab 07/19/19 2214 07/22/19 1446 07/23/19 0242 07/24/19 0237  PROCALCITON <0.10 <0.10 <0.10 0.16    Recent Results (from the past 240 hour(s))  SARS CORONAVIRUS 2 (TAT 6-24 HRS) Nasopharyngeal Nasopharyngeal Swab     Status: None   Collection Time: 07/18/19  10:11 AM   Specimen: Nasopharyngeal Swab  Result Value Ref Range Status   SARS Coronavirus 2 NEGATIVE NEGATIVE Final    Comment: (NOTE) SARS-CoV-2 target nucleic acids are NOT DETECTED. The SARS-CoV-2 RNA is generally detectable in upper and lower respiratory specimens during the acute phase of infection. Negative results do not preclude SARS-CoV-2 infection, do not rule out co-infections with other pathogens, and should not be used as the sole basis for treatment or other patient management decisions. Negative results must be combined with clinical observations, patient history, and epidemiological information. The expected result is Negative. Fact Sheet for Patients: SugarRoll.be Fact Sheet for Healthcare Providers: https://www.woods-mathews.com/ This test is not yet approved or cleared by the [place] FDA and  has been authorized for detection and/or diagnosis of SARS-CoV-2 by FDA under an Emergency Use Authorization (EUA). This EUA will remain  in effect (meaning this test can be used) for the duration of the COVID-19 declaration under  Section 56 4(b)(1) of the Act, 21 U.S.C. section 360bbb-3(b)(1), unless the authorization is terminated or revoked sooner. Performed at [facility], [place] [address], [place], [place] [address]   MRSA PCR Screening     Status: Abnormal   Collection Time: 07/19/19  9:31 PM   Specimen: Nasal Mucosa; Nasopharyngeal  Result Value Ref Range Status   MRSA by PCR POSITIVE (A) NEGATIVE Final    Comment:        The GeneXpert MRSA Assay (FDA approved for NASAL specimens only), is one component of a comprehensive MRSA colonization surveillance program. It is not intended to diagnose MRSA infection nor to guide or monitor treatment for MRSA infections. RESULT CALLED TO, READ BACK BY AND VERIFIED WITH: ROTHERMEL,A @ 8242 PN [id number] BY POTEAT,S Performed at [facility], [place]  [address], [place], [place] [address]      Radiology Studies: Dg Chest Port 1 View  Result Date: 07/24/2019 CLINICAL DATA:  Shortness of breath EXAM: PORTABLE CHEST 1 VIEW July 22, 2019 FINDINGS: There is cardiomegaly with pulmonary venous hypertension. There are pleural effusions bilaterally. There is airspace consolidation in each lung base region, more on the left than on the right. There is also interstitial edema which is similar to recent prior study. There is slightly less airspace opacity in the right upper lobe compared to most recent study. No new opacity is evident. There is aortic atherosclerosis.  No adenopathy.  No bone lesions. IMPRESSION: Cardiomegaly with pulmonary vascular congestion. Persistent pleural effusions. Interstitial edema is present with airspace consolidation in the lung bases. Question alveolar edema versus pneumonia in the bases. Both entities may exist concurrently. There is less airspace opacity in the right upper lobe compared to most recent study. No new opacity evident on either side. Overall appearance is felt to be most indicative of congestive heart failure. A degree of superimposed ARDS is possible. Aortic Atherosclerosis (ICD10-I70.0). Electronically Signed   By: Lowella Grip III M.D.   On: 07/24/2019 07:57   Dg Chest Port 1 View  Result Date: 07/22/2019 CLINICAL DATA:  Shortness of breath and respiratory failure. EXAM: PORTABLE CHEST 1 VIEW COMPARISON:  07/19/2019 FINDINGS: Stable cardiac enlargement. Overall degree of diffuse pulmonary edema/ARDS appears slightly worse compared to the prior chest x-ray. No pneumothorax. Probable small bilateral pleural effusions. IMPRESSION: Slight worsening of diffuse pulmonary edema/ARDS since the prior chest x-ray. Probable small bilateral pleural effusions. Electronically Signed   By: Aletta Edouard M.D.   On: 07/22/2019 14:54    Scheduled Meds: . [START ON 07/25/2019] amLODipine  10 mg Oral Daily  .  atorvastatin  40 mg Oral QHS  . chlorhexidine  15 mL Mouth Rinse BID  . Chlorhexidine Gluconate Cloth  6 each Topical Daily  . feeding supplement (GLUCERNA SHAKE)  237 mL Oral TID BM  . feeding supplement (PRO-STAT SUGAR FREE 64)  30 mL Oral BID  . fenofibrate  54 mg Oral Daily  . insulin aspart  0-20 Units Subcutaneous Q4H  . insulin glargine  40 Units Subcutaneous QHS  . levothyroxine  112 mcg Oral QAC breakfast  . mouth rinse  15 mL Mouth Rinse q12n4p  . methylPREDNISolone (SOLU-MEDROL) injection  80 mg Intravenous Q12H  . multivitamin with minerals  1 tablet Oral Daily  . mupirocin ointment  1 application Nasal BID  . pantoprazole  40 mg Oral BID AC  . sertraline  100 mg Oral QPM  . sodium chloride flush  3 mL Intravenous Q12H  Continuous Infusions: . sodium chloride 50 mL/hr at 07/24/19 1125  . sodium chloride       LOS: 5 days   Time spent: 40 minutes  Lorella Nimrod, MD [facility] Pager [phone]  If 7PM-7AM, please contact night-coverage [email or web address] Password [facility] 07/24/2019, 12:31 PM   This record has been created using Systems analyst. Errors have been sought and corrected,but may not always be located. Such creation errors do not reflect on the standard of care.

## 2019-07-25 LAB — RENAL FUNCTION PANEL
Albumin: 3.1 g/dL — ABNORMAL LOW (ref 3.5–5.0)
Anion gap: 6 (ref 5–15)
BUN: 91 mg/dL — ABNORMAL HIGH (ref 8–23)
CO2: 36 mmol/L — ABNORMAL HIGH (ref 22–32)
Calcium: 9.4 mg/dL (ref 8.9–10.3)
Chloride: 107 mmol/L (ref 98–111)
Creatinine, Ser: 1.94 mg/dL — ABNORMAL HIGH (ref 0.44–1.00)
GFR calc Af Amer: 27 mL/min — ABNORMAL LOW (ref >60)
GFR calc non Af Amer: 24 mL/min — ABNORMAL LOW (ref >60)
Glucose, Bld: 142 mg/dL — ABNORMAL HIGH (ref 70–99)
Phosphorus: 4.2 mg/dL (ref 2.5–4.6)
Potassium: 5 mmol/L (ref 3.5–5.1)
Sodium: 149 mmol/L — ABNORMAL HIGH (ref 135–145)

## 2019-07-25 LAB — GLUCOSE, CAPILLARY
Glucose-Capillary: 112 mg/dL — ABNORMAL HIGH (ref 70–99)
Glucose-Capillary: 92 mg/dL (ref 70–99)
Glucose-Capillary: 169 mg/dL — ABNORMAL HIGH (ref 70–99)
Glucose-Capillary: 151 mg/dL — ABNORMAL HIGH (ref 70–99)
Glucose-Capillary: 223 mg/dL — ABNORMAL HIGH (ref 70–99)
Glucose-Capillary: 210 mg/dL — ABNORMAL HIGH (ref 70–99)

## 2019-07-25 LAB — CBC
HCT: 28.2 % — ABNORMAL LOW (ref 36.0–46.0)
Hemoglobin: 8.1 g/dL — ABNORMAL LOW (ref 12.0–15.0)
MCH: 29.2 pg (ref 26.0–34.0)
MCHC: 28.7 g/dL — ABNORMAL LOW (ref 30.0–36.0)
MCV: 101.8 fL — ABNORMAL HIGH (ref 80.0–100.0)
Platelets: 152 10*3/uL (ref 150–400)
RBC: 2.77 MIL/uL — ABNORMAL LOW (ref 3.87–5.11)
RDW: 21.2 % — ABNORMAL HIGH (ref 11.5–15.5)
WBC: 10.3 10*3/uL (ref 4.0–10.5)
nRBC: 0 % (ref 0.0–0.2)

## 2019-07-25 MED ORDER — ROMIPLOSTIM 250 MCG ~~LOC~~ SOLR
1.0000 ug/kg | Freq: Once | SUBCUTANEOUS | Status: AC
  Administered 2019-07-25: 105 ug via SUBCUTANEOUS
  Filled 2019-07-25: qty 0.21

## 2019-07-25 NOTE — Progress Notes (Signed)
NAME:  [name], MRN:  [id number], DOB:  April 24, 1935, LOS: 6 ADMISSION DATE:  07/19/2019, CONSULTATION DATE:  11/12 REFERRING MD:  Roel Cluck, CHIEF COMPLAINT:  Recurrent respiratory failure   Brief History    84 y/o female was admitted on 11/11 with recurrent hypoxemic/hypercarbic respiratory failure requiring Bipap support in setting of diffuse pulmonary infiltrates of unknown origin  She underwent elective cardioversion for chronic AF on 10/19 and was discharged on 2 L O2.  She was admitted 10/26-11/10 for acute hypoxic respiratory failure with bilateral infiltrates, felt to be pneumonitis of unclear etiology.  Serology negative.  Discharged on 6 L oxygen and prednisone taper  Past Medical History  CKD Stage 3, Anemia of chronic disease,Atrial fibrillation on Elaquis, s/p cardioversion,OSA on CPAP,HFpEF ,HLD,Type II DM HTN,Hypothyroidism,Chronic ITP,PE 2010  Significant Hospital Events   Stopped eliquis 11/13 due to concern with hemoptysis report and ? alv hem on cxr/ ct  Consults:  11/12 >> PCCM  Procedures:   Echo 07/05/19 (p onset of sob/ infiltrates: Grade I diastolic dysfunction with Mild LAE, nl RA  Significant Diagnostic Tests:   10/27 CT Chest >> Widespread ground-glass opacities, small bilat pleural effusions   Micro Data:  11/10 >> Sars-CoV2 neg 11/10 >> MRSA PCR pos 11/11  Urine strep neg 11/11  Urine legionella neg   Note PCT  11/15  Neg    Antimicrobials:  11/11 Vanco  >>11/15 11/11 Cefepime  >>11/15 11/11 Azithromycin >> 11/15  Interim history/subjective:   Stayed on high flow 30 L, 80% during the daytime and on BiPAP 50% overnight Mildly hypothermic overnight  Objective   Blood pressure (!) 162/44, pulse 60, temperature (!) 96.8 F (36 C), temperature source Axillary, resp. rate 18, height _0  (1.549 m), weight 106.3 kg, SpO2 96 %.    FiO2 (%):  [50 %-80 %] 50 %   Intake/Output Summary (Last 24 hours) at 07/25/2019 0946 Last data filed at  07/25/2019 9833 Gross per 24 hour  Intake 1326.13 ml  Output 1250 ml  Net 76.13 ml  sats 94% on 50% Filed Weights   07/23/19 0337 07/24/19 0500 07/25/19 0441  Weight: 109 kg 106.9 kg 106.3 kg    Examination: Obese elderly, chronically ill-appearing wf nad on bipap FF mask , no distress No jvd Oropharynx -dry Neck supple Lungs with distant bs, a few crackles  bilaterally RRR no s3 or or sign murmur Abd obese with limited excursion  Extr warm with no edema or clubbing noted Neuro alert, interactive, recognize me  Chest x-ray 11/16 personally reviewed which shows mild improvement in right upper lobe aeration, cardiomegaly and vascular congestion with bilateral effusions Assessment & Plan:   Recurrent acute on chronic hypoxic respiratory failure Diffuse pulmonary infiltrates of unclear etiology PAF (cardioverted and in NSR since Jun 26 2019) CRI (stage III) Mild systolic (EF 82-50%) and grade I diastolic HF Obesity   OSA H/o ITP ( ROMIPLOSTIM & FERAHEME) Chronic anemia  DM  Pulmonary problem list:  Recurrent acute on chronic hypoxemic  respiratory failure in setting of diffuse patchy infiltrates.  DD  No e/o Infection > Note PCT  11/15  Neg  And all cultures neg  Drug  - no record of any of the usual suspects (chemo, amiodarone/nitrofurantoin)  Serology negative for Connective tissue dz/ BOOP > esr 120 prior to steroids  Hemorrhage on eliquis > d/c'd 11/13  Moderate pulmonary hypertension on echo    PLAN: Continue Solu-Medrol 80 every 12 Off abx  Continue high flow  daytime and BiPAP nocturnal-attempt to lower FiO2 maintaining saturation 92% and above Unfortunately not a candidate for bronchoscopy or surgical biopsy  OSA/OHS-maintained on CPAP at home prior to acute illness, continue BiPAP during sleep  Steroid-induced hyperglycemia-Per primary  Updated daughter 11/16 in detail  Kara Mead MD. [facility]. [facility]  If no response to pager ,  please call [phone]   07/25/2019

## 2019-07-25 NOTE — Progress Notes (Signed)
Daily Progress Note   Patient Name: [name]       Date: 07/25/2019 DOB: 07-Jul-1935  Age: 84 y.o. MRN#: [id number] Attending Physician: Lorella Nimrod, MD Primary Care Physician: Javier Glazier, MD Admit Date: 07/19/2019  Reason for Consultation/Follow-up: Establishing goals of care  Subjective:  Daughters at bedside today.  Patient sleepy and does not really participate in conversation.  Discussed clinical course and struggle that daughters are having emotionally as she still is with unclear diagnosis and prognosis.  They understand that she has high chance of continued decompensation and we talked about possible pathways forward for her care depending on how she progresses over the next day or two.   Length of Stay: 6  Current Medications: Scheduled Meds:  . amLODipine  10 mg Oral Daily  . atorvastatin  40 mg Oral QHS  . chlorhexidine  15 mL Mouth Rinse BID  . Chlorhexidine Gluconate Cloth  6 each Topical Daily  . feeding supplement (GLUCERNA SHAKE)  237 mL Oral TID BM  . feeding supplement (PRO-STAT SUGAR FREE 64)  30 mL Oral BID  . fenofibrate  54 mg Oral Daily  . insulin aspart  0-20 Units Subcutaneous Q4H  . insulin glargine  40 Units Subcutaneous QHS  . levothyroxine  112 mcg Oral QAC breakfast  . mouth rinse  15 mL Mouth Rinse q12n4p  . methylPREDNISolone (SOLU-MEDROL) injection  80 mg Intravenous Q12H  . multivitamin with minerals  1 tablet Oral Daily  . pantoprazole  40 mg Oral BID AC  . sertraline  100 mg Oral QPM  . sodium chloride flush  3 mL Intravenous Q12H    Continuous Infusions: . sodium chloride      PRN Meds: sodium chloride, acetaminophen **OR** acetaminophen, albuterol, bisacodyl, hydrALAZINE, HYDROcodone-acetaminophen, ondansetron **OR**  ondansetron (ZOFRAN) IV, sodium chloride, sodium chloride flush  Physical Exam          Sleeping while sitting in bed She has dependent edema Abdomen is also distended Diminished breath sounds On Bipap  Vital Signs: BP (!) 160/45   Pulse 70   Temp 98 F (36.7 C) (Axillary)   Resp 20   Ht 5\' 1"  (1.549 m)   Wt 106.3 kg   LMP  (LMP Unknown)   SpO2 99%   BMI 44.28 kg/m  SpO2: SpO2: 99 %  O2 Device: O2 Device: Bi-PAP O2 Flow Rate: O2 Flow Rate (L/min): 25 L/min  Intake/output summary:   Intake/Output Summary (Last 24 hours) at 07/25/2019 2258 Last data filed at 07/25/2019 2253 Gross per 24 hour  Intake 1650.73 ml  Output 1000 ml  Net 650.73 ml   LBM: Last BM Date: 07/18/19 Baseline Weight: Weight: 105.7 kg Most recent weight: Weight: 106.3 kg       Palliative Assessment/Data:      Patient Active Problem List   Diagnosis Date Noted  . Shortness of breath   . Palliative care by specialist   . Goals of care, counseling/discussion   . Pneumonitis 07/14/2019  . Pressure injury of skin 07/10/2019  . HCAP (healthcare-associated pneumonia)   . Acute hypoxemic respiratory failure ([place]) 07/03/2019  . Atypical atrial flutter ([employer])   . Normocytic anemia 05/18/2017  . Chronic ITP (idiopathic thrombocytopenia) (HCC) 01/25/2017  . Chronic kidney disease (CKD) stage G4/A1, severely decreased glomerular filtration rate (GFR) between 15-29 mL/min/1.73 square meter and albuminuria creatinine ratio less than 30 mg/g (HCC) 09/04/2015  . Chronic kidney disease 09/04/2015  . Acute respiratory failure with hypoxia ([place]) 08/24/2015  . GOUT 06/23/2010  . DERMATITIS 01/16/2010  . BACK PAIN 06/13/2009  . MYALGIA 06/13/2009  . Anxiety state 12/13/2008  . LEG EDEMA, CHRONIC 11/08/2008  . Sleep apnea 10/26/2008  . Hypothyroidism 09/27/2008  . HYPERCHOLESTEROLEMIA 09/27/2008  . OBESITY 09/27/2008  . DEPRESSION 09/27/2008  . Essential hypertension 09/27/2008  . ALLERGIC RHINITIS  09/27/2008  . ASTHMA, CHILDHOOD 09/27/2008    Palliative Care Assessment & Plan   Patient Profile:  84 yo lady who lives at [facility], has 2 daughters, with the following active hospital issues: Recurrent acute on chronic hypoxic respiratory failure Diffuse pulmonary infiltrates of unclear etiology PAF (cardioverted and in NSR since Jun 26 2019) CRI (stage III) Mild systolic (EF 08-67%) and grade I diastolic HF Obesity   OSA H/o ITP ( ROMIPLOSTIM & FERAHEME) Chronic anemia  DM  Assessment:  generalized weakness Shortness of breath Functional decline recently High O2 requirements  Recommendations/Plan:  Goals of care discussions are ongoing with the patient and her 2 daughters. They wish to continue with current mode of care for now.  Patient herself sleepy today and did not participate in conversation.    Her daughters understand that if she declines or has no meaningful improvement, then we will have to consider a more comfort-care focused mode of care that may also include seeking out hospice support. Family is aware and palliative will continue to follow.    Code Status:    Code Status Orders  (From admission, onward)         Start     Ordered   07/19/19 2131  Do not attempt resuscitation (DNR)  Continuous    Question Answer Comment  In the event of cardiac or respiratory ARREST Do not call a "code blue"   In the event of cardiac or respiratory ARREST Do not perform Intubation, CPR, defibrillation or ACLS   In the event of cardiac or respiratory ARREST Use medication by any route, position, wound care, and other measures to relive pain and suffering. May use oxygen, suction and manual treatment of airway obstruction as needed for comfort.      07/19/19 2130        Code Status History    Date Active Date Inactive Code Status Order ID Comments User Context   07/05/2019 1951 07/19/2019 0014 Partial Code [id number]  Kathi Ludwig, MD ED    07/03/2019 2358 07/05/2019 1951 Partial Code [id number]  Welford Roche, MD ED   07/03/2019 2112 07/03/2019 2358 DNR [id number]  Welford Roche, MD ED   08/24/2015 2143 08/29/2015 1750 Full Code [id number]  Toy Baker, MD Inpatient   Advance Care Planning Activity    Advance Directive Documentation     Most Recent Value  Type of Advance Directive  Living will, Healthcare Power of Attorney  Pre-existing out of facility DNR order (yellow form or pink MOST form)  Yellow form placed in chart (order not valid for inpatient use)  "MOST" Form in Place?  -       Prognosis:   guarded   Discharge Planning:  To Be Determined  Care plan was discussed with  Patient, RN, daughters, TRH MD.   Thank you for allowing the Palliative Medicine Team to assist in the care of this patient.   Time In: 1200 Time Out: 1240 Total Time 40 Prolonged Time Billed  no       Greater than 50%  of this time was spent counseling and coordinating care related to the above assessment and plan.  Micheline Rough, MD  Please contact Palliative Medicine Team phone at [phone] for questions and concerns.

## 2019-07-25 NOTE — Progress Notes (Signed)
PROGRESS NOTE    [name]  CMK:[id number] DOB: Oct 26, 1934 DOA: 07/19/2019 PCP: Javier Glazier, MD   Brief Narrative:  [name] a 84 y.o.femalewith medical history significant of chronic ITP, CKD stage3, anemia of chronic disease, A.fib on Eliquis,sleep apnea on CPAP history of CHF HLD history of pulmonary embolism in 2010 diabetes type 2, HTN,hypothyroidism, pressure ulcer Presented withworsening shortness of breathfromSNF. Multiple recent admissions in October, most recentlyshe was discharged to SNF 1 day ago from [facility] where she was treated for acute respiratory failure and thought to be due to pneumonitis and was sent home with tapering dose of steroid. She was having increased oxygen requirement, initially managed with nonrebreather. BiPAP was started later due to worsening respiratory status. Chest x-ray with worsening of bilateral infiltrate, broader differential which includes pneumonitis, bilateral pneumonia, ARDS, pulmonary vascular congestion. She was started on broad-spectrum antibiotics,Lasix and steroids. Antibiotics were discontinued after having normal procalcitonin multiple times. Continue steroid and give Lasix as needed.  Currently appears dry. PCCM is on board and does not think that there is much to offer for her.  Subjective: Patient was sleeping comfortably with bear hugger and BiPAP.  Easily arousable and no new complaints.  Assessment & Plan:   Principal Problem:   Acute respiratory failure with hypoxia (HCC) Active Problems:   Hypothyroidism   HYPERCHOLESTEROLEMIA   OBESITY   Essential hypertension   Chronic kidney disease (CKD) stage G4/A1, severely decreased glomerular filtration rate (GFR) between 15-29 mL/min/1.73 square meter and albuminuria creatinine ratio less than 30 mg/g (HCC)   Chronic kidney disease   Chronic ITP (idiopathic thrombocytopenia) (HCC)   Normocytic anemia   Atypical atrial flutter (HCC)   HCAP  (healthcare-associated pneumonia)   Pneumonitis   Shortness of breath   Palliative care by specialist   Goals of care, counseling/discussion  Acute on chronic hypoxic and hypercapnic respiratory failure. Differential continue to remain broad as there is no definitive diagnosis at this time. ARDS/pneumonitis/aspiration/pulmonary edema/PE. COVID-19 infection unlikely as negative PCR and no fever. PE unlikely as patient is on DOAC. Aspiration/bilateral pneumonia can be a possibility although patient is afebrile-we will continue broad-spectrum antibiotics with cefepime, vancomycin and azithromycin. BNP was not very impressive for heart failure,not much response to diuresis.ESR is not very impressive either but patient was on steroid.  She had one episode of small amount of hemoptysis yesterday. -Patient was on high-dose steroids which was decreased to Solu-Medrol 80 mg twice daily by PCCM yesterday. -Calcitonin check twice was less than 0.10 which shows discontinuation of antibiotics.  Completed a course of azithromycin. -Repeat chest x-ray with worsening of bilateral infiltrate.  ARDS/pulmonary edema/atypical infection which is unlikely.  Most likely worsening of her parenchymal lung disease.  Discussed with Dr. Karl Bales from PCCM, according to him it looks like [place] versus post viral pneumonitis, not much to offer except continuation of steroid. He also advised that we will not diurese today as patient appears dry and no antibiotics at this time. -Palliative consult.-Will appreciate their help. -Very difficult situation, apparently poor prognosis.  Discussed in detail with both daughters in person, they wants to take some time to make further decision regarding her goals of care.  Hypothermia.  Improving.  Blood cultures results pending. -Continue bear hugger as needed. -Keep monitoring.  Hypertension.   Blood pressure elevated today but better than yesterday.  Her amlodipine and as needed  hydralazine was increased yesterday. -Continue amlodipine to 10 mg daily. -Continue hydralazine to 10 mg as needed  per systolic blood pressure above 180.  AKI with CKD.  Slight improvement in renal function. -Continue gentle hydration. -Continue monitoring renal function. -Avoid nephrotoxic.  Hypernatremia.  sodium, 149 today. -Continue gentle hydration with half saline.  Cannot give D5 because of hyperglycemia. -Monitor electrolytes  Diastolic heart failure.  Repeat BNP slightly more than admission but remains unimpressive.  Clinically appears dry with weight stable today. -Continue with strict ins and outs and daily weights. -Lasix as needed.  Chronic ITP.Patient recently received 2 doses of Nplate and Aranesp during previous hospitalization. Under care of Dr. Marin Olp. Oncology is following-we appreciate their recommendations. Platelets stable. -Solu-Medrol dose was decreased to 80 mg twice daily today.  Leukocytosis.  Most likely secondary to steroid.    Type 2 diabetes. A1c 7.3 on 07/19/2019. Remains hyperglycemic due to high-dose steroids. -Convert SSI to resistant scale. -Keep monitoring.  Atypical atrial flutter.Currently in sinus rhythm with history of recent cardioversion on 06/26/2019. -Eliquis was discontinued 11/13 due to hemoptysis.  Hypothyroidism. TSH 0.838 on 07/04/2019. -Continue home dose of Synthroid.  OSA.On nocturnal CPAP at home, currently on BiPAP.  Objective: Vitals:   07/25/19 0600 07/25/19 0700 07/25/19 0800 07/25/19 0830  BP: (!) 162/51 (!) 162/44 (!) 162/44   Pulse: 66 (!) 57 (!) 58 60  Resp: (!) 21 20 (!) 24 18  Temp:   (!) 96.8 F (36 C)   TempSrc:   Axillary   SpO2: 100% 95% 96% 96%  Weight:      Height:        Intake/Output Summary (Last 24 hours) at 07/25/2019 0946 Last data filed at 07/25/2019 8786 Gross per 24 hour  Intake 1326.13 ml  Output 1250 ml  Net 76.13 ml   Filed Weights   07/23/19 0337 07/24/19 0500  07/25/19 0441  Weight: 109 kg 106.9 kg 106.3 kg    Examination:  General exam: Patient appears comfortable and resting.  Easily arousable and answers appropriately. Respiratory system: Few bilateral scattered crackles, decreased breath sound at bases, respiratory effort normal. Cardiovascular system: S1 & S2 heard, RRR. No JVD, murmurs, rubs, gallops or clicks.  No pedal edema. Gastrointestinal system: Abdomen is nondistended, soft and nontender. No organomegaly or masses felt. Normal bowel sounds heard. Central nervous system: Alert and oriented. No focal neurological deficits. Extremities: Symmetric 5 x 5 power. Skin: Multiple upper extremity ecchymosis. Psychiatry: Judgement and insight appear normal. Mood & affect appropriate.   DVT prophylaxis: SCDs Code Status: DNR Family Communication: Both daughters were updated in person  Disposition Plan: Pending improvement/goals of care discussion. Prognosis seems poor.  Consultants:   PCCM  Oncology  Palliative  Procedures:   Antimicrobials:  No current antibiotics.  Data Reviewed: I have personally reviewed following labs and imaging studies  CBC: Recent Labs  Lab 07/19/19 1615  07/21/19 0243 07/22/19 0204 07/23/19 0242 07/24/19 0237 07/25/19 0214  WBC 15.4*   < > 11.1* 14.5* 15.2* 10.0 10.3  NEUTROABS 14.0*  --   --   --   --   --   --   HGB 10.3*   < > 8.4* 8.8* 8.8* 8.2* 8.1*  HCT 34.3*   < > 28.0* 30.0* 30.9* 28.6* 28.2*  MCV 95.8   < > 97.6 99.3 102.3* 102.9* 101.8*  PLT 224   < > 187 214 203 146* 152   < > = values in this interval not displayed.   Basic Metabolic Panel: Recent Labs  Lab 07/20/19 0145 07/21/19 0243 07/22/19 0204 07/23/19 0242 07/23/19 1421  07/24/19 0237 07/25/19 0214  NA 137 147* 150* 152* 147* 149* 149*  K 4.8 4.9 4.5 4.4 4.7 4.5 5.0  CL 90* 97* 101 105 102 103 107  CO2 36* 37* 37* 38* 35* 38* 36*  GLUCOSE 268* 280* 290* 203* 321* 196* 142*  BUN 102* 105* 93* 96* 97* 98* 91*   CREATININE 1.94* 2.16* 2.29* 1.96*  1.81* 2.02* 2.03* 1.94*  CALCIUM 9.1 9.7 9.8 9.8 9.6 9.1 9.4  MG 2.3  --   --   --   --   --   --   PHOS 5.3* 5.0*  --  4.6  --  4.8* 4.2   GFR: Estimated Creatinine Clearance: 25.6 mL/min (A) (by C-G formula based on SCr of 1.94 mg/dL (H)). Liver Function Tests: Recent Labs  Lab 07/20/19 0145 07/21/19 0243 07/23/19 0242 07/24/19 0237 07/25/19 0214  AST 19  --   --   --   --   ALT 18  --   --   --   --   ALKPHOS 38  --   --   --   --   BILITOT 1.0  --   --   --   --   PROT 6.0*  --   --   --   --   ALBUMIN 3.2* 3.2* 3.5 3.2* 3.1*   No results for input(s): LIPASE, AMYLASE in the last 168 hours. No results for input(s): AMMONIA in the last 168 hours. Coagulation Profile: No results for input(s): INR, PROTIME in the last 168 hours. Cardiac Enzymes: No results for input(s): CKTOTAL, CKMB, CKMBINDEX, TROPONINI in the last 168 hours. BNP (last 3 results) No results for input(s): PROBNP in the last 8760 hours. HbA1C: No results for input(s): HGBA1C in the last 72 hours. CBG: Recent Labs  Lab 07/24/19 1712 07/24/19 1948 07/24/19 2331 07/25/19 0416 07/25/19 0807  GLUCAP 262* 191* 126* 112* 92   Lipid Profile: No results for input(s): CHOL, HDL, LDLCALC, TRIG, CHOLHDL, LDLDIRECT in the last 72 hours. Thyroid Function Tests: No results for input(s): TSH, T4TOTAL, FREET4, T3FREE, THYROIDAB in the last 72 hours. Anemia Panel: No results for input(s): VITAMINB12, FOLATE, FERRITIN, TIBC, IRON, RETICCTPCT in the last 72 hours. Sepsis Labs: Recent Labs  Lab 07/19/19 2214 07/22/19 1446 07/23/19 0242 07/24/19 0237  PROCALCITON <0.10 <0.10 <0.10 0.16    Recent Results (from the past 240 hour(s))  SARS CORONAVIRUS 2 (TAT 6-24 HRS) Nasopharyngeal Nasopharyngeal Swab     Status: None   Collection Time: 07/18/19 10:11 AM   Specimen: Nasopharyngeal Swab  Result Value Ref Range Status   SARS Coronavirus 2 NEGATIVE NEGATIVE Final    Comment:  (NOTE) SARS-CoV-2 target nucleic acids are NOT DETECTED. The SARS-CoV-2 RNA is generally detectable in upper and lower respiratory specimens during the acute phase of infection. Negative results do not preclude SARS-CoV-2 infection, do not rule out co-infections with other pathogens, and should not be used as the sole basis for treatment or other patient management decisions. Negative results must be combined with clinical observations, patient history, and epidemiological information. The expected result is Negative. Fact Sheet for Patients: SugarRoll.be Fact Sheet for Healthcare Providers: https://www.woods-mathews.com/ This test is not yet approved or cleared by the [place] FDA and  has been authorized for detection and/or diagnosis of SARS-CoV-2 by FDA under an Emergency Use Authorization (EUA). This EUA will remain  in effect (meaning this test can be used) for the duration of the COVID-19 declaration under Section 56  4(b)(1) of the Act, 21 U.S.C. section 360bbb-3(b)(1), unless the authorization is terminated or revoked sooner. Performed at [facility], [place] [address] [place], [place] [address]   MRSA PCR Screening     Status: Abnormal   Collection Time: 07/19/19  9:31 PM   Specimen: Nasal Mucosa; Nasopharyngeal  Result Value Ref Range Status   MRSA by PCR POSITIVE (A) NEGATIVE Final    Comment:        The GeneXpert MRSA Assay (FDA approved for NASAL specimens only), is one component of a comprehensive MRSA colonization surveillance program. It is not intended to diagnose MRSA infection nor to guide or monitor treatment for MRSA infections. RESULT CALLED TO, READ BACK BY AND VERIFIED WITH: ROTHERMEL,A @ 3662 HU [id number] BY POTEAT,S Performed at [facility], [place] [address], [place], [place] [address]      Radiology Studies: Dg Chest Port 1 View  Result Date: 07/24/2019 CLINICAL DATA:   Shortness of breath EXAM: PORTABLE CHEST 1 VIEW July 22, 2019 FINDINGS: There is cardiomegaly with pulmonary venous hypertension. There are pleural effusions bilaterally. There is airspace consolidation in each lung base region, more on the left than on the right. There is also interstitial edema which is similar to recent prior study. There is slightly less airspace opacity in the right upper lobe compared to most recent study. No new opacity is evident. There is aortic atherosclerosis.  No adenopathy.  No bone lesions. IMPRESSION: Cardiomegaly with pulmonary vascular congestion. Persistent pleural effusions. Interstitial edema is present with airspace consolidation in the lung bases. Question alveolar edema versus pneumonia in the bases. Both entities may exist concurrently. There is less airspace opacity in the right upper lobe compared to most recent study. No new opacity evident on either side. Overall appearance is felt to be most indicative of congestive heart failure. A degree of superimposed ARDS is possible. Aortic Atherosclerosis (ICD10-I70.0). Electronically Signed   By: Lowella Grip III M.D.   On: 07/24/2019 07:57    Scheduled Meds: . amLODipine  10 mg Oral Daily  . atorvastatin  40 mg Oral QHS  . chlorhexidine  15 mL Mouth Rinse BID  . Chlorhexidine Gluconate Cloth  6 each Topical Daily  . feeding supplement (GLUCERNA SHAKE)  237 mL Oral TID BM  . feeding supplement (PRO-STAT SUGAR FREE 64)  30 mL Oral BID  . fenofibrate  54 mg Oral Daily  . insulin aspart  0-20 Units Subcutaneous Q4H  . insulin glargine  40 Units Subcutaneous QHS  . levothyroxine  112 mcg Oral QAC breakfast  . mouth rinse  15 mL Mouth Rinse q12n4p  . methylPREDNISolone (SOLU-MEDROL) injection  80 mg Intravenous Q12H  . multivitamin with minerals  1 tablet Oral Daily  . pantoprazole  40 mg Oral BID AC  . sertraline  100 mg Oral QPM  . sodium chloride flush  3 mL Intravenous Q12H   Continuous Infusions: .  sodium chloride 50 mL/hr at 07/25/19 0638  . sodium chloride       LOS: 6 days   Time spent: 40 minutes  Lorella Nimrod, MD [facility] Pager [phone]  If 7PM-7AM, please contact night-coverage [email or web address] Password [email or web address] 07/25/2019, 9:46 AM   This record has been created using Systems analyst. Errors have been sought and corrected,but may not always be located. Such creation errors do not reflect on the standard of care.

## 2019-07-26 ENCOUNTER — Inpatient Hospital Stay (HOSPITAL_COMMUNITY): Payer: Medicare Other

## 2019-07-26 LAB — GLUCOSE, CAPILLARY
Glucose-Capillary: 128 mg/dL — ABNORMAL HIGH (ref 70–99)
Glucose-Capillary: 216 mg/dL — ABNORMAL HIGH (ref 70–99)
Glucose-Capillary: 56 mg/dL — ABNORMAL LOW (ref 70–99)
Glucose-Capillary: 59 mg/dL — ABNORMAL LOW (ref 70–99)
Glucose-Capillary: 80 mg/dL (ref 70–99)
Glucose-Capillary: 241 mg/dL — ABNORMAL HIGH (ref 70–99)
Glucose-Capillary: 280 mg/dL — ABNORMAL HIGH (ref 70–99)
Glucose-Capillary: 290 mg/dL — ABNORMAL HIGH (ref 70–99)
Glucose-Capillary: 225 mg/dL — ABNORMAL HIGH (ref 70–99)

## 2019-07-26 LAB — RENAL FUNCTION PANEL
Albumin: 3.1 g/dL — ABNORMAL LOW (ref 3.5–5.0)
Anion gap: 7 (ref 5–15)
BUN: 92 mg/dL — ABNORMAL HIGH (ref 8–23)
CO2: 35 mmol/L — ABNORMAL HIGH (ref 22–32)
Calcium: 9.3 mg/dL (ref 8.9–10.3)
Chloride: 107 mmol/L (ref 98–111)
Creatinine, Ser: 2.02 mg/dL — ABNORMAL HIGH (ref 0.44–1.00)
GFR calc Af Amer: 26 mL/min — ABNORMAL LOW (ref >60)
GFR calc non Af Amer: 23 mL/min — ABNORMAL LOW (ref >60)
Glucose, Bld: 193 mg/dL — ABNORMAL HIGH (ref 70–99)
Phosphorus: 4.6 mg/dL (ref 2.5–4.6)
Potassium: 4.8 mmol/L (ref 3.5–5.1)
Sodium: 149 mmol/L — ABNORMAL HIGH (ref 135–145)

## 2019-07-26 LAB — CBC
HCT: 28.3 % — ABNORMAL LOW (ref 36.0–46.0)
Hemoglobin: 8 g/dL — ABNORMAL LOW (ref 12.0–15.0)
MCH: 29.2 pg (ref 26.0–34.0)
MCHC: 28.3 g/dL — ABNORMAL LOW (ref 30.0–36.0)
MCV: 103.3 fL — ABNORMAL HIGH (ref 80.0–100.0)
Platelets: 150 10*3/uL (ref 150–400)
RBC: 2.74 MIL/uL — ABNORMAL LOW (ref 3.87–5.11)
RDW: 22 % — ABNORMAL HIGH (ref 11.5–15.5)
WBC: 8.7 10*3/uL (ref 4.0–10.5)
nRBC: 0 % (ref 0.0–0.2)

## 2019-07-26 MED ORDER — ENSURE ENLIVE PO LIQD
237.0000 mL | Freq: Three times a day (TID) | ORAL | Status: DC
  Administered 2019-07-27 – 2019-08-07 (×23): 237 mL via ORAL

## 2019-07-26 MED ORDER — INSULIN GLARGINE 100 UNIT/ML ~~LOC~~ SOLN
25.0000 [IU] | Freq: Every day | SUBCUTANEOUS | Status: DC
  Administered 2019-07-26: 25 [IU] via SUBCUTANEOUS
  Filled 2019-07-26 (×2): qty 0.25

## 2019-07-26 MED ORDER — FUROSEMIDE 10 MG/ML IJ SOLN
40.0000 mg | Freq: Once | INTRAMUSCULAR | Status: AC
  Administered 2019-07-26: 40 mg via INTRAVENOUS
  Filled 2019-07-26: qty 4

## 2019-07-26 NOTE — Progress Notes (Signed)
Patient CBG 59 at 8:00AM. Cup of orange sherbert given per patient request. Also given 4 oz of juice. MD paged to make adjustments to Lantus given at night

## 2019-07-26 NOTE — Progress Notes (Signed)
NAME:  [name], MRN:  [id number], DOB:  1934/09/13, LOS: 7 ADMISSION DATE:  07/19/2019, CONSULTATION DATE:  11/12 REFERRING MD:  Roel Cluck, CHIEF COMPLAINT:  Recurrent respiratory failure   Brief History    84 y/o female was admitted on 11/11 with recurrent hypoxemic/hypercarbic respiratory failure requiring Bipap support in setting of diffuse pulmonary infiltrates of unknown origin  She underwent elective cardioversion for chronic AF on 10/19 and was discharged on 2 L O2.  She was admitted 10/26-11/10 for acute hypoxic respiratory failure with bilateral infiltrates, felt to be pneumonitis of unclear etiology.  Serology negative.  Discharged on 6 L oxygen and prednisone taper  Past Medical History  CKD Stage 3, Anemia of chronic disease,Atrial fibrillation on Elaquis, s/p cardioversion,OSA on CPAP,HFpEF ,HLD,Type II DM HTN,Hypothyroidism,Chronic ITP,PE 2010  Significant Hospital Events   Stopped eliquis 11/13 due to concern with hemoptysis report and ? alv hem on cxr/ ct  Consults:  11/12 >> PCCM  Procedures:   Echo 07/05/19 (p onset of sob/ infiltrates: Grade I diastolic dysfunction with Mild LAE, nl RA  Significant Diagnostic Tests:   10/27 CT Chest >> Widespread ground-glass opacities, small bilat pleural effusions   Micro Data:  11/10 >> Sars-CoV2 neg 11/10 >> MRSA PCR pos 11/11  Urine strep neg 11/11  Urine legionella neg   Note PCT  11/15  Neg    Antimicrobials:  11/11 Vanco  >>11/15 11/11 Cefepime  >>11/15 11/11 Azithromycin >> 11/15  Interim history/subjective:   Required 65% FiO2 with 25 L on high flow, BiPAP 50% overnight Afebrile, body temperature appears warmer   Objective   Blood pressure (!) 166/42, pulse 60, temperature (!) 97.4 F (36.3 C), temperature source Oral, resp. rate (!) 22, height 5' 1" (1.549 m), weight 108.9 kg, SpO2 90 %.    FiO2 (%):  [50 %-65 %] 50 %   Intake/Output Summary (Last 24 hours) at 07/26/2019 1003 Last data filed  at 07/26/2019 0900 Gross per 24 hour  Intake 294.6 ml  Output 850 ml  Net -555.4 ml  sats 94% on 50% Filed Weights   07/24/19 0500 07/25/19 0441 07/26/19 0500  Weight: 106.9 kg 106.3 kg 108.9 kg    Examination: Obese elderly, chronically ill-appearing wf nad on high flow cannula, no distress No jvd Oropharynx -dry Neck supple Lungs with distant bs, a few crackles  bilaterally RRR no s3 or or sign murmur Abd obese with limited excursion  Extr warm with no edema or clubbing noted Neuro alert, interactive, recognize me  Chest x-ray 11/16 personally reviewed which shows mild improvement in right upper lobe aeration, cardiomegaly and vascular congestion with bilateral effusions Assessment & Plan:   Recurrent acute on chronic hypoxic respiratory failure Diffuse pulmonary infiltrates of unclear etiology PAF (cardioverted and in NSR since Jun 26 2019) CRI (stage III) Mild systolic (EF 63-87%) and grade I diastolic HF Obesity   OSA H/o ITP ( ROMIPLOSTIM & FERAHEME) Chronic anemia  DM  Pulmonary problem list:  Recurrent acute on chronic hypoxemic  respiratory failure in setting of diffuse patchy infiltrates.  DD  No e/o Infection > Note PCT  11/15  Neg  And all cultures neg  Drug  - no record of any of the usual suspects (chemo, amiodarone/nitrofurantoin)  Serology negative for Connective tissue dz/ BOOP > esr 120 prior to steroids  Hemorrhage on eliquis > d/c'd 11/13  Moderate pulmonary hypertension on echo    PLAN: Continue Solu-Medrol 80 every 12 Stay  Off abx  attempt  to lower FiO2 on high flow maintaining saturation 92% and above Unfortunately not a candidate for bronchoscopy or surgical biopsy Obtain chest x-ray today  OSA/OHS-maintained on CPAP at home prior to acute illness, continue BiPAP during sleep  Steroid-induced hyperglycemia-Per primary  Palliative conversation noted, I remain 'cautiously optimistic' that her hypoxia will gradually improve Updated  daughter  in detail daily  Rakesh Alva MD. FCCP. [facility]  If no response to pager , please call [phone]   07/26/2019           

## 2019-07-26 NOTE — Progress Notes (Addendum)
PROGRESS NOTE    [name]  NGE:[id number] DOB: 04/02/1935 DOA: 07/19/2019 PCP: Javier Glazier, MD   Brief Narrative:84 y.o.femalewith medical history significant of chronic ITP, CKD stage3, anemia of chronic disease, A.fib on Eliquis,sleep apnea on CPAP history of CHF HLD history of pulmonary embolism in 2010 diabetes type 2, HTN,hypothyroidism, pressure ulcer Presented withworsening shortness of breathfromSNF. Multiple recent admissions in October, most recentlyshe was discharged to SNF 1 day ago from [facility] where she was treated for acute respiratory failure and thought to be due to pneumonitis and was sent home with tapering dose of steroid. She was having increased oxygen requirement, initially managed with nonrebreather. BiPAP was startedlaterdue to worsening respiratory status. Chest x-ray with worsening of bilateral infiltrate, broader differential which includes pneumonitis, bilateral pneumonia, ARDS, pulmonary vascular congestion. She was started on broad-spectrum antibiotics,Lasix and steroids. Antibiotics were discontinued after having normal procalcitonin multiple times. Continue steroid and give Lasix as needed.  Currently appears dry. PCCM is on board and does not think that there is much to offer for her.  Assessment & Plan:   Principal Problem:   Acute respiratory failure with hypoxia (HCC) Active Problems:   Hypothyroidism   HYPERCHOLESTEROLEMIA   OBESITY   Essential hypertension   Chronic kidney disease (CKD) stage G4/A1, severely decreased glomerular filtration rate (GFR) between 15-29 mL/min/1.73 square meter and albuminuria creatinine ratio less than 30 mg/g (HCC)   Chronic kidney disease   Chronic ITP (idiopathic thrombocytopenia) (HCC)   Normocytic anemia   Atypical atrial flutter (HCC)   HCAP (healthcare-associated pneumonia)   Pneumonitis   Shortness of breath   Palliative care by specialist   Goals of care,  counseling/discussion   #1 acute on chronic hypoxic and hypercapnic respiratory failure of unclear etiology-prior to admission patient was not on oxygen now she is requiring 25 to 30 L of oxygen to keep her sats above 92%. Chest x-ray done today shows worsening of the interstitial and airspace disease compared to 2 days ago. On IV steroids Lasix 40 mg x 1 Palliative care and PCCM following appreciate their input  #2 hypothermia resolved  #3 hypertension continue amlodipine and hydralazine as needed  #4 AKI on CKD stage III creatinine stable follow-up tomorrow  #5 diastolic heart failure with mild systolic heart failure Lasix 1 dose today  #6 chronic ITP oncology following stable  #7 type 2 diabetes patient with very poor oral intake.  Hypoglycemic today Lantus dose decreased for tonight.  #8 atrial fibrillation chronic status post cardioversion 06/26/2019 now in sinus rhythm.  Eliquis discontinued due to recent hemoptysis 07/21/2019.  #9 hypothyroidism continue Synthroid.  #10 obstructive sleep apnea on BiPAP  #11 hypernatremia monitor closely  #12 depression continue Zoloft    Pressure Injury 07/08/19 Buttocks Right;Left;Medial;Lower Stage II -  Partial thickness loss of dermis presenting as a shallow open ulcer with a red, pink wound bed without slough. Red, nonblanchable, (Active)  07/08/19 1000  Location: Buttocks  Location Orientation: Right;Left;Medial;Lower  Staging: Stage II -  Partial thickness loss of dermis presenting as a shallow open ulcer with a red, pink wound bed without slough.  Wound Description (Comments): Red, nonblanchable,  Present on Admission: Yes      Nutrition Problem: Increased nutrient needs Etiology: chronic illness(CHF)     Signs/Symptoms: estimated needs    Interventions: Glucerna shake, Prostat, MVI  Estimated body mass index is 45.36 kg/m as calculated from the following:   Height as of this encounter: 5\' 1"  (1.549 m).  Weight as of this encounter: 108.9 kg.  DVT prophylaxis: SCDs due to thrombocytopenia  code Status: DO NOT RESUSCITATE  family Communication: Discussed with both daughters face-to-face in the patient's room  disposition Plan: Pending clinical improvement  Consultants:   PCCM oncology and palliative care  Procedures: None Antimicrobials: None  Subjective: Resting in bed awake alert daughters by the bedside She has a good sense of humor   Objective: Vitals:   07/26/19 1200 07/26/19 1300 07/26/19 1400 07/26/19 1435  BP: (!) 179/44 (!) 167/49 (!) 158/48   Pulse: 62 64 64   Resp: (!) 24 20 (!) 23   Temp: (!) 97.4 F (36.3 C)     TempSrc: Axillary     SpO2: 92% 98% 97% 97%  Weight:      Height:        Intake/Output Summary (Last 24 hours) at 07/26/2019 1501 Last data filed at 07/26/2019 0900 Gross per 24 hour  Intake 123 ml  Output 850 ml  Net -727 ml   Filed Weights   07/24/19 0500 07/25/19 0441 07/26/19 0500  Weight: 106.9 kg 106.3 kg 108.9 kg    Examination:  General exam: Appears calm and comfortable  Respiratory system: Diminished breath sounds at the bases with crackles to auscultation. Respiratory effort normal. Cardiovascular system: S1 & S2 heard, RRR. No JVD, murmurs, rubs, gallops or clicks. No pedal edema. Gastrointestinal system: Abdomen is nondistended, soft and nontender. No organomegaly or masses felt. Normal bowel sounds heard. Central nervous system: Alert and oriented. No focal neurological deficits. Extremities: 1+ pitting edema bilaterally. Skin: No rashes, lesions or ulcers Psychiatry: Judgement and insight appear normal. Mood & affect appropriate.     Data Reviewed: I have personally reviewed following labs and imaging studies  CBC: Recent Labs  Lab 07/19/19 1615  07/22/19 0204 07/23/19 0242 07/24/19 0237 07/25/19 0214 07/26/19 0151  WBC 15.4*   < > 14.5* 15.2* 10.0 10.3 8.7  NEUTROABS 14.0*  --   --   --   --   --   --   HGB  10.3*   < > 8.8* 8.8* 8.2* 8.1* 8.0*  HCT 34.3*   < > 30.0* 30.9* 28.6* 28.2* 28.3*  MCV 95.8   < > 99.3 102.3* 102.9* 101.8* 103.3*  PLT 224   < > 214 203 146* 152 150   < > = values in this interval not displayed.   Basic Metabolic Panel: Recent Labs  Lab 07/20/19 0145 07/21/19 0243  07/23/19 0242 07/23/19 1421 07/24/19 0237 07/25/19 0214 07/26/19 0151  NA 137 147*   < > 152* 147* 149* 149* 149*  K 4.8 4.9   < > 4.4 4.7 4.5 5.0 4.8  CL 90* 97*   < > 105 102 103 107 107  CO2 36* 37*   < > 38* 35* 38* 36* 35*  GLUCOSE 268* 280*   < > 203* 321* 196* 142* 193*  BUN 102* 105*   < > 96* 97* 98* 91* 92*  CREATININE 1.94* 2.16*   < > 1.96*   1.81* 2.02* 2.03* 1.94* 2.02*  CALCIUM 9.1 9.7   < > 9.8 9.6 9.1 9.4 9.3  MG 2.3  --   --   --   --   --   --   --   PHOS 5.3* 5.0*  --  4.6  --  4.8* 4.2 4.6   < > = values in this interval not displayed.   GFR: Estimated Creatinine Clearance: 24.9  mL/min (A) (by C-G formula based on SCr of 2.02 mg/dL (H)). Liver Function Tests: Recent Labs  Lab 07/20/19 0145 07/21/19 0243 07/23/19 0242 07/24/19 0237 07/25/19 0214 07/26/19 0151  AST 19  --   --   --   --   --   ALT 18  --   --   --   --   --   ALKPHOS 38  --   --   --   --   --   BILITOT 1.0  --   --   --   --   --   PROT 6.0*  --   --   --   --   --   ALBUMIN 3.2* 3.2* 3.5 3.2* 3.1* 3.1*   No results for input(s): LIPASE, AMYLASE in the last 168 hours. No results for input(s): AMMONIA in the last 168 hours. Coagulation Profile: No results for input(s): INR, PROTIME in the last 168 hours. Cardiac Enzymes: No results for input(s): CKTOTAL, CKMB, CKMBINDEX, TROPONINI in the last 168 hours. BNP (last 3 results) No results for input(s): PROBNP in the last 8760 hours. HbA1C: No results for input(s): HGBA1C in the last 72 hours. CBG: Recent Labs  Lab 07/26/19 0355 07/26/19 0814 07/26/19 0816 07/26/19 0851 07/26/19 1239  GLUCAP 128* 56* 59* 80 241*   Lipid Profile: No  results for input(s): CHOL, HDL, LDLCALC, TRIG, CHOLHDL, LDLDIRECT in the last 72 hours. Thyroid Function Tests: No results for input(s): TSH, T4TOTAL, FREET4, T3FREE, THYROIDAB in the last 72 hours. Anemia Panel: No results for input(s): VITAMINB12, FOLATE, FERRITIN, TIBC, IRON, RETICCTPCT in the last 72 hours. Sepsis Labs: Recent Labs  Lab 07/19/19 2214 07/22/19 1446 07/23/19 0242 07/24/19 0237  PROCALCITON <0.10 <0.10 <0.10 0.16    Recent Results (from the past 240 hour(s))  SARS CORONAVIRUS 2 (TAT 6-24 HRS) Nasopharyngeal Nasopharyngeal Swab     Status: None   Collection Time: 07/18/19 10:11 AM   Specimen: Nasopharyngeal Swab  Result Value Ref Range Status   SARS Coronavirus 2 NEGATIVE NEGATIVE Final    Comment: (NOTE) SARS-CoV-2 target nucleic acids are NOT DETECTED. The SARS-CoV-2 RNA is generally detectable in upper and lower respiratory specimens during the acute phase of infection. Negative results do not preclude SARS-CoV-2 infection, do not rule out co-infections with other pathogens, and should not be used as the sole basis for treatment or other patient management decisions. Negative results must be combined with clinical observations, patient history, and epidemiological information. The expected result is Negative. Fact Sheet for Patients: SugarRoll.be Fact Sheet for Healthcare Providers: https://www.[employer].com/ This test is not yet approved or cleared by the [place] FDA and  has been authorized for detection and/or diagnosis of SARS-CoV-2 by FDA under an Emergency Use Authorization (EUA). This EUA will remain  in effect (meaning this test can be used) for the duration of the COVID-19 declaration under Section 56 4(b)(1) of the Act, 21 U.S.C. section 360bbb-3(b)(1), unless the authorization is terminated or revoked sooner. Performed at [facility], [place] [address] [place], [place] [address]   MRSA  PCR Screening     Status: Abnormal   Collection Time: 07/19/19  9:31 PM   Specimen: Nasal Mucosa; Nasopharyngeal  Result Value Ref Range Status   MRSA by PCR POSITIVE (A) NEGATIVE Final    Comment:        The GeneXpert MRSA Assay (FDA approved for NASAL specimens only), is one component of a comprehensive MRSA colonization surveillance program. It  is not intended to diagnose MRSA infection nor to guide or monitor treatment for MRSA infections. RESULT CALLED TO, READ BACK BY AND VERIFIED WITH: ROTHERMEL,A @ 2951 OA [id number] BY POTEAT,S Performed at [facility], [place] [address] [place], [place] [address]   Culture, blood (routine x 2)     Status: None (Preliminary result)   Collection Time: 07/24/19  1:07 PM   Specimen: BLOOD  Result Value Ref Range Status   Specimen Description   Final    BLOOD LEFT ARM Performed at [facility] [address] [place], [place] [address]    Special Requests   Final    BOTTLES DRAWN AEROBIC AND ANAEROBIC Blood Culture results may not be optimal due to an inadequate volume of blood received in culture bottles Performed at [facility] [address], [place], [place] [address]    Culture   Final    NO GROWTH 2 DAYS Performed at [facility] [address] [place], [place] [address]    Report Status PENDING  Incomplete  Culture, blood (routine x 2)     Status: None (Preliminary result)   Collection Time: 07/24/19  1:14 PM   Specimen: BLOOD  Result Value Ref Range Status   Specimen Description   Final    BLOOD LEFT ARM Performed at [facility] [address], [place], [place] [address]    Special Requests   Final    BOTTLES DRAWN AEROBIC AND ANAEROBIC Blood Culture adequate volume Performed at [facility] [address] [place], [place] [address]    Culture   Final    NO GROWTH 2 DAYS Performed at [facility]  [address], [place],  [address]    Report Status PENDING  Incomplete         Radiology Studies: Dg Chest Port 1 View  Result Date: 07/26/2019 CLINICAL DATA:  84 year old female with acute respiratory failure. EXAM: PORTABLE CHEST 1 VIEW COMPARISON:  Chest radiograph dated 07/24/2019. FINDINGS: Interval worsening of the interstitial and airspace disease since the prior radiograph likely represent worsening CHF, pneumonia, or ARDS. Clinical correlation is recommended. Small bilateral pleural effusions as seen previously. No pneumothorax. Stable cardiomegaly. Atherosclerotic calcification of the aorta. No acute osseous pathology. IMPRESSION: 1. Interval worsening of the interstitial and airspace disease compared to the prior radiograph. 2. Stable cardiomegaly. 3. Small bilateral pleural effusions. Electronically Signed   By: Anner Crete M.D.   On: 07/26/2019 11:10        Scheduled Meds:  amLODipine  10 mg Oral Daily   atorvastatin  40 mg Oral QHS   chlorhexidine  15 mL Mouth Rinse BID   Chlorhexidine Gluconate Cloth  6 each Topical Daily   feeding supplement (ENSURE ENLIVE)  237 mL Oral TID BM   feeding supplement (PRO-STAT SUGAR FREE 64)  30 mL Oral BID   fenofibrate  54 mg Oral Daily   insulin aspart  0-20 Units Subcutaneous Q4H   insulin glargine  40 Units Subcutaneous QHS   levothyroxine  112 mcg Oral QAC breakfast   mouth rinse  15 mL Mouth Rinse q12n4p   methylPREDNISolone (SOLU-MEDROL) injection  80 mg Intravenous Q12H   multivitamin with minerals  1 tablet Oral Daily   pantoprazole  40 mg Oral BID AC   sertraline  100 mg Oral QPM   sodium chloride flush  3 mL Intravenous Q12H   Continuous Infusions:  sodium chloride  LOS: 7 days     Georgette Shell, MD [facility]  If 7PM-7AM, please contact night-coverage [email or web address] Password [email or web address] 07/26/2019, 3:01 PM

## 2019-07-26 NOTE — Progress Notes (Signed)
Daily Progress Note   Patient Name: [name]       Date: 07/26/2019 DOB: 1935-05-12  Age: 84 y.o. MRN#: [id number] Attending Physician: Georgette Shell, MD Primary Care Physician: Javier Glazier, MD Admit Date: 07/19/2019  Reason for Consultation/Follow-up: Establishing goals of care  Subjective:  Daughters at bedside today.  Patient much more awake.  When asked about needs, she states, "orange sherbet."  She then says that this taste very good to her and is what she wants to continue to feel well.  Discussed clinical course overnight.  Her daughter report remaining concerned as she is still requiring high volume O2 and had a chest x-ray that was read by radiology as worsening this morning.   They continue to have questions regarding chest x-ray done this morning.  Dr. Rodena Piety had was reviewed this with them and plan is for a dose of Lasix.  I also reviewed my impression of CXR.  They asked me to reach out to Dr. Elsworth Soho to see if he has been able to review x-ray as well.  Length of Stay: 7  Current Medications: Scheduled Meds:  . amLODipine  10 mg Oral Daily  . atorvastatin  40 mg Oral QHS  . chlorhexidine  15 mL Mouth Rinse BID  . Chlorhexidine Gluconate Cloth  6 each Topical Daily  . feeding supplement (ENSURE ENLIVE)  237 mL Oral TID BM  . feeding supplement (PRO-STAT SUGAR FREE 64)  30 mL Oral BID  . fenofibrate  54 mg Oral Daily  . insulin aspart  0-20 Units Subcutaneous Q4H  . insulin glargine  25 Units Subcutaneous QHS  . levothyroxine  112 mcg Oral QAC breakfast  . mouth rinse  15 mL Mouth Rinse q12n4p  . methylPREDNISolone (SOLU-MEDROL) injection  80 mg Intravenous Q12H  . multivitamin with minerals  1 tablet Oral Daily  . pantoprazole  40 mg Oral BID AC  .  sertraline  100 mg Oral QPM  . sodium chloride flush  3 mL Intravenous Q12H    Continuous Infusions: . sodium chloride      PRN Meds: sodium chloride, acetaminophen **OR** acetaminophen, albuterol, bisacodyl, hydrALAZINE, HYDROcodone-acetaminophen, ondansetron **OR** ondansetron (ZOFRAN) IV, sodium chloride, sodium chloride flush  Physical Exam          Much more awake and interactive  She has dependent edema Abdomen is also distended Diminished breath sounds  Vital Signs: BP (!) 139/30   Pulse 69   Temp (!) 97.4 F (36.3 C) (Axillary) Comment: RN Notified  Resp (!) 25   Ht 5\' 1"  (1.549 m)   Wt 108.9 kg   LMP  (LMP Unknown)   SpO2 93%   BMI 45.36 kg/m  SpO2: SpO2: 93 % O2 Device: O2 Device: High Flow Nasal Cannula O2 Flow Rate: O2 Flow Rate (L/min): 25 L/min  Intake/output summary:   Intake/Output Summary (Last 24 hours) at 07/26/2019 1729 Last data filed at 07/26/2019 0900 Gross per 24 hour  Intake 123 ml  Output 350 ml  Net -227 ml   LBM: Last BM Date: 07/18/19 Baseline Weight: Weight: 105.7 kg Most recent weight: Weight: 108.9 kg       Palliative Assessment/Data:      Patient Active Problem List   Diagnosis Date Noted  . Shortness of breath   . Palliative care by specialist   . Goals of care, counseling/discussion   . Pneumonitis 07/14/2019  . Pressure injury of skin 07/10/2019  . HCAP (healthcare-associated pneumonia)   . Acute hypoxemic respiratory failure ([place]) 07/03/2019  . Atypical atrial flutter ([place])   . Normocytic anemia 05/18/2017  . Chronic ITP (idiopathic thrombocytopenia) (HCC) 01/25/2017  . Chronic kidney disease (CKD) stage G4/A1, severely decreased glomerular filtration rate (GFR) between 15-29 mL/min/1.73 square meter and albuminuria creatinine ratio less than 30 mg/g (HCC) 09/04/2015  . Chronic kidney disease 09/04/2015  . Acute respiratory failure with hypoxia ([place]) 08/24/2015  . GOUT 06/23/2010  . DERMATITIS 01/16/2010  . BACK  PAIN 06/13/2009  . MYALGIA 06/13/2009  . Anxiety state 12/13/2008  . LEG EDEMA, CHRONIC 11/08/2008  . Sleep apnea 10/26/2008  . Hypothyroidism 09/27/2008  . HYPERCHOLESTEROLEMIA 09/27/2008  . OBESITY 09/27/2008  . DEPRESSION 09/27/2008  . Essential hypertension 09/27/2008  . ALLERGIC RHINITIS 09/27/2008  . ASTHMA, CHILDHOOD 09/27/2008    Palliative Care Assessment & Plan   Patient Profile:  84 yo lady who lives at [place], has 2 daughters, with the following active hospital issues: Recurrent acute on chronic hypoxic respiratory failure Diffuse pulmonary infiltrates of unclear etiology PAF (cardioverted and in NSR since Jun 26 2019) CRI (stage III) Mild systolic (EF 94-49%) and grade I diastolic HF Obesity   OSA H/o ITP ( ROMIPLOSTIM & FERAHEME) Chronic anemia  DM  Assessment:  generalized weakness Shortness of breath Functional decline recently High O2 requirements  Recommendations/Plan: Goals of care discussions are ongoing with the patient and her 2 daughters. They wish to continue with current mode of care for now.  Patient is much more awake and interactive today.  She denies any complaints and requests only more orange sherbet.  Family requested that I reach out to Dr. Elsworth Soho to see if he has reviewed chest x-ray in case he feels any other changes to manage will be appropriate.   Code Status:    Code Status Orders  (From admission, onward)         Start     Ordered   07/19/19 2131  Do not attempt resuscitation (DNR)  Continuous    Question Answer Comment  In the event of cardiac or respiratory ARREST Do not call a "code blue"   In the event of cardiac or respiratory ARREST Do not perform Intubation, CPR, defibrillation or ACLS   In the event of cardiac or respiratory ARREST Use medication by any route, position, wound  care, and other measures to relive pain and suffering. May use oxygen, suction and manual treatment of airway obstruction as needed  for comfort.      07/19/19 2130        Code Status History    Date Active Date Inactive Code Status Order ID Comments User Context   07/05/2019 1951 07/19/2019 0014 Partial Code [id number]  Kathi Ludwig, MD ED   07/03/2019 2358 07/05/2019 1951 Partial Code [id number]  Welford Roche, MD ED   07/03/2019 2112 07/03/2019 2358 DNR [id number]  Welford Roche, MD ED   08/24/2015 2143 08/29/2015 1750 Full Code [id number]  Toy Baker, MD Inpatient   Advance Care Planning Activity    Advance Directive Documentation     Most Recent Value  Type of Advance Directive  Living will, Healthcare Power of Attorney  Pre-existing out of facility DNR order (yellow form or pink MOST form)  Yellow form placed in chart (order not valid for inpatient use)  "MOST" Form in Place?  -       Prognosis:   guarded   Discharge Planning:  To Be Determined  Care plan was discussed with  Patient, RN, daughters, TRH MD.   Thank you for allowing the Palliative Medicine Team to assist in the care of this patient.   Time In: 1610 Time Out: 1700 Total Time 50 Prolonged Time Billed  no       Greater than 50%  of this time was spent counseling and coordinating care related to the above assessment and plan.  Micheline Rough, MD  Please contact Palliative Medicine Team phone at [phone] for questions and concerns.

## 2019-07-26 NOTE — Progress Notes (Signed)
Nutrition Follow-up  DOCUMENTATION CODES:   Morbid obesity  INTERVENTION:   - Recommend bowel regimen as last recorded BM was on 07/18/19  - Ensure Enlive po TID, each supplement provides 350 kcal and 20 grams of protein  - Continue Pro-stat 30 ml po BID, each supplement provides 100 kcal and 15 grams of protein  - Continue MVI with minerals daily  - Encourage adequate PO intake and provide feeding assistance as needed  If poor PO continues, recommend considering nutrition support if this is within pt's [place].  NUTRITION DIAGNOSIS:   Increased nutrient needs related to chronic illness (CHF) as evidenced by estimated needs.  Ongoing, being addressed via oral nutrition supplements  GOAL:   Patient will meet greater than or equal to 90% of their needs  Progressing  MONITOR:   PO intake, Supplement acceptance, Labs, Weight trends, Skin, I & O's  REASON FOR ASSESSMENT:   Other (Pressure Injury report)    ASSESSMENT:   84 year old female who presented to the ED on 11/11 with SOB. Pt had just been discharged from the hospital after admission for respiratory failure secondary to pneumonitis. PMH of chronic ITP, CKD stage III, anemia, atrial fibrillation, CHF, HLD, T2DM, HTN.  Per CCM notes, pt has been on HFNC during the day at BiPAP at night.  Weight up 7 lbs since admit. Will continue to monitor trends. Per RN edema assessment, pt with mild pitting edema to BUE and non-pitting edema to BLE.  Multiple meal completions missing. Meal completion: 10-15% x 2 recorded meals. Given poor PO at mealtimes, RD will order Ensure Enlive instead of Glucerna to provide more kcal and protein. Elevated CBG's appear to be related to steroids, not PO intake.  If poor PO continues, recommend considering nutrition support if this is within pt's [place].  Unable to reach pt via phone call.  Medications reviewed and include: Glucerna Shake TID, Pro-stat 30 ml BID, fenofibrate, SSI q 4 hours,  Lantus 40 units daily, Solu-medrol, MVI with minerals, Protonix  Labs reviewed: sodium 149, hemoglobin 8.0 CBG's: 56-223 x 24 hours  UOP: 850 ml x 24 hours I/O's: -3.6 L since admit  Diet Order:   Diet Order            Diet Carb Modified Fluid consistency: Thin; Room service appropriate? Yes  Diet effective now              EDUCATION NEEDS:   Not appropriate for education at this time  Skin:  Skin Assessment: Skin Integrity Issues:: Stage II Stage II: right buttocks  Last BM:  07/18/19  Height:   Ht Readings from Last 1 Encounters:  07/20/19 5\' 1"  (1.549 m)    Weight:   Wt Readings from Last 1 Encounters:  07/26/19 108.9 kg    Ideal Body Weight:  47.7 kg  BMI:  Body mass index is 45.36 kg/m.  Estimated Nutritional Needs:   Kcal:  1700-1900  Protein:  90-110 grams  Fluid:  >/= 1.7 L    Gaynell Face, MS, RD, LDN Inpatient Clinical Dietitian Pager: [phone] Weekend/After Hours: [phone]

## 2019-07-26 NOTE — Progress Notes (Signed)
Occupational Therapy Treatment Patient Details Name: [name] MRN: [id number] DOB: 04/02/1935 Today's Date: 07/26/2019    History of present illness Pt is 84 y/o F with PMH: CKD stage 3, Anemia, Afib (on Elaquis), OSA on CPAP. Was d/c'ed to SNF from [facility] for respiratory failure on 07/18/19 (several recent admissions). Pt re-presented to acute setting with increasing oxygen requirement  on 11/11, required BiPaPand found to have worsening bilateral lung infiltrates on CXR, pneumonitis, b/l PNA, ARDS.   OT comments  Pt agreeable to bed level exercises. Educated her and daughter on edema control strategies and positioned at end of session. Pt did use arms for beverage. She is limited with adl abilities due to decreased endurance and cardiopulmonary issues   Follow Up Recommendations  SNF    Equipment Recommendations  (defer to next venue)    Recommendations for Other Services      Precautions / Restrictions Precautions Precautions: Fall       Mobility Bed Mobility                  Transfers                      Balance                                           ADL either performed or assessed with clinical judgement   ADL   Eating/Feeding: Set up;Bed level(beverage only)                                     General ADL Comments: daughter is helping pt with sherbert, which is the only thing she has been eating. Spoons are wide soup spoons, and they hurt the corner of pt's mouth. Daughter has been inserting spoon upside down to eliminate pressure.  Based on clinical judgment, pt needs mod to max A for UB adls and total for LB adls due to decreased cardiopulmonary endurance     Vision       Perception     Praxis      Cognition Arousal/Alertness: Awake/alert Behavior During Therapy: WFL for tasks assessed/performed Overall Cognitive Status: Within Functional Limits for tasks assessed                                           Exercises General Exercises - Upper Extremity Shoulder Flexion: AROM;Both;5 reps;Supine Elbow Flexion: AROM;Both;Supine;10 reps Digit Composite Flexion: AROM;10 reps Other Exercises Other Exercises: pt fatiques easily with UE exercises, sats remained in 90s on HFNC and RR 22-28 with rest breaks   Shoulder Instructions       General Comments educated pt and daughter on edema control. Positioned bil UEs up and issued squeeze ball vs AROM of hands    Pertinent Vitals/ Pain       Pain Assessment: No/denies pain  Home Living                                          Prior Functioning/Environment  Frequency  Min 2X/week        Progress Toward Goals  OT Goals(current goals can now be found in the care plan section)  Progress towards OT goals: Progressing toward goals     Plan      Co-evaluation                 AM-PAC OT "6 Clicks" Daily Activity     Outcome Measure   Help from another person eating meals?: A Lot Help from another person taking care of personal grooming?: A Little Help from another person toileting, which includes using toliet, bedpan, or urinal?: Total Help from another person bathing (including washing, rinsing, drying)?: Total Help from another person to put on and taking off regular upper body clothing?: A Lot Help from another person to put on and taking off regular lower body clothing?: Total 6 Click Score: 10    End of Session    OT Visit Diagnosis: Muscle weakness (generalized) (M62.81)   Activity Tolerance Patient limited by fatigue   Patient Left in bed;with call bell/phone within reach;with bed alarm set;with family/visitor present   Nurse Communication          Time: 3343-5686 OT Time Calculation (min): 24 min  Charges: OT General Charges $OT Visit: 1 Visit OT Treatments $Therapeutic Activity: 8-22 mins $Therapeutic Exercise: 8-22 mins  Lesle Chris, OTR/L Acute Rehabilitation Services [phone] [facility] pager [phone] office 07/26/2019   [place] 07/26/2019, 4:22 PM

## 2019-07-27 ENCOUNTER — Inpatient Hospital Stay: Payer: Medicare Other

## 2019-07-27 ENCOUNTER — Inpatient Hospital Stay: Payer: Medicare Other | Admitting: Family

## 2019-07-27 ENCOUNTER — Inpatient Hospital Stay (HOSPITAL_COMMUNITY): Payer: Medicare Other

## 2019-07-27 LAB — BASIC METABOLIC PANEL
Anion gap: 12 (ref 5–15)
Anion gap: 10 (ref 5–15)
BUN: 95 mg/dL — ABNORMAL HIGH (ref 8–23)
BUN: 99 mg/dL — ABNORMAL HIGH (ref 8–23)
CO2: 33 mmol/L — ABNORMAL HIGH (ref 22–32)
CO2: 38 mmol/L — ABNORMAL HIGH (ref 22–32)
Calcium: 9.8 mg/dL (ref 8.9–10.3)
Calcium: 9.7 mg/dL (ref 8.9–10.3)
Chloride: 107 mmol/L (ref 98–111)
Chloride: 104 mmol/L (ref 98–111)
Creatinine, Ser: 2.02 mg/dL — ABNORMAL HIGH (ref 0.44–1.00)
Creatinine, Ser: 2.1 mg/dL — ABNORMAL HIGH (ref 0.44–1.00)
GFR calc Af Amer: 26 mL/min — ABNORMAL LOW (ref >60)
GFR calc Af Amer: 25 mL/min — ABNORMAL LOW (ref >60)
GFR calc non Af Amer: 23 mL/min — ABNORMAL LOW (ref >60)
GFR calc non Af Amer: 22 mL/min — ABNORMAL LOW (ref >60)
Glucose, Bld: 181 mg/dL — ABNORMAL HIGH (ref 70–99)
Glucose, Bld: 461 mg/dL — ABNORMAL HIGH (ref 70–99)
Potassium: 4.9 mmol/L (ref 3.5–5.1)
Potassium: 4.9 mmol/L (ref 3.5–5.1)
Sodium: 152 mmol/L — ABNORMAL HIGH (ref 135–145)
Sodium: 152 mmol/L — ABNORMAL HIGH (ref 135–145)

## 2019-07-27 LAB — GLUCOSE, CAPILLARY
Glucose-Capillary: 190 mg/dL — ABNORMAL HIGH (ref 70–99)
Glucose-Capillary: 161 mg/dL — ABNORMAL HIGH (ref 70–99)
Glucose-Capillary: 352 mg/dL — ABNORMAL HIGH (ref 70–99)
Glucose-Capillary: 444 mg/dL — ABNORMAL HIGH (ref 70–99)
Glucose-Capillary: 467 mg/dL — ABNORMAL HIGH (ref 70–99)
Glucose-Capillary: 312 mg/dL — ABNORMAL HIGH (ref 70–99)
Glucose-Capillary: 257 mg/dL — ABNORMAL HIGH (ref 70–99)

## 2019-07-27 MED ORDER — FUROSEMIDE 10 MG/ML IJ SOLN
60.0000 mg | Freq: Once | INTRAMUSCULAR | Status: AC
  Administered 2019-07-27: 60 mg via INTRAVENOUS
  Filled 2019-07-27: qty 6

## 2019-07-27 MED ORDER — INSULIN ASPART 100 UNIT/ML ~~LOC~~ SOLN
20.0000 [IU] | Freq: Once | SUBCUTANEOUS | Status: AC
  Administered 2019-07-27: 20 [IU] via SUBCUTANEOUS

## 2019-07-27 MED ORDER — NYSTATIN 100000 UNIT/ML MT SUSP
5.0000 mL | Freq: Four times a day (QID) | OROMUCOSAL | Status: DC
  Administered 2019-07-27 – 2019-08-01 (×17): 500000 [IU] via ORAL
  Filled 2019-07-27 (×13): qty 5

## 2019-07-27 MED ORDER — FUROSEMIDE 10 MG/ML IJ SOLN: 40.0000 mg | Freq: Once | INTRAMUSCULAR | Status: DC

## 2019-07-27 MED ORDER — INSULIN GLARGINE 100 UNIT/ML ~~LOC~~ SOLN
30.0000 [IU] | Freq: Every day | SUBCUTANEOUS | Status: DC
  Administered 2019-07-27: 30 [IU] via SUBCUTANEOUS
  Filled 2019-07-27 (×3): qty 0.3

## 2019-07-27 NOTE — Progress Notes (Signed)
NAME:  [name], MRN:  [id number], DOB:  12-08-1934, LOS: 8 ADMISSION DATE:  07/19/2019, CONSULTATION DATE:  11/12 REFERRING MD:  Roel Cluck, CHIEF COMPLAINT:  Recurrent respiratory failure   Brief History    84 y/o female was admitted on 11/11 with recurrent hypoxemic/hypercarbic respiratory failure requiring Bipap support in setting of diffuse pulmonary infiltrates of unknown origin  She underwent elective cardioversion for chronic AF on 10/19 and was discharged on 2 L O2.  She was admitted 10/26-11/10 for acute hypoxic respiratory failure with bilateral infiltrates, felt to be pneumonitis of unclear etiology.  Serology negative.  Discharged on 6 L oxygen and prednisone taper  Past Medical History  CKD Stage 3, Anemia of chronic disease,Atrial fibrillation on Elaquis, s/p cardioversion,OSA on CPAP,HFpEF ,HLD,Type II DM HTN,Hypothyroidism,Chronic ITP,PE 2010  Significant Hospital Events   Stopped eliquis 11/13 due to concern with hemoptysis report and ? alv hem on cxr/ ct  Consults:  11/12 >> PCCM  Procedures:   Echo 07/05/19 (p onset of sob/ infiltrates: Grade I diastolic dysfunction with Mild LAE, nl RA  Significant Diagnostic Tests:   10/27 CT Chest >> Widespread ground-glass opacities, small bilat pleural effusions   Micro Data:  11/10 >> Sars-CoV2 neg 11/10 >> MRSA PCR pos 11/11  Urine strep neg 11/11  Urine legionella neg   Note PCT  11/15  Neg    Antimicrobials:  11/11 Vanco  >>11/15 11/11 Cefepime  >>11/15 11/11 Azithromycin >> 11/15  Interim history/subjective:   Afebrile, off warmer No respiratory distress On 55% high flow nasal cannula/25 L   Objective   Blood pressure (!) 143/39, pulse 67, temperature (!) 97 F (36.1 C), temperature source Axillary, resp. rate (!) 26, height _0  (1.549 m), weight 107.2 kg, SpO2 (!) 87 %.    FiO2 (%):  [55 %-65 %] 55 %   Intake/Output Summary (Last 24 hours) at 07/27/2019 1149 Last data filed at 07/27/2019  0530 Gross per 24 hour  Intake -  Output 1350 ml  Net -1350 ml  sats 94% on 50% Filed Weights   07/25/19 0441 07/26/19 0500 07/27/19 1100  Weight: 106.3 kg 108.9 kg 107.2 kg    Examination: Obese elderly, chronically ill-appearing wf nad on high flow cannula, no distress No jvd Oropharynx -dry Neck supple Lungs with distant bs, a few crackles  bilaterally RRR no s3 or or sign murmur Abd obese with limited excursion  Extr warm with no edema or clubbing noted Neuro alert, interactive, recognize me  Chest x-ray 11/18 personally reviewed which shows bilateral vascular congestion and unchanged infiltrates Assessment & Plan:   Recurrent acute on chronic hypoxic respiratory failure Diffuse pulmonary infiltrates of unclear etiology PAF (cardioverted and in NSR since Jun 26 2019) CRI (stage III) Mild systolic (EF 03-00%) and grade I diastolic HF Obesity   OSA H/o ITP ( ROMIPLOSTIM & FERAHEME) Chronic anemia  DM  Pulmonary problem list:  Recurrent acute on chronic hypoxemic  respiratory failure in setting of diffuse patchy infiltrates.  DD  No e/o Infection > Note PCT  11/15  Neg  And all cultures neg  Drug  - no record of any of the usual suspects (chemo, amiodarone/nitrofurantoin)  Serology negative for Connective tissue dz/ BOOP > esr 120 prior to steroids  Hemorrhage on eliquis > d/c'd 11/13  Moderate pulmonary hypertension on echo Unfortunately not a candidate for bronchoscopy or surgical biopsy -very mild improvement in hypoxia although chest x-ray appears unchanged   PLAN: Continue Solu-Medrol 80 every 12  Stay  Off abx  attempt to lower FiO2 to 50% on high flow maintaining saturation 88 % and above Obtain chest x-ray tomorrow  OSA/OHS-maintained on CPAP at home prior to acute illness, continue BiPAP during sleep which she is tolerating well  Steroid-induced hyperglycemia-Per primary  Palliative conversation -we have set care limitations and will continue with  oxygen and steroids hoping for improvement, she has marginal improvement in hypoxia.  She will be full comfort if hypoxia worsens  Kara Mead MD. FCCP. [facility]  If no response to pager , please call [phone]   07/27/2019

## 2019-07-27 NOTE — Progress Notes (Addendum)
Patient CBG 444, paged MD, 15 units novolog verbal order to give. Recheck in one hour. Rechecked CBG. CBG 467, paged MD. 1x order of 20 units novolog to give. STAT BMP ordered.   Patient advised to not drink anymore sugary drinks at this time or to eat anymore orange sherbet.   Daughters also noted white patches on patients tongue. Paged MD to notify them of the thrush. Nystatin ordered.

## 2019-07-27 NOTE — Progress Notes (Signed)
Daily Progress Note   Patient Name: [name]       Date: 07/27/2019 DOB: January 03, 1935  Age: 84 y.o. MRN#: [id number] Attending Physician: Georgette Shell, MD Primary Care Physician: Javier Glazier, MD Admit Date: 07/19/2019  Reason for Consultation/Follow-up: Establishing goals of care  Subjective:  Daughters at bedside today.  Patient much more awake.    Discussed clinical course overnight.  Her daughters remain concerned as she is still requiring high volume O2.  She has been having some shaking and daughters report concern for UTI. Blood and uring cx sent by primary attending already.  Discussed with them multiple possible etiologies of shaking including her need to be on steroids as well as hyperglycemia.  Length of Stay: 8  Current Medications: Scheduled Meds:  . amLODipine  10 mg Oral Daily  . atorvastatin  40 mg Oral QHS  . chlorhexidine  15 mL Mouth Rinse BID  . Chlorhexidine Gluconate Cloth  6 each Topical Daily  . feeding supplement (ENSURE ENLIVE)  237 mL Oral TID BM  . feeding supplement (PRO-STAT SUGAR FREE 64)  30 mL Oral BID  . fenofibrate  54 mg Oral Daily  . insulin aspart  0-20 Units Subcutaneous Q4H  . insulin glargine  30 Units Subcutaneous QHS  . levothyroxine  112 mcg Oral QAC breakfast  . mouth rinse  15 mL Mouth Rinse q12n4p  . methylPREDNISolone (SOLU-MEDROL) injection  80 mg Intravenous Q12H  . multivitamin with minerals  1 tablet Oral Daily  . nystatin  5 mL Oral QID  . pantoprazole  40 mg Oral BID AC  . sertraline  100 mg Oral QPM  . sodium chloride flush  3 mL Intravenous Q12H    Continuous Infusions: . sodium chloride      PRN Meds: sodium chloride, acetaminophen **OR** acetaminophen, albuterol, bisacodyl, hydrALAZINE,  HYDROcodone-acetaminophen, ondansetron **OR** ondansetron (ZOFRAN) IV, sodium chloride, sodium chloride flush  Physical Exam          Remains awake and interactive She has dependent edema Abdomen is also distended Diminished breath sounds  Vital Signs: BP (!) 131/41   Pulse 63   Temp (!) 97.4 F (36.3 C) (Axillary)   Resp 16   Ht 5\' 1"  (1.549 m)   Wt 107.2 kg   LMP  (LMP Unknown)  SpO2 96%   BMI 44.65 kg/m  SpO2: SpO2: 96 % O2 Device: O2 Device: Bi-PAP O2 Flow Rate: O2 Flow Rate (L/min): 25 L/min  Intake/output summary:   Intake/Output Summary (Last 24 hours) at 07/27/2019 2121 Last data filed at 07/27/2019 1800 Gross per 24 hour  Intake 120 ml  Output 400 ml  Net -280 ml   LBM: Last BM Date: 07/18/19 Baseline Weight: Weight: 105.7 kg Most recent weight: Weight: 107.2 kg       Palliative Assessment/Data:      Patient Active Problem List   Diagnosis Date Noted  . Shortness of breath   . Palliative care by specialist   . Goals of care, counseling/discussion   . Pneumonitis 07/14/2019  . Pressure injury of skin 07/10/2019  . HCAP (healthcare-associated pneumonia)   . Acute hypoxemic respiratory failure ([place]) 07/03/2019  . Atypical atrial flutter ([place])   . Normocytic anemia 05/18/2017  . Chronic ITP (idiopathic thrombocytopenia) (HCC) 01/25/2017  . Chronic kidney disease (CKD) stage G4/A1, severely decreased glomerular filtration rate (GFR) between 15-29 mL/min/1.73 square meter and albuminuria creatinine ratio less than 30 mg/g (HCC) 09/04/2015  . Chronic kidney disease 09/04/2015  . Acute respiratory failure with hypoxia ([place]) 08/24/2015  . GOUT 06/23/2010  . DERMATITIS 01/16/2010  . BACK PAIN 06/13/2009  . MYALGIA 06/13/2009  . Anxiety state 12/13/2008  . LEG EDEMA, CHRONIC 11/08/2008  . Sleep apnea 10/26/2008  . Hypothyroidism 09/27/2008  . HYPERCHOLESTEROLEMIA 09/27/2008  . OBESITY 09/27/2008  . DEPRESSION 09/27/2008  . Essential hypertension  09/27/2008  . ALLERGIC RHINITIS 09/27/2008  . ASTHMA, CHILDHOOD 09/27/2008    Palliative Care Assessment & Plan   Patient Profile:  84 yo lady who lives at [place], has 2 daughters, with the following active hospital issues: Recurrent acute on chronic hypoxic respiratory failure Diffuse pulmonary infiltrates of unclear etiology PAF (cardioverted and in NSR since Jun 26 2019) CRI (stage III) Mild systolic (EF 22-29%) and grade I diastolic HF Obesity   OSA H/o ITP ( ROMIPLOSTIM & FERAHEME) Chronic anemia  DM  Assessment:  generalized weakness Shortness of breath Functional decline recently High O2 requirements  Recommendations/Plan: Goals of care discussions are ongoing with the patient and her 2 daughters. They wish to continue with current mode of care for now.  Patient remains and interactive today.  She denies any complaints.  Will continue to follow/advance conversation based on clinical course.   Code Status:    Code Status Orders  (From admission, onward)         Start     Ordered   07/19/19 2131  Do not attempt resuscitation (DNR)  Continuous    Question Answer Comment  In the event of cardiac or respiratory ARREST Do not call a "code blue"   In the event of cardiac or respiratory ARREST Do not perform Intubation, CPR, defibrillation or ACLS   In the event of cardiac or respiratory ARREST Use medication by any route, position, wound care, and other measures to relive pain and suffering. May use oxygen, suction and manual treatment of airway obstruction as needed for comfort.      07/19/19 2130        Code Status History    Date Active Date Inactive Code Status Order ID Comments User Context   07/05/2019 1951 07/19/2019 0014 Partial Code [id number]  Kathi Ludwig, MD ED   07/03/2019 2358 07/05/2019 1951 Partial Code [id number]  Welford Roche, MD ED   07/03/2019 2112 07/03/2019 2358 DNR  [id number]  Welford Roche, MD ED    08/24/2015 2143 08/29/2015 1750 Full Code [id number]  Toy Baker, MD Inpatient   Advance Care Planning Activity    Advance Directive Documentation     Most Recent Value  Type of Advance Directive  Living will, Healthcare Power of Attorney  Pre-existing out of facility DNR order (yellow form or pink MOST form)  Yellow form placed in chart (order not valid for inpatient use)  "MOST" Form in Place?  -       Prognosis:   guarded   Discharge Planning:  To Be Determined  Care plan was discussed with  Patient, RN, daughters, TRH MD.   Thank you for allowing the Palliative Medicine Team to assist in the care of this patient.   Total Time 30 Prolonged Time Billed  no       Greater than 50%  of this time was spent counseling and coordinating care related to the above assessment and plan.  Micheline Rough, MD  Please contact Palliative Medicine Team phone at [phone] for questions and concerns.

## 2019-07-27 NOTE — Progress Notes (Addendum)
PROGRESS NOTE    [name]  MHD:[id number] DOB: 08/13/1935 DOA: 07/19/2019 PCP: Javier Glazier, MD   Brief Narrative: 84 y.o.femalewith medical history significant of chronic ITP, CKD stage3, anemia of chronic disease, A.fib on Eliquis,sleep apnea on CPAP history of CHF HLD history of pulmonary embolism in 2010 diabetes type 2, HTN,hypothyroidism, pressure ulcer Presented withworsening shortness of breathfromSNF. Multiple recent admissions in October, most recentlyshe was discharged to SNF 1 day ago from [facility] where she was treated for acute respiratory failure and thought to be due to pneumonitis and was sent home with tapering dose of steroid. She was having increased oxygen requirement, initially managed with nonrebreather. BiPAP was startedlaterdue to worsening respiratory status. Chest x-ray with worsening of bilateral infiltrate, broader differential which includes pneumonitis, bilateral pneumonia, ARDS, pulmonary vascular congestion. She was started on broad-spectrum antibiotics,Lasix and steroids. Antibiotics were discontinued after having normal procalcitonin multiple times. Continue steroid and give Lasix as needed. Currently appears dry. PCCM is on board and does not think that there is much to offer for her.  Assessment & Plan:   Principal Problem:   Acute respiratory failure with hypoxia (HCC) Active Problems:   Hypothyroidism   HYPERCHOLESTEROLEMIA   OBESITY   Essential hypertension   Chronic kidney disease (CKD) stage G4/A1, severely decreased glomerular filtration rate (GFR) between 15-29 mL/min/1.73 square meter and albuminuria creatinine ratio less than 30 mg/g (HCC)   Chronic kidney disease   Chronic ITP (idiopathic thrombocytopenia) (HCC)   Normocytic anemia   Atypical atrial flutter (HCC)   HCAP (healthcare-associated pneumonia)   Pneumonitis   Shortness of breath   Palliative care by specialist   Goals of care,  counseling/discussion   #1 acute on chronic hypoxic and hypercapnic respiratory failure of unclear etiology-prior to admission patient was not on oxygen .she is now on 25 L of oxygen with saturation 99%.  Chest x-ray today still can consistent with fluid overload and groundglass opacities.  She received 1 dose of Lasix yesterday we will give her Lasix 40 mg twice a day today.   On IV steroids Lasix 40 mg x 1 Negative over 4.5 L Palliative care and PCCM following appreciate their input discussed with both daughters.  #2 hypothermia resolved  #3 hypertension continue amlodipine and hydralazine as needed  #4 AKI on CKD stage III creatinine stable follow-up tomorrow  #5 diastolic heart failure with mild systolic heart failure Lasix 1 dose today  #6 chronic ITP oncology following stable  #7 type 2 diabetes blood pressure elevated increase Lantus to 30 units nightly.   #8 atrial fibrillation chronic status post cardioversion 06/26/2019 now in sinus rhythm.  Eliquis discontinued due to recent hemoptysis 07/21/2019.  #9 hypothyroidism continue Synthroid.  #10 obstructive sleep apnea on BiPAP  #11 hypernatremia monitor closely  #12 depression continue Zoloft  #13 rule out UTI patient reports burning urination and having chills and shaking and rigors.  I will get a urine culture and blood culture.  #14 hypernatremia increased since the Lasix was given.  Will hold Lasix for now.  Pressure Injury 07/08/19 Buttocks Right;Left;Medial;Lower Stage II -  Partial thickness loss of dermis presenting as a shallow open ulcer with a red, pink wound bed without slough. Red, nonblanchable, (Active)  07/08/19 1000  Location: Buttocks  Location Orientation: Right;Left;Medial;Lower  Staging: Stage II -  Partial thickness loss of dermis presenting as a shallow open ulcer with a red, pink wound bed without slough.  Wound Description (Comments): Red, nonblanchable,  Present on  Admission: Yes       Nutrition Problem: Increased nutrient needs Etiology: chronic illness(CHF)     Signs/Symptoms: estimated needs    Interventions: Glucerna shake, Prostat, MVI  Estimated body mass index is 44.65 kg/m as calculated from the following:   Height as of this encounter: 5\' 1"  (1.549 m).   Weight as of this encounter: 107.2 kg. DVT prophylaxis: SCDs due to thrombocytopenia  code Status: DO NOT RESUSCITATE  family Communication: Discussed with both daughters face-to-face in the patient's room  disposition Plan: Pending clinical improvement  Consultants:   PCCM oncology and palliative care  Procedures: None Antimicrobials: None   Subjective:  Patient is resting in bed she is awake and alert and smiling There were no acute events overnight. Objective: Vitals:   07/27/19 1300 07/27/19 1400 07/27/19 1500 07/27/19 1509  BP:  (!) 168/38    Pulse: 72 72 67 76  Resp: (!) 24 (!) 23 (!) 22 (!) 24  Temp:      TempSrc:      SpO2: 94% 92% 96% 99%  Weight:      Height:        Intake/Output Summary (Last 24 hours) at 07/27/2019 1516 Last data filed at 07/27/2019 1200 Gross per 24 hour  Intake 0 ml  Output 1350 ml  Net -1350 ml   Filed Weights   07/25/19 0441 07/26/19 0500 07/27/19 1100  Weight: 106.3 kg 108.9 kg 107.2 kg    Examination:  General exam: Appears calm and comfortable  Respiratory system: Clear to auscultation. Respiratory effort normal. Cardiovascular system: S1 & S2 heard, RRR. No JVD, murmurs, rubs, gallops or clicks. No pedal edema. Gastrointestinal system: Abdomen is nondistended, soft and nontender. No organomegaly or masses felt. Normal bowel sounds heard. Central nervous system: Alert and oriented. No focal neurological deficits. Extremities 2+ edema Skin: No rashes, lesions or ulcers Psychiatry: Judgement and insight appear normal. Mood & affect appropriate.     Data Reviewed: I have personally reviewed following labs and imaging studies   CBC: Recent Labs  Lab 07/22/19 0204 07/23/19 0242 07/24/19 0237 07/25/19 0214 07/26/19 0151  WBC 14.5* 15.2* 10.0 10.3 8.7  HGB 8.8* 8.8* 8.2* 8.1* 8.0*  HCT 30.0* 30.9* 28.6* 28.2* 28.3*  MCV 99.3 102.3* 102.9* 101.8* 103.3*  PLT 214 203 146* 152 283   Basic Metabolic Panel: Recent Labs  Lab 07/21/19 0243  07/23/19 0242 07/23/19 1421 07/24/19 0237 07/25/19 0214 07/26/19 0151 07/27/19 0749  NA 147*   < > 152* 147* 149* 149* 149* 152*  K 4.9   < > 4.4 4.7 4.5 5.0 4.8 4.9  CL 97*   < > 105 102 103 107 107 107  CO2 37*   < > 38* 35* 38* 36* 35* 33*  GLUCOSE 280*   < > 203* 321* 196* 142* 193* 181*  BUN 105*   < > 96* 97* 98* 91* 92* 95*  CREATININE 2.16*   < > 1.96*  1.81* 2.02* 2.03* 1.94* 2.02* 2.02*  CALCIUM 9.7   < > 9.8 9.6 9.1 9.4 9.3 9.8  PHOS 5.0*  --  4.6  --  4.8* 4.2 4.6  --    < > = values in this interval not displayed.   GFR: Estimated Creatinine Clearance: 24.7 mL/min (A) (by C-G formula based on SCr of 2.02 mg/dL (H)). Liver Function Tests: Recent Labs  Lab 07/21/19 0243 07/23/19 0242 07/24/19 0237 07/25/19 0214 07/26/19 0151  ALBUMIN 3.2* 3.5 3.2* 3.1* 3.1*  No results for input(s): LIPASE, AMYLASE in the last 168 hours. No results for input(s): AMMONIA in the last 168 hours. Coagulation Profile: No results for input(s): INR, PROTIME in the last 168 hours. Cardiac Enzymes: No results for input(s): CKTOTAL, CKMB, CKMBINDEX, TROPONINI in the last 168 hours. BNP (last 3 results) No results for input(s): PROBNP in the last 8760 hours. HbA1C: No results for input(s): HGBA1C in the last 72 hours. CBG: Recent Labs  Lab 07/26/19 2004 07/26/19 2318 07/27/19 0316 07/27/19 0803 07/27/19 1119  GLUCAP 290* 225* 190* 161* 352*   Lipid Profile: No results for input(s): CHOL, HDL, LDLCALC, TRIG, CHOLHDL, LDLDIRECT in the last 72 hours. Thyroid Function Tests: No results for input(s): TSH, T4TOTAL, FREET4, T3FREE, THYROIDAB in the last 72 hours.  Anemia Panel: No results for input(s): VITAMINB12, FOLATE, FERRITIN, TIBC, IRON, RETICCTPCT in the last 72 hours. Sepsis Labs: Recent Labs  Lab 07/22/19 1446 07/23/19 0242 07/24/19 0237  PROCALCITON <0.10 <0.10 0.16    Recent Results (from the past 240 hour(s))  SARS CORONAVIRUS 2 (TAT 6-24 HRS) Nasopharyngeal Nasopharyngeal Swab     Status: None   Collection Time: 07/18/19 10:11 AM   Specimen: Nasopharyngeal Swab  Result Value Ref Range Status   SARS Coronavirus 2 NEGATIVE NEGATIVE Final    Comment: (NOTE) SARS-CoV-2 target nucleic acids are NOT DETECTED. The SARS-CoV-2 RNA is generally detectable in upper and lower respiratory specimens during the acute phase of infection. Negative results do not preclude SARS-CoV-2 infection, do not rule out co-infections with other pathogens, and should not be used as the sole basis for treatment or other patient management decisions. Negative results must be combined with clinical observations, patient history, and epidemiological information. The expected result is Negative. Fact Sheet for Patients: SugarRoll.be Fact Sheet for Healthcare Providers: https://www.woods-mathews.com/ This test is not yet approved or cleared by the [place] FDA and  has been authorized for detection and/or diagnosis of SARS-CoV-2 by FDA under an Emergency Use Authorization (EUA). This EUA will remain  in effect (meaning this test can be used) for the duration of the COVID-19 declaration under Section 56 4(b)(1) of the Act, 21 U.S.C. section 360bbb-3(b)(1), unless the authorization is terminated or revoked sooner. Performed at [facility], [place] [address] [place], [place] [address]   MRSA PCR Screening     Status: Abnormal   Collection Time: 07/19/19  9:31 PM   Specimen: Nasal Mucosa; Nasopharyngeal  Result Value Ref Range Status   MRSA by PCR POSITIVE (A) NEGATIVE Final    Comment:        The  GeneXpert MRSA Assay (FDA approved for NASAL specimens only), is one component of a comprehensive MRSA colonization surveillance program. It is not intended to diagnose MRSA infection nor to guide or monitor treatment for MRSA infections. RESULT CALLED TO, READ BACK BY AND VERIFIED WITH: ROTHERMEL,A @ [id number] UU [id number] BY POTEAT,S Performed at [facility], [place] [address], [place], [place] [address]   Culture, blood (routine x 2)     Status: None (Preliminary result)   Collection Time: 07/24/19  1:07 PM   Specimen: BLOOD  Result Value Ref Range Status   Specimen Description   Final    BLOOD LEFT ARM Performed at [place] [address] [place], [place] [address]    Special Requests   Final    BOTTLES DRAWN AEROBIC AND ANAEROBIC Blood Culture results may not be optimal due to an inadequate volume of blood  received in culture bottles Performed at [facility], [place] [address], [place], [place] [address]    Culture   Final    NO GROWTH 3 DAYS Performed at [facility], [place] [address], [place], [place] [address]    Report Status PENDING  Incomplete  Culture, blood (routine x 2)     Status: None (Preliminary result)   Collection Time: 07/24/19  1:14 PM   Specimen: BLOOD  Result Value Ref Range Status   Specimen Description   Final    BLOOD LEFT ARM Performed at [place] [address], [place], [place] [address]    Special Requests   Final    BOTTLES DRAWN AEROBIC AND ANAEROBIC Blood Culture adequate volume Performed at [place] [address] [place], [place] [address]    Culture   Final    NO GROWTH 3 DAYS Performed at [facility], [place] [address], [place], [place] [address]    Report Status PENDING  Incomplete         Radiology Studies: Dg Chest 1 View  Result Date: 07/27/2019 CLINICAL DATA:  84 year old female with shortness of breath. EXAM:  CHEST  1 VIEW COMPARISON:  Chest radiograph dated 07/26/2019. FINDINGS: No significant interval change in bilateral interstitial and airspace densities and small bilateral pleural effusions likely representing CHF. Pneumonia is not excluded. Clinical correlation is recommended. Stable cardiomediastinal silhouette. Atherosclerotic calcification of the aorta. No pneumothorax. No acute osseous pathology. IMPRESSION: CHF with no significant interval change since the prior radiograph. Follow-up recommended. Electronically Signed   By: Anner Crete M.D.   On: 07/27/2019 09:05   Dg Chest Port 1 View  Result Date: 07/26/2019 CLINICAL DATA:  84 year old female with acute respiratory failure. EXAM: PORTABLE CHEST 1 VIEW COMPARISON:  Chest radiograph dated 07/24/2019. FINDINGS: Interval worsening of the interstitial and airspace disease since the prior radiograph likely represent worsening CHF, pneumonia, or ARDS. Clinical correlation is recommended. Small bilateral pleural effusions as seen previously. No pneumothorax. Stable cardiomegaly. Atherosclerotic calcification of the aorta. No acute osseous pathology. IMPRESSION: 1. Interval worsening of the interstitial and airspace disease compared to the prior radiograph. 2. Stable cardiomegaly. 3. Small bilateral pleural effusions. Electronically Signed   By: Anner Crete M.D.   On: 07/26/2019 11:10        Scheduled Meds: . amLODipine  10 mg Oral Daily  . atorvastatin  40 mg Oral QHS  . chlorhexidine  15 mL Mouth Rinse BID  . Chlorhexidine Gluconate Cloth  6 each Topical Daily  . feeding supplement (ENSURE ENLIVE)  237 mL Oral TID BM  . feeding supplement (PRO-STAT SUGAR FREE 64)  30 mL Oral BID  . fenofibrate  54 mg Oral Daily  . furosemide  40 mg Intravenous Once  . insulin aspart  0-20 Units Subcutaneous Q4H  . insulin glargine  25 Units Subcutaneous QHS  . levothyroxine  112 mcg Oral QAC breakfast  . mouth rinse  15 mL Mouth Rinse q12n4p  .  methylPREDNISolone (SOLU-MEDROL) injection  80 mg Intravenous Q12H  . multivitamin with minerals  1 tablet Oral Daily  . pantoprazole  40 mg Oral BID AC  . sertraline  100 mg Oral QPM  . sodium chloride flush  3 mL Intravenous Q12H   Continuous Infusions: . sodium chloride       LOS: 8 days     Georgette Shell, MD [facility]  If 7PM-7AM, please contact night-coverage [email or web address] Password  [email or web address] 07/27/2019, 3:16 PM

## 2019-07-28 ENCOUNTER — Inpatient Hospital Stay (HOSPITAL_COMMUNITY): Payer: Medicare Other

## 2019-07-28 LAB — URINALYSIS, ROUTINE W REFLEX MICROSCOPIC
Bilirubin Urine: NEGATIVE
Glucose, UA: 50 mg/dL — AB
Hgb urine dipstick: NEGATIVE
Ketones, ur: NEGATIVE mg/dL
Leukocytes,Ua: NEGATIVE
Nitrite: NEGATIVE
Protein, ur: NEGATIVE mg/dL
Specific Gravity, Urine: 1.016 (ref 1.005–1.030)
pH: 6 (ref 5.0–8.0)

## 2019-07-28 LAB — BASIC METABOLIC PANEL
Anion gap: 11 (ref 5–15)
BUN: 99 mg/dL — ABNORMAL HIGH (ref 8–23)
CO2: 37 mmol/L — ABNORMAL HIGH (ref 22–32)
Calcium: 10.2 mg/dL (ref 8.9–10.3)
Chloride: 109 mmol/L (ref 98–111)
Creatinine, Ser: 2.16 mg/dL — ABNORMAL HIGH (ref 0.44–1.00)
GFR calc Af Amer: 24 mL/min — ABNORMAL LOW (ref >60)
GFR calc non Af Amer: 21 mL/min — ABNORMAL LOW (ref >60)
Glucose, Bld: 89 mg/dL (ref 70–99)
Potassium: 5.3 mmol/L — ABNORMAL HIGH (ref 3.5–5.1)
Sodium: 157 mmol/L — ABNORMAL HIGH (ref 135–145)

## 2019-07-28 LAB — GLUCOSE, CAPILLARY
Glucose-Capillary: 140 mg/dL — ABNORMAL HIGH (ref 70–99)
Glucose-Capillary: 152 mg/dL — ABNORMAL HIGH (ref 70–99)
Glucose-Capillary: 182 mg/dL — ABNORMAL HIGH (ref 70–99)
Glucose-Capillary: 192 mg/dL — ABNORMAL HIGH (ref 70–99)
Glucose-Capillary: 241 mg/dL — ABNORMAL HIGH (ref 70–99)
Glucose-Capillary: 81 mg/dL (ref 70–99)

## 2019-07-28 LAB — URINE CULTURE: Culture: <10000 — AB

## 2019-07-28 MED ORDER — METHYLPREDNISOLONE SODIUM SUCC 125 MG IJ SOLR
60.0000 mg | Freq: Two times a day (BID) | INTRAMUSCULAR | Status: DC
  Administered 2019-07-28 – 2019-07-30 (×4): 60 mg via INTRAVENOUS
  Filled 2019-07-28 (×4): qty 2

## 2019-07-28 MED ORDER — INSULIN GLARGINE 100 UNIT/ML ~~LOC~~ SOLN
25.0000 [IU] | Freq: Every day | SUBCUTANEOUS | Status: DC
  Administered 2019-07-28: 25 [IU] via SUBCUTANEOUS
  Filled 2019-07-28 (×2): qty 0.25

## 2019-07-28 NOTE — Progress Notes (Signed)
Daily Progress Note   Patient Name: [name]       Date: 07/28/2019 DOB: 01-14-1935  Age: 84 y.o. MRN#: [id number] Attending Physician: Georgette Shell, MD Primary Care Physician: Javier Glazier, MD Admit Date: 07/19/2019  Reason for Consultation/Follow-up: Establishing goals of care  Subjective:  Daughter [name] at bedside today.  Also spoke with daughter [name] via phone.  [name] was sleeping on Bipap at the time of my visit.    Discussed again her clinical course and that her daughters feel she is not really improving, but not declining either.  We talked about the benefits and burdens of medical interventions and they feel they need realistic understanding of expectations moving forward.  [name] stated that she would "not want to give up if she can get better, but right now it just seems she is stuck."    They asked about high flow and options for this outside of hospital environment.  Discussed that this is really a hospital based intervention and could not be replicated at home or even at residential hospice [facility] can supply up to 15L).  Length of Stay: 9  Current Medications: Scheduled Meds:  . amLODipine  10 mg Oral Daily  . atorvastatin  40 mg Oral QHS  . chlorhexidine  15 mL Mouth Rinse BID  . Chlorhexidine Gluconate Cloth  6 each Topical Daily  . feeding supplement (ENSURE ENLIVE)  237 mL Oral TID BM  . feeding supplement (PRO-STAT SUGAR FREE 64)  30 mL Oral BID  . fenofibrate  54 mg Oral Daily  . insulin aspart  0-20 Units Subcutaneous Q4H  . insulin glargine  25 Units Subcutaneous QHS  . levothyroxine  112 mcg Oral QAC breakfast  . mouth rinse  15 mL Mouth Rinse q12n4p  . methylPREDNISolone (SOLU-MEDROL) injection  60 mg Intravenous Q12H  .  multivitamin with minerals  1 tablet Oral Daily  . nystatin  5 mL Oral QID  . pantoprazole  40 mg Oral BID AC  . sertraline  100 mg Oral QPM  . sodium chloride flush  3 mL Intravenous Q12H    Continuous Infusions: . sodium chloride      PRN Meds: sodium chloride, acetaminophen **OR** acetaminophen, albuterol, bisacodyl, hydrALAZINE, HYDROcodone-acetaminophen, ondansetron **OR** ondansetron (ZOFRAN) IV, sodium chloride, sodium chloride flush  Physical Exam          Remains awake and interactive She has dependent edema Abdomen is also distended Diminished breath sounds  Vital Signs: BP (!) 153/53   Pulse 68   Temp 97.7 F (36.5 C) (Oral)   Resp (!) 25   Ht 5\' 1"  (1.549 m)   Wt 105.7 kg   LMP  (LMP Unknown)   SpO2 94%   BMI 44.03 kg/m  SpO2: SpO2: 94 % O2 Device: O2 Device: High Flow Nasal Cannula O2 Flow Rate: O2 Flow Rate (L/min): 20 L/min  Intake/output summary:   Intake/Output Summary (Last 24 hours) at 07/28/2019 1932 Last data filed at 07/28/2019 1800 Gross per 24 hour  Intake 600 ml  Output 700 ml  Net -100 ml   LBM: Last BM Date: 07/28/19 Baseline Weight: Weight: 105.7 kg Most recent weight: Weight: 105.7 kg       Palliative Assessment/Data:      Patient Active Problem List   Diagnosis Date Noted  . Shortness of breath   . Palliative care by specialist   . Goals of care, counseling/discussion   . Pneumonitis 07/14/2019  . Pressure injury of skin 07/10/2019  . HCAP (healthcare-associated pneumonia)   . Acute hypoxemic respiratory failure ([place]) 07/03/2019  . Atypical atrial flutter ([place])   . Normocytic anemia 05/18/2017  . Chronic ITP (idiopathic thrombocytopenia) (HCC) 01/25/2017  . Chronic kidney disease (CKD) stage G4/A1, severely decreased glomerular filtration rate (GFR) between 15-29 mL/min/1.73 square meter and albuminuria creatinine ratio less than 30 mg/g (HCC) 09/04/2015  . Chronic kidney disease 09/04/2015  . Acute respiratory failure  with hypoxia ([place]) 08/24/2015  . GOUT 06/23/2010  . DERMATITIS 01/16/2010  . BACK PAIN 06/13/2009  . MYALGIA 06/13/2009  . Anxiety state 12/13/2008  . LEG EDEMA, CHRONIC 11/08/2008  . Sleep apnea 10/26/2008  . Hypothyroidism 09/27/2008  . HYPERCHOLESTEROLEMIA 09/27/2008  . OBESITY 09/27/2008  . DEPRESSION 09/27/2008  . Essential hypertension 09/27/2008  . ALLERGIC RHINITIS 09/27/2008  . ASTHMA, CHILDHOOD 09/27/2008    Palliative Care Assessment & Plan   Patient Profile:  84 yo lady who lives at [facility], has 2 daughters, with the following active hospital issues: Recurrent acute on chronic hypoxic respiratory failure Diffuse pulmonary infiltrates of unclear etiology PAF (cardioverted and in NSR since Jun 26 2019) CRI (stage III) Mild systolic (EF 92-11%) and grade I diastolic HF Obesity   OSA H/o ITP ( ROMIPLOSTIM & FERAHEME) Chronic anemia  DM  Assessment:  generalized weakness Shortness of breath Functional decline recently High O2 requirements  Recommendations/Plan: Goals of care discussions are ongoing with the patient and her 2 daughters. They wish to continue with current mode of care for now.  Patient remains and interactive today.  She denies any complaints.  Will continue to follow/advance conversation based on clinical course.  Her daughters are looking for guidance as to realistic possibility of her making enough improvement to live outside of hospital environment.  Will discuss tomorrow with PCCM.   Code Status:    Code Status Orders  (From admission, onward)         Start     Ordered   07/19/19 2131  Do not attempt resuscitation (DNR)  Continuous    Question Answer Comment  In the event of cardiac or respiratory ARREST Do not call a "code blue"   In the event of cardiac or respiratory ARREST Do not perform Intubation, CPR, defibrillation or ACLS   In the event of  cardiac or respiratory ARREST Use medication by any route, position,  wound care, and other measures to relive pain and suffering. May use oxygen, suction and manual treatment of airway obstruction as needed for comfort.      07/19/19 2130        Code Status History    Date Active Date Inactive Code Status Order ID Comments User Context   07/05/2019 1951 07/19/2019 0014 Partial Code [id number]  Kathi Ludwig, MD ED   07/03/2019 2358 07/05/2019 1951 Partial Code [id number]  Welford Roche, MD ED   07/03/2019 2112 07/03/2019 2358 DNR [id number]  Welford Roche, MD ED   08/24/2015 2143 08/29/2015 1750 Full Code [id number]  Toy Baker, MD Inpatient   Advance Care Planning Activity    Advance Directive Documentation     Most Recent Value  Type of Advance Directive  Living will, Healthcare Power of Attorney  Pre-existing out of facility DNR order (yellow form or pink MOST form)  Yellow form placed in chart (order not valid for inpatient use)  "MOST" Form in Place?  -       Prognosis:   guarded   Discharge Planning:  To Be Determined  Care plan was discussed with  Patient, RN, daughters, TRH MD.   Thank you for allowing the Palliative Medicine Team to assist in the care of this patient.   Total Time 30 Prolonged Time Billed  no       Greater than 50%  of this time was spent counseling and coordinating care related to the above assessment and plan.  Micheline Rough, MD  Please contact Palliative Medicine Team phone at [phone] for questions and concerns.

## 2019-07-28 NOTE — Progress Notes (Signed)
Inpatient Diabetes Program Recommendations  AACE/ADA: New Consensus Statement on Inpatient Glycemic Control (2015)  Target Ranges:  Prepandial:   less than 140 mg/dL      Peak postprandial:   less than 180 mg/dL (1-2 hours)      Critically ill patients:  140 - 180 mg/dL   Lab Results  Component Value Date   GLUCAP 81 07/28/2019   HGBA1C 7.3 (H) 07/19/2019    Review of Glycemic Control Results for [name], [name] (MRN [id number]) as of 07/28/2019 10:55  Ref. Range 07/27/2019 08:03 07/27/2019 11:19 07/27/2019 15:59 07/27/2019 17:48 07/27/2019 20:22 07/27/2019 22:57 07/28/2019 00:10 07/28/2019 04:20 07/28/2019 07:54  Glucose-Capillary Latest Ref Range: 70 - 99 mg/dL 161 (H) 352 (H) 444 (H) 467 (H) 312 (H) 257 (H) 241 (H) 140 (H) 81   Diabetes history: DM 2 Outpatient Diabetes medications: Humalog 35-30-25 units tidwc + meal coverage of 17, 15, 12 units tidwc if pt eats regular meal Current orders for Inpatient glycemic control:  Lantus 30 units Novolog 0-20 units Q4 hours  Solumedrol decreased from 80 to 60 mg Q12 hours Ensure Enlive tid between meals BUN/Creat: 99/2.16 Hgb 8 A1c 7.3%, not accurate due to Hgb  Inpatient Diabetes Program Recommendations:    Fasting glucose 81 this am. Glucose increase after PO intake, Pt on Ensure between meals.  -  Decrease Lantus back down to 25 units.  -  Decrease Correction scale to Novolog 0-15 units tid + hs scale  -Consider Novolog 4 units tid meal coverage if pt eating >50% of meals/supplements.  Thanks,  Tama Headings RN, MSN, BC-ADM Inpatient Diabetes Coordinator Team Pager [phone] (8a-5p)

## 2019-07-28 NOTE — Progress Notes (Signed)
PT Cancellation Note  Patient Details Name: [name] MRN: [id number] DOB: 09/25/1934   Cancelled Treatment:    Reason Eval/Treat Not Completed: Other (comment); RN providing hygiene care, will attempt again as PT schedule allows   [facility] 07/28/2019, 11:25 AM

## 2019-07-28 NOTE — Progress Notes (Signed)
PROGRESS NOTE    [name]  XBD:[id number] DOB: [birth year] DOA: 07/19/2019 PCP: Javier Glazier, MD   Brief Narrative: 84 y.o.femalewith medical history significant of chronic ITP, CKD stage3, anemia of chronic disease, A.fib on Eliquis,sleep apnea on CPAP history of CHF HLD history of pulmonary embolism in 2010 diabetes type 2, HTN,hypothyroidism, pressure ulcer Presented withworsening shortness of breathfromSNF. Multiple recent admissions in October, most recentlyshe was discharged to SNF 1 day ago from [facility] where she was treated for acute respiratory failure and thought to be due to pneumonitis and was sent home with tapering dose of steroid. She was having increased oxygen requirement, initially managed with nonrebreather. BiPAP was startedlaterdue to worsening respiratory status. Chest x-ray with worsening of bilateral infiltrate, broader differential which includes pneumonitis, bilateral pneumonia, ARDS, pulmonary vascular congestion. She was started on broad-spectrum antibiotics,Lasix and steroids. Antibiotics were discontinued after having normal procalcitonin multiple times. Continue steroid and give Lasix as needed. Currently appears dry. PCCM is on board and does not think that there is much to offer for her.  Assessment & Plan:   Principal Problem:   Acute respiratory failure with hypoxia (HCC) Active Problems:   Hypothyroidism   HYPERCHOLESTEROLEMIA   OBESITY   Essential hypertension   Chronic kidney disease (CKD) stage G4/A1, severely decreased glomerular filtration rate (GFR) between 15-29 mL/min/1.73 square meter and albuminuria creatinine ratio less than 30 mg/g (HCC)   Chronic kidney disease   Chronic ITP (idiopathic thrombocytopenia) (HCC)   Normocytic anemia   Atypical atrial flutter (HCC)   HCAP (healthcare-associated pneumonia)   Pneumonitis   Shortness of breath   Palliative care by specialist   Goals of care,  counseling/discussion   #1 acute on chronic hypoxic and hypercapnic respiratory failure of unclear etiology-prior to admission patient was not on oxygen .she is now on 25 L of oxygen with saturation 99%.  Chest x-ray still with evidence of fluid overload and groundglass opacities.  She received 1 dose of Lasix yesterday morning.  Thereafter the Lasix was held due to worsening hypernatremia. On IV steroids decreased to 60 mg twice a day. Lasix 40 mg x 1 Negative over 5.4 L Palliative care and PCCM following appreciate their input discussed with both daughters.  #2 hypothermia resolved  #3 hypertension continue amlodipine and hydralazine as needed.  Blood pressure 147/41.  #4 AKI on CKD stage III creatinine mildly elevated compared to yesterday likely secondary to diuresis.  Follow-up tomorrow  #5 diastolic heart failure with mild systolic heart failure -no diuresis due to hypernatremia  #6 chronic ITP oncology following stable follow-up CBC tomorrow  #7 type 2 diabetes blood sugar decreased after decreasing the intake of orange short of breath.  Decrease Lantus to 25 units nightly.    #8 atrial fibrillation chronic status post cardioversion 06/26/2019 now in sinus rhythm. Eliquis discontinued due to recent hemoptysis 07/21/2019.  #9 hypothyroidism continue Synthroid.  #10 obstructive sleep apnea on BiPAP  #11 hypernatremia monitor closely sodium 157.  She is awake alert oriented.  Mental status stable.encourage water intake.  #12 depression continue Zoloft  #13 rule out UTI patient reports burning urination and having chills and shaking and rigors.  I will get a urine culture and blood culture.  #14 hypernatremia increased since the Lasix was given.  Will hold Lasix for now.  Pressure Injury 07/08/19 Buttocks Right;Left;Medial;Lower Stage II -  Partial thickness loss of dermis presenting as a shallow open ulcer with a red, pink wound bed without slough. Red,  nonblanchable, (Active)  07/08/19 1000  Location: Buttocks  Location Orientation: Right;Left;Medial;Lower  Staging: Stage II -  Partial thickness loss of dermis presenting as a shallow open ulcer with a red, pink wound bed without slough.  Wound Description (Comments): Red, nonblanchable,  Present on Admission: Yes      Nutrition Problem: Increased nutrient needs Etiology: chronic illness(CHF)     Signs/Symptoms: estimated needs    Interventions: Glucerna shake, Prostat, MVI  Estimated body mass index is 44.03 kg/m as calculated from the following:   Height as of this encounter: 5\' 1"  (1.549 m).   Weight as of this encounter: 105.7 kg.  DVT prophylaxis:SCDs due to thrombocytopenia  code Status:DO NOT RESUSCITATE  family Communication:Discussed with both daughters face-to-face in the patient's room  disposition Plan:Pending clinical improvement  Consultants:  PCCM oncology and palliative care  Procedures:None Antimicrobials:None  Subjective: Resting in bed awake alert smiling denies any new complaints no nausea vomiting  Objective: Vitals:   07/28/19 0500 07/28/19 0600 07/28/19 0815 07/28/19 1042  BP:  (!) 148/36  (!) 147/41  Pulse:  71    Resp: 19 (!) 24    Temp:   (!) 97.5 F (36.4 C)   TempSrc:   Axillary   SpO2:  93%    Weight:      Height:        Intake/Output Summary (Last 24 hours) at 07/28/2019 1211 Last data filed at 07/28/2019 0600 Gross per 24 hour  Intake 120 ml  Output 700 ml  Net -580 ml   Filed Weights   07/26/19 0500 07/27/19 1100 07/28/19 0444  Weight: 108.9 kg 107.2 kg 105.7 kg    Examination:  General exam: Appears calm and comfortable  Respiratory system: few rhonchi diminished at the bases to auscultation. Respiratory effort normal. Cardiovascular system: S1 & S2 heard, RRR. No JVD, murmurs, rubs, gallops or clicks. No pedal edema. Gastrointestinal system: Abdomen is nondistended, soft and nontender. No  organomegaly or masses felt. Normal bowel sounds heard. Central nervous system: Alert and oriented. No focal neurological deficits. Extremities:2 plus edema Skin: No rashes, lesions or ulcers Psychiatry: Judgement and insight appear normal. Mood & affect appropriate.     Data Reviewed: I have personally reviewed following labs and imaging studies  CBC: Recent Labs  Lab 07/22/19 0204 07/23/19 0242 07/24/19 0237 07/25/19 0214 07/26/19 0151  WBC 14.5* 15.2* 10.0 10.3 8.7  HGB 8.8* 8.8* 8.2* 8.1* 8.0*  HCT 30.0* 30.9* 28.6* 28.2* 28.3*  MCV 99.3 102.3* 102.9* 101.8* 103.3*  PLT 214 203 146* 152 338   Basic Metabolic Panel: Recent Labs  Lab 07/23/19 0242  07/24/19 0237 07/25/19 0214 07/26/19 0151 07/27/19 0749 07/27/19 1816 07/28/19 0736  NA 152*   < > 149* 149* 149* 152* 152* 157*  K 4.4   < > 4.5 5.0 4.8 4.9 4.9 5.3*  CL 105   < > 103 107 107 107 104 109  CO2 38*   < > 38* 36* 35* 33* 38* 37*  GLUCOSE 203*   < > 196* 142* 193* 181* 461* 89  BUN 96*   < > 98* 91* 92* 95* 99* 99*  CREATININE 1.96*  1.81*   < > 2.03* 1.94* 2.02* 2.02* 2.10* 2.16*  CALCIUM 9.8   < > 9.1 9.4 9.3 9.8 9.7 10.2  PHOS 4.6  --  4.8* 4.2 4.6  --   --   --    < > = values in this interval not displayed.   GFR:  Estimated Creatinine Clearance: 22.9 mL/min (A) (by C-G formula based on SCr of 2.16 mg/dL (H)). Liver Function Tests: Recent Labs  Lab 07/23/19 0242 07/24/19 0237 07/25/19 0214 07/26/19 0151  ALBUMIN 3.5 3.2* 3.1* 3.1*   No results for input(s): LIPASE, AMYLASE in the last 168 hours. No results for input(s): AMMONIA in the last 168 hours. Coagulation Profile: No results for input(s): INR, PROTIME in the last 168 hours. Cardiac Enzymes: No results for input(s): CKTOTAL, CKMB, CKMBINDEX, TROPONINI in the last 168 hours. BNP (last 3 results) No results for input(s): PROBNP in the last 8760 hours. HbA1C: No results for input(s): HGBA1C in the last 72 hours. CBG: Recent Labs   Lab 07/27/19 2257 07/28/19 0010 07/28/19 0420 07/28/19 0754 07/28/19 1149  GLUCAP 257* 241* 140* 81 152*   Lipid Profile: No results for input(s): CHOL, HDL, LDLCALC, TRIG, CHOLHDL, LDLDIRECT in the last 72 hours. Thyroid Function Tests: No results for input(s): TSH, T4TOTAL, FREET4, T3FREE, THYROIDAB in the last 72 hours. Anemia Panel: No results for input(s): VITAMINB12, FOLATE, FERRITIN, TIBC, IRON, RETICCTPCT in the last 72 hours. Sepsis Labs: Recent Labs  Lab 07/22/19 1446 07/23/19 0242 07/24/19 0237  PROCALCITON <0.10 <0.10 0.16    Recent Results (from the past 240 hour(s))  MRSA PCR Screening     Status: Abnormal   Collection Time: 07/19/19  9:31 PM   Specimen: Nasal Mucosa; Nasopharyngeal  Result Value Ref Range Status   MRSA by PCR POSITIVE (A) NEGATIVE Final    Comment:        The GeneXpert MRSA Assay (FDA approved for NASAL specimens only), is one component of a comprehensive MRSA colonization surveillance program. It is not intended to diagnose MRSA infection nor to guide or monitor treatment for MRSA infections. RESULT CALLED TO, READ BACK BY AND VERIFIED WITH: ROTHERMEL,A @ [id number] LO [id number] BY POTEAT,S Performed at [facility], [place] [address] [place], [place] [address]   Culture, blood (routine x 2)     Status: None (Preliminary result)   Collection Time: 07/24/19  1:07 PM   Specimen: BLOOD  Result Value Ref Range Status   Specimen Description   Final    BLOOD LEFT ARM Performed at [facility] [address] [place], [place] [address]    Special Requests   Final    BOTTLES DRAWN AEROBIC AND ANAEROBIC Blood Culture results may not be optimal due to an inadequate volume of blood received in culture bottles Performed at [facility] [address], [place], [place] [address]    Culture   Final    NO GROWTH 4 DAYS Performed at [facility], [place] [address] [employer], [place] [address]     Report Status PENDING  Incomplete  Culture, blood (routine x 2)     Status: None (Preliminary result)   Collection Time: 07/24/19  1:14 PM   Specimen: BLOOD  Result Value Ref Range Status   Specimen Description   Final    BLOOD LEFT ARM Performed at [facility] [address], [place], [place] [address]    Special Requests   Final    BOTTLES DRAWN AEROBIC AND ANAEROBIC Blood Culture adequate volume Performed at [facility] [address] [place], [place] [address]    Culture   Final    NO GROWTH 4 DAYS Performed at [facility], [place] [address], [place], [place] [address]    Report Status PENDING  Incomplete  Culture, blood (routine x 2)     Status: None (Preliminary result)   Collection Time: 07/27/19  3:36 PM   Specimen: BLOOD  Result Value Ref Range Status   Specimen Description   Final    BLOOD LEFT ANTECUBITAL Performed at [facility] [address], [place], [place] [address]    Special Requests   Final    BOTTLES DRAWN AEROBIC AND ANAEROBIC Blood Culture adequate volume Performed at [facility] [address], [place], [place] [address]    Culture   Final    NO GROWTH < 24 HOURS Performed at [facility] [address] [place], [place] [address]    Report Status PENDING  Incomplete  Culture, blood (routine x 2)     Status: None (Preliminary result)   Collection Time: 07/27/19  3:36 PM   Specimen: BLOOD  Result Value Ref Range Status   Specimen Description   Final    BLOOD LEFT ARM Performed at [facility] [address], [place], [place] [address]    Special Requests   Final    BOTTLES DRAWN AEROBIC AND ANAEROBIC Blood Culture adequate volume Performed at [facility] [address], [place], [place] [address]    Culture   Final    NO GROWTH < 24 HOURS Performed at [facility] [address] [place],  [place] [address]    Report Status PENDING  Incomplete         Radiology Studies: Dg Chest 1 View  Result Date: 07/28/2019 CLINICAL DATA:  Hypoxia; history pulmonary emboli, CHF, type II diabetes mellitus, hypertension EXAM: CHEST  1 VIEW COMPARISON:  Portable exam 0418 hours compared to 07/27/2019 FINDINGS: Rotated to the LEFT. Upper normal size of cardiac silhouette. Atherosclerotic calcification aorta. Diffuse BILATERAL pulmonary infiltrates, probably not significantly changed when accounting for differences in technique. Tiny bibasilar effusions. No pneumothorax. IMPRESSION: Persistent BILATERAL pulmonary infiltrates with tiny bibasilar effusions. Electronically Signed   By: Lavonia Dana M.D.   On: 07/28/2019 07:48   Dg Chest 1 View  Result Date: 07/27/2019 CLINICAL DATA:  84 year old female with shortness of breath. EXAM: CHEST  1 VIEW COMPARISON:  Chest radiograph dated 07/26/2019. FINDINGS: No significant interval change in bilateral interstitial and airspace densities and small bilateral pleural effusions likely representing CHF. Pneumonia is not excluded. Clinical correlation is recommended. Stable cardiomediastinal silhouette. Atherosclerotic calcification of the aorta. No pneumothorax. No acute osseous pathology. IMPRESSION: CHF with no significant interval change since the prior radiograph. Follow-up recommended. Electronically Signed   By: Anner Crete M.D.   On: 07/27/2019 09:05        Scheduled Meds: . amLODipine  10 mg Oral Daily  . atorvastatin  40 mg Oral QHS  . chlorhexidine  15 mL Mouth Rinse BID  . Chlorhexidine Gluconate Cloth  6 each Topical Daily  . feeding supplement (ENSURE ENLIVE)  237 mL Oral TID BM  . feeding supplement (PRO-STAT SUGAR FREE 64)  30 mL Oral BID  . fenofibrate  54 mg Oral Daily  . insulin aspart  0-20 Units Subcutaneous Q4H  . insulin glargine  30 Units Subcutaneous QHS  . levothyroxine  112 mcg Oral QAC breakfast  . mouth rinse  15 mL Mouth  Rinse q12n4p  . methylPREDNISolone (SOLU-MEDROL) injection  60 mg Intravenous Q12H  . multivitamin with minerals  1 tablet Oral Daily  . nystatin  5 mL Oral QID  . pantoprazole  40  mg Oral BID AC  . sertraline  100 mg Oral QPM  . sodium chloride flush  3 mL Intravenous Q12H   Continuous Infusions: . sodium chloride       LOS: 9 days     Georgette Shell, MD [facility]  If 7PM-7AM, please contact night-coverage [email or web address] Password [email or web address] 07/28/2019, 12:11 PM

## 2019-07-28 NOTE — Progress Notes (Signed)
NAME:  [name], MRN:  [id number], DOB:  [birth year], LOS: 9 ADMISSION DATE:  07/19/2019, CONSULTATION DATE:  11/12 REFERRING MD:  Roel Cluck, CHIEF COMPLAINT:  Recurrent respiratory failure   Brief History    84 y/o female was admitted on 11/11 with recurrent hypoxemic/hypercarbic respiratory failure requiring Bipap support in setting of diffuse pulmonary infiltrates of unknown origin  She underwent elective cardioversion for chronic AF on 10/19 and was discharged on 2 L O2.  She was admitted 10/26-11/10 for acute hypoxic respiratory failure with bilateral infiltrates, felt to be pneumonitis of unclear etiology.  Serology negative.  Discharged on 6 L oxygen and prednisone taper  Past Medical History  CKD Stage 3, Anemia of chronic disease,Atrial fibrillation on Elaquis, s/p cardioversion,OSA on CPAP,HFpEF ,HLD,Type II DM HTN,Hypothyroidism,Chronic ITP,PE 2010  Significant Hospital Events   Stopped eliquis 11/13 due to concern with hemoptysis report and ? alv hem on cxr/ ct  Consults:  11/12 >> PCCM  Procedures:   Echo 07/05/19 (p onset of sob/ infiltrates: Grade I diastolic dysfunction with Mild LAE, nl RA  Significant Diagnostic Tests:   10/27 CT Chest >> Widespread ground-glass opacities, small bilat pleural effusions   Micro Data:  11/10 >> Sars-CoV2 neg 11/10 >> MRSA PCR pos 11/11  Urine strep neg 11/11  Urine legionella neg   Note PCT  11/15  Neg    Antimicrobials:  11/11 Vanco  >>11/15 11/11 Cefepime  >>11/15 11/11 Azithromycin >> 11/15  Interim history/subjective:   Still remains on high flow nasal cannula overnight.   Objective   Blood pressure (!) 148/36, pulse 71, temperature (!) 97.5 F (36.4 C), temperature source Axillary, resp. rate (!) 24, height 5' 1" (1.549 m), weight 105.7 kg, SpO2 93 %.    FiO2 (%):  [50 %-55 %] 55 %   Intake/Output Summary (Last 24 hours) at 07/28/2019 0933 Last data filed at 07/28/2019 0600 Gross per 24 hour  Intake  120 ml  Output 700 ml  Net -580 ml  sats 94% on 50% Filed Weights   07/26/19 0500 07/27/19 1100 07/28/19 0444  Weight: 108.9 kg 107.2 kg 105.7 kg    Examination: General appearance: 84 y.o., female, NAD, conversant  Eyes: anicteric sclerae, moist conjunctivae; no lid-lag; PERRLA, tracking appropriately HENT: NCAT; oropharynx, mucous membranes dry Neck: Trachea midline; no JVD Lungs: CTAB, no crackles, no wheeze, poor effort shallow breaths CV: RRR, S1, S2, no MRGs  Abdomen: Soft, non-tender; non-distended, BS present  Extremities: Dependent edema upper and lower extremities Skin: Normal temperature, turgor and texture; no rash Psych: Appropriate affect Neuro: Alert and oriented to person and place  Chest x-ray 07/28/2019: -Persistent bilateral pulmonary infiltrates. The patient's images have been independently reviewed by me.    Assessment & Plan:   Acute on chronic hypoxic respiratory failure Diffuse pulmonary infiltrates of unclear etiology PAF (cardioverted and in NSR since Jun 26 2019) CRI (stage III) Mild systolic (EF 91-50%) and grade I diastolic HF Obesity   OSA H/o ITP ( ROMIPLOSTIM & FERAHEME) Chronic anemia  DM DDNR  Pulmonary problem list:  Recurrent acute on chronic hypoxemic  respiratory failure in setting of diffuse patchy infiltrates.  Drug  - no record of any of the usual suspects (chemo, amiodarone/nitrofurantoin)  Serology negative for Connective tissue dz/ BOOP > esr 120 prior to steroids  Hemorrhage on eliquis > d/c'd 11/13  Moderate pulmonary hypertension on echo Felt as if not good candidate for bronchoscopy by previous colleagues.   PLAN: Continue to wean from  Solu-Medrol, currently dropped to 60 twice daily Agree with remaining off antibiotics. Maintain SPO2 greater than 88%. Currently tolerating 55% at 25 L. Continue BiPAP nightly or with sleep. Agree with ongoing palliative care involvement.  She has continued decline transitions to  comfort appears to be the current discussion.  Garner Nash, DO [facility] 07/28/2019 9:44 AM

## 2019-07-29 ENCOUNTER — Inpatient Hospital Stay (HOSPITAL_COMMUNITY): Payer: Medicare Other

## 2019-07-29 LAB — BASIC METABOLIC PANEL
Anion gap: 10 (ref 5–15)
BUN: 99 mg/dL — ABNORMAL HIGH (ref 8–23)
CO2: 37 mmol/L — ABNORMAL HIGH (ref 22–32)
Calcium: 9.5 mg/dL (ref 8.9–10.3)
Chloride: 105 mmol/L (ref 98–111)
Creatinine, Ser: 2.15 mg/dL — ABNORMAL HIGH (ref 0.44–1.00)
GFR calc Af Amer: 24 mL/min — ABNORMAL LOW (ref >60)
GFR calc non Af Amer: 21 mL/min — ABNORMAL LOW (ref >60)
Glucose, Bld: 284 mg/dL — ABNORMAL HIGH (ref 70–99)
Potassium: 5.1 mmol/L (ref 3.5–5.1)
Sodium: 152 mmol/L — ABNORMAL HIGH (ref 135–145)

## 2019-07-29 LAB — GLUCOSE, CAPILLARY
Glucose-Capillary: 235 mg/dL — ABNORMAL HIGH (ref 70–99)
Glucose-Capillary: 200 mg/dL — ABNORMAL HIGH (ref 70–99)
Glucose-Capillary: 128 mg/dL — ABNORMAL HIGH (ref 70–99)
Glucose-Capillary: 301 mg/dL — ABNORMAL HIGH (ref 70–99)
Glucose-Capillary: 328 mg/dL — ABNORMAL HIGH (ref 70–99)
Glucose-Capillary: 156 mg/dL — ABNORMAL HIGH (ref 70–99)

## 2019-07-29 LAB — CULTURE, BLOOD (ROUTINE X 2)
Culture: NO GROWTH
Culture: NO GROWTH
Special Requests: ADEQUATE

## 2019-07-29 LAB — CBC
HCT: 27.2 % — ABNORMAL LOW (ref 36.0–46.0)
Hemoglobin: 8.1 g/dL — ABNORMAL LOW (ref 12.0–15.0)
MCH: 30.3 pg (ref 26.0–34.0)
MCHC: 29.8 g/dL — ABNORMAL LOW (ref 30.0–36.0)
MCV: 101.9 fL — ABNORMAL HIGH (ref 80.0–100.0)
Platelets: 111 10*3/uL — ABNORMAL LOW (ref 150–400)
RBC: 2.67 MIL/uL — ABNORMAL LOW (ref 3.87–5.11)
RDW: 22.1 % — ABNORMAL HIGH (ref 11.5–15.5)
WBC: 8.9 10*3/uL (ref 4.0–10.5)
nRBC: 0 % (ref 0.0–0.2)

## 2019-07-29 LAB — OCCULT BLOOD X 1 CARD TO LAB, STOOL: Fecal Occult Bld: POSITIVE — AB

## 2019-07-29 MED ORDER — BISACODYL 10 MG RE SUPP
10.0000 mg | Freq: Once | RECTAL | Status: AC
  Administered 2019-07-29: 10 mg via RECTAL
  Filled 2019-07-29: qty 1

## 2019-07-29 MED ORDER — DOCUSATE SODIUM 100 MG PO CAPS
200.0000 mg | ORAL_CAPSULE | Freq: Every day | ORAL | Status: DC
  Administered 2019-07-29 – 2019-08-06 (×4): 200 mg via ORAL
  Filled 2019-07-29 (×6): qty 2

## 2019-07-29 MED ORDER — INSULIN GLARGINE 100 UNIT/ML ~~LOC~~ SOLN
30.0000 [IU] | Freq: Every day | SUBCUTANEOUS | Status: DC
  Administered 2019-07-29 – 2019-08-04 (×7): 30 [IU] via SUBCUTANEOUS
  Filled 2019-07-29 (×8): qty 0.3

## 2019-07-29 NOTE — Progress Notes (Signed)
NAME:  [name], MRN:  [id number], DOB:  Aug 13, 1935, LOS: 22 ADMISSION DATE:  07/19/2019, CONSULTATION DATE:  11/12 REFERRING MD:  Roel Cluck, CHIEF COMPLAINT:  Recurrent respiratory failure   Brief History    84 y/o female was admitted on 11/11 with recurrent hypoxemic/hypercarbic respiratory failure requiring Bipap support in setting of diffuse pulmonary infiltrates of unknown origin  She underwent elective cardioversion for chronic AF on 10/19 and was discharged on 2 L O2.  She was admitted 10/26-11/10 for acute hypoxic respiratory failure with bilateral infiltrates, felt to be pneumonitis of unclear etiology.  Serology negative.  Discharged on 6 L oxygen and prednisone taper  Past Medical History  CKD Stage 3, Anemia of chronic disease,Atrial fibrillation on Elaquis, s/p cardioversion,OSA on CPAP,HFpEF ,HLD,Type II DM HTN,Hypothyroidism,Chronic ITP,PE 2010  Significant Hospital Events   Stopped eliquis 11/13 due to concern with hemoptysis report and ? alv hem on cxr/ ct  Consults:  11/12 >> PCCM  Procedures:   Echo 07/05/19 (p onset of sob/ infiltrates: Grade I diastolic dysfunction with Mild LAE, nl RA  Significant Diagnostic Tests:   10/27 CT Chest >> Widespread ground-glass opacities, small bilat pleural effusions   Micro Data:  11/10 >> Sars-CoV2 neg 11/10 >> MRSA PCR pos 11/11  Urine strep neg 11/11  Urine legionella neg   Note PCT  11/15  Neg    Antimicrobials:  11/11 Vanco  >>11/15 11/11 Cefepime  >>11/15 11/11 Azithromycin >> 11/15  Interim history/subjective:  Remains on high flow nasal cannula in the daytime BiPAP overnight Down to 55% / 25 L , saturation 93 to 95% range   Objective   Blood pressure (!) 123/34, pulse 65, temperature 97.6 F (36.4 C), temperature source Oral, resp. rate (!) 22, height _0  (1.549 m), weight 103.9 kg, SpO2 91 %.    FiO2 (%):  [50 %-55 %] 50 %   Intake/Output Summary (Last 24 hours) at 07/29/2019 1039 Last data  filed at 07/28/2019 1800 Gross per 24 hour  Intake 360 ml  Output -  Net 360 ml  sats 94% on 50% Filed Weights   07/27/19 1100 07/28/19 0444 07/29/19 0500  Weight: 107.2 kg 105.7 kg 103.9 kg    Examination: General appearance: Elderly woman, no distress, pleasant conversant  Eyes: No pallor or icterus HENT: NCAT; oropharynx, mucous membranes dry Neck: Trachea midline; no JVD Lungs: Creased breath sounds bilateral, no crackles, no wheeze, poor effort shallow breaths CV: RRR, S1, S2, no MRGs  Abdomen: Soft, non-tender; non-distended, BS present  Extremities: Dependent edema upper and lower extremities Skin: No rash Psych: Appropriate affect Neuro: Alert and interactive, no asterixis  Chest x-ray 11/20 personally reviewed which shows persistent bilateral infiltrates, unchanged from prior  Assessment & Plan:   Acute on chronic hypoxic respiratory failure Diffuse pulmonary infiltrates of unclear etiology PAF (cardioverted and in NSR since Jun 26 2019) CRI (stage III) Mild systolic (EF 27-03%) and grade I diastolic HF Obesity   OSA H/o ITP ( ROMIPLOSTIM & FERAHEME) Chronic anemia  DM DDNR  Pulmonary problem list:  Recurrent acute on chronic hypoxemic  respiratory failure in setting of diffuse patchy infiltrates.  Drug  - no record of any of the usual suspects (chemo, amiodarone/nitrofurantoin)  Serology negative for Connective tissue dz/ BOOP > esr 120 prior to steroids  Hemorrhage on eliquis > d/c'd 11/13  Moderate pulmonary hypertension on echo Not a  good candidate for bronchoscopy    PLAN: Continue to drop FiO2,Maintain SPO2 greater than 88%.  Currently tolerating 50% at 25 L. Continue BiPAP nightly or with sleep. Continue Solu-Medrol 60 every 12 will drop to 40 tomorrow Remains off antibiotics  Patient seems very comfortable on current oxygen levels and likes the care she is receiving.  Daughters are a little frustrated at lack of progress.  I can understand this  since we do not have a clear diagnosis/prognosis.  Clearly care limitations have been set-if she worsens can progress to comfort care but she may very well slowly improve over time.  Not sure we have any more interventions other than steroids, oxygen and time   Kara Mead MD. FCCP. [facility]  If no response to pager , please call [phone]     07/29/2019 10:39 AM

## 2019-07-29 NOTE — Progress Notes (Signed)
PROGRESS NOTE    [name]  GNF:[id number] DOB: [birth year] DOA: 07/19/2019 PCP: Javier Glazier, MD   Brief Narrative: 84 y.o.femalewith medical history significant of chronic ITP, CKD stage3, anemia of chronic disease, A.fib on Eliquis,sleep apnea on CPAP history of CHF HLD history of pulmonary embolism in 2010 diabetes type 2, HTN,hypothyroidism, pressure ulcer Presented withworsening shortness of breathfromSNF. Multiple recent admissions in October, most recentlyshe was discharged to SNF 1 day ago from [facility] where she was treated for acute respiratory failure and thought to be due to pneumonitis and was sent home with tapering dose of steroid. She was having increased oxygen requirement, initially managed with nonrebreather. BiPAP was startedlaterdue to worsening respiratory status. Chest x-ray with worsening of bilateral infiltrate, broader differential which includes pneumonitis, bilateral pneumonia, ARDS, pulmonary vascular congestion. She was started on broad-spectrum antibiotics,Lasix and steroids. Antibiotics were discontinued after having normal procalcitonin multiple times. Continue steroid and give Lasix as needed. Currently appears dry. PCCM is on board and does not think that there is much to offer for her.   Assessment & Plan:   Principal Problem:   Acute respiratory failure with hypoxia (HCC) Active Problems:   Hypothyroidism   HYPERCHOLESTEROLEMIA   OBESITY   Essential hypertension   Chronic kidney disease (CKD) stage G4/A1, severely decreased glomerular filtration rate (GFR) between 15-29 mL/min/1.73 square meter and albuminuria creatinine ratio less than 30 mg/g (HCC)   Chronic kidney disease   Chronic ITP (idiopathic thrombocytopenia) (HCC)   Normocytic anemia   Atypical atrial flutter (HCC)   HCAP (healthcare-associated pneumonia)   Pneumonitis   Shortness of breath   Palliative care by specialist   Goals of care,  counseling/discussion   #1 acute on chronic hypoxic and hypercapnic respiratory failure of unclear etiology-prior to admission patient was not on oxygen.she is still on high flow nasal cannula satting 91% on 25 L.  IV steroids decreased to 60 mg twice a day yesterday. Palliative care and PCCM following appreciate their input. Discussed with both daughters.  #2 hypothermia resolved  #3 hypertension continue amlodipine and hydralazine as needed.  Blood pressure 131/37.  #4 AKI on CKD stage III -creatinine elevated but stable.   #5 diastolic heart failure with mild systolic heart failure -no diuresis due to hypernatremia  #6 chronic ITP platelet count is 111 down from 150 on 07/26/2019.  Monitor closely.  #7 type 2 diabetesblood sugar improved.  Blood sugar 1 28-2 35-3 01.  Continue Lantus will increase the dose to 30 units at night.     #8 atrial fibrillation chronic status post cardioversion 06/26/2019 now in sinus rhythm. Eliquis discontinued due to recent hemoptysis 07/21/2019.  #9 hypothyroidism continue Synthroid.  #10 obstructive sleep apnea on BiPAP  #11 hypernatremia improved monitor closely sodium 152  She is awake alert oriented.  Mental status stable.encourage water intake.  #12 depression continue Zoloft  #13 rule out UTI patient reports burning urination and having chills and shaking and rigors.  Urine culture unremarkable.   Pressure Injury 07/08/19 Buttocks Right;Left;Medial;Lower Stage II -  Partial thickness loss of dermis presenting as a shallow open ulcer with a red, pink wound bed without slough. Red, nonblanchable, (Active)  07/08/19 1000  Location: Buttocks  Location Orientation: Right;Left;Medial;Lower  Staging: Stage II -  Partial thickness loss of dermis presenting as a shallow open ulcer with a red, pink wound bed without slough.  Wound Description (Comments): Red, nonblanchable,  Present on Admission: Yes      Nutrition Problem:  Increased nutrient needs Etiology: chronic illness(CHF)     Signs/Symptoms: estimated needs    Interventions: Glucerna shake, Prostat, MVI  Estimated body mass index is 43.28 kg/m as calculated from the following:   Height as of this encounter: 5\' 1"  (1.549 m).   Weight as of this encounter: 103.9 kg.   Subjective: Patient is resting in bed awake alert eating sugar-free popsicle feels her breathing is better  Objective: Vitals:   07/29/19 0800 07/29/19 0811 07/29/19 0821 07/29/19 1000  BP: (!) 123/34   (!) 131/37  Pulse: 60  65 66  Resp: 19  (!) 22 20  Temp:  97.6 F (36.4 C)    TempSrc:  Oral    SpO2: 92%  91% 91%  Weight:      Height:        Intake/Output Summary (Last 24 hours) at 07/29/2019 1237 Last data filed at 07/29/2019 1100 Gross per 24 hour  Intake 520 ml  Output -  Net 520 ml   Filed Weights   07/27/19 1100 07/28/19 0444 07/29/19 0500  Weight: 107.2 kg 105.7 kg 103.9 kg    Examination:  General exam: Appears calm and comfortable  Respiratory system: Diminished breath sounds at the bases to auscultation. Respiratory effort normal. Cardiovascular system: S1 & S2 heard, RRR. No JVD, murmurs, rubs, gallops or clicks. No pedal edema. Gastrointestinal system: Abdomen is nondistended, soft and nontender. No organomegaly or masses felt. Normal bowel sounds heard. Central nervous system: Alert and oriented. No focal neurological deficits. Extremities: 2+ pitting edema Skin: No rashes, lesions or ulcers Psychiatry: Judgement and insight appear normal. Mood & affect appropriate.     Data Reviewed: I have personally reviewed following labs and imaging studies  CBC: Recent Labs  Lab 07/23/19 0242 07/24/19 0237 07/25/19 0214 07/26/19 0151 07/29/19 0153  WBC 15.2* 10.0 10.3 8.7 8.9  HGB 8.8* 8.2* 8.1* 8.0* 8.1*  HCT 30.9* 28.6* 28.2* 28.3* 27.2*  MCV 102.3* 102.9* 101.8* 103.3* 101.9*  PLT 203 146* 152 150 542*   Basic Metabolic Panel: Recent  Labs  Lab 07/23/19 0242  07/24/19 0237 07/25/19 0214 07/26/19 0151 07/27/19 0749 07/27/19 1816 07/28/19 0736 07/29/19 0153  NA 152*   < > 149* 149* 149* 152* 152* 157* 152*  K 4.4   < > 4.5 5.0 4.8 4.9 4.9 5.3* 5.1  CL 105   < > 103 107 107 107 104 109 105  CO2 38*   < > 38* 36* 35* 33* 38* 37* 37*  GLUCOSE 203*   < > 196* 142* 193* 181* 461* 89 284*  BUN 96*   < > 98* 91* 92* 95* 99* 99* 99*  CREATININE 1.96*  1.81*   < > 2.03* 1.94* 2.02* 2.02* 2.10* 2.16* 2.15*  CALCIUM 9.8   < > 9.1 9.4 9.3 9.8 9.7 10.2 9.5  PHOS 4.6  --  4.8* 4.2 4.6  --   --   --   --    < > = values in this interval not displayed.   GFR: Estimated Creatinine Clearance: 22.7 mL/min (A) (by C-G formula based on SCr of 2.15 mg/dL (H)). Liver Function Tests: Recent Labs  Lab 07/23/19 0242 07/24/19 0237 07/25/19 0214 07/26/19 0151  ALBUMIN 3.5 3.2* 3.1* 3.1*   No results for input(s): LIPASE, AMYLASE in the last 168 hours. No results for input(s): AMMONIA in the last 168 hours. Coagulation Profile: No results for input(s): INR, PROTIME in the last 168 hours. Cardiac Enzymes: No results for  input(s): CKTOTAL, CKMB, CKMBINDEX, TROPONINI in the last 168 hours. BNP (last 3 results) No results for input(s): PROBNP in the last 8760 hours. HbA1C: No results for input(s): HGBA1C in the last 72 hours. CBG: Recent Labs  Lab 07/28/19 1952 07/28/19 2350 07/29/19 0406 07/29/19 0749 07/29/19 1226  GLUCAP 182* 235* 200* 128* 301*   Lipid Profile: No results for input(s): CHOL, HDL, LDLCALC, TRIG, CHOLHDL, LDLDIRECT in the last 72 hours. Thyroid Function Tests: No results for input(s): TSH, T4TOTAL, FREET4, T3FREE, THYROIDAB in the last 72 hours. Anemia Panel: No results for input(s): VITAMINB12, FOLATE, FERRITIN, TIBC, IRON, RETICCTPCT in the last 72 hours. Sepsis Labs: Recent Labs  Lab 07/22/19 1446 07/23/19 0242 07/24/19 0237  PROCALCITON <0.10 <0.10 0.16    Recent Results (from the past 240  hour(s))  MRSA PCR Screening     Status: Abnormal   Collection Time: 07/19/19  9:31 PM   Specimen: Nasal Mucosa; Nasopharyngeal  Result Value Ref Range Status   MRSA by PCR POSITIVE (A) NEGATIVE Final    Comment:        The GeneXpert MRSA Assay (FDA approved for NASAL specimens only), is one component of a comprehensive MRSA colonization surveillance program. It is not intended to diagnose MRSA infection nor to guide or monitor treatment for MRSA infections. RESULT CALLED TO, READ BACK BY AND VERIFIED WITH: ROTHERMEL,A @ 1941 DE [id number] BY POTEAT,S Performed at [facility], [place] [address], [place], [place] [address]   Culture, blood (routine x 2)     Status: None   Collection Time: 07/24/19  1:07 PM   Specimen: BLOOD  Result Value Ref Range Status   Specimen Description   Final    BLOOD LEFT ARM Performed at [facility] [address] [place], [place] [address]    Special Requests   Final    BOTTLES DRAWN AEROBIC AND ANAEROBIC Blood Culture results may not be optimal due to an inadequate volume of blood received in culture bottles Performed at [facility] [address], [place], [place] [address]    Culture   Final    NO GROWTH 5 DAYS Performed at [facility], [place] [address] [place], [place] [address]    Report Status 07/29/2019 FINAL  Final  Culture, blood (routine x 2)     Status: None   Collection Time: 07/24/19  1:14 PM   Specimen: BLOOD  Result Value Ref Range Status   Specimen Description   Final    BLOOD LEFT ARM Performed at [facility] [address], [place], [place] [address]    Special Requests   Final    BOTTLES DRAWN AEROBIC AND ANAEROBIC Blood Culture adequate volume Performed at [facility] [address] [place], [place] [address]    Culture   Final    NO GROWTH 5 DAYS Performed at [facility], [place] [address], [place], [place]  [address]    Report Status 07/29/2019 FINAL  Final  Culture, Urine     Status: Abnormal   Collection Time: 07/27/19  3:19 PM   Specimen: Urine, Random  Result Value Ref Range Status   Specimen Description   Final    URINE, RANDOM Performed at [facility] [address] [place],  [address]    Special Requests   Final    NONE Performed at [facility], [place] [address] [place],  [address]  Culture (A)  Final    <10,000 COLONIES/mL INSIGNIFICANT GROWTH Performed at [facility] [address], [place], [place] [address]    Report Status 07/28/2019 FINAL  Final  Culture, blood (routine x 2)     Status: None (Preliminary result)   Collection Time: 07/27/19  3:36 PM   Specimen: BLOOD  Result Value Ref Range Status   Specimen Description   Final    BLOOD LEFT ANTECUBITAL Performed at [facility] [address] [place], [place] [address]    Special Requests   Final    BOTTLES DRAWN AEROBIC AND ANAEROBIC Blood Culture adequate volume Performed at [facility] [address], [place], [place] [address]    Culture   Final    NO GROWTH 2 DAYS Performed at [facility] [address], [place], [place] [address]    Report Status PENDING  Incomplete  Culture, blood (routine x 2)     Status: None (Preliminary result)   Collection Time: 07/27/19  3:36 PM   Specimen: BLOOD  Result Value Ref Range Status   Specimen Description   Final    BLOOD LEFT ARM Performed at [facility] [address], [place], [place] [address]    Special Requests   Final    BOTTLES DRAWN AEROBIC AND ANAEROBIC Blood Culture adequate volume Performed at [facility] [address], [place], [place] [address]    Culture   Final    NO GROWTH 2 DAYS Performed at [facility] [address], [place],  [address]    Report Status PENDING  Incomplete          Radiology Studies: Dg Chest 1 View  Result Date: 07/28/2019 CLINICAL DATA:  Hypoxia; history pulmonary emboli, CHF, type II diabetes mellitus, hypertension EXAM: CHEST  1 VIEW COMPARISON:  Portable exam 0418 hours compared to 07/27/2019 FINDINGS: Rotated to the LEFT. Upper normal size of cardiac silhouette. Atherosclerotic calcification aorta. Diffuse BILATERAL pulmonary infiltrates, probably not significantly changed when accounting for differences in technique. Tiny bibasilar effusions. No pneumothorax. IMPRESSION: Persistent BILATERAL pulmonary infiltrates with tiny bibasilar effusions. Electronically Signed   By: Lavonia Dana M.D.   On: 07/28/2019 07:48   US Renal  Result Date: 07/29/2019 CLINICAL DATA:  Elevated creatinine. EXAM: RENAL / URINARY TRACT ULTRASOUND COMPLETE COMPARISON:  06/15/2017 CT FINDINGS: Right Kidney: Renal measurements: 9.3 x 5.1 x 5.9 cm = volume: 146 mL. Renal cortical thinning with increased renal echogenicity. Left Kidney: Renal measurements: 11.0 x 5.0 x 4.7 cm = volume: 134 mL. Renal cortical thinning with increased echogenicity. Suboptimally imaged secondary to patient's size and immobility. Bladder: Appears normal for degree of bladder distention. Other: Gallbladder sludge incidentally noted. Prominent spleen, suboptimally evaluated. IMPRESSION: 1.  No hydronephrosis. 2. Renal cortical thinning with increased echogenicity, suggesting medical renal disease. 3. Gallbladder sludge. 4. Probable splenomegaly. Electronically Signed   By: Abigail Miyamoto M.D.   On: 07/29/2019 08:38        Scheduled Meds: . amLODipine  10 mg Oral Daily  . atorvastatin  40 mg Oral QHS  . chlorhexidine  15 mL Mouth Rinse BID  . Chlorhexidine Gluconate Cloth  6 each Topical Daily  . feeding supplement (ENSURE ENLIVE)  237 mL Oral TID BM  . feeding supplement (PRO-STAT SUGAR FREE 64)  30 mL Oral BID  . fenofibrate  54 mg Oral Daily  . insulin aspart  0-20 Units Subcutaneous Q4H  .  insulin glargine  25 Units Subcutaneous QHS  . levothyroxine  112 mcg Oral QAC breakfast  . mouth rinse  15 mL Mouth Rinse q12n4p  . methylPREDNISolone (SOLU-MEDROL) injection  60 mg Intravenous Q12H  . multivitamin with minerals  1 tablet Oral Daily  . nystatin  5 mL Oral QID  . pantoprazole  40 mg Oral BID AC  . sertraline  100 mg Oral QPM  . sodium chloride flush  3 mL Intravenous Q12H   Continuous Infusions: . sodium chloride       LOS: 10 days    Georgette Shell, MD [facility]  If 7PM-7AM, please contact night-coverage [email or web address] Password [facility] 07/29/2019, 12:37 PM

## 2019-07-30 LAB — GLUCOSE, CAPILLARY
Glucose-Capillary: 132 mg/dL — ABNORMAL HIGH (ref 70–99)
Glucose-Capillary: 143 mg/dL — ABNORMAL HIGH (ref 70–99)
Glucose-Capillary: 169 mg/dL — ABNORMAL HIGH (ref 70–99)
Glucose-Capillary: 171 mg/dL — ABNORMAL HIGH (ref 70–99)
Glucose-Capillary: 183 mg/dL — ABNORMAL HIGH (ref 70–99)
Glucose-Capillary: 200 mg/dL — ABNORMAL HIGH (ref 70–99)
Glucose-Capillary: 94 mg/dL (ref 70–99)

## 2019-07-30 LAB — BASIC METABOLIC PANEL
Anion gap: 10 (ref 5–15)
BUN: 97 mg/dL — ABNORMAL HIGH (ref 8–23)
CO2: 39 mmol/L — ABNORMAL HIGH (ref 22–32)
Calcium: 9.7 mg/dL (ref 8.9–10.3)
Chloride: 105 mmol/L (ref 98–111)
Creatinine, Ser: 2.16 mg/dL — ABNORMAL HIGH (ref 0.44–1.00)
GFR calc Af Amer: 24 mL/min — ABNORMAL LOW (ref >60)
GFR calc non Af Amer: 21 mL/min — ABNORMAL LOW (ref >60)
Glucose, Bld: 88 mg/dL (ref 70–99)
Potassium: 3.9 mmol/L (ref 3.5–5.1)
Sodium: 154 mmol/L — ABNORMAL HIGH (ref 135–145)

## 2019-07-30 LAB — CBC
HCT: 29.3 % — ABNORMAL LOW (ref 36.0–46.0)
Hemoglobin: 8.4 g/dL — ABNORMAL LOW (ref 12.0–15.0)
MCH: 29.8 pg (ref 26.0–34.0)
MCHC: 28.7 g/dL — ABNORMAL LOW (ref 30.0–36.0)
MCV: 103.9 fL — ABNORMAL HIGH (ref 80.0–100.0)
Platelets: 112 10*3/uL — ABNORMAL LOW (ref 150–400)
RBC: 2.82 MIL/uL — ABNORMAL LOW (ref 3.87–5.11)
RDW: 22.3 % — ABNORMAL HIGH (ref 11.5–15.5)
WBC: 9.8 10*3/uL (ref 4.0–10.5)
nRBC: 0 % (ref 0.0–0.2)

## 2019-07-30 MED ORDER — MORPHINE SULFATE (PF) 2 MG/ML IV SOLN
1.0000 mg | INTRAVENOUS | Status: DC | PRN
  Administered 2019-07-30 – 2019-08-04 (×12): 1 mg via INTRAVENOUS
  Filled 2019-07-30 (×13): qty 1

## 2019-07-30 MED ORDER — NYSTATIN 100000 UNIT/GM EX CREA
TOPICAL_CREAM | Freq: Two times a day (BID) | CUTANEOUS | Status: DC
  Administered 2019-07-30 – 2019-08-05 (×14): via TOPICAL
  Administered 2019-08-06: 1 via TOPICAL
  Administered 2019-08-06 – 2019-08-07 (×2): via TOPICAL
  Filled 2019-07-30 (×2): qty 15

## 2019-07-30 MED ORDER — OXYCODONE HCL 5 MG PO TABS
5.0000 mg | ORAL_TABLET | ORAL | Status: DC | PRN
  Administered 2019-07-30 – 2019-08-03 (×3): 5 mg via ORAL
  Filled 2019-07-30 (×3): qty 1

## 2019-07-30 MED ORDER — METHYLPREDNISOLONE SODIUM SUCC 40 MG IJ SOLR
40.0000 mg | Freq: Two times a day (BID) | INTRAMUSCULAR | Status: DC
  Administered 2019-07-30 – 2019-07-31 (×2): 40 mg via INTRAVENOUS
  Filled 2019-07-30 (×2): qty 1

## 2019-07-30 NOTE — Progress Notes (Signed)
PROGRESS NOTE    [name]  CXK:[id number] DOB: [birth year] DOA: 07/19/2019 PCP: Javier Glazier, MD   Brief Narrative: 84 y.o.femalewith medical history significant of chronic ITP, CKD stage3, anemia of chronic disease, A.fib on Eliquis,sleep apnea on CPAP history of CHF HLD history of pulmonary embolism in 2010 diabetes type 2, HTN,hypothyroidism, pressure ulcer Presented withworsening shortness of breathfromSNF. Multiple recent admissions in October, most recentlyshe was discharged to SNF 1 day ago from [facility] where she was treated for acute respiratory failure and thought to be due to pneumonitis and was sent home with tapering dose of steroid. She was having increased oxygen requirement, initially managed with nonrebreather. BiPAP was startedlaterdue to worsening respiratory status. Chest x-ray with worsening of bilateral infiltrate, broader differential which includes pneumonitis, bilateral pneumonia, ARDS, pulmonary vascular congestion. She was started on broad-spectrum antibiotics,Lasix and steroids. Antibiotics were discontinued after having normal procalcitonin multiple times. Continue steroid and give Lasix as needed. Currently appears dry. PCCM is on board and does not think that there is much to offer for her.  Assessment & Plan:   Principal Problem:   Acute respiratory failure with hypoxia (HCC) Active Problems:   Hypothyroidism   HYPERCHOLESTEROLEMIA   OBESITY   Essential hypertension   Chronic kidney disease (CKD) stage G4/A1, severely decreased glomerular filtration rate (GFR) between 15-29 mL/min/1.73 square meter and albuminuria creatinine ratio less than 30 mg/g (HCC)   Chronic kidney disease   Chronic ITP (idiopathic thrombocytopenia) (HCC)   Normocytic anemia   Atypical atrial flutter (HCC)   HCAP (healthcare-associated pneumonia)   Pneumonitis   Shortness of breath   Palliative care by specialist   Goals of care,  counseling/discussion   #1 acute on chronic hypoxic and hypercapnic respiratory failure of unclear etiology-prior to admission patient was not on oxygen.she is still on high flow nasal cannula satting 91% on 30 L.  IV steroids decreased to  40 mg twice a day today. Palliative care and PCCM following appreciate their input.   #2 hypothermia resolved  #3 hypertension continue amlodipine and hydralazine as needed. Blood pressure 146/41.  #4 AKI on CKD stage III -creatinine elevated but stable.   #5 diastolic heart failure with mild systolic heart failure-not on  diuretics  #6 chronic ITP platelet count is 112 down from 150 on 07/26/2019.  Monitor closely.  #7 type 2 diabetesbloodsugar improved.  Blood sugar 94 to 200  Continue Lantus  30 units at night.    #8 atrial fibrillation chronic status post cardioversion 06/26/2019 now in sinus rhythm. Eliquis discontinued due to recent hemoptysis 07/21/2019.  #9 hypothyroidism continue Synthroid.  #10 obstructive sleep apnea on BiPAP  #11 hypernatremia  monitor closelysodium 154She is awake alert oriented. Mental status stable.encourage water intake.  #12 depression continue Zoloft    Pressure Injury 07/08/19 Buttocks Right;Left;Medial;Lower Stage II -  Partial thickness loss of dermis presenting as a shallow open ulcer with a red, pink wound bed without slough. Red, nonblanchable, (Active)  07/08/19 1000  Location: Buttocks  Location Orientation: Right;Left;Medial;Lower  Staging: Stage II -  Partial thickness loss of dermis presenting as a shallow open ulcer with a red, pink wound bed without slough.  Wound Description (Comments): Red, nonblanchable,  Present on Admission: Yes      Nutrition Problem: Increased nutrient needs Etiology: chronic illness(CHF)     Signs/Symptoms: estimated needs    Interventions: Glucerna shake, Prostat, MVI  Estimated body mass index is 44.11 kg/m as calculated from the  following:  Height as of this encounter: 5\' 1"  (1.549 m).   Weight as of this encounter: 105.9 kg.  DVT prophylaxis:SCDs due to thrombocytopenia  code Status:DO NOT RESUSCITATE  family Communication:Discussed with both daughters face-to-face in the patient's room  disposition Plan:Pending clinical improvement  Consultants:  PCCM oncology and palliative care  Procedures:None Antimicrobials:None  Subjective: Complains of pain where pure wick used to be.  Urine culture was negative.  She has a lot of excoriation in the perineal area due to constant moisture.  She was given stool softeners yesterday as she has not had a BM for a long time.  She started having BMs after the stool softeners were given. Objective: Vitals:   07/30/19 0800 07/30/19 0812 07/30/19 1045 07/30/19 1200  BP: 108/70   (!) 146/41  Pulse: 68 75  69  Resp: (!) 22 (!) 21  19  Temp:   (!) 97.5 F (36.4 C)   TempSrc:   Oral   SpO2: 92% 92%  91%  Weight:      Height:        Intake/Output Summary (Last 24 hours) at 07/30/2019 1229 Last data filed at 07/29/2019 1800 Gross per 24 hour  Intake 300 ml  Output -  Net 300 ml   Filed Weights   07/28/19 0444 07/29/19 0500 07/30/19 0500  Weight: 105.7 kg 103.9 kg 105.9 kg    Examination:  General exam: Appears calm and comfortable  Respiratory system: Diminished breath sounds at the bases to auscultation. Respiratory effort normal. Cardiovascular system: S1 & S2 heard, RRR. No JVD, murmurs, rubs, gallops or clicks. No pedal edema. Gastrointestinal system: Abdomen is nondistended, soft and nontender. No organomegaly or masses felt. Normal bowel sounds heard. Central nervous system: Alert and oriented. No focal neurological deficits. Extremities: 2+ pitting edema Skin: No rashes, lesions or ulcers Psychiatry: Judgement and insight appear normal. Mood & affect appropriate.     Data Reviewed: I have personally reviewed following labs and imaging  studies  CBC: Recent Labs  Lab 07/24/19 0237 07/25/19 0214 07/26/19 0151 07/29/19 0153 07/30/19 0719  WBC 10.0 10.3 8.7 8.9 9.8  HGB 8.2* 8.1* 8.0* 8.1* 8.4*  HCT 28.6* 28.2* 28.3* 27.2* 29.3*  MCV 102.9* 101.8* 103.3* 101.9* 103.9*  PLT 146* 152 150 111* 086*   Basic Metabolic Panel: Recent Labs  Lab 07/24/19 0237 07/25/19 0214 07/26/19 0151 07/27/19 0749 07/27/19 1816 07/28/19 0736 07/29/19 0153 07/30/19 0719  NA 149* 149* 149* 152* 152* 157* 152* 154*  K 4.5 5.0 4.8 4.9 4.9 5.3* 5.1 3.9  CL 103 107 107 107 104 109 105 105  CO2 38* 36* 35* 33* 38* 37* 37* 39*  GLUCOSE 196* 142* 193* 181* 461* 89 284* 88  BUN 98* 91* 92* 95* 99* 99* 99* 97*  CREATININE 2.03* 1.94* 2.02* 2.02* 2.10* 2.16* 2.15* 2.16*  CALCIUM 9.1 9.4 9.3 9.8 9.7 10.2 9.5 9.7  PHOS 4.8* 4.2 4.6  --   --   --   --   --    GFR: Estimated Creatinine Clearance: 22.9 mL/min (A) (by C-G formula based on SCr of 2.16 mg/dL (H)). Liver Function Tests: Recent Labs  Lab 07/24/19 0237 07/25/19 0214 07/26/19 0151  ALBUMIN 3.2* 3.1* 3.1*   No results for input(s): LIPASE, AMYLASE in the last 168 hours. No results for input(s): AMMONIA in the last 168 hours. Coagulation Profile: No results for input(s): INR, PROTIME in the last 168 hours. Cardiac Enzymes: No results for input(s): CKTOTAL, CKMB, CKMBINDEX, TROPONINI in  the last 168 hours. BNP (last 3 results) No results for input(s): PROBNP in the last 8760 hours. HbA1C: No results for input(s): HGBA1C in the last 72 hours. CBG: Recent Labs  Lab 07/29/19 2038 07/30/19 0048 07/30/19 0440 07/30/19 0827 07/30/19 1156  GLUCAP 156* 171* 143* 94 200*   Lipid Profile: No results for input(s): CHOL, HDL, LDLCALC, TRIG, CHOLHDL, LDLDIRECT in the last 72 hours. Thyroid Function Tests: No results for input(s): TSH, T4TOTAL, FREET4, T3FREE, THYROIDAB in the last 72 hours. Anemia Panel: No results for input(s): VITAMINB12, FOLATE, FERRITIN, TIBC, IRON,  RETICCTPCT in the last 72 hours. Sepsis Labs: Recent Labs  Lab 07/24/19 0237  PROCALCITON 0.16    Recent Results (from the past 240 hour(s))  Culture, blood (routine x 2)     Status: None   Collection Time: 07/24/19  1:07 PM   Specimen: BLOOD  Result Value Ref Range Status   Specimen Description   Final    BLOOD LEFT ARM Performed at [facility] [address] [place], [place] [address]    Special Requests   Final    BOTTLES DRAWN AEROBIC AND ANAEROBIC Blood Culture results may not be optimal due to an inadequate volume of blood received in culture bottles Performed at [facility] [address], [place], [place] [address]    Culture   Final    NO GROWTH 5 DAYS Performed at [facility], [place] [address], [place], [place] [address]    Report Status 07/29/2019 FINAL  Final  Culture, blood (routine x 2)     Status: None   Collection Time: 07/24/19  1:14 PM   Specimen: BLOOD  Result Value Ref Range Status   Specimen Description   Final    BLOOD LEFT ARM Performed at [facility] [address], [place], [place] [address]    Special Requests   Final    BOTTLES DRAWN AEROBIC AND ANAEROBIC Blood Culture adequate volume Performed at [facility] [address], [place], [place] [address]    Culture   Final    NO GROWTH 5 DAYS Performed at [facility], [place] [address] [place], [place] [address]    Report Status 07/29/2019 FINAL  Final  Culture, Urine     Status: Abnormal   Collection Time: 07/27/19  3:19 PM   Specimen: Urine, Random  Result Value Ref Range Status   Specimen Description   Final    URINE, RANDOM Performed at [facility] [address], [place], [place] [address]    Special Requests   Final    NONE Performed at [facility], [place] [address], [place], [place] [address]    Culture (A)  Final    <10,000 COLONIES/mL  INSIGNIFICANT GROWTH Performed at [facility] [address] [place], [place] [address]    Report Status 07/28/2019 FINAL  Final  Culture, blood (routine x 2)     Status: None (Preliminary result)   Collection Time: 07/27/19  3:36 PM   Specimen: BLOOD  Result Value Ref Range Status   Specimen Description   Final    BLOOD LEFT ANTECUBITAL Performed at [facility] [address], [place], [place] [address]    Special Requests   Final    BOTTLES DRAWN AEROBIC AND ANAEROBIC Blood Culture adequate volume Performed at [facility] [address] [place],  [address]    Culture  Final    NO GROWTH 3 DAYS Performed at [facility], [place] [address], [place], [place] [address]    Report Status PENDING  Incomplete  Culture, blood (routine x 2)     Status: None (Preliminary result)   Collection Time: 07/27/19  3:36 PM   Specimen: BLOOD  Result Value Ref Range Status   Specimen Description   Final    BLOOD LEFT ARM Performed at [facility] [address], [place], [place] [address]    Special Requests   Final    BOTTLES DRAWN AEROBIC AND ANAEROBIC Blood Culture adequate volume Performed at [facility] [address] [place], [place] [address]    Culture   Final    NO GROWTH 3 DAYS Performed at [facility], [place] [address], [place],  [address]    Report Status PENDING  Incomplete         Radiology Studies: US Renal  Result Date: 07/29/2019 CLINICAL DATA:  Elevated creatinine. EXAM: RENAL / URINARY TRACT ULTRASOUND COMPLETE COMPARISON:  06/15/2017 CT FINDINGS: Right Kidney: Renal measurements: 9.3 x 5.1 x 5.9 cm = volume: 146 mL. Renal cortical thinning with increased renal echogenicity. Left Kidney: Renal measurements: 11.0 x 5.0 x 4.7 cm = volume: 134 mL. Renal cortical thinning with increased echogenicity. Suboptimally imaged secondary to patient's size and  immobility. Bladder: Appears normal for degree of bladder distention. Other: Gallbladder sludge incidentally noted. Prominent spleen, suboptimally evaluated. IMPRESSION: 1.  No hydronephrosis. 2. Renal cortical thinning with increased echogenicity, suggesting medical renal disease. 3. Gallbladder sludge. 4. Probable splenomegaly. Electronically Signed   By: Abigail Miyamoto M.D.   On: 07/29/2019 08:38        Scheduled Meds: . amLODipine  10 mg Oral Daily  . atorvastatin  40 mg Oral QHS  . chlorhexidine  15 mL Mouth Rinse BID  . Chlorhexidine Gluconate Cloth  6 each Topical Daily  . docusate sodium  200 mg Oral QHS  . feeding supplement (ENSURE ENLIVE)  237 mL Oral TID BM  . feeding supplement (PRO-STAT SUGAR FREE 64)  30 mL Oral BID  . fenofibrate  54 mg Oral Daily  . insulin aspart  0-20 Units Subcutaneous Q4H  . insulin glargine  30 Units Subcutaneous QHS  . levothyroxine  112 mcg Oral QAC breakfast  . mouth rinse  15 mL Mouth Rinse q12n4p  . methylPREDNISolone (SOLU-MEDROL) injection  40 mg Intravenous Q12H  . multivitamin with minerals  1 tablet Oral Daily  . nystatin  5 mL Oral QID  . pantoprazole  40 mg Oral BID AC  . sertraline  100 mg Oral QPM  . sodium chloride flush  3 mL Intravenous Q12H   Continuous Infusions: . sodium chloride       LOS: 11 days     Georgette Shell, MD [facility]   If 7PM-7AM, please contact night-coverage [email or web address] Password [facility] 07/30/2019, 12:29 PM

## 2019-07-30 NOTE — Progress Notes (Signed)
NAME:  [name], MRN:  [id number], DOB:  07-15-1935, LOS: 4 ADMISSION DATE:  07/19/2019, CONSULTATION DATE:  11/12 REFERRING MD:  Roel Cluck, CHIEF COMPLAINT:  Recurrent respiratory failure   Brief History    84 y/o female was admitted on 11/11 with recurrent hypoxemic/hypercarbic respiratory failure requiring Bipap support in setting of diffuse pulmonary infiltrates of unknown origin  She underwent elective cardioversion for chronic AF on 10/19 and was discharged on 2 L O2.  She was admitted 10/26-11/10 for acute hypoxic respiratory failure with bilateral infiltrates, felt to be pneumonitis of unclear etiology.  Serology negative.  Discharged on 6 L oxygen and prednisone taper  Past Medical History  CKD Stage 3, Anemia of chronic disease,Atrial fibrillation on Elaquis, s/p cardioversion,OSA on CPAP,HFpEF ,HLD,Type II DM HTN,Hypothyroidism,Chronic ITP,PE 2010  Significant Hospital Events   Stopped eliquis 11/13 due to concern with hemoptysis report and ? alv hem on cxr/ ct  Consults:  11/12 >> PCCM  Procedures:   Echo 07/05/19 (p onset of sob/ infiltrates: Grade I diastolic dysfunction with Mild LAE, nl RA  Significant Diagnostic Tests:   10/27 CT Chest >> Widespread ground-glass opacities, small bilat pleural effusions   Micro Data:  11/10 >> Sars-CoV2 neg 11/10 >> MRSA PCR pos 11/11  Urine strep neg 11/11  Urine legionella neg   PCT  11/15  Neg    Antimicrobials:  11/11 Vanco  >>11/15 11/11 Cefepime  >>11/15 11/11 Azithromycin >> 11/15  Interim history/subjective:   Down to 40% / 25 L , saturation 93 to 95% range On BiPAP overnight   Objective   Blood pressure 108/70, pulse 75, temperature (!) 97.2 F (36.2 C), temperature source Axillary, resp. rate (!) 21, height '5\' 1"'$  (1.549 m), weight 105.9 kg, SpO2 92 %.    FiO2 (%):  [40 %] 40 %   Intake/Output Summary (Last 24 hours) at 07/30/2019 0915 Last data filed at 07/29/2019 1800 Gross per 24 hour   Intake 700 ml  Output -  Net 700 ml  sats 94% on 50% Filed Weights   07/28/19 0444 07/29/19 0500 07/30/19 0500  Weight: 105.7 kg 103.9 kg 105.9 kg    Examination: General appearance: Elderly woman, no distress, pleasant affect Eyes: Mild pallor no icterus HENT: NCAT; oropharynx, mucous membranes dry Neck: Trachea midline; no JVD Lungs: decreased breath sounds bilateral, no crackles, no wheeze, shallow breaths CV: RRR, S1, S2, no MRGs  Abdomen: Soft, non-tender; non-distended, BS present  Extremities: Dependent edema upper and lower extremities Skin: No rash Psych: Appropriate affect Neuro: Alert and interactive, no asterixis  Chest x-ray 11/20 personally reviewed which shows persistent bilateral infiltrates, unchanged from prior  Assessment & Plan:   Acute on chronic hypoxic respiratory failure Diffuse pulmonary infiltrates of unclear etiology PAF (cardioverted and in NSR since Jun 26 2019) CRI (stage III) Mild systolic (EF 82-95%) and grade I diastolic HF Obesity   OSA H/o ITP ( ROMIPLOSTIM & FERAHEME) Chronic anemia  DM DNR  Pulmonary problem list:  Recurrent acute on chronic hypoxemic  respiratory failure in setting of diffuse patchy infiltrates.  Drug  - no record of any of the usual suspects (chemo, amiodarone/nitrofurantoin)  Serology negative for Connective tissue dz/ BOOP > esr 120 prior to steroids  Hemorrhage on eliquis > d/c'd 11/13  Moderate pulmonary hypertension on echo Not a  good candidate for bronchoscopy    PLAN: Continue to drop FiO2,Maintain SPO2 greater than 88%. Currently tolerating 40% at 25 L. Continue BiPAP nightly or with sleep.  Decrease Solu-Medrol 40 every 12  Remains off antibiotics  Stool occult blood positive noted , hemoglobin stable, off Eliquis  Long discussion with daughter 11/21.  I think we can start mobilizing her out of bed and encourage her to improve her oral intake. Hypoxia very slowly but definitely improving.    Kara Mead MD. [place]. [facility]  If no response to pager , please call [phone]     07/30/2019 9:15 AM

## 2019-07-30 NOTE — Progress Notes (Signed)
Pt continues to express pain. Pt has been given both PRNs ordered, and MD notified. Pt also not wanting to turn to be cleaned due to pain. I was able to encourage turning, with help from another staff member, in order for patient to be changed. Pt expressing that she is "giving up". Pt is not wanting to use incentive spirometer or attempt to sit up. Family requesting to speak to Hospitalist. Dr. Rodena Piety paged and updated on situation. MD said she would come to bedside and talk with family.

## 2019-07-30 NOTE — Progress Notes (Signed)
Pt not wanting to take PO medications. Pt states it is difficult to swallow.

## 2019-07-31 ENCOUNTER — Inpatient Hospital Stay (HOSPITAL_COMMUNITY): Payer: Medicare Other

## 2019-07-31 LAB — BASIC METABOLIC PANEL
Anion gap: 11 (ref 5–15)
BUN: 98 mg/dL — ABNORMAL HIGH (ref 8–23)
CO2: 36 mmol/L — ABNORMAL HIGH (ref 22–32)
Calcium: 9.8 mg/dL (ref 8.9–10.3)
Chloride: 110 mmol/L (ref 98–111)
Creatinine, Ser: 2.18 mg/dL — ABNORMAL HIGH (ref 0.44–1.00)
GFR calc Af Amer: 24 mL/min — ABNORMAL LOW (ref >60)
GFR calc non Af Amer: 21 mL/min — ABNORMAL LOW (ref >60)
Glucose, Bld: 191 mg/dL — ABNORMAL HIGH (ref 70–99)
Potassium: 4.8 mmol/L (ref 3.5–5.1)
Sodium: 157 mmol/L — ABNORMAL HIGH (ref 135–145)

## 2019-07-31 LAB — GLUCOSE, CAPILLARY
Glucose-Capillary: 100 mg/dL — ABNORMAL HIGH (ref 70–99)
Glucose-Capillary: 152 mg/dL — ABNORMAL HIGH (ref 70–99)
Glucose-Capillary: 159 mg/dL — ABNORMAL HIGH (ref 70–99)
Glucose-Capillary: 163 mg/dL — ABNORMAL HIGH (ref 70–99)
Glucose-Capillary: 193 mg/dL — ABNORMAL HIGH (ref 70–99)
Glucose-Capillary: 215 mg/dL — ABNORMAL HIGH (ref 70–99)

## 2019-07-31 MED ORDER — METHYLPREDNISOLONE SODIUM SUCC 40 MG IJ SOLR
40.0000 mg | Freq: Every day | INTRAMUSCULAR | Status: DC
  Administered 2019-08-01 – 2019-08-04 (×4): 40 mg via INTRAVENOUS
  Filled 2019-07-31 (×4): qty 1

## 2019-07-31 MED ORDER — HYOSCYAMINE SULFATE 0.5 MG/ML IJ SOLN
0.2500 mg | Freq: Four times a day (QID) | INTRAMUSCULAR | Status: DC | PRN
  Administered 2019-07-31 – 2019-08-02 (×5): 0.25 mg via INTRAVENOUS
  Filled 2019-07-31 (×9): qty 0.5

## 2019-07-31 NOTE — Progress Notes (Signed)
NAME:  [name], MRN:  [id number], DOB:  [birth year], LOS: 12 ADMISSION DATE:  07/19/2019, CONSULTATION DATE:  11/12 REFERRING MD:  Roel Cluck, CHIEF COMPLAINT:  Recurrent respiratory failure   Brief History    84 y/o female was admitted on 11/11 with recurrent hypoxemic/hypercarbic respiratory failure requiring Bipap support in setting of diffuse pulmonary infiltrates of unknown origin  She underwent elective cardioversion for chronic AF on 10/19 and was discharged on 2 L O2.  She was admitted 10/26-11/10 for acute hypoxic respiratory failure with bilateral infiltrates, felt to be pneumonitis of unclear etiology.  Serology negative.  Discharged on 6 L oxygen and prednisone taper  Past Medical History  CKD Stage 3, Anemia of chronic disease,Atrial fibrillation on Elaquis, s/p cardioversion,OSA on CPAP,HFpEF ,HLD,Type II DM HTN,Hypothyroidism,Chronic ITP,PE 2010  Significant Hospital Events   Stopped eliquis 11/13 due to concern with hemoptysis report and ? alv hem on cxr/ ct  Consults:  11/12 >> PCCM  Procedures:   Echo 07/05/19 (p onset of sob/ infiltrates: Grade I diastolic dysfunction with Mild LAE, nl RA  Significant Diagnostic Tests:   10/27 CT Chest >> Widespread ground-glass opacities, small bilat pleural effusions   Micro Data:  11/10 >> Sars-CoV2 neg 11/10 >> MRSA PCR pos 11/11  Urine strep neg 11/11  Urine legionella neg   PCT  11/15  Neg    Antimicrobials:  11/11 Vanco  >>11/15 11/11 Cefepime  >>11/15 11/11 Azithromycin >> 11/15  Interim history/subjective:   Was transiently down to 35% FiO2 but then had desaturation and now back up to 40% and 30 L, lying on her side Recognize me. Has been started on morphine Afebrile   Objective   Blood pressure (!) 138/51, pulse 69, temperature 97.9 F (36.6 C), temperature source Oral, resp. rate 18, height 5' 1" (1.549 m), weight 102 kg, SpO2 94 %.    FiO2 (%):  [35 %-40 %] 40 %   Intake/Output Summary (Last  24 hours) at 07/31/2019 1026 Last data filed at 07/31/2019 0400 Gross per 24 hour  Intake 600 ml  Output -  Net 600 ml  sats 94% on 50% Filed Weights   07/29/19 0500 07/30/19 0500 07/31/19 0900  Weight: 103.9 kg 105.9 kg 102 kg    Examination: General appearance: Elderly woman, no distress, pleasant affect Eyes: Mild pallor no icterus HENT: NCAT; oropharynx, mucous membranes dry Neck: Trachea midline; no JVD Lungs: decreased breath sounds bilateral, no crackles, no wheeze, shallow breaths CV: RRR, S1, S2, no MRGs  Abdomen: Soft, non-tender; non-distended, BS present  Extremities: Dependent edema upper and lower extremities Skin: No rash Psych: Appropriate affect Neuro: Alert and interactive, no asterixis  Chest x-ray 11/23 personally reviewed -improvement in bilateral infiltrates  Assessment & Plan:   Acute on chronic hypoxic respiratory failure Diffuse pulmonary infiltrates of unclear etiology PAF (cardioverted and in NSR since Jun 26 2019) CRI (stage III) Mild systolic (EF 62-83%) and grade I diastolic HF Obesity   OSA H/o ITP ( ROMIPLOSTIM & FERAHEME) Chronic anemia  DM DNR  Pulmonary problem list:  Recurrent acute on chronic hypoxemic  respiratory failure in setting of diffuse patchy infiltrates.  Drug  - no record of any of the usual suspects (chemo, amiodarone/nitrofurantoin)  Serology negative for Connective tissue dz/ BOOP > esr 120 prior to steroids  Hemorrhage on eliquis > d/c'd 11/13  Moderate pulmonary hypertension on echo Not a  good candidate for bronchoscopy    PLAN: Continue to drop FiO2,Maintain SPO2 greater than 88%.  Currently tolerating 40% at 25 L. Changed to CPAP nightly Decrease Solu-Medrol 40 every 24 Remains off antibiotics  Stool occult blood positive noted , hemoglobin stable, off Eliquis Hypernatremia Per primary -hold Lasix  Summary Hypoxia is slowly but definitely improving, chest x-ray has improved. She is very deconditioned.  Care limitations have been decided. I would really like to see if she is able to participate with rehab   Rakesh Alva MD. FCCP. [facility]  If no response to pager , please call [phone]     07/31/2019 10:26 AM             

## 2019-07-31 NOTE — Progress Notes (Signed)
Daily Progress Note   Patient Name: [name]       Date: 07/31/2019 DOB: 1935/04/10  Age: 84 y.o. MRN#: [id number] Attending Physician: Georgette Shell, MD Primary Care Physician: Javier Glazier, MD Admit Date: 07/19/2019  Reason for Consultation/Follow-up: Establishing goals of care  Subjective: Daughters at bedside today.  Her primary complaint is abdominal pain.  Reports spastic in nature with squeezing sensation.  No exacerbating factors and a little improved with morphine.  Does not radiate. Reports frequent BMs.  Length of Stay: 12  Current Medications: Scheduled Meds:  . amLODipine  10 mg Oral Daily  . atorvastatin  40 mg Oral QHS  . chlorhexidine  15 mL Mouth Rinse BID  . Chlorhexidine Gluconate Cloth  6 each Topical Daily  . docusate sodium  200 mg Oral QHS  . feeding supplement (ENSURE ENLIVE)  237 mL Oral TID BM  . feeding supplement (PRO-STAT SUGAR FREE 64)  30 mL Oral BID  . fenofibrate  54 mg Oral Daily  . insulin aspart  0-20 Units Subcutaneous Q4H  . insulin glargine  30 Units Subcutaneous QHS  . levothyroxine  112 mcg Oral QAC breakfast  . mouth rinse  15 mL Mouth Rinse q12n4p  . [START ON 08/01/2019] methylPREDNISolone (SOLU-MEDROL) injection  40 mg Intravenous Daily  . multivitamin with minerals  1 tablet Oral Daily  . nystatin  5 mL Oral QID  . nystatin cream   Topical BID  . pantoprazole  40 mg Oral BID AC  . sertraline  100 mg Oral QPM  . sodium chloride flush  3 mL Intravenous Q12H    Continuous Infusions: . sodium chloride      PRN Meds: sodium chloride, acetaminophen **OR** acetaminophen, albuterol, bisacodyl, hydrALAZINE, HYDROcodone-acetaminophen, hyoscyamine, morphine injection, ondansetron **OR** ondansetron (ZOFRAN) IV,  oxyCODONE, sodium chloride, sodium chloride flush  Physical Exam          Remains awake and interactive She has dependent edema Abdomen is also distended Diminished breath sounds  Vital Signs: BP (!) 162/81 (BP Location: Left Wrist)   Pulse 71   Temp (!) 97.5 F (36.4 C) (Axillary)   Resp 19   Ht 5\' 1"  (1.549 m)   Wt 102 kg   LMP  (LMP Unknown)   SpO2 90%   BMI 42.49 kg/m  SpO2: SpO2: 90 % O2 Device: O2 Device: High Flow Nasal Cannula O2 Flow Rate: O2 Flow Rate (L/min): 30 L/min  Intake/output summary:   Intake/Output Summary (Last 24 hours) at 07/31/2019 2231 Last data filed at 07/31/2019 2118 Gross per 24 hour  Intake 403 ml  Output -  Net 403 ml   LBM: Last BM Date: 07/31/19 Baseline Weight: Weight: 105.7 kg Most recent weight: Weight: 102 kg       Palliative Assessment/Data:      Patient Active Problem List   Diagnosis Date Noted  . Shortness of breath   . Palliative care by specialist   . Goals of care, counseling/discussion   . Pneumonitis 07/14/2019  . Pressure injury of skin 07/10/2019  . HCAP (healthcare-associated pneumonia)   . Acute hypoxemic respiratory failure ([place]) 07/03/2019  . Atypical atrial flutter ([place])   . Normocytic anemia 05/18/2017  . Chronic ITP (idiopathic thrombocytopenia) (HCC) 01/25/2017  . Chronic kidney disease (CKD) stage G4/A1, severely decreased glomerular filtration rate (GFR) between 15-29 mL/min/1.73 square meter and albuminuria creatinine ratio less than 30 mg/g (HCC) 09/04/2015  . Chronic kidney disease 09/04/2015  . Acute respiratory failure with hypoxia ([place]) 08/24/2015  . GOUT 06/23/2010  . DERMATITIS 01/16/2010  . BACK PAIN 06/13/2009  . MYALGIA 06/13/2009  . Anxiety state 12/13/2008  . LEG EDEMA, CHRONIC 11/08/2008  . Sleep apnea 10/26/2008  . Hypothyroidism 09/27/2008  . HYPERCHOLESTEROLEMIA 09/27/2008  . OBESITY 09/27/2008  . DEPRESSION 09/27/2008  . Essential hypertension 09/27/2008  . ALLERGIC  RHINITIS 09/27/2008  . ASTHMA, CHILDHOOD 09/27/2008    Palliative Care Assessment & Plan   Patient Profile:  84 yo lady who lives at [facility], has 2 daughters, with the following active hospital issues: Recurrent acute on chronic hypoxic respiratory failure Diffuse pulmonary infiltrates of unclear etiology PAF (cardioverted and in NSR since Jun 26 2019) CRI (stage III) Mild systolic (EF 75-64%) and grade I diastolic HF Obesity   OSA H/o ITP ( ROMIPLOSTIM & FERAHEME) Chronic anemia  DM  Assessment:  generalized weakness Shortness of breath Functional decline recently High O2 requirements  Recommendations/Plan: - Goals of care: Discussions are ongoing with the patient and her 2 daughters.  D/w Dr. Elsworth Soho and hypoxia improving. They wish to continue with current mode of care for now.  Will continue to follow/advance conversation based on clinical course. - Abdominal pain: Reports spastic abdominal pain.  Plan for trial of levsin.  Continue morphine as needed.   Code Status:    Code Status Orders  (From admission, onward)         Start     Ordered   07/19/19 2131  Do not attempt resuscitation (DNR)  Continuous    Question Answer Comment  In the event of cardiac or respiratory ARREST Do not call a "code blue"   In the event of cardiac or respiratory ARREST Do not perform Intubation, CPR, defibrillation or ACLS   In the event of cardiac or respiratory ARREST Use medication by any route, position, wound care, and other measures to relive pain and suffering. May use oxygen, suction and manual treatment of airway obstruction as needed for comfort.      07/19/19 2130        Code Status History    Date Active Date Inactive Code Status Order ID Comments User Context   07/05/2019 1951 07/19/2019 0014 Partial Code [id number]  Kathi Ludwig, MD ED   07/03/2019 2358 07/05/2019 1951 Partial Code [id number]  Welford Roche,  MD ED   07/03/2019 2112 07/03/2019  2358 DNR [id number]  Welford Roche, MD ED   08/24/2015 2143 08/29/2015 1750 Full Code [id number]  Toy Baker, MD Inpatient   Advance Care Planning Activity    Advance Directive Documentation     Most Recent Value  Type of Advance Directive  Living will, Healthcare Power of Attorney  Pre-existing out of facility DNR order (yellow form or pink MOST form)  Yellow form placed in chart (order not valid for inpatient use)  "MOST" Form in Place?  -       Prognosis:   guarded   Discharge Planning:  To Be Determined  Care plan was discussed with  Patient, RN, daughters, TRH MD.   Thank you for allowing the Palliative Medicine Team to assist in the care of this patient.   Total Time 30 Prolonged Time Billed  no       Greater than 50%  of this time was spent counseling and coordinating care related to the above assessment and plan.  Micheline Rough, MD  Please contact Palliative Medicine Team phone at [phone] for questions and concerns.

## 2019-07-31 NOTE — Progress Notes (Signed)
PROGRESS NOTE    [name]  AOZ:[id number] DOB: 05/03/1935 DOA: 07/19/2019 PCP: Javier Glazier, MD    Brief Narrative:  84 y.o.femalewith medical history significant of chronic ITP, CKD stage3, anemia of chronic disease, A.fib on Eliquis,sleep apnea on CPAP history of CHF HLD history of pulmonary embolism in 2010 diabetes type 2, HTN,hypothyroidism, pressure ulcer Presented withworsening shortness of breathfromSNF. Multiple recent admissions in October, most recentlyshe was discharged to SNF 1 day ago from [facility] where she was treated for acute respiratory failure and thought to be due to pneumonitis and was sent home with tapering dose of steroid. She was having increased oxygen requirement, initially managed with nonrebreather. BiPAP was startedlaterdue to worsening respiratory status. Chest x-ray with worsening of bilateral infiltrate, broader differential which includes pneumonitis, bilateral pneumonia, ARDS, pulmonary vascular congestion. She was started on broad-spectrum antibiotics,Lasix and steroids. Antibiotics were discontinued after having normal procalcitonin multiple times. Continue steroid and give Lasix as needed. Currently appears dry. PCCM is on board and does not think that there is much to offer for her.   Assessment & Plan:   Principal Problem:   Acute respiratory failure with hypoxia (HCC) Active Problems:   Hypothyroidism   HYPERCHOLESTEROLEMIA   OBESITY   Essential hypertension   Chronic kidney disease (CKD) stage G4/A1, severely decreased glomerular filtration rate (GFR) between 15-29 mL/min/1.73 square meter and albuminuria creatinine ratio less than 30 mg/g (HCC)   Chronic kidney disease   Chronic ITP (idiopathic thrombocytopenia) (HCC)   Normocytic anemia   Atypical atrial flutter (HCC)   HCAP (healthcare-associated pneumonia)   Pneumonitis   Shortness of breath   Palliative care by specialist   Goals of care,  counseling/discussion   #1 acute on chronic hypoxic and hypercapnic respiratory failure of unclear etiology-prior to admission she was not on oxygen.  She is still requiring high oxygen to keep her sats above 88%.  Solu-Medrol decreased to 40 mg daily today.  PCCM following.  #2 hypothermia resolved  #3 hypertension continue amlodipine and hydralazine as needed. Blood pressure143/36.  #4 AKI on CKD stage III-creatinine elevated but stable.  #5 diastolic heart failure with mild systolic heart failure-not on  diuretics  #6 chronic ITPplatelet count is 112 down from 150 on 07/26/2019. Monitor closely.  #7 type 2 diabetesbloodsugarimproved. Blood sugar  100-1 63.  Continue Lantus 30 units nightly.   #8 atrial fibrillation chronic status post cardioversion 06/26/2019 now in sinus rhythm. Eliquis discontinued due to recent hemoptysis 07/21/2019 and heme positive stools.  She is not a candidate for further GI work-up.  #9 hypothyroidism continue Synthroid.  #10 obstructive sleep apnea on BiPAP  #11 hypernatremiaunfortunately her hyponatremia has been getting worse off of Lasix.  She has only received Lasix twice last week. Her p.o. intake has been very decreased.  #12 depression continue Zoloft  Pressure Injury 07/08/19 Buttocks Right;Left;Medial;Lower Stage II -  Partial thickness loss of dermis presenting as a shallow open ulcer with a red, pink wound bed without slough. Red, nonblanchable, (Active)  07/08/19 1000  Location: Buttocks  Location Orientation: Right;Left;Medial;Lower  Staging: Stage II -  Partial thickness loss of dermis presenting as a shallow open ulcer with a red, pink wound bed without slough.  Wound Description (Comments): Red, nonblanchable,  Present on Admission: Yes      Nutrition Problem: Increased nutrient needs Etiology: chronic illness(CHF)     Signs/Symptoms: estimated needs    Interventions: Glucerna shake, Prostat, MVI   Estimated body mass index is 42.49  kg/m as calculated from the following:   Height as of this encounter: 5\' 1"  (1.549 m).   Weight as of this encounter: 102 kg.     Subjective:  She is resting in bed smiling she feels she is better her pain is better Objective: Vitals:   07/31/19 0805 07/31/19 0900 07/31/19 1000 07/31/19 1200  BP:   (!) 143/36   Pulse: 69  67   Resp: 18  (!) 8   Temp:    97.7 F (36.5 C)  TempSrc:    Axillary  SpO2: 94%  (!) 88%   Weight:  102 kg    Height:        Intake/Output Summary (Last 24 hours) at 07/31/2019 1400 Last data filed at 07/31/2019 0400 Gross per 24 hour  Intake 600 ml  Output -  Net 600 ml   Filed Weights   07/29/19 0500 07/30/19 0500 07/31/19 0900  Weight: 103.9 kg 105.9 kg 102 kg    Examination:  General exam: Appears calm and comfortable  Respiratory system: Diminished breath sounds at the bases to auscultation. Respiratory effort normal. Cardiovascular system: S1 & S2 heard, RRR. No JVD, murmurs, rubs, gallops or clicks. No pedal edema. Gastrointestinal system: Abdomen is nondistended, soft and nontender. No organomegaly or masses felt. Normal bowel sounds heard. Central nervous system: Alert and oriented. No focal neurological deficits. Extremities: Symmetric 5 x 5 power. Skin: No rashes, lesions or ulcers Psychiatry: Judgement and insight appear normal. Mood & affect appropriate.     Data Reviewed: I have personally reviewed following labs and imaging studies  CBC: Recent Labs  Lab 07/25/19 0214 07/26/19 0151 07/29/19 0153 07/30/19 0719  WBC 10.3 8.7 8.9 9.8  HGB 8.1* 8.0* 8.1* 8.4*  HCT 28.2* 28.3* 27.2* 29.3*  MCV 101.8* 103.3* 101.9* 103.9*  PLT 152 150 111* 578*   Basic Metabolic Panel: Recent Labs  Lab 07/25/19 0214 07/26/19 0151  07/27/19 1816 07/28/19 0736 07/29/19 0153 07/30/19 0719 07/31/19 0238  NA 149* 149*   < > 152* 157* 152* 154* 157*  K 5.0 4.8   < > 4.9 5.3* 5.1 3.9 4.8  CL 107 107    < > 104 109 105 105 110  CO2 36* 35*   < > 38* 37* 37* 39* 36*  GLUCOSE 142* 193*   < > 461* 89 284* 88 191*  BUN 91* 92*   < > 99* 99* 99* 97* 98*  CREATININE 1.94* 2.02*   < > 2.10* 2.16* 2.15* 2.16* 2.18*  CALCIUM 9.4 9.3   < > 9.7 10.2 9.5 9.7 9.8  PHOS 4.2 4.6  --   --   --   --   --   --    < > = values in this interval not displayed.   GFR: Estimated Creatinine Clearance: 22.2 mL/min (A) (by C-G formula based on SCr of 2.18 mg/dL (H)). Liver Function Tests: Recent Labs  Lab 07/25/19 0214 07/26/19 0151  ALBUMIN 3.1* 3.1*   No results for input(s): LIPASE, AMYLASE in the last 168 hours. No results for input(s): AMMONIA in the last 168 hours. Coagulation Profile: No results for input(s): INR, PROTIME in the last 168 hours. Cardiac Enzymes: No results for input(s): CKTOTAL, CKMB, CKMBINDEX, TROPONINI in the last 168 hours. BNP (last 3 results) No results for input(s): PROBNP in the last 8760 hours. HbA1C: No results for input(s): HGBA1C in the last 72 hours. CBG: Recent Labs  Lab 07/30/19 1928 07/30/19 2316 07/31/19 0339 07/31/19  0753 07/31/19 1216  GLUCAP 132* 183* 163* 100* 152*   Lipid Profile: No results for input(s): CHOL, HDL, LDLCALC, TRIG, CHOLHDL, LDLDIRECT in the last 72 hours. Thyroid Function Tests: No results for input(s): TSH, T4TOTAL, FREET4, T3FREE, THYROIDAB in the last 72 hours. Anemia Panel: No results for input(s): VITAMINB12, FOLATE, FERRITIN, TIBC, IRON, RETICCTPCT in the last 72 hours. Sepsis Labs: No results for input(s): PROCALCITON, LATICACIDVEN in the last 168 hours.  Recent Results (from the past 240 hour(s))  Culture, blood (routine x 2)     Status: None   Collection Time: 07/24/19  1:07 PM   Specimen: BLOOD  Result Value Ref Range Status   Specimen Description   Final    BLOOD LEFT ARM Performed at [facility] [address], [place], [place] [address]    Special Requests   Final    BOTTLES DRAWN AEROBIC  AND ANAEROBIC Blood Culture results may not be optimal due to an inadequate volume of blood received in culture bottles Performed at [facility] [address], [place], [place] [address]    Culture   Final    NO GROWTH 5 DAYS Performed at [facility], [place] [address] [place], [place] [address]    Report Status 07/29/2019 FINAL  Final  Culture, blood (routine x 2)     Status: None   Collection Time: 07/24/19  1:14 PM   Specimen: BLOOD  Result Value Ref Range Status   Specimen Description   Final    BLOOD LEFT ARM Performed at [facility] [address] [place], [place] [address]    Special Requests   Final    BOTTLES DRAWN AEROBIC AND ANAEROBIC Blood Culture adequate volume Performed at [facility] [address], [place], [place] [address]    Culture   Final    NO GROWTH 5 DAYS Performed at [facility], [place] [address], [place], [place] [address]    Report Status 07/29/2019 FINAL  Final  Culture, Urine     Status: Abnormal   Collection Time: 07/27/19  3:19 PM   Specimen: Urine, Random  Result Value Ref Range Status   Specimen Description   Final    URINE, RANDOM Performed at [facility] [address], [place], [place] [address]    Special Requests   Final    NONE Performed at [facility], [place] [address] [place], [place] [address]    Culture (A)  Final    <10,000 COLONIES/mL INSIGNIFICANT GROWTH Performed at [facility] [address], [place], [place] [address]    Report Status 07/28/2019 FINAL  Final  Culture, blood (routine x 2)     Status: None (Preliminary result)   Collection Time: 07/27/19  3:36 PM   Specimen: BLOOD  Result Value Ref Range Status   Specimen Description   Final    BLOOD LEFT ANTECUBITAL Performed at [facility] [address] [place], [place] [address]    Special Requests   Final    BOTTLES DRAWN  AEROBIC AND ANAEROBIC Blood Culture adequate volume Performed at [facility] [address] [place], [place] [address]    Culture   Final    NO GROWTH 4 DAYS Performed at [facility], [place] [address] [place],  [address]    Report Status PENDING  Incomplete  Culture, blood (routine x 2)  Status: None (Preliminary result)   Collection Time: 07/27/19  3:36 PM   Specimen: BLOOD  Result Value Ref Range Status   Specimen Description   Final    BLOOD LEFT ARM Performed at [facility] [address], [place], [place] [address]    Special Requests   Final    BOTTLES DRAWN AEROBIC AND ANAEROBIC Blood Culture adequate volume Performed at [facility] [address], [place], [place] [address]    Culture   Final    NO GROWTH 4 DAYS Performed at [facility], [place] [address], [place], [place] [address]    Report Status PENDING  Incomplete         Radiology Studies: Dg Chest Port 1 View  Result Date: 07/31/2019 CLINICAL DATA:  84 year old female with shortness of breath. EXAM: PORTABLE CHEST 1 VIEW COMPARISON:  07/28/2019 and earlier. FINDINGS: Portable AP upright view at 0411 hours. Stable lung volumes and mediastinal contours. Continued bilateral pulmonary interstitial opacity although ventilation has improved since 07/27/2019. No superimposed pneumothorax or consolidation. No pleural effusion is evident. Paucity of bowel gas in the upper abdomen. Visualized tracheal air column is within normal limits. No acute osseous abnormality identified. IMPRESSION: 1. Improved bilateral ventilation since 07/27/2019 with continued widespread interstitial opacity. 2. No new cardiopulmonary abnormality. Electronically Signed   By: Genevie Ann M.D.   On: 07/31/2019 07:43        Scheduled Meds: . amLODipine  10 mg Oral Daily  . atorvastatin  40 mg Oral QHS  . chlorhexidine  15 mL Mouth Rinse BID  . Chlorhexidine  Gluconate Cloth  6 each Topical Daily  . docusate sodium  200 mg Oral QHS  . feeding supplement (ENSURE ENLIVE)  237 mL Oral TID BM  . feeding supplement (PRO-STAT SUGAR FREE 64)  30 mL Oral BID  . fenofibrate  54 mg Oral Daily  . insulin aspart  0-20 Units Subcutaneous Q4H  . insulin glargine  30 Units Subcutaneous QHS  . levothyroxine  112 mcg Oral QAC breakfast  . mouth rinse  15 mL Mouth Rinse q12n4p  . [START ON 08/01/2019] methylPREDNISolone (SOLU-MEDROL) injection  40 mg Intravenous Daily  . multivitamin with minerals  1 tablet Oral Daily  . nystatin  5 mL Oral QID  . nystatin cream   Topical BID  . pantoprazole  40 mg Oral BID AC  . sertraline  100 mg Oral QPM  . sodium chloride flush  3 mL Intravenous Q12H   Continuous Infusions: . sodium chloride       LOS: 12 days     Georgette Shell, MD [facility] If 7PM-7AM, please contact night-coverage [email or web address] Password [email or web address] 07/31/2019, 2:00 PM

## 2019-07-31 NOTE — Progress Notes (Signed)
PT Cancellation Note  Patient Details Name: [name] MRN: [id number] DOB: 11/26/1934   Cancelled Treatment:    Reason Eval/Treat Not Completed: Patient declined, no reason specified   Pt declined PT due to not feeling like it right now.  Daughter present and reports pt received morphine recently.  Additionally, they are considering more comfort care.  Requested PT to f/u at later time.  Will f/u as able.    Mikael Spray Roslin Norwood 07/31/2019, 1:30 PM

## 2019-08-01 LAB — BASIC METABOLIC PANEL
Anion gap: 12 (ref 5–15)
BUN: 92 mg/dL — ABNORMAL HIGH (ref 8–23)
CO2: 33 mmol/L — ABNORMAL HIGH (ref 22–32)
Calcium: 9.6 mg/dL (ref 8.9–10.3)
Chloride: 113 mmol/L — ABNORMAL HIGH (ref 98–111)
Creatinine, Ser: 2.48 mg/dL — ABNORMAL HIGH (ref 0.44–1.00)
GFR calc Af Amer: 20 mL/min — ABNORMAL LOW (ref >60)
GFR calc non Af Amer: 18 mL/min — ABNORMAL LOW (ref >60)
Glucose, Bld: 133 mg/dL — ABNORMAL HIGH (ref 70–99)
Potassium: 4.6 mmol/L (ref 3.5–5.1)
Sodium: 158 mmol/L — ABNORMAL HIGH (ref 135–145)

## 2019-08-01 LAB — CULTURE, BLOOD (ROUTINE X 2)
Culture: NO GROWTH
Culture: NO GROWTH
Special Requests: ADEQUATE
Special Requests: ADEQUATE

## 2019-08-01 LAB — GLUCOSE, CAPILLARY
Glucose-Capillary: 89 mg/dL (ref 70–99)
Glucose-Capillary: 118 mg/dL — ABNORMAL HIGH (ref 70–99)
Glucose-Capillary: 201 mg/dL — ABNORMAL HIGH (ref 70–99)
Glucose-Capillary: 289 mg/dL — ABNORMAL HIGH (ref 70–99)
Glucose-Capillary: 129 mg/dL — ABNORMAL HIGH (ref 70–99)

## 2019-08-01 MED ORDER — ROMIPLOSTIM 250 MCG ~~LOC~~ SOLR
1.0000 ug/kg | Freq: Once | SUBCUTANEOUS | Status: AC
  Administered 2019-08-01: 100 ug via SUBCUTANEOUS
  Filled 2019-08-01: qty 0.2

## 2019-08-01 MED ORDER — AMLODIPINE BESYLATE 5 MG PO TABS
2.5000 mg | ORAL_TABLET | Freq: Every day | ORAL | Status: DC
  Administered 2019-08-02 – 2019-08-07 (×6): 2.5 mg via ORAL
  Filled 2019-08-01 (×6): qty 1

## 2019-08-01 NOTE — Progress Notes (Signed)
Pt. taken off BiPAP V-60 and placed back on HHFNC 30 lpm/30% Fi02, tolerated well.

## 2019-08-01 NOTE — Progress Notes (Signed)
Physical Therapy Treatment Patient Details Name: [name] MRN: [id number] DOB: 01/07/1935 Today's Date: 08/01/2019    History of Present Illness Pt is 84 y/o F with PMH: CKD stage 3, Anemia, Afib (on Elaquis), OSA on CPAP. Was d/c'ed to SNF from [facility] for respiratory failure on 07/18/19 (several recent admissions). Pt re-presented to acute setting with increasing oxygen requirement  on 11/11, required BiPaPand found to have worsening bilateral lung infiltrates on CXR, pneumonitis, b/l PNA, ARDS.    PT Comments    Pt was lethargic with treatment but did tolerate sitting EOB.  Pt with improved vitals sitting EOB this attempt compared to last visit.  She remained too weak and lethargic to attempt standing safely.  Continues to required mod-max A of 2 for transfers but improved EOB balance and tolerance.     Follow Up Recommendations  SNF     Equipment Recommendations  None recommended by PT    Recommendations for Other Services       Precautions / Restrictions Precautions Precautions: Fall Restrictions Weight Bearing Restrictions: No    Mobility  Bed Mobility Overal bed mobility: Needs Assistance Bed Mobility: Supine to Sit;Sit to Supine     Supine to sit: Mod assist;+2 for physical assistance;HOB elevated Sit to supine: Mod assist;+2 for physical assistance;HOB elevated   General bed mobility comments: Mod assist for supine<>sit for trunk elevation and lowering, LE management, scooting to and from EOB with use of bed pad.  Requires max +2 for scooting up in bed upon return to supine. Pt sat EOB ~10 minutes, RR 15 , HR 70-80 bpm, O2 sats 94% on HFNC  Transfers                 General transfer comment: too lethargic and weak to attempt  Ambulation/Gait                 Stairs             Wheelchair Mobility    Modified Rankin (Stroke Patients Only)       Balance   Sitting-balance support: Bilateral upper extremity supported;Feet  unsupported Sitting balance-Leahy Scale: Fair Sitting balance - Comments: EOB for ~10 minutes; pt could maintain with SBA but preferred min A for comfort                                    Cognition Arousal/Alertness: Lethargic(required increased time to arouse, still somewhat lethargc) Behavior During Therapy: WFL for tasks assessed/performed Overall Cognitive Status: Within Functional Limits for tasks assessed                                 General Comments: Pt A&O x4, requires increased time to respond to questions but responds/acts appropriately 100% of the time.      Exercises General Exercises - Lower Extremity Ankle Circles/Pumps: AROM;Both;15 reps;Seated Long Arc Quad: AROM;Both;Seated;15 reps    General Comments        Pertinent Vitals/Pain Pain Assessment: No/denies pain    Home Living                      Prior Function            PT Goals (current goals can now be found in the care plan section) Progress towards PT goals: Progressing toward goals  Frequency    Min 2X/week      PT Plan Current plan remains appropriate    Co-evaluation              AM-PAC PT "6 Clicks" Mobility   Outcome Measure  Help needed turning from your back to your side while in a flat bed without using bedrails?: Total Help needed moving from lying on your back to sitting on the side of a flat bed without using bedrails?: Total Help needed moving to and from a bed to a chair (including a wheelchair)?: Total Help needed standing up from a chair using your arms (e.g., wheelchair or bedside chair)?: Total Help needed to walk in hospital room?: Total Help needed climbing 3-5 steps with a railing? : Total 6 Click Score: 6    End of Session Equipment Utilized During Treatment: Oxygen Activity Tolerance: Patient limited by fatigue Patient left: with call bell/phone within reach;in bed;with SCD's reapplied;with bed alarm set Nurse  Communication: Mobility status PT Visit Diagnosis: Muscle weakness (generalized) (M62.81);Other abnormalities of gait and mobility (R26.89)     Time: 1015-1040 PT Time Calculation (min) (ACUTE ONLY): 25 min  Charges:  $Therapeutic Activity: 23-37 mins                     Maggie Font, PT Acute Rehab Services Pager [phone] [facility] [phone] [facility] [phone]    Karlton Lemon 08/01/2019, 11:33 AM

## 2019-08-01 NOTE — Progress Notes (Signed)
Nutrition Follow-up  DOCUMENTATION CODES:   Morbid obesity  INTERVENTION:   -Ensure Enlive po TID, each supplement provides 350 kcal and 20 grams of protein -Prostat liquid protein PO 30 ml BID with meals, each supplement provides 100 kcal, 15 grams protein. -Multivitamin with minerals daily  If poor PO continues, recommend considering nutrition support if this is within pt's [place].  NUTRITION DIAGNOSIS:   Increased nutrient needs related to chronic illness(CHF) as evidenced by estimated needs.  Ongoing.  GOAL:   Patient will meet greater than or equal to 90% of their needs  Progressing.  MONITOR:   PO intake, Supplement acceptance, Labs, Weight trends, Skin, I & O's  ASSESSMENT:   84 year old female who presented to the ED on 11/11 with SOB. Pt had just been discharged from the hospital after admission for respiratory failure secondary to pneumonitis. PMH of chronic ITP, CKD stage III, anemia, atrial fibrillation, CHF, HLD, T2DM, HTN.  Patient continued BiPAP at night. Palliative care now following.   Patient currently consuming 1 Ensure and 2 Prostat supplements daily. No PO has been documented for meals since 11/18.   Admission weight: 233 lbs. Current weight: 222 lbs.  I/Os: -3.5L since admit  Medications: Multivitamin with minerals daily Labs reviewed: CBGs: 89-118 Elevated Na  Diet Order:   Diet Order            Diet Carb Modified Fluid consistency: Thin; Room service appropriate? Yes  Diet effective now              EDUCATION NEEDS:   Not appropriate for education at this time  Skin:  Skin Assessment: Skin Integrity Issues: Skin Integrity Issues:: Stage II Stage II: right buttocks  Last BM:  11/24- type 6  Height:   Ht Readings from Last 1 Encounters:  07/20/19 5\' 1"  (1.549 m)    Weight:   Wt Readings from Last 1 Encounters:  08/01/19 100.9 kg    Ideal Body Weight:  47.7 kg  BMI:  Body mass index is 42.03 kg/m.  Estimated  Nutritional Needs:   Kcal:  1700-1900  Protein:  90-110 grams  Fluid:  >/= 1.7 L  Clayton Bibles, MS, RD, LDN Inpatient Clinical Dietitian Pager: [phone] After Hours Pager: [phone]

## 2019-08-01 NOTE — Progress Notes (Signed)
PROGRESS NOTE    [name]  KYH:[id number] DOB: 03-31-1935 DOA: 07/19/2019 PCP: Javier Glazier, MD    Brief Narrative: 84 y.o.femalewith medical history significant of chronic ITP, CKD stage3, anemia of chronic disease, A.fib on Eliquis,sleep apnea on CPAP history of CHF HLD history of pulmonary embolism in 2010 diabetes type 2, HTN,hypothyroidism, pressure ulcer Presented withworsening shortness of breathfromSNF. Multiple recent admissions in October, most recentlyshe was discharged to SNF 1 day ago from [facility] where she was treated for acute respiratory failure and thought to be due to pneumonitis and was sent home with tapering dose of steroid. She was having increased oxygen requirement, initially managed with nonrebreather. BiPAP was startedlaterdue to worsening respiratory status. Chest x-ray with worsening of bilateral infiltrate, broader differential which includes pneumonitis, bilateral pneumonia, ARDS, pulmonary vascular congestion. She was started on broad-spectrum antibiotics,Lasix and steroids. Antibiotics were discontinued after having normal procalcitonin multiple times. Continue steroid and give Lasix as needed. Currently appears dry. PCCM is on board and does not think that there is much to offer for her. Assessment & Plan:   Principal Problem:   Acute respiratory failure with hypoxia (HCC) Active Problems:   Hypothyroidism   HYPERCHOLESTEROLEMIA   OBESITY   Essential hypertension   Chronic kidney disease (CKD) stage G4/A1, severely decreased glomerular filtration rate (GFR) between 15-29 mL/min/1.73 square meter and albuminuria creatinine ratio less than 30 mg/g (HCC)   Chronic kidney disease   Chronic ITP (idiopathic thrombocytopenia) (HCC)   Normocytic anemia   Atypical atrial flutter (HCC)   HCAP (healthcare-associated pneumonia)   Pneumonitis   Shortness of breath   Palliative care by specialist   Goals of care,  counseling/discussion   #1 acute on chronic hypoxic and hypercapnic respiratory failure of unclear etiology-prior to admission she was not on oxygen.  She is still requiring high oxygen to keep her sats above 88%.  Solu-Medrol decreased to 40 mg daily today.  PCCM following and recommends slow longer duration of tapering of p.o. prednisone.  #2 hypothermia resolved  #3 hypertension  Blood pressure109/27 decrease amlodipine  #4 AKI on CKD stage III-creatinine trending up in spite of not being on any diuretics.  Patient's p.o. intake is very minimal.  Her creatinine is 2.48 today.  Unfortunately I cannot give her fluids due to ? Fluid overload by chest x-ray  #5 diastolic heart failure with mild systolic heart failure-not ondiuretics  #6 chronic ITPplatelet count is 112down from 150 on 07/26/2019. Monitor closely.  #7 type 2 diabetesbloodsugarimproved. Blood sugar89-1 93 continue Lantus 30 units at bedtime   #8 atrial fibrillation chronic status post cardioversion 06/26/2019 now in sinus rhythm. Eliquis discontinued due to recent hemoptysis 07/21/2019 and heme positive stools.  She is not a candidate for further GI work-up.  #9 hypothyroidism continue Synthroid.  #10 obstructive sleep apnea on BiPAP  #11 hypernatremiaunfortunately her hyponatremia has been getting worse off of Lasix.  She has only received Lasix twice last week. Her p.o. intake has been very decreased.  #12 depression continue Zoloft   Pressure Injury 07/08/19 Buttocks Right;Left;Medial;Lower Stage II -  Partial thickness loss of dermis presenting as a shallow open ulcer with a red, pink wound bed without slough. Red, nonblanchable, (Active)  07/08/19 1000  Location: Buttocks  Location Orientation: Right;Left;Medial;Lower  Staging: Stage II -  Partial thickness loss of dermis presenting as a shallow open ulcer with a red, pink wound bed without slough.  Wound Description (Comments): Red,  nonblanchable,  Present on Admission: Yes  Nutrition Problem: Increased nutrient needs Etiology: chronic illness(CHF)     Signs/Symptoms: estimated needs    Interventions: Glucerna shake, Prostat, MVI  Estimated body mass index is 42.03 kg/m as calculated from the following:   Height as of this encounter: 5\' 1"  (1.549 m).   Weight as of this encounter: 100.9 kg.  DVT prophylaxis:SCDs due to thrombocytopenia  code Status:DO NOT RESUSCITATE  family Communication:Discussed with daughter  disposition Plan:Pending clinical improvement  Consultants:  PCCM oncology and palliative care  Procedures:None Antimicrobials:None  Subjective: Resting in bed does not appear to be in any distress when she is resting but very dyspneic on exertion  Objective: Vitals:   08/01/19 0800 08/01/19 0822 08/01/19 1200 08/01/19 1428  BP: (!) 152/41  (!) 109/27   Pulse: 70 68 68 65  Resp: 20 20 17 17   Temp: 98 F (36.7 C)  97.7 F (36.5 C)   TempSrc: Oral  Oral   SpO2: 96% 93% 100% 100%  Weight:      Height:        Intake/Output Summary (Last 24 hours) at 08/01/2019 1528 Last data filed at 07/31/2019 2118 Gross per 24 hour  Intake 3 ml  Output -  Net 3 ml   Filed Weights   07/30/19 0500 07/31/19 0900 08/01/19 0500  Weight: 105.9 kg 102 kg 100.9 kg    Examination:  General exam: Appears calm and comfortable  Respiratory system: Coarse breath sounds to auscultation. Respiratory effort normal. Cardiovascular system: S1 & S2 heard, RRR. No JVD, murmurs, rubs, gallops or clicks. No pedal edema. Gastrointestinal system: Abdomen is nondistended, soft and nontender. No organomegaly or masses felt. Normal bowel sounds heard. Central nervous system: Alert and oriented. No focal neurological deficits. Extremities: Trace edema Skin: No rashes, lesions or ulcers Psychiatry: Judgement and insight appear normal. Mood & affect appropriate.     Data Reviewed: I have  personally reviewed following labs and imaging studies  CBC: Recent Labs  Lab 07/26/19 0151 07/29/19 0153 07/30/19 0719  WBC 8.7 8.9 9.8  HGB 8.0* 8.1* 8.4*  HCT 28.3* 27.2* 29.3*  MCV 103.3* 101.9* 103.9*  PLT 150 111* 096*   Basic Metabolic Panel: Recent Labs  Lab 07/26/19 0151  07/28/19 0736 07/29/19 0153 07/30/19 0719 07/31/19 0238 08/01/19 0815  NA 149*   < > 157* 152* 154* 157* 158*  K 4.8   < > 5.3* 5.1 3.9 4.8 4.6  CL 107   < > 109 105 105 110 113*  CO2 35*   < > 37* 37* 39* 36* 33*  GLUCOSE 193*   < > 89 284* 88 191* 133*  BUN 92*   < > 99* 99* 97* 98* 92*  CREATININE 2.02*   < > 2.16* 2.15* 2.16* 2.18* 2.48*  CALCIUM 9.3   < > 10.2 9.5 9.7 9.8 9.6  PHOS 4.6  --   --   --   --   --   --    < > = values in this interval not displayed.   GFR: Estimated Creatinine Clearance: 19.4 mL/min (A) (by C-G formula based on SCr of 2.48 mg/dL (H)). Liver Function Tests: Recent Labs  Lab 07/26/19 0151  ALBUMIN 3.1*   No results for input(s): LIPASE, AMYLASE in the last 168 hours. No results for input(s): AMMONIA in the last 168 hours. Coagulation Profile: No results for input(s): INR, PROTIME in the last 168 hours. Cardiac Enzymes: No results for input(s): CKTOTAL, CKMB, CKMBINDEX, TROPONINI in the last 168  hours. BNP (last 3 results) No results for input(s): PROBNP in the last 8760 hours. HbA1C: No results for input(s): HGBA1C in the last 72 hours. CBG: Recent Labs  Lab 07/31/19 1604 07/31/19 1937 07/31/19 2351 08/01/19 0413 08/01/19 0755  GLUCAP 215* 193* 159* 89 118*   Lipid Profile: No results for input(s): CHOL, HDL, LDLCALC, TRIG, CHOLHDL, LDLDIRECT in the last 72 hours. Thyroid Function Tests: No results for input(s): TSH, T4TOTAL, FREET4, T3FREE, THYROIDAB in the last 72 hours. Anemia Panel: No results for input(s): VITAMINB12, FOLATE, FERRITIN, TIBC, IRON, RETICCTPCT in the last 72 hours. Sepsis Labs: No results for input(s): PROCALCITON,  LATICACIDVEN in the last 168 hours.  Recent Results (from the past 240 hour(s))  Culture, blood (routine x 2)     Status: None   Collection Time: 07/24/19  1:07 PM   Specimen: BLOOD  Result Value Ref Range Status   Specimen Description   Final    BLOOD LEFT ARM Performed at [facility] [address], [place], [place] [address]    Special Requests   Final    BOTTLES DRAWN AEROBIC AND ANAEROBIC Blood Culture results may not be optimal due to an inadequate volume of blood received in culture bottles Performed at [facility] [address], [place], [place] [address]    Culture   Final    NO GROWTH 5 DAYS Performed at [facility], [place] [address], [place], [place] [address]    Report Status 07/29/2019 FINAL  Final  Culture, blood (routine x 2)     Status: None   Collection Time: 07/24/19  1:14 PM   Specimen: BLOOD  Result Value Ref Range Status   Specimen Description   Final    BLOOD LEFT ARM Performed at [facility] [address], [place], [place] [address]    Special Requests   Final    BOTTLES DRAWN AEROBIC AND ANAEROBIC Blood Culture adequate volume Performed at [facility] [address] [place], [place] [address]    Culture   Final    NO GROWTH 5 DAYS Performed at [facility], [place] [address], [place], [place] [address]    Report Status 07/29/2019 FINAL  Final  Culture, Urine     Status: Abnormal   Collection Time: 07/27/19  3:19 PM   Specimen: Urine, Random  Result Value Ref Range Status   Specimen Description   Final    URINE, RANDOM Performed at [facility] [address] [place], [place] [address]    Special Requests   Final    NONE Performed at [facility], [place] [address], [place], [place] [address]    Culture (A)  Final    <10,000 COLONIES/mL INSIGNIFICANT GROWTH Performed at [facility] [address]  [place], [place] [address]    Report Status 07/28/2019 FINAL  Final  Culture, blood (routine x 2)     Status: None   Collection Time: 07/27/19  3:36 PM   Specimen: BLOOD  Result Value Ref Range Status   Specimen Description   Final    BLOOD LEFT ANTECUBITAL Performed at [facility] [address], [place], [place] [address]    Special Requests   Final    BOTTLES DRAWN AEROBIC AND ANAEROBIC Blood Culture adequate volume Performed at [facility] [address] [place], [place] [address]    Culture   Final  NO GROWTH 5 DAYS Performed at [facility], [place] [address], [place], [place] [address]    Report Status 08/01/2019 FINAL  Final  Culture, blood (routine x 2)     Status: None   Collection Time: 07/27/19  3:36 PM   Specimen: BLOOD  Result Value Ref Range Status   Specimen Description   Final    BLOOD LEFT ARM Performed at [facility] [address] [place], [place] [address]    Special Requests   Final    BOTTLES DRAWN AEROBIC AND ANAEROBIC Blood Culture adequate volume Performed at [facility] [address] [place],  [address]    Culture   Final    NO GROWTH 5 DAYS Performed at [facility], [place] [address] [place],  [address]    Report Status 08/01/2019 FINAL  Final         Radiology Studies: Dg Chest Port 1 View  Result Date: 07/31/2019 CLINICAL DATA:  84 year old female with shortness of breath. EXAM: PORTABLE CHEST 1 VIEW COMPARISON:  07/28/2019 and earlier. FINDINGS: Portable AP upright view at 0411 hours. Stable lung volumes and mediastinal contours. Continued bilateral pulmonary interstitial opacity although ventilation has improved since 07/27/2019. No superimposed pneumothorax or consolidation. No pleural effusion is evident. Paucity of bowel gas in the upper abdomen. Visualized tracheal air column is within normal limits. No acute osseous abnormality  identified. IMPRESSION: 1. Improved bilateral ventilation since 07/27/2019 with continued widespread interstitial opacity. 2. No new cardiopulmonary abnormality. Electronically Signed   By: Genevie Ann M.D.   On: 07/31/2019 07:43        Scheduled Meds: . amLODipine  10 mg Oral Daily  . atorvastatin  40 mg Oral QHS  . chlorhexidine  15 mL Mouth Rinse BID  . Chlorhexidine Gluconate Cloth  6 each Topical Daily  . docusate sodium  200 mg Oral QHS  . feeding supplement (ENSURE ENLIVE)  237 mL Oral TID BM  . feeding supplement (PRO-STAT SUGAR FREE 64)  30 mL Oral BID  . fenofibrate  54 mg Oral Daily  . insulin aspart  0-20 Units Subcutaneous Q4H  . insulin glargine  30 Units Subcutaneous QHS  . levothyroxine  112 mcg Oral QAC breakfast  . mouth rinse  15 mL Mouth Rinse q12n4p  . methylPREDNISolone (SOLU-MEDROL) injection  40 mg Intravenous Daily  . multivitamin with minerals  1 tablet Oral Daily  . nystatin  5 mL Oral QID  . nystatin cream   Topical BID  . pantoprazole  40 mg Oral BID AC  . sertraline  100 mg Oral QPM  . sodium chloride flush  3 mL Intravenous Q12H   Continuous Infusions: . sodium chloride       LOS: 13 days     Georgette Shell, MD [facility]  If 7PM-7AM, please contact night-coverage [email or web address] Password [facility] 08/01/2019, 3:28 PM

## 2019-08-01 NOTE — Progress Notes (Signed)
NAME:  [name], MRN:  [id number], DOB:  October 25, 1934, LOS: 27 ADMISSION DATE:  07/19/2019, CONSULTATION DATE:  11/12 REFERRING MD:  Roel Cluck, CHIEF COMPLAINT:  Recurrent respiratory failure   Brief History    84 y/o female was admitted on 11/11 with recurrent hypoxemic/hypercarbic respiratory failure requiring Bipap support in setting of diffuse pulmonary infiltrates of unknown origin  She underwent elective cardioversion for chronic AF on 10/19 and was discharged on 2 L O2.  She was admitted 10/26-11/10 for acute hypoxic respiratory failure with bilateral infiltrates, felt to be pneumonitis of unclear etiology.  Serology negative.  Discharged on 6 L oxygen and prednisone taper  Past Medical History  CKD Stage 3, Anemia of chronic disease,Atrial fibrillation on Elaquis, s/p cardioversion,OSA on CPAP,HFpEF ,HLD,Type II DM HTN,Hypothyroidism,Chronic ITP,PE 2010  Significant Hospital Events   Stopped eliquis 11/13 due to concern with hemoptysis report and ? alv hem on cxr/ ct  Consults:  11/12 >> PCCM  Procedures:   Echo 07/05/19 (p onset of sob/ infiltrates: Grade I diastolic dysfunction with Mild LAE, nl RA  Significant Diagnostic Tests:   10/27 CT Chest >> Widespread ground-glass opacities, small bilat pleural effusions   Micro Data:  11/10 >> Sars-CoV2 neg 11/10 >> MRSA PCR pos 11/11  Urine strep neg 11/11  Urine legionella neg   PCT  11/15  Neg    Antimicrobials:  11/11 Vanco  >>11/15 11/11 Cefepime  >>11/15 11/11 Azithromycin >> 11/15  Interim history/subjective:   Gradually improving, down to 35% / 20 L high flow Afebrile    Objective   Blood pressure (!) 152/41, pulse 68, temperature 98 F (36.7 C), temperature source Oral, resp. rate 20, height '5\' 1"'$  (1.549 m), weight 100.9 kg, SpO2 93 %.    FiO2 (%):  [35 %-40 %] 35 %   Intake/Output Summary (Last 24 hours) at 08/01/2019 1138 Last data filed at 07/31/2019 2118 Gross per 24 hour  Intake 3 ml   Output -  Net 3 ml  sats 94% on 50% Filed Weights   07/30/19 0500 07/31/19 0900 08/01/19 0500  Weight: 105.9 kg 102 kg 100.9 kg    Examination: General appearance: Elderly woman, no distress, pleasant affect Eyes: Mild pallor no icterus HENT: NCAT; oropharynx, mucous membranes dry Neck: Trachea midline; no JVD Lungs: decreased breath sounds bilateral, no crackles, no wheeze, shallow breaths CV: RRR, S1, S2, no MRGs  Abdomen: Soft, non-tender; non-distended, BS present  Extremities: Dependent edema upper and lower extremities Skin: No rash Psych: Appropriate affect Neuro: Lethargic after meds, otherwise alert and interactive, no asterixis  Chest x-ray 11/23 personally reviewed -improvement in bilateral infiltrates  Assessment & Plan:   Acute on chronic hypoxic respiratory failure Diffuse pulmonary infiltrates of unclear etiology PAF (cardioverted and in NSR since Jun 26 2019) CRI (stage III) Mild systolic (EF 76-22%) and grade I diastolic HF Obesity   OSA H/o ITP ( ROMIPLOSTIM & FERAHEME) Chronic anemia  DM DNR  Pulmonary problem list:  Recurrent acute on chronic hypoxemic  respiratory failure in setting of diffuse patchy infiltrates.  Drug  - no record of any of the usual suspects (chemo, amiodarone/nitrofurantoin)  Serology negative for Connective tissue dz/ BOOP > esr 120 prior to steroids  Hemorrhage on eliquis > d/c'd 11/13  Moderate pulmonary hypertension on echo Not a  good candidate for bronchoscopy    PLAN: Hypoxia has improved, will attempt transitioning to 8 to 10 L nasal cannula Did not tolerate CPAP , continue BiPAP for now and  reattempt once hypoxia has improved Decrease Solu-Medrol 40 every 24 -can transition to oral prednisone 40 mg eventually and will need long taper over 4 to 6 weeks   Stool occult blood positive noted , hemoglobin stable, off Eliquis Hypernatremia Per primary -hold Lasix  Summary Hypoxia is slowly but definitely improving.   Would like to see how she rehabs.  Unfortunately this illness has really got her down and she seems to lack of willpower to participate.  Palliative care assisting with that discussion with family.  I feel that if we can switch her from high flow, that will help with dispo and perhaps a different venue   Kara Mead MD. FCCP. [facility]  If no response to pager , please call [phone]     08/01/2019 11:38 AM

## 2019-08-02 LAB — CBC WITH DIFFERENTIAL/PLATELET
Abs Immature Granulocytes: 0.02 10*3/uL (ref 0.00–0.07)
Basophils Absolute: 0 10*3/uL (ref 0.0–0.1)
Basophils Relative: 0 %
Eosinophils Absolute: 0.1 10*3/uL (ref 0.0–0.5)
Eosinophils Relative: 2 %
HCT: 25.9 % — ABNORMAL LOW (ref 36.0–46.0)
Hemoglobin: 7.1 g/dL — ABNORMAL LOW (ref 12.0–15.0)
Immature Granulocytes: 0 %
Lymphocytes Relative: 4 %
Lymphs Abs: 0.2 10*3/uL — ABNORMAL LOW (ref 0.7–4.0)
MCH: 29.5 pg (ref 26.0–34.0)
MCHC: 27.4 g/dL — ABNORMAL LOW (ref 30.0–36.0)
MCV: 107.5 fL — ABNORMAL HIGH (ref 80.0–100.0)
Monocytes Absolute: 0.3 10*3/uL (ref 0.1–1.0)
Monocytes Relative: 6 %
Neutro Abs: 4.4 10*3/uL (ref 1.7–7.7)
Neutrophils Relative %: 88 %
Platelets: 83 10*3/uL — ABNORMAL LOW (ref 150–400)
RBC: 2.41 MIL/uL — ABNORMAL LOW (ref 3.87–5.11)
RDW: 23.1 % — ABNORMAL HIGH (ref 11.5–15.5)
WBC: 5 10*3/uL (ref 4.0–10.5)
nRBC: 0 % (ref 0.0–0.2)

## 2019-08-02 LAB — GLUCOSE, CAPILLARY
Glucose-Capillary: 115 mg/dL — ABNORMAL HIGH (ref 70–99)
Glucose-Capillary: 116 mg/dL — ABNORMAL HIGH (ref 70–99)
Glucose-Capillary: 175 mg/dL — ABNORMAL HIGH (ref 70–99)
Glucose-Capillary: 191 mg/dL — ABNORMAL HIGH (ref 70–99)
Glucose-Capillary: 252 mg/dL — ABNORMAL HIGH (ref 70–99)
Glucose-Capillary: 218 mg/dL — ABNORMAL HIGH (ref 70–99)

## 2019-08-02 LAB — BASIC METABOLIC PANEL
Anion gap: 9 (ref 5–15)
BUN: 99 mg/dL — ABNORMAL HIGH (ref 8–23)
CO2: 35 mmol/L — ABNORMAL HIGH (ref 22–32)
Calcium: 9.1 mg/dL (ref 8.9–10.3)
Chloride: 114 mmol/L — ABNORMAL HIGH (ref 98–111)
Creatinine, Ser: 2.44 mg/dL — ABNORMAL HIGH (ref 0.44–1.00)
GFR calc Af Amer: 21 mL/min — ABNORMAL LOW (ref >60)
GFR calc non Af Amer: 18 mL/min — ABNORMAL LOW (ref >60)
Glucose, Bld: 137 mg/dL — ABNORMAL HIGH (ref 70–99)
Potassium: 4.2 mmol/L (ref 3.5–5.1)
Sodium: 158 mmol/L — ABNORMAL HIGH (ref 135–145)

## 2019-08-02 LAB — PREPARE RBC (CROSSMATCH)

## 2019-08-02 MED ORDER — DARBEPOETIN ALFA 300 MCG/0.6ML IJ SOSY
300.0000 ug | PREFILLED_SYRINGE | Freq: Once | INTRAMUSCULAR | Status: AC
  Administered 2019-08-02: 300 ug via SUBCUTANEOUS
  Filled 2019-08-02: qty 0.6

## 2019-08-02 MED ORDER — SODIUM CHLORIDE 0.9% IV SOLUTION: Freq: Once | INTRAVENOUS | Status: DC

## 2019-08-02 NOTE — Progress Notes (Signed)
Pt. asked to be taken off BiPAP due to c/o dry mouth/pain mngt., asked to place on hfnc([place])@4  lpm, RN made aware, tolerating well.

## 2019-08-02 NOTE — Progress Notes (Signed)
Occupational Therapy Treatment Patient Details Name: [name] MRN: [id number] DOB: Apr 19, 1935 Today's Date: 08/02/2019    History of present illness Pt is 84 y/o F with PMH: CKD stage 3, Anemia, Afib (on Elaquis), OSA on CPAP. Was d/c'ed to SNF from [facility] for respiratory failure on 07/18/19 (several recent admissions). Pt re-presented to acute setting with increasing oxygen requirement  on 11/11, required BiPaPand found to have worsening bilateral lung infiltrates on CXR, pneumonitis, b/l PNA, ARDS.   OT comments  Limited tx due to pain.  Pain meds were ordered and unavailable. Pt perseverating on clarifying what we were going to do and having a popsicle.  Performed AROM; pt may be able to do level one theraband. She really needs to sit up and move, so will continue to encourage this and make sure she is premedicated.      Follow Up Recommendations  SNF    Equipment Recommendations  (defer to next venue)    Recommendations for Other Services      Precautions / Restrictions Precautions Precautions: Fall Restrictions Weight Bearing Restrictions: No       Mobility Bed Mobility     Rolling: Total assist;+2 for physical assistance         General bed mobility comments: resisting rolling due to pain.  Rolled to place chux under her to help with mobility. Pt adamently refused to sit EOB due to pain; meds on order and unavailable.  Also refused rolling to side to reduce pressure on buttocks  Transfers                      Balance                                           ADL either performed or assessed with clinical judgement   ADL                                         General ADL Comments: pt was holding and drinking from her cup.  Her daughter was feeding her, to try to get nutrition in, as pt really didn't want to eat     Vision       Perception     Praxis      Cognition Arousal/Alertness: Awake/alert                                      General Comments: decreased memory; asked for same things over and over.  Resisting movement due to pain        Exercises General Exercises - Upper Extremity Shoulder Flexion: AROM;Both;5 reps;Supine Elbow Flexion: AROM;Both;Supine;5 reps Digit Composite Flexion: AROM;10 reps;Squeeze ball(ball in R hand)   Shoulder Instructions       General Comments pt now on 6 liters 02; VSS.      Pertinent Vitals/ Pain       Pain Assessment: Faces Faces Pain Scale: Hurts worst Pain Location: buttocks with movement Pain Descriptors / Indicators: Grimacing;Moaning Pain Intervention(s): Limited activity within patient's tolerance;Monitored during session;Repositioned(RN waiting for meds to come up)  Home Living  Prior Functioning/Environment              Frequency  Min 2X/week        Progress Toward Goals  OT Goals(current goals can now be found in the care plan section)  Progress towards OT goals: Progressing toward goals(slowly; self limited today due to pain)     Plan      Co-evaluation                 AM-PAC OT "6 Clicks" Daily Activity     Outcome Measure   Help from another person eating meals?: A Lot Help from another person taking care of personal grooming?: A Little Help from another person toileting, which includes using toliet, bedpan, or urinal?: Total Help from another person bathing (including washing, rinsing, drying)?: Total Help from another person to put on and taking off regular upper body clothing?: A Lot Help from another person to put on and taking off regular lower body clothing?: Total 6 Click Score: 10    End of Session    OT Visit Diagnosis: Muscle weakness (generalized) (M62.81)   Activity Tolerance Patient limited by fatigue;No increased pain   Patient Left in bed;with call bell/phone within reach;with bed alarm set;with  family/visitor present   Nurse Communication          Time: 4403-4742 OT Time Calculation (min): 27 min  Charges: OT General Charges $OT Visit: 1 Visit OT Treatments $Therapeutic Activity: 8-22 mins $Therapeutic Exercise: 8-22 mins  Lesle Chris, OTR/L Acute Rehabilitation Services [phone] [facility] pager [phone] office 08/02/2019   [place] 08/02/2019, 3:26 PM

## 2019-08-02 NOTE — Progress Notes (Signed)
NAME:  [name], MRN:  [id number], DOB:  [birth year], LOS: 33 ADMISSION DATE:  07/19/2019, CONSULTATION DATE:  11/12 REFERRING MD:  Roel Cluck, CHIEF COMPLAINT:  Recurrent respiratory failure   Brief History    84 y/o female was admitted on 11/11 with recurrent hypoxemic/hypercarbic respiratory failure requiring Bipap support in setting of diffuse pulmonary infiltrates of unknown origin  She underwent elective cardioversion for chronic AF on 10/19 and was discharged on 2 L O2.  She was admitted 10/26-11/10 for acute hypoxic respiratory failure with bilateral infiltrates, felt to be pneumonitis of unclear etiology.  Serology negative.  Discharged on 6 L oxygen and prednisone taper  Past Medical History  CKD Stage 3, Anemia of chronic disease,Atrial fibrillation on Elaquis, s/p cardioversion,OSA on CPAP,HFpEF ,HLD,Type II DM HTN,Hypothyroidism,Chronic ITP,PE 2010  Significant Hospital Events   Stopped eliquis 11/13 due to concern with hemoptysis report and ? alv hem on cxr/ ct  Consults:  11/12 >> PCCM  Procedures:   Echo 07/05/19 (p onset of sob/ infiltrates: Grade I diastolic dysfunction with Mild LAE, nl RA  Significant Diagnostic Tests:   10/27 CT Chest >> Widespread ground-glass opacities, small bilat pleural effusions   Micro Data:  11/10 >> Sars-CoV2 neg 11/10 >> MRSA PCR pos 11/11  Urine strep neg 11/11  Urine legionella neg   PCT  11/15  Neg    Antimicrobials:  11/11 Vanco  >>11/15 11/11 Cefepime  >>11/15 11/11 Azithromycin >> 11/15  Interim history/subjective:   Down to 6 L nasal cannula Awake and interactive Afebrile    Objective   Blood pressure (!) 121/40, pulse 61, temperature 97.6 F (36.4 C), temperature source Oral, resp. rate 19, height _0  (1.549 m), weight 99.2 kg, SpO2 99 %.        Intake/Output Summary (Last 24 hours) at 08/02/2019 1024 Last data filed at 08/01/2019 2129 Gross per 24 hour  Intake 3 ml  Output -  Net 3 ml  sats  94% on 50% Filed Weights   07/31/19 0900 08/01/19 0500 08/02/19 0500  Weight: 102 kg 100.9 kg 99.2 kg    Examination: General appearance: Elderly woman, no distress, pleasant affect Eyes: Mild pallor no icterus HENT: NCAT; oropharynx, mucous membranes dry Neck: Trachea midline; no JVD Lungs: decreased breath sounds bilateral, no crackles, no wheeze, shallow breaths CV: RRR, S1, S2, no MRGs  Abdomen: Soft, non-tender; non-distended, BS present  Extremities: Dependent edema upper and lower extremities Skin: No rash Psych: Appropriate affect Neuro:  alert and interactive, no asterixis  Chest x-ray 11/23 personally reviewed -improvement in bilateral infiltrates  Assessment & Plan:   Acute on chronic hypoxic respiratory failure Diffuse pulmonary infiltrates of unclear etiology PAF (cardioverted and in NSR since Jun 26 2019) CRI (stage III) Mild systolic (EF 37-16%) and grade I diastolic HF Obesity   OSA H/o ITP ( ROMIPLOSTIM & FERAHEME) Chronic anemia  DM DNR  Pulmonary problem list:  Recurrent acute on chronic hypoxemic  respiratory failure in setting of diffuse patchy infiltrates.  Drug  - no record of any of the usual suspects (chemo, amiodarone/nitrofurantoin)  Serology negative for Connective tissue dz/ BOOP > esr 120 prior to steroids  Hemorrhage on eliquis > d/c'd 11/13  Moderate pulmonary hypertension on echo Not a  good candidate for bronchoscopy    PLAN: Hypoxia has improved, she has successfully transitioned to 4-6 L nasal cannula Seems like she was on BiPAP last night, will reattempt CPAP tonight Decrease Solu-Medrol 40 every 24 -can transition to oral  prednisone 40 mg eventually and will need long taper over 4 to 6 weeks   Stool occult blood positive noted , hemoglobin stable, off Eliquis Hypernatremia Per primary -hold Lasix Anemia/thrombocytopenia-Per hematology  Summary Hypoxia has improved remarkably as predicted.  She has transition from high flow  and hopefully will tolerate CPAP in which case she can be transferred to the floor What remains to be seen is whether she will participate in rehab and improve her oral intake D/w daughter Lucina Mellow MD. [facility]  If no response to pager , please call [phone]     08/02/2019 10:24 AM

## 2019-08-02 NOTE — Progress Notes (Signed)
PROGRESS NOTE    [name]  UEK:[id number] DOB: [birth year] DOA: 07/19/2019 PCP: Javier Glazier, MD  Brief Narrative:84 y.o.femalewith medical history significant of chronic ITP, CKD stage3, anemia of chronic disease, A.fib on Eliquis,sleep apnea on CPAP history of CHF HLD history of pulmonary embolism in 2010 diabetes type 2, HTN,hypothyroidism, pressure ulcer Presented withworsening shortness of breathfromSNF. Multiple recent admissions in October, most recentlyshe was discharged to SNF 1 day ago from [facility] where she was treated for acute respiratory failure and thought to be due to pneumonitis and was sent home with tapering dose of steroid. She was having increased oxygen requirement, initially managed with nonrebreather. BiPAP was startedlaterdue to worsening respiratory status. Chest x-ray with worsening of bilateral infiltrate, broader differential which includes pneumonitis, bilateral pneumonia, ARDS, pulmonary vascular congestion. She was started on broad-spectrum antibiotics,Lasix and steroids. Antibiotics were discontinued after having normal procalcitonin multiple times. Continue steroid and give Lasix as needed. Currently appears dry. PCCM is on board and does not think that there is much to offer for her.  Assessment & Plan:   Principal Problem:   Acute respiratory failure with hypoxia (HCC) Active Problems:   Hypothyroidism   HYPERCHOLESTEROLEMIA   OBESITY   Essential hypertension   Chronic kidney disease (CKD) stage G4/A1, severely decreased glomerular filtration rate (GFR) between 15-29 mL/min/1.73 square meter and albuminuria creatinine ratio less than 30 mg/g (HCC)   Chronic kidney disease   Chronic ITP (idiopathic thrombocytopenia) (HCC)   Normocytic anemia   Atypical atrial flutter (HCC)   HCAP (healthcare-associated pneumonia)   Pneumonitis   Shortness of breath   Palliative care by specialist   Goals of care, counseling/discussion    #1 acute on chronic hypoxic and hypercapnic respiratory failure of unclear etiology-prior to admission she was not on oxygen.  Her oxygen requirement is decreased to 4 to 6 L to keep her saturation above 88%.  Plan is for slow taper of steroids.  PT has seen her and is recommending skilled nursing facility when she is ready to be discharged.  Family at this point is wanting to keep doing what we are doing to discharge her to SNF.  I did tell them she is not out of the woods she has a lot of chronic issues going on which includes anemia thrombocytopenia and AKI CKD and generalized deconditioning and needing aggressive physical therapy as she has been in bed for a month.  #2 hypothermia resolved  #3 hypertension  Blood pressure 141/40 continue amlodipine.  #4 AKI on CKD stage III-creatinine is stable today at 2.4.  Foley catheter was placed yesterday for urinary retention.  Hopefully this will improve her renal function a bit.  Encourage p.o. fluids intake.    #5 diastolic heart failure with mild systolic heart failure-not ondiuretics  #6 chronic ITP platelet counts dropping.  Dr. Marin Olp has seen her today and has recommended Nplate.  Platelet count today is 83.  #7 type 2 diabetesbloodsugarimproved. Blood sugar1 16-1 91.  Continue Lantus 30 units at bedtime  #8 atrial fibrillation chronic status post cardioversion 06/26/2019 now in sinus rhythm. Eliquis discontinued due to recent hemoptysis 11/13/2020and heme positive stools. She is not a candidate for further GI work-up.  #9 hypothyroidism continue Synthroid.  #10 obstructive sleep apnea on BiPAP  #11 hypernatremiaunfortunately her hyponatremia has been getting worse off of Lasix. She has only received Lasix twice last week. Her p.o. intake has been very decreased.  #12 depression continue Zoloft  #13 urinary retention Foley placed  overnight.  UA was negative.   Pressure Injury 07/08/19 Buttocks  Right;Left;Medial;Lower Stage II -  Partial thickness loss of dermis presenting as a shallow open ulcer with a red, pink wound bed without slough. Red, nonblanchable, (Active)  07/08/19 1000  Location: Buttocks  Location Orientation: Right;Left;Medial;Lower  Staging: Stage II -  Partial thickness loss of dermis presenting as a shallow open ulcer with a red, pink wound bed without slough.  Wound Description (Comments): Red, nonblanchable,  Present on Admission: Yes      Nutrition Problem: Increased nutrient needs Etiology: chronic illness(CHF)     Signs/Symptoms: estimated needs    Interventions: Glucerna shake, Prostat, MVI  Estimated body mass index is 41.32 kg/m as calculated from the following:   Height as of this encounter: 5\' 1"  (1.549 m).   Weight as of this encounter: 99.2 kg. DVT prophylaxis:SCDs due to thrombocytopenia  code Status:DO NOT RESUSCITATE  family Communication:Discussed with daughter  [name]. disposition Plan:Pending clinical improvement  Consultants:  PCCM oncology and palliative care  Procedures:None Antimicrobials:None  Subjective: More awake answering questions appropriately  Feels breathing feels better Had urinary retention foley placed  Objective: Vitals:   08/02/19 0600 08/02/19 0800 08/02/19 1000 08/02/19 1200  BP: (!) 151/81 (!) 129/34 (!) 121/40 (!) 141/40  Pulse: 64 60 61 (!) 59  Resp: (!) 21 16 19 19   Temp:      TempSrc:      SpO2: 100% 100% 99% 99%  Weight:      Height:        Intake/Output Summary (Last 24 hours) at 08/02/2019 1243 Last data filed at 08/01/2019 2129 Gross per 24 hour  Intake 3 ml  Output -  Net 3 ml   Filed Weights   07/31/19 0900 08/01/19 0500 08/02/19 0500  Weight: 102 kg 100.9 kg 99.2 kg    Examination:  General exam: Appears calm and comfortable  Respiratory system: decreased breath sounds at bases to auscultation. Respiratory effort normal. Cardiovascular system: S1 & S2 heard,  RRR. No JVD, murmurs, rubs, gallops or clicks. No pedal edema. Gastrointestinal system: Abdomen is nondistended, soft and nontender. No organomegaly or masses felt. Normal bowel sounds heard. Central nervous system: Alert and oriented. No focal neurological deficits. Extremities: Symmetric 5 x 5 power. Skin: No rashes, lesions or ulcers Psychiatry: Judgement and insight appear normal. Mood & affect appropriate.     Data Reviewed: I have personally reviewed following labs and imaging studies  CBC: Recent Labs  Lab 07/29/19 0153 07/30/19 0719 08/02/19 0656  WBC 8.9 9.8 5.0  NEUTROABS  --   --  4.4  HGB 8.1* 8.4* 7.1*  HCT 27.2* 29.3* 25.9*  MCV 101.9* 103.9* 107.5*  PLT 111* 112* 83*   Basic Metabolic Panel: Recent Labs  Lab 07/29/19 0153 07/30/19 0719 07/31/19 0238 08/01/19 0815 08/02/19 0232  NA 152* 154* 157* 158* 158*  K 5.1 3.9 4.8 4.6 4.2  CL 105 105 110 113* 114*  CO2 37* 39* 36* 33* 35*  GLUCOSE 284* 88 191* 133* 137*  BUN 99* 97* 98* 92* 99*  CREATININE 2.15* 2.16* 2.18* 2.48* 2.44*  CALCIUM 9.5 9.7 9.8 9.6 9.1   GFR: Estimated Creatinine Clearance: 19.5 mL/min (A) (by C-G formula based on SCr of 2.44 mg/dL (H)). Liver Function Tests: No results for input(s): AST, ALT, ALKPHOS, BILITOT, PROT, ALBUMIN in the last 168 hours. No results for input(s): LIPASE, AMYLASE in the last 168 hours. No results for input(s): AMMONIA in the last 168 hours. Coagulation  Profile: No results for input(s): INR, PROTIME in the last 168 hours. Cardiac Enzymes: No results for input(s): CKTOTAL, CKMB, CKMBINDEX, TROPONINI in the last 168 hours. BNP (last 3 results) No results for input(s): PROBNP in the last 8760 hours. HbA1C: No results for input(s): HGBA1C in the last 72 hours. CBG: Recent Labs  Lab 08/01/19 2008 08/01/19 2357 08/02/19 0449 08/02/19 0817 08/02/19 1104  GLUCAP 129* 115* 116* 175* 191*   Lipid Profile: No results for input(s): CHOL, HDL, LDLCALC, TRIG,  CHOLHDL, LDLDIRECT in the last 72 hours. Thyroid Function Tests: No results for input(s): TSH, T4TOTAL, FREET4, T3FREE, THYROIDAB in the last 72 hours. Anemia Panel: No results for input(s): VITAMINB12, FOLATE, FERRITIN, TIBC, IRON, RETICCTPCT in the last 72 hours. Sepsis Labs: No results for input(s): PROCALCITON, LATICACIDVEN in the last 168 hours.  Recent Results (from the past 240 hour(s))  Culture, blood (routine x 2)     Status: None   Collection Time: 07/24/19  1:07 PM   Specimen: BLOOD  Result Value Ref Range Status   Specimen Description   Final    BLOOD LEFT ARM Performed at [facility] [address], [place], [place] [address]    Special Requests   Final    BOTTLES DRAWN AEROBIC AND ANAEROBIC Blood Culture results may not be optimal due to an inadequate volume of blood received in culture bottles Performed at [facility] [address], [place], [place] [address]    Culture   Final    NO GROWTH 5 DAYS Performed at [facility], [place] [address] [place], [place] [address]    Report Status 07/29/2019 FINAL  Final  Culture, blood (routine x 2)     Status: None   Collection Time: 07/24/19  1:14 PM   Specimen: BLOOD  Result Value Ref Range Status   Specimen Description   Final    BLOOD LEFT ARM Performed at [facility] [address], [place], [place] [address]    Special Requests   Final    BOTTLES DRAWN AEROBIC AND ANAEROBIC Blood Culture adequate volume Performed at [facility] [address] [place], [place] [address]    Culture   Final    NO GROWTH 5 DAYS Performed at [facility], [place] [address] [place], [place] [address]    Report Status 07/29/2019 FINAL  Final  Culture, Urine     Status: Abnormal   Collection Time: 07/27/19  3:19 PM   Specimen: Urine, Random  Result Value Ref Range Status   Specimen Description   Final    URINE, RANDOM Performed at [facility] [address] [place], [place] [address]    Special Requests   Final    NONE Performed at [facility], [place] [address], [place], [place] [address]    Culture (A)  Final    <10,000 COLONIES/mL INSIGNIFICANT GROWTH Performed at [facility] [address], [place], [place] [address]    Report Status 07/28/2019 FINAL  Final  Culture, blood (routine x 2)     Status: None   Collection Time: 07/27/19  3:36 PM   Specimen: BLOOD  Result Value Ref Range Status   Specimen Description   Final    BLOOD LEFT ANTECUBITAL Performed at [facility] [address] [place],  [address]    Special Requests   Final    BOTTLES DRAWN AEROBIC AND ANAEROBIC  Blood Culture adequate volume Performed at [facility] [address], [place], [place] [address]    Culture   Final    NO GROWTH 5 DAYS Performed at [facility], [place] [address] [place], [place] [address]    Report Status 08/01/2019 FINAL  Final  Culture, blood (routine x 2)     Status: None   Collection Time: 07/27/19  3:36 PM   Specimen: BLOOD  Result Value Ref Range Status   Specimen Description   Final    BLOOD LEFT ARM Performed at [facility] [address], [place], [place] [address]    Special Requests   Final    BOTTLES DRAWN AEROBIC AND ANAEROBIC Blood Culture adequate volume Performed at [facility] [address], [place], [place] [address]    Culture   Final    NO GROWTH 5 DAYS Performed at [facility], [place] [address], [place], [place] [address]    Report Status 08/01/2019 FINAL  Final         Radiology Studies: No results found.      Scheduled Meds: . amLODipine  2.5 mg Oral Daily  . atorvastatin  40 mg Oral QHS  . chlorhexidine  15 mL Mouth Rinse BID  . Chlorhexidine Gluconate Cloth  6 each Topical Daily  . docusate sodium  200 mg Oral QHS  . feeding  supplement (ENSURE ENLIVE)  237 mL Oral TID BM  . feeding supplement (PRO-STAT SUGAR FREE 64)  30 mL Oral BID  . insulin aspart  0-20 Units Subcutaneous Q4H  . insulin glargine  30 Units Subcutaneous QHS  . levothyroxine  112 mcg Oral QAC breakfast  . mouth rinse  15 mL Mouth Rinse q12n4p  . methylPREDNISolone (SOLU-MEDROL) injection  40 mg Intravenous Daily  . nystatin cream   Topical BID  . sertraline  100 mg Oral QPM  . sodium chloride flush  3 mL Intravenous Q12H   Continuous Infusions: . sodium chloride       LOS: 14 days     Georgette Shell, MD [facility]  If 7PM-7AM, please contact night-coverage [email or web address] Password [facility] 08/02/2019, 12:43 PM

## 2019-08-02 NOTE — Progress Notes (Signed)
[name] is doing okay.  Seems like her oxygen level is better.  She is not on the BiPAP.  She seems to be breathing comfortably.  Her platelet count has been trending downward.  She had a platelet count of 112,000 yesterday.  We did give her a dose of Nplate.  Her platelet count today is 83,000.  We does have to let the Nplate work.  Her hemoglobin is only 7.1.  I will give her a dose of Aranesp.  I think that she probably is going to need transfusion given her age and her renal insufficiency.  It is still not clear as to what the discharge plan is for her.  I do not see any bleeding.  She is not on any anticoagulation right now.  I think that she is in a sinus rhythm.  I realize that she does have quite a few health issues.  Hopefully, she will continue to improve.  [name], MD  Colossians 2:7

## 2019-08-03 LAB — TYPE AND SCREEN
ABO/RH(D): A NEG
Antibody Screen: NEGATIVE
Unit division: 0

## 2019-08-03 LAB — BPAM RBC
Blood Product Expiration Date: 202012102359
ISSUE DATE / TIME: 202011251722
Unit Type and Rh: 600

## 2019-08-03 LAB — GLUCOSE, CAPILLARY
Glucose-Capillary: 105 mg/dL — ABNORMAL HIGH (ref 70–99)
Glucose-Capillary: 116 mg/dL — ABNORMAL HIGH (ref 70–99)
Glucose-Capillary: 134 mg/dL — ABNORMAL HIGH (ref 70–99)
Glucose-Capillary: 135 mg/dL — ABNORMAL HIGH (ref 70–99)
Glucose-Capillary: 135 mg/dL — ABNORMAL HIGH (ref 70–99)
Glucose-Capillary: 252 mg/dL — ABNORMAL HIGH (ref 70–99)
Glucose-Capillary: 94 mg/dL (ref 70–99)

## 2019-08-03 LAB — BASIC METABOLIC PANEL
Anion gap: 8 (ref 5–15)
BUN: 87 mg/dL — ABNORMAL HIGH (ref 8–23)
CO2: 36 mmol/L — ABNORMAL HIGH (ref 22–32)
Calcium: 8.7 mg/dL — ABNORMAL LOW (ref 8.9–10.3)
Chloride: 111 mmol/L (ref 98–111)
Creatinine, Ser: 2.26 mg/dL — ABNORMAL HIGH (ref 0.44–1.00)
GFR calc Af Amer: 23 mL/min — ABNORMAL LOW (ref >60)
GFR calc non Af Amer: 20 mL/min — ABNORMAL LOW (ref >60)
Glucose, Bld: 98 mg/dL (ref 70–99)
Potassium: 3.8 mmol/L (ref 3.5–5.1)
Sodium: 155 mmol/L — ABNORMAL HIGH (ref 135–145)

## 2019-08-03 NOTE — Progress Notes (Signed)
Daily Progress Note   Patient Name: [name]       Date: 08/03/2019 DOB: 1935-07-20  Age: 84 y.o. MRN#: [id number] Attending Physician: Georgette Shell, MD Primary Care Physician: Javier Glazier, MD Admit Date: 07/19/2019  Reason for Consultation/Follow-up: Establishing goals of care  Subjective: Awake alert sitting up in bed, daughters not at bedside at time of this note, in no distress, now transferred out of stepdown unit, PT notes reviewed, have discussed with TRH MD regularly about the patient's current condition, for the past few days.   O2 requirements noted, PCCM impression and recommendations noted.   See below.     Length of Stay: 15  Current Medications: Scheduled Meds:  . sodium chloride   Intravenous Once  . amLODipine  2.5 mg Oral Daily  . atorvastatin  40 mg Oral QHS  . chlorhexidine  15 mL Mouth Rinse BID  . Chlorhexidine Gluconate Cloth  6 each Topical Daily  . docusate sodium  200 mg Oral QHS  . feeding supplement (ENSURE ENLIVE)  237 mL Oral TID BM  . feeding supplement (PRO-STAT SUGAR FREE 64)  30 mL Oral BID  . insulin aspart  0-20 Units Subcutaneous Q4H  . insulin glargine  30 Units Subcutaneous QHS  . levothyroxine  112 mcg Oral QAC breakfast  . mouth rinse  15 mL Mouth Rinse q12n4p  . methylPREDNISolone (SOLU-MEDROL) injection  40 mg Intravenous Daily  . nystatin cream   Topical BID  . sertraline  100 mg Oral QPM  . sodium chloride flush  3 mL Intravenous Q12H    Continuous Infusions: . sodium chloride      PRN Meds: sodium chloride, acetaminophen **OR** acetaminophen, albuterol, bisacodyl, hydrALAZINE, HYDROcodone-acetaminophen, hyoscyamine, morphine injection, ondansetron **OR** ondansetron (ZOFRAN) IV, oxyCODONE, sodium chloride,  sodium chloride flush  Physical Exam          Remains awake and interactive She has dependent edema Abdomen is also distended Diminished breath sounds No distress  Vital Signs: BP (!) 133/45 (BP Location: Left Arm)   Pulse 62   Temp 97.7 F (36.5 C) (Oral)   Resp 18   Ht 5\' 1"  (1.549 m)   Wt 98.8 kg   LMP  (LMP Unknown)   SpO2 99%   BMI 41.16 kg/m  SpO2: SpO2: 99 % O2  Device: O2 Device: Room Air O2 Flow Rate: O2 Flow Rate (L/min): 5 L/min  Intake/output summary:   Intake/Output Summary (Last 24 hours) at 08/03/2019 1030 Last data filed at 08/03/2019 0601 Gross per 24 hour  Intake 512 ml  Output 2075 ml  Net -1563 ml   LBM: Last BM Date: 08/02/19(Per pt) Baseline Weight: Weight: 105.7 kg Most recent weight: Weight: 98.8 kg       Palliative Assessment/Data:      Patient Active Problem List   Diagnosis Date Noted  . Shortness of breath   . Palliative care by specialist   . Goals of care, counseling/discussion   . Pneumonitis 07/14/2019  . Pressure injury of skin 07/10/2019  . HCAP (healthcare-associated pneumonia)   . Acute hypoxemic respiratory failure ([place]) 07/03/2019  . Atypical atrial flutter ([place])   . Normocytic anemia 05/18/2017  . Chronic ITP (idiopathic thrombocytopenia) (HCC) 01/25/2017  . Chronic kidney disease (CKD) stage G4/A1, severely decreased glomerular filtration rate (GFR) between 15-29 mL/min/1.73 square meter and albuminuria creatinine ratio less than 30 mg/g (HCC) 09/04/2015  . Chronic kidney disease 09/04/2015  . Acute respiratory failure with hypoxia ([place]) 08/24/2015  . GOUT 06/23/2010  . DERMATITIS 01/16/2010  . BACK PAIN 06/13/2009  . MYALGIA 06/13/2009  . Anxiety state 12/13/2008  . LEG EDEMA, CHRONIC 11/08/2008  . Sleep apnea 10/26/2008  . Hypothyroidism 09/27/2008  . HYPERCHOLESTEROLEMIA 09/27/2008  . OBESITY 09/27/2008  . DEPRESSION 09/27/2008  . Essential hypertension 09/27/2008  . ALLERGIC RHINITIS 09/27/2008  .  ASTHMA, CHILDHOOD 09/27/2008    Palliative Care Assessment & Plan   Patient Profile:  84 yo lady who lives at [facility], has 2 daughters, with the following active hospital issues: Recurrent acute on chronic hypoxic respiratory failure Diffuse pulmonary infiltrates of unclear etiology PAF (cardioverted and in NSR since Jun 26 2019) CRI (stage III) Mild systolic (EF 96-78%) and grade I diastolic HF Obesity   OSA H/o ITP ( ROMIPLOSTIM & FERAHEME) Chronic anemia  DM  Assessment:  generalized weakness Shortness of breath Functional decline recently High O2 requirements  Recommendations/Plan: - recommend SNF with palliative on discharge.  Continue current mode of care for now.  No additional PMT specific recommendations at this time. Continue to monitor O2 needs, encourage PO and PT efforts.     Code Status:    Code Status Orders  (From admission, onward)         Start     Ordered   07/19/19 2131  Do not attempt resuscitation (DNR)  Continuous    Question Answer Comment  In the event of cardiac or respiratory ARREST Do not call a "code blue"   In the event of cardiac or respiratory ARREST Do not perform Intubation, CPR, defibrillation or ACLS   In the event of cardiac or respiratory ARREST Use medication by any route, position, wound care, and other measures to relive pain and suffering. May use oxygen, suction and manual treatment of airway obstruction as needed for comfort.      07/19/19 2130        Code Status History    Date Active Date Inactive Code Status Order ID Comments User Context   07/05/2019 1951 07/19/2019 0014 Partial Code [id number]  Kathi Ludwig, MD ED   07/03/2019 2358 07/05/2019 1951 Partial Code [id number]  Welford Roche, MD ED   07/03/2019 2112 07/03/2019 2358 DNR [id number]  Welford Roche, MD ED   08/24/2015 2143 08/29/2015 1750 Full Code [id number]  Toy Baker, MD Inpatient  Advance Care Planning  Activity    Advance Directive Documentation     Most Recent Value  Type of Advance Directive  Living will, Healthcare Power of Attorney  Pre-existing out of facility DNR order (yellow form or pink MOST form)  Yellow form placed in chart (order not valid for inpatient use)  "MOST" Form in Place?  -       Prognosis:   guarded   Discharge Planning:  Recommend SNF with palliative  Care plan was discussed with  Patient TRH MD.   Thank you for allowing the Palliative Medicine Team to assist in the care of this patient.   Total Time 25 Prolonged Time Billed  no       Greater than 50%  of this time was spent counseling and coordinating care related to the above assessment and plan.  Loistine Chance, MD  Please contact Palliative Medicine Team phone at [phone] for questions and concerns.

## 2019-08-03 NOTE — Progress Notes (Signed)
Pt incontinent of small amt of stool, peri care provided along with cream applied.  Pt is a/o x 4, lungs decreased on 5/l. Educated patient on the importance of using cpapp at night.  Patient stated she is trying. Will have respiratory speak to her.

## 2019-08-03 NOTE — Progress Notes (Signed)
PROGRESS NOTE    [name]  OEV:[id number] DOB: 03-17-1935 DOA: 07/19/2019 PCP: Javier Glazier, MD    Brief Narrative: 84 y.o.femalewith medical history significant of chronic ITP, CKD stage3, anemia of chronic disease, A.fib on Eliquis,sleep apnea on CPAP history of CHF HLD history of pulmonary embolism in 2010 diabetes type 2, HTN,hypothyroidism, pressure ulcer Presented withworsening shortness of breathfromSNF. Multiple recent admissions in October, most recentlyshe was discharged to SNF 1 day ago from [facility] where she was treated for acute respiratory failure and thought to be due to pneumonitis and was sent home with tapering dose of steroid. She was having increased oxygen requirement, initially managed with nonrebreather. BiPAP was startedlaterdue to worsening respiratory status. Chest x-ray with worsening of bilateral infiltrate, broader differential which includes pneumonitis, bilateral pneumonia, ARDS, pulmonary vascular congestion. She was started on broad-spectrum antibiotics,Lasix and steroids. Antibiotics were discontinued after having normal procalcitonin multiple times. She was then continued on steroids.  She got Lasix couple of times but this caused her to have severe hypernatremia.  Her oxygen requirement came down to 5 L with saturation above 90%.  She is moved out of the ICU stepdown on 08/02/2019  She was seen by PT and recommend skilled nursing facility.   Assessment & Plan:   Principal Problem:   Acute respiratory failure with hypoxia (HCC) Active Problems:   Hypothyroidism   HYPERCHOLESTEROLEMIA   OBESITY   Essential hypertension   Chronic kidney disease (CKD) stage G4/A1, severely decreased glomerular filtration rate (GFR) between 15-29 mL/min/1.73 square meter and albuminuria creatinine ratio less than 30 mg/g (HCC)   Chronic kidney disease   Chronic ITP (idiopathic thrombocytopenia) (HCC)   Normocytic anemia   Atypical atrial  flutter (HCC)   HCAP (healthcare-associated pneumonia)   Pneumonitis   Shortness of breath   Palliative care by specialist   Goals of care, counseling/discussion  #1 acute on chronic hypoxic and hypercapnic respiratory failure of unclear etiology-prior to admission she was not on oxygen.  Her oxygen requirement is decreased to 4 to 6 L to keep her saturation above 88%.  Plan is for slow taper of steroids.  PT has seen her and is recommending skilled nursing facility when she is ready to be discharged.  Family at this point is wanting to keep doing what we are doing to discharge her to SNF.  I did tell them she is not out of the woods she has a lot of chronic issues going on which includes anemia thrombocytopenia and AKI CKD and generalized deconditioning and needing aggressive physical therapy as she has been in bed for a month.  #2 hypothermia resolved  #3 hypertension Blood pressure  133/45 continue amlodipine.  #4 AKI on CKD stage III-creatinine increased to 2.48.  Lasix was held.  Creatinine coming back down to 2.26. Foley catheter was placed yesterday for urinary retention.    #5 diastolic heart failure with mild systolic heart failure-not ondiuretics  #6 chronic ITP platelet counts dropping.  She received Nplate on 82/99/3716.   #7 type 2 diabetesbloodsugarimproved. Blood sugar1 16-218.Marland Kitchen  Continue Lantus 30 units at bedtime  #8 atrial fibrillation chronic status post cardioversion 06/26/2019 now in sinus rhythm. Eliquis discontinued due to recent hemoptysis 11/13/2020and heme positive stools. She is not a candidate for further GI work-up.  #9 hypothyroidism continue Synthroid.  #10 obstructive sleep apnea on BiPAP  #11 hypernatremiaimproving sodium down to 155 from 158.  Continue to hold Lasix.   #12 depression continue Zoloft  Pressure Injury 07/08/19 Buttocks Right;Left;Medial;Lower Stage II -  Partial thickness loss of dermis presenting as a  shallow open ulcer with a red, pink wound bed without slough. Red, nonblanchable, (Active)  07/08/19 1000  Location: Buttocks  Location Orientation: Right;Left;Medial;Lower  Staging: Stage II -  Partial thickness loss of dermis presenting as a shallow open ulcer with a red, pink wound bed without slough.  Wound Description (Comments): Red, nonblanchable,  Present on Admission: Yes      Nutrition Problem: Increased nutrient needs Etiology: chronic illness(CHF)     Signs/Symptoms: estimated needs    Interventions: Glucerna shake, Prostat, MVI  Estimated body mass index is 41.16 kg/m as calculated from the following:   Height as of this encounter: 5\' 1"  (1.549 m).   Weight as of this encounter: 98.8 kg.  DVT prophylaxis:SCDs due to thrombocytopenia  code Status:DO NOT RESUSCITATE  family Communication:Discussed with daughter [name]. disposition Plan:Pending clinical improvement  Consultants:  PCCM oncology and palliative care  Procedures:None Antimicrobials:None  Subjective: Patient is resting in bed she was asleep when I walked into the room but I was able to wake her up and have a conversation she is awake alert oriented states she feels well.  Objective: Vitals:   08/02/19 1953 08/02/19 2047 08/03/19 0457 08/03/19 1136  BP: (!) 136/50  (!) 133/45   Pulse: 64 62 62   Resp: 18 17 18    Temp: 98.1 F (36.7 C)  97.7 F (36.5 C)   TempSrc: Axillary  Oral   SpO2: 100% 99% 99% 99%  Weight:   98.8 kg   Height:        Intake/Output Summary (Last 24 hours) at 08/03/2019 1205 Last data filed at 08/03/2019 0601 Gross per 24 hour  Intake 512 ml  Output 2075 ml  Net -1563 ml   Filed Weights   08/01/19 0500 08/02/19 0500 08/03/19 0457  Weight: 100.9 kg 99.2 kg 98.8 kg    Examination:  General exam: Appears calm and comfortable  Respiratory system: Lungs breath sounds at the bases to auscultation. Respiratory effort normal. Cardiovascular system: S1 &  S2 heard, RRR. No JVD, murmurs, rubs, gallops or clicks. No pedal edema. Gastrointestinal system: Abdomen is nondistended, soft and nontender. No organomegaly or masses felt. Normal bowel sounds heard. Central nervous system: Alert and oriented. No focal neurological deficits. Extremities: No edema !! skin: No rashes, lesions or ulcers Psychiatry: Judgement and insight appear normal. Mood & affect appropriate.     Data Reviewed: I have personally reviewed following labs and imaging studies  CBC: Recent Labs  Lab 07/29/19 0153 07/30/19 0719 08/02/19 0656  WBC 8.9 9.8 5.0  NEUTROABS  --   --  4.4  HGB 8.1* 8.4* 7.1*  HCT 27.2* 29.3* 25.9*  MCV 101.9* 103.9* 107.5*  PLT 111* 112* 83*   Basic Metabolic Panel: Recent Labs  Lab 07/30/19 0719 07/31/19 0238 08/01/19 0815 08/02/19 0232 08/03/19 0518  NA 154* 157* 158* 158* 155*  K 3.9 4.8 4.6 4.2 3.8  CL 105 110 113* 114* 111  CO2 39* 36* 33* 35* 36*  GLUCOSE 88 191* 133* 137* 98  BUN 97* 98* 92* 99* 87*  CREATININE 2.16* 2.18* 2.48* 2.44* 2.26*  CALCIUM 9.7 9.8 9.6 9.1 8.7*   GFR: Estimated Creatinine Clearance: 21 mL/min (A) (by C-G formula based on SCr of 2.26 mg/dL (H)). Liver Function Tests: No results for input(s): AST, ALT, ALKPHOS, BILITOT, PROT, ALBUMIN in the last 168 hours. No results for input(s): LIPASE, AMYLASE  in the last 168 hours. No results for input(s): AMMONIA in the last 168 hours. Coagulation Profile: No results for input(s): INR, PROTIME in the last 168 hours. Cardiac Enzymes: No results for input(s): CKTOTAL, CKMB, CKMBINDEX, TROPONINI in the last 168 hours. BNP (last 3 results) No results for input(s): PROBNP in the last 8760 hours. HbA1C: No results for input(s): HGBA1C in the last 72 hours. CBG: Recent Labs  Lab 08/02/19 1956 08/03/19 0005 08/03/19 0454 08/03/19 0739 08/03/19 1154  GLUCAP 218* 116* 94 105* 134*   Lipid Profile: No results for input(s): CHOL, HDL, LDLCALC, TRIG,  CHOLHDL, LDLDIRECT in the last 72 hours. Thyroid Function Tests: No results for input(s): TSH, T4TOTAL, FREET4, T3FREE, THYROIDAB in the last 72 hours. Anemia Panel: No results for input(s): VITAMINB12, FOLATE, FERRITIN, TIBC, IRON, RETICCTPCT in the last 72 hours. Sepsis Labs: No results for input(s): PROCALCITON, LATICACIDVEN in the last 168 hours.  Recent Results (from the past 240 hour(s))  Culture, blood (routine x 2)     Status: None   Collection Time: 07/24/19  1:07 PM   Specimen: BLOOD  Result Value Ref Range Status   Specimen Description   Final    BLOOD LEFT ARM Performed at [facility] [address], [place], [place] [address]    Special Requests   Final    BOTTLES DRAWN AEROBIC AND ANAEROBIC Blood Culture results may not be optimal due to an inadequate volume of blood received in culture bottles Performed at [facility] [address] [place], [place] [address]    Culture   Final    NO GROWTH 5 DAYS Performed at [facility], [place] [address] [place], [place] [address]    Report Status 07/29/2019 FINAL  Final  Culture, blood (routine x 2)     Status: None   Collection Time: 07/24/19  1:14 PM   Specimen: BLOOD  Result Value Ref Range Status   Specimen Description   Final    BLOOD LEFT ARM Performed at [facility] [address] [place], [place] [address]    Special Requests   Final    BOTTLES DRAWN AEROBIC AND ANAEROBIC Blood Culture adequate volume Performed at [facility] [address] [place], [place] [address]    Culture   Final    NO GROWTH 5 DAYS Performed at [facility], [place] [address] [place], [place] [address]    Report Status 07/29/2019 FINAL  Final  Culture, Urine     Status: Abnormal   Collection Time: 07/27/19  3:19 PM   Specimen: Urine, Random  Result Value Ref Range Status   Specimen Description   Final    URINE, RANDOM Performed at [facility] [address], [place], [place] [address]    Special Requests   Final    NONE Performed at [facility], [place] [address], [place], [place] [address]    Culture (A)  Final    <10,000 COLONIES/mL INSIGNIFICANT GROWTH Performed at [facility] [address], [place], [place] [address]    Report Status 07/28/2019 FINAL  Final  Culture, blood (routine x 2)     Status: None   Collection Time: 07/27/19  3:36 PM   Specimen: BLOOD  Result Value Ref Range Status   Specimen Description   Final    BLOOD LEFT ANTECUBITAL Performed at [facility] [address] [place], [place] [address]  Special Requests   Final    BOTTLES DRAWN AEROBIC AND ANAEROBIC Blood Culture adequate volume Performed at [facility] [address] [place], [place] [address]    Culture   Final    NO GROWTH 5 DAYS Performed at [facility], [place] [address] [place], [place] [address]    Report Status 08/01/2019 FINAL  Final  Culture, blood (routine x 2)     Status: None   Collection Time: 07/27/19  3:36 PM   Specimen: BLOOD  Result Value Ref Range Status   Specimen Description   Final    BLOOD LEFT ARM Performed at [facility] [address], [place], [place] [address]    Special Requests   Final    BOTTLES DRAWN AEROBIC AND ANAEROBIC Blood Culture adequate volume Performed at [facility] [address] [place], [place] [address]    Culture   Final    NO GROWTH 5 DAYS Performed at [facility], [place] [address], [place], [place] [address]    Report Status 08/01/2019 FINAL  Final         Radiology Studies: No results found.      Scheduled Meds: . sodium chloride   Intravenous Once  . amLODipine  2.5 mg Oral Daily  . atorvastatin  40 mg Oral QHS  . chlorhexidine  15 mL Mouth Rinse BID  . Chlorhexidine Gluconate Cloth  6 each Topical Daily  . docusate  sodium  200 mg Oral QHS  . feeding supplement (ENSURE ENLIVE)  237 mL Oral TID BM  . feeding supplement (PRO-STAT SUGAR FREE 64)  30 mL Oral BID  . insulin aspart  0-20 Units Subcutaneous Q4H  . insulin glargine  30 Units Subcutaneous QHS  . levothyroxine  112 mcg Oral QAC breakfast  . mouth rinse  15 mL Mouth Rinse q12n4p  . methylPREDNISolone (SOLU-MEDROL) injection  40 mg Intravenous Daily  . nystatin cream   Topical BID  . sertraline  100 mg Oral QPM  . sodium chloride flush  3 mL Intravenous Q12H   Continuous Infusions: . sodium chloride       LOS: 15 days     Georgette Shell, MD [facility]  If 7PM-7AM, please contact night-coverage [email or web address] Password [facility] 08/03/2019, 12:05 PM

## 2019-08-04 ENCOUNTER — Inpatient Hospital Stay (HOSPITAL_COMMUNITY): Payer: Medicare Other

## 2019-08-04 LAB — MAGNESIUM: Magnesium: 2.6 mg/dL — ABNORMAL HIGH (ref 1.7–2.4)

## 2019-08-04 LAB — COMPREHENSIVE METABOLIC PANEL
ALT: 20 U/L (ref 0–44)
AST: 21 U/L (ref 15–41)
Albumin: 3 g/dL — ABNORMAL LOW (ref 3.5–5.0)
Alkaline Phosphatase: 45 U/L (ref 38–126)
Anion gap: 7 (ref 5–15)
BUN: 85 mg/dL — ABNORMAL HIGH (ref 8–23)
CO2: 33 mmol/L — ABNORMAL HIGH (ref 22–32)
Calcium: 8.3 mg/dL — ABNORMAL LOW (ref 8.9–10.3)
Chloride: 108 mmol/L (ref 98–111)
Creatinine, Ser: 1.98 mg/dL — ABNORMAL HIGH (ref 0.44–1.00)
GFR calc Af Amer: 27 mL/min — ABNORMAL LOW (ref >60)
GFR calc non Af Amer: 23 mL/min — ABNORMAL LOW (ref >60)
Glucose, Bld: 281 mg/dL — ABNORMAL HIGH (ref 70–99)
Potassium: 3.5 mmol/L (ref 3.5–5.1)
Sodium: 148 mmol/L — ABNORMAL HIGH (ref 135–145)
Total Bilirubin: 1 mg/dL (ref 0.3–1.2)
Total Protein: 5.6 g/dL — ABNORMAL LOW (ref 6.5–8.1)

## 2019-08-04 LAB — CBC
HCT: 27.3 % — ABNORMAL LOW (ref 36.0–46.0)
Hemoglobin: 8 g/dL — ABNORMAL LOW (ref 12.0–15.0)
MCH: 30.4 pg (ref 26.0–34.0)
MCHC: 29.3 g/dL — ABNORMAL LOW (ref 30.0–36.0)
MCV: 103.8 fL — ABNORMAL HIGH (ref 80.0–100.0)
Platelets: 82 10*3/uL — ABNORMAL LOW (ref 150–400)
RBC: 2.63 MIL/uL — ABNORMAL LOW (ref 3.87–5.11)
RDW: 22.4 % — ABNORMAL HIGH (ref 11.5–15.5)
WBC: 5.7 10*3/uL (ref 4.0–10.5)
nRBC: 0 % (ref 0.0–0.2)

## 2019-08-04 LAB — SARS CORONAVIRUS 2 (TAT 6-24 HRS): SARS Coronavirus 2: NEGATIVE

## 2019-08-04 LAB — GLUCOSE, CAPILLARY
Glucose-Capillary: 131 mg/dL — ABNORMAL HIGH (ref 70–99)
Glucose-Capillary: 167 mg/dL — ABNORMAL HIGH (ref 70–99)
Glucose-Capillary: 195 mg/dL — ABNORMAL HIGH (ref 70–99)
Glucose-Capillary: 279 mg/dL — ABNORMAL HIGH (ref 70–99)
Glucose-Capillary: 73 mg/dL (ref 70–99)

## 2019-08-04 MED ORDER — HYDROCODONE-ACETAMINOPHEN 5-325 MG PO TABS
1.0000 | ORAL_TABLET | Freq: Four times a day (QID) | ORAL | Status: DC | PRN
  Administered 2019-08-05 – 2019-08-07 (×4): 2 via ORAL
  Filled 2019-08-04: qty 1
  Filled 2019-08-04 (×4): qty 2

## 2019-08-04 MED ORDER — OXYCODONE HCL 5 MG PO TABS
5.0000 mg | ORAL_TABLET | Freq: Four times a day (QID) | ORAL | Status: DC | PRN
  Administered 2019-08-04: 5 mg via ORAL
  Filled 2019-08-04 (×2): qty 1

## 2019-08-04 MED ORDER — LIDOCAINE HCL URETHRAL/MUCOSAL 2 % EX GEL
1.0000 "application " | CUTANEOUS | Status: DC | PRN
  Administered 2019-08-05: 1 via TOPICAL
  Filled 2019-08-04 (×2): qty 5

## 2019-08-04 NOTE — Progress Notes (Addendum)
PROGRESS NOTE    [name]  WVP:[id number] DOB: October 02, 1934 DOA: 07/19/2019 PCP: Javier Glazier, MD  Brief Narrative: 84 y.o.femalewith medical history significant of chronic ITP, CKD stage3, anemia of chronic disease, A.fib on Eliquis,sleep apnea on CPAP history of CHF HLD history of pulmonary embolism in 2010 diabetes type 2, HTN,hypothyroidism, pressure ulcer Presented withworsening shortness of breathfromSNF. Multiple recent admissions in October, most recentlyshe was discharged to SNF 1 day ago from [facility] where she was treated for acute respiratory failure and thought to be due to pneumonitis and was sent home with tapering dose of steroid. She was having increased oxygen requirement, initially managed with nonrebreather. BiPAP was startedlaterdue to worsening respiratory status. Chest x-ray with worsening of bilateral infiltrate, broader differential which includes pneumonitis, bilateral pneumonia, ARDS, pulmonary vascular congestion. She was started on broad-spectrum antibiotics,Lasix and steroids. Antibiotics were discontinued after having normal procalcitonin multiple times. She was then continued on steroids.  She got Lasix couple of times but this caused her to have severe hypernatremia.  Her oxygen requirement came down to 5 L with saturation above 90%.  She is moved out of the ICU stepdown on 08/02/2019  She was seen by PT and recommend skilled nursing facility.  Assessment & Plan:   Principal Problem:   Acute respiratory failure with hypoxia (HCC) Active Problems:   Hypothyroidism   HYPERCHOLESTEROLEMIA   OBESITY   Essential hypertension   Chronic kidney disease (CKD) stage G4/A1, severely decreased glomerular filtration rate (GFR) between 15-29 mL/min/1.73 square meter and albuminuria creatinine ratio less than 30 mg/g (HCC)   Chronic kidney disease   Chronic ITP (idiopathic thrombocytopenia) (HCC)   Normocytic anemia   Atypical atrial flutter  (HCC)   HCAP (healthcare-associated pneumonia)   Pneumonitis   Shortness of breath   Palliative care by specialist   Goals of care, counseling/discussion  #1 acute on chronic hypoxic and hypercapnic respiratory failure of unclear etiology-prior to admission she was not on oxygen.Her oxygen requirement is decreased to 4 to 6 L to keep her saturation above 88%. Plan is for slow taper of steroids. PT has seen her and is recommending skilled nursing facility when she is ready to be discharged. Family at this point is wanting to keep doing what we are doing to discharge her to SNF. I did tell them she is not out of the woods she has a lot of chronic issues going on which includes anemia thrombocytopenia and AKI CKD and generalized deconditioning and needing aggressive physical therapy as she has been in bed for a month.  Chest x-ray 08/04/2019 shows acute pneumonia new since prior chest x-ray in the left lower lobe and to a lesser extent the right lung base stable cardiomegaly without pulmonary edema.  #2 hypothermia resolved  #3 hypertension Blood pressure 133/45continue amlodipine.  #4 AKI on CKD stage III-creatinine  trending down to 1.98 down from 2.48.  Continue to hold Lasix.Foley catheter was placed 08/03/2019 for urinary retention.   #5 diastolic heart failure with mild systolic heart failure-not ondiuretics.  Stable  #6 chronic ITPplatelet counts 82.  She received Nplate on 54/62/7035. Followed by Dr. Marin Olp.  #7 type 2 diabetesbloodsugarimproved. Blood sugar73 this morning.  She was drinking orange juice.  Will decrease Lantus to 25 units at bedtime.   #8 atrial fibrillation chronic status post cardioversion 06/26/2019 now in sinus rhythm. Eliquis discontinued due to recent hemoptysis 11/13/2020and heme positive stools. She is not a candidate for further GI work-up.  #9 hypothyroidism continue  Synthroid.  #10 obstructive sleep apnea patient does  not want to use CPAP or BiPAP at night.  She feels it very uncomfortable.  #11 hypernatremiaimproving sodium down to 148 from 155.   #12 depression continue Zoloft  #13 intertrigo/MAST consulted wound care.  Appreciate their input.    Pressure Injury 07/08/19 Buttocks Right;Left;Medial;Lower Stage II -  Partial thickness loss of dermis presenting as a shallow open ulcer with a red, pink wound bed without slough. Red, nonblanchable, (Active)  07/08/19 1000  Location: Buttocks  Location Orientation: Right;Left;Medial;Lower  Staging: Stage II -  Partial thickness loss of dermis presenting as a shallow open ulcer with a red, pink wound bed without slough.  Wound Description (Comments): Red, nonblanchable,  Present on Admission: Yes      Nutrition Problem: Increased nutrient needs Etiology: chronic illness(CHF)     Signs/Symptoms: estimated needs    Interventions: Glucerna shake, Prostat, MVI  Estimated body mass index is 41.16 kg/m as calculated from the following:   Height as of this encounter: 5\' 1"  (1.549 m).   Weight as of this encounter: 98.8 kg.  DVT prophylaxis:SCDs due to thrombocytopenia  code Status:DO NOT RESUSCITATE  family Communication:Discussed with daughterLisa. disposition Plan:Pending clinical improvement PT recommends SNF.  Consultants:  PCCM oncology and palliative care  Procedures:None Antimicrobials:None   Subjective:  Awake alert talking in full sentences  Says blood sugar is low  Feels breathing better   Objective: Vitals:   08/03/19 1503 08/03/19 2032 08/03/19 2300 08/04/19 0430  BP: (!) 131/41 (!) 149/61  (!) 135/57  Pulse: 68 65  67  Resp: 18 20  16   Temp: 98.6 F (37 C) 98.5 F (36.9 C)  98.2 F (36.8 C)  TempSrc:  Oral  Oral  SpO2: 100% 98% 98% 98%  Weight:      Height:        Intake/Output Summary (Last 24 hours) at 08/04/2019 1024 Last data filed at 08/04/2019 0432 Gross per 24 hour  Intake 240 ml   Output 750 ml  Net -510 ml   Filed Weights   08/01/19 0500 08/02/19 0500 08/03/19 0457  Weight: 100.9 kg 99.2 kg 98.8 kg    Examination:  General exam: Appears calm and comfortable  Respiratory system: decreased breath sounds  to auscultation. Respiratory effort normal. Cardiovascular system: S1 & S2 heard, RRR. No JVD, murmurs, rubs, gallops or clicks. No pedal edema. Gastrointestinal system: Abdomen is nondistended, soft and nontender. No organomegaly or masses felt. Normal bowel sounds heard. Central nervous system: Alert and oriented. No focal neurological deficits. Extremities: Symmetric 5 x 5 power. Skin: No rashes, lesions or ulcers Psychiatry: Judgement and insight appear normal. Mood & affect appropriate.     Data Reviewed: I have personally reviewed following labs and imaging studies  CBC: Recent Labs  Lab 07/29/19 0153 07/30/19 0719 08/02/19 0656  WBC 8.9 9.8 5.0  NEUTROABS  --   --  4.4  HGB 8.1* 8.4* 7.1*  HCT 27.2* 29.3* 25.9*  MCV 101.9* 103.9* 107.5*  PLT 111* 112* 83*   Basic Metabolic Panel: Recent Labs  Lab 07/30/19 0719 07/31/19 0238 08/01/19 0815 08/02/19 0232 08/03/19 0518  NA 154* 157* 158* 158* 155*  K 3.9 4.8 4.6 4.2 3.8  CL 105 110 113* 114* 111  CO2 39* 36* 33* 35* 36*  GLUCOSE 88 191* 133* 137* 98  BUN 97* 98* 92* 99* 87*  CREATININE 2.16* 2.18* 2.48* 2.44* 2.26*  CALCIUM 9.7 9.8 9.6 9.1 8.7*   GFR:  Estimated Creatinine Clearance: 21 mL/min (A) (by C-G formula based on SCr of 2.26 mg/dL (H)). Liver Function Tests: No results for input(s): AST, ALT, ALKPHOS, BILITOT, PROT, ALBUMIN in the last 168 hours. No results for input(s): LIPASE, AMYLASE in the last 168 hours. No results for input(s): AMMONIA in the last 168 hours. Coagulation Profile: No results for input(s): INR, PROTIME in the last 168 hours. Cardiac Enzymes: No results for input(s): CKTOTAL, CKMB, CKMBINDEX, TROPONINI in the last 168 hours. BNP (last 3 results) No  results for input(s): PROBNP in the last 8760 hours. HbA1C: No results for input(s): HGBA1C in the last 72 hours. CBG: Recent Labs  Lab 08/03/19 1154 08/03/19 1617 08/03/19 2041 08/03/19 2343 08/04/19 0741  GLUCAP 134* 252* 135* 135* 73   Lipid Profile: No results for input(s): CHOL, HDL, LDLCALC, TRIG, CHOLHDL, LDLDIRECT in the last 72 hours. Thyroid Function Tests: No results for input(s): TSH, T4TOTAL, FREET4, T3FREE, THYROIDAB in the last 72 hours. Anemia Panel: No results for input(s): VITAMINB12, FOLATE, FERRITIN, TIBC, IRON, RETICCTPCT in the last 72 hours. Sepsis Labs: No results for input(s): PROCALCITON, LATICACIDVEN in the last 168 hours.  Recent Results (from the past 240 hour(s))  Culture, Urine     Status: Abnormal   Collection Time: 07/27/19  3:19 PM   Specimen: Urine, Random  Result Value Ref Range Status   Specimen Description   Final    URINE, RANDOM Performed at [facility] [address] [place], [place] [address]    Special Requests   Final    NONE Performed at [facility], [place] [address] [place], [place] [address]    Culture (A)  Final    <10,000 COLONIES/mL INSIGNIFICANT GROWTH Performed at [facility] [address], [place], [place] [address]    Report Status 07/28/2019 FINAL  Final  Culture, blood (routine x 2)     Status: None   Collection Time: 07/27/19  3:36 PM   Specimen: BLOOD  Result Value Ref Range Status   Specimen Description   Final    BLOOD LEFT ANTECUBITAL Performed at [facility] [address], [place], [place] [address]    Special Requests   Final    BOTTLES DRAWN AEROBIC AND ANAEROBIC Blood Culture adequate volume Performed at [facility] [address], [place], [place] [address]    Culture   Final    NO GROWTH 5 DAYS Performed at [facility], [place] [address], [place], [place] [address]    Report Status 08/01/2019 FINAL   Final  Culture, blood (routine x 2)     Status: None   Collection Time: 07/27/19  3:36 PM   Specimen: BLOOD  Result Value Ref Range Status   Specimen Description   Final    BLOOD LEFT ARM Performed at [facility] [address], [place], [place] [address]    Special Requests   Final    BOTTLES DRAWN AEROBIC AND ANAEROBIC Blood Culture adequate volume Performed at [facility] [address], [place], [place] [address]    Culture   Final    NO GROWTH 5 DAYS Performed at [facility], [place] [address] [place], [place] [address]    Report Status 08/01/2019 FINAL  Final         Radiology Studies: No results found.      Scheduled Meds: . amLODipine  2.5 mg Oral Daily  . atorvastatin  40 mg Oral QHS  . chlorhexidine  15 mL Mouth Rinse BID  . Chlorhexidine Gluconate Cloth  6 each Topical Daily  . docusate sodium  200 mg Oral QHS  . feeding supplement (ENSURE ENLIVE)  237 mL Oral TID BM  . feeding supplement (PRO-STAT SUGAR FREE 64)  30 mL Oral BID  . insulin aspart  0-20 Units Subcutaneous Q4H  . insulin glargine  30 Units Subcutaneous QHS  . levothyroxine  112 mcg Oral QAC breakfast  . mouth rinse  15 mL Mouth Rinse q12n4p  . methylPREDNISolone (SOLU-MEDROL) injection  40 mg Intravenous Daily  . nystatin cream   Topical BID  . sertraline  100 mg Oral QPM  . sodium chloride flush  3 mL Intravenous Q12H   Continuous Infusions: . sodium chloride       LOS: 16 days     Georgette Shell, MD [facility]  If 7PM-7AM, please contact night-coverage [email or web address] Password [facility] 08/04/2019, 10:24 AM

## 2019-08-04 NOTE — Progress Notes (Signed)
Occupational Therapy Treatment Patient Details Name: [name] MRN: [id number] DOB: [birth year] Today's Date: 08/04/2019    History of present illness Pt is 84 y/o F with PMH: CKD stage 3, Anemia, Afib (on Elaquis), OSA on CPAP. Was d/c'ed to SNF from [facility] for respiratory failure on 07/18/19 (several recent admissions). Pt re-presented to acute setting with increasing oxygen requirement  on 11/11, required BiPaPand found to have worsening bilateral lung infiltrates on CXR, pneumonitis, b/l PNA, ARDS.   OT comments  Pt seen for OT tx this date to f/u re: improving fxl activity tolerance and increasing tolerance for ADL mobility. Pt declines to attempt to transition from bed to EOB sitting citing buttocks pain despite OT providing education that extended time in bed increases risk for further sore development. Pt agreeable to in bed exercise and OT facilitates pt participation in UE therex with light resistance theraband to improve UE strength as it pertains to performance of ADLs and ADL mobility. In addition, OT engages pt in in-bed repositioning including propulsion towards HOB with MAX A and hand over hand guidance to assist pt with hand/foot placement to participate in adjusting positioning. Overall, SNF remains most appropriate d/c disposition.   Follow Up Recommendations  SNF    Equipment Recommendations       Recommendations for Other Services      Precautions / Restrictions Precautions Precautions: Fall Precaution Comments: 3L O2 Restrictions Weight Bearing Restrictions: No       Mobility Bed Mobility Overal bed mobility: Needs Assistance             General bed mobility comments: OT facilitates pt propulsion toward HOB with MAX A using hand over hand guiadnce to assist pt with hand/foot placement to encourage participation and utilizes trend bed setting to assist as well and reduce friction d/t pain with pain in buttocks.  Transfers                  General transfer comment: transfers deferred, pt with c/o too severe of pain on buttocks.    Balance       Sitting balance - Comments: deferred       Standing balance comment: deferred                           ADL either performed or assessed with clinical judgement   ADL                                               Vision Patient Visual Report: No change from baseline     Perception     Praxis      Cognition Arousal/Alertness: Awake/alert Behavior During Therapy: WFL for tasks assessed/performed Overall Cognitive Status: History of cognitive impairments - at baseline                                 General Comments: some instance of decreased short term memory and slow processing. Follows one step commands appropriately, but difficulty sequencing higher level tasks without MIN cueing        Exercises Other Exercises Other Exercises: OT facilitates education including demonstration and handout re: bed level UE therex with light resistance band. Pt requires MIN/MOD verbal/visual cue for form and pace on return demonstration.  Other Exercises: OT facilitates pt participation in one set x10 reps chest press with light weight resistance band, 10 reps bicep curls, 10 reps tricep extension, and 10 reps supine serratus punches. Pt tolerates well, but somewhat fatigued requiring MIN verbal/visual cues to attend to task throughout.   Shoulder Instructions       General Comments      Pertinent Vitals/ Pain       Pain Assessment: Faces Faces Pain Scale: Hurts whole lot Pain Location: buttocks with any movement including in-bed repositioning. Pain Descriptors / Indicators: Grimacing;Moaning Pain Intervention(s): Limited activity within patient's tolerance;Monitored during session  Home Living Family/patient expects to be discharged to:: Skilled nursing facility                                        Prior  Functioning/Environment              Frequency  Min 2X/week        Progress Toward Goals  OT Goals(current goals can now be found in the care plan section)  Progress towards OT goals: Progressing toward goals  Acute Rehab OT Goals Patient Stated Goal: to do more for myself OT Goal Formulation: With patient Time For Goal Achievement: 08/18/19 Potential to Achieve Goals: Good  Plan Discharge plan remains appropriate;Frequency remains appropriate    Co-evaluation                 AM-PAC OT "6 Clicks" Daily Activity     Outcome Measure   Help from another person eating meals?: A Little Help from another person taking care of personal grooming?: A Little Help from another person toileting, which includes using toliet, bedpan, or urinal?: Total Help from another person bathing (including washing, rinsing, drying)?: Total Help from another person to put on and taking off regular upper body clothing?: A Lot Help from another person to put on and taking off regular lower body clothing?: Total 6 Click Score: 11    End of Session Equipment Utilized During Treatment: Oxygen  OT Visit Diagnosis: Muscle weakness (generalized) (M62.81)   Activity Tolerance Patient limited by fatigue   Patient Left in bed;with call bell/phone within reach;with bed alarm set;with family/visitor present   Nurse Communication Mobility status        Time: 6606-3016 OT Time Calculation (min): 35 min  Charges: OT General Charges $OT Visit: 1 Visit OT Treatments $Therapeutic Activity: 8-22 mins $Therapeutic Exercise: 8-22 mins  Gerrianne Scale, MS, OTR/L 08/04/19, 4:50 PM

## 2019-08-04 NOTE — Progress Notes (Signed)
Daughter would like to speak with Dr Rodena Piety; MD paged. Donne Hazel, RN

## 2019-08-04 NOTE — Consult Note (Signed)
WOC Nurse Consult Note: Full thickness incontinence associated skin damage (IAD) with fungal overgrowth. 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  Will implement silver textile to inguinal folds and barrier cream to labia. Is also on Diflucan systemically.  Reason for Consult: IAD to perineal and inguinal folds Wound type:MASD from incontinence Pressure Injury POA: NA Measurement:scattered nonintact lesions with satellite lesions present and tender to touch.  Wound UVO:ZDGU and moist  weeping Drainage (amount, consistency, odor) minimal serosanguinous  No odor Periwound:intact Dressing procedure/placement/frequency:Keep skin clean and dry.  No disposable briefs or underpads.  Will implement Interdry  to attempt to heal full thickness skin damage and wick moisture.  Apply to inguinal folds and pannus.  If urinary incontinence is too frequent, discontinue.  Measure and cut length of InterDry to fit in skin folds that have skin breakdown Tuck InterDry fabric into skin folds in a single layer, allow for 2 inches of overhang from skin edges to allow for wicking to occur May remove to bathe; dry area thoroughly and then tuck into affected areas again Do not apply any creams or ointments when using InterDry DO NOT THROW AWAY FOR 5 DAYS unless soiled with stool DO NOT [facility] product, this will inactivate the silver in the material  New sheet of Interdry should be applied after 5 days of use if patient continues to have skin breakdown  COntinue barrier cream to remaining skin.   Will not follow at this time.  Please re-consult if needed.  Domenic Moras MSN, RN, FNP-BC CWON Wound, Ostomy, Continence Nurse Pager [phone]

## 2019-08-04 NOTE — Progress Notes (Signed)
Physical Therapy Treatment Patient Details Name: [name] MRN: [id number] DOB: 04-28-1935 Today's Date: 08/04/2019    History of Present Illness Pt is 84 y/o F with PMH: CKD stage 3, Anemia, Afib (on Elaquis), OSA on CPAP. Was d/c'ed to SNF from [facility] for respiratory failure on 07/18/19 (several recent admissions). Pt re-presented to acute setting with increasing oxygen requirement  on 11/11, required BiPaPand found to have worsening bilateral lung infiltrates on CXR, pneumonitis, b/l PNA, ARDS.    PT Comments    Attempted bed mobility/rolling etc, pt verbalizes agreement and then resists/declines d/t incr pain with any and all movement;  Completed and encouraged UE/LE exercises as tolerated. Continue to recommend SNF.   Follow Up Recommendations  SNF     Equipment Recommendations  None recommended by PT    Recommendations for Other Services       Precautions / Restrictions Precautions Precautions: Fall Precaution Comments: 3L O2 Restrictions Weight Bearing Restrictions: No    Mobility  Bed Mobility Overal bed mobility: Needs Assistance             General bed mobility comments: attempted rolling right and left, sitting EOB, moving LEs laterally--pt would agree and then become resistant d/t pain. pt total assist +2 for scooting up in bed. placed bed in chair position, then had to lower HOB to 40 degrees  Transfers                    Ambulation/Gait                 Stairs             Wheelchair Mobility    Modified Rankin (Stroke Patients Only)       Balance                                            Cognition   Behavior During Therapy: WFL for tasks assessed/performed Overall Cognitive Status: History of cognitive impairments - at baseline                                 General Comments: decreased memory; asked for same things over and over.  Resisting movement due to pain      Exercises  General Exercises - Lower Extremity Ankle Circles/Pumps: AROM;10 reps;Both Short Arc Quad: AROM;5 reps;Both Shoulder flexion x10 bil Elbow flexion x10 bil Elbow extension x 10 with manual resistance     General Comments        Pertinent Vitals/Pain      Home Living Family/patient expects to be discharged to:: Skilled nursing facility                    Prior Function            PT Goals (current goals can now be found in the care plan section) Acute Rehab PT Goals Patient Stated Goal: do for myself, walk PT Goal Formulation: With patient Time For Goal Achievement: 08/11/19 Potential to Achieve Goals: Fair Progress towards PT goals: Progressing toward goals    Frequency    Min 2X/week      PT Plan Current plan remains appropriate    Co-evaluation              AM-PAC PT "6 Clicks" Mobility  Outcome Measure  Help needed turning from your back to your side while in a flat bed without using bedrails?: Total Help needed moving from lying on your back to sitting on the side of a flat bed without using bedrails?: Total Help needed moving to and from a bed to a chair (including a wheelchair)?: Total Help needed standing up from a chair using your arms (e.g., wheelchair or bedside chair)?: Total Help needed to walk in hospital room?: Total Help needed climbing 3-5 steps with a railing? : Total 6 Click Score: 6    End of Session Equipment Utilized During Treatment: Oxygen Activity Tolerance: Patient limited by pain Patient left: with call bell/phone within reach;in bed;with bed alarm set;with family/visitor present   PT Visit Diagnosis: Muscle weakness (generalized) (M62.81);Other abnormalities of gait and mobility (R26.89)     Time: 1424-1450 PT Time Calculation (min) (ACUTE ONLY): 26 min  Charges:  $Therapeutic Activity: 23-37 mins                     Kenyon Ana, PT  Pager: [phone] Acute Rehab Dept [facility]):  [phone]   08/04/2019    [facility] 08/04/2019, 3:13 PM

## 2019-08-04 NOTE — Progress Notes (Signed)
Pt had cpapp placed on this evening by respiratory, she called and requested to have it removed at 0150. Removed at this time, will continue to monitor

## 2019-08-05 LAB — GLUCOSE, CAPILLARY
Glucose-Capillary: 65 mg/dL — ABNORMAL LOW (ref 70–99)
Glucose-Capillary: 96 mg/dL (ref 70–99)
Glucose-Capillary: 238 mg/dL — ABNORMAL HIGH (ref 70–99)
Glucose-Capillary: 272 mg/dL — ABNORMAL HIGH (ref 70–99)
Glucose-Capillary: 295 mg/dL — ABNORMAL HIGH (ref 70–99)

## 2019-08-05 MED ORDER — INSULIN GLARGINE 100 UNIT/ML ~~LOC~~ SOLN
25.0000 [IU] | Freq: Every day | SUBCUTANEOUS | Status: DC
  Administered 2019-08-05 – 2019-08-06 (×2): 25 [IU] via SUBCUTANEOUS
  Filled 2019-08-05 (×3): qty 0.25

## 2019-08-05 MED ORDER — INSULIN ASPART 100 UNIT/ML ~~LOC~~ SOLN
0.0000 [IU] | Freq: Three times a day (TID) | SUBCUTANEOUS | Status: DC
  Administered 2019-08-06: 11 [IU] via SUBCUTANEOUS
  Administered 2019-08-06 (×2): 4 [IU] via SUBCUTANEOUS
  Administered 2019-08-07: 7 [IU] via SUBCUTANEOUS
  Administered 2019-08-07: 4 [IU] via SUBCUTANEOUS

## 2019-08-05 MED ORDER — FLUCONAZOLE 100 MG PO TABS
100.0000 mg | ORAL_TABLET | Freq: Every day | ORAL | Status: AC
  Administered 2019-08-06 – 2019-08-07 (×2): 100 mg via ORAL
  Filled 2019-08-05 (×2): qty 1

## 2019-08-05 MED ORDER — INSULIN ASPART 100 UNIT/ML ~~LOC~~ SOLN
0.0000 [IU] | Freq: Every day | SUBCUTANEOUS | Status: DC
  Administered 2019-08-05 – 2019-08-06 (×2): 3 [IU] via SUBCUTANEOUS

## 2019-08-05 MED ORDER — FLUCONAZOLE 100 MG PO TABS
200.0000 mg | ORAL_TABLET | Freq: Every day | ORAL | Status: DC
  Administered 2019-08-05: 200 mg via ORAL
  Filled 2019-08-05: qty 2

## 2019-08-05 MED ORDER — PREDNISONE 20 MG PO TABS
40.0000 mg | ORAL_TABLET | Freq: Every day | ORAL | Status: DC
  Administered 2019-08-05 – 2019-08-07 (×3): 40 mg via ORAL
  Filled 2019-08-05 (×3): qty 2

## 2019-08-05 NOTE — Progress Notes (Signed)
Paged dr to request blood sugar checks bechanged to ac/hs.

## 2019-08-05 NOTE — Progress Notes (Signed)
PROGRESS NOTE    SHALAYNE LEACH  NWG:[id number] DOB: 1935/02/16 DOA: 07/19/2019 PCP: Javier Glazier, MD    Brief Narrative:84 y.o.femalewith medical history significant of chronic ITP, CKD stage3, anemia of chronic disease, A.fib on Eliquis,sleep apnea on CPAP history of CHF HLD history of pulmonary embolism in 2010 diabetes type 2, HTN,hypothyroidism, pressure ulcer Presented withworsening shortness of breathfromSNF. Multiple recent admissions in October, most recentlyshe was discharged to SNF 1 day ago from [facility] where she was treated for acute respiratory failure and thought to be due to pneumonitis and was sent home with tapering dose of steroid. She was having increased oxygen requirement, initially managed with nonrebreather. BiPAP was startedlaterdue to worsening respiratory status. Chest x-ray with worsening of bilateral infiltrate, broader differential which includes pneumonitis, bilateral pneumonia, ARDS, pulmonary vascular congestion. She was started on broad-spectrum antibiotics,Lasix and steroids. Antibiotics were discontinued after having normal procalcitonin multiple times. She was then continued on steroids. She got Lasix couple of times but this caused her to have severe hypernatremia. Her oxygen requirement came down to 5 L with saturation above 90%. She is moved out of the ICU stepdown on 08/02/2019.  PCCM recommends a very very slow taper of her prednisone in 4 to 6 weeks. Chest x-ray done 08/04/2019 showed acute pneumonia in the left lower lobe and to a lesser extent the right lung base with stable cardiomegaly without pulmonary edema.  She was seen by PT and recommend skilled nursing facility.   Assessment & Plan:   Principal Problem:   Acute respiratory failure with hypoxia (HCC) Active Problems:   Hypothyroidism   HYPERCHOLESTEROLEMIA   OBESITY   Essential hypertension   Chronic kidney disease (CKD) stage G4/A1, severely decreased  glomerular filtration rate (GFR) between 15-29 mL/min/1.73 square meter and albuminuria creatinine ratio less than 30 mg/g (HCC)   Chronic kidney disease   Chronic ITP (idiopathic thrombocytopenia) (HCC)   Normocytic anemia   Atypical atrial flutter (HCC)   HCAP (healthcare-associated pneumonia)   Pneumonitis   Shortness of breath   Palliative care by specialist   Goals of care, counseling/discussion   #1 acute on chronic hypoxic and hypercapnic respiratory failure with diffuse bilateral patchy infiltrates of unclear etiology-prior to admission she was not on oxygen. All work-up was negative.  PCCM felt she is not a good candidate for bronchoscopy.  Serology was negative for connective tissue disease.  She was not on any usual suspects like amiodarone nitrofurantoin or chemotherapy.  She was initially on 30 L of oxygen with nonrebreather mask and high flow nasal cannula.  Today she is on 2 L of oxygen.  Will switch her to prednisone today.  DC Solu-Medrol today.  PT has seen her and is recommending skilled nursing facility when she is ready to be discharged.   Chest x-ray 08/04/2019 shows acute pneumonia new since prior chest x-ray in the left lower lobe and to a lesser extent the right lung base stable cardiomegaly without pulmonary edema.  Discussed with PCCM.  This was not treated as patient's clinical picture did not fit with the chest x-ray findings.  Overall she made progress she has been afebrile white count has been normal oxygen saturation has been better on very minimal amount of oxygen.  I have ordered a Covid test on 08/04/2019 for SNF placement she will go back to the same place that she came from [facility].  #2 hypothermia resolved  #3 hypertension Blood pressure159/58 prior to a.m. medications. Continue amlodipine.  #4 AKI  on CKD stage III-creatinine trending down to 1.98 down from 2.48.  Continue to hold Lasix.  She appears more on the dry side at this time.  Foley catheter was placed 08/03/2019 for urinary retention.   #5 diastolic heart failure with mild systolic heart failure-not ondiuretics.  Stable  #6 chronic ITPplatelet counts 82.She received Nplate on 24/58/0998. Followed by Dr. Marin Olp.  #7 type 2 diabetesbloodsugarimproved. Blood sugar ranging between 65-279.  It appears that her a.m. fasting sugar is always on the low side. She was 65 this morning.  Will decrease Lantus further to 25 units nightly.     #8 atrial fibrillation chronic status post cardioversion 06/26/2019 now in sinus rhythm. Eliquis discontinued due to recent hemoptysis 11/13/2020and heme positive stools. She is not a candidate for further GI work-up.  #9 hypothyroidism continue Synthroid.  #10 obstructive sleep apnea  she used CPAP overnight till 5 AM and apparently was more tolerable per night staff.    #11 hypernatremiaimproving sodium down to 148 from 155.   #12 depression continue Zoloft  #13  MAST/ICD-moisture associated skin damage and incontinence associated skin damage with satellite lesions- Appreciate wound care input.  They have recommended silver textile to inguinal fold and barrier cream to labia and Interdry to fit skin folds.  Continue lidocaine cream for pain control.  I will start her on Diflucan for 3 days.  Pressure Injury 07/08/19 Buttocks Right;Left;Medial;Lower Stage II -  Partial thickness loss of dermis presenting as a shallow open ulcer with a red, pink wound bed without slough. Red, nonblanchable, (Active)  07/08/19 1000  Location: Buttocks  Location Orientation: Right;Left;Medial;Lower  Staging: Stage II -  Partial thickness loss of dermis presenting as a shallow open ulcer with a red, pink wound bed without slough.  Wound Description (Comments): Red, nonblanchable,  Present on Admission: Yes      Nutrition Problem: Increased nutrient needs Etiology: chronic illness(CHF)     Signs/Symptoms: estimated  needs    Interventions: Glucerna shake, Prostat, MVI  Estimated body mass index is 41.95 kg/m as calculated from the following:   Height as of this encounter: 5\' 1"  (1.549 m).   Weight as of this encounter: 100.7 kg.  DVT prophylaxis:SCDs due to thrombocytopenia  code Status:DO NOT RESUSCITATE  family Communication:Discussed with daughterLisa. disposition Plan:Pending clinical improvement PT recommends SNF.  Consultants:  PCCM oncology and palliative care  Procedures:None Antimicrobials:None  Subjective: Patient sleeping.  Night staff reported she had a good night.  She kept the CPAP on till 5 AM.  Her perineal area also looks better than before.  [birth year] Objective: Vitals:   08/04/19 1106 08/04/19 1324 08/04/19 1900 08/05/19 0455  BP:  (!) 148/51 (!) 145/58 (!) 159/58  Pulse:  68 63 70  Resp:  18 18 18   Temp:  98.1 F (36.7 C) 97.7 F (36.5 C)   TempSrc:  Oral Oral   SpO2: 96% 95% 97% 97%  Weight:    100.7 kg  Height:        Intake/Output Summary (Last 24 hours) at 08/05/2019 0816 Last data filed at 08/04/2019 1759 Gross per 24 hour  Intake 240 ml  Output -  Net 240 ml   Filed Weights   08/02/19 0500 08/03/19 0457 08/05/19 0455  Weight: 99.2 kg 98.8 kg 100.7 kg    Examination:  General exam: Appears calm and comfortable  Respiratory system: Few scattered rhonchi to auscultation. Respiratory effort normal. Cardiovascular system: S1 & S2 heard, RRR. No JVD,  murmurs, rubs, gallops or clicks. No pedal edema. Gastrointestinal system: Abdomen is nondistended, soft and nontender. No organomegaly or masses felt. Normal bowel sounds heard. Central nervous system: Alert and oriented. No focal neurological deficits. Extremities: No edema. Skin: No rashes, lesions or ulcers Psychiatry: Judgement and insight appear normal. Mood & affect appropriate.     Data Reviewed: I have personally reviewed following labs and imaging studies   CBC: Recent Labs  Lab 07/30/19 0719 08/02/19 0656 08/04/19 1051  WBC 9.8 5.0 5.7  NEUTROABS  --  4.4  --   HGB 8.4* 7.1* 8.0*  HCT 29.3* 25.9* 27.3*  MCV 103.9* 107.5* 103.8*  PLT 112* 83* 82*   Basic Metabolic Panel: Recent Labs  Lab 07/31/19 0238 08/01/19 0815 08/02/19 0232 08/03/19 0518 08/04/19 1051  NA 157* 158* 158* 155* 148*  K 4.8 4.6 4.2 3.8 3.5  CL 110 113* 114* 111 108  CO2 36* 33* 35* 36* 33*  GLUCOSE 191* 133* 137* 98 281*  BUN 98* 92* 99* 87* 85*  CREATININE 2.18* 2.48* 2.44* 2.26* 1.98*  CALCIUM 9.8 9.6 9.1 8.7* 8.3*  MG  --   --   --   --  2.6*   GFR: Estimated Creatinine Clearance: 23.9 mL/min (A) (by C-G formula based on SCr of 1.98 mg/dL (H)). Liver Function Tests: Recent Labs  Lab 08/04/19 1051  AST 21  ALT 20  ALKPHOS 45  BILITOT 1.0  PROT 5.6*  ALBUMIN 3.0*   No results for input(s): LIPASE, AMYLASE in the last 168 hours. No results for input(s): AMMONIA in the last 168 hours. Coagulation Profile: No results for input(s): INR, PROTIME in the last 168 hours. Cardiac Enzymes: No results for input(s): CKTOTAL, CKMB, CKMBINDEX, TROPONINI in the last 168 hours. BNP (last 3 results) No results for input(s): PROBNP in the last 8760 hours. HbA1C: No results for input(s): HGBA1C in the last 72 hours. CBG: Recent Labs  Lab 08/04/19 1151 08/04/19 1614 08/04/19 2006 08/04/19 2336 08/05/19 0450  GLUCAP 279* 195* 131* 167* 65*   Lipid Profile: No results for input(s): CHOL, HDL, LDLCALC, TRIG, CHOLHDL, LDLDIRECT in the last 72 hours. Thyroid Function Tests: No results for input(s): TSH, T4TOTAL, FREET4, T3FREE, THYROIDAB in the last 72 hours. Anemia Panel: No results for input(s): VITAMINB12, FOLATE, FERRITIN, TIBC, IRON, RETICCTPCT in the last 72 hours. Sepsis Labs: No results for input(s): PROCALCITON, LATICACIDVEN in the last 168 hours.  Recent Results (from the past 240 hour(s))  Culture, Urine     Status: Abnormal   Collection  Time: 07/27/19  3:19 PM   Specimen: Urine, Random  Result Value Ref Range Status   Specimen Description   Final    URINE, RANDOM Performed at [facility] [address] [place], [place] [address]    Special Requests   Final    NONE Performed at [facility], [place] [address] [place], [place] [address]    Culture (A)  Final    <10,000 COLONIES/mL INSIGNIFICANT GROWTH Performed at [facility] [address] [place], [place] [address]    Report Status 07/28/2019 FINAL  Final  Culture, blood (routine x 2)     Status: None   Collection Time: 07/27/19  3:36 PM   Specimen: BLOOD  Result Value Ref Range Status   Specimen Description   Final    BLOOD LEFT ANTECUBITAL Performed at [facility] [address], [place], [place] [address]    Special Requests  Final    BOTTLES DRAWN AEROBIC AND ANAEROBIC Blood Culture adequate volume Performed at [facility] [address] [place], [place] [address]    Culture   Final    NO GROWTH 5 DAYS Performed at [facility], [place] [address], [place], [place] [address]    Report Status 08/01/2019 FINAL  Final  Culture, blood (routine x 2)     Status: None   Collection Time: 07/27/19  3:36 PM   Specimen: BLOOD  Result Value Ref Range Status   Specimen Description   Final    BLOOD LEFT ARM Performed at [facility] [address] [place], [place] [address]    Special Requests   Final    BOTTLES DRAWN AEROBIC AND ANAEROBIC Blood Culture adequate volume Performed at [facility] [address], [place], [place] [address]    Culture   Final    NO GROWTH 5 DAYS Performed at [facility], [place] [address], [place], [place] [address]    Report Status 08/01/2019 FINAL  Final  SARS CORONAVIRUS 2 (TAT 6-24 HRS) Nasopharyngeal Nasopharyngeal Swab     Status: None   Collection Time: 08/04/19 10:39 AM   Specimen:  Nasopharyngeal Swab  Result Value Ref Range Status   SARS Coronavirus 2 NEGATIVE NEGATIVE Final    Comment: (NOTE) SARS-CoV-2 target nucleic acids are NOT DETECTED. The SARS-CoV-2 RNA is generally detectable in upper and lower respiratory specimens during the acute phase of infection. Negative results do not preclude SARS-CoV-2 infection, do not rule out co-infections with other pathogens, and should not be used as the sole basis for treatment or other patient management decisions. Negative results must be combined with clinical observations, patient history, and epidemiological information. The expected result is Negative. Fact Sheet for Patients: SugarRoll.be Fact Sheet for Healthcare Providers: https://www.woods-mathews.com/ This test is not yet approved or cleared by the [place] FDA and  has been authorized for detection and/or diagnosis of SARS-CoV-2 by FDA under an Emergency Use Authorization (EUA). This EUA will remain  in effect (meaning this test can be used) for the duration of the COVID-19 declaration under Section 56 4(b)(1) of the Act, 21 U.S.C. section 360bbb-3(b)(1), unless the authorization is terminated or revoked sooner. Performed at [facility], [place] [address] [place], [place] [address]          Radiology Studies: Dg Chest 1 View  Result Date: 08/04/2019 CLINICAL DATA:  Acute onset of shortness of breath after being recently discharged from the hospital after treatment for acute respiratory failure. EXAM: Portable CHEST 1 VIEW COMPARISON:  07/31/2019 and earlier. FINDINGS: Cardiac silhouette moderately enlarged, unchanged. Airspace consolidation in the LEFT LOWER LOBE, new since the most recent examination, with silhouetting of the LEFT hemidiaphragm. Minimal patchy airspace opacities medially at the RIGHT lung base, also new. Pulmonary vascularity normal without evidence of pulmonary edema currently.  IMPRESSION: 1. Acute pneumonia in the LEFT lower lobe and to a lesser extent the RIGHT lung base. 2. Stable cardiomegaly without pulmonary edema currently. Electronically Signed   By: Evangeline Dakin M.D.   On: 08/04/2019 11:02        Scheduled Meds: . amLODipine  2.5 mg Oral Daily  . atorvastatin  40 mg Oral QHS  . chlorhexidine  15 mL Mouth Rinse BID  . Chlorhexidine Gluconate Cloth  6 each Topical Daily  . docusate sodium  200 mg Oral QHS  . feeding supplement (ENSURE ENLIVE)  237 mL Oral TID BM  . feeding supplement (PRO-STAT SUGAR FREE 64)  30 mL Oral BID  . insulin aspart  0-20 Units Subcutaneous Q4H  . insulin glargine  30 Units Subcutaneous QHS  . levothyroxine  112 mcg Oral QAC breakfast  . mouth rinse  15 mL Mouth Rinse q12n4p  . methylPREDNISolone (SOLU-MEDROL) injection  40 mg Intravenous Daily  . nystatin cream   Topical BID  . sertraline  100 mg Oral QPM  . sodium chloride flush  3 mL Intravenous Q12H   Continuous Infusions: . sodium chloride       LOS: 17 days     Georgette Shell, MD [facility]  If 7PM-7AM, please contact night-coverage [email or web address] Password [email or web address] 08/05/2019, 8:16 AM

## 2019-08-05 NOTE — Progress Notes (Signed)
Peri area and rectum, less red than yesterday, lidocaine used prior to care, pt tolerated well. Also Medicated prior with pain med.

## 2019-08-05 NOTE — Progress Notes (Signed)
PHARMACY NOTE:  ANTIMICROBIAL RENAL DOSAGE ADJUSTMENT  Current antimicrobial regimen includes a mismatch between antimicrobial dosage and estimated renal function.  As per policy approved by the Pharmacy & Therapeutics and Medical Executive Committees, the antimicrobial dosage will be adjusted accordingly.  Current antimicrobial dosage: Diflucan 200mg  po daily  Indication: MAST/ICD-moisture associated skin damage   Renal Function:  Estimated Creatinine Clearance: 23.9 mL/min (A) (by C-G formula based on SCr of 1.98 mg/dL (H)). []      On intermittent HD, scheduled: []      On CRRT    Antimicrobial dosage has been changed to:  Diflucan 200mg  x1 then 100mg  daily  Additional comments:  Thank you for allowing pharmacy to be a part of this patient's care.  Netta Cedars, [facility] 08/05/2019 2:04 PM

## 2019-08-06 LAB — CBC
HCT: 24.7 % — ABNORMAL LOW (ref 36.0–46.0)
HCT: 25.4 % — ABNORMAL LOW (ref 36.0–46.0)
Hemoglobin: 7.3 g/dL — ABNORMAL LOW (ref 12.0–15.0)
Hemoglobin: 7.7 g/dL — ABNORMAL LOW (ref 12.0–15.0)
MCH: 29.7 pg (ref 26.0–34.0)
MCH: 30.7 pg (ref 26.0–34.0)
MCHC: 29.6 g/dL — ABNORMAL LOW (ref 30.0–36.0)
MCHC: 30.3 g/dL (ref 30.0–36.0)
MCV: 100.4 fL — ABNORMAL HIGH (ref 80.0–100.0)
MCV: 101.2 fL — ABNORMAL HIGH (ref 80.0–100.0)
Platelets: 89 10*3/uL — ABNORMAL LOW (ref 150–400)
Platelets: 92 10*3/uL — ABNORMAL LOW (ref 150–400)
RBC: 2.46 MIL/uL — ABNORMAL LOW (ref 3.87–5.11)
RBC: 2.51 MIL/uL — ABNORMAL LOW (ref 3.87–5.11)
RDW: 21.7 % — ABNORMAL HIGH (ref 11.5–15.5)
RDW: 21.9 % — ABNORMAL HIGH (ref 11.5–15.5)
WBC: 4.7 10*3/uL (ref 4.0–10.5)
WBC: 5.1 10*3/uL (ref 4.0–10.5)
nRBC: 0 % (ref 0.0–0.2)
nRBC: 0 % (ref 0.0–0.2)

## 2019-08-06 LAB — BASIC METABOLIC PANEL
Anion gap: 7 (ref 5–15)
BUN: 75 mg/dL — ABNORMAL HIGH (ref 8–23)
CO2: 31 mmol/L (ref 22–32)
Calcium: 8.2 mg/dL — ABNORMAL LOW (ref 8.9–10.3)
Chloride: 106 mmol/L (ref 98–111)
Creatinine, Ser: 1.67 mg/dL — ABNORMAL HIGH (ref 0.44–1.00)
GFR calc Af Amer: 33 mL/min — ABNORMAL LOW (ref >60)
GFR calc non Af Amer: 28 mL/min — ABNORMAL LOW (ref >60)
Glucose, Bld: 202 mg/dL — ABNORMAL HIGH (ref 70–99)
Potassium: 3.4 mmol/L — ABNORMAL LOW (ref 3.5–5.1)
Sodium: 144 mmol/L (ref 135–145)

## 2019-08-06 LAB — GLUCOSE, CAPILLARY
Glucose-Capillary: 200 mg/dL — ABNORMAL HIGH (ref 70–99)
Glucose-Capillary: 273 mg/dL — ABNORMAL HIGH (ref 70–99)

## 2019-08-06 LAB — PREPARE RBC (CROSSMATCH)

## 2019-08-06 MED ORDER — SODIUM CHLORIDE 0.9% IV SOLUTION: Freq: Once | INTRAVENOUS | Status: AC

## 2019-08-06 NOTE — Progress Notes (Signed)
PROGRESS NOTE    [name]   QQP:[id number]  DOB: [birth year]  DOA: 07/19/2019 PCP: Javier Glazier, MD   Brief Narrative:  [name] 84 y.o.femalewith medical history significant of chronic ITP, CKD stage3, anemia of chronic disease, A.fib on Eliquis s/p cardioversion,sleep apnea on CPAP history of CHF HLD history of pulmonary embolism in 2010 diabetes type 2, HTN,hypothyroidism, pressure ulcer who presented from SNF for dyspnea. She was admitted from 10/26-11/10 to [facility] under the internal medicine teaching service. She was treated for acute respiratory failure due to pneumonitis with undetermined etiology with steroids and discharged with a steroid taper. She was on 6 L O2 at the time of discharge.  She returned to the ED on 11/11 again for respiratory failure and required a BiPAP. She was treated with steroids.   COVID neg  Subjective: The patient has no new complaints    Assessment & Plan:   Principal Problem:   Acute respiratory failure with hypoxia with bilateral infiltrates - Hypoxia has improved and she has been weaned to Prednisone 40 mg daily - Per pulm, she is not a good candidate for bronchoscopy - Pulm plans a slow taper of steroids - she has been weaned down to 2 L O2 and respiratory status is quite stable now  Active Problems: Heme + stool-anemia -Patient has had no gross blood loss however hemoglobin is down to 7.3 today -We will continue to follow this and transfuse as needed - will cont to follow heme occults as well    Hypothyroidism - cont Synthroid  Chronic diastolic CHF - euvolemic at this time    AKI on Chronic kidney disease 3 - suspected to be due to attempted diuresis with Lasix   - Cr has improved from a peal of 2.48 on 11/24 to 1.67  DM2 - cont Lantus and SSI - A1c 7.3  Chronic ITP (idiopathic thrombocytopenia)  - Heme onc consulted- she received N plate on 71/24/58     Normocytic anemia - heme + stool  noted on 11/21- Eliquis is on hold    A- fib - underwent cardioversion on 06/26/19 - Eliquis was held due to hemoptysis on 07/21/19 and now has heme + stool   OSA - cont CPAP when asleep  OF NOTE: she has severe skin breakdown in her perineum due to purewick catheters and a foley catheter was placed during this admission  Time spent in minutes: 40 DVT prophylaxis: SCDs Code Status: DNR Family Communication:   Disposition Plan:  SNF  Consultants:   PCCM  Palliative care  Heme/Once Procedures:     Antimicrobials:  Anti-infectives (From admission, onward)   Start     Dose/Rate Route Frequency Ordered Stop   08/06/19 1000  fluconazole (DIFLUCAN) tablet 100 mg     100 mg Oral Daily 08/05/19 1403 08/08/19 0959   08/05/19 1000  fluconazole (DIFLUCAN) tablet 200 mg  Status:  Discontinued     200 mg Oral Daily 08/05/19 0842 08/05/19 1403   07/22/19 0400  vancomycin (VANCOCIN) IVPB 1000 mg/200 mL premix     1,000 mg 200 mL/hr over 60 Minutes Intravenous  Once 07/21/19 2256 07/22/19 0436   07/19/19 2359  azithromycin (ZITHROMAX) 500 mg in sodium chloride 0.9 % 250 mL IVPB     500 mg 250 mL/hr over 60 Minutes Intravenous Daily at bedtime 07/19/19 2130 07/23/19 2312   07/19/19 2215  vancomycin (VANCOCIN) 2,000 mg in sodium chloride 0.9 % 500 mL IVPB  2,000 mg 250 mL/hr over 120 Minutes Intravenous  Once 07/19/19 2135 07/20/19 0134   07/19/19 2200  ceFEPIme (MAXIPIME) 2 g in sodium chloride 0.9 % 100 mL IVPB  Status:  Discontinued     2 g 200 mL/hr over 30 Minutes Intravenous Every 24 hours 07/19/19 2132 07/23/19 0908   07/19/19 2138  vancomycin variable dose per unstable renal function (pharmacist dosing)  Status:  Discontinued      Does not apply See admin instructions 07/19/19 2138 07/23/19 0908       Objective: Vitals:   08/05/19 1337 08/05/19 2016 08/05/19 2129 08/06/19 0621  BP: (!) 149/47  (!) 153/54 (!) 132/44  Pulse: 70 68 64 67  Resp: 16 18 20 20   Temp:   98.2  F (36.8 C) 98.5 F (36.9 C)  TempSrc:   Oral Oral  SpO2: 92% 94% 94% 91%  Weight:    101.5 kg  Height:        Intake/Output Summary (Last 24 hours) at 08/06/2019 1111 Last data filed at 08/06/2019 0644 Gross per 24 hour  Intake 260 ml  Output 950 ml  Net -690 ml   Filed Weights   08/03/19 0457 08/05/19 0455 08/06/19 0621  Weight: 98.8 kg 100.7 kg 101.5 kg    Examination: General exam: Appears comfortable  HEENT: PERRLA, oral mucosa moist, no sclera icterus or thrush Respiratory system: Clear to auscultation. Respiratory effort normal. Cardiovascular system: S1 & S2 heard, RRR.   Gastrointestinal system: Abdomen soft, non-tender, nondistended. Normal bowel sounds. Central nervous system: Alert and oriented. No focal neurological deficits. Extremities: No cyanosis, clubbing or edema Skin: No rashes or ulcers Psychiatry:  Mood & affect appropriate.   Data Reviewed: I have personally reviewed following labs and imaging studies  CBC: Recent Labs  Lab 08/02/19 0656 08/04/19 1051 08/06/19 0509  WBC 5.0 5.7 4.7  NEUTROABS 4.4  --   --   HGB 7.1* 8.0* 7.3*  HCT 25.9* 27.3* 24.7*  MCV 107.5* 103.8* 100.4*  PLT 83* 82* 89*   Basic Metabolic Panel: Recent Labs  Lab 08/01/19 0815 08/02/19 0232 08/03/19 0518 08/04/19 1051 08/06/19 0509  NA 158* 158* 155* 148* 144  K 4.6 4.2 3.8 3.5 3.4*  CL 113* 114* 111 108 106  CO2 33* 35* 36* 33* 31  GLUCOSE 133* 137* 98 281* 202*  BUN 92* 99* 87* 85* 75*  CREATININE 2.48* 2.44* 2.26* 1.98* 1.67*  CALCIUM 9.6 9.1 8.7* 8.3* 8.2*  MG  --   --   --  2.6*  --    GFR: Estimated Creatinine Clearance: 28.4 mL/min (A) (by C-G formula based on SCr of 1.67 mg/dL (H)). Liver Function Tests: Recent Labs  Lab 08/04/19 1051  AST 21  ALT 20  ALKPHOS 45  BILITOT 1.0  PROT 5.6*  ALBUMIN 3.0*   No results for input(s): LIPASE, AMYLASE in the last 168 hours. No results for input(s): AMMONIA in the last 168 hours. Coagulation  Profile: No results for input(s): INR, PROTIME in the last 168 hours. Cardiac Enzymes: No results for input(s): CKTOTAL, CKMB, CKMBINDEX, TROPONINI in the last 168 hours. BNP (last 3 results) No results for input(s): PROBNP in the last 8760 hours. HbA1C: No results for input(s): HGBA1C in the last 72 hours. CBG: Recent Labs  Lab 08/05/19 0450 08/05/19 0941 08/05/19 1201 08/05/19 1554 08/05/19 2123  GLUCAP 65* 96 238* 272* 295*   Lipid Profile: No results for input(s): CHOL, HDL, LDLCALC, TRIG, CHOLHDL, LDLDIRECT in the last  72 hours. Thyroid Function Tests: No results for input(s): TSH, T4TOTAL, FREET4, T3FREE, THYROIDAB in the last 72 hours. Anemia Panel: No results for input(s): VITAMINB12, FOLATE, FERRITIN, TIBC, IRON, RETICCTPCT in the last 72 hours. Urine analysis:    Component Value Date/Time   COLORURINE YELLOW 07/28/2019 [place] 07/28/2019 1615   LABSPEC 1.016 07/28/2019 1615   PHURINE 6.0 07/28/2019 1615   GLUCOSEU 50 (A) 07/28/2019 1615   HGBUR NEGATIVE 07/28/2019 1615   HGBUR negative 08/05/2010 1523   BILIRUBINUR NEGATIVE 07/28/2019 1615   BILIRUBINUR N 12/15/2016 1059   KETONESUR NEGATIVE 07/28/2019 1615   PROTEINUR NEGATIVE 07/28/2019 1615   UROBILINOGEN 0.2 12/15/2016 1059   UROBILINOGEN 0.2 08/05/2010 1523   NITRITE NEGATIVE 07/28/2019 1615   LEUKOCYTESUR NEGATIVE 07/28/2019 1615   Sepsis Labs: @LABRCNTIP (procalcitonin:4,lacticidven:4) ) Recent Results (from the past 240 hour(s))  Culture, Urine     Status: Abnormal   Collection Time: 07/27/19  3:19 PM   Specimen: Urine, Random  Result Value Ref Range Status   Specimen Description   Final    URINE, RANDOM Performed at [facility] [address] [place], [place] [address]    Special Requests   Final    NONE Performed at [facility], [place] [address], [place], [place] [address]    Culture (A)  Final    <10,000 COLONIES/mL INSIGNIFICANT  GROWTH Performed at [facility] [address], [place], [place] [address]    Report Status 07/28/2019 FINAL  Final  Culture, blood (routine x 2)     Status: None   Collection Time: 07/27/19  3:36 PM   Specimen: BLOOD  Result Value Ref Range Status   Specimen Description   Final    BLOOD LEFT ANTECUBITAL Performed at [facility] [address], [place], [place] [address]    Special Requests   Final    BOTTLES DRAWN AEROBIC AND ANAEROBIC Blood Culture adequate volume Performed at [facility] [address] [place], [place] [address]    Culture   Final    NO GROWTH 5 DAYS Performed at [facility], [place] [address] [place], [place] [address]    Report Status 08/01/2019 FINAL  Final  Culture, blood (routine x 2)     Status: None   Collection Time: 07/27/19  3:36 PM   Specimen: BLOOD  Result Value Ref Range Status   Specimen Description   Final    BLOOD LEFT ARM Performed at [facility] [address] [place], [place] [address]    Special Requests   Final    BOTTLES DRAWN AEROBIC AND ANAEROBIC Blood Culture adequate volume Performed at [facility] [address], [place], [place] [address]    Culture   Final    NO GROWTH 5 DAYS Performed at [facility], [place] [address], [place], [place] [address]    Report Status 08/01/2019 FINAL  Final  SARS CORONAVIRUS 2 (TAT 6-24 HRS) Nasopharyngeal Nasopharyngeal Swab     Status: None   Collection Time: 08/04/19 10:39 AM   Specimen: Nasopharyngeal Swab  Result Value Ref Range Status   SARS Coronavirus 2 NEGATIVE NEGATIVE Final    Comment: (NOTE) SARS-CoV-2 target nucleic acids are NOT DETECTED. The SARS-CoV-2 RNA is generally detectable in upper and lower respiratory specimens during the acute phase of infection. Negative results do not preclude SARS-CoV-2 infection, do not rule out co-infections with other pathogens,  and should not  be used as the sole basis for treatment or other patient management decisions. Negative results must be combined with clinical observations, patient history, and epidemiological information. The expected result is Negative. Fact Sheet for Patients: SugarRoll.be Fact Sheet for Healthcare Providers: https://www.woods-mathews.com/ This test is not yet approved or cleared by the [place] FDA and  has been authorized for detection and/or diagnosis of SARS-CoV-2 by FDA under an Emergency Use Authorization (EUA). This EUA will remain  in effect (meaning this test can be used) for the duration of the COVID-19 declaration under Section 56 4(b)(1) of the Act, 21 U.S.C. section 360bbb-3(b)(1), unless the authorization is terminated or revoked sooner. Performed at [facility], [place] [address] [place], [place] [address]          Radiology Studies: No results found.    Scheduled Meds:  amLODipine  2.5 mg Oral Daily   atorvastatin  40 mg Oral QHS   chlorhexidine  15 mL Mouth Rinse BID   Chlorhexidine Gluconate Cloth  6 each Topical Daily   docusate sodium  200 mg Oral QHS   feeding supplement (ENSURE ENLIVE)  237 mL Oral TID BM   feeding supplement (PRO-STAT SUGAR FREE 64)  30 mL Oral BID   fluconazole  100 mg Oral Daily   insulin aspart  0-20 Units Subcutaneous TID WC   insulin aspart  0-5 Units Subcutaneous QHS   insulin glargine  25 Units Subcutaneous QHS   levothyroxine  112 mcg Oral QAC breakfast   mouth rinse  15 mL Mouth Rinse q12n4p   nystatin cream   Topical BID   predniSONE  40 mg Oral Q breakfast   sertraline  100 mg Oral QPM   sodium chloride flush  3 mL Intravenous Q12H   Continuous Infusions:  sodium chloride       LOS: 18 days      Debbe Odea, MD [facility] Pager: [email or web address] Password [email or web address] 08/06/2019, 11:11 AM

## 2019-08-06 NOTE — Progress Notes (Signed)
Informed attending physician of 1600 CBC results.   Will alert patient and daughter to results.    [place], Therapist, sports

## 2019-08-06 NOTE — Progress Notes (Signed)
Pt refusing CPAP QHS at this time.  Pt has been educated multiple times on use of CPAP and still insist on wearing O2 instead.  RT to monitor and assess as needed.

## 2019-08-07 ENCOUNTER — Inpatient Hospital Stay (HOSPITAL_COMMUNITY): Payer: Medicare Other

## 2019-08-07 ENCOUNTER — Ambulatory Visit: Payer: Medicare Other | Admitting: Internal Medicine

## 2019-08-07 DIAGNOSIS — M62838 Other muscle spasm: Secondary | ICD-10-CM | POA: Insufficient documentation

## 2019-08-07 DIAGNOSIS — R410 Disorientation, unspecified: Secondary | ICD-10-CM

## 2019-08-07 DIAGNOSIS — Z86711 Personal history of pulmonary embolism: Secondary | ICD-10-CM | POA: Insufficient documentation

## 2019-08-07 DIAGNOSIS — K625 Hemorrhage of anus and rectum: Secondary | ICD-10-CM | POA: Insufficient documentation

## 2019-08-07 DIAGNOSIS — R509 Fever, unspecified: Secondary | ICD-10-CM | POA: Insufficient documentation

## 2019-08-07 DIAGNOSIS — E46 Unspecified protein-calorie malnutrition: Secondary | ICD-10-CM | POA: Insufficient documentation

## 2019-08-07 HISTORY — DX: Personal history of pulmonary embolism: Z86.711

## 2019-08-07 HISTORY — DX: Disorientation, unspecified: R41.0

## 2019-08-07 LAB — TYPE AND SCREEN
ABO/RH(D): A NEG
Antibody Screen: NEGATIVE
Unit division: 0

## 2019-08-07 LAB — BASIC METABOLIC PANEL
Anion gap: 9 (ref 5–15)
BUN: 65 mg/dL — ABNORMAL HIGH (ref 8–23)
CO2: 30 mmol/L (ref 22–32)
Calcium: 8.5 mg/dL — ABNORMAL LOW (ref 8.9–10.3)
Chloride: 107 mmol/L (ref 98–111)
Creatinine, Ser: 1.73 mg/dL — ABNORMAL HIGH (ref 0.44–1.00)
GFR calc Af Amer: 31 mL/min — ABNORMAL LOW (ref >60)
GFR calc non Af Amer: 27 mL/min — ABNORMAL LOW (ref >60)
Glucose, Bld: 197 mg/dL — ABNORMAL HIGH (ref 70–99)
Potassium: 3.8 mmol/L (ref 3.5–5.1)
Sodium: 146 mmol/L — ABNORMAL HIGH (ref 135–145)

## 2019-08-07 LAB — BPAM RBC
Blood Product Expiration Date: 202012142359
ISSUE DATE / TIME: 202011292348
Unit Type and Rh: 600

## 2019-08-07 LAB — CBC
HCT: 31.2 % — ABNORMAL LOW (ref 36.0–46.0)
Hemoglobin: 9.5 g/dL — ABNORMAL LOW (ref 12.0–15.0)
MCH: 30.3 pg (ref 26.0–34.0)
MCHC: 30.4 g/dL (ref 30.0–36.0)
MCV: 99.4 fL (ref 80.0–100.0)
Platelets: 110 10*3/uL — ABNORMAL LOW (ref 150–400)
RBC: 3.14 MIL/uL — ABNORMAL LOW (ref 3.87–5.11)
RDW: 21.6 % — ABNORMAL HIGH (ref 11.5–15.5)
WBC: 5.9 10*3/uL (ref 4.0–10.5)
nRBC: 0 % (ref 0.0–0.2)

## 2019-08-07 LAB — GLUCOSE, CAPILLARY
Glucose-Capillary: 188 mg/dL — ABNORMAL HIGH (ref 70–99)
Glucose-Capillary: 231 mg/dL — ABNORMAL HIGH (ref 70–99)

## 2019-08-07 MED ORDER — FUROSEMIDE 20 MG PO TABS: 20.0000 mg | ORAL_TABLET | Freq: Every day | ORAL | Status: DC

## 2019-08-07 MED ORDER — INSULIN GLARGINE 100 UNIT/ML ~~LOC~~ SOLN: 25.0000 [IU] | Freq: Every day | SUBCUTANEOUS | 11 refills | Status: DC

## 2019-08-07 MED ORDER — POTASSIUM CITRATE ER 10 MEQ (1080 MG) PO TBCR: 20.0000 meq | EXTENDED_RELEASE_TABLET | Freq: Every day | ORAL | Status: DC

## 2019-08-07 MED ORDER — AMLODIPINE BESYLATE 2.5 MG PO TABS: 2.5000 mg | ORAL_TABLET | Freq: Every day | ORAL | Status: DC

## 2019-08-07 MED ORDER — PRO-STAT SUGAR FREE PO LIQD: 30.0000 mL | Freq: Two times a day (BID) | ORAL | 0 refills | Status: DC

## 2019-08-07 MED ORDER — PREDNISONE 20 MG PO TABS: ORAL_TABLET | ORAL | Status: DC

## 2019-08-07 MED ORDER — HYDROCODONE-ACETAMINOPHEN 5-325 MG PO TABS: 1.0000 | ORAL_TABLET | Freq: Four times a day (QID) | ORAL | 0 refills | Status: DC | PRN

## 2019-08-07 MED ORDER — INSULIN ASPART 100 UNIT/ML ~~LOC~~ SOLN: 0.0000 [IU] | Freq: Three times a day (TID) | SUBCUTANEOUS | 11 refills | Status: DC

## 2019-08-07 NOTE — Progress Notes (Signed)
Attempted to call Report [phone]. No answer will follow up

## 2019-08-07 NOTE — TOC Transition Note (Signed)
Transition of Care [facility]) - CM/SW Discharge Note   Patient Details  Name: BEUNA BOLDING MRN: [id number] Date of Birth: 1935-01-29  [facility] CM/SW Contact:  Dessa Phi, RN Phone Number: 08/07/2019, 2:13 PM   Clinical Narrative:TC PTAR-going to rm [place] report for nurse to call [phone].       Final next level of care: Skilled Nursing Facility Barriers to Discharge: No Barriers Identified   Patient Goals and CMS Choice        Discharge Placement              Patient chooses bed at: ([facility]) Patient to be transferred to facility by: [place] Name of family member notified: Andres Labrum [phone] Patient and family notified of of transfer: 08/07/19  Discharge Plan and Services     Post Acute Care Choice: [place]                               Social Determinants of Health (SDOH) Interventions     Readmission Risk Interventions Readmission Risk Prevention Plan 07/20/2019  Transportation Screening Complete  PCP or Specialist Appt within 3-5 Days Not Complete  Not Complete comments unknown DC date  [place] or [place] Complete  Social Work Consult for [place] Planning/Counseling Not Complete  SW consult not completed comments following for needs  Palliative Care Screening Not Complete  Medication Review (RN Transport planner) Referral to Pharmacy  Some recent data might be hidden

## 2019-08-07 NOTE — NC FL2 (Signed)
[place] LEVEL OF CARE SCREENING TOOL     IDENTIFICATION  Patient Name: [name] Birthdate: 17-May-1935 Sex: female Admission Date (Current Location): 07/19/2019  [facility] and [place] Number:  Herbalist and Address:  [facility],  [place] [address] [place]      Provider Number: [id number]  Attending Physician Name and Address:  Debbe Odea, MD  Relative Name and Phone Number:  Andres Labrum dtr [phone]    Current Level of Care: Hospital Recommended Level of Care: [place] Prior Approval Number:    Date Approved/Denied:   PASRR Number: [id number]  Discharge Plan: SNF    Current Diagnoses: Patient Active Problem List   Diagnosis Date Noted  . Shortness of breath   . Palliative care by specialist   . Goals of care, counseling/discussion   . Pneumonitis 07/14/2019  . Pressure injury of skin 07/10/2019  . HCAP (healthcare-associated pneumonia)   . Acute hypoxemic respiratory failure ([place]) 07/03/2019  . Atypical atrial flutter ([place])   . Normocytic anemia 05/18/2017  . Chronic ITP (idiopathic thrombocytopenia) (HCC) 01/25/2017  . Chronic kidney disease (CKD) stage G4/A1, severely decreased glomerular filtration rate (GFR) between 15-29 mL/min/1.73 square meter and albuminuria creatinine ratio less than 30 mg/g (HCC) 09/04/2015  . Chronic kidney disease 09/04/2015  . Acute respiratory failure with hypoxia ([place]) 08/24/2015  . GOUT 06/23/2010  . DERMATITIS 01/16/2010  . BACK PAIN 06/13/2009  . MYALGIA 06/13/2009  . Anxiety state 12/13/2008  . LEG EDEMA, CHRONIC 11/08/2008  . Sleep apnea 10/26/2008  . Hypothyroidism 09/27/2008  . HYPERCHOLESTEROLEMIA 09/27/2008  . OBESITY 09/27/2008  . DEPRESSION 09/27/2008  . Essential hypertension 09/27/2008  . ALLERGIC RHINITIS 09/27/2008  . ASTHMA, CHILDHOOD 09/27/2008    Orientation RESPIRATION BLADDER Height & Weight     Self  O2(02 2l [place]  continuous) Indwelling catheter Weight: 101.5 kg Height:  5\' 1"  (154.9 cm)  BEHAVIORAL SYMPTOMS/MOOD NEUROLOGICAL BOWEL NUTRITION STATUS      Incontinent Diet(CHO mod)  AMBULATORY STATUS COMMUNICATION OF NEEDS Skin   Limited Assist Verbally Skin abrasions(excoriated areas-clean, w-d qd.)                       Personal Care Assistance Level of Assistance  Bathing, Feeding, Dressing Bathing Assistance: Limited assistance Feeding assistance: Limited assistance Dressing Assistance: Limited assistance Total Care Assistance: Limited assistance   Functional Limitations Info  Sight, Hearing, Speech Sight Info: Adequate Hearing Info: Adequate Speech Info: Adequate    SPECIAL CARE FACTORS FREQUENCY  PT (By licensed PT), OT (By licensed OT)     PT Frequency: 5x week OT Frequency: 5x week            Contractures Contractures Info: Not present    Additional Factors Info  Code Status, Insulin Sliding Scale, Allergies Code Status Info: DNR Allergies Info: Adhesive Tape, Apixaban, Aspirin, Mirabegron, Pineapple, Metformin, Tetracycline, Fluticasone-salmeterol, Iodinated Diagnostic Agents, Lactose Intolerance (Gi), Latex, Lisinopril, Metronidazole, Sulfa Antibiotics, Sulfonamide Derivatives   Insulin Sliding Scale Info: CBG monitoring;SSI       Current Medications (08/07/2019):  This is the current hospital active medication list Current Facility-Administered Medications  Medication Dose Route Frequency Provider Last Rate Last Dose  . 0.9 %  sodium chloride infusion  250 mL Intravenous PRN Toy Baker, MD 10 mL/hr at 08/06/19 2020 250 mL at 08/06/19 2020  . acetaminophen (TYLENOL) tablet 650 mg  650 mg Oral Q6H PRN Toy Baker, MD  650 mg at 08/02/19 0422   Or  . acetaminophen (TYLENOL) suppository 650 mg  650 mg Rectal Q6H PRN Doutova, Anastassia, MD      . albuterol (PROVENTIL) (2.5 MG/3ML) 0.083% nebulizer solution 2.5 mg  2.5 mg Nebulization Q2H PRN Doutova,  Anastassia, MD      . amLODipine (NORVASC) tablet 2.5 mg  2.5 mg Oral Daily Georgette Shell, MD   2.5 mg at 08/07/19 0915  . atorvastatin (LIPITOR) tablet 40 mg  40 mg Oral QHS Toy Baker, MD   40 mg at 08/06/19 2152  . bisacodyl (DULCOLAX) EC tablet 10 mg  10 mg Oral Q72H PRN Doutova, Anastassia, MD      . chlorhexidine (PERIDEX) 0.12 % solution 15 mL  15 mL Mouth Rinse BID Doutova, Anastassia, MD   15 mL at 08/07/19 0915  . Chlorhexidine Gluconate Cloth 2 % PADS 6 each  6 each Topical Daily Toy Baker, MD   6 each at 08/07/19 0919  . docusate sodium (COLACE) capsule 200 mg  200 mg Oral QHS Georgette Shell, MD   200 mg at 08/06/19 2152  . feeding supplement (ENSURE ENLIVE) (ENSURE ENLIVE) liquid 237 mL  237 mL Oral TID BM Georgette Shell, MD   237 mL at 08/07/19 0919  . feeding supplement (PRO-STAT SUGAR FREE 64) liquid 30 mL  30 mL Oral BID Lorella Nimrod, MD   30 mL at 08/07/19 0915  . hydrALAZINE (APRESOLINE) injection 10 mg  10 mg Intravenous Q6H PRN Lorella Nimrod, MD   10 mg at 07/24/19 1516  . HYDROcodone-acetaminophen (NORCO/VICODIN) 5-325 MG per tablet 1-2 tablet  1-2 tablet Oral Q6H PRN Georgette Shell, MD   2 tablet at 08/07/19 0506  . hyoscyamine (LEVSIN) 0.5 MG/ML injection 0.25 mg  0.25 mg Intravenous Q6H PRN Micheline Rough, MD   0.25 mg at 08/02/19 2130  . insulin aspart (novoLOG) injection 0-20 Units  0-20 Units Subcutaneous TID WC Bodenheimer, Charles A, NP   7 Units at 08/07/19 1216  . insulin aspart (novoLOG) injection 0-5 Units  0-5 Units Subcutaneous QHS Vertis Kelch, NP   3 Units at 08/06/19 2221  . insulin glargine (LANTUS) injection 25 Units  25 Units Subcutaneous QHS Georgette Shell, MD   25 Units at 08/06/19 2206  . levothyroxine (SYNTHROID) tablet 112 mcg  112 mcg Oral QAC breakfast Toy Baker, MD   112 mcg at 08/07/19 0506  . lidocaine (XYLOCAINE) 2 % jelly 1 application  1 application Topical PRN Georgette Shell, MD   1 application at 54/27/06 [phone]  . MEDLINE mouth rinse  15 mL Mouth Rinse q12n4p Doutova, Anastassia, MD   15 mL at 08/07/19 1217  . nystatin cream (MYCOSTATIN)   Topical BID Georgette Shell, MD      . ondansetron [facility]) tablet 4 mg  4 mg Oral Q6H PRN Toy Baker, MD       Or  . ondansetron (ZOFRAN) injection 4 mg  4 mg Intravenous Q6H PRN Doutova, Anastassia, MD      . oxyCODONE (Oxy IR/ROXICODONE) immediate release tablet 5 mg  5 mg Oral Q6H PRN Georgette Shell, MD   5 mg at 08/04/19 1617  . predniSONE (DELTASONE) tablet 40 mg  40 mg Oral Q breakfast Georgette Shell, MD   40 mg at 08/07/19 0731  . sertraline (ZOLOFT) tablet 100 mg  100 mg Oral QPM Doutova, Anastassia, MD   100 mg at 08/06/19 1731  .  sodium chloride (OCEAN) 0.65 % nasal spray 1 spray  1 spray Each Nare PRN Lorella Nimrod, MD      . sodium chloride flush (NS) 0.9 % injection 3 mL  3 mL Intravenous Q12H Doutova, Anastassia, MD   3 mL at 08/07/19 0919  . sodium chloride flush (NS) 0.9 % injection 3 mL  3 mL Intravenous PRN Toy Baker, MD   3 mL at 08/03/19 2236     Discharge Medications: Please see discharge summary for a list of discharge medications.  Relevant Imaging Results:  Relevant Lab Results:   Additional Information ss[address][id number], [place], [place]

## 2019-08-07 NOTE — Progress Notes (Signed)
Wound care performed (groin) and interdry replaced.

## 2019-08-07 NOTE — Discharge Summary (Signed)
Physician Discharge Summary  [name] IOM:[id number] DOB: 18-Mar-1935 DOA: 07/19/2019  PCP: Javier Glazier, MD  Admit date: 07/19/2019 Discharge date: 08/07/2019  Admitted From: SNF  Disposition:  SNF   Recommendations for Outpatient Follow-up:  1. F/u on stool occults and resume Eliquis if negative 2. Obtain Bmet and CBC on Thursday please 3. D/c Foley when perineal wounds healed 3.   Follow up with Pulmonary in 2-3 wks  Discharge Condition:  stable   CODE STATUS:  DNR   Diet recommendation:  Heart healthy with dietary supplements    Discharge Diagnoses:  Principal Problem:   Acute respiratory failure with hypoxia   Active Problems:   GI bleed   Essential hypertension   AKI on Chronic kidney disease 4   Chronic ITP (idiopathic thrombocytopenia)     Normocytic anemia    Palliative care by specialist   Goals of care, counseling/discussion   Hypothyroidism   OBESITY     Brief Summary: [name] 84 y.o.femalewith medical history significant of chronic ITP, CKD stage3, anemia of chronic disease, A.fib on Eliquis s/p cardioversion,sleep apnea on CPAP history of CHF HLD history of pulmonary embolism in 2010 diabetes type 2, HTN,hypothyroidism, pressure ulcer who presented from SNF for dyspnea. She was admitted from 10/26-11/10 to [facility] under the internal medicine teaching service. She was treated for acute respiratory failure due to pneumonitis with undetermined etiology with steroids and discharged with a steroid taper. She was on 6 L O2 at the time of discharge.  She returned to the ED on 11/11 again for respiratory failure and required a BiPAP. She was treated with steroids.   Hospital Course:  Principal Problem:   Acute respiratory failure -  hypoxic with bilateral infiltrates - Per pulm, she is not a good candidate for bronchoscopy and at this time the etiology of these infiltrates remains undetermined - she was treated essentially with  steroids (for a short period was on antibiotics but these were discontinued) - Hypoxia has improved  - she has been weaned of of BiPAP and steadily down to 2 L O2  and respiratory status is quite stable now -  she has been weaned to Prednisone 40 mg daily and Dr Elsworth Soho recommends a taper of 10mg  / wk. He will arrange outpt follow up for her.   ctive Problems: Rectal bleed -anemia  -1 episode of rectal bleed noted 11/21- has not recurred but stools were hemoccult + - Eliquis is on hold due to this, episodes of hemoptysis earlier on in the hospital stay and acute thrombocytopenia -Hemoglobin has been ~ 8-9 range but slowly drifted down to 7.3 on 11/29- no obvious blood loss noted this time (? Due to blood draws) -  given 1 U PRBC on 11/29 with Hb rise to 9.5 today - will need to cont to follow heme occults as outpt and if negative, resume Eliquis    Hypothyroidism - cont Synthroid  Chronic diastolic CHF - euvolemic at this time- cont Lasix    AKI on Chronic kidney disease 4 - suspected to be due to attempted diuresis with Lasix   - Cr has improved from a peak of 2.48 on 11/24 to 1.7 today  DM2 - cont Lantus and high dose SSI - A1c 7.3  Chronic ITP (idiopathic thrombocytopenia)  - Heme onc consulted as platelets were drifting down - she received N plate on 84/53/64 - platelets steadily rising now - of note this may be due to taking Prednisone  for respiratory illness     A- fib - underwent cardioversion on 06/26/19 - Eliquis has been held as mentioned above   OSA - cont CPAP when asleep  OF NOTE: she has severe skin breakdown in her perineum due to extended use of purewick catheters and a foley catheter was placed during this admission. She continues to have pain in perineal area and will no allow exam today. She is incontinent of stool and is often hesitant to allow RNs to clean.   Code Status: DNR Family Communication:  communication with daughter [name]  Consultants:    PCCM  Palliative care  Heme/Once   Discharge Exam: Vitals:   08/07/19 1005 08/07/19 1006  BP:    Pulse:    Resp:    Temp:    SpO2: (!) 88% 92%   Vitals:   08/07/19 0915 08/07/19 1000 08/07/19 1005 08/07/19 1006  BP: (!) 155/62     Pulse:      Resp:      Temp:      TempSrc:      SpO2:  (!) 72% (!) 88% 92%  Weight:      Height:        General: Pt is alert, awake, not in acute distress Cardiovascular: RRR, S1/S2 +, no rubs, no gallops Respiratory: CTA bilaterally, no wheezing, no rhonchi Abdominal: Soft, NT, ND, bowel sounds + Extremities: no edema, no cyanosis   Discharge Instructions  Discharge Instructions    Increase activity slowly   Complete by: As directed      Allergies as of 08/07/2019      Reactions   Adhesive [tape] Other (See Comments)   PULLS OFF THE SKIN   Apixaban Other (See Comments)   Internal Bleeding   Aspirin Itching, Rash, Hives, Swelling   Swelling of her tongue   Mirabegron Other (See Comments)   Patient experienced A-Fib   Pineapple Anaphylaxis, Swelling   Throat swells and blisters on tongue and roof of mouth per patient   Metformin Diarrhea, Nausea Only   Tetracycline Hives   Fluticasone-salmeterol Itching, Rash   Iodinated Diagnostic Agents Itching, Rash      Lactose Intolerance (gi) Other (See Comments)   Gas   Latex Itching, Rash, Other (See Comments)   Pulls off the skin and causes welts   Lisinopril Cough   Metronidazole Other (See Comments)   Reaction not known   Sulfa Antibiotics Rash   Sulfonamide Derivatives Itching, Rash      Medication List    STOP taking these medications   amoxicillin 500 MG capsule Commonly known as: AMOXIL   apixaban 2.5 MG Tabs tablet Commonly known as: ELIQUIS   Banophen 25 MG tablet Generic drug: diphenhydrAMINE   Dulaglutide 1.5 MG/0.5ML Sopn Commonly known as: Trulicity   fexofenadine 180 MG tablet Commonly known as: ALLEGRA   Flaxseed Oil 1000 MG Caps   FreeStyle  Libre 14 Day Sensor Misc   HumaLOG 100 UNIT/ML injection Generic drug: insulin lispro   Insulin Pen Needle 31G X 5 MM Misc Commonly known as: B-D UF III MINI PEN NEEDLES   INSULIN SYRINGE 1CC/30GX5/16" 30G X 5/16" 1 ML Misc     TAKE these medications   acetaminophen 325 MG tablet Commonly known as: TYLENOL Take 650 mg by mouth every 6 (six) hours as needed for moderate pain or fever (for fever or pain/discomfort).   amLODipine 2.5 MG tablet Commonly known as: NORVASC Take 1 tablet (2.5 mg total) by mouth daily. Start taking  on: August 08, 2019   atorvastatin 40 MG tablet Commonly known as: LIPITOR Take 40 mg by mouth at bedtime.   bisacodyl 5 MG EC tablet Commonly known as: DULCOLAX Take 10 mg by mouth every 3 (three) days as needed (for constipation).   docusate sodium 100 MG capsule Commonly known as: COLACE Take 100 mg by mouth daily.   feeding supplement (PRO-STAT SUGAR FREE 64) Liqd Take 30 mLs by mouth 2 (two) times daily.   fenofibrate 54 MG tablet TAKE 1 TABLET(54 MG) BY MOUTH DAILY What changed: See the new instructions.   furosemide 20 MG tablet Commonly known as: LASIX Take 1 tablet (20 mg total) by mouth daily. Take 20 mg by mouth in the morning on Sun/Tues/Thurs/Sat 40mg  on Mon/Wed/Friday What changed:   how much to take  when to take this   HYDROcodone-acetaminophen 5-325 MG tablet Commonly known as: NORCO/VICODIN Take 1 tablet by mouth every 6 (six) hours as needed for moderate pain.   insulin aspart 100 UNIT/ML injection Commonly known as: novoLOG Inject 0-20 Units into the skin 3 (three) times daily with meals. CBG 121 - 150: 3 units  CBG 151 - 200: 4 units  CBG 201 - 250: 7 units  CBG 251 - 300: 11 units  CBG 301 - 350: 15 units  CBG 351 - 400: 20 units  CBG > 400 call MD   insulin glargine 100 UNIT/ML injection Commonly known as: LANTUS Inject 0.25 mLs (25 Units total) into the skin at bedtime. What changed:   how much to  take  when to take this   levothyroxine 112 MCG tablet Commonly known as: SYNTHROID TAKE 1 TABLET BY MOUTH DAILY What changed: when to take this   multivitamin tablet Take 1 tablet by mouth daily.   omeprazole 20 MG capsule Commonly known as: PRILOSEC Take 20 mg by mouth daily.   potassium citrate 10 MEQ (1080 MG) SR tablet Commonly known as: UROCIT-K Take 2 tablets (20 mEq total) by mouth daily. What changed:   how much to take  when to take this   predniSONE 20 MG tablet Commonly known as: DELTASONE Take 40 mg daily. Taper by 10 mg every Friday. What changed:   medication strength  See the new instructions.   sertraline 100 MG tablet Commonly known as: ZOLOFT Take 100 mg by mouth every evening.   vitamin C 250 MG tablet Commonly known as: ASCORBIC ACID Take 250 mg by mouth daily.   Vitamin D3 50 MCG (2000 UT) Tabs Take 2,000 Units by mouth daily.       Allergies  Allergen Reactions  . Adhesive [Tape] Other (See Comments)    PULLS OFF THE SKIN  . Apixaban Other (See Comments)    Internal Bleeding  . Aspirin Itching, Rash, Hives and Swelling    Swelling of her tongue  . Mirabegron Other (See Comments)    Patient experienced A-Fib  . Pineapple Anaphylaxis and Swelling    Throat swells and blisters on tongue and roof of mouth per patient  . Metformin Diarrhea and Nausea Only  . Tetracycline Hives  . Fluticasone-Salmeterol Itching and Rash  . Iodinated Diagnostic Agents Itching and Rash       . Lactose Intolerance (Gi) Other (See Comments)    Gas   . Latex Itching, Rash and Other (See Comments)    Pulls off the skin and causes welts  . Lisinopril Cough  . Metronidazole Other (See Comments)    Reaction not known  .  Sulfa Antibiotics Rash  . Sulfonamide Derivatives Itching and Rash     Procedures/Studies:    Dg Chest 1 View  Result Date: 08/04/2019 CLINICAL DATA:  Acute onset of shortness of breath after being recently discharged from  the hospital after treatment for acute respiratory failure. EXAM: Portable CHEST 1 VIEW COMPARISON:  07/31/2019 and earlier. FINDINGS: Cardiac silhouette moderately enlarged, unchanged. Airspace consolidation in the LEFT LOWER LOBE, new since the most recent examination, with silhouetting of the LEFT hemidiaphragm. Minimal patchy airspace opacities medially at the RIGHT lung base, also new. Pulmonary vascularity normal without evidence of pulmonary edema currently. IMPRESSION: 1. Acute pneumonia in the LEFT lower lobe and to a lesser extent the RIGHT lung base. 2. Stable cardiomegaly without pulmonary edema currently. Electronically Signed   By: Evangeline Dakin M.D.   On: 08/04/2019 11:02   Dg Chest 1 View  Result Date: 07/28/2019 CLINICAL DATA:  Hypoxia; history pulmonary emboli, CHF, type II diabetes mellitus, hypertension EXAM: CHEST  1 VIEW COMPARISON:  Portable exam 0418 hours compared to 07/27/2019 FINDINGS: Rotated to the LEFT. Upper normal size of cardiac silhouette. Atherosclerotic calcification aorta. Diffuse BILATERAL pulmonary infiltrates, probably not significantly changed when accounting for differences in technique. Tiny bibasilar effusions. No pneumothorax. IMPRESSION: Persistent BILATERAL pulmonary infiltrates with tiny bibasilar effusions. Electronically Signed   By: Lavonia Dana M.D.   On: 07/28/2019 07:48   Dg Chest 1 View  Result Date: 07/27/2019 CLINICAL DATA:  84 year old female with shortness of breath. EXAM: CHEST  1 VIEW COMPARISON:  Chest radiograph dated 07/26/2019. FINDINGS: No significant interval change in bilateral interstitial and airspace densities and small bilateral pleural effusions likely representing CHF. Pneumonia is not excluded. Clinical correlation is recommended. Stable cardiomediastinal silhouette. Atherosclerotic calcification of the aorta. No pneumothorax. No acute osseous pathology. IMPRESSION: CHF with no significant interval change since the prior  radiograph. Follow-up recommended. Electronically Signed   By: Anner Crete M.D.   On: 07/27/2019 09:05   Dg Chest 2 View  Result Date: 07/19/2019 CLINICAL DATA:  Shortness of breath, hypoxia EXAM: CHEST - 2 VIEW COMPARISON:  07/10/2019 FINDINGS: Cardiomegaly, grossly stable. Calcific aortic knob. There are diffuse bilateral hazy airspace opacities, progressed from prior. Small bilateral pleural effusions. No pneumothorax. IMPRESSION: 1. Diffuse bilateral hazy airspace opacities have progressed since prior. Findings may represent pulmonary edema/ARDS versus multifocal pneumonia. 2. Small bilateral pleural effusions. Electronically Signed   By: Davina Poke M.D.   On: 07/19/2019 15:50   US Renal  Result Date: 07/29/2019 CLINICAL DATA:  Elevated creatinine. EXAM: RENAL / URINARY TRACT ULTRASOUND COMPLETE COMPARISON:  06/15/2017 CT FINDINGS: Right Kidney: Renal measurements: 9.3 x 5.1 x 5.9 cm = volume: 146 mL. Renal cortical thinning with increased renal echogenicity. Left Kidney: Renal measurements: 11.0 x 5.0 x 4.7 cm = volume: 134 mL. Renal cortical thinning with increased echogenicity. Suboptimally imaged secondary to patient's size and immobility. Bladder: Appears normal for degree of bladder distention. Other: Gallbladder sludge incidentally noted. Prominent spleen, suboptimally evaluated. IMPRESSION: 1.  No hydronephrosis. 2. Renal cortical thinning with increased echogenicity, suggesting medical renal disease. 3. Gallbladder sludge. 4. Probable splenomegaly. Electronically Signed   By: Abigail Miyamoto M.D.   On: 07/29/2019 08:38   Dg Chest Port 1 View  Result Date: 07/31/2019 CLINICAL DATA:  84 year old female with shortness of breath. EXAM: PORTABLE CHEST 1 VIEW COMPARISON:  07/28/2019 and earlier. FINDINGS: Portable AP upright view at 0411 hours. Stable lung volumes and mediastinal contours. Continued bilateral pulmonary interstitial opacity although ventilation  has improved since  07/27/2019. No superimposed pneumothorax or consolidation. No pleural effusion is evident. Paucity of bowel gas in the upper abdomen. Visualized tracheal air column is within normal limits. No acute osseous abnormality identified. IMPRESSION: 1. Improved bilateral ventilation since 07/27/2019 with continued widespread interstitial opacity. 2. No new cardiopulmonary abnormality. Electronically Signed   By: Genevie Ann M.D.   On: 07/31/2019 07:43   Dg Chest Port 1 View  Result Date: 07/26/2019 CLINICAL DATA:  84 year old female with acute respiratory failure. EXAM: PORTABLE CHEST 1 VIEW COMPARISON:  Chest radiograph dated 07/24/2019. FINDINGS: Interval worsening of the interstitial and airspace disease since the prior radiograph likely represent worsening CHF, pneumonia, or ARDS. Clinical correlation is recommended. Small bilateral pleural effusions as seen previously. No pneumothorax. Stable cardiomegaly. Atherosclerotic calcification of the aorta. No acute osseous pathology. IMPRESSION: 1. Interval worsening of the interstitial and airspace disease compared to the prior radiograph. 2. Stable cardiomegaly. 3. Small bilateral pleural effusions. Electronically Signed   By: Anner Crete M.D.   On: 07/26/2019 11:10   Dg Chest Port 1 View  Result Date: 07/24/2019 CLINICAL DATA:  Shortness of breath EXAM: PORTABLE CHEST 1 VIEW July 22, 2019 FINDINGS: There is cardiomegaly with pulmonary venous hypertension. There are pleural effusions bilaterally. There is airspace consolidation in each lung base region, more on the left than on the right. There is also interstitial edema which is similar to recent prior study. There is slightly less airspace opacity in the right upper lobe compared to most recent study. No new opacity is evident. There is aortic atherosclerosis.  No adenopathy.  No bone lesions. IMPRESSION: Cardiomegaly with pulmonary vascular congestion. Persistent pleural effusions. Interstitial edema is  present with airspace consolidation in the lung bases. Question alveolar edema versus pneumonia in the bases. Both entities may exist concurrently. There is less airspace opacity in the right upper lobe compared to most recent study. No new opacity evident on either side. Overall appearance is felt to be most indicative of congestive heart failure. A degree of superimposed ARDS is possible. Aortic Atherosclerosis (ICD10-I70.0). Electronically Signed   By: Lowella Grip III M.D.   On: 07/24/2019 07:57   Dg Chest Port 1 View  Result Date: 07/22/2019 CLINICAL DATA:  Shortness of breath and respiratory failure. EXAM: PORTABLE CHEST 1 VIEW COMPARISON:  07/19/2019 FINDINGS: Stable cardiac enlargement. Overall degree of diffuse pulmonary edema/ARDS appears slightly worse compared to the prior chest x-ray. No pneumothorax. Probable small bilateral pleural effusions. IMPRESSION: Slight worsening of diffuse pulmonary edema/ARDS since the prior chest x-ray. Probable small bilateral pleural effusions. Electronically Signed   By: Aletta Edouard M.D.   On: 07/22/2019 14:54   Dg Chest Port 1 View  Result Date: 07/10/2019 CLINICAL DATA:  Shortness of breath and weakness, ARDS. EXAM: PORTABLE CHEST 1 VIEW COMPARISON:  07/04/2019 FINDINGS: Lungs are somewhat hypoinflated and demonstrate persistent moderate hazy bilateral airspace opacification with some sparing of the lung apices. This process may be due to edema/ARDS or infection. Likely small amount of associated bilateral pleural fluid/basilar atelectasis. Stable cardiomegaly. Remainder of the exam is unchanged. IMPRESSION: Stable bilateral airspace process which may be due to edema/ARDS or infection. Suggestion of small stable effusions/basilar atelectasis. Stable cardiomegaly. Electronically Signed   By: Marin Olp M.D.   On: 07/10/2019 07:26     The results of significant diagnostics from this hospitalization (including imaging, microbiology, ancillary and  laboratory) are listed below for reference.     Microbiology: Recent Results (from the past 240  hour(s))  SARS CORONAVIRUS 2 (TAT 6-24 HRS) Nasopharyngeal Nasopharyngeal Swab     Status: None   Collection Time: 08/04/19 10:39 AM   Specimen: Nasopharyngeal Swab  Result Value Ref Range Status   SARS Coronavirus 2 NEGATIVE NEGATIVE Final    Comment: (NOTE) SARS-CoV-2 target nucleic acids are NOT DETECTED. The SARS-CoV-2 RNA is generally detectable in upper and lower respiratory specimens during the acute phase of infection. Negative results do not preclude SARS-CoV-2 infection, do not rule out co-infections with other pathogens, and should not be used as the sole basis for treatment or other patient management decisions. Negative results must be combined with clinical observations, patient history, and epidemiological information. The expected result is Negative. Fact Sheet for Patients: SugarRoll.be Fact Sheet for Healthcare Providers: https://www.woods-mathews.com/ This test is not yet approved or cleared by the [place] FDA and  has been authorized for detection and/or diagnosis of SARS-CoV-2 by FDA under an Emergency Use Authorization (EUA). This EUA will remain  in effect (meaning this test can be used) for the duration of the COVID-19 declaration under Section 56 4(b)(1) of the Act, 21 U.S.C. section 360bbb-3(b)(1), unless the authorization is terminated or revoked sooner. Performed at [facility], [place] [address] [place], [place] [address]      Labs: BNP (last 3 results) Recent Labs    07/03/19 1720 07/19/19 1615 07/22/19 1446  BNP 285.2* 282.1* 119.4*   Basic Metabolic Panel: Recent Labs  Lab 08/02/19 0232 08/03/19 0518 08/04/19 1051 08/06/19 0509 08/07/19 0927  NA 158* 155* 148* 144 146*  K 4.2 3.8 3.5 3.4* 3.8  CL 114* 111 108 106 107  CO2 35* 36* 33* 31 30  GLUCOSE 137* 98 281* 202* 197*  BUN 99*  87* 85* 75* 65*  CREATININE 2.44* 2.26* 1.98* 1.67* 1.73*  CALCIUM 9.1 8.7* 8.3* 8.2* 8.5*  MG  --   --  2.6*  --   --    Liver Function Tests: Recent Labs  Lab 08/04/19 1051  AST 21  ALT 20  ALKPHOS 45  BILITOT 1.0  PROT 5.6*  ALBUMIN 3.0*   No results for input(s): LIPASE, AMYLASE in the last 168 hours. No results for input(s): AMMONIA in the last 168 hours. CBC: Recent Labs  Lab 08/02/19 0656 08/04/19 1051 08/06/19 0509 08/06/19 1528 08/07/19 0450  WBC 5.0 5.7 4.7 5.1 5.9  NEUTROABS 4.4  --   --   --   --   HGB 7.1* 8.0* 7.3* 7.7* 9.5*  HCT 25.9* 27.3* 24.7* 25.4* 31.2*  MCV 107.5* 103.8* 100.4* 101.2* 99.4  PLT 83* 82* 89* 92* 110*   Cardiac Enzymes: No results for input(s): CKTOTAL, CKMB, CKMBINDEX, TROPONINI in the last 168 hours. BNP: Invalid input(s): POCBNP CBG: Recent Labs  Lab 08/05/19 2123 08/06/19 1139 08/06/19 1659 08/07/19 0742 08/07/19 1137  GLUCAP 295* 200* 273* 188* 231*   D-Dimer No results for input(s): DDIMER in the last 72 hours. Hgb A1c No results for input(s): HGBA1C in the last 72 hours. Lipid Profile No results for input(s): CHOL, HDL, LDLCALC, TRIG, CHOLHDL, LDLDIRECT in the last 72 hours. Thyroid function studies No results for input(s): TSH, T4TOTAL, T3FREE, THYROIDAB in the last 72 hours.  Invalid input(s): FREET3 Anemia work up No results for input(s): VITAMINB12, FOLATE, FERRITIN, TIBC, IRON, RETICCTPCT in the last 72 hours. Urinalysis    Component Value Date/Time   COLORURINE YELLOW 07/28/2019 [place] 07/28/2019 1615   LABSPEC 1.016 07/28/2019 1615  PHURINE 6.0 07/28/2019 1615   GLUCOSEU 50 (A) 07/28/2019 1615   HGBUR NEGATIVE 07/28/2019 1615   HGBUR negative 08/05/2010 1523   BILIRUBINUR NEGATIVE 07/28/2019 1615   BILIRUBINUR N 12/15/2016 1059   KETONESUR NEGATIVE 07/28/2019 1615   PROTEINUR NEGATIVE 07/28/2019 1615   UROBILINOGEN 0.2 12/15/2016 1059   UROBILINOGEN 0.2 08/05/2010 1523    NITRITE NEGATIVE 07/28/2019 1615   LEUKOCYTESUR NEGATIVE 07/28/2019 1615   Sepsis Labs Invalid input(s): PROCALCITONIN,  WBC,  LACTICIDVEN Microbiology Recent Results (from the past 240 hour(s))  SARS CORONAVIRUS 2 (TAT 6-24 HRS) Nasopharyngeal Nasopharyngeal Swab     Status: None   Collection Time: 08/04/19 10:39 AM   Specimen: Nasopharyngeal Swab  Result Value Ref Range Status   SARS Coronavirus 2 NEGATIVE NEGATIVE Final    Comment: (NOTE) SARS-CoV-2 target nucleic acids are NOT DETECTED. The SARS-CoV-2 RNA is generally detectable in upper and lower respiratory specimens during the acute phase of infection. Negative results do not preclude SARS-CoV-2 infection, do not rule out co-infections with other pathogens, and should not be used as the sole basis for treatment or other patient management decisions. Negative results must be combined with clinical observations, patient history, and epidemiological information. The expected result is Negative. Fact Sheet for Patients: SugarRoll.be Fact Sheet for Healthcare Providers: https://www.woods-mathews.com/ This test is not yet approved or cleared by the [place] FDA and  has been authorized for detection and/or diagnosis of SARS-CoV-2 by FDA under an Emergency Use Authorization (EUA). This EUA will remain  in effect (meaning this test can be used) for the duration of the COVID-19 declaration under Section 56 4(b)(1) of the Act, 21 U.S.C. section 360bbb-3(b)(1), unless the authorization is terminated or revoked sooner. Performed at [facility], [place] [address] [place], [place] [address]      Time coordinating discharge in minutes: 38  SIGNED:   Debbe Odea, MD  [facility] 08/07/2019, 1:17 PM Pager   If 7PM-7AM, please contact night-coverage [email or web address] Password [email or web address]

## 2019-08-07 NOTE — TOC Transition Note (Signed)
Transition of Care [facility]) - CM/SW Discharge Note   Patient Details  Name: [name] MRN: [id number] Date of Birth: 02-10-1935  Transition of Care [facility]) CM/SW Contact:  Dessa Phi, RN Phone Number: 08/07/2019, 12:19 PM   Clinical Narrative: d/c back to [facility] rep Luellen Pucker able to accept. covid 11/27 neg. Will fax d/c summary & wait for rm# prior to calling [facility]-has 02,f/c.MD/Nsg aware.      Final next level of care: Skilled Nursing Facility Barriers to Discharge: No Barriers Identified   Patient Goals and CMS Choice        Discharge Placement              Patient chooses bed at: ([facility]) Patient to be transferred to facility by: [place] Name of family member notified: [name] [phone] Patient and family notified of of transfer: 08/07/19  Discharge Plan and Services     Post Acute Care Choice: [place]                               Social Determinants of Health (SDOH) Interventions     Readmission Risk Interventions Readmission Risk Prevention Plan 07/20/2019  Transportation Screening Complete  PCP or Specialist Appt within 3-5 Days Not Complete  Not Complete comments unknown DC date  [place] or [place] Complete  Social Work Consult for [place] Planning/Counseling Not Complete  SW consult not completed comments following for needs  Palliative Care Screening Not Complete  Medication Review (RN Transport planner) Referral to Pharmacy  Some recent data might be hidden

## 2019-08-07 NOTE — Care Management Important Message (Signed)
Important Message  Patient Details IM Letter given to Dessa Phi RN to present to the Patient Name: [name] MRN: [id number] Date of Birth: 14-Oct-1934   Medicare Important Message Given:  Yes     Kerin Salen 08/07/2019, 12:46 PM

## 2019-08-08 DIAGNOSIS — I2510 Atherosclerotic heart disease of native coronary artery without angina pectoris: Secondary | ICD-10-CM | POA: Diagnosis not present

## 2019-08-08 DIAGNOSIS — N949 Unspecified condition associated with female genital organs and menstrual cycle: Secondary | ICD-10-CM | POA: Diagnosis not present

## 2019-08-08 DIAGNOSIS — N184 Chronic kidney disease, stage 4 (severe): Secondary | ICD-10-CM | POA: Diagnosis not present

## 2019-08-08 DIAGNOSIS — J9600 Acute respiratory failure, unspecified whether with hypoxia or hypercapnia: Secondary | ICD-10-CM | POA: Diagnosis not present

## 2019-08-08 DIAGNOSIS — J189 Pneumonia, unspecified organism: Secondary | ICD-10-CM | POA: Diagnosis not present

## 2019-08-08 LAB — GLUCOSE, CAPILLARY
Glucose-Capillary: 86 mg/dL (ref 70–99)
Glucose-Capillary: 68 mg/dL — ABNORMAL LOW (ref 70–99)
Glucose-Capillary: 171 mg/dL — ABNORMAL HIGH (ref 70–99)
Glucose-Capillary: 162 mg/dL — ABNORMAL HIGH (ref 70–99)
Glucose-Capillary: 259 mg/dL — ABNORMAL HIGH (ref 70–99)

## 2019-08-09 ENCOUNTER — Telehealth: Payer: Self-pay

## 2019-08-09 NOTE — Telephone Encounter (Signed)
If she is eating well, she can resume Trulicity.  She probably also needs Lantus, too, I would start with 30 units daily. I will need to see her whenever convenient for them for further adjustments.  We can do a virtual visit but I need to make sure that at the time of the visit they are prepared to give me the exact medication regimen she is taking including exact insulin doses) and also her sugars at different times of the day.

## 2019-08-09 NOTE — Telephone Encounter (Signed)
Patients daughter called in with concerns about moms blood sugars.she was in hospital for 5 weeks and they took her off Trulcity and has been off of it for 1 month and she's also been off Lantus (long active at night) her numbers have been as high as 500 throughout the day and 70 early morning. She is in a assist living right now. Daughter wants her to have a shot today thinks that will help regulated her sugars back.   Please call for more clarify and advise    I also afford daughter to send patient her CPU so she can maybe do a virtual visit    Daughter also says patient is on a steroid right now. And has been on it for 5 weeks and also have been intervenous steroids for 2 weeks

## 2019-08-09 NOTE — Telephone Encounter (Signed)
Ok - may still need Lantus. Depending on the sugars, can start with a lower dose. It is difficult for me to tell w/o sugars.Marland KitchenMarland Kitchen

## 2019-08-09 NOTE — Telephone Encounter (Signed)
Patient daughter says she is not eating well so will hold off on the Trulicity. They are hoping her blood sugars will come down after she is done with steroids and other issues.

## 2019-08-10 ENCOUNTER — Telehealth: Payer: Self-pay

## 2019-08-10 NOTE — Telephone Encounter (Signed)
Dr. Cruzita Lederer has spoken with RN at patient's care home.

## 2019-08-10 NOTE — Telephone Encounter (Signed)
Made in error

## 2019-08-15 ENCOUNTER — Telehealth: Payer: Self-pay | Admitting: Internal Medicine

## 2019-08-15 ENCOUNTER — Telehealth: Payer: Self-pay

## 2019-08-15 NOTE — Telephone Encounter (Signed)
[facility] NP Elenore Rota Lotharp called regarding this patient States that they are trying to get stat orders for patient who was released from hospital - 8 days ago.  Patient has been on hourly blood sugar sticks/insulin since discharge from hospital   NP can be reached at cell [phone] and office [phone]

## 2019-08-15 NOTE — Telephone Encounter (Signed)
After hours nurse called.  States [place] called from [facility] at [facility] - to get clarification on blood sugar checks.  Called and spoke to [place].  Pt admitted 08/05/19 and has been getting hourly sugar checks since admission.  Calling to get order clarification.  Has faxed over blood sugar readings.  Reviewed phone message.  Per note, Dr Renne Crigler to review sugar readings.  Discussed with [place].  Explained I would forward information to Dr Renne Crigler - to get a clarification on the order tomorrow.  States pt is doing fine and not having any acute concerns, just wanted to see if they could decrease blood sugar checks.

## 2019-08-15 NOTE — Telephone Encounter (Signed)
NP called from [facility] wanting new orders from Dr.   Please call and advise  [phone]

## 2019-08-15 NOTE — Telephone Encounter (Signed)
Returned call and asked for [facility] and glucose log faxed to me at [phone].  Awaiting fax.

## 2019-08-16 ENCOUNTER — Telehealth: Payer: Self-pay | Admitting: Internal Medicine

## 2019-08-16 NOTE — Telephone Encounter (Signed)
[name] with [facility] called back to speak to [facility]

## 2019-08-16 NOTE — Telephone Encounter (Signed)
Ye! 4x a day - before meals and at bedtime.

## 2019-08-16 NOTE — Telephone Encounter (Signed)
See other note

## 2019-08-16 NOTE — Telephone Encounter (Signed)
I have not yet received the MAR or glucose readings from the facility. Can we just fax and order to them to stop checking the blood sugar every hour and give them directions on how many to check?

## 2019-08-16 NOTE — Telephone Encounter (Signed)
Julious Oka with [facility] with Dr. Antony Salmon' s office ph# [phone] or cell# [phone] requests to be called asap re: clarification of patient's glucose (getting hourly checks since 08/07/19). The above contact has been calling since last Friday and states she has not received a return call re: if the fax she sent was received by our office. The above contact also requests that the Office Manager call her at the ph# listed above.

## 2019-08-16 NOTE — Telephone Encounter (Signed)
Please refer to the already open phone encounter regarding this issue  They were to fax the [facility] and blood sugar numbers to [place] for the doctor to review and as of today still have NOT received them.

## 2019-08-16 NOTE — Telephone Encounter (Signed)
Julious Oka with [facility] with Dr. Antony Salmon' s office requests the Office Manager call her at ph# [phone] or cell# [phone] re: problems she is having with our office.

## 2019-08-16 NOTE — Telephone Encounter (Signed)
Orders faxed to facility.

## 2019-08-16 NOTE — Telephone Encounter (Signed)
[name], Have we received this? I discussed with the facility several days ago (over the phone - cannot remember the name- Caren Griffins?) about stopping the blood sugar checks at night at least and continue only before meals and bedtime. C

## 2019-08-17 ENCOUNTER — Encounter: Payer: Self-pay | Admitting: Internal Medicine

## 2019-08-17 NOTE — Progress Notes (Signed)
I have printed this and faxed to facility with confirmation.

## 2019-08-17 NOTE — Telephone Encounter (Signed)
Orders to stop hourly blood sugar check was faxed yesterday at 3:49 PM and start 4 times a day.

## 2019-08-17 NOTE — Progress Notes (Signed)
Finally received blood sugar logs as a part of MAR from [facility] at [facility] facility: -Patient is on Lantus 25 units at night -NovoLog sliding scale every hour  I have discussed with Caren Griffins several days ago about stopping q1h blood sugar checks, but this has not been done yet.  We sent another faxed order yesterday to stop hourly sugar checks and switch to only checking 3 times a day before meals + at bedtime.  At this visit, she is given between 3 to 7 units of insulin in the morning hours, 7 to 20 units around lunchtime and before dinner, and 3 to 15 units in the evening hours.  CBGs: - am: 87-212 - After breakfast: 156-220 - Before lunch: 135, 213-342 - 5 PM: 102, 138-291, 354 - 8 PM: 161-330 - 11 PM: 127-307 Sugar increase as the day goes by, a sign of not enough mealtime insulin.  I reviewed her previous regimen at our last visit: - Lantus 40 units 2x a day: before b'fast and at bedtime - Trulicity 1.5 mg weekly - Humalog  35 units before breakfast 30 units before lunch 25 units before dinner If sugars >90, give the full dose for a regular meal If sugars 60-90, give  17 units before breakfast 15 units before lunch 12 units before dinner If sugars <60, hold Humalog with the meal  At this visit, I would suggest the following regimen: - Lantus 20 units 2x a day - NovoLog 12 units before breakfast-lunch-dinner - NovoLog sliding scale:  - 150-175: + 1 unit  - 176-200: + 2 units  - 201-225: + 3 units  - 226-250: + 4 units  - 251-275: + 5 units - 276-300: + 6 units >300: +7 units Call MD if sugars are above 300s consistently.  I cannot see in the records sent from the facility if she is still taking Trulicity.  I would like to obtain this information.

## 2019-08-22 ENCOUNTER — Telehealth: Payer: Self-pay | Admitting: Pulmonary Disease

## 2019-08-22 NOTE — Telephone Encounter (Signed)
Called and left message on pt vm to call back to schedule hospital f/u with APP -pr

## 2019-08-22 NOTE — Telephone Encounter (Signed)
-----   Message from Rigoberto Noel, MD sent at 08/08/2019  3:14 PM EST ----- Please make hosp FU appt with APP in 3-4 weeks  RA ----- Message ----- From: Debbe Odea, MD Sent: 08/07/2019   5:32 PM EST To: Rigoberto Noel, MD  Hello,  You asked me to send to a message to remind you to make a follow up appt for this patient.  [name]

## 2019-08-24 NOTE — Telephone Encounter (Signed)
Called and left message on pt vm to call back to schedule hospital f/u with APP -pr

## 2019-08-28 NOTE — Telephone Encounter (Signed)
Called and left message on pt vm to call back to schedule hospital f/u with an APP - 3rd attempt - no response -pr

## 2019-08-29 NOTE — Telephone Encounter (Signed)
S/W daughter [name] to schedule follow-up since contact numbers in patient's chart had memory full on home number and left a message on voice mail on cell #.  Pt is bedridden at SNF.  Recommended that we could do a phone visit with NP.  Pt's daughter will contact her to see about appt and call us back. ta

## 2019-08-29 NOTE — Telephone Encounter (Signed)
This patient was discharged to SNF. Please contact her daughter for appointments

## 2019-08-30 NOTE — Telephone Encounter (Signed)
We have reached to patient and her daughter to schedule 3 times, no response as of yet.  ta

## 2019-09-12 ENCOUNTER — Other Ambulatory Visit: Payer: Self-pay | Admitting: *Deleted

## 2019-09-12 ENCOUNTER — Encounter: Payer: Self-pay | Admitting: *Deleted

## 2019-09-12 NOTE — Progress Notes (Signed)
Faxed lab orders to [facility]. Fax number is [phone]. CBC and CMET per Dr. Marin Olp. Left a message with Duwayne Heck, social worker, at [phone], extension [phone].

## 2019-09-21 ENCOUNTER — Telehealth: Payer: Self-pay | Admitting: Hematology & Oncology

## 2019-09-21 ENCOUNTER — Other Ambulatory Visit: Payer: Self-pay | Admitting: *Deleted

## 2019-09-21 DIAGNOSIS — D500 Iron deficiency anemia secondary to blood loss (chronic): Secondary | ICD-10-CM

## 2019-09-21 DIAGNOSIS — D696 Thrombocytopenia, unspecified: Secondary | ICD-10-CM

## 2019-09-21 DIAGNOSIS — N184 Chronic kidney disease, stage 4 (severe): Secondary | ICD-10-CM

## 2019-09-21 DIAGNOSIS — D693 Immune thrombocytopenic purpura: Secondary | ICD-10-CM

## 2019-09-21 NOTE — Telephone Encounter (Signed)
Spoke with patient to confirm 1/20 lab/ov and blood transfusion 1/21 per 1/14 sch msg

## 2019-09-27 ENCOUNTER — Other Ambulatory Visit: Payer: Medicare Other

## 2019-09-27 ENCOUNTER — Ambulatory Visit: Payer: Medicare Other

## 2019-09-27 ENCOUNTER — Ambulatory Visit: Payer: Medicare Other | Admitting: Family

## 2019-09-28 ENCOUNTER — Emergency Department (HOSPITAL_COMMUNITY)
Admission: EM | Admit: 2019-09-28 | Discharge: 2019-09-28 | Disposition: A | Discharge status: Skilled Nursing Facility | Payer: Medicare Other | Attending: Emergency Medicine | Admitting: Emergency Medicine

## 2019-09-28 ENCOUNTER — Emergency Department (HOSPITAL_COMMUNITY): Payer: Medicare Other

## 2019-09-28 ENCOUNTER — Other Ambulatory Visit: Payer: Self-pay

## 2019-09-28 DIAGNOSIS — Z7984 Long term (current) use of oral hypoglycemic drugs: Secondary | ICD-10-CM | POA: Diagnosis not present

## 2019-09-28 DIAGNOSIS — Z79899 Other long term (current) drug therapy: Secondary | ICD-10-CM | POA: Insufficient documentation

## 2019-09-28 DIAGNOSIS — Y929 Unspecified place or not applicable: Secondary | ICD-10-CM | POA: Insufficient documentation

## 2019-09-28 DIAGNOSIS — Y69 Unspecified misadventure during surgical and medical care: Secondary | ICD-10-CM | POA: Insufficient documentation

## 2019-09-28 DIAGNOSIS — I509 Heart failure, unspecified: Secondary | ICD-10-CM | POA: Diagnosis not present

## 2019-09-28 DIAGNOSIS — R7989 Other specified abnormal findings of blood chemistry: Secondary | ICD-10-CM | POA: Insufficient documentation

## 2019-09-28 DIAGNOSIS — S82831A Other fracture of upper and lower end of right fibula, initial encounter for closed fracture: Secondary | ICD-10-CM

## 2019-09-28 DIAGNOSIS — M25571 Pain in right ankle and joints of right foot: Secondary | ICD-10-CM | POA: Diagnosis not present

## 2019-09-28 DIAGNOSIS — R1031 Right lower quadrant pain: Secondary | ICD-10-CM | POA: Diagnosis present

## 2019-09-28 DIAGNOSIS — K8020 Calculus of gallbladder without cholecystitis without obstruction: Secondary | ICD-10-CM | POA: Diagnosis not present

## 2019-09-28 DIAGNOSIS — T83511A Infection and inflammatory reaction due to indwelling urethral catheter, initial encounter: Secondary | ICD-10-CM | POA: Insufficient documentation

## 2019-09-28 DIAGNOSIS — I110 Hypertensive heart disease with heart failure: Secondary | ICD-10-CM | POA: Diagnosis not present

## 2019-09-28 DIAGNOSIS — Z87442 Personal history of urinary calculi: Secondary | ICD-10-CM | POA: Diagnosis not present

## 2019-09-28 DIAGNOSIS — R1011 Right upper quadrant pain: Secondary | ICD-10-CM | POA: Diagnosis not present

## 2019-09-28 DIAGNOSIS — T83511D Infection and inflammatory reaction due to indwelling urethral catheter, subsequent encounter: Secondary | ICD-10-CM | POA: Insufficient documentation

## 2019-09-28 DIAGNOSIS — Y999 Unspecified external cause status: Secondary | ICD-10-CM | POA: Diagnosis not present

## 2019-09-28 DIAGNOSIS — S8264XD Nondisplaced fracture of lateral malleolus of right fibula, subsequent encounter for closed fracture with routine healing: Secondary | ICD-10-CM | POA: Insufficient documentation

## 2019-09-28 DIAGNOSIS — Y9389 Activity, other specified: Secondary | ICD-10-CM | POA: Diagnosis not present

## 2019-09-28 DIAGNOSIS — X58XXXA Exposure to other specified factors, initial encounter: Secondary | ICD-10-CM | POA: Insufficient documentation

## 2019-09-28 DIAGNOSIS — E119 Type 2 diabetes mellitus without complications: Secondary | ICD-10-CM | POA: Diagnosis not present

## 2019-09-28 DIAGNOSIS — N390 Urinary tract infection, site not specified: Secondary | ICD-10-CM

## 2019-09-28 LAB — URINALYSIS, ROUTINE W REFLEX MICROSCOPIC
Bilirubin Urine: NEGATIVE
Glucose, UA: NEGATIVE mg/dL
Hgb urine dipstick: NEGATIVE
Ketones, ur: NEGATIVE mg/dL
Nitrite: POSITIVE — AB
Protein, ur: NEGATIVE mg/dL
Specific Gravity, Urine: 1.009 (ref 1.005–1.030)
WBC, UA: >50 WBC/hpf — ABNORMAL HIGH (ref 0–5)
pH: 6 (ref 5.0–8.0)

## 2019-09-28 LAB — BASIC METABOLIC PANEL
Anion gap: 13 (ref 5–15)
BUN: 37 mg/dL — ABNORMAL HIGH (ref 8–23)
CO2: 32 mmol/L (ref 22–32)
Calcium: 8.5 mg/dL — ABNORMAL LOW (ref 8.9–10.3)
Chloride: 95 mmol/L — ABNORMAL LOW (ref 98–111)
Creatinine, Ser: 1.61 mg/dL — ABNORMAL HIGH (ref 0.44–1.00)
GFR calc Af Amer: 34 mL/min — ABNORMAL LOW (ref >60)
GFR calc non Af Amer: 29 mL/min — ABNORMAL LOW (ref >60)
Glucose, Bld: 149 mg/dL — ABNORMAL HIGH (ref 70–99)
Potassium: 3.1 mmol/L — ABNORMAL LOW (ref 3.5–5.1)
Sodium: 140 mmol/L (ref 135–145)

## 2019-09-28 LAB — CBC WITH DIFFERENTIAL/PLATELET
Abs Immature Granulocytes: 0.04 10*3/uL (ref 0.00–0.07)
Basophils Absolute: 0 10*3/uL (ref 0.0–0.1)
Basophils Relative: 0 %
Eosinophils Absolute: 0.1 10*3/uL (ref 0.0–0.5)
Eosinophils Relative: 3 %
HCT: 30.6 % — ABNORMAL LOW (ref 36.0–46.0)
Hemoglobin: 9.5 g/dL — ABNORMAL LOW (ref 12.0–15.0)
Immature Granulocytes: 1 %
Lymphocytes Relative: 9 %
Lymphs Abs: 0.5 10*3/uL — ABNORMAL LOW (ref 0.7–4.0)
MCH: 29.1 pg (ref 26.0–34.0)
MCHC: 31 g/dL (ref 30.0–36.0)
MCV: 93.6 fL (ref 80.0–100.0)
Monocytes Absolute: 0.5 10*3/uL (ref 0.1–1.0)
Monocytes Relative: 10 %
Neutro Abs: 4.1 10*3/uL (ref 1.7–7.7)
Neutrophils Relative %: 77 %
Platelets: 113 10*3/uL — ABNORMAL LOW (ref 150–400)
RBC: 3.27 MIL/uL — ABNORMAL LOW (ref 3.87–5.11)
RDW: 17.9 % — ABNORMAL HIGH (ref 11.5–15.5)
WBC: 5.2 10*3/uL (ref 4.0–10.5)
nRBC: 0 % (ref 0.0–0.2)

## 2019-09-28 LAB — HEPATIC FUNCTION PANEL
ALT: 52 U/L — ABNORMAL HIGH (ref 0–44)
AST: 54 U/L — ABNORMAL HIGH (ref 15–41)
Albumin: 2.5 g/dL — ABNORMAL LOW (ref 3.5–5.0)
Alkaline Phosphatase: 161 U/L — ABNORMAL HIGH (ref 38–126)
Bilirubin, Direct: 1.3 mg/dL — ABNORMAL HIGH (ref 0.0–0.2)
Indirect Bilirubin: 1.2 mg/dL — ABNORMAL HIGH (ref 0.3–0.9)
Total Bilirubin: 2.5 mg/dL — ABNORMAL HIGH (ref 0.3–1.2)
Total Protein: 6.7 g/dL (ref 6.5–8.1)

## 2019-09-28 LAB — CBG MONITORING, ED: Glucose-Capillary: 141 mg/dL — ABNORMAL HIGH (ref 70–99)

## 2019-09-28 LAB — LIPASE, BLOOD: Lipase: 28 U/L (ref 11–51)

## 2019-09-28 MED ORDER — SODIUM CHLORIDE 0.9 % IV SOLN
1.0000 g | Freq: Once | INTRAVENOUS | Status: AC
  Administered 2019-09-28: 1 g via INTRAVENOUS
  Filled 2019-09-28: qty 10

## 2019-09-28 MED ORDER — ONDANSETRON HCL 4 MG/2ML IJ SOLN
4.0000 mg | Freq: Once | INTRAMUSCULAR | Status: AC
  Administered 2019-09-28: 4 mg via INTRAVENOUS
  Filled 2019-09-28: qty 2

## 2019-09-28 MED ORDER — FENTANYL CITRATE (PF) 100 MCG/2ML IJ SOLN
100.0000 ug | Freq: Once | INTRAMUSCULAR | Status: AC
  Administered 2019-09-28: 100 ug via INTRAVENOUS
  Filled 2019-09-28: qty 2

## 2019-09-28 NOTE — Consult Note (Signed)
[facility] Surgery Consult Note  [name] 1935-01-06  [id number].    Requesting MD: Melina Copa Chief Complaint/Reason for Consult: cholelithiasis HPI:  Patient is an 85 year old female who presented to [facility] from [facility] with abdominal pain and right ankle pain. Abdominal pain has been intermittent and in the RUQ. Severe when it occurs but resolves within minutes without intervention. Patient reports no radiation of pain. Some associated nausea but no vomiting. Patient has not noted any association with eating but has not really thought about it. Denies fever, chills, chest pain, increased SOB, diarrhea. PMH significant for HTN, CKD stage IV, Chronic ITP, Hypothyroidism, T2DM, morbid obesity. She is basically bedbound at facility. No past abdominal surgery. Patient would like to try to avoid any surgery if possible. No blood thinning medications. Patient reports pain has resolved and she would like to go home and follow up with surgery as needed if her symptoms worsen. Discussed with patient's daughters on the phone as well.   ROS: Review of Systems  Constitutional: Negative for chills and fever.  Respiratory: Negative for shortness of breath and wheezing.   Cardiovascular: Negative for chest pain and palpitations.  Gastrointestinal: Positive for abdominal pain and nausea. Negative for constipation, diarrhea and vomiting.  Genitourinary:       Foley present  Musculoskeletal: Positive for joint pain (R ankle).  All other systems reviewed and are negative.   Family History  Problem Relation Age of Onset  . Allergies Mother   . Clotting disorder Mother   . Osteoarthritis Mother   . Asthma Mother   . Arthritis Other   . Diabetes Other   . Hyperlipidemia Other   . Hypertension Other   . Coronary artery disease Other   . Stroke Other   . Osteoarthritis Daughter   . Rheum arthritis Maternal Grandmother   . Clotting disorder Maternal Grandmother   . Clotting disorder  Maternal Uncle   . Clotting disorder Daughter   . Allergies Daughter   . Breast cancer Neg Hx     Past Medical History:  Diagnosis Date  . Allergic rhinitis   . Anxiety state, unspecified    panic attacks  . CHF (congestive heart failure) ([place])   . Depressive disorder, not elsewhere classified   . Extrinsic asthma, unspecified    no problem since adulthood  . Obesity   . OSA on CPAP    severe  . Pure hypercholesterolemia   . Respiratory failure with hypoxia ([place]) 09/2008   acute, secondary to multiple bilateral pulmonary embolism , negative hypercoagulable workup 09/2008 hospital stay  . Scoliosis   . Type II or unspecified type diabetes mellitus without mention of complication, not stated as uncontrolled   . Unspecified essential hypertension   . Unspecified hypothyroidism    hypo    Past Surgical History:  Procedure Laterality Date  . CARDIOVERSION N/A 06/26/2019   Procedure: CARDIOVERSION;  Surgeon: Jerline Pain, MD;  Location: [facility] ENDOSCOPY;  Service: Cardiovascular;  Laterality: N/A;  . EYE SURGERY    . JOINT REPLACEMENT    . knee replaced    . THYROID SURGERY      Social History:  reports that she quit smoking about 30 years ago. Her smoking use included cigarettes. She has a 5.00 pack-year smoking history. She has never used smokeless tobacco. She reports that she does not drink alcohol or use drugs.  Allergies:  Allergies  Allergen Reactions  . Adhesive [Tape] Other (See Comments)  PULLS OFF THE SKIN  . Apixaban Other (See Comments)    Internal Bleeding  . Aspirin Itching, Rash, Hives and Swelling    Swelling of her tongue  . Mirabegron Other (See Comments)    Patient experienced A-Fib  . Pineapple Anaphylaxis and Swelling    Throat swells and blisters on tongue and roof of mouth per patient  . Metformin Diarrhea and Nausea Only  . Tetracycline Hives  . Fluticasone-Salmeterol Itching and Rash  . Iodinated Diagnostic Agents Itching and Rash       .  Lactose Intolerance (Gi) Other (See Comments)    Gas   . Latex Itching, Rash and Other (See Comments)    Pulls off the skin and causes welts  . Lisinopril Cough  . Metronidazole Other (See Comments)    Reaction not known  . Sulfa Antibiotics Rash  . Sulfonamide Derivatives Itching and Rash    (Not in a hospital admission)   Blood pressure 105/66, pulse 70, temperature 98.2 F (36.8 C), temperature source Oral, resp. rate 15, SpO2 94 %. Physical Exam: Physical Exam Constitutional:      General: She is not in acute distress.    Appearance: She is well-developed. She is obese. She is not toxic-appearing.  HENT:     Head: Normocephalic and atraumatic.     Mouth/Throat:     Mouth: Mucous membranes are moist.     Pharynx: Oropharynx is clear.  Eyes:     General: No scleral icterus.    Extraocular Movements: Extraocular movements intact.     Pupils: Pupils are equal, round, and reactive to light.  Cardiovascular:     Rate and Rhythm: Normal rate and regular rhythm.     Heart sounds: Normal heart sounds.  Pulmonary:     Effort: Pulmonary effort is normal.     Breath sounds: Normal breath sounds.  Abdominal:     General: Bowel sounds are normal. There is no distension.     Palpations: Abdomen is soft. There is no hepatomegaly or splenomegaly.     Tenderness: There is no abdominal tenderness. There is no guarding or rebound. Negative signs include Murphy's sign.  Skin:    General: Skin is warm and dry.     Capillary Refill: Capillary refill takes less than 2 seconds.     Coloration: Skin is not jaundiced.     Findings: No rash.  Neurological:     General: No focal deficit present.     Mental Status: She is alert and oriented to person, place, and time.  Psychiatric:        Mood and Affect: Mood normal.        Behavior: Behavior normal.     Results for orders placed or performed during the hospital encounter of 09/28/19 (from the past 48 hour(s))  Basic metabolic panel      Status: Abnormal   Collection Time: 09/28/19  6:21 AM  Result Value Ref Range   Sodium 140 135 - 145 mmol/L   Potassium 3.1 (L) 3.5 - 5.1 mmol/L   Chloride 95 (L) 98 - 111 mmol/L   CO2 32 22 - 32 mmol/L   Glucose, Bld 149 (H) 70 - 99 mg/dL   BUN 37 (H) 8 - 23 mg/dL   Creatinine, Ser 1.61 (H) 0.44 - 1.00 mg/dL   Calcium 8.5 (L) 8.9 - 10.3 mg/dL   GFR calc non Af Amer 29 (L) >60 mL/min   GFR calc Af Amer 34 (L) >60 mL/min  Anion gap 13 5 - 15    Comment: Performed at [facility], [place] [address] [place], [place] [address]  Urinalysis, Routine w reflex microscopic     Status: Abnormal   Collection Time: 09/28/19  6:21 AM  Result Value Ref Range   Color, Urine YELLOW YELLOW   APPearance HAZY (A) CLEAR   Specific Gravity, Urine 1.009 1.005 - 1.030   pH 6.0 5.0 - 8.0   Glucose, UA NEGATIVE NEGATIVE mg/dL   Hgb urine dipstick NEGATIVE NEGATIVE   Bilirubin Urine NEGATIVE NEGATIVE   Ketones, ur NEGATIVE NEGATIVE mg/dL   Protein, ur NEGATIVE NEGATIVE mg/dL   Nitrite POSITIVE (A) NEGATIVE   Leukocytes,Ua LARGE (A) NEGATIVE   RBC / HPF 0-5 0 - 5 RBC/hpf   WBC, UA >50 (H) 0 - 5 WBC/hpf   Bacteria, UA RARE (A) NONE SEEN   WBC Clumps PRESENT    Budding Yeast PRESENT    Ca Oxalate Crys, UA PRESENT     Comment: Performed at [facility], [place] [address], [place], [place] [address]  CBC with Differential/Platelet     Status: Abnormal   Collection Time: 09/28/19  6:21 AM  Result Value Ref Range   WBC 5.2 4.0 - 10.5 K/uL   RBC 3.27 (L) 3.87 - 5.11 MIL/uL   Hemoglobin 9.5 (L) 12.0 - 15.0 g/dL   HCT 30.6 (L) 36.0 - 46.0 %   MCV 93.6 80.0 - 100.0 fL   MCH 29.1 26.0 - 34.0 pg   MCHC 31.0 30.0 - 36.0 g/dL   RDW 17.9 (H) 11.5 - 15.5 %   Platelets 113 (L) 150 - 400 K/uL    Comment: Immature Platelet Fraction may be clinically indicated, consider ordering this additional test [id number] CONSISTENT WITH PREVIOUS RESULT    nRBC 0.0 0.0 - 0.2 %    Neutrophils Relative % 77 %   Neutro Abs 4.1 1.7 - 7.7 K/uL   Lymphocytes Relative 9 %   Lymphs Abs 0.5 (L) 0.7 - 4.0 K/uL   Monocytes Relative 10 %   Monocytes Absolute 0.5 0.1 - 1.0 K/uL   Eosinophils Relative 3 %   Eosinophils Absolute 0.1 0.0 - 0.5 K/uL   Basophils Relative 0 %   Basophils Absolute 0.0 0.0 - 0.1 K/uL   Immature Granulocytes 1 %   Abs Immature Granulocytes 0.04 0.00 - 0.07 K/uL    Comment: Performed at [facility], [place] [address], [place], [place] [address]  Hepatic function panel     Status: Abnormal   Collection Time: 09/28/19  6:21 AM  Result Value Ref Range   Total Protein 6.7 6.5 - 8.1 g/dL   Albumin 2.5 (L) 3.5 - 5.0 g/dL   AST 54 (H) 15 - 41 U/L   ALT 52 (H) 0 - 44 U/L   Alkaline Phosphatase 161 (H) 38 - 126 U/L   Total Bilirubin 2.5 (H) 0.3 - 1.2 mg/dL   Bilirubin, Direct 1.3 (H) 0.0 - 0.2 mg/dL   Indirect Bilirubin 1.2 (H) 0.3 - 0.9 mg/dL    Comment: Performed at [facility], [place] [address], [place], [place] [address]  Lipase, blood     Status: None   Collection Time: 09/28/19  6:21 AM  Result Value Ref Range   Lipase 28 11 - 51 U/L    Comment: Performed at [facility], [place] [address] [place],  [address]  CBG monitoring, ED     Status:  Abnormal   Collection Time: 09/28/19 10:03 AM  Result Value Ref Range   Glucose-Capillary 141 (H) 70 - 99 mg/dL   DG Ankle Complete Right  Result Date: 09/28/2019 CLINICAL DATA:  Right ankle pain. No known trauma. EXAM: RIGHT ANKLE - COMPLETE 3+ VIEW COMPARISON:  Right ankle radiographs 02/11/2017. FINDINGS: Mildly displaced fracture is present at the tip of the fibula. Associated soft tissue swelling is present. The joint is located. No additional fractures are present. Degenerative changes are present within the ankle joint. IMPRESSION: Mildly displaced fracture at the tip of the fibula with associated soft tissue swelling. Electronically Signed   By:  San Morelle M.D.   On: 09/28/2019 07:05   CT RENAL STONE STUDY  Result Date: 09/28/2019 CLINICAL DATA:  Flank pain, kidney stone suspected right-sided abdominal pain. History of kidney stones. EXAM: CT ABDOMEN AND PELVIS WITHOUT CONTRAST TECHNIQUE: Multidetector CT imaging of the abdomen and pelvis was performed following the standard protocol without IV contrast. COMPARISON:  Renal ultrasound 07/29/2019, CT 06/15/2017 FINDINGS: Lower chest: Trace pleural effusions. Subpleural reticular opacities in both lower lobes. 16 mm right middle lobe nodule, with adjacent reticulonodular opacities. Hepatobiliary: Coarse calcification in the left lobe of the liver. Enlarged liver with steatosis, liver spans 22 cm cranial caudal. Distended gallbladder with calcified gallstones. No pericholecystic inflammation. No biliary dilatation. Pancreas: Fatty atrophy.  No ductal dilatation or inflammation. Spleen: Enlarged measuring 14.9 x 6.7 x 15.1 cm (volume = 790 cm^3). Adrenals/Urinary Tract: No adrenal nodule. No hydronephrosis. No renal or ureteral calculi. Mild symmetric bilateral perinephric edema. Urinary bladder is decompressed by Foley catheter. No bladder stones. Stomach/Bowel: Stomach is decompressed. Tiny duodenal diverticulum. No bowel obstruction or inflammation. There is laxity of the anterior abdominal wall musculature with abdomen leaning to the left. Broad-based lower ventral abdominal wall hernia versus pannus. Colonic diverticulosis without diverticulitis. Appendix not well visualized, no evidence of appendicitis. There is stool distending the rectum. Vascular/Lymphatic: Advanced aortic atherosclerosis. No aortic aneurysm. No abdominopelvic adenopathy. Reproductive: Calcified uterine fibroids. No adnexal mass. Other: No free air, free fluid, or intra-abdominal fluid collection. Musculoskeletal: Scoliosis and degenerative change in the spine. Atrophy bone island in the right pelvis. Thinning and laxity  anterior abdominal wall musculature. IMPRESSION: 1. No renal stones or obstructive uropathy. 2. Hepatosplenomegaly and hepatic steatosis. 3. Distended gallbladder with gallstones. No pericholecystic inflammation. 4. Colonic diverticulosis without diverticulitis. 5. Reticulonodular opacities in the right middle lobe, including a 16 mm nodule. This is not entirely included in the field of view. Recommend chest CT for complete evaluation. Trace pleural effusions and subpleural reticulation at the lung base. Aortic Atherosclerosis (ICD10-I70.0). Electronically Signed   By: Keith Rake M.D.   On: 09/28/2019 07:00   US Abdomen Limited RUQ  Result Date: 09/28/2019 CLINICAL DATA:  Upper abdominal pain EXAM: ULTRASOUND ABDOMEN LIMITED RIGHT UPPER QUADRANT COMPARISON:  CT abdomen and pelvis September 28, 2019 FINDINGS: Gallbladder: Within the gallbladder, there are echogenic foci which move and shadow consistent with cholelithiasis. Largest gallstone measures 1.1 cm in length. Gallbladder appears mildly distended without appreciable wall thickening or pericholecystic fluid. No sonographic Murphy sign noted by sonographer; patient is receiving pain medication which could mask sonographic Murphy sign. Common bile duct: Diameter: 3 mm. No intrahepatic or extrahepatic biliary duct dilatation. Liver: No focal lesion identified beyond a focal calcification in the left lobe, a likely granuloma, also seen on recent CT. Liver appears enlarged, also noted on recent CT. Within normal limits in parenchymal echogenicity. Portal vein is patent  on color Doppler imaging with normal direction of blood flow towards the liver. Other: Right kidney is echogenic. IMPRESSION: 1. Cholelithiasis. Gallbladder mildly distended. No gallbladder wall thickening or pericholecystic fluid. Unable to accurately assess for potential Percell Miller sign due to pain medication presence. It may be prudent to consider nuclear medicine hepatobiliary imaging study to  assess for cystic duct patency given these findings. 2. Small granuloma in the left lobe of the liver. No other focal liver lesions evident. Liver appears overall enlarged. 3. Echogenic right kidney, a finding likely indicative of a degree of medical renal disease. Electronically Signed   By: Lowella Grip III M.D.   On: 09/28/2019 08:12      Assessment/Plan HTN  CKD stage IV Chronic ITP Hypothyroidism T2DM morbid obesity UTI - abx  R fibula fx - per ortho   Symptomatic cholelithiasis - CT and [place] with cholelithiasis but no signs of acute cholecystitis - WBC 5.2 and patient is afebrile - pain has clinically resolved and patient does not desire surgery at this time - LFTs and Tbili mildly elevated, recommend follow up labs in 1 week or sooner if symptoms worsen - recommend low fat diet  - follow up outpatient as needed  Brigid Re, [facility] Surgery 09/28/2019, 10:34 AM Please see Amion for pager number during day hours 7:00am-4:30pm

## 2019-09-28 NOTE — ED Notes (Signed)
Patients facility did not give EMS DNR form.

## 2019-09-28 NOTE — ED Notes (Signed)
[facility] called for transport.  

## 2019-09-28 NOTE — ED Notes (Signed)
Patient transported to CT 

## 2019-09-28 NOTE — ED Notes (Signed)
D/c paperwork reviewed with patient and Mendel Ryder, caretaker at [facility].

## 2019-09-28 NOTE — ED Triage Notes (Addendum)
Per EMS: Pt is coming from [facility] with c/o RUQ abdominal pain. Patient has a hx of kidney stones and states this feels like the same pain, in the same location. Pt feels nauseous with no vomiting or diarrhea. Covid test negative two days ago. Pt reports having to sit up for her CHF.  EMS VITALS: BP 140/60 HR 90 RR 20 SPO2 98 2L CBG 174 TEMP 98.8

## 2019-09-28 NOTE — Discharge Instructions (Signed)
You were seen in the emergency department for right-sided abdominal pain.  You were found to have a urinary tract infection.  Your CAT scan also showed gallstones but the surgeons did not think that you needed a surgery.  You also had x-rays of your right ankle that showed a fibular fracture.  Orthopedics recommended treating this with a walking boot and weightbearing as tolerated.  Surgery recommends that you have repeat blood work done next week to make sure your liver function test continue to improve.  They recommend you follow-up in their clinic, phone number provided.  Return to the emergency department if any fever or worsening symptoms.    Cholelithiasis  Cholelithiasis is also called "gallstones." It is a kind of gallbladder disease. The gallbladder is an organ that stores a liquid (bile) that helps you digest fat. Gallstones may not cause symptoms (may be silent gallstones) until they cause a blockage, and then they can cause pain (gallbladder attack). Follow these instructions at home:  Take over-the-counter and prescription medicines only as told by your doctor.  Stay at a healthy weight.  Eat healthy foods. This includes: ? Eating fewer fatty foods, like fried foods. ? Eating fewer refined carbs (refined carbohydrates). Refined carbs are breads and grains that are highly processed, like white bread and white rice. Instead, choose whole grains like whole-wheat bread and brown rice. ? Eating more fiber. Almonds, fresh fruit, and beans are healthy sources of fiber.  Keep all follow-up visits as told by your doctor. This is important. Contact a doctor if:  You have sudden pain in the upper right side of your belly (abdomen). Pain might spread to your right shoulder or your chest. This may be a sign of a gallbladder attack.  You feel sick to your stomach (are nauseous).  You throw up (vomit).  You have been diagnosed with gallstones that have no symptoms and you get: ? Belly  pain. ? Discomfort, burning, or fullness in the upper part of your belly (indigestion). Get help right away if:  You have sudden pain in the upper right side of your belly, and it lasts for more than 2 hours.  You have belly pain that lasts for more than 5 hours.  You have a fever or chills.  You keep feeling sick to your stomach or you keep throwing up.  Your skin or the whites of your eyes turn yellow (jaundice).  You have dark-colored pee (urine).  You have light-colored poop (stool). Summary  Cholelithiasis is also called "gallstones."  The gallbladder is an organ that stores a liquid (bile) that helps you digest fat.  Silent gallstones are gallstones that do not cause symptoms.  A gallbladder attack may cause sudden pain in the upper right side of your belly. Pain might spread to your right shoulder or your chest. If this happens, contact your doctor.  If you have sudden pain in the upper right side of your belly that lasts for more than 2 hours, get help right away. This information is not intended to replace advice given to you by your health care provider. Make sure you discuss any questions you have with your health care provider. Document Revised: 08/06/2017 Document Reviewed: 05/10/2016 Elsevier Patient Education  [place] If you have a gallbladder condition, you may have trouble digesting fats. Eating a low-fat diet can help reduce your symptoms, and may be helpful before and after having surgery to remove your gallbladder (cholecystectomy). Your health  care provider may recommend that you work with a diet and nutrition specialist (dietitian) to help you reduce the amount of fat in your diet. What are tips for following this plan? General guidelines  Limit your fat intake to less than 30% of your total daily calories. If you eat around 1,800 calories each day, this is less than 60 grams (g) of fat per day.  Fat is an important  part of a healthy diet. Eating a low-fat diet can make it hard to maintain a healthy body weight. Ask your dietitian how much fat, calories, and other nutrients you need each day.  Eat small, frequent meals throughout the day instead of three large meals.  Drink at least 8-10 cups of fluid a day. Drink enough fluid to keep your urine clear or pale yellow.  Limit alcohol intake to no more than 1 drink a day for nonpregnant women and 2 drinks a day for men. One drink equals 12 oz of beer, 5 oz of wine, or 1 oz of hard liquor. Reading food labels  Check Nutrition Facts on food labels for the amount of fat per serving. Choose foods with less than 3 grams of fat per serving. Shopping  Choose nonfat and low-fat healthy foods. Look for the words "nonfat," "low fat," or "fat free."  Avoid buying processed or prepackaged foods. Cooking  Cook using low-fat methods, such as baking, broiling, grilling, or boiling.  Cook with small amounts of healthy fats, such as olive oil, grapeseed oil, canola oil, or sunflower oil. What foods are recommended?   All fresh, frozen, or canned fruits and vegetables.  Whole grains.  Low-fat or non-fat (skim) milk and yogurt.  Lean meat, skinless poultry, fish, eggs, and beans.  Low-fat protein supplement powders or drinks.  Spices and herbs. What foods are not recommended?  High-fat foods. These include baked goods, fast food, fatty cuts of meat, ice cream, french toast, sweet rolls, pizza, cheese bread, foods covered with butter, creamy sauces, or cheese.  Fried foods. These include french fries, tempura, battered fish, breaded chicken, fried breads, and sweets.  Foods with strong odors.  Foods that cause bloating and gas. Summary  A low-fat diet can be helpful if you have a gallbladder condition, or before and after gallbladder surgery.  Limit your fat intake to less than 30% of your total daily calories. This is about 60 g of fat if you eat 1,800  calories each day.  Eat small, frequent meals throughout the day instead of three large meals. This information is not intended to replace advice given to you by your health care provider. Make sure you discuss any questions you have with your health care provider. Document Revised: 12/15/2018 Document Reviewed: 10/01/2016 Elsevier Patient Education  [place].

## 2019-09-28 NOTE — ED Provider Notes (Addendum)
[facility] DEPT Provider Note: Georgena Spurling, MD, FACEP  CSN: [id number] MRN: [id number] ARRIVAL: 09/28/19 at [facility]: [place]  Abdominal Pain   HISTORY OF PRESENT ILLNESS  09/28/19 6:24 AM [name] is a 85 y.o. female who had the onset of right lower quadrant abdominal pain this morning which is moderate to severe.  She characterizes it as like previous nephrolithiasis.  It is not significantly changed with movement or palpation.  She is nonambulatory and has an indwelling Foley catheter.  She also sleeps sitting up due to CHF.  Her urine has not been bloody.  She has had associated nausea but no vomiting or diarrhea.  She tested negative for Covid 2 days ago.  She is also complaining of new pain in her right ankle for about a day.  She there was no associated trauma.   Past Medical History:  Diagnosis Date  . Allergic rhinitis   . Anxiety state, unspecified    panic attacks  . CHF (congestive heart failure) ([place])   . Depressive disorder, not elsewhere classified   . Extrinsic asthma, unspecified    no problem since adulthood  . Obesity   . OSA on CPAP    severe  . Pure hypercholesterolemia   . Respiratory failure with hypoxia ([place]) 09/2008   acute, secondary to multiple bilateral pulmonary embolism , negative hypercoagulable workup 09/2008 hospital stay  . Scoliosis   . Type II or unspecified type diabetes mellitus without mention of complication, not stated as uncontrolled   . Unspecified essential hypertension   . Unspecified hypothyroidism    hypo    Past Surgical History:  Procedure Laterality Date  . CARDIOVERSION N/A 06/26/2019   Procedure: CARDIOVERSION;  Surgeon: Jerline Pain, MD;  Location: [facility] ENDOSCOPY;  Service: Cardiovascular;  Laterality: N/A;  . EYE SURGERY    . JOINT REPLACEMENT    . knee replaced    . THYROID SURGERY      Family History  Problem Relation Age of Onset  . Allergies Mother   . Clotting disorder  Mother   . Osteoarthritis Mother   . Asthma Mother   . Arthritis Other   . Diabetes Other   . Hyperlipidemia Other   . Hypertension Other   . Coronary artery disease Other   . Stroke Other   . Osteoarthritis Daughter   . Rheum arthritis Maternal Grandmother   . Clotting disorder Maternal Grandmother   . Clotting disorder Maternal Uncle   . Clotting disorder Daughter   . Allergies Daughter   . Breast cancer Neg Hx     Social History   Tobacco Use  . Smoking status: Former Smoker    Packs/day: 0.25    Years: 20.00    Pack years: 5.00    Types: Cigarettes    Quit date: 09/07/1989    Years since quitting: 30.0  . Smokeless tobacco: Never Used  Substance Use Topics  . Alcohol use: No  . Drug use: No    Prior to Admission medications   Medication Sig Start Date End Date Taking? Authorizing Provider  atorvastatin (LIPITOR) 40 MG tablet Take 40 mg by mouth at bedtime.    Yes [provider]  bisacodyl (DULCOLAX) 5 MG EC tablet Take 10 mg by mouth every 3 (three) days as needed (for constipation).    Yes [provider]  fenofibrate 54 MG tablet TAKE 1 TABLET(54 MG) BY MOUTH DAILY Patient taking differently: Take 54 mg by  mouth daily.  08/04/17  Yes Nafziger, Tommi Rumps, NP  acetaminophen (TYLENOL) 325 MG tablet Take 650 mg by mouth every 6 (six) hours as needed for moderate pain or fever (for fever or pain/discomfort).     [provider]  Amino Acids-Protein Hydrolys (FEEDING SUPPLEMENT, PRO-STAT SUGAR FREE 64,) LIQD Take 30 mLs by mouth 2 (two) times daily. 08/07/19   Debbe Odea, MD  amLODipine (NORVASC) 2.5 MG tablet Take 1 tablet (2.5 mg total) by mouth daily. 08/08/19   Debbe Odea, MD  Cholecalciferol (VITAMIN D3) 2000 units TABS Take 2,000 Units by mouth daily.    [provider]  docusate sodium (COLACE) 100 MG capsule Take 100 mg by mouth daily.  04/30/15   [provider]  furosemide (LASIX) 20 MG tablet Take 1 tablet (20 mg  total) by mouth daily. Take 20 mg by mouth in the morning on Sun/Tues/Thurs/Sat 40mg  on Mon/Wed/Friday 08/07/19   Debbe Odea, MD  HYDROcodone-acetaminophen (NORCO/VICODIN) 5-325 MG tablet Take 1 tablet by mouth every 6 (six) hours as needed for moderate pain. 08/07/19   Debbe Odea, MD  insulin aspart (NOVOLOG) 100 UNIT/ML injection Inject 0-20 Units into the skin 3 (three) times daily with meals. CBG 121 - 150: 3 units  CBG 151 - 200: 4 units  CBG 201 - 250: 7 units  CBG 251 - 300: 11 units  CBG 301 - 350: 15 units  CBG 351 - 400: 20 units  CBG > 400 call MD 08/07/19   Debbe Odea, MD  insulin glargine (LANTUS) 100 UNIT/ML injection Inject 0.25 mLs (25 Units total) into the skin at bedtime. 08/07/19   Debbe Odea, MD  levothyroxine (SYNTHROID, LEVOTHROID) 112 MCG tablet TAKE 1 TABLET BY MOUTH DAILY Patient taking differently: Take 112 mcg by mouth daily before breakfast.  11/24/17   Dorothyann Peng, NP  Multiple Vitamin (MULTIVITAMIN) tablet Take 1 tablet by mouth daily.      [provider]  omeprazole (PRILOSEC) 20 MG capsule Take 20 mg by mouth daily.  01/25/19   [provider]  potassium citrate (UROCIT-K) 10 MEQ (1080 MG) SR tablet Take 2 tablets (20 mEq total) by mouth daily. 08/07/19   Debbe Odea, MD  predniSONE (DELTASONE) 20 MG tablet Take 40 mg daily. Taper by 10 mg every Friday. 08/07/19   Debbe Odea, MD  sertraline (ZOLOFT) 100 MG tablet Take 100 mg by mouth every evening.  05/24/18   [provider]  vitamin C (ASCORBIC ACID) 250 MG tablet Take 250 mg by mouth daily.    [provider]    Allergies Adhesive [tape], Apixaban, Aspirin, Mirabegron, Pineapple, Metformin, Tetracycline, Fluticasone-salmeterol, Iodinated diagnostic agents, Lactose intolerance (gi), Latex, Lisinopril, Metronidazole, Sulfa antibiotics, and Sulfonamide derivatives   REVIEW OF SYSTEMS  Negative except as noted here or in the History of Present  Illness.   PHYSICAL EXAMINATION  Initial Vital Signs Blood pressure (!) 147/64, pulse 74, temperature 98.2 F (36.8 C), temperature source Oral, resp. rate 16, SpO2 97 %.  Examination General: Well-developed, well-nourished female in no acute distress; appearance consistent with age of record HENT: normocephalic; atraumatic Eyes: pupils equal, round and reactive to light; extraocular muscles intact; bilateral pseudophakia Neck: supple Heart: regular rate and rhythm Lungs: clear to auscultation bilaterally Abdomen: soft; nondistended; mild right lower quadrant tenderness; bowel sounds present Extremities: No acute deformity; pulses normal; trace edema of lower legs; tenderness of right anterior ankle Neurologic: Awake, alert and oriented; motor function intact in all extremities and symmetric; no  facial droop Skin: Warm and dry Psychiatric: Normal mood and affect   RESULTS  Summary of this visit's results, reviewed and interpreted by myself:   EKG Interpretation  Date/Time:    Ventricular Rate:    PR Interval:    QRS Duration:   QT Interval:    QTC Calculation:   R Axis:     Text Interpretation:        Laboratory Studies: Results for orders placed or performed during the hospital encounter of 09/28/19 (from the past 24 hour(s))  Basic metabolic panel     Status: Abnormal   Collection Time: 09/28/19  6:21 AM  Result Value Ref Range   Sodium 140 135 - 145 mmol/L   Potassium 3.1 (L) 3.5 - 5.1 mmol/L   Chloride 95 (L) 98 - 111 mmol/L   CO2 32 22 - 32 mmol/L   Glucose, Bld 149 (H) 70 - 99 mg/dL   BUN 37 (H) 8 - 23 mg/dL   Creatinine, Ser 1.61 (H) 0.44 - 1.00 mg/dL   Calcium 8.5 (L) 8.9 - 10.3 mg/dL   GFR calc non Af Amer 29 (L) >60 mL/min   GFR calc Af Amer 34 (L) >60 mL/min   Anion gap 13 5 - 15  Urinalysis, Routine w reflex microscopic     Status: Abnormal   Collection Time: 09/28/19  6:21 AM  Result Value Ref Range   Color, Urine YELLOW YELLOW   APPearance HAZY  (A) CLEAR   Specific Gravity, Urine 1.009 1.005 - 1.030   pH 6.0 5.0 - 8.0   Glucose, UA NEGATIVE NEGATIVE mg/dL   Hgb urine dipstick NEGATIVE NEGATIVE   Bilirubin Urine NEGATIVE NEGATIVE   Ketones, ur NEGATIVE NEGATIVE mg/dL   Protein, ur NEGATIVE NEGATIVE mg/dL   Nitrite POSITIVE (A) NEGATIVE   Leukocytes,Ua LARGE (A) NEGATIVE   RBC / HPF 0-5 0 - 5 RBC/hpf   WBC, UA >50 (H) 0 - 5 WBC/hpf   Bacteria, UA RARE (A) NONE SEEN   WBC Clumps PRESENT    Budding Yeast PRESENT    Ca Oxalate Crys, UA PRESENT   CBC with Differential/Platelet     Status: Abnormal   Collection Time: 09/28/19  6:21 AM  Result Value Ref Range   WBC 5.2 4.0 - 10.5 K/uL   RBC 3.27 (L) 3.87 - 5.11 MIL/uL   Hemoglobin 9.5 (L) 12.0 - 15.0 g/dL   HCT 30.6 (L) 36.0 - 46.0 %   MCV 93.6 80.0 - 100.0 fL   MCH 29.1 26.0 - 34.0 pg   MCHC 31.0 30.0 - 36.0 g/dL   RDW 17.9 (H) 11.5 - 15.5 %   Platelets 113 (L) 150 - 400 K/uL   nRBC 0.0 0.0 - 0.2 %   Neutrophils Relative % 77 %   Neutro Abs 4.1 1.7 - 7.7 K/uL   Lymphocytes Relative 9 %   Lymphs Abs 0.5 (L) 0.7 - 4.0 K/uL   Monocytes Relative 10 %   Monocytes Absolute 0.5 0.1 - 1.0 K/uL   Eosinophils Relative 3 %   Eosinophils Absolute 0.1 0.0 - 0.5 K/uL   Basophils Relative 0 %   Basophils Absolute 0.0 0.0 - 0.1 K/uL   Immature Granulocytes 1 %   Abs Immature Granulocytes 0.04 0.00 - 0.07 K/uL  Hepatic function panel     Status: Abnormal   Collection Time: 09/28/19  6:21 AM  Result Value Ref Range   Total Protein 6.7 6.5 - 8.1 g/dL   Albumin 2.5 (  L) 3.5 - 5.0 g/dL   AST 54 (H) 15 - 41 U/L   ALT 52 (H) 0 - 44 U/L   Alkaline Phosphatase 161 (H) 38 - 126 U/L   Total Bilirubin 2.5 (H) 0.3 - 1.2 mg/dL   Bilirubin, Direct 1.3 (H) 0.0 - 0.2 mg/dL   Indirect Bilirubin 1.2 (H) 0.3 - 0.9 mg/dL  Lipase, blood     Status: None   Collection Time: 09/28/19  6:21 AM  Result Value Ref Range   Lipase 28 11 - 51 U/L  CBG monitoring, ED     Status: Abnormal   Collection Time:  09/28/19 10:03 AM  Result Value Ref Range   Glucose-Capillary 141 (H) 70 - 99 mg/dL   Imaging Studies: DG Ankle Complete Right  Result Date: 09/28/2019 CLINICAL DATA:  Right ankle pain. No known trauma. EXAM: RIGHT ANKLE - COMPLETE 3+ VIEW COMPARISON:  Right ankle radiographs 02/11/2017. FINDINGS: Mildly displaced fracture is present at the tip of the fibula. Associated soft tissue swelling is present. The joint is located. No additional fractures are present. Degenerative changes are present within the ankle joint. IMPRESSION: Mildly displaced fracture at the tip of the fibula with associated soft tissue swelling. Electronically Signed   By: San Morelle M.D.   On: 09/28/2019 07:05   CT RENAL STONE STUDY  Result Date: 09/28/2019 CLINICAL DATA:  Flank pain, kidney stone suspected right-sided abdominal pain. History of kidney stones. EXAM: CT ABDOMEN AND PELVIS WITHOUT CONTRAST TECHNIQUE: Multidetector CT imaging of the abdomen and pelvis was performed following the standard protocol without IV contrast. COMPARISON:  Renal ultrasound 07/29/2019, CT 06/15/2017 FINDINGS: Lower chest: Trace pleural effusions. Subpleural reticular opacities in both lower lobes. 16 mm right middle lobe nodule, with adjacent reticulonodular opacities. Hepatobiliary: Coarse calcification in the left lobe of the liver. Enlarged liver with steatosis, liver spans 22 cm cranial caudal. Distended gallbladder with calcified gallstones. No pericholecystic inflammation. No biliary dilatation. Pancreas: Fatty atrophy.  No ductal dilatation or inflammation. Spleen: Enlarged measuring 14.9 x 6.7 x 15.1 cm (volume = 790 cm^3). Adrenals/Urinary Tract: No adrenal nodule. No hydronephrosis. No renal or ureteral calculi. Mild symmetric bilateral perinephric edema. Urinary bladder is decompressed by Foley catheter. No bladder stones. Stomach/Bowel: Stomach is decompressed. Tiny duodenal diverticulum. No bowel obstruction or inflammation.  There is laxity of the anterior abdominal wall musculature with abdomen leaning to the left. Broad-based lower ventral abdominal wall hernia versus pannus. Colonic diverticulosis without diverticulitis. Appendix not well visualized, no evidence of appendicitis. There is stool distending the rectum. Vascular/Lymphatic: Advanced aortic atherosclerosis. No aortic aneurysm. No abdominopelvic adenopathy. Reproductive: Calcified uterine fibroids. No adnexal mass. Other: No free air, free fluid, or intra-abdominal fluid collection. Musculoskeletal: Scoliosis and degenerative change in the spine. Atrophy bone island in the right pelvis. Thinning and laxity anterior abdominal wall musculature. IMPRESSION: 1. No renal stones or obstructive uropathy. 2. Hepatosplenomegaly and hepatic steatosis. 3. Distended gallbladder with gallstones. No pericholecystic inflammation. 4. Colonic diverticulosis without diverticulitis. 5. Reticulonodular opacities in the right middle lobe, including a 16 mm nodule. This is not entirely included in the field of view. Recommend chest CT for complete evaluation. Trace pleural effusions and subpleural reticulation at the lung base. Aortic Atherosclerosis (ICD10-I70.0). Electronically Signed   By: Keith Rake M.D.   On: 09/28/2019 07:00   US Abdomen Limited RUQ  Result Date: 09/28/2019 CLINICAL DATA:  Upper abdominal pain EXAM: ULTRASOUND ABDOMEN LIMITED RIGHT UPPER QUADRANT COMPARISON:  CT abdomen and pelvis September 28, 2019  FINDINGS: Gallbladder: Within the gallbladder, there are echogenic foci which move and shadow consistent with cholelithiasis. Largest gallstone measures 1.1 cm in length. Gallbladder appears mildly distended without appreciable wall thickening or pericholecystic fluid. No sonographic Murphy sign noted by sonographer; patient is receiving pain medication which could mask sonographic Murphy sign. Common bile duct: Diameter: 3 mm. No intrahepatic or extrahepatic biliary  duct dilatation. Liver: No focal lesion identified beyond a focal calcification in the left lobe, a likely granuloma, also seen on recent CT. Liver appears enlarged, also noted on recent CT. Within normal limits in parenchymal echogenicity. Portal vein is patent on color Doppler imaging with normal direction of blood flow towards the liver. Other: Right kidney is echogenic. IMPRESSION: 1. Cholelithiasis. Gallbladder mildly distended. No gallbladder wall thickening or pericholecystic fluid. Unable to accurately assess for potential Percell Miller sign due to pain medication presence. It may be prudent to consider nuclear medicine hepatobiliary imaging study to assess for cystic duct patency given these findings. 2. Small granuloma in the left lobe of the liver. No other focal liver lesions evident. Liver appears overall enlarged. 3. Echogenic right kidney, a finding likely indicative of a degree of medical renal disease. Electronically Signed   By: Lowella Grip III M.D.   On: 09/28/2019 08:12    ED COURSE and MDM  Nursing notes, initial and subsequent vitals signs, including pulse oximetry, reviewed and interpreted by myself.  Vitals:   09/28/19 0630 09/28/19 0730 09/28/19 0953 09/28/19 1117  BP: 126/65 115/75 105/66 (!) 126/46  Pulse: 73 72 70 68  Resp: 17  15 20   Temp:      TempSrc:      SpO2: 99% 99% 94% 99%   Medications  ondansetron (ZOFRAN) injection 4 mg (4 mg Intravenous Given 09/28/19 0620)  fentaNYL (SUBLIMAZE) injection 100 mcg (100 mcg Intravenous Given 09/28/19 0620)   7:00 AM Patient signed out to Dr. Melina Copa.  CT renal stone study pending.   PROCEDURES  Procedures   ED DIAGNOSES     ICD-10-CM   1. Urinary tract infection associated with indwelling urethral catheter, initial encounter ([place])  T83.511A    N39.0   2. RUQ abdominal pain  R10.11 US Abdomen Limited RUQ    US Abdomen Limited RUQ  3. Calculus of gallbladder without cholecystitis without obstruction  K80.20   4.  Elevated LFTs  R79.89   5. Closed fracture of distal end of right fibula, unspecified fracture morphology, initial encounter  F02.637C        Shanon Rosser, MD 09/28/19 2240

## 2019-09-28 NOTE — ED Provider Notes (Signed)
Signout from Dr. Florina Ou.  85 year old female complaining of right-sided abdominal pain, states similar to her kidney stone pain in the past.  She is pending labs and a CT abdomen and pelvis.  Disposition per results of testing.  Patient also had pain in her right ankle and is getting x-rays of that. Physical Exam  BP 126/65   Pulse 73   Temp 98.2 F (36.8 C) (Oral)   Resp 17   LMP  (LMP Unknown)   SpO2 99%   Physical Exam  ED Course/Procedures   Clinical Course as of Sep 27 1033  Thu Sep 28, 2019  1029 Patient seen by surgery.  They do not feel the patient has an acute abdomen and does not need to be admitted from their standpoint.  Recommend have facility recheck labs to make sure the not continuing to rise.  Can follow-up with them outpatient in the office.   [MB]    Clinical Course User Index [MB] Hayden Rasmussen, MD    Procedures  MDM  Ankle x-ray showing minimally displaced fibular fracture.  I reevaluated the patient for this.  She does not recall any trauma but she said she is working with physical therapy to start regaining strength in her legs and thinks she might of twisted it.  LFTs are elevated and CT showing some gallbladder distention with some stones.  Ordered right upper quadrant abdominal pain.  Patient states her pain has resolved but points to her right upper quadrant where she had her pain prior to the pain medicine.  Urinalysis showing UTI with greater than 50 whites nitrite positive.  Ordered culture and ceftriaxone.  Discussed with Dr. Ninfa Linden orthopedics.  He recommended a boot and weightbearing as tolerated.  Discussed with general surgery who felt that the patient probably did not have acute cholecystitis but will evaluate them and leave recommendations.       Hayden Rasmussen, MD 09/28/19 [phone]

## 2019-10-02 ENCOUNTER — Encounter: Payer: Self-pay | Admitting: Family

## 2019-10-02 ENCOUNTER — Inpatient Hospital Stay (HOSPITAL_BASED_OUTPATIENT_CLINIC_OR_DEPARTMENT_OTHER): Payer: Medicare Other | Admitting: Family

## 2019-10-02 ENCOUNTER — Inpatient Hospital Stay: Payer: Medicare Other

## 2019-10-02 ENCOUNTER — Inpatient Hospital Stay: Payer: Medicare Other | Attending: Hematology & Oncology

## 2019-10-02 ENCOUNTER — Other Ambulatory Visit: Payer: Self-pay

## 2019-10-02 VITALS — BP 130/37 | HR 68 | Temp 97.1°F | Resp 18 | Ht 60.0 in

## 2019-10-02 DIAGNOSIS — D649 Anemia, unspecified: Secondary | ICD-10-CM | POA: Diagnosis not present

## 2019-10-02 DIAGNOSIS — N184 Chronic kidney disease, stage 4 (severe): Secondary | ICD-10-CM | POA: Diagnosis not present

## 2019-10-02 DIAGNOSIS — D693 Immune thrombocytopenic purpura: Secondary | ICD-10-CM | POA: Diagnosis present

## 2019-10-02 DIAGNOSIS — D500 Iron deficiency anemia secondary to blood loss (chronic): Secondary | ICD-10-CM | POA: Diagnosis not present

## 2019-10-02 DIAGNOSIS — D696 Thrombocytopenia, unspecified: Secondary | ICD-10-CM

## 2019-10-02 LAB — CBC WITH DIFFERENTIAL (CANCER CENTER ONLY)
Abs Immature Granulocytes: 0.02 10*3/uL (ref 0.00–0.07)
Basophils Absolute: 0 10*3/uL (ref 0.0–0.1)
Basophils Relative: 0 %
Eosinophils Absolute: 0.1 10*3/uL (ref 0.0–0.5)
Eosinophils Relative: 4 %
HCT: 27.7 % — ABNORMAL LOW (ref 36.0–46.0)
Hemoglobin: 8.3 g/dL — ABNORMAL LOW (ref 12.0–15.0)
Immature Granulocytes: 1 %
Lymphocytes Relative: 15 %
Lymphs Abs: 0.6 10*3/uL — ABNORMAL LOW (ref 0.7–4.0)
MCH: 27.9 pg (ref 26.0–34.0)
MCHC: 30 g/dL (ref 30.0–36.0)
MCV: 93.3 fL (ref 80.0–100.0)
Monocytes Absolute: 0.3 10*3/uL (ref 0.1–1.0)
Monocytes Relative: 8 %
Neutro Abs: 2.7 10*3/uL (ref 1.7–7.7)
Neutrophils Relative %: 72 %
Platelet Count: 154 10*3/uL (ref 150–400)
RBC: 2.97 MIL/uL — ABNORMAL LOW (ref 3.87–5.11)
RDW: 17 % — ABNORMAL HIGH (ref 11.5–15.5)
WBC Count: 3.7 10*3/uL — ABNORMAL LOW (ref 4.0–10.5)
nRBC: 0 % (ref 0.0–0.2)

## 2019-10-02 LAB — SAMPLE TO BLOOD BANK

## 2019-10-02 LAB — URINE CULTURE: Culture: >=100000 — AB

## 2019-10-02 LAB — CMP (CANCER CENTER ONLY)
ALT: 18 U/L (ref 0–44)
AST: 22 U/L (ref 15–41)
Albumin: 3 g/dL — ABNORMAL LOW (ref 3.5–5.0)
Alkaline Phosphatase: 152 U/L — ABNORMAL HIGH (ref 38–126)
Anion gap: 7 (ref 5–15)
BUN: 30 mg/dL — ABNORMAL HIGH (ref 8–23)
CO2: 34 mmol/L — ABNORMAL HIGH (ref 22–32)
Calcium: 9 mg/dL (ref 8.9–10.3)
Chloride: 101 mmol/L (ref 98–111)
Creatinine: 1.28 mg/dL — ABNORMAL HIGH (ref 0.44–1.00)
GFR, Est AFR Am: 45 mL/min — ABNORMAL LOW (ref >60)
GFR, Estimated: 39 mL/min — ABNORMAL LOW (ref >60)
Glucose, Bld: 154 mg/dL — ABNORMAL HIGH (ref 70–99)
Potassium: 3.6 mmol/L (ref 3.5–5.1)
Sodium: 142 mmol/L (ref 135–145)
Total Bilirubin: 0.8 mg/dL (ref 0.3–1.2)
Total Protein: 6.2 g/dL — ABNORMAL LOW (ref 6.5–8.1)

## 2019-10-02 MED ORDER — ROMIPLOSTIM 250 MCG ~~LOC~~ SOLR
50.0000 ug | Freq: Once | SUBCUTANEOUS | Status: AC
  Administered 2019-10-02: 50 ug via SUBCUTANEOUS
  Filled 2019-10-02: qty 0.1

## 2019-10-02 NOTE — Progress Notes (Signed)
Pltc = 154 Last NPlate was in November. Give NPlate 0.5 mcg/kg today per Dr. Marin Olp. Dose entered per his instructions.

## 2019-10-02 NOTE — Progress Notes (Signed)
Hematology and Oncology Follow Up Visit  [name] [id number] June 18, 1935 85 y.o. 10/02/2019   Principle Diagnosis:  Immune based thrombocytopenia History of PE Iron deficiency anemia Atrial fib  Current Therapy:   Nplate q week for platelet count < 100K IV Iron as indicated Eliquis 2.5 mg po BID   Interim History:  Ms. Lasota is here today with her caregiver for follow-up and injection. She has really had a rough time since we last saw her in September 2020. She has been in an out of the hospital with respiratory infections/failure/pneumonitis. She also currently has a UTI which was treated over the weekend with IV antibiotics and has a catheter in place.  She is currently in a wheelchair and states that she is unable to stand. The wheel chair that she has at this time unfortunately does not fit through any of our doorways. She states that another chair has been ordered and she is waiting for it to arrive.  She is on 2L [place] supplemental O2 24 hours a day. She has some SOB at times with over exertion and will take a break to rest as needed.  No fever, chills, n/v, cough, rash, dizziness, chest pain, palpitations (resolved with ablation), abdominal pain or changes in bowel habits.  She states that the swelling in her lower extremities is stable. She states that her legs are numb since spending around 2 months bed bound. No tenderness, tingling or pain in her extremities at this time.  No falls or syncope to report at this time.  She states that she does not have much of an appetite but does hydrate. Patient unable to stand for weight.   ECOG Performance Status: 3 - Symptomatic, >50% confined to bed  Medications:  Allergies as of 10/02/2019      Reactions   Adhesive [tape] Other (See Comments)   PULLS OFF THE SKIN   Apixaban Other (See Comments)   Internal Bleeding   Aspirin Itching, Rash, Hives, Swelling   Swelling of her tongue   Mirabegron Other (See Comments)   Patient  experienced A-Fib   Pineapple Anaphylaxis, Swelling   Throat swells and blisters on tongue and roof of mouth per patient   Metformin Diarrhea, Nausea Only   Tetracycline Hives   Fluticasone-salmeterol Itching, Rash   Iodinated Diagnostic Agents Itching, Rash      Lactose Intolerance (gi) Other (See Comments)   Gas   Latex Itching, Rash, Other (See Comments)   Pulls off the skin and causes welts   Lisinopril Cough   Metronidazole Other (See Comments)   Reaction not known   Sulfa Antibiotics Rash   Sulfonamide Derivatives Itching, Rash      Medication List       Accurate as of October 02, 2019  9:22 AM. If you have any questions, ask your nurse or doctor.        acetaminophen 325 MG tablet Commonly known as: TYLENOL Take 650 mg by mouth every 6 (six) hours as needed for moderate pain or fever (for fever or pain/discomfort).   amLODipine 2.5 MG tablet Commonly known as: NORVASC Take 1 tablet (2.5 mg total) by mouth daily.   atorvastatin 40 MG tablet Commonly known as: LIPITOR Take 40 mg by mouth at bedtime.   bisacodyl 5 MG EC tablet Commonly known as: DULCOLAX Take 10 mg by mouth every 3 (three) days as needed (for constipation).   docusate sodium 100 MG capsule Commonly known as: COLACE Take 100 mg by mouth  daily.   feeding supplement (PRO-STAT SUGAR FREE 64) Liqd Take 30 mLs by mouth 2 (two) times daily.   fenofibrate 54 MG tablet TAKE 1 TABLET(54 MG) BY MOUTH DAILY What changed: See the new instructions.   furosemide 20 MG tablet Commonly known as: LASIX Take 1 tablet (20 mg total) by mouth daily. Take 20 mg by mouth in the morning on Sun/Tues/Thurs/Sat 40mg  on Mon/Wed/Friday   HYDROcodone-acetaminophen 5-325 MG tablet Commonly known as: NORCO/VICODIN Take 1 tablet by mouth every 6 (six) hours as needed for moderate pain.   insulin aspart 100 UNIT/ML injection Commonly known as: novoLOG Inject 0-20 Units into the skin 3 (three) times daily with meals.  CBG 121 - 150: 3 units  CBG 151 - 200: 4 units  CBG 201 - 250: 7 units  CBG 251 - 300: 11 units  CBG 301 - 350: 15 units  CBG 351 - 400: 20 units  CBG > 400 call MD   insulin glargine 100 UNIT/ML injection Commonly known as: LANTUS Inject 0.25 mLs (25 Units total) into the skin at bedtime.   levothyroxine 112 MCG tablet Commonly known as: SYNTHROID TAKE 1 TABLET BY MOUTH DAILY What changed: when to take this   multivitamin tablet Take 1 tablet by mouth daily.   omeprazole 20 MG capsule Commonly known as: PRILOSEC Take 20 mg by mouth daily.   potassium citrate 10 MEQ (1080 MG) SR tablet Commonly known as: UROCIT-K Take 2 tablets (20 mEq total) by mouth daily.   predniSONE 20 MG tablet Commonly known as: DELTASONE Take 40 mg daily. Taper by 10 mg every Friday.   sertraline 100 MG tablet Commonly known as: ZOLOFT Take 100 mg by mouth every evening.   vitamin C 250 MG tablet Commonly known as: ASCORBIC ACID Take 250 mg by mouth daily.   Vitamin D3 50 MCG (2000 UT) Tabs Take 2,000 Units by mouth daily.       Allergies:  Allergies  Allergen Reactions  . Adhesive [Tape] Other (See Comments)    PULLS OFF THE SKIN  . Apixaban Other (See Comments)    Internal Bleeding  . Aspirin Itching, Rash, Hives and Swelling    Swelling of her tongue  . Mirabegron Other (See Comments)    Patient experienced A-Fib  . Pineapple Anaphylaxis and Swelling    Throat swells and blisters on tongue and roof of mouth per patient  . Metformin Diarrhea and Nausea Only  . Tetracycline Hives  . Fluticasone-Salmeterol Itching and Rash  . Iodinated Diagnostic Agents Itching and Rash       . Lactose Intolerance (Gi) Other (See Comments)    Gas   . Latex Itching, Rash and Other (See Comments)    Pulls off the skin and causes welts  . Lisinopril Cough  . Metronidazole Other (See Comments)    Reaction not known  . Sulfa Antibiotics Rash  . Sulfonamide Derivatives Itching and Rash     Past Medical History, Surgical history, Social history, and Family History were reviewed and updated.  Review of Systems: All other 10 point review of systems is negative.   Physical Exam:  vitals were not taken for this visit.   Wt Readings from Last 3 Encounters:  08/07/19 223 lb 11.2 oz (101.5 kg)  07/18/19 238 lb 12.8 oz (108.3 kg)  06/08/19 253 lb 1.9 oz (114.8 kg)    Ocular: Sclerae unicteric, pupils equal, round and reactive to light Ear-nose-throat: Oropharynx clear, dentition fair Lymphatic: No cervical or  supraclavicular adenopathy Lungs no rales or rhonchi, good excursion bilaterally Heart regular rate and rhythm, no murmur appreciated Abd soft, nontender, positive bowel sounds, no liver or spleen tip palpated on exam, no fluid wave  MSK no focal spinal tenderness, no joint edema Neuro: non-focal, well-oriented, appropriate affect Breasts: Deferred   Lab Results  Component Value Date   WBC 5.2 09/28/2019   HGB 9.5 (L) 09/28/2019   HCT 30.6 (L) 09/28/2019   MCV 93.6 09/28/2019   PLT 113 (L) 09/28/2019   Lab Results  Component Value Date   FERRITIN 138 07/05/2019   IRON 22 (L) 07/05/2019   TIBC 337 07/05/2019   UIBC 315 07/05/2019   IRONPCTSAT 7 (L) 07/05/2019   Lab Results  Component Value Date   RETICCTPCT 2.8 07/05/2019   RBC 3.27 (L) 09/28/2019   RETICCTABS 73.3 01/28/2011   No results found for: KPAFRELGTCHN, LAMBDASER, KAPLAMBRATIO No results found for: IGGSERUM, IGA, IGMSERUM No results found for: Odetta Pink, SPEI   Chemistry      Component Value Date/Time   NA 140 09/28/2019 0621   NA 145 09/08/2017 1317   NA 142 02/25/2017 1013   K 3.1 (L) 09/28/2019 0621   K 4.5 09/08/2017 1317   K 3.8 02/25/2017 1013   CL 95 (L) 09/28/2019 0621   CL 98 09/08/2017 1317   CO2 32 09/28/2019 0621   CO2 32 09/08/2017 1317   CO2 26 02/25/2017 1013   BUN 37 (H) 09/28/2019 0621   BUN 34 (H)  09/08/2017 1317   BUN 20.1 02/25/2017 1013   CREATININE 1.61 (H) 09/28/2019 0621   CREATININE 2.56 (H) 05/25/2019 1401   CREATININE 1.9 (H) 09/08/2017 1317   CREATININE 1.8 (H) 02/25/2017 1013      Component Value Date/Time   CALCIUM 8.5 (L) 09/28/2019 0621   CALCIUM 9.3 09/08/2017 1317   CALCIUM 8.7 02/25/2017 1013   ALKPHOS 161 (H) 09/28/2019 0621   ALKPHOS 45 09/08/2017 1317   ALKPHOS 65 02/25/2017 1013   AST 54 (H) 09/28/2019 0621   AST 18 05/25/2019 1401   AST 16 02/25/2017 1013   ALT 52 (H) 09/28/2019 0621   ALT 12 05/25/2019 1401   ALT 26 09/08/2017 1317   ALT 13 02/25/2017 1013   BILITOT 2.5 (H) 09/28/2019 0621   BILITOT 0.7 05/25/2019 1401   BILITOT 0.76 02/25/2017 1013       Impression and Plan: Ms. Lundy is a very pleasant 85 yo caucasian female with immune based thrombocytopenia. We will proceed with Nplate today as planned.  Hgb is slightly improved to 8.3. I spoke with both Dr. Marin Olp and the patient and no blood needed at this time.  We will follow along closely with her monitoring labs work and symptoms. We will see her back in 2 weeks.  She will contact our office with any questions or concerns. We can certainly see her sooner if needed.   Laverna Peace, NP 1/25/20219:22 AM

## 2019-10-02 NOTE — Patient Instructions (Signed)
Romiplostim injection What is this medicine? ROMIPLOSTIM (roe mi PLOE stim) helps your body make more platelets. This medicine is used to treat low platelets caused by chronic idiopathic thrombocytopenic purpura (ITP). This medicine may be used for other purposes; ask your health care provider or pharmacist if you have questions. COMMON BRAND NAME(S): Nplate What should I tell my health care provider before I take this medicine? They need to know if you have any of these conditions:  bleeding disorders  bone marrow problem, like blood cancer or myelodysplastic syndrome  history of blood clots  liver disease  surgery to remove your spleen  an unusual or allergic reaction to romiplostim, mannitol, other medicines, foods, dyes, or preservatives  pregnant or trying to get pregnant  breast-feeding How should I use this medicine? This medicine is for injection under the skin. It is given by a health care professional in a hospital or clinic setting. A special MedGuide will be given to you before your injection. Read this information carefully each time. Talk to your pediatrician regarding the use of this medicine in children. While this drug may be prescribed for children as young as 1 year for selected conditions, precautions do apply. Overdosage: If you think you have taken too much of this medicine contact a poison control center or emergency room at once. NOTE: This medicine is only for you. Do not share this medicine with others. What if I miss a dose? It is important not to miss your dose. Call your doctor or health care professional if you are unable to keep an appointment. What may interact with this medicine? Interactions are not expected. This list may not describe all possible interactions. Give your health care provider a list of all the medicines, herbs, non-prescription drugs, or dietary supplements you use. Also tell them if you smoke, drink alcohol, or use illegal drugs.  Some items may interact with your medicine. What should I watch for while using this medicine? Your condition will be monitored carefully while you are receiving this medicine. Visit your prescriber or health care professional for regular checks on your progress and for the needed blood tests. It is important to keep all appointments. What side effects may I notice from receiving this medicine? Side effects that you should report to your doctor or health care professional as soon as possible:  allergic reactions like skin rash, itching or hives, swelling of the face, lips, or tongue  signs and symptoms of bleeding such as bloody or black, tarry stools; red or dark brown urine; spitting up blood or brown material that looks like coffee grounds; red spots on the skin; unusual bruising or bleeding from the eyes, gums, or nose  signs and symptoms of a blood clot such as chest pain; shortness of breath; pain, swelling, or warmth in the leg  signs and symptoms of a stroke like changes in vision; confusion; trouble speaking or understanding; severe headaches; sudden numbness or weakness of the face, arm or leg; trouble walking; dizziness; loss of balance or coordination Side effects that usually do not require medical attention (report to your doctor or health care professional if they continue or are bothersome):  headache  pain in arms and legs  pain in mouth  stomach pain This list may not describe all possible side effects. Call your doctor for medical advice about side effects. You may report side effects to FDA at 1-800-FDA-1088. Where should I keep my medicine? This drug is given in a hospital or clinic   and will not be stored at home. NOTE: This sheet is a summary. It may not cover all possible information. If you have questions about this medicine, talk to your doctor, pharmacist, or health care provider.  2020 [employer]/Gold Standard (2017-08-23 11:10:55)  

## 2019-10-03 ENCOUNTER — Inpatient Hospital Stay: Payer: Medicare Other

## 2019-10-03 ENCOUNTER — Telehealth: Payer: Self-pay | Admitting: Emergency Medicine

## 2019-10-03 NOTE — Progress Notes (Signed)
ED Antimicrobial Stewardship Positive Culture Follow Up   [name] is an 85 y.o. female who presented to [facility] on 09/28/2019 with a chief complaint of abdominal pain. PMH of nephrolithiasis. CT scan shows "No renal stones or obstructive uropathy. Distended gallbladder with gallstones. No pericholecystic inflammation." Surgery consulted-they do not feel patient has an acute abdomen and does not need to be admitted. Given dose of Ceftriaxone x 1 in ED based on UA.     Recent Results (from the past 720 hour(s))  Urine culture     Status: Abnormal   Collection Time: 09/28/19  6:33 AM   Specimen: Urine, Random  Result Value Ref Range Status   Specimen Description   Final    URINE, RANDOM Performed at [facility] [address], [place], [place] [address]    Special Requests   Final    NONE Performed at [facility], [place] [address], [place], [place] [address]    Culture >=100,000 COLONIES/mL HAFNIA ALVEI (A)  Final   Report Status 10/02/2019 FINAL  Final   Organism ID, Bacteria HAFNIA ALVEI (A)  Final      Susceptibility   Hafnia alvei - MIC*    AMPICILLIN RESISTANT Resistant     CEFAZOLIN >=64 RESISTANT Resistant     CEFTRIAXONE 0.5 SENSITIVE Sensitive     CIPROFLOXACIN <=0.25 SENSITIVE Sensitive     GENTAMICIN <=1 SENSITIVE Sensitive     IMIPENEM <=0.25 SENSITIVE Sensitive     NITROFURANTOIN <=16 SENSITIVE Sensitive     TRIMETH/SULFA <=20 SENSITIVE Sensitive     AMPICILLIN/SULBACTAM >=32 RESISTANT Resistant     PIP/TAZO 8 SENSITIVE Sensitive     * >=100,000 COLONIES/mL HAFNIA ALVEI    Plan: Call patient to see if having any urinary symptoms. If no symptoms, no further treatment needed. If patient having urinary symptoms, prescribe Nitrofurantoin 100mg  PO BID x 5 days.   ED Provider: Dr. Juliene Pina M 10/03/2019, 2:39 PM Clinical Pharmacist [phone]

## 2019-10-03 NOTE — Telephone Encounter (Signed)
Post ED Visit - Positive Culture Follow-up: Successful Patient Follow-Up  Culture assessed and recommendations reviewed by:  []  Elenor Quinones, Pharm.D. []  Heide Guile, Pharm.D., BCPS AQ-ID []  Parks Neptune, Pharm.D., BCPS []  Alycia Rossetti, Pharm.D., BCPS []  [place], Pharm.D., BCPS, AAHIVP []  Legrand Como, Pharm.D., BCPS, AAHIVP []  Salome Arnt, PharmD, BCPS []  Johnnette Gourd, PharmD, BCPS []  Hughes Better, PharmD, BCPS []  Leeroy Cha, PharmD  Positive urine culture  []  Patient discharged without antimicrobial prescription and treatment is now indicated []  Organism is resistant to prescribed ED discharge antimicrobial []  Patient with positive blood cultures  Changes discussed with ED provider:Dr Pattricia Boss New antibiotic prescription symptom check, if urinary symptoms, start nitrofurantoin 100mg  po twice daily x 5 days  Attempting to contact patient   [name] 10/03/2019, 1:15 PM

## 2019-10-05 ENCOUNTER — Inpatient Hospital Stay: Payer: Medicare Other | Admitting: Pulmonary Disease

## 2019-10-09 ENCOUNTER — Encounter: Payer: Self-pay | Admitting: Internal Medicine

## 2019-10-09 ENCOUNTER — Ambulatory Visit (INDEPENDENT_AMBULATORY_CARE_PROVIDER_SITE_OTHER): Payer: Medicare Other | Admitting: Internal Medicine

## 2019-10-09 ENCOUNTER — Ambulatory Visit: Payer: Medicare Other | Admitting: Internal Medicine

## 2019-10-09 ENCOUNTER — Other Ambulatory Visit: Payer: Self-pay

## 2019-10-09 DIAGNOSIS — E890 Postprocedural hypothyroidism: Secondary | ICD-10-CM | POA: Diagnosis not present

## 2019-10-09 DIAGNOSIS — Z794 Long term (current) use of insulin: Secondary | ICD-10-CM

## 2019-10-09 DIAGNOSIS — N184 Chronic kidney disease, stage 4 (severe): Secondary | ICD-10-CM

## 2019-10-09 DIAGNOSIS — E1122 Type 2 diabetes mellitus with diabetic chronic kidney disease: Secondary | ICD-10-CM

## 2019-10-09 DIAGNOSIS — E7800 Pure hypercholesterolemia, unspecified: Secondary | ICD-10-CM | POA: Diagnosis not present

## 2019-10-09 NOTE — Patient Instructions (Signed)
Please allow patient to keep her libre CGM in her room.  Please change: - Lantus 22 units at bedtime - Novolog  3 units before breakfast 3 units before lunch 4 units before dinner   Do not give Novolog at bedtime.  Call MD if sugars are <60 or >250s consistently.  Please continue levothyroxine 112 mcg daily  Take the thyroid hormone every day, with water, at least 30 minutes before breakfast, separated by at least 4 hours from: - acid reflux medications - calcium - iron - multivitamins  Please return in 3 months with your freestyle libre CGM.

## 2019-10-09 NOTE — Progress Notes (Signed)
Patient ID: [name], female   DOB: [birth year], 85 y.o.   MRN: [id number]   Patient location: [facility]) My location: Office Persons participating in the virtual visit: patient, provider, daughter  Referring Provider: Javier Glazier, MD  I connected with the patient on 10/09/19 at  4:38 PM EST by phone and verified that I am speaking with the correct person.   I discussed the limitations of evaluation and management by phone and the availability of in person appointments. The patient expressed understanding and agreed to proceed.   Details of the encounter are shown below.  HPI: [name] is a 85 y.o.-year-old female, returning for f/u for DM2, dx in late 1990s, insulin-dependent since 2012, uncontrolled, with complications (CKD stage 4, CHF, + DR) and hypothyroidism. She prev. Saw Dr. Chalmers Cater.  Last visit with me 8 months ago.  She is accompanied by her daughter who offers almost the entire medical information.  She had a UTI this month.  She was hospitalized for almost 1.5 months >> she was on a sliding scale.  She is on palliative care now.  DM2:  Last hemoglobin A1c was: 12/11/2018: HbA1c 6.9% Lab Results  Component Value Date   HGBA1C 7.3 (H) 07/19/2019   HGBA1C 6.3 (A) 02/22/2019   HGBA1C 7.3 (A) 09/08/2018  08/19/2017: HbA1c calculated from fructosamine is 6.7%  She is on (regimen changed 08/17/2019):  >> apparently not on it now for reviews of medications from the facility - Lantus 20 units 2x a day >> 35 units in HS actually given at the facility - NovoLog  before breakfast-lunch-dinner (not given at the facility) - NovoLog sliding scale:  <121: no insulin 121-140: 2 units ... Not following the below instructions at the facility: - 150-175: + 1 unit  - 176-200: + 2 units  - 201-225: + 3 units  - 226-250: + 4 units  - 251-275: + 5 units - 276-300: + 6 units >300: +7 units Call MD if sugars are above 300s consistently. She was previously  on glipizide.  She is not given the Libre CGM at the facility - this is locked in the cart.  She checks her sugars more than 4 times a day with her freestyle libre CGM.  We downloaded this: - am: 50-135 - 2h after b'fast: ? - lunch: 95-140 - 2h after lunch: ? - dinner: 130-180 (before or after dinner) - 2h after dinner: ? - bedtime: may be given SSI at bedtime, also -nighttime: 50s-90s   Lowest sugar was 295 >> ...77 >> 88 >> 50s; she has hypoglycemia awareness at 100. Highest sugar was 559 >> .Marland Kitchen.203 >> 258 >> 400 (steroids).  Glucometer: True Metrix air >> Freestyle Libre CGM  Pt's meals are: - Breakfast: grits, bacon, green tea + stevia >> eggs + bacon + toast/grits + fruit - Lunch: 1/2 sandwich + salad + chips and drink >> 1/2 sandwich - snack: fruit - Dinner:meat + veggies + occas. Starch >> meat + veggies - Snacks: 2   -+ Stage IV CKD, last BUN/creatinine:  Lab Results  Component Value Date   BUN 30 (H) 10/02/2019   BUN 37 (H) 09/28/2019   CREATININE 1.28 (H) 10/02/2019   CREATININE 1.61 (H) 09/28/2019  03/15/2018: 47/2.34, GFR 19, Glu 283  Reviewed GFR: Lab Results  Component Value Date   GFRNONAA 39 (L) 10/02/2019   GFRNONAA 29 (L) 09/28/2019   GFRNONAA 27 (L) 08/07/2019   GFRNONAA 28 (L) 08/06/2019   GFRNONAA  23 (L) 08/04/2019   GFRNONAA 20 (L) 08/03/2019   GFRNONAA 18 (L) 08/02/2019   GFRNONAA 18 (L) 08/01/2019   GFRNONAA 21 (L) 07/31/2019   GFRNONAA 21 (L) 07/30/2019  He has a h/o uric acid kidney stones.  -+ HL; last set of lipids: Lab Results  Component Value Date   CHOL 131 05/19/2018   HDL 26.70 (L) 05/19/2018   LDLCALC 65 08/16/2009   LDLDIRECT 41.0 05/19/2018   TRIG (H) 05/19/2018    656.0 Triglyceride is over 400; calculations on Lipids are invalid.   CHOLHDL 5 05/19/2018  On atorvastatin 40, fenofibrate 45, flaxseed oil 1000 mg twice a day - last eye exam was in 05/2018: + DR -+ Numbness and tingling in her toes  She has a h/o urinary  incontinence >> started Myrbetriq >> developed A fib >> started Eliquis >> had bleeding >> received blood transfusion.  Postsurgical (1960s) and postablative (1977 and 1999) hypothyroidism   Pt is on levothyroxine 112 mcg daily, taken: - in am - fasting - at least 30 min from b'fast - no Ca, Fe, MVI, PPIs, but takes Zantac later in the day. - not on Biotin  TFTs have been normal: Lab Results  Component Value Date   TSH 0.355 07/20/2019   TSH 0.838 07/04/2019   TSH 1.64 02/22/2019   TSH 0.92 05/19/2018   TSH 1.82 12/15/2016   TSH 0.87 09/11/2015   TSH 0.715 08/25/2015   TSH 2.19 06/11/2015   TSH 1.46 09/25/2011   TSH 1.82 06/10/2011   She had a mild MI at the beginning of 2019 and she also has CHF.   She also has immune thrombocytopenia and anemia.  ROS: Constitutional: no weight gain/+ weight loss, no fatigue, no subjective hyperthermia, no subjective hypothermia Eyes: no blurry vision, no xerophthalmia ENT: no sore throat, no nodules palpated in neck, no dysphagia, no odynophagia, no hoarseness Cardiovascular: no CP/no SOB/no palpitations/no leg swelling Respiratory: no cough/no SOB/no wheezing Gastrointestinal: no N/no V/no D/no C/no acid reflux Musculoskeletal: no muscle aches/no joint aches Skin: no rashes, no hair loss Neurological: no tremors/+ numbness/+ tingling/no dizziness  I reviewed pt's medications, allergies, PMH, social hx, family hx, and changes were documented in the history of present illness. Otherwise, unchanged from my initial visit note.  Past Medical History:  Diagnosis Date  . Allergic rhinitis   . Anxiety state, unspecified    panic attacks  . CHF (congestive heart failure) ([place])   . Depressive disorder, not elsewhere classified   . Extrinsic asthma, unspecified    no problem since adulthood  . Obesity   . OSA on CPAP    severe  . Pure hypercholesterolemia   . Respiratory failure with hypoxia ([place]) 09/2008   acute, secondary to multiple  bilateral pulmonary embolism , negative hypercoagulable workup 09/2008 hospital stay  . Scoliosis   . Type II or unspecified type diabetes mellitus without mention of complication, not stated as uncontrolled   . Unspecified essential hypertension   . Unspecified hypothyroidism    hypo   Past Surgical History:  Procedure Laterality Date  . CARDIOVERSION N/A 06/26/2019   Procedure: CARDIOVERSION;  Surgeon: Jerline Pain, MD;  Location: [facility] ENDOSCOPY;  Service: Cardiovascular;  Laterality: N/A;  . EYE SURGERY    . JOINT REPLACEMENT    . knee replaced    . THYROID SURGERY     Social History   Social History  . Marital status: Widowed    Spouse name: N/A  . Number  of children: 2   Occupational History  . 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 Smoking status: Former Smoker    Years: 20.00    Quit date: 09/07/1989  . Smokeless tobacco: Never Used  . Alcohol use No  . Drug use: No   Current Outpatient Medications on File Prior to Visit  Medication Sig Dispense Refill  . acetaminophen (TYLENOL) 325 MG tablet Take 650 mg by mouth every 6 (six) hours as needed for moderate pain or fever (for fever or pain/discomfort).     . Amino Acids-Protein Hydrolys (FEEDING SUPPLEMENT, PRO-STAT SUGAR FREE 64,) LIQD Take 30 mLs by mouth 2 (two) times daily. 887 mL 0  . amLODipine (NORVASC) 2.5 MG tablet Take 1 tablet (2.5 mg total) by mouth daily.    Marland Kitchen atorvastatin (LIPITOR) 40 MG tablet Take 40 mg by mouth at bedtime.     . bisacodyl (DULCOLAX) 5 MG EC tablet Take 10 mg by mouth every 3 (three) days as needed (for constipation).     . Cholecalciferol (VITAMIN D3) 2000 units TABS Take 2,000 Units by mouth daily.    Marland Kitchen docusate sodium (COLACE) 100 MG capsule Take 100 mg by mouth daily.     . fenofibrate 54 MG tablet TAKE 1 TABLET(54 MG) BY MOUTH DAILY (Patient taking differently: Take 54 mg by mouth daily. ) 90 tablet 1  . furosemide (LASIX) 20 MG  tablet Take 1 tablet (20 mg total) by mouth daily. Take 20 mg by mouth in the morning on Sun/Tues/Thurs/Sat 40mg  on Mon/Wed/Friday 30 tablet   . HYDROcodone-acetaminophen (NORCO/VICODIN) 5-325 MG tablet Take 1 tablet by mouth every 6 (six) hours as needed for moderate pain. 10 tablet 0  . insulin aspart (NOVOLOG) 100 UNIT/ML injection Inject 0-20 Units into the skin 3 (three) times daily with meals. CBG 121 - 150: 3 units  CBG 151 - 200: 4 units  CBG 201 - 250: 7 units  CBG 251 - 300: 11 units  CBG 301 - 350: 15 units  CBG 351 - 400: 20 units  CBG > 400 call MD 10 mL 11  . insulin glargine (LANTUS) 100 UNIT/ML injection Inject 0.25 mLs (25 Units total) into the skin at bedtime. 10 mL 11  . levothyroxine (SYNTHROID, LEVOTHROID) 112 MCG tablet TAKE 1 TABLET BY MOUTH DAILY (Patient taking differently: Take 112 mcg by mouth daily before breakfast. ) 90 tablet 0  . Multiple Vitamin (MULTIVITAMIN) tablet Take 1 tablet by mouth daily.      Marland Kitchen omeprazole (PRILOSEC) 20 MG capsule Take 20 mg by mouth daily.     . potassium citrate (UROCIT-K) 10 MEQ (1080 MG) SR tablet Take 2 tablets (20 mEq total) by mouth daily.    . predniSONE (DELTASONE) 20 MG tablet Take 40 mg daily. Taper by 10 mg every Friday.    . sertraline (ZOLOFT) 100 MG tablet Take 100 mg by mouth every evening.   4  . vitamin C (ASCORBIC ACID) 250 MG tablet Take 250 mg by mouth daily.     No current facility-administered medications on file prior to visit.   Allergies  Allergen Reactions  . Adhesive [Tape] Other (See Comments)    PULLS OFF THE SKIN  . Apixaban Other (See Comments)    Internal Bleeding  . Aspirin Itching, Rash, Hives and Swelling    Swelling of her tongue  . Mirabegron Other (See Comments)    Patient experienced A-Fib  .  Pineapple Anaphylaxis and Swelling    Throat swells and blisters on tongue and roof of mouth per patient  . Metformin Diarrhea and Nausea Only  . Tetracycline Hives  . Fluticasone-Salmeterol  Itching and Rash  . Iodinated Diagnostic Agents Itching and Rash       . Lactose Intolerance (Gi) Other (See Comments)    Gas   . Latex Itching, Rash and Other (See Comments)    Pulls off the skin and causes welts  . Lisinopril Cough  . Metronidazole Other (See Comments)    Reaction not known  . Sulfa Antibiotics Rash  . Sulfonamide Derivatives Itching and Rash   Family History  Problem Relation Age of Onset  . Allergies Mother   . Clotting disorder Mother   . Osteoarthritis Mother   . Asthma Mother   . Arthritis Other   . Diabetes Other   . Hyperlipidemia Other   . Hypertension Other   . Coronary artery disease Other   . Stroke Other   . Osteoarthritis Daughter   . Rheum arthritis Maternal Grandmother   . Clotting disorder Maternal Grandmother   . Clotting disorder Maternal Uncle   . Clotting disorder Daughter   . Allergies Daughter   . Breast cancer Neg Hx    PE: LMP  (LMP Unknown)  Wt Readings from Last 3 Encounters:  08/07/19 223 lb 11.2 oz (101.5 kg)  07/18/19 238 lb 12.8 oz (108.3 kg)  06/08/19 253 lb 1.9 oz (114.8 kg)   Constitutional:  in NAD  The physical exam was not performed (virtual visit).  ASSESSMENT: 1. DM2, insulin-dependent, uncontrolled, with complications - CKD stage 4 - CHF - + DR  2. Hypothyroidism  3. Hyperlipidemia  PLAN:  1. Patient with longstanding, uncontrolled, type 2 diabetes, insulin-dependent, previously on premixed insulin now on a basal-bolus insulin regimen.  At last visit, we also had her on a GLP-1 receptor agonist, but the facility stopped this when she was not eating well, was sick, and she was switched to palliative care.  However, she recovered since then and she is now eating better.  She still lost a significant amount of weight. -Per discussion with her the patient and daughter, at the facility, she is not administered the therapeutic regimen that I recommended at last visit.  Her dose of Lantus is 35 units at  bedtime, rather than the 20 units twice a day that I recommended.  As a consequence, she is dropping her sugars overnight, from the 50s to the 90s so patient tells me that she is afraid to go to sleep.  She is also given sliding scale at bedtime, which will further increase the risk of hypoglycemia overnight.  I will advise the facility to stop this practice.  She is not allowed to keep her libre CGM in her room, and this is locked in the cart.  I feel that this is unacceptable.  The patient absolutely needs to get her CGM so she can check her sugars throughout the day.  Also, she is not given the insulin doses recommended at the beginning of last month, but she is only given insulin based on sliding scale, between 0 and 15 units, depending on her sugars.  She has not given any NovoLog if her sugars are lower than 120.  This is not an ideal situation, she likely needs some mealtime insulin or to restart the Trulicity.  For now, I am a little reticent to restart this since she lost weight, but if  she continues to pick up weight, we can definitely restart it.  I would still suggest to give her mealtime insulin dose and stop the sliding scale.   -Reviewing the CGM downloads, sugars continue to fluctuate but they are usually low overnight, even lower the 70s and they increase throughout the day, with most values lower than 180.  The pattern is indicative of too much basal insulin and too little mealtime coverage. -For now, we discussed about decreasing Lantus to 22 units at night and giving her 3 units units of NovoLog before breakfast and lunch and 4 units before dinner. - I suggested to:  Patient Instructions  Please allow patient to keep her libre CGM in her room.  Please change: - Lantus 22 units at bedtime - Novolog  3 units before breakfast 3 units before lunch 4 units before dinner   Do not give Novolog at bedtime.  Call MD if sugars are <60 or >250s consistently.  Please continue levothyroxine  112 mcg daily  Take the thyroid hormone every day, with water, at least 30 minutes before breakfast, separated by at least 4 hours from: - acid reflux medications - calcium - iron - multivitamins  Please return in 3 months with your freestyle libre CGM.  - we will check her HbA1c when she returns to the clinic - advised to check sugars at different times of the day - 4x a day, before meals and at bedtime - advised for yearly eye exams >> she is UTD - return to clinic in 3-4 months  2. HL -Reviewed latest lipid panel from 05/2018: LDL at goal, triglycerides high, HDL low Lab Results  Component Value Date   CHOL 131 05/19/2018   HDL 26.70 (L) 05/19/2018   LDLCALC 65 08/16/2009   LDLDIRECT 41.0 05/19/2018   TRIG (H) 05/19/2018    656.0 Triglyceride is over 400; calculations on Lipids are invalid.   CHOLHDL 5 05/19/2018  -Continues the statin, fenofibrate, omega-3 fatty acids, with no side effects -She is due for another lipid panel when she returns to the clinic  3.  Hypothyroidism  - latest thyroid labs reviewed with pt >> normal: Lab Results  Component Value Date   TSH 0.355 07/20/2019   - she continues on LT4 112 mcg daily - pt feels good on this dose. - we discussed about taking the thyroid hormone every day, with water, >30 minutes before breakfast, separated by >4 hours from acid reflux medications, calcium, iron, multivitamins. Pt. is taking it correctly.  - time spent with the patient and her daughter: 24 minutes, of which >50% was spent in obtaining information about her symptoms, reviewing her previous labs, evaluations, and treatments, counseling her about her condition (please see the discussed topics above), and developing a plan to further investigate and treat it; she had a number of questions which I addressed.   Philemon Kingdom, MD PhD [facility]

## 2019-10-10 ENCOUNTER — Encounter: Payer: Self-pay | Admitting: Internal Medicine

## 2019-10-10 MED ORDER — INSULIN GLARGINE 100 UNIT/ML ~~LOC~~ SOLN: 22.0000 [IU] | Freq: Every day | SUBCUTANEOUS | 11 refills | Status: DC

## 2019-10-10 MED ORDER — INSULIN ASPART 100 UNIT/ML ~~LOC~~ SOLN: 3.0000 [IU] | Freq: Three times a day (TID) | SUBCUTANEOUS | 11 refills | Status: DC

## 2019-10-11 ENCOUNTER — Ambulatory Visit (INDEPENDENT_AMBULATORY_CARE_PROVIDER_SITE_OTHER): Payer: Medicare Other | Admitting: Adult Health

## 2019-10-11 ENCOUNTER — Inpatient Hospital Stay: Payer: Medicare Other | Admitting: Pulmonary Disease

## 2019-10-11 ENCOUNTER — Telehealth: Payer: Self-pay

## 2019-10-11 ENCOUNTER — Other Ambulatory Visit: Payer: Self-pay

## 2019-10-11 ENCOUNTER — Encounter: Payer: Self-pay | Admitting: Adult Health

## 2019-10-11 VITALS — BP 124/58 | HR 68 | Temp 97.0°F | Ht 61.0 in | Wt 214.6 lb

## 2019-10-11 DIAGNOSIS — J189 Pneumonia, unspecified organism: Secondary | ICD-10-CM | POA: Diagnosis not present

## 2019-10-11 DIAGNOSIS — J9611 Chronic respiratory failure with hypoxia: Secondary | ICD-10-CM | POA: Diagnosis not present

## 2019-10-11 DIAGNOSIS — G4733 Obstructive sleep apnea (adult) (pediatric): Secondary | ICD-10-CM

## 2019-10-11 NOTE — Telephone Encounter (Signed)
Faxed recent AVS to [employer]

## 2019-10-11 NOTE — Patient Instructions (Signed)
Restart CPAP At bedtime   Try to wear for at least 4hr each night.  Order for new supplies.  May use Oxygen 2l/m with activity and At bedtime  .  Goal is to keep oxygen level >90% .  Chest xray .  Activity as tolerated.  Follow up with Dr. Elsworth Soho  In 6-8 weeks and As needed   Please contact office for sooner follow up if symptoms do not improve or worsen or seek emergency care

## 2019-10-11 NOTE — Progress Notes (Signed)
@Patient  ID: [name], female    DOB: 1935-04-30, 85 y.o.   MRN: [id number]  Chief Complaint  Patient presents with  . Follow-up    Referring provider: Javier Glazier, MD  HPI: 85 year old female former smoker followed for severe obstructive sleep apnea on nocturnal CPAP  TEST/EVENTS :  Diagnostic PSG in 8/09 showed severe obstructive sleep apnea with AHI 85/h, TST 121 mins - no REM sleep. Subsequent titration study showed CPAP requirement of 15 cm with small comfort gel mask  02/16/2018-sleep study report- AHI 26  10/11/2019 Follow up : OSA and Pneumonitis  Patient returns for a follow-up visit.  Patient has underlying obstructive sleep apnea.  Has previously been on CPAP however has not worn it for the last several months.  Patient had a critical illness in November 2020 with a prolonged recovery.. Patient was also concerned that her CPAP may have been contributing to her illness as it was not being cleaned properly at her assisted living. We discussed her underlying sleep apnea.  And discussed her restarting this.  She would like order for all new supplies.  Patient was admitted for prolonged hospitalization in November for progressive shortness of breath.  She has a extensive medical history including chronic kidney disease, ITP, A. fib on Eliquis and history of PE in 2010.  Patient presented with severe shortness of breath and hypoxemia.  Patient was found to have bilateral infiltrates felt to be pneumonitis of unclear etiology.  Serology was negative.  She was treated with steroids.  CT chest showed widespread groundglass opacities and small bilateral pleural effusions.  She required high flow oxygen.  Was able to wean down gradually on steroids.  She did require BiPAP support initially.  She was weaned down from 5 to 6 L of oxygen and down to 2 prior to discharge.  And she was also weaned from prednisone 40 mg down to 10 mg daily.. Patient says she has had a very slow recovery  at her skilled nursing facility.  She was previously in assisted living.  She is unable to walk.  She is also had significant issues with urinary issues and now has a chronic Foley.  She recently broke her right ankle and is in a boot while doing physical therapy.  She does remain on oxygen 2 L.  Today in the office we checked her oxygen level at rest and she was able to maintain oxygen levels 96 to 97% on room air.  She is unable to walk to assess ambulatory sats. She says she is feeling better.  She remains very weak and has very low activity tolerance.  But as she is making slow gradual progress in the right direction  Allergies  Allergen Reactions  . Adhesive [Tape] Other (See Comments)    PULLS OFF THE SKIN  . Apixaban Other (See Comments)    Internal Bleeding  . Aspirin Itching, Rash, Hives and Swelling    Swelling of her tongue  . Mirabegron Other (See Comments)    Patient experienced A-Fib  . Pineapple Anaphylaxis and Swelling    Throat swells and blisters on tongue and roof of mouth per patient  . Metformin Diarrhea and Nausea Only  . Tetracycline Hives  . Fluticasone-Salmeterol Itching and Rash  . Iodinated Diagnostic Agents Itching and Rash       . Lactose Intolerance (Gi) Other (See Comments)    Gas   . Latex Itching, Rash and Other (See Comments)    Pulls off  the skin and causes welts  . Lisinopril Cough  . Metronidazole Other (See Comments)    Reaction not known  . Sulfa Antibiotics Rash  . Sulfonamide Derivatives Itching and Rash    Immunization History  Administered Date(s) Administered  . Influenza Split 06/09/2011  . Influenza Whole 07/23/2008, 06/13/2009, 06/07/2012  . Influenza, High Dose Seasonal PF 05/08/2015, 05/19/2018  . Pneumococcal Conjugate-13 12/15/2016  . Pneumococcal Polysaccharide-23 01/30/2008  . Pneumococcal-Unspecified 09/07/2013  . Td 01/25/2006  . Zoster 06/09/2011    Past Medical History:  Diagnosis Date  . Allergic rhinitis   .  Anxiety state, unspecified    panic attacks  . CHF (congestive heart failure) ([place])   . Depressive disorder, not elsewhere classified   . Extrinsic asthma, unspecified    no problem since adulthood  . Obesity   . OSA on CPAP    severe  . Pure hypercholesterolemia   . Respiratory failure with hypoxia ([place]) 09/2008   acute, secondary to multiple bilateral pulmonary embolism , negative hypercoagulable workup 09/2008 hospital stay  . Scoliosis   . Type II or unspecified type diabetes mellitus without mention of complication, not stated as uncontrolled   . Unspecified essential hypertension   . Unspecified hypothyroidism    hypo    Tobacco History: Social History   Tobacco Use  Smoking Status Former Smoker  . Packs/day: 0.25  . Years: 20.00  . Pack years: 5.00  . Types: Cigarettes  . Quit date: 09/07/1989  . Years since quitting: 30.1  Smokeless Tobacco Never Used   Counseling given: Not Answered   Outpatient Medications Prior to Visit  Medication Sig Dispense Refill  . acetaminophen (TYLENOL) 325 MG tablet Take 650 mg by mouth every 6 (six) hours as needed for moderate pain or fever (for fever or pain/discomfort).     . Amino Acids-Protein Hydrolys (FEEDING SUPPLEMENT, PRO-STAT SUGAR FREE 64,) LIQD Take 30 mLs by mouth 2 (two) times daily. 887 mL 0  . amLODipine (NORVASC) 2.5 MG tablet Take 1 tablet (2.5 mg total) by mouth daily.    Marland Kitchen atorvastatin (LIPITOR) 40 MG tablet Take 40 mg by mouth at bedtime.     . bisacodyl (DULCOLAX) 5 MG EC tablet Take 10 mg by mouth every 3 (three) days as needed (for constipation).     . Cholecalciferol (VITAMIN D3) 2000 units TABS Take 2,000 Units by mouth daily.    Marland Kitchen docusate sodium (COLACE) 100 MG capsule Take 100 mg by mouth daily.     . fenofibrate 54 MG tablet TAKE 1 TABLET(54 MG) BY MOUTH DAILY (Patient taking differently: Take 54 mg by mouth daily. ) 90 tablet 1  . furosemide (LASIX) 20 MG tablet Take 1 tablet (20 mg total) by mouth daily.  Take 20 mg by mouth in the morning on Sun/Tues/Thurs/Sat 40mg  on Mon/Wed/Friday 30 tablet   . HYDROcodone-acetaminophen (NORCO/VICODIN) 5-325 MG tablet Take 1 tablet by mouth every 6 (six) hours as needed for moderate pain. 10 tablet 0  . insulin aspart (NOVOLOG) 100 UNIT/ML injection Inject 3-4 Units into the skin 3 (three) times daily before meals. 10 mL 11  . insulin glargine (LANTUS) 100 UNIT/ML injection Inject 0.22 mLs (22 Units total) into the skin at bedtime. 10 mL 11  . levothyroxine (SYNTHROID, LEVOTHROID) 112 MCG tablet TAKE 1 TABLET BY MOUTH DAILY (Patient taking differently: Take 112 mcg by mouth daily before breakfast. ) 90 tablet 0  . Multiple Vitamin (MULTIVITAMIN) tablet Take 1 tablet by mouth daily.      Marland Kitchen  omeprazole (PRILOSEC) 20 MG capsule Take 20 mg by mouth daily.     . potassium citrate (UROCIT-K) 10 MEQ (1080 MG) SR tablet Take 2 tablets (20 mEq total) by mouth daily.    . predniSONE (DELTASONE) 20 MG tablet Take 40 mg daily. Taper by 10 mg every Friday.    . sertraline (ZOLOFT) 100 MG tablet Take 100 mg by mouth every evening.   4  . vitamin C (ASCORBIC ACID) 250 MG tablet Take 250 mg by mouth daily.     No facility-administered medications prior to visit.     Review of Systems:   Constitutional:   No  weight loss, night sweats,  Fevers, chills, + fatigue, or  lassitude.  HEENT:   No headaches,  Difficulty swallowing,  Tooth/dental problems, or  Sore throat,                No sneezing, itching, ear ache, nasal congestion, post nasal drip,   CV:  No chest pain,  Orthopnea, PND,+ swelling in lower extremities, anasarca, dizziness, palpitations, syncope.   GI  No heartburn, indigestion, abdominal pain, nausea, vomiting, diarrhea, change in bowel habits, loss of appetite, bloody stools.   Resp:    No chest wall deformity  Skin: no rash or lesions.  GU: Chronic Foley  MS:  No joint pain or swelling.  No decreased range of motion.  No back pain.    Physical  Exam  BP (!) 124/58 (BP Location: Left Arm, Cuff Size: Normal)   Pulse 68   Temp (!) 97 F (36.1 C) (Temporal)   Ht 5\' 1"  (1.549 m)   Wt 214 lb 9.6 oz (97.3 kg)   LMP  (LMP Unknown)   SpO2 100% Comment: 2 liters  BMI 40.55 kg/m   GEN: A/Ox3; pleasant , NAD, BMI 40 , wheelchair   HEENT:  [place]/AT,  , NOSE-clear, THROAT-clear, no lesions, no postnasal drip or exudate noted.   NECK:  Supple w/ fair ROM; no JVD; normal carotid impulses w/o bruits; no thyromegaly or nodules palpated; no lymphadenopathy.    RESP  Clear  P & A; w/o, wheezes/ rales/ or rhonchi. no accessory muscle use, no dullness to percussion  CARD:  RRR, no m/r/g, 1+peripheral edema, pulses intact, no cyanosis or clubbing.  GI:   Soft & nt; nml bowel sounds; no organomegaly or masses detected.   Musco: Warm bil, no deformities or joint swelling noted.  Right boot  Neuro: alert, no focal deficits noted.    Skin: Warm, no lesions or rashes    Lab Results:  CBC  BMET   Imaging: DG Ankle Complete Right  Result Date: 09/28/2019 CLINICAL DATA:  Right ankle pain. No known trauma. EXAM: RIGHT ANKLE - COMPLETE 3+ VIEW COMPARISON:  Right ankle radiographs 02/11/2017. FINDINGS: Mildly displaced fracture is present at the tip of the fibula. Associated soft tissue swelling is present. The joint is located. No additional fractures are present. Degenerative changes are present within the ankle joint. IMPRESSION: Mildly displaced fracture at the tip of the fibula with associated soft tissue swelling. Electronically Signed   By: San Morelle M.D.   On: 09/28/2019 07:05   CT RENAL STONE STUDY  Result Date: 09/28/2019 CLINICAL DATA:  Flank pain, kidney stone suspected right-sided abdominal pain. History of kidney stones. EXAM: CT ABDOMEN AND PELVIS WITHOUT CONTRAST TECHNIQUE: Multidetector CT imaging of the abdomen and pelvis was performed following the standard protocol without IV contrast. COMPARISON:  Renal ultrasound  07/29/2019, CT 06/15/2017  FINDINGS: Lower chest: Trace pleural effusions. Subpleural reticular opacities in both lower lobes. 16 mm right middle lobe nodule, with adjacent reticulonodular opacities. Hepatobiliary: Coarse calcification in the left lobe of the liver. Enlarged liver with steatosis, liver spans 22 cm cranial caudal. Distended gallbladder with calcified gallstones. No pericholecystic inflammation. No biliary dilatation. Pancreas: Fatty atrophy.  No ductal dilatation or inflammation. Spleen: Enlarged measuring 14.9 x 6.7 x 15.1 cm (volume = 790 cm^3). Adrenals/Urinary Tract: No adrenal nodule. No hydronephrosis. No renal or ureteral calculi. Mild symmetric bilateral perinephric edema. Urinary bladder is decompressed by Foley catheter. No bladder stones. Stomach/Bowel: Stomach is decompressed. Tiny duodenal diverticulum. No bowel obstruction or inflammation. There is laxity of the anterior abdominal wall musculature with abdomen leaning to the left. Broad-based lower ventral abdominal wall hernia versus pannus. Colonic diverticulosis without diverticulitis. Appendix not well visualized, no evidence of appendicitis. There is stool distending the rectum. Vascular/Lymphatic: Advanced aortic atherosclerosis. No aortic aneurysm. No abdominopelvic adenopathy. Reproductive: Calcified uterine fibroids. No adnexal mass. Other: No free air, free fluid, or intra-abdominal fluid collection. Musculoskeletal: Scoliosis and degenerative change in the spine. Atrophy bone island in the right pelvis. Thinning and laxity anterior abdominal wall musculature. IMPRESSION: 1. No renal stones or obstructive uropathy. 2. Hepatosplenomegaly and hepatic steatosis. 3. Distended gallbladder with gallstones. No pericholecystic inflammation. 4. Colonic diverticulosis without diverticulitis. 5. Reticulonodular opacities in the right middle lobe, including a 16 mm nodule. This is not entirely included in the field of view. Recommend chest  CT for complete evaluation. Trace pleural effusions and subpleural reticulation at the lung base. Aortic Atherosclerosis (ICD10-I70.0). Electronically Signed   By: Keith Rake M.D.   On: 09/28/2019 07:00   US Abdomen Limited RUQ  Result Date: 09/28/2019 CLINICAL DATA:  Upper abdominal pain EXAM: ULTRASOUND ABDOMEN LIMITED RIGHT UPPER QUADRANT COMPARISON:  CT abdomen and pelvis September 28, 2019 FINDINGS: Gallbladder: Within the gallbladder, there are echogenic foci which move and shadow consistent with cholelithiasis. Largest gallstone measures 1.1 cm in length. Gallbladder appears mildly distended without appreciable wall thickening or pericholecystic fluid. No sonographic Murphy sign noted by sonographer; patient is receiving pain medication which could mask sonographic Murphy sign. Common bile duct: Diameter: 3 mm. No intrahepatic or extrahepatic biliary duct dilatation. Liver: No focal lesion identified beyond a focal calcification in the left lobe, a likely granuloma, also seen on recent CT. Liver appears enlarged, also noted on recent CT. Within normal limits in parenchymal echogenicity. Portal vein is patent on color Doppler imaging with normal direction of blood flow towards the liver. Other: Right kidney is echogenic. IMPRESSION: 1. Cholelithiasis. Gallbladder mildly distended. No gallbladder wall thickening or pericholecystic fluid. Unable to accurately assess for potential Percell Miller sign due to pain medication presence. It may be prudent to consider nuclear medicine hepatobiliary imaging study to assess for cystic duct patency given these findings. 2. Small granuloma in the left lobe of the liver. No other focal liver lesions evident. Liver appears overall enlarged. 3. Echogenic right kidney, a finding likely indicative of a degree of medical renal disease. Electronically Signed   By: Lowella Grip III M.D.   On: 09/28/2019 08:12    romiPLOStim (NPLATE) injection 50 mcg    Date Action Dose  Route User   10/02/2019 1030 Given 50 mcg Subcutaneous (Right Arm) Bartko, Rebekah Chesterfield, RN      No flowsheet data found.  No results found for: NITRICOXIDE      Assessment & Plan:   No problem-specific Assessment & Plan  notes found for this encounter.     Rexene Edison, NP 10/11/2019

## 2019-10-12 ENCOUNTER — Telehealth: Payer: Self-pay | Admitting: Pulmonary Disease

## 2019-10-12 NOTE — Telephone Encounter (Signed)
I checked the fax machine, Dr. Bari Mantis mail slot and his desk. The fax has not been received as of this time stamp.

## 2019-10-12 NOTE — Telephone Encounter (Signed)
Called and spoke to [place]. She states pt had a CXR today and wanted to verify we received the faxed results.   Hinton Dyer, please advise if you have received the CXR result. It was just faxed. Thanks.

## 2019-10-12 NOTE — Telephone Encounter (Signed)
Pt had chest x-ray today and the results was faxed. Calling to see if results was received. The results was positive and she was wanting to inform Dr. Elsworth Soho.

## 2019-10-12 NOTE — Assessment & Plan Note (Signed)
Bilateral infiltrates steroid responsive unclear etiology.  Patient has clinically improved after being treated with steroids in November. She is making progress and is able to wean oxygen.  We will check chest x-ray today.  Unfortunately she is unable to stand for chest x-ray will have to order portable system at her facility  Plan  Patient Instructions  Restart CPAP At bedtime   Try to wear for at least 4hr each night.  Order for new supplies.  May use Oxygen 2l/m with activity and At bedtime  .  Goal is to keep oxygen level >90% .  Chest xray .  Activity as tolerated.  Follow up with Dr. Elsworth Soho  In 6-8 weeks and As needed   Please contact office for sooner follow up if symptoms do not improve or worsen or seek emergency care

## 2019-10-12 NOTE — Assessment & Plan Note (Signed)
Obstructive sleep apnea.  Patient with recent critical illness and prolonged recovery.  She needs to restart her nocturnal CPAP.  Patient education given.  To her and her daughter.  Will order all new supplies for patient.  Discussed keeping her machine clean she does have a so clean that will also be helpful  Plan  Patient Instructions  Restart CPAP At bedtime   Try to wear for at least 4hr each night.  Order for new supplies.  May use Oxygen 2l/m with activity and At bedtime  .  Goal is to keep oxygen level >90% .  Chest xray .  Activity as tolerated.  Follow up with Dr. Elsworth Soho  In 6-8 weeks and As needed   Please contact office for sooner follow up if symptoms do not improve or worsen or seek emergency care

## 2019-10-12 NOTE — Assessment & Plan Note (Signed)
Oxygen dependent respiratory failure.  Patient is improving with oxygen demands.  Patient may remove oxygen at rest.  Use 2 L of oxygen with activity.  When she is more mobile can see her oxygen needs with ambulation.

## 2019-10-13 ENCOUNTER — Telehealth: Payer: Self-pay | Admitting: Pulmonary Disease

## 2019-10-13 NOTE — Telephone Encounter (Signed)
Spoke with Caryl Ada, advised that we still have not received the fax with the CXR report, so no additional recs could be given at this time.  She will refax (verified fax #).  Will await fax.

## 2019-10-16 NOTE — Telephone Encounter (Signed)
Hinton Dyer - has this fax been received? Thanks.

## 2019-10-17 ENCOUNTER — Inpatient Hospital Stay: Payer: Medicare Other

## 2019-10-17 ENCOUNTER — Inpatient Hospital Stay: Payer: Medicare Other | Admitting: Hematology & Oncology

## 2019-10-17 NOTE — Telephone Encounter (Signed)
Dr. Elsworth Soho have you seen this report?

## 2019-10-17 NOTE — Telephone Encounter (Signed)
Spoke with [name] and asked that She refax the cxr  She wanted [place] to let Dr Elsworth Soho know that pt has been treated with zpack and zochephin 1 gm  Will await cxr report and then forward back to Dr Elsworth Soho

## 2019-10-17 NOTE — Telephone Encounter (Signed)
Called and requested it again, as we still have not received

## 2019-10-17 NOTE — Telephone Encounter (Signed)
No

## 2019-10-18 ENCOUNTER — Ambulatory Visit: Payer: Medicare Other | Admitting: Orthopaedic Surgery

## 2019-10-18 NOTE — Telephone Encounter (Signed)
Pt was seen by TP on 2/3 & CXR orderd CXR 2/4 small left basilar infx >> given Zpak  Overall infx seem improved from hosp admission

## 2019-10-18 NOTE — Telephone Encounter (Signed)
Pateint seen in clinic 10/11/19. When I called the contact number provided for [facility], they could not find a [place] listed, could not find the patient name either.  Will have to try again.

## 2019-10-18 NOTE — Telephone Encounter (Signed)
The fax was received this morning. Placed on Dr. Bari Mantis desk for review.  Dr. Elsworth Soho, did you want me to take a picture of it and send to your phone to review since you are at the hospital this week?

## 2019-10-18 NOTE — Telephone Encounter (Signed)
Has this report been received?

## 2019-10-19 ENCOUNTER — Inpatient Hospital Stay: Payer: Medicare Other

## 2019-10-19 ENCOUNTER — Inpatient Hospital Stay: Payer: Medicare Other | Admitting: Family

## 2019-10-20 ENCOUNTER — Inpatient Hospital Stay: Payer: Medicare Other | Attending: Hematology & Oncology

## 2019-10-20 ENCOUNTER — Inpatient Hospital Stay: Payer: Medicare Other

## 2019-10-20 ENCOUNTER — Encounter: Payer: Self-pay | Admitting: Family

## 2019-10-20 ENCOUNTER — Inpatient Hospital Stay (HOSPITAL_BASED_OUTPATIENT_CLINIC_OR_DEPARTMENT_OTHER): Payer: Medicare Other | Admitting: Family

## 2019-10-20 ENCOUNTER — Other Ambulatory Visit: Payer: Self-pay

## 2019-10-20 VITALS — BP 160/48 | HR 70 | Temp 97.3°F | Resp 18 | Ht 61.0 in | Wt 213.6 lb

## 2019-10-20 DIAGNOSIS — N184 Chronic kidney disease, stage 4 (severe): Secondary | ICD-10-CM

## 2019-10-20 DIAGNOSIS — D500 Iron deficiency anemia secondary to blood loss (chronic): Secondary | ICD-10-CM

## 2019-10-20 DIAGNOSIS — D649 Anemia, unspecified: Secondary | ICD-10-CM

## 2019-10-20 DIAGNOSIS — D693 Immune thrombocytopenic purpura: Secondary | ICD-10-CM

## 2019-10-20 DIAGNOSIS — D509 Iron deficiency anemia, unspecified: Secondary | ICD-10-CM | POA: Insufficient documentation

## 2019-10-20 LAB — CBC WITH DIFFERENTIAL (CANCER CENTER ONLY)
Abs Immature Granulocytes: 0.01 10*3/uL (ref 0.00–0.07)
Basophils Absolute: 0 10*3/uL (ref 0.0–0.1)
Basophils Relative: 1 %
Eosinophils Absolute: 0.2 10*3/uL (ref 0.0–0.5)
Eosinophils Relative: 4 %
HCT: 29.3 % — ABNORMAL LOW (ref 36.0–46.0)
Hemoglobin: 9.2 g/dL — ABNORMAL LOW (ref 12.0–15.0)
Immature Granulocytes: 0 %
Lymphocytes Relative: 16 %
Lymphs Abs: 0.7 10*3/uL (ref 0.7–4.0)
MCH: 28.9 pg (ref 26.0–34.0)
MCHC: 31.4 g/dL (ref 30.0–36.0)
MCV: 92.1 fL (ref 80.0–100.0)
Monocytes Absolute: 0.2 10*3/uL (ref 0.1–1.0)
Monocytes Relative: 6 %
Neutro Abs: 3.1 10*3/uL (ref 1.7–7.7)
Neutrophils Relative %: 73 %
Platelet Count: 139 10*3/uL — ABNORMAL LOW (ref 150–400)
RBC: 3.18 MIL/uL — ABNORMAL LOW (ref 3.87–5.11)
RDW: 17.5 % — ABNORMAL HIGH (ref 11.5–15.5)
WBC Count: 4.2 10*3/uL (ref 4.0–10.5)
nRBC: 0 % (ref 0.0–0.2)

## 2019-10-20 LAB — CMP (CANCER CENTER ONLY)
ALT: 9 U/L (ref 0–44)
AST: 18 U/L (ref 15–41)
Albumin: 3.4 g/dL — ABNORMAL LOW (ref 3.5–5.0)
Alkaline Phosphatase: 57 U/L (ref 38–126)
Anion gap: 7 (ref 5–15)
BUN: 24 mg/dL — ABNORMAL HIGH (ref 8–23)
CO2: 34 mmol/L — ABNORMAL HIGH (ref 22–32)
Calcium: 9.4 mg/dL (ref 8.9–10.3)
Chloride: 102 mmol/L (ref 98–111)
Creatinine: 1.37 mg/dL — ABNORMAL HIGH (ref 0.44–1.00)
GFR, Est AFR Am: 42 mL/min — ABNORMAL LOW (ref >60)
GFR, Estimated: 36 mL/min — ABNORMAL LOW (ref >60)
Glucose, Bld: 187 mg/dL — ABNORMAL HIGH (ref 70–99)
Potassium: 3.8 mmol/L (ref 3.5–5.1)
Sodium: 143 mmol/L (ref 135–145)
Total Bilirubin: 0.6 mg/dL (ref 0.3–1.2)
Total Protein: 6.4 g/dL — ABNORMAL LOW (ref 6.5–8.1)

## 2019-10-20 LAB — SAMPLE TO BLOOD BANK

## 2019-10-20 LAB — PLATELET BY CITRATE

## 2019-10-20 LAB — SAVE SMEAR(SSMR), FOR PROVIDER SLIDE REVIEW

## 2019-10-20 MED ORDER — ROMIPLOSTIM 250 MCG ~~LOC~~ SOLR
50.0000 ug | Freq: Once | SUBCUTANEOUS | Status: AC
  Administered 2019-10-20: 50 ug via SUBCUTANEOUS
  Filled 2019-10-20: qty 0.1

## 2019-10-20 NOTE — Patient Instructions (Signed)
Romiplostim injection What is this medicine? ROMIPLOSTIM (roe mi PLOE stim) helps your body make more platelets. This medicine is used to treat low platelets caused by chronic idiopathic thrombocytopenic purpura (ITP). This medicine may be used for other purposes; ask your health care provider or pharmacist if you have questions. COMMON BRAND NAME(S): Nplate What should I tell my health care provider before I take this medicine? They need to know if you have any of these conditions:  bleeding disorders  bone marrow problem, like blood cancer or myelodysplastic syndrome  history of blood clots  liver disease  surgery to remove your spleen  an unusual or allergic reaction to romiplostim, mannitol, other medicines, foods, dyes, or preservatives  pregnant or trying to get pregnant  breast-feeding How should I use this medicine? This medicine is for injection under the skin. It is given by a health care professional in a hospital or clinic setting. A special MedGuide will be given to you before your injection. Read this information carefully each time. Talk to your pediatrician regarding the use of this medicine in children. While this drug may be prescribed for children as young as 1 year for selected conditions, precautions do apply. Overdosage: If you think you have taken too much of this medicine contact a poison control center or emergency room at once. NOTE: This medicine is only for you. Do not share this medicine with others. What if I miss a dose? It is important not to miss your dose. Call your doctor or health care professional if you are unable to keep an appointment. What may interact with this medicine? Interactions are not expected. This list may not describe all possible interactions. Give your health care provider a list of all the medicines, herbs, non-prescription drugs, or dietary supplements you use. Also tell them if you smoke, drink alcohol, or use illegal drugs.  Some items may interact with your medicine. What should I watch for while using this medicine? Your condition will be monitored carefully while you are receiving this medicine. Visit your prescriber or health care professional for regular checks on your progress and for the needed blood tests. It is important to keep all appointments. What side effects may I notice from receiving this medicine? Side effects that you should report to your doctor or health care professional as soon as possible:  allergic reactions like skin rash, itching or hives, swelling of the face, lips, or tongue  signs and symptoms of bleeding such as bloody or black, tarry stools; red or dark brown urine; spitting up blood or brown material that looks like coffee grounds; red spots on the skin; unusual bruising or bleeding from the eyes, gums, or nose  signs and symptoms of a blood clot such as chest pain; shortness of breath; pain, swelling, or warmth in the leg  signs and symptoms of a stroke like changes in vision; confusion; trouble speaking or understanding; severe headaches; sudden numbness or weakness of the face, arm or leg; trouble walking; dizziness; loss of balance or coordination Side effects that usually do not require medical attention (report to your doctor or health care professional if they continue or are bothersome):  headache  pain in arms and legs  pain in mouth  stomach pain This list may not describe all possible side effects. Call your doctor for medical advice about side effects. You may report side effects to FDA at 1-800-FDA-1088. Where should I keep my medicine? This drug is given in a hospital or clinic   and will not be stored at home. NOTE: This sheet is a summary. It may not cover all possible information. If you have questions about this medicine, talk to your doctor, pharmacist, or health care provider.  2020 [employer]/Gold Standard (2017-08-23 11:10:55)  

## 2019-10-20 NOTE — Telephone Encounter (Signed)
Called and spoke with [facility], advised of the response received from Dr. Elsworth Soho. Information to be faxed to her attention at [phone].  Nothing further needed at this time.

## 2019-10-20 NOTE — Progress Notes (Signed)
Hematology and Oncology Follow Up Visit  [name] [id number] 1934/10/23 85 y.o. 10/20/2019   Principle Diagnosis:  Immune based thrombocytopenia History of PE Iron deficiency anemia Atrial fib  Current Therapy:   Nplate q week for platelet count < 100K IV Iron as indicated Eliquis 2.5 mg po BID   Interim History:  [name] is here today with her daughter for follow-up and injection. She is doing well and is feeling much better. Platelet count is stable at 139, Hgb 9.2 and WBC 4.2.  She denies fatigue and states that she is going to PT 3 days a week to work on walking. She is still wearing the support boots and in a wheelchair today.  No falls or syncopal episodes to report.  No fever, chills, n/v, cough, rash, dizziness, SOB, chest pain, palpitations, abdominal pain or changes in bowel or bladder habits.  She was told she had a small pneumonia last week and treated with Iv antibiotics along with a z-pack. She has not had any symptoms with this and finished the Z-pack yesterday.  No episodes of bleeding. No abnormal bruising or petechiae.  The numbness/tingling in her left pinky and ring fingers along with her feet is unchanged. She states that her endocrinologist re-evaluated her insuline regimen and changed her to a different regimen which she feels has helped better control her numbers.  She is eating well and staying hydrated. She was weighed yesterday and is stable at 213 lbs.   ECOG Performance Status: 3 - Symptomatic, >50% confined to bed  Medications:  Allergies as of 10/20/2019      Reactions   Adhesive [tape] Other (See Comments)   PULLS OFF THE SKIN   Apixaban Other (See Comments)   Internal Bleeding   Aspirin Itching, Rash, Hives, Swelling   Swelling of her tongue   Mirabegron Other (See Comments)   Patient experienced A-Fib   Pineapple Anaphylaxis, Swelling   Throat swells and blisters on tongue and roof of mouth per patient   Metformin Diarrhea,  Nausea Only   Tetracycline Hives   Fluticasone-salmeterol Itching, Rash   Iodinated Diagnostic Agents Itching, Rash      Lactose Intolerance (gi) Other (See Comments)   Gas   Latex Itching, Rash, Other (See Comments)   Pulls off the skin and causes welts   Lisinopril Cough   Metronidazole Other (See Comments)   Reaction not known   Sulfa Antibiotics Rash   Sulfonamide Derivatives Itching, Rash      Medication List       Accurate as of October 20, 2019  1:44 PM. If you have any questions, ask your nurse or doctor.        acetaminophen 325 MG tablet Commonly known as: TYLENOL Take 650 mg by mouth every 6 (six) hours as needed for moderate pain or fever (for fever or pain/discomfort).   amLODipine 2.5 MG tablet Commonly known as: NORVASC Take 1 tablet (2.5 mg total) by mouth daily.   atorvastatin 40 MG tablet Commonly known as: LIPITOR Take 40 mg by mouth at bedtime.   bisacodyl 5 MG EC tablet Commonly known as: DULCOLAX Take 10 mg by mouth every 3 (three) days as needed (for constipation).   docusate sodium 100 MG capsule Commonly known as: COLACE Take 100 mg by mouth daily.   feeding supplement (PRO-STAT SUGAR FREE 64) Liqd Take 30 mLs by mouth 2 (two) times daily.   fenofibrate 54 MG tablet TAKE 1 TABLET(54 MG) BY MOUTH DAILY What  changed: See the new instructions.   furosemide 20 MG tablet Commonly known as: LASIX Take 1 tablet (20 mg total) by mouth daily. Take 20 mg by mouth in the morning on Sun/Tues/Thurs/Sat 40mg  on Mon/Wed/Friday   HYDROcodone-acetaminophen 5-325 MG tablet Commonly known as: NORCO/VICODIN Take 1 tablet by mouth every 6 (six) hours as needed for moderate pain.   insulin aspart 100 UNIT/ML injection Commonly known as: novoLOG Inject 3-4 Units into the skin 3 (three) times daily before meals.   insulin glargine 100 UNIT/ML injection Commonly known as: LANTUS Inject 0.22 mLs (22 Units total) into the skin at bedtime.     levothyroxine 112 MCG tablet Commonly known as: SYNTHROID TAKE 1 TABLET BY MOUTH DAILY What changed: when to take this   multivitamin tablet Take 1 tablet by mouth daily.   omeprazole 20 MG capsule Commonly known as: PRILOSEC Take 20 mg by mouth daily.   potassium citrate 10 MEQ (1080 MG) SR tablet Commonly known as: UROCIT-K Take 2 tablets (20 mEq total) by mouth daily.   predniSONE 20 MG tablet Commonly known as: DELTASONE Take 40 mg daily. Taper by 10 mg every Friday.   sertraline 100 MG tablet Commonly known as: ZOLOFT Take 100 mg by mouth every evening.   vitamin C 250 MG tablet Commonly known as: ASCORBIC ACID Take 250 mg by mouth daily.   Vitamin D3 50 MCG (2000 UT) Tabs Take 2,000 Units by mouth daily.       Allergies:  Allergies  Allergen Reactions  . Adhesive [Tape] Other (See Comments)    PULLS OFF THE SKIN  . Apixaban Other (See Comments)    Internal Bleeding  . Aspirin Itching, Rash, Hives and Swelling    Swelling of her tongue  . Mirabegron Other (See Comments)    Patient experienced A-Fib  . Pineapple Anaphylaxis and Swelling    Throat swells and blisters on tongue and roof of mouth per patient  . Metformin Diarrhea and Nausea Only  . Tetracycline Hives  . Fluticasone-Salmeterol Itching and Rash  . Iodinated Diagnostic Agents Itching and Rash       . Lactose Intolerance (Gi) Other (See Comments)    Gas   . Latex Itching, Rash and Other (See Comments)    Pulls off the skin and causes welts  . Lisinopril Cough  . Metronidazole Other (See Comments)    Reaction not known  . Sulfa Antibiotics Rash  . Sulfonamide Derivatives Itching and Rash    Past Medical History, Surgical history, Social history, and Family History were reviewed and updated.  Review of Systems: All other 10 point review of systems is negative.   Physical Exam:  vitals were not taken for this visit.   Wt Readings from Last 3 Encounters:  10/11/19 214 lb 9.6 oz  (97.3 kg)  08/07/19 223 lb 11.2 oz (101.5 kg)  07/18/19 238 lb 12.8 oz (108.3 kg)    Ocular: Sclerae unicteric, pupils equal, round and reactive to light Ear-nose-throat: Oropharynx clear, dentition fair Lymphatic: No cervical or supraclavicular adenopathy Lungs no rales or rhonchi, good excursion bilaterally Heart regular rate and rhythm, no murmur appreciated Abd soft, nontender, positive bowel sounds, no liver or spleen tip palpated on exam, no fluid wave  MSK no focal spinal tenderness, no joint edema Neuro: non-focal, well-oriented, appropriate affect Breasts: Deferred   Lab Results  Component Value Date   WBC 3.7 (L) 10/02/2019   HGB 8.3 (L) 10/02/2019   HCT 27.7 (L) 10/02/2019  MCV 93.3 10/02/2019   PLT 154 10/02/2019   Lab Results  Component Value Date   FERRITIN 138 07/05/2019   IRON 22 (L) 07/05/2019   TIBC 337 07/05/2019   UIBC 315 07/05/2019   IRONPCTSAT 7 (L) 07/05/2019   Lab Results  Component Value Date   RETICCTPCT 2.8 07/05/2019   RBC 2.97 (L) 10/02/2019   RETICCTABS 73.3 01/28/2011   No results found for: KPAFRELGTCHN, LAMBDASER, KAPLAMBRATIO No results found for: IGGSERUM, IGA, IGMSERUM No results found for: Kathrynn Ducking, MSPIKE, SPEI   Chemistry      Component Value Date/Time   NA 142 10/02/2019 0910   NA 145 09/08/2017 1317   NA 142 02/25/2017 1013   K 3.6 10/02/2019 0910   K 4.5 09/08/2017 1317   K 3.8 02/25/2017 1013   CL 101 10/02/2019 0910   CL 98 09/08/2017 1317   CO2 34 (H) 10/02/2019 0910   CO2 32 09/08/2017 1317   CO2 26 02/25/2017 1013   BUN 30 (H) 10/02/2019 0910   BUN 34 (H) 09/08/2017 1317   BUN 20.1 02/25/2017 1013   CREATININE 1.28 (H) 10/02/2019 0910   CREATININE 1.9 (H) 09/08/2017 1317   CREATININE 1.8 (H) 02/25/2017 1013      Component Value Date/Time   CALCIUM 9.0 10/02/2019 0910   CALCIUM 9.3 09/08/2017 1317   CALCIUM 8.7 02/25/2017 1013   ALKPHOS 152 (H) 10/02/2019  0910   ALKPHOS 45 09/08/2017 1317   ALKPHOS 65 02/25/2017 1013   AST 22 10/02/2019 0910   AST 16 02/25/2017 1013   ALT 18 10/02/2019 0910   ALT 26 09/08/2017 1317   ALT 13 02/25/2017 1013   BILITOT 0.8 10/02/2019 0910   BILITOT 0.76 02/25/2017 1013       Impression and Plan: Ms. Dilday is a very pleasant 85 yo caucasian female with immune based thrombocytopenia. She received Nplate today as planned.  We will continue to keep a close eye on her and see her back in another 2 weeks.  They will contact our office with any questions or concerns. We can certainly see her sooner if needed.   Laverna Peace, NP 2/12/20211:44 PM

## 2019-10-23 ENCOUNTER — Telehealth: Payer: Self-pay | Admitting: Pulmonary Disease

## 2019-10-23 LAB — IRON AND TIBC
Iron: 48 ug/dL (ref 41–142)
Saturation Ratios: 18 % — ABNORMAL LOW (ref 21–57)
TIBC: 260 ug/dL (ref 236–444)
UIBC: 212 ug/dL (ref 120–384)

## 2019-10-23 LAB — FERRITIN: Ferritin: 216 ng/mL (ref 11–307)

## 2019-10-23 NOTE — Telephone Encounter (Signed)
CXR report has been received and is located in RA's inbox up front. Will route to [facility] for follow up.

## 2019-10-24 ENCOUNTER — Other Ambulatory Visit: Payer: Self-pay | Admitting: Family

## 2019-10-24 ENCOUNTER — Ambulatory Visit (INDEPENDENT_AMBULATORY_CARE_PROVIDER_SITE_OTHER): Payer: Medicare Other | Admitting: Orthopaedic Surgery

## 2019-10-24 ENCOUNTER — Other Ambulatory Visit: Payer: Self-pay

## 2019-10-24 ENCOUNTER — Encounter: Payer: Self-pay | Admitting: Orthopaedic Surgery

## 2019-10-24 ENCOUNTER — Ambulatory Visit (INDEPENDENT_AMBULATORY_CARE_PROVIDER_SITE_OTHER): Payer: Medicare Other

## 2019-10-24 DIAGNOSIS — S82831D Other fracture of upper and lower end of right fibula, subsequent encounter for closed fracture with routine healing: Secondary | ICD-10-CM

## 2019-10-24 DIAGNOSIS — M25571 Pain in right ankle and joints of right foot: Secondary | ICD-10-CM

## 2019-10-24 NOTE — Progress Notes (Signed)
Office Visit Note   Patient: [name]           Date of Birth: 31-Jan-1935           MRN: [id number] Visit Date: 10/24/2019              Requested by: Javier Glazier, MD [place],  [place] [id number] PCP: Javier Glazier, MD   Assessment & Plan: Visit Diagnoses:  1. Pain in right ankle and joints of right foot   2. Other closed fracture of distal end of right fibula with routine healing, subsequent encounter     Plan: I spoke with the patient and her daughter in length.  She does not need any type of splint or boot.  She can weight-bear as tolerated.  I fill out notes for the skilled nursing facility to get her up with mobility for gait training and balance and coordination as well as therapy for generalized conditioning.  All questions concerns were answered and addressed.  Follow-up can be as needed.  Follow-Up Instructions: Return if symptoms worsen or fail to improve.   Orders:  Orders Placed This Encounter  Procedures  . XR Ankle 2 Views Right   No orders of the defined types were placed in this encounter.     Procedures: No procedures performed   Clinical Data: No additional findings.   Subjective: Chief Complaint  Patient presents with  . Right Ankle - Pain  The fact the patient is a very pleasant 85 year old female who is staying in skilled nursing.  She is referred from the emergency room after being there about 3 weeks ago with chest pain that ended up being gallbladder issues.  They asked her if she was hurting anywhere else and she said she was having some right ankle pain and swelling.  She denies any injuries.  She had had a recent episode of gout.  X-rays were obtained in the ER and were suspicious for a small fracture of the tip of the fibula so they placed her in a cam walking boot.  That was 3 weeks ago.  She is having no ankle pain currently.  HPI  Review of Systems She currently denies any chest pain or shortness of breath.   She denies any fever, chills, nausea, vomiting  Objective: Vital Signs: LMP  (LMP Unknown)   Physical Exam She is alert and orient x3 and in no acute distress Ortho Exam Examination of her right ankle shows that she has a flatfoot deformity but otherwise a normal ankle exam.  There is no pain over the lateral or medial malleolus.  The ankle is ligamentously stable. Specialty Comments:  No specialty comments available.  Imaging: XR Ankle 2 Views Right  Result Date: 10/24/2019 X-rays of the right ankle showed no gross deformities.  The ankle mortise is intact.  I do not see a fracture.    PMFS History: Patient Active Problem List   Diagnosis Date Noted  . Shortness of breath   . Palliative care by specialist   . Goals of care, counseling/discussion   . Pneumonitis 07/14/2019  . Pressure injury of skin 07/10/2019  . HCAP (healthcare-associated pneumonia)   . Acute hypoxemic respiratory failure ([place]) 07/03/2019  . Atypical atrial flutter ([place])   . Normocytic anemia 05/18/2017  . Chronic ITP (idiopathic thrombocytopenia) ([facility]) 01/25/2017  . Chronic kidney disease (CKD) stage G4/A1, severely decreased glomerular filtration rate (GFR) between 15-29 mL/min/1.73 square meter and albuminuria creatinine ratio  less than 30 mg/g ([place]) 09/04/2015  . Chronic kidney disease 09/04/2015  . Respiratory failure ([place]) 08/24/2015  . GOUT 06/23/2010  . DERMATITIS 01/16/2010  . BACK PAIN 06/13/2009  . MYALGIA 06/13/2009  . Anxiety state 12/13/2008  . LEG EDEMA, CHRONIC 11/08/2008  . Sleep apnea 10/26/2008  . Hypothyroidism 09/27/2008  . HYPERCHOLESTEROLEMIA 09/27/2008  . OBESITY 09/27/2008  . DEPRESSION 09/27/2008  . Essential hypertension 09/27/2008  . ALLERGIC RHINITIS 09/27/2008  . ASTHMA, CHILDHOOD 09/27/2008   Past Medical History:  Diagnosis Date  . Allergic rhinitis   . Anxiety state, unspecified    panic attacks  . CHF (congestive heart failure) ([place])   . Depressive disorder,  not elsewhere classified   . Extrinsic asthma, unspecified    no problem since adulthood  . Obesity   . OSA on CPAP    severe  . Pure hypercholesterolemia   . Respiratory failure with hypoxia ([place]) 09/2008   acute, secondary to multiple bilateral pulmonary embolism , negative hypercoagulable workup 09/2008 hospital stay  . Scoliosis   . Type II or unspecified type diabetes mellitus without mention of complication, not stated as uncontrolled   . Unspecified essential hypertension   . Unspecified hypothyroidism    hypo    Family History  Problem Relation Age of Onset  . Allergies Mother   . Clotting disorder Mother   . Osteoarthritis Mother   . Asthma Mother   . Arthritis Other   . Diabetes Other   . Hyperlipidemia Other   . Hypertension Other   . Coronary artery disease Other   . Stroke Other   . Osteoarthritis Daughter   . Rheum arthritis Maternal Grandmother   . Clotting disorder Maternal Grandmother   . Clotting disorder Maternal Uncle   . Clotting disorder Daughter   . Allergies Daughter   . Breast cancer Neg Hx     Past Surgical History:  Procedure Laterality Date  . CARDIOVERSION N/A 06/26/2019   Procedure: CARDIOVERSION;  Surgeon: Jerline Pain, MD;  Location: [facility] ENDOSCOPY;  Service: Cardiovascular;  Laterality: N/A;  . EYE SURGERY    . JOINT REPLACEMENT    . knee replaced    . THYROID SURGERY     Social History   Occupational History  . Occupation: OWNER    Employer: [employer]    Comment: Self employed- runs [employer]  Tobacco Use  . Smoking status: Former Smoker    Packs/day: 0.25    Years: 20.00    Pack years: 5.00    Types: Cigarettes    Quit date: 09/07/1989    Years since quitting: 30.1  . Smokeless tobacco: Never Used  Substance and Sexual Activity  . Alcohol use: No  . Drug use: No  . Sexual activity: Not Currently

## 2019-10-25 NOTE — Telephone Encounter (Signed)
Caryl Ada needs the results and what to do for the patient [phone] said it cant wait till 2/22

## 2019-10-25 NOTE — Telephone Encounter (Signed)
Laverda Page is APP of the day today.Can you make sure Triage know that  the APP of the day is Beth? Thanks

## 2019-10-25 NOTE — Telephone Encounter (Signed)
I have reviewed chest x-ray report and responded to this on earlier thread from 2/4 Okay with BW recommendations

## 2019-10-25 NOTE — Telephone Encounter (Signed)
Will send to APP of the day to address. Hinton Dyer can you please get this report to Judson Roch. Thanks.

## 2019-10-25 NOTE — Telephone Encounter (Signed)
Located CXR report and Beth addressed and recommended: Because this is a f/u pna CXR then will need to evaluate if pt is still having symptoms then needs visit with office. If pt is doing well and improving then to repeat CXR in 4 weeks.   Called and spoke to pt. Pt states she feels very well and denies any SOB, chest discomfort, cough or fever. Pt aware to keep appt with Dr. Elsworth Soho on 11/13/2019. [place] and spoke with [place] and informed her of the recs.   CXR report placed back in Dr. Bari Mantis look at folder. Will forward to [facility] and Dr. Elsworth Soho as Juluis Rainier.

## 2019-10-31 ENCOUNTER — Ambulatory Visit: Payer: Medicare Other | Admitting: Pulmonary Disease

## 2019-11-02 ENCOUNTER — Inpatient Hospital Stay (HOSPITAL_BASED_OUTPATIENT_CLINIC_OR_DEPARTMENT_OTHER): Payer: Medicare Other | Admitting: Family

## 2019-11-02 ENCOUNTER — Inpatient Hospital Stay: Payer: Medicare Other

## 2019-11-02 ENCOUNTER — Encounter: Payer: Self-pay | Admitting: Family

## 2019-11-02 ENCOUNTER — Other Ambulatory Visit: Payer: Self-pay

## 2019-11-02 VITALS — BP 129/47 | HR 72 | Temp 97.3°F | Resp 18 | Ht 61.0 in | Wt 213.0 lb

## 2019-11-02 DIAGNOSIS — D693 Immune thrombocytopenic purpura: Secondary | ICD-10-CM

## 2019-11-02 DIAGNOSIS — N184 Chronic kidney disease, stage 4 (severe): Secondary | ICD-10-CM

## 2019-11-02 DIAGNOSIS — D500 Iron deficiency anemia secondary to blood loss (chronic): Secondary | ICD-10-CM | POA: Diagnosis not present

## 2019-11-02 LAB — CBC WITH DIFFERENTIAL (CANCER CENTER ONLY)
Abs Immature Granulocytes: 0.02 10*3/uL (ref 0.00–0.07)
Basophils Absolute: 0 10*3/uL (ref 0.0–0.1)
Basophils Relative: 1 %
Eosinophils Absolute: 0.2 10*3/uL (ref 0.0–0.5)
Eosinophils Relative: 4 %
HCT: 28.6 % — ABNORMAL LOW (ref 36.0–46.0)
Hemoglobin: 9.1 g/dL — ABNORMAL LOW (ref 12.0–15.0)
Immature Granulocytes: 1 %
Lymphocytes Relative: 17 %
Lymphs Abs: 0.7 10*3/uL (ref 0.7–4.0)
MCH: 29.2 pg (ref 26.0–34.0)
MCHC: 31.8 g/dL (ref 30.0–36.0)
MCV: 91.7 fL (ref 80.0–100.0)
Monocytes Absolute: 0.3 10*3/uL (ref 0.1–1.0)
Monocytes Relative: 8 %
Neutro Abs: 2.9 10*3/uL (ref 1.7–7.7)
Neutrophils Relative %: 69 %
Platelet Count: 136 10*3/uL — ABNORMAL LOW (ref 150–400)
RBC: 3.12 MIL/uL — ABNORMAL LOW (ref 3.87–5.11)
RDW: 18.1 % — ABNORMAL HIGH (ref 11.5–15.5)
WBC Count: 4.1 10*3/uL (ref 4.0–10.5)
nRBC: 0 % (ref 0.0–0.2)

## 2019-11-02 LAB — CMP (CANCER CENTER ONLY)
ALT: 9 U/L (ref 0–44)
AST: 16 U/L (ref 15–41)
Albumin: 3.5 g/dL (ref 3.5–5.0)
Alkaline Phosphatase: 44 U/L (ref 38–126)
Anion gap: 9 (ref 5–15)
BUN: 29 mg/dL — ABNORMAL HIGH (ref 8–23)
CO2: 31 mmol/L (ref 22–32)
Calcium: 8.9 mg/dL (ref 8.9–10.3)
Chloride: 102 mmol/L (ref 98–111)
Creatinine: 1.4 mg/dL — ABNORMAL HIGH (ref 0.44–1.00)
GFR, Est AFR Am: 40 mL/min — ABNORMAL LOW (ref >60)
GFR, Estimated: 35 mL/min — ABNORMAL LOW (ref >60)
Glucose, Bld: 329 mg/dL — ABNORMAL HIGH (ref 70–99)
Potassium: 3.3 mmol/L — ABNORMAL LOW (ref 3.5–5.1)
Sodium: 142 mmol/L (ref 135–145)
Total Bilirubin: 0.6 mg/dL (ref 0.3–1.2)
Total Protein: 6.6 g/dL (ref 6.5–8.1)

## 2019-11-02 MED ORDER — SODIUM CHLORIDE 0.9 % IV SOLN
510.0000 mg | Freq: Once | INTRAVENOUS | Status: AC
  Administered 2019-11-02: 510 mg via INTRAVENOUS
  Filled 2019-11-02: qty 510

## 2019-11-02 MED ORDER — ROMIPLOSTIM 250 MCG ~~LOC~~ SOLR
50.0000 ug | Freq: Once | SUBCUTANEOUS | Status: AC
  Administered 2019-11-02: 50 ug via SUBCUTANEOUS
  Filled 2019-11-02: qty 0.1

## 2019-11-02 NOTE — Patient Instructions (Signed)
Romiplostim injection What is this medicine? ROMIPLOSTIM (roe mi PLOE stim) helps your body make more platelets. This medicine is used to treat low platelets caused by chronic idiopathic thrombocytopenic purpura (ITP). This medicine may be used for other purposes; ask your health care provider or pharmacist if you have questions. COMMON BRAND NAME(S): Nplate What should I tell my health care provider before I take this medicine? They need to know if you have any of these conditions:  bleeding disorders  bone marrow problem, like blood cancer or myelodysplastic syndrome  history of blood clots  liver disease  surgery to remove your spleen  an unusual or allergic reaction to romiplostim, mannitol, other medicines, foods, dyes, or preservatives  pregnant or trying to get pregnant  breast-feeding How should I use this medicine? This medicine is for injection under the skin. It is given by a health care professional in a hospital or clinic setting. A special MedGuide will be given to you before your injection. Read this information carefully each time. Talk to your pediatrician regarding the use of this medicine in children. While this drug may be prescribed for children as young as 1 year for selected conditions, precautions do apply. Overdosage: If you think you have taken too much of this medicine contact a poison control center or emergency room at once. NOTE: This medicine is only for you. Do not share this medicine with others. What if I miss a dose? It is important not to miss your dose. Call your doctor or health care professional if you are unable to keep an appointment. What may interact with this medicine? Interactions are not expected. This list may not describe all possible interactions. Give your health care provider a list of all the medicines, herbs, non-prescription drugs, or dietary supplements you use. Also tell them if you smoke, drink alcohol, or use illegal drugs.  Some items may interact with your medicine. What should I watch for while using this medicine? Your condition will be monitored carefully while you are receiving this medicine. Visit your prescriber or health care professional for regular checks on your progress and for the needed blood tests. It is important to keep all appointments. What side effects may I notice from receiving this medicine? Side effects that you should report to your doctor or health care professional as soon as possible:  allergic reactions like skin rash, itching or hives, swelling of the face, lips, or tongue  signs and symptoms of bleeding such as bloody or black, tarry stools; red or dark brown urine; spitting up blood or brown material that looks like coffee grounds; red spots on the skin; unusual bruising or bleeding from the eyes, gums, or nose  signs and symptoms of a blood clot such as chest pain; shortness of breath; pain, swelling, or warmth in the leg  signs and symptoms of a stroke like changes in vision; confusion; trouble speaking or understanding; severe headaches; sudden numbness or weakness of the face, arm or leg; trouble walking; dizziness; loss of balance or coordination Side effects that usually do not require medical attention (report to your doctor or health care professional if they continue or are bothersome):  headache  pain in arms and legs  pain in mouth  stomach pain This list may not describe all possible side effects. Call your doctor for medical advice about side effects. You may report side effects to FDA at 1-800-FDA-1088. Where should I keep my medicine? This drug is given in a hospital or clinic   and will not be stored at home. NOTE: This sheet is a summary. It may not cover all possible information. If you have questions about this medicine, talk to your doctor, pharmacist, or health care provider.  2020 Elsevier/Gold Standard (2017-08-23 11:10:55) Ferumoxytol injection What is  this medicine? FERUMOXYTOL is an iron complex. Iron is used to make healthy red blood cells, which carry oxygen and nutrients throughout the body. This medicine is used to treat iron deficiency anemia. This medicine may be used for other purposes; ask your health care provider or pharmacist if you have questions. COMMON BRAND NAME(S): Feraheme What should I tell my health care provider before I take this medicine? They need to know if you have any of these conditions:  anemia not caused by low iron levels  high levels of iron in the blood  magnetic resonance imaging (MRI) test scheduled  an unusual or allergic reaction to iron, other medicines, foods, dyes, or preservatives  pregnant or trying to get pregnant  breast-feeding How should I use this medicine? This medicine is for injection into a vein. It is given by a health care professional in a hospital or clinic setting. Talk to your pediatrician regarding the use of this medicine in children. Special care may be needed. Overdosage: If you think you have taken too much of this medicine contact a poison control center or emergency room at once. NOTE: This medicine is only for you. Do not share this medicine with others. What if I miss a dose? It is important not to miss your dose. Call your doctor or health care professional if you are unable to keep an appointment. What may interact with this medicine? This medicine may interact with the following medications:  other iron products This list may not describe all possible interactions. Give your health care provider a list of all the medicines, herbs, non-prescription drugs, or dietary supplements you use. Also tell them if you smoke, drink alcohol, or use illegal drugs. Some items may interact with your medicine. What should I watch for while using this medicine? Visit your doctor or healthcare professional regularly. Tell your doctor or healthcare professional if your symptoms do not  start to get better or if they get worse. You may need blood work done while you are taking this medicine. You may need to follow a special diet. Talk to your doctor. Foods that contain iron include: whole grains/cereals, dried fruits, beans, or peas, leafy green vegetables, and organ meats (liver, kidney). What side effects may I notice from receiving this medicine? Side effects that you should report to your doctor or health care professional as soon as possible:  allergic reactions like skin rash, itching or hives, swelling of the face, lips, or tongue  breathing problems  changes in blood pressure  feeling faint or lightheaded, falls  fever or chills  flushing, sweating, or hot feelings  swelling of the ankles or feet Side effects that usually do not require medical attention (report to your doctor or health care professional if they continue or are bothersome):  diarrhea  headache  nausea, vomiting  stomach pain This list may not describe all possible side effects. Call your doctor for medical advice about side effects. You may report side effects to FDA at 1-800-FDA-1088. Where should I keep my medicine? This drug is given in a hospital or clinic and will not be stored at home. NOTE: This sheet is a summary. It may not cover all possible information. If you have  questions about this medicine, talk to your doctor, pharmacist, or health care provider.  2020 Elsevier/Gold Standard (2016-10-12 20:21:10)

## 2019-11-02 NOTE — Progress Notes (Addendum)
Hematology and Oncology Follow Up Visit  [name] [id number] October 24, 1934 85 y.o. 11/02/2019   Principle Diagnosis:  Immune based thrombocytopenia History of PE Iron deficiency anemia Atrial fib  Current Therapy:   Nplate q week for platelet count < 100K IV Iron as indicated Eliquis 2.5 mg po BID   Interim History:  [name] is here today with her daughter for follow-up and Nplate injection. She continues to improve and was able to walk 19 feet earlier this week during PT. She is so excited about this! Her support boots were discontinued.  No falls or syncopal episodes to report.  The swelling in her lower extremities is stable. Pedal pulses are 1+.  The numbness and tingling in the pinky and ring fingers of her left hand is unchanged.  Platelet count today is stable at 136 and Hgb is 9.1.  No fever, chills, n/v, cough, rash, dizziness, chest pain, palpitations, abdominal pain or changes in bowel bladder habits.  She still has a urinary catheter in place.  Her SOB is much improved. She supplemental O2 available if needed but states that she rarely uses it anymore.  She has maintained a good appetite and is staying well hydrated. Her weight is stable.   ECOG Performance Status: 1 - Symptomatic but completely ambulatory  Medications:  Allergies as of 11/02/2019      Reactions   Adhesive [tape] Other (See Comments)   PULLS OFF THE SKIN   Apixaban Other (See Comments)   Internal Bleeding   Aspirin Itching, Rash, Hives, Swelling   Swelling of her tongue   Mirabegron Other (See Comments)   Patient experienced A-Fib   Pineapple Anaphylaxis, Swelling   Throat swells and blisters on tongue and roof of mouth per patient   Metformin Diarrhea, Nausea Only   Tetracycline Hives   Fluticasone-salmeterol Itching, Rash   Iodinated Diagnostic Agents Itching, Rash      Lactose Intolerance (gi) Other (See Comments)   Gas   Latex Itching, Rash, Other (See Comments)   Pulls off  the skin and causes welts   Lisinopril Cough   Metronidazole Other (See Comments)   Reaction not known   Sulfa Antibiotics Rash   Sulfonamide Derivatives Itching, Rash      Medication List       Accurate as of November 02, 2019 10:06 AM. If you have any questions, ask your nurse or doctor.        acetaminophen 325 MG tablet Commonly known as: TYLENOL Take 650 mg by mouth every 6 (six) hours as needed for moderate pain or fever (for fever or pain/discomfort).   amLODipine 2.5 MG tablet Commonly known as: NORVASC Take 1 tablet (2.5 mg total) by mouth daily.   atorvastatin 40 MG tablet Commonly known as: LIPITOR Take 40 mg by mouth at bedtime.   bisacodyl 5 MG EC tablet Commonly known as: DULCOLAX Take 10 mg by mouth every 3 (three) days as needed (for constipation).   docusate sodium 100 MG capsule Commonly known as: COLACE Take 100 mg by mouth daily.   feeding supplement (PRO-STAT SUGAR FREE 64) Liqd Take 30 mLs by mouth 2 (two) times daily.   fenofibrate 54 MG tablet TAKE 1 TABLET(54 MG) BY MOUTH DAILY What changed: See the new instructions.   furosemide 20 MG tablet Commonly known as: LASIX Take 1 tablet (20 mg total) by mouth daily. Take 20 mg by mouth in the morning on Sun/Tues/Thurs/Sat 40mg  on Mon/Wed/Friday   HYDROcodone-acetaminophen 5-325 MG tablet  Commonly known as: NORCO/VICODIN Take 1 tablet by mouth every 6 (six) hours as needed for moderate pain.   insulin aspart 100 UNIT/ML injection Commonly known as: novoLOG Inject 3-4 Units into the skin 3 (three) times daily before meals.   insulin glargine 100 UNIT/ML injection Commonly known as: LANTUS Inject 0.22 mLs (22 Units total) into the skin at bedtime.   levothyroxine 112 MCG tablet Commonly known as: SYNTHROID TAKE 1 TABLET BY MOUTH DAILY What changed: when to take this   multivitamin tablet Take 1 tablet by mouth daily.   omeprazole 20 MG capsule Commonly known as: PRILOSEC Take 20 mg  by mouth daily.   potassium citrate 10 MEQ (1080 MG) SR tablet Commonly known as: UROCIT-K Take 2 tablets (20 mEq total) by mouth daily.   predniSONE 20 MG tablet Commonly known as: DELTASONE Take 40 mg daily. Taper by 10 mg every Friday.   sertraline 100 MG tablet Commonly known as: ZOLOFT Take 100 mg by mouth every evening.   vitamin C 250 MG tablet Commonly known as: ASCORBIC ACID Take 250 mg by mouth daily.   Vitamin D3 50 MCG (2000 UT) Tabs Take 2,000 Units by mouth daily.       Allergies:  Allergies  Allergen Reactions  . Adhesive [Tape] Other (See Comments)    PULLS OFF THE SKIN  . Apixaban Other (See Comments)    Internal Bleeding  . Aspirin Itching, Rash, Hives and Swelling    Swelling of her tongue  . Mirabegron Other (See Comments)    Patient experienced A-Fib  . Pineapple Anaphylaxis and Swelling    Throat swells and blisters on tongue and roof of mouth per patient  . Metformin Diarrhea and Nausea Only  . Tetracycline Hives  . Fluticasone-Salmeterol Itching and Rash  . Iodinated Diagnostic Agents Itching and Rash       . Lactose Intolerance (Gi) Other (See Comments)    Gas   . Latex Itching, Rash and Other (See Comments)    Pulls off the skin and causes welts  . Lisinopril Cough  . Metronidazole Other (See Comments)    Reaction not known  . Sulfa Antibiotics Rash  . Sulfonamide Derivatives Itching and Rash    Past Medical History, Surgical history, Social history, and Family History were reviewed and updated.  Review of Systems: All other 10 point review of systems is negative.   Physical Exam:  [name] is 5\' 3"  (1.6 m) and weight is 213 lb (96.6 kg). Her temporal temperature is 97.3 F (36.3 C) (abnormal). Her blood pressure is 129/47 (abnormal) and her pulse is 72. Her respiration is 18 and oxygen saturation is 100%.   Wt Readings from Last 3 Encounters:  11/02/19 213 lb (96.6 kg)  10/20/19 213 lb 9.6 oz (96.9 kg)  10/11/19 214 lb 9.6 oz  (97.3 kg)    Ocular: Sclerae unicteric, pupils equal, round and reactive to light Ear-nose-throat: Oropharynx clear, dentition fair Lymphatic: No cervical or supraclavicular adenopathy Lungs no rales or rhonchi, good excursion bilaterally Heart regular rate and rhythm, no murmur appreciated Abd soft, nontender, positive bowel sounds, no liver or spleen tip palpated on exam, no fluid wave  MSK no focal spinal tenderness, no joint edema Neuro: non-focal, well-oriented, appropriate affect Breasts: Deferred   Lab Results  Component Value Date   WBC 4.1 11/02/2019   HGB 9.1 (L) 11/02/2019   HCT 28.6 (L) 11/02/2019   MCV 91.7 11/02/2019   PLT 136 (L) 11/02/2019   Lab  Results  Component Value Date   FERRITIN 216 10/20/2019   IRON 48 10/20/2019   TIBC 260 10/20/2019   UIBC 212 10/20/2019   IRONPCTSAT 18 (L) 10/20/2019   Lab Results  Component Value Date   RETICCTPCT 2.8 07/05/2019   RBC 3.12 (L) 11/02/2019   RETICCTABS 73.3 01/28/2011   No results found for: KPAFRELGTCHN, LAMBDASER, KAPLAMBRATIO No results found for: IGGSERUM, IGA, IGMSERUM No results found for: Odetta Pink, SPEI   Chemistry      Component Value Date/Time   NA 143 10/20/2019 1330   NA 145 09/08/2017 1317   NA 142 02/25/2017 1013   K 3.8 10/20/2019 1330   K 4.5 09/08/2017 1317   K 3.8 02/25/2017 1013   CL 102 10/20/2019 1330   CL 98 09/08/2017 1317   CO2 34 (H) 10/20/2019 1330   CO2 32 09/08/2017 1317   CO2 26 02/25/2017 1013   BUN 24 (H) 10/20/2019 1330   BUN 34 (H) 09/08/2017 1317   BUN 20.1 02/25/2017 1013   CREATININE 1.37 (H) 10/20/2019 1330   CREATININE 1.9 (H) 09/08/2017 1317   CREATININE 1.8 (H) 02/25/2017 1013      Component Value Date/Time   CALCIUM 9.4 10/20/2019 1330   CALCIUM 9.3 09/08/2017 1317   CALCIUM 8.7 02/25/2017 1013   ALKPHOS 57 10/20/2019 1330   ALKPHOS 45 09/08/2017 1317   ALKPHOS 65 02/25/2017 1013   AST 18  10/20/2019 1330   AST 16 02/25/2017 1013   ALT 9 10/20/2019 1330   ALT 26 09/08/2017 1317   ALT 13 02/25/2017 1013   BILITOT 0.6 10/20/2019 1330   BILITOT 0.76 02/25/2017 1013       Impression and Plan: Ms. Hagwood is a very pleasant 85yo caucasian female with immune based thrombocytopenia. She received Nplate today as well as IV iron for iron saturation of 18%.  We will see her again in another 2 weeks.  They will contact our office with any questions or concerns. We can certainly see her sooner if needed.   Laverna Peace, NP 2/25/202110:06 AM

## 2019-11-03 ENCOUNTER — Ambulatory Visit: Payer: Medicare Other | Admitting: Family

## 2019-11-03 ENCOUNTER — Ambulatory Visit: Payer: Medicare Other

## 2019-11-03 ENCOUNTER — Other Ambulatory Visit: Payer: Medicare Other

## 2019-11-07 ENCOUNTER — Telehealth: Payer: Self-pay | Admitting: Internal Medicine

## 2019-11-07 NOTE — Telephone Encounter (Signed)
Patient called stating her blood sugar levels are high (consistantly staying around 209) and patient would like Dr Cruzita Lederer or nurse to give her a call to advise. Ph# [phone]

## 2019-11-09 NOTE — Telephone Encounter (Signed)
Message left on machine to call me.  Telephone number given.

## 2019-11-09 NOTE — Telephone Encounter (Signed)
Per my records, she is on the following regimen: - Lantus 22 units at bedtime - Novolog  3 units before breakfast 3 units before lunch 4 units before dinner  Do not give Novolog at bedtime.  We can increase Lantus to 26 units and the Novolog to  4 units before breakfast 4 units before lunch 5 units before dinner

## 2019-11-09 NOTE — Telephone Encounter (Addendum)
Patient reports that since Dr. Renne Crigler reduced her insulin dose, her blood sugars have been in the 200s all the time. FBS today was 219, yesterday it was 246 fasting, 288 acL, 239acS and 222 at HS. She is not certain how much insulin she is taking, since the staff gives her her insulin doses.

## 2019-11-10 NOTE — Telephone Encounter (Signed)
Faxed to facility.

## 2019-11-13 ENCOUNTER — Telehealth: Payer: Self-pay | Admitting: Pulmonary Disease

## 2019-11-13 ENCOUNTER — Other Ambulatory Visit: Payer: Self-pay

## 2019-11-13 ENCOUNTER — Encounter: Payer: Self-pay | Admitting: Pulmonary Disease

## 2019-11-13 ENCOUNTER — Ambulatory Visit (INDEPENDENT_AMBULATORY_CARE_PROVIDER_SITE_OTHER): Payer: Medicare Other | Admitting: Pulmonary Disease

## 2019-11-13 DIAGNOSIS — G4733 Obstructive sleep apnea (adult) (pediatric): Secondary | ICD-10-CM

## 2019-11-13 DIAGNOSIS — J9611 Chronic respiratory failure with hypoxia: Secondary | ICD-10-CM

## 2019-11-13 DIAGNOSIS — J189 Pneumonia, unspecified organism: Secondary | ICD-10-CM

## 2019-11-13 NOTE — Assessment & Plan Note (Signed)
Resolved, recently treated with antibiotics in February and chest x-ray report shows improvement No leukocytosis on last blood work No further antibiotics required

## 2019-11-13 NOTE — Assessment & Plan Note (Signed)
She will get back on her CPAP machine -due to her long hospitalization, she was unable to use this but now is willing to get back on it.  I do not think she needs any new studies. We will review a download report and eventually get her new CPAP machine with auto settings 5 to 15 cm due to her severe weight loss

## 2019-11-13 NOTE — Telephone Encounter (Signed)
Called [name] but she didn't answer. Left message for her to call back.

## 2019-11-13 NOTE — Progress Notes (Signed)
   Subjective:    Patient ID: [name], female    DOB: 1935/06/02, 85 y.o.   MRN: [id number]  HPI  85 year old ex-smoker followed for severe obstructive sleep apnea on nocturnal CPAP  was admitted for prolonged hospitalization in November for progressive shortness of breath.  .  Patient presented with severe shortness of breath and hypoxemia.  Patient was found to have bilateral infiltrates felt to be pneumonitis of unclear etiology.  Serology was negative.  She was treated with steroids.  CT chest showed widespread groundglass opacities and small bilateral pleural effusions.  She required high flow oxygen.   She recently broke her right ankle and is in a boot while doing physical therapy.  She weaned off prednisone slowly.  Sugars still high because her insulin dose was decreased, managed by Dr. Cruzita Lederer. She is now able to walk about 20 feet with a walker.  ITP has improved, last labs 2/25 were reviewed which show hemoglobin of 9.1 platelets 136, no leukocytosis, creatinine 1.4.  She is on daily Lasix. She still has a Foley which is more because of convenience, pure wick caused ulceration of her vulvar area  History also obtained from daughter and papers from [facility] were reviewed  PMH -  chronic kidney disease, ITP, A. fib on Eliquis and history of PE in 2010  Significant tests/ events reviewed    Diagnostic PSG in 8/09 showed severe obstructive sleep apnea with AHI 85/h, TST 121 mins - no REM sleep. Subsequent titration study showed CPAP requirement of 15 cm with small comfort gel mask  Past Medical History:  Diagnosis Date  . Allergic rhinitis   . Anxiety state, unspecified    panic attacks  . CHF (congestive heart failure) ([place])   . Depressive disorder, not elsewhere classified   . Extrinsic asthma, unspecified    no problem since adulthood  . Obesity   . OSA on CPAP    severe  . Pure hypercholesterolemia   . Respiratory failure with hypoxia ([place])  09/2008   acute, secondary to multiple bilateral pulmonary embolism , negative hypercoagulable workup 09/2008 hospital stay  . Scoliosis   . Type II or unspecified type diabetes mellitus without mention of complication, not stated as uncontrolled   . Unspecified essential hypertension   . Unspecified hypothyroidism    hypo     Review of Systems neg for any significant sore throat, dysphagia, itching, sneezing, nasal congestion or excess/ purulent secretions, fever, chills, sweats, unintended wt loss, pleuritic or exertional cp, hempoptysis, orthopnea pnd or change in chronic leg swelling. Also denies presyncope, palpitations, heartburn, abdominal pain, nausea, vomiting, diarrhea or change in bowel or urinary habits, dysuria,hematuria, rash, arthralgias, visual complaints, headache, numbness weakness or ataxia.     Objective:   Physical Exam   Gen. Pleasant, obese, in no distress, normal affect ENT - no pallor,icterus, no post nasal drip, class 2-3 airway Neck: No JVD, no thyromegaly, no carotid bruits Lungs: no use of accessory muscles, no dullness to percussion, decreased with LLL rales no rhonchi  Cardiovascular: Rhythm regular, heart sounds  normal, no murmurs or gallops, no peripheral edema Abdomen: soft and non-tender, no hepatosplenomegaly, BS normal. Musculoskeletal: No deformities, no cyanosis or clubbing Neuro:  alert, non focal, no tremors Foley with clear urine        Assessment & Plan:

## 2019-11-13 NOTE — Assessment & Plan Note (Signed)
Cause of bilateral infiltrates was never identified but this is gradually improved to the point where she does not need oxygen except during exertion Has come off prednisone Making progress with physical therapy

## 2019-11-13 NOTE — Patient Instructions (Signed)
  Get back on your CPAP machine In 3 to 4 weeks, please bring [place] the SD card on the machine so that we can review report and then try to get you a new machine  You can stay off oxygen while at rest, continue to use it during exercise

## 2019-11-14 NOTE — Telephone Encounter (Signed)
lmtcb for [name].

## 2019-11-15 ENCOUNTER — Encounter: Payer: Self-pay | Admitting: Internal Medicine

## 2019-11-15 NOTE — Progress Notes (Signed)
New orders faxed to facility. 

## 2019-11-15 NOTE — Progress Notes (Signed)
Received records from review that patient's NovoLog is not covered anymore.  I would suggest to change to Humalog, if covered.  Also, her sugars have been high: Breakfast: 186, 232 After breakfast: 198 Lunch 273-296 Dinner 196, 229 Bedtime 203-276  Per my records, she is on the following regimen: -Lantus 26units at bedtime -Novolog 4 units before breakfast 4units before lunch 5 units before dinner  NoNovolog at bedtime.  We can continue the same dose of Lantus for now but change Novolog to  5 units before breakfast 5units before lunch 6 units before dinner

## 2019-11-16 ENCOUNTER — Inpatient Hospital Stay: Payer: Medicare Other

## 2019-11-16 ENCOUNTER — Other Ambulatory Visit: Payer: Self-pay

## 2019-11-16 ENCOUNTER — Inpatient Hospital Stay: Payer: Medicare Other | Admitting: Family

## 2019-11-16 MED ORDER — INSULIN LISPRO (1 UNIT DIAL) 100 UNIT/ML (KWIKPEN): PEN_INJECTOR | SUBCUTANEOUS | 11 refills | Status: DC

## 2019-11-16 NOTE — Telephone Encounter (Signed)
lmtcb for [name].

## 2019-11-17 NOTE — Telephone Encounter (Signed)
I called Chanda but she was not available. LM for her to return call.

## 2019-11-20 ENCOUNTER — Ambulatory Visit: Payer: Medicare Other | Admitting: Physician Assistant

## 2019-11-21 ENCOUNTER — Other Ambulatory Visit: Payer: Medicare Other

## 2019-11-21 ENCOUNTER — Ambulatory Visit: Payer: Medicare Other | Admitting: Family

## 2019-11-21 ENCOUNTER — Ambulatory Visit: Payer: Medicare Other

## 2019-11-21 NOTE — Telephone Encounter (Signed)
We have attempted to contact [name] several times with no success or call back from her. Per triage protocol, message will be closed.

## 2019-11-22 ENCOUNTER — Other Ambulatory Visit: Payer: Self-pay

## 2019-11-22 ENCOUNTER — Inpatient Hospital Stay: Payer: Medicare Other | Attending: Hematology & Oncology

## 2019-11-22 ENCOUNTER — Encounter: Payer: Self-pay | Admitting: Hematology & Oncology

## 2019-11-22 ENCOUNTER — Inpatient Hospital Stay: Payer: Medicare Other

## 2019-11-22 ENCOUNTER — Inpatient Hospital Stay (HOSPITAL_BASED_OUTPATIENT_CLINIC_OR_DEPARTMENT_OTHER): Payer: Medicare Other | Admitting: Hematology & Oncology

## 2019-11-22 VITALS — BP 145/41 | HR 73 | Temp 96.9°F | Resp 20 | Wt 215.0 lb

## 2019-11-22 DIAGNOSIS — D693 Immune thrombocytopenic purpura: Secondary | ICD-10-CM | POA: Diagnosis present

## 2019-11-22 DIAGNOSIS — E119 Type 2 diabetes mellitus without complications: Secondary | ICD-10-CM | POA: Diagnosis not present

## 2019-11-22 DIAGNOSIS — Z794 Long term (current) use of insulin: Secondary | ICD-10-CM | POA: Diagnosis not present

## 2019-11-22 DIAGNOSIS — Z79899 Other long term (current) drug therapy: Secondary | ICD-10-CM | POA: Diagnosis not present

## 2019-11-22 DIAGNOSIS — Z86711 Personal history of pulmonary embolism: Secondary | ICD-10-CM | POA: Diagnosis not present

## 2019-11-22 DIAGNOSIS — I4891 Unspecified atrial fibrillation: Secondary | ICD-10-CM | POA: Diagnosis not present

## 2019-11-22 DIAGNOSIS — D509 Iron deficiency anemia, unspecified: Secondary | ICD-10-CM | POA: Insufficient documentation

## 2019-11-22 DIAGNOSIS — I890 Lymphedema, not elsewhere classified: Secondary | ICD-10-CM | POA: Diagnosis not present

## 2019-11-22 DIAGNOSIS — D500 Iron deficiency anemia secondary to blood loss (chronic): Secondary | ICD-10-CM

## 2019-11-22 DIAGNOSIS — N184 Chronic kidney disease, stage 4 (severe): Secondary | ICD-10-CM

## 2019-11-22 DIAGNOSIS — Z7901 Long term (current) use of anticoagulants: Secondary | ICD-10-CM | POA: Diagnosis not present

## 2019-11-22 LAB — CMP (CANCER CENTER ONLY)
ALT: 10 U/L (ref 0–44)
AST: 20 U/L (ref 15–41)
Albumin: 3.6 g/dL (ref 3.5–5.0)
Alkaline Phosphatase: 83 U/L (ref 38–126)
Anion gap: 8 (ref 5–15)
BUN: 27 mg/dL — ABNORMAL HIGH (ref 8–23)
CO2: 32 mmol/L (ref 22–32)
Calcium: 9.2 mg/dL (ref 8.9–10.3)
Chloride: 100 mmol/L (ref 98–111)
Creatinine: 1.57 mg/dL — ABNORMAL HIGH (ref 0.44–1.00)
GFR, Est AFR Am: 35 mL/min — ABNORMAL LOW (ref >60)
GFR, Estimated: 30 mL/min — ABNORMAL LOW (ref >60)
Glucose, Bld: 295 mg/dL — ABNORMAL HIGH (ref 70–99)
Potassium: 4.1 mmol/L (ref 3.5–5.1)
Sodium: 140 mmol/L (ref 135–145)
Total Bilirubin: 1.2 mg/dL (ref 0.3–1.2)
Total Protein: 6.8 g/dL (ref 6.5–8.1)

## 2019-11-22 LAB — CBC WITH DIFFERENTIAL (CANCER CENTER ONLY)
Abs Immature Granulocytes: 0.02 10*3/uL (ref 0.00–0.07)
Basophils Absolute: 0 10*3/uL (ref 0.0–0.1)
Basophils Relative: 0 %
Eosinophils Absolute: 0.2 10*3/uL (ref 0.0–0.5)
Eosinophils Relative: 3 %
HCT: 32.9 % — ABNORMAL LOW (ref 36.0–46.0)
Hemoglobin: 10.3 g/dL — ABNORMAL LOW (ref 12.0–15.0)
Immature Granulocytes: 0 %
Lymphocytes Relative: 16 %
Lymphs Abs: 0.9 10*3/uL (ref 0.7–4.0)
MCH: 29 pg (ref 26.0–34.0)
MCHC: 31.3 g/dL (ref 30.0–36.0)
MCV: 92.7 fL (ref 80.0–100.0)
Monocytes Absolute: 0.4 10*3/uL (ref 0.1–1.0)
Monocytes Relative: 7 %
Neutro Abs: 4.1 10*3/uL (ref 1.7–7.7)
Neutrophils Relative %: 74 %
Platelet Count: 166 10*3/uL (ref 150–400)
RBC: 3.55 MIL/uL — ABNORMAL LOW (ref 3.87–5.11)
RDW: 16.4 % — ABNORMAL HIGH (ref 11.5–15.5)
WBC Count: 5.7 10*3/uL (ref 4.0–10.5)
nRBC: 0 % (ref 0.0–0.2)

## 2019-11-22 NOTE — Progress Notes (Signed)
Hematology and Oncology Follow Up Visit  [name] [id number] August 01, 1935 85 y.o. 11/22/2019   Principle Diagnosis:  Immune based thrombocytopenia History of PE Iron deficiency anemia Atrial fib  Current Therapy:   Nplate q 3 week for platelet count < 100K IV Iron as indicated Eliquis 2.5 mg po BID   Interim History:  [name] is here today for follow-up.  She actually looks pretty good.  She is still at [facility].  Hopefully, she will go home soon.  Her platelet count today is 166,000.  This is amazing.  She has not had Nplate for probably a month or so.  She still is dealing with her diabetes.  Her blood sugar today is 295.  Because of this, her renal function is deteriorating slowly.  Her creatinine is 1.57.  She has had no bleeding.  She is on low-dose Eliquis.  She has had no fever.  She has had no cough.  Whenever pulmonary issues she had in the hospital seem to be getting better.  She has had no headache.  She broke her right ankle.  She has a walking boot on the right lower leg.  She is not sure when this will come off.  She has chronic lymphedema in her legs.  She has a wrap on her left lower leg.  Overall, I would say performance status is probably ECOG 2-3.     Medications:  Allergies as of 11/22/2019      Reactions   Adhesive [tape] Other (See Comments)   PULLS OFF THE SKIN   Apixaban Other (See Comments)   Internal Bleeding   Aspirin Itching, Rash, Hives, Swelling   Swelling of her tongue   Mirabegron Other (See Comments)   Patient experienced A-Fib   Pineapple Anaphylaxis, Swelling   Throat swells and blisters on tongue and roof of mouth per patient   Metformin Diarrhea, Nausea Only   Tetracycline Hives   Fluticasone-salmeterol Itching, Rash   Iodinated Diagnostic Agents Itching, Rash      Lactose Intolerance (gi) Other (See Comments)   Gas   Latex Itching, Rash, Other (See Comments)   Pulls off the skin and causes welts   Lisinopril Cough   Metronidazole Other (See Comments)   Reaction not known   Sulfa Antibiotics Rash   Sulfonamide Derivatives Itching, Rash      Medication List       Accurate as of November 22, 2019  3:50 PM. If you have any questions, ask your nurse or doctor.        STOP taking these medications   docusate sodium 100 MG capsule Commonly known as: COLACE Stopped by: Volanda Napoleon, MD     TAKE these medications   acetaminophen 325 MG tablet Commonly known as: TYLENOL Take 650 mg by mouth every 6 (six) hours as needed for moderate pain or fever (for fever or pain/discomfort).   amLODipine 2.5 MG tablet Commonly known as: NORVASC Take 1 tablet (2.5 mg total) by mouth daily.   atorvastatin 40 MG tablet Commonly known as: LIPITOR Take 40 mg by mouth at bedtime.   bisacodyl 5 MG EC tablet Commonly known as: DULCOLAX Take 10 mg by mouth every 3 (three) days as needed (for constipation).   feeding supplement (PRO-STAT SUGAR FREE 64) Liqd Take 30 mLs by mouth 2 (two) times daily.   fenofibrate 54 MG tablet TAKE 1 TABLET(54 MG) BY MOUTH DAILY What changed: See the new instructions.   furosemide 20 MG tablet  Commonly known as: LASIX Take 1 tablet (20 mg total) by mouth daily. Take 20 mg by mouth in the morning on Sun/Tues/Thurs/Sat 40mg  on Mon/Wed/Friday   HYDROcodone-acetaminophen 5-325 MG tablet Commonly known as: NORCO/VICODIN Take 1 tablet by mouth every 6 (six) hours as needed for moderate pain.   insulin glargine 100 UNIT/ML injection Commonly known as: LANTUS Inject 0.22 mLs (22 Units total) into the skin at bedtime. What changed: how much to take   insulin lispro 100 UNIT/ML KwikPen Commonly known as: HumaLOG KwikPen Inject 5 units before breakfast and lunch, inject 6 units before dinner.   levothyroxine 112 MCG tablet Commonly known as: SYNTHROID TAKE 1 TABLET BY MOUTH DAILY What changed: when to take this   multivitamin tablet Take 1 tablet by mouth  daily.   omeprazole 20 MG capsule Commonly known as: PRILOSEC Take 20 mg by mouth daily.   potassium citrate 10 MEQ (1080 MG) SR tablet Commonly known as: UROCIT-K Take 2 tablets (20 mEq total) by mouth daily.   sertraline 25 MG tablet Commonly known as: ZOLOFT Take 50 mg by mouth every evening.   vitamin C 250 MG tablet Commonly known as: ASCORBIC ACID Take 250 mg by mouth daily.   Vitamin D3 50 MCG (2000 UT) Tabs Take 2,000 Units by mouth daily.       Allergies:  Allergies  Allergen Reactions  . Adhesive [Tape] Other (See Comments)    PULLS OFF THE SKIN  . Apixaban Other (See Comments)    Internal Bleeding  . Aspirin Itching, Rash, Hives and Swelling    Swelling of her tongue  . Mirabegron Other (See Comments)    Patient experienced A-Fib  . Pineapple Anaphylaxis and Swelling    Throat swells and blisters on tongue and roof of mouth per patient  . Metformin Diarrhea and Nausea Only  . Tetracycline Hives  . Fluticasone-Salmeterol Itching and Rash  . Iodinated Diagnostic Agents Itching and Rash       . Lactose Intolerance (Gi) Other (See Comments)    Gas   . Latex Itching, Rash and Other (See Comments)    Pulls off the skin and causes welts  . Lisinopril Cough  . Metronidazole Other (See Comments)    Reaction not known  . Sulfa Antibiotics Rash  . Sulfonamide Derivatives Itching and Rash    Past Medical History, Surgical history, Social history, and Family History were reviewed and updated.  Review of Systems: Review of Systems  Constitutional: Negative.   HENT: Negative.   Eyes: Negative.   Respiratory: Positive for cough.   Cardiovascular: Positive for palpitations.  Gastrointestinal: Negative.   Genitourinary: Negative.   Musculoskeletal: Positive for joint pain.  Skin: Negative.   Neurological: Negative.   Endo/Heme/Allergies: Bruises/bleeds easily.  Psychiatric/Behavioral: Negative.      Physical Exam:  weight is 215 lb (97.5 kg). Her  temporal temperature is 96.9 F (36.1 C) (abnormal). Her blood pressure is 145/41 (abnormal) and her pulse is 73. Her respiration is 20 and oxygen saturation is 98%.   Wt Readings from Last 3 Encounters:  11/22/19 215 lb (97.5 kg)  11/13/19 213 lb (96.6 kg)  11/02/19 213 lb (96.6 kg)    Physical Exam Vitals reviewed.  HENT:     Head: Normocephalic and atraumatic.  Eyes:     Pupils: Pupils are equal, round, and reactive to light.  Cardiovascular:     Rate and Rhythm: Normal rate and regular rhythm.     Heart sounds: Normal heart sounds.  Comments: Cardiac exam shows an irregular rate and irregular rhythm consistent with atrial fibrillation.  The rate is well controlled. Pulmonary:     Effort: Pulmonary effort is normal.     Breath sounds: Normal breath sounds.     Comments: Lungs show some decent breath sounds bilaterally.  She has occasional wheezes. Abdominal:     General: Bowel sounds are normal.     Palpations: Abdomen is soft.     Comments: She is morbidly obese.  She has decent bowel sounds.  There is no guarding or rebound tenderness.  Musculoskeletal:        General: No tenderness or deformity. Normal range of motion.     Cervical back: Normal range of motion.     Comments: She has a walking boot on the right lower leg.  She has a wrap on the left lower leg.  Lymphadenopathy:     Cervical: No cervical adenopathy.  Skin:    General: Skin is warm and dry.     Findings: No erythema or rash.  Neurological:     Mental Status: She is alert and oriented to person, place, and time.  Psychiatric:        Behavior: Behavior normal.        Thought Content: Thought content normal.        Judgment: Judgment normal.      Lab Results  Component Value Date   WBC 5.7 11/22/2019   HGB 10.3 (L) 11/22/2019   HCT 32.9 (L) 11/22/2019   MCV 92.7 11/22/2019   PLT 166 11/22/2019   Lab Results  Component Value Date   FERRITIN 216 10/20/2019   IRON 48 10/20/2019   TIBC 260  10/20/2019   UIBC 212 10/20/2019   IRONPCTSAT 18 (L) 10/20/2019   Lab Results  Component Value Date   RETICCTPCT 2.8 07/05/2019   RBC 3.55 (L) 11/22/2019   RETICCTABS 73.3 01/28/2011   No results found for: KPAFRELGTCHN, LAMBDASER, KAPLAMBRATIO No results found for: IGGSERUM, IGA, IGMSERUM No results found for: Odetta Pink, SPEI   Chemistry      Component Value Date/Time   NA 140 11/22/2019 1433   NA 145 09/08/2017 1317   NA 142 02/25/2017 1013   K 4.1 11/22/2019 1433   K 4.5 09/08/2017 1317   K 3.8 02/25/2017 1013   CL 100 11/22/2019 1433   CL 98 09/08/2017 1317   CO2 32 11/22/2019 1433   CO2 32 09/08/2017 1317   CO2 26 02/25/2017 1013   BUN 27 (H) 11/22/2019 1433   BUN 34 (H) 09/08/2017 1317   BUN 20.1 02/25/2017 1013   CREATININE 1.57 (H) 11/22/2019 1433   CREATININE 1.9 (H) 09/08/2017 1317   CREATININE 1.8 (H) 02/25/2017 1013      Component Value Date/Time   CALCIUM 9.2 11/22/2019 1433   CALCIUM 9.3 09/08/2017 1317   CALCIUM 8.7 02/25/2017 1013   ALKPHOS 83 11/22/2019 1433   ALKPHOS 45 09/08/2017 1317   ALKPHOS 65 02/25/2017 1013   AST 20 11/22/2019 1433   AST 16 02/25/2017 1013   ALT 10 11/22/2019 1433   ALT 26 09/08/2017 1317   ALT 13 02/25/2017 1013   BILITOT 1.2 11/22/2019 1433   BILITOT 0.76 02/25/2017 1013       Impression and Plan: [name] is a very pleasant 85 yo caucasian female with immune based thrombocytopenia.  We will hold her Nplate today.  Hopefully, she will continue to maintain her  platelet count.  We will get her back in 3 weeks.  I think this would be reasonable.  I know she is at risk for bleeding.  This tends to happen without provocation.  I am just happy that her quality of life seems to be improving.   Volanda Napoleon, MD 3/17/20213:50 PM

## 2019-11-23 LAB — IRON AND TIBC
Iron: 49 ug/dL (ref 41–142)
Saturation Ratios: 20 % — ABNORMAL LOW (ref 21–57)
TIBC: 248 ug/dL (ref 236–444)
UIBC: 199 ug/dL (ref 120–384)

## 2019-11-23 LAB — FERRITIN: Ferritin: 554 ng/mL — ABNORMAL HIGH (ref 11–307)

## 2019-11-27 ENCOUNTER — Other Ambulatory Visit: Payer: Self-pay | Admitting: Family

## 2019-11-27 ENCOUNTER — Telehealth: Payer: Self-pay | Admitting: Family

## 2019-11-27 ENCOUNTER — Other Ambulatory Visit: Payer: Self-pay

## 2019-11-27 ENCOUNTER — Ambulatory Visit (INDEPENDENT_AMBULATORY_CARE_PROVIDER_SITE_OTHER): Payer: Medicare Other | Admitting: Orthopaedic Surgery

## 2019-11-27 ENCOUNTER — Encounter: Payer: Self-pay | Admitting: Orthopaedic Surgery

## 2019-11-27 ENCOUNTER — Ambulatory Visit (INDEPENDENT_AMBULATORY_CARE_PROVIDER_SITE_OTHER): Payer: Medicare Other

## 2019-11-27 DIAGNOSIS — M25571 Pain in right ankle and joints of right foot: Secondary | ICD-10-CM | POA: Diagnosis not present

## 2019-11-27 NOTE — Progress Notes (Signed)
  Patient is a very pleasant 85 year old female who has been dealing with right ankle pain now for several months.  She was concerned that x-rays at her nursing facility showed a "chip of the bone".  I have x-rays from today as well as x-rays from February of this year to review.  Her pain is minimal and she points lateral aspect of her ankle source of her pain.  She has cam walking boot on both feet per our suggestion to have just on one side before her balance.  She is very deconditioned and is significantly overweight with a BMI of 40.  Her daughter is with her in the room today.  She is concerned that they are going to limit her therapy at the skilled nursing facility.  On examination of the right ankle she does have flatfoot deformity.  There is pain over the lateral malleolus only.  Her range of motion is full and she has excellent strength.  The ankle is ligamentously stable.  There is no medial pain.  3 views of her right ankle today are reviewed.  I see no obvious fracture, dislocation or malalignment.  X-rays from February also showed no obvious fracture.  I gave the family reassurance that this is something where she certainly can have pain but there is nothing that we would treat from a surgical perspective or limit her weightbearing or mobility.  I think at this point she should stop the boots on both feet and transition back to regular shoes with an ASO for her right ankle.  This will help with her balance and coordination more.  I have recommended more therapy for her for mobility and strengthening at the skilled nursing facility.  When her daughter finally discharges her from there we can recommend outpatient therapy.  All question concerns were answered and addressed.  We will see her back in about 6 weeks to see how she is doing overall.

## 2019-11-27 NOTE — Telephone Encounter (Signed)
Called and spoke with patient regarding infusion appt added per 3/22 sch msg.  She will check with her transportation to assure they will be able to bring her.  She will call back and advise if there are any issues

## 2019-11-28 ENCOUNTER — Telehealth: Payer: Self-pay | Admitting: *Deleted

## 2019-11-28 ENCOUNTER — Ambulatory Visit: Payer: Medicare Other

## 2019-11-28 NOTE — Telephone Encounter (Signed)
Call received from patient to review her upcoming appointments with her.  Appointments reviewed with patient.  Pt appreciative of assistance and has no further questions at this time.

## 2019-11-30 ENCOUNTER — Ambulatory Visit: Payer: Medicare Other | Admitting: Family

## 2019-11-30 ENCOUNTER — Ambulatory Visit: Payer: Medicare Other

## 2019-11-30 ENCOUNTER — Other Ambulatory Visit: Payer: Medicare Other

## 2019-12-01 ENCOUNTER — Inpatient Hospital Stay: Payer: Medicare Other

## 2019-12-01 ENCOUNTER — Other Ambulatory Visit: Payer: Self-pay

## 2019-12-01 VITALS — BP 140/49 | HR 66 | Temp 96.6°F | Resp 18

## 2019-12-01 DIAGNOSIS — D509 Iron deficiency anemia, unspecified: Secondary | ICD-10-CM | POA: Diagnosis not present

## 2019-12-01 DIAGNOSIS — D693 Immune thrombocytopenic purpura: Secondary | ICD-10-CM

## 2019-12-01 MED ORDER — SODIUM CHLORIDE 0.9 % IV SOLN
510.0000 mg | Freq: Once | INTRAVENOUS | Status: AC
  Administered 2019-12-01: 510 mg via INTRAVENOUS
  Filled 2019-12-01: qty 510

## 2019-12-01 NOTE — Patient Instructions (Signed)

## 2019-12-07 ENCOUNTER — Ambulatory Visit: Payer: Medicare Other

## 2019-12-07 ENCOUNTER — Ambulatory Visit: Payer: Self-pay | Admitting: Internal Medicine

## 2019-12-07 ENCOUNTER — Other Ambulatory Visit: Payer: Medicare Other

## 2019-12-07 ENCOUNTER — Ambulatory Visit: Payer: Medicare Other | Admitting: Hematology & Oncology

## 2019-12-07 NOTE — Progress Notes (Signed)
Received CBGs from [facility] facility with standard report) -Blood sugars between 240 and 359.  No particular patterns. She is on the following regimen: Per my records, she is on the following regimen: -Lantus 26units at bedtime -Novolog 5 units before breakfast 5units before lunch 6 units before dinner  Do not giveNovolog at bedtime.  We can increase Lantus to 30 units and the Novolog to  7 units before breakfast 7units before lunch 8 units before dinner   We will need to review the records again in 1 to 2 weeks.

## 2019-12-11 ENCOUNTER — Telehealth: Payer: Self-pay | Admitting: Internal Medicine

## 2019-12-11 NOTE — Telephone Encounter (Signed)
Message faxed.

## 2019-12-11 NOTE — Progress Notes (Signed)
New orders have been faxed.

## 2019-12-11 NOTE — Telephone Encounter (Signed)
[name] from [facility] is on the phone stating she got a fax from Dr Cruzita Lederer and she needs a clear understanding of the medication for the patient. Ph# [phone]  Dr Darnell Level sent order for Novolog but the patient is on Humalog.

## 2019-12-11 NOTE — Telephone Encounter (Signed)
We can use NovoLog or Humalog interchangeably.

## 2019-12-13 ENCOUNTER — Ambulatory Visit: Payer: Medicare Other

## 2019-12-13 ENCOUNTER — Other Ambulatory Visit: Payer: Medicare Other

## 2019-12-13 ENCOUNTER — Ambulatory Visit: Payer: Medicare Other | Admitting: Family

## 2019-12-15 ENCOUNTER — Inpatient Hospital Stay: Payer: Medicare Other | Attending: Hematology & Oncology

## 2019-12-15 ENCOUNTER — Inpatient Hospital Stay (HOSPITAL_BASED_OUTPATIENT_CLINIC_OR_DEPARTMENT_OTHER): Payer: Medicare Other | Admitting: Family

## 2019-12-15 ENCOUNTER — Encounter: Payer: Self-pay | Admitting: Family

## 2019-12-15 ENCOUNTER — Other Ambulatory Visit: Payer: Self-pay

## 2019-12-15 ENCOUNTER — Inpatient Hospital Stay: Payer: Medicare Other

## 2019-12-15 ENCOUNTER — Telehealth: Payer: Self-pay | Admitting: Hematology & Oncology

## 2019-12-15 VITALS — BP 137/49 | HR 65 | Temp 96.8°F | Resp 18 | Ht 61.0 in | Wt 210.0 lb

## 2019-12-15 DIAGNOSIS — Z79899 Other long term (current) drug therapy: Secondary | ICD-10-CM | POA: Insufficient documentation

## 2019-12-15 DIAGNOSIS — D693 Immune thrombocytopenic purpura: Secondary | ICD-10-CM

## 2019-12-15 DIAGNOSIS — I4891 Unspecified atrial fibrillation: Secondary | ICD-10-CM | POA: Insufficient documentation

## 2019-12-15 DIAGNOSIS — Z7901 Long term (current) use of anticoagulants: Secondary | ICD-10-CM | POA: Diagnosis not present

## 2019-12-15 DIAGNOSIS — D500 Iron deficiency anemia secondary to blood loss (chronic): Secondary | ICD-10-CM

## 2019-12-15 DIAGNOSIS — Z794 Long term (current) use of insulin: Secondary | ICD-10-CM | POA: Diagnosis not present

## 2019-12-15 DIAGNOSIS — Z86711 Personal history of pulmonary embolism: Secondary | ICD-10-CM | POA: Insufficient documentation

## 2019-12-15 DIAGNOSIS — D509 Iron deficiency anemia, unspecified: Secondary | ICD-10-CM | POA: Diagnosis present

## 2019-12-15 DIAGNOSIS — D696 Thrombocytopenia, unspecified: Secondary | ICD-10-CM | POA: Diagnosis not present

## 2019-12-15 LAB — CBC WITH DIFFERENTIAL (CANCER CENTER ONLY)
Abs Immature Granulocytes: 0.01 10*3/uL (ref 0.00–0.07)
Basophils Absolute: 0 10*3/uL (ref 0.0–0.1)
Basophils Relative: 1 %
Eosinophils Absolute: 0.2 10*3/uL (ref 0.0–0.5)
Eosinophils Relative: 5 %
HCT: 31.7 % — ABNORMAL LOW (ref 36.0–46.0)
Hemoglobin: 10.5 g/dL — ABNORMAL LOW (ref 12.0–15.0)
Immature Granulocytes: 0 %
Lymphocytes Relative: 17 %
Lymphs Abs: 0.7 10*3/uL (ref 0.7–4.0)
MCH: 30.3 pg (ref 26.0–34.0)
MCHC: 33.1 g/dL (ref 30.0–36.0)
MCV: 91.6 fL (ref 80.0–100.0)
Monocytes Absolute: 0.3 10*3/uL (ref 0.1–1.0)
Monocytes Relative: 6 %
Neutro Abs: 3 10*3/uL (ref 1.7–7.7)
Neutrophils Relative %: 71 %
Platelet Count: 134 10*3/uL — ABNORMAL LOW (ref 150–400)
RBC: 3.46 MIL/uL — ABNORMAL LOW (ref 3.87–5.11)
RDW: 17.7 % — ABNORMAL HIGH (ref 11.5–15.5)
WBC Count: 4.2 10*3/uL (ref 4.0–10.5)
nRBC: 0 % (ref 0.0–0.2)

## 2019-12-15 LAB — CMP (CANCER CENTER ONLY)
ALT: 11 U/L (ref 0–44)
AST: 16 U/L (ref 15–41)
Albumin: 3.9 g/dL (ref 3.5–5.0)
Alkaline Phosphatase: 40 U/L (ref 38–126)
Anion gap: 8 (ref 5–15)
BUN: 48 mg/dL — ABNORMAL HIGH (ref 8–23)
CO2: 31 mmol/L (ref 22–32)
Calcium: 9.4 mg/dL (ref 8.9–10.3)
Chloride: 100 mmol/L (ref 98–111)
Creatinine: 1.75 mg/dL — ABNORMAL HIGH (ref 0.44–1.00)
GFR, Est AFR Am: 31 mL/min — ABNORMAL LOW (ref >60)
GFR, Estimated: 27 mL/min — ABNORMAL LOW (ref >60)
Glucose, Bld: 304 mg/dL — ABNORMAL HIGH (ref 70–99)
Potassium: 3.8 mmol/L (ref 3.5–5.1)
Sodium: 139 mmol/L (ref 135–145)
Total Bilirubin: 1 mg/dL (ref 0.3–1.2)
Total Protein: 6.7 g/dL (ref 6.5–8.1)

## 2019-12-15 NOTE — Progress Notes (Signed)
Hematology and Oncology Follow Up Visit  [name] [id number] Jan 18, 1935 85 y.o. 12/15/2019   Principle Diagnosis:  Immune based thrombocytopenia History of PE Iron deficiency anemia Atrial fib  Current Therapy:        Nplate q 3 week for platelet count < 100K IV Iron as indicated Eliquis 2.5 mg po BID   Interim History:  [name] is here today with her daughter for follow-up. She is doing well and is excited to be going home tomorrow.  She states that she is now walking again and feels that her energy os improving.  She has not noted any obvious blood loss. No bruising or petechiae.  Hgb is stable at 10.5, MCV 91, platelet count 134.  No fever, chills, n/v, cough, rash, dizziness, SOB, chest pain, palpitations, abdominal pain or changes in bowel or bladder habits.  The swelling in her lower extremities is tremendously improved. Pedal pulses 2+ She is wearing a brace in the right foot.  No falls or syncopal episodes to report.  She states that her appetite is very good and she is staying properly hydrated. Her weight is stable.   ECOG Performance Status: 1 - Symptomatic but completely ambulatory  Medications:  Allergies as of 12/15/2019      Reactions   Adhesive [tape] Other (See Comments)   PULLS OFF THE SKIN   Apixaban Other (See Comments)   Internal Bleeding   Aspirin Itching, Rash, Hives, Swelling   Swelling of her tongue   Mirabegron Other (See Comments)   Patient experienced A-Fib   Pineapple Anaphylaxis, Swelling   Throat swells and blisters on tongue and roof of mouth per patient   Metformin Diarrhea, Nausea Only   Tetracycline Hives   Fluticasone-salmeterol Itching, Rash   Iodinated Diagnostic Agents Itching, Rash      Lactose Intolerance (gi) Other (See Comments)   Gas   Latex Itching, Rash, Other (See Comments)   Pulls off the skin and causes welts   Lisinopril Cough   Metronidazole Other (See Comments)   Reaction not known   Sulfa  Antibiotics Rash   Sulfonamide Derivatives Itching, Rash      Medication List       Accurate as of December 15, 2019 12:09 PM. If you have any questions, ask your nurse or doctor.        acetaminophen 325 MG tablet Commonly known as: TYLENOL Take 650 mg by mouth every 6 (six) hours as needed for moderate pain or fever (for fever or pain/discomfort).   amLODipine 2.5 MG tablet Commonly known as: NORVASC Take 1 tablet (2.5 mg total) by mouth daily.   atorvastatin 40 MG tablet Commonly known as: LIPITOR Take 40 mg by mouth at bedtime.   bisacodyl 5 MG EC tablet Commonly known as: DULCOLAX Take 10 mg by mouth every 3 (three) days as needed (for constipation).   feeding supplement (PRO-STAT SUGAR FREE 64) Liqd Take 30 mLs by mouth 2 (two) times daily.   fenofibrate 54 MG tablet TAKE 1 TABLET(54 MG) BY MOUTH DAILY What changed: See the new instructions.   furosemide 20 MG tablet Commonly known as: LASIX Take 1 tablet (20 mg total) by mouth daily. Take 20 mg by mouth in the morning on Sun/Tues/Thurs/Sat 40mg  on Mon/Wed/Friday   HYDROcodone-acetaminophen 5-325 MG tablet Commonly known as: NORCO/VICODIN Take 1 tablet by mouth every 6 (six) hours as needed for moderate pain.   insulin glargine 100 UNIT/ML injection Commonly known as: LANTUS Inject 0.22 mLs (22  Units total) into the skin at bedtime. What changed: how much to take   insulin lispro 100 UNIT/ML KwikPen Commonly known as: HumaLOG KwikPen Inject 5 units before breakfast and lunch, inject 6 units before dinner.   levothyroxine 112 MCG tablet Commonly known as: SYNTHROID TAKE 1 TABLET BY MOUTH DAILY What changed: when to take this   multivitamin tablet Take 1 tablet by mouth daily.   omeprazole 20 MG capsule Commonly known as: PRILOSEC Take 20 mg by mouth daily.   potassium citrate 10 MEQ (1080 MG) SR tablet Commonly known as: UROCIT-K Take 2 tablets (20 mEq total) by mouth daily.   sertraline 25 MG  tablet Commonly known as: ZOLOFT Take 50 mg by mouth every evening.   vitamin C 250 MG tablet Commonly known as: ASCORBIC ACID Take 250 mg by mouth daily.   Vitamin D3 50 MCG (2000 UT) Tabs Take 2,000 Units by mouth daily.       Allergies:  Allergies  Allergen Reactions  . Adhesive [Tape] Other (See Comments)    PULLS OFF THE SKIN  . Apixaban Other (See Comments)    Internal Bleeding  . Aspirin Itching, Rash, Hives and Swelling    Swelling of her tongue  . Mirabegron Other (See Comments)    Patient experienced A-Fib  . Pineapple Anaphylaxis and Swelling    Throat swells and blisters on tongue and roof of mouth per patient  . Metformin Diarrhea and Nausea Only  . Tetracycline Hives  . Fluticasone-Salmeterol Itching and Rash  . Iodinated Diagnostic Agents Itching and Rash       . Lactose Intolerance (Gi) Other (See Comments)    Gas   . Latex Itching, Rash and Other (See Comments)    Pulls off the skin and causes welts  . Lisinopril Cough  . Metronidazole Other (See Comments)    Reaction not known  . Sulfa Antibiotics Rash  . Sulfonamide Derivatives Itching and Rash    Past Medical History, Surgical history, Social history, and Family History were reviewed and updated.  Review of Systems: All other 10 point review of systems is negative.   Physical Exam:  height is 5\' 1"  (1.549 m) and weight is 210 lb (95.3 kg). Her temporal temperature is 96.8 F (36 C) (abnormal). Her blood pressure is 137/49 (abnormal) and her pulse is 65. Her respiration is 18 and oxygen saturation is 100%.   Wt Readings from Last 3 Encounters:  12/15/19 210 lb (95.3 kg)  11/22/19 215 lb (97.5 kg)  11/13/19 213 lb (96.6 kg)    Ocular: Sclerae unicteric, pupils equal, round and reactive to light Ear-nose-throat: Oropharynx clear, dentition fair Lymphatic: No cervical or supraclavicular adenopathy Lungs no rales or rhonchi, good excursion bilaterally Heart irregular rate and rhythm  (controlled), no murmur appreciated Abd soft, nontender, positive bowel sounds, no liver or spleen tip palpated on exam, no fluid wave  MSK no focal spinal tenderness, no joint edema Neuro: non-focal, well-oriented, appropriate affect Breasts: Deferred   Lab Results  Component Value Date   WBC 4.2 12/15/2019   HGB 10.5 (L) 12/15/2019   HCT 31.7 (L) 12/15/2019   MCV 91.6 12/15/2019   PLT 134 (L) 12/15/2019   Lab Results  Component Value Date   FERRITIN 554 (H) 11/22/2019   IRON 49 11/22/2019   TIBC 248 11/22/2019   UIBC 199 11/22/2019   IRONPCTSAT 20 (L) 11/22/2019   Lab Results  Component Value Date   RETICCTPCT 2.8 07/05/2019   RBC 3.46 (  L) 12/15/2019   RETICCTABS 73.3 01/28/2011   No results found for: KPAFRELGTCHN, LAMBDASER, KAPLAMBRATIO No results found for: Kandis Cocking, IGMSERUM No results found for: Odetta Pink, SPEI   Chemistry      Component Value Date/Time   NA 139 12/15/2019 1121   NA 145 09/08/2017 1317   NA 142 02/25/2017 1013   K 3.8 12/15/2019 1121   K 4.5 09/08/2017 1317   K 3.8 02/25/2017 1013   CL 100 12/15/2019 1121   CL 98 09/08/2017 1317   CO2 31 12/15/2019 1121   CO2 32 09/08/2017 1317   CO2 26 02/25/2017 1013   BUN 48 (H) 12/15/2019 1121   BUN 34 (H) 09/08/2017 1317   BUN 20.1 02/25/2017 1013   CREATININE 1.75 (H) 12/15/2019 1121   CREATININE 1.9 (H) 09/08/2017 1317   CREATININE 1.8 (H) 02/25/2017 1013      Component Value Date/Time   CALCIUM 9.4 12/15/2019 1121   CALCIUM 9.3 09/08/2017 1317   CALCIUM 8.7 02/25/2017 1013   ALKPHOS 40 12/15/2019 1121   ALKPHOS 45 09/08/2017 1317   ALKPHOS 65 02/25/2017 1013   AST 16 12/15/2019 1121   AST 16 02/25/2017 1013   ALT 11 12/15/2019 1121   ALT 26 09/08/2017 1317   ALT 13 02/25/2017 1013   BILITOT 1.0 12/15/2019 1121   BILITOT 0.76 02/25/2017 1013       Impression and Plan: Ms. Hogland is a very pleasant 85yo caucasian female  with immune based thrombocytopenia. No Nplate needed today, platelets 134.  We will see what her iron studies look like and replace if needed.  We will plan to see her again in another 3 weeks.  They will contact our office with any questions or concerns. We can certainly see her sooner if needed.   Laverna Peace, NP 4/9/202112:09 PM

## 2019-12-15 NOTE — Telephone Encounter (Signed)
Appointments scheduled calendar printed per 4/9 los

## 2019-12-18 ENCOUNTER — Telehealth: Payer: Self-pay | Admitting: Internal Medicine

## 2019-12-18 LAB — IRON AND TIBC
Iron: 70 ug/dL (ref 41–142)
Saturation Ratios: 23 % (ref 21–57)
TIBC: 303 ug/dL (ref 236–444)
UIBC: 233 ug/dL (ref 120–384)

## 2019-12-18 LAB — LACTATE DEHYDROGENASE: LDH: 142 U/L (ref 98–192)

## 2019-12-18 LAB — FERRITIN: Ferritin: 696 ng/mL — ABNORMAL HIGH (ref 11–307)

## 2019-12-18 NOTE — Telephone Encounter (Signed)
Patient is no longer in assisted living - she lives with her daughter now, and her daughter is calling in regards to patient having high blood sugar readings. Patient ph# [phone] she would like Nurse or Dr to give her a call back as soon as possible to discuss.

## 2019-12-19 NOTE — Telephone Encounter (Signed)
Spoke with [name] regarding med doses:  Humalog 7 units breakfast, lunch 8 units dinner  Lantus 30 units dinner

## 2019-12-19 NOTE — Telephone Encounter (Signed)
We can increase the Lantus slightly to 34 units after dinner and also the Humalog to 10 units with each meal.  Please asked him to let me know in few days how the sugars are doing.

## 2019-12-20 NOTE — Telephone Encounter (Signed)
Notified patient daughter of message from Dr. Cruzita Lederer, she expressed understanding and agreement. No further questions.  Daughter will call Friday to give [place] her sugar readings.

## 2019-12-22 ENCOUNTER — Other Ambulatory Visit: Payer: Self-pay

## 2019-12-22 ENCOUNTER — Encounter: Payer: Self-pay | Admitting: Internal Medicine

## 2019-12-22 ENCOUNTER — Telehealth: Payer: Self-pay

## 2019-12-22 MED ORDER — INSULIN GLARGINE 100 UNIT/ML ~~LOC~~ SOLN: 38.0000 [IU] | Freq: Every day | SUBCUTANEOUS | 2 refills | Status: DC

## 2019-12-22 MED ORDER — INSULIN LISPRO (1 UNIT DIAL) 100 UNIT/ML (KWIKPEN): 12.0000 [IU] | PEN_INJECTOR | Freq: Three times a day (TID) | SUBCUTANEOUS | 2 refills | Status: DC

## 2019-12-22 NOTE — Telephone Encounter (Signed)
Contact patient's daughter [name] to give her new insulin doses based on CGM readings:  Increase Lantus to 38 units and Humalog to 12 units before meals.  [name] expressed understanding and agreement.

## 2019-12-22 NOTE — Progress Notes (Signed)
Reviewed the new CGM downloads- Sugars improved in the last 3 days, after increasing Lantus and Humalog. However, today remain above target.  I will advised them to increase the insulin further: -Lantus to 38 units daily -Humalog to 12 units before meals

## 2019-12-29 ENCOUNTER — Telehealth: Payer: Self-pay

## 2019-12-29 NOTE — Telephone Encounter (Signed)
Per Dr. Cruzita Lederer increase Lantus to 44 units and the Humalog to 15 units.  Notified patient daughter.  Patient also needs refill on [place] sensor and needles. I need to call Monday to see if she needs pen needle and syringe.

## 2019-12-29 NOTE — Telephone Encounter (Signed)
Updated data from [facility] printed and placed on Dr. Cruzita Lederer desk for review/advise.  Patient daughter feels her mother is doing better.

## 2020-01-02 MED ORDER — "INSULIN SYRINGE 29G X 1/2"" 0.3 ML MISC": 3 refills | Status: AC

## 2020-01-02 MED ORDER — FREESTYLE LIBRE 14 DAY SENSOR MISC: 3 refills | Status: DC

## 2020-01-02 NOTE — Telephone Encounter (Signed)
Patient has vials.  RX's sent.

## 2020-01-04 ENCOUNTER — Inpatient Hospital Stay: Payer: Medicare Other

## 2020-01-04 ENCOUNTER — Encounter: Payer: Self-pay | Admitting: Family

## 2020-01-04 ENCOUNTER — Inpatient Hospital Stay (HOSPITAL_BASED_OUTPATIENT_CLINIC_OR_DEPARTMENT_OTHER): Payer: Medicare Other | Admitting: Family

## 2020-01-04 ENCOUNTER — Other Ambulatory Visit: Payer: Self-pay

## 2020-01-04 ENCOUNTER — Telehealth: Payer: Self-pay | Admitting: Hematology & Oncology

## 2020-01-04 VITALS — BP 143/53 | HR 66 | Temp 97.7°F | Resp 18 | Ht 61.0 in | Wt 208.5 lb

## 2020-01-04 DIAGNOSIS — D696 Thrombocytopenia, unspecified: Secondary | ICD-10-CM

## 2020-01-04 DIAGNOSIS — D500 Iron deficiency anemia secondary to blood loss (chronic): Secondary | ICD-10-CM

## 2020-01-04 DIAGNOSIS — D693 Immune thrombocytopenic purpura: Secondary | ICD-10-CM | POA: Diagnosis not present

## 2020-01-04 LAB — CMP (CANCER CENTER ONLY)
ALT: 17 U/L (ref 0–44)
AST: 25 U/L (ref 15–41)
Albumin: 4.1 g/dL (ref 3.5–5.0)
Alkaline Phosphatase: 67 U/L (ref 38–126)
Anion gap: 10 (ref 5–15)
BUN: 44 mg/dL — ABNORMAL HIGH (ref 8–23)
CO2: 30 mmol/L (ref 22–32)
Calcium: 9.5 mg/dL (ref 8.9–10.3)
Chloride: 103 mmol/L (ref 98–111)
Creatinine: 1.6 mg/dL — ABNORMAL HIGH (ref 0.44–1.00)
GFR, Est AFR Am: 34 mL/min — ABNORMAL LOW (ref >60)
GFR, Estimated: 30 mL/min — ABNORMAL LOW (ref >60)
Glucose, Bld: 196 mg/dL — ABNORMAL HIGH (ref 70–99)
Potassium: 4.2 mmol/L (ref 3.5–5.1)
Sodium: 143 mmol/L (ref 135–145)
Total Bilirubin: 0.9 mg/dL (ref 0.3–1.2)
Total Protein: 7.1 g/dL (ref 6.5–8.1)

## 2020-01-04 LAB — CBC WITH DIFFERENTIAL (CANCER CENTER ONLY)
Abs Immature Granulocytes: 0.01 10*3/uL (ref 0.00–0.07)
Basophils Absolute: 0 10*3/uL (ref 0.0–0.1)
Basophils Relative: 0 %
Eosinophils Absolute: 0.2 10*3/uL (ref 0.0–0.5)
Eosinophils Relative: 4 %
HCT: 32.9 % — ABNORMAL LOW (ref 36.0–46.0)
Hemoglobin: 11 g/dL — ABNORMAL LOW (ref 12.0–15.0)
Immature Granulocytes: 0 %
Lymphocytes Relative: 22 %
Lymphs Abs: 0.9 10*3/uL (ref 0.7–4.0)
MCH: 31.2 pg (ref 26.0–34.0)
MCHC: 33.4 g/dL (ref 30.0–36.0)
MCV: 93.2 fL (ref 80.0–100.0)
Monocytes Absolute: 0.3 10*3/uL (ref 0.1–1.0)
Monocytes Relative: 8 %
Neutro Abs: 2.6 10*3/uL (ref 1.7–7.7)
Neutrophils Relative %: 66 %
Platelet Count: 119 10*3/uL — ABNORMAL LOW (ref 150–400)
RBC: 3.53 MIL/uL — ABNORMAL LOW (ref 3.87–5.11)
RDW: 16.6 % — ABNORMAL HIGH (ref 11.5–15.5)
WBC Count: 3.9 10*3/uL — ABNORMAL LOW (ref 4.0–10.5)
nRBC: 0 % (ref 0.0–0.2)

## 2020-01-04 NOTE — Progress Notes (Addendum)
Hematology and Oncology Follow Up Visit  [name] [id number] [birth year] 85 y.o. 01/04/2020   Principle Diagnosis:  Immune based thrombocytopenia History of PE Iron deficiency anemia Atrial fib  Current Therapy: Nplate q3week for platelet count < 100K IV Iron as indicated Eliquis 2.5 mg po BID   Interim History:  [name] is here today with her daughter for follow-up. She is doing well and was able to walk 400 feet yesterday.  She has good energy and is enjoying PT twice a week.  No episodes of bleeding. Normal bruising, no petechiae.  No fever, chills, n/v, cough, rash, dizziness, SOB, chest pain, palpitations, abdominal pain or changes in bowel or bladder habits.   The swelling in her legs continues to improve. She has a little redness/hyperpigmentation in the lower legs.  No tenderness in her extremities.  She has numbness and tingling in her fingers that waxes and wanes.  She has maintained a good appetite and is staying well hydrated. Her weight is stable.   ECOG Performance Status: 1 - Symptomatic but completely ambulatory  Medications:  Allergies as of 01/04/2020      Reactions   Adhesive [tape] Other (See Comments)   PULLS OFF THE SKIN   Apixaban Other (See Comments)   Internal Bleeding   Aspirin Itching, Rash, Hives, Swelling   Swelling of her tongue   Mirabegron Other (See Comments)   Patient experienced A-Fib   Pineapple Anaphylaxis, Swelling   Throat swells and blisters on tongue and roof of mouth per patient   Metformin Diarrhea, Nausea Only   Tetracycline Hives   Fluticasone-salmeterol Itching, Rash   Iodinated Diagnostic Agents Itching, Rash      Lactose Intolerance (gi) Other (See Comments)   Gas   Latex Itching, Rash, Other (See Comments)   Pulls off the skin and causes welts   Lisinopril Cough   Metronidazole Other (See Comments)   Reaction not known   Sulfa Antibiotics Rash   Sulfonamide Derivatives Itching, Rash        Medication List       Accurate as of January 04, 2020  1:53 PM. If you have any questions, ask your nurse or doctor.        acetaminophen 325 MG tablet Commonly known as: TYLENOL Take 650 mg by mouth every 6 (six) hours as needed for moderate pain or fever (for fever or pain/discomfort).   amLODipine 2.5 MG tablet Commonly known as: NORVASC Take 1 tablet (2.5 mg total) by mouth daily.   atorvastatin 40 MG tablet Commonly known as: LIPITOR Take 40 mg by mouth at bedtime.   bisacodyl 5 MG EC tablet Commonly known as: DULCOLAX Take 10 mg by mouth every 3 (three) days as needed (for constipation).   feeding supplement (PRO-STAT SUGAR FREE 64) Liqd Take 30 mLs by mouth 2 (two) times daily.   fenofibrate 54 MG tablet TAKE 1 TABLET(54 MG) BY MOUTH DAILY What changed: See the new instructions.   FreeStyle Libre 14 Day Sensor Misc Apply new sensor every 14 days.   furosemide 20 MG tablet Commonly known as: LASIX Take 1 tablet (20 mg total) by mouth daily. Take 20 mg by mouth in the morning on Sun/Tues/Thurs/Sat 40mg  on Mon/Wed/Friday   HYDROcodone-acetaminophen 5-325 MG tablet Commonly known as: NORCO/VICODIN Take 1 tablet by mouth every 6 (six) hours as needed for moderate pain.   insulin glargine 100 UNIT/ML injection Commonly known as: LANTUS Inject 0.38 mLs (38 Units total) into the skin at bedtime.  insulin lispro 100 UNIT/ML KwikPen Commonly known as: HumaLOG KwikPen Inject 0.12 mLs (12 Units total) into the skin with breakfast, with lunch, and with evening meal.   INSULIN SYRINGE .3CC/29GX1/2" 29G X 1/2" 0.3 ML Misc Use to inject insulin 4 times a day.   levothyroxine 112 MCG tablet Commonly known as: SYNTHROID TAKE 1 TABLET BY MOUTH DAILY What changed: when to take this   multivitamin tablet Take 1 tablet by mouth daily.   omeprazole 20 MG capsule Commonly known as: PRILOSEC Take 20 mg by mouth daily.   potassium citrate 10 MEQ (1080 MG) SR  tablet Commonly known as: UROCIT-K Take 2 tablets (20 mEq total) by mouth daily.   sertraline 25 MG tablet Commonly known as: ZOLOFT Take 50 mg by mouth every evening.   vitamin C 250 MG tablet Commonly known as: ASCORBIC ACID Take 250 mg by mouth daily.   Vitamin D3 50 MCG (2000 UT) Tabs Take 2,000 Units by mouth daily.       Allergies:  Allergies  Allergen Reactions  . Adhesive [Tape] Other (See Comments)    PULLS OFF THE SKIN  . Apixaban Other (See Comments)    Internal Bleeding  . Aspirin Itching, Rash, Hives and Swelling    Swelling of her tongue  . Mirabegron Other (See Comments)    Patient experienced A-Fib  . Pineapple Anaphylaxis and Swelling    Throat swells and blisters on tongue and roof of mouth per patient  . Metformin Diarrhea and Nausea Only  . Tetracycline Hives  . Fluticasone-Salmeterol Itching and Rash  . Iodinated Diagnostic Agents Itching and Rash       . Lactose Intolerance (Gi) Other (See Comments)    Gas   . Latex Itching, Rash and Other (See Comments)    Pulls off the skin and causes welts  . Lisinopril Cough  . Metronidazole Other (See Comments)    Reaction not known  . Sulfa Antibiotics Rash  . Sulfonamide Derivatives Itching and Rash    Past Medical History, Surgical history, Social history, and Family History were reviewed and updated.  Review of Systems: All other 10 point review of systems is negative.   Physical Exam:  vitals were not taken for this visit.   Wt Readings from Last 3 Encounters:  12/15/19 210 lb (95.3 kg)  11/22/19 215 lb (97.5 kg)  11/13/19 213 lb (96.6 kg)    Ocular: Sclerae unicteric, pupils equal, round and reactive to light Ear-nose-throat: Oropharynx clear, dentition fair Lymphatic: No cervical or supraclavicular adenopathy Lungs no rales or rhonchi, good excursion bilaterally Heart regular rate and rhythm, no murmur appreciated Abd soft, nontender, positive bowel sounds, no liver or spleen tip  palpated on exam, no fluid wave  MSK no focal spinal tenderness, no joint edema Neuro: non-focal, well-oriented, appropriate affect Breasts: Deferred   Lab Results  Component Value Date   WBC 4.2 12/15/2019   HGB 10.5 (L) 12/15/2019   HCT 31.7 (L) 12/15/2019   MCV 91.6 12/15/2019   PLT 134 (L) 12/15/2019   Lab Results  Component Value Date   FERRITIN 696 (H) 12/15/2019   IRON 70 12/15/2019   TIBC 303 12/15/2019   UIBC 233 12/15/2019   IRONPCTSAT 23 12/15/2019   Lab Results  Component Value Date   RETICCTPCT 2.8 07/05/2019   RBC 3.46 (L) 12/15/2019   RETICCTABS 73.3 01/28/2011   No results found for: KPAFRELGTCHN, LAMBDASER, KAPLAMBRATIO No results found for: IGGSERUM, IGA, IGMSERUM No results found for: TOTALPROTELP,  Ronney Lion, SPEI   Chemistry      Component Value Date/Time   NA 139 12/15/2019 1121   NA 145 09/08/2017 1317   NA 142 02/25/2017 1013   K 3.8 12/15/2019 1121   K 4.5 09/08/2017 1317   K 3.8 02/25/2017 1013   CL 100 12/15/2019 1121   CL 98 09/08/2017 1317   CO2 31 12/15/2019 1121   CO2 32 09/08/2017 1317   CO2 26 02/25/2017 1013   BUN 48 (H) 12/15/2019 1121   BUN 34 (H) 09/08/2017 1317   BUN 20.1 02/25/2017 1013   CREATININE 1.75 (H) 12/15/2019 1121   CREATININE 1.9 (H) 09/08/2017 1317   CREATININE 1.8 (H) 02/25/2017 1013      Component Value Date/Time   CALCIUM 9.4 12/15/2019 1121   CALCIUM 9.3 09/08/2017 1317   CALCIUM 8.7 02/25/2017 1013   ALKPHOS 40 12/15/2019 1121   ALKPHOS 45 09/08/2017 1317   ALKPHOS 65 02/25/2017 1013   AST 16 12/15/2019 1121   AST 16 02/25/2017 1013   ALT 11 12/15/2019 1121   ALT 26 09/08/2017 1317   ALT 13 02/25/2017 1013   BILITOT 1.0 12/15/2019 1121   BILITOT 0.76 02/25/2017 1013       Impression and Plan: [name] is a very pleasant 85yo caucasian female with immune based thrombocytopenia. No Nplate needed today, platelets 119.  We will see what her iron  studies look like and replace if needed.  We will plan to see her again in another 3 weeks for lab and injection only and follow-up in 6 weeks.  They will contact our office with any questions or concerns. We can certainly see her sooner if needed.   Laverna Peace, NP 4/29/20211:53 PM

## 2020-01-04 NOTE — Telephone Encounter (Signed)
Appointments scheduled calendar declined per 4/29 los

## 2020-01-05 LAB — IRON AND TIBC
Iron: 75 ug/dL (ref 41–142)
Saturation Ratios: 27 % (ref 21–57)
TIBC: 276 ug/dL (ref 236–444)
UIBC: 201 ug/dL (ref 120–384)

## 2020-01-05 LAB — FERRITIN: Ferritin: 470 ng/mL — ABNORMAL HIGH (ref 11–307)

## 2020-01-05 LAB — LACTATE DEHYDROGENASE: LDH: 170 U/L (ref 98–192)

## 2020-01-08 ENCOUNTER — Ambulatory Visit (INDEPENDENT_AMBULATORY_CARE_PROVIDER_SITE_OTHER): Payer: Medicare Other | Admitting: Orthopaedic Surgery

## 2020-01-08 ENCOUNTER — Encounter: Payer: Self-pay | Admitting: Orthopaedic Surgery

## 2020-01-08 ENCOUNTER — Other Ambulatory Visit: Payer: Self-pay

## 2020-01-08 DIAGNOSIS — M25571 Pain in right ankle and joints of right foot: Secondary | ICD-10-CM

## 2020-01-08 NOTE — Progress Notes (Signed)
The patient comes in today for continued follow-up due to severe deconditioning.  When I saw her last she has been staying in skilled nursing facility and had cam walking boot on both her ankles.  This was due to a supposed avulsion fracture of her right ankle or a "chip off of the bone" that was seen.  My x-rays and those x-rays did not show any fracture.  They had placed her in a second cam walker on her left ankle for balance purposes.  We had both his cam walking boot removed.  She says she is doing better overall and her right ankle is much better.  She says she can weight-bear when needed.  She is still severely deconditioned and mainly in a wheelchair.  She does have home therapy.  On exam she lets me move both ankles easily.  Her right ankle does have a lace up ASO.  From my standpoint follow-up can be as needed.  She will continue home therapy hopefully for another 4 weeks.  If she decides to have outpatient therapy she will let us know.  That can be performed here and we can always order that to continue to work on her generalized upper and lower extremity strengthening with balance and coordination for deconditioning.

## 2020-01-10 ENCOUNTER — Other Ambulatory Visit: Payer: Self-pay

## 2020-01-10 ENCOUNTER — Ambulatory Visit: Payer: Medicare Other | Admitting: Internal Medicine

## 2020-01-12 ENCOUNTER — Other Ambulatory Visit: Payer: Self-pay

## 2020-01-12 ENCOUNTER — Ambulatory Visit (INDEPENDENT_AMBULATORY_CARE_PROVIDER_SITE_OTHER): Payer: Medicare Other | Admitting: Internal Medicine

## 2020-01-12 ENCOUNTER — Encounter: Payer: Self-pay | Admitting: Internal Medicine

## 2020-01-12 VITALS — BP 136/70 | HR 72 | Ht 61.0 in | Wt 214.0 lb

## 2020-01-12 DIAGNOSIS — N184 Chronic kidney disease, stage 4 (severe): Secondary | ICD-10-CM

## 2020-01-12 DIAGNOSIS — E1122 Type 2 diabetes mellitus with diabetic chronic kidney disease: Secondary | ICD-10-CM

## 2020-01-12 DIAGNOSIS — Z794 Long term (current) use of insulin: Secondary | ICD-10-CM

## 2020-01-12 DIAGNOSIS — E890 Postprocedural hypothyroidism: Secondary | ICD-10-CM

## 2020-01-12 LAB — POCT GLYCOSYLATED HEMOGLOBIN (HGB A1C): Hemoglobin A1C: 7.1 % — AB (ref 4.0–5.6)

## 2020-01-12 MED ORDER — INSULIN PEN NEEDLE 32G X 4 MM MISC: 3 refills | Status: DC

## 2020-01-12 MED ORDER — LANTUS SOLOSTAR 100 UNIT/ML ~~LOC~~ SOPN
50.0000 [IU] | PEN_INJECTOR | Freq: Every day | SUBCUTANEOUS | 3 refills | Status: DC
Start: 2020-01-12 — End: 2020-10-28

## 2020-01-12 MED ORDER — INSULIN LISPRO (1 UNIT DIAL) 100 UNIT/ML (KWIKPEN): 13.0000 [IU] | PEN_INJECTOR | Freq: Three times a day (TID) | SUBCUTANEOUS | 3 refills | Status: DC

## 2020-01-12 NOTE — Progress Notes (Signed)
Patient ID: [name], female   DOB: Jun 12, 1935, 85 y.o.   MRN: [id number]   This visit occurred during the SARS-CoV-2 public health emergency.  Safety protocols were in place, including screening questions prior to the visit, additional usage of staff PPE, and extensive cleaning of exam room while observing appropriate contact time as indicated for disinfecting solutions.    HPI: Sara BARCELLOS is a 85 y.o.-year-old female, returning for f/u for DM2, dx in late 1990s, insulin-dependent since 2012, uncontrolled, with complications (CKD stage 4, CHF, + DR) and hypothyroidism. She prev. Saw Dr. Chalmers Cater.  Last visit with me 3 months ago (virtual).  She is accompanied by her daughter who offers almost the entire medical information as patient is nonverbal.  Patient is on palliative care now.  She is now home from [facility] 1 mo ago.  DM2:  Reviewed HbA1c levels: Lab Results  Component Value Date   HGBA1C 7.3 (H) 07/19/2019   HGBA1C 6.3 (A) 02/22/2019   HGBA1C 7.3 (A) 09/08/2018  12/11/2018: HbA1c 6.9% 08/19/2017: HbA1c calculated from fructosamine is 6.7%  She is on (regimen changed 12/29/2019):   - Lantus 44 units   - Humalog 15 units 3x a day before meals  She checks her sugars more than 4 times a day with her freestyle libre CGM:  Freestyle libre CGM parameters: - Average: 152 - % active CGM time: 91%of the time - Glucose variability 23.3 (target < or = to 36%) - time in range:  - very low (<54): 0% - low (54-69): 3% - normal range (70-180): 76% - high sugars (181-250): 21% - very high sugars (>250): 0%    Prev:  Lowest sugar was 295 >> ...77 >> 88 >> 50s >> 98 (Libre); she has hypoglycemia awareness at 100. Highest sugar was 559 >> .Marland Kitchen.203 >> 258 >> 400 (steroids) >> 224.  Glucometer: True Metrix air >> Freestyle Libre CGM  Pt's meals are: - Breakfast: grits, bacon, green tea + stevia >> eggs + bacon + toast/grits + fruit - Lunch: 1/2 sandwich + salad + chips  and drink >> 1/2 sandwich - snack: fruit - Dinner:meat + veggies + occas. Starch >> meat + veggies - Snacks: 2   -+ Stage IV CKD, last BUN/creatinine:  Lab Results  Component Value Date   BUN 44 (H) 01/04/2020   BUN 48 (H) 12/15/2019   CREATININE 1.60 (H) 01/04/2020   CREATININE 1.75 (H) 12/15/2019  03/15/2018: 47/2.34, GFR 19, Glu 283  Reviewed GFR levels: Lab Results  Component Value Date   GFRNONAA 30 (L) 01/04/2020   GFRNONAA 27 (L) 12/15/2019   GFRNONAA 30 (L) 11/22/2019   GFRNONAA 35 (L) 11/02/2019   GFRNONAA 36 (L) 10/20/2019   GFRNONAA 39 (L) 10/02/2019   GFRNONAA 29 (L) 09/28/2019   GFRNONAA 27 (L) 08/07/2019   GFRNONAA 28 (L) 08/06/2019   GFRNONAA 23 (L) 08/04/2019  He has a h/o uric acid kidney stones.  -+ HL; last set of lipids: Lab Results  Component Value Date   CHOL 131 05/19/2018   HDL 26.70 (L) 05/19/2018   LDLCALC 65 08/16/2009   LDLDIRECT 41.0 05/19/2018   TRIG (H) 05/19/2018    656.0 Triglyceride is over 400; calculations on Lipids are invalid.   CHOLHDL 5 05/19/2018  On atorvastatin 40, fenofibrate 45, . - last eye exam was in 05/2018: + DR. Coming up in June. -+ Numbness and tingling in her toes  She has a h/o urinary incontinence >> started Myrbetriq >>  developed A fib >> started Eliquis >> had bleeding >> received blood transfusion.  Postsurgical (1960s) and postablative (1977 and 1999) hypothyroidism   Pt is on levothyroxine 112 mcg daily, taken: - in am - fasting - at least 30 min from b'fast - no Ca, Fe, MVI - + Omeprazole 1h later! - not on Biotin  Her TFTs have been normal: Lab Results  Component Value Date   TSH 0.355 07/20/2019   TSH 0.838 07/04/2019   TSH 1.64 02/22/2019   TSH 0.92 05/19/2018   TSH 1.82 12/15/2016   TSH 0.87 09/11/2015   TSH 0.715 08/25/2015   TSH 2.19 06/11/2015   TSH 1.46 09/25/2011   TSH 1.82 06/10/2011   She had a mild MI at the beginning of 2019 and she also has CHF.   She also has immune  thrombocytopenia and anemia.  ROS: Constitutional: no weight gain/+ weight loss, no fatigue, no subjective hyperthermia, no subjective hypothermia Eyes: no blurry vision, no xerophthalmia ENT: no sore throat, no nodules palpated in neck, no dysphagia, no odynophagia, no hoarseness Cardiovascular: no CP/no SOB/no palpitations/no leg swelling Respiratory: no cough/no SOB/no wheezing Gastrointestinal: no N/no V/no D/no C/no acid reflux Musculoskeletal: no muscle aches/no joint aches Skin: no rashes, no hair loss Neurological: no tremors/+ numbness/+ tingling/no dizziness  I reviewed pt's medications, allergies, PMH, social hx, family hx, and changes were documented in the history of present illness. Otherwise, unchanged from my initial visit note.  Past Medical History:  Diagnosis Date  . Allergic rhinitis   . Anxiety state, unspecified    panic attacks  . CHF (congestive heart failure) ([place])   . Depressive disorder, not elsewhere classified   . Extrinsic asthma, unspecified    no problem since adulthood  . Obesity   . OSA on CPAP    severe  . Pure hypercholesterolemia   . Respiratory failure with hypoxia ([place]) 09/2008   acute, secondary to multiple bilateral pulmonary embolism , negative hypercoagulable workup 09/2008 hospital stay  . Scoliosis   . Type II or unspecified type diabetes mellitus without mention of complication, not stated as uncontrolled   . Unspecified essential hypertension   . Unspecified hypothyroidism    hypo   Past Surgical History:  Procedure Laterality Date  . CARDIOVERSION N/A 06/26/2019   Procedure: CARDIOVERSION;  Surgeon: Jerline Pain, MD;  Location: [facility] ENDOSCOPY;  Service: Cardiovascular;  Laterality: N/A;  . EYE SURGERY    . JOINT REPLACEMENT    . knee replaced    . THYROID SURGERY     Social History   Social History  . Marital status: Widowed    Spouse name: N/A  . Number of children: 2   Occupational History  . 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 Smoking status: Former Smoker    Years: 20.00    Quit date: 09/07/1989  . Smokeless tobacco: Never Used  . Alcohol use No  . Drug use: No   Current Outpatient Medications on File Prior to Visit  Medication Sig Dispense Refill  . acetaminophen (TYLENOL) 325 MG tablet Take 650 mg by mouth every 6 (six) hours as needed for moderate pain or fever (for fever or pain/discomfort).     . Amino Acids-Protein Hydrolys (FEEDING SUPPLEMENT, PRO-STAT SUGAR FREE 64,) LIQD Take 30 mLs by mouth 2 (two) times daily. 887 mL 0  . amLODipine (NORVASC) 2.5 MG tablet Take 1 tablet (2.5 mg total)  by mouth daily.    Marland Kitchen atorvastatin (LIPITOR) 40 MG tablet Take 40 mg by mouth at bedtime.     . bisacodyl (DULCOLAX) 5 MG EC tablet Take 10 mg by mouth every 3 (three) days as needed (for constipation).     . Cholecalciferol (VITAMIN D3) 2000 units TABS Take 2,000 Units by mouth daily.    . Continuous Blood Gluc Sensor (FREESTYLE LIBRE 14 DAY SENSOR) MISC Apply new sensor every 14 days. 6 each 3  . fenofibrate 54 MG tablet TAKE 1 TABLET(54 MG) BY MOUTH DAILY (Patient taking differently: Take 54 mg by mouth daily. ) 90 tablet 1  . fexofenadine (ALLEGRA) 180 MG tablet Take 180 mg by mouth daily.    . furosemide (LASIX) 20 MG tablet Take 1 tablet (20 mg total) by mouth daily. Take 20 mg by mouth in the morning on Sun/Tues/Thurs/Sat 40mg  on Mon/Wed/Friday 30 tablet   . HYDROcodone-acetaminophen (NORCO/VICODIN) 5-325 MG tablet Take 1 tablet by mouth every 6 (six) hours as needed for moderate pain. 10 tablet 0  . insulin glargine (LANTUS) 100 UNIT/ML injection Inject 0.38 mLs (38 Units total) into the skin at bedtime. 34.2 mL 2  . insulin lispro (HUMALOG KWIKPEN) 100 UNIT/ML KwikPen Inject 0.12 mLs (12 Units total) into the skin with breakfast, with lunch, and with evening meal. 32.4 mL 2  . Insulin Syringe-Needle U-100 (INSULIN SYRINGE .3CC/29GX1/2") 29G X 1/2"  0.3 ML MISC Use to inject insulin 4 times a day. 400 each 3  . levothyroxine (SYNTHROID, LEVOTHROID) 112 MCG tablet TAKE 1 TABLET BY MOUTH DAILY (Patient taking differently: Take 112 mcg by mouth daily before breakfast. ) 90 tablet 0  . Multiple Vitamin (MULTIVITAMIN) tablet Take 1 tablet by mouth daily.      Marland Kitchen omeprazole (PRILOSEC) 20 MG capsule Take 20 mg by mouth daily.     . potassium citrate (UROCIT-K) 10 MEQ (1080 MG) SR tablet Take 2 tablets (20 mEq total) by mouth daily.    . sertraline (ZOLOFT) 25 MG tablet Take 50 mg by mouth every evening.   4  . vitamin C (ASCORBIC ACID) 250 MG tablet Take 250 mg by mouth daily.     No current facility-administered medications on file prior to visit.   Allergies  Allergen Reactions  . Adhesive [Tape] Other (See Comments)    PULLS OFF THE SKIN  . Apixaban Other (See Comments)    Internal Bleeding  . Aspirin Itching, Rash, Hives and Swelling    Swelling of her tongue  . Mirabegron Other (See Comments)    Patient experienced A-Fib  . Pineapple Anaphylaxis and Swelling    Throat swells and blisters on tongue and roof of mouth per patient  . Metformin Diarrhea and Nausea Only  . Tetracycline Hives  . Fluticasone-Salmeterol Itching and Rash  . Iodinated Diagnostic Agents Itching and Rash       . Lactose Intolerance (Gi) Other (See Comments)    Gas   . Latex Itching, Rash and Other (See Comments)    Pulls off the skin and causes welts  . Lisinopril Cough  . Metronidazole Other (See Comments)    Reaction not known  . Sulfa Antibiotics Rash  . Sulfonamide Derivatives Itching and Rash   Family History  Problem Relation Age of Onset  . Allergies Mother   . Clotting disorder Mother   . Osteoarthritis Mother   . Asthma Mother   . Arthritis Other   . Diabetes Other   . Hyperlipidemia Other   .  Hypertension Other   . Coronary artery disease Other   . Stroke Other   . Osteoarthritis Daughter   . Rheum arthritis Maternal Grandmother    . Clotting disorder Maternal Grandmother   . Clotting disorder Maternal Uncle   . Clotting disorder Daughter   . Allergies Daughter   . Breast cancer Neg Hx    PE: BP 136/70   Pulse 72   Ht 5\' 1"  (1.549 m)   Wt 214 lb (97.1 kg)   LMP  (LMP Unknown)   SpO2 94%   BMI 40.43 kg/m  Wt Readings from Last 3 Encounters:  01/12/20 214 lb (97.1 kg)  01/04/20 208 lb 8 oz (94.6 kg)  12/15/19 210 lb (95.3 kg)   Constitutional: overweight, in NAD Eyes: PERRLA, EOMI, no exophthalmos ENT: moist mucous membranes, no thyromegaly, no cervical lymphadenopathy Cardiovascular: RRR, No MRG Respiratory: CTA B Gastrointestinal: abdomen soft, NT, ND, BS+ Musculoskeletal: no deformities, strength intact in all 4 Skin: moist, warm, no rashes Neurological: no tremor with outstretched hands, DTR normal in all 4  ASSESSMENT: 1. DM2, insulin-dependent, uncontrolled, with complications - CKD stage 4 - CHF - + DR  2. Hypothyroidism  3. Hyperlipidemia  PLAN:  1. Patient with longstanding, uncontrolled, type 2 diabetes, insulin-dependent, previously on premixed insulin and now on a basal back on his insulin regimen.  In the past she was also on a GLP-1 receptor agonist but she was residing in [facility] for a period of time and the facility stopped her GLP-1 receptor agonist as she was not eating well.  Indeed, since our last in person visit approximately a year ago, she lost 40 pounds.  For now, we will continue off GLP-1 receptor agonist. -Our last visit was virtual.  At that time, she did not have her CGM available at the facility was giving it away from her.  I advised the facility to allow the patient to hold onto the receiver.  We also changed her insulin doses since last visit, now using higher doses.  Of note, she got out of the facility approximately a month ago.  She tells me that she got her appetite back.  We had to increase her insulin doses, currently on fixed doses of Lantus and Humalog.  We  discussed that the Humalog doses will need to be more adjustable.  Discussed with patient, her aide, and her daughter about how to adjust the doses of Humalog.  We will also increase the Lantus slightly to drop her blood sugars to goal, but they do appear to have improved since last visit. - I suggested to:  Patient Instructions  Please increase:   - Lantus to 46-48 units   - Humalog to 13-17 units 3x a day before meals  Please continue levothyroxine 112 mcg daily  Take the thyroid hormone every day, with water, at least 30 minutes before breakfast, separated by at least 4 hours from: - acid reflux medications - calcium - iron - multivitamins  Please move Omeprazole to lunchtime or later.  Please return in 3-4 months with your freestyle libre CGM.   - we checked her HbA1c: 7.1% (lower) - advised to check sugars at different times of the day - 4x a day, rotating check times - advised for yearly eye exams >> she is UTD - return to clinic in 3-4 months  2. HL -Reviewed latest lipid panel from 05/2018: LDL at goal, triglycerides high, HDL low: Lab Results  Component Value Date   CHOL 131  05/19/2018   HDL 26.70 (L) 05/19/2018   LDLCALC 65 08/16/2009   LDLDIRECT 41.0 05/19/2018   TRIG (H) 05/19/2018    656.0 Triglyceride is over 400; calculations on Lipids are invalid.   CHOLHDL 5 05/19/2018  -She continues statin, fenofibrate, without side effects -She is due for another lipid panel -will have appointment with new PCP soon  3.  Hypothyroidism  - latest thyroid labs reviewed with pt >> normal: Lab Results  Component Value Date   TSH 0.355 07/20/2019   - she continues on LT4 112 mcg daily - pt feels good on this dose. - we discussed about taking the thyroid hormone every day, with water, >30 minutes before breakfast, separated by >4 hours from acid reflux medications, calcium, iron, multivitamins. Pt. Is not taking it correctly: Omeprazole is only taken 1 hour after breakfast,  rather than at least 4 hours later.  I advised her to move omeprazole before lunch. - will check thyroid tests today: TSH and fT4 at next visit     Philemon Kingdom, MD PhD [facility] Endocrinology

## 2020-01-12 NOTE — Addendum Note (Signed)
Addended by: Cardell Peach I on: 01/12/2020 02:25 PM   Modules accepted: Orders

## 2020-01-12 NOTE — Patient Instructions (Addendum)
Please increase:   - Lantus to 46-48 units   - Humalog to 13-17 units 3x a day before meals  Please continue levothyroxine 112 mcg daily  Take the thyroid hormone every day, with water, at least 30 minutes before breakfast, separated by at least 4 hours from: - acid reflux medications - calcium - iron - multivitamins  Please move Omeprazole to lunchtime or later.  Please return in 3-4 months with your freestyle libre CGM.

## 2020-01-19 ENCOUNTER — Telehealth: Payer: Self-pay

## 2020-01-19 ENCOUNTER — Ambulatory Visit (INDEPENDENT_AMBULATORY_CARE_PROVIDER_SITE_OTHER): Payer: Medicare Other | Admitting: Cardiology

## 2020-01-19 ENCOUNTER — Other Ambulatory Visit: Payer: Self-pay

## 2020-01-19 ENCOUNTER — Encounter: Payer: Self-pay | Admitting: Cardiology

## 2020-01-19 VITALS — BP 130/40 | HR 75 | Ht 61.0 in | Wt 215.0 lb

## 2020-01-19 DIAGNOSIS — D696 Thrombocytopenia, unspecified: Secondary | ICD-10-CM

## 2020-01-19 DIAGNOSIS — I480 Paroxysmal atrial fibrillation: Secondary | ICD-10-CM

## 2020-01-19 NOTE — Patient Instructions (Signed)
Medication Instructions:   STOP TAKING AMLODIPINE NOW  *If you need a refill on your cardiac medications before your next appointment, please call your pharmacy*   Follow-Up: At [facility], you and your health needs are our priority.  As part of our continuing mission to provide you with exceptional heart care, we have created designated Provider Care Teams.  These Care Teams include your primary Cardiologist (physician) and Advanced Practice Providers (APPs -  Physician Assistants and Nurse Practitioners) who all work together to provide you with the care you need, when you need it.  We recommend signing up for the patient portal called "MyChart".  Sign up information is provided on this After Visit Summary.  MyChart is used to connect with patients for Virtual Visits (Telemedicine).  Patients are able to view lab/test results, encounter notes, upcoming appointments, etc.  Non-urgent messages can be sent to your provider as well.   To learn more about what you can do with MyChart, go to [email or web address].    Your next appointment:   12 month(s)  The format for your next appointment:   In Person  Provider:   [name], MD

## 2020-01-19 NOTE — Progress Notes (Signed)
Cardiology Office Note:    Date:  01/19/2020   ID:  [name], DOB [birth year], MRN [id number]  PCP:  Patient, No Pcp Per  Cardiologist:  [name], MD  Electrophysiologist:  None   Referring MD: Javier Glazier, MD     History of Present Illness:    [name] is a 85 y.o. female with atrial fibrillation, Eliquis, thrombocytopenia, followed by hematology.  In 2019 noted to be in SVT either flutter versus atrial tachycardia.  Fatigue.  Diltiazem was increased to 180 once a day.  Roderic Palau had been seeing her as well.  She was in the hospital in October 2020 about a week after the cardioversion.  Had multifocal pneumonia.  Was there for several days.  They were thankful that she had the cardioversion performed.  Overall she is feeling much better.  She has chronic venous stasis that is improved.  Has lost some weight.  Her daughter is with her today.  Past Medical History:  Diagnosis Date  . Allergic rhinitis   . Anxiety state, unspecified    panic attacks  . CHF (congestive heart failure) ([place])   . Depressive disorder, not elsewhere classified   . Extrinsic asthma, unspecified    no problem since adulthood  . Obesity   . OSA on CPAP    severe  . Pure hypercholesterolemia   . Respiratory failure with hypoxia ([place]) 09/2008   acute, secondary to multiple bilateral pulmonary embolism , negative hypercoagulable workup 09/2008 hospital stay  . Scoliosis   . Type II or unspecified type diabetes mellitus without mention of complication, not stated as uncontrolled   . Unspecified essential hypertension   . Unspecified hypothyroidism    hypo    Past Surgical History:  Procedure Laterality Date  . CARDIOVERSION N/A 06/26/2019   Procedure: CARDIOVERSION;  Surgeon: Jerline Pain, MD;  Location: [facility] ENDOSCOPY;  Service: Cardiovascular;  Laterality: N/A;  . EYE SURGERY    . JOINT REPLACEMENT    . knee replaced    . THYROID SURGERY      Current  Medications: Current Meds  Medication Sig  . acetaminophen (TYLENOL) 325 MG tablet Take 650 mg by mouth every 6 (six) hours as needed for moderate pain or fever (for fever or pain/discomfort).   . Amino Acids-Protein Hydrolys (FEEDING SUPPLEMENT, PRO-STAT SUGAR FREE 64,) LIQD Take 30 mLs by mouth 2 (two) times daily.  Marland Kitchen atorvastatin (LIPITOR) 40 MG tablet Take 40 mg by mouth at bedtime.   . bisacodyl (DULCOLAX) 5 MG EC tablet Take 10 mg by mouth every 3 (three) days as needed (for constipation).   . Cholecalciferol (VITAMIN D3) 2000 units TABS Take 2,000 Units by mouth daily.  . Continuous Blood Gluc Sensor (FREESTYLE LIBRE 14 DAY SENSOR) MISC Apply new sensor every 14 days.  . fenofibrate 54 MG tablet TAKE 1 TABLET(54 MG) BY MOUTH DAILY  . fexofenadine (ALLEGRA) 180 MG tablet Take 180 mg by mouth daily.  . furosemide (LASIX) 20 MG tablet Take 1 tablet (20 mg total) by mouth daily. Take 20 mg by mouth in the morning on Sun/Tues/Thurs/Sat 40mg  on Mon/Wed/Friday  . HYDROcodone-acetaminophen (NORCO/VICODIN) 5-325 MG tablet Take 1 tablet by mouth every 6 (six) hours as needed for moderate pain.  . hyoscyamine (LEVSIN) 0.125 MG tablet Take 2 tablets by mouth every 4 (four) hours as needed.  . insulin glargine (LANTUS SOLOSTAR) 100 UNIT/ML Solostar Pen Inject 50 Units into the skin daily.  . insulin lispro (HUMALOG  KWIKPEN) 100 UNIT/ML KwikPen Inject 0.13-0.2 mLs (13-20 Units total) into the skin with breakfast, with lunch, and with evening meal.  . Insulin Pen Needle 32G X 4 MM MISC Use 4x a day  . Insulin Syringe-Needle U-100 (INSULIN SYRINGE .3CC/29GX1/2") 29G X 1/2" 0.3 ML MISC Use to inject insulin 4 times a day.  . levothyroxine (SYNTHROID, LEVOTHROID) 112 MCG tablet TAKE 1 TABLET BY MOUTH DAILY  . Multiple Vitamin (MULTIVITAMIN) tablet Take 1 tablet by mouth daily.    Marland Kitchen omeprazole (PRILOSEC) 20 MG capsule Take 20 mg by mouth daily.   . potassium citrate (UROCIT-K) 10 MEQ (1080 MG) SR tablet  Take 2 tablets (20 mEq total) by mouth daily.  . sertraline (ZOLOFT) 25 MG tablet Take 50 mg by mouth every evening.   . sertraline (ZOLOFT) 50 MG tablet Take 50 mg by mouth daily.  . vitamin C (ASCORBIC ACID) 250 MG tablet Take 250 mg by mouth daily.  . [DISCONTINUED] amLODipine (NORVASC) 2.5 MG tablet Take 1 tablet (2.5 mg total) by mouth daily.     Allergies:   Adhesive [tape], Apixaban, Aspirin, Mirabegron, Pineapple, Metformin, Tetracycline, Fluticasone-salmeterol, Iodinated diagnostic agents, Lactose intolerance (gi), Latex, Lisinopril, Metronidazole, Sulfa antibiotics, and Sulfonamide derivatives   Social History   Socioeconomic History  . Marital status: Widowed    Spouse name: Not on file  . Number of children: 2  . Years of education: Not on file  . Highest education level: Not on file  Occupational History  . Occupation: OWNER    Employer: [employer]    Comment: Self employed- runs Advertising copywriter  Tobacco Use  . Smoking status: Former Smoker    Packs/day: 0.25    Years: 20.00    Pack years: 5.00    Types: Cigarettes    Quit date: 09/07/1989    Years since quitting: 30.3  . Smokeless tobacco: Never Used  Substance and Sexual Activity  . Alcohol use: No  . Drug use: No  . Sexual activity: Not Currently  Other Topics Concern  . Not on file  Social History Narrative   Lives at [place] Strain:   . Difficulty of Paying Living Expenses:   Food Insecurity:   . Worried About Charity fundraiser in the Last Year:   . Arboriculturist in the Last Year:   Transportation Needs:   . Film/video editor (Medical):   Marland Kitchen Lack of Transportation (Non-Medical):   Physical Activity:   . Days of Exercise per Week:   . Minutes of Exercise per Session:   Stress:   . Feeling of Stress :   Social Connections:   . Frequency of Communication with Friends and Family:   . Frequency of Social Gatherings with Friends and  Family:   . Attends Religious Services:   . Active Member of Clubs or Organizations:   . Attends Archivist Meetings:   Marland Kitchen Marital Status:      Family History: The patient's family history includes Allergies in her daughter and mother; Arthritis in an other family member; Asthma in her mother; Clotting disorder in her daughter, maternal grandmother, maternal uncle, and mother; Coronary artery disease in an other family member; Diabetes in an other family member; Hyperlipidemia in an other family member; Hypertension in an other family member; Osteoarthritis in her daughter and mother; Rheum arthritis in her maternal grandmother; Stroke in an other family member. There is no history  of Breast cancer.  ROS:   Please see the history of present illness.    Breathing better, no chest pain no palpitations all other systems reviewed and are negative.  EKGs/Labs/Other Studies Reviewed:    The following studies were reviewed today: Cardioversion on 06/26/2019-successful  Recent Labs: 07/20/2019: TSH 0.355 07/22/2019: B Natriuretic Peptide 346.4 08/04/2019: Magnesium 2.6 01/04/2020: ALT 17; BUN 44; Creatinine 1.60; Hemoglobin 11.0; Platelet Count 119; Potassium 4.2; Sodium 143  Recent Lipid Panel    Component Value Date/Time   CHOL 131 05/19/2018 1140   TRIG (H) 05/19/2018 1140    656.0 Triglyceride is over 400; calculations on Lipids are invalid.   HDL 26.70 (L) 05/19/2018 1140   CHOLHDL 5 05/19/2018 1140   VLDL 118.2 (H) 09/25/2011 1511   LDLCALC 65 08/16/2009 0902   LDLDIRECT 41.0 05/19/2018 1140    Physical Exam:    VS:  BP (!) 130/40   Pulse 75   Ht 5\' 1"  (1.549 m)   Wt 215 lb (97.5 kg)   LMP  (LMP Unknown)   SpO2 96%   BMI 40.62 kg/m     Wt Readings from Last 3 Encounters:  01/19/20 215 lb (97.5 kg)  01/12/20 214 lb (97.1 kg)  01/04/20 208 lb 8 oz (94.6 kg)     GEN:  Well nourished, well developed in no acute distress HEENT: Normal NECK: No JVD; No carotid  bruits LYMPHATICS: No lymphadenopathy CARDIAC: RRR, no murmurs, rubs, gallops RESPIRATORY:  Clear to auscultation without rales, wheezing or rhonchi  ABDOMEN: Soft, non-tender, non-distended MUSCULOSKELETAL:  No edema; No deformity  SKIN: Warm and dry NEUROLOGIC:  Alert and oriented x 3 PSYCHIATRIC:  Normal affect   ASSESSMENT:    1. Paroxysmal atrial fibrillation (HCC)   2. Thrombocytopenia (HCC)    PLAN:    In order of problems listed above:  Paroxysmal atrial fibrillation/flutter -CHA2DS2-VASc 5, previously determined not to be a candidate for either anticoagulation or ablation.  Continue with rate control.  Doing well post cardioversion.  No palpitations. -Prior hemoglobin 11.6, creatinine 2.7  Chronic kidney disease stage III/IV -Stable.  Avoid NSAIDs.  Essential hypertension -Doing well.  Medications reviewed. Diastolic low. OK to stop amlodipine.   Obstructive sleep apnea -CPAP not using, not snoring.  Excellent  Thrombocytopenia -36 to 340.  She thinks that since she is under less stress at [facility] she is doing better.   Medication Adjustments/Labs and Tests Ordered: Current medicines are reviewed at length with the patient today.  Concerns regarding medicines are outlined above.  No orders of the defined types were placed in this encounter.  No orders of the defined types were placed in this encounter.   Patient Instructions  Medication Instructions:   STOP TAKING AMLODIPINE NOW  *If you need a refill on your cardiac medications before your next appointment, please call your pharmacy*   Follow-Up: At [facility], you and your health needs are our priority.  As part of our continuing mission to provide you with exceptional heart care, we have created designated Provider Care Teams.  These Care Teams include your primary Cardiologist (physician) and Advanced Practice Providers (APPs -  Physician Assistants and Nurse Practitioners) who all work  together to provide you with the care you need, when you need it.  We recommend signing up for the patient portal called "MyChart".  Sign up information is provided on this After Visit Summary.  MyChart is used to connect with patients for Virtual Visits (Telemedicine).  Patients  are able to view lab/test results, encounter notes, upcoming appointments, etc.  Non-urgent messages can be sent to your provider as well.   To learn more about what you can do with MyChart, go to [email or web address].    Your next appointment:   12 month(s)  The format for your next appointment:   In Person  Provider:   [name], MD        Signed, [name], MD  01/19/2020 4:04 PM    [place]

## 2020-01-19 NOTE — Telephone Encounter (Signed)
Sugars appear stable. Let's continue the same insulin doses. Average per last 2 week = 160, with not a lot of variation.

## 2020-01-19 NOTE — Telephone Encounter (Signed)
Patient daughter [name] calling to update [place] on her recent blood sugars and to see if they are improving and if any changes to her medication needs to be made at this time.  CBG printed and given to Dr. Cruzita Lederer for review.

## 2020-01-19 NOTE — Telephone Encounter (Signed)
Notified patient daughter of below message, she expressed understanding and agreement.

## 2020-01-22 ENCOUNTER — Other Ambulatory Visit: Payer: Self-pay

## 2020-01-22 MED ORDER — OMEPRAZOLE 20 MG PO CPDR: 20.0000 mg | DELAYED_RELEASE_CAPSULE | Freq: Every day | ORAL | 6 refills | Status: DC

## 2020-01-22 MED ORDER — FUROSEMIDE 20 MG PO TABS: 20.0000 mg | ORAL_TABLET | Freq: Every day | ORAL | 2 refills | Status: DC

## 2020-01-22 NOTE — Telephone Encounter (Signed)
Spoke with the pts daughter and RX sent in for her lasix... pt has been on Omeprazole 20 mg is OTC and she will pick up at the Pharmacy.

## 2020-01-22 NOTE — Telephone Encounter (Signed)
Pt's daughter calling stating at [place] with Dr. Marlou Porch, he stated that he would send in pt's medication Furosemide 20 mg tablet, pt taking 20 mg tablet on Tuesday, Thursday, Saturday and Sunday and pt takes furosemide 40 mg tablets on Monday, Wednesday and Friday, this was not sent into pt's pharmacy. Daughter also states that Dr. Marlou Porch was sending in a medication for pt's heartburn as well. Pt needs medication today. Please address

## 2020-01-25 ENCOUNTER — Inpatient Hospital Stay: Payer: Medicare Other

## 2020-01-25 ENCOUNTER — Inpatient Hospital Stay: Payer: Medicare Other | Attending: Hematology & Oncology

## 2020-01-25 ENCOUNTER — Other Ambulatory Visit: Payer: Self-pay

## 2020-01-25 DIAGNOSIS — D693 Immune thrombocytopenic purpura: Secondary | ICD-10-CM | POA: Insufficient documentation

## 2020-01-25 LAB — CBC WITH DIFFERENTIAL (CANCER CENTER ONLY)
Abs Immature Granulocytes: 0.01 10*3/uL (ref 0.00–0.07)
Basophils Absolute: 0 10*3/uL (ref 0.0–0.1)
Basophils Relative: 0 %
Eosinophils Absolute: 0.2 10*3/uL (ref 0.0–0.5)
Eosinophils Relative: 3 %
HCT: 34.6 % — ABNORMAL LOW (ref 36.0–46.0)
Hemoglobin: 11.7 g/dL — ABNORMAL LOW (ref 12.0–15.0)
Immature Granulocytes: 0 %
Lymphocytes Relative: 23 %
Lymphs Abs: 1.3 10*3/uL (ref 0.7–4.0)
MCH: 31.9 pg (ref 26.0–34.0)
MCHC: 33.8 g/dL (ref 30.0–36.0)
MCV: 94.3 fL (ref 80.0–100.0)
Monocytes Absolute: 0.4 10*3/uL (ref 0.1–1.0)
Monocytes Relative: 7 %
Neutro Abs: 3.7 10*3/uL (ref 1.7–7.7)
Neutrophils Relative %: 67 %
Platelet Count: 124 10*3/uL — ABNORMAL LOW (ref 150–400)
RBC: 3.67 MIL/uL — ABNORMAL LOW (ref 3.87–5.11)
RDW: 16.2 % — ABNORMAL HIGH (ref 11.5–15.5)
WBC Count: 5.5 10*3/uL (ref 4.0–10.5)
nRBC: 0 % (ref 0.0–0.2)

## 2020-01-25 LAB — CMP (CANCER CENTER ONLY)
ALT: 17 U/L (ref 0–44)
AST: 20 U/L (ref 15–41)
Albumin: 4.3 g/dL (ref 3.5–5.0)
Alkaline Phosphatase: 88 U/L (ref 38–126)
Anion gap: 10 (ref 5–15)
BUN: 54 mg/dL — ABNORMAL HIGH (ref 8–23)
CO2: 32 mmol/L (ref 22–32)
Calcium: 10.2 mg/dL (ref 8.9–10.3)
Chloride: 98 mmol/L (ref 98–111)
Creatinine: 1.64 mg/dL — ABNORMAL HIGH (ref 0.44–1.00)
GFR, Est AFR Am: 33 mL/min — ABNORMAL LOW (ref >60)
GFR, Estimated: 29 mL/min — ABNORMAL LOW (ref >60)
Glucose, Bld: 210 mg/dL — ABNORMAL HIGH (ref 70–99)
Potassium: 4.4 mmol/L (ref 3.5–5.1)
Sodium: 140 mmol/L (ref 135–145)
Total Bilirubin: 1 mg/dL (ref 0.3–1.2)
Total Protein: 7.4 g/dL (ref 6.5–8.1)

## 2020-01-25 NOTE — Patient Instructions (Signed)
Patient has her next appt schedule and is good to go.

## 2020-01-25 NOTE — Progress Notes (Signed)
No N-Plate for [name] today per parameters.  She is 89 and order is to give if below 100K.  She was very pleased. Maggie

## 2020-02-07 ENCOUNTER — Encounter: Payer: Medicare Other | Admitting: Physician Assistant

## 2020-02-12 ENCOUNTER — Other Ambulatory Visit: Payer: Self-pay

## 2020-02-12 ENCOUNTER — Telehealth: Payer: Self-pay | Admitting: Orthopaedic Surgery

## 2020-02-12 DIAGNOSIS — M25571 Pain in right ankle and joints of right foot: Secondary | ICD-10-CM

## 2020-02-12 DIAGNOSIS — S82831D Other fracture of upper and lower end of right fibula, subsequent encounter for closed fracture with routine healing: Secondary | ICD-10-CM

## 2020-02-12 NOTE — Telephone Encounter (Signed)
Sent order.

## 2020-02-12 NOTE — Telephone Encounter (Signed)
Patient daughter called requesting Dr. Ninfa Linden put referral for Physical therapy at [place]. Daughter [name] phone number is [phone]. Please call daughter when referral is submitted.

## 2020-02-14 ENCOUNTER — Ambulatory Visit: Payer: Medicare Other | Admitting: Rehabilitative and Restorative Service Providers"

## 2020-02-19 ENCOUNTER — Inpatient Hospital Stay (HOSPITAL_BASED_OUTPATIENT_CLINIC_OR_DEPARTMENT_OTHER): Payer: Medicare Other | Admitting: Family

## 2020-02-19 ENCOUNTER — Inpatient Hospital Stay: Payer: Medicare Other

## 2020-02-19 ENCOUNTER — Encounter: Payer: Self-pay | Admitting: Family

## 2020-02-19 ENCOUNTER — Telehealth: Payer: Self-pay | Admitting: Family

## 2020-02-19 ENCOUNTER — Inpatient Hospital Stay: Payer: Medicare Other | Attending: Hematology & Oncology

## 2020-02-19 ENCOUNTER — Other Ambulatory Visit: Payer: Self-pay

## 2020-02-19 VITALS — BP 138/55 | HR 59 | Temp 98.0°F | Resp 18 | Ht 61.0 in | Wt 213.8 lb

## 2020-02-19 DIAGNOSIS — D500 Iron deficiency anemia secondary to blood loss (chronic): Secondary | ICD-10-CM

## 2020-02-19 DIAGNOSIS — I872 Venous insufficiency (chronic) (peripheral): Secondary | ICD-10-CM | POA: Insufficient documentation

## 2020-02-19 DIAGNOSIS — N184 Chronic kidney disease, stage 4 (severe): Secondary | ICD-10-CM | POA: Diagnosis not present

## 2020-02-19 DIAGNOSIS — R202 Paresthesia of skin: Secondary | ICD-10-CM | POA: Diagnosis not present

## 2020-02-19 DIAGNOSIS — R200 Anesthesia of skin: Secondary | ICD-10-CM | POA: Insufficient documentation

## 2020-02-19 DIAGNOSIS — L819 Disorder of pigmentation, unspecified: Secondary | ICD-10-CM | POA: Insufficient documentation

## 2020-02-19 DIAGNOSIS — Z86711 Personal history of pulmonary embolism: Secondary | ICD-10-CM | POA: Diagnosis not present

## 2020-02-19 DIAGNOSIS — Z7901 Long term (current) use of anticoagulants: Secondary | ICD-10-CM | POA: Diagnosis not present

## 2020-02-19 DIAGNOSIS — D509 Iron deficiency anemia, unspecified: Secondary | ICD-10-CM | POA: Insufficient documentation

## 2020-02-19 DIAGNOSIS — D693 Immune thrombocytopenic purpura: Secondary | ICD-10-CM

## 2020-02-19 DIAGNOSIS — Z881 Allergy status to other antibiotic agents status: Secondary | ICD-10-CM | POA: Diagnosis not present

## 2020-02-19 DIAGNOSIS — I4891 Unspecified atrial fibrillation: Secondary | ICD-10-CM | POA: Insufficient documentation

## 2020-02-19 DIAGNOSIS — M7989 Other specified soft tissue disorders: Secondary | ICD-10-CM | POA: Diagnosis not present

## 2020-02-19 DIAGNOSIS — Z882 Allergy status to sulfonamides status: Secondary | ICD-10-CM | POA: Insufficient documentation

## 2020-02-19 DIAGNOSIS — D649 Anemia, unspecified: Secondary | ICD-10-CM

## 2020-02-19 DIAGNOSIS — Z886 Allergy status to analgesic agent status: Secondary | ICD-10-CM | POA: Diagnosis not present

## 2020-02-19 DIAGNOSIS — Z888 Allergy status to other drugs, medicaments and biological substances status: Secondary | ICD-10-CM | POA: Diagnosis not present

## 2020-02-19 DIAGNOSIS — D696 Thrombocytopenia, unspecified: Secondary | ICD-10-CM | POA: Diagnosis not present

## 2020-02-19 LAB — LACTATE DEHYDROGENASE: LDH: 174 U/L (ref 98–192)

## 2020-02-19 LAB — CBC WITH DIFFERENTIAL (CANCER CENTER ONLY)
Abs Immature Granulocytes: 0.03 10*3/uL (ref 0.00–0.07)
Basophils Absolute: 0 10*3/uL (ref 0.0–0.1)
Basophils Relative: 0 %
Eosinophils Absolute: 0.2 10*3/uL (ref 0.0–0.5)
Eosinophils Relative: 3 %
HCT: 34.3 % — ABNORMAL LOW (ref 36.0–46.0)
Hemoglobin: 11.4 g/dL — ABNORMAL LOW (ref 12.0–15.0)
Immature Granulocytes: 1 %
Lymphocytes Relative: 20 %
Lymphs Abs: 1.3 10*3/uL (ref 0.7–4.0)
MCH: 32.3 pg (ref 26.0–34.0)
MCHC: 33.2 g/dL (ref 30.0–36.0)
MCV: 97.2 fL (ref 80.0–100.0)
Monocytes Absolute: 0.5 10*3/uL (ref 0.1–1.0)
Monocytes Relative: 8 %
Neutro Abs: 4.4 10*3/uL (ref 1.7–7.7)
Neutrophils Relative %: 68 %
Platelet Count: 132 10*3/uL — ABNORMAL LOW (ref 150–400)
RBC: 3.53 MIL/uL — ABNORMAL LOW (ref 3.87–5.11)
RDW: 16.1 % — ABNORMAL HIGH (ref 11.5–15.5)
WBC Count: 6.5 10*3/uL (ref 4.0–10.5)
nRBC: 0 % (ref 0.0–0.2)

## 2020-02-19 LAB — CMP (CANCER CENTER ONLY)
ALT: 15 U/L (ref 0–44)
AST: 19 U/L (ref 15–41)
Albumin: 4.3 g/dL (ref 3.5–5.0)
Alkaline Phosphatase: 67 U/L (ref 38–126)
Anion gap: 10 (ref 5–15)
BUN: 52 mg/dL — ABNORMAL HIGH (ref 8–23)
CO2: 30 mmol/L (ref 22–32)
Calcium: 10.3 mg/dL (ref 8.9–10.3)
Chloride: 101 mmol/L (ref 98–111)
Creatinine: 1.65 mg/dL — ABNORMAL HIGH (ref 0.44–1.00)
GFR, Est AFR Am: 33 mL/min — ABNORMAL LOW (ref >60)
GFR, Estimated: 29 mL/min — ABNORMAL LOW (ref >60)
Glucose, Bld: 140 mg/dL — ABNORMAL HIGH (ref 70–99)
Potassium: 4.2 mmol/L (ref 3.5–5.1)
Sodium: 141 mmol/L (ref 135–145)
Total Bilirubin: 0.8 mg/dL (ref 0.3–1.2)
Total Protein: 7.5 g/dL (ref 6.5–8.1)

## 2020-02-19 NOTE — Telephone Encounter (Signed)
Appointments scheduled calendar printed per 6/14 los

## 2020-02-19 NOTE — Progress Notes (Signed)
Hematology and Oncology Follow Up Visit  [name] [id number] 06-13-1935 85 y.o. 02/19/2020   Principle Diagnosis:  Immune based thrombocytopenia History of PE Iron deficiency anemia Atrial fib  Current Therapy: Nplate q3week for platelet count < 100K IV Iron as indicated Eliquis 2.5 mg po BID   Interim History:  [name] is here today for follow-up. She is doing well and has no complaints at this time.  She is now ambulating on her own with a walker. She is so excited about this and has worked so hard in therapy.  Hgb is stable at 11.4 and platelets 132.  She has not noted any episodes of bleeding. She bruises easily but not in excess.  No fever, chills, n/v, cough, rash, dizziness, SOB, chest pain, palpitations, abdominal pain or changes in bowel or bladder habits.  The swelling in her lower extremities continues to improve. She does have some hyperpigmentation with venous insufficiency.  She has numbness and tingling in her left pinky.  No tenderness in her extremities.  She has maintained a good appetite and is staying well hydrated. Her weight is stable at 213 lbs.   ECOG Performance Status: 1 - Symptomatic but completely ambulatory  Medications:  Allergies as of 02/19/2020      Reactions   Adhesive [tape] Other (See Comments)   PULLS OFF THE SKIN   Apixaban Other (See Comments)   Internal Bleeding   Aspirin Itching, Rash, Hives, Swelling   Swelling of her tongue   Mirabegron Other (See Comments)   Patient experienced A-Fib   Pineapple Anaphylaxis, Swelling   Throat swells and blisters on tongue and roof of mouth per patient   Metformin Diarrhea, Nausea Only   Tetracycline Hives   Fluticasone-salmeterol Itching, Rash   Iodinated Diagnostic Agents Itching, Rash      Lactose Intolerance (gi) Other (See Comments)   Gas   Latex Itching, Rash, Other (See Comments)   Pulls off the skin and causes welts   Lisinopril Cough   Metronidazole Other  (See Comments)   Reaction not known   Sulfa Antibiotics Rash   Sulfonamide Derivatives Itching, Rash      Medication List       Accurate as of February 19, 2020  1:46 PM. If you have any questions, ask your nurse or doctor.        acetaminophen 325 MG tablet Commonly known as: TYLENOL Take 650 mg by mouth every 6 (six) hours as needed for moderate pain or fever (for fever or pain/discomfort).   atorvastatin 40 MG tablet Commonly known as: LIPITOR Take 40 mg by mouth at bedtime.   bisacodyl 5 MG EC tablet Commonly known as: DULCOLAX Take 10 mg by mouth every 3 (three) days as needed (for constipation).   feeding supplement (PRO-STAT SUGAR FREE 64) Liqd Take 30 mLs by mouth 2 (two) times daily.   fenofibrate 54 MG tablet TAKE 1 TABLET(54 MG) BY MOUTH DAILY   fexofenadine 180 MG tablet Commonly known as: ALLEGRA Take 180 mg by mouth daily.   FreeStyle Libre 14 Day Sensor Misc Apply new sensor every 14 days.   furosemide 20 MG tablet Commonly known as: LASIX Take 1 tablet (20 mg total) by mouth daily. Take 20 mg by mouth in the morning on Sun/Tues/Thurs/Sat 40mg  on Mon/Wed/Friday   HYDROcodone-acetaminophen 5-325 MG tablet Commonly known as: NORCO/VICODIN Take 1 tablet by mouth every 6 (six) hours as needed for moderate pain.   hyoscyamine 0.125 MG tablet Commonly known as:  LEVSIN Take 2 tablets by mouth every 4 (four) hours as needed.   insulin lispro 100 UNIT/ML KwikPen Commonly known as: HumaLOG KwikPen Inject 0.13-0.2 mLs (13-20 Units total) into the skin with breakfast, with lunch, and with evening meal.   Insulin Pen Needle 32G X 4 MM Misc Use 4x a day   INSULIN SYRINGE .3CC/29GX1/2" 29G X 1/2" 0.3 ML Misc Use to inject insulin 4 times a day.   Lantus SoloStar 100 UNIT/ML Solostar Pen Generic drug: insulin glargine Inject 50 Units into the skin daily.   levothyroxine 112 MCG tablet Commonly known as: SYNTHROID TAKE 1 TABLET BY MOUTH DAILY    multivitamin tablet Take 1 tablet by mouth daily.   omeprazole 20 MG capsule Commonly known as: PRILOSEC Take 1 capsule (20 mg total) by mouth daily.   potassium citrate 10 MEQ (1080 MG) SR tablet Commonly known as: UROCIT-K Take 2 tablets (20 mEq total) by mouth daily.   sertraline 25 MG tablet Commonly known as: ZOLOFT Take 50 mg by mouth every evening.   sertraline 50 MG tablet Commonly known as: ZOLOFT Take 50 mg by mouth daily.   vitamin C 250 MG tablet Commonly known as: ASCORBIC ACID Take 250 mg by mouth daily.   Vitamin D3 50 MCG (2000 UT) Tabs Take 2,000 Units by mouth daily.       Allergies:  Allergies  Allergen Reactions  . Adhesive [Tape] Other (See Comments)    PULLS OFF THE SKIN  . Apixaban Other (See Comments)    Internal Bleeding  . Aspirin Itching, Rash, Hives and Swelling    Swelling of her tongue  . Mirabegron Other (See Comments)    Patient experienced A-Fib  . Pineapple Anaphylaxis and Swelling    Throat swells and blisters on tongue and roof of mouth per patient  . Metformin Diarrhea and Nausea Only  . Tetracycline Hives  . Fluticasone-Salmeterol Itching and Rash  . Iodinated Diagnostic Agents Itching and Rash       . Lactose Intolerance (Gi) Other (See Comments)    Gas   . Latex Itching, Rash and Other (See Comments)    Pulls off the skin and causes welts  . Lisinopril Cough  . Metronidazole Other (See Comments)    Reaction not known  . Sulfa Antibiotics Rash  . Sulfonamide Derivatives Itching and Rash    Past Medical History, Surgical history, Social history, and Family History were reviewed and updated.  Review of Systems: All other 10 point review of systems is negative.   Physical Exam:  vitals were not taken for this visit.   Wt Readings from Last 3 Encounters:  01/19/20 215 lb (97.5 kg)  01/12/20 214 lb (97.1 kg)  01/04/20 208 lb 8 oz (94.6 kg)    Ocular: Sclerae unicteric, pupils equal, round and reactive to  light Ear-nose-throat: Oropharynx clear, dentition fair Lymphatic: No cervical or supraclavicular adenopathy Lungs no rales or rhonchi, good excursion bilaterally Heart regular rate and rhythm, no murmur appreciated Abd soft, nontender, positive bowel sounds, no liver or spleen tip palpated on exam, no fluid wave  MSK no focal spinal tenderness, no joint edema Neuro: non-focal, well-oriented, appropriate affect Breasts: Deferred   Lab Results  Component Value Date   WBC 6.5 02/19/2020   HGB 11.4 (L) 02/19/2020   HCT 34.3 (L) 02/19/2020   MCV 97.2 02/19/2020   PLT 132 (L) 02/19/2020   Lab Results  Component Value Date   FERRITIN 470 (H) 01/04/2020   IRON  75 01/04/2020   TIBC 276 01/04/2020   UIBC 201 01/04/2020   IRONPCTSAT 27 01/04/2020   Lab Results  Component Value Date   RETICCTPCT 2.8 07/05/2019   RBC 3.53 (L) 02/19/2020   RETICCTABS 73.3 01/28/2011   No results found for: KPAFRELGTCHN, LAMBDASER, KAPLAMBRATIO No results found for: Kandis Cocking, IGMSERUM No results found for: Odetta Pink, SPEI   Chemistry      Component Value Date/Time   NA 140 01/25/2020 1343   NA 145 09/08/2017 1317   NA 142 02/25/2017 1013   K 4.4 01/25/2020 1343   K 4.5 09/08/2017 1317   K 3.8 02/25/2017 1013   CL 98 01/25/2020 1343   CL 98 09/08/2017 1317   CO2 32 01/25/2020 1343   CO2 32 09/08/2017 1317   CO2 26 02/25/2017 1013   BUN 54 (H) 01/25/2020 1343   BUN 34 (H) 09/08/2017 1317   BUN 20.1 02/25/2017 1013   CREATININE 1.64 (H) 01/25/2020 1343   CREATININE 1.9 (H) 09/08/2017 1317   CREATININE 1.8 (H) 02/25/2017 1013      Component Value Date/Time   CALCIUM 10.2 01/25/2020 1343   CALCIUM 9.3 09/08/2017 1317   CALCIUM 8.7 02/25/2017 1013   ALKPHOS 88 01/25/2020 1343   ALKPHOS 45 09/08/2017 1317   ALKPHOS 65 02/25/2017 1013   AST 20 01/25/2020 1343   AST 16 02/25/2017 1013   ALT 17 01/25/2020 1343   ALT 26 09/08/2017  1317   ALT 13 02/25/2017 1013   BILITOT 1.0 01/25/2020 1343   BILITOT 0.76 02/25/2017 1013       Impression and Plan: Ms. Dardis is a very pleasant 85yo caucasian female with immune based thrombocytopenia. No Nplate or Retacrit needed this visit. Her counts remain stable.  We will see what her iron studies look like and replace if needed.  We will plan to see her again in another 3 weeks for lab and injection only and follow-up in 6 weeks.  They will contact our office with any questions or concerns. We can certainly see her sooner if needed.  Laverna Peace, NP 6/14/20211:46 PM

## 2020-02-20 LAB — IRON AND TIBC
Iron: 69 ug/dL (ref 41–142)
Saturation Ratios: 23 % (ref 21–57)
TIBC: 304 ug/dL (ref 236–444)
UIBC: 235 ug/dL (ref 120–384)

## 2020-02-20 LAB — FERRITIN: Ferritin: 404 ng/mL — ABNORMAL HIGH (ref 11–307)

## 2020-02-22 IMAGING — DX DG CHEST 1V PORT
2 series · 2 of 2 positions shown · non-contrast
Comparison: none

CLINICAL DATA: Shortness of breath

EXAM:
PORTABLE CHEST 1 VIEW
July 22, 2019

[chest ap (1 of 2)]
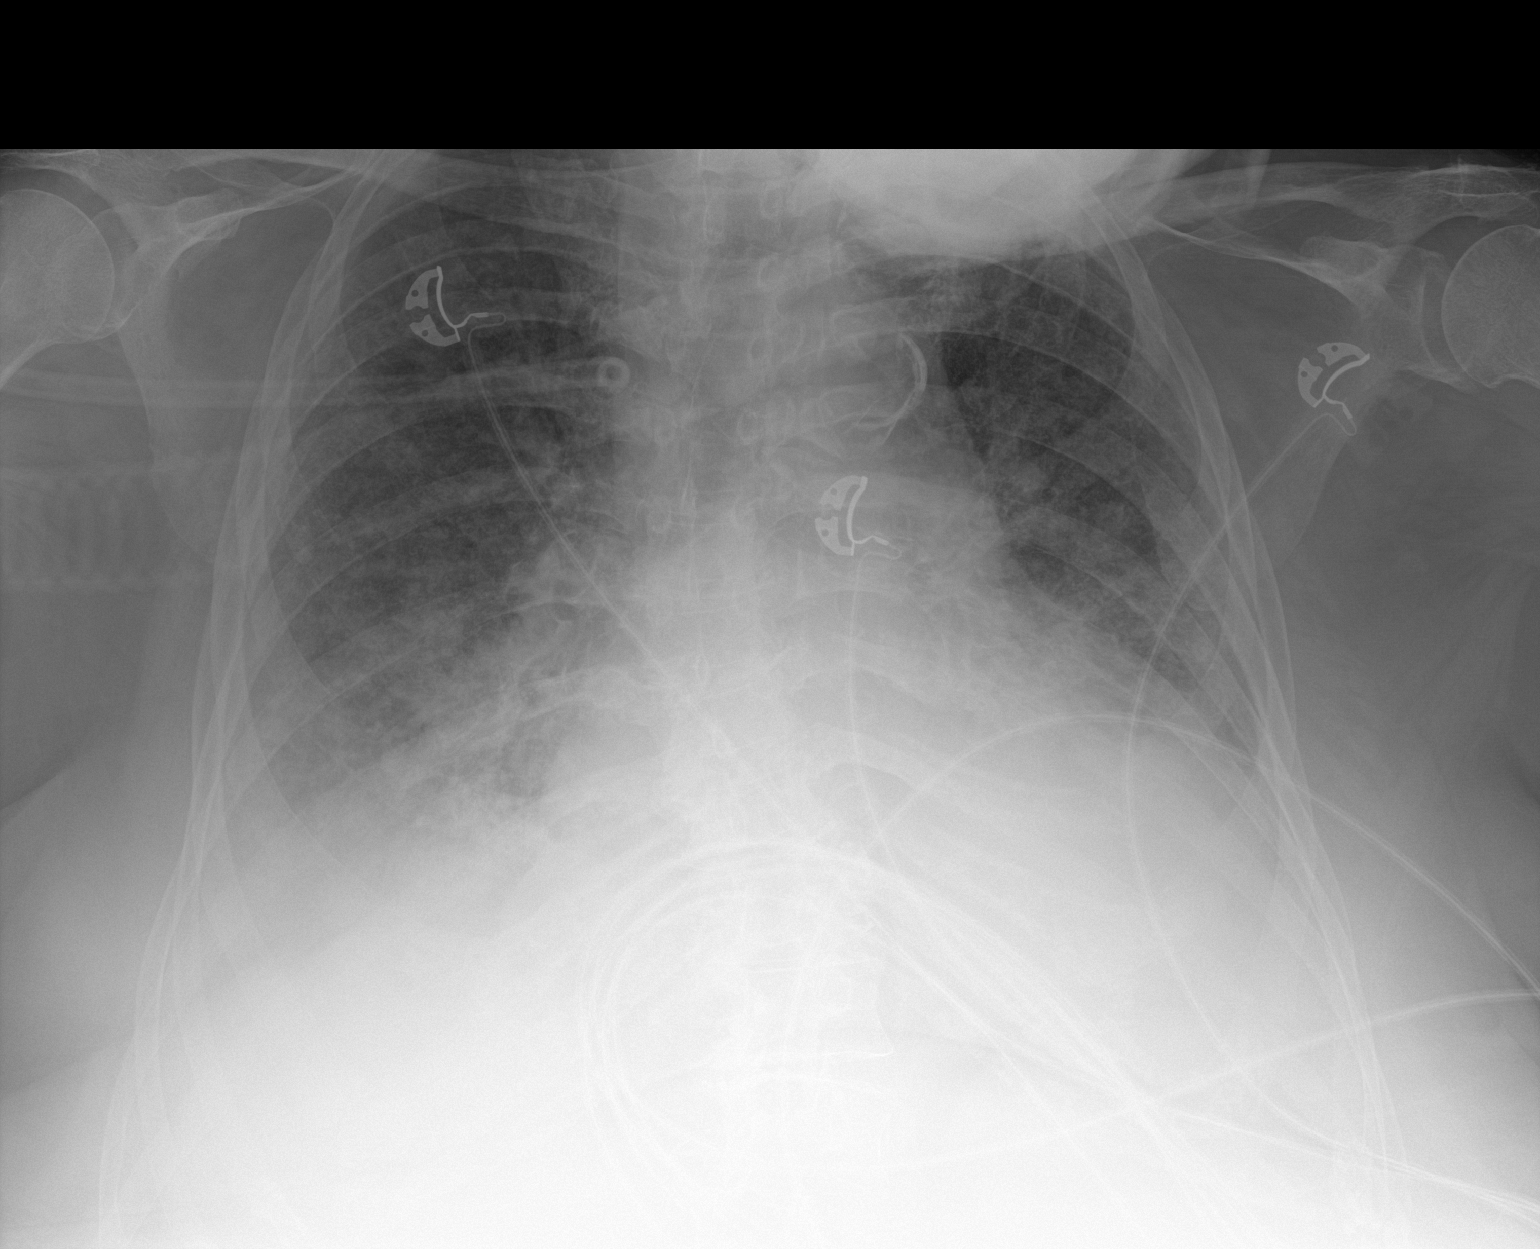

[chest ap (2 of 2)]
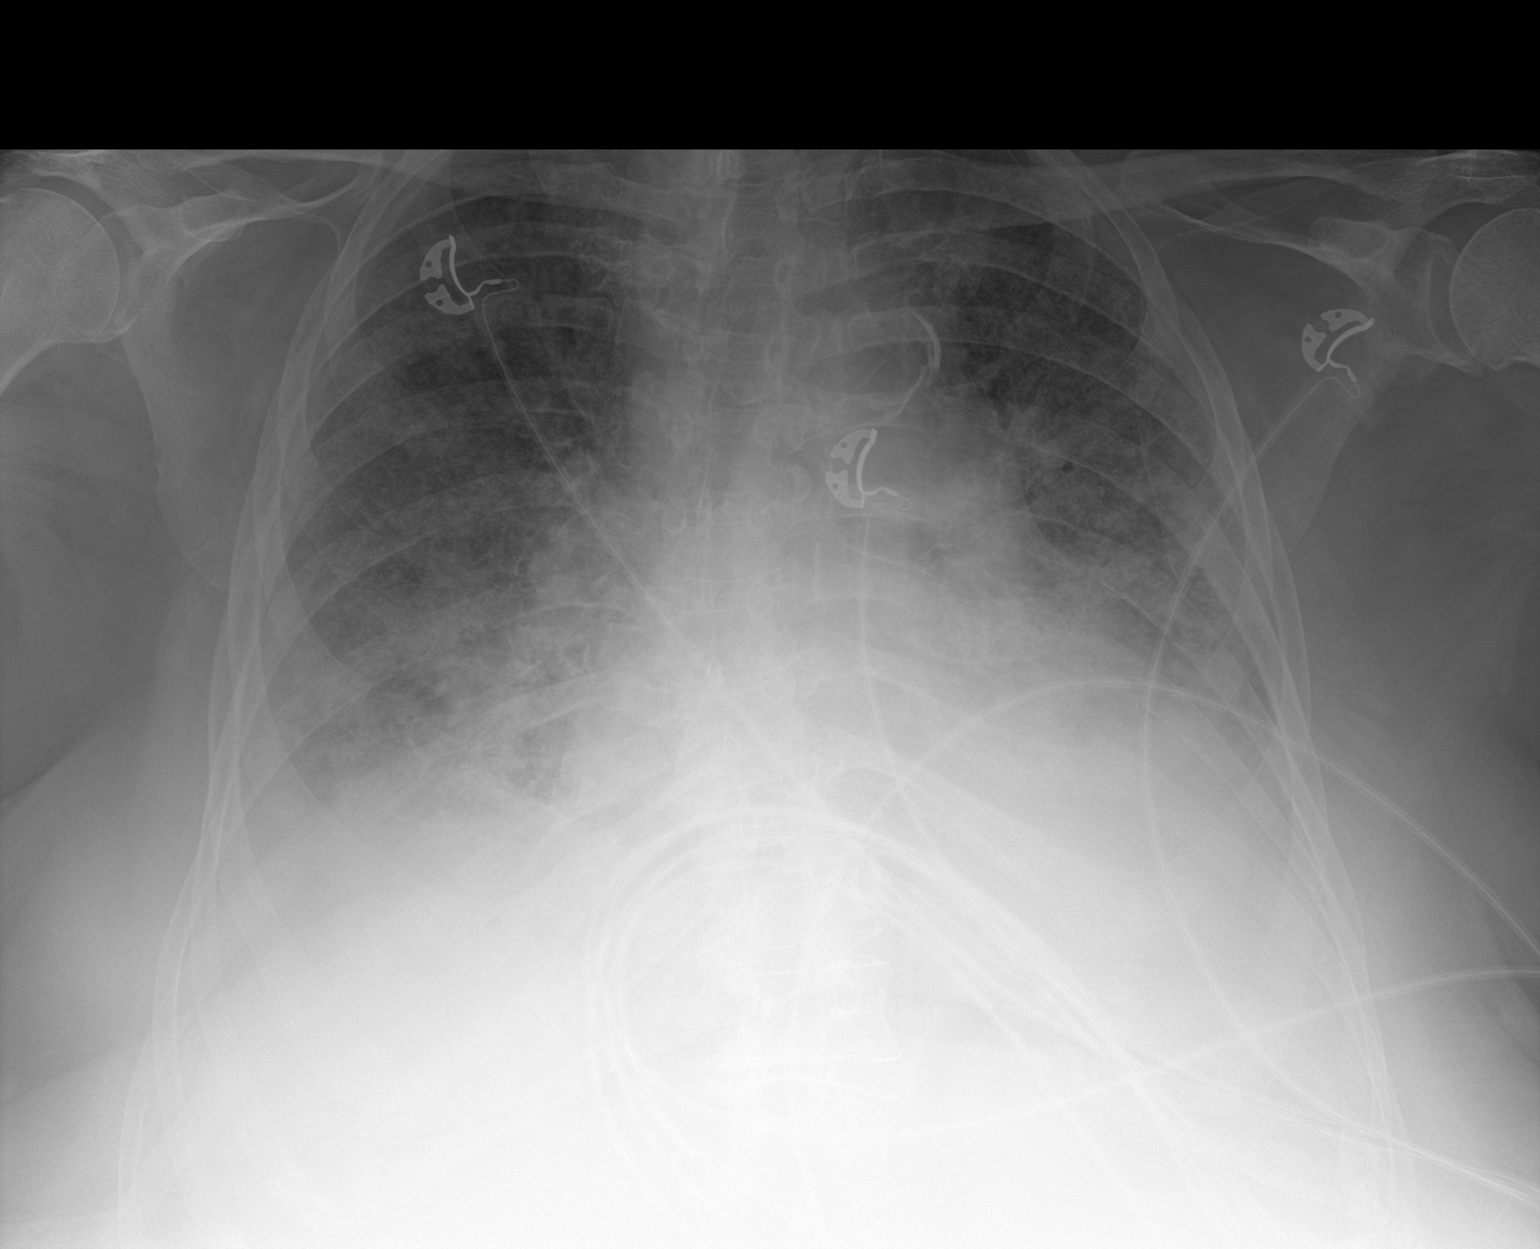

[2 of 2 positions shown; findings below may reference images not displayed]

FINDINGS: There is cardiomegaly with pulmonary venous hypertension. There are
pleural effusions bilaterally. There is airspace consolidation in
each lung base region, more on the left than on the right. There is
also interstitial edema which is similar to recent prior study.
There is slightly less airspace opacity in the right upper lobe
compared to most recent study. No new opacity is evident.

There is aortic atherosclerosis.  No adenopathy.  No bone lesions.
IMPRESSION: Cardiomegaly with pulmonary vascular congestion. Persistent pleural
effusions. Interstitial edema is present with airspace consolidation
in the lung bases. Question alveolar edema versus pneumonia in the
bases. Both entities may exist concurrently.

There is less airspace opacity in the right upper lobe compared to
most recent study. No new opacity evident on either side.

Overall appearance is felt to be most indicative of congestive heart
failure. A degree of superimposed ARDS is possible.

Aortic Atherosclerosis ([id number].P).

## 2020-02-24 IMAGING — DX DG CHEST 1V PORT
1 series · 1 of 1 positions shown · non-contrast
Comparison: Chest radiograph dated 07/24/2019.

CLINICAL DATA: 83-year-old female with acute respiratory failure.

EXAM:
PORTABLE CHEST 1 VIEW

[chest ap]
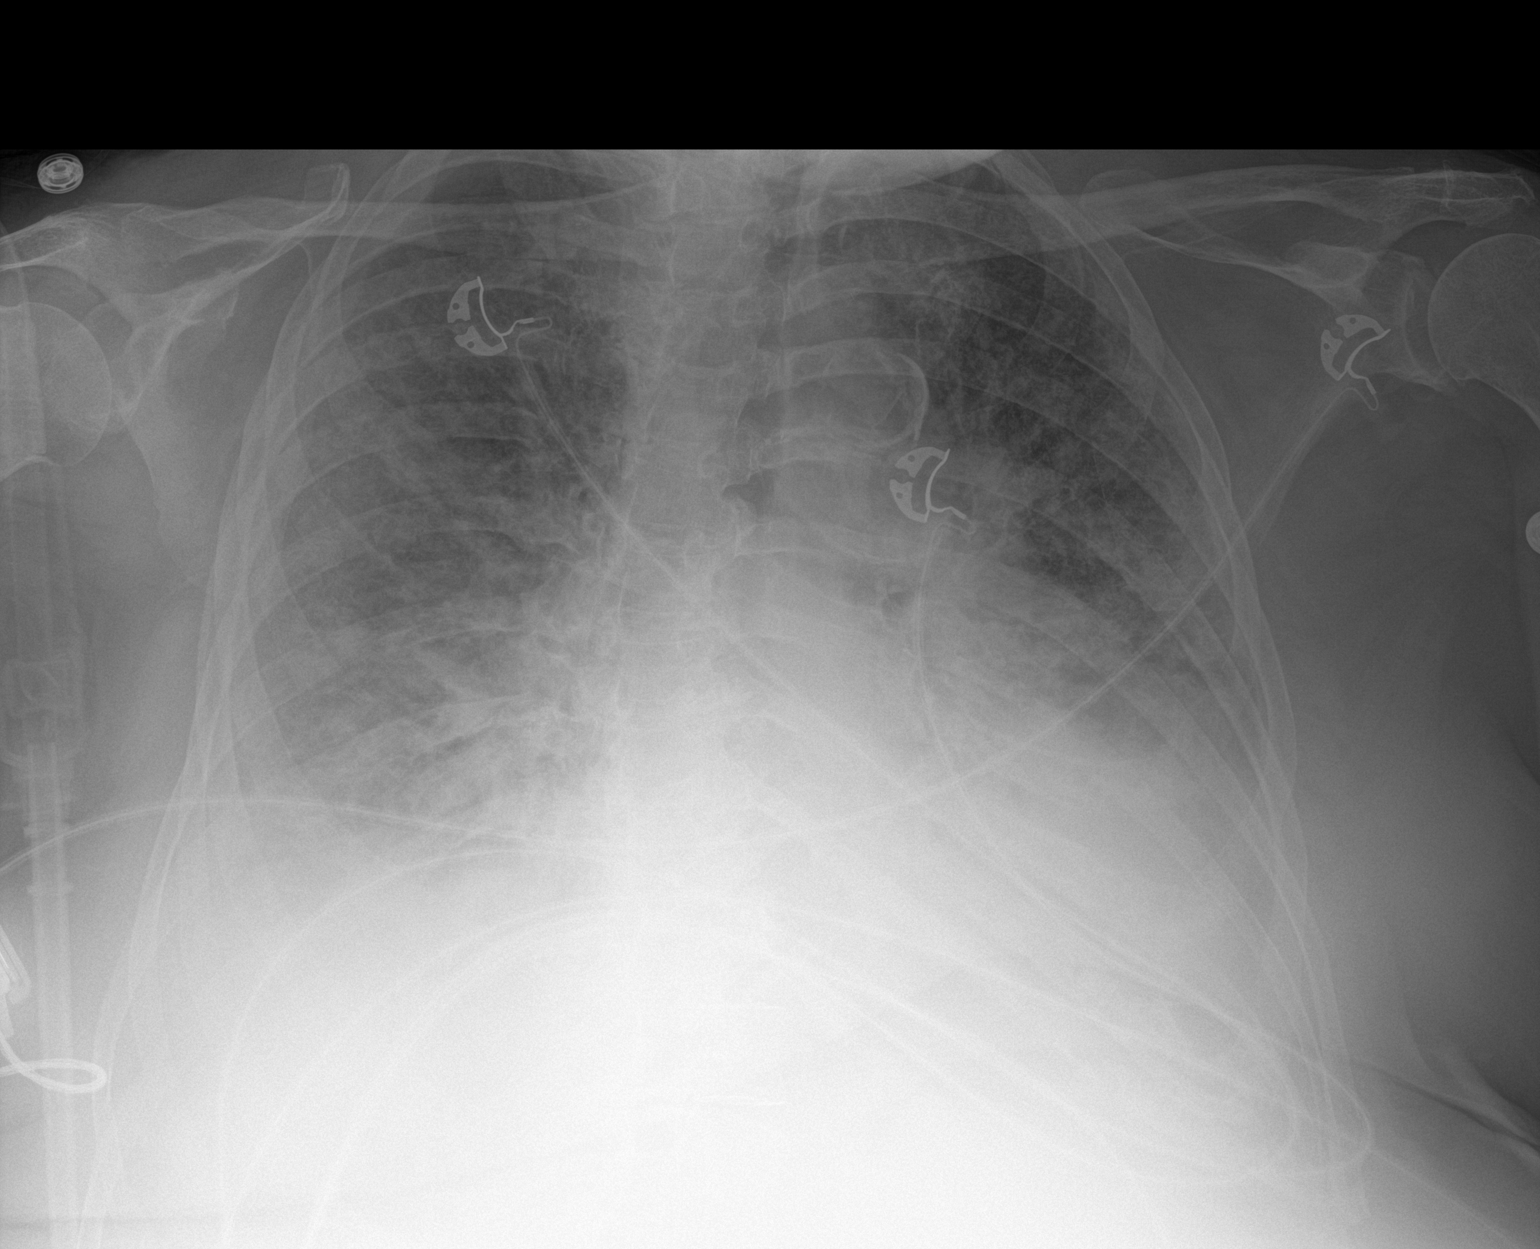

[1 of 1 positions shown; findings below may reference images not displayed]

FINDINGS: Interval worsening of the interstitial and airspace disease since
the prior radiograph likely represent worsening CHF, pneumonia, or
ARDS. Clinical correlation is recommended. Small bilateral pleural
effusions as seen previously. No pneumothorax. Stable cardiomegaly.
Atherosclerotic calcification of the aorta. No acute osseous
pathology.
IMPRESSION: 1. Interval worsening of the interstitial and airspace disease
compared to the prior radiograph.
2. Stable cardiomegaly.
3. Small bilateral pleural effusions.

## 2020-02-26 LAB — HM DIABETES EYE EXAM

## 2020-02-29 IMAGING — DX DG CHEST 1V PORT
1 series · 1 of 1 positions shown · non-contrast
Comparison: 07/28/2019 and earlier.

CLINICAL DATA: 83-year-old female with shortness of breath.

EXAM:
PORTABLE CHEST 1 VIEW

[chest ap]
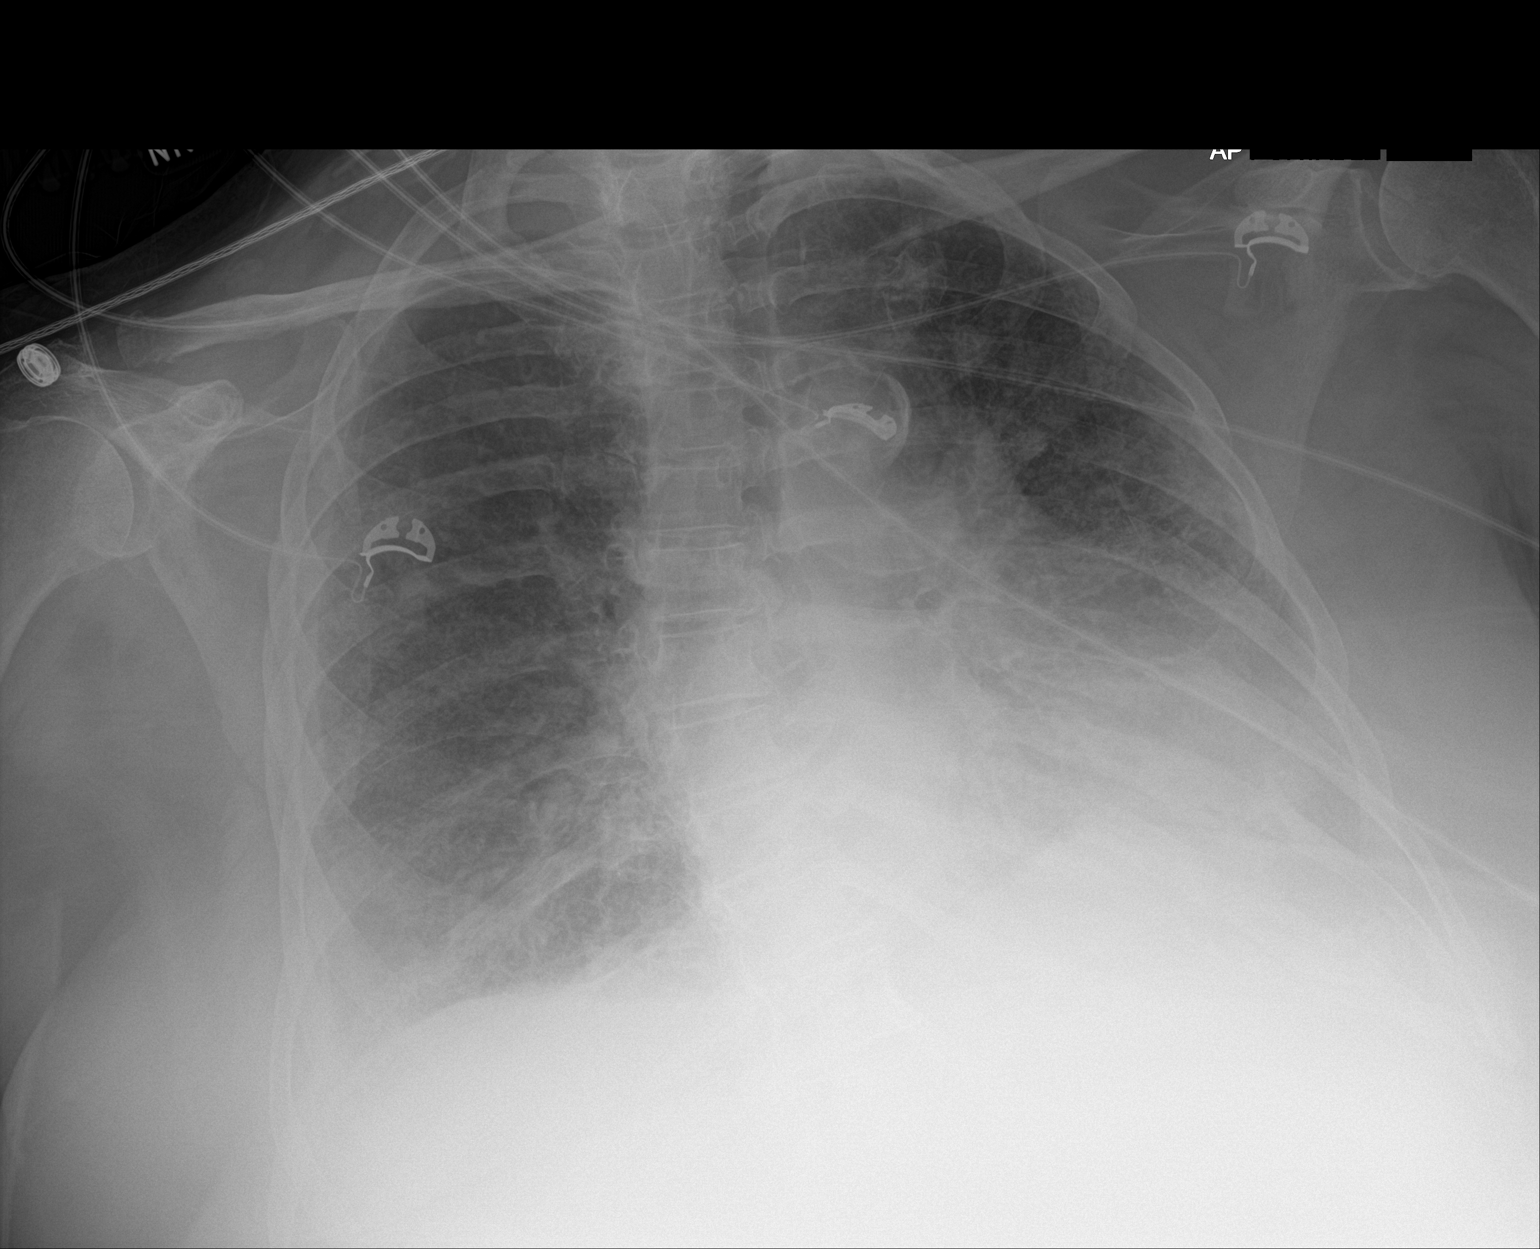

[1 of 1 positions shown; findings below may reference images not displayed]

FINDINGS: Portable AP upright view at 7966 hours. Stable lung volumes and
mediastinal contours. Continued bilateral pulmonary interstitial
opacity although ventilation has improved since 07/27/2019. No
superimposed pneumothorax or consolidation. No pleural effusion is
evident. Paucity of bowel gas in the upper abdomen. Visualized
tracheal air column is within normal limits. No acute osseous
abnormality identified.
IMPRESSION: 1. Improved bilateral ventilation since 07/27/2019 with continued
widespread interstitial opacity.
2. No new cardiopulmonary abnormality.

## 2020-03-04 ENCOUNTER — Ambulatory Visit: Payer: Medicare Other | Attending: Orthopaedic Surgery | Admitting: Physical Therapy

## 2020-03-04 ENCOUNTER — Other Ambulatory Visit: Payer: Self-pay

## 2020-03-04 DIAGNOSIS — M25561 Pain in right knee: Secondary | ICD-10-CM | POA: Diagnosis present

## 2020-03-04 DIAGNOSIS — M6281 Muscle weakness (generalized): Secondary | ICD-10-CM

## 2020-03-04 DIAGNOSIS — R2689 Other abnormalities of gait and mobility: Secondary | ICD-10-CM | POA: Diagnosis present

## 2020-03-04 DIAGNOSIS — G8929 Other chronic pain: Secondary | ICD-10-CM

## 2020-03-04 DIAGNOSIS — R262 Difficulty in walking, not elsewhere classified: Secondary | ICD-10-CM

## 2020-03-04 IMAGING — DX DG CHEST 1V
1 series · 1 of 1 positions shown · non-contrast
Comparison: 07/31/2019 and earlier.

CLINICAL DATA: Acute onset of shortness of breath after being
recently discharged from the hospital after treatment for acute
respiratory failure.

EXAM:
Portable CHEST 1 VIEW

[chest ap]
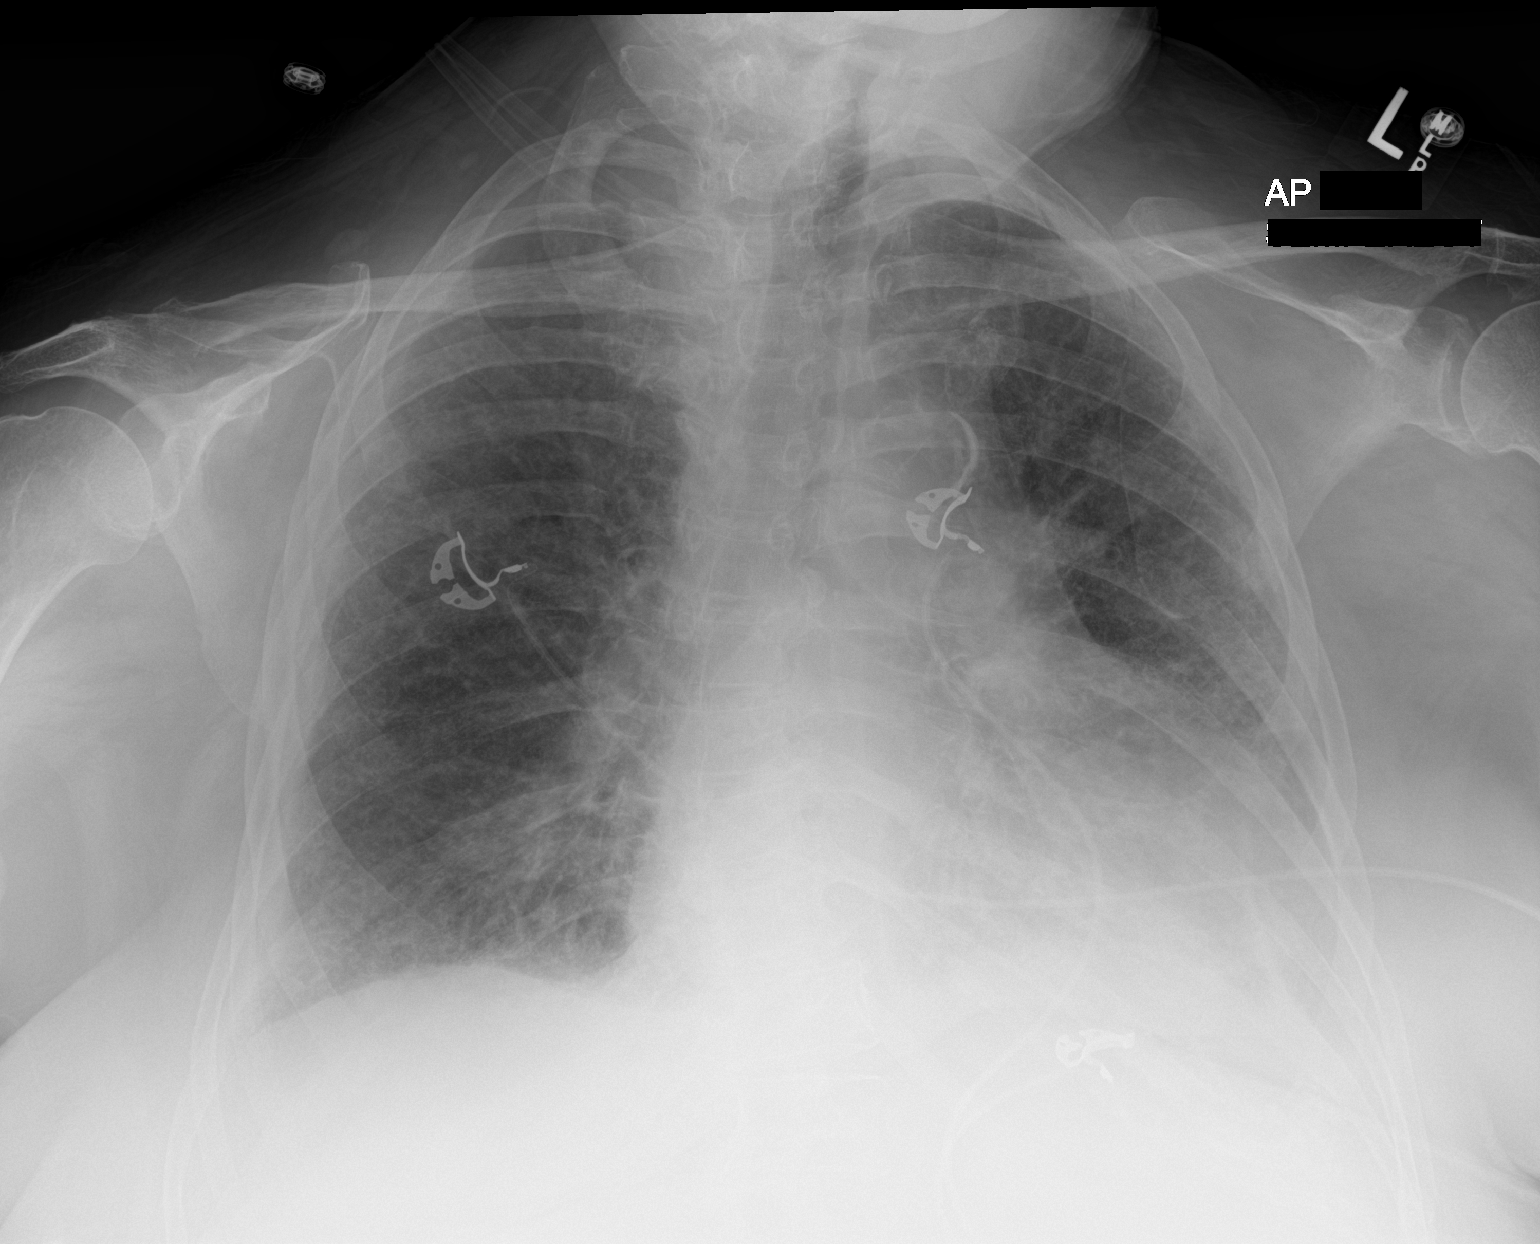

[1 of 1 positions shown; findings below may reference images not displayed]

FINDINGS: Cardiac silhouette moderately enlarged, unchanged. Airspace
consolidation in the LEFT LOWER LOBE, new since the most recent
examination, with silhouetting of the LEFT hemidiaphragm. Minimal
patchy airspace opacities medially at the RIGHT lung base, also new.
Pulmonary vascularity normal without evidence of pulmonary edema
currently.
IMPRESSION: 1. Acute pneumonia in the LEFT lower lobe and to a lesser extent the
RIGHT lung base.
2. Stable cardiomegaly without pulmonary edema currently.

## 2020-03-04 NOTE — Therapy (Signed)
[place], [place], [address] Phone: [phone]   Fax:  [phone]  Physical Therapy Evaluation  Patient Details  Name: [name] MRN: [id number] Date of Birth: [birth year] Referring Provider (PT): Mcarthur Rossetti, MD   Encounter Date: 03/04/2020   PT End of Session - 03/04/20 1045    Visit Number 1    Number of Visits 12    Authorization Type MCR -- needs 10th visit progress note    PT Start Time 1045    PT Stop Time 1135    PT Time Calculation (min) 50 min    Activity Tolerance Patient tolerated treatment well    Behavior During Therapy [facility] for tasks assessed/performed           Past Medical History:  Diagnosis Date  . Allergic rhinitis   . Anxiety state, unspecified    panic attacks  . CHF (congestive heart failure) ([place])   . Depressive disorder, not elsewhere classified   . Extrinsic asthma, unspecified    no problem since adulthood  . Obesity   . OSA on CPAP    severe  . Pure hypercholesterolemia   . Respiratory failure with hypoxia ([place]) 09/2008   acute, secondary to multiple bilateral pulmonary embolism , negative hypercoagulable workup 09/2008 hospital stay  . Scoliosis   . Type II or unspecified type diabetes mellitus without mention of complication, not stated as uncontrolled   . Unspecified essential hypertension   . Unspecified hypothyroidism    hypo    Past Surgical History:  Procedure Laterality Date  . CARDIOVERSION N/A 06/26/2019   Procedure: CARDIOVERSION;  Surgeon: Jerline Pain, MD;  Location: [facility] ENDOSCOPY;  Service: Cardiovascular;  Laterality: N/A;  . EYE SURGERY    . JOINT REPLACEMENT    . knee replaced    . THYROID SURGERY      There were no vitals filed for this visit.    Subjective Assessment - 03/04/20 1051    Subjective Pt reports she wears ankle brace as needed. She reports history of 3 months of no ambulation and 6 week hospital stay. Pt  has been receiving HHPT and has been able to get back to walking but this remains her biggest issue. Pt used to be from ALF. Pt's nurse caregiver has been mostly assisting pt with donning/doffing shoes, dressing, and toileting. Caregiver stays from 10am to 6pm. Pt reports she is living exclusively with daughter in an accessible home. She reports she is normally able to amb short distances without use of rollator and only used it for safety. Pt states her and her caregiver perform her HEP from HHPT daily.    Patient is accompained by: --   Caregiver/nurse   Limitations Walking;Lifting;House hold activities    How long can you walk comfortably? Needs rest breaks; limited due to respiratory status    Patient Stated Goals Return back to PLOF; less reliant on rollator    Currently in Pain? No/denies              [facility] PT Assessment - 03/04/20 0001      Assessment   Medical Diagnosis M25.571 (ICD-10-CM) - Pain in right ankle and joints of right foot    Referring Provider (PT) Mcarthur Rossetti, MD      Balance Screen   Has the patient fallen in the past 6 months No      [place] residence    Living  Arrangements Children    Available Help at Discharge Personal care attendant;Family    Home Access Level entry    Home Layout One level      Prior Function   Level of Independence Independent with household mobility with device    Vocation Retired      Observation/Other Assessments   Focus on Therapeutic Outcomes (FOTO)  37%      AROM   Right Ankle Dorsiflexion --   WFL   Right Ankle Plantar Flexion --   wfl   Right Ankle Inversion --   WFL   Right Ankle Eversion --   [facility]     Strength   Right Hip Flexion 3+/5    Right Hip External Rotation  3+/5    Right Hip Internal Rotation 4+/5    Right Hip ABduction 4+/5    Right Hip ADduction 4+/5    Right Knee Flexion 4-/5    Right Knee Extension 4-/5    Right Ankle Dorsiflexion 4+/5    Right  Ankle Plantar Flexion 3+/5    Right Ankle Inversion 4+/5    Right Ankle Eversion 4+/5      Transfers   Five time sit to stand comments  18 sec    Number of Reps 1 set      Ambulation/Gait   Ambulation Distance (Feet) 50 Feet    Assistive device Rollator    Gait Pattern Step-through pattern    Ambulation Surface Level                      Objective measurements completed on examination: See above findings.       [facility] [place] Adult PT Treatment/Exercise - 03/04/20 0001      Knee/Hip Exercises: Standing   Heel Raises 10 reps   difficulty coming to full heel raise   Hip Abduction Stengthening;Both;10 reps    Abduction Limitations green tband    Hip Extension Stengthening;Both;10 reps    Extension Limitations green tband      Ankle Exercises: Seated   Towel Crunch --   10 reps   Towel Inversion/Eversion --   10 reps   Other Seated Ankle Exercises Plantarflexion with green tband x 10 reps                  PT Education - 03/04/20 1303    Education Details Discussed how foot intrinsics assist with balance. Discussed progressing her exercises at home.            PT Short Term Goals - 03/04/20 1310      PT SHORT TERM GOAL #1   Title be independent in initial HEP    Baseline issued at eval    Time 3    Period Weeks    Status New    Target Date 03/25/20             PT Long Term Goals - 03/04/20 1311      PT LONG TERM GOAL #1   Title be independent in advanced HEP    Time 6    Period Weeks    Status New    Target Date 04/15/20      PT LONG TERM GOAL #2   Title Pt will improve 5 times sit<>stand to <13 sec for decreased fall risk    Baseline 18 sec sit<>stand    Time 6    Period Weeks    Status New    Target Date 04/15/20  PT LONG TERM GOAL #3   Title Pt will be able to amb with normal reciprocal gait pattern without a/d for ~50'    Baseline Unable to safely amb without rollator    Time 6    Period Weeks    Status New    Target Date  04/15/20      PT LONG TERM GOAL #4   Title Pt will demonstrate at least 4+/5 bilat LE strength bilat    Baseline 3+/5 to 4+/5 R LE    Time 6    Period Weeks    Status New    Target Date 04/15/20      PT LONG TERM GOAL #5   Title Pt will have improved FOTO score to 31% impairment    Baseline 37% FOTO    Time 6    Period Weeks    Status New    Target Date 04/15/20                  Plan - 03/04/20 1304    Clinical Impression Statement Pt is an 85 y/o F presenting to OPPT after history of R ankle fracture (non-op) and 3 months of no ambulation resulting in deconditioning and decreased balance. Pt with history of L TKA and chronic R knee pain with neuropathy. Pt presents with general deconditioning, high fall risk with decreased balance, and decreased activity tolerance affecting pt's ability to amb at her baseline status for home and community tasks. Pt would benefit from therapy to optimize her level of function and decrease fall risk.    Personal Factors and Comorbidities Age;Comorbidity 1;Comorbidity 2;Comorbidity 3+    Comorbidities osteoporosis, chronic R knee OA/pain, diabetes with neuropathy    Examination-Activity Limitations Caring for Others;Carry;Lift;Locomotion Level;Squat;Stairs;Stand    Examination-Participation Restrictions Cleaning;Community Activity    Stability/Clinical Decision Making Evolving/Moderate complexity    Clinical Decision Making Moderate    Rehab Potential Good    PT Frequency 2x / week    PT Duration 6 weeks    PT Treatment/Interventions ADLs/Self Care Home Management;Aquatic Therapy;Electrical Stimulation;Cryotherapy;Iontophoresis 4mg /ml Dexamethasone;Moist Heat;Ultrasound;Gait training;Stair training;Functional mobility training;Therapeutic activities;Therapeutic exercise;Balance training;Neuromuscular re-education;Patient/family education;Manual techniques;Dry needling;Energy conservation;Taping;Vasopneumatic Device    PT Next Visit Plan Continue  to work on bilat LE strength, ankle/foot intrinsics/control, and balance. Consider Berg assessment    PT Home Exercise Plan Access Code: [email or web address]    Consulted and Agree with Plan of Care Patient;Family member/caregiver           Patient will benefit from skilled therapeutic intervention in order to improve the following deficits and impairments:  Difficulty walking, Cardiopulmonary status limiting activity, Decreased endurance, Decreased activity tolerance, Pain, Decreased balance, Decreased mobility, Decreased strength, Increased edema  Visit Diagnosis: Difficulty in walking, not elsewhere classified  Muscle weakness (generalized)  Chronic pain of right knee  Other abnormalities of gait and mobility     Problem List Patient Active Problem List   Diagnosis Date Noted  . Shortness of breath   . Pneumonitis 07/14/2019  . Pressure injury of skin 07/10/2019  . HCAP (healthcare-associated pneumonia)   . Chronic respiratory failure with hypoxia ([place]) 07/03/2019  . Atypical atrial flutter ([place])   . Normocytic anemia 05/18/2017  . Chronic ITP (idiopathic thrombocytopenia) ([facility]) 01/25/2017  . Chronic kidney disease (CKD) stage G4/A1, severely decreased glomerular filtration rate (GFR) between 15-29 mL/min/1.73 square meter and albuminuria creatinine ratio less than 30 mg/g (H[facility]) 09/04/2015  . Chronic kidney disease 09/04/2015  . Respiratory failure ([place]) 08/24/2015  . GOUT 06/23/2010  .  DERMATITIS 01/16/2010  . BACK PAIN 06/13/2009  . MYALGIA 06/13/2009  . Anxiety state 12/13/2008  . LEG EDEMA, CHRONIC 11/08/2008  . Sleep apnea 10/26/2008  . Hypothyroidism 09/27/2008  . HYPERCHOLESTEROLEMIA 09/27/2008  . OBESITY 09/27/2008  . DEPRESSION 09/27/2008  . Essential hypertension 09/27/2008  . ALLERGIC RHINITIS 09/27/2008  . ASTHMA, CHILDHOOD 09/27/2008    [facility] [address] PT, DPT 03/04/2020, 2:10 PM  [facility] [address] [place], [place], [address] Phone: [phone]   Fax:  [phone]  Name: [name] MRN: [id number] Date of Birth: 1935/01/26

## 2020-03-04 NOTE — Patient Instructions (Signed)
Access Code: [email or web address] URL: [email or web address] Date: 03/04/2020 Prepared by: Estill Bamberg April Thurnell Garbe  Exercises Towel Scrunches - 3 x daily - 7 x weekly - 1 sets - 10 reps Ankle Inversion Eversion Towel Slide - 3 x daily - 7 x weekly - 1 sets - 10 reps Long Sitting Ankle Plantar Flexion with Resistance - 3 x daily - 7 x weekly - 1 sets - 10 reps Hip Extension with Resistance Loop - 3 x daily - 7 x weekly - 1 sets - 10 reps Hip Abduction with Resistance Loop - 3 x daily - 7 x weekly - 1 sets - 10 reps

## 2020-03-12 ENCOUNTER — Other Ambulatory Visit: Payer: Self-pay

## 2020-03-12 ENCOUNTER — Inpatient Hospital Stay: Payer: Medicare Other

## 2020-03-12 ENCOUNTER — Ambulatory Visit: Payer: Medicare Other | Attending: Orthopaedic Surgery | Admitting: Physical Therapy

## 2020-03-12 ENCOUNTER — Encounter: Payer: Self-pay | Admitting: Physical Therapy

## 2020-03-12 ENCOUNTER — Inpatient Hospital Stay: Payer: Medicare Other | Attending: Hematology & Oncology

## 2020-03-12 DIAGNOSIS — Z79899 Other long term (current) drug therapy: Secondary | ICD-10-CM | POA: Diagnosis not present

## 2020-03-12 DIAGNOSIS — M6281 Muscle weakness (generalized): Secondary | ICD-10-CM | POA: Diagnosis present

## 2020-03-12 DIAGNOSIS — I4891 Unspecified atrial fibrillation: Secondary | ICD-10-CM | POA: Diagnosis not present

## 2020-03-12 DIAGNOSIS — D509 Iron deficiency anemia, unspecified: Secondary | ICD-10-CM | POA: Insufficient documentation

## 2020-03-12 DIAGNOSIS — Z7901 Long term (current) use of anticoagulants: Secondary | ICD-10-CM | POA: Insufficient documentation

## 2020-03-12 DIAGNOSIS — R2689 Other abnormalities of gait and mobility: Secondary | ICD-10-CM

## 2020-03-12 DIAGNOSIS — R262 Difficulty in walking, not elsewhere classified: Secondary | ICD-10-CM

## 2020-03-12 DIAGNOSIS — Z86711 Personal history of pulmonary embolism: Secondary | ICD-10-CM | POA: Insufficient documentation

## 2020-03-12 DIAGNOSIS — M25561 Pain in right knee: Secondary | ICD-10-CM | POA: Insufficient documentation

## 2020-03-12 DIAGNOSIS — D693 Immune thrombocytopenic purpura: Secondary | ICD-10-CM | POA: Insufficient documentation

## 2020-03-12 DIAGNOSIS — G8929 Other chronic pain: Secondary | ICD-10-CM | POA: Diagnosis present

## 2020-03-12 LAB — CBC WITH DIFFERENTIAL (CANCER CENTER ONLY)
Abs Immature Granulocytes: 0.03 10*3/uL (ref 0.00–0.07)
Basophils Absolute: 0 10*3/uL (ref 0.0–0.1)
Basophils Relative: 0 %
Eosinophils Absolute: 0.3 10*3/uL (ref 0.0–0.5)
Eosinophils Relative: 4 %
HCT: 33.7 % — ABNORMAL LOW (ref 36.0–46.0)
Hemoglobin: 11.1 g/dL — ABNORMAL LOW (ref 12.0–15.0)
Immature Granulocytes: 0 %
Lymphocytes Relative: 21 %
Lymphs Abs: 1.5 10*3/uL (ref 0.7–4.0)
MCH: 31.9 pg (ref 26.0–34.0)
MCHC: 32.9 g/dL (ref 30.0–36.0)
MCV: 96.8 fL (ref 80.0–100.0)
Monocytes Absolute: 0.4 10*3/uL (ref 0.1–1.0)
Monocytes Relative: 6 %
Neutro Abs: 4.7 10*3/uL (ref 1.7–7.7)
Neutrophils Relative %: 69 %
Platelet Count: 156 10*3/uL (ref 150–400)
RBC: 3.48 MIL/uL — ABNORMAL LOW (ref 3.87–5.11)
RDW: 15.9 % — ABNORMAL HIGH (ref 11.5–15.5)
WBC Count: 6.9 10*3/uL (ref 4.0–10.5)
nRBC: 0 % (ref 0.0–0.2)

## 2020-03-12 NOTE — Therapy (Addendum)
[place], [place], [address] Phone: [phone]   Fax:  [phone]  Physical Therapy Treatment  Patient Details  Name: [name] MRN: [id number] Date of Birth: 08/09/1935 Referring Provider (PT): Mcarthur Rossetti, MD   Encounter Date: 03/12/2020   PT End of Session - 03/12/20 1026    Visit Number 2    Number of Visits 12    Authorization Type MCR -- needs 10th visit progress note    Progress Note Due on Visit 10    PT Start Time 0951    PT Stop Time 1030    PT Time Calculation (min) 39 min    Activity Tolerance Patient tolerated treatment well    Behavior During Therapy [facility] for tasks assessed/performed           Past Medical History:  Diagnosis Date  . Allergic rhinitis   . Anxiety state, unspecified    panic attacks  . CHF (congestive heart failure) ([place])   . Depressive disorder, not elsewhere classified   . Extrinsic asthma, unspecified    no problem since adulthood  . Obesity   . OSA on CPAP    severe  . Pure hypercholesterolemia   . Respiratory failure with hypoxia ([place]) 09/2008   acute, secondary to multiple bilateral pulmonary embolism , negative hypercoagulable workup 09/2008 hospital stay  . Scoliosis   . Type II or unspecified type diabetes mellitus without mention of complication, not stated as uncontrolled   . Unspecified essential hypertension   . Unspecified hypothyroidism    hypo    Past Surgical History:  Procedure Laterality Date  . CARDIOVERSION N/A 06/26/2019   Procedure: CARDIOVERSION;  Surgeon: Jerline Pain, MD;  Location: [facility] ENDOSCOPY;  Service: Cardiovascular;  Laterality: N/A;  . EYE SURGERY    . JOINT REPLACEMENT    . knee replaced    . THYROID SURGERY      There were no vitals filed for this visit.   Subjective Assessment - 03/12/20 0955    Subjective Pt arriving to therpay reporting SOB and no pain today. Pt's O2 sats were taken at 93% on  RA. Pt was instructed to take deep breaths and O2 sats increased back to 96% on RA.    Limitations Walking;Lifting;House hold activities    How long can you walk comfortably? Needs rest breaks; limited due to respiratory status    Patient Stated Goals Return back to PLOF; less reliant on rollator    Currently in Pain? No/denies              [facility] PT Assessment - 03/12/20 0001      Ambulation/Gait   Ambulation Distance (Feet) 50 Feet    Assistive device Rollator    Gait Pattern Step-through pattern    Ambulation Surface Level      Balance   Balance Assessed Yes      Standardized Balance Assessment   Standardized Balance Assessment Berg Balance Test      Berg Balance Test   Sit to Stand Able to stand without using hands and stabilize independently    Standing Unsupported Able to stand safely 2 minutes    Sitting with Back Unsupported but Feet Supported on Floor or Stool Able to sit safely and securely 2 minutes    Stand to Sit Uses backs of legs against chair to control descent    Transfers Able to transfer safely, definite need of hands    Standing Unsupported with  Eyes Closed Able to stand 10 seconds with supervision    Standing Unsupported with Feet Together Able to place feet together independently but unable to hold for 30 seconds    From Standing, Reach Forward with Outstretched Arm Can reach forward >5 cm safely (2")    From Standing Position, Pick up Object from Floor Unable to pick up shoe, but reaches 2-5 cm (1-2") from shoe and balances independently    From Standing Position, Turn to Look Behind Over each Shoulder Turn sideways only but maintains balance    Turn 360 Degrees Needs assistance while turning    Standing Unsupported, Alternately Place Feet on Step/Stool Needs assistance to keep from falling or unable to try    Standing Unsupported, One Foot in [place] help to step but can hold 15 seconds    Standing on One Leg Unable to try or needs assist to prevent  fall    Total Score 29                         [facility] Adult PT Treatment/Exercise - 03/12/20 0001      Exercises   Exercises Knee/Hip;Ankle      Knee/Hip Exercises: Standing   Heel Raises 10 reps    Hip Abduction Stengthening;Both;10 reps    Abduction Limitations red t-band    Hip Extension Stengthening;Both;10 reps    Extension Limitations red t-band      Knee/Hip Exercises: Seated   Hamstring Curl AROM;Strengthening;Right;15 reps;Limitations    Hamstring Limitations started off with heel slides on towel, progressed to red theraband    Sit to Sand 5 reps;with UE support      Ankle Exercises: Seated   Towel Inversion/Eversion 5 reps;Limitations    Heel Raises Both;15 reps    Toe Raise 15 reps                  PT Education - 03/12/20 1051    Education Details Discussed results of BERG balance assessment and fall risks/precautions    Person(s) Educated Patient    Methods Explanation    Comprehension Verbalized understanding            PT Short Term Goals - 03/12/20 1052      PT SHORT TERM GOAL #1   Title be independent in initial HEP    Baseline reviwed    Status On-going      PT SHORT TERM GOAL #2   Title walk for 4-5 min without fatigue to assist with community ambulation with rollator    Baseline Pt with SOB noted after walking from lobby to gym    Time 3    Period Weeks    Status On-going      PT SHORT TERM GOAL #3   Title -    Baseline -      PT SHORT TERM GOAL #4   Title -    Baseline -             PT Long Term Goals - 03/12/20 1034      PT LONG TERM GOAL #1   Title be independent in advanced HEP    Status On-going      PT LONG TERM GOAL #2   Title Pt will improve 5 times sit<>stand to <13 sec for decreased fall risk    Status New      PT LONG TERM GOAL #3   Title Pt will be able to amb with normal  reciprocal gait pattern without a/d for ~50'    Status New      PT LONG TERM GOAL #4   Title Pt will demonstrate at  least 4+/5 bilat LE strength bilat    Status New      PT LONG TERM GOAL #5   Title Pt will have improved FOTO score to 31% impairment    Status New      PT LONG TERM GOAL #6   Title Pt will be able to amb x 8-10 min with rollator MI without rest break to assist with community ambulation    Baseline 29 sec    Status On-going                 Plan - 03/12/20 1027    Clinical Impression Statement Pt arriving to therapy reporting no pain today. Pt reporting being fatigued from doing her HEP this morning. BERG balance test performed with a scor eof 29/56. Pt was instructed in precautions and fall risks. Pt complained of SOB during sesssions with rest breaks noted. O2 sats monitered during session with sats ranging from 90-96% on room air. Continue to progress toward improved functional mobility.    Personal Factors and Comorbidities Age;Comorbidity 1;Comorbidity 2;Comorbidity 3+    Comorbidities osteoporosis, chronic R knee OA/pain, diabetes with neuropathy    Examination-Activity Limitations Caring for Others;Carry;Lift;Locomotion Level;Squat;Stairs;Stand    Examination-Participation Restrictions Cleaning;Community Activity    Stability/Clinical Decision Making Evolving/Moderate complexity    Rehab Potential Good    PT Frequency 2x / week    PT Treatment/Interventions ADLs/Self Care Home Management;Aquatic Therapy;Electrical Stimulation;Cryotherapy;Iontophoresis 4mg /ml Dexamethasone;Moist Heat;Ultrasound;Gait training;Stair training;Functional mobility training;Therapeutic activities;Therapeutic exercise;Balance training;Neuromuscular re-education;Patient/family education;Manual techniques;Dry needling;Energy conservation;Taping;Vasopneumatic Device    PT Next Visit Plan Pt wants to try the Nustep at next visit.   Continue to work on bilat LE strength, ankle/foot intrinsics/control, and balance.    PT Home Exercise Plan Access Code: [email or web address]    Consulted and Agree with Plan of Care  Patient           Patient will benefit from skilled therapeutic intervention in order to improve the following deficits and impairments:  Difficulty walking, Cardiopulmonary status limiting activity, Decreased endurance, Decreased activity tolerance, Pain, Decreased balance, Decreased mobility, Decreased strength, Increased edema  Visit Diagnosis: Difficulty in walking, not elsewhere classified  Muscle weakness (generalized)  Chronic pain of right knee  Other abnormalities of gait and mobility     Problem List Patient Active Problem List   Diagnosis Date Noted  . Shortness of breath   . Pneumonitis 07/14/2019  . Pressure injury of skin 07/10/2019  . HCAP (healthcare-associated pneumonia)   . Chronic respiratory failure with hypoxia ([place]) 07/03/2019  . Atypical atrial flutter ([place])   . Normocytic anemia 05/18/2017  . Chronic ITP (idiopathic thrombocytopenia) ([facility]) 01/25/2017  . Chronic kidney disease (CKD) stage G4/A1, severely decreased glomerular filtration rate (GFR) between 15-29 mL/min/1.73 square meter and albuminuria creatinine ratio less than 30 mg/g (HCC) 09/04/2015  . Chronic kidney disease 09/04/2015  . Respiratory failure ([place]) 08/24/2015  . GOUT 06/23/2010  . DERMATITIS 01/16/2010  . BACK PAIN 06/13/2009  . MYALGIA 06/13/2009  . Anxiety state 12/13/2008  . LEG EDEMA, CHRONIC 11/08/2008  . Sleep apnea 10/26/2008  . Hypothyroidism 09/27/2008  . HYPERCHOLESTEROLEMIA 09/27/2008  . OBESITY 09/27/2008  . DEPRESSION 09/27/2008  . Essential hypertension 09/27/2008  . ALLERGIC RHINITIS 09/27/2008  . ASTHMA, CHILDHOOD 09/27/2008    Oretha Caprice, PT, MPT 03/12/2020, 10:56 AM  [facility] [address] [place], [place], [address] Phone: [phone]   Fax:  [phone]  Name: [name] MRN: [id number] Date of Birth: Feb 17, 1935

## 2020-03-12 NOTE — Progress Notes (Signed)
No Nplate today per patient parameters. dph

## 2020-03-15 ENCOUNTER — Ambulatory Visit: Payer: Medicare Other

## 2020-03-15 ENCOUNTER — Other Ambulatory Visit: Payer: Self-pay

## 2020-03-15 DIAGNOSIS — R262 Difficulty in walking, not elsewhere classified: Secondary | ICD-10-CM | POA: Diagnosis not present

## 2020-03-15 DIAGNOSIS — M6281 Muscle weakness (generalized): Secondary | ICD-10-CM

## 2020-03-15 DIAGNOSIS — G8929 Other chronic pain: Secondary | ICD-10-CM

## 2020-03-15 DIAGNOSIS — R2689 Other abnormalities of gait and mobility: Secondary | ICD-10-CM

## 2020-03-17 NOTE — Therapy (Signed)
[place], [place], [address] Phone: [phone]   Fax:  [phone]  Physical Therapy Treatment  Patient Details  Name: [name] MRN: [id number] Date of Birth: Oct 25, 1934 Referring Provider (PT): Mcarthur Rossetti, MD   Encounter Date: 03/15/2020   PT End of Session - 03/17/20 1435    Visit Number 3    Number of Visits 12    Authorization Type MCR -- needs 10th visit progress note    Progress Note Due on Visit 10    PT Start Time 1216    PT Stop Time 1307    PT Time Calculation (min) 51 min    Activity Tolerance Patient tolerated treatment well    Behavior During Therapy [facility] for tasks assessed/performed           Past Medical History:  Diagnosis Date  . Allergic rhinitis   . Anxiety state, unspecified    panic attacks  . CHF (congestive heart failure) ([place])   . Depressive disorder, not elsewhere classified   . Extrinsic asthma, unspecified    no problem since adulthood  . Obesity   . OSA on CPAP    severe  . Pure hypercholesterolemia   . Respiratory failure with hypoxia ([place]) 09/2008   acute, secondary to multiple bilateral pulmonary embolism , negative hypercoagulable workup 09/2008 hospital stay  . Scoliosis   . Type II or unspecified type diabetes mellitus without mention of complication, not stated as uncontrolled   . Unspecified essential hypertension   . Unspecified hypothyroidism    hypo    Past Surgical History:  Procedure Laterality Date  . CARDIOVERSION N/A 06/26/2019   Procedure: CARDIOVERSION;  Surgeon: Jerline Pain, MD;  Location: [facility] ENDOSCOPY;  Service: Cardiovascular;  Laterality: N/A;  . EYE SURGERY    . JOINT REPLACEMENT    . knee replaced    . THYROID SURGERY      There were no vitals filed for this visit.   Subjective Assessment - 03/17/20 1434    Subjective Pt reports she is not having pain today. Pt states her ability to walking and endurance are  slowly improving every week.    Limitations Walking;Lifting;House hold activities    How long can you walk comfortably? Needs rest breaks; limited due to respiratory status    Patient Stated Goals Return back to PLOF; less reliant on rollator                             [facility] Adult PT Treatment/Exercise - 03/17/20 0001      Ambulation/Gait   Ambulation Distance (Feet) 100 Feet    Assistive device Rollator    Gait Pattern Step-through pattern    Ambulation Surface Level      Self-Care   Self-Care Other Self-Care Comments    Other Self-Care Comments  Use of Tband with standing LE exs; review and use of pursed lip breathing as needed c DOE c Exs.      Exercises   Exercises Knee/Hip;Ankle      Knee/Hip Exercises: Aerobic   Nustep 8 min; L3; UE/LE      Knee/Hip Exercises: Standing   Heel Raises Both;10 reps    Heel Raises Limitations c toe lifts    Knee Flexion Strengthening;Right;Left;10 reps    Knee Flexion Limitations red tband    Hip Flexion Stengthening;Right;Left;10 reps    Hip Flexion Limitations red tband  Hip Abduction Stengthening;Both;10 reps    Abduction Limitations red t-band    Hip Extension Stengthening;Both;10 reps    Extension Limitations red t-band      Knee/Hip Exercises: Seated   Long Arc Quad Strengthening;Right;Left;2 sets;10 reps    Sit to [employer] 5 reps;with UE support                  PT Education - 03/17/20 1448    Education Details Use of Tband with standing LE exs; review and use of pursed lip breathing as needed c DOE c Exs            PT Short Term Goals - 03/12/20 1052      PT SHORT TERM GOAL #1   Title be independent in initial HEP    Baseline reviwed    Status On-going      PT SHORT TERM GOAL #2   Title walk for 4-5 min without fatigue to assist with community ambulation with rollator    Baseline Pt with SOB noted after walking from lobby to gym    Time 3    Period Weeks    Status On-going      PT  SHORT TERM GOAL #3   Title -    Baseline -      PT SHORT TERM GOAL #4   Title -    Baseline -             PT Long Term Goals - 03/12/20 1034      PT LONG TERM GOAL #1   Title be independent in advanced HEP    Status On-going      PT LONG TERM GOAL #2   Title Pt will improve 5 times sit<>stand to <13 sec for decreased fall risk    Status New      PT LONG TERM GOAL #3   Title Pt will be able to amb with normal reciprocal gait pattern without a/d for ~50'    Status New      PT LONG TERM GOAL #4   Title Pt will demonstrate at least 4+/5 bilat LE strength bilat    Status New      PT LONG TERM GOAL #5   Title Pt will have improved FOTO score to 31% impairment    Status New      PT LONG TERM GOAL #6   Title Pt will be able to amb x 8-10 min with rollator MI without rest break to assist with community ambulation    Baseline 29 sec    Status On-going                 Plan - 03/17/20 1500    Clinical Impression Statement Pre-NuStep vitals O2 sat 97%, HR 63; Post-NuStep vital O2 sat 90%, HR 66. Reviewed and pt completed pursed lip breathing for recovery. Pt participated appropriately in the PT session needing several breaks due to DOE or fatigue/weakness. Pt reports she is completing her exs consistently at home. Pt will continue to benefit from PT to address strength and activity tolerance to improve functional mobility.    Personal Factors and Comorbidities Age;Comorbidity 1;Comorbidity 2;Comorbidity 3+    Comorbidities osteoporosis, chronic R knee OA/pain, diabetes with neuropathy    Examination-Activity Limitations Caring for Others;Carry;Lift;Locomotion Level;Squat;Stairs;Stand    Examination-Participation Restrictions Cleaning;Community Activity    Stability/Clinical Decision Making Evolving/Moderate complexity    Clinical Decision Making Moderate    Rehab Potential Good    PT Frequency 2x /  week    PT Duration 6 weeks    PT Treatment/Interventions ADLs/Self Care  Home Management;Aquatic Therapy;Electrical Stimulation;Cryotherapy;Iontophoresis 4mg /ml Dexamethasone;Moist Heat;Ultrasound;Gait training;Stair training;Functional mobility training;Therapeutic activities;Therapeutic exercise;Balance training;Neuromuscular re-education;Patient/family education;Manual techniques;Dry needling;Energy conservation;Taping;Vasopneumatic Device    PT Next Visit Plan Asses pt's repsonse to NuStep and ther ex for strengthening.    PT Home Exercise Plan Access Code: [email or web address]    Consulted and Agree with Plan of Care Patient           Patient will benefit from skilled therapeutic intervention in order to improve the following deficits and impairments:  Difficulty walking, Cardiopulmonary status limiting activity, Decreased endurance, Decreased activity tolerance, Pain, Decreased balance, Decreased mobility, Decreased strength, Increased edema  Visit Diagnosis: Difficulty in walking, not elsewhere classified  Muscle weakness (generalized)  Chronic pain of right knee  Other abnormalities of gait and mobility     Problem List Patient Active Problem List   Diagnosis Date Noted  . Shortness of breath   . Pneumonitis 07/14/2019  . Pressure injury of skin 07/10/2019  . HCAP (healthcare-associated pneumonia)   . Chronic respiratory failure with hypoxia ([place]) 07/03/2019  . Atypical atrial flutter ([place])   . Normocytic anemia 05/18/2017  . Chronic ITP (idiopathic thrombocytopenia) (HCC) 01/25/2017  . Chronic kidney disease (CKD) stage G4/A1, severely decreased glomerular filtration rate (GFR) between 15-29 mL/min/1.73 square meter and albuminuria creatinine ratio less than 30 mg/g (HCC) 09/04/2015  . Chronic kidney disease 09/04/2015  . Respiratory failure ([place]) 08/24/2015  . GOUT 06/23/2010  . DERMATITIS 01/16/2010  . BACK PAIN 06/13/2009  . MYALGIA 06/13/2009  . Anxiety state 12/13/2008  . LEG EDEMA, CHRONIC 11/08/2008  . Sleep apnea 10/26/2008  .  Hypothyroidism 09/27/2008  . HYPERCHOLESTEROLEMIA 09/27/2008  . OBESITY 09/27/2008  . DEPRESSION 09/27/2008  . Essential hypertension 09/27/2008  . ALLERGIC RHINITIS 09/27/2008  . ASTHMA, CHILDHOOD 09/27/2008   Gar Ponto MS, PT 03/17/20 3:40 PM  [facility] [address] [place], [place], [address] Phone: [phone]   Fax:  [phone]  Name: SULEIKA DONAVAN MRN: [id number] Date of Birth: [birth year]

## 2020-03-19 ENCOUNTER — Ambulatory Visit: Payer: Medicare Other

## 2020-03-19 ENCOUNTER — Other Ambulatory Visit: Payer: Self-pay

## 2020-03-19 ENCOUNTER — Ambulatory Visit: Payer: Medicare Other | Admitting: Physical Therapy

## 2020-03-19 DIAGNOSIS — G8929 Other chronic pain: Secondary | ICD-10-CM

## 2020-03-19 DIAGNOSIS — M6281 Muscle weakness (generalized): Secondary | ICD-10-CM

## 2020-03-19 DIAGNOSIS — R262 Difficulty in walking, not elsewhere classified: Secondary | ICD-10-CM

## 2020-03-19 DIAGNOSIS — R2689 Other abnormalities of gait and mobility: Secondary | ICD-10-CM

## 2020-03-20 NOTE — Therapy (Signed)
[place], [place], [address] Phone: [phone]   Fax:  [phone]  Physical Therapy Treatment  Patient Details  Name: [name] MRN: [id number] Date of Birth: November 28, 1934 Referring Provider (PT): Mcarthur Rossetti, MD   Encounter Date: 03/19/2020   PT End of Session - 03/20/20 0631    Visit Number 4    Number of Visits 12    Authorization Type MCR -- needs 10th visit progress note    Progress Note Due on Visit 10    PT Start Time 1453   pt was 8 mins late   PT Stop Time 1534    PT Time Calculation (min) 41 min    Activity Tolerance Patient tolerated treatment well    Behavior During Therapy [facility] for tasks assessed/performed           Past Medical History:  Diagnosis Date  . Allergic rhinitis   . Anxiety state, unspecified    panic attacks  . CHF (congestive heart failure) ([place])   . Depressive disorder, not elsewhere classified   . Extrinsic asthma, unspecified    no problem since adulthood  . Obesity   . OSA on CPAP    severe  . Pure hypercholesterolemia   . Respiratory failure with hypoxia ([place]) 09/2008   acute, secondary to multiple bilateral pulmonary embolism , negative hypercoagulable workup 09/2008 hospital stay  . Scoliosis   . Type II or unspecified type diabetes mellitus without mention of complication, not stated as uncontrolled   . Unspecified essential hypertension   . Unspecified hypothyroidism    hypo    Past Surgical History:  Procedure Laterality Date  . CARDIOVERSION N/A 06/26/2019   Procedure: CARDIOVERSION;  Surgeon: Jerline Pain, MD;  Location: [facility] ENDOSCOPY;  Service: Cardiovascular;  Laterality: N/A;  . EYE SURGERY    . JOINT REPLACEMENT    . knee replaced    . THYROID SURGERY      There were no vitals filed for this visit.   Subjective Assessment - 03/19/20 1526    Subjective Pt reports she is tried today following a family visit the past few  days. R knee 0/10 at beginning of session, 5/10 c exercise.    Limitations Walking;Lifting;House hold activities    How long can you walk comfortably? Needs rest breaks; limited due to respiratory status    Patient Stated Goals Return back to PLOF; less reliant on rollator    Currently in Pain? Yes    Pain Score 5     Pain Location Knee    Pain Orientation Right    Pain Descriptors / Indicators Aching    Pain Type Chronic pain    Pain Onset More than a month ago    Pain Frequency Occasional    Aggravating Factors  R LE exs    Pain Relieving Factors Rest    Effect of Pain on Daily Activities Tops to rest if needed                             [facility] Adult PT Treatment/Exercise - 03/20/20 0001      Ambulation/Gait   Ambulation Distance (Feet) 150 Feet   x2   Assistive device Rollator    Gait Pattern Step-through pattern    Ambulation Surface Level      Self-Care   Self-Care Other Self-Care Comments    Other Self-Care Comments  RPE: assessing RPE  by modified Borg scale, 0-10, and by maintaining conversation s interverence by DOE.      Exercises   Exercises Knee/Hip;Ankle      Knee/Hip Exercises: Aerobic   Nustep 10 mins; L3; UE/LE. Initially O2 97%, HR 62; After 5 mins SPM 40-50 O2 sat 94%, HR 64, modified RPE 3/10; after 10 mins c SPM 60-70, O2 sat 97%, HR 60, RPE 4/10.      Knee/Hip Exercises: Seated   Long Arc Quad Strengthening;Right;Left;2 sets;10 reps    Marching Strengthening;Right;Left;2 sets;10 reps   Crepitus R knee   Sit to [place] 5 reps;with UE support                  PT Education - 03/20/20 2202    Education Details RPE: assessing RPE by modified Borg scale, 0-10, and by maintaining conversation s interverence by DOE.    Person(s) Educated Patient    Methods Explanation    Comprehension Verbalized understanding;Returned demonstration            PT Short Term Goals - 03/12/20 1052      PT SHORT TERM GOAL #1   Title be independent  in initial HEP    Baseline reviwed    Status On-going      PT SHORT TERM GOAL #2   Title walk for 4-5 min without fatigue to assist with community ambulation with rollator    Baseline Pt with SOB noted after walking from lobby to gym    Time 3    Period Weeks    Status On-going      PT SHORT TERM GOAL #3   Title -    Baseline -      PT SHORT TERM GOAL #4   Title -    Baseline -             PT Long Term Goals - 03/12/20 1034      PT LONG TERM GOAL #1   Title be independent in advanced HEP    Status On-going      PT LONG TERM GOAL #2   Title Pt will improve 5 times sit<>stand to <13 sec for decreased fall risk    Status New      PT LONG TERM GOAL #3   Title Pt will be able to amb with normal reciprocal gait pattern without a/d for ~50'    Status New      PT LONG TERM GOAL #4   Title Pt will demonstrate at least 4+/5 bilat LE strength bilat    Status New      PT LONG TERM GOAL #5   Title Pt will have improved FOTO score to 31% impairment    Status New      PT LONG TERM GOAL #6   Title Pt will be able to amb x 8-10 min with rollator MI without rest break to assist with community ambulation    Baseline 29 sec    Status On-going                 Plan - 03/20/20 5427    Clinical Impression Statement Pt needed rest breaks following ambualtion within the clinic. Pt was able to increase her steps per minute on the NuStep from 40-50 SPM to 60-70 SPM s decline in O2 sat. With sitting LAQ, pt reports clicking with her R knee and an increase in pain to a 5/10. With palpation it appered to be in the area of the lateral  patella. Pt will benfit from continued PT for strengthening and endurance to imrove functional mobility and safety.    Personal Factors and Comorbidities Age;Comorbidity 1;Comorbidity 2;Comorbidity 3+    Comorbidities osteoporosis, chronic R knee OA/pain, diabetes with neuropathy    Examination-Activity Limitations Caring for Others;Carry;Lift;Locomotion  Level;Squat;Stairs;Stand    Examination-Participation Restrictions Cleaning;Community Activity    Stability/Clinical Decision Making Evolving/Moderate complexity    Clinical Decision Making Moderate    Rehab Potential Good    PT Frequency 2x / week    PT Treatment/Interventions ADLs/Self Care Home Management;Aquatic Therapy;Electrical Stimulation;Cryotherapy;Iontophoresis 4mg /ml Dexamethasone;Moist Heat;Ultrasound;Gait training;Stair training;Functional mobility training;Therapeutic activities;Therapeutic exercise;Balance training;Neuromuscular re-education;Patient/family education;Manual techniques;Dry needling;Energy conservation;Taping;Vasopneumatic Device    PT Next Visit Plan Assess for PF issue and treat as indicated    PT Home Exercise Plan Access Code: [email or web address]    Consulted and Agree with Plan of Care Patient           Patient will benefit from skilled therapeutic intervention in order to improve the following deficits and impairments:  Difficulty walking, Cardiopulmonary status limiting activity, Decreased endurance, Decreased activity tolerance, Pain, Decreased balance, Decreased mobility, Decreased strength, Increased edema  Visit Diagnosis: Difficulty in walking, not elsewhere classified  Muscle weakness (generalized)  Chronic pain of right knee  Other abnormalities of gait and mobility     Problem List Patient Active Problem List   Diagnosis Date Noted  . Shortness of breath   . Pneumonitis 07/14/2019  . Pressure injury of skin 07/10/2019  . HCAP (healthcare-associated pneumonia)   . Chronic respiratory failure with hypoxia ([place]) 07/03/2019  . Atypical atrial flutter ([place])   . Normocytic anemia 05/18/2017  . Chronic ITP (idiopathic thrombocytopenia) (HCC) 01/25/2017  . Chronic kidney disease (CKD) stage G4/A1, severely decreased glomerular filtration rate (GFR) between 15-29 mL/min/1.73 square meter and albuminuria creatinine ratio less than 30 mg/g (HCC)  09/04/2015  . Chronic kidney disease 09/04/2015  . Respiratory failure ([place]) 08/24/2015  . GOUT 06/23/2010  . DERMATITIS 01/16/2010  . BACK PAIN 06/13/2009  . MYALGIA 06/13/2009  . Anxiety state 12/13/2008  . LEG EDEMA, CHRONIC 11/08/2008  . Sleep apnea 10/26/2008  . Hypothyroidism 09/27/2008  . HYPERCHOLESTEROLEMIA 09/27/2008  . OBESITY 09/27/2008  . DEPRESSION 09/27/2008  . Essential hypertension 09/27/2008  . ALLERGIC RHINITIS 09/27/2008  . ASTHMA, CHILDHOOD 09/27/2008    Gar Ponto MS, PT 03/20/20 6:50 AM  [facility] [address] [place], [place], [address] Phone: [phone]   Fax:  [phone]  Name: CEAIRRA MCCARVER MRN: [id number] Date of Birth: 1935-04-08

## 2020-03-21 ENCOUNTER — Other Ambulatory Visit: Payer: Self-pay

## 2020-03-21 ENCOUNTER — Ambulatory Visit: Payer: Medicare Other | Admitting: Physical Therapy

## 2020-03-21 DIAGNOSIS — M6281 Muscle weakness (generalized): Secondary | ICD-10-CM

## 2020-03-21 DIAGNOSIS — R2689 Other abnormalities of gait and mobility: Secondary | ICD-10-CM

## 2020-03-21 DIAGNOSIS — R262 Difficulty in walking, not elsewhere classified: Secondary | ICD-10-CM | POA: Diagnosis not present

## 2020-03-21 DIAGNOSIS — G8929 Other chronic pain: Secondary | ICD-10-CM

## 2020-03-21 NOTE — Therapy (Signed)
[place], [place], [address] Phone: [phone]   Fax:  [phone]  Physical Therapy Treatment  Patient Details  Name: [name] MRN: [id number] Date of Birth: Jan 03, 1935 Referring Provider (PT): Mcarthur Rossetti, MD   Encounter Date: 03/21/2020   PT End of Session - 03/21/20 1536    Visit Number 5    Number of Visits 12    Authorization Type MCR -- needs 10th visit progress note    Progress Note Due on Visit 10    PT Start Time 1450    PT Stop Time 1535    PT Time Calculation (min) 45 min    Activity Tolerance Patient tolerated treatment well    Behavior During Therapy [facility] for tasks assessed/performed           Past Medical History:  Diagnosis Date  . Allergic rhinitis   . Anxiety state, unspecified    panic attacks  . CHF (congestive heart failure) ([place])   . Depressive disorder, not elsewhere classified   . Extrinsic asthma, unspecified    no problem since adulthood  . Obesity   . OSA on CPAP    severe  . Pure hypercholesterolemia   . Respiratory failure with hypoxia ([place]) 09/2008   acute, secondary to multiple bilateral pulmonary embolism , negative hypercoagulable workup 09/2008 hospital stay  . Scoliosis   . Type II or unspecified type diabetes mellitus without mention of complication, not stated as uncontrolled   . Unspecified essential hypertension   . Unspecified hypothyroidism    hypo    Past Surgical History:  Procedure Laterality Date  . CARDIOVERSION N/A 06/26/2019   Procedure: CARDIOVERSION;  Surgeon: Jerline Pain, MD;  Location: [facility] ENDOSCOPY;  Service: Cardiovascular;  Laterality: N/A;  . EYE SURGERY    . JOINT REPLACEMENT    . knee replaced    . THYROID SURGERY      There were no vitals filed for this visit.                      [place] Adult PT Treatment/Exercise - 03/21/20 0001      Knee/Hip Exercises: Aerobic   Nustep L3 x 7 min  with UE/LE      Knee/Hip Exercises: Standing   Other Standing Knee Exercises tandem stance 2 x 10 sec, 2 x 5 sec    Other Standing Knee Exercises feet together with head turns x 10 reps      Knee/Hip Exercises: Seated   Other Seated Knee/Hip Exercises Heel raise with 5lb weight resting on knee x 10 reps    Marching Strengthening;Right;Left;2 sets;10 reps      Ankle Exercises: Seated   Towel Crunch --   2 x 10 reps   Towel Inversion/Eversion --   2 x 10 reps   [place] Pickup x 5 min                    PT Short Term Goals - 03/12/20 1052      PT SHORT TERM GOAL #1   Title be independent in initial HEP    Baseline reviwed    Status On-going      PT SHORT TERM GOAL #2   Title walk for 4-5 min without fatigue to assist with community ambulation with rollator    Baseline Pt with SOB noted after walking from lobby to gym    Time 3    Period Weeks  Status On-going      PT SHORT TERM GOAL #3   Title -    Baseline -      PT SHORT TERM GOAL #4   Title -    Baseline -             PT Long Term Goals - 03/12/20 1034      PT LONG TERM GOAL #1   Title be independent in advanced HEP    Status On-going      PT LONG TERM GOAL #2   Title Pt will improve 5 times sit<>stand to <13 sec for decreased fall risk    Status New      PT LONG TERM GOAL #3   Title Pt will be able to amb with normal reciprocal gait pattern without a/d for ~50'    Status New      PT LONG TERM GOAL #4   Title Pt will demonstrate at least 4+/5 bilat LE strength bilat    Status New      PT LONG TERM GOAL #5   Title Pt will have improved FOTO score to 31% impairment    Status New      PT LONG TERM GOAL #6   Title Pt will be able to amb x 8-10 min with rollator MI without rest break to assist with community ambulation    Baseline 29 sec    Status On-going                 Plan - 03/21/20 1621    Clinical Impression Statement Treatment focused on ankle/foot strengthening and  balance/proprioception. SpO2 remained stable between 89% to 95% today. Standing PF remains difficult for pt due to increased pressure in pt's knee resulting in too much pain.    Personal Factors and Comorbidities Age;Comorbidity 1;Comorbidity 2;Comorbidity 3+    Comorbidities osteoporosis, chronic R knee OA/pain, diabetes with neuropathy    Examination-Activity Limitations Caring for Others;Carry;Lift;Locomotion Level;Squat;Stairs;Stand    Examination-Participation Restrictions Cleaning;Community Activity    Stability/Clinical Decision Making Evolving/Moderate complexity    Rehab Potential Good    PT Frequency 2x / week    PT Treatment/Interventions ADLs/Self Care Home Management;Aquatic Therapy;Electrical Stimulation;Cryotherapy;Iontophoresis 4mg /ml Dexamethasone;Moist Heat;Ultrasound;Gait training;Stair training;Functional mobility training;Therapeutic activities;Therapeutic exercise;Balance training;Neuromuscular re-education;Patient/family education;Manual techniques;Dry needling;Energy conservation;Taping;Vasopneumatic Device    PT Next Visit Plan Continue to progress endurance and bilat LE strengthenin, balance, and ankle stability as able. Consider leg press machine, step training, use of airex, and working in parallel bars.    PT Home Exercise Plan Access Code: [email or web address]    Consulted and Agree with Plan of Care Patient           Patient will benefit from skilled therapeutic intervention in order to improve the following deficits and impairments:  Difficulty walking, Cardiopulmonary status limiting activity, Decreased endurance, Decreased activity tolerance, Pain, Decreased balance, Decreased mobility, Decreased strength, Increased edema  Visit Diagnosis: Difficulty in walking, not elsewhere classified  Muscle weakness (generalized)  Chronic pain of right knee  Other abnormalities of gait and mobility     Problem List Patient Active Problem List   Diagnosis Date Noted  .  Shortness of breath   . Pneumonitis 07/14/2019  . Pressure injury of skin 07/10/2019  . HCAP (healthcare-associated pneumonia)   . Chronic respiratory failure with hypoxia ([place]) 07/03/2019  . Atypical atrial flutter ([place])   . Normocytic anemia 05/18/2017  . Chronic ITP (idiopathic thrombocytopenia) (HCC) 01/25/2017  . Chronic kidney disease (CKD) stage G4/A1, severely decreased  glomerular filtration rate (GFR) between 15-29 mL/min/1.73 square meter and albuminuria creatinine ratio less than 30 mg/g (HCC) 09/04/2015  . Chronic kidney disease 09/04/2015  . Respiratory failure ([place]) 08/24/2015  . GOUT 06/23/2010  . DERMATITIS 01/16/2010  . BACK PAIN 06/13/2009  . MYALGIA 06/13/2009  . Anxiety state 12/13/2008  . LEG EDEMA, CHRONIC 11/08/2008  . Sleep apnea 10/26/2008  . Hypothyroidism 09/27/2008  . HYPERCHOLESTEROLEMIA 09/27/2008  . OBESITY 09/27/2008  . DEPRESSION 09/27/2008  . Essential hypertension 09/27/2008  . ALLERGIC RHINITIS 09/27/2008  . ASTHMA, CHILDHOOD 09/27/2008    [facility] [address] PT, DPT 03/21/2020, 4:28 PM  [facility] [address] [place], [place], [address] Phone: [phone]   Fax:  [phone]  Name: [name] MRN: [id number] Date of Birth: 12-07-1934

## 2020-03-26 ENCOUNTER — Ambulatory Visit: Payer: Medicare Other | Admitting: Physical Therapy

## 2020-03-26 ENCOUNTER — Other Ambulatory Visit: Payer: Self-pay

## 2020-03-26 DIAGNOSIS — R2689 Other abnormalities of gait and mobility: Secondary | ICD-10-CM

## 2020-03-26 DIAGNOSIS — G8929 Other chronic pain: Secondary | ICD-10-CM

## 2020-03-26 DIAGNOSIS — M6281 Muscle weakness (generalized): Secondary | ICD-10-CM

## 2020-03-26 DIAGNOSIS — R262 Difficulty in walking, not elsewhere classified: Secondary | ICD-10-CM

## 2020-03-26 NOTE — Therapy (Signed)
[place], [place], [address] Phone: [phone]   Fax:  [phone]  Physical Therapy Treatment  Patient Details  Name: [name] MRN: [id number] Date of Birth: September 02, 1935 Referring Provider (PT): Mcarthur Rossetti, MD   Encounter Date: 03/26/2020   PT End of Session - 03/26/20 1101    Visit Number 6    Number of Visits 12    Authorization Type MCR -- needs 10th visit progress note    Progress Note Due on Visit 10    PT Start Time 6834    PT Stop Time 1130    PT Time Calculation (min) 39 min    Activity Tolerance Patient tolerated treatment well    Behavior During Therapy [facility] for tasks assessed/performed           Past Medical History:  Diagnosis Date  . Allergic rhinitis   . Anxiety state, unspecified    panic attacks  . CHF (congestive heart failure) ([place])   . Depressive disorder, not elsewhere classified   . Extrinsic asthma, unspecified    no problem since adulthood  . Obesity   . OSA on CPAP    severe  . Pure hypercholesterolemia   . Respiratory failure with hypoxia ([place]) 09/2008   acute, secondary to multiple bilateral pulmonary embolism , negative hypercoagulable workup 09/2008 hospital stay  . Scoliosis   . Type II or unspecified type diabetes mellitus without mention of complication, not stated as uncontrolled   . Unspecified essential hypertension   . Unspecified hypothyroidism    hypo    Past Surgical History:  Procedure Laterality Date  . CARDIOVERSION N/A 06/26/2019   Procedure: CARDIOVERSION;  Surgeon: Jerline Pain, MD;  Location: [facility] ENDOSCOPY;  Service: Cardiovascular;  Laterality: N/A;  . EYE SURGERY    . JOINT REPLACEMENT    . knee replaced    . THYROID SURGERY      There were no vitals filed for this visit.   Subjective Assessment - 03/26/20 1059    Subjective Pt reports she has been performing the exercises at home.    Limitations  Walking;Lifting;House hold activities    How long can you walk comfortably? Needs rest breaks; limited due to respiratory status    Patient Stated Goals Return back to PLOF; less reliant on rollator    Currently in Pain? No/denies    Pain Onset More than a month ago                             [facility] Adult PT Treatment/Exercise - 03/26/20 0001      Knee/Hip Exercises: Aerobic   Nustep L5 x 7 min with UE/LE      Knee/Hip Exercises: Standing   Gait Training amb 5 min with rollator; in parallel bars 2 x 10'   RPE 8/10   Other Standing Knee Exercises tandem stance 2 x 10 sec, 2 x 5 sec    Other Standing Knee Exercises counter push-up 2 x 5 reps                    PT Short Term Goals - 03/12/20 1052      PT SHORT TERM GOAL #1   Title be independent in initial HEP    Baseline reviwed    Status On-going      PT SHORT TERM GOAL #2   Title walk for 4-5 min without fatigue  to assist with community ambulation with rollator    Baseline Pt with SOB noted after walking from lobby to gym    Time 3    Period Weeks    Status On-going      PT SHORT TERM GOAL #3   Title -    Baseline -      PT SHORT TERM GOAL #4   Title -    Baseline -             PT Long Term Goals - 03/12/20 1034      PT LONG TERM GOAL #1   Title be independent in advanced HEP    Status On-going      PT LONG TERM GOAL #2   Title Pt will improve 5 times sit<>stand to <13 sec for decreased fall risk    Status New      PT LONG TERM GOAL #3   Title Pt will be able to amb with normal reciprocal gait pattern without a/d for ~50'    Status New      PT LONG TERM GOAL #4   Title Pt will demonstrate at least 4+/5 bilat LE strength bilat    Status New      PT LONG TERM GOAL #5   Title Pt will have improved FOTO score to 31% impairment    Status New      PT LONG TERM GOAL #6   Title Pt will be able to amb x 8-10 min with rollator MI without rest break to assist with community  ambulation    Baseline 29 sec    Status On-going                 Plan - 03/26/20 1132    Clinical Impression Statement Treatment focused on endurance and increasing pt's time in standing. Initiated gait training in parallel bars without a/d. Pt reports increased SHOB/fatigue with using her UEs/shoulders pushing her rollator; therefore, PT provided UE exercises. FOTO performed; FOTO score currently 41%.    Personal Factors and Comorbidities Age;Comorbidity 1;Comorbidity 2;Comorbidity 3+    Comorbidities osteoporosis, chronic R knee OA/pain, diabetes with neuropathy    Examination-Activity Limitations Caring for Others;Carry;Lift;Locomotion Level;Squat;Stairs;Stand    Examination-Participation Restrictions Cleaning;Community Activity    Stability/Clinical Decision Making Evolving/Moderate complexity    Rehab Potential Good    PT Frequency 2x / week    PT Treatment/Interventions ADLs/Self Care Home Management;Aquatic Therapy;Electrical Stimulation;Cryotherapy;Iontophoresis 4mg /ml Dexamethasone;Moist Heat;Ultrasound;Gait training;Stair training;Functional mobility training;Therapeutic activities;Therapeutic exercise;Balance training;Neuromuscular re-education;Patient/family education;Manual techniques;Dry needling;Energy conservation;Taping;Vasopneumatic Device    PT Next Visit Plan Continue to progress endurance and bilat LE strengthening, balance, and ankle stability as able. Consider leg press machine, step training, use of airex, and working in parallel bars.    PT Home Exercise Plan Access Code: [email or web address]    Consulted and Agree with Plan of Care Patient           Patient will benefit from skilled therapeutic intervention in order to improve the following deficits and impairments:  Difficulty walking, Cardiopulmonary status limiting activity, Decreased endurance, Decreased activity tolerance, Pain, Decreased balance, Decreased mobility, Decreased strength, Increased edema  Visit  Diagnosis: Difficulty in walking, not elsewhere classified  Muscle weakness (generalized)  Chronic pain of right knee  Other abnormalities of gait and mobility     Problem List Patient Active Problem List   Diagnosis Date Noted  . Shortness of breath   . Pneumonitis 07/14/2019  . Pressure injury of skin 07/10/2019  . HCAP (healthcare-associated  pneumonia)   . Chronic respiratory failure with hypoxia ([place]) 07/03/2019  . Atypical atrial flutter ([place])   . Normocytic anemia 05/18/2017  . Chronic ITP (idiopathic thrombocytopenia) (HCC) 01/25/2017  . Chronic kidney disease (CKD) stage G4/A1, severely decreased glomerular filtration rate (GFR) between 15-29 mL/min/1.73 square meter and albuminuria creatinine ratio less than 30 mg/g (HCC) 09/04/2015  . Chronic kidney disease 09/04/2015  . Respiratory failure ([place]) 08/24/2015  . GOUT 06/23/2010  . DERMATITIS 01/16/2010  . BACK PAIN 06/13/2009  . MYALGIA 06/13/2009  . Anxiety state 12/13/2008  . LEG EDEMA, CHRONIC 11/08/2008  . Sleep apnea 10/26/2008  . Hypothyroidism 09/27/2008  . HYPERCHOLESTEROLEMIA 09/27/2008  . OBESITY 09/27/2008  . DEPRESSION 09/27/2008  . Essential hypertension 09/27/2008  . ALLERGIC RHINITIS 09/27/2008  . ASTHMA, CHILDHOOD 09/27/2008    [facility] [address] PT, DPT 03/26/2020, 12:32 PM  [facility] [address] [place], [place], [address] Phone: [phone]   Fax:  [phone]  Name: EVELENE ROUSSIN MRN: [id number] Date of Birth: 1935-02-23

## 2020-03-28 ENCOUNTER — Ambulatory Visit: Payer: Medicare Other | Admitting: Physical Therapy

## 2020-03-28 ENCOUNTER — Other Ambulatory Visit: Payer: Self-pay

## 2020-03-28 DIAGNOSIS — G8929 Other chronic pain: Secondary | ICD-10-CM

## 2020-03-28 DIAGNOSIS — R262 Difficulty in walking, not elsewhere classified: Secondary | ICD-10-CM

## 2020-03-28 DIAGNOSIS — R2689 Other abnormalities of gait and mobility: Secondary | ICD-10-CM

## 2020-03-28 DIAGNOSIS — M6281 Muscle weakness (generalized): Secondary | ICD-10-CM

## 2020-03-28 DIAGNOSIS — M25561 Pain in right knee: Secondary | ICD-10-CM

## 2020-03-28 NOTE — Therapy (Signed)
[place], [place], [address] Phone: [phone]   Fax:  [phone]  Physical Therapy Treatment  Patient Details  Name: [name] MRN: [id number] Date of Birth: 1934/11/12 Referring Provider (PT): Mcarthur Rossetti, MD   Encounter Date: 03/28/2020   PT End of Session - 03/28/20 1514    Visit Number 7    Number of Visits 12    Authorization Type MCR -- needs 10th visit progress note    Progress Note Due on Visit 10    PT Start Time 5284    PT Stop Time 1536    PT Time Calculation (min) 38 min    Activity Tolerance Patient tolerated treatment well    Behavior During Therapy [facility] for tasks assessed/performed           Past Medical History:  Diagnosis Date  . Allergic rhinitis   . Anxiety state, unspecified    panic attacks  . CHF (congestive heart failure) ([place])   . Depressive disorder, not elsewhere classified   . Extrinsic asthma, unspecified    no problem since adulthood  . Obesity   . OSA on CPAP    severe  . Pure hypercholesterolemia   . Respiratory failure with hypoxia ([place]) 09/2008   acute, secondary to multiple bilateral pulmonary embolism , negative hypercoagulable workup 09/2008 hospital stay  . Scoliosis   . Type II or unspecified type diabetes mellitus without mention of complication, not stated as uncontrolled   . Unspecified essential hypertension   . Unspecified hypothyroidism    hypo    Past Surgical History:  Procedure Laterality Date  . CARDIOVERSION N/A 06/26/2019   Procedure: CARDIOVERSION;  Surgeon: Jerline Pain, MD;  Location: [facility] ENDOSCOPY;  Service: Cardiovascular;  Laterality: N/A;  . EYE SURGERY    . JOINT REPLACEMENT    . knee replaced    . THYROID SURGERY      There were no vitals filed for this visit.   Subjective Assessment - 03/28/20 1503    Subjective Pt states her arms and shoulders were sore after last treatment. Pt states she tried the  counter push-ups at home. Pt did note a little bit of ankle pain.    Limitations Walking;Lifting;House hold activities    How long can you walk comfortably? Needs rest breaks; limited due to respiratory status    Patient Stated Goals Return back to PLOF; less reliant on rollator    Currently in Pain? No/denies    Pain Onset More than a month ago                             [facility] Adult PT Treatment/Exercise - 03/28/20 0001      Knee/Hip Exercises: Machines for Strengthening   Total Gym Leg Press 20# 2 x 10      Knee/Hip Exercises: Standing   Gait Training in parallel bars 4 x 10'    Other Standing Knee Exercises tandem stance 2 x  30 sec    Other Standing Knee Exercises alternating foot tap onto airex x 10 reps      Knee/Hip Exercises: Seated   Sit to Sand with UE support;10 reps      Manual Therapy   Manual Therapy Soft tissue mobilization;Taping    Soft tissue mobilization Rt Fibularis and lateral gastroc    Kinesiotex Create Space;Inhibit Muscle      Kinesiotix   Create Space knee  joint    Inhibit Muscle  fibularis                    PT Short Term Goals - 03/28/20 1754      PT SHORT TERM GOAL #1   Title be independent in initial HEP    Baseline reviwed    Status Achieved      PT SHORT TERM GOAL #2   Title walk for 4-5 min without fatigue to assist with community ambulation with rollator    Baseline Pt with SOB noted after walking from lobby to gym    Time 3    Period Weeks    Status On-going      PT SHORT TERM GOAL #3   Title -    Baseline -      PT SHORT TERM GOAL #4   Title -    Baseline -             PT Long Term Goals - 03/12/20 1034      PT LONG TERM GOAL #1   Title be independent in advanced HEP    Status On-going      PT LONG TERM GOAL #2   Title Pt will improve 5 times sit<>stand to <13 sec for decreased fall risk    Status New      PT LONG TERM GOAL #3   Title Pt will be able to amb with normal reciprocal  gait pattern without a/d for ~50'    Status New      PT LONG TERM GOAL #4   Title Pt will demonstrate at least 4+/5 bilat LE strength bilat    Status New      PT LONG TERM GOAL #5   Title Pt will have improved FOTO score to 31% impairment    Status New      PT LONG TERM GOAL #6   Title Pt will be able to amb x 8-10 min with rollator MI without rest break to assist with community ambulation    Baseline 29 sec    Status On-going                 Plan - 03/28/20 1545    Clinical Impression Statement Treatment focused on endurance, gait training, and balance. SpO2 remained between 85% to 95% with activity. Manual therapy and taping performed due to pt with fibularis and gastroc tightness contributing to pt's knee pain.    Personal Factors and Comorbidities Age;Comorbidity 1;Comorbidity 2;Comorbidity 3+    Comorbidities osteoporosis, chronic R knee OA/pain, diabetes with neuropathy    Examination-Activity Limitations Caring for Others;Carry;Lift;Locomotion Level;Squat;Stairs;Stand    Examination-Participation Restrictions Cleaning;Community Activity    Stability/Clinical Decision Making Evolving/Moderate complexity    Rehab Potential Good    PT Frequency 2x / week    PT Treatment/Interventions ADLs/Self Care Home Management;Aquatic Therapy;Electrical Stimulation;Cryotherapy;Iontophoresis 4mg /ml Dexamethasone;Moist Heat;Ultrasound;Gait training;Stair training;Functional mobility training;Therapeutic activities;Therapeutic exercise;Balance training;Neuromuscular re-education;Patient/family education;Manual techniques;Dry needling;Energy conservation;Taping;Vasopneumatic Device    PT Next Visit Plan Assess response to tape. Continue to progress endurance and bilat LE strengthening, balance, and ankle stability as able. Consider leg press machine, step training, use of airex, and working in parallel bars.    PT Home Exercise Plan Access Code: [email or web address]    Consulted and Agree with Plan of  Care Patient           Patient will benefit from skilled therapeutic intervention in order to improve the following deficits and impairments:  Difficulty walking, Cardiopulmonary  status limiting activity, Decreased endurance, Decreased activity tolerance, Pain, Decreased balance, Decreased mobility, Decreased strength, Increased edema  Visit Diagnosis: Difficulty in walking, not elsewhere classified  Muscle weakness (generalized)  Chronic pain of right knee  Other abnormalities of gait and mobility     Problem List Patient Active Problem List   Diagnosis Date Noted  . Shortness of breath   . Pneumonitis 07/14/2019  . Pressure injury of skin 07/10/2019  . HCAP (healthcare-associated pneumonia)   . Chronic respiratory failure with hypoxia ([place]) 07/03/2019  . Atypical atrial flutter ([place])   . Normocytic anemia 05/18/2017  . Chronic ITP (idiopathic thrombocytopenia) (HCC) 01/25/2017  . Chronic kidney disease (CKD) stage G4/A1, severely decreased glomerular filtration rate (GFR) between 15-29 mL/min/1.73 square meter and albuminuria creatinine ratio less than 30 mg/g (HCC) 09/04/2015  . Chronic kidney disease 09/04/2015  . Respiratory failure ([place]) 08/24/2015  . GOUT 06/23/2010  . DERMATITIS 01/16/2010  . BACK PAIN 06/13/2009  . MYALGIA 06/13/2009  . Anxiety state 12/13/2008  . LEG EDEMA, CHRONIC 11/08/2008  . Sleep apnea 10/26/2008  . Hypothyroidism 09/27/2008  . HYPERCHOLESTEROLEMIA 09/27/2008  . OBESITY 09/27/2008  . DEPRESSION 09/27/2008  . Essential hypertension 09/27/2008  . ALLERGIC RHINITIS 09/27/2008  . ASTHMA, CHILDHOOD 09/27/2008     [facility] [address] PT, DPT 03/28/2020, 5:56 PM  [facility] [address] [place], [place], [address] Phone: [phone]   Fax:  [phone]  Name: [name] MRN: [id number] Date of Birth: 21-Aug-1935

## 2020-04-02 ENCOUNTER — Encounter: Payer: Self-pay | Admitting: Family

## 2020-04-02 ENCOUNTER — Inpatient Hospital Stay: Payer: Medicare Other

## 2020-04-02 ENCOUNTER — Other Ambulatory Visit: Payer: Self-pay

## 2020-04-02 ENCOUNTER — Ambulatory Visit: Payer: Medicare Other | Admitting: Physical Therapy

## 2020-04-02 ENCOUNTER — Inpatient Hospital Stay (HOSPITAL_BASED_OUTPATIENT_CLINIC_OR_DEPARTMENT_OTHER): Payer: Medicare Other | Admitting: Family

## 2020-04-02 VITALS — BP 132/38 | HR 60 | Temp 98.0°F | Resp 20 | Ht 61.0 in | Wt 218.5 lb

## 2020-04-02 DIAGNOSIS — R2689 Other abnormalities of gait and mobility: Secondary | ICD-10-CM

## 2020-04-02 DIAGNOSIS — D693 Immune thrombocytopenic purpura: Secondary | ICD-10-CM

## 2020-04-02 DIAGNOSIS — R262 Difficulty in walking, not elsewhere classified: Secondary | ICD-10-CM

## 2020-04-02 DIAGNOSIS — D500 Iron deficiency anemia secondary to blood loss (chronic): Secondary | ICD-10-CM

## 2020-04-02 DIAGNOSIS — G8929 Other chronic pain: Secondary | ICD-10-CM

## 2020-04-02 DIAGNOSIS — M25561 Pain in right knee: Secondary | ICD-10-CM

## 2020-04-02 DIAGNOSIS — D649 Anemia, unspecified: Secondary | ICD-10-CM

## 2020-04-02 DIAGNOSIS — M6281 Muscle weakness (generalized): Secondary | ICD-10-CM

## 2020-04-02 LAB — CBC WITH DIFFERENTIAL (CANCER CENTER ONLY)
Abs Immature Granulocytes: 0.04 10*3/uL (ref 0.00–0.07)
Basophils Absolute: 0 10*3/uL (ref 0.0–0.1)
Basophils Relative: 0 %
Eosinophils Absolute: 0.2 10*3/uL (ref 0.0–0.5)
Eosinophils Relative: 4 %
HCT: 33.3 % — ABNORMAL LOW (ref 36.0–46.0)
Hemoglobin: 11 g/dL — ABNORMAL LOW (ref 12.0–15.0)
Immature Granulocytes: 1 %
Lymphocytes Relative: 21 %
Lymphs Abs: 1 10*3/uL (ref 0.7–4.0)
MCH: 31.3 pg (ref 26.0–34.0)
MCHC: 33 g/dL (ref 30.0–36.0)
MCV: 94.9 fL (ref 80.0–100.0)
Monocytes Absolute: 0.4 10*3/uL (ref 0.1–1.0)
Monocytes Relative: 7 %
Neutro Abs: 3.4 10*3/uL (ref 1.7–7.7)
Neutrophils Relative %: 67 %
Platelet Count: 119 10*3/uL — ABNORMAL LOW (ref 150–400)
RBC: 3.51 MIL/uL — ABNORMAL LOW (ref 3.87–5.11)
RDW: 15.7 % — ABNORMAL HIGH (ref 11.5–15.5)
WBC Count: 5 10*3/uL (ref 4.0–10.5)
nRBC: 0 % (ref 0.0–0.2)

## 2020-04-02 LAB — CMP (CANCER CENTER ONLY)
ALT: 16 U/L (ref 0–44)
AST: 17 U/L (ref 15–41)
Albumin: 4.3 g/dL (ref 3.5–5.0)
Alkaline Phosphatase: 63 U/L (ref 38–126)
Anion gap: 9 (ref 5–15)
BUN: 59 mg/dL — ABNORMAL HIGH (ref 8–23)
CO2: 30 mmol/L (ref 22–32)
Calcium: 10.4 mg/dL — ABNORMAL HIGH (ref 8.9–10.3)
Chloride: 103 mmol/L (ref 98–111)
Creatinine: 1.91 mg/dL — ABNORMAL HIGH (ref 0.44–1.00)
GFR, Est AFR Am: 28 mL/min — ABNORMAL LOW (ref >60)
GFR, Estimated: 24 mL/min — ABNORMAL LOW (ref >60)
Glucose, Bld: 97 mg/dL (ref 70–99)
Potassium: 4.3 mmol/L (ref 3.5–5.1)
Sodium: 142 mmol/L (ref 135–145)
Total Bilirubin: 0.6 mg/dL (ref 0.3–1.2)
Total Protein: 7.3 g/dL (ref 6.5–8.1)

## 2020-04-02 NOTE — Therapy (Signed)
[place], [place], [address] Phone: [phone]   Fax:  [phone]  Physical Therapy Treatment  Patient Details  Name: [name] MRN: [id number] Date of Birth: 12-Dec-1934 Referring Provider (PT): Mcarthur Rossetti, MD   Encounter Date: 04/02/2020   PT End of Session - 04/02/20 1101    Visit Number 8    Number of Visits 12    Authorization Type MCR -- needs 10th visit progress note    Progress Note Due on Visit 10    PT Start Time 1048    PT Stop Time 1132    PT Time Calculation (min) 44 min    Activity Tolerance Patient tolerated treatment well    Behavior During Therapy [facility] for tasks assessed/performed           Past Medical History:  Diagnosis Date  . Allergic rhinitis   . Anxiety state, unspecified    panic attacks  . CHF (congestive heart failure) ([place])   . Depressive disorder, not elsewhere classified   . Extrinsic asthma, unspecified    no problem since adulthood  . Obesity   . OSA on CPAP    severe  . Pure hypercholesterolemia   . Respiratory failure with hypoxia ([place]) 09/2008   acute, secondary to multiple bilateral pulmonary embolism , negative hypercoagulable workup 09/2008 hospital stay  . Scoliosis   . Type II or unspecified type diabetes mellitus without mention of complication, not stated as uncontrolled   . Unspecified essential hypertension   . Unspecified hypothyroidism    hypo    Past Surgical History:  Procedure Laterality Date  . CARDIOVERSION N/A 06/26/2019   Procedure: CARDIOVERSION;  Surgeon: Jerline Pain, MD;  Location: [facility] ENDOSCOPY;  Service: Cardiovascular;  Laterality: N/A;  . EYE SURGERY    . JOINT REPLACEMENT    . knee replaced    . THYROID SURGERY      There were no vitals filed for this visit.   Subjective Assessment - 04/02/20 1050    Subjective Pt states she has been practicing walking without her rollator for short distances at home  with supervision. Pt states she did not feel too short of breath.    Limitations Walking;Lifting;House hold activities    How long can you walk comfortably? Needs rest breaks; limited due to respiratory status    Patient Stated Goals Return back to PLOF; less reliant on rollator    Currently in Pain? No/denies    Pain Onset More than a month ago                             [facility] Adult PT Treatment/Exercise - 04/02/20 0001      Knee/Hip Exercises: Aerobic   Nustep L5 x 6 min      Knee/Hip Exercises: Machines for Strengthening   Total Gym Leg Press 35# 2 x 10      Knee/Hip Exercises: Standing   Gait Training in parallel bars 4 x 10'    Other Standing Knee Exercises SLS 3 attempts bilat, holds ~1 sec    Other Standing Knee Exercises alternating foot tap onto airex x 10 reps      Knee/Hip Exercises: Seated   Sit to Sand without UE support;5 reps      Manual Therapy   Manual Therapy Soft tissue mobilization;Taping    Soft tissue mobilization Rt Fibularis and lateral gastroc    Kinesiotex Create  Space;Inhibit Muscle      Kinesiotix   Create Space knee joint    Inhibit Muscle  fibularis                    PT Short Term Goals - 03/28/20 1754      PT SHORT TERM GOAL #1   Title be independent in initial HEP    Baseline reviwed    Status Achieved      PT SHORT TERM GOAL #2   Title walk for 4-5 min without fatigue to assist with community ambulation with rollator    Baseline Pt with SOB noted after walking from lobby to gym    Time 3    Period Weeks    Status On-going      PT SHORT TERM GOAL #3   Title -    Baseline -      PT SHORT TERM GOAL #4   Title -    Baseline -             PT Long Term Goals - 03/12/20 1034      PT LONG TERM GOAL #1   Title be independent in advanced HEP    Status On-going      PT LONG TERM GOAL #2   Title Pt will improve 5 times sit<>stand to <13 sec for decreased fall risk    Status New      PT LONG  TERM GOAL #3   Title Pt will be able to amb with normal reciprocal gait pattern without a/d for ~50'    Status New      PT LONG TERM GOAL #4   Title Pt will demonstrate at least 4+/5 bilat LE strength bilat    Status New      PT LONG TERM GOAL #5   Title Pt will have improved FOTO score to 31% impairment    Status New      PT LONG TERM GOAL #6   Title Pt will be able to amb x 8-10 min with rollator MI without rest break to assist with community ambulation    Baseline 29 sec    Status On-going                 Plan - 04/02/20 1122    Clinical Impression Statement SpO2 remained between 86% to 95% with activity. Continued to progress pt's endurance with standing, gait, and balance. Pt able to perform sit<>stand without UE support. Initiated attempts with SLS; however, this still remains too difficult for pt. Manual therapy performed for pt's fibularis & gastroc tightness with improved R knee pain.    Personal Factors and Comorbidities Age;Comorbidity 1;Comorbidity 2;Comorbidity 3+    Comorbidities osteoporosis, chronic R knee OA/pain, diabetes with neuropathy    Examination-Activity Limitations Caring for Others;Carry;Lift;Locomotion Level;Squat;Stairs;Stand    Examination-Participation Restrictions Cleaning;Community Activity    Stability/Clinical Decision Making Evolving/Moderate complexity    Rehab Potential Good    PT Frequency 2x / week    PT Treatment/Interventions ADLs/Self Care Home Management;Aquatic Therapy;Electrical Stimulation;Cryotherapy;Iontophoresis 4mg /ml Dexamethasone;Moist Heat;Ultrasound;Gait training;Stair training;Functional mobility training;Therapeutic activities;Therapeutic exercise;Balance training;Neuromuscular re-education;Patient/family education;Manual techniques;Dry needling;Energy conservation;Taping;Vasopneumatic Device    PT Next Visit Plan Continue to progress endurance and bilat LE strengthening, balance, and ankle stability as able. Consider step  training.    PT Home Exercise Plan Access Code: [email or web address]    Consulted and Agree with Plan of Care Patient           Patient will benefit from  skilled therapeutic intervention in order to improve the following deficits and impairments:  Difficulty walking, Cardiopulmonary status limiting activity, Decreased endurance, Decreased activity tolerance, Pain, Decreased balance, Decreased mobility, Decreased strength, Increased edema  Visit Diagnosis: Difficulty in walking, not elsewhere classified  Muscle weakness (generalized)  Chronic pain of right knee  Other abnormalities of gait and mobility     Problem List Patient Active Problem List   Diagnosis Date Noted  . Shortness of breath   . Pneumonitis 07/14/2019  . Pressure injury of skin 07/10/2019  . HCAP (healthcare-associated pneumonia)   . Chronic respiratory failure with hypoxia ([place]) 07/03/2019  . Atypical atrial flutter ([place])   . Normocytic anemia 05/18/2017  . Chronic ITP (idiopathic thrombocytopenia) (H[facility]) 01/25/2017  . Chronic kidney disease (CKD) stage G4/A1, severely decreased glomerular filtration rate (GFR) between 15-29 mL/min/1.73 square meter and albuminuria creatinine ratio less than 30 mg/g (HCC) 09/04/2015  . Chronic kidney disease 09/04/2015  . Respiratory failure ([place]) 08/24/2015  . GOUT 06/23/2010  . DERMATITIS 01/16/2010  . BACK PAIN 06/13/2009  . MYALGIA 06/13/2009  . Anxiety state 12/13/2008  . LEG EDEMA, CHRONIC 11/08/2008  . Sleep apnea 10/26/2008  . Hypothyroidism 09/27/2008  . HYPERCHOLESTEROLEMIA 09/27/2008  . OBESITY 09/27/2008  . DEPRESSION 09/27/2008  . Essential hypertension 09/27/2008  . ALLERGIC RHINITIS 09/27/2008  . ASTHMA, CHILDHOOD 09/27/2008    [facility] [address] PT, DPT 04/02/2020, 11:28 AM  [facility] [address] [place], [place], [address] Phone: [phone]   Fax:  [phone]  Name: ANNITTA FIFIELD MRN:  [id number] Date of Birth: [birth year]

## 2020-04-02 NOTE — Progress Notes (Signed)
Hematology and Oncology Follow Up Visit  [name] [id number] [birth year] 85 y.o. 04/02/2020   Principle Diagnosis:  Immune based thrombocytopenia History of PE Iron deficiency anemia Atrial fib  Current Therapy: Nplate q3week for platelet count < 100K IV Iron as indicated Eliquis 2.5 mg po BID   Interim History:  [name] is here today with her daughter for follow-up. She is still doing quite well and is so happy to be home.  She does note fatigue after she finishes her physical therapy.  Her platelets count remains stable at 119 and Hgb 11.0.  She has not noted any blood loss. No petechiae.  No fever, chills, n/v, cough, rash, dizziness, SOB, chest pain, palpitations, abdominal pain or changes in bowel or bladder habits.  The swelling and hyperpigmentation in her lower extremities is stable. Pedal pulses are 2+.  She still has a little numbness and tingling in the left pinky finger.  No falls or syncopal episodes to report. She uses her walker at home when ambulating for added support.  She states that she has a good appetite and is staying well hydrated. Her weight is stable.   ECOG Performance Status: 1 - Symptomatic but completely ambulatory  Medications:  Allergies as of 04/02/2020      Reactions   Adhesive [tape] Other (See Comments)   PULLS OFF THE SKIN   Apixaban Other (See Comments)   Internal Bleeding   Aspirin Itching, Rash, Hives, Swelling   Swelling of her tongue   Mirabegron Other (See Comments)   Patient experienced A-Fib   Pineapple Anaphylaxis, Swelling   Throat swells and blisters on tongue and roof of mouth per patient   Metformin Diarrhea, Nausea Only   Tetracycline Hives   Fluticasone-salmeterol Itching, Rash   Iodinated Diagnostic Agents Itching, Rash      Lactose Intolerance (gi) Other (See Comments)   Gas   Latex Itching, Rash, Other (See Comments)   Pulls off the skin and causes welts   Lisinopril Cough   Metronidazole  Other (See Comments)   Reaction not known   Sulfa Antibiotics Rash   Sulfonamide Derivatives Itching, Rash      Medication List       Accurate as of April 02, 2020  3:14 PM. If you have any questions, ask your nurse or doctor.        acetaminophen 325 MG tablet Commonly known as: TYLENOL Take 650 mg by mouth every 6 (six) hours as needed for moderate pain or fever (for fever or pain/discomfort).   atorvastatin 40 MG tablet Commonly known as: LIPITOR Take 40 mg by mouth at bedtime.   bisacodyl 5 MG EC tablet Commonly known as: DULCOLAX Take 10 mg by mouth every 3 (three) days as needed (for constipation).   feeding supplement (PRO-STAT SUGAR FREE 64) Liqd Take 30 mLs by mouth 2 (two) times daily.   fenofibrate 54 MG tablet TAKE 1 TABLET(54 MG) BY MOUTH DAILY   fexofenadine 180 MG tablet Commonly known as: ALLEGRA Take 180 mg by mouth daily.   FreeStyle Libre 14 Day Sensor Misc Apply new sensor every 14 days.   furosemide 20 MG tablet Commonly known as: LASIX Take 1 tablet (20 mg total) by mouth daily. Take 20 mg by mouth in the morning on Sun/Tues/Thurs/Sat 40mg  on Mon/Wed/Friday   hyoscyamine 0.125 MG tablet Commonly known as: LEVSIN Take 2 tablets by mouth every 4 (four) hours as needed.   insulin lispro 100 UNIT/ML KwikPen Commonly known as: HumaLOG  KwikPen Inject 0.13-0.2 mLs (13-20 Units total) into the skin with breakfast, with lunch, and with evening meal.   Insulin Pen Needle 32G X 4 MM Misc Use 4x a day   INSULIN SYRINGE .3CC/29GX1/2" 29G X 1/2" 0.3 ML Misc Use to inject insulin 4 times a day.   Lantus SoloStar 100 UNIT/ML Solostar Pen Generic drug: insulin glargine Inject 50 Units into the skin daily.   levothyroxine 112 MCG tablet Commonly known as: SYNTHROID TAKE 1 TABLET BY MOUTH DAILY   multivitamin tablet Take 1 tablet by mouth daily.   potassium citrate 10 MEQ (1080 MG) SR tablet Commonly known as: UROCIT-K Take 2 tablets (20 mEq  total) by mouth daily.   sertraline 50 MG tablet Commonly known as: ZOLOFT Take 50 mg by mouth daily.   vitamin C 250 MG tablet Commonly known as: ASCORBIC ACID Take 250 mg by mouth daily.   Vitamin D3 50 MCG (2000 UT) Tabs Take 2,000 Units by mouth daily.       Allergies:  Allergies  Allergen Reactions   Adhesive [Tape] Other (See Comments)    PULLS OFF THE SKIN   Apixaban Other (See Comments)    Internal Bleeding   Aspirin Itching, Rash, Hives and Swelling    Swelling of her tongue   Mirabegron Other (See Comments)    Patient experienced A-Fib   Pineapple Anaphylaxis and Swelling    Throat swells and blisters on tongue and roof of mouth per patient   Metformin Diarrhea and Nausea Only   Tetracycline Hives   Fluticasone-Salmeterol Itching and Rash   Iodinated Diagnostic Agents Itching and Rash        Lactose Intolerance (Gi) Other (See Comments)    Gas    Latex Itching, Rash and Other (See Comments)    Pulls off the skin and causes welts   Lisinopril Cough   Metronidazole Other (See Comments)    Reaction not known   Sulfa Antibiotics Rash   Sulfonamide Derivatives Itching and Rash    Past Medical History, Surgical history, Social history, and Family History were reviewed and updated.  Review of Systems: All other 10 point review of systems is negative.   Physical Exam:  height is 5\' 1"  (1.549 m) and weight is 218 lb 8 oz (99.1 kg) (abnormal). Her oral temperature is 98 F (36.7 C). Her blood pressure is 132/38 (abnormal) and her pulse is 60. Her respiration is 20 and oxygen saturation is 99%.   Wt Readings from Last 3 Encounters:  04/02/20 (!) 218 lb 8 oz (99.1 kg)  02/19/20 213 lb 12.8 oz (97 kg)  01/19/20 215 lb (97.5 kg)    Ocular: Sclerae unicteric, pupils equal, round and reactive to light Ear-nose-throat: Oropharynx clear, dentition fair Lymphatic: No cervical or supraclavicular adenopathy Lungs no rales or rhonchi, good excursion  bilaterally Heart regular rate and rhythm, no murmur appreciated Abd soft, nontender, positive bowel sounds, no liver or spleen tip palpated on exam, no fluid wave MSK no focal spinal tenderness, no joint edema Neuro: non-focal, well-oriented, appropriate affect Breasts: Deferred   Lab Results  Component Value Date   WBC 5.0 04/02/2020   HGB 11.0 (L) 04/02/2020   HCT 33.3 (L) 04/02/2020   MCV 94.9 04/02/2020   PLT 119 (L) 04/02/2020   Lab Results  Component Value Date   FERRITIN 404 (H) 02/19/2020   IRON 69 02/19/2020   TIBC 304 02/19/2020   UIBC 235 02/19/2020   IRONPCTSAT 23 02/19/2020   Lab  Results  Component Value Date   RETICCTPCT 2.8 07/05/2019   RBC 3.51 (L) 04/02/2020   RETICCTABS 73.3 01/28/2011   No results found for: KPAFRELGTCHN, LAMBDASER, KAPLAMBRATIO No results found for: Kandis Cocking, IGMSERUM No results found for: Odetta Pink, SPEI   Chemistry      Component Value Date/Time   NA 142 04/02/2020 1312   NA 145 09/08/2017 1317   NA 142 02/25/2017 1013   K 4.3 04/02/2020 1312   K 4.5 09/08/2017 1317   K 3.8 02/25/2017 1013   CL 103 04/02/2020 1312   CL 98 09/08/2017 1317   CO2 30 04/02/2020 1312   CO2 32 09/08/2017 1317   CO2 26 02/25/2017 1013   BUN 59 (H) 04/02/2020 1312   BUN 34 (H) 09/08/2017 1317   BUN 20.1 02/25/2017 1013   CREATININE 1.91 (H) 04/02/2020 1312   CREATININE 1.9 (H) 09/08/2017 1317   CREATININE 1.8 (H) 02/25/2017 1013      Component Value Date/Time   CALCIUM 10.4 (H) 04/02/2020 1312   CALCIUM 9.3 09/08/2017 1317   CALCIUM 8.7 02/25/2017 1013   ALKPHOS 63 04/02/2020 1312   ALKPHOS 45 09/08/2017 1317   ALKPHOS 65 02/25/2017 1013   AST 17 04/02/2020 1312   AST 16 02/25/2017 1013   ALT 16 04/02/2020 1312   ALT 26 09/08/2017 1317   ALT 13 02/25/2017 1013   BILITOT 0.6 04/02/2020 1312   BILITOT 0.76 02/25/2017 1013       Impression and Plan: [name] is a very  pleasant 85yo caucasian female with immune based thrombocytopenia. No Nplate needed for platelet count of 119.  Iron studies are pending. We can replace if needed.  We will plan to see her again in another 4 weeks.  She will contact our office with any questions or concerns. We can certainly see her sooner if needed.   Laverna Peace, NP 7/27/20213:14 PM

## 2020-04-03 LAB — IRON AND TIBC
Iron: 74 ug/dL (ref 41–142)
Saturation Ratios: 25 % (ref 21–57)
TIBC: 299 ug/dL (ref 236–444)
UIBC: 224 ug/dL (ref 120–384)

## 2020-04-03 LAB — FERRITIN: Ferritin: 375 ng/mL — ABNORMAL HIGH (ref 11–307)

## 2020-04-04 ENCOUNTER — Other Ambulatory Visit: Payer: Self-pay

## 2020-04-04 ENCOUNTER — Ambulatory Visit: Payer: Medicare Other | Admitting: Physical Therapy

## 2020-04-04 DIAGNOSIS — M6281 Muscle weakness (generalized): Secondary | ICD-10-CM

## 2020-04-04 DIAGNOSIS — R262 Difficulty in walking, not elsewhere classified: Secondary | ICD-10-CM

## 2020-04-04 DIAGNOSIS — G8929 Other chronic pain: Secondary | ICD-10-CM

## 2020-04-04 DIAGNOSIS — R2689 Other abnormalities of gait and mobility: Secondary | ICD-10-CM

## 2020-04-04 NOTE — Therapy (Signed)
[facility] [place], [place], [address] Phone: [phone]   Fax:  [phone]  Physical Therapy Treatment  Patient Details  Name: [name] MRN: [id number] Date of Birth: [birth year] Referring Provider (PT): Mcarthur Rossetti, MD   Encounter Date: 04/04/2020   PT End of Session - 04/04/20 1513    Visit Number 9    Number of Visits 12    Authorization Type MCR -- needs 10th visit progress note    Progress Note Due on Visit 10    PT Start Time 4944    PT Stop Time 1530    PT Time Calculation (min) 43 min    Activity Tolerance Patient tolerated treatment well    Behavior During Therapy [facility] for tasks assessed/performed           Past Medical History:  Diagnosis Date  . Allergic rhinitis   . Anxiety state, unspecified    panic attacks  . CHF (congestive heart failure) ([place])   . Depressive disorder, not elsewhere classified   . Extrinsic asthma, unspecified    no problem since adulthood  . Obesity   . OSA on CPAP    severe  . Pure hypercholesterolemia   . Respiratory failure with hypoxia ([place]) 09/2008   acute, secondary to multiple bilateral pulmonary embolism , negative hypercoagulable workup 09/2008 hospital stay  . Scoliosis   . Type II or unspecified type diabetes mellitus without mention of complication, not stated as uncontrolled   . Unspecified essential hypertension   . Unspecified hypothyroidism    hypo    Past Surgical History:  Procedure Laterality Date  . CARDIOVERSION N/A 06/26/2019   Procedure: CARDIOVERSION;  Surgeon: Jerline Pain, MD;  Location: [facility] ENDOSCOPY;  Service: Cardiovascular;  Laterality: N/A;  . EYE SURGERY    . JOINT REPLACEMENT    . knee replaced    . THYROID SURGERY      There were no vitals filed for this visit.   Subjective Assessment - 04/04/20 1453    Subjective Pt reports she was exhausted after last session. Pt states she was sore the next day but  it eased after going to the chiropractor.    Limitations Walking;Lifting;House hold activities    How long can you walk comfortably? Needs rest breaks; limited due to respiratory status    Patient Stated Goals Return back to PLOF; less reliant on rollator    Currently in Pain? No/denies                             [facility] Adult PT Treatment/Exercise - 04/04/20 0001      Knee/Hip Exercises: Stretches   Other Knee/Hip Stretches step up on 4" step and stretch x 10      Knee/Hip Exercises: Aerobic   Nustep L6 x 7 min      Knee/Hip Exercises: Machines for Strengthening   Total Gym Leg Press 45# 2 x 10      Manual Therapy   Manual Therapy Soft tissue mobilization;Taping    Soft tissue mobilization Rt Fibularis and lateral gastroc; self trigger point release with tennis ball      Ankle Exercises: Standing   Other Standing Ankle Exercises on airex: feet apart eyes open x 30 sec, eyes closed 2 x 30 sec                    PT Short Term Goals -  03/28/20 1754      PT SHORT TERM GOAL #1   Title be independent in initial HEP    Baseline reviwed    Status Achieved      PT SHORT TERM GOAL #2   Title walk for 4-5 min without fatigue to assist with community ambulation with rollator    Baseline Pt with SOB noted after walking from lobby to gym    Time 3    Period Weeks    Status On-going      PT SHORT TERM GOAL #3   Title -    Baseline -      PT SHORT TERM GOAL #4   Title -    Baseline -             PT Long Term Goals - 03/12/20 1034      PT LONG TERM GOAL #1   Title be independent in advanced HEP    Status On-going      PT LONG TERM GOAL #2   Title Pt will improve 5 times sit<>stand to <13 sec for decreased fall risk    Status New      PT LONG TERM GOAL #3   Title Pt will be able to amb with normal reciprocal gait pattern without a/d for ~50'    Status New      PT LONG TERM GOAL #4   Title Pt will demonstrate at least 4+/5 bilat LE  strength bilat    Status New      PT LONG TERM GOAL #5   Title Pt will have improved FOTO score to 31% impairment    Status New      PT LONG TERM GOAL #6   Title Pt will be able to amb x 8-10 min with rollator MI without rest break to assist with community ambulation    Baseline 29 sec    Status On-going                 Plan - 04/04/20 1514    Clinical Impression Statement SpO2 remained stable throughout treatment. Progressed pt's balance on to airex and performed exercise on steps. Continued exercises to challenge endurance.    Personal Factors and Comorbidities Age;Comorbidity 1;Comorbidity 2;Comorbidity 3+    Comorbidities osteoporosis, chronic R knee OA/pain, diabetes with neuropathy    Examination-Activity Limitations Caring for Others;Carry;Lift;Locomotion Level;Squat;Stairs;Stand    Examination-Participation Restrictions Cleaning;Community Activity    Stability/Clinical Decision Making Evolving/Moderate complexity    Rehab Potential Good    PT Frequency 2x / week    PT Treatment/Interventions ADLs/Self Care Home Management;Aquatic Therapy;Electrical Stimulation;Cryotherapy;Iontophoresis 4mg /ml Dexamethasone;Moist Heat;Ultrasound;Gait training;Stair training;Functional mobility training;Therapeutic activities;Therapeutic exercise;Balance training;Neuromuscular re-education;Patient/family education;Manual techniques;Dry needling;Energy conservation;Taping;Vasopneumatic Device    PT Next Visit Plan Continue to progress endurance and bilat LE strengthening, balance, and ankle stability as able. Consider step training.    PT Home Exercise Plan Access Code: [email or web address]    Consulted and Agree with Plan of Care Patient           Patient will benefit from skilled therapeutic intervention in order to improve the following deficits and impairments:  Difficulty walking, Cardiopulmonary status limiting activity, Decreased endurance, Decreased activity tolerance, Pain, Decreased balance,  Decreased mobility, Decreased strength, Increased edema  Visit Diagnosis: Difficulty in walking, not elsewhere classified  Muscle weakness (generalized)  Chronic pain of right knee  Other abnormalities of gait and mobility     Problem List Patient Active Problem List   Diagnosis Date Noted  .  Shortness of breath   . Pneumonitis 07/14/2019  . Pressure injury of skin 07/10/2019  . HCAP (healthcare-associated pneumonia)   . Chronic respiratory failure with hypoxia ([place]) 07/03/2019  . Atypical atrial flutter ([place])   . Normocytic anemia 05/18/2017  . Chronic ITP (idiopathic thrombocytopenia) ([facility]) 01/25/2017  . Chronic kidney disease (CKD) stage G4/A1, severely decreased glomerular filtration rate (GFR) between 15-29 mL/min/1.73 square meter and albuminuria creatinine ratio less than 30 mg/g ([facility]) 09/04/2015  . Chronic kidney disease 09/04/2015  . Respiratory failure ([place]) 08/24/2015  . GOUT 06/23/2010  . DERMATITIS 01/16/2010  . BACK PAIN 06/13/2009  . MYALGIA 06/13/2009  . Anxiety state 12/13/2008  . LEG EDEMA, CHRONIC 11/08/2008  . Sleep apnea 10/26/2008  . Hypothyroidism 09/27/2008  . HYPERCHOLESTEROLEMIA 09/27/2008  . OBESITY 09/27/2008  . DEPRESSION 09/27/2008  . Essential hypertension 09/27/2008  . ALLERGIC RHINITIS 09/27/2008  . ASTHMA, CHILDHOOD 09/27/2008    [facility] [address] PT, DPT 04/04/2020, 3:52 PM  [facility] [address] [place], [place], [address] Phone: [phone]   Fax:  [phone]  Name: [name] MRN: [id number] Date of Birth: 07-31-1935

## 2020-04-05 ENCOUNTER — Encounter: Payer: Self-pay | Admitting: Family Medicine

## 2020-04-05 ENCOUNTER — Ambulatory Visit (INDEPENDENT_AMBULATORY_CARE_PROVIDER_SITE_OTHER): Payer: Medicare Other | Admitting: Family Medicine

## 2020-04-05 VITALS — BP 144/80 | HR 102 | Temp 98.0°F | Ht 61.0 in | Wt 219.8 lb

## 2020-04-05 DIAGNOSIS — N3000 Acute cystitis without hematuria: Secondary | ICD-10-CM | POA: Diagnosis not present

## 2020-04-05 DIAGNOSIS — Z794 Long term (current) use of insulin: Secondary | ICD-10-CM

## 2020-04-05 DIAGNOSIS — E039 Hypothyroidism, unspecified: Secondary | ICD-10-CM

## 2020-04-05 DIAGNOSIS — E7800 Pure hypercholesterolemia, unspecified: Secondary | ICD-10-CM | POA: Diagnosis not present

## 2020-04-05 DIAGNOSIS — R609 Edema, unspecified: Secondary | ICD-10-CM

## 2020-04-05 DIAGNOSIS — E559 Vitamin D deficiency, unspecified: Secondary | ICD-10-CM

## 2020-04-05 DIAGNOSIS — D693 Immune thrombocytopenic purpura: Secondary | ICD-10-CM

## 2020-04-05 DIAGNOSIS — I10 Essential (primary) hypertension: Secondary | ICD-10-CM | POA: Diagnosis not present

## 2020-04-05 DIAGNOSIS — E119 Type 2 diabetes mellitus without complications: Secondary | ICD-10-CM

## 2020-04-05 DIAGNOSIS — N184 Chronic kidney disease, stage 4 (severe): Secondary | ICD-10-CM

## 2020-04-05 DIAGNOSIS — E538 Deficiency of other specified B group vitamins: Secondary | ICD-10-CM

## 2020-04-05 NOTE — Progress Notes (Signed)
[name] DOB: [birth year] Encounter date: 04/05/2020  This is a 85 y.o. female who presents to establish care. Chief Complaint  Patient presents with  . Establish Care    History of present illness: "I'm just thankful I'm alive"  Was in assisted living in [place] and getting ready to have heart ablation. Had this done in October. When done, needed oxygen. Started in assisted living. Hard to get off of oxygen. A week after ablation couldn't breathe and was sent to [facility]. Has hx of CHF, worried it was ablation. Ended up being resp. Called pneumonitis. Too weak to tolerate procedure for dx. NOT COVID. After 2.5 weeks at hospital, was on 5L of O2 and she was sent back to [facility] late evening. Daughter called next day and was transferred to [facility] long ICU step down for 2-3 weeks. Hospitalist approached them and suggested hospice care. (Hx of PE) Pulmonologist kept treating. Was up to 30L of oxygen on bipap at worst. Within 2 days of discussion for hospice she was suddenly down to 5L oxygen and was 75% improved. Took a day or two of obs and then ended up d/c to rehab at [facility]. Had pure-wic and it sucked off skin and she couldn't walk for months - unable to walk for months while recovering from this; just started walking again in march. Can walk by self with walker now.   Has cardiologist: Dr. Marlou Porch  Hematology: was getting platelets for 2-3 years; seems that body has reset and blood counts are normal now. Following q 1 month now. Dr. Katheran Awe.   Pulmonology:hx of pulm embolisms.   Urology: has had two UTIs in last couple of months. Has been treated with cipro both times.   Nephrology: has stage III kidney disease. Has had 2 kidney stones. Had multiple stents. This was done in [place].   Endocrinologist: sugars are better after being out of hospital (and off steroids). Sugars sent in through [facility] weekly; and they are pleased with these.   Intermittent depression in  past: states that she states pretty not depressed unless triggered. Generally does well. Was very anxious during recent hospitalization. Didn't like being alone. Was given ativan in hospital.   UTIs were back to back. Had sx of inability to urinate. Blood sugar tends to spike up 10-20 points when she has UTI. Treated   Past Medical History:  Diagnosis Date  . Allergic rhinitis   . Anxiety state, unspecified    panic attacks  . CHF (congestive heart failure) ([place])   . Chronic respiratory failure with hypoxia ([place]) 07/03/2019  . Depressive disorder, not elsewhere classified   . Extrinsic asthma, unspecified    no problem since adulthood  . HCAP (healthcare-associated pneumonia)   . Obesity   . OSA on CPAP    severe  . Pneumonitis 07/14/2019  . Pressure injury of skin 07/10/2019  . Pure hypercholesterolemia   . Respiratory failure ([place]) 08/24/2015  . Respiratory failure with hypoxia ([place]) 09/2008   acute, secondary to multiple bilateral pulmonary embolism , negative hypercoagulable workup 09/2008 hospital stay  . Scoliosis   . Type II or unspecified type diabetes mellitus without mention of complication, not stated as uncontrolled   . Unspecified essential hypertension   . Unspecified hypothyroidism    hypo   Past Surgical History:  Procedure Laterality Date  . CARDIOVERSION N/A 06/26/2019   Procedure: CARDIOVERSION;  Surgeon: Jerline Pain, MD;  Location: [facility] ENDOSCOPY;  Service: Cardiovascular;  Laterality: N/A;  .  EYE SURGERY Bilateral 2005   cataracts, implants  . KIDNEY STONE SURGERY Bilateral    2016, 2017   . knee replaced Left 2013  . THYROID SURGERY  1966   nodule removal; partial thyroidectomy; radioactive iodine x2; 1990's   Allergies  Allergen Reactions  . Adhesive [Tape] Other (See Comments)    PULLS OFF THE SKIN  . Apixaban Other (See Comments)    Internal Bleeding  . Aspirin Itching, Rash, Hives and Swelling    Swelling of her tongue  . Mirabegron Other (See  Comments)    Patient experienced A-Fib  . Pineapple Anaphylaxis and Swelling    Throat swells and blisters on tongue and roof of mouth per patient  . Metformin Diarrhea and Nausea Only  . Tetracycline Hives  . Fluticasone-Salmeterol Itching and Rash  . Iodinated Diagnostic Agents Itching and Rash       . Lactose Intolerance (Gi) Other (See Comments)    Gas   . Latex Itching, Rash and Other (See Comments)    Pulls off the skin and causes welts  . Lisinopril Cough  . Metronidazole Other (See Comments)    Reaction not known  . Sulfa Antibiotics Rash  . Sulfonamide Derivatives Itching and Rash   Current Meds  Medication Sig  . acetaminophen (TYLENOL) 325 MG tablet Take 650 mg by mouth every 6 (six) hours as needed for moderate pain or fever (for fever or pain/discomfort).   . Amino Acids-Protein Hydrolys (FEEDING SUPPLEMENT, PRO-STAT SUGAR FREE 64,) LIQD Take 30 mLs by mouth 2 (two) times daily.  Marland Kitchen atorvastatin (LIPITOR) 40 MG tablet Take 40 mg by mouth at bedtime.   . bisacodyl (DULCOLAX) 5 MG EC tablet Take 10 mg by mouth every 3 (three) days as needed (for constipation).   . Cholecalciferol (VITAMIN D3) 2000 units TABS Take 2,000 Units by mouth daily.  . Continuous Blood Gluc Sensor (FREESTYLE LIBRE 14 DAY SENSOR) MISC Apply new sensor every 14 days.  . fenofibrate 54 MG tablet TAKE 1 TABLET(54 MG) BY MOUTH DAILY  . fexofenadine (ALLEGRA) 180 MG tablet Take 180 mg by mouth daily.  . furosemide (LASIX) 20 MG tablet Take 1 tablet (20 mg total) by mouth daily. Take 20 mg by mouth in the morning on Sun/Tues/Thurs/Sat 40mg  on Mon/Wed/Friday  . hyoscyamine (LEVSIN) 0.125 MG tablet Take 1 tablet by mouth in the morning and at bedtime.   . insulin glargine (LANTUS SOLOSTAR) 100 UNIT/ML Solostar Pen Inject 50 Units into the skin daily. (Patient taking differently: Inject 46 Units into the skin daily. )  . insulin lispro (HUMALOG KWIKPEN) 100 UNIT/ML KwikPen Inject 0.13-0.2 mLs (13-20 Units  total) into the skin with breakfast, with lunch, and with evening meal.  . Insulin Pen Needle 32G X 4 MM MISC Use 4x a day  . Insulin Syringe-Needle U-100 (INSULIN SYRINGE .3CC/29GX1/2") 29G X 1/2" 0.3 ML MISC Use to inject insulin 4 times a day.  . levothyroxine (SYNTHROID, LEVOTHROID) 112 MCG tablet TAKE 1 TABLET BY MOUTH DAILY  . Multiple Vitamin (MULTIVITAMIN) tablet Take 1 tablet by mouth daily.    . potassium citrate (UROCIT-K) 10 MEQ (1080 MG) SR tablet Take 2 tablets (20 mEq total) by mouth daily. (Patient taking differently: Take 20 mEq by mouth in the morning and at bedtime. )  . sertraline (ZOLOFT) 50 MG tablet Take 50 mg by mouth daily.  . vitamin C (ASCORBIC ACID) 250 MG tablet Take 250 mg by mouth daily.   Social History   Tobacco  Use  . Smoking status: Former Smoker    Packs/day: 0.25    Years: 20.00    Pack years: 5.00    Types: Cigarettes    Quit date: 09/07/1989    Years since quitting: 30.6  . Smokeless tobacco: Never Used  Substance Use Topics  . Alcohol use: No   Family History  Problem Relation Age of Onset  . Hypertension Father   . Hyperlipidemia Father   . Allergies Mother   . Clotting disorder Mother   . Osteoarthritis Mother   . Asthma Mother   . Hypertension Mother   . Stroke Mother   . Coronary artery disease Other   . Stroke Other   . Rheum arthritis Maternal Grandmother   . Clotting disorder Maternal Grandmother   . Clotting disorder Maternal Uncle   . Arthritis Daughter   . Diabetes Daughter   . High blood pressure Daughter   . High Cholesterol Daughter   . Depression Maternal Grandfather   . Diabetes Paternal Grandmother   . Breast cancer Neg Hx      Review of Systems  Constitutional: Negative for chills, fatigue and fever.  Respiratory: Negative for cough, chest tightness, shortness of breath and wheezing.   Cardiovascular: Positive for leg swelling. Negative for chest pain and palpitations.  Neurological: Negative for dizziness,  light-headedness and headaches.    Objective:  BP (!) 144/80 (BP Location: Right Arm, Patient Position: Sitting, Cuff Size: Large)   Pulse 102   Temp 98 F (36.7 C) (Oral)   Ht 5\' 1"  (1.549 m)   Wt (!) 219 lb 12.8 oz (99.7 kg)   LMP  (LMP Unknown)   BMI 41.53 kg/m   Weight: (!) 219 lb 12.8 oz (99.7 kg)   BP Readings from Last 3 Encounters:  04/05/20 (!) 144/80  04/02/20 (!) 132/38  02/19/20 (!) 138/55   Wt Readings from Last 3 Encounters:  04/05/20 (!) 219 lb 12.8 oz (99.7 kg)  04/02/20 (!) 218 lb 8 oz (99.1 kg)  02/19/20 213 lb 12.8 oz (97 kg)    Physical Exam Constitutional:      General: She is not in acute distress.    Appearance: She is well-developed.  Cardiovascular:     Rate and Rhythm: Normal rate and regular rhythm. Occasional extrasystoles are present.    Heart sounds: Normal heart sounds. No murmur heard.  No friction rub.  Pulmonary:     Effort: Pulmonary effort is normal. No respiratory distress.     Breath sounds: Normal breath sounds. No wheezing or rales.  Abdominal:     General: Abdomen is protuberant. Bowel sounds are normal.  Musculoskeletal:     Right lower leg: 2+ Edema present.     Left lower leg: 2+ Edema present.  Neurological:     Mental Status: She is alert and oriented to person, place, and time.  Psychiatric:        Behavior: Behavior normal.     Assessment/Plan:  1. Acute cystitis without hematuria We will check urine culture today.  She has had 2 recent UTIs and treated with Cipro for both.  Follow-up/treatment pending culture result. - Urine Culture; Future - Urine Culture  2. Essential hypertension Blood pressure slightly elevated today, but stable.  Continue to monitor at home.  Continue current medications. - Comprehensive metabolic panel; Future - CBC with Differential/Platelet; Future  3. Edema, unspecified type We discussed benefit of compression stockings.  Coupon given for her to try and get these at local  med  supply.  She does have compression stockings from the hospital at home and has a nurse as well as her daughter who can help with these on for her.  Keep legs elevated when sitting.  I think her legs will feel more comfortable if she is able to keep some of the fluid out.  4. HYPERCHOLESTEROLEMIA Continue current statin.  Recheck baseline when able. - Lipid panel; Future  5. Chronic ITP (idiopathic thrombocytopenia) (HCC) Blood counts have improved since hospitalization.  She does follow regularly with hematology.  6. Hypothyroidism, unspecified type Stable.  Continue medication. - TSH; Future  7. Chronic kidney disease (CKD) stage G4/A1, severely decreased glomerular filtration rate (GFR) between 15-29 mL/min/1.73 square meter and albuminuria creatinine ratio less than 30 mg/g (HCC) Follows with nephrology regularly.  Kidney function has been stable (although at stage IV)  8. Insulin dependent type 2 diabetes mellitus ([place]) Follows with endocrinology.  Blood sugars have been well controlled. - Hemoglobin A1c; Future  9. B12 deficiency - Vitamin B12; Future  10. Vitamin D deficiency - VITAMIN D 25 Hydroxy (Vit-D Deficiency, Fractures); Future   Return in about 3 months (around 07/06/2020) for Chronic condition visit.  Micheline Rough, MD   Time spent with patient, exam, charting, chart review over 45 minutes.

## 2020-04-06 ENCOUNTER — Encounter: Payer: Self-pay | Admitting: Family Medicine

## 2020-04-07 LAB — URINE CULTURE
MICRO NUMBER:: 10770575
SPECIMEN QUALITY:: ADEQUATE

## 2020-04-09 ENCOUNTER — Ambulatory Visit: Payer: Medicare Other | Attending: Orthopaedic Surgery | Admitting: Physical Therapy

## 2020-04-09 ENCOUNTER — Other Ambulatory Visit: Payer: Self-pay

## 2020-04-09 DIAGNOSIS — G8929 Other chronic pain: Secondary | ICD-10-CM | POA: Insufficient documentation

## 2020-04-09 DIAGNOSIS — R262 Difficulty in walking, not elsewhere classified: Secondary | ICD-10-CM | POA: Diagnosis not present

## 2020-04-09 DIAGNOSIS — R2689 Other abnormalities of gait and mobility: Secondary | ICD-10-CM | POA: Diagnosis present

## 2020-04-09 DIAGNOSIS — M6281 Muscle weakness (generalized): Secondary | ICD-10-CM | POA: Diagnosis present

## 2020-04-09 DIAGNOSIS — M25561 Pain in right knee: Secondary | ICD-10-CM | POA: Diagnosis present

## 2020-04-09 NOTE — Therapy (Addendum)
[facility] [address] [place], [place], [address] Phone: [phone]   Fax:  [phone]  Physical Therapy Treatment and 10th Visit Progress Note  Progress Note Reporting Period 03/04/2020 to 04/09/2020  See note below for Objective Data and Assessment of Progress/Goals.    Patient Details  Name: [name] MRN: [id number] Date of Birth: 08/09/1935 Referring Provider (PT): Blackman, Christopher Y, MD   Encounter Date: 04/09/2020   PT End of Session - 04/09/20 1450    Visit Number 10    Number of Visits 12    Authorization Type MCR -- needs 10th visit progress note    Progress Note Due on Visit 10    PT Start Time 1450    PT Stop Time 1535    PT Time Calculation (min) 45 min    Activity Tolerance Patient tolerated treatment well    Behavior During Therapy WFL for tasks assessed/performed           Past Medical History:  Diagnosis Date  . Allergic rhinitis   . Anxiety state, unspecified    panic attacks  . CHF (congestive heart failure) (HCC)   . Chronic respiratory failure with hypoxia (HCC) 07/03/2019  . Depressive disorder, not elsewhere classified   . Extrinsic asthma, unspecified    no problem since adulthood  . HCAP (healthcare-associated pneumonia)   . Obesity   . OSA on CPAP    severe  . Pneumonitis 07/14/2019  . Pressure injury of skin 07/10/2019  . Pure hypercholesterolemia   . Respiratory failure (HCC) 08/24/2015  . Respiratory failure with hypoxia (HCC) 09/2008   acute, secondary to multiple bilateral pulmonary embolism , negative hypercoagulable workup 09/2008 hospital stay  . Scoliosis   . Type II or unspecified type diabetes mellitus without mention of complication, not stated as uncontrolled   . Unspecified essential hypertension   . Unspecified hypothyroidism    hypo    Past Surgical History:  Procedure Laterality Date  . CARDIOVERSION N/A 06/26/2019   Procedure: CARDIOVERSION;   Surgeon: Skains, Mark C, MD;  Location: [facility] ENDOSCOPY;  Service: Cardiovascular;  Laterality: N/A;  . EYE SURGERY Bilateral 2005   cataracts, implants  . KIDNEY STONE SURGERY Bilateral    2016, 2017   . knee replaced Left 2013  . THYROID SURGERY  1966   nodule removal; partial thyroidectomy; radioactive iodine x2; 1990's    There were no vitals filed for this visit.   Subjective Assessment - 04/09/20 1459    Subjective Pt states she's been doing well. No issues to report. Pt states she's been using the tennis ball to help her muscle tightness.    Limitations Walking;Lifting;House hold activities    How long can you walk comfortably? Needs rest breaks; limited due to respiratory status    Patient Stated Goals Return back to PLOF; less reliant on rollator    Currently in Pain? No/denies                             [facility] Adult PT Treatment/Exercise - 04/09/20 0001      Knee/Hip Exercises: Aerobic   Nustep L6 x 8.5 min    Other Aerobic forward step up 30 sec, 1 min, 1.5 min      Knee/Hip Exercises: Standing   Heel Raises Both;10 reps    Gait Training in parallel bars 2 x 15'      Knee/Hip Exercises: Seated   Sit   to Sand without UE support;5 reps   27 sec                   PT Short Term Goals - 04/09/20 1704      PT SHORT TERM GOAL #1   Title be independent in initial HEP    Baseline reviwed    Status Achieved      PT SHORT TERM GOAL #2   Title walk for 4-5 min without fatigue to assist with community ambulation with rollator    Baseline Pt with SOB noted after walking from lobby to gym    Time 3    Period Weeks    Status Achieved      PT SHORT TERM GOAL #3   Title -    Baseline -      PT SHORT TERM GOAL #4   Title -    Baseline -             PT Long Term Goals - 04/09/20 1705      PT LONG TERM GOAL #1   Title be independent in advanced HEP    Status On-going      PT LONG TERM GOAL #2   Title Pt will improve 5 times  sit<>stand to <13 sec for decreased fall risk    Status On-going      PT LONG TERM GOAL #3   Title Pt will be able to amb with normal reciprocal gait pattern without a/d for ~50'    Status Partially Met      PT LONG TERM GOAL #4   Title Pt will demonstrate at least 4+/5 bilat LE strength bilat    Status Achieved      PT LONG TERM GOAL #5   Title Pt will have improved FOTO score to 31% impairment    Status On-going      PT LONG TERM GOAL #6   Title Pt will be able to amb x 8-10 min with rollator MI without rest break to assist with community ambulation    Baseline 29 sec    Status On-going                 Plan - 04/09/20 1531    Clinical Impression Statement 10th visit MCR progress note. SpO2 remained stable throughout treatment. HR between 70 to 110s. Treatment focused on progressing pt's endurance in standing and aerobic capacity with step training. Pt is making gains towards her goals -- her endurance is improving and she is increasing her time ambulating without rollator; however, she has still not met all of her goals. Pt would continue to benefit from PT to continue to reach her goals.    Personal Factors and Comorbidities Age;Comorbidity 1;Comorbidity 2;Comorbidity 3+    Comorbidities osteoporosis, chronic R knee OA/pain, diabetes with neuropathy    Examination-Activity Limitations Caring for Others;Carry;Lift;Locomotion Level;Squat;Stairs;Stand    Examination-Participation Restrictions Cleaning;Community Activity    Stability/Clinical Decision Making Evolving/Moderate complexity    Rehab Potential Good    PT Frequency 2x / week    PT Treatment/Interventions ADLs/Self Care Home Management;Aquatic Therapy;Electrical Stimulation;Cryotherapy;Iontophoresis 4mg/ml Dexamethasone;Moist Heat;Ultrasound;Gait training;Stair training;Functional mobility training;Therapeutic activities;Therapeutic exercise;Balance training;Neuromuscular re-education;Patient/family education;Manual  techniques;Dry needling;Energy conservation;Taping;Vasopneumatic Device    PT Next Visit Plan Continue to progress endurance and bilat LE strengthening, balance, and ankle stability as able. Consider step training.    PT Home Exercise Plan Access Code: [email or web address]    Consulted and Agree with Plan of Care Patient             Patient will benefit from skilled therapeutic intervention in order to improve the following deficits and impairments:  Difficulty walking, Cardiopulmonary status limiting activity, Decreased endurance, Decreased activity tolerance, Pain, Decreased balance, Decreased mobility, Decreased strength, Increased edema  Visit Diagnosis: Difficulty in walking, not elsewhere classified  Muscle weakness (generalized)  Chronic pain of right knee  Other abnormalities of gait and mobility     Problem List Patient Active Problem List   Diagnosis Date Noted  . Atypical atrial flutter ([place])   . Normocytic anemia 05/18/2017  . Chronic ITP (idiopathic thrombocytopenia) (HCC) 01/25/2017  . Chronic kidney disease (CKD) stage G4/A1, severely decreased glomerular filtration rate (GFR) between 15-29 mL/min/1.73 square meter and albuminuria creatinine ratio less than 30 mg/g (HCC) 09/04/2015  . GOUT 06/23/2010  . DERMATITIS 01/16/2010  . BACK PAIN 06/13/2009  . MYALGIA 06/13/2009  . Anxiety state 12/13/2008  . LEG EDEMA, CHRONIC 11/08/2008  . Sleep apnea 10/26/2008  . Hypothyroidism 09/27/2008  . HYPERCHOLESTEROLEMIA 09/27/2008  . OBESITY 09/27/2008  . DEPRESSION 09/27/2008  . Essential hypertension 09/27/2008  . ALLERGIC RHINITIS 09/27/2008  . ASTHMA, CHILDHOOD 09/27/2008    [name] 04/09/2020, 5:09 PM  [facility] [address] [place], [place], [address] Phone: [phone]   Fax:  [phone]  Name: [name] MRN: [id number] Date of Birth: 1935/06/13

## 2020-04-11 ENCOUNTER — Other Ambulatory Visit: Payer: Self-pay

## 2020-04-11 ENCOUNTER — Ambulatory Visit: Payer: Medicare Other | Admitting: Physical Therapy

## 2020-04-11 DIAGNOSIS — M25561 Pain in right knee: Secondary | ICD-10-CM

## 2020-04-11 DIAGNOSIS — M6281 Muscle weakness (generalized): Secondary | ICD-10-CM

## 2020-04-11 DIAGNOSIS — G8929 Other chronic pain: Secondary | ICD-10-CM

## 2020-04-11 DIAGNOSIS — R262 Difficulty in walking, not elsewhere classified: Secondary | ICD-10-CM

## 2020-04-11 DIAGNOSIS — R2689 Other abnormalities of gait and mobility: Secondary | ICD-10-CM

## 2020-04-11 NOTE — Therapy (Signed)
[place], [place], [address] Phone: [phone]   Fax:  [phone]  Physical Therapy Treatment  Patient Details  Name: [name] MRN: [id number] Date of Birth: Jun 22, 1935 Referring Provider (PT): Mcarthur Rossetti, MD   Encounter Date: 04/11/2020   PT End of Session - 04/11/20 1535    Visit Number 11    Number of Visits 12    Authorization Type MCR -- needs 10th visit progress note    Progress Note Due on Visit 10    PT Start Time 1450    PT Stop Time 1532    PT Time Calculation (min) 42 min    Activity Tolerance Patient tolerated treatment well    Behavior During Therapy [facility] for tasks assessed/performed           Past Medical History:  Diagnosis Date  . Allergic rhinitis   . Anxiety state, unspecified    panic attacks  . CHF (congestive heart failure) ([place])   . Chronic respiratory failure with hypoxia ([place]) 07/03/2019  . Depressive disorder, not elsewhere classified   . Extrinsic asthma, unspecified    no problem since adulthood  . HCAP (healthcare-associated pneumonia)   . Obesity   . OSA on CPAP    severe  . Pneumonitis 07/14/2019  . Pressure injury of skin 07/10/2019  . Pure hypercholesterolemia   . Respiratory failure ([place]) 08/24/2015  . Respiratory failure with hypoxia ([place]) 09/2008   acute, secondary to multiple bilateral pulmonary embolism , negative hypercoagulable workup 09/2008 hospital stay  . Scoliosis   . Type II or unspecified type diabetes mellitus without mention of complication, not stated as uncontrolled   . Unspecified essential hypertension   . Unspecified hypothyroidism    hypo    Past Surgical History:  Procedure Laterality Date  . CARDIOVERSION N/A 06/26/2019   Procedure: CARDIOVERSION;  Surgeon: Jerline Pain, MD;  Location: [facility] ENDOSCOPY;  Service: Cardiovascular;  Laterality: N/A;  . EYE SURGERY Bilateral 2005   cataracts, implants  .  KIDNEY STONE SURGERY Bilateral    2016, 2017   . knee replaced Left 2013  . THYROID SURGERY  1966   nodule removal; partial thyroidectomy; radioactive iodine x2; 1990's    There were no vitals filed for this visit.   Subjective Assessment - 04/11/20 1451    Subjective Pt reports her PCP asked her to wear compression hose; however, she's been getting R lateral ankle pain going into the bottom of her foot.                             [facility] Adult PT Treatment/Exercise - 04/11/20 0001      Knee/Hip Exercises: Standing   Gait Training 317' in 4.57 min with rollator MI    Other Standing Knee Exercises counter push-up 2x5      Knee/Hip Exercises: Seated   Sit to Sand without UE support;2 sets;5 reps   30 seconds each     Shoulder Exercises: Seated   Retraction Strengthening;Both;10 reps    External Rotation Strengthening;Both;10 reps;Theraband    Theraband Level (Shoulder External Rotation) Level 2 (Red)    Diagonals --   attempted x 2 but limited due to medial epicondyle pain   Other Seated Exercises serratus anterior punch x 10 with red tband    Other Seated Exercises bicep curl x 10 with red tband, cervical retraction x 10  PT Short Term Goals - 04/09/20 1704      PT SHORT TERM GOAL #1   Title be independent in initial HEP    Baseline reviwed    Status Achieved      PT SHORT TERM GOAL #2   Title walk for 4-5 min without fatigue to assist with community ambulation with rollator    Baseline Pt with SOB noted after walking from lobby to gym    Time 3    Period Weeks    Status Achieved      PT SHORT TERM GOAL #3   Title -    Baseline -      PT SHORT TERM GOAL #4   Title -    Baseline -             PT Long Term Goals - 04/09/20 1705      PT LONG TERM GOAL #1   Title be independent in advanced HEP    Status On-going      PT LONG TERM GOAL #2   Title Pt will improve 5 times sit<>stand to <13 sec for decreased fall  risk    Status On-going      PT LONG TERM GOAL #3   Title Pt will be able to amb with normal reciprocal gait pattern without a/d for ~50'    Status Partially Met      PT LONG TERM GOAL #4   Title Pt will demonstrate at least 4+/5 bilat LE strength bilat    Status Achieved      PT LONG TERM GOAL #5   Title Pt will have improved FOTO score to 31% impairment    Status On-going      PT LONG TERM GOAL #6   Title Pt will be able to amb x 8-10 min with rollator MI without rest break to assist with community ambulation    Baseline 29 sec    Status On-going                 Plan - 04/11/20 1601    Clinical Impression Statement Pt able to ambulate ~5 minutes without rest break for ~317 feet. Treatment continues to focus on endurance, ambulation and standing tolerance. Pt felt most limited by bilat UE feeling tired while pushing rollator. Initiated more UE exercises for pt to be able to improve UE endurance to use rollator.    Personal Factors and Comorbidities Age;Comorbidity 1;Comorbidity 2;Comorbidity 3+    Comorbidities osteoporosis, chronic R knee OA/pain, diabetes with neuropathy    Examination-Activity Limitations Caring for Others;Carry;Lift;Locomotion Level;Squat;Stairs;Stand    Examination-Participation Restrictions Cleaning;Community Activity    Stability/Clinical Decision Making Evolving/Moderate complexity    Rehab Potential Good    PT Frequency 2x / week    PT Treatment/Interventions ADLs/Self Care Home Management;Aquatic Therapy;Electrical Stimulation;Cryotherapy;Iontophoresis '4mg'$ /ml Dexamethasone;Moist Heat;Ultrasound;Gait training;Stair training;Functional mobility training;Therapeutic activities;Therapeutic exercise;Balance training;Neuromuscular re-education;Patient/family education;Manual techniques;Dry needling;Energy conservation;Taping;Vasopneumatic Device    PT Next Visit Plan Continue to progress endurance and bilat LE strengthening and balance. Continue  strengthening UEs for pt to feel less tired pushing rollator with ambulation.    PT Home Exercise Plan Access Code: [email or web address]    Consulted and Agree with Plan of Care Patient           Patient will benefit from skilled therapeutic intervention in order to improve the following deficits and impairments:  Difficulty walking, Cardiopulmonary status limiting activity, Decreased endurance, Decreased activity tolerance, Pain, Decreased balance, Decreased mobility, Decreased strength, Increased edema  Visit  Diagnosis: Difficulty in walking, not elsewhere classified  Muscle weakness (generalized)  Chronic pain of right knee  Other abnormalities of gait and mobility     Problem List Patient Active Problem List   Diagnosis Date Noted  . Atypical atrial flutter ([place])   . Normocytic anemia 05/18/2017  . Chronic ITP (idiopathic thrombocytopenia) (HCC) 01/25/2017  . Chronic kidney disease (CKD) stage G4/A1, severely decreased glomerular filtration rate (GFR) between 15-29 mL/min/1.73 square meter and albuminuria creatinine ratio less than 30 mg/g (HCC) 09/04/2015  . GOUT 06/23/2010  . DERMATITIS 01/16/2010  . BACK PAIN 06/13/2009  . MYALGIA 06/13/2009  . Anxiety state 12/13/2008  . LEG EDEMA, CHRONIC 11/08/2008  . Sleep apnea 10/26/2008  . Hypothyroidism 09/27/2008  . HYPERCHOLESTEROLEMIA 09/27/2008  . OBESITY 09/27/2008  . DEPRESSION 09/27/2008  . Essential hypertension 09/27/2008  . ALLERGIC RHINITIS 09/27/2008  . ASTHMA, CHILDHOOD 09/27/2008    [facility] [address] PT, DPT 04/11/2020, 4:04 PM  [facility] [address] [place], [place], [address] Phone: [phone]   Fax:  [phone]  Name: [name] MRN: [id number] Date of Birth: Apr 03, 1935

## 2020-04-16 ENCOUNTER — Ambulatory Visit: Payer: Medicare Other | Admitting: Physical Therapy

## 2020-04-16 ENCOUNTER — Other Ambulatory Visit: Payer: Self-pay

## 2020-04-16 DIAGNOSIS — R2689 Other abnormalities of gait and mobility: Secondary | ICD-10-CM

## 2020-04-16 DIAGNOSIS — M25561 Pain in right knee: Secondary | ICD-10-CM

## 2020-04-16 DIAGNOSIS — M6281 Muscle weakness (generalized): Secondary | ICD-10-CM

## 2020-04-16 DIAGNOSIS — G8929 Other chronic pain: Secondary | ICD-10-CM

## 2020-04-16 DIAGNOSIS — R262 Difficulty in walking, not elsewhere classified: Secondary | ICD-10-CM

## 2020-04-16 NOTE — Therapy (Signed)
[place], [place], [address] Phone: [phone]   Fax:  [phone]  Physical Therapy Treatment and Re-Certification  Patient Details  Name: [name] MRN: [id number] Date of Birth: 1935-04-25 Referring Provider (PT): Mcarthur Rossetti, MD   Encounter Date: 04/16/2020   PT End of Session - 04/16/20 2220    Visit Number 12    Number of Visits 12    Authorization Type MCR -- needs 10th visit progress note    Progress Note Due on Visit 10    PT Start Time 1450    PT Stop Time 1535    PT Time Calculation (min) 45 min    Activity Tolerance Patient tolerated treatment well    Behavior During Therapy [facility] for tasks assessed/performed           Past Medical History:  Diagnosis Date  . Allergic rhinitis   . Anxiety state, unspecified    panic attacks  . CHF (congestive heart failure) ([place])   . Chronic respiratory failure with hypoxia ([place]) 07/03/2019  . Depressive disorder, not elsewhere classified   . Extrinsic asthma, unspecified    no problem since adulthood  . HCAP (healthcare-associated pneumonia)   . Obesity   . OSA on CPAP    severe  . Pneumonitis 07/14/2019  . Pressure injury of skin 07/10/2019  . Pure hypercholesterolemia   . Respiratory failure ([place]) 08/24/2015  . Respiratory failure with hypoxia ([place]) 09/2008   acute, secondary to multiple bilateral pulmonary embolism , negative hypercoagulable workup 09/2008 hospital stay  . Scoliosis   . Type II or unspecified type diabetes mellitus without mention of complication, not stated as uncontrolled   . Unspecified essential hypertension   . Unspecified hypothyroidism    hypo    Past Surgical History:  Procedure Laterality Date  . CARDIOVERSION N/A 06/26/2019   Procedure: CARDIOVERSION;  Surgeon: Jerline Pain, MD;  Location: [facility] ENDOSCOPY;  Service: Cardiovascular;  Laterality: N/A;  . EYE SURGERY Bilateral 2005    cataracts, implants  . KIDNEY STONE SURGERY Bilateral    2016, 2017   . knee replaced Left 2013  . THYROID SURGERY  1966   nodule removal; partial thyroidectomy; radioactive iodine x2; 1990's    There were no vitals filed for this visit.   Subjective Assessment - 04/16/20 1455    Subjective Pt states she had a good weekend. Pt states that the doctor told her to not wear the hose.    Limitations Walking;Lifting;House hold activities    How long can you walk comfortably? Needs rest breaks; limited due to respiratory status    Patient Stated Goals Return back to PLOF; less reliant on rollator    Currently in Pain? No/denies              [facility] PT Assessment - 04/16/20 0001      Observation/Other Assessments   Focus on Therapeutic Outcomes (FOTO)  41%                         [facility] Adult PT Treatment/Exercise - 04/16/20 0001      Knee/Hip Exercises: Standing   Hip Abduction Stengthening;Both;10 reps    Abduction Limitations red t-band    Hip Extension Stengthening;Both;10 reps    Extension Limitations red t-band    Gait Training in parallel bars: 4x20' (cues to keep hips forward and take bigger steps with R LE)    Other Standing Knee  Exercises tandem stance 2x 30 sec bilat, marching x 10 bilat                  PT Education - 04/16/20 2219    Education Details Discussed FOTO score, PT goals, and PT re-evaluation    Person(s) Educated Patient    Methods Explanation;Demonstration;Tactile cues;Verbal cues    Comprehension Verbalized understanding;Returned demonstration            PT Short Term Goals - 04/09/20 1704      PT SHORT TERM GOAL #1   Title be independent in initial HEP    Baseline reviwed    Status Achieved      PT SHORT TERM GOAL #2   Title walk for 4-5 min without fatigue to assist with community ambulation with rollator    Baseline Pt with SOB noted after walking from lobby to gym    Time 3    Period Weeks    Status Achieved       PT SHORT TERM GOAL #3   Title -    Baseline -      PT SHORT TERM GOAL #4   Title -    Baseline -             PT Long Term Goals - 04/16/20 1525      PT LONG TERM GOAL #1   Title be independent in advanced HEP    Status Achieved      PT LONG TERM GOAL #2   Title Pt will improve 5 times sit<>stand to <13 sec for decreased fall risk    Baseline 16 sec    Time 6    Period Weeks    Status On-going    Target Date 05/28/20      PT LONG TERM GOAL #3   Title Pt will be able to amb with normal reciprocal gait pattern without a/d for ~50'    Status Achieved      PT LONG TERM GOAL #4   Title Pt will demonstrate at least 4+/5 bilat LE strength bilat    Status Achieved      PT LONG TERM GOAL #5   Title Pt will have improved FOTO score to 31% impairment    Time 6    Period Weeks    Status On-going    Target Date 05/28/20      PT LONG TERM GOAL #6   Title Pt will be able to amb x 8-10 min with rollator MI without rest break to assist with community ambulation    Baseline 5 minutes    Time 6    Period Weeks    Status On-going    Target Date 05/28/20                 Plan - 04/16/20 2220    Clinical Impression Statement PT re-evaluation performed. Pt is slowly meeting goals. Pt able to tolerate ambulation for 5 minutes without rest. Pt has been able to demonstrate ambulation without a/d for ~50' with only mild gait deviation (able to improve with tactile and verbal cueing). Pt with increasing overall strength and balance. Pt would benefit from continued therapy to optimize her level of function and meet her goals. Pt's FOTO score has not improved; however, pt attributes this to the fact that in the beginning of PT she was not doing anything (ex housework) that was difficult and letting others perform these tasks. She reports she is doing more now and challenging  herself.    Personal Factors and Comorbidities Age;Comorbidity 1;Comorbidity 2;Comorbidity 3+    Comorbidities  osteoporosis, chronic R knee OA/pain, diabetes with neuropathy    Examination-Activity Limitations Caring for Others;Carry;Lift;Locomotion Level;Squat;Stairs;Stand    Examination-Participation Restrictions Cleaning;Community Activity    Stability/Clinical Decision Making Evolving/Moderate complexity    Rehab Potential Good    PT Frequency 2x / week    PT Duration 6 weeks    PT Treatment/Interventions ADLs/Self Care Home Management;Aquatic Therapy;Electrical Stimulation;Cryotherapy;Iontophoresis 4mg /ml Dexamethasone;Moist Heat;Ultrasound;Gait training;Stair training;Functional mobility training;Therapeutic activities;Therapeutic exercise;Balance training;Neuromuscular re-education;Patient/family education;Manual techniques;Dry needling;Energy conservation;Taping;Vasopneumatic Device    PT Next Visit Plan Continue to progress endurance and bilat LE strengthening and balance. Continue strengthening UEs for pt to feel less tired pushing rollator with ambulation.    PT Home Exercise Plan Access Code: [email or web address]    Consulted and Agree with Plan of Care Patient           Patient will benefit from skilled therapeutic intervention in order to improve the following deficits and impairments:  Difficulty walking, Cardiopulmonary status limiting activity, Decreased endurance, Decreased activity tolerance, Pain, Decreased balance, Decreased mobility, Decreased strength, Increased edema  Visit Diagnosis: Difficulty in walking, not elsewhere classified  Muscle weakness (generalized)  Chronic pain of right knee  Other abnormalities of gait and mobility     Problem List Patient Active Problem List   Diagnosis Date Noted  . Atypical atrial flutter ([place])   . Normocytic anemia 05/18/2017  . Chronic ITP (idiopathic thrombocytopenia) (HCC) 01/25/2017  . Chronic kidney disease (CKD) stage G4/A1, severely decreased glomerular filtration rate (GFR) between 15-29 mL/min/1.73 square meter and albuminuria  creatinine ratio less than 30 mg/g (HCC) 09/04/2015  . GOUT 06/23/2010  . DERMATITIS 01/16/2010  . BACK PAIN 06/13/2009  . MYALGIA 06/13/2009  . Anxiety state 12/13/2008  . LEG EDEMA, CHRONIC 11/08/2008  . Sleep apnea 10/26/2008  . Hypothyroidism 09/27/2008  . HYPERCHOLESTEROLEMIA 09/27/2008  . OBESITY 09/27/2008  . DEPRESSION 09/27/2008  . Essential hypertension 09/27/2008  . ALLERGIC RHINITIS 09/27/2008  . ASTHMA, CHILDHOOD 09/27/2008    [facility] [address] PT, DPT 04/16/2020, 10:28 PM  [facility] [address] [place], [place], [address] Phone: [phone]   Fax:  [phone]  Name: Katelen Luepke MRN: [id number] Date of Birth: [birth year]

## 2020-04-18 ENCOUNTER — Other Ambulatory Visit: Payer: Self-pay

## 2020-04-18 ENCOUNTER — Ambulatory Visit: Payer: Medicare Other | Admitting: Physical Therapy

## 2020-04-18 DIAGNOSIS — M6281 Muscle weakness (generalized): Secondary | ICD-10-CM

## 2020-04-18 DIAGNOSIS — R262 Difficulty in walking, not elsewhere classified: Secondary | ICD-10-CM

## 2020-04-18 DIAGNOSIS — M25561 Pain in right knee: Secondary | ICD-10-CM

## 2020-04-18 DIAGNOSIS — G8929 Other chronic pain: Secondary | ICD-10-CM

## 2020-04-18 DIAGNOSIS — R2689 Other abnormalities of gait and mobility: Secondary | ICD-10-CM

## 2020-04-18 NOTE — Therapy (Signed)
[place], [place], [address] Phone: [phone]   Fax:  [phone]  Physical Therapy Treatment  Patient Details  Name: [name] MRN: [id number] Date of Birth: Aug 16, 1935 Referring Provider (PT): Mcarthur Rossetti, MD   Encounter Date: 04/18/2020   PT End of Session - 04/18/20 1533    Visit Number 13    Number of Visits 20    Authorization Type MCR -- needs 10th visit progress note    Progress Note Due on Visit 10    PT Start Time 1450    PT Stop Time 1530    PT Time Calculation (min) 40 min    Activity Tolerance Patient tolerated treatment well    Behavior During Therapy [facility] for tasks assessed/performed           Past Medical History:  Diagnosis Date  . Allergic rhinitis   . Anxiety state, unspecified    panic attacks  . CHF (congestive heart failure) ([place])   . Chronic respiratory failure with hypoxia ([place]) 07/03/2019  . Depressive disorder, not elsewhere classified   . Extrinsic asthma, unspecified    no problem since adulthood  . HCAP (healthcare-associated pneumonia)   . Obesity   . OSA on CPAP    severe  . Pneumonitis 07/14/2019  . Pressure injury of skin 07/10/2019  . Pure hypercholesterolemia   . Respiratory failure ([place]) 08/24/2015  . Respiratory failure with hypoxia ([place]) 09/2008   acute, secondary to multiple bilateral pulmonary embolism , negative hypercoagulable workup 09/2008 hospital stay  . Scoliosis   . Type II or unspecified type diabetes mellitus without mention of complication, not stated as uncontrolled   . Unspecified essential hypertension   . Unspecified hypothyroidism    hypo    Past Surgical History:  Procedure Laterality Date  . CARDIOVERSION N/A 06/26/2019   Procedure: CARDIOVERSION;  Surgeon: Jerline Pain, MD;  Location: [facility] ENDOSCOPY;  Service: Cardiovascular;  Laterality: N/A;  . EYE SURGERY Bilateral 2005   cataracts, implants  .  KIDNEY STONE SURGERY Bilateral    2016, 2017   . knee replaced Left 2013  . THYROID SURGERY  1966   nodule removal; partial thyroidectomy; radioactive iodine x2; 1990's    There were no vitals filed for this visit.   Subjective Assessment - 04/18/20 1450    Subjective Pt reports that she went to the chiropractor and he worked on the side of her R foot and she reports it feels better.    Limitations Walking;Lifting;House hold activities    How long can you walk comfortably? Needs rest breaks; limited due to respiratory status    Patient Stated Goals Return back to PLOF; less reliant on rollator    Currently in Pain? No/denies                             [facility] Adult PT Treatment/Exercise - 04/18/20 0001      Knee/Hip Exercises: Aerobic   Other Aerobic 6 min walking with rollator, 1 sitting rest break. 440'      Shoulder Exercises: Seated   Row Strengthening;Both;20 reps;Theraband    Theraband Level (Shoulder Row) Level 2 (Red)    External Rotation Strengthening;Both;10 reps;Theraband    Theraband Level (Shoulder External Rotation) Level 2 (Red)    Other Seated Exercises Bicep curl x 10 on L with 3#; isometric hold on R with 3# weit    Other Seated Exercises  serratus anterior punch with red tband 2x10                    PT Short Term Goals - 04/09/20 1704      PT SHORT TERM GOAL #1   Title be independent in initial HEP    Baseline reviwed    Status Achieved      PT SHORT TERM GOAL #2   Title walk for 4-5 min without fatigue to assist with community ambulation with rollator    Baseline Pt with SOB noted after walking from lobby to gym    Time 3    Period Weeks    Status Achieved      PT SHORT TERM GOAL #3   Title -    Baseline -      PT SHORT TERM GOAL #4   Title -    Baseline -             PT Long Term Goals - 04/16/20 1525      PT LONG TERM GOAL #1   Title be independent in advanced HEP    Status Achieved      PT LONG TERM  GOAL #2   Title Pt will improve 5 times sit<>stand to <13 sec for decreased fall risk    Baseline 16 sec    Time 6    Period Weeks    Status On-going    Target Date 05/28/20      PT LONG TERM GOAL #3   Title Pt will be able to amb with normal reciprocal gait pattern without a/d for ~50'    Status Achieved      PT LONG TERM GOAL #4   Title Pt will demonstrate at least 4+/5 bilat LE strength bilat    Status Achieved      PT LONG TERM GOAL #5   Title Pt will have improved FOTO score to 31% impairment    Time 6    Period Weeks    Status On-going    Target Date 05/28/20      PT LONG TERM GOAL #6   Title Pt will be able to amb x 8-10 min with rollator MI without rest break to assist with community ambulation    Baseline 5 minutes    Time 6    Period Weeks    Status On-going    Target Date 05/28/20                 Plan - 04/18/20 1535    Clinical Impression Statement Treatment focused on endurance and improving pt's bilat UE strength so she is able to push her rollator for longer periods. Provided pt with more exercises for UEs. Pt tolerated treatment well.    Personal Factors and Comorbidities Age;Comorbidity 1;Comorbidity 2;Comorbidity 3+    Comorbidities osteoporosis, chronic R knee OA/pain, diabetes with neuropathy    Examination-Activity Limitations Caring for Others;Carry;Lift;Locomotion Level;Squat;Stairs;Stand    Examination-Participation Restrictions Cleaning;Community Activity    Stability/Clinical Decision Making Evolving/Moderate complexity    Rehab Potential Good    PT Frequency 2x / week    PT Duration 6 weeks    PT Treatment/Interventions ADLs/Self Care Home Management;Aquatic Therapy;Electrical Stimulation;Cryotherapy;Iontophoresis 4mg /ml Dexamethasone;Moist Heat;Ultrasound;Gait training;Stair training;Functional mobility training;Therapeutic activities;Therapeutic exercise;Balance training;Neuromuscular re-education;Patient/family education;Manual  techniques;Dry needling;Energy conservation;Taping;Vasopneumatic Device    PT Next Visit Plan Continue to progress endurance and bilat LE strengthening and balance. Continue strengthening UEs for pt to feel less tired pushing rollator with ambulation.  PT Home Exercise Plan Access Code: [email or web address]    Consulted and Agree with Plan of Care Patient           Patient will benefit from skilled therapeutic intervention in order to improve the following deficits and impairments:  Difficulty walking, Cardiopulmonary status limiting activity, Decreased endurance, Decreased activity tolerance, Pain, Decreased balance, Decreased mobility, Decreased strength, Increased edema  Visit Diagnosis: Difficulty in walking, not elsewhere classified  Muscle weakness (generalized)  Chronic pain of right knee  Other abnormalities of gait and mobility     Problem List Patient Active Problem List   Diagnosis Date Noted  . Atypical atrial flutter ([place])   . Normocytic anemia 05/18/2017  . Chronic ITP (idiopathic thrombocytopenia) (HCC) 01/25/2017  . Chronic kidney disease (CKD) stage G4/A1, severely decreased glomerular filtration rate (GFR) between 15-29 mL/min/1.73 square meter and albuminuria creatinine ratio less than 30 mg/g (HCC) 09/04/2015  . GOUT 06/23/2010  . DERMATITIS 01/16/2010  . BACK PAIN 06/13/2009  . MYALGIA 06/13/2009  . Anxiety state 12/13/2008  . LEG EDEMA, CHRONIC 11/08/2008  . Sleep apnea 10/26/2008  . Hypothyroidism 09/27/2008  . HYPERCHOLESTEROLEMIA 09/27/2008  . OBESITY 09/27/2008  . DEPRESSION 09/27/2008  . Essential hypertension 09/27/2008  . ALLERGIC RHINITIS 09/27/2008  . ASTHMA, CHILDHOOD 09/27/2008    [facility] [address] PT, DPT 04/18/2020, 3:40 PM  [facility] [address] [place], [place], [address] Phone: [phone]   Fax:  [phone]  Name: [name] MRN: [id number] Date of Birth:  16-Jun-1935

## 2020-04-23 ENCOUNTER — Ambulatory Visit: Payer: Medicare Other | Admitting: Physical Therapy

## 2020-04-23 ENCOUNTER — Other Ambulatory Visit: Payer: Self-pay

## 2020-04-23 DIAGNOSIS — R2689 Other abnormalities of gait and mobility: Secondary | ICD-10-CM

## 2020-04-23 DIAGNOSIS — G8929 Other chronic pain: Secondary | ICD-10-CM

## 2020-04-23 DIAGNOSIS — R262 Difficulty in walking, not elsewhere classified: Secondary | ICD-10-CM

## 2020-04-23 DIAGNOSIS — M6281 Muscle weakness (generalized): Secondary | ICD-10-CM

## 2020-04-23 NOTE — Therapy (Signed)
[place], [place], [address] Phone: [phone]   Fax:  [phone]  Physical Therapy Treatment  Patient Details  Name: [name] MRN: [id number] Date of Birth: October 10, 1934 Referring Provider (PT): Mcarthur Rossetti, MD   Encounter Date: 04/23/2020   PT End of Session - 04/23/20 1453    Visit Number 14    PT Start Time 3016    PT Stop Time 0109    PT Time Calculation (min) 43 min    Activity Tolerance Patient tolerated treatment well    Behavior During Therapy [facility] for tasks assessed/performed           Past Medical History:  Diagnosis Date  . Allergic rhinitis   . Anxiety state, unspecified    panic attacks  . CHF (congestive heart failure) ([place])   . Chronic respiratory failure with hypoxia ([place]) 07/03/2019  . Depressive disorder, not elsewhere classified   . Extrinsic asthma, unspecified    no problem since adulthood  . HCAP (healthcare-associated pneumonia)   . Obesity   . OSA on CPAP    severe  . Pneumonitis 07/14/2019  . Pressure injury of skin 07/10/2019  . Pure hypercholesterolemia   . Respiratory failure ([place]) 08/24/2015  . Respiratory failure with hypoxia ([place]) 09/2008   acute, secondary to multiple bilateral pulmonary embolism , negative hypercoagulable workup 09/2008 hospital stay  . Scoliosis   . Type II or unspecified type diabetes mellitus without mention of complication, not stated as uncontrolled   . Unspecified essential hypertension   . Unspecified hypothyroidism    hypo    Past Surgical History:  Procedure Laterality Date  . CARDIOVERSION N/A 06/26/2019   Procedure: CARDIOVERSION;  Surgeon: Jerline Pain, MD;  Location: [facility] ENDOSCOPY;  Service: Cardiovascular;  Laterality: N/A;  . EYE SURGERY Bilateral 2005   cataracts, implants  . KIDNEY STONE SURGERY Bilateral    2016, 2017   . knee replaced Left 2013  . THYROID SURGERY  1966   nodule removal;  partial thyroidectomy; radioactive iodine x2; 1990's    There were no vitals filed for this visit.   Subjective Assessment - 04/23/20 1452    Subjective Pt reports some R elbow pain after performing HEP. She no longer complains of any ankle pain.    Limitations Walking;Lifting;House hold activities    How long can you walk comfortably? Needs rest breaks; limited due to respiratory status    Patient Stated Goals Return back to PLOF; less reliant on rollator                             [facility] Adult PT Treatment/Exercise - 04/23/20 0001      Knee/Hip Exercises: Aerobic   Nustep L5 x 5 min      Knee/Hip Exercises: Machines for Strengthening   Total Gym Leg Press 45# x 10, 55# 2x 10      Knee/Hip Exercises: Standing   Gait Training in parallel bars: 6x20' (cues to keep hips and foot forward and take bigger steps with R LE)    Other Standing Knee Exercises Palloff press x10 red tband bilat    Other Standing Knee Exercises shoulder extension x 10 red tband                    PT Short Term Goals - 04/09/20 1704      PT SHORT TERM GOAL #1   Title  be independent in initial HEP    Baseline reviwed    Status Achieved      PT SHORT TERM GOAL #2   Title walk for 4-5 min without fatigue to assist with community ambulation with rollator    Baseline Pt with SOB noted after walking from lobby to gym    Time 3    Period Weeks    Status Achieved      PT SHORT TERM GOAL #3   Title -    Baseline -      PT SHORT TERM GOAL #4   Title -    Baseline -             PT Long Term Goals - 04/16/20 1525      PT LONG TERM GOAL #1   Title be independent in advanced HEP    Status Achieved      PT LONG TERM GOAL #2   Title Pt will improve 5 times sit<>stand to <13 sec for decreased fall risk    Baseline 16 sec    Time 6    Period Weeks    Status On-going    Target Date 05/28/20      PT LONG TERM GOAL #3   Title Pt will be able to amb with normal reciprocal  gait pattern without a/d for ~50'    Status Achieved      PT LONG TERM GOAL #4   Title Pt will demonstrate at least 4+/5 bilat LE strength bilat    Status Achieved      PT LONG TERM GOAL #5   Title Pt will have improved FOTO score to 31% impairment    Time 6    Period Weeks    Status On-going    Target Date 05/28/20      PT LONG TERM GOAL #6   Title Pt will be able to amb x 8-10 min with rollator MI without rest break to assist with community ambulation    Baseline 5 minutes    Time 6    Period Weeks    Status On-going    Target Date 05/28/20                 Plan - 04/23/20 1453    Clinical Impression Statement Treatment focused on continued LE and UE strengthening. Continued work with pt's endurance and gait quality ambulating without a/d.    Personal Factors and Comorbidities Age;Comorbidity 1;Comorbidity 2;Comorbidity 3+    Comorbidities osteoporosis, chronic R knee OA/pain, diabetes with neuropathy    Examination-Activity Limitations Caring for Others;Carry;Lift;Locomotion Level;Squat;Stairs;Stand    Examination-Participation Restrictions Cleaning;Community Activity    Stability/Clinical Decision Making Evolving/Moderate complexity    Rehab Potential Good    PT Frequency 2x / week    PT Duration 6 weeks    PT Treatment/Interventions ADLs/Self Care Home Management;Aquatic Therapy;Electrical Stimulation;Cryotherapy;Iontophoresis 4mg /ml Dexamethasone;Moist Heat;Ultrasound;Gait training;Stair training;Functional mobility training;Therapeutic activities;Therapeutic exercise;Balance training;Neuromuscular re-education;Patient/family education;Manual techniques;Dry needling;Energy conservation;Taping;Vasopneumatic Device    PT Next Visit Plan Continue to progress endurance and bilat LE strengthening and balance. Continue strengthening UEs for pt to feel less tired pushing rollator with ambulation.    PT Home Exercise Plan Access Code: [email or web address]    Consulted and Agree with Plan  of Care Patient           Patient will benefit from skilled therapeutic intervention in order to improve the following deficits and impairments:  Difficulty walking, Cardiopulmonary status limiting activity, Decreased endurance, Decreased activity tolerance, Pain, Decreased  balance, Decreased mobility, Decreased strength, Increased edema  Visit Diagnosis: Difficulty in walking, not elsewhere classified  Muscle weakness (generalized)  Chronic pain of right knee  Other abnormalities of gait and mobility     Problem List Patient Active Problem List   Diagnosis Date Noted  . Atypical atrial flutter ([place])   . Normocytic anemia 05/18/2017  . Chronic ITP (idiopathic thrombocytopenia) (HCC) 01/25/2017  . Chronic kidney disease (CKD) stage G4/A1, severely decreased glomerular filtration rate (GFR) between 15-29 mL/min/1.73 square meter and albuminuria creatinine ratio less than 30 mg/g (HCC) 09/04/2015  . GOUT 06/23/2010  . DERMATITIS 01/16/2010  . BACK PAIN 06/13/2009  . MYALGIA 06/13/2009  . Anxiety state 12/13/2008  . LEG EDEMA, CHRONIC 11/08/2008  . Sleep apnea 10/26/2008  . Hypothyroidism 09/27/2008  . HYPERCHOLESTEROLEMIA 09/27/2008  . OBESITY 09/27/2008  . DEPRESSION 09/27/2008  . Essential hypertension 09/27/2008  . ALLERGIC RHINITIS 09/27/2008  . ASTHMA, CHILDHOOD 09/27/2008    [facility] [address] PT, DPT 04/23/2020, 3:33 PM  [facility] [address] [place], [place], [address] Phone: [phone]   Fax:  [phone]  Name: [name] MRN: [id number] Date of Birth: December 23, 1934

## 2020-04-25 ENCOUNTER — Ambulatory Visit: Payer: Medicare Other | Admitting: Physical Therapy

## 2020-04-25 ENCOUNTER — Telehealth: Payer: Self-pay | Admitting: *Deleted

## 2020-04-25 ENCOUNTER — Other Ambulatory Visit: Payer: Self-pay

## 2020-04-25 DIAGNOSIS — R2689 Other abnormalities of gait and mobility: Secondary | ICD-10-CM

## 2020-04-25 DIAGNOSIS — G8929 Other chronic pain: Secondary | ICD-10-CM

## 2020-04-25 DIAGNOSIS — M25561 Pain in right knee: Secondary | ICD-10-CM

## 2020-04-25 DIAGNOSIS — R262 Difficulty in walking, not elsewhere classified: Secondary | ICD-10-CM

## 2020-04-25 DIAGNOSIS — M6281 Muscle weakness (generalized): Secondary | ICD-10-CM

## 2020-04-25 NOTE — Telephone Encounter (Signed)
-----   Message from Caren Macadam, MD sent at 04/25/2020  1:32 PM EDT ----- Please let patient know that Dr. Cruzita Lederer agreed to add on my labs at her office when patient is there. This way she can just get one blood draw. Just have them remind her when she is there (and thank her ;) ----- Message ----- From: Philemon Kingdom, MD Sent: 04/22/2020   1:40 PM EDT To: Caren Macadam, MD  Hi Junell, I can definitely try to get these.  I usually have to reorder them since the orders placed at other offices are not being picked up at my office, and I am hoping this is not a problem for the insurance.   Salena Saner ----- Message ----- From: Caren Macadam, MD Sent: 04/07/2020   5:07 PM EDT To: Philemon Kingdom, MD  She is a new patient for me; certainly has been through a lot recently! She is doing remarkably well right now and really feeling strong, doing active PT; walking independently, etc. Just wanted to see if you would be able to get bloodwork I ordered when she is at your office? If not, no worries and nothing urgent, but just included some things I thought would be worth checking and she sees you in a month. Thanks! -Andris Flurry

## 2020-04-25 NOTE — Therapy (Signed)
[place], [place], [address] Phone: [phone]   Fax:  [phone]  Physical Therapy Treatment  Patient Details  Name: [name] MRN: [id number] Date of Birth: 1935-04-16 Referring Provider (PT): Mcarthur Rossetti, MD   Encounter Date: 04/25/2020   PT End of Session - 04/25/20 1457    Visit Number 15    Number of Visits 24    Date for PT Re-Evaluation 05/28/20    PT Start Time 1450    PT Stop Time 1538    PT Time Calculation (min) 48 min    Activity Tolerance Patient tolerated treatment well    Behavior During Therapy [facility] for tasks assessed/performed           Past Medical History:  Diagnosis Date  . Allergic rhinitis   . Anxiety state, unspecified    panic attacks  . CHF (congestive heart failure) ([place])   . Chronic respiratory failure with hypoxia ([place]) 07/03/2019  . Depressive disorder, not elsewhere classified   . Extrinsic asthma, unspecified    no problem since adulthood  . HCAP (healthcare-associated pneumonia)   . Obesity   . OSA on CPAP    severe  . Pneumonitis 07/14/2019  . Pressure injury of skin 07/10/2019  . Pure hypercholesterolemia   . Respiratory failure ([place]) 08/24/2015  . Respiratory failure with hypoxia ([place]) 09/2008   acute, secondary to multiple bilateral pulmonary embolism , negative hypercoagulable workup 09/2008 hospital stay  . Scoliosis   . Type II or unspecified type diabetes mellitus without mention of complication, not stated as uncontrolled   . Unspecified essential hypertension   . Unspecified hypothyroidism    hypo    Past Surgical History:  Procedure Laterality Date  . CARDIOVERSION N/A 06/26/2019   Procedure: CARDIOVERSION;  Surgeon: Jerline Pain, MD;  Location: [facility] ENDOSCOPY;  Service: Cardiovascular;  Laterality: N/A;  . EYE SURGERY Bilateral 2005   cataracts, implants  . KIDNEY STONE SURGERY Bilateral    2016, 2017   . knee  replaced Left 2013  . THYROID SURGERY  1966   nodule removal; partial thyroidectomy; radioactive iodine x2; 1990's    There were no vitals filed for this visit.   Subjective Assessment - 04/25/20 1455    Subjective Pt reports no issues. She states she wasn't sore after last session.    Limitations Walking;Lifting;House hold activities    How long can you walk comfortably? Needs rest breaks; limited due to respiratory status    Patient Stated Goals Return back to PLOF; less reliant on rollator                             [facility] Adult PT Treatment/Exercise - 04/25/20 0001      Knee/Hip Exercises: Aerobic   Nustep L6 x 6 min      Knee/Hip Exercises: Standing   Gait Training in parallel bars: amb 1 min x 3 sets,    ~50' per minute   Other Standing Knee Exercises Marching with red tband 3 x 1 min                  PT Education - 04/25/20 1535    Education Details Discussed breaking up exercises at home to upper body strengthening day, lower body strengthening, and then endurance. Discussed interval training and increasing the intervals.    Person(s) Educated Patient    Methods Explanation;Demonstration;Tactile cues;Verbal cues  Comprehension Verbalized understanding;Returned demonstration;Verbal cues required;Tactile cues required            PT Short Term Goals - 04/09/20 1704      PT SHORT TERM GOAL #1   Title be independent in initial HEP    Baseline reviwed    Status Achieved      PT SHORT TERM GOAL #2   Title walk for 4-5 min without fatigue to assist with community ambulation with rollator    Baseline Pt with SOB noted after walking from lobby to gym    Time 3    Period Weeks    Status Achieved      PT SHORT TERM GOAL #3   Title -    Baseline -      PT SHORT TERM GOAL #4   Title -    Baseline -             PT Long Term Goals - 04/16/20 1525      PT LONG TERM GOAL #1   Title be independent in advanced HEP    Status Achieved        PT LONG TERM GOAL #2   Title Pt will improve 5 times sit<>stand to <13 sec for decreased fall risk    Baseline 16 sec    Time 6    Period Weeks    Status On-going    Target Date 05/28/20      PT LONG TERM GOAL #3   Title Pt will be able to amb with normal reciprocal gait pattern without a/d for ~50'    Status Achieved      PT LONG TERM GOAL #4   Title Pt will demonstrate at least 4+/5 bilat LE strength bilat    Status Achieved      PT LONG TERM GOAL #5   Title Pt will have improved FOTO score to 31% impairment    Time 6    Period Weeks    Status On-going    Target Date 05/28/20      PT LONG TERM GOAL #6   Title Pt will be able to amb x 8-10 min with rollator MI without rest break to assist with community ambulation    Baseline 5 minutes    Time 6    Period Weeks    Status On-going    Target Date 05/28/20                 Plan - 04/25/20 1515    Clinical Impression Statement Treatment focused on improving pt's endurance. Pt able to tolerate amb 50' in ~1 min without a/d. HR at most ~120s, spO2 88 to 96%. Pt requires multiple rest breaks.    Personal Factors and Comorbidities Age;Comorbidity 1;Comorbidity 2;Comorbidity 3+    Comorbidities osteoporosis, chronic R knee OA/pain, diabetes with neuropathy    Examination-Activity Limitations Caring for Others;Carry;Lift;Locomotion Level;Squat;Stairs;Stand    Examination-Participation Restrictions Cleaning;Community Activity    Stability/Clinical Decision Making Evolving/Moderate complexity    Rehab Potential Good    PT Frequency 2x / week    PT Duration 6 weeks    PT Treatment/Interventions ADLs/Self Care Home Management;Aquatic Therapy;Electrical Stimulation;Cryotherapy;Iontophoresis 4mg /ml Dexamethasone;Moist Heat;Ultrasound;Gait training;Stair training;Functional mobility training;Therapeutic activities;Therapeutic exercise;Balance training;Neuromuscular re-education;Patient/family education;Manual techniques;Dry  needling;Energy conservation;Taping;Vasopneumatic Device    PT Next Visit Plan Continue to progress endurance and bilat LE strengthening and balance. Continue strengthening UEs for pt to feel less tired pushing rollator with ambulation.    PT Home Exercise Plan Access Code: [email or web address]  Consulted and Agree with Plan of Care Patient           Patient will benefit from skilled therapeutic intervention in order to improve the following deficits and impairments:  Difficulty walking, Cardiopulmonary status limiting activity, Decreased endurance, Decreased activity tolerance, Pain, Decreased balance, Decreased mobility, Decreased strength, Increased edema  Visit Diagnosis: Difficulty in walking, not elsewhere classified  Muscle weakness (generalized)  Chronic pain of right knee  Other abnormalities of gait and mobility     Problem List Patient Active Problem List   Diagnosis Date Noted  . Atypical atrial flutter ([place])   . Normocytic anemia 05/18/2017  . Chronic ITP (idiopathic thrombocytopenia) (HCC) 01/25/2017  . Chronic kidney disease (CKD) stage G4/A1, severely decreased glomerular filtration rate (GFR) between 15-29 mL/min/1.73 square meter and albuminuria creatinine ratio less than 30 mg/g (HCC) 09/04/2015  . GOUT 06/23/2010  . DERMATITIS 01/16/2010  . BACK PAIN 06/13/2009  . MYALGIA 06/13/2009  . Anxiety state 12/13/2008  . LEG EDEMA, CHRONIC 11/08/2008  . Sleep apnea 10/26/2008  . Hypothyroidism 09/27/2008  . HYPERCHOLESTEROLEMIA 09/27/2008  . OBESITY 09/27/2008  . DEPRESSION 09/27/2008  . Essential hypertension 09/27/2008  . ALLERGIC RHINITIS 09/27/2008  . ASTHMA, CHILDHOOD 09/27/2008    [facility] [address] PT, DPT 04/25/2020, 3:47 PM  [facility] [address] [place], [place], [address] Phone: [phone]   Fax:  [phone]  Name: Kimberla Driskill MRN: [id number] Date of Birth: 05-03-1935

## 2020-04-25 NOTE — Telephone Encounter (Signed)
Left a detailed message at the pts home number with the information below. 

## 2020-05-02 ENCOUNTER — Ambulatory Visit: Payer: Medicare Other | Admitting: Physical Therapy

## 2020-05-03 ENCOUNTER — Other Ambulatory Visit: Payer: Self-pay

## 2020-05-03 ENCOUNTER — Inpatient Hospital Stay: Payer: Medicare Other

## 2020-05-03 ENCOUNTER — Encounter: Payer: Self-pay | Admitting: Family

## 2020-05-03 ENCOUNTER — Inpatient Hospital Stay (HOSPITAL_BASED_OUTPATIENT_CLINIC_OR_DEPARTMENT_OTHER): Payer: Medicare Other | Admitting: Family

## 2020-05-03 ENCOUNTER — Inpatient Hospital Stay: Payer: Medicare Other | Attending: Hematology & Oncology

## 2020-05-03 VITALS — BP 118/83 | HR 80 | Temp 97.9°F | Resp 18 | Ht 61.0 in | Wt 219.1 lb

## 2020-05-03 DIAGNOSIS — D500 Iron deficiency anemia secondary to blood loss (chronic): Secondary | ICD-10-CM

## 2020-05-03 DIAGNOSIS — D693 Immune thrombocytopenic purpura: Secondary | ICD-10-CM

## 2020-05-03 DIAGNOSIS — Z7901 Long term (current) use of anticoagulants: Secondary | ICD-10-CM | POA: Insufficient documentation

## 2020-05-03 DIAGNOSIS — D509 Iron deficiency anemia, unspecified: Secondary | ICD-10-CM | POA: Insufficient documentation

## 2020-05-03 DIAGNOSIS — I4891 Unspecified atrial fibrillation: Secondary | ICD-10-CM | POA: Diagnosis not present

## 2020-05-03 DIAGNOSIS — Z86711 Personal history of pulmonary embolism: Secondary | ICD-10-CM | POA: Diagnosis not present

## 2020-05-03 LAB — CMP (CANCER CENTER ONLY)
ALT: 16 U/L (ref 0–44)
AST: 15 U/L (ref 15–41)
Albumin: 4.3 g/dL (ref 3.5–5.0)
Alkaline Phosphatase: 68 U/L (ref 38–126)
Anion gap: 10 (ref 5–15)
BUN: 54 mg/dL — ABNORMAL HIGH (ref 8–23)
CO2: 32 mmol/L (ref 22–32)
Calcium: 9.9 mg/dL (ref 8.9–10.3)
Chloride: 101 mmol/L (ref 98–111)
Creatinine: 1.63 mg/dL — ABNORMAL HIGH (ref 0.44–1.00)
GFR, Est AFR Am: 34 mL/min — ABNORMAL LOW (ref >60)
GFR, Estimated: 29 mL/min — ABNORMAL LOW (ref >60)
Glucose, Bld: 218 mg/dL — ABNORMAL HIGH (ref 70–99)
Potassium: 3.9 mmol/L (ref 3.5–5.1)
Sodium: 143 mmol/L (ref 135–145)
Total Bilirubin: 0.8 mg/dL (ref 0.3–1.2)
Total Protein: 7.4 g/dL (ref 6.5–8.1)

## 2020-05-03 LAB — CBC WITH DIFFERENTIAL (CANCER CENTER ONLY)
Abs Immature Granulocytes: 0.02 10*3/uL (ref 0.00–0.07)
Basophils Absolute: 0 10*3/uL (ref 0.0–0.1)
Basophils Relative: 0 %
Eosinophils Absolute: 0.2 10*3/uL (ref 0.0–0.5)
Eosinophils Relative: 3 %
HCT: 33.7 % — ABNORMAL LOW (ref 36.0–46.0)
Hemoglobin: 11.2 g/dL — ABNORMAL LOW (ref 12.0–15.0)
Immature Granulocytes: 0 %
Lymphocytes Relative: 20 %
Lymphs Abs: 1 10*3/uL (ref 0.7–4.0)
MCH: 31.3 pg (ref 26.0–34.0)
MCHC: 33.2 g/dL (ref 30.0–36.0)
MCV: 94.1 fL (ref 80.0–100.0)
Monocytes Absolute: 0.4 10*3/uL (ref 0.1–1.0)
Monocytes Relative: 8 %
Neutro Abs: 3.6 10*3/uL (ref 1.7–7.7)
Neutrophils Relative %: 69 %
Platelet Count: 127 10*3/uL — ABNORMAL LOW (ref 150–400)
RBC: 3.58 MIL/uL — ABNORMAL LOW (ref 3.87–5.11)
RDW: 15 % (ref 11.5–15.5)
WBC Count: 5.2 10*3/uL (ref 4.0–10.5)
nRBC: 0 % (ref 0.0–0.2)

## 2020-05-03 NOTE — Progress Notes (Signed)
Hematology and Oncology Follow Up Visit  [name] [id number] 1934-09-19 85 y.o. 05/03/2020   Principle Diagnosis:  Immune based thrombocytopenia History of PE Iron deficiency anemia Atrial fib  Current Therapy: Nplate q3week for platelet count < 100K IV Iron as indicated Eliquis 2.5 mg po BID   Interim History:  [name] is here today for follow-up. She states that she feels wonderful. She denies fatigue.  Platelets are stable at 127, Hgb 11.2, WBC 5.2.  No bleeding, bruising or petechiae.  No fever, chills, n/v, cough, rash, dizziness, SOB, chest pain, palpitations, abdominal pain or changes in bowel or bladder habits.  No falls or syncope.  She is ambulating with a walker for support.  She has chronic swelling in her lower extremities that is stable to improved on exam. Pedal pulses are 2+.  She has numbness in the fingers of her left hand which effects her playing the piano.  She has maintained a good appetite and is staying well hydrated. Her weight is stable.   ECOG Performance Status: 1 - Symptomatic but completely ambulatory  Medications:  Allergies as of 05/03/2020      Reactions   Adhesive [tape] Other (See Comments)   PULLS OFF THE SKIN   Apixaban Other (See Comments)   Internal Bleeding   Aspirin Itching, Rash, Hives, Swelling   Swelling of her tongue   Mirabegron Other (See Comments)   Patient experienced A-Fib   Pineapple Anaphylaxis, Swelling   Throat swells and blisters on tongue and roof of mouth per patient   Metformin Diarrhea, Nausea Only   Tetracycline Hives   Fluticasone-salmeterol Itching, Rash   Iodinated Diagnostic Agents Itching, Rash      Lactose Intolerance (gi) Other (See Comments)   Gas   Latex Itching, Rash, Other (See Comments)   Pulls off the skin and causes welts   Lisinopril Cough   Metronidazole Other (See Comments)   Reaction not known   Sulfa Antibiotics Rash   Sulfonamide Derivatives Itching, Rash       Medication List       Accurate as of May 03, 2020 12:13 PM. If you have any questions, ask your nurse or doctor.        acetaminophen 325 MG tablet Commonly known as: TYLENOL Take 650 mg by mouth every 6 (six) hours as needed for moderate pain or fever (for fever or pain/discomfort).   atorvastatin 40 MG tablet Commonly known as: LIPITOR Take 40 mg by mouth at bedtime.   bisacodyl 5 MG EC tablet Commonly known as: DULCOLAX Take 10 mg by mouth every 3 (three) days as needed (for constipation).   feeding supplement (PRO-STAT SUGAR FREE 64) Liqd Take 30 mLs by mouth 2 (two) times daily.   fenofibrate 54 MG tablet TAKE 1 TABLET(54 MG) BY MOUTH DAILY   fexofenadine 180 MG tablet Commonly known as: ALLEGRA Take 180 mg by mouth daily.   FreeStyle Libre 14 Day Sensor Misc Apply new sensor every 14 days.   furosemide 20 MG tablet Commonly known as: LASIX Take 1 tablet (20 mg total) by mouth daily. Take 20 mg by mouth in the morning on Sun/Tues/Thurs/Sat 40mg  on Mon/Wed/Friday   hyoscyamine 0.125 MG tablet Commonly known as: LEVSIN Take 1 tablet by mouth in the morning and at bedtime.   insulin lispro 100 UNIT/ML KwikPen Commonly known as: HumaLOG KwikPen Inject 0.13-0.2 mLs (13-20 Units total) into the skin with breakfast, with lunch, and with evening meal.   Insulin Pen Needle  32G X 4 MM Misc Use 4x a day   INSULIN SYRINGE .3CC/29GX1/2" 29G X 1/2" 0.3 ML Misc Use to inject insulin 4 times a day.   Lantus SoloStar 100 UNIT/ML Solostar Pen Generic drug: insulin glargine Inject 50 Units into the skin daily. What changed: how much to take   levothyroxine 112 MCG tablet Commonly known as: SYNTHROID TAKE 1 TABLET BY MOUTH DAILY   multivitamin tablet Take 1 tablet by mouth daily.   potassium citrate 10 MEQ (1080 MG) SR tablet Commonly known as: UROCIT-K Take 2 tablets (20 mEq total) by mouth daily. What changed: when to take this   sertraline 50 MG  tablet Commonly known as: ZOLOFT Take 50 mg by mouth daily.   vitamin C 250 MG tablet Commonly known as: ASCORBIC ACID Take 250 mg by mouth daily.   Vitamin D3 50 MCG (2000 UT) Tabs Take 2,000 Units by mouth daily.       Allergies:  Allergies  Allergen Reactions  . Adhesive [Tape] Other (See Comments)    PULLS OFF THE SKIN  . Apixaban Other (See Comments)    Internal Bleeding  . Aspirin Itching, Rash, Hives and Swelling    Swelling of her tongue  . Mirabegron Other (See Comments)    Patient experienced A-Fib  . Pineapple Anaphylaxis and Swelling    Throat swells and blisters on tongue and roof of mouth per patient  . Metformin Diarrhea and Nausea Only  . Tetracycline Hives  . Fluticasone-Salmeterol Itching and Rash  . Iodinated Diagnostic Agents Itching and Rash       . Lactose Intolerance (Gi) Other (See Comments)    Gas   . Latex Itching, Rash and Other (See Comments)    Pulls off the skin and causes welts  . Lisinopril Cough  . Metronidazole Other (See Comments)    Reaction not known  . Sulfa Antibiotics Rash  . Sulfonamide Derivatives Itching and Rash    Past Medical History, Surgical history, Social history, and Family History were reviewed and updated.  Review of Systems: All other 10 point review of systems is negative.   Physical Exam:  vitals were not taken for this visit.   Wt Readings from Last 3 Encounters:  04/05/20 (!) 219 lb 12.8 oz (99.7 kg)  04/02/20 (!) 218 lb 8 oz (99.1 kg)  02/19/20 213 lb 12.8 oz (97 kg)    Ocular: Sclerae unicteric, pupils equal, round and reactive to light Ear-nose-throat: Oropharynx clear, dentition fair Lymphatic: No cervical or supraclavicular adenopathy Lungs no rales or rhonchi, good excursion bilaterally Heart regular rate and rhythm, no murmur appreciated Abd soft, nontender, positive bowel sounds, no liver or spleen tip palpated on exam, no fluid wave  MSK no focal spinal tenderness, no joint  edema Neuro: non-focal, well-oriented, appropriate affect Breasts: Deferred   Lab Results  Component Value Date   WBC 5.2 05/03/2020   HGB 11.2 (L) 05/03/2020   HCT 33.7 (L) 05/03/2020   MCV 94.1 05/03/2020   PLT 127 (L) 05/03/2020   Lab Results  Component Value Date   FERRITIN 375 (H) 04/02/2020   IRON 74 04/02/2020   TIBC 299 04/02/2020   UIBC 224 04/02/2020   IRONPCTSAT 25 04/02/2020   Lab Results  Component Value Date   RETICCTPCT 2.8 07/05/2019   RBC 3.58 (L) 05/03/2020   RETICCTABS 73.3 01/28/2011   No results found for: KPAFRELGTCHN, LAMBDASER, KAPLAMBRATIO No results found for: IGGSERUM, IGA, IGMSERUM No results found for: TOTALPROTELP, ALBUMINELP, A1GS,  Nelida Meuse, SPEI   Chemistry      Component Value Date/Time   NA 143 05/03/2020 1140   NA 145 09/08/2017 1317   NA 142 02/25/2017 1013   K 3.9 05/03/2020 1140   K 4.5 09/08/2017 1317   K 3.8 02/25/2017 1013   CL 101 05/03/2020 1140   CL 98 09/08/2017 1317   CO2 32 05/03/2020 1140   CO2 32 09/08/2017 1317   CO2 26 02/25/2017 1013   BUN 54 (H) 05/03/2020 1140   BUN 34 (H) 09/08/2017 1317   BUN 20.1 02/25/2017 1013   CREATININE 1.63 (H) 05/03/2020 1140   CREATININE 1.9 (H) 09/08/2017 1317   CREATININE 1.8 (H) 02/25/2017 1013      Component Value Date/Time   CALCIUM 9.9 05/03/2020 1140   CALCIUM 9.3 09/08/2017 1317   CALCIUM 8.7 02/25/2017 1013   ALKPHOS 68 05/03/2020 1140   ALKPHOS 45 09/08/2017 1317   ALKPHOS 65 02/25/2017 1013   AST 15 05/03/2020 1140   AST 16 02/25/2017 1013   ALT 16 05/03/2020 1140   ALT 26 09/08/2017 1317   ALT 13 02/25/2017 1013   BILITOT 0.8 05/03/2020 1140   BILITOT 0.76 02/25/2017 1013       Impression and Plan: [name] is a very pleasant 85yo caucasian female with immune based thrombocytopenia. No Nplate needed today.  We will see what her iron studies look like and replace if needed.  We will plan to see her again in another 6 weeks.   She can contact our office with any questions or concerns.   Laverna Peace, NP 8/27/202112:13 PM

## 2020-05-06 ENCOUNTER — Other Ambulatory Visit: Payer: Self-pay | Admitting: Family Medicine

## 2020-05-06 ENCOUNTER — Telehealth: Payer: Self-pay | Admitting: Family

## 2020-05-06 DIAGNOSIS — Z760 Encounter for issue of repeat prescription: Secondary | ICD-10-CM

## 2020-05-06 LAB — FERRITIN: Ferritin: 341 ng/mL — ABNORMAL HIGH (ref 11–307)

## 2020-05-06 LAB — IRON AND TIBC
Iron: 75 ug/dL (ref 41–142)
Saturation Ratios: 25 % (ref 21–57)
TIBC: 295 ug/dL (ref 236–444)
UIBC: 220 ug/dL (ref 120–384)

## 2020-05-06 NOTE — Telephone Encounter (Signed)
Appointments scheduled calendar printed & mailed per 8/27 los

## 2020-05-07 ENCOUNTER — Other Ambulatory Visit: Payer: Self-pay

## 2020-05-07 ENCOUNTER — Encounter: Payer: Self-pay | Admitting: Physical Therapy

## 2020-05-07 ENCOUNTER — Ambulatory Visit: Payer: Medicare Other | Admitting: Physical Therapy

## 2020-05-07 DIAGNOSIS — R262 Difficulty in walking, not elsewhere classified: Secondary | ICD-10-CM | POA: Diagnosis not present

## 2020-05-07 DIAGNOSIS — R2689 Other abnormalities of gait and mobility: Secondary | ICD-10-CM

## 2020-05-07 DIAGNOSIS — M6281 Muscle weakness (generalized): Secondary | ICD-10-CM

## 2020-05-07 DIAGNOSIS — G8929 Other chronic pain: Secondary | ICD-10-CM

## 2020-05-07 NOTE — Therapy (Signed)
[address], [place], [address] Phone: [phone]   Fax:  [phone]  Physical Therapy Treatment  Patient Details  Name: [name] MRN: [id number] Date of Birth: 04/29/1935 Referring Provider (PT): Mcarthur Rossetti, MD   Encounter Date: 05/07/2020   PT End of Session - 05/07/20 1057    Visit Number 16    Number of Visits 24    Date for PT Re-Evaluation 05/28/20    PT Start Time 7510    PT Stop Time 2585    PT Time Calculation (min) 45 min    Activity Tolerance Patient tolerated treatment well    Behavior During Therapy [facility] for tasks assessed/performed           Past Medical History:  Diagnosis Date   Allergic rhinitis    Anxiety state, unspecified    panic attacks   CHF (congestive heart failure) ([place])    Chronic respiratory failure with hypoxia ([place]) 07/03/2019   Depressive disorder, not elsewhere classified    Extrinsic asthma, unspecified    no problem since adulthood   HCAP (healthcare-associated pneumonia)    Obesity    OSA on CPAP    severe   Pneumonitis 07/14/2019   Pressure injury of skin 07/10/2019   Pure hypercholesterolemia    Respiratory failure ([place]) 08/24/2015   Respiratory failure with hypoxia ([place]) 09/2008   acute, secondary to multiple bilateral pulmonary embolism , negative hypercoagulable workup 09/2008 hospital stay   Scoliosis    Type II or unspecified type diabetes mellitus without mention of complication, not stated as uncontrolled    Unspecified essential hypertension    Unspecified hypothyroidism    hypo    Past Surgical History:  Procedure Laterality Date   CARDIOVERSION N/A 06/26/2019   Procedure: CARDIOVERSION;  Surgeon: Jerline Pain, MD;  Location: [facility];  Service: Cardiovascular;  Laterality: N/A;   EYE SURGERY Bilateral 2005   cataracts, implants   KIDNEY STONE SURGERY Bilateral    2016, 2017    knee  replaced Left 2013   THYROID SURGERY  1966   nodule removal; partial thyroidectomy; radioactive iodine x2; 1990's    There were no vitals filed for this visit.   Subjective Assessment - 05/07/20 1058    Subjective Pt states she is feeling better since calling out due to sickness. Pt states she can tell her legs are getting stronger.    Limitations Walking;Lifting;House hold activities    How long can you walk comfortably? Needs rest breaks; limited due to respiratory status    Patient Stated Goals Return back to PLOF; less reliant on rollator    Currently in Pain? No/denies                             [facility] Adult PT Treatment/Exercise - 05/07/20 0001      Knee/Hip Exercises: Aerobic   Nustep L7 x 8 min      Shoulder Exercises: Supine   Horizontal ABduction Strengthening;Both;Theraband   3x10   Theraband Level (Shoulder Horizontal ABduction) Level 2 (Red)    External Rotation Strengthening;Both    Theraband Level (Shoulder External Rotation) Level 2 (Red)    Flexion Strengthening;Both   using dowel; 3x10   Other Supine Exercises chest press 2# 3x10    Other Supine Exercises serratus punch 2# 3x10, tricep curl 1# 3x10  PT Short Term Goals - 04/09/20 1704      PT SHORT TERM GOAL #1   Title be independent in initial HEP    Baseline reviwed    Status Achieved      PT SHORT TERM GOAL #2   Title walk for 4-5 min without fatigue to assist with community ambulation with rollator    Baseline Pt with SOB noted after walking from lobby to gym    Time 3    Period Weeks    Status Achieved      PT SHORT TERM GOAL #3   Title -    Baseline -      PT SHORT TERM GOAL #4   Title -    Baseline -             PT Long Term Goals - 04/16/20 1525      PT LONG TERM GOAL #1   Title be independent in advanced HEP    Status Achieved      PT LONG TERM GOAL #2   Title Pt will improve 5 times sit<>stand to <13 sec for decreased fall risk     Baseline 16 sec    Time 6    Period Weeks    Status On-going    Target Date 05/28/20      PT LONG TERM GOAL #3   Title Pt will be able to amb with normal reciprocal gait pattern without a/d for ~50'    Status Achieved      PT LONG TERM GOAL #4   Title Pt will demonstrate at least 4+/5 bilat LE strength bilat    Status Achieved      PT LONG TERM GOAL #5   Title Pt will have improved FOTO score to 31% impairment    Time 6    Period Weeks    Status On-going    Target Date 05/28/20      PT LONG TERM GOAL #6   Title Pt will be able to amb x 8-10 min with rollator MI without rest break to assist with community ambulation    Baseline 5 minutes    Time 6    Period Weeks    Status On-going    Target Date 05/28/20                 Plan - 05/07/20 1120    Clinical Impression Statement Treatment focused on improving pt's upper body strength and endurance for improved mobility with pushing rollator. Pt with mild complaint of R medial epicondyle pain. Treatment focused on improving shoulder and chest strengthening.    Personal Factors and Comorbidities Age;Comorbidity 1;Comorbidity 2;Comorbidity 3+    Comorbidities osteoporosis, chronic R knee OA/pain, diabetes with neuropathy    Examination-Activity Limitations Caring for Others;Carry;Lift;Locomotion Level;Squat;Stairs;Stand    Examination-Participation Restrictions Cleaning;Community Activity    Stability/Clinical Decision Making Evolving/Moderate complexity    Rehab Potential Good    PT Frequency 2x / week    PT Duration 6 weeks    PT Treatment/Interventions ADLs/Self Care Home Management;Aquatic Therapy;Electrical Stimulation;Cryotherapy;Iontophoresis 4mg /ml Dexamethasone;Moist Heat;Ultrasound;Gait training;Stair training;Functional mobility training;Therapeutic activities;Therapeutic exercise;Balance training;Neuromuscular re-education;Patient/family education;Manual techniques;Dry needling;Energy  conservation;Taping;Vasopneumatic Device    PT Next Visit Plan Continue to progress endurance and bilat LE strengthening and balance. Continue strengthening UEs for pt to feel less tired pushing rollator with ambulation.    PT Home Exercise Plan Access Code: [email or web address]    Consulted and Agree with Plan of Care Patient  Patient will benefit from skilled therapeutic intervention in order to improve the following deficits and impairments:  Difficulty walking, Cardiopulmonary status limiting activity, Decreased endurance, Decreased activity tolerance, Pain, Decreased balance, Decreased mobility, Decreased strength, Increased edema  Visit Diagnosis: Difficulty in walking, not elsewhere classified  Muscle weakness (generalized)  Chronic pain of right knee  Other abnormalities of gait and mobility     Problem List Patient Active Problem List   Diagnosis Date Noted   Atypical atrial flutter (HCC)    Normocytic anemia 05/18/2017   Chronic ITP (idiopathic thrombocytopenia) (HCC) 01/25/2017   Chronic kidney disease (CKD) stage G4/A1, severely decreased glomerular filtration rate (GFR) between 15-29 mL/min/1.73 square meter and albuminuria creatinine ratio less than 30 mg/g ([place]) 09/04/2015   GOUT 06/23/2010   DERMATITIS 01/16/2010   BACK PAIN 06/13/2009   MYALGIA 06/13/2009   Anxiety state 12/13/2008   LEG EDEMA, CHRONIC 11/08/2008   Sleep apnea 10/26/2008   Hypothyroidism 09/27/2008   HYPERCHOLESTEROLEMIA 09/27/2008   OBESITY 09/27/2008   DEPRESSION 09/27/2008   Essential hypertension 09/27/2008   ALLERGIC RHINITIS 09/27/2008   ASTHMA, CHILDHOOD 09/27/2008    Emmett Arntz April Ma L Esvin Hnat PT, DPT 05/07/2020, 12:00 PM  [facility] [address] [place], [place], [address] Phone: [phone]   Fax:  [phone]  Name: [name] MRN: [id number] Date of Birth: [birth year]

## 2020-05-09 ENCOUNTER — Other Ambulatory Visit: Payer: Self-pay

## 2020-05-09 ENCOUNTER — Ambulatory Visit: Payer: Medicare Other | Attending: Orthopaedic Surgery | Admitting: Physical Therapy

## 2020-05-09 DIAGNOSIS — M6281 Muscle weakness (generalized): Secondary | ICD-10-CM | POA: Insufficient documentation

## 2020-05-09 DIAGNOSIS — G8929 Other chronic pain: Secondary | ICD-10-CM | POA: Diagnosis present

## 2020-05-09 DIAGNOSIS — M25561 Pain in right knee: Secondary | ICD-10-CM | POA: Insufficient documentation

## 2020-05-09 DIAGNOSIS — R262 Difficulty in walking, not elsewhere classified: Secondary | ICD-10-CM | POA: Insufficient documentation

## 2020-05-09 DIAGNOSIS — R2689 Other abnormalities of gait and mobility: Secondary | ICD-10-CM | POA: Insufficient documentation

## 2020-05-09 NOTE — Therapy (Signed)
[place], [place], [address] Phone: [phone]   Fax:  [phone]  Physical Therapy Treatment  Patient Details  Name: [name] MRN: [id number] Date of Birth: 01-Aug-1935 Referring Provider (PT): Mcarthur Rossetti, MD   Encounter Date: 05/09/2020   PT End of Session - 05/09/20 1543    Visit Number 17    Number of Visits 24    Date for PT Re-Evaluation 05/28/20    PT Start Time 2993    PT Stop Time 1619    PT Time Calculation (min) 41 min    Activity Tolerance Patient tolerated treatment well    Behavior During Therapy [facility] for tasks assessed/performed           Past Medical History:  Diagnosis Date  . Allergic rhinitis   . Anxiety state, unspecified    panic attacks  . CHF (congestive heart failure) ([place])   . Chronic respiratory failure with hypoxia ([place]) 07/03/2019  . Depressive disorder, not elsewhere classified   . Extrinsic asthma, unspecified    no problem since adulthood  . HCAP (healthcare-associated pneumonia)   . Obesity   . OSA on CPAP    severe  . Pneumonitis 07/14/2019  . Pressure injury of skin 07/10/2019  . Pure hypercholesterolemia   . Respiratory failure ([place]) 08/24/2015  . Respiratory failure with hypoxia ([place]) 09/2008   acute, secondary to multiple bilateral pulmonary embolism , negative hypercoagulable workup 09/2008 hospital stay  . Scoliosis   . Type II or unspecified type diabetes mellitus without mention of complication, not stated as uncontrolled   . Unspecified essential hypertension   . Unspecified hypothyroidism    hypo    Past Surgical History:  Procedure Laterality Date  . CARDIOVERSION N/A 06/26/2019   Procedure: CARDIOVERSION;  Surgeon: Jerline Pain, MD;  Location: [facility] END[facility]COPY;  Service: Cardiovascular;  Laterality: N/A;  . EYE SURGERY Bilateral 2005   cataracts, implants  . KIDNEY STONE SURGERY Bilateral    2016, 2017   . knee  replaced Left 2013  . THYROID SURGERY  1966   nodule removal; partial thyroidectomy; radioactive iodine x2; 1990's    There were no vitals filed for this visit.   Subjective Assessment - 05/09/20 1541    Subjective Pt reports feeling upper body soreness after last session. She states she was able to manage it with some exercises, stretching, and tennis ball.    Limitations Walking;Lifting;House hold activities    How long can you walk comfortably? Needs rest breaks; limited due to respiratory status    Patient Stated Goals Return back to PLOF; less reliant on rollator    Currently in Pain? No/denies                             [facility] Adult PT Treatment/Exercise - 05/09/20 0001      Knee/Hip Exercises: Aerobic   Nustep L7 x 4 min    Other Aerobic 6 min walking with rollator, 2 standing rest breaks. 336'      Knee/Hip Exercises: Machines for Strengthening   Total Gym Leg Press 55#x10, 65# 2x5      Knee/Hip Exercises: Standing   Gait Training --      Knee/Hip Exercises: Seated   Other Seated Knee/Hip Exercises on dynadisk: marching x 10, LAQ x 10      Knee/Hip Exercises: Supine   Straight Leg Raises Strengthening;Both;20 reps    Other  Supine Knee/Hip Exercises PPT with marching x 10                    PT Short Term Goals - 04/09/20 1704      PT SHORT TERM GOAL #1   Title be independent in initial HEP    Baseline reviwed    Status Achieved      PT SHORT TERM GOAL #2   Title walk for 4-5 min without fatigue to assist with community ambulation with rollator    Baseline Pt with SOB noted after walking from lobby to gym    Time 3    Period Weeks    Status Achieved      PT SHORT TERM GOAL #3   Title -    Baseline -      PT SHORT TERM GOAL #4   Title -    Baseline -             PT Long Term Goals - 04/16/20 1525      PT LONG TERM GOAL #1   Title be independent in advanced HEP    Status Achieved      PT LONG TERM GOAL #2   Title  Pt will improve 5 times sit<>stand to <13 sec for decreased fall risk    Baseline 16 sec    Time 6    Period Weeks    Status On-going    Target Date 05/28/20      PT LONG TERM GOAL #3   Title Pt will be able to amb with normal reciprocal gait pattern without a/d for ~50'    Status Achieved      PT LONG TERM GOAL #4   Title Pt will demonstrate at least 4+/5 bilat LE strength bilat    Status Achieved      PT LONG TERM GOAL #5   Title Pt will have improved FOTO score to 31% impairment    Time 6    Period Weeks    Status On-going    Target Date 05/28/20      PT LONG TERM GOAL #6   Title Pt will be able to amb x 8-10 min with rollator MI without rest break to assist with community ambulation    Baseline 5 minutes    Time 6    Period Weeks    Status On-going    Target Date 05/28/20                 Plan - 05/09/20 1621    Clinical Impression Statement Pt with improving endurance -- pt now able to tolerate ambulating 6 minutes and only requiring standing rest breaks vs sitting rest breaks. Treatment focused on increasing pt's bilat LE strength and endurance; initiated core strengthening.    Personal Factors and Comorbidities Age;Comorbidity 1;Comorbidity 2;Comorbidity 3+    Comorbidities osteoporosis, chronic R knee OA/pain, diabetes with neuropathy    Examination-Activity Limitations Caring for Others;Carry;Lift;Locomotion Level;Squat;Stairs;Stand    Examination-Participation Restrictions Cleaning;Community Activity    Stability/Clinical Decision Making Evolving/Moderate complexity    Rehab Potential Good    PT Frequency 2x / week    PT Duration 6 weeks    PT Treatment/Interventions ADLs/Self Care Home Management;Aquatic Therapy;Electrical Stimulation;Cryotherapy;Iontophoresis 4mg /ml Dexamethasone;Moist Heat;Ultrasound;Gait training;Stair training;Functional mobility training;Therapeutic activities;Therapeutic exercise;Balance training;Neuromuscular  re-education;Patient/family education;Manual techniques;Dry needling;Energy conservation;Taping;Vasopneumatic Device    PT Next Visit Plan Continue to progress endurance and bilat LE strengthening and balance. Continue strengthening UEs for pt to feel less tired pushing  rollator with ambulation.    PT Home Exercise Plan Access Code: [email or web address]. Provided pt with blue tband to work on hip strengthening at home    Consulted and Agree with Plan of Care Patient           Patient will benefit from skilled therapeutic intervention in order to improve the following deficits and impairments:  Difficulty walking, Cardiopulmonary status limiting activity, Decreased endurance, Decreased activity tolerance, Pain, Decreased balance, Decreased mobility, Decreased strength, Increased edema  Visit Diagnosis: Difficulty in walking, not elsewhere classified  Muscle weakness (generalized)  Chronic pain of right knee  Other abnormalities of gait and mobility     Problem List Patient Active Problem List   Diagnosis Date Noted  . Atypical atrial flutter ([place])   . Normocytic anemia 05/18/2017  . Chronic ITP (idiopathic thrombocytopenia) (HCC) 01/25/2017  . Chronic kidney disease (CKD) stage G4/A1, severely decreased glomerular filtration rate (GFR) between 15-29 mL/min/1.73 square meter and albuminuria creatinine ratio less than 30 mg/g (HCC) 09/04/2015  . GOUT 06/23/2010  . DERMATITIS 01/16/2010  . BACK PAIN 06/13/2009  . MYALGIA 06/13/2009  . Anxiety state 12/13/2008  . LEG EDEMA, CHRONIC 11/08/2008  . Sleep apnea 10/26/2008  . Hypothyroidism 09/27/2008  . HYPERCHOLESTEROLEMIA 09/27/2008  . OBESITY 09/27/2008  . DEPRESSION 09/27/2008  . Essential hypertension 09/27/2008  . ALLERGIC RHINITIS 09/27/2008  . ASTHMA, CHILDHOOD 09/27/2008    [facility] [address] PT, DPT 05/09/2020, 4:24 PM  [facility] [address] [place], [place],  [address] Phone: [phone]   Fax:  [phone]  Name: Lamari Youngers MRN: [id number] Date of Birth: 09-11-1934

## 2020-05-14 ENCOUNTER — Telehealth: Payer: Self-pay | Admitting: Family Medicine

## 2020-05-14 ENCOUNTER — Other Ambulatory Visit: Payer: Self-pay | Admitting: Family Medicine

## 2020-05-14 NOTE — Telephone Encounter (Signed)
Pt daughter called to say pt has not had her medication   sertraline (ZOLOFT) 50 MG tablet   Last night and needs the medication called in to   [facility], [address] AT [address] [place], [place] [address]   Also pt  daughter  Mauri [place] said Dr. Ethlyn Gallery would take her as a new pt. Please confirm so appointment can be made

## 2020-05-14 NOTE — Telephone Encounter (Signed)
Zoloft has been sent and ok to accepting daughter.

## 2020-05-15 ENCOUNTER — Ambulatory Visit: Payer: Medicare Other | Admitting: Physical Therapy

## 2020-05-15 ENCOUNTER — Other Ambulatory Visit: Payer: Self-pay

## 2020-05-15 DIAGNOSIS — M6281 Muscle weakness (generalized): Secondary | ICD-10-CM

## 2020-05-15 DIAGNOSIS — R262 Difficulty in walking, not elsewhere classified: Secondary | ICD-10-CM | POA: Diagnosis not present

## 2020-05-15 DIAGNOSIS — M25561 Pain in right knee: Secondary | ICD-10-CM

## 2020-05-15 DIAGNOSIS — R2689 Other abnormalities of gait and mobility: Secondary | ICD-10-CM

## 2020-05-15 DIAGNOSIS — G8929 Other chronic pain: Secondary | ICD-10-CM

## 2020-05-15 NOTE — Therapy (Signed)
[place], [place], [address] Phone: [phone]   Fax:  [phone]  Physical Therapy Treatment  Patient Details  Name: [name] MRN: [id number] Date of Birth: 12-Jun-1935 Referring Provider (PT): Mcarthur Rossetti, MD   Encounter Date: 05/15/2020   PT End of Session - 05/15/20 1633    Visit Number 18    Number of Visits 24    Date for PT Re-Evaluation 05/28/20    PT Start Time 1625    PT Stop Time 1705    PT Time Calculation (min) 40 min    Activity Tolerance Patient tolerated treatment well    Behavior During Therapy [facility] for tasks assessed/performed           Past Medical History:  Diagnosis Date  . Allergic rhinitis   . Anxiety state, unspecified    panic attacks  . CHF (congestive heart failure) ([place])   . Chronic respiratory failure with hypoxia ([place]) 07/03/2019  . Depressive disorder, not elsewhere classified   . Extrinsic asthma, unspecified    no problem since adulthood  . HCAP (healthcare-associated pneumonia)   . Obesity   . OSA on CPAP    severe  . Pneumonitis 07/14/2019  . Pressure injury of skin 07/10/2019  . Pure hypercholesterolemia   . Respiratory failure ([place]) 08/24/2015  . Respiratory failure with hypoxia ([place]) 09/2008   acute, secondary to multiple bilateral pulmonary embolism , negative hypercoagulable workup 09/2008 hospital stay  . Scoliosis   . Type II or unspecified type diabetes mellitus without mention of complication, not stated as uncontrolled   . Unspecified essential hypertension   . Unspecified hypothyroidism    hypo    Past Surgical History:  Procedure Laterality Date  . CARDIOVERSION N/A 06/26/2019   Procedure: CARDIOVERSION;  Surgeon: Jerline Pain, MD;  [facility]: [facility] ENDOSCOPY;  Service: Cardiovascular;  Laterality: N/A;  . EYE SURGERY Bilateral 2005   cataracts, implants  . KIDNEY STONE SURGERY Bilateral    2016, 2017   . knee  replaced Left 2013  . THYROID SURGERY  1966   nodule removal; partial thyroidectomy; radioactive iodine x2; 1990's    There were no vitals filed for this visit.   Subjective Assessment - 05/15/20 1630    Subjective Pt states she's been working on her hip strengthening. Pt notes she feels she gets thrown off balance to try and keep her hips straight.    Limitations Walking;Lifting;House hold activities    How long can you walk comfortably? Needs rest breaks; limited due to respiratory status    Patient Stated Goals Return back to PLOF; less reliant on rollator    Currently in Pain? No/denies                             [facility] Adult PT Treatment/Exercise - 05/15/20 0001      Knee/Hip Exercises: Aerobic   Nustep L7 x 6 min    Other Aerobic 8 min walking with rollator, 2 standing rest breaks. 495'.       Knee/Hip Exercises: Standing   Gait Training Next to counter 4x10' no a/d    Other Standing Knee Exercises Forward/backwards walking 5x2'                  PT Education - 05/15/20 1718    Education Details Discussed transitioning to community wellness program once therapy is done.    Person(s) Educated Patient  Methods Explanation;Demonstration;Tactile cues;Verbal cues    Comprehension Verbalized understanding;Returned demonstration;Verbal cues required;Tactile cues required            PT Short Term Goals - 04/09/20 1704      PT SHORT TERM GOAL #1   Title be independent in initial HEP    Baseline reviwed    Status Achieved      PT SHORT TERM GOAL #2   Title walk for 4-5 min without fatigue to assist with community ambulation with rollator    Baseline Pt with SOB noted after walking from lobby to gym    Time 3    Period Weeks    Status Achieved      PT SHORT TERM GOAL #3   Title -    Baseline -      PT SHORT TERM GOAL #4   Title -    Baseline -             PT Long Term Goals - 04/16/20 1525      PT LONG TERM GOAL #1   Title be  independent in advanced HEP    Status Achieved      PT LONG TERM GOAL #2   Title Pt will improve 5 times sit<>stand to <13 sec for decreased fall risk    Baseline 16 sec    Time 6    Period Weeks    Status On-going    Target Date 05/28/20      PT LONG TERM GOAL #3   Title Pt will be able to amb with normal reciprocal gait pattern without a/d for ~50'    Status Achieved      PT LONG TERM GOAL #4   Title Pt will demonstrate at least 4+/5 bilat LE strength bilat    Status Achieved      PT LONG TERM GOAL #5   Title Pt will have improved FOTO score to 31% impairment    Time 6    Period Weeks    Status On-going    Target Date 05/28/20      PT LONG TERM GOAL #6   Title Pt will be able to amb x 8-10 min with rollator MI without rest break to assist with community ambulation    Baseline 5 minutes    Time 6    Period Weeks    Status On-going    Target Date 05/28/20                 Plan - 05/15/20 1715    Clinical Impression Statement Treatment continues to focus on improving pt endurance with and without rollator. Pt able to tolerate ambulating 8 minutes for ~500'. Pt requires at least 2 rest breaks. Time spent discussing transitioning pt to community wellness program and opening of [employer] for [employer].    Personal Factors and Comorbidities Age;Comorbidity 1;Comorbidity 2;Comorbidity 3+    Comorbidities osteoporosis, chronic R knee OA/pain, diabetes with neuropathy    Examination-Activity Limitations Caring for Others;Carry;Lift;Locomotion Level;Squat;Stairs;Stand    Examination-Participation Restrictions Cleaning;Community Activity    Stability/Clinical Decision Making Evolving/Moderate complexity    Rehab Potential Good    PT Frequency 2x / week    PT Duration 6 weeks    PT Treatment/Interventions ADLs/Self Care Home Management;Aquatic Therapy;Electrical Stimulation;Cryotherapy;Iontophoresis 4mg /ml Dexamethasone;Moist Heat;Ultrasound;Gait training;Stair  training;Functional mobility training;Therapeutic activities;Therapeutic exercise;Balance training;Neuromuscular re-education;Patient/family education;Manual techniques;Dry needling;Energy conservation;Taping;Vasopneumatic Device    PT Next Visit Plan Continue to progress endurance and bilat LE strengthening and balance. Continue strengthening UEs for  pt to feel less tired pushing rollator with ambulation.    PT Home Exercise Plan Access Code: [email or web address]. forwards/backwards walking from counter    Consulted and Agree with Plan of Care Patient           Patient will benefit from skilled therapeutic intervention in order to improve the following deficits and impairments:  Difficulty walking, Cardiopulmonary status limiting activity, Decreased endurance, Decreased activity tolerance, Pain, Decreased balance, Decreased mobility, Decreased strength, Increased edema  Visit Diagnosis: Difficulty in walking, not elsewhere classified  Muscle weakness (generalized)  Chronic pain of right knee  Other abnormalities of gait and mobility     Problem List Patient Active Problem List   Diagnosis Date Noted  . Atypical atrial flutter ([place])   . Normocytic anemia 05/18/2017  . Chronic ITP (idiopathic thrombocytopenia) (HCC) 01/25/2017  . Chronic kidney disease (CKD) stage G4/A1, severely decreased glomerular filtration rate (GFR) between 15-29 mL/min/1.73 square meter and albuminuria creatinine ratio less than 30 mg/g (HCC) 09/04/2015  . GOUT 06/23/2010  . DERMATITIS 01/16/2010  . BACK PAIN 06/13/2009  . MYALGIA 06/13/2009  . Anxiety state 12/13/2008  . LEG EDEMA, CHRONIC 11/08/2008  . Sleep apnea 10/26/2008  . Hypothyroidism 09/27/2008  . HYPERCHOLESTEROLEMIA 09/27/2008  . OBESITY 09/27/2008  . DEPRESSION 09/27/2008  . Essential hypertension 09/27/2008  . ALLERGIC RHINITIS 09/27/2008  . ASTHMA, CHILDHOOD 09/27/2008    [facility] [address] PT, DPT 05/15/2020, 5:20 PM  [facility] [address] [place], [place], [address] Phone: [phone]   Fax:  [phone]  Name: [name] MRN: [id number] Date of Birth: 27-Jul-1935

## 2020-05-15 NOTE — Telephone Encounter (Signed)
I will call patient to set up appointment. Thank you

## 2020-05-16 ENCOUNTER — Ambulatory Visit: Payer: Medicare Other | Admitting: Adult Health

## 2020-05-17 ENCOUNTER — Ambulatory Visit (INDEPENDENT_AMBULATORY_CARE_PROVIDER_SITE_OTHER): Payer: Medicare Other | Admitting: Internal Medicine

## 2020-05-17 ENCOUNTER — Other Ambulatory Visit: Payer: Self-pay

## 2020-05-17 ENCOUNTER — Encounter: Payer: Self-pay | Admitting: Internal Medicine

## 2020-05-17 VITALS — BP 122/70 | HR 64 | Ht 61.0 in | Wt 217.0 lb

## 2020-05-17 DIAGNOSIS — E890 Postprocedural hypothyroidism: Secondary | ICD-10-CM

## 2020-05-17 DIAGNOSIS — E1122 Type 2 diabetes mellitus with diabetic chronic kidney disease: Secondary | ICD-10-CM | POA: Diagnosis not present

## 2020-05-17 DIAGNOSIS — Z794 Long term (current) use of insulin: Secondary | ICD-10-CM

## 2020-05-17 DIAGNOSIS — Z6841 Body Mass Index (BMI) 40.0 and over, adult: Secondary | ICD-10-CM

## 2020-05-17 DIAGNOSIS — N184 Chronic kidney disease, stage 4 (severe): Secondary | ICD-10-CM | POA: Diagnosis not present

## 2020-05-17 LAB — POCT GLYCOSYLATED HEMOGLOBIN (HGB A1C): Hemoglobin A1C: 6.3 % — AB (ref 4.0–5.6)

## 2020-05-17 NOTE — Addendum Note (Signed)
Addended by: Cardell Peach I on: 05/17/2020 03:06 PM   Modules accepted: Orders

## 2020-05-17 NOTE — Patient Instructions (Addendum)
Please continue:   - Lantus 46 units   - Humalog 13-17 units 3x a day before meals  Please continue levothyroxine 112 mcg daily  Take the thyroid hormone every day, with water, at least 30 minutes before breakfast, separated by at least 4 hours from: - acid reflux medications - calcium - iron - multivitamins  Please stop at the lab.  Please return in 3-4 months with your freestyle libre CGM

## 2020-05-17 NOTE — Progress Notes (Signed)
Patient ID: [name], female   DOB: 1935/02/27, 85 y.o.   MRN: [id number]   This visit occurred during the SARS-CoV-2 public health emergency.  Safety protocols were in place, including screening questions prior to the visit, additional usage of staff PPE, and extensive cleaning of exam room while observing appropriate contact time as indicated for disinfecting solutions.   HPI: [name] is a 85 y.o.-year-old female, returning for f/u for DM2, dx in late 1990s, insulin-dependent since 2012, uncontrolled, with complications (CKD stage 4, CHF, + DR) and hypothyroidism. She prev. Saw Dr. Chalmers Cater.  Last visit with me 4 months ago.  She is accompanied by her daughter who offers part of the history, including about insulin doses, diet, and blood sugars.  She is on palliative care.   DM2:  Reviewed HbA1c levels: Lab Results  Component Value Date   HGBA1C 7.1 (A) 01/12/2020   HGBA1C 7.3 (H) 07/19/2019   HGBA1C 6.3 (A) 02/22/2019  12/11/2018: HbA1c 6.9% 08/19/2017: HbA1c calculated from fructosamine is 6.7%  She was on (regimen changed 12/29/2019):   - Lantus 44 units   - Humalog 15 units 3x a day before meals  On 01/12/2020 we changed to:  - Lantus 46 units   - Humalog 13-17 units 3x a day before meals  She checks her sugars more than 4 times a day with her freestyle libre CGM.  Freestyle libre CGM parameters: - Average: 152 >> 164 - % active CGM time: 91% >> 97% of the time - Glucose variability 23.3  >> 17.1% (target < or = to 36%) - GMI: 7.2% - time in range:  - very low (<54): 0% >> 0% - low (54-69): 3% >> 0% - normal range (70-180): 76% >> 74% - high sugars (181-250): 21% >> 25% - very high sugars (>250): 0% >> 1%     Prev.:   Prev.:  Lowest sugar was 295 >> .Marland Kitchen.50s >> 54 (Libre) >> 80; she has hypoglycemia awareness at 100. Highest sugar was 559 >> .Marland KitchenMarland Kitchen 400 (steroids) >> 224 >> 200s.  Glucometer: True Metrix air >> freestyle libre CGM  Pt's  meals are: - Breakfast: grits, bacon, green tea + stevia >> eggs + bacon + toast/grits + fruit - Lunch: 1/2 sandwich + salad + chips and drink >> 1/2 sandwich - snack: fruit - Dinner:meat + veggies + occas. Starch >> meat + veggies - Snacks: 2   -+ Stage IV CKD, last BUN/creatinine:  Lab Results  Component Value Date   BUN 54 (H) 05/03/2020   BUN 59 (H) 04/02/2020   CREATININE 1.63 (H) 05/03/2020   CREATININE 1.91 (H) 04/02/2020  03/15/2018: 47/2.34, GFR 19, Glu 283  Reviewed her GFR levels: Lab Results  Component Value Date   GFRNONAA 29 (L) 05/03/2020   GFRNONAA 24 (L) 04/02/2020   GFRNONAA 29 (L) 02/19/2020   GFRNONAA 29 (L) 01/25/2020   GFRNONAA 30 (L) 01/04/2020   GFRNONAA 27 (L) 12/15/2019   GFRNONAA 30 (L) 11/22/2019   GFRNONAA 35 (L) 11/02/2019   GFRNONAA 36 (L) 10/20/2019   GFRNONAA 39 (L) 10/02/2019  He has a h/o uric acid kidney stones.  -+ HL; last set of lipids: Lab Results  Component Value Date   CHOL 131 05/19/2018   HDL 26.70 (L) 05/19/2018   LDLCALC 65 08/16/2009   LDLDIRECT 41.0 05/19/2018   TRIG (H) 05/19/2018    656.0 Triglyceride is over 400; calculations on Lipids are invalid.   CHOLHDL 5 05/19/2018  On atorvastatin  40, fenofibrate 45, . - last eye exam was in 02/2020: No DR, previously + DR -+ Numbness and tingling in her toes  She has a h/o urinary incontinence >> started Myrbetriq >> developed A fib >> started Eliquis >> had bleeding >> received blood transfusion.  Postsurgical (1960s) and postablative (1977 and 1999) hypothyroidism   Pt is on levothyroxine 112 mcg daily, taken: - in am - fasting - at least 30 min from b'fast - no Ca, Fe, MVI, PPIs (omeprazole) moved later in the day, then stopped - not on Biotin  Her TFTs have been normal: Lab Results  Component Value Date   TSH 0.355 07/20/2019   TSH 0.838 07/04/2019   TSH 1.64 02/22/2019   TSH 0.92 05/19/2018   TSH 1.82 12/15/2016   TSH 0.87 09/11/2015   TSH 0.715 08/25/2015    TSH 2.19 06/11/2015   TSH 1.46 09/25/2011   TSH 1.82 06/10/2011   She had a mild MI at the beginning of 2019 and she also has CHF.  She also has immune thrombocytopenia and anemia.  ROS: Constitutional: no weight gain/no weight loss, no fatigue, no subjective hyperthermia, no subjective hypothermia Eyes: no blurry vision, no xerophthalmia ENT: no sore throat, no nodules palpated in neck, no dysphagia, no odynophagia, no hoarseness Cardiovascular: no CP/no SOB/no palpitations/+ leg swelling Respiratory: no cough/no SOB/no wheezing Gastrointestinal: no N/no V/no D/no C/no acid reflux Musculoskeletal: no muscle aches/no joint aches Skin: no rashes, no hair loss Neurological: no tremors/+ numbness/+ tingling/no dizziness  I reviewed pt's medications, allergies, PMH, social hx, family hx, and changes were documented in the history of present illness. Otherwise, unchanged from my initial visit note.  Past Medical History:  Diagnosis Date  . Allergic rhinitis   . Anxiety state, unspecified    panic attacks  . CHF (congestive heart failure) ([place])   . Chronic respiratory failure with hypoxia ([place]) 07/03/2019  . Depressive disorder, not elsewhere classified   . Extrinsic asthma, unspecified    no problem since adulthood  . HCAP (healthcare-associated pneumonia)   . Obesity   . OSA on CPAP    severe  . Pneumonitis 07/14/2019  . Pressure injury of skin 07/10/2019  . Pure hypercholesterolemia   . Respiratory failure ([place]) 08/24/2015  . Respiratory failure with hypoxia ([place]) 09/2008   acute, secondary to multiple bilateral pulmonary embolism , negative hypercoagulable workup 09/2008 hospital stay  . Scoliosis   . Type II or unspecified type diabetes mellitus without mention of complication, not stated as uncontrolled   . Unspecified essential hypertension   . Unspecified hypothyroidism    hypo   Past Surgical History:  Procedure Laterality Date  . CARDIOVERSION N/A 06/26/2019    Procedure: CARDIOVERSION;  Surgeon: Jerline Pain, MD;  Location: [facility] ENDOSCOPY;  Service: Cardiovascular;  Laterality: N/A;  . EYE SURGERY Bilateral 2005   cataracts, implants  . KIDNEY STONE SURGERY Bilateral    2016, 2017   . knee replaced Left 2013  . THYROID SURGERY  1966   nodule removal; partial thyroidectomy; radioactive iodine x2; 1990's   Social History   Social History  . Marital status: Widowed    Spouse name: N/A  . Number of children: 2   Occupational History  . 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 Smoking status: Former Smoker    Years: 20.00    Quit date: 09/07/1989  . Smokeless tobacco: Never Used  . Alcohol  use No  . Drug use: No   Current Outpatient Medications on File Prior to Visit  Medication Sig Dispense Refill  . acetaminophen (TYLENOL) 325 MG tablet Take 650 mg by mouth every 6 (six) hours as needed for moderate pain or fever (for fever or pain/discomfort).     . Amino Acids-Protein Hydrolys (FEEDING SUPPLEMENT, PRO-STAT SUGAR FREE 64,) LIQD Take 30 mLs by mouth 2 (two) times daily. 887 mL 0  . atorvastatin (LIPITOR) 40 MG tablet Take 40 mg by mouth at bedtime.     . bisacodyl (DULCOLAX) 5 MG EC tablet Take 10 mg by mouth every 3 (three) days as needed (for constipation).     . Cholecalciferol (VITAMIN D3) 2000 units TABS Take 2,000 Units by mouth daily.    . Continuous Blood Gluc Sensor (FREESTYLE LIBRE 14 DAY SENSOR) MISC Apply new sensor every 14 days. 6 each 3  . fenofibrate 54 MG tablet TAKE 1 TABLET(54 MG) BY MOUTH DAILY 90 tablet 1  . fexofenadine (ALLEGRA) 180 MG tablet Take 180 mg by mouth daily.    . furosemide (LASIX) 20 MG tablet Take 1 tablet (20 mg total) by mouth daily. Take 20 mg by mouth in the morning on Sun/Tues/Thurs/Sat 40mg  on Mon/Wed/Friday 140 tablet 2  . hyoscyamine (LEVSIN) 0.125 MG tablet Take 1 tablet by mouth in the morning and at bedtime.     . insulin glargine (LANTUS  SOLOSTAR) 100 UNIT/ML Solostar Pen Inject 50 Units into the skin daily. (Patient taking differently: Inject 46 Units into the skin daily. ) 10 pen 3  . insulin lispro (HUMALOG KWIKPEN) 100 UNIT/ML KwikPen Inject 0.13-0.2 mLs (13-20 Units total) into the skin with breakfast, with lunch, and with evening meal. 10 pen 3  . Insulin Pen Needle 32G X 4 MM MISC Use 4x a day 200 each 3  . Insulin Syringe-Needle U-100 (INSULIN SYRINGE .3CC/29GX1/2") 29G X 1/2" 0.3 ML MISC Use to inject insulin 4 times a day. 400 each 3  . levothyroxine (SYNTHROID) 112 MCG tablet TAKE 1 TABLET BY MOUTH EVERY DAY 90 tablet 1  . Multiple Vitamin (MULTIVITAMIN) tablet Take 1 tablet by mouth daily.      . potassium citrate (UROCIT-K) 10 MEQ (1080 MG) SR tablet Take 20 mEq by mouth in the morning and at bedtime.     . sertraline (ZOLOFT) 50 MG tablet TAKE 1 TABLET BY MOUTH AT BEDTIME 90 tablet 1  . vitamin C (ASCORBIC ACID) 250 MG tablet Take 250 mg by mouth daily.     No current facility-administered medications on file prior to visit.   Allergies  Allergen Reactions  . Adhesive [Tape] Other (See Comments)    PULLS OFF THE SKIN  . Apixaban Other (See Comments)    Internal Bleeding  . Aspirin Itching, Rash, Hives and Swelling    Swelling of her tongue  . Mirabegron Other (See Comments)    Patient experienced A-Fib  . Pineapple Anaphylaxis and Swelling    Throat swells and blisters on tongue and roof of mouth per patient  . Metformin Diarrhea and Nausea Only  . Tetracycline Hives  . Fluticasone-Salmeterol Itching and Rash  . Iodinated Diagnostic Agents Itching and Rash       . Lactose Intolerance (Gi) Other (See Comments)    Gas   . Latex Itching, Rash and Other (See Comments)    Pulls off the skin and causes welts  . Lisinopril Cough  . Metronidazole Other (See Comments)    Reaction not  known  . Sulfa Antibiotics Rash  . Sulfonamide Derivatives Itching and Rash   Family History  Problem Relation Age of  Onset  . Hypertension Father   . Hyperlipidemia Father   . Allergies Mother   . Clotting disorder Mother   . Osteoarthritis Mother   . Asthma Mother   . Hypertension Mother   . Stroke Mother   . Coronary artery disease Other   . Stroke Other   . Rheum arthritis Maternal Grandmother   . Clotting disorder Maternal Grandmother   . Clotting disorder Maternal Uncle   . Arthritis Daughter   . Diabetes Daughter   . High blood pressure Daughter   . High Cholesterol Daughter   . Depression Maternal Grandfather   . Diabetes Paternal Grandmother   . Breast cancer Neg Hx    PE: BP 122/70   Ht 5\' 1"  (1.549 m)   Wt 217 lb (98.4 kg)   LMP  (LMP Unknown)   SpO2 96%   BMI 41.00 kg/m  Wt Readings from Last 3 Encounters:  05/17/20 217 lb (98.4 kg)  05/03/20 219 lb 1.9 oz (99.4 kg)  04/05/20 (!) 219 lb 12.8 oz (99.7 kg)   Constitutional: overweight, in NAD Eyes: PERRLA, EOMI, no exophthalmos ENT: moist mucous membranes, no thyromegaly, no cervical lymphadenopathy Cardiovascular: irreg. Irreg. Rhythm, RR, No RG, + LE swelling (pitting) and erythema Respiratory: CTA B Gastrointestinal: abdomen soft, NT, ND, BS+ Musculoskeletal: no deformities, strength intact in all 4 Skin: moist, warm, no rashes Neurological: no tremor with outstretched hands, DTR normal in all 4  ASSESSMENT: 1. DM2, insulin-dependent, uncontrolled, with complications - CKD stage 4 - CHF - + DR  2. Hypothyroidism  3. Hyperlipidemia  PLAN:  1. Patient with longstanding, uncontrolled, type 2 diabetes, insulin-dependent, previously on premixed insulin regimen and now on a basal-bolus insulin regimen.  She was also previously on a GLP-1 receptor agonist but she was residing in [facility] facility for a period of time and the facility started her GLP-1 receptor agonist as she was not eating well.  Indeed, she lost a significant amount of weight before last visit, in the previous year, approximately 40 pounds.   Therefore, we continued off the GLP-1 receptor agonist.  At last visit, she just moved home and she was started on palliative care.  At that time, sugars were high throughout the day and she was telling me that her appetite was back.  We increased her insulin doses and I advised her to use different doses based on the size and composition of her meals.  At that time I discussed with the patient, her age, and her daughter, about how to adjust the doses of Humalog. CGM interpretation: -At today's visit, with her CGM tracings along with patient and her daughter.  Approximately 74% of the values are in target range, which is at goal, but slightly lower percentage than at last visit.  Upon questioning, it appears that patient's daughter was out of town for one of the last 2 weeks and in this period of time, patient had more starches and sugars were more fluctuating.  Reviewing individual traces, the higher blood sugars appear to be after dinner.  However, when daughter is home (which is most of the time), she controls patient's diet and her sugars appear quite controlled, without significant peaks.  This is confirmed by her excellent HbA1c today: 6.3% (lower) -Reviewing her blood sugars at night, they appear to be quite stable at the upper end of  the target interval.  Due to her age and the long duration of her diabetes, I would like to avoid low blood sugars and we discussed that keeping her blood sugars in the higher normal range at night is actually more desirable than risking low blood sugars.  They agree with the plan. - I suggested to:  Patient Instructions  Please continue:   - Lantus 46 units   - Humalog 13-17 units 3x a day before meals  Please continue levothyroxine 112 mcg daily  Take the thyroid hormone every day, with water, at least 30 minutes before breakfast, separated by at least 4 hours from: - acid reflux medications - calcium - iron - multivitamins  Please stop at the lab.  Please  return in 3-4 months with your freestyle libre CGM  - advised to check sugars at different times of the day - 4x a day, rotating check times - advised for yearly eye exams >> she is UTD - return to clinic in 3-4 months  2. HL -Reviewed latest lipid panel from 05/2018: LDL at goal, triglycerides high, HDL low: Lab Results  Component Value Date   CHOL 131 05/19/2018   HDL 26.70 (L) 05/19/2018   LDLCALC 65 08/16/2009   LDLDIRECT 41.0 05/19/2018   TRIG (H) 05/19/2018    656.0 Triglyceride is over 400; calculations on Lipids are invalid.   CHOLHDL 5 05/19/2018  -She continues on fenofibrate and statin without side effects -She is due for another lipid panel -  this is already ordered by PCP and the patient will have it either today or next day  3.  Hypothyroidism  - latest thyroid labs reviewed with pt >> normal: Lab Results  Component Value Date   TSH 0.355 07/20/2019   - she continues on LT4 112 mcg daily - pt feels good on this dose. - we discussed about taking the thyroid hormone every day, with water, >30 minutes before breakfast, separated by >4 hours from acid reflux medications, calcium, iron, multivitamins. Pt. is taking it correctly.  At last visit she was taking omeprazole to close to levothyroxine and I advised her to move this more than 4 hours later.  She actually ended up stopping this.  No acid reflux symptoms. - will check thyroid tests at next lab draw: TSH and fT4 - If labs are abnormal, she will need to return for repeat TFTs in 1.5 months  Philemon Kingdom, MD PhD [facility] Endocrinology

## 2020-05-20 ENCOUNTER — Ambulatory Visit: Payer: Medicare Other | Admitting: Physical Therapy

## 2020-05-20 ENCOUNTER — Other Ambulatory Visit: Payer: Self-pay

## 2020-05-20 DIAGNOSIS — R262 Difficulty in walking, not elsewhere classified: Secondary | ICD-10-CM | POA: Diagnosis not present

## 2020-05-20 DIAGNOSIS — M6281 Muscle weakness (generalized): Secondary | ICD-10-CM

## 2020-05-20 DIAGNOSIS — G8929 Other chronic pain: Secondary | ICD-10-CM

## 2020-05-20 DIAGNOSIS — R2689 Other abnormalities of gait and mobility: Secondary | ICD-10-CM

## 2020-05-20 NOTE — Therapy (Signed)
[place], [place], [address] Phone: [phone]   Fax:  [phone]  Physical Therapy Treatment  Patient Details  Name: [name] MRN: [id number] Date of Birth: [birth year] Referring Provider (PT): Mcarthur Rossetti, MD   Encounter Date: 05/20/2020   PT End of Session - 05/20/20 1559    Visit Number 19    Number of Visits 24    Date for PT Re-Evaluation 05/28/20    PT Start Time 9379    PT Stop Time 1523    PT Time Calculation (min) 50 min    Activity Tolerance Patient tolerated treatment well    Behavior During Therapy [facility] for tasks assessed/performed           Past Medical History:  Diagnosis Date  . Allergic rhinitis   . Anxiety state, unspecified    panic attacks  . CHF (congestive heart failure) ([place])   . Chronic respiratory failure with hypoxia ([place]) 07/03/2019  . Depressive disorder, not elsewhere classified   . Extrinsic asthma, unspecified    no problem since adulthood  . HCAP (healthcare-associated pneumonia)   . Obesity   . OSA on CPAP    severe  . Pneumonitis 07/14/2019  . Pressure injury of skin 07/10/2019  . Pure hypercholesterolemia   . Respiratory failure ([place]) 08/24/2015  . Respiratory failure with hypoxia ([place]) 09/2008   acute, secondary to multiple bilateral pulmonary embolism , negative hypercoagulable workup 09/2008 hospital stay  . Scoliosis   . Type II or unspecified type diabetes mellitus without mention of complication, not stated as uncontrolled   . Unspecified essential hypertension   . Unspecified hypothyroidism    hypo    Past Surgical History:  Procedure Laterality Date  . CARDIOVERSION N/A 06/26/2019   Procedure: CARDIOVERSION;  Surgeon: Jerline Pain, MD;  Location: [facility] ENDOSCOPY;  Service: Cardiovascular;  Laterality: N/A;  . EYE SURGERY Bilateral 2005   cataracts, implants  . KIDNEY STONE SURGERY Bilateral    2016, 2017   . knee  replaced Left 2013  . THYROID SURGERY  1966   nodule removal; partial thyroidectomy; radioactive iodine x2; 1990's    There were no vitals filed for this visit.   Subjective Assessment - 05/20/20 1558    Subjective Pt states she's been doing well. She's been using the blue tbands at home.    Limitations Walking;Lifting;House hold activities    How long can you walk comfortably? Needs rest breaks; limited due to respiratory status    Patient Stated Goals Return back to PLOF; less reliant on rollator    Currently in Pain? No/denies                             [facility] Adult PT Treatment/Exercise - 05/20/20 0001      Knee/Hip Exercises: Machines for Strengthening   Total Gym Leg Press 70# 2x10, 50# x10      Knee/Hip Exercises: Standing   Gait Training In parallel bars: 2x20'; cueing for arm swing and foot placement    Other Standing Knee Exercises modified deadlift 2x10 with #10    Other Standing Knee Exercises Farmer's carry 2lbs 2x20'                    PT Short Term Goals - 04/09/20 1704      PT SHORT TERM GOAL #1   Title be independent in initial HEP  Baseline reviwed    Status Achieved      PT SHORT TERM GOAL #2   Title walk for 4-5 min without fatigue to assist with community ambulation with rollator    Baseline Pt with SOB noted after walking from lobby to gym    Time 3    Period Weeks    Status Achieved      PT SHORT TERM GOAL #3   Title -    Baseline -      PT SHORT TERM GOAL #4   Title -    Baseline -             PT Long Term Goals - 04/16/20 1525      PT LONG TERM GOAL #1   Title be independent in advanced HEP    Status Achieved      PT LONG TERM GOAL #2   Title Pt will improve 5 times sit<>stand to <13 sec for decreased fall risk    Baseline 16 sec    Time 6    Period Weeks    Status On-going    Target Date 05/28/20      PT LONG TERM GOAL #3   Title Pt will be able to amb with normal reciprocal gait pattern  without a/d for ~50'    Status Achieved      PT LONG TERM GOAL #4   Title Pt will demonstrate at least 4+/5 bilat LE strength bilat    Status Achieved      PT LONG TERM GOAL #5   Title Pt will have improved FOTO score to 31% impairment    Time 6    Period Weeks    Status On-going    Target Date 05/28/20      PT LONG TERM GOAL #6   Title Pt will be able to amb x 8-10 min with rollator MI without rest break to assist with community ambulation    Baseline 5 minutes    Time 6    Period Weeks    Status On-going    Target Date 05/28/20                 Plan - 05/20/20 1603    Clinical Impression Statement Treatment continued to focus on improving pt's strength and endurance. Initiated deadlift form. Continued practice with ambulating without rollator. Ongoing discussion with pt in regards to transitioning into community wellness programs.    Personal Factors and Comorbidities Age;Comorbidity 1;Comorbidity 2;Comorbidity 3+    Comorbidities osteoporosis, chronic R knee OA/pain, diabetes with neuropathy    Examination-Activity Limitations Caring for Others;Carry;Lift;Locomotion Level;Squat;Stairs;Stand    Examination-Participation Restrictions Cleaning;Community Activity    Stability/Clinical Decision Making Evolving/Moderate complexity    Rehab Potential Good    PT Frequency 2x / week    PT Duration 6 weeks    PT Treatment/Interventions ADLs/Self Care Home Management;Aquatic Therapy;Electrical Stimulation;Cryotherapy;Iontophoresis 4mg /ml Dexamethasone;Moist Heat;Ultrasound;Gait training;Stair training;Functional mobility training;Therapeutic activities;Therapeutic exercise;Balance training;Neuromuscular re-education;Patient/family education;Manual techniques;Dry needling;Energy conservation;Taping;Vasopneumatic Device    PT Next Visit Plan Continue to progress endurance and bilat LE strengthening and balance. Continue strengthening UEs for pt to feel less tired pushing rollator with  ambulation. Prep patient for community wellness    PT Home Exercise Plan Access Code: [email or web address]. forwards/backwards walking from counter    Consulted and Agree with Plan of Care Patient           Patient will benefit from skilled therapeutic intervention in order to improve the following deficits and impairments:  Difficulty walking, Cardiopulmonary status limiting activity, Decreased endurance, Decreased activity tolerance, Pain, Decreased balance, Decreased mobility, Decreased strength, Increased edema  Visit Diagnosis: Difficulty in walking, not elsewhere classified  Muscle weakness (generalized)  Chronic pain of right knee  Other abnormalities of gait and mobility     Problem List Patient Active Problem List   Diagnosis Date Noted  . Atypical atrial flutter ([place])   . Normocytic anemia 05/18/2017  . Chronic ITP (idiopathic thrombocytopenia) ([facility]) 01/25/2017  . Chronic kidney disease (CKD) stage G4/A1, severely decreased glomerular filtration rate (GFR) between 15-29 mL/min/1.73 square meter and albuminuria creatinine ratio less than 30 mg/g ([facility]) 09/04/2015  . GOUT 06/23/2010  . DERMATITIS 01/16/2010  . BACK PAIN 06/13/2009  . MYALGIA 06/13/2009  . Anxiety state 12/13/2008  . LEG EDEMA, CHRONIC 11/08/2008  . Sleep apnea 10/26/2008  . Hypothyroidism 09/27/2008  . HYPERCHOLESTEROLEMIA 09/27/2008  . OBESITY 09/27/2008  . DEPRESSION 09/27/2008  . Essential hypertension 09/27/2008  . ALLERGIC RHINITIS 09/27/2008  . ASTHMA, CHILDHOOD 09/27/2008    [facility] [address] PT, DPT 05/20/2020, 5:27 PM  [facility] [address] [place], [place], [address] Phone: [phone]   Fax:  [phone]  Name: [name] MRN: [id number] Date of Birth: 1935/04/02

## 2020-05-21 ENCOUNTER — Other Ambulatory Visit: Payer: Self-pay

## 2020-05-21 ENCOUNTER — Other Ambulatory Visit (INDEPENDENT_AMBULATORY_CARE_PROVIDER_SITE_OTHER): Payer: Medicare Other

## 2020-05-21 DIAGNOSIS — E890 Postprocedural hypothyroidism: Secondary | ICD-10-CM

## 2020-05-21 LAB — T4, FREE: Free T4: 0.68 ng/dL (ref 0.60–1.60)

## 2020-05-21 LAB — TSH: TSH: 0.62 u[IU]/mL (ref 0.35–4.50)

## 2020-05-22 ENCOUNTER — Ambulatory Visit: Payer: Medicare Other | Admitting: Physical Therapy

## 2020-05-22 ENCOUNTER — Other Ambulatory Visit: Payer: Self-pay

## 2020-05-22 DIAGNOSIS — R262 Difficulty in walking, not elsewhere classified: Secondary | ICD-10-CM | POA: Diagnosis not present

## 2020-05-22 DIAGNOSIS — M25561 Pain in right knee: Secondary | ICD-10-CM

## 2020-05-22 DIAGNOSIS — R2689 Other abnormalities of gait and mobility: Secondary | ICD-10-CM

## 2020-05-22 DIAGNOSIS — G8929 Other chronic pain: Secondary | ICD-10-CM

## 2020-05-22 DIAGNOSIS — M6281 Muscle weakness (generalized): Secondary | ICD-10-CM

## 2020-05-22 NOTE — Therapy (Signed)
[facility] [place], [place], [address] Phone: [phone]   Fax:  [phone]  Physical Therapy Treatment  Patient Details  Name: [name] MRN: [id number] Date of Birth: 12-Nov-1934 Referring Provider (PT): Mcarthur Rossetti, MD   Encounter Date: 05/22/2020   Progress Note Reporting Period 03/04/20 to 05/22/20  See note below for Objective Data and Assessment of Progress/Goals.       PT End of Session - 05/22/20 1616    Visit Number 20    Number of Visits 24    Date for PT Re-Evaluation 05/28/20    Authorization Type MCR -- needs 10th visit progress note    Progress Note Due on Visit 20    PT Start Time 1338    PT Stop Time 1420    PT Time Calculation (min) 42 min    Activity Tolerance Patient tolerated treatment well    Behavior During Therapy WFL for tasks assessed/performed           Past Medical History:  Diagnosis Date  . Allergic rhinitis   . Anxiety state, unspecified    panic attacks  . CHF (congestive heart failure) ([place])   . Chronic respiratory failure with hypoxia ([place]) 07/03/2019  . Depressive disorder, not elsewhere classified   . Extrinsic asthma, unspecified    no problem since adulthood  . HCAP (healthcare-associated pneumonia)   . Obesity   . OSA on CPAP    severe  . Pneumonitis 07/14/2019  . Pressure injury of skin 07/10/2019  . Pure hypercholesterolemia   . Respiratory failure ([place]) 08/24/2015  . Respiratory failure with hypoxia ([place]) 09/2008   acute, secondary to multiple bilateral pulmonary embolism , negative hypercoagulable workup 09/2008 hospital stay  . Scoliosis   . Type II or unspecified type diabetes mellitus without mention of complication, not stated as uncontrolled   . Unspecified essential hypertension   . Unspecified hypothyroidism    hypo    Past Surgical History:  Procedure Laterality Date  . CARDIOVERSION N/A 06/26/2019   Procedure:  CARDIOVERSION;  Surgeon: Jerline Pain, MD;  Location: [facility] ENDOSCOPY;  Service: Cardiovascular;  Laterality: N/A;  . EYE SURGERY Bilateral 2005   cataracts, implants  . KIDNEY STONE SURGERY Bilateral    2016, 2017   . knee replaced Left 2013  . THYROID SURGERY  1966   nodule removal; partial thyroidectomy; radioactive iodine x2; 1990's    There were no vitals filed for this visit.                      [facility] Adult PT Treatment/Exercise - 05/22/20 0001      Knee/Hip Exercises: Aerobic   Other Aerobic 6 min walking with rollator, 1 standing rest break for 469'      Knee/Hip Exercises: Standing   Gait Training cueing for arm swing, decrease trunk sway, and foot placement 3x40' with no a/d      Knee/Hip Exercises: Seated   Sit to Sand without UE support;5 reps;3 sets   14 sec on 1st set, 11.47 sec on 2nd set, 10.06 sec on 3rd se     Shoulder Exercises: Seated   Other Seated Exercises Lat pull down 2x10 15#    Other Seated Exercises chest fly 2x10 10#                    PT Short Term Goals - 04/09/20 1704      PT SHORT TERM  GOAL #1   Title be independent in initial HEP    Baseline reviwed    Status Achieved      PT SHORT TERM GOAL #2   Title walk for 4-5 min without fatigue to assist with community ambulation with rollator    Baseline Pt with SOB noted after walking from lobby to gym    Time 3    Period Weeks    Status Achieved      PT SHORT TERM GOAL #3   Title -    Baseline -      PT SHORT TERM GOAL #4   Title -    Baseline -             PT Long Term Goals - 05/22/20 1544      PT LONG TERM GOAL #1   Title be independent in advanced HEP    Status Achieved      PT LONG TERM GOAL #2   Title Pt will improve 5 times sit<>stand to <13 sec for decreased fall risk    Baseline 16 sec    Time 6    Period Weeks    Status Achieved      PT LONG TERM GOAL #3   Title Pt will be able to amb with normal reciprocal gait pattern without a/d  for ~50'    Status Achieved      PT LONG TERM GOAL #4   Title Pt will demonstrate at least 4+/5 bilat LE strength bilat    Status Achieved      PT LONG TERM GOAL #5   Title Pt will have improved FOTO score to 31% impairment    Time 6    Period Weeks    Status On-going      PT LONG TERM GOAL #6   Title Pt will be able to amb x 8-10 min with rollator MI without rest break to assist with community ambulation    Baseline 5 minutes    Time 6    Period Weeks    Status On-going                 Plan - 05/22/20 1617    Clinical Impression Statement Pt with improving endurance overall. Pt with improved 5x STS to <13 sec. Pt's gait without a/d is improving but still requires cues for arm swing and R foot placement. Treatment focused on endurance for LE and UE. Initiated machine strengthening for UEs this session to prep patient to transition to community exercise regimen.    Personal Factors and Comorbidities Age;Comorbidity 1;Comorbidity 2;Comorbidity 3+    Comorbidities osteoporosis, chronic R knee OA/pain, diabetes with neuropathy    Examination-Activity Limitations Caring for Others;Carry;Lift;Locomotion Level;Squat;Stairs;Stand    Examination-Participation Restrictions Cleaning;Community Activity    Stability/Clinical Decision Making Evolving/Moderate complexity    Rehab Potential Good    PT Frequency 2x / week    PT Duration 6 weeks    PT Treatment/Interventions ADLs/Self Care Home Management;Aquatic Therapy;Electrical Stimulation;Cryotherapy;Iontophoresis 4mg /ml Dexamethasone;Moist Heat;Ultrasound;Gait training;Stair training;Functional mobility training;Therapeutic activities;Therapeutic exercise;Balance training;Neuromuscular re-education;Patient/family education;Manual techniques;Dry needling;Energy conservation;Taping;Vasopneumatic Device    PT Next Visit Plan Continue to progress deadlift. Continue to progress endurance and bilat LE strengthening and balance. Continue  strengthening UEs for pt to feel less tired pushing rollator with ambulation. Prep patient for community wellness    PT Home Exercise Plan Access Code: [email or web address]. forwards/backwards walking from counter    Consulted and Agree with Plan of Care Patient  Patient will benefit from skilled therapeutic intervention in order to improve the following deficits and impairments:  Difficulty walking, Cardiopulmonary status limiting activity, Decreased endurance, Decreased activity tolerance, Pain, Decreased balance, Decreased mobility, Decreased strength, Increased edema  Visit Diagnosis: Difficulty in walking, not elsewhere classified  Muscle weakness (generalized)  Chronic pain of right knee  Other abnormalities of gait and mobility     Problem List Patient Active Problem List   Diagnosis Date Noted  . Atypical atrial flutter ([place])   . Normocytic anemia 05/18/2017  . Chronic ITP (idiopathic thrombocytopenia) ([facility]) 01/25/2017  . Chronic kidney disease (CKD) stage G4/A1, severely decreased glomerular filtration rate (GFR) between 15-29 mL/min/1.73 square meter and albuminuria creatinine ratio less than 30 mg/g ([facility]) 09/04/2015  . GOUT 06/23/2010  . DERMATITIS 01/16/2010  . BACK PAIN 06/13/2009  . MYALGIA 06/13/2009  . Anxiety state 12/13/2008  . LEG EDEMA, CHRONIC 11/08/2008  . Sleep apnea 10/26/2008  . Hypothyroidism 09/27/2008  . HYPERCHOLESTEROLEMIA 09/27/2008  . OBESITY 09/27/2008  . DEPRESSION 09/27/2008  . Essential hypertension 09/27/2008  . ALLERGIC RHINITIS 09/27/2008  . ASTHMA, CHILDHOOD 09/27/2008    Estill Bamberg April Gordy Levan 05/22/2020, 5:25 PM  [facility] [address] [place], [place], [address] Phone: [phone]   Fax:  [phone]  Name: [name] MRN: [id number] Date of Birth: 1935/06/12

## 2020-05-27 ENCOUNTER — Ambulatory Visit (INDEPENDENT_AMBULATORY_CARE_PROVIDER_SITE_OTHER): Payer: Medicare Other | Admitting: Adult Health

## 2020-05-27 ENCOUNTER — Encounter: Payer: Self-pay | Admitting: Adult Health

## 2020-05-27 ENCOUNTER — Ambulatory Visit: Payer: Medicare Other | Admitting: Physical Therapy

## 2020-05-27 ENCOUNTER — Other Ambulatory Visit: Payer: Self-pay

## 2020-05-27 ENCOUNTER — Ambulatory Visit (INDEPENDENT_AMBULATORY_CARE_PROVIDER_SITE_OTHER): Payer: Medicare Other

## 2020-05-27 VITALS — BP 148/76 | HR 95 | Temp 97.1°F | Ht 61.0 in | Wt 218.2 lb

## 2020-05-27 DIAGNOSIS — R262 Difficulty in walking, not elsewhere classified: Secondary | ICD-10-CM | POA: Diagnosis not present

## 2020-05-27 DIAGNOSIS — R0600 Dyspnea, unspecified: Secondary | ICD-10-CM | POA: Diagnosis not present

## 2020-05-27 DIAGNOSIS — R2689 Other abnormalities of gait and mobility: Secondary | ICD-10-CM

## 2020-05-27 DIAGNOSIS — G8929 Other chronic pain: Secondary | ICD-10-CM

## 2020-05-27 DIAGNOSIS — J9611 Chronic respiratory failure with hypoxia: Secondary | ICD-10-CM | POA: Diagnosis not present

## 2020-05-27 DIAGNOSIS — G4733 Obstructive sleep apnea (adult) (pediatric): Secondary | ICD-10-CM

## 2020-05-27 DIAGNOSIS — Z23 Encounter for immunization: Secondary | ICD-10-CM

## 2020-05-27 DIAGNOSIS — Z6841 Body Mass Index (BMI) 40.0 and over, adult: Secondary | ICD-10-CM

## 2020-05-27 DIAGNOSIS — M6281 Muscle weakness (generalized): Secondary | ICD-10-CM

## 2020-05-27 NOTE — Progress Notes (Signed)
@Patient  ID: [name], female    DOB: [birth year], 85 y.o.   MRN: [id number]  Chief Complaint  Patient presents with  . Follow-up    Referring provider: Javier Glazier, MD  HPI: 85 year old female former smoker followed for severe obstructive sleep apnea on nocturnal CPAP Previous admission in November 2021 with acute respiratory failure with hypoxemia bilateral infiltrates consistent with pneumonitis that were responsive to steroids. Medical history significant for chronic kidney disease, ITP, A. fib on Eliquis and a history of PE in 2010  TEST/EVENTS :  Diagnostic PSG in 8/09 showed severe obstructive sleep apnea with AHI 85/h, TST 121 mins - no REM sleep. Subsequent titration study showed CPAP requirement of 15 cm with small comfort gel mask  05/27/2020 Follow up : OSA and Pulmonary infiltrates  Patient presents for a 7-month follow-up.  Patient has underlying severe obstructive sleep apnea previously was on CPAP.  Patient says she has not worn her CPAP for almost 1 year.  She did have a prolonged hospitalization November 2020 with acute respiratory failure with hypoxemia and bilateral infiltrates consistent with pneumonitis.  Patient required a prolonged recovery.  Patient says she relates her illness to her CPAP she is convinced that this caused her to be sick.  Patient says she has not worn her CPAP and does not want to start.  Long discussion regarding CPAP care and potential complications of untreated sleep apnea however patient says she is not going to start back on her CPAP.  Patient says that she has been doing much better over the last 6 months.  Her strength is improved.  She has been doing physical therapy.  She had been on oxygen 2 L with activity however says she has not worn any oxygen and greater than 6 months.  Says that when she checks her oxygen walking she does not desaturate.  She also says that she checks her oxygen during physical therapy sessions and  has had no desaturations.  Patient says she does get winded with some activities but overall feels that she is doing much better.  She is now back home and is living with her daughter.    Allergies  Allergen Reactions  . Adhesive [Tape] Other (See Comments)    PULLS OFF THE SKIN  . Apixaban Other (See Comments)    Internal Bleeding  . Aspirin Itching, Rash, Hives and Swelling    Swelling of her tongue  . Mirabegron Other (See Comments)    Patient experienced A-Fib  . Pine[place] Anaphylaxis and Swelling    Throat swells and blisters on tongue and roof of mouth per patient  . Metformin Diarrhea and Nausea Only  . Tetracycline Hives  . Fluticasone-Salmeterol Itching and Rash  . Iodinated Diagnostic Agents Itching and Rash       . Lactose Intolerance (Gi) Other (See Comments)    Gas   . Latex Itching, Rash and Other (See Comments)    Pulls off the skin and causes welts  . Lisinopril Cough  . Metronidazole Other (See Comments)    Reaction not known  . Sulfa Antibiotics Rash  . Sulfonamide Derivatives Itching and Rash    Immunization History  Administered Date(s) Administered  . Fluad Quad(high Dose 65+) 05/27/2020  . Influenza Split 06/09/2011  . Influenza Whole 07/23/2008, 06/13/2009, 06/07/2012  . Influenza, High Dose Seasonal PF 05/08/2015, 05/19/2018  . Moderna SARS-COVID-2 Vaccination 09/18/2019, 10/16/2019  . Pneumococcal Conjugate-13 12/15/2016  . Pneumococcal Polysaccharide-23 01/30/2008  . Pneumococcal-Unspecified 09/07/2013  .  Td 01/25/2006  . Zoster 06/09/2011    Past Medical History:  Diagnosis Date  . Allergic rhinitis   . Anxiety state, unspecified    panic attacks  . CHF (congestive heart failure) ([place])   . Chronic respiratory failure with hypoxia ([place]) 07/03/2019  . Depressive disorder, not elsewhere classified   . Extrinsic asthma, unspecified    no problem since adulthood  . HCAP (healthcare-associated pneumonia)   . Obesity   . OSA on CPAP      severe  . Pneumonitis 07/14/2019  . Pressure injury of skin 07/10/2019  . Pure hypercholesterolemia   . Respiratory failure ([place]) 08/24/2015  . Respiratory failure with hypoxia ([place]) 09/2008   acute, secondary to multiple bilateral pulmonary embolism , negative hypercoagulable workup 09/2008 hospital stay  . Scoliosis   . Type II or unspecified type diabetes mellitus without mention of complication, not stated as uncontrolled   . Unspecified essential hypertension   . Unspecified hypothyroidism    hypo    Tobacco History: Social History   Tobacco Use  Smoking Status Former Smoker  . Packs/day: 0.25  . Years: 20.00  . Pack years: 5.00  . Types: Cigarettes  . Quit date: 09/07/1989  . Years since quitting: 30.7  Smokeless Tobacco Never Used   Counseling given: Not Answered   Outpatient Medications Prior to Visit  Medication Sig Dispense Refill  . acetaminophen (TYLENOL) 325 MG tablet Take 650 mg by mouth every 6 (six) hours as needed for moderate pain or fever (for fever or pain/discomfort).     . Amino Acids-Protein Hydrolys (FEEDING SUPPLEMENT, PRO-STAT SUGAR FREE 64,) LIQD Take 30 mLs by mouth 2 (two) times daily. 887 mL 0  . atorvastatin (LIPITOR) 40 MG tablet Take 40 mg by mouth at bedtime.     . bisacodyl (DULCOLAX) 5 MG EC tablet Take 10 mg by mouth every 3 (three) days as needed (for constipation).     . Cholecalciferol (VITAMIN D3) 2000 units TABS Take 2,000 Units by mouth daily.    . Continuous Blood Gluc Sensor (FREESTYLE LIBRE 14 DAY SENSOR) MISC Apply new sensor every 14 days. 6 each 3  . fenofibrate 54 MG tablet TAKE 1 TABLET(54 MG) BY MOUTH DAILY 90 tablet 1  . fexofenadine (ALLEGRA) 180 MG tablet Take 180 mg by mouth daily.    . furosemide (LASIX) 20 MG tablet Take 1 tablet (20 mg total) by mouth daily. Take 20 mg by mouth in the morning on Sun/Tues/Thurs/Sat 40mg  on Mon/Wed/Friday 140 tablet 2  . hyoscyamine (LEVSIN) 0.125 MG tablet Take 1 tablet by mouth in the  morning and at bedtime.     . insulin glargine (LANTUS SOLOSTAR) 100 UNIT/ML Solostar Pen Inject 50 Units into the skin daily. (Patient taking differently: Inject 46 Units into the skin daily. ) 10 pen 3  . insulin lispro (HUMALOG KWIKPEN) 100 UNIT/ML KwikPen Inject 0.13-0.2 mLs (13-20 Units total) into the skin with breakfast, with lunch, and with evening meal. 10 pen 3  . Insulin Pen Needle 32G X 4 MM MISC Use 4x a day 200 each 3  . Insulin Syringe-Needle U-100 (INSULIN SYRINGE .3CC/29GX1/2") 29G X 1/2" 0.3 ML MISC Use to inject insulin 4 times a day. 400 each 3  . levothyroxine (SYNTHROID) 112 MCG tablet TAKE 1 TABLET BY MOUTH EVERY DAY 90 tablet 1  . Multiple Vitamin (MULTIVITAMIN) tablet Take 1 tablet by mouth daily.      . potassium citrate (UROCIT-K) 10 MEQ (1080 MG) SR tablet  Take 20 mEq by mouth in the morning and at bedtime.     . sertraline (ZOLOFT) 50 MG tablet TAKE 1 TABLET BY MOUTH AT BEDTIME 90 tablet 1  . vitamin C (ASCORBIC ACID) 250 MG tablet Take 250 mg by mouth daily.     No facility-administered medications prior to visit.     Review of Systems:   Constitutional:   No  weight loss, night sweats,  Fevers, chills, fatigue, or  lassitude.  HEENT:   No headaches,  Difficulty swallowing,  Tooth/dental problems, or  Sore throat,                No sneezing, itching, ear ache, nasal congestion, post nasal drip,   CV:  No chest pain,  Orthopnea, PND, +swelling in lower extremities,  No anasarca, dizziness, palpitations, syncope.   GI  No heartburn, indigestion, abdominal pain, nausea, vomiting, diarrhea, change in bowel habits, loss of appetite, bloody stools.   Resp: No shortness of breath with exertion or at rest.  No excess mucus, no productive cough,  No non-productive cough,  No coughing up of blood.  No change in color of mucus.  No wheezing.  No chest wall deformity  Skin: no rash or lesions.  GU: no dysuria, change in color of urine, no urgency or frequency.  No  flank pain, no hematuria   MS:  No joint pain or swelling.  No decreased range of motion.  No back pain.    Physical Exam  BP (!) 148/76 (BP Location: Left Arm, Cuff Size: Normal)   Pulse 95   Temp (!) 97.1 F (36.2 C) (Temporal)   Ht 5\' 1"  (1.549 m)   Wt 218 lb 3.2 oz (99 kg)   LMP  (LMP Unknown)   SpO2 91% Comment: RA  BMI 41.23 kg/m   GEN: A/Ox3; pleasant , NAD, well nourished    HEENT:  [place]/AT,   NOSE-clear, THROAT-clear, no lesions, no postnasal drip or exudate noted.   NECK:  Supple w/ fair ROM; no JVD; normal carotid impulses w/o bruits; no thyromegaly or nodules palpated; no lymphadenopathy.    RESP  Clear  P & A; w/o, wheezes/ rales/ or rhonchi. no accessory muscle use, no dullness to percussion  CARD:  RRR, no m/r/g, 1+  peripheral edema, pulses intact, no cyanosis or clubbing.  GI:   Soft & nt; nml bowel sounds; no organomegaly or masses detected.   Musco: Warm bil, no deformities or joint swelling noted.   Neuro: alert, no focal deficits noted.    Skin: Warm, no lesions or rashes    Lab Results:   Imaging: DG Chest 2 View  Result Date: 05/27/2020 CLINICAL DATA:  Shortness of breath EXAM: CHEST - 2 VIEW COMPARISON:  August 07, 2019 FINDINGS: There is no edema or airspace opacity. There is interstitial and central peribronchial thickening. There is cardiomegaly with pulmonary venous hypertension. No evident adenopathy. There is aortic atherosclerosis. There is lower thoracic levoscoliosis. IMPRESSION: Interstitial and central peribronchial thickening, findings likely indicative chronic bronchitis. No edema or airspace opacity. There is cardiomegaly with pulmonary vascular congestion. Aortic Atherosclerosis (ICD10-I70.0). Electronically Signed   By: Lowella Grip III M.D.   On: 05/27/2020 12:04      No flowsheet data found.  No results found for: NITRICOXIDE      Assessment & Plan:   Sleep apnea Severe obstructive sleep apnea Long discussion  with patient regarding potential complications of untreated sleep apnea including cardiovascular however patient says she  has not going to use CPAP and does not want to wear this any longer  Plan  Patient Instructions  Flu shot today.  Chest x-ray today Call back if you change your mind about CPAP  May use Oxygen 2l/m with activity and At bedtime  .  Goal is to keep oxygen level >90% .  Activity as tolerated.  Follow up with Dr. Elsworth Soho  In 6 months and As needed   Please contact office for sooner follow up if symptoms do not improve or worsen or seek emergency care       Chronic respiratory failure with hypoxia ([place]) Recommend using oxygen to keep O2 saturations greater than 88 to 90%.  Plan  Patient Instructions  Flu shot today.  Chest x-ray today Call back if you change your mind about CPAP  May use Oxygen 2l/m with activity and At bedtime  .  Goal is to keep oxygen level >90% .  Activity as tolerated.  Follow up with Dr. Elsworth Soho  In 6 months and As needed   Please contact office for sooner follow up if symptoms do not improve or worsen or seek emergency care       OBESITY Weight loss      Rexene Edison, NP 05/27/2020

## 2020-05-27 NOTE — Assessment & Plan Note (Signed)
Severe obstructive sleep apnea Long discussion with patient regarding potential complications of untreated sleep apnea including cardiovascular however patient says she has not going to use CPAP and does not want to wear this any longer  Plan  Patient Instructions  Flu shot today.  Chest x-ray today Call back if you change your mind about CPAP  May use Oxygen 2l/m with activity and At bedtime  .  Goal is to keep oxygen level >90% .  Activity as tolerated.  Follow up with Dr. Elsworth Soho  In 6 months and As needed   Please contact office for sooner follow up if symptoms do not improve or worsen or seek emergency care

## 2020-05-27 NOTE — Assessment & Plan Note (Signed)
-   Weight loss 

## 2020-05-27 NOTE — Therapy (Signed)
[place], [place], [address] Phone: [phone]   Fax:  [phone]  Physical Therapy Treatment and Discharge  Patient Details  Name: [name] MRN: [id number] Date of Birth: [birth year] Referring Provider (PT): Mcarthur Rossetti, MD   Encounter Date: 05/27/2020   PT End of Session - 05/27/20 1612    Visit Number 21    Number of Visits 24    Date for PT Re-Evaluation 05/28/20    Authorization Type MCR -- needs 10th visit progress note    Progress Note Due on Visit 20    PT Start Time 1535    PT Stop Time 1615    PT Time Calculation (min) 40 min    Activity Tolerance Patient tolerated treatment well    Behavior During Therapy [facility] for tasks assessed/performed           Past Medical History:  Diagnosis Date  . Allergic rhinitis   . Anxiety state, unspecified    panic attacks  . CHF (congestive heart failure) ([place])   . Chronic respiratory failure with hypoxia ([place]) 07/03/2019  . Depressive disorder, not elsewhere classified   . Extrinsic asthma, unspecified    no problem since adulthood  . HCAP (healthcare-associated pneumonia)   . Obesity   . OSA on CPAP    severe  . Pneumonitis 07/14/2019  . Pressure injury of skin 07/10/2019  . Pure hypercholesterolemia   . Respiratory failure ([place]) 08/24/2015  . Respiratory failure with hypoxia ([place]) 09/2008   acute, secondary to multiple bilateral pulmonary embolism , negative hypercoagulable workup 09/2008 hospital stay  . Scoliosis   . Type II or unspecified type diabetes mellitus without mention of complication, not stated as uncontrolled   . Unspecified essential hypertension   . Unspecified hypothyroidism    hypo    Past Surgical History:  Procedure Laterality Date  . CARDIOVERSION N/A 06/26/2019   Procedure: CARDIOVERSION;  Surgeon: Jerline Pain, MD;  Location: [facility] ENDOSCOPY;  Service: Cardiovascular;  Laterality: N/A;  . EYE  SURGERY Bilateral 2005   cataracts, implants  . KIDNEY STONE SURGERY Bilateral    2016, 2017   . knee replaced Left 2013  . THYROID SURGERY  1966   nodule removal; partial thyroidectomy; radioactive iodine x2; 1990's    There were no vitals filed for this visit.       [facility] PT Assessment - 05/27/20 0001      Balance Screen   Has the patient fallen in the past 6 months No      Observation/Other Assessments   Focus on Therapeutic Outcomes (FOTO)  36%      Strength   Right Hip Flexion 4+/5   Limited ROM due to body habitus   Right Hip Extension 4+/5    Right Hip ABduction 5/5    Right Knee Flexion 5/5    Right Knee Extension 5/5    Right Ankle Dorsiflexion 4+/5    Right Ankle Plantar Flexion 4+/5                         [facility] Adult PT Treatment/Exercise - 05/27/20 0001      Knee/Hip Exercises: Aerobic   Other Aerobic 8 min walk limited to 5 min due to pt drop in glucose to 93   363 feet in 5 min; 632' in total for 8 min     Knee/Hip Exercises: Standing   Gait Training cueing for arm swing,  decrease trunk sway, and foot placement x100' with no a/d                    PT Short Term Goals - 04/09/20 1704      PT SHORT TERM GOAL #1   Title be independent in initial HEP    Baseline reviwed    Status Achieved      PT SHORT TERM GOAL #2   Title walk for 4-5 min without fatigue to assist with community ambulation with rollator    Baseline Pt with SOB noted after walking from lobby to gym    Time 3    Period Weeks    Status Achieved      PT SHORT TERM GOAL #3   Title -    Baseline -      PT SHORT TERM GOAL #4   Title -    Baseline -             PT Long Term Goals - 05/27/20 1610      PT LONG TERM GOAL #1   Title be independent in advanced HEP    Status Achieved      PT LONG TERM GOAL #2   Title Pt will improve 5 times sit<>stand to <13 sec for decreased fall risk    Baseline 16 sec    Time 6    Period Weeks    Status Achieved       PT LONG TERM GOAL #3   Title Pt will be able to amb with normal reciprocal gait pattern without a/d for ~50'    Status Achieved      PT LONG TERM GOAL #4   Title Pt will demonstrate at least 4+/5 bilat LE strength bilat    Status Achieved      PT LONG TERM GOAL #5   Title Pt will have improved FOTO score to 31% impairment    Time 6    Period Weeks    Status Partially Met      PT LONG TERM GOAL #6   Title Pt will be able to amb x 8-10 min with rollator MI without rest break to assist with community ambulation    Baseline 5 minutes    Time 6    Period Weeks    Status Partially Met                 Plan - 05/27/20 1641    Clinical Impression Statement Pt is ready for d/c. Session limited due to pt with drop in glucose requiring rest break. Pt able to walk continuously for 5 minutes without rest. Long rest break required to get glucose back up. Pt has been meeting PT goals. At this point, pt has been prepped to continue to improve in community wellness programs including silver sneakers, chair yoga, and PT pilates.    Personal Factors and Comorbidities Age;Comorbidity 1;Comorbidity 2;Comorbidity 3+    Comorbidities osteoporosis, chronic R knee OA/pain, diabetes with neuropathy    Examination-Activity Limitations Caring for Others;Carry;Lift;Locomotion Level;Squat;Stairs;Stand    Examination-Participation Restrictions Cleaning;Community Activity    Stability/Clinical Decision Making Evolving/Moderate complexity    Rehab Potential Good    PT Frequency 2x / week    PT Duration 6 weeks    PT Treatment/Interventions ADLs/Self Care Home Management;Aquatic Therapy;Electrical Stimulation;Cryotherapy;Iontophoresis 4mg /ml Dexamethasone;Moist Heat;Ultrasound;Gait training;Stair training;Functional mobility training;Therapeutic activities;Therapeutic exercise;Balance training;Neuromuscular re-education;Patient/family education;Manual techniques;Dry needling;Energy  conservation;Taping;Vasopneumatic Device    PT Next Visit Plan Continue to progress endurance and  bilat LE strengthening and balance. Continue strengthening UEs for pt to feel less tired pushing rollator with ambulation. Prep patient for community wellness    PT Home Exercise Plan Access Code: [email or web address]. forwards/backwards walking from counter    Consulted and Agree with Plan of Care Patient           PHYSICAL THERAPY DISCHARGE SUMMARY  Visits from Start of Care: 21  Current functional level related to goals / functional outcomes: See above   Remaining deficits: See above   Education / Equipment: Discussed transitioning to community wellness programs  Plan: Patient agrees to discharge.  Patient goals were partially met. Patient is being discharged due to meeting the stated rehab goals.  ?????       Patient will benefit from skilled therapeutic intervention in order to improve the following deficits and impairments:  Difficulty walking, Cardiopulmonary status limiting activity, Decreased endurance, Decreased activity tolerance, Pain, Decreased balance, Decreased mobility, Decreased strength, Increased edema  Visit Diagnosis: Difficulty in walking, not elsewhere classified  Muscle weakness (generalized)  Chronic pain of right knee  Other abnormalities of gait and mobility     Problem List Patient Active Problem List   Diagnosis Date Noted  . Chronic respiratory failure with hypoxia ([place]) 07/03/2019  . Atypical atrial flutter ([place])   . Normocytic anemia 05/18/2017  . Chronic ITP (idiopathic thrombocytopenia) ([facility]) 01/25/2017  . Chronic kidney disease (CKD) stage G4/A1, severely decreased glomerular filtration rate (GFR) between 15-29 mL/min/1.73 square meter and albuminuria creatinine ratio less than 30 mg/g ([facility]) 09/04/2015  . GOUT 06/23/2010  . DERMATITIS 01/16/2010  . BACK PAIN 06/13/2009  . MYALGIA 06/13/2009  . Anxiety state 12/13/2008  . LEG EDEMA, CHRONIC  11/08/2008  . Sleep apnea 10/26/2008  . Hypothyroidism 09/27/2008  . HYPERCHOLESTEROLEMIA 09/27/2008  . OBESITY 09/27/2008  . DEPRESSION 09/27/2008  . Essential hypertension 09/27/2008  . ALLERGIC RHINITIS 09/27/2008  . ASTHMA, CHILDHOOD 09/27/2008    [facility] [address] PT, DPT 05/27/2020, 5:14 PM  [facility] [address] [place], [place], [address] Phone: [phone]   Fax:  [phone]  Name: [name] MRN: [id number] Date of Birth: 1935-07-03

## 2020-05-27 NOTE — Patient Instructions (Addendum)
Flu shot today.  Chest x-ray today Call back if you change your mind about CPAP  May use Oxygen 2l/m with activity and At bedtime  .  Goal is to keep oxygen level >90% .  Activity as tolerated.  Follow up with Dr. Elsworth Soho  In 6 months and As needed   Please contact office for sooner follow up if symptoms do not improve or worsen or seek emergency care

## 2020-05-27 NOTE — Assessment & Plan Note (Signed)
Recommend using oxygen to keep O2 saturations greater than 88 to 90%.  Plan  Patient Instructions  Flu shot today.  Chest x-ray today Call back if you change your mind about CPAP  May use Oxygen 2l/m with activity and At bedtime  .  Goal is to keep oxygen level >90% .  Activity as tolerated.  Follow up with Dr. Elsworth Soho  In 6 months and As needed   Please contact office for sooner follow up if symptoms do not improve or worsen or seek emergency care

## 2020-05-28 ENCOUNTER — Telehealth: Payer: Self-pay | Admitting: Adult Health

## 2020-05-28 NOTE — Progress Notes (Signed)
Patient returned call.  Provided cxr results per Rexene Edison NP.  She verbalized understanding.  Nothing further needed.

## 2020-05-28 NOTE — Telephone Encounter (Signed)
Patient called back returning call from yesterday.  Advised of CXR results per Rexene Edison NP.  She verbalized understanding.  Nothing further needed.

## 2020-06-10 ENCOUNTER — Telehealth: Payer: Self-pay | Admitting: Family

## 2020-06-10 NOTE — Telephone Encounter (Signed)
I called and spoke with patient about rescheduling her 10/8 appts due ot provider being out of the office.  She was ok with new date/time of 10/7

## 2020-06-11 ENCOUNTER — Other Ambulatory Visit: Payer: Self-pay | Admitting: Family Medicine

## 2020-06-13 ENCOUNTER — Ambulatory Visit: Payer: Medicare Other | Admitting: Family

## 2020-06-13 ENCOUNTER — Other Ambulatory Visit: Payer: Medicare Other

## 2020-06-13 ENCOUNTER — Ambulatory Visit: Payer: Medicare Other

## 2020-06-13 ENCOUNTER — Inpatient Hospital Stay (HOSPITAL_BASED_OUTPATIENT_CLINIC_OR_DEPARTMENT_OTHER): Payer: Medicare Other | Admitting: Family

## 2020-06-13 ENCOUNTER — Encounter: Payer: Self-pay | Admitting: Family

## 2020-06-13 ENCOUNTER — Inpatient Hospital Stay: Payer: Medicare Other | Attending: Hematology & Oncology

## 2020-06-13 ENCOUNTER — Other Ambulatory Visit: Payer: Self-pay

## 2020-06-13 VITALS — BP 125/51 | HR 73 | Temp 98.0°F | Resp 18 | Ht 61.0 in | Wt 218.0 lb

## 2020-06-13 DIAGNOSIS — D500 Iron deficiency anemia secondary to blood loss (chronic): Secondary | ICD-10-CM

## 2020-06-13 DIAGNOSIS — Z7901 Long term (current) use of anticoagulants: Secondary | ICD-10-CM | POA: Insufficient documentation

## 2020-06-13 DIAGNOSIS — I4891 Unspecified atrial fibrillation: Secondary | ICD-10-CM | POA: Diagnosis not present

## 2020-06-13 DIAGNOSIS — D693 Immune thrombocytopenic purpura: Secondary | ICD-10-CM

## 2020-06-13 DIAGNOSIS — I480 Paroxysmal atrial fibrillation: Secondary | ICD-10-CM

## 2020-06-13 DIAGNOSIS — Z86711 Personal history of pulmonary embolism: Secondary | ICD-10-CM | POA: Diagnosis not present

## 2020-06-13 DIAGNOSIS — D509 Iron deficiency anemia, unspecified: Secondary | ICD-10-CM | POA: Insufficient documentation

## 2020-06-13 LAB — CMP (CANCER CENTER ONLY)
ALT: 11 U/L (ref 0–44)
AST: 14 U/L — ABNORMAL LOW (ref 15–41)
Albumin: 4.4 g/dL (ref 3.5–5.0)
Alkaline Phosphatase: 67 U/L (ref 38–126)
Anion gap: 10 (ref 5–15)
BUN: 54 mg/dL — ABNORMAL HIGH (ref 8–23)
CO2: 32 mmol/L (ref 22–32)
Calcium: 10.3 mg/dL (ref 8.9–10.3)
Chloride: 98 mmol/L (ref 98–111)
Creatinine: 1.74 mg/dL — ABNORMAL HIGH (ref 0.44–1.00)
GFR, Estimated: 27 mL/min — ABNORMAL LOW (ref >60)
Glucose, Bld: 151 mg/dL — ABNORMAL HIGH (ref 70–99)
Potassium: 4.2 mmol/L (ref 3.5–5.1)
Sodium: 140 mmol/L (ref 135–145)
Total Bilirubin: 0.8 mg/dL (ref 0.3–1.2)
Total Protein: 7.5 g/dL (ref 6.5–8.1)

## 2020-06-13 LAB — CBC WITH DIFFERENTIAL (CANCER CENTER ONLY)
Abs Immature Granulocytes: 0.02 10*3/uL (ref 0.00–0.07)
Basophils Absolute: 0 10*3/uL (ref 0.0–0.1)
Basophils Relative: 0 %
Eosinophils Absolute: 0.2 10*3/uL (ref 0.0–0.5)
Eosinophils Relative: 3 %
HCT: 35.2 % — ABNORMAL LOW (ref 36.0–46.0)
Hemoglobin: 11.6 g/dL — ABNORMAL LOW (ref 12.0–15.0)
Immature Granulocytes: 0 %
Lymphocytes Relative: 22 %
Lymphs Abs: 1.2 10*3/uL (ref 0.7–4.0)
MCH: 31 pg (ref 26.0–34.0)
MCHC: 33 g/dL (ref 30.0–36.0)
MCV: 94.1 fL (ref 80.0–100.0)
Monocytes Absolute: 0.4 10*3/uL (ref 0.1–1.0)
Monocytes Relative: 8 %
Neutro Abs: 3.6 10*3/uL (ref 1.7–7.7)
Neutrophils Relative %: 67 %
Platelet Count: 122 10*3/uL — ABNORMAL LOW (ref 150–400)
RBC: 3.74 MIL/uL — ABNORMAL LOW (ref 3.87–5.11)
RDW: 15.3 % (ref 11.5–15.5)
WBC Count: 5.5 10*3/uL (ref 4.0–10.5)
nRBC: 0 % (ref 0.0–0.2)

## 2020-06-13 NOTE — Progress Notes (Signed)
Hematology and Oncology Follow Up Visit  [name] [id number] 13-Aug-1935 85 y.o. 06/13/2020   Principle Diagnosis:  Immune based thrombocytopenia History of PE Iron deficiency anemia Atrial fib  Current Therapy: Nplate q3week for platelet count < 100K IV Iron as indicated Eliquis 2.5 mg po BID   Interim History:  [name] is here today with [name] daughter for follow-up. The nurse chekcing [name] in had a hard time getting [name] pulse ox to read [name] HR. HR and rhythm on auscultation were irregular. EKG confirmed she is back in atrial fib. This appears to be controlled and she remains asymptomatic at this time.  She is not currently on an anticoagulant. I reached out to [name] cardiologist Dr. Derl Barrow about [name] EKG and sent him the report as well as medication management.  No fever, chills, n/v, cough, rash, dizziness, headache, blurred/changes in vision, SOB, chest pain, palpitations, abdominal pain or changes in bowel or bladder habits.  WBC count is 5.5, Hgb 11.6 and platelets are 122.  No episodes of bleeding. No abnormal bruising, no petechiae.  The swelling in [name] lower extremities is mild and stable.  She is currently in piliates 22 days a week and really enjoying it.  No falls or syncope.  She has maintained a good appetite and is staying well hydrated. [name] weight is stable.   ECOG Performance Status: 1 - Symptomatic but completely ambulatory  Medications:  Allergies as of 06/13/2020      Reactions   Adhesive [tape] Other (See Comments)   PULLS OFF THE SKIN   Apixaban Other (See Comments)   Internal Bleeding   Aspirin Itching, Rash, Hives, Swelling   Swelling of [name] tongue   Mirabegron Other (See Comments)   Patient experienced A-Fib   Pineapple Anaphylaxis, Swelling   Throat swells and blisters on tongue and roof of mouth per patient   Metformin Diarrhea, Nausea Only   Tetracycline Hives   Fluticasone-salmeterol Itching, Rash   Iodinated  Diagnostic Agents Itching, Rash      Lactose Intolerance (gi) Other (See Comments)   Gas   Latex Itching, Rash, Other (See Comments)   Pulls off the skin and causes welts   Lisinopril Cough   Metronidazole Other (See Comments)   Reaction not known   Sulfa Antibiotics Rash   Sulfonamide Derivatives Itching, Rash      Medication List       Accurate as of June 13, 2020  1:55 PM. If you have any questions, ask your nurse or doctor.        acetaminophen 325 MG tablet Commonly known as: TYLENOL Take 650 mg by mouth every 6 (six) hours as needed for moderate pain or fever (for fever or pain/discomfort).   atorvastatin 40 MG tablet Commonly known as: LIPITOR Take 40 mg by mouth at bedtime.   bisacodyl 5 MG EC tablet Commonly known as: DULCOLAX Take 10 mg by mouth every 3 (three) days as needed (for constipation).   feeding supplement (PRO-STAT SUGAR FREE 64) Liqd Take 30 mLs by mouth 2 (two) times daily.   fenofibrate 54 MG tablet TAKE 1 TABLET(54 MG) BY MOUTH DAILY   fexofenadine 180 MG tablet Commonly known as: ALLEGRA Take 180 mg by mouth daily.   FreeStyle Libre 14 Day Sensor Misc Apply new sensor every 14 days.   furosemide 40 MG tablet Commonly known as: LASIX TAKE 1 TABLET BY MOUTH THREE TIMES WEEKLY, ALTERNATING WITH 20MG  FOUR TIMES WEEKLY   furosemide 20 MG tablet  Commonly known as: LASIX TAKE 1 TABLET BY MOUTH FOUR TIMES WEEKLY, ALTERNATING WITH 40MG  THREE TIMES WEEKLY   hyoscyamine 0.125 MG tablet Commonly known as: LEVSIN Take 1 tablet by mouth in the morning and at bedtime.   insulin lispro 100 UNIT/ML KwikPen Commonly known as: HumaLOG KwikPen Inject 0.13-0.2 mLs (13-20 Units total) into the skin with breakfast, with lunch, and with evening meal.   Insulin Pen Needle 32G X 4 MM Misc Use 4x a day   INSULIN SYRINGE .3CC/29GX1/2" 29G X 1/2" 0.3 ML Misc Use to inject insulin 4 times a day.   Lantus SoloStar 100 UNIT/ML Solostar Pen Generic drug:  insulin glargine Inject 50 Units into the skin daily. What changed: how much to take   levothyroxine 112 MCG tablet Commonly known as: SYNTHROID TAKE 1 TABLET BY MOUTH EVERY DAY   multivitamin tablet Take 1 tablet by mouth daily.   potassium citrate 10 MEQ (1080 MG) SR tablet Commonly known as: UROCIT-K Take 20 mEq by mouth in the morning and at bedtime.   sertraline 50 MG tablet Commonly known as: ZOLOFT TAKE 1 TABLET BY MOUTH AT BEDTIME   vitamin C 250 MG tablet Commonly known as: ASCORBIC ACID Take 250 mg by mouth daily.   Vitamin D3 50 MCG (2000 UT) Tabs Take 2,000 Units by mouth daily.       Allergies:  Allergies  Allergen Reactions   Adhesive [Tape] Other (See Comments)    PULLS OFF THE SKIN   Apixaban Other (See Comments)    Internal Bleeding   Aspirin Itching, Rash, Hives and Swelling    Swelling of [name] tongue   Mirabegron Other (See Comments)    Patient experienced A-Fib   Pineapple Anaphylaxis and Swelling    Throat swells and blisters on tongue and roof of mouth per patient   Metformin Diarrhea and Nausea Only   Tetracycline Hives   Fluticasone-Salmeterol Itching and Rash   Iodinated Diagnostic Agents Itching and Rash        Lactose Intolerance (Gi) Other (See Comments)    Gas    Latex Itching, Rash and Other (See Comments)    Pulls off the skin and causes welts   Lisinopril Cough   Metronidazole Other (See Comments)    Reaction not known   Sulfa Antibiotics Rash   Sulfonamide Derivatives Itching and Rash    Past Medical History, Surgical history, Social history, and Family History were reviewed and updated.  Review of Systems: All other 10 point review of systems is negative.   Physical Exam:  height is 5\' 1"  (1.549 m) and weight is 218 lb (98.9 kg). [name] oral temperature is 98 F (36.7 C). [name] blood pressure is 125/51 (abnormal) and [name] pulse is 73. [name] respiration is 18 and oxygen saturation is 99%.   Wt Readings from  Last 3 Encounters:  06/13/20 218 lb (98.9 kg)  05/27/20 218 lb 3.2 oz (99 kg)  05/17/20 217 lb (98.4 kg)    Ocular: Sclerae unicteric, pupils equal, round and reactive to light Ear-nose-throat: Oropharynx clear, dentition fair Lymphatic: No cervical or supraclavicular adenopathy Lungs no rales or rhonchi, good excursion bilaterally Heart irregular rate and rhythm, no murmur appreciated Abd soft, nontender, positive bowel sounds MSK no focal spinal tenderness, no joint edema Neuro: non-focal, well-oriented, appropriate affect Breasts: Deferred   Lab Results  Component Value Date   WBC 5.5 06/13/2020   HGB 11.6 (L) 06/13/2020   HCT 35.2 (L) 06/13/2020   MCV 94.1 06/13/2020  PLT 122 (L) 06/13/2020   Lab Results  Component Value Date   FERRITIN 341 (H) 05/03/2020   IRON 75 05/03/2020   TIBC 295 05/03/2020   UIBC 220 05/03/2020   IRONPCTSAT 25 05/03/2020   Lab Results  Component Value Date   RETICCTPCT 2.8 07/05/2019   RBC 3.74 (L) 06/13/2020   RETICCTABS 73.3 01/28/2011   No results found for: KPAFRELGTCHN, LAMBDASER, KAPLAMBRATIO No results found for: IGGSERUM, IGA, IGMSERUM No results found for: Odetta Pink, SPEI   Chemistry      Component Value Date/Time   NA 140 06/13/2020 1312   NA 145 09/08/2017 1317   NA 142 02/25/2017 1013   K 4.2 06/13/2020 1312   K 4.5 09/08/2017 1317   K 3.8 02/25/2017 1013   CL 98 06/13/2020 1312   CL 98 09/08/2017 1317   CO2 32 06/13/2020 1312   CO2 32 09/08/2017 1317   CO2 26 02/25/2017 1013   BUN 54 (H) 06/13/2020 1312   BUN 34 (H) 09/08/2017 1317   BUN 20.1 02/25/2017 1013   CREATININE 1.74 (H) 06/13/2020 1312   CREATININE 1.9 (H) 09/08/2017 1317   CREATININE 1.8 (H) 02/25/2017 1013      Component Value Date/Time   CALCIUM 10.3 06/13/2020 1312   CALCIUM 9.3 09/08/2017 1317   CALCIUM 8.7 02/25/2017 1013   ALKPHOS 67 06/13/2020 1312   ALKPHOS 45 09/08/2017 1317    ALKPHOS 65 02/25/2017 1013   AST 14 (L) 06/13/2020 1312   AST 16 02/25/2017 1013   ALT 11 06/13/2020 1312   ALT 26 09/08/2017 1317   ALT 13 02/25/2017 1013   BILITOT 0.8 06/13/2020 1312   BILITOT 0.76 02/25/2017 1013       Impression and Plan: [name] is a very pleasant 85yo caucasian female with immune based thrombocytopenia. No Nplate needed today.  EKG report and note routed to Dr. Marlou Porch for further eval and treatment is needed.  We will see what [name] iron studies look like and replace if needed.  Follow-up in 6 weeks.  She can contact our office with any questions or concerns. We can certainly see [name] sooner if needed.   Laverna Peace, NP 10/7/20211:55 PM

## 2020-06-14 ENCOUNTER — Inpatient Hospital Stay: Payer: Medicare Other | Admitting: Family

## 2020-06-14 ENCOUNTER — Inpatient Hospital Stay: Payer: Medicare Other

## 2020-06-14 LAB — FERRITIN: Ferritin: 314 ng/mL — ABNORMAL HIGH (ref 11–307)

## 2020-06-14 LAB — IRON AND TIBC
Iron: 76 ug/dL (ref 41–142)
Saturation Ratios: 25 % (ref 21–57)
TIBC: 299 ug/dL (ref 236–444)
UIBC: 223 ug/dL (ref 120–384)

## 2020-07-01 ENCOUNTER — Telehealth: Payer: Self-pay | Admitting: *Deleted

## 2020-07-01 NOTE — Telephone Encounter (Signed)
Dr Ethlyn Gallery completed the order form.  The order form and office notes from 04/05/2020 was faxed to [place] at [phone] and sent to be scanned.

## 2020-07-01 NOTE — Telephone Encounter (Signed)
Ashlyne from [facility] called wanting to know if we received the order for diabetic shoes. Please call Collie Siad back at [phone]

## 2020-07-08 ENCOUNTER — Encounter: Payer: Self-pay | Admitting: Family Medicine

## 2020-07-08 ENCOUNTER — Ambulatory Visit (INDEPENDENT_AMBULATORY_CARE_PROVIDER_SITE_OTHER): Payer: Medicare Other | Admitting: Family Medicine

## 2020-07-08 ENCOUNTER — Other Ambulatory Visit: Payer: Self-pay

## 2020-07-08 VITALS — BP 134/60 | HR 61 | Temp 97.8°F | Ht 61.0 in | Wt 223.2 lb

## 2020-07-08 DIAGNOSIS — F411 Generalized anxiety disorder: Secondary | ICD-10-CM

## 2020-07-08 DIAGNOSIS — J309 Allergic rhinitis, unspecified: Secondary | ICD-10-CM | POA: Diagnosis not present

## 2020-07-08 DIAGNOSIS — D693 Immune thrombocytopenic purpura: Secondary | ICD-10-CM

## 2020-07-08 DIAGNOSIS — I10 Essential (primary) hypertension: Secondary | ICD-10-CM

## 2020-07-08 DIAGNOSIS — E039 Hypothyroidism, unspecified: Secondary | ICD-10-CM | POA: Diagnosis not present

## 2020-07-08 DIAGNOSIS — E7800 Pure hypercholesterolemia, unspecified: Secondary | ICD-10-CM

## 2020-07-08 DIAGNOSIS — N184 Chronic kidney disease, stage 4 (severe): Secondary | ICD-10-CM

## 2020-07-08 DIAGNOSIS — M109 Gout, unspecified: Secondary | ICD-10-CM

## 2020-07-08 DIAGNOSIS — M25521 Pain in right elbow: Secondary | ICD-10-CM

## 2020-07-08 NOTE — Progress Notes (Signed)
[name] DOB: 07-20-1935 Encounter date: 07/08/2020  This is a 85 y.o. female who presents with Chief Complaint  Patient presents with  . Follow-up    History of present illness: Getting ready to follow back up with kidney specialist and she is doing her 24 hr urine collection.  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  DM2: follows with Der. Gherghe - last visit 05/17/20. Lantus 46 units, humalog 13-17 units TID Hypothyroid: managed by Dr. Renne Crigler - levothyroxine 113mcg daily HL: fenofibrate 54mg  daily, lipitor 40mg  daily Paroxysmal a fib: managed by cardiology [id number] determined not to be candidate for anticoag or ablation. Allergies: allegra 180mg  daily   States there is something wrong with her arm. Hurts from elbow - stabbing pain that radiates down to wrist, thumb. Even has pain that radiates up into chest on right side. If she puts arm down nothing hurts. Has bothered her for a long time, even before hospitalization. Thinks that reaching behind her to grab things off table may have affected this.   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   Allergies  Allergen Reactions  . Adhesive [Tape] Other (See Comments)    PULLS OFF THE SKIN  . Apixaban Other (See Comments)    Internal Bleeding  . Aspirin Itching, Rash, Hives and Swelling    Swelling of her tongue  . Mirabegron Other (See Comments)    Patient experienced A-Fib  . Pineapple Anaphylaxis and Swelling    Throat swells and blisters on tongue and roof of mouth per patient  . Metformin Diarrhea and Nausea Only  . Tetracycline Hives  . Fluticasone-Salmeterol Itching and Rash  . Iodinated Diagnostic Agents Itching and Rash       . Lactose Intolerance (Gi) Other (See Comments)    Gas   . Latex Itching, Rash and Other (See Comments)    Pulls off the skin and causes welts  . Lisinopril Cough  . Metronidazole Other (See Comments)    Reaction not known  . Sulfa Antibiotics  Rash  . Sulfonamide Derivatives Itching and Rash   Current Meds  Medication Sig  . acetaminophen (TYLENOL) 325 MG tablet Take 650 mg by mouth every 6 (six) hours as needed for moderate pain or fever (for fever or pain/discomfort).   . Amino Acids-Protein Hydrolys (FEEDING SUPPLEMENT, PRO-STAT SUGAR FREE 64,) LIQD Take 30 mLs by mouth 2 (two) times daily.  Marland Kitchen atorvastatin (LIPITOR) 40 MG tablet Take 40 mg by mouth at bedtime.   . bisacodyl (DULCOLAX) 5 MG EC tablet Take 10 mg by mouth every 3 (three) days as needed (for constipation).   . Cholecalciferol (VITAMIN D3) 2000 units TABS Take 2,000 Units by mouth daily.  . Continuous Blood Gluc Sensor (FREESTYLE LIBRE 14 DAY SENSOR) MISC Apply new sensor every 14 days.  . fenofibrate 54 MG tablet TAKE 1 TABLET(54 MG) BY MOUTH DAILY  . fexofenadine (ALLEGRA) 180 MG tablet Take 180 mg by mouth daily.  . furosemide (LASIX) 20 MG tablet TAKE 1 TABLET BY MOUTH FOUR TIMES WEEKLY, ALTERNATING WITH 40MG  THREE TIMES WEEKLY  . furosemide (LASIX) 40 MG tablet TAKE 1 TABLET BY MOUTH THREE TIMES WEEKLY, ALTERNATING WITH 20MG  FOUR TIMES WEEKLY  . hyoscyamine (LEVSIN) 0.125 MG tablet Take 1 tablet by mouth in the morning and at bedtime.   . insulin glargine (LANTUS SOLOSTAR) 100 UNIT/ML Solostar Pen Inject 50 Units into the skin daily. (Patient taking differently: Inject 46 Units into the skin daily. )  . insulin  lispro (HUMALOG KWIKPEN) 100 UNIT/ML KwikPen Inject 0.13-0.2 mLs (13-20 Units total) into the skin with breakfast, with lunch, and with evening meal.  . Insulin Pen Needle 32G X 4 MM MISC Use 4x a day  . Insulin Syringe-Needle U-100 (INSULIN SYRINGE .3CC/29GX1/2") 29G X 1/2" 0.3 ML MISC Use to inject insulin 4 times a day.  . levothyroxine (SYNTHROID) 112 MCG tablet TAKE 1 TABLET BY MOUTH EVERY DAY  . Multiple Vitamin (MULTIVITAMIN) tablet Take 1 tablet by mouth daily.    . potassium citrate (UROCIT-K) 10 MEQ (1080 MG) SR tablet Take 10 mEq by mouth in the  morning and at bedtime.   . sertraline (ZOLOFT) 50 MG tablet TAKE 1 TABLET BY MOUTH AT BEDTIME  . vitamin C (ASCORBIC ACID) 250 MG tablet Take 250 mg by mouth daily.    Review of Systems  Constitutional: Negative for chills, fatigue and fever.  Respiratory: Negative for cough, chest tightness, shortness of breath and wheezing.   Cardiovascular: Positive for leg swelling (stable from baseline). Negative for chest pain and palpitations.  Musculoskeletal: Positive for arthralgias and gait problem (getting stronger; walking around more. working with PT).    Objective:  Pulse 61   Temp 97.8 F (36.6 C) (Oral)   Ht 5\' 1"  (1.549 m)   Wt 223 lb 3.2 oz (101.2 kg)   LMP  (LMP Unknown)   SpO2 98%   BMI 42.17 kg/m   Weight: 223 lb 3.2 oz (101.2 kg)   BP Readings from Last 3 Encounters:  06/13/20 (!) 125/51  05/27/20 (!) 148/76  05/17/20 122/70   Wt Readings from Last 3 Encounters:  07/08/20 223 lb 3.2 oz (101.2 kg)  06/13/20 218 lb (98.9 kg)  05/27/20 218 lb 3.2 oz (99 kg)    Physical Exam Constitutional:      General: She is not in acute distress.    Appearance: She is well-developed.  Cardiovascular:     Rate and Rhythm: Normal rate and regular rhythm. Occasional extrasystoles are present.    Heart sounds: Normal heart sounds. No murmur heard.  No friction rub.  Pulmonary:     Effort: Pulmonary effort is normal. No respiratory distress.     Breath sounds: Normal breath sounds. No wheezing or rales.  Musculoskeletal:     Right lower leg: 2+ Pitting Edema present.     Left lower leg: 2+ Pitting Edema present.     Comments: She has full range of motion of shoulders bilaterally. She does have some slight discomfort with palpation over the tricep insertion, as well as medial upper condyle. She has mild discomfort with supination and pronation she does have some slight weakness 4+ out of 5 with triceps strength on the right. She has slight discomfort with bicep testing, but no  weakness on the right. Rotator cuff strength appears intact.  Neurological:     Mental Status: She is alert and oriented to person, place, and time.  Psychiatric:        Behavior: Behavior normal.     Assessment/Plan  1. Essential hypertension Blood pressures on the lower end of normal. I encouraged her to check regularly at home. She is not currently on any blood pressure medications.  2. Allergic rhinitis, unspecified seasonality, unspecified trigger Continue with Allegra. Symptoms are stable.  3. Hypothyroidism, unspecified type She does follow regularly with endocrinology. Thyroids been well controlled. Continue levothyroxine 112 mcg daily.  4. Chronic ITP (idiopathic thrombocytopenia) (HCC) Blood counts have been stable. She is following regularly  with hematology. I have printed out lab orders for her today to take with her in hopes that they can draw these at her next visit with them.  5. Chronic kidney disease (CKD) stage G4/A1, severely decreased glomerular filtration rate (GFR) between 15-29 mL/min/1.73 square meter and albuminuria creatinine ratio less than 30 mg/g (HCC) Kidney function has been stable. She does have follow-up with nephrology and I have asked her to remind them to send me a note after her visit.  6. HYPERCHOLESTEROLEMIA We will plan to recheck cholesterol next set of blood work. This order was printed for her today. For now, continue Lipitor 40 mg, fenofibrate 54 mg daily.  7. Gout, unspecified cause, unspecified chronicity, unspecified site Has been well controlled.  8. Anxiety state Continue Zoloft 50 mg daily. Mood has been good.  9. Right elbow pain I would like Ortho to evaluate her since she appears to have a medial epicondyle tenderness but also having some tenderness along tricep with radiation up into the shoulder and chest. - Ambulatory referral to Orthopedics   Return in about 4 months (around 11/05/2020) for Chronic condition visit.  45  minutes spent in time with patient, chart review, charting time, exam.  Micheline Rough, MD

## 2020-07-15 DIAGNOSIS — M25521 Pain in right elbow: Secondary | ICD-10-CM | POA: Insufficient documentation

## 2020-07-25 ENCOUNTER — Other Ambulatory Visit: Payer: Self-pay

## 2020-07-25 ENCOUNTER — Inpatient Hospital Stay (HOSPITAL_BASED_OUTPATIENT_CLINIC_OR_DEPARTMENT_OTHER): Payer: Medicare Other | Admitting: Family

## 2020-07-25 ENCOUNTER — Inpatient Hospital Stay: Payer: Medicare Other | Attending: Hematology & Oncology

## 2020-07-25 ENCOUNTER — Other Ambulatory Visit: Payer: Self-pay | Admitting: Family Medicine

## 2020-07-25 ENCOUNTER — Inpatient Hospital Stay: Payer: Medicare Other

## 2020-07-25 ENCOUNTER — Telehealth: Payer: Self-pay

## 2020-07-25 VITALS — BP 169/64 | HR 103 | Temp 97.0°F | Resp 18 | Ht 61.0 in | Wt 216.5 lb

## 2020-07-25 DIAGNOSIS — Z881 Allergy status to other antibiotic agents status: Secondary | ICD-10-CM | POA: Diagnosis not present

## 2020-07-25 DIAGNOSIS — Z888 Allergy status to other drugs, medicaments and biological substances status: Secondary | ICD-10-CM | POA: Insufficient documentation

## 2020-07-25 DIAGNOSIS — Z886 Allergy status to analgesic agent status: Secondary | ICD-10-CM | POA: Insufficient documentation

## 2020-07-25 DIAGNOSIS — Z79899 Other long term (current) drug therapy: Secondary | ICD-10-CM | POA: Diagnosis not present

## 2020-07-25 DIAGNOSIS — I480 Paroxysmal atrial fibrillation: Secondary | ICD-10-CM

## 2020-07-25 DIAGNOSIS — Z882 Allergy status to sulfonamides status: Secondary | ICD-10-CM | POA: Insufficient documentation

## 2020-07-25 DIAGNOSIS — M7989 Other specified soft tissue disorders: Secondary | ICD-10-CM | POA: Insufficient documentation

## 2020-07-25 DIAGNOSIS — D693 Immune thrombocytopenic purpura: Secondary | ICD-10-CM

## 2020-07-25 DIAGNOSIS — G629 Polyneuropathy, unspecified: Secondary | ICD-10-CM | POA: Diagnosis not present

## 2020-07-25 DIAGNOSIS — D649 Anemia, unspecified: Secondary | ICD-10-CM | POA: Diagnosis not present

## 2020-07-25 DIAGNOSIS — I4891 Unspecified atrial fibrillation: Secondary | ICD-10-CM | POA: Diagnosis not present

## 2020-07-25 DIAGNOSIS — D509 Iron deficiency anemia, unspecified: Secondary | ICD-10-CM | POA: Diagnosis not present

## 2020-07-25 DIAGNOSIS — Z86711 Personal history of pulmonary embolism: Secondary | ICD-10-CM | POA: Insufficient documentation

## 2020-07-25 DIAGNOSIS — D500 Iron deficiency anemia secondary to blood loss (chronic): Secondary | ICD-10-CM

## 2020-07-25 LAB — CMP (CANCER CENTER ONLY)
ALT: 11 U/L (ref 0–44)
AST: 12 U/L — ABNORMAL LOW (ref 15–41)
Albumin: 4.2 g/dL (ref 3.5–5.0)
Alkaline Phosphatase: 66 U/L (ref 38–126)
Anion gap: 10 (ref 5–15)
BUN: 56 mg/dL — ABNORMAL HIGH (ref 8–23)
CO2: 29 mmol/L (ref 22–32)
Calcium: 10.3 mg/dL (ref 8.9–10.3)
Chloride: 101 mmol/L (ref 98–111)
Creatinine: 1.65 mg/dL — ABNORMAL HIGH (ref 0.44–1.00)
GFR, Estimated: 31 mL/min — ABNORMAL LOW (ref >60)
Glucose, Bld: 158 mg/dL — ABNORMAL HIGH (ref 70–99)
Potassium: 4.3 mmol/L (ref 3.5–5.1)
Sodium: 140 mmol/L (ref 135–145)
Total Bilirubin: 0.7 mg/dL (ref 0.3–1.2)
Total Protein: 7.5 g/dL (ref 6.5–8.1)

## 2020-07-25 LAB — CBC WITH DIFFERENTIAL (CANCER CENTER ONLY)
Abs Immature Granulocytes: 0.02 10*3/uL (ref 0.00–0.07)
Basophils Absolute: 0 10*3/uL (ref 0.0–0.1)
Basophils Relative: 0 %
Eosinophils Absolute: 0.1 10*3/uL (ref 0.0–0.5)
Eosinophils Relative: 3 %
HCT: 34 % — ABNORMAL LOW (ref 36.0–46.0)
Hemoglobin: 11.4 g/dL — ABNORMAL LOW (ref 12.0–15.0)
Immature Granulocytes: 0 %
Lymphocytes Relative: 18 %
Lymphs Abs: 0.9 10*3/uL (ref 0.7–4.0)
MCH: 30.9 pg (ref 26.0–34.0)
MCHC: 33.5 g/dL (ref 30.0–36.0)
MCV: 92.1 fL (ref 80.0–100.0)
Monocytes Absolute: 0.3 10*3/uL (ref 0.1–1.0)
Monocytes Relative: 6 %
Neutro Abs: 3.5 10*3/uL (ref 1.7–7.7)
Neutrophils Relative %: 73 %
Platelet Count: 105 10*3/uL — ABNORMAL LOW (ref 150–400)
RBC: 3.69 MIL/uL — ABNORMAL LOW (ref 3.87–5.11)
RDW: 15.6 % — ABNORMAL HIGH (ref 11.5–15.5)
WBC Count: 4.8 10*3/uL (ref 4.0–10.5)
nRBC: 0 % (ref 0.0–0.2)

## 2020-07-25 NOTE — Progress Notes (Signed)
Hematology and Oncology Follow Up Visit  [name] [id number] 1935-07-27 85 y.o. 07/25/2020   Principle Diagnosis:  Immune based thrombocytopenia History of PE Iron deficiency anemia Atrial fib  Current Therapy: Nplate q3week for platelet count < 100K IV Iron as indicated   Interim History:  [name] is here today with her daughter for follow-up. She is doing well and has no complaints at this time.  She is exercising regularly and doing piliates twice a week with a trainer.  She also does PT/OT.  No fever, chills, n/v, cough, rash, dizziness, SOB, chest pain, palpitations, abdominal pain or changes in bowel or bladder habits.  No blood loss noted. No bruising or petechiae.  Chronic swelling in her lower extremities has improved. Pedal pulses are 2+.  Neuropathy in her feet is stable.  No falls or syncopal episodes to report.  She uses her Rolator when ambulating for added support.   She has maintained a good healthy appetite and is hydrating properly. Her weight is stable. She weighs regularly at home.   ECOG Performance Status: 1 - Symptomatic but completely ambulatory  Medications:  Allergies as of 07/25/2020      Reactions   Adhesive [tape] Other (See Comments)   PULLS OFF THE SKIN   Apixaban Other (See Comments)   Internal Bleeding   Aspirin Itching, Rash, Hives, Swelling   Swelling of her tongue   Mirabegron Other (See Comments)   Patient experienced A-Fib   Pineapple Anaphylaxis, Swelling   Throat swells and blisters on tongue and roof of mouth per patient   Metformin Diarrhea, Nausea Only   Tetracycline Hives   Fluticasone-salmeterol Itching, Rash   Iodinated Diagnostic Agents Itching, Rash      Lactose Intolerance (gi) Other (See Comments)   Gas   Latex Itching, Rash, Other (See Comments)   Pulls off the skin and causes welts   Lisinopril Cough   Metronidazole Other (See Comments)   Reaction not known   Sulfa Antibiotics Rash    Sulfonamide Derivatives Itching, Rash      Medication List       Accurate as of July 25, 2020  1:18 PM. If you have any questions, ask your nurse or doctor.        acetaminophen 325 MG tablet Commonly known as: TYLENOL Take 650 mg by mouth every 6 (six) hours as needed for moderate pain or fever (for fever or pain/discomfort).   atorvastatin 40 MG tablet Commonly known as: LIPITOR Take 40 mg by mouth at bedtime.   bisacodyl 5 MG EC tablet Commonly known as: DULCOLAX Take 10 mg by mouth every 3 (three) days as needed (for constipation).   feeding supplement (PRO-STAT SUGAR FREE 64) Liqd Take 30 mLs by mouth 2 (two) times daily.   fenofibrate 54 MG tablet TAKE 1 TABLET(54 MG) BY MOUTH DAILY   fexofenadine 180 MG tablet Commonly known as: ALLEGRA Take 180 mg by mouth daily.   FreeStyle Libre 14 Day Sensor Misc Apply new sensor every 14 days.   furosemide 40 MG tablet Commonly known as: LASIX TAKE 1 TABLET BY MOUTH THREE TIMES WEEKLY, ALTERNATING WITH 20MG  FOUR TIMES WEEKLY   furosemide 20 MG tablet Commonly known as: LASIX TAKE 1 TABLET BY MOUTH FOUR TIMES WEEKLY, ALTERNATING WITH 40MG  THREE TIMES WEEKLY   hyoscyamine 0.125 MG tablet Commonly known as: LEVSIN Take 1 tablet by mouth in the morning and at bedtime.   insulin lispro 100 UNIT/ML KwikPen Commonly known as: HumaLOG KwikPen  Inject 0.13-0.2 mLs (13-20 Units total) into the skin with breakfast, with lunch, and with evening meal.   Insulin Pen Needle 32G X 4 MM Misc Use 4x a day   INSULIN SYRINGE .3CC/29GX1/2" 29G X 1/2" 0.3 ML Misc Use to inject insulin 4 times a day.   Lantus SoloStar 100 UNIT/ML Solostar Pen Generic drug: insulin glargine Inject 50 Units into the skin daily. What changed: how much to take   levothyroxine 112 MCG tablet Commonly known as: SYNTHROID TAKE 1 TABLET BY MOUTH EVERY DAY   multivitamin tablet Take 1 tablet by mouth daily.   potassium citrate 10 MEQ (1080 MG)  SR tablet Commonly known as: UROCIT-K Take 10 mEq by mouth in the morning and at bedtime.   sertraline 50 MG tablet Commonly known as: ZOLOFT TAKE 1 TABLET BY MOUTH AT BEDTIME   vitamin C 250 MG tablet Commonly known as: ASCORBIC ACID Take 250 mg by mouth daily.   Vitamin D3 50 MCG (2000 UT) Tabs Take 2,000 Units by mouth daily.       Allergies:  Allergies  Allergen Reactions   Adhesive [Tape] Other (See Comments)    PULLS OFF THE SKIN   Apixaban Other (See Comments)    Internal Bleeding   Aspirin Itching, Rash, Hives and Swelling    Swelling of her tongue   Mirabegron Other (See Comments)    Patient experienced A-Fib   Pineapple Anaphylaxis and Swelling    Throat swells and blisters on tongue and roof of mouth per patient   Metformin Diarrhea and Nausea Only   Tetracycline Hives   Fluticasone-Salmeterol Itching and Rash   Iodinated Diagnostic Agents Itching and Rash        Lactose Intolerance (Gi) Other (See Comments)    Gas    Latex Itching, Rash and Other (See Comments)    Pulls off the skin and causes welts   Lisinopril Cough   Metronidazole Other (See Comments)    Reaction not known   Sulfa Antibiotics Rash   Sulfonamide Derivatives Itching and Rash    Past Medical History, Surgical history, Social history, and Family History were reviewed and updated.  Review of Systems: All other 10 point review of systems is negative.   Physical Exam:  vitals were not taken for this visit.   Wt Readings from Last 3 Encounters:  07/08/20 223 lb 3.2 oz (101.2 kg)  06/13/20 218 lb (98.9 kg)  05/27/20 218 lb 3.2 oz (99 kg)    Ocular: Sclerae unicteric, pupils equal, round and reactive to light Ear-nose-throat: Oropharynx clear, dentition fair Lymphatic: No cervical or supraclavicular adenopathy Lungs no rales or rhonchi, good excursion bilaterally Heart irregular rate and rhythm consistent with controlled a fib, no murmur appreciated Abd soft,  nontender, positive bowel sounds MSK no focal spinal tenderness, no joint edema Neuro: non-focal, well-oriented, appropriate affect Breasts: Deferred   Lab Results  Component Value Date   WBC 5.5 06/13/2020   HGB 11.6 (L) 06/13/2020   HCT 35.2 (L) 06/13/2020   MCV 94.1 06/13/2020   PLT 122 (L) 06/13/2020   Lab Results  Component Value Date   FERRITIN 314 (H) 06/13/2020   IRON 76 06/13/2020   TIBC 299 06/13/2020   UIBC 223 06/13/2020   IRONPCTSAT 25 06/13/2020   Lab Results  Component Value Date   RETICCTPCT 2.8 07/05/2019   RBC 3.74 (L) 06/13/2020   RETICCTABS 73.3 01/28/2011   No results found for: KPAFRELGTCHN, LAMBDASER, KAPLAMBRATIO No results found for: IGGSERUM,  IGA, IGMSERUM No results found for: Odetta Pink, SPEI   Chemistry      Component Value Date/Time   NA 140 06/13/2020 1312   NA 145 09/08/2017 1317   NA 142 02/25/2017 1013   K 4.2 06/13/2020 1312   K 4.5 09/08/2017 1317   K 3.8 02/25/2017 1013   CL 98 06/13/2020 1312   CL 98 09/08/2017 1317   CO2 32 06/13/2020 1312   CO2 32 09/08/2017 1317   CO2 26 02/25/2017 1013   BUN 54 (H) 06/13/2020 1312   BUN 34 (H) 09/08/2017 1317   BUN 20.1 02/25/2017 1013   CREATININE 1.74 (H) 06/13/2020 1312   CREATININE 1.9 (H) 09/08/2017 1317   CREATININE 1.8 (H) 02/25/2017 1013      Component Value Date/Time   CALCIUM 10.3 06/13/2020 1312   CALCIUM 9.3 09/08/2017 1317   CALCIUM 8.7 02/25/2017 1013   ALKPHOS 67 06/13/2020 1312   ALKPHOS 45 09/08/2017 1317   ALKPHOS 65 02/25/2017 1013   AST 14 (L) 06/13/2020 1312   AST 16 02/25/2017 1013   ALT 11 06/13/2020 1312   ALT 26 09/08/2017 1317   ALT 13 02/25/2017 1013   BILITOT 0.8 06/13/2020 1312   BILITOT 0.76 02/25/2017 1013       Impression and Plan: Ms. Minium is a very pleasant 85yo caucasian female with immune based thrombocytopenia. No Nplate needed this visit.  Iron studies are pending. We will  replace if needed.  I spoke with her daughter and they plan to contact Dr. Marlou Porch regarding her atrial fib.  Follow-up in 6 weeks.  They were encouraged to contact our office with any questions or concerns.   Laverna Peace, NP 11/18/20211:18 PM

## 2020-07-25 NOTE — Telephone Encounter (Signed)
appts made per 07/25/20 and req not to have it printed- will view on mychart...  AOM

## 2020-07-26 LAB — LIPID PANEL W/O CHOL/HDL RATIO
Cholesterol, Total: 168 mg/dL (ref 100–199)
HDL: 29 mg/dL — ABNORMAL LOW (ref >39)
LDL Chol Calc (NIH): 53 mg/dL (ref 0–99)
Triglycerides: 586 mg/dL (ref 0–149)
VLDL Cholesterol Cal: 86 mg/dL — ABNORMAL HIGH (ref 5–40)

## 2020-07-26 LAB — HGB A1C W/O EAG: Hgb A1c MFr Bld: 6.8 % — ABNORMAL HIGH (ref 4.8–5.6)

## 2020-07-26 LAB — TSH: TSH: 0.544 u[IU]/mL (ref 0.450–4.500)

## 2020-07-26 LAB — IRON AND TIBC
Iron: 74 ug/dL (ref 41–142)
Saturation Ratios: 25 % (ref 21–57)
TIBC: 301 ug/dL (ref 236–444)
UIBC: 227 ug/dL (ref 120–384)

## 2020-07-26 LAB — VITAMIN D 25 HYDROXY (VIT D DEFICIENCY, FRACTURES): Vit D, 25-Hydroxy: 29.5 ng/mL — ABNORMAL LOW (ref 30.0–100.0)

## 2020-07-26 LAB — B12 AND FOLATE PANEL
Folate: >20 ng/mL (ref >3.0)
Vitamin B-12: 305 pg/mL (ref 232–1245)

## 2020-07-26 LAB — FERRITIN: Ferritin: 287 ng/mL (ref 11–307)

## 2020-07-29 ENCOUNTER — Telehealth: Payer: Self-pay | Admitting: Cardiology

## 2020-07-29 NOTE — Telephone Encounter (Signed)
Patient c/o Palpitations:  High priority if patient c/o lightheadedness, shortness of breath, or chest pain  1) How long have you had palpitations/irregular HR/ Afib? Are you having the symptoms now? yes, for the last 6 weeks  2) Are you currently experiencing lightheadedness, SOB or CP? No, had strange chest pain last night for a minute  3) Do you have a history of afib (atrial fibrillation) or irregular heart rhythm? yes  4) Have you checked your BP or HR? (document readings if available): 178/79  5) Are you experiencing any other symptoms? no   Patient states the patient has been in afib for 6 weeks. She is scheduled tomorrow with Cecille Rubin.

## 2020-07-29 NOTE — Telephone Encounter (Signed)
Spoke with daughter [name] who is reporting pt has been in At Fib for probably 3 months now.  Pt denies and s/s - no SOB, cp, dizziness etc.  Daughter states she is more concerned about her BP being elevated and also would like to know if pt would benefit from another cardioversion.  Advised once pt is seen in the AM, Cecille Rubin will determine the best course of action for her.  Daughter appreciative of the call back on information.  She will be here in the morning with the patient.

## 2020-07-29 NOTE — Progress Notes (Signed)
CARDIOLOGY OFFICE NOTE  Date:  07/30/2020    [name] Date of Birth: 01-28-1935 Medical Record #[id number]  PCP:  Caren Macadam, MD  Cardiologist:  [facility]  Chief Complaint  Patient presents with  . Follow-up  . Atrial Fibrillation    Seen for Dr. Marlou Porch    History of Present Illness: [name] is a 85 y.o. female who presents today for a work in visit. Seen for Dr. Marlou Porch.   She has a history of AF, prior anticoagulation - has been off and on Eliquis, thrombocytopenia - followed by hematology (Dr. Marin Olp).  In 2019 noted to be in SVT either flutter versus atrial tachycardia. Diltiazem was increased to 180 once a day.  Has been in the AF clinic as well. Noted that she has had persistent AF for over a year - had been started on Eliquis - previously has not tolerated.   She did end up being cardioverted back to NSR in October of 2020.   Last seen in May by Dr. Marlou Porch. Felt to be doing ok. Chronic venous insufficiency.   Called yesterday with 6 weeks of AF. Thus added to my schedule for today.   Comes in today. Here with her daughter. Daughter augments the history. She is really not feeling bad. They note that they saw Endocrine back in May and was told her HR was "jumping around". Saw Dr. Marin Olp 6 weeks and had EKG - confirming AF. Was discussed again this week and referred back here. She is more concerned that her BP is too high - has gone up to 178 at times. They are using a wrist cuff. She is doing Pilates twice a week - getting a wrist cuff BP as well. She is not on any rate slowing medicines. She is not having chest pain. Not short of breath. No syncope. Not lightheaded or dizzy. She is not on any AV nodal blocking agents. She is not on any anticoagulation.   CHADSVASC is 46 (age, gender, HTN, DM)  Past Medical History:  Diagnosis Date  . Allergic rhinitis   . Anxiety state, unspecified    panic attacks  . CHF (congestive heart  failure) ([place])   . Chronic respiratory failure with hypoxia ([place]) 07/03/2019  . Depressive disorder, not elsewhere classified   . Extrinsic asthma, unspecified    no problem since adulthood  . HCAP (healthcare-associated pneumonia)   . Obesity   . OSA on CPAP    severe  . Pneumonitis 07/14/2019  . Pressure injury of skin 07/10/2019  . Pure hypercholesterolemia   . Respiratory failure ([place]) 08/24/2015  . Respiratory failure with hypoxia ([place]) 09/2008   acute, secondary to multiple bilateral pulmonary embolism , negative hypercoagulable workup 09/2008 hospital stay  . Scoliosis   . Type II or unspecified type diabetes mellitus without mention of complication, not stated as uncontrolled   . Unspecified essential hypertension   . Unspecified hypothyroidism    hypo    Past Surgical History:  Procedure Laterality Date  . CARDIOVERSION N/A 06/26/2019   Procedure: CARDIOVERSION;  Surgeon: Jerline Pain, MD;  Location: [facility] ENDOSCOPY;  Service: Cardiovascular;  Laterality: N/A;  . EYE SURGERY Bilateral 2005   cataracts, implants  . KIDNEY STONE SURGERY Bilateral    2016, 2017   . knee replaced Left 2013  . THYROID SURGERY  1966   nodule removal; partial thyroidectomy; radioactive iodine x2; 1990's     Medications: Current Meds  Medication Sig  .  acetaminophen (TYLENOL) 325 MG tablet Take 650 mg by mouth every 6 (six) hours as needed for moderate pain or fever (for fever or pain/discomfort).   . Amino Acids-Protein Hydrolys (FEEDING SUPPLEMENT, PRO-STAT SUGAR FREE 64,) LIQD Take 30 mLs by mouth 2 (two) times daily.  Marland Kitchen atorvastatin (LIPITOR) 40 MG tablet Take 40 mg by mouth at bedtime.   . bisacodyl (DULCOLAX) 5 MG EC tablet Take 10 mg by mouth every 3 (three) days as needed (for constipation).   . Cholecalciferol (VITAMIN D3) 2000 units TABS Take 2,000 Units by mouth daily.  . Continuous Blood Gluc Sensor (FREESTYLE LIBRE 14 DAY SENSOR) MISC Apply new sensor every 14 days.  .  fenofibrate 54 MG tablet TAKE 1 TABLET(54 MG) BY MOUTH DAILY  . fexofenadine (ALLEGRA) 180 MG tablet Take 180 mg by mouth daily.  . furosemide (LASIX) 20 MG tablet TAKE 1 TABLET BY MOUTH FOUR TIMES WEEKLY, ALTERNATING WITH 40MG  THREE TIMES WEEKLY  . furosemide (LASIX) 40 MG tablet TAKE 1 TABLET BY MOUTH THREE TIMES WEEKLY, ALTERNATING WITH 20MG  FOUR TIMES WEEKLY  . hyoscyamine (LEVSIN) 0.125 MG tablet Take 1 tablet by mouth in the morning and at bedtime.   . insulin glargine (LANTUS SOLOSTAR) 100 UNIT/ML Solostar Pen Inject 50 Units into the skin daily.  . insulin lispro (HUMALOG KWIKPEN) 100 UNIT/ML KwikPen Inject 0.13-0.2 mLs (13-20 Units total) into the skin with breakfast, with lunch, and with evening meal.  . Insulin Pen Needle 32G X 4 MM MISC Use 4x a day  . Insulin Syringe-Needle U-100 (INSULIN SYRINGE .3CC/29GX1/2") 29G X 1/2" 0.3 ML MISC Use to inject insulin 4 times a day.  . levothyroxine (SYNTHROID) 112 MCG tablet TAKE 1 TABLET BY MOUTH EVERY DAY  . Multiple Vitamin (MULTIVITAMIN) tablet Take 1 tablet by mouth daily.    . potassium citrate (UROCIT-K) 10 MEQ (1080 MG) SR tablet Take 10 mEq by mouth in the morning and at bedtime.   . sertraline (ZOLOFT) 50 MG tablet TAKE 1 TABLET BY MOUTH AT BEDTIME  . vitamin C (ASCORBIC ACID) 250 MG tablet Take 250 mg by mouth daily.     Allergies: Allergies  Allergen Reactions  . Adhesive [Tape] Other (See Comments)    PULLS OFF THE SKIN  . Apixaban Other (See Comments)    Internal Bleeding  . Aspirin Itching, Rash, Hives and Swelling    Swelling of her tongue  . Mirabegron Other (See Comments)    Patient experienced A-Fib  . Pineapple Anaphylaxis and Swelling    Throat swells and blisters on tongue and roof of mouth per patient  . Metformin Diarrhea and Nausea Only  . Tetracycline Hives  . Fluticasone-Salmeterol Itching and Rash  . Iodinated Diagnostic Agents Itching and Rash       . Lactose Intolerance (Gi) Other (See Comments)     Gas   . Latex Itching, Rash and Other (See Comments)    Pulls off the skin and causes welts  . Lisinopril Cough  . Metronidazole Other (See Comments)    Reaction not known  . Sulfa Antibiotics Rash  . Sulfonamide Derivatives Itching and Rash    Social History: The patient  reports that she quit smoking about 30 years ago. Her smoking use included cigarettes. She has a 5.00 pack-year smoking history. She has never used smokeless tobacco. She reports that she does not drink alcohol and does not use drugs.   Family History: The patient's family history includes Allergies in her mother; Arthritis in her  daughter; Asthma in her mother; Clotting disorder in her maternal grandmother, maternal uncle, and mother; Coronary artery disease in an other family member; Depression in her maternal grandfather; Diabetes in her daughter and paternal grandmother; High Cholesterol in her daughter; High blood pressure in her daughter; Hyperlipidemia in her father; Hypertension in her father and mother; Osteoarthritis in her mother; Rheum arthritis in her maternal grandmother; Stroke in her mother and another family member.   Review of Systems: Please see the history of present illness.   All other systems are reviewed and negative.   Physical Exam: VS:  BP (!) 142/80   Pulse 80   Ht 5\' 1"  (1.549 m)   Wt 219 lb (99.3 kg)   LMP  (LMP Unknown)   SpO2 95%   BMI 41.38 kg/m  .  BMI Body mass index is 41.38 kg/m.  Wt Readings from Last 3 Encounters:  07/30/20 219 lb (99.3 kg)  07/25/20 216 lb 8 oz (98.2 kg)  07/08/20 223 lb 3.2 oz (101.2 kg)    General: Elderly. Alert and in no acute distress.  Using a walker.  Cardiac: Regular rate and rhythm. No murmurs, rubs, or gallops. No edema.  Respiratory:  Lungs are clear to auscultation bilaterally with normal work of breathing.  GI: Soft and nontender.  MS: No deformity or atrophy. Gait and ROM intact.  Skin: Warm and dry. Color is normal.  Neuro:  Strength  and sensation are intact and no gross focal deficits noted.  Psych: Alert, appropriate and with normal affect.   LABORATORY DATA:  EKG:  EKG is ordered today.  Personally reviewed by me. This demonstrates AF with controlled VR of 80.  Lab Results  Component Value Date   WBC 4.8 07/25/2020   HGB 11.4 (L) 07/25/2020   HCT 34.0 (L) 07/25/2020   PLT 105 (L) 07/25/2020   GLUCOSE 158 (H) 07/25/2020   CHOL 131 05/19/2018   TRIG (H) 05/19/2018    656.0 Triglyceride is over 400; calculations on Lipids are invalid.   HDL 26.70 (L) 05/19/2018   LDLDIRECT 41.0 05/19/2018   LDLCALC 65 08/16/2009   ALT 11 07/25/2020   AST 12 (L) 07/25/2020   NA 140 07/25/2020   K 4.3 07/25/2020   CL 101 07/25/2020   CREATININE 1.65 (H) 07/25/2020   BUN 56 (H) 07/25/2020   CO2 29 07/25/2020   TSH 0.62 05/21/2020   INR 1.4 (H) 07/03/2019   HGBA1C 6.3 (A) 05/17/2020   MICROALBUR 17.3 (H) 09/11/2015     BNP (last 3 results) No results for input(s): BNP in the last 8760 hours.  ProBNP (last 3 results) No results for input(s): PROBNP in the last 8760 hours.   Other Studies Reviewed Today:  ECHO IMPRESSIONS 06/2019  1. Left ventricular ejection fraction, by visual estimation, is 50 to  55%. The left ventricle has normal function. There is no left ventricular  hypertrophy.  2. Elevated left ventricular end-diastolic pressure.  3. Left ventricular diastolic parameters are consistent with Grade I  diastolic dysfunction (impaired relaxation).  4. Global right ventricle has normal systolic function.The right  ventricular size is normal. No increase in right ventricular wall  thickness.  5. Left atrial size was mildly dilated.  6. Right atrial size was normal.  7. Small pericardial effusion.  8. The mitral valve is normal in structure. Trace mitral valve  regurgitation. No evidence of mitral stenosis.  9. The tricuspid valve is normal in structure. Tricuspid valve  regurgitation is mild.  10. The aortic valve is normal in structure. Aortic valve regurgitation is  trivial. No evidence of aortic valve sclerosis or stenosis.  11. There is Mild calcification of the aortic valve.  12. There is Mild thickening of the aortic valve.  13. The pulmonic valve was normal in structure. Pulmonic valve  regurgitation is not visualized.  14. Moderately elevated pulmonary artery systolic pressure.  15. The inferior vena cava is dilated in size with <50% respiratory  variability, suggesting right atrial pressure of 15 mmHg.    ASSESSMENT & PLAN:    1. Persistent AF - her rate is controlled - she is not on any medicines for this. She does not wish to be back on anticoagulation due to issues with significant bruising prior and risk of bleeding - previously determined to not be a candidate for anticoagulation or ablation - or [place]. She is aware of her increased risk of stroke. No indication for cardioversion. Her rate today is fine. They are both happy with this plan.   2. Chronic Thrombocytopenia - per Dr. Marin Olp  3. HTN - recheck by me is 130/80. I suspect some of the issue is using a wrong cuff and wrong size. For now, would not add additional medicines - let's let them get a new cuff and monitor.   4. DM - per PCP  Current medicines are reviewed with the patient today.  The patient does not have concerns regarding medicines other than what has been noted above.  The following changes have been made:  See above.  Labs/ tests ordered today include:    Orders Placed This Encounter  Procedures  . EKG 12-Lead     Disposition:   FU with Dr. Marlou Porch in 4 to 6 weeks.    Patient is agreeable to this plan and will call if any problems develop in the interim.   SignedTruitt Merle, NP  07/30/2020 8:50 AM  [facility] [address] [place], [place]  [address] Phone: [phone] Fax: [phone]

## 2020-07-30 ENCOUNTER — Ambulatory Visit (INDEPENDENT_AMBULATORY_CARE_PROVIDER_SITE_OTHER): Payer: Medicare Other | Admitting: Nurse Practitioner

## 2020-07-30 ENCOUNTER — Other Ambulatory Visit: Payer: Self-pay

## 2020-07-30 ENCOUNTER — Encounter: Payer: Self-pay | Admitting: Nurse Practitioner

## 2020-07-30 VITALS — BP 142/80 | HR 80 | Ht 61.0 in | Wt 219.0 lb

## 2020-07-30 DIAGNOSIS — D696 Thrombocytopenia, unspecified: Secondary | ICD-10-CM

## 2020-07-30 DIAGNOSIS — I480 Paroxysmal atrial fibrillation: Secondary | ICD-10-CM | POA: Diagnosis not present

## 2020-07-30 NOTE — Patient Instructions (Addendum)
After Visit Summary:  We will be checking the following labs today - NONE   Medication Instructions:    Continue with your current medicines for now.    If you need a refill on your cardiac medications before your next appointment, please call your pharmacy.     Testing/Procedures To Be Arranged:  N/A  Follow-Up:   See Dr. Marlou Porch in about 4 to 6 weeks.     At [facility], you and your health needs are our priority.  As part of our continuing mission to provide you with exceptional heart care, we have created designated Provider Care Teams.  These Care Teams include your primary Cardiologist (physician) and Advanced Practice Providers (APPs -  Physician Assistants and Nurse Practitioners) who all work together to provide you with the care you need, when you need it.  Special Instructions:  . Stay safe, wash your hands for at least 20 seconds and wear a mask when needed.  . It was good to talk with you today.  . Get a new BP cuff - extra large that goes on the upper arm.    Call the [place] office at [phone] if you have any questions, problems or concerns.

## 2020-08-08 ENCOUNTER — Other Ambulatory Visit: Payer: Self-pay | Admitting: Internal Medicine

## 2020-08-09 ENCOUNTER — Encounter: Payer: Self-pay | Admitting: Family Medicine

## 2020-08-09 DIAGNOSIS — E7800 Pure hypercholesterolemia, unspecified: Secondary | ICD-10-CM

## 2020-08-10 ENCOUNTER — Other Ambulatory Visit: Payer: Self-pay | Admitting: Family Medicine

## 2020-08-14 ENCOUNTER — Other Ambulatory Visit: Payer: Self-pay

## 2020-08-14 ENCOUNTER — Other Ambulatory Visit (INDEPENDENT_AMBULATORY_CARE_PROVIDER_SITE_OTHER): Payer: Medicare Other

## 2020-08-14 DIAGNOSIS — E7800 Pure hypercholesterolemia, unspecified: Secondary | ICD-10-CM

## 2020-08-14 LAB — LIPID PANEL
Cholesterol: 141 mg/dL (ref <200)
HDL: 27 mg/dL — ABNORMAL LOW (ref >=50)
LDL Cholesterol (Calc): 74 mg/dL (calc)
Non-HDL Cholesterol (Calc): 114 mg/dL (calc) (ref <130)
Total CHOL/HDL Ratio: 5.2 (calc) — ABNORMAL HIGH (ref <5.0)
Triglycerides: 334 mg/dL — ABNORMAL HIGH (ref <150)

## 2020-08-14 NOTE — Addendum Note (Signed)
Addended by: Marrion Coy on: 08/14/2020 10:18 AM   Modules accepted: Orders

## 2020-08-28 DIAGNOSIS — M7711 Lateral epicondylitis, right elbow: Secondary | ICD-10-CM | POA: Insufficient documentation

## 2020-09-05 ENCOUNTER — Inpatient Hospital Stay: Payer: Medicare Other | Attending: Hematology & Oncology

## 2020-09-05 ENCOUNTER — Inpatient Hospital Stay (HOSPITAL_BASED_OUTPATIENT_CLINIC_OR_DEPARTMENT_OTHER): Payer: Medicare Other | Admitting: Family

## 2020-09-05 ENCOUNTER — Encounter: Payer: Self-pay | Admitting: Family

## 2020-09-05 ENCOUNTER — Telehealth: Payer: Self-pay | Admitting: Family

## 2020-09-05 ENCOUNTER — Other Ambulatory Visit: Payer: Self-pay

## 2020-09-05 ENCOUNTER — Inpatient Hospital Stay: Payer: Medicare Other

## 2020-09-05 VITALS — BP 182/64 | HR 77 | Temp 98.0°F | Resp 18 | Ht 61.0 in | Wt 220.0 lb

## 2020-09-05 DIAGNOSIS — D693 Immune thrombocytopenic purpura: Secondary | ICD-10-CM

## 2020-09-05 DIAGNOSIS — Z86711 Personal history of pulmonary embolism: Secondary | ICD-10-CM | POA: Insufficient documentation

## 2020-09-05 DIAGNOSIS — G629 Polyneuropathy, unspecified: Secondary | ICD-10-CM | POA: Insufficient documentation

## 2020-09-05 DIAGNOSIS — I4891 Unspecified atrial fibrillation: Secondary | ICD-10-CM | POA: Insufficient documentation

## 2020-09-05 DIAGNOSIS — N184 Chronic kidney disease, stage 4 (severe): Secondary | ICD-10-CM

## 2020-09-05 DIAGNOSIS — D649 Anemia, unspecified: Secondary | ICD-10-CM

## 2020-09-05 DIAGNOSIS — D500 Iron deficiency anemia secondary to blood loss (chronic): Secondary | ICD-10-CM | POA: Diagnosis not present

## 2020-09-05 DIAGNOSIS — D509 Iron deficiency anemia, unspecified: Secondary | ICD-10-CM | POA: Diagnosis not present

## 2020-09-05 LAB — CBC WITH DIFFERENTIAL (CANCER CENTER ONLY)
Abs Immature Granulocytes: 0.01 10*3/uL (ref 0.00–0.07)
Basophils Absolute: 0 10*3/uL (ref 0.0–0.1)
Basophils Relative: 0 %
Eosinophils Absolute: 0.2 10*3/uL (ref 0.0–0.5)
Eosinophils Relative: 3 %
HCT: 32.5 % — ABNORMAL LOW (ref 36.0–46.0)
Hemoglobin: 10.8 g/dL — ABNORMAL LOW (ref 12.0–15.0)
Immature Granulocytes: 0 %
Lymphocytes Relative: 16 %
Lymphs Abs: 0.9 10*3/uL (ref 0.7–4.0)
MCH: 30.9 pg (ref 26.0–34.0)
MCHC: 33.2 g/dL (ref 30.0–36.0)
MCV: 93.1 fL (ref 80.0–100.0)
Monocytes Absolute: 0.4 10*3/uL (ref 0.1–1.0)
Monocytes Relative: 7 %
Neutro Abs: 4.3 10*3/uL (ref 1.7–7.7)
Neutrophils Relative %: 74 %
Platelet Count: 111 10*3/uL — ABNORMAL LOW (ref 150–400)
RBC: 3.49 MIL/uL — ABNORMAL LOW (ref 3.87–5.11)
RDW: 15.9 % — ABNORMAL HIGH (ref 11.5–15.5)
WBC Count: 5.8 10*3/uL (ref 4.0–10.5)
nRBC: 0 % (ref 0.0–0.2)

## 2020-09-05 LAB — CMP (CANCER CENTER ONLY)
ALT: 12 U/L (ref 0–44)
AST: 13 U/L — ABNORMAL LOW (ref 15–41)
Albumin: 4.1 g/dL (ref 3.5–5.0)
Alkaline Phosphatase: 65 U/L (ref 38–126)
Anion gap: 8 (ref 5–15)
BUN: 47 mg/dL — ABNORMAL HIGH (ref 8–23)
CO2: 32 mmol/L (ref 22–32)
Calcium: 10.1 mg/dL (ref 8.9–10.3)
Chloride: 100 mmol/L (ref 98–111)
Creatinine: 1.7 mg/dL — ABNORMAL HIGH (ref 0.44–1.00)
GFR, Estimated: 30 mL/min — ABNORMAL LOW (ref >60)
Glucose, Bld: 164 mg/dL — ABNORMAL HIGH (ref 70–99)
Potassium: 4.3 mmol/L (ref 3.5–5.1)
Sodium: 140 mmol/L (ref 135–145)
Total Bilirubin: 0.8 mg/dL (ref 0.3–1.2)
Total Protein: 7.2 g/dL (ref 6.5–8.1)

## 2020-09-05 LAB — CBC AND DIFFERENTIAL: Hemoglobin: 10.9 — AB (ref 12.0–16.0)

## 2020-09-05 LAB — COMPREHENSIVE METABOLIC PANEL: Calcium: 9.7 (ref 8.7–10.7)

## 2020-09-05 NOTE — Progress Notes (Signed)
Hematology and Oncology Follow Up Visit  [name] [id number] 1934-09-08 85 y.o. 09/05/2020   Principle Diagnosis:  Immune based thrombocytopenia History of PE Iron deficiency anemia Atrial fib  Current Therapy: Nplate q3week for platelet count < 100K IV Iron as indicated   Interim History:  [name] is here today with her daughter for follow-up. She continues to do well and has no complaints at this time.  She is doing her piliates and states that her balance has returned and she ambulates well with her Rolator.  No swelling or tenderness in her extremities.  The neuropathy in her hands and feet is stable.  No falls or syncope.  No fever, chills, n/v, cough, rash, dizziness, SOB, chest pain, palpitations, abdominal pain or changes in bowel or bladder habits. She has not noted any blood loss. No abnormal bruising or petechiae.  She has maintained a good appetite and is staying well hydrated. Her weight is stable.  She states that she follows up with cardiology and nephrology next week.   ECOG Performance Status: 1 - Symptomatic but completely ambulatory  Medications:  Allergies as of 09/05/2020      Reactions   Adhesive [tape] Other (See Comments)   PULLS OFF THE SKIN   Apixaban Other (See Comments)   Internal Bleeding   Aspirin Itching, Rash, Hives, Swelling   Swelling of her tongue   Mirabegron Other (See Comments)   Patient experienced A-Fib   Pineapple Anaphylaxis, Swelling   Throat swells and blisters on tongue and roof of mouth per patient   Metformin Diarrhea, Nausea Only   Tetracycline Hives   Fluticasone-salmeterol Itching, Rash   Iodinated Diagnostic Agents Itching, Rash      Lactose Intolerance (gi) Other (See Comments)   Gas   Latex Itching, Rash, Other (See Comments)   Pulls off the skin and causes welts   Lisinopril Cough   Metronidazole Other (See Comments)   Reaction not known   Sulfa Antibiotics Rash   Sulfonamide  Derivatives Itching, Rash      Medication List       Accurate as of September 05, 2020  1:54 PM. If you have any questions, ask your nurse or doctor.        acetaminophen 325 MG tablet Commonly known as: TYLENOL Take 650 mg by mouth every 6 (six) hours as needed for moderate pain or fever (for fever or pain/discomfort).   atorvastatin 40 MG tablet Commonly known as: LIPITOR Take 40 mg by mouth at bedtime.   BD Pen Needle Nano 2nd Gen 32G X 4 MM Misc Generic drug: Insulin Pen Needle USE TO INJECT UP TO FOUR TIMES DAILY AS DIRECTED   bisacodyl 5 MG EC tablet Commonly known as: DULCOLAX Take 10 mg by mouth every 3 (three) days as needed (for constipation).   feeding supplement (PRO-STAT SUGAR FREE 64) Liqd Take 30 mLs by mouth 2 (two) times daily.   fexofenadine 180 MG tablet Commonly known as: ALLEGRA Take 180 mg by mouth daily.   FreeStyle Libre 14 Day Sensor Misc Apply new sensor every 14 days.   furosemide 40 MG tablet Commonly known as: LASIX TAKE 1 TABLET BY MOUTH THREE TIMES WEEKLY, ALTERNATING WITH 20MG  FOUR TIMES WEEKLY   furosemide 20 MG tablet Commonly known as: LASIX TAKE 1 TABLET BY MOUTH FOUR TIMES WEEKLY, ALTERNATING WITH 40MG  THREE TIMES WEEKLY   hyoscyamine 0.125 MG tablet Commonly known as: LEVSIN Take 1 tablet by mouth in the morning and at bedtime.  insulin lispro 100 UNIT/ML KwikPen Commonly known as: HUMALOG INJECT 13-20 UNITS UNDER THE SKIN WITH BREAKFAST, LUNCH, AND EVENING MEAL   INSULIN SYRINGE .3CC/29GX1/2" 29G X 1/2" 0.3 ML Misc Use to inject insulin 4 times a day.   Lantus SoloStar 100 UNIT/ML Solostar Pen Generic drug: insulin glargine Inject 50 Units into the skin daily.   levothyroxine 112 MCG tablet Commonly known as: SYNTHROID TAKE 1 TABLET BY MOUTH EVERY DAY   multivitamin tablet Take 1 tablet by mouth daily.   potassium citrate 10 MEQ (1080 MG) SR tablet Commonly known as: UROCIT-K Take 10 mEq by mouth in the morning  and at bedtime.   sertraline 50 MG tablet Commonly known as: ZOLOFT TAKE 1 TABLET BY MOUTH AT BEDTIME   vitamin C 250 MG tablet Commonly known as: ASCORBIC ACID Take 250 mg by mouth daily.   Vitamin D3 50 MCG (2000 UT) Tabs Take 2,000 Units by mouth daily.       Allergies:  Allergies  Allergen Reactions  . Adhesive [Tape] Other (See Comments)    PULLS OFF THE SKIN  . Apixaban Other (See Comments)    Internal Bleeding  . Aspirin Itching, Rash, Hives and Swelling    Swelling of her tongue  . Mirabegron Other (See Comments)    Patient experienced A-Fib  . Pineapple Anaphylaxis and Swelling    Throat swells and blisters on tongue and roof of mouth per patient  . Metformin Diarrhea and Nausea Only  . Tetracycline Hives  . Fluticasone-Salmeterol Itching and Rash  . Iodinated Diagnostic Agents Itching and Rash       . Lactose Intolerance (Gi) Other (See Comments)    Gas   . Latex Itching, Rash and Other (See Comments)    Pulls off the skin and causes welts  . Lisinopril Cough  . Metronidazole Other (See Comments)    Reaction not known  . Sulfa Antibiotics Rash  . Sulfonamide Derivatives Itching and Rash    Past Medical History, Surgical history, Social history, and Family History were reviewed and updated.  Review of Systems: All other 10 point review of systems is negative.   Physical Exam:  vitals were not taken for this visit.   Wt Readings from Last 3 Encounters:  07/30/20 219 lb (99.3 kg)  07/25/20 216 lb 8 oz (98.2 kg)  07/08/20 223 lb 3.2 oz (101.2 kg)    Ocular: Sclerae unicteric, pupils equal, round and reactive to light Ear-nose-throat: Oropharynx clear, dentition fair Lymphatic: No cervical or supraclavicular adenopathy Lungs no rales or rhonchi, good excursion bilaterally Heart regular rate and rhythm, no murmur appreciated Abd soft, nontender, positive bowel sounds MSK no focal spinal tenderness, no joint edema Neuro: non-focal, well-oriented,  appropriate affect Breasts: Deferred   Lab Results  Component Value Date   WBC 5.8 09/05/2020   HGB 10.8 (L) 09/05/2020   HCT 32.5 (L) 09/05/2020   MCV 93.1 09/05/2020   PLT 111 (L) 09/05/2020   Lab Results  Component Value Date   FERRITIN 287 07/25/2020   IRON 74 07/25/2020   TIBC 301 07/25/2020   UIBC 227 07/25/2020   IRONPCTSAT 25 07/25/2020   Lab Results  Component Value Date   RETICCTPCT 2.8 07/05/2019   RBC 3.49 (L) 09/05/2020   RETICCTABS 73.3 01/28/2011   No results found for: KPAFRELGTCHN, LAMBDASER, KAPLAMBRATIO No results found for: IGGSERUM, IGA, IGMSERUM No results found for: TOTALPROTELP, ALBUMINELP, A1GS, A2GS, BETS, BETA2SER, [place], [place], SPEI   Chemistry  Component Value Date/Time   NA 140 07/25/2020 1258   NA 145 09/08/2017 1317   NA 142 02/25/2017 1013   K 4.3 07/25/2020 1258   K 4.5 09/08/2017 1317   K 3.8 02/25/2017 1013   CL 101 07/25/2020 1258   CL 98 09/08/2017 1317   CO2 29 07/25/2020 1258   CO2 32 09/08/2017 1317   CO2 26 02/25/2017 1013   BUN 56 (H) 07/25/2020 1258   BUN 34 (H) 09/08/2017 1317   BUN 20.1 02/25/2017 1013   CREATININE 1.65 (H) 07/25/2020 1258   CREATININE 1.9 (H) 09/08/2017 1317   CREATININE 1.8 (H) 02/25/2017 1013      Component Value Date/Time   CALCIUM 10.3 07/25/2020 1258   CALCIUM 9.3 09/08/2017 1317   CALCIUM 8.7 02/25/2017 1013   ALKPHOS 66 07/25/2020 1258   ALKPHOS 45 09/08/2017 1317   ALKPHOS 65 02/25/2017 1013   AST 12 (L) 07/25/2020 1258   AST 16 02/25/2017 1013   ALT 11 07/25/2020 1258   ALT 26 09/08/2017 1317   ALT 13 02/25/2017 1013   BILITOT 0.7 07/25/2020 1258   BILITOT 0.76 02/25/2017 1013       Impression and Plan: Ms. Markel is a very pleasant 85yo caucasian female with immune based thrombocytopenia. No Nplate needed, platelets 111.  Iron studies are pending.We will replace if needed.  Follow-up in 8 weeks.  They can contact our office with any questions or concerns. We can  certainly see her sooner if needed.   Laverna Peace, NP 12/30/20211:54 PM

## 2020-09-05 NOTE — Telephone Encounter (Signed)
Appointments scheduled calendar printed per 12/30 los 

## 2020-09-09 LAB — IRON AND TIBC
Iron: 66 ug/dL (ref 41–142)
Saturation Ratios: 24 % (ref 21–57)
TIBC: 279 ug/dL (ref 236–444)
UIBC: 213 ug/dL (ref 120–384)

## 2020-09-09 LAB — FERRITIN: Ferritin: 296 ng/mL (ref 11–307)

## 2020-09-12 ENCOUNTER — Other Ambulatory Visit: Payer: Self-pay

## 2020-09-12 ENCOUNTER — Ambulatory Visit (INDEPENDENT_AMBULATORY_CARE_PROVIDER_SITE_OTHER): Payer: Medicare Other | Admitting: Cardiology

## 2020-09-12 ENCOUNTER — Encounter: Payer: Self-pay | Admitting: Cardiology

## 2020-09-12 VITALS — BP 144/78 | HR 77 | Ht 61.0 in | Wt 219.6 lb

## 2020-09-12 DIAGNOSIS — I480 Paroxysmal atrial fibrillation: Secondary | ICD-10-CM | POA: Diagnosis not present

## 2020-09-12 DIAGNOSIS — I483 Typical atrial flutter: Secondary | ICD-10-CM

## 2020-09-12 DIAGNOSIS — D696 Thrombocytopenia, unspecified: Secondary | ICD-10-CM

## 2020-09-12 NOTE — Progress Notes (Signed)
Cardiology Office Note:    Date:  09/12/2020   ID:  [name], DOB [birth year], MRN [id number]  PCP:  Caren Macadam, MD  [facility] Cardiologist:  [name], MD  [facility] Electrophysiologist:  None   Referring MD: Caren Macadam, MD    History of Present Illness:    [name] is a 86 y.o. female here for the follow-up of atrial fibrillation.   Sees Dr. Marin Olp for thrombocytopenia.  Had cardioversion in October 2020  Atrial fibrillation returned in May  Past Medical History:  Diagnosis Date  . Allergic rhinitis   . Anxiety state, unspecified    panic attacks  . CHF (congestive heart failure) ([place])   . Chronic respiratory failure with hypoxia ([place]) 07/03/2019  . Depressive disorder, not elsewhere classified   . Extrinsic asthma, unspecified    no problem since adulthood  . HCAP (healthcare-associated pneumonia)   . Obesity   . OSA on CPAP    severe  . Pneumonitis 07/14/2019  . Pressure injury of skin 07/10/2019  . Pure hypercholesterolemia   . Respiratory failure ([place]) 08/24/2015  . Respiratory failure with hypoxia ([place]) 09/2008   acute, secondary to multiple bilateral pulmonary embolism , negative hypercoagulable workup 09/2008 hospital stay  . Scoliosis   . Type II or unspecified type diabetes mellitus without mention of complication, not stated as uncontrolled   . Unspecified essential hypertension   . Unspecified hypothyroidism    hypo    Past Surgical History:  Procedure Laterality Date  . CARDIOVERSION N/A 06/26/2019   Procedure: CARDIOVERSION;  Surgeon: Jerline Pain, MD;  Location: [facility] ENDOSCOPY;  Service: Cardiovascular;  Laterality: N/A;  . EYE SURGERY Bilateral 2005   cataracts, implants  . KIDNEY STONE SURGERY Bilateral    2016, 2017   . knee replaced Left 2013  . THYROID SURGERY  1966   nodule removal; partial thyroidectomy; radioactive iodine x2; 1990's    Current Medications: Current Meds   Medication Sig  . acetaminophen (TYLENOL) 325 MG tablet Take 650 mg by mouth every 6 (six) hours as needed for moderate pain or fever (for fever or pain/discomfort).   . Amino Acids-Protein Hydrolys (FEEDING SUPPLEMENT, PRO-STAT SUGAR FREE 64,) LIQD Take 30 mLs by mouth 2 (two) times daily.  Marland Kitchen atorvastatin (LIPITOR) 40 MG tablet Take 40 mg by mouth at bedtime.   . BD PEN NEEDLE NANO 2ND GEN 32G X 4 MM MISC USE TO INJECT UP TO FOUR TIMES DAILY AS DIRECTED  . bisacodyl (DULCOLAX) 5 MG EC tablet Take 10 mg by mouth every 3 (three) days as needed (for constipation).   . Cholecalciferol (VITAMIN D3) 2000 units TABS Take 2,000 Units by mouth daily.  . Continuous Blood Gluc Sensor (FREESTYLE LIBRE 14 DAY SENSOR) MISC Apply new sensor every 14 days.  . fexofenadine (ALLEGRA) 180 MG tablet Take 180 mg by mouth daily.  . furosemide (LASIX) 20 MG tablet TAKE 1 TABLET BY MOUTH FOUR TIMES WEEKLY, ALTERNATING WITH 40MG  THREE TIMES WEEKLY  . furosemide (LASIX) 40 MG tablet TAKE 1 TABLET BY MOUTH THREE TIMES WEEKLY, ALTERNATING WITH 20MG  FOUR TIMES WEEKLY  . hyoscyamine (LEVSIN) 0.125 MG tablet Take 1 tablet by mouth in the morning and at bedtime.   . insulin glargine (LANTUS SOLOSTAR) 100 UNIT/ML Solostar Pen Inject 50 Units into the skin daily.  . insulin lispro (HUMALOG) 100 UNIT/ML KwikPen INJECT 13-20 UNITS UNDER THE SKIN WITH BREAKFAST, LUNCH, AND EVENING MEAL  . Insulin Syringe-Needle U-100 (  INSULIN SYRINGE .3CC/29GX1/2") 29G X 1/2" 0.3 ML MISC Use to inject insulin 4 times a day.  . levothyroxine (SYNTHROID) 112 MCG tablet TAKE 1 TABLET BY MOUTH EVERY DAY  . Multiple Vitamin (MULTIVITAMIN) tablet Take 1 tablet by mouth daily.  . potassium citrate (UROCIT-K) 10 MEQ (1080 MG) SR tablet Take 10 mEq by mouth in the morning and at bedtime.   . sertraline (ZOLOFT) 50 MG tablet TAKE 1 TABLET BY MOUTH AT BEDTIME  . vitamin C (ASCORBIC ACID) 250 MG tablet Take 250 mg by mouth daily.     Allergies:   Adhesive  [tape], Apixaban, Aspirin, Mirabegron, Pineapple, Metformin, Tetracycline, Fluticasone-salmeterol, Iodinated diagnostic agents, Lactose intolerance (gi), Latex, Lisinopril, Metronidazole, Sulfa antibiotics, and Sulfonamide derivatives   Social History   Socioeconomic History  . Marital status: Widowed    Spouse name: Not on file  . Number of children: 2  . Years of education: Not on file  . Highest education level: Not on file  Occupational History  . Occupation: OWNER    Employer: [employer]    Comment: Self employed- runs Advertising copywriter  Tobacco Use  . Smoking status: Former Smoker    Packs/day: 0.25    Years: 20.00    Pack years: 5.00    Types: Cigarettes    Quit date: 09/07/1989    Years since quitting: 31.0  . Smokeless tobacco: Never Used  Vaping Use  . Vaping Use: Never used  Substance and Sexual Activity  . Alcohol use: No  . Drug use: No  . Sexual activity: Not Currently  Other Topics Concern  . Not on file  Social History Narrative   [place]ives [place] Strain: Not on file  Food Insecurity: Not on file  Transportation Needs: Not on file  Physical Activity: Not on file  Stress: Not on file  Social Connections: Not on file     Family History: The patient's family history includes Allergies in her mother; Arthritis in her daughter; Asthma in her mother; Clotting disorder in her maternal grandmother, maternal uncle, and mother; Coronary artery disease in an other family member; Depression in her maternal grandfather; Diabetes in her daughter and paternal grandmother; High Cholesterol in her daughter; High blood pressure in her daughter; Hyperlipidemia in her father; Hypertension in her father and mother; Osteoarthritis in her mother; Rheum arthritis in her maternal grandmother; Stroke in her mother and another family member. There is no history of Breast cancer.  ROS:   Please see the history of present  illness.     All other systems reviewed and are negative.  EKGs/Labs/Other Studies Reviewed:    The following studies were reviewed today:   ECHO IMPRESSIONS 06/2019  1. Left ventricular ejection fraction, by visual estimation, is 50 to  55%. The left ventricle has normal function. There is no left ventricular  hypertrophy.  2. Elevated left ventricular end-diastolic pressure.  3. Left ventricular diastolic parameters are consistent with Grade I  diastolic dysfunction (impaired relaxation).  4. Global right ventricle has normal systolic function.The right  ventricular size is normal. No increase in right ventricular wall  thickness.  5. Left atrial size was mildly dilated.  6. Right atrial size was normal.  7. Small pericardial effusion.  8. The mitral valve is normal in structure. Trace mitral valve  regurgitation. No evidence of mitral stenosis.  9. The tricuspid valve is normal in structure. Tricuspid valve  regurgitation is mild.  10. The aortic valve is normal in structure. Aortic valve regurgitation is  trivial. No evidence of aortic valve sclerosis or stenosis.  11. There is Mild calcification of the aortic valve.  12. There is Mild thickening of the aortic valve.  13. The pulmonic valve was normal in structure. Pulmonic valve  regurgitation is not visualized.  14. Moderately elevated pulmonary artery systolic pressure.  15. The inferior vena cava is dilated in size with <50% respiratory  variability, suggesting right atrial pressure of 15   EKG:  EKG is  ordered today.  The ekg ordered today demonstrates atrial flutter 70 variable conduction. Prior EKG atrial fibrillation 80  Recent Labs: 07/25/2020: TSH 0.544 09/05/2020: ALT 12; BUN 47; Creatinine 1.70; Hemoglobin 10.8; Platelet Count 111; Potassium 4.3; Sodium 140  Recent Lipid Panel    Component Value Date/Time   CHOL 141 08/14/2020 1017   CHOL 168 07/25/2020 0000   TRIG 334 (H) 08/14/2020 1017    HDL 27 (L) 08/14/2020 1017   HDL 29 (L) 07/25/2020 0000   CHOLHDL 5.2 (H) 08/14/2020 1017   VLDL 118.2 (H) 09/25/2011 1511   LDLCALC 74 08/14/2020 1017   LDLDIRECT 41.0 05/19/2018 1140     Risk Assessment/Calculations:       Physical Exam:    VS:  BP (!) 144/78   Pulse 77   Ht 5\' 1"  (1.549 m)   Wt 219 lb 9.6 oz (99.6 kg)   LMP  (LMP Unknown)   SpO2 95%   BMI 41.49 kg/m     Wt Readings from Last 3 Encounters:  09/12/20 219 lb 9.6 oz (99.6 kg)  09/05/20 220 lb (99.8 kg)  07/30/20 219 lb (99.3 kg)     GEN:  Well nourished, well developed in no acute distress HEENT: Normal NECK: No JVD; No carotid bruits LYMPHATICS: No lymphadenopathy CARDIAC: Fairly regular pattern, no murmurs, rubs, gallops RESPIRATORY:  Clear to auscultation without rales, wheezing or rhonchi  ABDOMEN: Soft, non-tender, non-distended MUSCULOSKELETAL:  No edema; No deformity  SKIN: Warm and dry NEUROLOGIC:  Alert and oriented x 3 PSYCHIATRIC:  Normal affect   ASSESSMENT:    1. Paroxysmal atrial fibrillation (HCC)   2. Thrombocytopenia ([place])   3. Typical atrial flutter (HCC)    PLAN:    In order of problems listed above:  Persistent rate controlled atrial fibrillation - Did not wish to be on anticoagulation due to significant bruising previously and risk of bleeding.  Has thrombocytopenia.  Previously has been determined not to be a candidate for anticoagulation or ablation or watchman.  No indication for cardioversion.  Chronic thrombocytopenia - Dr. Marin Olp has been monitoring closely.  Essential hypertension - Overall has been reasonably controlled.  Mildly elevated today.  They have a new cuff at home.  Diabetes with hypertension - Managed per primary care physician.        Medication Adjustments/Labs and Tests Ordered: Current medicines are reviewed at length with the patient today.  Concerns regarding medicines are outlined above.  Orders Placed This Encounter  Procedures  .  EKG 12-Lead   No orders of the defined types were placed in this encounter.   Patient Instructions  Medication Instructions:  The current medical regimen is effective;  continue present plan and medications.  *If you need a refill on your cardiac medications before your next appointment, please call your pharmacy*  Follow-Up: At [facility], you and your health needs are our priority.  As part of our continuing mission to provide you  with exceptional heart care, we have created designated Provider Care Teams.  These Care Teams include your primary Cardiologist (physician) and Advanced Practice Providers (APPs -  Physician Assistants and Nurse Practitioners) who all work together to provide you with the care you need, when you need it.  We recommend signing up for the patient portal called "MyChart".  Sign up information is provided on this After Visit Summary.  MyChart is used to connect with patients for Virtual Visits (Telemedicine).  Patients are able to view lab/test results, encounter notes, upcoming appointments, etc.  Non-urgent messages can be sent to your provider as well.   To learn more about what you can do with MyChart, go to [email or web address].    Your next appointment:   6 month(s)  The format for your next appointment:   In Person  Provider:   [name], MD   Thank you for choosing [facility]!!        Signed, [name], MD  09/12/2020 2:36 PM    [place]

## 2020-09-12 NOTE — Patient Instructions (Signed)

## 2020-09-17 ENCOUNTER — Other Ambulatory Visit: Payer: Self-pay

## 2020-09-17 ENCOUNTER — Encounter: Payer: Self-pay | Admitting: Family Medicine

## 2020-09-17 ENCOUNTER — Ambulatory Visit (INDEPENDENT_AMBULATORY_CARE_PROVIDER_SITE_OTHER): Payer: Medicare Other | Admitting: Family Medicine

## 2020-09-17 VITALS — BP 140/78 | HR 75 | Temp 97.8°F | Ht 61.0 in | Wt 221.8 lb

## 2020-09-17 DIAGNOSIS — R590 Localized enlarged lymph nodes: Secondary | ICD-10-CM | POA: Diagnosis not present

## 2020-09-17 NOTE — Progress Notes (Signed)
   Subjective:    Patient ID: [name], female    DOB: 1934/09/19, 86 y.o.   MRN: [id number]  HPI Here with her daughter for a swollen lump on the right side of the neck. This came up suddenly yesterday. She feels fine otherwise, no mouth or gum pain, no ST or cough, no fever, no skin rashes. She says it has already started to go back down in size. She did receive her Moderna Covid booster in the left arm on 09-10-20.    Review of Systems  Constitutional: Negative.   HENT: Negative.   Eyes: Negative.   Respiratory: Negative.   Cardiovascular: Negative.   Hematological: Positive for adenopathy.       Objective:   Physical Exam Constitutional:      Appearance: Normal appearance. She is not ill-appearing.  HENT:     Right Ear: Tympanic membrane, ear canal and external ear normal.     Left Ear: Tympanic membrane, ear canal and external ear normal.     Nose: Nose normal.     Mouth/Throat:     Pharynx: Oropharynx is clear.  Eyes:     Conjunctiva/sclera: Conjunctivae normal.  Neck:     Comments: There is a single enlarged (about 2 cm) slightly tender node in the right anterior neck  Cardiovascular:     Rate and Rhythm: Normal rate and regular rhythm.     Pulses: Normal pulses.     Heart sounds: Normal heart sounds.  Pulmonary:     Effort: Pulmonary effort is normal.     Breath sounds: Normal breath sounds.  Skin:    Findings: No erythema or rash.  Neurological:     Mental Status: She is alert.           Assessment & Plan:  Cervical adenopathy of uncertain etiology. This is likely a benign reactive process, given how quickly it appeared and how quickly it is going back down. She will observe this and follow up if anything changes. Also she will return if this is still present a week from now.  Alysia Penna, MD

## 2020-09-18 ENCOUNTER — Telehealth: Payer: Self-pay | Admitting: Family Medicine

## 2020-09-18 MED ORDER — CEPHALEXIN 500 MG PO CAPS: 500.0000 mg | ORAL_CAPSULE | Freq: Three times a day (TID) | ORAL | 0 refills | Status: DC

## 2020-09-18 NOTE — Telephone Encounter (Signed)
I am sending to you since you saw her yesterday; doesn't look like they were concerned with other mentioned URI sx that are mentioned in phone call? I do have a virtual opening at 2:30 this afternoon if needed, but sounds like they just want something sent in. Sounds like only physical finding yesterday was single lymph node. OK to reply to [facility] with your thoughts to communicate to them. Thanks!

## 2020-09-18 NOTE — Telephone Encounter (Signed)
Patient daughter is calling and stated that patient was seen yesterday and is still not feeling better. Per daughter pt now has a sore throat and hurts to swallow, ear pain (feels like she can't hear out of right ear ) and swollen lymph nodes. Daughter wanted to see if something can be called in, please advise. CB is [phone]

## 2020-09-18 NOTE — Telephone Encounter (Signed)
Please call in Keflex 500 mg TID for 10 days

## 2020-09-18 NOTE — Telephone Encounter (Signed)
Spoke with the pts daughter, offered a virtual visit with another provider as PCP does not have openings and the note from 1/11 does not mention these symptoms.  She stated the pt told the provider this yesterday and its worse today.  Message sent to PCP.

## 2020-09-18 NOTE — Telephone Encounter (Signed)
Spoke with the pts daughter and informed her the Rx was sent to [facility].

## 2020-09-19 ENCOUNTER — Encounter: Payer: Self-pay | Admitting: Internal Medicine

## 2020-09-19 ENCOUNTER — Ambulatory Visit (INDEPENDENT_AMBULATORY_CARE_PROVIDER_SITE_OTHER): Payer: Medicare Other | Admitting: Internal Medicine

## 2020-09-19 ENCOUNTER — Other Ambulatory Visit: Payer: Self-pay

## 2020-09-19 VITALS — BP 128/78 | HR 90 | Ht 61.0 in | Wt 220.4 lb

## 2020-09-19 DIAGNOSIS — E7800 Pure hypercholesterolemia, unspecified: Secondary | ICD-10-CM | POA: Diagnosis not present

## 2020-09-19 DIAGNOSIS — E890 Postprocedural hypothyroidism: Secondary | ICD-10-CM

## 2020-09-19 DIAGNOSIS — N184 Chronic kidney disease, stage 4 (severe): Secondary | ICD-10-CM | POA: Diagnosis not present

## 2020-09-19 DIAGNOSIS — Z794 Long term (current) use of insulin: Secondary | ICD-10-CM | POA: Diagnosis not present

## 2020-09-19 DIAGNOSIS — E1122 Type 2 diabetes mellitus with diabetic chronic kidney disease: Secondary | ICD-10-CM

## 2020-09-19 LAB — POCT GLYCOSYLATED HEMOGLOBIN (HGB A1C): Hemoglobin A1C: 6.2 % — AB (ref 4.0–5.6)

## 2020-09-19 NOTE — Progress Notes (Signed)
Patient ID: [name], female   DOB: 1935/07/26, 86 y.o.   MRN: [id number]   This visit occurred during the SARS-CoV-2 public health emergency.  Safety protocols were in place, including screening questions prior to the visit, additional usage of staff PPE, and extensive cleaning of exam room while observing appropriate contact time as indicated for disinfecting solutions.   HPI: [name] is a 86 y.o.-year-old female, returning for f/u for DM2, dx in late 1990s, insulin-dependent since 2012, uncontrolled, with complications (CKD stage 4, CHF, + DR) and hypothyroidism. She prev. Saw Dr. Chalmers Cater.  Last visit with me 4 months ago.  She is accompanied by her daughter who offers part of the history including about insulin doses, diet, blood sugars.  She started Noom and Pilates 2x a week, other exercises in the rest of the week.  DM2:  Reviewed HbA1c levels: Lab Results  Component Value Date   HGBA1C 6.8 (H) 07/25/2020   HGBA1C 6.3 (A) 05/17/2020   HGBA1C 7.1 (A) 01/12/2020  12/11/2018: HbA1c 6.9% 08/19/2017: HbA1c calculated from fructosamine is 6.7%  She was on (regimen changed 12/29/2019):   - Lantus 44 units   - Humalog 15 units 3x a day before meals  On 01/12/2020 we changed to:  - Lantus 46 units   - Humalog 13-17 units 3x a day before meals  She checks her sugars more than 4 times a day with her freestyle libre CGM.  Freestyle libre CGM parameters: - Average: 152 >> 164 >> 138 - % active CGM time: 91% >> 97% >> 94% of the time - Glucose variability 23.3  >> 17.1% >> 21.6% (target < or = to 36%) - GMI: 7.2% >> 6.6% - time in range:  - very low (<54): 0% >> 0% >> 0% - low (54-69): 3% >> 0% >> 0% - normal range (70-180): 76% >> 74% >> 92% - high sugars (181-250): 21% >> 25% >> 8% - very high sugars (>250): 0% >> 1% >> 0%    Previously:   Previously:   Lowest sugar was 295 >> .Marland Kitchen.50s >> 54 (Libre) >> 80; she has hypoglycemia awareness at  100. Highest sugar was 559 >> .Marland KitchenMarland Kitchen 400 (steroids) ... >> 200s.  Glucometer: True Metrix air >> freestyle libre CGM  -+ Stage IV CKD, last BUN/creatinine:  Lab Results  Component Value Date   BUN 47 (H) 09/05/2020   BUN 56 (H) 07/25/2020   CREATININE 1.70 (H) 09/05/2020   CREATININE 1.65 (H) 07/25/2020  03/15/2018: 47/2.34, GFR 19, Glu 283  Reviewed her GFR levels: Lab Results  Component Value Date   GFRNONAA 30 (L) 09/05/2020   GFRNONAA 31 (L) 07/25/2020   GFRNONAA 27 (L) 06/13/2020   GFRNONAA 29 (L) 05/03/2020   GFRNONAA 24 (L) 04/02/2020   GFRNONAA 29 (L) 02/19/2020   GFRNONAA 29 (L) 01/25/2020   GFRNONAA 30 (L) 01/04/2020   GFRNONAA 27 (L) 12/15/2019   GFRNONAA 30 (L) 11/22/2019  He has a h/o uric acid kidney stones.  -+ HL; last set of lipids: Lab Results  Component Value Date   CHOL 141 08/14/2020   HDL 27 (L) 08/14/2020   LDLCALC 74 08/14/2020   LDLDIRECT 41.0 05/19/2018   TRIG 334 (H) 08/14/2020   CHOLHDL 5.2 (H) 08/14/2020  On atorvastatin 40, fenofibrate 45, . - last eye exam was in 02/2020: No DR, previously + DR -She has numbness and tingling in her toes  She has a h/o urinary incontinence >> started Myrbetriq >> developed  A fib >> started Eliquis >> had bleeding >> received blood transfusion.  Postsurgical (1960s) and postablative (1977 and 1999) hypothyroidism   Pt is on levothyroxine 112  mcg daily, taken: - in am - fasting - at least 30 min from b'fast - no calcium - no iron - no multivitamins - now off PPIs - not on Biotin  Her TFTs were normal: Lab Results  Component Value Date   TSH 0.544 07/25/2020   TSH 0.62 05/21/2020   TSH 0.355 07/20/2019   TSH 0.838 07/04/2019   TSH 1.64 02/22/2019   TSH 0.92 05/19/2018   TSH 1.82 12/15/2016   TSH 0.87 09/11/2015   TSH 0.715 08/25/2015   TSH 2.19 06/11/2015   She had a mild MI at the beginning of 2019 and she also has CHF.  She does have chronic evaluation for cytopenia and  anemia.  ROS: Constitutional: no weight gain/no weight loss, no fatigue, no subjective hyperthermia, no subjective hypothermia Eyes: no blurry vision, no xerophthalmia ENT: no sore throat, no nodules palpated in neck, no dysphagia, no odynophagia, no hoarseness Cardiovascular: no CP/no SOB/no palpitations/no leg swelling Respiratory: no cough/no SOB/no wheezing Gastrointestinal: no N/no V/no D/no C/no acid reflux Musculoskeletal: no muscle aches/no joint aches Skin: no rashes, no hair loss Neurological: no tremors/+ numbness/+ tingling/no dizziness  I reviewed pt's medications, allergies, PMH, social hx, family hx, and changes were documented in the history of present illness. Otherwise, unchanged from my initial visit note.  Past Medical History:  Diagnosis Date  . Allergic rhinitis   . Anxiety state, unspecified    panic attacks  . CHF (congestive heart failure) ([place])   . Chronic respiratory failure with hypoxia ([place]) 07/03/2019  . Depressive disorder, not elsewhere classified   . Extrinsic asthma, unspecified    no problem since adulthood  . HCAP (healthcare-associated pneumonia)   . Obesity   . OSA on CPAP    severe  . Pneumonitis 07/14/2019  . Pressure injury of skin 07/10/2019  . Pure hypercholesterolemia   . Respiratory failure ([place]) 08/24/2015  . Respiratory failure with hypoxia ([place]) 09/2008   acute, secondary to multiple bilateral pulmonary embolism , negative hypercoagulable workup 09/2008 hospital stay  . Scoliosis   . Type II or unspecified type diabetes mellitus without mention of complication, not stated as uncontrolled   . Unspecified essential hypertension   . Unspecified hypothyroidism    hypo   Past Surgical History:  Procedure Laterality Date  . CARDIOVERSION N/A 06/26/2019   Procedure: CARDIOVERSION;  Surgeon: Jerline Pain, MD;  Location: [facility] ENDOSCOPY;  Service: Cardiovascular;  Laterality: N/A;  . EYE SURGERY Bilateral 2005   cataracts, implants  .  KIDNEY STONE SURGERY Bilateral    2016, 2017   . knee replaced Left 2013  . THYROID SURGERY  1966   nodule removal; partial thyroidectomy; radioactive iodine x2; 1990's   Social History   Social History  . Marital status: Widowed    Spouse name: N/A  . Number of children: 2   Occupational History  . 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 Smoking status: Former Smoker    Years: 20.00    Quit date: 09/07/1989  . Smokeless tobacco: Never Used  . Alcohol use No  . Drug use: No   Current Outpatient Medications on File Prior to Visit  Medication Sig Dispense Refill  . acetaminophen (TYLENOL) 325 MG tablet Take 650 mg by mouth  every 6 (six) hours as needed for moderate pain or fever (for fever or pain/discomfort).     . Amino Acids-Protein Hydrolys (FEEDING SUPPLEMENT, PRO-STAT SUGAR FREE 64,) LIQD Take 30 mLs by mouth 2 (two) times daily. 887 mL 0  . atorvastatin (LIPITOR) 40 MG tablet Take 40 mg by mouth at bedtime.     . BD PEN NEEDLE NANO 2ND GEN 32G X 4 MM MISC USE TO INJECT UP TO FOUR TIMES DAILY AS DIRECTED 200 each 3  . bisacodyl (DULCOLAX) 5 MG EC tablet Take 10 mg by mouth every 3 (three) days as needed (for constipation).     . cephALEXin (KEFLEX) 500 MG capsule Take 1 capsule (500 mg total) by mouth 3 (three) times daily. 30 capsule 0  . Cholecalciferol (VITAMIN D3) 2000 units TABS Take 2,000 Units by mouth daily.    . Continuous Blood Gluc Sensor (FREESTYLE LIBRE 14 DAY SENSOR) MISC Apply new sensor every 14 days. 6 each 3  . fexofenadine (ALLEGRA) 180 MG tablet Take 180 mg by mouth daily.    . furosemide (LASIX) 20 MG tablet TAKE 1 TABLET BY MOUTH FOUR TIMES WEEKLY, ALTERNATING WITH 40MG  THREE TIMES WEEKLY 90 tablet 1  . furosemide (LASIX) 40 MG tablet TAKE 1 TABLET BY MOUTH THREE TIMES WEEKLY, ALTERNATING WITH 20MG  FOUR TIMES WEEKLY 90 tablet 1  . hyoscyamine (LEVSIN) 0.125 MG tablet Take 1 tablet by mouth in the morning  and at bedtime.     . insulin glargine (LANTUS SOLOSTAR) 100 UNIT/ML Solostar Pen Inject 50 Units into the skin daily. 10 pen 3  . insulin lispro (HUMALOG) 100 UNIT/ML KwikPen INJECT 13-20 UNITS UNDER THE SKIN WITH BREAKFAST, LUNCH, AND EVENING MEAL 30 mL 1  . Insulin Syringe-Needle U-100 (INSULIN SYRINGE .3CC/29GX1/2") 29G X 1/2" 0.3 ML MISC Use to inject insulin 4 times a day. 400 each 3  . levothyroxine (SYNTHROID) 112 MCG tablet TAKE 1 TABLET BY MOUTH EVERY DAY 90 tablet 1  . Multiple Vitamin (MULTIVITAMIN) tablet Take 1 tablet by mouth daily.    . potassium citrate (UROCIT-K) 10 MEQ (1080 MG) SR tablet Take 10 mEq by mouth in the morning and at bedtime.     . sertraline (ZOLOFT) 50 MG tablet TAKE 1 TABLET BY MOUTH AT BEDTIME 90 tablet 1  . vitamin C (ASCORBIC ACID) 250 MG tablet Take 250 mg by mouth daily.     No current facility-administered medications on file prior to visit.   Allergies  Allergen Reactions  . Adhesive [Tape] Other (See Comments)    PULLS OFF THE SKIN  . Apixaban Other (See Comments)    Internal Bleeding  . Aspirin Itching, Rash, Hives and Swelling    Swelling of her tongue  . Mirabegron Other (See Comments)    Patient experienced A-Fib  . Pineapple Anaphylaxis and Swelling    Throat swells and blisters on tongue and roof of mouth per patient  . Metformin Diarrhea and Nausea Only  . Tetracycline Hives  . Fluticasone-Salmeterol Itching and Rash  . Iodinated Diagnostic Agents Itching and Rash       . Lactose Intolerance (Gi) Other (See Comments)    Gas   . Latex Itching, Rash and Other (See Comments)    Pulls off the skin and causes welts  . Lisinopril Cough  . Metronidazole Other (See Comments)    Reaction not known  . Sulfa Antibiotics Rash  . Sulfonamide Derivatives Itching and Rash   Family History  Problem Relation Age of  Onset  . Hypertension Father   . Hyperlipidemia Father   . Allergies Mother   . Clotting disorder Mother   . Osteoarthritis  Mother   . Asthma Mother   . Hypertension Mother   . Stroke Mother   . Coronary artery disease Other   . Stroke Other   . Rheum arthritis Maternal Grandmother   . Clotting disorder Maternal Grandmother   . Clotting disorder Maternal Uncle   . Arthritis Daughter   . Diabetes Daughter   . High blood pressure Daughter   . High Cholesterol Daughter   . Depression Maternal Grandfather   . Diabetes Paternal Grandmother   . Breast cancer Neg Hx    PE: BP 128/78   Pulse 90   Ht 5\' 1"  (1.549 m)   Wt 220 lb 6.4 oz (100 kg)   LMP  (LMP Unknown)   SpO2 95%   BMI 41.64 kg/m  Wt Readings from Last 3 Encounters:  09/19/20 220 lb 6.4 oz (100 kg)  09/17/20 221 lb 12.8 oz (100.6 kg)  09/12/20 219 lb 9.6 oz (99.6 kg)   Constitutional: overweight, in NAD Eyes: PERRLA, EOMI, no exophthalmos ENT: moist mucous membranes, no thyromegaly, no cervical lymphadenopathy Cardiovascular: irreg. irreg.RR, No MRG, + BLE edema Respiratory: CTA B Gastrointestinal: abdomen soft, NT, ND, BS+ Musculoskeletal: no deformities, strength intact in all 4 Skin: moist, warm, no rashes Neurological: no tremor with outstretched hands, DTR normal in all 4  ASSESSMENT: 1. DM2, insulin-dependent, uncontrolled, with complications - CKD stage 4 - CHF - + DR  2. Hypothyroidism  3. Hyperlipidemia  PLAN:  1. Patient with longstanding, uncontrolled, type 2 diabetes, on basal-bolus insulin regimen.  Previously on a GLP-1 receptor agonist, but this was held while residing in [facility] facility and she was not eating well.  She actually lost a significant amount of weight in 2019, approximately 40 pounds.  Therefore, we continued without the GLP-1 receptor agonist at last visits.  We increased her insulin doses and HbA1c improved to 6.3% at last visit.  At that time, 70% of the values were in target range, at goal. CGM interpretation: -At today's visit, we reviewed her CGM downloads: It appears that 92% of values are  in target range (goal >70%), while 8% are higher than 180 (goal <25%), and 0% are lower than 70 (goal <4%).  The calculated average blood sugar is 138.  The projected HbA1c for the next 3 months (GMI) is 6.6%. -Reviewing the CGM trends, it appears that the majority of her blood sugars are very well controlled, especially overnight.  Sugars are slightly higher after breakfast, but still within normal range and occasionally higher after lunch, still with the vast majority of the blood sugars lower than 180.  Therefore, her dose of Humalog appears to be appropriate, even though she and her daughter are telling me that she is mostly using the lower end of the recommended range, around 17 units.  Regarding Lantus, she feels that maybe she is taking too much as she feels nervous when the blood sugars drop under 190.  We discussed that especially in the setting of her starting a new diet (Noom) and also exercising more frequently, we can definitely try to decrease the Lantus dose.  I advised him to let me know if she gets any more blood sugars in the 70s or lower, as we can reduce the insulin doses even further. - I suggested to:  Patient Instructions  Please decrease:   -  Lantus to 42 units daily  Please continue:   - Humalog 13-17 units 3x a day before meals  Please continue levothyroxine 112 mcg daily  Take the thyroid hormone every day, with water, at least 30 minutes before breakfast, separated by at least 4 hours from: - acid reflux medications - calcium - iron - multivitamins  Please return in 4 months with your freestyle libre CGM  - we checked her HbA1c: 6.2% (slightly better) - advised to check sugars at different times of the day - 4x a day, rotating check times - advised for yearly eye exams >> she is UTD - return to clinic in 4 months  2. HL -Reviewed latest lipid panel from 08/2020: LDL close to goal, triglycerides high: Lab Results  Component Value Date   CHOL 141 08/14/2020    HDL 27 (L) 08/14/2020   LDLCALC 74 08/14/2020   LDLDIRECT 41.0 05/19/2018   TRIG 334 (H) 08/14/2020   CHOLHDL 5.2 (H) 08/14/2020  -She continues fenofibrate 45 mg daily and atorvastatin 40 mg daily without side effects  3.  Hypothyroidism  - latest thyroid labs reviewed with pt >> normal: Lab Results  Component Value Date   TSH 0.544 07/25/2020   - she continues on LT4 112 mcg daily - pt feels good on this dose. - we discussed about taking the thyroid hormone every day, with water, >30 minutes before breakfast, separated by >4 hours from acid reflux medications, calcium, iron, multivitamins. Pt. is taking it correctly.  In the past, she was taking omeprazole too close to levothyroxine but she stopped since then.  Philemon Kingdom, MD PhD [facility] Endocrinology

## 2020-09-19 NOTE — Patient Instructions (Addendum)
Please decrease:   - Lantus to 42 units daily  Please continue:   - Humalog 13-17 units 3x a day before meals  Please continue levothyroxine 112 mcg daily  Take the thyroid hormone every day, with water, at least 30 minutes before breakfast, separated by at least 4 hours from: - acid reflux medications - calcium - iron - multivitamins  Please return in 4 months with your freestyle libre CGM

## 2020-10-02 ENCOUNTER — Encounter: Payer: Self-pay | Admitting: Family Medicine

## 2020-10-26 ENCOUNTER — Other Ambulatory Visit: Payer: Self-pay | Admitting: Family Medicine

## 2020-10-26 ENCOUNTER — Other Ambulatory Visit: Payer: Self-pay | Admitting: Internal Medicine

## 2020-10-26 DIAGNOSIS — Z760 Encounter for issue of repeat prescription: Secondary | ICD-10-CM

## 2020-10-28 ENCOUNTER — Other Ambulatory Visit: Payer: Self-pay | Admitting: *Deleted

## 2020-10-28 ENCOUNTER — Telehealth: Payer: Self-pay | Admitting: Internal Medicine

## 2020-10-28 DIAGNOSIS — Z760 Encounter for issue of repeat prescription: Secondary | ICD-10-CM

## 2020-10-28 MED ORDER — LEVOTHYROXINE SODIUM 112 MCG PO TABS: 112.0000 ug | ORAL_TABLET | Freq: Every day | ORAL | 1 refills | Status: DC

## 2020-10-28 NOTE — Telephone Encounter (Signed)
Rx sent 

## 2020-10-28 NOTE — Telephone Encounter (Signed)
Patient and Patient's daughter Richardean Sale called to request the following new Rx with refills asap:  MEDICATION: levothyroxine (SYNTHROID) 112 MCG tablet  PHARMACY:   [place], [address] AT [address] Phone:  [phone]  Fax:  [phone]       HAS THE PATIENT CONTACTED [place]?  Yes  IS THIS A 90 DAY SUPPLY :  Yes  IS PATIENT OUT OF MEDICATION: Yes  IF NOT; HOW MUCH IS LEFT: 0  LAST APPOINTMENT DATE: @2 /19/2022  NEXT APPOINTMENT DATE:@5 /13/2022  DO WE HAVE YOUR PERMISSION TO LEAVE A DETAILED MESSAGE?: Yes  OTHER COMMENTS:    **Let patient know to contact pharmacy at the end of the day to make sure medication is ready. **  ** Please notify patient to allow 48-72 hours to process**  **Encourage patient to contact the pharmacy for refills or they can request refills through [facility]**

## 2020-11-06 ENCOUNTER — Ambulatory Visit: Payer: Medicare Other | Admitting: Family Medicine

## 2020-11-07 ENCOUNTER — Inpatient Hospital Stay: Payer: Medicare Other

## 2020-11-07 ENCOUNTER — Inpatient Hospital Stay: Payer: Medicare Other | Attending: Hematology & Oncology

## 2020-11-07 ENCOUNTER — Other Ambulatory Visit: Payer: Self-pay

## 2020-11-07 ENCOUNTER — Inpatient Hospital Stay (HOSPITAL_BASED_OUTPATIENT_CLINIC_OR_DEPARTMENT_OTHER): Payer: Medicare Other | Admitting: Family

## 2020-11-07 ENCOUNTER — Encounter: Payer: Self-pay | Admitting: Family

## 2020-11-07 VITALS — BP 164/58 | HR 78 | Temp 98.6°F | Resp 20 | Wt 219.0 lb

## 2020-11-07 DIAGNOSIS — I4891 Unspecified atrial fibrillation: Secondary | ICD-10-CM | POA: Insufficient documentation

## 2020-11-07 DIAGNOSIS — D649 Anemia, unspecified: Secondary | ICD-10-CM | POA: Diagnosis not present

## 2020-11-07 DIAGNOSIS — D693 Immune thrombocytopenic purpura: Secondary | ICD-10-CM | POA: Diagnosis present

## 2020-11-07 DIAGNOSIS — D500 Iron deficiency anemia secondary to blood loss (chronic): Secondary | ICD-10-CM

## 2020-11-07 DIAGNOSIS — Z86711 Personal history of pulmonary embolism: Secondary | ICD-10-CM | POA: Diagnosis not present

## 2020-11-07 DIAGNOSIS — N184 Chronic kidney disease, stage 4 (severe): Secondary | ICD-10-CM

## 2020-11-07 DIAGNOSIS — G629 Polyneuropathy, unspecified: Secondary | ICD-10-CM | POA: Insufficient documentation

## 2020-11-07 DIAGNOSIS — D509 Iron deficiency anemia, unspecified: Secondary | ICD-10-CM | POA: Insufficient documentation

## 2020-11-07 LAB — CBC WITH DIFFERENTIAL (CANCER CENTER ONLY)
Abs Immature Granulocytes: 0.02 10*3/uL (ref 0.00–0.07)
Basophils Absolute: 0 10*3/uL (ref 0.0–0.1)
Basophils Relative: 0 %
Eosinophils Absolute: 0.2 10*3/uL (ref 0.0–0.5)
Eosinophils Relative: 4 %
HCT: 33.1 % — ABNORMAL LOW (ref 36.0–46.0)
Hemoglobin: 11.1 g/dL — ABNORMAL LOW (ref 12.0–15.0)
Immature Granulocytes: 0 %
Lymphocytes Relative: 20 %
Lymphs Abs: 1 10*3/uL (ref 0.7–4.0)
MCH: 30.9 pg (ref 26.0–34.0)
MCHC: 33.5 g/dL (ref 30.0–36.0)
MCV: 92.2 fL (ref 80.0–100.0)
Monocytes Absolute: 0.4 10*3/uL (ref 0.1–1.0)
Monocytes Relative: 7 %
Neutro Abs: 3.5 10*3/uL (ref 1.7–7.7)
Neutrophils Relative %: 69 %
Platelet Count: 123 10*3/uL — ABNORMAL LOW (ref 150–400)
RBC: 3.59 MIL/uL — ABNORMAL LOW (ref 3.87–5.11)
RDW: 15.7 % — ABNORMAL HIGH (ref 11.5–15.5)
WBC Count: 5.1 10*3/uL (ref 4.0–10.5)
nRBC: 0 % (ref 0.0–0.2)

## 2020-11-07 LAB — CMP (CANCER CENTER ONLY)
ALT: 14 U/L (ref 0–44)
AST: 16 U/L (ref 15–41)
Albumin: 4.3 g/dL (ref 3.5–5.0)
Alkaline Phosphatase: 65 U/L (ref 38–126)
Anion gap: 8 (ref 5–15)
BUN: 40 mg/dL — ABNORMAL HIGH (ref 8–23)
CO2: 32 mmol/L (ref 22–32)
Calcium: 10.1 mg/dL (ref 8.9–10.3)
Chloride: 101 mmol/L (ref 98–111)
Creatinine: 1.69 mg/dL — ABNORMAL HIGH (ref 0.44–1.00)
GFR, Estimated: 30 mL/min — ABNORMAL LOW (ref >60)
Glucose, Bld: 138 mg/dL — ABNORMAL HIGH (ref 70–99)
Potassium: 4.3 mmol/L (ref 3.5–5.1)
Sodium: 141 mmol/L (ref 135–145)
Total Bilirubin: 0.6 mg/dL (ref 0.3–1.2)
Total Protein: 7.2 g/dL (ref 6.5–8.1)

## 2020-11-07 NOTE — Progress Notes (Signed)
Hematology and Oncology Follow Up Visit  [name] [id number] Jan 16, 1935 86 y.o. 11/07/2020   Principle Diagnosis:  Immune based thrombocytopenia History of PE Iron deficiency anemia Atrial fib  Current Therapy: Nplate q3week for platelet count < 100K IV Iron as indicated   Interim History:  [name] is here today with her daughter for follow-up. She continues to do well and is excited to tell us she bought a townhouse!  She is ambulating with a walker for added support.  No falls or syncope. She has really enjoyed piliates and it has made such a positive impact on her mobility and strength.  No episodes of bleeding noted. She does bruise easily but not in excess. No petechiae noted.  She was able to see her cardiologist and was found to be in aflutter at the time. He feels that since she was asymptomatic and doing so well there was no need for intervention.  She denies fever, chills, n/v, cough, rash, SOB, chest pain, palpitations, abdominal pain or changes in bowel or bladder habits.  She has had some fatigue recently and little brain fog. She is concerned she may be developing a UTI but denies pain, urgency or frequency with urination. She has an appointment with PCP tomorrow and plans to do a UA and culture.  She has chronic swelling in her lower extremities and states that it seems a little worse today after having chinese last night for dinner. She will also have hr PCP take a look tomorrow.  Pedal pulses are 2+.  Neuropathy in the two fingers of her left hand is unchanged.  She has maintained a good appetite and is staying well hydrated. She states that her blood glucose has been a little elevated lately so she is working on getting it down. Weight is stable at 219 lbs.   ECOG Performance Status: 1 - Symptomatic but completely ambulatory  Medications:  Allergies as of 11/07/2020      Reactions   Adhesive [tape] Other (See Comments)   PULLS OFF THE  SKIN   Apixaban Other (See Comments)   Internal Bleeding   Aspirin Itching, Rash, Hives, Swelling   Swelling of her tongue   Mirabegron Other (See Comments)   Patient experienced A-Fib   Pineapple Anaphylaxis, Swelling   Throat swells and blisters on tongue and roof of mouth per patient   Metformin Diarrhea, Nausea Only   Tetracycline Hives   Fluticasone-salmeterol Itching, Rash   Iodinated Diagnostic Agents Itching, Rash      Lactose Intolerance (gi) Other (See Comments)   Gas   Latex Itching, Rash, Other (See Comments)   Pulls off the skin and causes welts   Lisinopril Cough   Metronidazole Other (See Comments)   Reaction not known   Sulfa Antibiotics Rash   Sulfonamide Derivatives Itching, Rash      Medication List       Accurate as of November 07, 2020  1:38 PM. If you have any questions, ask your nurse or doctor.        acetaminophen 325 MG tablet Commonly known as: TYLENOL Take 650 mg by mouth every 6 (six) hours as needed for moderate pain or fever (for fever or pain/discomfort).   atorvastatin 40 MG tablet Commonly known as: LIPITOR Take 40 mg by mouth at bedtime.   BD Pen Needle Nano 2nd Gen 32G X 4 MM Misc Generic drug: Insulin Pen Needle USE TO INJECT UP TO FOUR TIMES DAILY AS DIRECTED   bisacodyl 5  MG EC tablet Commonly known as: DULCOLAX Take 10 mg by mouth every 3 (three) days as needed (for constipation).   cephALEXin 500 MG capsule Commonly known as: KEFLEX Take 1 capsule (500 mg total) by mouth 3 (three) times daily.   feeding supplement (PRO-STAT SUGAR FREE 64) Liqd Take 30 mLs by mouth 2 (two) times daily.   fexofenadine 180 MG tablet Commonly known as: ALLEGRA Take 180 mg by mouth daily.   FreeStyle Libre 14 Day Sensor Misc Apply new sensor every 14 days.   furosemide 40 MG tablet Commonly known as: LASIX TAKE 1 TABLET BY MOUTH THREE TIMES WEEKLY, ALTERNATING WITH 20MG  FOUR TIMES WEEKLY   furosemide 20 MG tablet Commonly known as:  LASIX TAKE 1 TABLET BY MOUTH FOUR TIMES WEEKLY, ALTERNATING WITH 40MG  THREE TIMES WEEKLY   hyoscyamine 0.125 MG tablet Commonly known as: LEVSIN Take 1 tablet by mouth in the morning and at bedtime.   insulin lispro 100 UNIT/ML KwikPen Commonly known as: HUMALOG INJECT 13-20 UNITS UNDER THE SKIN WITH BREAKFAST, LUNCH, AND EVENING MEAL   INSULIN SYRINGE .3CC/29GX1/2" 29G X 1/2" 0.3 ML Misc Use to inject insulin 4 times a day.   Lantus SoloStar 100 UNIT/ML Solostar Pen Generic drug: insulin glargine ADMINISTER 50 UNITS UNDER THE SKIN DAILY   levothyroxine 112 MCG tablet Commonly known as: SYNTHROID Take 1 tablet (112 mcg total) by mouth daily.   multivitamin tablet Take 1 tablet by mouth daily.   potassium citrate 10 MEQ (1080 MG) SR tablet Commonly known as: UROCIT-K Take 10 mEq by mouth in the morning and at bedtime.   sertraline 50 MG tablet Commonly known as: ZOLOFT TAKE 1 TABLET BY MOUTH AT BEDTIME   vitamin C 250 MG tablet Commonly known as: ASCORBIC ACID Take 250 mg by mouth daily.   Vitamin D3 50 MCG (2000 UT) Tabs Take 2,000 Units by mouth daily.       Allergies:  Allergies  Allergen Reactions  . Adhesive [Tape] Other (See Comments)    PULLS OFF THE SKIN  . Apixaban Other (See Comments)    Internal Bleeding  . Aspirin Itching, Rash, Hives and Swelling    Swelling of her tongue  . Mirabegron Other (See Comments)    Patient experienced A-Fib  . Pineapple Anaphylaxis and Swelling    Throat swells and blisters on tongue and roof of mouth per patient  . Metformin Diarrhea and Nausea Only  . Tetracycline Hives  . Fluticasone-Salmeterol Itching and Rash  . Iodinated Diagnostic Agents Itching and Rash       . Lactose Intolerance (Gi) Other (See Comments)    Gas   . Latex Itching, Rash and Other (See Comments)    Pulls off the skin and causes welts  . Lisinopril Cough  . Metronidazole Other (See Comments)    Reaction not known  . Sulfa Antibiotics  Rash  . Sulfonamide Derivatives Itching and Rash    Past Medical History, Surgical history, Social history, and Family History were reviewed and updated.  Review of Systems: All other 10 point review of systems is negative.   Physical Exam:  vitals were not taken for this visit.   Wt Readings from Last 3 Encounters:  09/19/20 220 lb 6.4 oz (100 kg)  09/17/20 221 lb 12.8 oz (100.6 kg)  09/12/20 219 lb 9.6 oz (99.6 kg)    Ocular: Sclerae unicteric, pupils equal, round and reactive to light Ear-nose-throat: Oropharynx clear, dentition fair Lymphatic: No cervical or supraclavicular adenopathy Lungs  no rales or rhonchi, good excursion bilaterally Heart regular rate and rhythm, no murmur appreciated Abd soft, nontender, positive bowel sounds MSK no focal spinal tenderness, no joint edema Neuro: non-focal, well-oriented, appropriate affect Breasts: Deferred   Lab Results  Component Value Date   WBC 5.1 11/07/2020   HGB 11.1 (L) 11/07/2020   HCT 33.1 (L) 11/07/2020   MCV 92.2 11/07/2020   PLT 123 (L) 11/07/2020   Lab Results  Component Value Date   FERRITIN 296 09/05/2020   IRON 66 09/05/2020   TIBC 279 09/05/2020   UIBC 213 09/05/2020   IRONPCTSAT 24 09/05/2020   Lab Results  Component Value Date   RETICCTPCT 2.8 07/05/2019   RBC 3.59 (L) 11/07/2020   RETICCTABS 73.3 01/28/2011   No results found for: KPAFRELGTCHN, LAMBDASER, KAPLAMBRATIO No results found for: IGGSERUM, IGA, IGMSERUM No results found for: Odetta Pink, SPEI   Chemistry      Component Value Date/Time   NA 140 09/05/2020 1318   NA 145 09/08/2017 1317   NA 142 02/25/2017 1013   K 4.3 09/05/2020 1318   K 4.5 09/08/2017 1317   K 3.8 02/25/2017 1013   CL 100 09/05/2020 1318   CL 98 09/08/2017 1317   CO2 32 09/05/2020 1318   CO2 32 09/08/2017 1317   CO2 26 02/25/2017 1013   BUN 47 (H) 09/05/2020 1318   BUN 34 (H) 09/08/2017 1317   BUN 20.1  02/25/2017 1013   CREATININE 1.70 (H) 09/05/2020 1318   CREATININE 1.9 (H) 09/08/2017 1317   CREATININE 1.8 (H) 02/25/2017 1013      Component Value Date/Time   CALCIUM 10.1 09/05/2020 1318   CALCIUM 9.3 09/08/2017 1317   CALCIUM 8.7 02/25/2017 1013   ALKPHOS 65 09/05/2020 1318   ALKPHOS 45 09/08/2017 1317   ALKPHOS 65 02/25/2017 1013   AST 13 (L) 09/05/2020 1318   AST 16 02/25/2017 1013   ALT 12 09/05/2020 1318   ALT 26 09/08/2017 1317   ALT 13 02/25/2017 1013   BILITOT 0.8 09/05/2020 1318   BILITOT 0.76 02/25/2017 1013       Impression and Plan: Ms. Dowson is a very pleasant 86yo caucasian female with immune based thrombocytopenia. No Nplate needed this visit. Counts remain stable.  Iron studies are stable. We can replace if needed.  Follow-up in another 2 months.  They will contact our office with any questions or concerns.   Laverna Peace, NP 3/3/20221:38 PM

## 2020-11-08 ENCOUNTER — Other Ambulatory Visit: Payer: Self-pay

## 2020-11-08 ENCOUNTER — Encounter: Payer: Self-pay | Admitting: Family Medicine

## 2020-11-08 ENCOUNTER — Ambulatory Visit (INDEPENDENT_AMBULATORY_CARE_PROVIDER_SITE_OTHER): Payer: Medicare Other | Admitting: Family Medicine

## 2020-11-08 VITALS — BP 130/60 | HR 58 | Temp 98.4°F | Ht 61.0 in | Wt 217.9 lb

## 2020-11-08 DIAGNOSIS — N3000 Acute cystitis without hematuria: Secondary | ICD-10-CM

## 2020-11-08 DIAGNOSIS — N184 Chronic kidney disease, stage 4 (severe): Secondary | ICD-10-CM

## 2020-11-08 DIAGNOSIS — E039 Hypothyroidism, unspecified: Secondary | ICD-10-CM

## 2020-11-08 DIAGNOSIS — G4733 Obstructive sleep apnea (adult) (pediatric): Secondary | ICD-10-CM | POA: Diagnosis not present

## 2020-11-08 DIAGNOSIS — I10 Essential (primary) hypertension: Secondary | ICD-10-CM

## 2020-11-08 DIAGNOSIS — E7800 Pure hypercholesterolemia, unspecified: Secondary | ICD-10-CM

## 2020-11-08 LAB — IRON AND TIBC
Iron: 72 ug/dL (ref 41–142)
Saturation Ratios: 25 % (ref 21–57)
TIBC: 290 ug/dL (ref 236–444)
UIBC: 218 ug/dL (ref 120–384)

## 2020-11-08 LAB — FERRITIN: Ferritin: 301 ng/mL (ref 11–307)

## 2020-11-08 MED ORDER — "XEROFORM PETROLAT PATCH 4""X4"" EX PADS": 1.0000 [IU] | MEDICATED_PAD | Freq: Every day | CUTANEOUS | 5 refills | Status: AC | PRN

## 2020-11-08 NOTE — Progress Notes (Signed)
[name] DOB: 19-Mar-1935 Encounter date: 11/08/2020  This is a 86 y.o. female who presents with Chief Complaint  Patient presents with   Follow-up    History of present illness: Last visit with me was 07/08/20  At that visit, BP was somewhat low; had encouraged her to check outside office. We also referred her to ortho for evaluation of shoulder/arm pain. Did go to ortho care - still with some occasional arm pain. She is doing treatments through them and has noted some improvement. She has a prn follow up. She completed occupational therapy for at least 6 weeks. Has trigger finger right hand.   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   DM2: follows with Der. Gherghe - last visit 05/17/20. Lantus 46 units, humalog 13-17 units TID. States that her blood sugar has been "going crazy"; has been up a lot. States when she has had UTI in past this happens. Does not have urinary infection sx typically when she has infection. She had an antibiotic at home which she started 2 days ago and feels better now. She has done keflex 500mg  TID x 2 days now. Blood sugar seems to already be looking better. Has been up to 320, but thinks she also had more carbs that day. Hasn't had issues with low sugars.   Hypothyroid: managed by Dr. Renne Crigler - levothyroxine 121mcg daily  HL: fenofibrate 54mg  daily, lipitor 40mg  daily  Paroxysmal a fib: managed by cardiology [id number] determined not to be candidate for anticoag or ablation.  Allergies: allegra 180mg  daily   Allergies  Allergen Reactions   Adhesive [Tape] Other (See Comments)    PULLS OFF THE SKIN   Apixaban Other (See Comments)    Internal Bleeding   Aspirin Itching, Rash, Hives and Swelling    Swelling of her tongue   Mirabegron Other (See Comments)    Patient experienced A-Fib   Pineapple Anaphylaxis and Swelling    Throat swells and blisters on tongue and roof of mouth per patient   Metformin Diarrhea and  Nausea Only   Tetracycline Hives   Fluticasone-Salmeterol Itching and Rash   Iodinated Diagnostic Agents Itching and Rash        Lactose Intolerance (Gi) Other (See Comments)    Gas    Latex Itching, Rash and Other (See Comments)    Pulls off the skin and causes welts   Lisinopril Cough   Metronidazole Other (See Comments)    Reaction not known   Sulfa Antibiotics Rash   Sulfonamide Derivatives Itching and Rash   Current Meds  Medication Sig   acetaminophen (TYLENOL) 325 MG tablet Take 650 mg by mouth every 6 (six) hours as needed for moderate pain or fever (for fever or pain/discomfort).    Amino Acids-Protein Hydrolys (FEEDING SUPPLEMENT, PRO-STAT SUGAR FREE 64,) LIQD Take 30 mLs by mouth 2 (two) times daily.   atorvastatin (LIPITOR) 40 MG tablet Take 40 mg by mouth at bedtime.    BD PEN NEEDLE NANO 2ND GEN 32G X 4 MM MISC USE TO INJECT UP TO FOUR TIMES DAILY AS DIRECTED   bisacodyl (DULCOLAX) 5 MG EC tablet Take 10 mg by mouth every 3 (three) days as needed (for constipation).   Cholecalciferol (VITAMIN D3) 2000 units TABS Take 2,000 Units by mouth daily.   Continuous Blood Gluc Sensor (FREESTYLE LIBRE 14 DAY SENSOR) MISC Apply new sensor every 14 days.   fexofenadine (ALLEGRA) 180 MG tablet Take 180 mg by mouth daily.   furosemide (LASIX)  20 MG tablet TAKE 1 TABLET BY MOUTH FOUR TIMES WEEKLY, ALTERNATING WITH 40MG  THREE TIMES WEEKLY   furosemide (LASIX) 40 MG tablet TAKE 1 TABLET BY MOUTH THREE TIMES WEEKLY, ALTERNATING WITH 20MG  FOUR TIMES WEEKLY   hyoscyamine (LEVSIN) 0.125 MG tablet Take 1 tablet by mouth daily.   insulin lispro (HUMALOG) 100 UNIT/ML KwikPen INJECT 13-20 UNITS UNDER THE SKIN WITH BREAKFAST, LUNCH, AND EVENING MEAL   Insulin Syringe-Needle U-100 (INSULIN SYRINGE .3CC/29GX1/2") 29G X 1/2" 0.3 ML MISC Use to inject insulin 4 times a day.   LANTUS SOLOSTAR 100 UNIT/ML Solostar Pen ADMINISTER 50 UNITS UNDER THE SKIN DAILY (Patient taking  differently: 42 Units.)   levothyroxine (SYNTHROID) 112 MCG tablet Take 1 tablet (112 mcg total) by mouth daily.   Multiple Vitamin (MULTIVITAMIN) tablet Take 1 tablet by mouth daily.   potassium citrate (UROCIT-K) 10 MEQ (1080 MG) SR tablet Take 20 mEq by mouth in the morning, at noon, in the evening, and at bedtime.   sertraline (ZOLOFT) 50 MG tablet TAKE 1 TABLET BY MOUTH AT BEDTIME   vitamin C (ASCORBIC ACID) 250 MG tablet Take 250 mg by mouth daily.   [DISCONTINUED] cephALEXin (KEFLEX) 500 MG capsule Take 1 capsule (500 mg total) by mouth 3 (three) times daily.    Review of Systems  Constitutional: Negative for chills, fatigue and fever.  Respiratory: Negative for cough, chest tightness, shortness of breath and wheezing.   Cardiovascular: Negative for chest pain, palpitations and leg swelling.  Genitourinary:       Blood sugars are better since taking antibiotic for suspected UTI    Objective:  BP 130/60 (BP Location: Left Arm, Patient Position: Sitting, Cuff Size: Large)    Pulse (!) 58    Temp 98.4 F (36.9 C) (Oral)    Ht 5\' 1"  (1.549 m)    Wt 217 lb 14.4 oz (98.8 kg)    LMP  (LMP Unknown)    SpO2 97%    BMI 41.17 kg/m   Weight: 217 lb 14.4 oz (98.8 kg)   BP Readings from Last 3 Encounters:  11/08/20 130/60  11/07/20 (!) 164/58  09/19/20 128/78   Wt Readings from Last 3 Encounters:  11/08/20 217 lb 14.4 oz (98.8 kg)  11/07/20 219 lb (99.3 kg)  09/19/20 220 lb 6.4 oz (100 kg)    Physical Exam Constitutional:      General: She is not in acute distress.    Appearance: She is well-developed.  Cardiovascular:     Rate and Rhythm: Normal rate and regular rhythm.     Heart sounds: Normal heart sounds. No murmur heard. No friction rub.  Pulmonary:     Effort: Pulmonary effort is normal. No respiratory distress.     Breath sounds: Normal breath sounds. No wheezing or rales.  Musculoskeletal:     Right lower leg: No edema.     Left lower leg: No edema.   Neurological:     Mental Status: She is alert and oriented to person, place, and time.  Psychiatric:        Behavior: Behavior normal.     Assessment/Plan 1. Acute cystitis without hematuria She has taken abx x 3 days at home. - Urine Culture; Future - Urine Culture  2. Essential hypertension Well controlled without medication.  3. Obstructive sleep apnea syndrome Wears cpap  4. Hypothyroidism, unspecified type Follows with endo; stable on 132mcg synthroid.  5. Chronic kidney disease (CKD) stage G4/A1, severely decreased glomerular filtration rate (GFR) between 15-29  mL/min/1.73 square meter and albuminuria creatinine ratio less than 30 mg/g (HCC) Follows with nephro. Has been stable.  6. HYPERCHOLESTEROLEMIA Continue lipitor 40mg  daily   Return in about 6 months (around 05/11/2021).      Micheline Rough, MD

## 2020-11-09 LAB — URINE CULTURE
MICRO NUMBER:: 11608775
SPECIMEN QUALITY:: ADEQUATE

## 2020-11-12 ENCOUNTER — Encounter: Payer: Self-pay | Admitting: Family Medicine

## 2020-11-26 ENCOUNTER — Telehealth: Payer: Self-pay | Admitting: *Deleted

## 2020-11-26 NOTE — Telephone Encounter (Signed)
[facility] faxed a note stating they are unable to order Xeroform Petro dressing 4X4 pads.  Per PCP, I contacted the pt and informed her it may be best to purchase this from a medical supply store and to let us know if an Rx is needed.

## 2020-12-03 ENCOUNTER — Other Ambulatory Visit: Payer: Self-pay | Admitting: Family Medicine

## 2020-12-04 ENCOUNTER — Ambulatory Visit (INDEPENDENT_AMBULATORY_CARE_PROVIDER_SITE_OTHER): Payer: Medicare Other | Admitting: Pulmonary Disease

## 2020-12-04 ENCOUNTER — Other Ambulatory Visit: Payer: Self-pay

## 2020-12-04 ENCOUNTER — Encounter: Payer: Self-pay | Admitting: Pulmonary Disease

## 2020-12-04 DIAGNOSIS — G4733 Obstructive sleep apnea (adult) (pediatric): Secondary | ICD-10-CM

## 2020-12-04 NOTE — Addendum Note (Signed)
Addended by: Geanie Logan on: 12/04/2020 11:56 AM   Modules accepted: Orders

## 2020-12-04 NOTE — Assessment & Plan Note (Signed)
We had a prolonged discussion about her CPAP resistance and chances of getting a bacterial infection/pneumonia from CPAP.  We also discussed the reason for recent [place] recall and reasons for that. She is willing to proceed with home sleep testing.  She sleeps in a recliner and I think it would be okay to test her with sleeping in her home environment.  If significant OSA, then we will try to convince her to get back on CPAP

## 2020-12-04 NOTE — Assessment & Plan Note (Signed)
No evidence of recurrence or wheezing, does not need bronchodilators. She does have bibasilar crackles which may be residual from her episode of pneumonitis. Chest x-ray 05/2020 reviewed and does not show any evidence of ILD

## 2020-12-04 NOTE — Progress Notes (Signed)
   Subjective:    Patient ID: [name], female    DOB: 1934-09-09, 86 y.o.   MRN: [id number]  HPI  86 yo ex-smoker for FU of  severe obstructive sleep apnea on nocturnal CPAP  Critical illness with pneumonitis, acute hypoxic respiratory failure, prolonged hospitalization 07/2019, was DNR, improved with BiPAP and steroids, required prolonged rehab but now is back on her feet and living independently.  Accompanied by her daughter today, ambulates with a walker. Denies shortness of breath, cough or wheezing.  Reviewed cardiology consultation 09/2020 has permanent atrial fibrillation but felt not to be a candidate for anticoagulation.  Her thrombocytopenia has improved likely due to prolonged dose of steroids that she got.  Sugars are better controlled now that she is off steroids She is convinced that CPAP caused her to get pneumonitis and does not want to get on this again.  We had a prolonged discussion about this issue and cpap recall She reports chronic bipedal edema  Last labs show creatinine 1.6    PMH -  chronic kidney disease, ITP, A. fib and history of PE in 2010  Significant tests/ events reviewed   NPSG in 04/2008 showed severe obstructive sleep apnea with AHI 85/h, TST 121 mins - no REM sleep. Subsequent titration study showed CPAP requirement of 15 cm with small comfort gel mask   Review of Systems neg for any significant sore throat, dysphagia, itching, sneezing, nasal congestion or excess/ purulent secretions, fever, chills, sweats, unintended wt loss, pleuritic or exertional cp, hempoptysis, orthopnea pnd or change in chronic leg swelling. Also denies presyncope, palpitations, heartburn, abdominal pain, nausea, vomiting, diarrhea or change in bowel or urinary habits, dysuria,hematuria, rash, arthralgias, visual complaints, headache, numbness weakness or ataxia.     Objective:   Physical Exam  Gen. Pleasant, obese, in no distress ENT - no lesions, no  post nasal drip Neck: No JVD, no thyromegaly, no carotid bruits Lungs: no use of accessory muscles, no dullness to percussion, bibasal dry rales no rhonchi  Cardiovascular: Rhythm regular, heart sounds  normal, no murmurs or gallops, 2+ peripheral edema Musculoskeletal: No deformities, no cyanosis or clubbing , no tremors       Assessment & Plan:

## 2020-12-04 NOTE — Patient Instructions (Signed)
  Schedule HST

## 2020-12-12 ENCOUNTER — Other Ambulatory Visit: Payer: Self-pay | Admitting: Internal Medicine

## 2021-01-07 ENCOUNTER — Inpatient Hospital Stay: Payer: Medicare Other | Attending: Hematology & Oncology

## 2021-01-07 ENCOUNTER — Other Ambulatory Visit: Payer: Self-pay

## 2021-01-07 ENCOUNTER — Inpatient Hospital Stay: Payer: Medicare Other

## 2021-01-07 ENCOUNTER — Inpatient Hospital Stay (HOSPITAL_BASED_OUTPATIENT_CLINIC_OR_DEPARTMENT_OTHER): Payer: Medicare Other | Admitting: Family

## 2021-01-07 ENCOUNTER — Encounter: Payer: Self-pay | Admitting: Family

## 2021-01-07 VITALS — BP 158/93 | HR 50 | Resp 20 | Ht 61.0 in | Wt 220.0 lb

## 2021-01-07 DIAGNOSIS — R0602 Shortness of breath: Secondary | ICD-10-CM

## 2021-01-07 DIAGNOSIS — M5489 Other dorsalgia: Secondary | ICD-10-CM | POA: Diagnosis not present

## 2021-01-07 DIAGNOSIS — D693 Immune thrombocytopenic purpura: Secondary | ICD-10-CM | POA: Diagnosis present

## 2021-01-07 DIAGNOSIS — Z86711 Personal history of pulmonary embolism: Secondary | ICD-10-CM | POA: Diagnosis not present

## 2021-01-07 DIAGNOSIS — D509 Iron deficiency anemia, unspecified: Secondary | ICD-10-CM | POA: Diagnosis not present

## 2021-01-07 DIAGNOSIS — N184 Chronic kidney disease, stage 4 (severe): Secondary | ICD-10-CM

## 2021-01-07 DIAGNOSIS — Z79899 Other long term (current) drug therapy: Secondary | ICD-10-CM | POA: Insufficient documentation

## 2021-01-07 DIAGNOSIS — D500 Iron deficiency anemia secondary to blood loss (chronic): Secondary | ICD-10-CM

## 2021-01-07 DIAGNOSIS — Z91041 Radiographic dye allergy status: Secondary | ICD-10-CM

## 2021-01-07 DIAGNOSIS — R079 Chest pain, unspecified: Secondary | ICD-10-CM

## 2021-01-07 DIAGNOSIS — D649 Anemia, unspecified: Secondary | ICD-10-CM

## 2021-01-07 LAB — CBC WITH DIFFERENTIAL (CANCER CENTER ONLY)
Abs Immature Granulocytes: 0.02 10*3/uL (ref 0.00–0.07)
Basophils Absolute: 0 10*3/uL (ref 0.0–0.1)
Basophils Relative: 0 %
Eosinophils Absolute: 0.2 10*3/uL (ref 0.0–0.5)
Eosinophils Relative: 3 %
HCT: 34.3 % — ABNORMAL LOW (ref 36.0–46.0)
Hemoglobin: 11.3 g/dL — ABNORMAL LOW (ref 12.0–15.0)
Immature Granulocytes: 0 %
Lymphocytes Relative: 16 %
Lymphs Abs: 1 10*3/uL (ref 0.7–4.0)
MCH: 30.5 pg (ref 26.0–34.0)
MCHC: 32.9 g/dL (ref 30.0–36.0)
MCV: 92.7 fL (ref 80.0–100.0)
Monocytes Absolute: 0.4 10*3/uL (ref 0.1–1.0)
Monocytes Relative: 6 %
Neutro Abs: 4.6 10*3/uL (ref 1.7–7.7)
Neutrophils Relative %: 75 %
Platelet Count: 135 10*3/uL — ABNORMAL LOW (ref 150–400)
RBC: 3.7 MIL/uL — ABNORMAL LOW (ref 3.87–5.11)
RDW: 16.4 % — ABNORMAL HIGH (ref 11.5–15.5)
WBC Count: 6.2 10*3/uL (ref 4.0–10.5)
nRBC: 0 % (ref 0.0–0.2)

## 2021-01-07 LAB — CMP (CANCER CENTER ONLY)
ALT: 10 U/L (ref 0–44)
AST: 13 U/L — ABNORMAL LOW (ref 15–41)
Albumin: 4 g/dL (ref 3.5–5.0)
Alkaline Phosphatase: 72 U/L (ref 38–126)
Anion gap: 9 (ref 5–15)
BUN: 40 mg/dL — ABNORMAL HIGH (ref 8–23)
CO2: 31 mmol/L (ref 22–32)
Calcium: 10.2 mg/dL (ref 8.9–10.3)
Chloride: 101 mmol/L (ref 98–111)
Creatinine: 1.8 mg/dL — ABNORMAL HIGH (ref 0.44–1.00)
GFR, Estimated: 28 mL/min — ABNORMAL LOW (ref >60)
Glucose, Bld: 188 mg/dL — ABNORMAL HIGH (ref 70–99)
Potassium: 4.5 mmol/L (ref 3.5–5.1)
Sodium: 141 mmol/L (ref 135–145)
Total Bilirubin: 0.5 mg/dL (ref 0.3–1.2)
Total Protein: 6.9 g/dL (ref 6.5–8.1)

## 2021-01-07 MED ORDER — DIPHENHYDRAMINE HCL 50 MG PO TABS: 50.0000 mg | ORAL_TABLET | Freq: Once | ORAL | 0 refills | Status: DC

## 2021-01-07 MED ORDER — PREDNISONE 50 MG PO TABS: ORAL_TABLET | ORAL | 0 refills | Status: DC

## 2021-01-07 NOTE — Progress Notes (Signed)
Hematology and Oncology Follow Up Visit  [name] [id number] 1935/05/08 86 y.o. 01/07/2021   Principle Diagnosis:  Immune based thrombocytopenia History of PE Iron deficiency anemia Atrial fib  Current Therapy: Nplate q3week for platelet count < 100K IV Iron as indicated   Interim History:  [name] is here today with her daughter. She is symptomatic with fatigue and SOB with any exertion. She has also noted pain in the middle of her back. She denies feeling distressed at this time and does not appear toxic. VSS.  She took this week off from piliates to rest.  She recently moved into her condo and states that she has not been as active.  WBC count 6.2, Hgb 11.3, MCV 92 and platelets 135.  No fever, chills, n/v, cough, rash, dizziness, chest pain, palpitations, abdominal pain or changes in bowel or bladder habits.  No blood loss. No bruising or petechiae.  No swelling or tenderness in her extremities at this time.  She has stillness and pain in the right middle finger and has had som pain in her right elbow. She follows up with ortho.  No falls or syncope to report. She ambulates with her Rolator for added support.  She states that she has good appetite and is doing her best to sty properly hydrated. She notes that her blood sugars have not been well controlled lately.  Her weight is stable at 222 lbs.   ECOG Performance Status: 2 - Symptomatic, <50% confined to bed  Medications:  Allergies as of 01/07/2021      Reactions   Adhesive [tape] Other (See Comments)   PULLS OFF THE SKIN   Apixaban Other (See Comments)   Internal Bleeding   Aspirin Itching, Rash, Hives, Swelling   Swelling of her tongue   Mirabegron Other (See Comments)   Patient experienced A-Fib   Pineapple Anaphylaxis, Swelling   Throat swells and blisters on tongue and roof of mouth per patient   Metformin Diarrhea, Nausea Only   Tetracycline Hives   Fluticasone-salmeterol Itching,  Rash   Iodinated Diagnostic Agents Itching, Rash      Lactose Intolerance (gi) Other (See Comments)   Gas   Latex Itching, Rash, Other (See Comments)   Pulls off the skin and causes welts   Lisinopril Cough   Metronidazole Other (See Comments)   Reaction not known   Sulfa Antibiotics Rash   Sulfonamide Derivatives Itching, Rash      Medication List       Accurate as of Jan 07, 2021  8:02 PM. If you have any questions, ask your nurse or doctor.        acetaminophen 325 MG tablet Commonly known as: TYLENOL Take 650 mg by mouth every 6 (six) hours as needed for moderate pain or fever (for fever or pain/discomfort).   atorvastatin 40 MG tablet Commonly known as: LIPITOR Take 40 mg by mouth at bedtime.   BD Pen Needle Nano 2nd Gen 32G X 4 MM Misc Generic drug: Insulin Pen Needle USE TO INJECT UP TO FOUR TIMES DAILY AS DIRECTED   bisacodyl 5 MG EC tablet Commonly known as: DULCOLAX Take 10 mg by mouth every 3 (three) days as needed (for constipation).   diphenhydrAMINE 50 MG tablet Commonly known as: BENADRYL Take 1 tablet (50 mg total) by mouth once for 1 dose. Take 1 hour prior to CT scan. Started by: Laverna Peace, NP   feeding supplement (PRO-STAT SUGAR FREE 64) Liqd Take 30 mLs by mouth  2 (two) times daily.   fexofenadine 180 MG tablet Commonly known as: ALLEGRA Take 180 mg by mouth daily.   FreeStyle Libre 14 Day Sensor Misc APPLY NEW SENSOR EVERY 14 DAYS AS DIRECTED   furosemide 40 MG tablet Commonly known as: LASIX TAKE 1 TABLET BY MOUTH THREE TIMES WEEKLY, ALTERNATING WITH 20MG  FOUR TIMES WEEKLY   furosemide 20 MG tablet Commonly known as: LASIX TAKE 1 TABLET BY MOUTH 4 TIMES EVERY WEEK, ALTERNATING WITH 40 MG THREE TIMES EVERY WEEK   HumaLOG KwikPen 100 UNIT/ML KwikPen Generic drug: insulin lispro INJECT 13 TO 20 UNITS UNDER THE SKIN THREE TIMES DAILY WITH BREAKFAST, LUNCH AND EVENING MEAL   hyoscyamine 0.125 MG tablet Commonly known as:  LEVSIN Take 1 tablet by mouth daily.   INSULIN SYRINGE .3CC/29GX1/2" 29G X 1/2" 0.3 ML Misc Use to inject insulin 4 times a day.   Lantus SoloStar 100 UNIT/ML Solostar Pen Generic drug: insulin glargine ADMINISTER 50 UNITS UNDER THE SKIN DAILY What changed: See the new instructions.   levothyroxine 112 MCG tablet Commonly known as: SYNTHROID Take 1 tablet (112 mcg total) by mouth daily.   multivitamin tablet Take 1 tablet by mouth daily.   potassium citrate 10 MEQ (1080 MG) SR tablet Commonly known as: UROCIT-K Take 20 mEq by mouth in the morning, at noon, in the evening, and at bedtime.   predniSONE 50 MG tablet Commonly known as: DELTASONE Take 1 tablet at 13 hours, 7 hours and 1 hour prior to CT scan. Started by: Laverna Peace, NP   sertraline 50 MG tablet Commonly known as: ZOLOFT TAKE 1 TABLET BY MOUTH AT BEDTIME   vitamin C 250 MG tablet Commonly known as: ASCORBIC ACID Take 250 mg by mouth daily.   Vitamin D3 50 MCG (2000 UT) Tabs Take 2,000 Units by mouth daily.   Xeroform Petrolat Patch 4"x4" Pads Apply 1 Units topically daily as needed.       Allergies:  Allergies  Allergen Reactions  . Adhesive [Tape] Other (See Comments)    PULLS OFF THE SKIN  . Apixaban Other (See Comments)    Internal Bleeding  . Aspirin Itching, Rash, Hives and Swelling    Swelling of her tongue  . Mirabegron Other (See Comments)    Patient experienced A-Fib  . Pineapple Anaphylaxis and Swelling    Throat swells and blisters on tongue and roof of mouth per patient  . Metformin Diarrhea and Nausea Only  . Tetracycline Hives  . Fluticasone-Salmeterol Itching and Rash  . Iodinated Diagnostic Agents Itching and Rash       . Lactose Intolerance (Gi) Other (See Comments)    Gas   . Latex Itching, Rash and Other (See Comments)    Pulls off the skin and causes welts  . Lisinopril Cough  . Metronidazole Other (See Comments)    Reaction not known  . Sulfa Antibiotics Rash   . Sulfonamide Derivatives Itching and Rash    Past Medical History, Surgical history, Social history, and Family History were reviewed and updated.  Review of Systems: All other 10 point review of systems is negative.   Physical Exam:  height is 5\' 1"  (1.549 m) and weight is 220 lb (99.8 kg). Her blood pressure is 158/93 (abnormal) and her pulse is 50 (abnormal). Her respiration is 20 and oxygen saturation is 98%.   Wt Readings from Last 3 Encounters:  01/07/21 220 lb (99.8 kg)  12/04/20 222 lb 3.2 oz (100.8 kg)  11/08/20 217 lb  14.4 oz (98.8 kg)    Ocular: Sclerae unicteric, pupils equal, round and reactive to light Ear-nose-throat: Oropharynx clear, dentition fair Lymphatic: No cervical or supraclavicular adenopathy Lungs no rales or rhonchi, good excursion bilaterally Heart regular rate and rhythm, no murmur appreciated Abd soft, nontender, positive bowel sounds MSK no focal spinal tenderness, no joint edema Neuro: non-focal, well-oriented, appropriate affect Breasts: Deferred   Lab Results  Component Value Date   WBC 6.2 01/07/2021   HGB 11.3 (L) 01/07/2021   HCT 34.3 (L) 01/07/2021   MCV 92.7 01/07/2021   PLT 135 (L) 01/07/2021   Lab Results  Component Value Date   FERRITIN 301 11/07/2020   IRON 72 11/07/2020   TIBC 290 11/07/2020   UIBC 218 11/07/2020   IRONPCTSAT 25 11/07/2020   Lab Results  Component Value Date   RETICCTPCT 2.8 07/05/2019   RBC 3.70 (L) 01/07/2021   RETICCTABS 73.3 01/28/2011   No results found for: KPAFRELGTCHN, LAMBDASER, KAPLAMBRATIO No results found for: IGGSERUM, IGA, IGMSERUM No results found for: Kathrynn Ducking, MSPIKE, SPEI   Chemistry      Component Value Date/Time   NA 141 01/07/2021 1318   NA 145 09/08/2017 1317   NA 142 02/25/2017 1013   K 4.5 01/07/2021 1318   K 4.5 09/08/2017 1317   K 3.8 02/25/2017 1013   CL 101 01/07/2021 1318   CL 98 09/08/2017 1317   CO2 31  01/07/2021 1318   CO2 32 09/08/2017 1317   CO2 26 02/25/2017 1013   BUN 40 (H) 01/07/2021 1318   BUN 34 (H) 09/08/2017 1317   BUN 20.1 02/25/2017 1013   CREATININE 1.80 (H) 01/07/2021 1318   CREATININE 1.9 (H) 09/08/2017 1317   CREATININE 1.8 (H) 02/25/2017 1013      Component Value Date/Time   CALCIUM 10.2 01/07/2021 1318   CALCIUM 9.3 09/08/2017 1317   CALCIUM 8.7 02/25/2017 1013   ALKPHOS 72 01/07/2021 1318   ALKPHOS 45 09/08/2017 1317   ALKPHOS 65 02/25/2017 1013   AST 13 (L) 01/07/2021 1318   AST 16 02/25/2017 1013   ALT 10 01/07/2021 1318   ALT 26 09/08/2017 1317   ALT 13 02/25/2017 1013   BILITOT 0.5 01/07/2021 1318   BILITOT 0.76 02/25/2017 1013       Impression and Plan: Ms. Murrill is a very pleasant85yo caucasian female with immune based thrombocytopenia. As mentioned above she is symptomatic with fatigue and SOB with over exertion. She has an allergy to IV contrast. We discussed her going through the ED but she would prefer to pre medicate at home and have her CT angio tomorrow. She will contact EMS in the event of an emergency.  No Nplate needed at this time.  Iron studies are pending.  Follow-up in 2 months.  They were encouraged to contact our office with any questions or concerns.   Laverna Peace, NP 5/3/20228:02 PM

## 2021-01-08 ENCOUNTER — Other Ambulatory Visit: Payer: Self-pay | Admitting: *Deleted

## 2021-01-08 ENCOUNTER — Ambulatory Visit (HOSPITAL_COMMUNITY)
Admission: RE | Admit: 2021-01-08 | Discharge: 2021-01-08 | Disposition: A | Discharge status: Home / Self Care | Payer: Medicare Other | Source: Ambulatory Visit | Attending: Family | Admitting: Family

## 2021-01-08 ENCOUNTER — Inpatient Hospital Stay: Payer: Medicare Other

## 2021-01-08 ENCOUNTER — Telehealth: Payer: Self-pay | Admitting: *Deleted

## 2021-01-08 ENCOUNTER — Ambulatory Visit (HOSPITAL_BASED_OUTPATIENT_CLINIC_OR_DEPARTMENT_OTHER): Admission: RE | Admit: 2021-01-08 | Payer: Medicare Other | Source: Ambulatory Visit

## 2021-01-08 ENCOUNTER — Other Ambulatory Visit: Payer: Self-pay | Admitting: Family

## 2021-01-08 ENCOUNTER — Ambulatory Visit: Payer: Medicare Other

## 2021-01-08 DIAGNOSIS — D693 Immune thrombocytopenic purpura: Secondary | ICD-10-CM

## 2021-01-08 DIAGNOSIS — R300 Dysuria: Secondary | ICD-10-CM

## 2021-01-08 DIAGNOSIS — Z86711 Personal history of pulmonary embolism: Secondary | ICD-10-CM

## 2021-01-08 DIAGNOSIS — Z91041 Radiographic dye allergy status: Secondary | ICD-10-CM

## 2021-01-08 DIAGNOSIS — R0602 Shortness of breath: Secondary | ICD-10-CM

## 2021-01-08 DIAGNOSIS — M5489 Other dorsalgia: Secondary | ICD-10-CM

## 2021-01-08 DIAGNOSIS — R079 Chest pain, unspecified: Secondary | ICD-10-CM | POA: Diagnosis present

## 2021-01-08 LAB — URINALYSIS, COMPLETE (UACMP) WITH MICROSCOPIC
Bilirubin Urine: NEGATIVE
Glucose, UA: >=500 mg/dL — AB
Hgb urine dipstick: NEGATIVE
Ketones, ur: NEGATIVE mg/dL
Leukocytes,Ua: NEGATIVE
Nitrite: NEGATIVE
Protein, ur: >=300 mg/dL — AB
Specific Gravity, Urine: 1.021 (ref 1.005–1.030)
pH: 5 (ref 5.0–8.0)

## 2021-01-08 LAB — IRON AND TIBC
Iron: 61 ug/dL (ref 28–170)
Saturation Ratios: 19 % (ref 10.4–31.8)
TIBC: 329 ug/dL (ref 250–450)
UIBC: 268 ug/dL

## 2021-01-08 LAB — FERRITIN: Ferritin: 211 ng/mL (ref 11–307)

## 2021-01-08 NOTE — Telephone Encounter (Signed)
Pt notified per order of S. [place] NP that she does not have a PE and to f/u with Dr. Marlou Porch.  Pt is appreciative of call and requests that VQ scan and CXR results be faxed to Dr. Marlou Porch and Dr. Elsworth Soho.  Reports faxed per pt.'s request.  Pt is appreciative of call and states that she will contact her cardiologist.

## 2021-01-09 ENCOUNTER — Ambulatory Visit (HOSPITAL_COMMUNITY): Admission: RE | Admit: 2021-01-09 | Payer: Medicare Other | Source: Ambulatory Visit

## 2021-01-10 ENCOUNTER — Other Ambulatory Visit: Payer: Self-pay | Admitting: Family

## 2021-01-10 ENCOUNTER — Telehealth: Payer: Self-pay

## 2021-01-10 DIAGNOSIS — N390 Urinary tract infection, site not specified: Secondary | ICD-10-CM

## 2021-01-10 LAB — URINE CULTURE: Culture: >=100000 — AB

## 2021-01-10 MED ORDER — CIPROFLOXACIN HCL 250 MG PO TABS: 250.0000 mg | ORAL_TABLET | Freq: Two times a day (BID) | ORAL | 0 refills | Status: DC

## 2021-01-10 NOTE — Telephone Encounter (Signed)
Called and informed patient of culture results, she understands to call her urologist or nephrologist and if she has any issues to give [place] a call back.

## 2021-01-10 NOTE — Telephone Encounter (Signed)
-----   Message from Eliezer Bottom, NP sent at 01/10/2021  9:11 AM EDT ----- Culture is back and I have routed to her nephrologist and urologist. She is followed for frequent UTI. She can contact their office to discuss treatment! Thank you!

## 2021-01-10 NOTE — Progress Notes (Signed)
Per Up to date treatment for UTI in patient with creatinine clearance of 37, Cipro 250 mg PO for 3 days sent to her pharmacy. Patient will also follow-up with urology next week once they are back in office.

## 2021-01-16 ENCOUNTER — Ambulatory Visit: Payer: Medicare Other

## 2021-01-16 ENCOUNTER — Telehealth: Payer: Self-pay

## 2021-01-16 NOTE — Progress Notes (Deleted)
Subjective:   Shar Paez is a 86 y.o. female who presents for an Initial Medicare Annual Wellness Visit.  Virtual Visit via Video Note  I connected with Rene Kocher  by a video enabled telemedicine application and verified that I am speaking with the correct person using two identifiers.  Location: Patient: Home Provider: Office Persons participating in the virtual visit: patient, provider   I discussed the limitations of evaluation and management by telemedicine and the availability of in person appointments. The patient expressed understanding and agreed to proceed.     Mickel Baas Joyanne Eddinger,LPN    Review of Systems    n/a       Objective:    There were no vitals filed for this visit. There is no height or weight on file to calculate BMI.  Advanced Directives 01/07/2021 11/07/2020 09/05/2020 06/13/2020 05/03/2020 04/02/2020 03/04/2020  Does Patient Have a Medical Advance Directive? Yes Yes Yes Yes Yes Yes Yes  Type of Paramedic of [place];Living will [place];Living will [place];Living will [place];Living will [place];Living will [place];Living will [place];Living will  Does patient want to make changes to medical advance directive? - - No - Patient declined No - Patient declined No - Patient declined No - Patient declined -  Copy of [place] in Chart? No - copy requested No - copy requested No - copy requested No - copy requested No - copy requested No - copy requested No - copy requested  Would patient like information on creating a medical advance directive? No - Patient declined No - Patient declined No - Patient declined No - Patient declined No - Patient declined No - Patient declined No - Patient declined  Pre-existing out of facility DNR order (yellow form or pink MOST form) - - - - - - -    Current  Medications (verified) Outpatient Encounter Medications as of 01/16/2021  Medication Sig  . acetaminophen (TYLENOL) 325 MG tablet Take 650 mg by mouth every 6 (six) hours as needed for moderate pain or fever (for fever or pain/discomfort).   . Amino Acids-Protein Hydrolys (FEEDING SUPPLEMENT, PRO-STAT SUGAR FREE 64,) LIQD Take 30 mLs by mouth 2 (two) times daily.  Marland Kitchen atorvastatin (LIPITOR) 40 MG tablet Take 40 mg by mouth at bedtime.   . BD PEN NEEDLE NANO 2ND GEN 32G X 4 MM MISC USE TO INJECT UP TO FOUR TIMES DAILY AS DIRECTED  . bisacodyl (DULCOLAX) 5 MG EC tablet Take 10 mg by mouth every 3 (three) days as needed (for constipation).  . Bismuth Tribromoph-Petrolatum (XEROFORM PETROLAT PATCH 4"X4") PADS Apply 1 Units topically daily as needed.  . Cholecalciferol (VITAMIN D3) 2000 units TABS Take 2,000 Units by mouth daily.  . ciprofloxacin (CIPRO) 250 MG tablet Take 1 tablet (250 mg total) by mouth 2 (two) times daily.  . Continuous Blood Gluc Sensor (FREESTYLE LIBRE 14 DAY SENSOR) MISC APPLY NEW SENSOR EVERY 14 DAYS AS DIRECTED  . diphenhydrAMINE (BENADRYL) 50 MG tablet Take 1 tablet (50 mg total) by mouth once for 1 dose. Take 1 hour prior to CT scan.  . fexofenadine (ALLEGRA) 180 MG tablet Take 180 mg by mouth daily.  . furosemide (LASIX) 20 MG tablet TAKE 1 TABLET BY MOUTH 4 TIMES EVERY WEEK, ALTERNATING WITH 40 MG THREE TIMES EVERY WEEK  . furosemide (LASIX) 40 MG tablet TAKE 1 TABLET BY MOUTH THREE TIMES  WEEKLY, ALTERNATING WITH 20MG  FOUR TIMES WEEKLY  . HUMALOG KWIKPEN 100 UNIT/ML KwikPen INJECT 13 TO 20 UNITS UNDER THE SKIN THREE TIMES DAILY WITH BREAKFAST, LUNCH AND EVENING MEAL  . hyoscyamine (LEVSIN) 0.125 MG tablet Take 1 tablet by mouth daily.  . Insulin Syringe-Needle U-100 (INSULIN SYRINGE .3CC/29GX1/2") 29G X 1/2" 0.3 ML MISC Use to inject insulin 4 times a day.  Marland Kitchen LANTUS SOLOSTAR 100 UNIT/ML Solostar Pen ADMINISTER 50 UNITS UNDER THE SKIN DAILY (Patient taking differently: 42  Units.)  . levothyroxine (SYNTHROID) 112 MCG tablet Take 1 tablet (112 mcg total) by mouth daily.  . Multiple Vitamin (MULTIVITAMIN) tablet Take 1 tablet by mouth daily.  . potassium citrate (UROCIT-K) 10 MEQ (1080 MG) SR tablet Take 20 mEq by mouth in the morning, at noon, in the evening, and at bedtime.  . predniSONE (DELTASONE) 50 MG tablet Take 1 tablet at 13 hours, 7 hours and 1 hour prior to CT scan.  . sertraline (ZOLOFT) 50 MG tablet TAKE 1 TABLET BY MOUTH AT BEDTIME  . vitamin C (ASCORBIC ACID) 250 MG tablet Take 250 mg by mouth daily.   No facility-administered encounter medications on file as of 01/16/2021.    Allergies (verified) Adhesive [tape], Apixaban, Aspirin, Mirabegron, Pineapple, Metformin, Tetracycline, Fluticasone-salmeterol, Iodinated diagnostic agents, Lactose intolerance (gi), Latex, Lisinopril, Metronidazole, Sulfa antibiotics, and Sulfonamide derivatives   History: Past Medical History:  Diagnosis Date  . Allergic rhinitis   . Anxiety state, unspecified    panic attacks  . CHF (congestive heart failure) ([place])   . Chronic respiratory failure with hypoxia ([place]) 07/03/2019  . Chronic respiratory failure with hypoxia ([place]) 07/03/2019  . Depressive disorder, not elsewhere classified   . Extrinsic asthma, unspecified    no problem since adulthood  . HCAP (healthcare-associated pneumonia)   . Obesity   . OSA on CPAP    severe  . Pneumonitis 07/14/2019  . Pressure injury of skin 07/10/2019  . Pure hypercholesterolemia   . Respiratory failure ([place]) 08/24/2015  . Respiratory failure with hypoxia ([place]) 09/2008   acute, secondary to multiple bilateral pulmonary embolism , negative hypercoagulable workup 09/2008 hospital stay  . Scoliosis   . Type II or unspecified type diabetes mellitus without mention of complication, not stated as uncontrolled   . Unspecified essential hypertension   . Unspecified hypothyroidism    hypo   Past Surgical History:  Procedure  Laterality Date  . CARDIOVERSION N/A 06/26/2019   Procedure: CARDIOVERSION;  Surgeon: Jerline Pain, MD;  Location: [facility] ENDOSCOPY;  Service: Cardiovascular;  Laterality: N/A;  . EYE SURGERY Bilateral 2005   cataracts, implants  . KIDNEY STONE SURGERY Bilateral    2016, 2017   . knee replaced Left 2013  . THYROID SURGERY  1966   nodule removal; partial thyroidectomy; radioactive iodine x2; 1990's   Family History  Problem Relation Age of Onset  . Hypertension Father   . Hyperlipidemia Father   . Allergies Mother   . Clotting disorder Mother   . Osteoarthritis Mother   . Asthma Mother   . Hypertension Mother   . Stroke Mother   . Coronary artery disease Other   . Stroke Other   . Rheum arthritis Maternal Grandmother   . Clotting disorder Maternal Grandmother   . Clotting disorder Maternal Uncle   . Arthritis Daughter   . Diabetes Daughter   . High blood pressure Daughter   . High Cholesterol Daughter   . Depression Maternal Grandfather   . Diabetes Paternal Grandmother   .  Breast cancer Neg Hx    Social History   Socioeconomic History  . Marital status: Widowed    Spouse name: Not on file  . Number of children: 2  . Years of education: Not on file  . Highest education level: Not on file  Occupational History  . Occupation: OWNER    Employer: [employer]    Comment: Self employed- runs Advertising copywriter  Tobacco Use  . Smoking status: Former Smoker    Packs/day: 0.25    Years: 20.00    Pack years: 5.00    Types: Cigarettes    Quit date: 09/07/1989    Years since quitting: 31.3  . Smokeless tobacco: Never Used  Vaping Use  . Vaping Use: Never used  Substance and Sexual Activity  . Alcohol use: No  . Drug use: No  . Sexual activity: Not Currently  Other Topics Concern  . Not on file  Social History Narrative   Lives at [place] Strain: Not on file  Food Insecurity: Not on file  Transportation Needs:  Not on file  Physical Activity: Not on file  Stress: Not on file  Social Connections: Not on file    Tobacco Counseling Counseling given: Not Answered   Clinical Intake:                 Diabetic?no         Activities of Daily Living No flowsheet data found.  Patient Care Team: Caren Macadam, MD as PCP - General (Family Medicine) Jerline Pain, MD as PCP - Cardiology (Cardiology) Rigoberto Noel, MD as Consulting Physician (Pulmonary Disease) Katy Apo, MD as Consulting Physician (Ophthalmology) Preminger, Adrienne Mocha, MD as Referring Physician (Urology) Rexene Agent, MD as Attending Physician (Nephrology) Philemon Kingdom, MD as Consulting Physician (Internal Medicine) Marin Olp Rudell Cobb, MD as Consulting Physician (Oncology)  Indicate any recent Medical Services you may have received from other than [facility] providers in the past year (date may be approximate).     Assessment:   This is a routine wellness examination for [place].  Hearing/Vision screen No exam data present  Dietary issues and exercise activities discussed:    Goals Addressed   None    Depression Screen PHQ 2/9 Scores 03/23/2017 01/24/2016 04/03/2015  PHQ - 2 Score 1 1 5   PHQ- 9 Score - - 15    Fall Risk Fall Risk  06/16/2017 06/09/2017 06/09/2017 03/23/2017 01/24/2016  Falls in the past year? No No - Yes Yes  Number falls in past yr: - - - 1 2 or more  Injury with Fall? - - - No No  Risk Factor Category  - - - - High Fall Risk  Risk for fall due to : Impaired balance/gait;Impaired mobility Impaired mobility Other (Comment) Impaired balance/gait;Impaired mobility -  Risk for fall due to: Comment - - Instructed to use walker at all times. - -  Follow up - - - Falls prevention discussed;Education provided Falls prevention discussed    FALL RISK PREVENTION PERTAINING TO THE HOME:  Any stairs in or around the home? {YES/NO:21197} If so, are there any without handrails? No  Home  free of loose throw rugs in walkways, pet beds, electrical cords, etc? Yes  Adequate lighting in your home to reduce risk of falls? Yes   ASSISTIVE DEVICES UTILIZED TO PREVENT FALLS:  Life alert? No  Use of a cane, walker or w/c? {YES/NO:21197} Grab bars  in the bathroom? {YES/NO:21197} Shower chair or bench in shower? {YES/NO:21197} Elevated toilet seat or a handicapped toilet? {YES/NO:21197}   Cognitive Function:     Normal cognitive status assessed by direct observation by this Nurse Health Advisor. No abnormalities found.      Immunizations Immunization History  Administered Date(s) Administered  . Fluad Quad(high Dose 65+) 05/27/2020  . Influenza Split 06/09/2011  . Influenza Whole 07/23/2008, 06/13/2009, 06/07/2012  . Influenza, High Dose Seasonal PF 05/08/2015, 05/19/2018  . Moderna Sars-Covid-2 Vaccination 09/18/2019, 10/16/2019, 09/10/2020  . Pneumococcal Conjugate-13 12/15/2016  . Pneumococcal Polysaccharide-23 01/30/2008  . Pneumococcal-Unspecified 09/07/2013  . Td 01/25/2006  . Zoster 06/09/2011    TDAP status: Due, Education has been provided regarding the importance of this vaccine. Advised may receive this vaccine at local pharmacy or Health Dept. Aware to provide a copy of the vaccination record if obtained from local pharmacy or Health Dept. Verbalized acceptance and understanding.  Flu Vaccine status: Up to date  Pneumococcal vaccine status: Up to date  Covid-19 vaccine status: Completed vaccines  Qualifies for Shingles Vaccine? Yes   Zostavax completed Yes   Shingrix Completed?: No.    Education has been provided regarding the importance of this vaccine. Patient has been advised to call insurance company to determine out of pocket expense if they have not yet received this vaccine. Advised may also receive vaccine at local pharmacy or Health Dept. Verbalized acceptance and understanding.  Screening Tests Health Maintenance  Topic Date Due  .  TETANUS/TDAP  01/26/2016  . URINE MICROALBUMIN  09/10/2016  . FOOT EXAM  12/15/2017  . OPHTHALMOLOGY EXAM  02/25/2021  . HEMOGLOBIN A1C  03/19/2021  . INFLUENZA VACCINE  04/07/2021  . DEXA SCAN  Completed  . COVID-19 Vaccine  Completed  . PNA vac Low Risk Adult  Completed  . HPV VACCINES  Aged Out    Health Maintenance  Health Maintenance Due  Topic Date Due  . TETANUS/TDAP  01/26/2016  . URINE MICROALBUMIN  09/10/2016  . FOOT EXAM  12/15/2017    Colorectal cancer screening: No longer required.   Mammogram status: No longer required due to age.  Bone Density status: Completed 0904/2018. Results reflect: Bone density results: NORMAL. Repeat every 5 years.  Lung Cancer Screening: (Low Dose CT Chest recommended if Age 27-80 years, 30 pack-year currently smoking OR have quit w/in 15years.) does not qualify.   Lung Cancer Screening Referral: n/a  Additional Screening:  Hepatitis C Screening: does not qualify  Vision Screening: Recommended annual ophthalmology exams for early detection of glaucoma and other disorders of the eye. Is the patient up to date with their annual eye exam?  {YES/NO:21197} Who is the provider or what is the name of the office in which the patient attends annual eye exams? *** If pt is not established with a provider, would they like to be referred to a provider to establish care? {YES/NO:21197}.   Dental Screening: Recommended annual dental exams for proper oral hygiene  Community Resource Referral / Chronic Care Management: CRR required this visit?  No   CCM required this visit?  No      Plan:     I have personally reviewed and noted the following in the patient's chart:   . Medical and social history . Use of alcohol, tobacco or illicit drugs  . Current medications and supplements including opioid prescriptions. Patient is not currently taking opioid prescriptions. . Functional ability and status . Nutritional status . Physical  activity . Advanced  directives . List of other physicians . Hospitalizations, surgeries, and ER visits in previous 12 months . Vitals . Screenings to include cognitive, depression, and falls . Referrals and appointments  In addition, I have reviewed and discussed with patient certain preventive protocols, quality metrics, and best practice recommendations. A written personalized care plan for preventive services as well as general preventive health recommendations were provided to patient.     Randel Pigg, LPN   7/67/0110   Nurse Notes: none

## 2021-01-16 NOTE — Telephone Encounter (Signed)
Waited on line from 245 till 255 for on line AWV visit scheduled at 245 attempted to call patient again at  248pm and third telephone call  at 250pm and fouth at 255pm with no answer and no voice mail.  Pt needs to reschedule for a in person or telephone visit for next available appointment.  Marvell Fuller, LPN

## 2021-01-17 ENCOUNTER — Encounter: Payer: Self-pay | Admitting: Internal Medicine

## 2021-01-17 ENCOUNTER — Ambulatory Visit (INDEPENDENT_AMBULATORY_CARE_PROVIDER_SITE_OTHER): Payer: Medicare Other | Admitting: Internal Medicine

## 2021-01-17 ENCOUNTER — Other Ambulatory Visit: Payer: Self-pay

## 2021-01-17 VITALS — BP 150/90 | HR 97 | Ht 61.0 in | Wt 222.0 lb

## 2021-01-17 DIAGNOSIS — Z794 Long term (current) use of insulin: Secondary | ICD-10-CM | POA: Diagnosis not present

## 2021-01-17 DIAGNOSIS — N184 Chronic kidney disease, stage 4 (severe): Secondary | ICD-10-CM | POA: Diagnosis not present

## 2021-01-17 DIAGNOSIS — D693 Immune thrombocytopenic purpura: Secondary | ICD-10-CM | POA: Diagnosis not present

## 2021-01-17 DIAGNOSIS — E1122 Type 2 diabetes mellitus with diabetic chronic kidney disease: Secondary | ICD-10-CM | POA: Diagnosis not present

## 2021-01-17 DIAGNOSIS — E890 Postprocedural hypothyroidism: Secondary | ICD-10-CM

## 2021-01-17 DIAGNOSIS — E7800 Pure hypercholesterolemia, unspecified: Secondary | ICD-10-CM | POA: Diagnosis not present

## 2021-01-17 LAB — IBC PANEL
Iron: 60 ug/dL (ref 42–145)
Saturation Ratios: 19 % — ABNORMAL LOW (ref 20.0–50.0)
Transferrin: 225 mg/dL (ref 212.0–360.0)

## 2021-01-17 LAB — POCT GLYCOSYLATED HEMOGLOBIN (HGB A1C): Hemoglobin A1C: 7.1 % — AB (ref 4.0–5.6)

## 2021-01-17 NOTE — Patient Instructions (Addendum)
Please increase: - Lantus 46 units daily  Continue: - Humalog 13-17 units 3x a day before meals  Please continue levothyroxine 112 mcg daily  Take the thyroid hormone every day, with water, at least 30 minutes before breakfast, separated by at least 4 hours from: - acid reflux medications - calcium - iron - multivitamins  Please return in 4 months with your freestyle libre CGM.

## 2021-01-17 NOTE — Progress Notes (Signed)
Patient ID: [name], female   DOB: 1935-08-10, 86 y.o.   MRN: [id number]   This visit occurred during the SARS-CoV-2 public health emergency.  Safety protocols were in place, including screening questions prior to the visit, additional usage of staff PPE, and extensive cleaning of exam room while observing appropriate contact time as indicated for disinfecting solutions.   HPI: [name] is a 86 y.o.-year-old female, returning for f/u for DM2, dx in late 1990s, insulin-dependent since 2012, uncontrolled, with complications (CKD stage 4, CHF, + DR) and hypothyroidism. She prev. Saw Dr. Chalmers [name].  Last visit with me 4 months ago.  She is accompanied by her daughter who offers part of the history including about insulin doses, diet, blood sugars.  Interim history: Before last visit she started on the Noom diet and also started Pilates 2x a week.  She continued these, but recently she was sick: UTI, back pain. She had Prednisone for a CT scan >> sugars higher, up to 500. Now just finished ABx. Sugars are still high. She moved from her daughter's house to her own apartment since last OV - 2 mo ago. She likes int in the new place, she has less pressure from her family.  DM2:  Reviewed HbA1c levels: Lab Results  Component Value Date   HGBA1C 6.2 (A) 09/19/2020   HGBA1C 6.8 (H) 07/25/2020   HGBA1C 6.3 (A) 05/17/2020  12/11/2018: HbA1c 6.9% 08/19/2017: HbA1c calculated from fructosamine is 6.7%  She is on:  - Lantus 46 >> 42 units   - Humalog 13-17 units 3x a day before meals  She checks her sugars more than 4 times a day with her freestyle libre CGM:    Previously:   Previously:   Lowest sugar was 295 >> .Marland Kitchen.50s >> 54 (Libre) >> 80 >> 70s; she has hypoglycemia awareness at 100. Highest sugar was 559 >> .Marland KitchenMarland Kitchen 400 (steroids) ... >> 200s >> 500 (steroids).  Glucometer: True Metrix air >> freestyle libre CGM  -+ Stage IV CKD, last BUN/creatinine:  Lab Results   Component Value Date   BUN 40 (H) 01/07/2021   BUN 40 (H) 11/07/2020   CREATININE 1.80 (H) 01/07/2021   CREATININE 1.69 (H) 11/07/2020  03/15/2018: 47/2.34, GFR 19, Glu 283  Reviewed her GFR levels: Lab Results  Component Value Date   GFRNONAA 28 (L) 01/07/2021   GFRNONAA 30 (L) 11/07/2020   GFRNONAA 30 (L) 09/05/2020   GFRNONAA 31 (L) 07/25/2020   GFRNONAA 27 (L) 06/13/2020   GFRNONAA 29 (L) 05/03/2020   GFRNONAA 24 (L) 04/02/2020   GFRNONAA 29 (L) 02/19/2020   GFRNONAA 29 (L) 01/25/2020   GFRNONAA 30 (L) 01/04/2020  He has a h/o uric acid kidney stones.  -+ HL; last set of lipids: Lab Results  Component Value Date   CHOL 141 08/14/2020   HDL 27 (L) 08/14/2020   LDLCALC 74 08/14/2020   LDLDIRECT 41.0 05/19/2018   TRIG 334 (H) 08/14/2020   CHOLHDL 5.2 (H) 08/14/2020  On atorvastatin 40, fenofibrate 45, . - last eye exam was in 02/2020: No DR, previously + DR -She has numbness and tingling in her toes  She has a h/o urinary incontinence >> started Myrbetriq >> developed A fib >> started Eliquis >> had bleeding >> received blood transfusion.  Postsurgical (1960s) and postablative (1977 and 1999) hypothyroidism   Pt is on levothyroxine 112 mcg daily, taken: - in am - fasting - at least 30 min from b'fast - no calcium - no  iron - no multivitamins - now off PPIs - not on Biotin  Her TFTs were normal: Lab Results  Component Value Date   TSH 0.544 07/25/2020   TSH 0.62 05/21/2020   TSH 0.355 07/20/2019   TSH 0.838 07/04/2019   TSH 1.64 02/22/2019   TSH 0.92 05/19/2018   TSH 1.82 12/15/2016   TSH 0.87 09/11/2015   TSH 0.715 08/25/2015   TSH 2.19 06/11/2015   She had a mild MI at the beginning of 2019 and she also has CHF.  She does have chronic evaluation for cytopenia and anemia.  ROS: Constitutional: no weight gain/no weight loss, no fatigue, no subjective hyperthermia, no subjective hypothermia, + resolved urinary frequency Eyes: no blurry vision, no  xerophthalmia ENT: no sore throat, no nodules palpated in neck, no dysphagia, no odynophagia, no hoarseness Cardiovascular: no CP/no SOB/no palpitations/no leg swelling Respiratory: no cough/no SOB/no wheezing Gastrointestinal: no N/no V/no D/no C/no acid reflux Musculoskeletal: no muscle aches/no joint aches Skin: no rashes, no hair loss Neurological: no tremors/+ numbness/+ tingling/no dizziness  I reviewed pt's medications, allergies, PMH, social hx, family hx, and changes were documented in the history of present illness. Otherwise, unchanged from my initial visit note.  Past Medical History:  Diagnosis Date  . Allergic rhinitis   . Anxiety state, unspecified    panic attacks  . CHF (congestive heart failure) ([place])   . Chronic respiratory failure with hypoxia ([place]) 07/03/2019  . Chronic respiratory failure with hypoxia ([place]) 07/03/2019  . Depressive disorder, not elsewhere classified   . Extrinsic asthma, unspecified    no problem since adulthood  . HCAP (healthcare-associated pneumonia)   . Obesity   . OSA on CPAP    severe  . Pneumonitis 07/14/2019  . Pressure injury of skin 07/10/2019  . Pure hypercholesterolemia   . Respiratory failure ([place]) 08/24/2015  . Respiratory failure with hypoxia ([place]) 09/2008   acute, secondary to multiple bilateral pulmonary embolism , negative hypercoagulable workup 09/2008 hospital stay  . Scoliosis   . Type II or unspecified type diabetes mellitus without mention of complication, not stated as uncontrolled   . Unspecified essential hypertension   . Unspecified hypothyroidism    hypo   Past Surgical History:  Procedure Laterality Date  . CARDIOVERSION N/A 06/26/2019   Procedure: CARDIOVERSION;  Surgeon: Jerline Pain, MD;  Location: [facility] ENDOSCOPY;  Service: Cardiovascular;  Laterality: N/A;  . EYE SURGERY Bilateral 2005   cataracts, implants  . KIDNEY STONE SURGERY Bilateral    2016, 2017   . knee replaced Left 2013  . THYROID SURGERY   1966   nodule removal; partial thyroidectomy; radioactive iodine x2; 1990's   Social History   Social History  . Marital status: Widowed    Spouse name: N/A  . Number of children: 2   Occupational History  . 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 Smoking status: Former Smoker    Years: 20.00    Quit date: 09/07/1989  . Smokeless tobacco: Never Used  . Alcohol use No  . Drug use: No   Current Outpatient Medications on File Prior to Visit  Medication Sig Dispense Refill  . acetaminophen (TYLENOL) 325 MG tablet Take 650 mg by mouth every 6 (six) hours as needed for moderate pain or fever (for fever or pain/discomfort).     . Amino Acids-Protein Hydrolys (FEEDING SUPPLEMENT, PRO-STAT SUGAR FREE 64,) LIQD Take 30 mLs by mouth 2 (  two) times daily. 887 mL 0  . atorvastatin (LIPITOR) 40 MG tablet Take 40 mg by mouth at bedtime.     . BD PEN NEEDLE NANO 2ND GEN 32G X 4 MM MISC USE TO INJECT UP TO FOUR TIMES DAILY AS DIRECTED 200 each 3  . bisacodyl (DULCOLAX) 5 MG EC tablet Take 10 mg by mouth every 3 (three) days as needed (for constipation).    . Bismuth Tribromoph-Petrolatum (XEROFORM PETROLAT PATCH 4"X4") PADS Apply 1 Units topically daily as needed. 75 each 5  . Cholecalciferol (VITAMIN D3) 2000 units TABS Take 2,000 Units by mouth daily.    . ciprofloxacin (CIPRO) 250 MG tablet Take 1 tablet (250 mg total) by mouth 2 (two) times daily. 6 tablet 0  . Continuous Blood Gluc Sensor (FREESTYLE LIBRE 14 DAY SENSOR) MISC APPLY NEW SENSOR EVERY 14 DAYS AS DIRECTED 6 each 3  . diphenhydrAMINE (BENADRYL) 50 MG tablet Take 1 tablet (50 mg total) by mouth once for 1 dose. Take 1 hour prior to CT scan. 1 tablet 0  . fexofenadine (ALLEGRA) 180 MG tablet Take 180 mg by mouth daily.    . furosemide (LASIX) 20 MG tablet TAKE 1 TABLET BY MOUTH 4 TIMES EVERY WEEK, ALTERNATING WITH 40 MG THREE TIMES EVERY WEEK 90 tablet 1  . furosemide (LASIX) 40 MG tablet  TAKE 1 TABLET BY MOUTH THREE TIMES WEEKLY, ALTERNATING WITH 20MG  FOUR TIMES WEEKLY 90 tablet 1  . HUMALOG KWIKPEN 100 UNIT/ML KwikPen INJECT 13 TO 20 UNITS UNDER THE SKIN THREE TIMES DAILY WITH BREAKFAST, LUNCH AND EVENING MEAL 30 mL 1  . hyoscyamine (LEVSIN) 0.125 MG tablet Take 1 tablet by mouth daily.    . Insulin Syringe-Needle U-100 (INSULIN SYRINGE .3CC/29GX1/2") 29G X 1/2" 0.3 ML MISC Use to inject insulin 4 times a day. 400 each 3  . LANTUS SOLOSTAR 100 UNIT/ML Solostar Pen ADMINISTER 50 UNITS UNDER THE SKIN DAILY (Patient taking differently: 42 Units.) 30 mL 3  . levothyroxine (SYNTHROID) 112 MCG tablet Take 1 tablet (112 mcg total) by mouth daily. 90 tablet 1  . Multiple Vitamin (MULTIVITAMIN) tablet Take 1 tablet by mouth daily.    . potassium citrate (UROCIT-K) 10 MEQ (1080 MG) SR tablet Take 20 mEq by mouth in the morning, at noon, in the evening, and at bedtime.    . predniSONE (DELTASONE) 50 MG tablet Take 1 tablet at 13 hours, 7 hours and 1 hour prior to CT scan. 3 tablet 0  . sertraline (ZOLOFT) 50 MG tablet TAKE 1 TABLET BY MOUTH AT BEDTIME 90 tablet 1  . vitamin C (ASCORBIC ACID) 250 MG tablet Take 250 mg by mouth daily.     No current facility-administered medications on file prior to visit.   Allergies  Allergen Reactions  . Adhesive [Tape] Other (See Comments)    PULLS OFF THE SKIN  . Apixaban Other (See Comments)    Internal Bleeding  . Aspirin Itching, Rash, Hives and Swelling    Swelling of her tongue  . Mirabegron Other (See Comments)    Patient experienced A-Fib  . Pineapple Anaphylaxis and Swelling    Throat swells and blisters on tongue and roof of mouth per patient  . Metformin Diarrhea and Nausea Only  . Tetracycline Hives  . Fluticasone-Salmeterol Itching and Rash  . Iodinated Diagnostic Agents Itching and Rash       . Lactose Intolerance (Gi) Other (See Comments)    Gas   . Latex Itching, Rash and Other (See  Comments)    Pulls off the skin and  causes welts  . Lisinopril Cough  . Metronidazole Other (See Comments)    Reaction not known  . Sulfa Antibiotics Rash  . Sulfonamide Derivatives Itching and Rash   Family History  Problem Relation Age of Onset  . Hypertension Father   . Hyperlipidemia Father   . Allergies Mother   . Clotting disorder Mother   . Osteoarthritis Mother   . Asthma Mother   . Hypertension Mother   . Stroke Mother   . Coronary artery disease Other   . Stroke Other   . Rheum arthritis Maternal Grandmother   . Clotting disorder Maternal Grandmother   . Clotting disorder Maternal Uncle   . Arthritis Daughter   . Diabetes Daughter   . High blood pressure Daughter   . High Cholesterol Daughter   . Depression Maternal Grandfather   . Diabetes Paternal Grandmother   . Breast cancer Neg Hx    PE: BP (!) 150/90 (BP Location: Right Arm, Patient Position: Sitting, Cuff Size: Normal)   Pulse 97   Ht 5\' 1"  (1.549 m)   Wt 222 lb (100.7 kg)   LMP  (LMP Unknown)   SpO2 95%   BMI 41.95 kg/m  Wt Readings from Last 3 Encounters:  01/17/21 222 lb (100.7 kg)  01/07/21 220 lb (99.8 kg)  12/04/20 222 lb 3.2 oz (100.8 kg)   Constitutional: overweight, in NAD Eyes: PERRLA, EOMI, no exophthalmos ENT: moist mucous membranes, no thyromegaly, no cervical lymphadenopathy Cardiovascular: irreg. irreg.RR, No MRG, + BLE edema Respiratory: CTA B Gastrointestinal: abdomen soft, NT, ND, BS+ Musculoskeletal: no deformities, strength intact in all 4 Skin: moist, warm, no rashes Neurological: no tremor with outstretched hands, DTR normal in all 4  ASSESSMENT: 1. DM2, insulin-dependent, uncontrolled, with complications - CKD stage 4 - CHF - + DR  2. Hypothyroidism  3. Hyperlipidemia  PLAN:  1. Patient with longstanding, uncontrolled, type 2 diabetes, on basal/bolus insulin regimen.  She was previously on a GLP-1 receptor agonist but this was held while residing in [facility] facility and she was not eating  well.  She lost a significant amount of weight in 2019, approximately 40 pounds.  Therefore, we continued without a GLP 1 receptor agonist at the previous visits.  At last visit, HbA1c was excellent, at 6.2%.  We decreased her dose of Lantus at that time as she was on a new diet (Noom) also exercising more frequently. CGM interpretation: -At today's visit, we reviewed her CGM downloads: It appears that 41% of values are in target range (goal >70%), while 59% are higher than 180 (goal <25%), and 0 are lower than 70 (goal <4%).  The calculated average blood sugar is 198.  The projected HbA1c for the next 3 months (GMI) is 8.0%. -Reviewing the CGM trends, it appears that her blood sugars are all higher than before with higher glu levels after b'fast but improving slightly in the last week. - for now, I suggested to increase Lantus by 4 units but I hope that this is a provisional measure, especially after her UTI resolves completely and she continues exercise and Noom diet. - I suggested to:  Patient Instructions  Please increase: - Lantus 46 units daily  Continue: - Humalog 13-17 units 3x a day before meals  Please continue levothyroxine 112 mcg daily  Take the thyroid hormone every day, with water, at least 30 minutes before breakfast, separated by at least 4 hours from: -  acid reflux medications - calcium - iron - multivitamins  Please return in 4 months with your freestyle libre CGM.   - we checked her HbA1c: 7.1% (higher) - advised to check sugars at different times of the day - 4x a day, rotating check times - advised for yearly eye exams >> she is UTD - return to clinic in 4 months  2. HL -Reviewed latest lipid panel from 08/2020: LDL close to goal, triglycerides high: Lab Results  Component Value Date   CHOL 141 08/14/2020   HDL 27 (L) 08/14/2020   LDLCALC 74 08/14/2020   LDLDIRECT 41.0 05/19/2018   TRIG 334 (H) 08/14/2020   CHOLHDL 5.2 (H) 08/14/2020  -She continues on  fenofibrate 45 mg daily and atorvastatin 40 mg daily without side effects  3.  Hypothyroidism  - latest thyroid labs reviewed with pt >> normal: Lab Results  Component Value Date   TSH 0.544 07/25/2020   - she continues on LT4 112 mcg daily - pt feels good on this dose. - we discussed about taking the thyroid hormone every day, with water, >30 minutes before breakfast, separated by >4 hours from acid reflux medications, calcium, iron, multivitamins. Pt. is taking it correctly. - will recheck TFTs today  Will also check her Iron studies, CBC and CMP per Oncologist's request.  Office Visit on 01/17/2021  Component Date Value Ref Range Status  . WBC 01/17/2021 6.5  3.4 - 10.8 x10E3/uL Final  . RBC 01/17/2021 3.89  3.77 - 5.28 x10E6/uL Final  . Hemoglobin 01/17/2021 11.9  11.1 - 15.9 g/dL Final  . Hematocrit 01/17/2021 35.4  34.0 - 46.6 % Final  . MCV 01/17/2021 91  79 - 97 fL Final  . MCH 01/17/2021 30.6  26.6 - 33.0 pg Final  . MCHC 01/17/2021 33.6  31.5 - 35.7 g/dL Final  . RDW 01/17/2021 16.3* 11.7 - 15.4 % Final  . Platelets 01/17/2021 138* 150 - 450 x10E3/uL Final  . Neutrophils 01/17/2021 70  Not Estab. % Final  . Lymphs 01/17/2021 18  Not Estab. % Final  . Monocytes 01/17/2021 8  Not Estab. % Final  . Eos 01/17/2021 3  Not Estab. % Final  . Basos 01/17/2021 0  Not Estab. % Final  . Neutrophils Absolute 01/17/2021 4.5  1.4 - 7.0 x10E3/uL Final  . Lymphocytes Absolute 01/17/2021 1.2  0.7 - 3.1 x10E3/uL Final  . Monocytes Absolute 01/17/2021 0.5  0.1 - 0.9 x10E3/uL Final  . EOS (ABSOLUTE) 01/17/2021 0.2  0.0 - 0.4 x10E3/uL Final  . Basophils Absolute 01/17/2021 0.0  0.0 - 0.2 x10E3/uL Final  . Immature Granulocytes 01/17/2021 1  Not Estab. % Final  . Immature Grans (Abs) 01/17/2021 0.0  0.0 - 0.1 x10E3/uL Final  . Glucose, Bld 01/17/2021 161* 65 - 99 mg/dL Final   Comment: .            Fasting reference interval . For someone without known diabetes, a glucose value >125  mg/dL indicates that they may have diabetes and this should be confirmed with a follow-up test. .   . BUN 01/17/2021 37* 7 - 25 mg/dL Final  . Creat 01/17/2021 1.98* 0.60 - 0.88 mg/dL Final   Comment: For patients >57 years of age, the reference limit for Creatinine is approximately 13% higher for people identified as African-American. .   . GFR, Est Non African American 01/17/2021 23* > OR = 60 mL/min/1.53m2 Final  . GFR, Est African American 01/17/2021 26* > OR = 60  mL/min/1.75m2 Final  . BUN/Creatinine Ratio 01/17/2021 19  6 - 22 (calc) Final  . Sodium 01/17/2021 140  135 - 146 mmol/L Final  . Potassium 01/17/2021 4.5  3.5 - 5.3 mmol/L Final  . Chloride 01/17/2021 103  98 - 110 mmol/L Final  . CO2 01/17/2021 30  20 - 32 mmol/L Final  . Calcium 01/17/2021 9.6  8.6 - 10.4 mg/dL Final  . Total Protein 01/17/2021 6.6  6.1 - 8.1 g/dL Final  . Albumin 01/17/2021 3.9  3.6 - 5.1 g/dL Final  . Globulin 01/17/2021 2.7  1.9 - 3.7 g/dL (calc) Final  . AG Ratio 01/17/2021 1.4  1.0 - 2.5 (calc) Final  . Total Bilirubin 01/17/2021 0.7  0.2 - 1.2 mg/dL Final  . Alkaline phosphatase (APISO) 01/17/2021 73  37 - 153 U/L Final  . AST 01/17/2021 15  10 - 35 U/L Final  . ALT 01/17/2021 14  6 - 29 U/L Final  . Ferritin 01/17/2021 399* 15 - 150 ng/mL Final  . Iron 01/17/2021 60  42 - 145 ug/dL Final  . Transferrin 01/17/2021 225.0  212.0 - 360.0 mg/dL Final  . Saturation Ratios 01/17/2021 19.0* 20.0 - 50.0 % Final  . TSH 01/17/2021 0.742  0.450 - 4.500 uIU/mL Final  . Free T4 01/17/2021 0.95  0.82 - 1.77 ng/dL Final  . Hemoglobin A1C 01/17/2021 7.1* 4.0 - 5.6 % Final   TFTs are normal. The rest of the labs will be FWD'ed to pt's Oncologist.  Philemon Kingdom, MD PhD [facility]

## 2021-01-18 LAB — COMPLETE METABOLIC PANEL WITH GFR
AG Ratio: 1.4 (calc) (ref 1.0–2.5)
ALT: 14 U/L (ref 6–29)
AST: 15 U/L (ref 10–35)
Albumin: 3.9 g/dL (ref 3.6–5.1)
Alkaline phosphatase (APISO): 73 U/L (ref 37–153)
BUN/Creatinine Ratio: 19 (calc) (ref 6–22)
BUN: 37 mg/dL — ABNORMAL HIGH (ref 7–25)
CO2: 30 mmol/L (ref 20–32)
Calcium: 9.6 mg/dL (ref 8.6–10.4)
Chloride: 103 mmol/L (ref 98–110)
Creat: 1.98 mg/dL — ABNORMAL HIGH (ref 0.60–0.88)
GFR, Est African American: 26 mL/min/{1.73_m2} — ABNORMAL LOW (ref >=60)
GFR, Est Non African American: 23 mL/min/{1.73_m2} — ABNORMAL LOW (ref >=60)
Globulin: 2.7 g/dL (calc) (ref 1.9–3.7)
Glucose, Bld: 161 mg/dL — ABNORMAL HIGH (ref 65–99)
Potassium: 4.5 mmol/L (ref 3.5–5.3)
Sodium: 140 mmol/L (ref 135–146)
Total Bilirubin: 0.7 mg/dL (ref 0.2–1.2)
Total Protein: 6.6 g/dL (ref 6.1–8.1)

## 2021-01-18 LAB — CBC WITH DIFFERENTIAL/PLATELET
Basophils Absolute: 0 10*3/uL (ref 0.0–0.2)
Basos: 0 %
EOS (ABSOLUTE): 0.2 10*3/uL (ref 0.0–0.4)
Eos: 3 %
Hematocrit: 35.4 % (ref 34.0–46.6)
Hemoglobin: 11.9 g/dL (ref 11.1–15.9)
Immature Grans (Abs): 0 10*3/uL (ref 0.0–0.1)
Immature Granulocytes: 1 %
Lymphocytes Absolute: 1.2 10*3/uL (ref 0.7–3.1)
Lymphs: 18 %
MCH: 30.6 pg (ref 26.6–33.0)
MCHC: 33.6 g/dL (ref 31.5–35.7)
MCV: 91 fL (ref 79–97)
Monocytes Absolute: 0.5 10*3/uL (ref 0.1–0.9)
Monocytes: 8 %
Neutrophils Absolute: 4.5 10*3/uL (ref 1.4–7.0)
Neutrophils: 70 %
Platelets: 138 10*3/uL — ABNORMAL LOW (ref 150–450)
RBC: 3.89 x10E6/uL (ref 3.77–5.28)
RDW: 16.3 % — ABNORMAL HIGH (ref 11.7–15.4)
WBC: 6.5 10*3/uL (ref 3.4–10.8)

## 2021-01-18 LAB — TSH: TSH: 0.742 u[IU]/mL (ref 0.450–4.500)

## 2021-01-18 LAB — FERRITIN: Ferritin: 399 ng/mL — ABNORMAL HIGH (ref 15–150)

## 2021-01-18 LAB — T4, FREE: Free T4: 0.95 ng/dL (ref 0.82–1.77)

## 2021-01-20 ENCOUNTER — Encounter: Payer: Self-pay | Admitting: Family

## 2021-01-30 ENCOUNTER — Emergency Department (HOSPITAL_COMMUNITY): Payer: Medicare Other

## 2021-01-30 ENCOUNTER — Inpatient Hospital Stay (HOSPITAL_COMMUNITY)
Admission: EM | Admit: 2021-01-30 | Discharge: 2021-02-01 | DRG: 291 | Disposition: A | Discharge status: Home / Self Care | Payer: Medicare Other | Attending: Family Medicine | Admitting: Family Medicine

## 2021-01-30 ENCOUNTER — Encounter (HOSPITAL_COMMUNITY): Payer: Self-pay | Admitting: *Deleted

## 2021-01-30 DIAGNOSIS — R600 Localized edema: Secondary | ICD-10-CM

## 2021-01-30 DIAGNOSIS — I160 Hypertensive urgency: Secondary | ICD-10-CM

## 2021-01-30 DIAGNOSIS — Z86711 Personal history of pulmonary embolism: Secondary | ICD-10-CM

## 2021-01-30 DIAGNOSIS — I4891 Unspecified atrial fibrillation: Secondary | ICD-10-CM | POA: Diagnosis present

## 2021-01-30 DIAGNOSIS — I484 Atypical atrial flutter: Secondary | ICD-10-CM | POA: Diagnosis present

## 2021-01-30 DIAGNOSIS — J9610 Chronic respiratory failure, unspecified whether with hypoxia or hypercapnia: Secondary | ICD-10-CM | POA: Diagnosis present

## 2021-01-30 DIAGNOSIS — Z91048 Other nonmedicinal substance allergy status: Secondary | ICD-10-CM

## 2021-01-30 DIAGNOSIS — Z882 Allergy status to sulfonamides status: Secondary | ICD-10-CM

## 2021-01-30 DIAGNOSIS — Z8249 Family history of ischemic heart disease and other diseases of the circulatory system: Secondary | ICD-10-CM

## 2021-01-30 DIAGNOSIS — Z886 Allergy status to analgesic agent status: Secondary | ICD-10-CM

## 2021-01-30 DIAGNOSIS — E890 Postprocedural hypothyroidism: Secondary | ICD-10-CM | POA: Diagnosis present

## 2021-01-30 DIAGNOSIS — I5033 Acute on chronic diastolic (congestive) heart failure: Secondary | ICD-10-CM | POA: Diagnosis not present

## 2021-01-30 DIAGNOSIS — Z91041 Radiographic dye allergy status: Secondary | ICD-10-CM

## 2021-01-30 DIAGNOSIS — Z20822 Contact with and (suspected) exposure to covid-19: Secondary | ICD-10-CM | POA: Diagnosis present

## 2021-01-30 DIAGNOSIS — Z91040 Latex allergy status: Secondary | ICD-10-CM

## 2021-01-30 DIAGNOSIS — E1122 Type 2 diabetes mellitus with diabetic chronic kidney disease: Secondary | ICD-10-CM | POA: Diagnosis present

## 2021-01-30 DIAGNOSIS — E7800 Pure hypercholesterolemia, unspecified: Secondary | ICD-10-CM | POA: Diagnosis present

## 2021-01-30 DIAGNOSIS — E8770 Fluid overload, unspecified: Secondary | ICD-10-CM | POA: Diagnosis present

## 2021-01-30 DIAGNOSIS — E119 Type 2 diabetes mellitus without complications: Secondary | ICD-10-CM

## 2021-01-30 DIAGNOSIS — D696 Thrombocytopenia, unspecified: Secondary | ICD-10-CM | POA: Diagnosis present

## 2021-01-30 DIAGNOSIS — Z79890 Hormone replacement therapy (postmenopausal): Secondary | ICD-10-CM

## 2021-01-30 DIAGNOSIS — Z888 Allergy status to other drugs, medicaments and biological substances status: Secondary | ICD-10-CM

## 2021-01-30 DIAGNOSIS — L89312 Pressure ulcer of right buttock, stage 2: Secondary | ICD-10-CM | POA: Diagnosis present

## 2021-01-30 DIAGNOSIS — I130 Hypertensive heart and chronic kidney disease with heart failure and stage 1 through stage 4 chronic kidney disease, or unspecified chronic kidney disease: Secondary | ICD-10-CM | POA: Diagnosis not present

## 2021-01-30 DIAGNOSIS — N184 Chronic kidney disease, stage 4 (severe): Secondary | ICD-10-CM | POA: Diagnosis present

## 2021-01-30 DIAGNOSIS — R0602 Shortness of breath: Secondary | ICD-10-CM | POA: Diagnosis not present

## 2021-01-30 DIAGNOSIS — Z91018 Allergy to other foods: Secondary | ICD-10-CM

## 2021-01-30 DIAGNOSIS — Z79899 Other long term (current) drug therapy: Secondary | ICD-10-CM

## 2021-01-30 DIAGNOSIS — Z66 Do not resuscitate: Secondary | ICD-10-CM | POA: Diagnosis present

## 2021-01-30 DIAGNOSIS — L89322 Pressure ulcer of left buttock, stage 2: Secondary | ICD-10-CM | POA: Diagnosis present

## 2021-01-30 DIAGNOSIS — Z794 Long term (current) use of insulin: Secondary | ICD-10-CM

## 2021-01-30 HISTORY — DX: Hypertensive urgency: I16.0

## 2021-01-30 HISTORY — DX: Other pulmonary embolism without acute cor pulmonale: I26.99

## 2021-01-30 LAB — BASIC METABOLIC PANEL
Anion gap: 9 (ref 5–15)
BUN: 35 mg/dL — ABNORMAL HIGH (ref 8–23)
CO2: 31 mmol/L (ref 22–32)
Calcium: 9.3 mg/dL (ref 8.9–10.3)
Chloride: 101 mmol/L (ref 98–111)
Creatinine, Ser: 1.85 mg/dL — ABNORMAL HIGH (ref 0.44–1.00)
GFR, Estimated: 27 mL/min — ABNORMAL LOW (ref >60)
Glucose, Bld: 187 mg/dL — ABNORMAL HIGH (ref 70–99)
Potassium: 4.4 mmol/L (ref 3.5–5.1)
Sodium: 141 mmol/L (ref 135–145)

## 2021-01-30 LAB — CBC WITH DIFFERENTIAL/PLATELET
Abs Immature Granulocytes: 0.02 10*3/uL (ref 0.00–0.07)
Basophils Absolute: 0 10*3/uL (ref 0.0–0.1)
Basophils Relative: 0 %
Eosinophils Absolute: 0.1 10*3/uL (ref 0.0–0.5)
Eosinophils Relative: 3 %
HCT: 33.8 % — ABNORMAL LOW (ref 36.0–46.0)
Hemoglobin: 11 g/dL — ABNORMAL LOW (ref 12.0–15.0)
Immature Granulocytes: 1 %
Lymphocytes Relative: 17 %
Lymphs Abs: 0.7 10*3/uL (ref 0.7–4.0)
MCH: 30.3 pg (ref 26.0–34.0)
MCHC: 32.5 g/dL (ref 30.0–36.0)
MCV: 93.1 fL (ref 80.0–100.0)
Monocytes Absolute: 0.3 10*3/uL (ref 0.1–1.0)
Monocytes Relative: 6 %
Neutro Abs: 3.1 10*3/uL (ref 1.7–7.7)
Neutrophils Relative %: 73 %
Platelets: 127 10*3/uL — ABNORMAL LOW (ref 150–400)
RBC: 3.63 MIL/uL — ABNORMAL LOW (ref 3.87–5.11)
RDW: 16.5 % — ABNORMAL HIGH (ref 11.5–15.5)
WBC: 4.2 10*3/uL (ref 4.0–10.5)
nRBC: 0 % (ref 0.0–0.2)

## 2021-01-30 LAB — RESP PANEL BY RT-PCR (FLU A&B, COVID) ARPGX2
Influenza A by PCR: NEGATIVE
Influenza B by PCR: NEGATIVE
SARS Coronavirus 2 by RT PCR: NEGATIVE

## 2021-01-30 LAB — BRAIN NATRIURETIC PEPTIDE: B Natriuretic Peptide: 90 pg/mL (ref 0.0–100.0)

## 2021-01-30 LAB — GLUCOSE, CAPILLARY: Glucose-Capillary: 197 mg/dL — ABNORMAL HIGH (ref 70–99)

## 2021-01-30 MED ORDER — INSULIN ASPART 100 UNIT/ML IJ SOLN
0.0000 [IU] | Freq: Three times a day (TID) | INTRAMUSCULAR | Status: DC
  Administered 2021-01-31: 5 [IU] via SUBCUTANEOUS
  Administered 2021-01-31 – 2021-02-01 (×3): 3 [IU] via SUBCUTANEOUS
  Administered 2021-02-01: 5 [IU] via SUBCUTANEOUS
  Filled 2021-01-30: qty 0.15

## 2021-01-30 MED ORDER — HYDRALAZINE HCL 25 MG PO TABS
25.0000 mg | ORAL_TABLET | Freq: Four times a day (QID) | ORAL | Status: DC | PRN
  Administered 2021-01-31: 25 mg via ORAL
  Filled 2021-01-30: qty 1

## 2021-01-30 MED ORDER — ATORVASTATIN CALCIUM 40 MG PO TABS
40.0000 mg | ORAL_TABLET | Freq: Every day | ORAL | Status: DC
  Administered 2021-01-30 – 2021-01-31 (×2): 40 mg via ORAL
  Filled 2021-01-30 (×2): qty 1

## 2021-01-30 MED ORDER — ACETAMINOPHEN 325 MG PO TABS
650.0000 mg | ORAL_TABLET | Freq: Four times a day (QID) | ORAL | Status: DC | PRN
  Administered 2021-01-31: 650 mg via ORAL
  Filled 2021-01-30: qty 2

## 2021-01-30 MED ORDER — INSULIN GLARGINE 100 UNIT/ML ~~LOC~~ SOLN
46.0000 [IU] | Freq: Every day | SUBCUTANEOUS | Status: DC
  Administered 2021-01-30 – 2021-01-31 (×2): 46 [IU] via SUBCUTANEOUS
  Filled 2021-01-30 (×2): qty 0.46

## 2021-01-30 MED ORDER — SODIUM CHLORIDE 0.9 % IV SOLN: 250.0000 mL | INTRAVENOUS | Status: DC | PRN

## 2021-01-30 MED ORDER — SODIUM CHLORIDE 0.9% FLUSH: 3.0000 mL | INTRAVENOUS | Status: DC | PRN

## 2021-01-30 MED ORDER — SERTRALINE HCL 50 MG PO TABS
50.0000 mg | ORAL_TABLET | Freq: Every day | ORAL | Status: DC
  Administered 2021-01-30 – 2021-01-31 (×2): 50 mg via ORAL
  Filled 2021-01-30 (×2): qty 1

## 2021-01-30 MED ORDER — INSULIN ASPART 100 UNIT/ML IJ SOLN
13.0000 [IU] | Freq: Three times a day (TID) | INTRAMUSCULAR | Status: DC
  Administered 2021-01-31 – 2021-02-01 (×5): 13 [IU] via SUBCUTANEOUS

## 2021-01-30 MED ORDER — ACETAMINOPHEN 650 MG RE SUPP: 650.0000 mg | Freq: Four times a day (QID) | RECTAL | Status: DC | PRN

## 2021-01-30 MED ORDER — LEVOTHYROXINE SODIUM 112 MCG PO TABS
112.0000 ug | ORAL_TABLET | Freq: Every day | ORAL | Status: DC
  Administered 2021-01-31 – 2021-02-01 (×2): 112 ug via ORAL
  Filled 2021-01-30 (×2): qty 1

## 2021-01-30 MED ORDER — INSULIN ASPART 100 UNIT/ML IJ SOLN
0.0000 [IU] | Freq: Every day | INTRAMUSCULAR | Status: DC
  Filled 2021-01-30: qty 0.05

## 2021-01-30 MED ORDER — SODIUM CHLORIDE 0.9% FLUSH
3.0000 mL | Freq: Two times a day (BID) | INTRAVENOUS | Status: DC
  Administered 2021-01-30 – 2021-01-31 (×3): 3 mL via INTRAVENOUS

## 2021-01-30 MED ORDER — ACETAMINOPHEN 325 MG PO TABS
650.0000 mg | ORAL_TABLET | Freq: Two times a day (BID) | ORAL | Status: DC
  Administered 2021-01-30 – 2021-02-01 (×4): 650 mg via ORAL
  Filled 2021-01-30 (×4): qty 2

## 2021-01-30 MED ORDER — FUROSEMIDE 10 MG/ML IJ SOLN
INTRAMUSCULAR | Status: AC
  Administered 2021-01-30: 40 mg
  Filled 2021-01-30: qty 4

## 2021-01-30 MED ORDER — FUROSEMIDE 10 MG/ML IJ SOLN
40.0000 mg | Freq: Two times a day (BID) | INTRAMUSCULAR | Status: DC
  Administered 2021-01-31 – 2021-02-01 (×3): 40 mg via INTRAVENOUS
  Filled 2021-01-30 (×3): qty 4

## 2021-01-30 MED ORDER — HYOSCYAMINE SULFATE 0.125 MG PO TABS
0.1250 mg | ORAL_TABLET | Freq: Every day | ORAL | Status: DC
  Administered 2021-01-30: 0.125 mg via ORAL
  Filled 2021-01-30 (×2): qty 1

## 2021-01-30 NOTE — ED Notes (Signed)
Pt spo2 noted to be 94-97 percent while ambulating

## 2021-01-30 NOTE — ED Provider Notes (Signed)
[place] DEPT Provider Note   CSN: [id number] Arrival date & time: 01/30/21  1126     History Chief Complaint  Patient presents with  . Leg Swelling    [name] is a 86 y.o. female.  HPI   Patient with h/o CHF, DMT2, chronic respiratory failure, and CKD presents with bilaterally weeping edema and SOB. States the SOB has been ongoing for weeks and is getting worse. She feels out of breath while speaking. She has to take breaks when exerting herself to catch her breath.  She takes diuretics for CHF and has not missed doses, but she has gained 2 lbs of fluid yesterday. The weeping edema worsened two days ago. She has tried wrapping her legs but not with success. No pus noted. Swelling is even on both sides. States both legs have been cold for the past two days. She denies chest pain. She reports back pain while doing pilates. She has Afib, and is not currently on blood thinners. She is not on home oxygen.    Past Medical History:  Diagnosis Date  . Allergic rhinitis   . Anxiety state, unspecified    panic attacks  . CHF (congestive heart failure) ([place])   . Chronic respiratory failure with hypoxia ([place]) 07/03/2019  . Chronic respiratory failure with hypoxia ([place]) 07/03/2019  . Depressive disorder, not elsewhere classified   . Extrinsic asthma, unspecified    no problem since adulthood  . HCAP (healthcare-associated pneumonia)   . Obesity   . OSA on CPAP    severe  . Pneumonitis 07/14/2019  . Pressure injury of skin 07/10/2019  . Pure hypercholesterolemia   . Respiratory failure ([place]) 08/24/2015  . Respiratory failure with hypoxia ([place]) 09/2008   acute, secondary to multiple bilateral pulmonary embolism , negative hypercoagulable workup 09/2008 hospital stay  . Scoliosis   . Type II or unspecified type diabetes mellitus without mention of complication, not stated as uncontrolled   . Unspecified essential hypertension   . Unspecified  hypothyroidism    hypo    Patient Active Problem List   Diagnosis Date Noted  . Atypical atrial flutter ([place])   . Normocytic anemia 05/18/2017  . Chronic ITP (idiopathic thrombocytopenia) ([facility]) 01/25/2017  . Chronic kidney disease (CKD) stage G4/A1, severely decreased glomerular filtration rate (GFR) between 15-29 mL/min/1.73 square meter and albuminuria creatinine ratio less than 30 mg/g ([facility]) 09/04/2015  . GOUT 06/23/2010  . DERMATITIS 01/16/2010  . BACK PAIN 06/13/2009  . MYALGIA 06/13/2009  . Anxiety state 12/13/2008  . LEG EDEMA, CHRONIC 11/08/2008  . Sleep apnea 10/26/2008  . Hypothyroidism 09/27/2008  . HYPERCHOLESTEROLEMIA 09/27/2008  . OBESITY 09/27/2008  . Depression, recurrent ([place]) 09/27/2008  . Essential hypertension 09/27/2008  . Allergic rhinitis 09/27/2008  . ASTHMA, CHILDHOOD 09/27/2008    Past Surgical History:  Procedure Laterality Date  . CARDIOVERSION N/A 06/26/2019   Procedure: CARDIOVERSION;  Surgeon: Jerline Pain, MD;  Location: [facility] ENDOSCOPY;  Service: Cardiovascular;  Laterality: N/A;  . EYE SURGERY Bilateral 2005   cataracts, implants  . KIDNEY STONE SURGERY Bilateral    2016, 2017   . knee replaced Left 2013  . THYROID SURGERY  1966   nodule removal; partial thyroidectomy; radioactive iodine x2; 1990's     OB History   No obstetric history on file.     Family History  Problem Relation Age of Onset  . Hypertension Father   . Hyperlipidemia Father   . Allergies Mother   .  Clotting disorder Mother   . Osteoarthritis Mother   . Asthma Mother   . Hypertension Mother   . Stroke Mother   . Coronary artery disease Other   . Stroke Other   . Rheum arthritis Maternal Grandmother   . Clotting disorder Maternal Grandmother   . Clotting disorder Maternal Uncle   . Arthritis Daughter   . Diabetes Daughter   . High blood pressure Daughter   . High Cholesterol Daughter   . Depression Maternal Grandfather   . Diabetes Paternal Grandmother    . Breast cancer Neg Hx     Social History   Tobacco Use  . Smoking status: Former Smoker    Packs/day: 0.25    Years: 20.00    Pack years: 5.00    Types: Cigarettes    Quit date: 09/07/1989    Years since quitting: 31.4  . Smokeless tobacco: Never Used  Vaping Use  . Vaping Use: Never used  Substance Use Topics  . Alcohol use: No  . Drug use: No    Home Medications Prior to Admission medications   Medication Sig Start Date End Date Taking? Authorizing Provider  acetaminophen (TYLENOL) 325 MG tablet Take 650 mg by mouth every 6 (six) hours as needed for moderate pain or fever (for fever or pain/discomfort).     [provider]  Amino Acids-Protein Hydrolys (FEEDING SUPPLEMENT, PRO-STAT SUGAR FREE 64,) LIQD Take 30 mLs by mouth 2 (two) times daily. 08/07/19   Debbe Odea, MD  atorvastatin (LIPITOR) 40 MG tablet Take 40 mg by mouth at bedtime.     [provider]  BD PEN NEEDLE NANO 2ND GEN 32G X 4 MM MISC USE TO INJECT UP TO FOUR TIMES DAILY AS DIRECTED 08/08/20   Philemon Kingdom, MD  bisacodyl (DULCOLAX) 5 MG EC tablet Take 10 mg by mouth every 3 (three) days as needed (for constipation).    [provider]  Bismuth Tribromoph-Petrolatum (XEROFORM PETROLAT PATCH 4"X4") PADS Apply 1 Units topically daily as needed. 11/08/20   Caren Macadam, MD  Cholecalciferol (VITAMIN D3) 2000 units TABS Take 2,000 Units by mouth daily.    [provider]  ciprofloxacin (CIPRO) 250 MG tablet Take 1 tablet (250 mg total) by mouth 2 (two) times daily. 01/10/21   [place], Holli Humbles, NP  Continuous Blood Gluc Sensor (FREESTYLE LIBRE 14 DAY SENSOR) MISC APPLY NEW SENSOR EVERY 14 DAYS AS DIRECTED 12/16/20   Philemon Kingdom, MD  diphenhydrAMINE (BENADRYL) 50 MG tablet Take 1 tablet (50 mg total) by mouth once for 1 dose. Take 1 hour prior to CT scan. 01/07/21 01/07/21  [place], Holli Humbles, NP  fexofenadine (ALLEGRA) 180 MG tablet Take 180 mg by mouth daily. 01/04/20    [provider]  furosemide (LASIX) 20 MG tablet TAKE 1 TABLET BY MOUTH 4 TIMES EVERY WEEK, ALTERNATING WITH 40 MG THREE TIMES EVERY WEEK 12/03/20   K[place], Junell C, MD  furosemide (LASIX) 40 MG tablet TAKE 1 TABLET BY MOUTH THREE TIMES WEEKLY, ALTERNATING WITH 20MG  FOUR TIMES WEEKLY 06/11/20   Koberlein, Steele Berg, MD  HUMALOG KWIKPEN 100 UNIT/ML KwikPen INJECT 13 TO 20 UNITS UNDER THE SKIN THREE TIMES DAILY WITH BREAKFAST, LUNCH AND EVENING MEAL 12/16/20   Philemon Kingdom, MD  hyoscyamine (LEVSIN) 0.125 MG tablet Take 1 tablet by mouth daily. 01/06/20   [provider]  Insulin Syringe-Needle U-100 (INSULIN SYRINGE .3CC/29GX1/2") 29G X 1/2" 0.3 ML MISC Use to inject insulin 4 times a day. 01/02/20  Philemon Kingdom, MD  LANTUS SOLOSTAR 100 UNIT/ML Solostar Pen ADMINISTER 50 UNITS UNDER THE SKIN DAILY Patient taking differently: 42 Units. 10/28/20   Philemon Kingdom, MD  levothyroxine (SYNTHROID) 112 MCG tablet Take 1 tablet (112 mcg total) by mouth daily. 10/28/20   Philemon Kingdom, MD  Multiple Vitamin (MULTIVITAMIN) tablet Take 1 tablet by mouth daily.    [provider]  potassium citrate (UROCIT-K) 10 MEQ (1080 MG) SR tablet Take 20 mEq by mouth in the morning, at noon, in the evening, and at bedtime. 05/01/20 05/01/21  [provider]  predniSONE (DELTASONE) 50 MG tablet Take 1 tablet at 13 hours, 7 hours and 1 hour prior to CT scan. 01/07/21   [place], Holli Humbles, NP  sertraline (ZOLOFT) 50 MG tablet TAKE 1 TABLET BY MOUTH AT BEDTIME 05/14/20   [place], Steele Berg, MD  vitamin C (ASCORBIC ACID) 250 MG tablet Take 250 mg by mouth daily.    [provider]    Allergies    Adhesive [tape], Apixaban, Aspirin, Mirabegron, Pineapple, Metformin, Tetracycline, Fluticasone-salmeterol, Iodinated diagnostic agents, Lactose intolerance (gi), Latex, Lisinopril, Metronidazole, Sulfa antibiotics, and Sulfonamide derivatives  Review of Systems   Review of Systems   Constitutional: Positive for unexpected weight change. Negative for chills and fever.  HENT: Negative for ear pain and sore throat.   Eyes: Negative for pain and visual disturbance.  Respiratory: Positive for shortness of breath. Negative for cough.   Cardiovascular: Positive for leg swelling. Negative for chest pain and palpitations.  Gastrointestinal: Negative for abdominal pain and vomiting.  Genitourinary: Negative for dysuria and hematuria.  Musculoskeletal: Positive for back pain. Negative for arthralgias.  Skin: Positive for color change. Negative for rash.  Neurological: Negative for seizures and syncope.  All other systems reviewed and are negative.   Physical Exam Updated Vital Signs BP (!) 187/87 (BP Location: Right Arm)   Pulse 92   Temp 98.2 F (36.8 C) (Oral)   Resp 16   LMP  (LMP Unknown)   SpO2 97%   Physical Exam Vitals and nursing note reviewed. Exam conducted with a chaperone present.  Constitutional:      Appearance: Normal appearance.  HENT:     Head: Normocephalic and atraumatic.  Eyes:     General: No scleral icterus.       Right eye: No discharge.        Left eye: No discharge.     Extraocular Movements: Extraocular movements intact.     Pupils: Pupils are equal, round, and reactive to light.  Cardiovascular:     Rate and Rhythm: Tachycardia present. Rhythm irregular.     Pulses: Normal pulses.     Heart sounds: Normal heart sounds. No murmur heard. No friction rub. No gallop.   Pulmonary:     Effort: Pulmonary effort is normal. No respiratory distress.     Breath sounds: Rales present.     Comments: Patient able to speak in complete sentences, but is easily winded and sounds SOB while speaking. Lower lobe rales bilaterally. No wheezes.  Abdominal:     General: Abdomen is flat. Bowel sounds are normal. There is no distension.     Palpations: Abdomen is soft.     Tenderness: There is no abdominal tenderness.  Skin:    General: Skin is warm and  dry.     Coloration: Skin is not jaundiced.  Neurological:     Mental Status: She is alert. Mental status is at baseline.     Coordination:  Coordination normal.  Psychiatric:        Mood and Affect: Mood normal.     ED Results / Procedures / Treatments   Labs (all labs ordered are listed, but only abnormal results are displayed) Labs Reviewed  BASIC METABOLIC PANEL - Abnormal; Notable for the following components:      Result Value   Glucose, Bld 187 (*)    BUN 35 (*)    Creatinine, Ser 1.85 (*)    GFR, Estimated 27 (*)    All other components within normal limits  CBC WITH DIFFERENTIAL/PLATELET - Abnormal; Notable for the following components:   RBC 3.63 (*)    Hemoglobin 11.0 (*)    HCT 33.8 (*)    RDW 16.5 (*)    Platelets 127 (*)    All other components within normal limits  BRAIN NATRIURETIC PEPTIDE    EKG EKG Interpretation  Date/Time:  Thursday Jan 30 2021 12:37:53 EDT Ventricular Rate:  95 PR Interval:    QRS Duration: 104 QT Interval:  395 QTC Calculation: 523 R Axis:   74 Text Interpretation: Atrial flutter Low voltage, extremity and precordial leads Consider anterior infarct Nonspecific T abnormalities, lateral leads Minimal ST elevation, inferior leads Prolonged QT interval Since last tracing QT has lengthened Otherwise no significant change Confirmed by Daleen Bo [phone]) on 01/30/2021 1:57:56 PM   Radiology DG Chest 2 View  Result Date: 01/30/2021 CLINICAL DATA:  Shortness of breath. EXAM: CHEST - 2 VIEW COMPARISON:  01/08/2021. FINDINGS: Cardiomegaly again noted. Diffuse bilateral interstitial prominence consistent with interstitial edema and or pneumonitis. Tiny bilateral pleural effusions. No pneumothorax. IMPRESSION: 1.  Cardiomegaly again noted. 2. Diffuse bilateral interstitial prominence consistent with interstitial edema and or pneumonitis. Small bilateral pleural effusions. Electronically Signed   By: Marcello Moores  Register   On: 01/30/2021 13:00     Procedures Procedures   Medications Ordered in ED Medications - No data to display  ED Course  I have reviewed the triage vital signs and the nursing notes.  Pertinent labs & imaging results that were available during my care of the patient were reviewed by me and considered in my medical decision making (see chart for details).  Clinical Course as of 01/30/21 1628  Thu Jan 30, 2021  1625 Creatinine(!): 1.85 CK high but unchanged from baseline.  [HS]  1625 B Natriuretic Peptide: 90.0 [HS]    Clinical Course User Index [HS] Sherrill Raring, PA-C   MDM Rules/Calculators/A&P                         Patient presents for leg swelling and SOB. Leg swelling is a chronic concern. She does have 2+ pitting edema to the knee but this is chronic finding. I'm concerned for heart failure exacerbation - labs and imaging ordered.  CXR shows pneumonitis and cardiomegaly unchanged from prior imaging. Her BNP is 90, which is reassuring and does not fit with CHF exacerbation. CK is 1.85, but at baseline with her CKD. No electrolyte derangement..   Patient has had high BP while in the ED. I'm concerned for hypertensive urgency. Her cardiologist stopped her at home BP meds per the patient because her BP was dropping.   Will walk patient and reassess her O2 saturation.   Patient was 94-97% ambulatory.   Spoke with cardiology (Dr. Gayla Doss) who agrees patient needs to be admitted for hypertensive urgency.   Spoke with Dr. Sarajane Jews and he is admitting the patient.  Discussed  HPI, physical exam and plan of care for this patient with attending Fredia Sorrow. The attending physician evaluated this patient as part of a shared visit and agrees with plan of care.  Final Clinical Impression(s) / ED Diagnoses Final diagnoses:  None    Rx / DC Orders ED Discharge Orders    None       Sherrill Raring, PA-C 01/30/21 1753    Fredia Sorrow, MD 02/01/21 [phone]

## 2021-01-30 NOTE — ED Triage Notes (Signed)
Pt with hx of CHF reports bilateral weeping edema for the past ocuple days. She has tried wrapping them but does not know how to do it properly.

## 2021-01-30 NOTE — Progress Notes (Signed)
Scabs on sacrum , unopened . stage 1 bilateral upper gluts

## 2021-01-30 NOTE — ED Notes (Signed)
Called to get a purple man from the floor

## 2021-01-30 NOTE — ED Notes (Signed)
ED TO INPATIENT HANDOFF REPORT  Name/Age/Gender [name] 86 y.o. female  Code Status Code Status History    Date Active Date Inactive Code Status Order ID Comments User Context   07/19/2019 2130 08/07/2019 1849 DNR [id number]  Toy Baker, MD Inpatient   07/05/2019 1951 07/19/2019 0014 Partial Code [id number]  Kathi Ludwig, MD ED   07/03/2019 2358 07/05/2019 1951 Partial Code [id number]  Welford Roche, MD ED   07/03/2019 2112 07/03/2019 2358 DNR [id number]  Welford Roche, MD ED   08/24/2015 2143 08/29/2015 1750 Full Code [id number]  Toy Baker, MD Inpatient   Advance Care Planning Activity    Questions for Most Recent Historical Code Status (Order [id number])    Question Answer   In the event of cardiac or respiratory ARREST Do not call a "code blue"   In the event of cardiac or respiratory ARREST Do not perform Intubation, CPR, defibrillation or ACLS   In the event of cardiac or respiratory ARREST Use medication by any route, position, wound care, and other measures to relive pain and suffering. May use oxygen, suction and manual treatment of airway obstruction as needed for comfort.        Advance Directive Documentation   Flowsheet Row Most Recent Value  Type of Advance Directive Healthcare Power of Attorney, Living will  Pre-existing out of facility DNR order (yellow form or pink MOST form) --  "MOST" Form in Place? --      Home/SNF/Other Home  Chief Complaint Hypertensive urgency [I16.0]  Level of Care/Admitting Diagnosis ED Disposition    ED Disposition Condition [place]: [place] [100102]  Level of Care: Progressive [102]  Admit to Progressive based on following criteria: CARDIOVASCULAR & THORACIC of moderate stability with acute coronary syndrome symptoms/low risk myocardial infarction/hypertensive urgency/arrhythmias/heart failure potentially compromising stability and  stable post cardiovascular intervention patients.  Covid Evaluation: Asymptomatic Screening Protocol (No Symptoms)  Diagnosis: Hypertensive urgency [706237]  Admitting Physician: [place], [place]  Attending Physician: Samuella Cota [[id number]]       Medical History Past Medical History:  Diagnosis Date  . Allergic rhinitis   . Anxiety state, unspecified    panic attacks  . CHF (congestive heart failure) ([place])   . Depressive disorder, not elsewhere classified   . Extrinsic asthma, unspecified    no problem since adulthood  . Obesity   . OSA on CPAP    severe  . Pneumonitis 07/14/2019  . Pressure injury of skin 07/10/2019  . Pulmonary embolism ([place])   . Pure hypercholesterolemia   . Respiratory failure with hypoxia ([place]) 09/2008   acute, secondary to multiple bilateral pulmonary embolism , negative hypercoagulable workup 09/2008 hospital stay  . Scoliosis   . Type II or unspecified type diabetes mellitus without mention of complication, not stated as uncontrolled   . Unspecified hypothyroidism    hypo    Allergies Allergies  Allergen Reactions  . Adhesive [Tape] Other (See Comments)    PULLS OFF THE SKIN  . Apixaban Other (See Comments)    Internal Bleeding  . Aspirin Itching, Rash, Hives and Swelling    Swelling of her tongue  . Mirabegron Other (See Comments)    Patient experienced A-Fib  . Pineapple Anaphylaxis and Swelling    Throat swells and blisters on tongue and roof of mouth per patient  . Metformin Diarrhea and Nausea Only  . Tetracycline Hives  . Fluticasone-Salmeterol Itching and Rash  . Iodinated Diagnostic Agents  Itching and Rash       . Lactose Intolerance (Gi) Other (See Comments)    Gas   . Latex Itching, Rash and Other (See Comments)    Pulls off the skin and causes welts  . Lisinopril Cough  . Metronidazole Other (See Comments)    Reaction not known  . Sulfa Antibiotics Rash  . Sulfonamide Derivatives Itching and Rash    IV  Location/Drains/Wounds Patient Lines/Drains/Airways Status    Active Line/Drains/Airways    Name Placement date Placement time Site Days   Peripheral IV 09/28/19 Left Antecubital 09/28/19  0619  Antecubital  490   Peripheral IV 01/30/21 20 G Left Forearm 01/30/21  1732  Forearm  less than 1   Urethral Catheter [facility] 08/02/19  0545  --  547   Pressure Injury 07/08/19 Buttocks Right;Left;Medial;Lower Stage II -  Partial thickness loss of dermis presenting as a shallow open ulcer with a red, pink wound bed without slough. Red, nonblanchable, 07/08/19  1000  -- 572   Wound / Incision (Open or Dehisced) 07/28/19 Other (Comment) Vagina Medial shallow wound with loss of dermis,  red wound bed. 07/28/19  1400  Vagina  552          Labs/Imaging Results for orders placed or performed during the hospital encounter of 01/30/21 (from the past 48 hour(s))  Basic metabolic panel     Status: Abnormal   Collection Time: 01/30/21  1:16 PM  Result Value Ref Range   Sodium 141 135 - 145 mmol/L   Potassium 4.4 3.5 - 5.1 mmol/L   Chloride 101 98 - 111 mmol/L   CO2 31 22 - 32 mmol/L   Glucose, Bld 187 (H) 70 - 99 mg/dL    Comment: Glucose reference range applies only to samples taken after fasting for at least 8 hours.   BUN 35 (H) 8 - 23 mg/dL   Creatinine, Ser 1.85 (H) 0.44 - 1.00 mg/dL   Calcium 9.3 8.9 - 10.3 mg/dL   GFR, Estimated 27 (L) >60 mL/min    Comment: (NOTE) Calculated using the CKD-EPI Creatinine Equation (2021)    Anion gap 9 5 - 15    Comment: Performed at [facility], [place] [address], [place], [place] [address]  Brain natriuretic peptide     Status: None   Collection Time: 01/30/21  1:16 PM  Result Value Ref Range   B Natriuretic Peptide 90.0 0.0 - 100.0 pg/mL    Comment: Performed at [facility], [place] [address], [place], [place] [address]  CBC with Differential     Status: Abnormal   Collection Time: 01/30/21  1:16 PM  Result Value Ref  Range   WBC 4.2 4.0 - 10.5 K/uL   RBC 3.63 (L) 3.87 - 5.11 MIL/uL   Hemoglobin 11.0 (L) 12.0 - 15.0 g/dL   HCT 33.8 (L) 36.0 - 46.0 %   MCV 93.1 80.0 - 100.0 fL   MCH 30.3 26.0 - 34.0 pg   MCHC 32.5 30.0 - 36.0 g/dL   RDW 16.5 (H) 11.5 - 15.5 %   Platelets 127 (L) 150 - 400 K/uL   nRBC 0.0 0.0 - 0.2 %   Neutrophils Relative % 73 %   Neutro Abs 3.1 1.7 - 7.7 K/uL   Lymphocytes Relative 17 %   Lymphs Abs 0.7 0.7 - 4.0 K/uL   Monocytes Relative 6 %   Monocytes Absolute 0.3 0.1 - 1.0 K/uL   Eosinophils Relative 3 %  Eosinophils Absolute 0.1 0.0 - 0.5 K/uL   Basophils Relative 0 %   Basophils Absolute 0.0 0.0 - 0.1 K/uL   Immature Granulocytes 1 %   Abs Immature Granulocytes 0.02 0.00 - 0.07 K/uL    Comment: Performed at [facility], [place] [address] [place], [place] [address]  Resp Panel by RT-PCR (Flu A&B, Covid) Nasopharyngeal Swab     Status: None   Collection Time: 01/30/21  5:41 PM   Specimen: Nasopharyngeal Swab; Nasopharyngeal(NP) swabs in vial transport medium  Result Value Ref Range   SARS Coronavirus 2 by RT PCR NEGATIVE NEGATIVE    Comment: (NOTE) SARS-CoV-2 target nucleic acids are NOT DETECTED.  The SARS-CoV-2 RNA is generally detectable in upper respiratory specimens during the acute phase of infection. The lowest concentration of SARS-CoV-2 viral copies this assay can detect is 138 copies/mL. A negative result does not preclude SARS-Cov-2 infection and should not be used as the sole basis for treatment or other patient management decisions. A negative result may occur with  improper specimen collection/handling, submission of specimen other than nasopharyngeal swab, presence of viral mutation(s) within the areas targeted by this assay, and inadequate number of viral copies(<138 copies/mL). A negative result must be combined with clinical observations, patient history, and epidemiological information. The expected result is Negative.  Fact  Sheet for Patients:  EntrepreneurPulse.com.au  Fact Sheet for Healthcare Providers:  IncredibleEmployment.be  This test is no t yet approved or cleared by the [place] FDA and  has been authorized for detection and/or diagnosis of SARS-CoV-2 by FDA under an Emergency Use Authorization (EUA). This EUA will remain  in effect (meaning this test can be used) for the duration of the COVID-19 declaration under Section 564(b)(1) of the Act, 21 U.S.C.section 360bbb-3(b)(1), unless the authorization is terminated  or revoked sooner.       Influenza A by PCR NEGATIVE NEGATIVE   Influenza B by PCR NEGATIVE NEGATIVE    Comment: (NOTE) The Xpert Xpress SARS-CoV-2/FLU/RSV plus assay is intended as an aid in the diagnosis of influenza from Nasopharyngeal swab specimens and should not be used as a sole basis for treatment. Nasal washings and aspirates are unacceptable for Xpert Xpress SARS-CoV-2/FLU/RSV testing.  Fact Sheet for Patients: EntrepreneurPulse.com.au  Fact Sheet for Healthcare Providers: IncredibleEmployment.be  This test is not yet approved or cleared by the [place] FDA and has been authorized for detection and/or diagnosis of SARS-CoV-2 by FDA under an Emergency Use Authorization (EUA). This EUA will remain in effect (meaning this test can be used) for the duration of the COVID-19 declaration under Section 564(b)(1) of the Act, 21 U.S.C. section 360bbb-3(b)(1), unless the authorization is terminated or revoked.  Performed at [facility], [place] [address] [place], [place] [address]    *Note: Due to a large number of results and/or encounters for the requested time period, some results have not been displayed. A complete set of results can be found in Results Review.   DG Chest 2 View  Result Date: 01/30/2021 CLINICAL DATA:  Shortness of breath. EXAM: CHEST - 2 VIEW  COMPARISON:  01/08/2021. FINDINGS: Cardiomegaly again noted. Diffuse bilateral interstitial prominence consistent with interstitial edema and or pneumonitis. Tiny bilateral pleural effusions. No pneumothorax. IMPRESSION: 1.  Cardiomegaly again noted. 2. Diffuse bilateral interstitial prominence consistent with interstitial edema and or pneumonitis. Small bilateral pleural effusions. Electronically Signed   By: Marcello Moores  Register   On: 01/30/2021 13:00    Pending Labs [employer] (From  admission, onward)          Start     Ordered   Signed and Held  Basic metabolic panel  Tomorrow morning,   R        Signed and Held          Vitals/Pain Today's Vitals   01/30/21 1630 01/30/21 1700 01/30/21 1702 01/30/21 1815  BP: (!) 195/139 (!) 197/69  (!) 163/59  Pulse: 79 (!) 41 85 79  Resp:  (!) 21 15 15   Temp:      TempSrc:      SpO2: 95% 92% 93% 98%  PainSc:        Isolation Precautions No active isolations  Medications Medications  insulin aspart (novoLOG) injection 0-15 Units (has no administration in time range)  insulin aspart (novoLOG) injection 0-5 Units (has no administration in time range)    Mobility walks with device

## 2021-01-30 NOTE — ED Provider Notes (Signed)
I provided a substantive portion of the care of this patient.  I personally performed the entirety of the history, exam and medical decision making for this encounter.     EKG Interpretation  Date/Time:  Thursday Jan 30 2021 12:37:53 EDT Ventricular Rate:  95 PR Interval:    QRS Duration: 104 QT Interval:  395 QTC Calculation: 523 R Axis:   74 Text Interpretation: Atrial flutter Low voltage, extremity and precordial leads Consider anterior infarct Nonspecific T abnormalities, lateral leads Minimal ST elevation, inferior leads Prolonged QT interval Since last tracing QT has lengthened Otherwise no significant change Confirmed by Daleen Bo [phone]) on 01/30/2021 1:57:56 PM  Patient seen by me along with physician assistant.  Patient has a history of congestive heart failure.  She has had some bilateral leg swelling.  And has erythema chronic skin changes there.  But the big changes been that there is been some weeping from the legs for the first time.  Also patient has been having feeling of shortness of breath.  Denies any chest pain.  5 patient followed by Dr. Marlou Porch from cardiology.  Patient's blood pressures here are also markedly elevated anywhere systolics 02/10/629 5-2 05.  Patient not on blood pressure medicine.  States that she was in the past.  Pressures were too low so it was stopped.  Patient is on alternating days of Lasix.  She was taking that as directed.  Work-up here shows no evidence of any significant congestive heart failure pulmonary edema.  BNP was 90.  X-ray does show some interstitial edema shows some small pleural effusions.  Patient was ambulated oxygen sats that were kind of around 91 at rest actually stayed between 90 and 95 with ambulation.  Said no hypoxia.  Discussed with cardiology on-call about the elevated blood pressures and admission for hypertensive urgency and they concurred that that would be what they would recommend.  We are contacting the  hospitalist for admission.   Fredia Sorrow, MD 01/30/21 [phone]

## 2021-01-30 NOTE — ED Provider Notes (Addendum)
Emergency Medicine Provider Triage Evaluation Note  [name] , a 86 y.o. female  was evaluated in triage.  Pt complains of SHOB, leg swelling, +2lbs yesterday, did not weight today. Legs blistering for the past month, has been wrapping at home, now with stinging pain in legs.  Hx CHF and a fib. Has not taken any home meds today including insulin and lasix. Takes 20mg  lasix three days/week, and 40mg  3 days/week. Review of Systems  Positive: Leg swelling, SHOB, DOE Negative: CP  Physical Exam  BP (!) 187/87 (BP Location: Right Arm)   Pulse 92   Temp 98.2 F (36.8 C) (Oral)   Resp 16   LMP  (LMP Unknown)   SpO2 97%  Gen:   Awake, no distress   Resp:  Normal effort  MSK:   Moves extremities without difficulty  Other:    Medical Decision Making  Medically screening exam initiated at 12:25 PM.  Appropriate orders placed.  [name] was informed that the remainder of the evaluation will be completed by another provider, this initial triage assessment does not replace that evaluation, and the importance of remaining in the ED until their evaluation is complete.     Tacy Learn, PA-C 01/30/21 1227    Tacy Learn, PA-C 01/30/21 1229    Daleen Bo, MD 01/31/21 [phone]

## 2021-01-30 NOTE — H&P (Signed)
History and Physical  [name] VHQ:[id number] DOB: 1935-03-24 DOA: 01/30/2021  PCP: Sara Macadam, MD   Chief Complaint: SOB  HPI:  86 year old woman PMH including atrial flutter, PE, chronic diastolic CHF presented to the emergency department with increasing shortness of breath and lower extremity edema.  Admitted for acute decompensated CHF and hypertensive urgency.  Patient reports lower extremity edema of chronic duration but much worse over the last couple of weeks.  She has gained 3 pounds in the last few days.  This is been associated with increased shortness of breath with ambulation somewhat improved by rest.  She sleeps chronically in a armchair for 12 years. Legs are swollen now weeping. No chest pain.  ED Course: Evaluated in the emergency department, no medications given.  Review of Systems:  Negative for fever, visual changes, sore throat, rash, new muscle aches, chest pain, dysuria bleeding, abdominal pain diarrhea.  PMH . Chronic diastolic CHF . Atrial flutter not on anticoagulation secondary to thrombocytopenia and intolerance to medication . Pulmonary embolism in the past not on anticoagulation now . Diabetes mellitus type 2 on insulin . Hypothyroidism postsurgical in nature . Remainder reviewed in epic  [place] . Thyroidectomy . Kidney stone surgery . Remainder reviewed in Epic  Family history includes: . Mother had a clotting disorder, asthma, hypertension, stroke . Remainder reviewed in [place] History . Non-smoker, nondrinker  Allergies . 15 listed including ASA . Remainder reviewed in Epic  Medications Current Outpatient Medications  Medication Instructions  . acetaminophen (TYLENOL) 650 mg, Oral, 2 times daily  . Amino Acids-Protein Hydrolys (FEEDING SUPPLEMENT, PRO-STAT SUGAR FREE 64,) LIQD 30 mLs, Oral, 2 times daily  . atorvastatin (LIPITOR) 40 mg, Oral, Daily at bedtime  . BD PEN NEEDLE NANO 2ND GEN 32G X 4 MM MISC USE TO  INJECT UP TO FOUR TIMES DAILY AS DIRECTED  . bisacodyl (DULCOLAX) 10 mg, Oral, Every 72 hours PRN  . Bismuth Tribromoph-Petrolatum (XEROFORM PETROLAT PATCH 4"X4") PADS 1 Units, Apply externally, Daily PRN  . ciprofloxacin (CIPRO) 250 mg, Oral, 2 times daily  . Continuous Blood Gluc Sensor (FREESTYLE LIBRE 14 DAY SENSOR) MISC APPLY NEW SENSOR EVERY 14 DAYS AS DIRECTED  . diphenhydrAMINE (BENADRYL) 50 mg, Oral,  Once, Take 1 hour prior to CT scan.  . fexofenadine (ALLEGRA) 180 mg, Oral, Daily  . furosemide (LASIX) 20 MG tablet TAKE 1 TABLET BY MOUTH 4 TIMES EVERY WEEK, ALTERNATING WITH 40 MG THREE TIMES EVERY WEEK  . furosemide (LASIX) 40 MG tablet TAKE 1 TABLET BY MOUTH THREE TIMES WEEKLY, ALTERNATING WITH 20MG  FOUR TIMES WEEKLY  . HUMALOG KWIKPEN 100 UNIT/ML KwikPen INJECT 13 TO 20 UNITS UNDER THE SKIN THREE TIMES DAILY WITH BREAKFAST, LUNCH AND EVENING MEAL  . hyoscyamine (LEVSIN) 0.125 MG tablet 1 tablet, Oral, Daily  . Insulin Syringe-Needle U-100 (INSULIN SYRINGE .3CC/29GX1/2") 29G X 1/2" 0.3 ML MISC Use to inject insulin 4 times a day.  Marland Kitchen LANTUS SOLOSTAR 100 UNIT/ML Solostar Pen ADMINISTER 50 UNITS UNDER THE SKIN DAILY  . levothyroxine (SYNTHROID) 112 mcg, Oral, Daily  . Multiple Vitamin (MULTIVITAMIN) tablet 1 tablet, Oral, Daily,    . potassium citrate (UROCIT-K) 10 MEQ (1080 MG) SR tablet 20 mEq, Oral, 2 times daily  . predniSONE (DELTASONE) 50 MG tablet Take 1 tablet at 13 hours, 7 hours and 1 hour prior to CT scan.  . sertraline (ZOLOFT) 50 MG tablet TAKE 1 TABLET BY MOUTH AT BEDTIME  . vitamin B-12 (CYANOCOBALAMIN) 100 mcg, Oral, Daily  .  vitamin C (ASCORBIC ACID) 250 mg, Oral, Daily  . Vitamin D3 2,000 Units, Oral, Daily    Physicial Exam   Vitals:  . Afebrile 98.2, 15, 79, 814G/81E, systolic up to 563 in the emergency department  Constitutional:   . Appears calm and comfortable sitting in wheelchair Eyes:  . pupils and irises appear normal . Normal lids   ENMT:   . grossly normal hearing  . Lips appear normal Neck:  . neck appears normal, no masses . no thyromegaly Respiratory:  . CTA bilaterally anteriorly.  Crackles both bases. Marland Kitchen Respiratory effort normal.  Cardiovascular:  . irregular, no m/r/g . 3+ bilateral lower extremity edema with brawny skin changes Abdomen:  . Soft Musculoskeletal:  . RUE, LUE, RLE, LLE   . Grossly normal tone.  Moves all extremities to command. Skin:  . No rashes, lesions, ulcers . palpation of skin: no induration or nodules Neurologic:  . Grossly unremarkable Psychiatric:  . Mental status o Mood, affect appropriate . judgment and insight appear intact   I have personally reviewed following labs and imaging studies  Labs:  Marland Kitchen Creatinine stable 1.85, BNP 90, platelets stable 127, hemoglobin 11.0, WBC within normal limits . COVID-negative  Imaging studies:   Chest x-ray with diffuse bilateral interstitial prominence consistent with interstitial edema  Medical tests:   EKG independently reviewed: Atrial flutter rate controlled , prolonged QT chronic, no acute changes seen  ASSESSMENT/PLAN  Acute acute on chronic diastolic CHF, with volume overload lower extremities with associated shortness of breath, echocardiogram LVEF 14-97%, grade 1 diastolic dysfunction October 2020 -- IV diuresis, daily weights, CHF order set  Hypertensive urgency.  Normally normotensive at home patient reports.  Asymptomatic here other than shortness of breath which is probably from CHF. -- I suspect diuresis will correct this.  As needed antihypertensives.  Diabetes mellitus type 2 on insulin with CKD stage IV -- Continue Lantus and meal coverage and add sliding scale --creatinine stable  Hypothyroidism -- Continue levothyroxine  Denies sleep apnea, does not use CPAP  DVT prophylaxis: SCDs Code Status: DNR per patient Family Communication: daughter at bedside Consults called: none    Time spent: 45  minutes  Sara Hodgkins, MD  [facility] Direct contact: see [email or web address]  7PM-7AM contact night coverage as below   1. Check the care team in [facility] and look for a) attending/consulting [facility] provider listed and b) the [facility] team listed 2. Log into [email or web address] and use [place]'s universal password to access. If you do not have the password, please contact the hospital operator. 3. Locate the [facility] provider you are looking for under [facility] and page to a number that you can be directly reached. 4. If you still have difficulty reaching the provider, please page the [facility] (Director on Call) for the Hospitalists listed on amion for assistance.  Severity of Illness: The appropriate patient status for this patient is OBSERVATION. Observation status is judged to be reasonable and necessary in order to provide the required intensity of service to ensure the patient's safety. The patient's presenting symptoms, physical exam findings, and initial radiographic and laboratory data in the context of their medical condition is felt to place them at decreased risk for further clinical deterioration. Furthermore, it is anticipated that the patient will be medically stable for discharge from the hospital within 2 midnights of admission. The following factors support the patient status of observation.   " The patient's presenting symptoms include SOB, leg edema. " The physical exam findings  include leg edema, rales, hypertension. " The initial radiographic and laboratory data are CKD stage IV, thrombocytopenia.    Status is: Observation  The patient remains OBS appropriate and will d/c before 2 midnights.  Dispo: The patient is from: Home              Anticipated d/c is to: Home              Patient currently is not medically stable to d/c.   Difficult to place patient No        01/30/2021, 7:13 PM   Principal Problem:   Acute on chronic diastolic CHF (congestive heart failure)  (HCC) Active Problems:   DM type 2 (diabetes mellitus, type 2) (HCC)   Atypical atrial flutter (HCC)   Hypertensive urgency

## 2021-01-31 ENCOUNTER — Observation Stay (HOSPITAL_COMMUNITY): Payer: Medicare Other

## 2021-01-31 DIAGNOSIS — Z66 Do not resuscitate: Secondary | ICD-10-CM | POA: Diagnosis present

## 2021-01-31 DIAGNOSIS — Z20822 Contact with and (suspected) exposure to covid-19: Secondary | ICD-10-CM | POA: Diagnosis present

## 2021-01-31 DIAGNOSIS — Z91041 Radiographic dye allergy status: Secondary | ICD-10-CM | POA: Diagnosis not present

## 2021-01-31 DIAGNOSIS — Z79899 Other long term (current) drug therapy: Secondary | ICD-10-CM | POA: Diagnosis not present

## 2021-01-31 DIAGNOSIS — I130 Hypertensive heart and chronic kidney disease with heart failure and stage 1 through stage 4 chronic kidney disease, or unspecified chronic kidney disease: Secondary | ICD-10-CM | POA: Diagnosis present

## 2021-01-31 DIAGNOSIS — Z886 Allergy status to analgesic agent status: Secondary | ICD-10-CM | POA: Diagnosis not present

## 2021-01-31 DIAGNOSIS — J9610 Chronic respiratory failure, unspecified whether with hypoxia or hypercapnia: Secondary | ICD-10-CM | POA: Diagnosis present

## 2021-01-31 DIAGNOSIS — Z882 Allergy status to sulfonamides status: Secondary | ICD-10-CM | POA: Diagnosis not present

## 2021-01-31 DIAGNOSIS — Z91040 Latex allergy status: Secondary | ICD-10-CM | POA: Diagnosis not present

## 2021-01-31 DIAGNOSIS — R0602 Shortness of breath: Secondary | ICD-10-CM | POA: Diagnosis present

## 2021-01-31 DIAGNOSIS — E1122 Type 2 diabetes mellitus with diabetic chronic kidney disease: Secondary | ICD-10-CM | POA: Diagnosis present

## 2021-01-31 DIAGNOSIS — I5033 Acute on chronic diastolic (congestive) heart failure: Secondary | ICD-10-CM

## 2021-01-31 DIAGNOSIS — Z91018 Allergy to other foods: Secondary | ICD-10-CM | POA: Diagnosis not present

## 2021-01-31 DIAGNOSIS — N184 Chronic kidney disease, stage 4 (severe): Secondary | ICD-10-CM | POA: Diagnosis present

## 2021-01-31 DIAGNOSIS — Z888 Allergy status to other drugs, medicaments and biological substances status: Secondary | ICD-10-CM | POA: Diagnosis not present

## 2021-01-31 DIAGNOSIS — D696 Thrombocytopenia, unspecified: Secondary | ICD-10-CM | POA: Diagnosis present

## 2021-01-31 DIAGNOSIS — Z86711 Personal history of pulmonary embolism: Secondary | ICD-10-CM | POA: Diagnosis not present

## 2021-01-31 DIAGNOSIS — Z91048 Other nonmedicinal substance allergy status: Secondary | ICD-10-CM | POA: Diagnosis not present

## 2021-01-31 DIAGNOSIS — I484 Atypical atrial flutter: Secondary | ICD-10-CM | POA: Diagnosis present

## 2021-01-31 DIAGNOSIS — Z79890 Hormone replacement therapy (postmenopausal): Secondary | ICD-10-CM | POA: Diagnosis not present

## 2021-01-31 DIAGNOSIS — Z794 Long term (current) use of insulin: Secondary | ICD-10-CM | POA: Diagnosis not present

## 2021-01-31 DIAGNOSIS — E890 Postprocedural hypothyroidism: Secondary | ICD-10-CM | POA: Diagnosis present

## 2021-01-31 DIAGNOSIS — E8770 Fluid overload, unspecified: Secondary | ICD-10-CM | POA: Diagnosis present

## 2021-01-31 DIAGNOSIS — I160 Hypertensive urgency: Secondary | ICD-10-CM | POA: Diagnosis present

## 2021-01-31 DIAGNOSIS — Z8249 Family history of ischemic heart disease and other diseases of the circulatory system: Secondary | ICD-10-CM | POA: Diagnosis not present

## 2021-01-31 LAB — ECHOCARDIOGRAM COMPLETE
AR max vel: 2.65 cm2
AV Area VTI: 2.67 cm2
AV Area mean vel: 2.54 cm2
AV Mean grad: 4 mmHg
AV Peak grad: 7 mmHg
Ao pk vel: 1.32 m/s
Area-P 1/2: 5.97 cm2
Height: 61 in
MV VTI: 2.56 cm2
S' Lateral: 2.7 cm
Weight: 3480 oz

## 2021-01-31 LAB — BASIC METABOLIC PANEL
Anion gap: 9 (ref 5–15)
BUN: 37 mg/dL — ABNORMAL HIGH (ref 8–23)
CO2: 32 mmol/L (ref 22–32)
Calcium: 9.4 mg/dL (ref 8.9–10.3)
Chloride: 102 mmol/L (ref 98–111)
Creatinine, Ser: 1.9 mg/dL — ABNORMAL HIGH (ref 0.44–1.00)
GFR, Estimated: 26 mL/min — ABNORMAL LOW (ref >60)
Glucose, Bld: 157 mg/dL — ABNORMAL HIGH (ref 70–99)
Potassium: 3.7 mmol/L (ref 3.5–5.1)
Sodium: 143 mmol/L (ref 135–145)

## 2021-01-31 LAB — GLUCOSE, CAPILLARY
Glucose-Capillary: 161 mg/dL — ABNORMAL HIGH (ref 70–99)
Glucose-Capillary: 155 mg/dL — ABNORMAL HIGH (ref 70–99)
Glucose-Capillary: 210 mg/dL — ABNORMAL HIGH (ref 70–99)
Glucose-Capillary: 190 mg/dL — ABNORMAL HIGH (ref 70–99)

## 2021-01-31 MED ORDER — HYDRALAZINE HCL 25 MG PO TABS
25.0000 mg | ORAL_TABLET | Freq: Four times a day (QID) | ORAL | Status: DC | PRN
  Administered 2021-01-31 (×2): 25 mg via ORAL
  Filled 2021-01-31 (×3): qty 1

## 2021-01-31 MED ORDER — HYOSCYAMINE SULFATE 0.125 MG SL SUBL
0.1250 mg | SUBLINGUAL_TABLET | Freq: Every day | SUBLINGUAL | Status: DC
  Administered 2021-01-31 – 2021-02-01 (×2): 0.125 mg via SUBLINGUAL
  Filled 2021-01-31 (×2): qty 1

## 2021-01-31 MED ORDER — AMLODIPINE BESYLATE 5 MG PO TABS
5.0000 mg | ORAL_TABLET | Freq: Every day | ORAL | Status: DC
  Administered 2021-01-31: 5 mg via ORAL
  Filled 2021-01-31: qty 1

## 2021-01-31 NOTE — Progress Notes (Signed)
PROGRESS NOTE    [name]  ZOX:[id number] DOB: 1935-01-10 DOA: 01/30/2021 PCP: Caren Macadam, MD   Brief Narrative:  86 year old woman PMH including atrial flutter, PE, chronic diastolic CHF presented to the emergency department with increasing shortness of breath and lower extremity edema.  Admitted for acute decompensated CHF and hypertensive urgency.   Assessment & Plan:   Principal Problem:   Acute on chronic diastolic CHF (congestive heart failure) (HCC) Active Problems:   DM type 2 (diabetes mellitus, type 2) (HCC)   Atypical atrial flutter (HCC)   Hypertensive urgency   Acute acute on chronic diastolic CHF: echocardiogram LVEF 81-19%, grade 1 diastolic dysfunction October 2020.  No shortness of breath.  Chest x-ray shows pulmonary edema.  Very faint crackles at the left base today.  Lower extremity edema improving.  Continue current Lasix, daily weights and strict I's and O's.  We will repeat echo.  Hypertensive urgency.  Per patient, she is normally normotensive at home patient reports.  Blood pressure elevated but she is asymptomatic.  Continue as needed hydralazine and watch another day.  Will lower parameters.  Diabetes mellitus type 2 on insulin with CKD stage IV -- Continue Lantus and meal coverage and add sliding scale --creatinine stable  Hypothyroidism -- Continue levothyroxine   DVT prophylaxis: SCDs Start: 01/30/21 2055   Code Status: DNR  Family Communication: None present at bedside.  Her daughter was on the phone during my encounter.  Plan of care discussed with patient and her daughter in length and they both verbalized understanding and agreed with it.  Status is: Observation  The patient will require care spanning > 2 midnights and should be moved to inpatient because: Inpatient level of care appropriate due to severity of illness  Dispo: The patient is from: Home              Anticipated d/c is to: Home              Patient  currently is not medically stable to d/c.   Difficult to place patient No        Estimated body mass index is 41.1 kg/m as calculated from the following:   Height as of this encounter: 5\' 1"  (1.549 m).   Weight as of this encounter: 98.7 kg.  Pressure Injury 07/08/19 Buttocks Right;Left;Medial;Lower Stage II -  Partial thickness loss of dermis presenting as a shallow open ulcer with a red, pink wound bed without slough. Red, nonblanchable, (Active)  07/08/19 1000  Location: Buttocks  Location Orientation: Right;Left;Medial;Lower  Staging: Stage II -  Partial thickness loss of dermis presenting as a shallow open ulcer with a red, pink wound bed without slough.  Wound Description (Comments): Red, nonblanchable,  Present on Admission: Yes     Nutritional status:               Consultants:   None  Procedures:   Plan  Antimicrobials:  Anti-infectives (From admission, onward)   None         Subjective: Seen and examined.  She states that she feels better.  Denies any shortness of breath or other complaint.  Objective: Vitals:   01/31/21 0500 01/31/21 0501 01/31/21 0522 01/31/21 1023  BP:  (!) 188/100 (!) 172/90 (!) 188/63  Pulse:  100  82  Resp:  20  16  Temp:  98.4 F (36.9 C)  (!) 97.5 F (36.4 C)  TempSrc:  Oral    SpO2:  98%  93%  Weight: 98.7 kg 98.7 kg    Height:        Intake/Output Summary (Last 24 hours) at 01/31/2021 1120 Last data filed at 01/31/2021 0957 Gross per 24 hour  Intake 590 ml  Output 2550 ml  Net -1960 ml   Filed Weights   01/30/21 2051 01/31/21 0500 01/31/21 0501  Weight: 100.2 kg 98.7 kg 98.7 kg    Examination:  General exam: Appears calm and comfortable, morbidly obese Respiratory system: Faint crackles at left base. Respiratory effort normal. Cardiovascular system: S1 & S2 heard, RRR. No JVD, murmurs, rubs, gallops or clicks.  +1 pitting edema bilateral lower extremity. Gastrointestinal system: Abdomen is  nondistended, soft and nontender. No organomegaly or masses felt. Normal bowel sounds heard. Central nervous system: Alert and oriented. No focal neurological deficits. Extremities: Symmetric 5 x 5 power. Skin: No rashes, lesions or ulcers Psychiatry: Judgement and insight appear normal. Mood & affect appropriate.    Data Reviewed: I have personally reviewed following labs and imaging studies  CBC: Recent Labs  Lab 01/30/21 1316  WBC 4.2  NEUTROABS 3.1  HGB 11.0*  HCT 33.8*  MCV 93.1  PLT 528*   Basic Metabolic Panel: Recent Labs  Lab 01/30/21 1316 01/31/21 0525  NA 141 143  K 4.4 3.7  CL 101 102  CO2 31 32  GLUCOSE 187* 157*  BUN 35* 37*  CREATININE 1.85* 1.90*  CALCIUM 9.3 9.4   GFR: Estimated Creatinine Clearance: 24.2 mL/min (A) (by C-G formula based on SCr of 1.9 mg/dL (H)). Liver Function Tests: No results for input(s): AST, ALT, ALKPHOS, BILITOT, PROT, ALBUMIN in the last 168 hours. No results for input(s): LIPASE, AMYLASE in the last 168 hours. No results for input(s): AMMONIA in the last 168 hours. Coagulation Profile: No results for input(s): INR, PROTIME in the last 168 hours. Cardiac Enzymes: No results for input(s): CKTOTAL, CKMB, CKMBINDEX, TROPONINI in the last 168 hours. BNP (last 3 results) No results for input(s): PROBNP in the last 8760 hours. HbA1C: No results for input(s): HGBA1C in the last 72 hours. CBG: Recent Labs  Lab 01/30/21 2058 01/31/21 0711  GLUCAP 197* 161*   Lipid Profile: No results for input(s): CHOL, HDL, LDLCALC, TRIG, CHOLHDL, LDLDIRECT in the last 72 hours. Thyroid Function Tests: No results for input(s): TSH, T4TOTAL, FREET4, T3FREE, THYROIDAB in the last 72 hours. Anemia Panel: No results for input(s): VITAMINB12, FOLATE, FERRITIN, TIBC, IRON, RETICCTPCT in the last 72 hours. Sepsis Labs: No results for input(s): PROCALCITON, LATICACIDVEN in the last 168 hours.  Recent Results (from the past 240 hour(s))  Resp  Panel by RT-PCR (Flu A&B, Covid) Nasopharyngeal Swab     Status: None   Collection Time: 01/30/21  5:41 PM   Specimen: Nasopharyngeal Swab; Nasopharyngeal(NP) swabs in vial transport medium  Result Value Ref Range Status   SARS Coronavirus 2 by RT PCR NEGATIVE NEGATIVE Final    Comment: (NOTE) SARS-CoV-2 target nucleic acids are NOT DETECTED.  The SARS-CoV-2 RNA is generally detectable in upper respiratory specimens during the acute phase of infection. The lowest concentration of SARS-CoV-2 viral copies this assay can detect is 138 copies/mL. A negative result does not preclude SARS-Cov-2 infection and should not be used as the sole basis for treatment or other patient management decisions. A negative result may occur with  improper specimen collection/handling, submission of specimen other than nasopharyngeal swab, presence of viral mutation(s) within the areas targeted by this assay, and inadequate number of viral copies(<138 copies/mL). A negative  result must be combined with clinical observations, patient history, and epidemiological information. The expected result is Negative.  Fact Sheet for Patients:  EntrepreneurPulse.com.au  Fact Sheet for Healthcare Providers:  IncredibleEmployment.be  This test is no t yet approved or cleared by the [place] FDA and  has been authorized for detection and/or diagnosis of SARS-CoV-2 by FDA under an Emergency Use Authorization (EUA). This EUA will remain  in effect (meaning this test can be used) for the duration of the COVID-19 declaration under Section 564(b)(1) of the Act, 21 U.S.C.section 360bbb-3(b)(1), unless the authorization is terminated  or revoked sooner.       Influenza A by PCR NEGATIVE NEGATIVE Final   Influenza B by PCR NEGATIVE NEGATIVE Final    Comment: (NOTE) The Xpert Xpress SARS-CoV-2/FLU/RSV plus assay is intended as an aid in the diagnosis of influenza from Nasopharyngeal  swab specimens and should not be used as a sole basis for treatment. Nasal washings and aspirates are unacceptable for Xpert Xpress SARS-CoV-2/FLU/RSV testing.  Fact Sheet for Patients: EntrepreneurPulse.com.au  Fact Sheet for Healthcare Providers: IncredibleEmployment.be  This test is not yet approved or cleared by the [place] FDA and has been authorized for detection and/or diagnosis of SARS-CoV-2 by FDA under an Emergency Use Authorization (EUA). This EUA will remain in effect (meaning this test can be used) for the duration of the COVID-19 declaration under Section 564(b)(1) of the Act, 21 U.S.C. section 360bbb-3(b)(1), unless the authorization is terminated or revoked.  Performed at [facility], [place] [address], [place], [place] [address]       Radiology Studies: DG Chest 2 View  Result Date: 01/30/2021 CLINICAL DATA:  Shortness of breath. EXAM: CHEST - 2 VIEW COMPARISON:  01/08/2021. FINDINGS: Cardiomegaly again noted. Diffuse bilateral interstitial prominence consistent with interstitial edema and or pneumonitis. Tiny bilateral pleural effusions. No pneumothorax. IMPRESSION: 1.  Cardiomegaly again noted. 2. Diffuse bilateral interstitial prominence consistent with interstitial edema and or pneumonitis. Small bilateral pleural effusions. Electronically Signed   By: Marcello Moores  Register   On: 01/30/2021 13:00    Scheduled Meds: . acetaminophen  650 mg Oral BID  . atorvastatin  40 mg Oral QHS  . furosemide  40 mg Intravenous BID  . hyoscyamine  0.125 mg Sublingual Daily  . insulin aspart  0-15 Units Subcutaneous TID WC  . insulin aspart  0-5 Units Subcutaneous QHS  . insulin aspart  13 Units Subcutaneous TID WC  . insulin glargine  46 Units Subcutaneous QHS  . levothyroxine  112 mcg Oral Q0600  . sertraline  50 mg Oral QHS  . sodium chloride flush  3 mL Intravenous Q12H  . sodium chloride flush  3 mL Intravenous  Q12H   Continuous Infusions: . sodium chloride       LOS: 0 days   Time spent: 32 minutes   Darliss Cheney, MD [facility]  01/31/2021, 11:20 AM   How to contact the [facility] Attending or Consulting provider [place] or covering provider during after hours [place], for this patient?  1. Check the care team in [facility] and look for a) attending/consulting [facility] provider listed and b) the [facility] team listed. Page or secure chat [phone]. 2. Log into [email or web address] and use [place]'s universal password to access. If you do not have the password, please contact the hospital operator. 3. Locate the [facility] provider you are looking for under [facility] and page to a number that you can be directly reached. 4. If  you still have difficulty reaching the provider, please page the [facility] (Director on Call) for the Hospitalists listed on amion for assistance.

## 2021-01-31 NOTE — Progress Notes (Signed)
This RN agrees with the previous RN's assessment.

## 2021-01-31 NOTE — Plan of Care (Signed)
  Problem: Activity: Goal: Risk for activity intolerance will decrease Outcome: Adequate for Discharge   

## 2021-01-31 NOTE — Plan of Care (Signed)

## 2021-02-01 ENCOUNTER — Other Ambulatory Visit: Payer: Self-pay

## 2021-02-01 LAB — BASIC METABOLIC PANEL
Anion gap: 10 (ref 5–15)
BUN: 37 mg/dL — ABNORMAL HIGH (ref 8–23)
CO2: 32 mmol/L (ref 22–32)
Calcium: 9.5 mg/dL (ref 8.9–10.3)
Chloride: 101 mmol/L (ref 98–111)
Creatinine, Ser: 1.91 mg/dL — ABNORMAL HIGH (ref 0.44–1.00)
GFR, Estimated: 26 mL/min — ABNORMAL LOW (ref >60)
Glucose, Bld: 174 mg/dL — ABNORMAL HIGH (ref 70–99)
Potassium: 3.8 mmol/L (ref 3.5–5.1)
Sodium: 143 mmol/L (ref 135–145)

## 2021-02-01 LAB — GLUCOSE, CAPILLARY
Glucose-Capillary: 184 mg/dL — ABNORMAL HIGH (ref 70–99)
Glucose-Capillary: 216 mg/dL — ABNORMAL HIGH (ref 70–99)

## 2021-02-01 MED ORDER — AMLODIPINE BESYLATE 10 MG PO TABS
10.0000 mg | ORAL_TABLET | Freq: Every day | ORAL | Status: DC
  Administered 2021-02-01: 10 mg via ORAL
  Filled 2021-02-01: qty 1

## 2021-02-01 MED ORDER — AMLODIPINE BESYLATE 10 MG PO TABS
10.0000 mg | ORAL_TABLET | Freq: Every day | ORAL | 0 refills | Status: DC
Start: 2021-02-01 — End: 2021-02-18

## 2021-02-01 NOTE — Discharge Instructions (Signed)
Heart Failure Eating Plan Heart failure, also called congestive heart failure, occurs when your heart does not pump blood well enough to meet your body's needs for oxygen-rich blood. Heart failure is a long-term (chronic) condition. Living with heart failure can be challenging. Following your health care provider's instructions about a healthy lifestyle and working with a dietitian to choose the right foods may help to improve your symptoms. An eating plan for someone with heart failure will include changes that limit the intake of salt (sodium) and unhealthy fat. What are tips for following this plan? Reading food labels  Check food labels for the amount of sodium per serving. Choose foods that have less than 140 mg (milligrams) of sodium in each serving.  Check food labels for the number of calories per serving. This is important if you need to limit your daily calorie intake to lose weight.  Check food labels for the serving size. If you eat more than one serving, you will be eating more sodium and calories than what is listed on the label.  Look for foods that are labeled as "sodium-free," "very low sodium," or "low sodium." ? Foods labeled as "reduced sodium" or "lightly salted" may still have more sodium than what is recommended for you. Cooking  Avoid adding salt when cooking. Ask your health care provider or dietitian before using salt substitutes.  Season food with salt-free seasonings, spices, or herbs. Check the label of seasoning mixes to make sure they do not contain salt.  Cook with heart-healthy oils, such as olive, canola, soybean, or sunflower oil.  Do not fry foods. Cook foods using low-fat methods, such as baking, boiling, grilling, and broiling.  Limit unhealthy fats when cooking by: ? Removing the skin from poultry, such as chicken. ? Removing all visible fats from meats. ? Skimming the fat off from stews, soups, and gravies before serving them. Meal planning  Limit  your intake of: ? Processed, canned, or prepackaged foods. ? Foods that are high in trans fat, such as fried foods. ? Sweets, desserts, sugary drinks, and other foods with added sugar. ? Full-fat dairy products, such as whole milk.  Eat a balanced diet. This may include: ? 4-5 servings of fruit each day and 4-5 servings of vegetables each day. At each meal, try to fill one-half of your plate with fruits and vegetables. ? Up to 6-8 servings of whole grains each day. ? Up to 2 servings of lean meat, poultry, or fish each day. One serving of meat is equal to 3 oz (85 g). This is about the same size as a deck of cards. ? 2 servings of low-fat dairy each day. ? Heart-healthy fats. Healthy fats called omega-3 fatty acids are found in foods such as flaxseed and cold-water fish like sardines, salmon, and mackerel.  Aim to eat 25-35 g (grams) of fiber a day. Foods that are high in fiber include apples, broccoli, carrots, beans, peas, and whole grains.  Do not add salt or condiments that contain salt (such as soy sauce) to foods before eating.  When eating at a restaurant, ask that your food be prepared with less salt or no salt, if possible.  Try to eat 2 or more vegetarian meals each week.  Eat more home-cooked food and eat less restaurant, buffet, and fast food.   General information  Do not eat more than 2,300 mg of sodium a day. The amount of sodium that is recommended for you may be lower, depending on your   condition.  Maintain a healthy body weight as directed. Ask your health care provider what a healthy weight is for you. ? Check your weight every day. ? Work with your health care provider and dietitian to make a plan that is right for you to lose weight or maintain your current weight.  Limit how much fluid you drink. Ask your health care provider or dietitian how much fluid you can have each day.  Limit or avoid alcohol as told by your health care provider or dietitian. Recommended  foods Fruits All fresh, frozen, and canned fruits. Dried fruits, such as raisins, prunes, and cranberries. Vegetables All fresh vegetables. Vegetables that are frozen without sauce or added salt. Low-sodium or sodium-free canned vegetables. Grains Bread with less than 80 mg of sodium per slice. Whole-wheat pasta, quinoa, and brown rice. Oats and oatmeal. Barley. Millet. Grits and cream of wheat. Whole-grain and whole-wheat cold cereal. Meats and other protein foods Lean cuts of meat. Skinless chicken and turkey. Fish with high omega-3 fatty acids, such as salmon, sardines, and other cold-water fishes. Eggs. Dried beans, peas, and edamame. Unsalted nuts and nut butters. Dairy Low-fat or nonfat (skim) milk and dried milk. Rice milk, soy milk, and almond milk. Low-fat or nonfat yogurt. Small amounts of reduced-sodium block cheese. Low-sodium cottage cheese. Fats and oils Olive, canola, soybean, flaxseed, avocado, or sunflower oil. Sweets and desserts Applesauce. Granola bars. Sugar-free pudding and gelatin. Frozen fruit bars. Seasoning and other foods Fresh and dried herbs. Lemon or lime juice. Vinegar. Low-sodium ketchup. Salt-free marinades, salad dressings, sauces, and seasonings. The items listed above may not be a complete list of foods and beverages you can eat. Contact a dietitian for more information. Foods to avoid Fruits Fruits that are dried with sodium-containing preservatives. Vegetables Canned vegetables. Frozen vegetables with sauce or seasonings. Creamed vegetables. French fries. Onion rings. Pickled vegetables and sauerkraut. Grains Bread with more than 80 mg of sodium per slice. Hot or cold cereal with more than 140 mg sodium per serving. Salted pretzels and crackers. Prepackaged breadcrumbs. Bagels, croissants, and biscuits. Meats and other protein foods Ribs and chicken wings. Bacon, ham, pepperoni, bologna, salami, and packaged luncheon meats. Hot dogs, bratwurst, and  sausage. Canned meat. Smoked meat and fish. Salted nuts and seeds. Dairy Whole milk, half-and-half, and cream. Buttermilk. Processed cheese, cheese spreads, and cheese curds. Regular cottage cheese. Feta cheese. Shredded cheese. String cheese. Fats and oils Butter, lard, shortening, ghee, and bacon fat. Canned and packaged gravies. Seasoning and other foods Onion salt, garlic salt, table salt, and sea salt. Marinades. Regular salad dressings. Relishes, pickles, and olives. Meat flavorings and tenderizers, and bouillon cubes. Horseradish, ketchup, and mustard. Worcestershire sauce. Teriyaki sauce, soy sauce (including reduced sodium). Hot sauce and Tabasco sauce. Steak sauce, fish sauce, oyster sauce, and cocktail sauce. Taco seasonings. Barbecue sauce. Tartar sauce. The items listed above may not be a complete list of foods and beverages you should avoid. Contact a dietitian for more information. Summary  A heart failure eating plan includes changes that limit your intake of sodium and unhealthy fat, and it may help you lose weight or maintain a healthy weight. Your health care provider may also recommend limiting how much fluid you drink.  Most people with heart failure should eat no more than 2,300 mg of salt (sodium) a day. The amount of sodium that is recommended for you may be lower, depending on your condition.  Contact your health care provider or dietitian before making any major   changes to your diet. This information is not intended to replace advice given to you by your health care provider. Make sure you discuss any questions you have with your health care provider. Document Revised: 04/08/2020 Document Reviewed: 04/08/2020 Elsevier Patient Education  2021 [employer]  

## 2021-02-01 NOTE — Discharge Summary (Signed)
Physician Discharge Summary  Lacretia Tindall EXN:[id number] DOB: May 03, 1935 DOA: 01/30/2021  PCP: Caren Macadam, MD  Admit date: 01/30/2021 Discharge date: 02/01/2021 30 Day Unplanned Readmission Risk Score   Flowsheet Row ED to Hosp-Admission (Current) from 01/30/2021 in [place]  30 Day Unplanned Readmission Risk Score (%) 17.82 Filed at 02/01/2021 0800     This score is the patient's risk of an unplanned readmission within 30 days of being discharged (0 -100%). The score is based on dignosis, age, lab data, medications, orders, and past utilization.   Low:  0-14.9   Medium: 15-21.9   High: 22-29.9   Extreme: 30 and above         Admitted From: Home Disposition: Home  Recommendations for Outpatient Follow-up:  1. Follow up with PCP in 1-2 weeks 2. Please obtain BMP/CBC in one week 3. Please follow up with your PCP on the following pending results: Unresulted Labs (From admission, onward)         None        Home Health: None Equipment/Devices: None  Discharge Condition: Stable CODE STATUS: DNR Diet recommendation: Low-sodium  Subjective: Seen and examined.  No shortness of breath or other complaint.  Ready to go home.  Brief/Interim Summary: 86 year old woman PMH including atrial flutter, PE not on anticoagulation due to thrombocytopenia and history of bleeding of unknown source, chronic diastolic CHF presented to the emergency department with increasing shortness of breath and lower extremity edema. Admitted for acute decompensated CHF and hypertensive urgency.  Started on IV Lasix as well as as needed hydralazine.  Her shortness of breath and lower extreme edema improved significantly within 24 hours but her blood pressure remained elevated so she was started on amlodipine 5 mg which controlled her blood pressure to some extent but since blood pressure was still elevated so it was increased to 10 mg.  Patient is feeling  much better today with no shortness of breath.  Lower extremity edema is only trace.  She is a stable for discharge.  She will be discharged on current dose of Lasix and addition of amlodipine 10 mg p.o. daily.  She has been advised to check her blood pressure 2 times daily as she was doing before and follow-up with PCP within a week and bring the log to PCP so her PCP can further adjust medications.  She needs to call her cardiology on Tuesday to make an appointment to see them within 1 to 2 weeks as well.  All of this plan of care was discussed with patient and patient's daughter over the phone.  They are in agreement.  Discharge Diagnoses:  Principal Problem:   Acute on chronic diastolic CHF (congestive heart failure) (HCC) Active Problems:   DM type 2 (diabetes mellitus, type 2) (HCC)   Atypical atrial flutter (HCC)   Hypertensive urgency    Discharge Instructions   Allergies as of 02/01/2021      Reactions   Adhesive [tape] Other (See Comments)   PULLS OFF THE SKIN   Apixaban Other (See Comments)   Internal Bleeding   Aspirin Itching, Rash, Hives, Swelling   Swelling of her tongue   Mirabegron Other (See Comments)   Patient experienced A-Fib   Pineapple Anaphylaxis, Swelling   Throat swells and blisters on tongue and roof of mouth per patient   Metformin Diarrhea, Nausea Only   Tetracycline Hives   Fluticasone-salmeterol Itching, Rash   Iodinated Diagnostic Agents Itching, Rash  Lactose Intolerance (gi) Other (See Comments)   Gas   Latex Itching, Rash, Other (See Comments)   Pulls off the skin and causes welts   Lisinopril Cough   Metronidazole Other (See Comments)   Reaction not known   Sulfa Antibiotics Rash   Sulfonamide Derivatives Itching, Rash      Medication List    TAKE these medications   acetaminophen 325 MG tablet Commonly known as: TYLENOL Take 650 mg by mouth in the morning and at bedtime.   amLODipine 10 MG tablet Commonly known as:  NORVASC Take 1 tablet (10 mg total) by mouth daily.   atorvastatin 40 MG tablet Commonly known as: LIPITOR Take 40 mg by mouth at bedtime.   BD Pen Needle Nano 2nd Gen 32G X 4 MM Misc Generic drug: Insulin Pen Needle USE TO INJECT UP TO FOUR TIMES DAILY AS DIRECTED   bisacodyl 5 MG EC tablet Commonly known as: DULCOLAX Take 10 mg by mouth every 3 (three) days as needed (for constipation).   ciprofloxacin 250 MG tablet Commonly known as: Cipro Take 1 tablet (250 mg total) by mouth 2 (two) times daily.   diphenhydrAMINE 50 MG tablet Commonly known as: BENADRYL Take 1 tablet (50 mg total) by mouth once for 1 dose. Take 1 hour prior to CT scan.   feeding supplement (PRO-STAT SUGAR FREE 64) Liqd Take 30 mLs by mouth 2 (two) times daily. What changed:   when to take this  reasons to take this   fexofenadine 180 MG tablet Commonly known as: ALLEGRA Take 180 mg by mouth daily.   FreeStyle Libre 14 Day Sensor Misc APPLY NEW SENSOR EVERY 14 DAYS AS DIRECTED   furosemide 40 MG tablet Commonly known as: LASIX TAKE 1 TABLET BY MOUTH THREE TIMES WEEKLY, ALTERNATING WITH 20MG  FOUR TIMES WEEKLY   furosemide 20 MG tablet Commonly known as: LASIX TAKE 1 TABLET BY MOUTH 4 TIMES EVERY WEEK, ALTERNATING WITH 40 MG THREE TIMES EVERY WEEK   HumaLOG KwikPen 100 UNIT/ML KwikPen Generic drug: insulin lispro INJECT 13 TO 20 UNITS UNDER THE SKIN THREE TIMES DAILY WITH BREAKFAST, LUNCH AND EVENING MEAL What changed: See the new instructions.   hyoscyamine 0.125 MG tablet Commonly known as: LEVSIN Take 1 tablet by mouth daily.   INSULIN SYRINGE .3CC/29GX1/2" 29G X 1/2" 0.3 ML Misc Use to inject insulin 4 times a day.   Lantus SoloStar 100 UNIT/ML Solostar Pen Generic drug: insulin glargine ADMINISTER 50 UNITS UNDER THE SKIN DAILY What changed: See the new instructions.   levothyroxine 112 MCG tablet Commonly known as: SYNTHROID Take 1 tablet (112 mcg total) by mouth daily.    multivitamin tablet Take 1 tablet by mouth daily.   potassium citrate 10 MEQ (1080 MG) SR tablet Commonly known as: UROCIT-K Take 20 mEq by mouth in the morning and at bedtime.   predniSONE 50 MG tablet Commonly known as: DELTASONE Take 1 tablet at 13 hours, 7 hours and 1 hour prior to CT scan.   sertraline 50 MG tablet Commonly known as: ZOLOFT TAKE 1 TABLET BY MOUTH AT BEDTIME   vitamin B-12 100 MCG tablet Commonly known as: CYANOCOBALAMIN Take 100 mcg by mouth daily.   vitamin C 250 MG tablet Commonly known as: ASCORBIC ACID Take 250 mg by mouth daily.   Vitamin D3 50 MCG (2000 UT) Tabs Take 2,000 Units by mouth daily.   Xeroform Petrolat Patch 4"x4" Pads Apply 1 Units topically daily as needed. What changed: reasons to take  this       Follow-up Information    Koberlein, Steele Berg, MD Follow up in 1 week(s).   Specialty: Family Medicine Contact information: [place] [address] [phone]        Jerline Pain, MD .   Specialty: Cardiology Contact information: [phone] [place] [address] [phone]              Allergies  Allergen Reactions  . Adhesive [Tape] Other (See Comments)    PULLS OFF THE SKIN  . Apixaban Other (See Comments)    Internal Bleeding  . Aspirin Itching, Rash, Hives and Swelling    Swelling of her tongue  . Mirabegron Other (See Comments)    Patient experienced A-Fib  . Pineapple Anaphylaxis and Swelling    Throat swells and blisters on tongue and roof of mouth per patient  . Metformin Diarrhea and Nausea Only  . Tetracycline Hives  . Fluticasone-Salmeterol Itching and Rash  . Iodinated Diagnostic Agents Itching and Rash       . Lactose Intolerance (Gi) Other (See Comments)    Gas   . Latex Itching, Rash and Other (See Comments)    Pulls off the skin and causes welts  . Lisinopril Cough  . Metronidazole Other (See Comments)    Reaction not known  . Sulfa  Antibiotics Rash  . Sulfonamide Derivatives Itching and Rash    Consultations: None   Procedures/Studies: DG Chest 2 View  Result Date: 01/30/2021 CLINICAL DATA:  Shortness of breath. EXAM: CHEST - 2 VIEW COMPARISON:  01/08/2021. FINDINGS: Cardiomegaly again noted. Diffuse bilateral interstitial prominence consistent with interstitial edema and or pneumonitis. Tiny bilateral pleural effusions. No pneumothorax. IMPRESSION: 1.  Cardiomegaly again noted. 2. Diffuse bilateral interstitial prominence consistent with interstitial edema and or pneumonitis. Small bilateral pleural effusions. Electronically Signed   By: Marcello Moores  Register   On: 01/30/2021 13:00   DG Chest 2 View  Result Date: 01/08/2021 CLINICAL DATA:  Shortness of breath EXAM: CHEST - 2 VIEW COMPARISON:  05/27/2020 FINDINGS: Cardiac shadow is enlarged. Aortic calcifications are again seen. Mild vascular congestion is noted without significant edema. No focal infiltrate or effusion is noted. No acute bony abnormality is seen. IMPRESSION: Mild vascular congestion without interstitial edema. Electronically Signed   By: Inez Catalina M.D.   On: 01/08/2021 15:42   NM Pulmonary Perfusion  Result Date: 01/08/2021 CLINICAL DATA:  Shortness of breath and history of prior pulmonary embolism EXAM: NUCLEAR MEDICINE PERFUSION LUNG SCAN TECHNIQUE: Perfusion images were obtained in multiple projections after intravenous injection of radiopharmaceutical. Ventilation scans intentionally deferred if perfusion scan and chest x-ray adequate for interpretation during COVID 19 epidemic. RADIOPHARMACEUTICALS:  4.2 mCi Tc-9m MAA IV COMPARISON:  None. FINDINGS: Cardiac shadow is enlarged. Perfusion images of both lungs demonstrate no focal defect to suggest pulmonary embolism. IMPRESSION: No evidence to suggest pulmonary embolism. Electronically Signed   By: Inez Catalina M.D.   On: 01/08/2021 15:41   ECHOCARDIOGRAM COMPLETE  Result Date: 01/31/2021     ECHOCARDIOGRAM REPORT   Patient Name:   [name] [facility] Date of Exam: 01/31/2021 Medical Rec #:  [id number]             Height:       61.0 in Accession #:    [id number]            Weight:       217.5 lb Date of Birth:  Dec 23, 1934  BSA:          1.957 m Patient Age:    41 years              BP:           188/63 mmHg Patient Gender: F                     HR:           82 bpm. Exam Location:  Inpatient Procedure: 2D Echo, Cardiac Doppler and Color Doppler Indications:    CHF  History:        Patient has prior history of Echocardiogram examinations, most                 recent 07/05/2019. Risk Factors:Hypertension and Diabetes.  Sonographer:    Cammy Brochure Referring Phys: [id number] Kiah Keay IMPRESSIONS  1. Left ventricular ejection fraction, by estimation, is 60 to 65%. The left ventricle has normal function. The left ventricle has no regional wall motion abnormalities. There is moderate concentric left ventricular hypertrophy. Left ventricular diastolic function could not be evaluated.  2. Right ventricular systolic function is normal. The right ventricular size is normal. There is normal pulmonary artery systolic pressure. The estimated right ventricular systolic pressure is 69.4 mmHg.  3. Left atrial size was moderately dilated.  4. Right atrial size was mildly dilated.  5. A small pericardial effusion is present. The pericardial effusion is posterior to the left ventricle.  6. The mitral valve is degenerative. Mild mitral valve regurgitation. No evidence of mitral stenosis.  7. The aortic valve is tricuspid. There is mild calcification of the aortic valve. There is mild thickening of the aortic valve. Aortic valve regurgitation is not visualized. Mild aortic valve sclerosis is present, with no evidence of aortic valve stenosis.  8. The inferior vena cava is normal in size with greater than 50% respiratory variability, suggesting right atrial pressure of 3 mmHg. Comparison(s): Changes from  prior study are noted. The left ventricular function has improved. FINDINGS  Left Ventricle: Left ventricular ejection fraction, by estimation, is 60 to 65%. The left ventricle has normal function. The left ventricle has no regional wall motion abnormalities. The left ventricular internal cavity size was normal in size. There is  moderate concentric left ventricular hypertrophy. Left ventricular diastolic function could not be evaluated due to atrial fibrillation. Left ventricular diastolic function could not be evaluated. Right Ventricle: The right ventricular size is normal. No increase in right ventricular wall thickness. Right ventricular systolic function is normal. There is normal pulmonary artery systolic pressure. The tricuspid regurgitant velocity is 2.67 m/s, and  with an assumed right atrial pressure of 3 mmHg, the estimated right ventricular systolic pressure is 85.4 mmHg. Left Atrium: Left atrial size was moderately dilated. Right Atrium: Right atrial size was mildly dilated. Pericardium: A small pericardial effusion is present. The pericardial effusion is posterior to the left ventricle. Presence of pericardial fat pad. Mitral Valve: The mitral valve is degenerative in appearance. There is mild calcification of the anterior and posterior mitral valve leaflet(s). Mild mitral annular calcification. Mild mitral valve regurgitation. No evidence of mitral valve stenosis. MV peak gradient, 11.3 mmHg. The mean mitral valve gradient is 4.0 mmHg. Tricuspid Valve: The tricuspid valve is grossly normal. Tricuspid valve regurgitation is mild . No evidence of tricuspid stenosis. Aortic Valve: The aortic valve is tricuspid. There is mild calcification of the aortic valve. There is mild thickening of the aortic valve.  Aortic valve regurgitation is not visualized. Mild aortic valve sclerosis is present, with no evidence of aortic valve stenosis. Aortic valve mean gradient measures 4.0 mmHg. Aortic valve peak gradient  measures 7.0 mmHg. Aortic valve area, by VTI measures 2.67 cm. Pulmonic Valve: The pulmonic valve was grossly normal. Pulmonic valve regurgitation is not visualized. No evidence of pulmonic stenosis. Aorta: The aortic root and ascending aorta are structurally normal, with no evidence of dilitation. Venous: The inferior vena cava is normal in size with greater than 50% respiratory variability, suggesting right atrial pressure of 3 mmHg. IAS/Shunts: The atrial septum is grossly normal.  LEFT VENTRICLE PLAX 2D LVIDd:         3.90 cm LVIDs:         2.70 cm LV PW:         1.40 cm LV IVS:        1.20 cm LVOT diam:     2.10 cm LV SV:         73 LV SV Index:   38 LVOT Area:     3.46 cm  RIGHT VENTRICLE RV Basal diam:  3.30 cm RV S prime:     9.57 cm/s LEFT ATRIUM             Index       RIGHT ATRIUM           Index LA diam:        4.30 cm 2.20 cm/m  RA Area:     27.30 cm LA Vol (A2C):   78.3 ml 40.00 ml/m RA Volume:   88.60 ml  45.27 ml/m LA Vol (A4C):   83.5 ml 42.66 ml/m LA Biplane Vol: 88.1 ml 45.01 ml/m  AORTIC VALVE AV Area (Vmax):    2.65 cm AV Area (Vmean):   2.54 cm AV Area (VTI):     2.67 cm AV Vmax:           132.00 cm/s AV Vmean:          92.300 cm/s AV VTI:            0.275 m AV Peak Grad:      7.0 mmHg AV Mean Grad:      4.0 mmHg LVOT Vmax:         101.00 cm/s LVOT Vmean:        67.800 cm/s LVOT VTI:          0.212 m LVOT/AV VTI ratio: 0.77  AORTA Ao Root diam: 3.40 cm Ao Asc diam:  2.70 cm MITRAL VALVE                TRICUSPID VALVE MV Area (PHT): 5.97 cm     TR Peak grad:   28.5 mmHg MV Area VTI:   2.56 cm     TR Vmax:        267.00 cm/s MV Peak grad:  11.3 mmHg MV Mean grad:  4.0 mmHg     SHUNTS MV Vmax:       1.68 m/s     Systemic VTI:  0.21 m MV Vmean:      87.9 cm/s    Systemic Diam: 2.10 cm MV Decel Time: 127 msec MV E velocity: 141.00 cm/s MV A velocity: 73.70 cm/s MV E/A ratio:  1.91 Eleonore Chiquito MD Electronically signed by Eleonore Chiquito MD Signature Date/Time: 01/31/2021/5:11:19 PM     Final       Discharge Exam: Vitals:   01/31/21 2158 02/01/21 1779  BP: (!) 180/71 (!) 167/76  Pulse: 75 79  Resp: 20 20  Temp: 98.4 F (36.9 C) 97.8 F (36.6 C)  SpO2: 91% 91%   Vitals:   01/31/21 1722 01/31/21 1836 01/31/21 2158 02/01/21 0611  BP: (!) 176/69 (!) 179/61 (!) 180/71 (!) 167/76  Pulse:  63 75 79  Resp:   20 20  Temp:   98.4 F (36.9 C) 97.8 F (36.6 C)  TempSrc:   Oral Oral  SpO2:   91% 91%  Weight:      Height:        General: Pt is alert, awake, not in acute distress Cardiovascular: RRR, S1/S2 +, no rubs, no gallops Respiratory: CTA bilaterally, no wheezing, no rhonchi Abdominal: Soft, NT, ND, bowel sounds + Extremities: Trace pitting edema bilateral lower extremity, no cyanosis    The results of significant diagnostics from this hospitalization (including imaging, microbiology, ancillary and laboratory) are listed below for reference.     Microbiology: Recent Results (from the past 240 hour(s))  Resp Panel by RT-PCR (Flu A&B, Covid) Nasopharyngeal Swab     Status: None   Collection Time: 01/30/21  5:41 PM   Specimen: Nasopharyngeal Swab; Nasopharyngeal(NP) swabs in vial transport medium  Result Value Ref Range Status   SARS Coronavirus 2 by RT PCR NEGATIVE NEGATIVE Final    Comment: (NOTE) SARS-CoV-2 target nucleic acids are NOT DETECTED.  The SARS-CoV-2 RNA is generally detectable in upper respiratory specimens during the acute phase of infection. The lowest concentration of SARS-CoV-2 viral copies this assay can detect is 138 copies/mL. A negative result does not preclude SARS-Cov-2 infection and should not be used as the sole basis for treatment or other patient management decisions. A negative result may occur with  improper specimen collection/handling, submission of specimen other than nasopharyngeal swab, presence of viral mutation(s) within the areas targeted by this assay, and inadequate number of viral copies(<138 copies/mL). A  negative result must be combined with clinical observations, patient history, and epidemiological information. The expected result is Negative.  Fact Sheet for Patients:  EntrepreneurPulse.com.au  Fact Sheet for Healthcare Providers:  IncredibleEmployment.be  This test is no t yet approved or cleared by the [place] FDA and  has been authorized for detection and/or diagnosis of SARS-CoV-2 by FDA under an Emergency Use Authorization (EUA). This EUA will remain  in effect (meaning this test can be used) for the duration of the COVID-19 declaration under Section 564(b)(1) of the Act, 21 U.S.C.section 360bbb-3(b)(1), unless the authorization is terminated  or revoked sooner.       Influenza A by PCR NEGATIVE NEGATIVE Final   Influenza B by PCR NEGATIVE NEGATIVE Final    Comment: (NOTE) The Xpert Xpress SARS-CoV-2/FLU/RSV plus assay is intended as an aid in the diagnosis of influenza from Nasopharyngeal swab specimens and should not be used as a sole basis for treatment. Nasal washings and aspirates are unacceptable for Xpert Xpress SARS-CoV-2/FLU/RSV testing.  Fact Sheet for Patients: EntrepreneurPulse.com.au  Fact Sheet for Healthcare Providers: IncredibleEmployment.be  This test is not yet approved or cleared by the [place] FDA and has been authorized for detection and/or diagnosis of SARS-CoV-2 by FDA under an Emergency Use Authorization (EUA). This EUA will remain in effect (meaning this test can be used) for the duration of the COVID-19 declaration under Section 564(b)(1) of the Act, 21 U.S.C. section 360bbb-3(b)(1), unless the authorization is terminated or revoked.  Performed at [facility], [place] [address], [place], [place] [address]  Labs: BNP (last 3 results) Recent Labs    01/30/21 1316  BNP 81.8   Basic Metabolic Panel: Recent Labs  Lab  01/30/21 1316 01/31/21 0525 02/01/21 0414  NA 141 143 143  K 4.4 3.7 3.8  CL 101 102 101  CO2 31 32 32  GLUCOSE 187* 157* 174*  BUN 35* 37* 37*  CREATININE 1.85* 1.90* 1.91*  CALCIUM 9.3 9.4 9.5   Liver Function Tests: No results for input(s): AST, ALT, ALKPHOS, BILITOT, PROT, ALBUMIN in the last 168 hours. No results for input(s): LIPASE, AMYLASE in the last 168 hours. No results for input(s): AMMONIA in the last 168 hours. CBC: Recent Labs  Lab 01/30/21 1316  WBC 4.2  NEUTROABS 3.1  HGB 11.0*  HCT 33.8*  MCV 93.1  PLT 127*   Cardiac Enzymes: No results for input(s): CKTOTAL, CKMB, CKMBINDEX, TROPONINI in the last 168 hours. BNP: Invalid input(s): POCBNP CBG: Recent Labs  Lab 01/31/21 0711 01/31/21 1154 01/31/21 1622 01/31/21 2204 02/01/21 0732  GLUCAP 161* 155* 210* 190* 184*   D-Dimer No results for input(s): DDIMER in the last 72 hours. Hgb A1c No results for input(s): HGBA1C in the last 72 hours. Lipid Profile No results for input(s): CHOL, HDL, LDLCALC, TRIG, CHOLHDL, LDLDIRECT in the last 72 hours. Thyroid function studies No results for input(s): TSH, T4TOTAL, T3FREE, THYROIDAB in the last 72 hours.  Invalid input(s): FREET3 Anemia work up No results for input(s): VITAMINB12, FOLATE, FERRITIN, TIBC, IRON, RETICCTPCT in the last 72 hours. Urinalysis    Component Value Date/Time   COLORURINE YELLOW 01/08/2021 1407   APPEARANCEUR HAZY (A) 01/08/2021 1407   LABSPEC 1.021 01/08/2021 1407   PHURINE 5.0 01/08/2021 1407   GLUCOSEU >=500 (A) 01/08/2021 1407   HGBUR NEGATIVE 01/08/2021 1407   HGBUR negative 08/05/2010 1523   BILIRUBINUR NEGATIVE 01/08/2021 1407   BILIRUBINUR N 12/15/2016 1059   KETONESUR NEGATIVE 01/08/2021 1407   PROTEINUR >=300 (A) 01/08/2021 1407   UROBILINOGEN 0.2 12/15/2016 1059   UROBILINOGEN 0.2 08/05/2010 1523   NITRITE NEGATIVE 01/08/2021 1407   LEUKOCYTESUR NEGATIVE 01/08/2021 1407   Sepsis Labs Invalid input(s):  PROCALCITONIN,  WBC,  LACTICIDVEN Microbiology Recent Results (from the past 240 hour(s))  Resp Panel by RT-PCR (Flu A&B, Covid) Nasopharyngeal Swab     Status: None   Collection Time: 01/30/21  5:41 PM   Specimen: Nasopharyngeal Swab; Nasopharyngeal(NP) swabs in vial transport medium  Result Value Ref Range Status   SARS Coronavirus 2 by RT PCR NEGATIVE NEGATIVE Final    Comment: (NOTE) SARS-CoV-2 target nucleic acids are NOT DETECTED.  The SARS-CoV-2 RNA is generally detectable in upper respiratory specimens during the acute phase of infection. The lowest concentration of SARS-CoV-2 viral copies this assay can detect is 138 copies/mL. A negative result does not preclude SARS-Cov-2 infection and should not be used as the sole basis for treatment or other patient management decisions. A negative result may occur with  improper specimen collection/handling, submission of specimen other than nasopharyngeal swab, presence of viral mutation(s) within the areas targeted by this assay, and inadequate number of viral copies(<138 copies/mL). A negative result must be combined with clinical observations, patient history, and epidemiological information. The expected result is Negative.  Fact Sheet for Patients:  EntrepreneurPulse.com.au  Fact Sheet for Healthcare Providers:  IncredibleEmployment.be  This test is no t yet approved or cleared by the [place] FDA and  has been authorized for detection and/or diagnosis of SARS-CoV-2 by FDA under an Emergency Use  Authorization (EUA). This EUA will remain  in effect (meaning this test can be used) for the duration of the COVID-19 declaration under Section 564(b)(1) of the Act, 21 U.S.C.section 360bbb-3(b)(1), unless the authorization is terminated  or revoked sooner.       Influenza A by PCR NEGATIVE NEGATIVE Final   Influenza B by PCR NEGATIVE NEGATIVE Final    Comment: (NOTE) The Xpert Xpress  SARS-CoV-2/FLU/RSV plus assay is intended as an aid in the diagnosis of influenza from Nasopharyngeal swab specimens and should not be used as a sole basis for treatment. Nasal washings and aspirates are unacceptable for Xpert Xpress SARS-CoV-2/FLU/RSV testing.  Fact Sheet for Patients: EntrepreneurPulse.com.au  Fact Sheet for Healthcare Providers: IncredibleEmployment.be  This test is not yet approved or cleared by the [place] FDA and has been authorized for detection and/or diagnosis of SARS-CoV-2 by FDA under an Emergency Use Authorization (EUA). This EUA will remain in effect (meaning this test can be used) for the duration of the COVID-19 declaration under Section 564(b)(1) of the Act, 21 U.S.C. section 360bbb-3(b)(1), unless the authorization is terminated or revoked.  Performed at [facility], [place] [address], [place], [place] [address]      Time coordinating discharge: Over 30 minutes  SIGNED:   Darliss Cheney, MD  [facility] 02/01/2021, 9:05 AM  If 7PM-7AM, please contact night-coverage [email or web address]

## 2021-02-04 ENCOUNTER — Telehealth: Payer: Self-pay | Admitting: Family Medicine

## 2021-02-04 NOTE — Telephone Encounter (Signed)
Message sent to PCP as staff is not available at that time.  [name] questioned if the 10:30am time slot would work?

## 2021-02-04 NOTE — Telephone Encounter (Signed)
Ok to add in 1:30 on 6/13 if we have staff for that.

## 2021-02-04 NOTE — Telephone Encounter (Signed)
Pt is calling in stating that she just got out of the hospital and need to be seen within the next 2 weeks pt was discharged from the hospital on Saturday 02/01/2021.  Pt stated that anytime and any day is good for her and she would like to have a call back to let her know when it will be.  Pt is aware that the provider is not in the office today and she does not have anything until July.

## 2021-02-05 ENCOUNTER — Encounter: Payer: Self-pay | Admitting: Pulmonary Disease

## 2021-02-05 NOTE — Telephone Encounter (Signed)
Yep that's perfect.

## 2021-02-05 NOTE — Telephone Encounter (Signed)
Spoke with the patient and she agreed to the appt time as below.  Message sent to [facility] to add the time slot for the same day appt.

## 2021-02-11 ENCOUNTER — Encounter: Payer: Self-pay | Admitting: Family

## 2021-02-11 DIAGNOSIS — M546 Pain in thoracic spine: Secondary | ICD-10-CM | POA: Diagnosis not present

## 2021-02-11 DIAGNOSIS — M9901 Segmental and somatic dysfunction of cervical region: Secondary | ICD-10-CM | POA: Diagnosis not present

## 2021-02-11 DIAGNOSIS — M542 Cervicalgia: Secondary | ICD-10-CM | POA: Diagnosis not present

## 2021-02-11 DIAGNOSIS — M9902 Segmental and somatic dysfunction of thoracic region: Secondary | ICD-10-CM | POA: Diagnosis not present

## 2021-02-13 ENCOUNTER — Ambulatory Visit: Payer: Medicare Other

## 2021-02-13 ENCOUNTER — Other Ambulatory Visit: Payer: Self-pay

## 2021-02-13 DIAGNOSIS — G4733 Obstructive sleep apnea (adult) (pediatric): Secondary | ICD-10-CM

## 2021-02-13 DIAGNOSIS — M542 Cervicalgia: Secondary | ICD-10-CM | POA: Diagnosis not present

## 2021-02-13 DIAGNOSIS — M9902 Segmental and somatic dysfunction of thoracic region: Secondary | ICD-10-CM | POA: Diagnosis not present

## 2021-02-13 DIAGNOSIS — M9901 Segmental and somatic dysfunction of cervical region: Secondary | ICD-10-CM | POA: Diagnosis not present

## 2021-02-13 DIAGNOSIS — M546 Pain in thoracic spine: Secondary | ICD-10-CM | POA: Diagnosis not present

## 2021-02-17 ENCOUNTER — Encounter: Payer: Self-pay | Admitting: Family Medicine

## 2021-02-17 ENCOUNTER — Ambulatory Visit (INDEPENDENT_AMBULATORY_CARE_PROVIDER_SITE_OTHER): Payer: Medicare Other | Admitting: Family Medicine

## 2021-02-17 ENCOUNTER — Other Ambulatory Visit: Payer: Self-pay

## 2021-02-17 VITALS — BP 148/58 | HR 115 | Temp 97.8°F | Ht 61.0 in | Wt 222.8 lb

## 2021-02-17 DIAGNOSIS — R609 Edema, unspecified: Secondary | ICD-10-CM | POA: Diagnosis not present

## 2021-02-17 DIAGNOSIS — N184 Chronic kidney disease, stage 4 (severe): Secondary | ICD-10-CM

## 2021-02-17 DIAGNOSIS — I5033 Acute on chronic diastolic (congestive) heart failure: Secondary | ICD-10-CM | POA: Diagnosis not present

## 2021-02-17 DIAGNOSIS — I10 Essential (primary) hypertension: Secondary | ICD-10-CM | POA: Diagnosis not present

## 2021-02-17 DIAGNOSIS — I872 Venous insufficiency (chronic) (peripheral): Secondary | ICD-10-CM | POA: Diagnosis not present

## 2021-02-17 LAB — COMPREHENSIVE METABOLIC PANEL
ALT: 13 U/L (ref 0–35)
AST: 13 U/L (ref 0–37)
Albumin: 4.2 g/dL (ref 3.5–5.2)
Alkaline Phosphatase: 74 U/L (ref 39–117)
BUN: 39 mg/dL — ABNORMAL HIGH (ref 6–23)
CO2: 30 mEq/L (ref 19–32)
Calcium: 9.7 mg/dL (ref 8.4–10.5)
Chloride: 99 mEq/L (ref 96–112)
Creatinine, Ser: 1.94 mg/dL — ABNORMAL HIGH (ref 0.40–1.20)
GFR: 23.52 mL/min — ABNORMAL LOW (ref >60.00)
Glucose, Bld: 156 mg/dL — ABNORMAL HIGH (ref 70–99)
Potassium: 4.7 mEq/L (ref 3.5–5.1)
Sodium: 139 mEq/L (ref 135–145)
Total Bilirubin: 0.6 mg/dL (ref 0.2–1.2)
Total Protein: 7 g/dL (ref 6.0–8.3)

## 2021-02-17 LAB — CBC WITH DIFFERENTIAL/PLATELET
Basophils Absolute: 0 10*3/uL (ref 0.0–0.1)
Basophils Relative: 0.4 % (ref 0.0–3.0)
Eosinophils Absolute: 0.2 10*3/uL (ref 0.0–0.7)
Eosinophils Relative: 2.6 % (ref 0.0–5.0)
HCT: 34.4 % — ABNORMAL LOW (ref 36.0–46.0)
Hemoglobin: 11.8 g/dL — ABNORMAL LOW (ref 12.0–15.0)
Lymphocytes Relative: 16.1 % (ref 12.0–46.0)
Lymphs Abs: 1.2 10*3/uL (ref 0.7–4.0)
MCHC: 34.3 g/dL (ref 30.0–36.0)
MCV: 88.6 fl (ref 78.0–100.0)
Monocytes Absolute: 0.4 10*3/uL (ref 0.1–1.0)
Monocytes Relative: 5.5 % (ref 3.0–12.0)
Neutro Abs: 5.4 10*3/uL (ref 1.4–7.7)
Neutrophils Relative %: 75.4 % (ref 43.0–77.0)
Platelets: 155 10*3/uL (ref 150.0–400.0)
RBC: 3.89 Mil/uL (ref 3.87–5.11)
RDW: 17.6 % — ABNORMAL HIGH (ref 11.5–15.5)
WBC: 7.1 10*3/uL (ref 4.0–10.5)

## 2021-02-17 LAB — BRAIN NATRIURETIC PEPTIDE: Pro B Natriuretic peptide (BNP): 70 pg/mL (ref 0.0–100.0)

## 2021-02-17 NOTE — Patient Instructions (Addendum)
Ok to take your lasix with the synthroid in the morning Try to take the 40mg  at least 4 days/week. If you are able to do this daily please try and do this.  Stick with your lower salt diet. This is helpful for swelling.  You should get a call from vascular and from wound care.

## 2021-02-17 NOTE — Progress Notes (Addendum)
[name] DOB: [birth year] Encounter date: 02/17/2021  This is a 86 y.o. female who presents with Chief Complaint  Patient presents with   Hospitalization Follow-up    History of present illness:  Doesn't feel as good as she did before. Had 10 lbs of weight taken off during hospital visit. She hasn't followed up with cardiology yet either (has appointment later this month).   Feels out of breath sometimes. Not sure if anxiety. She doesn't always note that heart is racing.   She repeated sleep test a couple of nights ago; awaiting results.   HTN: at home pressures have been doing a little better - she has been taking 7.5mg  amlodipine daily. Pressures have been 140/70 general but up to 160 once in last week. I had advised decreasing amlodipine to 7.5mg  from 10mg  to help with edema, and blood pressure has been doing a little better. Hosp recommended vein specialist for her. She states that her legs feel better, but day before yesterday started feeling tight again. She has been sitting in chair with feet elevated.   She does get a lot of fluid out with the 40mg  lasix; not with the 20mg  (will get a little effect later)  Has been wearing compression stockings daily except last couple of days. Hurt her under knee.   Has been checking sugar at home - less low's than high. Sometimes low early in the morning. Started drinking orange juice - lowest was 50. She does follow with Dr. Renne Crigler.   Allergies  Allergen Reactions   Adhesive [Tape] Other (See Comments)    PULLS OFF THE SKIN   Apixaban Other (See Comments)    Internal Bleeding   Aspirin Itching, Rash, Hives and Swelling    Swelling of her tongue   Mirabegron Other (See Comments)    Patient experienced A-Fib   Pineapple Anaphylaxis and Swelling    Throat swells and blisters on tongue and roof of mouth per patient   Metformin Diarrhea and Nausea Only   Tetracycline Hives   Fluticasone-Salmeterol Itching and Rash    Iodinated Diagnostic Agents Itching and Rash        Lactose Intolerance (Gi) Other (See Comments)    Gas    Latex Itching, Rash and Other (See Comments)    Pulls off the skin and causes welts   Lisinopril Cough   Metronidazole Other (See Comments)    Reaction not known   Sulfa Antibiotics Rash   Sulfonamide Derivatives Itching and Rash   Current Meds  Medication Sig   acetaminophen (TYLENOL) 325 MG tablet Take 650 mg by mouth in the morning and at bedtime.   Amino Acids-Protein Hydrolys (FEEDING SUPPLEMENT, PRO-STAT SUGAR FREE 64,) LIQD Take 30 mLs by mouth 2 (two) times daily. (Patient taking differently: Take 30 mLs by mouth 2 (two) times daily as needed (protein supplement).)   amLODipine (NORVASC) 10 MG tablet Take 1 tablet (10 mg total) by mouth daily.   atorvastatin (LIPITOR) 40 MG tablet Take 40 mg by mouth at bedtime.    BD PEN NEEDLE NANO 2ND GEN 32G X 4 MM MISC USE TO INJECT UP TO FOUR TIMES DAILY AS DIRECTED   bisacodyl (DULCOLAX) 5 MG EC tablet Take 10 mg by mouth every 3 (three) days as needed (for constipation).   Bismuth Tribromoph-Petrolatum (XEROFORM PETROLAT PATCH 4"X4") PADS Apply 1 Units topically daily as needed. (Patient taking differently: Apply 1 Units topically daily as needed (pain).)   Cholecalciferol (VITAMIN D3) 2000 units TABS Take  2,000 Units by mouth daily.   ciprofloxacin (CIPRO) 250 MG tablet Take 1 tablet (250 mg total) by mouth 2 (two) times daily.   Continuous Blood Gluc Sensor (FREESTYLE LIBRE 14 DAY SENSOR) MISC APPLY NEW SENSOR EVERY 14 DAYS AS DIRECTED   fexofenadine (ALLEGRA) 180 MG tablet Take 180 mg by mouth daily.   furosemide (LASIX) 20 MG tablet TAKE 1 TABLET BY MOUTH 4 TIMES EVERY WEEK, ALTERNATING WITH 40 MG THREE TIMES EVERY WEEK   furosemide (LASIX) 40 MG tablet TAKE 1 TABLET BY MOUTH THREE TIMES WEEKLY, ALTERNATING WITH 20MG  FOUR TIMES WEEKLY   HUMALOG KWIKPEN 100 UNIT/ML KwikPen INJECT 13 TO 20 UNITS UNDER THE SKIN THREE TIMES DAILY  WITH BREAKFAST, LUNCH AND EVENING MEAL (Patient taking differently: Inject 13-17 Units into the skin 3 (three) times daily as needed (high blood sugar).)   hyoscyamine (LEVSIN) 0.125 MG tablet Take 1 tablet by mouth daily.   Insulin Syringe-Needle U-100 (INSULIN SYRINGE .3CC/29GX1/2") 29G X 1/2" 0.3 ML MISC Use to inject insulin 4 times a day.   LANTUS SOLOSTAR 100 UNIT/ML Solostar Pen ADMINISTER 50 UNITS UNDER THE SKIN DAILY (Patient taking differently: Inject 46 Units into the skin at bedtime.)   levothyroxine (SYNTHROID) 112 MCG tablet Take 1 tablet (112 mcg total) by mouth daily.   Multiple Vitamin (MULTIVITAMIN) tablet Take 1 tablet by mouth daily.   potassium citrate (UROCIT-K) 10 MEQ (1080 MG) SR tablet Take 20 mEq by mouth in the morning and at bedtime.   predniSONE (DELTASONE) 50 MG tablet Take 1 tablet at 13 hours, 7 hours and 1 hour prior to CT scan.   sertraline (ZOLOFT) 50 MG tablet TAKE 1 TABLET BY MOUTH AT BEDTIME   vitamin B-12 (CYANOCOBALAMIN) 100 MCG tablet Take 100 mcg by mouth daily.   vitamin C (ASCORBIC ACID) 250 MG tablet Take 250 mg by mouth daily.    Review of Systems  Constitutional:  Negative for chills, fatigue and fever.  Respiratory:  Positive for shortness of breath. Negative for cough, chest tightness and wheezing.   Cardiovascular:  Positive for leg swelling. Negative for chest pain and palpitations.  Genitourinary:  Positive for frequency (esp with 40mg  lasix; but doesn't start to urinate until later in day).  Skin:  Positive for color change (lower legs red, swollen, tender).   Objective:  BP (!) 152/80 (BP Location: Left Arm, Patient Position: Sitting, Cuff Size: Large)   Pulse (!) 115   Temp 97.8 F (36.6 C) (Rectal)   Ht 5\' 1"  (1.549 m)   Wt 222 lb 12.8 oz (101.1 kg)   LMP  (LMP Unknown)   SpO2 95%   BMI 42.10 kg/m   Weight: 222 lb 12.8 oz (101.1 kg)   BP Readings from Last 3 Encounters:  02/17/21 (!) 152/80  02/01/21 (!) 167/76  01/17/21 (!)  150/90   Wt Readings from Last 3 Encounters:  02/17/21 222 lb 12.8 oz (101.1 kg)  01/31/21 217 lb 8 oz (98.7 kg)  01/17/21 222 lb (100.7 kg)    Physical Exam Constitutional:      General: She is not in acute distress.    Appearance: She is well-developed.  Cardiovascular:     Rate and Rhythm: Normal rate. Rhythm irregularly irregular.     Heart sounds: Normal heart sounds. No murmur heard.   No friction rub.  Pulmonary:     Effort: Pulmonary effort is normal. No respiratory distress.     Breath sounds: Rales (minimal at bases) present. No wheezing.  Musculoskeletal:     Right lower leg: No edema.     Left lower leg: No edema.  Neurological:     Mental Status: She is alert and oriented to person, place, and time.  Psychiatric:        Behavior: Behavior normal.    Assessment/Plan  1. Venous insufficiency Patient interested in seeing vascular in case they are able to help with recurrent stasis issue. Referral placed. We discussed importance of wearing compression (she currently needs help as she is unable to put on stockings herself), elevation, fluid management. See below - AMB referral to wound care center - Ambulatory referral to Vascular Surgery  2. Essential hypertension Low diastolic today; pressures at home have been stable on current dosing. Keep amlodipine at 7.5mg  daily. This has seemed to help with swelling some.  - CBC with Differential/Platelet; Future - Comprehensive metabolic panel; Future - Comprehensive metabolic panel - CBC with Differential/Platelet  3. Chronic kidney disease (CKD) stage G4/A1, severely decreased glomerular filtration rate (GFR) between 15-29 mL/min/1.73 square meter and albuminuria creatinine ratio less than 30 mg/g (HCC) Follows with nephrology. Encouraged follow up since we are needing to adjust fluid medication.   4. Edema, unspecified type See above. Hoping with wound care they can help with compression wrapping and moisturizing of  lower extremities. I do not feel that there is currently cellulitis; monitor for any worsening edema (or lack of improvement as we step up lasix), erythema.  - Brain natriuretic peptide; Future - Brain natriuretic peptide  5. Acute on chronic diastolic CHF (congestive heart failure) (HCC) Increase lasix to 40mg  4 days per week; 20mg  other days, but if urinary frequency is tolerable; will have her try to do 40mg  daily. She seems to note response with 40mg  rather than 20mg . We discussed taking lasix first thing in the morning to help so she is not up through the night/late evening urinating. In house pharmacy said ok to take synthroid and lasix together.   She has visit set up in September.     45 minutes spent in chart review, exam, charting.     Micheline Rough, MD

## 2021-02-18 DIAGNOSIS — M9901 Segmental and somatic dysfunction of cervical region: Secondary | ICD-10-CM | POA: Diagnosis not present

## 2021-02-18 DIAGNOSIS — M9902 Segmental and somatic dysfunction of thoracic region: Secondary | ICD-10-CM | POA: Diagnosis not present

## 2021-02-18 DIAGNOSIS — M546 Pain in thoracic spine: Secondary | ICD-10-CM | POA: Diagnosis not present

## 2021-02-18 DIAGNOSIS — M542 Cervicalgia: Secondary | ICD-10-CM | POA: Diagnosis not present

## 2021-02-18 MED ORDER — AMLODIPINE BESYLATE 2.5 MG PO TABS
7.5000 mg | ORAL_TABLET | Freq: Every day | ORAL | 1 refills | Status: DC
Start: 2021-02-18 — End: 2021-08-25

## 2021-02-20 DIAGNOSIS — M9902 Segmental and somatic dysfunction of thoracic region: Secondary | ICD-10-CM | POA: Diagnosis not present

## 2021-02-20 DIAGNOSIS — M546 Pain in thoracic spine: Secondary | ICD-10-CM | POA: Diagnosis not present

## 2021-02-20 DIAGNOSIS — M542 Cervicalgia: Secondary | ICD-10-CM | POA: Diagnosis not present

## 2021-02-20 DIAGNOSIS — M9901 Segmental and somatic dysfunction of cervical region: Secondary | ICD-10-CM | POA: Diagnosis not present

## 2021-02-21 ENCOUNTER — Telehealth: Payer: Self-pay | Admitting: Pulmonary Disease

## 2021-02-21 ENCOUNTER — Encounter: Payer: Self-pay | Admitting: Family

## 2021-02-21 DIAGNOSIS — G4733 Obstructive sleep apnea (adult) (pediatric): Secondary | ICD-10-CM | POA: Diagnosis not present

## 2021-02-21 NOTE — Telephone Encounter (Signed)
  HST showed mod OSA with AHI 15/ hr Suggest she get back on her CPAP machine  Since oxygen drop was significant, suggest CPAP titration study in our sleep center

## 2021-02-24 ENCOUNTER — Other Ambulatory Visit: Payer: Self-pay | Admitting: Internal Medicine

## 2021-02-24 DIAGNOSIS — N184 Chronic kidney disease, stage 4 (severe): Secondary | ICD-10-CM

## 2021-02-24 DIAGNOSIS — Z794 Long term (current) use of insulin: Secondary | ICD-10-CM

## 2021-02-24 NOTE — Telephone Encounter (Signed)
Tried calling the pt and there was no answer and no option to leave msg. Will call back. CPAP titration order pending until speak to pt.

## 2021-02-25 DIAGNOSIS — M546 Pain in thoracic spine: Secondary | ICD-10-CM | POA: Diagnosis not present

## 2021-02-25 DIAGNOSIS — M9901 Segmental and somatic dysfunction of cervical region: Secondary | ICD-10-CM | POA: Diagnosis not present

## 2021-02-25 DIAGNOSIS — M542 Cervicalgia: Secondary | ICD-10-CM | POA: Diagnosis not present

## 2021-02-25 DIAGNOSIS — M9902 Segmental and somatic dysfunction of thoracic region: Secondary | ICD-10-CM | POA: Diagnosis not present

## 2021-02-25 NOTE — Telephone Encounter (Signed)
Called home number and again, no answer or VM  Called her mobile and had to [facility]- will try again tomorrow and mail letter if still no response.

## 2021-02-27 ENCOUNTER — Other Ambulatory Visit: Payer: Self-pay | Admitting: Internal Medicine

## 2021-02-27 ENCOUNTER — Telehealth: Payer: Self-pay | Admitting: Pulmonary Disease

## 2021-02-27 ENCOUNTER — Encounter: Payer: Self-pay | Admitting: *Deleted

## 2021-02-27 ENCOUNTER — Other Ambulatory Visit: Payer: Self-pay

## 2021-02-27 DIAGNOSIS — G4733 Obstructive sleep apnea (adult) (pediatric): Secondary | ICD-10-CM

## 2021-02-27 DIAGNOSIS — Z794 Long term (current) use of insulin: Secondary | ICD-10-CM

## 2021-02-27 DIAGNOSIS — N184 Chronic kidney disease, stage 4 (severe): Secondary | ICD-10-CM

## 2021-02-27 NOTE — Telephone Encounter (Signed)
HST showed mod OSA with AHI 15/ hr Suggest she get back on her CPAP machine   Since oxygen drop was significant, suggest CPAP titration study in our sleep center  Patient is aware of results and voiced her understanding.  She is agreeable with titration study. Order has been placed. Nothing further is needed.

## 2021-02-27 NOTE — Telephone Encounter (Signed)
Patents daughter called to check status of refill request for pen needles - Patient only has one pen needle left and uses 4 per day.

## 2021-02-27 NOTE — Telephone Encounter (Signed)
Please see 02/27/2021 phone note.

## 2021-02-27 NOTE — Telephone Encounter (Signed)
Rx has been sent to pharmacy

## 2021-03-03 ENCOUNTER — Other Ambulatory Visit: Payer: Self-pay

## 2021-03-03 DIAGNOSIS — Z9189 Other specified personal risk factors, not elsewhere classified: Secondary | ICD-10-CM

## 2021-03-03 DIAGNOSIS — Z862 Personal history of diseases of the blood and blood-forming organs and certain disorders involving the immune mechanism: Secondary | ICD-10-CM | POA: Insufficient documentation

## 2021-03-03 DIAGNOSIS — Z8659 Personal history of other mental and behavioral disorders: Secondary | ICD-10-CM | POA: Insufficient documentation

## 2021-03-03 DIAGNOSIS — I872 Venous insufficiency (chronic) (peripheral): Secondary | ICD-10-CM

## 2021-03-03 HISTORY — DX: Other specified personal risk factors, not elsewhere classified: Z91.89

## 2021-03-04 ENCOUNTER — Encounter (HOSPITAL_COMMUNITY): Payer: Medicare Other

## 2021-03-04 DIAGNOSIS — M542 Cervicalgia: Secondary | ICD-10-CM | POA: Diagnosis not present

## 2021-03-04 DIAGNOSIS — M9902 Segmental and somatic dysfunction of thoracic region: Secondary | ICD-10-CM | POA: Diagnosis not present

## 2021-03-04 DIAGNOSIS — M546 Pain in thoracic spine: Secondary | ICD-10-CM | POA: Diagnosis not present

## 2021-03-04 DIAGNOSIS — M9901 Segmental and somatic dysfunction of cervical region: Secondary | ICD-10-CM | POA: Diagnosis not present

## 2021-03-05 ENCOUNTER — Other Ambulatory Visit: Payer: Self-pay

## 2021-03-05 ENCOUNTER — Encounter: Payer: Self-pay | Admitting: Physician Assistant

## 2021-03-05 ENCOUNTER — Encounter: Payer: Self-pay | Admitting: Family

## 2021-03-05 ENCOUNTER — Ambulatory Visit: Payer: Medicare Other | Admitting: Physician Assistant

## 2021-03-05 VITALS — BP 130/70 | HR 115 | Ht 61.0 in | Wt 224.8 lb

## 2021-03-05 DIAGNOSIS — I10 Essential (primary) hypertension: Secondary | ICD-10-CM | POA: Diagnosis not present

## 2021-03-05 DIAGNOSIS — I484 Atypical atrial flutter: Secondary | ICD-10-CM

## 2021-03-05 DIAGNOSIS — D696 Thrombocytopenia, unspecified: Secondary | ICD-10-CM | POA: Diagnosis not present

## 2021-03-05 DIAGNOSIS — I5032 Chronic diastolic (congestive) heart failure: Secondary | ICD-10-CM | POA: Diagnosis not present

## 2021-03-05 MED ORDER — METOPROLOL SUCCINATE ER 25 MG PO TB24
25.0000 mg | ORAL_TABLET | Freq: Every day | ORAL | 3 refills | Status: DC
Start: 2021-03-05 — End: 2021-05-01

## 2021-03-05 NOTE — Progress Notes (Signed)
Cardiology Office Note    Date:  03/05/2021   ID:  Sara, [name] 01/15/1935, MRN [id number]   PCP:  Caren Macadam, MD   [place]  Cardiologist:  [name], MD   Advanced Practice Provider:  No care team member to display Electrophysiologist:  None   [id number]}   Chief Complaint  Patient presents with   Hospitalization Follow-up     History of Present Illness:  [name] is a 86 y.o. female with history of hypertension, atrial flutter, PE not on anticoagulation due to thrombocytopenia and history of bleeding of unknown source, chronic diastolic CHF.  Discharged from the hospital 02/01/2021 after admission with acute on chronic CHF and hypertensive urgency.  Amlodipine was added and increased to 10 mg daily and she was diuresed.  2D echo 01/31/2021 LVEF 60 to 65% with moderate LVH.  Patient comes in for f/u accompanied by her daughter. Her HR has been racing and she is very short of breath with activity. BP doing well. Weight up 4 lbs.amlodipine decreased by PCP 7.5 mg because of leg swelling. Seeing a vein specialist and wound center. Asking if she'd be a candidate for watchman.   Lymph node swelling in neck came up yesterday-no fever-recommend she f/u with PCP-appt made tomorrow.    Past Medical History:  Diagnosis Date   Allergic rhinitis    Anxiety state, unspecified    panic attacks   CHF (congestive heart failure) (HCC)    Depressive disorder, not elsewhere classified    Extrinsic asthma, unspecified    no problem since adulthood   History of complications due to general anesthesia 03/03/2021   Obesity    OSA on CPAP    severe   Pneumonitis 07/14/2019   Pressure injury of skin 07/10/2019   Pulmonary embolism ([place])    Pure hypercholesterolemia    Respiratory failure with hypoxia ([place]) 09/2008   acute, secondary to multiple bilateral pulmonary embolism , negative hypercoagulable workup 09/2008 hospital stay    Scoliosis    Type II or unspecified type diabetes mellitus without mention of complication, not stated as uncontrolled    Unspecified hypothyroidism    hypo    Past Surgical History:  Procedure Laterality Date   CARDIOVERSION N/A 06/26/2019   Procedure: CARDIOVERSION;  Surgeon: Jerline Pain, MD;  Location: [facility];  Service: Cardiovascular;  Laterality: N/A;   EYE SURGERY Bilateral 2005   cataracts, implants   KIDNEY STONE SURGERY Bilateral    2016, 2017    knee replaced Left 2013   THYROID SURGERY  1966   nodule removal; partial thyroidectomy; radioactive iodine x2; 1990's    Current Medications: Current Meds  Medication Sig   acetaminophen (TYLENOL) 325 MG tablet Take 650 mg by mouth in the morning and at bedtime.   Amino Acids-Protein Hydrolys (FEEDING SUPPLEMENT, PRO-STAT SUGAR FREE 64,) LIQD Take 30 mLs by mouth 2 (two) times daily.   amLODipine (NORVASC) 2.5 MG tablet Take 3 tablets (7.5 mg total) by mouth daily.   amoxicillin (AMOXIL) 500 MG tablet 500 mg as needed.   atorvastatin (LIPITOR) 40 MG tablet Take 40 mg by mouth at bedtime.    BD PEN NEEDLE NANO 2ND GEN 32G X 4 MM MISC USE TO INJECT UP TO FOUR TIMES DAILY AS DIRECTED   bisacodyl (DULCOLAX) 5 MG EC tablet Take 10 mg by mouth every 3 (three) days as needed (for constipation).   Bismuth Tribromoph-Petrolatum (XEROFORM PETROLAT PATCH 4"X4") PADS  Apply 1 Units topically daily as needed.   Cholecalciferol (VITAMIN D3) 2000 units TABS Take 2,000 Units by mouth daily.   Continuous Blood Gluc Sensor (FREESTYLE LIBRE 14 DAY SENSOR) MISC APPLY NEW SENSOR EVERY 14 DAYS AS DIRECTED   fexofenadine (ALLEGRA) 180 MG tablet Take 180 mg by mouth daily.   furosemide (LASIX) 20 MG tablet TAKE 1 TABLET BY MOUTH 4 TIMES EVERY WEEK, ALTERNATING WITH 40 MG THREE TIMES EVERY WEEK   furosemide (LASIX) 40 MG tablet TAKE 1 TABLET BY MOUTH THREE TIMES WEEKLY, ALTERNATING WITH 20MG  FOUR TIMES WEEKLY   HUMALOG KWIKPEN 100 UNIT/ML KwikPen  INJECT 13 TO 20 UNITS UNDER THE SKIN THREE TIMES DAILY WITH BREAKFAST, LUNCH AND EVENING MEAL   hyoscyamine (LEVSIN) 0.125 MG tablet Take 1 tablet by mouth daily.   Insulin Syringe-Needle U-100 (INSULIN SYRINGE .3CC/29GX1/2") 29G X 1/2" 0.3 ML MISC Use to inject insulin 4 times a day.   LANTUS SOLOSTAR 100 UNIT/ML Solostar Pen ADMINISTER 50 UNITS UNDER THE SKIN DAILY   levothyroxine (SYNTHROID) 112 MCG tablet Take 1 tablet (112 mcg total) by mouth daily.   metoprolol succinate (TOPROL XL) 25 MG 24 hr tablet Take 1 tablet (25 mg total) by mouth daily.   Multiple Vitamin (MULTIVITAMIN) tablet Take 1 tablet by mouth daily.   potassium citrate (UROCIT-K) 10 MEQ (1080 MG) SR tablet Take 20 mEq by mouth in the morning and at bedtime.   sertraline (ZOLOFT) 50 MG tablet TAKE 1 TABLET BY MOUTH AT BEDTIME   vitamin B-12 (CYANOCOBALAMIN) 100 MCG tablet Take 100 mcg by mouth daily.   vitamin C (ASCORBIC ACID) 250 MG tablet Take 250 mg by mouth daily.     Allergies:   Adhesive [tape], Apixaban, Aspirin, Mirabegron, Pineapple, Metformin, Tetracycline, Fluticasone-salmeterol, Iodinated diagnostic agents, Lactose intolerance (gi), Latex, Lisinopril, Metronidazole, Sulfa antibiotics, and Sulfonamide derivatives   Social History   Socioeconomic History   Marital status: Widowed    Spouse name: Not on file   Number of children: 2   Years of education: Not on file   Highest education level: Not on file  Occupational History   Occupation: OWNER    Employer: [employer]    Comment: Self employed- runs Advertising copywriter  Tobacco Use   Smoking status: Former    Packs/day: 0.25    Years: 20.00    Pack years: 5.00    Types: Cigarettes    Quit date: 09/07/1989    Years since quitting: 31.5   Smokeless tobacco: Never  Vaping Use   Vaping Use: Never used  Substance and Sexual Activity   Alcohol use: No   Drug use: No   Sexual activity: Not Currently  Other Topics Concern   Not on file  Social History  Narrative   Lives at [place] Determinants of Health   Financial Resource Strain: Not on file  Food Insecurity: Not on file  Transportation Needs: Not on file  Physical Activity: Not on file  Stress: Not on file  Social Connections: Not on file     Family History:  The patient's  family history includes Allergies in her mother; Arthritis in her daughter; Asthma in her mother; Clotting disorder in her maternal grandmother, maternal uncle, and mother; Coronary artery disease in an other family member; Depression in her maternal grandfather; Diabetes in her daughter and paternal grandmother; High Cholesterol in her daughter; High blood pressure in her daughter; Hyperlipidemia in her father; Hypertension in her father and mother; Osteoarthritis in  her mother; Rheum arthritis in her maternal grandmother; Stroke in her mother and another family member.   ROS:   Please see the history of present illness.    ROS All other systems reviewed and are negative.   PHYSICAL EXAM:   VS:  BP 130/70   Pulse (!) 115   Ht 5\' 1"  (1.549 m)   Wt 224 lb 12.8 oz (102 kg)   LMP  (LMP Unknown)   SpO2 94%   BMI 42.48 kg/m   Physical Exam  GEN: Obese,, in no acute distress  Neck: no JVD, carotid bruits, or masses Cardiac:RRR; no murmurs, rubs, or gallops  Respiratory:  clear to auscultation bilaterally, normal work of breathing GI: soft, nontender, nondistended, + BS Ext: wearing compression hose, legs are tight with edema,  Good distal pulses bilaterally  Neuro:  Alert and Oriented x 3,  Psych: euthymic mood, full affect  Wt Readings from Last 3 Encounters:  03/05/21 224 lb 12.8 oz (102 kg)  02/17/21 222 lb 12.8 oz (101.1 kg)  01/31/21 217 lb 8 oz (98.7 kg)      Studies/Labs Reviewed:   EKG:  EKG is not ordered today.    Recent Labs: 01/17/2021: TSH 0.742 01/30/2021: B Natriuretic Peptide 90.0 02/17/2021: ALT 13; BUN 39; Creatinine, Ser 1.94; Hemoglobin 11.8; Platelets 155.0;  Potassium 4.7; Pro B Natriuretic peptide (BNP) 70.0; Sodium 139   Lipid Panel    Component Value Date/Time   CHOL 141 08/14/2020 1017   CHOL 168 07/25/2020 0000   TRIG 334 (H) 08/14/2020 1017   HDL 27 (L) 08/14/2020 1017   HDL 29 (L) 07/25/2020 0000   CHOLHDL 5.2 (H) 08/14/2020 1017   VLDL 118.2 (H) 09/25/2011 1511   LDLCALC 74 08/14/2020 1017   LDLDIRECT 41.0 05/19/2018 1140    Additional studies/ records that were reviewed today include:  Echo 01/31/21 IMPRESSIONS     1. Left ventricular ejection fraction, by estimation, is 60 to 65%. The  left ventricle has normal function. The left ventricle has no regional  wall motion abnormalities. There is moderate concentric left ventricular  hypertrophy. Left ventricular  diastolic function could not be evaluated.   2. Right ventricular systolic function is normal. The right ventricular  size is normal. There is normal pulmonary artery systolic pressure. The  estimated right ventricular systolic pressure is 59.5 mmHg.   3. Left atrial size was moderately dilated.   4. Right atrial size was mildly dilated.   5. A small pericardial effusion is present. The pericardial effusion is  posterior to the left ventricle.   6. The mitral valve is degenerative. Mild mitral valve regurgitation. No  evidence of mitral stenosis.   7. The aortic valve is tricuspid. There is mild calcification of the  aortic valve. There is mild thickening of the aortic valve. Aortic valve  regurgitation is not visualized. Mild aortic valve sclerosis is present,  with no evidence of aortic valve  stenosis.   8. The inferior vena cava is normal in size with greater than 50%  respiratory variability, suggesting right atrial pressure of 3 mmHg.   Comparison(s): Changes from prior study are noted. The left ventricular  function has improved.    Risk Assessment/Calculations:    CHA2DS2-VASc Score = 5  This indicates a 7.2% annual risk of stroke. The patient's  score is based upon: CHF History: Yes HTN History: Yes Diabetes History: No Stroke History: No Vascular Disease History: No Age Score: 2 Gender Score: 1  ASSESSMENT:    1. Atypical atrial flutter ([place])   2. Essential hypertension   3. Chronic diastolic CHF (congestive heart failure) ([place])   4. Thrombocytopenia (HCC)      PLAN:  In order of problems listed above:  Persistent atrial flutter not on anticoagulation due to thrombocytopenia and followed closely by Dr. Marin Olp.  Recent trouble with fast heart rates in atrial flutter.  Has not been on rate lowering medications in several years.  Most likely contributing to her recent hospitalizations with CHF.  Discussed with Dr. Rayann Heman in detail.  He recommends starting Toprol-XL 25 mg once daily.  Follow-up in A. fib clinic next week for medication titration.  He feels she is very high risk for watchman given her thrombocytopenia, CKD, weight, and age.  HTN recent hypertensive urgency started on amlodipine.  Blood pressure better controlled and amlodipine decreased to 7.5 mg daily because of edema  Chronic diastolic CHF with recent hospitalization for acute exacerbation.  Weight up 4 pounds in Lasix suggested by PCP taking 40 mg 1 day alternating with 30 mg other days.  Creatinine 1.94.  She is watching her sodium intake.  Recent echo normal LVEF with moderate LVH.  Can take 40 mg daily if needed for weight gain or edema  Thrombocytopenia followed by Dr. Marin Olp  Shared Decision Making/Informed Consent        Medication Adjustments/Labs and Tests Ordered: Current medicines are reviewed at length with the patient today.  Concerns regarding medicines are outlined above.  Medication changes, Labs and Tests ordered today are listed in the Patient Instructions below. Patient Instructions  Medication Instructions:  Your physician has recommended you make the following change in your medication:   START: Metoprolol Succinate 25mg   daily You may take an additional Furosemide as needed for weight gain >2-3lbs overnight.   *If you need a refill on your cardiac medications before your next appointment, please call your pharmacy*   Lab Work: None If you have labs (blood work) drawn today and your tests are completely normal, you will receive your results only by: [place] (if you have MyChart) OR A paper copy in the mail If you have any lab test that is abnormal or we need to change your treatment, we will call you to review the results.   Follow-Up: At [facility], you and your health needs are our priority.  As part of our continuing mission to provide you with exceptional heart care, we have created designated Provider Care Teams.  These Care Teams include your primary Cardiologist (physician) and Advanced Practice Providers (APPs -  Physician Assistants and Nurse Practitioners) who all work together to provide you with the care you need, when you need it.   Your next appointment:   1 week(s)  The format for your next appointment:   In Person  Provider:   You will follow up in the [facility] located at [facility]. Your provider will be: Clint R. Fenton, PA-C or Roderic Palau, NP   AFIB CLINIC INFORMATION: Your appointment is scheduled on:          at                  . Please arrive 15 minutes early for check-in. The AFib Clinic is located in the Heart and Vascular Specialty Clinics at [facility] Parking instructions/directions: [address] (off [address]). When you pull in to Entrance C, there is an underground parking garage to your  right. The code to enter the garage is 3342. Take the elevators to the first floor. Follow the signs to the Heart and Vascular Specialty Clinics. You will see registration at the end of the hallway.  Phone number: [phone]   Signed, Ermalinda Barrios, PA-C  03/05/2021 2:51 PM    [facility] [place], [place], [address]  [address] Phone: [phone]; Fax: [phone]

## 2021-03-05 NOTE — Patient Instructions (Addendum)
Medication Instructions:  Your physician has recommended you make the following change in your medication:   START: Metoprolol Succinate 25mg  daily You may take an additional Furosemide as needed for weight gain >2-3lbs overnight.   *If you need a refill on your cardiac medications before your next appointment, please call your pharmacy*   Lab Work: None If you have labs (blood work) drawn today and your tests are completely normal, you will receive your results only by: [place] (if you have MyChart) OR A paper copy in the mail If you have any lab test that is abnormal or we need to change your treatment, we will call you to review the results.   Follow-Up: At [facility], you and your health needs are our priority.  As part of our continuing mission to provide you with exceptional heart care, we have created designated Provider Care Teams.  These Care Teams include your primary Cardiologist (physician) and Advanced Practice Providers (APPs -  Physician Assistants and Nurse Practitioners) who all work together to provide you with the care you need, when you need it.   Your next appointment:   1 week(s)  The format for your next appointment:   In Person  Provider:   You will follow up in the [facility] located at [facility]. Your provider will be: Clint R. Fenton, PA-C or Roderic Palau, NP   AFIB CLINIC INFORMATION: Your appointment is scheduled on:          at                  . Please arrive 15 minutes early for check-in. The AFib Clinic is located in the Heart and Vascular Specialty Clinics at [facility]. Parking instructions/directions: [address] (off [address]). When you pull in to Entrance C, there is an underground parking garage to your right. The code to enter the garage is 3342. Take the elevators to the first floor. Follow the signs to the Heart and Vascular Specialty Clinics. You will see registration at the end of the  hallway.  Phone number: [phone]

## 2021-03-06 ENCOUNTER — Encounter: Payer: Self-pay | Admitting: Family Medicine

## 2021-03-06 ENCOUNTER — Ambulatory Visit (INDEPENDENT_AMBULATORY_CARE_PROVIDER_SITE_OTHER): Payer: Medicare Other | Admitting: Family Medicine

## 2021-03-06 ENCOUNTER — Ambulatory Visit (INDEPENDENT_AMBULATORY_CARE_PROVIDER_SITE_OTHER)
Admission: RE | Admit: 2021-03-06 | Discharge: 2021-03-06 | Disposition: A | Discharge status: Home / Self Care | Payer: Medicare Other | Source: Ambulatory Visit | Attending: Family Medicine | Admitting: Family Medicine

## 2021-03-06 VITALS — BP 140/78 | HR 79 | Temp 97.7°F | Wt 223.4 lb

## 2021-03-06 DIAGNOSIS — R590 Localized enlarged lymph nodes: Secondary | ICD-10-CM

## 2021-03-06 DIAGNOSIS — R591 Generalized enlarged lymph nodes: Secondary | ICD-10-CM | POA: Diagnosis not present

## 2021-03-06 DIAGNOSIS — R490 Dysphonia: Secondary | ICD-10-CM

## 2021-03-06 DIAGNOSIS — I6523 Occlusion and stenosis of bilateral carotid arteries: Secondary | ICD-10-CM | POA: Diagnosis not present

## 2021-03-06 DIAGNOSIS — R221 Localized swelling, mass and lump, neck: Secondary | ICD-10-CM

## 2021-03-06 DIAGNOSIS — M40202 Unspecified kyphosis, cervical region: Secondary | ICD-10-CM | POA: Diagnosis not present

## 2021-03-06 DIAGNOSIS — K115 Sialolithiasis: Secondary | ICD-10-CM | POA: Diagnosis not present

## 2021-03-06 MED ORDER — ATORVASTATIN CALCIUM 40 MG PO TABS: 40.0000 mg | ORAL_TABLET | Freq: Every day | ORAL | 0 refills | Status: DC

## 2021-03-06 NOTE — Progress Notes (Signed)
Subjective:    Patient ID: [name], female    DOB: March 02, 1935, 86 y.o.   MRN: [id number]  Chief Complaint  Patient presents with   Facial Swelling    left  Pt accompanied by her daughter.  HPI Patient was seen today for acute concern.  Pt with L sided neck edema and enlarging mass.  Started a few days ago. Causing voice to be hoarse.  Feels like "going in ear" causing L ear pain.  Pt denies sore throat, cough, facial pain/ pressure, dental pain.  Had similar symptoms many yrs ago when in NH.  Given abx which resolved symptoms.  Taking sublingual B12.    Patient is a former smoker per chart review, quit in 09/07/1989, 1/4 pk x 20 yrs.  Past Medical History:  Diagnosis Date   Allergic rhinitis    Anxiety state, unspecified    panic attacks   CHF (congestive heart failure) ([place])    Depressive disorder, not elsewhere classified    Extrinsic asthma, unspecified    no problem since adulthood   History of complications due to general anesthesia 03/03/2021   Obesity    OSA on CPAP    severe   Pneumonitis 07/14/2019   Pressure injury of skin 07/10/2019   Pulmonary embolism ([place])    Pure hypercholesterolemia    Respiratory failure with hypoxia ([place]) 09/2008   acute, secondary to multiple bilateral pulmonary embolism , negative hypercoagulable workup 09/2008 hospital stay   Scoliosis    Type II or unspecified type diabetes mellitus without mention of complication, not stated as uncontrolled    Unspecified hypothyroidism    hypo    Allergies  Allergen Reactions   Adhesive [Tape] Other (See Comments)    PULLS OFF THE SKIN   Apixaban Other (See Comments)    Internal Bleeding   Aspirin Itching, Rash, Hives and Swelling    Swelling of her tongue   Mirabegron Other (See Comments)    Patient experienced A-Fib   Pineapple Anaphylaxis and Swelling    Throat swells and blisters on tongue and roof of mouth per patient   Metformin Diarrhea and Nausea Only   Tetracycline Hives    Fluticasone-Salmeterol Itching and Rash   Iodinated Diagnostic Agents Itching and Rash        Lactose Intolerance (Gi) Other (See Comments)    Gas    Latex Itching, Rash and Other (See Comments)    Pulls off the skin and causes welts   Lisinopril Cough   Metronidazole Other (See Comments)    Reaction not known   Sulfa Antibiotics Rash   Sulfonamide Derivatives Itching and Rash    ROS General: Denies fever, chills, night sweats, changes in weight, changes in appetite HEENT: Denies headaches, ear pain, changes in vision, rhinorrhea, sore throat   +hoarse voice CV: Denies CP, palpitations, SOB, orthopnea Pulm: Denies SOB, cough, wheezing GI: Denies abdominal pain, nausea, vomiting, diarrhea, constipation GU: Denies dysuria, hematuria, frequency, vaginal discharge Msk: Denies muscle cramps, joint pains  +L sided neck swelling Neuro: Denies weakness, numbness, tingling Skin: Denies rashes, bruising Psych: Denies depression, anxiety, hallucinations     Objective:    Blood pressure 140/78, pulse 79, temperature 97.7 F (36.5 C), temperature source Oral, weight 223 lb 6.4 oz (101.3 kg), SpO2 95 %.  Gen. Pleasant, well-nourished, in no distress, normal affect   HEENT: [place]/AT, face symmetric, conjunctiva clear, no scleral icterus, PERRLA, EOMI, nares patent without drainage, tongue normal in size, pharynx without erythema or exudate.  TMs normal b/l.   L canal with mild erythema distally.  2.5 cm round mass in L anterior neck.  Cervical lymphadenopathy.  No carries.   Neck: No JVD, no thyromegaly, no carotid bruits Lungs: no accessory muscle use Cardiovascular: RRR, no peripheral edema Musculoskeletal: No deformities, no cyanosis or clubbing, normal tone Neuro:  A&Ox3, CN II-XII intact, ambulating with walker with walker. Skin:  Warm, no lesions/ rash  Wt Readings from Last 3 Encounters:  03/06/21 223 lb 6.4 oz (101.3 kg)  03/05/21 224 lb 12.8 oz (102 kg)  02/17/21 222 lb 12.8 oz  (101.1 kg)    Lab Results  Component Value Date   WBC 7.1 02/17/2021   HGB 11.8 (L) 02/17/2021   HCT 34.4 (L) 02/17/2021   PLT 155.0 02/17/2021   GLUCOSE 156 (H) 02/17/2021   CHOL 141 08/14/2020   TRIG 334 (H) 08/14/2020   HDL 27 (L) 08/14/2020   LDLDIRECT 41.0 05/19/2018   LDLCALC 74 08/14/2020   ALT 13 02/17/2021   AST 13 02/17/2021   NA 139 02/17/2021   K 4.7 02/17/2021   CL 99 02/17/2021   CREATININE 1.94 (H) 02/17/2021   BUN 39 (H) 02/17/2021   CO2 30 02/17/2021   TSH 0.742 01/17/2021   INR 1.4 (H) 07/03/2019   HGBA1C 7.1 (A) 01/17/2021   MICROALBUR 17.3 (H) 09/11/2015    Assessment/Plan:  Cervical lymphadenopathy - Plan: CT SOFT TISSUE NECK WO CONTRAST  Localized swelling, mass and lump, neck - Plan: CT SOFT TISSUE NECK WO CONTRAST  Hoarseness of voice - Plan: CT SOFT TISSUE NECK WO CONTRAST  Discussed possible causes of enlargement in the left neck including enlarged lymph node, infection in submandibular gland, thyroglossal duct cyst.  Must consider superficial or pharyngeal abscess given hoarse voice.  Discussed obtaining imaging to further evaluate.  Order for CT soft tissue neck placed.  Patient to have imaging done this afternoon.  We will send an antibiotic such as azithromycin if needed based on results.  Numerous allergies reviewed.  Given strict precautions  F/u prn  Grier Mitts, MD

## 2021-03-07 ENCOUNTER — Telehealth: Payer: Self-pay | Admitting: Family Medicine

## 2021-03-07 NOTE — Telephone Encounter (Signed)
Patient's daughter [name] called back to check status of original message.  Told [name] that messages are addressed in the order that they are received and in between patients.  [name] would like to know if an antibiotic could be sent in for mother because she states that she would like her to start that ASAP.  Please advise.

## 2021-03-07 NOTE — Telephone Encounter (Signed)
Patient's daughter [name] called stating that the results from her mother's CT scan were back and wants to discuss next steps for her mother.  She says that Dr.Banks ordered the CT for her and would like to speak to her medical assistant about it.  [name]'s number is [phone].  Please advise.

## 2021-03-11 ENCOUNTER — Ambulatory Visit: Payer: Medicare Other

## 2021-03-11 ENCOUNTER — Other Ambulatory Visit: Payer: Medicare Other

## 2021-03-11 ENCOUNTER — Encounter (HOSPITAL_BASED_OUTPATIENT_CLINIC_OR_DEPARTMENT_OTHER): Payer: Medicare Other | Attending: Internal Medicine | Admitting: Internal Medicine

## 2021-03-11 ENCOUNTER — Other Ambulatory Visit: Payer: Self-pay

## 2021-03-11 ENCOUNTER — Ambulatory Visit: Payer: Medicare Other | Admitting: Hematology & Oncology

## 2021-03-11 DIAGNOSIS — E119 Type 2 diabetes mellitus without complications: Secondary | ICD-10-CM | POA: Diagnosis not present

## 2021-03-11 DIAGNOSIS — E1169 Type 2 diabetes mellitus with other specified complication: Secondary | ICD-10-CM

## 2021-03-11 DIAGNOSIS — I5042 Chronic combined systolic (congestive) and diastolic (congestive) heart failure: Secondary | ICD-10-CM

## 2021-03-11 DIAGNOSIS — E1151 Type 2 diabetes mellitus with diabetic peripheral angiopathy without gangrene: Secondary | ICD-10-CM | POA: Diagnosis not present

## 2021-03-11 DIAGNOSIS — Z882 Allergy status to sulfonamides status: Secondary | ICD-10-CM | POA: Insufficient documentation

## 2021-03-11 DIAGNOSIS — Z86711 Personal history of pulmonary embolism: Secondary | ICD-10-CM | POA: Diagnosis not present

## 2021-03-11 DIAGNOSIS — Z881 Allergy status to other antibiotic agents status: Secondary | ICD-10-CM | POA: Insufficient documentation

## 2021-03-11 DIAGNOSIS — I872 Venous insufficiency (chronic) (peripheral): Secondary | ICD-10-CM | POA: Insufficient documentation

## 2021-03-11 DIAGNOSIS — I87313 Chronic venous hypertension (idiopathic) with ulcer of bilateral lower extremity: Secondary | ICD-10-CM

## 2021-03-11 DIAGNOSIS — I130 Hypertensive heart and chronic kidney disease with heart failure and stage 1 through stage 4 chronic kidney disease, or unspecified chronic kidney disease: Secondary | ICD-10-CM | POA: Diagnosis not present

## 2021-03-11 DIAGNOSIS — I5032 Chronic diastolic (congestive) heart failure: Secondary | ICD-10-CM | POA: Insufficient documentation

## 2021-03-11 DIAGNOSIS — Z87891 Personal history of nicotine dependence: Secondary | ICD-10-CM | POA: Insufficient documentation

## 2021-03-11 DIAGNOSIS — Z886 Allergy status to analgesic agent status: Secondary | ICD-10-CM | POA: Diagnosis not present

## 2021-03-11 DIAGNOSIS — I10 Essential (primary) hypertension: Secondary | ICD-10-CM | POA: Diagnosis not present

## 2021-03-11 DIAGNOSIS — I890 Lymphedema, not elsewhere classified: Secondary | ICD-10-CM | POA: Insufficient documentation

## 2021-03-11 DIAGNOSIS — Z888 Allergy status to other drugs, medicaments and biological substances status: Secondary | ICD-10-CM | POA: Diagnosis not present

## 2021-03-11 DIAGNOSIS — I2510 Atherosclerotic heart disease of native coronary artery without angina pectoris: Secondary | ICD-10-CM | POA: Insufficient documentation

## 2021-03-11 DIAGNOSIS — E1122 Type 2 diabetes mellitus with diabetic chronic kidney disease: Secondary | ICD-10-CM | POA: Insufficient documentation

## 2021-03-11 DIAGNOSIS — E1140 Type 2 diabetes mellitus with diabetic neuropathy, unspecified: Secondary | ICD-10-CM | POA: Diagnosis not present

## 2021-03-11 DIAGNOSIS — N181 Chronic kidney disease, stage 1: Secondary | ICD-10-CM | POA: Diagnosis not present

## 2021-03-11 NOTE — Progress Notes (Signed)
[name], [name] ([id number]) Visit Report for 03/11/2021 Abuse/Suicide Risk Screen Details Patient Name: Date of Service: [name], [name] NICE J. 03/11/2021 10:30 A M Medical Record Number: [id number] Patient Account Number: [id number] Date of Birth/Sex: Treating RN: 1935/01/05 (86 y.o. [name] Primary Care Corban Kistler: Micheline Rough Other Clinician: Referring Ciarrah Rae: Treating Eulalie Speights/Extender: Ladonna Snide Weeks in Treatment: 0 Abuse/Suicide Risk Screen Items Answer ABUSE RISK SCREEN: Has anyone close to you tried to hurt or harm you recentlyo No Do you feel uncomfortable with anyone in your familyo No Has anyone forced you do things that you didnt want to doo No Electronic Signature(s) Signed: 03/11/2021 7:10:16 PM By: Baruch Gouty RN, BSN Entered By: Baruch Gouty on 03/11/2021 12:01:27 -------------------------------------------------------------------------------- Activities of Daily Living Details Patient Name: Date of Service: [name], [name] NICE J. 03/11/2021 10:30 A M Medical Record Number: [id number] Patient Account Number: [id number] Date of Birth/Sex: Treating RN: 1935-07-06 (86 y.o. [name] Primary Care Aviv Rota: Micheline Rough Other Clinician: Referring Toluwani Yadav: Treating Breeanna Galgano/Extender: Ladonna Snide Weeks in Treatment: 0 Activities of Daily Living Items Answer Activities of Daily Living (Please select one for each item) Drive Automobile Completely Able T Medications ake Need Assistance Use T elephone Completely Able Care for Appearance Need Assistance Use T oilet Completely Able Bath / Shower Need Assistance Dress Self Need Assistance Feed Self Completely Able Walk Need Assistance Get In / Out Bed Completely Able Housework Need Assistance Prepare Meals Completely [place] for Self Need Assistance Electronic Signature(s) Signed: 03/11/2021 7:10:16 PM By:  Baruch Gouty RN, BSN Entered By: Baruch Gouty on 03/11/2021 12:02:56 -------------------------------------------------------------------------------- Education Screening Details Patient Name: Date of Service: [name] NICE J. 03/11/2021 10:30 A M Medical Record Number: [id number] Patient Account Number: [id number] Date of Birth/Sex: Treating RN: [birth year] (86 y.o. [name] Primary Care Shekela Goodridge: Micheline Rough Other Clinician: Referring Jozee Hammer: Treating Lizbett Garciagarcia/Extender: Lucie Leather in Treatment: 0 Primary Learner Assessed: Patient Learning Preferences/Education Level/Primary Language Learning Preference: Explanation, Demonstration, Printed Material Highest Education Level: College or Above Preferred Language: English Cognitive Barrier Language Barrier: No Translator Needed: No Memory Deficit: No Emotional Barrier: No Cultural/Religious Beliefs Affecting Medical Care: No Physical Barrier Impaired Vision: Yes Glasses, reading Impaired Hearing: No Decreased Hand dexterity: No Knowledge/Comprehension Knowledge Level: High Comprehension Level: High Ability to understand written instructions: High Ability to understand verbal instructions: High Motivation Anxiety Level: Calm Cooperation: Cooperative Education Importance: Acknowledges Need Interest in Health Problems: Asks Questions Perception: Coherent Willingness to Engage in Self-Management High Activities: Readiness to Engage in Self-Management High Activities: Electronic Signature(s) Signed: 03/11/2021 7:10:16 PM By: Baruch Gouty RN, BSN Entered By: Baruch Gouty on 03/11/2021 12:03:28 -------------------------------------------------------------------------------- Fall Risk Assessment Details Patient Name: Date of Service: [name] NICE J. 03/11/2021 10:30 A M Medical Record Number: [id number] Patient Account Number: [id number] Date of Birth/Sex: Treating  RN: 1934/12/22 (85 y.o. [name] Primary Care Arath Kaigler: Micheline Rough Other Clinician: Referring Miriah Maruyama: Treating Dajia Gunnels/Extender: Ladonna Snide Weeks in Treatment: 0 Fall Risk Assessment Items Have you had 2 or more falls in the last 12 monthso 0 No Have you had any fall that resulted in injury in the last 12 monthso 0 No FALLS RISK SCREEN History of falling - immediate or within 3 months 0 No Secondary diagnosis (Do you have 2 or more medical diagnoseso) 0 No Ambulatory aid None/bed rest/wheelchair/nurse 0 No Crutches/cane/walker 15 Yes Furniture 0 No Intravenous therapy Access/Saline/Heparin Lock 0 No Gait/Transferring Normal/ bed rest/  wheelchair 0 No Weak (short steps with or without shuffle, stooped but able to lift head while walking, may seek 10 Yes support from furniture) Impaired (short steps with shuffle, may have difficulty arising from chair, head down, impaired 0 No balance) Mental Status Oriented to own ability 0 Yes Electronic Signature(s) Signed: 03/11/2021 7:10:16 PM By: Baruch Gouty RN, BSN Entered By: Baruch Gouty on 03/11/2021 12:03:46 -------------------------------------------------------------------------------- Foot Assessment Details Patient Name: Date of Service: [name] NICE J. 03/11/2021 10:30 A M Medical Record Number: [id number] Patient Account Number: [id number] Date of Birth/Sex: Treating RN: [birth year] (86 y.o. [name] Primary Care Punam Broussard: Micheline Rough Other Clinician: Referring Paola Flynt: Treating Suella Cogar/Extender: Ladonna Snide Weeks in Treatment: 0 Foot Assessment Items Site Locations + = Sensation present, - = Sensation absent, C = Callus, U = Ulcer R = Redness, W = Warmth, M = Maceration, PU = Pre-ulcerative lesion F = Fissure, S = Swelling, D = Dryness Assessment Right: Left: Other Deformity: No No Prior Foot Ulcer: No No Prior Amputation: No  No Charcot Joint: No No Ambulatory Status: Ambulatory With Help Assistance Device: Walker Gait: Steady Electronic Signature(s) Signed: 03/11/2021 7:10:16 PM By: Baruch Gouty RN, BSN Entered By: Baruch Gouty on 03/11/2021 12:07:46 -------------------------------------------------------------------------------- Nutrition Risk Screening Details Patient Name: Date of Service: [name] NICE J. 03/11/2021 10:30 A M Medical Record Number: [id number] Patient Account Number: [id number] Date of Birth/Sex: Treating RN: May 06, 1935 (86 y.o. [name] Primary Care Marte Celani: Micheline Rough Other Clinician: Referring Bethenny Losee: Treating Ceonna Frazzini/Extender: Ladonna Snide Weeks in Treatment: 0 Height (in): 61 Weight (lbs): 222 Body Mass Index (BMI): 41.9 Nutrition Risk Screening Items Score Screening NUTRITION RISK SCREEN: I have an illness or condition that made me change the kind and/or amount of food I eat 0 No I eat fewer than two meals per day 0 No I eat few fruits and vegetables, or milk products 0 No I have three or more drinks of beer, liquor or wine almost every day 0 No I have tooth or mouth problems that make it hard for me to eat 0 No I don't always have enough money to buy the food I need 0 No I eat alone most of the time 0 No I take three or more different prescribed or over-the-counter drugs a day 1 Yes Without wanting to, I have lost or gained 10 pounds in the last six months 0 No I am not always physically able to shop, cook and/or feed myself 0 No Nutrition Protocols Good Risk Protocol 0 No interventions needed Moderate Risk Protocol High Risk Proctocol Risk Level: Good Risk Score: 1 Electronic Signature(s) Signed: 03/11/2021 7:10:16 PM By: Baruch Gouty RN, BSN Entered By: Baruch Gouty on 03/11/2021 12:04:25

## 2021-03-11 NOTE — Progress Notes (Addendum)
[name], [name] ([id number]) Visit Report for 03/11/2021 Allergy List Details Patient Name: Date of Service: [name], Sara NICE J. 03/11/2021 10:30 A M Medical Record Number: [id number] Patient Account Number: [id number] Date of Birth/Sex: Treating RN: 11-03-1934 (86 y.o. Female) Baruch Gouty Primary Care Dalexa Gentz: Micheline Rough Other Clinician: Referring Shaunice Levitan: Treating Gricel Copen/Extender: Ladonna Snide Weeks in Treatment: 0 Allergies Active Allergies adhesive tape apixaban aspirin Reaction: rash, hives mirabegron pineapple Reaction: anaphylaxis metformin Reaction: diarrhea tetracycline Reaction: hives fluticasone Reaction: rash Iodinated Contrast Media Reaction: rash lactose Reaction: intolerance latex Reaction: rash lisinopril Reaction: cough metronidazole Sulfa (Sulfonamide Antibiotics) Reaction: rash Allergy Notes Electronic Signature(s) Signed: 03/11/2021 7:10:16 PM By: Baruch Gouty RN, BSN Entered By: Baruch Gouty on 03/11/2021 11:24:45 -------------------------------------------------------------------------------- Arrival Information Details Patient Name: Date of Service: Sara Antu NICE J. 03/11/2021 10:30 A M Medical Record Number: [id number] Patient Account Number: [id number] Date of Birth/Sex: Treating RN: 03/06/1935 (86 y.o. Female) Baruch Gouty Primary Care Houda Brau: Micheline Rough Other Clinician: Referring Jewelz Ricklefs: Treating Vickie Ponds/Extender: Ladonna Snide Weeks in Treatment: 0 Visit Information Patient Arrived: Walker Arrival Time: 11:37 Accompanied By: daughter Transfer Assistance: None Patient Identification Verified: Yes Secondary Verification Process Completed: Yes Patient Requires Transmission-Based Precautions: No Patient Has Alerts: No Electronic Signature(s) Signed: 03/11/2021 7:10:16 PM By: Baruch Gouty RN, BSN Entered By: Baruch Gouty on 03/11/2021  11:43:23 -------------------------------------------------------------------------------- Clinic Level of Care Assessment Details Patient Name: Date of Service: Sara Antu NICE J. 03/11/2021 10:30 A M Medical Record Number: [id number] Patient Account Number: [id number] Date of Birth/Sex: Treating RN: 01-16-1935 (86 y.o. Female) Baruch Gouty Primary Care Darlen Gledhill: Micheline Rough Other Clinician: Referring Kasumi Ditullio: Treating Charl Wellen/Extender: Ladonna Snide Weeks in Treatment: 0 Clinic Level of Care Assessment Items TOOL 2 Quantity Score []  - 0 Use when only an EandM is performed on the INITIAL visit ASSESSMENTS - Nursing Assessment / Reassessment X- 1 20 General Physical Exam (combine w/ comprehensive assessment (listed just below) when performed on new pt. evals) X- 1 25 Comprehensive Assessment (HX, ROS, Risk Assessments, Wounds Hx, etc.) ASSESSMENTS - Wound and Skin A ssessment / Reassessment []  - 0 Simple Wound Assessment / Reassessment - one wound []  - 0 Complex Wound Assessment / Reassessment - multiple wounds X- 1 10 Dermatologic / Skin Assessment (not related to wound area) ASSESSMENTS - Ostomy and/or Continence Assessment and Care []  - 0 Incontinence Assessment and Management []  - 0 Ostomy Care Assessment and Management (repouching, etc.) PROCESS - Coordination of Care X - Simple Patient / Family Education for ongoing care 1 15 []  - 0 Complex (extensive) Patient / Family Education for ongoing care X- 1 10 Staff obtains Programmer, systems, Records, T Results / Process Orders est []  - 0 Staff telephones HHA, Nursing Homes / Clarify orders / etc []  - 0 Routine Transfer to another Facility (non-emergent condition) []  - 0 Routine Hospital Admission (non-emergent condition) X- 1 15 New Admissions / Biomedical engineer / Ordering NPWT Apligraf, etc. , []  - 0 Emergency Hospital Admission (emergent condition) X- 1 10 Simple Discharge  Coordination []  - 0 Complex (extensive) Discharge Coordination PROCESS - Special Needs []  - 0 Pediatric / Minor Patient Management []  - 0 Isolation Patient Management []  - 0 Hearing / Language / Visual special needs []  - 0 Assessment of Community assistance (transportation, D/C planning, etc.) []  - 0 Additional assistance / Altered mentation []  - 0 Support Surface(s) Assessment (bed, cushion, seat, etc.) INTERVENTIONS - Wound Cleansing / Measurement []  - 0 Wound Imaging (photographs - any number of  wounds) []  - 0 Wound Tracing (instead of photographs) []  - 0 Simple Wound Measurement - one wound []  - 0 Complex Wound Measurement - multiple wounds []  - 0 Simple Wound Cleansing - one wound []  - 0 Complex Wound Cleansing - multiple wounds INTERVENTIONS - Wound Dressings []  - 0 Small Wound Dressing one or multiple wounds []  - 0 Medium Wound Dressing one or multiple wounds []  - 0 Large Wound Dressing one or multiple wounds []  - 0 Application of Medications - injection INTERVENTIONS - Miscellaneous []  - 0 External ear exam []  - 0 Specimen Collection (cultures, biopsies, blood, body fluids, etc.) []  - 0 Specimen(s) / Culture(s) sent or taken to Lab for analysis []  - 0 Patient Transfer (multiple staff / Civil Service fast streamer / Similar devices) []  - 0 Simple Staple / Suture removal (25 or less) []  - 0 Complex Staple / Suture removal (26 or more) []  - 0 Hypo / Hyperglycemic Management (close monitor of Blood Glucose) []  - 0 Ankle / Brachial Index (ABI) - do not check if billed separately Has the patient been seen at the hospital within the last three years: Yes Total Score: 105 Level Of Care: New/Established - Level 3 Electronic Signature(s) Signed: 03/11/2021 7:10:16 PM By: Baruch Gouty RN, BSN Entered By: Baruch Gouty on 03/11/2021 14:38:58 -------------------------------------------------------------------------------- Encounter Discharge Information Details Patient  Name: Date of Service: Sara Antu NICE J. 03/11/2021 10:30 A M Medical Record Number: [id number] Patient Account Number: [id number] Date of Birth/Sex: Treating RN: 20-Mar-1935 (86 y.o. Female) Baruch Gouty Primary Care Naziyah Tieszen: Micheline Rough Other Clinician: Referring Allon Costlow: Treating Ilena Dieckman/Extender: Ladonna Snide Weeks in Treatment: 0 Encounter Discharge Information Items Discharge Condition: Stable Ambulatory Status: Walker Discharge Destination: Home Transportation: Private Auto Accompanied By: daughter Schedule Follow-up Appointment: Yes Clinical Summary of Care: Patient Declined Electronic Signature(s) Signed: 03/11/2021 7:10:16 PM By: Baruch Gouty RN, BSN Entered By: Baruch Gouty on 03/11/2021 12:29:20 -------------------------------------------------------------------------------- Lower Extremity Assessment Details Patient Name: Date of Service: Sara Antu NICE J. 03/11/2021 10:30 A M Medical Record Number: [id number] Patient Account Number: [id number] Date of Birth/Sex: Treating RN: Nov 21, 1934 (86 y.o. Female) Baruch Gouty Primary Care Ayde Record: Micheline Rough Other Clinician: Referring Rie Mcneil: Treating Nia Nathaniel/Extender: Ladonna Snide Weeks in Treatment: 0 Edema Assessment Assessed: [Left: No] [Right: No] E[Left: dema] [Right: :] Calf Left: Right: Point of Measurement: From Medial Instep 42.5 cm 41.3 cm Ankle Left: Right: Point of Measurement: From Medial Instep 24.5 cm 24 cm Knee To Floor Left: Right: From Medial Instep 37 cm 37 cm Vascular Assessment Pulses: Dorsalis Pedis Palpable: [Left:Yes] [Right:Yes] Electronic Signature(s) Signed: 03/11/2021 7:10:16 PM By: Baruch Gouty RN, BSN Entered By: Baruch Gouty on 03/11/2021 12:27:10 -------------------------------------------------------------------------------- Multi Wound Chart Details Patient Name: Date of Service: Sara Antu NICE J.  03/11/2021 10:30 A M Medical Record Number: [id number] Patient Account Number: [id number] Date of Birth/Sex: Treating RN: [birth year] (86 y.o. Female) Deon Pilling Primary Care Jasmin Winberry: Micheline Rough Other Clinician: Referring Azaria Bartell: Treating Ganon Demasi/Extender: Ladonna Snide Weeks in Treatment: 0 Vital Signs Height(in): 61 Pulse(bpm): 72 Weight(lbs): 222 Blood Pressure(mmHg): 148/76 Body Mass Index(BMI): 42 Temperature(F): 97.8 Respiratory Rate(breaths/min): 18 Wound Assessments Treatment Notes Electronic Signature(s) Signed: 03/16/2021 8:35:20 PM By: Kalman Shan DO Signed: 03/17/2021 5:17:45 PM By: Deon Pilling Entered By: Kalman Shan on 03/16/2021 20:17:08 -------------------------------------------------------------------------------- Pain Assessment Details Patient Name: Date of Service: Sara Antu NICE J. 03/11/2021 10:30 A M Medical Record Number: [id number] Patient Account Number: [id number] Date of Birth/Sex: Treating RN: Jul 19, 1935 (86 y.o. Female) Baruch Gouty  Primary Care Kerstyn Coryell: Micheline Rough Other Clinician: Referring Molly Maselli: Treating Oaklie Durrett/Extender: Ladonna Snide Weeks in Treatment: 0 Active Problems Location of Pain Severity and Description of Pain Patient Has Paino No Site Locations Rate the pain. Current Pain Level: 0 Pain Management and Medication Current Pain Management: Electronic Signature(s) Signed: 03/11/2021 7:10:16 PM By: Baruch Gouty RN, BSN Entered By: Baruch Gouty on 03/11/2021 12:09:13 -------------------------------------------------------------------------------- Patient/Caregiver Education Details Patient Name: Date of Service: Sara Antu NICE Lenna Sciara 7/5/2022andnbsp10:30 A M Medical Record Number: [id number] Patient Account Number: [id number] Date of Birth/Gender: Treating RN: 02-16-1935 (85 y.o. Female) Baruch Gouty Primary Care Physician: Micheline Rough Other Clinician: Referring Physician: Treating Physician/Extender: Lucie Leather in Treatment: 0 Education Assessment Education Provided To: Patient Education Topics Provided Venous: Handouts: Controlling Swelling with Compression Stockings Methods: Explain/Verbal, Printed Responses: Reinforcements needed, State content correctly Wound/Skin Impairment: Handouts: Caring for Your Ulcer, Skin Care Do's and Dont's Methods: Explain/Verbal, Printed Responses: Reinforcements needed, State content correctly Electronic Signature(s) Signed: 03/11/2021 7:10:16 PM By: Baruch Gouty RN, BSN Entered By: Baruch Gouty on 03/11/2021 12:19:32 -------------------------------------------------------------------------------- [place] Details Patient Name: Date of Service: Sara Antu NICE J. 03/11/2021 10:30 A M Medical Record Number: [id number] Patient Account Number: [id number] Date of Birth/Sex: Treating RN: February 16, 1935 (86 y.o. Female) Baruch Gouty Primary Care Malayah Demuro: Micheline Rough Other Clinician: Referring Fransico Sciandra: Treating Ayron Fillinger/Extender: Ladonna Snide Weeks in Treatment: 0 Vital Signs Time Taken: 11:43 Temperature (F): 97.8 Height (in): 61 Pulse (bpm): 72 Source: Stated Respiratory Rate (breaths/min): 18 Weight (lbs): 222 Blood Pressure (mmHg): 148/76 Source: Stated Reference Range: 80 - 120 mg / dl Body Mass Index (BMI): 41.9 Notes has libre system to monitor blood glucose Electronic Signature(s) Signed: 03/11/2021 7:10:16 PM By: Baruch Gouty RN, BSN Entered By: Baruch Gouty on 03/11/2021 11:44:31

## 2021-03-12 ENCOUNTER — Telehealth: Payer: Self-pay | Admitting: Pulmonary Disease

## 2021-03-12 ENCOUNTER — Encounter (HOSPITAL_COMMUNITY): Payer: Medicare Other

## 2021-03-12 NOTE — Telephone Encounter (Signed)
Call returned to patient daughter, able to confirm patients DOB. Patient daughter wanted to know results of HST. Given per below:    Voiced understanding. Appt for Cpap titration study has already been made. Aware we will f/u once titration study has been completed. Voiced understanding.   Nothing further needed at this time.

## 2021-03-12 NOTE — Telephone Encounter (Signed)
Left a message for [name] to return my call.

## 2021-03-12 NOTE — Telephone Encounter (Signed)
Apologize this was sent to me when out of the office and patient originally seen/imaging ordered by Dr. Volanda Napoleon.   How is [name] feeling now? There was/is a stone blocking the gland. Sometimes these relieve on their own. If no improvement may need to see specialist and I can place referral. I am happy to send in antibiotic if she has not had improvement in symptoms. Please find out what else she has done in meanwhile if anything to help with symptoms.

## 2021-03-13 ENCOUNTER — Other Ambulatory Visit: Payer: Self-pay | Admitting: *Deleted

## 2021-03-13 ENCOUNTER — Other Ambulatory Visit: Payer: Self-pay

## 2021-03-13 ENCOUNTER — Inpatient Hospital Stay: Payer: Medicare Other

## 2021-03-13 ENCOUNTER — Encounter: Payer: Self-pay | Admitting: Hematology & Oncology

## 2021-03-13 ENCOUNTER — Telehealth: Payer: Self-pay | Admitting: Pulmonary Disease

## 2021-03-13 ENCOUNTER — Inpatient Hospital Stay: Payer: Medicare Other | Admitting: Hematology & Oncology

## 2021-03-13 ENCOUNTER — Inpatient Hospital Stay: Payer: Medicare Other | Attending: Hematology & Oncology

## 2021-03-13 VITALS — BP 161/56 | HR 77 | Temp 98.2°F | Resp 20 | Wt 224.0 lb

## 2021-03-13 DIAGNOSIS — D509 Iron deficiency anemia, unspecified: Secondary | ICD-10-CM | POA: Insufficient documentation

## 2021-03-13 DIAGNOSIS — I4891 Unspecified atrial fibrillation: Secondary | ICD-10-CM | POA: Diagnosis not present

## 2021-03-13 DIAGNOSIS — R7981 Abnormal blood-gas level: Secondary | ICD-10-CM

## 2021-03-13 DIAGNOSIS — R0602 Shortness of breath: Secondary | ICD-10-CM

## 2021-03-13 DIAGNOSIS — D693 Immune thrombocytopenic purpura: Secondary | ICD-10-CM | POA: Insufficient documentation

## 2021-03-13 DIAGNOSIS — Z86711 Personal history of pulmonary embolism: Secondary | ICD-10-CM | POA: Insufficient documentation

## 2021-03-13 DIAGNOSIS — D500 Iron deficiency anemia secondary to blood loss (chronic): Secondary | ICD-10-CM

## 2021-03-13 LAB — CMP (CANCER CENTER ONLY)
ALT: 11 U/L (ref 0–44)
AST: 11 U/L — ABNORMAL LOW (ref 15–41)
Albumin: 4.3 g/dL (ref 3.5–5.0)
Alkaline Phosphatase: 68 U/L (ref 38–126)
Anion gap: 8 (ref 5–15)
BUN: 61 mg/dL — ABNORMAL HIGH (ref 8–23)
CO2: 30 mmol/L (ref 22–32)
Calcium: 10.1 mg/dL (ref 8.9–10.3)
Chloride: 99 mmol/L (ref 98–111)
Creatinine: 2.2 mg/dL — ABNORMAL HIGH (ref 0.44–1.00)
GFR, Estimated: 22 mL/min — ABNORMAL LOW (ref >60)
Glucose, Bld: 197 mg/dL — ABNORMAL HIGH (ref 70–99)
Potassium: 4.3 mmol/L (ref 3.5–5.1)
Sodium: 137 mmol/L (ref 135–145)
Total Bilirubin: 0.6 mg/dL (ref 0.3–1.2)
Total Protein: 7.1 g/dL (ref 6.5–8.1)

## 2021-03-13 LAB — CBC WITH DIFFERENTIAL (CANCER CENTER ONLY)
Abs Immature Granulocytes: 0.03 10*3/uL (ref 0.00–0.07)
Basophils Absolute: 0 10*3/uL (ref 0.0–0.1)
Basophils Relative: 0 %
Eosinophils Absolute: 0.2 10*3/uL (ref 0.0–0.5)
Eosinophils Relative: 3 %
HCT: 34.8 % — ABNORMAL LOW (ref 36.0–46.0)
Hemoglobin: 11 g/dL — ABNORMAL LOW (ref 12.0–15.0)
Immature Granulocytes: 1 %
Lymphocytes Relative: 18 %
Lymphs Abs: 1.1 10*3/uL (ref 0.7–4.0)
MCH: 30 pg (ref 26.0–34.0)
MCHC: 31.6 g/dL (ref 30.0–36.0)
MCV: 94.8 fL (ref 80.0–100.0)
Monocytes Absolute: 0.3 10*3/uL (ref 0.1–1.0)
Monocytes Relative: 6 %
Neutro Abs: 4.4 10*3/uL (ref 1.7–7.7)
Neutrophils Relative %: 72 %
Platelet Count: 134 10*3/uL — ABNORMAL LOW (ref 150–400)
RBC: 3.67 MIL/uL — ABNORMAL LOW (ref 3.87–5.11)
RDW: 17.2 % — ABNORMAL HIGH (ref 11.5–15.5)
WBC Count: 6 10*3/uL (ref 4.0–10.5)
nRBC: 0 % (ref 0.0–0.2)

## 2021-03-13 NOTE — Telephone Encounter (Signed)
ATC left voicemail on daughter's phone to return call to office

## 2021-03-13 NOTE — Progress Notes (Signed)
Did not need injection today per Dr. Marin Olp.

## 2021-03-13 NOTE — Telephone Encounter (Signed)
Spoke with pt's daughter (per DPR) who states that pt's O2 saturation drop during ambulation into the 80's. Daughter states that pt seems to recover quickly after resting. Daughter is requesting O2 orders for during the day as well as at night. O2 ordering process was explained to pr's daughter but there is no available appointments in office until 04/07/21. Please advise.

## 2021-03-13 NOTE — Progress Notes (Signed)
Hematology and Oncology Follow Up Visit  [name] [id number] 01/23/1935 86 y.o. 03/13/2021   Principle Diagnosis:  Immune based thrombocytopenia History of PE Iron deficiency anemia Atrial fib   Current Therapy:        Nplate q 3 week for platelet count < 100K  IV Iron as indicated    Interim History:  [name] is here today with her daughter.  She is short of breath.  She comes in without any oxygen.  She clearly needs oxygen at home.  With walking down the hall, her oxygen saturation was only 84%.  We put on 2 L oxygen.  Her oxygen saturation went up to 93%.  I am not sure why she does not have oxygen at home.  I think she would benefit from oxygen.  Actually she sees the pulmonologist in a few weeks.  Sounds like she will have a formal pulmonary function test done.  Otherwise, she is doing okay.  Last time we saw her, she had a VQ scan which did not show any evidence of pulmonary embolism.  She has had no problems with bleeding.  Is been no problems with nausea or vomiting.  She has had no cough.  She has some leg swelling.  This has been chronic.  We have not had to give her Nplate for quite a while.   She has atrial fibrillation.  She wants to have a Watchman device placed.  However she was told that she is not a candidate for this.  Her last iron studies back in May showed a ferritin of 399 with an iron saturation of 19%.    I would have to say that her performance status right now is probably ECOG 2.     Medications:  Allergies as of 03/13/2021       Reactions   Adhesive [tape] Other (See Comments)   PULLS OFF THE SKIN   Apixaban Other (See Comments)   Internal Bleeding   Aspirin Itching, Rash, Hives, Swelling   Swelling of her tongue   Mirabegron Other (See Comments)   Patient experienced A-Fib   Pineapple Anaphylaxis, Swelling   Throat swells and blisters on tongue and roof of mouth per patient   Metformin Diarrhea, Nausea Only   Tetracycline  Hives   Fluticasone-salmeterol Itching, Rash   Iodinated Diagnostic Agents Itching, Rash      Lactose Intolerance (gi) Other (See Comments)   Gas   Latex Itching, Rash, Other (See Comments)   Pulls off the skin and causes welts   Lisinopril Cough   Metronidazole Other (See Comments)   Reaction not known   Sulfa Antibiotics Rash   Sulfonamide Derivatives Itching, Rash        Medication List        Accurate as of March 13, 2021  1:56 PM. If you have any questions, ask your nurse or doctor.          STOP taking these medications    feeding supplement (PRO-STAT SUGAR FREE 64) Liqd Stopped by: Volanda Napoleon, MD       TAKE these medications    acetaminophen 325 MG tablet Commonly known as: TYLENOL Take 650 mg by mouth in the morning and at bedtime.   amLODipine 2.5 MG tablet Commonly known as: NORVASC Take 3 tablets (7.5 mg total) by mouth daily.   amoxicillin 500 MG tablet Commonly known as: AMOXIL 500 mg as needed.   atorvastatin 40 MG tablet Commonly known as: LIPITOR Take  1 tablet (40 mg total) by mouth at bedtime.   BD Pen Needle Nano 2nd Gen 32G X 4 MM Misc Generic drug: Insulin Pen Needle USE TO INJECT UP TO FOUR TIMES DAILY AS DIRECTED   bisacodyl 5 MG EC tablet Commonly known as: DULCOLAX Take 10 mg by mouth every 3 (three) days as needed (for constipation).   fexofenadine 180 MG tablet Commonly known as: ALLEGRA Take 180 mg by mouth daily.   FreeStyle Libre 14 Day Sensor Misc APPLY NEW SENSOR EVERY 14 DAYS AS DIRECTED   furosemide 40 MG tablet Commonly known as: LASIX TAKE 1 TABLET BY MOUTH THREE TIMES WEEKLY, ALTERNATING WITH 20MG  FOUR TIMES WEEKLY   furosemide 20 MG tablet Commonly known as: LASIX TAKE 1 TABLET BY MOUTH 4 TIMES EVERY WEEK, ALTERNATING WITH 40 MG THREE TIMES EVERY WEEK   HumaLOG KwikPen 100 UNIT/ML KwikPen Generic drug: insulin lispro INJECT 13 TO 20 UNITS UNDER THE SKIN THREE TIMES DAILY WITH BREAKFAST, LUNCH AND  EVENING MEAL   hyoscyamine 0.125 MG tablet Commonly known as: LEVSIN Take 1 tablet by mouth daily.   INSULIN SYRINGE .3CC/29GX1/2" 29G X 1/2" 0.3 ML Misc Use to inject insulin 4 times a day.   Lantus SoloStar 100 UNIT/ML Solostar Pen Generic drug: insulin glargine ADMINISTER 50 UNITS UNDER THE SKIN DAILY   levothyroxine 112 MCG tablet Commonly known as: SYNTHROID Take 1 tablet (112 mcg total) by mouth daily.   metoprolol succinate 25 MG 24 hr tablet Commonly known as: Toprol XL Take 1 tablet (25 mg total) by mouth daily.   multivitamin tablet Take 1 tablet by mouth daily.   potassium citrate 10 MEQ (1080 MG) SR tablet Commonly known as: UROCIT-K Take 20 mEq by mouth in the morning and at bedtime.   sertraline 50 MG tablet Commonly known as: ZOLOFT TAKE 1 TABLET BY MOUTH AT BEDTIME   vitamin B-12 100 MCG tablet Commonly known as: CYANOCOBALAMIN Take 100 mcg by mouth daily.   vitamin C 250 MG tablet Commonly known as: ASCORBIC ACID Take 250 mg by mouth daily.   Vitamin D3 50 MCG (2000 UT) Tabs Take 2,000 Units by mouth daily.   Xeroform Petrolat Patch 4"x4" Pads Apply 1 Units topically daily as needed.        Allergies:  Allergies  Allergen Reactions   Adhesive [Tape] Other (See Comments)    PULLS OFF THE SKIN   Apixaban Other (See Comments)    Internal Bleeding   Aspirin Itching, Rash, Hives and Swelling    Swelling of her tongue   Mirabegron Other (See Comments)    Patient experienced A-Fib   Pineapple Anaphylaxis and Swelling    Throat swells and blisters on tongue and roof of mouth per patient   Metformin Diarrhea and Nausea Only   Tetracycline Hives   Fluticasone-Salmeterol Itching and Rash   Iodinated Diagnostic Agents Itching and Rash        Lactose Intolerance (Gi) Other (See Comments)    Gas    Latex Itching, Rash and Other (See Comments)    Pulls off the skin and causes welts   Lisinopril Cough   Metronidazole Other (See Comments)     Reaction not known   Sulfa Antibiotics Rash   Sulfonamide Derivatives Itching and Rash    Past Medical History, Surgical history, Social history, and Family History were reviewed and updated.  Review of Systems: All other 10 point review of systems is negative.   Physical Exam:  weight is 224  lb (101.6 kg). Her oral temperature is 98.2 F (36.8 C). Her blood pressure is 161/56 (abnormal) and her pulse is 77. Her respiration is 20 and oxygen saturation is 99%.   Wt Readings from Last 3 Encounters:  03/13/21 224 lb (101.6 kg)  03/06/21 223 lb 6.4 oz (101.3 kg)  03/05/21 224 lb 12.8 oz (102 kg)    Ocular: Sclerae unicteric, pupils equal, round and reactive to light Ear-nose-throat: Oropharynx clear, dentition fair Lymphatic: No cervical or supraclavicular adenopathy Lungs no rales or rhonchi, good excursion bilaterally Heart regular rate and rhythm, no murmur appreciated Abd soft, nontender, positive bowel sounds MSK no focal spinal tenderness, no joint edema Neuro: non-focal, well-oriented, appropriate affect Breasts: Deferred   Lab Results  Component Value Date   WBC 6.0 03/13/2021   HGB 11.0 (L) 03/13/2021   HCT 34.8 (L) 03/13/2021   MCV 94.8 03/13/2021   PLT 134 (L) 03/13/2021   Lab Results  Component Value Date   FERRITIN 399 (H) 01/17/2021   IRON 60 01/17/2021   TIBC 329 01/07/2021   UIBC 268 01/07/2021   IRONPCTSAT 19.0 (L) 01/17/2021   Lab Results  Component Value Date   RETICCTPCT 2.8 07/05/2019   RBC 3.67 (L) 03/13/2021   RETICCTABS 73.3 01/28/2011   No results found for: KPAFRELGTCHN, LAMBDASER, KAPLAMBRATIO No results found for: IGGSERUM, IGA, IGMSERUM No results found for: Kathrynn Ducking, MSPIKE, SPEI   Chemistry      Component Value Date/Time   NA 137 03/13/2021 1206   NA 145 09/08/2017 1317   NA 142 02/25/2017 1013   K 4.3 03/13/2021 1206   K 4.5 09/08/2017 1317   K 3.8 02/25/2017 1013   CL 99  03/13/2021 1206   CL 98 09/08/2017 1317   CO2 30 03/13/2021 1206   CO2 32 09/08/2017 1317   CO2 26 02/25/2017 1013   BUN 61 (H) 03/13/2021 1206   BUN 34 (H) 09/08/2017 1317   BUN 20.1 02/25/2017 1013   CREATININE 2.20 (H) 03/13/2021 1206   CREATININE 1.98 (H) 01/17/2021 1601   CREATININE 1.8 (H) 02/25/2017 1013      Component Value Date/Time   CALCIUM 10.1 03/13/2021 1206   CALCIUM 9.3 09/08/2017 1317   CALCIUM 8.7 02/25/2017 1013   ALKPHOS 68 03/13/2021 1206   ALKPHOS 45 09/08/2017 1317   ALKPHOS 65 02/25/2017 1013   AST 11 (L) 03/13/2021 1206   AST 16 02/25/2017 1013   ALT 11 03/13/2021 1206   ALT 26 09/08/2017 1317   ALT 13 02/25/2017 1013   BILITOT 0.6 03/13/2021 1206   BILITOT 0.76 02/25/2017 1013       Impression and Plan: Ms. Gomm is a very pleasant 86 yo caucasian female with immune based thrombocytopenia.   At this point, the ITP really is not a problem for [place].  Her prognosis clearly is based upon her cardiopulmonary status.  Again she does need oxygen at home.  I think she would benefit from oxygen.  Her oxygen level came up quite nicely when we put on 2 L.  We will plan to see what her iron studies show.  I would not think she would need iron but this might be a possibility.  I will plan to have her come back in another couple months.  Volanda Napoleon, MD 7/7/20221:56 PM

## 2021-03-13 NOTE — Telephone Encounter (Signed)
ATC daughter, no voicemail will attempt to contact at later time.

## 2021-03-14 ENCOUNTER — Inpatient Hospital Stay (HOSPITAL_COMMUNITY)
Admission: EM | Admit: 2021-03-14 | Discharge: 2021-03-18 | DRG: 291 | Disposition: A | Discharge status: Home / Self Care | Payer: Medicare Other | Attending: Internal Medicine | Admitting: Internal Medicine

## 2021-03-14 ENCOUNTER — Emergency Department (HOSPITAL_COMMUNITY): Payer: Medicare Other

## 2021-03-14 ENCOUNTER — Telehealth: Payer: Self-pay | Admitting: *Deleted

## 2021-03-14 ENCOUNTER — Encounter (HOSPITAL_COMMUNITY): Payer: Self-pay

## 2021-03-14 ENCOUNTER — Other Ambulatory Visit: Payer: Self-pay

## 2021-03-14 ENCOUNTER — Ambulatory Visit (HOSPITAL_BASED_OUTPATIENT_CLINIC_OR_DEPARTMENT_OTHER)
Admission: RE | Admit: 2021-03-14 | Discharge: 2021-03-14 | Disposition: A | Discharge status: Home / Self Care | Payer: Medicare Other | Source: Ambulatory Visit | Attending: Nurse Practitioner | Admitting: Nurse Practitioner

## 2021-03-14 ENCOUNTER — Encounter: Payer: Self-pay | Admitting: Family

## 2021-03-14 ENCOUNTER — Encounter (HOSPITAL_COMMUNITY): Payer: Self-pay | Admitting: Nurse Practitioner

## 2021-03-14 ENCOUNTER — Telehealth: Payer: Self-pay | Admitting: Pulmonary Disease

## 2021-03-14 VITALS — BP 142/60 | HR 64 | Ht 61.0 in | Wt 224.6 lb

## 2021-03-14 DIAGNOSIS — Z79890 Hormone replacement therapy (postmenopausal): Secondary | ICD-10-CM | POA: Insufficient documentation

## 2021-03-14 DIAGNOSIS — E7800 Pure hypercholesterolemia, unspecified: Secondary | ICD-10-CM | POA: Diagnosis not present

## 2021-03-14 DIAGNOSIS — Z79899 Other long term (current) drug therapy: Secondary | ICD-10-CM

## 2021-03-14 DIAGNOSIS — R0602 Shortness of breath: Secondary | ICD-10-CM

## 2021-03-14 DIAGNOSIS — E039 Hypothyroidism, unspecified: Secondary | ICD-10-CM | POA: Diagnosis not present

## 2021-03-14 DIAGNOSIS — J9600 Acute respiratory failure, unspecified whether with hypoxia or hypercapnia: Secondary | ICD-10-CM | POA: Diagnosis not present

## 2021-03-14 DIAGNOSIS — I5033 Acute on chronic diastolic (congestive) heart failure: Secondary | ICD-10-CM | POA: Diagnosis present

## 2021-03-14 DIAGNOSIS — I10 Essential (primary) hypertension: Secondary | ICD-10-CM | POA: Diagnosis present

## 2021-03-14 DIAGNOSIS — J90 Pleural effusion, not elsewhere classified: Secondary | ICD-10-CM | POA: Diagnosis not present

## 2021-03-14 DIAGNOSIS — Z87891 Personal history of nicotine dependence: Secondary | ICD-10-CM

## 2021-03-14 DIAGNOSIS — E8770 Fluid overload, unspecified: Secondary | ICD-10-CM | POA: Diagnosis not present

## 2021-03-14 DIAGNOSIS — Z888 Allergy status to other drugs, medicaments and biological substances status: Secondary | ICD-10-CM | POA: Insufficient documentation

## 2021-03-14 DIAGNOSIS — Z91048 Other nonmedicinal substance allergy status: Secondary | ICD-10-CM

## 2021-03-14 DIAGNOSIS — Z91041 Radiographic dye allergy status: Secondary | ICD-10-CM

## 2021-03-14 DIAGNOSIS — I509 Heart failure, unspecified: Secondary | ICD-10-CM | POA: Insufficient documentation

## 2021-03-14 DIAGNOSIS — I517 Cardiomegaly: Secondary | ICD-10-CM | POA: Diagnosis not present

## 2021-03-14 DIAGNOSIS — Z961 Presence of intraocular lens: Secondary | ICD-10-CM | POA: Insufficient documentation

## 2021-03-14 DIAGNOSIS — I130 Hypertensive heart and chronic kidney disease with heart failure and stage 1 through stage 4 chronic kidney disease, or unspecified chronic kidney disease: Principal | ICD-10-CM | POA: Diagnosis present

## 2021-03-14 DIAGNOSIS — N390 Urinary tract infection, site not specified: Secondary | ICD-10-CM | POA: Diagnosis not present

## 2021-03-14 DIAGNOSIS — K219 Gastro-esophageal reflux disease without esophagitis: Secondary | ICD-10-CM | POA: Diagnosis present

## 2021-03-14 DIAGNOSIS — Z794 Long term (current) use of insulin: Secondary | ICD-10-CM | POA: Insufficient documentation

## 2021-03-14 DIAGNOSIS — J811 Chronic pulmonary edema: Secondary | ICD-10-CM | POA: Diagnosis not present

## 2021-03-14 DIAGNOSIS — Z83438 Family history of other disorder of lipoprotein metabolism and other lipidemia: Secondary | ICD-10-CM

## 2021-03-14 DIAGNOSIS — N184 Chronic kidney disease, stage 4 (severe): Secondary | ICD-10-CM | POA: Diagnosis not present

## 2021-03-14 DIAGNOSIS — Z882 Allergy status to sulfonamides status: Secondary | ICD-10-CM

## 2021-03-14 DIAGNOSIS — I2510 Atherosclerotic heart disease of native coronary artery without angina pectoris: Secondary | ICD-10-CM | POA: Diagnosis not present

## 2021-03-14 DIAGNOSIS — J9601 Acute respiratory failure with hypoxia: Secondary | ICD-10-CM | POA: Diagnosis not present

## 2021-03-14 DIAGNOSIS — Z6841 Body Mass Index (BMI) 40.0 and over, adult: Secondary | ICD-10-CM | POA: Diagnosis not present

## 2021-03-14 DIAGNOSIS — Z886 Allergy status to analgesic agent status: Secondary | ICD-10-CM

## 2021-03-14 DIAGNOSIS — E785 Hyperlipidemia, unspecified: Secondary | ICD-10-CM | POA: Diagnosis present

## 2021-03-14 DIAGNOSIS — F32A Depression, unspecified: Secondary | ICD-10-CM | POA: Diagnosis present

## 2021-03-14 DIAGNOSIS — E890 Postprocedural hypothyroidism: Secondary | ICD-10-CM | POA: Diagnosis not present

## 2021-03-14 DIAGNOSIS — Z8249 Family history of ischemic heart disease and other diseases of the circulatory system: Secondary | ICD-10-CM

## 2021-03-14 DIAGNOSIS — Z91040 Latex allergy status: Secondary | ICD-10-CM | POA: Insufficient documentation

## 2021-03-14 DIAGNOSIS — Z818 Family history of other mental and behavioral disorders: Secondary | ICD-10-CM

## 2021-03-14 DIAGNOSIS — G4733 Obstructive sleep apnea (adult) (pediatric): Secondary | ICD-10-CM | POA: Diagnosis present

## 2021-03-14 DIAGNOSIS — D6869 Other thrombophilia: Secondary | ICD-10-CM

## 2021-03-14 DIAGNOSIS — I4821 Permanent atrial fibrillation: Secondary | ICD-10-CM | POA: Insufficient documentation

## 2021-03-14 DIAGNOSIS — Z86711 Personal history of pulmonary embolism: Secondary | ICD-10-CM

## 2021-03-14 DIAGNOSIS — D696 Thrombocytopenia, unspecified: Secondary | ICD-10-CM | POA: Diagnosis present

## 2021-03-14 DIAGNOSIS — Z9842 Cataract extraction status, left eye: Secondary | ICD-10-CM

## 2021-03-14 DIAGNOSIS — J9621 Acute and chronic respiratory failure with hypoxia: Secondary | ICD-10-CM | POA: Diagnosis present

## 2021-03-14 DIAGNOSIS — F411 Generalized anxiety disorder: Secondary | ICD-10-CM | POA: Diagnosis present

## 2021-03-14 DIAGNOSIS — I110 Hypertensive heart disease with heart failure: Secondary | ICD-10-CM | POA: Diagnosis not present

## 2021-03-14 DIAGNOSIS — I484 Atypical atrial flutter: Secondary | ICD-10-CM | POA: Diagnosis present

## 2021-03-14 DIAGNOSIS — Z66 Do not resuscitate: Secondary | ICD-10-CM | POA: Diagnosis present

## 2021-03-14 DIAGNOSIS — J9690 Respiratory failure, unspecified, unspecified whether with hypoxia or hypercapnia: Secondary | ICD-10-CM | POA: Diagnosis not present

## 2021-03-14 DIAGNOSIS — J309 Allergic rhinitis, unspecified: Secondary | ICD-10-CM | POA: Diagnosis present

## 2021-03-14 DIAGNOSIS — M6281 Muscle weakness (generalized): Secondary | ICD-10-CM | POA: Diagnosis not present

## 2021-03-14 DIAGNOSIS — Z20822 Contact with and (suspected) exposure to covid-19: Secondary | ICD-10-CM | POA: Diagnosis not present

## 2021-03-14 DIAGNOSIS — M419 Scoliosis, unspecified: Secondary | ICD-10-CM | POA: Diagnosis not present

## 2021-03-14 DIAGNOSIS — J40 Bronchitis, not specified as acute or chronic: Secondary | ICD-10-CM | POA: Diagnosis not present

## 2021-03-14 DIAGNOSIS — E739 Lactose intolerance, unspecified: Secondary | ICD-10-CM | POA: Diagnosis present

## 2021-03-14 DIAGNOSIS — R0600 Dyspnea, unspecified: Secondary | ICD-10-CM | POA: Diagnosis not present

## 2021-03-14 DIAGNOSIS — E119 Type 2 diabetes mellitus without complications: Secondary | ICD-10-CM

## 2021-03-14 DIAGNOSIS — Z96652 Presence of left artificial knee joint: Secondary | ICD-10-CM | POA: Diagnosis present

## 2021-03-14 DIAGNOSIS — Z91018 Allergy to other foods: Secondary | ICD-10-CM | POA: Insufficient documentation

## 2021-03-14 DIAGNOSIS — Z833 Family history of diabetes mellitus: Secondary | ICD-10-CM

## 2021-03-14 DIAGNOSIS — E1122 Type 2 diabetes mellitus with diabetic chronic kidney disease: Secondary | ICD-10-CM | POA: Diagnosis present

## 2021-03-14 DIAGNOSIS — Z9109 Other allergy status, other than to drugs and biological substances: Secondary | ICD-10-CM | POA: Insufficient documentation

## 2021-03-14 DIAGNOSIS — Z9841 Cataract extraction status, right eye: Secondary | ICD-10-CM

## 2021-03-14 LAB — IRON AND TIBC
Iron: 72 ug/dL (ref 41–142)
Saturation Ratios: 23 % (ref 21–57)
TIBC: 316 ug/dL (ref 236–444)
UIBC: 244 ug/dL (ref 120–384)

## 2021-03-14 LAB — TROPONIN I (HIGH SENSITIVITY)
Troponin I (High Sensitivity): 10 ng/L (ref <18)
Troponin I (High Sensitivity): 11 ng/L (ref <18)

## 2021-03-14 LAB — CBC WITH DIFFERENTIAL/PLATELET
Abs Immature Granulocytes: 0.04 10*3/uL (ref 0.00–0.07)
Basophils Absolute: 0 10*3/uL (ref 0.0–0.1)
Basophils Relative: 0 %
Eosinophils Absolute: 0.2 10*3/uL (ref 0.0–0.5)
Eosinophils Relative: 3 %
HCT: 33.5 % — ABNORMAL LOW (ref 36.0–46.0)
Hemoglobin: 10.9 g/dL — ABNORMAL LOW (ref 12.0–15.0)
Immature Granulocytes: 1 %
Lymphocytes Relative: 16 %
Lymphs Abs: 0.9 10*3/uL (ref 0.7–4.0)
MCH: 31 pg (ref 26.0–34.0)
MCHC: 32.5 g/dL (ref 30.0–36.0)
MCV: 95.2 fL (ref 80.0–100.0)
Monocytes Absolute: 0.5 10*3/uL (ref 0.1–1.0)
Monocytes Relative: 9 %
Neutro Abs: 3.7 10*3/uL (ref 1.7–7.7)
Neutrophils Relative %: 71 %
Platelets: 136 10*3/uL — ABNORMAL LOW (ref 150–400)
RBC: 3.52 MIL/uL — ABNORMAL LOW (ref 3.87–5.11)
RDW: 17.2 % — ABNORMAL HIGH (ref 11.5–15.5)
WBC: 5.3 10*3/uL (ref 4.0–10.5)
nRBC: 0 % (ref 0.0–0.2)

## 2021-03-14 LAB — RESP PANEL BY RT-PCR (FLU A&B, COVID) ARPGX2
Influenza A by PCR: NEGATIVE
Influenza B by PCR: NEGATIVE
SARS Coronavirus 2 by RT PCR: NEGATIVE

## 2021-03-14 LAB — BASIC METABOLIC PANEL
Anion gap: 10 (ref 5–15)
BUN: 66 mg/dL — ABNORMAL HIGH (ref 8–23)
CO2: 28 mmol/L (ref 22–32)
Calcium: 9.1 mg/dL (ref 8.9–10.3)
Chloride: 104 mmol/L (ref 98–111)
Creatinine, Ser: 2.27 mg/dL — ABNORMAL HIGH (ref 0.44–1.00)
GFR, Estimated: 21 mL/min — ABNORMAL LOW (ref >60)
Glucose, Bld: 106 mg/dL — ABNORMAL HIGH (ref 70–99)
Potassium: 4.2 mmol/L (ref 3.5–5.1)
Sodium: 142 mmol/L (ref 135–145)

## 2021-03-14 LAB — CBG MONITORING, ED: Glucose-Capillary: 155 mg/dL — ABNORMAL HIGH (ref 70–99)

## 2021-03-14 LAB — FERRITIN: Ferritin: 290 ng/mL (ref 11–307)

## 2021-03-14 LAB — BRAIN NATRIURETIC PEPTIDE: B Natriuretic Peptide: 111.6 pg/mL — ABNORMAL HIGH (ref 0.0–100.0)

## 2021-03-14 MED ORDER — ACETAMINOPHEN 650 MG RE SUPP: 650.0000 mg | Freq: Four times a day (QID) | RECTAL | Status: DC | PRN

## 2021-03-14 MED ORDER — FUROSEMIDE 10 MG/ML IJ SOLN
40.0000 mg | Freq: Once | INTRAMUSCULAR | Status: AC
  Administered 2021-03-14: 40 mg via INTRAVENOUS
  Filled 2021-03-14: qty 4

## 2021-03-14 MED ORDER — POTASSIUM CITRATE ER 10 MEQ (1080 MG) PO TBCR
20.0000 meq | EXTENDED_RELEASE_TABLET | Freq: Two times a day (BID) | ORAL | Status: DC
  Administered 2021-03-15 – 2021-03-18 (×8): 20 meq via ORAL
  Filled 2021-03-14 (×10): qty 2

## 2021-03-14 MED ORDER — SERTRALINE HCL 50 MG PO TABS
50.0000 mg | ORAL_TABLET | Freq: Every day | ORAL | Status: DC
  Administered 2021-03-14 – 2021-03-17 (×4): 50 mg via ORAL
  Filled 2021-03-14 (×4): qty 1

## 2021-03-14 MED ORDER — FUROSEMIDE 10 MG/ML IJ SOLN
80.0000 mg | Freq: Two times a day (BID) | INTRAMUSCULAR | Status: DC
  Administered 2021-03-15 – 2021-03-17 (×6): 80 mg via INTRAVENOUS
  Filled 2021-03-14 (×6): qty 8

## 2021-03-14 MED ORDER — ACETAMINOPHEN 325 MG PO TABS
650.0000 mg | ORAL_TABLET | Freq: Four times a day (QID) | ORAL | Status: DC | PRN
  Administered 2021-03-17: 650 mg via ORAL
  Filled 2021-03-14 (×2): qty 2

## 2021-03-14 MED ORDER — VITAMIN D3 25 MCG (1000 UNIT) PO TABS
2000.0000 [IU] | ORAL_TABLET | Freq: Every day | ORAL | Status: DC
  Administered 2021-03-16 – 2021-03-18 (×3): 2000 [IU] via ORAL
  Filled 2021-03-14 (×4): qty 2

## 2021-03-14 MED ORDER — INSULIN ASPART 100 UNIT/ML IJ SOLN
0.0000 [IU] | Freq: Three times a day (TID) | INTRAMUSCULAR | Status: DC
  Administered 2021-03-15: 2 [IU] via SUBCUTANEOUS
  Administered 2021-03-15 – 2021-03-16 (×4): 3 [IU] via SUBCUTANEOUS
  Administered 2021-03-17: 2 [IU] via SUBCUTANEOUS
  Administered 2021-03-17 (×2): 3 [IU] via SUBCUTANEOUS
  Administered 2021-03-18: 2 [IU] via SUBCUTANEOUS
  Administered 2021-03-18: 3 [IU] via SUBCUTANEOUS
  Filled 2021-03-14: qty 0.15

## 2021-03-14 MED ORDER — METOPROLOL SUCCINATE ER 25 MG PO TB24
25.0000 mg | ORAL_TABLET | Freq: Every day | ORAL | Status: DC
  Administered 2021-03-15 – 2021-03-18 (×4): 25 mg via ORAL
  Filled 2021-03-14 (×4): qty 1

## 2021-03-14 MED ORDER — LEVOTHYROXINE SODIUM 112 MCG PO TABS
112.0000 ug | ORAL_TABLET | Freq: Every day | ORAL | Status: DC
  Administered 2021-03-15 – 2021-03-18 (×4): 112 ug via ORAL
  Filled 2021-03-14 (×4): qty 1

## 2021-03-14 MED ORDER — FUROSEMIDE 10 MG/ML IJ SOLN: 80.0000 mg | Freq: Two times a day (BID) | INTRAMUSCULAR | Status: DC

## 2021-03-14 MED ORDER — INSULIN GLARGINE 100 UNIT/ML ~~LOC~~ SOLN
46.0000 [IU] | Freq: Every day | SUBCUTANEOUS | Status: DC
  Administered 2021-03-15 – 2021-03-17 (×4): 46 [IU] via SUBCUTANEOUS
  Filled 2021-03-14 (×4): qty 0.46

## 2021-03-14 MED ORDER — INSULIN ASPART 100 UNIT/ML IJ SOLN
0.0000 [IU] | Freq: Every day | INTRAMUSCULAR | Status: DC
  Administered 2021-03-17: 3 [IU] via SUBCUTANEOUS
  Filled 2021-03-14: qty 0.05

## 2021-03-14 MED ORDER — AMLODIPINE BESYLATE 5 MG PO TABS
7.5000 mg | ORAL_TABLET | Freq: Every day | ORAL | Status: DC
  Administered 2021-03-15 – 2021-03-18 (×4): 7.5 mg via ORAL
  Filled 2021-03-14 (×4): qty 2

## 2021-03-14 MED ORDER — VITAMIN B-12 100 MCG PO TABS
100.0000 ug | ORAL_TABLET | Freq: Every day | ORAL | Status: DC
  Administered 2021-03-16 – 2021-03-18 (×3): 100 ug via ORAL
  Filled 2021-03-14 (×4): qty 1

## 2021-03-14 MED ORDER — ALBUTEROL SULFATE (2.5 MG/3ML) 0.083% IN NEBU: 2.5000 mg | INHALATION_SOLUTION | Freq: Four times a day (QID) | RESPIRATORY_TRACT | Status: DC | PRN

## 2021-03-14 MED ORDER — ONDANSETRON HCL 4 MG PO TABS: 4.0000 mg | ORAL_TABLET | Freq: Four times a day (QID) | ORAL | Status: DC | PRN

## 2021-03-14 MED ORDER — ATORVASTATIN CALCIUM 40 MG PO TABS
40.0000 mg | ORAL_TABLET | Freq: Every day | ORAL | Status: DC
  Administered 2021-03-14 – 2021-03-17 (×4): 40 mg via ORAL
  Filled 2021-03-14 (×4): qty 1

## 2021-03-14 MED ORDER — ONDANSETRON HCL 4 MG/2ML IJ SOLN: 4.0000 mg | Freq: Four times a day (QID) | INTRAMUSCULAR | Status: DC | PRN

## 2021-03-14 NOTE — ED Triage Notes (Signed)
Pt reports wen to Afib clinic this morning and was sent here to have fluid drawn off her lung. Pt reports 79% RA at clinic this morning.

## 2021-03-14 NOTE — ED Notes (Signed)
RN [place] placed pt on 2L [place].

## 2021-03-14 NOTE — ED Provider Notes (Addendum)
Emergency Medicine Provider Triage Evaluation Note  [name] , a 86 y.o. female  was evaluated in triage.  Pt complains of hypoxia. She reports hx pleural effusion and has had sob for some time. She was at an appointment with her cardiology office pta and was noted to have an o2 sat of 89% on RA.   Review of Systems  Positive: Sob, ble edema Negative: Chest pain  Physical Exam  BP (!) 151/67 (BP Location: Left Arm)   Pulse 73   Temp 98.4 F (36.9 C) (Oral)   Resp 16   LMP  (LMP Unknown)   SpO2 93%  Gen:   Awake, no distress   Resp:  Normal effort  MSK:   Moves extremities without difficulty   Medical Decision Making  Medically screening exam initiated at 2:56 PM.  Appropriate orders placed.  [name] was informed that the remainder of the evaluation will be completed by another provider, this initial triage assessment does not replace that evaluation, and the importance of remaining in the ED until their evaluation is complete.    Sara Booze, PA-C 03/14/21 1459    Sara Booze, PA-C 03/14/21 1459    Sara Ferguson, MD 03/16/21 [phone]

## 2021-03-14 NOTE — Telephone Encounter (Signed)
Spoke with pt and scheduled her to see Dr. Elsworth Soho in office on July 12th, 2022 at 4pm per Dr. Bari Mantis request to see if pt qualifies for O2. Pt stated understanding. Nothing further needed at this time.

## 2021-03-14 NOTE — Telephone Encounter (Signed)
Pt saw oncology yesterday and her oxygen was reading 70%'s-84%. Pt saw A Fib clinic today, they sent her to the ER. Pt was found to have pleural effusions,and possibly getting fluid drained off lungs. Pt is scheduled with RA for 7/12. Please advise [phone]

## 2021-03-14 NOTE — Telephone Encounter (Signed)
Per Dr. Elsworth Soho  Try to get her in to see me on July 12 or 13 @ 4pm  With nay other provider if opening sooner  Or she can see PCP to check & order O2 if she qualifies   Patient scheduled.  Nothing further needed at this time.

## 2021-03-14 NOTE — Progress Notes (Addendum)
Primary Care Physician: Caren Macadam, MD Referring Physician: Dr. Rayann Heman Cardiologist: Dr. Marlou Porch Hematology: Dr. Cyndi Lennert [name] is a 86 y.o. female with a h/o prior paroxysmal afib as well as prior PTE. She was in [place] PTE study with Dr Joya Gaskins years ago. She reports having palpitations (likely afib) for "years". She was placed on eliquis. She had  issues with bleeding and thrombocytopenia and has been followed by Dr Marin Olp. She has not taken anticoagulation in several years because of this.  She presented to Dr Marlou Porch 05/24/18 and was noted to be in SVT (either Aflutter vs atach). She was mostly unaware. She has noticed slight worsening of fatigue, but is not very active at baseline.  She has complicating issues of CRF (baseline creatinine 2-2.3), thrombocytopenia (platelet range 28-121k in past), anemia (baseline hb 10 ), and obesity (250 lbs).   Dr. Marlou Porch felt that rate control was her goal and increased her diltiazem to 180 mg qd. She was seen f/u in the afib clinic and was  nicely rate controlled  in the 80's. She felt improved.  Pt last  seen in the afib clinic 06/06/19. Dr. Jonette Eva has been able to get pt 's platelets at a good stable  level and she is now back on eliquis 2.5 mg bid x 6 weeks after being off for over a year.Marland Kitchen He discussed with her and Dr. Marlou Porch  trying a cardioversion.  She had a successful CV  in fall of 2020 but unfortunately went back out of rhythm a short period later. At a later date, anticoagulation was stopped.   I am seeing her today, 03/14/21,  for a f/u of [place] visit, 6/29,  at which time pt felt she was having more palpitations. Ms.Lenze discussed with Dr. Rayann Heman and he recommended adding Toprol XL 25 mg daily. She is well rate controlled today and has not felt the palpitations since.  However, she has had increasing shortness of breath over the last 2 weeks with pulse ox at home in the 70's. On arrival here she was upper  70's and low 80's on room air but after sitting quiet she was at 92%. She saw Dr. Marin Olp yesterday with blood work and send a RX to have her receive home O2. The daughter states that her dry weight should be around 220 lbs. She is 224 lbs today with the last 5 days of taking increased lasix of 40 mg daily. Her cmet yesterday shows her creatinine has increased to 2.2 with a BUN of 61. I reviewed a CT of the neck from 6/30 ordered by PCP that showed moderately large rt pleural effusion on the rt and small effusion on the left. She was hospitalized at [facility] hospital end of May for acute on chronic HF with hypertensive emergency.   Today, she denies symptoms of palpitations, chest pain, shortness of breath, orthopnea, PND, lower extremity edema, dizziness, presyncope, syncope, or neurologic sequela. The patient is tolerating medications without difficulties and is otherwise without complaint today.   Past Medical History:  Diagnosis Date   Allergic rhinitis    Anxiety state, unspecified    panic attacks   CHF (congestive heart failure) (HCC)    Depressive disorder, not elsewhere classified    Extrinsic asthma, unspecified    no problem since adulthood   History of complications due to general anesthesia 03/03/2021   Obesity    OSA on CPAP    severe   Pneumonitis 07/14/2019  Pressure injury of skin 07/10/2019   Pulmonary embolism ([place])    Pure hypercholesterolemia    Respiratory failure with hypoxia ([place]) 09/2008   acute, secondary to multiple bilateral pulmonary embolism , negative hypercoagulable workup 09/2008 hospital stay   Scoliosis    Type II or unspecified type diabetes mellitus without mention of complication, not stated as uncontrolled    Unspecified hypothyroidism    hypo   Past Surgical History:  Procedure Laterality Date   CARDIOVERSION N/A 06/26/2019   Procedure: CARDIOVERSION;  Surgeon: Jerline Pain, MD;  Location: [facility];  Service: Cardiovascular;  Laterality: N/A;   EYE  SURGERY Bilateral 2005   cataracts, implants   KIDNEY STONE SURGERY Bilateral    2016, 2017    knee replaced Left 2013   THYROID SURGERY  1966   nodule removal; partial thyroidectomy; radioactive iodine x2; 1990's    Current Outpatient Medications  Medication Sig Dispense Refill   acetaminophen (TYLENOL) 325 MG tablet Take 650 mg by mouth in the morning and at bedtime.     amLODipine (NORVASC) 2.5 MG tablet Take 3 tablets (7.5 mg total) by mouth daily. 270 tablet 1   amoxicillin (AMOXIL) 500 MG tablet 500 mg as needed.     atorvastatin (LIPITOR) 40 MG tablet Take 1 tablet (40 mg total) by mouth at bedtime. 90 tablet 0   BD PEN NEEDLE NANO 2ND GEN 32G X 4 MM MISC USE TO INJECT UP TO FOUR TIMES DAILY AS DIRECTED 200 each 3   bisacodyl (DULCOLAX) 5 MG EC tablet Take 10 mg by mouth every 3 (three) days as needed (for constipation).     Bismuth Tribromoph-Petrolatum (XEROFORM PETROLAT PATCH 4"X4") PADS Apply 1 Units topically daily as needed. 75 each 5   Cholecalciferol (VITAMIN D3) 2000 units TABS Take 2,000 Units by mouth daily.     Continuous Blood Gluc Sensor (FREESTYLE LIBRE 14 DAY SENSOR) MISC APPLY NEW SENSOR EVERY 14 DAYS AS DIRECTED 6 each 3   fexofenadine (ALLEGRA) 180 MG tablet Take 180 mg by mouth daily.     furosemide (LASIX) 20 MG tablet TAKE 1 TABLET BY MOUTH 4 TIMES EVERY WEEK, ALTERNATING WITH 40 MG THREE TIMES EVERY WEEK 90 tablet 1   furosemide (LASIX) 40 MG tablet TAKE 1 TABLET BY MOUTH THREE TIMES WEEKLY, ALTERNATING WITH 20MG  FOUR TIMES WEEKLY 90 tablet 1   HUMALOG KWIKPEN 100 UNIT/ML KwikPen INJECT 13 TO 20 UNITS UNDER THE SKIN THREE TIMES DAILY WITH BREAKFAST, LUNCH AND EVENING MEAL 30 mL 1   hyoscyamine (LEVSIN) 0.125 MG tablet Take 1 tablet by mouth daily.     Insulin Syringe-Needle U-100 (INSULIN SYRINGE .3CC/29GX1/2") 29G X 1/2" 0.3 ML MISC Use to inject insulin 4 times a day. 400 each 3   LANTUS SOLOSTAR 100 UNIT/ML Solostar Pen ADMINISTER 50 UNITS UNDER THE SKIN  DAILY 30 mL 3   levothyroxine (SYNTHROID) 112 MCG tablet Take 1 tablet (112 mcg total) by mouth daily. 90 tablet 1   metoprolol succinate (TOPROL XL) 25 MG 24 hr tablet Take 1 tablet (25 mg total) by mouth daily. 90 tablet 3   Multiple Vitamin (MULTIVITAMIN) tablet Take 1 tablet by mouth daily.     potassium citrate (UROCIT-K) 10 MEQ (1080 MG) SR tablet Take 20 mEq by mouth in the morning and at bedtime.     sertraline (ZOLOFT) 50 MG tablet TAKE 1 TABLET BY MOUTH AT BEDTIME 90 tablet 1   vitamin B-12 (CYANOCOBALAMIN) 100 MCG tablet Take 100 mcg by  mouth daily.     vitamin C (ASCORBIC ACID) 250 MG tablet Take 250 mg by mouth daily.     No current facility-administered medications for this encounter.    Allergies  Allergen Reactions   Adhesive [Tape] Other (See Comments)    PULLS OFF THE SKIN   Apixaban Other (See Comments)    Internal Bleeding   Aspirin Itching, Rash, Hives and Swelling    Swelling of her tongue   Mirabegron Other (See Comments)    Patient experienced A-Fib   Pineapple Anaphylaxis and Swelling    Throat swells and blisters on tongue and roof of mouth per patient   Metformin Diarrhea and Nausea Only   Tetracycline Hives   Fluticasone-Salmeterol Itching and Rash   Iodinated Diagnostic Agents Itching and Rash        Lactose Intolerance (Gi) Other (See Comments)    Gas    Latex Itching, Rash and Other (See Comments)    Pulls off the skin and causes welts   Lisinopril Cough   Metronidazole Other (See Comments)    Reaction not known   Sulfa Antibiotics Rash   Sulfonamide Derivatives Itching and Rash    Social History   Socioeconomic History   Marital status: Widowed    Spouse name: Not on file   Number of children: 2   Years of education: Not on file   Highest education level: Not on file  Occupational History   Occupation: OWNER    Employer: [employer]    Comment: Self employed- runs Advertising copywriter  Tobacco Use   Smoking status: Former     Packs/day: 0.25    Years: 20.00    Pack years: 5.00    Types: Cigarettes    Quit date: 09/07/1989    Years since quitting: 31.5   Smokeless tobacco: Never  Vaping Use   Vaping Use: Never used  Substance and Sexual Activity   Alcohol use: No   Drug use: No   Sexual activity: Not Currently  Other Topics Concern   Not on file  Social History Narrative   Lives at [facility] Determinants of Health   Financial Resource Strain: Not on file  Food Insecurity: Not on file  Transportation Needs: Not on file  Physical Activity: Not on file  Stress: Not on file  Social Connections: Not on file  Intimate Partner Violence: Not on file    Family History  Problem Relation Age of Onset   Hypertension Father    Hyperlipidemia Father    Allergies Mother    Clotting disorder Mother    Osteoarthritis Mother    Asthma Mother    Hypertension Mother    Stroke Mother    Coronary artery disease Other    Stroke Other    Rheum arthritis Maternal Grandmother    Clotting disorder Maternal Grandmother    Clotting disorder Maternal Uncle    Arthritis Daughter    Diabetes Daughter    High blood pressure Daughter    High Cholesterol Daughter    Depression Maternal Grandfather    Diabetes Paternal Grandmother    Breast cancer Neg Hx     ROS- All systems are reviewed and negative except as per the HPI above  Physical Exam: Vitals:   03/14/21 1148  BP: (!) 142/60  Pulse: 64  SpO2: (!) 89%  Weight: 101.9 kg  Height: 5\' 1"  (1.549 m)   Wt Readings from Last 3 Encounters:  03/14/21 101.9 kg  03/13/21 101.6  kg  03/06/21 101.3 kg    Labs: Lab Results  Component Value Date   NA 137 03/13/2021   K 4.3 03/13/2021   CL 99 03/13/2021   CO2 30 03/13/2021   GLUCOSE 197 (H) 03/13/2021   BUN 61 (H) 03/13/2021   CREATININE 2.20 (H) 03/13/2021   CALCIUM 10.1 03/13/2021   PHOS 4.6 07/26/2019   MG 2.6 (H) 08/04/2019   Lab Results  Component Value Date   INR 1.4 (H) 07/03/2019    Lab Results  Component Value Date   CHOL 141 08/14/2020   HDL 27 (L) 08/14/2020   LDLCALC 74 08/14/2020   TRIG 334 (H) 08/14/2020     GEN- The patient is well appearing, alert and oriented x 3 today.   Head- normocephalic, atraumatic Eyes-  Sclera clear, conjunctiva pink Ears- hearing intact Oropharynx- clear Neck- supple, no JVP Lymph- no cervical lymphadenopathy Lungs- Clear to ausculation bilaterally, normal work of breathing, rales present both lungs  Heart- irregular rate and rhythm, no murmurs, rubs or gallops, PMI not laterally displaced GI- soft, NT, ND, + BS Extremities- no clubbing, cyanosis, or edema MS- no significant deformity or atrophy Skin- no rash or lesion Psych- euthymic mood, full affect Neuro- strength and sensation are intact  EKG-afib at 64 bpm    CT of neck 6/30-IMPRESSION: 3 mm calculi in the left submandibular duct causing obstruction and inflammation. There is enlargement of the left submandibular gland with surrounding edema.   Tiny nonobstructing calculi in the right submandibular gland   No adenopathy in the neck.   Bilateral pleural effusions right greater than left.  CXR 5/26- Cardiomegaly again noted.   2. Diffuse bilateral interstitial prominence consistent with interstitial edema and or pneumonitis. Small bilateral pleural effusions.    Assessment and Plan: 1.Permanent afib   Rate controlled  Continue Toprol xl 25 mg daily   2. Her CHA2DS2VASc score is at least  5  Not on anticoagulation 2/2 thrombocytopenia and h/o bleeding of unknown source   3. Htn Stable  4. Acute on chronic HF Her weight is up 4 lbs from her dry weight of 220 lbs despite increasing lasix to 40 mg daily x 5 days Her creatinine yesterday was 2.2 with a BUN of 61 Her O2 levels at home have been as low as in the 70's/80's with exertion Soft tissue of the neck CT 6/30 shows moderately large rt pleural effusion with small left pleural effusion which  likely explains her increasing shortness of breath and low sats I do not see a good way to treat this as an outpatient  I explained to pt I feel she needs to go to the ER for further evaluation and possible admission  I offered to roll her down the hall to [facility] ER She had a bad experience with an admission here and does not want to go here She prefers to go to [facility] I called and spoke to the Charge nurse there She is currently stable at rest here, but needs more evaluation for worsening shortness of breath, low exertional O2  sats and pleural effusions which extra lasix at home x 5 days does not seem to be improving symptoms,  with worsening renal status     To  [facility]  ER per daughter's private car  Geroge Baseman. Rupert Azzara, [facility] [address] [place], [place] [address] [phone]

## 2021-03-14 NOTE — Telephone Encounter (Signed)
Per 03/14/21 los called and lvm of upcoming appointment - requested callback to confirm

## 2021-03-14 NOTE — ED Notes (Signed)
Per heart failure clinic, states patient has decreased SATS-elevated BUN

## 2021-03-14 NOTE — ED Provider Notes (Signed)
[facility] DEPT Provider Note   CSN: [id number] Arrival date & time: 03/14/21  1355     History Chief Complaint  Patient presents with   Shortness of Breath    [name] is a 86 y.o. female.  The history is provided by the patient.  Shortness of Breath Severity:  Moderate Onset quality:  Gradual Duration:  4 days Timing:  Constant Progression:  Worsening Chronicity:  Recurrent Context comment:  Sent here by cardiology clinic for concern for volume overload. Relieved by:  Nothing Worsened by:  Nothing Associated symptoms: no abdominal pain, no chest pain, no cough, no ear pain, no fever, no rash, no sore throat and no vomiting   Risk factors: hx of PE/DVT   Risk factors comment:  Atrial flutter     Past Medical History:  Diagnosis Date   Allergic rhinitis    Anxiety state, unspecified    panic attacks   CHF (congestive heart failure) (HCC)    Depressive disorder, not elsewhere classified    Extrinsic asthma, unspecified    no problem since adulthood   History of complications due to general anesthesia 03/03/2021   Obesity    OSA on CPAP    severe   Pneumonitis 07/14/2019   Pressure injury of skin 07/10/2019   Pulmonary embolism ([place])    Pure hypercholesterolemia    Respiratory failure with hypoxia ([place]) 09/2008   acute, secondary to multiple bilateral pulmonary embolism , negative hypercoagulable workup 09/2008 hospital stay   Scoliosis    Type II or unspecified type diabetes mellitus without mention of complication, not stated as uncontrolled    Unspecified hypothyroidism    hypo    Patient Active Problem List   Diagnosis Date Noted   Acute on chronic diastolic (congestive) heart failure ([place]) 03/14/2021   History of anxiety 03/03/2021   History of blood clotting disorder 03/03/2021   History of complications due to general anesthesia 03/03/2021   Hypertensive urgency 01/30/2021   Acute on chronic diastolic CHF  (congestive heart failure) ([place]) 01/30/2021   Lateral epicondylitis of right elbow 08/28/2020   Pain in joint of right elbow 07/15/2020   Calculus of gallbladder without cholecystitis without obstruction 09/28/2019   Infection and inflammatory reaction due to indwelling urethral catheter, subsequent encounter 09/28/2019   Nondisplaced fracture of lateral malleolus of right fibula, subsequent encounter for closed fracture with routine healing 09/28/2019   Disorientation, unspecified 08/07/2019   Fever, unspecified 08/07/2019   Hemorrhage of anus and rectum 08/07/2019   History of pulmonary embolism 08/07/2019   Other muscle spasm 08/07/2019   Unspecified protein-calorie malnutrition ([place]) 08/07/2019   Difficulty in walking, not elsewhere classified 07/18/2019   Dysphagia, oropharyngeal phase 07/18/2019   Atypical atrial flutter (HCC)    Paroxysmal atrial fibrillation ([place]) 05/24/2018   Blister (nonthermal), unspecified lower leg, initial encounter 03/24/2018   Cellulitis, unspecified 03/24/2018   Diarrhea, unspecified 03/24/2018   Epistaxis 03/24/2018   Immune thrombocytopenic purpura ([place]) 03/24/2018   Major depressive disorder, recurrent, mild ([place]) 03/24/2018   Nausea with vomiting, unspecified 03/24/2018   Intercostal pain 03/24/2018   Atherosclerotic heart disease of native coronary artery without angina pectoris 01/13/2018   Candidal vulvovaginitis 01/13/2018   Dental caries, unspecified 01/13/2018   Hyperlipidemia, unspecified 01/13/2018   Hypertensive heart and chronic kidney disease with heart failure and stage 1 through stage 4 chronic kidney disease, or unspecified chronic kidney disease ([place]) 01/13/2018   Long term (current) use of insulin ([place]) 01/13/2018  Muscle weakness (generalized) 01/13/2018   Other adverse food reactions, not elsewhere classified, initial encounter 01/13/2018   Other chronic sinusitis 01/13/2018   Other lack of coordination 01/13/2018   Klebsiella  pneumoniae (k. pneumoniae) as the cause of diseases classified elsewhere 01/13/2018   Postprocedural hypothyroidism 01/13/2018   Unsteadiness on feet 01/13/2018   Urinary tract infection, site not specified 01/13/2018   Normocytic anemia 05/18/2017   Chronic ITP (idiopathic thrombocytopenia) ([place]) 01/25/2017   Hypercalciuria 01/15/2016   Hyperoxaluria 01/15/2016   Chronic kidney disease (CKD) stage G4/A1, severely decreased glomerular filtration rate (GFR) between 15-29 mL/min/1.73 square meter and albuminuria creatinine ratio less than 30 mg/g (HCC) 09/04/2015   Decreased urine volume 07/10/2015   Hypocitraturia 07/10/2015   Physical deconditioning 10/11/2014   Latex allergy, contact dermatitis 10/09/2014   Primary osteoarthritis of left knee 09/17/2014   Clostridium difficile carrier 06/07/2014   Constipation, unspecified 06/07/2012   Nutritional deficiency, unspecified 06/07/2012   Pain, unspecified 06/07/2012   Tachycardia 06/07/2012   Dyslipidemia 02/12/2012   GERD (gastroesophageal reflux disease) 02/12/2012   DM type 2 (diabetes mellitus, type 2) ([place]) 08/05/2010   GOUT 06/23/2010   DERMATITIS 01/16/2010   BACK PAIN 06/13/2009   MYALGIA 06/13/2009   Anxiety state 12/13/2008   LEG EDEMA, CHRONIC 11/08/2008   Sleep apnea 10/26/2008   Hypothyroidism 09/27/2008   HYPERCHOLESTEROLEMIA 09/27/2008   OBESITY 09/27/2008   Depression, recurrent ([place]) 09/27/2008   Essential hypertension 09/27/2008   Allergic rhinitis 09/27/2008   ASTHMA, CHILDHOOD 09/27/2008    Past Surgical History:  Procedure Laterality Date   CARDIOVERSION N/A 06/26/2019   Procedure: CARDIOVERSION;  Surgeon: Jerline Pain, MD;  Location: [facility];  Service: Cardiovascular;  Laterality: N/A;   EYE SURGERY Bilateral 2005   cataracts, implants   KIDNEY STONE SURGERY Bilateral    2016, 2017    knee replaced Left 2013   THYROID SURGERY  1966   nodule removal; partial thyroidectomy; radioactive iodine  x2; 1990's     OB History   No obstetric history on file.     Family History  Problem Relation Age of Onset   Hypertension Father    Hyperlipidemia Father    Allergies Mother    Clotting disorder Mother    Osteoarthritis Mother    Asthma Mother    Hypertension Mother    Stroke Mother    Coronary artery disease Other    Stroke Other    Rheum arthritis Maternal Grandmother    Clotting disorder Maternal Grandmother    Clotting disorder Maternal Uncle    Arthritis Daughter    Diabetes Daughter    High blood pressure Daughter    High Cholesterol Daughter    Depression Maternal Grandfather    Diabetes Paternal Grandmother    Breast cancer Neg Hx     Social History   Tobacco Use   Smoking status: Former    Packs/day: 0.25    Years: 20.00    Pack years: 5.00    Types: Cigarettes    Quit date: 09/07/1989    Years since quitting: 31.5   Smokeless tobacco: Never  Vaping Use   Vaping Use: Never used  Substance Use Topics   Alcohol use: No   Drug use: No    Home Medications Prior to Admission medications   Medication Sig Start Date End Date Taking? Authorizing Provider  acetaminophen (TYLENOL) 325 MG tablet Take 650 mg by mouth in the morning and at bedtime.    [provider]  amLODipine (  NORVASC) 2.5 MG tablet Take 3 tablets (7.5 mg total) by mouth daily. 02/18/21   Caren Macadam, MD  amoxicillin (AMOXIL) 500 MG tablet 500 mg as needed. 02/18/21   [provider]  atorvastatin (LIPITOR) 40 MG tablet Take 1 tablet (40 mg total) by mouth at bedtime. 03/06/21   Caren Macadam, MD  BD PEN NEEDLE NANO 2ND GEN 32G X 4 MM MISC USE TO INJECT UP TO FOUR TIMES DAILY AS DIRECTED 02/27/21   Philemon Kingdom, MD  bisacodyl (DULCOLAX) 5 MG EC tablet Take 10 mg by mouth every 3 (three) days as needed (for constipation).    [provider]  Bismuth Tribromoph-Petrolatum (XEROFORM PETROLAT PATCH 4"X4") PADS Apply 1 Units topically daily as needed.  11/08/20   Caren Macadam, MD  Cholecalciferol (VITAMIN D3) 2000 units TABS Take 2,000 Units by mouth daily.    [provider]  Continuous Blood Gluc Sensor (FREESTYLE LIBRE 14 DAY SENSOR) MISC APPLY NEW SENSOR EVERY 14 DAYS AS DIRECTED 12/16/20   Philemon Kingdom, MD  fexofenadine (ALLEGRA) 180 MG tablet Take 180 mg by mouth daily. 01/04/20   [provider]  furosemide (LASIX) 20 MG tablet TAKE 1 TABLET BY MOUTH 4 TIMES EVERY WEEK, ALTERNATING WITH 40 MG THREE TIMES EVERY WEEK 12/03/20   Koberlein, Junell C, MD  furosemide (LASIX) 40 MG tablet TAKE 1 TABLET BY MOUTH THREE TIMES WEEKLY, ALTERNATING WITH 20MG  FOUR TIMES WEEKLY 06/11/20   Koberlein, Steele Berg, MD  HUMALOG KWIKPEN 100 UNIT/ML KwikPen INJECT 13 TO 20 UNITS UNDER THE SKIN THREE TIMES DAILY WITH BREAKFAST, LUNCH AND EVENING MEAL 12/16/20   Philemon Kingdom, MD  hyoscyamine (LEVSIN) 0.125 MG tablet Take 1 tablet by mouth daily. 01/06/20   [provider]  Insulin Syringe-Needle U-100 (INSULIN SYRINGE .3CC/29GX1/2") 29G X 1/2" 0.3 ML MISC Use to inject insulin 4 times a day. 01/02/20   Philemon Kingdom, MD  LANTUS SOLOSTAR 100 UNIT/ML Solostar Pen ADMINISTER 50 UNITS UNDER THE SKIN DAILY 10/28/20   Philemon Kingdom, MD  levothyroxine (SYNTHROID) 112 MCG tablet Take 1 tablet (112 mcg total) by mouth daily. 10/28/20   Philemon Kingdom, MD  metoprolol succinate (TOPROL XL) 25 MG 24 hr tablet Take 1 tablet (25 mg total) by mouth daily. 03/05/21   Imogene Burn, PA-C  Multiple Vitamin (MULTIVITAMIN) tablet Take 1 tablet by mouth daily.    [provider]  potassium citrate (UROCIT-K) 10 MEQ (1080 MG) SR tablet Take 20 mEq by mouth in the morning and at bedtime. 05/01/20 05/01/21  [provider]  sertraline (ZOLOFT) 50 MG tablet TAKE 1 TABLET BY MOUTH AT BEDTIME 05/14/20   Koberlein, Steele Berg, MD  vitamin B-12 (CYANOCOBALAMIN) 100 MCG tablet Take 100 mcg by mouth daily.    [provider]   vitamin C (ASCORBIC ACID) 250 MG tablet Take 250 mg by mouth daily.    [provider]    Allergies    Adhesive [tape], Apixaban, Aspirin, Mirabegron, Pineapple, Metformin, Tetracycline, Fluticasone-salmeterol, Iodinated diagnostic agents, Lactose intolerance (gi), Latex, Lisinopril, Metronidazole, Sulfa antibiotics, and Sulfonamide derivatives  Review of Systems   Review of Systems  Constitutional:  Negative for chills and fever.  HENT:  Negative for ear pain and sore throat.   Eyes:  Negative for pain and visual disturbance.  Respiratory:  Positive for shortness of breath. Negative for cough.   Cardiovascular:  Positive for leg swelling. Negative for chest pain and palpitations.  Gastrointestinal:  Negative for abdominal pain and  vomiting.  Genitourinary:  Negative for dysuria and hematuria.  Musculoskeletal:  Negative for arthralgias and back pain.  Skin:  Negative for color change and rash.  Neurological:  Negative for seizures and syncope.  All other systems reviewed and are negative.  Physical Exam Updated Vital Signs BP (!) 187/73 (BP Location: Right Arm)   Pulse 73   Temp 98.4 F (36.9 C) (Oral)   Resp (!) 22   LMP  (LMP Unknown)   SpO2 (!) 87%   Physical Exam Vitals and nursing note reviewed.  Constitutional:      General: She is not in acute distress.    Appearance: She is well-developed. She is not ill-appearing.  HENT:     Head: Normocephalic and atraumatic.     Mouth/Throat:     Mouth: Mucous membranes are moist.  Eyes:     Extraocular Movements: Extraocular movements intact.     Conjunctiva/sclera: Conjunctivae normal.     Pupils: Pupils are equal, round, and reactive to light.  Cardiovascular:     Rate and Rhythm: Normal rate and regular rhythm.     Pulses: Normal pulses.     Heart sounds: Normal heart sounds. No murmur heard. Pulmonary:     Effort: Pulmonary effort is normal. Tachypnea present. No respiratory distress.     Breath sounds:  Decreased breath sounds and rales present.  Abdominal:     Palpations: Abdomen is soft.     Tenderness: There is no abdominal tenderness.  Musculoskeletal:     Cervical back: Neck supple.     Right lower leg: Edema present.     Left lower leg: Edema present.  Skin:    General: Skin is warm and dry.     Capillary Refill: Capillary refill takes less than 2 seconds.  Neurological:     General: No focal deficit present.     Mental Status: She is alert.    ED Results / Procedures / Treatments   Labs (all labs ordered are listed, but only abnormal results are displayed) Labs Reviewed  CBC WITH DIFFERENTIAL/PLATELET - Abnormal; Notable for the following components:      Result Value   RBC 3.52 (*)    Hemoglobin 10.9 (*)    HCT 33.5 (*)    RDW 17.2 (*)    Platelets 136 (*)    All other components within normal limits  BASIC METABOLIC PANEL - Abnormal; Notable for the following components:   Glucose, Bld 106 (*)    BUN 66 (*)    Creatinine, Ser 2.27 (*)    GFR, Estimated 21 (*)    All other components within normal limits  BRAIN NATRIURETIC PEPTIDE - Abnormal; Notable for the following components:   B Natriuretic Peptide 111.6 (*)    All other components within normal limits  RESP PANEL BY RT-PCR (FLU A&B, COVID) ARPGX2  OSMOLALITY, URINE  SODIUM, URINE, RANDOM  TROPONIN I (HIGH SENSITIVITY)  TROPONIN I (HIGH SENSITIVITY)    EKG EKG Interpretation  Date/Time:  Friday March 14 2021 15:38:15 EDT Ventricular Rate:  73 PR Interval:    QRS Duration: 96 QT Interval:  438 QTC Calculation: 482 R Axis:   91 Text Interpretation: Atrial flutter with variable A-V block Rightward axis Low voltage QRS Abnormal ECG Confirmed by Ronnald Nian, Janese Radabaugh ([id number]) on 03/14/2021 6:31:40 PM  Radiology DG Chest 2 View  Result Date: 03/14/2021 CLINICAL DATA:  Shortness of breath EXAM: CHEST - 2 VIEW COMPARISON:  01/30/2021, CT 07/04/2019 FINDINGS: Cardiomegaly with small right  greater than left bilateral  effusions. Vascular congestion and mild diffuse ground-glass opacity likely pulmonary edema. Consolidation at right base. Aortic atherosclerosis. No pneumothorax. IMPRESSION: Cardiomegaly with small bilateral effusions, vascular congestion and mild pulmonary edema. Atelectasis or pneumonia at the right base. Electronically Signed   By: Donavan Foil M.D.   On: 03/14/2021 15:54    Procedures Procedures   Medications Ordered in ED Medications  furosemide (LASIX) injection 40 mg (40 mg Intravenous Given 03/14/21 1849)    ED Course  I have reviewed the triage vital signs and the nursing notes.  Pertinent labs & imaging results that were available during my care of the patient were reviewed by me and considered in my medical decision making (see chart for details).    MDM Rules/Calculators/A&P                          Mackenzey Crownover is here with shortness of breath, fluid retention.  Patient hypoxic in the 80s and placed on 2 L of oxygen.  History of heart failure, atrial flutter not on anticoagulation, PE.  Patient has had 25 pound weight gain according to cardiology note and was sent here for evaluation.  Chest x-ray shows pulmonary congestion with effusions.  Clinically she appears volume overloaded.  She states she does not get great response from her oral Lasix at home but they have been having a hard time with her Lexis given her chronic kidney disease.  Overall multifactorial shortness of breath likely secondary to heart failure chronic kidney disease.  Also from uncontrolled hypertension as her blood pressure still elevated.  She states that she was admitted recently and responded well to IV Lasix.  Has never had a thoracentesis done before.  Lab work was overall unremarkable except for some mild elevation of her BNP.  EKG showed atrial flutter with no ischemic changes.  Have a low suspicion for PE given that this is likely volume overload.  She did have a VQ scan several months ago that  was unremarkable.  Patient to be admitted to medicine for further diuresis.  No concern for infectious process.  This chart was dictated using voice recognition software.  Despite best efforts to proofread,  errors can occur which can change the documentation meaning.   Final Clinical Impression(s) / ED Diagnoses Final diagnoses:  Hypervolemia, unspecified hypervolemia type  Pleural effusion  Acute on chronic heart failure, unspecified heart failure type [facility])    Rx / DC Orders ED Discharge Orders     None        Lennice Sites, DO 03/14/21 1909

## 2021-03-14 NOTE — Telephone Encounter (Signed)
Routing to [facility] for scheduling

## 2021-03-14 NOTE — Telephone Encounter (Signed)
Appointment canceled will close encounter.

## 2021-03-14 NOTE — Telephone Encounter (Signed)
Spoke with pt daughter (per dpr) who stated pt's O2 saturation dropped to 70% while in A-fib clinic. Pt is currently in ED at [facility].Daughter stated doctor at [facility] looked at CXR and noted bilateral pleural effusions and "fluid around lungs"  Routing to Dr. Elsworth Soho as Juluis Rainier. Nothing further needed at this time.

## 2021-03-14 NOTE — H&P (Signed)
History and Physical  Patient Name: [name]     DJT:[id number]    DOB: 14-Apr-1935    DOA: 03/14/2021 PCP: Caren Macadam, MD  Patient coming from: Home  Chief Complaint: Dyspnea, volume overload    HPI: [name] is a 86 y.o. female, with PMH of a flutter, PE, not on anticoagulation due to thrombocytopenia and history of bleeding of unknown source, diastolic CHF, CKD 4, who presented to the ER on 03/14/2021 with volume overload, dyspnea.  Patient states for the past week or so she has had progressive, worsening dyspnea.  She said she checked her pulse ox this morning and 78.  She went to the [facility] and sounds like her to get x-ray that showed worsening pulmonary edema.  The provider there was concerned that she might need thoracentesis and advised to go to the ER.  Patient states she has gained 12 pounds since she was hospitalized in May for acute on chronic diastolic CHF.  She saw her oncologist yesterday and it was noted that she desatted with ambulation, and recommended she get oxygen for home.  Apparently she has a pulmonology appointment coming up.  At home she is on alternating Lasix, 20 mg and 40 mg daily.  She says she drinks a lot of water at home, 8 glasses, because she gets thirsty.  She says she is compliant with her home medication regiment.    ED course: -Vitals on admission: Afebrile, heart rate 73, respiratory rate 22, blood pressure 151/67, sats 87% on room air -Labs on initial presentation: Sodium 142, potassium 4.2, chloride 104, bicarb 28, glucose 106, BUN 66, creatinine 2.27, hemoglobin 10.9, WBC 5.3, COVID-negative -Imaging obtained on admission: Chest x-ray demonstrates signs of volume overload, cardiomegaly -In the ED the patient was given 40 of IV Lasix, and the hospitalist service was contacted for further evaluation and management.     ROS: A complete and thorough 12 point review of systems obtained, negative listed in  HPI.     Past Medical History:  Diagnosis Date   Allergic rhinitis    Anxiety state, unspecified    panic attacks   CHF (congestive heart failure) (HCC)    Depressive disorder, not elsewhere classified    Extrinsic asthma, unspecified    no problem since adulthood   History of complications due to general anesthesia 03/03/2021   Obesity    OSA on CPAP    severe   Pneumonitis 07/14/2019   Pressure injury of skin 07/10/2019   Pulmonary embolism ([place])    Pure hypercholesterolemia    Respiratory failure with hypoxia ([place]) 09/2008   acute, secondary to multiple bilateral pulmonary embolism , negative hypercoagulable workup 09/2008 hospital stay   Scoliosis    Type II or unspecified type diabetes mellitus without mention of complication, not stated as uncontrolled    Unspecified hypothyroidism    hypo    Past Surgical History:  Procedure Laterality Date   CARDIOVERSION N/A 06/26/2019   Procedure: CARDIOVERSION;  Surgeon: Jerline Pain, MD;  Location: [facility];  Service: Cardiovascular;  Laterality: N/A;   EYE SURGERY Bilateral 2005   cataracts, implants   KIDNEY STONE SURGERY Bilateral    2016, 2017    knee replaced Left 2013   THYROID SURGERY  1966   nodule removal; partial thyroidectomy; radioactive iodine x2; 1990's    Social History: Patient lives at home.  The patient walks walker.  Non smoker.  Allergies  Allergen Reactions   Adhesive [  Tape] Other (See Comments)    PULLS OFF THE SKIN   Apixaban Other (See Comments)    Internal Bleeding   Aspirin Itching, Rash, Hives and Swelling    Swelling of her tongue   Mirabegron Other (See Comments)    Patient experienced A-Fib   Pineapple Anaphylaxis and Swelling    Throat swells and blisters on tongue and roof of mouth per patient   Metformin Diarrhea and Nausea Only   Tetracycline Hives   Fluticasone-Salmeterol Itching and Rash   Iodinated Diagnostic Agents Itching and Rash        Lactose Intolerance (Gi) Other  (See Comments)    Gas    Latex Itching, Rash and Other (See Comments)    Pulls off the skin and causes welts   Lisinopril Cough   Metronidazole Other (See Comments)    Reaction not known   Sulfa Antibiotics Rash   Sulfonamide Derivatives Itching and Rash    Family history: family history includes Allergies in her mother; Arthritis in her daughter; Asthma in her mother; Clotting disorder in her maternal grandmother, maternal uncle, and mother; Coronary artery disease in an other family member; Depression in her maternal grandfather; Diabetes in her daughter and paternal grandmother; High Cholesterol in her daughter; High blood pressure in her daughter; Hyperlipidemia in her father; Hypertension in her father and mother; Osteoarthritis in her mother; Rheum arthritis in her maternal grandmother; Stroke in her mother and another family member.  Prior to Admission medications   Medication Sig Start Date End Date Taking? Authorizing Provider  acetaminophen (TYLENOL) 325 MG tablet Take 650 mg by mouth in the morning and at bedtime.   Yes [provider]  amLODipine (NORVASC) 2.5 MG tablet Take 3 tablets (7.5 mg total) by mouth daily. 02/18/21  Yes Koberlein, Junell C, MD  amoxicillin (AMOXIL) 500 MG tablet Take 2,000 mg by mouth as needed. Prior to Dental Appt 02/18/21  Yes [provider]  atorvastatin (LIPITOR) 40 MG tablet Take 1 tablet (40 mg total) by mouth at bedtime. 03/06/21  Yes Koberlein, Steele Berg, MD  bisacodyl (DULCOLAX) 5 MG EC tablet Take 10 mg by mouth every 3 (three) days as needed (for constipation).   Yes [provider]  Cholecalciferol (VITAMIN D3) 2000 units TABS Take 2,000 Units by mouth daily.   Yes [provider]  fexofenadine (ALLEGRA) 180 MG tablet Take 180 mg by mouth daily. 01/04/20  Yes [provider]  furosemide (LASIX) 20 MG tablet TAKE 1 TABLET BY MOUTH 4 TIMES EVERY WEEK, ALTERNATING WITH 40 MG THREE TIMES EVERY WEEK 12/03/20   Yes Koberlein, Junell C, MD  furosemide (LASIX) 40 MG tablet TAKE 1 TABLET BY MOUTH THREE TIMES WEEKLY, ALTERNATING WITH 20MG  FOUR TIMES WEEKLY 06/11/20  Yes Koberlein, Junell C, MD  HUMALOG KWIKPEN 100 UNIT/ML KwikPen INJECT 13 TO 20 UNITS UNDER THE SKIN THREE TIMES DAILY WITH BREAKFAST, LUNCH AND EVENING MEAL 12/16/20  Yes Philemon Kingdom, MD  hyoscyamine (LEVSIN) 0.125 MG tablet Take 1 tablet by mouth daily. 01/06/20  Yes [provider]  LANTUS SOLOSTAR 100 UNIT/ML Solostar Pen ADMINISTER 50 UNITS UNDER THE SKIN DAILY Patient taking differently: Inject 46 Units into the skin at bedtime. 10/28/20  Yes Philemon Kingdom, MD  levothyroxine (SYNTHROID) 112 MCG tablet Take 1 tablet (112 mcg total) by mouth daily. 10/28/20  Yes Philemon Kingdom, MD  metoprolol succinate (TOPROL XL) 25 MG 24 hr tablet Take 1 tablet (25 mg total) by mouth daily. 03/05/21  Yes Lenze,  Jennye Moccasin, PA-C  Multiple Vitamin (MULTIVITAMIN) tablet Take 1 tablet by mouth daily.   Yes [provider]  potassium citrate (UROCIT-K) 10 MEQ (1080 MG) SR tablet Take 20 mEq by mouth in the morning and at bedtime. 05/01/20 05/01/21 Yes [provider]  sertraline (ZOLOFT) 50 MG tablet TAKE 1 TABLET BY MOUTH AT BEDTIME 05/14/20  Yes Koberlein, Junell C, MD  vitamin B-12 (CYANOCOBALAMIN) 100 MCG tablet Take 100 mcg by mouth daily.   Yes [provider]  vitamin C (ASCORBIC ACID) 250 MG tablet Take 250 mg by mouth daily.   Yes [provider]  BD PEN NEEDLE NANO 2ND GEN 32G X 4 MM MISC USE TO INJECT UP TO FOUR TIMES DAILY AS DIRECTED 02/27/21   Philemon Kingdom, MD  Bismuth Tribromoph-Petrolatum (XEROFORM PETROLAT PATCH 4"X4") PADS Apply 1 Units topically daily as needed. 11/08/20   Koberlein, Steele Berg, MD  Continuous Blood Gluc Sensor (FREESTYLE LIBRE 14 DAY SENSOR) MISC APPLY NEW SENSOR EVERY 14 DAYS AS DIRECTED 12/16/20   Philemon Kingdom, MD  Insulin Syringe-Needle U-100 (INSULIN SYRINGE .3CC/29GX1/2")  29G X 1/2" 0.3 ML MISC Use to inject insulin 4 times a day. 01/02/20   Philemon Kingdom, MD       Physical Exam: BP (!) 139/58   Pulse 62   Temp 98.4 F (36.9 C) (Oral)   Resp 20   LMP  (LMP Unknown)   SpO2 100%   General appearance: Well-developed, adult female, alert and in no acute distress .   Eyes: Anicteric, conjunctiva pink, lids and lashes normal. PERRL.    ENT: No nasal deformity, discharge, epistaxis.  Hearing intact. OP moist without lesions.   Neck: No neck masses.  Trachea midline.  No thyromegaly/tenderness. Lymph: No cervical or supraclavicular lymphadenopathy. Skin: Warm and dry.  No jaundice.  No suspicious rashes or lesions. Cardiac: RRR, nl S1-S2, no murmurs appreciated.  1/2+ LE edema.  Radial and pedal pulses 2+ and symmetric. Respiratory: Coarse breath sounds bilaterally, no wheezing Abdomen: Abdomen soft.  No tenderness with palpation. No ascites, distension, hepatosplenomegaly.   MSK: No deformities or effusions of the large joints of the upper or lower extremities bilaterally.  No cyanosis or clubbing. Neuro: Cranial nerves 2 through 12 grossly intact.  Sensation intact to light touch. Speech is fluent.  Marland Kitchen    Psych: Sensorium intact and responding to questions, attention normal.  Behavior appropriate.  Judgment and insight appear normal.    Labs on Admission:  I have personally reviewed following labs and imaging studies: CBC: Recent Labs  Lab 03/13/21 1206 03/14/21 1605  WBC 6.0 5.3  NEUTROABS 4.4 3.7  HGB 11.0* 10.9*  HCT 34.8* 33.5*  MCV 94.8 95.2  PLT 134* 725*   Basic Metabolic Panel: Recent Labs  Lab 03/13/21 1206 03/14/21 1605  NA 137 142  K 4.3 4.2  CL 99 104  CO2 30 28  GLUCOSE 197* 106*  BUN 61* 66*  CREATININE 2.20* 2.27*  CALCIUM 10.1 9.1   GFR: Estimated Creatinine Clearance: 20.6 mL/min (A) (by C-G formula based on SCr of 2.27 mg/dL (H)).  Liver Function Tests: Recent Labs  Lab 03/13/21 1206  AST 11*  ALT 11   ALKPHOS 68  BILITOT 0.6  PROT 7.1  ALBUMIN 4.3   No results for input(s): LIPASE, AMYLASE in the last 168 hours. No results for input(s): AMMONIA in the last 168 hours. Coagulation Profile: No results for input(s): INR, PROTIME in the last 168 hours. Cardiac Enzymes: No  results for input(s): CKTOTAL, CKMB, CKMBINDEX, TROPONINI in the last 168 hours. BNP (last 3 results) Recent Labs    02/17/21 1418  PROBNP 70.0   HbA1C: No results for input(s): HGBA1C in the last 72 hours. CBG: No results for input(s): GLUCAP in the last 168 hours. Lipid Profile: No results for input(s): CHOL, HDL, LDLCALC, TRIG, CHOLHDL, LDLDIRECT in the last 72 hours. Thyroid Function Tests: No results for input(s): TSH, T4TOTAL, FREET4, T3FREE, THYROIDAB in the last 72 hours. Anemia Panel: Recent Labs    03/13/21 1205  FERRITIN 290  TIBC 316  IRON 72     Recent Results (from the past 240 hour(s))  Resp Panel by RT-PCR (Flu A&B, Covid) Nasopharyngeal Swab     Status: None   Collection Time: 03/14/21  6:47 PM   Specimen: Nasopharyngeal Swab; Nasopharyngeal(NP) swabs in vial transport medium  Result Value Ref Range Status   SARS Coronavirus 2 by RT PCR NEGATIVE NEGATIVE Final    Comment: (NOTE) SARS-CoV-2 target nucleic acids are NOT DETECTED.  The SARS-CoV-2 RNA is generally detectable in upper respiratory specimens during the acute phase of infection. The lowest concentration of SARS-CoV-2 viral copies this assay can detect is 138 copies/mL. A negative result does not preclude SARS-Cov-2 infection and should not be used as the sole basis for treatment or other patient management decisions. A negative result may occur with  improper specimen collection/handling, submission of specimen other than nasopharyngeal swab, presence of viral mutation(s) within the areas targeted by this assay, and inadequate number of viral copies(<138 copies/mL). A negative result must be combined with clinical  observations, patient history, and epidemiological information. The expected result is Negative.  Fact Sheet for Patients:  EntrepreneurPulse.com.au  Fact Sheet for Healthcare Providers:  IncredibleEmployment.be  This test is no t yet approved or cleared by the [place] FDA and  has been authorized for detection and/or diagnosis of SARS-CoV-2 by FDA under an Emergency Use Authorization (EUA). This EUA will remain  in effect (meaning this test can be used) for the duration of the COVID-19 declaration under Section 564(b)(1) of the Act, 21 U.S.C.section 360bbb-3(b)(1), unless the authorization is terminated  or revoked sooner.       Influenza A by PCR NEGATIVE NEGATIVE Final   Influenza B by PCR NEGATIVE NEGATIVE Final    Comment: (NOTE) The Xpert Xpress SARS-CoV-2/FLU/RSV plus assay is intended as an aid in the diagnosis of influenza from Nasopharyngeal swab specimens and should not be used as a sole basis for treatment. Nasal washings and aspirates are unacceptable for Xpert Xpress SARS-CoV-2/FLU/RSV testing.  Fact Sheet for Patients: EntrepreneurPulse.com.au  Fact Sheet for Healthcare Providers: IncredibleEmployment.be  This test is not yet approved or cleared by the [place] FDA and has been authorized for detection and/or diagnosis of SARS-CoV-2 by FDA under an Emergency Use Authorization (EUA). This EUA will remain in effect (meaning this test can be used) for the duration of the COVID-19 declaration under Section 564(b)(1) of the Act, 21 U.S.C. section 360bbb-3(b)(1), unless the authorization is terminated or revoked.  Performed at [facility], [place] [address], [place],  [address]            Radiological Exams on Admission: Personally reviewed imaging which shows: Chest x-ray demonstrates signs of volume overload, cardiomegaly DG Chest 2 View  Result  Date: 03/14/2021 CLINICAL DATA:  Shortness of breath EXAM: CHEST - 2 VIEW COMPARISON:  01/30/2021, CT 07/04/2019 FINDINGS: Cardiomegaly with small right greater than left bilateral  effusions. Vascular congestion and mild diffuse ground-glass opacity likely pulmonary edema. Consolidation at right base. Aortic atherosclerosis. No pneumothorax. IMPRESSION: Cardiomegaly with small bilateral effusions, vascular congestion and mild pulmonary edema. Atelectasis or pneumonia at the right base. Electronically Signed   By: Donavan Foil M.D.   On: 03/14/2021 15:54           Assessment/Plan   1.  Acute on chronic diastolic CHF -Chest x-ray demonstrates signs of volume overload, cardiomegaly - Echocardiogram May 2022 demonstrated EF of 60 to 65%, moderate concentric left ventricular hypertrophy - Strict I's and O's, daily weights - Started on Lasix 80 mg IV Q12H -Low-salt and fluid restricted diet  2.  Acute hypoxic respiratory failure -On admission patient satting 87% on room air -Likely due to decompensated HFpEF.  However could have underlying lung disease, scheduled to follow-up with pulmonologist in the next few weeks - Continue O2 supplementation, wean as tolerated -Recommend ambulatory O2 testing prior to discharge  3.  Essential hypertension -Blood pressure slightly above goal - Resume home blood pressure medications once reconciled  4.  Insulin-dependent type 2 diabetes -Hemoglobin A1c 7.1 in May 2022 -Continue home long-acting insulin -Glucose checks, sliding scale  5.  CKD 4 -Baseline creatinine appears around 1.8 - Admission creatinine slightly above baseline at 2.2 - Urinalysis and urine studies pending - Follow-up labs ordered - Avoid nephrotoxins, IV contrast, NSAIDs  6.  Hypothyroidism -Continue home regiment     DVT prophylaxis: SCDs Code Status: DNR Family Communication: Daughter bedside Disposition Plan: Anticipate discharge home when medically  optimized Consults called: None Admission status: Inpatient      Medical decision making: Patient seen at 9:20 PM on 03/14/2021.  The patient was discussed with ER provider.  What exists of the patient's chart was reviewed in depth and summarized above.  Clinical condition: Not medically optimized.        Doran Heater [facility] Please page though [place] or Epic secure chat:  For password, contact charge nurse

## 2021-03-15 DIAGNOSIS — J9600 Acute respiratory failure, unspecified whether with hypoxia or hypercapnia: Secondary | ICD-10-CM

## 2021-03-15 DIAGNOSIS — I484 Atypical atrial flutter: Secondary | ICD-10-CM

## 2021-03-15 DIAGNOSIS — N184 Chronic kidney disease, stage 4 (severe): Secondary | ICD-10-CM

## 2021-03-15 DIAGNOSIS — I10 Essential (primary) hypertension: Secondary | ICD-10-CM

## 2021-03-15 LAB — COMPREHENSIVE METABOLIC PANEL
ALT: 15 U/L (ref 0–44)
AST: 16 U/L (ref 15–41)
Albumin: 3.8 g/dL (ref 3.5–5.0)
Alkaline Phosphatase: 63 U/L (ref 38–126)
Anion gap: 8 (ref 5–15)
BUN: 63 mg/dL — ABNORMAL HIGH (ref 8–23)
CO2: 31 mmol/L (ref 22–32)
Calcium: 9.2 mg/dL (ref 8.9–10.3)
Chloride: 104 mmol/L (ref 98–111)
Creatinine, Ser: 2.2 mg/dL — ABNORMAL HIGH (ref 0.44–1.00)
GFR, Estimated: 22 mL/min — ABNORMAL LOW (ref >60)
Glucose, Bld: 155 mg/dL — ABNORMAL HIGH (ref 70–99)
Potassium: 4.5 mmol/L (ref 3.5–5.1)
Sodium: 143 mmol/L (ref 135–145)
Total Bilirubin: 0.8 mg/dL (ref 0.3–1.2)
Total Protein: 7 g/dL (ref 6.5–8.1)

## 2021-03-15 LAB — MAGNESIUM: Magnesium: 2.1 mg/dL (ref 1.7–2.4)

## 2021-03-15 LAB — CBC
HCT: 32.5 % — ABNORMAL LOW (ref 36.0–46.0)
Hemoglobin: 10.2 g/dL — ABNORMAL LOW (ref 12.0–15.0)
MCH: 30 pg (ref 26.0–34.0)
MCHC: 31.4 g/dL (ref 30.0–36.0)
MCV: 95.6 fL (ref 80.0–100.0)
Platelets: 136 10*3/uL — ABNORMAL LOW (ref 150–400)
RBC: 3.4 MIL/uL — ABNORMAL LOW (ref 3.87–5.11)
RDW: 17.2 % — ABNORMAL HIGH (ref 11.5–15.5)
WBC: 5 10*3/uL (ref 4.0–10.5)
nRBC: 0 % (ref 0.0–0.2)

## 2021-03-15 LAB — URINALYSIS, ROUTINE W REFLEX MICROSCOPIC
Bilirubin Urine: NEGATIVE
Glucose, UA: NEGATIVE mg/dL
Hgb urine dipstick: NEGATIVE
Ketones, ur: NEGATIVE mg/dL
Nitrite: NEGATIVE
Protein, ur: 100 mg/dL — AB
Specific Gravity, Urine: 1.006 (ref 1.005–1.030)
WBC, UA: >50 WBC/hpf — ABNORMAL HIGH (ref 0–5)
pH: 5 (ref 5.0–8.0)

## 2021-03-15 LAB — PHOSPHORUS: Phosphorus: 5.2 mg/dL — ABNORMAL HIGH (ref 2.5–4.6)

## 2021-03-15 LAB — GLUCOSE, CAPILLARY
Glucose-Capillary: 191 mg/dL — ABNORMAL HIGH (ref 70–99)
Glucose-Capillary: 186 mg/dL — ABNORMAL HIGH (ref 70–99)

## 2021-03-15 LAB — SODIUM, URINE, RANDOM: Sodium, Ur: 115 mmol/L

## 2021-03-15 LAB — CBG MONITORING, ED
Glucose-Capillary: 140 mg/dL — ABNORMAL HIGH (ref 70–99)
Glucose-Capillary: 172 mg/dL — ABNORMAL HIGH (ref 70–99)

## 2021-03-15 LAB — OSMOLALITY, URINE: Osmolality, Ur: 341 mOsm/kg (ref 300–900)

## 2021-03-15 NOTE — Progress Notes (Addendum)
PROGRESS NOTE  [name] GUY:[id number] DOB: 1935/03/09 DOA: 03/14/2021 PCP: Caren Macadam, MD  HPI/Recap of past 80 hours: 86 year old admitted for heart failure exacerbation. Patient seen and examined at bedside denies any chest pain.  Assessment/Plan: Active Problems:   Hypothyroidism   DM type 2 (diabetes mellitus, type 2) (HCC)   HYPERCHOLESTEROLEMIA   Anxiety state   Essential hypertension   Acute respiratory failure (HCC)   Chronic kidney disease (CKD) stage G4/A1, severely decreased glomerular filtration rate (GFR) between 15-29 mL/min/1.73 square meter and albuminuria creatinine ratio less than 30 mg/g (HCC)   Atypical atrial flutter (HCC)   Acute on chronic diastolic (congestive) heart failure ([place])  1.  Acute CHF exacerbation with acute respiratory failure and hypoxia  requiring 2 L/min of oxygen continue IV diuresis   2.  Hypothyroidism  continue levothyroxine  3.  Hypercholesterolemia  continue atorvastatin  4.  Diabetes mellitus  continue Lantus and sliding scale  5.  Urinary tract infection.   We will give a dose of ceftriaxone  6.  Chronic kidney disease stage IVa  we will avoid nephrotoxic drug gentle diuresis  7.  Hypertension  continue metoprolol and amlodipine  8.  Atrial fibrillation  continue metoprolol  Code Status:  DNR  Severity of Illness: The appropriate patient status for this patient is INPATIENT. Inpatient status is judged to be reasonable and necessary in order to provide the required intensity of service to ensure the patient's safety. The patient's presenting symptoms, physical exam findings, and initial radiographic and laboratory data in the context of their chronic comorbidities is felt to place them at high risk for further clinical deterioration. Furthermore, it is not anticipated that the patient will be medically stable for discharge from the hospital within 2 midnights of admission. The following factors  support the patient status of inpatient.  .  Thing IV therapy that cannot be given orally * I certify that at the point of admission it is my clinical judgment that the patient will require inpatient hospital care spanning beyond 2 midnights from the point of admission due to high intensity of service, high risk for further deterioration and high frequency of surveillance required.*   Family Communication: Patient  Disposition Plan: Discharge when stable and safe for discharge at this time   Consultants: None  Procedures: None  Antimicrobials: None  DVT prophylaxis: Lovenox   Objective: Vitals:   03/15/21 0215 03/15/21 0500 03/15/21 0600 03/15/21 0900  BP: (!) 149/68 (!) 133/50 (!) 130/58 (!) 148/56  Pulse: 62 (!) 54 (!) 55 (!) 54  Resp: 18 18 18 19   Temp:      TempSrc:      SpO2: 100% 100% 99% 99%   No intake or output data in the 24 hours ending 03/15/21 0947 There were no vitals filed for this visit. There is no height or weight on file to calculate BMI.  Exam:  General: 86 y.o. year-old female well developed well nourished in no acute distress.  Alert and oriented x3.  Obese no distress Cardiovascular: Regular rate and rhythm with no rubs or gallops.  No thyromegaly or JVD noted.   Respiratory: Clear to auscultation with no wheezes or rales. Good inspiratory effort. Abdomen: Soft nontender nondistended with normal bowel sounds x4 quadrants.  Obese++ Musculoskeletal: Trace lower extremity edema. 2/4 pulses in all 4 extremities. Skin: No ulcerative lesions noted or rashes, Psychiatry: Mood is appropriate for condition and setting    Data Reviewed: CBC: Recent Labs  Lab 03/13/21 1206 03/14/21 1605 03/15/21 0904  WBC 6.0 5.3 5.0  NEUTROABS 4.4 3.7  --   HGB 11.0* 10.9* 10.2*  HCT 34.8* 33.5* 32.5*  MCV 94.8 95.2 95.6  PLT 134* 136* 841*   Basic Metabolic Panel: Recent Labs  Lab 03/13/21 1206 03/14/21 1605  NA 137 142  K 4.3 4.2  CL 99 104  CO2 30  28  GLUCOSE 197* 106*  BUN 61* 66*  CREATININE 2.20* 2.27*  CALCIUM 10.1 9.1   GFR: Estimated Creatinine Clearance: 20.6 mL/min (A) (by C-G formula based on SCr of 2.27 mg/dL (H)). Liver Function Tests: Recent Labs  Lab 03/13/21 1206  AST 11*  ALT 11  ALKPHOS 68  BILITOT 0.6  PROT 7.1  ALBUMIN 4.3   No results for input(s): LIPASE, AMYLASE in the last 168 hours. No results for input(s): AMMONIA in the last 168 hours. Coagulation Profile: No results for input(s): INR, PROTIME in the last 168 hours. Cardiac Enzymes: No results for input(s): CKTOTAL, CKMB, CKMBINDEX, TROPONINI in the last 168 hours. BNP (last 3 results) Recent Labs    02/17/21 1418  PROBNP 70.0   HbA1C: No results for input(s): HGBA1C in the last 72 hours. CBG: Recent Labs  Lab 03/14/21 2319 03/15/21 0749  GLUCAP 155* 140*   Lipid Profile: No results for input(s): CHOL, HDL, LDLCALC, TRIG, CHOLHDL, LDLDIRECT in the last 72 hours. Thyroid Function Tests: No results for input(s): TSH, T4TOTAL, FREET4, T3FREE, THYROIDAB in the last 72 hours. Anemia Panel: Recent Labs    03/13/21 1205  FERRITIN 290  TIBC 316  IRON 72   Urine analysis:    Component Value Date/Time   COLORURINE YELLOW 01/08/2021 1407   APPEARANCEUR HAZY (A) 01/08/2021 1407   LABSPEC 1.021 01/08/2021 1407   PHURINE 5.0 01/08/2021 1407   GLUCOSEU >=500 (A) 01/08/2021 1407   HGBUR NEGATIVE 01/08/2021 1407   HGBUR negative 08/05/2010 1523   BILIRUBINUR NEGATIVE 01/08/2021 1407   BILIRUBINUR N 12/15/2016 1059   KETONESUR NEGATIVE 01/08/2021 1407   PROTEINUR >=300 (A) 01/08/2021 1407   UROBILINOGEN 0.2 12/15/2016 1059   UROBILINOGEN 0.2 08/05/2010 1523   NITRITE NEGATIVE 01/08/2021 1407   LEUKOCYTESUR NEGATIVE 01/08/2021 1407   Sepsis Labs: @LABRCNTIP (procalcitonin:4,lacticidven:4)  ) Recent Results (from the past 240 hour(s))  Resp Panel by RT-PCR (Flu A&B, Covid) Nasopharyngeal Swab     Status: None   Collection Time:  03/14/21  6:47 PM   Specimen: Nasopharyngeal Swab; Nasopharyngeal(NP) swabs in vial transport medium  Result Value Ref Range Status   SARS Coronavirus 2 by RT PCR NEGATIVE NEGATIVE Final    Comment: (NOTE) SARS-CoV-2 target nucleic acids are NOT DETECTED.  The SARS-CoV-2 RNA is generally detectable in upper respiratory specimens during the acute phase of infection. The lowest concentration of SARS-CoV-2 viral copies this assay can detect is 138 copies/mL. A negative result does not preclude SARS-Cov-2 infection and should not be used as the sole basis for treatment or other patient management decisions. A negative result may occur with  improper specimen collection/handling, submission of specimen other than nasopharyngeal swab, presence of viral mutation(s) within the areas targeted by this assay, and inadequate number of viral copies(<138 copies/mL). A negative result must be combined with clinical observations, patient history, and epidemiological information. The expected result is Negative.  Fact Sheet for Patients:  EntrepreneurPulse.com.au  Fact Sheet for Healthcare Providers:  IncredibleEmployment.be  This test is no t yet approved or cleared by the [place] FDA and  has  been authorized for detection and/or diagnosis of SARS-CoV-2 by FDA under an Emergency Use Authorization (EUA). This EUA will remain  in effect (meaning this test can be used) for the duration of the COVID-19 declaration under Section 564(b)(1) of the Act, 21 U.S.C.section 360bbb-3(b)(1), unless the authorization is terminated  or revoked sooner.       Influenza A by PCR NEGATIVE NEGATIVE Final   Influenza B by PCR NEGATIVE NEGATIVE Final    Comment: (NOTE) The Xpert Xpress SARS-CoV-2/FLU/RSV plus assay is intended as an aid in the diagnosis of influenza from Nasopharyngeal swab specimens and should not be used as a sole basis for treatment. Nasal washings  and aspirates are unacceptable for Xpert Xpress SARS-CoV-2/FLU/RSV testing.  Fact Sheet for Patients: EntrepreneurPulse.com.au  Fact Sheet for Healthcare Providers: IncredibleEmployment.be  This test is not yet approved or cleared by the [place] FDA and has been authorized for detection and/or diagnosis of SARS-CoV-2 by FDA under an Emergency Use Authorization (EUA). This EUA will remain in effect (meaning this test can be used) for the duration of the COVID-19 declaration under Section 564(b)(1) of the Act, 21 U.S.C. section 360bbb-3(b)(1), unless the authorization is terminated or revoked.  Performed at [facility], [place] [address], [place], [place] [address]       Studies: DG Chest 2 View  Result Date: 03/14/2021 CLINICAL DATA:  Shortness of breath EXAM: CHEST - 2 VIEW COMPARISON:  01/30/2021, CT 07/04/2019 FINDINGS: Cardiomegaly with small right greater than left bilateral effusions. Vascular congestion and mild diffuse ground-glass opacity likely pulmonary edema. Consolidation at right base. Aortic atherosclerosis. No pneumothorax. IMPRESSION: Cardiomegaly with small bilateral effusions, vascular congestion and mild pulmonary edema. Atelectasis or pneumonia at the right base. Electronically Signed   By: Donavan Foil M.D.   On: 03/14/2021 15:54    Scheduled Meds:  amLODipine  7.5 mg Oral Daily   atorvastatin  40 mg Oral QHS   furosemide  80 mg Intravenous Q12H   insulin aspart  0-15 Units Subcutaneous TID WC   insulin aspart  0-5 Units Subcutaneous QHS   insulin glargine  46 Units Subcutaneous QHS   levothyroxine  112 mcg Oral Q0600   metoprolol succinate  25 mg Oral Daily   potassium citrate  20 mEq Oral BID   sertraline  50 mg Oral QHS   vitamin B-12  100 mcg Oral Daily   Vitamin D3  2,000 Units Oral Daily    Continuous Infusions:   LOS: 1 day     Cristal Deer, MD [facility]  To reach me  or the doctor on call, go to: [email or web address] Password [email or web address]  03/15/2021, 9:47 AM

## 2021-03-15 NOTE — ED Notes (Signed)
Pt ambulated from bedside recliner to the bedside commode. Given a lunch tray.

## 2021-03-15 NOTE — Plan of Care (Signed)
  Problem: Education: Goal: Ability to demonstrate management of disease process will improve Outcome: Progressing Goal: Ability to verbalize understanding of medication therapies will improve Outcome: Progressing Goal: Individualized Educational Video(s) Outcome: Progressing   Problem: Activity: Goal: Capacity to carry out activities will improve Outcome: Progressing   Problem: Cardiac: Goal: Ability to achieve and maintain adequate cardiopulmonary perfusion will improve Outcome: Progressing   Problem: Education: Goal: Knowledge of General Education information will improve Description: Including pain rating scale, medication(s)/side effects and non-pharmacologic comfort measures Outcome: Progressing   Problem: Health Behavior/Discharge Planning: Goal: Ability to manage health-related needs will improve Outcome: Progressing   Problem: Clinical Measurements: Goal: Ability to maintain clinical measurements within normal limits will improve Outcome: Progressing   Problem: Clinical Measurements: Goal: Ability to maintain clinical measurements within normal limits will improve Outcome: Progressing Goal: Will remain free from infection Outcome: Progressing Goal: Diagnostic test results will improve Outcome: Progressing Goal: Respiratory complications will improve Outcome: Progressing Goal: Cardiovascular complication will be avoided Outcome: Progressing   Problem: Activity: Goal: Risk for activity intolerance will decrease Outcome: Progressing   Problem: Nutrition: Goal: Adequate nutrition will be maintained Outcome: Progressing   Problem: Coping: Goal: Level of anxiety will decrease Outcome: Progressing   Problem: Elimination: Goal: Will not experience complications related to bowel motility Outcome: Progressing Goal: Will not experience complications related to urinary retention Outcome: Progressing   Problem: Pain Managment: Goal: General experience of  comfort will improve Outcome: Progressing   Problem: Safety: Goal: Ability to remain free from injury will improve Outcome: Progressing   Problem: Skin Integrity: Goal: Risk for impaired skin integrity will decrease Outcome: Progressing

## 2021-03-16 DIAGNOSIS — J9601 Acute respiratory failure with hypoxia: Secondary | ICD-10-CM

## 2021-03-16 LAB — BASIC METABOLIC PANEL
Anion gap: 8 (ref 5–15)
BUN: 64 mg/dL — ABNORMAL HIGH (ref 8–23)
CO2: 34 mmol/L — ABNORMAL HIGH (ref 22–32)
Calcium: 9.2 mg/dL (ref 8.9–10.3)
Chloride: 98 mmol/L (ref 98–111)
Creatinine, Ser: 2.23 mg/dL — ABNORMAL HIGH (ref 0.44–1.00)
GFR, Estimated: 21 mL/min — ABNORMAL LOW (ref >60)
Glucose, Bld: 197 mg/dL — ABNORMAL HIGH (ref 70–99)
Potassium: 4.2 mmol/L (ref 3.5–5.1)
Sodium: 140 mmol/L (ref 135–145)

## 2021-03-16 LAB — GLUCOSE, CAPILLARY
Glucose-Capillary: 116 mg/dL — ABNORMAL HIGH (ref 70–99)
Glucose-Capillary: 189 mg/dL — ABNORMAL HIGH (ref 70–99)
Glucose-Capillary: 190 mg/dL — ABNORMAL HIGH (ref 70–99)
Glucose-Capillary: 199 mg/dL — ABNORMAL HIGH (ref 70–99)

## 2021-03-16 LAB — CBC WITH DIFFERENTIAL/PLATELET
Abs Immature Granulocytes: 0.02 10*3/uL (ref 0.00–0.07)
Basophils Absolute: 0 10*3/uL (ref 0.0–0.1)
Basophils Relative: 0 %
Eosinophils Absolute: 0.2 10*3/uL (ref 0.0–0.5)
Eosinophils Relative: 4 %
HCT: 32 % — ABNORMAL LOW (ref 36.0–46.0)
Hemoglobin: 10.2 g/dL — ABNORMAL LOW (ref 12.0–15.0)
Immature Granulocytes: 0 %
Lymphocytes Relative: 16 %
Lymphs Abs: 0.8 10*3/uL (ref 0.7–4.0)
MCH: 30.3 pg (ref 26.0–34.0)
MCHC: 31.9 g/dL (ref 30.0–36.0)
MCV: 95 fL (ref 80.0–100.0)
Monocytes Absolute: 0.4 10*3/uL (ref 0.1–1.0)
Monocytes Relative: 8 %
Neutro Abs: 3.6 10*3/uL (ref 1.7–7.7)
Neutrophils Relative %: 72 %
Platelets: 130 10*3/uL — ABNORMAL LOW (ref 150–400)
RBC: 3.37 MIL/uL — ABNORMAL LOW (ref 3.87–5.11)
RDW: 17 % — ABNORMAL HIGH (ref 11.5–15.5)
WBC: 5 10*3/uL (ref 4.0–10.5)
nRBC: 0 % (ref 0.0–0.2)

## 2021-03-16 MED ORDER — SODIUM CHLORIDE 0.9 % IV SOLN
1.0000 g | INTRAVENOUS | Status: DC
  Administered 2021-03-16 – 2021-03-17 (×2): 1 g via INTRAVENOUS
  Filled 2021-03-16 (×2): qty 1

## 2021-03-16 NOTE — Progress Notes (Signed)
Patient has had two different episodes in which her heart rate remained in the high 30s. Both in which patient was asymptomatic and returned to the high 50s. Night provider made aware via secure chat.

## 2021-03-16 NOTE — Progress Notes (Signed)
PROGRESS NOTE  [name] ZTI:[id number] DOB: 1935/07/14 DOA: 03/14/2021 PCP: Caren Macadam, MD  HPI/Recap of past 5 hours: 86 year old admitted for heart failure exacerbation. Patient seen and examined at bedside denies any chest pain.  March 16, 2021 Patient seen and examined at bedside she stated she is doing very much better done yesterday.  However she still desaturating to 88 on room air without oxygen.  Assessment/Plan: Active Problems:   Hypothyroidism   DM type 2 (diabetes mellitus, type 2) (HCC)   HYPERCHOLESTEROLEMIA   Anxiety state   Essential hypertension   Acute respiratory failure (HCC)   Chronic kidney disease (CKD) stage G4/A1, severely decreased glomerular filtration rate (GFR) between 15-29 mL/min/1.73 square meter and albuminuria creatinine ratio less than 30 mg/g (HCC)   Atypical atrial flutter (HCC)   Acute on chronic diastolic (congestive) heart failure ([place])  1.  Acute CHF exacerbation with acute respiratory failure and hypoxia  requiring 2 L/min of oxygen continue IV diuresis   2.  Hypothyroidism  continue levothyroxine  3.  Hypercholesterolemia  continue atorvastatin  4.  Diabetes mellitus  continue Lantus and sliding scale  5.  Urinary tract infection.   We will give a dose of ceftriaxone  6.  Chronic kidney disease stage IVa  we will avoid nephrotoxic drug gentle diuresis  7.  Hypertension uncontrolled continue metoprolol and amlodipine  8.  Atrial fibrillation  continue metoprolol  9.  Acute respiratory failure with hypoxia.  Requiring 2 L/min.  Patient still desaturates to 88% on room air.  So we will continue oxygen therapy  Code Status:  DNR  Severity of Illness: The appropriate patient status for this patient is INPATIENT. Inpatient status is judged to be reasonable and necessary in order to provide the required intensity of service to ensure the patient's safety. The patient's presenting symptoms, physical exam  findings, and initial radiographic and laboratory data in the context of their chronic comorbidities is felt to place them at high risk for further clinical deterioration. Furthermore, it is not anticipated that the patient will be medically stable for discharge from the hospital within 2 midnights of admission. The following factors support the patient status of inpatient.  .  Thing IV therapy that cannot be given orally * I certify that at the point of admission it is my clinical judgment that the patient will require inpatient hospital care spanning beyond 2 midnights from the point of admission due to high intensity of service, high risk for further deterioration and high frequency of surveillance required.*   Family Communication: Patient  Disposition Plan: Discharge when stable and safe for discharge at this time   Consultants: None  Procedures: None  Antimicrobials: None  DVT prophylaxis: Lovenox   Objective: Vitals:   03/16/21 0206 03/16/21 0500 03/16/21 0821 03/16/21 0826  BP: (!) 137/49   (!) 146/78  Pulse: (!) 56   63  Resp: 20     Temp: 97.6 F (36.4 C)     TempSrc: Oral     SpO2: 95%  96%   Weight:  99.2 kg    Height:        Intake/Output Summary (Last 24 hours) at 03/16/2021 1014 Last data filed at 03/16/2021 0945 Gross per 24 hour  Intake 240 ml  Output 200 ml  Net 40 ml   Filed Weights   03/15/21 1419 03/16/21 0500  Weight: 101.9 kg 99.2 kg   Body mass index is 41.32 kg/m.  Exam:  General: 86 y.o.  year-old female well developed well nourished in no acute distress.  Alert and oriented x3.  Obese no distress Cardiovascular: Regular rate and rhythm with no rubs or gallops.  No thyromegaly or JVD noted.   Respiratory: Clear to auscultation with no wheezes or rales. Good inspiratory effort. Abdomen: Soft nontender nondistended with normal bowel sounds x4 quadrants.  Obese++ Musculoskeletal: Trace lower extremity edema. 2/4 pulses in all 4  extremities. Skin: No ulcerative lesions noted or rashes, Psychiatry: Mood is appropriate for condition and setting    Data Reviewed: CBC: Recent Labs  Lab 03/13/21 1206 03/14/21 1605 03/15/21 0904  WBC 6.0 5.3 5.0  NEUTROABS 4.4 3.7  --   HGB 11.0* 10.9* 10.2*  HCT 34.8* 33.5* 32.5*  MCV 94.8 95.2 95.6  PLT 134* 136* 136*    Basic Metabolic Panel: Recent Labs  Lab 03/13/21 1206 03/14/21 1605 03/15/21 0904  NA 137 142 143  K 4.3 4.2 4.5  CL 99 104 104  CO2 30 28 31   GLUCOSE 197* 106* 155*  BUN 61* 66* 63*  CREATININE 2.20* 2.27* 2.20*  CALCIUM 10.1 9.1 9.2  MG  --   --  2.1  PHOS  --   --  5.2*    GFR: Estimated Creatinine Clearance: 20.9 mL/min (A) (by C-G formula based on SCr of 2.2 mg/dL (H)). Liver Function Tests: Recent Labs  Lab 03/13/21 1206 03/15/21 0904  AST 11* 16  ALT 11 15  ALKPHOS 68 63  BILITOT 0.6 0.8  PROT 7.1 7.0  ALBUMIN 4.3 3.8    No results for input(s): LIPASE, AMYLASE in the last 168 hours. No results for input(s): AMMONIA in the last 168 hours. Coagulation Profile: No results for input(s): INR, PROTIME in the last 168 hours. Cardiac Enzymes: No results for input(s): CKTOTAL, CKMB, CKMBINDEX, TROPONINI in the last 168 hours. BNP (last 3 results) Recent Labs    02/17/21 1418  PROBNP 70.0    HbA1C: No results for input(s): HGBA1C in the last 72 hours. CBG: Recent Labs  Lab 03/15/21 0749 03/15/21 1244 03/15/21 1647 03/15/21 2046 03/16/21 0717  GLUCAP 140* 172* 191* 186* 116*    Lipid Profile: No results for input(s): CHOL, HDL, LDLCALC, TRIG, CHOLHDL, LDLDIRECT in the last 72 hours. Thyroid Function Tests: No results for input(s): TSH, T4TOTAL, FREET4, T3FREE, THYROIDAB in the last 72 hours. Anemia Panel: Recent Labs    03/13/21 1205  FERRITIN 290  TIBC 316  IRON 72    Urine analysis:    Component Value Date/Time   COLORURINE YELLOW 03/15/2021 0932   APPEARANCEUR CLEAR 03/15/2021 0932   LABSPEC 1.006  03/15/2021 0932   PHURINE 5.0 03/15/2021 0932   GLUCOSEU NEGATIVE 03/15/2021 0932   HGBUR NEGATIVE 03/15/2021 0932   HGBUR negative 08/05/2010 1523   BILIRUBINUR NEGATIVE 03/15/2021 0932   BILIRUBINUR N 12/15/2016 1059   KETONESUR NEGATIVE 03/15/2021 0932   PROTEINUR 100 (A) 03/15/2021 0932   UROBILINOGEN 0.2 12/15/2016 1059   UROBILINOGEN 0.2 08/05/2010 1523   NITRITE NEGATIVE 03/15/2021 0932   LEUKOCYTESUR MODERATE (A) 03/15/2021 0932   Sepsis Labs: @LABRCNTIP (procalcitonin:4,lacticidven:4)  ) Recent Results (from the past 240 hour(s))  Resp Panel by RT-PCR (Flu A&B, Covid) Nasopharyngeal Swab     Status: None   Collection Time: 03/14/21  6:47 PM   Specimen: Nasopharyngeal Swab; Nasopharyngeal(NP) swabs in vial transport medium  Result Value Ref Range Status   SARS Coronavirus 2 by RT PCR NEGATIVE NEGATIVE Final    Comment: (NOTE) SARS-CoV-2 target nucleic  acids are NOT DETECTED.  The SARS-CoV-2 RNA is generally detectable in upper respiratory specimens during the acute phase of infection. The lowest concentration of SARS-CoV-2 viral copies this assay can detect is 138 copies/mL. A negative result does not preclude SARS-Cov-2 infection and should not be used as the sole basis for treatment or other patient management decisions. A negative result may occur with  improper specimen collection/handling, submission of specimen other than nasopharyngeal swab, presence of viral mutation(s) within the areas targeted by this assay, and inadequate number of viral copies(<138 copies/mL). A negative result must be combined with clinical observations, patient history, and epidemiological information. The expected result is Negative.  Fact Sheet for Patients:  EntrepreneurPulse.com.au  Fact Sheet for Healthcare Providers:  IncredibleEmployment.be  This test is no t yet approved or cleared by the [place] FDA and  has been authorized for  detection and/or diagnosis of SARS-CoV-2 by FDA under an Emergency Use Authorization (EUA). This EUA will remain  in effect (meaning this test can be used) for the duration of the COVID-19 declaration under Section 564(b)(1) of the Act, 21 U.S.C.section 360bbb-3(b)(1), unless the authorization is terminated  or revoked sooner.       Influenza A by PCR NEGATIVE NEGATIVE Final   Influenza B by PCR NEGATIVE NEGATIVE Final    Comment: (NOTE) The Xpert Xpress SARS-CoV-2/FLU/RSV plus assay is intended as an aid in the diagnosis of influenza from Nasopharyngeal swab specimens and should not be used as a sole basis for treatment. Nasal washings and aspirates are unacceptable for Xpert Xpress SARS-CoV-2/FLU/RSV testing.  Fact Sheet for Patients: EntrepreneurPulse.com.au  Fact Sheet for Healthcare Providers: IncredibleEmployment.be  This test is not yet approved or cleared by the [place] FDA and has been authorized for detection and/or diagnosis of SARS-CoV-2 by FDA under an Emergency Use Authorization (EUA). This EUA will remain in effect (meaning this test can be used) for the duration of the COVID-19 declaration under Section 564(b)(1) of the Act, 21 U.S.C. section 360bbb-3(b)(1), unless the authorization is terminated or revoked.  Performed at [facility], [place] [address], [place], [place] [address]       Studies: No results found.  Scheduled Meds:  amLODipine  7.5 mg Oral Daily   atorvastatin  40 mg Oral QHS   cholecalciferol  2,000 Units Oral Daily   furosemide  80 mg Intravenous Q12H   insulin aspart  0-15 Units Subcutaneous TID WC   insulin aspart  0-5 Units Subcutaneous QHS   insulin glargine  46 Units Subcutaneous QHS   levothyroxine  112 mcg Oral Q0600   metoprolol succinate  25 mg Oral Daily   potassium citrate  20 mEq Oral BID   sertraline  50 mg Oral QHS   vitamin B-12  100 mcg Oral Daily     Continuous Infusions:  cefTRIAXone (ROCEPHIN)  IV       LOS: 2 days     Cristal Deer, MD [facility]  To reach me or the doctor on call, go to: [email or web address] Password [email or web address]  03/16/2021, 10:14 AM

## 2021-03-16 NOTE — Progress Notes (Signed)
[name], [name] ([id number]) Visit Report for 03/11/2021 Chief Complaint Document Details Patient Name: Date of Service: LIND, AUSLEY NICE J. 03/11/2021 10:30 A M Medical Record Number: [id number] Patient Account Number: [id number] Date of Birth/Sex: Treating RN: September 22, 1934 (86 y.o. Female) Deon Pilling Primary Care Provider: Micheline Rough Other Clinician: Referring Provider: Treating Provider/Extender: Ladonna Snide Weeks in Treatment: 0 Information Obtained from: Patient Chief Complaint Bilateral lower extremity swelling Electronic Signature(s) Signed: 03/16/2021 8:35:20 PM By: Kalman Shan DO Entered By: Kalman Shan on 03/16/2021 20:17:21 -------------------------------------------------------------------------------- HPI Details Patient Name: Date of Service: Sara Antu NICE J. 03/11/2021 10:30 A M Medical Record Number: [id number] Patient Account Number: [id number] Date of Birth/Sex: Treating RN: 30-Jun-1935 (86 y.o. Female) Deon Pilling Primary Care Provider: Micheline Rough Other Clinician: Referring Provider: Treating Provider/Extender: Ladonna Snide Weeks in Treatment: 0 History of Present Illness HPI Description: [name] is an 86 year old female with a past medical history of diastolic congestive heart failure, type II diabetes mellitus and chronic venous insufficiency that presents to the clinic for further assessment of her lower extremity edema bilaterally. She reports waxing and waning of her lower extremity edema for several years thus resulting in open wounds. They usually heal on their own. She currently does not have open wounds. She has compression stockings at home however has a difficult time putting these on. Electronic Signature(s) Signed: 03/16/2021 8:35:20 PM By: Kalman Shan DO Entered By: Kalman Shan on 03/16/2021  20:24:42 -------------------------------------------------------------------------------- Physical Exam Details Patient Name: Date of Service: Sara Antu NICE J. 03/11/2021 10:30 A M Medical Record Number: [id number] Patient Account Number: [id number] Date of Birth/Sex: Treating RN: [birth year] (86 y.o. Female) Deon Pilling Primary Care Provider: Micheline Rough Other Clinician: Referring Provider: Treating Provider/Extender: Ladonna Snide Weeks in Treatment: 0 Constitutional respirations regular, non-labored and within target range for patient.. Cardiovascular 2+ dorsalis pedis/posterior tibialis pulses. Psychiatric pleasant and cooperative. Notes Venous stasis dermatitis bilaterally to lower extremities. No open wounds. 2+ pitting edema bilaterally to lower extremities to the knee Electronic Signature(s) Signed: 03/16/2021 8:35:20 PM By: Kalman Shan DO Entered By: Kalman Shan on 03/16/2021 20:25:51 -------------------------------------------------------------------------------- Physician Orders Details Patient Name: Date of Service: Sara Antu NICE J. 03/11/2021 10:30 A M Medical Record Number: [id number] Patient Account Number: [id number] Date of Birth/Sex: Treating RN: 1935/04/22 (86 y.o. Female) Baruch Gouty Primary Care Provider: Micheline Rough Other Clinician: Referring Provider: Treating Provider/Extender: Ladonna Snide Weeks in Treatment: 0 Verbal / Phone Orders: No Diagnosis Coding ICD-10 Coding Code Description I87.2 Venous insufficiency (chronic) (peripheral) W54.62 Chronic diastolic (congestive) heart failure E11.9 Type 2 diabetes mellitus without complications Discharge From [facility] Services Discharge from [place] Edema Control - Lymphedema / SCD / Other Bilateral Lower Extremities Elevate legs to the level of the heart or above for 30 minutes daily and/or when sitting, a frequency of: - throughout  the day Avoid standing for long periods of time. Exercise regularly Moisturize legs daily. Compression stocking or Garment 20-30 mm/Hg pressure to: - stockings or juxtalites daily Non Wound Condition Other Non Wound Condition Orders/Instructions: - may use xeroform on any open areas if needed Electronic Signature(s) Signed: 03/16/2021 8:35:20 PM By: Kalman Shan DO Previous Signature: 03/11/2021 7:10:16 PM Version By: Baruch Gouty RN, BSN Entered By: Kalman Shan on 03/16/2021 20:26:44 -------------------------------------------------------------------------------- Problem List Details Patient Name: Date of Service: Sara Antu NICE J. 03/11/2021 10:30 A M Medical Record Number: [id number] Patient Account Number: [id number] Date of Birth/Sex: Treating RN: [birth year] (86 y.o. Female)  Deon Pilling Primary Care Provider: Micheline Rough Other Clinician: Referring Provider: Treating Provider/Extender: Ladonna Snide Weeks in Treatment: 0 Active Problems ICD-10 Encounter Code Description Active Date MDM Diagnosis I87.2 Venous insufficiency (chronic) (peripheral) 03/11/2021 No Yes I71.24 Chronic diastolic (congestive) heart failure 03/16/2021 No Yes E11.9 Type 2 diabetes mellitus without complications 5/80/9983 No Yes Inactive Problems Resolved Problems Electronic Signature(s) Signed: 03/16/2021 8:35:20 PM By: Kalman Shan DO Entered By: Kalman Shan on 03/16/2021 20:16:42 -------------------------------------------------------------------------------- Progress Note Details Patient Name: Date of Service: Sara Antu NICE J. 03/11/2021 10:30 A M Medical Record Number: [id number] Patient Account Number: [id number] Date of Birth/Sex: Treating RN: 1935-08-15 (86 y.o. Female) Deon Pilling Primary Care Provider: Micheline Rough Other Clinician: Referring Provider: Treating Provider/Extender: Ladonna Snide Weeks in Treatment:  0 Subjective Chief Complaint Information obtained from Patient Bilateral lower extremity swelling History of Present Illness (HPI) [name] is an 86 year old female with a past medical history of diastolic congestive heart failure, type II diabetes mellitus and chronic venous insufficiency that presents to the clinic for further assessment of her lower extremity edema bilaterally. She reports waxing and waning of her lower extremity edema for several years thus resulting in open wounds. They usually heal on their own. She currently does not have open wounds. She has compression stockings at home however has a difficult time putting these on. Patient History Information obtained from Patient. Allergies adhesive tape, apixaban, aspirin (Reaction: rash, hives), mirabegron, pineapple (Reaction: anaphylaxis), metformin (Reaction: diarrhea), tetracycline (Reaction: hives), fluticasone (Reaction: rash), Iodinated Contrast Media (Reaction: rash), lactose (Reaction: intolerance), latex (Reaction: rash), lisinopril (Reaction: cough), metronidazole, Sulfa (Sulfonamide Antibiotics) (Reaction: rash) Family History Diabetes - Paternal Grandparents,Child, Heart Disease - Mother, Hypertension - Mother,Father, Kidney Disease - Mother, Stroke - Mother, No family history of Cancer, Hereditary Spherocytosis, Lung Disease, Seizures, Thyroid Problems, Tuberculosis. Social History Former smoker - quit 30 yrs ago, Marital Status - Widowed, Alcohol Use - Never, Drug Use - No History, Caffeine Use - Daily - coffee. Medical History Eyes Patient has history of Cataracts - bil removed Denies history of Glaucoma, Optic Neuritis Ear/Nose/Mouth/Throat Denies history of Chronic sinus problems/congestion, Middle ear problems Hematologic/Lymphatic Patient has history of Anemia - normocytic, Lymphedema Denies history of Hemophilia, Human Immunodeficiency Virus, Sickle Cell Disease Respiratory Patient has history of  Asthma - childhood, Sleep Apnea Denies history of Tuberculosis Cardiovascular Patient has history of Arrhythmia - atrial flutter, afib, Congestive Heart Failure, Coronary Artery Disease, Hypertension Endocrine Patient has history of Type II Diabetes Denies history of Type I Diabetes Integumentary (Skin) Denies history of History of Burn Musculoskeletal Patient has history of Gout, Osteoarthritis Denies history of Rheumatoid Arthritis, Osteomyelitis Neurologic Patient has history of Neuropathy Oncologic Denies history of Received Chemotherapy, Received Radiation Psychiatric Patient has history of Confinement Anxiety Denies history of Anorexia/bulimia Patient is treated with Insulin. Blood sugar is tested. Hospitalization/Surgery History - left knee replacement. - bil cataract surgery. - thyroid surgery. - kidney stone surgery. - cardioversion. Medical A Surgical History Notes nd Constitutional Symptoms (General Health) obesity Ear/Nose/Mouth/Throat salivary stones Hematologic/Lymphatic chronic ITP Cardiovascular hypercholesteremia, hx PE Gastrointestinal hx hemorrhage of anus and rectum Endocrine hypothyroidism Genitourinary CKD stage 1, hx kidney stones Musculoskeletal hx right malleolus, scoliosis Review of Systems (ROS) Constitutional Symptoms (General Health) Denies complaints or symptoms of Fatigue, Fever, Chills, Marked Weight Change. Eyes Complains or has symptoms of Glasses / Contacts - reading. Denies complaints or symptoms of Vision Changes. Ear/Nose/Mouth/Throat Denies complaints or symptoms of Chronic sinus problems or rhinitis.  Respiratory Complains or has symptoms of Shortness of Breath. Denies complaints or symptoms of Chronic or frequent coughs. Cardiovascular Denies complaints or symptoms of Chest pain. Gastrointestinal Denies complaints or symptoms of Frequent diarrhea, Nausea, Vomiting. Endocrine Denies complaints or symptoms of Heat/cold  intolerance. Genitourinary Denies complaints or symptoms of Frequent urination. Integumentary (Skin) Denies complaints or symptoms of Wounds. Musculoskeletal Complains or has symptoms of Muscle Pain - myalgia, Muscle Weakness. Neurologic Complains or has symptoms of Numbness/parasthesias. Psychiatric Complains or has symptoms of Claustrophobia. Denies complaints or symptoms of Suicidal. Objective Constitutional respirations regular, non-labored and within target range for patient.. Vitals Time Taken: 11:43 AM, Height: 61 in, Source: Stated, Weight: 222 lbs, Source: Stated, BMI: 41.9, Temperature: 97.8 F, Pulse: 72 bpm, Respiratory Rate: 18 breaths/min, Blood Pressure: 148/76 mmHg. General Notes: has libre system to monitor blood glucose Cardiovascular 2+ dorsalis pedis/posterior tibialis pulses. Psychiatric pleasant and cooperative. General Notes: Venous stasis dermatitis bilaterally to lower extremities. No open wounds. 2+ pitting edema bilaterally to lower extremities to the knee Assessment Active Problems ICD-10 Venous insufficiency (chronic) (peripheral) Chronic diastolic (congestive) heart failure Type 2 diabetes mellitus without complications Patient has evidence of chronic venous insufficiency on exam. She has no open wounds. She has a history of open wounds that close with time. Her heart failure and chronic venous insufficiency are likely the culprit. I recommended compression stockings but the patient has a hard time putting these on. I recommended juxtalite compressions and these are velcro wraps that are much easier to manage. She would like for [place] to order these. Plan Discharge From [facility] Services: Discharge from [place] Edema Control - Lymphedema / SCD / Other: Elevate legs to the level of the heart or above for 30 minutes daily and/or when sitting, a frequency of: - throughout the day Avoid standing for long periods of time. Exercise  regularly Moisturize legs daily. Compression stocking or Garment 20-30 mm/Hg pressure to: - stockings or juxtalites daily Non Wound Condition: Other Non Wound Condition Orders/Instructions: - may use xeroform on any open areas if needed 1. Order juxtalite compression wraps 2. Follow up as needed Electronic Signature(s) Signed: 03/16/2021 8:35:20 PM By: Kalman Shan DO Entered By: Kalman Shan on 03/16/2021 20:33:30 -------------------------------------------------------------------------------- HxROS Details Patient Name: Date of Service: Sara Antu NICE J. 03/11/2021 10:30 A M Medical Record Number: [id number] Patient Account Number: [id number] Date of Birth/Sex: Treating RN: 10/16/1934 (86 y.o. Female) Baruch Gouty Primary Care Provider: Micheline Rough Other Clinician: Referring Provider: Treating Provider/Extender: Ladonna Snide Weeks in Treatment: 0 Information Obtained From Patient Constitutional Symptoms (General Health) Complaints and Symptoms: Negative for: Fatigue; Fever; Chills; Marked Weight Change Medical History: Past Medical History Notes: obesity Eyes Complaints and Symptoms: Positive for: Glasses / Contacts - reading Negative for: Vision Changes Medical History: Positive for: Cataracts - bil removed Negative for: Glaucoma; Optic Neuritis Ear/Nose/Mouth/Throat Complaints and Symptoms: Negative for: Chronic sinus problems or rhinitis Medical History: Negative for: Chronic sinus problems/congestion; Middle ear problems Past Medical History Notes: salivary stones Respiratory Complaints and Symptoms: Positive for: Shortness of Breath Negative for: Chronic or frequent coughs Medical History: Positive for: Asthma - childhood; Sleep Apnea Negative for: Tuberculosis Cardiovascular Complaints and Symptoms: Negative for: Chest pain Medical History: Positive for: Arrhythmia - atrial flutter, afib; Congestive Heart Failure;  Coronary Artery Disease; Hypertension Past Medical History Notes: hypercholesteremia, hx PE Gastrointestinal Complaints and Symptoms: Negative for: Frequent diarrhea; Nausea; Vomiting Medical History: Past Medical History Notes: hx hemorrhage of anus and rectum Endocrine Complaints and  Symptoms: Negative for: Heat/cold intolerance Medical History: Positive for: Type II Diabetes Negative for: Type I Diabetes Past Medical History Notes: hypothyroidism Time with diabetes: since 1989 Treated with: Insulin Blood sugar tested every day: Yes Tested : continuous libre monitor Genitourinary Complaints and Symptoms: Negative for: Frequent urination Medical History: Past Medical History Notes: CKD stage 1, hx kidney stones Integumentary (Skin) Complaints and Symptoms: Negative for: Wounds Medical History: Negative for: History of Burn Musculoskeletal Complaints and Symptoms: Positive for: Muscle Pain - myalgia; Muscle Weakness Medical History: Positive for: Gout; Osteoarthritis Negative for: Rheumatoid Arthritis; Osteomyelitis Past Medical History Notes: hx right malleolus, scoliosis Neurologic Complaints and Symptoms: Positive for: Numbness/parasthesias Medical History: Positive for: Neuropathy Psychiatric Complaints and Symptoms: Positive for: Claustrophobia Negative for: Suicidal Medical History: Positive for: Confinement Anxiety Negative for: Anorexia/bulimia Hematologic/Lymphatic Medical History: Positive for: Anemia - normocytic; Lymphedema Negative for: Hemophilia; Human Immunodeficiency Virus; Sickle Cell Disease Past Medical History Notes: chronic ITP Immunological Oncologic Medical History: Negative for: Received Chemotherapy; Received Radiation HBO Extended History Items Eyes: Cataracts Immunizations Pneumococcal Vaccine: Received Pneumococcal Vaccination: Yes Implantable Devices No devices added Hospitalization / Surgery History Type of  Hospitalization/Surgery left knee replacement bil cataract surgery thyroid surgery kidney stone surgery cardioversion Family and Social History Cancer: No; Diabetes: Yes - Paternal Grandparents,Child; Heart Disease: Yes - Mother; Hereditary Spherocytosis: No; Hypertension: Yes - Mother,Father; Kidney Disease: Yes - Mother; Lung Disease: No; Seizures: No; Stroke: Yes - Mother; Thyroid Problems: No; Tuberculosis: No; Former smoker - quit 30 yrs ago; Marital Status - Widowed; Alcohol Use: Never; Drug Use: No History; Caffeine Use: Daily - coffee; Financial Concerns: No; Food, Clothing or Shelter Needs: No; Support System Lacking: No; Transportation Concerns: No Electronic Signature(s) Signed: 03/11/2021 7:10:16 PM By: Baruch Gouty RN, BSN Signed: 03/16/2021 8:35:20 PM By: Kalman Shan DO Entered By: Baruch Gouty on 03/11/2021 12:00:57 -------------------------------------------------------------------------------- SuperBill Details Patient Name: Date of Service: Sara Antu NICE J. 03/11/2021 Medical Record Number: [id number] Patient Account Number: [id number] Date of Birth/Sex: Treating RN: 05-12-1935 (86 y.o. Female) Baruch Gouty Primary Care Provider: Micheline Rough Other Clinician: Referring Provider: Treating Provider/Extender: Ladonna Snide Weeks in Treatment: 0 Diagnosis Coding ICD-10 Codes Code Description I87.2 Venous insufficiency (chronic) (peripheral) H08.65 Chronic diastolic (congestive) heart failure E11.9 Type 2 diabetes mellitus without complications Facility Procedures CPT4 Code: [id number] Description: 99213 - WOUND CARE VISIT-LEV 3 EST PT Modifier: Quantity: 1 Physician Procedures : CPT4 Code Description Modifier [id number] WC PHYS LEVEL 3 NEW PT ICD-10 Diagnosis Description I87.2 Venous insufficiency (chronic) (peripheral) G40.10 Chronic diastolic (congestive) heart failure E11.9 Type 2 diabetes mellitus without  complications Quantity: 1 Electronic Signature(s) Signed: 03/16/2021 8:35:20 PM By: Kalman Shan DO Previous Signature: 03/11/2021 7:10:16 PM Version By: Baruch Gouty RN, BSN Entered By: Kalman Shan on 03/16/2021 20:34:39

## 2021-03-17 ENCOUNTER — Encounter (HOSPITAL_COMMUNITY): Payer: Self-pay | Admitting: Internal Medicine

## 2021-03-17 DIAGNOSIS — I5033 Acute on chronic diastolic (congestive) heart failure: Secondary | ICD-10-CM

## 2021-03-17 LAB — BASIC METABOLIC PANEL
Anion gap: 9 (ref 5–15)
BUN: 76 mg/dL — ABNORMAL HIGH (ref 8–23)
CO2: 35 mmol/L — ABNORMAL HIGH (ref 22–32)
Calcium: 9.6 mg/dL (ref 8.9–10.3)
Chloride: 97 mmol/L — ABNORMAL LOW (ref 98–111)
Creatinine, Ser: 2.25 mg/dL — ABNORMAL HIGH (ref 0.44–1.00)
GFR, Estimated: 21 mL/min — ABNORMAL LOW (ref >60)
Glucose, Bld: 163 mg/dL — ABNORMAL HIGH (ref 70–99)
Potassium: 4.2 mmol/L (ref 3.5–5.1)
Sodium: 141 mmol/L (ref 135–145)

## 2021-03-17 LAB — CBC WITH DIFFERENTIAL/PLATELET
Abs Immature Granulocytes: 0.01 10*3/uL (ref 0.00–0.07)
Basophils Absolute: 0 10*3/uL (ref 0.0–0.1)
Basophils Relative: 0 %
Eosinophils Absolute: 0.2 10*3/uL (ref 0.0–0.5)
Eosinophils Relative: 4 %
HCT: 32.6 % — ABNORMAL LOW (ref 36.0–46.0)
Hemoglobin: 10.5 g/dL — ABNORMAL LOW (ref 12.0–15.0)
Immature Granulocytes: 0 %
Lymphocytes Relative: 15 %
Lymphs Abs: 0.8 10*3/uL (ref 0.7–4.0)
MCH: 30.2 pg (ref 26.0–34.0)
MCHC: 32.2 g/dL (ref 30.0–36.0)
MCV: 93.7 fL (ref 80.0–100.0)
Monocytes Absolute: 0.4 10*3/uL (ref 0.1–1.0)
Monocytes Relative: 8 %
Neutro Abs: 3.7 10*3/uL (ref 1.7–7.7)
Neutrophils Relative %: 73 %
Platelets: 135 10*3/uL — ABNORMAL LOW (ref 150–400)
RBC: 3.48 MIL/uL — ABNORMAL LOW (ref 3.87–5.11)
RDW: 17.1 % — ABNORMAL HIGH (ref 11.5–15.5)
WBC: 5.2 10*3/uL (ref 4.0–10.5)
nRBC: 0 % (ref 0.0–0.2)

## 2021-03-17 LAB — GLUCOSE, CAPILLARY
Glucose-Capillary: 146 mg/dL — ABNORMAL HIGH (ref 70–99)
Glucose-Capillary: 192 mg/dL — ABNORMAL HIGH (ref 70–99)
Glucose-Capillary: 184 mg/dL — ABNORMAL HIGH (ref 70–99)
Glucose-Capillary: 262 mg/dL — ABNORMAL HIGH (ref 70–99)

## 2021-03-17 NOTE — Progress Notes (Addendum)
Progress Note    [name]  RJJ:[id number] DOB: 22-May-1935  DOA: 03/14/2021 PCP: Caren Macadam, MD      Brief Narrative:    Medical records reviewed and are as summarized below:  [name] is a 86 y.o. female with past medical history significant for paroxysmal atrial fibrillation and atrial flutter off of Eliquis for several years because of thrombocytopenia and history of GI bleed of unclear etiology, pulmonary embolism, chronic diastolic CHF, CKD stage IV, hypertension, who presented to the hospital because of shortness of breath.  She initially went to the A. fib clinic but she was referred to the emergency room for management of exacerbation of CHF.  She was admitted to the hospital for acute exacerbation of chronic diastolic CHF and acute hypoxic respiratory failure.  She was treated with IV Lasix.    Assessment/Plan:   Principal Problem:   Acute on chronic diastolic (congestive) heart failure (HCC) Active Problems:   Hypothyroidism   DM type 2 (diabetes mellitus, type 2) (HCC)   HYPERCHOLESTEROLEMIA   Anxiety state   Essential hypertension   Acute respiratory failure (HCC)   Chronic kidney disease (CKD) stage G4/A1, severely decreased glomerular filtration rate (GFR) between 15-29 mL/min/1.73 square meter and albuminuria creatinine ratio less than 30 mg/g (HCC)   Atypical atrial flutter (HCC)    Body mass index is 41.32 kg/m.  (Morbid obesity)  Acute exacerbation of chronic diastolic CHF: Continue IV Lasix.  Monitor BMP, daily weight and urine output.  2D echo in May 2022 showed EF estimated at 60 to 65%, moderate concentric LVH but LV diastolic function could not be evaluated.  Acute hypoxic respiratory failure: Taper off oxygen as able.  Check pulse oximetry with ambulation to determine need for home oxygen.  CKD stage IV: Creatinine stable  Paroxysmal atrial fibrillation/atrial flutter: Continue metoprolol.  She is not on  anticoagulation because of history of thrombocytopenia and GI bleed of unclear etiology.  Abnormal urinalysis: Urine culture was not ordered on admission.  She is asymptomatic.  She has had 2 doses of IV Rocephin.  Discontinue antibiotics.  Possible discharge home tomorrow.   Diet Order             Diet Heart Room service appropriate? Yes; Fluid consistency: Thin; Fluid restriction: 2000 mL Fluid  Diet effective now                      Consultants: None  Procedures: None    Medications:    amLODipine  7.5 mg Oral Daily   atorvastatin  40 mg Oral QHS   cholecalciferol  2,000 Units Oral Daily   furosemide  80 mg Intravenous Q12H   insulin aspart  0-15 Units Subcutaneous TID WC   insulin aspart  0-5 Units Subcutaneous QHS   insulin glargine  46 Units Subcutaneous QHS   levothyroxine  112 mcg Oral Q0600   metoprolol succinate  25 mg Oral Daily   potassium citrate  20 mEq Oral BID   sertraline  50 mg Oral QHS   vitamin B-12  100 mcg Oral Daily   Continuous Infusions:  cefTRIAXone (ROCEPHIN)  IV 1 g (03/16/21 1124)     Anti-infectives (From admission, onward)    Start     Dose/Rate Route Frequency Ordered Stop   03/16/21 1100  cefTRIAXone (ROCEPHIN) 1 g in sodium chloride 0.9 % 100 mL IVPB        1 g 200 mL/hr over  30 Minutes Intravenous Every 24 hours 03/16/21 1011                Family Communication/Anticipated D/C date and plan/Code Status   DVT prophylaxis: SCDs Start: 03/14/21 1942     Code Status: DNR  Family Communication: None Disposition Plan:    Status is: Inpatient  Remains inpatient appropriate because:Inpatient level of care appropriate due to severity of illness  Dispo: The patient is from: Home              Anticipated d/c is to: Home              Patient currently is not medically stable to d/c.   Difficult to place patient No           Subjective:   Interval events noted.  Breathing is a little better.  No  chest pain.  She has some swelling in her legs.  Objective:    Vitals:   03/16/21 1406 03/16/21 2058 03/17/21 0457 03/17/21 0500  BP: (!) 168/59 (!) 149/50 (!) 136/52   Pulse: (!) 57 67 (!) 55   Resp: 14  20   Temp: (!) 97.5 F (36.4 C) 98.4 F (36.9 C) 97.7 F (36.5 C)   TempSrc: Oral Oral    SpO2: 100% 98% 99%   Weight:    99.2 kg  Height:       No data found.   Intake/Output Summary (Last 24 hours) at 03/17/2021 1204 Last data filed at 03/17/2021 1000 Gross per 24 hour  Intake 610 ml  Output 1600 ml  Net -990 ml   Filed Weights   03/15/21 1419 03/16/21 0500 03/17/21 0500  Weight: 101.9 kg 99.2 kg 99.2 kg    Exam:  GEN: NAD SKIN: Warm and dry EYES: EOMI ENT: MMM CV: RRR PULM: CTA B ABD: soft, obese, NT, +BS CNS: AAO x 3, non focal EXT: B/l leg edema, multiple nodules on distal legs, chronic erythematous changes on the bilateral distal legs, no tenderness      Data Reviewed:   I have personally reviewed following labs and imaging studies:  Labs: Labs show the following:   Basic Metabolic Panel: Recent Labs  Lab 03/13/21 1206 03/14/21 1605 03/15/21 0904 03/16/21 1041 03/17/21 0835  NA 137 142 143 140 141  K 4.3 4.2 4.5 4.2 4.2  CL 99 104 104 98 97*  CO2 30 28 31  34* 35*  GLUCOSE 197* 106* 155* 197* 163*  BUN 61* 66* 63* 64* 76*  CREATININE 2.20* 2.27* 2.20* 2.23* 2.25*  CALCIUM 10.1 9.1 9.2 9.2 9.6  MG  --   --  2.1  --   --   PHOS  --   --  5.2*  --   --    GFR Estimated Creatinine Clearance: 20.5 mL/min (A) (by C-G formula based on SCr of 2.25 mg/dL (H)). Liver Function Tests: Recent Labs  Lab 03/13/21 1206 03/15/21 0904  AST 11* 16  ALT 11 15  ALKPHOS 68 63  BILITOT 0.6 0.8  PROT 7.1 7.0  ALBUMIN 4.3 3.8   No results for input(s): LIPASE, AMYLASE in the last 168 hours. No results for input(s): AMMONIA in the last 168 hours. Coagulation profile No results for input(s): INR, PROTIME in the last 168 hours.  CBC: Recent Labs   Lab 03/13/21 1206 03/14/21 1605 03/15/21 0904 03/16/21 1041 03/17/21 0835  WBC 6.0 5.3 5.0 5.0 5.2  NEUTROABS 4.4 3.7  --  3.6 3.7  HGB  11.0* 10.9* 10.2* 10.2* 10.5*  HCT 34.8* 33.5* 32.5* 32.0* 32.6*  MCV 94.8 95.2 95.6 95.0 93.7  PLT 134* 136* 136* 130* 135*   Cardiac Enzymes: No results for input(s): CKTOTAL, CKMB, CKMBINDEX, TROPONINI in the last 168 hours. BNP (last 3 results) Recent Labs    02/17/21 1418  PROBNP 70.0   CBG: Recent Labs  Lab 03/16/21 1223 03/16/21 1711 03/16/21 2053 03/17/21 0723 03/17/21 1149  GLUCAP 189* 190* 199* 146* 192*   D-Dimer: No results for input(s): DDIMER in the last 72 hours. Hgb A1c: No results for input(s): HGBA1C in the last 72 hours. Lipid Profile: No results for input(s): CHOL, HDL, LDLCALC, TRIG, CHOLHDL, LDLDIRECT in the last 72 hours. Thyroid function studies: No results for input(s): TSH, T4TOTAL, T3FREE, THYROIDAB in the last 72 hours.  Invalid input(s): FREET3 Anemia work up: No results for input(s): VITAMINB12, FOLATE, FERRITIN, TIBC, IRON, RETICCTPCT in the last 72 hours. Sepsis Labs: Recent Labs  Lab 03/14/21 1605 03/15/21 0904 03/16/21 1041 03/17/21 0835  WBC 5.3 5.0 5.0 5.2    Microbiology Recent Results (from the past 240 hour(s))  Resp Panel by RT-PCR (Flu A&B, Covid) Nasopharyngeal Swab     Status: None   Collection Time: 03/14/21  6:47 PM   Specimen: Nasopharyngeal Swab; Nasopharyngeal(NP) swabs in vial transport medium  Result Value Ref Range Status   SARS Coronavirus 2 by RT PCR NEGATIVE NEGATIVE Final    Comment: (NOTE) SARS-CoV-2 target nucleic acids are NOT DETECTED.  The SARS-CoV-2 RNA is generally detectable in upper respiratory specimens during the acute phase of infection. The lowest concentration of SARS-CoV-2 viral copies this assay can detect is 138 copies/mL. A negative result does not preclude SARS-Cov-2 infection and should not be used as the sole basis for treatment or other  patient management decisions. A negative result may occur with  improper specimen collection/handling, submission of specimen other than nasopharyngeal swab, presence of viral mutation(s) within the areas targeted by this assay, and inadequate number of viral copies(<138 copies/mL). A negative result must be combined with clinical observations, patient history, and epidemiological information. The expected result is Negative.  Fact Sheet for Patients:  EntrepreneurPulse.com.au  Fact Sheet for Healthcare Providers:  IncredibleEmployment.be  This test is no t yet approved or cleared by the [place] FDA and  has been authorized for detection and/or diagnosis of SARS-CoV-2 by FDA under an Emergency Use Authorization (EUA). This EUA will remain  in effect (meaning this test can be used) for the duration of the COVID-19 declaration under Section 564(b)(1) of the Act, 21 U.S.C.section 360bbb-3(b)(1), unless the authorization is terminated  or revoked sooner.       Influenza A by PCR NEGATIVE NEGATIVE Final   Influenza B by PCR NEGATIVE NEGATIVE Final    Comment: (NOTE) The Xpert Xpress SARS-CoV-2/FLU/RSV plus assay is intended as an aid in the diagnosis of influenza from Nasopharyngeal swab specimens and should not be used as a sole basis for treatment. Nasal washings and aspirates are unacceptable for Xpert Xpress SARS-CoV-2/FLU/RSV testing.  Fact Sheet for Patients: EntrepreneurPulse.com.au  Fact Sheet for Healthcare Providers: IncredibleEmployment.be  This test is not yet approved or cleared by the [place] FDA and has been authorized for detection and/or diagnosis of SARS-CoV-2 by FDA under an Emergency Use Authorization (EUA). This EUA will remain in effect (meaning this test can be used) for the duration of the COVID-19 declaration under Section 564(b)(1) of the Act, 21 U.S.C. section  360bbb-3(b)(1), unless the authorization  is terminated or revoked.  Performed at [facility], [place] [address], [place], [place] [address]     Procedures and diagnostic studies:  No results found.             LOS: 3 days   Jewell Ryans  [facility]   Pager on [email or web address]. If 7PM-7AM, please contact night-coverage at [email or web address]     03/17/2021, 12:04 PM

## 2021-03-17 NOTE — Care Management Important Message (Signed)
Important Message  Patient Details IM Letter given to the Patient. Name: [name] MRN: [id number] Date of Birth: 07-15-1935   Medicare Important Message Given:  Yes     Kerin Salen 03/17/2021, 10:43 AM

## 2021-03-17 NOTE — Plan of Care (Signed)

## 2021-03-17 NOTE — Progress Notes (Signed)
SATURATION QUALIFICATIONS: (This note is used to comply with regulatory documentation for home oxygen)  Patient Saturations on Room Air at Rest = 94%  Patient Saturations on Room Air while Ambulating = 84%  Patient Saturations on 2 Liters of oxygen while Ambulating = 90%  Please briefly explain why patient needs home oxygen: Pt desats when ambulating and with exertion

## 2021-03-18 ENCOUNTER — Ambulatory Visit: Payer: Medicare Other | Admitting: Pulmonary Disease

## 2021-03-18 ENCOUNTER — Other Ambulatory Visit: Payer: Self-pay

## 2021-03-18 ENCOUNTER — Ambulatory Visit (INDEPENDENT_AMBULATORY_CARE_PROVIDER_SITE_OTHER): Payer: Medicare Other | Admitting: Pulmonary Disease

## 2021-03-18 ENCOUNTER — Encounter: Payer: Self-pay | Admitting: Pulmonary Disease

## 2021-03-18 VITALS — BP 122/60 | HR 63 | Ht 61.0 in | Wt 224.0 lb

## 2021-03-18 DIAGNOSIS — R0600 Dyspnea, unspecified: Secondary | ICD-10-CM

## 2021-03-18 DIAGNOSIS — J9621 Acute and chronic respiratory failure with hypoxia: Secondary | ICD-10-CM | POA: Diagnosis not present

## 2021-03-18 DIAGNOSIS — G4733 Obstructive sleep apnea (adult) (pediatric): Secondary | ICD-10-CM

## 2021-03-18 LAB — GLUCOSE, CAPILLARY
Glucose-Capillary: 141 mg/dL — ABNORMAL HIGH (ref 70–99)
Glucose-Capillary: 169 mg/dL — ABNORMAL HIGH (ref 70–99)

## 2021-03-18 LAB — BASIC METABOLIC PANEL
Anion gap: 11 (ref 5–15)
BUN: 72 mg/dL — ABNORMAL HIGH (ref 8–23)
CO2: 35 mmol/L — ABNORMAL HIGH (ref 22–32)
Calcium: 9.6 mg/dL (ref 8.9–10.3)
Chloride: 96 mmol/L — ABNORMAL LOW (ref 98–111)
Creatinine, Ser: 2.19 mg/dL — ABNORMAL HIGH (ref 0.44–1.00)
GFR, Estimated: 22 mL/min — ABNORMAL LOW (ref >60)
Glucose, Bld: 148 mg/dL — ABNORMAL HIGH (ref 70–99)
Potassium: 4.2 mmol/L (ref 3.5–5.1)
Sodium: 142 mmol/L (ref 135–145)

## 2021-03-18 MED ORDER — LANTUS SOLOSTAR 100 UNIT/ML ~~LOC~~ SOPN: 46.0000 [IU] | PEN_INJECTOR | Freq: Every day | SUBCUTANEOUS | Status: DC

## 2021-03-18 NOTE — Assessment & Plan Note (Signed)
She has required CPAP in the past.  Home sleep study showed significant desaturations although AHI was moderate degree.  She is scheduled for a formal titration study to see if she will do better with BiPAP Previous ABG has shown chronic compensated hypercarbia

## 2021-03-18 NOTE — TOC Transition Note (Addendum)
Transition of Care [facility]) - CM/SW Discharge Note   Patient Details  Name: [name] MRN: [id number] Date of Birth: June 19, 1935  [facility]) CM/SW Contact:  Dessa Phi, RN Phone Number: 03/18/2021, 9:52 AM   Clinical Narrative: For d/c home today w/home 02-awaiting home 02 orders to complete process for safe delivery of home 02 to rm prior d/c-nsg notified.No further CM needs.  9:58a-noted home 02 order placed. [employer] to deliver home 02 to rm prior d/c. Nsg notified.    Final next level of care: Home/Self Care Barriers to Discharge: No Barriers Identified   Patient Goals and CMS Choice   CMS Medicare.gov Compare Post Acute Care list provided to:: Patient Choice offered to / list presented to : Patient  Discharge Placement                       Discharge Plan and Services   Discharge Planning Services: CM Consult Post Acute Care Choice: Durable Medical Equipment          DME Arranged: Oxygen DME Agency: [employer] Date DME Agency Contacted: 03/18/21 Time DME Agency Contacted: [phone] Representative spoke with at DME Agency: [place] (Clara) Interventions     Readmission Risk Interventions Readmission Risk Prevention Plan 07/20/2019  Transportation Screening Complete  PCP or Specialist Appt within 3-5 Days Not Complete  Not Complete comments unknown DC date  [place] or [place] Complete  Social Work Consult for [place] Planning/Counseling Not Complete  SW consult not completed comments following for needs  Palliative Care Screening Not Complete  Medication Review (RN Transport planner) Referral to Pharmacy  Some recent data might be hidden

## 2021-03-18 NOTE — Progress Notes (Signed)
   Subjective:    Patient ID: [name], female    DOB: 1934-12-02, 86 y.o.   MRN: [id number]  HPI  86 yo ex-smoker for FU of  severe obstructive sleep apnea on nocturnal CPAP   Critical illness with pneumonitis, acute hypoxic respiratory failure, prolonged hospitalization 07/2019, was DNR, improved with BiPAP and steroids, required prolonged rehab but now is back on her feet and living independently.  PMH -  chronic kidney disease, ITP, A. fib and history of PE in 2010   O2 saturation dropped to 70% while in A-fib clinic. Pt is currently in ED at [facility].Daughter stated doctor at [facility] looked at CXR and noted bilateral pleural effusions and "fluid around lungs"  She was admitted to the hospital for acute exacerbation of chronic diastolic CHF and acute hypoxic respiratory failure.  She was treated with IV Lasix 80 every 12 her condition improved.  Unfortunately, she could not be weaned off of oxygen.  Oxygen saturation on room air at rest was 94%, oxygen saturation on room air while ambulating was 84% on oxygen saturation on 2 L of oxygen while ambulating was 90%.  2 L/min oxygen has been prescribed She was discharged from the hospital today and arrives straight from the hospital  BUN/creatinine 03/1271/2.2 Discharge weight was 224 pounds  We reviewed home sleep test which showed moderate OSA and significant drop in O2 levels and saturation study was scheduled  She arrives in a wheelchair accompanied by her daughter who corroborates history    Significant tests/ events reviewed HST 02/2021 showed mod OSA with AHI 15/ hr    NPSG in 04/2008 showed severe obstructive sleep apnea with AHI 85/h, TST 121 mins - no REM sleep. Subsequent titration study showed CPAP requirement of 15 cm with small comfort gel mask  07/2019 ABG 7.4 5/61/54  01/2021 VQ scan negative  Review of Systems neg for any significant sore throat, dysphagia, itching, sneezing, nasal congestion or  excess/ purulent secretions, fever, chills, sweats, unintended wt loss, pleuritic or exertional cp, hempoptysis, orthopnea pnd or change in chronic leg swelling. Also denies presyncope, palpitations, heartburn, abdominal pain, nausea, vomiting, diarrhea or change in bowel or urinary habits, dysuria,hematuria, rash, arthralgias, visual complaints, headache, numbness weakness or ataxia.     Objective:   Physical Exam  Gen. [place], elderly,obese, in no distress ENT - no lesions, no post nasal drip Neck: No JVD, no thyromegaly, no carotid bruits Lungs: no use of accessory muscles, no dullness to percussion, decreased without rales or rhonchi  Cardiovascular: Rhythm regular, heart sounds  normal, no murmurs or gallops, 2+  peripheral edema Musculoskeletal: No deformities, no cyanosis or clubbing , no tremors       Assessment & Plan:

## 2021-03-18 NOTE — Patient Instructions (Addendum)
Increase Lasix to 40 mg daily If you do not lose 3 pounds over the next 3 days, increase Lasix to 80 mg daily for 3 days, then back down to 40 mg daily. Dr. Joelyn Oms will recheck renal function in 10 days.  Stay on oxygen until CPAP titration study, then we will provide you with CPAP/BiPAPB

## 2021-03-18 NOTE — Assessment & Plan Note (Signed)
This seems to be related to fluid overload likely due to CKD and fluid retention with acute on chronic diastolic heart failure. She has done well with diuresis but continues to require oxygen. I have asked her to increase Lasix to 40 mg daily in a.m. for a weight loss of 1 pound per day  if you do not lose 3 pounds over the next 3 days, increase Lasix to 80 mg daily for 3 days, then back down to 40 mg daily. Dr. Joelyn Oms will recheck renal function in 10 days.

## 2021-03-18 NOTE — Discharge Summary (Addendum)
Physician Discharge Summary  [name] VFI:[id number] DOB: 04-Feb-1935 DOA: 03/14/2021  PCP: Caren Macadam, MD  Admit date: 03/14/2021 Discharge date: 03/18/2021  Discharge disposition: Home   Recommendations for Outpatient Follow-Up:   Follow up with oncologist within 1 week of discharge  Discharge Diagnosis:   Principal Problem:   Acute on chronic diastolic (congestive) heart failure (HCC) Active Problems:   Hypothyroidism   DM type 2 (diabetes mellitus, type 2) ([place])   HYPERCHOLESTEROLEMIA   Anxiety state   Essential hypertension   Acute on chronic respiratory failure with hypoxia (HCC)   Chronic kidney disease (CKD) stage G4/A1, severely decreased glomerular filtration rate (GFR) between 15-29 mL/min/1.73 square meter and albuminuria creatinine ratio less than 30 mg/g (HCC)   Atypical atrial flutter ([place])    Discharge Condition: Stable.  Diet recommendation:  Diet Order             Diet - low sodium heart healthy           Diet Heart Room service appropriate? Yes; Fluid consistency: Thin; Fluid restriction: 2000 mL Fluid  Diet effective now                     Code Status: DNR     Hospital Course:   [name] is a 86 y.o. female with past medical history significant for paroxysmal atrial fibrillation and atrial flutter off of Eliquis for several years because of thrombocytopenia and history of GI bleed of unclear etiology, pulmonary embolism, chronic diastolic CHF, CKD stage IV, hypertension, who presented to the hospital because of shortness of breath.  She initially went to the A. fib clinic but she was referred to the emergency room for management of exacerbation of CHF.   She was admitted to the hospital for acute exacerbation of chronic diastolic CHF and acute hypoxic respiratory failure.  She was treated with IV Lasix.  Her condition improved.  Unfortunately, she could not be weaned off of oxygen.  Oxygen saturation on  room air at rest was 94%, oxygen saturation on room air while ambulating was 84% on oxygen saturation on 2 L of oxygen while ambulating was 90%.  2 L/min oxygen has been prescribed for chronic hypoxic respiratory failure.  Of note, patient said she had used home oxygen in the past but she has not used home oxygen recently.      Discharge Exam:    Vitals:   03/17/21 1232 03/17/21 2009 03/18/21 0413 03/18/21 0442  BP: (!) 158/64 (!) 159/80  137/73  Pulse: 65 92  (!) 48  Resp: 18 18  18   Temp: (!) 97.4 F (36.3 C) (!) 97.5 F (36.4 C)  97.6 F (36.4 C)  TempSrc: Oral Oral  Oral  SpO2: 92% 100%  98%  Weight:   99.5 kg   Height:         GEN: NAD SKIN: Warm and dry EYES: No pallor or icterus ENT: MMM CV: RRR PULM: CTA B ABD: soft, ND, NT, +BS CNS: AAO x 3, non focal EXT: Trace bilateral leg edema.  Chronic erythematous changes on bilateral distal legs.  No tenderness   The results of significant diagnostics from this hospitalization (including imaging, microbiology, ancillary and laboratory) are listed below for reference.     Procedures and Diagnostic Studies:   DG Chest 2 View  Result Date: 03/14/2021 CLINICAL DATA:  Shortness of breath EXAM: CHEST - 2 VIEW COMPARISON:  01/30/2021, CT 07/04/2019 FINDINGS: Cardiomegaly with  small right greater than left bilateral effusions. Vascular congestion and mild diffuse ground-glass opacity likely pulmonary edema. Consolidation at right base. Aortic atherosclerosis. No pneumothorax. IMPRESSION: Cardiomegaly with small bilateral effusions, vascular congestion and mild pulmonary edema. Atelectasis or pneumonia at the right base. Electronically Signed   By: Donavan Foil M.D.   On: 03/14/2021 15:54     Labs:   Basic Metabolic Panel: Recent Labs  Lab 03/14/21 1605 03/15/21 0904 03/16/21 1041 03/17/21 0835 03/18/21 0738  NA 142 143 140 141 142  K 4.2 4.5 4.2 4.2 4.2  CL 104 104 98 97* 96*  CO2 28 31 34* 35* 35*  GLUCOSE 106*  155* 197* 163* 148*  BUN 66* 63* 64* 76* 72*  CREATININE 2.27* 2.20* 2.23* 2.25* 2.19*  CALCIUM 9.1 9.2 9.2 9.6 9.6  MG  --  2.1  --   --   --   PHOS  --  5.2*  --   --   --    GFR Estimated Creatinine Clearance: 21 mL/min (A) (by C-G formula based on SCr of 2.19 mg/dL (H)). Liver Function Tests: Recent Labs  Lab 03/13/21 1206 03/15/21 0904  AST 11* 16  ALT 11 15  ALKPHOS 68 63  BILITOT 0.6 0.8  PROT 7.1 7.0  ALBUMIN 4.3 3.8   No results for input(s): LIPASE, AMYLASE in the last 168 hours. No results for input(s): AMMONIA in the last 168 hours. Coagulation profile No results for input(s): INR, PROTIME in the last 168 hours.  CBC: Recent Labs  Lab 03/13/21 1206 03/14/21 1605 03/15/21 0904 03/16/21 1041 03/17/21 0835  WBC 6.0 5.3 5.0 5.0 5.2  NEUTROABS 4.4 3.7  --  3.6 3.7  HGB 11.0* 10.9* 10.2* 10.2* 10.5*  HCT 34.8* 33.5* 32.5* 32.0* 32.6*  MCV 94.8 95.2 95.6 95.0 93.7  PLT 134* 136* 136* 130* 135*   Cardiac Enzymes: No results for input(s): CKTOTAL, CKMB, CKMBINDEX, TROPONINI in the last 168 hours. BNP: Invalid input(s): POCBNP CBG: Recent Labs  Lab 03/17/21 1149 03/17/21 1636 03/17/21 2014 03/18/21 0805 03/18/21 1104  GLUCAP 192* 184* 262* 141* 169*   D-Dimer No results for input(s): DDIMER in the last 72 hours. Hgb A1c No results for input(s): HGBA1C in the last 72 hours. Lipid Profile No results for input(s): CHOL, HDL, LDLCALC, TRIG, CHOLHDL, LDLDIRECT in the last 72 hours. Thyroid function studies No results for input(s): TSH, T4TOTAL, T3FREE, THYROIDAB in the last 72 hours.  Invalid input(s): FREET3 Anemia work up No results for input(s): VITAMINB12, FOLATE, FERRITIN, TIBC, IRON, RETICCTPCT in the last 72 hours. Microbiology Recent Results (from the past 240 hour(s))  Resp Panel by RT-PCR (Flu A&B, Covid) Nasopharyngeal Swab     Status: None   Collection Time: 03/14/21  6:47 PM   Specimen: Nasopharyngeal Swab; Nasopharyngeal(NP) swabs in  vial transport medium  Result Value Ref Range Status   SARS Coronavirus 2 by RT PCR NEGATIVE NEGATIVE Final    Comment: (NOTE) SARS-CoV-2 target nucleic acids are NOT DETECTED.  The SARS-CoV-2 RNA is generally detectable in upper respiratory specimens during the acute phase of infection. The lowest concentration of SARS-CoV-2 viral copies this assay can detect is 138 copies/mL. A negative result does not preclude SARS-Cov-2 infection and should not be used as the sole basis for treatment or other patient management decisions. A negative result may occur with  improper specimen collection/handling, submission of specimen other than nasopharyngeal swab, presence of viral mutation(s) within the areas targeted by this assay, and inadequate  number of viral copies(<138 copies/mL). A negative result must be combined with clinical observations, patient history, and epidemiological information. The expected result is Negative.  Fact Sheet for Patients:  EntrepreneurPulse.com.au  Fact Sheet for Healthcare Providers:  IncredibleEmployment.be  This test is no t yet approved or cleared by the [place] FDA and  has been authorized for detection and/or diagnosis of SARS-CoV-2 by FDA under an Emergency Use Authorization (EUA). This EUA will remain  in effect (meaning this test can be used) for the duration of the COVID-19 declaration under Section 564(b)(1) of the Act, 21 U.S.C.section 360bbb-3(b)(1), unless the authorization is terminated  or revoked sooner.       Influenza A by PCR NEGATIVE NEGATIVE Final   Influenza B by PCR NEGATIVE NEGATIVE Final    Comment: (NOTE) The Xpert Xpress SARS-CoV-2/FLU/RSV plus assay is intended as an aid in the diagnosis of influenza from Nasopharyngeal swab specimens and should not be used as a sole basis for treatment. Nasal washings and aspirates are unacceptable for Xpert Xpress SARS-CoV-2/FLU/RSV testing.  Fact  Sheet for Patients: EntrepreneurPulse.com.au  Fact Sheet for Healthcare Providers: IncredibleEmployment.be  This test is not yet approved or cleared by the [place] FDA and has been authorized for detection and/or diagnosis of SARS-CoV-2 by FDA under an Emergency Use Authorization (EUA). This EUA will remain in effect (meaning this test can be used) for the duration of the COVID-19 declaration under Section 564(b)(1) of the Act, 21 U.S.C. section 360bbb-3(b)(1), unless the authorization is terminated or revoked.  Performed at [facility], [place] [address], [place], [place] [address]      Discharge Instructions:   Discharge Instructions     (HEART FAILURE PATIENTS) Call MD:  Anytime you have any of the following symptoms: 1) 3 pound weight gain in 24 hours or 5 pounds in 1 week 2) shortness of breath, with or without a dry hacking cough 3) swelling in the hands, feet or stomach 4) if you have to sleep on extra pillows at night in order to breathe.   Complete by: As directed    Amb Referral to HF Clinic   Complete by: As directed    Call MD for:  difficulty breathing, headache or visual disturbances   Complete by: As directed    Call MD for:  extreme fatigue   Complete by: As directed    Call MD for:  persistant dizziness or light-headedness   Complete by: As directed    Diet - low sodium heart healthy   Complete by: As directed    Discharge instructions   Complete by: As directed    Follow up with pulmonologist as scheduled on 03/18/2021   For home use only DME oxygen   Complete by: As directed    Length of Need: Lifetime   Liters per Minute: 2   Frequency: Continuous (stationary and portable oxygen unit needed)   Oxygen conserving device: Yes   Oxygen delivery system: Gas   Heart Failure patients record your daily weight using the same scale at the same time of day   Complete by: As directed    Increase activity  slowly   Complete by: As directed    Schedule appointment   Complete by: As directed    Follow up with PCP in 1 week      Allergies as of 03/18/2021       Reactions   Adhesive [tape] Other (See Comments)   PULLS OFF THE SKIN  Apixaban Other (See Comments)   Internal Bleeding   Aspirin Itching, Rash, Hives, Swelling   Swelling of her tongue   Mirabegron Other (See Comments)   Patient experienced A-Fib   Pineapple Anaphylaxis, Swelling   Throat swells and blisters on tongue and roof of mouth per patient   Metformin Diarrhea, Nausea Only   Tetracycline Hives   Fluticasone-salmeterol Itching, Rash   Iodinated Diagnostic Agents Itching, Rash      Lactose Intolerance (gi) Other (See Comments)   Gas   Latex Itching, Rash, Other (See Comments)   Pulls off the skin and causes welts   Lisinopril Cough   Metronidazole Other (See Comments)   Reaction not known   Sulfa Antibiotics Rash   Sulfonamide Derivatives Itching, Rash        Medication List     TAKE these medications    acetaminophen 325 MG tablet Commonly known as: TYLENOL Take 650 mg by mouth in the morning and at bedtime.   amLODipine 2.5 MG tablet Commonly known as: NORVASC Take 3 tablets (7.5 mg total) by mouth daily.   amoxicillin 500 MG tablet Commonly known as: AMOXIL Take 2,000 mg by mouth as needed. Prior to Dental Appt   atorvastatin 40 MG tablet Commonly known as: LIPITOR Take 1 tablet (40 mg total) by mouth at bedtime.   BD Pen Needle Nano 2nd Gen 32G X 4 MM Misc Generic drug: Insulin Pen Needle USE TO INJECT UP TO FOUR TIMES DAILY AS DIRECTED   bisacodyl 5 MG EC tablet Commonly known as: DULCOLAX Take 10 mg by mouth every 3 (three) days as needed (for constipation).   fexofenadine 180 MG tablet Commonly known as: ALLEGRA Take 180 mg by mouth daily.   FreeStyle Libre 14 Day Sensor Misc APPLY NEW SENSOR EVERY 14 DAYS AS DIRECTED   furosemide 40 MG tablet Commonly known as: LASIX TAKE  1 TABLET BY MOUTH THREE TIMES WEEKLY, ALTERNATING WITH 20MG  FOUR TIMES WEEKLY   furosemide 20 MG tablet Commonly known as: LASIX TAKE 1 TABLET BY MOUTH 4 TIMES EVERY WEEK, ALTERNATING WITH 40 MG THREE TIMES EVERY WEEK   HumaLOG KwikPen 100 UNIT/ML KwikPen Generic drug: insulin lispro INJECT 13 TO 20 UNITS UNDER THE SKIN THREE TIMES DAILY WITH BREAKFAST, LUNCH AND EVENING MEAL   hyoscyamine 0.125 MG tablet Commonly known as: LEVSIN Take 1 tablet by mouth daily.   INSULIN SYRINGE .3CC/29GX1/2" 29G X 1/2" 0.3 ML Misc Use to inject insulin 4 times a day.   Lantus SoloStar 100 UNIT/ML Solostar Pen Generic drug: insulin glargine Inject 46 Units into the skin at bedtime.   levothyroxine 112 MCG tablet Commonly known as: SYNTHROID Take 1 tablet (112 mcg total) by mouth daily.   metoprolol succinate 25 MG 24 hr tablet Commonly known as: Toprol XL Take 1 tablet (25 mg total) by mouth daily.   multivitamin tablet Take 1 tablet by mouth daily.   potassium citrate 10 MEQ (1080 MG) SR tablet Commonly known as: UROCIT-K Take 20 mEq by mouth in the morning and at bedtime.   sertraline 50 MG tablet Commonly known as: ZOLOFT TAKE 1 TABLET BY MOUTH AT BEDTIME   vitamin B-12 100 MCG tablet Commonly known as: CYANOCOBALAMIN Take 100 mcg by mouth daily.   vitamin C 250 MG tablet Commonly known as: ASCORBIC ACID Take 250 mg by mouth daily.   Vitamin D3 50 MCG (2000 UT) Tabs Take 2,000 Units by mouth daily.   Xeroform Petrolat Patch 4"x4" Pads Apply 1  Units topically daily as needed.               Durable Medical Equipment  (From admission, onward)           Start     Ordered   03/18/21 0000  For home use only DME oxygen       Question Answer Comment  Length of Need Lifetime   Liters per Minute 2   Frequency Continuous (stationary and portable oxygen unit needed)   Oxygen conserving device Yes   Oxygen delivery system Gas      03/18/21 0808             Follow-up Information     [facility] Follow up.   Why: home oxygen Contact information: [place] [address] [phone]                  Time coordinating discharge: 33 minutes  Signed:  Jennye Boroughs  [facility] 03/18/2021, 3:49 PM   Pager on [email or web address]. If 7PM-7AM, please contact night-coverage at [email or web address]

## 2021-03-18 NOTE — Addendum Note (Signed)
Encounter addended by: Sherran Needs, NP on: 03/18/2021 9:27 AM  Actions taken: Clinical Note Signed

## 2021-03-18 NOTE — Plan of Care (Signed)
  Problem: Clinical Measurements: Goal: Respiratory complications will improve Outcome: Progressing Goal: Cardiovascular complication will be avoided Outcome: Progressing   Problem: Activity: Goal: Risk for activity intolerance will decrease Outcome: Progressing   Problem: Nutrition: Goal: Adequate nutrition will be maintained Outcome: Progressing   Problem: Coping: Goal: Level of anxiety will decrease Outcome: Progressing   

## 2021-03-18 NOTE — Progress Notes (Signed)
RN explained and provided discharge instructions to pt and daughter. All questions answered. Home oxygen delivered to pt room. IV removed. Tele removed. Pt dressed in personal clothing and taken to the main entrance via wheelchair.

## 2021-03-20 DIAGNOSIS — H524 Presbyopia: Secondary | ICD-10-CM | POA: Diagnosis not present

## 2021-03-20 DIAGNOSIS — E119 Type 2 diabetes mellitus without complications: Secondary | ICD-10-CM | POA: Diagnosis not present

## 2021-03-20 DIAGNOSIS — Z961 Presence of intraocular lens: Secondary | ICD-10-CM | POA: Diagnosis not present

## 2021-03-20 DIAGNOSIS — H5201 Hypermetropia, right eye: Secondary | ICD-10-CM | POA: Diagnosis not present

## 2021-03-20 LAB — HM DIABETES EYE EXAM

## 2021-03-21 ENCOUNTER — Encounter: Payer: Self-pay | Admitting: Family

## 2021-03-24 ENCOUNTER — Encounter: Payer: Self-pay | Admitting: Internal Medicine

## 2021-03-24 ENCOUNTER — Other Ambulatory Visit: Payer: Self-pay

## 2021-03-24 ENCOUNTER — Ambulatory Visit (INDEPENDENT_AMBULATORY_CARE_PROVIDER_SITE_OTHER): Payer: Medicare Other

## 2021-03-24 VITALS — BP 140/60 | HR 68 | Temp 98.1°F | Ht 61.0 in | Wt 221.0 lb

## 2021-03-24 DIAGNOSIS — Z0000 Encounter for general adult medical examination without abnormal findings: Secondary | ICD-10-CM | POA: Diagnosis not present

## 2021-03-24 NOTE — Patient Instructions (Signed)
Ms. Cocke , Thank you for taking time to come for your Medicare Wellness Visit. I appreciate your ongoing commitment to your health goals. Please review the following plan we discussed and let me know if I can assist you in the future.   Screening recommendations/referrals: Colonoscopy: no longer required  Mammogram: no longer required  Bone Density: 05/11/2017  Recommended yearly ophthalmology/optometry visit for glaucoma screening and checkup Recommended yearly dental visit for hygiene and checkup  Vaccinations: Influenza vaccine: due in fall 2022 Pneumococcal vaccine: completed series  Tdap vaccine: 01/25/2006  due with injury  Shingles vaccine: declined     Advanced directives: will provide copies   Conditions/risks identified: none   Next appointment: none    Preventive Care 15 Years and Older, Female Preventive care refers to lifestyle choices and visits with your health care provider that can promote health and wellness. What does preventive care include? A yearly physical exam. This is also called an annual well check. Dental exams once or twice a year. Routine eye exams. Ask your health care provider how often you should have your eyes checked. Personal lifestyle choices, including: Daily care of your teeth and gums. Regular physical activity. Eating a healthy diet. Avoiding tobacco and drug use. Limiting alcohol use. Practicing safe sex. Taking low-dose aspirin every day. Taking vitamin and mineral supplements as recommended by your health care provider. What happens during an annual well check? The services and screenings done by your health care provider during your annual well check will depend on your age, overall health, lifestyle risk factors, and family history of disease. Counseling  Your health care provider may ask you questions about your: Alcohol use. Tobacco use. Drug use. Emotional well-being. Home and relationship well-being. Sexual  activity. Eating habits. History of falls. Memory and ability to understand (cognition). Work and work Statistician. Reproductive health. Screening  You may have the following tests or measurements: Height, weight, and BMI. Blood pressure. Lipid and cholesterol levels. These may be checked every 5 years, or more frequently if you are over 64 years old. Skin check. Lung cancer screening. You may have this screening every year starting at age 75 if you have a 30-pack-year history of smoking and currently smoke or have quit within the past 15 years. Fecal occult blood test (FOBT) of the stool. You may have this test every year starting at age 61. Flexible sigmoidoscopy or colonoscopy. You may have a sigmoidoscopy every 5 years or a colonoscopy every 10 years starting at age 46. Hepatitis C blood test. Hepatitis B blood test. Sexually transmitted disease (STD) testing. Diabetes screening. This is done by checking your blood sugar (glucose) after you have not eaten for a while (fasting). You may have this done every 1-3 years. Bone density scan. This is done to screen for osteoporosis. You may have this done starting at age 44. Mammogram. This may be done every 1-2 years. Talk to your health care provider about how often you should have regular mammograms. Talk with your health care provider about your test results, treatment options, and if necessary, the need for more tests. Vaccines  Your health care provider may recommend certain vaccines, such as: Influenza vaccine. This is recommended every year. Tetanus, diphtheria, and acellular pertussis (Tdap, Td) vaccine. You may need a Td booster every 10 years. Zoster vaccine. You may need this after age 49. Pneumococcal 13-valent conjugate (PCV13) vaccine. One dose is recommended after age 92. Pneumococcal polysaccharide (PPSV23) vaccine. One dose is recommended after age 68.  Talk to your health care provider about which screenings and vaccines  you need and how often you need them. This information is not intended to replace advice given to you by your health care provider. Make sure you discuss any questions you have with your health care provider. Document Released: 09/20/2015 Document Revised: 05/13/2016 Document Reviewed: 06/25/2015 Elsevier Interactive Patient Education  2017 [place] Prevention in the Home Falls can cause injuries. They can happen to people of all ages. There are many things you can do to make your home safe and to help prevent falls. What can I do on the outside of my home? Regularly fix the edges of walkways and driveways and fix any cracks. Remove anything that might make you trip as you walk through a door, such as a raised step or threshold. Trim any bushes or trees on the path to your home. Use bright outdoor lighting. Clear any walking paths of anything that might make someone trip, such as rocks or tools. Regularly check to see if handrails are loose or broken. Make sure that both sides of any steps have handrails. Any raised decks and porches should have guardrails on the edges. Have any leaves, snow, or ice cleared regularly. Use sand or salt on walking paths during winter. Clean up any spills in your garage right away. This includes oil or grease spills. What can I do in the bathroom? Use night lights. Install grab bars by the toilet and in the tub and shower. Do not use towel bars as grab bars. Use non-skid mats or decals in the tub or shower. If you need to sit down in the shower, use a plastic, non-slip stool. Keep the floor dry. Clean up any water that spills on the floor as soon as it happens. Remove soap buildup in the tub or shower regularly. Attach bath mats securely with double-sided non-slip rug tape. Do not have throw rugs and other things on the floor that can make you trip. What can I do in the bedroom? Use night lights. Make sure that you have a light by your bed that  is easy to reach. Do not use any sheets or blankets that are too big for your bed. They should not hang down onto the floor. Have a firm chair that has side arms. You can use this for support while you get dressed. Do not have throw rugs and other things on the floor that can make you trip. What can I do in the kitchen? Clean up any spills right away. Avoid walking on wet floors. Keep items that you use a lot in easy-to-reach places. If you need to reach something above you, use a strong step stool that has a grab bar. Keep electrical cords out of the way. Do not use floor polish or wax that makes floors slippery. If you must use wax, use non-skid floor wax. Do not have throw rugs and other things on the floor that can make you trip. What can I do with my stairs? Do not leave any items on the stairs. Make sure that there are handrails on both sides of the stairs and use them. Fix handrails that are broken or loose. Make sure that handrails are as long as the stairways. Check any carpeting to make sure that it is firmly attached to the stairs. Fix any carpet that is loose or worn. Avoid having throw rugs at the top or bottom of the stairs. If you do have throw  rugs, attach them to the floor with carpet tape. Make sure that you have a light switch at the top of the stairs and the bottom of the stairs. If you do not have them, ask someone to add them for you. What else can I do to help prevent falls? Wear shoes that: Do not have high heels. Have rubber bottoms. Are comfortable and fit you well. Are closed at the toe. Do not wear sandals. If you use a stepladder: Make sure that it is fully opened. Do not climb a closed stepladder. Make sure that both sides of the stepladder are locked into place. Ask someone to hold it for you, if possible. Clearly mark and make sure that you can see: Any grab bars or handrails. First and last steps. Where the edge of each step is. Use tools that help you  move around (mobility aids) if they are needed. These include: Canes. Walkers. Scooters. Crutches. Turn on the lights when you go into a dark area. Replace any light bulbs as soon as they burn out. Set up your furniture so you have a clear path. Avoid moving your furniture around. If any of your floors are uneven, fix them. If there are any pets around you, be aware of where they are. Review your medicines with your doctor. Some medicines can make you feel dizzy. This can increase your chance of falling. Ask your doctor what other things that you can do to help prevent falls. This information is not intended to replace advice given to you by your health care provider. Make sure you discuss any questions you have with your health care provider. Document Released: 06/20/2009 Document Revised: 01/30/2016 Document Reviewed: 09/28/2014 Elsevier Interactive Patient Education  2017 [employer].

## 2021-03-24 NOTE — Progress Notes (Signed)
Subjective:   [name] is a 86 y.o. female who presents for Medicare Annual (Subsequent) preventive examination.  Review of Systems    N/a       Objective:    There were no vitals filed for this visit. There is no height or weight on file to calculate BMI.  Advanced Directives 03/15/2021 03/14/2021 03/13/2021 01/30/2021 01/07/2021 11/07/2020 09/05/2020  Does Patient Have a Medical Advance Directive? - Yes Yes Yes Yes Yes Yes  Type of Paramedic of [place];Living will [place];Living will Living will;Healthcare Power of [place];Living will [place];Living will [place];Living will [place];Living will  Does patient want to make changes to medical advance directive? No - Patient declined - No - Patient declined No - Patient declined - - No - Patient declined  Copy of Healthcare Power of Attorney in Chart? - - - No - copy requested No - copy requested No - copy requested No - copy requested  Would patient like information on creating a medical advance directive? - - - No - Patient declined No - Patient declined No - Patient declined No - Patient declined  Pre-existing out of facility DNR order (yellow form or pink MOST form) - - - - - - -    Current Medications (verified) Outpatient Encounter Medications as of 03/24/2021  Medication Sig   acetaminophen (TYLENOL) 325 MG tablet Take 650 mg by mouth in the morning and at bedtime.   amLODipine (NORVASC) 2.5 MG tablet Take 3 tablets (7.5 mg total) by mouth daily.   amoxicillin (AMOXIL) 500 MG tablet Take 2,000 mg by mouth as needed. Prior to Dental Appt   atorvastatin (LIPITOR) 40 MG tablet Take 1 tablet (40 mg total) by mouth at bedtime.   BD PEN NEEDLE NANO 2ND GEN 32G X 4 MM MISC USE TO INJECT UP TO FOUR TIMES DAILY AS DIRECTED   bisacodyl (DULCOLAX) 5 MG EC tablet Take 10 mg by mouth every 3 (three)  days as needed (for constipation).   Bismuth Tribromoph-Petrolatum (XEROFORM PETROLAT PATCH 4"X4") PADS Apply 1 Units topically daily as needed.   Cholecalciferol (VITAMIN D3) 2000 units TABS Take 2,000 Units by mouth daily.   Continuous Blood Gluc Sensor (FREESTYLE LIBRE 14 DAY SENSOR) MISC APPLY NEW SENSOR EVERY 14 DAYS AS DIRECTED   fexofenadine (ALLEGRA) 180 MG tablet Take 180 mg by mouth daily.   furosemide (LASIX) 20 MG tablet TAKE 1 TABLET BY MOUTH 4 TIMES EVERY WEEK, ALTERNATING WITH 40 MG THREE TIMES EVERY WEEK   furosemide (LASIX) 40 MG tablet TAKE 1 TABLET BY MOUTH THREE TIMES WEEKLY, ALTERNATING WITH 20MG  FOUR TIMES WEEKLY   HUMALOG KWIKPEN 100 UNIT/ML KwikPen INJECT 13 TO 20 UNITS UNDER THE SKIN THREE TIMES DAILY WITH BREAKFAST, LUNCH AND EVENING MEAL   hyoscyamine (LEVSIN) 0.125 MG tablet Take 1 tablet by mouth daily.   insulin glargine (LANTUS SOLOSTAR) 100 UNIT/ML Solostar Pen Inject 46 Units into the skin at bedtime.   Insulin Syringe-Needle U-100 (INSULIN SYRINGE .3CC/29GX1/2") 29G X 1/2" 0.3 ML MISC Use to inject insulin 4 times a day.   levothyroxine (SYNTHROID) 112 MCG tablet Take 1 tablet (112 mcg total) by mouth daily.   metoprolol succinate (TOPROL XL) 25 MG 24 hr tablet Take 1 tablet (25 mg total) by mouth daily.   Multiple Vitamin (MULTIVITAMIN) tablet Take 1 tablet by mouth daily.   potassium citrate (UROCIT-K) 10 MEQ (1080  MG) SR tablet Take 20 mEq by mouth in the morning and at bedtime.   sertraline (ZOLOFT) 50 MG tablet TAKE 1 TABLET BY MOUTH AT BEDTIME   vitamin B-12 (CYANOCOBALAMIN) 100 MCG tablet Take 100 mcg by mouth daily.   vitamin C (ASCORBIC ACID) 250 MG tablet Take 250 mg by mouth daily.   No facility-administered encounter medications on file as of 03/24/2021.    Allergies (verified) Adhesive [tape], Apixaban, Aspirin, Mirabegron, Pineapple, Metformin, Tetracycline, Fluticasone-salmeterol, Iodinated diagnostic agents, Lactose intolerance (gi), Latex,  Lisinopril, Metronidazole, Sulfa antibiotics, and Sulfonamide derivatives   History: Past Medical History:  Diagnosis Date   Allergic rhinitis    Anxiety state, unspecified    panic attacks   CHF (congestive heart failure) (HCC)    Depressive disorder, not elsewhere classified    Extrinsic asthma, unspecified    no problem since adulthood   History of complications due to general anesthesia 03/03/2021   Obesity    OSA on CPAP    severe   Pneumonitis 07/14/2019   Pressure injury of skin 07/10/2019   Pulmonary embolism ([place])    Pure hypercholesterolemia    Respiratory failure with hypoxia ([place]) 09/2008   acute, secondary to multiple bilateral pulmonary embolism , negative hypercoagulable workup 09/2008 hospital stay   Scoliosis    Type II or unspecified type diabetes mellitus without mention of complication, not stated as uncontrolled    Unspecified hypothyroidism    hypo   Past Surgical History:  Procedure Laterality Date   CARDIOVERSION N/A 06/26/2019   Procedure: CARDIOVERSION;  Surgeon: Jerline Pain, MD;  Location: [facility];  Service: Cardiovascular;  Laterality: N/A;   EYE SURGERY Bilateral 2005   cataracts, implants   KIDNEY STONE SURGERY Bilateral    2016, 2017    knee replaced Left 2013   THYROID SURGERY  1966   nodule removal; partial thyroidectomy; radioactive iodine x2; 34's   Family History  Problem Relation Age of [place]   Hypertension Father    Hyperlipidemia Father    Allergies Mother    Clotting disorder Mother    Osteoarthritis Mother    Asthma Mother    Hypertension Mother    Stroke Mother    Coronary artery disease Other    Stroke Other    Rheum arthritis Maternal Grandmother    Clotting disorder Maternal Grandmother    Clotting disorder Maternal Uncle    Arthritis Daughter    Diabetes Daughter    High blood pressure Daughter    High Cholesterol Daughter    Depression Maternal Grandfather    Diabetes Paternal Grandmother    Breast cancer  Neg Hx    Social History   Socioeconomic History   Marital status: Widowed    Spouse name: Not on file   Number of children: 2   Years of education: Not on file   Highest education level: Not on file  Occupational History   Occupation: OWNER    Employer: [employer]    Comment: Self employed- runs Advertising copywriter  Tobacco Use   Smoking status: Former    Packs/day: 0.25    Years: 20.00    Pack years: 5.00    Types: Cigarettes    Start date: 1958    Quit date: 09/07/1989    Years since quitting: 31.5   Smokeless tobacco: Never  Vaping Use   Vaping Use: Never used  Substance and Sexual Activity   Alcohol use: No   Drug use: No   Sexual activity: Not  Currently  Other Topics Concern   Not on file  Social History Narrative   Lives at [facility] Strain: Not on file  Food Insecurity: Not on file  Transportation Needs: Not on file  Physical Activity: Not on file  Stress: Not on file  Social Connections: Not on file    Tobacco Counseling Counseling given: Not Answered   Clinical Intake:                 Diabetic?yes Nutrition Risk Assessment:  Has the patient had any N/V/D within the last 2 months?  No  Does the patient have any non-healing wounds?  No  Has the patient had any unintentional weight loss or weight gain?  No   Diabetes:  Is the patient diabetic?  Yes  If diabetic, was a CBG obtained today?  No  Did the patient bring in their glucometer from home?  No  How often do you monitor your CBG's? daily.   Financial Strains and Diabetes Management:  Are you having any financial strains with the device, your supplies or your medication? No .  Does the patient want to be seen by Chronic Care Management for management of their diabetes?  No  Would the patient like to be referred to a Nutritionist or for Diabetic Management?  No   Diabetic Exams:  Diabetic Eye Exam: Completed  03/20/2021 Diabetic Foot Exam: Overdue, Pt has been advised about the importance in completing this exam. Pt is scheduled for diabetic foot exam on next office visit .          Activities of Daily Living In your present state of health, do you have any difficulty performing the following activities: 03/17/2021 03/15/2021  Hearing? - N  Comment - -  Vision? - N  Comment - -  Difficulty concentrating or making decisions? - N  Walking or climbing stairs? - Y  Dressing or bathing? - Y  Doing errands, shopping? Tempie Donning  Some recent data might be hidden    Patient Care Team: Caren Macadam, MD as PCP - General (Family Medicine) Jerline Pain, MD as PCP - Cardiology (Cardiology) Rigoberto Noel, MD as Consulting Physician (Pulmonary Disease) Katy Apo, MD as Consulting Physician (Ophthalmology) Preminger, Adrienne Mocha, MD as Referring Physician (Urology) Rexene Agent, MD as Attending Physician (Nephrology) Philemon Kingdom, MD as Consulting Physician (Internal Medicine) Marin Olp Rudell Cobb, MD as Consulting Physician (Oncology)  Indicate any recent Medical Services you may have received from other than [facility] providers in the past year (date may be approximate).     Assessment:   This is a routine wellness examination for Rilley.  Hearing/Vision screen No results found.  Dietary issues and exercise activities discussed:     Goals Addressed   None    Depression Screen PHQ 2/9 Scores 03/23/2017 01/24/2016 04/03/2015  PHQ - 2 Score 1 1 5   PHQ- 9 Score - - 15    Fall Risk Fall Risk  06/16/2017 06/09/2017 06/09/2017 03/23/2017 01/24/2016  Falls in the past year? No No - Yes Yes  Number falls in past yr: - - - 1 2 or more  Injury with Fall? - - - No No  Risk Factor Category  - - - - High Fall Risk  Risk for fall due to : Impaired balance/gait;Impaired mobility Impaired mobility Other (Comment) Impaired balance/gait;Impaired mobility -  Risk for fall due to: Comment - - Instructed  to  use walker at all times. - -  Follow up - - - Falls prevention discussed;Education provided Falls prevention discussed    FALL RISK PREVENTION PERTAINING TO THE HOME:  Any stairs in or around the home? Yes  If so, are there any without handrails? No  Home free of loose throw rugs in walkways, pet beds, electrical cords, etc? Yes  Adequate lighting in your home to reduce risk of falls? Yes   ASSISTIVE DEVICES UTILIZED TO PREVENT FALLS:  Life alert? Yes  Use of a cane, walker or w/c? Yes  Grab bars in the bathroom? Yes  Shower chair or bench in shower? Yes  Elevated toilet seat or a handicapped toilet? Yes   TIMED UP AND GO:  Was the test performed? Yes .  Length of time to ambulate 10 feet: in wheel chair  sec.   Gait slow and steady with assistive device  Cognitive Function:        Immunizations Immunization History  Administered Date(s) Administered   Fluad Quad(high Dose 65+) 05/27/2020   Influenza Split 06/09/2011   Influenza Whole 07/23/2008, 06/13/2009, 06/07/2012   Influenza, High Dose Seasonal PF 05/08/2015, 05/19/2018   Moderna Sars-Covid-2 Vaccination 09/18/2019, 10/16/2019, 09/10/2020   Pneumococcal Conjugate-13 12/15/2016   Pneumococcal Polysaccharide-23 01/30/2008   Pneumococcal-Unspecified 09/07/2013   Td 01/25/2006   Zoster, Live 06/09/2011    TDAP status: Due, Education has been provided regarding the importance of this vaccine. Advised may receive this vaccine at local pharmacy or Health Dept. Aware to provide a copy of the vaccination record if obtained from local pharmacy or Health Dept. Verbalized acceptance and understanding.  Flu Vaccine status: Up to date  Pneumococcal vaccine status: Up to date  Covid-19 vaccine status: Completed vaccines  Qualifies for Shingles Vaccine? Yes   Zostavax completed No   Shingrix Completed?: No.    Education has been provided regarding the importance of this vaccine. Patient has been advised to call  insurance company to determine out of pocket expense if they have not yet received this vaccine. Advised may also receive vaccine at local pharmacy or Health Dept. Verbalized acceptance and understanding.  Screening Tests Health Maintenance  Topic Date Due   URINE MICROALBUMIN  09/10/2016   FOOT EXAM  12/15/2017   COVID-19 Vaccine (4 - Booster for Moderna series) 12/09/2020   OPHTHALMOLOGY EXAM  02/25/2021   TETANUS/TDAP  05/20/2021 (Originally 01/26/2016)   Zoster Vaccines- Shingrix (1 of 2) 06/06/2021 (Originally 08/04/1954)   INFLUENZA VACCINE  04/07/2021   HEMOGLOBIN A1C  07/20/2021   DEXA SCAN  Completed   PNA vac Low Risk Adult  Completed   HPV VACCINES  Aged Out    Health Maintenance  Health Maintenance Due  Topic Date Due   URINE MICROALBUMIN  09/10/2016   FOOT EXAM  12/15/2017   COVID-19 Vaccine (4 - Booster for Moderna series) 12/09/2020   OPHTHALMOLOGY EXAM  02/25/2021    Colorectal cancer screening: No longer required.   Mammogram status: No longer required due to age.  Bone Density status: Completed 05/11/2017. Results reflect: Bone density results: OSTEOPENIA. Repeat every 5 years.  Lung Cancer Screening: (Low Dose CT Chest recommended if Age 84-80 years, 30 pack-year currently smoking OR have quit w/in 15years.) does not qualify.   Lung Cancer Screening Referral: n/a  Additional Screening:  Hepatitis C Screening: does not qualify  Vision Screening: Recommended annual ophthalmology exams for early detection of glaucoma and other disorders of the eye. Is the patient up to date with  their annual eye exam?  Yes  Who is the provider or what is the name of the office in which the patient attends annual eye exams? Dr.Lyles  If pt is not established with a provider, would they like to be referred to a provider to establish care? No .   Dental Screening: Recommended annual dental exams for proper oral hygiene  Community Resource Referral / Chronic Care  Management: CRR required this visit?  No   CCM required this visit?  No      Plan:     I have personally reviewed and noted the following in the patient's chart:   Medical and social history Use of alcohol, tobacco or illicit drugs  Current medications and supplements including opioid prescriptions.  Functional ability and status Nutritional status Physical activity Advanced directives List of other physicians Hospitalizations, surgeries, and ER visits in previous 12 months Vitals Screenings to include cognitive, depression, and falls Referrals and appointments  In addition, I have reviewed and discussed with patient certain preventive protocols, quality metrics, and best practice recommendations. A written personalized care plan for preventive services as well as general preventive health recommendations were provided to patient.     Randel Pigg, LPN   [id number]   Nurse Notes: none

## 2021-03-25 ENCOUNTER — Other Ambulatory Visit: Payer: Self-pay

## 2021-03-26 ENCOUNTER — Ambulatory Visit (INDEPENDENT_AMBULATORY_CARE_PROVIDER_SITE_OTHER): Payer: Medicare Other | Admitting: Family Medicine

## 2021-03-26 ENCOUNTER — Encounter: Payer: Self-pay | Admitting: Family Medicine

## 2021-03-26 VITALS — BP 118/58 | HR 64 | Temp 98.5°F | Ht 61.0 in | Wt 221.1 lb

## 2021-03-26 DIAGNOSIS — E1122 Type 2 diabetes mellitus with diabetic chronic kidney disease: Secondary | ICD-10-CM | POA: Diagnosis not present

## 2021-03-26 DIAGNOSIS — D649 Anemia, unspecified: Secondary | ICD-10-CM

## 2021-03-26 DIAGNOSIS — I5033 Acute on chronic diastolic (congestive) heart failure: Secondary | ICD-10-CM | POA: Diagnosis not present

## 2021-03-26 DIAGNOSIS — E039 Hypothyroidism, unspecified: Secondary | ICD-10-CM

## 2021-03-26 DIAGNOSIS — Z9981 Dependence on supplemental oxygen: Secondary | ICD-10-CM

## 2021-03-26 DIAGNOSIS — Z794 Long term (current) use of insulin: Secondary | ICD-10-CM | POA: Diagnosis not present

## 2021-03-26 DIAGNOSIS — N184 Chronic kidney disease, stage 4 (severe): Secondary | ICD-10-CM | POA: Diagnosis not present

## 2021-03-26 DIAGNOSIS — I10 Essential (primary) hypertension: Secondary | ICD-10-CM

## 2021-03-26 DIAGNOSIS — J9611 Chronic respiratory failure with hypoxia: Secondary | ICD-10-CM | POA: Diagnosis not present

## 2021-03-26 LAB — CBC WITH DIFFERENTIAL/PLATELET
Basophils Absolute: 0 10*3/uL (ref 0.0–0.1)
Basophils Relative: 0.2 % (ref 0.0–3.0)
Eosinophils Absolute: 0.2 10*3/uL (ref 0.0–0.7)
Eosinophils Relative: 3.5 % (ref 0.0–5.0)
HCT: 29.7 % — ABNORMAL LOW (ref 36.0–46.0)
Hemoglobin: 10 g/dL — ABNORMAL LOW (ref 12.0–15.0)
Lymphocytes Relative: 19.3 % (ref 12.0–46.0)
Lymphs Abs: 0.9 10*3/uL (ref 0.7–4.0)
MCHC: 33.8 g/dL (ref 30.0–36.0)
MCV: 89.5 fl (ref 78.0–100.0)
Monocytes Absolute: 0.4 10*3/uL (ref 0.1–1.0)
Monocytes Relative: 8.1 % (ref 3.0–12.0)
Neutro Abs: 3.2 10*3/uL (ref 1.4–7.7)
Neutrophils Relative %: 68.9 % (ref 43.0–77.0)
Platelets: 126 10*3/uL — ABNORMAL LOW (ref 150.0–400.0)
RBC: 3.32 Mil/uL — ABNORMAL LOW (ref 3.87–5.11)
RDW: 17 % — ABNORMAL HIGH (ref 11.5–15.5)
WBC: 4.7 10*3/uL (ref 4.0–10.5)

## 2021-03-26 LAB — HEMOGLOBIN A1C: Hgb A1c MFr Bld: 6.9 % — ABNORMAL HIGH (ref 4.6–6.5)

## 2021-03-26 LAB — COMPREHENSIVE METABOLIC PANEL
ALT: 11 U/L (ref 0–35)
AST: 11 U/L (ref 0–37)
Albumin: 4 g/dL (ref 3.5–5.2)
Alkaline Phosphatase: 55 U/L (ref 39–117)
BUN: 83 mg/dL — ABNORMAL HIGH (ref 6–23)
CO2: 30 mEq/L (ref 19–32)
Calcium: 9.5 mg/dL (ref 8.4–10.5)
Chloride: 100 mEq/L (ref 96–112)
Creatinine, Ser: 2.44 mg/dL — ABNORMAL HIGH (ref 0.40–1.20)
GFR: 17.85 mL/min — ABNORMAL LOW (ref >60.00)
Glucose, Bld: 156 mg/dL — ABNORMAL HIGH (ref 70–99)
Potassium: 5.1 mEq/L (ref 3.5–5.1)
Sodium: 140 mEq/L (ref 135–145)
Total Bilirubin: 0.4 mg/dL (ref 0.2–1.2)
Total Protein: 6.5 g/dL (ref 6.0–8.3)

## 2021-03-26 LAB — TSH: TSH: 0.41 u[IU]/mL (ref 0.35–5.50)

## 2021-03-26 LAB — BRAIN NATRIURETIC PEPTIDE: Pro B Natriuretic peptide (BNP): 168 pg/mL — ABNORMAL HIGH (ref 0.0–100.0)

## 2021-03-26 NOTE — Patient Instructions (Signed)
Let me know if heart rate is regularly less than 60 (or cardiology) or if blood pressures are regularly less than 65 diastolic or regularly less than 552 systolic.

## 2021-03-26 NOTE — Progress Notes (Signed)
[name] DOB: [birth year] Encounter date: 03/26/2021  This is a 86 y.o. female who presents with Chief Complaint  Patient presents with   Hospitalization Follow-up    History of present illness:  Admitted 03/14/21; discharged home 03/18/21. Acute on chronic HF. Presented to a fib clinic with sob but referred to ER for management of chf exacerbation. Tx with IV lasix; difficulty with weaning off oxygen and discharged on 2L.   Has follow ups set up with sleep, pulm, oncology in next month.   Has been monitoring at home.  Pulse has stayed low: 57-98. Bp today was 118/58 in office and she feels well today.   She has volunteered to do in hosp sleep eval with bipap. She has changed her mind about this (previously didn't want to do this). Hard to keep her oxygen on at night; wakes up with it on the floor. Hard to get around house with oxygen even though she has long cord. Has day person M-F 10-6 and evening person 8p-8a to help with transitions but doing well with this. Planning on wearing oxygen until she has follow up with pulm. Monitoring weight and trying to get 5lb loss fluid before follow up. Taking lasix 40mg  daily. 2L fluid limit daily. Sees nephro Friday. They would like to have bloodwork here today to check trend before seeing nephro. Losing less oxygen with walking around.  Only time she has felt not as well was today in shower - just a little short of breath. No cough. Did cough when she got out of the hospital.   Did end up taking antibiotics that she had left over for the salivary stone. This resolved symptoms and she no longer has any issues with this.    Allergies  Allergen Reactions   Adhesive [Tape] Other (See Comments)    PULLS OFF THE SKIN   Apixaban Other (See Comments)    Internal Bleeding   Aspirin Itching, Rash, Hives and Swelling    Swelling of her tongue   Mirabegron Other (See Comments)    Patient experienced A-Fib   Pineapple Anaphylaxis and Swelling     Throat swells and blisters on tongue and roof of mouth per patient   Metformin Diarrhea and Nausea Only   Tetracycline Hives   Fluticasone-Salmeterol Itching and Rash   Iodinated Diagnostic Agents Itching and Rash        Lactose Intolerance (Gi) Other (See Comments)    Gas    Latex Itching, Rash and Other (See Comments)    Pulls off the skin and causes welts   Lisinopril Cough   Metronidazole Other (See Comments)    Reaction not known   Sulfa Antibiotics Rash   Sulfonamide Derivatives Itching and Rash   Current Meds  Medication Sig   acetaminophen (TYLENOL) 325 MG tablet Take 650 mg by mouth in the morning and at bedtime.   amLODipine (NORVASC) 2.5 MG tablet Take 3 tablets (7.5 mg total) by mouth daily.   amoxicillin (AMOXIL) 500 MG tablet Take 2,000 mg by mouth as needed. Prior to Dental Appt   atorvastatin (LIPITOR) 40 MG tablet Take 1 tablet (40 mg total) by mouth at bedtime.   BD PEN NEEDLE NANO 2ND GEN 32G X 4 MM MISC USE TO INJECT UP TO FOUR TIMES DAILY AS DIRECTED   bisacodyl (DULCOLAX) 5 MG EC tablet Take 10 mg by mouth every 3 (three) days as needed (for constipation).   Bismuth Tribromoph-Petrolatum (XEROFORM PETROLAT PATCH 4"X4") PADS Apply 1 Units  topically daily as needed.   Cholecalciferol (VITAMIN D3) 2000 units TABS Take 2,000 Units by mouth daily.   Continuous Blood Gluc Sensor (FREESTYLE LIBRE 14 DAY SENSOR) MISC APPLY NEW SENSOR EVERY 14 DAYS AS DIRECTED   fexofenadine (ALLEGRA) 180 MG tablet Take 180 mg by mouth daily.   furosemide (LASIX) 20 MG tablet TAKE 1 TABLET BY MOUTH 4 TIMES EVERY WEEK, ALTERNATING WITH 40 MG THREE TIMES EVERY WEEK (Patient taking differently: Take 40 mg by mouth daily.)   furosemide (LASIX) 40 MG tablet TAKE 1 TABLET BY MOUTH THREE TIMES WEEKLY, ALTERNATING WITH 20MG  FOUR TIMES WEEKLY (Patient taking differently: Take 40 mg by mouth daily.)   HUMALOG KWIKPEN 100 UNIT/ML KwikPen INJECT 13 TO 20 UNITS UNDER THE SKIN THREE TIMES DAILY  WITH BREAKFAST, LUNCH AND EVENING MEAL   hyoscyamine (LEVSIN) 0.125 MG tablet Take 1 tablet by mouth daily.   insulin glargine (LANTUS SOLOSTAR) 100 UNIT/ML Solostar Pen Inject 46 Units into the skin at bedtime.   Insulin Syringe-Needle U-100 (INSULIN SYRINGE .3CC/29GX1/2") 29G X 1/2" 0.3 ML MISC Use to inject insulin 4 times a day.   levothyroxine (SYNTHROID) 112 MCG tablet Take 1 tablet (112 mcg total) by mouth daily.   metoprolol succinate (TOPROL XL) 25 MG 24 hr tablet Take 1 tablet (25 mg total) by mouth daily.   Multiple Vitamin (MULTIVITAMIN) tablet Take 1 tablet by mouth daily.   potassium citrate (UROCIT-K) 10 MEQ (1080 MG) SR tablet Take 20 mEq by mouth in the morning and at bedtime.   sertraline (ZOLOFT) 50 MG tablet TAKE 1 TABLET BY MOUTH AT BEDTIME   vitamin B-12 (CYANOCOBALAMIN) 100 MCG tablet Take 100 mcg by mouth daily.   vitamin C (ASCORBIC ACID) 250 MG tablet Take 250 mg by mouth daily.    Review of Systems  Constitutional:  Negative for chills, fatigue (improved) and fever.  Respiratory:  Negative for cough, chest tightness, shortness of breath (breathing is feeling much better) and wheezing.   Cardiovascular:  Negative for chest pain, palpitations and leg swelling.   Objective:  BP (!) 118/58 (BP Location: Left Arm, Patient Position: Sitting, Cuff Size: Large)   Pulse 64   Temp 98.5 F (36.9 C) (Rectal)   Ht 5\' 1"  (1.549 m)   Wt 221 lb 1.6 oz (100.3 kg)   LMP  (LMP Unknown)   SpO2 98%   BMI 41.78 kg/m   Weight: 221 lb 1.6 oz (100.3 kg)   BP Readings from Last 3 Encounters:  03/26/21 (!) 118/58  03/24/21 140/60  03/18/21 122/60   Wt Readings from Last 3 Encounters:  03/26/21 221 lb 1.6 oz (100.3 kg)  03/24/21 221 lb (100.2 kg)  03/18/21 224 lb (101.6 kg)    Physical Exam Constitutional:      General: She is not in acute distress.    Appearance: She is well-developed.  Cardiovascular:     Rate and Rhythm: Normal rate and regular rhythm.     Heart  sounds: Normal heart sounds. No murmur heard.   No friction rub.  Pulmonary:     Effort: Pulmonary effort is normal. No respiratory distress.     Breath sounds: Normal breath sounds. No wheezing or rales.  Musculoskeletal:     Right lower leg: 1+ Pitting Edema present.     Left lower leg: 1+ Pitting Edema present.  Neurological:     Mental Status: She is alert and oriented to person, place, and time.  Psychiatric:  Behavior: Behavior normal.    Assessment/Plan  1. Essential hypertension Encouraged her to continue to monitor blood pressures at home.  She feels well, even blood pressures are on the low end of normal.  I have asked her to let us know (or cardiology) if diastolic pressures are regularly less than 65 diastolic or systolic pressures are regularly less than 110.  Also let us know if heart rate is regularly less than 60.  2. Chronic respiratory failure with hypoxia, on home oxygen therapy (HCC) She is maintaining oxygen saturations on 2 L nasal cannula.  She does feel her breathing overall doing better.  She does seem to maintain saturations for short bursts of activity.  She will continue to wear 2 L and monitor oxygen levels until she has her follow-up visit with pulmonology.  3. Chronic kidney disease (CKD) stage G4/A1, severely decreased glomerular filtration rate (GFR) between 15-29 mL/min/1.73 square meter and albuminuria creatinine ratio less than 30 mg/g [facility]) She is following regularly with nephrology.  Requested blood work today so that she will have this for her upcoming nephrology office visit on Friday. - Comprehensive metabolic panel; Future - Comprehensive metabolic panel  4. Anemia, unspecified type We will recheck blood counts to make sure stable. - CBC with Differential/Platelet; Future - CBC with Differential/Platelet  5. Type 2 diabetes mellitus with stage 4 chronic kidney disease, with long-term current use of insulin ([place]) She does follow  regularly with endocrinology.  Blood sugars have been quite good.  She is careful about what she is eating and is monitoring at home. - Hemoglobin A1c  6. Hypothyroidism, unspecified type Continue with Synthroid 112 mcg daily. - TSH; Future - TSH  7. Acute on chronic diastolic CHF (congestive heart failure) ([place]) She is monitoring daily weights and continuing with Lasix as directed.  She is limiting salt and fluid intake. - Brain natriuretic peptide; Future - Brain natriuretic peptide  Return in about 3 months (around 06/26/2021) for Chronic condition visit.      Micheline Rough, MD

## 2021-03-27 ENCOUNTER — Telehealth: Payer: Self-pay | Admitting: Family Medicine

## 2021-03-27 ENCOUNTER — Encounter: Payer: Self-pay | Admitting: Family Medicine

## 2021-03-27 DIAGNOSIS — I484 Atypical atrial flutter: Secondary | ICD-10-CM

## 2021-03-27 DIAGNOSIS — M546 Pain in thoracic spine: Secondary | ICD-10-CM | POA: Diagnosis not present

## 2021-03-27 DIAGNOSIS — M542 Cervicalgia: Secondary | ICD-10-CM | POA: Diagnosis not present

## 2021-03-27 DIAGNOSIS — M9901 Segmental and somatic dysfunction of cervical region: Secondary | ICD-10-CM | POA: Diagnosis not present

## 2021-03-27 DIAGNOSIS — I10 Essential (primary) hypertension: Secondary | ICD-10-CM

## 2021-03-27 DIAGNOSIS — I2510 Atherosclerotic heart disease of native coronary artery without angina pectoris: Secondary | ICD-10-CM

## 2021-03-27 DIAGNOSIS — M9902 Segmental and somatic dysfunction of thoracic region: Secondary | ICD-10-CM | POA: Diagnosis not present

## 2021-03-27 NOTE — Telephone Encounter (Signed)
[name] (daughter) called to follow up on her mothers appointment from yesterday with Dr. Ethlyn Gallery. She is concerned about her BP and wanted to know if they need to reduce her BP medication.  Please advise

## 2021-03-28 DIAGNOSIS — N183 Chronic kidney disease, stage 3 unspecified: Secondary | ICD-10-CM | POA: Diagnosis not present

## 2021-03-28 DIAGNOSIS — I129 Hypertensive chronic kidney disease with stage 1 through stage 4 chronic kidney disease, or unspecified chronic kidney disease: Secondary | ICD-10-CM | POA: Diagnosis not present

## 2021-03-28 DIAGNOSIS — I5030 Unspecified diastolic (congestive) heart failure: Secondary | ICD-10-CM | POA: Diagnosis not present

## 2021-03-28 DIAGNOSIS — N200 Calculus of kidney: Secondary | ICD-10-CM | POA: Diagnosis not present

## 2021-03-28 NOTE — Telephone Encounter (Signed)
Spoke with the patients daughter and she stated the pts BP was 130/60 while at the nephrology office today.  Also wanted to let Dr Ethlyn Gallery know the physician changed sigs for Lasix to 40mg  BID.  Stated she mis-spoke as she thought the diastolic reading was 39  and the caregiver told her the systolic reading was "735". Message sent to PCP.

## 2021-03-28 NOTE — Addendum Note (Signed)
Addended by: Agnes Lawrence on: 03/28/2021 04:47 PM   Modules accepted: Orders

## 2021-03-28 NOTE — Telephone Encounter (Signed)
Spoke with the pts daughter, informed her of the message below and also updated the medication list.

## 2021-04-03 DIAGNOSIS — M546 Pain in thoracic spine: Secondary | ICD-10-CM | POA: Diagnosis not present

## 2021-04-03 DIAGNOSIS — M542 Cervicalgia: Secondary | ICD-10-CM | POA: Diagnosis not present

## 2021-04-03 DIAGNOSIS — M9901 Segmental and somatic dysfunction of cervical region: Secondary | ICD-10-CM | POA: Diagnosis not present

## 2021-04-03 DIAGNOSIS — M9902 Segmental and somatic dysfunction of thoracic region: Secondary | ICD-10-CM | POA: Diagnosis not present

## 2021-04-04 NOTE — Telephone Encounter (Signed)
Pt is calling in stating that she would like to have a referral to Dr Buford Dresser (Cardiologist at [facility]) b/c when she called them they stated that they need a referral before she can make an appointment with them.

## 2021-04-07 ENCOUNTER — Ambulatory Visit (HOSPITAL_BASED_OUTPATIENT_CLINIC_OR_DEPARTMENT_OTHER): Payer: Medicare Other | Attending: Pulmonary Disease | Admitting: Pulmonary Disease

## 2021-04-07 ENCOUNTER — Other Ambulatory Visit: Payer: Self-pay

## 2021-04-07 ENCOUNTER — Telehealth: Payer: Self-pay | Admitting: Cardiology

## 2021-04-07 DIAGNOSIS — G4733 Obstructive sleep apnea (adult) (pediatric): Secondary | ICD-10-CM | POA: Insufficient documentation

## 2021-04-07 NOTE — Telephone Encounter (Signed)
done

## 2021-04-07 NOTE — Telephone Encounter (Signed)
  Patient would like to switch from Dr Marlou Porch to Dr Harrell Gave

## 2021-04-08 NOTE — Telephone Encounter (Signed)
OK by me, I am in [place] office only (just so she knows)

## 2021-04-09 ENCOUNTER — Ambulatory Visit: Payer: BLUE CROSS/BLUE SHIELD | Admitting: Primary Care

## 2021-04-10 DIAGNOSIS — M546 Pain in thoracic spine: Secondary | ICD-10-CM | POA: Diagnosis not present

## 2021-04-10 DIAGNOSIS — M542 Cervicalgia: Secondary | ICD-10-CM | POA: Diagnosis not present

## 2021-04-10 DIAGNOSIS — M9902 Segmental and somatic dysfunction of thoracic region: Secondary | ICD-10-CM | POA: Diagnosis not present

## 2021-04-10 DIAGNOSIS — M9901 Segmental and somatic dysfunction of cervical region: Secondary | ICD-10-CM | POA: Diagnosis not present

## 2021-04-15 ENCOUNTER — Telehealth: Payer: Self-pay | Admitting: Pulmonary Disease

## 2021-04-15 DIAGNOSIS — G4733 Obstructive sleep apnea (adult) (pediatric): Secondary | ICD-10-CM

## 2021-04-15 DIAGNOSIS — Z23 Encounter for immunization: Secondary | ICD-10-CM

## 2021-04-15 NOTE — Telephone Encounter (Signed)
Called and spoke with patient, she states she would like to know the results of her sleep study. She also states that she was supposed to see BW on 8/3, and appointment got cancelled due to sickness. I have rescheduled patient for 8/29 with BW.  Dr. Elsworth Soho please advise  Thank you

## 2021-04-15 NOTE — Telephone Encounter (Signed)
Spoke with the pt and notified of results of sleep study. Pt verbalized understanding and was agreeable to CPAP therapy. I have placed DME referral for this and pt aware to contact the office for 31-90 day f/u once they begin using machine per insurance requirement.   

## 2021-04-15 NOTE — Telephone Encounter (Signed)
CPAP 12 cm was required with her sleeping ina  recliner If she is agreeable, we can send Rx to DME for same. I think CPAP will really help her

## 2021-04-15 NOTE — Telephone Encounter (Signed)
Pt is calling in regards to sleep study results that was done on 04/07/2021. Pls regard; [phone] or [phone].

## 2021-04-15 NOTE — Procedures (Signed)
Patient Name: [name], [name] Date: 04/07/2021 Gender: Female D.O.B: [birth year] Age (years): 68 Referring Provider: Kara Mead MD, ABSM Height (inches): 61 Interpreting Physician: Kara Mead MD, ABSM Weight (lbs): 219 RPSGT: Gwenyth Allegra BMI: 41 MRN: [id number] Neck Size: 14.00 <br> <br> CLINICAL INFORMATION The patient is referred for a CPAP titration to treat sleep apnea.    Date of  HST: 02/2021 mod OSA with AHI 15/ hr    NPSG in 04/2008 showed severe obstructive sleep apnea with AHI 85/h, TST 121 mins - no REM sleep. Subsequent titration study showed CPAP requirement of 15 cm with small comfort gel mask  SLEEP STUDY TECHNIQUE As per the AASM Manual for the Scoring of Sleep and Associated Events v2.3 (April 2016) with a hypopnea requiring 4% desaturations.  The channels recorded and monitored were frontal, central and occipital EEG, electrooculogram (EOG), submentalis EMG (chin), nasal and oral airflow, thoracic and abdominal wall motion, anterior tibialis EMG, snore microphone, electrocardiogram, and pulse oximetry. Continuous positive airway pressure (CPAP) was initiated at the beginning of the study and titrated to treat sleep-disordered breathing.  MEDICATIONS Medications self-administered by patient taken the night of the study : Lantus Solostar, ZOLOFT  TECHNICIAN COMMENTS Comments added by technician: Patient slept in a recliner chair.  RESPIRATORY PARAMETERS Optimal PAP Pressure (cm): 12 AHI at Optimal Pressure (/hr): 0.3 Overall Minimal O2 (%): 85.0 Supine % at Optimal Pressure (%): 100 Minimal O2 at Optimal Pressure (%): 90.0   SLEEP ARCHITECTURE The study was initiated at 10:56:38 PM and ended at 4:58:00 AM.  Sleep onset time was 20.8 minutes and the sleep efficiency was 77.6%%. The total sleep time was 280.5 minutes.  The patient spent 8.0%% of the night in stage N1 sleep, 71.7%% in stage N2 sleep, 0.0%% in stage N3 and 20.3% in REM.Stage REM latency  was 125.0 minutes  Wake after sleep onset was 60.1. Alpha intrusion was absent. Supine sleep was 100.00%.  CARDIAC DATA The 2 lead EKG demonstrated sinus rhythm. The mean heart rate was N/A beats per minute. Other EKG findings include: Atrial Fibrillation.  LEG MOVEMENT DATA The total Periodic Limb Movements of Sleep (PLMS) were 0. The PLMS index was 0.0. A PLMS index of <15 is considered normal in adults.  IMPRESSIONS - The optimal PAP pressure was 12 cm of water.  Patientcompletedthe study in a reclinerand was sittinguprightfor a majorityof itShe slept in a recliner for most of the night. - Moderate oxygen desaturations were observed during this titration (min O2 = 85.0%). - The patient snored with moderate snoring volume during this titration study. - 2-lead EKG demonstrated: Atrial Fibrillation - Clinically significant periodic limb movements were not noted during this study. Arousals associated with PLMs were rare.   DIAGNOSIS - Obstructive Sleep Apnea (G47.33)   RECOMMENDATIONS - Trial of CPAP therapy on 12 cm H2O with a Medium size Fisher&Paykel Full Face Mask F&P Vitera (new) mask and heated humidification. - Avoid alcohol, sedatives and other CNS depressants that may worsen sleep apnea and disrupt normal sleep architecture. - Sleep hygiene should be reviewed to assess factors that may improve sleep quality. - Weight management and regular exercise should be initiated or continued. - Return to [facility] for re-evaluation after 4 weeks of therapy    Kara Mead MD Board Certified in [place]

## 2021-04-17 DIAGNOSIS — M9901 Segmental and somatic dysfunction of cervical region: Secondary | ICD-10-CM | POA: Diagnosis not present

## 2021-04-17 DIAGNOSIS — M9902 Segmental and somatic dysfunction of thoracic region: Secondary | ICD-10-CM | POA: Diagnosis not present

## 2021-04-17 DIAGNOSIS — M546 Pain in thoracic spine: Secondary | ICD-10-CM | POA: Diagnosis not present

## 2021-04-17 DIAGNOSIS — M542 Cervicalgia: Secondary | ICD-10-CM | POA: Diagnosis not present

## 2021-04-18 ENCOUNTER — Other Ambulatory Visit: Payer: Self-pay | Admitting: *Deleted

## 2021-04-18 ENCOUNTER — Telehealth: Payer: Self-pay | Admitting: Family Medicine

## 2021-04-18 DIAGNOSIS — M6281 Muscle weakness (generalized): Secondary | ICD-10-CM | POA: Diagnosis not present

## 2021-04-18 DIAGNOSIS — J9690 Respiratory failure, unspecified, unspecified whether with hypoxia or hypercapnia: Secondary | ICD-10-CM | POA: Diagnosis not present

## 2021-04-18 DIAGNOSIS — J40 Bronchitis, not specified as acute or chronic: Secondary | ICD-10-CM | POA: Diagnosis not present

## 2021-04-18 DIAGNOSIS — I872 Venous insufficiency (chronic) (peripheral): Secondary | ICD-10-CM

## 2021-04-18 DIAGNOSIS — G4733 Obstructive sleep apnea (adult) (pediatric): Secondary | ICD-10-CM | POA: Diagnosis not present

## 2021-04-18 NOTE — Telephone Encounter (Signed)
Spoke with the patient at the number below and she stated she is not sure why [employer] was calling.  I called [employer] at [phone] and Mardene Celeste stated she does not see a note in the system, she will send a note to both teams that are treating the patient and have them return a call.

## 2021-04-18 NOTE — Telephone Encounter (Signed)
[facility] called to advise that they are calling in regards to the PTs Certificate of Medical Necessity and need to talk to [facility] nurse in regards to this.

## 2021-04-20 ENCOUNTER — Other Ambulatory Visit: Payer: Self-pay | Admitting: Internal Medicine

## 2021-04-20 DIAGNOSIS — Z760 Encounter for issue of repeat prescription: Secondary | ICD-10-CM

## 2021-04-23 DIAGNOSIS — N184 Chronic kidney disease, stage 4 (severe): Secondary | ICD-10-CM | POA: Diagnosis not present

## 2021-04-24 DIAGNOSIS — M542 Cervicalgia: Secondary | ICD-10-CM | POA: Diagnosis not present

## 2021-04-24 DIAGNOSIS — M546 Pain in thoracic spine: Secondary | ICD-10-CM | POA: Diagnosis not present

## 2021-04-24 DIAGNOSIS — M9901 Segmental and somatic dysfunction of cervical region: Secondary | ICD-10-CM | POA: Diagnosis not present

## 2021-04-24 DIAGNOSIS — M9902 Segmental and somatic dysfunction of thoracic region: Secondary | ICD-10-CM | POA: Diagnosis not present

## 2021-04-25 DIAGNOSIS — I129 Hypertensive chronic kidney disease with stage 1 through stage 4 chronic kidney disease, or unspecified chronic kidney disease: Secondary | ICD-10-CM | POA: Diagnosis not present

## 2021-04-25 DIAGNOSIS — N183 Chronic kidney disease, stage 3 unspecified: Secondary | ICD-10-CM | POA: Diagnosis not present

## 2021-04-25 DIAGNOSIS — N200 Calculus of kidney: Secondary | ICD-10-CM | POA: Diagnosis not present

## 2021-04-25 DIAGNOSIS — I5030 Unspecified diastolic (congestive) heart failure: Secondary | ICD-10-CM | POA: Diagnosis not present

## 2021-04-30 ENCOUNTER — Telehealth: Payer: Self-pay | Admitting: Internal Medicine

## 2021-04-30 DIAGNOSIS — L600 Ingrowing nail: Secondary | ICD-10-CM | POA: Diagnosis not present

## 2021-04-30 DIAGNOSIS — E1122 Type 2 diabetes mellitus with diabetic chronic kidney disease: Secondary | ICD-10-CM

## 2021-04-30 DIAGNOSIS — B351 Tinea unguium: Secondary | ICD-10-CM | POA: Diagnosis not present

## 2021-04-30 DIAGNOSIS — L603 Nail dystrophy: Secondary | ICD-10-CM | POA: Diagnosis not present

## 2021-04-30 DIAGNOSIS — N184 Chronic kidney disease, stage 4 (severe): Secondary | ICD-10-CM

## 2021-04-30 MED ORDER — INSULIN LISPRO (1 UNIT DIAL) 100 UNIT/ML (KWIKPEN): PEN_INJECTOR | SUBCUTANEOUS | 1 refills | Status: DC

## 2021-04-30 NOTE — Telephone Encounter (Signed)
Pt is calling to request for Humalog to be refilled. And states the pharmacy says they keep requesting a refill

## 2021-04-30 NOTE — Telephone Encounter (Signed)
Rx sent to preferred pharmacy.

## 2021-05-01 ENCOUNTER — Ambulatory Visit (HOSPITAL_BASED_OUTPATIENT_CLINIC_OR_DEPARTMENT_OTHER): Payer: Medicare Other

## 2021-05-01 ENCOUNTER — Ambulatory Visit (INDEPENDENT_AMBULATORY_CARE_PROVIDER_SITE_OTHER): Payer: Medicare Other

## 2021-05-01 ENCOUNTER — Encounter (HOSPITAL_BASED_OUTPATIENT_CLINIC_OR_DEPARTMENT_OTHER): Payer: Self-pay | Admitting: Cardiology

## 2021-05-01 ENCOUNTER — Ambulatory Visit (INDEPENDENT_AMBULATORY_CARE_PROVIDER_SITE_OTHER): Payer: Medicare Other | Admitting: Cardiology

## 2021-05-01 ENCOUNTER — Other Ambulatory Visit: Payer: Self-pay

## 2021-05-01 VITALS — BP 145/40 | HR 43 | Ht 61.0 in | Wt 224.6 lb

## 2021-05-01 DIAGNOSIS — I4891 Unspecified atrial fibrillation: Secondary | ICD-10-CM | POA: Diagnosis not present

## 2021-05-01 DIAGNOSIS — R001 Bradycardia, unspecified: Secondary | ICD-10-CM

## 2021-05-01 DIAGNOSIS — I10 Essential (primary) hypertension: Secondary | ICD-10-CM

## 2021-05-01 DIAGNOSIS — I5032 Chronic diastolic (congestive) heart failure: Secondary | ICD-10-CM

## 2021-05-01 DIAGNOSIS — J9611 Chronic respiratory failure with hypoxia: Secondary | ICD-10-CM | POA: Diagnosis not present

## 2021-05-01 NOTE — Patient Instructions (Addendum)
Medication Instructions:  STOP METOPROLOL  Keep taking the torsemide 40 mg daily. I will reach out to Dr. Joelyn Oms to get your recent labs.  *If you need a refill on your cardiac medications before your next appointment, please call your pharmacy*   Lab Work: None ordered today   Testing/Procedures: We will place a monitor today, with live telemetry Any loss of consciousness needs 911 Lightheadedness/dizziness with low blood pressure needs to come to the ER.  ZIO AT Long term monitor-Live Telemetry  Your physician has requested you wear a ZIO patch monitor for 7 days.  This is a single patch monitor. [employer] supplies one patch monitor per enrollment. Additional  stickers are not available.  Please do not apply patch if you will be having a Nuclear Stress Test, Echocardiogram, Cardiac CT, MRI,  or Chest Xray during the period you would be wearing the monitor. The patch cannot be worn during  these tests. You cannot remove and re-apply the ZIO AT patch monitor.  Your ZIO patch monitor will be mailed 3 day USPS to your address on file. It may take 3-5 days to  receive your monitor after you have been enrolled.  Once you have received your monitor, please review the enclosed instructions. Your monitor has  already been registered assigning a specific monitor serial # to you.   Billing and Patient Assistance Program information  [name] has been supplied with any insurance information on record for billing. [employer] offers a sliding scale Patient Assistance Program for patients without insurance, or whose  insurance does not completely cover the cost of the ZIO patch monitor. You must apply for the  Patient Assistance Program to qualify for the discounted rate. To apply, call [employer] at [phone],  select option 4, select option 2 , ask to apply for the Patient Assistance Program, (you can request an  interpreter if needed). [employer] will ask your household income and how many people  are in your  household. [employer] will quote your out-of-pocket cost based on this information. They will also be able  to set up a 12 month interest free payment plan if needed.  Applying the monitor   Shave hair from upper left chest.  Hold the abrader disc by orange tab. Rub the abrader in 40 strokes over left upper chest as indicated in  your monitor instructions.  Clean area with 4 enclosed alcohol pads. Use all pads to ensure the area is cleaned thoroughly. Let  dry.  Apply patch as indicated in monitor instructions. Patch will be placed under collarbone on left side of  chest with arrow pointing upward.  Rub patch adhesive wings for 2 minutes. Remove the white label marked "1". Remove the white label  marked "2". Rub patch adhesive wings for 2 additional minutes.  While looking in a mirror, press and release button in center of patch. A small green light will flash 3-4  times. This will be your only indicator that the monitor has been turned on.  Do not shower for the first 24 hours. You may shower after the first 24 hours.  Press the button if you feel a symptom. You will hear a small click. Record Date, Time and Symptom in  the Patient Log.   Starting the Gateway  In your kit there is a Hydrographic surveyor box the size of a cellphone. This is Airline pilot. It transmits all your  recorded data to [facility]. This box must always stay within 10 feet of you. Open the  box and push the *  button. There will be a light that blinks orange and then green a few times. When the light stops  blinking, the Gateway is connected to the ZIO patch. Call [employer] at [phone] to confirm your monitor is transmitting.  Returning your monitor  Remove your patch and place it inside the [place]. In the lower half of the Gateway there is a white  bag with prepaid postage on it. Place Gateway in bag and seal. Mail package back to [facility] as soon as  possible. Your physician should have your final report  approximately 7 days after you have mailed back  your monitor. Call [facility] at [phone] if you have questions regarding your ZIO AT  patch monitor. Call them immediately if you see an orange light blinking on your monitor.  If your monitor falls off in less than 4 days, contact our Monitor department at [phone]. If your  monitor becomes loose or falls off after 4 days call [employer] at [phone] for suggestions on  securing your monitor    Follow-Up: At [facility], you and your health needs are our priority.  As part of our continuing mission to provide you with exceptional heart care, we have created designated Provider Care Teams.  These Care Teams include your primary Cardiologist (physician) and Advanced Practice Providers (APPs -  Physician Assistants and Nurse Practitioners) who all work together to provide you with the care you need, when you need it.  We recommend signing up for the patient portal called "MyChart".  Sign up information is provided on this After Visit Summary.  MyChart is used to connect with patients for Virtual Visits (Telemedicine).  Patients are able to view lab/test results, encounter notes, upcoming appointments, etc.  Non-urgent messages can be sent to your provider as well.   To learn more about what you can do with MyChart, go to [email or web address].    Your next appointment:   3-4 week(s)  The format for your next appointment:   In Person  Provider:   Buford Dresser, MD

## 2021-05-01 NOTE — Progress Notes (Signed)
Cardiology Office Note:    Date:  05/01/2021   ID:  [name], DOB [birth year], MRN [id number]  PCP:  Caren Macadam, MD  Cardiologist:  Buford Dresser, MD  Referring MD: Caren Macadam, MD   CC: transfer of care/hospital follow up  History of Present Illness:    [name] is a 86 y.o. female with a hx of chronic diastolic heart failure, with admission for acute exacerbation 03/2021, paroxysmal atrial fibrillation, chronic thrombocytopenia, hypertension, hypercholesterolemia, chronic respiratory failure with hypoxia on home O2, chronic kidney disease stage 4, diabetes, hypothyroidism, obesity, and OSA on CPAP who is seen for hospital follow-up. She was admitted to the hospital 03/14/2021 for acute on chronic diastolic congestive heart failure. She is a new patient to me today, previously followed by Dr. Marlou Porch.   Team: Dr. Marin Olp: heme Dr. Joelyn Oms: nephrology Dr. Ethlyn Gallery: PCP Dr. Cruzita Lederer: endocrinology Dr. Elsworth Soho: pulmonology/sleep medicine  Today she is accompanied by a family member, who also provides some history. We reviewed the recent history of events. She was seen by Ermalinda Barrios on 03/05/2021 with worsening palpitations. Metoprolol succinate 25 mg daily added at that time. She followed up with Roderic Palau in the [facility] on 03/14/21. She noted worsening shortness of breath over the prior two weeks, and she was noted to have O2 sats in the upper 70s/low 80s on room air. She was sent to [facility] ER and admitted until 03/18/2021. She was discharged on home O2.  Since leaving the hospital, she was feeling better. At first she didn't need to use oxygen most of the time. However, she does feel better while using oxygen. She has a pulse ox at home, and will use oxygen when her saturation drops below 95%. She states she has not returned to her pre-admission baseline.  She has not felt her heart racing since beginning her new medications. Last night, her  heart rate was in the 30's. This morning she did not take metoprolol, her last dose was yesterday morning. Her blood pressure was 128/60 at home.  She is waiting on a new CPAP machine, and is not using one currently. Her old machine was recalled.   Recently, Dr. Joelyn Oms started her on 40 mg torsemide, which seems to be working for her. Blood work was done last week at her visit with Dr. Joelyn Oms. She has been taking Lasix for so long that she believes it became ineffective.  Tomorrow she has venous ultrasounds of her legs scheduled.  She denies any chest pain. No lightheadedness, headaches, syncope, orthopnea, or PND.    Past Medical History:  Diagnosis Date   Allergic rhinitis    Anxiety state, unspecified    panic attacks   CHF (congestive heart failure) (HCC)    Depressive disorder, not elsewhere classified    Extrinsic asthma, unspecified    no problem since adulthood   History of complications due to general anesthesia 03/03/2021   Hypertensive urgency 01/30/2021   Obesity    OSA on CPAP    severe   Pneumonitis 07/14/2019   Pressure injury of skin 07/10/2019   Pulmonary embolism ([place])    Pure hypercholesterolemia    Respiratory failure with hypoxia ([place]) 09/2008   acute, secondary to multiple bilateral pulmonary embolism , negative hypercoagulable workup 09/2008 hospital stay   Scoliosis    Type II or unspecified type diabetes mellitus without mention of complication, not stated as uncontrolled    Unspecified hypothyroidism    hypo    Past  Surgical History:  Procedure Laterality Date   CARDIOVERSION N/A 06/26/2019   Procedure: CARDIOVERSION;  Surgeon: Jerline Pain, MD;  Location: [facility] ENDOSCOPY;  Service: Cardiovascular;  Laterality: N/A;   EYE SURGERY Bilateral 2005   cataracts, implants   KIDNEY STONE SURGERY Bilateral    2016, 2017    knee replaced Left 2013   THYROID SURGERY  1966   nodule removal; partial thyroidectomy; radioactive iodine x2; 1990's    Current  Medications: Current Outpatient Medications on File Prior to Visit  Medication Sig   acetaminophen (TYLENOL) 325 MG tablet Take 650 mg by mouth in the morning and at bedtime.   amLODipine (NORVASC) 2.5 MG tablet Take 3 tablets (7.5 mg total) by mouth daily.   amoxicillin (AMOXIL) 500 MG tablet Take 2,000 mg by mouth as needed. Prior to Dental Appt   atorvastatin (LIPITOR) 40 MG tablet Take 1 tablet (40 mg total) by mouth at bedtime.   BD PEN NEEDLE NANO 2ND GEN 32G X 4 MM MISC USE TO INJECT UP TO FOUR TIMES DAILY AS DIRECTED   bisacodyl (DULCOLAX) 5 MG EC tablet Take 10 mg by mouth every 3 (three) days as needed (for constipation).   Bismuth Tribromoph-Petrolatum (XEROFORM PETROLAT PATCH 4"X4") PADS Apply 1 Units topically daily as needed.   Cholecalciferol (VITAMIN D3) 2000 units TABS Take 2,000 Units by mouth daily.   Continuous Blood Gluc Sensor (FREESTYLE LIBRE 14 DAY SENSOR) MISC APPLY NEW SENSOR EVERY 14 DAYS AS DIRECTED   fexofenadine (ALLEGRA) 180 MG tablet Take 180 mg by mouth daily.   hyoscyamine (LEVSIN) 0.125 MG tablet Take 1 tablet by mouth daily.   insulin glargine (LANTUS SOLOSTAR) 100 UNIT/ML Solostar Pen Inject 46 Units into the skin at bedtime.   insulin lispro (HUMALOG KWIKPEN) 100 UNIT/ML KwikPen INJECT 13 TO 20 UNITS UNDER THE SKIN THREE TIMES DAILY WITH BREAKFAST, LUNCH AND EVENING MEAL   Insulin Syringe-Needle U-100 (INSULIN SYRINGE .3CC/29GX1/2") 29G X 1/2" 0.3 ML MISC Use to inject insulin 4 times a day.   levothyroxine (SYNTHROID) 112 MCG tablet TAKE 1 TABLET(112 MCG) BY MOUTH DAILY   Multiple Vitamin (MULTIVITAMIN) tablet Take 1 tablet by mouth daily.   potassium citrate (UROCIT-K) 10 MEQ (1080 MG) SR tablet Take 20 mEq by mouth in the morning and at bedtime.   sertraline (ZOLOFT) 50 MG tablet TAKE 1 TABLET BY MOUTH AT BEDTIME   vitamin B-12 (CYANOCOBALAMIN) 100 MCG tablet Take 100 mcg by mouth daily.   vitamin C (ASCORBIC ACID) 250 MG tablet Take 250 mg by mouth  daily.   torsemide (DEMADEX) 20 MG tablet Take 40 mg by mouth every morning.   No current facility-administered medications on file prior to visit.     Allergies:   Adhesive [tape], Apixaban, Aspirin, Mirabegron, Pineapple, Metformin, Tetracycline, Fluticasone-salmeterol, Iodinated diagnostic agents, Lactose intolerance (gi), Latex, Lisinopril, Metronidazole, Sulfa antibiotics, and Sulfonamide derivatives   Social History   Tobacco Use   Smoking status: Former    Packs/day: 0.25    Years: 20.00    Pack years: 5.00    Types: Cigarettes    Start date: 45    Quit date: 09/07/1989    Years since quitting: 31.6   Smokeless tobacco: Never  Vaping Use   Vaping Use: Never used  Substance Use Topics   Alcohol use: No   Drug use: No    Family History: family history includes Allergies in her mother; Arthritis in her daughter; Asthma in her mother; Clotting disorder in her maternal  grandmother, maternal uncle, and mother; Coronary artery disease in an other family member; Depression in her maternal grandfather; Diabetes in her daughter and paternal grandmother; High Cholesterol in her daughter; High blood pressure in her daughter; Hyperlipidemia in her father; Hypertension in her father and mother; Osteoarthritis in her mother; Rheum arthritis in her maternal grandmother; Stroke in her mother and another family member. There is no history of Breast cancer. Mother had a pacemaker, and multiple strokes. Maternal Uncle had pacemaker and CABG.  ROS:   Please see the history of present illness.  Additional pertinent ROS: Constitutional: Negative for chills, fever, night sweats, unintentional weight loss  HENT: Negative for ear pain and hearing loss.   Eyes: Negative for loss of vision and eye pain.  Respiratory: Positive for shortness of breath. Negative for cough, sputum, wheezing.   Cardiovascular: See HPI. Gastrointestinal: Negative for abdominal pain, melena, and hematochezia.   Genitourinary: Negative for dysuria and hematuria.  Musculoskeletal: Negative for falls and myalgias.  Skin: Negative for itching and rash.  Neurological: Negative for focal weakness, focal sensory changes and loss of consciousness.  Endo/Heme/Allergies: Does not bruise/bleed easily.     EKGs/Labs/Other Studies Reviewed:    The following studies were reviewed today:  Echo 01/31/2021: 1. Left ventricular ejection fraction, by estimation, is 60 to 65%. The  left ventricle has normal function. The left ventricle has no regional  wall motion abnormalities. There is moderate concentric left ventricular  hypertrophy. Left ventricular  diastolic function could not be evaluated.   2. Right ventricular systolic function is normal. The right ventricular  size is normal. There is normal pulmonary artery systolic pressure. The  estimated right ventricular systolic pressure is 16.1 mmHg.   3. Left atrial size was moderately dilated.   4. Right atrial size was mildly dilated.   5. A small pericardial effusion is present. The pericardial effusion is  posterior to the left ventricle.   6. The mitral valve is degenerative. Mild mitral valve regurgitation. No  evidence of mitral stenosis.   7. The aortic valve is tricuspid. There is mild calcification of the  aortic valve. There is mild thickening of the aortic valve. Aortic valve  regurgitation is not visualized. Mild aortic valve sclerosis is present,  with no evidence of aortic valve  stenosis.   8. The inferior vena cava is normal in size with greater than 50%  respiratory variability, suggesting right atrial pressure of 3 mmHg.   Comparison(s): Changes from prior study are noted. The left ventricular  function has improved.   Echo 07/05/2019:  1. Left ventricular ejection fraction, by visual estimation, is 50 to  55%. The left ventricle has normal function. There is no left ventricular  hypertrophy.   2. Elevated left ventricular  end-diastolic pressure.   3. Left ventricular diastolic parameters are consistent with Grade I  diastolic dysfunction (impaired relaxation).   4. Global right ventricle has normal systolic function.The right  ventricular size is normal. No increase in right ventricular wall  thickness.   5. Left atrial size was mildly dilated.   6. Right atrial size was normal.   7. Small pericardial effusion.   8. The mitral valve is normal in structure. Trace mitral valve  regurgitation. No evidence of mitral stenosis.   9. The tricuspid valve is normal in structure. Tricuspid valve  regurgitation is mild.  10. The aortic valve is normal in structure. Aortic valve regurgitation is  trivial. No evidence of aortic valve sclerosis or stenosis.  11.  There is Mild calcification of the aortic valve.  12. There is Mild thickening of the aortic valve.  13. The pulmonic valve was normal in structure. Pulmonic valve  regurgitation is not visualized.  14. Moderately elevated pulmonary artery systolic pressure.  15. The inferior vena cava is dilated in size with <50% respiratory  variability, suggesting right atrial pressure of 15 mmHg.   EKG:  EKG is personally reviewed.   05/01/2021: junctional bradycardia, 42 bpm  Recent Labs: 03/14/2021: B Natriuretic Peptide 111.6 03/15/2021: Magnesium 2.1 03/26/2021: ALT 11; BUN 83; Creatinine, Ser 2.44; Hemoglobin 10.0; Platelets 126.0; Potassium 5.1; Pro B Natriuretic peptide (BNP) 168.0; Sodium 140; TSH 0.41  Recent Lipid Panel    Component Value Date/Time   CHOL 141 08/14/2020 1017   CHOL 168 07/25/2020 0000   TRIG 334 (H) 08/14/2020 1017   HDL 27 (L) 08/14/2020 1017   HDL 29 (L) 07/25/2020 0000   CHOLHDL 5.2 (H) 08/14/2020 1017   VLDL 118.2 (H) 09/25/2011 1511   LDLCALC 74 08/14/2020 1017   LDLDIRECT 41.0 05/19/2018 1140    Physical Exam:    VS:  BP (!) 145/40   Pulse (!) 43   Ht _0  (1.549 m)   Wt 224 lb 9.6 oz (101.9 kg)   LMP  (LMP Unknown)   SpO2  98%   BMI 42.44 kg/m     Wt Readings from Last 3 Encounters:  05/01/21 224 lb 9.6 oz (101.9 kg)  04/07/21 219 lb (99.3 kg)  03/26/21 221 lb 1.6 oz (100.3 kg)    GEN: frail appearing, in no acute distress, [place] in place HEENT: Normal, moist mucous membranes NECK: No JVD CARDIAC: bradycardic, regular rhythm, normal S1 and S2, no rubs or gallops. No murmur. VASCULAR: Radial and DP pulses 2+ bilaterally. No carotid bruits RESPIRATORY:  crackles bilateral lower lung fields, trivial. Clear to auscultation without wheezing or rhonchi  ABDOMEN: Soft, non-tender, non-distended MUSCULOSKELETAL:  in a wheelchair, moves all limbs independently SKIN: Warm and dry, bilateral LE edema trace-1+ at knees. Below the knees, legs wrapped bilaterally NEUROLOGIC:  Alert and oriented x 3. No focal neuro deficits noted. PSYCHIATRIC:  Normal affect    ASSESSMENT:    1. Junctional bradycardia   2. Atrial fibrillation, unspecified type ([place])   3. Chronic diastolic CHF (congestive heart failure) ([place])   4. Essential hypertension   5. Chronic hypoxemic respiratory failure (HCC)    PLAN:    Junctional bradycardia History of atrial fibrillation/atrial flutter, with tachycardia -this is new. Prior heart rates, especially during hospitalization and recent follow up, documented as 60s-90s. I am very concerned that she may have a tachy-brady picture given her history of elevated heart rates. Now is not tolerating 25 mg metoprolol succinate, last dose 8/24 AM. -stop metoprolol -will place live telemetry Zio monitor. Instructed her that she may be called about low heart rates/pauses -will send a message to Dr. Rayann Heman. She may require pacemaker to be able to treat her paroxysmal tachycardia, defer to his expertise -she is hemodynamically stable at this time. Did discuss ER evaluation, but as she is stable we will try to avoid. Did counsel her and her daughter extensively on red flag warning signs that need immediate  medical attention -had recent labs checked with Dr. Joelyn Oms, will see if we can request these from the office -CHA2DS2/VAS Stroke Risk Points= at least 7. Reviewed prior discussions re: risk/benefit of anticoagulation. With chronic thrombocytopenia and history of GI bleeding, decision made previously to not anticoagulate.  Chronic diastolic heart failure, with recent admission for acute on chronic diastolic heart failure Chronic venous stasis Chronic hypoxic respiratory failure, now on home O2 -likely will be exacerbated by bradycardia -bilateral LE wrapped, with trace-1+ pitting edema at bilateral knees -with chronic kidney disease stage 4, GFR too low for SLGT2i -recently changed from furosemide to torsemide, notes good urine output on this -continue compression, elevation -discussed monitoring weights -has venous ultrasound scheduled for tomorrow  Hypertension; well controlled on 7.5 mg amlodipine. Has history of hypertensive urgency. Hypotension is a red flag to go to ER if she has severe bradycardia, but she has not had issues with this to date  Type II diabetes, on insulin: GFR too low for SGLT2i -on atorvastatin -no aspirin given history of bleeding and thrombocytopenia  Plan for follow up: 3-4 weeks or sooner as needed.   Total time of encounter: 79 minutes total time of encounter, including 48 minutes spent in face-to-face patient care. This time includes coordination of care and counseling regarding complex history, risk of bradycardia and red flag signs. Remainder of non-face-to-face time involved reviewing chart documents/testing relevant to the patient encounter and documentation in the medical record.  Buford Dresser, MD, PhD, [facility]    Medication Adjustments/Labs and Tests Ordered: Current medicines are reviewed at length with the patient today.  Concerns regarding medicines are outlined above.   Orders Placed This Encounter   Procedures   LONG TERM MONITOR-LIVE TELEMETRY (3-14 DAYS)   EKG 12-Lead   No orders of the defined types were placed in this encounter.  Patient Instructions  Medication Instructions:  STOP METOPROLOL  Keep taking the torsemide 40 mg daily. I will reach out to Dr. Joelyn Oms to get your recent labs.  *If you need a refill on your cardiac medications before your next appointment, please call your pharmacy*   Lab Work: None ordered today   Testing/Procedures: We will place a monitor today, with live telemetry Any loss of consciousness needs 911 Lightheadedness/dizziness with low blood pressure needs to come to the ER.  ZIO AT Long term monitor-Live Telemetry  Your physician has requested you wear a ZIO patch monitor for 7 days.  This is a single patch monitor. [employer] supplies one patch monitor per enrollment. Additional  stickers are not available.  Please do not apply patch if you will be having a Nuclear Stress Test, Echocardiogram, Cardiac CT, MRI,  or Chest Xray during the period you would be wearing the monitor. The patch cannot be worn during  these tests. You cannot remove and re-apply the ZIO AT patch monitor.  Your ZIO patch monitor will be mailed 3 day USPS to your address on file. It may take 3-5 days to  receive your monitor after you have been enrolled.  Once you have received your monitor, please review the enclosed instructions. Your monitor has  already been registered assigning a specific monitor serial # to you.   Billing and Patient Assistance Program information  Theodore Demark has been supplied with any insurance information on record for billing. [employer] offers a sliding scale Patient Assistance Program for patients without insurance, or whose  insurance does not completely cover the cost of the ZIO patch monitor. You must apply for the  Patient Assistance Program to qualify for the discounted rate. To apply, call [employer] at [phone],  select option 4,  select option 2 , ask to apply for the Patient Assistance Program, (you can request an  interpreter if needed). [employer]  will ask your household income and how many people are in your  household. [employer] will quote your out-of-pocket cost based on this information. They will also be able  to set up a 12 month interest free payment plan if needed.  Applying the monitor   Shave hair from upper left chest.  Hold the abrader disc by orange tab. Rub the abrader in 40 strokes over left upper chest as indicated in  your monitor instructions.  Clean area with 4 enclosed alcohol pads. Use all pads to ensure the area is cleaned thoroughly. Let  dry.  Apply patch as indicated in monitor instructions. Patch will be placed under collarbone on left side of  chest with arrow pointing upward.  Rub patch adhesive wings for 2 minutes. Remove the white label marked "1". Remove the white label  marked "2". Rub patch adhesive wings for 2 additional minutes.  While looking in a mirror, press and release button in center of patch. A small green light will flash 3-4  times. This will be your only indicator that the monitor has been turned on.  Do not shower for the first 24 hours. You may shower after the first 24 hours.  Press the button if you feel a symptom. You will hear a small click. Record Date, Time and Symptom in  the Patient Log.   Starting the Gateway  In your kit there is a Hydrographic surveyor box the size of a cellphone. This is Airline pilot. It transmits all your  recorded data to [facility]. This box must always stay within 10 feet of you. Open the box and push the *  button. There will be a light that blinks orange and then green a few times. When the light stops  blinking, the Gateway is connected to the ZIO patch. Call [employer] at [phone] to confirm your monitor is transmitting.  Returning your monitor  Remove your patch and place it inside the [place]. In the lower half of the Gateway there is a  white  bag with prepaid postage on it. Place Gateway in bag and seal. Mail package back to [place] as soon as  possible. Your physician should have your final report approximately 7 days after you have mailed back  your monitor. Call [place] at [phone] if you have questions regarding your ZIO AT  patch monitor. Call them immediately if you see an orange light blinking on your monitor.  If your monitor falls off in less than 4 days, contact our Monitor department at [phone]. If your  monitor becomes loose or falls off after 4 days call [employer] at [phone] for suggestions on  securing your monitor    Follow-Up: At [facility], you and your health needs are our priority.  As part of our continuing mission to provide you with exceptional heart care, we have created designated Provider Care Teams.  These Care Teams include your primary Cardiologist (physician) and Advanced Practice Providers (APPs -  Physician Assistants and Nurse Practitioners) who all work together to provide you with the care you need, when you need it.  We recommend signing up for the patient portal called "MyChart".  Sign up information is provided on this After Visit Summary.  MyChart is used to connect with patients for Virtual Visits (Telemedicine).  Patients are able to view lab/test results, encounter notes, upcoming appointments, etc.  Non-urgent messages can be sent to your provider as well.   To learn more about what you can do  with MyChart, go to [email or web address].    Your next appointment:   3-4 week(s)  The format for your next appointment:   In Person  Provider:   Buford Dresser, MD     [facility],acting as a scribe for Buford Dresser, MD.,have documented all relevant documentation on the behalf of Buford Dresser, MD,as directed by  Buford Dresser, MD while in the presence of Buford Dresser, MD.  I, Buford Dresser, MD, have reviewed all documentation for this visit. The documentation on 05/01/21 for the exam, diagnosis, procedures, and orders are all accurate and complete.   Signed, Buford Dresser, MD PhD 05/01/2021 6:34 PM    [place]

## 2021-05-02 ENCOUNTER — Ambulatory Visit (HOSPITAL_COMMUNITY)
Admission: RE | Admit: 2021-05-02 | Discharge: 2021-05-02 | Disposition: A | Discharge status: Home / Self Care | Payer: Medicare Other | Source: Ambulatory Visit | Attending: Vascular Surgery | Admitting: Vascular Surgery

## 2021-05-02 ENCOUNTER — Ambulatory Visit (INDEPENDENT_AMBULATORY_CARE_PROVIDER_SITE_OTHER): Payer: Medicare Other | Admitting: Physician Assistant

## 2021-05-02 VITALS — BP 133/43 | HR 46 | Temp 98.6°F | Resp 20 | Ht 61.0 in | Wt 220.0 lb

## 2021-05-02 DIAGNOSIS — I872 Venous insufficiency (chronic) (peripheral): Secondary | ICD-10-CM | POA: Insufficient documentation

## 2021-05-02 DIAGNOSIS — M7989 Other specified soft tissue disorders: Secondary | ICD-10-CM

## 2021-05-02 DIAGNOSIS — R001 Bradycardia, unspecified: Secondary | ICD-10-CM | POA: Diagnosis not present

## 2021-05-02 NOTE — Addendum Note (Signed)
Addended by: Meryl Crutch on: 05/02/2021 08:08 AM   Modules accepted: Orders

## 2021-05-02 NOTE — Progress Notes (Signed)
Requested by:  Caren Macadam, MD [place],  [place] [id number]  Reason for consultation: venous insufficiency    History of Present Illness   [name] is a 86 y.o. (Jun 11, 1935) female who presents for evaluation of venous insufficiency. She says she has had swelling in her legs for many years. She does not really have any pain. She has had issues mostly with darkening of her skin, blistering and redness on her legs. She was referred to the wound care center but since she did not have open wounds at the time so they did not treat her for anything. However they got her knee high compression stockings and compression wraps, which she has been using since April. She has had improvement with compression and feels they make her legs feel better. She additionally was started on a different fluid pill last week by her Nephrologist and this has helped a lot with reducing her swelling. She says she does not ambulate much. She does do piliates several times a week and chair yoga. She does not  have history of DVT but does have history of multiple PE's. This was believed to be secondary to taking hormone therapy. She does not have any family history of venous disease.   She is currently seeing cardiologist Dr Harrell Gave. She is currently using a ZIO telemetry monitor due to episodes of atrial flutter. She is on home O2 which she uses as needed. She additionally has CKD IV  Venous symptoms include: swelling, redness, "water blisters" Onset/duration:  > 5 years Occupation:  retired Aggravating factors: (sitting, standing) Alleviating factors: (elevation) Compression:  knee high Helps:  yes Pain medications:  none Previous vein procedures:  none History of DVT:  PE from taking hormone therapy   Past Medical History:  Diagnosis Date   Allergic rhinitis    Anxiety state, unspecified    panic attacks   CHF (congestive heart failure) (HCC)    Depressive disorder,  not elsewhere classified    Extrinsic asthma, unspecified    no problem since adulthood   History of complications due to general anesthesia 03/03/2021   Hypertensive urgency 01/30/2021   Obesity    OSA on CPAP    severe   Pneumonitis 07/14/2019   Pressure injury of skin 07/10/2019   Pulmonary embolism ([facility])    Pure hypercholesterolemia    Respiratory failure with hypoxia ([place]) 09/2008   acute, secondary to multiple bilateral pulmonary embolism , negative hypercoagulable workup 09/2008 hospital stay   Scoliosis    Type II or unspecified type diabetes mellitus without mention of complication, not stated as uncontrolled    Unspecified hypothyroidism    hypo    Past Surgical History:  Procedure Laterality Date   CARDIOVERSION N/A 06/26/2019   Procedure: CARDIOVERSION;  Surgeon: Jerline Pain, MD;  Location: [facility];  Service: Cardiovascular;  Laterality: N/A;   EYE SURGERY Bilateral 2005   cataracts, implants   KIDNEY STONE SURGERY Bilateral    2016, 2017    knee replaced Left 2013   THYROID SURGERY  1966   nodule removal; partial thyroidectomy; radioactive iodine x2; 1990's    Social History   Socioeconomic History   Marital status: Widowed    Spouse name: Not on file   Number of children: 2   Years of education: Not on file   Highest education level: Not on file  Occupational History   Occupation: OWNER    Employer: [employer]    Comment:  Self employed- runs temp staffing agency  Tobacco Use   Smoking status: Former    Packs/day: 0.25    Years: 20.00    Pack years: 5.00    Types: Cigarettes    Start date: 81    Quit date: 09/07/1989    Years since quitting: 31.6   Smokeless tobacco: Never  Vaping Use   Vaping Use: Never used  Substance and Sexual Activity   Alcohol use: No   Drug use: No   Sexual activity: Not Currently  Other Topics Concern   Not on file  Social History Narrative   Lives at [facility] Strain: Low Risk    Difficulty of Paying Living Expenses: Not hard at all  Food Insecurity: No Food Insecurity   Worried About Charity fundraiser in the Last Year: Never true   Arboriculturist in the Last Year: Never true  Transportation Needs: No Transportation Needs   Lack of Transportation (Medical): No   Lack of Transportation (Non-Medical): No  Physical Activity: Inactive   Days of Exercise per Week: 0 days   Minutes of Exercise per Session: 0 min  Stress: No Stress Concern Present   Feeling of Stress : Not at all  Social Connections: Socially Isolated   Frequency of Communication with Friends and Family: Three times a week   Frequency of Social Gatherings with Friends and Family: Three times a week   Attends Religious Services: Never   Active Member of Clubs or Organizations: No   Attends Archivist Meetings: Never   Marital Status: Widowed  Human resources officer Violence: Not At Risk   Fear of Current or Ex-Partner: No   Emotionally Abused: No   Physically Abused: No   Sexually Abused: No    Family History  Problem Relation Age of Onset   Hypertension Father    Hyperlipidemia Father    Allergies Mother    Clotting disorder Mother    Osteoarthritis Mother    Asthma Mother    Hypertension Mother    Stroke Mother    Coronary artery disease Other    Stroke Other    Rheum arthritis Maternal Grandmother    Clotting disorder Maternal Grandmother    Clotting disorder Maternal Uncle    Arthritis Daughter    Diabetes Daughter    High blood pressure Daughter    High Cholesterol Daughter    Depression Maternal Grandfather    Diabetes Paternal Grandmother    Breast cancer Neg Hx     Current Outpatient Medications  Medication Sig Dispense Refill   acetaminophen (TYLENOL) 325 MG tablet Take 650 mg by mouth in the morning and at bedtime.     amLODipine (NORVASC) 2.5 MG tablet Take 3 tablets (7.5 mg total) by mouth daily. 270 tablet 1   amoxicillin (AMOXIL)  500 MG tablet Take 2,000 mg by mouth as needed. Prior to Dental Appt     atorvastatin (LIPITOR) 40 MG tablet Take 1 tablet (40 mg total) by mouth at bedtime. 90 tablet 0   BD PEN NEEDLE NANO 2ND GEN 32G X 4 MM MISC USE TO INJECT UP TO FOUR TIMES DAILY AS DIRECTED 200 each 3   bisacodyl (DULCOLAX) 5 MG EC tablet Take 10 mg by mouth every 3 (three) days as needed (for constipation).     Bismuth Tribromoph-Petrolatum (XEROFORM PETROLAT PATCH 4"X4") PADS Apply 1 Units topically daily as needed. 75 each 5  Cholecalciferol (VITAMIN D3) 2000 units TABS Take 2,000 Units by mouth daily.     Continuous Blood Gluc Sensor (FREESTYLE LIBRE 14 DAY SENSOR) MISC APPLY NEW SENSOR EVERY 14 DAYS AS DIRECTED 6 each 3   fexofenadine (ALLEGRA) 180 MG tablet Take 180 mg by mouth daily.     hyoscyamine (LEVSIN) 0.125 MG tablet Take 1 tablet by mouth daily.     insulin glargine (LANTUS SOLOSTAR) 100 UNIT/ML Solostar Pen Inject 46 Units into the skin at bedtime.     insulin lispro (HUMALOG KWIKPEN) 100 UNIT/ML KwikPen INJECT 13 TO 20 UNITS UNDER THE SKIN THREE TIMES DAILY WITH BREAKFAST, LUNCH AND EVENING MEAL 30 mL 1   Insulin Syringe-Needle U-100 (INSULIN SYRINGE .3CC/29GX1/2") 29G X 1/2" 0.3 ML MISC Use to inject insulin 4 times a day. 400 each 3   levothyroxine (SYNTHROID) 112 MCG tablet TAKE 1 TABLET(112 MCG) BY MOUTH DAILY 90 tablet 1   Multiple Vitamin (MULTIVITAMIN) tablet Take 1 tablet by mouth daily.     sertraline (ZOLOFT) 50 MG tablet TAKE 1 TABLET BY MOUTH AT BEDTIME 90 tablet 1   torsemide (DEMADEX) 20 MG tablet Take 40 mg by mouth every morning.     vitamin B-12 (CYANOCOBALAMIN) 100 MCG tablet Take 100 mcg by mouth daily.     vitamin C (ASCORBIC ACID) 250 MG tablet Take 250 mg by mouth daily.     No current facility-administered medications for this visit.    Allergies  Allergen Reactions   Adhesive [Tape] Other (See Comments)    PULLS OFF THE SKIN   Apixaban Other (See Comments)    Internal  Bleeding   Aspirin Itching, Rash, Hives and Swelling    Swelling of her tongue   Mirabegron Other (See Comments)    Patient experienced A-Fib   Pineapple Anaphylaxis and Swelling    Throat swells and blisters on tongue and roof of mouth per patient   Metformin Diarrhea and Nausea Only   Tetracycline Hives   Fluticasone-Salmeterol Itching and Rash   Iodinated Diagnostic Agents Itching and Rash        Lactose Intolerance (Gi) Other (See Comments)    Gas    Latex Itching, Rash and Other (See Comments)    Pulls off the skin and causes welts   Lisinopril Cough   Metronidazole Other (See Comments)    Reaction not known   Sulfa Antibiotics Rash   Sulfonamide Derivatives Itching and Rash    REVIEW OF SYSTEMS (negative unless checked):   Cardiac:  []  Chest pain or chest pressure? []  Shortness of breath upon activity? []  Shortness of breath when lying flat? []  Irregular heart rhythm?  Vascular:  []  Pain in calf, thigh, or hip brought on by walking? []  Pain in feet at night that wakes you up from your sleep? []  Blood clot in your veins? []  Leg swelling?  Pulmonary:  []  Oxygen at home? []  Productive cough? []  Wheezing?  Neurologic:  []  Sudden weakness in arms or legs? []  Sudden numbness in arms or legs? []  Sudden onset of difficult speaking or slurred speech? []  Temporary loss of vision in one eye? []  Problems with dizziness?  Gastrointestinal:  []  Blood in stool? []  Vomited blood?  Genitourinary:  []  Burning when urinating? []  Blood in urine?  Psychiatric:  []  Major depression  Hematologic:  []  Bleeding problems? []  Problems with blood clotting?  Dermatologic:  []  Rashes or ulcers?  Constitutional:  []  Fever or chills?  Ear/Nose/Throat:  []  Change in hearing? []   Nose bleeds? []  Sore throat?  Musculoskeletal:  []  Back pain? []  Joint pain? []  Muscle pain?   Physical Examination     Vitals:   05/02/21 1457  BP: (!) 133/43  Pulse: (!) 46   Resp: 20  Temp: 98.6 F (37 C)  TempSrc: Temporal  SpO2: 100%  Weight: 220 lb (99.8 kg)  Height: 5\' 1"  (1.549 m)   Body mass index is 41.57 kg/m.  General:  WDWN in NAD; vital signs documented above Gait: Not observed HENT: WNL, normocephalic Pulmonary: normal non-labored breathing , without Rales, rhonchi,  wheezing Cardiac: regular HR, without  Murmurs without carotid bruit Abdomen: obese, soft, NT, no masses Vascular Exam/Pulses:  Right Left  Radial 2+ (normal) 2+ (normal)  Femoral 2+ (normal) 2+ (normal)  Popliteal 2+ (normal) 2+ (normal)  DP 2+ (normal) 2+ (normal)  PT 2+ (normal) 2+ (normal)   Extremities: without varicose veins, without reticular veins, with edema, with stasis pigmentation BLE, with lipodermatosclerosis BLE, with papillomatosis BLE, without ulcers Musculoskeletal: no muscle wasting or atrophy  Neurologic: A&O X 3;  No focal weakness or paresthesias are detected Psychiatric:  The pt has Normal affect.  Non-invasive Vascular Imaging   BLE Venous Insufficiency Duplex (05/02/21):   LLE: No DVT and SVT,  GSV reflux at [facility] and proximal thigh GSV diameter 0.35-0.598 No SSV reflux  CFV and popliteal deep venous reflux   Medical Decision Making   Danira Nylander is a 86 y.o. female who presents with: LLE chronic venous insufficiency with swelling. Her duplex today shows no DVT or SVT. She does have both deep and superficial venous insufficiency. Her vein is of adequate size to be considered for venous lazer ablation. I do think likely some of her edema is a combination of her CHF, CKD stage IV and her venous insufficiency Based on the patient's history and examination, I recommend compression stockings, elevation and increasing exercise. I discussed with patient and her daughter that increased walking or exercise regimen would be very helpful  I discussed with the patient the use of her 20-30 mm thigh high compression stockings and need for 3 month  trial of such. The patient will follow up in 3 months with Dr. Scot Dock Thank you for allowing [place] to participate in this patient's care.   Karoline Caldwell, PA-C [facility] Office: [phone]  05/02/2021, 3:50 PM  Clinic MD: Donzetta Matters

## 2021-05-05 ENCOUNTER — Other Ambulatory Visit: Payer: Self-pay

## 2021-05-05 ENCOUNTER — Ambulatory Visit (INDEPENDENT_AMBULATORY_CARE_PROVIDER_SITE_OTHER): Payer: Medicare Other

## 2021-05-05 ENCOUNTER — Encounter: Payer: Self-pay | Admitting: Primary Care

## 2021-05-05 ENCOUNTER — Ambulatory Visit (INDEPENDENT_AMBULATORY_CARE_PROVIDER_SITE_OTHER): Payer: Medicare Other | Admitting: Primary Care

## 2021-05-05 DIAGNOSIS — J90 Pleural effusion, not elsewhere classified: Secondary | ICD-10-CM | POA: Diagnosis not present

## 2021-05-05 DIAGNOSIS — G4733 Obstructive sleep apnea (adult) (pediatric): Secondary | ICD-10-CM

## 2021-05-05 DIAGNOSIS — I517 Cardiomegaly: Secondary | ICD-10-CM | POA: Diagnosis not present

## 2021-05-05 DIAGNOSIS — R0600 Dyspnea, unspecified: Secondary | ICD-10-CM | POA: Diagnosis not present

## 2021-05-05 DIAGNOSIS — J9621 Acute and chronic respiratory failure with hypoxia: Secondary | ICD-10-CM | POA: Diagnosis not present

## 2021-05-05 NOTE — Assessment & Plan Note (Addendum)
-   NPSG 04/2008 showed severe OSA, AHI 85/hr. She has not used CPAP >1 year. She underwent CPAP titration study on 04/07/21 that showed an optimal PAP pressure was 12cm h20, she did not require supplemental oxygen. DME order has been placed for new CPAP machine. FU in 4-6 weeks for compliance check.

## 2021-05-05 NOTE — Patient Instructions (Addendum)
Nice seeing you today [name]  Recommendations: - Resume CPAP use once you get new machine, I will discuss your concerns with Dr Elsworth Soho  - Continue to wear supplemental oxygen during the day to ensure >88-90%, we will try and wean you off at next visit if possible   Orders: - CXR today  - New CPAP 12cm h20 (adapt); medium size F&P full face mask, F&P vitera (new)mask and heated humidity   Follow-up: - Dr. Elsworth Soho (October 4th, 5th or 25th)

## 2021-05-05 NOTE — Progress Notes (Signed)
@Patient  ID: [name], female    DOB: 05-21-1935, 86 y.o.   MRN: [id number]  Chief Complaint  Patient presents with   Follow-up    Patient reports she is some better, she reports heart doc and feels that her heart is pumping better now.     Referring provider: Caren Macadam, MD  HPI: 86 year old female, former smoker quit in 1991. PMH significant for HTN, heart failure, paroxysmal afib, asthma, respiratory failure, dysphagia, severe sleep apnea, type 2 DM, stage 4 kidney disease, chronic ITP, obesity.  Patient of Dr. Elsworth Soho, last seen on 03/18/21.   Previous [facility] pulmonary encounter: 03/18/21 HPI   86 yo ex-smoker for FU of  severe obstructive sleep apnea on nocturnal CPAP   Critical illness with pneumonitis, acute hypoxic respiratory failure, prolonged hospitalization 07/2019, was DNR, improved with BiPAP and steroids, required prolonged rehab but now is back on her feet and living independently.   PMH -  chronic kidney disease, ITP, A. fib and history of PE in 2010    O2 saturation dropped to 70% while in A-fib clinic. Pt is currently in ED at [facility]Daughter stated doctor at [place] [facility] looked at CXR and noted bilateral pleural effusions and "fluid around lungs"   She was admitted to the hospital for acute exacerbation of chronic diastolic CHF and acute hypoxic respiratory failure.  She was treated with IV Lasix 80 every 12 her condition improved.  Unfortunately, she could not be weaned off of oxygen.  Oxygen saturation on room air at rest was 94%, oxygen saturation on room air while ambulating was 84% on oxygen saturation on 2 L of oxygen while ambulating was 90%.  2 L/min oxygen has been prescribed She was discharged from the hospital today and arrives straight from the hospital   BUN/creatinine 03/1271/2.2 Discharge weight was 224 pounds   We reviewed home sleep test which showed moderate OSA and significant drop in O2 levels and saturation study was  scheduled   She arrives in a wheelchair accompanied by her daughter who corroborates history     05/05/2021- Interim hx  Patient presents today for overdue follow-up. She was last seen by Dr. Elsworth Soho in July 2022 and recommended to return in 2 weeks with CXR. During that visit he felt her respiratory failure was due to fluid retention and he increased her lasix to 40mg  daily. She was also instructed to stay on oxygen until CPAP titration study. Home sleep study showed significant desaturations. She has required CPAP in the past. ABGs showed chronic compensated hypercarbia. She completed her CPAP titration study in early August 2022 which showed optimal pressure 12cm h20. She did not require supplemental oxygen at night.  She is doing alright today. She has not used CPAP in >1 year. She feels a majority of her lung programs came from using a contaminated CPAP machine. She states that her type of resmed machine was recalled but not her particular serial number. She is in the processes of speaking with the company about this. She has fears about wearing CPAP due to this. She has a new machine available with [employer] [facility] but will have to pay out of pocket as her machine is <62 year old. She was recently taken off metoprolol d/t bradycardia.    Significant tests/ events reviewed HST 02/2021 showed mod OSA with AHI 15/ hr    NPSG in 04/2008 showed severe obstructive sleep apnea with AHI 85/h, TST 121 mins - no REM sleep. Subsequent  titration study showed CPAP requirement of 15 cm with small comfort gel mask   07/2019 ABG 7.4 5/61/54  01/2021 VQ scan negative  04/07/21 CPAP titration study- Optimal PAP pressure 12cm h20. Min O2 at optimal pressure 90%. AHI at optimal pressure 0.3/hr.  Patient completed study in recliner and was sitting upright for majority of the study    Allergies  Allergen Reactions   Adhesive [Tape] Other (See Comments)    PULLS OFF THE SKIN   Apixaban Other (See Comments)    Internal  Bleeding   Aspirin Itching, Rash, Hives and Swelling    Swelling of her tongue   Mirabegron Other (See Comments)    Patient experienced A-Fib   Pineapple Anaphylaxis and Swelling    Throat swells and blisters on tongue and roof of mouth per patient   Metformin Diarrhea and Nausea Only   Tetracycline Hives   Fluticasone-Salmeterol Itching and Rash   Iodinated Diagnostic Agents Itching and Rash        Lactose Intolerance (Gi) Other (See Comments)    Gas    Latex Itching, Rash and Other (See Comments)    Pulls off the skin and causes welts   Lisinopril Cough   Metronidazole Other (See Comments)    Reaction not known   Sulfa Antibiotics Rash   Sulfonamide Derivatives Itching and Rash    Immunization History  Administered Date(s) Administered   Fluad Quad(high Dose 65+) 05/27/2020   Influenza Split 06/09/2011   Influenza Whole 07/23/2008, 06/13/2009, 06/07/2012   Influenza, High Dose Seasonal PF 05/08/2015, 05/19/2018   Moderna Sars-Covid-2 Vaccination 09/18/2019, 10/16/2019, 09/10/2020   Pneumococcal Conjugate-13 12/15/2016   Pneumococcal Polysaccharide-23 01/30/2008   Pneumococcal-Unspecified 09/07/2013   Td 01/25/2006   Zoster, Live 06/09/2011    Past Medical History:  Diagnosis Date   Allergic rhinitis    Anxiety state, unspecified    panic attacks   CHF (congestive heart failure) (HCC)    Depressive disorder, not elsewhere classified    Extrinsic asthma, unspecified    no problem since adulthood   History of complications due to general anesthesia 03/03/2021   Hypertensive urgency 01/30/2021   Obesity    OSA on CPAP    severe   Pneumonitis 07/14/2019   Pressure injury of skin 07/10/2019   Pulmonary embolism ([place])    Pure hypercholesterolemia    Respiratory failure with hypoxia ([place]) 09/2008   acute, secondary to multiple bilateral pulmonary embolism , negative hypercoagulable workup 09/2008 hospital stay   Scoliosis    Type II or unspecified type diabetes  mellitus without mention of complication, not stated as uncontrolled    Unspecified hypothyroidism    hypo    Tobacco History: Social History   Tobacco Use  Smoking Status Former   Packs/day: 0.25   Years: 20.00   Pack years: 5.00   Types: Cigarettes   Start date: 44   Quit date: 09/07/1989   Years since quitting: 31.6  Smokeless Tobacco Never   Counseling given: Not Answered   Outpatient Medications Prior to Visit  Medication Sig Dispense Refill   acetaminophen (TYLENOL) 325 MG tablet Take 650 mg by mouth in the morning and at bedtime.     amLODipine (NORVASC) 2.5 MG tablet Take 3 tablets (7.5 mg total) by mouth daily. 270 tablet 1   amoxicillin (AMOXIL) 500 MG tablet Take 2,000 mg by mouth as needed. Prior to Dental Appt     atorvastatin (LIPITOR) 40 MG tablet Take 1 tablet (40 mg total) by mouth  at bedtime. 90 tablet 0   BD PEN NEEDLE NANO 2ND GEN 32G X 4 MM MISC USE TO INJECT UP TO FOUR TIMES DAILY AS DIRECTED 200 each 3   bisacodyl (DULCOLAX) 5 MG EC tablet Take 10 mg by mouth every 3 (three) days as needed (for constipation).     Bismuth Tribromoph-Petrolatum (XEROFORM PETROLAT PATCH 4"X4") PADS Apply 1 Units topically daily as needed. 75 each 5   Cholecalciferol (VITAMIN D3) 2000 units TABS Take 2,000 Units by mouth daily.     Continuous Blood Gluc Sensor (FREESTYLE LIBRE 14 DAY SENSOR) MISC APPLY NEW SENSOR EVERY 14 DAYS AS DIRECTED 6 each 3   fexofenadine (ALLEGRA) 180 MG tablet Take 180 mg by mouth daily.     hyoscyamine (LEVSIN) 0.125 MG tablet Take 1 tablet by mouth daily.     insulin glargine (LANTUS SOLOSTAR) 100 UNIT/ML Solostar Pen Inject 46 Units into the skin at bedtime.     insulin lispro (HUMALOG KWIKPEN) 100 UNIT/ML KwikPen INJECT 13 TO 20 UNITS UNDER THE SKIN THREE TIMES DAILY WITH BREAKFAST, LUNCH AND EVENING MEAL 30 mL 1   Insulin Syringe-Needle U-100 (INSULIN SYRINGE .3CC/29GX1/2") 29G X 1/2" 0.3 ML MISC Use to inject insulin 4 times a day. 400 each 3    levothyroxine (SYNTHROID) 112 MCG tablet TAKE 1 TABLET(112 MCG) BY MOUTH DAILY 90 tablet 1   Multiple Vitamin (MULTIVITAMIN) tablet Take 1 tablet by mouth daily.     sertraline (ZOLOFT) 50 MG tablet TAKE 1 TABLET BY MOUTH AT BEDTIME 90 tablet 1   torsemide (DEMADEX) 20 MG tablet Take 40 mg by mouth every morning.     vitamin B-12 (CYANOCOBALAMIN) 100 MCG tablet Take 100 mcg by mouth daily.     vitamin C (ASCORBIC ACID) 250 MG tablet Take 250 mg by mouth daily.     No facility-administered medications prior to visit.    Review of Systems  Review of Systems  Constitutional:  Positive for fatigue.  HENT: Negative.    Respiratory:  Negative for cough, chest tightness, shortness of breath and wheezing.   Cardiovascular: Negative.        Bradycardia  Psychiatric/Behavioral:  Positive for sleep disturbance.     Physical Exam  BP 130/76 (BP Location: Left Arm, Patient Position: Sitting, Cuff Size: Large)   Pulse (!) 56   Temp 98.9 F (37.2 C) (Oral)   Ht 5\' 1"  (1.549 m)   Wt 219 lb (99.3 kg)   LMP  (LMP Unknown)   SpO2 97%   BMI 41.38 kg/m  Physical Exam Constitutional:      General: She is not in acute distress.    Appearance: Normal appearance. She is not ill-appearing.  HENT:     Head: Normocephalic and atraumatic.     Mouth/Throat:     Comments: Deferred d/t mask Cardiovascular:     Rate and Rhythm: Normal rate and regular rhythm.     Comments: Trace BLE edema Pulmonary:     Effort: Pulmonary effort is normal. No respiratory distress.     Breath sounds: No stridor. Rales present. No wheezing or rhonchi.  Musculoskeletal:     Comments: In WC  Skin:    General: Skin is warm.  Neurological:     General: No focal deficit present.     Mental Status: She is alert and oriented to person, place, and time. Mental status is at baseline.  Psychiatric:        Mood and Affect: Mood normal.  Behavior: Behavior normal.        Thought Content: Thought content normal.         Judgment: Judgment normal.     Lab Results:  CBC    Component Value Date/Time   WBC 4.7 03/26/2021 1424   RBC 3.32 (L) 03/26/2021 1424   HGB 10.0 (L) 03/26/2021 1424   HGB 11.0 (L) 03/13/2021 1206   HGB 11.9 01/17/2021 1601   HGB 11.0 (L) 09/08/2017 1317   HCT 29.7 (L) 03/26/2021 1424   HCT 35.4 01/17/2021 1601   HCT 33.2 (L) 09/08/2017 1317   PLT 126.0 (L) 03/26/2021 1424   PLT 134 (L) 03/13/2021 1206   PLT 138 (L) 01/17/2021 1601   MCV 89.5 03/26/2021 1424   MCV 91 01/17/2021 1601   MCV 99 09/08/2017 1317   MCH 30.2 03/17/2021 0835   MCHC 33.8 03/26/2021 1424   RDW 17.0 (H) 03/26/2021 1424   RDW 16.3 (H) 01/17/2021 1601   RDW 17.1 (H) 09/08/2017 1317   LYMPHSABS 0.9 03/26/2021 1424   LYMPHSABS 1.2 01/17/2021 1601   LYMPHSABS 1.2 09/08/2017 1317   MONOABS 0.4 03/26/2021 1424   EOSABS 0.2 03/26/2021 1424   EOSABS 0.2 01/17/2021 1601   EOSABS 0.3 09/08/2017 1317   BASOSABS 0.0 03/26/2021 1424   BASOSABS 0.0 01/17/2021 1601   BASOSABS 0.0 09/08/2017 1317    BMET    Component Value Date/Time   NA 140 03/26/2021 1424   NA 145 09/08/2017 1317   NA 142 02/25/2017 1013   K 5.1 03/26/2021 1424   K 4.5 09/08/2017 1317   K 3.8 02/25/2017 1013   CL 100 03/26/2021 1424   CL 98 09/08/2017 1317   CO2 30 03/26/2021 1424   CO2 32 09/08/2017 1317   CO2 26 02/25/2017 1013   GLUCOSE 156 (H) 03/26/2021 1424   GLUCOSE 251 (H) 09/08/2017 1317   BUN 83 (H) 03/26/2021 1424   BUN 34 (H) 09/08/2017 1317   BUN 20.1 02/25/2017 1013   CREATININE 2.44 (H) 03/26/2021 1424   CREATININE 2.20 (H) 03/13/2021 1206   CREATININE 1.98 (H) 01/17/2021 1601   CREATININE 1.8 (H) 02/25/2017 1013   CALCIUM 9.5 03/26/2021 1424   CALCIUM 9.3 09/08/2017 1317   CALCIUM 8.7 02/25/2017 1013   GFRNONAA 22 (L) 03/18/2021 0738   GFRNONAA 22 (L) 03/13/2021 1206   GFRNONAA 23 (L) 01/17/2021 1601   GFRAA 26 (L) 01/17/2021 1601    BNP    Component Value Date/Time   BNP 111.6 (H) 03/14/2021 1606     ProBNP    Component Value Date/Time   PROBNP 168.0 (H) 03/26/2021 1424    Imaging: VAS [place] LOWER EXTREMITY VENOUS REFLUX  Result Date: 05/02/2021  Lower Venous Reflux Study Patient Name:  [name]  Date of Exam:   05/02/2021 Medical Rec #: [id number]              Accession #:    [id number] Date of Birth: Jul 28, 1935             Patient Gender: F Patient Age:   62 years Exam Location:  [facility] Procedure:      VAS [place] LOWER EXTREMITY VENOUS REFLUX Referring Phys: Harrell Gave DICKSON --------------------------------------------------------------------------------  Indications: Swelling.  Performing Technologist: Ralene Cork RVT  Examination Guidelines: A complete evaluation includes B-mode imaging, spectral Doppler, color Doppler, and power Doppler as needed of all accessible portions of each vessel. Bilateral testing is considered an integral part of a complete examination.  Limited examinations for reoccurring indications may be performed as noted. The reflux portion of the exam is performed with the patient in reverse Trendelenburg. Significant venous reflux is defined as >500 ms in the superficial venous system, and >1 second in the deep venous system.  +--------------+--------+------+----------+-----------+-----------------------+ LEFT          Reflux  Reflux  Reflux   Diameter  Comments                              No       Yes     Time       cms                            +--------------+--------+------+----------+-----------+-----------------------+ CFV                    yes  >1 second                                    +--------------+--------+------+----------+-----------+-----------------------+ FV mid        no                                                         +--------------+--------+------+----------+-----------+-----------------------+ Popliteal              yes  >1 second                                     +--------------+--------+------+----------+-----------+-----------------------+ GSV at SFJ             yes   >500 ms     0.868                           +--------------+--------+------+----------+-----------+-----------------------+ GSV prox thigh         yes   >500 ms     0.598                           +--------------+--------+------+----------+-----------+-----------------------+ GSV mid thigh no                         0.573   tortuous with side                                                       branch                  +--------------+--------+------+----------+-----------+-----------------------+ GSV dist thighno                         0.493                           +--------------+--------+------+----------+-----------+-----------------------+ GSV at knee  yes   >500 ms     0.523                           +--------------+--------+------+----------+-----------+-----------------------+ GSV prox calf no                         0.353                           +--------------+--------+------+----------+-----------+-----------------------+ SSV Pop Fossa no                         0.308                           +--------------+--------+------+----------+-----------+-----------------------+ SSV prox calf no                         0.205                           +--------------+--------+------+----------+-----------+-----------------------+ SSV mid calf  no                         0.242                           +--------------+--------+------+----------+-----------+-----------------------+   Summary: Left: - No evidence of deep vein thrombosis seen in the left lower extremity, from the common femoral through the popliteal veins. - No evidence of superficial venous thrombosis in the left lower extremity. - Deep vein reflux in the CFV and popliteal vein. - Superficial vein reflux in the SFJ and the GSV.  *See table(s) above for  measurements and observations. Electronically signed by Servando Snare MD on 05/02/2021 at 2:45:22 PM.    Final    Cpap titration  Result Date: 04/07/2021 Rigoberto Noel, MD     04/15/2021 10:54 AM Patient Name: [name], [name] Date: 04/07/2021 Gender: Female D.O.B: September 11, 1934 Age (years): 33 Referring Provider: Kara Mead MD, ABSM Height (inches): 61 Interpreting Physician: Sara Mead MD, ABSM Weight (lbs): 219 RPSGT: Gwenyth Allegra BMI: 41 MRN: [id number] Neck Size: 14.00 <br> <br> CLINICAL INFORMATION The patient is referred for a CPAP titration to treat sleep apnea. Date of  HST: 02/2021 mod OSA with AHI 15/ hr  NPSG in 04/2008 showed severe obstructive sleep apnea with AHI 85/h, TST 121 mins - no REM sleep. Subsequent titration study showed CPAP requirement of 15 cm with small comfort gel mask SLEEP STUDY TECHNIQUE As per the AASM Manual for the Scoring of Sleep and Associated Events v2.3 (April 2016) with a hypopnea requiring 4% desaturations. The channels recorded and monitored were frontal, central and occipital EEG, electrooculogram (EOG), submentalis EMG (chin), nasal and oral airflow, thoracic and abdominal wall motion, anterior tibialis EMG, snore microphone, electrocardiogram, and pulse oximetry. Continuous positive airway pressure (CPAP) was initiated at the beginning of the study and titrated to treat sleep-disordered breathing. MEDICATIONS Medications self-administered by patient taken the night of the study : Lantus Solostar, ZOLOFT TECHNICIAN COMMENTS Comments added by technician: Patient slept in a recliner chair. RESPIRATORY PARAMETERS Optimal PAP Pressure (cm): 12 AHI at Optimal Pressure (/hr): 0.3 Overall Minimal O2 (%): 85.0 Supine % at Optimal Pressure (%): 100 Minimal O2 at Optimal Pressure (%):  90.0 SLEEP ARCHITECTURE The study was initiated at 10:56:38 PM and ended at 4:58:00 AM. Sleep onset time was 20.8 minutes and the sleep efficiency was 77.6%%. The total sleep time was 280.5  minutes. The patient spent 8.0%% of the night in stage N1 sleep, 71.7%% in stage N2 sleep, 0.0%% in stage N3 and 20.3% in REM.Stage REM latency was 125.0 minutes Wake after sleep onset was 60.1. Alpha intrusion was absent. Supine sleep was 100.00%. CARDIAC DATA The 2 lead EKG demonstrated sinus rhythm. The mean heart rate was N/A beats per minute. Other EKG findings include: Atrial Fibrillation. LEG MOVEMENT DATA The total Periodic Limb Movements of Sleep (PLMS) were 0. The PLMS index was 0.0. A PLMS index of <15 is considered normal in adults. IMPRESSIONS - The optimal PAP pressure was 12 cm of water.  Patientcompletedthe study in a reclinerand was sittinguprightfor a majorityof itShe slept in a recliner for most of the night. - Moderate oxygen desaturations were observed during this titration (min O2 = 85.0%). - The patient snored with moderate snoring volume during this titration study. - 2-lead EKG demonstrated: Atrial Fibrillation - Clinically significant periodic limb movements were not noted during this study. Arousals associated with PLMs were rare. DIAGNOSIS - Obstructive Sleep Apnea (G47.33) RECOMMENDATIONS - Trial of CPAP therapy on 12 cm H2O with a Medium size Fisher&Paykel Full Face Mask F&P Vitera (new) mask and heated humidification. - Avoid alcohol, sedatives and other CNS depressants that may worsen sleep apnea and disrupt normal sleep architecture. - Sleep hygiene should be reviewed to assess factors that may improve sleep quality. - Weight management and regular exercise should be initiated or continued. - Return to [facility] Center for re-evaluation after 4 weeks of therapy Sara Mead MD Board Certified in [place]  Result Date: 04/09/2021 Ordered by an unspecified provider.    Assessment & Plan:   Sleep apnea - NPSG 04/2008 showed severe OSA, AHI 85/hr. She has not used CPAP >1 year. She underwent CPAP titration study on 04/07/21 that showed an optimal PAP  pressure was 12cm h20, she did not require supplemental oxygen. DME order has been placed for new CPAP machine. FU in 4-6 weeks for compliance check.   Acute on chronic respiratory failure with hypoxia [facility]) - Patient was hospitalized in 2020 for pneumonitis and respiratory failure. She had a prolonged hospital and rehab course. She still has a significant amount of leg weakness and is in a wheelchair today. She feels her hospitalization and respiratory complications were due to "contaminated" Resmed CPAP machine and would like to potentially address this further. She had CXR today, result are pending. O2 97% 2L at rest, recommend checking ambulatory O2 saturation after resuming CPAP during next visit. She can taper O2 during the day to maintain level >88-90%.   40 mins spent, >50% face to face  Martyn Ehrich, NP 05/05/2021

## 2021-05-05 NOTE — Assessment & Plan Note (Addendum)
-   Patient was hospitalized in 2020 for pneumonitis and respiratory failure. She had a prolonged hospital and rehab course. She still has a significant amount of leg weakness and is in a wheelchair today. She feels her hospitalization and respiratory complications were due to "contaminated" Resmed CPAP machine and would like to potentially address this further. She had CXR today, result are pending. O2 97% 2L at rest, recommend checking ambulatory O2 saturation after resuming CPAP during next visit. She can taper O2 during the day to maintain level >88-90%.

## 2021-05-06 NOTE — Telephone Encounter (Signed)
EW please advise. Thanks  

## 2021-05-07 NOTE — Telephone Encounter (Signed)
Please let patient know her serial number was on on recalled list. Recommend you continue to follow-up with DME company regarding this. We encourage she resume  CPAP with new machine from [employer].

## 2021-05-07 NOTE — Progress Notes (Signed)
Please let patient know CXR showed small right pleural effusion, recommend she take an extra torsemide tablet for 3 days. She needs to follow-up with Pcp regarding her diuretic. Also I spoke with Dr. Elsworth Soho and he does not feel CPAP would have caused her pneumonitis back in 2020.

## 2021-05-08 ENCOUNTER — Inpatient Hospital Stay: Payer: Medicare Other | Attending: Hematology & Oncology

## 2021-05-08 ENCOUNTER — Inpatient Hospital Stay (HOSPITAL_BASED_OUTPATIENT_CLINIC_OR_DEPARTMENT_OTHER): Payer: Medicare Other | Admitting: Hematology & Oncology

## 2021-05-08 ENCOUNTER — Telehealth: Payer: Self-pay

## 2021-05-08 ENCOUNTER — Inpatient Hospital Stay: Payer: Medicare Other

## 2021-05-08 ENCOUNTER — Other Ambulatory Visit: Payer: Self-pay

## 2021-05-08 ENCOUNTER — Encounter: Payer: Self-pay | Admitting: Hematology & Oncology

## 2021-05-08 VITALS — BP 167/42 | HR 63 | Temp 98.6°F | Resp 16 | Wt 217.0 lb

## 2021-05-08 DIAGNOSIS — D693 Immune thrombocytopenic purpura: Secondary | ICD-10-CM

## 2021-05-08 DIAGNOSIS — Z9981 Dependence on supplemental oxygen: Secondary | ICD-10-CM | POA: Diagnosis not present

## 2021-05-08 DIAGNOSIS — D509 Iron deficiency anemia, unspecified: Secondary | ICD-10-CM | POA: Insufficient documentation

## 2021-05-08 LAB — CMP (CANCER CENTER ONLY)
ALT: 16 U/L (ref 0–44)
AST: 16 U/L (ref 15–41)
Albumin: 3.9 g/dL (ref 3.5–5.0)
Alkaline Phosphatase: 61 U/L (ref 38–126)
Anion gap: 8 (ref 5–15)
BUN: 68 mg/dL — ABNORMAL HIGH (ref 8–23)
CO2: 30 mmol/L (ref 22–32)
Calcium: 9.5 mg/dL (ref 8.9–10.3)
Chloride: 101 mmol/L (ref 98–111)
Creatinine: 2.49 mg/dL — ABNORMAL HIGH (ref 0.44–1.00)
GFR, Estimated: 19 mL/min — ABNORMAL LOW (ref >60)
Glucose, Bld: 232 mg/dL — ABNORMAL HIGH (ref 70–99)
Potassium: 5.3 mmol/L — ABNORMAL HIGH (ref 3.5–5.1)
Sodium: 139 mmol/L (ref 135–145)
Total Bilirubin: 0.5 mg/dL (ref 0.3–1.2)
Total Protein: 7.1 g/dL (ref 6.5–8.1)

## 2021-05-08 LAB — RETICULOCYTES
Immature Retic Fract: 15.7 % (ref 2.3–15.9)
RBC.: 3.29 MIL/uL — ABNORMAL LOW (ref 3.87–5.11)
Retic Count, Absolute: 69.4 10*3/uL (ref 19.0–186.0)
Retic Ct Pct: 2.1 % (ref 0.4–3.1)

## 2021-05-08 LAB — CBC WITH DIFFERENTIAL (CANCER CENTER ONLY)
Abs Immature Granulocytes: 0.04 10*3/uL (ref 0.00–0.07)
Basophils Absolute: 0 10*3/uL (ref 0.0–0.1)
Basophils Relative: 0 %
Eosinophils Absolute: 0.2 10*3/uL (ref 0.0–0.5)
Eosinophils Relative: 3 %
HCT: 31.5 % — ABNORMAL LOW (ref 36.0–46.0)
Hemoglobin: 10 g/dL — ABNORMAL LOW (ref 12.0–15.0)
Immature Granulocytes: 1 %
Lymphocytes Relative: 17 %
Lymphs Abs: 1 10*3/uL (ref 0.7–4.0)
MCH: 30.4 pg (ref 26.0–34.0)
MCHC: 31.7 g/dL (ref 30.0–36.0)
MCV: 95.7 fL (ref 80.0–100.0)
Monocytes Absolute: 0.4 10*3/uL (ref 0.1–1.0)
Monocytes Relative: 7 %
Neutro Abs: 4.2 10*3/uL (ref 1.7–7.7)
Neutrophils Relative %: 72 %
Platelet Count: 101 10*3/uL — ABNORMAL LOW (ref 150–400)
RBC: 3.29 MIL/uL — ABNORMAL LOW (ref 3.87–5.11)
RDW: 16.1 % — ABNORMAL HIGH (ref 11.5–15.5)
WBC Count: 5.8 10*3/uL (ref 4.0–10.5)
nRBC: 0 % (ref 0.0–0.2)

## 2021-05-08 LAB — LACTATE DEHYDROGENASE: LDH: 155 U/L (ref 98–192)

## 2021-05-08 MED ORDER — ROMIPLOSTIM 125 MCG ~~LOC~~ SOLR
1.0000 ug/kg | Freq: Once | SUBCUTANEOUS | Status: AC
  Administered 2021-05-08: 100 ug via SUBCUTANEOUS
  Filled 2021-05-08: qty 0.2

## 2021-05-08 NOTE — Patient Instructions (Signed)
Romiplostim injection What is this medication? ROMIPLOSTIM (roe mi PLOE stim) helps your body make more platelets. This medicine is used to treat low platelets caused by chronic idiopathic thrombocytopenic purpura (ITP) or a bone marrow syndrome caused by radiation sickness. This medicine may be used for other purposes; ask your health care provider or pharmacist if you have questions. COMMON BRAND NAME(S): Nplate What should I tell my care team before I take this medication? They need to know if you have any of these conditions: blood clots myelodysplastic syndrome an unusual or allergic reaction to romiplostim, mannitol, other medicines, foods, dyes, or preservatives pregnant or trying to get pregnant breast-feeding How should I use this medication? This medicine is injected under the skin. It is given by a health care provider in a hospital or clinic setting. A special MedGuide will be given to you before each treatment. Be sure to read this information carefully each time. Talk to your health care provider about the use of this medicine in children. While it may be prescribed for children as young as newborns for selected conditions, precautions do apply. Overdosage: If you think you have taken too much of this medicine contact a poison control center or emergency room at once. NOTE: This medicine is only for you. Do not share this medicine with others. What if I miss a dose? Keep appointments for follow-up doses. It is important not to miss your dose. Call your health care provider if you are unable to keep an appointment. What may interact with this medication? Interactions are not expected. This list may not describe all possible interactions. Give your health care provider a list of all the medicines, herbs, non-prescription drugs, or dietary supplements you use. Also tell them if you smoke, drink alcohol, or use illegal drugs. Some items may interact with your medicine. What should I  watch for while using this medication? Visit your health care provider for regular checks on your progress. You may need blood work done while you are taking this medicine. Your condition will be monitored carefully while you are receiving this medicine. It is important not to miss any appointments. What side effects may I notice from receiving this medication? Side effects that you should report to your doctor or health care professional as soon as possible: allergic reactions (skin rash, itching or hives; swelling of the face, lips, or tongue) bleeding (bloody or black, tarry stools; red or dark brown urine; spitting up blood or brown material that looks like coffee grounds; red spots on the skin; unusual bruising or bleeding from the eyes, gums, or nose) blood clot (chest pain; shortness of breath; pain, swelling, or warmth in the leg) stroke (changes in vision; confusion; trouble speaking or understanding; severe headaches; sudden numbness or weakness of the face, arm or leg; trouble walking; dizziness; loss of balance or coordination) Side effects that usually do not require medical attention (report to your doctor or health care professional if they continue or are bothersome): diarrhea dizziness headache joint pain muscle pain stomach pain trouble sleeping This list may not describe all possible side effects. Call your doctor for medical advice about side effects. You may report side effects to FDA at 1-800-FDA-1088. Where should I keep my medication? This medicine is given in a hospital or clinic. It will not be stored at home. NOTE: This sheet is a summary. It may not cover all possible information. If you have questions about this medicine, talk to your doctor, pharmacist, or health care provider.    2022 Elsevier/Gold Standard (2019-10-09 10:28:13)  

## 2021-05-08 NOTE — Progress Notes (Signed)
Hematology and Oncology Follow Up Visit  [name] [id number] 1934/11/13 86 y.o. 05/08/2021   Principle Diagnosis:  Immune based thrombocytopenia History of PE Iron deficiency anemia Atrial fib   Current Therapy:        Nplate q 3 week for platelet count < 100K  IV Iron as indicated    Interim History:  [name] is here today with her daughter.  She actually looks quite good.  She is still wearing oxygen.  She does have a cardiac monitor on.  And sounds like she may have some bradycardia.  There is been no problems with bruising.  There is been no bleeding.  She does not have any problems with pain.  Her blood sugars are doing pretty well.  The ICU on the high side which is not unusual for her.  She had a chest x-ray couple days ago.  This showed a small right pleural effusion.  I think she really is asymptomatic with this.  Thankfully, she has avoided COVID.  She has had no fever.  There is been no nausea or vomiting.  Overall, I would say her performance status is ECOG 2.   Medications:  Allergies as of 05/08/2021       Reactions   Adhesive [tape] Other (See Comments)   PULLS OFF THE SKIN   Apixaban Other (See Comments)   Internal Bleeding   Aspirin Itching, Rash, Hives, Swelling   Swelling of her tongue   Mirabegron Other (See Comments)   Patient experienced A-Fib   Pineapple Anaphylaxis, Swelling   Throat swells and blisters on tongue and roof of mouth per patient   Metformin Diarrhea, Nausea Only   Tetracycline Hives   Fluticasone-salmeterol Itching, Rash   Iodinated Diagnostic Agents Itching, Rash      Lactose Intolerance (gi) Other (See Comments)   Gas   Latex Itching, Rash, Other (See Comments)   Pulls off the skin and causes welts   Lisinopril Cough   Metronidazole Other (See Comments)   Reaction not known   Sulfa Antibiotics Rash   Sulfonamide Derivatives Itching, Rash        Medication List        Accurate as of May 08, 2021  1:29 PM. If you have any questions, ask your nurse or doctor.          acetaminophen 325 MG tablet Commonly known as: TYLENOL Take 650 mg by mouth in the morning and at bedtime.   amLODipine 2.5 MG tablet Commonly known as: NORVASC Take 3 tablets (7.5 mg total) by mouth daily.   amoxicillin 500 MG tablet Commonly known as: AMOXIL Take 2,000 mg by mouth as needed. Prior to Dental Appt   atorvastatin 40 MG tablet Commonly known as: LIPITOR Take 1 tablet (40 mg total) by mouth at bedtime.   BD Pen Needle Nano 2nd Gen 32G X 4 MM Misc Generic drug: Insulin Pen Needle USE TO INJECT UP TO FOUR TIMES DAILY AS DIRECTED   bisacodyl 5 MG EC tablet Commonly known as: DULCOLAX Take 10 mg by mouth every 3 (three) days as needed (for constipation).   fexofenadine 180 MG tablet Commonly known as: ALLEGRA Take 180 mg by mouth daily.   FreeStyle Libre 14 Day Sensor Misc APPLY NEW SENSOR EVERY 14 DAYS AS DIRECTED   hyoscyamine 0.125 MG tablet Commonly known as: LEVSIN Take 1 tablet by mouth daily.   insulin lispro 100 UNIT/ML KwikPen Commonly known as: HumaLOG KwikPen INJECT 13 TO 20  UNITS UNDER THE SKIN THREE TIMES DAILY WITH BREAKFAST, LUNCH AND EVENING MEAL   INSULIN SYRINGE .3CC/29GX1/2" 29G X 1/2" 0.3 ML Misc Use to inject insulin 4 times a day.   Lantus SoloStar 100 UNIT/ML Solostar Pen Generic drug: insulin glargine Inject 46 Units into the skin at bedtime.   levothyroxine 112 MCG tablet Commonly known as: SYNTHROID TAKE 1 TABLET(112 MCG) BY MOUTH DAILY   multivitamin tablet Take 1 tablet by mouth daily.   sertraline 50 MG tablet Commonly known as: ZOLOFT TAKE 1 TABLET BY MOUTH AT BEDTIME   torsemide 20 MG tablet Commonly known as: DEMADEX Take 40 mg by mouth every morning.   vitamin B-12 100 MCG tablet Commonly known as: CYANOCOBALAMIN Take 100 mcg by mouth daily.   vitamin C 250 MG tablet Commonly known as: ASCORBIC ACID Take 250 mg by mouth  daily.   Vitamin D3 50 MCG (2000 UT) Tabs Take 2,000 Units by mouth daily.   Xeroform Petrolat Patch 4"x4" Pads Apply 1 Units topically daily as needed.        Allergies:  Allergies  Allergen Reactions   Adhesive [Tape] Other (See Comments)    PULLS OFF THE SKIN   Apixaban Other (See Comments)    Internal Bleeding   Aspirin Itching, Rash, Hives and Swelling    Swelling of her tongue   Mirabegron Other (See Comments)    Patient experienced A-Fib   Pineapple Anaphylaxis and Swelling    Throat swells and blisters on tongue and roof of mouth per patient   Metformin Diarrhea and Nausea Only   Tetracycline Hives   Fluticasone-Salmeterol Itching and Rash   Iodinated Diagnostic Agents Itching and Rash        Lactose Intolerance (Gi) Other (See Comments)    Gas    Latex Itching, Rash and Other (See Comments)    Pulls off the skin and causes welts   Lisinopril Cough   Metronidazole Other (See Comments)    Reaction not known   Sulfa Antibiotics Rash   Sulfonamide Derivatives Itching and Rash    Past Medical History, Surgical history, Social history, and Family History were reviewed and updated.  Review of Systems: All other 10 point review of systems is negative.   Physical Exam:  weight is 217 lb (98.4 kg). Her oral temperature is 98.6 F (37 C). Her blood pressure is 167/42 (abnormal) and her pulse is 63. Her respiration is 16 and oxygen saturation is 99%.   Wt Readings from Last 3 Encounters:  05/08/21 217 lb (98.4 kg)  05/05/21 219 lb (99.3 kg)  05/02/21 220 lb (99.8 kg)    Ocular: Sclerae unicteric, pupils equal, round and reactive to light Ear-nose-throat: Oropharynx clear, dentition fair Lymphatic: No cervical or supraclavicular adenopathy Lungs no rales or rhonchi, good excursion bilaterally Heart regular rate and rhythm, no murmur appreciated Abd soft, nontender, positive bowel sounds MSK no focal spinal tenderness, no joint edema Neuro: non-focal,  well-oriented, appropriate affect Breasts: Deferred   Lab Results  Component Value Date   WBC 5.8 05/08/2021   HGB 10.0 (L) 05/08/2021   HCT 31.5 (L) 05/08/2021   MCV 95.7 05/08/2021   PLT 101 (L) 05/08/2021   Lab Results  Component Value Date   FERRITIN 290 03/13/2021   IRON 72 03/13/2021   TIBC 316 03/13/2021   UIBC 244 03/13/2021   IRONPCTSAT 23 03/13/2021   Lab Results  Component Value Date   RETICCTPCT 2.1 05/08/2021   RBC 3.29 (L) 05/08/2021  RETICCTABS 73.3 01/28/2011   No results found for: KPAFRELGTCHN, LAMBDASER, KAPLAMBRATIO No results found for: Kandis Cocking, IGMSERUM No results found for: Odetta Pink, SPEI   Chemistry      Component Value Date/Time   NA 139 05/08/2021 1146   NA 145 09/08/2017 1317   NA 142 02/25/2017 1013   K 5.3 (H) 05/08/2021 1146   K 4.5 09/08/2017 1317   K 3.8 02/25/2017 1013   CL 101 05/08/2021 1146   CL 98 09/08/2017 1317   CO2 30 05/08/2021 1146   CO2 32 09/08/2017 1317   CO2 26 02/25/2017 1013   BUN 68 (H) 05/08/2021 1146   BUN 34 (H) 09/08/2017 1317   BUN 20.1 02/25/2017 1013   CREATININE 2.49 (H) 05/08/2021 1146   CREATININE 1.98 (H) 01/17/2021 1601   CREATININE 1.8 (H) 02/25/2017 1013      Component Value Date/Time   CALCIUM 9.5 05/08/2021 1146   CALCIUM 9.3 09/08/2017 1317   CALCIUM 8.7 02/25/2017 1013   ALKPHOS 61 05/08/2021 1146   ALKPHOS 45 09/08/2017 1317   ALKPHOS 65 02/25/2017 1013   AST 16 05/08/2021 1146   AST 16 02/25/2017 1013   ALT 16 05/08/2021 1146   ALT 26 09/08/2017 1317   ALT 13 02/25/2017 1013   BILITOT 0.5 05/08/2021 1146   BILITOT 0.76 02/25/2017 1013       Impression and Plan: Ms. Bradway is a very pleasant 86 yo caucasian female with immune based thrombocytopenia.   I will go ahead and give her a dose of Nplate today.  I do think this would be helpful.  Her platelet count has been trending downward.  She is not had Nplate probably  for a year.  Her last iron studies back in July showed a ferritin of 290 with an iron saturation of 23%.  We probably will plan to get her back in another 6 weeks.  I would like to make sure we get her back before the holidays.  Volanda Napoleon, MD 9/1/20221:29 PM

## 2021-05-08 NOTE — Telephone Encounter (Signed)
Appts made and printed for pt per 05/08/21 los  [name] 

## 2021-05-09 ENCOUNTER — Encounter: Payer: Self-pay | Admitting: *Deleted

## 2021-05-09 ENCOUNTER — Encounter: Payer: Self-pay | Admitting: Family Medicine

## 2021-05-09 ENCOUNTER — Ambulatory Visit (INDEPENDENT_AMBULATORY_CARE_PROVIDER_SITE_OTHER): Payer: Medicare Other | Admitting: Family Medicine

## 2021-05-09 VITALS — BP 118/74 | HR 59 | Temp 98.1°F | Ht 61.0 in | Wt 224.3 lb

## 2021-05-09 DIAGNOSIS — I130 Hypertensive heart and chronic kidney disease with heart failure and stage 1 through stage 4 chronic kidney disease, or unspecified chronic kidney disease: Secondary | ICD-10-CM | POA: Diagnosis not present

## 2021-05-09 DIAGNOSIS — Z794 Long term (current) use of insulin: Secondary | ICD-10-CM | POA: Diagnosis not present

## 2021-05-09 DIAGNOSIS — I10 Essential (primary) hypertension: Secondary | ICD-10-CM | POA: Diagnosis not present

## 2021-05-09 DIAGNOSIS — E1122 Type 2 diabetes mellitus with diabetic chronic kidney disease: Secondary | ICD-10-CM | POA: Diagnosis not present

## 2021-05-09 DIAGNOSIS — E7800 Pure hypercholesterolemia, unspecified: Secondary | ICD-10-CM

## 2021-05-09 DIAGNOSIS — D693 Immune thrombocytopenic purpura: Secondary | ICD-10-CM | POA: Diagnosis not present

## 2021-05-09 DIAGNOSIS — N184 Chronic kidney disease, stage 4 (severe): Secondary | ICD-10-CM | POA: Diagnosis not present

## 2021-05-09 DIAGNOSIS — E039 Hypothyroidism, unspecified: Secondary | ICD-10-CM | POA: Diagnosis not present

## 2021-05-09 DIAGNOSIS — R609 Edema, unspecified: Secondary | ICD-10-CM

## 2021-05-09 DIAGNOSIS — I2510 Atherosclerotic heart disease of native coronary artery without angina pectoris: Secondary | ICD-10-CM

## 2021-05-09 DIAGNOSIS — R001 Bradycardia, unspecified: Secondary | ICD-10-CM

## 2021-05-09 DIAGNOSIS — I480 Paroxysmal atrial fibrillation: Secondary | ICD-10-CM

## 2021-05-09 DIAGNOSIS — Z23 Encounter for immunization: Secondary | ICD-10-CM

## 2021-05-09 LAB — IRON AND TIBC
Iron: 71 ug/dL (ref 41–142)
Saturation Ratios: 23 % (ref 21–57)
TIBC: 301 ug/dL (ref 236–444)
UIBC: 231 ug/dL (ref 120–384)

## 2021-05-09 LAB — FERRITIN: Ferritin: 287 ng/mL (ref 11–307)

## 2021-05-09 NOTE — Patient Instructions (Signed)
For next 3 days; take 60mg  of torsemide daily and then resume your 40mg  daily dose.

## 2021-05-09 NOTE — Progress Notes (Signed)
Loria Lacina DOB: November 25, 1934 Encounter date: 05/09/2021  This is a 86 y.o. female who presents with Chief Complaint  Patient presents with   Follow-up    History of present illness:  *just turned back in monitor yesterday for heart. Had first visit with Dr. Harrell Gave and was very pleased with this experience. She did have one episode of bradycardia, but has since been taken off of the metoprolol.   Feels great after getting platelet shot yesterday.   Following with pulmonologist in a few weeks. Actually feels better since platelet shot yesterday. Slept last night without oxygen. CPAP has been ordered and settings have changed. She was worried with other cpaps being recalled, but hers isn't on that list.   HTN: does check at home - has been similar to today's reading. No issues with dizziness/light headedness except one time during monitor for just a few seconds - but this was when HR was in the 30's.   DMII: sugars have been good. Follows up with Dr. Renne Crigler on 9/19.   Seeing nephro more often now with increase dose of torsemide.     Allergies  Allergen Reactions   Adhesive [Tape] Other (See Comments)    PULLS OFF THE SKIN   Apixaban Other (See Comments)    Internal Bleeding   Aspirin Itching, Rash, Hives and Swelling    Swelling of her tongue   Mirabegron Other (See Comments)    Patient experienced A-Fib   Pineapple Anaphylaxis and Swelling    Throat swells and blisters on tongue and roof of mouth per patient   Metformin Diarrhea and Nausea Only   Tetracycline Hives   Fluticasone-Salmeterol Itching and Rash   Iodinated Diagnostic Agents Itching and Rash        Lactose Intolerance (Gi) Other (See Comments)    Gas    Latex Itching, Rash and Other (See Comments)    Pulls off the skin and causes welts   Lisinopril Cough   Metronidazole Other (See Comments)    Reaction not known   Sulfa Antibiotics Rash   Sulfonamide Derivatives Itching and Rash   Current  Meds  Medication Sig   acetaminophen (TYLENOL) 325 MG tablet Take 650 mg by mouth in the morning and at bedtime.   amLODipine (NORVASC) 2.5 MG tablet Take 3 tablets (7.5 mg total) by mouth daily.   amoxicillin (AMOXIL) 500 MG tablet Take 2,000 mg by mouth as needed. Prior to Dental Appt   atorvastatin (LIPITOR) 40 MG tablet Take 1 tablet (40 mg total) by mouth at bedtime.   BD PEN NEEDLE NANO 2ND GEN 32G X 4 MM MISC USE TO INJECT UP TO FOUR TIMES DAILY AS DIRECTED   bisacodyl (DULCOLAX) 5 MG EC tablet Take 10 mg by mouth every 3 (three) days as needed (for constipation).   Bismuth Tribromoph-Petrolatum (XEROFORM PETROLAT PATCH 4"X4") PADS Apply 1 Units topically daily as needed.   Cholecalciferol (VITAMIN D3) 2000 units TABS Take 5,000 Units by mouth daily.   Continuous Blood Gluc Sensor (FREESTYLE LIBRE 14 DAY SENSOR) MISC APPLY NEW SENSOR EVERY 14 DAYS AS DIRECTED   fexofenadine (ALLEGRA) 180 MG tablet Take 180 mg by mouth daily.   hyoscyamine (LEVSIN) 0.125 MG tablet Take 1 tablet by mouth daily.   insulin glargine (LANTUS SOLOSTAR) 100 UNIT/ML Solostar Pen Inject 46 Units into the skin at bedtime.   insulin lispro (HUMALOG KWIKPEN) 100 UNIT/ML KwikPen INJECT 13 TO 20 UNITS UNDER THE SKIN THREE TIMES DAILY WITH BREAKFAST, LUNCH AND EVENING  MEAL   Insulin Syringe-Needle U-100 (INSULIN SYRINGE .3CC/29GX1/2") 29G X 1/2" 0.3 ML MISC Use to inject insulin 4 times a day.   levothyroxine (SYNTHROID) 112 MCG tablet TAKE 1 TABLET(112 MCG) BY MOUTH DAILY   Multiple Vitamin (MULTIVITAMIN) tablet Take 1 tablet by mouth daily.   sertraline (ZOLOFT) 50 MG tablet TAKE 1 TABLET BY MOUTH AT BEDTIME   torsemide (DEMADEX) 20 MG tablet Take 40 mg by mouth every morning.   vitamin B-12 (CYANOCOBALAMIN) 100 MCG tablet Take 100 mcg by mouth daily.   vitamin C (ASCORBIC ACID) 250 MG tablet Take 250 mg by mouth daily.    Review of Systems  Constitutional:  Negative for chills, fatigue (feeling much more energetic  today) and fever.  Respiratory:  Negative for cough, chest tightness, shortness of breath and wheezing.   Cardiovascular:  Positive for leg swelling (she feels this is better than it has been in past). Negative for chest pain and palpitations.   Objective:  BP 118/74 (BP Location: Left Arm, Patient Position: Sitting, Cuff Size: Normal)   Pulse (!) 59   Temp 98.1 F (36.7 C) (Oral)   Ht 5\' 1"  (1.549 m)   Wt 224 lb 4.8 oz (101.7 kg)   LMP  (LMP Unknown)   SpO2 98%   BMI 42.38 kg/m   Weight: 224 lb 4.8 oz (101.7 kg)   BP Readings from Last 3 Encounters:  05/09/21 118/74  05/08/21 (!) 167/42  05/05/21 130/76   Wt Readings from Last 3 Encounters:  05/09/21 224 lb 4.8 oz (101.7 kg)  05/08/21 217 lb (98.4 kg)  05/05/21 219 lb (99.3 kg)    Physical Exam Constitutional:      General: She is not in acute distress.    Appearance: She is well-developed. She is obese.  Cardiovascular:     Rate and Rhythm: Normal rate and regular rhythm.     Heart sounds: Normal heart sounds. No murmur heard.   No friction rub.  Pulmonary:     Effort: Pulmonary effort is normal. No respiratory distress.     Breath sounds: Normal breath sounds. No wheezing or rales.  Musculoskeletal:     Right lower leg: 1+ Edema present.     Left lower leg: 1+ Edema present.  Neurological:     Mental Status: She is alert and oriented to person, place, and time.  Psychiatric:        Behavior: Behavior normal.    Assessment/Plan  1. Need for immunization against influenza - Flu Vaccine QUAD High Dose(Fluad)  2. Essential hypertension Has been well controlled; continue to monitor esp for low pressures.   3. Atherosclerosis of native coronary artery of native heart without angina pectoris Following with cardiology. She is on lilpitor 40mg .   4. Hypertensive heart and chronic kidney disease with heart failure and stage 1 through stage 4 chronic kidney disease, or unspecified chronic kidney disease  ([place]) Follows with both cardiology and nephrology regularly.   5. Paroxysmal atrial fibrillation (HCC) Not on anticoagulation due to thrombocytopenia, which is managed by hematology.   6. Hypothyroidism, unspecified type Continue with synthorid 111mcg daily. Follows with endocrinology.   7. Type 2 diabetes mellitus with stage 4 chronic kidney disease, with long-term current use of insulin ([place]) Well controlled with lantus, humalog. Managed by endocrinology.   8. Chronic ITP (idiopathic thrombocytopenia) (HCC) Just had platelet infusion; follows regularly with hematology.   9. Chronic kidney disease (CKD) stage G4/A1, severely decreased glomerular filtration rate (GFR) between 15-29  mL/min/1.73 square meter and albuminuria creatinine ratio less than 30 mg/g (HCC) Follows regularly with nephrology. Monitoring more closely due to increased need for diuresis.   28. HYPERCHOLESTEROLEMIA Well controlled on lipitor.   11. Edema, unspecified type Stable. She is following with vascular to discuss surgery for incompetent valves. She does wear compression daily and tries to elevate legs when sitting. She does watch salt intake.   Return in about 4 months (around 09/08/2021) for Chronic condition visit.     Micheline Rough, MD

## 2021-05-11 ENCOUNTER — Encounter: Payer: Self-pay | Admitting: Family Medicine

## 2021-05-15 NOTE — Progress Notes (Signed)
PCP:  Caren Macadam, MD Primary Cardiologist: Buford Dresser, MD Electrophysiologist: Thompson Grayer, MD   [name] is a 86 y.o. female seen today for Thompson Grayer, MD for routine electrophysiology followup.    Seen by Dr. Harrell Gave 8/25 and was in a junctional bradycardia vs AF with slow VR in the 40s. Metoprolol was stopped.    Since last being seen in our clinic the patient reports doing very well. Her HRs at home have been 50-70s at rest or with mild activity. She denies syncope, lightheadednes, SOB, or near syncope. No CP. She is in a WC here today, but is able to walk around her apartment and do ADLs without difficulty. She wears leg braces and compression hose for chronic venous stasis. Her mother had a pacemaker late in life.  Past Medical History:  Diagnosis Date   Allergic rhinitis    Anxiety state, unspecified    panic attacks   CHF (congestive heart failure) (HCC)    Depressive disorder, not elsewhere classified    Disorientation, unspecified 08/07/2019   Epistaxis 03/24/2018   Extrinsic asthma, unspecified    no problem since adulthood   History of complications due to general anesthesia 03/03/2021   History of pulmonary embolism 08/07/2019   Hypertensive urgency 01/30/2021   Obesity    OSA on CPAP    severe   Pneumonitis 07/14/2019   Pressure injury of skin 07/10/2019   Pulmonary embolism ([place])    Pure hypercholesterolemia    Respiratory failure with hypoxia ([place]) 09/2008   acute, secondary to multiple bilateral pulmonary embolism , negative hypercoagulable workup 09/2008 hospital stay   Scoliosis    Type II or unspecified type diabetes mellitus without mention of complication, not stated as uncontrolled    Unspecified hypothyroidism    hypo   Past Surgical History:  Procedure Laterality Date   CARDIOVERSION N/A 06/26/2019   Procedure: CARDIOVERSION;  Surgeon: Jerline Pain, MD;  Location: [facility];  Service: Cardiovascular;   Laterality: N/A;   EYE SURGERY Bilateral 2005   cataracts, implants   KIDNEY STONE SURGERY Bilateral    2016, 2017    knee replaced Left 2013   THYROID SURGERY  1966   nodule removal; partial thyroidectomy; radioactive iodine x2; 1990's    Current Outpatient Medications  Medication Sig Dispense Refill   acetaminophen (TYLENOL) 325 MG tablet Take 650 mg by mouth in the morning and at bedtime.     amLODipine (NORVASC) 2.5 MG tablet Take 3 tablets (7.5 mg total) by mouth daily. 270 tablet 1   amoxicillin (AMOXIL) 500 MG tablet Take 2,000 mg by mouth as needed. Prior to Dental Appt     atorvastatin (LIPITOR) 40 MG tablet Take 1 tablet (40 mg total) by mouth at bedtime. 90 tablet 0   BD PEN NEEDLE NANO 2ND GEN 32G X 4 MM MISC USE TO INJECT UP TO FOUR TIMES DAILY AS DIRECTED 200 each 3   bisacodyl (DULCOLAX) 5 MG EC tablet Take 10 mg by mouth every 3 (three) days as needed (for constipation).     Bismuth Tribromoph-Petrolatum (XEROFORM PETROLAT PATCH 4"X4") PADS Apply 1 Units topically daily as needed. 75 each 5   Cholecalciferol (VITAMIN D3) 2000 units TABS Take 5,000 Units by mouth daily.     Continuous Blood Gluc Sensor (FREESTYLE LIBRE 14 DAY SENSOR) MISC APPLY NEW SENSOR EVERY 14 DAYS AS DIRECTED 6 each 3   fexofenadine (ALLEGRA) 180 MG tablet Take 180 mg by mouth daily.  hyoscyamine (LEVSIN) 0.125 MG tablet Take 1 tablet by mouth daily.     insulin glargine (LANTUS SOLOSTAR) 100 UNIT/ML Solostar Pen Inject 46 Units into the skin at bedtime.     insulin lispro (HUMALOG KWIKPEN) 100 UNIT/ML KwikPen INJECT 13 TO 20 UNITS UNDER THE SKIN THREE TIMES DAILY WITH BREAKFAST, LUNCH AND EVENING MEAL 30 mL 1   Insulin Syringe-Needle U-100 (INSULIN SYRINGE .3CC/29GX1/2") 29G X 1/2" 0.3 ML MISC Use to inject insulin 4 times a day. 400 each 3   levothyroxine (SYNTHROID) 112 MCG tablet TAKE 1 TABLET(112 MCG) BY MOUTH DAILY 90 tablet 1   Multiple Vitamin (MULTIVITAMIN) tablet Take 1 tablet by mouth  daily.     sertraline (ZOLOFT) 50 MG tablet TAKE 1 TABLET BY MOUTH AT BEDTIME 90 tablet 1   torsemide (DEMADEX) 20 MG tablet Take 40 mg by mouth every morning.     vitamin B-12 (CYANOCOBALAMIN) 100 MCG tablet Take 100 mcg by mouth daily.     vitamin C (ASCORBIC ACID) 250 MG tablet Take 250 mg by mouth daily.     No current facility-administered medications for this visit.    Allergies  Allergen Reactions   Adhesive [Tape] Other (See Comments)    PULLS OFF THE SKIN   Apixaban Other (See Comments)    Internal Bleeding   Aspirin Itching, Rash, Hives and Swelling    Swelling of her tongue   Mirabegron Other (See Comments)    Patient experienced A-Fib   Pineapple Anaphylaxis and Swelling    Throat swells and blisters on tongue and roof of mouth per patient   Metformin Diarrhea and Nausea Only   Tetracycline Hives   Fluticasone-Salmeterol Itching and Rash   Iodinated Diagnostic Agents Itching and Rash        Lactose Intolerance (Gi) Other (See Comments)    Gas    Latex Itching, Rash and Other (See Comments)    Pulls off the skin and causes welts   Lisinopril Cough   Metronidazole Other (See Comments)    Reaction not known   Sulfa Antibiotics Rash   Sulfonamide Derivatives Itching and Rash    Social History   Socioeconomic History   Marital status: Widowed    Spouse name: Not on file   Number of children: 2   Years of education: Not on file   Highest education level: Not on file  Occupational History   Occupation: OWNER    Employer: [employer]    Comment: Self employed- runs Advertising copywriter  Tobacco Use   Smoking status: Former    Packs/day: 0.25    Years: 20.00    Pack years: 5.00    Types: Cigarettes    Start date: 1958    Quit date: 09/07/1989    Years since quitting: 31.7   Smokeless tobacco: Never  Vaping Use   Vaping Use: Never used  Substance and Sexual Activity   Alcohol use: No   Drug use: No   Sexual activity: Not Currently  Other Topics Concern    Not on file  Social History Narrative   Lives at [place] Strain: Low Risk    Difficulty of Paying Living Expenses: Not hard at all  Food Insecurity: No Food Insecurity   Worried About Charity fundraiser in the Last Year: Never true   [place] in the Last Year: Never true  Transportation Needs: No Transportation Needs   Lack of  Transportation (Medical): No   Lack of Transportation (Non-Medical): No  Physical Activity: Inactive   Days of Exercise per Week: 0 days   Minutes of Exercise per Session: 0 min  Stress: No Stress Concern Present   Feeling of Stress : Not at all  Social Connections: Socially Isolated   Frequency of Communication with Friends and Family: Three times a week   Frequency of Social Gatherings with Friends and Family: Three times a week   Attends Religious Services: Never   Active Member of Clubs or Organizations: No   Attends Archivist Meetings: Never   Marital Status: Widowed  Human resources officer Violence: Not At Risk   Fear of Current or Ex-Partner: No   Emotionally Abused: No   Physically Abused: No   Sexually Abused: No     Review of Systems: All other systems reviewed and are otherwise negative except as noted above.  Physical Exam: There were no vitals filed for this visit.  GEN- The patient is well appearing, alert and oriented x 3 today.   HEENT: normocephalic, atraumatic; sclera clear, conjunctiva pink; hearing intact; oropharynx clear; neck supple, no JVP Lymph- no cervical lymphadenopathy Lungs- Clear to ausculation bilaterally, normal work of breathing.  No wheezes, rales, rhonchi Heart- Regular rate and rhythm, no murmurs, rubs or gallops, PMI not laterally displaced GI- soft, non-tender, non-distended, bowel sounds present, no hepatosplenomegaly Extremities- no clubbing, cyanosis, or edema; DP/PT/radial pulses 2+ bilaterally MS- no significant deformity or  atrophy Skin- warm and dry, no rash or lesion Psych- euthymic mood, full affect Neuro- strength and sensation are intact  EKG is ordered. Personal review of EKG from today shows junctional bradycardia at 54 bpm vs AF with slow VR. No appreciable p waves  Additional studies reviewed include: Previous EP, AF, and gen cards office notes.   Assessment and Plan:  1.Permanent afib with slow VR vs junctional bradycardia Rates had been controlled, now she has been taken off Toprol for bradycardia.  HRs at home 50-70s and asymptomatic. She feels "so much better" off toprol.  With pulse ox attached, she is able to march in place for several seconds with her HR increasing from 52 to 65. She reports HRs into 70s with walking around her home.  No clear indication for pacing at this time. We discussed the indication of tachy-brady syndrome at length. She understands if she has further symptomatic bradycardia OFF AV nodal agents or poorly controlled AF rates she would likely require pacing at that time.  Avoid AV nodal agents. She has some interest in [facility], but from history, she does not tolerate any OAC at all due to thrombocytopenia and h/o bleeding, so not sure that she will be a candidate. Will focus on following her bradycardia for now.    2. Hypercoaguable state  CHA2DS2-VASc is at least 5.   Not on anticoagulation 2/2 thrombocytopenia and h/o bleeding of unknown source    3. HTN Stable on current regimen   4. Chronic diastolic HF Echo 1/88/41 LVEF 60-65%  5. CKD IV Cr 2.2 - 2.4 at baseline. Soft tissue of the neck CT 6/30 shows moderately large rt pleural effusion with small left pleural effusion which likely explains her increasing shortness of breath and low sats  RTC 3 months. Sooner with symptomatic bradycardia or rapid rates.  Shirley Friar, PA-C  05/15/21 1:21 PM

## 2021-05-16 ENCOUNTER — Ambulatory Visit (INDEPENDENT_AMBULATORY_CARE_PROVIDER_SITE_OTHER): Payer: Medicare Other | Admitting: Student

## 2021-05-16 ENCOUNTER — Encounter: Payer: Self-pay | Admitting: Student

## 2021-05-16 ENCOUNTER — Other Ambulatory Visit: Payer: Self-pay

## 2021-05-16 VITALS — BP 114/58 | HR 54 | Ht 61.0 in | Wt 222.0 lb

## 2021-05-16 DIAGNOSIS — I4821 Permanent atrial fibrillation: Secondary | ICD-10-CM

## 2021-05-16 DIAGNOSIS — I10 Essential (primary) hypertension: Secondary | ICD-10-CM | POA: Diagnosis not present

## 2021-05-16 DIAGNOSIS — I872 Venous insufficiency (chronic) (peripheral): Secondary | ICD-10-CM | POA: Diagnosis not present

## 2021-05-16 DIAGNOSIS — R001 Bradycardia, unspecified: Secondary | ICD-10-CM | POA: Diagnosis not present

## 2021-05-16 NOTE — Patient Instructions (Signed)
Medication Instructions:  Your physician recommends that you continue on your current medications as directed. Please refer to the Current Medication list given to you today.  *If you need a refill on your cardiac medications before your next appointment, please call your pharmacy*   Lab Work: None If you have labs (blood work) drawn today and your tests are completely normal, you will receive your results only by: [facility] (if you have MyChart) OR A paper copy in the mail If you have any lab test that is abnormal or we need to change your treatment, we will call you to review the results.   Follow-Up: At [facility], you and your health needs are our priority.  As part of our continuing mission to provide you with exceptional heart care, we have created designated Provider Care Teams.  These Care Teams include your primary Cardiologist (physician) and Advanced Practice Providers (APPs -  Physician Assistants and Nurse Practitioners) who all work together to provide you with the care you need, when you need it.   Your next appointment:   3 month(s)  The format for your next appointment:   In Person  Provider:   You may see Thompson Grayer, MD or one of the following Advanced Practice Providers on your designated Care Team:   Tommye Standard, [place] Legrand Como "[facility]" [place], [place]

## 2021-05-19 DIAGNOSIS — N289 Disorder of kidney and ureter, unspecified: Secondary | ICD-10-CM | POA: Diagnosis not present

## 2021-05-19 DIAGNOSIS — I10 Essential (primary) hypertension: Secondary | ICD-10-CM | POA: Diagnosis not present

## 2021-05-19 DIAGNOSIS — R82991 Hypocitraturia: Secondary | ICD-10-CM | POA: Diagnosis not present

## 2021-05-19 DIAGNOSIS — G4733 Obstructive sleep apnea (adult) (pediatric): Secondary | ICD-10-CM | POA: Diagnosis not present

## 2021-05-19 DIAGNOSIS — N200 Calculus of kidney: Secondary | ICD-10-CM | POA: Diagnosis not present

## 2021-05-19 DIAGNOSIS — R82994 Hypercalciuria: Secondary | ICD-10-CM | POA: Diagnosis not present

## 2021-05-19 DIAGNOSIS — J9690 Respiratory failure, unspecified, unspecified whether with hypoxia or hypercapnia: Secondary | ICD-10-CM | POA: Diagnosis not present

## 2021-05-19 DIAGNOSIS — M6281 Muscle weakness (generalized): Secondary | ICD-10-CM | POA: Diagnosis not present

## 2021-05-19 DIAGNOSIS — R34 Anuria and oliguria: Secondary | ICD-10-CM | POA: Diagnosis not present

## 2021-05-19 DIAGNOSIS — J40 Bronchitis, not specified as acute or chronic: Secondary | ICD-10-CM | POA: Diagnosis not present

## 2021-05-19 DIAGNOSIS — R82992 Hyperoxaluria: Secondary | ICD-10-CM | POA: Diagnosis not present

## 2021-05-19 DIAGNOSIS — M1030 Gout due to renal impairment, unspecified site: Secondary | ICD-10-CM | POA: Diagnosis not present

## 2021-05-20 ENCOUNTER — Other Ambulatory Visit: Payer: Self-pay | Admitting: Family Medicine

## 2021-05-21 ENCOUNTER — Other Ambulatory Visit: Payer: Self-pay

## 2021-05-21 ENCOUNTER — Encounter (HOSPITAL_BASED_OUTPATIENT_CLINIC_OR_DEPARTMENT_OTHER): Payer: Self-pay | Admitting: Cardiology

## 2021-05-21 ENCOUNTER — Ambulatory Visit (INDEPENDENT_AMBULATORY_CARE_PROVIDER_SITE_OTHER): Payer: Medicare Other | Admitting: Cardiology

## 2021-05-21 VITALS — BP 146/50 | HR 58 | Ht 61.0 in | Wt 221.0 lb

## 2021-05-21 DIAGNOSIS — E118 Type 2 diabetes mellitus with unspecified complications: Secondary | ICD-10-CM

## 2021-05-21 DIAGNOSIS — I5032 Chronic diastolic (congestive) heart failure: Secondary | ICD-10-CM | POA: Diagnosis not present

## 2021-05-21 DIAGNOSIS — Z794 Long term (current) use of insulin: Secondary | ICD-10-CM

## 2021-05-21 DIAGNOSIS — I480 Paroxysmal atrial fibrillation: Secondary | ICD-10-CM | POA: Diagnosis not present

## 2021-05-21 DIAGNOSIS — I872 Venous insufficiency (chronic) (peripheral): Secondary | ICD-10-CM

## 2021-05-21 DIAGNOSIS — Z712 Person consulting for explanation of examination or test findings: Secondary | ICD-10-CM | POA: Diagnosis not present

## 2021-05-21 NOTE — Patient Instructions (Signed)
Medication Instructions:  Your physician recommends that you continue on your current medications as directed. Please refer to the Current Medication list given to you today.  *If you need a refill on your cardiac medications before your next appointment, please call your pharmacy*  Follow-Up: At [facility], you and your health needs are our priority.  As part of our continuing mission to provide you with exceptional heart care, we have created designated Provider Care Teams.  These Care Teams include your primary Cardiologist (physician) and Advanced Practice Providers (APPs -  Physician Assistants and Nurse Practitioners) who all work together to provide you with the care you need, when you need it.  We recommend signing up for the patient portal called "MyChart".  Sign up information is provided on this After Visit Summary.  MyChart is used to connect with patients for Virtual Visits (Telemedicine).  Patients are able to view lab/test results, encounter notes, upcoming appointments, etc.  Non-urgent messages can be sent to your provider as well.   To learn more about what you can do with MyChart, go to [email or web address].    Your next appointment:   4-5 month(s)  The format for your next appointment:   In Person  Provider:   Buford Dresser, MD

## 2021-05-21 NOTE — Progress Notes (Signed)
Cardiology Office Note:    Date:  05/21/2021   ID:  [name], DOB 1934/12/08, MRN [id number]  PCP:  Caren Macadam, MD  Cardiologist:  Buford Dresser, MD  Referring MD: Caren Macadam, MD   CC: follow up  History of Present Illness:    [name] is a 86 y.o. female with a hx of chronic diastolic heart failure, with admission for acute exacerbation 03/2021, paroxysmal atrial fibrillation, chronic thrombocytopenia, hypertension, hypercholesterolemia, chronic respiratory failure with hypoxia on home O2, chronic kidney disease stage 4, diabetes, hypothyroidism, obesity, and OSA on CPAP who is seen for follow up. I first met her 05/01/21 for hospital follow-up. She was admitted to the hospital 03/14/2021 for acute on chronic diastolic congestive heart failure.   Team: Dr. Marin Olp: heme Dr. Joelyn Oms: nephrology Dr. Ethlyn Gallery: PCP Dr. Cruzita Lederer: endocrinology Dr. Elsworth Soho: pulmonology/sleep medicine  She was seen by Ermalinda Barrios on 03/05/2021 with worsening palpitations. Metoprolol succinate 25 mg daily added at that time. She followed up with Roderic Palau in the [place] [facility] on 03/14/21. She noted worsening shortness of breath over the prior two weeks, and she was noted to have O2 sats in the upper 70s/low 80s on room air. She was sent to [facility] ER and admitted until 03/18/2021. She was discharged on home O2.  Today: She is accompanied by her caregiver and her daughter, who also provides some history.  Overall she is feeling great. Recently she received a platelet stimulating injection. Afterwards, she reports that she no longer needs to be on oxygen. She generally feels much better. Her previous treatment was 1.5 years ago.  At home she is regularly monitoring her blood pressure, which is typically stable around 119/80.  For exercise, she plans to increase her activity with pilates and yoga classes.  We discussed in detail her recent monitor results (05/2021). Off  of the metoprolol her heart rate seems to be appropriately stable.  In December she is scheduled to see Dr. Rayann Heman to discuss a pacemaker.  She denies any palpitations, chest pain, or shortness of breath. No lightheadedness, headaches, syncope, orthopnea, or PND. Also has no lower extremity edema or exertional symptoms.  Past Medical History:  Diagnosis Date   Allergic rhinitis    Anxiety state, unspecified    panic attacks   CHF (congestive heart failure) (HCC)    Depressive disorder, not elsewhere classified    Disorientation, unspecified 08/07/2019   Epistaxis 03/24/2018   Extrinsic asthma, unspecified    no problem since adulthood   History of complications due to general anesthesia 03/03/2021   History of pulmonary embolism 08/07/2019   Hypertensive urgency 01/30/2021   Obesity    OSA on CPAP    severe   Pneumonitis 07/14/2019   Pressure injury of skin 07/10/2019   Pulmonary embolism ([place])    Pure hypercholesterolemia    Respiratory failure with hypoxia ([place]) 09/2008   acute, secondary to multiple bilateral pulmonary embolism , negative hypercoagulable workup 09/2008 hospital stay   Scoliosis    Type II or unspecified type diabetes mellitus without mention of complication, not stated as uncontrolled    Unspecified hypothyroidism    hypo    Past Surgical History:  Procedure Laterality Date   CARDIOVERSION N/A 06/26/2019   Procedure: CARDIOVERSION;  Surgeon: Jerline Pain, MD;  Location: [facility];  Service: Cardiovascular;  Laterality: N/A;   EYE SURGERY Bilateral 2005   cataracts, implants   KIDNEY STONE SURGERY Bilateral    2016, 2017  knee replaced Left 2013   THYROID SURGERY  1966   nodule removal; partial thyroidectomy; radioactive iodine x2; 1990's    Current Medications: Current Outpatient Medications on File Prior to Visit  Medication Sig   acetaminophen (TYLENOL) 325 MG tablet Take 650 mg by mouth in the morning and at bedtime.   amLODipine (NORVASC)  2.5 MG tablet Take 3 tablets (7.5 mg total) by mouth daily.   amoxicillin (AMOXIL) 500 MG tablet Take 2,000 mg by mouth as needed. Prior to Dental Appt   atorvastatin (LIPITOR) 40 MG tablet TAKE 1 TABLET(40 MG) BY MOUTH AT BEDTIME   BD PEN NEEDLE NANO 2ND GEN 32G X 4 MM MISC USE TO INJECT UP TO FOUR TIMES DAILY AS DIRECTED   bisacodyl (DULCOLAX) 5 MG EC tablet Take 10 mg by mouth every 3 (three) days as needed (for constipation).   Bismuth Tribromoph-Petrolatum (XEROFORM PETROLAT PATCH 4"X4") PADS Apply 1 Units topically daily as needed.   Cholecalciferol (VITAMIN D3) 2000 units TABS Take 5,000 Units by mouth daily.   Continuous Blood Gluc Sensor (FREESTYLE LIBRE 14 DAY SENSOR) MISC APPLY NEW SENSOR EVERY 14 DAYS AS DIRECTED   fexofenadine (ALLEGRA) 180 MG tablet Take 180 mg by mouth daily.   hyoscyamine (LEVSIN) 0.125 MG tablet Take 1 tablet by mouth daily.   insulin glargine (LANTUS SOLOSTAR) 100 UNIT/ML Solostar Pen Inject 46 Units into the skin at bedtime.   insulin lispro (HUMALOG KWIKPEN) 100 UNIT/ML KwikPen INJECT 13 TO 20 UNITS UNDER THE SKIN THREE TIMES DAILY WITH BREAKFAST, LUNCH AND EVENING MEAL   Insulin Syringe-Needle U-100 (INSULIN SYRINGE .3CC/29GX1/2") 29G X 1/2" 0.3 ML MISC Use to inject insulin 4 times a day.   levothyroxine (SYNTHROID) 112 MCG tablet TAKE 1 TABLET(112 MCG) BY MOUTH DAILY   Multiple Vitamin (MULTIVITAMIN) tablet Take 1 tablet by mouth daily.   sertraline (ZOLOFT) 50 MG tablet TAKE 1 TABLET BY MOUTH AT BEDTIME   torsemide (DEMADEX) 20 MG tablet Take 40 mg by mouth every morning.   vitamin B-12 (CYANOCOBALAMIN) 100 MCG tablet Take 100 mcg by mouth daily.   vitamin C (ASCORBIC ACID) 250 MG tablet Take 250 mg by mouth daily.   No current facility-administered medications on file prior to visit.     Allergies:   Adhesive [tape], Apixaban, Aspirin, Mirabegron, Pineapple, Metformin, Tetracycline, Fluticasone-salmeterol, Iodinated diagnostic agents, Lactose  intolerance (gi), Latex, Lisinopril, Metronidazole, Sulfa antibiotics, and Sulfonamide derivatives   Social History   Tobacco Use   Smoking status: Former    Packs/day: 0.25    Years: 20.00    Pack years: 5.00    Types: Cigarettes    Start date: 10    Quit date: 09/07/1989    Years since quitting: 31.7   Smokeless tobacco: Never  Vaping Use   Vaping Use: Never used  Substance Use Topics   Alcohol use: No   Drug use: No    Family History: family history includes Allergies in her mother; Arthritis in her daughter; Asthma in her mother; Clotting disorder in her maternal grandmother, maternal uncle, and mother; Coronary artery disease in an other family member; Depression in her maternal grandfather; Diabetes in her daughter and paternal grandmother; High Cholesterol in her daughter; High blood pressure in her daughter; Hyperlipidemia in her father; Hypertension in her father and mother; Osteoarthritis in her mother; Rheum arthritis in her maternal grandmother; Stroke in her mother and another family member. There is no history of Breast cancer. Mother had a pacemaker, and multiple strokes. Maternal  Uncle had pacemaker and CABG.  ROS:   Please see the history of present illness. All other systems are reviewed and negative.    EKGs/Labs/Other Studies Reviewed:    The following studies were reviewed today:  Monitor 05/16/2021: Patch Wear Time:  7 days and 20 hours   Patient had a min HR of 34 bpm, max HR of 115 bpm, and avg HR of 52 bpm. Predominant underlying rhythm was Junctional Rhythm. 2 Supraventricular Tachycardia runs occurred, the run with the fastest interval lasting 4 beats with a max rate of 115 bpm (avg 113 bpm); the run with the fastest interval was also the longest.  Predominant underlying rhythm was Sinus Rhythm. No VT, atrial fibrillation, high degree block, or pauses noted. Isolated atrial and ventricular ectopy was rare (<1%). There were 0 triggered events. No  significant arrhythmias detected.  DG Chest 05/05/2021: FINDINGS: Stable cardiomegaly. No pneumothorax is noted. Small right pleural effusion is noted with associated right basilar atelectasis or edema. Mild left basilar subsegmental atelectasis or edema is noted. Bony thorax is unremarkable.   IMPRESSION: Small right pleural effusion is noted with associated right basilar atelectasis or edema. Mild left basilar subsegmental atelectasis or edema is noted.   Aortic Atherosclerosis (ICD10-I70.0).  CT Neck 03/06/2021: IMPRESSION: 3 mm calculi in the left submandibular duct causing obstruction and inflammation. There is enlargement of the left submandibular gland with surrounding edema.   Tiny nonobstructing calculi in the right submandibular gland   No adenopathy in the neck.   Bilateral pleural effusions right greater than left.  Echo 01/31/2021: 1. Left ventricular ejection fraction, by estimation, is 60 to 65%. The  left ventricle has normal function. The left ventricle has no regional  wall motion abnormalities. There is moderate concentric left ventricular  hypertrophy. Left ventricular  diastolic function could not be evaluated.   2. Right ventricular systolic function is normal. The right ventricular  size is normal. There is normal pulmonary artery systolic pressure. The  estimated right ventricular systolic pressure is 57.8 mmHg.   3. Left atrial size was moderately dilated.   4. Right atrial size was mildly dilated.   5. A small pericardial effusion is present. The pericardial effusion is  posterior to the left ventricle.   6. The mitral valve is degenerative. Mild mitral valve regurgitation. No  evidence of mitral stenosis.   7. The aortic valve is tricuspid. There is mild calcification of the  aortic valve. There is mild thickening of the aortic valve. Aortic valve  regurgitation is not visualized. Mild aortic valve sclerosis is present,  with no evidence of aortic  valve  stenosis.   8. The inferior vena cava is normal in size with greater than 50%  respiratory variability, suggesting right atrial pressure of 3 mmHg.   Comparison(s): Changes from prior study are noted. The left ventricular  function has improved.   Echo 07/05/2019:  1. Left ventricular ejection fraction, by visual estimation, is 50 to  55%. The left ventricle has normal function. There is no left ventricular  hypertrophy.   2. Elevated left ventricular end-diastolic pressure.   3. Left ventricular diastolic parameters are consistent with Grade I  diastolic dysfunction (impaired relaxation).   4. Global right ventricle has normal systolic function.The right  ventricular size is normal. No increase in right ventricular wall  thickness.   5. Left atrial size was mildly dilated.   6. Right atrial size was normal.   7. Small pericardial effusion.   8. The mitral  valve is normal in structure. Trace mitral valve  regurgitation. No evidence of mitral stenosis.   9. The tricuspid valve is normal in structure. Tricuspid valve  regurgitation is mild.  10. The aortic valve is normal in structure. Aortic valve regurgitation is  trivial. No evidence of aortic valve sclerosis or stenosis.  11. There is Mild calcification of the aortic valve.  12. There is Mild thickening of the aortic valve.  13. The pulmonic valve was normal in structure. Pulmonic valve  regurgitation is not visualized.  14. Moderately elevated pulmonary artery systolic pressure.  15. The inferior vena cava is dilated in size with <50% respiratory  variability, suggesting right atrial pressure of 15 mmHg.   EKG:  EKG is personally reviewed.   05/21/2021: not ordered today 05/01/2021: junctional bradycardia, 42 bpm  Recent Labs: 03/14/2021: B Natriuretic Peptide 111.6 03/15/2021: Magnesium 2.1 03/26/2021: Pro B Natriuretic peptide (BNP) 168.0; TSH 0.41 05/08/2021: ALT 16; BUN 68; Creatinine 2.49; Hemoglobin 10.0; Platelet  Count 101; Potassium 5.3; Sodium 139  Recent Lipid Panel    Component Value Date/Time   CHOL 141 08/14/2020 1017   CHOL 168 07/25/2020 0000   TRIG 334 (H) 08/14/2020 1017   HDL 27 (L) 08/14/2020 1017   HDL 29 (L) 07/25/2020 0000   CHOLHDL 5.2 (H) 08/14/2020 1017   VLDL 118.2 (H) 09/25/2011 1511   LDLCALC 74 08/14/2020 1017   LDLDIRECT 41.0 05/19/2018 1140    Physical Exam:    VS:  BP (!) 146/50 (BP Location: Left Arm, Patient Position: Sitting, Cuff Size: Large)   Pulse (!) 58   Ht $R'5\' 1"'eS$  (1.549 m)   Wt 221 lb (100.2 kg)   LMP  (LMP Unknown)   BMI 41.76 kg/m     Wt Readings from Last 3 Encounters:  05/21/21 221 lb (100.2 kg)  05/16/21 222 lb (100.7 kg)  05/09/21 224 lb 4.8 oz (101.7 kg)    GEN: Well nourished, well developed in no acute distress HEENT: Normal, moist mucous membranes NECK: No JVD CARDIAC: regular rhythm, normal S1 and S2, no rubs or gallops. No murmur. VASCULAR: Radial and DP pulses 2+ bilaterally. No carotid bruits RESPIRATORY:  Clear to auscultation without wheezing or rhonchi. Trivial rales vs atelectasis at bases ABDOMEN: Soft, non-tender, non-distended MUSCULOSKELETAL:  In wheelchair but moves all 4 limbs independently SKIN: Warm and dry, bilateral trace LE edema NEUROLOGIC:  Alert and oriented x 3. No focal neuro deficits noted. PSYCHIATRIC:  Normal affect    ASSESSMENT:    1. Paroxysmal atrial fibrillation (HCC)   2. Chronic venous insufficiency   3. Chronic diastolic heart failure ([place])   4. Type 2 diabetes mellitus with complication, with long-term current use of insulin ([place])   5. Encounter to discuss test results     PLAN:    History of junctional bradycardia on beta blocker History of atrial fibrillation/atrial flutter, with tachycardia -feels much better off of metoprolol. Monitor without significant pauses or tachycardia -CHA2DS2/VAS Stroke Risk Points= at least 7. Reviewed prior discussions re: risk/benefit of anticoagulation. With  chronic thrombocytopenia and history of GI bleeding, decision made previously to not anticoagulate.  Chronic diastolic heart failure, with recent admission for acute on chronic diastolic heart failure Chronic venous stasis Chronic hypoxic respiratory failure, now on home O2 -would be good candidate for Alleviate-HF except that most recent GFR is 19. If GFR rises to >30, would refer her -with chronic kidney disease stage 4, GFR too low for SLGT2i -doing well on torsemide -continue  compression, elevation -vascular ultrasound showed deep vein and superficial vein reflux -discussed monitoring weights  Hypertension; well controlled on 7.5 mg amlodipine. Has history of hypertensive urgency.  Type II diabetes, on insulin: GFR too low for SGLT2i -on atorvastatin -no aspirin given history of bleeding and thrombocytopenia  Plan for follow up: 4-5 months or sooner as needed.   Buford Dresser, MD, PhD, [facility]    Medication Adjustments/Labs and Tests Ordered: Current medicines are reviewed at length with the patient today.  Concerns regarding medicines are outlined above.   No orders of the defined types were placed in this encounter.  No orders of the defined types were placed in this encounter.  Patient Instructions  Medication Instructions:  Your physician recommends that you continue on your current medications as directed. Please refer to the Current Medication list given to you today.  *If you need a refill on your cardiac medications before your next appointment, please call your pharmacy*  Follow-Up: At [facility], you and your health needs are our priority.  As part of our continuing mission to provide you with exceptional heart care, we have created designated Provider Care Teams.  These Care Teams include your primary Cardiologist (physician) and Advanced Practice Providers (APPs -  Physician Assistants and Nurse Practitioners) who all work  together to provide you with the care you need, when you need it.  We recommend signing up for the patient portal called "MyChart".  Sign up information is provided on this After Visit Summary.  MyChart is used to connect with patients for Virtual Visits (Telemedicine).  Patients are able to view lab/test results, encounter notes, upcoming appointments, etc.  Non-urgent messages can be sent to your provider as well.   To learn more about what you can do with MyChart, go to [email or web address].    Your next appointment:   4-5 month(s)  The format for your next appointment:   In Person  Provider:   Buford Dresser, MD     [facility],acting as a scribe for Buford Dresser, MD.,have documented all relevant documentation on the behalf of Buford Dresser, MD,as directed by  Buford Dresser, MD while in the presence of Buford Dresser, MD.  I, Buford Dresser, MD, have reviewed all documentation for this visit. The documentation on 05/21/21 for the exam, diagnosis, procedures, and orders are all accurate and complete.   Signed, Buford Dresser, MD PhD 05/21/2021 3:38 PM    [place]

## 2021-05-23 DIAGNOSIS — N189 Chronic kidney disease, unspecified: Secondary | ICD-10-CM | POA: Diagnosis not present

## 2021-05-23 DIAGNOSIS — N183 Chronic kidney disease, stage 3 unspecified: Secondary | ICD-10-CM | POA: Diagnosis not present

## 2021-05-26 ENCOUNTER — Ambulatory Visit (INDEPENDENT_AMBULATORY_CARE_PROVIDER_SITE_OTHER): Payer: Medicare Other | Admitting: Internal Medicine

## 2021-05-26 ENCOUNTER — Encounter: Payer: Self-pay | Admitting: Internal Medicine

## 2021-05-26 ENCOUNTER — Other Ambulatory Visit: Payer: Self-pay

## 2021-05-26 VITALS — BP 138/78 | HR 53 | Ht 61.0 in | Wt 222.8 lb

## 2021-05-26 DIAGNOSIS — I5030 Unspecified diastolic (congestive) heart failure: Secondary | ICD-10-CM | POA: Diagnosis not present

## 2021-05-26 DIAGNOSIS — N183 Chronic kidney disease, stage 3 unspecified: Secondary | ICD-10-CM | POA: Diagnosis not present

## 2021-05-26 DIAGNOSIS — N184 Chronic kidney disease, stage 4 (severe): Secondary | ICD-10-CM | POA: Diagnosis not present

## 2021-05-26 DIAGNOSIS — N200 Calculus of kidney: Secondary | ICD-10-CM | POA: Diagnosis not present

## 2021-05-26 DIAGNOSIS — I129 Hypertensive chronic kidney disease with stage 1 through stage 4 chronic kidney disease, or unspecified chronic kidney disease: Secondary | ICD-10-CM | POA: Diagnosis not present

## 2021-05-26 DIAGNOSIS — Z794 Long term (current) use of insulin: Secondary | ICD-10-CM | POA: Diagnosis not present

## 2021-05-26 DIAGNOSIS — E890 Postprocedural hypothyroidism: Secondary | ICD-10-CM

## 2021-05-26 DIAGNOSIS — E7800 Pure hypercholesterolemia, unspecified: Secondary | ICD-10-CM

## 2021-05-26 DIAGNOSIS — E1122 Type 2 diabetes mellitus with diabetic chronic kidney disease: Secondary | ICD-10-CM

## 2021-05-26 LAB — POCT GLYCOSYLATED HEMOGLOBIN (HGB A1C): Hemoglobin A1C: 6.8 % — AB (ref 4.0–5.6)

## 2021-05-26 MED ORDER — TRULICITY 0.75 MG/0.5ML ~~LOC~~ SOAJ: 0.7500 mg | SUBCUTANEOUS | 3 refills | Status: DC

## 2021-05-26 NOTE — Patient Instructions (Addendum)
Please continue: - Lantus 46 units daily - Humalog 13-17 units 3x a day before meals  Please rotate the injection sites as discussed.  Please start Trulicity 0.98 mg weekly. Let me know when you are close to running out to call in the higher dose to your pharmacy (1.5 mg).  Please continue levothyroxine 112 mcg daily  Take the thyroid hormone every day, with water, at least 30 minutes before breakfast, separated by at least 4 hours from: - acid reflux medications - calcium - iron - multivitamins  Please return in 4 months with your freestyle libre CGM.

## 2021-05-26 NOTE — Progress Notes (Signed)
Patient ID: [name], female   DOB: 1935-02-07, 86 y.o.   MRN: [id number]   This visit occurred during the SARS-CoV-2 public health emergency.  Safety protocols were in place, including screening questions prior to the visit, additional usage of staff PPE, and extensive cleaning of exam room while observing appropriate contact time as indicated for disinfecting solutions.   HPI: [name] is a 86 y.o.-year-old female, returning for f/u for DM2, dx in late 1990s, insulin-dependent since 2012, uncontrolled, with complications (CKD stage 4, CHF, + DR) and hypothyroidism. She prev. Saw Dr. Chalmers Cater.  Last visit with me 4 months ago.  She is accompanied by her daughter who offers part of the history including about insulin doses, diet, blood sugars.  Interim history: She was on the Noom diet and also Pilates twice a week. She stopped both since last visit.  Before last visit, she moved from her daughter's house to her own apartment and she was feeling better, with less pressure from the family. She has been in the hospital 2x for CHF exacerbation since then. She has leg swelling and her legs are wrapped.  DM2:  Reviewed HbA1c levels: Lab Results  Component Value Date   HGBA1C 6.9 (H) 03/26/2021   HGBA1C 7.1 (A) 01/17/2021   HGBA1C 6.2 (A) 09/19/2020  12/11/2018: HbA1c 6.9% 08/19/2017: HbA1c calculated from fructosamine is 6.7%  She is on:  - Lantus 46 >> 42 >> 46 units   - Humalog 13-17 units 3x a day before meals  She checks her sugars more than 4 times a day with her freestyle libre CGM:   Previously:   Previously:   Previously:   Lowest sugar was 295 >> ... 54 (Libre) >> 80 >> 70s >> 100; she has hypoglycemia awareness at 100. Highest sugar was 559 >> ... >> 500 (steroids) >> upper 200s.  Glucometer: True Metrix air >> freestyle libre CGM  -+ Stage IV CKD, last BUN/creatinine:  Lab Results  Component Value Date   BUN 68 (H) 05/08/2021   BUN 83 (H)  03/26/2021   CREATININE 2.49 (H) 05/08/2021   CREATININE 2.44 (H) 03/26/2021  03/15/2018: 47/2.34, GFR 19, Glu 283  Reviewed her GFR levels: Lab Results  Component Value Date   GFRNONAA 19 (L) 05/08/2021   GFRNONAA 22 (L) 03/18/2021   GFRNONAA 21 (L) 03/17/2021   GFRNONAA 21 (L) 03/16/2021   GFRNONAA 22 (L) 03/15/2021   GFRNONAA 21 (L) 03/14/2021   GFRNONAA 22 (L) 03/13/2021   GFRNONAA 26 (L) 02/01/2021   GFRNONAA 26 (L) 01/31/2021   GFRNONAA 27 (L) 01/30/2021  He has a h/o uric acid kidney stones.  -+ HL; last set of lipids: Lab Results  Component Value Date   CHOL 141 08/14/2020   HDL 27 (L) 08/14/2020   LDLCALC 74 08/14/2020   LDLDIRECT 41.0 05/19/2018   TRIG 334 (H) 08/14/2020   CHOLHDL 5.2 (H) 08/14/2020  On atorvastatin 40, fenofibrate 45, .  - last eye exam was in 03/2021: No DR, previously + DR  -She has numbness and tingling in her toes  She has a h/o urinary incontinence >> started Myrbetriq >> developed A fib >> started Eliquis >> had bleeding >> received blood transfusion.  Postsurgical (1960s) and postablative (1977 and 1999) hypothyroidism   Pt is on levothyroxine 112 mcg daily, taken: - in am - fasting - at least 30 min from b'fast - no calcium - no iron - no multivitamins - now off PPIs - not on  Biotin  Her TFTs were normal: Lab Results  Component Value Date   TSH 0.41 03/26/2021   TSH 0.742 01/17/2021   TSH 0.544 07/25/2020   TSH 0.62 05/21/2020   TSH 0.355 07/20/2019   TSH 0.838 07/04/2019   TSH 1.64 02/22/2019   TSH 0.92 05/19/2018   TSH 1.82 12/15/2016   TSH 0.87 09/11/2015   She had a mild MI at the beginning of 2019 and she also has CHF.  She does have chronic evaluation for cytopenia and anemia.  ROS: + see HPI  I reviewed pt's medications, allergies, PMH, social hx, family hx, and changes were documented in the history of present illness. Otherwise, unchanged from my initial visit note.  Past Medical History:  Diagnosis  Date   Allergic rhinitis    Anxiety state, unspecified    panic attacks   CHF (congestive heart failure) (HCC)    Depressive disorder, not elsewhere classified    Disorientation, unspecified 08/07/2019   Epistaxis 03/24/2018   Extrinsic asthma, unspecified    no problem since adulthood   History of complications due to general anesthesia 03/03/2021   History of pulmonary embolism 08/07/2019   Hypertensive urgency 01/30/2021   Obesity    OSA on CPAP    severe   Pneumonitis 07/14/2019   Pressure injury of skin 07/10/2019   Pulmonary embolism ([place])    Pure hypercholesterolemia    Respiratory failure with hypoxia ([place]) 09/2008   acute, secondary to multiple bilateral pulmonary embolism , negative hypercoagulable workup 09/2008 hospital stay   Scoliosis    Type II or unspecified type diabetes mellitus without mention of complication, not stated as uncontrolled    Unspecified hypothyroidism    hypo   Past Surgical History:  Procedure Laterality Date   CARDIOVERSION N/A 06/26/2019   Procedure: CARDIOVERSION;  Surgeon: Jerline Pain, MD;  Location: [facility];  Service: Cardiovascular;  Laterality: N/A;   EYE SURGERY Bilateral 2005   cataracts, implants   KIDNEY STONE SURGERY Bilateral    2016, 2017    knee replaced Left 2013   THYROID SURGERY  1966   nodule removal; partial thyroidectomy; radioactive iodine x2; 1990's   Social History   Social History   Marital status: Widowed    Spouse name: N/A   Number of children: 2   Occupational History   OWNER Acupuncturist employed- runs [employer]   Social History Main Topics   Smoking status: Former Smoker    Years: 20.00    Quit date: 09/07/1989   Smokeless tobacco: Never Used   Alcohol use No   Drug use: No   Current Outpatient Medications on File Prior to Visit  Medication Sig Dispense Refill   acetaminophen (TYLENOL) 325 MG tablet Take 650 mg by mouth in the morning and at bedtime.     amLODipine (NORVASC) 2.5  MG tablet Take 3 tablets (7.5 mg total) by mouth daily. 270 tablet 1   amoxicillin (AMOXIL) 500 MG tablet Take 2,000 mg by mouth as needed. Prior to Dental Appt     atorvastatin (LIPITOR) 40 MG tablet TAKE 1 TABLET(40 MG) BY MOUTH AT BEDTIME 90 tablet 1   BD PEN NEEDLE NANO 2ND GEN 32G X 4 MM MISC USE TO INJECT UP TO FOUR TIMES DAILY AS DIRECTED 200 each 3   bisacodyl (DULCOLAX) 5 MG EC tablet Take 10 mg by mouth every 3 (three) days as needed (for constipation).     Bismuth Tribromoph-Petrolatum (XEROFORM PETROLAT  PATCH 4"X4") PADS Apply 1 Units topically daily as needed. 75 each 5   Cholecalciferol (VITAMIN D3) 2000 units TABS Take 5,000 Units by mouth daily.     Continuous Blood Gluc Sensor (FREESTYLE LIBRE 14 DAY SENSOR) MISC APPLY NEW SENSOR EVERY 14 DAYS AS DIRECTED 6 each 3   fexofenadine (ALLEGRA) 180 MG tablet Take 180 mg by mouth daily.     hyoscyamine (LEVSIN) 0.125 MG tablet Take 1 tablet by mouth daily.     insulin glargine (LANTUS SOLOSTAR) 100 UNIT/ML Solostar Pen Inject 46 Units into the skin at bedtime.     insulin lispro (HUMALOG KWIKPEN) 100 UNIT/ML KwikPen INJECT 13 TO 20 UNITS UNDER THE SKIN THREE TIMES DAILY WITH BREAKFAST, LUNCH AND EVENING MEAL 30 mL 1   Insulin Syringe-Needle U-100 (INSULIN SYRINGE .3CC/29GX1/2") 29G X 1/2" 0.3 ML MISC Use to inject insulin 4 times a day. 400 each 3   levothyroxine (SYNTHROID) 112 MCG tablet TAKE 1 TABLET(112 MCG) BY MOUTH DAILY 90 tablet 1   Multiple Vitamin (MULTIVITAMIN) tablet Take 1 tablet by mouth daily.     sertraline (ZOLOFT) 50 MG tablet TAKE 1 TABLET BY MOUTH AT BEDTIME 90 tablet 1   torsemide (DEMADEX) 20 MG tablet Take 40 mg by mouth every morning.     vitamin B-12 (CYANOCOBALAMIN) 100 MCG tablet Take 100 mcg by mouth daily.     vitamin C (ASCORBIC ACID) 250 MG tablet Take 250 mg by mouth daily.     No current facility-administered medications on file prior to visit.   Allergies  Allergen Reactions   Adhesive [Tape] Other  (See Comments)    PULLS OFF THE SKIN   Apixaban Other (See Comments)    Internal Bleeding   Aspirin Itching, Rash, Hives and Swelling    Swelling of her tongue   Mirabegron Other (See Comments)    Patient experienced A-Fib   Pineapple Anaphylaxis and Swelling    Throat swells and blisters on tongue and roof of mouth per patient   Metformin Diarrhea and Nausea Only   Tetracycline Hives   Fluticasone-Salmeterol Itching and Rash   Iodinated Diagnostic Agents Itching and Rash        Lactose Intolerance (Gi) Other (See Comments)    Gas    Latex Itching, Rash and Other (See Comments)    Pulls off the skin and causes welts   Lisinopril Cough   Metronidazole Other (See Comments)    Reaction not known   Sulfa Antibiotics Rash   Sulfonamide Derivatives Itching and Rash   Family History  Problem Relation Age of Onset   Hypertension Father    Hyperlipidemia Father    Allergies Mother    Clotting disorder Mother    Osteoarthritis Mother    Asthma Mother    Hypertension Mother    Stroke Mother    Coronary artery disease Other    Stroke Other    Rheum arthritis Maternal Grandmother    Clotting disorder Maternal Grandmother    Clotting disorder Maternal Uncle    Arthritis Daughter    Diabetes Daughter    High blood pressure Daughter    High Cholesterol Daughter    Depression Maternal Grandfather    Diabetes Paternal Grandmother    Breast cancer Neg Hx    PE: BP 138/78 (BP Location: Left Arm, Patient Position: Sitting, Cuff Size: Normal)   Pulse (!) 53   Ht 5\' 1"  (1.549 m)   Wt 222 lb 12.8 oz (101.1 kg)   LMP  (  LMP Unknown)   SpO2 91%   BMI 42.10 kg/m  Wt Readings from Last 3 Encounters:  05/26/21 222 lb 12.8 oz (101.1 kg)  05/21/21 221 lb (100.2 kg)  05/16/21 222 lb (100.7 kg)   Constitutional: overweight, in NAD Eyes: PERRLA, EOMI, no exophthalmos ENT: moist mucous membranes, no thyromegaly, no cervical lymphadenopathy Cardiovascular: irreg. irreg.RR, No MRG, + BLE  edema Respiratory: CTA B Gastrointestinal: abdomen soft, NT, ND, BS+ Musculoskeletal: no deformities, strength intact in all 4 Skin: moist, warm, no rashes Neurological: no tremor with outstretched hands, DTR normal in all 4  ASSESSMENT: 1. DM2, insulin-dependent, uncontrolled, with complications - CKD stage 4 - CHF - + DR  2. Hypothyroidism  3. Hyperlipidemia  PLAN:  1. Patient with   longstanding, uncontrolled, type 2 diabetes, on basal/bolus insulin regimen.  She was previously on a GLP-1 receptor agonist but this was held while residing in [facility] facility and she was not eating well.  She lost a significant amount of weight in 2019, approximately 40 pounds.  Therefore, we continued without a GLP 1 receptor agonist at the previous visits.  At last visit, HbA1c was excellent, at 6.2%.  We decreased her dose of Lantus at that time as she was on a new diet (Noom) also exercising more frequently.  At last visit, we had to go up on the Lantus dose, though his sugars were not higher throughout the day.  However, she stopped Noom since last visit.  She is trying to eat less processed diet.  She is not exercising. CGM interpretation: -At today's visit, we reviewed her CGM downloads: It appears that 43% of values are in target range (goal >70%), while 57% are higher than 180 (goal <25%), and 0% are lower than 70 (goal <4%).  The calculated average blood sugar is 191.  The projected HbA1c for the next 3 months ([facility]) is 7.9%. -Reviewing the CGM trends, it appears that her sugars fluctuate around the 180 level and increasing significantly after her first meal and her last meal of the day.  She is not missing insulin doses.  It almost appears that she is not absorbing the insulin that she is injecting.  Upon questioning, she is injecting in the same region of the abdomen so we discussed that she may have scarring in this area.  I advised her to move the injections to the other half of the  abdomen and also to use the thighs.  She does mention that when her caregivers are chronic they are giving her insulin injections in her arms.  This is probably ok for the fast acting insulin. -At this visit, we discussed about possibly restarting Trulicity.  She would like to try this as she does remember that sugars were much better at that time, to the point of lows.  We did discuss that we may need to reduce the doses of her insulin after starting Trulicity. - I suggested to:  Patient Instructions  Please continue: - Lantus 46 units daily - Humalog 13-17 units 3x a day before meals  Please rotate the injection sites as discussed.  Please start Trulicity 7.51 mg weekly. Let me know when you are close to running out to call in the higher dose to your pharmacy (1.5 mg).  Please continue levothyroxine 112 mcg daily  Take the thyroid hormone every day, with water, at least 30 minutes before breakfast, separated by at least 4 hours from: - acid reflux medications - calcium - iron - multivitamins  Please return in 4 months with your freestyle libre CGM.  - we checked her HbA1c: 6.8% (slightly better) - advised to check sugars at different times of the day - 4x a day, rotating check times - advised for yearly eye exams >> she is UTD - return to clinic in 4 months  2. HL -Reviewed latest lipid panel from 08/2020: LDL close to goal, triglycerides high, HDL low: Lab Results  Component Value Date   CHOL 141 08/14/2020   HDL 27 (L) 08/14/2020   LDLCALC 74 08/14/2020   LDLDIRECT 41.0 05/19/2018   TRIG 334 (H) 08/14/2020   CHOLHDL 5.2 (H) 08/14/2020  -She continues on fenofibrate 45 mg daily and atorvastatin 40 mg daily without side effects.  3.  Hypothyroidism  - latest thyroid labs reviewed with pt. >> normal: Lab Results  Component Value Date   TSH 0.41 03/26/2021  - she continues on LT4 112 mcg daily - pt feels good on this dose. - we discussed about taking the thyroid hormone  every day, with water, >30 minutes before breakfast, separated by >4 hours from acid reflux medications, calcium, iron, multivitamins. Pt. is taking it correctly.  Philemon Kingdom, MD PhD [facility]

## 2021-05-30 ENCOUNTER — Encounter: Payer: Self-pay | Admitting: Internal Medicine

## 2021-06-04 ENCOUNTER — Telehealth: Payer: Self-pay | Admitting: *Deleted

## 2021-06-04 NOTE — Telephone Encounter (Signed)
Left VM for Brad with [employer] regarding new CPAP for patient.  Need our office linked to her new machine in airview.  Both of the one's in airview says >1 year for the data.  Will await return call.

## 2021-06-04 NOTE — Telephone Encounter (Signed)
I was on the phone with [name] and he reports that the patient did not want to be set up for airview at this time.   I called the patient and she reports that she will need a new machine since it made her real sick. She was told that Dr. Elsworth Soho is taking care of her and she had been in ICU a few months back and she feels that the machine was what made her sick. She wants a new machine from the DME company.

## 2021-06-04 NOTE — Telephone Encounter (Signed)
Brad from [employer] is returning phone call. Brad phone number is [phone]. °

## 2021-06-05 ENCOUNTER — Other Ambulatory Visit: Payer: Self-pay

## 2021-06-05 ENCOUNTER — Encounter: Payer: Self-pay | Admitting: Adult Health

## 2021-06-05 ENCOUNTER — Ambulatory Visit (INDEPENDENT_AMBULATORY_CARE_PROVIDER_SITE_OTHER): Payer: Medicare Other | Admitting: Adult Health

## 2021-06-05 DIAGNOSIS — G4733 Obstructive sleep apnea (adult) (pediatric): Secondary | ICD-10-CM | POA: Diagnosis not present

## 2021-06-05 DIAGNOSIS — J9611 Chronic respiratory failure with hypoxia: Secondary | ICD-10-CM

## 2021-06-05 DIAGNOSIS — I5033 Acute on chronic diastolic (congestive) heart failure: Secondary | ICD-10-CM

## 2021-06-05 NOTE — Patient Instructions (Addendum)
Will check on order for new CPAP machine .  Once you have the CPAP, wear CPAP all night long  Do not drive if sleepy  Work on healthy weight .   Continue on Oxygen 2l/m as needed with activity .   Continue on current regimen   Follow up with Dr. Elsworth Soho  in 3 months and As needed

## 2021-06-05 NOTE — Progress Notes (Signed)
@Patient  ID: [name], female    DOB: 1935-04-29, 86 y.o.   MRN: [id number]  Chief Complaint  Patient presents with   Follow-up    F/U for OSA and SOB. Reports her breathing has significantly improved. Reports she is now only using her oxygen as needed.     Referring provider: Caren Macadam, MD  HPI: 86 year old female former smoker followed for severe obstructive sleep apnea on nocturnal CPAP Previous admission November 2020 with acute respiratory failure with hypoxemia, bilateral infiltrates consistent with pneumonitis that were responsive to steroids Medical history significant for chronic kidney disease, ITP, A. fib on Eliquis and a history of PE in 2010  TEST/EVENTS :  Diagnostic PSG in 8/09 showed severe obstructive sleep apnea with AHI 85/h, TST 121 mins - no REM sleep. Subsequent titration study showed CPAP requirement of 15 cm with small comfort gel mask  HST 02/2021 showed mod OSA with AHI 15/ hr    NPSG in 04/2008 showed severe obstructive sleep apnea with AHI 85/h, TST 121 mins - no REM sleep. Subsequent titration study showed CPAP requirement of 15 cm with small comfort gel mask   07/2019 ABG 7.4 5/61/54  01/2021 VQ scan negative   04/07/21 CPAP titration study- Optimal PAP pressure 12cm h20. Min O2 at optimal pressure 90%. AHI at optimal pressure 0.3/hr.    06/05/2021 Follow up ; OSA, CHF  Patient presents for a 1 month follow-up.  Patient has underlying obstructive sleep apnea.  Patient was recently set up for a CPAP titration study that was done in August 2022.  This showed optimal PAP pressure at 12 cm H2O.  Last visit patient was recommended to restart CPAP.  Patient has a CPAP machine that was provided in 2019.  Unfortunately patient became acutely ill November 2020 with pneumonitis.  She is convinced that her CPAP machine cause this.  And she has not used her CPAP machine since then.  She is requesting a new CPAP machine.  An order was sent to her  homecare company last visit.  We are following up on the status.   Patient was  admitted 03/2021 to the hospital for decompensated diastolic heart failure.  She improved with diuresis.  She did require oxygen with activity.  Since last office visit patient says she is doing better.  Her breathing has improved she has not had to use her oxygen as needed . Marland KitchenUses 2l/m, with activity . Not used Oxygen in last 3 days . Remains on Demadex 40mg  daily .  Patient says she is feeling much better.  She is breathing better and has improved activity tolerance.  She denies any increased leg swelling.  Says her ankle edema has been decreased.   Allergies  Allergen Reactions   Adhesive [Tape] Other (See Comments)    PULLS OFF THE SKIN   Apixaban Other (See Comments)    Internal Bleeding   Aspirin Itching, Rash, Hives and Swelling    Swelling of her tongue   Mirabegron Other (See Comments)    Patient experienced A-Fib   Pineapple Anaphylaxis and Swelling    Throat swells and blisters on tongue and roof of mouth per patient   Metformin Diarrhea and Nausea Only   Tetracycline Hives   Fluticasone-Salmeterol Itching and Rash   Iodinated Diagnostic Agents Itching and Rash        Lactose Intolerance (Gi) Other (See Comments)    Gas    Latex Itching, Rash and Other (See Comments)  Pulls off the skin and causes welts   Lisinopril Cough   Metronidazole Other (See Comments)    Reaction not known   Sulfa Antibiotics Rash   Sulfonamide Derivatives Itching and Rash    Immunization History  Administered Date(s) Administered   Fluad Quad(high Dose 65+) 05/27/2020, 05/09/2021   Influenza Split 06/09/2011   Influenza Whole 07/23/2008, 06/13/2009, 06/07/2012   Influenza, High Dose Seasonal PF 05/08/2015, 05/19/2018   Moderna Sars-Covid-2 Vaccination 09/18/2019, 10/16/2019, 09/10/2020   Pneumococcal Conjugate-13 12/15/2016   Pneumococcal Polysaccharide-23 01/30/2008   Pneumococcal-Unspecified 09/07/2013    Td 01/25/2006   Zoster, Live 06/09/2011    Past Medical History:  Diagnosis Date   Allergic rhinitis    Anxiety state, unspecified    panic attacks   CHF (congestive heart failure) (HCC)    Depressive disorder, not elsewhere classified    Disorientation, unspecified 08/07/2019   Epistaxis 03/24/2018   Extrinsic asthma, unspecified    no problem since adulthood   History of complications due to general anesthesia 03/03/2021   History of pulmonary embolism 08/07/2019   Hypertensive urgency 01/30/2021   Obesity    OSA on CPAP    severe   Pneumonitis 07/14/2019   Pressure injury of skin 07/10/2019   Pulmonary embolism ([place])    Pure hypercholesterolemia    Respiratory failure with hypoxia ([place]) 09/2008   acute, secondary to multiple bilateral pulmonary embolism , negative hypercoagulable workup 09/2008 hospital stay   Scoliosis    Type II or unspecified type diabetes mellitus without mention of complication, not stated as uncontrolled    Unspecified hypothyroidism    hypo    Tobacco History: Social History   Tobacco Use  Smoking Status Former   Packs/day: 0.25   Years: 20.00   Pack years: 5.00   Types: Cigarettes   Start date: 45   Quit date: 09/07/1989   Years since quitting: 31.7  Smokeless Tobacco Never   Counseling given: Not Answered   Outpatient Medications Prior to Visit  Medication Sig Dispense Refill   acetaminophen (TYLENOL) 325 MG tablet Take 650 mg by mouth in the morning and at bedtime.     amLODipine (NORVASC) 2.5 MG tablet Take 3 tablets (7.5 mg total) by mouth daily. 270 tablet 1   amoxicillin (AMOXIL) 500 MG tablet Take 2,000 mg by mouth as needed. Prior to Dental Appt     atorvastatin (LIPITOR) 40 MG tablet TAKE 1 TABLET(40 MG) BY MOUTH AT BEDTIME 90 tablet 1   BD PEN NEEDLE NANO 2ND GEN 32G X 4 MM MISC USE TO INJECT UP TO FOUR TIMES DAILY AS DIRECTED 200 each 3   bisacodyl (DULCOLAX) 5 MG EC tablet Take 10 mg by mouth every 3 (three) days as needed (for  constipation).     Bismuth Tribromoph-Petrolatum (XEROFORM PETROLAT PATCH 4"X4") PADS Apply 1 Units topically daily as needed. 75 each 5   Cholecalciferol (VITAMIN D3) 2000 units TABS Take 5,000 Units by mouth daily.     Continuous Blood Gluc Sensor (FREESTYLE LIBRE 14 DAY SENSOR) MISC APPLY NEW SENSOR EVERY 14 DAYS AS DIRECTED 6 each 3   Dulaglutide (TRULICITY) 3.15 VV/6.1YW SOPN Inject 0.75 mg into the skin once a week. 2 mL 3   fexofenadine (ALLEGRA) 180 MG tablet Take 180 mg by mouth daily.     hyoscyamine (LEVSIN) 0.125 MG tablet Take 1 tablet by mouth daily.     insulin glargine (LANTUS SOLOSTAR) 100 UNIT/ML Solostar Pen Inject 46 Units into the skin at bedtime.  insulin lispro (HUMALOG KWIKPEN) 100 UNIT/ML KwikPen INJECT 13 TO 20 UNITS UNDER THE SKIN THREE TIMES DAILY WITH BREAKFAST, LUNCH AND EVENING MEAL 30 mL 1   Insulin Syringe-Needle U-100 (INSULIN SYRINGE .3CC/29GX1/2") 29G X 1/2" 0.3 ML MISC Use to inject insulin 4 times a day. 400 each 3   levothyroxine (SYNTHROID) 112 MCG tablet TAKE 1 TABLET(112 MCG) BY MOUTH DAILY 90 tablet 1   Multiple Vitamin (MULTIVITAMIN) tablet Take 1 tablet by mouth daily.     sertraline (ZOLOFT) 50 MG tablet TAKE 1 TABLET BY MOUTH AT BEDTIME 90 tablet 1   torsemide (DEMADEX) 20 MG tablet Take 40 mg by mouth every morning.     vitamin B-12 (CYANOCOBALAMIN) 100 MCG tablet Take 100 mcg by mouth daily.     vitamin C (ASCORBIC ACID) 250 MG tablet Take 250 mg by mouth daily.     No facility-administered medications prior to visit.     Review of Systems:   Constitutional:   No  weight loss, night sweats,  Fevers, chills,  +fatigue, or  lassitude.  HEENT:   No headaches,  Difficulty swallowing,  Tooth/dental problems, or  Sore throat,                No sneezing, itching, ear ache, nasal congestion, post nasal drip,   CV:  No chest pain,  Orthopnea, PND, +swelling in lower extremities, no anasarca, dizziness, palpitations, syncope.   GI  No  heartburn, indigestion, abdominal pain, nausea, vomiting, diarrhea, change in bowel habits, loss of appetite, bloody stools.   Resp:  No chest wall deformity  Skin: no rash or lesions.  GU: no dysuria, change in color of urine, no urgency or frequency.  No flank pain, no hematuria   MS:  No joint pain or swelling.  No decreased range of motion.  No back pain.    Physical Exam  BP 132/68 (BP Location: Left Arm, Patient Position: Sitting, Cuff Size: Large)   Pulse 64   Temp 97.7 F (36.5 C) (Oral)   Resp 17   Ht 5\' 1"  (4.174 m)   Wt 221 lb 12.8 oz (100.6 kg)   LMP  (LMP Unknown)   SpO2 96%   BMI 41.91 kg/m   GEN: A/Ox3; pleasant , NAD, BMI 41, wheelchair   HEENT:  [place]/AT,   NOSE-clear, THROAT-clear, no lesions, no postnasal drip or exudate noted.  Class III MP airway  NECK:  Supple w/ fair ROM; no JVD; normal carotid impulses w/o bruits; no thyromegaly or nodules palpated; no lymphadenopathy.    RESP  Clear  P & A; w/o, wheezes/ rales/ or rhonchi. no accessory muscle use, no dullness to percussion  CARD:  RRR, no m/r/g, 1+ peripheral edema, pulses intact, no cyanosis or clubbing.  GI:   Soft & nt; nml bowel sounds; no organomegaly or masses detected.   Musco: Warm bil, no deformities or joint swelling noted.   Neuro: alert, no focal deficits noted.    Skin: Warm, no lesions or rashes    Lab Results:    Imaging: LONG TERM MONITOR-LIVE TELEMETRY (3-14 DAYS)  Result Date: 05/21/2021 Patch Wear Time:  7 days and 20 hours Patient had a min HR of 34 bpm, max HR of 115 bpm, and avg HR of 52 bpm. Predominant underlying rhythm was Junctional Rhythm. 2 Supraventricular Tachycardia runs occurred, the run with the fastest interval lasting 4 beats with a max rate of 115 bpm (avg 113 bpm); the run with the fastest interval was  also the longest.  Predominant underlying rhythm was Sinus Rhythm. No VT, atrial fibrillation, high degree block, or pauses noted. Isolated atrial and  ventricular ectopy was rare (<1%). There were 0 triggered events. No significant arrhythmias detected.   romiPLOStim (NPLATE) injection 100 mcg     Date Action Dose Route User   05/08/2021 1341 Given 100 mcg Subcutaneous (Left Arm) Markoski, McKenzie, LPN       No flowsheet data found.  No results found for: NITRICOXIDE      Assessment & Plan:   Sleep apnea Sleep apnea.  Patient needs to restart her CPAP at bedtime.  We will contact her homecare company to check on status of her new machine.   Plan  Patient Instructions  Will check on order for new CPAP machine .  Once you have the CPAP, wear CPAP all night long  Do not drive if sleepy  Work on healthy weight .   Continue on Oxygen 2l/m as needed with activity .   Continue on current regimen   Follow up with Dr. Elsworth Soho  in 3 months and As needed        Acute on chronic diastolic CHF (congestive heart failure) ([place]) Recent decompensation now improved.  Patient appears euvolemic on exam.  Continue on her current regimen.  Plan  Patient Instructions  Will check on order for new CPAP machine .  Once you have the CPAP, wear CPAP all night long  Do not drive if sleepy  Work on healthy weight .   Continue on Oxygen 2l/m as needed with activity .   Continue on current regimen   Follow up with Dr. Elsworth Soho  in 3 months and As needed        Chronic respiratory failure with hypoxia ([place]) Continue on oxygen with activity as needed O2 saturation goal greater than 88 to 90%     Rexene Edison, NP 06/05/2021

## 2021-06-05 NOTE — Assessment & Plan Note (Signed)
Continue on oxygen with activity as needed O2 saturation goal greater than 88 to 90%

## 2021-06-05 NOTE — Assessment & Plan Note (Signed)
Sleep apnea.  Patient needs to restart her CPAP at bedtime.  We will contact her homecare company to check on status of her new machine.   Plan  Patient Instructions  Will check on order for new CPAP machine .  Once you have the CPAP, wear CPAP all night long  Do not drive if sleepy  Work on healthy weight .   Continue on Oxygen 2l/m as needed with activity .   Continue on current regimen   Follow up with Dr. Elsworth Soho  in 3 months and As needed

## 2021-06-05 NOTE — Assessment & Plan Note (Signed)
Recent decompensation now improved.  Patient appears euvolemic on exam.  Continue on her current regimen.  Plan  Patient Instructions  Will check on order for new CPAP machine .  Once you have the CPAP, wear CPAP all night long  Do not drive if sleepy  Work on healthy weight .   Continue on Oxygen 2l/m as needed with activity .   Continue on current regimen   Follow up with Dr. Elsworth Soho  in 3 months and As needed

## 2021-06-11 ENCOUNTER — Ambulatory Visit: Payer: Medicare Other | Admitting: Pulmonary Disease

## 2021-06-12 ENCOUNTER — Inpatient Hospital Stay: Payer: Medicare Other

## 2021-06-12 ENCOUNTER — Inpatient Hospital Stay (HOSPITAL_BASED_OUTPATIENT_CLINIC_OR_DEPARTMENT_OTHER): Payer: Medicare Other | Admitting: Hematology & Oncology

## 2021-06-12 ENCOUNTER — Ambulatory Visit (HOSPITAL_BASED_OUTPATIENT_CLINIC_OR_DEPARTMENT_OTHER): Payer: Medicare Other | Admitting: Cardiology

## 2021-06-12 ENCOUNTER — Telehealth: Payer: Self-pay | Admitting: *Deleted

## 2021-06-12 ENCOUNTER — Other Ambulatory Visit: Payer: Self-pay

## 2021-06-12 ENCOUNTER — Inpatient Hospital Stay: Payer: Medicare Other | Admitting: Family

## 2021-06-12 ENCOUNTER — Encounter: Payer: Self-pay | Admitting: Hematology & Oncology

## 2021-06-12 ENCOUNTER — Inpatient Hospital Stay: Payer: Medicare Other | Attending: Hematology & Oncology

## 2021-06-12 VITALS — BP 148/44 | HR 50 | Temp 97.6°F | Resp 18 | Ht 61.0 in | Wt 222.1 lb

## 2021-06-12 DIAGNOSIS — D693 Immune thrombocytopenic purpura: Secondary | ICD-10-CM

## 2021-06-12 DIAGNOSIS — D509 Iron deficiency anemia, unspecified: Secondary | ICD-10-CM | POA: Diagnosis not present

## 2021-06-12 LAB — CMP (CANCER CENTER ONLY)
ALT: 13 U/L (ref 0–44)
AST: 15 U/L (ref 15–41)
Albumin: 4.2 g/dL (ref 3.5–5.0)
Alkaline Phosphatase: 66 U/L (ref 38–126)
Anion gap: 9 (ref 5–15)
BUN: 59 mg/dL — ABNORMAL HIGH (ref 8–23)
CO2: 32 mmol/L (ref 22–32)
Calcium: 10 mg/dL (ref 8.9–10.3)
Chloride: 99 mmol/L (ref 98–111)
Creatinine: 2.44 mg/dL — ABNORMAL HIGH (ref 0.44–1.00)
GFR, Estimated: 19 mL/min — ABNORMAL LOW (ref >60)
Glucose, Bld: 139 mg/dL — ABNORMAL HIGH (ref 70–99)
Potassium: 4.8 mmol/L (ref 3.5–5.1)
Sodium: 140 mmol/L (ref 135–145)
Total Bilirubin: 0.5 mg/dL (ref 0.3–1.2)
Total Protein: 7.1 g/dL (ref 6.5–8.1)

## 2021-06-12 LAB — CBC WITH DIFFERENTIAL (CANCER CENTER ONLY)
Abs Immature Granulocytes: 0.02 10*3/uL (ref 0.00–0.07)
Basophils Absolute: 0 10*3/uL (ref 0.0–0.1)
Basophils Relative: 0 %
Eosinophils Absolute: 0.2 10*3/uL (ref 0.0–0.5)
Eosinophils Relative: 4 %
HCT: 33.3 % — ABNORMAL LOW (ref 36.0–46.0)
Hemoglobin: 10.6 g/dL — ABNORMAL LOW (ref 12.0–15.0)
Immature Granulocytes: 0 %
Lymphocytes Relative: 18 %
Lymphs Abs: 1 10*3/uL (ref 0.7–4.0)
MCH: 30.3 pg (ref 26.0–34.0)
MCHC: 31.8 g/dL (ref 30.0–36.0)
MCV: 95.1 fL (ref 80.0–100.0)
Monocytes Absolute: 0.4 10*3/uL (ref 0.1–1.0)
Monocytes Relative: 7 %
Neutro Abs: 3.8 10*3/uL (ref 1.7–7.7)
Neutrophils Relative %: 71 %
Platelet Count: 115 10*3/uL — ABNORMAL LOW (ref 150–400)
RBC: 3.5 MIL/uL — ABNORMAL LOW (ref 3.87–5.11)
RDW: 15.9 % — ABNORMAL HIGH (ref 11.5–15.5)
WBC Count: 5.4 10*3/uL (ref 4.0–10.5)
nRBC: 0 % (ref 0.0–0.2)

## 2021-06-12 LAB — RETICULOCYTES
Immature Retic Fract: 16.9 % — ABNORMAL HIGH (ref 2.3–15.9)
RBC.: 3.42 MIL/uL — ABNORMAL LOW (ref 3.87–5.11)
Retic Count, Absolute: 70.5 10*3/uL (ref 19.0–186.0)
Retic Ct Pct: 2.1 % (ref 0.4–3.1)

## 2021-06-12 LAB — SAVE SMEAR(SSMR), FOR PROVIDER SLIDE REVIEW

## 2021-06-12 MED ORDER — ROMIPLOSTIM 125 MCG ~~LOC~~ SOLR
1.0000 ug/kg | Freq: Once | SUBCUTANEOUS | Status: AC
  Administered 2021-06-12: 100 ug via SUBCUTANEOUS
  Filled 2021-06-12: qty 0.2

## 2021-06-12 NOTE — Progress Notes (Signed)
Hematology and Oncology Follow Up Visit  [name] [id number] 05/03/1935 86 y.o. 06/12/2021   Principle Diagnosis:  Immune based thrombocytopenia History of PE Iron deficiency anemia Atrial fib   Current Therapy:        Nplate q 3 week for platelet count < 100K  IV Iron as indicated    Interim History:  [name] is here today with her daughter.  She actually looks quite good.  She is not wearing oxygen.  She said that when she had her last Nplate, this made her feel a whole lot better.  Since then, she has been doing pretty well.  She has had no cardiac issues.  We do not have her blood sugars back yet.  I am sure that they will be on the high side.  She does have some element of renal insufficiency.  Over last saw her, her ferritin was 287 with an iron saturation of 23%.  She is eating okay.  She is having no problems with fever.  There is no obvious bleeding.  There is no change in bowel or bladder habits.  She has had no rashes.  She does have some ecchymoses.  Overall, I would have to say that her performance status is probably ECOG 2.    Medications:  Allergies as of 06/12/2021       Reactions   Adhesive [tape] Other (See Comments)   PULLS OFF THE SKIN   Apixaban Other (See Comments)   Internal Bleeding   Aspirin Itching, Rash, Hives, Swelling   Swelling of her tongue   Mirabegron Other (See Comments)   Patient experienced A-Fib   Pineapple Anaphylaxis, Swelling   Throat swells and blisters on tongue and roof of mouth per patient   Metformin Diarrhea, Nausea Only   Tetracycline Hives   Fluticasone-salmeterol Itching, Rash   Iodinated Diagnostic Agents Itching, Rash      Lactose Intolerance (gi) Other (See Comments)   Gas   Latex Itching, Rash, Other (See Comments)   Pulls off the skin and causes welts   Lisinopril Cough   Metronidazole Other (See Comments)   Reaction not known   Sulfa Antibiotics Rash   Sulfonamide Derivatives Itching,  Rash        Medication List        Accurate as of June 12, 2021 12:38 PM. If you have any questions, ask your nurse or doctor.          acetaminophen 325 MG tablet Commonly known as: TYLENOL Take 650 mg by mouth in the morning and at bedtime.   amLODipine 2.5 MG tablet Commonly known as: NORVASC Take 3 tablets (7.5 mg total) by mouth daily.   amoxicillin 500 MG tablet Commonly known as: AMOXIL Take 2,000 mg by mouth as needed. Prior to Dental Appt   atorvastatin 40 MG tablet Commonly known as: LIPITOR TAKE 1 TABLET(40 MG) BY MOUTH AT BEDTIME   BD Pen Needle Nano 2nd Gen 32G X 4 MM Misc Generic drug: Insulin Pen Needle USE TO INJECT UP TO FOUR TIMES DAILY AS DIRECTED   bisacodyl 5 MG EC tablet Commonly known as: DULCOLAX Take 10 mg by mouth every 3 (three) days as needed (for constipation).   fexofenadine 180 MG tablet Commonly known as: ALLEGRA Take 180 mg by mouth daily.   FreeStyle Libre 14 Day Sensor Misc APPLY NEW SENSOR EVERY 14 DAYS AS DIRECTED   hyoscyamine 0.125 MG tablet Commonly known as: LEVSIN Take 1 tablet by mouth daily.  insulin lispro 100 UNIT/ML KwikPen Commonly known as: HumaLOG KwikPen INJECT 13 TO 20 UNITS UNDER THE SKIN THREE TIMES DAILY WITH BREAKFAST, LUNCH AND EVENING MEAL   INSULIN SYRINGE .3CC/29GX1/2" 29G X 1/2" 0.3 ML Misc Use to inject insulin 4 times a day.   Lantus SoloStar 100 UNIT/ML Solostar Pen Generic drug: insulin glargine Inject 46 Units into the skin at bedtime.   levothyroxine 112 MCG tablet Commonly known as: SYNTHROID TAKE 1 TABLET(112 MCG) BY MOUTH DAILY   multivitamin tablet Take 1 tablet by mouth daily.   sertraline 50 MG tablet Commonly known as: ZOLOFT TAKE 1 TABLET BY MOUTH AT BEDTIME   torsemide 20 MG tablet Commonly known as: DEMADEX Take 40 mg by mouth every morning.   Trulicity 6.76 HM/0.9OB Sopn Generic drug: Dulaglutide Inject 0.75 mg into the skin once a week.   vitamin B-12 100  MCG tablet Commonly known as: CYANOCOBALAMIN Take 100 mcg by mouth daily.   vitamin C 250 MG tablet Commonly known as: ASCORBIC ACID Take 250 mg by mouth daily.   Vitamin D3 50 MCG (2000 UT) Tabs Take 5,000 Units by mouth daily.   Xeroform Petrolat Patch 4"x4" Pads Apply 1 Units topically daily as needed.        Allergies:  Allergies  Allergen Reactions   Adhesive [Tape] Other (See Comments)    PULLS OFF THE SKIN   Apixaban Other (See Comments)    Internal Bleeding   Aspirin Itching, Rash, Hives and Swelling    Swelling of her tongue   Mirabegron Other (See Comments)    Patient experienced A-Fib   Pineapple Anaphylaxis and Swelling    Throat swells and blisters on tongue and roof of mouth per patient   Metformin Diarrhea and Nausea Only   Tetracycline Hives   Fluticasone-Salmeterol Itching and Rash   Iodinated Diagnostic Agents Itching and Rash        Lactose Intolerance (Gi) Other (See Comments)    Gas    Latex Itching, Rash and Other (See Comments)    Pulls off the skin and causes welts   Lisinopril Cough   Metronidazole Other (See Comments)    Reaction not known   Sulfa Antibiotics Rash   Sulfonamide Derivatives Itching and Rash    Past Medical History, Surgical history, Social history, and Family History were reviewed and updated.  Review of Systems: All other 10 point review of systems is negative.   Physical Exam:  height is 5\' 1"  (1.549 m) and weight is 222 lb 1.9 oz (100.8 kg). Her oral temperature is 97.6 F (36.4 C). Her blood pressure is 148/44 (abnormal) and her pulse is 50 (abnormal). Her respiration is 18 and oxygen saturation is 98%.   Wt Readings from Last 3 Encounters:  06/12/21 222 lb 1.9 oz (100.8 kg)  06/05/21 221 lb 12.8 oz (100.6 kg)  05/26/21 222 lb 12.8 oz (101.1 kg)    Ocular: Sclerae unicteric, pupils equal, round and reactive to light Ear-nose-throat: Oropharynx clear, dentition fair Lymphatic: No cervical or supraclavicular  adenopathy Lungs no rales or rhonchi, good excursion bilaterally Heart regular rate and rhythm, no murmur appreciated Abd soft, nontender, positive bowel sounds MSK no focal spinal tenderness, no joint edema Neuro: non-focal, well-oriented, appropriate affect Breasts: Deferred   Lab Results  Component Value Date   WBC 5.4 06/12/2021   HGB 10.6 (L) 06/12/2021   HCT 33.3 (L) 06/12/2021   MCV 95.1 06/12/2021   PLT 115 (L) 06/12/2021   Lab Results  Component Value Date   FERRITIN 287 05/08/2021   IRON 71 05/08/2021   TIBC 301 05/08/2021   UIBC 231 05/08/2021   IRONPCTSAT 23 05/08/2021   Lab Results  Component Value Date   RETICCTPCT 2.1 06/12/2021   RBC 3.50 (L) 06/12/2021   RBC 3.42 (L) 06/12/2021   RETICCTABS 73.3 01/28/2011   No results found for: KPAFRELGTCHN, LAMBDASER, KAPLAMBRATIO No results found for: Kandis Cocking, IGMSERUM No results found for: Odetta Pink, SPEI   Chemistry      Component Value Date/Time   NA 139 05/08/2021 1146   NA 145 09/08/2017 1317   NA 142 02/25/2017 1013   K 5.3 (H) 05/08/2021 1146   K 4.5 09/08/2017 1317   K 3.8 02/25/2017 1013   CL 101 05/08/2021 1146   CL 98 09/08/2017 1317   CO2 30 05/08/2021 1146   CO2 32 09/08/2017 1317   CO2 26 02/25/2017 1013   BUN 68 (H) 05/08/2021 1146   BUN 34 (H) 09/08/2017 1317   BUN 20.1 02/25/2017 1013   CREATININE 2.49 (H) 05/08/2021 1146   CREATININE 1.98 (H) 01/17/2021 1601   CREATININE 1.8 (H) 02/25/2017 1013      Component Value Date/Time   CALCIUM 9.5 05/08/2021 1146   CALCIUM 9.3 09/08/2017 1317   CALCIUM 8.7 02/25/2017 1013   ALKPHOS 61 05/08/2021 1146   ALKPHOS 45 09/08/2017 1317   ALKPHOS 65 02/25/2017 1013   AST 16 05/08/2021 1146   AST 16 02/25/2017 1013   ALT 16 05/08/2021 1146   ALT 26 09/08/2017 1317   ALT 13 02/25/2017 1013   BILITOT 0.5 05/08/2021 1146   BILITOT 0.76 02/25/2017 1013       Impression and Plan: [name] is a very pleasant 86 yo caucasian female with immune based thrombocytopenia.   Even though her platelet count is above 100,000, we will go ahead and give her a dose today.  Again this does seem to make her feel good.  I think if we did this, we can then move her appointments out a little bit further.  We will see what her iron studies show.  We will plan to get her back in November now.  I think this would be reasonable.  We will try to coordinate her appointments so that we can miss the holidays.   Volanda Napoleon, MD 10/6/202212:38 PM

## 2021-06-12 NOTE — Patient Instructions (Signed)
Romiplostim injection What is this medication? ROMIPLOSTIM (roe mi PLOE stim) helps your body make more platelets. This medicine is used to treat low platelets caused by chronic idiopathic thrombocytopenic purpura (ITP) or a bone marrow syndrome caused by radiation sickness. This medicine may be used for other purposes; ask your health care provider or pharmacist if you have questions. COMMON BRAND NAME(S): Nplate What should I tell my care team before I take this medication? They need to know if you have any of these conditions: blood clots myelodysplastic syndrome an unusual or allergic reaction to romiplostim, mannitol, other medicines, foods, dyes, or preservatives pregnant or trying to get pregnant breast-feeding How should I use this medication? This medicine is injected under the skin. It is given by a health care provider in a hospital or clinic setting. A special MedGuide will be given to you before each treatment. Be sure to read this information carefully each time. Talk to your health care provider about the use of this medicine in children. While it may be prescribed for children as young as newborns for selected conditions, precautions do apply. Overdosage: If you think you have taken too much of this medicine contact a poison control center or emergency room at once. NOTE: This medicine is only for you. Do not share this medicine with others. What if I miss a dose? Keep appointments for follow-up doses. It is important not to miss your dose. Call your health care provider if you are unable to keep an appointment. What may interact with this medication? Interactions are not expected. This list may not describe all possible interactions. Give your health care provider a list of all the medicines, herbs, non-prescription drugs, or dietary supplements you use. Also tell them if you smoke, drink alcohol, or use illegal drugs. Some items may interact with your medicine. What should I  watch for while using this medication? Visit your health care provider for regular checks on your progress. You may need blood work done while you are taking this medicine. Your condition will be monitored carefully while you are receiving this medicine. It is important not to miss any appointments. What side effects may I notice from receiving this medication? Side effects that you should report to your doctor or health care professional as soon as possible: allergic reactions (skin rash, itching or hives; swelling of the face, lips, or tongue) bleeding (bloody or black, tarry stools; red or dark brown urine; spitting up blood or brown material that looks like coffee grounds; red spots on the skin; unusual bruising or bleeding from the eyes, gums, or nose) blood clot (chest pain; shortness of breath; pain, swelling, or warmth in the leg) stroke (changes in vision; confusion; trouble speaking or understanding; severe headaches; sudden numbness or weakness of the face, arm or leg; trouble walking; dizziness; loss of balance or coordination) Side effects that usually do not require medical attention (report to your doctor or health care professional if they continue or are bothersome): diarrhea dizziness headache joint pain muscle pain stomach pain trouble sleeping This list may not describe all possible side effects. Call your doctor for medical advice about side effects. You may report side effects to FDA at 1-800-FDA-1088. Where should I keep my medication? This medicine is given in a hospital or clinic. It will not be stored at home. NOTE: This sheet is a summary. It may not cover all possible information. If you have questions about this medicine, talk to your doctor, pharmacist, or health care provider.    2022 Elsevier/Gold Standard (2019-10-09 10:28:13)  

## 2021-06-12 NOTE — Telephone Encounter (Signed)
Patient confirmed appointment being rescheduled with Dr. Marin Olp.

## 2021-06-13 ENCOUNTER — Telehealth: Payer: Self-pay | Admitting: *Deleted

## 2021-06-13 ENCOUNTER — Other Ambulatory Visit: Payer: Self-pay | Admitting: *Deleted

## 2021-06-13 LAB — IRON AND TIBC
Iron: 58 ug/dL (ref 41–142)
Saturation Ratios: 19 % — ABNORMAL LOW (ref 21–57)
TIBC: 315 ug/dL (ref 236–444)
UIBC: 257 ug/dL (ref 120–384)

## 2021-06-13 LAB — FERRITIN: Ferritin: 272 ng/mL (ref 11–307)

## 2021-06-13 NOTE — Telephone Encounter (Signed)
Message left to notify patient per order of Dr. Marin Olp that "the iron is a little low.  She needs 1 dose of IV iron.  Pete"  Instructed pt to call office back at her convenience.  Message sent to scheduling.

## 2021-06-13 NOTE — Telephone Encounter (Signed)
-----   Message from Volanda Napoleon, MD sent at 06/13/2021  9:17 AM EDT ----- Call - the iron is a little low.  She needs 1 dose of IV iron. Laurey Arrow

## 2021-06-13 NOTE — Telephone Encounter (Signed)
Opened in error

## 2021-06-13 NOTE — Telephone Encounter (Signed)
Per 06/12/21 los - called and gave upcoming appointments - confirmed

## 2021-06-16 ENCOUNTER — Ambulatory Visit: Payer: Medicare Other

## 2021-06-18 ENCOUNTER — Other Ambulatory Visit: Payer: Self-pay

## 2021-06-18 ENCOUNTER — Inpatient Hospital Stay: Payer: Medicare Other

## 2021-06-18 VITALS — BP 156/42 | HR 54 | Temp 97.9°F | Resp 17

## 2021-06-18 DIAGNOSIS — G4733 Obstructive sleep apnea (adult) (pediatric): Secondary | ICD-10-CM | POA: Diagnosis not present

## 2021-06-18 DIAGNOSIS — J9690 Respiratory failure, unspecified, unspecified whether with hypoxia or hypercapnia: Secondary | ICD-10-CM | POA: Diagnosis not present

## 2021-06-18 DIAGNOSIS — D509 Iron deficiency anemia, unspecified: Secondary | ICD-10-CM | POA: Diagnosis not present

## 2021-06-18 DIAGNOSIS — J40 Bronchitis, not specified as acute or chronic: Secondary | ICD-10-CM | POA: Diagnosis not present

## 2021-06-18 DIAGNOSIS — D693 Immune thrombocytopenic purpura: Secondary | ICD-10-CM

## 2021-06-18 DIAGNOSIS — M6281 Muscle weakness (generalized): Secondary | ICD-10-CM | POA: Diagnosis not present

## 2021-06-18 MED ORDER — SODIUM CHLORIDE 0.9 % IV SOLN
510.0000 mg | Freq: Once | INTRAVENOUS | Status: AC
  Administered 2021-06-18: 510 mg via INTRAVENOUS
  Filled 2021-06-18: qty 510

## 2021-06-18 NOTE — Progress Notes (Signed)
Patient declined to stay for the post infusion observation period. Patient denies any difficulty with this infusion in the past and is aware to call with any questions or concerns.   Pt verbalized understanding and had no further questions today.   

## 2021-06-18 NOTE — Patient Instructions (Signed)
Ferumoxytol Injection What is this medication? FERUMOXYTOL (FER ue MOX i tol) treats low levels of iron in your body (iron deficiency anemia). Iron is a mineral that plays an important role in making red blood cells, which carry oxygen from your lungs to the rest of your body. This medicine may be used for other purposes; ask your health care provider or pharmacist if you have questions. COMMON BRAND NAME(S): Feraheme What should I tell my care team before I take this medication? They need to know if you have any of these conditions: Anemia not caused by low iron levels High levels of iron in the blood Magnetic resonance imaging (MRI) test scheduled An unusual or allergic reaction to iron, other medications, foods, dyes, or preservatives Pregnant or trying to get pregnant Breast-feeding How should I use this medication? This medication is for injection into a vein. It is given in a hospital or clinic setting. Talk to your care team the use of this medication in children. Special care may be needed. Overdosage: If you think you have taken too much of this medicine contact a poison control center or emergency room at once. NOTE: This medicine is only for you. Do not share this medicine with others. What if I miss a dose? It is important not to miss your dose. Call your care team if you are unable to keep an appointment. What may interact with this medication? Other iron products This list may not describe all possible interactions. Give your health care provider a list of all the medicines, herbs, non-prescription drugs, or dietary supplements you use. Also tell them if you smoke, drink alcohol, or use illegal drugs. Some items may interact with your medicine. What should I watch for while using this medication? Visit your care team regularly. Tell your care team if your symptoms do not start to get better or if they get worse. You may need blood work done while you are taking this  medication. You may need to follow a special diet. Talk to your care team. Foods that contain iron include: whole grains/cereals, dried fruits, beans, or peas, leafy green vegetables, and organ meats (liver, kidney). What side effects may I notice from receiving this medication? Side effects that you should report to your care team as soon as possible: Allergic reactions-skin rash, itching, hives, swelling of the face, lips, tongue, or throat Low blood pressure-dizziness, feeling faint or lightheaded, blurry vision Shortness of breath Side effects that usually do not require medical attention (report to your care team if they continue or are bothersome): Flushing Headache Joint pain Muscle pain Nausea Pain, redness, or irritation at injection site This list may not describe all possible side effects. Call your doctor for medical advice about side effects. You may report side effects to FDA at 1-800-FDA-1088. Where should I keep my medication? This medication is given in a hospital or clinic and will not be stored at home. NOTE: This sheet is a summary. It may not cover all possible information. If you have questions about this medicine, talk to your doctor, pharmacist, or health care provider.  2022 Elsevier/Gold Standard (2021-01-10 15:35:12)  

## 2021-06-23 ENCOUNTER — Telehealth: Payer: Self-pay | Admitting: Family Medicine

## 2021-06-23 DIAGNOSIS — R5381 Other malaise: Secondary | ICD-10-CM

## 2021-06-23 NOTE — Telephone Encounter (Signed)
Pt is calling and her cardiologist recommend her getting physical therapy for her endurance also exercise  . Pt uses a walker . Please advise

## 2021-06-25 NOTE — Telephone Encounter (Signed)
Spoke with the patient and informed her the order was approved as below.  Per patient's request, the order was printed was left at the front desk for pick up to take to the location of her choice.

## 2021-06-25 NOTE — Telephone Encounter (Signed)
Filer for PT order for deconitioning

## 2021-06-26 ENCOUNTER — Ambulatory Visit: Payer: Medicare Other | Attending: Family Medicine | Admitting: Physical Therapy

## 2021-06-26 ENCOUNTER — Other Ambulatory Visit: Payer: Self-pay

## 2021-06-26 DIAGNOSIS — R262 Difficulty in walking, not elsewhere classified: Secondary | ICD-10-CM | POA: Diagnosis not present

## 2021-06-26 DIAGNOSIS — R5381 Other malaise: Secondary | ICD-10-CM | POA: Diagnosis not present

## 2021-06-26 DIAGNOSIS — M6281 Muscle weakness (generalized): Secondary | ICD-10-CM

## 2021-06-26 DIAGNOSIS — R2689 Other abnormalities of gait and mobility: Secondary | ICD-10-CM | POA: Insufficient documentation

## 2021-06-26 NOTE — Therapy (Signed)
[facility] @ [place], [place], [address] Phone: [phone]   Fax:  [phone]  Physical Therapy Evaluation  Patient Details  Name: [name] MRN: [id number] Date of Birth: 25-Feb-1935 Referring Provider (PT): Micheline Rough, MD   Encounter Date: 06/26/2021   PT End of Session - 06/26/21 1616     Visit Number 1    Number of Visits 10    Date for PT Re-Evaluation 08/21/21    Authorization Type [employer] and [employer]    PT Start Time 1533    PT Stop Time 1620    PT Time Calculation (min) 47 min    Activity Tolerance Other (comment)   SpO2 drops with standing and walking activities: she has portable oxygen and will bring with her until tolerance improves   Behavior During Therapy [facility] for tasks assessed/performed             Past Medical History:  Diagnosis Date   Allergic rhinitis    Anxiety state, unspecified    panic attacks   CHF (congestive heart failure) ([place])    Depressive disorder, not elsewhere classified    Disorientation, unspecified 08/07/2019   Epistaxis 03/24/2018   Extrinsic asthma, unspecified    no problem since adulthood   History of complications due to general anesthesia 03/03/2021   History of pulmonary embolism 08/07/2019   Hypertensive urgency 01/30/2021   Obesity    OSA on CPAP    severe   Pneumonitis 07/14/2019   Pressure injury of skin 07/10/2019   Pulmonary embolism ([place])    Pure hypercholesterolemia    Respiratory failure with hypoxia ([place]) 09/2008   acute, secondary to multiple bilateral pulmonary embolism , negative hypercoagulable workup 09/2008 hospital stay   Scoliosis    Type II or unspecified type diabetes mellitus without mention of complication, not stated as uncontrolled    Unspecified hypothyroidism    hypo    Past Surgical History:  Procedure Laterality Date   CARDIOVERSION N/A 06/26/2019   Procedure: CARDIOVERSION;  Surgeon: Jerline Pain,  MD;  Location: [facility];  Service: Cardiovascular;  Laterality: N/A;   EYE SURGERY Bilateral 2005   cataracts, implants   KIDNEY STONE SURGERY Bilateral    2016, 2017    knee replaced Left 2013   THYROID SURGERY  1966   nodule removal; partial thyroidectomy; radioactive iodine x2; 1990's    There were no vitals filed for this visit.    Subjective Assessment - 06/26/21 1538     Subjective Hospitalized in ICU 2 months Thanksgiving to Christmas 2020.  Treated as a Hospice patient in [place].  2 days later felt better so got out of bed but very weak.  Had PT at [facility] 33 steps.  Went home with [place] 1 year.  Recently more out of breath and no endurance.  Heart doctor said "get moving."  Using RW.  Has daytime cg and someone who stays with her at night.  Cg helps her with higher level ADL's and all IADL's.    Pertinent History CHF  grandson PT at [facility]    Limitations Walking;House hold activities;Lifting;Standing    How long can you walk comfortably? 30-50 feet    Patient Stated Goals I want to walk so much; eventually off walker to cane    Currently in Pain? No/denies                [facility] PT Assessment - 06/26/21 0001  Assessment   Referring Provider (PT) Micheline Rough, MD      Precautions   Precautions None   shortness of breath     Balance Screen   Has the patient fallen in the past 6 months No    Is the patient reluctant to leave their home because of a fear of falling?  No      Home Environment   Living Arrangements Other (Comment);Children    Available Help at Discharge Personal care attendant    Type of [place] to enter    Entrance Stairs-Number of Steps [place] One level    [place] - 2 wheels;Walker - 4 wheels      Prior Function   Level of Independence Independent with basic ADLs;Independent with household mobility with device    Vocation Retired     Leisure none      Cognition   Overall Cognitive Status Within Functional Limits for tasks assessed      Functional Tests   Functional tests Sit to Stand   18.51 sec     Sit to Stand   Comments 18.51 sec      ROM / Strength   AROM / PROM / Strength AROM      AROM   Overall AROM  Within functional limits for tasks performed    Overall AROM Comments unable to fasten bra in back but fastens in front and turns      Standardized Balance Assessment   Standardized Balance Assessment Timed Up and Go Test    Five times sit to stand comments  18.51 sec      Berg Balance Test   Sit to Stand Able to stand without using hands and stabilize independently    Standing Unsupported Able to stand 2 minutes with supervision    Sitting with Back Unsupported but Feet Supported on Floor or Stool Able to sit 2 minutes under supervision    Stand to Sit Sits safely with minimal use of hands    Transfers Able to transfer safely, minor use of hands    Standing Unsupported with Eyes Closed Able to stand 10 seconds with supervision    Standing Unsupported with Feet Together Able to place feet together independently and stand for 1 minute with supervision    From Standing, Reach Forward with Outstretched Arm Can reach forward >12 cm safely (5")    From Standing Position, Pick up Object from Floor Able to pick up shoe, needs supervision    From Standing Position, Turn to Look Behind Over each Shoulder Looks behind one side only/other side shows less weight shift    Turn 360 Degrees Able to turn 360 degrees safely but slowly    Standing Unsupported, Alternately Place Feet on Step/Stool Able to complete >2 steps/needs minimal assist    Standing Unsupported, One Foot in Front Needs help to step but can hold 15 seconds    Standing on One Leg Tries to lift leg/unable to hold 3 seconds but remains standing independently    Total Score 38    Berg comment: SpO2 85-87% following test      Timed Up and Go Test   TUG  Normal TUG   with rolling walker: no need for UE support to stand   Normal TUG (seconds) 25.94    TUG Comments SpO2: 85% after TUG, returned to 94% with 2 min rest break  Objective measurements completed on examination: See above findings.                  PT Short Term Goals - 06/26/21 1633       PT SHORT TERM GOAL #1   Title be independent in initial HEP    Time 4    Period Weeks    Status New    Target Date 07/24/21      PT SHORT TERM GOAL #2   Title TUG to be improved to 21 sec    Baseline 25.94 sec    Time 4    Period Weeks    Status New    Target Date 07/24/21      PT SHORT TERM GOAL #3   Title BERG score to be 41    Baseline 38    Time 4    Period Weeks    Status New    Target Date 07/24/21               PT Long Term Goals - 06/26/21 1635       PT LONG TERM GOAL #1   Title be independent in advanced HEP    Baseline not issued    Time 8    Period Weeks    Status New    Target Date 08/21/21      PT LONG TERM GOAL #2   Title Pt will improve 5 times sit<>stand to <13 sec for decreased fall risk    Baseline 15.81 sec    Time 8    Period Weeks    Status New    Target Date 08/21/21      PT LONG TERM GOAL #3   Title BERG score to be 44    Baseline 38    Time 8    Period Weeks    Status New    Target Date 08/21/21      PT LONG TERM GOAL #4   Title Patient to be able to walk x 3 min with O2 sats greater than 90%    Time 8    Period Weeks    Status New    Target Date 08/21/21      PT LONG TERM GOAL #5   Title .Marland KitchenMarland Kitchen                    Plan - 06/26/21 1618     Clinical Impression Statement Patient referred for deconditioning.  She presents with overall functional ROM and strength but fatigues and O2 sats decrease with standing or walking activities to as low as 85%.  She has not been very active and currently has caregiver at home.  She admits she could do more but tends to rely on  caregivers.  She desires to become more independent and walk more.  She is obese and has several co-morbidites that may affect her tolerance including CHF.  She would benefit from skilled PT to address LE activity tolerance, overall activity tolerance, gait instability and balance.    Personal Factors and Comorbidities Comorbidity 1;Comorbidity 2;Comorbidity 3+    Comorbidities obesity, CHF, HTN, diabetes, kidney disease    Examination-Activity Limitations Stand;Locomotion Level;Bathing;Hygiene/Grooming;Stairs;Carry;Dressing    Examination-Participation Restrictions Cleaning;Shop;Meal Prep;Community Activity;Driving    Stability/Clinical Decision Making Evolving/Moderate complexity    Clinical Decision Making Moderate    Rehab Potential Good    PT Frequency 2x / week    PT Duration 8 weeks    PT Treatment/Interventions ADLs/Self Care Home  Management;Aquatic Therapy;Gait training;Stair training;Functional mobility training;Neuromuscular re-education;Balance training;Therapeutic exercise;Therapeutic activities;Patient/family education;Dry needling;Manual techniques    PT Next Visit Plan Add HEP, monitor O2 for activity tolerance, NuStep    Consulted and Agree with Plan of Care Patient             Patient will benefit from skilled therapeutic intervention in order to improve the following deficits and impairments:  Decreased balance, Abnormal gait, Decreased endurance, Difficulty walking, Cardiopulmonary status limiting activity, Decreased activity tolerance, Decreased strength, Impaired perceived functional ability, Obesity  Visit Diagnosis: Difficulty in walking, not elsewhere classified - Plan: PT plan of care cert/re-cert  Muscle weakness (generalized) - Plan: PT plan of care cert/re-cert  Other abnormalities of gait and mobility - Plan: PT plan of care cert/re-cert     Problem List Patient Active Problem List   Diagnosis Date Noted   Acute on chronic diastolic (congestive) heart  failure ([place]) 03/14/2021   History of anxiety 03/03/2021   History of blood clotting disorder 03/03/2021   History of complications due to general anesthesia 03/03/2021   Acute on chronic diastolic CHF (congestive heart failure) (H[facility]) 01/30/2021   Lateral epicondylitis of right elbow 08/28/2020   Pain in joint of right elbow 07/15/2020   Calculus of gallbladder without cholecystitis without obstruction 09/28/2019   Infection and inflammatory reaction due to indwelling urethral catheter, subsequent encounter 09/28/2019   Nondisplaced fracture of lateral malleolus of right fibula, subsequent encounter for closed fracture with routine healing 09/28/2019   Hemorrhage of anus and rectum 08/07/2019   Unspecified protein-calorie malnutrition ([place]) 08/07/2019   Difficulty in walking, not elsewhere classified 07/18/2019   Dysphagia, oropharyngeal phase 07/18/2019   Chronic respiratory failure with hypoxia ([place]) 07/03/2019   Atypical atrial flutter (H[facility])    Paroxysmal atrial fibrillation ([place]) 05/24/2018   Blister (nonthermal), unspecified lower leg, initial encounter 03/24/2018   Cellulitis, unspecified 03/24/2018   Diarrhea, unspecified 03/24/2018   Immune thrombocytopenic purpura ([place]) 03/24/2018   Major depressive disorder, recurrent, mild ([place]) 03/24/2018   Nausea with vomiting, unspecified 03/24/2018   Atherosclerotic heart disease of native coronary artery without angina pectoris 01/13/2018   Candidal vulvovaginitis 01/13/2018   Dental caries, unspecified 01/13/2018   Hypertensive heart and chronic kidney disease with heart failure and stage 1 through stage 4 chronic kidney disease, or unspecified chronic kidney disease ([place]) 01/13/2018   Long term (current) use of insulin ([place]) 01/13/2018   Muscle weakness (generalized) 01/13/2018   Other adverse food reactions, not elsewhere classified, initial encounter 01/13/2018   Other chronic sinusitis 01/13/2018   Postprocedural hypothyroidism  01/13/2018   Unsteadiness on feet 01/13/2018   Urinary tract infection, site not specified 01/13/2018   Normocytic anemia 05/18/2017   Chronic ITP (idiopathic thrombocytopenia) (H[facility]) 01/25/2017   Hypercalciuria 01/15/2016   Hyperoxaluria 01/15/2016   Chronic kidney disease (CKD) stage G4/A1, severely decreased glomerular filtration rate (GFR) between 15-29 mL/min/1.73 square meter and albuminuria creatinine ratio less than 30 mg/g (HCC) 09/04/2015   Acute on chronic respiratory failure with hypoxia ([facility]) 08/24/2015   Decreased urine volume 07/10/2015   Hypocitraturia 07/10/2015   Physical deconditioning 10/11/2014   Latex allergy, contact dermatitis 10/09/2014   Primary osteoarthritis of left knee 09/17/2014   Clostridium difficile carrier 06/07/2014   Constipation, unspecified 06/07/2012   Nutritional deficiency, unspecified 06/07/2012   Tachycardia 06/07/2012   Dyslipidemia 02/12/2012   GERD (gastroesophageal reflux disease) 02/12/2012   DM type 2 (diabetes mellitus, type 2) ([place]) 08/05/2010   GOUT 06/23/2010   DERMATITIS 01/16/2010  BACK PAIN 06/13/2009   MYALGIA 06/13/2009   Anxiety state 12/13/2008   LEG EDEMA, CHRONIC 11/08/2008   Sleep apnea 10/26/2008   Hypothyroidism 09/27/2008   HYPERCHOLESTEROLEMIA 09/27/2008   OBESITY 09/27/2008   Depression, recurrent ([place]) 09/27/2008   Essential hypertension 09/27/2008   Allergic rhinitis 09/27/2008   ASTHMA, CHILDHOOD 09/27/2008    Anderson Malta B. Fronia Depass, PT 10/20/224:44 PM   [facility] @ [place], [place], [address] Phone: [phone]   Fax:  [phone]  Name: [name] MRN: [id number] Date of Birth: 1934/12/22

## 2021-07-01 ENCOUNTER — Ambulatory Visit: Payer: Medicare Other | Admitting: Physical Therapy

## 2021-07-01 ENCOUNTER — Other Ambulatory Visit: Payer: Self-pay

## 2021-07-01 DIAGNOSIS — R2689 Other abnormalities of gait and mobility: Secondary | ICD-10-CM

## 2021-07-01 DIAGNOSIS — R262 Difficulty in walking, not elsewhere classified: Secondary | ICD-10-CM | POA: Diagnosis not present

## 2021-07-01 DIAGNOSIS — G8929 Other chronic pain: Secondary | ICD-10-CM

## 2021-07-01 DIAGNOSIS — M6281 Muscle weakness (generalized): Secondary | ICD-10-CM

## 2021-07-01 DIAGNOSIS — R5381 Other malaise: Secondary | ICD-10-CM | POA: Diagnosis not present

## 2021-07-01 NOTE — Therapy (Signed)
[facility] @ [address] [place] [place], [place], [address] Phone: [phone]   Fax:  [phone]  Physical Therapy Treatment  Patient Details  Name: [name] MRN: [id number] Date of Birth: 04-20-1935 Referring Provider (PT): Micheline Rough, MD   Encounter Date: 07/01/2021   PT End of Session - 07/01/21 1632     Visit Number 2    Number of Visits [facility] and [employer]    PT Start Time 6301   late   PT Stop Time 6010    PT Time Calculation (min) 33 min    Activity Tolerance Patient limited by fatigue             Past Medical History:  Diagnosis Date   Allergic rhinitis    Anxiety state, unspecified    panic attacks   CHF (congestive heart failure) ([place])    Depressive disorder, not elsewhere classified    Disorientation, unspecified 08/07/2019   Epistaxis 03/24/2018   Extrinsic asthma, unspecified    no problem since adulthood   History of complications due to general anesthesia 03/03/2021   History of pulmonary embolism 08/07/2019   Hypertensive urgency 01/30/2021   Obesity    OSA on CPAP    severe   Pneumonitis 07/14/2019   Pressure injury of skin 07/10/2019   Pulmonary embolism ([place])    Pure hypercholesterolemia    Respiratory failure with hypoxia ([place]) 09/2008   acute, secondary to multiple bilateral pulmonary embolism , negative hypercoagulable workup 09/2008 hospital stay   Scoliosis    Type II or unspecified type diabetes mellitus without mention of complication, not stated as uncontrolled    Unspecified hypothyroidism    hypo    Past Surgical History:  Procedure Laterality Date   CARDIOVERSION N/A 06/26/2019   Procedure: CARDIOVERSION;  Surgeon: Jerline Pain, MD;  Location: [facility];  Service: Cardiovascular;  Laterality: N/A;   EYE SURGERY Bilateral 2005   cataracts, implants   KIDNEY STONE SURGERY Bilateral    2016, 2017    knee replaced Left 2013   THYROID  SURGERY  1966   nodule removal; partial thyroidectomy; radioactive iodine x2; 1990's    There were no vitals filed for this visit.   Subjective Assessment - 07/01/21 1629     Subjective I'm sorry I'm late.  I have a new caregiver helping me today.    Pertinent History CHF  grandson PT at [facility]    Currently in Pain? No/denies    Pain Score 0-No pain                               [facility] Adult PT Treatment/Exercise - 07/01/21 0001       Lumbar Exercises: Seated   Sit to Stand Limitations sit ups 10x no weight    Other Seated Lumbar Exercises red band rows 10x    Other Seated Lumbar Exercises yellow plyo ball: 5-8x each hip to hip, shoulder to hip; golf swings, ear to ear      Knee/Hip Exercises: Aerobic   Nustep 3:30 L1      Knee/Hip Exercises: Standing   Other Standing Knee Exercises alternating step taps on edge of treadmill 5x    Other Standing Knee Exercises alternating lateral taps 5x right/left      Knee/Hip Exercises: Seated   Sit to Sand 1 set;5 reps  PT Short Term Goals - 06/26/21 1633       PT SHORT TERM GOAL #1   Title be independent in initial HEP    Time 4    Period Weeks    Status New    Target Date 07/24/21      PT SHORT TERM GOAL #2   Title TUG to be improved to 21 sec    Baseline 25.94 sec    Time 4    Period Weeks    Status New    Target Date 07/24/21      PT SHORT TERM GOAL #3   Title BERG score to be 41    Baseline 38    Time 4    Period Weeks    Status New    Target Date 07/24/21               PT Long Term Goals - 06/26/21 1635       PT LONG TERM GOAL #1   Title be independent in advanced HEP    Baseline not issued    Time 8    Period Weeks    Status New    Target Date 08/21/21      PT LONG TERM GOAL #2   Title Pt will improve 5 times sit<>stand to <13 sec for decreased fall risk    Baseline 15.81 sec    Time 8    Period Weeks    Status New    Target Date 08/21/21       PT LONG TERM GOAL #3   Title BERG score to be 44    Baseline 38    Time 8    Period Weeks    Status New    Target Date 08/21/21      PT LONG TERM GOAL #4   Title Patient to be able to walk x 3 min with O2 sats greater than 90%    Time 8    Period Weeks    Status New    Target Date 08/21/21      PT LONG TERM GOAL #5   Title .Marland KitchenMarland Kitchen                   Plan - 07/01/21 1633     Clinical Impression Statement HR and O2 saturations monitored periodically throughout treatment session.  With seated ex's both remain good (> 91% O2 and HR >62).  Standing to perform basic LE movements (low reps) causes LBP and shortness of breath with O2 dropping to 88% and requiring at least 3 minutes to rise above 90%.   She is highly motivated and puts forth good effort with all tasks today.    Comorbidities obesity, CHF, HTN, diabetes, kidney disease    Examination-Activity Limitations Stand;Locomotion Level;Bathing;Hygiene/Grooming;Stairs;Carry;Dressing    Examination-Participation Restrictions Cleaning;Shop;Meal Prep;Community Activity;Driving    Rehab Potential Good    PT Frequency 2x / week    PT Duration 8 weeks    PT Treatment/Interventions ADLs/Self Care Home Management;Aquatic Therapy;Gait training;Stair training;Functional mobility training;Neuromuscular re-education;Balance training;Therapeutic exercise;Therapeutic activities;Patient/family education;Dry needling;Manual techniques    PT Next Visit Plan start HEP, monitor O2 and HR, NuStep; seated ex; limited standing tolerance             Patient will benefit from skilled therapeutic intervention in order to improve the following deficits and impairments:  Decreased balance, Abnormal gait, Decreased endurance, Difficulty walking, Cardiopulmonary status limiting activity, Decreased activity tolerance, Decreased strength, Impaired perceived functional ability, Obesity  Visit Diagnosis: Difficulty in walking, not elsewhere  classified  Muscle weakness (generalized)  Other abnormalities of gait and mobility  Chronic pain of right knee     Problem List Patient Active Problem List   Diagnosis Date Noted   Acute on chronic diastolic (congestive) heart failure ([place]) 03/14/2021   History of anxiety 03/03/2021   History of blood clotting disorder 03/03/2021   History of complications due to general anesthesia 03/03/2021   Acute on chronic diastolic CHF (congestive heart failure) ([place]) 01/30/2021   Lateral epicondylitis of right elbow 08/28/2020   Pain in joint of right elbow 07/15/2020   Calculus of gallbladder without cholecystitis without obstruction 09/28/2019   Infection and inflammatory reaction due to indwelling urethral catheter, subsequent encounter 09/28/2019   Nondisplaced fracture of lateral malleolus of right fibula, subsequent encounter for closed fracture with routine healing 09/28/2019   Hemorrhage of anus and rectum 08/07/2019   Unspecified protein-calorie malnutrition ([place]) 08/07/2019   Difficulty in walking, not elsewhere classified 07/18/2019   Dysphagia, oropharyngeal phase 07/18/2019   Chronic respiratory failure with hypoxia ([place]) 07/03/2019   Atypical atrial flutter ([facility])    Paroxysmal atrial fibrillation ([place]) 05/24/2018   Blister (nonthermal), unspecified lower leg, initial encounter 03/24/2018   Cellulitis, unspecified 03/24/2018   Diarrhea, unspecified 03/24/2018   Immune thrombocytopenic purpura ([place]) 03/24/2018   Major depressive disorder, recurrent, mild ([place]) 03/24/2018   Nausea with vomiting, unspecified 03/24/2018   Atherosclerotic heart disease of native coronary artery without angina pectoris 01/13/2018   Candidal vulvovaginitis 01/13/2018   Dental caries, unspecified 01/13/2018   Hypertensive heart and chronic kidney disease with heart failure and stage 1 through stage 4 chronic kidney disease, or unspecified chronic kidney disease ([place]) 01/13/2018   Long term  (current) use of insulin ([place]) 01/13/2018   Muscle weakness (generalized) 01/13/2018   Other adverse food reactions, not elsewhere classified, initial encounter 01/13/2018   Other chronic sinusitis 01/13/2018   Postprocedural hypothyroidism 01/13/2018   Unsteadiness on feet 01/13/2018   Urinary tract infection, site not specified 01/13/2018   Normocytic anemia 05/18/2017   Chronic ITP (idiopathic thrombocytopenia) ([place]) 01/25/2017   Hypercalciuria 01/15/2016   Hyperoxaluria 01/15/2016   Chronic kidney disease (CKD) stage G4/A1, severely decreased glomerular filtration rate (GFR) between 15-29 mL/min/1.73 square meter and albuminuria creatinine ratio less than 30 mg/g ([place]) 09/04/2015   Acute on chronic respiratory failure with hypoxia ([place]) 08/24/2015   Decreased urine volume 07/10/2015   Hypocitraturia 07/10/2015   Physical deconditioning 10/11/2014   Latex allergy, contact dermatitis 10/09/2014   Primary osteoarthritis of left knee 09/17/2014   Clostridium difficile carrier 06/07/2014   Constipation, unspecified 06/07/2012   Nutritional deficiency, unspecified 06/07/2012   Tachycardia 06/07/2012   Dyslipidemia 02/12/2012   GERD (gastroesophageal reflux disease) 02/12/2012   DM type 2 (diabetes mellitus, type 2) ([place]) 08/05/2010   GOUT 06/23/2010   DERMATITIS 01/16/2010   BACK PAIN 06/13/2009   MYALGIA 06/13/2009   Anxiety state 12/13/2008   LEG EDEMA, CHRONIC 11/08/2008   Sleep apnea 10/26/2008   Hypothyroidism 09/27/2008   HYPERCHOLESTEROLEMIA 09/27/2008   OBESITY 09/27/2008   Depression, recurrent ([place]) 09/27/2008   Essential hypertension 09/27/2008   Allergic rhinitis 09/27/2008   ASTHMA, CHILDHOOD 09/27/2008   Ruben Im, PT 07/01/21 4:39 PM Phone: [phone] Fax: [phone]  Alvera Singh, PT 07/01/2021, 4:39 PM  [facility] @ [address] [place], [place], [address] Phone: [phone]   Fax:   [phone]  Name: [name]  MRN: [id number] Date of Birth: Mar 28, 1935

## 2021-07-03 ENCOUNTER — Other Ambulatory Visit: Payer: Self-pay

## 2021-07-03 ENCOUNTER — Ambulatory Visit: Payer: Medicare Other

## 2021-07-03 DIAGNOSIS — R2689 Other abnormalities of gait and mobility: Secondary | ICD-10-CM | POA: Diagnosis not present

## 2021-07-03 DIAGNOSIS — M25561 Pain in right knee: Secondary | ICD-10-CM

## 2021-07-03 DIAGNOSIS — R5381 Other malaise: Secondary | ICD-10-CM | POA: Diagnosis not present

## 2021-07-03 DIAGNOSIS — G8929 Other chronic pain: Secondary | ICD-10-CM

## 2021-07-03 DIAGNOSIS — M6281 Muscle weakness (generalized): Secondary | ICD-10-CM

## 2021-07-03 DIAGNOSIS — R262 Difficulty in walking, not elsewhere classified: Secondary | ICD-10-CM | POA: Diagnosis not present

## 2021-07-03 NOTE — Therapy (Signed)
[facility] @ [address], [place], [address] Phone: [phone]   Fax:  [phone]  Physical Therapy Treatment  Patient Details  Name: [name] MRN: [id number] Date of Birth: 06/03/1935 Referring Provider (PT): Micheline Rough, MD   Encounter Date: 07/03/2021   PT End of Session - 07/03/21 1448     Visit Number 3    Number of Visits 10    Date for PT Re-Evaluation 08/21/21    Authorization Type [employer] and [employer]    PT Start Time 1324    PT Stop Time 1448    PT Time Calculation (min) 44 min    Activity Tolerance Patient limited by fatigue    Behavior During Therapy [facility] for tasks assessed/performed             Past Medical History:  Diagnosis Date   Allergic rhinitis    Anxiety state, unspecified    panic attacks   CHF (congestive heart failure) ([place])    Depressive disorder, not elsewhere classified    Disorientation, unspecified 08/07/2019   Epistaxis 03/24/2018   Extrinsic asthma, unspecified    no problem since adulthood   History of complications due to general anesthesia 03/03/2021   History of pulmonary embolism 08/07/2019   Hypertensive urgency 01/30/2021   Obesity    OSA on CPAP    severe   Pneumonitis 07/14/2019   Pressure injury of skin 07/10/2019   Pulmonary embolism ([place])    Pure hypercholesterolemia    Respiratory failure with hypoxia ([place]) 09/2008   acute, secondary to multiple bilateral pulmonary embolism , negative hypercoagulable workup 09/2008 hospital stay   Scoliosis    Type II or unspecified type diabetes mellitus without mention of complication, not stated as uncontrolled    Unspecified hypothyroidism    hypo    Past Surgical History:  Procedure Laterality Date   CARDIOVERSION N/A 06/26/2019   Procedure: CARDIOVERSION;  Surgeon: Jerline Pain, MD;  Location: [facility];  Service: Cardiovascular;  Laterality: N/A;   EYE SURGERY Bilateral 2005   cataracts,  implants   KIDNEY STONE SURGERY Bilateral    2016, 2017    knee replaced Left 2013   THYROID SURGERY  1966   nodule removal; partial thyroidectomy; radioactive iodine x2; 1990's    There were no vitals filed for this visit.   Subjective Assessment - 07/03/21 1413     Subjective Patient accompanied by caregiver in lobby but arrives in wheelchair with oxygen as we discussed last visit.  She denies any pain and is in no acute distress.    Pertinent History CHF  grandson PT at [facility]    Limitations Walking;House hold activities;Lifting;Standing    How long can you walk comfortably? 30-50 feet    Patient Stated Goals I want to walk so much; eventually off walker to cane                               [facility] Adult PT Treatment/Exercise - 07/03/21 0001       Lumbar Exercises: Seated   Sit to Stand Limitations from chair with arms, no UE use, with blue balance pad    Other Seated Lumbar Exercises red band rows 10x standing    Other Seated Lumbar Exercises Did all standing:yellow plyo ball: 10x each hip to hip, shoulder to hip; golf swings, ear to ear      Knee/Hip Exercises: Aerobic  Nustep 5 min L1 seat 7, arms 6                       PT Short Term Goals - 06/26/21 1633       PT SHORT TERM GOAL #1   Title be independent in initial HEP    Time 4    Period Weeks    Status New    Target Date 07/24/21      PT SHORT TERM GOAL #2   Title TUG to be improved to 21 sec    Baseline 25.94 sec    Time 4    Period Weeks    Status New    Target Date 07/24/21      PT SHORT TERM GOAL #3   Title BERG score to be 41    Baseline 38    Time 4    Period Weeks    Status New    Target Date 07/24/21               PT Long Term Goals - 06/26/21 1635       PT LONG TERM GOAL #1   Title be independent in advanced HEP    Baseline not issued    Time 8    Period Weeks    Status New    Target Date 08/21/21      PT LONG TERM GOAL #2   Title Pt will  improve 5 times sit<>stand to <13 sec for decreased fall risk    Baseline 15.81 sec    Time 8    Period Weeks    Status New    Target Date 08/21/21      PT LONG TERM GOAL #3   Title BERG score to be 44    Baseline 38    Time 8    Period Weeks    Status New    Target Date 08/21/21      PT LONG TERM GOAL #4   Title Patient to be able to walk x 3 min with O2 sats greater than 90%    Time 8    Period Weeks    Status New    Target Date 08/21/21      PT LONG TERM GOAL #5   Title .Marland KitchenMarland Kitchen                   Plan - 07/03/21 1449     Clinical Impression Statement Patient O2 sats remained WNL throughout entire treatment with oxygen.  She was able to do all activities from last visit in standing but did need rest break between each exercise with exception of doing two exercises in a row with the plyoball.  She is well motivated and should continue to do well.  She will likely need to wear her O2 during sessions initially until activity tolerance improves.    Personal Factors and Comorbidities Comorbidity 1;Comorbidity 2;Comorbidity 3+    Comorbidities obesity, CHF, HTN, diabetes, kidney disease    Examination-Activity Limitations Stand;Locomotion Level;Bathing;Hygiene/Grooming;Stairs;Carry;Dressing    Examination-Participation Restrictions Cleaning;Shop;Meal Prep;Community Activity;Driving    Stability/Clinical Decision Making Evolving/Moderate complexity    Rehab Potential Good    PT Frequency 2x / week    PT Duration 8 weeks    PT Treatment/Interventions ADLs/Self Care Home Management;Aquatic Therapy;Gait training;Stair training;Functional mobility training;Neuromuscular re-education;Balance training;Therapeutic exercise;Therapeutic activities;Patient/family education;Dry needling;Manual techniques    PT Next Visit Plan start HEP, monitor O2 and HR, NuStep; seated ex; limited standing tolerance  Consulted and Agree with Plan of Care Patient             Patient will benefit  from skilled therapeutic intervention in order to improve the following deficits and impairments:  Decreased balance, Abnormal gait, Decreased endurance, Difficulty walking, Cardiopulmonary status limiting activity, Decreased activity tolerance, Decreased strength, Impaired perceived functional ability, Obesity  Visit Diagnosis: Difficulty in walking, not elsewhere classified  Muscle weakness (generalized)  Chronic pain of right knee     Problem List Patient Active Problem List   Diagnosis Date Noted   Acute on chronic diastolic (congestive) heart failure ([place]) 03/14/2021   History of anxiety 03/03/2021   History of blood clotting disorder 03/03/2021   History of complications due to general anesthesia 03/03/2021   Acute on chronic diastolic CHF (congestive heart failure) ([place]) 01/30/2021   Lateral epicondylitis of right elbow 08/28/2020   Pain in joint of right elbow 07/15/2020   Calculus of gallbladder without cholecystitis without obstruction 09/28/2019   Infection and inflammatory reaction due to indwelling urethral catheter, subsequent encounter 09/28/2019   Nondisplaced fracture of lateral malleolus of right fibula, subsequent encounter for closed fracture with routine healing 09/28/2019   Hemorrhage of anus and rectum 08/07/2019   Unspecified protein-calorie malnutrition ([place]) 08/07/2019   Difficulty in walking, not elsewhere classified 07/18/2019   Dysphagia, oropharyngeal phase 07/18/2019   Chronic respiratory failure with hypoxia ([place]) 07/03/2019   Atypical atrial flutter ([facility])    Paroxysmal atrial fibrillation ([place]) 05/24/2018   Blister (nonthermal), unspecified lower leg, initial encounter 03/24/2018   Cellulitis, unspecified 03/24/2018   Diarrhea, unspecified 03/24/2018   Immune thrombocytopenic purpura ([place]) 03/24/2018   Major depressive disorder, recurrent, mild ([place]) 03/24/2018   Nausea with vomiting, unspecified 03/24/2018   Atherosclerotic heart disease of  native coronary artery without angina pectoris 01/13/2018   Candidal vulvovaginitis 01/13/2018   Dental caries, unspecified 01/13/2018   Hypertensive heart and chronic kidney disease with heart failure and stage 1 through stage 4 chronic kidney disease, or unspecified chronic kidney disease ([place]) 01/13/2018   Long term (current) use of insulin ([place]) 01/13/2018   Muscle weakness (generalized) 01/13/2018   Other adverse food reactions, not elsewhere classified, initial encounter 01/13/2018   Other chronic sinusitis 01/13/2018   Postprocedural hypothyroidism 01/13/2018   Unsteadiness on feet 01/13/2018   Urinary tract infection, site not specified 01/13/2018   Normocytic anemia 05/18/2017   Chronic ITP (idiopathic thrombocytopenia) ([place]) 01/25/2017   Hypercalciuria 01/15/2016   Hyperoxaluria 01/15/2016   Chronic kidney disease (CKD) stage G4/A1, severely decreased glomerular filtration rate (GFR) between 15-29 mL/min/1.73 square meter and albuminuria creatinine ratio less than 30 mg/g ([place]) 09/04/2015   Acute on chronic respiratory failure with hypoxia ([place]) 08/24/2015   Decreased urine volume 07/10/2015   Hypocitraturia 07/10/2015   Physical deconditioning 10/11/2014   Latex allergy, contact dermatitis 10/09/2014   Primary osteoarthritis of left knee 09/17/2014   Clostridium difficile carrier 06/07/2014   Constipation, unspecified 06/07/2012   Nutritional deficiency, unspecified 06/07/2012   Tachycardia 06/07/2012   Dyslipidemia 02/12/2012   GERD (gastroesophageal reflux disease) 02/12/2012   DM type 2 (diabetes mellitus, type 2) ([place]) 08/05/2010   GOUT 06/23/2010   DERMATITIS 01/16/2010   BACK PAIN 06/13/2009   MYALGIA 06/13/2009   Anxiety state 12/13/2008   LEG EDEMA, CHRONIC 11/08/2008   Sleep apnea 10/26/2008   Hypothyroidism 09/27/2008   HYPERCHOLESTEROLEMIA 09/27/2008   OBESITY 09/27/2008   Depression, recurrent ([place]) 09/27/2008   Essential hypertension 09/27/2008    Allergic rhinitis 09/27/2008  ASTHMA, CHILDHOOD 09/27/2008    Marveen Reeks. Ai Sonnenfeld, PT 10/27/222:58 PM   [facility] @ [place] [address] [place] [address] [place], [place], [address] Phone: [phone]   Fax:  [phone]  Name: [name] MRN: [id number] Date of Birth: [birth year]

## 2021-07-10 ENCOUNTER — Ambulatory Visit: Payer: Medicare Other | Attending: Family Medicine | Admitting: Physical Therapy

## 2021-07-10 ENCOUNTER — Other Ambulatory Visit: Payer: Self-pay

## 2021-07-10 DIAGNOSIS — G8929 Other chronic pain: Secondary | ICD-10-CM | POA: Diagnosis not present

## 2021-07-10 DIAGNOSIS — M25561 Pain in right knee: Secondary | ICD-10-CM | POA: Diagnosis not present

## 2021-07-10 DIAGNOSIS — R2689 Other abnormalities of gait and mobility: Secondary | ICD-10-CM | POA: Diagnosis not present

## 2021-07-10 DIAGNOSIS — M6281 Muscle weakness (generalized): Secondary | ICD-10-CM | POA: Insufficient documentation

## 2021-07-10 DIAGNOSIS — R262 Difficulty in walking, not elsewhere classified: Secondary | ICD-10-CM | POA: Insufficient documentation

## 2021-07-10 NOTE — Therapy (Signed)
[facility] @ [place] [place], [place], [address] Phone: [phone]   Fax:  [phone]  Physical Therapy Treatment  Patient Details  Name: [name] MRN: [id number] Date of Birth: November 20, 1934 Referring Provider (PT): Micheline Rough, MD   Encounter Date: 07/10/2021   PT End of Session - 07/10/21 1725     Visit Number 4    Number of Visits 10    Date for PT Re-Evaluation 08/21/21    Authorization Type UHC MC and BCBS    PT Start Time 6945    PT Stop Time 0388    PT Time Calculation (min) 45 min    Activity Tolerance Other (comment)             Past Medical History:  Diagnosis Date   Allergic rhinitis    Anxiety state, unspecified    panic attacks   CHF (congestive heart failure) ([place])    Depressive disorder, not elsewhere classified    Disorientation, unspecified 08/07/2019   Epistaxis 03/24/2018   Extrinsic asthma, unspecified    no problem since adulthood   History of complications due to general anesthesia 03/03/2021   History of pulmonary embolism 08/07/2019   Hypertensive urgency 01/30/2021   Obesity    OSA on CPAP    severe   Pneumonitis 07/14/2019   Pressure injury of skin 07/10/2019   Pulmonary embolism ([place])    Pure hypercholesterolemia    Respiratory failure with hypoxia ([place]) 09/2008   acute, secondary to multiple bilateral pulmonary embolism , negative hypercoagulable workup 09/2008 hospital stay   Scoliosis    Type II or unspecified type diabetes mellitus without mention of complication, not stated as uncontrolled    Unspecified hypothyroidism    hypo    Past Surgical History:  Procedure Laterality Date   CARDIOVERSION N/A 06/26/2019   Procedure: CARDIOVERSION;  Surgeon: Jerline Pain, MD;  Location: [facility];  Service: Cardiovascular;  Laterality: N/A;   EYE SURGERY Bilateral 2005   cataracts, implants   KIDNEY STONE SURGERY Bilateral    2016, 2017    knee replaced  Left 2013   THYROID SURGERY  1966   nodule removal; partial thyroidectomy; radioactive iodine x2; 1990's    There were no vitals filed for this visit.   Subjective Assessment - 07/10/21 1449     Subjective Did better with O2 last time so using today.    Pertinent History CHF  grandson PT at [facility]    Limitations Walking;House hold activities;Lifting;Standing    Patient Stated Goals I want to walk so much; eventually off walker to cane    Currently in Pain? No/denies    Pain Score 0-No pain                               [facility] Adult PT Treatment/Exercise - 07/10/21 0001       Ambulation/Gait   Gait Comments 45 feet pushing W/C x2 on 1-2L of O2      Lumbar Exercises: Seated   Sit to Stand Limitations chair sit ups holding 5# kettlebell 10x    Other Seated Lumbar Exercises green band rows 10x    Other Seated Lumbar Exercises 5# kettlebell pull ups 10x      Knee/Hip Exercises: Aerobic   Nustep 6 min L1 seat 7, arms 6      Knee/Hip Exercises: Standing   Heel Raises Both;10 reps  Other Standing Knee Exercises hip flexion 5x right/left   bil UE support     Knee/Hip Exercises: Seated   Other Seated Knee/Hip Exercises 5# kettlebell on thigh hip flexion 10x right/left    Sit to Sand 1 set;5 reps   holding 5# kettlebell                      PT Short Term Goals - 06/26/21 1633       PT SHORT TERM GOAL #1   Title be independent in initial HEP    Time 4    Period Weeks    Status New    Target Date 07/24/21      PT SHORT TERM GOAL #2   Title TUG to be improved to 21 sec    Baseline 25.94 sec    Time 4    Period Weeks    Status New    Target Date 07/24/21      PT SHORT TERM GOAL #3   Title BERG score to be 41    Baseline 38    Time 4    Period Weeks    Status New    Target Date 07/24/21               PT Long Term Goals - 06/26/21 1635       PT LONG TERM GOAL #1   Title be independent in advanced HEP    Baseline not issued     Time 8    Period Weeks    Status New    Target Date 08/21/21      PT LONG TERM GOAL #2   Title Pt will improve 5 times sit<>stand to <13 sec for decreased fall risk    Baseline 15.81 sec    Time 8    Period Weeks    Status New    Target Date 08/21/21      PT LONG TERM GOAL #3   Title BERG score to be 44    Baseline 38    Time 8    Period Weeks    Status New    Target Date 08/21/21      PT LONG TERM GOAL #4   Title Patient to be able to walk x 3 min with O2 sats greater than 90%    Time 8    Period Weeks    Status New    Target Date 08/21/21      PT LONG TERM GOAL #5   Title .Marland KitchenMarland Kitchen                   Plan - 07/10/21 1726     Clinical Impression Statement The patient presents in W/C and wearing O2 today.  She is able to perform seated ex's with relative ease however standing ex's are much more taxing.  Short of breath but O2s remain above 91%.  More shortness of breath with gait and O2 at 1L on first lap, 2nd lap with 2L much improved.  Glute medius weakness noted on right with pelvic drop noted with standing exercises.  Therapist monitoring response throughout treatment session.    Personal Factors and Comorbidities Comorbidity 1;Comorbidity 2;Comorbidity 3+    Comorbidities obesity, CHF, HTN, diabetes, kidney disease    Examination-Activity Limitations Stand;Locomotion Level;Bathing;Hygiene/Grooming;Stairs;Carry;Dressing    Examination-Participation Restrictions Cleaning;Shop;Meal Prep;Community Activity;Driving    Rehab Potential Good    PT Frequency 2x / week    PT Duration 8 weeks  PT Treatment/Interventions ADLs/Self Care Home Management;Aquatic Therapy;Gait training;Stair training;Functional mobility training;Neuromuscular re-education;Balance training;Therapeutic exercise;Therapeutic activities;Patient/family education;Dry needling;Manual techniques    PT Next Visit Plan monitor O2 and HR, NuStep; seated ex; limited standing tolerance; gait endurance              Patient will benefit from skilled therapeutic intervention in order to improve the following deficits and impairments:  Decreased balance, Abnormal gait, Decreased endurance, Difficulty walking, Cardiopulmonary status limiting activity, Decreased activity tolerance, Decreased strength, Impaired perceived functional ability, Obesity  Visit Diagnosis: Difficulty in walking, not elsewhere classified  Muscle weakness (generalized)  Chronic pain of right knee  Other abnormalities of gait and mobility     Problem List Patient Active Problem List   Diagnosis Date Noted   Acute on chronic diastolic (congestive) heart failure ([place]) 03/14/2021   History of anxiety 03/03/2021   History of blood clotting disorder 03/03/2021   History of complications due to general anesthesia 03/03/2021   Acute on chronic diastolic CHF (congestive heart failure) ([place]) 01/30/2021   Lateral epicondylitis of right elbow 08/28/2020   Pain in joint of right elbow 07/15/2020   Calculus of gallbladder without cholecystitis without obstruction 09/28/2019   Infection and inflammatory reaction due to indwelling urethral catheter, subsequent encounter 09/28/2019   Nondisplaced fracture of lateral malleolus of right fibula, subsequent encounter for closed fracture with routine healing 09/28/2019   Hemorrhage of anus and rectum 08/07/2019   Unspecified protein-calorie malnutrition ([place]) 08/07/2019   Difficulty in walking, not elsewhere classified 07/18/2019   Dysphagia, oropharyngeal phase 07/18/2019   Chronic respiratory failure with hypoxia ([place]) 07/03/2019   Atypical atrial flutter ([facility])    Paroxysmal atrial fibrillation ([place]) 05/24/2018   Blister (nonthermal), unspecified lower leg, initial encounter 03/24/2018   Cellulitis, unspecified 03/24/2018   Diarrhea, unspecified 03/24/2018   Immune thrombocytopenic purpura ([place]) 03/24/2018   Major depressive disorder, recurrent, mild ([place]) 03/24/2018   Nausea  with vomiting, unspecified 03/24/2018   Atherosclerotic heart disease of native coronary artery without angina pectoris 01/13/2018   Candidal vulvovaginitis 01/13/2018   Dental caries, unspecified 01/13/2018   Hypertensive heart and chronic kidney disease with heart failure and stage 1 through stage 4 chronic kidney disease, or unspecified chronic kidney disease ([place]) 01/13/2018   Long term (current) use of insulin ([place]) 01/13/2018   Muscle weakness (generalized) 01/13/2018   Other adverse food reactions, not elsewhere classified, initial encounter 01/13/2018   Other chronic sinusitis 01/13/2018   Postprocedural hypothyroidism 01/13/2018   Unsteadiness on feet 01/13/2018   Urinary tract infection, site not specified 01/13/2018   Normocytic anemia 05/18/2017   Chronic ITP (idiopathic thrombocytopenia) ([place] [place]) 01/25/2017   Hypercalciuria 01/15/2016   Hyperoxaluria 01/15/2016   Chronic kidney disease (CKD) stage G4/A1, severely decreased glomerular filtration rate (GFR) between 15-29 mL/min/1.73 square meter and albuminuria creatinine ratio less than 30 mg/g ([place]) 09/04/2015   Acute on chronic respiratory failure with hypoxia ([place]) 08/24/2015   Decreased urine volume 07/10/2015   Hypocitraturia 07/10/2015   Physical deconditioning 10/11/2014   Latex allergy, contact dermatitis 10/09/2014   Primary osteoarthritis of left knee 09/17/2014   Clostridium difficile carrier 06/07/2014   Constipation, unspecified 06/07/2012   Nutritional deficiency, unspecified 06/07/2012   Tachycardia 06/07/2012   Dyslipidemia 02/12/2012   GERD (gastroesophageal reflux disease) 02/12/2012   DM type 2 (diabetes mellitus, type 2) ([place]) 08/05/2010   GOUT 06/23/2010   DERMATITIS 01/16/2010   BACK PAIN 06/13/2009   MYALGIA 06/13/2009   Anxiety state 12/13/2008   LEG EDEMA, CHRONIC  11/08/2008   Sleep apnea 10/26/2008   Hypothyroidism 09/27/2008   HYPERCHOLESTEROLEMIA 09/27/2008   OBESITY 09/27/2008    Depression, recurrent ([place]) 09/27/2008   Essential hypertension 09/27/2008   Allergic rhinitis 09/27/2008   ASTHMA, CHILDHOOD 09/27/2008   Ruben Im, PT 07/10/21 5:34 PM Phone: [phone] Fax: [phone]  Alvera Singh, PT 07/10/2021, 5:34 PM  [facility] @ [address] [place], [place], [address] Phone: [phone]   Fax:  [phone]  Name: Juliana Boling MRN: [id number] Date of Birth: Dec 17, 1934

## 2021-07-15 ENCOUNTER — Ambulatory Visit: Payer: Medicare Other | Admitting: Physical Therapy

## 2021-07-15 ENCOUNTER — Other Ambulatory Visit: Payer: Self-pay

## 2021-07-15 DIAGNOSIS — R262 Difficulty in walking, not elsewhere classified: Secondary | ICD-10-CM | POA: Diagnosis not present

## 2021-07-15 DIAGNOSIS — R2689 Other abnormalities of gait and mobility: Secondary | ICD-10-CM

## 2021-07-15 DIAGNOSIS — M25561 Pain in right knee: Secondary | ICD-10-CM | POA: Diagnosis not present

## 2021-07-15 DIAGNOSIS — M6281 Muscle weakness (generalized): Secondary | ICD-10-CM

## 2021-07-15 DIAGNOSIS — G8929 Other chronic pain: Secondary | ICD-10-CM

## 2021-07-15 NOTE — Therapy (Signed)
[facility] @ [address] [place], [place], [address] Phone: [phone]   Fax:  [phone]  Physical Therapy Treatment  Patient Details  Name: [name] MRN: [id number] Date of Birth: [birth year] Referring Provider (PT): Micheline Rough, MD   Encounter Date: 07/15/2021   PT End of Session - 07/15/21 1450     Visit Number 5    Number of Visits 10    Date for PT Re-Evaluation 08/21/21    Authorization Type UHC MC and BCBS    PT Start Time 1400    PT Stop Time 9449    PT Time Calculation (min) 43 min    Activity Tolerance Patient tolerated treatment well             Past Medical History:  Diagnosis Date   Allergic rhinitis    Anxiety state, unspecified    panic attacks   CHF (congestive heart failure) ([place])    Depressive disorder, not elsewhere classified    Disorientation, unspecified 08/07/2019   Epistaxis 03/24/2018   Extrinsic asthma, unspecified    no problem since adulthood   History of complications due to general anesthesia 03/03/2021   History of pulmonary embolism 08/07/2019   Hypertensive urgency 01/30/2021   Obesity    OSA on CPAP    severe   Pneumonitis 07/14/2019   Pressure injury of skin 07/10/2019   Pulmonary embolism ([place])    Pure hypercholesterolemia    Respiratory failure with hypoxia ([place]) 09/2008   acute, secondary to multiple bilateral pulmonary embolism , negative hypercoagulable workup 09/2008 hospital stay   Scoliosis    Type II or unspecified type diabetes mellitus without mention of complication, not stated as uncontrolled    Unspecified hypothyroidism    hypo    Past Surgical History:  Procedure Laterality Date   CARDIOVERSION N/A 06/26/2019   Procedure: CARDIOVERSION;  Surgeon: Jerline Pain, MD;  Location: [facility];  Service: Cardiovascular;  Laterality: N/A;   EYE SURGERY Bilateral 2005   cataracts, implants   KIDNEY STONE SURGERY Bilateral    2016, 2017     knee replaced Left 2013   THYROID SURGERY  1966   nodule removal; partial thyroidectomy; radioactive iodine x2; 1990's    There were no vitals filed for this visit.   Subjective Assessment - 07/15/21 1409     Subjective I did well last time.  My knee is feeling weird today.  Using 2L O2 and in wheelchair on arrival.    Pertinent History CHF  grandson PT at [facility]    Limitations Walking;House hold activities;Lifting;Standing    Patient Stated Goals I want to walk so much; eventually off walker to cane    Currently in Pain? No/denies    Pain Score 0-No pain                               [facility] Adult PT Treatment/Exercise - 07/15/21 0001       Ambulation/Gait   Gait Comments 60 feet pushing wheelchair with 2 L O2      Lumbar Exercises: Seated   Sit to Stand Limitations chair sit ups holding 5# kettlebell 10x    Other Seated Lumbar Exercises 5# weight hip to hip; golf swings and ear to ear 5-6 x each      Knee/Hip Exercises: Aerobic   Nustep 7 min L1 seat 7, arms 6      Knee/Hip  Exercises: Standing   Other Standing Knee Exercises hip flexion 5x right/left   bil UE support   Other Standing Knee Exercises forward and lateral stepping over flat cone 5x right/left      Knee/Hip Exercises: Seated   Other Seated Knee/Hip Exercises 5# weight on thigh with heel raise 10x right/left      Shoulder Exercises: ROM/Strengthening   Other ROM/Strengthening Exercises push ups on parallel bar 10x    Other ROM/Strengthening Exercises 2# overhead press seated: 2 sets of 5 on each side                       PT Short Term Goals - 06/26/21 1633       PT SHORT TERM GOAL #1   Title be independent in initial HEP    Time 4    Period Weeks    Status New    Target Date 07/24/21      PT SHORT TERM GOAL #2   Title TUG to be improved to 21 sec    Baseline 25.94 sec    Time 4    Period Weeks    Status New    Target Date 07/24/21      PT SHORT TERM GOAL #3    Title BERG score to be 41    Baseline 38    Time 4    Period Weeks    Status New    Target Date 07/24/21               PT Long Term Goals - 06/26/21 1635       PT LONG TERM GOAL #1   Title be independent in advanced HEP    Baseline not issued    Time 8    Period Weeks    Status New    Target Date 08/21/21      PT LONG TERM GOAL #2   Title Pt will improve 5 times sit<>stand to <13 sec for decreased fall risk    Baseline 15.81 sec    Time 8    Period Weeks    Status New    Target Date 08/21/21      PT LONG TERM GOAL #3   Title BERG score to be 44    Baseline 38    Time 8    Period Weeks    Status New    Target Date 08/21/21      PT LONG TERM GOAL #4   Title Patient to be able to walk x 3 min with O2 sats greater than 90%    Time 8    Period Weeks    Status New    Target Date 08/21/21      PT LONG TERM GOAL #5   Title .Marland KitchenMarland Kitchen                   Plan - 07/15/21 1412     Clinical Impression Statement The patient has moderate shortness of breath after ambulation but  saturation level stable at 93%.  Short rest breaks needed between ex's for recovery particularly with standing ex's.  Limited repetitions primarily due to respiratory status.  She states her knee feels better during exercise.  She  remains highly motivated with PT.    Comorbidities obesity, CHF, HTN, diabetes, kidney disease    Examination-Activity Limitations Stand;Locomotion Level;Bathing;Hygiene/Grooming;Stairs;Carry;Dressing    Examination-Participation Restrictions Cleaning;Shop;Meal Prep;Community Activity;Driving    Rehab Potential Good    PT Frequency 2x /  week    PT Duration 8 weeks    PT Treatment/Interventions ADLs/Self Care Home Management;Aquatic Therapy;Gait training;Stair training;Functional mobility training;Neuromuscular re-education;Balance training;Therapeutic exercise;Therapeutic activities;Patient/family education;Dry needling;Manual techniques    PT Next Visit Plan  monitor O2 and HR, NuStep; seated ex; limited standing tolerance; gait endurance    Consulted and Agree with Plan of Care Patient             Patient will benefit from skilled therapeutic intervention in order to improve the following deficits and impairments:  Decreased balance, Abnormal gait, Decreased endurance, Difficulty walking, Cardiopulmonary status limiting activity, Decreased activity tolerance, Decreased strength, Impaired perceived functional ability, Obesity  Visit Diagnosis: Difficulty in walking, not elsewhere classified  Muscle weakness (generalized)  Chronic pain of right knee  Other abnormalities of gait and mobility     Problem List Patient Active Problem List   Diagnosis Date Noted   Acute on chronic diastolic (congestive) heart failure ([place]) 03/14/2021   History of anxiety 03/03/2021   History of blood clotting disorder 03/03/2021   History of complications due to general anesthesia 03/03/2021   Acute on chronic diastolic CHF (congestive heart failure) ([place]) 01/30/2021   Lateral epicondylitis of right elbow 08/28/2020   Pain in joint of right elbow 07/15/2020   Calculus of gallbladder without cholecystitis without obstruction 09/28/2019   Infection and inflammatory reaction due to indwelling urethral catheter, subsequent encounter 09/28/2019   Nondisplaced fracture of lateral malleolus of right fibula, subsequent encounter for closed fracture with routine healing 09/28/2019   Hemorrhage of anus and rectum 08/07/2019   Unspecified protein-calorie malnutrition ([place]) 08/07/2019   Difficulty in walking, not elsewhere classified 07/18/2019   Dysphagia, oropharyngeal phase 07/18/2019   Chronic respiratory failure with hypoxia ([place]) 07/03/2019   Atypical atrial flutter ([facility])    Paroxysmal atrial fibrillation ([place]) 05/24/2018   Blister (nonthermal), unspecified lower leg, initial encounter 03/24/2018   Cellulitis, unspecified 03/24/2018   Diarrhea,  unspecified 03/24/2018   Immune thrombocytopenic purpura ([place]) 03/24/2018   Major depressive disorder, recurrent, mild ([place]) 03/24/2018   Nausea with vomiting, unspecified 03/24/2018   Atherosclerotic heart disease of native coronary artery without angina pectoris 01/13/2018   Candidal vulvovaginitis 01/13/2018   Dental caries, unspecified 01/13/2018   Hypertensive heart and chronic kidney disease with heart failure and stage 1 through stage 4 chronic kidney disease, or unspecified chronic kidney disease ([place]) 01/13/2018   Long term (current) use of insulin ([place]) 01/13/2018   Muscle weakness (generalized) 01/13/2018   Other adverse food reactions, not elsewhere classified, initial encounter 01/13/2018   Other chronic sinusitis 01/13/2018   Postprocedural hypothyroidism 01/13/2018   Unsteadiness on feet 01/13/2018   Urinary tract infection, site not specified 01/13/2018   Normocytic anemia 05/18/2017   Chronic ITP (idiopathic thrombocytopenia) ([place]) 01/25/2017   Hypercalciuria 01/15/2016   Hyperoxaluria 01/15/2016   Chronic kidney disease (CKD) stage G4/A1, severely decreased glomerular filtration rate (GFR) between 15-29 mL/min/1.73 square meter and albuminuria creatinine ratio less than 30 mg/g ([place]) 09/04/2015   Acute on chronic respiratory failure with hypoxia ([place]) 08/24/2015   Decreased urine volume 07/10/2015   Hypocitraturia 07/10/2015   Physical deconditioning 10/11/2014   Latex allergy, contact dermatitis 10/09/2014   Primary osteoarthritis of left knee 09/17/2014   Clostridium difficile carrier 06/07/2014   Constipation, unspecified 06/07/2012   Nutritional deficiency, unspecified 06/07/2012   Tachycardia 06/07/2012   Dyslipidemia 02/12/2012   GERD (gastroesophageal reflux disease) 02/12/2012   DM type 2 (diabetes mellitus, type 2) ([place]) 08/05/2010   GOUT 06/23/2010  DERMATITIS 01/16/2010   BACK PAIN 06/13/2009   MYALGIA 06/13/2009   Anxiety state 12/13/2008   LEG  EDEMA, CHRONIC 11/08/2008   Sleep apnea 10/26/2008   Hypothyroidism 09/27/2008   HYPERCHOLESTEROLEMIA 09/27/2008   OBESITY 09/27/2008   Depression, recurrent ([place]) 09/27/2008   Essential hypertension 09/27/2008   Allergic rhinitis 09/27/2008   ASTHMA, CHILDHOOD 09/27/2008   Ruben Im, PT 07/15/21 2:59 PM Phone: [phone] Fax: [phone]  Alvera Singh, PT 07/15/2021, 2:59 PM  [facility] @ [place] [place], [place], [address] Phone: [phone]   Fax:  [phone]  Name: [name] MRN: [id number] Date of Birth: Dec 01, 1934

## 2021-07-16 ENCOUNTER — Encounter: Payer: Self-pay | Admitting: Family

## 2021-07-16 ENCOUNTER — Inpatient Hospital Stay (HOSPITAL_BASED_OUTPATIENT_CLINIC_OR_DEPARTMENT_OTHER): Payer: Medicare Other | Admitting: Family

## 2021-07-16 ENCOUNTER — Inpatient Hospital Stay: Payer: Medicare Other | Attending: Hematology & Oncology

## 2021-07-16 ENCOUNTER — Inpatient Hospital Stay: Payer: Medicare Other

## 2021-07-16 VITALS — BP 173/51 | HR 52 | Temp 98.0°F | Resp 18 | Wt 222.8 lb

## 2021-07-16 DIAGNOSIS — D693 Immune thrombocytopenic purpura: Secondary | ICD-10-CM

## 2021-07-16 DIAGNOSIS — D509 Iron deficiency anemia, unspecified: Secondary | ICD-10-CM | POA: Diagnosis not present

## 2021-07-16 DIAGNOSIS — D500 Iron deficiency anemia secondary to blood loss (chronic): Secondary | ICD-10-CM | POA: Diagnosis not present

## 2021-07-16 LAB — CMP (CANCER CENTER ONLY)
ALT: 11 U/L (ref 0–44)
AST: 12 U/L — ABNORMAL LOW (ref 15–41)
Albumin: 4.1 g/dL (ref 3.5–5.0)
Alkaline Phosphatase: 56 U/L (ref 38–126)
Anion gap: 9 (ref 5–15)
BUN: 59 mg/dL — ABNORMAL HIGH (ref 8–23)
CO2: 33 mmol/L — ABNORMAL HIGH (ref 22–32)
Calcium: 10.3 mg/dL (ref 8.9–10.3)
Chloride: 99 mmol/L (ref 98–111)
Creatinine: 2.4 mg/dL — ABNORMAL HIGH (ref 0.44–1.00)
GFR, Estimated: 20 mL/min — ABNORMAL LOW (ref >60)
Glucose, Bld: 162 mg/dL — ABNORMAL HIGH (ref 70–99)
Potassium: 4.3 mmol/L (ref 3.5–5.1)
Sodium: 141 mmol/L (ref 135–145)
Total Bilirubin: 0.6 mg/dL (ref 0.3–1.2)
Total Protein: 6.9 g/dL (ref 6.5–8.1)

## 2021-07-16 LAB — CBC WITH DIFFERENTIAL (CANCER CENTER ONLY)
Abs Immature Granulocytes: 0.02 10*3/uL (ref 0.00–0.07)
Basophils Absolute: 0 10*3/uL (ref 0.0–0.1)
Basophils Relative: 0 %
Eosinophils Absolute: 0.2 10*3/uL (ref 0.0–0.5)
Eosinophils Relative: 3 %
HCT: 32.7 % — ABNORMAL LOW (ref 36.0–46.0)
Hemoglobin: 10.7 g/dL — ABNORMAL LOW (ref 12.0–15.0)
Immature Granulocytes: 0 %
Lymphocytes Relative: 17 %
Lymphs Abs: 0.9 10*3/uL (ref 0.7–4.0)
MCH: 31.2 pg (ref 26.0–34.0)
MCHC: 32.7 g/dL (ref 30.0–36.0)
MCV: 95.3 fL (ref 80.0–100.0)
Monocytes Absolute: 0.4 10*3/uL (ref 0.1–1.0)
Monocytes Relative: 8 %
Neutro Abs: 3.8 10*3/uL (ref 1.7–7.7)
Neutrophils Relative %: 72 %
Platelet Count: 112 10*3/uL — ABNORMAL LOW (ref 150–400)
RBC: 3.43 MIL/uL — ABNORMAL LOW (ref 3.87–5.11)
RDW: 16.1 % — ABNORMAL HIGH (ref 11.5–15.5)
WBC Count: 5.4 10*3/uL (ref 4.0–10.5)
nRBC: 0 % (ref 0.0–0.2)

## 2021-07-16 LAB — RETICULOCYTES
Immature Retic Fract: 14.5 % (ref 2.3–15.9)
RBC.: 3.46 MIL/uL — ABNORMAL LOW (ref 3.87–5.11)
Retic Count, Absolute: 80.3 10*3/uL (ref 19.0–186.0)
Retic Ct Pct: 2.3 % (ref 0.4–3.1)

## 2021-07-16 MED ORDER — ROMIPLOSTIM 125 MCG ~~LOC~~ SOLR
1.0000 ug/kg | Freq: Once | SUBCUTANEOUS | Status: AC
  Administered 2021-07-16: 100 ug via SUBCUTANEOUS
  Filled 2021-07-16: qty 0.2

## 2021-07-16 NOTE — Patient Instructions (Signed)
Romiplostim injection ?What is this medication? ?ROMIPLOSTIM (roe mi PLOE stim) helps your body make more platelets. This medicine is used to treat low platelets caused by chronic idiopathic thrombocytopenic purpura (ITP) or a bone marrow syndrome caused by radiation sickness. ?This medicine may be used for other purposes; ask your health care provider or pharmacist if you have questions. ?COMMON BRAND NAME(S): Nplate ?What should I tell my care team before I take this medication? ?They need to know if you have any of these conditions: ?blood clots ?myelodysplastic syndrome ?an unusual or allergic reaction to romiplostim, mannitol, other medicines, foods, dyes, or preservatives ?pregnant or trying to get pregnant ?breast-feeding ?How should I use this medication? ?This medicine is injected under the skin. It is given by a health care provider in a hospital or clinic setting. ?A special MedGuide will be given to you before each treatment. Be sure to read this information carefully each time. ?Talk to your health care provider about the use of this medicine in children. While it may be prescribed for children as young as newborns for selected conditions, precautions do apply. ?Overdosage: If you think you have taken too much of this medicine contact a poison control center or emergency room at once. ?NOTE: This medicine is only for you. Do not share this medicine with others. ?What if I miss a dose? ?Keep appointments for follow-up doses. It is important not to miss your dose. Call your health care provider if you are unable to keep an appointment. ?What may interact with this medication? ?Interactions are not expected. ?This list may not describe all possible interactions. Give your health care provider a list of all the medicines, herbs, non-prescription drugs, or dietary supplements you use. Also tell them if you smoke, drink alcohol, or use illegal drugs. Some items may interact with your medicine. ?What should I  watch for while using this medication? ?Visit your health care provider for regular checks on your progress. You may need blood work done while you are taking this medicine. Your condition will be monitored carefully while you are receiving this medicine. It is important not to miss any appointments. ?What side effects may I notice from receiving this medication? ?Side effects that you should report to your doctor or health care professional as soon as possible: ?allergic reactions (skin rash, itching or hives; swelling of the face, lips, or tongue) ?bleeding (bloody or black, tarry stools; red or dark brown urine; spitting up blood or brown material that looks like coffee grounds; red spots on the skin; unusual bruising or bleeding from the eyes, gums, or nose) ?blood clot (chest pain; shortness of breath; pain, swelling, or warmth in the leg) ?stroke (changes in vision; confusion; trouble speaking or understanding; severe headaches; sudden numbness or weakness of the face, arm or leg; trouble walking; dizziness; loss of balance or coordination) ?Side effects that usually do not require medical attention (report to your doctor or health care professional if they continue or are bothersome): ?diarrhea ?dizziness ?headache ?joint pain ?muscle pain ?stomach pain ?trouble sleeping ?This list may not describe all possible side effects. Call your doctor for medical advice about side effects. You may report side effects to FDA at 1-800-FDA-1088. ?Where should I keep my medication? ?This medicine is given in a hospital or clinic. It will not be stored at home. ?NOTE: This sheet is a summary. It may not cover all possible information. If you have questions about this medicine, talk to your doctor, pharmacist, or health care provider. ??   2022 Elsevier/Gold Standard (2021-05-13 00:00:00) ? ?

## 2021-07-16 NOTE — Progress Notes (Signed)
Hematology and Oncology Follow Up Visit  [name] [id number] [birth year] 86 y.o. 07/16/2021   Principle Diagnosis:  Immune based thrombocytopenia History of PE Iron deficiency anemia Atrial fib   Current Therapy:        Nplate q 3 week for platelet count < 100K  IV Iron as indicated    Interim History:  Ms. Signer is here today for follow-up and Nplate injection. She is doing well and feeling much since starting PT 3 weeks ago. She is going twice a week to a very nice new facility. She notes that her energy and stamina seem to be improving.  Platelets are stable at 112, Hgb 10.7, MCV 95 and WBC count 5.4.  She has not noted any blood loss. No bruising or petechiae.  She feels that her SOB is stable to improved and she is only using 2L [place] as needed.  No issues with infection. No fever, chills, n/v, cough, rash, dizziness, SOB, chest pain, palpitations, abdominal pain or changes in bowel or bladder habits.  The swelling in her lower extremities is stable. She is wearing compression wraps to help reduce swelling and add support.  Pedal pulses are 2+.  No falls or syncope to report.  She notes that since she has been exercising her appetite seems to be better. She is hydrating properly throughout the day. Her weight is 222 lbs.   ECOG Performance Status: 2 - Symptomatic, <50% confined to bed  Medications:  Allergies as of 07/16/2021       Reactions   Adhesive [tape] Other (See Comments)   PULLS OFF THE SKIN   Apixaban Other (See Comments)   Internal Bleeding   Aspirin Itching, Rash, Hives, Swelling   Swelling of her tongue   Mirabegron Other (See Comments)   Patient experienced A-Fib   Pineapple Anaphylaxis, Swelling   Throat swells and blisters on tongue and roof of mouth per patient   Metformin Diarrhea, Nausea Only   Tetracycline Hives   Fluticasone-salmeterol Itching, Rash   Iodinated Diagnostic Agents Itching, Rash      Lactose Intolerance (gi) Other (See  Comments)   Gas   Latex Itching, Rash, Other (See Comments)   Pulls off the skin and causes welts   Lisinopril Cough   Metronidazole Other (See Comments)   Reaction not known   Sulfa Antibiotics Rash   Sulfonamide Derivatives Itching, Rash        Medication List        Accurate as of July 16, 2021  2:40 PM. If you have any questions, ask your nurse or doctor.          acetaminophen 325 MG tablet Commonly known as: TYLENOL Take 650 mg by mouth in the morning and at bedtime.   amLODipine 2.5 MG tablet Commonly known as: NORVASC Take 3 tablets (7.5 mg total) by mouth daily.   amoxicillin 500 MG tablet Commonly known as: AMOXIL Take 2,000 mg by mouth as needed. Prior to Dental Appt   atorvastatin 40 MG tablet Commonly known as: LIPITOR TAKE 1 TABLET(40 MG) BY MOUTH AT BEDTIME   BD Pen Needle Nano 2nd Gen 32G X 4 MM Misc Generic drug: Insulin Pen Needle USE TO INJECT UP TO FOUR TIMES DAILY AS DIRECTED   bisacodyl 5 MG EC tablet Commonly known as: DULCOLAX Take 10 mg by mouth every 3 (three) days as needed (for constipation).   fexofenadine 180 MG tablet Commonly known as: ALLEGRA Take 180 mg by mouth daily.  FreeStyle Libre 14 Day Sensor Misc APPLY NEW SENSOR EVERY 14 DAYS AS DIRECTED   hyoscyamine 0.125 MG tablet Commonly known as: LEVSIN Take 1 tablet by mouth daily.   insulin lispro 100 UNIT/ML KwikPen Commonly known as: HumaLOG KwikPen INJECT 13 TO 20 UNITS UNDER THE SKIN THREE TIMES DAILY WITH BREAKFAST, LUNCH AND EVENING MEAL   INSULIN SYRINGE .3CC/29GX1/2" 29G X 1/2" 0.3 ML Misc Use to inject insulin 4 times a day.   Lantus SoloStar 100 UNIT/ML Solostar Pen Generic drug: insulin glargine Inject 46 Units into the skin at bedtime.   levothyroxine 112 MCG tablet Commonly known as: SYNTHROID TAKE 1 TABLET(112 MCG) BY MOUTH DAILY   multivitamin tablet Take 1 tablet by mouth daily.   sertraline 50 MG tablet Commonly known as: ZOLOFT TAKE 1  TABLET BY MOUTH AT BEDTIME   torsemide 20 MG tablet Commonly known as: DEMADEX Take 40 mg by mouth every morning.   Trulicity 8.17 RN/1.6FB Sopn Generic drug: Dulaglutide Inject 0.75 mg into the skin once a week.   vitamin B-12 100 MCG tablet Commonly known as: CYANOCOBALAMIN Take 100 mcg by mouth daily.   vitamin C 250 MG tablet Commonly known as: ASCORBIC ACID Take 250 mg by mouth daily.   Vitamin D3 50 MCG (2000 UT) Tabs Take 5,000 Units by mouth daily.   Xeroform Petrolat Patch 4"x4" Pads Apply 1 Units topically daily as needed.        Allergies:  Allergies  Allergen Reactions   Adhesive [Tape] Other (See Comments)    PULLS OFF THE SKIN   Apixaban Other (See Comments)    Internal Bleeding   Aspirin Itching, Rash, Hives and Swelling    Swelling of her tongue   Mirabegron Other (See Comments)    Patient experienced A-Fib   Pineapple Anaphylaxis and Swelling    Throat swells and blisters on tongue and roof of mouth per patient   Metformin Diarrhea and Nausea Only   Tetracycline Hives   Fluticasone-Salmeterol Itching and Rash   Iodinated Diagnostic Agents Itching and Rash        Lactose Intolerance (Gi) Other (See Comments)    Gas    Latex Itching, Rash and Other (See Comments)    Pulls off the skin and causes welts   Lisinopril Cough   Metronidazole Other (See Comments)    Reaction not known   Sulfa Antibiotics Rash   Sulfonamide Derivatives Itching and Rash    Past Medical History, Surgical history, Social history, and Family History were reviewed and updated.  Review of Systems: All other 10 point review of systems is negative.   Physical Exam:  vitals were not taken for this visit.   Wt Readings from Last 3 Encounters:  06/12/21 222 lb 1.9 oz (100.8 kg)  06/05/21 221 lb 12.8 oz (100.6 kg)  05/26/21 222 lb 12.8 oz (101.1 kg)    Ocular: Sclerae unicteric, pupils equal, round and reactive to light Ear-nose-throat: Oropharynx clear, dentition  fair Lymphatic: No cervical or supraclavicular adenopathy Lungs no rales or rhonchi, good excursion bilaterally Heart regular rate and rhythm, no murmur appreciated Abd soft, nontender, positive bowel sounds MSK no focal spinal tenderness, no joint edema Neuro: non-focal, well-oriented, appropriate affect Breasts: Deferred   Lab Results  Component Value Date   WBC 5.4 07/16/2021   HGB 10.7 (L) 07/16/2021   HCT 32.7 (L) 07/16/2021   MCV 95.3 07/16/2021   PLT 112 (L) 07/16/2021   Lab Results  Component Value Date  FERRITIN 272 06/12/2021   IRON 58 06/12/2021   TIBC 315 06/12/2021   UIBC 257 06/12/2021   IRONPCTSAT 19 (L) 06/12/2021   Lab Results  Component Value Date   RETICCTPCT 2.3 07/16/2021   RBC 3.43 (L) 07/16/2021   RBC 3.46 (L) 07/16/2021   RETICCTABS 73.3 01/28/2011   No results found for: KPAFRELGTCHN, LAMBDASER, KAPLAMBRATIO No results found for: Kandis Cocking, IGMSERUM No results found for: Odetta Pink, SPEI   Chemistry      Component Value Date/Time   NA 140 06/12/2021 1148   NA 145 09/08/2017 1317   NA 142 02/25/2017 1013   K 4.8 06/12/2021 1148   K 4.5 09/08/2017 1317   K 3.8 02/25/2017 1013   CL 99 06/12/2021 1148   CL 98 09/08/2017 1317   CO2 32 06/12/2021 1148   CO2 32 09/08/2017 1317   CO2 26 02/25/2017 1013   BUN 59 (H) 06/12/2021 1148   BUN 34 (H) 09/08/2017 1317   BUN 20.1 02/25/2017 1013   CREATININE 2.44 (H) 06/12/2021 1148   CREATININE 1.98 (H) 01/17/2021 1601   CREATININE 1.8 (H) 02/25/2017 1013      Component Value Date/Time   CALCIUM 10.0 06/12/2021 1148   CALCIUM 9.3 09/08/2017 1317   CALCIUM 8.7 02/25/2017 1013   ALKPHOS 66 06/12/2021 1148   ALKPHOS 45 09/08/2017 1317   ALKPHOS 65 02/25/2017 1013   AST 15 06/12/2021 1148   AST 16 02/25/2017 1013   ALT 13 06/12/2021 1148   ALT 26 09/08/2017 1317   ALT 13 02/25/2017 1013   BILITOT 0.5 06/12/2021 1148   BILITOT 0.76  02/25/2017 1013       Impression and Plan: Ms. Sek is a very pleasant 86 yo caucasian female with immune based thrombocytopenia.  Nplate given today, platleets 112.  Iron studies are pending. We will replace if needed.  Follow-up in 1 month.  They can contact our office with any questions or concerns.   Lottie Dawson, NP 11/9/20222:40 PM

## 2021-07-17 ENCOUNTER — Telehealth: Payer: Self-pay

## 2021-07-17 ENCOUNTER — Telehealth: Payer: Self-pay | Admitting: *Deleted

## 2021-07-17 ENCOUNTER — Other Ambulatory Visit: Payer: Self-pay

## 2021-07-17 ENCOUNTER — Ambulatory Visit: Payer: Medicare Other | Admitting: Physical Therapy

## 2021-07-17 DIAGNOSIS — G8929 Other chronic pain: Secondary | ICD-10-CM | POA: Diagnosis not present

## 2021-07-17 DIAGNOSIS — R262 Difficulty in walking, not elsewhere classified: Secondary | ICD-10-CM | POA: Diagnosis not present

## 2021-07-17 DIAGNOSIS — M6281 Muscle weakness (generalized): Secondary | ICD-10-CM | POA: Diagnosis not present

## 2021-07-17 DIAGNOSIS — R2689 Other abnormalities of gait and mobility: Secondary | ICD-10-CM

## 2021-07-17 DIAGNOSIS — M25561 Pain in right knee: Secondary | ICD-10-CM | POA: Diagnosis not present

## 2021-07-17 LAB — IRON AND TIBC
Iron: 61 ug/dL (ref 41–142)
Saturation Ratios: 20 % — ABNORMAL LOW (ref 21–57)
TIBC: 296 ug/dL (ref 236–444)
UIBC: 236 ug/dL (ref 120–384)

## 2021-07-17 LAB — FERRITIN: Ferritin: 346 ng/mL — ABNORMAL HIGH (ref 11–307)

## 2021-07-17 NOTE — Therapy (Signed)
[facility] @ [address] [place] [place], [place], [address] Phone: [phone]   Fax:  [phone]  Physical Therapy Treatment  Patient Details  Name: [name] MRN: [id number] Date of Birth: [birth year] Referring Provider (PT): Micheline Rough, MD   Encounter Date: 07/17/2021   PT End of Session - 07/17/21 1530     Visit Number 6    Number of Visits 10    Date for PT Re-Evaluation 08/21/21    Authorization Type [employer] and [employer]BS    PT Start Time 6962    PT Stop Time 1527    PT Time Calculation (min) 42 min    Activity Tolerance Patient tolerated treatment well             Past Medical History:  Diagnosis Date   Allergic rhinitis    Anxiety state, unspecified    panic attacks   CHF (congestive heart failure) ([place])    Depressive disorder, not elsewhere classified    Disorientation, unspecified 08/07/2019   Epistaxis 03/24/2018   Extrinsic asthma, unspecified    no problem since adulthood   History of complications due to general anesthesia 03/03/2021   History of pulmonary embolism 08/07/2019   Hypertensive urgency 01/30/2021   Obesity    OSA on CPAP    severe   Pneumonitis 07/14/2019   Pressure injury of skin 07/10/2019   Pulmonary embolism ([place])    Pure hypercholesterolemia    Respiratory failure with hypoxia ([place]) 09/2008   acute, secondary to multiple bilateral pulmonary embolism , negative hypercoagulable workup 09/2008 hospital stay   Scoliosis    Type II or unspecified type diabetes mellitus without mention of complication, not stated as uncontrolled    Unspecified hypothyroidism    hypo    Past Surgical History:  Procedure Laterality Date   CARDIOVERSION N/A 06/26/2019   Procedure: CARDIOVERSION;  Surgeon: Jerline Pain, MD;  Location: [facility];  Service: Cardiovascular;  Laterality: N/A;   EYE SURGERY Bilateral 2005   cataracts, implants   KIDNEY STONE SURGERY Bilateral    2016, 2017     knee replaced Left 2013   THYROID SURGERY  1966   nodule removal; partial thyroidectomy; radioactive iodine x2; 1990's    There were no vitals filed for this visit.   Subjective Assessment - 07/17/21 1451     Subjective Some arm/neck soreness either from sleeping wrong or from exercise.  "But I see it as a good thing."    Pertinent History CHF  grandson PT at [facility]    Limitations Walking;House hold activities;Lifting;Standing    Patient Stated Goals I want to walk so much; eventually off walker to cane    Currently in Pain? No/denies    Pain Score 0-No pain                               [facility] Adult PT Treatment/Exercise - 07/17/21 0001       Ambulation/Gait   Gait Comments 30 feet pushing wheelchair with 2 L O2      Lumbar Exercises: Seated   Sit to Stand Limitations chair sit ups holding 5# kettlebell 10x      Knee/Hip Exercises: Aerobic   Nustep new model Level 4 10 min      Knee/Hip Exercises: Standing   Heel Raises Both;15 reps    SLS with Vectors in parallel bars 4 inch step taps 7x each  Other Standing Knee Exercises hip flexion 5x right/left   bil UE support   Other Standing Knee Exercises forward and lateral circle touches 5 reps each side      Knee/Hip Exercises: Seated   Long Arc Quad Strengthening;Right;Left;15 reps;Weights    Long Arc Quad Weight 3 lbs.    Other Seated Knee/Hip Exercises 5# kettlebell on thigh hip flexion 10x right/left    Sit to Sand 1 set;5 reps   holding 5# kettlebell     Shoulder Exercises: ROM/Strengthening   Other ROM/Strengthening Exercises seated 2# alternating bicep curls 15x    Other ROM/Strengthening Exercises seated dual cable cross 7# bil 10x                       PT Short Term Goals - 06/26/21 1633       PT SHORT TERM GOAL #1   Title be independent in initial HEP    Time 4    Period Weeks    Status New    Target Date 07/24/21      PT SHORT TERM GOAL #2   Title TUG to be improved  to 21 sec    Baseline 25.94 sec    Time 4    Period Weeks    Status New    Target Date 07/24/21      PT SHORT TERM GOAL #3   Title BERG score to be 41    Baseline 38    Time 4    Period Weeks    Status New    Target Date 07/24/21               PT Long Term Goals - 06/26/21 1635       PT LONG TERM GOAL #1   Title be independent in advanced HEP    Baseline not issued    Time 8    Period Weeks    Status New    Target Date 08/21/21      PT LONG TERM GOAL #2   Title Pt will improve 5 times sit<>stand to <13 sec for decreased fall risk    Baseline 15.81 sec    Time 8    Period Weeks    Status New    Target Date 08/21/21      PT LONG TERM GOAL #3   Title BERG score to be 44    Baseline 38    Time 8    Period Weeks    Status New    Target Date 08/21/21      PT LONG TERM GOAL #4   Title Patient to be able to walk x 3 min with O2 sats greater than 90%    Time 8    Period Weeks    Status New    Target Date 08/21/21      PT LONG TERM GOAL #5   Title .Marland KitchenMarland Kitchen                   Plan - 07/17/21 1531     Clinical Impression Statement The patient is able to participate in seated and standing low level ex's with some resistance.  Short breaks needed particularly between standing ex's secondary to shortness of breath and general fatigue.   O2 saturation remains well above 90% and HR 56 to 62 bpm.  O2 used throughout treatment session.  The patient remains highly motivated and puts forth good effort despite multiple comorbidities which limit speed of progression.  Personal Factors and Comorbidities Comorbidity 1;Comorbidity 2;Comorbidity 3+    Comorbidities obesity, CHF, HTN, diabetes, kidney disease    Examination-Activity Limitations Stand;Locomotion Level;Bathing;Hygiene/Grooming;Stairs;Carry;Dressing    Rehab Potential Good    PT Frequency 2x / week    PT Duration 8 weeks    PT Treatment/Interventions ADLs/Self Care Home Management;Aquatic Therapy;Gait  training;Stair training;Functional mobility training;Neuromuscular re-education;Balance training;Therapeutic exercise;Therapeutic activities;Patient/family education;Dry needling;Manual techniques    PT Next Visit Plan monitor O2 and HR, NuStep; seated ex; limited standing tolerance; gait endurance;  did well with Freemotion Dual Cable cross bil seated             Patient will benefit from skilled therapeutic intervention in order to improve the following deficits and impairments:  Decreased balance, Abnormal gait, Decreased endurance, Difficulty walking, Cardiopulmonary status limiting activity, Decreased activity tolerance, Decreased strength, Impaired perceived functional ability, Obesity  Visit Diagnosis: Difficulty in walking, not elsewhere classified  Muscle weakness (generalized)  Chronic pain of right knee  Other abnormalities of gait and mobility     Problem List Patient Active Problem List   Diagnosis Date Noted   Acute on chronic diastolic (congestive) heart failure ([place]) 03/14/2021   History of anxiety 03/03/2021   History of blood clotting disorder 03/03/2021   History of complications due to general anesthesia 03/03/2021   Acute on chronic diastolic CHF (congestive heart failure) ([place]) 01/30/2021   Lateral epicondylitis of right elbow 08/28/2020   Pain in joint of right elbow 07/15/2020   Calculus of gallbladder without cholecystitis without obstruction 09/28/2019   Infection and inflammatory reaction due to indwelling urethral catheter, subsequent encounter 09/28/2019   Nondisplaced fracture of lateral malleolus of right fibula, subsequent encounter for closed fracture with routine healing 09/28/2019   Hemorrhage of anus and rectum 08/07/2019   Unspecified protein-calorie malnutrition ([place]) 08/07/2019   Difficulty in walking, not elsewhere classified 07/18/2019   Dysphagia, oropharyngeal phase 07/18/2019   Chronic respiratory failure with hypoxia ([place])  07/03/2019   Atypical atrial flutter ([facility])    Paroxysmal atrial fibrillation ([place]) 05/24/2018   Blister (nonthermal), unspecified lower leg, initial encounter 03/24/2018   Cellulitis, unspecified 03/24/2018   Diarrhea, unspecified 03/24/2018   Immune thrombocytopenic purpura ([place]) 03/24/2018   Major depressive disorder, recurrent, mild ([place]) 03/24/2018   Nausea with vomiting, unspecified 03/24/2018   Atherosclerotic heart disease of native coronary artery without angina pectoris 01/13/2018   Candidal vulvovaginitis 01/13/2018   Dental caries, unspecified 01/13/2018   Hypertensive heart and chronic kidney disease with heart failure and stage 1 through stage 4 chronic kidney disease, or unspecified chronic kidney disease ([place]) 01/13/2018   Long term (current) use of insulin ([place]) 01/13/2018   Muscle weakness (generalized) 01/13/2018   Other adverse food reactions, not elsewhere classified, initial encounter 01/13/2018   Other chronic sinusitis 01/13/2018   Postprocedural hypothyroidism 01/13/2018   Unsteadiness on feet 01/13/2018   Urinary tract infection, site not specified 01/13/2018   Normocytic anemia 05/18/2017   Chronic ITP (idiopathic thrombocytopenia) ([place]) 01/25/2017   Hypercalciuria 01/15/2016   Hyperoxaluria 01/15/2016   Chronic kidney disease (CKD) stage G4/A1, severely decreased glomerular filtration rate (GFR) between 15-29 mL/min/1.73 square meter and albuminuria creatinine ratio less than 30 mg/g ([place]) 09/04/2015   Acute on chronic respiratory failure with hypoxia ([place]) 08/24/2015   Decreased urine volume 07/10/2015   Hypocitraturia 07/10/2015   Physical deconditioning 10/11/2014   Latex allergy, contact dermatitis 10/09/2014   Primary osteoarthritis of left knee 09/17/2014   Clostridium difficile carrier 06/07/2014   Constipation, unspecified 06/07/2012  Nutritional deficiency, unspecified 06/07/2012   Tachycardia 06/07/2012   Dyslipidemia 02/12/2012   GERD  (gastroesophageal reflux disease) 02/12/2012   DM type 2 (diabetes mellitus, type 2) ([place]) 08/05/2010   GOUT 06/23/2010   DERMATITIS 01/16/2010   BACK PAIN 06/13/2009   MYALGIA 06/13/2009   Anxiety state 12/13/2008   LEG EDEMA, CHRONIC 11/08/2008   Sleep apnea 10/26/2008   Hypothyroidism 09/27/2008   HYPERCHOLESTEROLEMIA 09/27/2008   OBESITY 09/27/2008   Depression, recurrent ([place]) 09/27/2008   Essential hypertension 09/27/2008   Allergic rhinitis 09/27/2008   ASTHMA, CHILDHOOD 09/27/2008   Ruben Im, PT 07/17/21 4:19 PM Phone: [phone] Fax: [phone]  Alvera Singh, PT 07/17/2021, 4:19 PM  [facility] @ [address] [place], [place], [address] Phone: [phone]   Fax:  [phone]  Name: [name] MRN: [id number] Date of Birth: 01/10/1935

## 2021-07-17 NOTE — Telephone Encounter (Signed)
Called and informed patient of lab results, patient verbalized understanding and denies any questions or concerns at this time. Advised pt we have to await for approval for Monoferric from pharmacy and would call her once we get a decision.

## 2021-07-17 NOTE — Telephone Encounter (Signed)
-----   Message from Volanda Napoleon, MD sent at 07/17/2021  2:03 PM EST ----- Please call and tell her that the iron is on the lower side.  She probably needs a dose of IV iron.  Please set this up.  Thanks.  Laurey Arrow

## 2021-07-17 NOTE — Telephone Encounter (Signed)
Per 07/16/21 los - called and gave upcoming appointments - confirmed 

## 2021-07-19 ENCOUNTER — Other Ambulatory Visit: Payer: Self-pay | Admitting: Family Medicine

## 2021-07-19 DIAGNOSIS — J40 Bronchitis, not specified as acute or chronic: Secondary | ICD-10-CM | POA: Diagnosis not present

## 2021-07-19 DIAGNOSIS — G4733 Obstructive sleep apnea (adult) (pediatric): Secondary | ICD-10-CM | POA: Diagnosis not present

## 2021-07-19 DIAGNOSIS — M6281 Muscle weakness (generalized): Secondary | ICD-10-CM | POA: Diagnosis not present

## 2021-07-19 DIAGNOSIS — J9690 Respiratory failure, unspecified, unspecified whether with hypoxia or hypercapnia: Secondary | ICD-10-CM | POA: Diagnosis not present

## 2021-07-22 ENCOUNTER — Ambulatory Visit: Payer: Medicare Other | Admitting: Physical Therapy

## 2021-07-22 ENCOUNTER — Encounter: Payer: Self-pay | Admitting: Family

## 2021-07-22 ENCOUNTER — Other Ambulatory Visit: Payer: Self-pay

## 2021-07-22 DIAGNOSIS — L603 Nail dystrophy: Secondary | ICD-10-CM | POA: Diagnosis not present

## 2021-07-22 DIAGNOSIS — M6281 Muscle weakness (generalized): Secondary | ICD-10-CM

## 2021-07-22 DIAGNOSIS — L600 Ingrowing nail: Secondary | ICD-10-CM | POA: Diagnosis not present

## 2021-07-22 DIAGNOSIS — R262 Difficulty in walking, not elsewhere classified: Secondary | ICD-10-CM | POA: Diagnosis not present

## 2021-07-22 DIAGNOSIS — B351 Tinea unguium: Secondary | ICD-10-CM | POA: Diagnosis not present

## 2021-07-22 DIAGNOSIS — M25561 Pain in right knee: Secondary | ICD-10-CM | POA: Diagnosis not present

## 2021-07-22 DIAGNOSIS — R2689 Other abnormalities of gait and mobility: Secondary | ICD-10-CM | POA: Diagnosis not present

## 2021-07-22 DIAGNOSIS — G8929 Other chronic pain: Secondary | ICD-10-CM | POA: Diagnosis not present

## 2021-07-22 NOTE — Therapy (Signed)
[facility] @ [address] [place], [place], [address] Phone: [phone]   Fax:  [phone]  Physical Therapy Treatment  Patient Details  Name: [name] MRN: [id number] Date of Birth: 06-19-1935 Referring Provider (PT): Micheline Rough, MD   Encounter Date: 07/22/2021   PT End of Session - 07/22/21 1707     Visit Number 7    Number of Visits 10    Date for PT Re-Evaluation 08/21/21    Authorization Type [employer] and [employer]    PT Start Time 7026    PT Stop Time 1528    PT Time Calculation (min) 40 min    Activity Tolerance Patient tolerated treatment well;Patient limited by fatigue             Past Medical History:  Diagnosis Date   Allergic rhinitis    Anxiety state, unspecified    panic attacks   CHF (congestive heart failure) ([place])    Depressive disorder, not elsewhere classified    Disorientation, unspecified 08/07/2019   Epistaxis 03/24/2018   Extrinsic asthma, unspecified    no problem since adulthood   History of complications due to general anesthesia 03/03/2021   History of pulmonary embolism 08/07/2019   Hypertensive urgency 01/30/2021   Obesity    OSA on CPAP    severe   Pneumonitis 07/14/2019   Pressure injury of skin 07/10/2019   Pulmonary embolism ([place])    Pure hypercholesterolemia    Respiratory failure with hypoxia ([place]) 09/2008   acute, secondary to multiple bilateral pulmonary embolism , negative hypercoagulable workup 09/2008 hospital stay   Scoliosis    Type II or unspecified type diabetes mellitus without mention of complication, not stated as uncontrolled    Unspecified hypothyroidism    hypo    Past Surgical History:  Procedure Laterality Date   CARDIOVERSION N/A 06/26/2019   Procedure: CARDIOVERSION;  Surgeon: Jerline Pain, MD;  Location: [facility];  Service: Cardiovascular;  Laterality: N/A;   EYE SURGERY Bilateral 2005   cataracts, implants   KIDNEY STONE  SURGERY Bilateral    2016, 2017    knee replaced Left 2013   THYROID SURGERY  1966   nodule removal; partial thyroidectomy; radioactive iodine x2; 1990's    There were no vitals filed for this visit.   Subjective Assessment - 07/22/21 1456     Subjective I had an appt in [place] today so I'm tired.   I don't think I can walk much today.   I use heat to help my neck/arm soreness.    Pertinent History CHF  grandson PT at [facility]    Currently in Pain? No/denies    Pain Score 0-No pain                               [facility] Adult PT Treatment/Exercise - 07/22/21 0001       Ambulation/Gait   Gait Comments 30 feet pushing wheelchair with 2 L O2      Lumbar Exercises: Seated   Sit to Stand Limitations chair sit ups holding 5# kettlebell 10x    Other Seated Lumbar Exercises Freemotion Dual Cable 7# bil row 15x    Other Seated Lumbar Exercises yellow plyo ball: 5-8x each hip to hip, shoulder to hip; golf swings, ear to ear      Knee/Hip Exercises: Aerobic   Nustep new model Level 8 10 min  Knee/Hip Exercises: Seated   Long Arc Quad Strengthening;Right;Left;Weights;10 reps    Long Arc Quad Weight 3 lbs.    Clamshell with TheraBand Green   20x   Other Seated Knee/Hip Exercises 2# dumbell resting on thigh hip flexion 5x right/left      Shoulder Exercises: ROM/Strengthening   Other ROM/Strengthening Exercises seated 2# alternating bicep curls 10x    Other ROM/Strengthening Exercises 2# overhead press seated: 2 sets of 5 on each side                       PT Short Term Goals - 06/26/21 1633       PT SHORT TERM GOAL #1   Title be independent in initial HEP    Time 4    Period Weeks    Status New    Target Date 07/24/21      PT SHORT TERM GOAL #2   Title TUG to be improved to 21 sec    Baseline 25.94 sec    Time 4    Period Weeks    Status New    Target Date 07/24/21      PT SHORT TERM GOAL #3   Title BERG score to be 41    Baseline  38    Time 4    Period Weeks    Status New    Target Date 07/24/21               PT Long Term Goals - 06/26/21 1635       PT LONG TERM GOAL #1   Title be independent in advanced HEP    Baseline not issued    Time 8    Period Weeks    Status New    Target Date 08/21/21      PT LONG TERM GOAL #2   Title Pt will improve 5 times sit<>stand to <13 sec for decreased fall risk    Baseline 15.81 sec    Time 8    Period Weeks    Status New    Target Date 08/21/21      PT LONG TERM GOAL #3   Title BERG score to be 44    Baseline 38    Time 8    Period Weeks    Status New    Target Date 08/21/21      PT LONG TERM GOAL #4   Title Patient to be able to walk x 3 min with O2 sats greater than 90%    Time 8    Period Weeks    Status New    Target Date 08/21/21      PT LONG TERM GOAL #5   Title .Marland KitchenMarland Kitchen                   Plan - 07/22/21 1708     Clinical Impression Statement The patient reports general fatigue secondary to a medical appointment in [place].  She is able to perform strengthening in the seated position but declines standing exercise performance secondary to high fatigue level.  On 2L on oxygen, saturation level 93-94%.  HR 57 bpm.  Therapist monitoring response and modifying treatment accordingly.    Comorbidities obesity, CHF, HTN, diabetes, kidney disease    Examination-Activity Limitations Stand;Locomotion Level;Bathing;Hygiene/Grooming;Stairs;Carry;Dressing    Rehab Potential Good    PT Frequency 2x / week    PT Duration 8 weeks    PT Treatment/Interventions ADLs/Self Care Home Management;Aquatic Therapy;Gait training;Stair training;Functional  mobility training;Neuromuscular re-education;Balance training;Therapeutic exercise;Therapeutic activities;Patient/family education;Dry needling;Manual techniques    PT Next Visit Plan next visit do TUG and BERG for STGs;  monitor O2 and HR, NuStep; seated ex; limited standing tolerance; gait endurance;   did well with Freemotion Dual Cable cross bil seated             Patient will benefit from skilled therapeutic intervention in order to improve the following deficits and impairments:  Decreased balance, Abnormal gait, Decreased endurance, Difficulty walking, Cardiopulmonary status limiting activity, Decreased activity tolerance, Decreased strength, Impaired perceived functional ability, Obesity  Visit Diagnosis: Difficulty in walking, not elsewhere classified  Muscle weakness (generalized)  Chronic pain of right knee  Other abnormalities of gait and mobility     Problem List Patient Active Problem List   Diagnosis Date Noted   Acute on chronic diastolic (congestive) heart failure ([place]) 03/14/2021   History of anxiety 03/03/2021   History of blood clotting disorder 03/03/2021   History of complications due to general anesthesia 03/03/2021   Acute on chronic diastolic CHF (congestive heart failure) ([place]) 01/30/2021   Lateral epicondylitis of right elbow 08/28/2020   Pain in joint of right elbow 07/15/2020   Calculus of gallbladder without cholecystitis without obstruction 09/28/2019   Infection and inflammatory reaction due to indwelling urethral catheter, subsequent encounter 09/28/2019   Nondisplaced fracture of lateral malleolus of right fibula, subsequent encounter for closed fracture with routine healing 09/28/2019   Hemorrhage of anus and rectum 08/07/2019   Unspecified protein-calorie malnutrition ([place]) 08/07/2019   Difficulty in walking, not elsewhere classified 07/18/2019   Dysphagia, oropharyngeal phase 07/18/2019   Chronic respiratory failure with hypoxia ([place]) 07/03/2019   Atypical atrial flutter ([facility])    Paroxysmal atrial fibrillation ([place]) 05/24/2018   Blister (nonthermal), unspecified lower leg, initial encounter 03/24/2018   Cellulitis, unspecified 03/24/2018   Diarrhea, unspecified 03/24/2018   Immune thrombocytopenic purpura ([place]) 03/24/2018   Major  depressive disorder, recurrent, mild ([place]) 03/24/2018   Nausea with vomiting, unspecified 03/24/2018   Atherosclerotic heart disease of native coronary artery without angina pectoris 01/13/2018   Candidal vulvovaginitis 01/13/2018   Dental caries, unspecified 01/13/2018   Hypertensive heart and chronic kidney disease with heart failure and stage 1 through stage 4 chronic kidney disease, or unspecified chronic kidney disease ([place]) 01/13/2018   Long term (current) use of insulin ([place]) 01/13/2018   Muscle weakness (generalized) 01/13/2018   Other adverse food reactions, not elsewhere classified, initial encounter 01/13/2018   Other chronic sinusitis 01/13/2018   Postprocedural hypothyroidism 01/13/2018   Unsteadiness on feet 01/13/2018   Urinary tract infection, site not specified 01/13/2018   Normocytic anemia 05/18/2017   Chronic ITP (idiopathic thrombocytopenia) ([place]) 01/25/2017   Hypercalciuria 01/15/2016   Hyperoxaluria 01/15/2016   Chronic kidney disease (CKD) stage G4/A1, severely decreased glomerular filtration rate (GFR) between 15-29 mL/min/1.73 square meter and albuminuria creatinine ratio less than 30 mg/g ([place]) 09/04/2015   Acute on chronic respiratory failure with hypoxia ([place]) 08/24/2015   Decreased urine volume 07/10/2015   Hypocitraturia 07/10/2015   Physical deconditioning 10/11/2014   Latex allergy, contact dermatitis 10/09/2014   Primary osteoarthritis of left knee 09/17/2014   Clostridium difficile carrier 06/07/2014   Constipation, unspecified 06/07/2012   Nutritional deficiency, unspecified 06/07/2012   Tachycardia 06/07/2012   Dyslipidemia 02/12/2012   GERD (gastroesophageal reflux disease) 02/12/2012   DM type 2 (diabetes mellitus, type 2) ([place]) 08/05/2010   GOUT 06/23/2010   DERMATITIS 01/16/2010   BACK PAIN 06/13/2009   MYALGIA 06/13/2009  Anxiety state 12/13/2008   LEG EDEMA, CHRONIC 11/08/2008   Sleep apnea 10/26/2008   Hypothyroidism 09/27/2008    HYPERCHOLESTEROLEMIA 09/27/2008   OBESITY 09/27/2008   Depression, recurrent ([place]) 09/27/2008   Essential hypertension 09/27/2008   Allergic rhinitis 09/27/2008   ASTHMA, CHILDHOOD 09/27/2008   Ruben Im, PT 07/22/21 5:13 PM Phone: [phone] Fax: [phone]  Alvera Singh, PT 07/22/2021, 5:12 PM  [facility] @ [address] [place] [address] [place], [place], [address] Phone: [phone]   Fax:  [phone]  Name: Aaira Oestreicher MRN: [id number] Date of Birth: 09/21/1934

## 2021-07-24 ENCOUNTER — Encounter: Payer: Medicare Other | Admitting: Physical Therapy

## 2021-07-29 ENCOUNTER — Encounter: Payer: Medicare Other | Admitting: Physical Therapy

## 2021-08-04 ENCOUNTER — Ambulatory Visit: Payer: Medicare Other

## 2021-08-05 ENCOUNTER — Encounter: Payer: Medicare Other | Admitting: Physical Therapy

## 2021-08-06 ENCOUNTER — Other Ambulatory Visit: Payer: Self-pay

## 2021-08-06 ENCOUNTER — Encounter: Payer: Self-pay | Admitting: Vascular Surgery

## 2021-08-06 ENCOUNTER — Ambulatory Visit (INDEPENDENT_AMBULATORY_CARE_PROVIDER_SITE_OTHER): Payer: Medicare Other | Admitting: Vascular Surgery

## 2021-08-06 VITALS — BP 193/74 | HR 57 | Temp 97.9°F | Resp 16 | Ht 61.0 in | Wt 222.0 lb

## 2021-08-06 DIAGNOSIS — I872 Venous insufficiency (chronic) (peripheral): Secondary | ICD-10-CM

## 2021-08-06 NOTE — Progress Notes (Signed)
REASON FOR VISIT:   Follow-up of chronic venous insufficiency  MEDICAL ISSUES:   CHRONIC VENOUS INSUFFICIENCY: This patient has CEAP C4B venous disease (hyperpigmentation with lipodermatosclerosis).  This patient has deep venous reflux and also some superficial venous reflux in the proximal left great saphenous vein.  The vein becomes superficial in the proximal to mid thigh.  This is based on my exam with the [facility].  I do not think that addressing the reflux in the proximal saphenous vein would have a big impact on her venous hypertension at this point.  For this reason I would favor conservative measures especially considering her multiple medical comorbidities including home O2, CKD 4, chronic congestive heart failure, type 2 diabetes, and obesity.  We have discussed the importance of intermittent leg elevation and the proper positioning for this.  I have encouraged her to continue to wear her compression stockings.  I have encouraged her to avoid prolonged sitting and standing.  We discussed importance of exercise specifically walking and water aerobics.  In addition we discussed the importance of maintaining a healthy weight as central obesity especially increases lower extremity venous pressure.  Also encouraged her to keep her skin well lubricated especially in the winter.  I will see her back in 1 year with a follow-ups reflux study.  She knows to call sooner if she has problems.   HPI:   [name] is a pleasant 86 y.o. female who was seen by Karoline Caldwell, PA on 05/02/2021 with chronic venous insufficiency.  She had been having swelling in both legs for many years.  This was not associated with significant pain.  She also had hyperpigmentation with some redness in her legs and blistering at times.  She was seen at the wound care center and she had knee-high compression stockings fitted.  She denies any previous history of DVTs but apparently she has had multiple PEs in the  past.  She has a history of atrial fibrillation and was also on home O2.  In addition she has stage IV chronic kidney disease.  She had normal pedal pulses.  She was noted to have some superficial and deep venous reflux.  She was prescribed thigh-high compression stockings with a gradient of 20 to 30 mmHg, encouraged to elevate her legs and was set up for 54-month follow-up visit.  Since she was seen last there have been no significant changes in her history.  Her chief complaint is swelling and discoloration in both legs.  The swelling is not associated with significant pain. She has never had a previous DVT that she is aware of but has had previous PEs.  Most recently 10 years ago.  She has been wearing compression stockings and does try to elevate her legs.  However she cannot lie flat because of her congestive heart failure.   She has multiple medical issues including chronic congestive heart failure, type 2 diabetes, hypertension, obesity, and atrial flutter.  I believe she is not on anticoagulation because of a history of falls in the past.  Past Medical History:  Diagnosis Date   Allergic rhinitis    Anxiety state, unspecified    panic attacks   CHF (congestive heart failure) ([facility])    Depressive disorder, not elsewhere classified    Disorientation, unspecified 08/07/2019   Epistaxis 03/24/2018   Extrinsic asthma, unspecified    no problem since adulthood   History of complications due to general anesthesia 03/03/2021   History of pulmonary embolism 08/07/2019  Hypertensive urgency 01/30/2021   Obesity    OSA on CPAP    severe   Pneumonitis 07/14/2019   Pressure injury of skin 07/10/2019   Pulmonary embolism ([facility])    Pure hypercholesterolemia    Respiratory failure with hypoxia ([place]) 09/2008   acute, secondary to multiple bilateral pulmonary embolism , negative hypercoagulable workup 09/2008 hospital stay   Scoliosis    Type II or unspecified type diabetes mellitus without mention of  complication, not stated as uncontrolled    Unspecified hypothyroidism    hypo    Family History  Problem Relation Age of Onset   Hypertension Father    Hyperlipidemia Father    Allergies Mother    Clotting disorder Mother    Osteoarthritis Mother    Asthma Mother    Hypertension Mother    Stroke Mother    Coronary artery disease Other    Stroke Other    Rheum arthritis Maternal Grandmother    Clotting disorder Maternal Grandmother    Clotting disorder Maternal Uncle    Arthritis Daughter    Diabetes Daughter    High blood pressure Daughter    High Cholesterol Daughter    Depression Maternal Grandfather    Diabetes Paternal Grandmother    Breast cancer Neg Hx     SOCIAL HISTORY: Social History   Tobacco Use   Smoking status: Former    Packs/day: 0.25    Years: 20.00    Pack years: 5.00    Types: Cigarettes    Start date: 45    Quit date: 09/07/1989    Years since quitting: 31.9   Smokeless tobacco: Never  Substance Use Topics   Alcohol use: No    Allergies  Allergen Reactions   Adhesive [Tape] Other (See Comments)    PULLS OFF THE SKIN   Apixaban Other (See Comments)    Internal Bleeding   Aspirin Itching, Rash, Hives and Swelling    Swelling of her tongue   Mirabegron Other (See Comments)    Patient experienced A-Fib   Pineapple Anaphylaxis and Swelling    Throat swells and blisters on tongue and roof of mouth per patient   Metformin Diarrhea and Nausea Only   Tetracycline Hives   Fluticasone-Salmeterol Itching and Rash   Iodinated Diagnostic Agents Itching and Rash        Lactose Intolerance (Gi) Other (See Comments)    Gas    Latex Itching, Rash and Other (See Comments)    Pulls off the skin and causes welts   Lisinopril Cough   Metronidazole Other (See Comments)    Reaction not known   Sulfa Antibiotics Rash   Sulfonamide Derivatives Itching and Rash    Current Outpatient Medications  Medication Sig Dispense Refill   acetaminophen  (TYLENOL) 325 MG tablet Take 650 mg by mouth in the morning and at bedtime.     amLODipine (NORVASC) 2.5 MG tablet Take 3 tablets (7.5 mg total) by mouth daily. 270 tablet 1   atorvastatin (LIPITOR) 40 MG tablet TAKE 1 TABLET(40 MG) BY MOUTH AT BEDTIME 90 tablet 1   BD PEN NEEDLE NANO 2ND GEN 32G X 4 MM MISC USE TO INJECT UP TO FOUR TIMES DAILY AS DIRECTED 200 each 3   bisacodyl (DULCOLAX) 5 MG EC tablet Take 10 mg by mouth every 3 (three) days as needed (for constipation).     Bismuth Tribromoph-Petrolatum (XEROFORM PETROLAT PATCH 4"X4") PADS Apply 1 Units topically daily as needed. 75 each 5   Cholecalciferol (  VITAMIN D3) 2000 units TABS Take 5,000 Units by mouth daily.     Continuous Blood Gluc Sensor (FREESTYLE LIBRE 14 DAY SENSOR) MISC APPLY NEW SENSOR EVERY 14 DAYS AS DIRECTED 6 each 3   Dulaglutide (TRULICITY) 5.28 UX/3.2GM SOPN Inject 0.75 mg into the skin once a week. 2 mL 3   fexofenadine (ALLEGRA) 180 MG tablet Take 180 mg by mouth daily.     hyoscyamine (LEVSIN) 0.125 MG tablet Take 1 tablet by mouth daily.     insulin glargine (LANTUS SOLOSTAR) 100 UNIT/ML Solostar Pen Inject 46 Units into the skin at bedtime.     insulin lispro (HUMALOG KWIKPEN) 100 UNIT/ML KwikPen INJECT 13 TO 20 UNITS UNDER THE SKIN THREE TIMES DAILY WITH BREAKFAST, LUNCH AND EVENING MEAL 30 mL 1   Insulin Syringe-Needle U-100 (INSULIN SYRINGE .3CC/29GX1/2") 29G X 1/2" 0.3 ML MISC Use to inject insulin 4 times a day. 400 each 3   levothyroxine (SYNTHROID) 112 MCG tablet TAKE 1 TABLET(112 MCG) BY MOUTH DAILY 90 tablet 1   Multiple Vitamin (MULTIVITAMIN) tablet Take 1 tablet by mouth daily.     sertraline (ZOLOFT) 50 MG tablet TAKE 1 TABLET BY MOUTH AT BEDTIME 90 tablet 1   torsemide (DEMADEX) 20 MG tablet Take 40 mg by mouth every morning.     vitamin B-12 (CYANOCOBALAMIN) 100 MCG tablet Take 100 mcg by mouth daily.     vitamin C (ASCORBIC ACID) 250 MG tablet Take 250 mg by mouth daily.     amoxicillin (AMOXIL) 500  MG tablet Take 2,000 mg by mouth as needed. Prior to Dental Appt (Patient not taking: Reported on 08/06/2021)     No current facility-administered medications for this visit.    REVIEW OF SYSTEMS:  [X]  denotes positive finding, [ ]  denotes negative finding Cardiac  Comments:  Chest pain or chest pressure:    Shortness of breath upon exertion: x   Short of breath when lying flat:    Irregular heart rhythm: x       Vascular    Pain in calf, thigh, or hip brought on by ambulation:    Pain in feet at night that wakes you up from your sleep:     Blood clot in your veins:    Leg swelling:  x       Pulmonary    Oxygen at home: x   Productive cough:     Wheezing:         Neurologic    Sudden weakness in arms or legs:     Sudden numbness in arms or legs:     Sudden onset of difficulty speaking or slurred speech:    Temporary loss of vision in one eye:     Problems with dizziness:         Gastrointestinal    Blood in stool:     Vomited blood:         Genitourinary    Burning when urinating:     Blood in urine:        Psychiatric    Major depression:         Hematologic    Bleeding problems:    Problems with blood clotting too easily:        Skin    Rashes or ulcers:        Constitutional    Fever or chills:     PHYSICAL EXAM:   Vitals:   08/06/21 1401  BP: (!) 193/74  Pulse: (!) 57  Resp: 16  Temp: 97.9 F (36.6 C)  TempSrc: Temporal  SpO2: 97%  Weight: 222 lb (100.7 kg)  Height: 5\' 1"  (1.549 m)   Body mass index is 41.95 kg/m.  GENERAL: The patient is a well-nourished female, in no acute distress. The vital signs are documented above. CARDIAC: There is a regular rate and rhythm.  VASCULAR: I do not detect carotid bruits. I could not palpate pedal pulses because of her leg swelling. However, she had biphasic dorsalis pedis and posterior tibial signals bilaterally. She has bilateral lower extremity swelling with hyperpigmentation bilaterally.  She  currently has no open ulcers. PULMONARY: There is good air exchange bilaterally without wheezing or rales. ABDOMEN: Soft and non-tender with normal pitched bowel sounds.  MUSCULOSKELETAL: There are no major deformities or cyanosis. NEUROLOGIC: No focal weakness or paresthesias are detected. SKIN: There are no ulcers or rashes noted. PSYCHIATRIC: The patient has a normal affect.  DATA:    VENOUS DUPLEX: I have independently interpreted her venous duplex scan that was done on 05/02/2021.  This was of the left lower extremity only.   There was no evidence of DVT on the left.  The patient had deep venous reflux in the common femoral vein and popliteal vein.  The patient had superficial venous reflux in the left great saphenous vein.  Diameters ranged from 5 to 6 mm.      Deitra Mayo [facility] [phone]

## 2021-08-07 ENCOUNTER — Ambulatory Visit: Payer: Medicare Other | Attending: Family Medicine | Admitting: Physical Therapy

## 2021-08-07 DIAGNOSIS — R262 Difficulty in walking, not elsewhere classified: Secondary | ICD-10-CM | POA: Insufficient documentation

## 2021-08-07 DIAGNOSIS — G8929 Other chronic pain: Secondary | ICD-10-CM | POA: Insufficient documentation

## 2021-08-07 DIAGNOSIS — M6281 Muscle weakness (generalized): Secondary | ICD-10-CM | POA: Insufficient documentation

## 2021-08-07 DIAGNOSIS — M25561 Pain in right knee: Secondary | ICD-10-CM | POA: Diagnosis not present

## 2021-08-07 NOTE — Therapy (Addendum)
[facility] [address] [place], [place], [address] Phone: [phone]   Fax:  [phone]  Physical Therapy Treatment/Discharge Summary   Patient Details  Name: [name] MRN: [id number] Date of Birth: [birth year] Referring Provider (PT): Micheline Rough, MD   Encounter Date: 08/07/2021   PT End of Session - 08/07/21 1448     Visit Number 8    Number of Visits 10    Date for PT Re-Evaluation 08/21/21    Authorization Type [employer] and BCBS    PT Start Time 1400    PT Stop Time 8270    PT Time Calculation (min) 44 min    Activity Tolerance Patient tolerated treatment well;Patient limited by fatigue             Past Medical History:  Diagnosis Date   Allergic rhinitis    Anxiety state, unspecified    panic attacks   CHF (congestive heart failure) ([place])    Depressive disorder, not elsewhere classified    Disorientation, unspecified 08/07/2019   Epistaxis 03/24/2018   Extrinsic asthma, unspecified    no problem since adulthood   History of complications due to general anesthesia 03/03/2021   History of pulmonary embolism 08/07/2019   Hypertensive urgency 01/30/2021   Obesity    OSA on CPAP    severe   Pneumonitis 07/14/2019   Pressure injury of skin 07/10/2019   Pulmonary embolism ([place])    Pure hypercholesterolemia    Respiratory failure with hypoxia ([place]) 09/2008   acute, secondary to multiple bilateral pulmonary embolism , negative hypercoagulable workup 09/2008 hospital stay   Scoliosis    Type II or unspecified type diabetes mellitus without mention of complication, not stated as uncontrolled    Unspecified hypothyroidism    hypo    Past Surgical History:  Procedure Laterality Date   CARDIOVERSION N/A 06/26/2019   Procedure: CARDIOVERSION;  Surgeon: Jerline Pain, MD;  Location: [facility];  Service: Cardiovascular;  Laterality: N/A;   EYE SURGERY Bilateral 2005   cataracts, implants    KIDNEY STONE SURGERY Bilateral    2016, 2017    knee replaced Left 2013   THYROID SURGERY  1966   nodule removal; partial thyroidectomy; radioactive iodine x2; 1990's    There were no vitals filed for this visit.   Subjective Assessment - 08/07/21 1404     Subjective I've been so short of breath I couldn't come.  I went to the vein doctor yesterday and he said the blood flow was good.    Pertinent History CHF  grandson PT at [facility]    Limitations Walking;House hold activities;Lifting;Standing    Patient Stated Goals I want to walk so much; eventually off walker to cane    Currently in Pain? No/denies    Pain Score 0-No pain                               [facility] Adult PT Treatment/Exercise - 08/07/21 0001       Lumbar Exercises: Seated   Other Seated Lumbar Exercises Freemotion Dual Cable 7# bil row 10x2      Knee/Hip Exercises: Aerobic   Nustep new model Level 8 10 min      Knee/Hip Exercises: Seated   Long Arc Quad Strengthening;Right;Left;Weights;10 reps    Long Arc Quad Weight 3 lbs.    Other Seated Knee/Hip Exercises 5# heel raises 10x right/left  Marching Right;Left;5 reps    Sit to [employer] 5 reps   3 reps, rest then 2 more     Shoulder Exercises: ROM/Strengthening   Proximal Shoulder Strengthening, Seated red band forward punches 10x right/left    Other ROM/Strengthening Exercises 5# bicep curls 10x    Other ROM/Strengthening Exercises 2# overhead press seated: 2 sets of 5 on each side                       PT Short Term Goals - 06/26/21 1633       PT SHORT TERM GOAL #1   Title be independent in initial HEP    Time 4    Period Weeks    Status New    Target Date 07/24/21      PT SHORT TERM GOAL #2   Title TUG to be improved to 21 sec    Baseline 25.94 sec    Time 4    Period Weeks    Status New    Target Date 07/24/21      PT SHORT TERM GOAL #3   Title BERG score to be 41    Baseline 38    Time 4    Period Weeks     Status New    Target Date 07/24/21               PT Long Term Goals - 06/26/21 1635       PT LONG TERM GOAL #1   Title be independent in advanced HEP    Baseline not issued    Time 8    Period Weeks    Status New    Target Date 08/21/21      PT LONG TERM GOAL #2   Title Pt will improve 5 times sit<>stand to <13 sec for decreased fall risk    Baseline 15.81 sec    Time 8    Period Weeks    Status New    Target Date 08/21/21      PT LONG TERM GOAL #3   Title BERG score to be 44    Baseline 38    Time 8    Period Weeks    Status New    Target Date 08/21/21      PT LONG TERM GOAL #4   Title Patient to be able to walk x 3 min with O2 sats greater than 90%    Time 8    Period Weeks    Status New    Target Date 08/21/21      PT LONG TERM GOAL #5   Title .Marland KitchenMarland Kitchen                   Plan - 08/07/21 1448     Clinical Impression Statement The patient returns after a 2 week gap secondary to cancellations due to shortness of breath.  Today she is able to participate in low level seated therapeutic exercise for general strengthening.  She does need short rest breaks between each exercise.  O2 saturation 94-95% and HR 55.  Good response to coordinating breathing with ex especially exhaling with exertion.  Therapist monitoring response throughout session and modifying as needed.    Personal Factors and Comorbidities Comorbidity 1;Comorbidity 2;Comorbidity 3+    Comorbidities obesity, CHF, HTN, diabetes, kidney disease    Stability/Clinical Decision Making Evolving/Moderate complexity    Rehab Potential Good    PT Frequency 2x / week    PT  Duration 8 weeks    PT Treatment/Interventions ADLs/Self Care Home Management;Aquatic Therapy;Gait training;Stair training;Functional mobility training;Neuromuscular re-education;Balance training;Therapeutic exercise;Therapeutic activities;Patient/family education;Dry needling;Manual techniques    PT Next Visit Plan monitor O2 and HR,  NuStep; seated ex; limited standing tolerance; gait endurance;  did well with Freemotion Dual Cable cross bil seated             Patient will benefit from skilled therapeutic intervention in order to improve the following deficits and impairments:  Decreased balance, Abnormal gait, Decreased endurance, Difficulty walking, Cardiopulmonary status limiting activity, Decreased activity tolerance, Decreased strength, Impaired perceived functional ability, Obesity  Visit Diagnosis: Difficulty in walking, not elsewhere classified  Chronic pain of right knee  Muscle weakness (generalized)   PHYSICAL THERAPY DISCHARGE SUMMARY  Visits from Start of Care: 8  Current functional level related to goals / functional outcomes: The patient is hospitalized.  Will discharge from PT secondary to a change in status.   Remaining deficits: As above    Education / Equipment: HEP   Patient agrees to discharge. Patient goals were not met. Patient is being discharged due to a change in medical status.   Problem List Patient Active Problem List   Diagnosis Date Noted   Acute on chronic diastolic (congestive) heart failure ([place]) 03/14/2021   History of anxiety 03/03/2021   History of blood clotting disorder 03/03/2021   History of complications due to general anesthesia 03/03/2021   Acute on chronic diastolic CHF (congestive heart failure) ([place]) 01/30/2021   Lateral epicondylitis of right elbow 08/28/2020   Pain in joint of right elbow 07/15/2020   Calculus of gallbladder without cholecystitis without obstruction 09/28/2019   Infection and inflammatory reaction due to indwelling urethral catheter, subsequent encounter 09/28/2019   Nondisplaced fracture of lateral malleolus of right fibula, subsequent encounter for closed fracture with routine healing 09/28/2019   Hemorrhage of anus and rectum 08/07/2019   Unspecified protein-calorie malnutrition ([place]) 08/07/2019   Difficulty in walking, not  elsewhere classified 07/18/2019   Dysphagia, oropharyngeal phase 07/18/2019   Chronic respiratory failure with hypoxia ([place]) 07/03/2019   Atypical atrial flutter ([facility])    Paroxysmal atrial fibrillation ([place]) 05/24/2018   Blister (nonthermal), unspecified lower leg, initial encounter 03/24/2018   Cellulitis, unspecified 03/24/2018   Diarrhea, unspecified 03/24/2018   Immune thrombocytopenic purpura ([place]) 03/24/2018   Major depressive disorder, recurrent, mild ([place]) 03/24/2018   Nausea with vomiting, unspecified 03/24/2018   Atherosclerotic heart disease of native coronary artery without angina pectoris 01/13/2018   Candidal vulvovaginitis 01/13/2018   Dental caries, unspecified 01/13/2018   Hypertensive heart and chronic kidney disease with heart failure and stage 1 through stage 4 chronic kidney disease, or unspecified chronic kidney disease ([place]) 01/13/2018   Long term (current) use of insulin ([place]) 01/13/2018   Muscle weakness (generalized) 01/13/2018   Other adverse food reactions, not elsewhere classified, initial encounter 01/13/2018   Other chronic sinusitis 01/13/2018   Postprocedural hypothyroidism 01/13/2018   Unsteadiness on feet 01/13/2018   Urinary tract infection, site not specified 01/13/2018   Normocytic anemia 05/18/2017   Chronic ITP (idiopathic thrombocytopenia) ([place]) 01/25/2017   Hypercalciuria 01/15/2016   Hyperoxaluria 01/15/2016   Chronic kidney disease (CKD) stage G4/A1, severely decreased glomerular filtration rate (GFR) between 15-29 mL/min/1.73 square meter and albuminuria creatinine ratio less than 30 mg/g ([place]) 09/04/2015   Acute on chronic respiratory failure with hypoxia ([place]) 08/24/2015   Decreased urine volume 07/10/2015   Hypocitraturia 07/10/2015   Physical deconditioning 10/11/2014   Latex allergy, contact  dermatitis 10/09/2014   Primary osteoarthritis of left knee 09/17/2014   Clostridium difficile carrier 06/07/2014   Constipation, unspecified  06/07/2012   Nutritional deficiency, unspecified 06/07/2012   Tachycardia 06/07/2012   Dyslipidemia 02/12/2012   GERD (gastroesophageal reflux disease) 02/12/2012   DM type 2 (diabetes mellitus, type 2) ([place]) 08/05/2010   GOUT 06/23/2010   DERMATITIS 01/16/2010   BACK PAIN 06/13/2009   MYALGIA 06/13/2009   Anxiety state 12/13/2008   LEG EDEMA, CHRONIC 11/08/2008   Sleep apnea 10/26/2008   Hypothyroidism 09/27/2008   HYPERCHOLESTEROLEMIA 09/27/2008   OBESITY 09/27/2008   Depression, recurrent ([place]) 09/27/2008   Essential hypertension 09/27/2008   Allergic rhinitis 09/27/2008   ASTHMA, CHILDHOOD 09/27/2008   Ruben Im, PT 08/07/21 2:55 PM Phone: [phone] Fax: [phone]  Alvera Singh, PT 08/07/2021, 2:55 PM  [facility] [address] [place], [place], [address] Phone: [phone]   Fax:  [phone]  Name: Kourtney Terriquez MRN: [id number] Date of Birth: 1935/02/21

## 2021-08-08 ENCOUNTER — Ambulatory Visit: Payer: Medicare Other

## 2021-08-11 ENCOUNTER — Inpatient Hospital Stay: Payer: Medicare Other

## 2021-08-12 ENCOUNTER — Encounter: Payer: Medicare Other | Admitting: Physical Therapy

## 2021-08-14 ENCOUNTER — Inpatient Hospital Stay: Payer: Medicare Other

## 2021-08-14 ENCOUNTER — Inpatient Hospital Stay: Payer: Medicare Other | Attending: Hematology & Oncology

## 2021-08-14 ENCOUNTER — Telehealth: Payer: Self-pay

## 2021-08-14 ENCOUNTER — Encounter: Payer: Self-pay | Admitting: Hematology & Oncology

## 2021-08-14 ENCOUNTER — Ambulatory Visit: Payer: Medicare Other

## 2021-08-14 ENCOUNTER — Inpatient Hospital Stay (HOSPITAL_BASED_OUTPATIENT_CLINIC_OR_DEPARTMENT_OTHER): Payer: Medicare Other | Admitting: Hematology & Oncology

## 2021-08-14 ENCOUNTER — Other Ambulatory Visit: Payer: Self-pay

## 2021-08-14 ENCOUNTER — Ambulatory Visit: Payer: Medicare Other | Admitting: Physical Therapy

## 2021-08-14 ENCOUNTER — Telehealth: Payer: Self-pay | Admitting: *Deleted

## 2021-08-14 ENCOUNTER — Telehealth: Payer: Self-pay | Admitting: Physical Therapy

## 2021-08-14 VITALS — BP 158/54 | HR 67 | Temp 98.3°F | Resp 16 | Wt 225.0 lb

## 2021-08-14 VITALS — BP 154/53 | HR 70 | Resp 15

## 2021-08-14 DIAGNOSIS — D693 Immune thrombocytopenic purpura: Secondary | ICD-10-CM | POA: Diagnosis not present

## 2021-08-14 DIAGNOSIS — D509 Iron deficiency anemia, unspecified: Secondary | ICD-10-CM | POA: Diagnosis not present

## 2021-08-14 DIAGNOSIS — R300 Dysuria: Secondary | ICD-10-CM

## 2021-08-14 DIAGNOSIS — D500 Iron deficiency anemia secondary to blood loss (chronic): Secondary | ICD-10-CM

## 2021-08-14 LAB — CBC WITH DIFFERENTIAL (CANCER CENTER ONLY)
Abs Immature Granulocytes: 0.11 10*3/uL — ABNORMAL HIGH (ref 0.00–0.07)
Basophils Absolute: 0 10*3/uL (ref 0.0–0.1)
Basophils Relative: 0 %
Eosinophils Absolute: 0.1 10*3/uL (ref 0.0–0.5)
Eosinophils Relative: 2 %
HCT: 31.1 % — ABNORMAL LOW (ref 36.0–46.0)
Hemoglobin: 9.6 g/dL — ABNORMAL LOW (ref 12.0–15.0)
Immature Granulocytes: 2 %
Lymphocytes Relative: 10 %
Lymphs Abs: 0.6 10*3/uL — ABNORMAL LOW (ref 0.7–4.0)
MCH: 30.1 pg (ref 26.0–34.0)
MCHC: 30.9 g/dL (ref 30.0–36.0)
MCV: 97.5 fL (ref 80.0–100.0)
Monocytes Absolute: 0.5 10*3/uL (ref 0.1–1.0)
Monocytes Relative: 8 %
Neutro Abs: 5.1 10*3/uL (ref 1.7–7.7)
Neutrophils Relative %: 78 %
Platelet Count: 107 10*3/uL — ABNORMAL LOW (ref 150–400)
RBC: 3.19 MIL/uL — ABNORMAL LOW (ref 3.87–5.11)
RDW: 15.9 % — ABNORMAL HIGH (ref 11.5–15.5)
WBC Count: 6.5 10*3/uL (ref 4.0–10.5)
nRBC: 0 % (ref 0.0–0.2)

## 2021-08-14 LAB — CMP (CANCER CENTER ONLY)
ALT: 13 U/L (ref 0–44)
AST: 12 U/L — ABNORMAL LOW (ref 15–41)
Albumin: 3.8 g/dL (ref 3.5–5.0)
Alkaline Phosphatase: 69 U/L (ref 38–126)
Anion gap: 7 (ref 5–15)
BUN: 60 mg/dL — ABNORMAL HIGH (ref 8–23)
CO2: 36 mmol/L — ABNORMAL HIGH (ref 22–32)
Calcium: 9.8 mg/dL (ref 8.9–10.3)
Chloride: 102 mmol/L (ref 98–111)
Creatinine: 2.52 mg/dL — ABNORMAL HIGH (ref 0.44–1.00)
GFR, Estimated: 18 mL/min — ABNORMAL LOW (ref >60)
Glucose, Bld: 121 mg/dL — ABNORMAL HIGH (ref 70–99)
Potassium: 5 mmol/L (ref 3.5–5.1)
Sodium: 145 mmol/L (ref 135–145)
Total Bilirubin: 0.6 mg/dL (ref 0.3–1.2)
Total Protein: 6.9 g/dL (ref 6.5–8.1)

## 2021-08-14 LAB — RETICULOCYTES
Immature Retic Fract: 14.3 % (ref 2.3–15.9)
RBC.: 3.13 MIL/uL — ABNORMAL LOW (ref 3.87–5.11)
Retic Count, Absolute: 54.8 10*3/uL (ref 19.0–186.0)
Retic Ct Pct: 1.8 % (ref 0.4–3.1)

## 2021-08-14 MED ORDER — SODIUM CHLORIDE 0.9 % IV SOLN: INTRAVENOUS | Status: DC

## 2021-08-14 MED ORDER — SODIUM CHLORIDE 0.9 % IV SOLN
510.0000 mg | Freq: Once | INTRAVENOUS | Status: AC
  Administered 2021-08-14: 510 mg via INTRAVENOUS
  Filled 2021-08-14: qty 17

## 2021-08-14 MED ORDER — ROMIPLOSTIM 125 MCG ~~LOC~~ SOLR
1.0000 ug/kg | Freq: Once | SUBCUTANEOUS | Status: AC
  Administered 2021-08-14: 100 ug via SUBCUTANEOUS
  Filled 2021-08-14: qty 0.2

## 2021-08-14 NOTE — Progress Notes (Signed)
Hematology and Oncology Follow Up Visit  Jillayne Witte [id number] 04/26/1935 86 y.o. 08/14/2021   Principle Diagnosis:  Immune based thrombocytopenia History of PE Iron deficiency anemia Atrial fib   Current Therapy:        Nplate q 3 week for platelet count < 100K  IV Iron as indicated    Interim History:  Ms. Dobosz is here today for follow-up.  She actually was in for a iron infusion.  She all that well.  Her daughter called and wanted to know she can come in for the iron.  She was having some shortness of breath.  When I saw her, she looked pretty good.  She was having no fever.  I think heart failure.  There is no bleeding.  She has had no issues with nausea or vomiting.  She had a good Thanksgiving with her family.  Her iron studies that were done a month ago showed a ferritin of 346 with an iron saturation of 20%.  Today, her labs show white cell count 6.5.  Hemoglobin 9.6.  Platelet count 107,000.  I would have to say that overall, her performance status is probably ECOG 3.    Medications:  Allergies as of 08/14/2021       Reactions   Adhesive [tape] Other (See Comments)   PULLS OFF THE SKIN   Apixaban Other (See Comments)   Internal Bleeding   Aspirin Itching, Rash, Hives, Swelling   Swelling of her tongue   Mirabegron Other (See Comments)   Patient experienced A-Fib   Pineapple Anaphylaxis, Swelling   Throat swells and blisters on tongue and roof of mouth per patient   Metformin Diarrhea, Nausea Only   Tetracycline Hives   Fluticasone-salmeterol Itching, Rash   Iodinated Diagnostic Agents Itching, Rash      Lactose Intolerance (gi) Other (See Comments)   Gas   Latex Itching, Rash, Other (See Comments)   Pulls off the skin and causes welts   Lisinopril Cough   Metronidazole Other (See Comments)   Reaction not known   Sulfa Antibiotics Rash   Sulfonamide Derivatives Itching, Rash        Medication List        Accurate as of August 14, 2021  5:45 PM. If you have any questions, ask your nurse or doctor.          acetaminophen 325 MG tablet Commonly known as: TYLENOL Take 650 mg by mouth in the morning and at bedtime.   amLODipine 2.5 MG tablet Commonly known as: NORVASC Take 3 tablets (7.5 mg total) by mouth daily.   amoxicillin 500 MG tablet Commonly known as: AMOXIL Take 2,000 mg by mouth as needed. Prior to Dental Appt   atorvastatin 40 MG tablet Commonly known as: LIPITOR TAKE 1 TABLET(40 MG) BY MOUTH AT BEDTIME   BD Pen Needle Nano 2nd Gen 32G X 4 MM Misc Generic drug: Insulin Pen Needle USE TO INJECT UP TO FOUR TIMES DAILY AS DIRECTED   bisacodyl 5 MG EC tablet Commonly known as: DULCOLAX Take 10 mg by mouth every 3 (three) days as needed (for constipation).   fexofenadine 180 MG tablet Commonly known as: ALLEGRA Take 180 mg by mouth daily.   FreeStyle Libre 14 Day Sensor Misc APPLY NEW SENSOR EVERY 14 DAYS AS DIRECTED   hyoscyamine 0.125 MG tablet Commonly known as: LEVSIN Take 1 tablet by mouth daily.   insulin lispro 100 UNIT/ML KwikPen Commonly known as: HumaLOG KwikPen INJECT 13 TO  20 UNITS UNDER THE SKIN THREE TIMES DAILY WITH BREAKFAST, LUNCH AND EVENING MEAL   INSULIN SYRINGE .3CC/29GX1/2" 29G X 1/2" 0.3 ML Misc Use to inject insulin 4 times a day.   Lantus SoloStar 100 UNIT/ML Solostar Pen Generic drug: insulin glargine Inject 46 Units into the skin at bedtime.   levothyroxine 112 MCG tablet Commonly known as: SYNTHROID TAKE 1 TABLET(112 MCG) BY MOUTH DAILY   multivitamin tablet Take 1 tablet by mouth daily.   sertraline 50 MG tablet Commonly known as: ZOLOFT TAKE 1 TABLET BY MOUTH AT BEDTIME   torsemide 20 MG tablet Commonly known as: DEMADEX Take 40 mg by mouth every morning.   Trulicity 3.41 DQ/2.2WL Sopn Generic drug: Dulaglutide Inject 0.75 mg into the skin once a week.   vitamin B-12 100 MCG tablet Commonly known as: CYANOCOBALAMIN Take 100 mcg by mouth  daily.   vitamin C 250 MG tablet Commonly known as: ASCORBIC ACID Take 250 mg by mouth daily.   Vitamin D3 50 MCG (2000 UT) Tabs Take 5,000 Units by mouth daily.   Xeroform Petrolat Patch 4"x4" Pads Apply 1 Units topically daily as needed.        Allergies:  Allergies  Allergen Reactions   Adhesive [Tape] Other (See Comments)    PULLS OFF THE SKIN   Apixaban Other (See Comments)    Internal Bleeding   Aspirin Itching, Rash, Hives and Swelling    Swelling of her tongue   Mirabegron Other (See Comments)    Patient experienced A-Fib   Pineapple Anaphylaxis and Swelling    Throat swells and blisters on tongue and roof of mouth per patient   Metformin Diarrhea and Nausea Only   Tetracycline Hives   Fluticasone-Salmeterol Itching and Rash   Iodinated Diagnostic Agents Itching and Rash        Lactose Intolerance (Gi) Other (See Comments)    Gas    Latex Itching, Rash and Other (See Comments)    Pulls off the skin and causes welts   Lisinopril Cough   Metronidazole Other (See Comments)    Reaction not known   Sulfa Antibiotics Rash   Sulfonamide Derivatives Itching and Rash    Past Medical History, Surgical history, Social history, and Family History were reviewed and updated.  Review of Systems: All other 10 point review of systems is negative.   Physical Exam:  weight is 225 lb (102.1 kg). Her oral temperature is 98.3 F (36.8 C). Her blood pressure is 158/54 (abnormal) and her pulse is 67. Her respiration is 16 and oxygen saturation is 100%.   Wt Readings from Last 3 Encounters:  08/14/21 225 lb (102.1 kg)  08/06/21 222 lb (100.7 kg)  07/16/21 222 lb 12.8 oz (101.1 kg)    Ocular: Sclerae unicteric, pupils equal, round and reactive to light Ear-nose-throat: Oropharynx clear, dentition fair Lymphatic: No cervical or supraclavicular adenopathy Lungs no rales or rhonchi, good excursion bilaterally Heart regular rate and rhythm, no murmur appreciated Abd soft,  nontender, positive bowel sounds MSK no focal spinal tenderness, no joint edema Neuro: non-focal, well-oriented, appropriate affect Breasts: Deferred   Lab Results  Component Value Date   WBC 6.5 08/14/2021   HGB 9.6 (L) 08/14/2021   HCT 31.1 (L) 08/14/2021   MCV 97.5 08/14/2021   PLT 107 (L) 08/14/2021   Lab Results  Component Value Date   FERRITIN 346 (H) 07/16/2021   IRON 61 07/16/2021   TIBC 296 07/16/2021   UIBC 236 07/16/2021   IRONPCTSAT  20 (L) 07/16/2021   Lab Results  Component Value Date   RETICCTPCT 1.8 08/14/2021   RBC 3.13 (L) 08/14/2021   RETICCTABS 73.3 01/28/2011   No results found for: KPAFRELGTCHN, LAMBDASER, KAPLAMBRATIO No results found for: Kandis Cocking, IGMSERUM No results found for: Odetta Pink, SPEI   Chemistry      Component Value Date/Time   NA 145 08/14/2021 1317   NA 145 09/08/2017 1317   NA 142 02/25/2017 1013   K 5.0 08/14/2021 1317   K 4.5 09/08/2017 1317   K 3.8 02/25/2017 1013   CL 102 08/14/2021 1317   CL 98 09/08/2017 1317   CO2 36 (H) 08/14/2021 1317   CO2 32 09/08/2017 1317   CO2 26 02/25/2017 1013   BUN 60 (H) 08/14/2021 1317   BUN 34 (H) 09/08/2017 1317   BUN 20.1 02/25/2017 1013   CREATININE 2.52 (H) 08/14/2021 1317   CREATININE 1.98 (H) 01/17/2021 1601   CREATININE 1.8 (H) 02/25/2017 1013      Component Value Date/Time   CALCIUM 9.8 08/14/2021 1317   CALCIUM 9.3 09/08/2017 1317   CALCIUM 8.7 02/25/2017 1013   ALKPHOS 69 08/14/2021 1317   ALKPHOS 45 09/08/2017 1317   ALKPHOS 65 02/25/2017 1013   AST 12 (L) 08/14/2021 1317   AST 16 02/25/2017 1013   ALT 13 08/14/2021 1317   ALT 26 09/08/2017 1317   ALT 13 02/25/2017 1013   BILITOT 0.6 08/14/2021 1317   BILITOT 0.76 02/25/2017 1013       Impression and Plan: Ms. Milburn is a very pleasant 86 yo caucasian female with immune based thrombocytopenia.  This is doing okay.  She does not need Nplate.  I am happy  about that.  She will get her iron today.  I think she is okay for iron.  I will see any problems with her breathing.  She is on oxygen chronically.  Oxygen saturation is 100%.  She is not tachycardic.  I do not think we need any x-rays on her.  Her hemoglobin is down.  The iron should hopefully help with this.  We will plan to get her back to see [place] after the Christmas holiday.  Volanda Napoleon, MD 12/8/20225:45 PM

## 2021-08-14 NOTE — Telephone Encounter (Signed)
Call received from patient's daughter requesting for pt to come in today for the iron infusion instead of tomorrow.  Pt.'s daughter states that pt will keep scheduled appts for tomorrow with Judson Roch NP.

## 2021-08-14 NOTE — Progress Notes (Signed)
Patient declined to stay for the post infusion observation period. Patient denies any difficulty with this infusion in the past and is aware to call with any questions or concerns.   Pt verbalized understanding and had no further questions today.   

## 2021-08-14 NOTE — Telephone Encounter (Signed)
Left message regarding missed appt.

## 2021-08-14 NOTE — Telephone Encounter (Signed)
caller states her mother has some breathing issues and anxiety issues. Feels fine. Wants to know if she can increase medication. having issues holding bladder as well. wants to also schedule an appointment. --Caller states her mother has some breathing issues and anxiety issues. Daughter states her O2SAT is 98% and mother still feels anxious like she is having trouble breathing. Patient wants to know if she can increase anxiety medication (Zoloft). She is having issues holding bladder as well and has stopped taking her diuretic.  08/13/2021 4:22:35 PM Go to ED Now (or PCP triage) D'Heur Lucia Gaskins, RN, [place]  Comments User: Vincente Liberty, D'Heur Lucia Gaskins, RN Date/Time Eilene Ghazi Time): 08/13/2021 4:05:51 PM Patient has appointment with pulmonologist day after tomorrow. Daughter also states her mother sees a hematologist because she doesn't make platelets. She will see him on Friday as well. Daughter states mother's breathing is good now & attributes it to her taking Zoloft.  User: Vincente Liberty, D'Heur Lucia Gaskins, RN Date/Time Eilene Ghazi Time): 08/13/2021 4:06:41 PM Correction: daughter states she is NOT seeing the pulmonologist on Friday.  User: Vincente Liberty, D'Heur Lucia Gaskins, RN Date/Time Eilene Ghazi Time): 08/13/2021 4:07:43 PM Patient also has a-fib.  User: Vincente Liberty, D'Heur Lucia Gaskins, RN Date/Time Eilene Ghazi Time): 08/13/2021 4:09:48 PM Current O2SAT is 92% with O@, pulse is 55.  User: Vincente Liberty, D'Heur Lucia Gaskins, RN Date/Time Eilene Ghazi Time): 08/13/2021 4:11:02 PM Current BP is 140/60.  Referrals GO TO FACILITY OTHER - SPECIFY  08/14/21 at 1030:Pt well sounding on phone. States she did not go to ED as advised by triager yesterday. Pt states she was calling to see if she could increase her Zoloft & discuss urinary incontinence.  Last OV 05/09/21 - takes torsemide 40mg . States it was helpful yesterday to take 20mg  in day & 20mg  in evening, so she will continue to take that way. Will consult PCP regarding: possible OV visit &  question about increasing Zoloft (currently 50mg )

## 2021-08-14 NOTE — Patient Instructions (Signed)

## 2021-08-15 ENCOUNTER — Ambulatory Visit: Payer: Medicare Other

## 2021-08-15 ENCOUNTER — Other Ambulatory Visit: Payer: Medicare Other

## 2021-08-15 ENCOUNTER — Ambulatory Visit: Payer: Medicare Other | Admitting: Family

## 2021-08-15 LAB — IRON AND TIBC
Iron: 41 ug/dL (ref 41–142)
Saturation Ratios: 16 % — ABNORMAL LOW (ref 21–57)
TIBC: 258 ug/dL (ref 236–444)
UIBC: 216 ug/dL (ref 120–384)

## 2021-08-15 LAB — FERRITIN: Ferritin: 310 ng/mL — ABNORMAL HIGH (ref 11–307)

## 2021-08-15 NOTE — Telephone Encounter (Signed)
I spoke with the pt and informed her of the message below. Pt verbalized understanding. Pt reported she is currently not having any chest pain/pressure, tightness or SOB. Pt stated that she is currently using o2 and this has helped with SOB. I informed the pt that if she has any chest pain/pressure or SOB to seek emergency care at ED or UC. Pt verbalized understanding. OV has been scheduled on 08/20/2021 for follow up.

## 2021-08-15 NOTE — Telephone Encounter (Signed)
Ok to drop off ua/culture to test urine and make sure not infection.   Prefer evaluation if feeling short of breath. Make sure no other sx like chest pain/pressure. Would be ok to double up on zoloft to see if this is helpful if she is feeling more anxious.   Please see if we can set up in office eval for her? If sx are stable; then ok to wait until next week and ok to use same day.

## 2021-08-15 NOTE — Addendum Note (Signed)
Addended by: Nilda Riggs on: 08/15/2021 02:21 PM   Modules accepted: Orders

## 2021-08-16 ENCOUNTER — Other Ambulatory Visit: Payer: Self-pay | Admitting: Internal Medicine

## 2021-08-16 DIAGNOSIS — Z794 Long term (current) use of insulin: Secondary | ICD-10-CM

## 2021-08-16 DIAGNOSIS — E1122 Type 2 diabetes mellitus with diabetic chronic kidney disease: Secondary | ICD-10-CM

## 2021-08-18 ENCOUNTER — Encounter: Payer: Self-pay | Admitting: Pulmonary Disease

## 2021-08-18 ENCOUNTER — Telehealth: Payer: Self-pay | Admitting: Pulmonary Disease

## 2021-08-18 ENCOUNTER — Ambulatory Visit (INDEPENDENT_AMBULATORY_CARE_PROVIDER_SITE_OTHER): Payer: Medicare Other

## 2021-08-18 ENCOUNTER — Ambulatory Visit (INDEPENDENT_AMBULATORY_CARE_PROVIDER_SITE_OTHER): Payer: Medicare Other | Admitting: Pulmonary Disease

## 2021-08-18 ENCOUNTER — Other Ambulatory Visit: Payer: Self-pay

## 2021-08-18 DIAGNOSIS — M6281 Muscle weakness (generalized): Secondary | ICD-10-CM | POA: Diagnosis not present

## 2021-08-18 DIAGNOSIS — J9611 Chronic respiratory failure with hypoxia: Secondary | ICD-10-CM

## 2021-08-18 DIAGNOSIS — G4733 Obstructive sleep apnea (adult) (pediatric): Secondary | ICD-10-CM | POA: Diagnosis not present

## 2021-08-18 DIAGNOSIS — R0602 Shortness of breath: Secondary | ICD-10-CM | POA: Diagnosis not present

## 2021-08-18 DIAGNOSIS — I130 Hypertensive heart and chronic kidney disease with heart failure and stage 1 through stage 4 chronic kidney disease, or unspecified chronic kidney disease: Secondary | ICD-10-CM | POA: Diagnosis not present

## 2021-08-18 DIAGNOSIS — I517 Cardiomegaly: Secondary | ICD-10-CM | POA: Diagnosis not present

## 2021-08-18 DIAGNOSIS — J40 Bronchitis, not specified as acute or chronic: Secondary | ICD-10-CM | POA: Diagnosis not present

## 2021-08-18 DIAGNOSIS — J90 Pleural effusion, not elsewhere classified: Secondary | ICD-10-CM | POA: Diagnosis not present

## 2021-08-18 DIAGNOSIS — J9690 Respiratory failure, unspecified, unspecified whether with hypoxia or hypercapnia: Secondary | ICD-10-CM | POA: Diagnosis not present

## 2021-08-18 NOTE — Patient Instructions (Addendum)
  X Increase demadex to 1.5 tabs twice daily x 3 days ,   X Weigh yourself daily  -goal weight less than 220 lbs  X Get back on CPAP   X CXR today

## 2021-08-18 NOTE — Telephone Encounter (Signed)
Call made to patient, confirmed DOB. Made aware of results per RA:    Voiced understanding. Aware to call with an update in 5 days.   Nothing further needed at this time.

## 2021-08-18 NOTE — Assessment & Plan Note (Signed)
She is now needing oxygen 24/7. Will ask DME to assess for portable concentrator.  We will assess chest x-ray to see why breathing is worse but in the past this has been a fluid issue

## 2021-08-18 NOTE — Progress Notes (Signed)
Subjective:    Patient ID: [name], female    DOB: 1935-07-19, 86 y.o.   MRN: [id number]  HPI   86 yo ex-smoker for FU of hypoxia and severe obstructive sleep apnea on nocturnal CPAP   Critical illness with pneumonitis, acute hypoxic respiratory failure, prolonged hospitalization 07/2019, was DNR, improved with BiPAP and steroids, required prolonged rehab    PMH -  chronic kidney disease, ITP, A. fib and history of PE in 2010     03/2021  admitted for acute exacerbation of chronic diastolic CHF and acute hypoxic respiratory failure.  She was treated with IV Lasix 80 every 12 her condition improved.  Unfortunately, she could not be weaned off of oxygen.  Oxygen saturation on room air at rest was 94%, oxygen saturation on room air while ambulating was 84% on oxygen saturation on 2 L of oxygen while ambulating was 90%.  2 L/min oxygen was prescribed  Arrives in a wheelchair accompanied by her daughter and aide.  Daughter reports increased shortness of breath for the past 2 weeks, she has been on oxygen 24/7.  Saturation was 90% on 3 L on arrival. Chest x-ray 04/2021 shows right effusion, hospital discharge weight was 224 pounds when she is currently at that weight.  Labs from 08/14/2021 were reviewed -BUN/creatinine 60/2.5, hemoglobin has dropped to 9.6 from 10.7, platelets 110 K  She reports compliance with Demadex 20 mg that she takes twice daily, follows with renal Has still not obtained her CPAP machine and is hesitant to do so, DME told her that it would cost her $1600 to obtain a new machine since her old machine is still within the 5-year range       Significant tests/ events reviewed HST 02/2021 showed mod OSA with AHI 15/ hr   CPAP titration 8 cm   NPSG in 04/2008 showed severe obstructive sleep apnea with AHI 85/h, TST 121 mins - no REM sleep. Subsequent titration study showed CPAP requirement of 15 cm with small comfort gel mask   07/2019 ABG 7.4 5/61/54  01/2021  VQ scan negative  Past Medical History:  Diagnosis Date   Allergic rhinitis    Anxiety state, unspecified    panic attacks   CHF (congestive heart failure) (HCC)    Depressive disorder, not elsewhere classified    Disorientation, unspecified 08/07/2019   Epistaxis 03/24/2018   Extrinsic asthma, unspecified    no problem since adulthood   History of complications due to general anesthesia 03/03/2021   History of pulmonary embolism 08/07/2019   Hypertensive urgency 01/30/2021   Obesity    OSA on CPAP    severe   Pneumonitis 07/14/2019   Pressure injury of skin 07/10/2019   Pulmonary embolism ([place])    Pure hypercholesterolemia    Respiratory failure with hypoxia ([place]) 09/2008   acute, secondary to multiple bilateral pulmonary embolism , negative hypercoagulable workup 09/2008 hospital stay   Scoliosis    Type II or unspecified type diabetes mellitus without mention of complication, not stated as uncontrolled    Unspecified hypothyroidism    hypo      Review of Systems neg for any significant sore throat, dysphagia, itching, sneezing, nasal congestion or excess/ purulent secretions, fever, chills, sweats, unintended wt loss, pleuritic or exertional cp, hempoptysis, orthopnea pnd or change in chronic leg swelling. Also denies presyncope, palpitations, heartburn, abdominal pain, nausea, vomiting, diarrhea or change in bowel or urinary habits, dysuria,hematuria, rash, arthralgias, visual complaints, headache, numbness weakness or ataxia.  Objective:   Physical Exam  Gen. Pleasant, obese, in no distress ENT - no lesions, no post nasal drip Neck: No JVD, no thyromegaly, no carotid bruits Lungs: no use of accessory muscles, no dullness to percussion, decreased without rales or rhonchi  Cardiovascular: Rhythm regular, heart sounds  normal, no murmurs or gallops, 1+ peripheral edema Musculoskeletal: No deformities, no cyanosis or clubbing , no tremors       Assessment & Plan:

## 2021-08-18 NOTE — Assessment & Plan Note (Addendum)
Her weight is at 225 pounds Chest x-ray independently reviewed which shows cardiomegaly, small bilateral effusions and bilateral hazy opacities suggestive of pulmonary edema  I have asked her to increase Demadex for 3 days to 60 mg daily in divided doses of 1.5 tablets twice daily, to achieve a weight loss of at least 5 pounds and see if her hypoxia improves

## 2021-08-18 NOTE — Telephone Encounter (Signed)
FYI for call report sent to provider for review.

## 2021-08-19 ENCOUNTER — Encounter: Payer: Medicare Other | Admitting: Physical Therapy

## 2021-08-19 NOTE — Progress Notes (Signed)
Spoke with the pt's daughter and notified of results/recs per Dr Elsworth Soho. She verbalized understanding.

## 2021-08-20 ENCOUNTER — Other Ambulatory Visit: Payer: Self-pay

## 2021-08-20 ENCOUNTER — Ambulatory Visit (INDEPENDENT_AMBULATORY_CARE_PROVIDER_SITE_OTHER): Payer: Medicare Other

## 2021-08-20 ENCOUNTER — Inpatient Hospital Stay (HOSPITAL_COMMUNITY)
Admission: EM | Admit: 2021-08-20 | Discharge: 2021-08-27 | DRG: 291 | Disposition: A | Discharge status: Hospice | Payer: Medicare Other | Attending: Family Medicine | Admitting: Family Medicine

## 2021-08-20 ENCOUNTER — Ambulatory Visit (INDEPENDENT_AMBULATORY_CARE_PROVIDER_SITE_OTHER): Payer: Medicare Other | Admitting: Family Medicine

## 2021-08-20 ENCOUNTER — Emergency Department (HOSPITAL_COMMUNITY): Payer: Medicare Other

## 2021-08-20 ENCOUNTER — Encounter (HOSPITAL_COMMUNITY): Payer: Self-pay | Admitting: Emergency Medicine

## 2021-08-20 ENCOUNTER — Encounter: Payer: Self-pay | Admitting: Family Medicine

## 2021-08-20 ENCOUNTER — Telehealth: Payer: Self-pay | Admitting: *Deleted

## 2021-08-20 VITALS — BP 128/58 | HR 57 | Temp 97.7°F | Ht 61.0 in

## 2021-08-20 DIAGNOSIS — R0902 Hypoxemia: Secondary | ICD-10-CM | POA: Diagnosis not present

## 2021-08-20 DIAGNOSIS — J9622 Acute and chronic respiratory failure with hypercapnia: Secondary | ICD-10-CM

## 2021-08-20 DIAGNOSIS — E039 Hypothyroidism, unspecified: Secondary | ICD-10-CM

## 2021-08-20 DIAGNOSIS — J9602 Acute respiratory failure with hypercapnia: Secondary | ICD-10-CM | POA: Diagnosis not present

## 2021-08-20 DIAGNOSIS — G4733 Obstructive sleep apnea (adult) (pediatric): Secondary | ICD-10-CM

## 2021-08-20 DIAGNOSIS — E119 Type 2 diabetes mellitus without complications: Secondary | ICD-10-CM

## 2021-08-20 DIAGNOSIS — Z8249 Family history of ischemic heart disease and other diseases of the circulatory system: Secondary | ICD-10-CM

## 2021-08-20 DIAGNOSIS — R778 Other specified abnormalities of plasma proteins: Secondary | ICD-10-CM | POA: Diagnosis present

## 2021-08-20 DIAGNOSIS — N179 Acute kidney failure, unspecified: Secondary | ICD-10-CM | POA: Diagnosis not present

## 2021-08-20 DIAGNOSIS — I480 Paroxysmal atrial fibrillation: Secondary | ICD-10-CM | POA: Diagnosis not present

## 2021-08-20 DIAGNOSIS — Z91199 Patient's noncompliance with other medical treatment and regimen due to unspecified reason: Secondary | ICD-10-CM

## 2021-08-20 DIAGNOSIS — Z79899 Other long term (current) drug therapy: Secondary | ICD-10-CM

## 2021-08-20 DIAGNOSIS — I5033 Acute on chronic diastolic (congestive) heart failure: Secondary | ICD-10-CM | POA: Diagnosis not present

## 2021-08-20 DIAGNOSIS — Z86711 Personal history of pulmonary embolism: Secondary | ICD-10-CM

## 2021-08-20 DIAGNOSIS — N390 Urinary tract infection, site not specified: Secondary | ICD-10-CM | POA: Diagnosis present

## 2021-08-20 DIAGNOSIS — R6889 Other general symptoms and signs: Secondary | ICD-10-CM | POA: Diagnosis not present

## 2021-08-20 DIAGNOSIS — Z832 Family history of diseases of the blood and blood-forming organs and certain disorders involving the immune mechanism: Secondary | ICD-10-CM

## 2021-08-20 DIAGNOSIS — R350 Frequency of micturition: Secondary | ICD-10-CM

## 2021-08-20 DIAGNOSIS — J9690 Respiratory failure, unspecified, unspecified whether with hypoxia or hypercapnia: Secondary | ICD-10-CM | POA: Diagnosis not present

## 2021-08-20 DIAGNOSIS — Z6841 Body Mass Index (BMI) 40.0 and over, adult: Secondary | ICD-10-CM | POA: Diagnosis not present

## 2021-08-20 DIAGNOSIS — L308 Other specified dermatitis: Secondary | ICD-10-CM | POA: Diagnosis present

## 2021-08-20 DIAGNOSIS — K8020 Calculus of gallbladder without cholecystitis without obstruction: Secondary | ICD-10-CM | POA: Diagnosis not present

## 2021-08-20 DIAGNOSIS — N189 Chronic kidney disease, unspecified: Secondary | ICD-10-CM

## 2021-08-20 DIAGNOSIS — D693 Immune thrombocytopenic purpura: Secondary | ICD-10-CM | POA: Diagnosis not present

## 2021-08-20 DIAGNOSIS — R509 Fever, unspecified: Secondary | ICD-10-CM | POA: Diagnosis not present

## 2021-08-20 DIAGNOSIS — J90 Pleural effusion, not elsewhere classified: Secondary | ICD-10-CM | POA: Diagnosis not present

## 2021-08-20 DIAGNOSIS — R5381 Other malaise: Secondary | ICD-10-CM | POA: Diagnosis present

## 2021-08-20 DIAGNOSIS — R404 Transient alteration of awareness: Secondary | ICD-10-CM | POA: Diagnosis not present

## 2021-08-20 DIAGNOSIS — R5383 Other fatigue: Secondary | ICD-10-CM

## 2021-08-20 DIAGNOSIS — Z515 Encounter for palliative care: Secondary | ICD-10-CM | POA: Diagnosis not present

## 2021-08-20 DIAGNOSIS — I517 Cardiomegaly: Secondary | ICD-10-CM | POA: Diagnosis not present

## 2021-08-20 DIAGNOSIS — L8990 Pressure ulcer of unspecified site, unspecified stage: Secondary | ICD-10-CM | POA: Diagnosis present

## 2021-08-20 DIAGNOSIS — Z66 Do not resuscitate: Secondary | ICD-10-CM | POA: Diagnosis present

## 2021-08-20 DIAGNOSIS — R001 Bradycardia, unspecified: Secondary | ICD-10-CM | POA: Diagnosis present

## 2021-08-20 DIAGNOSIS — J9601 Acute respiratory failure with hypoxia: Secondary | ICD-10-CM | POA: Diagnosis not present

## 2021-08-20 DIAGNOSIS — I272 Pulmonary hypertension, unspecified: Secondary | ICD-10-CM | POA: Diagnosis present

## 2021-08-20 DIAGNOSIS — R0602 Shortness of breath: Secondary | ICD-10-CM

## 2021-08-20 DIAGNOSIS — Z91048 Other nonmedicinal substance allergy status: Secondary | ICD-10-CM

## 2021-08-20 DIAGNOSIS — Z833 Family history of diabetes mellitus: Secondary | ICD-10-CM

## 2021-08-20 DIAGNOSIS — F419 Anxiety disorder, unspecified: Secondary | ICD-10-CM | POA: Diagnosis present

## 2021-08-20 DIAGNOSIS — B961 Klebsiella pneumoniae [K. pneumoniae] as the cause of diseases classified elsewhere: Secondary | ICD-10-CM | POA: Diagnosis present

## 2021-08-20 DIAGNOSIS — J810 Acute pulmonary edema: Secondary | ICD-10-CM

## 2021-08-20 DIAGNOSIS — F32A Depression, unspecified: Secondary | ICD-10-CM | POA: Diagnosis not present

## 2021-08-20 DIAGNOSIS — R0689 Other abnormalities of breathing: Secondary | ICD-10-CM | POA: Diagnosis not present

## 2021-08-20 DIAGNOSIS — Z7189 Other specified counseling: Secondary | ICD-10-CM | POA: Diagnosis not present

## 2021-08-20 DIAGNOSIS — Z794 Long term (current) use of insulin: Secondary | ICD-10-CM | POA: Diagnosis not present

## 2021-08-20 DIAGNOSIS — Z79890 Hormone replacement therapy (postmenopausal): Secondary | ICD-10-CM

## 2021-08-20 DIAGNOSIS — G4730 Sleep apnea, unspecified: Secondary | ICD-10-CM | POA: Diagnosis present

## 2021-08-20 DIAGNOSIS — G9340 Encephalopathy, unspecified: Secondary | ICD-10-CM | POA: Diagnosis present

## 2021-08-20 DIAGNOSIS — Z818 Family history of other mental and behavioral disorders: Secondary | ICD-10-CM

## 2021-08-20 DIAGNOSIS — Z882 Allergy status to sulfonamides status: Secondary | ICD-10-CM

## 2021-08-20 DIAGNOSIS — E1122 Type 2 diabetes mellitus with diabetic chronic kidney disease: Secondary | ICD-10-CM

## 2021-08-20 DIAGNOSIS — Z91041 Radiographic dye allergy status: Secondary | ICD-10-CM

## 2021-08-20 DIAGNOSIS — Z20822 Contact with and (suspected) exposure to covid-19: Secondary | ICD-10-CM | POA: Diagnosis present

## 2021-08-20 DIAGNOSIS — I110 Hypertensive heart disease with heart failure: Secondary | ICD-10-CM | POA: Diagnosis not present

## 2021-08-20 DIAGNOSIS — I509 Heart failure, unspecified: Secondary | ICD-10-CM

## 2021-08-20 DIAGNOSIS — Z743 Need for continuous supervision: Secondary | ICD-10-CM | POA: Diagnosis not present

## 2021-08-20 DIAGNOSIS — I130 Hypertensive heart and chronic kidney disease with heart failure and stage 1 through stage 4 chronic kidney disease, or unspecified chronic kidney disease: Principal | ICD-10-CM | POA: Diagnosis present

## 2021-08-20 DIAGNOSIS — Z91018 Allergy to other foods: Secondary | ICD-10-CM

## 2021-08-20 DIAGNOSIS — Z83438 Family history of other disorder of lipoprotein metabolism and other lipidemia: Secondary | ICD-10-CM

## 2021-08-20 DIAGNOSIS — R197 Diarrhea, unspecified: Secondary | ICD-10-CM | POA: Diagnosis not present

## 2021-08-20 DIAGNOSIS — Z825 Family history of asthma and other chronic lower respiratory diseases: Secondary | ICD-10-CM

## 2021-08-20 DIAGNOSIS — J9621 Acute and chronic respiratory failure with hypoxia: Secondary | ICD-10-CM | POA: Diagnosis not present

## 2021-08-20 DIAGNOSIS — E662 Morbid (severe) obesity with alveolar hypoventilation: Secondary | ICD-10-CM | POA: Diagnosis present

## 2021-08-20 DIAGNOSIS — I10 Essential (primary) hypertension: Secondary | ICD-10-CM

## 2021-08-20 DIAGNOSIS — Z888 Allergy status to other drugs, medicaments and biological substances status: Secondary | ICD-10-CM

## 2021-08-20 DIAGNOSIS — E7800 Pure hypercholesterolemia, unspecified: Secondary | ICD-10-CM | POA: Diagnosis present

## 2021-08-20 DIAGNOSIS — Z87891 Personal history of nicotine dependence: Secondary | ICD-10-CM

## 2021-08-20 DIAGNOSIS — N184 Chronic kidney disease, stage 4 (severe): Secondary | ICD-10-CM | POA: Diagnosis not present

## 2021-08-20 DIAGNOSIS — J811 Chronic pulmonary edema: Secondary | ICD-10-CM | POA: Diagnosis not present

## 2021-08-20 DIAGNOSIS — E8729 Other acidosis: Secondary | ICD-10-CM | POA: Diagnosis present

## 2021-08-20 DIAGNOSIS — Z91040 Latex allergy status: Secondary | ICD-10-CM

## 2021-08-20 DIAGNOSIS — K8010 Calculus of gallbladder with chronic cholecystitis without obstruction: Secondary | ICD-10-CM | POA: Diagnosis not present

## 2021-08-20 DIAGNOSIS — Z886 Allergy status to analgesic agent status: Secondary | ICD-10-CM

## 2021-08-20 DIAGNOSIS — I161 Hypertensive emergency: Secondary | ICD-10-CM | POA: Diagnosis not present

## 2021-08-20 DIAGNOSIS — F339 Major depressive disorder, recurrent, unspecified: Secondary | ICD-10-CM | POA: Diagnosis present

## 2021-08-20 DIAGNOSIS — E876 Hypokalemia: Secondary | ICD-10-CM | POA: Diagnosis not present

## 2021-08-20 DIAGNOSIS — K811 Chronic cholecystitis: Secondary | ICD-10-CM | POA: Diagnosis present

## 2021-08-20 DIAGNOSIS — Z7985 Long-term (current) use of injectable non-insulin antidiabetic drugs: Secondary | ICD-10-CM

## 2021-08-20 LAB — CBC WITH DIFFERENTIAL/PLATELET
Abs Immature Granulocytes: 0.1 10*3/uL — ABNORMAL HIGH (ref 0.00–0.07)
Basophils Absolute: 0 10*3/uL (ref 0.0–0.1)
Basophils Absolute: 0 10*3/uL (ref 0.0–0.1)
Basophils Relative: 0.3 % (ref 0.0–3.0)
Basophils Relative: 0 %
Eosinophils Absolute: 0.1 10*3/uL (ref 0.0–0.7)
Eosinophils Absolute: 0.1 10*3/uL (ref 0.0–0.5)
Eosinophils Relative: 2 % (ref 0.0–5.0)
Eosinophils Relative: 1 %
HCT: 29.8 % — ABNORMAL LOW (ref 36.0–46.0)
HCT: 35.6 % — ABNORMAL LOW (ref 36.0–46.0)
Hemoglobin: 10 g/dL — ABNORMAL LOW (ref 12.0–15.0)
Hemoglobin: 10.7 g/dL — ABNORMAL LOW (ref 12.0–15.0)
Immature Granulocytes: 1 %
Lymphocytes Relative: 9.3 % — ABNORMAL LOW (ref 12.0–46.0)
Lymphocytes Relative: 8 %
Lymphs Abs: 0.6 10*3/uL — ABNORMAL LOW (ref 0.7–4.0)
Lymphs Abs: 0.8 10*3/uL (ref 0.7–4.0)
MCH: 30.9 pg (ref 26.0–34.0)
MCHC: 33.5 g/dL (ref 30.0–36.0)
MCHC: 30.1 g/dL (ref 30.0–36.0)
MCV: 93.2 fl (ref 78.0–100.0)
MCV: 102.9 fL — ABNORMAL HIGH (ref 80.0–100.0)
Monocytes Absolute: 0.5 10*3/uL (ref 0.1–1.0)
Monocytes Absolute: 0.5 10*3/uL (ref 0.1–1.0)
Monocytes Relative: 7.8 % (ref 3.0–12.0)
Monocytes Relative: 5 %
Neutro Abs: 4.9 10*3/uL (ref 1.4–7.7)
Neutro Abs: 8.6 10*3/uL — ABNORMAL HIGH (ref 1.7–7.7)
Neutrophils Relative %: 80.6 % — ABNORMAL HIGH (ref 43.0–77.0)
Neutrophils Relative %: 85 %
Platelets: 143 10*3/uL — ABNORMAL LOW (ref 150.0–400.0)
Platelets: 163 10*3/uL (ref 150–400)
RBC: 3.2 Mil/uL — ABNORMAL LOW (ref 3.87–5.11)
RBC: 3.46 MIL/uL — ABNORMAL LOW (ref 3.87–5.11)
RDW: 17.3 % — ABNORMAL HIGH (ref 11.5–15.5)
RDW: 16.3 % — ABNORMAL HIGH (ref 11.5–15.5)
WBC: 6.1 10*3/uL (ref 4.0–10.5)
WBC: 10.2 10*3/uL (ref 4.0–10.5)
nRBC: 0 % (ref 0.0–0.2)

## 2021-08-20 LAB — I-STAT VENOUS BLOOD GAS, ED
Acid-Base Excess: 7 mmol/L — ABNORMAL HIGH (ref 0.0–2.0)
Bicarbonate: 36.6 mmol/L — ABNORMAL HIGH (ref 20.0–28.0)
Calcium, Ion: 1.21 mmol/L (ref 1.15–1.40)
HCT: 33 % — ABNORMAL LOW (ref 36.0–46.0)
Hemoglobin: 11.2 g/dL — ABNORMAL LOW (ref 12.0–15.0)
O2 Saturation: 100 %
Potassium: 4 mmol/L (ref 3.5–5.1)
Sodium: 142 mmol/L (ref 135–145)
TCO2: 39 mmol/L — ABNORMAL HIGH (ref 22–32)
pCO2, Ven: 80.2 mmHg (ref 44.0–60.0)
pH, Ven: 7.267 (ref 7.250–7.430)
pO2, Ven: 217 mmHg — ABNORMAL HIGH (ref 32.0–45.0)

## 2021-08-20 LAB — URINALYSIS, ROUTINE W REFLEX MICROSCOPIC
Bilirubin Urine: NEGATIVE
Bilirubin Urine: NEGATIVE
Glucose, UA: NEGATIVE mg/dL
Ketones, ur: NEGATIVE
Ketones, ur: NEGATIVE mg/dL
Nitrite: NEGATIVE
Nitrite: NEGATIVE
Protein, ur: 100 mg/dL — AB
Specific Gravity, Urine: 1.02 (ref 1.000–1.030)
Specific Gravity, Urine: 1.025 (ref 1.005–1.030)
Total Protein, Urine: 100 — AB
Urine Glucose: NEGATIVE
Urobilinogen, UA: 0.2 (ref 0.0–1.0)
pH: 5.5 (ref 5.0–8.0)
pH: 5.5 (ref 5.0–8.0)

## 2021-08-20 LAB — PROTIME-INR
INR: 1.1 (ref 0.8–1.2)
Prothrombin Time: 14.1 seconds (ref 11.4–15.2)

## 2021-08-20 LAB — COMPREHENSIVE METABOLIC PANEL
ALT: 9 U/L (ref 0–35)
ALT: 16 U/L (ref 0–44)
AST: 10 U/L (ref 0–37)
AST: 16 U/L (ref 15–41)
Albumin: 3.8 g/dL (ref 3.5–5.2)
Albumin: 3.6 g/dL (ref 3.5–5.0)
Alkaline Phosphatase: 76 U/L (ref 39–117)
Alkaline Phosphatase: 83 U/L (ref 38–126)
Anion gap: 11 (ref 5–15)
BUN: 73 mg/dL — ABNORMAL HIGH (ref 6–23)
BUN: 71 mg/dL — ABNORMAL HIGH (ref 8–23)
CO2: 38 mEq/L — ABNORMAL HIGH (ref 19–32)
CO2: 31 mmol/L (ref 22–32)
Calcium: 9.7 mg/dL (ref 8.4–10.5)
Calcium: 9.3 mg/dL (ref 8.9–10.3)
Chloride: 99 mEq/L (ref 96–112)
Chloride: 99 mmol/L (ref 98–111)
Creatinine, Ser: 2.35 mg/dL — ABNORMAL HIGH (ref 0.40–1.20)
Creatinine, Ser: 2.34 mg/dL — ABNORMAL HIGH (ref 0.44–1.00)
GFR, Estimated: 20 mL/min — ABNORMAL LOW (ref >60)
GFR: 18.62 mL/min — ABNORMAL LOW (ref >60.00)
Glucose, Bld: 117 mg/dL — ABNORMAL HIGH (ref 70–99)
Glucose, Bld: 209 mg/dL — ABNORMAL HIGH (ref 70–99)
Potassium: 4.4 mEq/L (ref 3.5–5.1)
Potassium: 4 mmol/L (ref 3.5–5.1)
Sodium: 143 mEq/L (ref 135–145)
Sodium: 141 mmol/L (ref 135–145)
Total Bilirubin: 0.7 mg/dL (ref 0.2–1.2)
Total Bilirubin: 1 mg/dL (ref 0.3–1.2)
Total Protein: 7.3 g/dL (ref 6.0–8.3)
Total Protein: 7.5 g/dL (ref 6.5–8.1)

## 2021-08-20 LAB — CBG MONITORING, ED: Glucose-Capillary: 132 mg/dL — ABNORMAL HIGH (ref 70–99)

## 2021-08-20 LAB — LACTIC ACID, PLASMA
Lactic Acid, Venous: 0.8 mmol/L (ref 0.5–1.9)
Lactic Acid, Venous: 0.9 mmol/L (ref 0.5–1.9)

## 2021-08-20 LAB — URINALYSIS, MICROSCOPIC (REFLEX): WBC, UA: >50 WBC/hpf (ref 0–5)

## 2021-08-20 LAB — TSH: TSH: 0.53 u[IU]/mL (ref 0.35–5.50)

## 2021-08-20 LAB — TROPONIN I (HIGH SENSITIVITY)
High Sens Troponin I: 19 ng/L (ref 2–17)
Troponin I (High Sensitivity): 31 ng/L — ABNORMAL HIGH (ref <18)
Troponin I (High Sensitivity): 37 ng/L — ABNORMAL HIGH (ref <18)

## 2021-08-20 LAB — RESP PANEL BY RT-PCR (FLU A&B, COVID) ARPGX2
Influenza A by PCR: NEGATIVE
Influenza B by PCR: NEGATIVE
SARS Coronavirus 2 by RT PCR: NEGATIVE

## 2021-08-20 LAB — BRAIN NATRIURETIC PEPTIDE
B Natriuretic Peptide: 237.9 pg/mL — ABNORMAL HIGH (ref 0.0–100.0)
Pro B Natriuretic peptide (BNP): 279 pg/mL — ABNORMAL HIGH (ref 0.0–100.0)

## 2021-08-20 MED ORDER — FUROSEMIDE 10 MG/ML IJ SOLN
60.0000 mg | Freq: Once | INTRAMUSCULAR | Status: AC
  Administered 2021-08-20: 19:00:00 60 mg via INTRAVENOUS
  Filled 2021-08-20: qty 6

## 2021-08-20 MED ORDER — HYOSCYAMINE SULFATE 0.125 MG PO TABS
0.1250 mg | ORAL_TABLET | Freq: Every day | ORAL | Status: DC
  Filled 2021-08-20: qty 1

## 2021-08-20 MED ORDER — ACETAMINOPHEN 325 MG PO TABS
650.0000 mg | ORAL_TABLET | Freq: Four times a day (QID) | ORAL | Status: DC | PRN
  Administered 2021-08-23 – 2021-08-24 (×3): 650 mg via ORAL
  Filled 2021-08-20 (×3): qty 2

## 2021-08-20 MED ORDER — SODIUM CHLORIDE 0.9% FLUSH
3.0000 mL | Freq: Two times a day (BID) | INTRAVENOUS | Status: DC
  Administered 2021-08-20 – 2021-08-27 (×13): 3 mL via INTRAVENOUS

## 2021-08-20 MED ORDER — AMLODIPINE BESYLATE 5 MG PO TABS: 7.5000 mg | ORAL_TABLET | Freq: Every day | ORAL | Status: DC

## 2021-08-20 MED ORDER — ONDANSETRON HCL 4 MG PO TABS: 4.0000 mg | ORAL_TABLET | Freq: Four times a day (QID) | ORAL | Status: DC | PRN

## 2021-08-20 MED ORDER — LEVOTHYROXINE SODIUM 112 MCG PO TABS
112.0000 ug | ORAL_TABLET | Freq: Every day | ORAL | Status: DC
  Administered 2021-08-22 – 2021-08-27 (×6): 112 ug via ORAL
  Filled 2021-08-20 (×6): qty 1

## 2021-08-20 MED ORDER — INSULIN GLARGINE-YFGN 100 UNIT/ML ~~LOC~~ SOLN
25.0000 [IU] | Freq: Every day | SUBCUTANEOUS | Status: DC
  Administered 2021-08-21: 25 [IU] via SUBCUTANEOUS
  Filled 2021-08-20 (×3): qty 0.25

## 2021-08-20 MED ORDER — HEPARIN SODIUM (PORCINE) 5000 UNIT/ML IJ SOLN
5000.0000 [IU] | Freq: Three times a day (TID) | INTRAMUSCULAR | Status: DC
  Administered 2021-08-21 – 2021-08-27 (×20): 5000 [IU] via SUBCUTANEOUS
  Filled 2021-08-20 (×21): qty 1

## 2021-08-20 MED ORDER — ATORVASTATIN CALCIUM 40 MG PO TABS
40.0000 mg | ORAL_TABLET | Freq: Every day | ORAL | Status: DC
  Administered 2021-08-23 – 2021-08-27 (×5): 40 mg via ORAL
  Filled 2021-08-20 (×7): qty 1

## 2021-08-20 MED ORDER — NITROGLYCERIN IN D5W 200-5 MCG/ML-% IV SOLN
0.0000 ug/min | INTRAVENOUS | Status: DC
  Administered 2021-08-20: 19:00:00 5 ug/min via INTRAVENOUS
  Filled 2021-08-20: qty 250

## 2021-08-20 MED ORDER — INSULIN ASPART 100 UNIT/ML IJ SOLN
0.0000 [IU] | INTRAMUSCULAR | Status: DC
  Administered 2021-08-21: 1 [IU] via SUBCUTANEOUS
  Administered 2021-08-22 (×4): 2 [IU] via SUBCUTANEOUS
  Administered 2021-08-23: 3 [IU] via SUBCUTANEOUS
  Administered 2021-08-23 (×3): 2 [IU] via SUBCUTANEOUS

## 2021-08-20 MED ORDER — LABETALOL HCL 5 MG/ML IV SOLN
10.0000 mg | INTRAVENOUS | Status: DC | PRN
Start: 2021-08-20 — End: 2021-08-22
  Administered 2021-08-21: 10 mg via INTRAVENOUS
  Filled 2021-08-20: qty 4

## 2021-08-20 MED ORDER — FUROSEMIDE 10 MG/ML IJ SOLN
40.0000 mg | Freq: Two times a day (BID) | INTRAMUSCULAR | Status: DC
  Administered 2021-08-21 – 2021-08-22 (×3): 40 mg via INTRAVENOUS
  Filled 2021-08-20 (×3): qty 4

## 2021-08-20 MED ORDER — SERTRALINE HCL 100 MG PO TABS
100.0000 mg | ORAL_TABLET | Freq: Every day | ORAL | Status: DC
  Administered 2021-08-21 – 2021-08-26 (×6): 100 mg via ORAL
  Filled 2021-08-20 (×6): qty 1

## 2021-08-20 MED ORDER — SENNOSIDES-DOCUSATE SODIUM 8.6-50 MG PO TABS: 1.0000 | ORAL_TABLET | Freq: Every evening | ORAL | Status: DC | PRN

## 2021-08-20 MED ORDER — LANTUS SOLOSTAR 100 UNIT/ML ~~LOC~~ SOPN: 46.0000 [IU] | PEN_INJECTOR | Freq: Every day | SUBCUTANEOUS | 5 refills | Status: DC

## 2021-08-20 MED ORDER — ONDANSETRON HCL 4 MG/2ML IJ SOLN: 4.0000 mg | Freq: Four times a day (QID) | INTRAMUSCULAR | Status: DC | PRN

## 2021-08-20 MED ORDER — SODIUM CHLORIDE 0.9 % IV SOLN
1.0000 g | Freq: Once | INTRAVENOUS | Status: AC
  Administered 2021-08-20: 20:00:00 1 g via INTRAVENOUS
  Filled 2021-08-20: qty 10

## 2021-08-20 MED ORDER — ACETAMINOPHEN 650 MG RE SUPP: 650.0000 mg | Freq: Four times a day (QID) | RECTAL | Status: DC | PRN

## 2021-08-20 NOTE — Telephone Encounter (Signed)
Spoke with the patient's daughter and informed her of the message below.  She stated she will let the patient know and will have EMS transport her.

## 2021-08-20 NOTE — ED Notes (Signed)
Culture sent down with UA

## 2021-08-20 NOTE — Progress Notes (Signed)
[name] DOB: 1935/06/13 Encounter date: 08/20/2021  This is a 86 y.o. female who presents with Chief Complaint  Patient presents with   Follow-up    History of present illness:  Saw pulm on Monday; had cxr that showed effusions. Pulm told her to increase to 60mg  total torsemide daily dose and she has done this since Monday. Has lost 7 lbs since yesterday. Definitely feels she is breathing better today than she was on Monday. Was having to breath in middle of a word previously. Does feel like she is turning a corner.   Not coughing, not running a fever. Still on 3L of oxygen and still just at 92% at home. Had increase oxygen from 2-3 in the last week. Has been better in last day.   Difficulty with breathing makes her anxious.   No chest pain or pressure. Needing to sit up with feet planted to help her feel like she is breathing better. Legs are staying swollen. Wearing compression daily. She does feel like leg swelling is doing better with coompression.  Feels like there is a gland that is swollen in throat. Used to bother her with swallowing. Still feels like there is lump but it is getting smaller. Pulmonologist told her to eat lemon/citrus.   Got iron infusion on Thursday. Does think that breathing has been issue for sure since weds, but she has slowed down overall from [birth year] (birthday) but they attributed this to not getting iron infusion on time.   Nose drips from humidified oxygen.   After leaving oncology - did say she thought she had UTI. Hasn't been able to get this.   Got constipated, but this was better yesterday.   Feels nauseated. Appetite is poor but she does feel better in last 24 hours.    Feeling a little down since birthday. Zoloft makes her tired. Didn't feel as tired with just the single dose of the zoloft. She is having more severe panic attacks.   HR rarely above 59 w metoprolol which she had recognized and stopped taking.  Seeing cardiology on  Friday, Dr. Rayann Heman. She is following with both him and Dr. Harrell Gave.   Allergies  Allergen Reactions   Adhesive [Tape] Other (See Comments)    PULLS OFF THE SKIN   Apixaban Other (See Comments)    Internal Bleeding   Aspirin Itching, Rash, Hives and Swelling    Swelling of her tongue   Mirabegron Other (See Comments)    Patient experienced A-Fib   Pineapple Anaphylaxis and Swelling    Throat swells and blisters on tongue and roof of mouth per patient   Metformin Diarrhea and Nausea Only   Tetracycline Hives   Fluticasone-Salmeterol Itching and Rash   Iodinated Diagnostic Agents Itching and Rash        Lactose Intolerance (Gi) Other (See Comments)    Gas    Latex Itching, Rash and Other (See Comments)    Pulls off the skin and causes welts   Lisinopril Cough   Metronidazole Other (See Comments)    Reaction not known   Sulfa Antibiotics Rash   Sulfonamide Derivatives Itching and Rash   Current Meds  Medication Sig   acetaminophen (TYLENOL) 325 MG tablet Take 650 mg by mouth in the morning and at bedtime.   amLODipine (NORVASC) 2.5 MG tablet Take 3 tablets (7.5 mg total) by mouth daily.   amoxicillin (AMOXIL) 500 MG tablet Take 2,000 mg by mouth as needed. Prior to Dental Appt  atorvastatin (LIPITOR) 40 MG tablet TAKE 1 TABLET(40 MG) BY MOUTH AT BEDTIME   BD PEN NEEDLE NANO 2ND GEN 32G X 4 MM MISC USE TO INJECT UP TO FOUR TIMES DAILY AS DIRECTED   bisacodyl (DULCOLAX) 5 MG EC tablet Take 10 mg by mouth every 3 (three) days as needed (for constipation).   Bismuth Tribromoph-Petrolatum (XEROFORM PETROLAT PATCH 4"X4") PADS Apply 1 Units topically daily as needed.   Cholecalciferol (VITAMIN D3) 2000 units TABS Take 5,000 Units by mouth daily.   Continuous Blood Gluc Sensor (FREESTYLE LIBRE 14 DAY SENSOR) MISC APPLY NEW SENSOR EVERY 14 DAYS AS DIRECTED   Dulaglutide (TRULICITY) 5.36 RW/4.3XV SOPN Inject 0.75 mg into the skin once a week.   fexofenadine (ALLEGRA) 180 MG tablet  Take 180 mg by mouth daily.   hyoscyamine (LEVSIN) 0.125 MG tablet Take 1 tablet by mouth daily.   insulin glargine (LANTUS SOLOSTAR) 100 UNIT/ML Solostar Pen Inject 46 Units into the skin at bedtime.   insulin lispro (HUMALOG KWIKPEN) 100 UNIT/ML KwikPen INJECT 13 TO 20 UNITS UNDER THE SKIN THREE TIMES DAILY WITH BREAKFAST, LUNCH AND EVENING MEAL   Insulin Syringe-Needle U-100 (INSULIN SYRINGE .3CC/29GX1/2") 29G X 1/2" 0.3 ML MISC Use to inject insulin 4 times a day.   levothyroxine (SYNTHROID) 112 MCG tablet TAKE 1 TABLET(112 MCG) BY MOUTH DAILY   Multiple Vitamin (MULTIVITAMIN) tablet Take 1 tablet by mouth daily.   sertraline (ZOLOFT) 50 MG tablet TAKE 1 TABLET BY MOUTH AT BEDTIME (Patient taking differently: 100 mg. Take 2 daily)   torsemide (DEMADEX) 20 MG tablet Take 40 mg by mouth every morning.   vitamin B-12 (CYANOCOBALAMIN) 100 MCG tablet Take 100 mcg by mouth daily.   vitamin C (ASCORBIC ACID) 250 MG tablet Take 250 mg by mouth daily.    Review of Systems  Constitutional:  Positive for activity change, appetite change and fatigue. Negative for chills and fever.  Respiratory:  Positive for shortness of breath. Negative for cough, chest tightness and wheezing.   Cardiovascular:  Positive for leg swelling. Negative for chest pain and palpitations.  Gastrointestinal:  Positive for nausea.  Genitourinary:  Negative for dysuria (no specific urinary sx; but feels run down like she has in past with UTI).  Hematological:        Large gland swollen left sub mandibular    Objective:  BP (!) 128/58 (BP Location: Left Arm, Patient Position: Sitting, Cuff Size: Large)    Pulse (!) 57    Temp 97.7 F (36.5 C) (Oral)    Ht 5\' 1"  (1.549 m)    LMP  (LMP Unknown)    SpO2 91%    BMI 42.51 kg/m       BP Readings from Last 3 Encounters:  08/20/21 (!) 128/58  08/18/21 124/60  08/14/21 (!) 158/54   Wt Readings from Last 3 Encounters:  08/18/21 225 lb (102.1 kg)  08/14/21 225 lb (102.1 kg)   08/06/21 222 lb (100.7 kg)    Physical Exam Constitutional:      Appearance: She is obese. She is ill-appearing.     Interventions: Nasal cannula in place.     Comments: She is sleepy during exam, she appropriately answers questions but dozes off between talking  HENT:     Head:     Salivary Glands: Left salivary gland is diffusely enlarged (approx 4cm). Left salivary gland is not tender.  Cardiovascular:     Rate and Rhythm: Normal rate.     Heart sounds: Normal  heart sounds.  Pulmonary:     Effort: Tachypnea present.     Breath sounds: Examination of the right-lower field reveals decreased breath sounds and rales. Examination of the left-lower field reveals decreased breath sounds and rales. Decreased breath sounds and rales present.  Abdominal:     General: Abdomen is protuberant. Bowel sounds are normal.     Palpations: Abdomen is soft.     Tenderness: There is no abdominal tenderness.  Musculoskeletal:     Right lower leg: 2+ Pitting Edema present.     Left lower leg: 2+ Pitting Edema present.    Assessment/Plan  1. Urinary frequency Checking urine today. Was previously unable to bring in specimen.  - Urine Culture; Future - Urinalysis; Future - Urinalysis - Urine Culture  2. Fatigue, unspecified type She is not feeling well. We are going to start with bloodwork and will determine additional evaluation pending these results.  Although she was blaming her not feeling well on her most recent birthday, something is changed from her baseline. - CBC with Differential/Platelet; Future - Comprehensive metabolic panel; Future - Comprehensive metabolic panel - CBC with Differential/Platelet  3. Acute pulmonary edema (HCC) She is doing better after diuresing in the last 48 hours, but is still having some shortness of breath requiring increased oxygen demand.  Would like to recheck chest x-ray for comparison.  Continue with fluid pill.  We will check in with her once we get  results.  4. Shortness of breath See above. - Brain natriuretic peptide; Future - DG Chest 2 View; Future - Brain natriuretic peptide - Troponin I (High Sensitivity); Future - Troponin I (High Sensitivity)  5. Essential hypertension Blood pressure stable today.  Continue current medications.  6. Hypothyroidism, unspecified type - TSH; Future - TSH  7. Enlarged parotid gland - has improved in size in last 48 hours; we will further direct pending lab results/patient progress.  Return for pending results. Post timed addendum: urgent lab result call with elevated troponin of 19 on 12/14; patient was instructed to go to ER for further evaluation and treatment.   40 minutes spent in chart review, time with patient, discussion of follow up plan.   Micheline Rough, MD

## 2021-08-20 NOTE — H&P (Addendum)
History and Physical    Saren Corkern GUY:[id number] DOB: 11/23/1934 DOA: 08/20/2021  PCP: Caren Macadam, MD   Patient coming from: Home   Chief Complaint: SOB, somnolence   HPI: [name] is a pleasant 86 y.o. female with medical history significant for OSA on CPAP, chronic diastolic CHF, PAF not anticoagulated due to history of GI bleed, ITP, IDA, CKD stage IV, IDDM, depression, and anxiety, now presenting to the emergency department with worsening shortness of breath and somnolence.  The patient is accompanied by her daughter who assists with the history.  Patient lives alone but has daughters who check on her frequently and have noted that the patient has become progressively short of breath over the past week or more and has been progressively fatigued and now somnolent.  She had increased leg swelling and reported a sensation of fullness around her abdomen, had her diuretic increased 2 days ago and lost 7 pounds since then but continued to have increased shortness of breath and somnolence.  She uses a walker at baseline.  For the past month has wanted family to put her in a wheelchair, had not been taking her diuretic consistently due to not wanting to have to go to the restroom, and had been urinating on herself.  ED Course: Upon arrival to the ED, patient is found to be afebrile, saturating 86% on 6 L/min of supplemental oxygen, and with initial SBP of 215.  EKG features accelerated junctional rhythm.  Chest x-ray with worsening diffuse bilateral airspace disease and small bilateral pleural effusions.  Chemistry panel notable for glucose 209 and creatinine 2.34.  CBC with hemoglobin 10.7 and MCV 102.9.  ABG with pH 2.67 and pCO2 80.2.  Patient was placed on BiPAP and treated with IV Lasix, Rocephin, and briefly with nitroglycerin infusion in the ED.  Review of Systems:  Unable to complete ROS secondary to the patient's clinical condition.  Past Medical History:   Diagnosis Date   Allergic rhinitis    Anxiety state, unspecified    panic attacks   CHF (congestive heart failure) (HCC)    Depressive disorder, not elsewhere classified    Disorientation, unspecified 08/07/2019   Epistaxis 03/24/2018   Extrinsic asthma, unspecified    no problem since adulthood   History of complications due to general anesthesia 03/03/2021   History of pulmonary embolism 08/07/2019   Hypertensive urgency 01/30/2021   Obesity    OSA on CPAP    severe   Pneumonitis 07/14/2019   Pressure injury of skin 07/10/2019   Pulmonary embolism ([place])    Pure hypercholesterolemia    Respiratory failure with hypoxia ([place]) 09/2008   acute, secondary to multiple bilateral pulmonary embolism , negative hypercoagulable workup 09/2008 hospital stay   Scoliosis    Type II or unspecified type diabetes mellitus without mention of complication, not stated as uncontrolled    Unspecified hypothyroidism    hypo    Past Surgical History:  Procedure Laterality Date   CARDIOVERSION N/A 06/26/2019   Procedure: CARDIOVERSION;  Surgeon: Jerline Pain, MD;  Location: [facility];  Service: Cardiovascular;  Laterality: N/A;   EYE SURGERY Bilateral 2005   cataracts, implants   KIDNEY STONE SURGERY Bilateral    2016, 2017    knee replaced Left 2013   THYROID SURGERY  1966   nodule removal; partial thyroidectomy; radioactive iodine x2; 1990's    Social History:   reports that she quit smoking about 31 years ago. Her smoking use included cigarettes.  She started smoking about 64 years ago. She has a 5.00 pack-year smoking history. She has never used smokeless tobacco. She reports that she does not drink alcohol and does not use drugs.  Allergies  Allergen Reactions   Adhesive [Tape] Other (See Comments)    PULLS OFF THE SKIN   Apixaban Other (See Comments)    Internal Bleeding   Aspirin Itching, Rash, Hives and Swelling    Swelling of her tongue   Mirabegron Other (See Comments)     Patient experienced A-Fib   Pineapple Anaphylaxis and Swelling    Throat swells and blisters on tongue and roof of mouth per patient   Metformin Diarrhea and Nausea Only   Tetracycline Hives   Fluticasone-Salmeterol Itching and Rash   Iodinated Diagnostic Agents Itching and Rash        Lactose Intolerance (Gi) Other (See Comments)    Gas    Latex Itching, Rash and Other (See Comments)    Pulls off the skin and causes welts   Lisinopril Cough   Metronidazole Other (See Comments)    Reaction not known   Sulfa Antibiotics Rash   Sulfonamide Derivatives Itching and Rash    Family History  Problem Relation Age of Onset   Hypertension Father    Hyperlipidemia Father    Allergies Mother    Clotting disorder Mother    Osteoarthritis Mother    Asthma Mother    Hypertension Mother    Stroke Mother    Coronary artery disease Other    Stroke Other    Rheum arthritis Maternal Grandmother    Clotting disorder Maternal Grandmother    Clotting disorder Maternal Uncle    Arthritis Daughter    Diabetes Daughter    High blood pressure Daughter    High Cholesterol Daughter    Depression Maternal Grandfather    Diabetes Paternal Grandmother    Breast cancer Neg Hx      Prior to Admission medications   Medication Sig Start Date End Date Taking? Authorizing Provider  acetaminophen (TYLENOL) 325 MG tablet Take 650 mg by mouth in the morning and at bedtime.   Yes [provider]  amLODipine (NORVASC) 2.5 MG tablet Take 3 tablets (7.5 mg total) by mouth daily. 02/18/21  Yes Koberlein, Junell C, MD  amoxicillin (AMOXIL) 500 MG tablet Take 2,000 mg by mouth as needed. Prior to Dental Appt 02/18/21  Yes [provider]  atorvastatin (LIPITOR) 40 MG tablet TAKE 1 TABLET(40 MG) BY MOUTH AT BEDTIME Patient taking differently: Take 40 mg by mouth at bedtime. 05/20/21  Yes Koberlein, Steele Berg, MD  BD PEN NEEDLE NANO 2ND GEN 32G X 4 MM MISC USE TO INJECT UP TO FOUR TIMES DAILY AS  DIRECTED Patient taking differently: 1 each by Other route See admin instructions. USE TO INJECT UP TO FOUR TIMES DAILY AS DIRECTED 02/27/21  Yes Philemon Kingdom, MD  bisacodyl (DULCOLAX) 5 MG EC tablet Take 10 mg by mouth every 3 (three) days as needed (for constipation).   Yes [provider]  Bismuth Tribromoph-Petrolatum (XEROFORM PETROLAT PATCH 4"X4") PADS Apply 1 Units topically daily as needed. 11/08/20  Yes Koberlein, Steele Berg, MD  Cholecalciferol (VITAMIN D3) 2000 units TABS Take 5,000 Units by mouth daily.   Yes [provider]  Continuous Blood Gluc Sensor (FREESTYLE LIBRE 14 DAY SENSOR) MISC APPLY NEW SENSOR EVERY 14 DAYS AS DIRECTED Patient taking differently: 1 each by Other route See admin instructions. APPLY NEW SENSOR EVERY 14 DAYS  AS DIRECTED 12/16/20  Yes Philemon Kingdom, MD  Dulaglutide (TRULICITY) 3.22 GU/5.4YH SOPN Inject 0.75 mg into the skin once a week. Patient taking differently: Inject 0.75 mg into the skin once a week. sunday 05/26/21  Yes Philemon Kingdom, MD  fexofenadine (ALLEGRA) 180 MG tablet Take 180 mg by mouth daily. 01/04/20  Yes [provider]  hyoscyamine (LEVSIN) 0.125 MG tablet Take 1 tablet by mouth daily. 01/06/20  Yes [provider]  insulin glargine (LANTUS SOLOSTAR) 100 UNIT/ML Solostar Pen Inject 46 Units into the skin at bedtime. 08/20/21  Yes Koberlein, Junell C, MD  insulin lispro (HUMALOG KWIKPEN) 100 UNIT/ML KwikPen INJECT 13 TO 20 UNITS UNDER THE SKIN THREE TIMES DAILY WITH BREAKFAST, LUNCH AND EVENING MEAL Patient taking differently: Inject 13-20 Units into the skin See admin instructions. INJECT 13 TO 20 UNITS UNDER THE SKIN THREE TIMES DAILY WITH BREAKFAST, LUNCH AND EVENING MEAL 08/18/21  Yes Philemon Kingdom, MD  Insulin Syringe-Needle U-100 (INSULIN SYRINGE .3CC/29GX1/2") 29G X 1/2" 0.3 ML MISC Use to inject insulin 4 times a day. Patient taking differently: 1 each by Other route See admin instructions. Use  to inject insulin 4 times a day. 01/02/20  Yes Philemon Kingdom, MD  levothyroxine (SYNTHROID) 112 MCG tablet TAKE 1 TABLET(112 MCG) BY MOUTH DAILY Patient taking differently: Take 112 mcg by mouth daily before breakfast. 04/21/21  Yes Philemon Kingdom, MD  Multiple Vitamin (MULTIVITAMIN) tablet Take 1 tablet by mouth daily.   Yes [provider]  potassium citrate (UROCIT-K) 10 MEQ (1080 MG) SR tablet Take 20 mEq by mouth 2 (two) times daily. 05/22/21  Yes [provider]  sertraline (ZOLOFT) 50 MG tablet TAKE 1 TABLET BY MOUTH AT BEDTIME Patient taking differently: 100 mg. Take 2 daily 07/19/21  Yes Koberlein, Junell C, MD  torsemide (DEMADEX) 20 MG tablet Take 60 mg by mouth every morning. 04/25/21  Yes [provider]  vitamin B-12 (CYANOCOBALAMIN) 100 MCG tablet Take 100 mcg by mouth daily.   Yes [provider]  vitamin C (ASCORBIC ACID) 250 MG tablet Take 250 mg by mouth daily.   Yes [provider]    Physical Exam: Vitals:   08/20/21 2100 08/20/21 2115 08/20/21 2130 08/20/21 2215  BP: (!) 142/45 (!) 147/44 (!) 120/45 (!) 159/45  Pulse: (!) 52 (!) 50 (!) 51 (!) 48  Resp: (!) 22 18 20  (!) 22  Temp:      TempSrc:      SpO2: 96% 98% 100% 99%  Weight:      Height:        Constitutional: NAD, calm  Eyes: PERTLA, lids and conjunctivae normal ENMT: Mucous membranes are moist. Posterior pharynx clear of any exudate or lesions.   Neck: supple, no masses  Respiratory: Fine rales bilaterally. No accessory muscle use.  Cardiovascular: S1 & S2 heard, regular rate and rhythm. Pretibial pitting edema bilaterally.  Abdomen: No distension, no tenderness, soft. Bowel sounds active.  Musculoskeletal: no clubbing / cyanosis. No joint deformity upper and lower extremities.   Skin: Hyperpigmentation in gaiter distribution bilaterally. Warm, dry, well-perfused. Neurologic: CN 2-12 grossly intact. Moving all extremities. Somnolent, wakes to loud voice and  makes brief eye contact but only answers 1 or 2 yes/no questions before falling asleep.    Labs and Imaging on Admission: I have personally reviewed following labs and imaging studies  CBC: Recent Labs  Lab 08/14/21 1317 08/20/21 1142 08/20/21 1802 08/20/21 1911  WBC 6.5 6.1 10.2  --  NEUTROABS 5.1 4.9 8.6*  --   HGB 9.6* 10.0* 10.7* 11.2*  HCT 31.1* 29.8* 35.6* 33.0*  MCV 97.5 93.2 102.9*  --   PLT 107* 143.0* 163  --    Basic Metabolic Panel: Recent Labs  Lab 08/14/21 1317 08/20/21 1142 08/20/21 1802 08/20/21 1911  NA 145 143 141 142  K 5.0 4.4 4.0 4.0  CL 102 99 99  --   CO2 36* 38* 31  --   GLUCOSE 121* 117* 209*  --   BUN 60* 73* 71*  --   CREATININE 2.52* 2.35* 2.34*  --   CALCIUM 9.8 9.7 9.3  --    GFR: Estimated Creatinine Clearance: 19.4 mL/min (A) (by C-G formula based on SCr of 2.34 mg/dL (H)). Liver Function Tests: Recent Labs  Lab 08/14/21 1317 08/20/21 1142 08/20/21 1802  AST 12* 10 16  ALT 13 9 16   ALKPHOS 69 76 83  BILITOT 0.6 0.7 1.0  PROT 6.9 7.3 7.5  ALBUMIN 3.8 3.8 3.6   No results for input(s): LIPASE, AMYLASE in the last 168 hours. No results for input(s): AMMONIA in the last 168 hours. Coagulation Profile: Recent Labs  Lab 08/20/21 1802  INR 1.1   Cardiac Enzymes: No results for input(s): CKTOTAL, CKMB, CKMBINDEX, TROPONINI in the last 168 hours. BNP (last 3 results) Recent Labs    02/17/21 1418 03/26/21 1424 08/20/21 1142  PROBNP 70.0 168.0* 279.0*   HbA1C: No results for input(s): HGBA1C in the last 72 hours. CBG: No results for input(s): GLUCAP in the last 168 hours. Lipid Profile: No results for input(s): CHOL, HDL, LDLCALC, TRIG, CHOLHDL, LDLDIRECT in the last 72 hours. Thyroid Function Tests: Recent Labs    08/20/21 1142  TSH 0.53   Anemia Panel: No results for input(s): VITAMINB12, FOLATE, FERRITIN, TIBC, IRON, RETICCTPCT in the last 72 hours. Urine analysis:    Component Value Date/Time   COLORURINE  YELLOW 08/20/2021 1802   APPEARANCEUR HAZY (A) 08/20/2021 1802   LABSPEC 1.025 08/20/2021 1802   PHURINE 5.5 08/20/2021 1802   GLUCOSEU NEGATIVE 08/20/2021 1802   GLUCOSEU NEGATIVE 08/20/2021 1207   HGBUR TRACE (A) 08/20/2021 1802   HGBUR negative 08/05/2010 1523   BILIRUBINUR NEGATIVE 08/20/2021 1802   BILIRUBINUR N 12/15/2016 1059   KETONESUR NEGATIVE 08/20/2021 1802   PROTEINUR 100 (A) 08/20/2021 1802   UROBILINOGEN 0.2 08/20/2021 1207   NITRITE NEGATIVE 08/20/2021 1802   LEUKOCYTESUR SMALL (A) 08/20/2021 1802   Sepsis Labs: @LABRCNTIP (procalcitonin:4,lacticidven:4) ) Recent Results (from the past 240 hour(s))  Resp Panel by RT-PCR (Flu A&B, Covid) Nasopharyngeal Swab     Status: None   Collection Time: 08/20/21  8:05 PM   Specimen: Nasopharyngeal Swab; Nasopharyngeal(NP) swabs in vial transport medium  Result Value Ref Range Status   SARS Coronavirus 2 by RT PCR NEGATIVE NEGATIVE Final    Comment: (NOTE) SARS-CoV-2 target nucleic acids are NOT DETECTED.  The SARS-CoV-2 RNA is generally detectable in upper respiratory specimens during the acute phase of infection. The lowest concentration of SARS-CoV-2 viral copies this assay can detect is 138 copies/mL. A negative result does not preclude SARS-Cov-2 infection and should not be used as the sole basis for treatment or other patient management decisions. A negative result may occur with  improper specimen collection/handling, submission of specimen other than nasopharyngeal swab, presence of viral mutation(s) within the areas targeted by this assay, and inadequate number of viral copies(<138 copies/mL). A negative result must be combined with clinical observations, patient history, and  epidemiological information. The expected result is Negative.  Fact Sheet for Patients:  EntrepreneurPulse.com.au  Fact Sheet for Healthcare Providers:  IncredibleEmployment.be  This test is no t yet  approved or cleared by the [place] FDA and  has been authorized for detection and/or diagnosis of SARS-CoV-2 by FDA under an Emergency Use Authorization (EUA). This EUA will remain  in effect (meaning this test can be used) for the duration of the COVID-19 declaration under Section 564(b)(1) of the Act, 21 U.S.C.section 360bbb-3(b)(1), unless the authorization is terminated  or revoked sooner.       Influenza A by PCR NEGATIVE NEGATIVE Final   Influenza B by PCR NEGATIVE NEGATIVE Final    Comment: (NOTE) The Xpert Xpress SARS-CoV-2/FLU/RSV plus assay is intended as an aid in the diagnosis of influenza from Nasopharyngeal swab specimens and should not be used as a sole basis for treatment. Nasal washings and aspirates are unacceptable for Xpert Xpress SARS-CoV-2/FLU/RSV testing.  Fact Sheet for Patients: EntrepreneurPulse.com.au  Fact Sheet for Healthcare Providers: IncredibleEmployment.be  This test is not yet approved or cleared by the [place] FDA and has been authorized for detection and/or diagnosis of SARS-CoV-2 by FDA under an Emergency Use Authorization (EUA). This EUA will remain in effect (meaning this test can be used) for the duration of the COVID-19 declaration under Section 564(b)(1) of the Act, 21 U.S.C. section 360bbb-3(b)(1), unless the authorization is terminated or revoked.  Performed at [facility], [place] [address] [place], [place] [address]      Radiological Exams on Admission: DG Chest 2 View  Result Date: 08/20/2021 CLINICAL DATA:  Pulmonary edema, follow-up EXAM: CHEST - 2 VIEW COMPARISON:  08/18/2021 FINDINGS: Bilateral airspace disease again noted, similar to a prior study. Small bilateral pleural effusions. Mild cardiomegaly. Aortic atherosclerosis. No acute bony abnormality. IMPRESSION: Cardiomegaly with bilateral airspace disease which could reflect edema or infection. Small bilateral effusions.  Findings similar to prior study. Electronically Signed   By: Rolm Baptise M.D.   On: 08/20/2021 18:34   DG Chest Portable 1 View  Result Date: 08/20/2021 CLINICAL DATA:  Show shortness of breath EXAM: PORTABLE CHEST 1 VIEW COMPARISON:  08/20/2021 FINDINGS: Diffuse bilateral airspace disease, worsening since prior study. Cardiomegaly. Suspect small bilateral effusions. Aortic atherosclerosis. No acute bony abnormality. IMPRESSION: Worsening diffuse bilateral airspace disease and small bilateral effusions. Findings concerning for edema/CHF. Pneumonia not excluded. Electronically Signed   By: Rolm Baptise M.D.   On: 08/20/2021 18:33    EKG: Independently reviewed. Accelerated junctional rhythm.   Assessment/Plan   1. Acute on chronic diastolic CHF; acute on chronic hypoxic & hypercarbic respiratory failure  - Presents with progressive SOB, fatigue, and somnolence and found to be hypoxic and hypercarbic with elevated BNP and pulmonary edema on CXR  - EF was 60-65% in May 2022  - She was given IV Lasix and started on BiPAP in ED  - Continue diuresis with 40 mg IV Lasix q12h, monitor daily wt and I/Os, monitor renal function and electrolytes   2. Acute encephalopathy  - Presents with progressive somnolence  - Secondary to hypercarbia  - Continue BiPAP, repeat ABG, expand workup if fails to resolve as expected with treatment of hypercarbia   ADDENDUM:  Pt reassessed. Mental status has improved significantly, is now awake and able to provide additional hx, reports recent SOB, denies chest pain, and reports dysuria. Plan to cancel the repeat ABG, continue BiPAP overnight, continue Rocephin, follow urine culture.    3. CKD  IV  - SCr is 2.34 on admission, similar to priors  - Renally-dose medications, monitor closely while diuresing    4. Insulin-dependent DM  - A1c was 6.8% in September 2022  - Continue CBG checks and insulin   5. Depression, anxiety  - Continue Zoloft    6. Hypothyroidism   - Continue Synthroid   7. ITP; IDA   - Follows with hematology and managed with Nplate and IV iron  - Hgb stable at 10.7 and platelets 163k on admission    8. PAF  - Not anticoagulated d/t hx of GIB   9. OSA; chronic hypoxic & hypercarbic respiratory failure  - On BiPAP for now   10. Hypertensive urgency  - Started on NTG infusion briefly in ED but BP improved with diuresis and has been normal off of NTG  - Continue Norvasc   ADDENDUM:  SBP 90s to low 100s with MAP <60 overnight. She was given 250 mL bolus and Norvasc will be held.    11. Elevated troponin  - Troponin mildly elevated in ED  - Patient has not been complaining of chest pain in ED, troponin has not significantly increased, and elevation likely related to acute CHF and acute respiratory failure rather than ACS    DVT prophylaxis: sq heparin  Code Status: DNR, confirmed with daughter on admission  Level of Care: Level of care: Progressive Family Communication: Daughter at bedside  Disposition Plan:  Patient is from: home  Anticipated d/c is to: TBD Anticipated d/c date is: 08/24/21 Patient currently: Pending improvement in respiratory status  Consults called: none  Admission status: Inpatient     Vianne Bulls, MD [facility]  08/20/2021, 10:41 PM

## 2021-08-20 NOTE — ED Triage Notes (Signed)
Pt from home via [facility], currently being treated for pleural effusion. Went to doc for rpt bloodwork, BUN elevated. Reports increased SHOB & weakness. 5L w EMS, not on O2 until PE, 3-4L since. No meds PTA  200/70 HR 64 RR 28

## 2021-08-20 NOTE — Patient Instructions (Signed)
Continue with the torsemide 60mg  daily for next 24 hours.

## 2021-08-20 NOTE — ED Notes (Signed)
Pt O2 sat 89% while on 6L. Pt switched to non-rebreather mask O2 92%. RT notified and will assess pt.

## 2021-08-20 NOTE — ED Provider Notes (Signed)
Emergency Medicine Provider Triage Evaluation Note  [name] , a 86 y.o. female  was evaluated in triage.  Pt complains of shortness of breath.  She is reportedly being treated for pleural effusion, went to doctor for routine blood work, she was found to have an elevated BUN, her troponin was checked, for what appears to be the first time, and was slightly elevated.  She normally is on 3 to 4 L   Review of Systems  Positive:  Negative:   Physical Exam  BP (!) 215/61 (BP Location: Right Arm)    Pulse 72    Temp 97.7 F (36.5 C) (Oral)    Resp 15    LMP  (LMP Unknown)    SpO2 (!) 86%  Gen:   Awake, appears tired Resp:  Increased effort, appears tired.  MSK:   Moves extremities without difficulty  Other:  Patient appears unwell.   Medical Decision Making  Medically screening exam initiated at 6:03 PM.  Appropriate orders placed.  [name] was informed that the remainder of the evaluation will be completed by another provider, this initial triage assessment does not replace that evaluation, and the importance of remaining in the ED until their evaluation is complete.  Patient appears unwell.  She appears tired with significant increased work of breathing and is requiring 6 L nasal cannula. She will be taken back to a treatment room.     Lorin Glass, PA-C 08/20/21 1807    Charlesetta Shanks, MD 08/20/21 2050

## 2021-08-20 NOTE — ED Provider Notes (Signed)
[facility] EMERGENCY DEPARTMENT Provider Note   CSN: [id number] Arrival date & time: 08/20/21  1622     History Chief Complaint  Patient presents with   Shortness of Breath    [name] is a 86 y.o. female.  HPI Patient has history of congestive heart failure.  She has had increased difficulty breathing over the past week.  She has been seen by pulmonology, oncology and her family physician this week patient was advised to increase her torsemide to 60 mg daily.  Patient has had 7 pounds weight loss.  When seen in the PCP office this afternoon, breathing was somewhat better.  Since getting home however it got much worse and patient had to increase her oxygen up to 6 L.  Sinus 3 L.  Patient chronically has swelling in the legs.  She wears compression hose and supports.  No fever or productive cough.  Patient is denying chest pain.  She has been sleeping upright.  Patient had an iron infusion on Thursday.    Past Medical History:  Diagnosis Date   Allergic rhinitis    Anxiety state, unspecified    panic attacks   CHF (congestive heart failure) ([place])    Depressive disorder, not elsewhere classified    Disorientation, unspecified 08/07/2019   Epistaxis 03/24/2018   Extrinsic asthma, unspecified    no problem since adulthood   History of complications due to general anesthesia 03/03/2021   History of pulmonary embolism 08/07/2019   Hypertensive urgency 01/30/2021   Obesity    OSA on CPAP    severe   Pneumonitis 07/14/2019   Pressure injury of skin 07/10/2019   Pulmonary embolism ([place])    Pure hypercholesterolemia    Respiratory failure with hypoxia ([place]) 09/2008   acute, secondary to multiple bilateral pulmonary embolism , negative hypercoagulable workup 09/2008 hospital stay   Scoliosis    Type II or unspecified type diabetes mellitus without mention of complication, not stated as uncontrolled    Unspecified hypothyroidism    hypo    Patient Active  Problem List   Diagnosis Date Noted   Acute on chronic diastolic (congestive) heart failure ([place]) 03/14/2021   History of anxiety 03/03/2021   History of blood clotting disorder 03/03/2021   History of complications due to general anesthesia 03/03/2021   Acute on chronic diastolic CHF (congestive heart failure) ([place]) 01/30/2021   Lateral epicondylitis of right elbow 08/28/2020   Pain in joint of right elbow 07/15/2020   Calculus of gallbladder without cholecystitis without obstruction 09/28/2019   Infection and inflammatory reaction due to indwelling urethral catheter, subsequent encounter 09/28/2019   Nondisplaced fracture of lateral malleolus of right fibula, subsequent encounter for closed fracture with routine healing 09/28/2019   Hemorrhage of anus and rectum 08/07/2019   Unspecified protein-calorie malnutrition ([place]) 08/07/2019   Difficulty in walking, not elsewhere classified 07/18/2019   Dysphagia, oropharyngeal phase 07/18/2019   Chronic respiratory failure with hypoxia ([place]) 07/03/2019   Atypical atrial flutter (HCC)    Paroxysmal atrial fibrillation ([place]) 05/24/2018   Blister (nonthermal), unspecified lower leg, initial encounter 03/24/2018   Cellulitis, unspecified 03/24/2018   Diarrhea, unspecified 03/24/2018   Immune thrombocytopenic purpura ([place]) 03/24/2018   Major depressive disorder, recurrent, mild ([place]) 03/24/2018   Nausea with vomiting, unspecified 03/24/2018   Atherosclerotic heart disease of native coronary artery without angina pectoris 01/13/2018   Candidal vulvovaginitis 01/13/2018   Dental caries, unspecified 01/13/2018   Hypertensive heart and chronic kidney disease with heart failure  and stage 1 through stage 4 chronic kidney disease, or unspecified chronic kidney disease ([place]) 01/13/2018   Long term (current) use of insulin ([place]) 01/13/2018   Muscle weakness (generalized) 01/13/2018   Other adverse food reactions, not elsewhere classified, initial encounter  01/13/2018   Other chronic sinusitis 01/13/2018   Postprocedural hypothyroidism 01/13/2018   Unsteadiness on feet 01/13/2018   Urinary tract infection, site not specified 01/13/2018   Normocytic anemia 05/18/2017   Chronic ITP (idiopathic thrombocytopenia) ([place]) 01/25/2017   Hypercalciuria 01/15/2016   Hyperoxaluria 01/15/2016   Chronic kidney disease (CKD) stage G4/A1, severely decreased glomerular filtration rate (GFR) between 15-29 mL/min/1.73 square meter and albuminuria creatinine ratio less than 30 mg/g (HCC) 09/04/2015   Acute on chronic respiratory failure with hypoxia (HCC) 08/24/2015   Decreased urine volume 07/10/2015   Hypocitraturia 07/10/2015   Physical deconditioning 10/11/2014   Latex allergy, contact dermatitis 10/09/2014   Primary osteoarthritis of left knee 09/17/2014   Clostridium difficile carrier 06/07/2014   Constipation, unspecified 06/07/2012   Nutritional deficiency, unspecified 06/07/2012   Tachycardia 06/07/2012   Dyslipidemia 02/12/2012   GERD (gastroesophageal reflux disease) 02/12/2012   DM type 2 (diabetes mellitus, type 2) ([place]) 08/05/2010   GOUT 06/23/2010   DERMATITIS 01/16/2010   BACK PAIN 06/13/2009   MYALGIA 06/13/2009   Anxiety state 12/13/2008   LEG EDEMA, CHRONIC 11/08/2008   Sleep apnea 10/26/2008   Hypothyroidism 09/27/2008   HYPERCHOLESTEROLEMIA 09/27/2008   OBESITY 09/27/2008   Depression, recurrent ([place]) 09/27/2008   Essential hypertension 09/27/2008   Allergic rhinitis 09/27/2008   ASTHMA, CHILDHOOD 09/27/2008    Past Surgical History:  Procedure Laterality Date   CARDIOVERSION N/A 06/26/2019   Procedure: CARDIOVERSION;  Surgeon: Jerline Pain, MD;  Location: [facility];  Service: Cardiovascular;  Laterality: N/A;   EYE SURGERY Bilateral 2005   cataracts, implants   KIDNEY STONE SURGERY Bilateral    2016, 2017    knee replaced Left 2013   THYROID SURGERY  1966   nodule removal; partial thyroidectomy; radioactive iodine  x2; 1990's     OB History   No obstetric history on file.     Family History  Problem Relation Age of Onset   Hypertension Father    Hyperlipidemia Father    Allergies Mother    Clotting disorder Mother    Osteoarthritis Mother    Asthma Mother    Hypertension Mother    Stroke Mother    Coronary artery disease Other    Stroke Other    Rheum arthritis Maternal Grandmother    Clotting disorder Maternal Grandmother    Clotting disorder Maternal Uncle    Arthritis Daughter    Diabetes Daughter    High blood pressure Daughter    High Cholesterol Daughter    Depression Maternal Grandfather    Diabetes Paternal Grandmother    Breast cancer Neg Hx     Social History   Tobacco Use   Smoking status: Former    Packs/day: 0.25    Years: 20.00    Pack years: 5.00    Types: Cigarettes    Start date: 68    Quit date: 09/07/1989    Years since quitting: 31.9   Smokeless tobacco: Never  Vaping Use   Vaping Use: Never used  Substance Use Topics   Alcohol use: No   Drug use: No    Home Medications Prior to Admission medications   Medication Sig Start Date End Date Taking? Authorizing Provider  acetaminophen (TYLENOL) 325 MG tablet  Take 650 mg by mouth in the morning and at bedtime.    [provider]  amLODipine (NORVASC) 2.5 MG tablet Take 3 tablets (7.5 mg total) by mouth daily. 02/18/21   Caren Macadam, MD  amoxicillin (AMOXIL) 500 MG tablet Take 2,000 mg by mouth as needed. Prior to Dental Appt 02/18/21   [provider]  atorvastatin (LIPITOR) 40 MG tablet TAKE 1 TABLET(40 MG) BY MOUTH AT BEDTIME 05/20/21   Koberlein, Steele Berg, MD  BD PEN NEEDLE NANO 2ND GEN 32G X 4 MM MISC USE TO INJECT UP TO FOUR TIMES DAILY AS DIRECTED 02/27/21   Philemon Kingdom, MD  bisacodyl (DULCOLAX) 5 MG EC tablet Take 10 mg by mouth every 3 (three) days as needed (for constipation).    [provider]  Bismuth Tribromoph-Petrolatum (XEROFORM PETROLAT PATCH 4"X4")  PADS Apply 1 Units topically daily as needed. 11/08/20   Caren Macadam, MD  Cholecalciferol (VITAMIN D3) 2000 units TABS Take 5,000 Units by mouth daily.    [provider]  Continuous Blood Gluc Sensor (FREESTYLE LIBRE 14 DAY SENSOR) MISC APPLY NEW SENSOR EVERY 14 DAYS AS DIRECTED 12/16/20   Philemon Kingdom, MD  Dulaglutide (TRULICITY) 1.27 NT/7.0YF SOPN Inject 0.75 mg into the skin once a week. 05/26/21   Philemon Kingdom, MD  fexofenadine (ALLEGRA) 180 MG tablet Take 180 mg by mouth daily. 01/04/20   [provider]  hyoscyamine (LEVSIN) 0.125 MG tablet Take 1 tablet by mouth daily. 01/06/20   [provider]  insulin glargine (LANTUS SOLOSTAR) 100 UNIT/ML Solostar Pen Inject 46 Units into the skin at bedtime. 08/20/21   Koberlein, Steele Berg, MD  insulin lispro (HUMALOG KWIKPEN) 100 UNIT/ML KwikPen INJECT 13 TO 20 UNITS UNDER THE SKIN THREE TIMES DAILY WITH BREAKFAST, LUNCH AND EVENING MEAL 08/18/21   Philemon Kingdom, MD  Insulin Syringe-Needle U-100 (INSULIN SYRINGE .3CC/29GX1/2") 29G X 1/2" 0.3 ML MISC Use to inject insulin 4 times a day. 01/02/20   Philemon Kingdom, MD  levothyroxine (SYNTHROID) 112 MCG tablet TAKE 1 TABLET(112 MCG) BY MOUTH DAILY 04/21/21   Philemon Kingdom, MD  Multiple Vitamin (MULTIVITAMIN) tablet Take 1 tablet by mouth daily.    [provider]  sertraline (ZOLOFT) 50 MG tablet TAKE 1 TABLET BY MOUTH AT BEDTIME Patient taking differently: 100 mg. Take 2 daily 07/19/21   Caren Macadam, MD  torsemide (DEMADEX) 20 MG tablet Take 40 mg by mouth every morning. 04/25/21   [provider]  vitamin B-12 (CYANOCOBALAMIN) 100 MCG tablet Take 100 mcg by mouth daily.    [provider]  vitamin C (ASCORBIC ACID) 250 MG tablet Take 250 mg by mouth daily.    [provider]    Allergies    Adhesive [tape], Apixaban, Aspirin, Mirabegron, Pineapple, Metformin, Tetracycline, Fluticasone-salmeterol, Iodinated  diagnostic agents, Lactose intolerance (gi), Latex, Lisinopril, Metronidazole, Sulfa antibiotics, and Sulfonamide derivatives  Review of Systems   Review of Systems 10 systems reviewed and negative except as per HPI Physical Exam Updated Vital Signs BP (!) 130/49    Pulse (!) 50    Temp 97.7 F (36.5 C) (Oral)    Resp 20    Ht 5\' 1"  (1.549 m)    Wt 100.2 kg    LMP  (LMP Unknown)    SpO2 95%    BMI 41.76 kg/m   Physical Exam Constitutional:      Comments: Patient has moderate to significant increased work of breathing at rest.  She is answering  questions with short one-word sentences.  Significant central obesity  HENT:     Head: Normocephalic and atraumatic.     Mouth/Throat:     Pharynx: Oropharynx is clear.  Eyes:     Extraocular Movements: Extraocular movements intact.  Neck:     Comments: Positive JVD. Cardiovascular:     Rate and Rhythm: Normal rate.  Pulmonary:     Comments: Moderate to significant increased work of breathing.  Patient has a nonrebreather mask at 6 L.  Oxygen saturations at 93%.  Crackles throughout lung fields. Abdominal:     General: There is no distension.     Palpations: Abdomen is soft.     Tenderness: There is no abdominal tenderness. There is no guarding.  Musculoskeletal:     Comments: Patient has compression hose and compression splints both lower legs.  Edema is appreciable above the splints and compression.  Skin:    General: Skin is warm and dry.  Neurological:     Comments: Patient seems fatigued.  She is answering questions in 1 or 2 word sentences.  No evident focal motor deficits.     ED Results / Procedures / Treatments   Labs (all labs ordered are listed, but only abnormal results are displayed) Labs Reviewed  COMPREHENSIVE METABOLIC PANEL - Abnormal; Notable for the following components:      Result Value   Glucose, Bld 209 (*)    BUN 71 (*)    Creatinine, Ser 2.34 (*)    GFR, Estimated 20 (*)    All other components within  normal limits  CBC WITH DIFFERENTIAL/PLATELET - Abnormal; Notable for the following components:   RBC 3.46 (*)    Hemoglobin 10.7 (*)    HCT 35.6 (*)    MCV 102.9 (*)    RDW 16.3 (*)    Neutro Abs 8.6 (*)    Abs Immature Granulocytes 0.10 (*)    All other components within normal limits  URINALYSIS, ROUTINE W REFLEX MICROSCOPIC - Abnormal; Notable for the following components:   APPearance HAZY (*)    Hgb urine dipstick TRACE (*)    Protein, ur 100 (*)    Leukocytes,Ua SMALL (*)    All other components within normal limits  URINALYSIS, MICROSCOPIC (REFLEX) - Abnormal; Notable for the following components:   Bacteria, UA MANY (*)    All other components within normal limits  I-STAT VENOUS BLOOD GAS, ED - Abnormal; Notable for the following components:   pCO2, Ven 80.2 (*)    pO2, Ven 217.0 (*)    Bicarbonate 36.6 (*)    TCO2 39 (*)    Acid-Base Excess 7.0 (*)    HCT 33.0 (*)    Hemoglobin 11.2 (*)    All other components within normal limits  TROPONIN I (HIGH SENSITIVITY) - Abnormal; Notable for the following components:   Troponin I (High Sensitivity) 31 (*)    All other components within normal limits  RESP PANEL BY RT-PCR (FLU A&B, COVID) ARPGX2  URINE CULTURE  PROTIME-INR  LACTIC ACID, PLASMA  BRAIN NATRIURETIC PEPTIDE  LACTIC ACID, PLASMA  TROPONIN I (HIGH SENSITIVITY)    EKG EKG Interpretation  Date/Time:  Wednesday August 20 2021 17:43:23 EST Ventricular Rate:  71 PR Interval:    QRS Duration: 90 QT Interval:  430 QTC Calculation: 467 R Axis:   84 Text Interpretation: Accelerated Junctional rhythm Low voltage QRS Abnormal ECG no sig change from previous Confirmed by Charlesetta Shanks [phone]) on 08/20/2021 6:48:25 PM  Radiology  DG Chest 2 View  Result Date: 08/20/2021 CLINICAL DATA:  Pulmonary edema, follow-up EXAM: CHEST - 2 VIEW COMPARISON:  08/18/2021 FINDINGS: Bilateral airspace disease again noted, similar to a prior study. Small bilateral pleural  effusions. Mild cardiomegaly. Aortic atherosclerosis. No acute bony abnormality. IMPRESSION: Cardiomegaly with bilateral airspace disease which could reflect edema or infection. Small bilateral effusions. Findings similar to prior study. Electronically Signed   By: Rolm Baptise M.D.   On: 08/20/2021 18:34   DG Chest Portable 1 View  Result Date: 08/20/2021 CLINICAL DATA:  Show shortness of breath EXAM: PORTABLE CHEST 1 VIEW COMPARISON:  08/20/2021 FINDINGS: Diffuse bilateral airspace disease, worsening since prior study. Cardiomegaly. Suspect small bilateral effusions. Aortic atherosclerosis. No acute bony abnormality. IMPRESSION: Worsening diffuse bilateral airspace disease and small bilateral effusions. Findings concerning for edema/CHF. Pneumonia not excluded. Electronically Signed   By: Rolm Baptise M.D.   On: 08/20/2021 18:33    Procedures Procedures  CRITICAL CARE Performed by: Charlesetta Shanks   Total critical care time: 45 minutes  Critical care time was exclusive of separately billable procedures and treating other patients.  Critical care was necessary to treat or prevent imminent or life-threatening deterioration.  Critical care was time spent personally by me on the following activities: development of treatment plan with patient and/or surrogate as well as nursing, discussions with consultants, evaluation of patient's response to treatment, examination of patient, obtaining history from patient or surrogate, ordering and performing treatments and interventions, ordering and review of laboratory studies, ordering and review of radiographic studies, pulse oximetry and re-evaluation of patient's condition.  Medications Ordered in ED Medications  nitroGLYCERIN 50 mg in dextrose 5 % 250 mL (0.2 mg/mL) infusion (0 mcg/min Intravenous Paused 08/20/21 1907)  cefTRIAXone (ROCEPHIN) 1 g in sodium chloride 0.9 % 100 mL IVPB (1 g Intravenous New Bag/Given 08/20/21 2024)  furosemide (LASIX)  injection 60 mg (60 mg Intravenous Given 08/20/21 1849)    ED Course  I have reviewed the triage vital signs and the nursing notes.  Pertinent labs & imaging results that were available during my care of the patient were reviewed by me and considered in my medical decision making (see chart for details).  Clinical Course as of 08/20/21 2038  Wed Aug 20, 2021  2037 Call: Dr. Myna Hidalgo for admission. [MP]    Clinical Course User Index [MP] Charlesetta Shanks, MD   MDM Rules/Calculators/A&P                           Patient presents with acute respiratory distress.  Chest x-ray is consistent with pulmonary edema and exam is for crackles throughout lung fields.  Patient has known history of congestive heart failure.  At this time will initiate BiPAP, Lasix and nitro glycerin drip for hypertensive emergency.  Venous gas also suggest hypercapnia likely secondary to central obesity hypoventilation syndrome.  Patient does not have history of COPD.  Patient is responding well to BiPAP.  She is drowsy but with stimulus able to awaken and orient to place and answer simple questions with the BiPAP on.  Urinalysis shows signs of infection.  Patient started on Rocephin for UTI.  Consult with Dr. Myna Hidalgo for admission. Final Clinical Impression(s) / ED Diagnoses Final diagnoses:  Hypertensive emergency  Acute on chronic congestive heart failure, unspecified heart failure type ([place])  Acute on chronic respiratory failure with hypoxia and hypercapnia ([place])    Rx / DC Orders ED Discharge Orders  None        Charlesetta Shanks, MD 08/20/21 2040

## 2021-08-20 NOTE — Telephone Encounter (Signed)
CRITICAL VALUE STICKER  CRITICAL VALUE: High troponin @19   RECEIVER (on-site recipient of call): Laveda Norman x-ray tech  DATE & TIME NOTIFIED: 08/20/2021 at 3:02pm  MESSENGER (representative from lab): Abigail from the lab  MD NOTIFIED:Dr Koberlein  TIME OF NOTIFICATION:3:02pm-urgent message sent via EPIC  RESPONSE:

## 2021-08-20 NOTE — Telephone Encounter (Signed)
She is not going to like this, but she needs to go to ER for further evaluation. She is really not feeling well and it may be that extra fluid has put some strain on heart; troponin is elevated which indicated more acute injury to heart. With this lab, she needs to go to [facility]. If daughter not with her she should go by EMS. If daughter can take her then please call ahead so they are aware that she is coming in with elevated troponin and needs urgent eval.

## 2021-08-20 NOTE — ED Notes (Signed)
Pt on 6L [place], O2 noted to be 86%; charge RN notified, pt roomed immediately

## 2021-08-20 NOTE — ED Notes (Signed)
Patient oxygen saturation dropped to 87-81% on 30% Fio2. Patient Bipap increased to 40% after speaking with RT. Patient saturation at 96%. Daughter at bedside

## 2021-08-21 ENCOUNTER — Encounter: Payer: Medicare Other | Admitting: Physical Therapy

## 2021-08-21 ENCOUNTER — Inpatient Hospital Stay (HOSPITAL_COMMUNITY): Payer: Medicare Other

## 2021-08-21 ENCOUNTER — Other Ambulatory Visit: Payer: Self-pay

## 2021-08-21 DIAGNOSIS — N179 Acute kidney failure, unspecified: Secondary | ICD-10-CM | POA: Insufficient documentation

## 2021-08-21 DIAGNOSIS — I5033 Acute on chronic diastolic (congestive) heart failure: Secondary | ICD-10-CM

## 2021-08-21 DIAGNOSIS — J9622 Acute and chronic respiratory failure with hypercapnia: Secondary | ICD-10-CM

## 2021-08-21 DIAGNOSIS — J9621 Acute and chronic respiratory failure with hypoxia: Secondary | ICD-10-CM

## 2021-08-21 DIAGNOSIS — R778 Other specified abnormalities of plasma proteins: Secondary | ICD-10-CM | POA: Diagnosis present

## 2021-08-21 LAB — I-STAT ARTERIAL BLOOD GAS, ED
Acid-Base Excess: 8 mmol/L — ABNORMAL HIGH (ref 0.0–2.0)
Acid-Base Excess: 9 mmol/L — ABNORMAL HIGH (ref 0.0–2.0)
Bicarbonate: 35.4 mmol/L — ABNORMAL HIGH (ref 20.0–28.0)
Bicarbonate: 36.3 mmol/L — ABNORMAL HIGH (ref 20.0–28.0)
Calcium, Ion: 1.25 mmol/L (ref 1.15–1.40)
Calcium, Ion: 1.27 mmol/L (ref 1.15–1.40)
HCT: 28 % — ABNORMAL LOW (ref 36.0–46.0)
HCT: 32 % — ABNORMAL LOW (ref 36.0–46.0)
Hemoglobin: 10.9 g/dL — ABNORMAL LOW (ref 12.0–15.0)
Hemoglobin: 9.5 g/dL — ABNORMAL LOW (ref 12.0–15.0)
O2 Saturation: 91 %
O2 Saturation: 93 %
Patient temperature: 97.7
Patient temperature: 98.3
Potassium: 4.1 mmol/L (ref 3.5–5.1)
Potassium: 4.4 mmol/L (ref 3.5–5.1)
Sodium: 143 mmol/L (ref 135–145)
Sodium: 145 mmol/L (ref 135–145)
TCO2: 37 mmol/L — ABNORMAL HIGH (ref 22–32)
TCO2: 38 mmol/L — ABNORMAL HIGH (ref 22–32)
pCO2 arterial: 58.4 mmHg — ABNORMAL HIGH (ref 32.0–48.0)
pCO2 arterial: 70.2 mmHg (ref 32.0–48.0)
pH, Arterial: 7.319 — ABNORMAL LOW (ref 7.350–7.450)
pH, Arterial: 7.39 (ref 7.350–7.450)
pO2, Arterial: 64 mmHg — ABNORMAL LOW (ref 83.0–108.0)
pO2, Arterial: 76 mmHg — ABNORMAL LOW (ref 83.0–108.0)

## 2021-08-21 LAB — CBG MONITORING, ED
Glucose-Capillary: 100 mg/dL — ABNORMAL HIGH (ref 70–99)
Glucose-Capillary: 87 mg/dL (ref 70–99)
Glucose-Capillary: 82 mg/dL (ref 70–99)
Glucose-Capillary: 74 mg/dL (ref 70–99)
Glucose-Capillary: 101 mg/dL — ABNORMAL HIGH (ref 70–99)

## 2021-08-21 LAB — BASIC METABOLIC PANEL
Anion gap: 9 (ref 5–15)
BUN: 74 mg/dL — ABNORMAL HIGH (ref 8–23)
CO2: 33 mmol/L — ABNORMAL HIGH (ref 22–32)
Calcium: 8.9 mg/dL (ref 8.9–10.3)
Chloride: 101 mmol/L (ref 98–111)
Creatinine, Ser: 2.73 mg/dL — ABNORMAL HIGH (ref 0.44–1.00)
GFR, Estimated: 17 mL/min — ABNORMAL LOW (ref >60)
Glucose, Bld: 105 mg/dL — ABNORMAL HIGH (ref 70–99)
Potassium: 4.2 mmol/L (ref 3.5–5.1)
Sodium: 143 mmol/L (ref 135–145)

## 2021-08-21 LAB — CBC
HCT: 29.6 % — ABNORMAL LOW (ref 36.0–46.0)
Hemoglobin: 9.2 g/dL — ABNORMAL LOW (ref 12.0–15.0)
MCH: 31 pg (ref 26.0–34.0)
MCHC: 31.1 g/dL (ref 30.0–36.0)
MCV: 99.7 fL (ref 80.0–100.0)
Platelets: 128 10*3/uL — ABNORMAL LOW (ref 150–400)
RBC: 2.97 MIL/uL — ABNORMAL LOW (ref 3.87–5.11)
RDW: 16.3 % — ABNORMAL HIGH (ref 11.5–15.5)
WBC: 6.4 10*3/uL (ref 4.0–10.5)
nRBC: 0 % (ref 0.0–0.2)

## 2021-08-21 LAB — HEMOGLOBIN A1C
Hgb A1c MFr Bld: 6 % — ABNORMAL HIGH (ref 4.8–5.6)
Mean Plasma Glucose: 125.5 mg/dL

## 2021-08-21 LAB — GLUCOSE, CAPILLARY
Glucose-Capillary: 122 mg/dL — ABNORMAL HIGH (ref 70–99)
Glucose-Capillary: 133 mg/dL — ABNORMAL HIGH (ref 70–99)

## 2021-08-21 LAB — TROPONIN I (HIGH SENSITIVITY)
Troponin I (High Sensitivity): 69 ng/L — ABNORMAL HIGH (ref <18)
Troponin I (High Sensitivity): 59 ng/L — ABNORMAL HIGH (ref <18)

## 2021-08-21 LAB — MAGNESIUM: Magnesium: 2.1 mg/dL (ref 1.7–2.4)

## 2021-08-21 MED ORDER — DEXTROSE 10 % IV SOLN: INTRAVENOUS | Status: DC

## 2021-08-21 MED ORDER — SODIUM CHLORIDE 0.9 % IV BOLUS
250.0000 mL | Freq: Once | INTRAVENOUS | Status: AC
  Administered 2021-08-21: 250 mL via INTRAVENOUS

## 2021-08-21 MED ORDER — SODIUM CHLORIDE 0.9 % IV SOLN
1.0000 g | INTRAVENOUS | Status: AC
  Administered 2021-08-21 – 2021-08-24 (×4): 1 g via INTRAVENOUS
  Filled 2021-08-21 (×4): qty 10

## 2021-08-21 MED ORDER — CHLORHEXIDINE GLUCONATE CLOTH 2 % EX PADS
6.0000 | MEDICATED_PAD | Freq: Every day | CUTANEOUS | Status: DC
  Administered 2021-08-21 – 2021-08-23 (×3): 6 via TOPICAL

## 2021-08-21 MED ORDER — HYOSCYAMINE SULFATE 0.125 MG SL SUBL
0.1250 mg | SUBLINGUAL_TABLET | Freq: Every day | SUBLINGUAL | Status: DC
  Administered 2021-08-23 – 2021-08-27 (×5): 0.125 mg via ORAL
  Filled 2021-08-21 (×7): qty 1

## 2021-08-21 NOTE — ED Notes (Signed)
The pt sleeping on the bi-pap daughter at  the bedside

## 2021-08-21 NOTE — Assessment & Plan Note (Signed)
Continue Zoloft as at home.

## 2021-08-21 NOTE — Assessment & Plan Note (Addendum)
Pt uses CPAP at home, but states that she likes BIPAP better. Perhaps can get BIPAP for home use.

## 2021-08-21 NOTE — Assessment & Plan Note (Addendum)
Platelets are stable at 134. They have ranged from 107 to 140 in the past 60 days. NPlate was not given on her most recent visit to Oncology, although she did receive an iron infusion or on admission. Monitor.

## 2021-08-21 NOTE — Progress Notes (Signed)
Patient taken off bipap and placed on 6L [place] per MD. Vitals are stable on this time. RT will monitor.

## 2021-08-21 NOTE — ED Notes (Signed)
Admitting paged to RN per her request 

## 2021-08-21 NOTE — ED Notes (Signed)
Pt's daughter states that pt has been having trouble swallowing lately. She states they have been having to crush her pills. She also states pt has not ate or drank in a day or so.

## 2021-08-21 NOTE — ED Notes (Signed)
Patient transported to Ultrasound 

## 2021-08-21 NOTE — Assessment & Plan Note (Addendum)
Urine has grown out klebsiella pneumoniae that is sensitive to Rocephin. The patient has completed her course of antibiotics.

## 2021-08-21 NOTE — Assessment & Plan Note (Addendum)
Noted. The patient has been evaluated by PT/OT. Their recommendation is for discharge to SNF for rehab.

## 2021-08-21 NOTE — Progress Notes (Signed)
Patient transported on BIPAP from ED Room 3 to ED Room 15 with no complications noted.

## 2021-08-21 NOTE — Consult Note (Addendum)
Cardiology Consultation:   Patient ID: [name] MRN: [id number]; DOB: [birth year]  Admit date: 08/20/2021 Date of Consult: 08/21/2021  PCP:  Caren Macadam, MD   [facility] Providers Cardiologist:  Buford Dresser, MD  Electrophysiologist:  Thompson Grayer, MD       Patient Profile:   [name] is a 86 y.o. female with a hx of hronic diastolic heart failure, PAF, chronic thrombocytopenia followed by Dr. Marin Olp, HTN, HLD, DM II, chronic respiratory failure on home O2, CKD stage IV, hypothyroidism and OSA not compliant with CPAP who is being seen 08/21/2021 for the evaluation of acute on chronic diastolic heart failure at the request of Dr. Radene Gunning.  History of Present Illness:   [name] is a 86 year old female with past medical history of chronic diastolic heart failure, PAF, chronic thrombocytopenia followed by Dr. Marin Olp, HTN, HLD, DM II, chronic respiratory failure on home O2, CKD stage IV, hypothyroidism and OSA not compliant with CPAP.  Echocardiogram obtained on 01/31/2021 showed EF 60 to 65%, no regional wall motion abnormality, RVSP 31.5 mmHg, moderate LAE, small pericardial effusion, mild MR.  VQ scan obtained on 01/08/2021 was negative for PE.  She was seen in the office in June 2022 for worsening palpitation, metoprolol succinate was added at the time.  Patient was previously admitted for acute CHF exacerbation in July 2022 and was eventually discharged on home O2.  When she was seen by Dr. Harrell Gave for hospital follow-up in August, she was noted to be in junctional bradycardia.  Beta-blocker was discontinued.  A heart monitor was obtained in September 2022, heart rate was stable off of metoprolol.  She has been seen by EP service who mention to her that she may ended up needing a pacemaker if she has further junctional bradycardia despite off of AV nodal blocking agent.  According to family member, she has been taking 40 mg of torsemide for  chronic diastolic heart failure and 7.5 mg daily of amlodipine for her blood pressure.  About 2 weeks ago, she has been having worsening nausea and lack of appetite.  She also began to notice progressive weakness and shortness of breath in the past week.  Family noticed that she has been more somnolent and is sometimes confused.  She went to her Dr. Antonieta Pert office last Thursday and received iron infusion and platelet stimulating injection.  Over the weekend, family noticed she was mainly sleeping and not eating.  She has not been unable to obtain a new CPAP machine therefore has not been using CPAP recently. She went to her pulmonologist office on Monday to see Dr. Elsworth Soho.  Chest x-ray showed small bilateral pleural effusion and bilateral hazy opacities suggestive of pulmonary edema.  Her weight was 225 pounds.  She was instructed to increase torsemide from the previous 40 mg daily to 60 mg daily.  Immediately after starting on the higher dose, she lost 6 pounds however family continues to notice increased confusion and shortness of breath.  She eventually presented to [facility] on 08/20/2021 for further evaluation.  Initial systolic blood pressure was 215.  O2 saturation was 86% on 6 L/min of O2 chest x-ray showed worsening bilateral airspace disease and small bilateral pleural effusion.  Creatinine was 2.34 which is near her baseline.  Initial venous ABG shows pH 7.26, PCO2 80.2.  Repeat creatinine in the morning of 12/15 was 2.73.  proBNP 279.0 (it was 168 in July during her hospitalization for heart failure).  Serial troponin  31--> 37--> 69--> 59.  Patient has been admitted by hospitalist service and started on IV Lasix, IV Rocephin and briefly IV nitroglycerin infusion to help lower the blood pressure.  Talking with the patient, she is not sure if she has missed any doses of amlodipine recently.  She thinks she missed at least 2 days of diuretic last week.  Cardiology service consulted for  possible heart failure   Past Medical History:  Diagnosis Date   Allergic rhinitis    Anxiety state, unspecified    panic attacks   CHF (congestive heart failure) (HCC)    Depressive disorder, not elsewhere classified    Disorientation, unspecified 08/07/2019   Epistaxis 03/24/2018   Extrinsic asthma, unspecified    no problem since adulthood   History of complications due to general anesthesia 03/03/2021   History of pulmonary embolism 08/07/2019   Hypertensive urgency 01/30/2021   Obesity    OSA on CPAP    severe   Pneumonitis 07/14/2019   Pressure injury of skin 07/10/2019   Pulmonary embolism ([place])    Pure hypercholesterolemia    Respiratory failure with hypoxia ([place]) 09/2008   acute, secondary to multiple bilateral pulmonary embolism , negative hypercoagulable workup 09/2008 hospital stay   Scoliosis    Type II or unspecified type diabetes mellitus without mention of complication, not stated as uncontrolled    Unspecified hypothyroidism    hypo    Past Surgical History:  Procedure Laterality Date   CARDIOVERSION N/A 06/26/2019   Procedure: CARDIOVERSION;  Surgeon: Jerline Pain, MD;  Location: [place];  Service: Cardiovascular;  Laterality: N/A;   EYE SURGERY Bilateral 2005   cataracts, implants   KIDNEY STONE SURGERY Bilateral    2016, 2017    knee replaced Left 2013   THYROID SURGERY  1966   nodule removal; partial thyroidectomy; radioactive iodine x2; 1990's     Home Medications:  Prior to Admission medications   Medication Sig Start Date End Date Taking? Authorizing Provider  acetaminophen (TYLENOL) 325 MG tablet Take 650 mg by mouth in the morning and at bedtime.   Yes [provider]  amLODipine (NORVASC) 2.5 MG tablet Take 3 tablets (7.5 mg total) by mouth daily. 02/18/21  Yes Koberlein, Junell C, MD  amoxicillin (AMOXIL) 500 MG tablet Take 2,000 mg by mouth as needed. Prior to Dental Appt 02/18/21  Yes [provider]  atorvastatin  (LIPITOR) 40 MG tablet TAKE 1 TABLET(40 MG) BY MOUTH AT BEDTIME Patient taking differently: Take 40 mg by mouth at bedtime. 05/20/21  Yes Koberlein, Steele Berg, MD  BD PEN NEEDLE NANO 2ND GEN 32G X 4 MM MISC USE TO INJECT UP TO FOUR TIMES DAILY AS DIRECTED Patient taking differently: 1 each by Other route See admin instructions. USE TO INJECT UP TO FOUR TIMES DAILY AS DIRECTED 02/27/21  Yes Philemon Kingdom, MD  bisacodyl (DULCOLAX) 5 MG EC tablet Take 10 mg by mouth every 3 (three) days as needed (for constipation).   Yes [provider]  Bismuth Tribromoph-Petrolatum (XEROFORM PETROLAT PATCH 4"X4") PADS Apply 1 Units topically daily as needed. 11/08/20  Yes Koberlein, Steele Berg, MD  Cholecalciferol (VITAMIN D3) 2000 units TABS Take 5,000 Units by mouth daily.   Yes [provider]  Continuous Blood Gluc Sensor (FREESTYLE LIBRE 14 DAY SENSOR) MISC APPLY NEW SENSOR EVERY 14 DAYS AS DIRECTED Patient taking differently: 1 each by Other route See admin instructions. APPLY NEW SENSOR EVERY 14 DAYS AS DIRECTED 12/16/20  Yes Gherghe,  Salena Saner, MD  Dulaglutide (TRULICITY) 1.61 WR/6.0AV SOPN Inject 0.75 mg into the skin once a week. Patient taking differently: Inject 0.75 mg into the skin once a week. sunday 05/26/21  Yes Philemon Kingdom, MD  fexofenadine (ALLEGRA) 180 MG tablet Take 180 mg by mouth daily. 01/04/20  Yes [provider]  hyoscyamine (LEVSIN) 0.125 MG tablet Take 1 tablet by mouth daily. 01/06/20  Yes [provider]  insulin glargine (LANTUS SOLOSTAR) 100 UNIT/ML Solostar Pen Inject 46 Units into the skin at bedtime. 08/20/21  Yes Koberlein, Junell C, MD  insulin lispro (HUMALOG KWIKPEN) 100 UNIT/ML KwikPen INJECT 13 TO 20 UNITS UNDER THE SKIN THREE TIMES DAILY WITH BREAKFAST, LUNCH AND EVENING MEAL Patient taking differently: Inject 13-20 Units into the skin See admin instructions. INJECT 13 TO 20 UNITS UNDER THE SKIN THREE TIMES DAILY WITH BREAKFAST, LUNCH AND  EVENING MEAL 08/18/21  Yes Philemon Kingdom, MD  Insulin Syringe-Needle U-100 (INSULIN SYRINGE .3CC/29GX1/2") 29G X 1/2" 0.3 ML MISC Use to inject insulin 4 times a day. Patient taking differently: 1 each by Other route See admin instructions. Use to inject insulin 4 times a day. 01/02/20  Yes Philemon Kingdom, MD  levothyroxine (SYNTHROID) 112 MCG tablet TAKE 1 TABLET(112 MCG) BY MOUTH DAILY Patient taking differently: Take 112 mcg by mouth daily before breakfast. 04/21/21  Yes Philemon Kingdom, MD  Multiple Vitamin (MULTIVITAMIN) tablet Take 1 tablet by mouth daily.   Yes [provider]  potassium citrate (UROCIT-K) 10 MEQ (1080 MG) SR tablet Take 20 mEq by mouth 2 (two) times daily. 05/22/21  Yes [provider]  sertraline (ZOLOFT) 50 MG tablet TAKE 1 TABLET BY MOUTH AT BEDTIME Patient taking differently: 100 mg. Take 2 daily 07/19/21  Yes Koberlein, Junell C, MD  torsemide (DEMADEX) 20 MG tablet Take 60 mg by mouth every morning. 04/25/21  Yes [provider]  vitamin B-12 (CYANOCOBALAMIN) 100 MCG tablet Take 100 mcg by mouth daily.   Yes [provider]  vitamin C (ASCORBIC ACID) 250 MG tablet Take 250 mg by mouth daily.   Yes [provider]    Inpatient Medications: Scheduled Meds:  atorvastatin  40 mg Oral Daily   furosemide  40 mg Intravenous Q12H   heparin  5,000 Units Subcutaneous Q8H   hyoscyamine  0.125 mg Oral Daily   insulin aspart  0-9 Units Subcutaneous Q4H   insulin glargine-yfgn  25 Units Subcutaneous QHS   levothyroxine  112 mcg Oral QAC breakfast   sertraline  100 mg Oral QHS   sodium chloride flush  3 mL Intravenous Q12H   Continuous Infusions:  cefTRIAXone (ROCEPHIN)  IV     PRN Meds: acetaminophen **OR** acetaminophen, labetalol, ondansetron **OR** ondansetron (ZOFRAN) IV, senna-docusate  Allergies:    Allergies  Allergen Reactions   Adhesive [Tape] Other (See Comments)    PULLS OFF THE SKIN   Apixaban Other  (See Comments)    Internal Bleeding   Aspirin Itching, Rash, Hives and Swelling    Swelling of her tongue   Mirabegron Other (See Comments)    Patient experienced A-Fib   Pineapple Anaphylaxis and Swelling    Throat swells and blisters on tongue and roof of mouth per patient   Metformin Diarrhea and Nausea Only   Tetracycline Hives   Fluticasone-Salmeterol Itching and Rash   Iodinated Diagnostic Agents Itching and Rash        Lactose Intolerance (Gi) Other (See Comments)    Gas    Latex Itching, Rash  and Other (See Comments)    Pulls off the skin and causes welts   Lisinopril Cough   Metronidazole Other (See Comments)    Reaction not known   Sulfa Antibiotics Rash   Sulfonamide Derivatives Itching and Rash    Social History:   Social History   Socioeconomic History   Marital status: Widowed    Spouse name: Not on file   Number of children: 2   Years of education: Not on file   Highest education level: Not on file  Occupational History   Occupation: OWNER    Employer: [employer]    Comment: Self employed- runs Advertising copywriter  Tobacco Use   Smoking status: Former    Packs/day: 0.25    Years: 20.00    Pack years: 5.00    Types: Cigarettes    Start date: 1958    Quit date: 09/07/1989    Years since quitting: 31.9   Smokeless tobacco: Never  Vaping Use   Vaping Use: Never used  Substance and Sexual Activity   Alcohol use: No   Drug use: No   Sexual activity: Not Currently  Other Topics Concern   Not on file  Social History Narrative   Lives at [place] Strain: Low Risk    Difficulty of Paying Living Expenses: Not hard at all  Food Insecurity: No Food Insecurity   Worried About Charity fundraiser in the Last Year: Never true   Arboriculturist in the Last Year: Never true  Transportation Needs: No Transportation Needs   Lack of Transportation (Medical): No   Lack of Transportation (Non-Medical):  No  Physical Activity: Inactive   Days of Exercise per Week: 0 days   Minutes of Exercise per Session: 0 min  Stress: No Stress Concern Present   Feeling of Stress : Not at all  Social Connections: Socially Isolated   Frequency of Communication with Friends and Family: Three times a week   Frequency of Social Gatherings with Friends and Family: Three times a week   Attends Religious Services: Never   Active Member of Clubs or Organizations: No   Attends Archivist Meetings: Never   Marital Status: Widowed  Human resources officer Violence: Not At Risk   Fear of Current or Ex-Partner: No   Emotionally Abused: No   Physically Abused: No   Sexually Abused: No    Family History:    Family History  Problem Relation Age of Onset   Hypertension Father    Hyperlipidemia Father    Allergies Mother    Clotting disorder Mother    Osteoarthritis Mother    Asthma Mother    Hypertension Mother    Stroke Mother    Coronary artery disease Other    Stroke Other    Rheum arthritis Maternal Grandmother    Clotting disorder Maternal Grandmother    Clotting disorder Maternal Uncle    Arthritis Daughter    Diabetes Daughter    High blood pressure Daughter    High Cholesterol Daughter    Depression Maternal Grandfather    Diabetes Paternal Grandmother    Breast cancer Neg Hx      ROS:  Please see the history of present illness.   All other ROS reviewed and negative.     Physical Exam/Data:   Vitals:   08/21/21 1100 08/21/21 1130 08/21/21 1200 08/21/21 1300  BP: (!) 156/47  (!) 132/49 (!) 148/47  Pulse: (!) 57 62 (!) 56 (!) 59  Resp: 20  19 (!) 22  Temp:      TempSrc:      SpO2: 94% 93% 92% 93%  Weight:      Height:        Intake/Output Summary (Last 24 hours) at 08/21/2021 1351 Last data filed at 08/20/2021 2257 Gross per 24 hour  Intake 3 ml  Output --  Net 3 ml   Last 3 Weights 08/20/2021 08/18/2021 08/14/2021  Weight (lbs) 221 lb 225 lb 225 lb  Weight (kg)  100.245 kg 102.059 kg 102.059 kg     Body mass index is 41.76 kg/m.  General:  Well nourished, well developed, in no acute distress HEENT: normal Neck: no JVD Vascular: No carotid bruits; Distal pulses 2+ bilaterally Cardiac:  normal S1, S2; RRR; no murmur  Lungs:  markedly diminished breath sound in bilateral bases Abd: soft, nontender, no hepatomegaly  Ext: no edema Musculoskeletal:  No deformities, BUE and BLE strength normal and equal Skin: warm and dry  Neuro:  CNs 2-12 intact, no focal abnormalities noted Psych:  Normal affect   EKG:  The EKG was personally reviewed and demonstrates: Unclear if sinus versus junctional rhythm, heart rate 57, no obvious ST-T wave changes.  P waves is not clearly seen Telemetry:  Telemetry was personally reviewed and demonstrates:  possible junctional rhythm with HR 49-55 bpm  Relevant CV Studies:  Echo 01/31/2021  1. Left ventricular ejection fraction, by estimation, is 60 to 65%. The  left ventricle has normal function. The left ventricle has no regional  wall motion abnormalities. There is moderate concentric left ventricular  hypertrophy. Left ventricular  diastolic function could not be evaluated.   2. Right ventricular systolic function is normal. The right ventricular  size is normal. There is normal pulmonary artery systolic pressure. The  estimated right ventricular systolic pressure is 64.4 mmHg.   3. Left atrial size was moderately dilated.   4. Right atrial size was mildly dilated.   5. A small pericardial effusion is present. The pericardial effusion is  posterior to the left ventricle.   6. The mitral valve is degenerative. Mild mitral valve regurgitation. No  evidence of mitral stenosis.   7. The aortic valve is tricuspid. There is mild calcification of the  aortic valve. There is mild thickening of the aortic valve. Aortic valve  regurgitation is not visualized. Mild aortic valve sclerosis is present,  with no evidence of  aortic valve  stenosis.   8. The inferior vena cava is normal in size with greater than 50%  respiratory variability, suggesting right atrial pressure of 3 mmHg.   Comparison(s): Changes from prior study are noted. The left ventricular  function has improved.   Laboratory Data:  High Sensitivity Troponin:   Recent Labs  Lab 08/20/21 1802 08/20/21 2005 08/21/21 0838 08/21/21 1055  TROPONINIHS 31* 37* 69* 59*     Chemistry Recent Labs  Lab 08/20/21 1142 08/20/21 1802 08/20/21 1911 08/21/21 0041 08/21/21 0348  NA 143 141 142 143 143  K 4.4 4.0 4.0 4.4 4.2  CL 99 99  --   --  101  CO2 38* 31  --   --  33*  GLUCOSE 117* 209*  --   --  105*  BUN 73* 71*  --   --  74*  CREATININE 2.35* 2.34*  --   --  2.73*  CALCIUM 9.7 9.3  --   --  8.9  MG  --   --   --   --  2.1  GFRNONAA  --  20*  --   --  17*  ANIONGAP  --  11  --   --  9    Recent Labs  Lab 08/20/21 1142 08/20/21 1802  PROT 7.3 7.5  ALBUMIN 3.8 3.6  AST 10 16  ALT 9 16  ALKPHOS 76 83  BILITOT 0.7 1.0   Lipids No results for input(s): CHOL, TRIG, HDL, LABVLDL, LDLCALC, CHOLHDL in the last 168 hours.  Hematology Recent Labs  Lab 08/20/21 1142 08/20/21 1802 08/20/21 1911 08/21/21 0041 08/21/21 0348  WBC 6.1 10.2  --   --  6.4  RBC 3.20* 3.46*  --   --  2.97*  HGB 10.0* 10.7* 11.2* 10.9* 9.2*  HCT 29.8* 35.6* 33.0* 32.0* 29.6*  MCV 93.2 102.9*  --   --  99.7  MCH  --  30.9  --   --  31.0  MCHC 33.5 30.1  --   --  31.1  RDW 17.3* 16.3*  --   --  16.3*  PLT 143.0* 163  --   --  128*   Thyroid  Recent Labs  Lab 08/20/21 1142  TSH 0.53    BNP Recent Labs  Lab 08/20/21 1142 08/20/21 2005  BNP  --  237.9*  PROBNP 279.0*  --     DDimer No results for input(s): DDIMER in the last 168 hours.   Radiology/Studies:  DG Chest 2 View  Result Date: 08/20/2021 CLINICAL DATA:  Pulmonary edema, follow-up EXAM: CHEST - 2 VIEW COMPARISON:  08/18/2021 FINDINGS: Bilateral airspace disease again noted,  similar to a prior study. Small bilateral pleural effusions. Mild cardiomegaly. Aortic atherosclerosis. No acute bony abnormality. IMPRESSION: Cardiomegaly with bilateral airspace disease which could reflect edema or infection. Small bilateral effusions. Findings similar to prior study. Electronically Signed   By: Rolm Baptise M.D.   On: 08/20/2021 18:34   DG Chest 2 View  Result Date: 08/18/2021 CLINICAL DATA:  Shortness of breath. EXAM: CHEST - 2 VIEW COMPARISON:  X-ray chest 05/05/2021. FINDINGS: Stable cardiomediastinal silhouette with mild cardiomegaly. No pneumothorax. Small bilateral pleural effusions, right greater than left, similar. New patchy hazy parahilar opacities throughout both lungs with chronic bibasilar scarring versus atelectasis. IMPRESSION: 1. Mild cardiomegaly with new patchy hazy parahilar opacities throughout both lungs, favor pulmonary edema, with multilobar pneumonia not excluded. 2. Small bilateral pleural effusions, right greater than left, similar to prior radiograph. These results will be called to the ordering clinician or representative by the Radiologist Assistant, and communication documented in the PACS or [employer]. Electronically Signed   By: Ilona Sorrel M.D.   On: 08/18/2021 14:13   DG Chest Portable 1 View  Result Date: 08/20/2021 CLINICAL DATA:  Show shortness of breath EXAM: PORTABLE CHEST 1 VIEW COMPARISON:  08/20/2021 FINDINGS: Diffuse bilateral airspace disease, worsening since prior study. Cardiomegaly. Suspect small bilateral effusions. Aortic atherosclerosis. No acute bony abnormality. IMPRESSION: Worsening diffuse bilateral airspace disease and small bilateral effusions. Findings concerning for edema/CHF. Pneumonia not excluded. Electronically Signed   By: Rolm Baptise M.D.   On: 08/20/2021 18:33     Assessment and Plan:   Acute on chronic diastolic heart failure  -proBNP is elevated similar to July 2022 during the last admission for heart  failure.  Chest x-ray consistent with CHF exacerbation as well.   -Agree with 40 mg twice daily of IV Lasix.  Will repeat limited echocardiogram.  Closely monitor renal function, after the initial dose of 60 mg IV Lasix yesterday, creatinine increased slightly.  Acute on chronic respiratory failure: Related to hypoxia and hypercapnia.  Altered mental status and confusion on initial arrival.  Mental status has improved  PAF: Not a candidate for anticoagulation due to high bleeding risk with chronic thrombocytopenia and a history of GI bleed.  Hypertension: Arrived with initial systolic blood pressure of 215, now blood pressure is in the 130s.  Only blood pressure medication at home is to 7.5 mg daily of amlodipine.  Patient does have a history of hypertensive urgency as well in the past.  Her blood pressure was not severely elevated during the recent office visit with pulmonology and oncology/hematology service.  Suspect elevation of the blood pressure was due to acute respiratory failure rather than the reverse.  Hyperlipidemia  DM2  Chronic thrombocytopenia: Received platelet raising injections at oncology service and also iron infusion.  Acute on CKD stage IV  Hypothyroidism: TSH normal  History of junctional bradycardia: Heart rate on arrival has been in the 50s to 70s range.  Previously has been evaluated by EP, junctional rhythm versus A. fib with junctional escape.    Risk Assessment/Risk Scores:        New York Heart Association (NYHA) Functional Class NYHA Class IV  CHA2DS2-VASc Score = 5   This indicates a 7.2% annual risk of stroke. The patient's score is based upon: CHF History: 1 HTN History: 1 Diabetes History: 0 Stroke History: 0 Vascular Disease History: 0 Age Score: 2 Gender Score: 1         For questions or updates, please contact [place] Please consult [email or web address] for contact info under    Signed, Almyra Deforest, [place]  08/21/2021 1:51  PM  Patient seen and examined.  Agree with above documentation.  Ms. Alonna Minium is an 86 year old female with history of chronic diastolic heart failure, paroxysmal atrial fibrillation, chronic thrombocytopenia, T2DM, hypertension, CKD stage IV, OSA not on CPAP, chronic hypoxic respiratory failure who we are consulted by Dr. Benny Lennert for evaluation of heart failure.  Most recent echocardiogram 01/31/2021 showed EF 60 to 65%.  She was admitted in July 2022 for acute on chronic diastolic heart failure, ultimately was discharged on home O2.  At follow-up appointment with Dr. Harrell Gave in August 2022, noted to be in junctional rhythm, metoprolol was discontinued.  She was seen by EP 05/2021, felt to be in junctional rhythm versus AF with slow VR, rates 50s.  She has been having progressive shortness of breath over the last week.  Her daughter reports she was eating less.  She has not been using CPAP as not able to afford.  She was seen by her pulmonologist on 12/12 and underwent chest x-ray which showed small bilateral pleural effusion and bilateral hazy opacities suggestive of pulmonary edema.  Her home torsemide was increased to 60 mg daily x3 days.  She did report 6 pound weight loss with this but continued to have worsening shortness of breath.  Presented to the ED on 12/14 for further evaluation.  Initial vital signs notable for SBP up to 215 and SPO2 86% on 6 L [place].  Chest x-ray showed small bilateral pleural effusion, worsening bilateral airspace disease.  Initial creatinine 2.3, VBG with pH 7.26, PCO2 80.  proBNP 279.  Troponins 31 > 37 > 69 > 59.  She was given IV Lasix 60 mg x 1.  Creatinine this morning increased to 2.73.  EKG showed accelerated junctional  rhythm, rate 71, low voltage.  Telemetry shows junctional rhythm with rate in 50s.  On exam, patient is on Bipap, somnolent but arousable and oriented x3, bradycardic, normal rhythm, diminished breath sounds at bases, no lower extremity edema, JVD difficult to  assess given habitus.  For her acute on chronic diastolic heart failure, would continue IV Lasix.  Closely monitor renal function.  Daily weights and strict I's/O's.  We will update echocardiogram.  She is also significantly hypercarbic on presentation, suspect OHS/OSA.  She has not been on CPAP for her OSA  Donato Heinz, MD

## 2021-08-21 NOTE — Progress Notes (Signed)
PROGRESS NOTE  [name] GEZ:[id number] DOB: [birth year] DOA: 08/20/2021 PCP: Caren Macadam, MD  Brief History   [name] is a pleasant 86 y.o. female with medical history significant for OSA on CPAP, chronic diastolic CHF, PAF not anticoagulated due to history of GI bleed, ITP, IDA, CKD stage IV, IDDM, depression, and anxiety, now presenting to the emergency department with worsening shortness of breath and somnolence.  The patient is accompanied by her daughter who assists with the history.  Patient lives alone but has daughters who check on her frequently and have noted that the patient has become progressively short of breath over the past week or more and has been progressively fatigued and now somnolent.  She had increased leg swelling and reported a sensation of fullness around her abdomen, had her diuretic increased 2 days ago and lost 7 pounds since then but continued to have increased shortness of breath and somnolence.  She uses a walker at baseline.  For the past month has wanted family to put her in a wheelchair, had not been taking her diuretic consistently due to not wanting to have to go to the restroom, and had been urinating on herself.  The patient had been seen by pulmonology, PCP, and oncology this week prior to presentation in the ED.    ED Course: Upon arrival to the ED, patient is found to be afebrile, saturating 86% on 6 L/min of supplemental oxygen, and with initial SBP of 215.  EKG features accelerated junctional rhythm.  Chest x-ray with worsening diffuse bilateral airspace disease and small bilateral pleural effusions.  Chemistry panel notable for glucose 209 and creatinine 2.34.  CBC with hemoglobin 10.7 and MCV 102.9.  ABG with pH 2.67 and pCO2 80.2.  Patient was placed on BiPAP and treated with IV Lasix, Rocephin, and briefly with nitroglycerin infusion in the ED.  The patient was found to have a UTI. She was started on Rocephin.   [facility] were consulted to admit the patient for further evaluation and treatment.  The patient is admitted as inpatient to a progressive bed on BIPAP.  Her troponin upon arrival was 31. The next troponin was 37. When it was rechecked this am it was 59. EKG demonstrated a junctional rhythm. Cardiology was consulted.   The patient remains on BIPAP. She is saturating at 95% on 40% bleed in with BIPAP. ABG has been ordered to follow her respiratory acidosis.  She is a DNR   Consultants  Cardiology  Procedures  None  Antibiotics   Anti-infectives (From admission, onward)    Start     Dose/Rate Route Frequency Ordered Stop   08/21/21 1800  cefTRIAXone (ROCEPHIN) 1 g in sodium chloride 0.9 % 100 mL IVPB        1 g 200 mL/hr over 30 Minutes Intravenous Every 24 hours 08/21/21 0013     08/20/21 2030  cefTRIAXone (ROCEPHIN) 1 g in sodium chloride 0.9 % 100 mL IVPB        1 g 200 mL/hr over 30 Minutes Intravenous  Once 08/20/21 2019 08/20/21 2121       Subjective  The patient is resting comfortably on BIPAP. Her daughter is at bedside. The patient states that she likes BIPAP. No new complaints.  Objective   Vitals:  Vitals:   08/21/21 1200 08/21/21 1300  BP: (!) 132/49 (!) 148/47  Pulse: (!) 56 (!) 59  Resp: 19 (!) 22  Temp:    SpO2: 92% 93%  Exam:  Constitutional:  The patient is awake, alert, and oriented x 3. She is on BIPAP. No acute distress. Respiratory:  Pt is on BIPAP. Positive for scattered rales throughout.  No wheezes or rhonchi are appreciated. No tactile fremitus Cardiovascular:  Regular rate and rhythm No murmurs, ectopy, or gallups. No lateral PMI. No thrills. Abdomen:  Abdomen is soft, non-tender, non-distended No hernias, masses, or organomegaly Normoactive bowel sounds.  Musculoskeletal:  No cyanosis or clubbing 2+ pitting edema to lower extremities bilaterally Skin:  No rashes, lesions, ulcers palpation of skin: no induration or  nodules Neurologic:  CN 2-12 intact Patient is moving all extremities. Speech fluent Psychiatric:  Mental status Mood, affect appropriate Orientation to person, place, time  judgment and insight appear intact I have personally reviewed the following:   Today's Data  Vitals  Lab Data  CBC BMP  Micro Data  Urine culture  Imaging  CXR  Cardiology Data  Troponin EKG  Scheduled Meds:  atorvastatin  40 mg Oral Daily   furosemide  40 mg Intravenous Q12H   heparin  5,000 Units Subcutaneous Q8H   hyoscyamine  0.125 mg Oral Daily   insulin aspart  0-9 Units Subcutaneous Q4H   insulin glargine-yfgn  25 Units Subcutaneous QHS   levothyroxine  112 mcg Oral QAC breakfast   sertraline  100 mg Oral QHS   sodium chloride flush  3 mL Intravenous Q12H   Continuous Infusions:  cefTRIAXone (ROCEPHIN)  IV      Principal Problem:   Acute on chronic diastolic CHF (congestive heart failure) (HCC) Active Problems:   DM type 2 (diabetes mellitus, type 2) (HCC)   Depression, recurrent (HCC)   Sleep apnea   Acute on chronic respiratory failure with hypoxia and hypercapnia (HCC)   Chronic kidney disease (CKD) stage G4/A1, severely decreased glomerular filtration rate (GFR) between 15-29 mL/min/1.73 square meter and albuminuria creatinine ratio less than 30 mg/g (HCC)   Chronic ITP (idiopathic thrombocytopenia) (HCC)   Physical deconditioning   LOS: 1 day    A & P  Chronic ITP (idiopathic thrombocytopenia) (HCC) Platelets are stable at 128. They have ranged from 107 to 140 in the past 60 days. NPlate was not given on her most recent visit to Oncology, although she did receive an iron infusion or on admission. Monitor.  Chronic kidney disease (CKD) stage G4/A1, severely decreased glomerular filtration rate (GFR) between 15-29 mL/min/1.73 square meter and albuminuria creatinine ratio less than 30 mg/g (HCC) Pt has AKI on CKD G4A1 with severely decreased filtration rate (15-29  mL/min/1.73 msquared) with albumin/creatinine ration less than 30 mg/g. Creatinine is increased from baseline of 1.80 to 2.73. Will monitor creatinine, electrolytes and volume status. Avoid nephrotoxins and hypotension. Will check renal ultrasound.  Depression, recurrent ([place]) Continue Zoloft as at home.  DM type 2 (diabetes mellitus, type 2) ([place]) At home the patient's glucoses have been controlled with Lantus 46 Units qhs, Trulicity 6.27 mg Qwk, Lispro prn meals. Will follow glucoses with FSBS and SSI for now. Glucose upon admission was 105. Will also check HbA1c.  Physical deconditioning Noted. Will have PT/OT eval and treat when appropriate.  Sleep apnea Pt uses CPAP at home, but states that she likes BIPAP better.  Urinary tract infection, site not specified Urine culture is pending. Patient has been started on Rocephin.  Acute on chronic diastolic CHF (congestive heart failure) (HCC) Echocardiogram from 01/2021 demonstrated an EF of 60-65%. Diastolic function could not be evaluated on that study. Echo from  06/2019 demonstrated an EF of 50-55% with Grade 1 diastolic function. Will order another echocardiogram. Cardiology has been consulted. Continue to diurese as possible. She is receiving lasix 40 mg IV Q 12.  Acute on chronic respiratory failure with hypoxia and hypercapnia (HCC) Pt presented in ED with hypoxic and hypercapneic respiratory failure. She was placed on BIPAP. ABG repeated this afternoon demonstrated resolution of the respiratory acidosis. BIPAP has been removed. I will recheck ABG in 3-4 hours to evaluate her ventilation.  Elevated troponin Initial troponin 31, then 37, then 69. EKG demonstrates a junctional rhythm. Cardiology has been consulted. I appreciate their help.  I have seen and examined this patient myself. I have spent 42 minutes in her evaluation and care.  DVT prophylaxis: sq heparin  Code Status: DNR, confirmed with daughter on admission  Level of Care:  Level of care: Progressive Family Communication: Daughter at bedside  Disposition Plan:  Patient is from: home  Anticipated d/c is to: TBD Anticipated d/c date is: 08/24/21 Patient currently: Pending improvement in respiratory status  Consults called: Cardiology Admission status: Inpatient    Myrna Vonseggern, DO [facility] Direct contact: see [email or web address]  7PM-7AM contact night coverage as above 08/21/2021, 5:54 PM  LOS: 1 day

## 2021-08-21 NOTE — ED Notes (Signed)
Bolus is complete. MD is aware of pressure

## 2021-08-21 NOTE — Assessment & Plan Note (Addendum)
Echocardiogram from 01/2021 demonstrated an EF of 60-65%. Diastolic function could not be evaluated on that study. Echo from 06/2019 demonstrated an EF of 50-55% with Grade 1 diastolic function. Will order another echocardiogram. Cardiology has been consulted. Pulmonology has been consulted.Continue to diurese as possible. Lasix has been increased to 80 mg IV Q 12 hours. Clinically the patient nearing a dry weight.

## 2021-08-21 NOTE — TOC Initial Note (Signed)
Transition of Care [facility]) - Initial/Assessment Note    Patient Details  Name: [name] MRN: [id number] Date of Birth: Apr 07, 1935  [facility] of [facility]) CM/SW Contact:    Verdell Carmine, RN Phone Number: 08/21/2021, 9:05 AM  Clinical Narrative:                  Ms Lightsey is a 86 yo patient who presented with ongoing SHOB ,leg swelling, AMS, history of CHF. . CO2 on ABG was 80, she uses CPAP at home. On bIpap here, Her mentation improved on Bipap.  She lives alone with daughters checking on her frequently. PT and OT assessment pending for recommendations. She may need Bipap at home, should order early if needed so it can be delivered in house and assessed by RT prior to DC.   CM to follow for needs, recommendations, and transitions.   Expected Discharge Plan: Skilled Nursing Facility Barriers to Discharge: Continued Medical Work up   Patient Goals and CMS Choice        Expected Discharge Plan and Services Expected Discharge Plan: [place]   Discharge Planning Services: CM Consult   Living arrangements for the past 2 months: Single Family Home                                      Prior Living Arrangements/Services Living arrangements for the past 2 months: Single Family Home Lives with:: Self Patient language and need for interpreter reviewed:: Yes        Need for Family Participation in Patient Care: Yes (Comment) Care giver support system in place?: Yes (comment) Current home services: DME Criminal Activity/Legal Involvement Pertinent to Current Situation/Hospitalization: No - Comment as needed  Activities of Daily Living      Permission Sought/Granted                  Emotional Assessment       Orientation: : Fluctuating Orientation (Suspected and/or reported Sundowners) Alcohol / Substance Use: Not Applicable Psych Involvement: No (comment)  Admission diagnosis:  Acute on chronic diastolic CHF (congestive  heart failure) ([place]) [I50.33] Patient Active Problem List   Diagnosis Date Noted   Acute on chronic diastolic (congestive) heart failure ([place]) 03/14/2021   History of anxiety 03/03/2021   History of blood clotting disorder 03/03/2021   History of complications due to general anesthesia 03/03/2021   Acute on chronic diastolic CHF (congestive heart failure) ([place]) 01/30/2021   Lateral epicondylitis of right elbow 08/28/2020   Pain in joint of right elbow 07/15/2020   Calculus of gallbladder without cholecystitis without obstruction 09/28/2019   Infection and inflammatory reaction due to indwelling urethral catheter, subsequent encounter 09/28/2019   Nondisplaced fracture of lateral malleolus of right fibula, subsequent encounter for closed fracture with routine healing 09/28/2019   Hemorrhage of anus and rectum 08/07/2019   Unspecified protein-calorie malnutrition ([place]) 08/07/2019   Difficulty in walking, not elsewhere classified 07/18/2019   Dysphagia, oropharyngeal phase 07/18/2019   Chronic respiratory failure with hypoxia ([place]) 07/03/2019   Atypical atrial flutter (HCC)    Paroxysmal atrial fibrillation ([place]) 05/24/2018   Blister (nonthermal), unspecified lower leg, initial encounter 03/24/2018   Cellulitis, unspecified 03/24/2018   Diarrhea, unspecified 03/24/2018   Immune thrombocytopenic purpura ([place]) 03/24/2018   Major depressive disorder, recurrent, mild ([place]) 03/24/2018   Nausea with vomiting, unspecified 03/24/2018   Atherosclerotic heart disease of native  coronary artery without angina pectoris 01/13/2018   Candidal vulvovaginitis 01/13/2018   Dental caries, unspecified 01/13/2018   Hypertensive heart and chronic kidney disease with heart failure and stage 1 through stage 4 chronic kidney disease, or unspecified chronic kidney disease ([place]) 01/13/2018   Long term (current) use of insulin ([place]) 01/13/2018   Muscle weakness (generalized) 01/13/2018   Other adverse food  reactions, not elsewhere classified, initial encounter 01/13/2018   Other chronic sinusitis 01/13/2018   Postprocedural hypothyroidism 01/13/2018   Unsteadiness on feet 01/13/2018   Urinary tract infection, site not specified 01/13/2018   Normocytic anemia 05/18/2017   Chronic ITP (idiopathic thrombocytopenia) ([place]) 01/25/2017   Hypercalciuria 01/15/2016   Hyperoxaluria 01/15/2016   Chronic kidney disease (CKD) stage G4/A1, severely decreased glomerular filtration rate (GFR) between 15-29 mL/min/1.73 square meter and albuminuria creatinine ratio less than 30 mg/g (HCC) 09/04/2015   Acute on chronic respiratory failure with hypoxia and hypercapnia (HCC) 08/24/2015   Decreased urine volume 07/10/2015   Hypocitraturia 07/10/2015   Physical deconditioning 10/11/2014   Latex allergy, contact dermatitis 10/09/2014   Primary osteoarthritis of left knee 09/17/2014   Clostridium difficile carrier 06/07/2014   Constipation, unspecified 06/07/2012   Nutritional deficiency, unspecified 06/07/2012   Tachycardia 06/07/2012   Dyslipidemia 02/12/2012   GERD (gastroesophageal reflux disease) 02/12/2012   DM type 2 (diabetes mellitus, type 2) ([place]) 08/05/2010   GOUT 06/23/2010   DERMATITIS 01/16/2010   BACK PAIN 06/13/2009   MYALGIA 06/13/2009   Anxiety state 12/13/2008   LEG EDEMA, CHRONIC 11/08/2008   Sleep apnea 10/26/2008   Hypothyroidism 09/27/2008   HYPERCHOLESTEROLEMIA 09/27/2008   OBESITY 09/27/2008   Depression, recurrent ([place]) 09/27/2008   Essential hypertension 09/27/2008   Allergic rhinitis 09/27/2008   ASTHMA, CHILDHOOD 09/27/2008   PCP:  Caren Macadam, MD Pharmacy:   [facility] [place], [place] - [address] AT [facility] OF [address] [place] [address] Phone: [phone] Fax: [phone]     Social Determinants of Health (SDOH) Interventions    Readmission Risk Interventions Readmission Risk Prevention  Plan 07/20/2019  Transportation Screening Complete  PCP or Specialist Appt within 3-5 Days Not Complete  Not Complete comments unknown DC date  [place] or [place] Complete  Social Work Consult for [place] Planning/Counseling Not Complete  SW consult not completed comments following for needs  Palliative Care Screening Not Complete  Medication Review (RN Transport planner) Referral to Pharmacy  Some recent data might be hidden

## 2021-08-21 NOTE — Assessment & Plan Note (Addendum)
Pt has AKI on CKD G4A1 with severely decreased filtration rate (15-29 mL/min/1.73 msquared) with albumin/creatinine ration less than 30 mg/g. Creatinine is increased from baseline of 1.80 to 3.09. Will monitor creatinine, electrolytes and volume status. Avoid nephrotoxins and hypotension. Will check renal ultrasound. Creatinine is 2.68 today.

## 2021-08-21 NOTE — Assessment & Plan Note (Addendum)
At home the patient's glucoses have been controlled with Lantus 46 Units qhs, Trulicity 1.36 mg Qwk, Lispro prn meals. Will follow glucoses with FSBS and SSI for now. Glucose upon admission was 105. Will also check HbA1c. Glucoses have run from 148 to 257.

## 2021-08-21 NOTE — Progress Notes (Signed)
Patient placed back on bipap due to work of breathing and sats in the 80's. Vitals are now stable. RT will monitor.

## 2021-08-21 NOTE — Assessment & Plan Note (Addendum)
Pt presented in ED with hypoxic and hypercapneic respiratory failure. She was placed on BIPAP. ABG repeated this afternoon demonstrated resolution of the respiratory acidosis. BIPAP has been removed. I will recheck ABG in 3-4 hours to evaluate her ventilation. Pulmonology has been consulted. They have recommended continued diuresis and attempts to wean. Pt continues to be dependent upon BIPAP. Weaning O2 as possible.

## 2021-08-21 NOTE — Progress Notes (Signed)
Patient transported on V60 BIPAP from ED Room 15 to 8G88 with no complications.

## 2021-08-21 NOTE — Assessment & Plan Note (Signed)
Initial troponin 31, then 37, then 69. EKG demonstrates a junctional rhythm. Cardiology has been consulted. I appreciate their help.

## 2021-08-22 ENCOUNTER — Ambulatory Visit (HOSPITAL_BASED_OUTPATIENT_CLINIC_OR_DEPARTMENT_OTHER): Payer: Medicare Other | Admitting: Internal Medicine

## 2021-08-22 ENCOUNTER — Inpatient Hospital Stay (HOSPITAL_COMMUNITY): Payer: Medicare Other

## 2021-08-22 ENCOUNTER — Telehealth: Payer: Self-pay | Admitting: Pulmonary Disease

## 2021-08-22 DIAGNOSIS — N179 Acute kidney failure, unspecified: Secondary | ICD-10-CM

## 2021-08-22 DIAGNOSIS — J9622 Acute and chronic respiratory failure with hypercapnia: Secondary | ICD-10-CM

## 2021-08-22 DIAGNOSIS — I10 Essential (primary) hypertension: Secondary | ICD-10-CM

## 2021-08-22 DIAGNOSIS — I480 Paroxysmal atrial fibrillation: Secondary | ICD-10-CM

## 2021-08-22 DIAGNOSIS — I484 Atypical atrial flutter: Secondary | ICD-10-CM

## 2021-08-22 DIAGNOSIS — J9621 Acute and chronic respiratory failure with hypoxia: Secondary | ICD-10-CM

## 2021-08-22 DIAGNOSIS — G4733 Obstructive sleep apnea (adult) (pediatric): Secondary | ICD-10-CM

## 2021-08-22 DIAGNOSIS — I5033 Acute on chronic diastolic (congestive) heart failure: Secondary | ICD-10-CM

## 2021-08-22 DIAGNOSIS — L8990 Pressure ulcer of unspecified site, unspecified stage: Secondary | ICD-10-CM | POA: Diagnosis present

## 2021-08-22 LAB — BASIC METABOLIC PANEL
Anion gap: 10 (ref 5–15)
BUN: 81 mg/dL — ABNORMAL HIGH (ref 8–23)
CO2: 32 mmol/L (ref 22–32)
Calcium: 9 mg/dL (ref 8.9–10.3)
Chloride: 102 mmol/L (ref 98–111)
Creatinine, Ser: 2.82 mg/dL — ABNORMAL HIGH (ref 0.44–1.00)
GFR, Estimated: 16 mL/min — ABNORMAL LOW (ref >60)
Glucose, Bld: 142 mg/dL — ABNORMAL HIGH (ref 70–99)
Potassium: 3.9 mmol/L (ref 3.5–5.1)
Sodium: 144 mmol/L (ref 135–145)

## 2021-08-22 LAB — CBC
HCT: 27.4 % — ABNORMAL LOW (ref 36.0–46.0)
Hemoglobin: 8.7 g/dL — ABNORMAL LOW (ref 12.0–15.0)
MCH: 31.1 pg (ref 26.0–34.0)
MCHC: 31.8 g/dL (ref 30.0–36.0)
MCV: 97.9 fL (ref 80.0–100.0)
Platelets: 128 10*3/uL — ABNORMAL LOW (ref 150–400)
RBC: 2.8 MIL/uL — ABNORMAL LOW (ref 3.87–5.11)
RDW: 16.6 % — ABNORMAL HIGH (ref 11.5–15.5)
WBC: 9.1 10*3/uL (ref 4.0–10.5)
nRBC: 0 % (ref 0.0–0.2)

## 2021-08-22 LAB — URINE CULTURE
Culture: >=100000 — AB
MICRO NUMBER:: 12756491
SPECIMEN QUALITY:: ADEQUATE

## 2021-08-22 LAB — BLOOD GAS, ARTERIAL
Acid-Base Excess: 7.1 mmol/L — ABNORMAL HIGH (ref 0.0–2.0)
Bicarbonate: 31.7 mmol/L — ABNORMAL HIGH (ref 20.0–28.0)
FIO2: 40
O2 Saturation: 94.4 %
Patient temperature: 36.5
pCO2 arterial: 48.7 mmHg — ABNORMAL HIGH (ref 32.0–48.0)
pH, Arterial: 7.426 (ref 7.350–7.450)
pO2, Arterial: 70.1 mmHg — ABNORMAL LOW (ref 83.0–108.0)

## 2021-08-22 LAB — ECHOCARDIOGRAM LIMITED
Area-P 1/2: 3.51 cm2
Height: 61 in
S' Lateral: 2.2 cm
Weight: 3477.98 oz

## 2021-08-22 LAB — GLUCOSE, CAPILLARY
Glucose-Capillary: 157 mg/dL — ABNORMAL HIGH (ref 70–99)
Glucose-Capillary: 164 mg/dL — ABNORMAL HIGH (ref 70–99)
Glucose-Capillary: 166 mg/dL — ABNORMAL HIGH (ref 70–99)
Glucose-Capillary: 179 mg/dL — ABNORMAL HIGH (ref 70–99)
Glucose-Capillary: 151 mg/dL — ABNORMAL HIGH (ref 70–99)

## 2021-08-22 MED ORDER — FUROSEMIDE 10 MG/ML IJ SOLN
80.0000 mg | Freq: Three times a day (TID) | INTRAMUSCULAR | Status: AC
  Administered 2021-08-22 – 2021-08-23 (×3): 80 mg via INTRAVENOUS
  Filled 2021-08-22 (×3): qty 8

## 2021-08-22 MED ORDER — ZINC OXIDE 40 % EX OINT
TOPICAL_OINTMENT | Freq: Every day | CUTANEOUS | Status: DC
  Filled 2021-08-22: qty 57

## 2021-08-22 MED ORDER — SODIUM CHLORIDE 0.9 % IV SOLN: INTRAVENOUS | Status: DC | PRN

## 2021-08-22 MED ORDER — AMLODIPINE BESYLATE 5 MG PO TABS
5.0000 mg | ORAL_TABLET | Freq: Every day | ORAL | Status: DC
  Administered 2021-08-23 – 2021-08-27 (×5): 5 mg via ORAL
  Filled 2021-08-22 (×6): qty 1

## 2021-08-22 MED ORDER — POTASSIUM CHLORIDE 20 MEQ PO PACK: 40.0000 meq | PACK | Freq: Once | ORAL | Status: DC

## 2021-08-22 MED ORDER — DEXTROSE 5 % IV SOLN
500.0000 mg | Freq: Once | INTRAVENOUS | Status: AC
  Administered 2021-08-22: 500 mg via INTRAVENOUS
  Filled 2021-08-22: qty 500

## 2021-08-22 MED ORDER — AMLODIPINE BESYLATE 5 MG PO TABS: 7.5000 mg | ORAL_TABLET | Freq: Every day | ORAL | Status: DC

## 2021-08-22 MED ORDER — HYDRALAZINE HCL 20 MG/ML IJ SOLN: 5.0000 mg | INTRAMUSCULAR | Status: DC | PRN

## 2021-08-22 NOTE — TOC Progression Note (Signed)
Transition of Care [facility]) - Progression Note    Patient Details  Name: [name] MRN: [id number] Date of Birth: [birth year]  Transition of Care [facility]) CM/SW Contact  Zenon Mayo, RN Phone Number: 08/22/2021, 9:40 AM  Clinical Narrative:    NCM spoke with daughter [name] in the room , she states patient lives alone and she has caregivers from 10 am to 6 pm and then 8 pm to 8 am and then [place] fills in the gaps.  Pharmacy she uses is [facility], she has home oxygen with [employer] 3 liters at home, but she is on 5 liters here, she has a w/chair, rollator, shower chair, lift chair, cpap with [employer].  NCM asked daughter if she needs bipap does she have a preference of the agency, she states [employer] since she has her oxygen and cpap with them.  NCM made referral to [facility] with [employer] for a bipap, he will look into for patient. [name] [phone] Haynes Kerns [phone] (daughters).  Before she came to hospital she was getting outpatient physical therapy at [facility] but will not be able to continue at this time.  She may be needing to go to SNF, will await to see recs for when she will be able to work with Physical therapy.   Expected Discharge Plan: Skilled Nursing Facility Barriers to Discharge: Continued Medical Work up  Expected Discharge Plan and Services Expected Discharge Plan: [place]   Discharge Planning Services: CM Consult   Living arrangements for the past 2 months: Single Family Home                                       Social Determinants of Health (SDOH) Interventions    Readmission Risk Interventions Readmission Risk Prevention Plan 07/20/2019  Transportation Screening Complete  PCP or Specialist Appt within 3-5 Days Not Complete  Not Complete comments unknown DC date  [place] or [place] Complete  Social Work Consult for [place] Planning/Counseling Not Complete  SW consult not completed comments  following for needs  Palliative Care Screening Not Complete  Medication Review (RN Transport planner) Referral to Pharmacy  Some recent data might be hidden

## 2021-08-22 NOTE — Consult Note (Signed)
° °  [facility] CM Inpatient Consult   08/22/2021  Katie Faraone 1935-01-04 [id number]  [facility] [ACO] Patient: [employer]  Primary Care Provider:  Caren Macadam, MD, [place] Jacklynn Ganong is an embedded provider with a Chronic Care Management team and program, and is listed for the transition of care follow up and appointments.  Patient was screened for Embedded practice service needs for chronic care management however patient with ongoing medical follow up needs. Patient working with nursing on rounds. Continue to follow.   Plan: A referral can be sent to the [facility] Management team when disposition and TOC is determined.  Please contact for further questions,  Natividad Brood, RN BSN [facility] Liaison  [phone] business mobile phone Toll free office [phone]  Fax number: [phone] [email or web address]

## 2021-08-22 NOTE — Progress Notes (Signed)
SLP Cancellation Note  Patient Details Name: [name] MRN: [id number] DOB: 1934-11-01   Cancelled treatment:       Reason Eval/Treat Not Completed: Patient not medically ready. Pt had to be put back on BiPAP this morning after being taken off initially. Will f/u for swallow eval as able.    Osie Bond., M.A. [place] [facility] Pager [phone] Office [phone]  08/22/2021, 8:55 AM

## 2021-08-22 NOTE — Consult Note (Addendum)
[place] Nurse Consult Note: Patient receiving care in [place] Reason for Consult: Buttocks wound Wound type: MASD/IAD in the intergluteal fold and spreading to the inner bilateral buttocks MASD/ITD in the pannus folds Pressure Injury POA: NA Wound bed: pink macerated Dressing procedure/placement/frequency: Clean the sacral area with no rinse cleanser, pat dry and apply a coat of Desitin skin barrier cream over the area. Apply twice daily or PRN soiling.  Clean under the pannus with soap and water, pat dry and place Interday Ag+ in the folds to wick the moisture. Measure and cut length of InterDry to fit in skin folds that have skin breakdown Tuck InterDry fabric into skin folds in a single layer, allow for 2 inches of overhang from skin edges to allow for wicking to occur May remove to bathe; dry area thoroughly and then tuck into affected areas again DO NOT APPLY any creams or ointments when using InterDry DO NOT THROW AWAY FOR 5 DAYS unless soiled with stool DO NOT [facility] product, this will inactivate the silver in the material  New sheet of Interdry should be applied after 5 days of use if patient continues to have skin breakdown Kellie Simmering [phone]    Consider Additional Pressure Injury Prevention Measures such as:  Support surfaces (air mattress) chair cushion Kellie Simmering # [phone]) Heel offloading boots Kellie Simmering # [phone]) Turning and Positioning  Measures to reduce shear (draw sheet, knees up) Skin protection Products (Foam dressing) Moisture management products (Critic-Aid Barrier Cream-Purple top) [place]ween moisturizing lotion (Pink top in clean supply) Nutrition Management Protection for Medical Devices Routine Skin Assessment  Monitor the wound area(s) for worsening of condition such as: Signs/symptoms of infection, increase in size, development of or worsening of odor, development of pain, or increased pain at the affected locations.   Notify the medical team if any of these develop.  Thank you  for the consult. [place] nurse will not follow at this time.   Please re-consult the [place] team if needed.  Cathlean Marseilles Tamala Julian, MSN, RN, [place], Lysle Pearl, [facility] Wound Treatment Associate Pager [phone]

## 2021-08-22 NOTE — Progress Notes (Signed)
PROGRESS NOTE  [name] SNK:[id number] DOB: March 24, 1935 DOA: 08/20/2021 PCP: Caren Macadam, MD  Brief History   [name] is a pleasant 86 y.o. female with medical history significant for OSA on CPAP, chronic diastolic CHF, PAF not anticoagulated due to history of GI bleed, ITP, IDA, CKD stage IV, IDDM, depression, and anxiety, now presenting to the emergency department with worsening shortness of breath and somnolence.  The patient is accompanied by her daughter who assists with the history.  Patient lives alone but has daughters who check on her frequently and have noted that the patient has become progressively short of breath over the past week or more and has been progressively fatigued and now somnolent.  She had increased leg swelling and reported a sensation of fullness around her abdomen, had her diuretic increased 2 days ago and lost 7 pounds since then but continued to have increased shortness of breath and somnolence.  She uses a walker at baseline.  For the past month has wanted family to put her in a wheelchair, had not been taking her diuretic consistently due to not wanting to have to go to the restroom, and had been urinating on herself.  The patient had been seen by pulmonology, PCP, and oncology this week prior to presentation in the ED.    ED Course: Upon arrival to the ED, patient is found to be afebrile, saturating 86% on 6 L/min of supplemental oxygen, and with initial SBP of 215.  EKG features accelerated junctional rhythm.  Chest x-ray with worsening diffuse bilateral airspace disease and small bilateral pleural effusions.  Chemistry panel notable for glucose 209 and creatinine 2.34.  CBC with hemoglobin 10.7 and MCV 102.9.  ABG with pH 2.67 and pCO2 80.2.  Patient was placed on BiPAP and treated with IV Lasix, Rocephin, and briefly with nitroglycerin infusion in the ED.  The patient was found to have a UTI. She was started on Rocephin.   [facility] were consulted to admit the patient for further evaluation and treatment.  The patient is admitted as inpatient to a progressive bed on BIPAP.  Her troponin upon arrival was 31. The next troponin was 37. When it was rechecked this am it was 4. EKG demonstrated a junctional rhythm. Cardiology was consulted.   The patient remains on BIPAP. She is saturating at 95% on 40% bleed in with BIPAP. ABG has been ordered to follow her respiratory acidosis.  She is a DNR   Consultants  Cardiology  Procedures  None  Antibiotics   Anti-infectives (From admission, onward)    Start     Dose/Rate Route Frequency Ordered Stop   08/21/21 1800  cefTRIAXone (ROCEPHIN) 1 g in sodium chloride 0.9 % 100 mL IVPB        1 g 200 mL/hr over 30 Minutes Intravenous Every 24 hours 08/21/21 0013     08/20/21 2030  cefTRIAXone (ROCEPHIN) 1 g in sodium chloride 0.9 % 100 mL IVPB        1 g 200 mL/hr over 30 Minutes Intravenous  Once 08/20/21 2019 08/20/21 2121       Subjective  The patient is resting comfortably on BIPAP. Her daughter is at bedside. The patient states that she likes BIPAP. No new complaints.  Objective   Vitals:  Vitals:   08/22/21 1132 08/22/21 1535  BP: (!) 163/59   Pulse: (!) 51 (!) 53  Resp: 16 20  Temp: 98.7 F (37.1 C)   SpO2: 99%  99%    Exam:  Constitutional:  The patient is awake, alert, and oriented x 3. She is on BIPAP. No acute distress. Respiratory:  Pt is on BIPAP. Positive for scattered rales throughout.  No wheezes or rhonchi are appreciated. No tactile fremitus Cardiovascular:  Regular rate and rhythm No murmurs, ectopy, or gallups. No lateral PMI. No thrills. Abdomen:  Abdomen is soft, non-tender, non-distended No hernias, masses, or organomegaly Normoactive bowel sounds.  Musculoskeletal:  No cyanosis or clubbing 2+ pitting edema to lower extremities bilaterally Skin:  No rashes, lesions, ulcers palpation of skin: no induration or  nodules Neurologic:  CN 2-12 intact Patient is moving all extremities. Speech fluent Psychiatric:  Mental status Mood, affect appropriate Orientation to person, place, time  judgment and insight appear intact I have personally reviewed the following:   Today's Data  Vitals  Lab Data  CBC BMP  Micro Data  Urine culture  Imaging  CXR  Cardiology Data  Troponin EKG  Scheduled Meds:  amLODipine  5 mg Oral Daily   atorvastatin  40 mg Oral Daily   Chlorhexidine Gluconate Cloth  6 each Topical Daily   furosemide  80 mg Intravenous Q8H   heparin  5,000 Units Subcutaneous Q8H   hyoscyamine  0.125 mg Oral Daily   insulin aspart  0-9 Units Subcutaneous Q4H   levothyroxine  112 mcg Oral QAC breakfast   liver oil-zinc oxide   Topical Daily   potassium chloride  40 mEq Oral Once   sertraline  100 mg Oral QHS   sodium chloride flush  3 mL Intravenous Q12H   Continuous Infusions:  cefTRIAXone (ROCEPHIN)  IV Stopped (08/21/21 1836)   chlorothiazide (DIURIL) IV      Principal Problem:   Acute on chronic respiratory failure with hypoxia and hypercapnia (HCC) Active Problems:   DM type 2 (diabetes mellitus, type 2) (HCC)   Depression, recurrent (HCC)   Sleep apnea   Chronic kidney disease (CKD) stage G4/A1, severely decreased glomerular filtration rate (GFR) between 15-29 mL/min/1.73 square meter and albuminuria creatinine ratio less than 30 mg/g (HCC)   Chronic ITP (idiopathic thrombocytopenia) (HCC)   Acute on chronic diastolic CHF (congestive heart failure) (HCC)   Physical deconditioning   Urinary tract infection, site not specified   Elevated troponin   Pressure injury of skin   LOS: 2 days    A & P  Chronic ITP (idiopathic thrombocytopenia) (HCC) Platelets are stable at 128. They have ranged from 107 to 140 in the past 60 days. NPlate was not given on her most recent visit to Oncology, although she did receive an iron infusion or on admission. Monitor.  Chronic  kidney disease (CKD) stage G4/A1, severely decreased glomerular filtration rate (GFR) between 15-29 mL/min/1.73 square meter and albuminuria creatinine ratio less than 30 mg/g (HCC) Pt has AKI on CKD G4A1 with severely decreased filtration rate (15-29 mL/min/1.73 msquared) with albumin/creatinine ration less than 30 mg/g. Creatinine is increased from baseline of 1.80 to 2.83. Will monitor creatinine, electrolytes and volume status. Avoid nephrotoxins and hypotension. Will check renal ultrasound.  Depression, recurrent ([place]) Continue Zoloft as at home.  DM type 2 (diabetes mellitus, type 2) ([place]) At home the patient's glucoses have been controlled with Lantus 46 Units qhs, Trulicity 4.09 mg Qwk, Lispro prn meals. Will follow glucoses with FSBS and SSI for now. Glucose upon admission was 105. Will also check HbA1c. Glucoses have run from 101 to 166.  Physical deconditioning Noted. Will have PT/OT eval and  treat when appropriate.  Sleep apnea Pt uses CPAP at home, but states that she likes BIPAP better. Perhaps can get BIPAP for home use.  Urinary tract infection, site not specified Urine culture is pending. Patient has been started on Rocephin.  Acute on chronic diastolic CHF (congestive heart failure) (HCC) Echocardiogram from 01/2021 demonstrated an EF of 60-65%. Diastolic function could not be evaluated on that study. Echo from 06/2019 demonstrated an EF of 50-55% with Grade 1 diastolic function. Will order another echocardiogram. Cardiology has been consulted. Continue to diurese as possible. Lasix has been icnreased to 80 mg IV Q 9.  Acute on chronic respiratory failure with hypoxia and hypercapnia (HCC) Pt presented in ED with hypoxic and hypercapneic respiratory failure. She was placed on BIPAP. ABG repeated this afternoon demonstrated resolution of the respiratory acidosis. BIPAP has been removed. I will recheck ABG in 3-4 hours to evaluate her ventilation. Pulmonology has been consulted.  They have recommended continued diuresis and attempts to wean.  Elevated troponin Initial troponin 31, then 37, then 69. EKG demonstrates a junctional rhythm. Cardiology has been consulted. I appreciate their help.  I have seen and examined this patient myself. I have spent 32 minutes in her evaluation and care.  DVT prophylaxis: sq heparin  Code Status: DNR, confirmed with daughter on admission  Level of Care: Level of care: Progressive Family Communication: Daughter at bedside  Disposition Plan:  Patient is from: home  Anticipated d/c is to: TBD Anticipated d/c date is: 08/24/21 Patient currently: Pending improvement in respiratory status  Consults called: Cardiology Admission status: Inpatient    Toia Micale, DO [facility] Direct contact: see [email or web address]  7PM-7AM contact night coverage as above 08/22/2021, 3:47 PM  LOS: 1 day

## 2021-08-22 NOTE — Progress Notes (Signed)
PT Cancellation Note  Patient Details Name: [name] MRN: [id number] DOB: [birth year]   Cancelled Treatment:    Reason Eval/Treat Not Completed: Patient not medically ready. Pt off bipap briefly and now back on bipap. Will follow up at later date for medical readiness.    Shary Decamp [facility] 08/22/2021, 12:54 PM [place] Pager [phone] Office [phone]

## 2021-08-22 NOTE — Telephone Encounter (Signed)
Called and spoke with pt's daughter Richardean Sale who stated pt is currently in the hospital. Pt went to see PCP 12/14 and had labwork done and after results of some of the labs were back, they received a phone call that pt needed to go to the hospital immediately and pt was admitted 12/14.  Stated to [facility] that we would make Dr. Elsworth Soho aware and she verbalized understanding.  Routing to Dr. Elsworth Soho as an Juluis Rainier.

## 2021-08-22 NOTE — Consult Note (Signed)
NAME:  [name], MRN:  [id number], DOB:  [birth year], LOS: 2 ADMISSION DATE:  08/20/2021, CONSULTATION DATE:  08/22/21 REFERRING MD:  Benny Lennert TRH, CHIEF COMPLAINT:  SOB, hypoxia    History of Present Illness:  86 yo F PMH OSA on CPAP, diastolic HF, ITP, GIB, CKD IV, PAF  who presented to ED 12/14 with SOB and somnolence. Hypoxic in ED SpO2 86, hypertensive with SBP > 200 requiring brief nitro gtt. She was admitted to [facility] service for acute on chronic heart failure exacerbation and acute on chronic hypoxic respiratory failure. She was treated with abx BID lasix and  BiPAP. Cardiology was consulted 12/15.   Pulm was consulted 12/16  as she remains on BiPAP. PCCM consulted in this setting   CXR 12/16 with bilateral pleural effusions   Pertinent  Medical History  OSA Diastolic Hf DM HTN ITP CKD IV  PAF GIB PE  Depression   Significant Hospital Events: Including procedures, antibiotic start and stop dates in addition to other pertinent events   12/14 ED for SOB, admit to [facility], started on BID lasix and BiPAP 12/15 cards consult 12/16 still on BiPAP. PCCM consult  Interim History / Subjective:   Still on BiPAP. Consulted  Net neg 750 ml this admission  Objective   Blood pressure (!) 163/59, pulse (!) 51, temperature 98.7 F (37.1 C), temperature source Oral, resp. rate 16, height 5\' 1"  (1.549 m), weight 98.6 kg, SpO2 99 %.    FiO2 (%):  [40 %-50 %] 40 %   Intake/Output Summary (Last 24 hours) at 08/22/2021 1149 Last data filed at 08/22/2021 1138 Gross per 24 hour  Intake 770.22 ml  Output 1525 ml  Net -754.78 ml   Filed Weights   08/20/21 1850 08/21/21 2035 08/22/21 0432  Weight: 100.2 kg 98.6 kg 98.6 kg    Examination: General: Chronically ill appearing elderly F on BiPAP nad  HENT: NCAT BiPAP in place Lungs: Crackles and diminished bases  Cardiovascular: bradycardic cap refill < 3 sec  Abdomen: obese soft ndnt  Extremities: no acute deformity.  BLE  non-pitting edema  Neuro: Awake alert following commands  GU: clear yellow urine, foley   Resolved Hospital Problem list     Assessment & Plan:    Acute on chronic respiratory failure with hypoxia OSA on noct CPAP Acute on chronic diastolic HF  Bilateral Pleural effusions Pulmonary edema -she has been getting Lasix 40mg  BID. CXR still very wet. On 60mg  demadex qD at home  P -Probably have room to increase diuresis here before considering thoras.  -will change to Lasix 80mg  q8he x 3 doses and 1x 500mg  diuril  -will order 2mEq Kcl 12/16 evening -follow up on ECHO 12/16 -AM BMP, AM CXR  -Pulm hygiene, mobility   Goals of Care DNR status - pt is DNR status which is appropriate - this may also be a case where Palliative consult could be beneficial -- in d/w pt and pt daughter, it seems like there is not a good understanding of the significance of pts several chronic disease processes and how these impact each other.    Best Practice (right click and "Reselect all SmartList Selections" daily)   Diet/type: NPO on BiPAP  DVT prophylaxis: prophylactic heparin  GI prophylaxis: N/A Lines: N/A Foley:  Yes, and it is still needed Code Status:  DNR Last date of multidisciplinary goals of care discussion [family updated by ccm 12/16]  Labs   CBC: Recent Labs  Lab 08/20/21 1142 08/20/21  1802 08/20/21 1911 08/21/21 0041 08/21/21 0348 08/21/21 1500 08/22/21 0148  WBC 6.1 10.2  --   --  6.4  --  9.1  NEUTROABS 4.9 8.6*  --   --   --   --   --   HGB 10.0* 10.7* 11.2* 10.9* 9.2* 9.5* 8.7*  HCT 29.8* 35.6* 33.0* 32.0* 29.6* 28.0* 27.4*  MCV 93.2 102.9*  --   --  99.7  --  97.9  PLT 143.0* 163  --   --  128*  --  128*    Basic Metabolic Panel: Recent Labs  Lab 08/20/21 1142 08/20/21 1802 08/20/21 1911 08/21/21 0041 08/21/21 0348 08/21/21 1500 08/22/21 0148  NA 143 141 142 143 143 145 144  K 4.4 4.0 4.0 4.4 4.2 4.1 3.9  CL 99 99  --   --  101  --  102  CO2 38* 31  --    --  33*  --  32  GLUCOSE 117* 209*  --   --  105*  --  142*  BUN 73* 71*  --   --  74*  --  81*  CREATININE 2.35* 2.34*  --   --  2.73*  --  2.82*  CALCIUM 9.7 9.3  --   --  8.9  --  9.0  MG  --   --   --   --  2.1  --   --    GFR: Estimated Creatinine Clearance: 16 mL/min (A) (by C-G formula based on SCr of 2.82 mg/dL (H)). Recent Labs  Lab 08/20/21 1142 08/20/21 1802 08/20/21 1829 08/20/21 2005 08/21/21 0348 08/22/21 0148  WBC 6.1 10.2  --   --  6.4 9.1  LATICACIDVEN  --   --  0.8 0.9  --   --     Liver Function Tests: Recent Labs  Lab 08/20/21 1142 08/20/21 1802  AST 10 16  ALT 9 16  ALKPHOS 76 83  BILITOT 0.7 1.0  PROT 7.3 7.5  ALBUMIN 3.8 3.6   No results for input(s): LIPASE, AMYLASE in the last 168 hours. No results for input(s): AMMONIA in the last 168 hours.  ABG    Component Value Date/Time   PHART 7.426 08/22/2021 1123   PCO2ART 48.7 (H) 08/22/2021 1123   PO2ART 70.1 (L) 08/22/2021 1123   HCO3 31.7 (H) 08/22/2021 1123   TCO2 37 (H) 08/21/2021 1500   O2SAT 94.4 08/22/2021 1123     Coagulation Profile: Recent Labs  Lab 08/20/21 1802  INR 1.1    Cardiac Enzymes: No results for input(s): CKTOTAL, CKMB, CKMBINDEX, TROPONINI in the last 168 hours.  HbA1C: Hemoglobin A1C  Date/Time Value Ref Range Status  05/26/2021 03:37 PM 6.8 (A) 4.0 - 5.6 % Final   Hemoglobin A1c  Date/Time Value Ref Range Status  08/19/2017 02:00 PM 7.1 (H) 4.8 - 5.6 % Final    Comment:             Prediabetes: 5.7 - 6.4          Diabetes: >6.4          Glycemic control for adults with diabetes: <7.0    Hgb A1c MFr Bld  Date/Time Value Ref Range Status  08/21/2021 03:48 AM 6.0 (H) 4.8 - 5.6 % Final    Comment:    (NOTE) Pre diabetes:          5.7%-6.4%  Diabetes:              >  6.4%  Glycemic control for   <7.0% adults with diabetes   03/26/2021 02:24 PM 6.9 (H) 4.6 - 6.5 % Final    Comment:    Glycemic Control Guidelines for People with Diabetes:Non  Diabetic:  <6%Goal of Therapy: <7%Additional Action Suggested:  >8%     CBG: Recent Labs  Lab 08/21/21 2215 08/21/21 2332 08/22/21 0418 08/22/21 0850 08/22/21 1136  GLUCAP 122* 133* 157* 164* 166*    Review of Systems:   Review of Systems  Constitutional: Negative.   HENT: Negative.    Eyes: Negative.   Respiratory:  Positive for shortness of breath.   Cardiovascular:  Positive for leg swelling.  Gastrointestinal: Negative.   Genitourinary: Negative.   Musculoskeletal: Negative.   Skin: Negative.   Neurological:  Positive for weakness.  Endo/Heme/Allergies: Negative.   Psychiatric/Behavioral: Negative.      Past Medical History:  She,  has a past medical history of Allergic rhinitis, Anxiety state, unspecified, CHF (congestive heart failure) ([place]), Depressive disorder, not elsewhere classified, Disorientation, unspecified (08/07/2019), Epistaxis (03/24/2018), Extrinsic asthma, unspecified, History of complications due to general anesthesia (03/03/2021), History of pulmonary embolism (08/07/2019), Hypertensive urgency (01/30/2021), Obesity, OSA on CPAP, Pneumonitis (07/14/2019), Pressure injury of skin (07/10/2019), Pulmonary embolism ([place]), Pure hypercholesterolemia, Respiratory failure with hypoxia ([place]) (09/2008), Scoliosis, Type II or unspecified type diabetes mellitus without mention of complication, not stated as uncontrolled, and Unspecified hypothyroidism.   Surgical History:   Past Surgical History:  Procedure Laterality Date   CARDIOVERSION N/A 06/26/2019   Procedure: CARDIOVERSION;  Surgeon: Jerline Pain, MD;  Location: [facility] ENDOSCOPY;  Service: Cardiovascular;  Laterality: N/A;   EYE SURGERY Bilateral 2005   cataracts, implants   KIDNEY STONE SURGERY Bilateral    2016, 2017    knee replaced Left 2013   THYROID SURGERY  1966   nodule removal; partial thyroidectomy; radioactive iodine x2; 1990's     Social History:   reports that she quit smoking about 31 years  ago. Her smoking use included cigarettes. She started smoking about 65 years ago. She has a 5.00 pack-year smoking history. She has never used smokeless tobacco. She reports that she does not drink alcohol and does not use drugs.   Family History:  Her family history includes Allergies in her mother; Arthritis in her daughter; Asthma in her mother; Clotting disorder in her maternal grandmother, maternal uncle, and mother; Coronary artery disease in an other family member; Depression in her maternal grandfather; Diabetes in her daughter and paternal grandmother; High Cholesterol in her daughter; High blood pressure in her daughter; Hyperlipidemia in her father; Hypertension in her father and mother; Osteoarthritis in her mother; Rheum arthritis in her maternal grandmother; Stroke in her mother and another family member. There is no history of Breast cancer.   Allergies Allergies  Allergen Reactions   Adhesive [Tape] Other (See Comments)    PULLS OFF THE SKIN   Apixaban Other (See Comments)    Internal Bleeding   Aspirin Itching, Rash, Hives and Swelling    Swelling of her tongue   Mirabegron Other (See Comments)    Patient experienced A-Fib   Pineapple Anaphylaxis and Swelling    Throat swells and blisters on tongue and roof of mouth per patient   Metformin Diarrhea and Nausea Only   Tetracycline Hives   Fluticasone-Salmeterol Itching and Rash   Iodinated Diagnostic Agents Itching and Rash        Lactose Intolerance (Gi) Other (See Comments)    Gas  Latex Itching, Rash and Other (See Comments)    Pulls off the skin and causes welts   Lisinopril Cough   Metronidazole Other (See Comments)    Reaction not known   Sulfa Antibiotics Rash   Sulfonamide Derivatives Itching and Rash     Home Medications  Prior to Admission medications   Medication Sig Start Date End Date Taking? Authorizing Provider  acetaminophen (TYLENOL) 325 MG tablet Take 650 mg by mouth in the morning and at  bedtime.   Yes [provider]  amLODipine (NORVASC) 2.5 MG tablet Take 3 tablets (7.5 mg total) by mouth daily. 02/18/21  Yes Koberlein, Junell C, MD  amoxicillin (AMOXIL) 500 MG tablet Take 2,000 mg by mouth as needed. Prior to Dental Appt 02/18/21  Yes [provider]  atorvastatin (LIPITOR) 40 MG tablet TAKE 1 TABLET(40 MG) BY MOUTH AT BEDTIME Patient taking differently: Take 40 mg by mouth at bedtime. 05/20/21  Yes Koberlein, Steele Berg, MD  BD PEN NEEDLE NANO 2ND GEN 32G X 4 MM MISC USE TO INJECT UP TO FOUR TIMES DAILY AS DIRECTED Patient taking differently: 1 each by Other route See admin instructions. USE TO INJECT UP TO FOUR TIMES DAILY AS DIRECTED 02/27/21  Yes Philemon Kingdom, MD  bisacodyl (DULCOLAX) 5 MG EC tablet Take 10 mg by mouth every 3 (three) days as needed (for constipation).   Yes [provider]  Bismuth Tribromoph-Petrolatum (XEROFORM PETROLAT PATCH 4"X4") PADS Apply 1 Units topically daily as needed. 11/08/20  Yes Koberlein, Steele Berg, MD  Cholecalciferol (VITAMIN D3) 2000 units TABS Take 5,000 Units by mouth daily.   Yes [provider]  Continuous Blood Gluc Sensor (FREESTYLE LIBRE 14 DAY SENSOR) MISC APPLY NEW SENSOR EVERY 14 DAYS AS DIRECTED Patient taking differently: 1 each by Other route See admin instructions. APPLY NEW SENSOR EVERY 14 DAYS AS DIRECTED 12/16/20  Yes Philemon Kingdom, MD  Dulaglutide (TRULICITY) 4.28 JG/8.1LX SOPN Inject 0.75 mg into the skin once a week. Patient taking differently: Inject 0.75 mg into the skin once a week. sunday 05/26/21  Yes Philemon Kingdom, MD  fexofenadine (ALLEGRA) 180 MG tablet Take 180 mg by mouth daily. 01/04/20  Yes [provider]  hyoscyamine (LEVSIN) 0.125 MG tablet Take 1 tablet by mouth daily. 01/06/20  Yes [provider]  insulin glargine (LANTUS SOLOSTAR) 100 UNIT/ML Solostar Pen Inject 46 Units into the skin at bedtime. 08/20/21  Yes Koberlein, Junell C, MD  insulin  lispro (HUMALOG KWIKPEN) 100 UNIT/ML KwikPen INJECT 13 TO 20 UNITS UNDER THE SKIN THREE TIMES DAILY WITH BREAKFAST, LUNCH AND EVENING MEAL Patient taking differently: Inject 13-20 Units into the skin See admin instructions. INJECT 13 TO 20 UNITS UNDER THE SKIN THREE TIMES DAILY WITH BREAKFAST, LUNCH AND EVENING MEAL 08/18/21  Yes Philemon Kingdom, MD  Insulin Syringe-Needle U-100 (INSULIN SYRINGE .3CC/29GX1/2") 29G X 1/2" 0.3 ML MISC Use to inject insulin 4 times a day. Patient taking differently: 1 each by Other route See admin instructions. Use to inject insulin 4 times a day. 01/02/20  Yes Philemon Kingdom, MD  levothyroxine (SYNTHROID) 112 MCG tablet TAKE 1 TABLET(112 MCG) BY MOUTH DAILY Patient taking differently: Take 112 mcg by mouth daily before breakfast. 04/21/21  Yes Philemon Kingdom, MD  Multiple Vitamin (MULTIVITAMIN) tablet Take 1 tablet by mouth daily.   Yes [provider]  potassium citrate (UROCIT-K) 10 MEQ (1080 MG) SR tablet Take 20 mEq by mouth 2 (two) times daily. 05/22/21  Yes [provider]  sertraline (ZOLOFT) 50 MG tablet TAKE 1 TABLET BY MOUTH AT BEDTIME Patient taking differently: 100 mg. Take 2 daily 07/19/21  Yes Koberlein, Junell C, MD  torsemide (DEMADEX) 20 MG tablet Take 60 mg by mouth every morning. 04/25/21  Yes [provider]  vitamin B-12 (CYANOCOBALAMIN) 100 MCG tablet Take 100 mcg by mouth daily.   Yes [provider]  vitamin C (ASCORBIC ACID) 250 MG tablet Take 250 mg by mouth daily.   Yes [provider]     Critical care time: n/a      Eliseo Gum MSN, AGACNP-BC [place] for pager 08/22/2021, 12:35 PM

## 2021-08-22 NOTE — Progress Notes (Signed)
PT Cancellation Note  Patient Details Name: [name] MRN: [id number] DOB: October 16, 1934   Cancelled Treatment:    Reason Eval/Treat Not Completed: Patient not medically ready. Pt placed back on bipap this AM. Will continue to monitor for readiness.    [name] [facility] 08/22/2021, 9:29 AM [name] [place] Pager [phone] Office [phone]

## 2021-08-22 NOTE — Plan of Care (Signed)
°  Problem: Clinical Measurements: Goal: Ability to maintain clinical measurements within normal limits will improve Outcome: Progressing   Problem: Clinical Measurements: Goal: Respiratory complications will improve Outcome: Progressing   Problem: Clinical Measurements: Goal: Cardiovascular complication will be avoided Outcome: Progressing   Problem: Safety: Goal: Ability to remain free from injury will improve Outcome: Progressing   Problem: Skin Integrity: Goal: Risk for impaired skin integrity will decrease Outcome: Progressing

## 2021-08-22 NOTE — Progress Notes (Signed)
°  Echocardiogram 2D Echocardiogram limited has been performed.  [name] M 08/22/2021, 8:41 AM

## 2021-08-22 NOTE — Progress Notes (Signed)
RT NOTES: Called to patient's room. Pt appears flushed and states she cannot breathe and wants to go back on the bipap. Sats 93%. HR 53. Placed patient back on bipap with previous settings. PT states she feels better.

## 2021-08-22 NOTE — Progress Notes (Signed)
OT Cancellation Note  Patient Details Name: [name] MRN: [id number] DOB: 01-07-1935   Cancelled Treatment:    Reason Eval/Treat Not Completed: Medical issues which prohibited therapy Pt briefly off BiPAP but unable to tolerate so placed back on. Will follow-up for OT eval pending appropriateness tomorrow  Layla Maw 08/22/2021, 12:58 PM

## 2021-08-22 NOTE — Progress Notes (Signed)
RT NOTES: Removed patient from bipap and placed on 5L [place]. SATs 98%. Will continue to monitor.

## 2021-08-22 NOTE — Progress Notes (Signed)
RN tried to wean patient off the BiPAP per patient request but after being off for about 15 minutes patient called that she was very hot and couldn't breath, RN came to assessed and patient O2 sat had drop to 85% on [place] 5L, patient placed back on bipap sat up to 95%.

## 2021-08-22 NOTE — Progress Notes (Addendum)
Progress Note  Patient Name: [name] Date of Encounter: 08/22/2021  [facility] Cardiologist: Buford Dresser, MD   Subjective   Incomplete I/os, recorded as net -550 cc yesterday.  BP 156/51.  Weight down 4 pounds from admission.  She reports persistent dyspnea, remains on Bipap  Inpatient Medications    Scheduled Meds:  atorvastatin  40 mg Oral Daily   Chlorhexidine Gluconate Cloth  6 each Topical Daily   furosemide  40 mg Intravenous Q12H   heparin  5,000 Units Subcutaneous Q8H   hyoscyamine  0.125 mg Oral Daily   insulin aspart  0-9 Units Subcutaneous Q4H   insulin glargine-yfgn  25 Units Subcutaneous QHS   levothyroxine  112 mcg Oral QAC breakfast   sertraline  100 mg Oral QHS   sodium chloride flush  3 mL Intravenous Q12H   Continuous Infusions:  cefTRIAXone (ROCEPHIN)  IV Stopped (08/21/21 1836)   dextrose 25 mL/hr at 08/22/21 0600   PRN Meds: acetaminophen **OR** acetaminophen, labetalol, ondansetron **OR** ondansetron (ZOFRAN) IV, senna-docusate   Vital Signs    Vitals:   08/22/21 0432 08/22/21 0731 08/22/21 0732 08/22/21 0751  BP:   (!) 156/51 (!) 156/51  Pulse:   (!) 52 (!) 53  Resp:   (!) 22 (!) 21  Temp:  97.6 F (36.4 C)    TempSrc:  Oral    SpO2:   99% 99%  Weight: 98.6 kg     Height:        Intake/Output Summary (Last 24 hours) at 08/22/2021 0914 Last data filed at 08/22/2021 0600 Gross per 24 hour  Intake 770.22 ml  Output 1325 ml  Net -554.78 ml   Last 3 Weights 08/22/2021 08/21/2021 08/20/2021  Weight (lbs) 217 lb 6 oz 217 lb 6 oz 221 lb  Weight (kg) 98.6 kg 98.6 kg 100.245 kg      Telemetry    Junctional rhythm, sinus brady 40-50s - Personally Reviewed  ECG    No new ECG - Personally Reviewed  Physical Exam   GEN: No acute distress.   Neck: No JVD appreciated but difficult to assess given habitus Cardiac: bradycardic, regular, no murmurs, rubs, or gallops.  Respiratory: On BiPAP, diminished at  bases GI: Soft, nontender, non-distended  MS: No edema; No deformity. Neuro:  Nonfocal  Psych: Normal affect   Labs    High Sensitivity Troponin:   Recent Labs  Lab 08/20/21 1802 08/20/21 2005 08/21/21 0838 08/21/21 1055  TROPONINIHS 31* 37* 69* 59*     Chemistry Recent Labs  Lab 08/20/21 1142 08/20/21 1802 08/20/21 1911 08/21/21 0348 08/21/21 1500 08/22/21 0148  NA 143 141   < > 143 145 144  K 4.4 4.0   < > 4.2 4.1 3.9  CL 99 99  --  101  --  102  CO2 38* 31  --  33*  --  32  GLUCOSE 117* 209*  --  105*  --  142*  BUN 73* 71*  --  74*  --  81*  CREATININE 2.35* 2.34*  --  2.73*  --  2.82*  CALCIUM 9.7 9.3  --  8.9  --  9.0  MG  --   --   --  2.1  --   --   PROT 7.3 7.5  --   --   --   --   ALBUMIN 3.8 3.6  --   --   --   --   AST 10 16  --   --   --   --  ALT 9 16  --   --   --   --   ALKPHOS 76 83  --   --   --   --   BILITOT 0.7 1.0  --   --   --   --   GFRNONAA  --  20*  --  17*  --  16*  ANIONGAP  --  11  --  9  --  10   < > = values in this interval not displayed.    Lipids No results for input(s): CHOL, TRIG, HDL, LABVLDL, LDLCALC, CHOLHDL in the last 168 hours.  Hematology Recent Labs  Lab 08/20/21 1802 08/20/21 1911 08/21/21 0348 08/21/21 1500 08/22/21 0148  WBC 10.2  --  6.4  --  9.1  RBC 3.46*  --  2.97*  --  2.80*  HGB 10.7*   < > 9.2* 9.5* 8.7*  HCT 35.6*   < > 29.6* 28.0* 27.4*  MCV 102.9*  --  99.7  --  97.9  MCH 30.9  --  31.0  --  31.1  MCHC 30.1  --  31.1  --  31.8  RDW 16.3*  --  16.3*  --  16.6*  PLT 163  --  128*  --  128*   < > = values in this interval not displayed.   Thyroid  Recent Labs  Lab 08/20/21 1142  TSH 0.53    BNP Recent Labs  Lab 08/20/21 1142 08/20/21 2005  BNP  --  237.9*  PROBNP 279.0*  --     DDimer No results for input(s): DDIMER in the last 168 hours.   Radiology    DG Chest 2 View  Result Date: 08/20/2021 CLINICAL DATA:  Pulmonary edema, follow-up EXAM: CHEST - 2 VIEW COMPARISON:   08/18/2021 FINDINGS: Bilateral airspace disease again noted, similar to a prior study. Small bilateral pleural effusions. Mild cardiomegaly. Aortic atherosclerosis. No acute bony abnormality. IMPRESSION: Cardiomegaly with bilateral airspace disease which could reflect edema or infection. Small bilateral effusions. Findings similar to prior study. Electronically Signed   By: Rolm Baptise M.D.   On: 08/20/2021 18:34   US RENAL  Result Date: 08/21/2021 CLINICAL DATA:  Acute kidney injury. EXAM: RENAL / URINARY TRACT ULTRASOUND COMPLETE COMPARISON:  CT abdomen and pelvis 09/28/2019. Right upper quadrant abdominal ultrasound 09/28/2019. Renal ultrasound 07/29/2019. FINDINGS: Right Kidney: Renal measurements: 11.0 x 5.7 x 5.8 cm = volume: 188 mL. Renal cortical thinning and mildly increased parenchymal echogenicity. No mass or hydronephrosis. Left Kidney: Renal measurements: 10.6 x 6.2 x 6.6 cm = volume: 226 mL. Overall poor visualization of the left kidney due to body habitus. Renal cortical thinning and mildly increased parenchymal echogenicity. 2.5 cm cyst. No hydronephrosis. Bladder: Decompressed by Foley catheter. Other: Stones in the gallbladder measuring up to 1.5 cm. Additional complex material filling the gallbladder containing multiple septations, new from 09/28/2019. IMPRESSION: 1. Bilateral renal cortical thinning and increased parenchymal echogenicity compatible with medical renal disease. No hydronephrosis. 2. Cholelithiasis. 3. New complex material in the gallbladder with multiple septations, indeterminate. Considerations include complex sludge, membranous cholecystitis, sequelae of chronic inflammation, hemorrhage, and cystic tumor. Correlate for clinical signs of acute cholecystitis and consider abdominal MRI without and with contrast for further evaluation. Electronically Signed   By: Logan Bores M.D.   On: 08/21/2021 17:11   DG Chest Portable 1 View  Result Date: 08/20/2021 CLINICAL DATA:   Show shortness of breath EXAM: PORTABLE CHEST 1 VIEW COMPARISON:  08/20/2021 FINDINGS: Diffuse  bilateral airspace disease, worsening since prior study. Cardiomegaly. Suspect small bilateral effusions. Aortic atherosclerosis. No acute bony abnormality. IMPRESSION: Worsening diffuse bilateral airspace disease and small bilateral effusions. Findings concerning for edema/CHF. Pneumonia not excluded. Electronically Signed   By: Rolm Baptise M.D.   On: 08/20/2021 18:33    Cardiac Studies     Patient Profile     86 y.o. female with chronic diastolic heart failure, PAF, chronic thrombocytopenia followed by Dr. Marin Olp, HTN, HLD, DM II, chronic respiratory failure on home O2, CKD stage IV, hypothyroidism and OSA not compliant with CPAP who is being seen  for the evaluation of acute on chronic diastolic heart failure  Assessment & Plan    Acute on chronic diastolic heart failure: proBNP is elevated similar to July 2022 during the last admission for heart failure.  Chest x-ray consistent with CHF exacerbation as well.  Echo reviews, shows normal EF, G3DD, IVC fixed/dilated -Continue IV Lasix.  -Strict I's and O's and daily weights -Monitor renal function.  Creatinine stable from yesterday (2.7 > 2.8), though up from 2.4 on admission.  If rising will need to back off diuresis  Acute on chronic respiratory failure with hypoxia and hypercapnia.  Altered mental status and confusion on initial arrival.  Mental status has improved.  Likely due to diastolic heart failure as well as OHS/OSA.  Continue diuresis as above.  Agree with pulmonology evaluation.   PAF: Not on anticoagulation due to high bleeding risk with chronic thrombocytopenia and a history of GI bleed.   Hypertension: Arrived with initial systolic blood pressure of 215, now blood pressure is improved, but remains in 150s.  Only blood pressure medication at home is amlodipine.  Patient does have a history of hypertensive urgency as well in the past.   -Restart amlodipine   Chronic thrombocytopenia: Received platelet raising injections at oncology service and also iron infusion.   Acute on CKD stage IV: Closely monitor renal function with diuresis as above   Hypothyroidism: TSH normal   History of junctional bradycardia: Heart rate on arrival has been in the 50s to 70s range.  Previously has been evaluated by EP, junctional rhythm versus A. fib with junctional escape.  Currently in sinus bradycardia but intermittently in junctional rhythm   For questions or updates, please contact [place] Please consult [email or web address] for contact info under        Signed, Donato Heinz, MD  08/22/2021, 9:14 AM

## 2021-08-22 NOTE — Progress Notes (Signed)
OT Cancellation Note  Patient Details Name: [name] MRN: [id number] DOB: 26-Feb-1935   Cancelled Treatment:    Reason Eval/Treat Not Completed: Other (comment) BiPAP taken off this AM but placed back on shortly after due to pt difficulty breathing. Will hold OT eval for now and follow-up as schedule permits.  Layla Maw 08/22/2021, 8:22 AM

## 2021-08-22 NOTE — Progress Notes (Signed)
RT NOTES: ABG obtained and sent to lab. Lab tech notified.  

## 2021-08-23 ENCOUNTER — Inpatient Hospital Stay (HOSPITAL_COMMUNITY): Payer: Medicare Other

## 2021-08-23 DIAGNOSIS — Z7189 Other specified counseling: Secondary | ICD-10-CM

## 2021-08-23 DIAGNOSIS — I5033 Acute on chronic diastolic (congestive) heart failure: Secondary | ICD-10-CM

## 2021-08-23 DIAGNOSIS — Z515 Encounter for palliative care: Secondary | ICD-10-CM

## 2021-08-23 DIAGNOSIS — F339 Major depressive disorder, recurrent, unspecified: Secondary | ICD-10-CM

## 2021-08-23 DIAGNOSIS — J810 Acute pulmonary edema: Secondary | ICD-10-CM

## 2021-08-23 DIAGNOSIS — N184 Chronic kidney disease, stage 4 (severe): Secondary | ICD-10-CM

## 2021-08-23 DIAGNOSIS — Z66 Do not resuscitate: Secondary | ICD-10-CM

## 2021-08-23 DIAGNOSIS — J90 Pleural effusion, not elsewhere classified: Secondary | ICD-10-CM

## 2021-08-23 DIAGNOSIS — Z794 Long term (current) use of insulin: Secondary | ICD-10-CM

## 2021-08-23 DIAGNOSIS — E1122 Type 2 diabetes mellitus with diabetic chronic kidney disease: Secondary | ICD-10-CM

## 2021-08-23 LAB — CBC
HCT: 26.7 % — ABNORMAL LOW (ref 36.0–46.0)
Hemoglobin: 8.5 g/dL — ABNORMAL LOW (ref 12.0–15.0)
MCH: 31.7 pg (ref 26.0–34.0)
MCHC: 31.8 g/dL (ref 30.0–36.0)
MCV: 99.6 fL (ref 80.0–100.0)
Platelets: 133 10*3/uL — ABNORMAL LOW (ref 150–400)
RBC: 2.68 MIL/uL — ABNORMAL LOW (ref 3.87–5.11)
RDW: 16.9 % — ABNORMAL HIGH (ref 11.5–15.5)
WBC: 8.5 10*3/uL (ref 4.0–10.5)
nRBC: 0 % (ref 0.0–0.2)

## 2021-08-23 LAB — GLUCOSE, CAPILLARY
Glucose-Capillary: 155 mg/dL — ABNORMAL HIGH (ref 70–99)
Glucose-Capillary: 160 mg/dL — ABNORMAL HIGH (ref 70–99)
Glucose-Capillary: 163 mg/dL — ABNORMAL HIGH (ref 70–99)
Glucose-Capillary: 169 mg/dL — ABNORMAL HIGH (ref 70–99)
Glucose-Capillary: 175 mg/dL — ABNORMAL HIGH (ref 70–99)
Glucose-Capillary: 206 mg/dL — ABNORMAL HIGH (ref 70–99)

## 2021-08-23 LAB — BASIC METABOLIC PANEL
Anion gap: 10 (ref 5–15)
BUN: 86 mg/dL — ABNORMAL HIGH (ref 8–23)
CO2: 35 mmol/L — ABNORMAL HIGH (ref 22–32)
Calcium: 9 mg/dL (ref 8.9–10.3)
Chloride: 100 mmol/L (ref 98–111)
Creatinine, Ser: 3.09 mg/dL — ABNORMAL HIGH (ref 0.44–1.00)
GFR, Estimated: 14 mL/min — ABNORMAL LOW (ref >60)
Glucose, Bld: 157 mg/dL — ABNORMAL HIGH (ref 70–99)
Potassium: 3.6 mmol/L (ref 3.5–5.1)
Sodium: 145 mmol/L (ref 135–145)

## 2021-08-23 MED ORDER — HYDRALAZINE HCL 25 MG PO TABS
25.0000 mg | ORAL_TABLET | Freq: Three times a day (TID) | ORAL | Status: DC
  Administered 2021-08-23 – 2021-08-27 (×11): 25 mg via ORAL
  Filled 2021-08-23 (×11): qty 1

## 2021-08-23 MED ORDER — FUROSEMIDE 10 MG/ML IJ SOLN
80.0000 mg | Freq: Two times a day (BID) | INTRAMUSCULAR | Status: AC
  Administered 2021-08-23 – 2021-08-24 (×2): 80 mg via INTRAVENOUS
  Filled 2021-08-23 (×3): qty 8

## 2021-08-23 MED ORDER — INSULIN ASPART 100 UNIT/ML IJ SOLN
0.0000 [IU] | Freq: Three times a day (TID) | INTRAMUSCULAR | Status: DC
  Administered 2021-08-24: 07:00:00 3 [IU] via SUBCUTANEOUS
  Administered 2021-08-24: 13:00:00 5 [IU] via SUBCUTANEOUS
  Administered 2021-08-24 – 2021-08-25 (×2): 3 [IU] via SUBCUTANEOUS
  Administered 2021-08-25 – 2021-08-26 (×3): 5 [IU] via SUBCUTANEOUS
  Administered 2021-08-26 – 2021-08-27 (×4): 3 [IU] via SUBCUTANEOUS

## 2021-08-23 MED ORDER — CHLORHEXIDINE GLUCONATE CLOTH 2 % EX PADS
6.0000 | MEDICATED_PAD | Freq: Every day | CUTANEOUS | Status: DC
  Administered 2021-08-24 – 2021-08-26 (×3): 6 via TOPICAL

## 2021-08-23 MED ORDER — INSULIN ASPART 100 UNIT/ML IJ SOLN
0.0000 [IU] | Freq: Every day | INTRAMUSCULAR | Status: DC
  Administered 2021-08-24: 22:00:00 3 [IU] via SUBCUTANEOUS
  Administered 2021-08-25: 21:00:00 2 [IU] via SUBCUTANEOUS

## 2021-08-23 NOTE — Consult Note (Signed)
NAME:  [name], MRN:  [id number], DOB:  Sep 10, 1934, LOS: 3 ADMISSION DATE:  08/20/2021, CONSULTATION DATE:  08/22/21 REFERRING MD:  Benny Lennert TRH, CHIEF COMPLAINT:  SOB, hypoxia    History of Present Illness:  86 yo F PMH OSA on CPAP, diastolic HF, ITP, GIB, CKD IV, PAF  who presented to ED 12/14 with SOB and somnolence. Hypoxic in ED SpO2 86, hypertensive with SBP > 200 requiring brief nitro gtt. She was admitted to [facility] service for acute on chronic heart failure exacerbation and acute on chronic hypoxic respiratory failure. She was treated with abx BID lasix and  BiPAP. Cardiology was consulted 12/15.   Pulm was consulted 12/16  as she remains on BiPAP. PCCM consulted in this setting   CXR 12/16 with bilateral pleural effusions   Pertinent  Medical History  OSA Diastolic Hf DM HTN ITP CKD IV  PAF GIB PE  Depression   Significant Hospital Events: Including procedures, antibiotic start and stop dates in addition to other pertinent events   12/14 ED for SOB, admit to [facility], started on BID lasix and BiPAP 12/15 cards consult 12/16 still on BiPAP. PCCM consulted.  Interim History / Subjective:  Only wants to sleep with BiPAP. Was off bipap for 1.5 hours twice today, but went back on so she can sleep.   Objective   Blood pressure (!) 144/51, pulse (!) 53, temperature 97.7 F (36.5 C), temperature source Axillary, resp. rate 18, height 5\' 1"  (1.549 m), weight 96.7 kg, SpO2 91 %.        Intake/Output Summary (Last 24 hours) at 08/23/2021 1449 Last data filed at 08/23/2021 0730 Gross per 24 hour  Intake 0 ml  Output 1650 ml  Net -1650 ml    Filed Weights   08/21/21 2035 08/22/21 0432 08/23/21 0102  Weight: 98.6 kg 98.6 kg 96.7 kg    Examination: General: chronically ill appearing woman lying in bed in NAD HENT: [place]/AT, eyes anicteric Lungs: breathing comfortably on BiPAP, reduced basilar breath sounds Cardiovascular: bradycardia, irreg rhythm Abdomen: obese,  soft Extremities: ++pitting LE edema, stasis dermatitis, no cyanosis Neuro: sleepy, arouses to stimulation, moving all extremities Derm: warm, dry, no rashes  BUN 86 Cr 3.09 H/H 8.5/26.7 Platelets 133 CXR personally reviewed> slightly improved R pleural effusion, pulm edema persists  BNP on 12/14> 237.9  I/O -1.3L, net -1.9L for admission  Echo: LVEF 60-65%, mod LVH, normal RV function, RV mildly enlarged. Dilated IVC, normal variability.  Resolved Hospital Problem list     Assessment & Plan:   Acute on chronic respiratory failure with hypoxia OSA not on CPAP at home Acute on chronic HFpEF Bilateral pleural effusions Pulmonary edema. No suspicion for pneumonitis, that she had back in 2020. -she has been getting Lasix 40mg  BID. CXR still very wet. On 60mg  demadex qD at home  Plan: -Con't aggressive diuresis. Unfortunately she remains volume overloaded despite her renal function worsening. This raises question of unmasking worse CKD vs actually making her volume depleted. -Recheck BMP tomorrow morning to help guide her volume management. -CXR Monday morning -Pulm hygiene and mobility essential-- discussed with her daughters.  Would like to get her up to chair as much as possible. Her current pulmonary mechanics are terrible. RN to reposition her in bed for now.  -wean supplemental O2 as required to maintain SpO2 >90% -BiPAP when sleeping  Depression -This is going to be a major limitation to her willingness to participate in streps needed for her recovery. Already on  antidepressant chornically.   Goals of Care DNR status - pt is DNR status which is appropriate  -- in d/w pt and pt daughter, it seems like there is not a good understanding of the significance of pts several chronic disease processes and how these impact each other.   Daughter updated at bedside and other daughter via phone.  Best Practice (right click and "Reselect all SmartList Selections" daily)    Diet/type: NPO on BiPAP  DVT prophylaxis: prophylactic heparin  GI prophylaxis: N/A Lines: N/A Foley:  Yes, and it is still needed Code Status:  DNR Last date of multidisciplinary goals of care discussion [family updated by ccm 12/17]  Labs   CBC: Recent Labs  Lab 08/20/21 1142 08/20/21 1802 08/20/21 1911 08/21/21 0041 08/21/21 0348 08/21/21 1500 08/22/21 0148 08/23/21 0222  WBC 6.1 10.2  --   --  6.4  --  9.1 8.5  NEUTROABS 4.9 8.6*  --   --   --   --   --   --   HGB 10.0* 10.7*   < > 10.9* 9.2* 9.5* 8.7* 8.5*  HCT 29.8* 35.6*   < > 32.0* 29.6* 28.0* 27.4* 26.7*  MCV 93.2 102.9*  --   --  99.7  --  97.9 99.6  PLT 143.0* 163  --   --  128*  --  128* 133*   < > = values in this interval not displayed.     Basic Metabolic Panel: Recent Labs  Lab 08/20/21 1142 08/20/21 1802 08/20/21 1911 08/21/21 0041 08/21/21 0348 08/21/21 1500 08/22/21 0148 08/23/21 0222  NA 143 141   < > 143 143 145 144 145  K 4.4 4.0   < > 4.4 4.2 4.1 3.9 3.6  CL 99 99  --   --  101  --  102 100  CO2 38* 31  --   --  33*  --  32 35*  GLUCOSE 117* 209*  --   --  105*  --  142* 157*  BUN 73* 71*  --   --  74*  --  81* 86*  CREATININE 2.35* 2.34*  --   --  2.73*  --  2.82* 3.09*  CALCIUM 9.7 9.3  --   --  8.9  --  9.0 9.0  MG  --   --   --   --  2.1  --   --   --    < > = values in this interval not displayed.    GFR: Estimated Creatinine Clearance: 14.4 mL/min (A) (by C-G formula based on SCr of 3.09 mg/dL (H)). Recent Labs  Lab 08/20/21 1802 08/20/21 1829 08/20/21 2005 08/21/21 0348 08/22/21 0148 08/23/21 0222  WBC 10.2  --   --  6.4 9.1 8.5  LATICACIDVEN  --  0.8 0.9  --   --   --      Liver Function Tests: Recent Labs  Lab 08/20/21 1142 08/20/21 1802  AST 10 16  ALT 9 16  ALKPHOS 76 83  BILITOT 0.7 1.0  PROT 7.3 7.5  ALBUMIN 3.8 3.6    No results for input(s): LIPASE, AMYLASE in the last 168 hours. No results for input(s): AMMONIA in the last 168  hours.   Julian Hy, DO 08/23/21 6:59 PM [facility]

## 2021-08-23 NOTE — Consult Note (Signed)
Palliative Care Consult Note                                  Date: 08/23/2021   Patient Name: [name]  DOB: [birth year]  MRN: [id number]  Age / Sex: 86 y.o., female  PCP: Sara Macadam, MD Referring Physician: Karie Kirks, DO  Reason for Consultation: Establishing goals of care  HPI/Patient Profile: Palliative Care consult requested for goals of care discussion in this 86 y.o. female  with past medical history of  OSA on CPAP, chronic diastolic CHF, PAF not anticoagulated due to history of GI bleed, ITP, IDA, CKD stage IV, IDDM, depression, and anxiety. She was admitted on 08/20/2021 from home with shortness of breath and somnolence. Chest x-ray with worsening diffuse bilateral airspace disease and small bilateral pleural effusions  Past Medical History:  Diagnosis Date   Allergic rhinitis    Anxiety state, unspecified    panic attacks   CHF (congestive heart failure) (HCC)    Depressive disorder, not elsewhere classified    Disorientation, unspecified 08/07/2019   Epistaxis 03/24/2018   Extrinsic asthma, unspecified    no problem since adulthood   History of complications due to general anesthesia 03/03/2021   History of pulmonary embolism 08/07/2019   Hypertensive urgency 01/30/2021   Obesity    OSA on CPAP    severe   Pneumonitis 07/14/2019   Pressure injury of skin 07/10/2019   Pulmonary embolism ([place])    Pure hypercholesterolemia    Respiratory failure with hypoxia ([place]) 09/2008   acute, secondary to multiple bilateral pulmonary embolism , negative hypercoagulable workup 09/2008 hospital stay   Scoliosis    Type II or unspecified type diabetes mellitus without mention of complication, not stated as uncontrolled    Unspecified hypothyroidism    hypo     Subjective:   This NP [name] reviewed medical records, received report from team, assessed the patient and then met at the patient's bedside with  patient's daughter [name] to discuss diagnosis, prognosis, GOC, EOL wishes disposition and options.   Concept of Palliative Care was introduced as specialized medical care for people and their families living with serious illness.  It focuses on providing relief from the symptoms and stress of a serious illness.  The goal is to improve quality of life for both the patient and the family. Values and goals of care important to patient and family were attempted to be elicited.  [name] is resting on BiPAP. Tolerates fair when off.   Daughter shares patient was referred to Palliative and hospice on a previous admission and the team was later asked to no longer be involved as the medical team felt patient would improve. Which she did.   I Created space and opportunity for daughter to explore state of health prior to admission, thoughts, and feelings. [name] shares patient was living in the home alone prior to admission with hired caregivers. Patient had lived in the home with her previously and seemed to be thriving however, chose to move out and into her own home. On occasions patient has been found to not be taking medications (  context of Her on-going co-morbidities. Natural disease trajectory and expectations were discussed. ° °Daughter verbalized understanding of current illness and co-morbidities. She expresses understanding of concerns with renal function while trying to correct others. We discussed how attempts to improve one area (such as breathing) affects other body systems (kidneys) causing it difficult to maximize and fully manage. [name] verbalized understanding. She expresses her mother is depressed and if you ask her  she is ready to pass away. She has often expressed to her family that all of her friends are deceased in addition to her husband who passed in 2013.  ° °[name] shares that she and her sister are realistic in their understanding of poor long-term prognosis. She is emotional sharing her mother's feelings and actions over the past months. Emotional support provided. Daughter shares their main focus is on patient's respiratory status and to keep her breathing. We discusses at length concerns for AKI and dialysis. Daughter shares if patient would require dialysis this would be considered. I shared concerns that patient most likely would not be a candidate for dialysis given her co-morbidities and multisystem organ failure. She verbalizes understanding expressing she had not thought about if she would not be a candidate if required.  ° °During visit [name] at the bedside providing updates. Also explaining the role of Palliative.  ° °I discussed the importance of continued conversation with family and their medical providers regarding overall plan of care and treatment options, ensuring decisions are within the context of the patients values and GOCs. Encouraged daughter to consider patient's wishes in decision making keeping her quality of life at the heart of all decisions.  ° °Questions and concerns were addressed. The family was encouraged to call with questions or concerns.  PMT will continue to support holistically as needed. ° °Life Review: °Patient lives in the home alone. She has 2 daughters. Is widowed. 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  ° ° °Objective:  ° °Primary Diagnoses: °Present on Admission: ° Acute on chronic respiratory failure with hypoxia and hypercapnia (HCC) ° Sleep apnea ° Physical deconditioning ° Depression, recurrent (HCC) ° Chronic kidney disease (CKD) stage G4/A1, severely decreased glomerular filtration rate (GFR) between 15-29 mL/min/1.73 square meter and  albuminuria creatinine ratio less than 30 mg/g (HCC) ° Chronic ITP (idiopathic thrombocytopenia) (HCC) ° Acute on chronic diastolic CHF (congestive heart failure) (HCC) ° Urinary tract infection, site not specified ° Elevated troponin ° Pressure injury of skin ° ° °Scheduled Meds: ° amLODipine  5 mg Oral Daily  ° atorvastatin  40 mg Oral Daily  ° Chlorhexidine Gluconate Cloth  6 each Topical Daily  ° furosemide  80 mg Intravenous BID  ° heparin  5,000 Units Subcutaneous Q8H  ° hydrALAZINE  25 mg Oral Q8H  ° hyoscyamine  0.125 mg Oral Daily  ° insulin aspart  0-9 Units Subcutaneous Q4H  ° levothyroxine  112 mcg Oral QAC breakfast  ° liver oil-zinc oxide   Topical Daily  ° potassium chloride  40 mEq Oral Once  ° sertraline  100 mg Oral QHS  ° sodium chloride flush  3 mL Intravenous Q12H  ° ° °Continuous Infusions: ° sodium chloride 10 mL/hr at 08/22/21 1752  ° cefTRIAXone (ROCEPHIN)  IV 1 g (08/22/21 1841)  ° ° °PRN Meds: °sodium chloride, acetaminophen **OR** acetaminophen, hydrALAZINE, ondansetron **OR** ondansetron (ZOFRAN) IV, senna-docusate ° °Allergies  °Allergen Reactions  ° Adhesive [Tape] Other (See Comments)  °  PULLS OFF THE   SKIN  ° Apixaban Other (See Comments)  °  Internal Bleeding  ° Aspirin Itching, Rash, Hives and Swelling  °  Swelling of her tongue  ° Mirabegron Other (See Comments)  °  Patient experienced A-Fib  ° Pineapple Anaphylaxis and Swelling  °  Throat swells and blisters on tongue and roof of mouth per patient  ° Metformin Diarrhea and Nausea Only  ° Tetracycline Hives  ° Fluticasone-Salmeterol Itching and Rash  ° Iodinated Diagnostic Agents Itching and Rash  °   °  ° Lactose Intolerance (Gi) Other (See Comments)  °  Gas °  ° Latex Itching, Rash and Other (See Comments)  °  Pulls off the skin and causes welts  ° Lisinopril Cough  ° Metronidazole Other (See Comments)  °  Reaction not known  ° Sulfa Antibiotics Rash  ° Sulfonamide Derivatives Itching and Rash  ° ° °Review of Systems  °Unable to  perform ROS: Acuity of condition  ° ° °Physical Exam °General: NAD, frail chronically-ill appearing °Cardiovascular: bradycardic °Pulmonary: on BiPAP, normal breathing pattern °Neurological: sleeping  ° °Vital Signs:  °BP (!) 145/46 (BP Location: Right Arm)    Pulse (!) 53    [name] 97.7 °F (36.5 °C) (Axillary)    Resp 18    Ht 5' 1" (1.549 m)    Wt 96.7 kg    LMP  (LMP Unknown)    SpO2 91%    BMI 40.28 kg/m²  °Pain Scale: 0-10 °POSS *See Group Information*: S-Acceptable,Sleep, easy to arouse °Pain Score: 0-No pain ° °SpO2: SpO2: 91 % °O2 Device:SpO2: 91 % °O2 Flow Rate: .O2 Flow Rate (L/min): 5 L/min ° °IO: Intake/output summary:  °Intake/Output Summary (Last 24 hours) at 08/23/2021 1215 °Last data filed at 08/23/2021 0730 °Gross per 24 hour  °Intake 0 ml  °Output 1650 ml  °Net -1650 ml  ° ° °LBM: Last BM Date: 08/21/21 °Baseline Weight: Weight: 100.2 kg °Most recent weight: Weight: 96.7 kg     ° °Palliative Assessment/Data: PPS 20-30% ° ° °Advanced Care Planning:  ° °Primary Decision Maker: °HCPOA-daughters as indicated on directives ° °Code Status/Advance Care Planning: °DNR ° °A discussion was had today regarding advanced directives. Concepts specific to code status, artifical feeding and hydration, continued IV antibiotics and rehospitalization was had.   ° °The difference between a aggressive medical intervention path and a palliative comfort care path was discussed. With recommendations for hospice supportive care.  ° °Hospice and Palliative Care services outpatient were explained and offered. Patient and family verbalized their understanding and awareness of both palliative and hospice's goals and philosophy of care.  ° °[name] is clear in expressed wishes to continue to treat the treatable with set limitations for DNR/DNI. They would like to take her care and decisions day by day. Daughter is hopeful the providers has a plan allowing patient to thrive as long as possible.  ° ° °Assessment & Plan:   ° °SUMMARY OF RECOMMENDATIONS   °DNR/DNI-as confirmed by daughter, [name] °Continue with current plan of care. Family clear in expressed goals to continue to treat the treatable. Requesting watchful waiting.  °Recommendations for hospice and closely evaluating patient's wishes and poor prognosis. Daughter expresses understanding and shares experiences with her mother-in-law.  Would like to take things day by day.  °PMT will continue to support and follow as needed. Please call team line with urgent needs. ° °Palliative Prophylaxis:  °Aspiration, Bowel Regimen, Delirium Protocol, Eye Care, Frequent Pain Assessment, and Oral Care ° °Additional Recommendations (  Limitations, Scope, Preferences): °DNR/Treat the treatable ° °Psycho-social/Spiritual:  °Desire for further Chaplaincy support: no °Additional Recommendations: Education on Hospice ° °Prognosis:  °POOR in the setting of BUN 86/Cr 3.09, GFR 14, bradycardia, CKD IV, dCHF, acute on chronic respiratory failure requiring BiPAP.  ° °Discharge Planning:  °To Be Determined  ° ° °Daughter, [name] expressed understanding and was in agreement with this plan.  ° ° °Time Total: 55 min.  ° °Visit consisted of counseling and education dealing with the complex and emotionally intense issues of symptom management and palliative care in the setting of serious and potentially life-threatening illness.Greater than 50%  of this time was spent counseling and coordinating care related to the above assessment and plan. ° °Signed by: ° °Nikki Pickenpack-Cousar, AGPCNP-BC °Palliative Medicine Team  °Phone: [phone] °Pager: [phone] °Amion: N. Cousar  ° °Thank you for allowing the Palliative Medicine Team to assist in the care of this patient. Please utilize secure chat with additional questions, if there is no response within 30 minutes please call the above phone number. Palliative Medicine Team providers are available by phone from 7am to 5pm daily and can be reached  through the team cell phone.  °Should this patient require assistance outside of these hours, please call the patient's attending physician. ° ° ° ° ° ° ° ° ° ° ° ° °  °

## 2021-08-23 NOTE — Progress Notes (Signed)
Patient taking of bipap again, placed on 5liters oxygen.

## 2021-08-23 NOTE — Progress Notes (Signed)
Progress Note  Patient Name: [name] Date of Encounter: 08/23/2021  [facility] Cardiologist: Buford Dresser, MD   Subjective   In interval has had some time off BIPAP.  Has some coffee and a banana. Has had and good output.    Inpatient Medications    Scheduled Meds:  amLODipine  5 mg Oral Daily   atorvastatin  40 mg Oral Daily   Chlorhexidine Gluconate Cloth  6 each Topical Daily   heparin  5,000 Units Subcutaneous Q8H   hyoscyamine  0.125 mg Oral Daily   insulin aspart  0-9 Units Subcutaneous Q4H   levothyroxine  112 mcg Oral QAC breakfast   liver oil-zinc oxide   Topical Daily   potassium chloride  40 mEq Oral Once   sertraline  100 mg Oral QHS   sodium chloride flush  3 mL Intravenous Q12H   Continuous Infusions:  sodium chloride 10 mL/hr at 08/22/21 1752   cefTRIAXone (ROCEPHIN)  IV 1 g (08/22/21 1841)   PRN Meds: sodium chloride, acetaminophen **OR** acetaminophen, hydrALAZINE, ondansetron **OR** ondansetron (ZOFRAN) IV, senna-docusate   Vital Signs    Vitals:   08/23/21 0011 08/23/21 0102 08/23/21 0421 08/23/21 0823  BP: (!) 159/47 (!) 157/46 (!) 166/51 (!) 157/43  Pulse:   (!) 55 (!) 52  Resp: (!) 21 17 (!) 22 17  Temp:  98.2 F (36.8 C) 98.4 F (36.9 C) 97.9 F (36.6 C)  TempSrc:  Axillary Oral Axillary  SpO2:  99% 98% 99%  Weight:  96.7 kg    Height:        Intake/Output Summary (Last 24 hours) at 08/23/2021 0958 Last data filed at 08/23/2021 0730 Gross per 24 hour  Intake 0 ml  Output 1850 ml  Net -1850 ml   Last 3 Weights 08/23/2021 08/22/2021 08/21/2021  Weight (lbs) 213 lb 3 oz 217 lb 6 oz 217 lb 6 oz  Weight (kg) 96.7 kg 98.6 kg 98.6 kg      Telemetry    Sinus rhythm to sinus bradycardia - Personally Reviewed  ECG    No new ECG - Personally Reviewed  Physical Exam   GEN: No distress on BIPAP 20/8 Neck: JVD to mid neck with low V wave nader Cardiac: bradycardic, regular, holosystolic 2/6 murmur  murmurs, rubs, or gallops.  Respiratory: On BiPAP, diminished at bases GI: Soft, nontender, non-distended  MS: No edema; No deformity. Neuro:  Nonfocal  Psych: Normal affect   Labs    High Sensitivity Troponin:   Recent Labs  Lab 08/20/21 1802 08/20/21 2005 08/21/21 0838 08/21/21 1055  TROPONINIHS 31* 37* 69* 59*     Chemistry Recent Labs  Lab 08/20/21 1142 08/20/21 1142 08/20/21 1802 08/20/21 1911 08/21/21 0348 08/21/21 1500 08/22/21 0148 08/23/21 0222  NA 143  --  141   < > 143 145 144 145  K 4.4  --  4.0   < > 4.2 4.1 3.9 3.6  CL 99  --  99  --  101  --  102 100  CO2 38*  --  31  --  33*  --  32 35*  GLUCOSE 117*  --  209*  --  105*  --  142* 157*  BUN 73*  --  71*  --  74*  --  81* 86*  CREATININE 2.35*  --  2.34*  --  2.73*  --  2.82* 3.09*  CALCIUM 9.7  --  9.3  --  8.9  --  9.0  9.0  MG  --   --   --   --  2.1  --   --   --   PROT 7.3  --  7.5  --   --   --   --   --   ALBUMIN 3.8  --  3.6  --   --   --   --   --   AST 10  --  16  --   --   --   --   --   ALT 9  --  16  --   --   --   --   --   ALKPHOS 76  --  83  --   --   --   --   --   BILITOT 0.7  --  1.0  --   --   --   --   --   GFRNONAA  --    < > 20*  --  17*  --  16* 14*  ANIONGAP  --    < > 11  --  9  --  10 10   < > = values in this interval not displayed.    Lipids No results for input(s): CHOL, TRIG, HDL, LABVLDL, LDLCALC, CHOLHDL in the last 168 hours.  Hematology Recent Labs  Lab 08/21/21 0348 08/21/21 1500 08/22/21 0148 08/23/21 0222  WBC 6.4  --  9.1 8.5  RBC 2.97*  --  2.80* 2.68*  HGB 9.2* 9.5* 8.7* 8.5*  HCT 29.6* 28.0* 27.4* 26.7*  MCV 99.7  --  97.9 99.6  MCH 31.0  --  31.1 31.7  MCHC 31.1  --  31.8 31.8  RDW 16.3*  --  16.6* 16.9*  PLT 128*  --  128* 133*   Thyroid  Recent Labs  Lab 08/20/21 1142  TSH 0.53    BNP Recent Labs  Lab 08/20/21 1142 08/20/21 2005  BNP  --  237.9*  PROBNP 279.0*  --     DDimer No results for input(s): DDIMER in the last 168 hours.    Radiology    US RENAL  Result Date: 08/21/2021 CLINICAL DATA:  Acute kidney injury. EXAM: RENAL / URINARY TRACT ULTRASOUND COMPLETE COMPARISON:  CT abdomen and pelvis 09/28/2019. Right upper quadrant abdominal ultrasound 09/28/2019. Renal ultrasound 07/29/2019. FINDINGS: Right Kidney: Renal measurements: 11.0 x 5.7 x 5.8 cm = volume: 188 mL. Renal cortical thinning and mildly increased parenchymal echogenicity. No mass or hydronephrosis. Left Kidney: Renal measurements: 10.6 x 6.2 x 6.6 cm = volume: 226 mL. Overall poor visualization of the left kidney due to body habitus. Renal cortical thinning and mildly increased parenchymal echogenicity. 2.5 cm cyst. No hydronephrosis. Bladder: Decompressed by Foley catheter. Other: Stones in the gallbladder measuring up to 1.5 cm. Additional complex material filling the gallbladder containing multiple septations, new from 09/28/2019. IMPRESSION: 1. Bilateral renal cortical thinning and increased parenchymal echogenicity compatible with medical renal disease. No hydronephrosis. 2. Cholelithiasis. 3. New complex material in the gallbladder with multiple septations, indeterminate. Considerations include complex sludge, membranous cholecystitis, sequelae of chronic inflammation, hemorrhage, and cystic tumor. Correlate for clinical signs of acute cholecystitis and consider abdominal MRI without and with contrast for further evaluation. Electronically Signed   By: Logan Bores M.D.   On: 08/21/2021 17:11   DG CHEST PORT 1 VIEW  Result Date: 08/23/2021 CLINICAL DATA:  Short of breath. EXAM: PORTABLE CHEST 1 VIEW COMPARISON:  08/22/2021 and older exams. FINDINGS: Right greater than  left patchy airspace lung opacities, and more confluent bilateral lung base opacities, are unchanged. No new lung abnormalities. No pneumothorax. IMPRESSION: 1. No significant interval change in the bilateral airspace lung opacities. The more confluent opacities at the lung bases are  consistent with a combination of the lung opacities and bilateral pleural effusions. Lung findings may reflect bilateral pneumonia or pulmonary edema, the latter favored. Electronically Signed   By: Lajean Manes M.D.   On: 08/23/2021 09:14   DG CHEST PORT 1 VIEW  Result Date: 08/22/2021 CLINICAL DATA:  Hypoxia. EXAM: PORTABLE CHEST 1 VIEW COMPARISON:  08/20/2021 FINDINGS: Persistent bilateral airspace disease, right side slightly greater than left. Cardiac silhouette remains prominent for size but obscured by the lung densities. Basilar chest densities could represent bilateral pleural effusions, right side greater than left. IMPRESSION: 1. No significant change in the bilateral airspace disease. Differential diagnosis includes pneumonia and/or pulmonary edema. 2. Probable right pleural effusion. Electronically Signed   By: Markus Daft M.D.   On: 08/22/2021 13:03   ECHOCARDIOGRAM LIMITED  Result Date: 08/22/2021    ECHOCARDIOGRAM LIMITED REPORT   Patient Name:   AYVAH CAROLL [facility] Date of Exam: 08/22/2021 Medical Rec #:  [id number]             Height:       61.0 in Accession #:    [id number]            Weight:       217.4 lb Date of Birth:  1935/03/04            BSA:          1.957 m Patient Age:    16 years              BP:           150/51 mmHg Patient Gender: F                     HR:           53 bpm. Exam Location:  Inpatient Procedure: Limited Echo, Limited Color Doppler and Cardiac Doppler Indications:    Congestive Heart Failure I50.9  History:        Patient has prior history of Echocardiogram examinations, most                 recent 01/31/2021. Risk Factors:Hypertension, Diabetes and                 Dyslipidemia. Respiratory failure with hypoxia.  Sonographer:    Darlina Sicilian RDCS Referring Phys: [phone] HAO MENG  Sonographer Comments: Image acquisition challenging due to respiratory motion, patient on BiPap IMPRESSIONS  1. Left ventricular ejection fraction, by estimation, is 60 to 65%. The  left ventricle has normal function. The left ventricle has no regional wall motion abnormalities. There is moderate left ventricular hypertrophy. Left ventricular diastolic parameters are indeterminate.  2. Right ventricular systolic function is normal. The right ventricular size is mildly enlarged. Tricuspid regurgitation signal is inadequate for assessing PA pressure.  3. The mitral valve is grossly normal. No evidence of mitral valve regurgitation.  4. The inferior vena cava is dilated in size with >50% respiratory variability, suggesting right atrial pressure of 8 mmHg. Conclusion(s)/Recommendation(s): No significant changes from prior echo 01/31/2021. FINDINGS  Left Ventricle: Left ventricular ejection fraction, by estimation, is 60 to 65%. The left ventricle has normal function. The left ventricle has no regional wall motion abnormalities. The left ventricular internal cavity  size was small. There is moderate  left ventricular hypertrophy. Left ventricular diastolic parameters are indeterminate. Right Ventricle: The right ventricular size is mildly enlarged. No increase in right ventricular wall thickness. Right ventricular systolic function is normal. Tricuspid regurgitation signal is inadequate for assessing PA pressure. Mitral Valve: The mitral valve is grossly normal. There is mild calcification of the mitral valve leaflet(s). Pulmonic Valve: The pulmonic valve was not well visualized. Venous: The inferior vena cava is dilated in size with greater than 50% respiratory variability, suggesting right atrial pressure of 8 mmHg. LEFT VENTRICLE PLAX 2D LVIDd:         4.40 cm   Diastology LVIDs:         2.20 cm   LV e' medial:    3.99 cm/s LV PW:         1.30 cm   LV E/e' medial:  34.3 LV IVS:        1.40 cm   LV e' lateral:   5.15 cm/s LVOT diam:     2.00 cm   LV E/e' lateral: 26.6 LV SV:         68 LV SV Index:   35 LVOT Area:     3.14 cm  LEFT ATRIUM         Index LA diam:    4.00 cm 2.04 cm/m  AORTIC VALVE  LVOT Vmax:   82.80 cm/s LVOT Vmean:  49.000 cm/s LVOT VTI:    0.218 m MITRAL VALVE MV Area (PHT): 3.51 cm     SHUNTS MV Decel Time: 216 msec     Systemic VTI:  0.22 m MV E velocity: 137.00 cm/s  Systemic Diam: 2.00 cm MV A velocity: 36.50 cm/s MV E/A ratio:  3.75 Landscape architect signed by Phineas Inches Signature Date/Time: 08/22/2021/2:04:12 PM    Final      Patient Profile     86 y.o. female with chronic diastolic heart failure, PAF, chronic thrombocytopenia followed by Dr. Marin Olp, HTN, HLD, DM II, chronic respiratory failure on home O2, CKD stage IV, hypothyroidism and OSA not compliant with CPAP who is being seen  for the evaluation of acute on chronic diastolic heart failure  Assessment & Plan    Acute on chronic respiratory failure with hypoxia and hypercapnia. OHS and OSA Acute on chronic diastolic heart failure AKI on CKD stage IV HTN with DM -Continue IV Lasix.  80 IV with 2 more dose and BMP in AM, CTZ stopped -Strict I's and O's and daily weights - BP still elevated on amlodpine (return), adding hydaralazine 25 mg PO TID - has multiple comorbidity- DNR; agree with palliative consult; discussed this with patient in regard to AKI - no WMA on echo  PAF Chronic thrombocytopenia - prior cardiologist discussed with patient, no plans for [facility] in the setting     Hypothyroidism: TSH normal   History of junctional bradycardia:  - Previously has been evaluated by EP, junctional rhythm versus A. fib with junctional escape.   - Currently in sinus bradycardia but intermittently in junctional rhythm - no urgent PPM needs  For questions or updates, please contact [place] Please consult [email or web address] for contact info under        Signed, Werner Lean, MD  08/23/2021, 9:58 AM

## 2021-08-23 NOTE — Evaluation (Signed)
Physical Therapy Evaluation Patient Details Name: [name] MRN: [id number] DOB: 02-Jun-1935 Today's Date: 08/23/2021  History of Present Illness  Pt is an 86 y.o. female admitted 12/14 for acute on chronic respiratory failure with hypoxia and hypercapnia. PMH: OSA on CPAP, chronic diastolic CHF, PAF not anticoagulated due to history of GI bleed, ITP, IDA, CKD stage IV, IDDM, depression, and anxiety   Clinical Impression  Pt admitted with above diagnosis. PTA pt lived at home using rollator for household ambulation. She has a PCA nearly 24 hours/day (2 hour gap in AM and 2 hour gap in PM) to assist with ADL/iADLs. On eval, pt required mod assist rolling, max assist supine to sit, and +2 max assist sit to supine. She demonstrated fair sitting balance EOB x 10 minutes. Pt removed from bipap earlier this morning and on 5L O2 at time of eval. SpO2 92% in bed prior to mobility. SpO2 maintained around 88% sitting EOB. Upon return to bed, pt with BM requiring rolling R/L for cleaning and pad change. Pt then with desat to 80%. HOB elevated. Pt only able to recover to 85% on 5L after 5-10 minutes. Pt currently with functional limitations due to the deficits listed below (see PT Problem List). Pt will benefit from skilled PT to increase their independence and safety with mobility to allow discharge to the venue listed below.          Recommendations for follow up therapy are one component of a multi-disciplinary discharge planning process, led by the attending physician.  Recommendations may be updated based on patient status, additional functional criteria and insurance authorization.  Follow Up Recommendations Skilled nursing-short term rehab (<3 hours/day)    Assistance Recommended at Discharge Frequent or constant Supervision/Assistance  Functional Status Assessment Patient has had a recent decline in their functional status and demonstrates the ability to make significant improvements in  function in a reasonable and predictable amount of time.  Equipment Recommendations  None recommended by PT    Recommendations for Other Services       Precautions / Restrictions Precautions Precautions: Fall;Other (comment) Precaution Comments: watch sats      Mobility  Bed Mobility Overal bed mobility: Needs Assistance Bed Mobility: Rolling;Supine to Sit;Sit to Supine Rolling: Mod assist   Supine to sit: Max assist;HOB elevated Sit to supine: +2 for physical assistance;Max assist   General bed mobility comments: +rail, increased time, cues for sequencing, assist for all aspects of mobility    Transfers                   General transfer comment: unable to progress beyond EOB due to fatigue, desat, increased WOB    Ambulation/Gait               General Gait Details: unable  Stairs            Wheelchair Mobility    Modified Rankin (Stroke Patients Only)       Balance Overall balance assessment: Needs assistance Sitting-balance support: Feet supported;Bilateral upper extremity supported Sitting balance-Leahy Scale: Fair                                       Pertinent Vitals/Pain Pain Assessment: Faces Faces Pain Scale: Hurts little more Pain Location: bilat feet/ankles Pain Descriptors / Indicators: Tender;Sore Pain Intervention(s): Limited activity within patient's tolerance;Repositioned;Monitored during session    Home Living Family/patient expects  to be discharged to:: Private residence Living Arrangements: Alone Available Help at Discharge: Family;Personal care attendant;Available 24 hours/day (nearly 24 hour (gap 8-10am and 6-8pm), family frequently assists during PM gap time) Type of Home: House Home Access: Stairs to enter Entrance Stairs-Rails: Right;Left;Can reach both Entrance Stairs-Number of Steps: 3   Home Layout: One level Home Equipment: IT sales professional (4 wheels);Rolling Walker (2  wheels);Shower seat - built in      Prior Function Prior Level of Function : Needs assist             Mobility Comments: mod I mobility using rollator in house and RW vs wheelchair in community ADLs Comments: PCA assists with meals, housekeeping, dressing, and bathing     Hand Dominance   Dominant Hand: Right    Extremity/Trunk Assessment   Upper Extremity Assessment Upper Extremity Assessment: Generalized weakness    Lower Extremity Assessment Lower Extremity Assessment: Generalized weakness    Cervical / Trunk Assessment Cervical / Trunk Assessment: Kyphotic  Communication   Communication: No difficulties  Cognition Arousal/Alertness: Awake/alert Behavior During Therapy: Flat affect;Anxious Overall Cognitive Status: Within Functional Limits for tasks assessed                                          General Comments General comments (skin integrity, edema, etc.): Pt removed from bipap this AM and on 5L continuous O2 at time of eval. SpO2 92% at rest. Desat to 80% during activity. Only able to recover to 85% upon return to bed. Increased WOB. Pt requesting return to bipap. RN notified.    Exercises     Assessment/Plan    PT Assessment Patient needs continued PT services  PT Problem List Decreased strength;Decreased mobility;Obesity;Decreased activity tolerance;Cardiopulmonary status limiting activity;Pain;Decreased balance       PT Treatment Interventions Therapeutic activities;Gait training;Therapeutic exercise;Patient/family education;Balance training;Functional mobility training    PT Goals (Current goals can be found in the Care Plan section)  Acute Rehab PT Goals Patient Stated Goal: breathe better PT Goal Formulation: With patient Time For Goal Achievement: 09/06/21 Potential to Achieve Goals: Fair    Frequency Min 3X/week   Barriers to discharge        Co-evaluation               AM-PAC PT "6 Clicks" Mobility   Outcome Measure Help needed turning from your back to your side while in a flat bed without using bedrails?: A Lot Help needed moving from lying on your back to sitting on the side of a flat bed without using bedrails?: Total Help needed moving to and from a bed to a chair (including a wheelchair)?: Total Help needed standing up from a chair using your arms (e.g., wheelchair or bedside chair)?: Total Help needed to walk in hospital room?: Total Help needed climbing 3-5 steps with a railing? : Total 6 Click Score: 7    End of Session Equipment Utilized During Treatment: Oxygen Activity Tolerance: Patient limited by fatigue;Treatment limited secondary to medical complications (Comment) (desat, increased WOB, DOE) Patient left: in bed;with call bell/phone within reach;with family/visitor present Nurse Communication: Mobility status;Other (comment) (SpO2 85%. Pt requesting bipap.) PT Visit Diagnosis: Other abnormalities of gait and mobility (R26.89);Muscle weakness (generalized) (M62.81)    Time: 0630-1601 PT Time Calculation (min) (ACUTE ONLY): 42 min   Charges:   PT Evaluation $PT Eval Moderate Complexity: 1 Mod PT  Treatments $Therapeutic Activity: 23-37 mins        Lorrin Goodell, [place]  Office # [phone] Pager [phone]   Lorriane Shire 08/23/2021, 10:41 AM

## 2021-08-23 NOTE — Progress Notes (Addendum)
PROGRESS NOTE  [name] QIW:[id number] DOB: Jul 29, 1935 DOA: 08/20/2021 PCP: Caren Macadam, MD  Brief History   [name] is a pleasant 86 y.o. female with medical history significant for OSA on CPAP, chronic diastolic CHF, PAF not anticoagulated due to history of GI bleed, ITP, IDA, CKD stage IV, IDDM, depression, and anxiety, now presenting to the emergency department with worsening shortness of breath and somnolence.  The patient is accompanied by her daughter who assists with the history.  Patient lives alone but has daughters who check on her frequently and have noted that the patient has become progressively short of breath over the past week or more and has been progressively fatigued and now somnolent.  She had increased leg swelling and reported a sensation of fullness around her abdomen, had her diuretic increased 2 days ago and lost 7 pounds since then but continued to have increased shortness of breath and somnolence.  She uses a walker at baseline.  For the past month has wanted family to put her in a wheelchair, had not been taking her diuretic consistently due to not wanting to have to go to the restroom, and had been urinating on herself.  The patient had been seen by pulmonology, PCP, and oncology this week prior to presentation in the ED.    ED Course: Upon arrival to the ED, patient is found to be afebrile, saturating 86% on 6 L/min of supplemental oxygen, and with initial SBP of 215.  EKG features accelerated junctional rhythm.  Chest x-ray with worsening diffuse bilateral airspace disease and small bilateral pleural effusions.  Chemistry panel notable for glucose 209 and creatinine 2.34.  CBC with hemoglobin 10.7 and MCV 102.9.  ABG with pH 2.67 and pCO2 80.2.  Patient was placed on BiPAP and treated with IV Lasix, Rocephin, and briefly with nitroglycerin infusion in the ED.  The patient was found to have a UTI. She was started on Rocephin.   [facility] were consulted to admit the patient for further evaluation and treatment.  The patient is admitted as inpatient to a progressive bed on BIPAP.  Her troponin upon arrival was 31. The next troponin was 37. When it was rechecked this am it was 20. EKG demonstrated a junctional rhythm. Cardiology was consulted.   The patient remains on BIPAP. She is saturating at 95% on 40% bleed in with BIPAP. ABG has been ordered to follow her respiratory acidosis.  She is a DNR  Pulmonology has been consulted. They have recommended continued efforts to diurese the patient and to wean off of BIPAP.   Consultants  Cardiology Pulmonology  Procedures  None  Antibiotics   Anti-infectives (From admission, onward)    Start     Dose/Rate Route Frequency Ordered Stop   08/21/21 1800  cefTRIAXone (ROCEPHIN) 1 g in sodium chloride 0.9 % 100 mL IVPB        1 g 200 mL/hr over 30 Minutes Intravenous Every 24 hours 08/21/21 0013 08/25/21 1759   08/20/21 2030  cefTRIAXone (ROCEPHIN) 1 g in sodium chloride 0.9 % 100 mL IVPB        1 g 200 mL/hr over 30 Minutes Intravenous  Once 08/20/21 2019 08/20/21 2121       Subjective  The patient is resting comfortably on BIPAP. She is sleeping. Her daughter is at bedside.  Objective   Vitals:  Vitals:   08/23/21 1118 08/23/21 1410  BP: (!) 145/46 (!) 144/51  Pulse: (!) 53  Resp: 18   Temp: 97.7 F (36.5 C)   SpO2: 91%     Exam:  Constitutional:  The patient is sleeping. She is not awakened. She is on BIPAP. No acute distress. Respiratory:  Pt is on BIPAP. Positive for scattered rales throughout.  No wheezes or rhonchi are appreciated. No tactile fremitus Cardiovascular:  Regular rate and rhythm No murmurs, ectopy, or gallups. No lateral PMI. No thrills. Abdomen:  Abdomen is soft, non-tender, non-distended No hernias, masses, or organomegaly Normoactive bowel sounds.  Musculoskeletal:  No cyanosis or clubbing 2+ pitting edema to  lower extremities bilaterally Skin:  No rashes, lesions, ulcers palpation of skin: no induration or nodules Neurologic:  CN 2-12 intact Patient is moving all extremities. Speech fluent Psychiatric:  Mental status Mood, affect appropriate Orientation to person, place, time  judgment and insight appear intact I have personally reviewed the following:   Today's Data  Vitals  Lab Data  CBC BMP  Micro Data  Urine culture  Imaging  CXR  Cardiology Data  Troponin EKG  Scheduled Meds:  amLODipine  5 mg Oral Daily   atorvastatin  40 mg Oral Daily   Chlorhexidine Gluconate Cloth  6 each Topical Daily   furosemide  80 mg Intravenous BID   heparin  5,000 Units Subcutaneous Q8H   hydrALAZINE  25 mg Oral Q8H   hyoscyamine  0.125 mg Oral Daily   insulin aspart  0-9 Units Subcutaneous Q4H   levothyroxine  112 mcg Oral QAC breakfast   liver oil-zinc oxide   Topical Daily   potassium chloride  40 mEq Oral Once   sertraline  100 mg Oral QHS   sodium chloride flush  3 mL Intravenous Q12H   Continuous Infusions:  sodium chloride 10 mL/hr at 08/22/21 1752   cefTRIAXone (ROCEPHIN)  IV 1 g (08/22/21 1841)    Principal Problem:   Acute on chronic respiratory failure with hypoxia and hypercapnia (HCC) Active Problems:   DM type 2 (diabetes mellitus, type 2) (HCC)   Depression, recurrent (HCC)   Sleep apnea   Chronic kidney disease (CKD) stage G4/A1, severely decreased glomerular filtration rate (GFR) between 15-29 mL/min/1.73 square meter and albuminuria creatinine ratio less than 30 mg/g (HCC)   Chronic ITP (idiopathic thrombocytopenia) (HCC)   Acute on chronic diastolic CHF (congestive heart failure) (HCC)   Physical deconditioning   Urinary tract infection, site not specified   Elevated troponin   Pressure injury of skin    LOS: 3 days    A & P  Chronic ITP (idiopathic thrombocytopenia) (HCC) Platelets are stable at 128. They have ranged from 107 to 140 in the past 60  days. NPlate was not given on her most recent visit to Oncology, although she did receive an iron infusion or on admission. Monitor.  Chronic kidney disease (CKD) stage G4/A1, severely decreased glomerular filtration rate (GFR) between 15-29 mL/min/1.73 square meter and albuminuria creatinine ratio less than 30 mg/g (HCC) Pt has AKI on CKD G4A1 with severely decreased filtration rate (15-29 mL/min/1.73 msquared) with albumin/creatinine ration less than 30 mg/g. Creatinine is increased from baseline of 1.80 to 2.83. Will monitor creatinine, electrolytes and volume status. Avoid nephrotoxins and hypotension. Will check renal ultrasound.  Depression, recurrent ([place]) Continue Zoloft as at home.  DM type 2 (diabetes mellitus, type 2) ([place]) At home the patient's glucoses have been controlled with Lantus 46 Units qhs, Trulicity 1.70 mg Qwk, Lispro prn meals. Will follow glucoses with FSBS and SSI for now. Glucose  upon admission was 105. Will also check HbA1c. Glucoses have run from 101 to 166.  Physical deconditioning Noted. Will have PT/OT eval and treat when appropriate.  Sleep apnea Pt uses CPAP at home, but states that she likes BIPAP better. Perhaps can get BIPAP for home use.  Urinary tract infection, site not specified Urine culture is pending. Patient has been started on Rocephin.  Acute on chronic diastolic CHF (congestive heart failure) (HCC) Echocardiogram from 01/2021 demonstrated an EF of 60-65%. Diastolic function could not be evaluated on that study. Echo from 06/2019 demonstrated an EF of 50-55% with Grade 1 diastolic function. Will order another echocardiogram. Cardiology has been consulted. Continue to diurese as possible. Lasix has been icnreased to 80 mg IV Q 9.  Acute on chronic respiratory failure with hypoxia and hypercapnia (HCC) Pt presented in ED with hypoxic and hypercapneic respiratory failure. She was placed on BIPAP. ABG repeated this afternoon demonstrated resolution of  the respiratory acidosis. BIPAP has been removed. I will recheck ABG in 3-4 hours to evaluate her ventilation. Pulmonology has been consulted. They have recommended continued diuresis and attempts to wean.  Elevated troponin Initial troponin 31, then 37, then 69. EKG demonstrates a junctional rhythm. Cardiology has been consulted. I appreciate their help.  I have seen and examined this patient myself. I have spent 32 minutes in her evaluation and care.  DVT prophylaxis: sq heparin  Code Status: DNR, confirmed with daughter on admission  Level of Care: Level of care: Progressive Family Communication: Daughter at bedside  Disposition Plan:  Patient is from: home  Anticipated d/c is to: TBD Anticipated d/c date is: 08/24/21 Patient currently: Pending improvement in respiratory status  Consults called: Cardiology Admission status: Inpatient    Sara Collister, DO [facility] Direct contact: see [email or web address]  7PM-7AM contact night coverage as above 08/22/2021, 3:47 PM  LOS: 1 day  ADDENDUM: Renal ultrasound performed on 08/21/2021 demonstrated evidence of medical renal disease and no hydronephrosis. GB was also mentioned and there was cholelithiasis as well as complex sludge, membranous cholecystitis, sequelae of chronic inflammation, hemorrhage and cystic tumor. Will follow up with abdominal MRI with and without contrast as recommended. Pending those results will consult general surgery.

## 2021-08-23 NOTE — Progress Notes (Signed)
Patient taking off bipap by respiratory therapist and RN,placed on 5L [place] sating at 96%.

## 2021-08-23 NOTE — Evaluation (Signed)
Occupational Therapy Evaluation Patient Details Name: [name] MRN: [id number] DOB: 02/01/1935 Today's Date: 08/23/2021   History of Present Illness Pt is an 86 y.o. female admitted 12/14 for acute on chronic respiratory failure with hypoxia and hypercapnia. PMH: OSA on CPAP, chronic diastolic CHF, PAF not anticoagulated due to history of GI bleed, ITP, IDA, CKD stage IV, IDDM, depression, and anxiety   Clinical Impression   Prior to hospitalization, pt was living alone in a 1-level house with 3 STE. Pt received S/A from family and [place] caregiver almost 24/7. Pt performed functional mobility/transfers in house with mod I using a rollator. Pt reports using a rollator in the community typically, however over the last few weeks has needed to use the wheelchair for functional mobility 2/2 weakness. Pt performed her basic ADLs with mod I-supervision but required increased S/A for IADLs.   Today, pt received semi-reclined in bed, daughter present, pt agreeable to OT eval with encouragement. Per nurse, pt recently taken off BiPAP machine and placed on 5L of O2. Pt's SPO2 fluctuating between high 80s and low 90s throughout session. OT explained the importance of functional movement and weaning off supplemental oxygen to gain activity tolerance/endurance. Pt declined EOB this afternoon 2/2 DOE at bed level, will further assess pt's ADL function at EOB in f/u treatments. At bed level, pt requires increased assistance for bed mobility, max-total assist for LB self-care, and mod assist-mod I for UB self-care. Educated pt/daughter on PLB, activity pacing, brief BUE/BLE HEP at bed level, importance of participating in ADLs to gain strength/endurance, optimal positioning for extremities/breathing, and process of acute care OT/beyond. Pt will likely have 24/7 S/A available upon d/c per daughter. Pt would benefit from continued skilled acute care OT services to maximize independence with ADLs/ADL mobility.  Will continue to follow pt acutely.       Recommendations for follow up therapy are one component of a multi-disciplinary discharge planning process, led by the attending physician.  Recommendations may be updated based on patient status, additional functional criteria and insurance authorization.   Follow Up Recommendations  Skilled nursing-short term rehab (<3 hours/day)    Assistance Recommended at Discharge Frequent or constant Supervision/Assistance  Functional Status Assessment  Patient has had a recent decline in their functional status and demonstrates the ability to make significant improvements in function in a reasonable and predictable amount of time.  Equipment Recommendations  Other (comment) (will continue to assess)    Recommendations for Other Services Other (comment) (None)     Precautions / Restrictions Precautions Precautions: Fall;Other (comment) Precaution Comments: watch sats Restrictions Weight Bearing Restrictions: No      Mobility Bed Mobility     General bed mobility comments: not assessed but will likely required increased assistance x2 for EOB per PT note    Transfers   General transfer comment: not assessed this date          ADL either performed or assessed with clinical judgement   ADL Overall ADL's : Needs assistance/impaired Eating/Feeding: Set up;Modified independent;Bed level Eating/Feeding Details (indicate cue type and reason): drinking tea semi-reclined in bed Grooming: Wash/dry face;Oral care;Brushing hair;Set up;Modified independent;Bed level Grooming Details (indicate cue type and reason): semi-reclined in bed to wash face, brush teeth, and comb hair Upper Body Bathing: Moderate assistance;Bed level Upper Body Bathing Details (indicate cue type and reason): simulated at bed level Lower Body Bathing: Maximal assistance;Bed level Lower Body Bathing Details (indicate cue type and reason): simulated at bed level Upper Body  Dressing : Bed level;Moderate assistance Upper Body Dressing Details (indicate cue type and reason): simulated gown management at bed level Lower Body Dressing: Total assistance;Bed level Lower Body Dressing Details (indicate cue type and reason): total assist for sock management at bed level semi-reclined Toilet Transfer:  (N/A, pt refusing EOB 2/2 SOB) Toilet Transfer Details (indicate cue type and reason): N/A this session Toileting- Clothing Manipulation and Hygiene: Total assistance;Bed level Toileting - Clothing Manipulation Details (indicate cue type and reason): purewick in place Tub/ Shower Transfer:  (N/A)     General ADL Comments: anticipate increased assistance with sitting EOB however pt deferred EOB 2/2 DOE in semi-reclined position, SPO2 dropped this morning with PT at EOB     Vision Baseline Vision/History: 0 No visual deficits Ability to See in Adequate Light: 0 Adequate Patient Visual Report: No change from baseline Vision Assessment?: No apparent visual deficits            Pertinent Vitals/Pain Pain Assessment: 0-10 Pain Score: 8  Faces Pain Scale: Hurts little more Pain Location: bilat feet/ankles Pain Descriptors / Indicators: Other (Comment) (stiff) Pain Intervention(s): Monitored during session;Repositioned     Hand Dominance Right   Extremity/Trunk Assessment Upper Extremity Assessment Upper Extremity Assessment: Generalized weakness   Lower Extremity Assessment Lower Extremity Assessment: Generalized weakness   Cervical / Trunk Assessment Cervical / Trunk Assessment: Kyphotic   Communication Communication Communication: No difficulties   Cognition Arousal/Alertness: Awake/alert Behavior During Therapy: WFL for tasks assessed/performed Overall Cognitive Status: Within Functional Limits for tasks assessed     General Comments: Ox4     General Comments  pt on 5L of O2, nurse recently removed BiPAP, OT educated pt on PLB, skin intact, edema  improving to [place] expects to be discharged to:: Private residence Living Arrangements: Alone Available Help at Discharge: Family;Personal care attendant;Available 24 hours/day ([place] aide and family) Type of Home: House Home Access: Stairs to enter [employer] of Steps: 3 Entrance Stairs-Rails: Right;Left;Can reach both Home Layout: One level     Bathroom Shower/Tub: Occupational psychologist: Handicapped height Bathroom Accessibility: Yes How Accessible: Accessible via walker [place]: Wheelchair - [place] (4 wheels);Rolling Walker (2 wheels);Shower seat - built in;Other (comment) (possibly BSC)      Prior Functioning/Environment Prior Level of Function : Needs assist     Mobility Comments: mod I mobility using rollator in house and RW vs wheelchair in community, w/c in community over last few weeks 2/2 weakness ADLs Comments: PCA assists with meals, housekeeping, dressing, and bathing; independent with meds    OT Problem List: Cardiopulmonary status limiting activity;Decreased strength;Decreased activity tolerance;Impaired balance (sitting and/or standing);Pain;Increased edema      OT Treatment/Interventions: Self-care/ADL training;Therapeutic exercise;Energy conservation;DME and/or AE instruction;Therapeutic activities;Patient/family education    OT Goals(Current goals can be found in the care plan section) Acute Rehab OT Goals Patient Stated Goal: to re-apply BiPAP and return home to being independent with ADLs OT Goal Formulation: With patient/family Time For Goal Achievement: 09/06/21 Potential to Achieve Goals: Fair  OT Frequency: Min 2X/week    AM-PAC OT "6 Clicks" Daily Activity     Outcome Measure Help from another person eating meals?: A Little Help from another person taking care of personal grooming?: A Little Help from another person toileting, which includes using toliet, bedpan, or  urinal?: Total Help from another person bathing (including washing, rinsing, drying)?: A Lot Help from another person  to put on and taking off regular upper body clothing?: A Lot Help from another person to put on and taking off regular lower body clothing?: Total 6 Click Score: 12   End of Session Equipment Utilized During Treatment: Oxygen Nurse Communication: Mobility status;Other (comment) (pt's desire for BiPAP)  Activity Tolerance: Patient limited by pain;Other (comment) (pt limited by SOB on 5L of O2) Patient left: in bed;with call bell/phone within reach;with family/visitor present  OT Visit Diagnosis: Muscle weakness (generalized) (M62.81);Pain                Time: 1430-1516 OT Time Calculation (min): 46 min Charges:  OT General Charges $OT Visit: 1 Visit OT Evaluation $OT Eval Moderate Complexity: 1 Mod OT Treatments $Self Care/Home Management : 8-22 mins  Michel Bickers, OTR/L [facility] [phone]  Francesca Jewett 08/23/2021, 3:35 PM

## 2021-08-23 NOTE — Evaluation (Signed)
Clinical/Bedside Swallow Evaluation Patient Details  Name: [name] MRN: [id number] Date of Birth: 16-Jan-1935  Today's Date: 08/23/2021 Time: SLP Start Time (ACUTE ONLY): 0848 SLP Stop Time (ACUTE ONLY): 0906 SLP Time Calculation (min) (ACUTE ONLY): 18 min  Past Medical History:  Past Medical History:  Diagnosis Date   Allergic rhinitis    Anxiety state, unspecified    panic attacks   CHF (congestive heart failure) (HCC)    Depressive disorder, not elsewhere classified    Disorientation, unspecified 08/07/2019   Epistaxis 03/24/2018   Extrinsic asthma, unspecified    no problem since adulthood   History of complications due to general anesthesia 03/03/2021   History of pulmonary embolism 08/07/2019   Hypertensive urgency 01/30/2021   Obesity    OSA on CPAP    severe   Pneumonitis 07/14/2019   Pressure injury of skin 07/10/2019   Pulmonary embolism ([place])    Pure hypercholesterolemia    Respiratory failure with hypoxia ([place]) 09/2008   acute, secondary to multiple bilateral pulmonary embolism , negative hypercoagulable workup 09/2008 hospital stay   Scoliosis    Type II or unspecified type diabetes mellitus without mention of complication, not stated as uncontrolled    Unspecified hypothyroidism    hypo   Past Surgical History:  Past Surgical History:  Procedure Laterality Date   CARDIOVERSION N/A 06/26/2019   Procedure: CARDIOVERSION;  Surgeon: Jerline Pain, MD;  Location: [facility];  Service: Cardiovascular;  Laterality: N/A;   EYE SURGERY Bilateral 2005   cataracts, implants   KIDNEY STONE SURGERY Bilateral    2016, 2017    knee replaced Left 2013   THYROID SURGERY  1966   nodule removal; partial thyroidectomy; radioactive iodine x2; 1990's   HPI:       Assessment / Plan / Recommendation  Clinical Impression  Patient presents with what appears to be normal oropharyngeal swallowing function with appropriate timely mastication of solids and consumption  of thin liquids without overt indication of aspiration. Coughing noted post evaluation not appearing related to po trials. Discussed possiblity of an increase in swallowing difficulty with acute SOB, decreased ability to coordinate breath with swallow. Also discussed importance of oral care post bipap to moisten oral cavity and pharynx prior to pos as well as after pos before going back on bipap to decrease risk of post prandial aspiration. Daughter in room and both verbalized understanding. No f/u SLP services indicated at this time. SLP Visit Diagnosis: Dysphagia, unspecified (R13.10)    Aspiration Risk  Mild aspiration risk    Diet Recommendation Regular;Thin liquid   Liquid Administration via: Cup;Straw Medication Administration: Whole meds with liquid Supervision: Patient able to self feed Compensations: Slow rate;Small sips/bites Postural Changes: Seated upright at 90 degrees    Other  Recommendations Oral Care Recommendations: Oral care before and after PO    Recommendations for follow up therapy are one component of a multi-disciplinary discharge planning process, led by the attending physician.  Recommendations may be updated based on patient status, additional functional criteria and insurance authorization.  Follow up Recommendations No SLP follow up        Swallow Study   General Type of Study: Bedside Swallow Evaluation Previous Swallow Assessment: seen in 2020 with recommendations for thin liquid Diet Prior to this Study: Regular;Thin liquids Temperature Spikes Noted: No Respiratory Status: Nasal cannula (just came off bipap) History of Recent Intubation: No Behavior/Cognition: Alert;Cooperative;Pleasant mood Oral Cavity Assessment: Within Functional Limits Oral Care Completed by SLP: No Oral  Cavity - Dentition: Adequate natural dentition Vision: Functional for self-feeding Self-Feeding Abilities: Able to feed self Patient Positioning: Upright in bed Baseline Vocal  Quality: Normal Volitional Cough: Strong Volitional Swallow: Able to elicit    Oral/Motor/Sensory Function Overall Oral Motor/Sensory Function: Within functional limits   Ice Chips Ice chips: Not tested   Thin Liquid Thin Liquid: Within functional limits Presentation: Straw;Self Fed    Nectar Thick Nectar Thick Liquid: Not tested   Honey Thick Honey Thick Liquid: Not tested   Puree Puree: Not tested   Solid     Solid: Within functional limits Presentation: Self Fed     Ermal Brzozowski MA, CCC-SLP  Keimora Swartout Meryl 08/23/2021,9:16 AM

## 2021-08-23 NOTE — Plan of Care (Signed)
  Problem: Coping: Goal: Level of anxiety will decrease Outcome: Progressing   Problem: Pain Managment: Goal: General experience of comfort will improve Outcome: Progressing   Problem: Safety: Goal: Ability to remain free from injury will improve Outcome: Progressing   

## 2021-08-24 ENCOUNTER — Other Ambulatory Visit: Payer: Self-pay | Admitting: Family Medicine

## 2021-08-24 DIAGNOSIS — J9601 Acute respiratory failure with hypoxia: Secondary | ICD-10-CM

## 2021-08-24 DIAGNOSIS — K811 Chronic cholecystitis: Secondary | ICD-10-CM | POA: Diagnosis present

## 2021-08-24 DIAGNOSIS — Z7189 Other specified counseling: Secondary | ICD-10-CM

## 2021-08-24 DIAGNOSIS — J810 Acute pulmonary edema: Secondary | ICD-10-CM

## 2021-08-24 LAB — BASIC METABOLIC PANEL
Anion gap: 10 (ref 5–15)
BUN: 96 mg/dL — ABNORMAL HIGH (ref 8–23)
CO2: 34 mmol/L — ABNORMAL HIGH (ref 22–32)
Calcium: 8.9 mg/dL (ref 8.9–10.3)
Chloride: 99 mmol/L (ref 98–111)
Creatinine, Ser: 2.71 mg/dL — ABNORMAL HIGH (ref 0.44–1.00)
GFR, Estimated: 17 mL/min — ABNORMAL LOW (ref >60)
Glucose, Bld: 156 mg/dL — ABNORMAL HIGH (ref 70–99)
Potassium: 3.3 mmol/L — ABNORMAL LOW (ref 3.5–5.1)
Sodium: 143 mmol/L (ref 135–145)

## 2021-08-24 LAB — CBC WITH DIFFERENTIAL/PLATELET
Abs Immature Granulocytes: 0.04 10*3/uL (ref 0.00–0.07)
Basophils Absolute: 0 10*3/uL (ref 0.0–0.1)
Basophils Relative: 0 %
Eosinophils Absolute: 0.1 10*3/uL (ref 0.0–0.5)
Eosinophils Relative: 2 %
HCT: 26.2 % — ABNORMAL LOW (ref 36.0–46.0)
Hemoglobin: 8.3 g/dL — ABNORMAL LOW (ref 12.0–15.0)
Immature Granulocytes: 1 %
Lymphocytes Relative: 8 %
Lymphs Abs: 0.5 10*3/uL — ABNORMAL LOW (ref 0.7–4.0)
MCH: 31.3 pg (ref 26.0–34.0)
MCHC: 31.7 g/dL (ref 30.0–36.0)
MCV: 98.9 fL (ref 80.0–100.0)
Monocytes Absolute: 0.6 10*3/uL (ref 0.1–1.0)
Monocytes Relative: 8 %
Neutro Abs: 5.8 10*3/uL (ref 1.7–7.7)
Neutrophils Relative %: 81 %
Platelets: 134 10*3/uL — ABNORMAL LOW (ref 150–400)
RBC: 2.65 MIL/uL — ABNORMAL LOW (ref 3.87–5.11)
RDW: 17 % — ABNORMAL HIGH (ref 11.5–15.5)
WBC: 7 10*3/uL (ref 4.0–10.5)
nRBC: 0 % (ref 0.0–0.2)

## 2021-08-24 LAB — GLUCOSE, CAPILLARY
Glucose-Capillary: 161 mg/dL — ABNORMAL HIGH (ref 70–99)
Glucose-Capillary: 226 mg/dL — ABNORMAL HIGH (ref 70–99)
Glucose-Capillary: 178 mg/dL — ABNORMAL HIGH (ref 70–99)
Glucose-Capillary: 257 mg/dL — ABNORMAL HIGH (ref 70–99)

## 2021-08-24 LAB — BRAIN NATRIURETIC PEPTIDE: B Natriuretic Peptide: 208.2 pg/mL — ABNORMAL HIGH (ref 0.0–100.0)

## 2021-08-24 MED ORDER — FUROSEMIDE 10 MG/ML IJ SOLN
100.0000 mg | Freq: Two times a day (BID) | INTRAVENOUS | Status: DC
  Administered 2021-08-24: 20:00:00 100 mg via INTRAVENOUS
  Filled 2021-08-24 (×3): qty 10

## 2021-08-24 MED ORDER — MORPHINE SULFATE (PF) 2 MG/ML IV SOLN
2.0000 mg | INTRAVENOUS | Status: DC | PRN
  Administered 2021-08-24 – 2021-08-26 (×4): 2 mg via INTRAVENOUS
  Filled 2021-08-24 (×4): qty 1

## 2021-08-24 MED ORDER — LIDOCAINE 4 % EX CREA
TOPICAL_CREAM | Freq: Two times a day (BID) | CUTANEOUS | Status: DC | PRN
  Administered 2021-08-24: 1 via TOPICAL
  Filled 2021-08-24: qty 5

## 2021-08-24 NOTE — NC FL2 (Signed)
[place] MEDICAID FL2 LEVEL OF CARE SCREENING TOOL     IDENTIFICATION  Patient Name: [name] Birthdate: 04-29-1935 Sex: female Admission Date (Current Location): 08/20/2021  [facility] [place] Number:  Herbalist and Address:  The . [facility], [place] [address], [place], [place] [address]      Provider Number: [id number]  Attending Physician Name and Address:  Karie Kirks, DO  Relative Name and Phone Number:       Current Level of Care: Hospital Recommended Level of Care: [place] Prior Approval Number:    Date Approved/Denied:   PASRR Number: [id number]  Discharge Plan: SNF    Current Diagnoses: Patient Active Problem List   Diagnosis Date Noted   Pressure injury of skin 08/22/2021   Elevated troponin 08/21/2021   AKI (acute kidney injury) ([place])    Acute on chronic diastolic (congestive) heart failure ([place]) 03/14/2021   History of anxiety 03/03/2021   History of blood clotting disorder 03/03/2021   History of complications due to general anesthesia 03/03/2021   Acute on chronic diastolic CHF (congestive heart failure) ([place]) 01/30/2021   Lateral epicondylitis of right elbow 08/28/2020   Pain in joint of right elbow 07/15/2020   Calculus of gallbladder without cholecystitis without obstruction 09/28/2019   Infection and inflammatory reaction due to indwelling urethral catheter, subsequent encounter 09/28/2019   Nondisplaced fracture of lateral malleolus of right fibula, subsequent encounter for closed fracture with routine healing 09/28/2019   Hemorrhage of anus and rectum 08/07/2019   Unspecified protein-calorie malnutrition ([place]) 08/07/2019   Difficulty in walking, not elsewhere classified 07/18/2019   Dysphagia, oropharyngeal phase 07/18/2019   Chronic respiratory failure with hypoxia ([place]) 07/03/2019   Atypical atrial flutter (HCC)    Paroxysmal atrial fibrillation ([place]) 05/24/2018   Blister  (nonthermal), unspecified lower leg, initial encounter 03/24/2018   Cellulitis, unspecified 03/24/2018   Diarrhea, unspecified 03/24/2018   Immune thrombocytopenic purpura ([place]) 03/24/2018   Major depressive disorder, recurrent, mild ([place]) 03/24/2018   Nausea with vomiting, unspecified 03/24/2018   Atherosclerotic heart disease of native coronary artery without angina pectoris 01/13/2018   Candidal vulvovaginitis 01/13/2018   Dental caries, unspecified 01/13/2018   Hypertensive heart and chronic kidney disease with heart failure and stage 1 through stage 4 chronic kidney disease, or unspecified chronic kidney disease ([place]) 01/13/2018   Long term (current) use of insulin ([place]) 01/13/2018   Muscle weakness (generalized) 01/13/2018   Other adverse food reactions, not elsewhere classified, initial encounter 01/13/2018   Other chronic sinusitis 01/13/2018   Postprocedural hypothyroidism 01/13/2018   Unsteadiness on feet 01/13/2018   Urinary tract infection, site not specified 01/13/2018   Normocytic anemia 05/18/2017   Chronic ITP (idiopathic thrombocytopenia) ([place]) 01/25/2017   Hypercalciuria 01/15/2016   Hyperoxaluria 01/15/2016   Chronic kidney disease (CKD) stage G4/A1, severely decreased glomerular filtration rate (GFR) between 15-29 mL/min/1.73 square meter and albuminuria creatinine ratio less than 30 mg/g ([place]) 09/04/2015   Acute on chronic respiratory failure with hypoxia and hypercapnia ([place]) 08/24/2015   Decreased urine volume 07/10/2015   Hypocitraturia 07/10/2015   Physical deconditioning 10/11/2014   Latex allergy, contact dermatitis 10/09/2014   Primary osteoarthritis of left knee 09/17/2014   Clostridium difficile carrier 06/07/2014   Constipation, unspecified 06/07/2012   Nutritional deficiency, unspecified 06/07/2012   Tachycardia 06/07/2012   Dyslipidemia 02/12/2012   GERD (gastroesophageal reflux disease) 02/12/2012   DM type 2 (diabetes mellitus, type 2) ([place])  08/05/2010   GOUT 06/23/2010  DERMATITIS 01/16/2010   BACK PAIN 06/13/2009   MYALGIA 06/13/2009   Anxiety state 12/13/2008   LEG EDEMA, CHRONIC 11/08/2008   Sleep apnea 10/26/2008   Hypothyroidism 09/27/2008   HYPERCHOLESTEROLEMIA 09/27/2008   OBESITY 09/27/2008   Depression, recurrent ([place]) 09/27/2008   Essential hypertension 09/27/2008   Allergic rhinitis 09/27/2008   ASTHMA, CHILDHOOD 09/27/2008    Orientation RESPIRATION BLADDER Height & Weight     Self, Time, Situation, Place  O2 (Nasal Cannula 5 liters) Continent (urethral catheter) Weight: 226 lb 3.1 oz (102.6 kg) Height:  5\' 1"  (154.9 cm)  BEHAVIORAL SYMPTOMS/MOOD NEUROLOGICAL BOWEL NUTRITION STATUS      Continent Diet (Please see discharge summary)  AMBULATORY STATUS COMMUNICATION OF NEEDS Skin   Extensive Assist Verbally Other (Comment) (PI Buttocks,L,stage 1,Foam lift dressing to assess site every shift,PI Buttocks R;medial stage 3,dry,pink,intact,wound/incision open or dehiced MASD,abdomen,L,lower)                       Personal Care Assistance Level of Assistance  Bathing, Feeding, Dressing Bathing Assistance: Maximum assistance Feeding assistance: Limited assistance Dressing Assistance: Maximum assistance     Functional Limitations Info  Sight, Hearing, Speech   Hearing Info: Adequate Speech Info: Adequate    SPECIAL CARE FACTORS FREQUENCY  PT (By licensed PT), OT (By licensed OT)     PT Frequency: 5x min weekly OT Frequency: 5x min weekly            Contractures Contractures Info: Not present    Additional Factors Info  Code Status, Allergies, Insulin Sliding Scale, Psychotropic Code Status Info: DNR Allergies Info: Adhesive (tape),Apixaban,Aspirin,Mirabegron,Pineapple,Metformin,Tetracycline,Fluticasone-salmeterol,Iodinated Diagnostic Agents,Lactose Intolerance (gi),Latex,Lisinopril,Metronidazole,Sulfa Antibiotics,Sulfonamide Derivatives Psychotropic Info: sertraline (ZOLOFT) tablet 100  mg daily at bedtime Insulin Sliding Scale Info: insulin aspart (novoLOG) injection 0-15 Units 3 times daily with meals,insulin aspart (novoLOG) injection 0-5 Units daily at bedtime,       Current Medications (08/24/2021):  This is the current hospital active medication list Current Facility-Administered Medications  Medication Dose Route Frequency Provider Last Rate Last Admin   0.9 %  sodium chloride infusion   Intravenous PRN Swayze, Ava, DO 10 mL/hr at 08/22/21 1752 New Bag at 08/22/21 1752   acetaminophen (TYLENOL) tablet 650 mg  650 mg Oral Q6H PRN Vianne Bulls, MD   650 mg at 08/24/21 0867   Or   acetaminophen (TYLENOL) suppository 650 mg  650 mg Rectal Q6H PRN Opyd, Ilene Qua, MD       amLODipine (NORVASC) tablet 5 mg  5 mg Oral Daily Donato Heinz, MD   5 mg at 08/24/21 0818   atorvastatin (LIPITOR) tablet 40 mg  40 mg Oral Daily Opyd, Ilene Qua, MD   40 mg at 08/24/21 0818   cefTRIAXone (ROCEPHIN) 1 g in sodium chloride 0.9 % 100 mL IVPB  1 g Intravenous Q24H Swayze, Ava, DO 200 mL/hr at 08/23/21 1724 1 g at 08/23/21 1724   Chlorhexidine Gluconate Cloth 2 % PADS 6 each  6 each Topical Q0600 Swayze, Ava, DO   6 each at 08/24/21 0641   heparin injection 5,000 Units  5,000 Units Subcutaneous Q8H Opyd, Ilene Qua, MD   5,000 Units at 08/24/21 0640   hydrALAZINE (APRESOLINE) injection 5 mg  5 mg Intravenous Q4H PRN [name], MD       hydrALAZINE (APRESOLINE) tablet 25 mg  25 mg Oral Q8H Chandrasekhar, Mahesh A, MD   25 mg at 08/24/21 6195   hyoscyamine (LEVSIN SL) SL tablet  0.125 mg  0.125 mg Oral Daily Opyd, Ilene Qua, MD   0.125 mg at 08/24/21 0817   insulin aspart (novoLOG) injection 0-15 Units  0-15 Units Subcutaneous TID WC Etta Quill, DO   3 Units at 08/24/21 7001   insulin aspart (novoLOG) injection 0-5 Units  0-5 Units Subcutaneous QHS Jennette Kettle M, DO       levothyroxine (SYNTHROID) tablet 112 mcg  112 mcg Oral QAC breakfast Opyd, Ilene Qua, MD   112  mcg at 08/24/21 7494   liver oil-zinc oxide (DESITIN) 40 % ointment   Topical Daily Swayze, Ava, DO   Given at 08/24/21 0819   ondansetron (ZOFRAN) tablet 4 mg  4 mg Oral Q6H PRN Opyd, Ilene Qua, MD       Or   ondansetron (ZOFRAN) injection 4 mg  4 mg Intravenous Q6H PRN Opyd, Ilene Qua, MD       potassium chloride (KLOR-CON) packet 40 mEq  40 mEq Oral Once Bowser, Laurel Dimmer, NP       senna-docusate (Senokot-S) tablet 1 tablet  1 tablet Oral QHS PRN Opyd, Ilene Qua, MD       sertraline (ZOLOFT) tablet 100 mg  100 mg Oral QHS Opyd, Ilene Qua, MD   100 mg at 08/23/21 2222   sodium chloride flush (NS) 0.9 % injection 3 mL  3 mL Intravenous Q12H Opyd, Ilene Qua, MD   3 mL at 08/23/21 2225     Discharge Medications: Please see discharge summary for a list of discharge medications.  Relevant Imaging Results:  Relevant Lab Results:   Additional Information [id number], Both Covid vaccines  Milas Gain, LCSWA

## 2021-08-24 NOTE — Significant Event (Signed)
Pt with multiple episodes of diarrhea.  Rectal tube ordered at RN request.  Pt does have h/o C.Diff colonization (2015), is on rocephin for UTI.  No WBC, Tm 99.  Cant order C.Diff testing however as this requires ID physician approval.  Per RN: stools loose but dont smell like C.Diff.  So C.Diff does sound a bit less likely overall.  Will defer to day team if they want to get ID consult for C.Diff testing in AM or not.

## 2021-08-24 NOTE — Progress Notes (Signed)
°   08/24/21 1310  Clinical Encounter Type  Visited With Patient and family together  Visit Type Initial;Spiritual support;Social support  Referral From Nurse  Consult/Referral To [place] responded to page for prayer. Pt's daughter, [name], was at bedside. Pt shared how she is tired and wants to be comfortable. Chaplain confirmed there was a palliative consult and validated Pt's wishes. Chaplain discussed [place] with Pt and Pt's daughter. Chaplain prayed with Pt about her wishes, as her spiritual life is important to her. Pt's daughter is supportive of Pt's wishes. Pt requested follow-up in the next couple days. Chaplain remains available.  This note was prepared by Marijo File, MDiv. Chaplain remains available as needed through the on-call pager: [phone].

## 2021-08-24 NOTE — Assessment & Plan Note (Signed)
Renal ultrasound demonstrated cholelithiasis as well as abnormalities of the gallbladder. MRI was suggested by radiology as a means of further invstigating this abnormal appearance. It could be chronic cholecystitis vs malignancy. However, the patient has stated that she is not interested in MRI to look further at this time. Whatever the etiology, the patient appears to be asymptomatic at this time. Earlier complaints of abdominal pain were resolved after a BM.

## 2021-08-24 NOTE — Progress Notes (Signed)
Progress Note  Patient Name: [name] Date of Encounter: 08/24/2021  [facility] Cardiologist: Buford Dresser, MD   Subjective   Stable creatinine from prior.  Had had multiple episodes off of BIPAP.  Still has high I/E.   Inpatient Medications    Scheduled Meds:  amLODipine  5 mg Oral Daily   atorvastatin  40 mg Oral Daily   Chlorhexidine Gluconate Cloth  6 each Topical Q0600   heparin  5,000 Units Subcutaneous Q8H   hydrALAZINE  25 mg Oral Q8H   hyoscyamine  0.125 mg Oral Daily   insulin aspart  0-15 Units Subcutaneous TID WC   insulin aspart  0-5 Units Subcutaneous QHS   levothyroxine  112 mcg Oral QAC breakfast   liver oil-zinc oxide   Topical Daily   potassium chloride  40 mEq Oral Once   sertraline  100 mg Oral QHS   sodium chloride flush  3 mL Intravenous Q12H   Continuous Infusions:  sodium chloride 10 mL/hr at 08/22/21 1752   cefTRIAXone (ROCEPHIN)  IV 1 g (08/23/21 1724)   PRN Meds: sodium chloride, acetaminophen **OR** acetaminophen, hydrALAZINE, ondansetron **OR** ondansetron (ZOFRAN) IV, senna-docusate   Vital Signs    Vitals:   08/24/21 0440 08/24/21 0700 08/24/21 0800 08/24/21 0930  BP: (!) 125/38 (!) 126/44 (!) 146/48   Pulse: (!) 57 (!) 57  60  Resp: 19 18  (!) 24  Temp: 97.7 F (36.5 C) 97.8 F (36.6 C)    TempSrc: Oral Oral    SpO2: 96% 97%  97%  Weight: 102.6 kg     Height:        Intake/Output Summary (Last 24 hours) at 08/24/2021 1051 Last data filed at 08/24/2021 0835 Gross per 24 hour  Intake 360 ml  Output 1275 ml  Net -915 ml   Last 3 Weights 08/24/2021 08/23/2021 08/22/2021  Weight (lbs) 226 lb 3.1 oz 213 lb 3 oz 217 lb 6 oz  Weight (kg) 102.6 kg 96.7 kg 98.6 kg      Telemetry    Sinus bradycardia - Personally Reviewed  ECG    No new ECG - Personally Reviewed  Physical Exam   GEN: No distress on BIPAP 20/8  Neck: JVD to mid neck  Cardiac: bradycardic, regular, holosystolic 2/6 murmur  murmurs, rubs, or gallops.  Respiratory: On BiPAP, diminished at bases GI: Soft, nontender, non-distended  MS: No edema; No deformity. Neuro:  Nonfocal  Psych: Normal affect   Labs    High Sensitivity Troponin:   Recent Labs  Lab 08/20/21 1802 08/20/21 2005 08/21/21 0838 08/21/21 1055  TROPONINIHS 31* 37* 69* 59*     Chemistry Recent Labs  Lab 08/20/21 1142 08/20/21 1142 08/20/21 1802 08/20/21 1911 08/21/21 0348 08/21/21 1500 08/22/21 0148 08/23/21 0222 08/24/21 0439  NA 143  --  141   < > 143   < > 144 145 143  K 4.4  --  4.0   < > 4.2   < > 3.9 3.6 3.3*  CL 99  --  99  --  101  --  102 100 99  CO2 38*  --  31  --  33*  --  32 35* 34*  GLUCOSE 117*  --  209*  --  105*  --  142* 157* 156*  BUN 73*  --  71*  --  74*  --  81* 86* 96*  CREATININE 2.35*  --  2.34*  --  2.73*  --  2.82* 3.09* 2.71*  CALCIUM 9.7  --  9.3  --  8.9  --  9.0 9.0 8.9  MG  --   --   --   --  2.1  --   --   --   --   PROT 7.3  --  7.5  --   --   --   --   --   --   ALBUMIN 3.8  --  3.6  --   --   --   --   --   --   AST 10  --  16  --   --   --   --   --   --   ALT 9  --  16  --   --   --   --   --   --   ALKPHOS 76  --  83  --   --   --   --   --   --   BILITOT 0.7  --  1.0  --   --   --   --   --   --   GFRNONAA  --    < > 20*  --  17*  --  16* 14* 17*  ANIONGAP  --    < > 11  --  9  --  10 10 10    < > = values in this interval not displayed.    Lipids No results for input(s): CHOL, TRIG, HDL, LABVLDL, LDLCALC, CHOLHDL in the last 168 hours.  Hematology Recent Labs  Lab 08/22/21 0148 08/23/21 0222 08/24/21 0439  WBC 9.1 8.5 7.0  RBC 2.80* 2.68* 2.65*  HGB 8.7* 8.5* 8.3*  HCT 27.4* 26.7* 26.2*  MCV 97.9 99.6 98.9  MCH 31.1 31.7 31.3  MCHC 31.8 31.8 31.7  RDW 16.6* 16.9* 17.0*  PLT 128* 133* 134*   Thyroid  Recent Labs  Lab 08/20/21 1142  TSH 0.53    BNP Recent Labs  Lab 08/20/21 1142 08/20/21 2005 08/24/21 0439  BNP  --  237.9* 208.2*  PROBNP 279.0*  --   --      DDimer No results for input(s): DDIMER in the last 168 hours.   Radiology    DG CHEST PORT 1 VIEW  Result Date: 08/23/2021 CLINICAL DATA:  Short of breath. EXAM: PORTABLE CHEST 1 VIEW COMPARISON:  08/22/2021 and older exams. FINDINGS: Right greater than left patchy airspace lung opacities, and more confluent bilateral lung base opacities, are unchanged. No new lung abnormalities. No pneumothorax. IMPRESSION: 1. No significant interval change in the bilateral airspace lung opacities. The more confluent opacities at the lung bases are consistent with a combination of the lung opacities and bilateral pleural effusions. Lung findings may reflect bilateral pneumonia or pulmonary edema, the latter favored. Electronically Signed   By: Lajean Manes M.D.   On: 08/23/2021 09:14     Patient Profile     86 y.o. female with chronic diastolic heart failure, PAF, chronic thrombocytopenia followed by Dr. Marin Olp, HTN, HLD, DM II, chronic respiratory failure on home O2, CKD stage IV, hypothyroidism and OSA not compliant with CPAP who is being seen  for the evaluation of acute on chronic diastolic heart failure  Assessment & Plan    Acute on chronic respiratory failure with hypoxia and hypercapnia. OHS and OSA Acute on chronic diastolic heart failure AKI on CKD stage IV HTN with DM - IV lasix 100 X 2 doses -Strict I's and  O's and daily weights - BP has improved on norvasc and  hydaralazine 25 mg PO TID - has multiple comorbidity- DNR; agree with palliative consult; discussed with family 08/23/21; - no WMA on echo; discussed the echo results at length with patient and her two daugthers  PAF Chronic thrombocytopenia - prior cardiologist discussed with patient, no plans for [facility] in the setting     Hypothyroidism: TSH normal   History of junctional bradycardia:  - now Sinus brady - Currently in sinus bradycardia but intermittently in junctional rhythm - no urgent PPM needs  For questions or updates,  please contact [place] Please consult [email or web address] for contact info under        Signed, Werner Lean, MD  08/24/2021, 10:51 AM

## 2021-08-24 NOTE — TOC Initial Note (Addendum)
Transition of Care [facility]) - Initial/Assessment Note    Patient Details  Name: [name] MRN: [id number] Date of Birth: March 03, 1935  [facility]) CM/SW Contact:    Milas Gain, [place] Phone Number: 08/24/2021, 9:50 AM  Clinical Narrative:                  CSW received consult for possible SNF placement at time of discharge. CSW spoke with patient at bedside regarding PT recommendation of SNF placement at time of discharge. Patient reports she comes from home alone with caregivers from [facility] and [facility].Patient expressed understanding of PT recommendation and is agreeable to SNF placement at time of discharge. Patient gave CSW permission to fax out initial referral near the [place] area. Patients top 2 SNF choices are [facility] and [facility].Patient gave CSW permission to discuss her dc plan with her daughter [name]. CSW discussed patients dc plan with daughter [name] who also is in agreement for patient to go to SNF at dc. Patients daughter reports patient is still waiting on her cpap to be delivered at home. SNF of choice will need to order patient a Cpap.CSW discussed insurance authorization process with patient and patients daughter. Patient reports she has received the COVID vaccines. No further questions reported at this time. CSW to continue to follow and assist with discharge planning needs.   Expected Discharge Plan: Skilled Nursing Facility Barriers to Discharge: Continued Medical Work up   Patient Goals and CMS Choice Patient states their goals for this hospitalization and ongoing recovery are:: SNF CMS Medicare.gov Compare Post Acute Care list provided to:: Patient Choice offered to / list presented to : Patient  Expected Discharge Plan and Services Expected Discharge Plan: [place] In-house Referral: Clinical Social Work Discharge Planning Services: CM Consult   Living arrangements for the past 2 months: [place]                                      Prior Living Arrangements/Services Living arrangements for the past 2 months: Single Family Home Lives with:: Self, Other (Comment) (caregivers from [facility] and [facility]) Patient language and need for interpreter reviewed:: Yes Do you feel safe going back to the place where you live?: No   SNF  Need for Family Participation in Patient Care: Yes (Comment) Care giver support system in place?: Yes (comment) Current home services: DME Criminal Activity/Legal Involvement Pertinent to Current Situation/Hospitalization: No - Comment as needed  Activities of Daily Living      Permission Sought/Granted Permission sought to share information with : Case Manager, Family Supports, Customer service manager Permission granted to share information with : Yes, Verbal Permission Granted  Share Information with NAME: Meredeth  Permission granted to share info w AGENCY: SNF  Permission granted to share info w Relationship: daughter  Permission granted to share info w Contact Information: [name] [phone]  Emotional Assessment Appearance:: Appears stated age Attitude/Demeanor/Rapport: Gracious Affect (typically observed): Calm Orientation: : Oriented to Self, Oriented to Place, Oriented to  Time, Oriented to Situation Alcohol / Substance Use: Not Applicable Psych Involvement: No (comment)  Admission diagnosis:  AKI (acute kidney injury) ([place]) [N17.9] Hypertensive emergency [I16.1] Acute on chronic diastolic CHF (congestive heart failure) ([place]) [I50.33] Acute on chronic respiratory failure with hypoxia and hypercapnia ([place]) [J96.21, J96.22] Acute on chronic congestive heart failure, unspecified heart failure type ([place]) [I50.9] Patient Active  Problem List   Diagnosis Date Noted   Pressure injury of skin 08/22/2021   Elevated troponin 08/21/2021   AKI (acute kidney injury) ([place])    Acute on chronic diastolic  (congestive) heart failure ([place]) 03/14/2021   History of anxiety 03/03/2021   History of blood clotting disorder 03/03/2021   History of complications due to general anesthesia 03/03/2021   Acute on chronic diastolic CHF (congestive heart failure) ([place]) 01/30/2021   Lateral epicondylitis of right elbow 08/28/2020   Pain in joint of right elbow 07/15/2020   Calculus of gallbladder without cholecystitis without obstruction 09/28/2019   Infection and inflammatory reaction due to indwelling urethral catheter, subsequent encounter 09/28/2019   Nondisplaced fracture of lateral malleolus of right fibula, subsequent encounter for closed fracture with routine healing 09/28/2019   Hemorrhage of anus and rectum 08/07/2019   Unspecified protein-calorie malnutrition ([place]) 08/07/2019   Difficulty in walking, not elsewhere classified 07/18/2019   Dysphagia, oropharyngeal phase 07/18/2019   Chronic respiratory failure with hypoxia ([place]) 07/03/2019   Atypical atrial flutter (HCC)    Paroxysmal atrial fibrillation ([place]) 05/24/2018   Blister (nonthermal), unspecified lower leg, initial encounter 03/24/2018   Cellulitis, unspecified 03/24/2018   Diarrhea, unspecified 03/24/2018   Immune thrombocytopenic purpura ([place]) 03/24/2018   Major depressive disorder, recurrent, mild ([place]) 03/24/2018   Nausea with vomiting, unspecified 03/24/2018   Atherosclerotic heart disease of native coronary artery without angina pectoris 01/13/2018   Candidal vulvovaginitis 01/13/2018   Dental caries, unspecified 01/13/2018   Hypertensive heart and chronic kidney disease with heart failure and stage 1 through stage 4 chronic kidney disease, or unspecified chronic kidney disease ([place]) 01/13/2018   Long term (current) use of insulin ([place]) 01/13/2018   Muscle weakness (generalized) 01/13/2018   Other adverse food reactions, not elsewhere classified, initial encounter 01/13/2018   Other chronic sinusitis 01/13/2018   Postprocedural  hypothyroidism 01/13/2018   Unsteadiness on feet 01/13/2018   Urinary tract infection, site not specified 01/13/2018   Normocytic anemia 05/18/2017   Chronic ITP (idiopathic thrombocytopenia) ([place]) 01/25/2017   Hypercalciuria 01/15/2016   Hyperoxaluria 01/15/2016   Chronic kidney disease (CKD) stage G4/A1, severely decreased glomerular filtration rate (GFR) between 15-29 mL/min/1.73 square meter and albuminuria creatinine ratio less than 30 mg/g ([place]) 09/04/2015   Acute on chronic respiratory failure with hypoxia and hypercapnia (HCC) 08/24/2015   Decreased urine volume 07/10/2015   Hypocitraturia 07/10/2015   Physical deconditioning 10/11/2014   Latex allergy, contact dermatitis 10/09/2014   Primary osteoarthritis of left knee 09/17/2014   Clostridium difficile carrier 06/07/2014   Constipation, unspecified 06/07/2012   Nutritional deficiency, unspecified 06/07/2012   Tachycardia 06/07/2012   Dyslipidemia 02/12/2012   GERD (gastroesophageal reflux disease) 02/12/2012   DM type 2 (diabetes mellitus, type 2) ([place]) 08/05/2010   GOUT 06/23/2010   DERMATITIS 01/16/2010   BACK PAIN 06/13/2009   MYALGIA 06/13/2009   Anxiety state 12/13/2008   LEG EDEMA, CHRONIC 11/08/2008   Sleep apnea 10/26/2008   Hypothyroidism 09/27/2008   HYPERCHOLESTEROLEMIA 09/27/2008   OBESITY 09/27/2008   Depression, recurrent ([place]) 09/27/2008   Essential hypertension 09/27/2008   Allergic rhinitis 09/27/2008   ASTHMA, CHILDHOOD 09/27/2008   PCP:  Caren Macadam, MD Pharmacy:   [facility] [place], [address] AT [facility] OF [address] [place] [address] Phone: [phone] Fax: [phone]     Social Determinants of Health (SDOH) Interventions    Readmission Risk Interventions Readmission Risk Prevention Plan 07/20/2019  Transportation Screening Complete  PCP or Specialist Appt within 3-5 Days Not Complete  Not Complete  comments unknown DC date  [place] or [place] Complete  Social Work Consult for [place] Planning/Counseling Not Complete  SW consult not completed comments following for needs  Palliative Care Screening Not Complete  Medication Review (RN Care Manager) Referral to Pharmacy  Some recent data might be hidden

## 2021-08-24 NOTE — Progress Notes (Signed)
NAME:  [name], MRN:  [id number], DOB:  1935/06/10, LOS: 4 ADMISSION DATE:  08/20/2021, CONSULTATION DATE:  08/22/21 REFERRING MD:  Benny Lennert TRH, CHIEF COMPLAINT:  SOB, hypoxia    History of Present Illness:  86 yo F PMH OSA on CPAP, diastolic HF, ITP, GIB, CKD IV, PAF  who presented to ED 12/14 with SOB and somnolence. Hypoxic in ED SpO2 86, hypertensive with SBP > 200 requiring brief nitro gtt. She was admitted to [facility] service for acute on chronic heart failure exacerbation and acute on chronic hypoxic respiratory failure. She was treated with abx BID lasix and  BiPAP. Cardiology was consulted 12/15.   Pulm was consulted 12/16  as she remains on BiPAP. PCCM consulted in this setting   CXR 12/16 with bilateral pleural effusions   Pertinent  Medical History  OSA Diastolic Hf DM HTN ITP CKD IV  PAF GIB PE  Depression   Significant Hospital Events: Including procedures, antibiotic start and stop dates in addition to other pertinent events   12/14 ED for SOB, admit to [facility], started on BID lasix and BiPAP 12/15 cards consult 12/16 still on BiPAP. PCCM consulted.  Interim History / Subjective:  Today she is feeling better. Her breathing is improved. She has not been OOB yet.   Objective   Blood pressure (!) 146/48, pulse 60, temperature 97.8 F (36.6 C), temperature source Oral, resp. rate (!) 24, height 5\' 1"  (1.549 m), weight 102.6 kg, SpO2 97 %.        Intake/Output Summary (Last 24 hours) at 08/24/2021 1133 Last data filed at 08/24/2021 0835 Gross per 24 hour  Intake 360 ml  Output 1275 ml  Net -915 ml    Filed Weights   08/22/21 0432 08/23/21 0102 08/24/21 0440  Weight: 98.6 kg 96.7 kg 102.6 kg    Examination: General: chronically ill appearing woman sitting up in bed in NAD HENT: [place]/AT eyes anicteric Lungs: breathing comfortably on bipap, no accessory muscle use Cardiovascular: braydcardic, reg rhythm Abdomen: nondistended Extremities: pitting  edema BLE, no cyanosis Neuro: much more alert, awake, moving all extremities spontaneously Derm: warm, dry  BNP 238> 208 BUN 96 Cr 2.71 H/H 8.3/26.2 Platelets 134  I/O -1.8L, net -3.7L for admission  Echo: LVEF 60-65%, mod LVH, normal RV function, RV mildly enlarged. Dilated IVC, normal variability.  Resolved Hospital Problem list     Assessment & Plan:   Acute on chronic respiratory failure with hypoxia OSA not on CPAP at home Acute on chronic HFpEF Bilateral pleural effusions Pulmonary edema. No suspicion for pneumonitis, that she had back in 2020. -Con't aggressive diuresis.  -strict I/Os -Repeat CXR tomorrow, can recheck BNP again in a few more days -I stressed the importance of pulmonary hygiene and OOB mobility. -weaning supplemental O2 as required to maintain SpO2>90% -con't bipap when sleeping  Depression -Con't zoloft  Goals of Care DNR status - pt is DNR status which is appropriate -Patient indicated that she is not interested in some aggressive interventions. She is not interested in getting an MRI to further evaluate her GB. She feels her quality of life has not been great due to her medical issues. She wants to eat a rather restricted diet (orange sherbert). We discussed that Palliative Care can help [place] focus more on improving quality of life and establishing boundaries of what she is interested in pursuing. Her daughters [name] at bedside, [name] via phone) both supported her to make these decisions. I updated Dr. Benny Lennert on this  discussion. Will discuss with palliative tomorrow when NP Pickenpack-Cousins is back. Chaplain called at patient's request.  Best Practice (right click and "Reselect all SmartList Selections" daily)    Code Status:  DNR Last date of multidisciplinary goals of care discussion [family updated by ccm 12/18]  Labs   CBC: Recent Labs  Lab 08/20/21 1142 08/20/21 1802 08/20/21 1911 08/21/21 0348 08/21/21 1500 08/22/21 0148  08/23/21 0222 08/24/21 0439  WBC 6.1 10.2  --  6.4  --  9.1 8.5 7.0  NEUTROABS 4.9 8.6*  --   --   --   --   --  5.8  HGB 10.0* 10.7*   < > 9.2* 9.5* 8.7* 8.5* 8.3*  HCT 29.8* 35.6*   < > 29.6* 28.0* 27.4* 26.7* 26.2*  MCV 93.2 102.9*  --  99.7  --  97.9 99.6 98.9  PLT 143.0* 163  --  128*  --  128* 133* 134*   < > = values in this interval not displayed.     Basic Metabolic Panel: Recent Labs  Lab 08/20/21 1802 08/20/21 1911 08/21/21 0348 08/21/21 1500 08/22/21 0148 08/23/21 0222 08/24/21 0439  NA 141   < > 143 145 144 145 143  K 4.0   < > 4.2 4.1 3.9 3.6 3.3*  CL 99  --  101  --  102 100 99  CO2 31  --  33*  --  32 35* 34*  GLUCOSE 209*  --  105*  --  142* 157* 156*  BUN 71*  --  74*  --  81* 86* 96*  CREATININE 2.34*  --  2.73*  --  2.82* 3.09* 2.71*  CALCIUM 9.3  --  8.9  --  9.0 9.0 8.9  MG  --   --  2.1  --   --   --   --    < > = values in this interval not displayed.    GFR: Estimated Creatinine Clearance: 17 mL/min (A) (by C-G formula based on SCr of 2.71 mg/dL (H)). Recent Labs  Lab 08/20/21 1829 08/20/21 2005 08/21/21 0348 08/22/21 0148 08/23/21 0222 08/24/21 0439  WBC  --   --  6.4 9.1 8.5 7.0  LATICACIDVEN 0.8 0.9  --   --   --   --      Liver Function Tests: Recent Labs  Lab 08/20/21 1142 08/20/21 1802  AST 10 16  ALT 9 16  ALKPHOS 76 83  BILITOT 0.7 1.0  PROT 7.3 7.5  ALBUMIN 3.8 3.6    No results for input(s): LIPASE, AMYLASE in the last 168 hours. No results for input(s): AMMONIA in the last 168 hours.   Julian Hy, DO 08/24/21 12:58 PM [facility]

## 2021-08-24 NOTE — Progress Notes (Signed)
PROGRESS NOTE  Shacara Cozine EGB:[id number] DOB: Jan 07, 1935 DOA: 08/20/2021 PCP: Caren Macadam, MD  Brief History   Nikaela Coyne is a pleasant 86 y.o. female with medical history significant for OSA on CPAP, chronic diastolic CHF, PAF not anticoagulated due to history of GI bleed, ITP, IDA, CKD stage IV, IDDM, depression, and anxiety, now presenting to the emergency department with worsening shortness of breath and somnolence.  The patient is accompanied by her daughter who assists with the history.  Patient lives alone but has daughters who check on her frequently and have noted that the patient has become progressively short of breath over the past week or more and has been progressively fatigued and now somnolent.  She had increased leg swelling and reported a sensation of fullness around her abdomen, had her diuretic increased 2 days ago and lost 7 pounds since then but continued to have increased shortness of breath and somnolence.  She uses a walker at baseline.  For the past month has wanted family to put her in a wheelchair, had not been taking her diuretic consistently due to not wanting to have to go to the restroom, and had been urinating on herself.  The patient had been seen by pulmonology, PCP, and oncology this week prior to presentation in the ED.    ED Course: Upon arrival to the ED, patient is found to be afebrile, saturating 86% on 6 L/min of supplemental oxygen, and with initial SBP of 215.  EKG features accelerated junctional rhythm.  Chest x-ray with worsening diffuse bilateral airspace disease and small bilateral pleural effusions.  Chemistry panel notable for glucose 209 and creatinine 2.34.  CBC with hemoglobin 10.7 and MCV 102.9.  ABG with pH 2.67 and pCO2 80.2.  Patient was placed on BiPAP and treated with IV Lasix, Rocephin, and briefly with nitroglycerin infusion in the ED.  The patient was found to have a UTI. She was started on Rocephin.   [facility] were consulted to admit the patient for further evaluation and treatment.  The patient is admitted as inpatient to a progressive bed on BIPAP.  Her troponin upon arrival was 31. The next troponin was 37. When it was rechecked this am it was 58. EKG demonstrated a junctional rhythm. Cardiology was consulted.   The patient remains on BIPAP. She is saturating at 95% on 40% bleed in with BIPAP. ABG has been ordered to follow her respiratory acidosis.  She is a DNR  Pulmonology has been consulted. They have recommended continued efforts to diurese the patient and to wean off of BIPAP.  Palliative care has been consulted. The patient has declined MRI to investigate abnormalities of gallbladder. She would have difficulty breathing lying flat for the study.    Consultants  Cardiology Pulmonology  Procedures  None  Antibiotics   Anti-infectives (From admission, onward)    Start     Dose/Rate Route Frequency Ordered Stop   08/21/21 1800  cefTRIAXone (ROCEPHIN) 1 g in sodium chloride 0.9 % 100 mL IVPB        1 g 200 mL/hr over 30 Minutes Intravenous Every 24 hours 08/21/21 0013 08/25/21 1759   08/20/21 2030  cefTRIAXone (ROCEPHIN) 1 g in sodium chloride 0.9 % 100 mL IVPB        1 g 200 mL/hr over 30 Minutes Intravenous  Once 08/20/21 2019 08/20/21 2121       Subjective  The patient is resting comfortably on BIPAP. She is sleeping. Her daughter  is at bedside.  Objective   Vitals:  Vitals:   08/24/21 0930 08/24/21 1206  BP:  (!) 128/53  Pulse: 60 (!) 57  Resp: (!) 24 17  Temp:  (!) 96.7 F (35.9 C)  SpO2: 97% 97%    Exam:  Constitutional:  The patient is sleeping. She is not awakened. She is on BIPAP. No acute distress. Respiratory:  Pt is on BIPAP. Positive for scattered rales throughout.  No wheezes or rhonchi are appreciated. No tactile fremitus Cardiovascular:  Regular rate and rhythm No murmurs, ectopy, or gallups. No lateral PMI. No  thrills. Abdomen:  Abdomen is soft, non-tender, non-distended No hernias, masses, or organomegaly Normoactive bowel sounds.  Musculoskeletal:  No cyanosis or clubbing 2+ pitting edema to lower extremities bilaterally Skin:  No rashes, lesions, ulcers palpation of skin: no induration or nodules Neurologic:  CN 2-12 intact Patient is moving all extremities. Speech fluent Psychiatric:  Mental status Mood, affect appropriate Orientation to person, place, time  judgment and insight appear intact I have personally reviewed the following:   Today's Data  Vitals  Lab Data  CBC BMP  Micro Data  Urine culture  Imaging  CXR Renal ultrasound  Cardiology Data  Troponin EKG  Scheduled Meds:  amLODipine  5 mg Oral Daily   atorvastatin  40 mg Oral Daily   Chlorhexidine Gluconate Cloth  6 each Topical Q0600   heparin  5,000 Units Subcutaneous Q8H   hydrALAZINE  25 mg Oral Q8H   hyoscyamine  0.125 mg Oral Daily   insulin aspart  0-15 Units Subcutaneous TID WC   insulin aspart  0-5 Units Subcutaneous QHS   levothyroxine  112 mcg Oral QAC breakfast   liver oil-zinc oxide   Topical Daily   potassium chloride  40 mEq Oral Once   sertraline  100 mg Oral QHS   sodium chloride flush  3 mL Intravenous Q12H   Continuous Infusions:  sodium chloride Stopped (08/22/21 1926)   cefTRIAXone (ROCEPHIN)  IV Stopped (08/23/21 1755)   furosemide      Principal Problem:   Acute on chronic respiratory failure with hypoxia and hypercapnia (HCC) Active Problems:   DM type 2 (diabetes mellitus, type 2) (HCC)   Depression, recurrent (HCC)   Sleep apnea   Chronic kidney disease (CKD) stage G4/A1, severely decreased glomerular filtration rate (GFR) between 15-29 mL/min/1.73 square meter and albuminuria creatinine ratio less than 30 mg/g (HCC)   Chronic ITP (idiopathic thrombocytopenia) (HCC)   Acute on chronic diastolic CHF (congestive heart failure) (HCC)   Physical deconditioning    Urinary tract infection, site not specified   Elevated troponin   Pressure injury of skin   Chronic cholecystitis    LOS: 4 days    A & P  Chronic ITP (idiopathic thrombocytopenia) (HCC) Platelets are stable at 134. They have ranged from 107 to 140 in the past 60 days. NPlate was not given on her most recent visit to Oncology, although she did receive an iron infusion or on admission. Monitor.  Chronic kidney disease (CKD) stage G4/A1, severely decreased glomerular filtration rate (GFR) between 15-29 mL/min/1.73 square meter and albuminuria creatinine ratio less than 30 mg/g (HCC) Pt has AKI on CKD G4A1 with severely decreased filtration rate (15-29 mL/min/1.73 msquared) with albumin/creatinine ration less than 30 mg/g. Creatinine is increased from baseline of 1.80 to 3.09. Will monitor creatinine, electrolytes and volume status. Avoid nephrotoxins and hypotension. Will check renal ultrasound. Creatinine is 2.71 today.  Depression, recurrent ([place]) Continue  Zoloft as at home.  DM type 2 (diabetes mellitus, type 2) ([place]) At home the patient's glucoses have been controlled with Lantus 46 Units qhs, Trulicity 1.65 mg Qwk, Lispro prn meals. Will follow glucoses with FSBS and SSI for now. Glucose upon admission was 105. Will also check HbA1c. Glucoses have run from 142 to 156.  Physical deconditioning Noted. The patient has been evaluated by PT/OT. Their recommendation is for discharge to SNF for rehab.  Sleep apnea Pt uses CPAP at home, but states that she likes BIPAP better. Perhaps can get BIPAP for home use.  Urinary tract infection, site not specified Urine has grown out klebsiella pneumoniae that is sensitive to Rocephin.  Acute on chronic diastolic CHF (congestive heart failure) (HCC) Echocardiogram from 01/2021 demonstrated an EF of 60-65%. Diastolic function could not be evaluated on that study. Echo from 06/2019 demonstrated an EF of 50-55% with Grade 1 diastolic function. Will  order another echocardiogram. Cardiology has been consulted. Pulmonology has been consulted.Continue to diurese as possible. Lasix has been increased to 80 mg IV Q 8.   Acute on chronic respiratory failure with hypoxia and hypercapnia (HCC) Pt presented in ED with hypoxic and hypercapneic respiratory failure. She was placed on BIPAP. ABG repeated this afternoon demonstrated resolution of the respiratory acidosis. BIPAP has been removed. I will recheck ABG in 3-4 hours to evaluate her ventilation. Pulmonology has been consulted. They have recommended continued diuresis and attempts to wean. Pt continues to be dependent upon BIPAP.  Elevated troponin Initial troponin 31, then 37, then 69. EKG demonstrates a junctional rhythm. Cardiology has been consulted. I appreciate their help.  Chronic cholecystitis Renal ultrasound demonstrated cholelithiasis as well as abnormalities of the gallbladder. MRI was suggested by radiology as a means of further invstigating this abnormal appearance. It could be chronic cholecystitis vs malignancy. However, the patient has stated that she is not interested in MRI to look further at this time. Whatever the etiology, the patient appears to be asymptomatic at this time. Earlier complaints of abdominal pain were resolved after a BM.  I have seen and examined this patient myself. I have spent 30 minutes in her evaluation and care.  DVT prophylaxis: sq heparin  Code Status: DNR, confirmed with daughter on admission  Level of Care: Level of care: Progressive Family Communication: Daughter at bedside  Disposition Plan:  Patient is from: home  Anticipated d/c is to: SNF Anticipated d/c date is: 08/24/21 Patient currently: Pending improvement in respiratory status  Consults called: Cardiology Admission status: Inpatient    Lezli Danek, DO [facility] Direct contact: see [email or web address]  7PM-7AM contact night coverage as above 08/24/2021, 2:27 PM  LOS: 1 day

## 2021-08-25 ENCOUNTER — Inpatient Hospital Stay (HOSPITAL_COMMUNITY): Payer: Medicare Other

## 2021-08-25 DIAGNOSIS — J90 Pleural effusion, not elsewhere classified: Secondary | ICD-10-CM

## 2021-08-25 DIAGNOSIS — I509 Heart failure, unspecified: Secondary | ICD-10-CM

## 2021-08-25 DIAGNOSIS — R0902 Hypoxemia: Secondary | ICD-10-CM

## 2021-08-25 DIAGNOSIS — I161 Hypertensive emergency: Secondary | ICD-10-CM

## 2021-08-25 DIAGNOSIS — Z515 Encounter for palliative care: Secondary | ICD-10-CM

## 2021-08-25 DIAGNOSIS — N179 Acute kidney failure, unspecified: Secondary | ICD-10-CM

## 2021-08-25 LAB — GLUCOSE, CAPILLARY
Glucose-Capillary: 148 mg/dL — ABNORMAL HIGH (ref 70–99)
Glucose-Capillary: 160 mg/dL — ABNORMAL HIGH (ref 70–99)
Glucose-Capillary: 201 mg/dL — ABNORMAL HIGH (ref 70–99)
Glucose-Capillary: 235 mg/dL — ABNORMAL HIGH (ref 70–99)
Glucose-Capillary: 247 mg/dL — ABNORMAL HIGH (ref 70–99)

## 2021-08-25 LAB — CBC WITH DIFFERENTIAL/PLATELET
Abs Immature Granulocytes: 0.03 10*3/uL (ref 0.00–0.07)
Basophils Absolute: 0 10*3/uL (ref 0.0–0.1)
Basophils Relative: 0 %
Eosinophils Absolute: 0.1 10*3/uL (ref 0.0–0.5)
Eosinophils Relative: 2 %
HCT: 25.3 % — ABNORMAL LOW (ref 36.0–46.0)
Hemoglobin: 7.8 g/dL — ABNORMAL LOW (ref 12.0–15.0)
Immature Granulocytes: 0 %
Lymphocytes Relative: 7 %
Lymphs Abs: 0.5 10*3/uL — ABNORMAL LOW (ref 0.7–4.0)
MCH: 30.2 pg (ref 26.0–34.0)
MCHC: 30.8 g/dL (ref 30.0–36.0)
MCV: 98.1 fL (ref 80.0–100.0)
Monocytes Absolute: 0.6 10*3/uL (ref 0.1–1.0)
Monocytes Relative: 8 %
Neutro Abs: 6.2 10*3/uL (ref 1.7–7.7)
Neutrophils Relative %: 83 %
Platelets: 134 10*3/uL — ABNORMAL LOW (ref 150–400)
RBC: 2.58 MIL/uL — ABNORMAL LOW (ref 3.87–5.11)
RDW: 16.7 % — ABNORMAL HIGH (ref 11.5–15.5)
WBC: 7.6 10*3/uL (ref 4.0–10.5)
nRBC: 0 % (ref 0.0–0.2)

## 2021-08-25 LAB — BASIC METABOLIC PANEL
Anion gap: 10 (ref 5–15)
BUN: 103 mg/dL — ABNORMAL HIGH (ref 8–23)
CO2: 33 mmol/L — ABNORMAL HIGH (ref 22–32)
Calcium: 8.8 mg/dL — ABNORMAL LOW (ref 8.9–10.3)
Chloride: 97 mmol/L — ABNORMAL LOW (ref 98–111)
Creatinine, Ser: 2.68 mg/dL — ABNORMAL HIGH (ref 0.44–1.00)
GFR, Estimated: 17 mL/min — ABNORMAL LOW (ref >60)
Glucose, Bld: 155 mg/dL — ABNORMAL HIGH (ref 70–99)
Potassium: 3.1 mmol/L — ABNORMAL LOW (ref 3.5–5.1)
Sodium: 140 mmol/L (ref 135–145)

## 2021-08-25 MED ORDER — FUROSEMIDE 10 MG/ML IJ SOLN
80.0000 mg | Freq: Two times a day (BID) | INTRAMUSCULAR | Status: DC
  Administered 2021-08-25 – 2021-08-27 (×5): 80 mg via INTRAVENOUS
  Filled 2021-08-25 (×5): qty 8

## 2021-08-25 MED ORDER — POTASSIUM CHLORIDE CRYS ER 20 MEQ PO TBCR
40.0000 meq | EXTENDED_RELEASE_TABLET | ORAL | Status: AC
  Administered 2021-08-25: 12:00:00 40 meq via ORAL
  Filled 2021-08-25: qty 2

## 2021-08-25 MED ORDER — POTASSIUM CHLORIDE CRYS ER 20 MEQ PO TBCR
40.0000 meq | EXTENDED_RELEASE_TABLET | Freq: Once | ORAL | Status: AC
  Administered 2021-08-25: 06:00:00 40 meq via ORAL
  Filled 2021-08-25: qty 2

## 2021-08-25 MED ORDER — PHENOL 1.4 % MT LIQD
1.0000 | OROMUCOSAL | Status: DC | PRN
  Administered 2021-08-25 – 2021-08-26 (×2): 1 via OROMUCOSAL
  Filled 2021-08-25: qty 177

## 2021-08-25 MED ORDER — FUROSEMIDE 10 MG/ML IJ SOLN: 80.0000 mg | Freq: Two times a day (BID) | INTRAMUSCULAR | Status: DC

## 2021-08-25 NOTE — Care Management Important Message (Signed)
Important Message  Patient Details  Name: [name] MRN: [id number] Date of Birth: 09/07/1935   Medicare Important Message Given:  Yes     Shelda Altes 08/25/2021, 9:47 AM

## 2021-08-25 NOTE — Progress Notes (Signed)
PROGRESS NOTE  [name] ZOX:[id number] DOB: Oct 15, 1934 DOA: 08/20/2021 PCP: Caren Macadam, MD  Brief History   [name] is a pleasant 86 y.o. female with medical history significant for OSA on CPAP, chronic diastolic CHF, PAF not anticoagulated due to history of GI bleed, ITP, IDA, CKD stage IV, IDDM, depression, and anxiety, now presenting to the emergency department with worsening shortness of breath and somnolence.  The patient is accompanied by her daughter who assists with the history.  Patient lives alone but has daughters who check on her frequently and have noted that the patient has become progressively short of breath over the past week or more and has been progressively fatigued and now somnolent.  She had increased leg swelling and reported a sensation of fullness around her abdomen, had her diuretic increased 2 days ago and lost 7 pounds since then but continued to have increased shortness of breath and somnolence.  She uses a walker at baseline.  For the past month has wanted family to put her in a wheelchair, had not been taking her diuretic consistently due to not wanting to have to go to the restroom, and had been urinating on herself.  The patient had been seen by pulmonology, PCP, and oncology this week prior to presentation in the ED.    ED Course: Upon arrival to the ED, patient is found to be afebrile, saturating 86% on 6 L/min of supplemental oxygen, and with initial SBP of 215.  EKG features accelerated junctional rhythm.  Chest x-ray with worsening diffuse bilateral airspace disease and small bilateral pleural effusions.  Chemistry panel notable for glucose 209 and creatinine 2.34.  CBC with hemoglobin 10.7 and MCV 102.9.  ABG with pH 2.67 and pCO2 80.2.  Patient was placed on BiPAP and treated with IV Lasix, Rocephin, and briefly with nitroglycerin infusion in the ED.  The patient was found to have a UTI. She was started on Rocephin.   [facility] were consulted to admit the patient for further evaluation and treatment.  The patient is admitted as inpatient to a progressive bed on BIPAP.  Her troponin upon arrival was 31. The next troponin was 37. When it was rechecked this am it was 33. EKG demonstrated a junctional rhythm. Cardiology was consulted.   The patient remains on BIPAP. She is saturating at 95% on 40% bleed in with BIPAP. ABG has been ordered to follow her respiratory acidosis.  She is a DNR  Pulmonology has been consulted. They have recommended continued efforts to diurese the patient and to wean off of BIPAP.  Palliative care has been consulted. The patient has declined MRI to investigate abnormalities of gallbladder. She would have difficulty breathing lying flat for the study.   Palliative care has been consulted.   Consultants  Cardiology Pulmonology Palliative care  Procedures  None  Antibiotics   Anti-infectives (From admission, onward)    Start     Dose/Rate Route Frequency Ordered Stop   08/21/21 1800  cefTRIAXone (ROCEPHIN) 1 g in sodium chloride 0.9 % 100 mL IVPB        1 g 200 mL/hr over 30 Minutes Intravenous Every 24 hours 08/21/21 0013 08/24/21 1844   08/20/21 2030  cefTRIAXone (ROCEPHIN) 1 g in sodium chloride 0.9 % 100 mL IVPB        1 g 200 mL/hr over 30 Minutes Intravenous  Once 08/20/21 2019 08/20/21 2121       Subjective  The patient is awake  and alert. No new complaints. Her daughter is at bedside.  Objective   Vitals:  Vitals:   08/25/21 1030 08/25/21 1119  BP:  (!) 130/41  Pulse: (!) 55 (!) 56  Resp: 18 18  Temp:    SpO2: 94% 98%    Exam:  Constitutional:  The patient is sleeping. She is not awakened. She is on BIPAP. No acute distress. Respiratory:  Pt is on BIPAP. No rales are auscultated.  No wheezes or rhonchi are appreciated. No tactile fremitus Cardiovascular:  Regular rate and rhythm No murmurs, ectopy, or gallups. No lateral PMI. No  thrills. Abdomen:  Abdomen is soft, non-tender, non-distended No hernias, masses, or organomegaly Normoactive bowel sounds.  Musculoskeletal:  No cyanosis or clubbing 2+ pitting edema to lower extremities bilaterally Skin:  No rashes, lesions, ulcers palpation of skin: no induration or nodules Neurologic:  CN 2-12 intact Patient is moving all extremities. Speech fluent Psychiatric:  Mental status Mood, affect appropriate Orientation to person, place, time  judgment and insight appear intact I have personally reviewed the following:   Today's Data  Vitals  Lab Data  CBC CMP  Micro Data  Urine culture  Imaging  CXR Renal ultrasound  Cardiology Data  Troponin EKG  Scheduled Meds:  amLODipine  5 mg Oral Daily   atorvastatin  40 mg Oral Daily   Chlorhexidine Gluconate Cloth  6 each Topical Q0600   furosemide  80 mg Intravenous BID   heparin  5,000 Units Subcutaneous Q8H   hydrALAZINE  25 mg Oral Q8H   hyoscyamine  0.125 mg Oral Daily   insulin aspart  0-15 Units Subcutaneous TID WC   insulin aspart  0-5 Units Subcutaneous QHS   levothyroxine  112 mcg Oral QAC breakfast   liver oil-zinc oxide   Topical Daily   sertraline  100 mg Oral QHS   sodium chloride flush  3 mL Intravenous Q12H   Continuous Infusions:  sodium chloride Stopped (08/22/21 1926)    Principal Problem:   Acute on chronic respiratory failure with hypoxia and hypercapnia (HCC) Active Problems:   DM type 2 (diabetes mellitus, type 2) (HCC)   Depression, recurrent (HCC)   Sleep apnea   Chronic kidney disease (CKD) stage G4/A1, severely decreased glomerular filtration rate (GFR) between 15-29 mL/min/1.73 square meter and albuminuria creatinine ratio less than 30 mg/g (HCC)   Chronic ITP (idiopathic thrombocytopenia) (HCC)   Acute on chronic diastolic CHF (congestive heart failure) (HCC)   Diarrhea, unspecified   Physical deconditioning   Urinary tract infection, site not specified    Elevated troponin   Pressure injury of skin   Chronic cholecystitis   Acute on chronic congestive heart failure (HCC)    LOS: 5 days    A & P  Chronic ITP (idiopathic thrombocytopenia) (HCC) Platelets are stable at 134. They have ranged from 107 to 140 in the past 60 days. NPlate was not given on her most recent visit to Oncology, although she did receive an iron infusion or on admission. Monitor.  Chronic kidney disease (CKD) stage G4/A1, severely decreased glomerular filtration rate (GFR) between 15-29 mL/min/1.73 square meter and albuminuria creatinine ratio less than 30 mg/g (HCC) Pt has AKI on CKD G4A1 with severely decreased filtration rate (15-29 mL/min/1.73 msquared) with albumin/creatinine ration less than 30 mg/g. Creatinine is increased from baseline of 1.80 to 3.09. Will monitor creatinine, electrolytes and volume status. Avoid nephrotoxins and hypotension. Will check renal ultrasound. Creatinine is 2.68 today.  Depression, recurrent ([place]) Continue  Zoloft as at home.  DM type 2 (diabetes mellitus, type 2) ([place]) At home the patient's glucoses have been controlled with Lantus 46 Units qhs, Trulicity 6.72 mg Qwk, Lispro prn meals. Will follow glucoses with FSBS and SSI for now. Glucose upon admission was 105. Will also check HbA1c. Glucoses have run from 148 to 257.  Physical deconditioning Noted. The patient has been evaluated by PT/OT. Their recommendation is for discharge to SNF for rehab.  Sleep apnea Pt uses CPAP at home, but states that she likes BIPAP better. Perhaps can get BIPAP for home use.  Urinary tract infection, site not specified Urine has grown out klebsiella pneumoniae that is sensitive to Rocephin. The patient has completed her course of antibiotics.  Acute on chronic diastolic CHF (congestive heart failure) (HCC) Echocardiogram from 01/2021 demonstrated an EF of 60-65%. Diastolic function could not be evaluated on that study. Echo from 06/2019 demonstrated  an EF of 50-55% with Grade 1 diastolic function. Will order another echocardiogram. Cardiology has been consulted. Pulmonology has been consulted.Continue to diurese as possible. Lasix has been increased to 80 mg IV Q 12 hours. Clinically the patient nearing a dry weight.  Acute on chronic respiratory failure with hypoxia and hypercapnia (HCC) Pt presented in ED with hypoxic and hypercapneic respiratory failure. She was placed on BIPAP. ABG repeated this afternoon demonstrated resolution of the respiratory acidosis. BIPAP has been removed. I will recheck ABG in 3-4 hours to evaluate her ventilation. Pulmonology has been consulted. They have recommended continued diuresis and attempts to wean. Pt continues to be dependent upon BIPAP. Weaning O2 as possible.   Elevated troponin Initial troponin 31, then 37, then 69. EKG demonstrates a junctional rhythm. Cardiology has been consulted. I appreciate their help.  Chronic cholecystitis Renal ultrasound demonstrated cholelithiasis as well as abnormalities of the gallbladder. MRI was suggested by radiology as a means of further invstigating this abnormal appearance. It could be chronic cholecystitis vs malignancy. However, the patient has stated that she is not interested in MRI to look further at this time. Whatever the etiology, the patient appears to be asymptomatic at this time. Earlier complaints of abdominal pain were resolved after a BM.  I have seen and examined this patient myself. I have spent 30 minutes in her evaluation and care.  DVT prophylaxis: sq heparin  Code Status: DNR, confirmed with daughter on admission  Level of Care: Level of care: Progressive Family Communication: Daughter at bedside  Disposition Plan:  Patient is from: home  Anticipated d/c is to: SNF Anticipated d/c date is: 08/24/21 Patient currently: Pending improvement in respiratory status  Consults called: Cardiology Admission status: Inpatient    Isolde Skaff, DO [facility] Direct contact: see [email or web address]  7PM-7AM contact night coverage as above 08/25/2021, 2:59 PM  LOS: 1 day

## 2021-08-25 NOTE — Progress Notes (Signed)
[place] [id number]) Hospital Liaison RN note  Received request from [facility] for hospice services at home after discharge. Chart and patient information under review by Hospice physician. Hospice eligibility pending at this time.   Spoke with daughter's [name] and [name] to initiate education related to hospice philosophy, services, and team approach to care. Patient/family verbalized understanding of information given. Per discussion, the plan is for discharge home by EMS likely Wednesday.  DME needs discussed. Patient has the following equipment in the home. Lift chair and older wheelchair, as well as oxygen concentrator. Patient/family requests the following equipment for deliver: Oxygen, hospital bed, overbed table, wheelchair and bipap. Address has been verified and is correct in the chart. [name] at [phone] is the family contact to arrange time of equipment delivery.   Patient will need MD order for bipap including settings prior to delivery of DME.   Please send signed and completed DNR with patient/family. Please provide symptoms at discharge as needed for ongoing symptom management.   [facility] information and contact numbers given to [facility]. Above information shared with [place]. Please call with any hospice related questions or concerns.   Thank you for the opportunity to participate in this patient's care.  Jhonnie Garner, Therapist, sports, BSN [employer] [phone]

## 2021-08-25 NOTE — Progress Notes (Signed)
NAME:  [name], MRN:  [id number], DOB:  [birth year], LOS: 5 ADMISSION DATE:  08/20/2021, CONSULTATION DATE:  08/22/21 REFERRING MD:  Benny Lennert TRH, CHIEF COMPLAINT:  SOB, hypoxia    History of Present Illness:  86 yo F PMH OSA on CPAP, diastolic HF, ITP, GIB, CKD IV, PAF  who presented to ED 12/14 with SOB and somnolence. Hypoxic in ED SpO2 86, hypertensive with SBP > 200 requiring brief nitro gtt. She was admitted to [facility] service for acute on chronic heart failure exacerbation and acute on chronic hypoxic respiratory failure. She was treated with abx BID lasix and  BiPAP. Cardiology was consulted 12/15.   Pulm was consulted 12/16  as she remains on BiPAP. PCCM consulted in this setting   CXR 12/16 with bilateral pleural effusions   Pertinent  Medical History  OSA Diastolic Hf DM HTN ITP CKD IV  PAF GIB PE  Depression   Significant Hospital Events: Including procedures, antibiotic start and stop dates in addition to other pertinent events   12/14 ED for SOB, admit to [facility], started on BID lasix and BiPAP 12/15 cards consult 12/16 still on BiPAP. PCCM consulted 12/16 echo Echo: LVEF 60-65%, mod LVH, normal RV function, RV mildly enlarged. Dilated IVC, normal variability 12/19 still somewhat short of breath, palliative medicine to see her  Interim History / Subjective:   Feels a lot better Likes the BIPAP mask Diuresing well Wants to talk to palliative medicine   Objective   Blood pressure (!) 130/41, pulse (!) 56, temperature 98.1 F (36.7 C), temperature source Oral, resp. rate 18, height 5\' 1"  (1.549 m), weight 95.7 kg, SpO2 98 %.        Intake/Output Summary (Last 24 hours) at 08/25/2021 1127 Last data filed at 08/25/2021 0935 Gross per 24 hour  Intake 678.13 ml  Output 500 ml  Net 178.13 ml   Filed Weights   08/23/21 0102 08/24/21 0440 08/25/21 0452  Weight: 96.7 kg 102.6 kg 95.7 kg    Examination:  General:  Chronically ill appearing, resting  comfortably in bed HENT: NCAT OP clear PULM: Coarse crackles in base B, normal effort CV: RRR, no mgr GI: BS+, soft, nontender MSK: normal bulk and tone Neuro: awake, alert, no distress, MAEW   Resolved Hospital Problem list     Assessment & Plan:   Acute on chronic respiratory failure with hypoxia OSA not on CPAP at home Acute on chronic HFpEF Bilateral pleural effusions Pulmonary edema. No suspicion for pneumonitis, that she had back in 2020. Continue diuresis BIPAP qHS CXR prn If she is discharged home in any setting other than hospice then we can revisit getting her a CPAP machine at home.    Goals of Care DNR status DNR Palliative to see her today, we recommend hospice, see discussion from Dr. Ainsley Spinner note on 12/18  PCCM available PRN  Best Practice (right click and "Reselect all SmartList Selections" daily)    Code Status:  DNR Last date of multidisciplinary goals of care discussion [family updated by ccm 12/18]  Labs   CBC: Recent Labs  Lab 08/20/21 1142 08/20/21 1802 08/20/21 1911 08/21/21 0348 08/21/21 1500 08/22/21 0148 08/23/21 0222 08/24/21 0439 08/25/21 0141  WBC 6.1 10.2  --  6.4  --  9.1 8.5 7.0 7.6  NEUTROABS 4.9 8.6*  --   --   --   --   --  5.8 6.2  HGB 10.0* 10.7*   < > 9.2* 9.5* 8.7* 8.5* 8.3* 7.8*  HCT 29.8* 35.6*   < > 29.6* 28.0* 27.4* 26.7* 26.2* 25.3*  MCV 93.2 102.9*  --  99.7  --  97.9 99.6 98.9 98.1  PLT 143.0* 163  --  128*  --  128* 133* 134* 134*   < > = values in this interval not displayed.    Basic Metabolic Panel: Recent Labs  Lab 08/21/21 0348 08/21/21 1500 08/22/21 0148 08/23/21 0222 08/24/21 0439 08/25/21 0141  NA 143 145 144 145 143 140  K 4.2 4.1 3.9 3.6 3.3* 3.1*  CL 101  --  102 100 99 97*  CO2 33*  --  32 35* 34* 33*  GLUCOSE 105*  --  142* 157* 156* 155*  BUN 74*  --  81* 86* 96* 103*  CREATININE 2.73*  --  2.82* 3.09* 2.71* 2.68*  CALCIUM 8.9  --  9.0 9.0 8.9 8.8*  MG 2.1  --   --   --   --   --     GFR: Estimated Creatinine Clearance: 16.5 mL/min (A) (by C-G formula based on SCr of 2.68 mg/dL (H)). Recent Labs  Lab 08/20/21 1829 08/20/21 2005 08/21/21 0348 08/22/21 0148 08/23/21 0222 08/24/21 0439 08/25/21 0141  WBC  --   --    < > 9.1 8.5 7.0 7.6  LATICACIDVEN 0.8 0.9  --   --   --   --   --    < > = values in this interval not displayed.    Liver Function Tests: Recent Labs  Lab 08/20/21 1142 08/20/21 1802  AST 10 16  ALT 9 16  ALKPHOS 76 83  BILITOT 0.7 1.0  PROT 7.3 7.5  ALBUMIN 3.8 3.6   No results for input(s): LIPASE, AMYLASE in the last 168 hours. No results for input(s): AMMONIA in the last 168 hours.  Roselie Awkward, MD [place] [facility] Pager: [phone] Cell: [phone] After 7:00 pm call Elink  [phone]

## 2021-08-25 NOTE — Progress Notes (Signed)
Progress Note  Patient Name: [name] Date of Encounter: 08/25/2021  [facility] Cardiologist: Buford Dresser, MD   Subjective   No CP or dyspnea; complains of right knee pain  Inpatient Medications    Scheduled Meds:  amLODipine  5 mg Oral Daily   atorvastatin  40 mg Oral Daily   Chlorhexidine Gluconate Cloth  6 each Topical Q0600   heparin  5,000 Units Subcutaneous Q8H   hydrALAZINE  25 mg Oral Q8H   hyoscyamine  0.125 mg Oral Daily   insulin aspart  0-15 Units Subcutaneous TID WC   insulin aspart  0-5 Units Subcutaneous QHS   levothyroxine  112 mcg Oral QAC breakfast   liver oil-zinc oxide   Topical Daily   potassium chloride  40 mEq Oral Q4H   sertraline  100 mg Oral QHS   sodium chloride flush  3 mL Intravenous Q12H   Continuous Infusions:  sodium chloride Stopped (08/22/21 1926)   furosemide 100 mg (08/24/21 1941)   PRN Meds: sodium chloride, acetaminophen **OR** acetaminophen, hydrALAZINE, lidocaine, morphine injection, ondansetron **OR** ondansetron (ZOFRAN) IV, phenol, senna-docusate   Vital Signs    Vitals:   08/25/21 0000 08/25/21 0445 08/25/21 0452 08/25/21 0700  BP: (!) 145/41  (!) 147/43 (!) 124/38  Pulse:  (!) 54  (!) 51  Resp: 19 20 16  (!) 23  Temp: 98.9 F (37.2 C)  98.1 F (36.7 C)   TempSrc:   Oral   SpO2: 94% 95% 92% 98%  Weight:   95.7 kg   Height:        Intake/Output Summary (Last 24 hours) at 08/25/2021 0905 Last data filed at 08/25/2021 0200 Gross per 24 hour  Intake 438.13 ml  Output 500 ml  Net -61.87 ml   Last 3 Weights 08/25/2021 08/24/2021 08/23/2021  Weight (lbs) 210 lb 15.7 oz 226 lb 3.1 oz 213 lb 3 oz  Weight (kg) 95.7 kg 102.6 kg 96.7 kg      Telemetry    Sinus bradycardia to junctional bradycardia - Personally Reviewed   Physical Exam   GEN: No acute distress.   Neck: No JVD Cardiac: RRR Respiratory: Diminished BS RLL GI: Soft, nontender, non-distended  MS: No edema Neuro:   Nonfocal  Psych: Normal affect   Labs    High Sensitivity Troponin:   Recent Labs  Lab 08/20/21 1802 08/20/21 2005 08/21/21 0838 08/21/21 1055  TROPONINIHS 31* 37* 69* 59*     Chemistry Recent Labs  Lab 08/20/21 1142 08/20/21 1142 08/20/21 1802 08/20/21 1911 08/21/21 0348 08/21/21 1500 08/23/21 0222 08/24/21 0439 08/25/21 0141  NA 143  --  141   < > 143   < > 145 143 140  K 4.4  --  4.0   < > 4.2   < > 3.6 3.3* 3.1*  CL 99  --  99  --  101   < > 100 99 97*  CO2 38*  --  31  --  33*   < > 35* 34* 33*  GLUCOSE 117*  --  209*  --  105*   < > 157* 156* 155*  BUN 73*  --  71*  --  74*   < > 86* 96* 103*  CREATININE 2.35*  --  2.34*  --  2.73*   < > 3.09* 2.71* 2.68*  CALCIUM 9.7  --  9.3  --  8.9   < > 9.0 8.9 8.8*  MG  --   --   --   --  2.1  --   --   --   --   PROT 7.3  --  7.5  --   --   --   --   --   --   ALBUMIN 3.8  --  3.6  --   --   --   --   --   --   AST 10  --  16  --   --   --   --   --   --   ALT 9  --  16  --   --   --   --   --   --   ALKPHOS 76  --  83  --   --   --   --   --   --   BILITOT 0.7  --  1.0  --   --   --   --   --   --   GFRNONAA  --    < > 20*  --  17*   < > 14* 17* 17*  ANIONGAP  --    < > 11  --  9   < > 10 10 10    < > = values in this interval not displayed.     Hematology Recent Labs  Lab 08/23/21 0222 08/24/21 0439 08/25/21 0141  WBC 8.5 7.0 7.6  RBC 2.68* 2.65* 2.58*  HGB 8.5* 8.3* 7.8*  HCT 26.7* 26.2* 25.3*  MCV 99.6 98.9 98.1  MCH 31.7 31.3 30.2  MCHC 31.8 31.7 30.8  RDW 16.9* 17.0* 16.7*  PLT 133* 134* 134*   Thyroid  Recent Labs  Lab 08/20/21 1142  TSH 0.53    BNP Recent Labs  Lab 08/20/21 1142 08/20/21 2005 08/24/21 0439  BNP  --  237.9* 208.2*  PROBNP 279.0*  --   --       Radiology    DG CHEST PORT 1 VIEW  Result Date: 08/25/2021 CLINICAL DATA:  Pleural effusion EXAM: PORTABLE CHEST 1 VIEW COMPARISON:  Chest x-ray 08/23/2021 FINDINGS: Cardiomegaly and mediastinum appear unchanged. Calcified  plaques in the aortic arch. Mild central pulmonary vascular prominence with overall improved aeration of the lungs. Hazy opacities on the right likely represent small to moderate pleural effusion with associated atelectasis/infiltrate. Possible trace left pleural effusion and basilar atelectasis. No pneumothorax. IMPRESSION: Improving aeration of the lungs since previous study. Persistent pulmonary vascular congestion and pleural effusions right greater than left. Electronically Signed   By: Ofilia Neas M.D.   On: 08/25/2021 08:25    Cardiac Studies   Echo 08/22/21  1. Left ventricular ejection fraction, by estimation, is 60 to 65%. The  left ventricle has normal function. The left ventricle has no regional  wall motion abnormalities. There is moderate left ventricular hypertrophy.  Left ventricular diastolic  parameters are indeterminate.   2. Right ventricular systolic function is normal. The right ventricular  size is mildly enlarged. Tricuspid regurgitation signal is inadequate for  assessing PA pressure.   3. The mitral valve is grossly normal. No evidence of mitral valve  regurgitation.   4. The inferior vena cava is dilated in size with >50% respiratory  variability, suggesting right atrial pressure of 8 mmHg.   Conclusion(s)/Recommendation(s): No significant changes from prior echo  01/31/2021.   Patient Profile     86 y.o. female with chronic diastolic heart failure, PAF, chronic thrombocytopenia followed by Dr. Marin Olp, HTN, HLD, DM II, chronic respiratory failure on home O2, CKD stage  IV, hypothyroidism and OSA not compliant with CPAP who is being seen  for the evaluation of acute on chronic diastolic heart failure  Assessment & Plan    1 acute on chronic diastolic congestive heart failure-she continues to have decreased breath sounds right lower lobe consistent with previous pleural effusion.  However her chest x-ray is improving.  We will change Lasix to 80 mg IV twice  daily and continue to follow renal function.  Hopefully can transition to [facility] in the next 24 to 48 hours.  2 acute on chronic stage IV kidney disease-continue to follow renal function closely with diuresis.  BUN slightly increased compared to previous.  3 junctional bradycardia-she continues to have intermittent junctional bradycardia on telemetry but no prolonged pauses.  We will avoid AV nodal blocking agents.  No indication for pacemaker at present.  4 paroxysmal atrial fibrillation-as above she remains in sinus with intermittent junctional bradycardia.  No paroxysmal atrial fibrillation.  Avoid AV nodal blocking agents.  Previously felt not to be a candidate for anticoagulation.  5 chronic respiratory failure-contribution from obstructive sleep apnea and obesity hypoventilation syndrome as well as pulmonary venous hypertension from chronic diastolic congestive heart failure.  6 hypokalemia-supplement  For questions or updates, please contact [place] Please consult [email or web address] for contact info under        Signed, Kirk Ruths, MD  08/25/2021, 9:05 AM

## 2021-08-25 NOTE — Progress Notes (Signed)
Physical Therapy Treatment Patient Details Name: [name] MRN: [id number] DOB: [birth year] Today's Date: 08/25/2021   History of Present Illness Pt is an 86 y.o. female admitted 12/14 for acute on chronic respiratory failure with hypoxia and hypercapnia. PMH: OSA on CPAP, chronic diastolic CHF, PAF not anticoagulated due to history of GI bleed, ITP, IDA, CKD stage IV, IDDM, depression, and anxiety    PT Comments    Pt progressing towards goals and with improved tolerance for mobility. Pt with increased pain and unable to stand, but was able to laterally scoot towards HOB with max A +2. Oxygen sats at >95% on 9L. Pt with very mild SOB; much improved from previous session. Current recommendations for SNF appropriate. Will continue to follow acutely.      Recommendations for follow up therapy are one component of a multi-disciplinary discharge planning process, led by the attending physician.  Recommendations may be updated based on patient status, additional functional criteria and insurance authorization.  Follow Up Recommendations  Skilled nursing-short term rehab (<3 hours/day)     Assistance Recommended at Discharge Frequent or constant Supervision/Assistance  Equipment Recommendations  None recommended by PT    Recommendations for Other Services       Precautions / Restrictions Precautions Precautions: Fall;Other (comment) Precaution Comments: watch sats Restrictions Weight Bearing Restrictions: No     Mobility  Bed Mobility Overal bed mobility: Needs Assistance Bed Mobility: Supine to Sit;Sit to Supine     Supine to sit: Mod assist;+2 for physical assistance Sit to supine: Mod assist;+2 for physical assistance   General bed mobility comments: Required assist for trunk assist and scooting hips to EOB.    Transfers Overall transfer level: Needs assistance   Transfers: Bed to chair/wheelchair/BSC            Lateral/Scoot Transfers: Max assist;+2  physical assistance General transfer comment: Attempted to stand, but pt reporting too much pain in her knees. Was able to assist pt with laterally scooting X2 towards HOB with max A.    Ambulation/Gait                   Stairs             Wheelchair Mobility    Modified Rankin (Stroke Patients Only)       Balance Overall balance assessment: Needs assistance Sitting-balance support: Feet supported Sitting balance-Leahy Scale: Fair                                      Cognition Arousal/Alertness: Awake/alert Behavior During Therapy: WFL for tasks assessed/performed Overall Cognitive Status: Within Functional Limits for tasks assessed                                          Exercises General Exercises - Lower Extremity Ankle Circles/Pumps: AROM;Both;15 reps Quad Sets: AROM;Both;10 reps Long Arc Quad: AROM;Both;10 reps;Seated Heel Slides: AAROM;Both;10 reps    General Comments        Pertinent Vitals/Pain Pain Assessment: Faces Faces Pain Scale: Hurts little more Pain Location: bilat feet/ankles Pain Descriptors / Indicators: Other (Comment) (stiff) Pain Intervention(s): Limited activity within patient's tolerance;Monitored during session;Repositioned    Home Living  Prior Function            PT Goals (current goals can now be found in the care plan section) Acute Rehab PT Goals Patient Stated Goal: to get better PT Goal Formulation: With patient Time For Goal Achievement: 09/06/21 Potential to Achieve Goals: Fair Progress towards PT goals: Progressing toward goals    Frequency    Min 2X/week      PT Plan Frequency needs to be updated    Co-evaluation              AM-PAC PT "6 Clicks" Mobility   Outcome Measure  Help needed turning from your back to your side while in a flat bed without using bedrails?: A Lot Help needed moving from lying on your back to  sitting on the side of a flat bed without using bedrails?: Total Help needed moving to and from a bed to a chair (including a wheelchair)?: Total Help needed standing up from a chair using your arms (e.g., wheelchair or bedside chair)?: Total Help needed to walk in hospital room?: Total Help needed climbing 3-5 steps with a railing? : Total 6 Click Score: 7    End of Session Equipment Utilized During Treatment: Gait belt;Oxygen Activity Tolerance: Patient tolerated treatment well Patient left: in bed;with call bell/phone within reach;with bed alarm set Nurse Communication: Mobility status PT Visit Diagnosis: Other abnormalities of gait and mobility (R26.89);Muscle weakness (generalized) (M62.81)     Time: 4287-6811 PT Time Calculation (min) (ACUTE ONLY): 27 min  Charges:  $Therapeutic Exercise: 8-22 mins $Therapeutic Activity: 8-22 mins                     Lou Miner, DPT  Acute Rehabilitation Services  Pager: [phone] Office: [phone]    Rudean Hitt 08/25/2021, 1:11 PM

## 2021-08-25 NOTE — TOC Progression Note (Signed)
Transition of Care [facility]) - Progression Note    Patient Details  Name: [name] MRN: [id number] Date of Birth: [birth year]  [facility] CM/SW Contact  Bartholomew Crews, RN Phone Number: [phone] 08/25/2021, 2:51 PM  Clinical Narrative:     Spoke with patient at the bedside to confirm choice of hospice at home with [facility]. Consent to discuss details with her daughter, [name]. Spoke with [name] on the phone while at bedside.  Patient lives in a townhome. Everything patient needs is on the main level. Patient has a cpap but cannot tolerate. Was supposed to get fitted for new cpap but patient did not want to leave house for appointment. Patient also has an oxygen concentrator at home; however, concentrator not functioning. Confirmed with [employer] that a service ticket had been placed on 12/16 - advised that patient was currently in hospital.   Patient usually sleeps in her lift chair, but she is agreeable to trying a hospital bed.   Patient has private caregivers from [facility]. Discussed additional home care providers to complement current caregivers - referral to [facility]   [name] expressed concerns about her mom dying at home, but she wants to honor her mom's wishes.   Spoke with Misty with [facility] to discuss patient needs. Misty to follow up.   TOC following for transition needs.   Expected Discharge Plan: Home w Hospice Care Barriers to Discharge: Continued Medical Work up  Expected Discharge Plan and Services Expected Discharge Plan: Home w Hospice Care In-house Referral: Clinical Social Work Discharge Planning Services: CM Consult Post Acute Care Choice: Hospice Living arrangements for the past 2 months: Single Family Home                                       Social Determinants of Health (SDOH) Interventions    Readmission Risk Interventions Readmission Risk Prevention Plan 07/20/2019  Transportation Screening Complete  PCP  or Specialist Appt within 3-5 Days Not Complete  Not Complete comments unknown DC date  [place] or [place] Complete  Social Work Consult for [place] Planning/Counseling Not Complete  SW consult not completed comments following for needs  Palliative Care Screening Not Complete  Medication Review (RN Transport planner) Referral to Pharmacy  Some recent data might be hidden

## 2021-08-25 NOTE — Progress Notes (Signed)
Daily Progress Note   Patient Name: [name]       Date: 08/25/2021 DOB: 1935/01/13  Age: 86 y.o. MRN#: [id number] Attending Physician: Karie Kirks, DO Primary Care Physician: Caren Macadam, MD Admit Date: 08/20/2021 Length of Stay: 5 days  Reason for Consultation/Follow-up: Establishing goals of care  HPI/Patient Profile:  Palliative Care consult requested for goals of care discussion in this 86 y.o. female  with past medical history of  OSA on CPAP, chronic diastolic CHF, PAF not anticoagulated due to history of GI bleed, ITP, IDA, CKD stage IV, IDDM, depression, and anxiety. She was admitted on 08/20/2021 from home with shortness of breath and somnolence. Chest x-ray with worsening diffuse bilateral airspace disease and small bilateral pleural effusions  12/17 - elected DNR, planned to treat the treatable, take it one day at a time 12/18 - note from PCCM and chaplain indicates patient wishing to set limits on care as she's "tired" and daughters were supportive of this  Subjective:   Subjective: Chart Reviewed. Updates received. Patient Assessed. Created space and opportunity for patient and family to explore thoughts and feelings regarding current medical situation.  Today's Discussion: Patient at the bedside as well as her personal caregiver.  Her daughter was able to speak via video call.  Patient seems to be more awake and alert today.  She is clearly verbalizing her wishes.  Yesterday she was feeling bad and was asking questions about "could I make it and go on.  She did work a little bit with PT today.  Her daughter shared that she is much more appropriate today and she understands that her mom wants to make decisions and she wants to support her and doing this.  Her current medical situation.  I shared my worry is that while PT may be abnormal and skilled nursing facility for rehab-concerned that her breathing would not allow her to rehab much.  She seems  understand this.  We discussed aggressive versus comfort care.  She did freely share that she would not want a feeding tube, she does not want to go to a skilled nursing facility dialysis.  Her daughter verbalized her understanding of his wishes.  She states "I am tired and I am ready to go home today Lord."  Her daughter asked about possibly doing therapy and home she states "I just want to go home and do as much as I can but not questions.  She does not seem to want formal curative rehab.  We also had an extensive discussion about hospice services hospice facility, settings of care and options daughter had specific questions which I answered to the best my, but deferred some to the hospice liaison that is more to answer this.  They are interested in having BiPAP and as she feels less short of breath using BiPAP when she does her CPAP.  They have traditionally had 24-hour in-home caregivers another caregiver to assist and would like TOC assistance and resources to hire somebody.  They are also questioning about possible ramp due to steps needed to enter the home.  After our discussion, they have agreed to a hospice consult pending staging hospice services, dignity, comfort as we approached the end of life.  After discussion and shared my impressions that we want to continue current treatments but no escalation of care, involve hospice, no feeding tube, no skilled nursing facility.  They all agreed with this assessment.  Review of Systems  Constitutional:  Positive for appetite change (  Eating less, only wants orange sherbert) and fatigue.  Respiratory:  Positive for shortness of breath (intermittant, improves with BiPap).   Gastrointestinal:  Negative for abdominal pain, nausea and vomiting.   Objective:   Vital Signs:  BP (!) 124/38 (BP Location: Right Arm)    Pulse (!) 55    Temp 98.1 F (36.7 C) (Oral)    Resp 18    Ht 5\' 1"  (1.549 m)    Wt 95.7 kg    LMP  (LMP Unknown)    SpO2 94%    BMI 39.86  kg/m   Physical Exam: Physical Exam Vitals and nursing note reviewed.  Constitutional:      General: She is not in acute distress.    Appearance: She is ill-appearing.  HENT:     Head: Normocephalic and atraumatic.  Pulmonary:     Effort: No respiratory distress.     Breath sounds: No wheezing or rhonchi.     Comments: Appers somewhat short of breath at the end of the visit Abdominal:     General: Abdomen is protuberant.     Palpations: Abdomen is soft.     Tenderness: There is no abdominal tenderness.  Skin:    General: Skin is warm and dry.  Neurological:     General: No focal deficit present.     Mental Status: She is alert.  Psychiatric:        Mood and Affect: Mood normal.        Behavior: Behavior normal.    Palliative Assessment/Data: 40%   Assessment & Plan:   Impression: Present on Admission:  Acute on chronic respiratory failure with hypoxia and hypercapnia (HCC)  Sleep apnea  Physical deconditioning  Depression, recurrent (HCC)  Chronic kidney disease (CKD) stage G4/A1, severely decreased glomerular filtration rate (GFR) between 15-29 mL/min/1.73 square meter and albuminuria creatinine ratio less than 30 mg/g (HCC)  Chronic ITP (idiopathic thrombocytopenia) (HCC)  Acute on chronic diastolic CHF (congestive heart failure) (HCC)  Urinary tract infection, site not specified  Elevated troponin  Pressure injury of skin  Chronic cholecystitis  Diarrhea, unspecified  SUMMARY OF RECOMMENDATIONS   Remain DNR No escalation of care TOC referral for home hospice involvement PMT will continue to follow  Code Status: DNR  Prognosis: < 6 months  Discharge Planning: Home with Hospice  Discussed with: medical team, nursing team, PCCM team, [facility] team, patient, patient's family, patient's home caregiver  Thank you for allowing [place] to participate in the care of [name] PMT will continue to support holistically.  Time Total: 75 min  Visit  consisted of counseling and education dealing with the complex and emotionally intense issues of symptom management and palliative care in the setting of serious and potentially life-threatening illness. Greater than 50%  of this time was spent counseling and coordinating care related to the above assessment and plan.  Walden Field, NP Palliative Medicine Team  Team Phone # [phone] (Nights/Weekends)  05/06/2021, 8:17 AM

## 2021-08-26 ENCOUNTER — Telehealth: Payer: Self-pay | Admitting: Family Medicine

## 2021-08-26 DIAGNOSIS — R5381 Other malaise: Secondary | ICD-10-CM

## 2021-08-26 LAB — BASIC METABOLIC PANEL
Anion gap: 8 (ref 5–15)
BUN: 105 mg/dL — ABNORMAL HIGH (ref 8–23)
CO2: 33 mmol/L — ABNORMAL HIGH (ref 22–32)
Calcium: 8.9 mg/dL (ref 8.9–10.3)
Chloride: 99 mmol/L (ref 98–111)
Creatinine, Ser: 2.4 mg/dL — ABNORMAL HIGH (ref 0.44–1.00)
GFR, Estimated: 19 mL/min — ABNORMAL LOW (ref >60)
Glucose, Bld: 165 mg/dL — ABNORMAL HIGH (ref 70–99)
Potassium: 3.8 mmol/L (ref 3.5–5.1)
Sodium: 140 mmol/L (ref 135–145)

## 2021-08-26 LAB — GLUCOSE, CAPILLARY
Glucose-Capillary: 171 mg/dL — ABNORMAL HIGH (ref 70–99)
Glucose-Capillary: 174 mg/dL — ABNORMAL HIGH (ref 70–99)
Glucose-Capillary: 212 mg/dL — ABNORMAL HIGH (ref 70–99)
Glucose-Capillary: 172 mg/dL — ABNORMAL HIGH (ref 70–99)

## 2021-08-26 MED ORDER — MORPHINE SULFATE (PF) 2 MG/ML IV SOLN
2.0000 mg | INTRAVENOUS | Status: DC | PRN
  Administered 2021-08-26 – 2021-08-27 (×4): 2 mg via INTRAVENOUS
  Filled 2021-08-26 (×4): qty 1

## 2021-08-26 NOTE — Telephone Encounter (Signed)
Tammy intake specialist with [facility]  hospice is calling to see if dr Ethlyn Gallery would be pt hospice attending physician

## 2021-08-26 NOTE — TOC Progression Note (Addendum)
Transition of Care [facility]) - Progression Note    Patient Details  Name: Feven Alderfer MRN: [id number] Date of Birth: [birth year]  Transition of Care [facility]) CM/SW Contact  Zenon Mayo, RN Phone Number: 08/26/2021, 4:39 PM  Clinical Narrative:     CHF on bipap, plan is home with hospice on Wed.  [facility] to provide bipap. Latanya Presser will let this NCM know when the bipap will be delivered. Per Latanya Presser it will be delivered in the am.    Expected Discharge Plan: Home w Hospice Care Barriers to Discharge: Continued Medical Work up  Expected Discharge Plan and Services Expected Discharge Plan: Home w Hospice Care In-house Referral: Clinical Social Work Discharge Planning Services: CM Consult Post Acute Care Choice: Hospice Living arrangements for the past 2 months: Single Family Home                                       Social Determinants of Health (SDOH) Interventions    Readmission Risk Interventions Readmission Risk Prevention Plan 07/20/2019  Transportation Screening Complete  PCP or Specialist Appt within 3-5 Days Not Complete  Not Complete comments unknown DC date  [place] or [place] Complete  Social Work Consult for [place] Planning/Counseling Not Complete  SW consult not completed comments following for needs  Palliative Care Screening Not Complete  Medication Review (RN Transport planner) Referral to Pharmacy  Some recent data might be hidden

## 2021-08-26 NOTE — Progress Notes (Signed)
Heart Failure Navigator Progress Note  Assessed for Heart & Vascular TOC clinic readiness.  Patient does not meet criteria due to [facility] hospice care involved, planned to follow upon discharge.   Navigator available for reassessment of patient.   Pricilla Holm, MSN, RN Heart Failure Nurse Navigator [phone]

## 2021-08-26 NOTE — Progress Notes (Signed)
Progress Note    [name]   IRJ:[id number]  DOB: 07-23-1935  DOA: 08/20/2021     6 PCP: Caren Macadam, MD  Initial CC: SOB  Hospital Course:  [name] is a pleasant 86 y.o. female with medical history significant for OSA on CPAP, chronic diastolic CHF, PAF not anticoagulated due to history of GI bleed, ITP, IDA, CKD stage IV, IDDM, depression, and anxiety, who presented to the ER with worsening shortness of breath and somnolence.  Patient lives alone but has daughters who check on her frequently and have noted that the patient has become progressively short of breath over the past week or more and has been progressively fatigued and now somnolent.  She had increased leg swelling and reported a sensation of fullness around her abdomen, had her diuretic increased 2 days ago and lost 7 pounds since then but continued to have increased shortness of breath and somnolence.  She uses a walker at baseline.  For the past month has wanted family to put her in a wheelchair, had not been taking her diuretic consistently due to not wanting to have to go to the restroom, and had been urinating on herself.   The patient had been seen by pulmonology, PCP, and oncology this week prior to presentation in the ED.    Upon arrival to the ED, patient is found to be afebrile, saturating 86% on 6 L/min of supplemental oxygen, and with initial SBP of 215.  EKG features accelerated junctional rhythm. Chest x-ray with worsening diffuse bilateral airspace disease and small bilateral pleural effusions. Patient was placed on BiPAP and treated with IV Lasix, Rocephin, and briefly with nitroglycerin infusion in the ED.   The patient was found to have a UTI. She was started on Rocephin.  Initial troponins were indeterminately elevated.  Cardiology was consulted as well.    Pulmonology has been consulted. They have recommended continued efforts to diurese the patient and to wean off of  BIPAP.   Palliative care has been consulted. The patient has declined MRI to investigate abnormalities of gallbladder. She would have difficulty breathing lying flat for the study.   Ultimately, decision was made for transitioning patient to hospice at home upon discharge.  Interval History:  No events overnight.  Resting in bed comfortable but still short of breath this morning.  She was asking for her BiPAP mask to be placed back on to help with this.  Assessment & Plan:  Chronic ITP (idiopathic thrombocytopenia) (HCC) Platelets are stable. They have ranged from 107 to 140 in the past 60 days. NPlate was not given on her most recent visit to Oncology, although she did receive an iron infusion or on admission. Monitor.   Chronic kidney disease (CKD) stage G4/A1, severely decreased glomerular filtration rate (GFR) between 15-29 mL/min/1.73 square meter and albuminuria creatinine ratio less than 30 mg/g (HCC) Pt has AKI on CKD G4A1 with severely decreased filtration rate (15-29 mL/min/1.73 msquared) with albumin/creatinine ration less than 30 mg/g. Creatinine is increased from baseline of 1.80 to 3.09. . -Medical renal disease noted on renal ultrasound   Depression, recurrent ([place]) Continue Zoloft as at home.   DM type 2 (diabetes mellitus, type 2) ([place]) At home the patient's glucoses have been controlled with Lantus 46 Units qhs, Trulicity 3.01 mg Qwk, Lispro prn meals.  -Continue SSI and CBG monitoring - A1c 6% on 08/21/2021   Physical deconditioning Noted. The patient has been evaluated by PT/OT. Their recommendation is for  discharge to SNF for rehab. -Patient is electing for discharging home with hospice.  Once equipment delivered, should be okay for discharging home, possibly Wednesday   Sleep apnea Pt uses CPAP at home, but has been transitioned to BiPAP with better relief during hospitalization.  BiPAP order has been placed for home delivery prior to discharge   Urinary tract  infection, site not specified Urine has grown out klebsiella pneumoniae that is sensitive to Rocephin. The patient has completed her course of antibiotics.   Acute on chronic diastolic CHF (congestive heart failure) (HCC) Echocardiogram from 01/2021 demonstrated an EF of 60-65%. Diastolic function could not be evaluated on that study. Echo from 06/2019 demonstrated an EF of 50-55% with Grade 1 diastolic function.  -Repeat echo shows EF 60 to 65%, moderate LVH, indeterminate diastology -Continue Lasix   Acute on chronic respiratory failure with hypoxia and hypercapnia (HCC) Pt presented in ED with hypoxic and hypercapneic respiratory failure. She was placed on BIPAP.  -Continue BiPAP as needed and oxygen   Elevated troponin Initial troponin 31, then 37, then 69.  - considered demand ischemia    Chronic cholecystitis Renal ultrasound demonstrated cholelithiasis as well as abnormalities of the gallbladder. MRI was suggested by radiology as a means of further invstigating this abnormal appearance. It could be chronic cholecystitis vs malignancy. However, the patient has stated that she is not interested in MRI to look further at this time. Whatever the etiology, the patient appears to be asymptomatic at this time. Earlier complaints of abdominal pain were resolved after a BM.    Old records reviewed in assessment of this patient  Antimicrobials:   DVT prophylaxis: HSQ  Code Status:   Code Status: DNR  Disposition Plan:   Status is: Inpt  Objective: Blood pressure (!) 148/46, pulse (!) 56, temperature 97.9 F (36.6 C), temperature source Axillary, resp. rate 16, height 5\' 1"  (1.549 m), weight 96.6 kg, SpO2 92 %.  Examination:  Physical Exam Constitutional:      General: She is not in acute distress.    Comments: Does appear dyspneic some   HENT:     Head: Normocephalic and atraumatic.     Mouth/Throat:     Mouth: Mucous membranes are moist.  Eyes:     Extraocular Movements:  Extraocular movements intact.  Cardiovascular:     Rate and Rhythm: Normal rate and regular rhythm.  Pulmonary:     Effort: No respiratory distress.     Breath sounds: Rhonchi present.  Abdominal:     General: Bowel sounds are normal. There is no distension.     Palpations: Abdomen is soft.     Tenderness: There is no abdominal tenderness.  Musculoskeletal:        General: Normal range of motion.     Cervical back: Normal range of motion and neck supple.  Skin:    General: Skin is warm and dry.  Neurological:     General: No focal deficit present.     Mental Status: She is alert.  Psychiatric:        Mood and Affect: Mood normal.     Consultants:  Cardiology Pulm  Procedures:    Data Reviewed: I have personally reviewed labs and imaging studies    LOS: 6 days  Time spent: Greater than 50% of the 35 minute visit was spent in counseling/coordination of care for the patient as laid out in the A&P.   Dwyane Dee, MD [facility] 08/26/2021, 1:41 PM

## 2021-08-26 NOTE — Progress Notes (Signed)
Progress Note  Patient Name: [name] Date of Encounter: 08/26/2021  [facility] Cardiologist: Buford Dresser, MD   Subjective   More dyspneic this AM; no CP  Inpatient Medications    Scheduled Meds:  amLODipine  5 mg Oral Daily   atorvastatin  40 mg Oral Daily   Chlorhexidine Gluconate Cloth  6 each Topical Q0600   furosemide  80 mg Intravenous BID   heparin  5,000 Units Subcutaneous Q8H   hydrALAZINE  25 mg Oral Q8H   hyoscyamine  0.125 mg Oral Daily   insulin aspart  0-15 Units Subcutaneous TID WC   insulin aspart  0-5 Units Subcutaneous QHS   levothyroxine  112 mcg Oral QAC breakfast   liver oil-zinc oxide   Topical Daily   sertraline  100 mg Oral QHS   sodium chloride flush  3 mL Intravenous Q12H   Continuous Infusions:  sodium chloride Stopped (08/22/21 1926)   PRN Meds: sodium chloride, acetaminophen **OR** acetaminophen, hydrALAZINE, lidocaine, morphine injection, ondansetron **OR** ondansetron (ZOFRAN) IV, phenol, senna-docusate   Vital Signs    Vitals:   08/26/21 0237 08/26/21 0456 08/26/21 0500 08/26/21 0700  BP:  (!) 152/44  (!) 145/48  Pulse: (!) 54   (!) 54  Resp: (!) 22 19  19   Temp:  97.7 F (36.5 C)  97.8 F (36.6 C)  TempSrc:  Oral  Oral  SpO2: 96% 90%  (!) 88%  Weight:   96.6 kg   Height:        Intake/Output Summary (Last 24 hours) at 08/26/2021 0833 Last data filed at 08/26/2021 0500 Gross per 24 hour  Intake 1074 ml  Output 450 ml  Net 624 ml    Last 3 Weights 08/26/2021 08/25/2021 08/24/2021  Weight (lbs) 212 lb 15.4 oz 210 lb 15.7 oz 226 lb 3.1 oz  Weight (kg) 96.6 kg 95.7 kg 102.6 kg      Telemetry    Sinus bradycardia to junctional bradycardia - Personally Reviewed   Physical Exam   GEN: Obese; NAD Neck: supple Cardiac: bradycardic Respiratory: Diminished BS RLL; no wheeze GI: Soft, NT/ND MS: No edema Neuro:  Grossly intact Psych: Normal affect   Labs    High Sensitivity Troponin:    Recent Labs  Lab 08/20/21 1802 08/20/21 2005 08/21/21 0838 08/21/21 1055  TROPONINIHS 31* 37* 69* 59*      Chemistry Recent Labs  Lab 08/20/21 1142 08/20/21 1142 08/20/21 1802 08/20/21 1911 08/21/21 0348 08/21/21 1500 08/24/21 0439 08/25/21 0141 08/26/21 0156  NA 143  --  141   < > 143   < > 143 140 140  K 4.4  --  4.0   < > 4.2   < > 3.3* 3.1* 3.8  CL 99  --  99  --  101   < > 99 97* 99  CO2 38*  --  31  --  33*   < > 34* 33* 33*  GLUCOSE 117*  --  209*  --  105*   < > 156* 155* 165*  BUN 73*  --  71*  --  74*   < > 96* 103* 105*  CREATININE 2.35*  --  2.34*  --  2.73*   < > 2.71* 2.68* 2.40*  CALCIUM 9.7  --  9.3  --  8.9   < > 8.9 8.8* 8.9  MG  --   --   --   --  2.1  --   --   --   --  PROT 7.3  --  7.5  --   --   --   --   --   --   ALBUMIN 3.8  --  3.6  --   --   --   --   --   --   AST 10  --  16  --   --   --   --   --   --   ALT 9  --  16  --   --   --   --   --   --   ALKPHOS 76  --  83  --   --   --   --   --   --   BILITOT 0.7  --  1.0  --   --   --   --   --   --   GFRNONAA  --    < > 20*  --  17*   < > 17* 17* 19*  ANIONGAP  --    < > 11  --  9   < > 10 10 8    < > = values in this interval not displayed.      Hematology Recent Labs  Lab 08/23/21 0222 08/24/21 0439 08/25/21 0141  WBC 8.5 7.0 7.6  RBC 2.68* 2.65* 2.58*  HGB 8.5* 8.3* 7.8*  HCT 26.7* 26.2* 25.3*  MCV 99.6 98.9 98.1  MCH 31.7 31.3 30.2  MCHC 31.8 31.7 30.8  RDW 16.9* 17.0* 16.7*  PLT 133* 134* 134*    Thyroid  Recent Labs  Lab 08/20/21 1142  TSH 0.53     BNP Recent Labs  Lab 08/20/21 1142 08/20/21 2005 08/24/21 0439  BNP  --  237.9* 208.2*  PROBNP 279.0*  --   --        Radiology    DG CHEST PORT 1 VIEW  Result Date: 08/25/2021 CLINICAL DATA:  Pleural effusion EXAM: PORTABLE CHEST 1 VIEW COMPARISON:  Chest x-ray 08/23/2021 FINDINGS: Cardiomegaly and mediastinum appear unchanged. Calcified plaques in the aortic arch. Mild central pulmonary vascular  prominence with overall improved aeration of the lungs. Hazy opacities on the right likely represent small to moderate pleural effusion with associated atelectasis/infiltrate. Possible trace left pleural effusion and basilar atelectasis. No pneumothorax. IMPRESSION: Improving aeration of the lungs since previous study. Persistent pulmonary vascular congestion and pleural effusions right greater than left. Electronically Signed   By: Ofilia Neas M.D.   On: 08/25/2021 08:25    Cardiac Studies   Echo 08/22/21  1. Left ventricular ejection fraction, by estimation, is 60 to 65%. The  left ventricle has normal function. The left ventricle has no regional  wall motion abnormalities. There is moderate left ventricular hypertrophy.  Left ventricular diastolic  parameters are indeterminate.   2. Right ventricular systolic function is normal. The right ventricular  size is mildly enlarged. Tricuspid regurgitation signal is inadequate for  assessing PA pressure.   3. The mitral valve is grossly normal. No evidence of mitral valve  regurgitation.   4. The inferior vena cava is dilated in size with >50% respiratory  variability, suggesting right atrial pressure of 8 mmHg.   Conclusion(s)/Recommendation(s): No significant changes from prior echo  01/31/2021.   Patient Profile     86 y.o. female with chronic diastolic heart failure, PAF, chronic thrombocytopenia followed by Dr. Marin Olp, HTN, HLD, DM II, chronic respiratory failure on home O2, CKD stage IV, hypothyroidism and OSA not compliant with CPAP who is  being seen  for the evaluation of acute on chronic diastolic heart failure  Assessment & Plan    1 acute on chronic diastolic congestive heart failure-patient more dyspneic this morning.  Continue diuresis with Lasix 80 mg IV twice daily and follow renal function.    2 acute on chronic stage IV kidney disease-renal function unchanged compared to yesterday but worse compared to admission.  May  need nephrology evaluation.  We will leave to primary service.  3 junctional bradycardia-she continues to have intermittent junctional bradycardia on telemetry but no prolonged pauses.  We will avoid AV nodal blocking agents.  No indication for pacemaker at present.  4 paroxysmal atrial fibrillation-as above she remains in sinus with intermittent junctional bradycardia.  No paroxysmal atrial fibrillation.  Avoid AV nodal blocking agents.  Previously felt not to be a candidate for anticoagulation.  5 chronic respiratory failure-contribution from obstructive sleep apnea and obesity hypoventilation syndrome as well as pulmonary venous hypertension from chronic diastolic congestive heart failure.  Continue diuresis as tolerated.  6 hypokalemia-improved.  For questions or updates, please contact [place] Please consult [email or web address] for contact info under        Signed, Kirk Ruths, MD  08/26/2021, 8:33 AM

## 2021-08-27 ENCOUNTER — Telehealth: Payer: Self-pay | Admitting: Family Medicine

## 2021-08-27 LAB — GLUCOSE, CAPILLARY
Glucose-Capillary: 176 mg/dL — ABNORMAL HIGH (ref 70–99)
Glucose-Capillary: 162 mg/dL — ABNORMAL HIGH (ref 70–99)

## 2021-08-27 MED ORDER — MORPHINE SULFATE 10 MG/5ML PO SOLN: 2.5000 mg | ORAL | 0 refills | Status: AC | PRN

## 2021-08-27 MED ORDER — FUROSEMIDE 40 MG PO TABS: 80.0000 mg | ORAL_TABLET | Freq: Two times a day (BID) | ORAL | Status: DC

## 2021-08-27 MED ORDER — TORSEMIDE 40 MG PO TABS: 40.0000 mg | ORAL_TABLET | Freq: Two times a day (BID) | ORAL | 0 refills | Status: AC

## 2021-08-27 MED ORDER — INSULIN LISPRO (1 UNIT DIAL) 100 UNIT/ML (KWIKPEN): PEN_INJECTOR | SUBCUTANEOUS | 1 refills | Status: AC

## 2021-08-27 NOTE — Telephone Encounter (Signed)
Spoke with Tammy and informed her of the approval as below.

## 2021-08-27 NOTE — Progress Notes (Signed)
Occupational Therapy Treatment Patient Details Name: [name] MRN: [id number] DOB: 07-18-1935 Today's Date: 08/27/2021   History of present illness Pt is an 86 y.o. female admitted 12/14 for acute on chronic respiratory failure with hypoxia and hypercapnia. PMH: OSA on CPAP, chronic diastolic CHF, PAF not anticoagulated due to history of GI bleed, ITP, IDA, CKD stage IV, IDDM, depression, and anxiety   OT comments  Session focused on caregiver education for optimal ADL completion at home as plan is for pt to return home under hospice care. Pt's daughter and caregiver present. Pt lethargic and on BiPAP with low tolerance to being off of BiPAP. Educated/recommended inc: performance of relevant ADLs bed level, frequent repositioning and skin integrity strategies (reports air mattress/hospital bed delivered to home), monitoring of O2 sats/SOB, and use of mechanical lift for any OOB transfers if pt desires.    Recommendations for follow up therapy are one component of a multi-disciplinary discharge planning process, led by the attending physician.  Recommendations may be updated based on patient status, additional functional criteria and insurance authorization.    Follow Up Recommendations  Other (comment) (home with hospice and caregiver support)    Assistance Recommended at Discharge Frequent or constant Supervision/Assistance  Equipment Recommendations  Hospital bed    Recommendations for Other Services      Precautions / Restrictions Precautions Precautions: Fall;Other (comment) Precaution Comments: watch sats Restrictions Weight Bearing Restrictions: No       Mobility Bed Mobility               General bed mobility comments: deferred, pt lethargic    Transfers                         Balance                                           ADL either performed or assessed with clinical judgement   ADL Overall ADL's : Needs  assistance/impaired                                       General ADL Comments: Pt's daughter at bedside along with caregiver who has been assisting MWF (may increase now that pt discharging home with hospice). Focused on education of ADL completion bed level, pressure relief strategies (reports air mattress/hospital bed at home) and encouraged frequent repositioning. Noted pt bedbound but if desire for transfer to wheelchair arised, recommended hoyer lift. Pt noted with L lateral cervical positioning resting in bed, encouraged repositioning within pt tolerance. educated caregiver on optimal O2 sats though noted limited tolerance off of BiPAP    Extremity/Trunk Assessment Upper Extremity Assessment Upper Extremity Assessment: Generalized weakness   Lower Extremity Assessment Lower Extremity Assessment: Defer to PT evaluation        Vision   Vision Assessment?: No apparent visual deficits   Perception     Praxis      Cognition Arousal/Alertness: Lethargic Behavior During Therapy: Flat affect Overall Cognitive Status: Difficult to assess                                 General Comments: progressed lethargy during admission  Exercises     Shoulder Instructions       General Comments      Pertinent Vitals/ Pain       Pain Assessment: Faces Faces Pain Scale: No hurt  Home Living                                          Prior Functioning/Environment              Frequency  Min 2X/week        Progress Toward Goals  OT Goals(current goals can now be found in the care plan section)  Progress towards OT goals: Not progressing toward goals - comment  Acute Rehab OT Goals Patient Stated Goal: none stated OT Goal Formulation: With patient/family Time For Goal Achievement: 09/06/21 Potential to Achieve Goals: Fair ADL Goals Pt Will Perform Eating: with modified independence;sitting Pt Will Perform  Grooming: with modified independence;sitting Pt Will Perform Upper Body Bathing: with min assist;sitting Pt Will Perform Lower Body Bathing: with mod assist;sitting/lateral leans;sit to/from stand Pt Will Perform Upper Body Dressing: with min assist;sitting Pt Will Perform Lower Body Dressing: with mod assist;sitting/lateral leans;sit to/from stand Pt Will Perform Toileting - Clothing Manipulation and hygiene: with mod assist;sitting/lateral leans;sit to/from stand Pt/caregiver will Perform Home Exercise Program: Increased strength;Increased ROM;Both right and left upper extremity;With Supervision Additional ADL Goal #1: Pt will verbalize/demonstrate 3 fall prevention strategies to incorproate into ADLs in order to increase safety awareness with min-mod assist overall. Additional ADL Goal #2: Pt will verbalize/demonstrate 3 energy conservation strategies to incorproate into ADLs in order to increase activity tolerance with min-mod assist overall.  Plan Discharge plan needs to be updated    Co-evaluation                 AM-PAC OT "6 Clicks" Daily Activity     Outcome Measure   Help from another person eating meals?: A Little Help from another person taking care of personal grooming?: A Little Help from another person toileting, which includes using toliet, bedpan, or urinal?: Total Help from another person bathing (including washing, rinsing, drying)?: A Lot Help from another person to put on and taking off regular upper body clothing?: A Lot Help from another person to put on and taking off regular lower body clothing?: Total 6 Click Score: 12    End of Session Equipment Utilized During Treatment: Other (comment) (BiPAP)  OT Visit Diagnosis: Muscle weakness (generalized) (M62.81);Pain   Activity Tolerance Patient limited by fatigue;Patient limited by lethargy;Treatment limited secondary to medical complications (Comment)   Patient Left in bed;with call bell/phone within  reach;with family/visitor present   Nurse Communication          Time: 2725-3664 OT Time Calculation (min): 15 min  Charges: OT General Charges $OT Visit: 1 Visit OT Treatments $Self Care/Home Management : 8-22 mins  Malachy Chamber, OTR/L Acute Rehab Services Office: [phone]   Layla Maw 08/27/2021, 10:55 AM

## 2021-08-27 NOTE — Progress Notes (Signed)
Progress Note  Patient Name: [name] Date of Encounter: 08/27/2021  [facility] Cardiologist: Buford Dresser, MD   Subjective   On BiPAP this morning.  Short of breath when off of BiPAP.  No chest pain.  Inpatient Medications    Scheduled Meds:  amLODipine  5 mg Oral Daily   atorvastatin  40 mg Oral Daily   Chlorhexidine Gluconate Cloth  6 each Topical Q0600   furosemide  80 mg Intravenous BID   heparin  5,000 Units Subcutaneous Q8H   hydrALAZINE  25 mg Oral Q8H   hyoscyamine  0.125 mg Oral Daily   insulin aspart  0-15 Units Subcutaneous TID WC   insulin aspart  0-5 Units Subcutaneous QHS   levothyroxine  112 mcg Oral QAC breakfast   liver oil-zinc oxide   Topical Daily   sertraline  100 mg Oral QHS   sodium chloride flush  3 mL Intravenous Q12H   Continuous Infusions:  sodium chloride Stopped (08/22/21 1926)   PRN Meds: sodium chloride, acetaminophen **OR** acetaminophen, hydrALAZINE, lidocaine, morphine injection, ondansetron **OR** ondansetron (ZOFRAN) IV, phenol, senna-docusate   Vital Signs    Vitals:   08/27/21 0350 08/27/21 0400 08/27/21 0741 08/27/21 0806  BP:  (!) 134/34 (!) 155/39   Pulse: (!) 57 61 64 62  Resp: 18 18 20  (!) 31  Temp:  98.7 F (37.1 C)    TempSrc:  Oral Oral   SpO2: 95% 94% 93% 93%  Weight:  95.5 kg    Height:        Intake/Output Summary (Last 24 hours) at 08/27/2021 0822 Last data filed at 08/27/2021 0412 Gross per 24 hour  Intake 768.77 ml  Output 1050 ml  Net -281.23 ml    Last 3 Weights 08/27/2021 08/26/2021 08/25/2021  Weight (lbs) 210 lb 8.6 oz 212 lb 15.4 oz 210 lb 15.7 oz  Weight (kg) 95.5 kg 96.6 kg 95.7 kg      Telemetry    Sinus bradycardia to junctional bradycardia - Personally Reviewed   Physical Exam   GEN: WD Obese NAD Neck: supple, JVP difficult Cardiac: bradycardic Respiratory: Diminished BS RLL; no rhonchi GI: Soft, NT/ND, no masses MS: No edema Neuro:  No focal  findings Psych: Normal affect   Labs    High Sensitivity Troponin:   Recent Labs  Lab 08/20/21 1802 08/20/21 2005 08/21/21 0838 08/21/21 1055  TROPONINIHS 31* 37* 69* 59*      Chemistry Recent Labs  Lab 08/20/21 1142 08/20/21 1142 08/20/21 1802 08/20/21 1911 08/21/21 0348 08/21/21 1500 08/24/21 0439 08/25/21 0141 08/26/21 0156  NA 143  --  141   < > 143   < > 143 140 140  K 4.4  --  4.0   < > 4.2   < > 3.3* 3.1* 3.8  CL 99  --  99  --  101   < > 99 97* 99  CO2 38*  --  31  --  33*   < > 34* 33* 33*  GLUCOSE 117*  --  209*  --  105*   < > 156* 155* 165*  BUN 73*  --  71*  --  74*   < > 96* 103* 105*  CREATININE 2.35*  --  2.34*  --  2.73*   < > 2.71* 2.68* 2.40*  CALCIUM 9.7  --  9.3  --  8.9   < > 8.9 8.8* 8.9  MG  --   --   --   --  2.1  --   --   --   --   PROT 7.3  --  7.5  --   --   --   --   --   --   ALBUMIN 3.8  --  3.6  --   --   --   --   --   --   AST 10  --  16  --   --   --   --   --   --   ALT 9  --  16  --   --   --   --   --   --   ALKPHOS 76  --  83  --   --   --   --   --   --   BILITOT 0.7  --  1.0  --   --   --   --   --   --   GFRNONAA  --    < > 20*  --  17*   < > 17* 17* 19*  ANIONGAP  --    < > 11  --  9   < > 10 10 8    < > = values in this interval not displayed.      Hematology Recent Labs  Lab 08/23/21 0222 08/24/21 0439 08/25/21 0141  WBC 8.5 7.0 7.6  RBC 2.68* 2.65* 2.58*  HGB 8.5* 8.3* 7.8*  HCT 26.7* 26.2* 25.3*  MCV 99.6 98.9 98.1  MCH 31.7 31.3 30.2  MCHC 31.8 31.7 30.8  RDW 16.9* 17.0* 16.7*  PLT 133* 134* 134*    Thyroid  Recent Labs  Lab 08/20/21 1142  TSH 0.53     BNP Recent Labs  Lab 08/20/21 1142 08/20/21 2005 08/24/21 0439  BNP  --  237.9* 208.2*  PROBNP 279.0*  --   --         Cardiac Studies   Echo 08/22/21  1. Left ventricular ejection fraction, by estimation, is 60 to 65%. The  left ventricle has normal function. The left ventricle has no regional  wall motion abnormalities. There is  moderate left ventricular hypertrophy.  Left ventricular diastolic  parameters are indeterminate.   2. Right ventricular systolic function is normal. The right ventricular  size is mildly enlarged. Tricuspid regurgitation signal is inadequate for  assessing PA pressure.   3. The mitral valve is grossly normal. No evidence of mitral valve  regurgitation.   4. The inferior vena cava is dilated in size with >50% respiratory  variability, suggesting right atrial pressure of 8 mmHg.   Conclusion(s)/Recommendation(s): No significant changes from prior echo  01/31/2021.   Patient Profile     87 y.o. female with chronic diastolic heart failure, PAF, chronic thrombocytopenia followed by Dr. Marin Olp, HTN, HLD, DM II, chronic respiratory failure on home O2, CKD stage IV, hypothyroidism and OSA not compliant with CPAP who is being seen  for the evaluation of acute on chronic diastolic heart failure  Assessment & Plan    1 acute on chronic diastolic congestive heart failure-decision has been made to discharge with hospice care.  We will change Lasix to 80 mg by mouth twice daily.  If we are moving to comfort care this can be discontinued.  2 acute on chronic stage IV kidney disease-if patient transitioning to hospice care there is no need for nephrology consult and would not follow renal function.  3 junctional bradycardia-she continues to have intermittent junctional bradycardia on telemetry  but no prolonged pauses.  We will avoid AV nodal blocking agents.    4 paroxysmal atrial fibrillation-as above she remains in sinus with intermittent junctional bradycardia.  No paroxysmal atrial fibrillation.  Avoid AV nodal blocking agents.  Previously felt not to be a candidate for anticoagulation.  5 chronic respiratory failure-contribution from obstructive sleep apnea and obesity hypoventilation syndrome as well as pulmonary venous hypertension from chronic diastolic congestive heart failure.   Cardiology  will sign off.  Please call with questions.  She does not require outpatient cardiology follow-up.  For questions or updates, please contact [place] Please consult [email or web address] for contact info under        Signed, Kirk Ruths, MD  08/27/2021, 8:22 AM

## 2021-08-27 NOTE — Telephone Encounter (Signed)
Patient's daughter called in to see if Dr.Koberlein could transfer morphine to [facility] on [address]. Patient was just released from hospital and original pharmacy doesn't have the morphine in stocked.  [name] stated that the patient needed the medication tonight.  [name] would like to be contacted at [phone].  Please advise.

## 2021-08-27 NOTE — Telephone Encounter (Signed)
Yes that is fine

## 2021-08-27 NOTE — Discharge Summary (Signed)
Physician Discharge Summary  Klaira Pesci QQI:[id number] DOB: 1934-12-17 DOA: 08/20/2021  PCP: Caren Macadam, MD  Admit date: 08/20/2021 Discharge date: 08/27/2021 30 Day Unplanned Readmission Risk Score    Flowsheet Row ED to Hosp-Admission (Current) from 08/20/2021 in [facility]  30 Day Unplanned Readmission Risk Score (%) 25.02 Filed at 08/27/2021 0801       This score is the patient's risk of an unplanned readmission within 30 days of being discharged (0 -100%). The score is based on dignosis, age, lab data, medications, orders, and past utilization.   Low:  0-14.9   Medium: 15-21.9   High: 22-29.9   Extreme: 30 and above          Admitted From: Home Disposition: Home with hospice  Recommendations for Outpatient Follow-up:  Follow up with PCP in 1-2 weeks Please obtain BMP/CBC in one week Please follow up with your PCP on the following pending results: Unresulted Labs (From admission, onward)     Start     Ordered   Unscheduled  Occult blood card to lab, stool  As needed,   R      08/22/21 0949              Home Health: None Equipment/Devices: Multiple DME equipments  Discharge Condition: Guarded CODE STATUS: DNR Diet recommendation: As pleased  Subjective: Seen and examined.  Daughter at the bedside.  Patient lethargic on BiPAP.  Brief/Interim Summary: Heidie Krall is a pleasant 86 y.o. female with medical history significant for OSA on CPAP, chronic diastolic CHF, PAF not anticoagulated due to history of GI bleed, ITP, IDA, CKD stage IV, IDDM, depression, and anxiety, who presented to the ER with worsening shortness of breath and somnolence.  Patient lives alone but has daughters who check on her frequently and have noted that the patient has become progressively short of breath over the past week or more and has been progressively fatigued and now somnolent.  She had increased leg swelling and reported a sensation  of fullness around her abdomen, had her diuretic increased 2 days ago and lost 7 pounds since then but continued to have increased shortness of breath and somnolence. For the past month has wanted family to put her in a wheelchair, had not been taking her diuretic consistently due to not wanting to have to go to the restroom, and had been urinating on herself.    Upon arrival to the ED, patient is found to be afebrile, saturating 86% on 6 L/min of supplemental oxygen, and with initial SBP of 215.  EKG features accelerated junctional rhythm. Chest x-ray with worsening diffuse bilateral airspace disease and small bilateral pleural effusions. Patient was placed on BiPAP and treated with IV Lasix, Rocephin, and briefly with nitroglycerin infusion in the ED. The patient was found to have a UTI. She was started on Rocephin. Initial troponins were indeterminately elevated.  Cardiology was consulted as well. Pulmonology was consulted they have recommended continued efforts to diurese the patient and to wean off of BIPAP.   Palliative care has been consulted. The patient has declined MRI to investigate abnormalities of gallbladder. She would have difficulty breathing lying flat for the study.    Ultimately, decision was made for transitioning patient to hospice at home upon discharge.     AKI on chronic kidney disease (CKD) stage G4/A1, severely decreased glomerular filtration rate (GFR) between 15-29 mL/min/1.73 square meter and albuminuria creatinine ratio less than 30 mg/g (HCC) Pt  has AKI on CKD G4A1 with severely decreased filtration rate (15-29 mL/min/1.73 msquared) with albumin/creatinine ration less than 30 mg/g. Creatinine is increased from baseline of 1.80 to 2.40 today -Medical renal disease noted on renal ultrasound   DM type 2 (diabetes mellitus, type 2) ([place]) At home the patient's glucoses have been controlled with Lantus 46 Units qhs, Trulicity 0.27 mg Qwk, Lispro prn meals.  Here since patient  is mostly on the BiPAP, she is not eating much and her blood sugar was controlled only on sliding scale insulin.  I have discontinued Lantus and Trulicity.  Patient to continue sliding scale insulin 3 times a day. - A1c 6% on 08/21/2021   Physical deconditioning Noted. The patient has been evaluated by PT/OT. Their recommendation is for discharge to SNF for rehab. -Patient/family elected for discharging home with hospice.all equipments have been delivered.  Discharging home today   Sleep apnea Pt uses CPAP at home, but has been transitioned to BiPAP with better relief during hospitalization.  BiPAP order has been placed for home delivery prior to discharge   Urinary tract infection, site not specified Urine has grown out klebsiella pneumoniae that is sensitive to Rocephin. The patient has completed her course of antibiotics.   Acute on chronic diastolic CHF (congestive heart failure) (HCC) Echocardiogram from 01/2021 demonstrated an EF of 60-65%. Diastolic function could not be evaluated on that study. Echo from 06/2019 demonstrated an EF of 50-55% with Grade 1 diastolic function.  -Repeat echo shows EF 60 to 65%, moderate LVH, indeterminate diastology -Continue Lasix   Acute on chronic respiratory failure with hypoxia and hypercapnia (HCC) Pt presented in ED with hypoxic and hypercapneic respiratory failure. She was placed on BIPAP.  -Continue BiPAP as needed and oxygen   Elevated troponin Initial troponin 31, then 37, then 69.  - considered demand ischemia    Chronic cholecystitis Renal ultrasound demonstrated cholelithiasis as well as abnormalities of the gallbladder. MRI was suggested by radiology as a means of further invstigating this abnormal appearance. It could be chronic cholecystitis vs malignancy. However, the patient has stated that she is not interested in MRI to look further at this time. Whatever the etiology, the patient appears to be asymptomatic at this time. Earlier  complaints of abdominal pain were resolved after a BM.  Discharge Diagnoses:  Principal Problem:   Acute on chronic respiratory failure with hypoxia and hypercapnia (HCC) Active Problems:   DM type 2 (diabetes mellitus, type 2) (HCC)   Depression, recurrent (HCC)   Sleep apnea   Chronic kidney disease (CKD) stage G4/A1, severely decreased glomerular filtration rate (GFR) between 15-29 mL/min/1.73 square meter and albuminuria creatinine ratio less than 30 mg/g (HCC)   Chronic ITP (idiopathic thrombocytopenia) (HCC)   Acute on chronic diastolic CHF (congestive heart failure) (HCC)   Diarrhea, unspecified   Physical deconditioning   Urinary tract infection, site not specified   Elevated troponin   Pressure injury of skin   Chronic cholecystitis   Acute on chronic congestive heart failure ([place])    Discharge Instructions   Allergies as of 08/27/2021       Reactions   Adhesive [tape] Other (See Comments)   PULLS OFF THE SKIN   Apixaban Other (See Comments)   Internal Bleeding   Aspirin Itching, Rash, Hives, Swelling   Swelling of her tongue   Mirabegron Other (See Comments)   Patient experienced A-Fib   Pineapple Anaphylaxis, Swelling   Throat swells and blisters on tongue and roof of mouth  per patient   Metformin Diarrhea, Nausea Only   Tetracycline Hives   Fluticasone-salmeterol Itching, Rash   Iodinated Diagnostic Agents Itching, Rash      Lactose Intolerance (gi) Other (See Comments)   Gas   Latex Itching, Rash, Other (See Comments)   Pulls off the skin and causes welts   Lisinopril Cough   Metronidazole Other (See Comments)   Reaction not known   Sulfa Antibiotics Rash   Sulfonamide Derivatives Itching, Rash        Medication List     STOP taking these medications    Lantus SoloStar 100 UNIT/ML Solostar Pen Generic drug: insulin glargine   Trulicity 6.81 EX/5.1ZG Sopn Generic drug: Dulaglutide       TAKE these medications    acetaminophen 325 MG  tablet Commonly known as: TYLENOL Take 650 mg by mouth in the morning and at bedtime.   amLODipine 2.5 MG tablet Commonly known as: NORVASC TAKE 3 TABLETS(7.5 MG) BY MOUTH DAILY What changed: See the new instructions.   amoxicillin 500 MG tablet Commonly known as: AMOXIL Take 2,000 mg by mouth as needed. Prior to Dental Appt   atorvastatin 40 MG tablet Commonly known as: LIPITOR TAKE 1 TABLET(40 MG) BY MOUTH AT BEDTIME What changed: See the new instructions.   BD Pen Needle Nano 2nd Gen 32G X 4 MM Misc Generic drug: Insulin Pen Needle USE TO INJECT UP TO FOUR TIMES DAILY AS DIRECTED What changed: See the new instructions.   bisacodyl 5 MG EC tablet Commonly known as: DULCOLAX Take 10 mg by mouth every 3 (three) days as needed (for constipation).   fexofenadine 180 MG tablet Commonly known as: ALLEGRA Take 180 mg by mouth daily.   FreeStyle Libre 14 Day Sensor Misc APPLY NEW SENSOR EVERY 14 DAYS AS DIRECTED What changed:  how much to take how to take this when to take this   hyoscyamine 0.125 MG tablet Commonly known as: LEVSIN Take 1 tablet by mouth daily.   insulin lispro 100 UNIT/ML KwikPen Commonly known as: HumaLOG KwikPen INJECT 5 TO 10 UNITS UNDER THE SKIN THREE TIMES DAILY WITH BREAKFAST, LUNCH AND EVENING MEAL if she is eating and based on how much she is eating. What changed: additional instructions   INSULIN SYRINGE .3CC/29GX1/2" 29G X 1/2" 0.3 ML Misc Use to inject insulin 4 times a day. What changed:  how much to take how to take this when to take this   levothyroxine 112 MCG tablet Commonly known as: SYNTHROID TAKE 1 TABLET(112 MCG) BY MOUTH DAILY What changed: See the new instructions.   multivitamin tablet Take 1 tablet by mouth daily.   potassium citrate 10 MEQ (1080 MG) SR tablet Commonly known as: UROCIT-K Take 20 mEq by mouth 2 (two) times daily.   sertraline 50 MG tablet Commonly known as: ZOLOFT TAKE 1 TABLET BY MOUTH AT  BEDTIME What changed:  how much to take how to take this when to take this additional instructions   Torsemide 40 MG Tabs Take 40 mg by mouth 2 (two) times daily. What changed:  medication strength how much to take when to take this   vitamin B-12 100 MCG tablet Commonly known as: CYANOCOBALAMIN Take 100 mcg by mouth daily.   vitamin C 250 MG tablet Commonly known as: ASCORBIC ACID Take 250 mg by mouth daily.   Vitamin D3 50 MCG (2000 UT) Tabs Take 5,000 Units by mouth daily.   Xeroform Petrolat Patch 4"x4" Pads Apply 1 Units topically  daily as needed.               Durable Medical Equipment  (From admission, onward)           Start     Ordered   08/26/21 1015  For home use only DME Bipap  Once       Comments: Rate 14, 35% FiO2  Question Answer Comment  Length of Need Lifetime   Inspiratory pressure 20   Expiratory pressure 8      08/26/21 1014            Follow-up Information     Koberlein, Junell C, MD Follow up in 1 week(s).   Specialty: Family Medicine Contact information: [address] [place] [address] [phone]         Buford Dresser, MD .   Specialty: Cardiology Contact information: [address] [place] [address] [phone]         Thompson Grayer, MD .   Specialty: Cardiology Contact information: [place] [address] [phone]                Allergies  Allergen Reactions   Adhesive [Tape] Other (See Comments)    PULLS OFF THE SKIN   Apixaban Other (See Comments)    Internal Bleeding   Aspirin Itching, Rash, Hives and Swelling    Swelling of her tongue   Mirabegron Other (See Comments)    Patient experienced A-Fib   Pineapple Anaphylaxis and Swelling    Throat swells and blisters on tongue and roof of mouth per patient   Metformin Diarrhea and Nausea Only   Tetracycline Hives   Fluticasone-Salmeterol Itching and Rash   Iodinated  Diagnostic Agents Itching and Rash        Lactose Intolerance (Gi) Other (See Comments)    Gas    Latex Itching, Rash and Other (See Comments)    Pulls off the skin and causes welts   Lisinopril Cough   Metronidazole Other (See Comments)    Reaction not known   Sulfa Antibiotics Rash   Sulfonamide Derivatives Itching and Rash    Consultations: Cardiology and pulmonology   Procedures/Studies: DG Chest 2 View  Result Date: 08/20/2021 CLINICAL DATA:  Pulmonary edema, follow-up EXAM: CHEST - 2 VIEW COMPARISON:  08/18/2021 FINDINGS: Bilateral airspace disease again noted, similar to a prior study. Small bilateral pleural effusions. Mild cardiomegaly. Aortic atherosclerosis. No acute bony abnormality. IMPRESSION: Cardiomegaly with bilateral airspace disease which could reflect edema or infection. Small bilateral effusions. Findings similar to prior study. Electronically Signed   By: Rolm Baptise M.D.   On: 08/20/2021 18:34   DG Chest 2 View  Result Date: 08/18/2021 CLINICAL DATA:  Shortness of breath. EXAM: CHEST - 2 VIEW COMPARISON:  X-ray chest 05/05/2021. FINDINGS: Stable cardiomediastinal silhouette with mild cardiomegaly. No pneumothorax. Small bilateral pleural effusions, right greater than left, similar. New patchy hazy parahilar opacities throughout both lungs with chronic bibasilar scarring versus atelectasis. IMPRESSION: 1. Mild cardiomegaly with new patchy hazy parahilar opacities throughout both lungs, favor pulmonary edema, with multilobar pneumonia not excluded. 2. Small bilateral pleural effusions, right greater than left, similar to prior radiograph. These results will be called to the ordering clinician or representative by the Radiologist Assistant, and communication documented in the PACS or [employer]. Electronically Signed   By: Ilona Sorrel M.D.   On: 08/18/2021 14:13   US RENAL  Result Date: 08/21/2021 CLINICAL DATA:  Acute  kidney injury. EXAM: RENAL / URINARY  TRACT ULTRASOUND COMPLETE COMPARISON:  CT abdomen and pelvis 09/28/2019. Right upper quadrant abdominal ultrasound 09/28/2019. Renal ultrasound 07/29/2019. FINDINGS: Right Kidney: Renal measurements: 11.0 x 5.7 x 5.8 cm = volume: 188 mL. Renal cortical thinning and mildly increased parenchymal echogenicity. No mass or hydronephrosis. Left Kidney: Renal measurements: 10.6 x 6.2 x 6.6 cm = volume: 226 mL. Overall poor visualization of the left kidney due to body habitus. Renal cortical thinning and mildly increased parenchymal echogenicity. 2.5 cm cyst. No hydronephrosis. Bladder: Decompressed by Foley catheter. Other: Stones in the gallbladder measuring up to 1.5 cm. Additional complex material filling the gallbladder containing multiple septations, new from 09/28/2019. IMPRESSION: 1. Bilateral renal cortical thinning and increased parenchymal echogenicity compatible with medical renal disease. No hydronephrosis. 2. Cholelithiasis. 3. New complex material in the gallbladder with multiple septations, indeterminate. Considerations include complex sludge, membranous cholecystitis, sequelae of chronic inflammation, hemorrhage, and cystic tumor. Correlate for clinical signs of acute cholecystitis and consider abdominal MRI without and with contrast for further evaluation. Electronically Signed   By: Logan Bores M.D.   On: 08/21/2021 17:11   DG CHEST PORT 1 VIEW  Result Date: 08/25/2021 CLINICAL DATA:  Pleural effusion EXAM: PORTABLE CHEST 1 VIEW COMPARISON:  Chest x-ray 08/23/2021 FINDINGS: Cardiomegaly and mediastinum appear unchanged. Calcified plaques in the aortic arch. Mild central pulmonary vascular prominence with overall improved aeration of the lungs. Hazy opacities on the right likely represent small to moderate pleural effusion with associated atelectasis/infiltrate. Possible trace left pleural effusion and basilar atelectasis. No pneumothorax. IMPRESSION: Improving aeration of the lungs since previous  study. Persistent pulmonary vascular congestion and pleural effusions right greater than left. Electronically Signed   By: Ofilia Neas M.D.   On: 08/25/2021 08:25   DG CHEST PORT 1 VIEW  Result Date: 08/23/2021 CLINICAL DATA:  Short of breath. EXAM: PORTABLE CHEST 1 VIEW COMPARISON:  08/22/2021 and older exams. FINDINGS: Right greater than left patchy airspace lung opacities, and more confluent bilateral lung base opacities, are unchanged. No new lung abnormalities. No pneumothorax. IMPRESSION: 1. No significant interval change in the bilateral airspace lung opacities. The more confluent opacities at the lung bases are consistent with a combination of the lung opacities and bilateral pleural effusions. Lung findings may reflect bilateral pneumonia or pulmonary edema, the latter favored. Electronically Signed   By: Lajean Manes M.D.   On: 08/23/2021 09:14   DG CHEST PORT 1 VIEW  Result Date: 08/22/2021 CLINICAL DATA:  Hypoxia. EXAM: PORTABLE CHEST 1 VIEW COMPARISON:  08/20/2021 FINDINGS: Persistent bilateral airspace disease, right side slightly greater than left. Cardiac silhouette remains prominent for size but obscured by the lung densities. Basilar chest densities could represent bilateral pleural effusions, right side greater than left. IMPRESSION: 1. No significant change in the bilateral airspace disease. Differential diagnosis includes pneumonia and/or pulmonary edema. 2. Probable right pleural effusion. Electronically Signed   By: Markus Daft M.D.   On: 08/22/2021 13:03   DG Chest Portable 1 View  Result Date: 08/20/2021 CLINICAL DATA:  Show shortness of breath EXAM: PORTABLE CHEST 1 VIEW COMPARISON:  08/20/2021 FINDINGS: Diffuse bilateral airspace disease, worsening since prior study. Cardiomegaly. Suspect small bilateral effusions. Aortic atherosclerosis. No acute bony abnormality. IMPRESSION: Worsening diffuse bilateral airspace disease and small bilateral effusions. Findings  concerning for edema/CHF. Pneumonia not excluded. Electronically Signed   By: Rolm Baptise M.D.   On: 08/20/2021 18:33   ECHOCARDIOGRAM LIMITED  Result Date: 08/22/2021    ECHOCARDIOGRAM  LIMITED REPORT   Patient Name:   MAISIE HAUSER [facility] Date of Exam: 08/22/2021 Medical Rec #:  [id number]             Height:       61.0 in Accession #:    [id number]            Weight:       217.4 lb Date of Birth:  1934-09-13            BSA:          1.957 m Patient Age:    86 years              BP:           150/51 mmHg Patient Gender: F                     HR:           53 bpm. Exam Location:  Inpatient Procedure: Limited Echo, Limited Color Doppler and Cardiac Doppler Indications:    Congestive Heart Failure I50.9  History:        Patient has prior history of Echocardiogram examinations, most                 recent 01/31/2021. Risk Factors:Hypertension, Diabetes and                 Dyslipidemia. Respiratory failure with hypoxia.  Sonographer:    Darlina Sicilian RDCS Referring Phys: [phone] HAO MENG  Sonographer Comments: Image acquisition challenging due to respiratory motion, patient on BiPap IMPRESSIONS  1. Left ventricular ejection fraction, by estimation, is 60 to 65%. The left ventricle has normal function. The left ventricle has no regional wall motion abnormalities. There is moderate left ventricular hypertrophy. Left ventricular diastolic parameters are indeterminate.  2. Right ventricular systolic function is normal. The right ventricular size is mildly enlarged. Tricuspid regurgitation signal is inadequate for assessing PA pressure.  3. The mitral valve is grossly normal. No evidence of mitral valve regurgitation.  4. The inferior vena cava is dilated in size with >50% respiratory variability, suggesting right atrial pressure of 8 mmHg. Conclusion(s)/Recommendation(s): No significant changes from prior echo 01/31/2021. FINDINGS  Left Ventricle: Left ventricular ejection fraction, by estimation, is 60 to 65%. The  left ventricle has normal function. The left ventricle has no regional wall motion abnormalities. The left ventricular internal cavity size was small. There is moderate  left ventricular hypertrophy. Left ventricular diastolic parameters are indeterminate. Right Ventricle: The right ventricular size is mildly enlarged. No increase in right ventricular wall thickness. Right ventricular systolic function is normal. Tricuspid regurgitation signal is inadequate for assessing PA pressure. Mitral Valve: The mitral valve is grossly normal. There is mild calcification of the mitral valve leaflet(s). Pulmonic Valve: The pulmonic valve was not well visualized. Venous: The inferior vena cava is dilated in size with greater than 50% respiratory variability, suggesting right atrial pressure of 8 mmHg. LEFT VENTRICLE PLAX 2D LVIDd:         4.40 cm   Diastology LVIDs:         2.20 cm   LV e' medial:    3.99 cm/s LV PW:         1.30 cm   LV E/e' medial:  34.3 LV IVS:        1.40 cm   LV e' lateral:   5.15 cm/s LVOT diam:     2.00 cm   LV E/e' lateral: 26.6  LV SV:         68 LV SV Index:   35 LVOT Area:     3.14 cm  LEFT ATRIUM         Index LA diam:    4.00 cm 2.04 cm/m  AORTIC VALVE LVOT Vmax:   82.80 cm/s LVOT Vmean:  49.000 cm/s LVOT VTI:    0.218 m MITRAL VALVE MV Area (PHT): 3.51 cm     SHUNTS MV Decel Time: 216 msec     Systemic VTI:  0.22 m MV E velocity: 137.00 cm/s  Systemic Diam: 2.00 cm MV A velocity: 36.50 cm/s MV E/A ratio:  3.75 Phineas Inches Electronically signed by Phineas Inches Signature Date/Time: 08/22/2021/2:04:12 PM    Final      Discharge Exam: Vitals:   08/27/21 0741 08/27/21 0806  BP: (!) 155/39   Pulse: 64 62  Resp: 20 (!) 31  Temp:    SpO2: 93% 93%   Vitals:   08/27/21 0350 08/27/21 0400 08/27/21 0741 08/27/21 0806  BP:  (!) 134/34 (!) 155/39   Pulse: (!) 57 61 64 62  Resp: 18 18 20  (!) 31  Temp:  98.7 F (37.1 C)    TempSrc:  Oral Oral   SpO2: 95% 94% 93% 93%  Weight:  95.5 kg     Height:        General: Pt is lethargic on BiPAP. Cardiovascular: RRR, S1/S2 +, no rubs, no gallops Respiratory: CTA bilaterally, no wheezing, no rhonchi Abdominal: Soft, NT, ND, bowel sounds +     The results of significant diagnostics from this hospitalization (including imaging, microbiology, ancillary and laboratory) are listed below for reference.     Microbiology: Recent Results (from the past 240 hour(s))  Urine Culture     Status: Abnormal   Collection Time: 08/20/21 12:10 PM   Specimen: Urine  Result Value Ref Range Status   MICRO NUMBER: [id number]  Final   SPECIMEN QUALITY: Adequate  Final   Sample Source NOT GIVEN  Final   STATUS: FINAL  Final   ISOLATE 1: Klebsiella pneumoniae (A)  Final    Comment: Greater than 100,000 CFU/mL of Klebsiella pneumoniae      Susceptibility   Klebsiella pneumoniae - URINE CULTURE, REFLEX    AMOX/CLAVULANIC <=2 Sensitive     AMPICILLIN >=32 Resistant     AMPICILLIN/SULBACTAM 8 Sensitive     CEFAZOLIN* <=4 Not Reportable      * For infections other than uncomplicated UTI caused by E. coli, K. pneumoniae or P. mirabilis: Cefazolin is resistant if MIC > or = 8 mcg/mL. (Distinguishing susceptible versus intermediate for isolates with MIC < or = 4 mcg/mL requires additional testing.) For uncomplicated UTI caused by E. coli, K. pneumoniae or P. mirabilis: Cefazolin is susceptible if MIC <32 mcg/mL and predicts susceptible to the oral agents cefaclor, cefdinir, cefpodoxime, cefprozil, cefuroxime, cephalexin and loracarbef.     CEFTAZIDIME <=1 Sensitive     CEFEPIME <=1 Sensitive     CEFTRIAXONE <=1 Sensitive     CIPROFLOXACIN <=0.25 Sensitive     LEVOFLOXACIN <=0.12 Sensitive     GENTAMICIN <=1 Sensitive     IMIPENEM <=0.25 Sensitive     NITROFURANTOIN 64 Intermediate     PIP/TAZO <=4 Sensitive     TOBRAMYCIN <=1 Sensitive     TRIMETH/SULFA* <=20 Sensitive      * For infections other than uncomplicated UTI caused by E. coli,  K. pneumoniae or P. mirabilis: Cefazolin is resistant if  MIC > or = 8 mcg/mL. (Distinguishing susceptible versus intermediate for isolates with MIC < or = 4 mcg/mL requires additional testing.) For uncomplicated UTI caused by E. coli, K. pneumoniae or P. mirabilis: Cefazolin is susceptible if MIC <32 mcg/mL and predicts susceptible to the oral agents cefaclor, cefdinir, cefpodoxime, cefprozil, cefuroxime, cephalexin and loracarbef. Legend: S = Susceptible  I = Intermediate R = Resistant  NS = Not susceptible * = Not tested  NR = Not reported **NN = See antimicrobic comments   Resp Panel by RT-PCR (Flu A&B, Covid) Nasopharyngeal Swab     Status: None   Collection Time: 08/20/21  8:05 PM   Specimen: Nasopharyngeal Swab; Nasopharyngeal(NP) swabs in vial transport medium  Result Value Ref Range Status   SARS Coronavirus 2 by RT PCR NEGATIVE NEGATIVE Final    Comment: (NOTE) SARS-CoV-2 target nucleic acids are NOT DETECTED.  The SARS-CoV-2 RNA is generally detectable in upper respiratory specimens during the acute phase of infection. The lowest concentration of SARS-CoV-2 viral copies this assay can detect is 138 copies/mL. A negative result does not preclude SARS-Cov-2 infection and should not be used as the sole basis for treatment or other patient management decisions. A negative result may occur with  improper specimen collection/handling, submission of specimen other than nasopharyngeal swab, presence of viral mutation(s) within the areas targeted by this assay, and inadequate number of viral copies(<138 copies/mL). A negative result must be combined with clinical observations, patient history, and epidemiological information. The expected result is Negative.  Fact Sheet for Patients:  EntrepreneurPulse.com.au  Fact Sheet for Healthcare Providers:  IncredibleEmployment.be  This test is no t yet approved or cleared by the [place] FDA  and  has been authorized for detection and/or diagnosis of SARS-CoV-2 by FDA under an Emergency Use Authorization (EUA). This EUA will remain  in effect (meaning this test can be used) for the duration of the COVID-19 declaration under Section 564(b)(1) of the Act, 21 U.S.C.section 360bbb-3(b)(1), unless the authorization is terminated  or revoked sooner.       Influenza A by PCR NEGATIVE NEGATIVE Final   Influenza B by PCR NEGATIVE NEGATIVE Final    Comment: (NOTE) The Xpert Xpress SARS-CoV-2/FLU/RSV plus assay is intended as an aid in the diagnosis of influenza from Nasopharyngeal swab specimens and should not be used as a sole basis for treatment. Nasal washings and aspirates are unacceptable for Xpert Xpress SARS-CoV-2/FLU/RSV testing.  Fact Sheet for Patients: EntrepreneurPulse.com.au  Fact Sheet for Healthcare Providers: IncredibleEmployment.be  This test is not yet approved or cleared by the [place] FDA and has been authorized for detection and/or diagnosis of SARS-CoV-2 by FDA under an Emergency Use Authorization (EUA). This EUA will remain in effect (meaning this test can be used) for the duration of the COVID-19 declaration under Section 564(b)(1) of the Act, 21 U.S.C. section 360bbb-3(b)(1), unless the authorization is terminated or revoked.  Performed at [facility], [place] [address], [place], [place] [address]   Urine Culture     Status: Abnormal   Collection Time: 08/20/21  8:19 PM   Specimen: Urine, Catheterized  Result Value Ref Range Status   Specimen Description URINE, CATHETERIZED  Final   Special Requests   Final    NONE Performed at [facility], [place] [address], [place], [place] [address]    Culture >=100,000 COLONIES/mL KLEBSIELLA PNEUMONIAE (A)  Final   Report Status 08/22/2021 FINAL  Final   Organism ID, Bacteria KLEBSIELLA PNEUMONIAE (A)  Final      Susceptibility   Klebsiella pneumoniae -  MIC*    AMPICILLIN >=32 RESISTANT Resistant     CEFAZOLIN <=4 SENSITIVE Sensitive     CEFEPIME <=0.12 SENSITIVE Sensitive     CEFTRIAXONE <=0.25 SENSITIVE Sensitive     CIPROFLOXACIN <=0.25 SENSITIVE Sensitive     GENTAMICIN <=1 SENSITIVE Sensitive     IMIPENEM <=0.25 SENSITIVE Sensitive     NITROFURANTOIN 64 INTERMEDIATE Intermediate     TRIMETH/SULFA <=20 SENSITIVE Sensitive     AMPICILLIN/SULBACTAM 4 SENSITIVE Sensitive     PIP/TAZO <=4 SENSITIVE Sensitive     * >=100,000 COLONIES/mL KLEBSIELLA PNEUMONIAE     Labs: BNP (last 3 results) Recent Labs    03/14/21 1606 08/20/21 2005 08/24/21 0439  BNP 111.6* 237.9* 381.0*   Basic Metabolic Panel: Recent Labs  Lab 08/21/21 0348 08/21/21 1500 08/22/21 0148 08/23/21 0222 08/24/21 0439 08/25/21 0141 08/26/21 0156  NA 143   < > 144 145 143 140 140  K 4.2   < > 3.9 3.6 3.3* 3.1* 3.8  CL 101  --  102 100 99 97* 99  CO2 33*  --  32 35* 34* 33* 33*  GLUCOSE 105*  --  142* 157* 156* 155* 165*  BUN 74*  --  81* 86* 96* 103* 105*  CREATININE 2.73*  --  2.82* 3.09* 2.71* 2.68* 2.40*  CALCIUM 8.9  --  9.0 9.0 8.9 8.8* 8.9  MG 2.1  --   --   --   --   --   --    < > = values in this interval not displayed.   Liver Function Tests: Recent Labs  Lab 08/20/21 1142 08/20/21 1802  AST 10 16  ALT 9 16  ALKPHOS 76 83  BILITOT 0.7 1.0  PROT 7.3 7.5  ALBUMIN 3.8 3.6   No results for input(s): LIPASE, AMYLASE in the last 168 hours. No results for input(s): AMMONIA in the last 168 hours. CBC: Recent Labs  Lab 08/20/21 1142 08/20/21 1802 08/20/21 1911 08/21/21 0348 08/21/21 1500 08/22/21 0148 08/23/21 0222 08/24/21 0439 08/25/21 0141  WBC 6.1 10.2  --  6.4  --  9.1 8.5 7.0 7.6  NEUTROABS 4.9 8.6*  --   --   --   --   --  5.8 6.2  HGB 10.0* 10.7*   < > 9.2* 9.5* 8.7* 8.5* 8.3* 7.8*  HCT 29.8* 35.6*   < > 29.6* 28.0* 27.4* 26.7* 26.2* 25.3*  MCV 93.2 102.9*  --  99.7  --  97.9 99.6 98.9 98.1  PLT 143.0* 163  --  128*  --   128* 133* 134* 134*   < > = values in this interval not displayed.   Cardiac Enzymes: No results for input(s): CKTOTAL, CKMB, CKMBINDEX, TROPONINI in the last 168 hours. BNP: Invalid input(s): POCBNP CBG: Recent Labs  Lab 08/26/21 0609 08/26/21 1100 08/26/21 1538 08/26/21 2245 08/27/21 0555  GLUCAP 171* 174* 212* 172* 176*   D-Dimer No results for input(s): DDIMER in the last 72 hours. Hgb A1c No results for input(s): HGBA1C in the last 72 hours. Lipid Profile No results for input(s): CHOL, HDL, LDLCALC, TRIG, CHOLHDL, LDLDIRECT in the last 72 hours. Thyroid function studies No results for input(s): TSH, T4TOTAL, T3FREE, THYROIDAB in the last 72 hours.  Invalid input(s): FREET3 Anemia work up No results for input(s): VITAMINB12, FOLATE, FERRITIN, TIBC, IRON, RETICCTPCT in the last 72 hours. Urinalysis    Component Value Date/Time  COLORURINE YELLOW 08/20/2021 1802   APPEARANCEUR HAZY (A) 08/20/2021 1802   LABSPEC 1.025 08/20/2021 1802   PHURINE 5.5 08/20/2021 1802   GLUCOSEU NEGATIVE 08/20/2021 1802   GLUCOSEU NEGATIVE 08/20/2021 1207   HGBUR TRACE (A) 08/20/2021 1802   HGBUR negative 08/05/2010 1523   BILIRUBINUR NEGATIVE 08/20/2021 1802   BILIRUBINUR N 12/15/2016 1059   KETONESUR NEGATIVE 08/20/2021 1802   PROTEINUR 100 (A) 08/20/2021 1802   UROBILINOGEN 0.2 08/20/2021 1207   NITRITE NEGATIVE 08/20/2021 1802   LEUKOCYTESUR SMALL (A) 08/20/2021 1802   Sepsis Labs Invalid input(s): PROCALCITONIN,  WBC,  LACTICIDVEN Microbiology Recent Results (from the past 240 hour(s))  Urine Culture     Status: Abnormal   Collection Time: 08/20/21 12:10 PM   Specimen: Urine  Result Value Ref Range Status   MICRO NUMBER: [id number]  Final   SPECIMEN QUALITY: Adequate  Final   Sample Source NOT GIVEN  Final   STATUS: FINAL  Final   ISOLATE 1: Klebsiella pneumoniae (A)  Final    Comment: Greater than 100,000 CFU/mL of Klebsiella pneumoniae      Susceptibility   Klebsiella  pneumoniae - URINE CULTURE, REFLEX    AMOX/CLAVULANIC <=2 Sensitive     AMPICILLIN >=32 Resistant     AMPICILLIN/SULBACTAM 8 Sensitive     CEFAZOLIN* <=4 Not Reportable      * For infections other than uncomplicated UTI caused by E. coli, K. pneumoniae or P. mirabilis: Cefazolin is resistant if MIC > or = 8 mcg/mL. (Distinguishing susceptible versus intermediate for isolates with MIC < or = 4 mcg/mL requires additional testing.) For uncomplicated UTI caused by E. coli, K. pneumoniae or P. mirabilis: Cefazolin is susceptible if MIC <32 mcg/mL and predicts susceptible to the oral agents cefaclor, cefdinir, cefpodoxime, cefprozil, cefuroxime, cephalexin and loracarbef.     CEFTAZIDIME <=1 Sensitive     CEFEPIME <=1 Sensitive     CEFTRIAXONE <=1 Sensitive     CIPROFLOXACIN <=0.25 Sensitive     LEVOFLOXACIN <=0.12 Sensitive     GENTAMICIN <=1 Sensitive     IMIPENEM <=0.25 Sensitive     NITROFURANTOIN 64 Intermediate     PIP/TAZO <=4 Sensitive     TOBRAMYCIN <=1 Sensitive     TRIMETH/SULFA* <=20 Sensitive      * For infections other than uncomplicated UTI caused by E. coli, K. pneumoniae or P. mirabilis: Cefazolin is resistant if MIC > or = 8 mcg/mL. (Distinguishing susceptible versus intermediate for isolates with MIC < or = 4 mcg/mL requires additional testing.) For uncomplicated UTI caused by E. coli, K. pneumoniae or P. mirabilis: Cefazolin is susceptible if MIC <32 mcg/mL and predicts susceptible to the oral agents cefaclor, cefdinir, cefpodoxime, cefprozil, cefuroxime, cephalexin and loracarbef. Legend: S = Susceptible  I = Intermediate R = Resistant  NS = Not susceptible * = Not tested  NR = Not reported **NN = See antimicrobic comments   Resp Panel by RT-PCR (Flu A&B, Covid) Nasopharyngeal Swab     Status: None   Collection Time: 08/20/21  8:05 PM   Specimen: Nasopharyngeal Swab; Nasopharyngeal(NP) swabs in vial transport medium  Result Value Ref Range Status    SARS Coronavirus 2 by RT PCR NEGATIVE NEGATIVE Final    Comment: (NOTE) SARS-CoV-2 target nucleic acids are NOT DETECTED.  The SARS-CoV-2 RNA is generally detectable in upper respiratory specimens during the acute phase of infection. The lowest concentration of SARS-CoV-2 viral copies this assay can detect is 138 copies/mL. A negative result does not preclude  SARS-Cov-2 infection and should not be used as the sole basis for treatment or other patient management decisions. A negative result may occur with  improper specimen collection/handling, submission of specimen other than nasopharyngeal swab, presence of viral mutation(s) within the areas targeted by this assay, and inadequate number of viral copies(<138 copies/mL). A negative result must be combined with clinical observations, patient history, and epidemiological information. The expected result is Negative.  Fact Sheet for Patients:  EntrepreneurPulse.com.au  Fact Sheet for Healthcare Providers:  IncredibleEmployment.be  This test is no t yet approved or cleared by the [place] FDA and  has been authorized for detection and/or diagnosis of SARS-CoV-2 by FDA under an Emergency Use Authorization (EUA). This EUA will remain  in effect (meaning this test can be used) for the duration of the COVID-19 declaration under Section 564(b)(1) of the Act, 21 U.S.C.section 360bbb-3(b)(1), unless the authorization is terminated  or revoked sooner.       Influenza A by PCR NEGATIVE NEGATIVE Final   Influenza B by PCR NEGATIVE NEGATIVE Final    Comment: (NOTE) The Xpert Xpress SARS-CoV-2/FLU/RSV plus assay is intended as an aid in the diagnosis of influenza from Nasopharyngeal swab specimens and should not be used as a sole basis for treatment. Nasal washings and aspirates are unacceptable for Xpert Xpress SARS-CoV-2/FLU/RSV testing.  Fact Sheet for  Patients: EntrepreneurPulse.com.au  Fact Sheet for Healthcare Providers: IncredibleEmployment.be  This test is not yet approved or cleared by the [place] FDA and has been authorized for detection and/or diagnosis of SARS-CoV-2 by FDA under an Emergency Use Authorization (EUA). This EUA will remain in effect (meaning this test can be used) for the duration of the COVID-19 declaration under Section 564(b)(1) of the Act, 21 U.S.C. section 360bbb-3(b)(1), unless the authorization is terminated or revoked.  Performed at [facility], [place] [address], [place], [place] [address]   Urine Culture     Status: Abnormal   Collection Time: 08/20/21  8:19 PM   Specimen: Urine, Catheterized  Result Value Ref Range Status   Specimen Description URINE, CATHETERIZED  Final   Special Requests   Final    NONE Performed at [facility], [place] [address] [place],  [address]    Culture >=100,000 COLONIES/mL KLEBSIELLA PNEUMONIAE (A)  Final   Report Status 08/22/2021 FINAL  Final   Organism ID, Bacteria KLEBSIELLA PNEUMONIAE (A)  Final      Susceptibility   Klebsiella pneumoniae - MIC*    AMPICILLIN >=32 RESISTANT Resistant     CEFAZOLIN <=4 SENSITIVE Sensitive     CEFEPIME <=0.12 SENSITIVE Sensitive     CEFTRIAXONE <=0.25 SENSITIVE Sensitive     CIPROFLOXACIN <=0.25 SENSITIVE Sensitive     GENTAMICIN <=1 SENSITIVE Sensitive     IMIPENEM <=0.25 SENSITIVE Sensitive     NITROFURANTOIN 64 INTERMEDIATE Intermediate     TRIMETH/SULFA <=20 SENSITIVE Sensitive     AMPICILLIN/SULBACTAM 4 SENSITIVE Sensitive     PIP/TAZO <=4 SENSITIVE Sensitive     * >=100,000 COLONIES/mL KLEBSIELLA PNEUMONIAE     Time coordinating discharge: Over 30 minutes  SIGNED:   Darliss Cheney, MD  [facility] 08/27/2021, 10:27 AM  If 7PM-7AM, please contact night-coverage [email or web address]

## 2021-08-27 NOTE — TOC Transition Note (Signed)
Transition of Care [facility]) - CM/SW Discharge Note   Patient Details  Name: [name] MRN: [id number] Date of Birth: [birth year]  [facility] CM/SW Contact:  Zenon Mayo, RN Phone Number: 08/27/2021, 1:02 PM   Clinical Narrative:    Patient is for dc today home with hospice with [facility],  [facility] has been called.  Daughter would like a couple days of morphine until she can be switched over to hospice, MD notified of this information.  Bipap will be delivered to the paitent home by daughter, she has been instructed on how to hook patient up to the bipap.  She will be transported by ptar on 7-8 liters of oxygen until she gets home.      Barriers to Discharge: Continued Medical Work up   Patient Goals and CMS Choice Patient states their goals for this hospitalization and ongoing recovery are:: home with hospice care and caregiver support CMS Medicare.gov Compare Post Acute Care list provided to:: Patient Choice offered to / list presented to : Patient  Discharge Placement                       Discharge Plan and Services In-house Referral: Clinical Social Work Discharge Planning Services: CM Consult Post Acute Care Choice: Hospice                               Social Determinants of Health (SDOH) Interventions     Readmission Risk Interventions Readmission Risk Prevention Plan 07/20/2019  Transportation Screening Complete  PCP or Specialist Appt within 3-5 Days Not Complete  Not Complete comments unknown DC date  [place] or [place] Complete  Social Work Consult for [place] Planning/Counseling Not Complete  SW consult not completed comments following for needs  Palliative Care Screening Not Complete  Medication Review (RN Transport planner) Referral to Pharmacy  Some recent data might be hidden

## 2021-08-28 ENCOUNTER — Encounter: Payer: Medicare Other | Admitting: Physical Therapy

## 2021-08-29 ENCOUNTER — Other Ambulatory Visit: Payer: Self-pay | Admitting: Family Medicine

## 2021-08-29 NOTE — Telephone Encounter (Signed)
I'm so sorry; this request came when I was not in the office. I see that they were able to fill (according to the controlled substance database) on 12/21. If they are in need of refill (depending frequency of use) then let me know so I can help from this standpoint. I believe they had consulted hospice care? So frequently they will manage morphine rx since they have someone working around the clock/supply access.

## 2021-08-29 NOTE — Telephone Encounter (Signed)
Spoke with [name] and informed her of the message below.

## 2021-09-02 ENCOUNTER — Encounter: Payer: Medicare Other | Admitting: Physical Therapy

## 2021-09-02 ENCOUNTER — Telehealth: Payer: Self-pay | Admitting: Family Medicine

## 2021-09-02 NOTE — Telephone Encounter (Addendum)
Sara Schwartz RN [facility] is calling and pt needs new rx from dr Ethlyn Gallery hyoscyamine (LEVSIN) 0.125 MG tablet .   Pt is taking medication one pill at night . This medication was originally  prescribed by dr Marlou Porch [facility] [phone] - [place], [address] AT [address] [place] Phone:  [phone]  Fax:  [phone]

## 2021-09-03 ENCOUNTER — Other Ambulatory Visit: Payer: Self-pay | Admitting: Family Medicine

## 2021-09-03 MED ORDER — HYOSCYAMINE SULFATE 0.125 MG PO TABS: 0.1250 mg | ORAL_TABLET | Freq: Every day | ORAL | 1 refills | Status: AC

## 2021-09-05 ENCOUNTER — Other Ambulatory Visit: Payer: Self-pay | Admitting: Internal Medicine

## 2021-09-05 DIAGNOSIS — E1122 Type 2 diabetes mellitus with diabetic chronic kidney disease: Secondary | ICD-10-CM

## 2021-09-09 ENCOUNTER — Telehealth: Payer: Self-pay | Admitting: *Deleted

## 2021-09-09 NOTE — Telephone Encounter (Signed)
This nurse called Meredeth, daughter, to inquire if patient needed a refill. Meredeth stated,"No. Mom has been on Hospice and we have enough nausea medicine." I told her I would inform Dr. Marin Olp and the staff because we didn't know about Hospice. I canceled the January appointments due to patient being bed ridden. She verbalized understanding. Patient is using [facility].

## 2021-09-10 ENCOUNTER — Encounter: Payer: Self-pay | Admitting: Family Medicine

## 2021-09-10 ENCOUNTER — Telehealth (INDEPENDENT_AMBULATORY_CARE_PROVIDER_SITE_OTHER): Payer: Medicare Other | Admitting: Family Medicine

## 2021-09-10 DIAGNOSIS — Z515 Encounter for palliative care: Secondary | ICD-10-CM | POA: Diagnosis not present

## 2021-09-10 DIAGNOSIS — I10 Essential (primary) hypertension: Secondary | ICD-10-CM

## 2021-09-10 DIAGNOSIS — J9611 Chronic respiratory failure with hypoxia: Secondary | ICD-10-CM

## 2021-09-10 DIAGNOSIS — E039 Hypothyroidism, unspecified: Secondary | ICD-10-CM | POA: Diagnosis not present

## 2021-09-10 DIAGNOSIS — Z794 Long term (current) use of insulin: Secondary | ICD-10-CM

## 2021-09-10 DIAGNOSIS — N184 Chronic kidney disease, stage 4 (severe): Secondary | ICD-10-CM | POA: Diagnosis not present

## 2021-09-10 DIAGNOSIS — I480 Paroxysmal atrial fibrillation: Secondary | ICD-10-CM

## 2021-09-10 DIAGNOSIS — R1011 Right upper quadrant pain: Secondary | ICD-10-CM

## 2021-09-10 DIAGNOSIS — E1122 Type 2 diabetes mellitus with diabetic chronic kidney disease: Secondary | ICD-10-CM | POA: Diagnosis not present

## 2021-09-10 MED ORDER — GLUCAGON EMERGENCY 1 MG/ML IJ SOLR: INTRAMUSCULAR | 2 refills | Status: AC

## 2021-09-10 NOTE — Progress Notes (Signed)
Virtual Visit via Video Note  I connected with [name] on 09/10/21 at  2:30 PM EST by a video enabled telemedicine application and verified that I am speaking with the correct person using two identifiers.  Location patient: home Location provider: [place], [place] [address] Persons participating in the virtual visit: patient, provider  I discussed the limitations of evaluation and management by telemedicine and the availability of in person appointments. The patient expressed understanding and agreed to proceed.   [name] DOB: [birth year] Encounter date: 09/10/2021  This is a 87 y.o. female who presents with Chief Complaint  Patient presents with   Follow-up    Patient's daughter states the patient is refusing to take any of her medications at this time    History of present illness: Patient was sent to ER after last visit with me after finding of elevated troponin. She was admitted 08/20/21-08/27/21 with shortness of breath, somnolence, hypoxia, UTI, bilat pleural effusions. Placed on bipap. She was transitioned to hospice before discharge and discharged home with hospice care.  Patient is in bed at present and daughters are with her on this phone call.  Had morphine around 12. So she is a little sleepy.   She decided she was tired of fighting, evaluation, etc and decided to do home hospice.has stopped eating; just once a day.not wanting to take medication. Only thing she is really taking is insulin and morphine. Doesn't want to swallow pills.   She got gout and daughter felt like the prednisone she was given for this helped her with breathing.  Went home on bipap. Which she was begging for before but now she feels like she has to regulate breathing with bipap so she is feeling more comfortable on [place] oxygen. Currently on 6-7 L.   Has had 4 doses of morphine in last day. She is complaining of terrible pain RUQ. She  had this in hospital but was tired of poking/proding and was not able to lay flat. She made the decision not to further evaluate this pain and still wishes to avoid further evaluation. The morphine does seem to help and is keeping her comfortable.  Hasn't moved legs in a few weeks. Swelling is better.  Prednisone did give her energy and made her want to eat. Did resolve gout.  Had issue with constipation. Was given senna which did help. She has been hesitant to take this, but is agreeable to taking more regularly now that she is getting morphine.  Yesterday or day before then started complaining of side being painful. She is nauseated all the time;but not necessarily worse associated with abdominal pain.   Daughter states that in November she was complaining of nausea all the time. she was taking ondansetron on daily basis.   Bp 140/70 Temp 97.7 Sugar: 135 Oxygen: 97% on 7L (was on 9L when she came home).other day o2 wasn't coming out right and she dropped to 50's. She popped right back up 90's. Corrected with tightening water bottle.   A week ago HR went from 60's to 105.   Does seem to rest better after getting morphine.   No longer on lantus or trulicity. Just doing short acting insulin as needed. Sugars were all over the place with prednisone.highest sugar today 160. They adjust insulin based on need. Daughter feels she has a good understanding of amounts needed.  Allergies  Allergen Reactions   Adhesive [Tape] Other (See Comments)  PULLS OFF THE SKIN   Apixaban Other (See Comments)    Internal Bleeding   Aspirin Itching, Rash, Hives and Swelling    Swelling of her tongue   Mirabegron Other (See Comments)    Patient experienced A-Fib   Pineapple Anaphylaxis and Swelling    Throat swells and blisters on tongue and roof of mouth per patient   Metformin Diarrhea and Nausea Only   Tetracycline Hives   Fluticasone-Salmeterol Itching and Rash   Iodinated Contrast Media Itching  and Rash        Lactose Intolerance (Gi) Other (See Comments)    Gas    Latex Itching, Rash and Other (See Comments)    Pulls off the skin and causes welts   Lisinopril Cough   Metronidazole Other (See Comments)    Reaction not known   Sulfa Antibiotics Rash   Sulfonamide Derivatives Itching and Rash   Current Meds  Medication Sig   amLODipine (NORVASC) 2.5 MG tablet TAKE 3 TABLETS(7.5 MG) BY MOUTH DAILY   amoxicillin (AMOXIL) 500 MG tablet Take 2,000 mg by mouth as needed. Prior to Dental Appt   atorvastatin (LIPITOR) 40 MG tablet TAKE 1 TABLET(40 MG) BY MOUTH AT BEDTIME (Patient taking differently: Take 40 mg by mouth at bedtime.)   BD PEN NEEDLE NANO 2ND GEN 32G X 4 MM MISC USE TO INJECT UP TO FOUR TIMES DAILY AS DIRECTED   bisacodyl (DULCOLAX) 5 MG EC tablet Take 10 mg by mouth every 3 (three) days as needed (for constipation).   Bismuth Tribromoph-Petrolatum (XEROFORM PETROLAT PATCH 4"X4") PADS Apply 1 Units topically daily as needed.   Cholecalciferol (VITAMIN D3) 2000 units TABS Take 5,000 Units by mouth daily.   Continuous Blood Gluc Sensor (FREESTYLE LIBRE 14 DAY SENSOR) MISC APPLY NEW SENSOR EVERY 14 DAYS AS DIRECTED (Patient taking differently: 1 each by Other route See admin instructions. APPLY NEW SENSOR EVERY 14 DAYS AS DIRECTED)   fexofenadine (ALLEGRA) 180 MG tablet Take 180 mg by mouth daily.   Glucagon HCl (GLUCAGON EMERGENCY) 1 MG/ML SOLR Inject 1mg  [place] if needed for hypoglycemia. Repeat dose x 1 as needed after 15 minutes.   hyoscyamine (LEVSIN) 0.125 MG tablet Take 1 tablet (0.125 mg total) by mouth daily.   insulin lispro (HUMALOG KWIKPEN) 100 UNIT/ML KwikPen INJECT 5 TO 10 UNITS UNDER THE SKIN THREE TIMES DAILY WITH BREAKFAST, LUNCH AND EVENING MEAL if she is eating and based on how much she is eating. (Patient taking differently: INJECT 13-18 UNITS UNDER THE SKIN THREE TIMES DAILY WITH BREAKFAST, LUNCH AND EVENING MEAL if she is eating and based on how much she is  eating.)   Insulin Syringe-Needle U-100 (INSULIN SYRINGE .3CC/29GX1/2") 29G X 1/2" 0.3 ML MISC Use to inject insulin 4 times a day. (Patient taking differently: 1 each by Other route See admin instructions. Use to inject insulin 4 times a day.)   levothyroxine (SYNTHROID) 112 MCG tablet TAKE 1 TABLET(112 MCG) BY MOUTH DAILY (Patient taking differently: Take 112 mcg by mouth daily before breakfast.)   morphine 10 MG/5ML solution Take 1.3 mLs (2.6 mg total) by mouth every 4 (four) hours as needed for severe pain.   Multiple Vitamin (MULTIVITAMIN) tablet Take 1 tablet by mouth daily.   potassium citrate (UROCIT-K) 10 MEQ (1080 MG) SR tablet Take 20 mEq by mouth 2 (two) times daily.   sertraline (ZOLOFT) 50 MG tablet TAKE 1 TABLET BY MOUTH AT BEDTIME (Patient taking differently: 100 mg. Take 2 daily)   torsemide  40 MG TABS Take 40 mg by mouth 2 (two) times daily.   vitamin B-12 (CYANOCOBALAMIN) 100 MCG tablet Take 100 mcg by mouth daily.   vitamin C (ASCORBIC ACID) 250 MG tablet Take 250 mg by mouth daily.   [DISCONTINUED] acetaminophen (TYLENOL) 325 MG tablet Take 650 mg by mouth in the morning and at bedtime.    Review of Systems  Constitutional:  Positive for fatigue. Negative for chills and fever.  Respiratory:  Positive for shortness of breath (without oxygen, but doing well currently with [place]). Negative for cough, chest tightness and wheezing.   Cardiovascular:  Negative for chest pain, palpitations and leg swelling.  Gastrointestinal:  Positive for abdominal pain and constipation (improved with senna).   Objective:  LMP  (LMP Unknown)       BP Readings from Last 3 Encounters:  08/27/21 (!) 136/30  08/20/21 (!) 128/58  08/18/21 124/60   Wt Readings from Last 3 Encounters:  08/27/21 210 lb 8.6 oz (95.5 kg)  08/18/21 225 lb (102.1 kg)  08/14/21 225 lb (102.1 kg)    EXAM:  GENERAL: she is sleepy, but easily arouses. She smiles frequently. She appears relaxed, comfortable.   HEENT:  atraumatic, conjunctiva clear, no obvious abnormalities on inspection of external nose and ears  NECK: normal movements of the head and neck  LUNGS: on inspection no signs of respiratory distress, breathing rate appears normal, she is on [place] o2.   CV: no obvious cyanosis  MS: moves all visible extremities without noticeable abnormality  PSYCH/NEURO: pleasant and cooperative, no obvious depression or anxiety. She answers questions appropriately, but sleeps between answers.   Assessment/Plan  1. Essential hypertension Bp currently stable. Patient has been refusing taking some of her medications. Bp today is acceptable and she has not had oral medications. Will keep med list the same; continue to monitor vitals regularly through home and hospice and can keep discussion open with patient about taking medications.   2. Paroxysmal atrial fibrillation (HCC) Currently rate controlled. Not on anticoagulation.  3. Chronic respiratory failure with hypoxia (HCC) On [place] o2 at 6-7 liters and is breathing comfortably. Prefers not to use bipap at present time. DNR on hospice.   4. Hypothyroidism, unspecified type Synthroid 112 daily if patient will take medications.   5. Type 2 diabetes mellitus with stage 4 chronic kidney disease, with long-term current use of insulin (HCC) Humalog short acting only prn meals. Will send in glucagon for injection in case of hypoglycemic episode which we discussed today.   6. Chronic kidney disease (CKD) stage G4/A1, severely decreased glomerular filtration rate (GFR) between 15-29 mL/min/1.73 square meter and albuminuria creatinine ratio less than 30 mg/g (HCC) Patient does not want to do dialysis if any worsening renal function. Currently no intervention needed as she is in hospice care.  7. Right upper quadrant abdominal pain Patient does not wish to evaluate this further now. She is comfortable with morphine. Discussed if she changes her mind to let me know. Did  discuss using senna regularly while she is on opiates and risk of constipation is worse.   8. End of life care She is in home hospice care. Discussed goals at this point are keeping her comfortable and honoring her choices for medical care. Keep me posted with concerns.     I discussed the assessment and treatment plan with the patient. The patient was provided an opportunity to ask questions and all were answered. The patient agreed with the plan and demonstrated an  understanding of the instructions.   The patient was advised to call back or seek an in-person evaluation if the symptoms worsen or if the condition fails to improve as anticipated.  I provided 30 minutes of face-to-face time during this encounter.   Micheline Rough, MD

## 2021-09-11 ENCOUNTER — Telehealth: Payer: Self-pay | Admitting: Family Medicine

## 2021-09-11 NOTE — Telephone Encounter (Signed)
Felicia from [facility] call and stated pt daughter want to know if Torsemide 80 mg should be decrease back to 40 mg because she was taking the 40 before she went to the hospital also pt is having burning when she urination the Hospice nurse is getting a UA today also pt complain of right kidney pain .Felicia stated when call just ask for Jonna Clark at [phone].

## 2021-09-12 ENCOUNTER — Telehealth: Payer: Self-pay | Admitting: Family Medicine

## 2021-09-12 ENCOUNTER — Other Ambulatory Visit: Payer: Self-pay | Admitting: Family Medicine

## 2021-09-12 MED ORDER — CEPHALEXIN 500 MG PO CAPS: 500.0000 mg | ORAL_CAPSULE | Freq: Three times a day (TID) | ORAL | 0 refills | Status: AC

## 2021-09-12 NOTE — Telephone Encounter (Signed)
Spoke with the nurse Jonna Clark at [phone] and informed her of the request as below.  Manuela Schwartz stated the tests were sent yesterday, should be at the lab and the triage nurse will fax results once received.  Message sent to PCP.

## 2021-09-12 NOTE — Telephone Encounter (Signed)
Aaron Edelman a Rn with [facility] called because patient is having lower abdominal pain, cloudy urine, and pus from urethra when foley catheter was removed.   Family did have urine analysis done and results should be sent to [facility], as Aaron Edelman does not have access to that, and family does not want to go through weekend without antibiotic.   Please give Aaron Edelman a call on whether antibiotic will be sent in or not   Good callback number is [phone]    Please advise

## 2021-09-12 NOTE — Telephone Encounter (Signed)
Patient's daughter in office. States that catheter had pus draining out. Daughter had what she thought was new urine culture, but this was culture from last visit in office on 12/16 and was treated during hospital stay. Rx for keflex sent in to pharmacy to start with mother. Advised 3 doses today, then twice daily until we get culture results.

## 2021-09-12 NOTE — Telephone Encounter (Signed)
Please make sure they get both UA and culture. Do they haveUA results from yesterday?   When I spoke with patient yesterday, I didn't think she was taking any of her medications very regularly, but I would suggest if she is able to still get urine out with the torsemide 40mg (like they still notice a significant urine release with this) that it would be ok to cut back to alternating 80/40 and try this for a week and make sure swelling not worse with decreased dose before dropping back to 40mg .

## 2021-09-15 ENCOUNTER — Inpatient Hospital Stay: Payer: Medicare Other

## 2021-09-15 ENCOUNTER — Ambulatory Visit: Payer: Medicare Other

## 2021-09-15 ENCOUNTER — Ambulatory Visit: Payer: Medicare Other | Admitting: Hematology & Oncology

## 2021-09-15 NOTE — Telephone Encounter (Signed)
I still haven't seen her culture results. If you are able to get them; please make sure she is sensitive to the keflex I sent in. If not, please let me/covering provider know.   Please check in with daughter and make sure that patient is feeling better. Hopefully she is doing better after a couple of days of antibiotics.

## 2021-09-15 NOTE — Telephone Encounter (Signed)
Spoke with the patient's daughter and informed her of the message below.  [name] stated the patient passed away last night.  Condolences were given and message sent to PCP.

## 2021-09-17 NOTE — Telephone Encounter (Signed)
I called [facility] this morning and someone was supposed to call me back, but they have not done so. I just need details of her death so that I can complete death certificate. I need to know the time of death, and any other details that they can provide surrounding death (I assume respiratory and cardiac failure as those were her diagnoses going into hospice, but I need to know if there is more).  [facility] number: [phone]  Thanks!

## 2021-09-17 NOTE — Telephone Encounter (Signed)
[name] with [facility] called to state patient passed at home 01/08 at 11:49pm. Patient's diagnosis was Congestive Heart Failure (CHF).    If anymore questions, [name] provided the number [phone]    Please advise

## 2021-09-17 NOTE — Telephone Encounter (Signed)
Noted.I spoke with daughter today as well. I had already reached back out to hospice to get that information and completed the death certificate.

## 2021-09-17 NOTE — Telephone Encounter (Signed)
See below

## 2021-10-03 ENCOUNTER — Ambulatory Visit: Payer: Medicare Other | Admitting: Internal Medicine

## 2021-10-08 DEATH — deceased

## 2021-10-22 ENCOUNTER — Ambulatory Visit (HOSPITAL_BASED_OUTPATIENT_CLINIC_OR_DEPARTMENT_OTHER): Payer: Medicare Other | Admitting: Cardiology
